# Patient Record
Sex: Female | Born: 1945 | Race: White | Hispanic: No | Smoking: Former smoker
Health system: Southern US, Community
[De-identification: ages and names within clinical notes are randomized; demographics above are authoritative.]

## PROBLEM LIST (undated history)

## (undated) DIAGNOSIS — J449 Chronic obstructive pulmonary disease, unspecified: Secondary | ICD-10-CM

## (undated) DIAGNOSIS — M1990 Unspecified osteoarthritis, unspecified site: Secondary | ICD-10-CM

## (undated) DIAGNOSIS — J189 Pneumonia, unspecified organism: Secondary | ICD-10-CM

## (undated) DIAGNOSIS — Z8719 Personal history of other diseases of the digestive system: Secondary | ICD-10-CM

## (undated) DIAGNOSIS — K219 Gastro-esophageal reflux disease without esophagitis: Secondary | ICD-10-CM

## (undated) DIAGNOSIS — C3490 Malignant neoplasm of unspecified part of unspecified bronchus or lung: Secondary | ICD-10-CM

## (undated) DIAGNOSIS — T7840XA Allergy, unspecified, initial encounter: Secondary | ICD-10-CM

## (undated) DIAGNOSIS — E785 Hyperlipidemia, unspecified: Secondary | ICD-10-CM

## (undated) DIAGNOSIS — K632 Fistula of intestine: Secondary | ICD-10-CM

## (undated) DIAGNOSIS — H4010X0 Unspecified open-angle glaucoma, stage unspecified: Secondary | ICD-10-CM

## (undated) DIAGNOSIS — G3184 Mild cognitive impairment, so stated: Secondary | ICD-10-CM

## (undated) DIAGNOSIS — F329 Major depressive disorder, single episode, unspecified: Secondary | ICD-10-CM

## (undated) DIAGNOSIS — F32A Depression, unspecified: Secondary | ICD-10-CM

## (undated) DIAGNOSIS — M19041 Primary osteoarthritis, right hand: Secondary | ICD-10-CM

## (undated) DIAGNOSIS — M19042 Primary osteoarthritis, left hand: Secondary | ICD-10-CM

## (undated) DIAGNOSIS — Z87820 Personal history of traumatic brain injury: Secondary | ICD-10-CM

## (undated) DIAGNOSIS — N179 Acute kidney failure, unspecified: Secondary | ICD-10-CM

## (undated) DIAGNOSIS — E118 Type 2 diabetes mellitus with unspecified complications: Secondary | ICD-10-CM

## (undated) DIAGNOSIS — D509 Iron deficiency anemia, unspecified: Secondary | ICD-10-CM

## (undated) DIAGNOSIS — N182 Chronic kidney disease, stage 2 (mild): Secondary | ICD-10-CM

## (undated) DIAGNOSIS — Z5189 Encounter for other specified aftercare: Secondary | ICD-10-CM

## (undated) DIAGNOSIS — E11319 Type 2 diabetes mellitus with unspecified diabetic retinopathy without macular edema: Secondary | ICD-10-CM

## (undated) DIAGNOSIS — F419 Anxiety disorder, unspecified: Secondary | ICD-10-CM

## (undated) DIAGNOSIS — I10 Essential (primary) hypertension: Secondary | ICD-10-CM

## (undated) DIAGNOSIS — I2510 Atherosclerotic heart disease of native coronary artery without angina pectoris: Secondary | ICD-10-CM

## (undated) DIAGNOSIS — I639 Cerebral infarction, unspecified: Secondary | ICD-10-CM

## (undated) DIAGNOSIS — N183 Chronic kidney disease, stage 3 unspecified: Secondary | ICD-10-CM

## (undated) DIAGNOSIS — K5720 Diverticulitis of large intestine with perforation and abscess without bleeding: Secondary | ICD-10-CM

## (undated) DIAGNOSIS — H2513 Age-related nuclear cataract, bilateral: Secondary | ICD-10-CM

## (undated) DIAGNOSIS — Z862 Personal history of diseases of the blood and blood-forming organs and certain disorders involving the immune mechanism: Secondary | ICD-10-CM

## (undated) DIAGNOSIS — I5181 Takotsubo syndrome: Secondary | ICD-10-CM

## (undated) DIAGNOSIS — D649 Anemia, unspecified: Secondary | ICD-10-CM

## (undated) DIAGNOSIS — J302 Other seasonal allergic rhinitis: Secondary | ICD-10-CM

## (undated) DIAGNOSIS — I447 Left bundle-branch block, unspecified: Secondary | ICD-10-CM

## (undated) DIAGNOSIS — M810 Age-related osteoporosis without current pathological fracture: Secondary | ICD-10-CM

## (undated) DIAGNOSIS — I472 Ventricular tachycardia: Secondary | ICD-10-CM

## (undated) DIAGNOSIS — E0590 Thyrotoxicosis, unspecified without thyrotoxic crisis or storm: Secondary | ICD-10-CM

## (undated) HISTORY — DX: Allergy, unspecified, initial encounter: T78.40XA

## (undated) HISTORY — DX: Thyrotoxicosis, unspecified without thyrotoxic crisis or storm: E05.90

## (undated) HISTORY — DX: Type 2 diabetes mellitus with unspecified complications: E11.8

## (undated) HISTORY — PX: APPENDECTOMY: SHX54

## (undated) HISTORY — DX: Primary osteoarthritis, right hand: M19.042

## (undated) HISTORY — DX: Chronic kidney disease, stage 2 (mild): N18.2

## (undated) HISTORY — DX: Pneumonia, unspecified organism: J18.9

## (undated) HISTORY — DX: Primary osteoarthritis, right hand: M19.041

## (undated) HISTORY — DX: Ventricular tachycardia: I47.2

## (undated) HISTORY — DX: Type 2 diabetes mellitus with unspecified diabetic retinopathy without macular edema: E11.319

## (undated) HISTORY — PX: COLONOSCOPY: SHX174

## (undated) HISTORY — DX: Fistula of intestine: K63.2

## (undated) HISTORY — DX: Hyperlipidemia, unspecified: E78.5

## (undated) HISTORY — DX: Malignant neoplasm of unspecified part of unspecified bronchus or lung: C34.90

## (undated) HISTORY — DX: Anxiety disorder, unspecified: F41.9

## (undated) HISTORY — DX: Other seasonal allergic rhinitis: J30.2

## (undated) HISTORY — DX: Left bundle-branch block, unspecified: I44.7

## (undated) HISTORY — DX: Gastro-esophageal reflux disease without esophagitis: K21.9

## (undated) HISTORY — DX: Chronic kidney disease, stage 3 unspecified: N18.30

## (undated) HISTORY — DX: Encounter for other specified aftercare: Z51.89

## (undated) HISTORY — DX: Anemia, unspecified: D64.9

## (undated) HISTORY — DX: Unspecified open-angle glaucoma, stage unspecified: H40.10X0

## (undated) HISTORY — PX: CATARACT EXTRACTION: SUR2

## (undated) HISTORY — DX: Cerebral infarction, unspecified: I63.9

## (undated) HISTORY — DX: Depression, unspecified: F32.A

## (undated) HISTORY — DX: Unspecified osteoarthritis, unspecified site: M19.90

## (undated) HISTORY — DX: Mild cognitive impairment of uncertain or unknown etiology: G31.84

## (undated) HISTORY — PX: CARPAL TUNNEL RELEASE: SHX101

## (undated) HISTORY — DX: Age-related nuclear cataract, bilateral: H25.13

## (undated) HISTORY — DX: Personal history of traumatic brain injury: Z87.820

## (undated) HISTORY — DX: Takotsubo syndrome: I51.81

## (undated) HISTORY — DX: Personal history of diseases of the blood and blood-forming organs and certain disorders involving the immune mechanism: Z86.2

---
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
## 1995-10-25 HISTORY — PX: ABDOMINAL HYSTERECTOMY: SHX81

## 1999-02-05 ENCOUNTER — Encounter: Payer: Self-pay | Admitting: Family Medicine

## 1999-02-05 ENCOUNTER — Ambulatory Visit (HOSPITAL_COMMUNITY): Admission: RE | Admit: 1999-02-05 | Discharge: 1999-02-05 | Payer: Self-pay

## 1999-12-31 ENCOUNTER — Emergency Department (HOSPITAL_COMMUNITY): Admission: EM | Admit: 1999-12-31 | Discharge: 2000-01-01 | Payer: Self-pay | Admitting: *Deleted

## 2000-02-11 ENCOUNTER — Ambulatory Visit (HOSPITAL_COMMUNITY): Admission: RE | Admit: 2000-02-11 | Discharge: 2000-02-11 | Payer: Self-pay | Admitting: *Deleted

## 2000-10-24 HISTORY — PX: COLONOSCOPY: SHX174

## 2000-10-24 HISTORY — PX: CHOLECYSTECTOMY: SHX55

## 2000-10-24 LAB — HM COLONOSCOPY: HM Colonoscopy: NORMAL

## 2000-12-30 ENCOUNTER — Encounter (INDEPENDENT_AMBULATORY_CARE_PROVIDER_SITE_OTHER): Payer: Self-pay

## 2000-12-30 ENCOUNTER — Encounter: Payer: Self-pay | Admitting: Internal Medicine

## 2000-12-30 ENCOUNTER — Encounter: Payer: Self-pay | Admitting: General Surgery

## 2000-12-30 ENCOUNTER — Observation Stay (HOSPITAL_COMMUNITY): Admission: EM | Admit: 2000-12-30 | Discharge: 2000-12-31 | Payer: Self-pay | Admitting: Internal Medicine

## 2001-05-06 ENCOUNTER — Emergency Department (HOSPITAL_COMMUNITY): Admission: EM | Admit: 2001-05-06 | Discharge: 2001-05-06 | Payer: Self-pay | Admitting: Emergency Medicine

## 2001-05-06 ENCOUNTER — Encounter: Payer: Self-pay | Admitting: Emergency Medicine

## 2001-08-09 ENCOUNTER — Observation Stay (HOSPITAL_COMMUNITY): Admission: AD | Admit: 2001-08-09 | Discharge: 2001-08-10 | Payer: Self-pay | Admitting: Cardiology

## 2001-08-09 ENCOUNTER — Encounter: Payer: Self-pay | Admitting: Emergency Medicine

## 2001-12-19 ENCOUNTER — Encounter: Admission: RE | Admit: 2001-12-19 | Discharge: 2001-12-19 | Payer: Self-pay | Admitting: *Deleted

## 2002-01-06 ENCOUNTER — Emergency Department (HOSPITAL_COMMUNITY): Admission: EM | Admit: 2002-01-06 | Discharge: 2002-01-07 | Payer: Self-pay | Admitting: *Deleted

## 2002-03-21 ENCOUNTER — Encounter: Admission: RE | Admit: 2002-03-21 | Discharge: 2002-03-21 | Payer: Self-pay | Admitting: *Deleted

## 2002-05-19 ENCOUNTER — Encounter: Payer: Self-pay | Admitting: Emergency Medicine

## 2002-05-19 ENCOUNTER — Emergency Department (HOSPITAL_COMMUNITY): Admission: EM | Admit: 2002-05-19 | Discharge: 2002-05-19 | Payer: Self-pay | Admitting: Emergency Medicine

## 2002-07-29 ENCOUNTER — Ambulatory Visit (HOSPITAL_COMMUNITY): Admission: RE | Admit: 2002-07-29 | Discharge: 2002-07-29 | Payer: Self-pay | Admitting: *Deleted

## 2002-11-09 IMAGING — CT CT HEAD W/O CM
1 of 2 series · 13 of 30 positions shown, 17 images · IV contrast (agent unspecified)
Comparison: none

FINDINGS
CLINICAL DATA: RIGHT SIDED FACIAL NUMBNESS; HEADACHE.
CRANIAL CT WITHOUT CONTRAST:
ROUTINE NONCONTRAST HEAD CT WAS PERFORMED.  THERE IS NO EVIDENCE OF INTRACRANIAL HEMORRHAGE, BRAIN
EDEMA, OR MASS EFFECT.  THE VENTRICLES ARE NORMAL.  NO EXTRA-AXIAL ABNORMALITIES ARE IDENTIFIED.
BONE WINDOWS SHOW NO SIGNIFICANT ABNORMALITIES.
IMPRESSION
NEGATIVE NONCONTRAST CRANIAL CT.

[Series 2: — · axial · 0.39mm/px · z∈[-179,-64]mm · 13 of 27 slices shown, 17 images]
[im 2/27  brain]
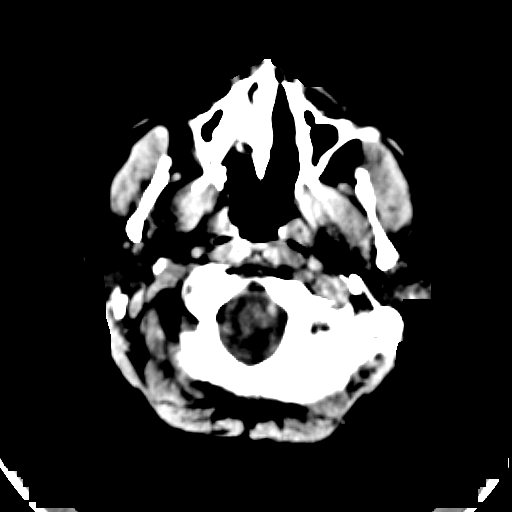
[im 2/27  bone]
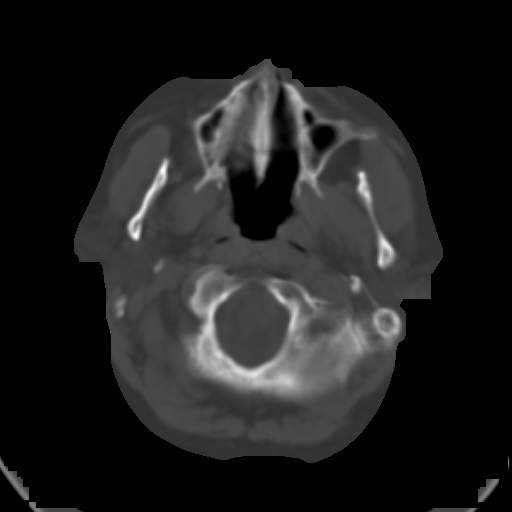
[im 4/27  brain]
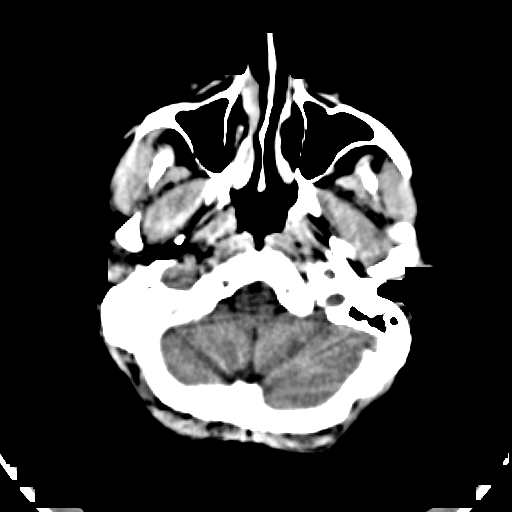
[im 6/27  brain]
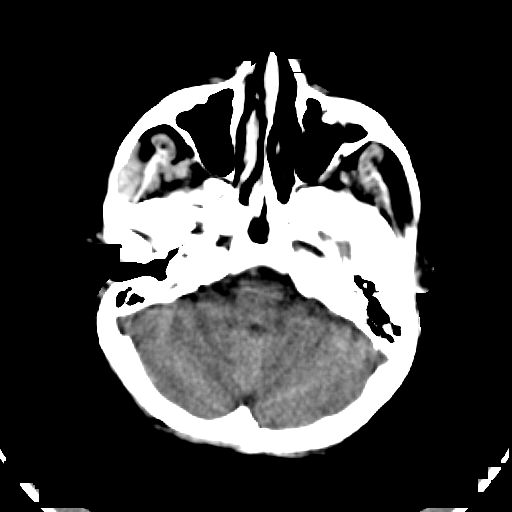
[im 8/27  brain]
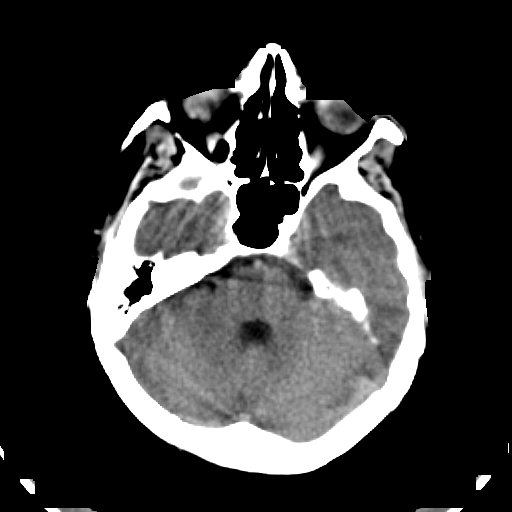
[im 10/27  brain]
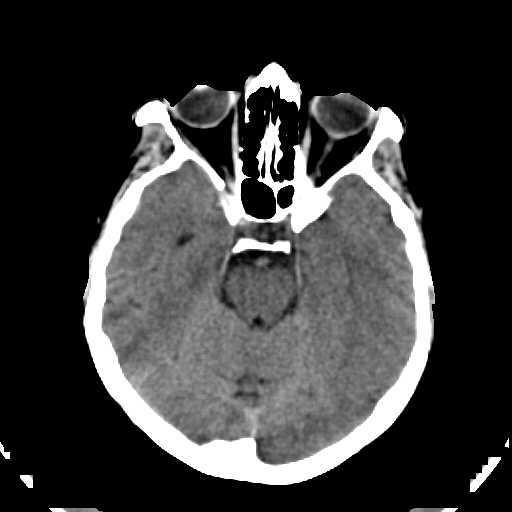
[im 10/27  bone]
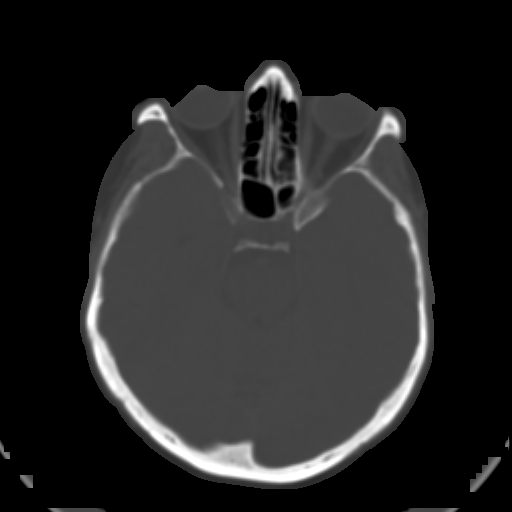
[im 12/27  brain]
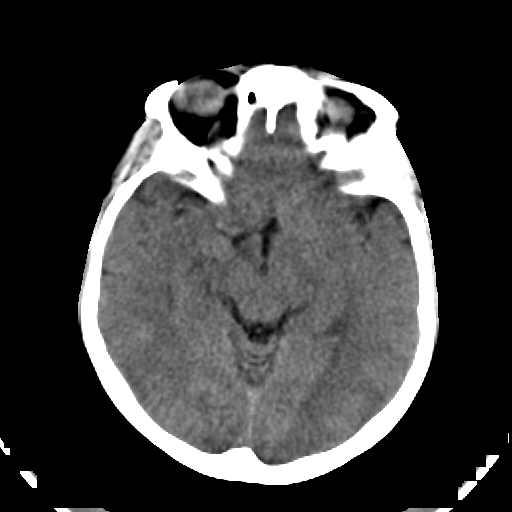
[im 14/27  brain]
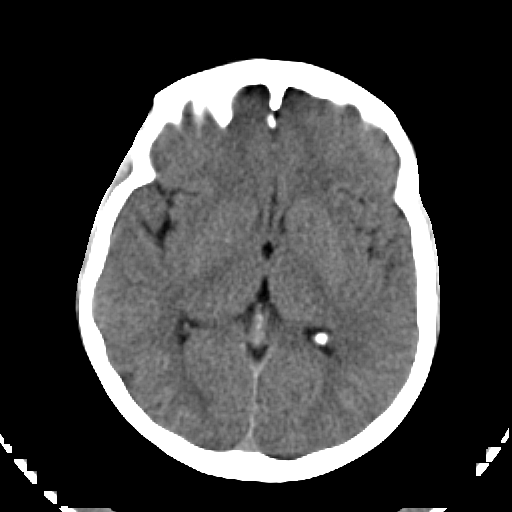
[im 15/27  brain]
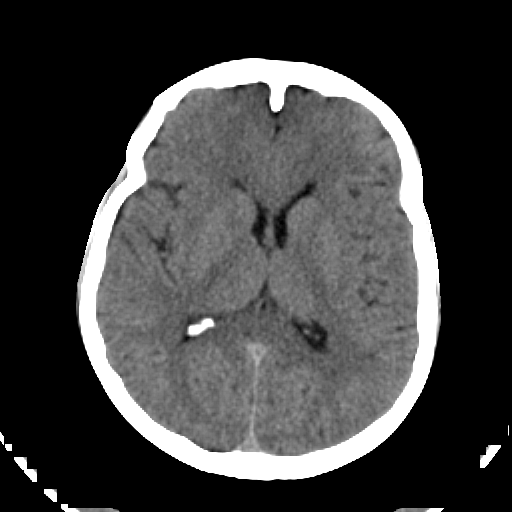
[im 17/27  brain]
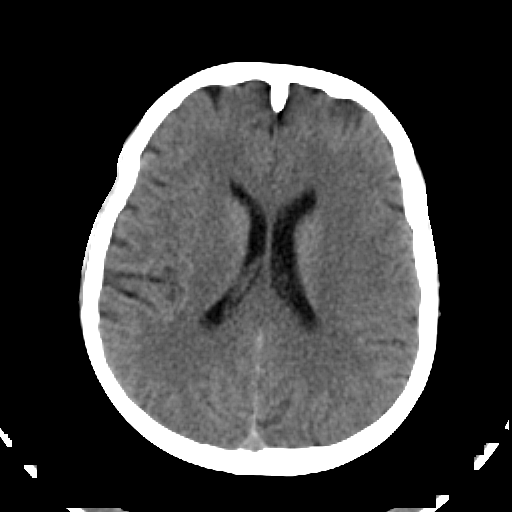
[im 17/27  bone]
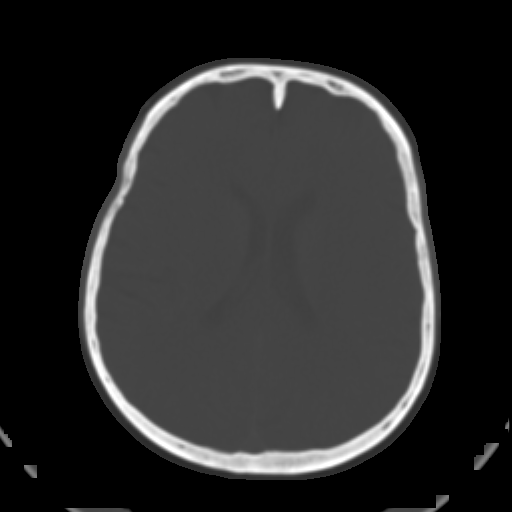
[im 19/27  brain]
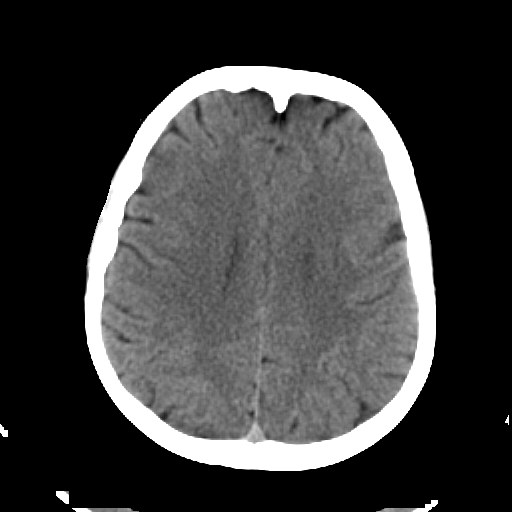
[im 21/27  brain]
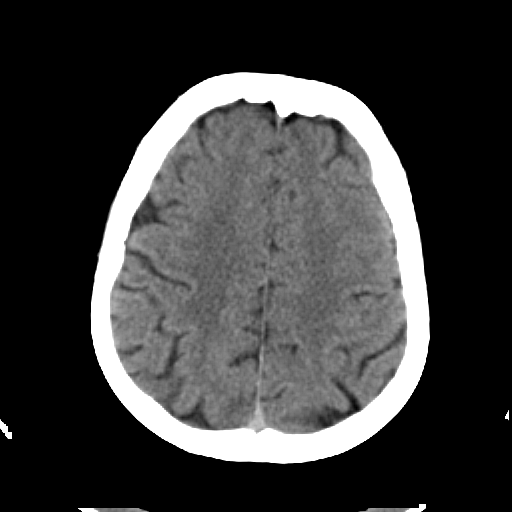
[im 23/27  brain]
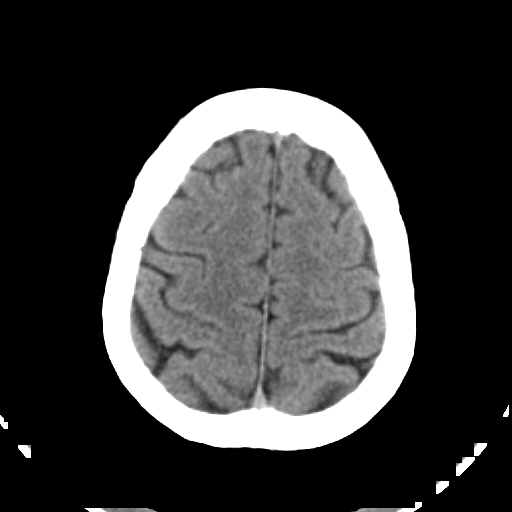
[im 25/27  brain]
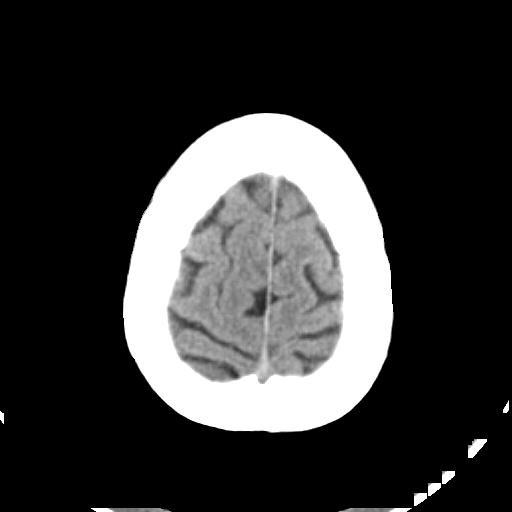
[im 25/27  bone]
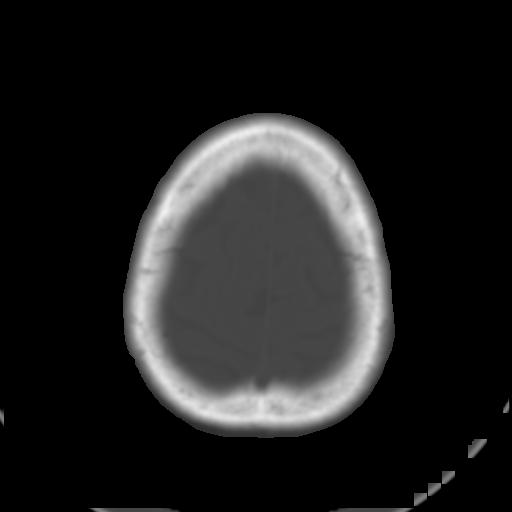

[13 of 30 positions shown; findings below may reference images not displayed]

## 2003-03-26 ENCOUNTER — Encounter: Payer: Self-pay | Admitting: Family Medicine

## 2003-03-26 ENCOUNTER — Encounter: Admission: RE | Admit: 2003-03-26 | Discharge: 2003-03-26 | Payer: Self-pay | Admitting: Family Medicine

## 2004-03-27 ENCOUNTER — Emergency Department (HOSPITAL_COMMUNITY): Admission: EM | Admit: 2004-03-27 | Discharge: 2004-03-27 | Payer: Self-pay | Admitting: Emergency Medicine

## 2004-05-21 ENCOUNTER — Encounter: Admission: RE | Admit: 2004-05-21 | Discharge: 2004-05-21 | Payer: Self-pay | Admitting: Internal Medicine

## 2004-09-03 ENCOUNTER — Ambulatory Visit: Payer: Self-pay | Admitting: Internal Medicine

## 2004-09-10 ENCOUNTER — Ambulatory Visit: Payer: Self-pay | Admitting: Internal Medicine

## 2004-09-22 ENCOUNTER — Encounter: Admission: RE | Admit: 2004-09-22 | Discharge: 2004-12-21 | Payer: Self-pay | Admitting: Internal Medicine

## 2004-10-22 ENCOUNTER — Emergency Department (HOSPITAL_COMMUNITY): Admission: EM | Admit: 2004-10-22 | Discharge: 2004-10-22 | Payer: Self-pay | Admitting: Emergency Medicine

## 2004-10-22 ENCOUNTER — Ambulatory Visit: Payer: Self-pay | Admitting: Internal Medicine

## 2004-10-29 ENCOUNTER — Ambulatory Visit: Payer: Self-pay | Admitting: Internal Medicine

## 2004-12-02 ENCOUNTER — Ambulatory Visit: Payer: Self-pay | Admitting: Internal Medicine

## 2004-12-09 ENCOUNTER — Ambulatory Visit: Payer: Self-pay | Admitting: Internal Medicine

## 2005-01-28 IMAGING — CR DG CHEST 1V PORT
1 series · 1 of 1 positions shown · non-contrast
Comparison: none

CLINICAL DATA: Chest pain and nausea. 
 PORTABLE CHEST, SINGLE VIEW ? 03/27/04 (6664 HOURS)
 No prior studies for comparison. 
 No evidence of infiltrate, edema or pleural fluid.  Minimal subsegmental atelectasis at the right lung base. Heart size is normal.  
 IMPRESSION
 No active disease. Minimal right basilar atelectasis.

[view not recorded]
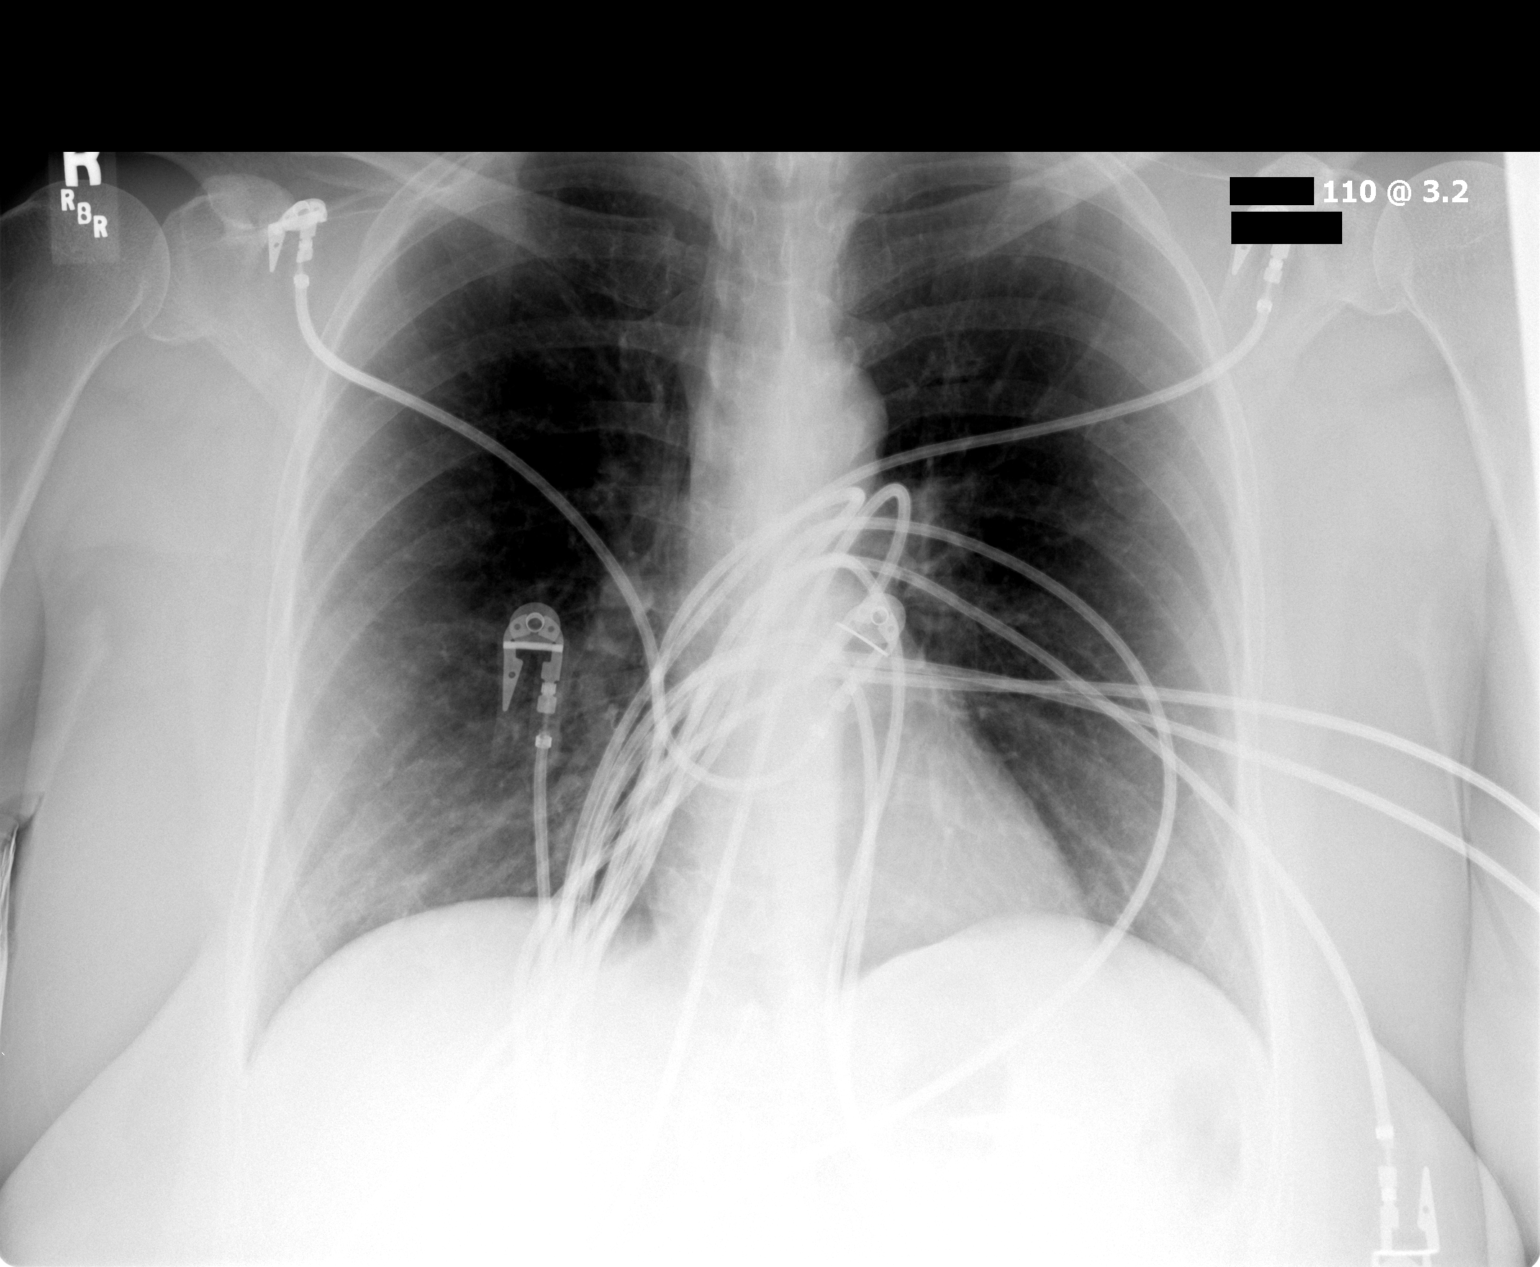

[1 of 1 positions shown; findings below may reference images not displayed]

## 2005-03-11 ENCOUNTER — Ambulatory Visit: Payer: Self-pay | Admitting: Internal Medicine

## 2005-03-18 ENCOUNTER — Ambulatory Visit: Payer: Self-pay | Admitting: Internal Medicine

## 2005-05-06 ENCOUNTER — Ambulatory Visit: Payer: Self-pay | Admitting: Internal Medicine

## 2005-05-13 ENCOUNTER — Ambulatory Visit: Payer: Self-pay | Admitting: Internal Medicine

## 2005-07-01 ENCOUNTER — Emergency Department (HOSPITAL_COMMUNITY): Admission: EM | Admit: 2005-07-01 | Discharge: 2005-07-01 | Payer: Self-pay | Admitting: Emergency Medicine

## 2005-07-08 ENCOUNTER — Ambulatory Visit: Payer: Self-pay | Admitting: Internal Medicine

## 2005-07-18 ENCOUNTER — Ambulatory Visit: Payer: Self-pay | Admitting: Internal Medicine

## 2005-07-22 ENCOUNTER — Ambulatory Visit: Payer: Self-pay

## 2005-07-22 ENCOUNTER — Encounter: Payer: Self-pay | Admitting: Internal Medicine

## 2005-07-27 ENCOUNTER — Ambulatory Visit: Payer: Self-pay | Admitting: Internal Medicine

## 2005-08-01 ENCOUNTER — Ambulatory Visit: Payer: Self-pay | Admitting: Cardiology

## 2005-08-04 ENCOUNTER — Ambulatory Visit: Payer: Self-pay | Admitting: Cardiology

## 2005-08-04 ENCOUNTER — Inpatient Hospital Stay (HOSPITAL_BASED_OUTPATIENT_CLINIC_OR_DEPARTMENT_OTHER): Admission: RE | Admit: 2005-08-04 | Discharge: 2005-08-04 | Payer: Self-pay | Admitting: Cardiology

## 2005-08-09 ENCOUNTER — Ambulatory Visit: Payer: Self-pay | Admitting: Internal Medicine

## 2005-09-15 ENCOUNTER — Emergency Department (HOSPITAL_COMMUNITY): Admission: EM | Admit: 2005-09-15 | Discharge: 2005-09-16 | Payer: Self-pay | Admitting: Emergency Medicine

## 2005-09-19 ENCOUNTER — Ambulatory Visit: Payer: Self-pay | Admitting: Internal Medicine

## 2005-09-28 ENCOUNTER — Ambulatory Visit: Payer: Self-pay | Admitting: Internal Medicine

## 2005-09-30 ENCOUNTER — Ambulatory Visit: Payer: Self-pay | Admitting: Gastroenterology

## 2005-10-14 ENCOUNTER — Ambulatory Visit: Payer: Self-pay | Admitting: Gastroenterology

## 2005-10-26 ENCOUNTER — Ambulatory Visit: Payer: Self-pay | Admitting: Gastroenterology

## 2005-10-28 ENCOUNTER — Ambulatory Visit (HOSPITAL_COMMUNITY): Admission: RE | Admit: 2005-10-28 | Discharge: 2005-10-28 | Payer: Self-pay | Admitting: Gastroenterology

## 2005-11-08 ENCOUNTER — Ambulatory Visit: Payer: Self-pay | Admitting: Gastroenterology

## 2005-11-11 ENCOUNTER — Ambulatory Visit: Payer: Self-pay | Admitting: Internal Medicine

## 2005-11-11 ENCOUNTER — Ambulatory Visit (HOSPITAL_COMMUNITY): Admission: RE | Admit: 2005-11-11 | Discharge: 2005-11-11 | Payer: Self-pay | Admitting: Gastroenterology

## 2005-11-28 ENCOUNTER — Ambulatory Visit: Payer: Self-pay | Admitting: Internal Medicine

## 2005-12-02 ENCOUNTER — Ambulatory Visit: Payer: Self-pay | Admitting: Internal Medicine

## 2005-12-26 ENCOUNTER — Ambulatory Visit: Payer: Self-pay | Admitting: Gastroenterology

## 2006-01-03 ENCOUNTER — Ambulatory Visit: Payer: Self-pay | Admitting: Internal Medicine

## 2006-01-13 ENCOUNTER — Ambulatory Visit: Payer: Self-pay | Admitting: Internal Medicine

## 2006-02-24 ENCOUNTER — Ambulatory Visit: Payer: Self-pay | Admitting: Internal Medicine

## 2006-03-03 ENCOUNTER — Ambulatory Visit: Payer: Self-pay | Admitting: Internal Medicine

## 2006-04-14 ENCOUNTER — Ambulatory Visit: Payer: Self-pay | Admitting: Internal Medicine

## 2006-05-04 IMAGING — CT CT ABDOMEN W/ CM
1 of 3 series · 14 of 32 positions shown, 19 images · IV contrast (APPLIED)
Comparison: none

CLINICAL DATA: Right lower abdominal and pelvic pain.   Diarrhea.  Suspect appendicitis.  
ABDOMEN CT WITH CONTRAST:
TECHNIQUE: Multidetector CT imaging of the abdomen was performed following the standard protocol during bolus administration of intravenous contrast.
Contrast:  100 cc Omnipaque 300
Surgical clips are seen from prior cholecystectomy.   There is no evidence of biliary ductal dilatation.   Mild diffuse fatty infiltration of the liver is noted.  No focal liver lesions are seen. 
The pancreas, spleen, adrenal glands, and kidneys are normal in appearance.   There is no evidence of mass or adenopathy.  There is no evidence of inflammatory process or abnormal fluid collections within the abdomen.
TECHNIQUE: Multidetector CT imaging of the pelvis was performed following the standard protocol during bolus administration of intravenous contrast.
The appendix is seen and is normal in appearance.   There is no evidence of inflammatory process or abnormal fluid collections within the pelvis.   No mass or adenopathy identified.  Patient has undergone previous hysterectomy.

[Series 2: abd/pelv with 5.0 b31f st · axial · 0.74mm/px · z∈[-629,-254]mm · 14 of 85 slices shown, 19 images]
[im 5/85  soft-tissue]
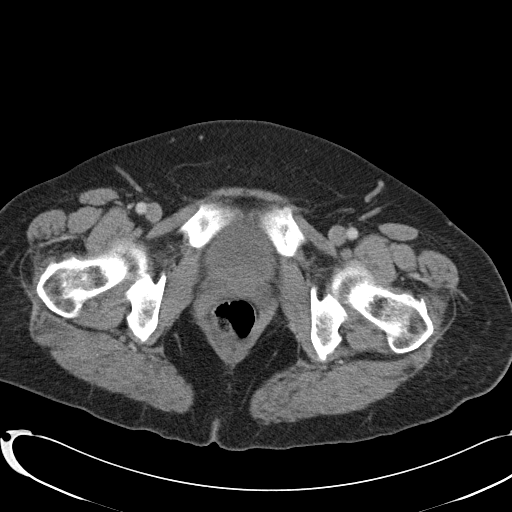
[im 5/85  bone]
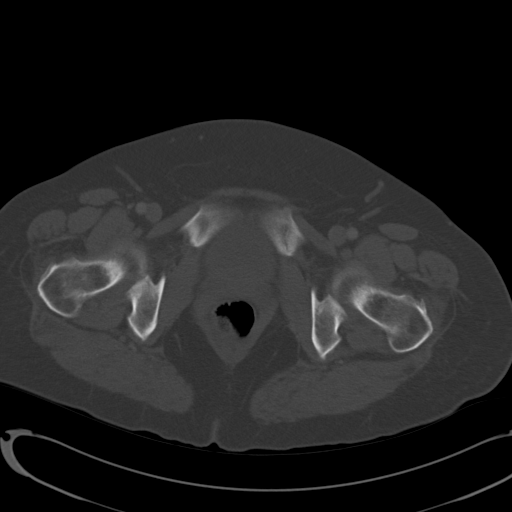
[im 14/85  soft-tissue]
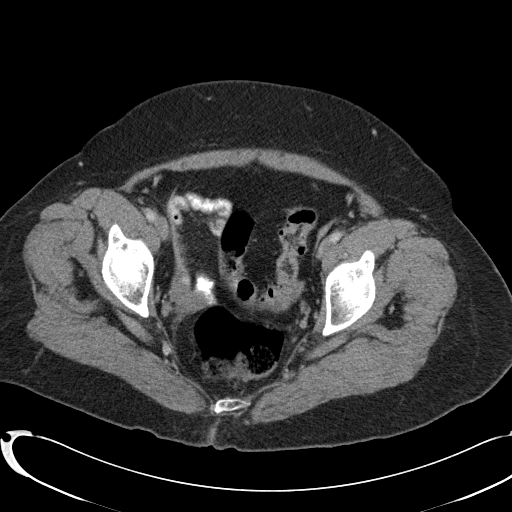
[im 18/85  soft-tissue]
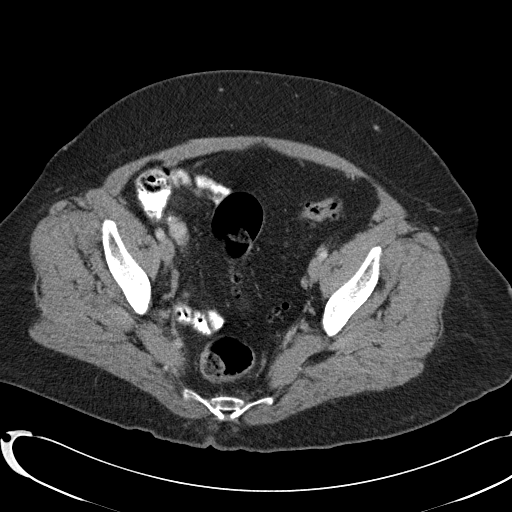
[im 23/85  soft-tissue]
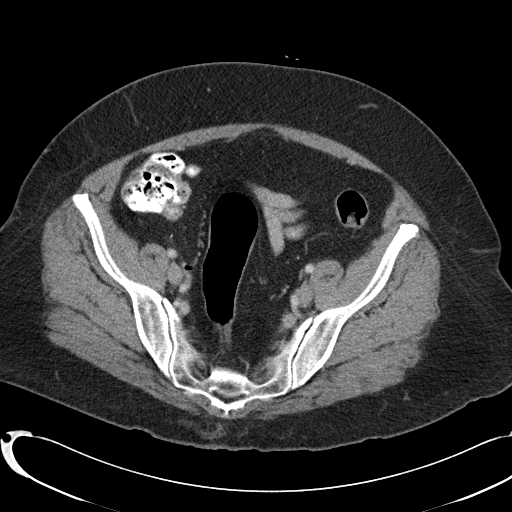
[im 31/85  soft-tissue]
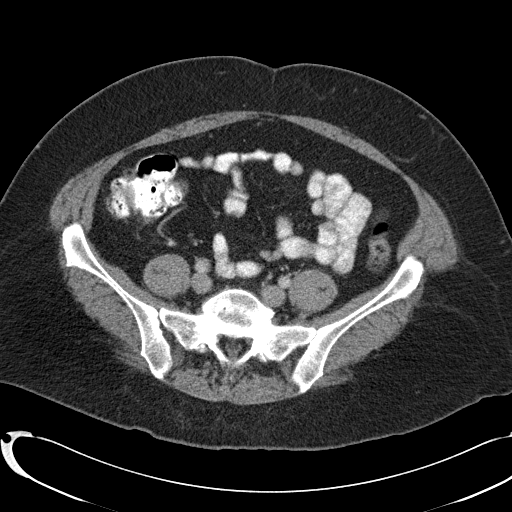
[im 36/85  soft-tissue]
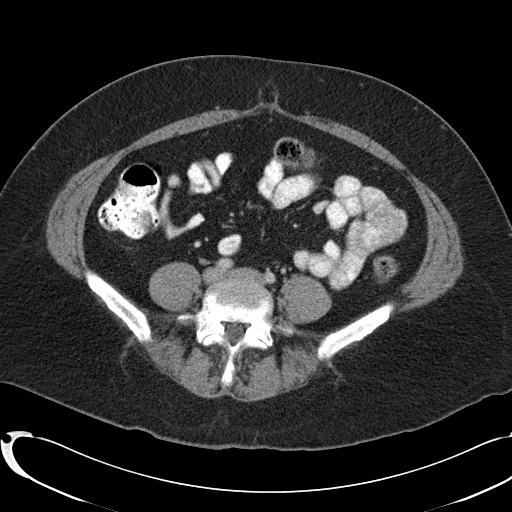
[im 45/85  soft-tissue]
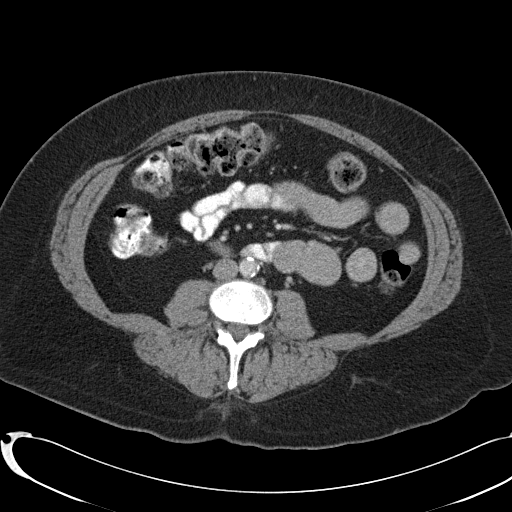
[im 49/85  soft-tissue]
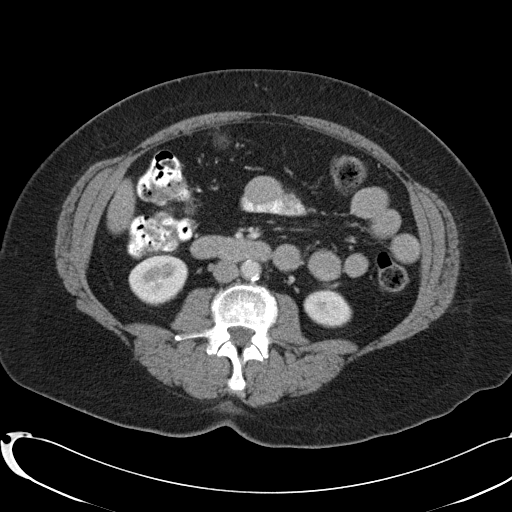
[im 54/85  soft-tissue]
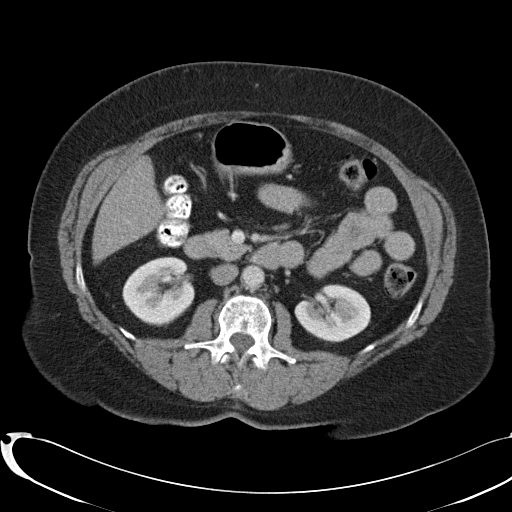
[im 54/85  bone]
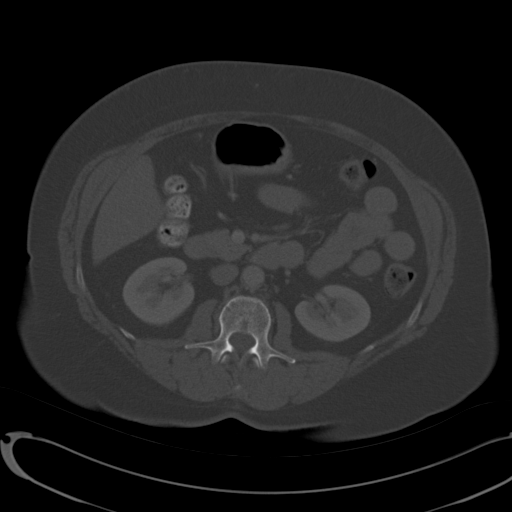
[im 62/85  soft-tissue]
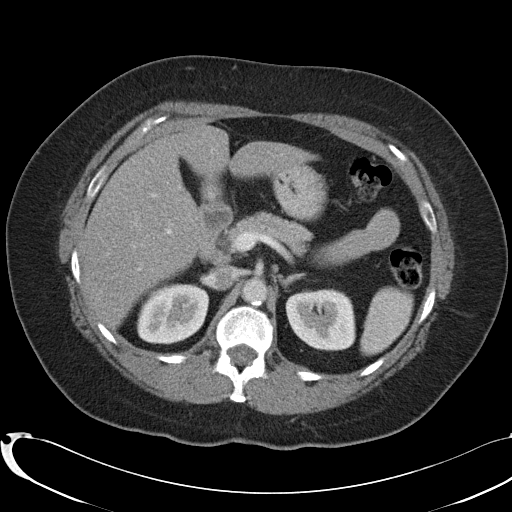
[im 67/85  soft-tissue]
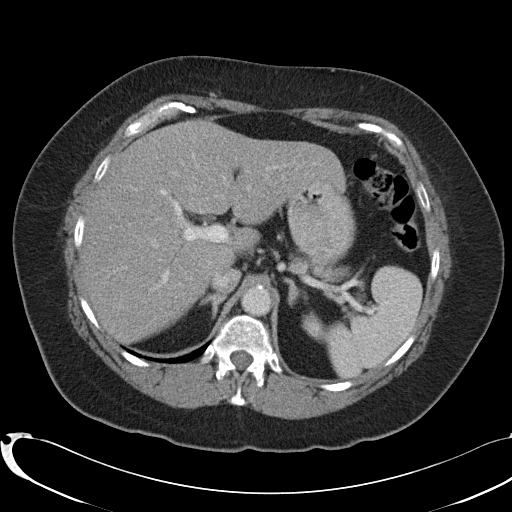
[im 67/85  lung]
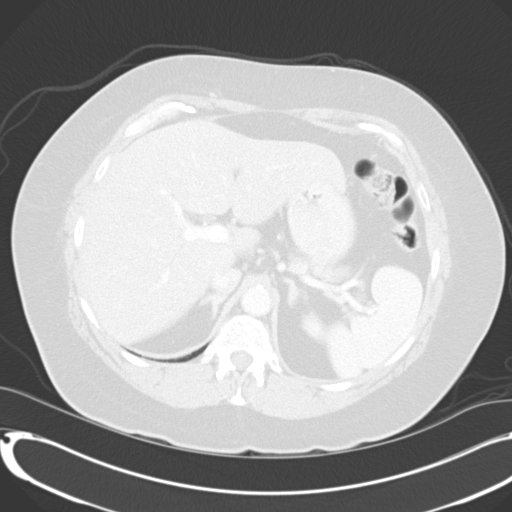
[im 71/85  soft-tissue]
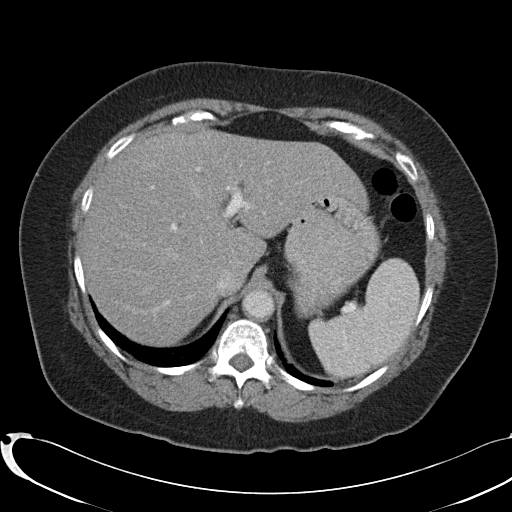
[im 71/85  lung]
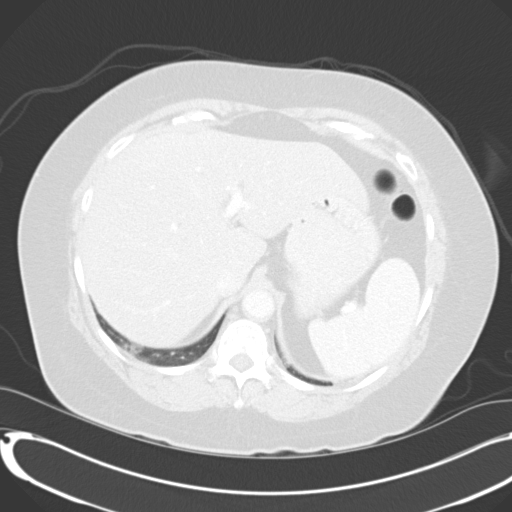
[im 76/85  lung]
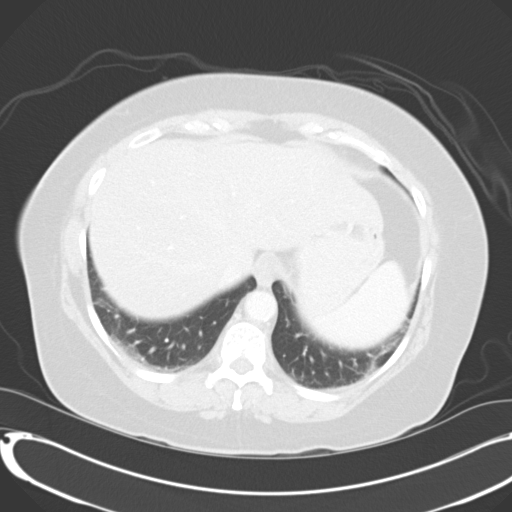
[im 80/85  soft-tissue]
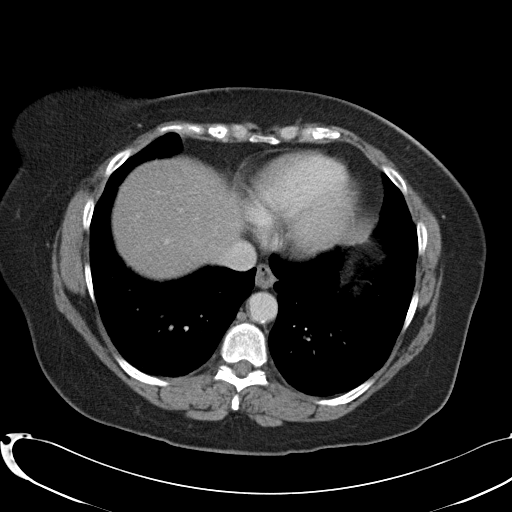
[im 80/85  lung]
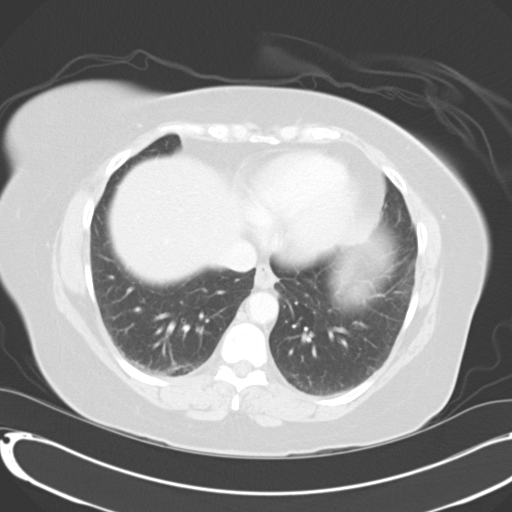

[14 of 32 positions shown; findings below may reference images not displayed]

IMPRESSION: 1.  No acute findings.  
2.  Mild diffuse fatty infiltration of the liver.   
PELVIS CT WITH CONTRAST:
IMPRESSION: Negative pelvis CT.  No evidence of appendicitis or other acute findings.

## 2006-05-29 ENCOUNTER — Ambulatory Visit: Payer: Self-pay | Admitting: Internal Medicine

## 2006-06-20 ENCOUNTER — Ambulatory Visit: Payer: Self-pay | Admitting: Internal Medicine

## 2006-07-17 ENCOUNTER — Ambulatory Visit: Payer: Self-pay | Admitting: Internal Medicine

## 2006-07-20 IMAGING — CT CT PELVIS W/ CM
2 of 5 series · 17 of 46 positions shown, 19 images · IV contrast (omnipaque)
Comparison: 07/01/2005

ABDOMEN CT WITH CONTRAST

CLINICAL DATA: Right-sided abdominal pain for months
TECHNIQUE: Multidetector CT imaging of the abdomen and pelvis was performed
following the standard protocol during bolus administration of intravenous
contrast.

Contrast:  100 cc Omnipaque 300

[Series 2: abd_pel 5.0 b40f st · axial · 0.65mm/px · z∈[-346,+64]mm · 14 of 92 slices shown, 16 images]
[im 5/92  soft-tissue]
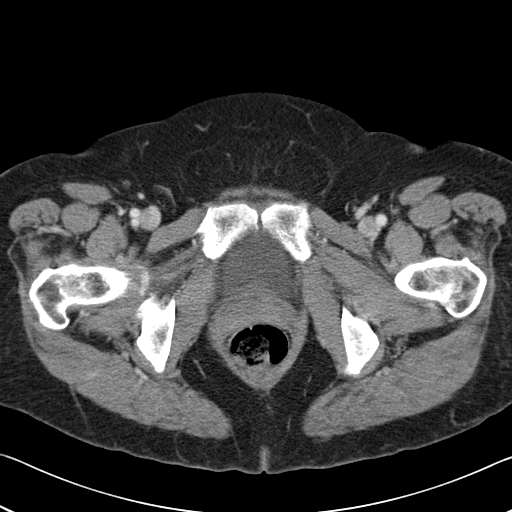
[im 5/92  bone]
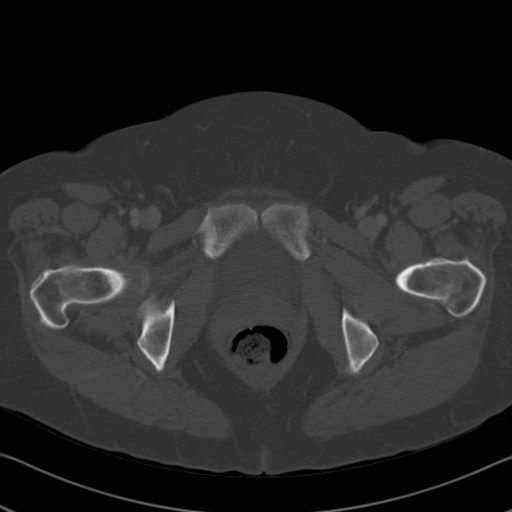
[im 14/92  soft-tissue]
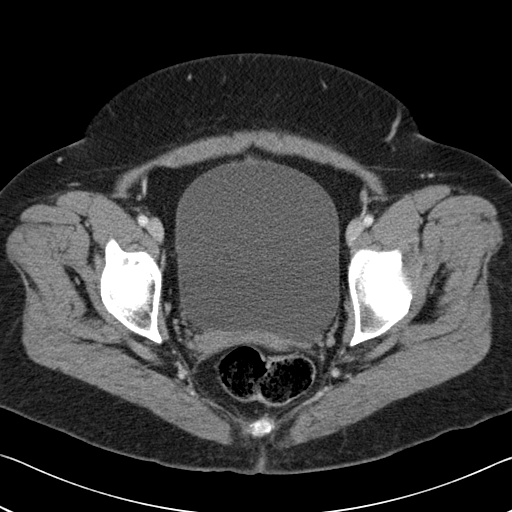
[im 19/92  soft-tissue]
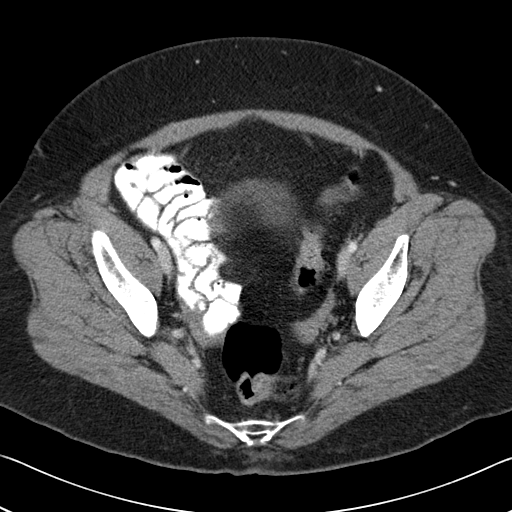
[im 23/92  soft-tissue]
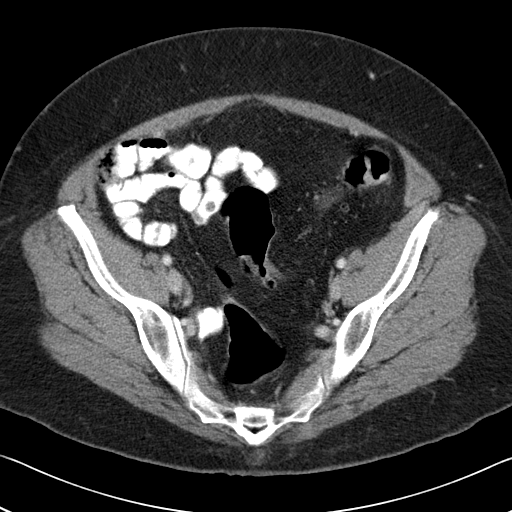
[im 32/92  soft-tissue]
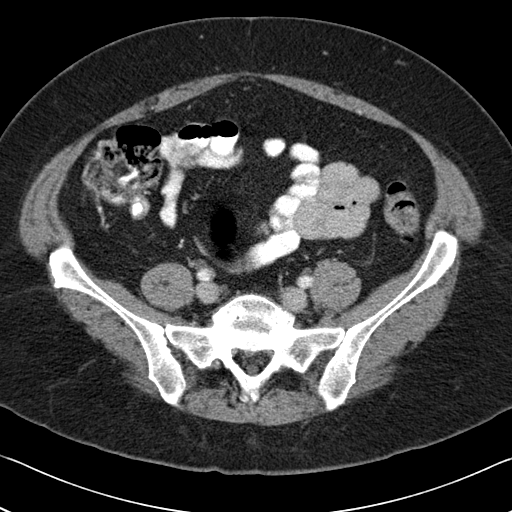
[im 37/92  soft-tissue]
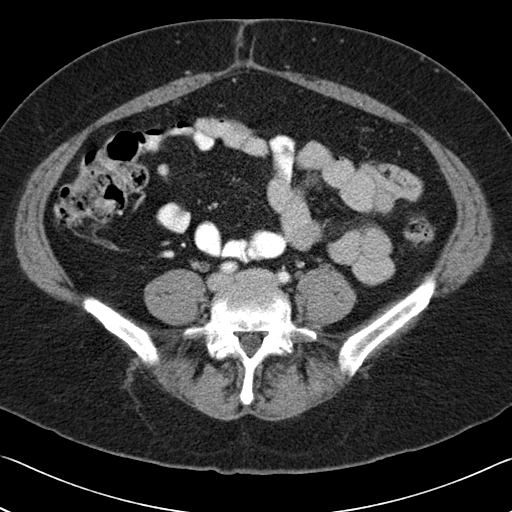
[im 41/92  soft-tissue]
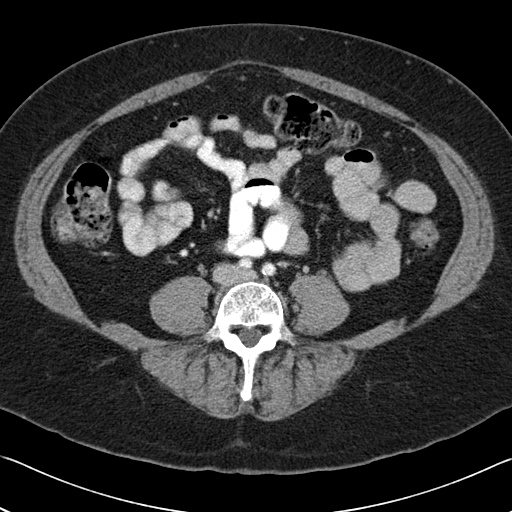
[im 51/92  soft-tissue]
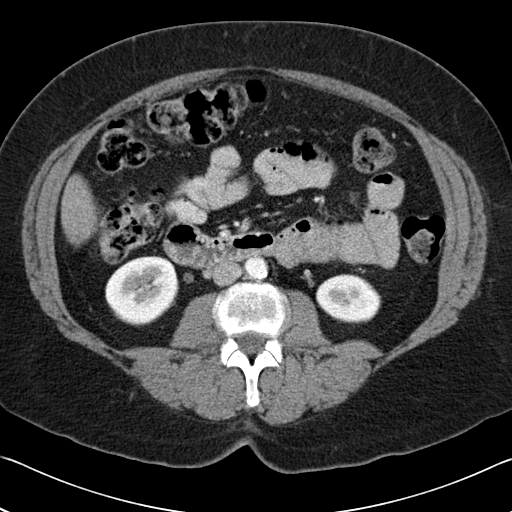
[im 55/92  soft-tissue]
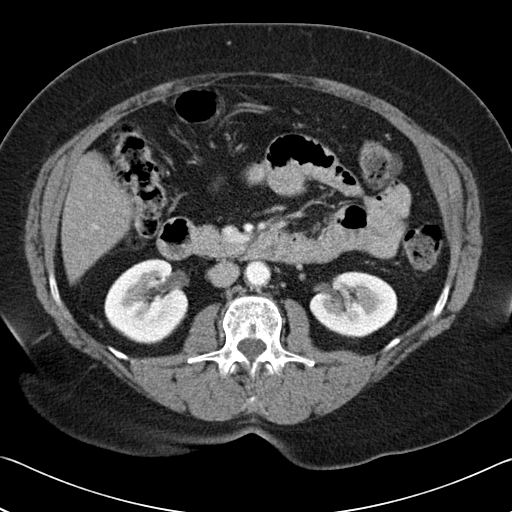
[im 55/92  bone]
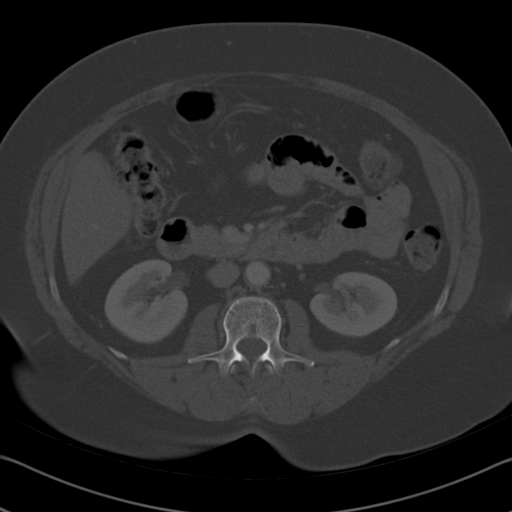
[im 60/92  soft-tissue]
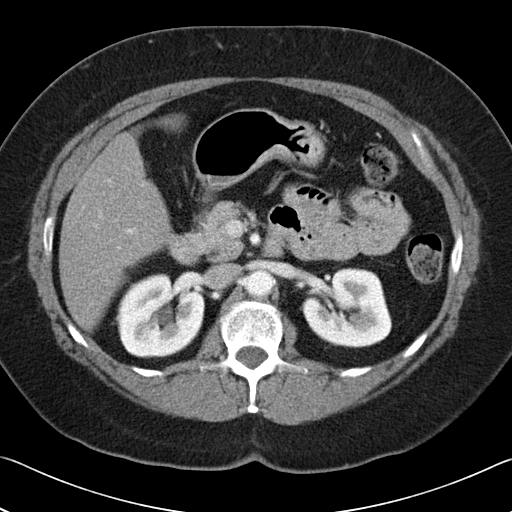
[im 69/92  soft-tissue]
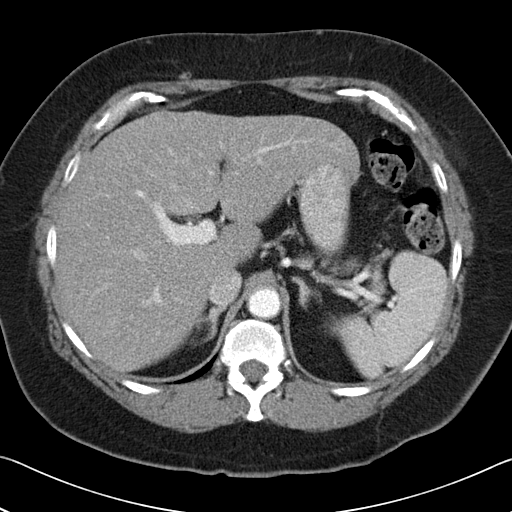
[im 73/92  soft-tissue]
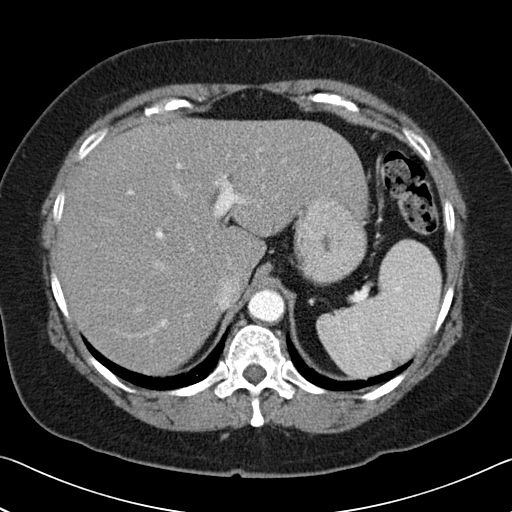
[im 78/92  soft-tissue]
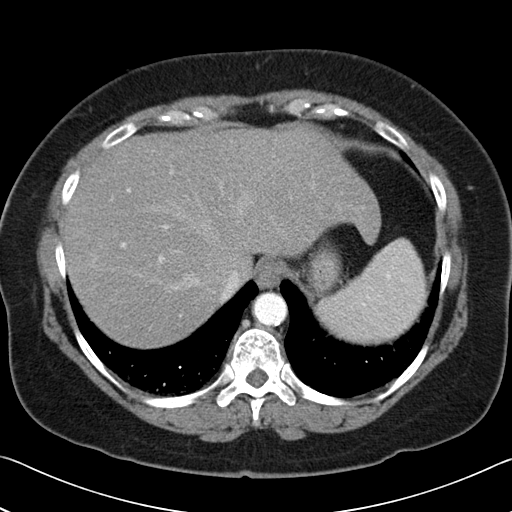
[im 87/92  soft-tissue]
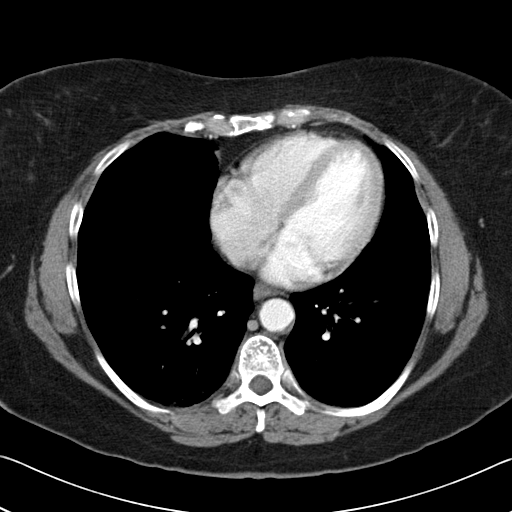

[Series 603: <mpr thick range(1)> · coronal · 0.93mm/px · 3 of 46 slices shown]
[im 16/46  soft-tissue]
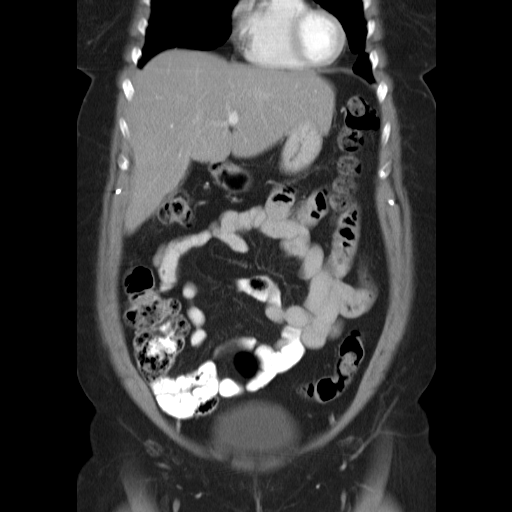
[im 21/46  soft-tissue]
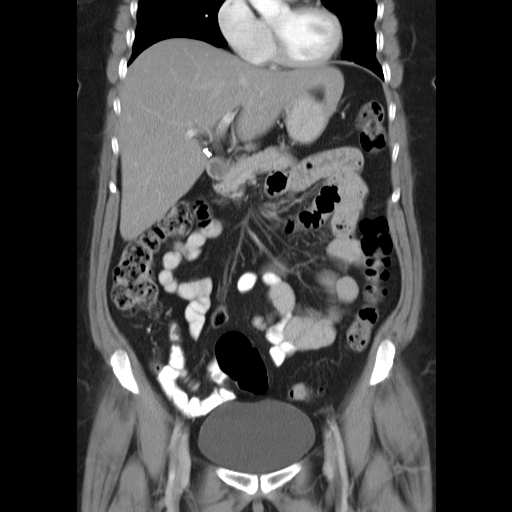
[im 26/46  soft-tissue]
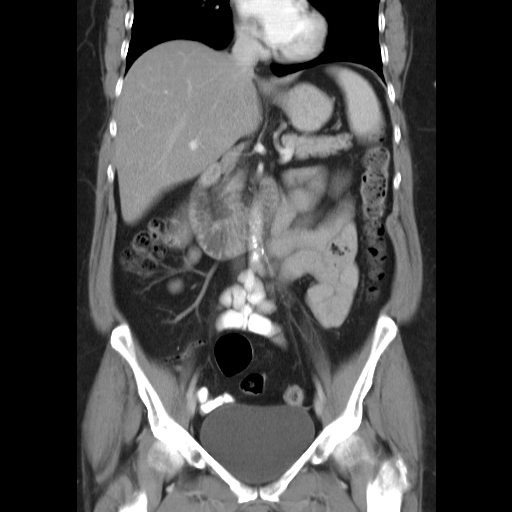

[17 of 46 positions shown; findings below may reference images not displayed]

FINDINGS: Patient is status post cholecystectomy. There is diffuse fatty
infiltration of the liver. Small cysts in the left kidney. Otherwise solid
organs unremarkable. No free fluid, free air, or adenopathy. Bowel unremarkable.
Atherosclerotic calcifications in the abdominal aorta.

IMPRESSION

Cholecystectomy. Fatty liver. No acute findings.

PELVIS CT WITH CONTRAST
FINDINGS: There are 3 rounded filling defect seen within small bowel loops in
the pelvis. Although these may represent food material or ingested tablets, its
difficult to exclude small bowel polyps. These areas of bowel were not well
distended or opacified on prior study.

There is sigmoid diverticulosis. No evidence of active diverticulitis. No free
fluid, free air, or adenopathy.

IMPRESSION

Three rounded filling defects within pelvic small bowel loops, ingested material
versus small bowel polyps. These can be further evaluated either with capsule
endoscopy or small bowel follow-through. 

Sigmoid diverticulosis.

## 2006-08-15 ENCOUNTER — Ambulatory Visit: Payer: Self-pay | Admitting: Internal Medicine

## 2006-08-15 LAB — CONVERTED CEMR LAB
ALT: 23 units/L (ref 0–40)
AST: 22 units/L (ref 0–37)
Albumin: 4.2 g/dL (ref 3.5–5.2)
Alkaline Phosphatase: 105 units/L (ref 39–117)
BUN: 14 mg/dL (ref 6–23)
Bilirubin, Direct: 0.1 mg/dL (ref 0.0–0.3)
CO2: 32 meq/L (ref 19–32)
Calcium: 10 mg/dL (ref 8.4–10.5)
Chloride: 106 meq/L (ref 96–112)
Chol/HDL Ratio, serum: 10.9
Cholesterol: 284 mg/dL (ref 0–200)
Creatinine, Ser: 0.8 mg/dL (ref 0.4–1.2)
GFR calc non Af Amer: 78 mL/min
Glomerular Filtration Rate, Af Am: 95 mL/min/{1.73_m2}
Glucose, Bld: 141 mg/dL — ABNORMAL HIGH (ref 70–99)
HDL: 26.1 mg/dL — ABNORMAL LOW (ref >39.0)
Hgb A1c MFr Bld: 8 % — ABNORMAL HIGH (ref 4.6–6.0)
LDL DIRECT: 192.4 mg/dL
Potassium: 5.2 meq/L — ABNORMAL HIGH (ref 3.5–5.1)
Sodium: 144 meq/L (ref 135–145)
Total Bilirubin: 0.6 mg/dL (ref 0.3–1.2)
Total Protein: 6.9 g/dL (ref 6.0–8.3)
Triglyceride fasting, serum: 327 mg/dL (ref 0–149)
VLDL: 65 mg/dL — ABNORMAL HIGH (ref 0–40)

## 2006-08-24 ENCOUNTER — Ambulatory Visit: Payer: Self-pay | Admitting: Internal Medicine

## 2006-08-31 IMAGING — CR DG SMALL BOWEL
5 series · 5 of 5 positions shown · non-contrast
Comparison: none

CLINICAL DATA: Abdominal pain.  Questionable filling defects seen on CT scan of 09/16/05.
SMALL BOWEL SERIES:
Scout film prior to the ingestion of contrast material by the patient shows metallic clips in the right upper quadrant of the abdomen consistent with previous cholecystectomy.  There is no evidence of obstruction or perforation of the bowel.  There is no evidence of mass or abnormal calcification is seen.  
The patient ingested contrast material which was seen to pass freely through the stomach and entire small bowel.  The small bowel showed normal position and diameter with no evidence of obstruction.  There were areas of lucency within the bowel most of which appear to be areas of air.  There are at least four to five areas which are suspicious for small nonobstructive small bowel polyps which do  not appear to be associated with obstruction, adhesions, or intussusception.  The terminal ileum appears normal.

[view not recorded (1 of 5)]
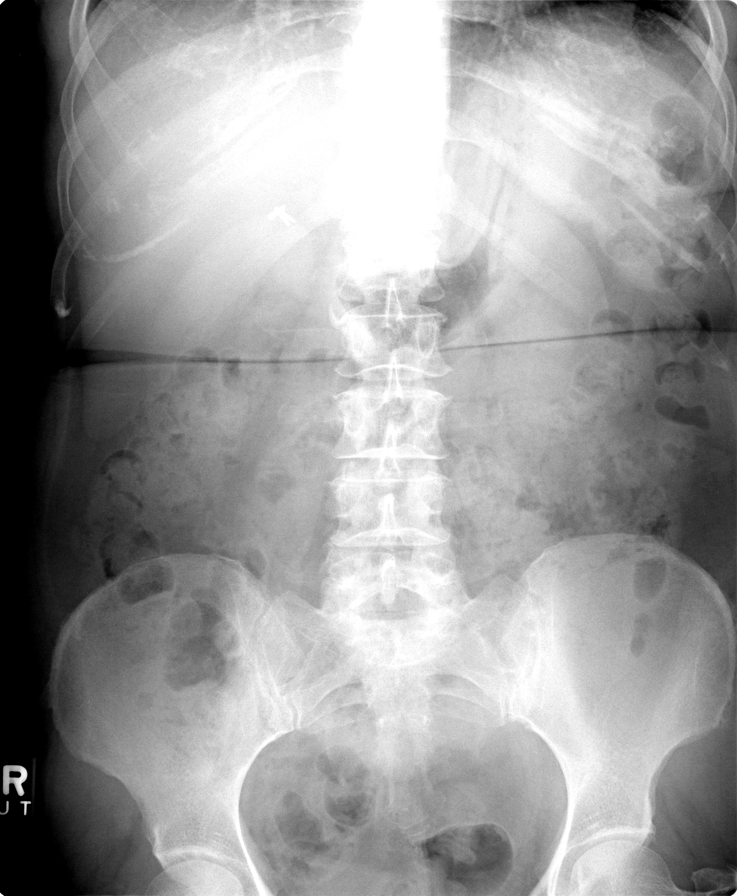

[view not recorded (2 of 5)]
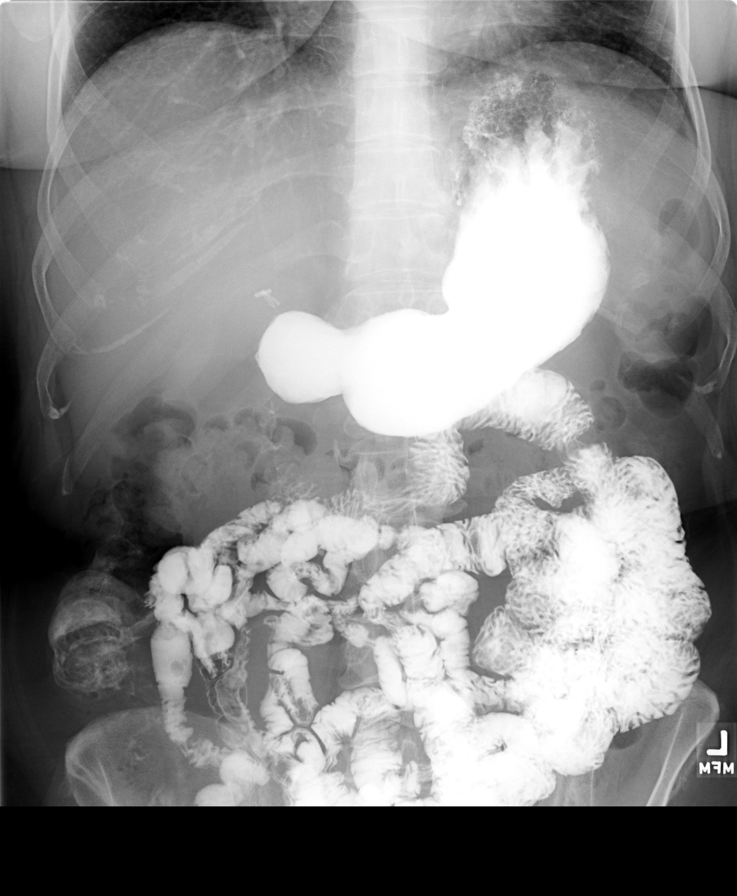

[view not recorded (3 of 5)]
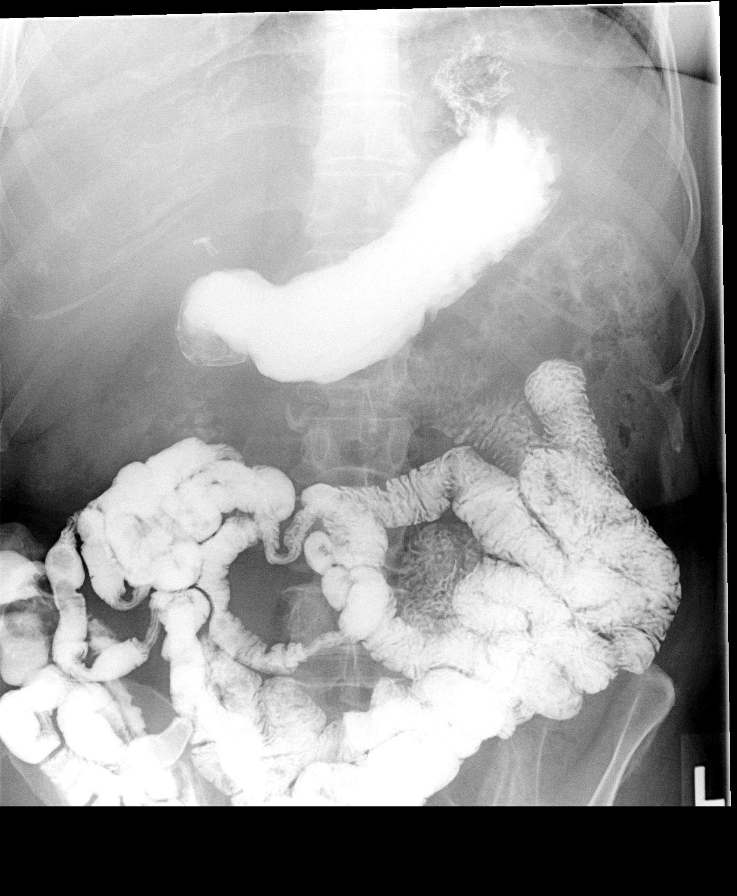

[view not recorded (4 of 5)]
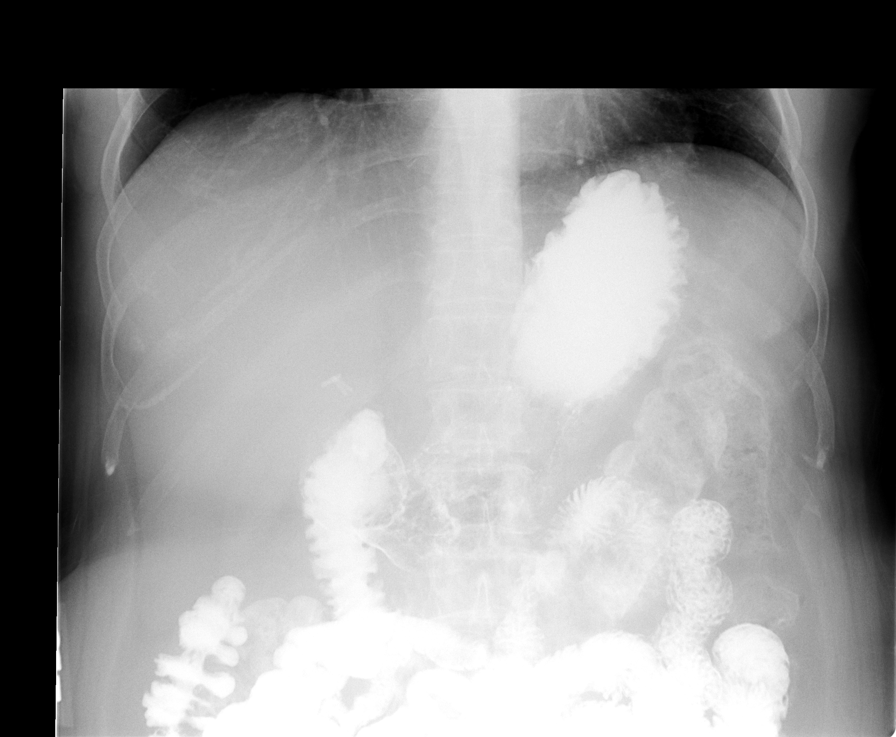

[view not recorded (5 of 5)]
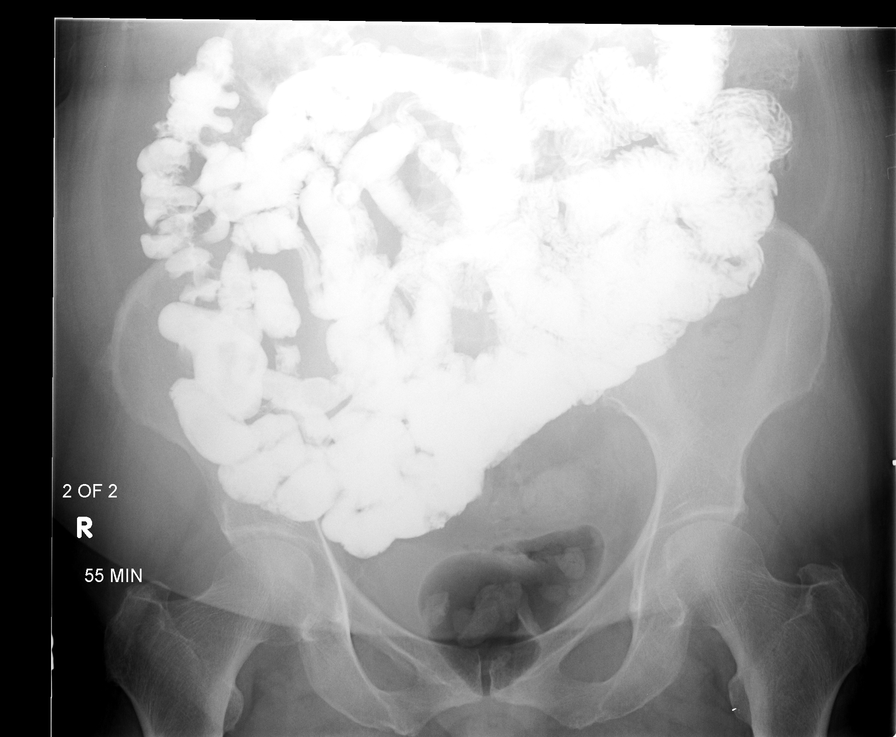

[5 of 5 positions shown; findings below may reference images not displayed]

IMPRESSION: There are again noted small areas of filling defect,   all  less than  one centimeter in diameter within the  distal one-half of the small bowel most of which can be compatible with air within the bowel but several at least four to five, could be small, nonobstructive polyps.  Would recommend consideration of evaluation utilizing the endoscopic   capsule camera  or follow-up small bowel study  in  four  months after one  week  of  low-residue diet.

## 2006-11-15 ENCOUNTER — Ambulatory Visit: Payer: Self-pay | Admitting: Internal Medicine

## 2006-11-15 LAB — CONVERTED CEMR LAB
ALT: 16 units/L (ref 0–40)
AST: 21 units/L (ref 0–37)
Albumin: 3.8 g/dL (ref 3.5–5.2)
Alkaline Phosphatase: 92 units/L (ref 39–117)
BUN: 10 mg/dL (ref 6–23)
Bilirubin, Direct: 0.1 mg/dL (ref 0.0–0.3)
CO2: 27 meq/L (ref 19–32)
Calcium: 9.2 mg/dL (ref 8.4–10.5)
Chloride: 105 meq/L (ref 96–112)
Cholesterol: 210 mg/dL (ref 0–200)
Creatinine, Ser: 0.8 mg/dL (ref 0.4–1.2)
Direct LDL: 136.6 mg/dL
GFR calc Af Amer: 95 mL/min
GFR calc non Af Amer: 78 mL/min
Glucose, Bld: 178 mg/dL — ABNORMAL HIGH (ref 70–99)
HDL: 26.2 mg/dL — ABNORMAL LOW (ref >39.0)
Hgb A1c MFr Bld: 7.8 % — ABNORMAL HIGH (ref 4.6–6.0)
Potassium: 3.8 meq/L (ref 3.5–5.1)
Sodium: 139 meq/L (ref 135–145)
Total Bilirubin: 0.6 mg/dL (ref 0.3–1.2)
Total CHOL/HDL Ratio: 8
Total Protein: 6.5 g/dL (ref 6.0–8.3)
Triglycerides: 383 mg/dL (ref 0–149)
VLDL: 77 mg/dL — ABNORMAL HIGH (ref 0–40)

## 2007-02-19 ENCOUNTER — Ambulatory Visit: Payer: Self-pay | Admitting: Internal Medicine

## 2007-02-20 LAB — CONVERTED CEMR LAB
ALT: 19 units/L (ref 0–40)
AST: 22 units/L (ref 0–37)
Albumin: 3.9 g/dL (ref 3.5–5.2)
Alkaline Phosphatase: 81 units/L (ref 39–117)
BUN: 9 mg/dL (ref 6–23)
Basophils Absolute: 0.1 10*3/uL (ref 0.0–0.1)
Basophils Relative: 1.3 % — ABNORMAL HIGH (ref 0.0–1.0)
Bilirubin, Direct: 0.1 mg/dL (ref 0.0–0.3)
CO2: 29 meq/L (ref 19–32)
Calcium: 9.3 mg/dL (ref 8.4–10.5)
Chloride: 110 meq/L (ref 96–112)
Cholesterol: 176 mg/dL (ref 0–200)
Creatinine, Ser: 0.7 mg/dL (ref 0.4–1.2)
Direct LDL: 104.8 mg/dL
Eosinophils Absolute: 0.2 10*3/uL (ref 0.0–0.6)
Eosinophils Relative: 2.8 % (ref 0.0–5.0)
GFR calc Af Amer: 111 mL/min
GFR calc non Af Amer: 91 mL/min
Glucose, Bld: 96 mg/dL (ref 70–99)
HCT: 41.9 % (ref 36.0–46.0)
HDL: 28 mg/dL — ABNORMAL LOW (ref >39.0)
Hemoglobin: 15 g/dL (ref 12.0–15.0)
Hgb A1c MFr Bld: 6.5 % — ABNORMAL HIGH (ref 4.6–6.0)
Lymphocytes Relative: 35.7 % (ref 12.0–46.0)
MCHC: 35.7 g/dL (ref 30.0–36.0)
MCV: 90.8 fL (ref 78.0–100.0)
Monocytes Absolute: 0.8 10*3/uL — ABNORMAL HIGH (ref 0.2–0.7)
Monocytes Relative: 9.1 % (ref 3.0–11.0)
Neutro Abs: 4.6 10*3/uL (ref 1.4–7.7)
Neutrophils Relative %: 51.1 % (ref 43.0–77.0)
Platelets: 243 10*3/uL (ref 150–400)
Potassium: 4.5 meq/L (ref 3.5–5.1)
RBC: 4.61 M/uL (ref 3.87–5.11)
RDW: 13.2 % (ref 11.5–14.6)
Sodium: 146 meq/L — ABNORMAL HIGH (ref 135–145)
Total Bilirubin: 0.5 mg/dL (ref 0.3–1.2)
Total CHOL/HDL Ratio: 6.3
Total Protein: 6.6 g/dL (ref 6.0–8.3)
Triglycerides: 421 mg/dL (ref 0–149)
VLDL: 84 mg/dL — ABNORMAL HIGH (ref 0–40)
WBC: 8.8 10*3/uL (ref 4.5–10.5)

## 2007-04-24 DIAGNOSIS — E118 Type 2 diabetes mellitus with unspecified complications: Secondary | ICD-10-CM

## 2007-04-24 DIAGNOSIS — E1165 Type 2 diabetes mellitus with hyperglycemia: Secondary | ICD-10-CM

## 2007-04-24 DIAGNOSIS — IMO0002 Reserved for concepts with insufficient information to code with codable children: Secondary | ICD-10-CM | POA: Insufficient documentation

## 2007-04-24 DIAGNOSIS — K219 Gastro-esophageal reflux disease without esophagitis: Secondary | ICD-10-CM | POA: Insufficient documentation

## 2007-05-22 ENCOUNTER — Ambulatory Visit: Payer: Self-pay | Admitting: Internal Medicine

## 2007-05-22 DIAGNOSIS — E782 Mixed hyperlipidemia: Secondary | ICD-10-CM | POA: Insufficient documentation

## 2007-05-22 LAB — CONVERTED CEMR LAB
ALT: 19 units/L (ref 0–35)
AST: 20 units/L (ref 0–37)
Albumin: 3.8 g/dL (ref 3.5–5.2)
Alkaline Phosphatase: 99 units/L (ref 39–117)
BUN: 8 mg/dL (ref 6–23)
Bilirubin, Direct: 0.1 mg/dL (ref 0.0–0.3)
CO2: 28 meq/L (ref 19–32)
Calcium: 9.5 mg/dL (ref 8.4–10.5)
Chloride: 107 meq/L (ref 96–112)
Cholesterol, target level: 200 mg/dL
Cholesterol: 162 mg/dL (ref 0–200)
Creatinine, Ser: 0.7 mg/dL (ref 0.4–1.2)
GFR calc Af Amer: 111 mL/min
GFR calc non Af Amer: 91 mL/min
Glucose, Bld: 170 mg/dL — ABNORMAL HIGH (ref 70–99)
HDL goal, serum: 40 mg/dL
HDL: 24.6 mg/dL — ABNORMAL LOW (ref >39.0)
Hgb A1c MFr Bld: 6.6 % — ABNORMAL HIGH (ref 4.6–6.0)
LDL Cholesterol: 100 mg/dL — ABNORMAL HIGH (ref 0–99)
LDL Goal: 70 mg/dL
Potassium: 4 meq/L (ref 3.5–5.1)
Sodium: 141 meq/L (ref 135–145)
Total Bilirubin: 0.9 mg/dL (ref 0.3–1.2)
Total CHOL/HDL Ratio: 6.6
Total Protein: 6.8 g/dL (ref 6.0–8.3)
Triglycerides: 186 mg/dL — ABNORMAL HIGH (ref 0–149)
VLDL: 37 mg/dL (ref 0–40)

## 2007-05-30 ENCOUNTER — Telehealth: Payer: Self-pay | Admitting: *Deleted

## 2007-07-30 ENCOUNTER — Ambulatory Visit: Payer: Self-pay | Admitting: Internal Medicine

## 2007-07-30 ENCOUNTER — Observation Stay (HOSPITAL_COMMUNITY): Admission: EM | Admit: 2007-07-30 | Discharge: 2007-07-31 | Payer: Self-pay | Admitting: Emergency Medicine

## 2007-07-31 ENCOUNTER — Ambulatory Visit: Payer: Self-pay | Admitting: Internal Medicine

## 2007-08-03 ENCOUNTER — Encounter: Payer: Self-pay | Admitting: Cardiology

## 2007-08-03 ENCOUNTER — Ambulatory Visit: Payer: Self-pay

## 2007-08-03 ENCOUNTER — Encounter: Payer: Self-pay | Admitting: Internal Medicine

## 2007-08-06 ENCOUNTER — Ambulatory Visit: Payer: Self-pay | Admitting: Internal Medicine

## 2007-09-07 ENCOUNTER — Ambulatory Visit: Payer: Self-pay | Admitting: Cardiology

## 2007-09-17 ENCOUNTER — Ambulatory Visit: Payer: Self-pay | Admitting: Internal Medicine

## 2007-09-17 LAB — CONVERTED CEMR LAB
ALT: 17 units/L (ref 0–35)
AST: 19 units/L (ref 0–37)
Albumin: 3.8 g/dL (ref 3.5–5.2)
Alkaline Phosphatase: 102 units/L (ref 39–117)
BUN: 13 mg/dL (ref 6–23)
Bilirubin, Direct: 0.1 mg/dL (ref 0.0–0.3)
CO2: 27 meq/L (ref 19–32)
Calcium: 9.4 mg/dL (ref 8.4–10.5)
Chloride: 106 meq/L (ref 96–112)
Cholesterol: 170 mg/dL (ref 0–200)
Creatinine, Ser: 0.8 mg/dL (ref 0.4–1.2)
Creatinine,U: 197.1 mg/dL
GFR calc Af Amer: 94 mL/min
GFR calc non Af Amer: 78 mL/min
Glucose, Bld: 127 mg/dL — ABNORMAL HIGH (ref 70–99)
HDL: 22.8 mg/dL — ABNORMAL LOW (ref >39.0)
Hgb A1c MFr Bld: 6.6 % — ABNORMAL HIGH (ref 4.6–6.0)
LDL Cholesterol: 119 mg/dL — ABNORMAL HIGH (ref 0–99)
Microalb Creat Ratio: 3.6 mg/g (ref 0.0–30.0)
Microalb, Ur: 0.7 mg/dL (ref 0.0–1.9)
Potassium: 4.2 meq/L (ref 3.5–5.1)
Sodium: 142 meq/L (ref 135–145)
Total Bilirubin: 0.7 mg/dL (ref 0.3–1.2)
Total CHOL/HDL Ratio: 7.5
Total Protein: 6.4 g/dL (ref 6.0–8.3)
Triglycerides: 140 mg/dL (ref 0–149)
VLDL: 28 mg/dL (ref 0–40)

## 2007-09-24 ENCOUNTER — Ambulatory Visit: Payer: Self-pay | Admitting: Internal Medicine

## 2007-12-18 ENCOUNTER — Ambulatory Visit: Payer: Self-pay | Admitting: Internal Medicine

## 2007-12-18 LAB — CONVERTED CEMR LAB
ALT: 20 units/L (ref 0–35)
AST: 18 units/L (ref 0–37)
Alkaline Phosphatase: 94 units/L (ref 39–117)
BUN: 7 mg/dL (ref 6–23)
CO2: 30 meq/L (ref 19–32)
Calcium: 9.6 mg/dL (ref 8.4–10.5)
Chloride: 106 meq/L (ref 96–112)
GFR calc Af Amer: 110 mL/min
Total CHOL/HDL Ratio: 6
Total Protein: 6.5 g/dL (ref 6.0–8.3)

## 2007-12-31 ENCOUNTER — Ambulatory Visit: Payer: Self-pay | Admitting: Internal Medicine

## 2008-02-11 ENCOUNTER — Ambulatory Visit: Payer: Self-pay | Admitting: Internal Medicine

## 2008-04-03 ENCOUNTER — Ambulatory Visit: Payer: Self-pay | Admitting: Internal Medicine

## 2008-04-03 LAB — CONVERTED CEMR LAB
Bilirubin, Direct: 0.1 mg/dL (ref 0.0–0.3)
Calcium: 9.1 mg/dL (ref 8.4–10.5)
Direct LDL: 122.2 mg/dL
GFR calc Af Amer: 110 mL/min
HDL: 22.4 mg/dL — ABNORMAL LOW (ref >39.0)
Sodium: 140 meq/L (ref 135–145)
Total Bilirubin: 0.6 mg/dL (ref 0.3–1.2)
Total CHOL/HDL Ratio: 8.8
Triglycerides: 255 mg/dL (ref 0–149)

## 2008-04-11 ENCOUNTER — Ambulatory Visit: Payer: Self-pay | Admitting: Internal Medicine

## 2008-06-01 IMAGING — CR DG CHEST 1V PORT
1 series · 1 of 1 positions shown · non-contrast
Comparison: 03/27/04.

CLINICAL DATA: Chest pain. 
 PORTABLE CHEST - 1 VIEW - 07/30/07:

[view not recorded]
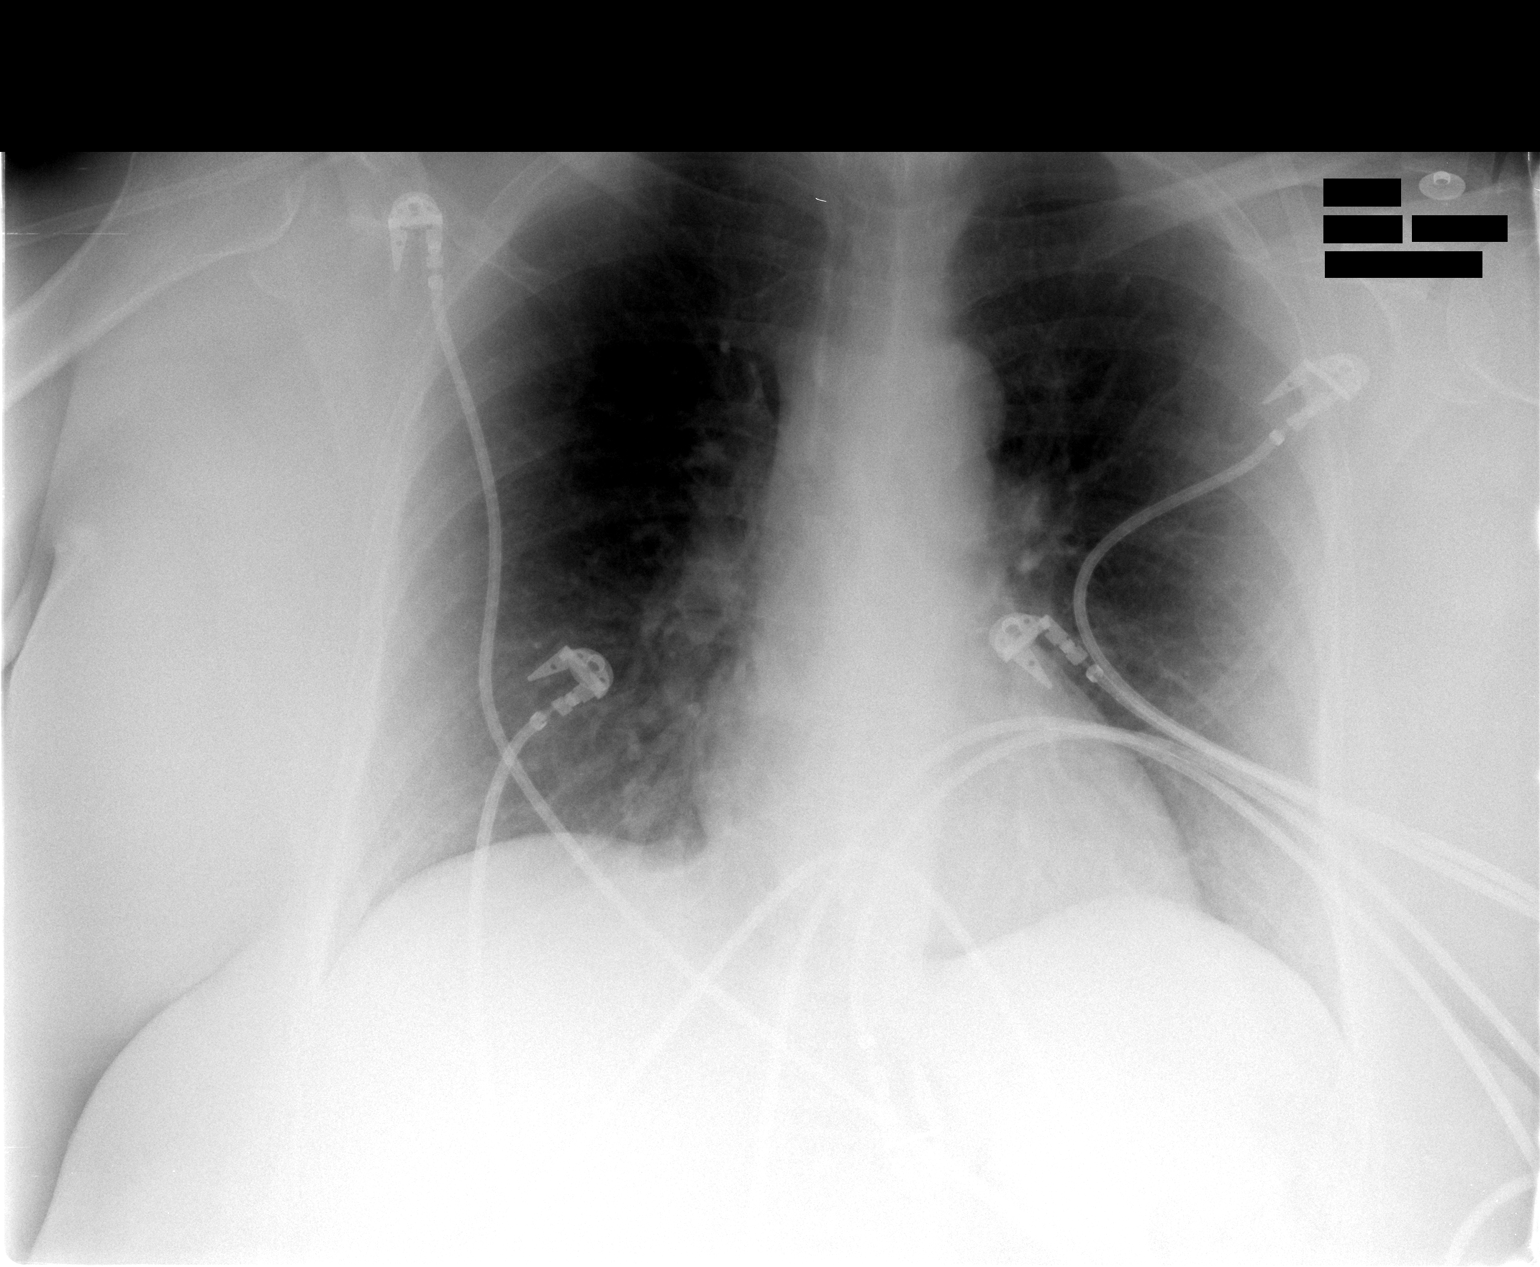

[1 of 1 positions shown; findings below may reference images not displayed]

FINDINGS: The heart size and mediastinal contours are within normal limits.  Both lungs are clear.
IMPRESSION: No acute findings.

## 2008-07-07 ENCOUNTER — Ambulatory Visit: Payer: Self-pay | Admitting: Internal Medicine

## 2008-07-10 ENCOUNTER — Ambulatory Visit: Payer: Self-pay | Admitting: Internal Medicine

## 2008-07-10 LAB — CONVERTED CEMR LAB
Albumin: 3.8 g/dL (ref 3.5–5.2)
Alkaline Phosphatase: 102 units/L (ref 39–117)
BUN: 13 mg/dL (ref 6–23)
Calcium: 9.1 mg/dL (ref 8.4–10.5)
Cholesterol: 191 mg/dL (ref 0–200)
GFR calc Af Amer: 131 mL/min
Glucose, Bld: 96 mg/dL (ref 70–99)
HDL: 20.3 mg/dL — ABNORMAL LOW (ref >39.0)
Total Protein: 6.8 g/dL (ref 6.0–8.3)
Triglycerides: 197 mg/dL — ABNORMAL HIGH (ref 0–149)
VLDL: 39 mg/dL (ref 0–40)

## 2008-07-18 ENCOUNTER — Ambulatory Visit: Payer: Self-pay | Admitting: Internal Medicine

## 2008-07-22 ENCOUNTER — Encounter: Payer: Self-pay | Admitting: Internal Medicine

## 2008-12-27 ENCOUNTER — Emergency Department (HOSPITAL_COMMUNITY): Admission: EM | Admit: 2008-12-27 | Discharge: 2008-12-27 | Payer: Self-pay | Admitting: Emergency Medicine

## 2009-01-06 ENCOUNTER — Ambulatory Visit: Payer: Self-pay | Admitting: Critical Care Medicine

## 2009-01-07 ENCOUNTER — Encounter: Payer: Self-pay | Admitting: Critical Care Medicine

## 2009-01-07 ENCOUNTER — Ambulatory Visit: Payer: Self-pay | Admitting: Internal Medicine

## 2009-01-08 ENCOUNTER — Ambulatory Visit: Payer: Self-pay | Admitting: Emergency Medicine

## 2009-01-08 ENCOUNTER — Ambulatory Visit (HOSPITAL_COMMUNITY): Admission: RE | Admit: 2009-01-08 | Discharge: 2009-01-08 | Payer: Self-pay | Admitting: Thoracic Surgery

## 2009-01-08 ENCOUNTER — Encounter: Payer: Self-pay | Admitting: Thoracic Surgery

## 2009-01-08 ENCOUNTER — Encounter: Payer: Self-pay | Admitting: Emergency Medicine

## 2009-01-14 ENCOUNTER — Ambulatory Visit (HOSPITAL_COMMUNITY): Admission: RE | Admit: 2009-01-14 | Discharge: 2009-01-14 | Payer: Self-pay | Admitting: Critical Care Medicine

## 2009-01-14 ENCOUNTER — Encounter: Payer: Self-pay | Admitting: Critical Care Medicine

## 2009-01-15 ENCOUNTER — Ambulatory Visit: Payer: Self-pay | Admitting: Pulmonary Disease

## 2009-01-15 ENCOUNTER — Ambulatory Visit: Admission: RE | Admit: 2009-01-15 | Discharge: 2009-03-24 | Payer: Self-pay | Admitting: Radiation Oncology

## 2009-01-15 ENCOUNTER — Encounter: Payer: Self-pay | Admitting: Critical Care Medicine

## 2009-01-15 ENCOUNTER — Ambulatory Visit: Payer: Self-pay | Admitting: Internal Medicine

## 2009-01-16 ENCOUNTER — Ambulatory Visit (HOSPITAL_COMMUNITY): Admission: RE | Admit: 2009-01-16 | Discharge: 2009-01-16 | Payer: Self-pay | Admitting: Internal Medicine

## 2009-01-18 DIAGNOSIS — C3490 Malignant neoplasm of unspecified part of unspecified bronchus or lung: Secondary | ICD-10-CM | POA: Insufficient documentation

## 2009-01-20 LAB — COMPREHENSIVE METABOLIC PANEL
ALT: 35 U/L (ref 0–35)
Albumin: 3.6 g/dL (ref 3.5–5.2)
CO2: 24 mEq/L (ref 19–32)
Calcium: 8.9 mg/dL (ref 8.4–10.5)
Chloride: 104 mEq/L (ref 96–112)
Glucose, Bld: 115 mg/dL — ABNORMAL HIGH (ref 70–99)
Potassium: 4.7 mEq/L (ref 3.5–5.3)
Sodium: 138 mEq/L (ref 135–145)
Total Protein: 6.6 g/dL (ref 6.0–8.3)

## 2009-01-20 LAB — CBC WITH DIFFERENTIAL/PLATELET
BASO%: 0.1 % (ref 0.0–2.0)
Eosinophils Absolute: 0.2 10*3/uL (ref 0.0–0.5)
LYMPH%: 18.2 % (ref 14.0–49.7)
MCHC: 33.3 g/dL (ref 31.5–36.0)
MONO#: 0.8 10*3/uL (ref 0.1–0.9)
NEUT#: 6.6 10*3/uL — ABNORMAL HIGH (ref 1.5–6.5)
Platelets: 226 10*3/uL (ref 145–400)
RBC: 4.58 10*6/uL (ref 3.70–5.45)
RDW: 13.4 % (ref 11.2–14.5)
WBC: 9.2 10*3/uL (ref 3.9–10.3)
lymph#: 1.7 10*3/uL (ref 0.9–3.3)
nRBC: 0 % (ref 0–0)

## 2009-01-27 LAB — CBC WITH DIFFERENTIAL/PLATELET
Basophils Absolute: 0 10*3/uL (ref 0.0–0.1)
EOS%: 3 % (ref 0.0–7.0)
Eosinophils Absolute: 0.1 10*3/uL (ref 0.0–0.5)
HCT: 41 % (ref 34.8–46.6)
HGB: 14.1 g/dL (ref 11.6–15.9)
MCH: 30.3 pg (ref 25.1–34.0)
MCV: 87.9 fL (ref 79.5–101.0)
MONO%: 4.6 % (ref 0.0–14.0)
NEUT%: 37.8 % — ABNORMAL LOW (ref 38.4–76.8)

## 2009-01-27 LAB — COMPREHENSIVE METABOLIC PANEL
AST: 10 U/L (ref 0–37)
Alkaline Phosphatase: 130 U/L — ABNORMAL HIGH (ref 39–117)
BUN: 13 mg/dL (ref 6–23)
Calcium: 9 mg/dL (ref 8.4–10.5)
Chloride: 103 mEq/L (ref 96–112)
Creatinine, Ser: 0.61 mg/dL (ref 0.40–1.20)
Glucose, Bld: 127 mg/dL — ABNORMAL HIGH (ref 70–99)

## 2009-02-03 LAB — COMPREHENSIVE METABOLIC PANEL
Albumin: 4 g/dL (ref 3.5–5.2)
Alkaline Phosphatase: 125 U/L — ABNORMAL HIGH (ref 39–117)
BUN: 8 mg/dL (ref 6–23)
CO2: 24 mEq/L (ref 19–32)
Calcium: 9 mg/dL (ref 8.4–10.5)
Chloride: 105 mEq/L (ref 96–112)
Glucose, Bld: 202 mg/dL — ABNORMAL HIGH (ref 70–99)
Potassium: 4.2 mEq/L (ref 3.5–5.3)
Sodium: 140 mEq/L (ref 135–145)
Total Protein: 6.5 g/dL (ref 6.0–8.3)

## 2009-02-03 LAB — CBC WITH DIFFERENTIAL/PLATELET
Basophils Absolute: 0.1 10*3/uL (ref 0.0–0.1)
EOS%: 0.9 % (ref 0.0–7.0)
Eosinophils Absolute: 0.1 10*3/uL (ref 0.0–0.5)
HCT: 37.1 % (ref 34.8–46.6)
HGB: 12.6 g/dL (ref 11.6–15.9)
MCH: 29.4 pg (ref 25.1–34.0)
NEUT#: 6.3 10*3/uL (ref 1.5–6.5)
NEUT%: 67.8 % (ref 38.4–76.8)
RDW: 13.4 % (ref 11.2–14.5)
lymph#: 1.6 10*3/uL (ref 0.9–3.3)

## 2009-02-10 ENCOUNTER — Encounter: Payer: Self-pay | Admitting: Critical Care Medicine

## 2009-02-10 LAB — CBC WITH DIFFERENTIAL/PLATELET
Basophils Absolute: 0 10*3/uL (ref 0.0–0.1)
Eosinophils Absolute: 0 10*3/uL (ref 0.0–0.5)
HCT: 35.2 % (ref 34.8–46.6)
HGB: 11.9 g/dL (ref 11.6–15.9)
LYMPH%: 11.5 % — ABNORMAL LOW (ref 14.0–49.7)
MONO#: 1.2 10*3/uL — ABNORMAL HIGH (ref 0.1–0.9)
NEUT#: 6.3 10*3/uL (ref 1.5–6.5)
NEUT%: 73.9 % (ref 38.4–76.8)
Platelets: 210 10*3/uL (ref 145–400)
WBC: 8.6 10*3/uL (ref 3.9–10.3)

## 2009-02-10 LAB — COMPREHENSIVE METABOLIC PANEL
CO2: 23 mEq/L (ref 19–32)
Calcium: 8.9 mg/dL (ref 8.4–10.5)
Chloride: 105 mEq/L (ref 96–112)
Creatinine, Ser: 0.64 mg/dL (ref 0.40–1.20)
Glucose, Bld: 117 mg/dL — ABNORMAL HIGH (ref 70–99)
Total Bilirubin: 0.4 mg/dL (ref 0.3–1.2)

## 2009-02-13 ENCOUNTER — Telehealth: Payer: Self-pay | Admitting: Internal Medicine

## 2009-02-17 LAB — COMPREHENSIVE METABOLIC PANEL
ALT: 10 U/L (ref 0–35)
Albumin: 4.2 g/dL (ref 3.5–5.2)
CO2: 25 mEq/L (ref 19–32)
Calcium: 9.2 mg/dL (ref 8.4–10.5)
Chloride: 104 mEq/L (ref 96–112)
Glucose, Bld: 120 mg/dL — ABNORMAL HIGH (ref 70–99)
Sodium: 140 mEq/L (ref 135–145)
Total Bilirubin: 0.8 mg/dL (ref 0.3–1.2)
Total Protein: 6.9 g/dL (ref 6.0–8.3)

## 2009-02-17 LAB — CBC WITH DIFFERENTIAL/PLATELET
BASO%: 0.6 % (ref 0.0–2.0)
Eosinophils Absolute: 0 10*3/uL (ref 0.0–0.5)
HCT: 37.2 % (ref 34.8–46.6)
LYMPH%: 8.2 % — ABNORMAL LOW (ref 14.0–49.7)
MONO#: 0.4 10*3/uL (ref 0.1–0.9)
NEUT#: 5.6 10*3/uL (ref 1.5–6.5)
Platelets: 257 10*3/uL (ref 145–400)
RBC: 4.24 10*6/uL (ref 3.70–5.45)
WBC: 6.7 10*3/uL (ref 3.9–10.3)
lymph#: 0.6 10*3/uL — ABNORMAL LOW (ref 0.9–3.3)

## 2009-02-23 ENCOUNTER — Ambulatory Visit: Payer: Self-pay | Admitting: Internal Medicine

## 2009-02-25 ENCOUNTER — Ambulatory Visit (HOSPITAL_COMMUNITY): Admission: RE | Admit: 2009-02-25 | Discharge: 2009-02-25 | Payer: Self-pay | Admitting: Internal Medicine

## 2009-02-25 LAB — CBC WITH DIFFERENTIAL/PLATELET
BASO%: 0 % (ref 0.0–2.0)
EOS%: 0.9 % (ref 0.0–7.0)
HCT: 28.8 % — ABNORMAL LOW (ref 34.8–46.6)
LYMPH%: 6.3 % — ABNORMAL LOW (ref 14.0–49.7)
MCH: 30.2 pg (ref 25.1–34.0)
MCHC: 34.9 g/dL (ref 31.5–36.0)
NEUT%: 81.8 % — ABNORMAL HIGH (ref 38.4–76.8)
Platelets: 48 10*3/uL — ABNORMAL LOW (ref 145–400)
lymph#: 0.5 10*3/uL — ABNORMAL LOW (ref 0.9–3.3)

## 2009-02-25 LAB — COMPREHENSIVE METABOLIC PANEL
ALT: 44 U/L — ABNORMAL HIGH (ref 0–35)
AST: 25 U/L (ref 0–37)
Alkaline Phosphatase: 99 U/L (ref 39–117)
Creatinine, Ser: 0.57 mg/dL (ref 0.40–1.20)
Total Bilirubin: 0.4 mg/dL (ref 0.3–1.2)

## 2009-03-03 ENCOUNTER — Encounter: Payer: Self-pay | Admitting: Critical Care Medicine

## 2009-03-03 LAB — COMPREHENSIVE METABOLIC PANEL
ALT: 33 U/L (ref 0–35)
Alkaline Phosphatase: 91 U/L (ref 39–117)
Creatinine, Ser: 0.59 mg/dL (ref 0.40–1.20)
Glucose, Bld: 113 mg/dL — ABNORMAL HIGH (ref 70–99)
Sodium: 140 mEq/L (ref 135–145)
Total Bilirubin: 0.4 mg/dL (ref 0.3–1.2)
Total Protein: 6.7 g/dL (ref 6.0–8.3)

## 2009-03-03 LAB — CBC WITH DIFFERENTIAL/PLATELET
EOS%: 1.2 % (ref 0.0–7.0)
HCT: 30.2 % — ABNORMAL LOW (ref 34.8–46.6)
HGB: 10 g/dL — ABNORMAL LOW (ref 11.6–15.9)
LYMPH%: 7.1 % — ABNORMAL LOW (ref 14.0–49.7)
MONO%: 16.4 % — ABNORMAL HIGH (ref 0.0–14.0)
NEUT#: 5.2 10*3/uL (ref 1.5–6.5)
WBC: 6.9 10*3/uL (ref 3.9–10.3)
nRBC: 0 % (ref 0–0)

## 2009-03-10 LAB — COMPREHENSIVE METABOLIC PANEL
Alkaline Phosphatase: 101 U/L (ref 39–117)
BUN: 22 mg/dL (ref 6–23)
CO2: 23 mEq/L (ref 19–32)
Creatinine, Ser: 0.68 mg/dL (ref 0.40–1.20)
Glucose, Bld: 199 mg/dL — ABNORMAL HIGH (ref 70–99)
Sodium: 140 mEq/L (ref 135–145)
Total Bilirubin: 0.6 mg/dL (ref 0.3–1.2)

## 2009-03-10 LAB — CBC WITH DIFFERENTIAL/PLATELET
Eosinophils Absolute: 0 10*3/uL (ref 0.0–0.5)
HCT: 27.8 % — ABNORMAL LOW (ref 34.8–46.6)
LYMPH%: 9.6 % — ABNORMAL LOW (ref 14.0–49.7)
MCV: 87.3 fL (ref 79.5–101.0)
MONO#: 0.1 10*3/uL (ref 0.1–0.9)
MONO%: 5.1 % (ref 0.0–14.0)
NEUT#: 1.9 10*3/uL (ref 1.5–6.5)
NEUT%: 84.6 % — ABNORMAL HIGH (ref 38.4–76.8)
Platelets: 175 10*3/uL (ref 145–400)
RBC: 3.19 10*6/uL — ABNORMAL LOW (ref 3.70–5.45)
WBC: 2.3 10*3/uL — ABNORMAL LOW (ref 3.9–10.3)

## 2009-03-18 ENCOUNTER — Encounter: Payer: Self-pay | Admitting: Critical Care Medicine

## 2009-03-19 ENCOUNTER — Encounter (HOSPITAL_COMMUNITY): Admission: RE | Admit: 2009-03-19 | Discharge: 2009-04-22 | Payer: Self-pay | Admitting: Internal Medicine

## 2009-03-19 ENCOUNTER — Encounter: Payer: Self-pay | Admitting: Critical Care Medicine

## 2009-03-19 LAB — CBC WITH DIFFERENTIAL/PLATELET
Basophils Absolute: 0 10*3/uL (ref 0.0–0.1)
Eosinophils Absolute: 0 10*3/uL (ref 0.0–0.5)
HCT: 20.9 % — ABNORMAL LOW (ref 34.8–46.6)
HGB: 7.3 g/dL — ABNORMAL LOW (ref 11.6–15.9)
LYMPH%: 7.7 % — ABNORMAL LOW (ref 14.0–49.7)
MCV: 87.4 fL (ref 79.5–101.0)
MONO#: 0.8 10*3/uL (ref 0.1–0.9)
MONO%: 10.4 % (ref 0.0–14.0)
NEUT#: 6.3 10*3/uL (ref 1.5–6.5)
NEUT%: 81.5 % — ABNORMAL HIGH (ref 38.4–76.8)
Platelets: 44 10*3/uL — ABNORMAL LOW (ref 145–400)
RBC: 2.38 10*6/uL — ABNORMAL LOW (ref 3.70–5.45)
WBC: 7.8 10*3/uL (ref 3.9–10.3)

## 2009-03-19 LAB — COMPREHENSIVE METABOLIC PANEL
ALT: 28 U/L (ref 0–35)
AST: 20 U/L (ref 0–37)
Alkaline Phosphatase: 96 U/L (ref 39–117)
Chloride: 108 mEq/L (ref 96–112)
Creatinine, Ser: 0.61 mg/dL (ref 0.40–1.20)
Total Bilirubin: 0.4 mg/dL (ref 0.3–1.2)

## 2009-03-30 LAB — CBC WITH DIFFERENTIAL/PLATELET
BASO%: 0.2 % (ref 0.0–2.0)
Basophils Absolute: 0 10*3/uL (ref 0.0–0.1)
EOS%: 0.3 % (ref 0.0–7.0)
HCT: 32.8 % — ABNORMAL LOW (ref 34.8–46.6)
HGB: 10.8 g/dL — ABNORMAL LOW (ref 11.6–15.9)
MCH: 28.6 pg (ref 25.1–34.0)
MCHC: 32.9 g/dL (ref 31.5–36.0)
MCV: 87 fL (ref 79.5–101.0)
MONO%: 22.4 % — ABNORMAL HIGH (ref 0.0–14.0)
NEUT%: 65.5 % (ref 38.4–76.8)
RDW: 19.3 % — ABNORMAL HIGH (ref 11.2–14.5)

## 2009-03-30 LAB — COMPREHENSIVE METABOLIC PANEL
ALT: 14 U/L (ref 0–35)
AST: 17 U/L (ref 0–37)
Alkaline Phosphatase: 87 U/L (ref 39–117)
BUN: 8 mg/dL (ref 6–23)
Creatinine, Ser: 0.58 mg/dL (ref 0.40–1.20)

## 2009-04-06 LAB — CBC WITH DIFFERENTIAL/PLATELET
BASO%: 0.7 % (ref 0.0–2.0)
EOS%: 1.5 % (ref 0.0–7.0)
HGB: 10.3 g/dL — ABNORMAL LOW (ref 11.6–15.9)
MCH: 30.9 pg (ref 25.1–34.0)
MCHC: 35.8 g/dL (ref 31.5–36.0)
MONO#: 0.2 10*3/uL (ref 0.1–0.9)
RDW: 19.7 % — ABNORMAL HIGH (ref 11.2–14.5)
WBC: 2.9 10*3/uL — ABNORMAL LOW (ref 3.9–10.3)
lymph#: 0.6 10*3/uL — ABNORMAL LOW (ref 0.9–3.3)

## 2009-04-06 LAB — COMPREHENSIVE METABOLIC PANEL
ALT: 11 U/L (ref 0–35)
AST: 11 U/L (ref 0–37)
Albumin: 3.7 g/dL (ref 3.5–5.2)
CO2: 24 mEq/L (ref 19–32)
Calcium: 8.4 mg/dL (ref 8.4–10.5)
Chloride: 105 mEq/L (ref 96–112)
Potassium: 3.6 mEq/L (ref 3.5–5.3)
Total Protein: 6.2 g/dL (ref 6.0–8.3)

## 2009-04-15 ENCOUNTER — Ambulatory Visit: Payer: Self-pay | Admitting: Internal Medicine

## 2009-04-16 ENCOUNTER — Ambulatory Visit: Payer: Self-pay | Admitting: Internal Medicine

## 2009-04-16 LAB — CONVERTED CEMR LAB
ALT: 28 units/L (ref 0–35)
Albumin: 3.4 g/dL — ABNORMAL LOW (ref 3.5–5.2)
Alkaline Phosphatase: 98 units/L (ref 39–117)
BUN: 6 mg/dL (ref 6–23)
Cholesterol: 141 mg/dL (ref 0–200)
GFR calc non Af Amer: 90.47 mL/min (ref >60)
Potassium: 4.6 meq/L (ref 3.5–5.1)
Sodium: 144 meq/L (ref 135–145)
Total Protein: 6.8 g/dL (ref 6.0–8.3)
VLDL: 54.8 mg/dL — ABNORMAL HIGH (ref 0.0–40.0)

## 2009-04-17 ENCOUNTER — Ambulatory Visit (HOSPITAL_COMMUNITY): Admission: RE | Admit: 2009-04-17 | Discharge: 2009-04-17 | Payer: Self-pay | Admitting: Internal Medicine

## 2009-04-20 ENCOUNTER — Encounter: Payer: Self-pay | Admitting: Critical Care Medicine

## 2009-04-20 LAB — CBC WITH DIFFERENTIAL/PLATELET
BASO%: 0.2 % (ref 0.0–2.0)
EOS%: 0.3 % (ref 0.0–7.0)
MCH: 31.1 pg (ref 25.1–34.0)
MCHC: 34.7 g/dL (ref 31.5–36.0)
NEUT%: 74.5 % (ref 38.4–76.8)
RBC: 3.13 10*6/uL — ABNORMAL LOW (ref 3.70–5.45)
RDW: 23.1 % — ABNORMAL HIGH (ref 11.2–14.5)
lymph#: 0.7 10*3/uL — ABNORMAL LOW (ref 0.9–3.3)

## 2009-04-20 LAB — COMPREHENSIVE METABOLIC PANEL
ALT: 22 U/L (ref 0–35)
AST: 24 U/L (ref 0–37)
Calcium: 9.2 mg/dL (ref 8.4–10.5)
Chloride: 107 mEq/L (ref 96–112)
Creatinine, Ser: 0.84 mg/dL (ref 0.40–1.20)
Potassium: 4.3 mEq/L (ref 3.5–5.3)
Sodium: 143 mEq/L (ref 135–145)

## 2009-04-22 ENCOUNTER — Ambulatory Visit: Admission: RE | Admit: 2009-04-22 | Discharge: 2009-05-20 | Payer: Self-pay | Admitting: Radiation Oncology

## 2009-05-20 ENCOUNTER — Encounter: Payer: Self-pay | Admitting: Critical Care Medicine

## 2009-06-10 ENCOUNTER — Encounter: Payer: Self-pay | Admitting: Critical Care Medicine

## 2009-06-11 ENCOUNTER — Ambulatory Visit: Payer: Self-pay | Admitting: Internal Medicine

## 2009-06-15 ENCOUNTER — Ambulatory Visit (HOSPITAL_COMMUNITY): Admission: RE | Admit: 2009-06-15 | Discharge: 2009-06-15 | Payer: Self-pay | Admitting: Internal Medicine

## 2009-06-15 LAB — COMPREHENSIVE METABOLIC PANEL
ALT: 15 U/L (ref 0–35)
AST: 18 U/L (ref 0–37)
Albumin: 3.6 g/dL (ref 3.5–5.2)
Alkaline Phosphatase: 86 U/L (ref 39–117)
BUN: 9 mg/dL (ref 6–23)
Calcium: 9.6 mg/dL (ref 8.4–10.5)
Chloride: 102 mEq/L (ref 96–112)
Potassium: 4 mEq/L (ref 3.5–5.3)

## 2009-06-15 LAB — CBC WITH DIFFERENTIAL/PLATELET
BASO%: 0.4 % (ref 0.0–2.0)
Basophils Absolute: 0 10*3/uL (ref 0.0–0.1)
EOS%: 2.5 % (ref 0.0–7.0)
HGB: 13 g/dL (ref 11.6–15.9)
MCH: 30.7 pg (ref 25.1–34.0)
MCHC: 33.8 g/dL (ref 31.5–36.0)
MCV: 91 fL (ref 79.5–101.0)
MONO%: 12.5 % (ref 0.0–14.0)
RBC: 4.24 10*6/uL (ref 3.70–5.45)
RDW: 16.5 % — ABNORMAL HIGH (ref 11.2–14.5)
lymph#: 0.6 10*3/uL — ABNORMAL LOW (ref 0.9–3.3)

## 2009-06-18 ENCOUNTER — Encounter: Payer: Self-pay | Admitting: Critical Care Medicine

## 2009-07-29 ENCOUNTER — Ambulatory Visit: Payer: Self-pay | Admitting: Internal Medicine

## 2009-07-29 DIAGNOSIS — Z87891 Personal history of nicotine dependence: Secondary | ICD-10-CM | POA: Insufficient documentation

## 2009-07-29 LAB — CONVERTED CEMR LAB
Albumin: 3.7 g/dL (ref 3.5–5.2)
Alkaline Phosphatase: 92 units/L (ref 39–117)
BUN: 10 mg/dL (ref 6–23)
Calcium: 9.4 mg/dL (ref 8.4–10.5)
Creatinine, Ser: 0.8 mg/dL (ref 0.4–1.2)
GFR calc non Af Amer: 77.48 mL/min (ref >60)
Glucose, Bld: 156 mg/dL — ABNORMAL HIGH (ref 70–99)
Hgb A1c MFr Bld: 6.2 % (ref 4.6–6.5)
Sodium: 141 meq/L (ref 135–145)

## 2009-08-26 ENCOUNTER — Ambulatory Visit: Payer: Self-pay | Admitting: Internal Medicine

## 2009-08-27 ENCOUNTER — Ambulatory Visit (HOSPITAL_COMMUNITY): Admission: RE | Admit: 2009-08-27 | Discharge: 2009-08-27 | Payer: Self-pay | Admitting: Radiation Oncology

## 2009-09-02 ENCOUNTER — Encounter: Payer: Self-pay | Admitting: Critical Care Medicine

## 2009-09-10 ENCOUNTER — Ambulatory Visit (HOSPITAL_COMMUNITY): Admission: RE | Admit: 2009-09-10 | Discharge: 2009-09-10 | Payer: Self-pay | Admitting: Internal Medicine

## 2009-09-10 LAB — COMPREHENSIVE METABOLIC PANEL
ALT: 11 U/L (ref 0–35)
AST: 16 U/L (ref 0–37)
Albumin: 3.9 g/dL (ref 3.5–5.2)
Alkaline Phosphatase: 108 U/L (ref 39–117)
BUN: 12 mg/dL (ref 6–23)
Calcium: 9.3 mg/dL (ref 8.4–10.5)
Chloride: 106 mEq/L (ref 96–112)
Potassium: 4.3 mEq/L (ref 3.5–5.3)
Sodium: 140 mEq/L (ref 135–145)
Total Protein: 6.8 g/dL (ref 6.0–8.3)

## 2009-09-10 LAB — CBC WITH DIFFERENTIAL/PLATELET
Eosinophils Absolute: 0.1 10*3/uL (ref 0.0–0.5)
HCT: 41.2 % (ref 34.8–46.6)
LYMPH%: 16 % (ref 14.0–49.7)
MCHC: 33.7 g/dL (ref 31.5–36.0)
MCV: 92.1 fL (ref 79.5–101.0)
MONO#: 0.5 10*3/uL (ref 0.1–0.9)
MONO%: 9.5 % (ref 0.0–14.0)
NEUT#: 3.5 10*3/uL (ref 1.5–6.5)
NEUT%: 72.6 % (ref 38.4–76.8)
Platelets: 192 10*3/uL (ref 145–400)
RBC: 4.47 10*6/uL (ref 3.70–5.45)
WBC: 4.9 10*3/uL (ref 3.9–10.3)

## 2009-09-14 ENCOUNTER — Encounter: Payer: Self-pay | Admitting: Critical Care Medicine

## 2009-10-30 ENCOUNTER — Ambulatory Visit: Payer: Self-pay | Admitting: Internal Medicine

## 2009-10-30 IMAGING — CT CT ANGIO CHEST
3 of 8 series · 19 of 36 positions shown · IV contrast (APPLIED)
Comparison: Chest radiograph today at 12/27/2008

CLINICAL DATA: Abnormal chest radiograph. Chest pain smoker

CT ANGIOGRAPHY CHEST
TECHNIQUE: Multidetector CT imaging of the chest using the
standard protocol during bolus administration of intravenous
contrast. Multiplanar reconstructed images including MIPs were
obtained and reviewed to evaluate the vascular anatomy.
Contrast: 80 ml Qmnipaque-PUU

[Series 9: pulm embolism 1.0 b25f thins · axial · 0.63mm/px · z∈[-13,+198]mm · 16 of 241 slices shown]
[im 15/241  lung]
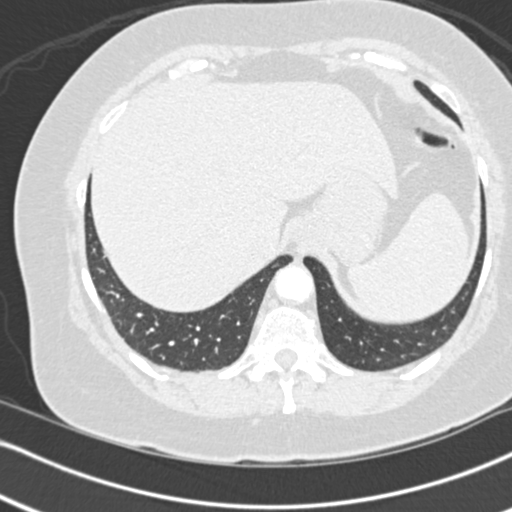
[im 29/241  mediastinal]
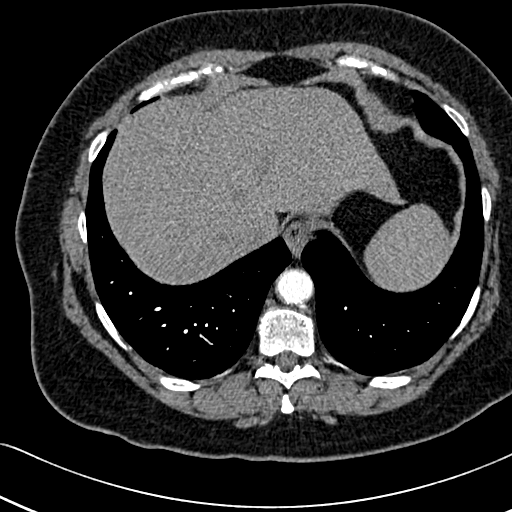
[im 43/241  lung]
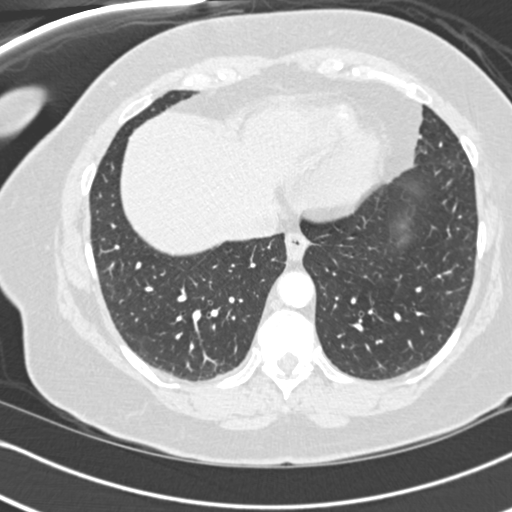
[im 57/241  mediastinal]
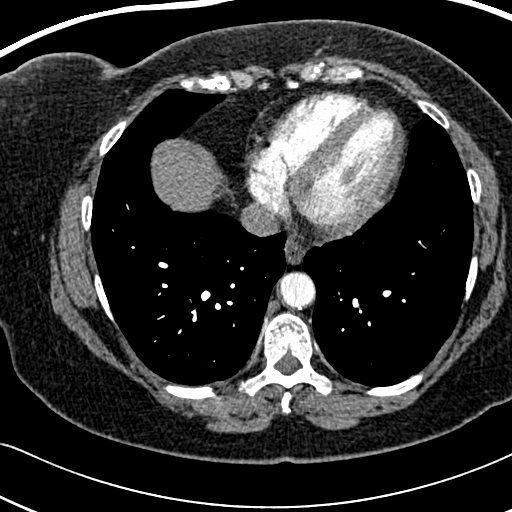
[im 71/241  lung]
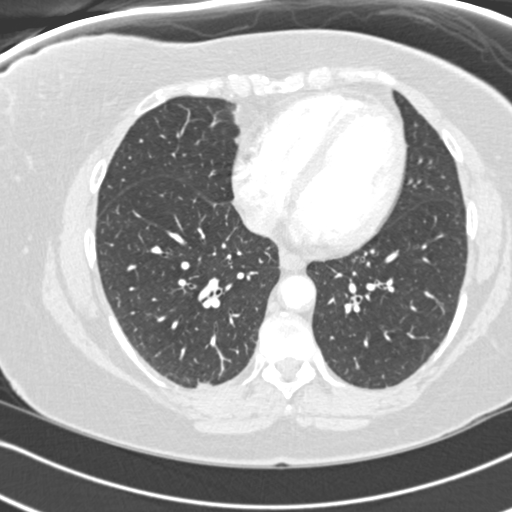
[im 85/241  mediastinal]
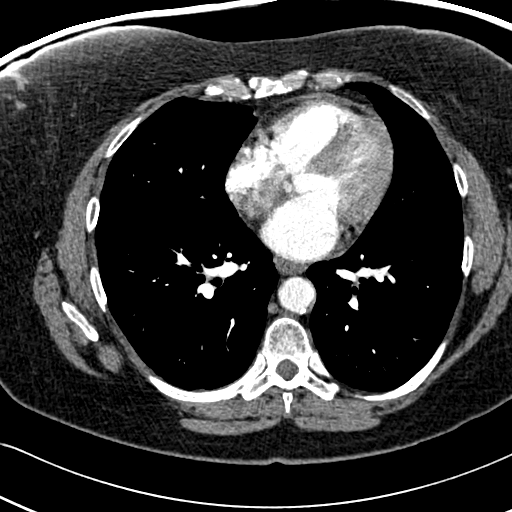
[im 99/241  lung]
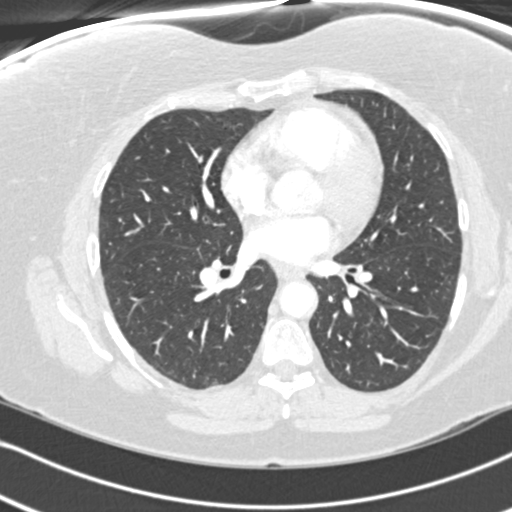
[im 113/241  mediastinal]
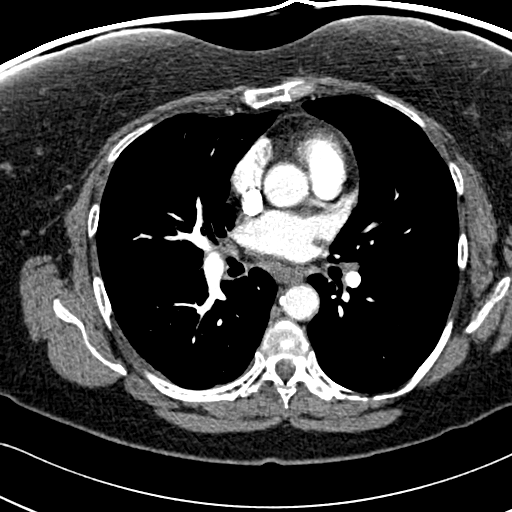
[im 128/241  lung]
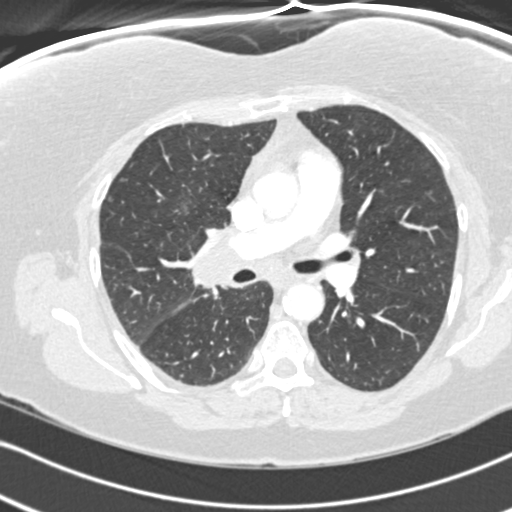
[im 142/241  mediastinal]
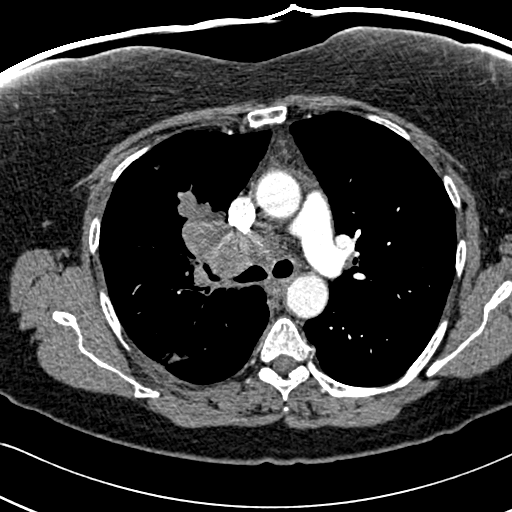
[im 156/241  lung]
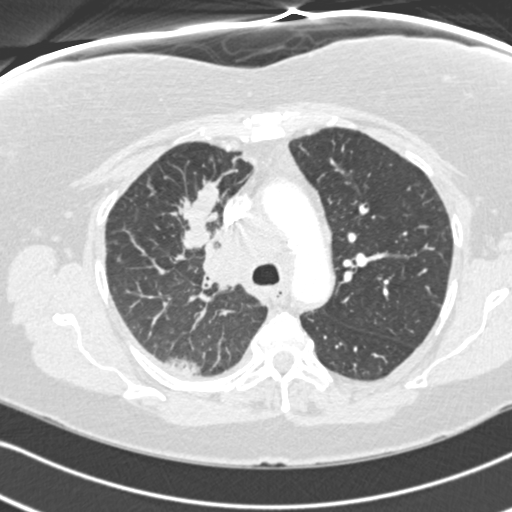
[im 170/241  mediastinal]
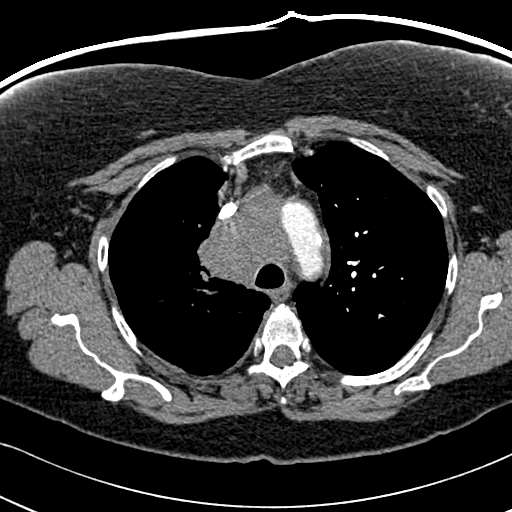
[im 184/241  lung]
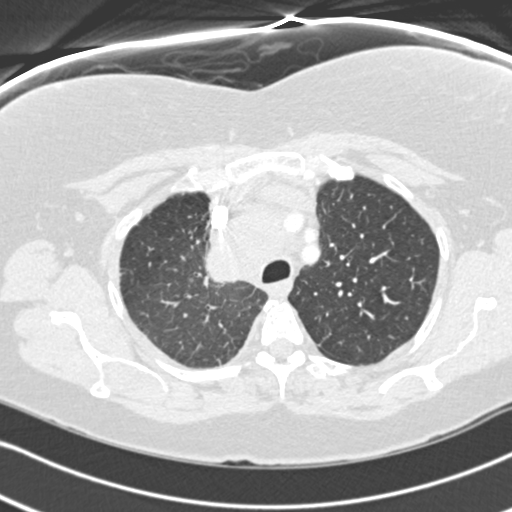
[im 198/241  mediastinal]
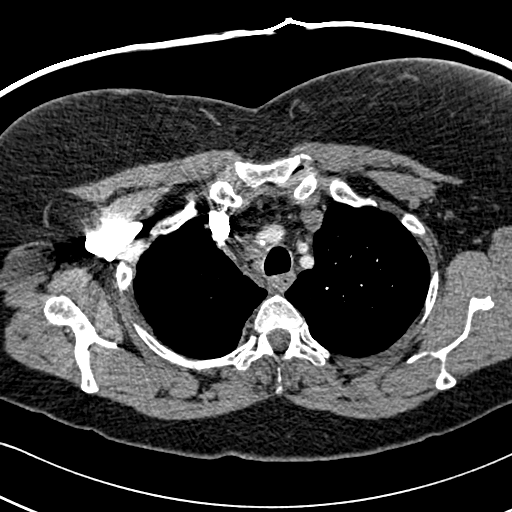
[im 212/241  lung]
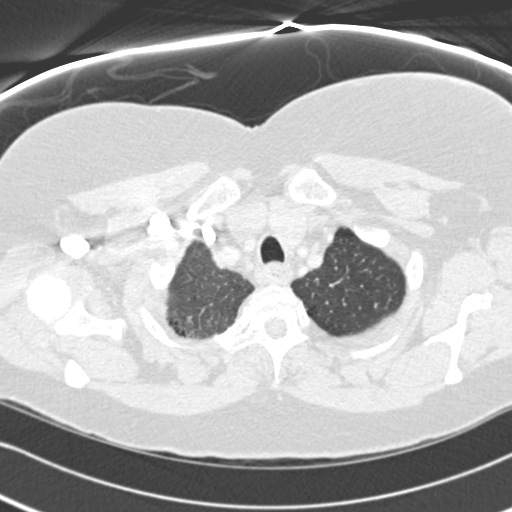
[im 226/241  mediastinal]
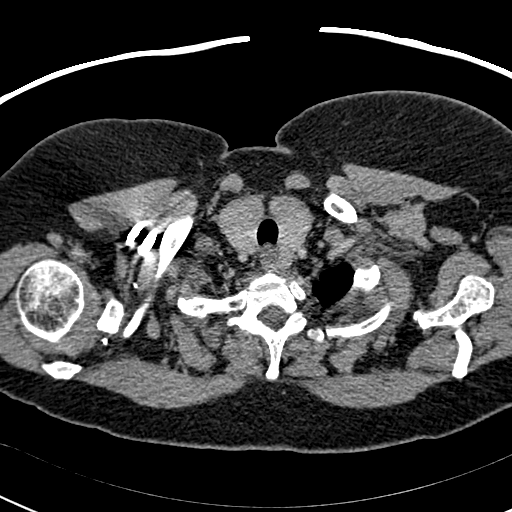

[Series 12: pulm embolism 3.0 b25f thins · axial · 0.63mm/px · z∈[-63,-45]mm · 2 of 50 slices shown]
[im 17/50  lung]
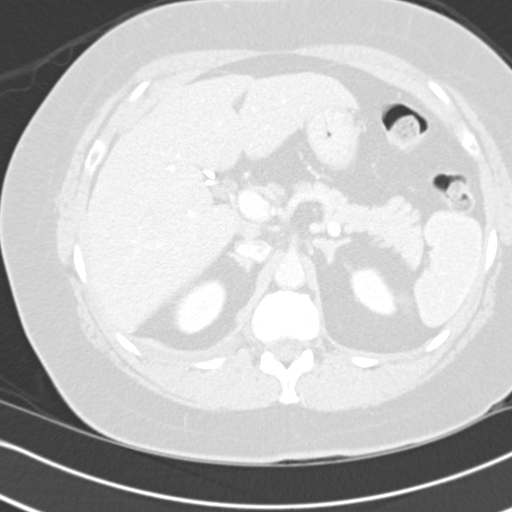
[im 33/50  lung]
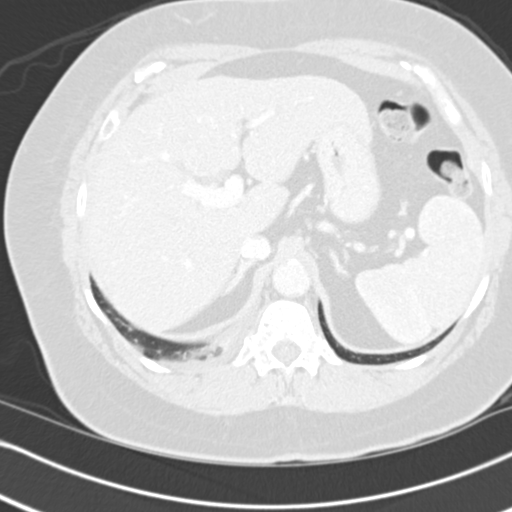

[Series 602: cor chest · coronal · 0.63mm/px · 1 of 115 slices shown]
[im 58/115  mediastinal]
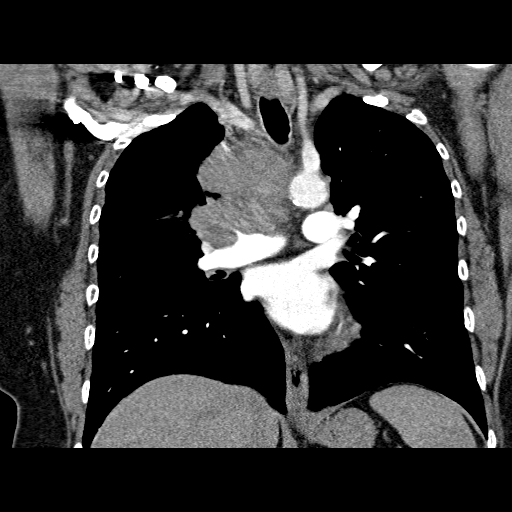

[19 of 36 positions shown; findings below may reference images not displayed]

FINDINGS: Focal ground-glass opacity anterior segment of the right
upper lobe and also focal airspace density in the posterolateral
aspect of the right upper lobe.  Findings are suspicious for
pneumonia.  Negative for PE.  There is extensive mediastinal
adenopathy.  Marked involvement of the pretracheal and right
paratracheal spaces with extension of adenopathy into the right
suprahilar and perihilar regions and projecting anteriorly and
superiorly from the right hilus into the right upper lobe.  The
adenopathy is confluent.  The right paratracheal and pretracheal
nodal mass measures 3.9 x 5.8 cm.
IMPRESSION: Negative for PE.  Right upper lobe airspace opacities, nonspecific.
Pneumonia cannot be excluded.  Extensive mediastinal and right
hilar adenopathy of undetermined etiology.  Main considerations are
primary lung carcinoma and lymphoma.

## 2009-10-30 IMAGING — CR DG CHEST 2V
2 series · 2 of 2 positions shown · non-contrast
Comparison: 07/30/2007

CLINICAL DATA: Right back pain

CHEST - 2 VIEW

[w chest pa]
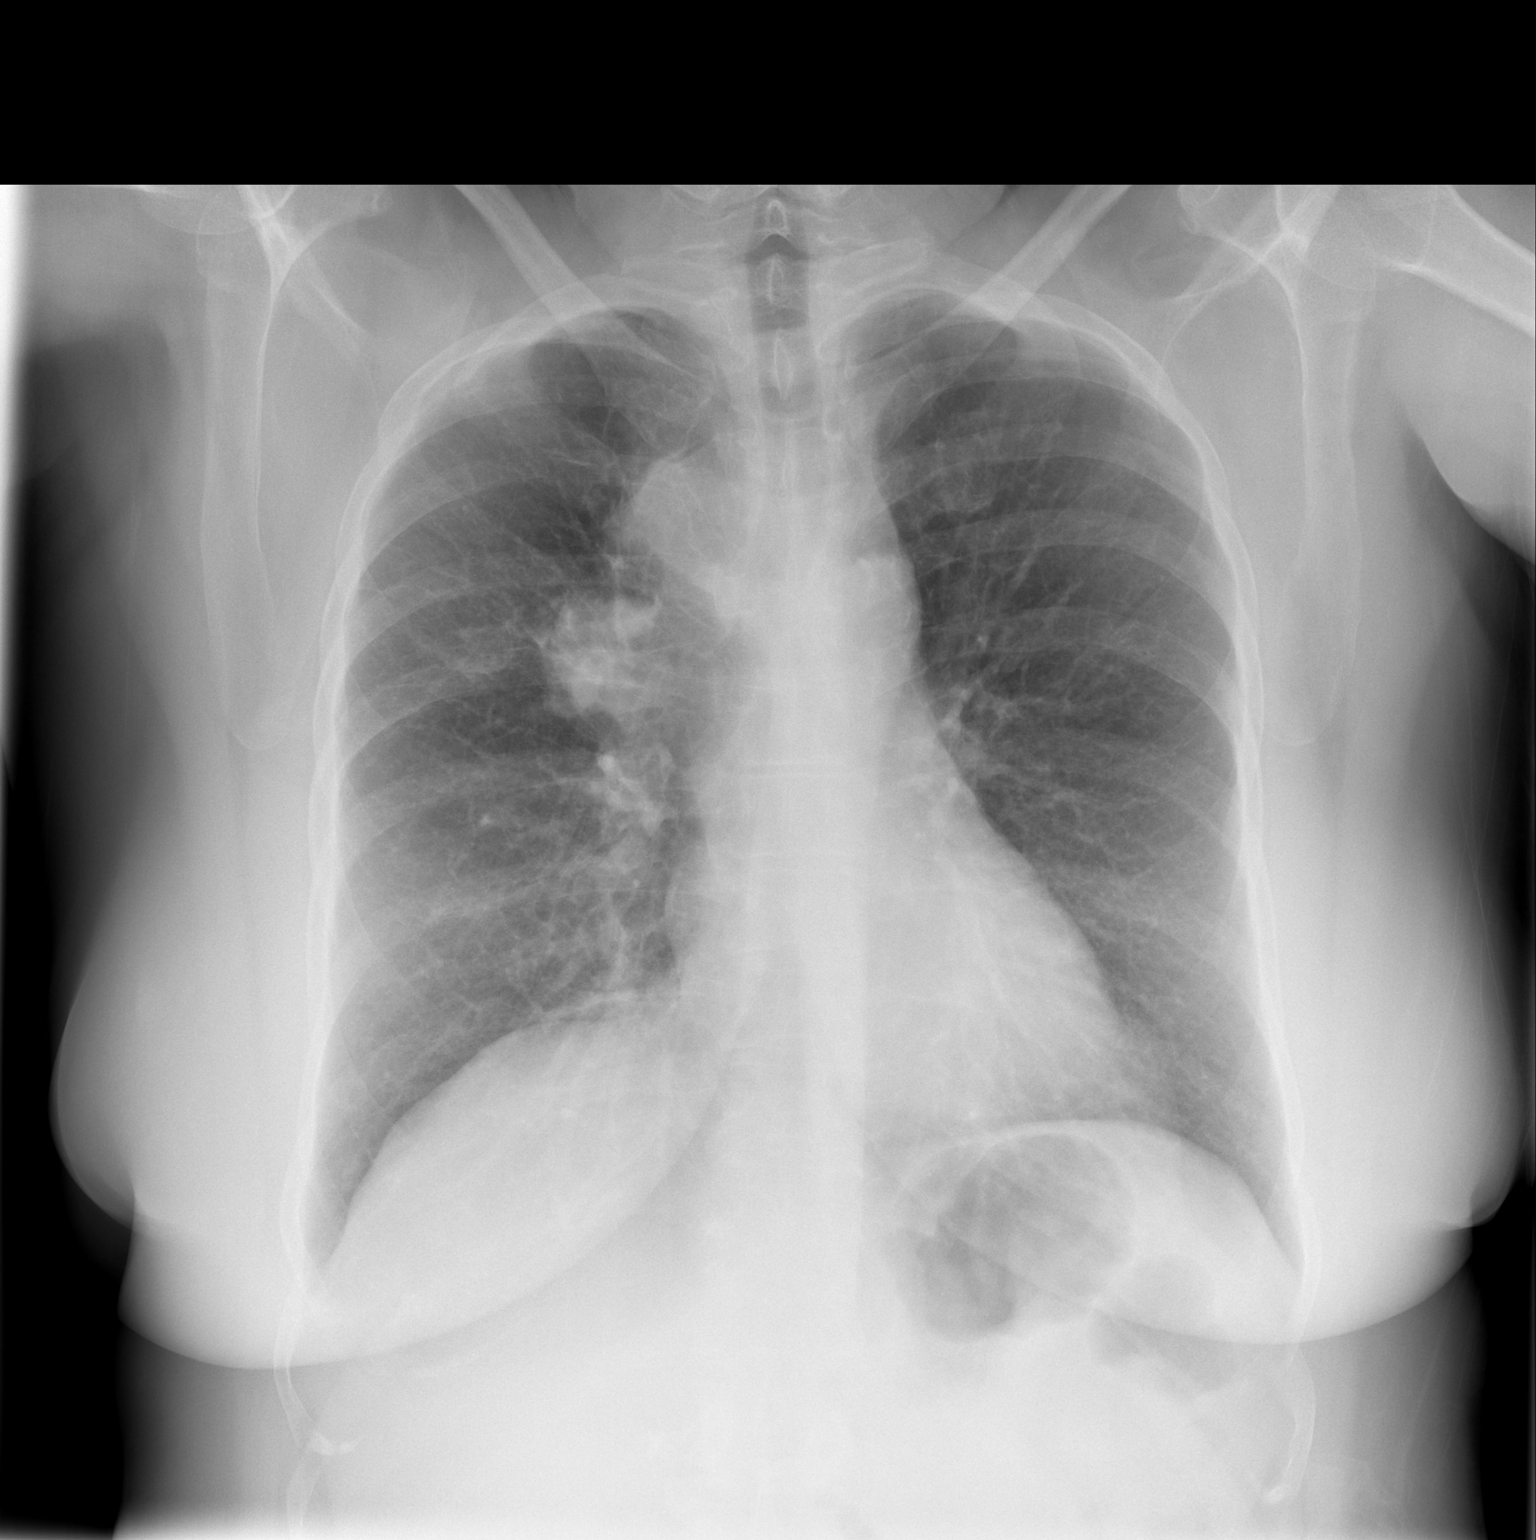

[w chest lat]
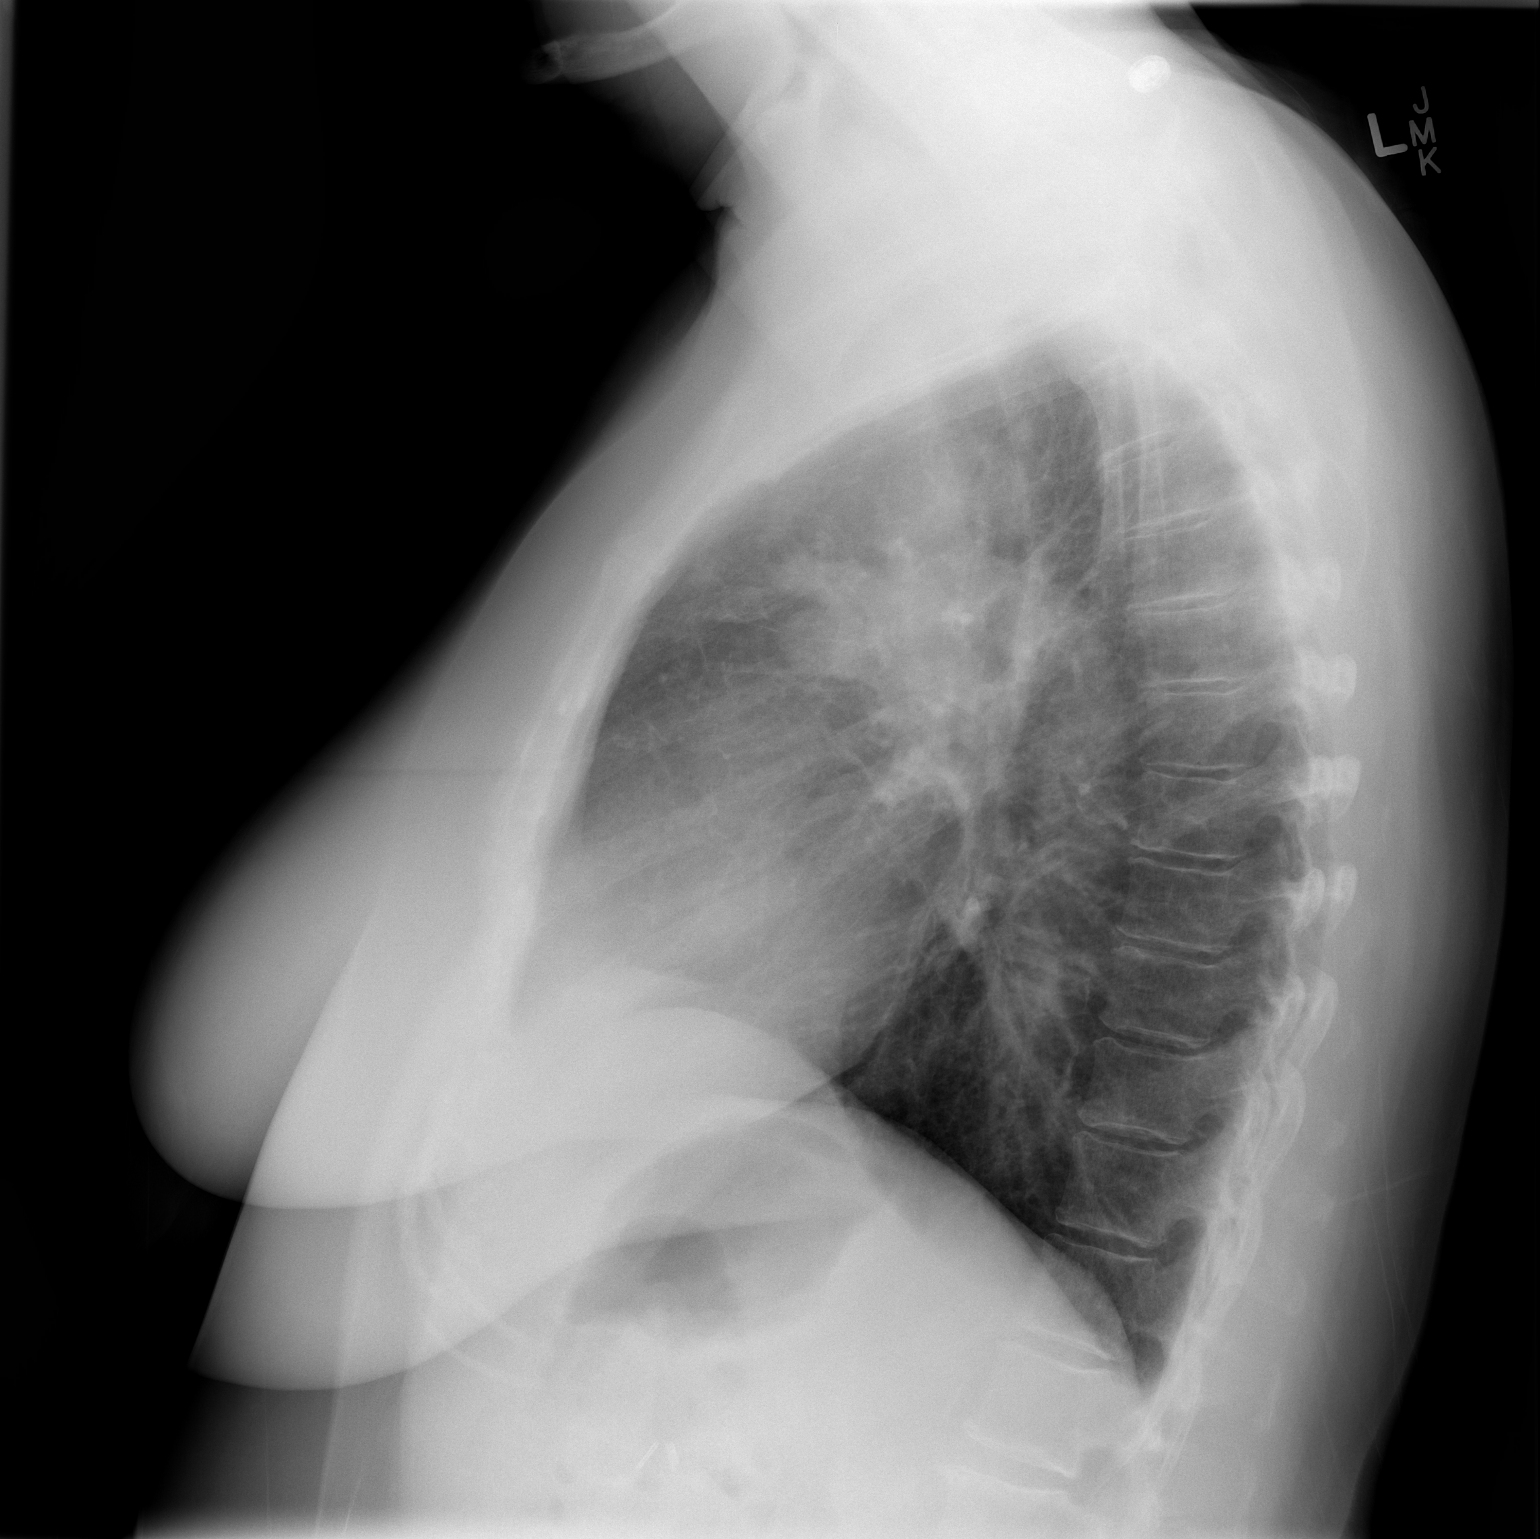

[2 of 2 positions shown; findings below may reference images not displayed]

FINDINGS: There is a right suprahilar mass or adenopathy as well as
probable right paratracheal adenopathy.  Left lung remains clear.
Heart size normal.  No effusion. Visualized bones unremarkable.
Vascular clips in the upper abdomen.
IMPRESSION: 1.  Right suprahilar and paratracheal mass and/or adenopathy.
Findings are new since prior exam.  Recommend CT chest with
contrast to evaluate possible neoplasm.

## 2009-11-02 LAB — CONVERTED CEMR LAB
Cholesterol: 224 mg/dL — ABNORMAL HIGH (ref 0–200)
GFR calc non Af Amer: 90.31 mL/min (ref >60)
HDL: 33.1 mg/dL — ABNORMAL LOW (ref >39.00)
Hgb A1c MFr Bld: 6 % (ref 4.6–6.5)
Potassium: 4.3 meq/L (ref 3.5–5.1)
Sodium: 144 meq/L (ref 135–145)
VLDL: 24.4 mg/dL (ref 0.0–40.0)

## 2009-11-10 IMAGING — CR DG CHEST 2V
2 series · 2 of 2 positions shown · non-contrast
Comparison: 12/27/2008.

CLINICAL DATA: Mediastinal mass.

CHEST - 2 VIEW

[view not recorded (1 of 2)]
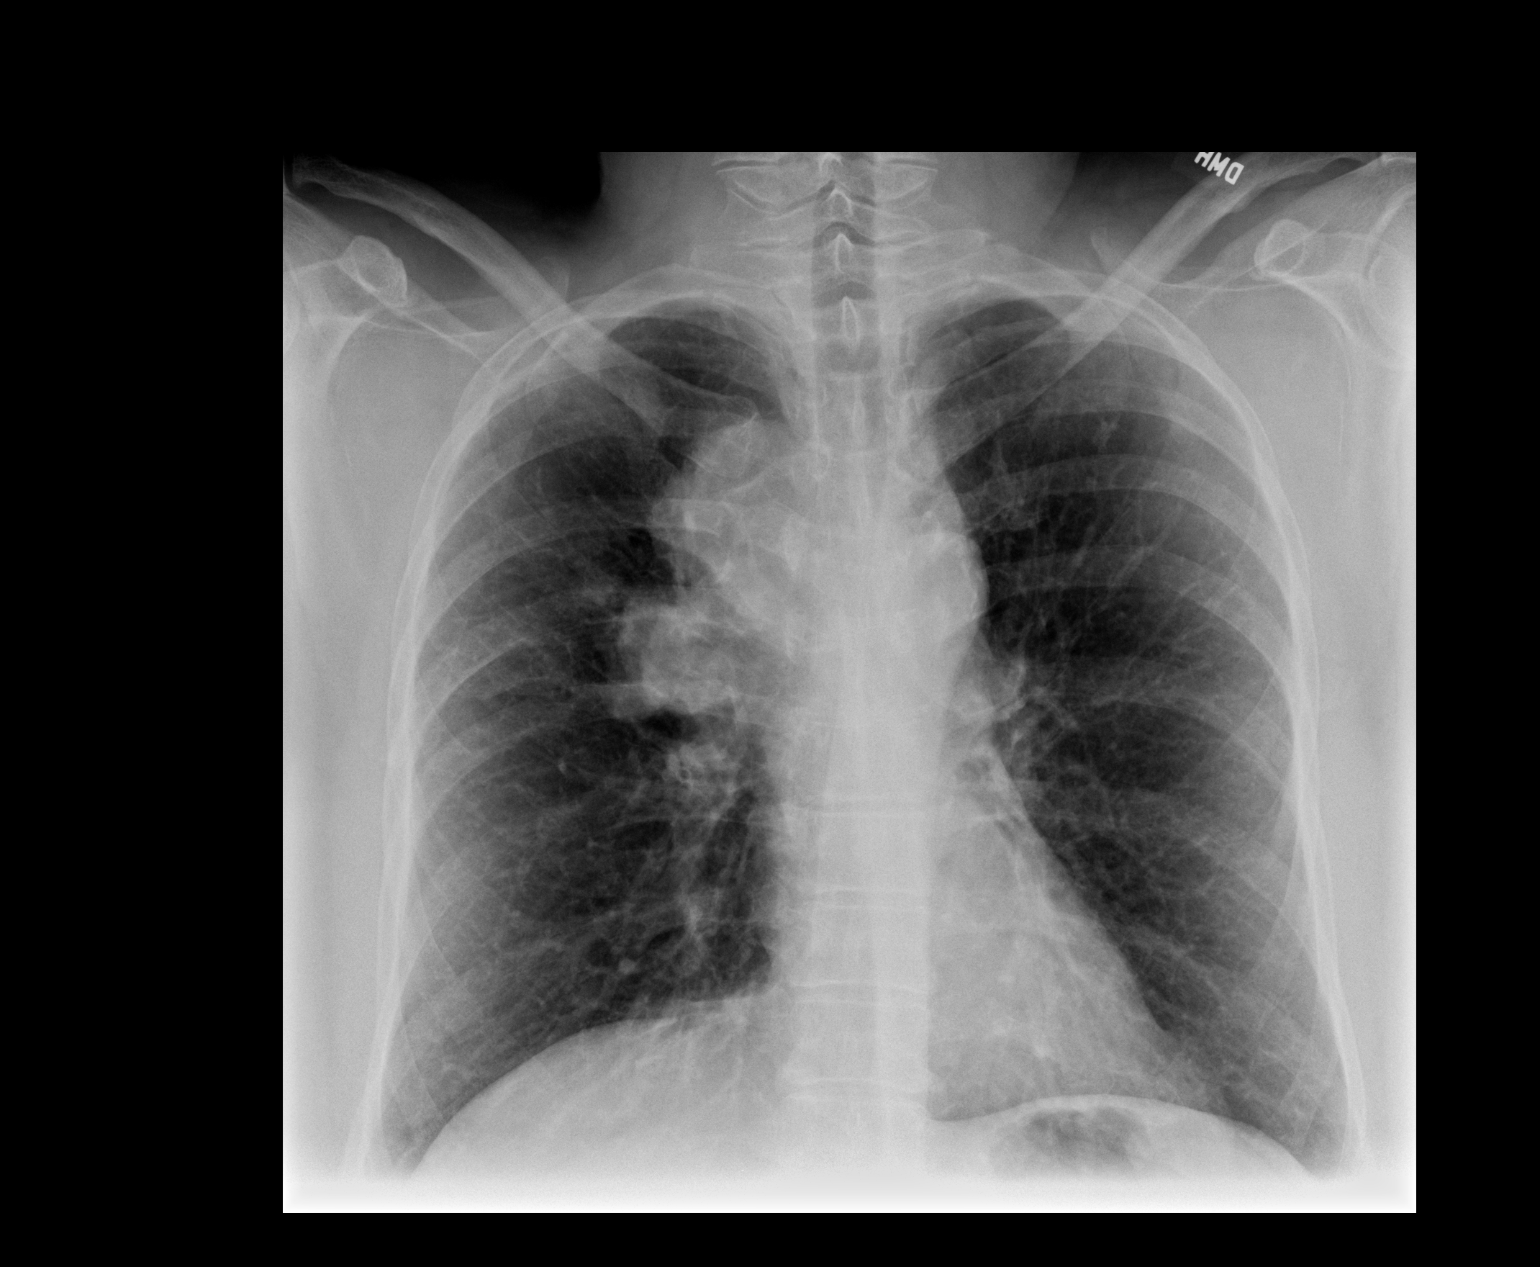

[view not recorded (2 of 2)]
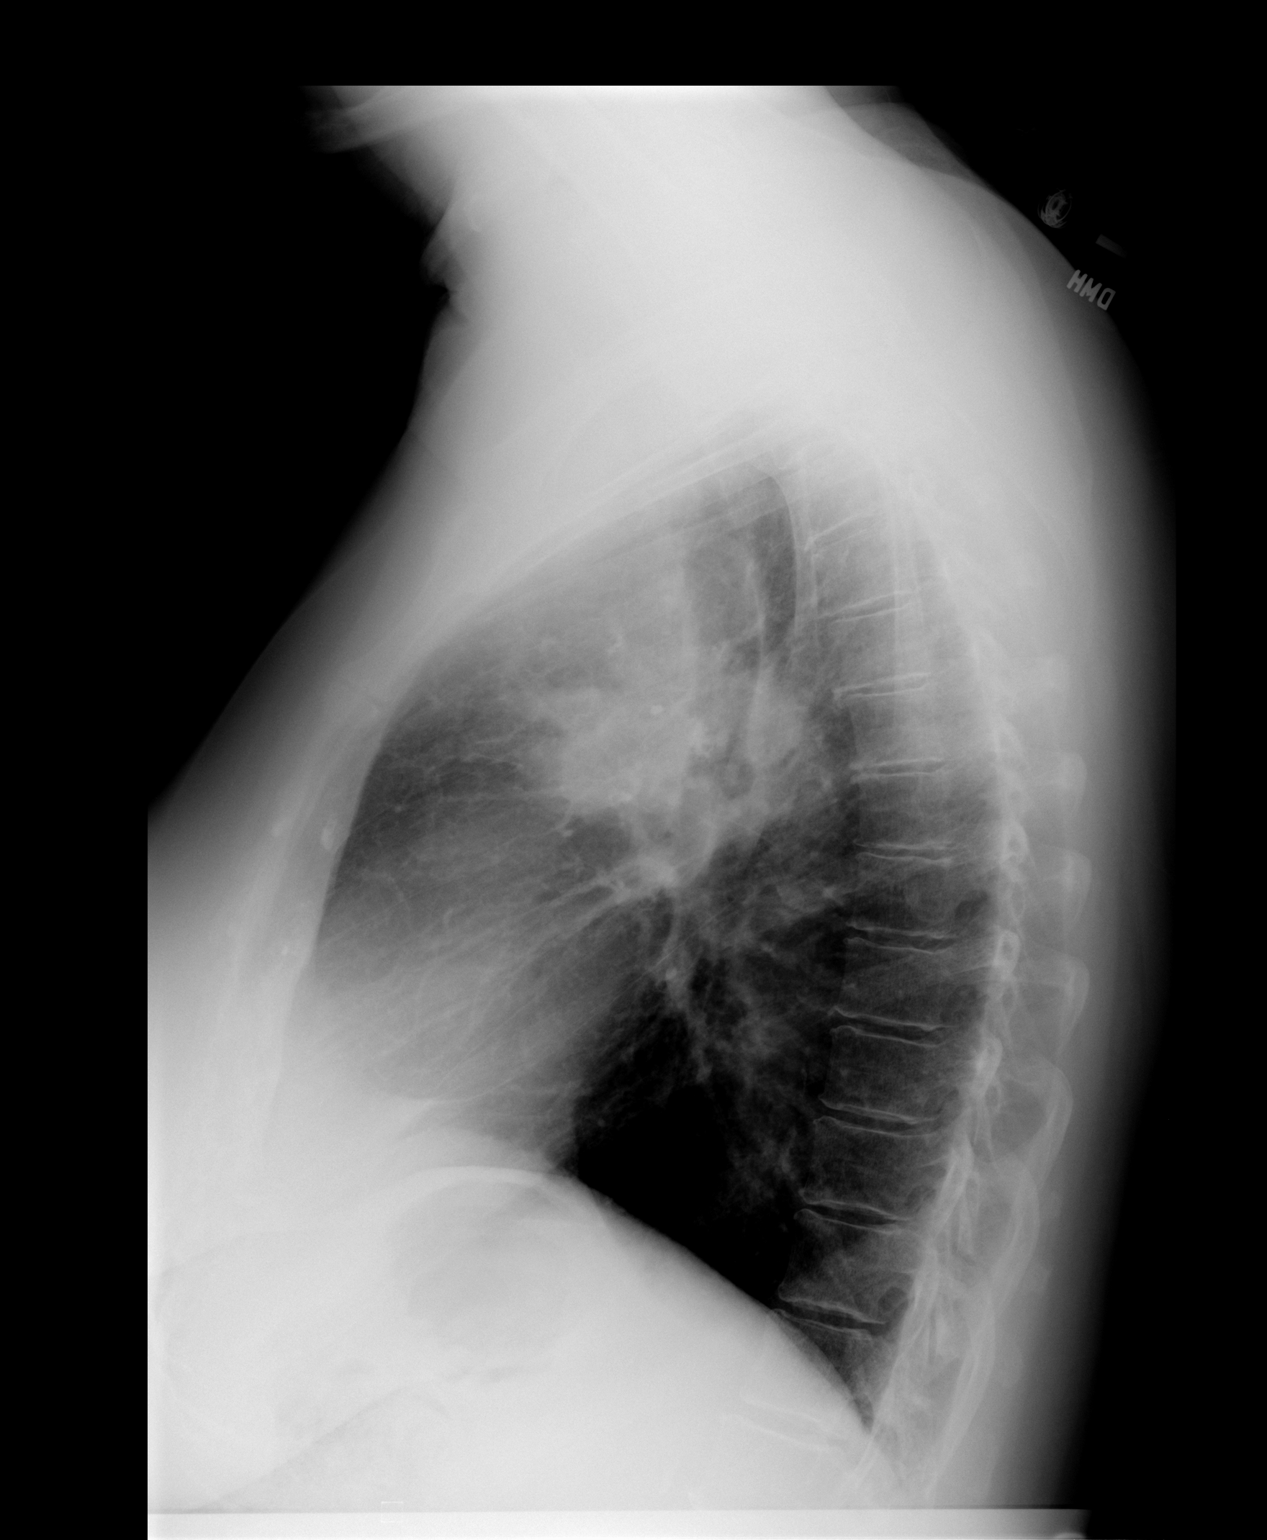

[2 of 2 positions shown; findings below may reference images not displayed]

FINDINGS: The right upper hilar and suprahilar/paramediastinal
masses appears similar in size.  More accurate determination could
be obtained with CT for follow-up.  Mild diffuse chronic
interstitial lung changes.  No acute pulmonary process.  Normal
cardiac size.
IMPRESSION: By plain radiographic assessment the right suprahilar and
mediastinal masses appear approximately the same in degree.

## 2009-11-17 IMAGING — CT NM PET TUM IMG RESTAG (PS) SKULL BASE T - THIGH
7 series · 25 of 25 positions shown · IV contrast (350 OM)
Comparison: Chest CT 12/27/2008

CLINICAL DATA: Recent diagnosis of small cell undifferentiated
lung cancer.

INITIAL NUCLEAR MEDICINE FDG PET CT TUMOR IMAGING- (SKULL BASE
THROUGH THIGHS)
TECHNIQUE: 18.7 mCi F-18 FDG was injected intravenously via the
right antecubital fossa.  Full-ring PET imaging was performed from
the skull base through the mid-thighs 54  minutes after injection.
CT data was obtained and used for attenuation correction and
anatomic localization only.  (This was not acquired as a diagnostic
CT examination.)
Fasting Blood Glucose:  132

[Series 1: pet ac · axial · 3.3mm · 4.69mm/px · z∈[-753,-27]mm · 5 of 223 slices shown]
[im 1/223]
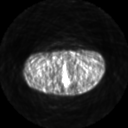
[im 56/223]
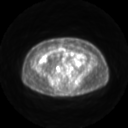
[im 112/223]
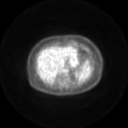
[im 167/223]
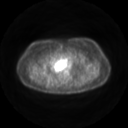
[im 223/223]
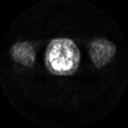

[Series 2: pet nac · axial · 3.3mm · 4.69mm/px · z∈[-753,-27]mm · 6 of 223 slices shown]
[im 1/223]
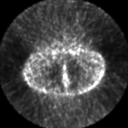
[im 45/223]
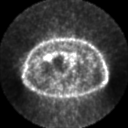
[im 89/223]
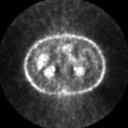
[im 134/223]
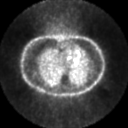
[im 178/223]
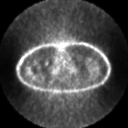
[im 223/223]
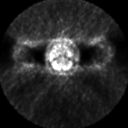

[Series 2: ct images · axial · 3.8mm · 0.98mm/px · z∈[-753,-27]mm · 5 of 223 slices shown]
[im 1/223]
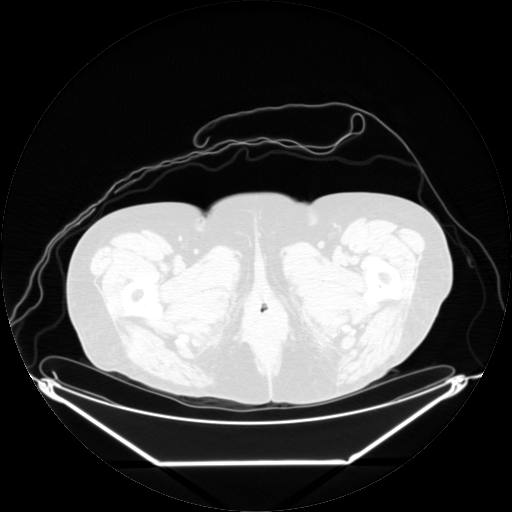
[im 56/223]
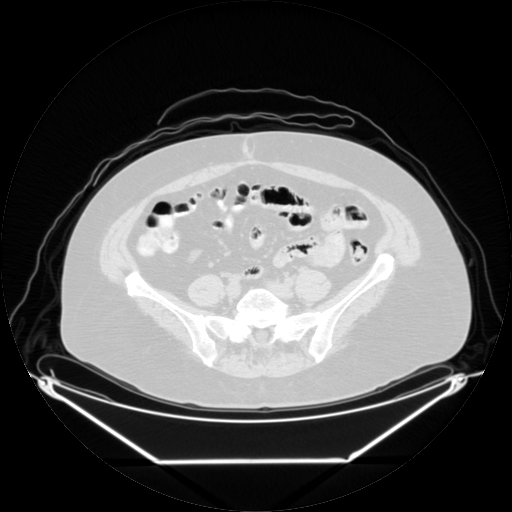
[im 112/223]
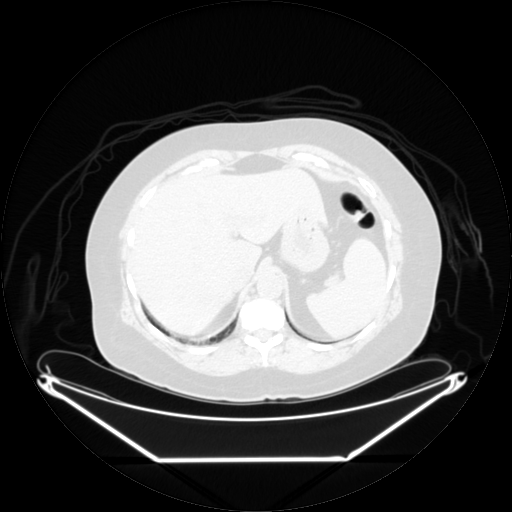
[im 167/223]
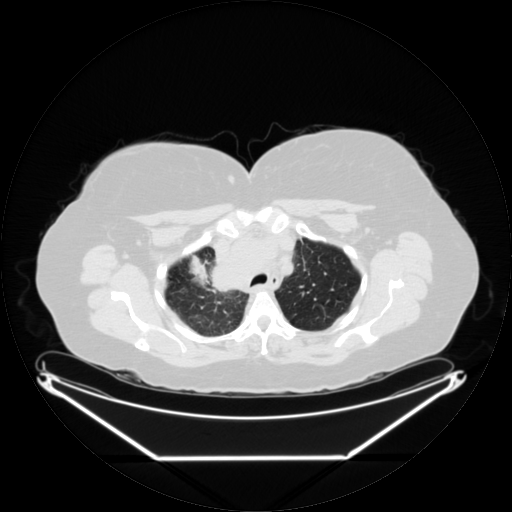
[im 223/223]
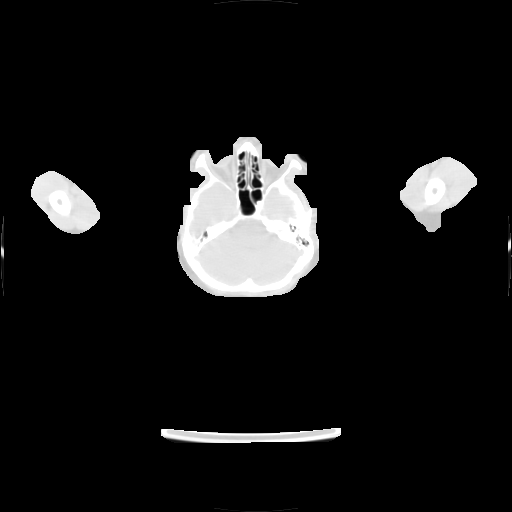

[Series 123: mip · coronal · 3.3mm · 4.69mm/px · 1 of 30 slices shown]
[im 1/30]
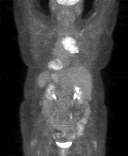

[Series 150: reformatted · axial · 3.3mm · 1.02mm/px · 1 of 3 slices shown (1 of 3)]
[im 1/3]
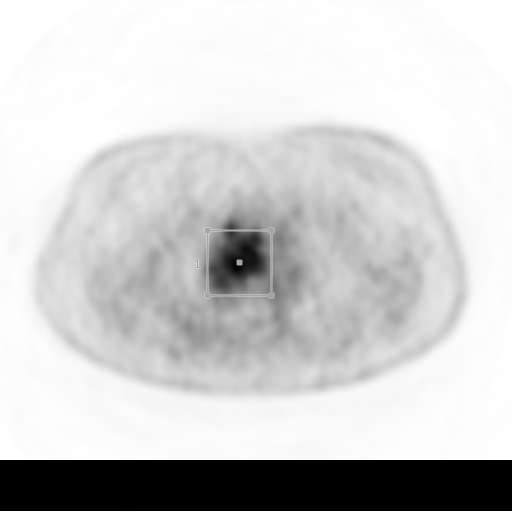

[Series 151: reformatted · axial · 3.3mm · 3.91mm/px · z∈[-743,-53]mm · 5 of 210 slices shown (2 of 3)]
[im 1/210]
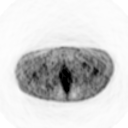
[im 53/210]
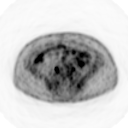
[im 105/210]
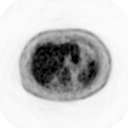
[im 157/210]
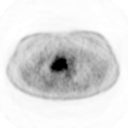
[im 210/210]
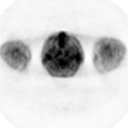

[Series 153: reformatted · coronal · 4.7mm · 5.83mm/px · 2 of 61 slices shown (3 of 3)]
[im 1/61]
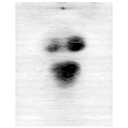
[im 61/61]
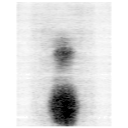

[25 of 25 positions shown; findings below may reference images not displayed]

FINDINGS: The large confluent nodal mass in the superior
mediastinum, right hilum and right perihilar regions is markedly
hypermetabolic.  This demonstrates an SUV max of 12.3 and
demonstrates homogeneously increased metabolic activity.  The
increased metabolic activity extends centrally into the right upper
and middle lobes.  Apart from the dominant nodal mass, there is a
single retrosternal node on the left which is hypermetabolic,
measuring 1 cm short axis dimension on image 56.  No other separate
hypermetabolic nodal activity seen within the chest.

There is no hypermetabolic nodal activity within the neck, abdomen
or pelvis.  The thyroid gland is mildly enlarged with a 1.3 cm low
density lesion in the right lobe on image 48.  There is no
associated abnormal metabolic activity.  Linear activity in the
right axilla appears to be related to the injection and is
associated with some air within the right axillary and subclavian
veins.  There is no evidence metastatic disease to the liver or
adrenal glands.

On review of the CT images, there is increased patchy airspace
disease peripherally in the right upper lobe on images 52 through
59, probably reflecting postobstructive pneumonitis.  Patchy
parenchymal opacity more posteriorly in the right upper lobe on
image 69 has also increased.  There is no associated significantly
increased metabolic activity in these areas.
IMPRESSION: 1.  The large confluent nodal mass in the superior mediastinum,
right hilum and perihilar regions is markedly hypermetabolic.  This
corresponds with the given history of undifferentiated small cell
carcinoma.
2.  No suspicious peripheral lung activity.  Increased patchy
opacities in the right upper lobe are not hypermetabolic and
probably reflect postobstructive pneumonitis.
3.  No distant metastases identified.

## 2009-11-19 IMAGING — CT CT HEAD WO/W CM
1 of 2 series · 13 of 30 positions shown, 17 images · IV contrast (APPLIED)
Comparison: 10/22/2004

CLINICAL DATA: History of lung cancer.  Headache.

CT HEAD WITHOUT AND WITH CONTRAST
TECHNIQUE: Contiguous axial images were obtained from the base of
the skull through the vertex without and with intravenous contrast.
Contrast: 100 ml of 9mnipaque-HVV.

[Series 2: head w/o 4.8 h45s · axial · non-contrast · 0.43mm/px · z∈[-164,-50]mm · 13 of 30 slices shown, 17 images]
[im 3/30  brain]
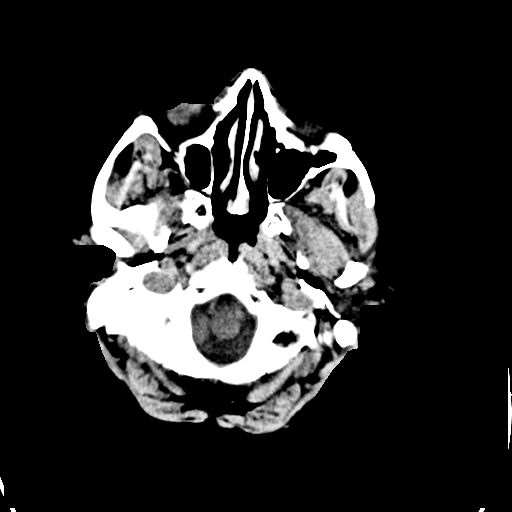
[im 3/30  bone]
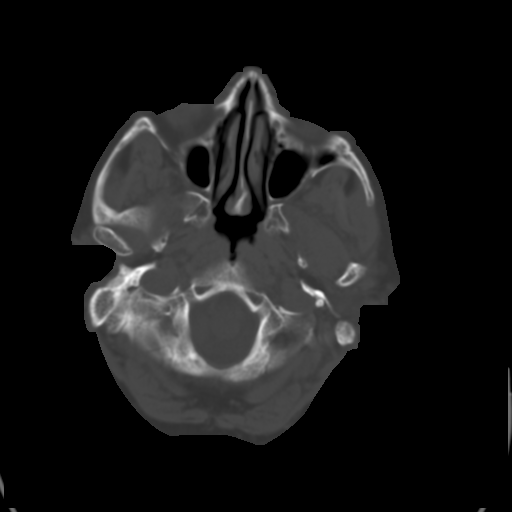
[im 5/30  brain]
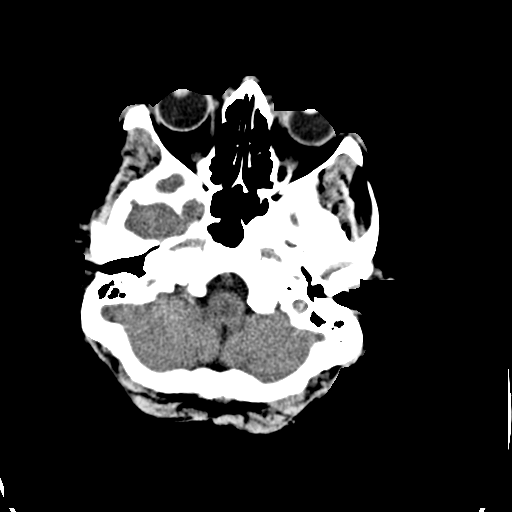
[im 7/30  brain]
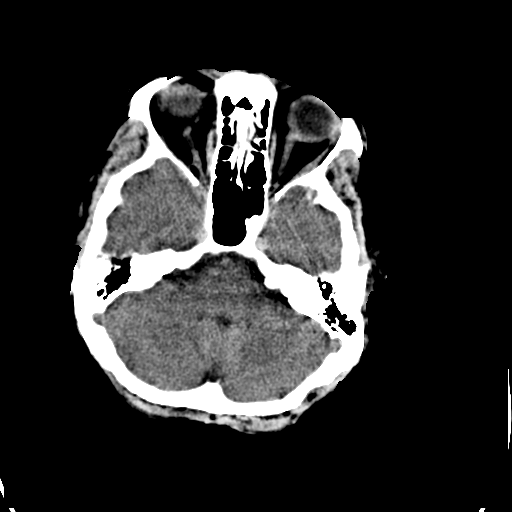
[im 9/30  brain]
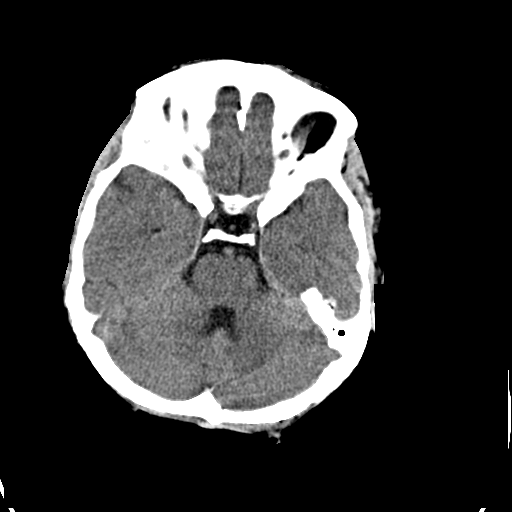
[im 11/30  brain]
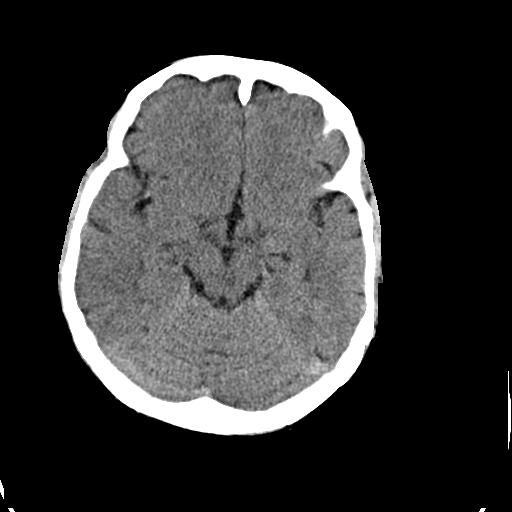
[im 11/30  bone]
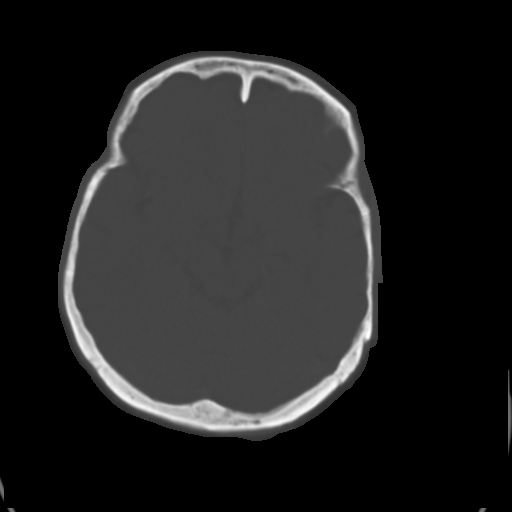
[im 13/30  brain]
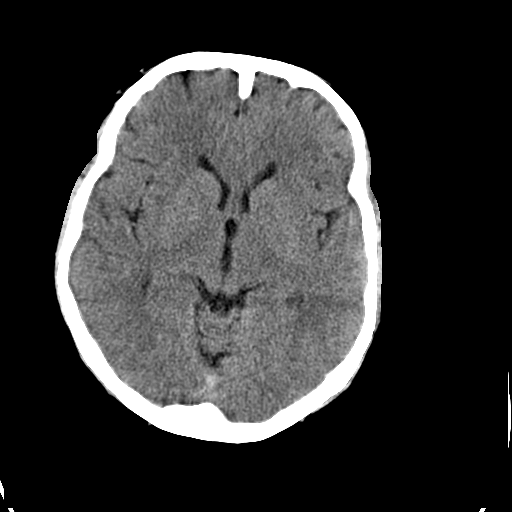
[im 15/30  brain]
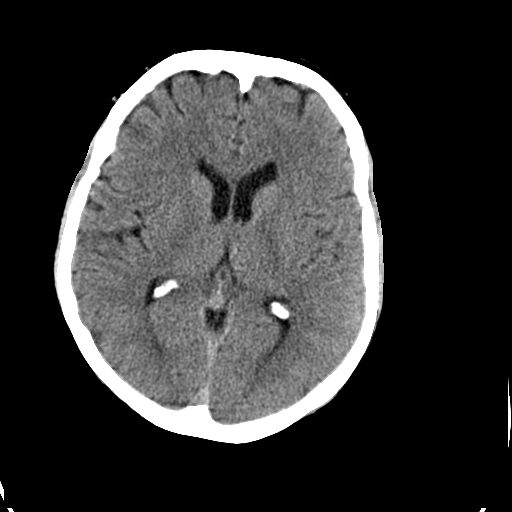
[im 17/30  brain]
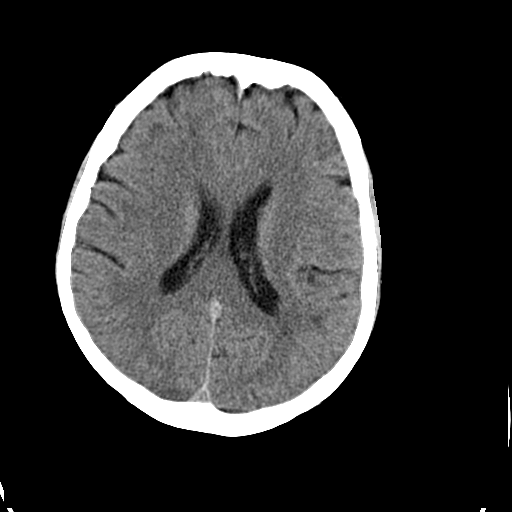
[im 19/30  brain]
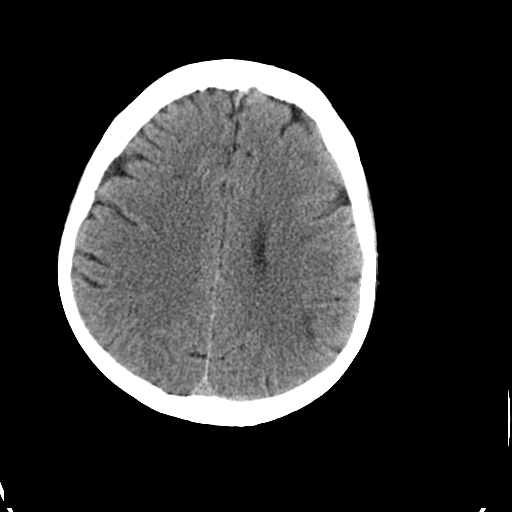
[im 19/30  bone]
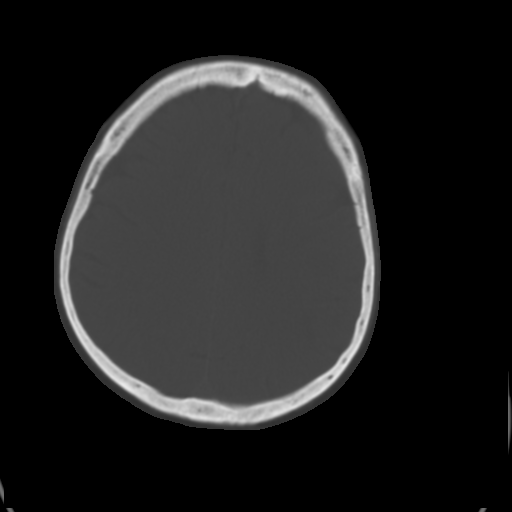
[im 21/30  brain]
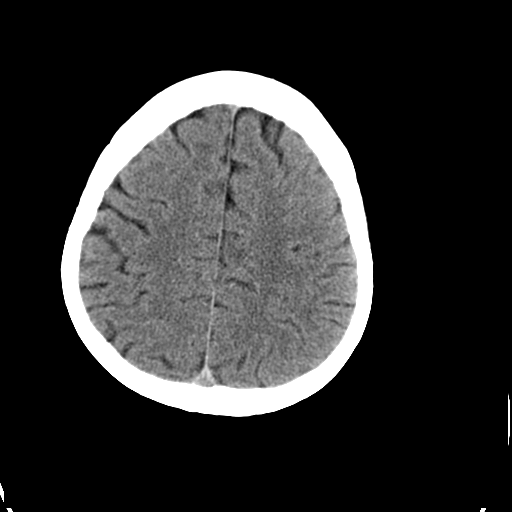
[im 23/30  brain]
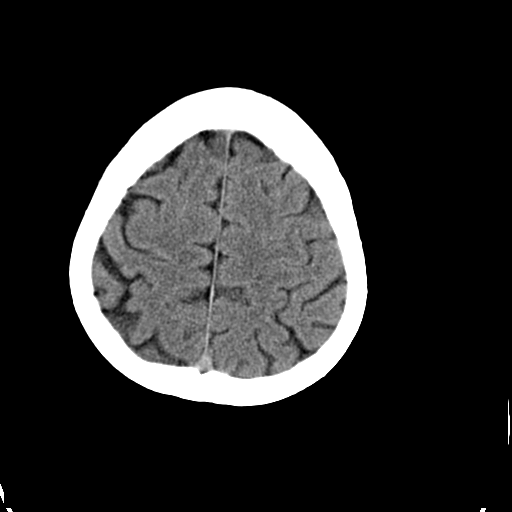
[im 25/30  brain]
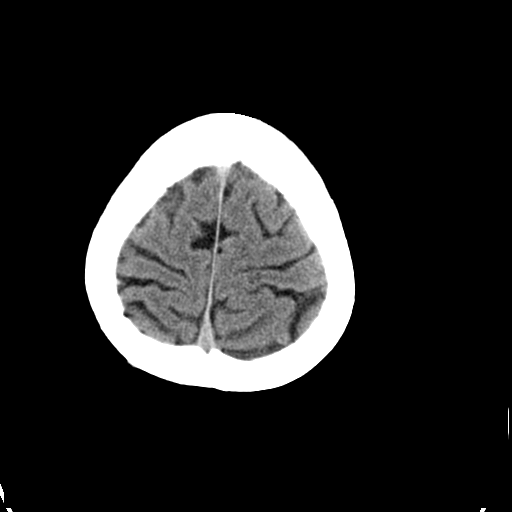
[im 27/30  brain]
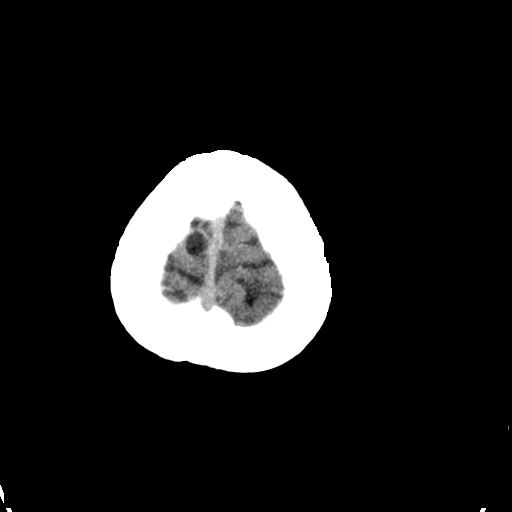
[im 27/30  bone]
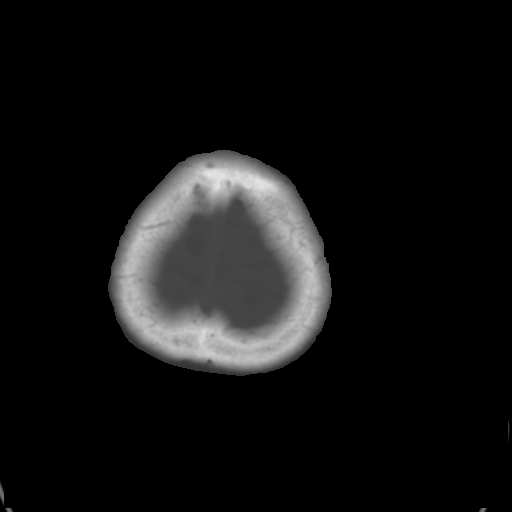

[13 of 30 positions shown; findings below may reference images not displayed]

FINDINGS: The ventricles are normal.  No extra-axial fluid
collections are seen.  No acute intracranial findings or mass
lesions.  No areas of abnormal contrast enhancement are identified.
The major vascular structures appear normal including the dural
venous sinuses.

The bony calvarium is intact.  The visualized paranasal sinuses and
mastoid air cells are clear.
IMPRESSION: 1.  No acute intracranial findings or mass lesions.  Specifically,
no findings to suggest intracranial metastatic disease.
2.  Clear paranasal sinuses.
3.  Intact bony calvarium.

## 2009-12-07 ENCOUNTER — Ambulatory Visit: Payer: Self-pay | Admitting: Internal Medicine

## 2009-12-10 ENCOUNTER — Ambulatory Visit (HOSPITAL_COMMUNITY): Admission: RE | Admit: 2009-12-10 | Discharge: 2009-12-10 | Payer: Self-pay | Admitting: Internal Medicine

## 2009-12-10 LAB — CBC WITH DIFFERENTIAL/PLATELET
Basophils Absolute: 0 10*3/uL (ref 0.0–0.1)
Eosinophils Absolute: 0.1 10*3/uL (ref 0.0–0.5)
HGB: 14 g/dL (ref 11.6–15.9)
MONO#: 0.4 10*3/uL (ref 0.1–0.9)
NEUT#: 3.2 10*3/uL (ref 1.5–6.5)
RBC: 4.28 10*6/uL (ref 3.70–5.45)
RDW: 14.3 % (ref 11.2–14.5)
WBC: 4.8 10*3/uL (ref 3.9–10.3)
lymph#: 0.9 10*3/uL (ref 0.9–3.3)

## 2009-12-10 LAB — COMPREHENSIVE METABOLIC PANEL
Albumin: 3.8 g/dL (ref 3.5–5.2)
Alkaline Phosphatase: 108 U/L (ref 39–117)
CO2: 30 mEq/L (ref 19–32)
Calcium: 9.3 mg/dL (ref 8.4–10.5)
Chloride: 106 mEq/L (ref 96–112)
Glucose, Bld: 76 mg/dL (ref 70–99)
Potassium: 4 mEq/L (ref 3.5–5.3)
Sodium: 142 mEq/L (ref 135–145)
Total Protein: 7.1 g/dL (ref 6.0–8.3)

## 2009-12-15 ENCOUNTER — Encounter: Payer: Self-pay | Admitting: Internal Medicine

## 2009-12-29 IMAGING — CT CT CHEST W/ CM
1 of 2 series · 14 of 30 positions shown, 18 images · IV contrast (agent unspecified)
Comparison: 12/27/2008

CLINICAL DATA: Follow-up lung carcinoma.  Nonproductive cough and
further weight loss.  Ongoing chemotherapy and radiation therapy.

CT CHEST WITH CONTRAST
TECHNIQUE: Multidetector CT imaging of the chest was performed
following the standard protocol during bolus administration of
intravenous contrast.
Contrast: 80 ml 7mnipaque-8NN

[Series 2: rtn chest with st · axial · 0.91mm/px · z∈[-328,-53]mm · 14 of 65 slices shown, 18 images]
[im 5/65  mediastinal]
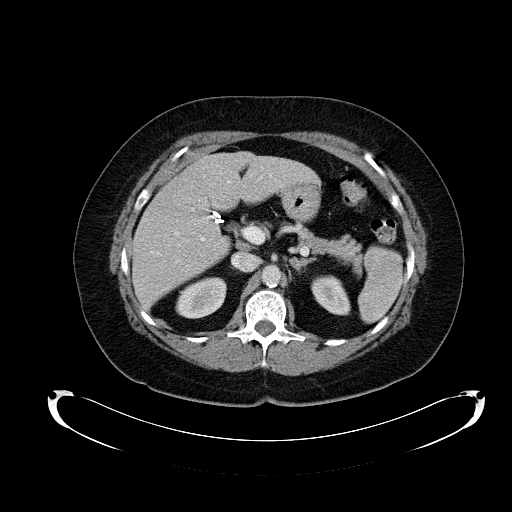
[im 5/65  lung]
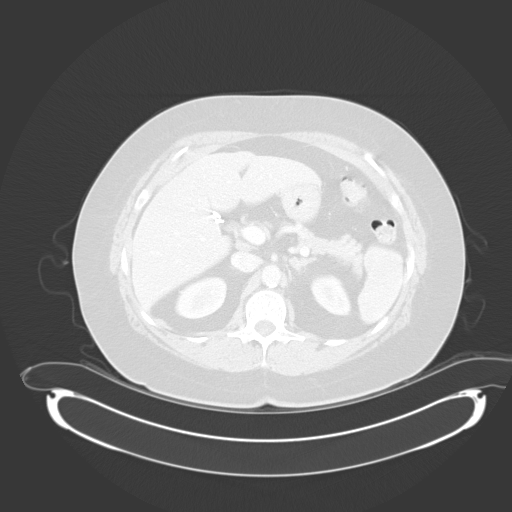
[im 10/65  lung]
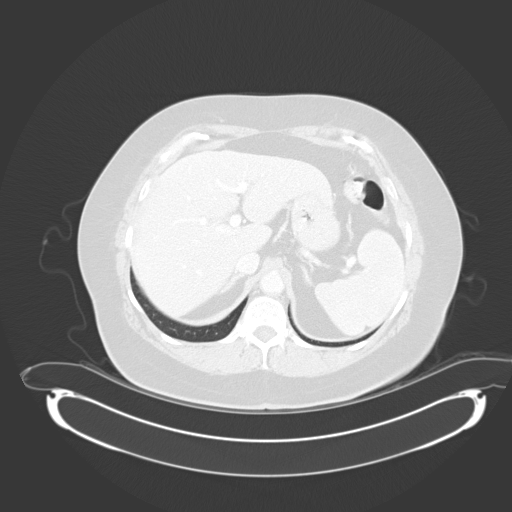
[im 14/65  lung]
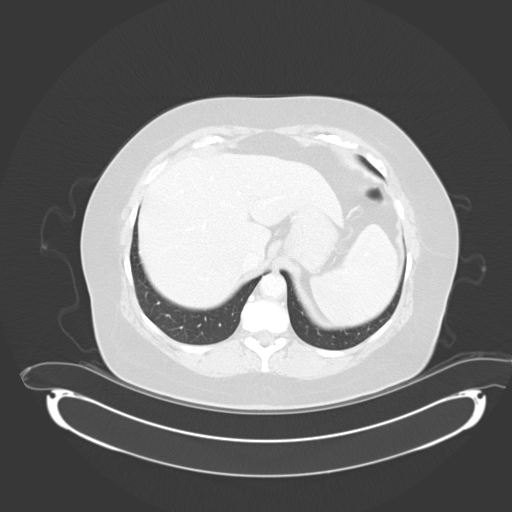
[im 19/65  lung]
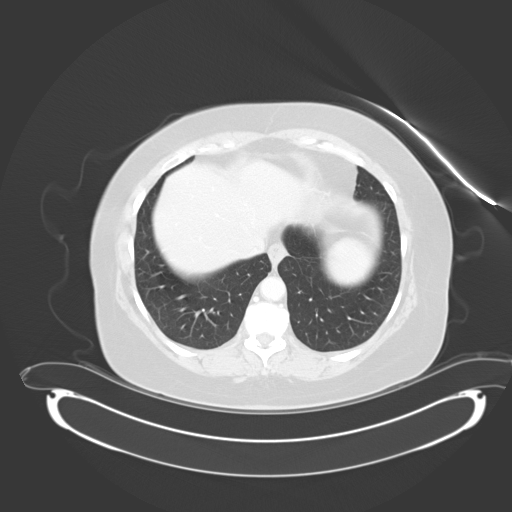
[im 23/65  mediastinal]
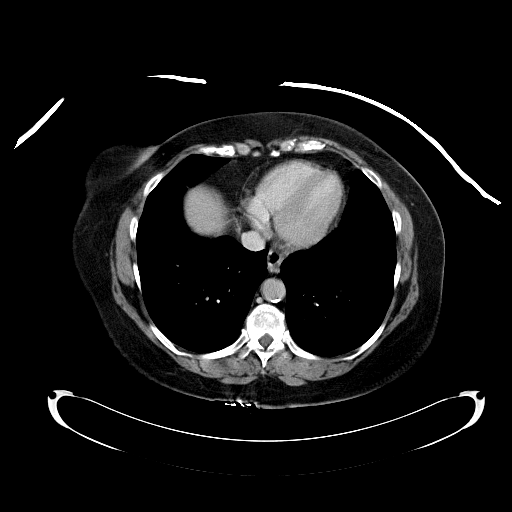
[im 23/65  lung]
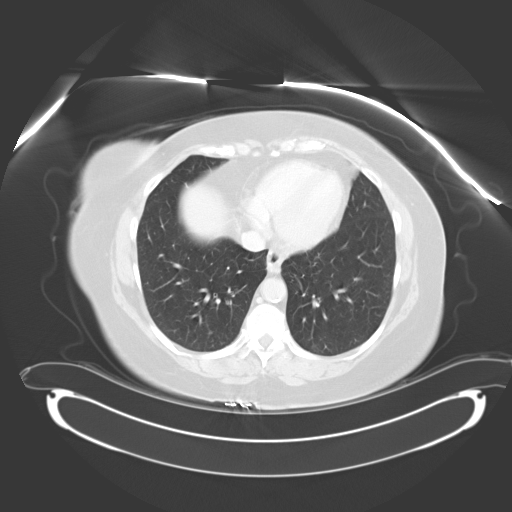
[im 28/65  lung]
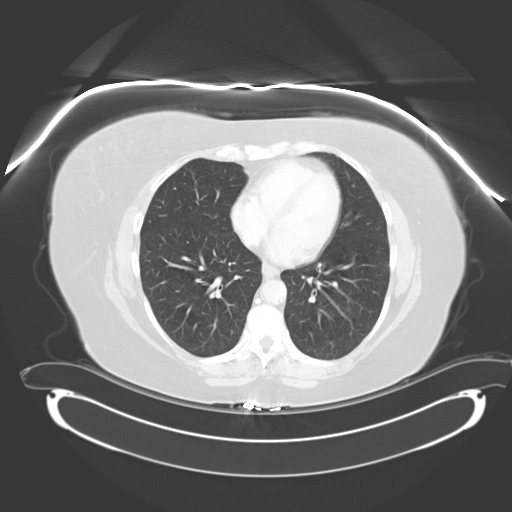
[im 31/65  lung]
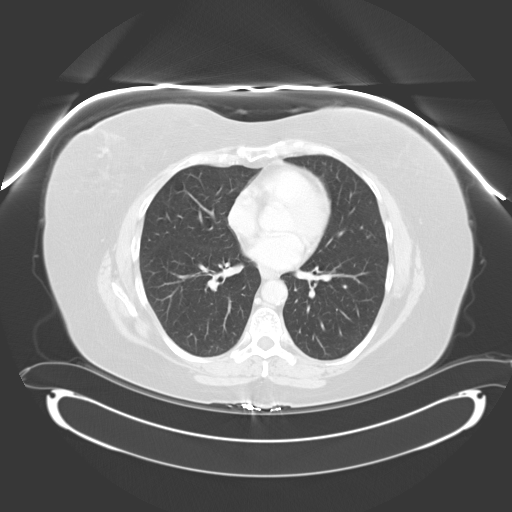
[im 33/65  lung]
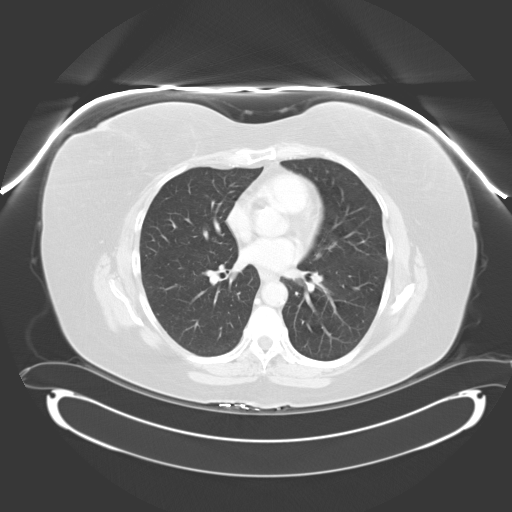
[im 37/65  mediastinal]
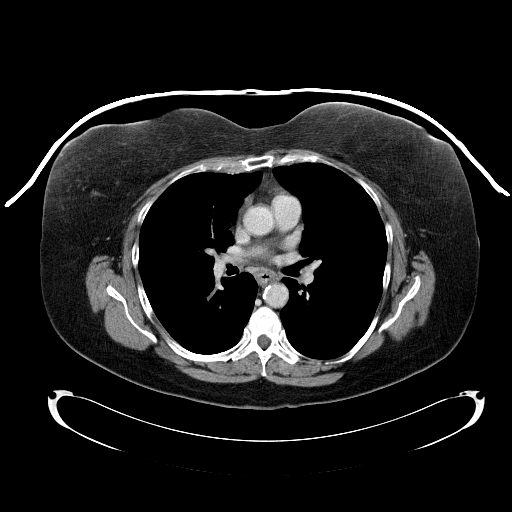
[im 37/65  lung]
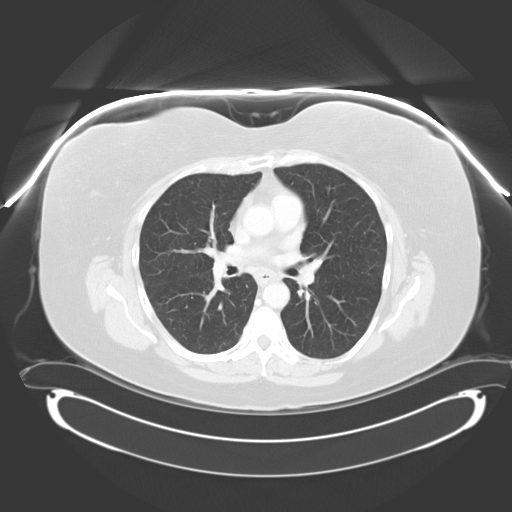
[im 42/65  lung]
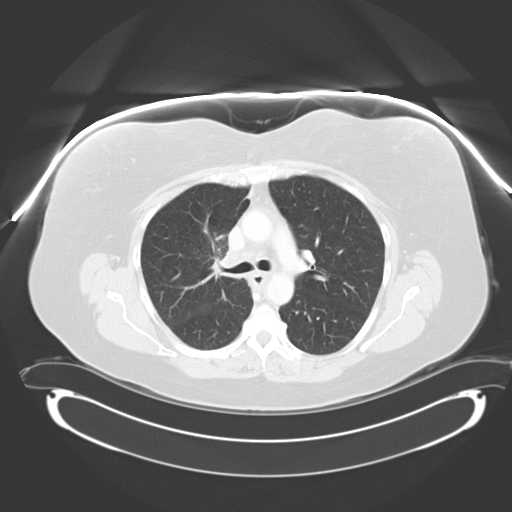
[im 46/65  lung]
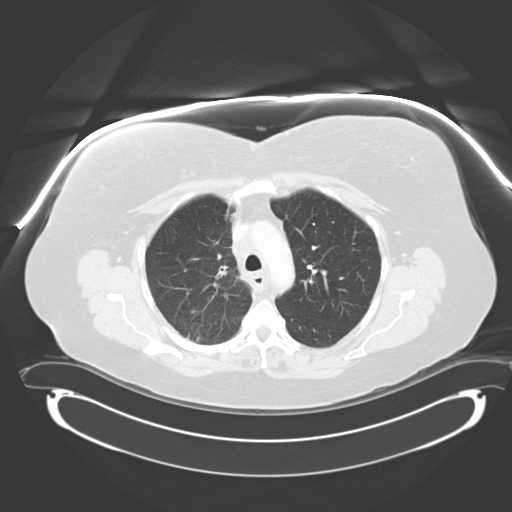
[im 51/65  lung]
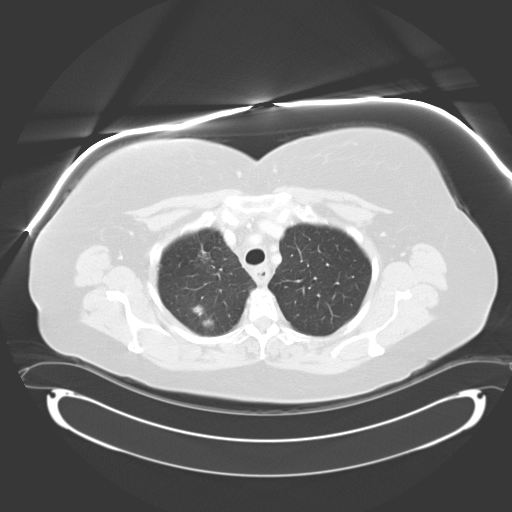
[im 55/65  mediastinal]
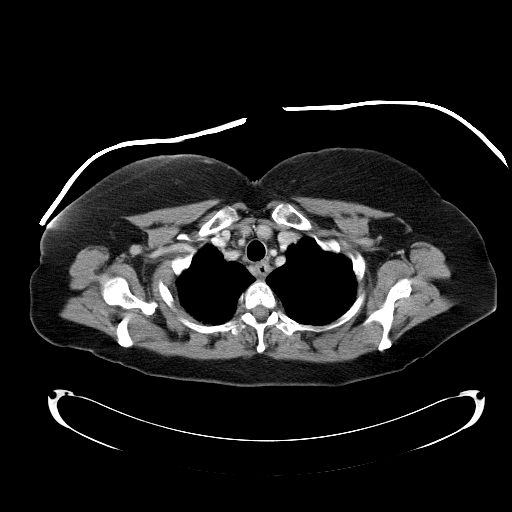
[im 55/65  lung]
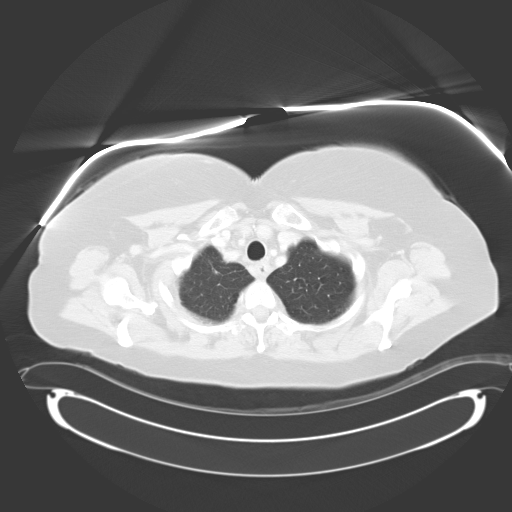
[im 60/65  lung]
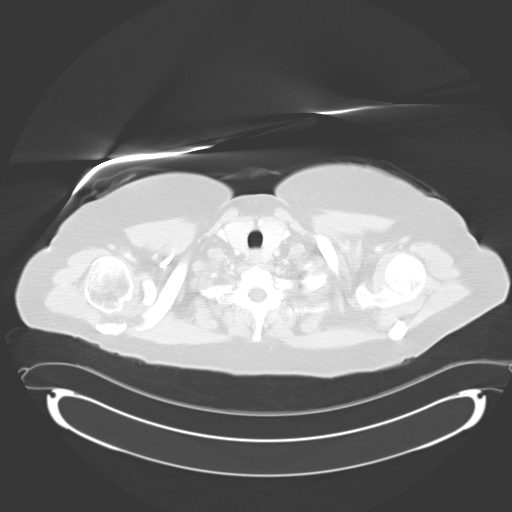

[14 of 30 positions shown; findings below may reference images not displayed]

FINDINGS: There has been near complete resolution of the large mass
involving the right superior mediastinum and right upper lobe since
prior study.  Residual soft tissue mass in the right paratracheal
region measures 1.9 x 2.3 cm compared with at least 5.8 x 3.9 cm on
prior exam, and no residual mass is seen in the central right upper
lobe.

No new or increased areas of adenopathy are seen within the thorax.
There is a new multi focal poorly defined areas of airspace opacity
in the right upper lobe since prior study, likely inflammatory
etiology.  This may be due to infection, radiation pneumonitis, or
drug reaction.  No discrete pulmonary nodules or masses are
identified. Mild esophageal wall thickening is also seen in the
upper mediastinum, consistent with mild radiation-induced
esophagitis.

There is no evidence of pleural or pericardial effusion.
Visualized upper abdominal structures including the adrenal glands
are unremarkable appearance.  No suspicious bone lesions are
identified.
IMPRESSION: 1.  Near complete resolution of mediastinal and right upper lobe
mass since prior study.
2.  No evidence of new or progressive neoplasm within the thorax.
3.  New multifocal airspace disease in right upper lobe, most
likely due to radiation pneumonitis, with other differential
considerations including infection or drug reaction.
4.  Mild upper thoracic esophagitis, likely secondary to radiation
therapy.

## 2010-01-22 ENCOUNTER — Ambulatory Visit: Payer: Self-pay | Admitting: Internal Medicine

## 2010-01-25 LAB — CONVERTED CEMR LAB
ALT: 16 units/L (ref 0–35)
BUN: 8 mg/dL (ref 6–23)
CO2: 30 meq/L (ref 19–32)
Chloride: 102 meq/L (ref 96–112)
Creatinine, Ser: 0.7 mg/dL (ref 0.4–1.2)
Potassium: 4 meq/L (ref 3.5–5.1)
Total CHOL/HDL Ratio: 6
Triglycerides: 181 mg/dL — ABNORMAL HIGH (ref 0.0–149.0)
VLDL: 36.2 mg/dL (ref 0.0–40.0)

## 2010-01-26 ENCOUNTER — Telehealth: Payer: Self-pay | Admitting: Internal Medicine

## 2010-02-18 IMAGING — CT CT CHEST W/ CM
2 of 3 series · 15 of 36 positions shown, 18 images · IV contrast (agent unspecified)
Comparison: 02/25/2009

CLINICAL DATA: Follow-up lung carcinoma.  Recently completed
chemotherapy and radiation therapy.

CT CHEST WITH CONTRAST
TECHNIQUE: Multidetector CT imaging of the chest was performed
following the standard protocol during bolus administration of
intravenous contrast.
Contrast: 80 ml Wmnipaque-XFF

[Series 2: chest with st · axial · 0.72mm/px · z∈[-321,-46]mm · 12 of 65 slices shown, 15 images]
[im 5/65  mediastinal]
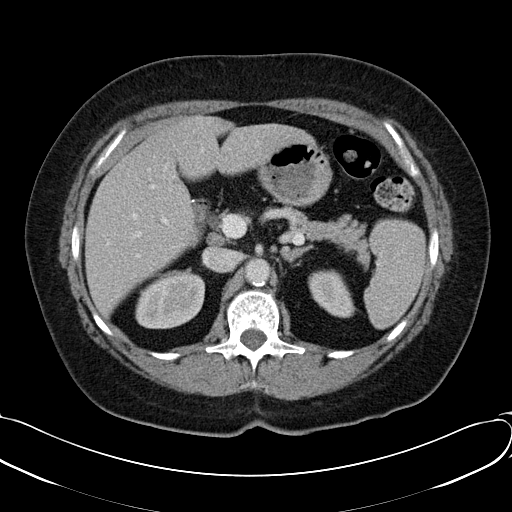
[im 5/65  lung]
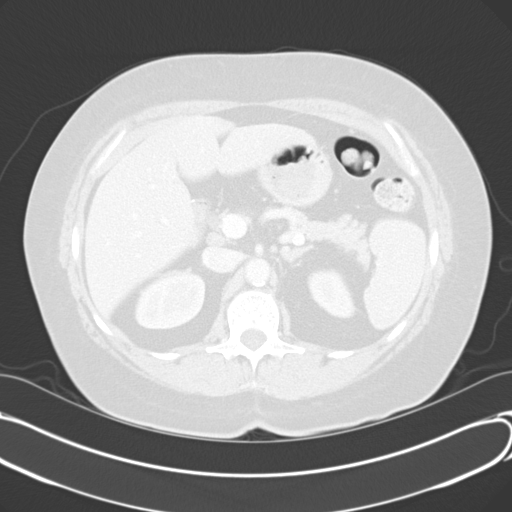
[im 10/65  lung]
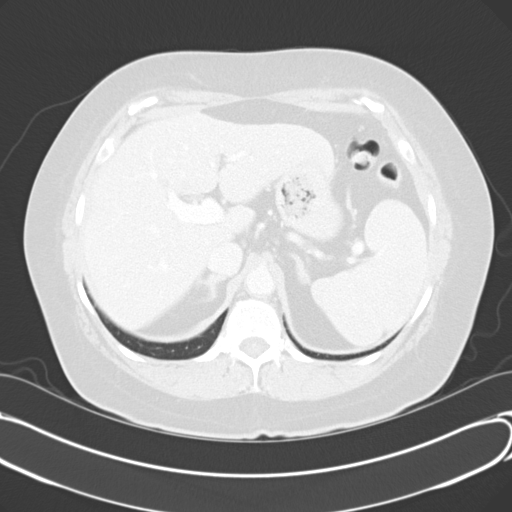
[im 15/65  lung]
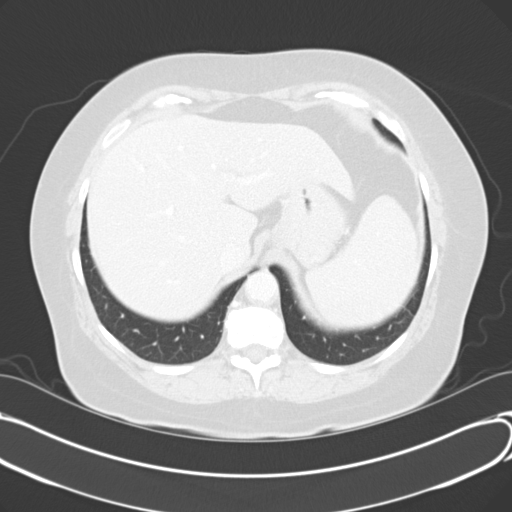
[im 19/65  lung]
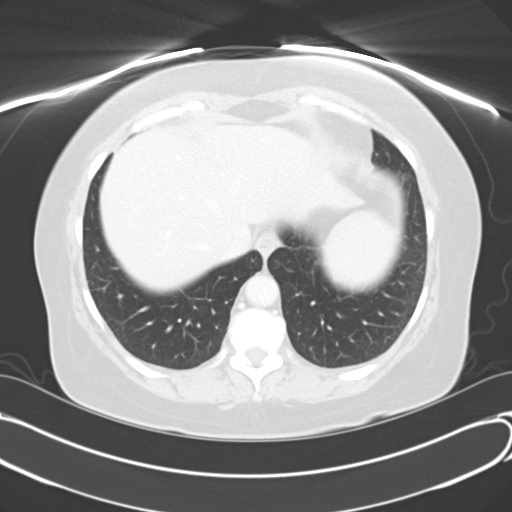
[im 24/65  mediastinal]
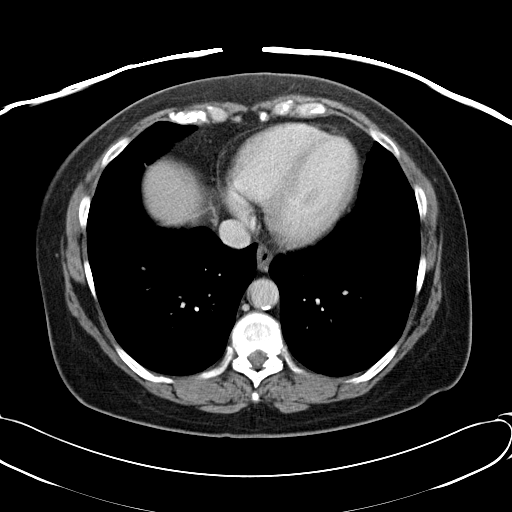
[im 24/65  lung]
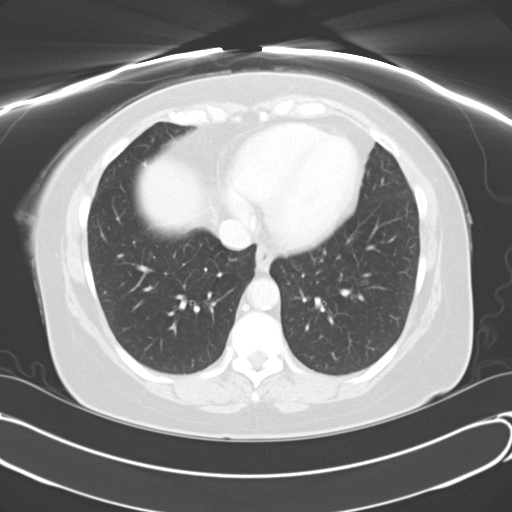
[im 29/65  lung]
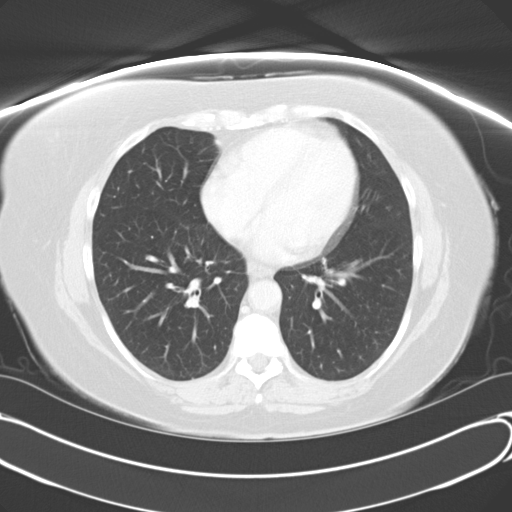
[im 36/65  lung]
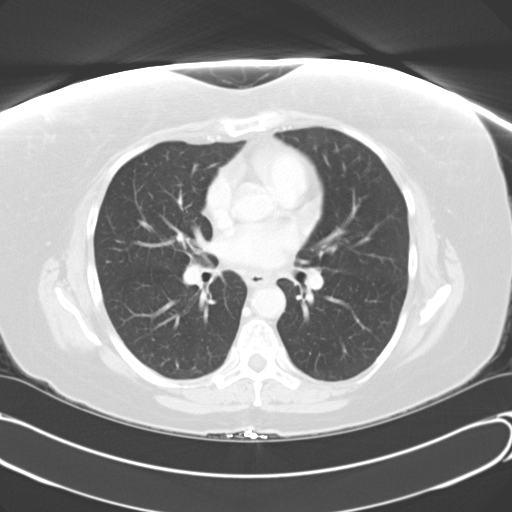
[im 41/65  lung]
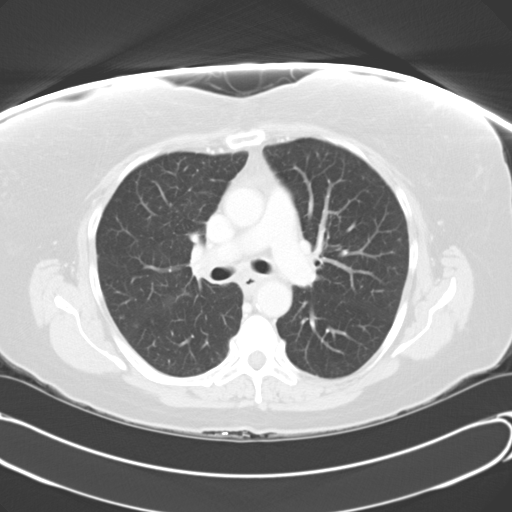
[im 46/65  mediastinal]
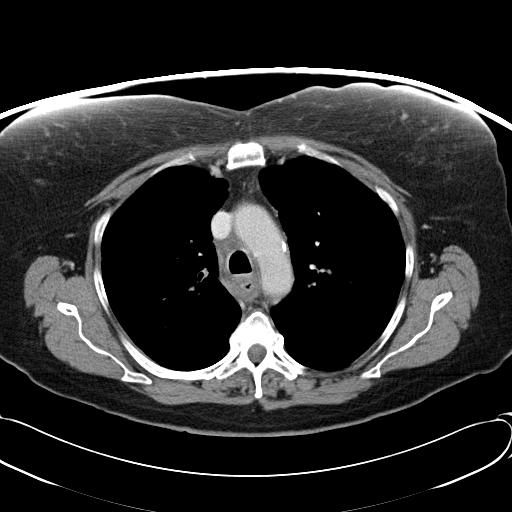
[im 46/65  lung]
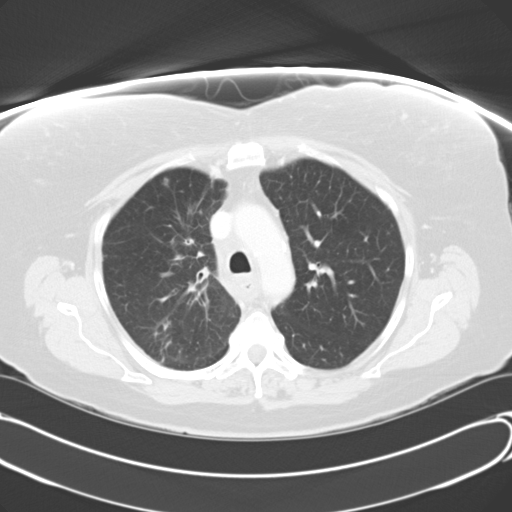
[im 50/65  lung]
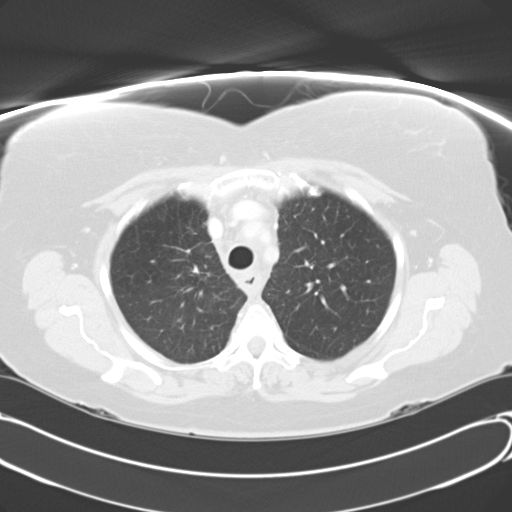
[im 55/65  lung]
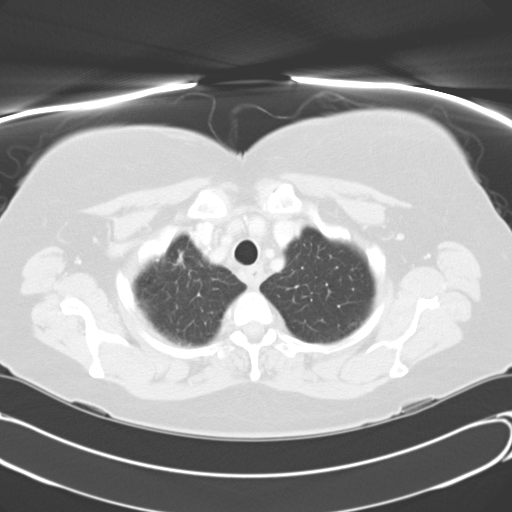
[im 60/65  lung]
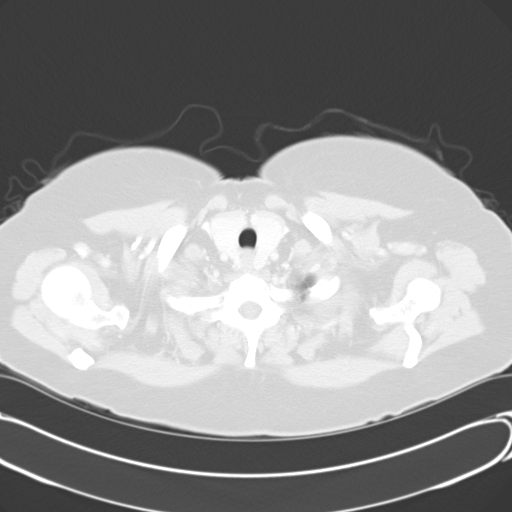

[Series 602: <mpr thick range> · coronal · 0.72mm/px · 3 of 74 slices shown]
[im 15/74  lung]
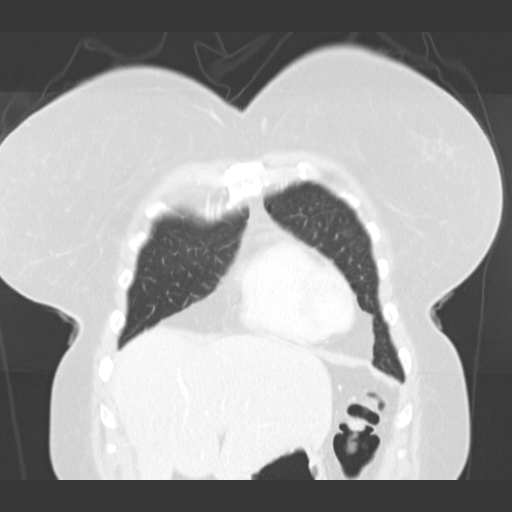
[im 30/74  lung]
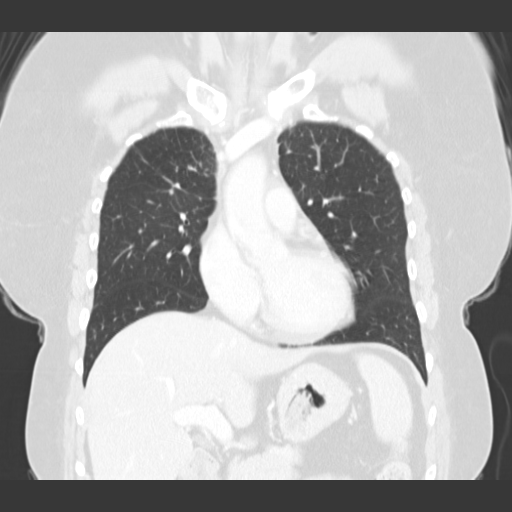
[im 44/74  lung]
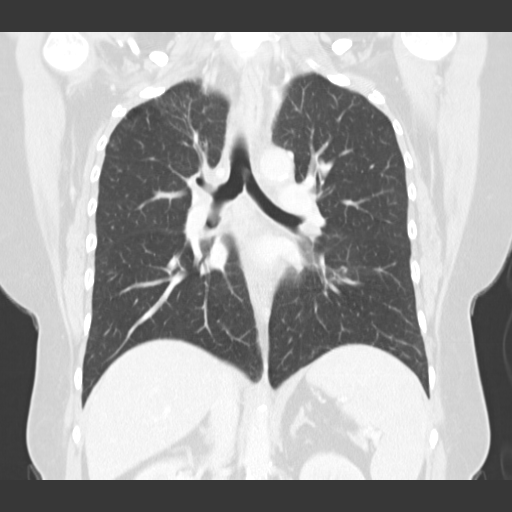

[15 of 36 positions shown; findings below may reference images not displayed]

FINDINGS: Further decrease mediastinal adenopathy is seen in the
right paratracheal region, now measuring 112 1 x 1.8 cm compared
with the 1.8 x 2.3 cm on prior study.  No new or increased
adenopathy is seen within the thorax.  Mild patchy airspace opacity
in the right upper lobe remains stable as likely due to radiation
pneumonitis.  No discrete pulmonary nodules or masses are
identified.  There is no evidence of pleural or pericardial
effusion. Mild wall thickening of the upper thoracic esophagus is
unchanged, also consistent with radiation therapy.

Both adrenal glands remain normal in appearance, as well as the
other visualized upper abdominal structures.  No suspicious bone
lesions are identified.
IMPRESSION: 1.  Further decrease in mild right paratracheal mediastinal
adenopathy since prior exam.
2.  Stable multifocal airspace opacity in the right upper lobe,
most likely due to radiation pneumonitis.
3.  No evidence of new or progressive disease within the thorax.

## 2010-03-10 ENCOUNTER — Ambulatory Visit (HOSPITAL_COMMUNITY): Admission: RE | Admit: 2010-03-10 | Discharge: 2010-03-10 | Payer: Self-pay | Admitting: Internal Medicine

## 2010-03-10 ENCOUNTER — Ambulatory Visit: Payer: Self-pay | Admitting: Internal Medicine

## 2010-03-10 LAB — CBC WITH DIFFERENTIAL/PLATELET
Basophils Absolute: 0 10*3/uL (ref 0.0–0.1)
Eosinophils Absolute: 0.1 10*3/uL (ref 0.0–0.5)
HGB: 13.8 g/dL (ref 11.6–15.9)
LYMPH%: 16 % (ref 14.0–49.7)
MCV: 93 fL (ref 79.5–101.0)
MONO#: 0.7 10*3/uL (ref 0.1–0.9)
MONO%: 10.3 % (ref 0.0–14.0)
NEUT#: 5.2 10*3/uL (ref 1.5–6.5)
Platelets: 195 10*3/uL (ref 145–400)
RBC: 4.31 10*6/uL (ref 3.70–5.45)
RDW: 15 % — ABNORMAL HIGH (ref 11.2–14.5)
WBC: 7.2 10*3/uL (ref 3.9–10.3)

## 2010-03-10 LAB — COMPREHENSIVE METABOLIC PANEL
Albumin: 3.9 g/dL (ref 3.5–5.2)
BUN: 9 mg/dL (ref 6–23)
CO2: 27 mEq/L (ref 19–32)
Glucose, Bld: 114 mg/dL — ABNORMAL HIGH (ref 70–99)
Potassium: 4.1 mEq/L (ref 3.5–5.3)
Sodium: 139 mEq/L (ref 135–145)
Total Protein: 7.4 g/dL (ref 6.0–8.3)

## 2010-03-15 ENCOUNTER — Encounter: Payer: Self-pay | Admitting: Internal Medicine

## 2010-03-24 LAB — HM DIABETES EYE EXAM: HM Diabetic Eye Exam: NORMAL

## 2010-03-30 ENCOUNTER — Emergency Department (HOSPITAL_COMMUNITY): Admission: EM | Admit: 2010-03-30 | Discharge: 2010-03-31 | Payer: Self-pay | Admitting: Emergency Medicine

## 2010-04-09 ENCOUNTER — Ambulatory Visit: Payer: Self-pay | Admitting: Internal Medicine

## 2010-04-14 ENCOUNTER — Telehealth: Payer: Self-pay | Admitting: Internal Medicine

## 2010-04-18 IMAGING — CT CT CHEST W/ CM
2 of 4 series · 15 of 36 positions shown, 18 images · IV contrast (agent unspecified)
Comparison: 04/17/2009

CLINICAL DATA: Follow-up lung carcinoma.  Recently completed
chemotherapy and radiation therapy.

CT CHEST WITH CONTRAST
TECHNIQUE: Multidetector CT imaging of the chest was performed
following the standard protocol during bolus administration of
intravenous contrast.
Contrast: 80 ml Zmnipaque-2CC

[Series 2: chest with st · axial · 0.70mm/px · z∈[-303,-48]mm · 12 of 61 slices shown, 15 images]
[im 5/61  mediastinal]
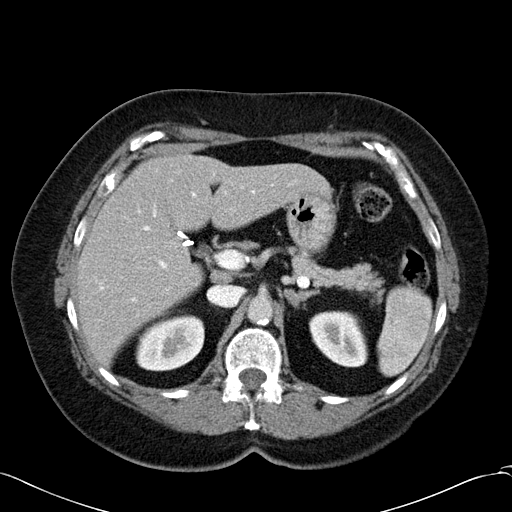
[im 5/61  lung]
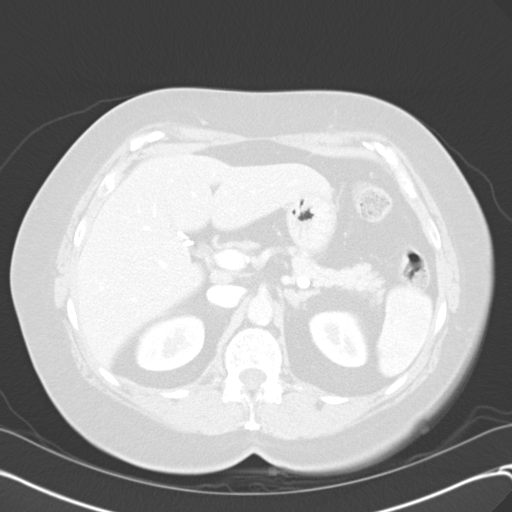
[im 9/61  lung]
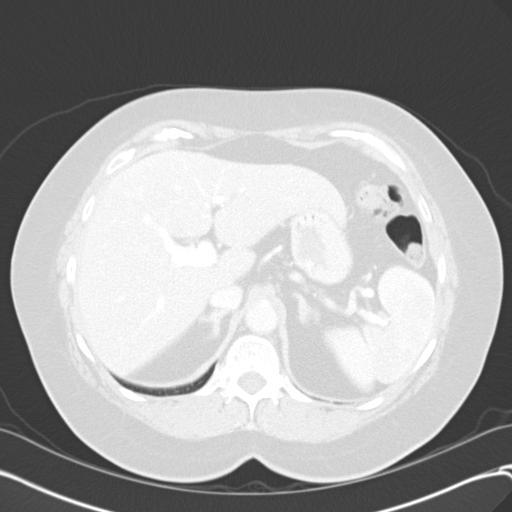
[im 13/61  lung]
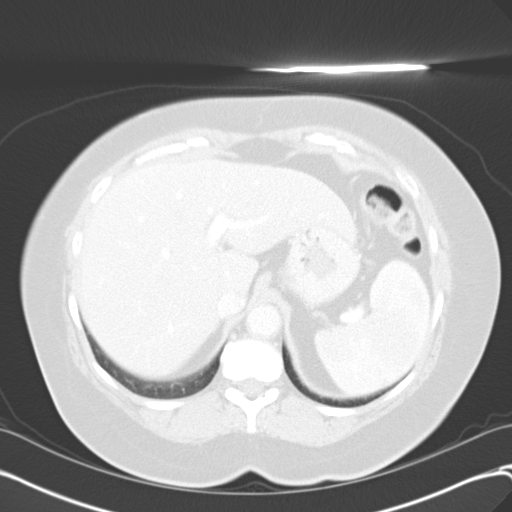
[im 18/61  lung]
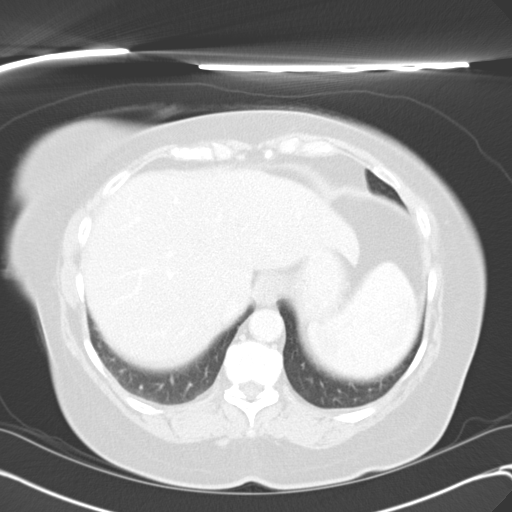
[im 22/61  mediastinal]
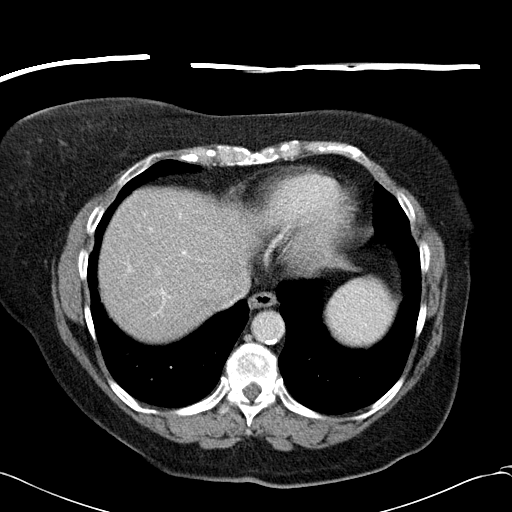
[im 22/61  lung]
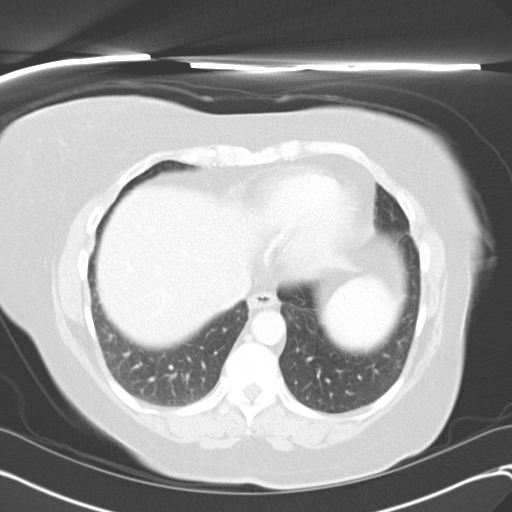
[im 26/61  lung]
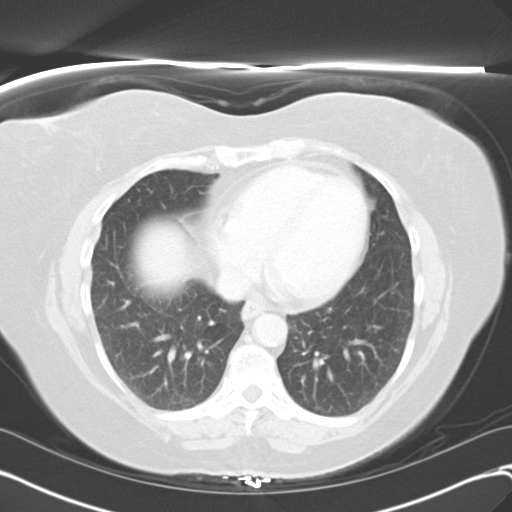
[im 35/61  lung]
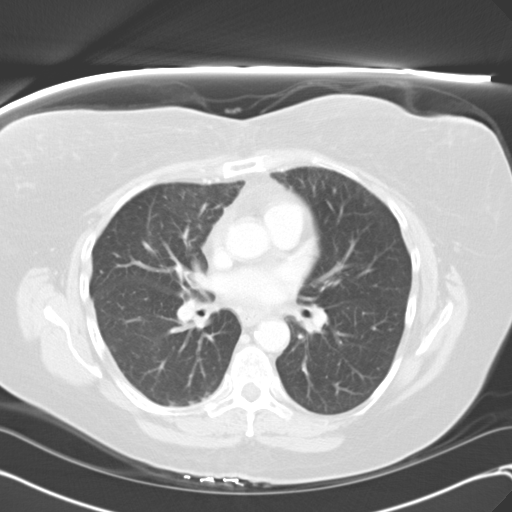
[im 39/61  lung]
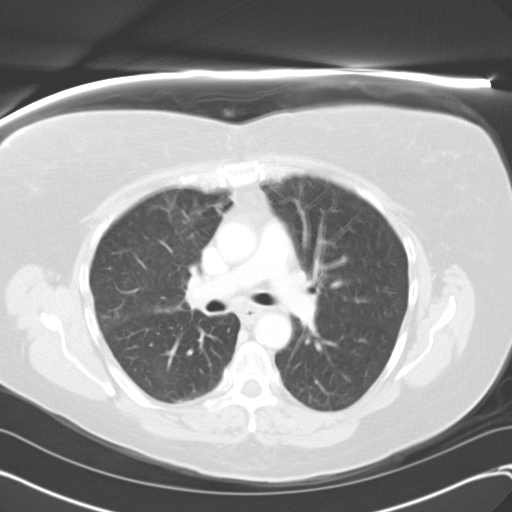
[im 43/61  mediastinal]
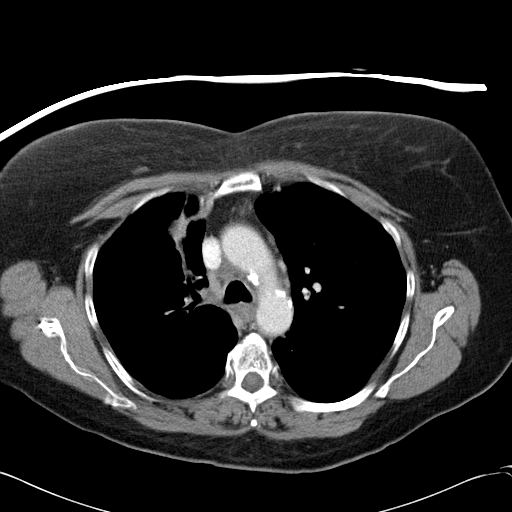
[im 43/61  lung]
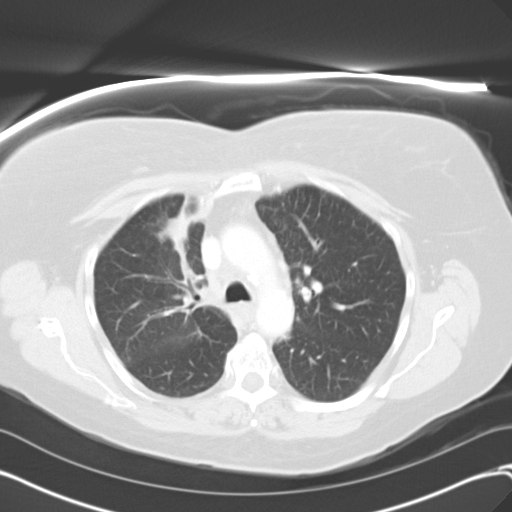
[im 48/61  lung]
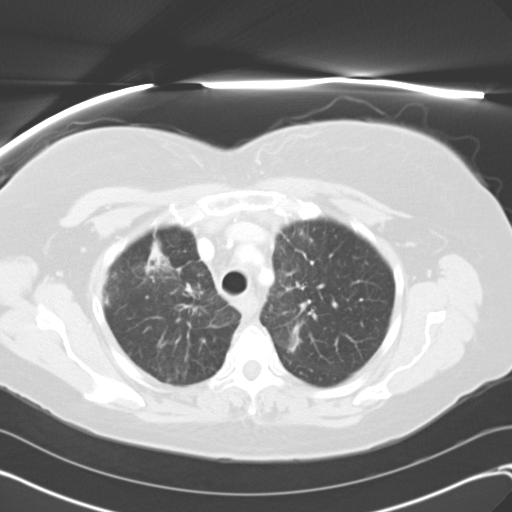
[im 52/61  lung]
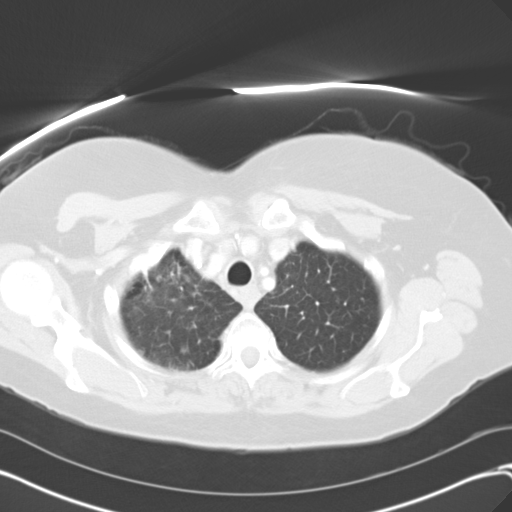
[im 56/61  lung]
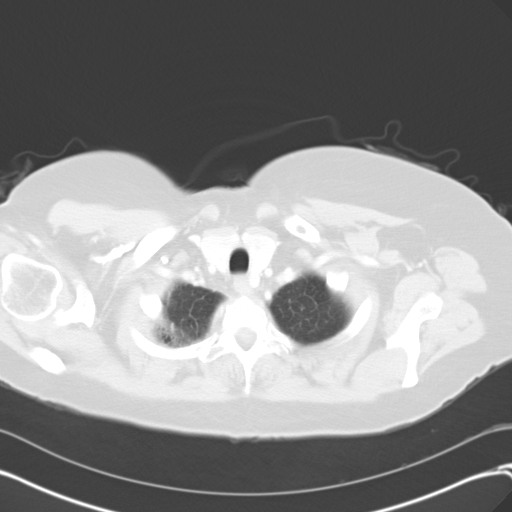

[Series 602: <mpr thick range> · coronal · 0.70mm/px · 3 of 74 slices shown]
[im 15/74  lung]
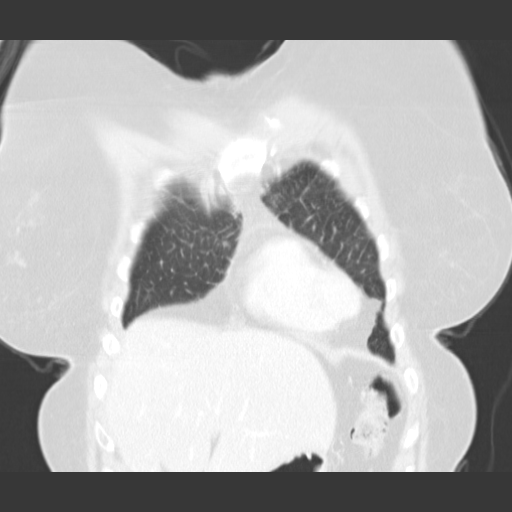
[im 30/74  lung]
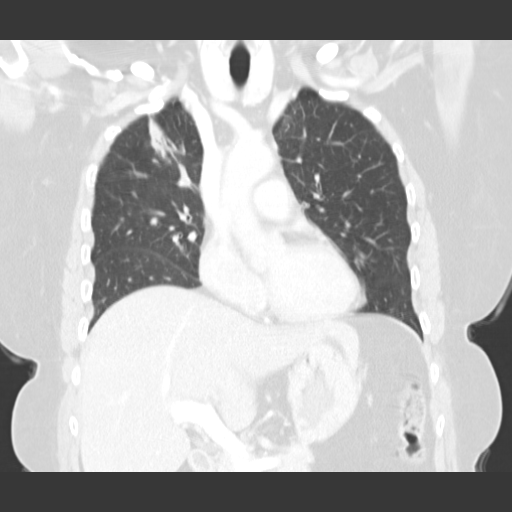
[im 44/74  lung]
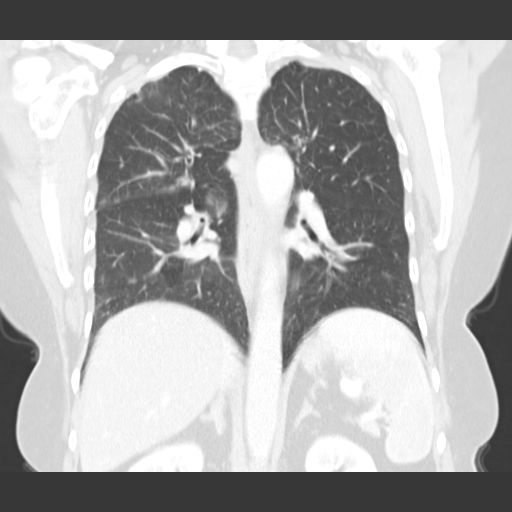

[15 of 36 positions shown; findings below may reference images not displayed]

FINDINGS: Increased multifocal patchy airspace opacity is seen in
the right upper lobe and the paramediastinal region of the left
upper lobe.  This distribution appearance is most consistent with
radiation pneumonitis.

Mediastinal soft tissue density in the right paratracheal region
measures approximately 12 x 17 mm and is stable.  No new or
increased adenopathy is seen.  There is no evidence of pleural or
pericardial effusion.

Visualized upper abdominal structures including the adrenal glands
are normal in appearance.  No suspicious bone lesions identified.
IMPRESSION: 1.  Worsening multifocal patchy airspace opacity in both upper
lobes, most likely due to radiation pneumonitis.
2.  Stable mild right paratracheal mediastinal adenopathy.

## 2010-04-27 ENCOUNTER — Ambulatory Visit: Payer: Self-pay | Admitting: Internal Medicine

## 2010-04-30 LAB — CONVERTED CEMR LAB
ALT: 18 units/L (ref 0–35)
AST: 19 units/L (ref 0–37)
Alkaline Phosphatase: 106 units/L (ref 39–117)
CO2: 31 meq/L (ref 19–32)
Calcium: 9.3 mg/dL (ref 8.4–10.5)
Cholesterol: 226 mg/dL — ABNORMAL HIGH (ref 0–200)
Creatinine, Ser: 0.7 mg/dL (ref 0.4–1.2)
GFR calc non Af Amer: 98.21 mL/min (ref >60)
Glucose, Bld: 138 mg/dL — ABNORMAL HIGH (ref 70–99)
Sodium: 143 meq/L (ref 135–145)
Total Bilirubin: 0.5 mg/dL (ref 0.3–1.2)
Triglycerides: 248 mg/dL — ABNORMAL HIGH (ref 0.0–149.0)

## 2010-07-01 ENCOUNTER — Ambulatory Visit: Payer: Self-pay | Admitting: Internal Medicine

## 2010-07-05 ENCOUNTER — Ambulatory Visit: Payer: Self-pay | Admitting: Internal Medicine

## 2010-07-05 LAB — CONVERTED CEMR LAB: Creatinine, Ser: 0.7 mg/dL (ref 0.4–1.2)

## 2010-07-06 ENCOUNTER — Ambulatory Visit: Payer: Self-pay | Admitting: Cardiovascular Disease

## 2010-07-12 ENCOUNTER — Encounter: Payer: Self-pay | Admitting: Internal Medicine

## 2010-07-12 ENCOUNTER — Ambulatory Visit: Payer: Self-pay | Admitting: Internal Medicine

## 2010-07-14 ENCOUNTER — Ambulatory Visit (HOSPITAL_COMMUNITY): Admission: RE | Admit: 2010-07-14 | Discharge: 2010-07-14 | Payer: Self-pay | Admitting: Internal Medicine

## 2010-07-14 IMAGING — CT CT CHEST W/ CM
2 of 4 series · 15 of 36 positions shown, 18 images · IV contrast (agent unspecified)
Comparison: Prior CTs 04/17/2009 and 06/15/2009.

CLINICAL DATA: Follow-up lung cancer status post completion of
chemotherapy and radiation therapy.

CT CHEST WITH CONTRAST
TECHNIQUE: Multidetector CT imaging of the chest was performed
following the standard protocol during bolus administration of
intravenous contrast.
Contrast: 80 ml 7mnipaque-YEE intravenously.

[Series 2: chest with st · axial · 0.87mm/px · z∈[-295,-40]mm · 12 of 61 slices shown, 15 images]
[im 5/61  mediastinal]
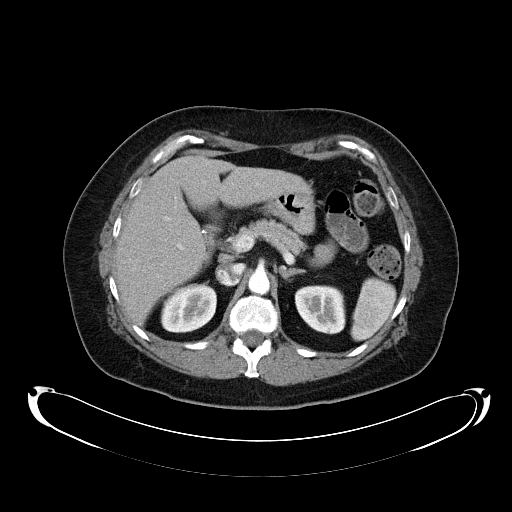
[im 5/61  lung]
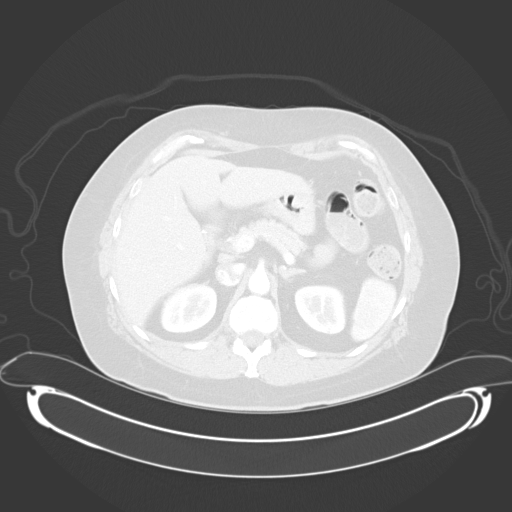
[im 9/61  lung]
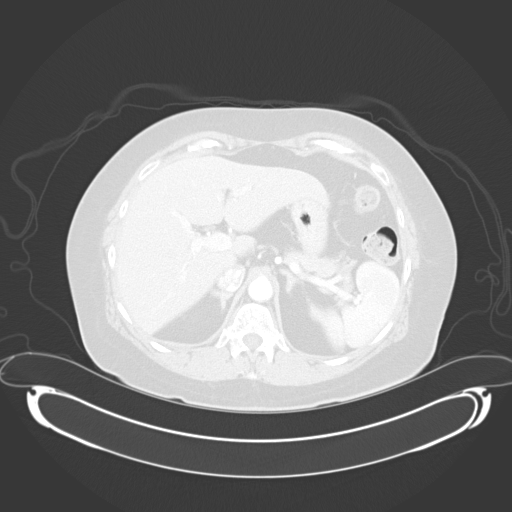
[im 13/61  lung]
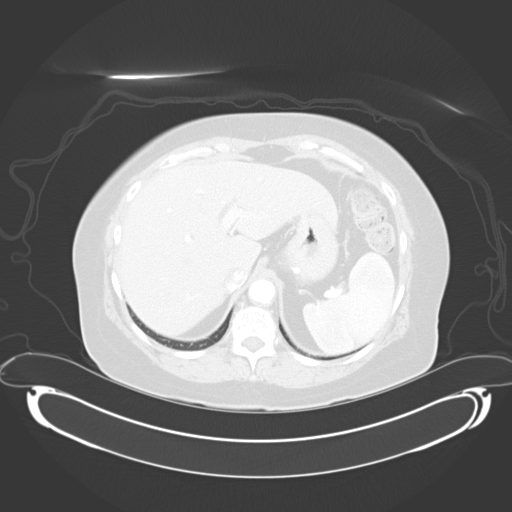
[im 18/61  lung]
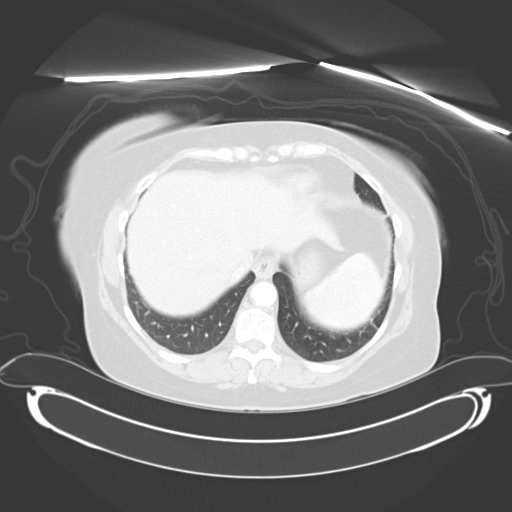
[im 22/61  mediastinal]
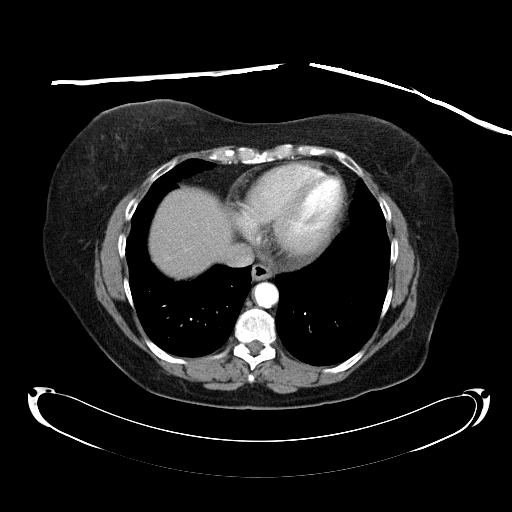
[im 22/61  lung]
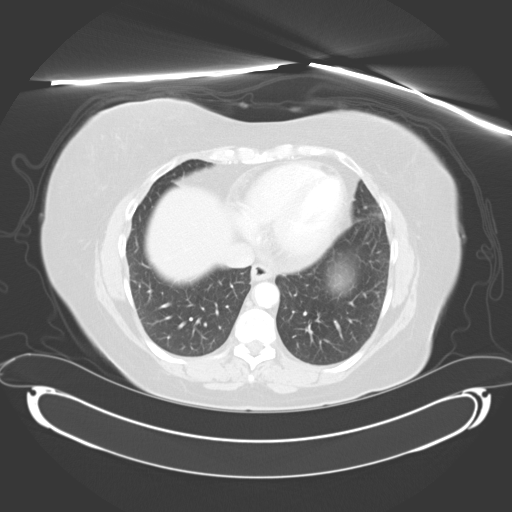
[im 26/61  lung]
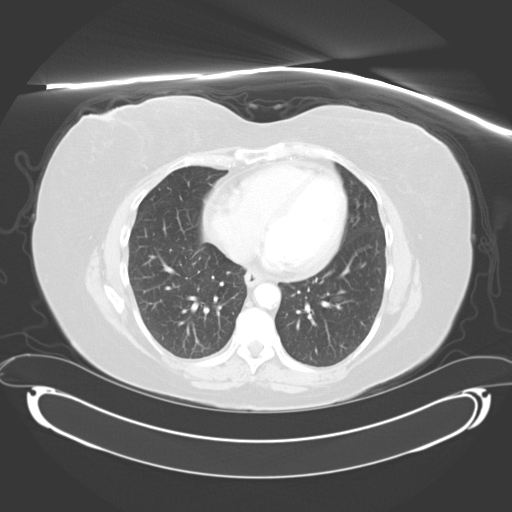
[im 35/61  lung]
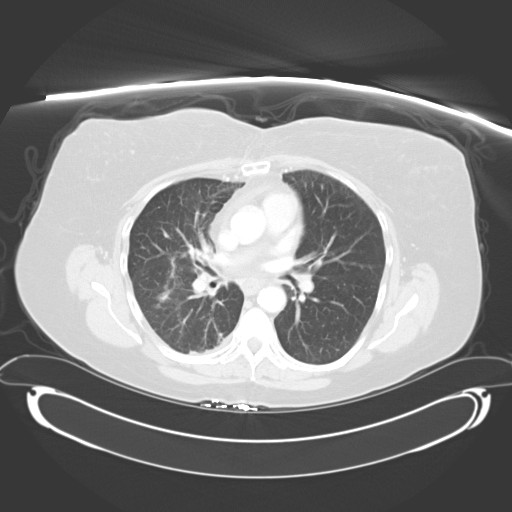
[im 39/61  lung]
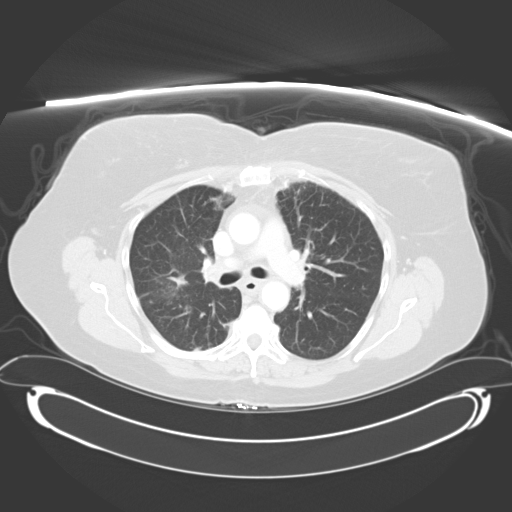
[im 43/61  mediastinal]
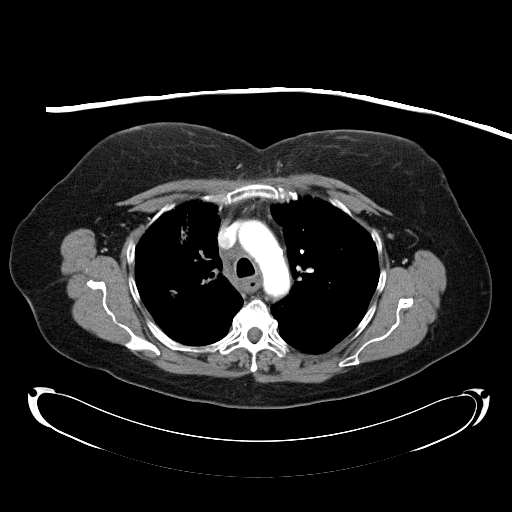
[im 43/61  lung]
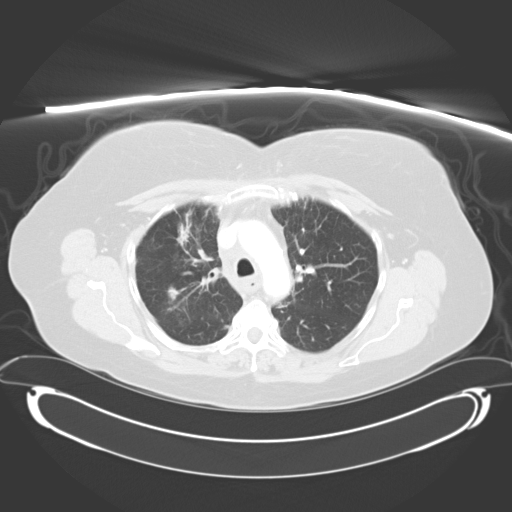
[im 48/61  lung]
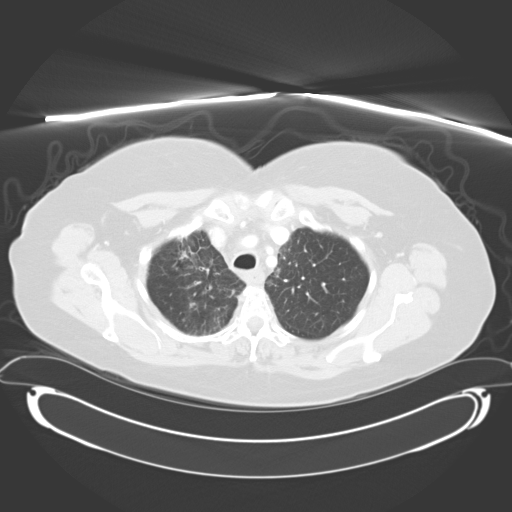
[im 52/61  lung]
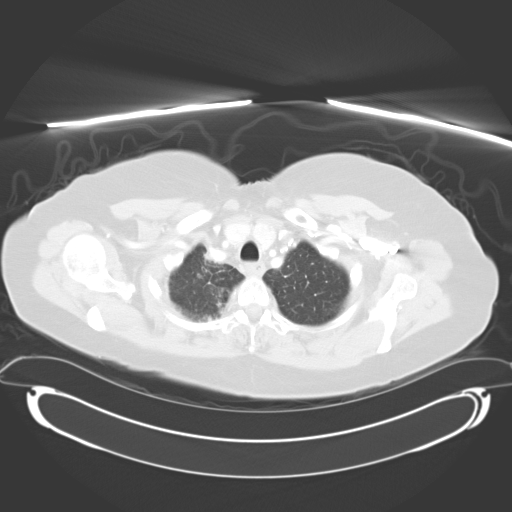
[im 56/61  lung]
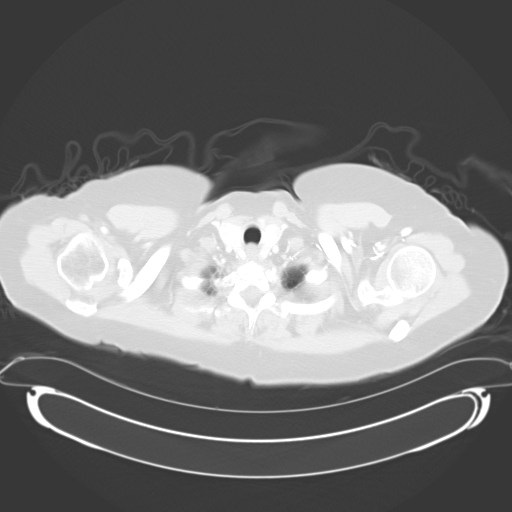

[Series 602: <mpr thick range> · coronal · 0.87mm/px · 3 of 86 slices shown]
[im 18/86  lung]
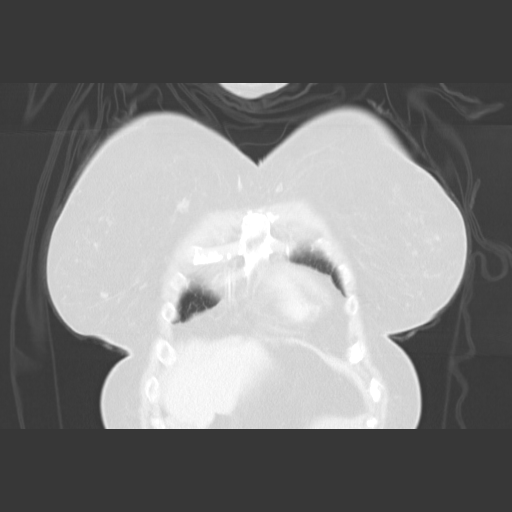
[im 35/86  lung]
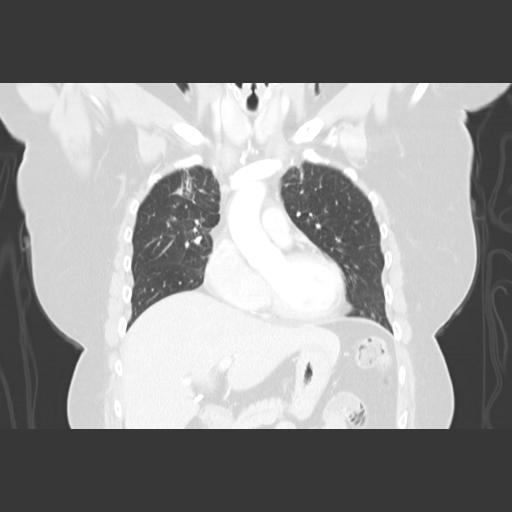
[im 52/86  lung]
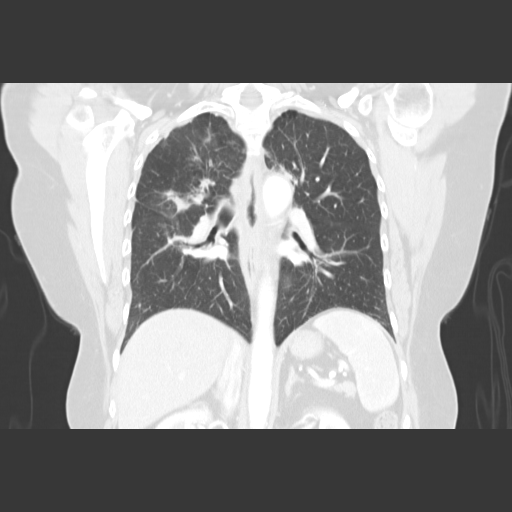

[15 of 36 positions shown; findings below may reference images not displayed]

FINDINGS: Soft tissue stranding in the mediastinal fat is similar
to the prior study and probably related to prior radiation.  There
is associated thickening of the walls of the mid thoracic
esophagus.  9 mm right paratracheal soft tissue on image 19 is
unchanged.  There is no discrete adenopathy.  There is no
endobronchial lesion.

A trace amount of pericardial fluid is present.  There is no
pleural effusion.

Patchy parenchymal opacities throughout the right upper lobe are
similar to the prior study and most compatible with radiation
pneumonitis.  There is a component peripherally on images 19
through 22 which has slightly increased.  There is also slightly
more subpleural density in the superior segment of the right lower
lobe without discrete mass.  A more anterior component in the upper
lobe has decreased.  There is no dominant mass.  Minimal
involvement medially in the left upper lobe is unchanged.

Mild enlargement and heterogeneity of the thyroid gland are
unchanged.  The imaged upper abdomen has a stable appearance status
post cholecystectomy.  There is no adrenal mass or suspicious
osseous lesion.
IMPRESSION: 1. Fluctuating ill-defined opacities in the right upper lobe most
compatible with radiation pneumonitis.  No focal mass or
endobronchial lesions identified.
2.  No discrete adenopathy.  Radiation changes are present within
mediastinal fat and thoracic esophagus.
3.  No distant metastases.

## 2010-07-19 ENCOUNTER — Telehealth: Payer: Self-pay | Admitting: Internal Medicine

## 2010-07-26 ENCOUNTER — Ambulatory Visit (HOSPITAL_COMMUNITY)
Admission: RE | Admit: 2010-07-26 | Discharge: 2010-07-26 | Payer: Self-pay | Source: Home / Self Care | Admitting: Internal Medicine

## 2010-07-28 ENCOUNTER — Encounter: Payer: Self-pay | Admitting: Internal Medicine

## 2010-08-25 ENCOUNTER — Ambulatory Visit: Payer: Self-pay | Admitting: Internal Medicine

## 2010-08-27 LAB — CONVERTED CEMR LAB
AST: 17 units/L (ref 0–37)
Albumin: 3.7 g/dL (ref 3.5–5.2)
Cholesterol: 178 mg/dL (ref 0–200)
Direct LDL: 108.2 mg/dL
HDL: 28.9 mg/dL — ABNORMAL LOW (ref >39.00)
TSH: 0.35 microintl units/mL (ref 0.35–5.50)
Total Bilirubin: 0.6 mg/dL (ref 0.3–1.2)
Total CHOL/HDL Ratio: 6
Triglycerides: 345 mg/dL — ABNORMAL HIGH (ref 0.0–149.0)
VLDL: 69 mg/dL — ABNORMAL HIGH (ref 0.0–40.0)

## 2010-10-04 ENCOUNTER — Ambulatory Visit: Payer: Self-pay | Admitting: Internal Medicine

## 2010-10-07 ENCOUNTER — Telehealth: Payer: Self-pay | Admitting: Internal Medicine

## 2010-10-13 IMAGING — CT CT CHEST W/ CM
2 of 4 series · 15 of 36 positions shown, 18 images · IV contrast (omnipaque)
Comparison: CT thorax 09/10/2009

CLINICAL DATA: Lung cancer status post chemotherapy and radiation
therapy initial diagnosis December 2008

CT CHEST WITH CONTRAST
TECHNIQUE: Multidetector CT imaging of the chest was performed
following the standard protocol during bolus administration of
intravenous contrast.
Contrast: 80 ml Omnipaque 300

[Series 2: chest with st · axial · 0.74mm/px · z∈[-331,-66]mm · 12 of 63 slices shown, 15 images]
[im 5/63  mediastinal]
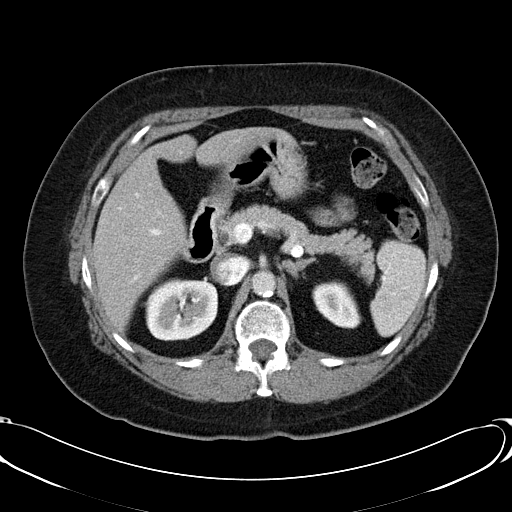
[im 5/63  lung]
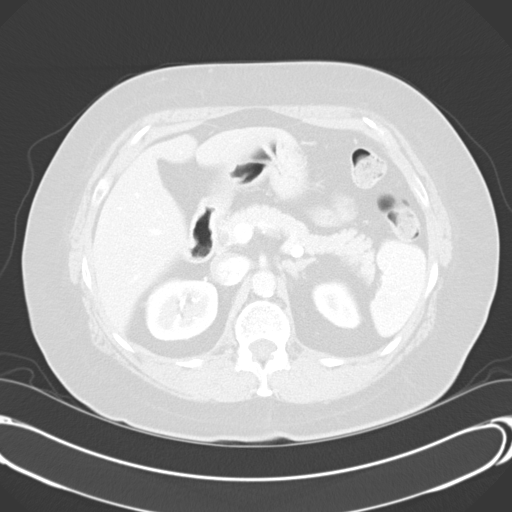
[im 9/63  lung]
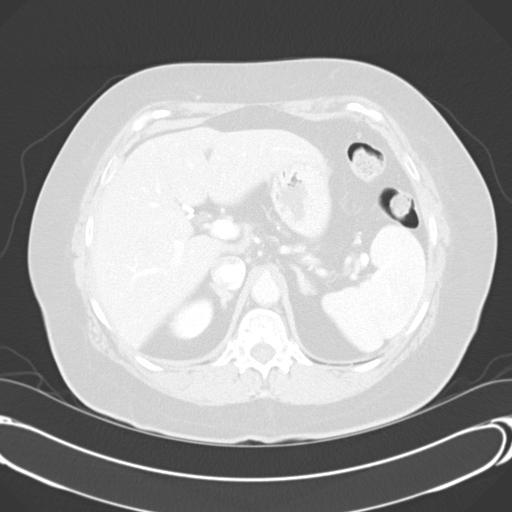
[im 14/63  lung]
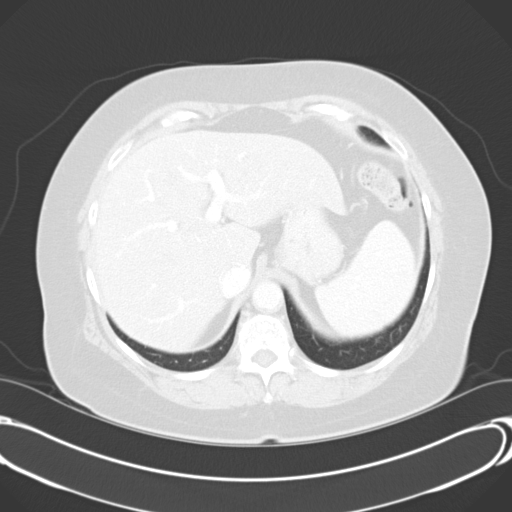
[im 18/63  lung]
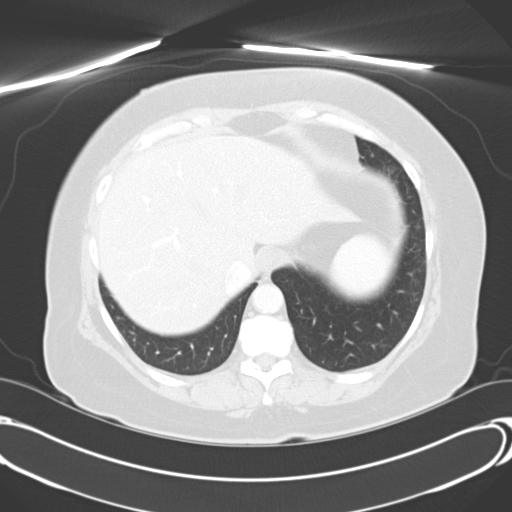
[im 23/63  mediastinal]
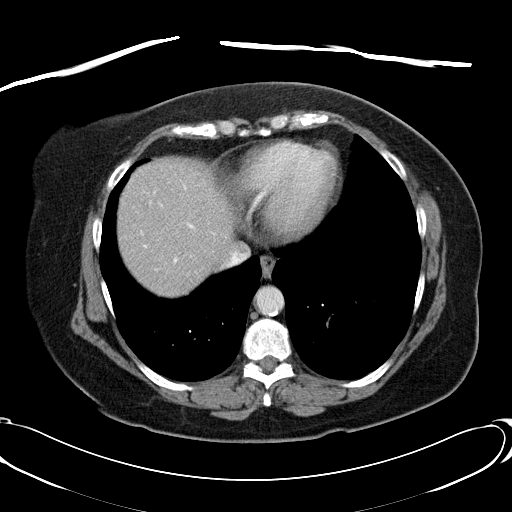
[im 23/63  lung]
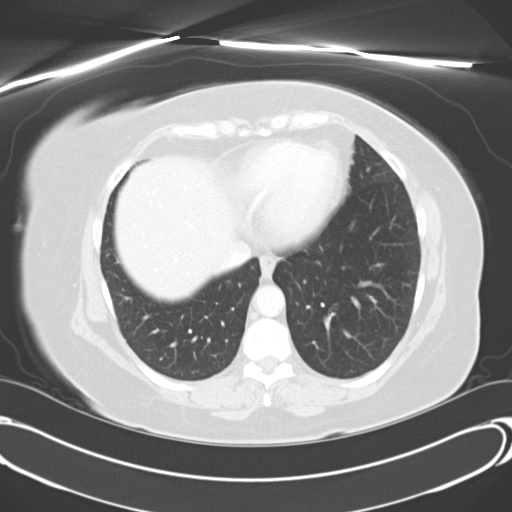
[im 27/63  lung]
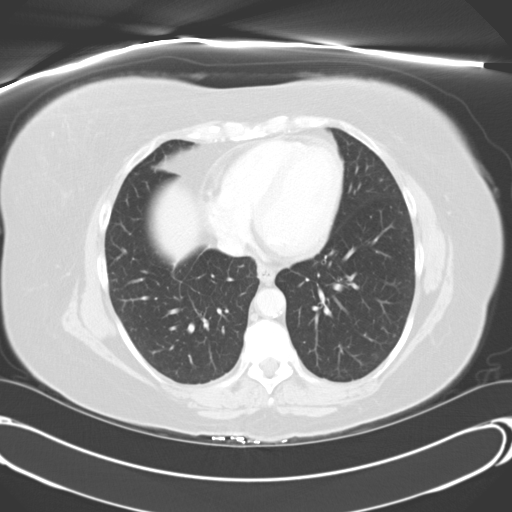
[im 36/63  lung]
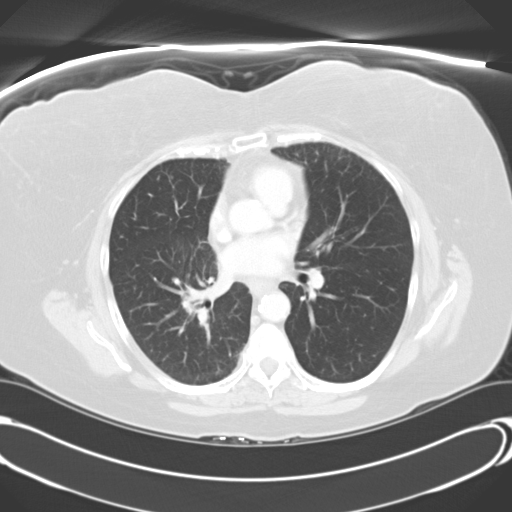
[im 40/63  lung]
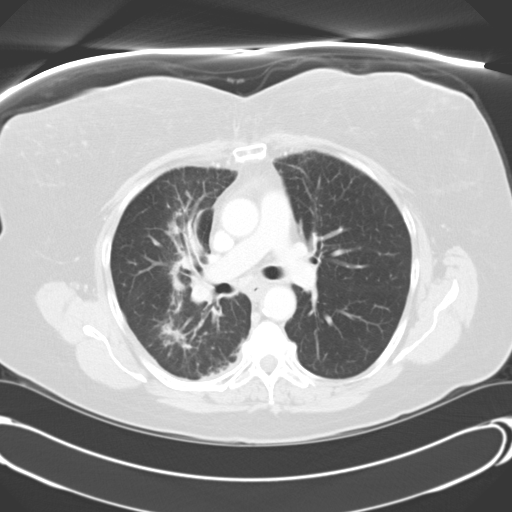
[im 45/63  mediastinal]
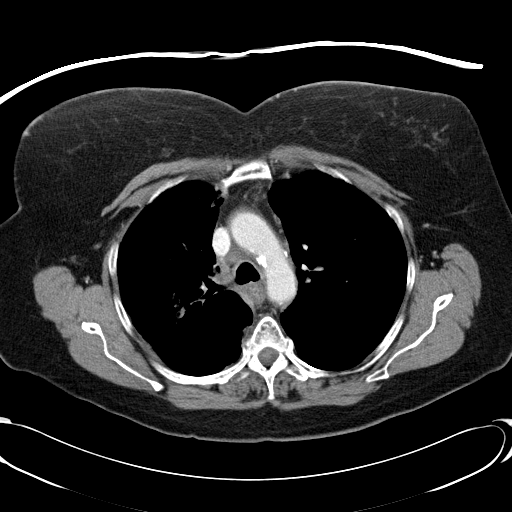
[im 45/63  lung]
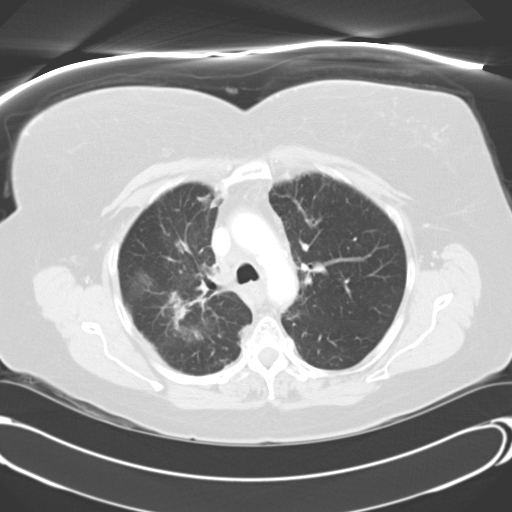
[im 49/63  lung]
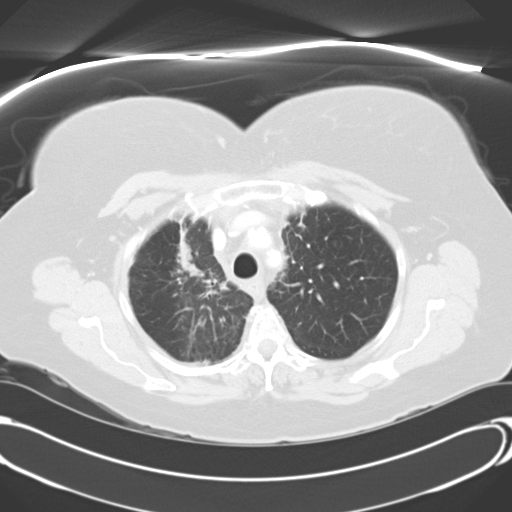
[im 54/63  lung]
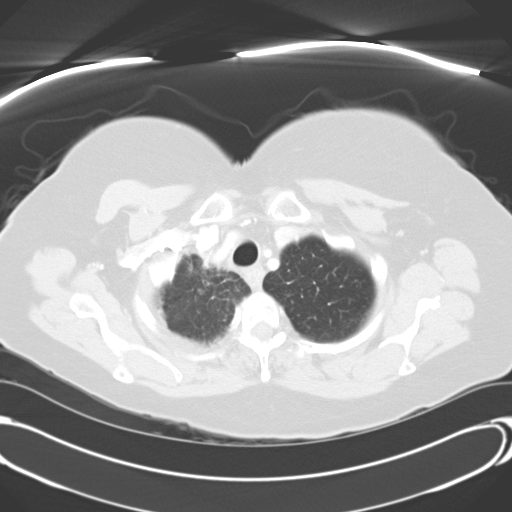
[im 58/63  lung]
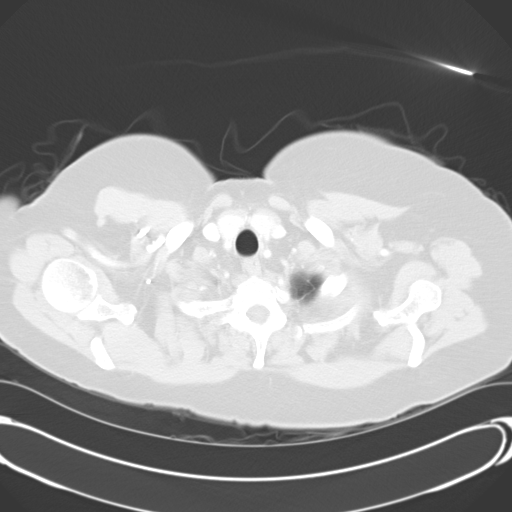

[Series 602: <mpr thick range> · coronal · 0.74mm/px · 3 of 84 slices shown]
[im 17/84  lung]
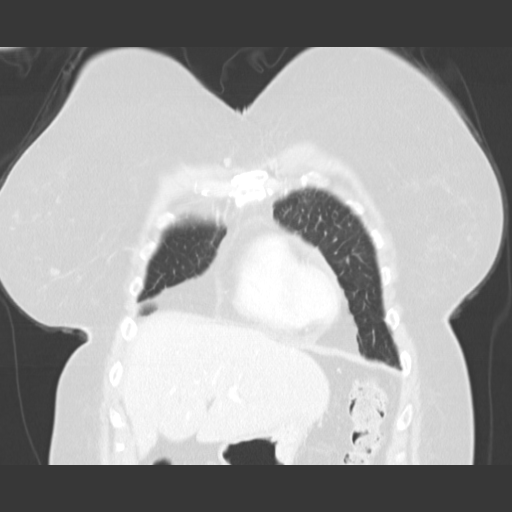
[im 34/84  lung]
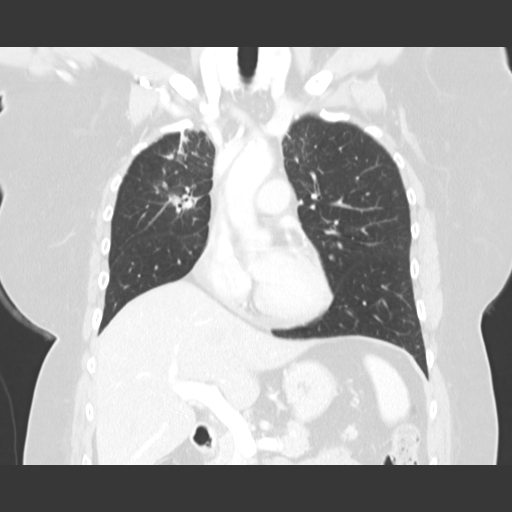
[im 50/84  lung]
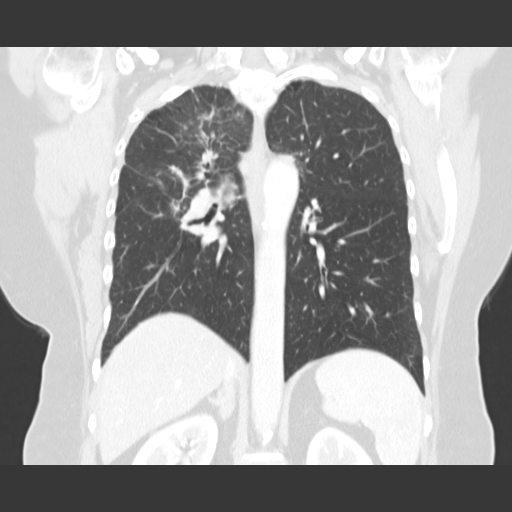

[15 of 36 positions shown; findings below may reference images not displayed]

FINDINGS: No chest wall abnormality.  No evidence of axillary or
supraclavicular lymphadenopathy.  There are a small nodules within
the thyroid gland which are unchanged from prior.  There is ill-
defined right lower paratracheal lymph node measuring 9 mm short
axis which is unchanged from prior.  No evidence of new
lymphadenopathy.  There is a very mild pericardial thickening which
is unchanged from prior.

There is linear fibrotic change in the left lung apex with
bronchiectasis and some associated nodules inferiorly.  There is
some increase in the influence of this nodularity in the superior
aspect of the right lower lobe (image 24).  Airways appear normal.

Limited view of the upper abdomen demonstrates normal adrenal
glands.  No focal hepatic lesion identified.

Limited view of the skeleton is unremarkable.
IMPRESSION: 1.  No evidence of lung cancer recurrence.
2.  Increased nodularity along the inferior aspect of the fibrotic
change in right upper lobe is felt to represent to progression of
radiation change.  Recommend attention on follow-up.

## 2010-10-15 ENCOUNTER — Ambulatory Visit: Payer: Self-pay | Admitting: Internal Medicine

## 2010-10-20 ENCOUNTER — Ambulatory Visit (HOSPITAL_COMMUNITY)
Admission: RE | Admit: 2010-10-20 | Discharge: 2010-10-20 | Payer: Self-pay | Source: Home / Self Care | Attending: Internal Medicine | Admitting: Internal Medicine

## 2010-10-20 LAB — CMP (CANCER CENTER ONLY)
AST: 22 U/L (ref 11–38)
Albumin: 3.7 g/dL (ref 3.3–5.5)
Alkaline Phosphatase: 101 U/L — ABNORMAL HIGH (ref 26–84)
Chloride: 105 mEq/L (ref 98–108)
Glucose, Bld: 85 mg/dL (ref 73–118)
Potassium: 4.2 mEq/L (ref 3.3–4.7)
Sodium: 143 mEq/L (ref 128–145)
Total Protein: 7.2 g/dL (ref 6.4–8.1)

## 2010-10-20 LAB — CBC WITH DIFFERENTIAL/PLATELET
EOS%: 2.3 % (ref 0.0–7.0)
Eosinophils Absolute: 0.1 10*3/uL (ref 0.0–0.5)
MCV: 91.8 fL (ref 79.5–101.0)
MONO%: 10.9 % (ref 0.0–14.0)
NEUT#: 3.3 10*3/uL (ref 1.5–6.5)
RBC: 4.45 10*6/uL (ref 3.70–5.45)
RDW: 14.5 % (ref 11.2–14.5)
lymph#: 1.1 10*3/uL (ref 0.9–3.3)

## 2010-10-28 ENCOUNTER — Ambulatory Visit: Payer: Self-pay | Admitting: Internal Medicine

## 2010-10-28 ENCOUNTER — Encounter: Payer: Self-pay | Admitting: Internal Medicine

## 2010-11-14 ENCOUNTER — Encounter: Payer: Self-pay | Admitting: Internal Medicine

## 2010-11-25 NOTE — Letter (Signed)
[facility]   Imported By: Maryln Gottron 04/19/2010 15:21:10  _____________________________________________________________________  External Attachment:    Type:   Image     Comment:   External Document

## 2010-11-25 NOTE — Assessment & Plan Note (Signed)
Summary: dry cough for 3 or 4 days/cjr   Vital Signs:  Patient profile:   65 year old female Height:      62 inches (157.48 cm) Weight:      176.50 pounds (80.23 kg) BMI:     32.40 Temp:     98.4 degrees F (36.89 degrees C) oral BP sitting:   112 / 66  (left arm) Cuff size:   regular  Vitals Entered By: Lucious Groves CMA (July 01, 2010 11:51 AM) CC: C/O dry cough x3-4 days./kb Is Patient Diabetic? Yes Pain Assessment Patient in pain? no      Comments Patient notes that she has been having fever and HA, but denies congestion and mucous production. Per patient no refills needed at this time./kb   Primary Care Provider:  Dr. Cato Mulligan  CC:  C/O dry cough x3-4 days./kb.  History of Present Illness: hx lung CA now with cough for 3-4 days non productive no fever , chills tried otc meds--no relief  All other systems reviewed and were negative   Current Medications (verified): 1)  Humulin 70/30 70-30 % Susp (Insulin Isophane & Regular) .... Inject 20 Unit Subcutaneously Am, and 15 Units Q Pm 2)  Onetouch Ultra Test   Strp (Glucose Blood) .... Use As Directed Three Times A Day 3)  Hydrocodone-Acetaminophen 5-500 Mg Tabs (Hydrocodone-Acetaminophen) .... As Needed 4)  Simvastatin 10 Mg Tabs (Simvastatin) .... Take 1 Tablet By Mouth At Bedtime  Allergies (verified): 1)  ! 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   Head:  normocephalic and atraumatic.   Eyes:  pupils equal and pupils round.   Ears:  R ear normal and L ear normal.   Neck:  No deformities, masses, or tenderness noted. Chest Wall:  No deformities, masses, or tenderness noted. Lungs:  normal respiratory effort and no intercostal retractions.  normal breath sounds, no dullness, and no fremitus.   Heart:  normal rate and regular rhythm.   Abdomen:  soft and non-tender.     Impression & Recommendations:  Problem # 1:  URI (ICD-465.9)  no evidence of bacterial infection. call for any concerns, increased sxs, fever, persistence of sxs, wheeze, SOB.  hx lung ca---check cxr  Orders: T-2 View CXR (71020TC)  Her updated medication list for this problem includes:    Hydrocodone-homatropine 5-1.5 Mg/37ml Syrp (Hydrocodone-homatropine) .Marland Kitchen... 1 tsp three times a day as needed cough  Problem # 2:  DIABETES MELLITUS, TYPE II (ICD-250.00)  Her updated medication list for this problem includes:    Humulin 70/30 70-30 % Susp (Insulin isophane & regular) ..... 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 Inject 20 unit subcutaneously am, and 15 units q pm 2)  Onetouch Ultra Test Strp (Glucose blood) .... Use as directed three times a day 3)   Hydrocodone-acetaminophen 5-500 Mg Tabs (Hydrocodone-acetaminophen) .... As needed 4)  Simvastatin 10 Mg Tabs (Simvastatin) .... Take 1 tablet by mouth at bedtime 5)  Hydrocodone-homatropine 5-1.5 Mg/73ml Syrp (Hydrocodone-homatropine) .Marland Kitchen.. 1 tsp three times a day as needed cough Prescriptions: HYDROCODONE-HOMATROPINE 5-1.5 MG/5ML SYRP (HYDROCODONE-HOMATROPINE) 1 tsp three times a day as needed cough  #240 cc x 0   Entered and Authorized by:   Birdie Sons MD   Signed by:   Birdie Sons MD on 07/01/2010   Method used:   Print then Give to Patient   RxID:   [id number]

## 2010-11-25 NOTE — Assessment & Plan Note (Signed)
Summary: 3 mo rov/mm   Vital Signs:  Patient profile:   65 year old female Weight:      175 pounds BMI:     32.12 Temp:     97.7 degrees F oral Pulse rate:   96 / minute Pulse rhythm:   regular Resp:     14 per minute BP sitting:   122 / 60  (left arm) Cuff size:   regular  Vitals Entered By: Gladis Riffle, RN (January 22, 2010 10:58 AM) CC: 3 month rov--CBGs 131-198 in AM and  111-239 in PM at home Is Patient Diabetic? Yes Did you bring your meter with you today? No   Primary Care Provider:  Dr. Cato Mulligan  CC:  3 month rov--CBGs 131-198 in AM and  111-239 in PM at home.  History of Present Illness:  Follow-Up Visit      This is a 65 year old woman who presents for Follow-up visit.  The patient denies chest pain and palpitations.  Since the last visit the patient notes no new problems or concerns.  The patient reports taking meds as prescribed and not taking meds as prescribed.  When questioned about possible medication side effects, the patient notes none.        Since the last visit the patient notes being seen by a specialist.  sees oncology  All other systems reviewed and were negative   Preventive Screening-Counseling & Management  Alcohol-Tobacco     Smoking Status: quit > 6 months     Smoking Cessation Counseling: yes     Smoke Cessation Stage: contemplative     Packs/Day: 1.0     Year Started: 1970     Year Quit: 2010     Pack years: 40  Current Problems (verified): 1)  Tobacco Use, Quit  (ICD-V15.82) 2)  Carcinoma, Lung, Small Cell  (ICD-162.9) 3)  Hyperlipidemia  (ICD-272.4) 4)  Gerd  (ICD-530.81) 5)  Diabetes Mellitus, Type II  (ICD-250.00)  Current Medications (verified): 1)  Humulin 70/30 70-30 % Susp (Insulin Isophane & Regular) .... Inject 20 Unit Subcutaneously Am, and 15 Units Q Pm 2)  Onetouch Ultra Test   Strp (Glucose Blood) .... Use As Directed Three Times A Day 3)  Hydrocodone-Acetaminophen 5-500 Mg Tabs (Hydrocodone-Acetaminophen) .... As  Needed  Allergies: 1)  ! 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   Head:  normocephalic and no abnormalities observed.   Eyes:  pupils equal and pupils round.   Ears:  R ear normal and L ear normal.   Nose:  no external deformity and no external erythema.   Neck:  No deformities, masses, or tenderness noted. Lungs:  normal respiratory effort and no intercostal retractions.   Heart:  normal rate and regular rhythm.   Abdomen:  soft and non-tender.   Msk:  No deformity or scoliosis noted of thoracic or  lumbar spine.   Neurologic:  cranial nerves II-XII intact and gait normal.     Impression & Recommendations:  Problem # 1:  DIABETES MELLITUS, TYPE II (ICD-250.00)  controlled continue current medications  home CBGs 100-150 Her updated medication list for this problem includes:    Humulin 70/30 70-30 % Susp (Insulin isophane & regular) ..... Inject 20 unit subcutaneously am, and 15 units q pm  Labs Reviewed: Creat: 0.7 (10/30/2009)     Last Eye Exam: normalpt's report (03/24/2009) Reviewed HgBA1c results: 6.0 (10/30/2009)  6.2 (07/29/2009)  Orders: TLB-A1C / Hgb A1C  (Glycohemoglobin) (83036-A1C) TLB-BMP (Basic Metabolic Panel-BMET) (80048-METABOL) TLB-Lipid Panel (80061-LIPID) TLB-ALT (SGPT) (84460-ALT)  Problem # 2:  HYPERLIPIDEMIA (ICD-272.4) controlled continue current medications    Labs Reviewed: SGOT: 17 (07/29/2009)   SGPT: 13 (10/30/2009)  Lipid Goals: Chol Goal: 200 (05/22/2007)   HDL Goal: 40 (05/22/2007)   LDL Goal: 70 (05/22/2007)   TG Goal: 150 (05/22/2007)  Prior 10 Yr Risk Heart Disease: N/A (05/22/2007)   HDL:33.10 (10/30/2009), 18.70 (04/15/2009)  LDL:131 (07/10/2008), DEL (04/03/2008)  Chol:224 (10/30/2009), 141 (04/15/2009)  Trig:122.0 (10/30/2009), 274.0 (04/15/2009)  Problem # 3:  CARCINOMA, LUNG, 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 Inject 20 unit subcutaneously am, and 15 units q pm 2)  Onetouch Ultra Test Strp (Glucose blood) .... Use as directed three times a day 3)  Hydrocodone-acetaminophen 5-500 Mg Tabs (Hydrocodone-acetaminophen) .... As needed  Appended Document: Orders Update     Clinical Lists Changes  Orders: Added new Service order of Venipuncture [phone]) - Signed

## 2010-11-25 NOTE — Letter (Signed)
Summary: [facility]  [facility]   Imported By: Maryln Gottron 08/09/2010 10:24:25  _____________________________________________________________________  External Attachment:    Type:   Image     Comment:   External Document

## 2010-11-25 NOTE — Assessment & Plan Note (Signed)
Summary: BLADDER INF (F/U FROM ED VISIT) // RS   Vital Signs:  Patient profile:   65 year old female Weight:      176 pounds BMI:     32.31 Temp:     98.6 degrees F oral Pulse rate:   60 / minute Pulse rhythm:   regular Resp:     12 per minute BP sitting:   118 / 78  (left arm) Cuff size:   regular  Vitals Entered By: Gladis Riffle, RN (April 09, 2010 9:21 AM) CC: FU ED for uti, states sx intermittent--discuss diabetic shoes Is Patient Diabetic? Yes Did you bring your meter with you today? No Comments CBGs 155 at home   Primary Care Provider:  Dr. Cato Mulligan  CC:  FU ED for uti and states sx intermittent--discuss diabetic shoes.  History of Present Illness:  Follow-Up Visit      This is a 65 year old woman who presents for Follow-up visit.  The patient denies chest pain and palpitations.  Since the last visit the patient notes a recent ED visit.  The patient reports taking meds as prescribed.  When questioned about possible medication side effects, the patient notes none.  Went to ED for abdominal pain---completed abx for UTI  All other systems reviewed and were negative   Preventive Screening-Counseling & Management  Alcohol-Tobacco     Smoking Status: quit > 6 months     Smoking Cessation Counseling: yes     Smoke Cessation Stage: contemplative     Packs/Day: 1.0     Year Started: 1970     Year Quit: 2010     Pack years: 40  Current Problems (verified): 1)  Tobacco Use, Quit  (ICD-V15.82) 2)  Carcinoma, Lung, Small Cell  (ICD-162.9) 3)  Hyperlipidemia  (ICD-272.4) 4)  Gerd  (ICD-530.81) 5)  Diabetes Mellitus, Type II  (ICD-250.00)  Current Medications (verified): 1)  Humulin 70/30 70-30 % Susp (Insulin Isophane & Regular) .... Inject 20 Unit Subcutaneously Am, and 15 Units Q Pm 2)  Onetouch Ultra Test   Strp (Glucose Blood) .... Use As Directed Three Times A Day 3)  Hydrocodone-Acetaminophen 5-500 Mg Tabs (Hydrocodone-Acetaminophen) .... As Needed  Allergies: 1)  !  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   Head:  normocephalic and atraumatic.   Eyes:  pupils equal and pupils round.   Ears:  R ear normal and L ear normal.   Neck:  No deformities, masses, or tenderness noted. Chest Wall:  No deformities, masses, or tenderness noted. Lungs:  normal respiratory effort and no intercostal retractions.   Abdomen:  soft and non-tender.     Impression & Recommendations:  Problem # 1:  ABDOMINAL PAIN (ICD-789.00) resolved reviewed results form ED, including reviewing CT  has completed antibiotic---culture unremarkable  no further eval at this time  Complete Medication List: 1)  Humulin  70/30 70-30 % Susp (Insulin isophane & regular) .... Inject 20 unit subcutaneously am, and 15 units q pm 2)  Onetouch Ultra Test Strp (Glucose blood) .... Use as directed three times a day 3)  Hydrocodone-acetaminophen 5-500 Mg Tabs (Hydrocodone-acetaminophen) .... As needed

## 2010-11-25 NOTE — Assessment & Plan Note (Signed)
Summary: 4 month rov.njr   Vital Signs:  Patient profile:   65 year old female Weight:      179 pounds Temp:     98.4 degrees F oral Pulse rate:   96 / minute Pulse rhythm:   regular BP sitting:   114 / 70  (left arm) Cuff size:   regular  Vitals Entered By: Alfred Levins, CMA (August 25, 2010 10:35 AM)  Primary Care Provider:  Dr. Cato Mulligan   History of Present Illness:  Follow-Up Visit      This is a 65 year old woman who presents for Follow-up visit.  The patient denies chest pain and palpitations.  Since the last visit the patient notes no new problems or concerns and being seen by a specialist.  The patient reports taking meds as prescribed, monitoring blood sugars, dietary noncompliance, and not exercising.  When questioned about possible medication side effects, the patient notes none.  she sees Dr. Shirline Frees regularly for followup lung cancer.  She denies any chest pain, shortness breath, PND, orthopnea. No lower to edema. No rashes. No neurologic deficits. Mood is good. No other complaints in a complete review of systems.  Current Medications (verified): 1)  Humulin 70/30 70-30 % Susp (Insulin Isophane & Regular) .... Inject 20 Unit Subcutaneously Am, and 15 Units Q Pm 2)  Simvastatin 10 Mg Tabs (Simvastatin) .... Take 1 Tablet By Mouth At Bedtime  Allergies (verified): 1)  ! Advil 2)  Ibuprofen (Ibuprofen)  Physical Exam  General:  well-developed female in no acute distress. HEENT exam atraumatic, normocephalic symmetric her muscles are intact. Neck is supple. Chest heart auscultation cardiac exam S1-S2 are regular. Abdominal exam overweight psych bowel sounds, soft her extremities there is no clubbing cyanosis or edema.   Impression & Recommendations:  Problem # 1:  DIABETES MELLITUS, TYPE II (ICD-250.00)  given permission to gradually increase  insulin to 25/25 Her updated medication list for this problem includes:    Humulin 70/30 70-30 % Susp (Insulin isophane &  regular) ..... 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 Take 1 tablet by mouth at bedtime  Labs Reviewed: SGOT: 19 (04/27/2010)   SGPT: 18 (04/27/2010)  Lipid Goals: Chol Goal: 200 (05/22/2007)   HDL Goal: 40 (05/22/2007)   LDL Goal: 70 (05/22/2007)   TG Goal: 150 (05/22/2007)  Prior 10 Yr Risk Heart Disease: N/A (05/22/2007)   HDL:35.10 (04/27/2010), 36.60 (01/22/2010)  LDL:131 (07/10/2008), DEL (04/03/2008)  Chol:226 (04/27/2010), 230 (01/22/2010)  Trig:248.0 (04/27/2010), 181.0 (01/22/2010)  Orders: Specimen Handling (14782) TLB-Lipid Panel (80061-LIPID) TLB-Hepatic/Liver Function Pnl (80076-HEPATIC) TLB-TSH (Thyroid Stimulating Hormone) (84443-TSH)  Complete Medication List: 1)  Humulin 70/30 70-30 % Susp (Insulin isophane & regular) .... Inject 20 unit subcutaneously am, and 15 units q pm 2)  Simvastatin 10 Mg Tabs (Simvastatin) .... Take 1 tablet by mouth at bedtime  Other Orders: Admin 1st Vaccine (95621) Flu Vaccine 76yrs + (30865) Flu Vaccine Consent Questions     Do you have a history of severe allergic reactions to this vaccine? no  Any prior history of allergic reactions to egg and/or gelatin? no    Do you have a sensitivity to the preservative Thimersol? no    Do you have a past history of Guillan-Barre Syndrome? no    Do you currently have an acute febrile illness? no    Have you ever had a severe reaction to latex? no    Vaccine information given and explained to patient? yes     Are you currently pregnant? no    Lot Number:[id number]   Exp Date:04/23/2011   Site Given  Right Deltoid IM  Orders Added: 1)  Admin 1st Vaccine [90471] 2)  Flu Vaccine 19yrs + [90658] 3)  Est. Patient Level IV [34742] 4)  Venipuncture [59563] 5)  Specimen Handling [99000] 6)  TLB-A1C / Hgb A1C (Glycohemoglobin) [83036-A1C] 7)  TLB-Lipid Panel [80061-LIPID] 8)  TLB-Hepatic/Liver Function Pnl [80076-HEPATIC] 9)  TLB-TSH (Thyroid Stimulating Hormone) [84443-TSH]   .lbflu1

## 2010-11-25 NOTE — Letter (Signed)
Summary: [facility]  [facility]   Imported By: Maryln Gottron 08/16/2010 10:57:00  _____________________________________________________________________  External Attachment:    Type:   Image     Comment:   External Document

## 2010-11-25 NOTE — Assessment & Plan Note (Signed)
Summary: 3 month rov/nnjr/pt rsc from/cjr   Vital Signs:  Patient profile:   65 year old female Weight:      177 pounds BMI:     32.49 Cuff size:   regular  Vitals Entered By: Gladis Riffle, RN (April 27, 2010 11:17 AM) CC: 3 month rov--CBGs 158-268 at home Is Patient Diabetic? Yes Did you bring your meter with you today? No   Primary Care Provider:  Dr. Cato Mulligan  CC:  3 month rov--CBGs 158-268 at home.  History of Present Illness:  Follow-Up Visit      This is a 65 year old woman who presents for Follow-up visit.  The patient denies chest pain and palpitations.  Since the last visit the patient notes no new problems or concerns except noted mild mid thoracic back pain this a.m.  The patient reports taking meds as prescribed.  When questioned about possible medication side effects, the patient notes none.    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 Inject 20 Unit Subcutaneously Am, and 15 Units Q Pm 2)  Onetouch Ultra Test   Strp (Glucose Blood) .... Use As Directed Three Times A Day 3)  Hydrocodone-Acetaminophen 5-500 Mg Tabs (Hydrocodone-Acetaminophen) .... As Needed  Allergies: 1)  ! 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   Head:  normocephalic and atraumatic.   Eyes:  pupils equal and pupils round.    Diabetes Management Exam:    Eye Exam:       Eye Exam done elsewhere          Date: 03/24/2010          Results: normal-pts' report          Done by: ophtha   Impression & Recommendations:  Problem # 1:  TOBACCO USE, QUIT (ICD-V15.82)  Problem # 2:  CARCINOMA, LUNG, SMALL CELL (ICD-162.9) followed by oncology  Problem # 3:  DIABETES MELLITUS, TYPE II (ICD-250.00)  check labs today Her updated medication list for this problem includes:    Humulin 70/30 70-30 % Susp (Insulin isophane & regular) ..... Inject 20 unit subcutaneously am, and 15 units q pm  Labs Reviewed: Creat: 0.7 (01/22/2010)     Last  Eye Exam: normal-pts' report (03/24/2010) Reviewed HgBA1c results: 6.6 (01/22/2010)  6.0 (10/30/2009)  Orders: TLB-BMP (Basic Metabolic Panel-BMET) (80048-METABOL) TLB-A1C / Hgb A1C (Glycohemoglobin) (83036-A1C)  Problem # 4:  HYPERLIPIDEMIA (ICD-272.4)  check labs---may need treatment Labs Reviewed: SGOT: 17 (07/29/2009)   SGPT: 16 (01/22/2010)  Lipid Goals: Chol Goal: 200 (05/22/2007)   HDL Goal: 40 (05/22/2007)   LDL Goal: 70 (05/22/2007)   TG Goal: 150 (05/22/2007)  Prior 10 Yr Risk Heart Disease: N/A (05/22/2007)   HDL:36.60 (01/22/2010), 33.10 (10/30/2009)   LDL:131 (07/10/2008), DEL (04/03/2008)  Chol:230 (01/22/2010), 224 (10/30/2009)  Trig:181.0 (01/22/2010), 122.0 (10/30/2009)  Orders: Venipuncture (16109) TLB-Lipid Panel (80061-LIPID) TLB-Hepatic/Liver Function Pnl (80076-HEPATIC)  Complete Medication List: 1)  Humulin 70/30 70-30 % Susp (Insulin isophane & regular) .... Inject 20 unit subcutaneously am, and 15 units q pm 2)  Onetouch Ultra Test Strp (Glucose blood) .... Use as directed three times a day 3)  Hydrocodone-acetaminophen 5-500 Mg Tabs (Hydrocodone-acetaminophen) .... As needed  Patient Instructions: 1)  Please schedule a follow-up appointment in 4 months.

## 2010-11-25 NOTE — Progress Notes (Signed)
Summary: prior auth for body scan  Phone Note Call from Patient   Caller: vm Summary of Call: Wants Dr. Armanda Magic to know that she had to sign a paper because Medicaid won't pay for brain test.  Have appt Wed for body scan.  Need prior auth to do it.   Initial call taken by: Rudy Jew, RN,  July 19, 2010 8:15 AM  Follow-up for Phone Call        I called pt's home and husband asked me about this. I explained that we sent pt for a Chest CT that was approved by Medicaid.  We did not send pt for any test to the brain and explained that the pt needs to get in touch with the doctor that referred her. Follow-up by: Corky Mull,  July 19, 2010 9:18 AM

## 2010-11-25 NOTE — Progress Notes (Signed)
Summary: paper work questions?  Phone Note Call from Patient   Caller: Patient Call For: Birdie Sons MD Summary of Call: Pt states she brought "papers" over last Friday for Dr. Cato Mulligan, and wants to know if he got them?  She does not know what they were or who sent them to her. [phone] Initial call taken by: Lynann Beaver CMA,  January 26, 2010 11:15 AM  Follow-up for Phone Call        papers are for assistance with TEVA meds (proair).  Pt no longer taking.  Family notified and gave permission to shred papers. Follow-up by: Gladis Riffle, RN,  January 26, 2010 4:30 PM

## 2010-11-25 NOTE — Assessment & Plan Note (Signed)
Summary: sore throat/headache/body aches/stuffy nose/cjr   Vital Signs:  Patient profile:   65 year old female Weight:      177 pounds Temp:     98.6 degrees F oral BP sitting:   112 / 74  (left arm) Cuff size:   regular  Vitals Entered By: Alfred Levins, CMA (October 04, 2010 11:48 AM) CC: st, h/a, body aches x2 days   Primary Care Provider:  Dr. Cato Mulligan  CC:  st, h/a, and body aches x2 days.  History of Present Illness: ST and headache and myalgias for 2 days no fever or chills, no sweats but she feels "cold"  All other systems reviewed and were negative except for fatigue  Current Medications (verified): 1)  Humulin 70/30 70-30 % Susp (Insulin Isophane & Regular) .... Inject 20 Unit Subcutaneously Am, and 15 Units Q Pm 2)  Simvastatin 10 Mg Tabs (Simvastatin) .... Take 1 Tablet By Mouth At Bedtime  Allergies (verified): 1)  ! 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   Head:  normocephalic and atraumatic.   Eyes:  pupils equal and pupils round.   Mouth:  pharynx pink and moist.   Neck:  No deformities, masses, or tenderness noted. Skin:  turgor normal and color normal.       Impression & Recommendations:  Problem # 1:  CARCINOMA, LUNG, SMALL CELL (ICD-162.9) followed by oncology  Problem # 2:  URI (ICD-465.9) given immuosuppressed status better to treat with ABX  Complete Medication List: 1)  Humulin 70/30 70-30 % Susp (Insulin isophane & regular) .... Inject 20 unit subcutaneously am, and 15 units q pm 2)  Simvastatin 10 Mg Tabs (Simvastatin) .... Take 1 tablet by mouth at bedtime 3)  Doxycycline Hyclate 100 Mg Caps (Doxycycline hyclate) .... Take 1 tab twice a day Prescriptions: DOXYCYCLINE HYCLATE 100 MG CAPS (DOXYCYCLINE HYCLATE) Take 1 tab twice a day  #20 x 0   Entered and Authorized by:   Birdie Sons MD   Signed by:   Birdie Sons MD on 10/04/2010   Method used:   Electronically to        [facility] [address] (retail)       [phone]       [place], [place]  [address]       Ph: [phone]       Fax: [phone]   RxID:   [id number]    Orders Added: 1)  Est. Patient Level III [24401]

## 2010-11-25 NOTE — Progress Notes (Signed)
Summary: request  Phone Note Other Incoming Call back at [phone]   Caller: Tempie [employer] at [employer] [facility] Summary of Call: Asking status of diabetic shoes request. Initial call taken by: Gladis Riffle, RN,  April 14, 2010 3:04 PM  Follow-up for Phone Call        Per dr swords these are not necessary at this time. Follow-up by: Gladis Riffle, RN,  April 14, 2010 3:05 PM  Additional Follow-up for Phone Call Additional follow up Details #1::        Called back [facility] to inform them. Additional Follow-up by: Gladis Riffle, RN,  April 14, 2010 3:05 PM

## 2010-11-25 NOTE — Letter (Signed)
Summary: [facility]  [facility]   Imported By: Sherian Rein 10/30/2009 09:09:07  _____________________________________________________________________  External Attachment:    Type:   Image     Comment:   External Document

## 2010-11-25 NOTE — Letter (Signed)
[facility]   Imported By: Maryln Gottron 01/20/2010 10:29:18  _____________________________________________________________________  External Attachment:    Type:   Image     Comment:   External Document

## 2010-11-25 NOTE — Progress Notes (Signed)
Summary: REQUEST FOR RX  Phone Note Call from Patient   Caller: Patient  [phone] Summary of Call: Pt adv she is still exp productive cough, congestion...offered OV but pt was just seen a couple of days ago.... Would like to have Rx for cough med sent to [facility].  Initial call taken by: Debbra Riding,  October 07, 2010 8:12 AM  Follow-up for Phone Call        Hydromet 1 teaspoon p.o. b.i.d. p.r.n. cough. 120 mL, no refills Follow-up by: Birdie Sons MD,  October 07, 2010 10:17 AM  Additional Follow-up for Phone Call Additional follow up Details #1::        Phone Call Completed, Rx Called In Additional Follow-up by: Alfred Levins, CMA,  October 07, 2010 11:56 AM

## 2010-11-25 NOTE — Assessment & Plan Note (Signed)
Summary: 3 mo rov/mm   Vital Signs:  Patient profile:   65 year old female Weight:      161 pounds Temp:     98.4 degrees F Pulse rate:   80 / minute Resp:     12 per minute BP sitting:   108 / 62  (left arm)  Vitals Entered By: Gladis Riffle, RN (October 30, 2009 8:17 AM)   Primary Care Provider:  Dr. Cato Mulligan   History of Present Illness:  Follow-Up Visit      This is a 65 year old woman who presents for Follow-up visit.  The patient denies chest pain, palpitations, dizziness, syncope, edema, SOB, DOE, PND, and orthopnea.  Since the last visit the patient notes no new problems or concerns.  The patient reports taking meds as prescribed.  When questioned about possible medication side effects, the patient notes none.    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 Inject 20 Unit Subcutaneously Am, and 15 Units Q Pm 2)  Onetouch Ultra Test   Strp (Glucose Blood) .... Use As Directed Three Times A Day 3)  Hydrocodone-Acetaminophen 5-500 Mg Tabs (Hydrocodone-Acetaminophen) .... As Needed  Allergies: 1)  ! Advil 2)  Ibuprofen (Ibuprofen)  Comments:  Nurse/Medical Assistant: 3 month rov, fasting--CBGs fasting119-158, PM 103-184 at home--has reversed insulin to 15 U in AM and 20 U in PM  The patient's medications and allergies were reviewed with the patient and were updated in the Medication and Allergy Lists. 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   Eyes:  pupils equal and pupils round.   Ears:  R ear normal and L ear normal.   Neck:  No deformities, masses, or tenderness noted. Chest Wall:  No deformities, masses, or tenderness noted. Lungs:  Normal respiratory effort, chest expands symmetrically. Lungs are clear to auscultation, no crackles or wheezes. Heart:  normal rate and regular rhythm.   Abdomen:  Bowel sounds positive,abdomen soft and non-tender without masses, organomegaly or hernias noted. Msk:  No deformity or scoliosis noted of thoracic or lumbar spine.   Pulses:  R radial normal and L radial normal.   Neurologic:  cranial  nerves II-XII intact and gait normal.     Impression & Recommendations:  Problem # 1:  CARCINOMA, LUNG, SMALL CELL (ICD-162.9) has regular f/u with oncology reviewed dr Asa Lente note  Problem # 2:  DIABETES MELLITUS, TYPE II (ICD-250.00)  check labs today Her updated medication list for this problem includes:    Humulin 70/30 70-30 % Susp (Insulin isophane & regular) ..... Inject 20 unit subcutaneously am, and 15  units q pm  Labs Reviewed: Creat: 0.8 (07/29/2009)     Last Eye Exam: normalpt's report (03/24/2009) Reviewed HgBA1c results: 6.2 (07/29/2009)  6.6 (04/15/2009)  Problem # 3:  GERD (ICD-530.81) no sxs well controlled and not on any meds  Complete Medication List: 1)  Humulin 70/30 70-30 % Susp (Insulin isophane & regular) .... Inject 20 unit subcutaneously am, and 15 units q pm 2)  Onetouch Ultra Test Strp (Glucose blood) .... Use as directed three times a day 3)  Hydrocodone-acetaminophen 5-500 Mg Tabs (Hydrocodone-acetaminophen) .... As needed  Other Orders: TLB-A1C / Hgb A1C (Glycohemoglobin) (83036-A1C) TLB-BMP (Basic Metabolic Panel-BMET) (80048-METABOL) TLB-Lipid Panel (80061-LIPID) TLB-ALT (SGPT) (84460-ALT) Venipuncture (52841)  Patient Instructions: 1)  Please schedule a follow-up appointment in 3 months.

## 2010-12-01 NOTE — Letter (Signed)
Summary: [facility]  [facility]   Imported By: Maryln Gottron 11/23/2010 13:30:46  _____________________________________________________________________  External Attachment:    Type:   Image     Comment:   External Document

## 2010-12-10 ENCOUNTER — Ambulatory Visit: Payer: Self-pay | Admitting: Internal Medicine

## 2011-01-06 DIAGNOSIS — J309 Allergic rhinitis, unspecified: Secondary | ICD-10-CM | POA: Insufficient documentation

## 2011-01-06 LAB — GLUCOSE, CAPILLARY: Glucose-Capillary: 95 mg/dL (ref 70–99)

## 2011-01-10 LAB — CBC
MCHC: 34.2 g/dL (ref 30.0–36.0)
MCV: 93.4 fL (ref 78.0–100.0)
Platelets: 187 10*3/uL (ref 150–400)
RBC: 4.12 MIL/uL (ref 3.87–5.11)
WBC: 5.5 10*3/uL (ref 4.0–10.5)

## 2011-01-10 LAB — POCT I-STAT, CHEM 8
Chloride: 106 mEq/L (ref 96–112)
HCT: 40 % (ref 36.0–46.0)
Hemoglobin: 13.6 g/dL (ref 12.0–15.0)
Potassium: 4.1 mEq/L (ref 3.5–5.1)
Sodium: 140 mEq/L (ref 135–145)

## 2011-01-10 LAB — URINE MICROSCOPIC-ADD ON

## 2011-01-10 LAB — URINALYSIS, ROUTINE W REFLEX MICROSCOPIC
Ketones, ur: NEGATIVE mg/dL
Protein, ur: NEGATIVE mg/dL
Urobilinogen, UA: 1 mg/dL (ref 0.0–1.0)

## 2011-01-10 LAB — URINE CULTURE: Colony Count: 45000

## 2011-01-10 LAB — DIFFERENTIAL
Basophils Relative: 0 % (ref 0–1)
Eosinophils Absolute: 0.1 10*3/uL (ref 0.0–0.7)
Monocytes Relative: 11 % (ref 3–12)
Neutro Abs: 3.6 10*3/uL (ref 1.7–7.7)
Neutrophils Relative %: 65 % (ref 43–77)

## 2011-01-11 IMAGING — CT CT CHEST W/ CM
2 of 3 series · 15 of 36 positions shown, 18 images · IV contrast (agent unspecified)
Comparison: CT 12/10/2009

CLINICAL DATA: Lung C A.  Chemotherapy and XRT complete.  Cough.
Short of breath.

CT CHEST WITH CONTRAST
TECHNIQUE: Multidetector CT imaging of the chest was performed
following the standard protocol during bolus administration of
intravenous contrast.
Contrast: 80 ml Emnipaque-N66 IV.

[Series 2: chest with st · axial · 0.62mm/px · z∈[-55,+200]mm · 12 of 61 slices shown, 15 images]
[im 5/61  mediastinal]
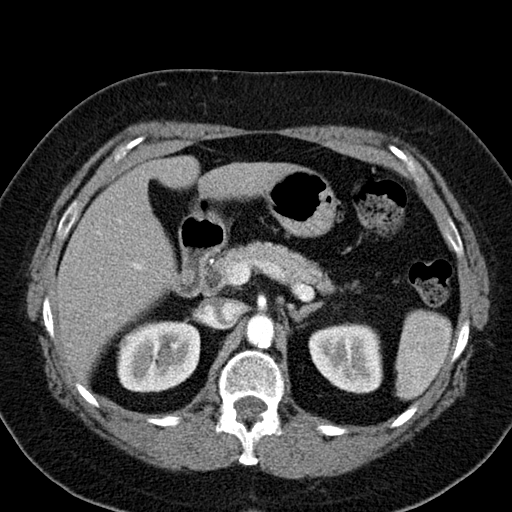
[im 5/61  lung]
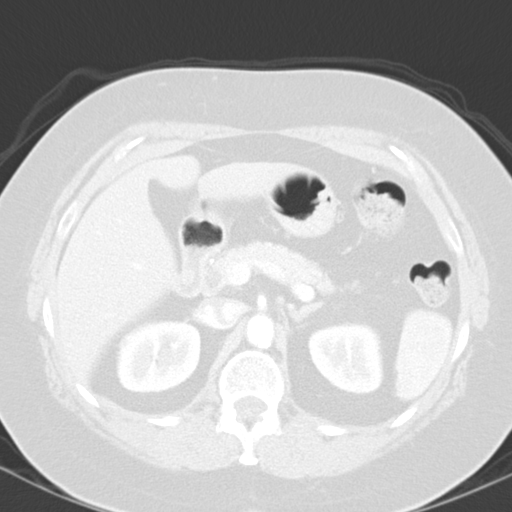
[im 9/61  lung]
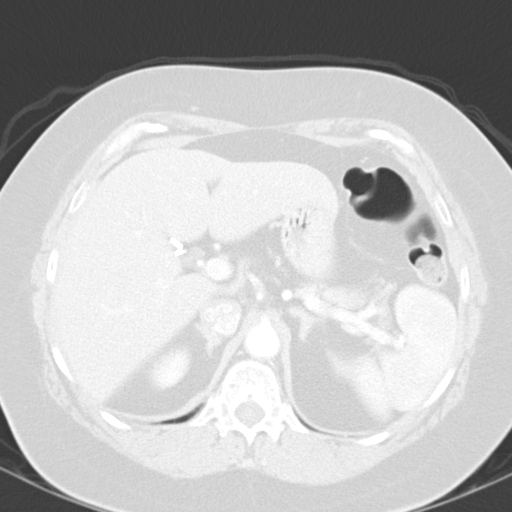
[im 14/61  lung]
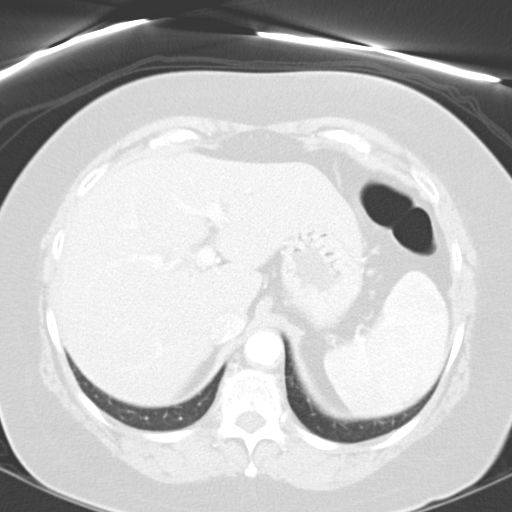
[im 18/61  lung]
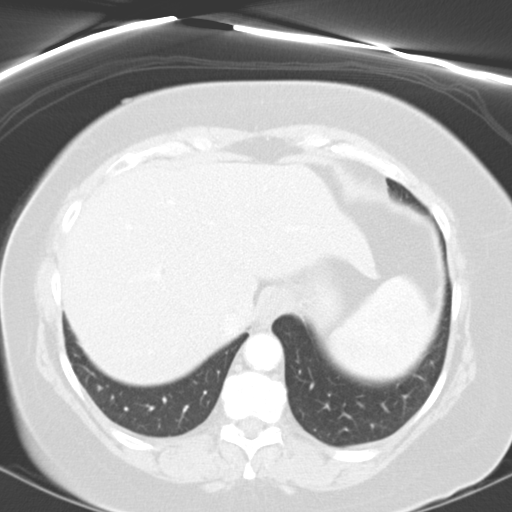
[im 23/61  mediastinal]
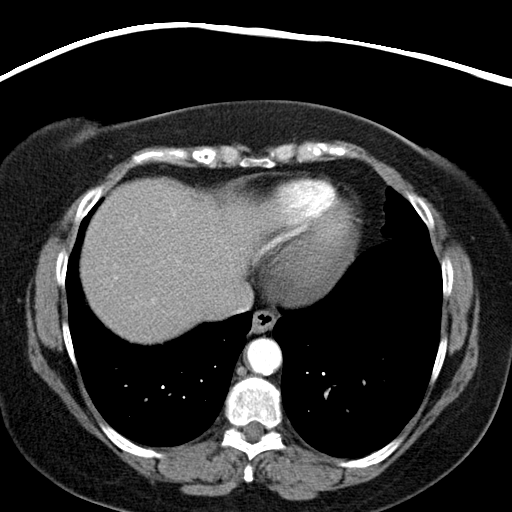
[im 23/61  lung]
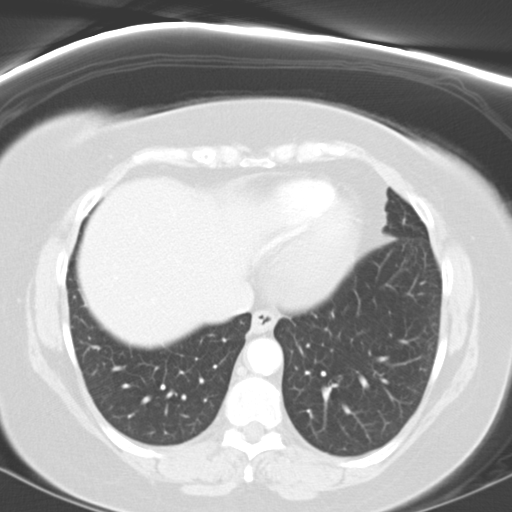
[im 27/61  lung]
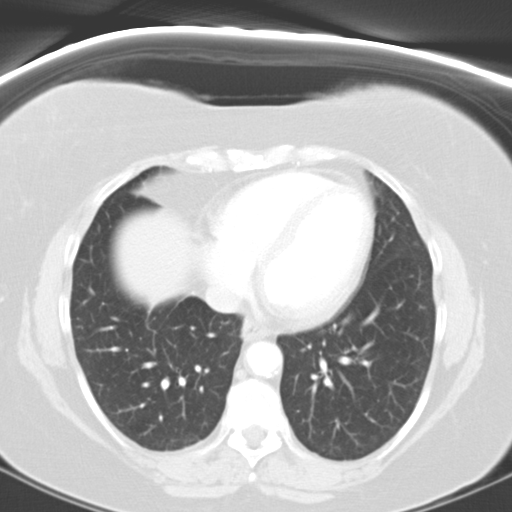
[im 34/61  lung]
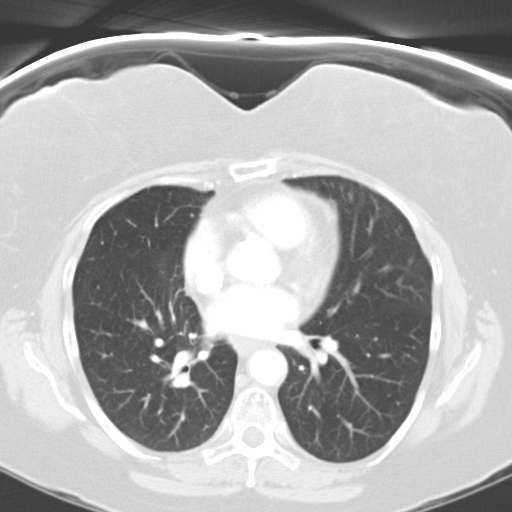
[im 38/61  lung]
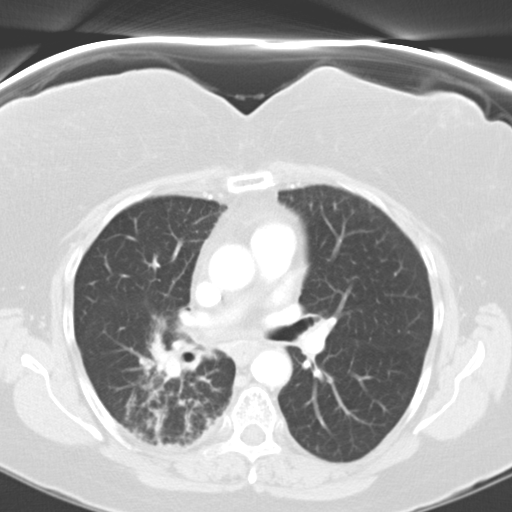
[im 43/61  mediastinal]
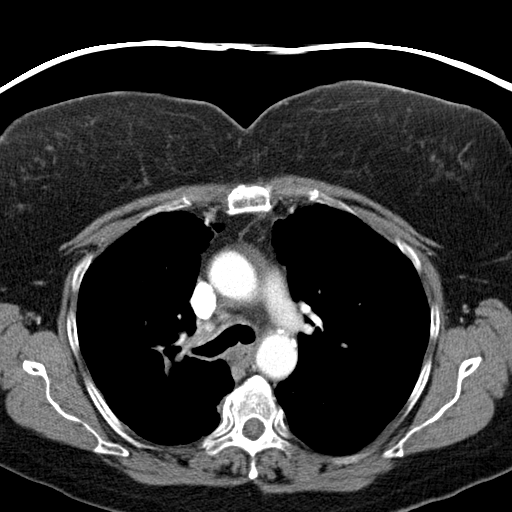
[im 43/61  lung]
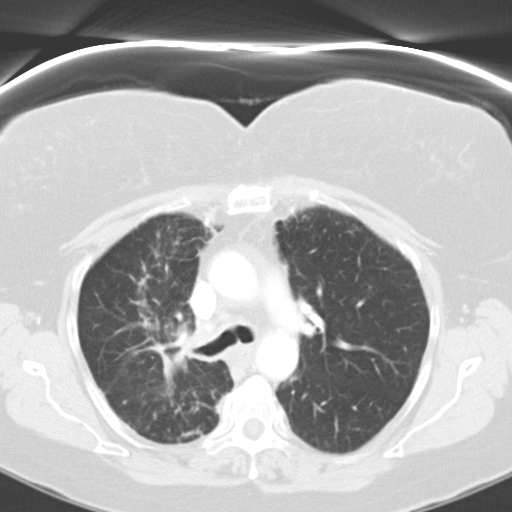
[im 47/61  lung]
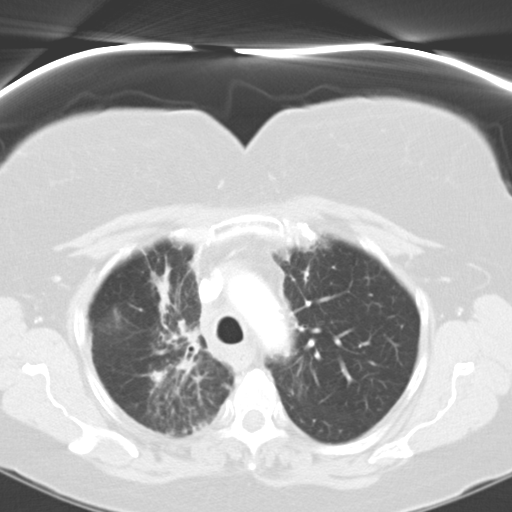
[im 52/61  lung]
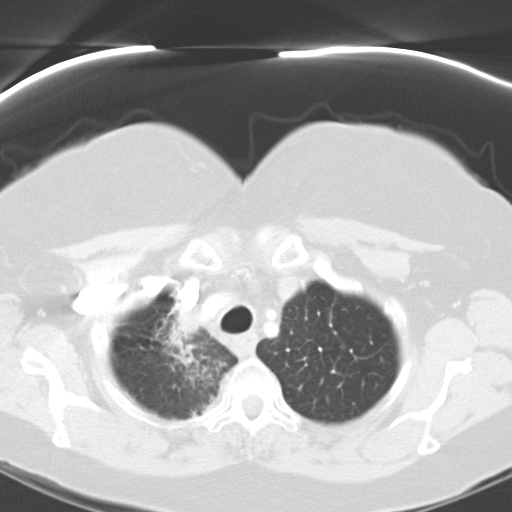
[im 56/61  lung]
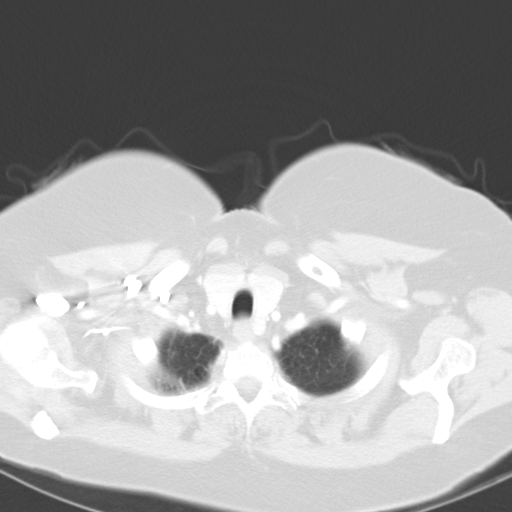

[Series 602: <mpr thick range> · coronal · 0.62mm/px · 3 of 79 slices shown]
[im 16/79  lung]
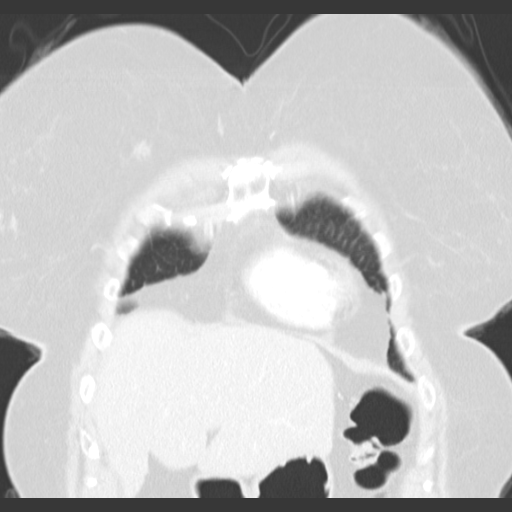
[im 32/79  lung]
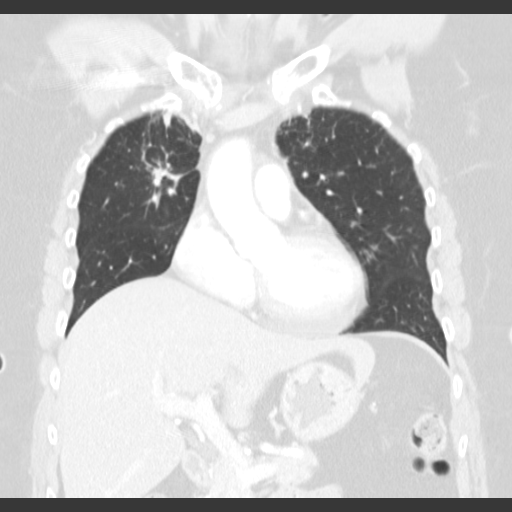
[im 47/79  lung]
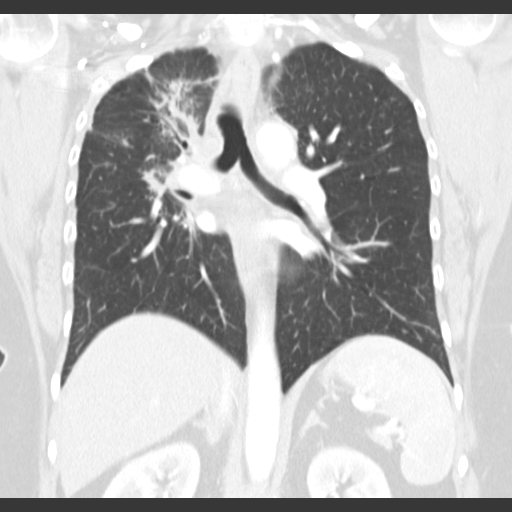

[15 of 36 positions shown; findings below may reference images not displayed]

FINDINGS: Again appreciated are small nodules within the thyroid
gland which appears stable.

Ill-defined density of the right paratracheal mediastinal fat
consistent with post therapy change.  No pathologically enlarged
lymph nodes.  No pericardial or pleural effusions.  Stable
appearance of left anterior apex with subtle nodularity and linear
densities.  More striking radiation therapy pneumonitis/ early
fibrotic changes of the medial aspect of the right chest.  This has
increased in degree.  There is some traction bronchiectasis. There
is some nodularity but no definite evidence of a tumor mass to
strongly suggest recurrent tumor.  There is thickening of the wall
of the esophagus, mainly the proximal and mid aspects compatible
with esophagitis, possibly a result of XRT.

Adrenal glands unremarkable.  Bony thorax intact.
IMPRESSION: Post therapy changes of the chest.  Increase in findings consistent
with radiation pneumonitis/fibrosis of the right paramediastinal
lung.  Findings consistent with esophagitis. No findings to
strongly suggest recurrent tumor.

## 2011-01-24 ENCOUNTER — Telehealth: Payer: Self-pay | Admitting: Internal Medicine

## 2011-01-24 ENCOUNTER — Other Ambulatory Visit: Payer: Self-pay | Admitting: Internal Medicine

## 2011-01-24 NOTE — Telephone Encounter (Signed)
Error/njr °

## 2011-01-31 IMAGING — CT CT ABD-PELV W/ CM
2 of 5 series · 17 of 46 positions shown, 19 images · IV contrast (agent unspecified)
Comparison: Small bowel follow-through of 10/28/2005.  CT
09/16/2005.

CLINICAL DATA: Right-sided abdominal/lower quadrant pain.  Chills.
Nausea vomiting.  Evaluate for appendectomy.

CT ABDOMEN AND PELVIS WITH CONTRAST
TECHNIQUE: Multidetector CT imaging of the abdomen and pelvis was
performed following the standard protocol during bolus
administration of intravenous contrast.
Contrast: 100  ml Fmnipaque-MOO

[Series 2: rtn ap with st · axial · 0.66mm/px · z∈[-450,-55]mm · 14 of 89 slices shown, 16 images]
[im 5/89  soft-tissue]
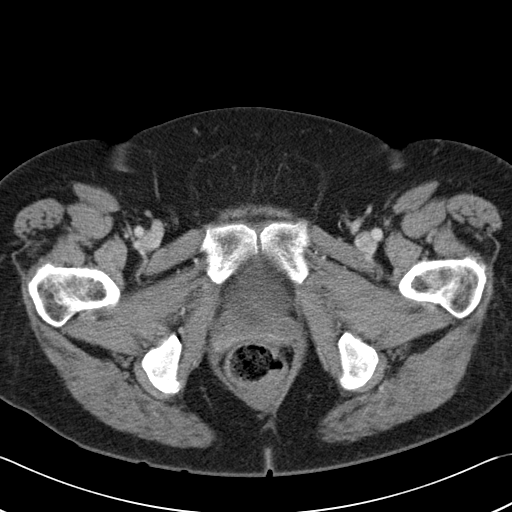
[im 5/89  bone]
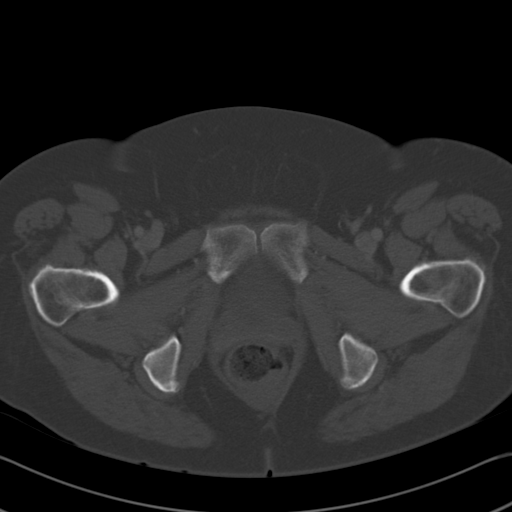
[im 10/89  soft-tissue]
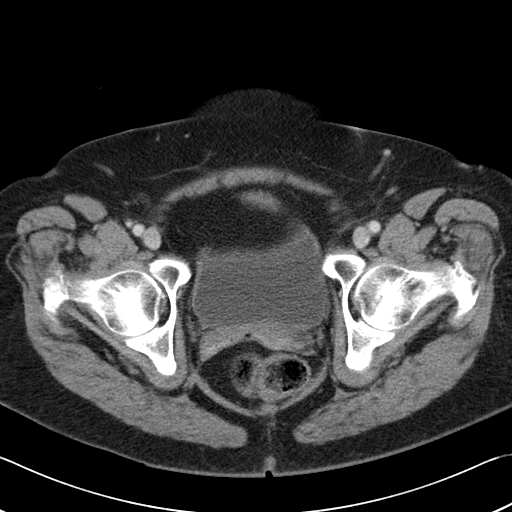
[im 19/89  soft-tissue]
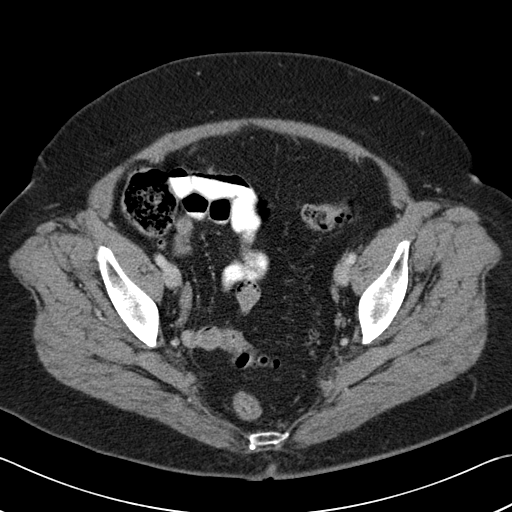
[im 24/89  soft-tissue]
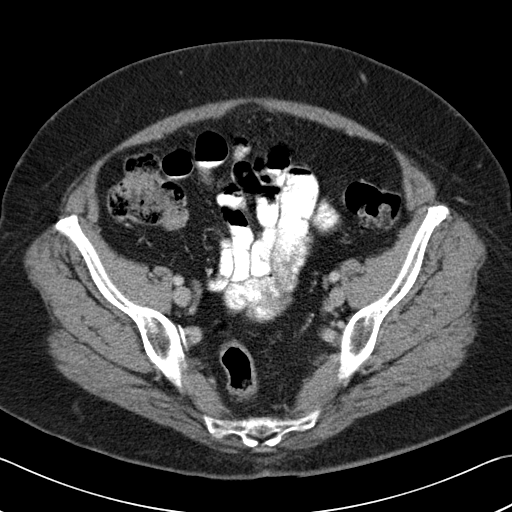
[im 28/89  soft-tissue]
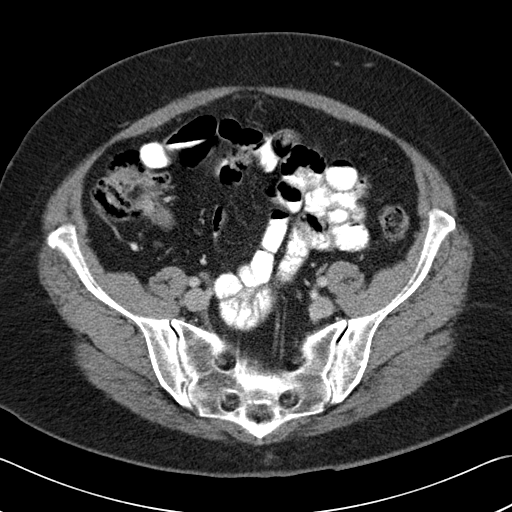
[im 38/89  soft-tissue]
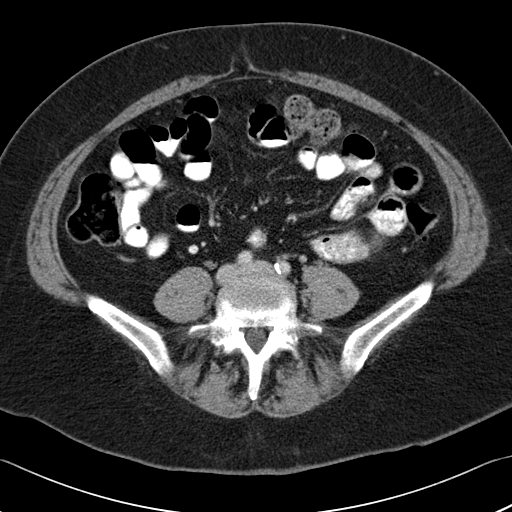
[im 42/89  soft-tissue]
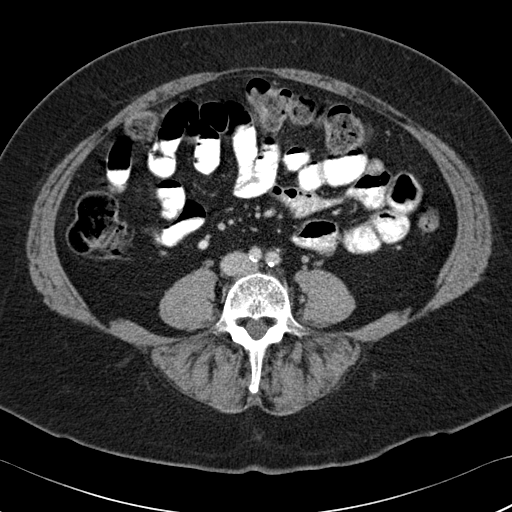
[im 47/89  soft-tissue]
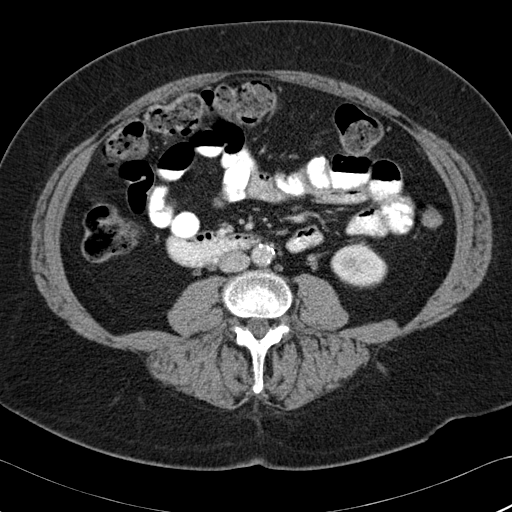
[im 51/89  soft-tissue]
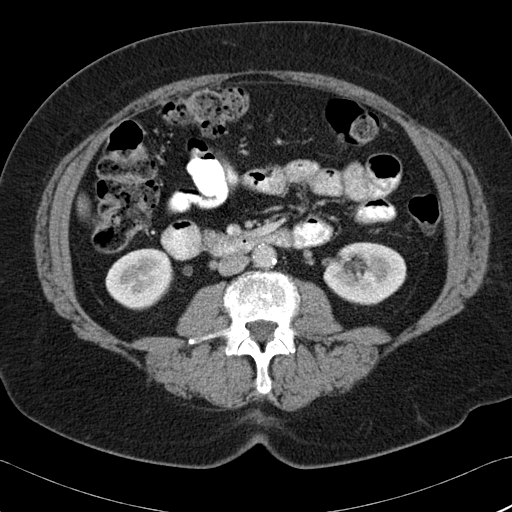
[im 51/89  bone]
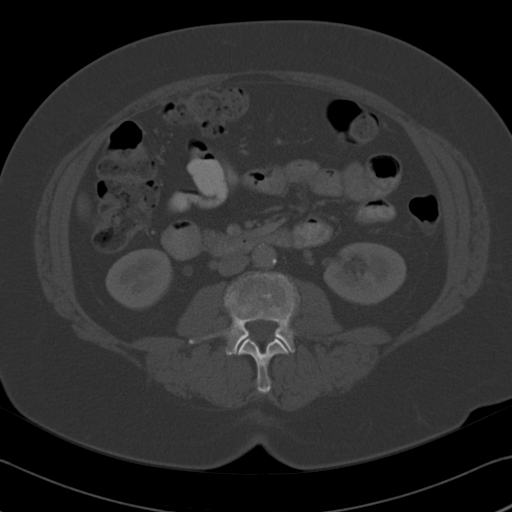
[im 61/89  soft-tissue]
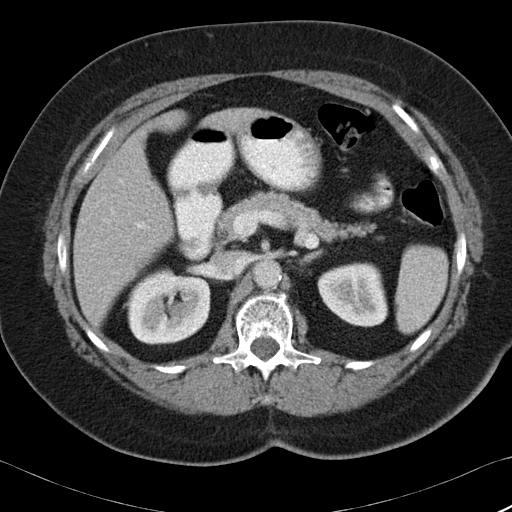
[im 65/89  soft-tissue]
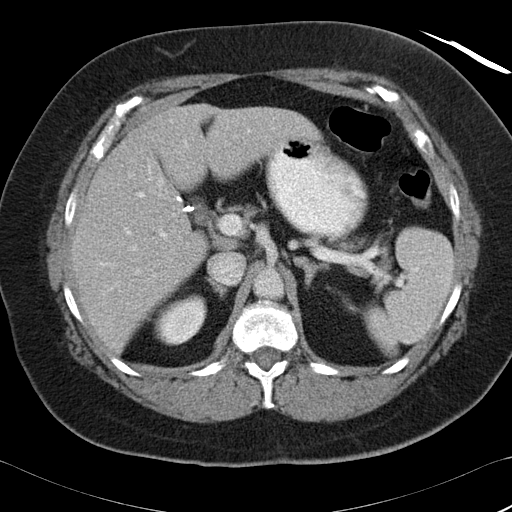
[im 70/89  soft-tissue]
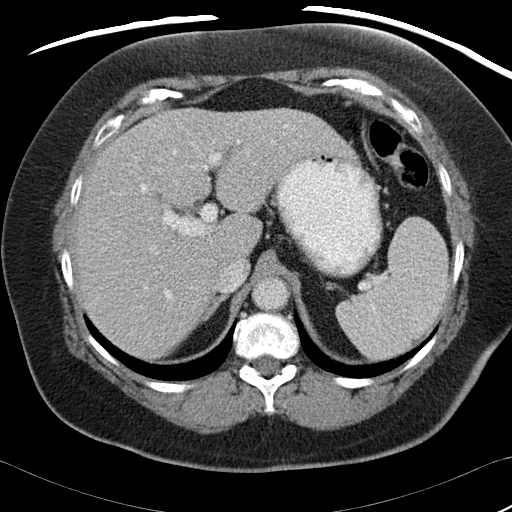
[im 79/89  soft-tissue]
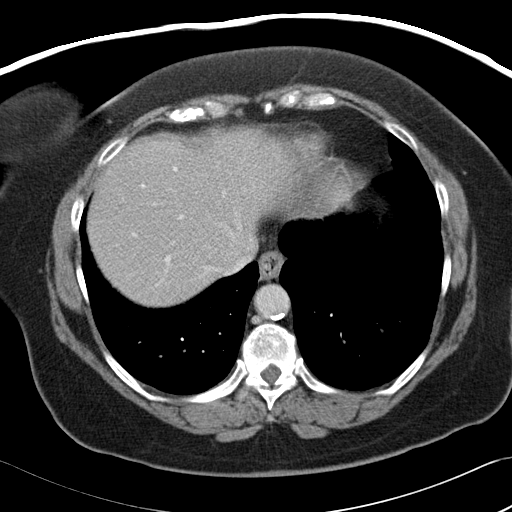
[im 84/89  soft-tissue]
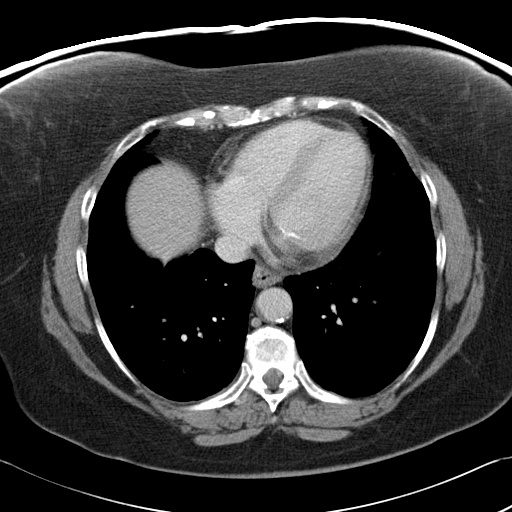

[Series 602: <mpr thick range> · coronal · 0.90mm/px · 3 of 82 slices shown]
[im 28/82  soft-tissue]
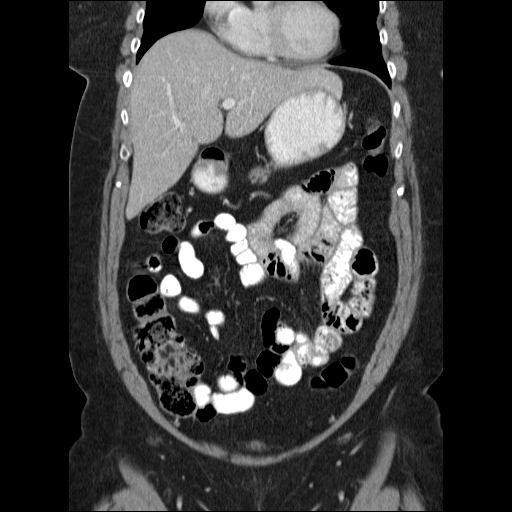
[im 37/82  soft-tissue]
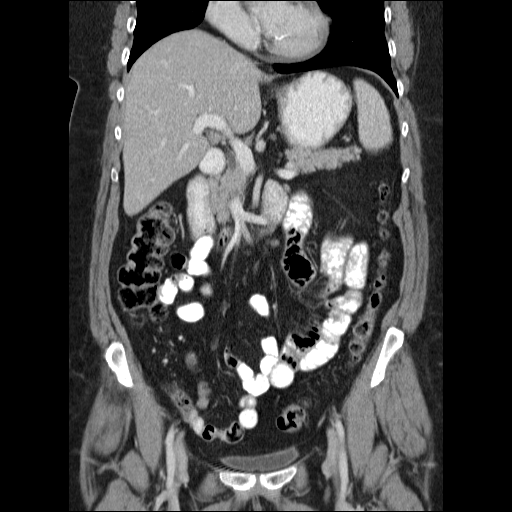
[im 46/82  soft-tissue]
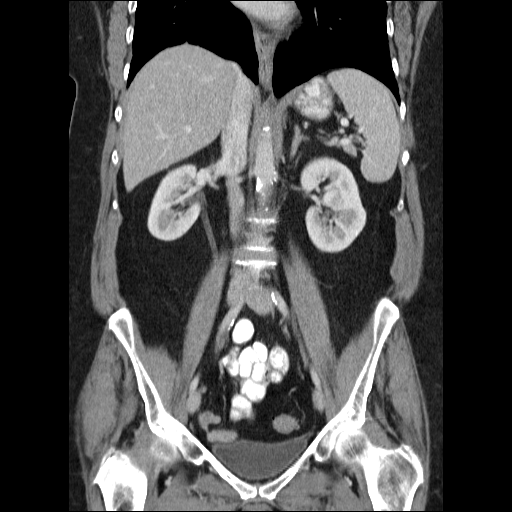

[17 of 46 positions shown; findings below may reference images not displayed]

FINDINGS: Similar patchy airspace disease in the inferior lingula,
likely atelectasis.

Normal heart size without pericardial or pleural effusion.  Normal
liver, spleen, stomach, pancreas, adrenal glands. Cholecystectomy
without biliary ductal dilatation.  Normal kidneys.

No retroperitoneal or retrocrural adenopathy.

Sigmoid diverticulosis.  Normal terminal ileum.  Normal appendix,
including on transverse image 69.

Normal small bowel without abdominal ascites.

  No pelvic adenopathy.    Normal urinary bladder.  Hysterectomy.
No adnexal mass.  No significant free fluid.  No acute osseous
abnormality.
IMPRESSION: 1. No acute process in the abdomen or pelvis.
2.  Normal appendix.

## 2011-02-01 LAB — CROSSMATCH
ABO/RH(D): O POS
Antibody Screen: NEGATIVE

## 2011-02-01 LAB — ABO/RH: ABO/RH(D): O POS

## 2011-02-03 LAB — COMPREHENSIVE METABOLIC PANEL
AST: 21 U/L (ref 0–37)
Albumin: 3.7 g/dL (ref 3.5–5.2)
Alkaline Phosphatase: 102 U/L (ref 39–117)
BUN: 10 mg/dL (ref 6–23)
GFR calc Af Amer: >60 mL/min (ref >60)
Potassium: 4.4 mEq/L (ref 3.5–5.1)
Sodium: 140 mEq/L (ref 135–145)
Total Protein: 6.7 g/dL (ref 6.0–8.3)

## 2011-02-03 LAB — CULTURE, RESPIRATORY W GRAM STAIN

## 2011-02-03 LAB — AFB CULTURE WITH SMEAR (NOT AT ARMC): Acid Fast Smear: NONE SEEN

## 2011-02-03 LAB — CBC
HCT: 42 % (ref 36.0–46.0)
Platelets: 254 10*3/uL (ref 150–400)
RDW: 13.4 % (ref 11.5–15.5)
WBC: 9.4 10*3/uL (ref 4.0–10.5)

## 2011-02-03 LAB — POCT I-STAT, CHEM 8
BUN: 13 mg/dL (ref 6–23)
Calcium, Ion: 1.14 mmol/L (ref 1.12–1.32)
Chloride: 108 mEq/L (ref 96–112)
Glucose, Bld: 133 mg/dL — ABNORMAL HIGH (ref 70–99)

## 2011-02-03 LAB — GLUCOSE, CAPILLARY: Glucose-Capillary: 145 mg/dL — ABNORMAL HIGH (ref 70–99)

## 2011-02-03 LAB — PROTIME-INR: INR: 1 (ref 0.00–1.49)

## 2011-02-03 LAB — FUNGUS CULTURE W SMEAR

## 2011-02-04 ENCOUNTER — Inpatient Hospital Stay (HOSPITAL_COMMUNITY)
Admission: EM | Admit: 2011-02-04 | Discharge: 2011-02-07 | DRG: 191 | Disposition: A | Discharge status: Home / Self Care | Payer: Medicaid Other | Source: Other Acute Inpatient Hospital | Attending: Internal Medicine | Admitting: Internal Medicine

## 2011-02-04 ENCOUNTER — Emergency Department (INDEPENDENT_AMBULATORY_CARE_PROVIDER_SITE_OTHER): Payer: Medicaid Other

## 2011-02-04 ENCOUNTER — Emergency Department (HOSPITAL_BASED_OUTPATIENT_CLINIC_OR_DEPARTMENT_OTHER)
Admission: EM | Admit: 2011-02-04 | Discharge: 2011-02-04 | Disposition: A | Discharge status: Other Acute Inpatient Hospital | Payer: Medicaid Other | Source: Home / Self Care | Attending: Emergency Medicine | Admitting: Emergency Medicine

## 2011-02-04 DIAGNOSIS — E7800 Pure hypercholesterolemia, unspecified: Secondary | ICD-10-CM | POA: Insufficient documentation

## 2011-02-04 DIAGNOSIS — Z9221 Personal history of antineoplastic chemotherapy: Secondary | ICD-10-CM

## 2011-02-04 DIAGNOSIS — R0789 Other chest pain: Secondary | ICD-10-CM | POA: Insufficient documentation

## 2011-02-04 DIAGNOSIS — Z79899 Other long term (current) drug therapy: Secondary | ICD-10-CM

## 2011-02-04 DIAGNOSIS — Z794 Long term (current) use of insulin: Secondary | ICD-10-CM

## 2011-02-04 DIAGNOSIS — J309 Allergic rhinitis, unspecified: Secondary | ICD-10-CM | POA: Diagnosis present

## 2011-02-04 DIAGNOSIS — E119 Type 2 diabetes mellitus without complications: Secondary | ICD-10-CM | POA: Diagnosis present

## 2011-02-04 DIAGNOSIS — Z23 Encounter for immunization: Secondary | ICD-10-CM

## 2011-02-04 DIAGNOSIS — Z87891 Personal history of nicotine dependence: Secondary | ICD-10-CM

## 2011-02-04 DIAGNOSIS — R079 Chest pain, unspecified: Secondary | ICD-10-CM

## 2011-02-04 DIAGNOSIS — J209 Acute bronchitis, unspecified: Principal | ICD-10-CM | POA: Diagnosis present

## 2011-02-04 DIAGNOSIS — E785 Hyperlipidemia, unspecified: Secondary | ICD-10-CM | POA: Diagnosis present

## 2011-02-04 DIAGNOSIS — J440 Chronic obstructive pulmonary disease with acute lower respiratory infection: Principal | ICD-10-CM | POA: Diagnosis present

## 2011-02-04 DIAGNOSIS — T380X5A Adverse effect of glucocorticoids and synthetic analogues, initial encounter: Secondary | ICD-10-CM | POA: Diagnosis not present

## 2011-02-04 DIAGNOSIS — Z923 Personal history of irradiation: Secondary | ICD-10-CM

## 2011-02-04 DIAGNOSIS — F17200 Nicotine dependence, unspecified, uncomplicated: Secondary | ICD-10-CM | POA: Insufficient documentation

## 2011-02-04 DIAGNOSIS — J9801 Acute bronchospasm: Secondary | ICD-10-CM | POA: Insufficient documentation

## 2011-02-04 DIAGNOSIS — D72829 Elevated white blood cell count, unspecified: Secondary | ICD-10-CM | POA: Diagnosis not present

## 2011-02-04 DIAGNOSIS — J90 Pleural effusion, not elsewhere classified: Secondary | ICD-10-CM | POA: Diagnosis present

## 2011-02-04 DIAGNOSIS — Z85118 Personal history of other malignant neoplasm of bronchus and lung: Secondary | ICD-10-CM

## 2011-02-04 LAB — D-DIMER, QUANTITATIVE: D-Dimer, Quant: 0.76 ug/mL-FEU — ABNORMAL HIGH (ref 0.00–0.48)

## 2011-02-04 LAB — COMPREHENSIVE METABOLIC PANEL
AST: 22 U/L (ref 0–37)
Albumin: 4.5 g/dL (ref 3.5–5.2)
BUN: 10 mg/dL (ref 6–23)
Creatinine, Ser: 0.7 mg/dL (ref 0.4–1.2)
GFR calc Af Amer: >60 mL/min (ref >60)
Potassium: 4.3 mEq/L (ref 3.5–5.1)
Total Protein: 8.3 g/dL (ref 6.0–8.3)

## 2011-02-04 LAB — DIFFERENTIAL
Eosinophils Absolute: 0.2 10*3/uL (ref 0.0–0.7)
Eosinophils Relative: 2 % (ref 0–5)
Lymphs Abs: 2.2 10*3/uL (ref 0.7–4.0)
Monocytes Absolute: 1.1 10*3/uL — ABNORMAL HIGH (ref 0.1–1.0)
Monocytes Relative: 12 % (ref 3–12)

## 2011-02-04 LAB — POCT CARDIAC MARKERS
CKMB, poc: 2.5 ng/mL (ref 1.0–8.0)
Myoglobin, poc: 112 ng/mL (ref 12–200)

## 2011-02-04 LAB — CBC
MCH: 30.6 pg (ref 26.0–34.0)
MCV: 89.3 fL (ref 78.0–100.0)
Platelets: 196 10*3/uL (ref 150–400)
RDW: 13.9 % (ref 11.5–15.5)

## 2011-02-04 MED ORDER — IOHEXOL 350 MG/ML SOLN
100.0000 mL | Freq: Once | INTRAVENOUS | Status: AC | PRN
  Administered 2011-02-04: 100 mL via INTRAVENOUS

## 2011-02-05 LAB — CARDIAC PANEL(CRET KIN+CKTOT+MB+TROPI)
CK, MB: 2.2 ng/mL (ref 0.3–4.0)
CK, MB: 3.3 ng/mL (ref 0.3–4.0)
Relative Index: 2.2 (ref 0.0–2.5)
Relative Index: 2.2 (ref 0.0–2.5)
Relative Index: 2 (ref 0.0–2.5)
Total CK: 100 U/L (ref 7–177)
Troponin I: <0.01 ng/mL (ref 0.00–0.06)
Troponin I: <0.01 ng/mL (ref 0.00–0.06)

## 2011-02-05 LAB — MAGNESIUM: Magnesium: 2.4 mg/dL (ref 1.5–2.5)

## 2011-02-05 LAB — LIPID PANEL: Cholesterol: 171 mg/dL (ref 0–200)

## 2011-02-05 LAB — COMPREHENSIVE METABOLIC PANEL
ALT: 16 U/L (ref 0–35)
AST: 21 U/L (ref 0–37)
Calcium: 9.3 mg/dL (ref 8.4–10.5)
GFR calc Af Amer: >60 mL/min (ref >60)
Glucose, Bld: 348 mg/dL — ABNORMAL HIGH (ref 70–99)
Sodium: 138 mEq/L (ref 135–145)
Total Protein: 7.1 g/dL (ref 6.0–8.3)

## 2011-02-05 LAB — CBC
MCHC: 32.9 g/dL (ref 30.0–36.0)
Platelets: 198 10*3/uL (ref 150–400)
RDW: 14.1 % (ref 11.5–15.5)

## 2011-02-05 LAB — T3, FREE: T3, Free: 3.1 pg/mL (ref 2.3–4.2)

## 2011-02-05 LAB — MRSA PCR SCREENING: MRSA by PCR: NEGATIVE

## 2011-02-05 LAB — GLUCOSE, CAPILLARY

## 2011-02-05 LAB — SEDIMENTATION RATE: Sed Rate: 40 mm/hr — ABNORMAL HIGH (ref 0–22)

## 2011-02-06 DIAGNOSIS — J90 Pleural effusion, not elsewhere classified: Secondary | ICD-10-CM

## 2011-02-06 LAB — CBC
HCT: 37.5 % (ref 36.0–46.0)
Hemoglobin: 12.5 g/dL (ref 12.0–15.0)
MCV: 92.4 fL (ref 78.0–100.0)
RBC: 4.06 MIL/uL (ref 3.87–5.11)
WBC: 12 10*3/uL — ABNORMAL HIGH (ref 4.0–10.5)

## 2011-02-06 LAB — GLUCOSE, CAPILLARY
Glucose-Capillary: 149 mg/dL — ABNORMAL HIGH (ref 70–99)
Glucose-Capillary: 144 mg/dL — ABNORMAL HIGH (ref 70–99)
Glucose-Capillary: 281 mg/dL — ABNORMAL HIGH (ref 70–99)

## 2011-02-06 LAB — BASIC METABOLIC PANEL
BUN: 15 mg/dL (ref 6–23)
Chloride: 105 mEq/L (ref 96–112)
Potassium: 4.2 mEq/L (ref 3.5–5.1)
Sodium: 138 mEq/L (ref 135–145)

## 2011-02-07 ENCOUNTER — Inpatient Hospital Stay (HOSPITAL_COMMUNITY): Payer: Medicaid Other

## 2011-02-07 LAB — GLUCOSE, CAPILLARY: Glucose-Capillary: 113 mg/dL — ABNORMAL HIGH (ref 70–99)

## 2011-02-07 LAB — PROCALCITONIN: Procalcitonin: <0.1 ng/mL

## 2011-02-08 NOTE — Discharge Summary (Signed)
[name], [name]               ACCOUNT NO.:  [id number]  MEDICAL RECORD NO.:  [id number]           PATIENT TYPE:  I  LOCATION:  4732                         FACILITY:  [facility]  PHYSICIAN:  Marinda Elk, M.D.DATE OF BIRTH:  May 11, 1946  DATE OF ADMISSION:  02/04/2011 DATE OF DISCHARGE:  02/07/2011                              DISCHARGE SUMMARY   PRIMARY CARE DOCTOR:  [name], MD  ONCOLOGIST:  [name], MD  DISCHARGE DIAGNOSIS: 1. Acute on chronic obstructive pulmonary disease exacerbation. 2. Diabetes mellitus. 3. Leukocytosis. 4. Loculated pleural effusion, unlikely infected.  DISCHARGE MEDICATIONS: 1. She will be on albuterol,Ventolin 2 puffs inhaled q.4 h q.12 h.     p.r.n. 2. Desonide 0.5 mg inhaled q.12 h. 3. Levaquin 500 mg tabs daily for 7 days. 4. Prednisone taper. 5. Spiriva 80 mcg daily. 6. Humulin 70/30, 20 units subcutaneous twice daily. 7. Loratadine 10 mg daily. 8. Simvastatin 10 mg daily.  PROCEDURES PERFORMED: 1. A chest x-ray that showed right upper lung density unchanged and     appears chronic. 2. CT angio of the chest shows no evidence of PE, similar previous     radiation changes within the right lung adjacent to the loculated     pleural effusion. 3. Chest x-ray showed no acute cardiopulmonary disease.  BRIEF ADMITTING HISTORY AND PHYSICAL:  This is a 65 year old female with past medical history of lung cancer, status post radiation and chemo, who has been experiencing some chest pain and shortness of breath over the last 2 days.  This patient's chest pain has been present at times of exertion, suprasternal, no radiation, lasting for the last 5 minutes, pressure-like, sometimes stabbing like in nature.  The shortness of breath is mainly at rest.  He also has increased on exertion and denies any nausea or vomiting, abdominal pain, diarrhea, dysuria.  Denies any fever, chills, or cough.  D-dimer was done that showed is  elevated.  A CT angio was done that showed no PE, very small pericardial effusion and some loculated effusion.  So, we are asked to admit and further evaluate.  Please refer to dictation.  PHYSICAL EXAM:  VITAL SIGNS:  Blood pressure 120/90, pulse of 120, temperature 97, respirations 18.  She was satting 100% on room air. HEENT: Anicteric.  No pallor.  No discharge from ear, nose or throat. LUNGS:  Good air movement, but expiratory wheezing bilateral. CARDIOVASCULAR:  She has a regular rate and rhythm with positive S1 and S2.  No murmurs, rubs or gallops. ABDOMEN:  Positive bowel sounds, nontender, nondistended, soft. EXTREMITIES:  Positive pulses.  No clubbing, cyanosis or edema. NEURO:  Awake, alert and oriented x4.  Coherent and fluent full language and nonfocal.  An EKG shows sinus tachycardia at 112 beats per minute, flow R progression.  CT angio as results above.  Labs on admission, white count 9.7, hemoglobin of 14, platelet count of 196,000, with an ANC of 13.  D-dimer was 0.76.  Sodium was 142, potassium 4.3, chloride 105, bicarb 24, glucose of 132, BUN of 10, creatinine 0.7, total bilirubin 0.8, alkaline phosphatase 144, AST  22, ALT 19, total protein 8.3, albumin 4.5, calcium 9.4, CK-MB 2.5. Troponin less than 0.05, myoglobin of 112.  HOSPITAL COURSE: 1. Acute COPD exacerbation.  The patient with a history of smoking.     Initially, she was started on IV fluids, IV antibiotics and     inhalers.  She improved very well.  Because of the concern of the     CT angio being infected.  CT Surgery was consulted which     recommended a prolactin level and a 2-D echo which will be done as     an outpatient.  They doubt this pleural space was infected.  They     agreed with treating her like an acute bronchitis/COPD     exacerbation.  A swallowing study was done that showed no     aspiration at all. 2. Diabetes type 2.  Her glucose was slightly increased on admission     and  slightly increased during her hospital stay secondary to     steroid.  We have been tapering down her steroids.  She will back     to her home dose. 3. Leukocytosis, this is probably secondary to steroids.  We will     follow up this as an outpatient. 4. Loculated infection.  CT Surgery was consulted.  They recommended     no further procedures that can leads to noneffective.  They agree     with inhalers and antibiotics to continue.  They also recommended     to get a 2-D echo which is pending at the time of this dictation     will be follow up by PCP.  DISPOSITION:  The patient is to follow up with Dr. Birdie Sons next week.  He follow up on results 2-D echo to evaluate very mild pericardial effusion she has.  We will also check her CBG to see how she is doing, by that time, she should be off her steroids, and her breathing she should be back to normal.  She will also follow up with Dr. Arbutus Ped on February 21, 2011, for a PET scan.  VITALS ON DAY OF DISCHARGE:  Temperature 97, pulse 67, respirations 20, blood pressure was 122/72, she was saturating on 99% on 2 liters.  LABS ON DAY OF DISCHARGE:  Shows her procalcitonin level less than 0.10. Her sodium is 138, potassium 4.2, chloride 105, bicarb 24, glucose 181, BUN of 13, creatinine 0.7, calcium 9.3.  Her white count of 12 with a hemoglobin of 12, platelet count of 204,000.     Marinda Elk, M.D.     AF/MEDQ  D:  02/07/2011  T:  02/08/2011  Job:  [id number]  cc:   [name], MD [name], M.D.  Electronically Signed by Lambert Keto M.D. on 02/08/2011 03:16:29 PM

## 2011-02-08 NOTE — Consult Note (Signed)
[name], [name]               ACCOUNT NO.:  [id number]  MEDICAL RECORD NO.:  [id number]           PATIENT TYPE:  I  LOCATION:  4732                         FACILITY:  [facility]  PHYSICIAN:  [name], M.D.  DATE OF BIRTH:  [birth year]  DATE OF CONSULTATION:  02/06/2011 DATE OF DISCHARGE:                                CONSULTATION   REQUESTING PHYSICIAN:  [facility].  REASON FOR CONSULTATION:  Possible loculated right pleural effusion - infection.  HISTORY OF PRESENT ILLNESS:  I was asked to evaluate this 65 year old Caucasian female, ex-smoker, who has been treated since 2010 and followed carefully by Dr. 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  She was treated with full-dose chemo - radiation and has had a good response.  Her last PET scan was almost 6 months ago and showed no evidence of recurrent disease. Clinically, she has done well without weight loss, pain and she is not on home oxygen.  Over the past 2 days, she has developed some chest tightness, wheezing, some shortness of breath and was evaluated in the emergency department.  She was found to be afebrile with a normal oxygen saturation, but chest and then CT scan were performed which showed some right-sided pleural thickening and a possible small loculated right pleural effusion.  There was concern whether this could be infected and a thoracic surgical evaluation was requested.  CT scan was done because of mild elevation in her D-dimer.  There was no evidence of pulmonary embolus.  A 2-D echo has been ordered, but has not yet been completed. There was no significant pericardial effusion noted on the CT scan of the chest.  Otherwise, her white count is normal at 9.7 and her arterial blood gases were unremarkable.  Cardiac enzymes were also negative and her EKG showed no abnormalities.  PAST MEDICAL HISTORY: 1. 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  Her last CAT     scan was almost 6 months ago, which was negative.  She does have     significant residual scarring and pleural thickening on the right     side from her previous treatment. 2. Reformed smoker. 3. Diabetes mellitus. 4. GERD.  HOME MEDICATIONS:  Insulin 70/30 and Zyrtec.  SOCIAL HISTORY:  The patient is married.  She does not smoke.  Her husband is on home oxygen, but the patient is not.  REVIEW OF SYSTEMS:  CONSTITUTIONAL:  Review is negative for fever or weight loss.  ENT:  Review is negative for symptoms of aspiration. CARDIAC:  Review is negative for history of coronary artery disease, arrhythmia, or murmur.  GI:  Review is negative for history of jaundice or blood per rectum.  She has no symptoms recent of UTI.  No symptoms of DVT, claudication or TIA.  Her diabetes been fairly well controlled.  PHYSICAL EXAMINATION:  VITAL SIGNS:  The patient is afebrile with a saturation of 97% on room air, blood pressure 120/80, pulse 88 and regular.  Her temperature  is 97.4. GENERAL APPEARANCE:  A middle-aged Caucasian female surrounded by some family in her hospital room, in no acute distress. HEENT:  Normocephalic. NECK:  Without mass, JVD or adenopathy. CHEST:  Breath sounds are slightly diminished on the right, but I hear no wheezing. CARDIAC:  Rhythm is regular without murmur or rub. ABDOMEN:  Soft, nontender. EXTREMITIES:  No tenderness or cyanosis. NEUROLOGIC:  Nonfocal.  LABORATORY DATA:  I reviewed her chest x-ray, which shows some mild volume loss on the right side, but no significant effusion.  I reviewed her CT scan, which shows the right lung parenchymal and pleural scarring from the radiation therapy, but no real change from her last CT scan in December of last year.  IMPRESSION AND PLAN:  I doubt that the patient has a significant pleural space infection or empyema, but we will  check a procalcitonin.  I agree with proceeding with a 2-D echo to check her pericardium for signs of pericarditis.  I also agree with a course of steroids and antibiotics for probable acute bronchitis.  A swallow study to rule out silent aspiration may also be helpful in defining the etiology of her clinical presentation.  However, most importantly a followup PET scan which has already been scheduled with Dr. Arbutus Ped later this month will give the best information regarding whether she could have any recurrent disease.     [name], M.D.     PV/MEDQ  D:  02/06/2011  T:  02/06/2011  Job:  [id number]  Electronically Signed by [name] M.D. on 02/08/2011 06:47:46 PM

## 2011-02-11 ENCOUNTER — Encounter: Payer: Self-pay | Admitting: Internal Medicine

## 2011-02-14 ENCOUNTER — Encounter: Payer: Self-pay | Admitting: Internal Medicine

## 2011-02-14 ENCOUNTER — Ambulatory Visit (INDEPENDENT_AMBULATORY_CARE_PROVIDER_SITE_OTHER): Payer: Medicaid Other | Admitting: Internal Medicine

## 2011-02-14 DIAGNOSIS — E119 Type 2 diabetes mellitus without complications: Secondary | ICD-10-CM

## 2011-02-14 DIAGNOSIS — E785 Hyperlipidemia, unspecified: Secondary | ICD-10-CM

## 2011-02-14 DIAGNOSIS — C3490 Malignant neoplasm of unspecified part of unspecified bronchus or lung: Secondary | ICD-10-CM

## 2011-02-14 DIAGNOSIS — J449 Chronic obstructive pulmonary disease, unspecified: Secondary | ICD-10-CM

## 2011-02-14 NOTE — Assessment & Plan Note (Signed)
Reviewed current insulin Continue same

## 2011-02-14 NOTE — Progress Notes (Signed)
  Subjective:    Patient ID: [name], female    DOB: [birth year], 65 y.o.   MRN: [id number]  HPI Pt comes in for f/u of multiple medicl problems She was recently hospitalized--i have reviewed those records, imaging studies and labs with patient and spouse.   DM 2---tolerating meds, no sxs  LIpids: toleating meds  Lung ca: has f/u with dr. Arbutus Ped  Past Medical History  Diagnosis Date  . Diabetes mellitus   . Hyperlipidemia   . GERD (gastroesophageal reflux disease)   . Lung cancer 2010   Past Surgical History  Procedure Date  . Appendectomy   . Abdominal hysterectomy 1997  . Carpal tunnel release     reports that she quit smoking about 2 years ago. Her smoking use included Cigarettes. She does not have any smokeless tobacco history on file. Her alcohol and drug histories not on file. family history includes Diabetes in her brother, father, and mother; Heart disease in her brother; Heart failure in her mother; and Kidney disease in her mother. Allergies  Allergen Reactions  . Ibuprofen     REACTION: breathing     Review of Systems  patient denies chest pain, shortness of breath, orthopnea. Denies lower extremity edema, abdominal pain, change in appetite, change in bowel movements. Patient denies rashes, musculoskeletal complaints. No other specific complaints in a complete review of systems.      Objective:   Physical Exam  Well-developed well-nourished female in no acute distress. HEENT exam atraumatic, normocephalic, extraocular muscles are intact. Neck is supple. No jugular venous distention no thyromegaly. Chest clear to auscultation without increased work of breathing. Cardiac exam S1 and S2 are regular. Abdominal exam active bowel sounds, soft, nontender, overweight. . Extremities no edema. Neurologic exam she is alert without any motor sensory deficits. Gait is normal.        Assessment & Plan:

## 2011-02-14 NOTE — Assessment & Plan Note (Signed)
Recent exacerbation Reviewed hospital note and CT scan She is mch improved Still on prednisone. --she is on a taper and then will quit

## 2011-02-14 NOTE — Assessment & Plan Note (Signed)
Needs f/u Check labs Continue meds

## 2011-02-21 ENCOUNTER — Other Ambulatory Visit: Payer: Self-pay | Admitting: Internal Medicine

## 2011-02-21 ENCOUNTER — Encounter (HOSPITAL_BASED_OUTPATIENT_CLINIC_OR_DEPARTMENT_OTHER): Payer: Medicaid Other | Admitting: Internal Medicine

## 2011-02-21 ENCOUNTER — Ambulatory Visit (HOSPITAL_COMMUNITY)
Admission: RE | Admit: 2011-02-21 | Discharge: 2011-02-21 | Disposition: A | Discharge status: Home / Self Care | Payer: Medicaid Other | Source: Ambulatory Visit | Attending: Internal Medicine | Admitting: Internal Medicine

## 2011-02-21 ENCOUNTER — Encounter (HOSPITAL_COMMUNITY): Payer: Self-pay

## 2011-02-21 DIAGNOSIS — Z9221 Personal history of antineoplastic chemotherapy: Secondary | ICD-10-CM | POA: Insufficient documentation

## 2011-02-21 DIAGNOSIS — R059 Cough, unspecified: Secondary | ICD-10-CM | POA: Insufficient documentation

## 2011-02-21 DIAGNOSIS — C3490 Malignant neoplasm of unspecified part of unspecified bronchus or lung: Secondary | ICD-10-CM

## 2011-02-21 DIAGNOSIS — Z923 Personal history of irradiation: Secondary | ICD-10-CM | POA: Insufficient documentation

## 2011-02-21 DIAGNOSIS — R05 Cough: Secondary | ICD-10-CM | POA: Insufficient documentation

## 2011-02-21 DIAGNOSIS — R0602 Shortness of breath: Secondary | ICD-10-CM | POA: Insufficient documentation

## 2011-02-21 DIAGNOSIS — Z85118 Personal history of other malignant neoplasm of bronchus and lung: Secondary | ICD-10-CM | POA: Insufficient documentation

## 2011-02-21 DIAGNOSIS — C3410 Malignant neoplasm of upper lobe, unspecified bronchus or lung: Secondary | ICD-10-CM

## 2011-02-21 DIAGNOSIS — C7931 Secondary malignant neoplasm of brain: Secondary | ICD-10-CM

## 2011-02-21 LAB — CBC WITH DIFFERENTIAL/PLATELET
BASO%: 0.3 % (ref 0.0–2.0)
EOS%: 1.8 % (ref 0.0–7.0)
HGB: 13.6 g/dL (ref 11.6–15.9)
MCH: 31.7 pg (ref 25.1–34.0)
MCHC: 34.5 g/dL (ref 31.5–36.0)
MCV: 92.1 fL (ref 79.5–101.0)
MONO%: 8.8 % (ref 0.0–14.0)
RBC: 4.28 10*6/uL (ref 3.70–5.45)
RDW: 14.8 % — ABNORMAL HIGH (ref 11.2–14.5)
lymph#: 1 10*3/uL (ref 0.9–3.3)

## 2011-02-21 LAB — CMP (CANCER CENTER ONLY)
ALT(SGPT): 20 U/L (ref 10–47)
AST: 16 U/L (ref 11–38)
Albumin: 3.2 g/dL — ABNORMAL LOW (ref 3.3–5.5)
Alkaline Phosphatase: 88 U/L — ABNORMAL HIGH (ref 26–84)
Calcium: 8.6 mg/dL (ref 8.0–10.3)
Chloride: 101 mEq/L (ref 98–108)
Potassium: 4.2 mEq/L (ref 3.3–4.7)

## 2011-02-21 MED ORDER — IOHEXOL 300 MG/ML  SOLN
80.0000 mL | Freq: Once | INTRAMUSCULAR | Status: AC | PRN
  Administered 2011-02-21: 80 mL via INTRAVENOUS

## 2011-02-23 ENCOUNTER — Encounter (HOSPITAL_BASED_OUTPATIENT_CLINIC_OR_DEPARTMENT_OTHER): Payer: Self-pay | Admitting: Internal Medicine

## 2011-02-23 ENCOUNTER — Other Ambulatory Visit: Payer: Self-pay | Admitting: Internal Medicine

## 2011-02-23 DIAGNOSIS — C7931 Secondary malignant neoplasm of brain: Secondary | ICD-10-CM

## 2011-02-23 DIAGNOSIS — C3490 Malignant neoplasm of unspecified part of unspecified bronchus or lung: Secondary | ICD-10-CM

## 2011-02-23 DIAGNOSIS — C3410 Malignant neoplasm of upper lobe, unspecified bronchus or lung: Secondary | ICD-10-CM

## 2011-03-06 NOTE — H&P (Signed)
NAMESHEMEIKA, STARZYK               ACCOUNT NO.:  [id number]  MEDICAL RECORD NO.:  [id number]           PATIENT TYPE:  I  LOCATION:  3304                         FACILITY:  [facility]  PHYSICIAN:  Eduard Clos, MDDATE OF BIRTH:  Mar 31, 1946  DATE OF ADMISSION:  02/04/2011 DATE OF DISCHARGE:                             HISTORY & PHYSICAL   The patient's primary care physician used to be Dr. Birdie Sons which has changed now.  The patient states that she goes to another doctor. She is not able to recall the name at this time.  CHIEF COMPLAINT:  Chest pain and shortness of breath.  HISTORY OF PRESENT ILLNESS:  A 65 year old female with known history of CA of lung status post radiation and chemotherapy, the last one as per the patient was in March 2010, has been experiencing some chest pain and shortness of breath over the last 2 days.  The chest pain is present at rest, sometimes with exertion, suprasternal, nonradiating, lasts for less than 5 minutes, pressure like, sometimes stabbing in nature.  The shortness of breath is mainly at rest, also increased by exertion. Denies any nausea, vomiting, abdominal pain, dysuria, discharge, or diarrhea.  Denies any fever, chill.  Has cough, nonproductive.  In the ER, the patient was found to have mildly elevated D-dimer.  CT angio of chest was done which at this time shows no pulmonary embolism but does show radiation changes and also mild pericardial effusion and some loculated pleural fluid.  At this time, the patient has been admitted for shortness of breath and chest pain.  The patient is chest pain-free at this time.  The patient on exam does have some expiratory wheezes.  The patient denies any dizziness, loss of consciousness, or any focal deficit.  Denies any dysuria, discharge, or diarrhea.  PAST MEDICAL HISTORY:  History of CA of lung status post radiation and chemo, the last one as per the patient was in March 2010.  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 We have no further records on that in the e-chart.  History of diabetes mellitus type 2, previous history of cigarette smoking which she quit 2 years ago.  PAST SURGICAL HISTORY:  The patient has had a hysterectomy and carpal tunnel release surgery.  MEDICATIONS PRIOR TO ADMISSION:  The patient states she takes Zyrtec and insulin 70/30, usually 20 in the morning and 25 units in the evening.  ALLERGIES:  IBUPROFEN.  SOCIAL HISTORY:  The patient quit smoking 2 years ago.  Denies any alcohol or drug abuse.  Lives with her husband.  She is a full code.  REVIEW OF SYSTEMS:  As per history of present illness, nothing else significant.  FAMILY HISTORY:  Nothing contributory.  PHYSICAL EXAMINATION:  GENERAL:  The patient was examined at bedside, not in acute distress.  Presently, she is chest pain-free. VITAL SIGNS:  Blood pressure 120/90, pulse 120 per minute, temperature 97.4, respirations 18 per minute, and O2 saturation is 100%. HEENT:  Anicteric.  No pallor.  No discharge from ears, eyes, nose, or mouth. CHEST:  Bilateral air entry  present.  Bilateral expiratory wheeze.  No crepitation. HEART:  S1 and S2 heard. ABDOMEN:  Soft and nontender.  Bowel sounds heard. CNS:  The patient is alert and oriented to time, place, and person. Moves upper and lower extremities 5/5. EXTREMITIES:  Peripheral pulses are felt.  No edema.  LABORATORY DATA:  EKG shows sinus tachycardia, beats around 112 beats per minute with poor R-wave progression with nonspecific ST-T changes and also PR changes.  CT angio of chest shows no evidence for acute pulmonary embolism, similar appearance of radiation change within right lung with adjacent loculated pleural fluid.  Chest x-ray shows no acute cardiopulmonary abnormality, interval improvement in paramediastinal radiation change within the right upper lobe.  CBC; WBC 9.7,  hemoglobin is 14.6, hematocrit is 42.6, platelets 196, and neutrophils 64%.  PT/INR is 13.6 and 1.  D-dimer is 0.76.  Complete metabolic panel; sodium 142, potassium 4.3, chloride 105, carbon dioxide 24, glucose 122, BUN 10, and creatinine 0.7.  Total bilirubin is 0.8, alkaline phos 144, AST 22, ALT 19, total protein 8.3, albumin 4.5, and calcium 9.4.  CK-MB is 2.5, troponin less than 0.05, and myoglobin 112.  ASSESSMENT: 1. Shortness of breath with chest pain.  We will rule out acute     coronary syndrome. 2. Possible chronic obstructive pulmonary disease exacerbation. 3. Loculated right-sided pleural effusion with known history of     carcinoma of lung. 4. Diabetes mellitus type 2. 5. Previous history of cigarette smoking. 6. History of allergic rhinitis.  PLAN: 1. At this time, we will admit the patient to Step-Down Unit. 2. For her shortness of breath, at this time, her shortness of breath     most likely could be from COPD but I am going to check BNP.  CAT     scan also shows trace pericardial effusion.  The patient does not     have any signs of tamponade.  We will get a 2-D echo.  We will     cycle cardiac markers as the patient does have some chest pain.     The patient will be on aspirin.  We will place the patient on     nebulizer and antibiotics. 3. For her right-sided loculated pleural effusion, I think we need to     get an opinion from Cardiothoracic Surgery later.  At this time, I     am going to empirically place the patient on vancomycin and     Levaquin until we get consult or opinion from Cardiothoracic     Surgery. 4. Chest pain.  We are going to cycle cardiac markers.  We will get 2-     D echo and the patient will be on aspirin and p.r.n. nitroglycerin. 5. The patient does have some tachycardia.  I will place the patient     on Xopenex and stop Albuterol and as stated earlier, we are going     to get 2-D echo and also we will check TSH, free T3 and T4. 6.  Diabetes.  At this time, we are going to place the patient on     Lantus insulin 15 units.  The patient usually takes     NovoLog 70/30, 20 in the morning and 25 at night.  At this time,     the patient will be on sliding scale coverage.  We will check     hemoglobin A1c. 7. Further recommendations based on clinical course and tests ordered.     Meryle Ready  Toniann Fail, MD     ANK/MEDQ  D:  02/05/2011  T:  02/05/2011  Job:  [id number]  Electronically Signed by Midge Minium MD on 03/06/2011 08:09:33 AM

## 2011-03-08 NOTE — Op Note (Signed)
NAMELIZ, PINHO               ACCOUNT NO.:  [id number]   MEDICAL RECORD NO.:  [id number]          PATIENT TYPE:  AMB   LOCATION:  SDS                          FACILITY:  [facility]   PHYSICIAN:  Leslye Peer, MD    DATE OF BIRTH:  June 25, 1946   DATE OF PROCEDURE:  01/08/2009  DATE OF DISCHARGE:  01/08/2009                               OPERATIVE REPORT   PROCEDURE:  Fiberoptic bronchoscopy and endobronchial ultrasound with  Wang needle biopsies, endobronchial biopsies, and endobronchial  brushings.   OPERATORS:  1. Leslye Peer, MD  2. Ines Bloomer, MD   INDICATIONS:  Right upper lobe mass.  Informed consent was performed and  a signed copy is on her hospital chart.   ANESTHESIA:  Sedation was with general anesthesia and LMA airway.   PROCEDURE IN DETAIL:  After informed consent was obtained, the patient  was placed under general anesthesia and an LMA was inserted per  ventilation.  A standard fiberoptic bronchoscope was introduced through  the LMA for general airway inspection.  The left side exam was  completely normal.  The right side exam showed some narrowing of the  orifice to the right upper lobe without any obvious endobronchial lesion  or secretions.  There was some external compression of the right  mainstem bronchus just proximal to the right upper lobe takeoff from  presumed lymphadenopathy.  All airways were inspected including the  right upper lobe, right middle lobe, and right lower lobe airways.  There were no endobronchial lesions.  Likewise, the left side exam  included the left upper lobe, lingular, and left lower lobe airways.  Again, no endobronchial lesions or secretions were noted.  Endobronchial  biopsies were performed from the right upper lobe segmental carina and  will be sent for pathology.  Endobronchial brushings were performed from  2 subsegments of the right upper lobe, the apical and anterior and will  be sent for cytology.  A blind  Wang needle biopsy was performed in the  right mainstem bronchus just proximal to the right upper lobe takeoff to  be sent for cytology.  The standard bronchoscope was then withdrawn and  endobronchial ultrasound was introduced under real-time ultrasound  guidance.  Several transbronchial Wang needle biopsies were performed in  the 4R lymph nodes.  These will be sent for cytopathology.  There was no  obvious bleeding.  There were no obvious complications.  The patient  tolerated the procedure well and returned to the recovery room in good  condition.   SPECIMENS:  1. Endobronchial biopsy from the right upper lobe.  2. Endobronchial brushings from the right upper lobe apical and      anterior subsegments.  3. Wang needle biopsy from the right mainstem bronchus.  4. Ultrasound-guided Wang needle biopsies from the 4R lymph node.   PLAN:  Initial cytology on Ms. Garner Nash' Wang needle biopsies revealed  probable small cell cancer.  This will be confirmed on final pathology  and results will be reviewed with her when they become available.  She  has already had a followup established  in [facility] next week to plan  her next steps in her care.      Leslye Peer, MD  Electronically Signed     RSB/MEDQ  D:  01/08/2009  T:  01/09/2009  Job:  [id number]   cc:   Charlcie Cradle. Delford Field, MD, FCCP

## 2011-03-08 NOTE — Assessment & Plan Note (Signed)
[facility]                            CARDIOLOGY OFFICE NOTE   NAME:[name], [name]                      MRN:          [id number]  DATE:08/03/2007                            DOB:          August 29, 1946    PRIMARY CARDIOLOGIST:  Dr. Shawnie Pons.   I was asked as the physician of the day to review [name]' exercise  echocardiogram and speak with her.  Based on available information, she  was just recently seen in the hospital, admitted to the Internal  Medicine Service with atypical chest pain, and was seen in consultation  by Dr. Gala Romney.  She ruled out for myocardial infarction, and was  scheduled for a followup exercise echocardiogram today.  I see that her  prior cardiac history includes previous cardiac catheterization  following a mildly abnormal Myoview in September 2006.  Her cardiac  catheterization was done in October of that year, and demonstrated mild  coronary atherosclerosis with no flow-limiting lesions, and a normal  ejection fraction.   [name] underwent her exercise echocardiogram today, and was noted  to have no evidence of chest pain during the study.  She achieved 84% of  her maximum predicted heart rate, and exercised to a workload of 6.9  METS.  She had no diagnostic ST segment changes at that time, and her  echocardiographic images demonstrated no obvious inducible wall motion  abnormalities to suggest ischemia.  She described, in recovery, a  feeling of perioral numbness, and had a blood glucose checked, which was  within normal range, and also a pulse oximetry checked at 94% on room  air.  I spoke with her after she was brought back to an examination  room, and she stated that she was feeling much better without residual  symptoms.  I wonder if she simply hyperventilated.  I reviewed the  results of the stress test with her, and asked her to keep her followup  appointment with Dr. [name] that was already arranged.   The formal  exercise echocardiographic report is reported in full elsewhere.     [name], MD  Electronically Signed    SGM/MedQ  DD: 08/03/2007  DT: 08/04/2007  Job #: [id number]   cc:   [name], MD, [facility]  Bruce H. Swords, MD

## 2011-03-08 NOTE — Discharge Summary (Signed)
[name], [name]               ACCOUNT NO.:  [id number]   MEDICAL RECORD NO.:  [id number]          PATIENT TYPE:  OBV   LOCATION:  4733                         FACILITY:  [facility]   PHYSICIAN:  Valerie A. Felicity Coyer, MDDATE OF BIRTH:  Jan 17, 1946   DATE OF ADMISSION:  07/30/2007  DATE OF DISCHARGE:  07/31/2007                               DISCHARGE SUMMARY   DISCHARGE DIAGNOSES:  1. Chest pain, plan for outpatient Cardiolite per cardiology.  2. Diabetes type 2.  3. Hyperlipidemia.  4. Ongoing tobacco abuse   HISTORY OF PRESENT ILLNESS:  [name] is a 65 year old female who was  admitted on July 30, 2007 with chief complaint of chest pain.  She  awoke with chest pain at 2:45 a.m. on Monday, July 30, 2007.  It was a  squeezing chest pain which she rated a 10/10.  EMS was called, and she  received a baby aspirin x4.  She apparently had a stress test in 2006  with an abnormal result which led to a cardiac cath.  She was admitted  for further evaluation and treatment.   PAST MEDICAL HISTORY:  1. Status post cardiac cath in 2006 with normal left ventricular      ejection fraction, LAD 30-40%, diagonal 1 40%, RCA 20%.  2. Diabetes type 2.  3. Hyperlipidemia.  4. Ongoing tobacco abuse.  5. Obesity.  6. GERD.   HOSPITAL COURSE:  Problem 1.  Chest pain.  The patient was admitted.  Serial cardiac enzymes were negative.  She was seen in consultation by  [facility].  Due to multiple risk factors, they recommended an  outpatient stress test which was scheduled for August 02, 2007.  The  patient's symptoms resolved, and she was discharged to home.   DISCHARGE MEDICATIONS:  1. Combivent 2 puffs every 6 hours as needed.  2. Glyburide 5 mg p.o. daily.  3. Insulin 70/30, 15 units in the morning and 10 units in the evening.  4. Pravastatin 40 mg p.o. daily.  5. Enteric-coated aspirin 81 mg p.o. daily.   DISPOSITION:  Discharge the patient to home.   FOLLOWUP:  The patient is  instructed to follow up with Dr. Cato Mulligan in 1-2  weeks and contact the office for an appointment and to follow up with  Dr. [name] on September 07, 2007 at 10:45 a.m.   PERTINENT LABORATORY DATA AT DISCHARGE:  BUN 10.  Hemoglobin 14.6.  Cardiac enzymes negative x3.  Cholesterol 164, LDL 100, HDL 125.      Rachael Craze, NP      Raenette Rover. Felicity Coyer, MD  Electronically Signed    MO/MEDQ  D:  09/03/2007  T:  09/04/2007  Job:  [id number]

## 2011-03-08 NOTE — H&P (Signed)
[name], [name]               ACCOUNT NO.:  [id number]   MEDICAL RECORD NO.:  [id number]          PATIENT TYPE:  EMS   LOCATION:  MAJO                         FACILITY:  [facility]   PHYSICIAN:  Valerie A. Felicity Coyer, MDDATE OF BIRTH:  Dec 24, 1945   DATE OF ADMISSION:  07/30/2007  DATE OF DISCHARGE:                              HISTORY & PHYSICAL   PRIMARY CARE PHYSICIAN:  [name], M.D.   CHIEF COMPLAINT:  Chest pain.   HISTORY OF PRESENT ILLNESS:  The patient is a 65 year old white female  with past medical history of diabetes mellitus, tobacco abuse, and  hyperlipidemia, who presents to the emergency room after waking up with  an episode of chest pain.  She says she has been previously well.  She  has not had any chest pain for years.  She had a stress test years ago  that was negative, but then has been fine ever since.  Then, this  morning in the middle of the night, she woke up around 3 a.m. with a  midsternal to midepigastric chest pain.  It is described as a squeezing  sensation as well as a tightness.  She also felt like it was pulling.  It was quite severe, like 8 or 9.  It caught her breath, although did  not make her short of breath, and after a few minutes subsided and she  thought she was able to go back to sleep.  Then, she had a second  episode.  This one concerned her even more and would not go away, and so  she called her husband, and she came over to the emergency room.  In the  emergency room, she had a chest x-ray done which was unremarkable, an  EKG done which showed normal sinus rhythm.  Her blood pressure on  admission was 102/62, and labs including cardiac markers were checked,  all of which were unremarkable.  The patient has numerous risk factors,  including age, cholesterol, diabetes, and also smokes as well, so it was  felt best that she come in for rule out.  Currently, the patient is  doing well.  She denies any headaches, vision changes, or  dysphagia.  She does complain of some mild chest tightness right now, but it is  easing up.  No shortness of breath, no wheezing or coughing, no  abdominal pain, no hematuria or dysuria, constipation, diarrhea, focal  extremity numbness, weakness, or pain.  Review of systems otherwise  negative.   PAST MEDICAL HISTORY:  1. Tobacco abuse.  2. Hyperlipidemia.  3. Diabetes mellitus.   MEDICATIONS:  1. Glyburide.  2. Insulin 70/30, 15 units in the morning, 10 units in the evening.  3. Pravastatin.   ALLERGIES:  She is allergic to IBUPROFEN.   SOCIAL HISTORY:  She smokes about 1/2 pack a day.  She used to smoke  more than that.  Denies any alcohol or drug use.   FAMILY HISTORY:  Notable for a sister who had an MI in her late 41s.   PHYSICAL EXAMINATION:  VITAL SIGNS:  Temperature 97, heart rate 77,  blood pressure 102/62, respirations 24, O2 saturation 96% on room air.  GENERAL:  She is alert and oriented x3.  No apparent distress.  HEENT:  Normocephalic, atraumatic.  Her mucous membranes are moist.  NECK:  She has no carotid bruits.  HEART:  Regular rate and rhythm.  S1, S2.  LUNGS:  Clear to auscultation bilaterally.  ABDOMEN:  Soft, nontender, nondistended.  Positive bowel sounds.  EXTREMITIES:  Show no clubbing, cyanosis, or edema.   LABORATORY WORK:  Chest x-ray and EKG are as per HPI.  White count 8.6,  H&H 14.6 and 42, MCV 92, platelet count 229, no shift.  Sodium 140,  potassium 3.6, chloride 108, bicarbonate 24, BUN 10, creatinine 0.7,  glucose 100.  CPK 77.5, MB 1.3, troponin I less than 0.05.  D-dimer 0.3.   ASSESSMENT AND PLAN:  1. Chest pain in a patient with multiple risk factors.  Will check      serial sets of cardiac enzymes, and if negative we will check a      stress test to rule out cardiac in nature.  2. Diabetes mellitus.  Continue insulin plus sliding scale.  Check a      hemoglobin A1c.  3. Hyperlipidemia.  Check a fasting lipid profile.  4. Obesity.   5. Tobacco abuse.  The patient declined nicotine patch.      Hollice Espy, M.D.  Electronically Signed      Raenette Rover. Felicity Coyer, MD  Electronically Signed    SKK/MEDQ  D:  07/30/2007  T:  07/30/2007  Job:  [id number]   cc:   [name], MD

## 2011-03-08 NOTE — Assessment & Plan Note (Signed)
[facility]                            CARDIOLOGY OFFICE NOTE   NAME:Burgeson, TOSHIBA NULL                      MRN:          [id number]  DATE:09/07/2007                            DOB:          1946/07/31    Danijela is in for followup.  She was admitted to the hospital with some  chest pain.  She unfortunately is a smoker, has type 2 diabetes, and  hyperlipidemia.  She had fairly significant pain.  Her cardiac enzymes  turned out to be negative.  She was subsequently discharged from the  hospital.  Her chest x-ray at that time was normal as well.  Her lipid  studies revealed an LDL of 100.   She subsequently underwent stress echocardiography.  The stress  echocardiogram study did not demonstrate significant ischemia.  Her  stress duration was 4 minutes and 58 seconds with a maximum of 136 or  84% of age predicted maximum.  She had a hypertensive response to  exercise.  She did have some dizziness and lip numbness with exercise.  At maximum stress __________  quality was adequate and with exercise  there was no evidence of stress-induced ischemia and a heart rate of 84%  of age-predicted maximum.   Since discharge from the hospital, she has cut down on her smoking.  She  now smokes a carton every other week.   MEDICATIONS:  1. Omeprazole 20 mg daily.  2. Aspirin 81 mg daily.  3. Insulin 70/30, 10 units in the p.m. and 15 units in the a.m.   PHYSICAL EXAMINATION:  VITAL SIGNS:  Her blood pressure is 124/70.  Pulse is 96.  LUNGS:  The lung fields are clear.  CARDIAC:  Rhythm is regular.   This patient is stable.  She is doing well.  I have spoken with her at  great length today both about smoking, her diabetes, and her  hyperlipidemia.  She does not have inducible ischemia at the present  time.  She is scheduled to see Dr. Cato Mulligan at the end of the month.  He  will likely reinforce those issues with her.  Given her diabetes and her  risks, she may be a  candidate for lipid-lowering therapy at his  discretion.     [name], MD, [facility]  Electronically Signed    TDS/MedQ  DD: 09/07/2007  DT: 09/07/2007  Job #: [id number]   cc:   [name], MD

## 2011-03-08 NOTE — Consult Note (Signed)
[name], [name]               ACCOUNT NO.:  [id number]   MEDICAL RECORD NO.:  [id number]          PATIENT TYPE:  OBV   LOCATION:  4733                         FACILITY:  [facility]   PHYSICIAN:  Bevelyn Buckles. Bensimhon, MDDATE OF BIRTH:  11-05-1945   DATE OF CONSULTATION:  07/30/2007  DATE OF DISCHARGE:                                 CONSULTATION   PRIMARY CARE PHYSICIAN:  Dr. Birdie Sons   PRIMARY CARDIOLOGIST:  Dr. Bonnee Quin   CHIEF COMPLAINT:  Chest pain.   HISTORY OF PRESENT ILLNESS:  [name] is a 65 year old female with a  history of nonobstructive coronary artery disease by catheterization in  2006.  She awoke with chest pain at 3:45 on Monday.  She describes it as  a squeezing and 10/10.  There were no associated symptoms and no  radiation.  EMS was called and she received baby aspirin x4.  In the  emergency room she received sublingual nitroglycerin with partial  relief.  She also got a GI cocktail which was no help.  The pain would  wax and wane, decreasing to a 1/10 at times, but this a.m. it was as  5/10 when she got another nitroglycerin and she feels like it may have  helped a little bit.  She also got a GI cocktail which she did not feel  helped at all.  Currently her pain is a 1/10.  Since it woke her, it has  never resolved.   She had chest pain in 2006 and had an abnormal stress test and a  resultant heart catheterization.  However, she states this pain is  different from that pain and this pain she has never had before.  She  has no exertional symptoms, as she says she is active at work and active  at home with her grandson and never has any chest pain.  Although she  still complains of 1/10 chest pain she appears fairly comfortable.   PAST MEDICAL HISTORY:  1. Status post cardiac catheterization in 2006 with a normal EF, LAD      30-40%, diagonal one 40%, RCA 20%.  2. Diabetes.  3. Hyperlipidemia.  4. Family history of premature coronary artery  disease.  5. Ongoing tobacco use.  6. Obesity.  7. History of gastroesophageal reflux disease/hiatal hernia but no      symptoms recently.   SURGICAL HISTORY:  She is status post cardiac catheterization x2 as well  as hysterectomy, cholecystectomy, and bilateral carpal tunnel.   ALLERGIES:  ADVIL.   CURRENT MEDICATIONS:  DVT Lovenox at 40 mg daily and her home insulin  dose.   SOCIAL HISTORY:  She lives in [place] with her husband, daughter and  grandchildren.  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  She has approximately 40 pack-year  history of ongoing tobacco use and denies alcohol or drug abuse.   FAMILY HISTORY:  Her father died at age 35 and no further information is  available.  Her mother died at age 51 with a history of MI and a  pacemaker.  She has three brothers all of whom have a history of  coronary artery disease and two sisters, none of whom have a history of  coronary artery disease.   REVIEW OF SYSTEMS:  Significant for chronic arthralgias, especially in  her hands which are worse in cold weather.  She says that she has not  had any reflux symptoms recently and denies any belly pain, hematemesis,  hemoptysis or melena.  She has not had any fevers or chills or  illnesses.  She never hears herself wheeze but states that she coughs  regularly.  The cough is nonproductive and worse in the morning.  She  has no issues with urination.  Full 14-point review of systems is  otherwise negative.   PHYSICAL EXAMINATION:  VITAL SIGNS:  Temperature is 97.8, blood pressure  139/68, pulse 77, respiratory rate 20, O2 saturation 94% on room air.  GENERAL:  She is a well-developed, well-nourished white female in no  acute distress.  HEENT:  Normal.  NECK:  There is no lymphadenopathy, thyromegaly, bruit or JVD noted.  CARDIOVASCULAR:  Her heart is regular in rate and rhythm with an S1, S2  and a soft murmur is noted at  the left upper sternal border.  Distal  pulses are intact in all four extremities and no femoral bruits are  appreciated.  LUNGS:  She has a few rhonchi that cleared with cough.  There is a  slight upper airway wheezing noted at times.  Her chest wall was tender  to palpation in the sternal area.  SKIN:  No rashes or lesions are noted.  ABDOMEN:  Soft and she has active bowel sounds.  There is slight right  upper quadrant tenderness but no rebound or guarding and no  hepatosplenomegaly is noted.  EXTREMITIES:  There is no cyanosis, clubbing or edema noted.  MUSCULOSKELETAL:  There is no joint deformity or effusion, no spine or  CVA tenderness.  NEUROLOGIC:  She is alert and oriented.  Cranial nerves II-XII grossly  intact.   Chest x-ray:  No acute disease.   EKG:  Sinus bradycardia, rate 59, with no acute ischemic changes and Q-  waves in lead III only which are unchanged from an EKG dated 2006.   Laboratory values include hemoglobin of 15, hematocrit 44.  Sodium 140,  potassium 3.6, BUN 10, creatinine 0.7, glucose 100.  Total cholesterol  164, triglycerides 196, HDL 25, LDL 100.  Hemoglobin A1c 6.7.  CK-MB and  troponin I negative x3.   IMPRESSION:  [name] has atypical chest pain without objective  evidence of ischemia.  She has been catheterized twice and has not had  obstructive disease.  A noncardiac origin is suspected for her chest  pain.  She is stable for outpatient evaluation and a Myoview will be  scheduled as well as a followup visit with Dr. [name].  Smoking  cessation was strongly encouraged as well as compliance with a diabetic  diet.      Rachael Demark, PA-C      Bevelyn Buckles. Bensimhon, MD  Electronically Signed    RB/MEDQ  D:  07/31/2007  T:  07/31/2007  Job:  [id number]   cc:   [name], MD

## 2011-03-11 NOTE — Op Note (Signed)
[name], [name]               ACCOUNT NO.:  [id number]   MEDICAL RECORD NO.:  [id number]          PATIENT TYPE:  AMB   LOCATION:  ENDO                         FACILITY:  [facility]   PHYSICIAN:  Iva Boop, M.D. LHCDATE OF BIRTH:  November 09, 1945   DATE OF PROCEDURE:  11/11/2005  DATE OF DISCHARGE:  11/11/2005                                 OPERATIVE REPORT   PROCEDURE:  Small bowel capsule endoscopy.   REFERRING PHYSICIAN:  Rachael Fee, M.D.   REASON FOR PROCEDURE:  This is a 65 year old woman with abdominal pain that  is unexplained.   PROCEDURE INFO:  1.  Height 6 feet 2 inches.  2.  Weight 180 pounds.  3.  Waist 40 inches, protuberant abdomen build.   Gastric passage time is 0 hours, 53 minutes.  Small bowel passage time 1  hour, 16 minutes.  This is fairly rapid transit.   FINDINGS:  There was a nonspecific, erythematous depression in one spot, but  overall normal examination, I think, save for that.  This complete study of  the capsule traversed the entire small intestine and reached the colon.   SUMMARY/RECOMMENDATIONS:  There is no obvious finding to explain the pain.  Further plans per Dr. Christella Hartigan.      Iva Boop, M.D. [facility]  Electronically Signed     CEG/MEDQ  D:  11/18/2005  T:  11/18/2005  Job:  [id number]

## 2011-03-11 NOTE — Cardiovascular Report (Signed)
NAMESHELINA, LUO               ACCOUNT NO.:  [id number]   MEDICAL RECORD NO.:  [id number]          PATIENT TYPE:  OIB   LOCATION:  1961                         FACILITY:  [facility]   PHYSICIAN:  [name], M.D. [facility] OF BIRTH:  November 18, 1945   DATE OF PROCEDURE:  08/04/2005  DATE OF DISCHARGE:                              CARDIAC CATHETERIZATION   INDICATIONS:  [name] is a very pleasant 65 year old woman who is  followed by Dr. Cato Mulligan.  She has multiple cardiac risk factors for coronary  artery disease.  She has had recent chest pain.  The chest pain has had some  atypical characteristics with tenderness of the left parasternal area  suggestive of costochondritis.  However, she has multiple cardiac risk factors, and a Cardiolite study  suggested apical ischemia.  Based upon this, cardiac catheterization was  recommended.  She was referred for specific catheterization study.   PROCEDURES:  1.  Left heart catheterization  2.  Selective coronary arteriography.  3.  Selective left ventriculography.   DESCRIPTION OF PROCEDURE:  Following informed consent, the patient was  brought to the catheterization laboratory and prepped and draped in the  usual fashion.  Through an anterior puncture, the right femoral artery was  easily entered, a 4-French sheath was placed.  Views of the left and right  coronary arteries were obtained in multiple angiographic projections.  Central aortic and left ventricular pressures were measured with pigtail.  Ventriculography was performed in the RAO projection.  She tolerated the  procedure well and was taken to the holding area in satisfactory clinical  condition without complications.   HEMODYNAMIC DATA.:  1.  Left ventricular pressure 146/6.  2.  Aortic pressure 143/79.  3.  No gradient on pullback across the aortic valve.   ANGIOGRAPHIC DATA.:  1.  Ventriculography was formed in the RAO projection.  Overall systolic      function  appears to be well-preserved.  No segmental abnormalities of      contraction were identified.  2.  The left main coronary artery is free of critical disease.  3.  The left anterior descending artery demonstrates some mild calcification      and some luminal irregularities.  Just at or slightly distal to the      takeoff of the major diagonal branch is an area of focal narrowing      measuring around 35-40% luminal reduction.  There is also involvement of      the diagonal branch, which measures probably around 40% luminal      reduction.  It does not appear to be critical.  The distal LAD wraps the      apex and is without critical narrowing.  4.  The circumflex has mild luminal irregularities and provides two major      marginal branches and is free of critical disease.  There is a third      small marginal branch.  5.  The right coronary artery demonstrates less than 20% narrowing near the      ostium.  There is mild luminal irregularity of the right  coronary artery      but again no evidence of high-grade focal stenosis.  The PDA does      bifurcate.  There is a smaller posterolateral system.  This is also free      of critical disease.   CONCLUSIONS:  1.  Preserved left ventricular function.  2.  Scattered coronary irregularities without critical stenoses.   DISPOSITION:  I have spoken with Dr. Cato Mulligan by phone.  We will have her  follow up closely with him in the office.  She will need probably symptomatic therapy for some of the chest discomfort  and early close follow-up.  She is at risk for an acute coronary syndrome  based upon her baseline risk factors.  I have explained this to her in  detail.      [name], M.D. [facility]  Electronically Signed     TDS/MEDQ  D:  08/04/2005  T:  08/04/2005  Job:  [id number]   cc:   [name], M.D. [facility]  [address] [place]  [place] [address]   Cardiovascular Laboratory

## 2011-03-11 NOTE — Cardiovascular Report (Signed)
[facility]  Patient:    [name], [name] Visit Number: [id number] MRN: [id number]          Service Type: EMS Location: ED Attending Physician:  Benny Lennert Dictated by:   Runell Gess, M.D. Proc. Date: 08/10/01 Admit Date:  08/09/2001 Discharge Date: 08/09/2001   CC:         Cardiac Catheterization Laboratory  [facility], [address], [place], [place] [address]  Lacretia Leigh. Quintella Reichert, M.D.   Cardiac Catheterization  INDICATIONS: The patient is a 66 year old white female, with a history of positive cardiac risk factors including tobacco use and a strong family history of heart disease. She was admitted October 17 with unstable angina by Dr. Jeanella Cara. The patient ruled out for myocardial infarction and was placed on heparin and nitroglycerin. She presents now for diagnostic coronary arteriography.  DESCRIPTION OF PROCEDURE: The patient was brought to the second floor [facility] in the postabsorptive state.  She was premedicated with p.o. Valium.  Her right groin was prepped and shaved in the usual sterile fashion.  Xylocaine 1% was used for local anesthesia.  A 6 French sheath was inserted into the right femoral artery using standard Seldinger technique. A 6 French right and left diagnostic catheter as well as a 6 French pigtail catheter were used for selective coronary angiography, left ventriculography, and supravalvar aortography in the LAO cranial view to rule out aortic dissection. Omnipaque dye was used for the entirety of the case. Retrograde aorta, left ventricular and pullback pressures were recorded.  HEMODYNAMICS: 1. Aortic systolic pressure 127, diastolic pressure 166. 2. Left ventricular systolic pressure 126, end-diastolic pressure    24.  SELECTIVE CORONARY ANGIOGRAPHY: 1. Left main: Normal. 2. Left anterior descending: Normal. 3. Left circumflex:  Normal. 4. Right coronary artery: Dominant and normal.  LEFT VENTRICULOGRAPHY: The RAO left ventriculogram was performed using 25 cc of Omnipaque dye at 12 cc/sec. The overall LVEF was estimated greater than 60% without focal wall motion abnormalities.  SUPRAVALVULAR AORTOGRAPHY: Supravalvular aortogram was performed in the LAO cranial view using 20 cc of Omnipaque dye at 20 cc/sec. There is no evidence of aortic insufficiency or aortic dissection. The ______ vessels appeared intact.  IMPRESSION: The patient has essentially normal coronary arteries and normal left ventricular function. I do not think her chest pain is cardiac in nature but most likely gastrointestinal.  The sheaths were removed and pressure was held on the groin to achieve hemostasis after an ACT was measured. Dr. Modena Morrow was notified of these results.  The patient will be discharged home today on a proton pump inhibitor and will have a groin check in our office in two weeks after which she will follow up with Dr. Modena Morrow for continued care. She left the lab in stable condition. Dictated by:   Runell Gess, M.D. Attending Physician:  Benny Lennert DD:  08/10/01 TD:  08/11/01 Job: [id number]

## 2011-03-11 NOTE — Op Note (Signed)
   NAME:  [name], [name]                         ACCOUNT NO.:  [id number]   MEDICAL RECORD NO.:  [id number]                   PATIENT TYPE:  AMB   LOCATION:  ENDO                                 FACILITY:  [facility]   PHYSICIAN:  Georgiana Spinner, M.D.                 DATE OF BIRTH:  05/04/1946   DATE OF PROCEDURE:  DATE OF DISCHARGE:                                 OPERATIVE REPORT   PROCEDURE:  Colonoscopy.   INDICATIONS FOR PROCEDURE:  Abdominal pain.   ANESTHESIA:  Demerol 20, Versed 2 mg.   DESCRIPTION OF PROCEDURE:  With the patient mildly sedated in the left  lateral decubitus position, a rectal examination was performed which  revealed trace positive material. Subsequently, the Olympus videoscopic  pediatric colonoscope was inserted in the rectum and passed under direct  vision to the cecum identified by the ileocecal valve and appendiceal  orifice both of which were photographed. From this point, the colonoscope  was slowly withdrawn taking circumferential views of the entire colonic  mucosa stopping only in the rectum which appeared normal on direct and  showed small hemorrhoids on retroflexed view. The endoscope was straightened  and withdrawn. The patient's vital signs and pulse oximeter remained stable.  The patient tolerated the procedure well without apparent complications.   FINDINGS:  Unremarkable examination other than hemorrhoids which may account  for trace positive material.   PLAN:  Will have the patient followup with me as an outpatient.                                                 Georgiana Spinner, M.D.    GMO/MEDQ  D:  07/29/2002  T:  07/29/2002  Job:  [id number]   cc:   Lacretia Leigh. Quintella Reichert, M.D.

## 2011-03-11 NOTE — Op Note (Signed)
   NAME:  [name], [name]                         ACCOUNT NO.:  [id number]   MEDICAL RECORD NO.:  [id number]                   PATIENT TYPE:  AMB   LOCATION:  ENDO                                 FACILITY:  [facility]   PHYSICIAN:  Georgiana Spinner, M.D.                 DATE OF BIRTH:  1945/12/02   DATE OF PROCEDURE:  DATE OF DISCHARGE:                                 OPERATIVE REPORT   PROCEDURE:  Upper endoscopy.   INDICATIONS FOR PROCEDURE:  Abdominal pain.   ANESTHESIA:  Demerol 50, Versed 5 mg.   DESCRIPTION OF PROCEDURE:  With the patient mildly sedated in the left  lateral decubitus position, the Olympus videoscopic endoscope was inserted  in the mouth and passed under direct vision through the esophagus which  appeared normal into the stomach. The fundus, body, antrum, duodenal bulb  and second portion of the duodenum appeared normal. From this point, the  endoscope was slowly withdrawn taking circumferential views of the entire  duodenal mucosa until the endoscope was then pulled back in the stomach,  placed in retroflexion to view the stomach from below. The endoscope was  then straightened and withdrawn taking circumferential views of the  remaining gastric and esophageal mucosa. The patient's vital signs and pulse  oximeter remained stable. The patient tolerated the procedure well without  apparent complications.   FINDINGS:  This is essentially a negative endoscopic examination.   PLAN:  Proceed to colonoscopy as planned.                                                Georgiana Spinner, M.D.    GMO/MEDQ  D:  07/29/2002  T:  07/29/2002  Job:  [id number]   cc:   Lacretia Leigh. Quintella Reichert, M.D.

## 2011-03-11 NOTE — H&P (Signed)
[facility]  Patient:    [name], [name]                      MRN: [id number] Adm. Date:  [id number] Disc. Date: [id number] Attending:  Brandy Hale CC:         Lacretia Leigh. Quintella Reichert, M.D.   History and Physical  CHIEF COMPLAINT:  Right upper quadrant pain.  HISTORY OF PRESENT ILLNESS:  This is a 65 year old white female who gives a one year history of intermittent episodes of postprandial right upper quadrant pain and nausea. Her initial attack was about one year ago at which time she was seen in the [facility] Emergency Room. She had a CAT scan at that time which showed a gallstone and also a thickened loop of jejunum in the left upper quadrant. Since that time she has had at least four other episodes of mild right upper quadrant pain and nausea after supper. Yesterday after eating some tacos for supper, she developed more severe epigastric and right upper quadrant pain. She took a Prevacid tablet which did not help. She vomited one time. She has not had any diarrhea and has not had any fever. Her bowel movements have been normal. The pain went away during the middle of the night but then recurred today. She was seen by Dr. Tinnie Gens C. Hooper who felt that she had acute cholecystitis clinically. He called me and she is brought to the [facility] Emergency Room for evaluation.  PAST MEDICAL HISTORY: 1. Vaginal hysterectomy by Dr. Darcella Cheshire in 1997. 2. Bilateral carpal tunnel releases. 3. She denies medical problems.  CURRENT MEDICATIONS:  Prevacid p.r.n.  ALLERGIES:  ADVIL causes hyperventilation.  SOCIAL HISTORY:  The patient lives in [place]. She is married and they have two children. She smokes 1-1/2 pack of cigarettes a day. She denies use of alcohol. 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  FAMILY HISTORY:  Father died at age 70 of unknown cause. He did have  peptic ulcer disease. Mother living, age 67 and has coronary artery disease, diabetes and hypertension. She has five brothers, four with coronary artery disease, two with diabetes. She has two sisters, no known medical problems.  REVIEW OF SYSTEMS:  All systems are reviewed and are negative except as described above.  PHYSICAL EXAMINATION:  GENERAL:  Somewhat overweight, pleasant, cooperative white female in moderate distress.  VITAL SIGNS:  Temperature 97.4, respirations 16, pulse 70, blood pressure 132/74.  HEENT:  Sclerae clear, extraocular movements intact, oropharynx clear.  NECK:  Supple, nontender, no mass, no adenopathy, no thyromegaly, no bruit.  LUNGS:  Clear to auscultation, mild right costovertebral angle tenderness.  CARDIAC:  Regular rate and rhythm, no murmur.  BREASTS:  Exam deferred.  ABDOMEN:  Obese, soft, very tender in the right upper quadrant and also somewhat tender in the epigastrium with some guarding, but no mass, no distention, bowel sounds are present. There is no hernia or scar noted.  EXTREMITIES:  Free range of motion, no deformity. No edema, good pulses.  NEUROLOGICAL:  Grossly within normal limits.  ADMISSION LABORATORY AND ACCESSORY DATA:  Gallbladder ultrasound shows multiple gallstones and a contracted gallbladder. Common bile duct is 3.9 mm in diameter. There is no fluid around the gallbladder. Liver function tests show an alkaline phosphatase of 119 which is borderline elevated, otherwise, normal liver function tests. Serum amylase is 48 (normal), lipase is 30 (normal).  CBC shows a hemoglobin of 15.1, white blood cell count 11.3 which is elevated.  Urinalysis is normal.  IMPRESSION: 1. Acute cholecystitis with cholelithiasis. 2. Tobacco abuse. 3. Status post vaginal hysterectomy.  PLAN: 1. The patient will be admitted and will undergo laparoscopic cholecystectomy    with cholangiogram today. 2. I discussed the indications and  details of surgery with the patient and her    husband. The risks and complications were outlined including but not    limited to bleeding, infection, bile duct injury, intestinal injury,    conversion to open laparotomy, wound problems and herniae, and other    unforeseen problems. She seems to understand these issues well. At this   time, all of her questions are answered. She very much wants to go ahead    with the surgery today because of continued pain. DD:  12/30/00 TD:  01/01/01 Job: [id number]

## 2011-03-11 NOTE — Op Note (Signed)
[facility]  Patient:    [name], [name]                      MRN: [id number] Proc. Date: 12/30/00 Adm. Date:  [id number] Disc. Date: [id number] Attending:  Brandy Hale CC:         Feliciana Rossetti, M.D.   Operative Report  PREOPERATIVE DIAGNOSIS:  Acute cholecystitis with cholelithiasis.  POSTOPERATIVE DIAGNOSIS:  Acute cholecystitis with cholelithiasis.  OPERATION:  Laparoscopic cholecystectomy with intraoperative cholangiogram.  SURGEON:  Angelia Mould. Derrell Lolling, M.D.  FIRST ASSISTANT:  Sharlet Salina T. Hoxworth, M.D.  OPERATIVE INDICATIONS:  This is a 65 year old white female who presents with an 18-hour history of moderately severe right upper quadrant pain, nausea and vomiting.  This occurred after a heavy meal last night.  She has had several episodes in the past.  Ultrasound shows gallstones and a contracted gallbladder.  Liver function tests show mild elevation of the alkaline phosphatase.  White blood cell count is 11,300.  On examination, she was found to be quite tender in the right upper quadrant and was quite uncomfortable. We elected to take her to the operating room directly from the emergency room.  INTRAOPERATIVE FINDINGS:  The gallbladder was thick walled, discolored, and appeared chronically and acutely inflamed.  There was no exudate, and there was no ischemia, however.  The liver appeared normal.  The intraoperative cholangiogram was normal showing normal intrahepatic and extrahepatic bile ducts, no filling defects, and prompt flow of contrast into the duodenum.  The small intestine and large intestine, stomach, duodenum, and peritoneal surfaces were normal to inspection.  There were some adhesions in the lower abdomen presumably from previous hysterectomy.  OPERATIVE TECHNIQUE:  Following the induction of general endotracheal anesthesia, the patients abdomen was prepped and draped in sterile fashion. Marcaine 0.5% with  epinephrine was used as local infiltration anesthetic.  A vertical incision was made in the lower end of the umbilicus.  The fascia was incised in the midline and the abdominal cavity entered under direct vision. A 10 mm Hasson trocar was inserted and secured with pursestring suture of 0 Vicryl.  Pneumoperitoneum was created.  Video camera was inserted with visualization and findings as described above.  A 10 mm trocar was placed in the subxiphoid region, and two 5 mm trocars placed in the right mid abdomen. The gallbladder was identified.  The fundus was elevated.  Adhesions were taken down.  The infundibulum of the gallbladder was retracted laterally.  We dissected out the cystic duct and cystic artery.  We identified the cystic artery as it went onto the wall of the gallbladder.  We then controlled the cystic artery with metal clips and then divided it.  We created a nice window behind the cystic duct.  We controlled the cystic duct close to the gallbladder with a metal clip.  We inserted a cholangiogram catheter into the cystic duct.  Cholangiogram was obtained using the C-arm.  The cholangiogram showed normal anatomy, no filling defect, and no obstruction as described above.  The cholangiogram catheter was removed, and the cystic duct was secured with multiple metal clips and divided.  The gallbladder was dissected from its bed with electrocautery and removed through the umbilical port.  That dissection was uneventful.  The operative field and the subhepatic space and the subphrenic space were  copiously irrigated with saline.  There was no bleeding or bile leak whatsoever at the completion of the case.  The irrigation fluid  was clear.  The trocars were then removed under direct vision, and there was no bleeding from the trocar sites.  The pneumoperitoneum was released.  The fascia at the umbilicus was closed with 0 Vicryl sutures. The skin incisions were closed with subcuticular  sutures of 4-0 Vicryl and Steri-Strips.  Clean bandages were placed, and the patient was taken to the recovery room in stable condition.  Estimated blood loss was about 20 cc. Complications were none.  Sponge, needle, and instrument counts were correct. DD:  12/30/00 TD:  01/01/01 Job: [id number]

## 2011-03-11 NOTE — Consult Note (Signed)
[facility]  Patient:    [name], [name] Visit Number: [id number] MRN: [id number]          Service Type: MED Location: 2000 2039 01 Attending Physician:  Pamella Pert Dictated by:   Delrae Rend, M.D. Admit Date:  08/09/2001   CC:         Lacretia Leigh. Quintella Reichert, M.D.  [facility]   Consultation Report  REFERRING PHYSICIAN:  Dr. Tinnie Gens C. Hooper.  REQUESTING PHYSICIAN:  Dr. Smitty Cords. Cheek.  REASON FOR CONSULTATION:  Chest pain.  IMPRESSION: 1. Chest pain very suggestive of unstable angina. 2. Very strong family history of premature coronary artery disease in that    mother had myocardial infarction in her 60s, she had one brother who had a    myocardial infarction at the age of 68 and another younger brother at the    age of 75 has had four to five myocardial infarctions. 3. Smoking. 4. ______ . 5. Bilateral femoral artery bruits with questionable symptoms of claudication.  RECOMMENDATIONS: 1. Based on her symptoms, patient will need further cardiac evaluation.  She    has already had a cardiac stress test in the outpatient basis, the results    of which are not available to me, but was told that it was normal; this was    very recently, in the last one month.  Due to the fact that she has had    normal stress test and now she presents with symptoms which are very    typical for unstable angina and given her multiple cardiac risk factors, we    will proceed with cardiac catheterization.  The risks and benefits of    cardiac catheterization have been explained to the patient and the patient    is willing to proceed. 2. Smoking cessation is highly recommended. 3. Will start the patient on very low dose of beta blockade.  She does have    bilateral expiratory wheezes secondary to COPD and we will watch her for    any evidence of bronchospasm. 4. Will also start the patient on a low dose of ACE inhibitor for  endothelial    stabilization. 5. Will check her lipid status and she does have an appearance of thyromegaly    on physical exam, hence we will also obtain a TSH.  CHIEF COMPLAINT:  Chest pain.  HISTORY:  [name] is a 65 year old female who works in [employer] and was doing well until this morning when she had gone to work, where she had chest pain.  Patient states that while she was at work place, she had midsternal chest pain with radiation to the back in the form of heaviness to a squeezing sensation in the chest.  She states that it was initially 4-5/10 in intensity.  She has had these kind of episodes in the past, maybe about five to six episodes in the last two months, however, this episode was a little bit more severe.  It also lasted for one to two minutes more.  Usually, it lasts for a minute or two but this time, it lasted for about maybe two to three minutes.  After 1 episode of chest pain, she continued to have several episodes in the span of 20 to 30 minutes.  Given this, while she was standing and talking to her supervisor that she needs to be seen by the nurse at the work place, she fell to the ground as  she could not stand anymore.  She denies having lost any consciousness.  She denies any palpitations.  She continued to have several episodes of chest pain and when she came to the emergency room, she had another episode of chest pain which she stated it was 12/10 in intensity and said it was a severe pain that she had experienced, however, before.  She stated that it lasted for again for one to two minutes and resolved on its own.  It was associated with mild diaphoresis.  She denies any shortness of breath, although she states that she does not do much and has a very sedentary lifestyle.  She denies any palpitations.  She denies any history suggestive of paroxysmal nocturnal dyspnea or orthopnea.  REVIEW OF SYSTEMS:  She denies any bowel or bladder  dysfunction although she has in the past been treated for gastroesophageal reflux disease.  She does state that if she were to walk for any long distance, like walking for about a block, she does get pain in both the hips and rest would usually take away the pain.  There is no history of recent weight gain or weight loss.  Other systems were negative.  MEDICATIONS:  Her home medications include Nexium one p.o. q.d.  ALLERGIES:  Allergies to MOTRIN, which she states she breathes heavy if she were to take Motrin.  FAMILY HISTORY:  There is a very strong family history of premature coronary artery disease, as stated above.  SOCIAL HISTORY:  She is married.  She smokes about a pack of cigarettes per day for the last 30 to 35 years.  She does not drink alcohol.  She does not use any illicit drugs.  PHYSICAL EXAMINATION:  GENERAL:  She is moderately bright and mildly obese.  She appears to be in no acute distress.  VITAL SIGNS:  Her vital signs include a heart rate of 74 beats per minute, respirations are 14, blood pressure is 143/76 mmHg, both upper extremities.  CARDIAC:  S1 and S2 are normal.  There is no gallop, no murmur.  CHEST:  Bilateral expiratory wheezes.  These are very faint and few.  ABDOMEN:  Examination was benign.  Bowel sounds were heard in all four quadrants.  EXTREMITIES:  No evidence of edema.  PERIPHERAL VASCULAR:  Exam showed all pulses to be 3+ and equal.  There was the presence of bilateral femoral artery bruits which were very faint.  PERTINENT FINDINGS:  EKG demonstrates normal sinus rhythm, normal axis, non-progression of R wave in the anterior chest lead.  Her CBC revealed a hemoglobin of 14.1, hematocrit of 40.0, white count is 8.9 and platelet count is 264,000.  Her electrolytes, BUN, creatinine and CPK are pending at this time.  Will follow these labs and make further recommendations.  Dictated by:   Delrae Rend, M.D. Attending  Physician:  Pamella Pert DD:  08/09/01 TD:  08/10/01 Job: [id number]

## 2011-05-04 IMAGING — CR DG CHEST 2V
2 series · 2 of 2 positions shown · non-contrast
Comparison: 01/07/2009 and CT [DATE]

CLINICAL DATA: Upper respiratory infection.  Small cell carcinoma
of the lung.  Ex-smoker.

CHEST - 2 VIEW

[view not recorded (1 of 2)]
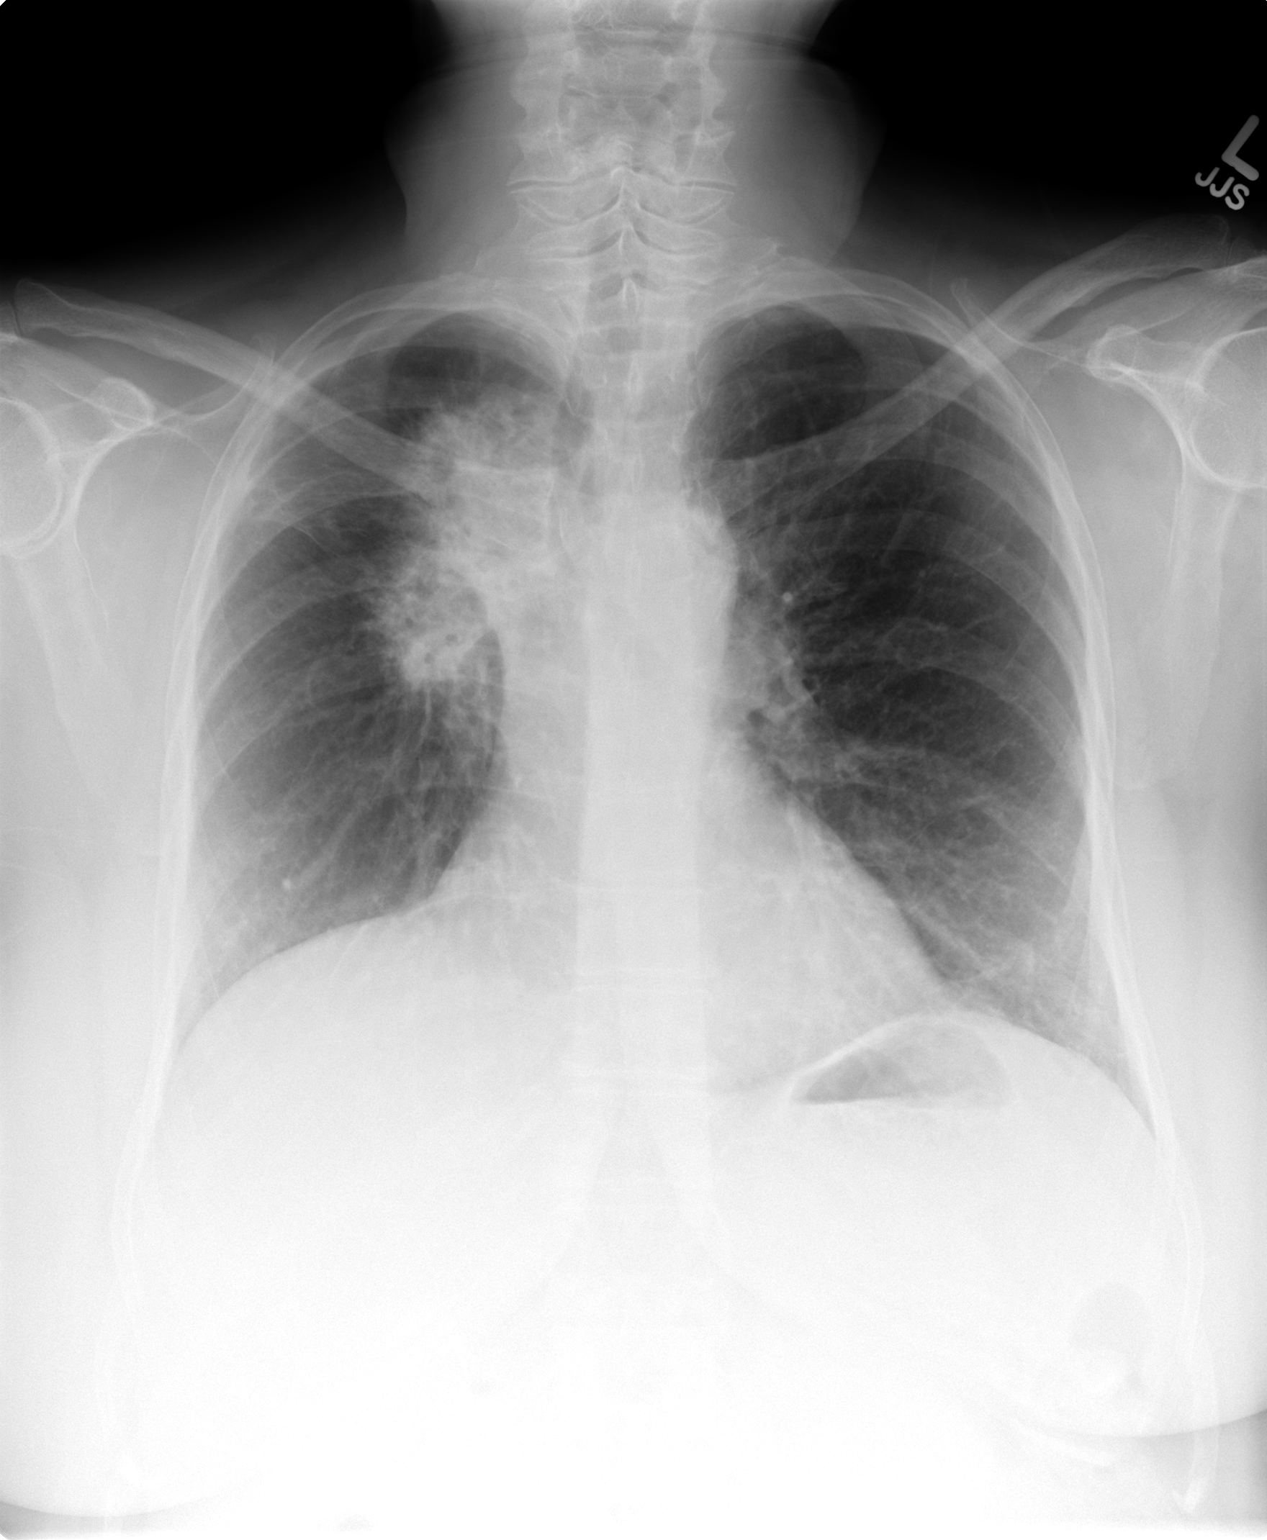

[view not recorded (2 of 2)]
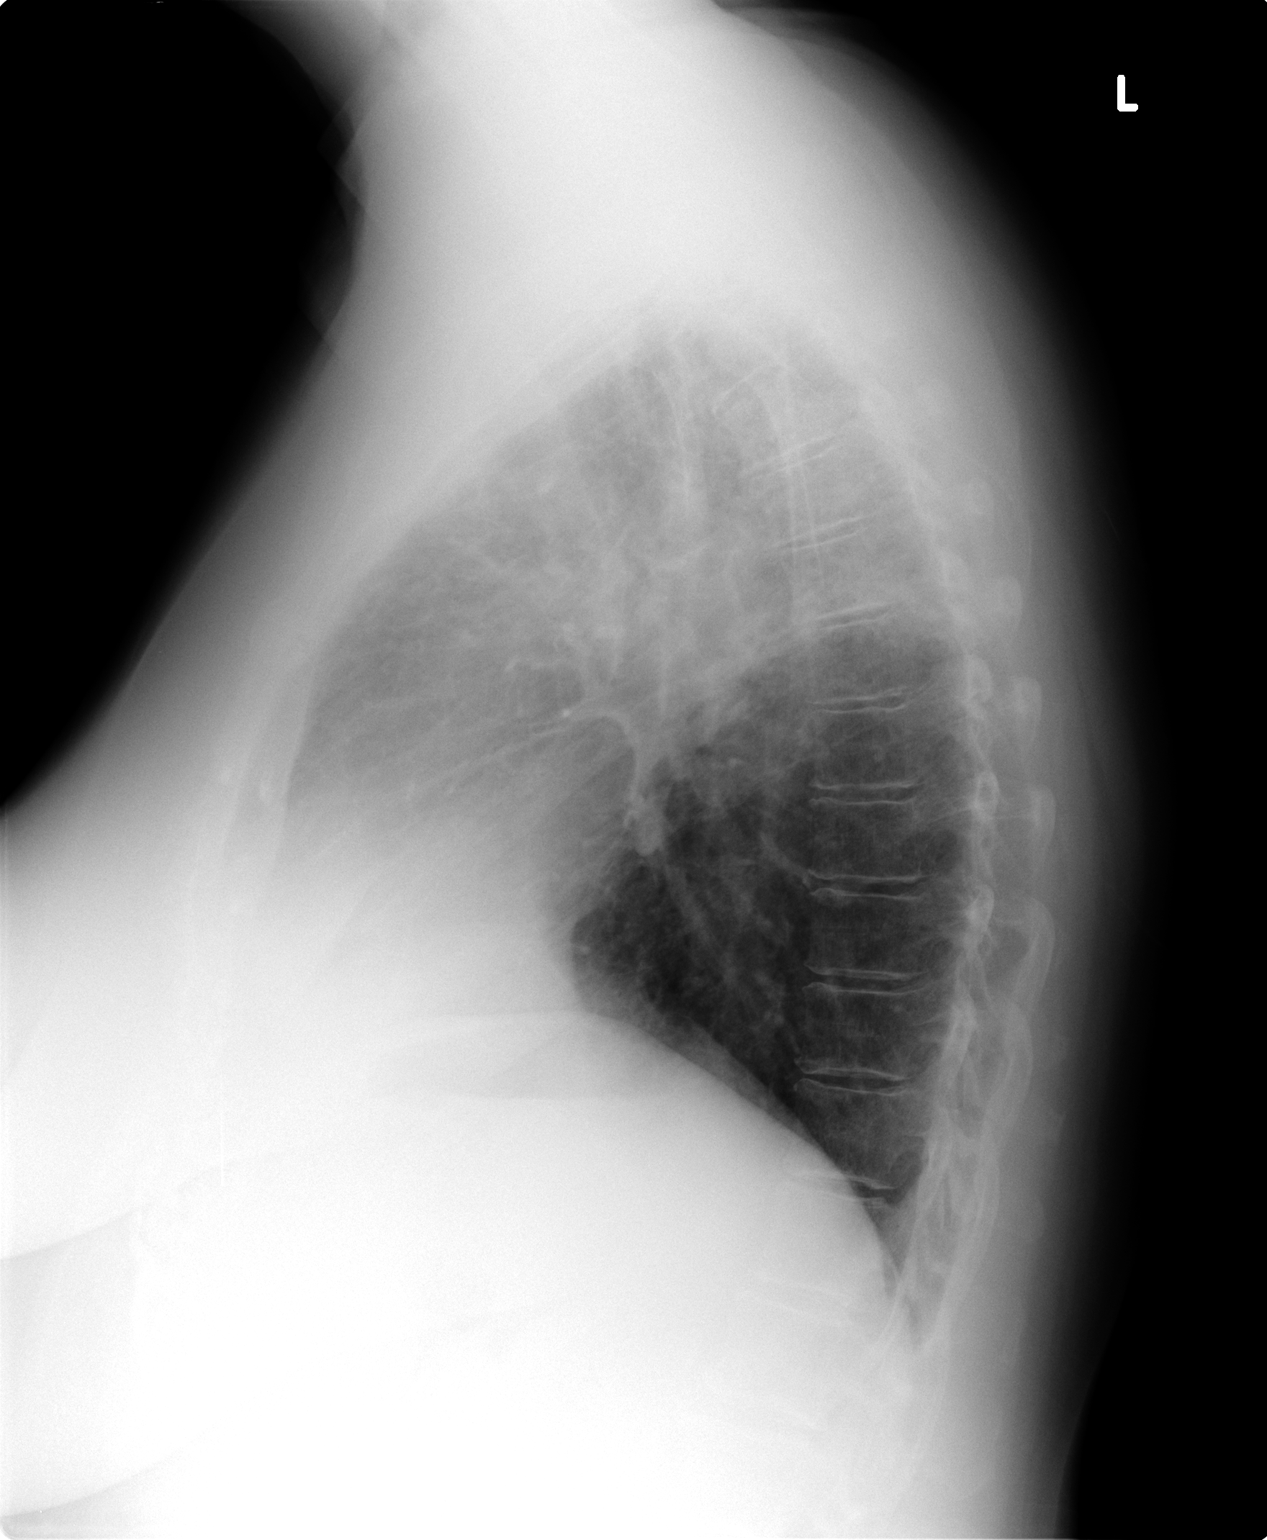

[2 of 2 positions shown; findings below may reference images not displayed]

FINDINGS: There has been retraction of the right hilum with
irregular delineation of the borders , a pattern which would
correlate with radiation changes.  Comparing with coronal images
from the patients recent CT in February 2010 suggests some continued
hilar retraction and fibrosis.

The peripheral aspect of the right upper lobe remains well aerated
with no findings to suggest a postobstructive pneumonitis.  The
remainder of the lung fields are clear.  No pleural fluid is seen

Bony structures appear intact.
IMPRESSION: Findings suggestive of post radiation changes as described above.
No findings to suggest a postobstructive pneumonia.  Because of the
radiation changes, evaluation for recurrence of the patient's small
cell cancer within the area of radiation fibrosis would be
challenging radiographically and if concern warrants, further
evaluation with CT would be recommended

## 2011-05-09 IMAGING — CT CT CHEST W/ CM
2 of 5 series · 15 of 36 positions shown, 18 images · IV contrast (agent unspecified)
Comparison: Multiple prior CT chest examinations dating back to
12/27/2008, most recently 03/10/2010.

CLINICAL DATA: History of lung cancer for which the patient has
undergone chemotherapy and radiation therapy.  The patient presents
with cough, shortness of breath, and abnormal chest x-ray.

CT CHEST WITH CONTRAST 07/06/2010:
TECHNIQUE: Multidetector CT imaging of the chest was performed
following the standard protocol during bolus administration of
intravenous contrast.
Contrast: 80 ml 8mnipaque-DGG IV.

[Series 4: thins · axial · 0.81mm/px · z∈[-323,-63]mm · 12 of 361 slices shown, 15 images]
[im 19/361  mediastinal]
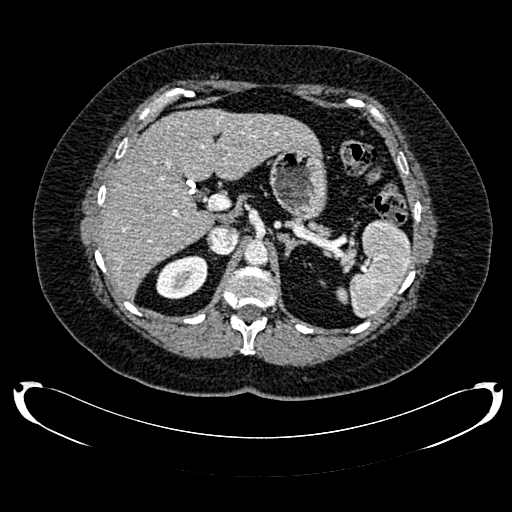
[im 19/361  lung]
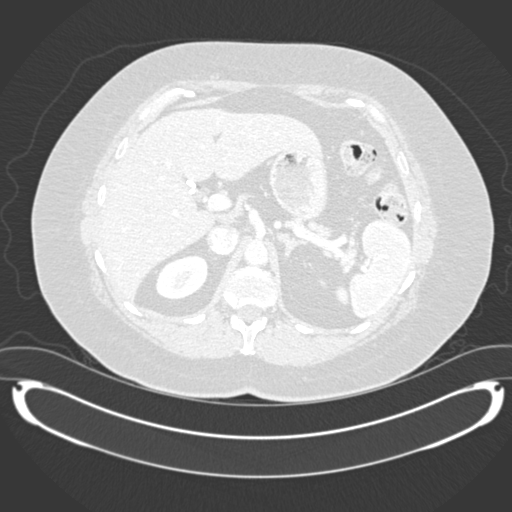
[im 55/361  lung]
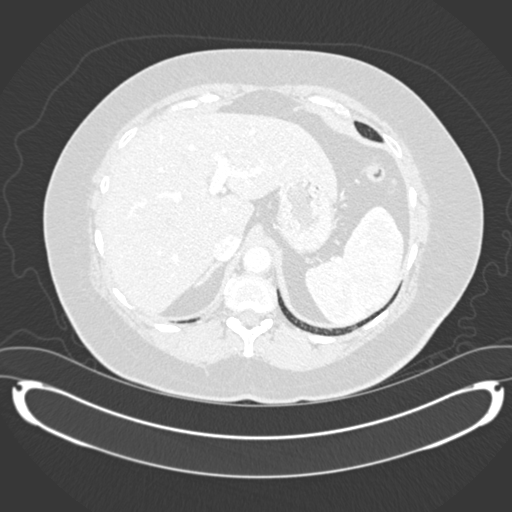
[im 73/361  lung]
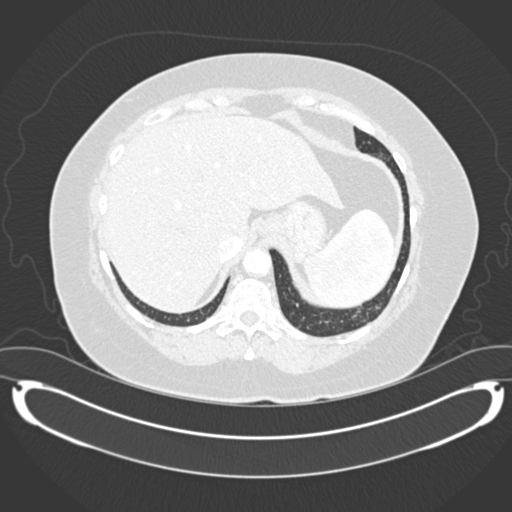
[im 109/361  lung]
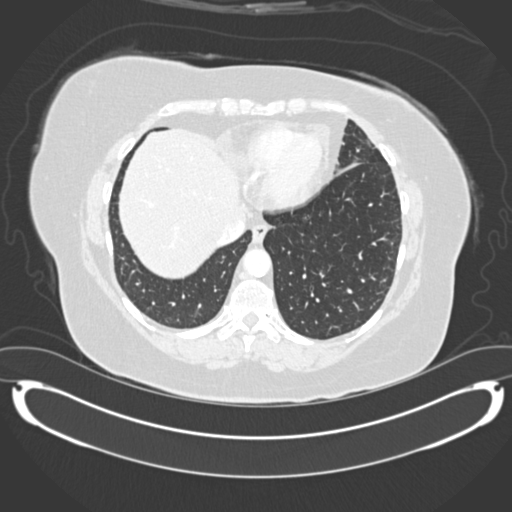
[im 145/361  mediastinal]
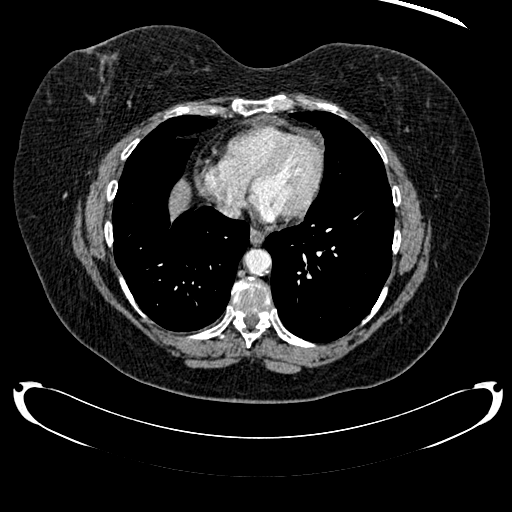
[im 145/361  lung]
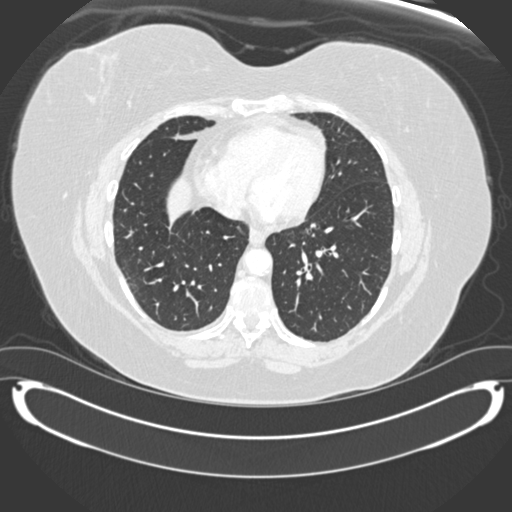
[im 163/361  lung]
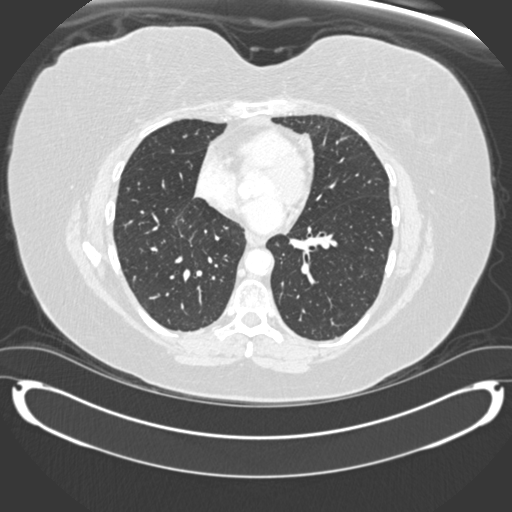
[im 199/361  lung]
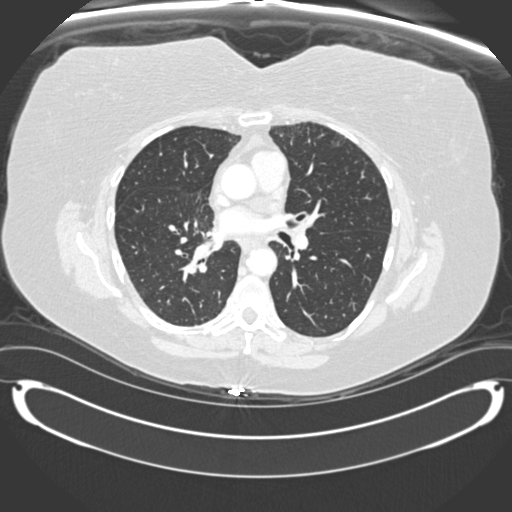
[im 217/361  lung]
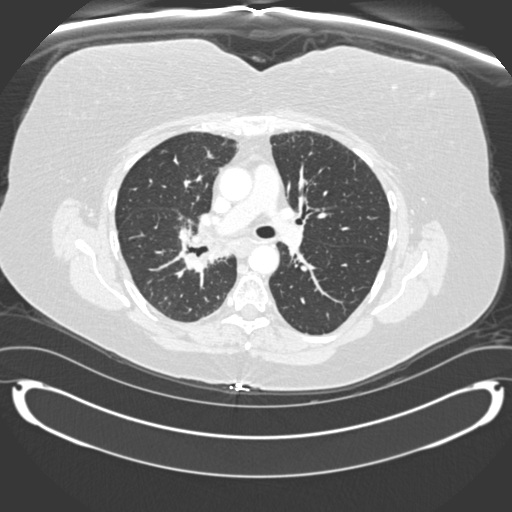
[im 253/361  mediastinal]
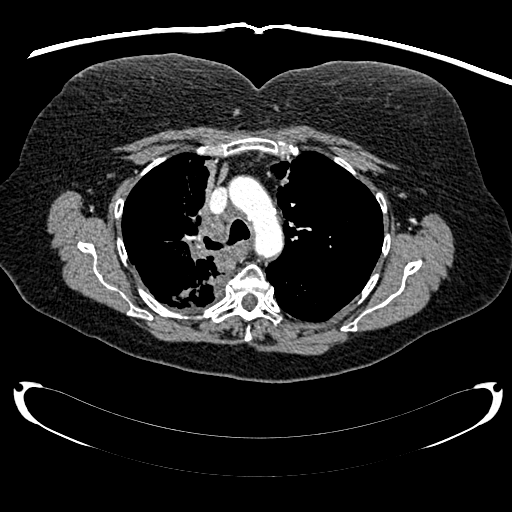
[im 253/361  lung]
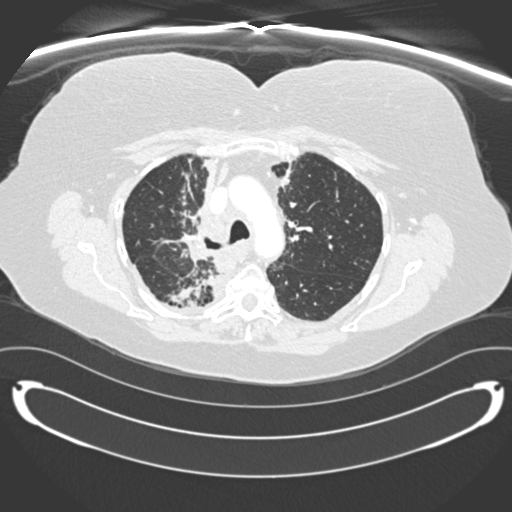
[im 289/361  lung]
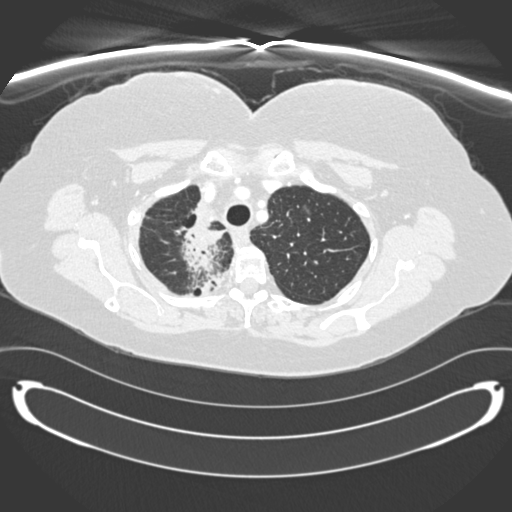
[im 307/361  lung]
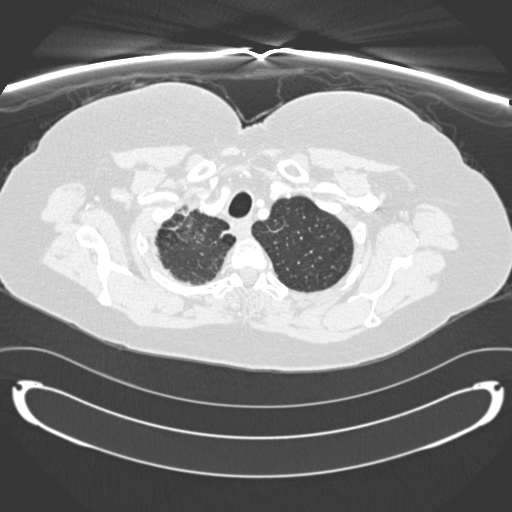
[im 343/361  lung]
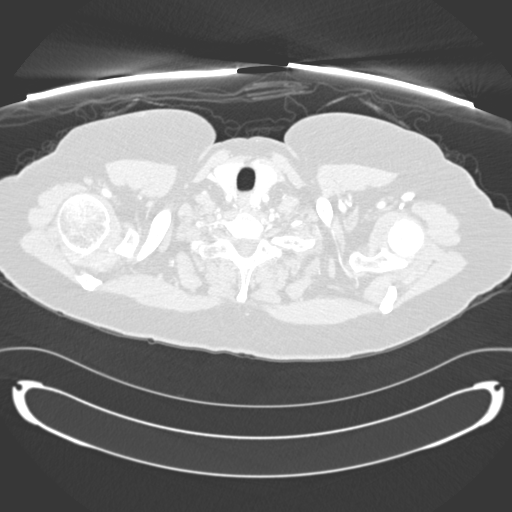

[Series 602: <mpr thick range> · coronal · 0.81mm/px · 3 of 126 slices shown]
[im 26/126  lung]
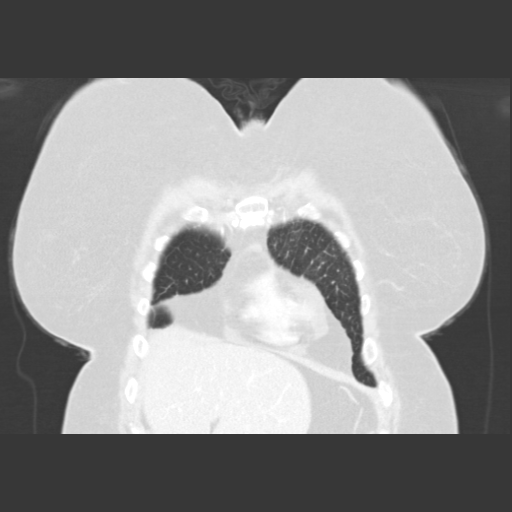
[im 51/126  lung]
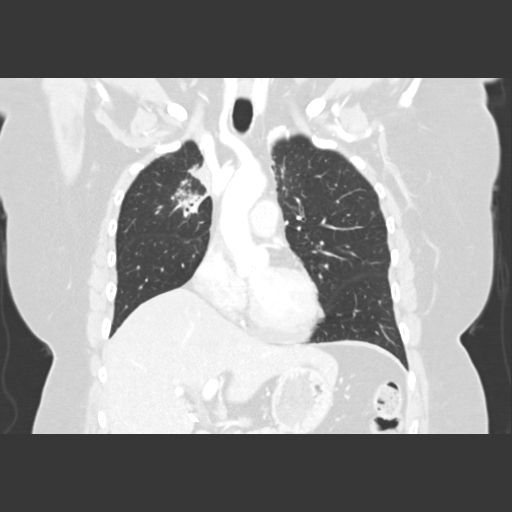
[im 76/126  lung]
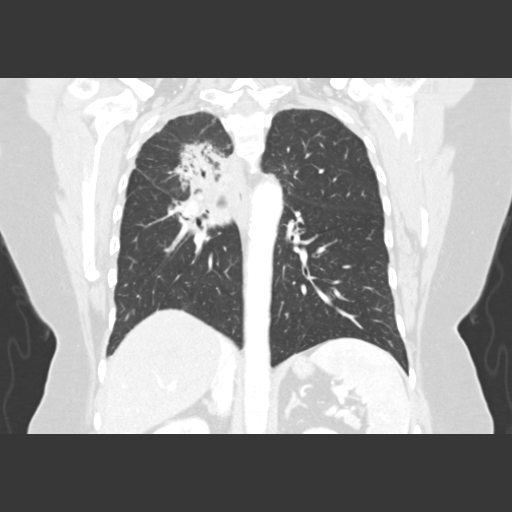

[15 of 36 positions shown; findings below may reference images not displayed]

FINDINGS: Interval redevelopment of a soft tissue mass medially in
the right upper lobe with extension into the right paratracheal
region of the mediastinum and inferior extension to the right
hilum.  Maximum measurements approximate 4.4 x 2.2 x 5.2 cm (axial
image 14 and coronal image 64). Increased airspace opacities in the
medial right upper lobe, the right middle lobe, and the superior
segment right lower lobe at the site of the previous radiation
fibrosis.  Stable tiny (2-3 mm) nodule in the posterolateral right
upper lobe (image 16).  No new or enlarging pulmonary nodules
elsewhere.  No pleural effusions.

Enlarging though still small lymph nodes in the right hilum.  No
enlarging mediastinal lymphadenopathy.  No axillary
lymphadenopathy.  Stable multinodular thyroid goiter.  Heart size
normal with small pericardial effusion which has decreased in size
in the interval.  Moderate atherosclerosis involving the thoracic
and upper abdominal aorta and their branches.

Gallbladder surgically absent.  Visualized upper abdomen
unremarkable for the early portal venous phase of enhancement.
Bone window images demonstrate mild osteopenia but no evidence of
osseous metastatic disease.
IMPRESSION: 1.  Recurrent tumor medially in the right upper lobe with extension
into the right paratracheal region of the mediastinum.  The
recurrent tumor mass is measured above.
2.  Post-obstructive atelectasis and/or pneumonitis in the right
upper lobe, right middle lobe, and superior segment right lower
lobe.
3.  Enlarging though still small right hilar lymph nodes.  No
enlarging lymphadenopathy elsewhere in the chest.

PET CT may be beneficial in further evaluation to confirm the
presence of recurrent tumor.

## 2011-05-17 IMAGING — MR MR HEAD WO/W CM
7 of 11 series · 26 of 48 positions shown · IV contrast (multihance)
Comparison: 08/27/2009

CLINICAL DATA: Small cell lung cancer.  Headache.  Assess for
metastatic disease.

MRI HEAD WITHOUT AND WITH CONTRAST
TECHNIQUE: Multiplanar, multiecho pulse sequences of the brain and
surrounding structures were obtained according to standard protocol
without and with intravenous contrast
Contrast: 17 ml Multihance

[Series 6: DWI · axial · 5.0mm · 1.09mm/px · z∈[-27,+103]mm · 7 of 54 slices shown (1 of 2)]
[im 1/54]
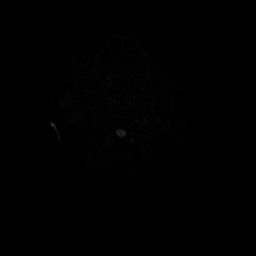
[im 9/54]
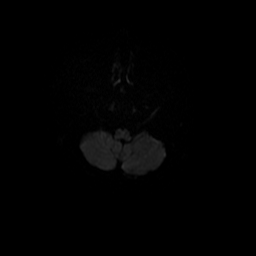
[im 18/54]
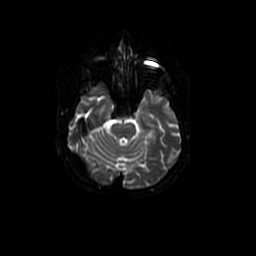
[im 27/54]
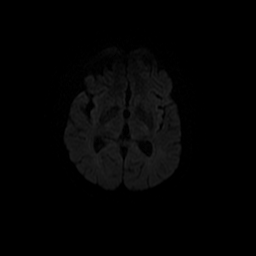
[im 36/54]
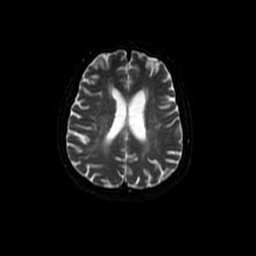
[im 45/54]
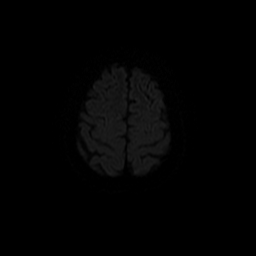
[im 54/54]
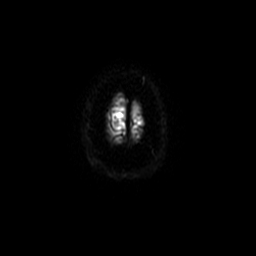

[Series 7: T2 · axial · 5.0mm · 0.43mm/px · z∈[-31,+99]mm · 3 of 21 slices shown]
[im 1/21]
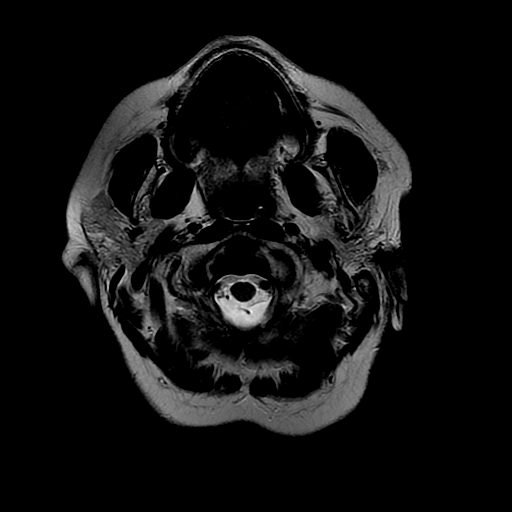
[im 11/21]
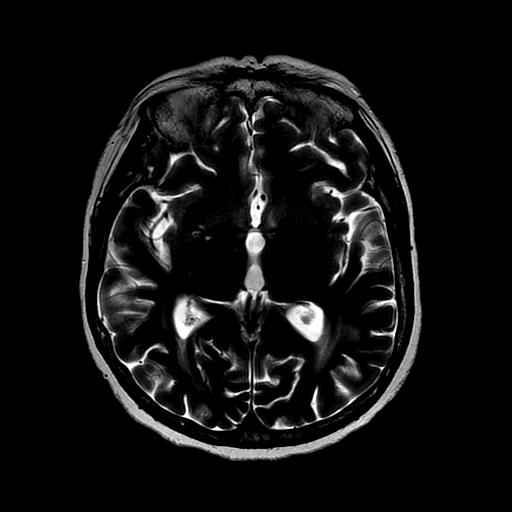
[im 21/21]
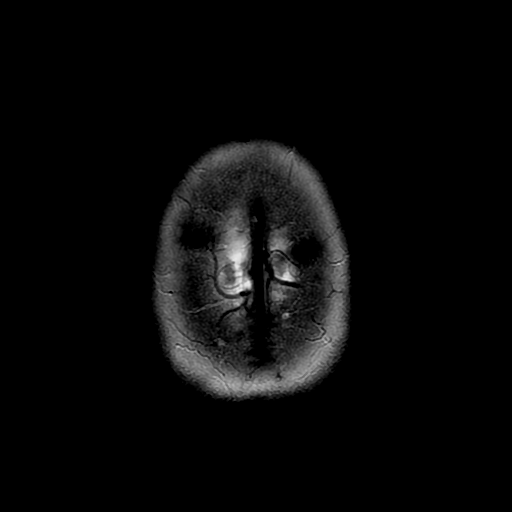

[Series 8: FLAIR · axial · 5.0mm · 0.43mm/px · z∈[-36,+104]mm · 3 of 21 slices shown]
[im 1/21]
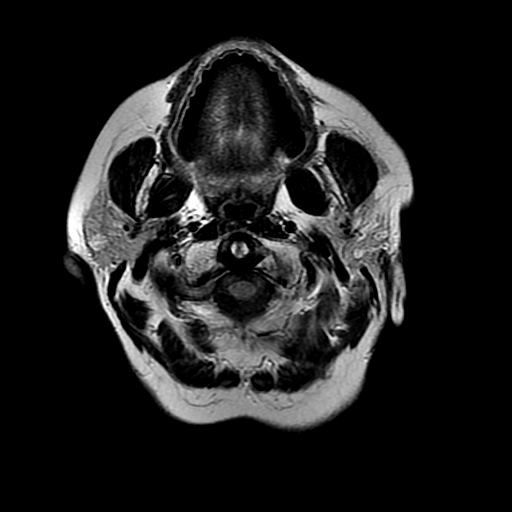
[im 11/21]
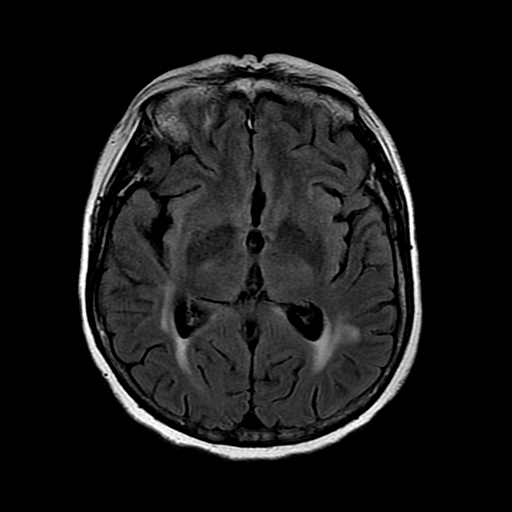
[im 21/21]
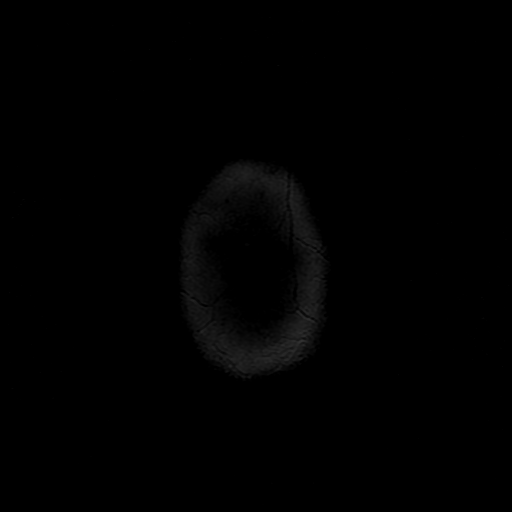

[Series 11: T2 post-contrast · coronal · 5.0mm · 0.45mm/px · 3 of 24 slices shown]
[im 1/24]
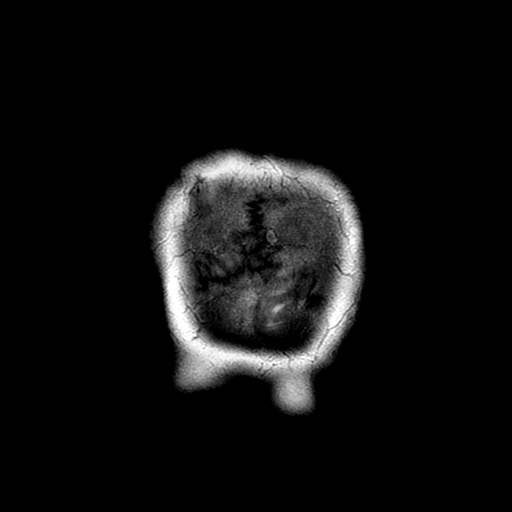
[im 12/24]
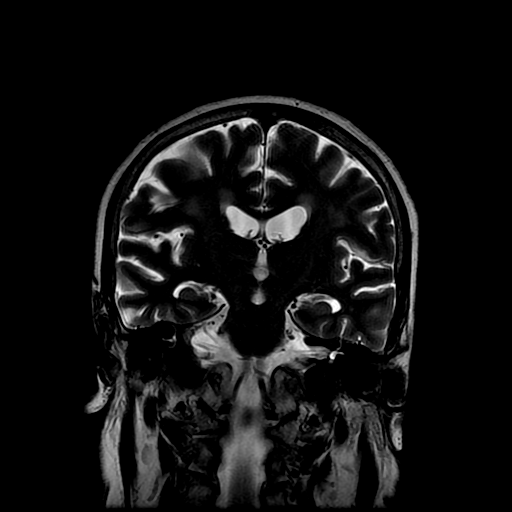
[im 24/24]
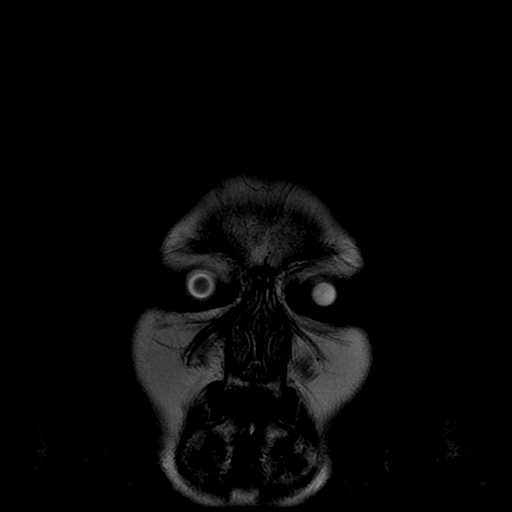

[Series 13: T1 post-contrast · coronal · 5.0mm · 0.45mm/px · 3 of 24 slices shown (1 of 2)]
[im 1/24]
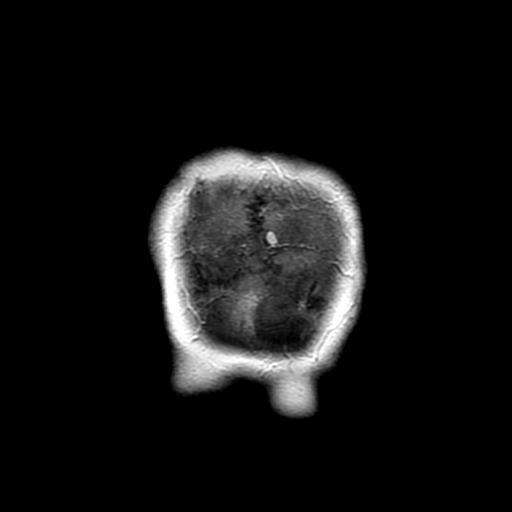
[im 12/24]
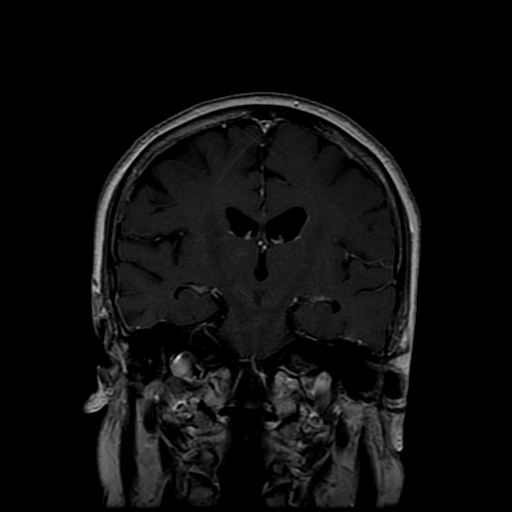
[im 24/24]
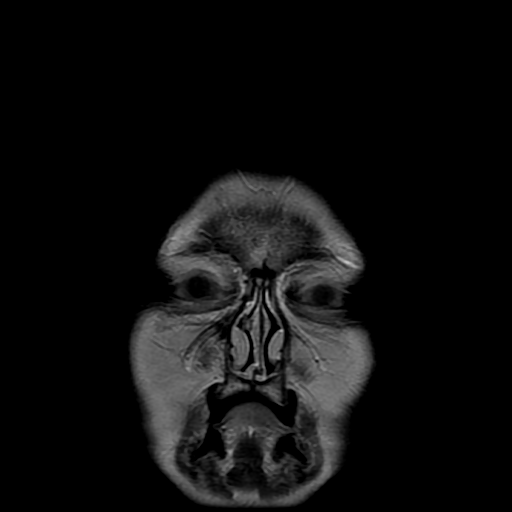

[Series 14: T1 post-contrast · sagittal · 5.0mm · 0.47mm/px · 3 of 25 slices shown (2 of 2)]
[im 1/25]
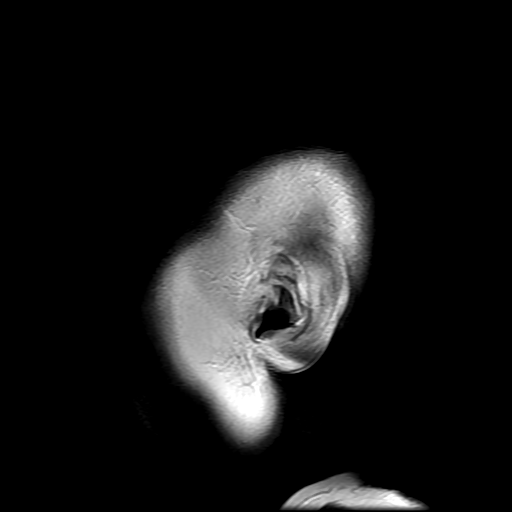
[im 13/25]
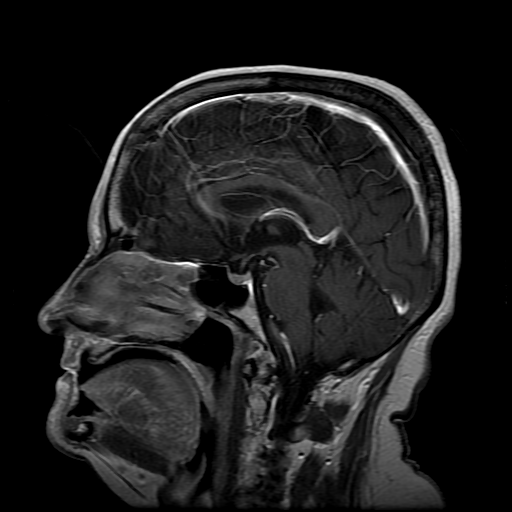
[im 25/25]
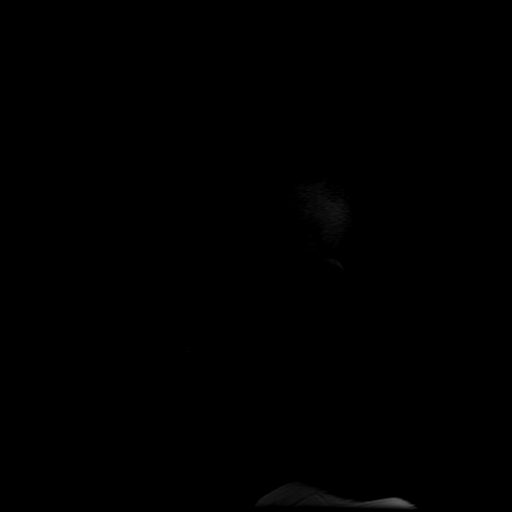

[Series 600: DWI · axial · 5.0mm · 1.09mm/px · z∈[-27,+103]mm · 4 of 27 slices shown (2 of 2)]
[im 1/27]
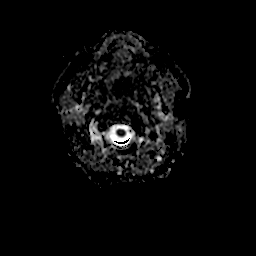
[im 9/27]
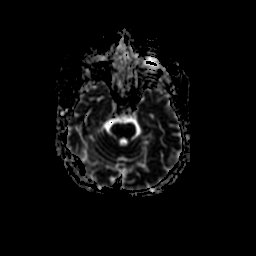
[im 18/27]
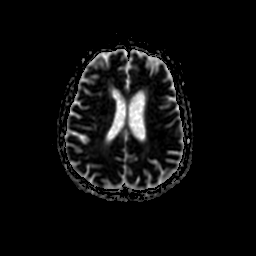
[im 27/27]
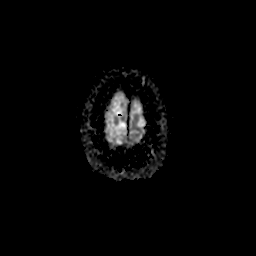

[26 of 48 positions shown; findings below may reference images not displayed]

FINDINGS: Again demonstrated is widespread signal abnormality of
the deep white matter of both cerebral hemispheres with involvement
also of the splenium of the corpus callosum.  Findings could relate
to small vessel disease but the diagnosis does include
demyelinating disease/multiple sclerosis and coccyx effects related
to chemotherapy.  Findings are slightly progressive since [REDACTED]
of last year.  After contrast administration, no abnormal
enhancement occurs.  There is no evidence of mass lesion,
hemorrhage, or extra-axial collection.  The ventricles are slightly
more prominent than were seen on the last exam.  This could relate
to ex vacuo enlargement related to the deep white matter pathology.
No pituitary mass.  No inflammatory sinus disease.  No skull or
skull base lesion is seen.
IMPRESSION: No sign of metastatic disease.

Progressive deep white matter pathology when compared to Eeti
of last year.  Differential diagnosis includes progressive small
vessel disease, demyelinating disease/multiple sclerosis and toxic
effects possibly related to previous chemotherapy.  The ventricles
are slightly larger than were seen last year, but this is probably
due to ex vacuo enlargement related to the deep white matter
pathology.

## 2011-05-29 IMAGING — PT NM PET TUM IMG RESTAG (PS) SKULL BASE T - THIGH
6 series · 25 of 25 positions shown · non-contrast
Comparison: 07/17/2009

CLINICAL DATA: Subsequent treatment strategy for lung cancer.

NUCLEAR MEDICINE PET CT INITIAL (PI) SKULL BASE TO THIGH
TECHNIQUE: 17.5 mCi F-18 FDG was injected intravenously via the
right antecubital fossa .  Full-ring PET imaging was performed from
the skull base through the mid-thighs 90  minutes after injection.
CT data was obtained and used for attenuation correction and
anatomic localization only.  (This was not acquired as a diagnostic
CT examination.)
Fasting Blood Glucose:  95

[Series 1: pet ac · axial · 3.3mm · 4.69mm/px · z∈[-870,+0]mm · 5 of 267 slices shown]
[im 1/267]
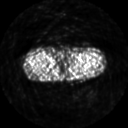
[im 67/267]
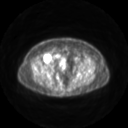
[im 134/267]
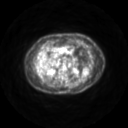
[im 200/267]
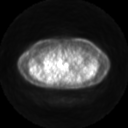
[im 267/267]
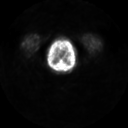

[Series 2: pet nac · axial · 3.3mm · 4.69mm/px · z∈[-870,+0]mm · 6 of 267 slices shown]
[im 1/267]
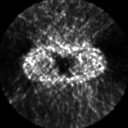
[im 54/267]
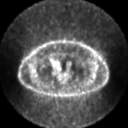
[im 107/267]
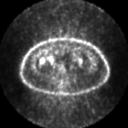
[im 160/267]
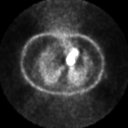
[im 213/267]
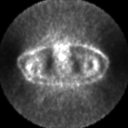
[im 267/267]
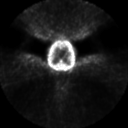

[Series 2: ct images · axial · 3.8mm · 0.98mm/px · z∈[-870,+0]mm · 6 of 267 slices shown]
[im 1/267]
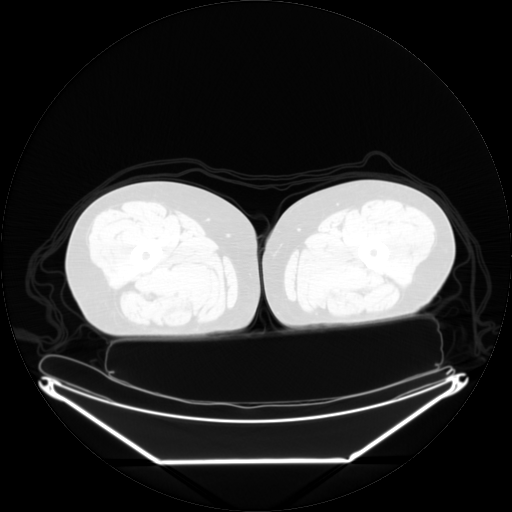
[im 54/267]
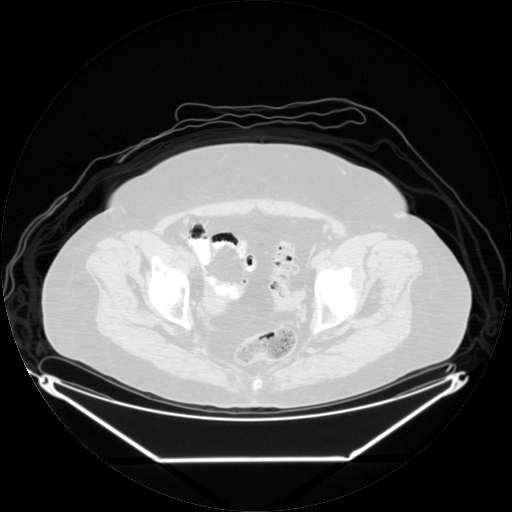
[im 107/267]
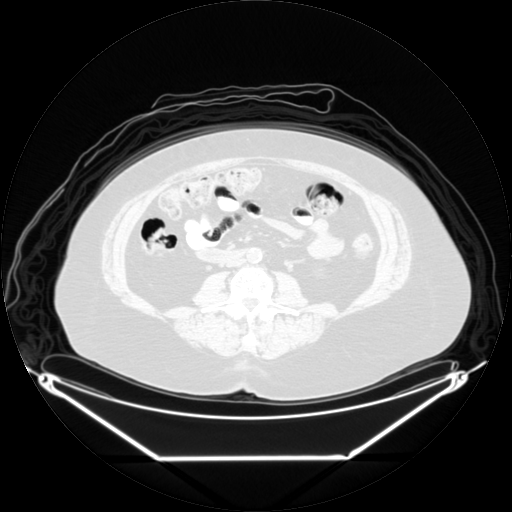
[im 160/267]
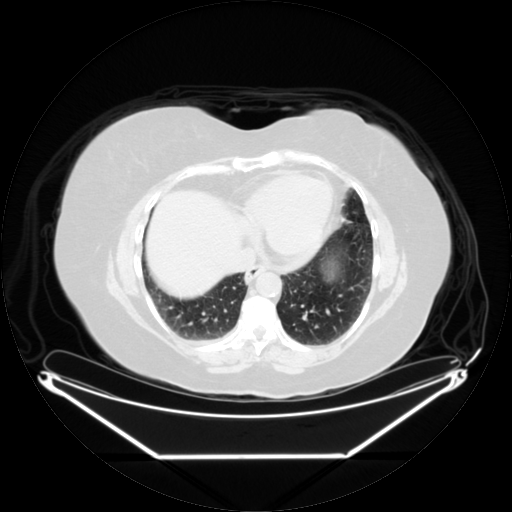
[im 213/267]
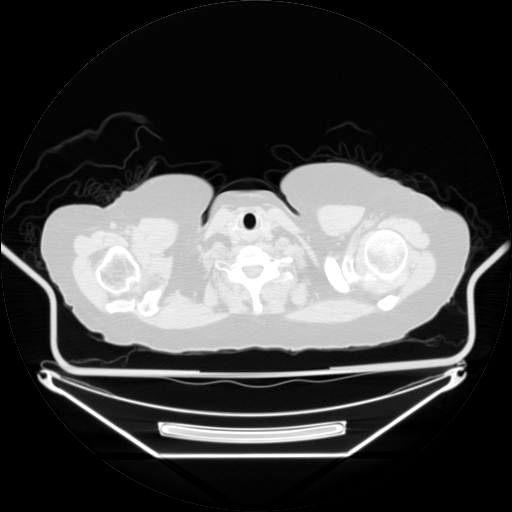
[im 267/267  brain]
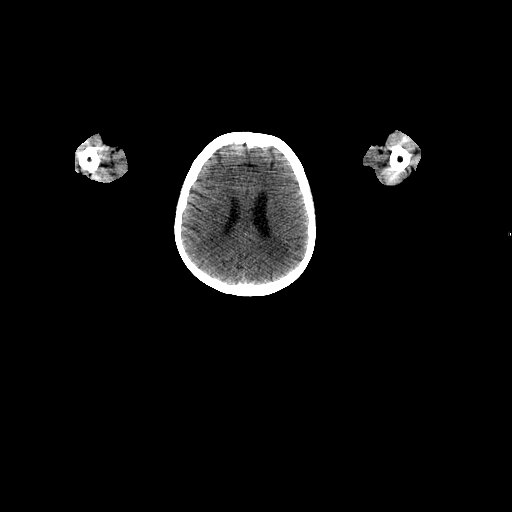

[Series 123: mip · coronal · 3.3mm · 4.69mm/px · 1 of 30 slices shown]
[im 1/30]
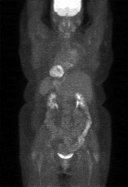

[Series 151: reformatted · axial · 3.3mm · 3.91mm/px · z∈[-870,+0]mm · 6 of 265 slices shown (1 of 2)]
[im 1/265]
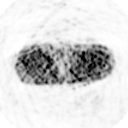
[im 53/265]
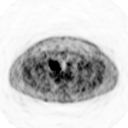
[im 106/265]
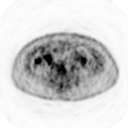
[im 159/265]
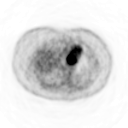
[im 212/265]
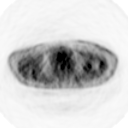
[im 265/265]
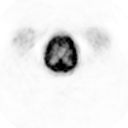

[Series 153: reformatted · coronal · 4.7mm · 6.98mm/px · 1 of 62 slices shown (2 of 2)]
[im 1/62]
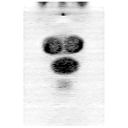

[25 of 25 positions shown; findings below may reference images not displayed]

FINDINGS: There are no hypermetabolic lymph nodes within the soft
tissues of the neck.

No hypermetabolic supraclavicular or axillary lymph nodes
identified.

No hypermetabolic mediastinal or hilar lymph nodes identified.

Progressive paramediastinal fibrosis and consolidation is
identified within the right lung.  There is associated mild to
moderate increased uptake within this area which is nonspecific but
likely reflecting inflammation secondary to external beam
radiation.  The focal, triangular-shaped area of paramediastinal
consolidation within the right upper lobe described on 07/06/2010
is again seen.  There is no significant FDG uptake within this area
of above that which is seen in the area  felt to likely represent
radiation change.

The spleen appears normal.

No abnormal uptake within the liver.

The adrenal glands are both normal.

The pancreas appears normal.

Status post colon the cystectomy.

The both of the adrenal glands are normal.

There is no hypermetabolic lymph nodes within the upper abdomen.

No hypermetabolic pelvic or inguinal lymph nodes identified.

There is no abnormal FDG uptake within the visualized axial or
appendicular skeleton.
IMPRESSION: 1.  Findings within the paramediastinal right lung are felt to
likely reflect evolutionary changes of external beam radiation.
Given the lack of significant FDG uptake above that which is seen
in the area of radiation change recurrent tumor is considered less
likely.  Close interval follow-up is recommended to ensure
stability of this area.  I would recommend the patient have a
follow-up examination of the chest and 3 months.
2.  No evidence for hypermetabolic tumor within the abdomen or
pelvis.

## 2011-06-19 ENCOUNTER — Emergency Department (HOSPITAL_COMMUNITY): Payer: Medicaid Other

## 2011-06-19 ENCOUNTER — Encounter (HOSPITAL_COMMUNITY): Payer: Self-pay | Admitting: Radiology

## 2011-06-19 ENCOUNTER — Inpatient Hospital Stay (HOSPITAL_COMMUNITY)
Admission: EM | Admit: 2011-06-19 | Discharge: 2011-06-23 | DRG: 313 | Disposition: A | Discharge status: Home / Self Care | Payer: Medicaid Other | Attending: Cardiology | Admitting: Cardiology

## 2011-06-19 DIAGNOSIS — C3490 Malignant neoplasm of unspecified part of unspecified bronchus or lung: Secondary | ICD-10-CM | POA: Diagnosis present

## 2011-06-19 DIAGNOSIS — Z79899 Other long term (current) drug therapy: Secondary | ICD-10-CM

## 2011-06-19 DIAGNOSIS — K219 Gastro-esophageal reflux disease without esophagitis: Secondary | ICD-10-CM | POA: Diagnosis present

## 2011-06-19 DIAGNOSIS — Z87891 Personal history of nicotine dependence: Secondary | ICD-10-CM

## 2011-06-19 DIAGNOSIS — R0789 Other chest pain: Principal | ICD-10-CM | POA: Diagnosis present

## 2011-06-19 DIAGNOSIS — E669 Obesity, unspecified: Secondary | ICD-10-CM | POA: Diagnosis present

## 2011-06-19 DIAGNOSIS — Z794 Long term (current) use of insulin: Secondary | ICD-10-CM

## 2011-06-19 DIAGNOSIS — J4489 Other specified chronic obstructive pulmonary disease: Secondary | ICD-10-CM | POA: Diagnosis present

## 2011-06-19 DIAGNOSIS — R079 Chest pain, unspecified: Secondary | ICD-10-CM

## 2011-06-19 DIAGNOSIS — E119 Type 2 diabetes mellitus without complications: Secondary | ICD-10-CM | POA: Diagnosis present

## 2011-06-19 DIAGNOSIS — J449 Chronic obstructive pulmonary disease, unspecified: Secondary | ICD-10-CM | POA: Diagnosis present

## 2011-06-19 DIAGNOSIS — I2510 Atherosclerotic heart disease of native coronary artery without angina pectoris: Secondary | ICD-10-CM | POA: Diagnosis present

## 2011-06-19 LAB — HEMOGLOBIN A1C
Hgb A1c MFr Bld: 6.9 % — ABNORMAL HIGH (ref <5.7)
Mean Plasma Glucose: 151 mg/dL — ABNORMAL HIGH (ref <117)

## 2011-06-19 LAB — CBC
Hemoglobin: 13.5 g/dL (ref 12.0–15.0)
MCH: 31.4 pg (ref 26.0–34.0)
Platelets: 152 10*3/uL (ref 150–400)
RBC: 4.3 MIL/uL (ref 3.87–5.11)
WBC: 5.9 10*3/uL (ref 4.0–10.5)

## 2011-06-19 LAB — BASIC METABOLIC PANEL
BUN: 11 mg/dL (ref 6–23)
Creatinine, Ser: 0.57 mg/dL (ref 0.50–1.10)
GFR calc Af Amer: >60 mL/min (ref >60)
GFR calc non Af Amer: >60 mL/min (ref >60)
Potassium: 3.8 mEq/L (ref 3.5–5.1)

## 2011-06-19 LAB — DIFFERENTIAL
Basophils Absolute: 0 10*3/uL (ref 0.0–0.1)
Basophils Relative: 0 % (ref 0–1)
Eosinophils Absolute: 0.2 10*3/uL (ref 0.0–0.7)
Monocytes Relative: 13 % — ABNORMAL HIGH (ref 3–12)
Neutro Abs: 3.7 10*3/uL (ref 1.7–7.7)
Neutrophils Relative %: 63 % (ref 43–77)

## 2011-06-19 LAB — PRO B NATRIURETIC PEPTIDE: Pro B Natriuretic peptide (BNP): 45.4 pg/mL (ref 0–125)

## 2011-06-19 LAB — GLUCOSE, CAPILLARY: Glucose-Capillary: 351 mg/dL — ABNORMAL HIGH (ref 70–99)

## 2011-06-19 LAB — POCT I-STAT TROPONIN I: Troponin i, poc: 0 ng/mL (ref 0.00–0.08)

## 2011-06-19 MED ORDER — IOHEXOL 300 MG/ML  SOLN
80.0000 mL | Freq: Once | INTRAMUSCULAR | Status: AC | PRN
  Administered 2011-06-19: 80 mL via INTRAVENOUS

## 2011-06-20 DIAGNOSIS — C3490 Malignant neoplasm of unspecified part of unspecified bronchus or lung: Secondary | ICD-10-CM

## 2011-06-20 DIAGNOSIS — R072 Precordial pain: Secondary | ICD-10-CM

## 2011-06-20 LAB — GLUCOSE, CAPILLARY
Glucose-Capillary: 176 mg/dL — ABNORMAL HIGH (ref 70–99)
Glucose-Capillary: 155 mg/dL — ABNORMAL HIGH (ref 70–99)
Glucose-Capillary: 135 mg/dL — ABNORMAL HIGH (ref 70–99)
Glucose-Capillary: 152 mg/dL — ABNORMAL HIGH (ref 70–99)

## 2011-06-20 LAB — CARDIAC PANEL(CRET KIN+CKTOT+MB+TROPI)
CK, MB: 2.8 ng/mL (ref 0.3–4.0)
CK, MB: 3 ng/mL (ref 0.3–4.0)
Relative Index: INVALID (ref 0.0–2.5)
Relative Index: INVALID (ref 0.0–2.5)
Total CK: 90 U/L (ref 7–177)
Troponin I: <0.3 ng/mL (ref <0.30)

## 2011-06-20 LAB — LIPID PANEL: Total CHOL/HDL Ratio: 5 RATIO

## 2011-06-21 LAB — GLUCOSE, CAPILLARY
Glucose-Capillary: 142 mg/dL — ABNORMAL HIGH (ref 70–99)
Glucose-Capillary: 203 mg/dL — ABNORMAL HIGH (ref 70–99)

## 2011-06-22 LAB — GLUCOSE, CAPILLARY
Glucose-Capillary: 140 mg/dL — ABNORMAL HIGH (ref 70–99)
Glucose-Capillary: 145 mg/dL — ABNORMAL HIGH (ref 70–99)
Glucose-Capillary: 155 mg/dL — ABNORMAL HIGH (ref 70–99)

## 2011-06-23 LAB — GLUCOSE, CAPILLARY: Glucose-Capillary: 136 mg/dL — ABNORMAL HIGH (ref 70–99)

## 2011-07-01 ENCOUNTER — Ambulatory Visit (INDEPENDENT_AMBULATORY_CARE_PROVIDER_SITE_OTHER): Payer: PRIVATE HEALTH INSURANCE | Admitting: Internal Medicine

## 2011-07-01 ENCOUNTER — Encounter: Payer: Self-pay | Admitting: Internal Medicine

## 2011-07-01 DIAGNOSIS — E119 Type 2 diabetes mellitus without complications: Secondary | ICD-10-CM

## 2011-07-01 DIAGNOSIS — Z23 Encounter for immunization: Secondary | ICD-10-CM

## 2011-07-01 DIAGNOSIS — E785 Hyperlipidemia, unspecified: Secondary | ICD-10-CM

## 2011-07-01 DIAGNOSIS — C3490 Malignant neoplasm of unspecified part of unspecified bronchus or lung: Secondary | ICD-10-CM

## 2011-07-01 DIAGNOSIS — Z79899 Other long term (current) drug therapy: Secondary | ICD-10-CM

## 2011-07-01 LAB — LIPID PANEL
HDL: 32.5 mg/dL — ABNORMAL LOW (ref >39.00)
Triglycerides: 312 mg/dL — ABNORMAL HIGH (ref 0.0–149.0)

## 2011-07-01 LAB — TSH: TSH: 0.49 u[IU]/mL (ref 0.35–5.50)

## 2011-07-01 NOTE — Assessment & Plan Note (Signed)
Lab Results  Component Value Date   CHOL 170 06/20/2011   CHOL  Value: 171        ATP III CLASSIFICATION:  <200     mg/dL   Desirable  784-696  mg/dL   Borderline High  >=295    mg/dL   High        2/84/1324   CHOL 178 08/25/2010   Lab Results  Component Value Date   HDL 34* 06/20/2011   HDL 35* 02/05/2011   HDL 28.90* 08/25/2010   Lab Results  Component Value Date   LDLCALC 95 06/20/2011   LDLCALC  Value: 121        Total Cholesterol/HDL:CHD Risk Coronary Heart Disease Risk Table                     Men   Women  1/2 Average Risk   3.4   3.3  Average Risk       5.0   4.4  2 X Average Risk   9.6   7.1  3 X Average Risk  23.4   11.0        Use the calculated Patient Ratio above and the CHD Risk Table to determine the patient's CHD Risk.        ATP III CLASSIFICATION (LDL):  <100     mg/dL   Optimal  401-027  mg/dL   Near or Above                    Optimal  130-159  mg/dL   Borderline  253-664  mg/dL   High  >403     mg/dL   Very High* 4/74/2595   LDLCALC 131* 07/10/2008   Lab Results  Component Value Date   TRIG 204* 06/20/2011   TRIG 74 02/05/2011   TRIG 345.0* 08/25/2010   Lab Results  Component Value Date   CHOLHDL 5.0 06/20/2011   CHOLHDL 4.9 02/05/2011   CHOLHDL 6 08/25/2010   Lab Results  Component Value Date   LDLDIRECT 108.2 08/25/2010   LDLDIRECT 147.5 04/27/2010   LDLDIRECT 176.1 01/22/2010    Needs followup

## 2011-07-01 NOTE — Assessment & Plan Note (Signed)
Lab Results  Component Value Date   HGBA1C 6.9* 06/19/2011   Reasonable control Continue meds

## 2011-07-01 NOTE — Assessment & Plan Note (Signed)
Has regular f/u with oncology 

## 2011-07-01 NOTE — Progress Notes (Signed)
  Subjective:    Patient ID: [name], female    DOB: 03-Jun-1946, 64 y.o.   MRN: [id number]  HPI    patient comes in for followup of multiple medical problems including type 2 diabetes, hyperlipidemia, hypertension. The patient does not check blood sugar or blood pressure at home. The patetient does not follow an exercise or diet program. The patient denies any polyuria, polydipsia.  In the past the patient has gone to diabetic treatment center. The patient is tolerating medications  Without difficulty. The patient does admit to medication compliance.   Recent hospitalization  Past Medical History  Diagnosis Date  . Diabetes mellitus   . Hyperlipidemia   . GERD (gastroesophageal reflux disease)   . Lung cancer 2010  . Lung cancer     lung ca dx 12/2010   Past Surgical History  Procedure Date  . Appendectomy   . Abdominal hysterectomy 1997  . Carpal tunnel release     reports that she quit smoking about 2 years ago. Her smoking use included Cigarettes. She does not have any smokeless tobacco history on file. Her alcohol and drug histories not on file. family history includes Diabetes in her brother, father, and mother; Heart disease in her brother; Heart failure in her mother; and Kidney disease in her mother. Allergies  Allergen Reactions  . Ibuprofen     REACTION: Anxiousness    Review of Systems  patient denies chest pain, shortness of breath, orthopnea. Denies lower extremity edema, abdominal pain, change in appetite, change in bowel movements. Patient denies rashes, musculoskeletal complaints. No other specific complaints in a complete review of systems.      Objective:   Physical Exam  Well-developed well-nourished female in no acute distress. HEENT exam atraumatic, normocephalic, extraocular muscles are intact. Neck is supple. No jugular venous distention no thyromegaly. Chest clear to auscultation without increased work of breathing. Cardiac exam S1 and S2 are  regular. Abdominal exam active bowel sounds, soft, nontender. Extremities no edema. Neurologic exam she is alert without any motor sensory deficits. Gait is normal.        Assessment & Plan:

## 2011-07-18 ENCOUNTER — Other Ambulatory Visit: Payer: Self-pay | Admitting: Internal Medicine

## 2011-07-18 ENCOUNTER — Telehealth: Payer: Self-pay | Admitting: *Deleted

## 2011-07-18 ENCOUNTER — Ambulatory Visit (INDEPENDENT_AMBULATORY_CARE_PROVIDER_SITE_OTHER)
Admission: RE | Admit: 2011-07-18 | Discharge: 2011-07-18 | Disposition: A | Discharge status: Home / Self Care | Payer: Medicare Other | Source: Ambulatory Visit | Attending: Internal Medicine | Admitting: Internal Medicine

## 2011-07-18 DIAGNOSIS — M25519 Pain in unspecified shoulder: Secondary | ICD-10-CM

## 2011-07-18 DIAGNOSIS — M25511 Pain in right shoulder: Secondary | ICD-10-CM

## 2011-07-18 NOTE — Telephone Encounter (Signed)
Pt's husband called stating that when he and his wife were here for their appts, she was complaining of Right shoulder pain, and it was never addressed.  She still has severe pain, and needs treatment.  No known injury and no xrays have been taken.

## 2011-07-18 NOTE — Telephone Encounter (Signed)
Check xray 

## 2011-07-18 NOTE — Telephone Encounter (Signed)
Notified pts husband.

## 2011-07-19 NOTE — H&P (Signed)
[name], [name] NO.:  [id number]  MEDICAL RECORD NO.:  [id number]  LOCATION:  3739                         FACILITY:  [facility]  PHYSICIAN:  Rachael Sidle, MD DATE OF BIRTH:  September 14, 1946  DATE OF ADMISSION:  06/19/2011 DATE OF DISCHARGE:                             HISTORY & PHYSICAL   DATE OF ADMISSION:  June 19, 2011.  PRIMARY CARDIOLOGIST:  Rachael Morton. Rachael Kill, MD, [name]  PRIMARY CARE PHYSICIAN:  Rachael Mole. Swords, MD  ONCOLOGIST:  Rachael Matte, MD at [facility].  HISTORY OF PRESENT ILLNESS:  This is a 65 year old obese Caucasian female, patient of [name], who has been lost to follow up since 2008.  She has a history of nonobstructive CAD per cardiac catheterization in 2006, stage III lung cancer status post chemo and radiation approximately 18 months ago.  She also has a history of diabetes and GERD.  The patient was admitted to the ER after she awoke with sudden onset of chest pressure, feeling like someone was sitting on her chest.  When she tried to take a deep breaths, she had difficulty doing so.  She did have associated shortness of breath with movement. She woke her husband.  This occurred around 6 a.m., who called EMS and she was told to take baby aspirin while they were en route.  EMS arrived approximately 6:30 and she was brought to the emergency room.  By the time she arrived to the emergency room, she was pain free without any intervention.  The patient has been placed in CDU on oxygen and cardiac enzymes initially has been cycled to be negative.  All other labs were found to be normal.  EKG did not reveal any acute EKG changes.  We were asked to see the patient by the ER physician for this sudden onset of chest pain.  REVIEW OF SYSTEMS:  Positive for chest pain and shortness of breath, sudden onset.  No other associated symptoms.  No nausea, vomiting, dizziness, or diaphoresis.  All other systems were reviewed  and found to be negative unless listed above.  CODE STATUS:  Full code.  PAST MEDICAL HISTORY: 1. COPD. 2. Diabetes. 3. Small cell lung cancer, stage III of the right lung status post     chemo and radiation in 6 months' duration, but has been free of any     treatment for greater than 1 year. 4. GERD. 5. Nonobstructive CAD.     a.     Cardiac catheterization in 2006, with preserved LV function,      scattered coronary irregularities without critical stenosis.     b.     Status post stress echo in 2008, which was negative for      ischemia, but did reveal hypertensive response.  SOCIAL HISTORY:  She lives in [place] with her husband and daughter. She is a 45-pack-year smoker, but quit 2 years ago.  Negative EtOH or drug use.  FAMILY HISTORY:  Mother deceased from CHF with a history of CAD.  Father deceased from unknown cause.  She has 4 brothers, 3 of which have deceased from an MI and one brother who is alive  who has had an MI.  CURRENT MEDICATIONS: 1. Humulin 70/30 20 units subcu b.i.d. 2. Simvastatin 10 mg daily. 3. Loratadine 10 mg daily.  ALLERGIES:  IBUPROFEN causing anxiety.  CURRENT LABS:  Sodium 140, potassium 3.8, chloride 106, CO2 27, BUN 11, creatinine 0.57, glucose 132.  Hemoglobin 13.5, hematocrit 38.7, white blood cells 5.9, platelets 152.  Troponin 0.02, calcium 9.2, D-dimer 0.95.  X-RAYS: 1. Chest x-ray revealing post-treatment changes in the medial right     upper lung, appeared to have progressed along the inferior margin,     tumor progression is a concern. 2. CT scan of the chest revealing a more confluent soft tissue density     in the right upper lobe.  PHYSICAL EXAMINATION:  VITAL SIGNS:  Blood pressure 116/85, pulse 94, respirations 20, temperature 97.7, O2 sat 99% on 2 liters. GENERAL:  She is awake, alert, and oriented, in no acute distress. HEENT:  Head is normocephalic and atraumatic.  Eyes, PERRLA. NECK:  Supple without JVD or carotid  bruits. CARDIOVASCULAR:  Regular rate and rhythm with 1/6 systolic murmur auscultated.  Pulses are 2+ and equal without bruits. LUNGS:  Clear to auscultation without wheezes, rales, or rhonchi. ABDOMEN:  Soft, nontender, obese. EXTREMITIES:  Without clubbing, cyanosis, or edema. MUSCULOSKELETAL:  No deformity.  She does have some mild kyphosis. NEUROLOGIC:  Cranial nerves II-XII are grossly intact.  IMPRESSION: 1. Chest pain with associated shortness of breath, nonradiating with     no associated symptoms.  She is now pain free.  She did have a     cardiac catheterization in 2006, which revealed nonobstructive     coronary artery disease with a stress echo in 2008, which was     negative for ischemia, but did reveal a hypertensive response.  She     did have a positive D-dimer, but CT scan of the chest ruled out     pulmonary embolism.  She is now pain free.  We will admit to     observation to rule out myocardial infarction.  Consider stress     test as an inpatient or an outpatient per [name] on this     followup 2. Small-cell lung cancer status post radiation and chemo greater than1 year ago.  She is followed by [name] and sees him every 4     months.  She was told by [name] that she is currently cancer     free. 3. Diabetes.  Currently controlled under Humulin insulin 70/30.  She     will be placed on carbohydrate-modified diet with blood sugars     evaluated.  We will follow the patient throughout hospitalization.     If she is ruled out, she will be discharged and follow up with Dr.     Riley [name].     Rachael Mare. Lyman Bishop, NP   ______________________________ Rachael Sidle, MD    KML/MEDQ  D:  06/19/2011  T:  06/19/2011  Job:  [id number]  cc:   [name], M.D. Bruce Rexene Edison Swords, MD  Electronically Signed by Rachael Reining NP on 07/12/2011 04:49:18 PM Electronically Signed by Rachael Dell MD on 07/19/2011 08:38:39 AM

## 2011-07-22 ENCOUNTER — Telehealth: Payer: Self-pay | Admitting: Internal Medicine

## 2011-07-22 DIAGNOSIS — M25511 Pain in right shoulder: Secondary | ICD-10-CM

## 2011-07-22 NOTE — Telephone Encounter (Signed)
Pt called and said that she would like the nurse to call her about therapy that was discussed yesterday.

## 2011-07-22 NOTE — Telephone Encounter (Signed)
Pt is requesting PT to right shoulder and order was sent to [facility].

## 2011-07-27 ENCOUNTER — Ambulatory Visit: Payer: Medicare Other | Attending: Internal Medicine

## 2011-07-27 DIAGNOSIS — IMO0001 Reserved for inherently not codable concepts without codable children: Secondary | ICD-10-CM | POA: Insufficient documentation

## 2011-07-27 DIAGNOSIS — M542 Cervicalgia: Secondary | ICD-10-CM | POA: Insufficient documentation

## 2011-07-27 DIAGNOSIS — R5381 Other malaise: Secondary | ICD-10-CM | POA: Insufficient documentation

## 2011-07-27 DIAGNOSIS — M25519 Pain in unspecified shoulder: Secondary | ICD-10-CM | POA: Insufficient documentation

## 2011-07-29 ENCOUNTER — Ambulatory Visit: Payer: Medicare Other | Admitting: Physical Therapy

## 2011-08-04 ENCOUNTER — Ambulatory Visit: Payer: Medicare Other

## 2011-08-04 LAB — I-STAT 8, (EC8 V) (CONVERTED LAB)
Acid-base deficit: 1
Hemoglobin: 15
Operator id: 294511
Potassium: 3.6
Sodium: 140
TCO2: 25

## 2011-08-04 LAB — POCT I-STAT CREATININE
Creatinine, Ser: 0.7
Operator id: 294511

## 2011-08-04 LAB — CK TOTAL AND CKMB (NOT AT ARMC)
CK, MB: 2
Total CK: 90

## 2011-08-04 LAB — POCT CARDIAC MARKERS
CKMB, poc: <1 — ABNORMAL LOW
Myoglobin, poc: 81.2

## 2011-08-04 LAB — CARDIAC PANEL(CRET KIN+CKTOT+MB+TROPI)
Total CK: 74
Total CK: 75

## 2011-08-04 LAB — CBC
HCT: 42.1
Hemoglobin: 14.6
MCHC: 34.5
RDW: 13.6

## 2011-08-04 LAB — DIFFERENTIAL
Basophils Absolute: 0
Basophils Relative: 0
Eosinophils Relative: 2
Monocytes Absolute: 0.7
Neutro Abs: 4.3

## 2011-08-04 LAB — LIPID PANEL
Cholesterol: 164
LDL Cholesterol: 100 — ABNORMAL HIGH
VLDL: 39

## 2011-08-04 LAB — TROPONIN I: Troponin I: 0.01

## 2011-08-04 NOTE — Discharge Summary (Signed)
[name], [name]               ACCOUNT NO.:  [id number]  MEDICAL RECORD NO.:  [id number]  LOCATION:  3739                         FACILITY:  [facility]  PHYSICIAN:  [name], MD, FACCDATE OF BIRTH:  [birth year]  DATE OF ADMISSION:  06/19/2011 DATE OF DISCHARGE:  06/23/2011                              DISCHARGE SUMMARY   PRIMARY CARDIOLOGIST:  Maisie Fus D. [name], MD, [facility].  PRIMARY CARE PROVIDER:  [name], MD.  PRIMARY ONCOLOGIST:  [name], MD.  DISCHARGE DIAGNOSIS:  Chest pain without objective evidence of ischemia.  SECONDARY DIAGNOSES: 1. Stage III small-cell lung cancer with progressive postradiation     changes of the medial right upper chest noted on CT this admission. 2. Chronic obstructive pulmonary disease. 3. Diabetes mellitus. 4. Gastroesophageal reflux disease. 5. History of nonobstructive coronary artery disease.  ALLERGIES:  IBUPROFEN.  PROCEDURES: 1. CT angiography of the chest performed June 19, 2011 revealing no     evidence of pulmonary emboli or acute abnormality.  She has     progressive probable postradiation therapy changes in the medial     right upper chest with no discrete mass visualized.  Recommendation     for PET CT. 2. Dobutamine echocardiogram performed June 22, 2011 which showed no     evidence of pharmacologic induced ischemia with an EF fo 75%.  HISTORY OF PRESENT ILLNESS:  A 65 year old female with prior history of nonobstructive coronary artery disease by catheterization in 2006, who was in usual state of health until the morning of admission when she awoke with substernal chest pressure and dyspnea.  EMS was called and the patient was taken to [facility] ED where cardiac markers were negative and ECG was nonacute.  She did have elevation of a D-dimer of 0.95 and she subsequently underwent chest CT while still in the ER which showed no evidence of pulmonary embolus or other acute abnormality, but did  show progressive probable postradiation therapy changes in the medial right upper chest with no discrete mass visualized.  A PET CT was recommended.  The patient admitted to Cardiology for further evaluation of chest pain.  HOSPITAL COURSE:  The patient ruled out for MI.  In light of her CT findings regarding her lung cancer, Pulmonology was consulted and the patient met with Dr. Delton Coombes, who recommended follow-up with Dr. Si Gaul sooner rather than later, as well as a PET CT in the outpatient setting.  This has been discussed with Dr. Arbutus Ped and his office will contact the patient for follow-up within the next few weeks.  The patient had no further episodes of chest discomfort, and in light of ruling out for MI, she underwent dobutamine echocardiogram on August 29 which showed no evidence of ischemia and normal wall motion and normal LV function.  As such, she felt to be stable for discharge today.  DISCHARGE LABS:  Hemoglobin 13.5, hematocrit 38.7, WBC 5.9, platelets 152, D-dimer 0.95.  Sodium 140, potassium 3.8, chloride 106, CO2 27, BUN 11, creatinine 0.57, glucose 132, calcium 9.2.  Hemoglobin A1c 6.9.  CK 90, MB 3.0, troponin-I less than 0.30.  Total cholesterol 170, triglycerides 204, HDL 34, LDL 95.  TSH 0.346.  DISPOSITION:  The patient will be discharged home today in good condition.  FOLLOWUP PLANS AND APPOINTMENTS:  The patient will follow up with Dr. Arbutus Ped as arranged by the Oncology office.  The patient will follow up with primary care provider and Dr. [name] as previously scheduled.  DISCHARGE MEDICATIONS: 1. Nitroglycerin 0.4 mg sublingual p.r.n. chest pain. 2. Aspirin 81 mg daily. 3. Insulin 70/30, 20 units b.i.d. 4. Loratadine 10 mg daily. 5. Simvastatin 10 mg at bedtime.  OUTSTANDING LAB STUDIES:  The patient should have additional thyroid function testing in the outpatient setting given mildly depressed TSH during hospitalization.  DURATION OF  DISCHARGE ENCOUNTER:  40 minutes including physician time.     [name] Rachael, ANP   ______________________________ [name], MD, [facility]: [name] Rachael W    CB/MEDQ  D:  06/23/2011  T:  06/23/2011  Job:  [id number]  cc:   [name], MD [name], M.D.  Electronically Signed by [name] Rachael ANP on 07/07/2011 03:17:42 PM Electronically Signed by Shawnie Pons MD [facility] on 08/04/2011 05:41:06 AM

## 2011-08-10 ENCOUNTER — Ambulatory Visit: Payer: Medicare Other

## 2011-08-17 ENCOUNTER — Encounter (HOSPITAL_BASED_OUTPATIENT_CLINIC_OR_DEPARTMENT_OTHER): Payer: Medicare Other | Admitting: Internal Medicine

## 2011-08-17 ENCOUNTER — Other Ambulatory Visit: Payer: Self-pay | Admitting: Internal Medicine

## 2011-08-17 ENCOUNTER — Ambulatory Visit (HOSPITAL_COMMUNITY)
Admission: RE | Admit: 2011-08-17 | Discharge: 2011-08-17 | Disposition: A | Discharge status: Home / Self Care | Payer: Medicare Other | Source: Ambulatory Visit | Attending: Internal Medicine | Admitting: Internal Medicine

## 2011-08-17 ENCOUNTER — Other Ambulatory Visit (HOSPITAL_COMMUNITY): Payer: Self-pay

## 2011-08-17 DIAGNOSIS — Z9221 Personal history of antineoplastic chemotherapy: Secondary | ICD-10-CM | POA: Insufficient documentation

## 2011-08-17 DIAGNOSIS — Z923 Personal history of irradiation: Secondary | ICD-10-CM | POA: Insufficient documentation

## 2011-08-17 DIAGNOSIS — C3410 Malignant neoplasm of upper lobe, unspecified bronchus or lung: Secondary | ICD-10-CM

## 2011-08-17 DIAGNOSIS — C3490 Malignant neoplasm of unspecified part of unspecified bronchus or lung: Secondary | ICD-10-CM

## 2011-08-17 LAB — CBC WITH DIFFERENTIAL/PLATELET
Basophils Absolute: 0 10*3/uL (ref 0.0–0.1)
Eosinophils Absolute: 0.1 10*3/uL (ref 0.0–0.5)
HGB: 13.5 g/dL (ref 11.6–15.9)
MCV: 92.8 fL (ref 79.5–101.0)
MONO#: 0.5 10*3/uL (ref 0.1–0.9)
MONO%: 8.7 % (ref 0.0–14.0)
NEUT#: 4.4 10*3/uL (ref 1.5–6.5)
RDW: 14.7 % — ABNORMAL HIGH (ref 11.2–14.5)
WBC: 5.9 10*3/uL (ref 3.9–10.3)
lymph#: 0.9 10*3/uL (ref 0.9–3.3)

## 2011-08-17 LAB — CMP (CANCER CENTER ONLY)
Albumin: 3.5 g/dL (ref 3.3–5.5)
BUN, Bld: 11 mg/dL (ref 7–22)
CO2: 31 mEq/L (ref 18–33)
Calcium: 8.4 mg/dL (ref 8.0–10.3)
Chloride: 101 mEq/L (ref 98–108)
Glucose, Bld: 275 mg/dL — ABNORMAL HIGH (ref 73–118)
Potassium: 4.1 mEq/L (ref 3.3–4.7)
Sodium: 145 mEq/L (ref 128–145)
Total Protein: 7.2 g/dL (ref 6.4–8.1)

## 2011-08-17 MED ORDER — IOHEXOL 300 MG/ML  SOLN: 80.0000 mL | Freq: Once | INTRAMUSCULAR | Status: AC | PRN

## 2011-08-23 IMAGING — CT CT CHEST W/ CM
1 of 2 series · 14 of 30 positions shown, 18 images · IV contrast (agent unspecified)
Comparison: P E T of 07/26/2010.  Chest CT of 07/06/2010.

CLINICAL DATA: Lung cancer.  Prior chemotherapy and radiation
therapy complete.  No current complaints.

CT CHEST WITH CONTRAST
TECHNIQUE: Multidetector CT imaging of the chest was performed
following the standard protocol during bolus administration of
intravenous contrast.
Contrast: 80 ml [id number]-O22

[Series 2: rtn chest with st · axial · 0.78mm/px · z∈[+1488,+1728]mm · 14 of 57 slices shown, 18 images]
[im 5/57  mediastinal]
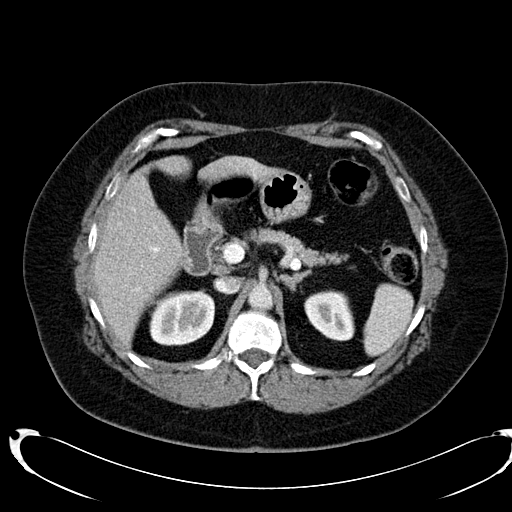
[im 5/57  lung]
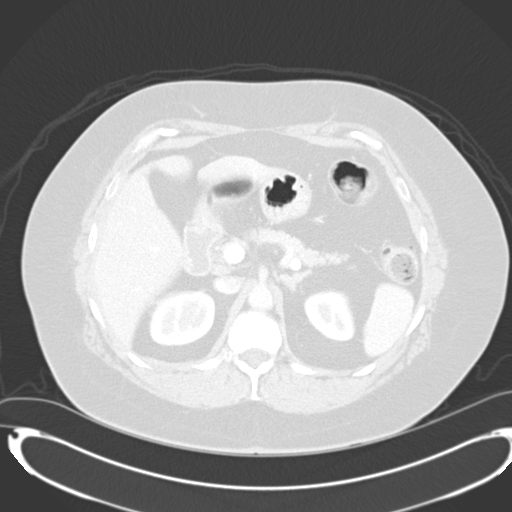
[im 9/57  lung]
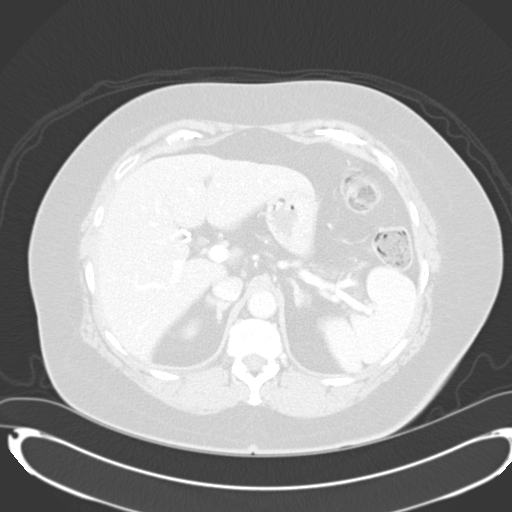
[im 13/57  lung]
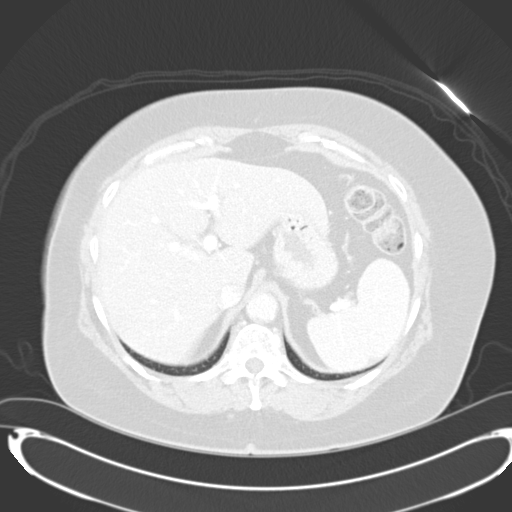
[im 17/57  lung]
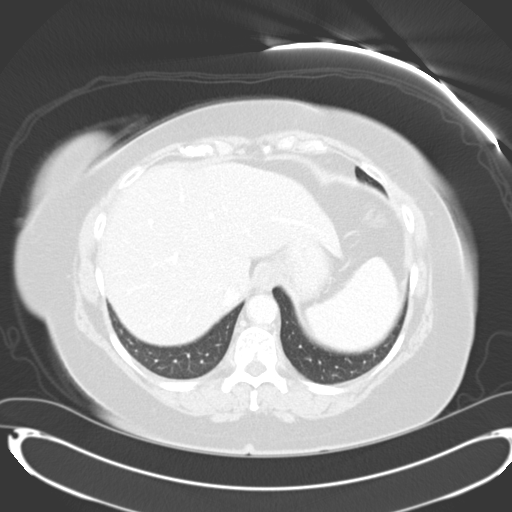
[im 21/57  mediastinal]
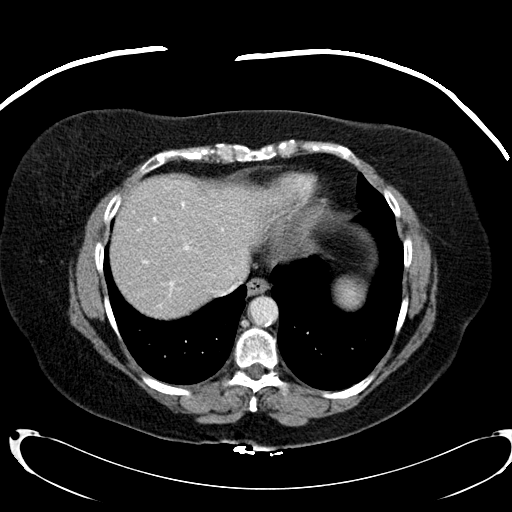
[im 21/57  lung]
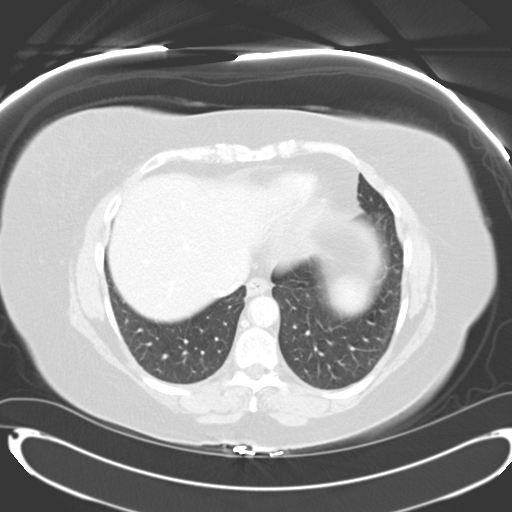
[im 25/57  lung]
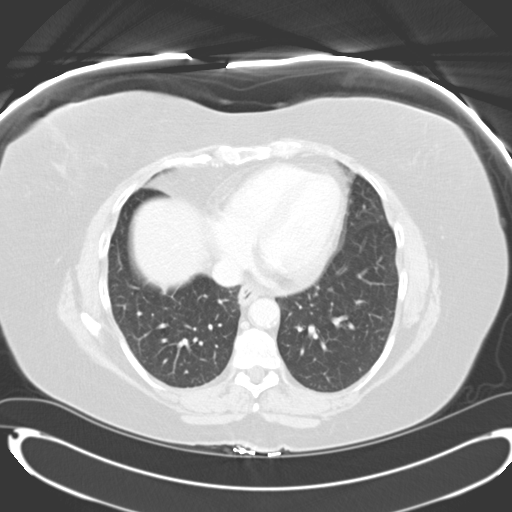
[im 28/57  lung]
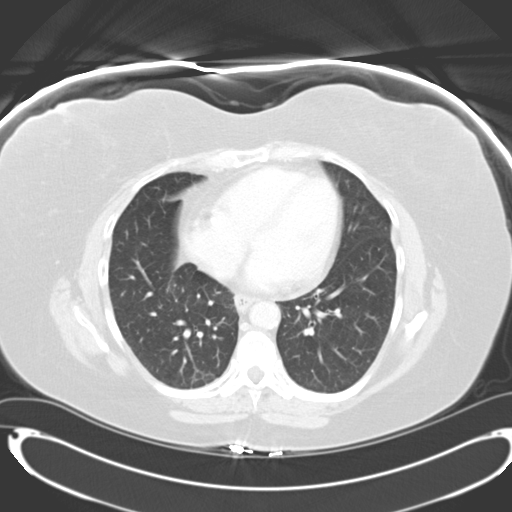
[im 29/57  lung]
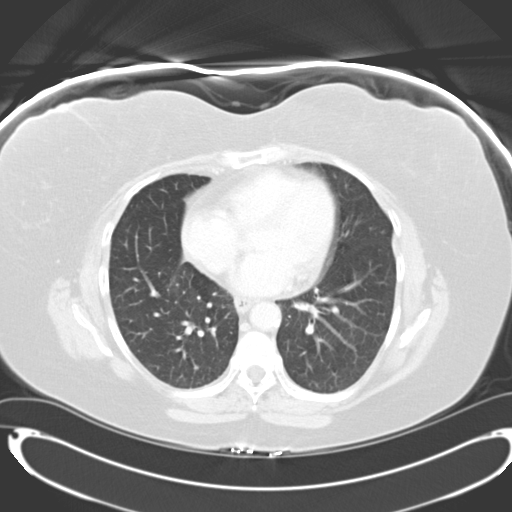
[im 33/57  mediastinal]
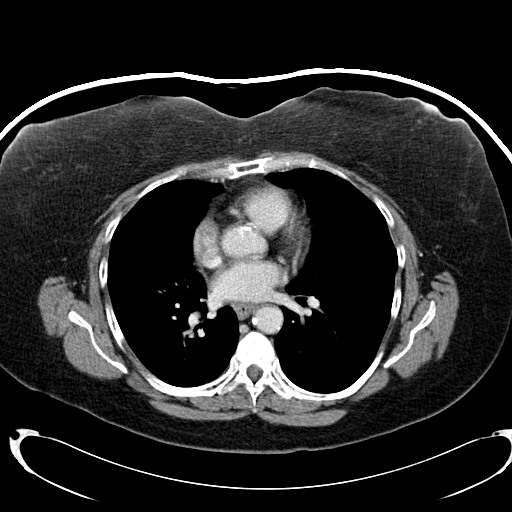
[im 33/57  lung]
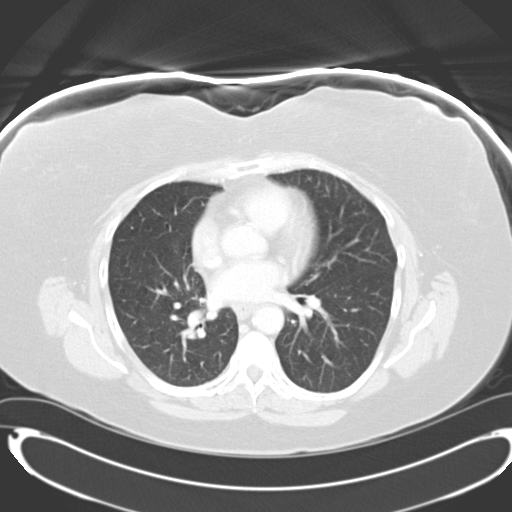
[im 37/57  lung]
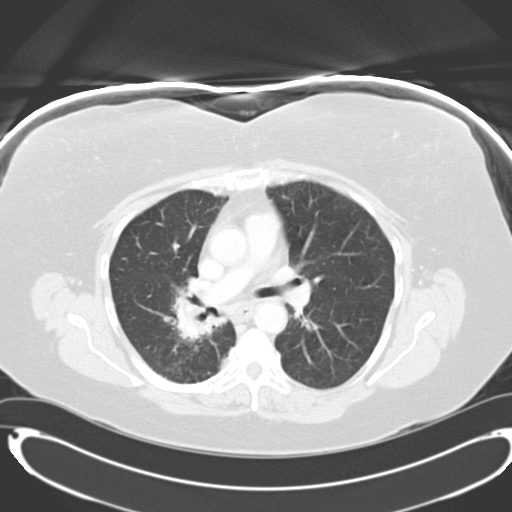
[im 41/57  lung]
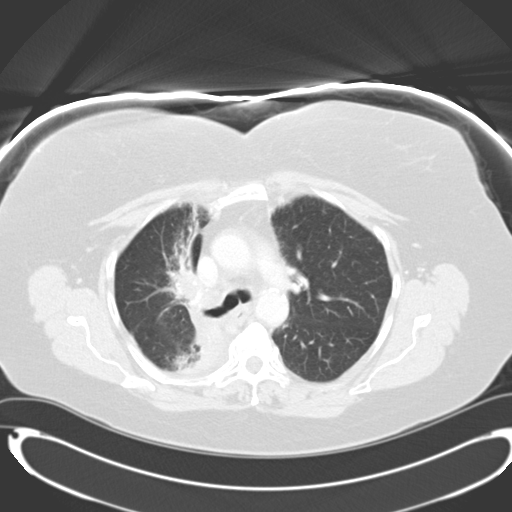
[im 45/57  lung]
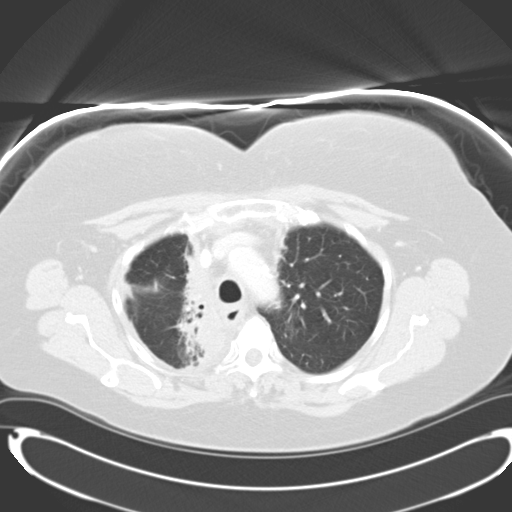
[im 49/57  mediastinal]
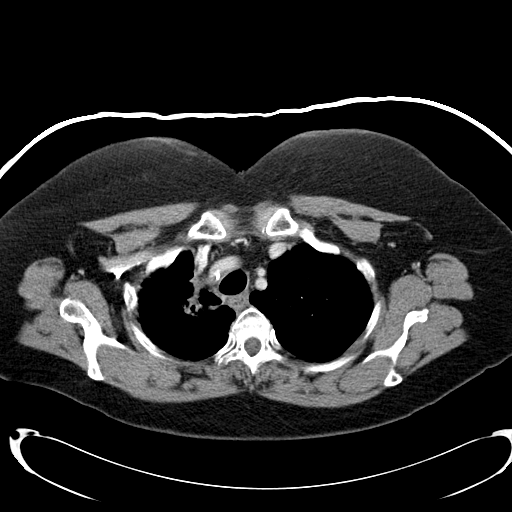
[im 49/57  lung]
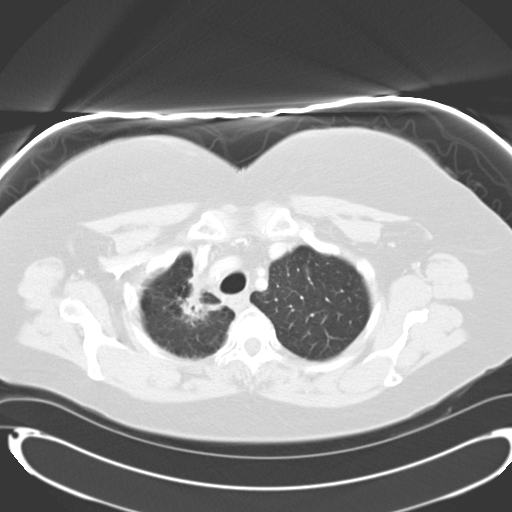
[im 53/57  lung]
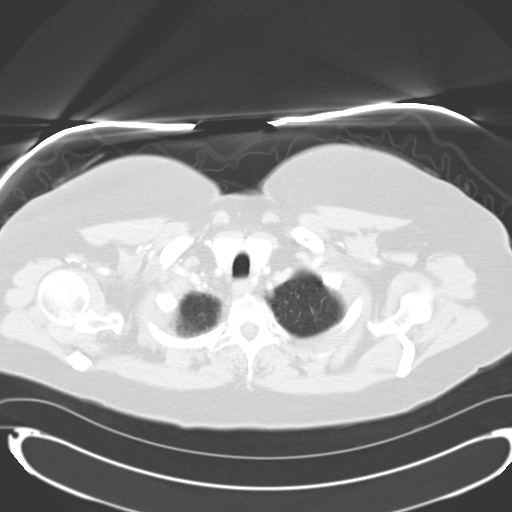

[14 of 30 positions shown; findings below may reference images not displayed]

FINDINGS: Lung windows demonstrate stable 4 mm right apical lung
nodule on image 6.  2 mm posterolateral right upper lobe lung
nodule described on the prior exam is not visualized.

Redemonstration of probable radiation fibrosis within the
paramediastinal right lung.  The area of the questioned recurrent
tumor on the prior exam is less mass-like today.  2.0 cm thickness
on image 13 versus 2.2 cm at the same level on the prior.  Along
the right major fissure on image 18, an area of more focal soft
tissue density without air bronchograms is identified.  This
focality is new since the prior exam.

Soft tissue windows demonstrate too small to characterize lesions
within the thyroid.  Borderline cardiomegaly with resolution of
pericardial effusion.  No pleural fluid
. No mediastinal or hilar adenopathy.

Limited abdominal imaging demonstrates cholecystectomy. Normal
adrenal glands.  Too small to characterize left renal lesion which
is likely a cyst. No acute osseous abnormality.
IMPRESSION: 1.  Redemonstration of radiation changes within the right
hemithorax without convincing evidence of recurrent or residual
disease.
2.  An area of more focal soft tissue density in the posterior
portion of this presumed radiation change is slightly more
confluent today.  Favored to represent evolving radiation change.
This warrants followup attention to exclude less likely recurrent
disease.

## 2011-08-24 ENCOUNTER — Other Ambulatory Visit: Payer: Self-pay | Admitting: Internal Medicine

## 2011-08-24 ENCOUNTER — Encounter (HOSPITAL_BASED_OUTPATIENT_CLINIC_OR_DEPARTMENT_OTHER): Payer: Medicare Other | Admitting: Internal Medicine

## 2011-08-24 DIAGNOSIS — C3490 Malignant neoplasm of unspecified part of unspecified bronchus or lung: Secondary | ICD-10-CM

## 2011-08-24 DIAGNOSIS — C3410 Malignant neoplasm of upper lobe, unspecified bronchus or lung: Secondary | ICD-10-CM

## 2011-09-05 ENCOUNTER — Other Ambulatory Visit: Payer: Self-pay | Admitting: Internal Medicine

## 2011-10-02 ENCOUNTER — Encounter (HOSPITAL_COMMUNITY): Payer: Self-pay | Admitting: *Deleted

## 2011-10-02 ENCOUNTER — Emergency Department (HOSPITAL_COMMUNITY): Payer: Medicare Other

## 2011-10-02 ENCOUNTER — Emergency Department (HOSPITAL_COMMUNITY)
Admission: EM | Admit: 2011-10-02 | Discharge: 2011-10-02 | Disposition: A | Discharge status: Home / Self Care | Payer: Medicare Other | Attending: Emergency Medicine | Admitting: Emergency Medicine

## 2011-10-02 DIAGNOSIS — K219 Gastro-esophageal reflux disease without esophagitis: Secondary | ICD-10-CM | POA: Insufficient documentation

## 2011-10-02 DIAGNOSIS — E785 Hyperlipidemia, unspecified: Secondary | ICD-10-CM | POA: Insufficient documentation

## 2011-10-02 DIAGNOSIS — E119 Type 2 diabetes mellitus without complications: Secondary | ICD-10-CM | POA: Insufficient documentation

## 2011-10-02 DIAGNOSIS — R1012 Left upper quadrant pain: Secondary | ICD-10-CM | POA: Insufficient documentation

## 2011-10-02 DIAGNOSIS — R0602 Shortness of breath: Secondary | ICD-10-CM | POA: Insufficient documentation

## 2011-10-02 DIAGNOSIS — R10812 Left upper quadrant abdominal tenderness: Secondary | ICD-10-CM | POA: Insufficient documentation

## 2011-10-02 DIAGNOSIS — Z85118 Personal history of other malignant neoplasm of bronchus and lung: Secondary | ICD-10-CM | POA: Insufficient documentation

## 2011-10-02 DIAGNOSIS — Z79899 Other long term (current) drug therapy: Secondary | ICD-10-CM | POA: Insufficient documentation

## 2011-10-02 DIAGNOSIS — K5732 Diverticulitis of large intestine without perforation or abscess without bleeding: Secondary | ICD-10-CM | POA: Insufficient documentation

## 2011-10-02 DIAGNOSIS — K5792 Diverticulitis of intestine, part unspecified, without perforation or abscess without bleeding: Secondary | ICD-10-CM

## 2011-10-02 LAB — URINALYSIS, ROUTINE W REFLEX MICROSCOPIC
Glucose, UA: 500 mg/dL — AB
Ketones, ur: NEGATIVE mg/dL
Leukocytes, UA: NEGATIVE
pH: 5.5 (ref 5.0–8.0)

## 2011-10-02 LAB — GLUCOSE, CAPILLARY: Glucose-Capillary: 195 mg/dL — ABNORMAL HIGH (ref 70–99)

## 2011-10-02 MED ORDER — CIPROFLOXACIN HCL 500 MG PO TABS
500.0000 mg | ORAL_TABLET | Freq: Once | ORAL | Status: AC
  Administered 2011-10-02: 500 mg via ORAL
  Filled 2011-10-02: qty 1

## 2011-10-02 MED ORDER — CIPROFLOXACIN HCL 500 MG PO TABS: 500.0000 mg | ORAL_TABLET | Freq: Two times a day (BID) | ORAL | Status: AC

## 2011-10-02 MED ORDER — SODIUM CHLORIDE 0.9 % IV SOLN
INTRAVENOUS | Status: DC
  Administered 2011-10-02: 11:00:00 via INTRAVENOUS

## 2011-10-02 MED ORDER — METRONIDAZOLE 500 MG PO TABS: 500.0000 mg | ORAL_TABLET | Freq: Three times a day (TID) | ORAL | Status: AC

## 2011-10-02 MED ORDER — PROMETHAZINE HCL 25 MG PO TABS: 25.0000 mg | ORAL_TABLET | Freq: Four times a day (QID) | ORAL | Status: AC | PRN

## 2011-10-02 MED ORDER — MORPHINE SULFATE 4 MG/ML IJ SOLN
4.0000 mg | Freq: Once | INTRAMUSCULAR | Status: AC
  Administered 2011-10-02: 4 mg via INTRAVENOUS
  Filled 2011-10-02: qty 1

## 2011-10-02 MED ORDER — ONDANSETRON HCL 4 MG/2ML IJ SOLN
4.0000 mg | Freq: Once | INTRAMUSCULAR | Status: AC
  Administered 2011-10-02: 4 mg via INTRAVENOUS
  Filled 2011-10-02: qty 2

## 2011-10-02 MED ORDER — METRONIDAZOLE 500 MG PO TABS
500.0000 mg | ORAL_TABLET | Freq: Once | ORAL | Status: AC
  Administered 2011-10-02: 500 mg via ORAL
  Filled 2011-10-02: qty 1

## 2011-10-02 MED ORDER — HYDROCODONE-ACETAMINOPHEN 5-325 MG PO TABS: 2.0000 | ORAL_TABLET | Freq: Four times a day (QID) | ORAL | Status: AC | PRN

## 2011-10-02 NOTE — ED Notes (Signed)
To ed for eval of llq pain since this am. Pain increases with ambulation. No diarrhea, no vomiting, no pain with urination, no fevers. Bowel sounds present and wnl in all 4 quads.

## 2011-10-02 NOTE — ED Notes (Signed)
CBG results: 195

## 2011-10-02 NOTE — ED Provider Notes (Signed)
History     CSN: [id number] Arrival date & time: 10/02/2011 10:06 AM   First MD Initiated Contact with Patient 10/02/11 1021      Chief Complaint  Patient presents with  . Abdominal Pain    (Consider location/radiation/quality/duration/timing/severity/associated sxs/prior treatment) HPI  She presents with her husband. Patient is a reluctant historian. She relates while they were eating breakfast at 845 am today she started getting some left upper quadrant pain that got worse today got to church  after 9. She denies nausea or vomiting or fever. She states she's had a cough but husband says she's coughed for a year and that is chronic. She states she feels some shortness of breath. She denies hematuria or frequency. She states she's had a loss of appetite, however husband states that's been on and off for a long time. She relates most recently then for couple days. She denies having this pain before however husband states she is told him she's had off and on for couple days. She states leaning to the left makes it hurt more and nothing else makes it hurt worse, however when I sit her up change in position is very uncomfortable for her. She denies anything making it feel better. He states her pain is currently in a 8 out of 10 and was a 10 out of 10 earlier. He denies any change in activity or trauma.  Primary  care physician Dr. Cato Mulligan Oncologist after [facility]   Past Medical History  Diagnosis Date  . Diabetes mellitus   . Hyperlipidemia   . GERD (gastroesophageal reflux disease)   . Lung cancer 2010  . Lung cancer     lung ca dx 12/2010   small cell cancer with radiation and chemotherapy  Past Surgical History  Procedure Date  . Appendectomy   . Abdominal hysterectomy 1997  . Carpal tunnel release     Family History  Problem Relation Age of Onset  . Heart failure Mother   . Kidney disease Mother     renal failure  . Diabetes Mother   . Diabetes Father   . Diabetes Brother    . Heart disease Brother    kidney stones  History  Substance Use Topics  . Smoking status: Former Smoker    Types: Cigarettes    Quit date: 10/24/2008  . Smokeless tobacco: Not on file  . Alcohol Use: No   lives with spouse  OB History    Grav Para Term Preterm Abortions TAB SAB Ect Mult Living                  Review of Systems  All other systems reviewed and are negative.    Allergies  Ibuprofen  Home Medications   Current Outpatient Rx  Name Route Sig Dispense Refill  . HUMULIN 70/30 Kersey Subcutaneous Inject 20 Units into the skin 2 (two) times daily. Takes 20 units twice daily    . LORATADINE 10 MG PO TABS Oral Take 10 mg by mouth daily.      Marland Kitchen NITROGLYCERIN 0.4 MG SL SUBL Sublingual Place 0.4 mg under the tongue every 5 (five) minutes as needed.      Marland Kitchen SIMVASTATIN 10 MG PO TABS  TAKE ONE TABLET BY MOUTH AT BEDTIME 30 tablet 5  . CIPROFLOXACIN HCL 500 MG PO TABS Oral Take 1 tablet (500 mg total) by mouth 2 (two) times daily. 20 tablet 0  . HYDROCODONE-ACETAMINOPHEN 5-325 MG PO TABS Oral Take 2 tablets by mouth  every 6 (six) hours as needed for pain. 16 tablet 0  . METRONIDAZOLE 500 MG PO TABS Oral Take 1 tablet (500 mg total) by mouth 3 (three) times daily. 30 tablet 0  . PROMETHAZINE HCL 25 MG PO TABS Oral Take 1 tablet (25 mg total) by mouth every 6 (six) hours as needed for nausea. 8 tablet 0    BP 118/69  Pulse 91  Temp(Src) 97.9 F (36.6 C) (Oral)  Resp 16  SpO2 96%  Vital signs normal  Physical Exam  Nursing note and vitals reviewed. Constitutional: She is oriented to person, place, and time. She appears well-developed and well-nourished.  Non-toxic appearance. She does not appear ill. No distress.       She appears painful when she changes positions  HENT:  Head: Normocephalic and atraumatic.  Right Ear: External ear normal.  Left Ear: External ear normal.  Nose: Nose normal. No mucosal edema or rhinorrhea.  Mouth/Throat: Oropharynx is clear and  moist and mucous membranes are normal. No dental abscesses or uvula swelling.  Eyes: Conjunctivae and EOM are normal. Pupils are equal, round, and reactive to light.  Neck: Normal range of motion and full passive range of motion without pain. Neck supple.  Cardiovascular: Normal rate, regular rhythm and normal heart sounds.  Exam reveals no gallop and no friction rub.   No murmur heard. Pulmonary/Chest: Effort normal and breath sounds normal. No respiratory distress. She has no wheezes. She has no rhonchi. She has no rales. She exhibits no tenderness and no crepitus.  Abdominal: Soft. Normal appearance and bowel sounds are normal. She exhibits no distension. There is tenderness. There is no rebound and no guarding.       Patient has tenderness in her left upper quadrant without guarding or rebound  Genitourinary:       Patient has tenderness to percussion of her left kidney, but not her right  Musculoskeletal: Normal range of motion. She exhibits no edema and no tenderness.       Moves all extremities well.   Neurological: She is alert and oriented to person, place, and time. She has normal strength. No cranial nerve deficit.  Skin: Skin is warm, dry and intact. No rash noted. No erythema. No pallor.  Psychiatric: Her speech is normal and behavior is normal. Her mood appears not anxious.       Affect flat    ED Course  Procedures (including critical care time)  Patient given IV fluids IV pain and nausea medicine she appears much more comfortable. She was given her first oral dose of antibiotics in the ED.  Results for orders placed during the hospital encounter of 10/02/11  URINALYSIS, ROUTINE W REFLEX MICROSCOPIC      Component Value Range   Color, Urine YELLOW  YELLOW    APPearance CLEAR  CLEAR    Specific Gravity, Urine 1.011  1.005 - 1.030    pH 5.5  5.0 - 8.0    Glucose, UA 500 (*) NEGATIVE (mg/dL)   Hgb urine dipstick NEGATIVE  NEGATIVE    Bilirubin Urine NEGATIVE  NEGATIVE     Ketones, ur NEGATIVE  NEGATIVE (mg/dL)   Protein, ur NEGATIVE  NEGATIVE (mg/dL)   Urobilinogen, UA 1.0  0.0 - 1.0 (mg/dL)   Nitrite NEGATIVE  NEGATIVE    Leukocytes, UA NEGATIVE  NEGATIVE    Urinalysis normal  Ct Abdomen Pelvis Wo Contrast  10/02/2011  *RADIOLOGY REPORT*  Clinical Data: Left lower quadrant abdominal pain, nausea, lung cancer  CT ABDOMEN AND PELVIS WITHOUT CONTRAST  Technique:  Multidetector CT imaging of the abdomen and pelvis was performed following the standard protocol without intravenous contrast.  Comparison: 03/30/2010  Findings: Minimal patchy opacity in the lingula.  Unenhanced liver, spleen, pancreas, and adrenal glands within normal limits.  Status post cholecystectomy.  No intrahepatic or extrahepatic ductal dilatation.  Kidneys within normal limits.  No renal calculi or hydronephrosis.  No evidence of bowel obstruction.  Normal appendix.  Colonic diverticulosis with mild stranding involving the distal descending colon (series 2/image 68), compatible with mild diverticulitis.  No drainable fluid collection or abscess.  No free air.  Atherosclerotic calcifications of the abdominal aorta and branch vessels.  No abdominopelvic ascites.  No suspicious abdominopelvic lymphadenopathy.  Status post hysterectomy.  No adnexal masses.  No ureteral or bladder calculi.  Mild degenerative changes of the visualized thoracolumbar spine.  IMPRESSION: Mild diverticulitis involving the distal descending colon.  No drainable fluid collection or abscess.  No free air.  Original Report Authenticated By: Charline Bills, M.D.     1. Diverticulitis     New Prescriptions   CIPROFLOXACIN (CIPRO) 500 MG TABLET    Take 1 tablet (500 mg total) by mouth 2 (two) times daily.   HYDROCODONE-ACETAMINOPHEN (NORCO) 5-325 MG PER TABLET    Take 2 tablets by mouth every 6 (six) hours as needed for pain.   METRONIDAZOLE (FLAGYL) 500 MG TABLET    Take 1 tablet (500 mg total) by mouth 3 (three) times daily.    PROMETHAZINE (PHENERGAN) 25 MG TABLET    Take 1 tablet (25 mg total) by mouth every 6 (six) hours as needed for nausea.   Plan discharge  Devoria Albe, MD, FACEP   MDM          Ward Givens, MD 10/02/11 [phone]

## 2011-10-04 ENCOUNTER — Encounter: Payer: Self-pay | Admitting: Internal Medicine

## 2011-10-04 ENCOUNTER — Ambulatory Visit (INDEPENDENT_AMBULATORY_CARE_PROVIDER_SITE_OTHER): Payer: Medicare Other | Admitting: Internal Medicine

## 2011-10-04 DIAGNOSIS — K5792 Diverticulitis of intestine, part unspecified, without perforation or abscess without bleeding: Secondary | ICD-10-CM

## 2011-10-04 DIAGNOSIS — K5732 Diverticulitis of large intestine without perforation or abscess without bleeding: Secondary | ICD-10-CM

## 2011-10-04 DIAGNOSIS — C3490 Malignant neoplasm of unspecified part of unspecified bronchus or lung: Secondary | ICD-10-CM

## 2011-10-04 NOTE — Progress Notes (Signed)
  Subjective:    Patient ID: [name], female    DOB: June 15, 1946, 65 y.o.   MRN: [id number]  HPI  Pt seen for diverticulitis--doing much better. Reviewed CT abd/pelvis from 09/2011. She did have one episode of emesis this a.m. Still on cipro and flagyl.  Husband has noticed worsening cognition. Can't remember events from one day to the next. Has hx of non small cell CA of lung--has been treated with chemotherapy. Last MRI brain reviewed (2011)  Past Medical History  Diagnosis Date  . Diabetes mellitus   . Hyperlipidemia   . GERD (gastroesophageal reflux disease)   . Lung cancer 2010  . Lung cancer     lung ca dx 12/2010   Past Surgical History  Procedure Date  . Appendectomy   . Abdominal hysterectomy 1997  . Carpal tunnel release     reports that she quit smoking about 2 years ago. Her smoking use included Cigarettes. She does not have any smokeless tobacco history on file. Her alcohol and drug histories not on file. family history includes Diabetes in her brother, father, and mother; Heart disease in her brother; Heart failure in her mother; and Kidney disease in her mother. Allergies  Allergen Reactions  . Ibuprofen     REACTION: Anxiousness, hypoventilate      Review of Systems  patient denies chest pain, shortness of breath, orthopnea. Denies lower extremity edema, abdominal pain, change in appetite, change in bowel movements. Patient denies rashes, musculoskeletal complaints. No other specific complaints in a complete review of systems.      Objective:   Physical Exam  Well-developed well-nourished female in no acute distress. HEENT exam atraumatic, normocephalic, extraocular muscles are intact. Neck is supple. No jugular venous distention no thyromegaly. Chest clear to auscultation without increased work of breathing. Cardiac exam S1 and S2 are regular. Abdominal exam active bowel sounds, soft, mildly tender left lower quadrant. Extremities no edema. Neurologic exam  she is alert     Assessment & Plan:  Diverticulitis-continue ABX Advance diet Call for any concerns.

## 2011-10-04 NOTE — Assessment & Plan Note (Signed)
I doubt that this is related to her memory issue. Will not repeat brain imaging at this time

## 2011-10-13 ENCOUNTER — Other Ambulatory Visit: Payer: Self-pay | Admitting: Internal Medicine

## 2011-12-08 IMAGING — CR DG CHEST 2V
2 series · 2 of 2 positions shown · non-contrast
Comparison: 10/20/2010

CLINICAL DATA: Chest pain

CHEST - 2 VIEW

[w chest pa]
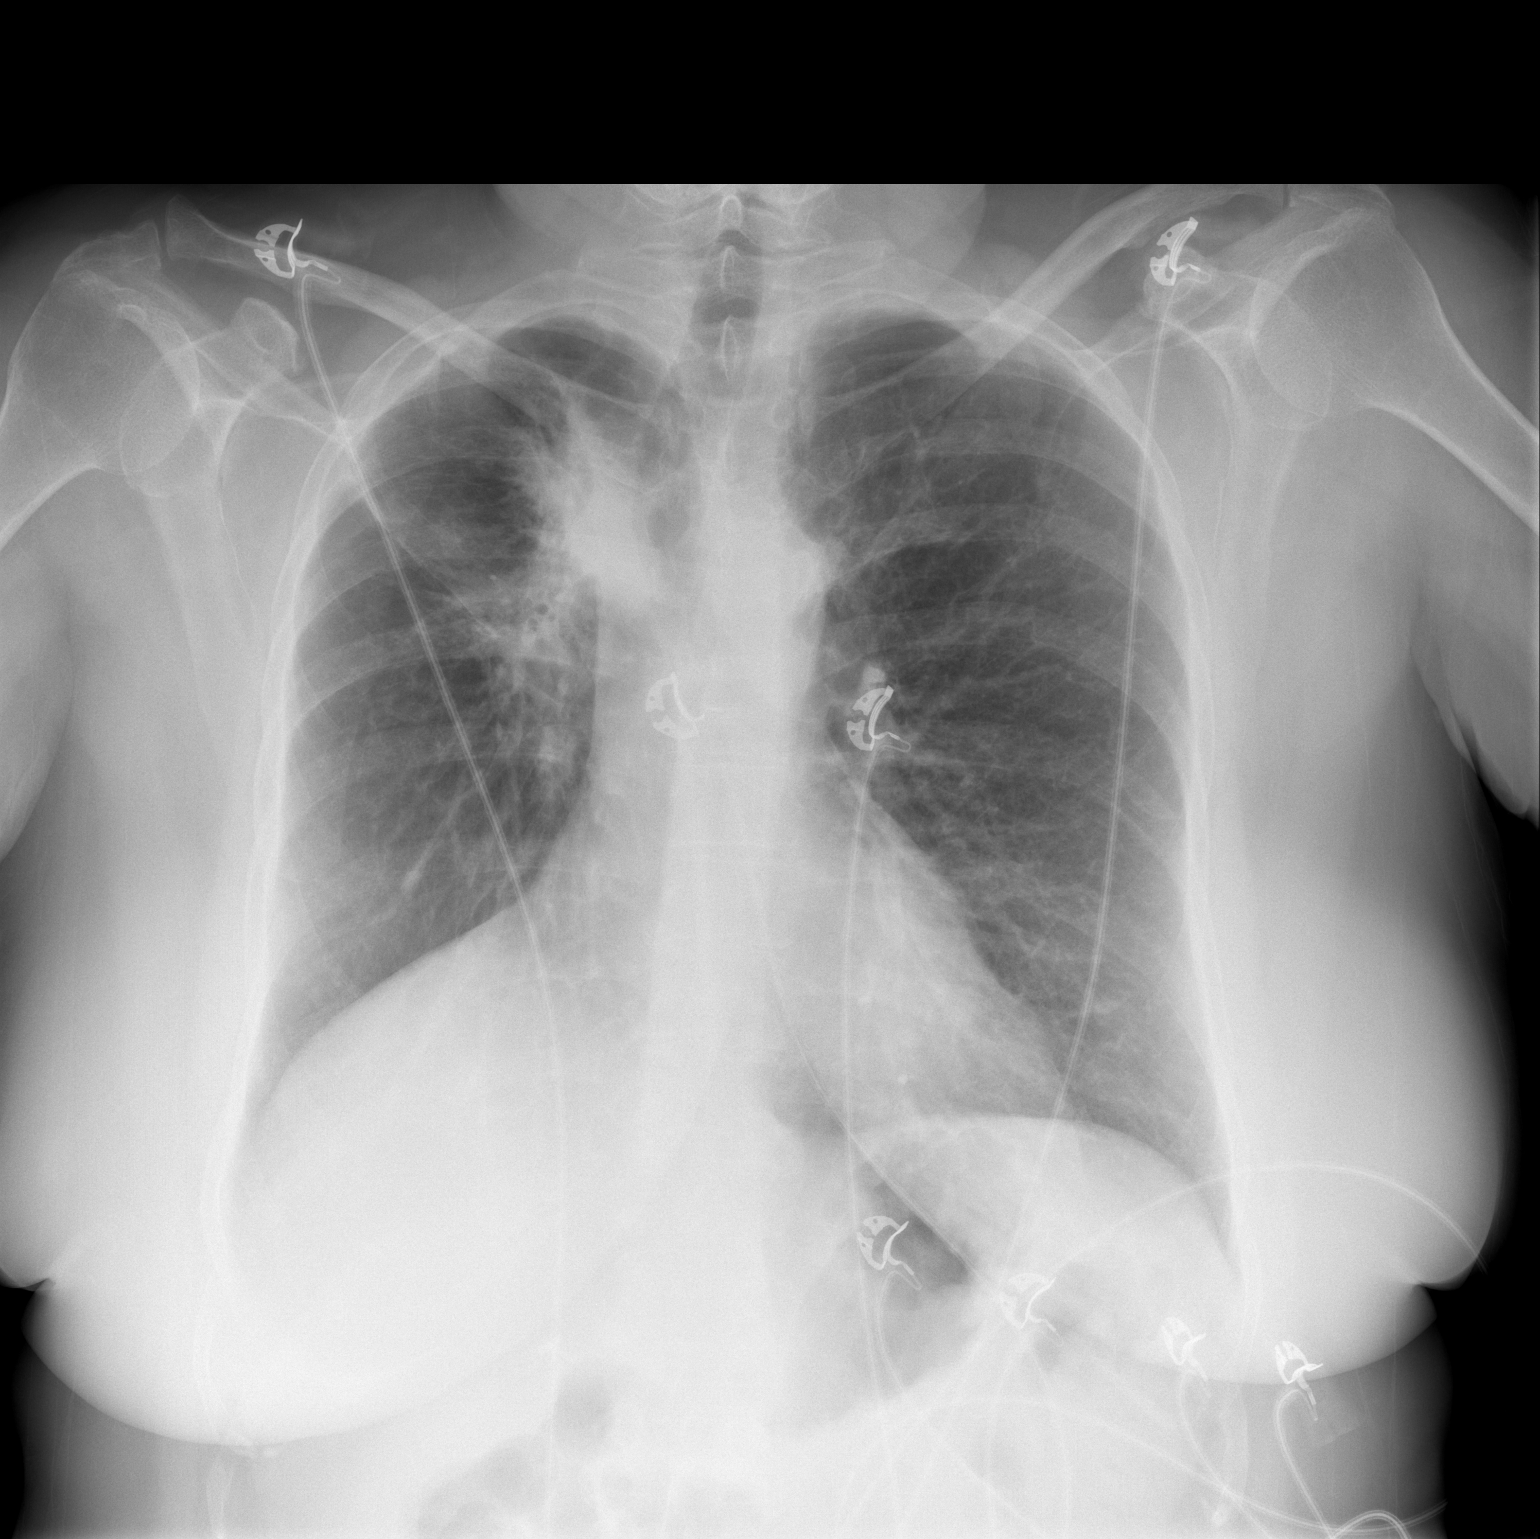

[w chest lat]
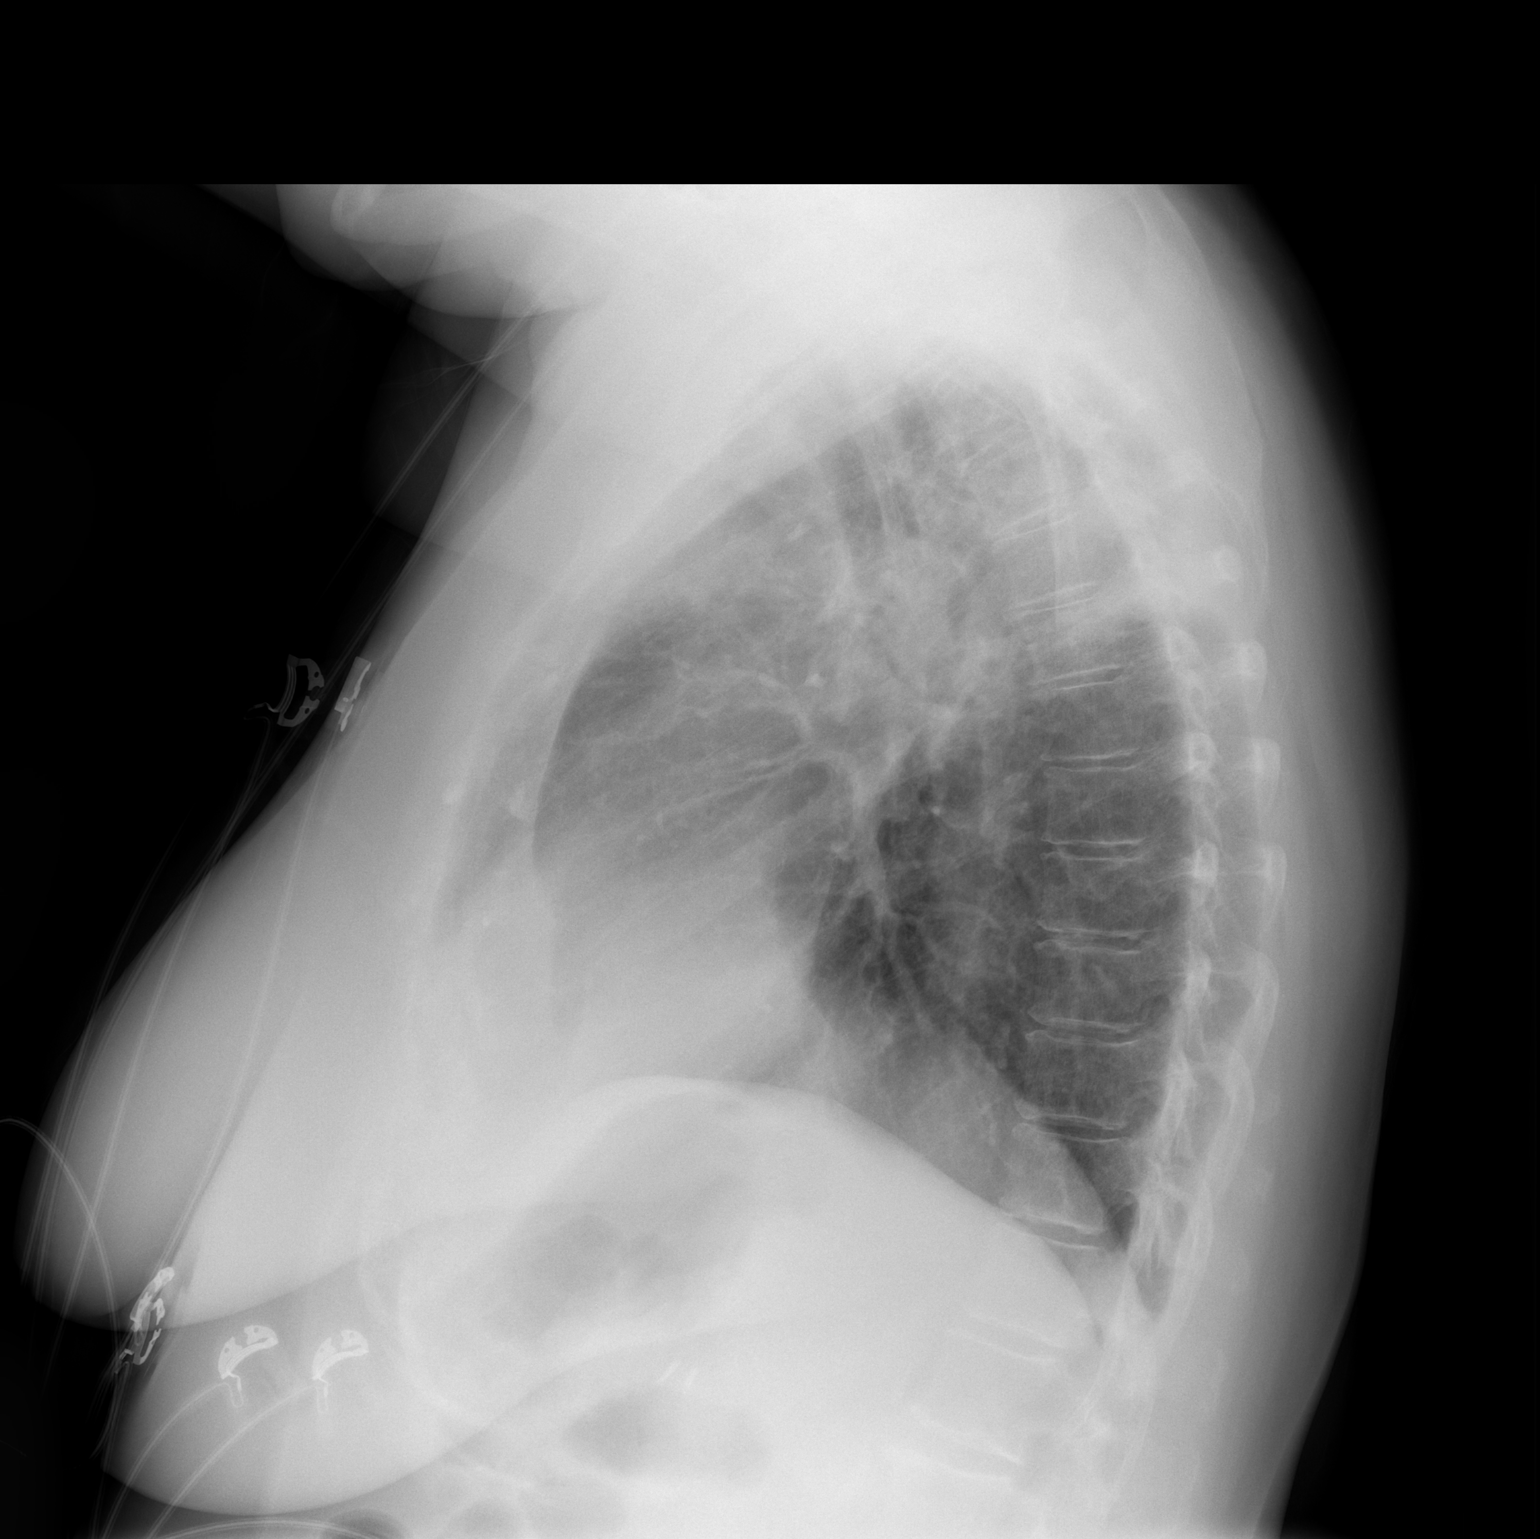

[2 of 2 positions shown; findings below may reference images not displayed]

FINDINGS: The heart  is normal.

There is asymmetric elevation of the right hemidiaphragm.

Right upper lobe scarring and paramediastinal radiation change is
again noted and appears improved from 07/01/2010.

No new findings identified.
IMPRESSION: 1.  No acute cardiopulmonary abnormalities.
2.  Interval improvement in paramediastinal radiation change within
the right upper lobe.

## 2011-12-11 IMAGING — CR DG CHEST 2V
2 series · 2 of 2 positions shown · non-contrast
Comparison: CT 02/04/2011, chest x-ray 02/04/2011

CLINICAL DATA: Short of breath, bronchitis

CHEST - 2 VIEW

[w chest pa]
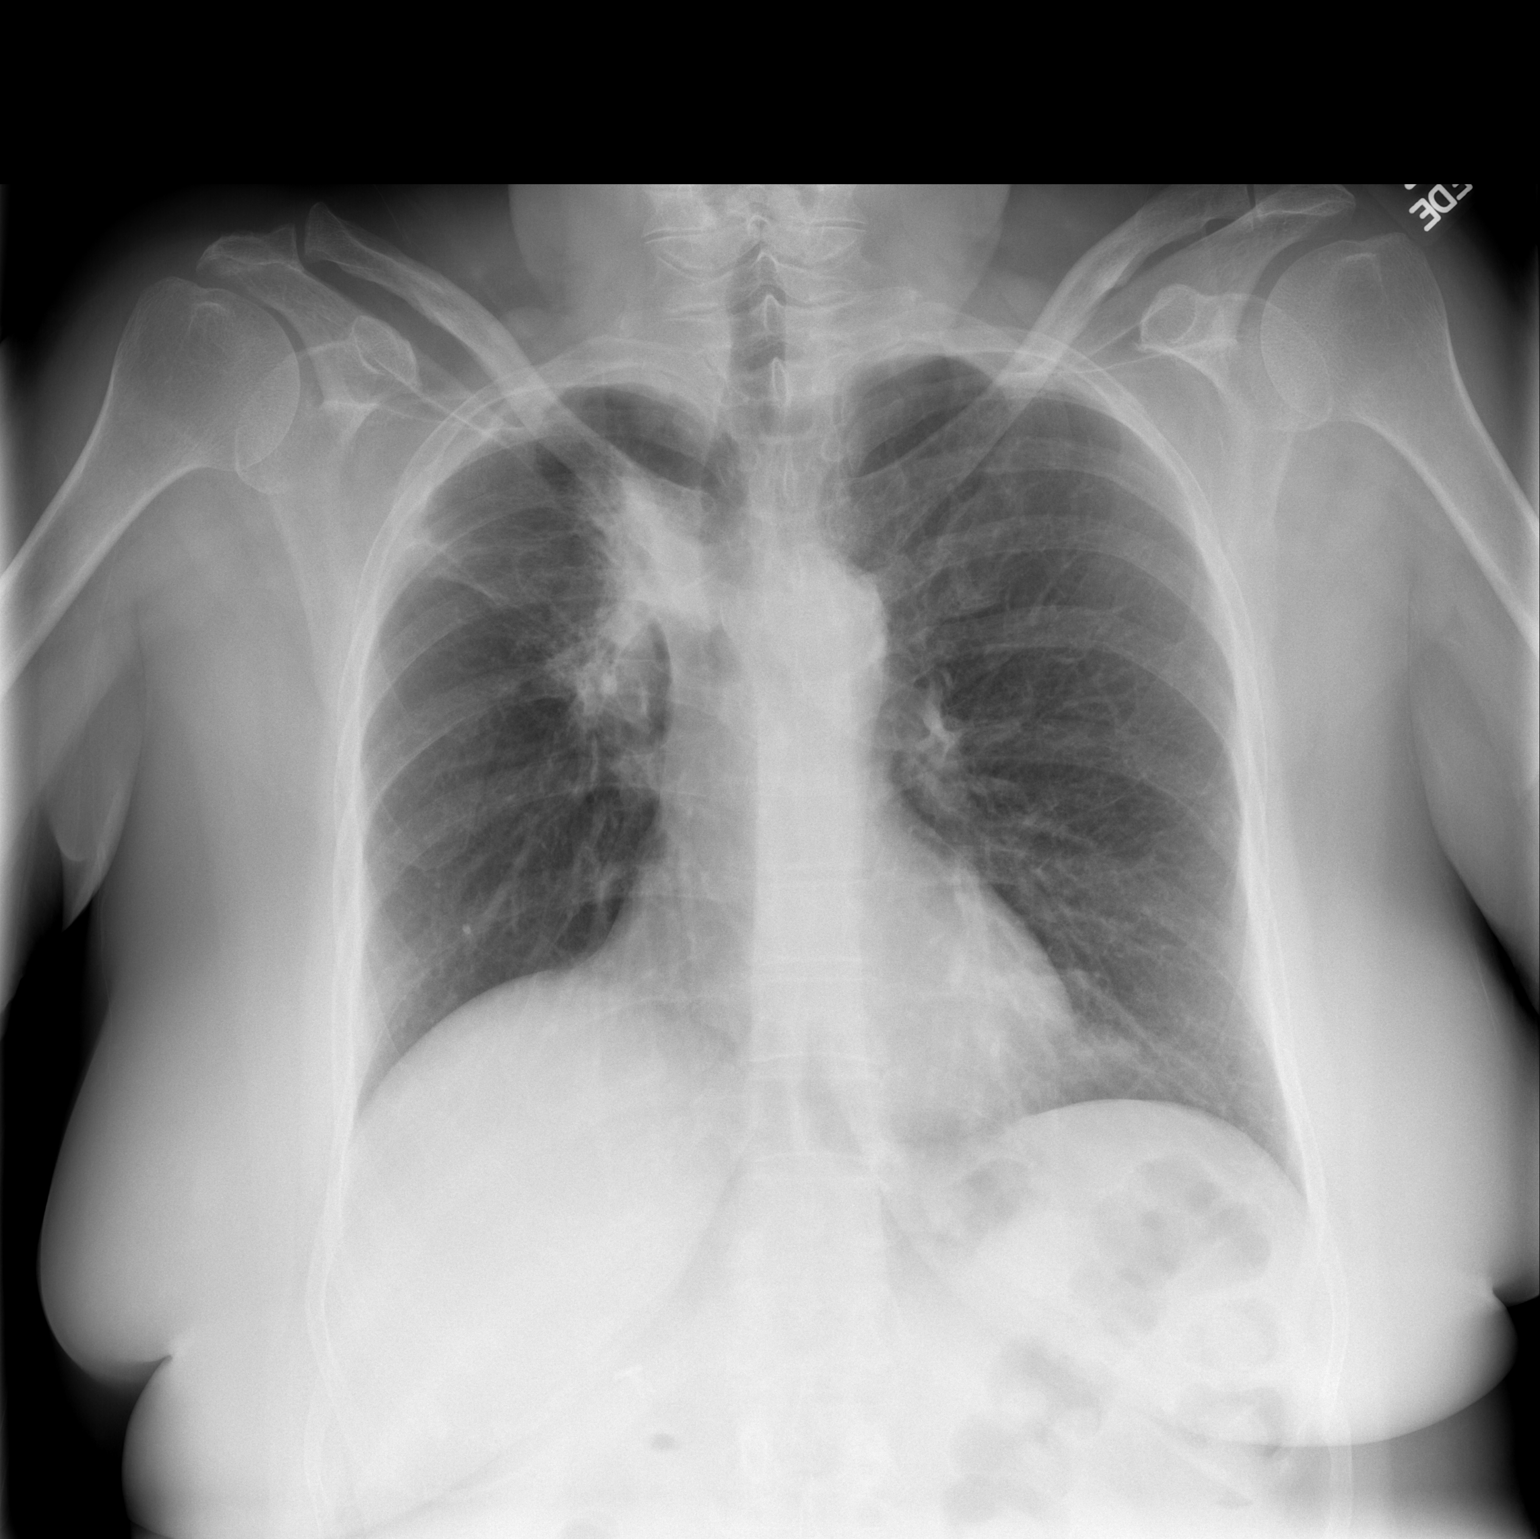

[w chest lat]
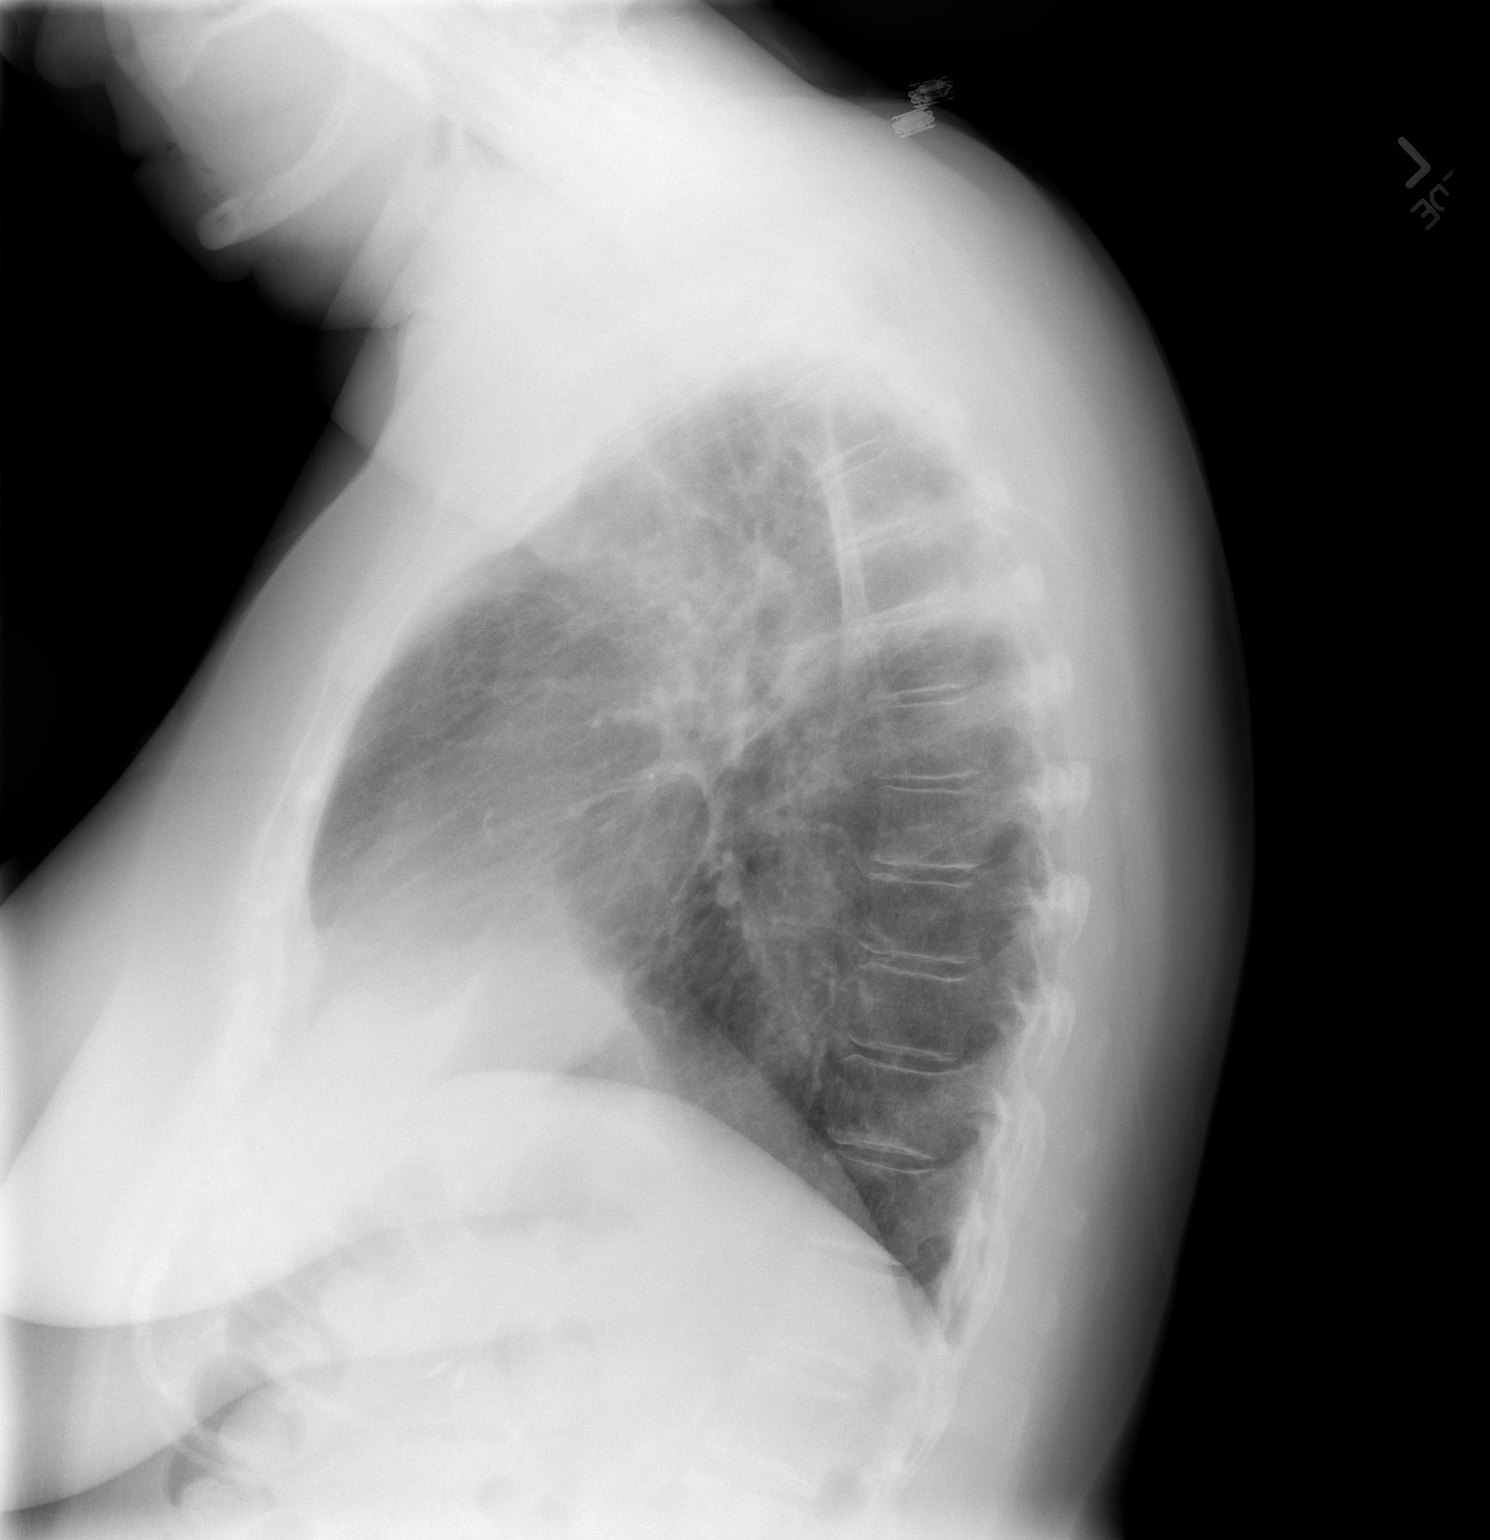

[2 of 2 positions shown; findings below may reference images not displayed]

FINDINGS: Right upper lobe density is unchanged.  This may be due
to radiation pneumonitis or radiation fibrosis.  Underlying
residual tumor cannot be excluded.

No acute infiltrate or effusion.  Negative for pneumonia or heart
failure.
IMPRESSION: Right upper lobe density is unchanged and appears chronic.  No
superimposed acute process.

## 2011-12-29 ENCOUNTER — Encounter: Payer: Self-pay | Admitting: Internal Medicine

## 2011-12-29 ENCOUNTER — Ambulatory Visit (INDEPENDENT_AMBULATORY_CARE_PROVIDER_SITE_OTHER): Payer: Medicare Other | Admitting: Internal Medicine

## 2011-12-29 DIAGNOSIS — C3490 Malignant neoplasm of unspecified part of unspecified bronchus or lung: Secondary | ICD-10-CM

## 2011-12-29 DIAGNOSIS — E119 Type 2 diabetes mellitus without complications: Secondary | ICD-10-CM

## 2011-12-29 DIAGNOSIS — E785 Hyperlipidemia, unspecified: Secondary | ICD-10-CM

## 2011-12-29 LAB — HEPATIC FUNCTION PANEL
ALT: 19 U/L (ref 0–35)
AST: 24 U/L (ref 0–37)
Bilirubin, Direct: 0 mg/dL (ref 0.0–0.3)
Total Bilirubin: 0.4 mg/dL (ref 0.3–1.2)

## 2011-12-29 LAB — BASIC METABOLIC PANEL
CO2: 30 mEq/L (ref 19–32)
Chloride: 107 mEq/L (ref 96–112)
Creatinine, Ser: 0.8 mg/dL (ref 0.4–1.2)
Potassium: 5.3 mEq/L — ABNORMAL HIGH (ref 3.5–5.1)
Sodium: 142 mEq/L (ref 135–145)

## 2011-12-29 LAB — LIPID PANEL
Cholesterol: 176 mg/dL (ref 0–200)
LDL Cholesterol: 99 mg/dL (ref 0–99)
Total CHOL/HDL Ratio: 5
VLDL: 39.2 mg/dL (ref 0.0–40.0)

## 2011-12-29 LAB — HEMOGLOBIN A1C: Hgb A1c MFr Bld: 7.2 % — ABNORMAL HIGH (ref 4.6–6.5)

## 2011-12-29 NOTE — Patient Instructions (Signed)
Call your insurance company and see if they will cover shingles vaccine. If they will, call us and we will give it to you  

## 2011-12-29 NOTE — Assessment & Plan Note (Signed)
Previously controlled Continue meds 

## 2011-12-29 NOTE — Progress Notes (Signed)
  Subjective:    Patient ID: [name], female    DOB: 1946-09-10, 66 y.o.   MRN: [id number]  HPI  patient comes in for followup of multiple medical problems including type 2 diabetes, hyperlipidemia, hypertension. The patient does not check blood sugar or blood pressure at home. The patetient does not follow an exercise or diet program. The patient denies any polyuria, polydipsia.  In the past the patient has gone to diabetic treatment center. The patient is tolerating medications  Without difficulty. The patient does admit to medication compliance.   Past Medical History  Diagnosis Date  . Diabetes mellitus   . Hyperlipidemia   . GERD (gastroesophageal reflux disease)   . Lung cancer 2010  . Lung cancer     lung ca dx 12/2010   Past Surgical History  Procedure Date  . Appendectomy   . Abdominal hysterectomy 1997  . Carpal tunnel release     reports that she quit smoking about 3 years ago. Her smoking use included Cigarettes. She does not have any smokeless tobacco history on file. Her alcohol and drug histories not on file. family history includes Diabetes in her brother, father, and mother; Heart disease in her brother; Heart failure in her mother; and Kidney disease in her mother. Allergies  Allergen Reactions  . Ibuprofen     REACTION: Anxiousness, hypoventilate       Review of Systems  patient denies chest pain, shortness of breath, orthopnea. Denies lower extremity edema, abdominal pain, change in appetite, change in bowel movements. Patient denies rashes, musculoskeletal complaints. No other specific complaints in a complete review of systems.      Objective:   Physical Exam  Well-developed well-nourished female in no acute distress. HEENT exam atraumatic, normocephalic, extraocular muscles are intact. Neck is supple. No jugular venous distention no thyromegaly. Chest clear to auscultation without increased work of breathing. Cardiac exam S1 and S2 are regular.  Abdominal exam active bowel sounds, soft, nontender. Extremities no edema. Neurologic exam she is alert without any motor sensory deficits. Gait is normal.        Assessment & Plan:

## 2011-12-29 NOTE — Assessment & Plan Note (Signed)
Has regular f/u with Dr. [name] Friendly

## 2011-12-29 NOTE — Assessment & Plan Note (Signed)
Will check labs today She understands the need for diet and exercise

## 2011-12-30 ENCOUNTER — Ambulatory Visit: Payer: PRIVATE HEALTH INSURANCE | Admitting: Internal Medicine

## 2012-01-21 ENCOUNTER — Inpatient Hospital Stay (HOSPITAL_BASED_OUTPATIENT_CLINIC_OR_DEPARTMENT_OTHER)
Admission: EM | Admit: 2012-01-21 | Discharge: 2012-01-22 | DRG: 191 | Disposition: A | Discharge status: Home / Self Care | Payer: Medicare Other | Attending: Internal Medicine | Admitting: Internal Medicine

## 2012-01-21 ENCOUNTER — Emergency Department (INDEPENDENT_AMBULATORY_CARE_PROVIDER_SITE_OTHER): Payer: Medicare Other

## 2012-01-21 ENCOUNTER — Encounter (HOSPITAL_BASED_OUTPATIENT_CLINIC_OR_DEPARTMENT_OTHER): Payer: Self-pay | Admitting: Emergency Medicine

## 2012-01-21 DIAGNOSIS — E1165 Type 2 diabetes mellitus with hyperglycemia: Secondary | ICD-10-CM | POA: Diagnosis present

## 2012-01-21 DIAGNOSIS — C3490 Malignant neoplasm of unspecified part of unspecified bronchus or lung: Secondary | ICD-10-CM | POA: Diagnosis present

## 2012-01-21 DIAGNOSIS — IMO0002 Reserved for concepts with insufficient information to code with codable children: Secondary | ICD-10-CM | POA: Diagnosis present

## 2012-01-21 DIAGNOSIS — E785 Hyperlipidemia, unspecified: Secondary | ICD-10-CM | POA: Diagnosis present

## 2012-01-21 DIAGNOSIS — E119 Type 2 diabetes mellitus without complications: Secondary | ICD-10-CM | POA: Diagnosis present

## 2012-01-21 DIAGNOSIS — Z85118 Personal history of other malignant neoplasm of bronchus and lung: Secondary | ICD-10-CM

## 2012-01-21 DIAGNOSIS — J449 Chronic obstructive pulmonary disease, unspecified: Secondary | ICD-10-CM | POA: Diagnosis present

## 2012-01-21 DIAGNOSIS — J441 Chronic obstructive pulmonary disease with (acute) exacerbation: Principal | ICD-10-CM | POA: Diagnosis present

## 2012-01-21 DIAGNOSIS — R05 Cough: Secondary | ICD-10-CM

## 2012-01-21 DIAGNOSIS — R918 Other nonspecific abnormal finding of lung field: Secondary | ICD-10-CM

## 2012-01-21 DIAGNOSIS — Z7982 Long term (current) use of aspirin: Secondary | ICD-10-CM

## 2012-01-21 DIAGNOSIS — R059 Cough, unspecified: Secondary | ICD-10-CM

## 2012-01-21 DIAGNOSIS — F17200 Nicotine dependence, unspecified, uncomplicated: Secondary | ICD-10-CM | POA: Diagnosis present

## 2012-01-21 DIAGNOSIS — E782 Mixed hyperlipidemia: Secondary | ICD-10-CM | POA: Diagnosis present

## 2012-01-21 DIAGNOSIS — I2510 Atherosclerotic heart disease of native coronary artery without angina pectoris: Secondary | ICD-10-CM | POA: Diagnosis present

## 2012-01-21 HISTORY — DX: Chronic obstructive pulmonary disease, unspecified: J44.9

## 2012-01-21 HISTORY — DX: Personal history of other diseases of the digestive system: Z87.19

## 2012-01-21 HISTORY — DX: Atherosclerotic heart disease of native coronary artery without angina pectoris: I25.10

## 2012-01-21 LAB — CBC
Hemoglobin: 12.9 g/dL (ref 12.0–15.0)
MCH: 31.2 pg (ref 26.0–34.0)
MCHC: 33.4 g/dL (ref 30.0–36.0)
Platelets: 140 10*3/uL — ABNORMAL LOW (ref 150–400)
RBC: 4.14 MIL/uL (ref 3.87–5.11)

## 2012-01-21 LAB — BASIC METABOLIC PANEL
CO2: 26 mEq/L (ref 19–32)
Calcium: 9 mg/dL (ref 8.4–10.5)
GFR calc non Af Amer: >90 mL/min (ref >90)
Potassium: 3.4 mEq/L — ABNORMAL LOW (ref 3.5–5.1)
Sodium: 140 mEq/L (ref 135–145)

## 2012-01-21 MED ORDER — SODIUM CHLORIDE 0.9 % IJ SOLN: 3.0000 mL | Freq: Two times a day (BID) | INTRAMUSCULAR | Status: DC

## 2012-01-21 MED ORDER — INSULIN ASPART PROT & ASPART (70-30 MIX) 100 UNIT/ML ~~LOC~~ SUSP
22.0000 [IU] | Freq: Every day | SUBCUTANEOUS | Status: DC
  Filled 2012-01-21: qty 3

## 2012-01-21 MED ORDER — GUAIFENESIN ER 600 MG PO TB12
600.0000 mg | ORAL_TABLET | Freq: Two times a day (BID) | ORAL | Status: DC
  Administered 2012-01-21 – 2012-01-22 (×2): 600 mg via ORAL
  Filled 2012-01-21 (×3): qty 1

## 2012-01-21 MED ORDER — IPRATROPIUM BROMIDE 0.02 % IN SOLN
0.5000 mg | Freq: Four times a day (QID) | RESPIRATORY_TRACT | Status: DC
  Administered 2012-01-21 – 2012-01-22 (×2): 0.5 mg via RESPIRATORY_TRACT
  Filled 2012-01-21 (×3): qty 2.5

## 2012-01-21 MED ORDER — ONDANSETRON HCL 4 MG PO TABS: 4.0000 mg | ORAL_TABLET | Freq: Four times a day (QID) | ORAL | Status: DC | PRN

## 2012-01-21 MED ORDER — PREDNISONE 10 MG PO TABS
10.0000 mg | ORAL_TABLET | Freq: Every day | ORAL | Status: DC
  Administered 2012-01-22: 10 mg via ORAL
  Filled 2012-01-21 (×2): qty 1

## 2012-01-21 MED ORDER — ALBUTEROL (5 MG/ML) CONTINUOUS INHALATION SOLN
10.0000 mg/h | INHALATION_SOLUTION | Freq: Once | RESPIRATORY_TRACT | Status: AC
  Administered 2012-01-21: 10 mg/h via RESPIRATORY_TRACT
  Filled 2012-01-21: qty 20
  Filled 2012-01-21: qty 0.5

## 2012-01-21 MED ORDER — SODIUM CHLORIDE 0.9 % IJ SOLN: 3.0000 mL | INTRAMUSCULAR | Status: DC | PRN

## 2012-01-21 MED ORDER — PREDNISONE 50 MG PO TABS
60.0000 mg | ORAL_TABLET | Freq: Once | ORAL | Status: AC
  Administered 2012-01-21: 60 mg via ORAL
  Filled 2012-01-21: qty 1

## 2012-01-21 MED ORDER — DOCUSATE SODIUM 100 MG PO CAPS
100.0000 mg | ORAL_CAPSULE | Freq: Two times a day (BID) | ORAL | Status: DC
  Administered 2012-01-21 – 2012-01-22 (×2): 100 mg via ORAL
  Filled 2012-01-21 (×3): qty 1

## 2012-01-21 MED ORDER — INSULIN NPH ISOPHANE & REGULAR (70-30) 100 UNIT/ML ~~LOC~~ SUSP: 20.0000 [IU] | Freq: Two times a day (BID) | SUBCUTANEOUS | Status: DC

## 2012-01-21 MED ORDER — MOXIFLOXACIN HCL IN NACL 400 MG/250ML IV SOLN
400.0000 mg | Freq: Once | INTRAVENOUS | Status: AC
  Administered 2012-01-21: 400 mg via INTRAVENOUS
  Filled 2012-01-21: qty 250

## 2012-01-21 MED ORDER — POTASSIUM CHLORIDE CRYS ER 20 MEQ PO TBCR
20.0000 meq | EXTENDED_RELEASE_TABLET | Freq: Once | ORAL | Status: AC
  Administered 2012-01-21: 20 meq via ORAL
  Filled 2012-01-21: qty 1

## 2012-01-21 MED ORDER — HEPARIN SODIUM (PORCINE) 5000 UNIT/ML IJ SOLN
5000.0000 [IU] | Freq: Three times a day (TID) | INTRAMUSCULAR | Status: DC
  Administered 2012-01-21: 5000 [IU] via SUBCUTANEOUS
  Filled 2012-01-21 (×5): qty 1

## 2012-01-21 MED ORDER — SODIUM CHLORIDE 0.9 % IV SOLN
INTRAVENOUS | Status: AC
  Administered 2012-01-21: 17:00:00 via INTRAVENOUS

## 2012-01-21 MED ORDER — ACETAMINOPHEN 650 MG RE SUPP: 650.0000 mg | Freq: Four times a day (QID) | RECTAL | Status: DC | PRN

## 2012-01-21 MED ORDER — ALBUTEROL SULFATE (5 MG/ML) 0.5% IN NEBU: 2.5000 mg | INHALATION_SOLUTION | RESPIRATORY_TRACT | Status: DC

## 2012-01-21 MED ORDER — SODIUM CHLORIDE 0.9 % IV SOLN: 250.0000 mL | INTRAVENOUS | Status: DC | PRN

## 2012-01-21 MED ORDER — ACETAMINOPHEN 325 MG PO TABS: 650.0000 mg | ORAL_TABLET | Freq: Four times a day (QID) | ORAL | Status: DC | PRN

## 2012-01-21 MED ORDER — POLYETHYLENE GLYCOL 3350 17 G PO PACK
17.0000 g | PACK | Freq: Every day | ORAL | Status: DC | PRN
  Filled 2012-01-21: qty 1

## 2012-01-21 MED ORDER — SENNA 8.6 MG PO TABS
1.0000 | ORAL_TABLET | Freq: Two times a day (BID) | ORAL | Status: DC
  Administered 2012-01-21 – 2012-01-22 (×2): 8.6 mg via ORAL
  Filled 2012-01-21 (×3): qty 1

## 2012-01-21 MED ORDER — IPRATROPIUM BROMIDE 0.02 % IN SOLN
0.5000 mg | Freq: Once | RESPIRATORY_TRACT | Status: AC
  Administered 2012-01-21: 0.5 mg via RESPIRATORY_TRACT
  Filled 2012-01-21: qty 2.5

## 2012-01-21 MED ORDER — INSULIN ASPART PROT & ASPART (70-30 MIX) 100 UNIT/ML ~~LOC~~ SUSP
20.0000 [IU] | Freq: Every day | SUBCUTANEOUS | Status: DC
  Administered 2012-01-22: 20 [IU] via SUBCUTANEOUS
  Filled 2012-01-21: qty 3

## 2012-01-21 MED ORDER — ASPIRIN EC 81 MG PO TBEC
81.0000 mg | DELAYED_RELEASE_TABLET | Freq: Every day | ORAL | Status: DC
  Administered 2012-01-22: 81 mg via ORAL
  Filled 2012-01-21: qty 1

## 2012-01-21 MED ORDER — ONDANSETRON HCL 4 MG/2ML IJ SOLN: 4.0000 mg | Freq: Four times a day (QID) | INTRAMUSCULAR | Status: DC | PRN

## 2012-01-21 MED ORDER — SIMVASTATIN 10 MG PO TABS
10.0000 mg | ORAL_TABLET | Freq: Every day | ORAL | Status: DC
  Administered 2012-01-21: 10 mg via ORAL
  Filled 2012-01-21 (×2): qty 1

## 2012-01-21 MED ORDER — ASPIRIN 81 MG PO TABS
81.0000 mg | ORAL_TABLET | Freq: Every day | ORAL | Status: DC
Start: 2012-01-21 — End: 2012-01-21

## 2012-01-21 MED ORDER — ALBUTEROL SULFATE (5 MG/ML) 0.5% IN NEBU: 2.5000 mg | INHALATION_SOLUTION | Freq: Four times a day (QID) | RESPIRATORY_TRACT | Status: DC | PRN

## 2012-01-21 MED ORDER — ALBUTEROL SULFATE (5 MG/ML) 0.5% IN NEBU
5.0000 mg | INHALATION_SOLUTION | Freq: Once | RESPIRATORY_TRACT | Status: AC
  Administered 2012-01-21: 5 mg via RESPIRATORY_TRACT
  Filled 2012-01-21: qty 1

## 2012-01-21 NOTE — Progress Notes (Signed)
Pt tolerated tx well.  Pt is moving more air bilaterally.  Breath sounds are coarse sl exp. Wheeze.  Vital signs stable.  Will continue to monitor.

## 2012-01-21 NOTE — Progress Notes (Addendum)
Called by primary RN to see pt for increased HR & SOB.  On arrival pt resting in bed A & O.  BBS CTA diminished in bases.  Pt repositioned & educated on proper use of IS.  Pt able to pull 1250 x3 on IS.  HR 103 ST, Sats 97% on 2.5L, BP 110/62, RR 24.  Pt states she feels like her WOB is better.  Pt awaiting visit from primary MD.  Will cont. To monitor as needed by the nursing staff.

## 2012-01-21 NOTE — ED Provider Notes (Addendum)
History     CSN: [id number]  Arrival date & time 01/21/12  1133   First MD Initiated Contact with Patient 01/21/12 1152      Chief Complaint  Patient presents with  . Wheezing  . Generalized Body Aches    (Consider location/radiation/quality/duration/timing/severity/associated sxs/prior treatment) HPI Comments: Patient presents with gradually worsening cough and shortness of breath over the last several days.  She does have a family member who has been recently diagnosed with bronchitis.  The patient herself had a previous diagnosis of lung cancer currently in remission.  She also has COPD.  Patient has not been increasing her use of her inhalers.  [facility] and was told she had significant wheezing and that she needed to go to emergency department.  Patient has not noted any fevers or chest pain.  Patient is a 66 y.o. female presenting with wheezing. The history is provided by the patient. No language interpreter was used.  Wheezing  The current episode started today. The onset was gradual. The problem occurs frequently. The problem has been gradually worsening. The problem is moderate. The symptoms are relieved by nothing. The symptoms are aggravated by nothing. Associated symptoms include cough, shortness of breath and wheezing. Pertinent negatives include no chest pain, no chest pressure, no fever, no rhinorrhea and no stridor. She has had intermittent steroid use. Her past medical history is significant for past wheezing.    Past Medical History  Diagnosis Date  . Diabetes mellitus   . Hyperlipidemia   . GERD (gastroesophageal reflux disease)   . Lung cancer 2010  . Lung cancer     lung ca dx 12/2010    Past Surgical History  Procedure Date  . Appendectomy   . Abdominal hysterectomy 1997  . Carpal tunnel release     Family History  Problem Relation Age of Onset  . Heart failure Mother   . Kidney disease Mother     renal failure  . Diabetes  Mother   . Diabetes Father   . Diabetes Brother   . Heart disease Brother     History  Substance Use Topics  . Smoking status: Former Smoker    Types: Cigarettes    Quit date: 10/24/2008  . Smokeless tobacco: Not on file  . Alcohol Use: Not on file    OB History    Grav Para Term Preterm Abortions TAB SAB Ect Mult Living                  Review of Systems  Constitutional: Negative.  Negative for fever and chills.  HENT: Negative.  Negative for rhinorrhea.   Eyes: Negative.  Negative for discharge and redness.  Respiratory: Positive for cough, shortness of breath and wheezing. Negative for stridor.   Cardiovascular: Negative.  Negative for chest pain.  Gastrointestinal: Negative.  Negative for nausea, vomiting, abdominal pain and diarrhea.  Genitourinary: Negative.  Negative for dysuria and vaginal discharge.  Musculoskeletal: Negative.  Negative for back pain.  Skin: Negative.  Negative for color change and rash.  Neurological: Negative.  Negative for syncope and headaches.  Hematological: Negative.  Negative for adenopathy.  Psychiatric/Behavioral: Negative.  Negative for confusion.  All other systems reviewed and are negative.    Allergies  Ibuprofen  Home Medications   Current Outpatient Rx  Name Route Sig Dispense Refill  . ASPIRIN 81 MG PO TABS Oral Take 81 mg by mouth daily.    . MULTIVITAMINS PO CAPS Oral  Take 1 capsule by mouth daily.    Marland Kitchen HUMULIN 70/30 [place] Subcutaneous Inject 20 Units into the skin 2 (two) times daily. Takes 20 units with breakfast, 22 units with dinner    . LORATADINE 10 MG PO TABS Oral Take 10 mg by mouth daily.      Marland Kitchen NITROGLYCERIN 0.4 MG SL SUBL Sublingual Place 0.4 mg under the tongue every 5 (five) minutes as needed.      Marland Kitchen PROAIR HFA 108 (90 BASE) MCG/ACT IN AERS Inhalation Inhale 2 puffs into the lungs daily.    Marland Kitchen SIMVASTATIN 10 MG PO TABS  TAKE ONE TABLET BY MOUTH AT BEDTIME 30 tablet 5    BP 122/55  Pulse 90  Temp(Src) 97.7 F  (36.5 C) (Oral)  Resp 20  SpO2 96%  Physical Exam  Nursing note and vitals reviewed. Constitutional: She is oriented to person, place, and time. She appears well-developed and well-nourished.  Non-toxic appearance. She does not have a sickly appearance.  HENT:  Head: Normocephalic and atraumatic.  Eyes: Conjunctivae, EOM and lids are normal. Pupils are equal, round, and reactive to light. No scleral icterus.  Neck: Trachea normal and normal range of motion. Neck supple.  Cardiovascular: Normal rate, regular rhythm and normal heart sounds.   Pulmonary/Chest: Effort normal. She has wheezes.       Patient has decreased breath sounds bilaterally with expiratory wheezing present  Abdominal: Soft. Normal appearance. There is no tenderness. There is no rebound, no guarding and no CVA tenderness.  Musculoskeletal: Normal range of motion.  Neurological: She is alert and oriented to person, place, and time. She has normal strength.  Skin: Skin is warm, dry and intact. No rash noted.  Psychiatric: She has a normal mood and affect. Her behavior is normal. Judgment and thought content normal.    ED Course  Procedures (including critical care time)  Dg Chest 2 View  01/21/2012  *RADIOLOGY REPORT*  Clinical Data: History of cough.  History of lung cancer.  CHEST - 2 VIEW  Comparison: Multiple priors, most recently chest x-ray 06/19/2011, and chest c t 08/17/2011.  Findings: Again noted is a focal mass-like opacity in the medial aspect of the right upper lobe with surrounding architectural distortion and chronic volume loss as evidenced by cephalad displacement of the horizontal fissure and elevation of the right hemidiaphragm.  These findings are chronic.  No definite acute airspace consolidation.  No pleural effusions.  No evidence of pulmonary edema.  Heart size is normal.  Slight shift of the mediastinal structures to the right related to chronic right-sided volume loss.  Mediastinal contours are  otherwise unremarkable. Surgical clips projecting over the right upper quadrant of the abdomen, likely from prior cholecystectomy.  IMPRESSION: 1.  Extensive post treatment related changes from radiation therapy with chronic volume loss and architectural distortion in the right upper lobe, as above.  These findings appear unchanged, and there is no radiographic evidence to suggest superimposed acute cardiopulmonary disease on today's chest x-ray.  Original Report Authenticated By: Florencia Reasons, M.D.     MDM  Patient with mild expiratory wheezing on exam and diminished breath sounds.  She received continuous nebulizer treatment and a chest x-ray to further assess for her shortness of breath.  Patient has no chest pain at this time to suggest ACS or PE currently.  She's had some progressive cough more consistent with bronchitis at this time.        [name], MD 01/21/12 [phone]  [name], MD 01/21/12 1255  Patient was reassessed after receiving her breathing treatment and now has diffuse inspiratory and expiratory wheezing.  At rest she can have sats between 90% and 100%.  We did ambulate this patient in the emergency department and after going only 15-20 feet patient noted significant subjective shortness of breath and had oxygen saturations in the 80s.  Her heart rate also increased to the 160s.  Upon resting her heart rate came down to the 1 teens and her oxygen saturations improved.  Given the patient's significant difficulties with ambulation in her shortness of breath at this time I believe she warrants transfer for admission for COPD exacerbation.  Patient's been given steroids and IV access will be established and laboratory studies.  [name], MD 01/21/12 1515  Patient discussed with Dr. Rito Ehrlich for admission to a telemetry bed on team 6.  He would like the patient to receive a dose of Avelox despite the lack of infiltrate on chest x-ray.  [name], MD 01/21/12 1630

## 2012-01-21 NOTE — ED Notes (Signed)
Pt c/o cough, general body aches x 2 days; referred here from [facility] d/t hx of lung CA & COPD

## 2012-01-21 NOTE — H&P (Signed)
PCP:  [name], MD, MD  ONCOLOGIST:  [name], MD at [facility]. PRIMARY CARDIOLOGIST:  [name], MD, [facility]  Chief Complaint:  Dyspnea, cough  HPI: 65yoF with h/o DM2/HTN/HL, non-small cell lung ca stage III s/p chemo/radiation  presents with 2 weeks of cough, ? laryngitis, dyspnea.   Pt is reliable historian, states she was in usual state of health (including not  having chronic dyspnea or cough, no home O2) until about 2 weeks ago when she  developed a cough minimally productive of whitish sputum that has progressively  worsened and caused her increasing dyspean with minimal exertion. She has also had  loss of voice over the past couple weeks, without sore throat, but this has now  resolved. She denies any fevers, chills, sweats, but did endorse "feeling cold."   At [facility] pt noted to have diffuse inspiratory and expiratory wheezing. She was 90-100%  at rest, but desatted to 80's with ambulation with 15-20 feet of walking and HR up to  160's, improved to HR 110's and O2 improved with rest.   Labs with hypoK 3.4 otherwise normal, normal CBC. CXR with previously noted focal  mass-like opacity in RUL and surrounding architectural distortion, chronic volume  loss, called as extensive post treatment related changes from radiation. Nothing  acute.   No chest pain. She has 2 episodes of vomiting last week, but nothing further, no abd  pain, diarrhea. No dysuria. ROS is otherwise negative other systems.    Past Medical History  Diagnosis Date  . Diabetes mellitus   . Hyperlipidemia   . GERD (gastroesophageal reflux disease)   . Lung cancer     non-small cell lung ca, stage III in 05/2011, s/p chemo/radiation  . COPD (chronic obstructive pulmonary disease)   . CAD (coronary artery disease)     NON obstructive. Cath 2006 preserved LV fxn, scattered irregularities without critical stenosis. 2008 stress echo negative for ischemia, but with  hypertensive response  . History of diverticulitis of colon     Past Surgical History  Procedure Date  . Appendectomy   . Abdominal hysterectomy 1997  . Carpal tunnel release     Medications:  HOME MEDS: Prior to Admission medications   Medication Sig Start Date End Date Taking? Authorizing Provider  albuterol (PROVENTIL HFA;VENTOLIN HFA) 108 (90 BASE) MCG/ACT inhaler Inhale 2 puffs into the lungs every 6 (six) hours as needed. For shortness of breath   Yes Historical Provider, MD  aspirin 81 MG tablet Take 81 mg by mouth daily.   Yes Historical Provider, MD  Insulin Isophane & Regular (HUMULIN 70/30 [place]) Inject 20 Units into the skin 2 (two) times daily. Takes 20 units with breakfast, 22 units with dinner   Yes Historical Provider, MD  loratadine (CLARITIN) 10 MG tablet Take 10 mg by mouth daily.     Yes Historical Provider, MD  Multiple Vitamin (MULTIVITAMIN) capsule Take 1 capsule by mouth daily.   Yes Historical Provider, MD  nitroGLYCERIN (NITROSTAT) 0.4 MG SL tablet Place 0.4 mg under the tongue every 5 (five) minutes as needed. For chest pain   Yes Historical Provider, MD  simvastatin (ZOCOR) 10 MG tablet Take 10 mg by mouth at bedtime.   Yes Historical Provider, MD  PROAIR HFA 108 (90 BASE) MCG/ACT inhaler Inhale 2 puffs into the lungs daily. 11/12/11   Historical Provider, MD    Allergies:  Allergies  Allergen Reactions  . Ibuprofen     REACTION: Anxiousness,  hypoventilate     Social History:   reports that she quit smoking about 3 years ago. Her smoking use included Cigarettes. She has a 45 pack-year smoking history. She does not have any smokeless tobacco history on file. Her alcohol and drug histories not on file. Lives in [place], has husband and daughter and son. Daughter's family is living with her and husband at present. She is ambulatory daily without cane or walker.   Family History: Family History  Problem Relation Age of Onset  . Heart failure Mother   .  Kidney disease Mother     renal failure  . Diabetes Mother   . Diabetes Father   . Diabetes Brother   . Heart disease Brother     Physical Exam: Filed Vitals:   01/21/12 1647 01/21/12 1843 01/21/12 1900 01/21/12 1935  BP:  105/60  110/62  Pulse:  110  106  Temp:  97.9 F (36.6 C)  98.2 F (36.8 C)  TempSrc:    Oral  Resp:    20  Height:   5\' 2"  (1.575 m)   Weight:   81.194 kg (179 lb) 81.5 kg (179 lb 10.8 oz)  SpO2: 94% 96%  97%   Blood pressure 110/62, pulse 106, temperature 98.2 F (36.8 C), temperature source Oral, resp. rate 20, height 5\' 2"  (1.575 m), weight 81.5 kg (179 lb 10.8 oz), SpO2 97.00%. Gen: Middle aged appearing F in no distress, appears minimally breathless but still  able to speak full sentences without stopping, no increased WOB or accessory muscle  use. Able to relate history well. HEENT: Pupils round, reactive, EOMI, sclera clear and normal. Mouth moist, normal  apeparing Lungs: Actually with quite good air movement and no wheezes heard throughout, overall  a fairly normal lung exam Heart: regular, slight systolic murmur at BUSB's but not gross, not tachycardic, no  gallops Abd: Soft, non tender, non distended, no facial grimacing, overall normal exam Extrem: Warm, perfusing normally, radial easily palpable, no BLE edema noted Neuro: Alert, pleasant, attentive, CN 2-12 intact, moves extremities on her own, able  to sit up in bed without assistance, grossly nonfocal.   Labs & Imaging Results for orders placed during the hospital encounter of 01/21/12 (from the past 48 hour(s))  CBC     Status: Abnormal   Collection Time   01/21/12  2:23 PM      Component Value Range Comment   WBC 7.4  4.0 - 10.5 (K/uL)    RBC 4.14  3.87 - 5.11 (MIL/uL)    Hemoglobin 12.9  12.0 - 15.0 (g/dL)    HCT 40.9  81.1 - 91.4 (%)    MCV 93.2  78.0 - 100.0 (fL)    MCH 31.2  26.0 - 34.0 (pg)    MCHC 33.4  30.0 - 36.0 (g/dL)    RDW 78.2  95.6 - 21.3 (%)    Platelets 140 (*)  150 - 400 (K/uL)   BASIC METABOLIC PANEL     Status: Abnormal   Collection Time   01/21/12  2:23 PM      Component Value Range Comment   Sodium 140  135 - 145 (mEq/L)    Potassium 3.4 (*) 3.5 - 5.1 (mEq/L)    Chloride 101  96 - 112 (mEq/L)    CO2 26  19 - 32 (mEq/L)    Glucose, Bld 169 (*) 70 - 99 (mg/dL)    BUN 9  6 - 23 (mg/dL)    Creatinine, Ser  0.70  0.50 - 1.10 (mg/dL)    Calcium 9.0  8.4 - 10.5 (mg/dL)    GFR calc non Af Amer >90  >90 (mL/min)    GFR calc Af Amer >90  >90 (mL/min)    Dg Chest 2 View  01/21/2012  *RADIOLOGY REPORT*  Clinical Data: History of cough.  History of lung cancer.  CHEST - 2 VIEW  Comparison: Multiple priors, most recently chest x-ray 06/19/2011, and chest c t 08/17/2011.  Findings: Again noted is a focal mass-like opacity in the medial aspect of the right upper lobe with surrounding architectural distortion and chronic volume loss as evidenced by cephalad displacement of the horizontal fissure and elevation of the right hemidiaphragm.  These findings are chronic.  No definite acute airspace consolidation.  No pleural effusions.  No evidence of pulmonary edema.  Heart size is normal.  Slight shift of the mediastinal structures to the right related to chronic right-sided volume loss.  Mediastinal contours are otherwise unremarkable. Surgical clips projecting over the right upper quadrant of the abdomen, likely from prior cholecystectomy.  IMPRESSION: 1.  Extensive post treatment related changes from radiation therapy with chronic volume loss and architectural distortion in the right upper lobe, as above.  These findings appear unchanged, and there is no radiographic evidence to suggest superimposed acute cardiopulmonary disease on today's chest x-ray.  Original Report Authenticated By: Florencia Reasons, M.D.    Impression Present on Admission:  .COPD (chronic obstructive pulmonary disease) .COPD exacerbation .CARCINOMA, LUNG, SMALL CELL .DIABETES MELLITUS, TYPE  II .HYPERLIPIDEMIA  65yoF with h/o DM2/HTN/HL, non-small cell lung ca stage III s/p chemo/radiation  presents with 2 weeks of cough, ? laryngitis, dyspnea.   1. Dyspnea: Likely due to viral etiology (lost voice, ? laryngitis) with pulmonary  involvement/bronchitis on background of poor lung reserve from lung ca/prior  radiation. Progression of prior radiation changes, or lung cancer recurrence  considered but probably less likely given acuity of presentation.   Frankly, she doesn't appear too bad right now and her lung exam was not really that  impressive, suggesting that albuterol alone kept her bronchodilated. I don't feel she  needs really aggressive COPD therapy at this point, probably some nebs and low dose  steroids.   - Nebs q6 and start Prednisone 10 mg daily. Do not see clear necessity for ABx so will  hold on this for now.   2. DM: Sugars fairly controlled at present, will continue home insulin regimen and  monitor, continue home ASA 81, statin  Regular bed, MC team 3 Full code, discussed with pt   Other plans as per orders.   Timotheus Salm 01/21/2012, 9:49 PM

## 2012-01-22 LAB — CBC
HCT: 37.8 % (ref 36.0–46.0)
Hemoglobin: 12.6 g/dL (ref 12.0–15.0)
MCH: 30.8 pg (ref 26.0–34.0)
MCHC: 33.3 g/dL (ref 30.0–36.0)
MCV: 92.4 fL (ref 78.0–100.0)
RDW: 14.2 % (ref 11.5–15.5)

## 2012-01-22 LAB — BASIC METABOLIC PANEL
BUN: 13 mg/dL (ref 6–23)
CO2: 24 mEq/L (ref 19–32)
Chloride: 109 mEq/L (ref 96–112)
Creatinine, Ser: 0.56 mg/dL (ref 0.50–1.10)
GFR calc Af Amer: >90 mL/min (ref >90)
Glucose, Bld: 233 mg/dL — ABNORMAL HIGH (ref 70–99)
Potassium: 4.2 mEq/L (ref 3.5–5.1)

## 2012-01-22 LAB — GLUCOSE, CAPILLARY
Glucose-Capillary: 215 mg/dL — ABNORMAL HIGH (ref 70–99)
Glucose-Capillary: 228 mg/dL — ABNORMAL HIGH (ref 70–99)

## 2012-01-22 MED ORDER — PREDNISONE 20 MG PO TABS: ORAL_TABLET | ORAL | Status: DC

## 2012-01-22 MED ORDER — PREDNISONE 10 MG PO TABS: ORAL_TABLET | ORAL | Status: DC

## 2012-01-22 MED ORDER — AZITHROMYCIN 500 MG PO TABS
500.0000 mg | ORAL_TABLET | Freq: Every day | ORAL | Status: AC
  Administered 2012-01-22: 500 mg via ORAL
  Filled 2012-01-22: qty 1

## 2012-01-22 MED ORDER — PREDNISONE 20 MG PO TABS
40.0000 mg | ORAL_TABLET | Freq: Every day | ORAL | Status: DC
  Filled 2012-01-22: qty 2

## 2012-01-22 MED ORDER — GUAIFENESIN ER 600 MG PO TB12: 600.0000 mg | ORAL_TABLET | Freq: Two times a day (BID) | ORAL | Status: DC

## 2012-01-22 MED ORDER — ALBUTEROL SULFATE HFA 108 (90 BASE) MCG/ACT IN AERS: 2.0000 | INHALATION_SPRAY | Freq: Four times a day (QID) | RESPIRATORY_TRACT | Status: DC | PRN

## 2012-01-22 MED ORDER — AZITHROMYCIN 250 MG PO TABS: ORAL_TABLET | ORAL | Status: AC

## 2012-01-22 MED ORDER — AZITHROMYCIN 250 MG PO TABS: 250.0000 mg | ORAL_TABLET | Freq: Every day | ORAL | Status: DC

## 2012-01-22 MED ORDER — TIOTROPIUM BROMIDE MONOHYDRATE 18 MCG IN CAPS: 18.0000 ug | ORAL_CAPSULE | Freq: Every day | RESPIRATORY_TRACT | Status: DC

## 2012-01-22 NOTE — Progress Notes (Signed)
[name] to be D/C'd Home per MD order.  Discussed with the patient and all questions fully answered.   [name], [name]  Home Medication Instructions [id number]   Printed on:01/22/12 1228  Medication Information                    Insulin Isophane & Regular (HUMULIN 70/30 [name]) Inject 20 Units into the skin 2 (two) times daily. Takes 20 units with breakfast, 22 units with dinner           loratadine (CLARITIN) 10 MG tablet Take 10 mg by mouth daily.             nitroGLYCERIN (NITROSTAT) 0.4 MG SL tablet Place 0.4 mg under the tongue every 5 (five) minutes as needed. For chest pain           Multiple Vitamin (MULTIVITAMIN) capsule Take 1 capsule by mouth daily.           aspirin 81 MG tablet Take 81 mg by mouth daily.           simvastatin (ZOCOR) 10 MG tablet Take 10 mg by mouth at bedtime.           albuterol (PROVENTIL HFA;VENTOLIN HFA) 108 (90 BASE) MCG/ACT inhaler Inhale 2 puffs into the lungs every 6 (six) hours as needed for wheezing or shortness of breath. For shortness of breath           azithromycin (ZITHROMAX) 250 MG tablet 250 mg, Oral, Daily, First dose on Mon 01/23/12 at 1000, For 4 doses           guaiFENesin (MUCINEX) 600 MG 12 hr tablet Take 1 tablet (600 mg total) by mouth 2 (two) times daily.           tiotropium (SPIRIVA HANDIHALER) 18 MCG inhalation capsule Place 1 capsule (18 mcg total) into inhaler and inhale daily.           predniSONE (DELTASONE) 10 MG tablet Take 4 tablets for 3 days, then Take 3 tablets for 3 days, then Take 2 tablets for 3 days, then Take 1 tablet for 1 day and then stop             VVS, Skin clean, dry and intact without evidence of skin break down, no evidence of skin tears noted. IV catheter discontinued intact. Site without signs and symptoms of complications. Dressing and pressure applied.  An After Visit Summary was printed and given to the patient. Follow up appointments , new prescriptions and medication  administration times given Patient escorted via WC, and D/C home via private auto.  Cindra Eves, RN 01/22/2012 12:28 PM

## 2012-01-22 NOTE — Progress Notes (Signed)
Physical Therapy Evaluation Patient Details Name: [name] MRN: [id number] DOB: 1946-07-30 Today's Date: 01/22/2012  Problem List:  Patient Active Problem List  Diagnoses  . CARCINOMA, LUNG, SMALL CELL  . DIABETES MELLITUS, TYPE II  . HYPERLIPIDEMIA  . GERD  . TOBACCO USE, QUIT  . COPD (chronic obstructive pulmonary disease)  . COPD exacerbation    Past Medical History:  Past Medical History  Diagnosis Date  . Diabetes mellitus   . Hyperlipidemia   . GERD (gastroesophageal reflux disease)   . Lung cancer     non-small cell lung ca, stage III in 05/2011, s/p chemo/radiation  . COPD (chronic obstructive pulmonary disease)   . CAD (coronary artery disease)     NON obstructive. Cath 2006 preserved LV fxn, scattered irregularities without critical stenosis. 2008 stress echo negative for ischemia, but with hypertensive response  . History of diverticulitis of colon    Past Surgical History:  Past Surgical History  Procedure Date  . Appendectomy   . Abdominal hysterectomy 1997  . Carpal tunnel release     PT Assessment/Plan/Recommendation PT Assessment Clinical Impression Statement: 66 yo w/COPD awaiting d/c home presents with fully independent functional mobility.  Pt verbalizes understanding of pulmonary medication usage and agrees to seek assistance from her primary medical team if any questions.  Denies any significant fatigue or dyspnea limiting activity.  Verbalizes use of pursed lip breathing as needed, endorses being physically active and agrees to continue or increase level of physical activity.  Educated on time/intensity of exercise to include <3-4/10 dyspnea and 5-6/10 intensity of exercise, to perform multiple short bouts when longer distances aren't practical, and to follow medication usage as prescribed.  No other PT needs identified and pt d/c'd home. PT Recommendation/Assessment: Patent does not need any further PT services No Skilled PT: All education  completed;Patient is independent with all acitivity/mobility PT Recommendation Follow Up Recommendations: No PT follow up Equipment Recommended: None recommended by PT PT Goals     PT Evaluation Precautions/Restrictions  Precautions Required Braces or Orthoses: No Restrictions Weight Bearing Restrictions: No Prior Functioning  Home Living Lives With: Family Receives Help From: Family Type of Home: House Home Layout: One level Home Adaptive Equipment: None Additional Comments: has home nebulizer machine Prior Function Level of Independence: Independent with basic ADLs;Independent with homemaking with ambulation;Independent with gait;Independent with transfers Able to Take Stairs?: Yes Driving: Yes Cognition Cognition Arousal/Alertness: Awake/alert Overall Cognitive Status: Appears within functional limits for tasks assessed Sensation/Coordination Sensation Light Touch: Appears Intact Coordination Gross Motor Movements are Fluid and Coordinated: Yes Fine Motor Movements are Fluid and Coordinated: Yes Extremity Assessment RUE Assessment RUE Assessment: Within Functional Limits LUE Assessment LUE Assessment: Within Functional Limits RLE Assessment RLE Assessment: Within Functional Limits LLE Assessment LLE Assessment: Within Functional Limits Mobility (including Balance) Bed Mobility Bed Mobility: Yes Supine to Sit: 7: Independent Transfers Transfers: Yes Sit to Stand: 7: Independent Ambulation/Gait Ambulation/Gait: Yes Ambulation/Gait Assistance: 7: Independent Gait Pattern: Within Functional Limits Stairs: No Wheelchair Mobility Wheelchair Mobility: No  Posture/Postural Control Posture/Postural Control: No significant limitations Balance Balance Assessed: No Exercise    End of Session PT - End of Session Activity Tolerance: Patient tolerated treatment well Patient left: in bed Nurse Communication: Mobility status for ambulation General Behavior  During Session: [facility] for tasks performed Cognition: [facility] for tasks performed  Dennis Bast 01/22/2012, 2:09 PM

## 2012-01-22 NOTE — Progress Notes (Signed)
[name] [id number] Code Status: Full   Admission Data: 01/22/2012 1:50 AM Attending Provider:  Ghimire WUJ:WJXBJY,NWGNF HENRY, MD, MD Consults/ Treatment Team:    [name] is a 66 y.o. female patient admitted from ED awake, alert - oriented  X 3 - no acute distress noted.  VSS - Blood pressure 92/59, pulse 98, temperature 97.5 F (36.4 C), temperature source Oral, resp. rate 20, height 5\' 2"  (1.575 m), weight 81.5 kg (179 lb 10.8 oz), SpO2 96.00%.  no c/o shortness of breath, no c/o chest pain. Cardiac tele # [phone], in place, cardiac monitor yields:sinus tachycardia. O2:   @ 2.5 l/min per [employer]. IV Fluids:  IV in place, occlusive dsg intact without redness, IV cath antecubital right, condition patent and no redness none.  Allergies:   Allergies  Allergen Reactions  . Ibuprofen     REACTION: Anxiousness, hypoventilate      Past Medical History  Diagnosis Date  . Diabetes mellitus   . Hyperlipidemia   . GERD (gastroesophageal reflux disease)   . Lung cancer     non-small cell lung ca, stage III in 05/2011, s/p chemo/radiation  . COPD (chronic obstructive pulmonary disease)   . CAD (coronary artery disease)     NON obstructive. Cath 2006 preserved LV fxn, scattered irregularities without critical stenosis. 2008 stress echo negative for ischemia, but with hypertensive response  . History of diverticulitis of colon    Medications Prior to Admission  Medication Dose Route Frequency Provider Last Rate Last Dose  . 0.9 %  sodium chloride infusion   Intravenous STAT [name], MD      . 0.9 %  sodium chloride infusion  250 mL Intravenous PRN Devonne Doughty, MD      . acetaminophen (TYLENOL) tablet 650 mg  650 mg Oral Q6H PRN Devonne Doughty, MD       Or  . acetaminophen (TYLENOL) suppository 650 mg  650 mg Rectal Q6H PRN Devonne Doughty, MD      . albuterol (PROVENTIL) (5 MG/ML) 0.5% nebulizer solution 2.5 mg  2.5 mg Nebulization Q6H PRN Devonne Doughty, MD      . albuterol  (PROVENTIL) (5 MG/ML) 0.5% nebulizer solution 5 mg  5 mg Nebulization Once [name], MD   5 mg at 01/21/12 1645  . albuterol (PROVENTIL,VENTOLIN) solution continuous neb  10 mg/hr Nebulization Once [name], MD   10 mg/hr at 01/21/12 1226  . aspirin EC tablet 81 mg  81 mg Oral Daily Devonne Doughty, MD      . docusate sodium (COLACE) capsule 100 mg  100 mg Oral BID Devonne Doughty, MD   100 mg at 01/21/12 2223  . guaiFENesin (MUCINEX) 12 hr tablet 600 mg  600 mg Oral BID Devonne Doughty, MD   600 mg at 01/21/12 2223  . heparin injection 5,000 Units  5,000 Units Subcutaneous Q8H Devonne Doughty, MD   5,000 Units at 01/21/12 2224  . insulin aspart protamine-insulin aspart (NOVOLOG 70/30) injection 20 Units  20 Units Subcutaneous Q breakfast Devonne Doughty, MD      . insulin aspart protamine-insulin aspart (NOVOLOG 70/30) injection 22 Units  22 Units Subcutaneous Q supper Devonne Doughty, MD      . ipratropium (ATROVENT) nebulizer solution 0.5 mg  0.5 mg Nebulization Once [name], MD   0.5 mg at 01/21/12 1226  . ipratropium (ATROVENT) nebulizer solution 0.5 mg  0.5 mg Nebulization  Q6H Devonne Doughty, MD   0.5 mg at 01/21/12 2348  . moxifloxacin (AVELOX) IVPB 400 mg  400 mg Intravenous Once [name], MD 250 mL/hr at 01/21/12 1716 400 mg at 01/21/12 1716  . ondansetron (ZOFRAN) tablet 4 mg  4 mg Oral Q6H PRN Devonne Doughty, MD       Or  . ondansetron (ZOFRAN) injection 4 mg  4 mg Intravenous Q6H PRN Devonne Doughty, MD      . polyethylene glycol (MIRALAX / GLYCOLAX) packet 17 g  17 g Oral Daily PRN Devonne Doughty, MD      . potassium chloride SA (K-DUR,KLOR-CON) CR tablet 20 mEq  20 mEq Oral Once [name], MD   20 mEq at 01/21/12 1639  . predniSONE (DELTASONE) tablet 10 mg  10 mg Oral Q breakfast Devonne Doughty, MD      . predniSONE (DELTASONE) tablet 60 mg  60 mg Oral Once [name], MD   60 mg at 01/21/12 1502  . senna (SENOKOT) tablet 8.6 mg  1 tablet Oral BID Devonne Doughty,  MD   8.6 mg at 01/21/12 2223  . simvastatin (ZOCOR) tablet 10 mg  10 mg Oral QHS Devonne Doughty, MD   10 mg at 01/21/12 2223  . sodium chloride 0.9 % injection 3 mL  3 mL Intravenous Q12H Devonne Doughty, MD      . sodium chloride 0.9 % injection 3 mL  3 mL Intravenous PRN Devonne Doughty, MD      . DISCONTD: albuterol (PROVENTIL) (5 MG/ML) 0.5% nebulizer solution 2.5 mg  2.5 mg Nebulization Q4H [name], MD      . DISCONTD: aspirin tablet 81 mg  81 mg Oral Daily Devonne Doughty, MD      . DISCONTD: insulin NPH-insulin regular (NOVOLIN 70/30) injection 20-22 Units  20-22 Units Subcutaneous BID WC Devonne Doughty, MD       Medications Prior to Admission  Medication Sig Dispense Refill  . Insulin Isophane & Regular (HUMULIN 70/30 [place]) Inject 20 Units into the skin 2 (two) times daily. Takes 20 units with breakfast, 22 units with dinner      . loratadine (CLARITIN) 10 MG tablet Take 10 mg by mouth daily.        . nitroGLYCERIN (NITROSTAT) 0.4 MG SL tablet Place 0.4 mg under the tongue every 5 (five) minutes as needed. For chest pain      . PROAIR HFA 108 (90 BASE) MCG/ACT inhaler Inhale 2 puffs into the lungs daily.       History:  obtained from the patient. Tobacco/alcohol: Smoked 2 packs per day for 25 years none  Orientation to room, and floor completed with information packet given to patient/family.  Patient declined safety video at this time.  Admission INP armband ID verified with patient/family, and in place.   SR up x 2, fall assessment complete, with patient and family able to verbalize understanding of risk associated with falls, and verbalized understanding to call nsg before up out of bed.  Call light within reach, patient able to voice, and demonstrate understanding.  Skin, clean-dry- intact without evidence of bruising, or skin tears.   No evidence of skin break down noted on exam.     Will cont to eval and treat per MD orders.  Kennyth Arnold D, RN 01/22/2012 1:50 AM

## 2012-01-22 NOTE — Progress Notes (Signed)
Pt was ambulated in hallway at 0945 today accompanied by NT. She ambulated 150 ft without oxygen and maintained an O2 of 97% on room air. Pt tolerated ambulation well. Peter Congo RN

## 2012-01-22 NOTE — Discharge Summary (Signed)
PATIENT DETAILS Name: [name] Age: 66 y.o. Sex: female Date of Birth: 1946/01/04 MRN: [id number]. Admit Date: 01/21/2012 Admitting Physician: Devonne Doughty, MD NWG:[id number],YQMVH Sherilyn Cooter, MD, MD  PRIMARY DISCHARGE DIAGNOSIS:  Principal Problem:  *COPD exacerbation Active Problems:  CARCINOMA, LUNG, SMALL CELL  DIABETES MELLITUS, TYPE II  HYPERLIPIDEMIA  COPD (chronic obstructive pulmonary disease)      PAST MEDICAL HISTORY: Past Medical History  Diagnosis Date  . Diabetes mellitus   . Hyperlipidemia   . GERD (gastroesophageal reflux disease)   . Lung cancer     non-small cell lung ca, stage III in 05/2011, s/p chemo/radiation  . COPD (chronic obstructive pulmonary disease)   . CAD (coronary artery disease)     NON obstructive. Cath 2006 preserved LV fxn, scattered irregularities without critical stenosis. 2008 stress echo negative for ischemia, but with hypertensive response  . History of diverticulitis of colon     DISCHARGE MEDICATIONS: Medication List  As of 01/22/2012 10:58 AM   STOP taking these medications         PROAIR HFA 108 (90 BASE) MCG/ACT inhaler         TAKE these medications         albuterol 108 (90 BASE) MCG/ACT inhaler   Commonly known as: PROVENTIL HFA;VENTOLIN HFA   Inhale 2 puffs into the lungs every 6 (six) hours as needed for wheezing or shortness of breath. For shortness of breath      aspirin 81 MG tablet   Take 81 mg by mouth daily.      azithromycin 250 MG tablet   Commonly known as: ZITHROMAX   250 mg, Oral, Daily, First dose on Mon 01/23/12 at 1000, For 4 doses      guaiFENesin 600 MG 12 hr tablet   Commonly known as: MUCINEX   Take 1 tablet (600 mg total) by mouth 2 (two) times daily.      HUMULIN 70/30 [place]   Inject 20 Units into the skin 2 (two) times daily. Takes 20 units with breakfast, 22 units with dinner      loratadine 10 MG tablet   Commonly known as: CLARITIN   Take 10 mg by mouth daily.      multivitamin capsule   Take 1 capsule by mouth daily.      nitroGLYCERIN 0.4 MG SL tablet   Commonly known as: NITROSTAT   Place 0.4 mg under the tongue every 5 (five) minutes as needed. For chest pain      predniSONE 10 MG tablet   Commonly known as: DELTASONE   Take 4 tablets for 3 days, then  Take 3 tablets for 3 days, then  Take 2 tablets for 3 days, then  Take 1 tablet for 1 day and then stop      simvastatin 10 MG tablet   Commonly known as: ZOCOR   Take 10 mg by mouth at bedtime.      tiotropium 18 MCG inhalation capsule   Commonly known as: SPIRIVA   Place 1 capsule (18 mcg total) into inhaler and inhale daily.             BRIEF HPI:  See H&P, Labs, Consult and Test reports for all details in brief, patient was admitted for worsening shortness of breath and cough.states she was in usual state of health (including not having chronic dyspnea or cough, no home O2) until about 2 weeks ago when she  developed a cough minimally productive of whitish sputum that  has progressively worsened and caused her increasing dyspean with minimal exertion. She has also had loss of voice over the past couple weeks, without sore throat, but this has now resolved.    CONSULTATIONS:   None  PERTINENT RADIOLOGIC STUDIES: Dg Chest 2 View  01/21/2012  *RADIOLOGY REPORT*  Clinical Data: History of cough.  History of lung cancer.  CHEST - 2 VIEW  Comparison: Multiple priors, most recently chest x-ray 06/19/2011, and chest c t 08/17/2011.  Findings: Again noted is a focal mass-like opacity in the medial aspect of the right upper lobe with surrounding architectural distortion and chronic volume loss as evidenced by cephalad displacement of the horizontal fissure and elevation of the right hemidiaphragm.  These findings are chronic.  No definite acute airspace consolidation.  No pleural effusions.  No evidence of pulmonary edema.  Heart size is normal.  Slight shift of the mediastinal structures to the right related to  chronic right-sided volume loss.  Mediastinal contours are otherwise unremarkable. Surgical clips projecting over the right upper quadrant of the abdomen, likely from prior cholecystectomy.  IMPRESSION: 1.  Extensive post treatment related changes from radiation therapy with chronic volume loss and architectural distortion in the right upper lobe, as above.  These findings appear unchanged, and there is no radiographic evidence to suggest superimposed acute cardiopulmonary disease on today's chest x-ray.  Original Report Authenticated By: Florencia Reasons, M.D.     PERTINENT LAB RESULTS: CBC:  Basename 01/22/12 0612 01/21/12 1423  WBC 6.4 7.4  HGB 12.6 12.9  HCT 37.8 38.6  PLT 154 140*   CMET CMP     Component Value Date/Time   NA 142 01/22/2012 0612   NA 145 08/17/2011 0949   K 4.2 01/22/2012 0612   K 4.1 08/17/2011 0949   CL 109 01/22/2012 0612   CL 101 08/17/2011 0949   CO2 24 01/22/2012 0612   CO2 31 08/17/2011 0949   GLUCOSE 233* 01/22/2012 0612   GLUCOSE 275* 08/17/2011 0949   BUN 13 01/22/2012 0612   BUN 11 08/17/2011 0949   CREATININE 0.56 01/22/2012 0612   CREATININE 0.6 08/17/2011 0949   CALCIUM 9.2 01/22/2012 0612   CALCIUM 8.4 08/17/2011 0949   PROT 7.4 12/29/2011 0842   PROT 7.2 08/17/2011 0949   ALBUMIN 4.1 12/29/2011 0842   AST 24 12/29/2011 0842   AST 22 08/17/2011 0949   ALT 19 12/29/2011 0842   ALKPHOS 75 12/29/2011 0842   ALKPHOS 96* 08/17/2011 0949   BILITOT 0.4 12/29/2011 0842   BILITOT 0.50 08/17/2011 0949   GFRNONAA >90 01/22/2012 0612   GFRAA >90 01/22/2012 0612    GFR Estimated Creatinine Clearance: 71.2 ml/min (by C-G formula based on Cr of 0.56). No results found for this basename: LIPASE:2,AMYLASE:2 in the last 72 hours No results found for this basename: CKTOTAL:3,CKMB:3,CKMBINDEX:3,TROPONINI:3 in the last 72 hours No components found with this basename: POCBNP:3 No results found for this basename: DDIMER:2 in the last 72 hours No results found for this  basename: HGBA1C:2 in the last 72 hours No results found for this basename: CHOL:2,HDL:2,LDLCALC:2,TRIG:2,CHOLHDL:2,LDLDIRECT:2 in the last 72 hours No results found for this basename: TSH,T4TOTAL,FREET3,T3FREE,THYROIDAB in the last 72 hours No results found for this basename: VITAMINB12:2,FOLATE:2,FERRITIN:2,TIBC:2,IRON:2,RETICCTPCT:2 in the last 72 hours Coags: No results found for this basename: PT:2,INR:2 in the last 72 hours Microbiology: No results found for this or any previous visit (from the past 240 hour(s)).   BRIEF HOSPITAL COURSE:   Principal Problem:  *COPD exacerbation -  Significantly better this morning, her lungs are clear and patient has ambulated in the hallway without any difficulty. Her saturations were in the high 90s during ambulation without oxygen. She is very anxious to go home and celebrate O[place] with family, since she is doing so well I will discharge her at her own request with the above noted medications she has been asked to call her primary care practitioner in the next few days to make a follow up appointment. She has also been asked to return to the emergency room if she were to have her prior symptoms. She'll be discharged on Zithromax along with a prednisone taper. She has been prescribed albuterol inhaler and can maintain and Spiriva inhaler.  Active Problems:  CARCINOMA, LUNG, SMALL CELL -She is to followup with her primary oncologist as previously scheduled.   DIABETES MELLITUS, TYPE II -Continue her usual insulin regimen at discharge   HYPERLIPIDEMIA -Continue statin  TODAY-DAY OF DISCHARGE:  Subjective:   Terrence Pizana today has no headache,no chest abdominal pain,no new weakness tingling or numbness, feels much better wants to go home today. She feels significantly better today, she has ambulated in the hallway and does not have any shortness of breath at all and is requesting that she be discharged home for the holidays.  Objective:   Blood  pressure 97/61, pulse 91, temperature 97.9 F (36.6 C), temperature source Oral, resp. rate 20, height 5\' 2"  (1.575 m), weight 81.5 kg (179 lb 10.8 oz), SpO2 98.00%.  Intake/Output Summary (Last 24 hours) at 01/22/12 1058 Last data filed at 01/22/12 0100  Gross per 24 hour  Intake 1599.58 ml  Output   1200 ml  Net 399.58 ml    Exam Awake Alert, Oriented *3, No new F.N deficits, Normal affect [place].AT,PERRAL Supple Neck,No JVD, No cervical lymphadenopathy appriciated.  Symmetrical Chest wall movement, Good air movement bilaterally, lungs are currently clear on exam. RRR,No Gallops,Rubs or new Murmurs, No Parasternal Heave +ve B.Sounds, Abd Soft, Non tender, No organomegaly appriciated, No rebound -guarding or rigidity. No Cyanosis, Clubbing or edema, No new Rash or bruise  DISPOSITION: Home   DISCHARGE INSTRUCTIONS:    Follow-up Information    Follow up with [name], MD. Schedule an appointment as soon as possible for a visit in 5 days.        Total Time spent on discharge equals 45 minutes.  SignedJeoffrey Massed 01/22/2012 10:58 AM

## 2012-01-23 NOTE — Progress Notes (Signed)
Utilization Review Completed.[name] T4/10/2011

## 2012-01-24 ENCOUNTER — Encounter: Payer: Self-pay | Admitting: Internal Medicine

## 2012-01-24 ENCOUNTER — Ambulatory Visit (INDEPENDENT_AMBULATORY_CARE_PROVIDER_SITE_OTHER): Payer: Medicare Other | Admitting: Internal Medicine

## 2012-01-24 VITALS — BP 118/74 | HR 80 | Temp 97.9°F | Wt 176.0 lb

## 2012-01-24 DIAGNOSIS — E119 Type 2 diabetes mellitus without complications: Secondary | ICD-10-CM

## 2012-01-24 DIAGNOSIS — J449 Chronic obstructive pulmonary disease, unspecified: Secondary | ICD-10-CM

## 2012-01-24 MED ORDER — INSULIN NPH ISOPHANE & REGULAR (70-30) 100 UNIT/ML ~~LOC~~ SUSP: SUBCUTANEOUS | Status: DC

## 2012-01-24 NOTE — Progress Notes (Signed)
Patient ID: [name], female   DOB: 02-12-1946, 66 y.o.   MRN: [id number] Recent COPD exacerbation- reviewed hospital note. On prednisone- CBGs in a.m. Are 130s while on prednisone  DM--Cbgs up to 190s before dinner (this was before she was on prednsone).  Past Medical History  Diagnosis Date  . Diabetes mellitus   . Hyperlipidemia   . GERD (gastroesophageal reflux disease)   . Lung cancer     non-small cell lung ca, stage III in 05/2011, s/p chemo/radiation  . COPD (chronic obstructive pulmonary disease)   . CAD (coronary artery disease)     NON obstructive. Cath 2006 preserved LV fxn, scattered irregularities without critical stenosis. 2008 stress echo negative for ischemia, but with hypertensive response  . History of diverticulitis of colon     History   Social History  . Marital Status: Married    Spouse Name: N/A    Number of Children: N/A  . Years of Education: N/A   Occupational History  . Not on file.   Social History Main Topics  . Smoking status: Former Smoker -- 1.0 packs/day for 45 years    Types: Cigarettes    Quit date: 10/24/2008  . Smokeless tobacco: Not on file  . Alcohol Use: Not on file  . Drug Use: Not on file  . Sexually Active: Not on file   Other Topics Concern  . Not on file   Social History Narrative   Lives in [place], has husband and daughter and son. Daughter's family is living with her and husband at present. She is ambulatory daily without cane or walker.     Past Surgical History  Procedure Date  . Appendectomy   . Abdominal hysterectomy 1997  . Carpal tunnel release     Family History  Problem Relation Age of Onset  . Heart failure Mother   . Kidney disease Mother     renal failure  . Diabetes Mother   . Diabetes Father   . Diabetes Brother   . Heart disease Brother     Allergies  Allergen Reactions  . Lactose Intolerance (Gi) Diarrhea  . Ibuprofen     REACTION: Anxiousness, hypoventilate     Current  Outpatient Prescriptions on File Prior to Visit  Medication Sig Dispense Refill  . albuterol (PROVENTIL HFA;VENTOLIN HFA) 108 (90 BASE) MCG/ACT inhaler Inhale 2 puffs into the lungs every 6 (six) hours as needed for wheezing or shortness of breath. For shortness of breath  1 Inhaler  0  . aspirin 81 MG tablet Take 81 mg by mouth daily.      Marland Kitchen azithromycin (ZITHROMAX) 250 MG tablet 250 mg, Oral, Daily, First dose on Mon 01/23/12 at 1000, For 4 doses  4 each  0  . guaiFENesin (MUCINEX) 600 MG 12 hr tablet Take 1 tablet (600 mg total) by mouth 2 (two) times daily.  14 tablet  0  . Insulin Isophane & Regular (HUMULIN 70/30 [place]) Inject 20 Units into the skin 2 (two) times daily. Takes 20 units with breakfast, 22 units with dinner      . loratadine (CLARITIN) 10 MG tablet Take 10 mg by mouth daily.        . Multiple Vitamin (MULTIVITAMIN) capsule Take 1 capsule by mouth daily.      . nitroGLYCERIN (NITROSTAT) 0.4 MG SL tablet Place 0.4 mg under the tongue every 5 (five) minutes as needed. For chest pain      . predniSONE (DELTASONE) 10 MG tablet Take  4 tablets for 3 days, then Take 3 tablets for 3 days, then Take 2 tablets for 3 days, then Take 1 tablet for 1 day and then stop  28 tablet  0  . simvastatin (ZOCOR) 10 MG tablet Take 10 mg by mouth at bedtime.      Marland Kitchen tiotropium (SPIRIVA HANDIHALER) 18 MCG inhalation capsule Place 1 capsule (18 mcg total) into inhaler and inhale daily.  30 capsule  12     patient denies chest pain, shortness of breath, orthopnea. Denies lower extremity edema, abdominal pain, change in appetite, change in bowel movements. Patient denies rashes, musculoskeletal complaints. No other specific complaints in a complete review of systems.   BP 118/74  Pulse 80  Temp(Src) 97.9 F (36.6 C) (Oral)  Wt 176 lb (79.833 kg)  SpO2 97%  Well-developed well-nourished female in no acute distress. HEENT exam atraumatic, normocephalic, extraocular muscles are intact. Neck is supple. No  jugular venous distention no thyromegaly. Chest clear to auscultation without increased work of breathing. Cardiac exam S1 and S2 are regular. Abdominal exam active bowel sounds, soft, nontender.

## 2012-01-24 NOTE — Assessment & Plan Note (Signed)
Recent hospitalization She complete azithromycin and prednisone. Continue spiriva. She will call for any sxs

## 2012-01-24 NOTE — Assessment & Plan Note (Signed)
Increase insulin 

## 2012-02-16 ENCOUNTER — Ambulatory Visit (HOSPITAL_COMMUNITY)
Admission: RE | Admit: 2012-02-16 | Discharge: 2012-02-16 | Disposition: A | Discharge status: Home / Self Care | Payer: Medicare Other | Source: Ambulatory Visit | Attending: Internal Medicine | Admitting: Internal Medicine

## 2012-02-16 ENCOUNTER — Other Ambulatory Visit (HOSPITAL_BASED_OUTPATIENT_CLINIC_OR_DEPARTMENT_OTHER): Payer: Medicare Other | Admitting: Lab

## 2012-02-16 DIAGNOSIS — Y842 Radiological procedure and radiotherapy as the cause of abnormal reaction of the patient, or of later complication, without mention of misadventure at the time of the procedure: Secondary | ICD-10-CM | POA: Insufficient documentation

## 2012-02-16 DIAGNOSIS — R05 Cough: Secondary | ICD-10-CM | POA: Insufficient documentation

## 2012-02-16 DIAGNOSIS — C3410 Malignant neoplasm of upper lobe, unspecified bronchus or lung: Secondary | ICD-10-CM

## 2012-02-16 DIAGNOSIS — J701 Chronic and other pulmonary manifestations due to radiation: Secondary | ICD-10-CM | POA: Insufficient documentation

## 2012-02-16 DIAGNOSIS — E042 Nontoxic multinodular goiter: Secondary | ICD-10-CM | POA: Insufficient documentation

## 2012-02-16 DIAGNOSIS — R079 Chest pain, unspecified: Secondary | ICD-10-CM | POA: Insufficient documentation

## 2012-02-16 DIAGNOSIS — R918 Other nonspecific abnormal finding of lung field: Secondary | ICD-10-CM | POA: Insufficient documentation

## 2012-02-16 DIAGNOSIS — R059 Cough, unspecified: Secondary | ICD-10-CM | POA: Insufficient documentation

## 2012-02-16 DIAGNOSIS — I7090 Unspecified atherosclerosis: Secondary | ICD-10-CM | POA: Insufficient documentation

## 2012-02-16 DIAGNOSIS — C3490 Malignant neoplasm of unspecified part of unspecified bronchus or lung: Secondary | ICD-10-CM | POA: Insufficient documentation

## 2012-02-16 DIAGNOSIS — R0602 Shortness of breath: Secondary | ICD-10-CM | POA: Insufficient documentation

## 2012-02-16 DIAGNOSIS — M549 Dorsalgia, unspecified: Secondary | ICD-10-CM | POA: Insufficient documentation

## 2012-02-16 LAB — CBC WITH DIFFERENTIAL/PLATELET
Basophils Absolute: 0 10*3/uL (ref 0.0–0.1)
EOS%: 3.7 % (ref 0.0–7.0)
HGB: 13.6 g/dL (ref 11.6–15.9)
LYMPH%: 17.6 % (ref 14.0–49.7)
MCH: 31.6 pg (ref 25.1–34.0)
MCV: 93.2 fL (ref 79.5–101.0)
MONO%: 9.5 % (ref 0.0–14.0)
Platelets: 204 10*3/uL (ref 145–400)
RBC: 4.31 10*6/uL (ref 3.70–5.45)
RDW: 15.2 % — ABNORMAL HIGH (ref 11.2–14.5)

## 2012-02-16 LAB — CMP (CANCER CENTER ONLY)
Alkaline Phosphatase: 75 U/L (ref 26–84)
CO2: 28 mEq/L (ref 18–33)
Creat: 0.9 mg/dl (ref 0.6–1.2)
Glucose, Bld: 285 mg/dL — ABNORMAL HIGH (ref 73–118)
Sodium: 141 mEq/L (ref 128–145)
Total Bilirubin: 0.7 mg/dl (ref 0.20–1.60)
Total Protein: 7.1 g/dL (ref 6.4–8.1)

## 2012-02-16 MED ORDER — IOHEXOL 300 MG/ML  SOLN
80.0000 mL | Freq: Once | INTRAMUSCULAR | Status: AC | PRN
  Administered 2012-02-16: 80 mL via INTRAVENOUS

## 2012-02-21 ENCOUNTER — Ambulatory Visit (HOSPITAL_BASED_OUTPATIENT_CLINIC_OR_DEPARTMENT_OTHER): Payer: Medicare Other | Admitting: Internal Medicine

## 2012-02-21 ENCOUNTER — Telehealth: Payer: Self-pay | Admitting: Internal Medicine

## 2012-02-21 VITALS — BP 113/71 | HR 99 | Temp 96.8°F | Ht 62.0 in | Wt 179.5 lb

## 2012-02-21 DIAGNOSIS — C3490 Malignant neoplasm of unspecified part of unspecified bronchus or lung: Secondary | ICD-10-CM

## 2012-02-21 DIAGNOSIS — C3410 Malignant neoplasm of upper lobe, unspecified bronchus or lung: Secondary | ICD-10-CM

## 2012-02-21 NOTE — Telephone Encounter (Signed)
Gave pt appt calendar for October 2013 lab, CT and MD

## 2012-02-21 NOTE — Progress Notes (Signed)
[facility] Telephone:([phone]) [phone]   [phone]  OFFICE PROGRESS NOTE  [name], MD, MD [address] [place] [address]  PRINCIPAL DIAGNOSIS:  Limited stage small cell lung cancer diagnosed in March of 2010.  PRIOR THERAPY:   1. Status post 4 cycles of systemic chemotherapy with carboplatin and etoposide concurrent with radiation during cycle 2 and 3.  The last dose of chemotherapy was given April 01, 2009. 2. Status post prophylactic cranial irradiation under the care of Dr. Mitzi Hansen completed May 19, 2009.  CURRENT THERAPY:  Observation.  INTERVAL HISTORY: [name] 66 y.o. female returns to the clinic today for routine six-month followup visit accompanied by her husband. The patient has no complaints today. She denied having any significant weight loss or night sweats. She has no chest pain or shortness breath, no cough or hemoptysis. She has repeat CT scan of the chest performed recently and she is here today for evaluation and discussion of her scan results.  MEDICAL HISTORY: Past Medical History  Diagnosis Date  . Diabetes mellitus   . Hyperlipidemia   . GERD (gastroesophageal reflux disease)   . Lung cancer     non-small cell lung ca, stage III in 05/2011, s/p chemo/radiation  . COPD (chronic obstructive pulmonary disease)   . CAD (coronary artery disease)     NON obstructive. Cath 2006 preserved LV fxn, scattered irregularities without critical stenosis. 2008 stress echo negative for ischemia, but with hypertensive response  . History of diverticulitis of colon     ALLERGIES:  is allergic to lactose intolerance (gi) and ibuprofen.  MEDICATIONS:  Current Outpatient Prescriptions  Medication Sig Dispense Refill  . albuterol (PROVENTIL HFA;VENTOLIN HFA) 108 (90 BASE) MCG/ACT inhaler Inhale 2 puffs into the lungs every 6 (six) hours as needed for wheezing or shortness of breath. For shortness of breath  1 Inhaler  0  .  aspirin 81 MG tablet Take 81 mg by mouth daily.      Marland Kitchen guaiFENesin (MUCINEX) 600 MG 12 hr tablet Take 1 tablet (600 mg total) by mouth 2 (two) times daily.  14 tablet  0  . insulin NPH-insulin regular (NOVOLIN 70/30) (70-30) 100 UNIT/ML injection 25 units in a.m. And 22 units in the evening. Take with meals  60 mL  12  . loratadine (CLARITIN) 10 MG tablet Take 10 mg by mouth daily.        . Multiple Vitamin (MULTIVITAMIN) capsule Take 1 capsule by mouth daily.      . nitroGLYCERIN (NITROSTAT) 0.4 MG SL tablet Place 0.4 mg under the tongue every 5 (five) minutes as needed. For chest pain      . predniSONE (DELTASONE) 10 MG tablet Take 4 tablets for 3 days, then Take 3 tablets for 3 days, then Take 2 tablets for 3 days, then Take 1 tablet for 1 day and then stop  28 tablet  0  . simvastatin (ZOCOR) 10 MG tablet Take 10 mg by mouth at bedtime.      Marland Kitchen tiotropium (SPIRIVA HANDIHALER) 18 MCG inhalation capsule Place 1 capsule (18 mcg total) into inhaler and inhale daily.  30 capsule  12    SURGICAL HISTORY:  Past Surgical History  Procedure Date  . Appendectomy   . Abdominal hysterectomy 1997  . Carpal tunnel release     REVIEW OF SYSTEMS:  A comprehensive review of systems was negative.   PHYSICAL EXAMINATION: General appearance: alert, cooperative and no distress  Head: Normocephalic, without obvious abnormality, atraumatic Neck: no adenopathy Lymph nodes: Cervical, supraclavicular, and axillary nodes normal. Resp: clear to auscultation bilaterally Cardio: regular rate and rhythm, S1, S2 normal, no murmur, click, rub or gallop GI: soft, non-tender; bowel sounds normal; no masses,  no organomegaly Extremities: extremities normal, atraumatic, no cyanosis or edema Neurologic: Alert and oriented X 3, normal strength and tone. Normal symmetric reflexes. Normal coordination and gait  ECOG PERFORMANCE STATUS: 0 - Asymptomatic  Blood pressure 113/71, pulse 99, temperature 96.8 F (36 C),  temperature source Oral, height 5\' 2"  (1.575 m), weight 179 lb 8 oz (81.421 kg).  LABORATORY DATA: Lab Results  Component Value Date   WBC 5.8 02/16/2012   HGB 13.6 02/16/2012   HCT 40.2 02/16/2012   MCV 93.2 02/16/2012   PLT 204 02/16/2012      Chemistry      Component Value Date/Time   NA 141 02/16/2012 0803   NA 142 01/22/2012 0612   K 3.9 02/16/2012 0803   K 4.2 01/22/2012 0612   CL 99 02/16/2012 0803   CL 109 01/22/2012 0612   CO2 28 02/16/2012 0803   CO2 24 01/22/2012 0612   BUN 12 02/16/2012 0803   BUN 13 01/22/2012 0612   CREATININE 0.9 02/16/2012 0803   CREATININE 0.56 01/22/2012 0612      Component Value Date/Time   CALCIUM 8.7 02/16/2012 0803   CALCIUM 9.2 01/22/2012 0612   ALKPHOS 75 02/16/2012 0803   ALKPHOS 75 12/29/2011 0842   AST 20 02/16/2012 0803   AST 24 12/29/2011 0842   ALT 19 12/29/2011 0842   BILITOT 0.70 02/16/2012 0803   BILITOT 0.4 12/29/2011 0842       RADIOGRAPHIC STUDIES: Ct Chest W Contrast  02/16/2012  *RADIOLOGY REPORT*  Clinical Data: Lung cancer, back pain, chest pain, shortness of breath and cough.  CT CHEST WITH CONTRAST  Technique:  Multidetector CT imaging of the chest was performed following the standard protocol during bolus administration of intravenous contrast.  Contrast: 80mL OMNIPAQUE IOHEXOL 300 MG/ML  SOLN  Comparison: CT abdomen pelvis 10/02/2011 and CT chest 06/19/2011, 02/21/2011.  Findings: Sub centimeter nodules are again seen in the thyroid.  No pathologically enlarged mediastinal, left hilar or axillary lymph nodes.  Atherosclerotic calcification of the arterial vasculature. Heart size normal.  No pericardial effusion.  Radiation fibrosis and volume loss in the upper right hemithorax, as before.  A 4 mm nodule in the right lower lobe (image 30) is unchanged from 02/21/2011. 2 mm left lower lobe nodule (image 36) is also unchanged.  No pleural fluid.  Small amount of debris seen dependently in the lower trachea.  Incidental imaging of the upper  abdomen shows slightly decreased attenuation throughout the visualized portion of the liver.  A tiny blush of hyper attenuation is seen in the hepatic dome, as before, and may represent a flash fill hemangioma or small perfusion anomaly.  No worrisome lytic or sclerotic lesions.  IMPRESSION:  1.  Radiation fibrosis and volume loss in the upper right hemithorax, stable.  No evidence of recurrent or metastatic disease. 2.  Suspect hepatic steatosis.  Original Report Authenticated By: Reyes Ivan, M.D.    ASSESSMENT: This is a very pleasant 66 years old white female with history of limited stage small cell lung cancer status post systemic chemotherapy concurrent with radiation followed by prophylactic cranial irradiation. The patient is doing fine and she has no evidence for disease recurrence.  PLAN: I discussed the scan  results with the patient her husband. I recommended for her continuous observation for now with repeat CT scan of the chest in 6 months. She was advised to call me immediately she has any concerning symptoms in the interval.  All questions were answered. The patient knows to call the clinic with any problems, questions or concerns. We can certainly see the patient much sooner if necessary.

## 2012-04-21 IMAGING — CT CT ANGIO CHEST
2 of 8 series · 14 of 36 positions shown · IV contrast (CONTRAST)
Comparison: Previous examinations, including the portable chest
obtained earlier today and chest CT dated 02/21/2011.

CLINICAL DATA: Shortness of breath, worse on exertion.  Elevated D-
dimer.  Concern for the possibility of tumor progression in the
right lung on a portable chest obtained earlier today.  History of
lung cancer treated with radiation therapy and chemotherapy.

CT ANGIOGRAPHY CHEST WITH CONTRAST
TECHNIQUE: Multidetector CT imaging of the chest was performed
using the standard protocol during bolus administration of
intravenous contrast.  Multiplanar CT image reconstructions
including MIPs were obtained to evaluate the vascular anatomy.
Contrast:  80 ml Omnipaque-X66

[Series 7: pe thins · axial · 0.68mm/px · z∈[-223,+20]mm · 13 of 285 slices shown]
[im 21/285  lung]
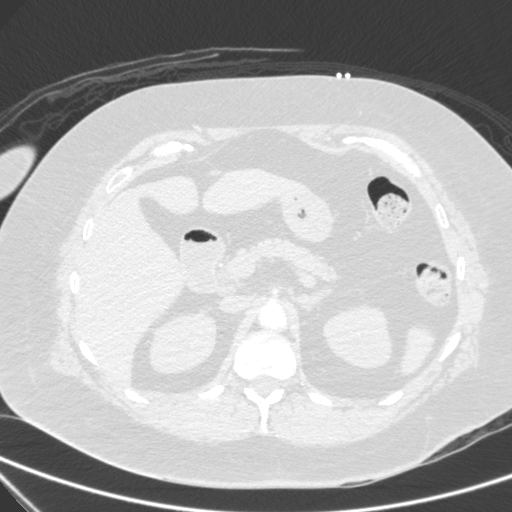
[im 41/285  mediastinal]
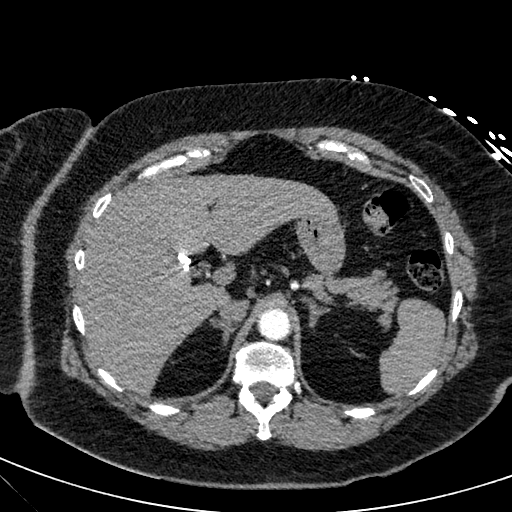
[im 61/285  lung]
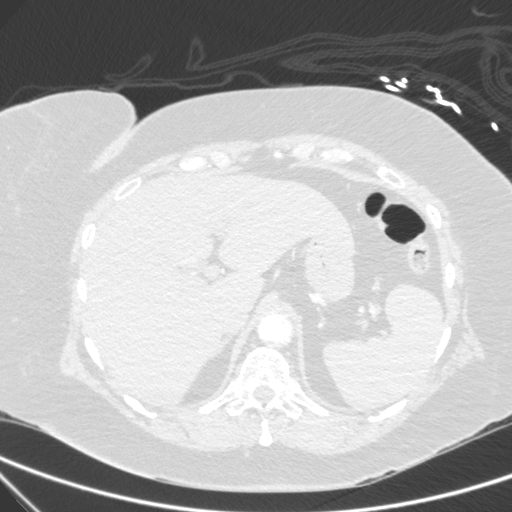
[im 82/285  mediastinal]
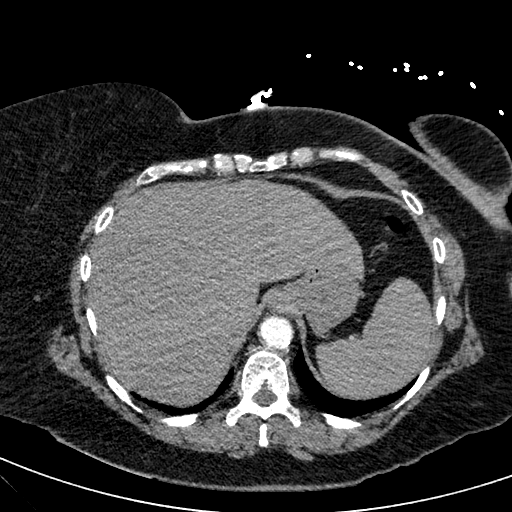
[im 102/285  lung]
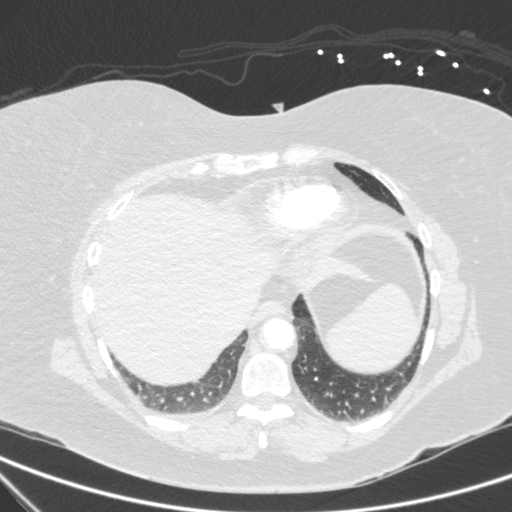
[im 122/285  mediastinal]
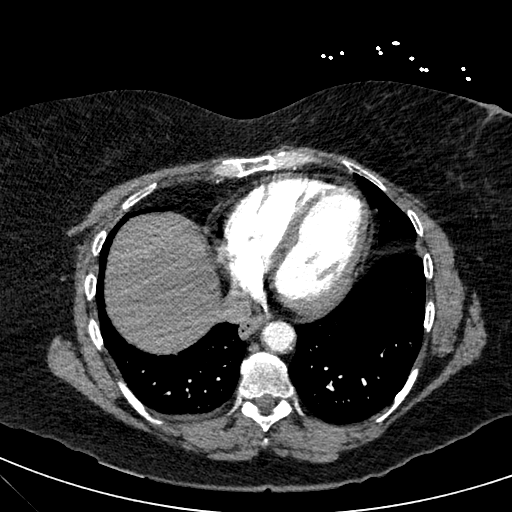
[im 143/285  lung]
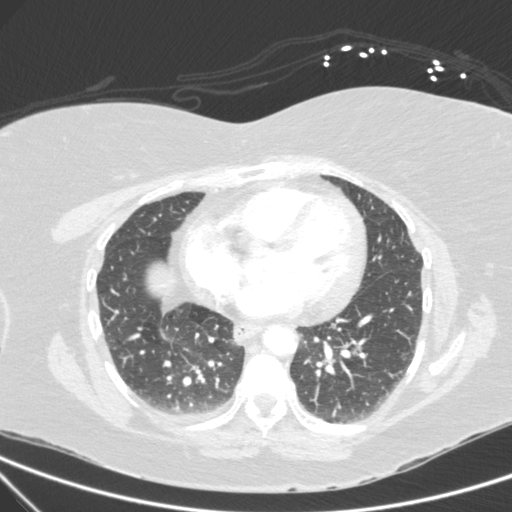
[im 163/285  mediastinal]
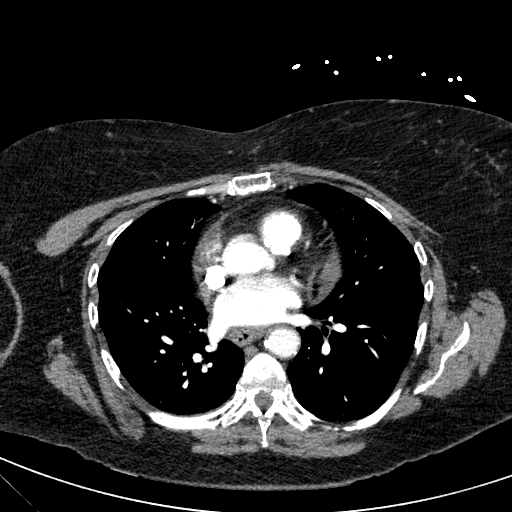
[im 183/285  lung]
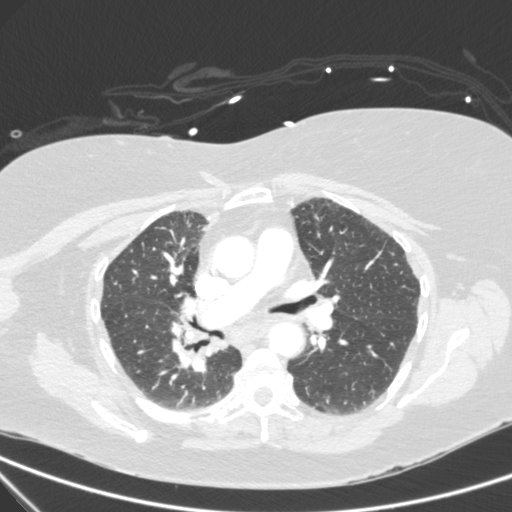
[im 203/285  mediastinal]
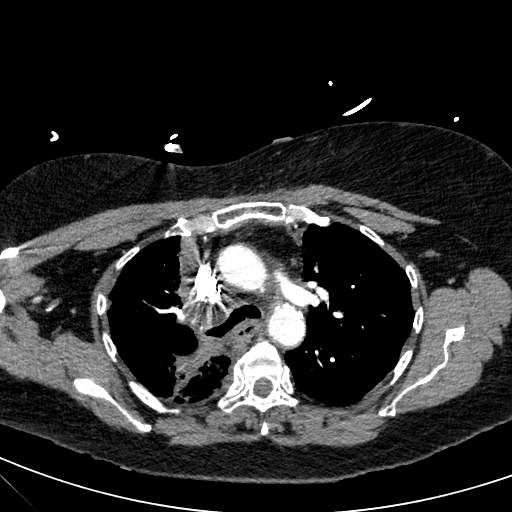
[im 224/285  lung]
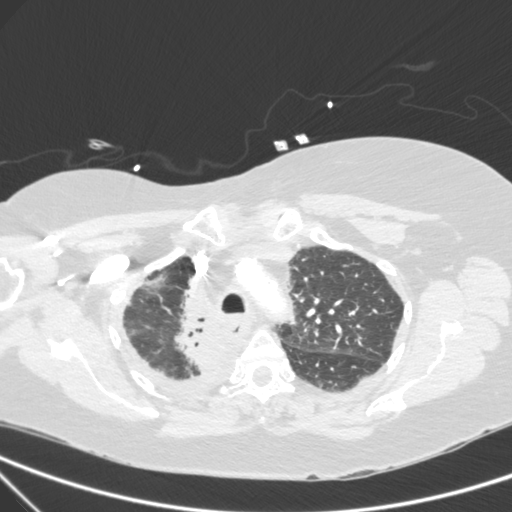
[im 244/285  mediastinal]
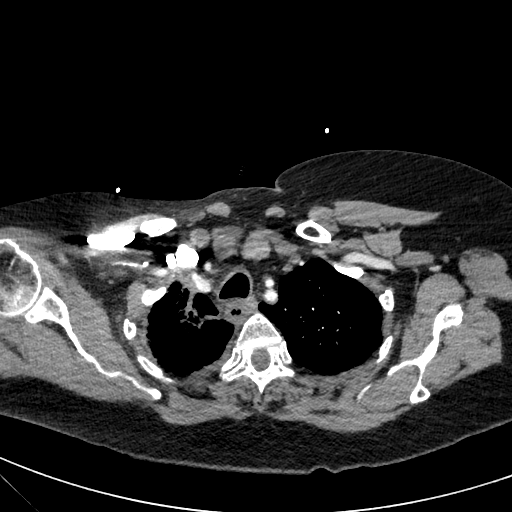
[im 264/285  lung]
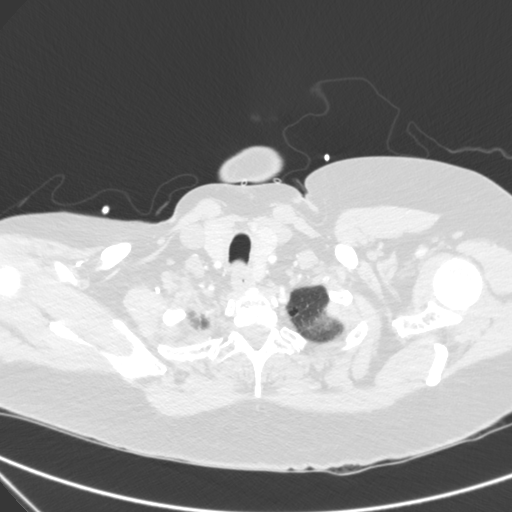

[coronals · coronal · 0.68mm/px · 1 of 142 slices shown]
[im 71/142  mediastinal]
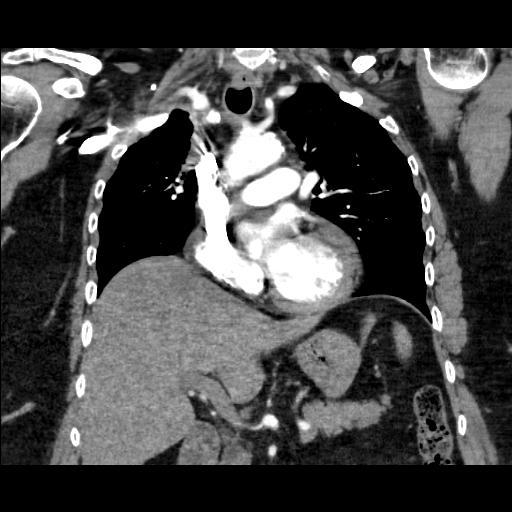

[14 of 36 positions shown; findings below may reference images not displayed]

FINDINGS: Normally opacified pulmonary arteries with no pulmonary
arterial filling defects seen.  More confluent soft tissue density
in the medial aspect of the right upper lung zone.  This has
progressed more superiorly in the apical region, containing air
bronchograms.  This has also progressed more inferiorly and
posteriorly with mildly progressive adjacent pleural thickening
posteriorly.  All of these areas containing air bronchograms with
no bronchial or vascular obstruction.  No discrete mass is
visualized.  No enlarged lymph nodes are seen.

Cholecystectomy clips are noted.  Mild thoracic spine degenerative
changes.

Review of the MIP images confirms the above findings.
IMPRESSION: 1.  No pulmonary emboli or acute abnormality.
2.  Progressive probable postradiation therapy changes in the
medial right upper chest with no discrete mass visualized.  PET CT
may be helpful in excluding recurrent malignancy.

## 2012-04-21 IMAGING — CR DG CHEST 1V PORT
1 series · 1 of 1 positions shown · non-contrast
Comparison: CT scan from 02/21/2011.  Chest x-ray from 02/07/2011.
Chest x-ray from 02/04/2011

CLINICAL DATA: Chest pain

PORTABLE CHEST - 1 VIEW

[AP]
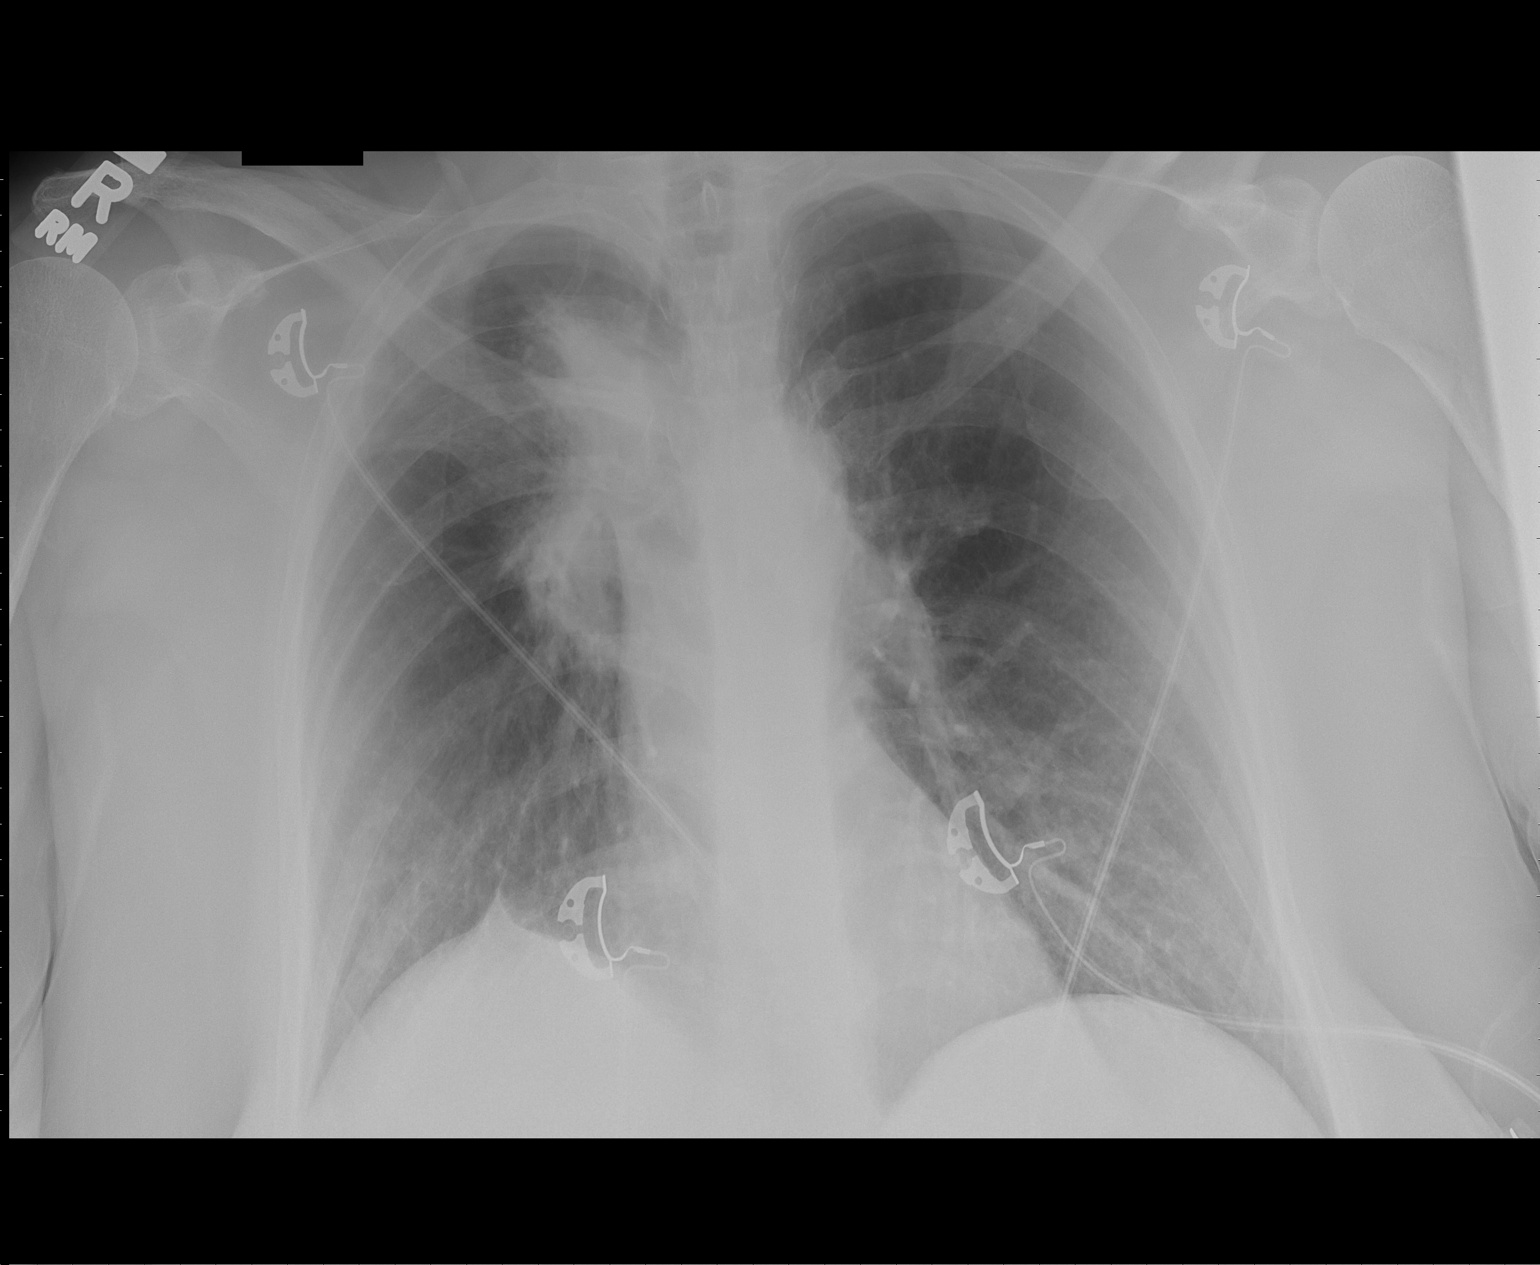

[1 of 1 positions shown; findings below may reference images not displayed]

FINDINGS: Left lung is clear.  Amorphous soft tissue in the medial
right upper lobe is compatible with post treatment change, but has
become more dense inferiorly.  There is volume loss associated in
the right hemithorax. The cardiopericardial silhouette is within
normal limits for size. Telemetry leads overlie the chest.
IMPRESSION: Post-treatment changes in the medial right upper lung appeared have
progressed along the inferior margin.  Tumor progression is a
concern.

## 2012-04-30 ENCOUNTER — Other Ambulatory Visit: Payer: Self-pay | Admitting: Internal Medicine

## 2012-05-20 IMAGING — CR DG SHOULDER 2+V*R*
3 series · 3 of 3 positions shown · non-contrast
Comparison: None.

***ADDENDUM*** CREATED: 07/19/2011 [DATE]

Comparison of the right shoulder series was made to prior chest CT
from 06/19/2011.  The density in the right upper lobe is
essentially stable since the prior chest CT which presumably
represents postradiation change. Recommend follow up as mentioned
on prior chest CT report.
***END ADDENDUM*** SIGNED BY: Kaki Jim, M.D.
CLINICAL DATA: Right shoulder pain.
RIGHT SHOULDER - 2+ VIEW

[view not recorded (1 of 3)]
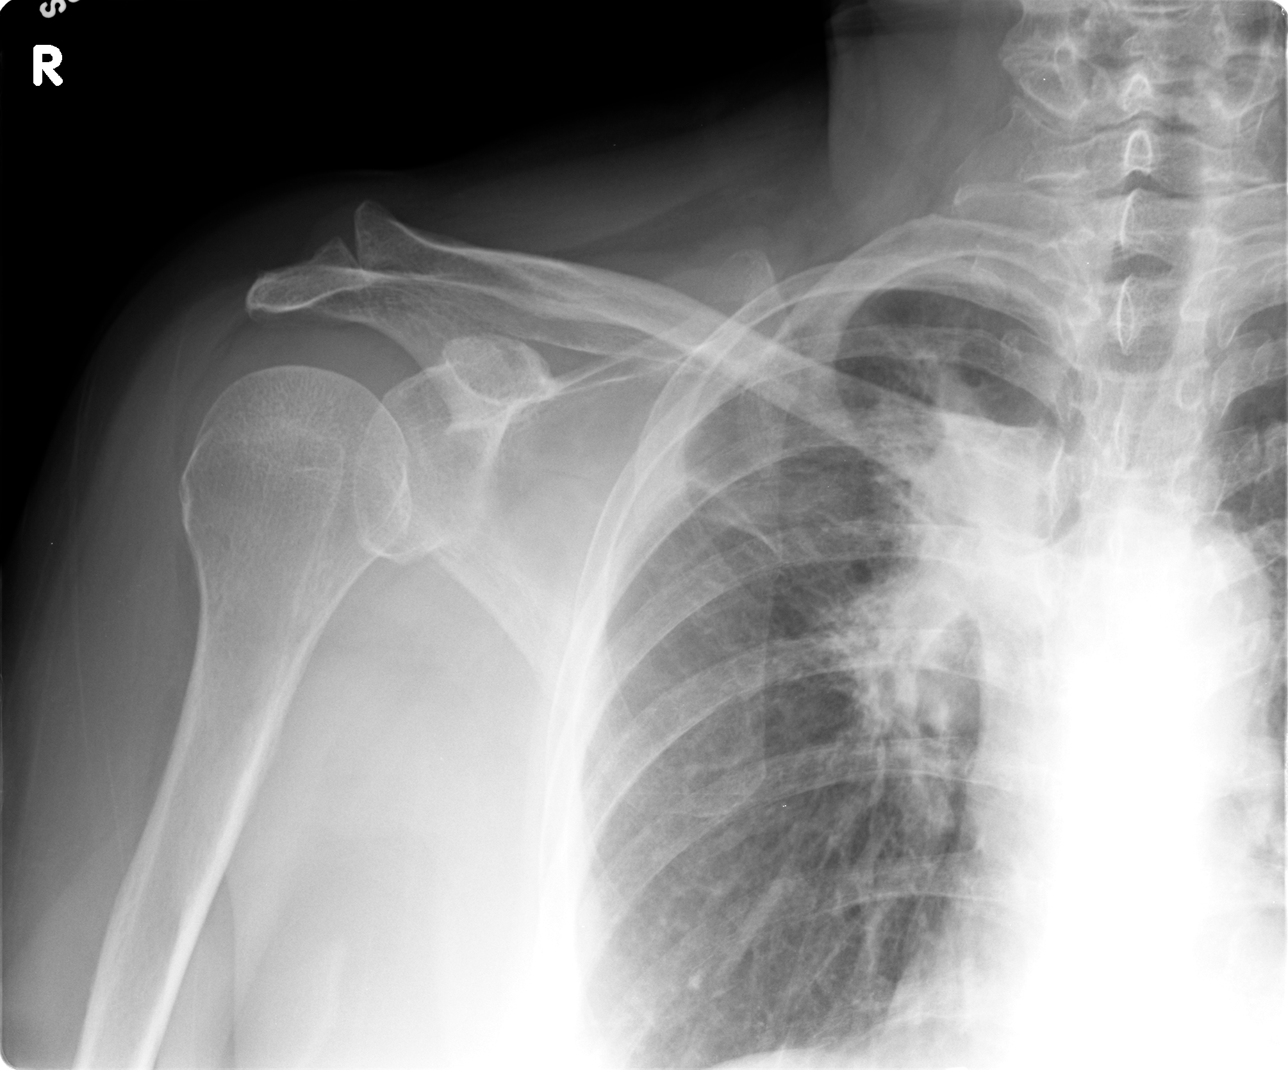

[view not recorded (2 of 3)]
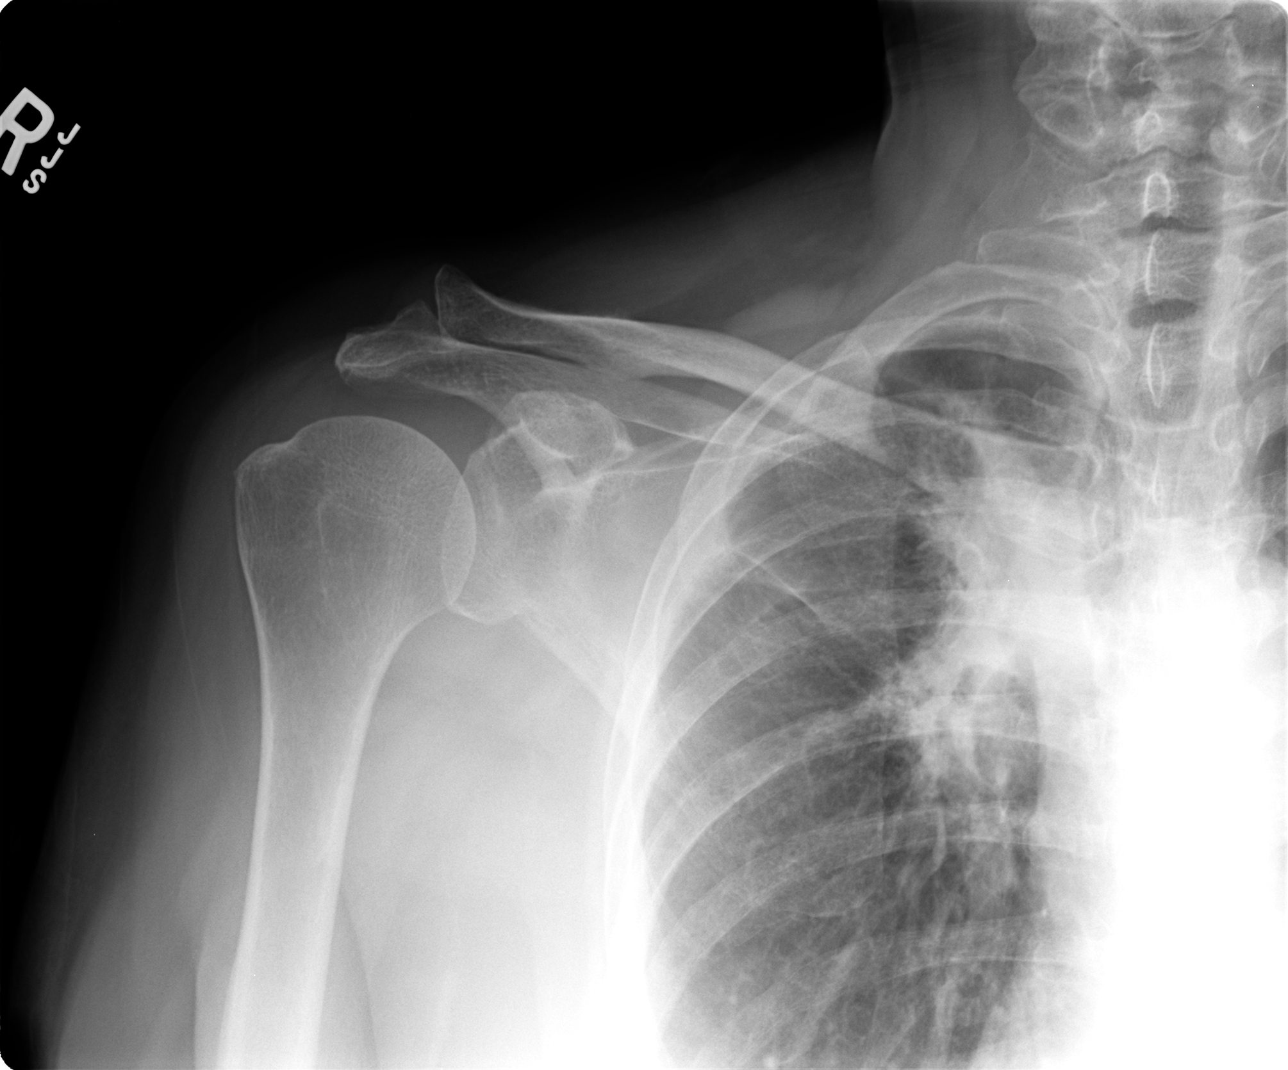

[view not recorded (3 of 3)]
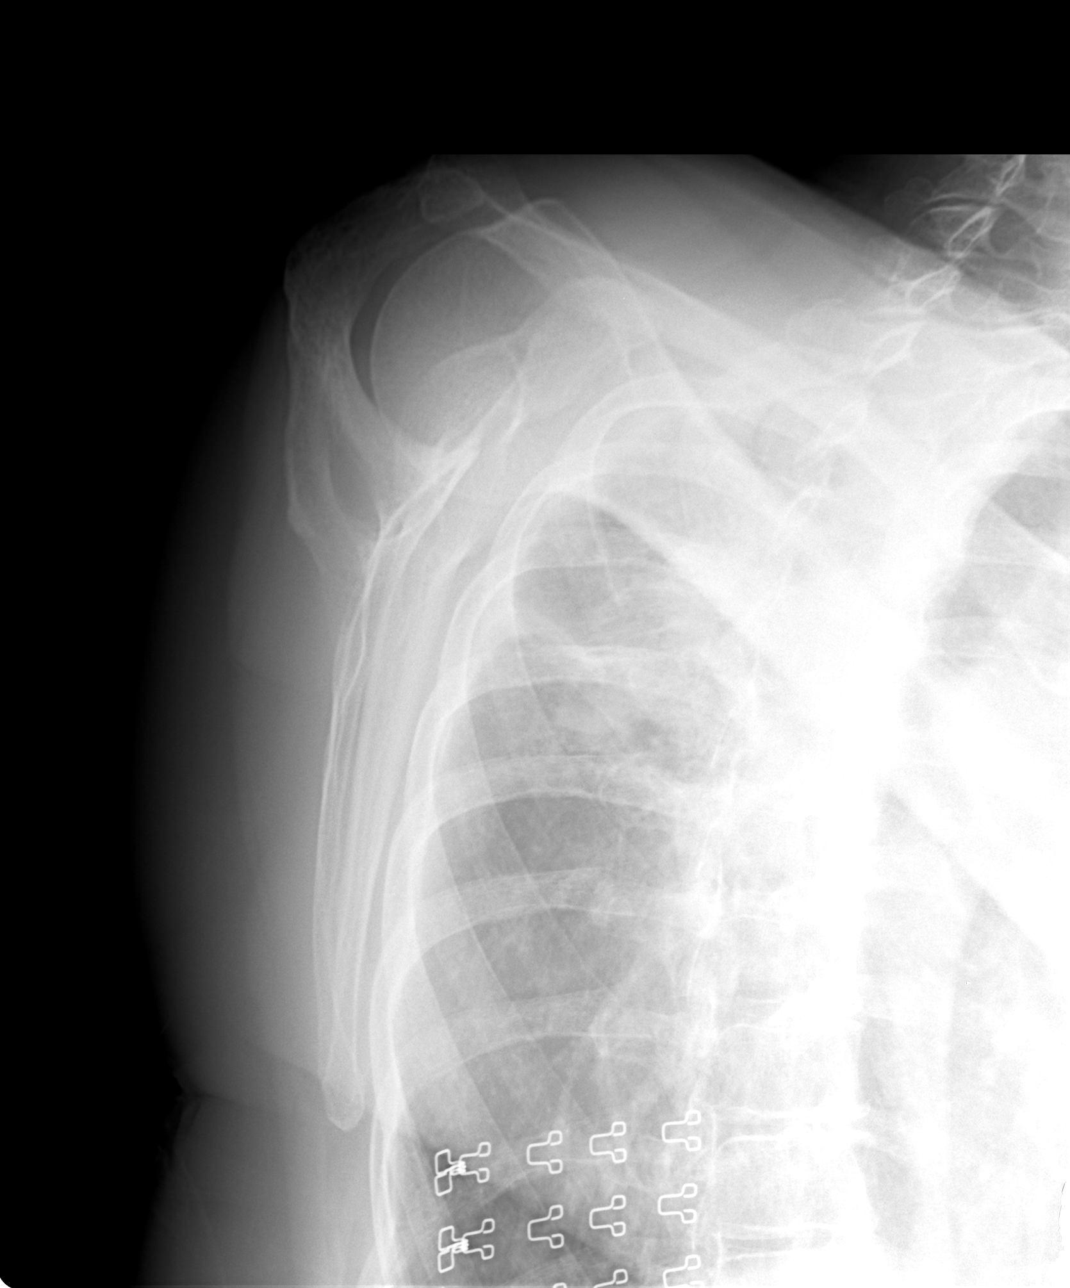

[3 of 3 positions shown; findings below may reference images not displayed]

FINDINGS: Early degenerative changes in the right AC joint.  No
inferior spurring.  Glenohumeral joint is unremarkable. No acute
bony abnormality.  Specifically, no fracture, subluxation, or
dislocation.  Soft tissues are intact.

Abnormal opacity noted in the right medial upper lung.  While this
could reflect pneumonia, I cannot exclude a central mass and
postobstructive process.
IMPRESSION: Abnormal opacity in the medial right upper lobe.  Recommend chest
CT with IV contrast for further evaluation.

Early degenerative changes in the right AC joint.

## 2012-05-26 ENCOUNTER — Encounter (HOSPITAL_COMMUNITY): Payer: Self-pay | Admitting: *Deleted

## 2012-05-26 ENCOUNTER — Emergency Department (HOSPITAL_COMMUNITY): Payer: Medicare Other

## 2012-05-26 DIAGNOSIS — C3490 Malignant neoplasm of unspecified part of unspecified bronchus or lung: Secondary | ICD-10-CM | POA: Insufficient documentation

## 2012-05-26 DIAGNOSIS — R1110 Vomiting, unspecified: Secondary | ICD-10-CM | POA: Insufficient documentation

## 2012-05-26 DIAGNOSIS — E785 Hyperlipidemia, unspecified: Secondary | ICD-10-CM | POA: Insufficient documentation

## 2012-05-26 DIAGNOSIS — J449 Chronic obstructive pulmonary disease, unspecified: Secondary | ICD-10-CM | POA: Insufficient documentation

## 2012-05-26 DIAGNOSIS — R9439 Abnormal result of other cardiovascular function study: Secondary | ICD-10-CM | POA: Insufficient documentation

## 2012-05-26 DIAGNOSIS — J4489 Other specified chronic obstructive pulmonary disease: Secondary | ICD-10-CM | POA: Insufficient documentation

## 2012-05-26 DIAGNOSIS — K219 Gastro-esophageal reflux disease without esophagitis: Secondary | ICD-10-CM | POA: Insufficient documentation

## 2012-05-26 DIAGNOSIS — I2510 Atherosclerotic heart disease of native coronary artery without angina pectoris: Secondary | ICD-10-CM | POA: Insufficient documentation

## 2012-05-26 DIAGNOSIS — IMO0001 Reserved for inherently not codable concepts without codable children: Secondary | ICD-10-CM | POA: Insufficient documentation

## 2012-05-26 DIAGNOSIS — R0789 Other chest pain: Principal | ICD-10-CM | POA: Insufficient documentation

## 2012-05-26 LAB — CBC
HCT: 39 % (ref 36.0–46.0)
Hemoglobin: 13.4 g/dL (ref 12.0–15.0)
MCH: 30.9 pg (ref 26.0–34.0)
MCV: 89.9 fL (ref 78.0–100.0)
Platelets: 166 10*3/uL (ref 150–400)
RBC: 4.34 MIL/uL (ref 3.87–5.11)
WBC: 6.5 10*3/uL (ref 4.0–10.5)

## 2012-05-26 NOTE — ED Notes (Signed)
Pt states that she was having left arm numbness around 22:30. Pt states that she took 3 SL nitro for the numbness and the numbness continued to decrease. Pt states on ride over her left chest started hurting.

## 2012-05-27 ENCOUNTER — Encounter (HOSPITAL_COMMUNITY): Payer: Self-pay | Admitting: *Deleted

## 2012-05-27 ENCOUNTER — Observation Stay (HOSPITAL_COMMUNITY)
Admission: EM | Admit: 2012-05-27 | Discharge: 2012-05-31 | DRG: 287 | Disposition: A | Discharge status: Home / Self Care | Payer: Medicare Other | Attending: Internal Medicine | Admitting: Internal Medicine

## 2012-05-27 DIAGNOSIS — E118 Type 2 diabetes mellitus with unspecified complications: Secondary | ICD-10-CM

## 2012-05-27 DIAGNOSIS — C3490 Malignant neoplasm of unspecified part of unspecified bronchus or lung: Secondary | ICD-10-CM

## 2012-05-27 DIAGNOSIS — J449 Chronic obstructive pulmonary disease, unspecified: Secondary | ICD-10-CM

## 2012-05-27 DIAGNOSIS — K219 Gastro-esophageal reflux disease without esophagitis: Secondary | ICD-10-CM

## 2012-05-27 DIAGNOSIS — Z87891 Personal history of nicotine dependence: Secondary | ICD-10-CM

## 2012-05-27 DIAGNOSIS — E782 Mixed hyperlipidemia: Secondary | ICD-10-CM | POA: Diagnosis present

## 2012-05-27 DIAGNOSIS — E1165 Type 2 diabetes mellitus with hyperglycemia: Secondary | ICD-10-CM | POA: Diagnosis present

## 2012-05-27 DIAGNOSIS — R1111 Vomiting without nausea: Secondary | ICD-10-CM

## 2012-05-27 DIAGNOSIS — E119 Type 2 diabetes mellitus without complications: Secondary | ICD-10-CM

## 2012-05-27 DIAGNOSIS — R079 Chest pain, unspecified: Secondary | ICD-10-CM

## 2012-05-27 DIAGNOSIS — R0789 Other chest pain: Secondary | ICD-10-CM

## 2012-05-27 DIAGNOSIS — E785 Hyperlipidemia, unspecified: Secondary | ICD-10-CM

## 2012-05-27 DIAGNOSIS — IMO0002 Reserved for concepts with insufficient information to code with codable children: Secondary | ICD-10-CM

## 2012-05-27 LAB — CARDIAC PANEL(CRET KIN+CKTOT+MB+TROPI)
CK, MB: 2.7 ng/mL (ref 0.3–4.0)
Relative Index: 2.3 (ref 0.0–2.5)
Relative Index: 2.4 (ref 0.0–2.5)
Total CK: 122 U/L (ref 7–177)
Total CK: 118 U/L (ref 7–177)
Total CK: 108 U/L (ref 7–177)

## 2012-05-27 LAB — BASIC METABOLIC PANEL
BUN: 11 mg/dL (ref 6–23)
CO2: 26 mEq/L (ref 19–32)
CO2: 26 mEq/L (ref 19–32)
Calcium: 9 mg/dL (ref 8.4–10.5)
Calcium: 9.4 mg/dL (ref 8.4–10.5)
Chloride: 103 mEq/L (ref 96–112)
Creatinine, Ser: 0.67 mg/dL (ref 0.50–1.10)
Glucose, Bld: 94 mg/dL (ref 70–99)
Glucose, Bld: 96 mg/dL (ref 70–99)
Sodium: 139 mEq/L (ref 135–145)

## 2012-05-27 LAB — CBC
MCH: 30.4 pg (ref 26.0–34.0)
MCV: 89.6 fL (ref 78.0–100.0)
Platelets: 149 10*3/uL — ABNORMAL LOW (ref 150–400)
RBC: 4.14 MIL/uL (ref 3.87–5.11)

## 2012-05-27 LAB — POCT I-STAT TROPONIN I: Troponin i, poc: 0 ng/mL (ref 0.00–0.08)

## 2012-05-27 MED ORDER — NITROGLYCERIN 2 % TD OINT
1.0000 [in_us] | TOPICAL_OINTMENT | Freq: Once | TRANSDERMAL | Status: AC
  Administered 2012-05-27: 1 [in_us] via TOPICAL
  Filled 2012-05-27: qty 1

## 2012-05-27 MED ORDER — OXYCODONE HCL 5 MG PO TABS
5.0000 mg | ORAL_TABLET | ORAL | Status: DC | PRN
  Administered 2012-05-27 – 2012-05-28 (×3): 5 mg via ORAL
  Filled 2012-05-27 (×4): qty 1

## 2012-05-27 MED ORDER — HEPARIN (PORCINE) IN NACL 100-0.45 UNIT/ML-% IJ SOLN
1150.0000 [IU]/h | INTRAMUSCULAR | Status: DC
  Administered 2012-05-27 – 2012-05-28 (×2): 1000 [IU]/h via INTRAVENOUS
  Filled 2012-05-27 (×3): qty 250

## 2012-05-27 MED ORDER — ONDANSETRON HCL 4 MG PO TABS: 4.0000 mg | ORAL_TABLET | Freq: Four times a day (QID) | ORAL | Status: DC | PRN

## 2012-05-27 MED ORDER — ONDANSETRON HCL 4 MG/2ML IJ SOLN: 4.0000 mg | Freq: Three times a day (TID) | INTRAMUSCULAR | Status: DC | PRN

## 2012-05-27 MED ORDER — HYDROMORPHONE HCL PF 1 MG/ML IJ SOLN: 0.5000 mg | INTRAMUSCULAR | Status: DC | PRN

## 2012-05-27 MED ORDER — NITROGLYCERIN 0.4 MG SL SUBL
0.4000 mg | SUBLINGUAL_TABLET | SUBLINGUAL | Status: DC | PRN
  Administered 2012-05-27 (×3): 0.4 mg via SUBLINGUAL

## 2012-05-27 MED ORDER — ONDANSETRON HCL 4 MG/2ML IJ SOLN
4.0000 mg | Freq: Four times a day (QID) | INTRAMUSCULAR | Status: DC | PRN
  Administered 2012-05-28 – 2012-05-29 (×2): 4 mg via INTRAVENOUS
  Filled 2012-05-27: qty 2

## 2012-05-27 MED ORDER — ACETAMINOPHEN 325 MG PO TABS
650.0000 mg | ORAL_TABLET | Freq: Once | ORAL | Status: AC
  Administered 2012-05-27: 650 mg via ORAL
  Filled 2012-05-27: qty 2

## 2012-05-27 MED ORDER — SODIUM CHLORIDE 0.9 % IV SOLN
INTRAVENOUS | Status: DC
  Administered 2012-05-27: 06:00:00 via INTRAVENOUS
  Administered 2012-05-27: 250 mL via INTRAVENOUS
  Administered 2012-05-28: 01:00:00 via INTRAVENOUS

## 2012-05-27 MED ORDER — ZOLPIDEM TARTRATE 5 MG PO TABS
5.0000 mg | ORAL_TABLET | Freq: Every evening | ORAL | Status: DC | PRN
  Administered 2012-05-27 – 2012-05-30 (×2): 5 mg via ORAL
  Filled 2012-05-27 (×2): qty 1

## 2012-05-27 MED ORDER — ACETAMINOPHEN 650 MG RE SUPP: 650.0000 mg | Freq: Four times a day (QID) | RECTAL | Status: DC | PRN

## 2012-05-27 MED ORDER — HEPARIN BOLUS VIA INFUSION
4000.0000 [IU] | Freq: Once | INTRAVENOUS | Status: AC
  Administered 2012-05-27: 4000 [IU] via INTRAVENOUS

## 2012-05-27 MED ORDER — ALUM & MAG HYDROXIDE-SIMETH 200-200-20 MG/5ML PO SUSP: 30.0000 mL | Freq: Four times a day (QID) | ORAL | Status: DC | PRN

## 2012-05-27 MED ORDER — ACETAMINOPHEN 325 MG PO TABS
650.0000 mg | ORAL_TABLET | Freq: Four times a day (QID) | ORAL | Status: DC | PRN
  Administered 2012-05-29: 650 mg via ORAL
  Filled 2012-05-27: qty 2

## 2012-05-27 MED ORDER — NITROGLYCERIN 2 % TD OINT
0.5000 [in_us] | TOPICAL_OINTMENT | Freq: Four times a day (QID) | TRANSDERMAL | Status: DC
  Administered 2012-05-27 – 2012-05-28 (×4): 0.5 [in_us] via TOPICAL
  Filled 2012-05-27: qty 30

## 2012-05-27 MED ORDER — ASPIRIN 81 MG PO CHEW
324.0000 mg | CHEWABLE_TABLET | Freq: Once | ORAL | Status: AC
  Administered 2012-05-27: 324 mg via ORAL
  Filled 2012-05-27: qty 4

## 2012-05-27 NOTE — ED Provider Notes (Signed)
History     CSN: [id number]  Arrival date & time 05/26/12  2327   First MD Initiated Contact with Patient 05/27/12 0130      Chief Complaint  Patient presents with  . Chest Pain    (Consider location/radiation/quality/duration/timing/severity/associated sxs/prior treatment) The history is provided by the patient.   66 year old female had onset at 2230 of numbness in her left arm. She took nitroglycerin and numbness went away but then recurred. She took another nitroglycerin and the numbness went away again and recurred again. She took a third nitroglycerin and the process repeated. The numbness has since recurred and is still present. She decided to come to the hospital and on the way to the hospital, started having what she describes as a matching pain in the left side of her chest. Pain was moderate and she rated it at 6/10, and the pain is completely gone at this time. Eyes dyspnea, nausea, diaphoresis. She states this usually takes 3 nitroglycerin for her chest pain rather than arm numbness. Past Medical History  Diagnosis Date  . Diabetes mellitus   . Hyperlipidemia   . GERD (gastroesophageal reflux disease)   . Lung cancer     non-small cell lung ca, stage III in 05/2011, s/p chemo/radiation  . COPD (chronic obstructive pulmonary disease)   . History of diverticulitis of colon   . CAD (coronary artery disease)     NON obstructive. Cath 2006 preserved LV fxn, scattered irregularities without critical stenosis. 2008 stress echo negative for ischemia, but with hypertensive response    Past Surgical History  Procedure Date  . Appendectomy   . Abdominal hysterectomy 1997  . Carpal tunnel release     Family History  Problem Relation Age of Onset  . Heart failure Mother   . Kidney disease Mother     renal failure  . Diabetes Mother   . Diabetes Father   . Diabetes Brother   . Heart disease Brother     History  Substance Use Topics  . Smoking status: Former Smoker --  1.0 packs/day for 45 years    Types: Cigarettes    Quit date: 10/24/2008  . Smokeless tobacco: Not on file  . Alcohol Use: No    OB History    Grav Para Term Preterm Abortions TAB SAB Ect Mult Living                  Review of Systems  All other systems reviewed and are negative.    Allergies  Lactose intolerance (gi) and Ibuprofen  Home Medications   Current Outpatient Rx  Name Route Sig Dispense Refill  . ALBUTEROL SULFATE HFA 108 (90 BASE) MCG/ACT IN AERS Inhalation Inhale 2 puffs into the lungs every 6 (six) hours as needed for wheezing or shortness of breath. For shortness of breath 1 Inhaler 0  . ASPIRIN 81 MG PO TABS Oral Take 81 mg by mouth daily.    . INSULIN ISOPHANE & REGULAR (70-30) 100 UNIT/ML [place] SUSP  25 units in a.m. And 22 units in the evening. Take with meals 60 mL 12  . LORATADINE 10 MG PO TABS Oral Take 10 mg by mouth daily.      Marland Kitchen NITROGLYCERIN 0.4 MG SL SUBL Sublingual Place 0.4 mg under the tongue every 5 (five) minutes as needed. For chest pain    . SIMVASTATIN 10 MG PO TABS Oral Take 10 mg by mouth at bedtime.    Marland Kitchen TIOTROPIUM BROMIDE MONOHYDRATE 18 MCG IN  CAPS Inhalation Place 18 mcg into inhaler and inhale daily as needed. For shortness of breath      BP 107/65  Pulse 97  Temp 97.7 F (36.5 C) (Oral)  Resp 16  SpO2 95%  Physical Exam  Nursing note and vitals reviewed.  66 year old female is resting comfortably and in no acute distress. Vital signs are normal. Oxygen saturation is 95% which is normal. Head is, cephalic and atraumatic. PERRLA, EOMI. Neck is nontender supple without adenopathy, JVD, or bruit. Lungs are clear without rales, wheezes, or rhonchi. Heart has regular rate rhythm without murmur. Abdomen is soft, flat, nontender without masses or hepatosplenomegaly. Extremities have no cyanosis or edema, full range of motion is present. Skin is warm and dry without rash. Neurologic: Mental status is normal, cranial nerves are intact, there no  motor or sensory deficits.  ED Course  Procedures (including critical care time)  Results for orders placed during the hospital encounter of 05/27/12  CBC      Component Value Range   WBC 6.5  4.0 - 10.5 K/uL   RBC 4.34  3.87 - 5.11 MIL/uL   Hemoglobin 13.4  12.0 - 15.0 g/dL   HCT 16.1  09.6 - 04.5 %   MCV 89.9  78.0 - 100.0 fL   MCH 30.9  26.0 - 34.0 pg   MCHC 34.4  30.0 - 36.0 g/dL   RDW 40.9  81.1 - 91.4 %   Platelets 166  150 - 400 K/uL  BASIC METABOLIC PANEL      Component Value Range   Sodium 140  135 - 145 mEq/L   Potassium 3.8  3.5 - 5.1 mEq/L   Chloride 103  96 - 112 mEq/L   CO2 26  19 - 32 mEq/L   Glucose, Bld 96  70 - 99 mg/dL   BUN 11  6 - 23 mg/dL   Creatinine, Ser 7.82  0.50 - 1.10 mg/dL   Calcium 9.4  8.4 - 95.6 mg/dL   GFR calc non Af Amer >90  >90 mL/min   GFR calc Af Amer >90  >90 mL/min  POCT I-STAT TROPONIN I      Component Value Range   Troponin i, poc 0.00  0.00 - 0.08 ng/mL   Comment 3            Dg Chest 2 View  05/27/2012  *RADIOLOGY REPORT*  Clinical Data: Sudden onset numbness and tingling in the left arm and hand with chest pain.  CHEST - 2 VIEW  Comparison: Chest 01/21/2012.  CT chest 02/16/2012.  Findings: Volume loss, scarring, and architectural distortion in the right upper lung suggesting resection and / or radiation change.  Findings appear stable since the previous study.  No evidence of developing density or mass.  Normal heart size and pulmonary vascularity.  No focal airspace consolidation.  No blunting of costophrenic angles.  No pneumothorax.  IMPRESSION: Volume loss, scarring, and architectural distortion in the right upper lung likely representing postoperative/post radiation change. No significant change since previous study.  No active infiltration.  Original Report Authenticated By: Marlon Pel, M.D.     Date: 05/27/2012  Rate: 99  Rhythm: normal sinus rhythm  QRS Axis: normal  Intervals: QT prolonged  ST/T Wave  abnormalities: normal  Conduction Disutrbances:none  Narrative Interpretation: Long QT interval, old anteroseptal myocardial infarction. Compared with ECG of 06/19/2011, no significant changes are seen.  Old EKG Reviewed: unchanged    1. Chest pain  MDM  Recurrent left arm numbness which were his has responded to nitroglycerin. This is worrisome for unstable angina. She will be given topical nitrates and started on heparin. Prior records are reviewed and she was admitted to the hospital one year ago for chest pain and at that time she did have a negative dobutamine stress test.  Workup is negative for acute cardiac injury, but because of the history which is suggestive of unstable angina, she will be admitted. Case is been discussed with Dr. Lovell Sheehan of [facility] who agrees to admit the patient.     Dione Booze, MD 05/27/12 [phone]

## 2012-05-27 NOTE — H&P (Addendum)
[facility] History and Physical  [name] ZOX:[id number] DOB: Jan 16, 1946 DOA: 05/27/2012  Referring physician: EDP PCP: [name], MD   Chief Complaint: Left Arm Numbness  HPI: [name] is an 66 y.o. Female who presented to the ED with complaint of severe heaviness and numbeness in her left Arm that started at 10:30 PM.  She took a sublingual NTG tab and had relief of the pain, but the pain returned and she took another , and the pain resolved again, but returned again and she took a third NTG tab which temporarily resolved the pain but it again retyurned so she went to the ED.  She denied having any obvious chest pain , and she denied having SOB, Nausea or vomiting or diaphoresis.     Review of Systems: The patient denies anorexia, fever, weight loss, vision loss, decreased hearing, hoarseness, syncope, dyspnea on exertion, peripheral edema, balance deficits, hemoptysis, abdominal pain, melena, hematochezia, severe indigestion/heartburn, hematuria, incontinence, genital sores, muscle weakness, suspicious skin lesions, transient blindness, difficulty walking, depression, unusual weight change, abnormal bleeding, enlarged lymph nodes, angioedema, and breast masses.    Past Medical History  Diagnosis Date  . Diabetes mellitus   . Hyperlipidemia   . GERD (gastroesophageal reflux disease)   . Lung cancer     non-small cell lung ca, stage III in 05/2011, s/p chemo/radiation  . COPD (chronic obstructive pulmonary disease)   . History of diverticulitis of colon   . CAD (coronary artery disease)     NON obstructive. Cath 2006 preserved LV fxn, scattered irregularities without critical stenosis. 2008 stress echo negative for ischemia, but with hypertensive response   Past Surgical History  Procedure Date  . Appendectomy   . Abdominal hysterectomy 1997  . Carpal tunnel release     Prior to Admission medications   Medication Sig Start Date End Date Taking?  Authorizing Provider  albuterol (PROVENTIL HFA;VENTOLIN HFA) 108 (90 BASE) MCG/ACT inhaler Inhale 2 puffs into the lungs every 6 (six) hours as needed for wheezing or shortness of breath. For shortness of breath 01/22/12  Yes Shanker Levora Dredge, MD  aspirin 81 MG tablet Take 81 mg by mouth daily.   Yes Historical Provider, MD  insulin NPH-insulin regular (NOVOLIN 70/30) (70-30) 100 UNIT/ML injection 25 units in a.m. And 22 units in the evening. Take with meals 01/24/12  Yes Bruce Romilda Garret, MD  loratadine (CLARITIN) 10 MG tablet Take 10 mg by mouth daily.     Yes Historical Provider, MD  nitroGLYCERIN (NITROSTAT) 0.4 MG SL tablet Place 0.4 mg under the tongue every 5 (five) minutes as needed. For chest pain   Yes Historical Provider, MD  simvastatin (ZOCOR) 10 MG tablet Take 10 mg by mouth at bedtime.   Yes Historical Provider, MD  tiotropium (SPIRIVA) 18 MCG inhalation capsule Place 18 mcg into inhaler and inhale daily as needed. For shortness of breath 01/22/12 01/21/13  Maretta Bees, MD   Allergies  Allergen Reactions  . Lactose Intolerance (Gi) Diarrhea  . Ibuprofen     REACTION: Anxiousness, hypoventilate     Social History:  reports that she quit smoking about 3 years ago. Her smoking use included Cigarettes. She has a 45 pack-year smoking history. She does not have any smokeless tobacco history on file. She reports that she does not drink alcohol or use illicit drugs.   Family History  Problem Relation Age of Onset  . Heart failure Mother   . Kidney disease Mother  renal failure  . Diabetes Mother   . Diabetes Father   . Diabetes Brother   . Heart disease Brother       Physical Exam:  GEN:  Pleasant 66 year old Well Nourished and Well developed Caucasian Female   examined  and in no acute distress; cooperative with exam   Filed Vitals:  05/27/12 0206 05/27/12 0214 05/27/12 0221 05/27/12  0224 BP:  123/64 117/68 116/69 Pulse:  91 95 92 Temp:     TempSrc:     Resp:  18   Height: 5\' 2"  (1.575 m)    Weight: 81.194 kg (179 lb)    SpO2:  100% 94% 93%  Blood pressure 116/69, pulse 92, temperature 97.7 F (36.5 C), temperature source Oral, resp. rate 18, height 5\' 2"  (1.575 m), weight 81.194 kg (179 lb), SpO2 93.00%. PSYCH: She is alert and oriented x4; does not appear anxious does not appear depressed; affect is normal HEENT: Normocephalic and Atraumatic, Mucous membranes pink; PERRLA; EOM intact; Fundi:  Benign;  No scleral icterus, Nares: Patent, Oropharynx: Clear, Fair Dentition, Neck:  FROM, no cervical lymphadenopathy nor thyromegaly or carotid bruit; no JVD; Breasts:: Not examined CHEST WALL: No tenderness CHEST: Normal respiration, clear to auscultation bilaterally HEART: Regular rate and rhythm; no murmurs rubs or gallops BACK: No kyphosis or scoliosis; no CVA tenderness ABDOMEN: Positive Bowel Sounds, Obese, soft non-tender; no masses, no organomegaly. Rectal Exam: Not done EXTREMITIES: No bone or joint deformity; age-appropriate arthropathy of the hands and knees; no cyanosis, clubbing or edema; no ulcerations. Genitalia: not examined PULSES: 2+ and symmetric SKIN: Normal hydration no rash or ulceration CNS: Cranial nerves 2-12 grossly intact no focal neurologic deficit   Labs on Admission:  Basic Metabolic Panel:  Lab 05/26/12 1610  NA 140  K 3.8  CL 103  CO2 26  GLUCOSE 96  BUN 11  CREATININE 0.67  CALCIUM 9.4  MG --  PHOS --   Liver Function Tests: No results found for this basename: AST:5,ALT:5,ALKPHOS:5,BILITOT:5,PROT:5,ALBUMIN:5 in the last 168 hours No results found for this basename: LIPASE:5,AMYLASE:5 in the last 168 hours No results found for this basename: AMMONIA:5 in the last 168 hours CBC:  Lab 05/26/12 2337  WBC 6.5  NEUTROABS --  HGB 13.4  HCT 39.0  MCV 89.9  PLT 166   Cardiac Enzymes: No results found for this basename:  CKTOTAL:5,CKMB:5,CKMBINDEX:5,TROPONINI:5 in the last 168 hours  BNP (last 3 results)  Basename 06/19/11 1724  PROBNP 45.4   CBG: No results found for this basename: GLUCAP:5 in the last 168 hours  Radiological Exams on Admission: Dg Chest 2 View  05/27/2012  *RADIOLOGY REPORT*  Clinical Data: Sudden onset numbness and tingling in the left arm and hand with chest pain.  CHEST - 2 VIEW  Comparison: Chest 01/21/2012.  CT chest 02/16/2012.  Findings: Volume loss, scarring, and architectural distortion in the right upper lung suggesting resection and / or radiation change.  Findings appear stable since the previous study.  No evidence of developing density or mass.  Normal heart size and pulmonary vascularity.  No focal airspace consolidation.  No blunting of costophrenic angles.  No pneumothorax.  IMPRESSION: Volume loss, scarring, and architectural distortion in the right upper lung likely representing postoperative/post radiation change. No significant change since previous study.  No active infiltration.  Original Report Authenticated By: Marlon Pel, M.D.    EKG: Independently reviewed. Normal sinus Rhythm No acute ST changes.    Assessment: Principal Problem:  *Atypical  chest pain Active Problems:  CARCINOMA, LUNG, SMALL CELL  DIABETES MELLITUS, TYPE II  HYPERLIPIDEMIA  COPD (chronic obstructive pulmonary disease)  GERD   Plan:   Admit to Telemetry Bed for 23 Hour Observation Cardiac Enzymes, Nitropaste, O2, No ASA due to Ibuprofen possible Cross Reactivity IV Heparin drip Reconcile Home Medications SSI coverage PRN   Code Status: FULL CODE Family Communication: Daughter at Bedside Disposition Plan: Return Home  Time spent:  60 minutes  Yashar Inclan C [facility] Pager [phone]  If 7PM-7AM, please contact night-coverage [email or web address] Password [email or web address] 05/27/2012, 2:56 AM

## 2012-05-27 NOTE — Progress Notes (Signed)
ANTICOAGULATION CONSULT NOTE - Initial Consult  Pharmacy Consult for heparin  Indication: chest pain/ACS  Allergies  Allergen Reactions  . Lactose Intolerance (Gi) Diarrhea  . Ibuprofen     REACTION: Anxiousness, hypoventilate     Patient Measurements: Height: 5\' 2"  (157.5 cm) Weight: 177 lb 8 oz (80.513 kg) IBW/kg (Calculated) : 50.1  Heparin Dosing Weight: 68 kg  Vital Signs: Temp: 97.7 F (36.5 C) (08/04 0401) Temp src: Oral (08/04 0401) BP: 117/71 mmHg (08/04 0400) Pulse Rate: 96  (08/04 0401)  Labs:  Basename 05/27/12 1157 05/27/12 0305 05/27/12 0257 05/26/12 2337  HGB -- 12.6 -- 13.4  HCT -- 37.1 -- 39.0  PLT -- 149* -- 166  APTT -- -- -- --  LABPROT -- -- -- --  INR -- -- -- --  HEPARINUNFRC 0.48 -- <0.10* --  CREATININE -- 0.71 -- 0.67  CKTOTAL 118 122 -- --  CKMB 2.7 2.3 -- --  TROPONINI <0.30 <0.30 -- --    Estimated Creatinine Clearance: 70.8 ml/min (by C-G formula based on Cr of 0.71).  Assessment: 66 y.o. on heparin for r/o ACS. Thus far enzymes negative. Heparin level therapeutic on 1000 units/hr. No bleeding noted.  No recurrent order for ASA per MD note due to ?cross reactivity with allergy to ibuprofen but tolerated 324 mg dose this a.m. and takes ASA 81mg  daily at home   Goal of Therapy:  Heparin level 0.3-0.7 units/ml Monitor platelets by anticoagulation protocol: Yes   Plan:  1) Continue heparin at 1000 units/hr.  2) F/u  daily heparin level and CBC 3) Consider ordering daily ASA    Christoper Fabian, PharmD, BCPS Clinical pharmacist, pager [phone] 05/27/2012,1:26 PM

## 2012-05-27 NOTE — ED Notes (Signed)
Pt was getting ready for bed and had acute onset left arm numbness, pain was relieved with three SL nitro. One 81 mg aspirin taken. Pt short of breath, however pt states this is normal for her due to COPD. Denies injury to arm and shoulder.

## 2012-05-27 NOTE — Progress Notes (Signed)
Ms. Beshears was admitted this morning with chest pain rule out. So far markers are all negative EKGs negative. If patient rules out then will discharge her tomorrow to followup with cardiology outpatient for stress test.

## 2012-05-27 NOTE — Progress Notes (Signed)
ANTICOAGULATION CONSULT NOTE - Initial Consult  Pharmacy Consult for heparin  Indication: chest pain/ACS  Allergies  Allergen Reactions  . Lactose Intolerance (Gi) Diarrhea  . Ibuprofen     REACTION: Anxiousness, hypoventilate     Patient Measurements: Height: 5\' 2"  (157.5 cm) Weight: 179 lb (81.194 kg) IBW/kg (Calculated) : 50.1  Heparin Dosing Weight:   Vital Signs: Temp: 97.7 F (36.5 C) (08/03 2333) Temp src: Oral (08/03 2333) BP: 116/69 mmHg (08/04 0224) Pulse Rate: 92  (08/04 0224)  Labs:  [name] 05/26/12 2337  HGB 13.4  HCT 39.0  PLT 166  APTT --  LABPROT --  INR --  HEPARINUNFRC --  CREATININE 0.67  CKTOTAL --  CKMB --  TROPONINI --    Estimated Creatinine Clearance: 71 ml/min (by C-G formula based on Cr of 0.67).   Medical History: Past Medical History  Diagnosis Date  . Diabetes mellitus   . Hyperlipidemia   . GERD (gastroesophageal reflux disease)   . Lung cancer     non-small cell lung ca, stage III in 05/2011, s/p chemo/radiation  . COPD (chronic obstructive pulmonary disease)   . History of diverticulitis of colon   . CAD (coronary artery disease)     NON obstructive. Cath 2006 preserved LV fxn, scattered irregularities without critical stenosis. 2008 stress echo negative for ischemia, but with hypertensive response    Medications: no coumadin or other anticoagulants per current home med rec   (Not in a hospital admission)  Assessment: Recurrent left arm numbness relieved by sl ntg. Heparin per acs protocol. Goal of Therapy:  Heparin level 0.3-0.7 units/ml Monitor platelets by anticoagulation protocol: Yes   Plan:  Heparin 4000 units iv x1 then 1000 units/hr. 6 hours heparin level then daily HL and CBC starting 8/5    [name] 05/27/2012,2:47 AM

## 2012-05-28 ENCOUNTER — Encounter (HOSPITAL_COMMUNITY): Payer: Self-pay | Admitting: Physician Assistant

## 2012-05-28 ENCOUNTER — Inpatient Hospital Stay (HOSPITAL_COMMUNITY): Payer: Medicare Other

## 2012-05-28 DIAGNOSIS — R079 Chest pain, unspecified: Secondary | ICD-10-CM

## 2012-05-28 LAB — BASIC METABOLIC PANEL
CO2: 25 mEq/L (ref 19–32)
Calcium: 9.3 mg/dL (ref 8.4–10.5)
Creatinine, Ser: 0.63 mg/dL (ref 0.50–1.10)
Glucose, Bld: 171 mg/dL — ABNORMAL HIGH (ref 70–99)

## 2012-05-28 LAB — CBC
Hemoglobin: 13.3 g/dL (ref 12.0–15.0)
MCH: 30 pg (ref 26.0–34.0)
MCV: 88.9 fL (ref 78.0–100.0)
RBC: 4.43 MIL/uL (ref 3.87–5.11)

## 2012-05-28 LAB — HEPATIC FUNCTION PANEL
ALT: 16 U/L (ref 0–35)
AST: 22 U/L (ref 0–37)
Albumin: 3.6 g/dL (ref 3.5–5.2)
Total Protein: 7.1 g/dL (ref 6.0–8.3)

## 2012-05-28 LAB — GLUCOSE, CAPILLARY: Glucose-Capillary: 183 mg/dL — ABNORMAL HIGH (ref 70–99)

## 2012-05-28 LAB — LIPASE, BLOOD: Lipase: 21 U/L (ref 11–59)

## 2012-05-28 MED ORDER — INSULIN GLARGINE 100 UNIT/ML ~~LOC~~ SOLN
10.0000 [IU] | Freq: Every day | SUBCUTANEOUS | Status: DC
  Administered 2012-05-28: 10 [IU] via SUBCUTANEOUS

## 2012-05-28 MED ORDER — PANTOPRAZOLE SODIUM 40 MG PO TBEC
40.0000 mg | DELAYED_RELEASE_TABLET | Freq: Every day | ORAL | Status: DC
  Administered 2012-05-28 – 2012-05-30 (×3): 40 mg via ORAL
  Filled 2012-05-28 (×3): qty 1

## 2012-05-28 MED ORDER — TIOTROPIUM BROMIDE MONOHYDRATE 18 MCG IN CAPS
18.0000 ug | ORAL_CAPSULE | Freq: Every day | RESPIRATORY_TRACT | Status: DC
  Administered 2012-05-30 – 2012-05-31 (×2): 18 ug via RESPIRATORY_TRACT
  Filled 2012-05-28: qty 5

## 2012-05-28 MED ORDER — INSULIN ASPART 100 UNIT/ML ~~LOC~~ SOLN
0.0000 [IU] | Freq: Three times a day (TID) | SUBCUTANEOUS | Status: DC
  Administered 2012-05-29 – 2012-05-30 (×3): 3 [IU] via SUBCUTANEOUS
  Administered 2012-05-30 – 2012-05-31 (×2): 2 [IU] via SUBCUTANEOUS

## 2012-05-28 MED ORDER — ASPIRIN EC 81 MG PO TBEC
81.0000 mg | DELAYED_RELEASE_TABLET | Freq: Every day | ORAL | Status: DC
  Administered 2012-05-28 – 2012-05-30 (×3): 81 mg via ORAL
  Filled 2012-05-28 (×4): qty 1

## 2012-05-28 MED ORDER — ASPIRIN 81 MG PO TABS: 81.0000 mg | ORAL_TABLET | Freq: Every day | ORAL | Status: DC

## 2012-05-28 MED ORDER — SIMVASTATIN 20 MG PO TABS
20.0000 mg | ORAL_TABLET | Freq: Every day | ORAL | Status: DC
  Administered 2012-05-28 – 2012-05-30 (×3): 20 mg via ORAL
  Filled 2012-05-28 (×5): qty 1

## 2012-05-28 MED ORDER — ALBUTEROL SULFATE HFA 108 (90 BASE) MCG/ACT IN AERS
2.0000 | INHALATION_SPRAY | Freq: Four times a day (QID) | RESPIRATORY_TRACT | Status: DC | PRN
  Filled 2012-05-28: qty 6.7

## 2012-05-28 MED ORDER — SIMVASTATIN 10 MG PO TABS
10.0000 mg | ORAL_TABLET | Freq: Every day | ORAL | Status: DC
  Filled 2012-05-28: qty 1

## 2012-05-28 MED ORDER — REGADENOSON 0.4 MG/5ML IV SOLN
0.4000 mg | Freq: Once | INTRAVENOUS | Status: AC
  Administered 2012-05-29: 0.4 mg via INTRAVENOUS
  Filled 2012-05-28: qty 5

## 2012-05-28 MED ORDER — LORATADINE 10 MG PO TABS
10.0000 mg | ORAL_TABLET | Freq: Every day | ORAL | Status: DC
  Administered 2012-05-28 – 2012-05-30 (×3): 10 mg via ORAL
  Filled 2012-05-28 (×4): qty 1

## 2012-05-28 MED ORDER — IOHEXOL 300 MG/ML  SOLN
100.0000 mL | Freq: Once | INTRAMUSCULAR | Status: AC | PRN
  Administered 2012-05-28: 100 mL via INTRAVENOUS

## 2012-05-28 MED ORDER — NITROGLYCERIN 0.4 MG SL SUBL: 0.4000 mg | SUBLINGUAL_TABLET | SUBLINGUAL | Status: DC | PRN

## 2012-05-28 NOTE — Consult Note (Addendum)
Patient ID: [name] MRN: [id number], DOB/AGE: 64-Jan-1947   Admit date: 05/27/2012 Date of Consult: @TODAY @  Primary Physician: [name], MD Primary Cardiologist: [name]    Problem List: Past Medical History  Diagnosis Date  . Diabetes mellitus   . GERD (gastroesophageal reflux disease)   . Lung cancer     non-small cell lung ca, stage III in 05/2011, s/p chemo/radiation  . COPD (chronic obstructive pulmonary disease)   . History of diverticulitis of colon   . CAD (coronary artery disease)     NON obstructive. Cath 2006 preserved LV fxn, scattered irregularities without critical stenosis. 2008 stress echo negative for ischemia, but with hypertensive response  . Hyperlipidemia   . Cancer of lung 12/24/2008    treated with radiation and chemotherapy    Past Surgical History  Procedure Date  . Appendectomy   . Abdominal hysterectomy 1997  . Carpal tunnel release      Allergies:  Allergies  Allergen Reactions  . Lactose Intolerance (Gi) Diarrhea  . Ibuprofen     REACTION: Anxiousness, hypoventilate     HPI: Patient is a 66 yr old who we are asked to see re CP The patient has a history of nonobstructive CAD by cath in 2006.  She was admtted in august 2012 with CP>  She underwent dobutamine echo which showed no evidence of ischemia. She was last seen in clinic in 2008.    Pt left arm pain began Saturday 8/3, pm. Pt was not exerting herself. Pt pain was associated with numbness in her left arm. Family gave her SL NTG x 3, pain decreased the pain from 10/10 to a 0/10, but it started again in the ER. Pt was SOB, but no N&V or diaphoresis. Pt had NTG paste and feels this helped the pain. The arm pain eventually resolved.    Pt had SSCP today after an episode of N&V.  Nursing reports signif volume of emesis in sink with chunks of food.   She describes her previous chest pain as needle-like symptoms  This however was a squeezing and a pressure. It is still going on at a  1/10.  (8/10 at its worst)  Currently not nauseated. Denies food getting stuck when eating.  Had diarrhea on 8/3 after shopping.  Inpatient Medications:    . aspirin EC  81 mg Oral Daily  . insulin aspart  0-15 Units Subcutaneous TID WC  . insulin glargine  10 Units Subcutaneous QHS  . loratadine  10 mg Oral Daily  . pantoprazole  40 mg Oral Q1200  . simvastatin  10 mg Oral QHS  . tiotropium  18 mcg Inhalation Daily  . DISCONTD: aspirin  81 mg Oral Daily  . DISCONTD: nitroGLYCERIN  0.5 inch Topical Q6H    Family History  Problem Relation Age of Onset  . Heart failure Mother   . Kidney disease Mother     renal failure  . Diabetes Mother   . Diabetes Father   . Diabetes Brother   . Heart disease Brother    Family Status  Relation Status Death Age  . Mother Deceased 53  . Father Deceased   . Sister Alive     healthy  . Brother Alive   . Brother Deceased     3 brother from MI   History   Social History  . Marital Status: Married    Spouse Name: N/A    Number of Children: N/A  . Years of Education: N/A  Occupational History  . Retired Engineer, petroleum   Social History Main Topics  . Smoking status: Former Smoker -- 1.0 packs/day for 45 years    Types: Cigarettes    Quit date: 10/24/2008  . Smokeless tobacco: Not on file  . Alcohol Use: No  . Drug Use: No  . Sexually Active: Not on file   Social History Narrative   Lives in [place], has husband and daughter and son. Daughter's family is living with her and husband at present. She is ambulatory daily without cane or walker.      Review of Systems: General: negative for chills, fever, night sweats or weight changes.  Cardiovascular: Positive for chest pain, dyspnea on exertion, Negative for edema, orthopnea, palpitations, paroxysmal nocturnal dyspnea  Dermatological: negative for rash Respiratory: Positive for cough or wheezing Urologic: negative for hematuria Abdominal: Positive for nausea, vomiting,  diarrhea, but no bright red blood per rectum, melena, or hematemesis Neurologic: negative for visual changes, syncope, or dizziness All other systems reviewed and are otherwise negative except as noted above.  Physical Exam: Filed Vitals:   05/28/12 1400  BP: 117/73  Pulse: 75  Temp: 97.7 F (36.5 C)  Resp: 18    Intake/Output Summary (Last 24 hours) at 05/28/12 1717 Last data filed at 05/28/12 1406  Gross per 24 hour  Intake 2248.75 ml  Output   4303 ml  Net -2054.25 ml    General: Well developed, well nourished, in no acute distress. Head: Normocephalic, atraumatic, sclera non-icteric Neck: Negative for carotid bruits. JVP not elevated. Lungs: Clear bilaterally to auscultation Rare wheeze  No, rales, or rhonchi. Breathing is unlabored. Heart: RRR with S1 S2. No murmurs, rubs, or gallops appreciated. Abdomen: Soft, non-tender, non-distended with normoactive bowel sounds. No hepatomegaly. No rebound/guarding. No obvious abdominal masses. Msk:  Strength and tone appears normal for age. Extremities: No clubbing, cyanosis or edema.  Distal pedal pulses are 2+ and equal bilaterally. Neuro: Alert and oriented X 3. Moves all extremities spontaneously. Psych:  Responds to questions appropriately with a normal affect.  Labs: Results for orders placed during the hospital encounter of 05/27/12 (from the past 24 hour(s))  CARDIAC PANEL(CRET KIN+CKTOT+MB+TROPI)     Status: Normal   Collection Time   05/27/12  6:33 PM      Component Value Range   Total CK 108  7 - 177 U/L   CK, MB 2.6  0.3 - 4.0 ng/mL   Troponin I <0.30  <0.30 ng/mL   Relative Index 2.4  0.0 - 2.5  HEPARIN LEVEL (UNFRACTIONATED)     Status: Abnormal   Collection Time   05/28/12  6:40 AM      Component Value Range   Heparin Unfractionated 0.24 (*) 0.30 - 0.70 IU/mL  CBC     Status: Normal   Collection Time   05/28/12  6:40 AM      Component Value Range   WBC 5.2  4.0 - 10.5 K/uL   RBC 4.43  3.87 - 5.11 MIL/uL    Hemoglobin 13.3  12.0 - 15.0 g/dL   HCT 16.1  09.6 - 04.5 %   MCV 88.9  78.0 - 100.0 fL   MCH 30.0  26.0 - 34.0 pg   MCHC 33.8  30.0 - 36.0 g/dL   RDW 40.9  81.1 - 91.4 %   Platelets 162  150 - 400 K/uL  BASIC METABOLIC PANEL     Status: Abnormal   Collection Time   05/28/12  6:40 AM  Component Value Range   Sodium 136  135 - 145 mEq/L   Potassium 4.0  3.5 - 5.1 mEq/L   Chloride 103  96 - 112 mEq/L   CO2 25  19 - 32 mEq/L   Glucose, Bld 171 (*) 70 - 99 mg/dL   BUN 9  6 - 23 mg/dL   Creatinine, Ser 4.09  0.50 - 1.10 mg/dL   Calcium 9.3  8.4 - 81.1 mg/dL   GFR calc non Af Amer >90  >90 mL/min   GFR calc Af Amer >90  >90 mL/min    Radiology/Studies: Dg Chest 2 View 05/27/2012  *RADIOLOGY REPORT*  Clinical Data: Sudden onset numbness and tingling in the left arm and hand with chest pain.  CHEST - 2 VIEW  Comparison: Chest 01/21/2012.  CT chest 02/16/2012.  Findings: Volume loss, scarring, and architectural distortion in the right upper lung suggesting resection and / or radiation change.  Findings appear stable since the previous study.  No evidence of developing density or mass.  Normal heart size and pulmonary vascularity.  No focal airspace consolidation.  No blunting of costophrenic angles.  No pneumothorax.  IMPRESSION: Volume loss, scarring, and architectural distortion in the right upper lung likely representing postoperative/post radiation change. No significant change since previous study.  No active infiltration.  Original Report Authenticated By: Marlon Pel, M.D.   Ct Abdomen Pelvis W Contrast 05/28/2012  *RADIOLOGY REPORT*  Clinical Data: Nausea and vomiting. Lung cancer  CT ABDOMEN AND PELVIS WITH CONTRAST  Technique:  Multidetector CT imaging of the abdomen and pelvis was performed following the standard protocol during bolus administration of intravenous contrast.  Contrast: OMNIPAQUE IOHEXOL 300 MG/ML  SOLN  Comparison: 10/02/2011 and 03/30/2010 and 09/16/2005   Findings: 7 mm enhancing focus is identified in the anterior hepatic dome (image 10).  Fatty infiltration of the liver is evident.  The spleen, stomach, duodenum, pancreas, and adrenal glands are unremarkable.  The gallbladder is surgically absent. Tiny cortical defects in the left kidney are too small to characterize but are unchanged likely represent tiny cortical cyst. The right kidney is unremarkable.  No abdominal aortic aneurysm.  There is no free fluid or lymphadenopathy in the abdomen.  Abdominal bowel loops are unremarkable.  Imaging through the pelvis shows no free intraperitoneal fluid. There is no pelvic sidewall lymphadenopathy.  The bladder is unremarkable.  Uterus is surgically absent.  No adnexal mass.  Diverticular changes are seen in the sigmoid colon without diverticulitis.  The terminal ileum and the appendix are normal.  Bone windows reveal no worrisome lytic or sclerotic osseous lesions.  IMPRESSION: No acute findings.  No findings to explain the patient's history of nausea and vomiting.  7 mm enhancing focus in the anterior hepatic dome. While this has not been visualized on any of the recent abdominal CTs, it has a similar appearance to a chest CT from April 2013 and was also visualized on a chest CT from April 2012.  This lesion is very subtle, but visible on a chest CT from 10/20/2010. The differential visualization is almost certainly attributable to slight variations in bolus timing.  Given the tiny size and the fact that it was visible on a study from nearly 2 years ago, it is unlikely to represent a metastatic deposit.  Continued attention to this area on follow-up imaging is recommended.  Sigmoid diverticulosis without diverticulitis.  Original Report Authenticated By: ERIC A. MANSELL, M.D.    BJY:NWGNF rhythm 97.  Prolonged QT  interval.  Q wave in V3.  No significant change from EKG in April 2012.  ASSESSMENT AND PLAN:   1.  Arm pain:  I am not sure what this represents.  I am  not convinced angina.  DOes not appear to be related to CP she had today.  Has ruled out for MI.  WOuld consider noninvasive evaluation with Lexiscan myoview.  2.  CP  Again, I am not convinced cardiac.  Different from previous pain.  QUestion GI dysmotility.  LArge amount of only partially digested food.  COnsider motility study.    3.  HL  COntinue statin  LDL in march was 99, HDL was 38.  I would increase ZOcor to 20  For tighter control  4.  QT prolongation.  Patient has had prolonged QT intervals on EKG  Since at least 2012.  Avoid drugs that prolong.  NO history of palpitations.   2 brothers died of "massive heart attacks" outside of hosp.  No autopsy by patient's knowledge.  No one around them at time of death to say if having CP.  QUestion rhythm problem.   3rd brother has CAD.    Waiting transplant.  Died while waiting. Will review with EP.  Signed, Dietrich Pates 05/28/2012, 5:17 PM

## 2012-05-28 NOTE — Progress Notes (Signed)
[facility] PROGRESS NOTE  [name]:[id number] DOB: 06/27/1946 DOA: 05/27/2012 PCP: [name], MD  Assessment/Plan: Principal Problem:  Atypical chest pain Patient presented to the hospital with atypical chest pain. She ruled out with negative cardiac enzymes normal EKG. Patient was to be discharged today to followup outpatient with  Cardiology for stress test but patient developed some vomiting.  Will ask cardiology to see her inpatient.  CARCINOMA, LUNG, SMALL CELL Stable  DIABETES MELLITUS, TYPE II Lantus plus sliding scale insulin  HYPERLIPIDEMIA Statin medication    COPD (chronic obstructive pulmonary disease) Continues Spiriva and when necessary albuterol  Vomiting Patient had episode of vomiting this morning.  Per nursing she had large amount of emesis.  She states that she does not feel nauseous. Ordered CT scan of the abdomen and pelvis that was negative. Placed her on proton pump inhibitor. Will monitor for the next 24 hours. Patient does not have any abdominal pain or tenderness. If no recurrence of nausea vomiting then will discharge patient to followup outpatient with primary care physician.  Code Status: Full:  Family Communication: Discussed with patient  Disposition Plan: Discharge home when medically stable Molli Posey, MD  [facility] Pager [phone]  If 8PM-8AM, please contact night-coverage [email or web address] Password [email or web address] 05/28/2012, 4:33 PM   LOS: 1 day   Brief narrative: Patient is a white female with past medical history significant for diabetes, dyslipidemia, COPD and lung cancer. Patient presented with pain in her left arm relieved by sublingual nitroglycerin. Patient developed nausea and vomiting during this hospitalization. Evaluation in progress.  Consultants:  Cardiology  Procedures:   CT abdomen pelvis  Antibiotics:  None ?  HPI/Subjective: Left arm pain has resolved.  Objective: Filed Vitals:     05/27/12 2049 05/28/12 0420 05/28/12 1220 05/28/12 1400  BP: 91/51 112/75 130/84 117/73  Pulse: 84 95 92 75  Temp: 97.2 F (36.2 C) 98.5 F (36.9 C) 98 F (36.7 C) 97.7 F (36.5 C)  TempSrc: Oral Oral Oral Oral  Resp: 17 18 18 18   Height:      Weight:      SpO2: 96% 93%  98%    Intake/Output Summary (Last 24 hours) at 05/28/12 1633 Last data filed at 05/28/12 1406  Gross per 24 hour  Intake 2248.75 ml  Output   4303 ml  Net -2054.25 ml    Exam:   General:  Patient laying in bed does not seem to be in any acute distress   Cardiovascular: Regular rate rhythm Respiratory: Clear to auscultation ABD soft nontender nondistended positive bowel EXT no edema  Data Reviewed: Basic Metabolic Panel:  Lab 05/28/12 8295 05/27/12 0305 05/26/12 2337  NA 136 139 140  K 4.0 3.8 --  CL 103 104 103  CO2 25 26 26   GLUCOSE 171* 94 96  BUN 9 10 11   CREATININE 0.63 0.71 0.67  CALCIUM 9.3 9.0 9.4  MG -- -- --  PHOS -- -- --   CBC:  Lab 05/28/12 0640 05/27/12 0305 05/26/12 2337  WBC 5.2 6.3 6.5  NEUTROABS -- -- --  HGB 13.3 12.6 13.4  HCT 39.4 37.1 39.0  MCV 88.9 89.6 89.9  PLT 162 149* 166   Cardiac Enzymes:  Lab 05/27/12 1833 05/27/12 1157 05/27/12 0305  CKTOTAL 108 118 122  CKMB 2.6 2.7 2.3  CKMBINDEX -- -- --  TROPONINI <0.30 <0.30 <0.30    Studies: Dg Chest 2 View  05/27/2012  *RADIOLOGY REPORT*  Clinical Data: Sudden onset  numbness and tingling in the left arm and hand with chest pain.  CHEST - 2 VIEW  Comparison: Chest 01/21/2012.  CT chest 02/16/2012.  Findings: Volume loss, scarring, and architectural distortion in the right upper lung suggesting resection and / or radiation change.  Findings appear stable since the previous study.  No evidence of developing density or mass.  Normal heart size and pulmonary vascularity.  No focal airspace consolidation.  No blunting of costophrenic angles.  No pneumothorax.  IMPRESSION: Volume loss, scarring, and architectural  distortion in the right upper lung likely representing postoperative/post radiation change. No significant change since previous study.  No active infiltration.  Original Report Authenticated By: Marlon Pel, M.D.   Ct Abdomen Pelvis W Contrast  05/28/2012  *RADIOLOGY REPORT*  Clinical Data: Nausea and vomiting. Lung cancer  CT ABDOMEN AND PELVIS WITH CONTRAST  Technique:  Multidetector CT imaging of the abdomen and pelvis was performed following the standard protocol during bolus administration of intravenous contrast.  Contrast: OMNIPAQUE IOHEXOL 300 MG/ML  SOLN  Comparison: 10/02/2011 and 03/30/2010 and 09/16/2005  Findings: 7 mm enhancing focus is identified in the anterior hepatic dome (image 10).  Fatty infiltration of the liver is evident.  The spleen, stomach, duodenum, pancreas, and adrenal glands are unremarkable.  The gallbladder is surgically absent. Tiny cortical defects in the left kidney are too small to characterize but are unchanged likely represent tiny cortical cyst. The right kidney is unremarkable.  No abdominal aortic aneurysm.  There is no free fluid or lymphadenopathy in the abdomen.  Abdominal bowel loops are unremarkable.  Imaging through the pelvis shows no free intraperitoneal fluid. There is no pelvic sidewall lymphadenopathy.  The bladder is unremarkable.  Uterus is surgically absent.  No adnexal mass.  Diverticular changes are seen in the sigmoid colon without diverticulitis.  The terminal ileum and the appendix are normal.  Bone windows reveal no worrisome lytic or sclerotic osseous lesions.  IMPRESSION: No acute findings.  No findings to explain the patient's history of nausea and vomiting.  7 mm enhancing focus in the anterior hepatic dome. While this has not been visualized on any of the recent abdominal CTs, it has a similar appearance to a chest CT from April 2013 and was also visualized on a chest CT from April 2012.  This lesion is very subtle, but visible on a  chest CT from 10/20/2010. The differential visualization is almost certainly attributable to slight variations in bolus timing.  Given the tiny size and the fact that it was visible on a study from nearly 2 years ago, it is unlikely to represent a metastatic deposit.  Continued attention to this area on follow-up imaging is recommended.  Sigmoid diverticulosis without diverticulitis.  Original Report Authenticated By: ERIC A. MANSELL, M.D.    Scheduled Meds:   . DISCONTD: nitroGLYCERIN  0.5 inch Topical Q6H   Continuous Infusions:   . sodium chloride Stopped (05/28/12 1115)  . DISCONTD: heparin Stopped (05/28/12 1115)     Time Spent: 25 min

## 2012-05-28 NOTE — Care Management Note (Unsigned)
    Page 1 of 1   05/28/2012     2:49:31 PM   CARE MANAGEMENT NOTE 05/28/2012  Patient:  [name], [name]   Account Number:  [id number]  Date Initiated:  05/28/2012  Documentation initiated by:  SIMMONS,Ceciley Buist  Subjective/Objective Assessment:   ADMITTED WITH CP; LIVES AT HOME WITH HUSBAND, DAUGHTER AND 2 GRAND KIDS; WAS IPTA;  USES [facility] ON [address] FOR RX.     Action/Plan:   DISCHARGE PLANNING DISCUSSED AT BEDSIDE.   Anticipated DC Date:  05/29/2012   Anticipated DC Plan:  HOME/SELF CARE      DC Planning Services  CM consult      Choice offered to / List presented to:             Status of service:  In process, will continue to follow Medicare Important Message given?   (If response is "NO", the following Medicare IM given date fields will be blank) Date Medicare IM given:   Date Additional Medicare IM given:    Discharge Disposition:    Per UR Regulation:  Reviewed for med. necessity/level of care/duration of stay  If discussed at Long Length of Stay Meetings, dates discussed:    Comments:  05/28/12  1448  Corina Stacy SIMMONS RN, BSN [phone] NCM WILL FOLLOW.

## 2012-05-28 NOTE — Progress Notes (Signed)
ANTICOAGULATION CONSULT NOTE - Follow Up Consult  Pharmacy Consult for heparin Indication: chest pain/ACS  Labs:  Basename 05/28/12 0640 05/27/12 1833 05/27/12 1157 05/27/12 0305 05/27/12 0257 05/26/12 2337  HGB 13.3 -- -- 12.6 -- --  HCT 39.4 -- -- 37.1 -- 39.0  PLT 162 -- -- 149* -- 166  APTT -- -- -- -- -- --  LABPROT -- -- -- -- -- --  INR -- -- -- -- -- --  HEPARINUNFRC 0.24* -- 0.48 -- <0.10* --  CREATININE -- -- -- 0.71 -- 0.67  CKTOTAL -- 108 118 122 -- --  CKMB -- 2.6 2.7 2.3 -- --  TROPONINI -- <0.30 <0.30 <0.30 -- --    Assessment: 66yo female now subtherapeutic on heparin after one level at goal.  Goal of Therapy:  Heparin level 0.3-0.7 units/ml   Plan:  Will increase heparin gtt by 2 units/kg/hr to 1150 units/hr and check level in 6hr.  Colleen Can PharmD BCPS 05/28/2012,7:38 AM

## 2012-05-29 ENCOUNTER — Inpatient Hospital Stay (HOSPITAL_COMMUNITY): Payer: Medicare Other

## 2012-05-29 DIAGNOSIS — R079 Chest pain, unspecified: Secondary | ICD-10-CM

## 2012-05-29 DIAGNOSIS — R1111 Vomiting without nausea: Secondary | ICD-10-CM | POA: Diagnosis present

## 2012-05-29 DIAGNOSIS — R1110 Vomiting, unspecified: Secondary | ICD-10-CM

## 2012-05-29 LAB — GLUCOSE, CAPILLARY: Glucose-Capillary: 152 mg/dL — ABNORMAL HIGH (ref 70–99)

## 2012-05-29 MED ORDER — ONDANSETRON HCL 4 MG/2ML IJ SOLN
INTRAMUSCULAR | Status: AC
  Filled 2012-05-29: qty 2

## 2012-05-29 MED ORDER — REGADENOSON 0.4 MG/5ML IV SOLN
INTRAVENOUS | Status: AC
  Filled 2012-05-29: qty 5

## 2012-05-29 MED ORDER — INSULIN GLARGINE 100 UNIT/ML ~~LOC~~ SOLN
12.0000 [IU] | Freq: Every day | SUBCUTANEOUS | Status: DC
  Administered 2012-05-29 – 2012-05-30 (×2): 12 [IU] via SUBCUTANEOUS

## 2012-05-29 MED ORDER — TECHNETIUM TC 99M TETROFOSMIN IV KIT
30.0000 | PACK | Freq: Once | INTRAVENOUS | Status: AC | PRN
  Administered 2012-05-29: 30 via INTRAVENOUS

## 2012-05-29 MED ORDER — TECHNETIUM TC 99M TETROFOSMIN IV KIT
10.0000 | PACK | Freq: Once | INTRAVENOUS | Status: AC | PRN
  Administered 2012-05-29: 10 via INTRAVENOUS

## 2012-05-29 NOTE — Progress Notes (Signed)
[facility] PROGRESS NOTE  [name] WUJ:[id number] DOB: 1945-12-01 DOA: 05/27/2012 PCP: [name], MD  Assessment/Plan:  Atypical chest pain Patient presented to the hospital with atypical chest pain. She ruled out with negative cardiac enzymes normal EKG. Patient was to be discharged today to followup outpatient with  Cardiology for stress test but patient developed some vomiting.  Asked cardiology to evaluate her inpatient. Patient had Lexisscan done today. Prolonged QTC monitored per cardiology.  CARCINOMA, LUNG, SMALL CELL Stable  DIABETES MELLITUS, TYPE II Lantus plus sliding scale insulin  HYPERLIPIDEMIA Statin medication    COPD (chronic obstructive pulmonary disease) Continues Spiriva and when necessary albuterol  Vomiting No further vomiting noted today. Patient feels fine. CT scan of the abdomen and pelvis negative.   Code Status: Full:  Family Communication: Discussed with patient  Disposition Plan: Discharge home when medically stable Molli Posey, MD  [facility] Pager [phone]  If 8PM-8AM, please contact night-coverage [email or web address] Password [email or web address] 05/28/2012, 4:33 PM   LOS: 1 day   Brief narrative: Patient is a 66 yo white female with past medical history significant for diabetes, dyslipidemia, COPD and lung cancer. Patient presented with pain in her left arm relieved by sublingual nitroglycerin. Patient developed vomiting  on 8/5, CT scan of the abdomen and pelvis negative. Patient was evaluated by cardiology for her chest pain and had a Lexiscan done on 8/6.  The result is still pending.  Consultants:  Cardiology  Procedures:   CT abdomen pelvis  Lexiscan  Antibiotics:  None ?  HPI/Subjective: Left arm pain has resolved.  Objective: Filed Vitals:   05/27/12 2049 05/28/12 0420 05/28/12 1220 05/28/12 1400  BP: 91/51 112/75 130/84 117/73  Pulse: 84 95 92 75  Temp: 97.2 F (36.2 C) 98.5 F (36.9 C) 98 F  (36.7 C) 97.7 F (36.5 C)  TempSrc: Oral Oral Oral Oral  Resp: 17 18 18 18   Height:      Weight:      SpO2: 96% 93%  98%    Intake/Output Summary (Last 24 hours) at 05/28/12 1633 Last data filed at 05/28/12 1406  Gross per 24 hour  Intake 2248.75 ml  Output   4303 ml  Net -2054.25 ml    Exam:   General:  Patient laying in bed does not seem to be in any acute distress   Cardiovascular: Regular rate rhythm Respiratory: Clear to auscultation ABD soft nontender nondistended positive bowel EXT no edema  Data Reviewed: Basic Metabolic Panel:  Lab 05/28/12 8657 05/27/12 0305 05/26/12 2337  NA 136 139 140  K 4.0 3.8 --  CL 103 104 103  CO2 25 26 26   GLUCOSE 171* 94 96  BUN 9 10 11   CREATININE 0.63 0.71 0.67  CALCIUM 9.3 9.0 9.4  MG -- -- --  PHOS -- -- --   CBC:  Lab 05/28/12 0640 05/27/12 0305 05/26/12 2337  WBC 5.2 6.3 6.5  NEUTROABS -- -- --  HGB 13.3 12.6 13.4  HCT 39.4 37.1 39.0  MCV 88.9 89.6 89.9  PLT 162 149* 166   Cardiac Enzymes:  Lab 05/27/12 1833 05/27/12 1157 05/27/12 0305  CKTOTAL 108 118 122  CKMB 2.6 2.7 2.3  CKMBINDEX -- -- --  TROPONINI <0.30 <0.30 <0.30    Studies: Dg Chest 2 View  05/27/2012  *RADIOLOGY REPORT*  Clinical Data: Sudden onset numbness and tingling in the left arm and hand with chest pain.  CHEST - 2 VIEW  Comparison: Chest 01/21/2012.  CT  chest 02/16/2012.  Findings: Volume loss, scarring, and architectural distortion in the right upper lung suggesting resection and / or radiation change.  Findings appear stable since the previous study.  No evidence of developing density or mass.  Normal heart size and pulmonary vascularity.  No focal airspace consolidation.  No blunting of costophrenic angles.  No pneumothorax.  IMPRESSION: Volume loss, scarring, and architectural distortion in the right upper lung likely representing postoperative/post radiation change. No significant change since previous study.  No active infiltration.   Original Report Authenticated By: Marlon Pel, M.D.   Ct Abdomen Pelvis W Contrast  05/28/2012  *RADIOLOGY REPORT*  Clinical Data: Nausea and vomiting. Lung cancer  CT ABDOMEN AND PELVIS WITH CONTRAST  Technique:  Multidetector CT imaging of the abdomen and pelvis was performed following the standard protocol during bolus administration of intravenous contrast.  Contrast: OMNIPAQUE IOHEXOL 300 MG/ML  SOLN  Comparison: 10/02/2011 and 03/30/2010 and 09/16/2005  Findings: 7 mm enhancing focus is identified in the anterior hepatic dome (image 10).  Fatty infiltration of the liver is evident.  The spleen, stomach, duodenum, pancreas, and adrenal glands are unremarkable.  The gallbladder is surgically absent. Tiny cortical defects in the left kidney are too small to characterize but are unchanged likely represent tiny cortical cyst. The right kidney is unremarkable.  No abdominal aortic aneurysm.  There is no free fluid or lymphadenopathy in the abdomen.  Abdominal bowel loops are unremarkable.  Imaging through the pelvis shows no free intraperitoneal fluid. There is no pelvic sidewall lymphadenopathy.  The bladder is unremarkable.  Uterus is surgically absent.  No adnexal mass.  Diverticular changes are seen in the sigmoid colon without diverticulitis.  The terminal ileum and the appendix are normal.  Bone windows reveal no worrisome lytic or sclerotic osseous lesions.  IMPRESSION: No acute findings.  No findings to explain the patient's history of nausea and vomiting.  7 mm enhancing focus in the anterior hepatic dome. While this has not been visualized on any of the recent abdominal CTs, it has a similar appearance to a chest CT from April 2013 and was also visualized on a chest CT from April 2012.  This lesion is very subtle, but visible on a chest CT from 10/20/2010. The differential visualization is almost certainly attributable to slight variations in bolus timing.  Given the tiny size and the fact  that it was visible on a study from nearly 2 years ago, it is unlikely to represent a metastatic deposit.  Continued attention to this area on follow-up imaging is recommended.  Sigmoid diverticulosis without diverticulitis.  Original Report Authenticated By: ERIC A. MANSELL, M.D.    Scheduled Meds:   . DISCONTD: nitroGLYCERIN  0.5 inch Topical Q6H   Continuous Infusions:   . sodium chloride Stopped (05/28/12 1115)  . DISCONTD: heparin Stopped (05/28/12 1115)     Time Spent: 25 min

## 2012-05-29 NOTE — Progress Notes (Signed)
Subjective:  Patient seen today.  She is feeling better.  Review of old cath records revealed non obstructive disease in 2006, with mild distal LAD findings.  I have not seen her in the office, but she was sent directly for cath in 2006 by Dr. Cato Mulligan.  She continued to smoke through 2012, and quit at that time.  Her symptoms have some atypical features.  She has localized chest pain along the right sternum, arm pain, and sudden vomiting, all of which are separate.  She has been pain free since yesterday, and is scheduled for nuc imaging today.    Objective:  Vital Signs in the last 24 hours: Temp:  [97.5 F (36.4 C)-98.4 F (36.9 C)] 98.4 F (36.9 C) (08/06 0505) Pulse Rate:  [73-92] 73  (08/06 0505) Resp:  [16-20] 16  (08/06 0505) BP: (100-130)/(64-84) 100/64 mmHg (08/06 0505) SpO2:  [96 %-98 %] 96 % (08/06 0505)  Intake/Output from previous day: 08/05 0701 - 08/06 0700 In: 1440 [P.O.:1440] Out: 3903 [Urine:3900; Emesis/NG output:3]   Physical Exam: General: Well developed, well nourished, in no acute distress. Head:  Normocephalic and atraumatic. Lungs: Clear to auscultation and percussion.  Prolonged expiration with decrease BS in upper area.   Heart: Normal S1 and S2.  No murmur, rubs or gallops.  Pulses: Pulses normal in all 4 extremities. Extremities: No clubbing or cyanosis. No edema. Neurologic: Alert and oriented x 3.    Lab Results:  Basename 05/28/12 0640 05/27/12 0305  WBC 5.2 6.3  HGB 13.3 12.6  PLT 162 149*    Basename 05/28/12 0640 05/27/12 0305  NA 136 139  K 4.0 3.8  CL 103 104  CO2 25 26  GLUCOSE 171* 94  BUN 9 10  CREATININE 0.63 0.71    Basename 05/27/12 1833 05/27/12 1157  TROPONINI <0.30 <0.30   Hepatic Function Panel  Basename 05/28/12 1819  PROT 7.1  ALBUMIN 3.6  AST 22  ALT 16  ALKPHOS 87  BILITOT 0.3  BILIDIR <0.1  IBILI NOT CALCULATED   No results found for this basename: CHOL in the last 72 hours No results found for this  basename: PROTIME in the last 72 hours  Imaging: Ct Abdomen Pelvis W Contrast  05/28/2012  *RADIOLOGY REPORT*  Clinical Data: Nausea and vomiting. Lung cancer  CT ABDOMEN AND PELVIS WITH CONTRAST  Technique:  Multidetector CT imaging of the abdomen and pelvis was performed following the standard protocol during bolus administration of intravenous contrast.  Contrast: OMNIPAQUE IOHEXOL 300 MG/ML  SOLN  Comparison: 10/02/2011 and 03/30/2010 and 09/16/2005  Findings: 7 mm enhancing focus is identified in the anterior hepatic dome (image 10).  Fatty infiltration of the liver is evident.  The spleen, stomach, duodenum, pancreas, and adrenal glands are unremarkable.  The gallbladder is surgically absent. Tiny cortical defects in the left kidney are too small to characterize but are unchanged likely represent tiny cortical cyst. The right kidney is unremarkable.  No abdominal aortic aneurysm.  There is no free fluid or lymphadenopathy in the abdomen.  Abdominal bowel loops are unremarkable.  Imaging through the pelvis shows no free intraperitoneal fluid. There is no pelvic sidewall lymphadenopathy.  The bladder is unremarkable.  Uterus is surgically absent.  No adnexal mass.  Diverticular changes are seen in the sigmoid colon without diverticulitis.  The terminal ileum and the appendix are normal.  Bone windows reveal no worrisome lytic or sclerotic osseous lesions.  IMPRESSION: No acute findings.  No findings to explain  the patient's history of nausea and vomiting.  7 mm enhancing focus in the anterior hepatic dome. While this has not been visualized on any of the recent abdominal CTs, it has a similar appearance to a chest CT from April 2013 and was also visualized on a chest CT from April 2012.  This lesion is very subtle, but visible on a chest CT from 10/20/2010. The differential visualization is almost certainly attributable to slight variations in bolus timing.  Given the tiny size and the fact that it was  visible on a study from nearly 2 years ago, it is unlikely to represent a metastatic deposit.  Continued attention to this area on follow-up imaging is recommended.  Sigmoid diverticulosis without diverticulitis.  Original Report Authenticated By: ERIC A. MANSELL, M.D.    EKG:  No acute changes on ECG.  QTc is prolonged.  Hard to tell if this dates back to before.      Assessment/Plan:  Patient Active Hospital Problem List: Atypical chest pain (05/27/2012)   Assessment: hard to know exactly what precipitated this.  Multiple components.  Abnormal nuc in past   Plan: on her way down to nuclear for imaging.   CARCINOMA, LUNG, SMALL CELL (01/18/2009)   Assessment: sp treatment   Plan: seen in onc clinic   Regarding long QTc, it has been borderline.  We do not have old ECG beyond 2012 at which time it was borderline.  Will await results of nuclear testing and decide on next plan.  Consider d-dimer also.        Shawnie Pons, MD, [facility], FSCAI 05/29/2012, 7:44 AM

## 2012-05-29 NOTE — Plan of Care (Signed)
Problem: Phase II Progression Outcomes Goal: Other Phase II Outcomes/Goals Outcome: Completed/Met Date Met:  05/29/12 Myoview 05/29/12

## 2012-05-30 ENCOUNTER — Encounter (HOSPITAL_COMMUNITY)
Admission: EM | Disposition: A | Discharge status: Home / Self Care | Payer: Self-pay | Source: Home / Self Care | Attending: Internal Medicine

## 2012-05-30 DIAGNOSIS — E1165 Type 2 diabetes mellitus with hyperglycemia: Secondary | ICD-10-CM

## 2012-05-30 DIAGNOSIS — I2510 Atherosclerotic heart disease of native coronary artery without angina pectoris: Secondary | ICD-10-CM

## 2012-05-30 DIAGNOSIS — IMO0002 Reserved for concepts with insufficient information to code with codable children: Secondary | ICD-10-CM

## 2012-05-30 HISTORY — PX: LEFT HEART CATHETERIZATION WITH CORONARY ANGIOGRAM: SHX5451

## 2012-05-30 LAB — CBC
Hemoglobin: 13.6 g/dL (ref 12.0–15.0)
MCH: 30.3 pg (ref 26.0–34.0)
MCHC: 33.4 g/dL (ref 30.0–36.0)
MCV: 90.6 fL (ref 78.0–100.0)
RBC: 4.49 MIL/uL (ref 3.87–5.11)

## 2012-05-30 LAB — BASIC METABOLIC PANEL
BUN: 10 mg/dL (ref 6–23)
CO2: 29 mEq/L (ref 19–32)
Calcium: 9.5 mg/dL (ref 8.4–10.5)
Creatinine, Ser: 0.74 mg/dL (ref 0.50–1.10)
GFR calc non Af Amer: 89 mL/min — ABNORMAL LOW (ref >90)
Glucose, Bld: 151 mg/dL — ABNORMAL HIGH (ref 70–99)

## 2012-05-30 LAB — GLUCOSE, CAPILLARY
Glucose-Capillary: 123 mg/dL — ABNORMAL HIGH (ref 70–99)
Glucose-Capillary: 129 mg/dL — ABNORMAL HIGH (ref 70–99)

## 2012-05-30 SURGERY — LEFT HEART CATHETERIZATION WITH CORONARY ANGIOGRAM
Anesthesia: LOCAL

## 2012-05-30 MED ORDER — SODIUM CHLORIDE 0.9 % IV SOLN: 250.0000 mL | INTRAVENOUS | Status: DC | PRN

## 2012-05-30 MED ORDER — NITROGLYCERIN 0.2 MG/ML ON CALL CATH LAB
INTRAVENOUS | Status: AC
  Filled 2012-05-30: qty 1

## 2012-05-30 MED ORDER — LIDOCAINE HCL (PF) 1 % IJ SOLN
INTRAMUSCULAR | Status: AC
  Filled 2012-05-30: qty 30

## 2012-05-30 MED ORDER — HEPARIN (PORCINE) IN NACL 2-0.9 UNIT/ML-% IJ SOLN
INTRAMUSCULAR | Status: AC
  Filled 2012-05-30: qty 2000

## 2012-05-30 MED ORDER — VERAPAMIL HCL 2.5 MG/ML IV SOLN
INTRAVENOUS | Status: AC
  Filled 2012-05-30: qty 2

## 2012-05-30 MED ORDER — HEPARIN SODIUM (PORCINE) 1000 UNIT/ML IJ SOLN
INTRAMUSCULAR | Status: AC
  Filled 2012-05-30: qty 1

## 2012-05-30 MED ORDER — FENTANYL CITRATE 0.05 MG/ML IJ SOLN
INTRAMUSCULAR | Status: AC
  Filled 2012-05-30: qty 2

## 2012-05-30 MED ORDER — SODIUM CHLORIDE 0.9 % IJ SOLN: 3.0000 mL | Freq: Two times a day (BID) | INTRAMUSCULAR | Status: DC

## 2012-05-30 MED ORDER — SODIUM CHLORIDE 0.9 % IV SOLN: INTRAVENOUS | Status: AC

## 2012-05-30 MED ORDER — SODIUM CHLORIDE 0.9 % IJ SOLN: 3.0000 mL | INTRAMUSCULAR | Status: DC | PRN

## 2012-05-30 MED ORDER — MIDAZOLAM HCL 2 MG/2ML IJ SOLN
INTRAMUSCULAR | Status: AC
  Filled 2012-05-30: qty 2

## 2012-05-30 MED ORDER — SODIUM CHLORIDE 0.9 % IV SOLN: INTRAVENOUS | Status: DC

## 2012-05-30 NOTE — H&P (View-Only) (Signed)
Subjective:  History carefully reviewed.  No chest pain today.  She says she has had symptoms for the past two months.  They also can be associated with exertion  (Class II).  It lasts about a minute.  It is central and substernal.  She is better since the vomiting episode.  Nuclear data also reviewed.    Objective:  Vital Signs in the last 24 hours: Temp:  [97.5 F (36.4 C)-98.2 F (36.8 C)] 97.9 F (36.6 C) (08/07 0431) Pulse Rate:  [76-122] 76  (08/07 0431) Resp:  [18-20] 20  (08/07 0431) BP: (100-152)/(64-78) 110/64 mmHg (08/07 0431) SpO2:  [90 %-94 %] 90 % (08/07 0431)  Intake/Output from previous day: 08/06 0701 - 08/07 0700 In: 960 [P.O.:960] Out: 650 [Urine:650]   Physical Exam: General: Well developed, well nourished, in no acute distress. Head:  Normocephalic and atraumatic. Lungs: Clear to auscultation and percussion. Heart: Normal S1 and S2.  No murmur, rubs or gallops.  Pulses: Pulses normal in all 4 extremities. Extremities: No clubbing or cyanosis. No edema. Neurologic: Alert and oriented x 3.    Lab Results:  Basename 05/30/12 0530 05/28/12 0640  WBC 5.8 5.2  HGB 13.6 13.3  PLT 154 162    Basename 05/30/12 0530 05/28/12 0640  NA 140 136  K 3.9 4.0  CL 102 103  CO2 29 25  GLUCOSE 151* 171*  BUN 10 9  CREATININE 0.74 0.63    Basename 05/27/12 1833 05/27/12 1157  TROPONINI <0.30 <0.30   Hepatic Function Panel  Basename 05/28/12 1819  PROT 7.1  ALBUMIN 3.6  AST 22  ALT 16  ALKPHOS 87  BILITOT 0.3  BILIDIR <0.1  IBILI NOT CALCULATED   No results found for this basename: CHOL in the last 72 hours No results found for this basename: PROTIME in the last 72 hours  Imaging: Ct Abdomen Pelvis W Contrast  05/28/2012  *RADIOLOGY REPORT*  Clinical Data: Nausea and vomiting. Lung cancer  CT ABDOMEN AND PELVIS WITH CONTRAST  Technique:  Multidetector CT imaging of the abdomen and pelvis was performed following the standard protocol during bolus  administration of intravenous contrast.  Contrast: 100mL OMNIPAQUE IOHEXOL 300 MG/ML  SOLN  Comparison: 10/02/2011 and 03/30/2010 and 09/16/2005  Findings: 7 mm enhancing focus is identified in the anterior hepatic dome (image 10).  Fatty infiltration of the liver is evident.  The spleen, stomach, duodenum, pancreas, and adrenal glands are unremarkable.  The gallbladder is surgically absent. Tiny cortical defects in the left kidney are too small to characterize but are unchanged likely represent tiny cortical cyst. The right kidney is unremarkable.  No abdominal aortic aneurysm.  There is no free fluid or lymphadenopathy in the abdomen.  Abdominal bowel loops are unremarkable.  Imaging through the pelvis shows no free intraperitoneal fluid. There is no pelvic sidewall lymphadenopathy.  The bladder is unremarkable.  Uterus is surgically absent.  No adnexal mass.  Diverticular changes are seen in the sigmoid colon without diverticulitis.  The terminal ileum and the appendix are normal.  Bone windows reveal no worrisome lytic or sclerotic osseous lesions.  IMPRESSION: No acute findings.  No findings to explain the patient's history of nausea and vomiting.  7 mm enhancing focus in the anterior hepatic dome. While this has not been visualized on any of the recent abdominal CTs, it has a similar appearance to a chest CT from April 2013 and was also visualized on a chest CT from April 2012.  This lesion is   very subtle, but visible on a chest CT from 10/20/2010. The differential visualization is almost certainly attributable to slight variations in bolus timing.  Given the tiny size and the fact that it was visible on a study from nearly 2 years ago, it is unlikely to represent a metastatic deposit.  Continued attention to this area on follow-up imaging is recommended.  Sigmoid diverticulosis without diverticulitis.  Original Report Authenticated By: ERIC A. MANSELL, M.D.   Nm Myocar Multi W/spect W/wall Motion /  Ef  05/29/2012  *RADIOLOGY REPORT*  Clinical Data:  The 65-year-old female with chest pain.  History of nonobstructive coronary artery disease.  MYOCARDIAL IMAGING WITH SPECT (REST AND PHARMACOLOGIC-STRESS) GATED LEFT VENTRICULAR WALL MOTION STUDY LEFT VENTRICULAR EJECTION FRACTION  Technique:  Resting myocardial SPECT imaging was initially performed after intravenous administration of radiopharmaceutical. Myocardial SPECT was subsequently performed after additional radiopharmaceutical injection during pharmacologic-stress supervised by the Cardiology staff.  Quantitative gated imaging was also performed to evaluate left ventricular wall motion, and estimate left ventricular ejection fraction.  Radiopharmaceutical:  Tc-99m Myoview 10mCi at rest and 30mCi during stress.  Comparison: none  Findings:  Technique: Study is adequate.  Perfusion:  There are decreased counts within the mid and apical segment of the septal wall which are fixed on stress and rest.  No decreased counts to suggest reversible ischemia.  Wall motion:  Septal hypokinesia.  Left ventricular ejection fraction:  Calculated left ventricular ejection fraction =  greater than 70%  IMPRESSION:  1.  No reversible ischemia. 2.  Septal infarct with hypokinesia.  3.  Left ventricular ejection fraction equal greater than 70%  Original Report Authenticated By: STEWART EDMUNDS, M.D.    EKG:  ECG read as anterior MI, age indeterminate.  There are Qs through lead V3  Nuclear images: Perfusion: There are decreased counts within the mid and apical  segment of the septal wall which are fixed on stress and rest. No  decreased counts to suggest reversible ischemia.  Wall motion: Septal hypokinesia.  Left ventricular ejection fraction: Calculated left ventricular  ejection fraction = greater than 70%  IMPRESSION:  1. No reversible ischemia.  2. Septal infarct with hypokinesia.  3. Left ventricular ejection fraction equal greater than 70%  Original  Report Authenticated By: STEWART EDMUNDS, M.D.      Assessment/Plan:  Patient Active Hospital Problem List: Atypical chest pain (05/27/2012)   Assessment: symptoms and risk factors make likelihood of CAD higher than I would be comfortable with sitting on.  Importantly, a normal wall motion with normal EF, with "infarct", is a severe ischemia equivalent per se----   Plan: I have discussed with her the options.  This eventually will need to be resolved in light of abnormality.  Her scan was abnormal as well in the past, so we do not have a good alternative except for possibly stress dobutamine.  I think this would provide definitive data and she is agreeable.   CARCINOMA, LUNG, SMALL CELL (01/18/2009)   Assessment: see CT scan   Plan: followed by oncology   She has been cathed before.  Risks explained and she is agreeable.   Will begin hydration.     Aurore Redinger, MD, FACC, FSCAI 05/30/2012, 9:22 AM    

## 2012-05-30 NOTE — Interval H&P Note (Signed)
History and Physical Interval Note:  05/30/2012 12:41 PM  [name]  has presented today for surgery, with the diagnosis of Chest pain  The various methods of treatment have been discussed with the patient and family. After consideration of risks, benefits and other options for treatment, the patient has consented to  Procedure(s) (LRB): LEFT HEART CATHETERIZATION WITH CORONARY ANGIOGRAM (N/A) as a surgical intervention .  The patient's history has been reviewed, patient examined, no change in status, stable for surgery.  I have reviewed the patient's chart and labs.  Questions were answered to the patient's satisfaction.     [name]

## 2012-05-30 NOTE — CV Procedure (Addendum)
    Cardiac Catheterization Operative Report  ZORIAH PULICE [id number] 8/7/20131:27 PM [name], MD  Procedure Performed:  1. Left Heart Catheterization 2. Selective Coronary Angiography 3. Left ventricular angiogram  Operator: Verne Carrow, MD  Arterial access site:  Right radial artery.   Indication: Chest pain, abnormal myoview with possible septal scar, no ischemia. Cardiac cath arranged by Dr. [name] to exclude obstructive CAD with high probability of disease based on objective data. Last cath 2006 with mild disease in the LAD and Diagonal.                              Procedure Details: The risks, benefits, complications, treatment options, and expected outcomes were discussed with the patient. The patient and/or family concurred with the proposed plan, giving informed consent. The patient was brought to the cath lab after IV hydration was begun and oral premedication was given. The patient was further sedated with Versed and Fentanyl. The right wrist was assessed with an Allens test which was positive. The right wrist was prepped and draped in a sterile fashion. 1% lidocaine was used for local anesthesia. Using the modified Seldinger access technique, a 5 French sheath was placed in the right radial artery. 3 mg Verapamil was given through the sheath. 4000 units IV heparin was given. A JR 4 catheter was used to engage the RCA. An EBU 3.0 guide was used to engage the left main. A pigtail catheter was used to perform a left ventricular angiogram. The sheath was removed from the right radial artery and a Terumo hemostasis band was applied at the arteriotomy site on the right wrist.   There were no immediate complications. The patient was taken to the recovery area in stable condition.   Hemodynamic Findings: Central aortic pressure: 118/56 Left ventricular pressure: 131/5/12  Angiographic Findings:  Left main: No obstructive disease.   Left Anterior Descending  Artery:  Moderate sized vessel with luminal irregularities in the mid segment. There are two diagonal branches. The first diagonal branch is moderate sized with mild plaque at the ostium. The second diagonal branch is very small. The LAD quickly tapers to a small caliber vessel beyond the second diagonal branch and wraps around the apex.   Circumflex Artery: Moderate sized vessel with no obstructive disease noted. The first marginal is very small. The second marginal branch is moderate sized with no disease. The distal AV groove Circumflex is small with no disease noted.   Right Coronary Artery: Moderate sized vessel with 30% proximal stenosis, 30% tubular mid to distal stenosis.   Left Ventricular Angiogram: LVEF=65%. No WMA. No MR  Impression: 1. Mild, non-obstructive CAD 2. Normal LV systolic function 3. Non-cardiac chest pain  Recommendations: Medical management including ASA and statin. Should be ok to discharge today after bedrest. She can f/u with Dr. [name] in our [facility] office in several weeks.        Complications:  None. The patient tolerated the procedure well.

## 2012-05-30 NOTE — Discharge Summary (Signed)
Physician Discharge Summary  [name] UYQ:[id number] DOB: [birth year] DOA: 05/27/2012  PCP: [name], MD  Admit date: 05/27/2012 Discharge date: 05/30/2012  Recommendations for Outpatient Follow-up:  1. Followup with [name], MD (PCP) in 1 week as needed. 2. Followup with Dr. [name] (Cardiology) in 2-3 weeks.  Discharge Diagnoses:  Principal Problem:  *Atypical chest pain Active Problems:  CARCINOMA, LUNG, SMALL CELL  DM (diabetes mellitus), type 2, uncontrolled with complications  HYPERLIPIDEMIA  GERD  COPD (chronic obstructive pulmonary disease)  Vomiting without nausea   Discharge Condition: Stable  Diet recommendation: Heart healthy diet  Wt Readings from Last 3 Encounters:  05/27/12 80.513 kg (177 lb 8 oz)  05/27/12 80.513 kg (177 lb 8 oz)  02/21/12 81.421 kg (179 lb 8 oz)    History of present illness:  66 year old female who presented to the emergency department with complaints of severe heaviness and numbness in her left arm on 05/27/2012  Hospital Course:  Atypical chest pain  Patient was initially admitted to telemetry unit, cardiac enzymes were cycled and was ruled out for acute coronary syndrome.  Patient was to have been discharged earlier during the hospital stay, with outpatient followup for stress test, however patient had nausea with vomiting, cardiology was asked to evaluate the patient while the patient was inpatient.  A stress test was performed with results as indicated below.  Cardiology performed cardiac catheter on 05/30/2012 which showed mild, nonobstructive coronary disease, normal LV systolic function.  Cardiology recommended having the patient follow up with Dr. [name] in few weeks.  CARCINOMA, LUNG, SMALL CELL  Stable   Diabetes type 2 with complications Lantus plus sliding scale insulin.  Resume 7030 at discharge.  HYPERLIPIDEMIA  Statin medication.  COPD (chronic obstructive pulmonary disease)  Continues Spiriva  and when necessary albuterol   Vomiting  No further episodes. CT scan of the abdomen and pelvis negative.  Procedures:  Myocardial perfusion scan on 05/29/2012.  Cardiac catheterization 05/30/2012.  Consultations:  Dr. [name], Cardiology.  Discharge Exam: Filed Vitals:   05/30/12 1230  BP:   Pulse: 71  Temp:   Resp:    Filed Vitals:   05/29/12 2058 05/30/12 0431 05/30/12 1152 05/30/12 1230  BP: 100/69 110/64 93/55   Pulse: 86 76 79 71  Temp: 98.2 F (36.8 C) 97.9 F (36.6 C) 98.6 F (37 C)   TempSrc: Oral Oral Oral   Resp: 18 20 20    Height:      Weight:      SpO2: 92% 90% 95%    Discharge Instructions  Discharge Orders    Future Appointments: Provider: Department: Dept Phone: Center:   07/06/2012 8:30 AM Lindley Magnus, MD [facility] [phone] [facility]   08/17/2012 9:30 AM Delcie Roch [facility] [phone] None   08/17/2012 10:00 AM [facility] [phone] [place]   08/21/2012 9:30 AM Si Gaul, MD [facility] [phone] None     Future Orders Please Complete By Expires   Diet Carb Modified      Increase activity slowly      Discharge instructions      Comments:   Followup with [name], MD (PCP) in 1 week as needed. Followup with Dr. [name] (cardiology) in 2-3 weeks.     Medication List  As of 05/30/2012  2:10 PM   TAKE these medications         albuterol 108 (90 BASE) MCG/ACT inhaler   Commonly known as: PROVENTIL HFA;VENTOLIN HFA   Inhale  2 puffs into the lungs every 6 (six) hours as needed for wheezing or shortness of breath. For shortness of breath      aspirin 81 MG tablet   Take 81 mg by mouth daily.      insulin NPH-insulin regular (70-30) 100 UNIT/ML injection   Commonly known as: NOVOLIN 70/30   25 units in a.m. And 22 units in the evening. Take with meals      loratadine 10 MG tablet   Commonly known as: CLARITIN   Take 10 mg by mouth daily.      nitroGLYCERIN 0.4 MG SL tablet   Commonly  known as: NITROSTAT   Place 0.4 mg under the tongue every 5 (five) minutes as needed. For chest pain      simvastatin 10 MG tablet   Commonly known as: ZOCOR   Take 10 mg by mouth at bedtime.      tiotropium 18 MCG inhalation capsule   Commonly known as: SPIRIVA   Place 18 mcg into inhaler and inhale daily as needed. For shortness of breath           Follow-up Information    Follow up with [name], MD. Schedule an appointment as soon as possible for a visit in 1 week. (As needed)    Contact information:   [address] [place] [address] [phone]       Follow up with Shawnie Pons, MD. Schedule an appointment as soon as possible for a visit in 2 weeks.   Contact information:   [address] [place] [address] [phone]           The results of significant diagnostics from this hospitalization (including imaging, microbiology, ancillary and laboratory) are listed below for reference.    Significant Diagnostic Studies: Dg Chest 2 View  05/27/2012  *RADIOLOGY REPORT*  Clinical Data: Sudden onset numbness and tingling in the left arm and hand with chest pain.  CHEST - 2 VIEW  Comparison: Chest 01/21/2012.  CT chest 02/16/2012.  Findings: Volume loss, scarring, and architectural distortion in the right upper lung suggesting resection and / or radiation change.  Findings appear stable since the previous study.  No evidence of developing density or mass.  Normal heart size and pulmonary vascularity.  No focal airspace consolidation.  No blunting of costophrenic angles.  No pneumothorax.  IMPRESSION: Volume loss, scarring, and architectural distortion in the right upper lung likely representing postoperative/post radiation change. No significant change since previous study.  No active infiltration.  Original Report Authenticated By: Marlon Pel, M.D.   Ct Abdomen Pelvis W  Contrast  05/28/2012  *RADIOLOGY REPORT*  Clinical Data: Nausea and vomiting. Lung cancer  CT ABDOMEN AND PELVIS WITH CONTRAST  Technique:  Multidetector CT imaging of the abdomen and pelvis was performed following the standard protocol during bolus administration of intravenous contrast.  Contrast: OMNIPAQUE IOHEXOL 300 MG/ML  SOLN  Comparison: 10/02/2011 and 03/30/2010 and 09/16/2005  Findings: 7 mm enhancing focus is identified in the anterior hepatic dome (image 10).  Fatty infiltration of the liver is evident.  The spleen, stomach, duodenum, pancreas, and adrenal glands are unremarkable.  The gallbladder is surgically absent. Tiny cortical defects in the left kidney are too small to characterize but are unchanged likely represent tiny cortical cyst. The right kidney is unremarkable.  No abdominal aortic aneurysm.  There is no free fluid or lymphadenopathy in the abdomen.  Abdominal bowel loops are unremarkable.  Imaging through the pelvis shows no free intraperitoneal fluid. There is no pelvic sidewall lymphadenopathy.  The bladder is unremarkable.  Uterus is surgically absent.  No adnexal mass.  Diverticular changes are seen in the sigmoid colon without diverticulitis.  The terminal ileum and the appendix are normal.  Bone windows reveal no worrisome lytic or sclerotic osseous lesions.  IMPRESSION: No acute findings.  No findings to explain the patient's history of nausea and vomiting.  7 mm enhancing focus in the anterior hepatic dome. While this has not been visualized on any of the recent abdominal CTs, it has a similar appearance to a chest CT from April 2013 and was also visualized on a chest CT from April 2012.  This lesion is very subtle, but visible on a chest CT from 10/20/2010. The differential visualization is almost certainly attributable to slight variations in bolus timing.  Given the tiny size and the fact that it was visible on a study from nearly 2 years ago, it is unlikely to represent  a metastatic deposit.  Continued attention to this area on follow-up imaging is recommended.  Sigmoid diverticulosis without diverticulitis.  Original Report Authenticated By: ERIC A. MANSELL, M.D.   Nm Myocar Multi W/spect W/wall Motion / Ef  05/29/2012  *RADIOLOGY REPORT*  Clinical Data:  The 66 year old female with chest pain.  History of nonobstructive coronary artery disease.  MYOCARDIAL IMAGING WITH SPECT (REST AND PHARMACOLOGIC-STRESS) GATED LEFT VENTRICULAR WALL MOTION STUDY LEFT VENTRICULAR EJECTION FRACTION  Technique:  Resting myocardial SPECT imaging was initially performed after intravenous administration of radiopharmaceutical. Myocardial SPECT was subsequently performed after additional radiopharmaceutical injection during pharmacologic-stress supervised by the Cardiology staff.  Quantitative gated imaging was also performed to evaluate left ventricular wall motion, and estimate left ventricular ejection fraction.  Radiopharmaceutical:  Tc-46m Myoview at rest and during stress.  Comparison: none  Findings:  Technique: Study is adequate.  Perfusion:  There are decreased counts within the mid and apical segment of the septal wall which are fixed on stress and rest.  No decreased counts to suggest reversible ischemia.  Wall motion:  Septal hypokinesia.  Left ventricular ejection fraction:  Calculated left ventricular ejection fraction =  greater than 70%  IMPRESSION:  1.  No reversible ischemia. 2.  Septal infarct with hypokinesia.  3.  Left ventricular ejection fraction equal greater than 70%  Original Report Authenticated By: Genevive Bi, M.D.    Microbiology: No results found for this or any previous visit (from the past 240 hour(s)).   Labs: Basic Metabolic Panel:  Lab 05/30/12 9147 05/28/12 0640 05/27/12 0305 05/26/12 2337  NA 140 136 139 140  K 3.9 4.0 3.8 3.8  CL 102 103 104 103  CO2 29 25 26 26   GLUCOSE 151* 171* 94 96  BUN 10 9 10 11   CREATININE 0.74 0.63 0.71  0.67  CALCIUM 9.5 9.3 9.0 9.4  MG -- -- -- --  PHOS -- -- -- --   Liver Function Tests:  Lab 05/28/12 1819  AST 22  ALT 16  ALKPHOS 87  BILITOT 0.3  PROT 7.1  ALBUMIN 3.6    Lab 05/28/12 1819  LIPASE 21  AMYLASE --   No results found for this basename: AMMONIA:5 in the last 168 hours CBC:  Lab 05/30/12 0530 05/28/12 0640 05/27/12 0305 05/26/12 2337  WBC 5.8 5.2 6.3 6.5  NEUTROABS -- -- -- --  HGB 13.6 13.3 12.6 13.4  HCT 40.7 39.4  37.1 39.0  MCV 90.6 88.9 89.6 89.9  PLT 154 162 149* 166   Cardiac Enzymes:  Lab 05/27/12 1833 05/27/12 1157 05/27/12 0305  CKTOTAL 108 118 122  CKMB 2.6 2.7 2.3  CKMBINDEX -- -- --  TROPONINI <0.30 <0.30 <0.30   BNP: BNP (last 3 results)  Basename 06/19/11 1724  PROBNP 45.4   CBG:  Lab 05/30/12 1347 05/30/12 1141 05/30/12 0539 05/29/12 2100 05/29/12 1612  GLUCAP 123* 188* 159* 174* 167*    Time coordinating discharge: 25 minutes  Signed:  Jeanclaude Wentworth A  [facility] 05/30/2012, 2:10 PM

## 2012-05-30 NOTE — Progress Notes (Signed)
Subjective:  History carefully reviewed.  No chest pain today.  She says she has had symptoms for the past two months.  They also can be associated with exertion  (Class II).  It lasts about a minute.  It is central and substernal.  She is better since the vomiting episode.  Nuclear data also reviewed.    Objective:  Vital Signs in the last 24 hours: Temp:  [97.5 F (36.4 C)-98.2 F (36.8 C)] 97.9 F (36.6 C) (08/07 0431) Pulse Rate:  [76-122] 76  (08/07 0431) Resp:  [18-20] 20  (08/07 0431) BP: (100-152)/(64-78) 110/64 mmHg (08/07 0431) SpO2:  [90 %-94 %] 90 % (08/07 0431)  Intake/Output from previous day: 08/06 0701 - 08/07 0700 In: 960 [P.O.:960] Out: 650 [Urine:650]   Physical Exam: General: Well developed, well nourished, in no acute distress. Head:  Normocephalic and atraumatic. Lungs: Clear to auscultation and percussion. Heart: Normal S1 and S2.  No murmur, rubs or gallops.  Pulses: Pulses normal in all 4 extremities. Extremities: No clubbing or cyanosis. No edema. Neurologic: Alert and oriented x 3.    Lab Results:  Basename 05/30/12 0530 05/28/12 0640  WBC 5.8 5.2  HGB 13.6 13.3  PLT 154 162    Basename 05/30/12 0530 05/28/12 0640  NA 140 136  K 3.9 4.0  CL 102 103  CO2 29 25  GLUCOSE 151* 171*  BUN 10 9  CREATININE 0.74 0.63    Basename 05/27/12 1833 05/27/12 1157  TROPONINI <0.30 <0.30   Hepatic Function Panel  Basename 05/28/12 1819  PROT 7.1  ALBUMIN 3.6  AST 22  ALT 16  ALKPHOS 87  BILITOT 0.3  BILIDIR <0.1  IBILI NOT CALCULATED   No results found for this basename: CHOL in the last 72 hours No results found for this basename: PROTIME in the last 72 hours  Imaging: Ct Abdomen Pelvis W Contrast  05/28/2012  *RADIOLOGY REPORT*  Clinical Data: Nausea and vomiting. Lung cancer  CT ABDOMEN AND PELVIS WITH CONTRAST  Technique:  Multidetector CT imaging of the abdomen and pelvis was performed following the standard protocol during bolus  administration of intravenous contrast.  Contrast: OMNIPAQUE IOHEXOL 300 MG/ML  SOLN  Comparison: 10/02/2011 and 03/30/2010 and 09/16/2005  Findings: 7 mm enhancing focus is identified in the anterior hepatic dome (image 10).  Fatty infiltration of the liver is evident.  The spleen, stomach, duodenum, pancreas, and adrenal glands are unremarkable.  The gallbladder is surgically absent. Tiny cortical defects in the left kidney are too small to characterize but are unchanged likely represent tiny cortical cyst. The right kidney is unremarkable.  No abdominal aortic aneurysm.  There is no free fluid or lymphadenopathy in the abdomen.  Abdominal bowel loops are unremarkable.  Imaging through the pelvis shows no free intraperitoneal fluid. There is no pelvic sidewall lymphadenopathy.  The bladder is unremarkable.  Uterus is surgically absent.  No adnexal mass.  Diverticular changes are seen in the sigmoid colon without diverticulitis.  The terminal ileum and the appendix are normal.  Bone windows reveal no worrisome lytic or sclerotic osseous lesions.  IMPRESSION: No acute findings.  No findings to explain the patient's history of nausea and vomiting.  7 mm enhancing focus in the anterior hepatic dome. While this has not been visualized on any of the recent abdominal CTs, it has a similar appearance to a chest CT from April 2013 and was also visualized on a chest CT from April 2012.  This lesion is  very subtle, but visible on a chest CT from 10/20/2010. The differential visualization is almost certainly attributable to slight variations in bolus timing.  Given the tiny size and the fact that it was visible on a study from nearly 2 years ago, it is unlikely to represent a metastatic deposit.  Continued attention to this area on follow-up imaging is recommended.  Sigmoid diverticulosis without diverticulitis.  Original Report Authenticated By: ERIC A. MANSELL, M.D.   Nm Myocar Multi W/spect W/wall Motion /  Ef  05/29/2012  *RADIOLOGY REPORT*  Clinical Data:  The 66 year old female with chest pain.  History of nonobstructive coronary artery disease.  MYOCARDIAL IMAGING WITH SPECT (REST AND PHARMACOLOGIC-STRESS) GATED LEFT VENTRICULAR WALL MOTION STUDY LEFT VENTRICULAR EJECTION FRACTION  Technique:  Resting myocardial SPECT imaging was initially performed after intravenous administration of radiopharmaceutical. Myocardial SPECT was subsequently performed after additional radiopharmaceutical injection during pharmacologic-stress supervised by the Cardiology staff.  Quantitative gated imaging was also performed to evaluate left ventricular wall motion, and estimate left ventricular ejection fraction.  Radiopharmaceutical:  Tc-65m Myoview at rest and during stress.  Comparison: none  Findings:  Technique: Study is adequate.  Perfusion:  There are decreased counts within the mid and apical segment of the septal wall which are fixed on stress and rest.  No decreased counts to suggest reversible ischemia.  Wall motion:  Septal hypokinesia.  Left ventricular ejection fraction:  Calculated left ventricular ejection fraction =  greater than 70%  IMPRESSION:  1.  No reversible ischemia. 2.  Septal infarct with hypokinesia.  3.  Left ventricular ejection fraction equal greater than 70%  Original Report Authenticated By: Genevive Bi, M.D.    EKG:  ECG read as anterior MI, age indeterminate.  There are Qs through lead V3  Nuclear images: Perfusion: There are decreased counts within the mid and apical  segment of the septal wall which are fixed on stress and rest. No  decreased counts to suggest reversible ischemia.  Wall motion: Septal hypokinesia.  Left ventricular ejection fraction: Calculated left ventricular  ejection fraction = greater than 70%  IMPRESSION:  1. No reversible ischemia.  2. Septal infarct with hypokinesia.  3. Left ventricular ejection fraction equal greater than 70%  Original  Report Authenticated By: Genevive Bi, M.D.      Assessment/Plan:  Patient Active Hospital Problem List: Atypical chest pain (05/27/2012)   Assessment: symptoms and risk factors make likelihood of CAD higher than I would be comfortable with sitting on.  Importantly, a normal wall motion with normal EF, with "infarct", is a severe ischemia equivalent per se----   Plan: I have discussed with her the options.  This eventually will need to be resolved in light of abnormality.  Her scan was abnormal as well in the past, so we do not have a good alternative except for possibly stress dobutamine.  I think this would provide definitive data and she is agreeable.   CARCINOMA, LUNG, SMALL CELL (01/18/2009)   Assessment: see CT scan   Plan: followed by oncology   She has been cathed before.  Risks explained and she is agreeable.   Will begin hydration.     Shawnie Pons, MD, [facility], FSCAI 05/30/2012, 9:22 AM

## 2012-05-30 NOTE — Progress Notes (Signed)
Subjective: Denies any chest pain or shortness of breath.  Patient seen after cardiac catheterization.  Objective: Vital signs in last 24 hours: Filed Vitals:   05/29/12 2058 05/30/12 0431 05/30/12 1152 05/30/12 1230  BP: 100/69 110/64 93/55   Pulse: 86 76 79 71  Temp: 98.2 F (36.8 C) 97.9 F (36.6 C) 98.6 F (37 C)   TempSrc: Oral Oral Oral   Resp: 18 20 20    Height:      Weight:      SpO2: 92% 90% 95%    Weight change:   Intake/Output Summary (Last 24 hours) at 05/30/12 1401 Last data filed at 05/30/12 0700  Gross per 24 hour  Intake    720 ml  Output      0 ml  Net    720 ml    Physical Exam: General: Awake, Oriented, No acute distress. HEENT: EOMI. Neck: Supple CV: S1 and S2 Lungs: Clear to ascultation bilaterally Abdomen: Soft, Nontender, Nondistended, +bowel sounds. Ext: Good pulses. Trace edema.  Lab Results: Basic Metabolic Panel:  Lab 05/30/12 1610 05/28/12 0640 05/27/12 0305 05/26/12 2337  NA 140 136 139 140  K 3.9 4.0 3.8 3.8  CL 102 103 104 103  CO2 29 25 26 26   GLUCOSE 151* 171* 94 96  BUN 10 9 10 11   CREATININE 0.74 0.63 0.71 0.67  CALCIUM 9.5 9.3 9.0 9.4  MG -- -- -- --  PHOS -- -- -- --   Liver Function Tests:  Lab 05/28/12 1819  AST 22  ALT 16  ALKPHOS 87  BILITOT 0.3  PROT 7.1  ALBUMIN 3.6    Lab 05/28/12 1819  LIPASE 21  AMYLASE --   No results found for this basename: AMMONIA:5 in the last 168 hours CBC:  Lab 05/30/12 0530 05/28/12 0640 05/27/12 0305 05/26/12 2337  WBC 5.8 5.2 6.3 6.5  NEUTROABS -- -- -- --  HGB 13.6 13.3 12.6 13.4  HCT 40.7 39.4 37.1 39.0  MCV 90.6 88.9 89.6 89.9  PLT 154 162 149* 166   Cardiac Enzymes:  Lab 05/27/12 1833 05/27/12 1157 05/27/12 0305  CKTOTAL 108 118 122  CKMB 2.6 2.7 2.3  CKMBINDEX -- -- --  TROPONINI <0.30 <0.30 <0.30   BNP (last 3 results)  Basename 06/19/11 1724  PROBNP 45.4   CBG:  Lab 05/30/12 1347 05/30/12 1141 05/30/12 0539 05/29/12 2100 05/29/12 1612  GLUCAP  123* 188* 159* 174* 167*    Basename 05/28/12 1646  HGBA1C 7.1*   Other Labs: No components found with this basename: POCBNP:3 No results found for this basename: DDIMER:2 in the last 168 hours No results found for this basename: CHOL:2,HDL:2,LDLCALC:2,TRIG:2,CHOLHDL:2,LDLDIRECT:2 in the last 168 hours No results found for this basename: TSH,T4TOTAL,FREET3,T3FREE,FREET4,THYROIDAB in the last 168 hours No results found for this basename: VITAMINB12:2,FOLATE:2,FERRITIN:2,TIBC:2,IRON:2,RETICCTPCT:2 in the last 168 hours  Micro Results: No results found for this or any previous visit (from the past 240 hour(s)).  Studies/Results: Ct Abdomen Pelvis W Contrast  05/28/2012  *RADIOLOGY REPORT*  Clinical Data: Nausea and vomiting. Lung cancer  CT ABDOMEN AND PELVIS WITH CONTRAST  Technique:  Multidetector CT imaging of the abdomen and pelvis was performed following the standard protocol during bolus administration of intravenous contrast.  Contrast: OMNIPAQUE IOHEXOL 300 MG/ML  SOLN  Comparison: 10/02/2011 and 03/30/2010 and 09/16/2005  Findings: 7 mm enhancing focus is identified in the anterior hepatic dome (image 10).  Fatty infiltration of the liver is evident.  The spleen, stomach, duodenum, pancreas, and  adrenal glands are unremarkable.  The gallbladder is surgically absent. Tiny cortical defects in the left kidney are too small to characterize but are unchanged likely represent tiny cortical cyst. The right kidney is unremarkable.  No abdominal aortic aneurysm.  There is no free fluid or lymphadenopathy in the abdomen.  Abdominal bowel loops are unremarkable.  Imaging through the pelvis shows no free intraperitoneal fluid. There is no pelvic sidewall lymphadenopathy.  The bladder is unremarkable.  Uterus is surgically absent.  No adnexal mass.  Diverticular changes are seen in the sigmoid colon without diverticulitis.  The terminal ileum and the appendix are normal.  Bone windows reveal no  worrisome lytic or sclerotic osseous lesions.  IMPRESSION: No acute findings.  No findings to explain the patient's history of nausea and vomiting.  7 mm enhancing focus in the anterior hepatic dome. While this has not been visualized on any of the recent abdominal CTs, it has a similar appearance to a chest CT from April 2013 and was also visualized on a chest CT from April 2012.  This lesion is very subtle, but visible on a chest CT from 10/20/2010. The differential visualization is almost certainly attributable to slight variations in bolus timing.  Given the tiny size and the fact that it was visible on a study from nearly 2 years ago, it is unlikely to represent a metastatic deposit.  Continued attention to this area on follow-up imaging is recommended.  Sigmoid diverticulosis without diverticulitis.  Original Report Authenticated By: ERIC A. MANSELL, M.D.   Nm Myocar Multi W/spect W/wall Motion / Ef  05/29/2012  *RADIOLOGY REPORT*  Clinical Data:  The 66 year old female with chest pain.  History of nonobstructive coronary artery disease.  MYOCARDIAL IMAGING WITH SPECT (REST AND PHARMACOLOGIC-STRESS) GATED LEFT VENTRICULAR WALL MOTION STUDY LEFT VENTRICULAR EJECTION FRACTION  Technique:  Resting myocardial SPECT imaging was initially performed after intravenous administration of radiopharmaceutical. Myocardial SPECT was subsequently performed after additional radiopharmaceutical injection during pharmacologic-stress supervised by the Cardiology staff.  Quantitative gated imaging was also performed to evaluate left ventricular wall motion, and estimate left ventricular ejection fraction.  Radiopharmaceutical:  Tc-42m Myoview at rest and during stress.  Comparison: none  Findings:  Technique: Study is adequate.  Perfusion:  There are decreased counts within the mid and apical segment of the septal wall which are fixed on stress and rest.  No decreased counts to suggest reversible ischemia.  Wall  motion:  Septal hypokinesia.  Left ventricular ejection fraction:  Calculated left ventricular ejection fraction =  greater than 70%  IMPRESSION:  1.  No reversible ischemia. 2.  Septal infarct with hypokinesia.  3.  Left ventricular ejection fraction equal greater than 70%  Original Report Authenticated By: Genevive Bi, M.D.    Medications: I have reviewed the patient's current medications. Scheduled Meds:   . aspirin EC  81 mg Oral Daily  . fentaNYL      . heparin      . heparin      . insulin aspart  0-15 Units Subcutaneous TID WC  . insulin glargine  12 Units Subcutaneous QHS  . lidocaine      . loratadine  10 mg Oral Daily  . midazolam      . nitroGLYCERIN      . ondansetron      . pantoprazole  40 mg Oral Q1200  . regadenoson      . simvastatin  20 mg Oral QHS  . sodium chloride  3  mL Intravenous Q12H  . tiotropium  18 mcg Inhalation Daily  . verapamil      . DISCONTD: insulin glargine  10 Units Subcutaneous QHS   Continuous Infusions:   . sodium chloride 75 mL/hr at 05/30/12 1352  . DISCONTD: sodium chloride Stopped (05/28/12 1115)  . DISCONTD: sodium chloride     PRN Meds:.sodium chloride, acetaminophen, acetaminophen, albuterol, alum & mag hydroxide-simeth, HYDROmorphone (DILAUDID) injection, nitroGLYCERIN, nitroGLYCERIN, ondansetron (ZOFRAN) IV, ondansetron, oxyCODONE, sodium chloride, zolpidem  Assessment/Plan: Atypical chest pain  Patient was initially admitted to telemetry unit, cardiac enzymes were cycled and was ruled out for acute coronary syndrome.  Patient was to have been discharged on 05/29/2012 with outpatient followup for stress test, however patient up with vomiting, cardiology was asked to evaluate the patient while the patient was inpatient.  Cardiology performed cardiac catheter on 05/30/2012 which showed mild, nonobstructive coronary disease, normal LV systolic function.  Cardiology recommended having the patient follow up with Dr. [name] in few  weeks.  CARCINOMA, LUNG, SMALL CELL  Stable   Diabetes type 2 with complications Lantus plus sliding scale insulin.  Stable.  HYPERLIPIDEMIA  Statin medication   COPD (chronic obstructive pulmonary disease)  Continues Spiriva and when necessary albuterol   Vomiting  No further episodes. CT scan of the abdomen and pelvis negative.  Disposition Discharge the patient today.   LOS: 3 days  Eberardo Demello A, MD 05/30/2012, 2:01 PM

## 2012-05-31 ENCOUNTER — Telehealth: Payer: Self-pay | Admitting: Internal Medicine

## 2012-05-31 NOTE — Progress Notes (Signed)
Subjective: Not entirely sure why the patient was not discharged yesterday. Had some bleeding after cath, which was controlled since yesterday. Patient reported that she has not further questions or concerns. Understands the followup that she needs to arrange.  Objective: Vital signs in last 24 hours: Filed Vitals:   05/30/12 1716 05/30/12 1955 05/31/12 0524 05/31/12 0747  BP: 137/83 118/76 109/69   Pulse: 86 97 83 95  Temp: 98 F (36.7 C) 97.6 F (36.4 C) 98.2 F (36.8 C)   TempSrc: Oral Oral Oral   Resp:  20 18 18   Height:      Weight:      SpO2: 96% 94% 92% 96%   Weight change:   Intake/Output Summary (Last 24 hours) at 05/31/12 0907 Last data filed at 05/31/12 0849  Gross per 24 hour  Intake    240 ml  Output      0 ml  Net    240 ml    Physical Exam: General: Awake, Oriented, No acute distress. Ext: 2x3 cm bruising over right radial site, bandage in place..  Lab Results: Basic Metabolic Panel:  Lab 05/30/12 1610 05/28/12 0640 05/27/12 0305 05/26/12 2337  NA 140 136 139 140  K 3.9 4.0 3.8 3.8  CL 102 103 104 103  CO2 29 25 26 26   GLUCOSE 151* 171* 94 96  BUN 10 9 10 11   CREATININE 0.74 0.63 0.71 0.67  CALCIUM 9.5 9.3 9.0 9.4  MG -- -- -- --  PHOS -- -- -- --   Liver Function Tests:  Lab 05/28/12 1819  AST 22  ALT 16  ALKPHOS 87  BILITOT 0.3  PROT 7.1  ALBUMIN 3.6    Lab 05/28/12 1819  LIPASE 21  AMYLASE --   No results found for this basename: AMMONIA:5 in the last 168 hours CBC:  Lab 05/30/12 0530 05/28/12 0640 05/27/12 0305 05/26/12 2337  WBC 5.8 5.2 6.3 6.5  NEUTROABS -- -- -- --  HGB 13.6 13.3 12.6 13.4  HCT 40.7 39.4 37.1 39.0  MCV 90.6 88.9 89.6 89.9  PLT 154 162 149* 166   Cardiac Enzymes:  Lab 05/27/12 1833 05/27/12 1157 05/27/12 0305  CKTOTAL 108 118 122  CKMB 2.6 2.7 2.3  CKMBINDEX -- -- --  TROPONINI <0.30 <0.30 <0.30   BNP (last 3 results)  Basename 06/19/11 1724  PROBNP 45.4   CBG:  Lab 05/31/12 0601 05/30/12  2057 05/30/12 1648 05/30/12 1347 05/30/12 1141  GLUCAP 139* 243* 129* 123* 188*    Basename 05/28/12 1646  HGBA1C 7.1*   Other Labs: No components found with this basename: POCBNP:3 No results found for this basename: DDIMER:2 in the last 168 hours No results found for this basename: CHOL:2,HDL:2,LDLCALC:2,TRIG:2,CHOLHDL:2,LDLDIRECT:2 in the last 168 hours No results found for this basename: TSH,T4TOTAL,FREET3,T3FREE,FREET4,THYROIDAB in the last 168 hours No results found for this basename: VITAMINB12:2,FOLATE:2,FERRITIN:2,TIBC:2,IRON:2,RETICCTPCT:2 in the last 168 hours  Micro Results: No results found for this or any previous visit (from the past 240 hour(s)).  Studies/Results: Nm Myocar Multi W/spect W/wall Motion / Ef  05/29/2012  *RADIOLOGY REPORT*  Clinical Data:  The 66 year old female with chest pain.  History of nonobstructive coronary artery disease.  MYOCARDIAL IMAGING WITH SPECT (REST AND PHARMACOLOGIC-STRESS) GATED LEFT VENTRICULAR WALL MOTION STUDY LEFT VENTRICULAR EJECTION FRACTION  Technique:  Resting myocardial SPECT imaging was initially performed after intravenous administration of radiopharmaceutical. Myocardial SPECT was subsequently performed after additional radiopharmaceutical injection during pharmacologic-stress supervised by the Cardiology staff.  Quantitative gated imaging  was also performed to evaluate left ventricular wall motion, and estimate left ventricular ejection fraction.  Radiopharmaceutical:  Tc-89m Myoview at rest and during stress.  Comparison: none  Findings:  Technique: Study is adequate.  Perfusion:  There are decreased counts within the mid and apical segment of the septal wall which are fixed on stress and rest.  No decreased counts to suggest reversible ischemia.  Wall motion:  Septal hypokinesia.  Left ventricular ejection fraction:  Calculated left ventricular ejection fraction =  greater than 70%  IMPRESSION:  1.  No reversible ischemia.  2.  Septal infarct with hypokinesia.  3.  Left ventricular ejection fraction equal greater than 70%  Original Report Authenticated By: Genevive Bi, M.D.    Medications: I have reviewed the patient's current medications. Scheduled Meds:    . aspirin EC  81 mg Oral Daily  . fentaNYL      . heparin      . heparin      . insulin aspart  0-15 Units Subcutaneous TID WC  . insulin glargine  12 Units Subcutaneous QHS  . lidocaine      . loratadine  10 mg Oral Daily  . midazolam      . nitroGLYCERIN      . pantoprazole  40 mg Oral Q1200  . simvastatin  20 mg Oral QHS  . tiotropium  18 mcg Inhalation Daily  . verapamil      . DISCONTD: sodium chloride  3 mL Intravenous Q12H   Continuous Infusions:    . sodium chloride 75 mL/hr at 05/30/12 1352  . DISCONTD: sodium chloride Stopped (05/28/12 1115)  . DISCONTD: sodium chloride     PRN Meds:.acetaminophen, acetaminophen, albuterol, alum & mag hydroxide-simeth, HYDROmorphone (DILAUDID) injection, nitroGLYCERIN, nitroGLYCERIN, ondansetron (ZOFRAN) IV, ondansetron, oxyCODONE, zolpidem, DISCONTD: sodium chloride, DISCONTD: sodium chloride  Assessment/Plan: Atypical chest pain  Had cath yesterday. Patient follow up with Dr. [name] in few weeks.  CARCINOMA, LUNG, SMALL CELL  Stable   Diabetes type 2 with complications Lantus plus sliding scale insulin.  Stable.  HYPERLIPIDEMIA  Statin medication   COPD (chronic obstructive pulmonary disease)  Continue Spiriva and when necessary albuterol   Vomiting  No further episodes.  Disposition Discharge the patient today. Otherwise plan unchanged from yesterday.   LOS: 4 days  Tish Begin A, MD 05/31/2012, 9:07 AM

## 2012-05-31 NOTE — Telephone Encounter (Signed)
Pt needs post hos fup within 1 wk. Where can I sch?

## 2012-05-31 NOTE — Progress Notes (Signed)
Received a call from physician about discharge orders, Pt was not discharged to home post cath. Family still had questions about procedure, In formed physician Pt will be discharged in the morning.

## 2012-05-31 NOTE — Telephone Encounter (Signed)
Any days you want to start at 7:45?

## 2012-06-01 NOTE — Telephone Encounter (Signed)
Wednesday at 8:30

## 2012-06-01 NOTE — Telephone Encounter (Signed)
Pt is aware of appt time.

## 2012-06-06 ENCOUNTER — Ambulatory Visit (INDEPENDENT_AMBULATORY_CARE_PROVIDER_SITE_OTHER): Payer: Medicare Other | Admitting: Internal Medicine

## 2012-06-06 ENCOUNTER — Encounter: Payer: Self-pay | Admitting: Internal Medicine

## 2012-06-06 VITALS — BP 102/70 | HR 80 | Temp 97.8°F | Wt 176.0 lb

## 2012-06-06 DIAGNOSIS — I2510 Atherosclerotic heart disease of native coronary artery without angina pectoris: Secondary | ICD-10-CM | POA: Insufficient documentation

## 2012-06-09 NOTE — Progress Notes (Signed)
Patient ID: [name], female   DOB: November 18, 1945, 66 y.o.   MRN: [id number] Atypical chest pain-- see hospital notes and studies She is now feeling well No complaints  Past Medical History  Diagnosis Date  . Diabetes mellitus   . GERD (gastroesophageal reflux disease)   . Lung cancer     non-small cell lung ca, stage III in 05/2011, s/p chemo/radiation  . COPD (chronic obstructive pulmonary disease)   . History of diverticulitis of colon   . CAD (coronary artery disease)     NON obstructive. Cath 2006 preserved LV fxn, scattered irregularities without critical stenosis. 2008 stress echo negative for ischemia, but with hypertensive response  . Hyperlipidemia   . Cancer of lung 12/24/2008    treated with radiation and chemotherapy    History   Social History  . Marital Status: Married    Spouse Name: N/A    Number of Children: N/A  . Years of Education: N/A   Occupational History  . Retired Engineer, petroleum    Social History Main Topics  . Smoking status: Former Smoker -- 1.0 packs/day for 45 years    Types: Cigarettes    Quit date: 10/24/2008  . Smokeless tobacco: Not on file  . Alcohol Use: No  . Drug Use: No  . Sexually Active: Not on file   Other Topics Concern  . Not on file   Social History Narrative   Lives in [place], has husband and daughter and son. Daughter's family is living with her and husband at present. She is ambulatory daily without cane or walker.     Past Surgical History  Procedure Date  . Appendectomy   . Abdominal hysterectomy 1997  . Carpal tunnel release     Family History  Problem Relation Age of Onset  . Heart failure Mother   . Kidney disease Mother     renal failure  . Diabetes Mother   . Diabetes Father   . Diabetes Brother   . Heart disease Brother   . Heart attack Brother   . Heart attack Brother   . Heart attack Brother     Allergies  Allergen Reactions  . Lactose Intolerance (Gi) Diarrhea  . Ibuprofen    REACTION: Anxiousness, hypoventilate     Current Outpatient Prescriptions on File Prior to Visit  Medication Sig Dispense Refill  . albuterol (PROVENTIL HFA;VENTOLIN HFA) 108 (90 BASE) MCG/ACT inhaler Inhale 2 puffs into the lungs every 6 (six) hours as needed for wheezing or shortness of breath. For shortness of breath  1 Inhaler  0  . aspirin 81 MG tablet Take 81 mg by mouth daily.      . insulin NPH-insulin regular (NOVOLIN 70/30) (70-30) 100 UNIT/ML injection 25 units in a.m. And 22 units in the evening. Take with meals  60 mL  12  . loratadine (CLARITIN) 10 MG tablet Take 10 mg by mouth daily.        . nitroGLYCERIN (NITROSTAT) 0.4 MG SL tablet Place 0.4 mg under the tongue every 5 (five) minutes as needed. For chest pain      . simvastatin (ZOCOR) 10 MG tablet Take 10 mg by mouth at bedtime.      Marland Kitchen tiotropium (SPIRIVA) 18 MCG inhalation capsule Place 18 mcg into inhaler and inhale daily as needed. For shortness of breath         patient denies chest pain, shortness of breath, orthopnea. Denies lower extremity edema, abdominal pain, change in appetite, change in  bowel movements. Patient denies rashes, musculoskeletal complaints. No other specific complaints in a complete review of systems.   BP 102/70  Pulse 80  Temp 97.8 F (36.6 C) (Oral)  Wt 176 lb (79.833 kg)  Well-developed well-nourished female in no acute distress. HEENT exam atraumatic, normocephalic, extraocular muscles are intact. Neck is supple. No jugular venous distention no thyromegaly. Chest clear to auscultation without increased work of breathing. Cardiac exam S1 and S2 are regular. Abdominal exam active bowel sounds, soft, nontender. Extremities no edema. Neurologic exam she is alert without any motor sensory deficits. Gait is normal.   Atypical chest pain- resolved No further eval necessary  She has reular f/u with me in one month

## 2012-06-19 IMAGING — CT CT CHEST W/ CM
2 of 3 series · 15 of 36 positions shown, 18 images · IV contrast (80ML OMNI 300)
Comparison: CT 06/19/2011

CLINICAL DATA: Lung cancer diagnosed 6808, chemotherapy and
radiation therapy complete.

CT CHEST WITH CONTRAST
TECHNIQUE: Multidetector CT imaging of the chest was performed
following the standard protocol during bolus administration of
intravenous contrast.
Contrast:  80 ml Omnipaque 300

[Series 2: chest with st · axial · 0.75mm/px · z∈[-216,+34]mm · 12 of 60 slices shown, 15 images]
[im 5/60  mediastinal]
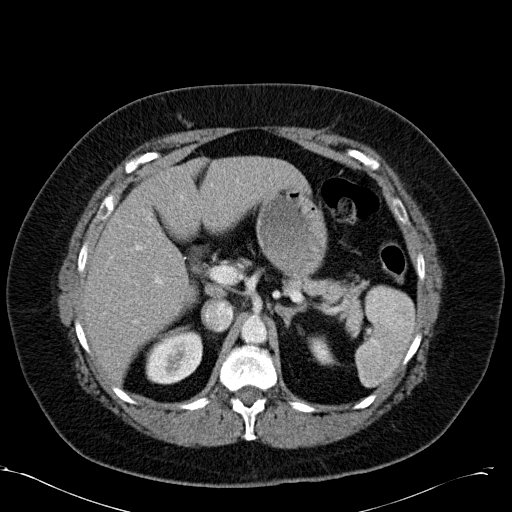
[im 5/60  lung]
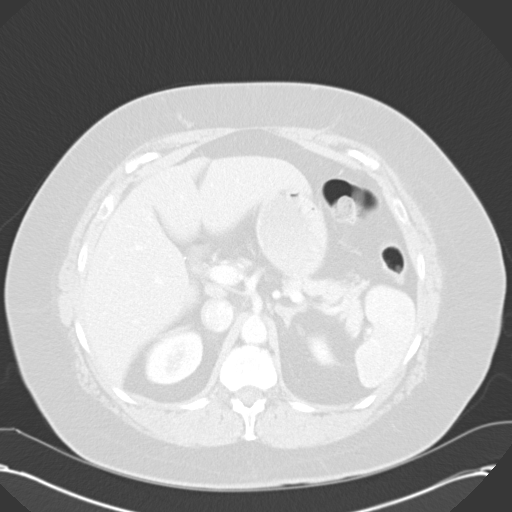
[im 9/60  lung]
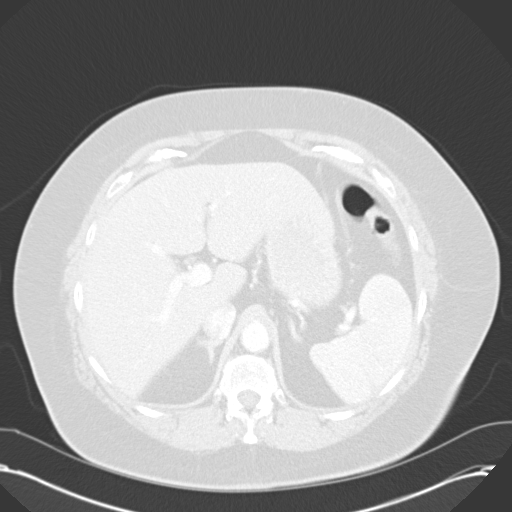
[im 14/60  lung]
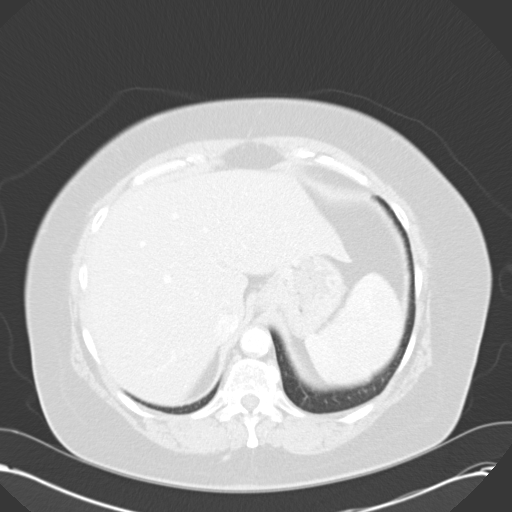
[im 18/60  lung]
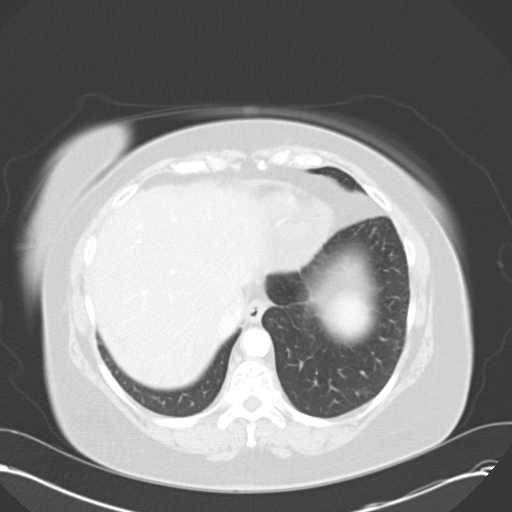
[im 22/60  mediastinal]
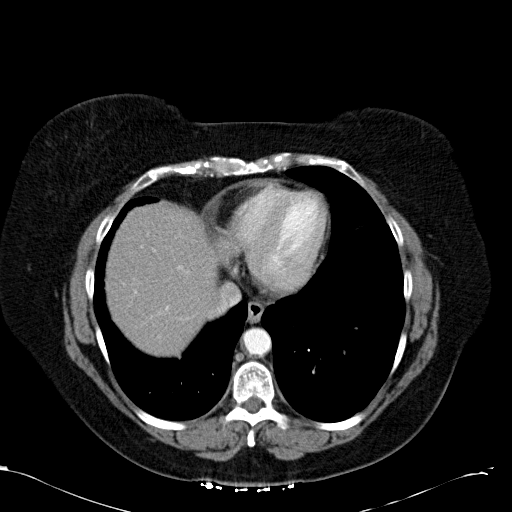
[im 22/60  lung]
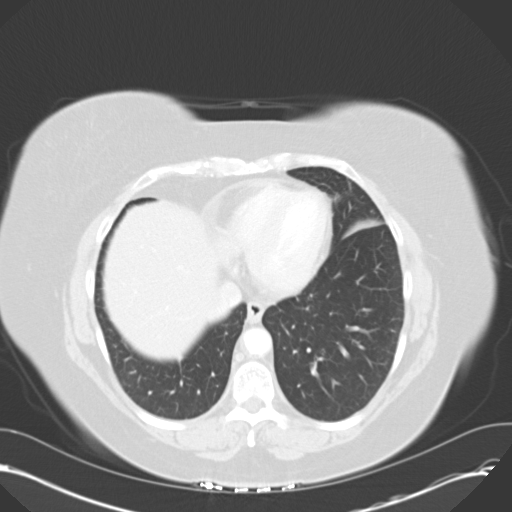
[im 27/60  lung]
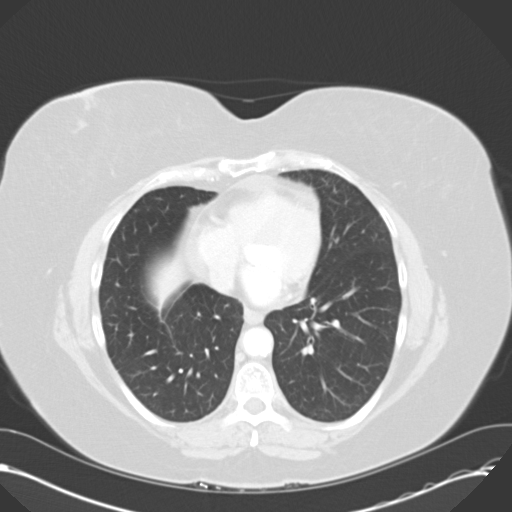
[im 33/60  lung]
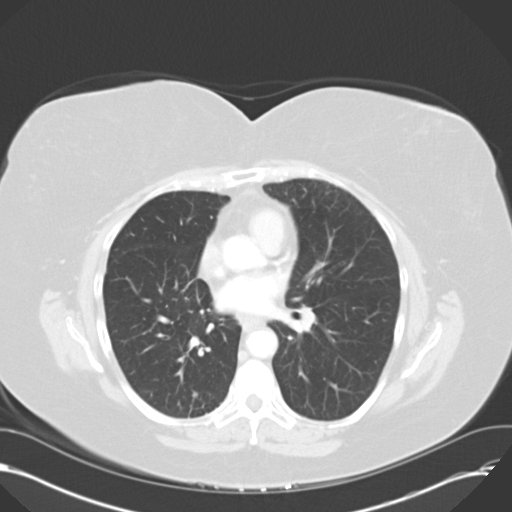
[im 38/60  lung]
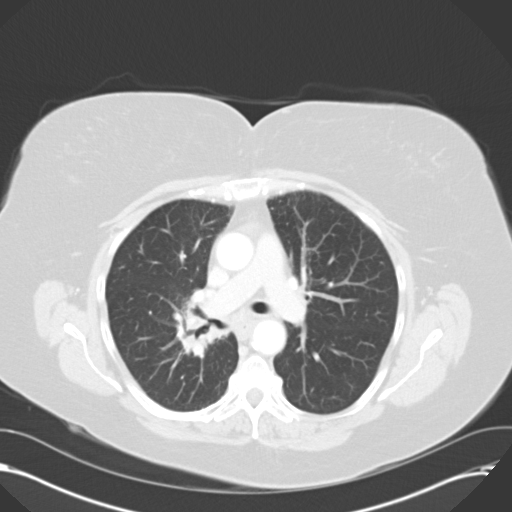
[im 42/60  mediastinal]
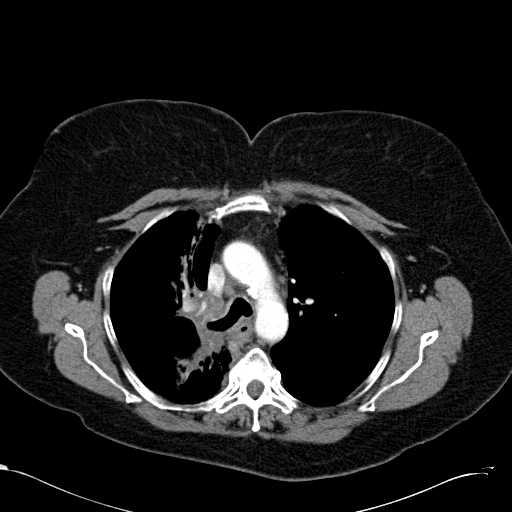
[im 42/60  lung]
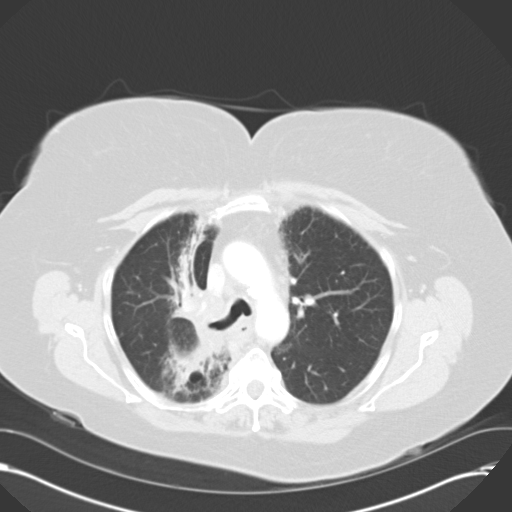
[im 46/60  lung]
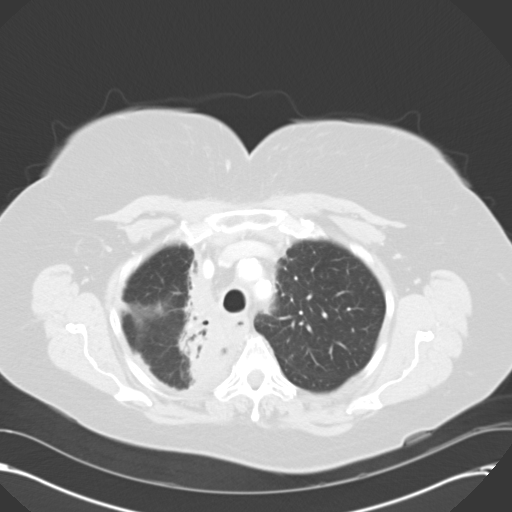
[im 51/60  lung]
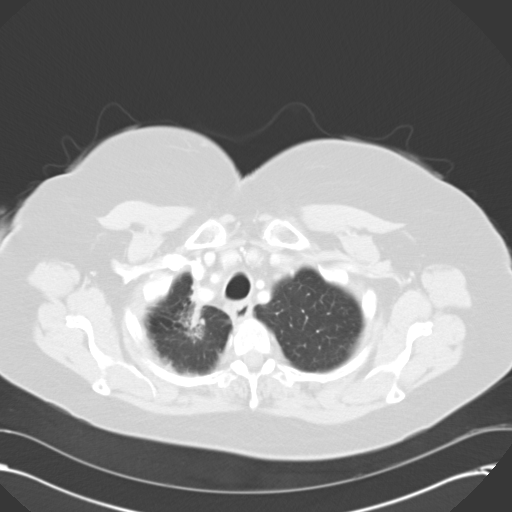
[im 55/60  lung]
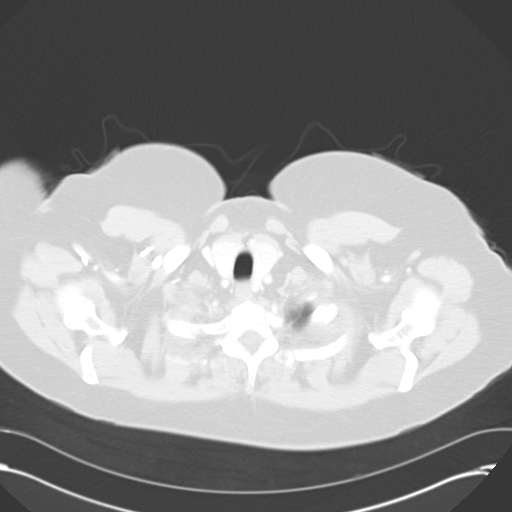

[Series 602: <mpr thick range> · coronal · 0.75mm/px · 3 of 78 slices shown]
[im 16/78  lung]
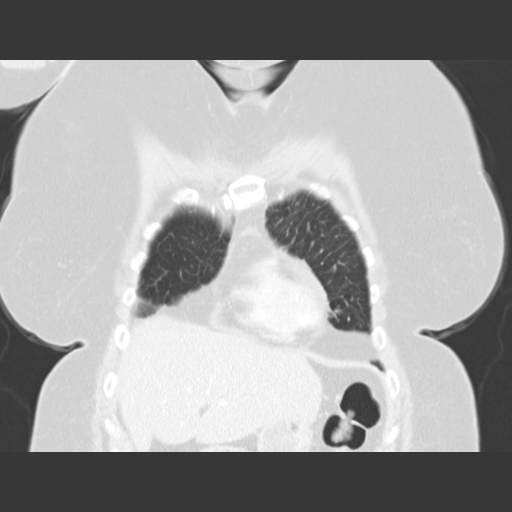
[im 31/78  lung]
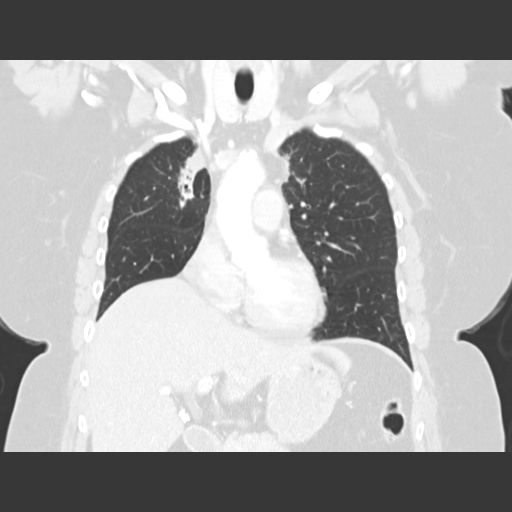
[im 47/78  lung]
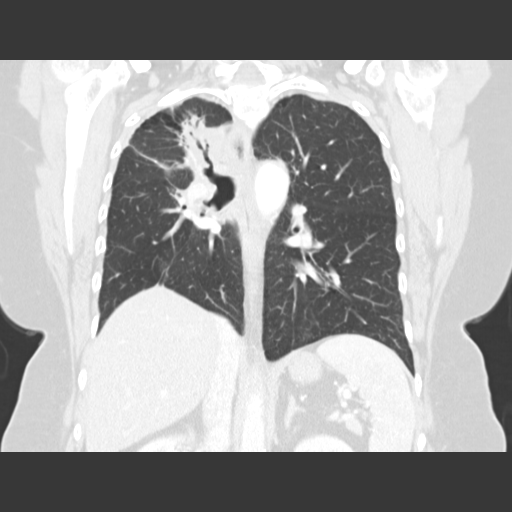

[15 of 36 positions shown; findings below may reference images not displayed]

FINDINGS: No axillary or supraclavicular lymphadenopathy.  There is
some thickening in the mediastinum posterior to the superior vena
cava measuring as 16 mm (image 19) which is unchanged from 16 mm on
prior.  No new mediastinal lymphadenopathy.  There is some
thickening of the esophagus additionally below the carina which is
unchanged.  Small pericardial fluid is similar.

Within the right upper lobe there is postsurgical change,
consolidation, and bronchiectasis. A  4 mm nodule at the most
superior aspect of the post therapy change (image 10) is s
unchanged from comparison from 02/04/2011.

Limited view of the upper abdomen demonstrates normal adrenal
glands.

Limited view of the skeleton is unremarkable.
IMPRESSION: 1.  No evidence of lung cancer recurrence.

2.  Stable postoperative change in the right upper lobe and right
mediastinum.
3.  Small pulmonary nodule along the superior aspect of the post
therapy change. Slightly more prominent.  Recommend attention on
follow-up.

## 2012-07-06 ENCOUNTER — Encounter: Payer: Self-pay | Admitting: Internal Medicine

## 2012-07-06 ENCOUNTER — Ambulatory Visit (INDEPENDENT_AMBULATORY_CARE_PROVIDER_SITE_OTHER): Payer: Medicare Other | Admitting: Internal Medicine

## 2012-07-06 VITALS — BP 116/80 | HR 84 | Temp 97.9°F | Wt 178.0 lb

## 2012-07-06 DIAGNOSIS — IMO0002 Reserved for concepts with insufficient information to code with codable children: Secondary | ICD-10-CM

## 2012-07-06 DIAGNOSIS — E118 Type 2 diabetes mellitus with unspecified complications: Secondary | ICD-10-CM

## 2012-07-06 DIAGNOSIS — I2510 Atherosclerotic heart disease of native coronary artery without angina pectoris: Secondary | ICD-10-CM

## 2012-07-06 DIAGNOSIS — Z23 Encounter for immunization: Secondary | ICD-10-CM

## 2012-07-06 DIAGNOSIS — C3490 Malignant neoplasm of unspecified part of unspecified bronchus or lung: Secondary | ICD-10-CM

## 2012-07-06 LAB — MICROALBUMIN / CREATININE URINE RATIO
Creatinine,U: 18.4 mg/dL
Microalb Creat Ratio: 0.5 mg/g (ref 0.0–30.0)
Microalb, Ur: 0.1 mg/dL (ref 0.0–1.9)

## 2012-07-06 LAB — HM DIABETES FOOT EXAM

## 2012-07-06 LAB — LIPID PANEL
Cholesterol: 173 mg/dL (ref 0–200)
VLDL: 45 mg/dL — ABNORMAL HIGH (ref 0.0–40.0)

## 2012-07-06 LAB — HEPATIC FUNCTION PANEL
ALT: 21 U/L (ref 0–35)
AST: 22 U/L (ref 0–37)
Albumin: 3.9 g/dL (ref 3.5–5.2)

## 2012-07-06 LAB — BASIC METABOLIC PANEL
BUN: 13 mg/dL (ref 6–23)
GFR: 80.2 mL/min (ref >60.00)
Glucose, Bld: 106 mg/dL — ABNORMAL HIGH (ref 70–99)
Potassium: 4.4 mEq/L (ref 3.5–5.1)

## 2012-07-06 LAB — LDL CHOLESTEROL, DIRECT: Direct LDL: 109.8 mg/dL

## 2012-07-06 NOTE — Assessment & Plan Note (Signed)
She has a f/u appt with dr Arbutus Ped in 1 month

## 2012-07-06 NOTE — Assessment & Plan Note (Signed)
She has no concerning sxs Continue risk factor modification

## 2012-07-06 NOTE — Addendum Note (Signed)
Addended by: Bonnye Fava on: 07/06/2012 09:12 AM   Modules accepted: Orders

## 2012-07-06 NOTE — Assessment & Plan Note (Signed)
Home CBGs-average around 145  check labs today

## 2012-07-06 NOTE — Progress Notes (Signed)
Patient ID: [name], female   DOB: 1946/01/03, 66 y.o.   MRN: [id number]   patient comes in for followup of multiple medical problems including type 2 diabetes, hyperlipidemia, hypertension. The patient does check blood sugars but does not check blood pressure at home. The patetient does not follow an exercise or diet program. The patient denies any polyuria, polydipsia.  In the past the patient has gone to diabetic treatment center. The patient is tolerating medications  Without difficulty. The patient does admit to medication compliance.    Past Medical History  Diagnosis Date  . Diabetes mellitus   . GERD (gastroesophageal reflux disease)   . Lung cancer     non-small cell lung ca, stage III in 05/2011, s/p chemo/radiation  . COPD (chronic obstructive pulmonary disease)   . History of diverticulitis of colon   . CAD (coronary artery disease)     NON obstructive. Cath 2006 preserved LV fxn, scattered irregularities without critical stenosis. 2008 stress echo negative for ischemia, but with hypertensive response  . Hyperlipidemia   . Cancer of lung 12/24/2008    treated with radiation and chemotherapy    History   Social History  . Marital Status: Married    Spouse Name: N/A    Number of Children: N/A  . Years of Education: N/A   Occupational History  . Retired Engineer, petroleum    Social History Main Topics  . Smoking status: Former Smoker -- 1.0 packs/day for 45 years    Types: Cigarettes    Quit date: 10/24/2008  . Smokeless tobacco: Not on file  . Alcohol Use: No  . Drug Use: No  . Sexually Active: Not on file   Other Topics Concern  . Not on file   Social History Narrative   Lives in [place], has husband and daughter and son. Daughter's family is living with her and husband at present. She is ambulatory daily without cane or walker.     Past Surgical History  Procedure Date  . Appendectomy   . Abdominal hysterectomy 1997  . Carpal tunnel release      Family History  Problem Relation Age of Onset  . Heart failure Mother   . Kidney disease Mother     renal failure  . Diabetes Mother   . Diabetes Father   . Diabetes Brother   . Heart disease Brother   . Heart attack Brother   . Heart attack Brother   . Heart attack Brother     Allergies  Allergen Reactions  . Lactose Intolerance (Gi) Diarrhea  . Ibuprofen     REACTION: Anxiousness, hypoventilate     Current Outpatient Prescriptions on File Prior to Visit  Medication Sig Dispense Refill  . aspirin 81 MG tablet Take 81 mg by mouth daily.      . insulin NPH-insulin regular (NOVOLIN 70/30) (70-30) 100 UNIT/ML injection 25 units in a.m. And 22 units in the evening. Take with meals  60 mL  12  . loratadine (CLARITIN) 10 MG tablet Take 10 mg by mouth daily.        . nitroGLYCERIN (NITROSTAT) 0.4 MG SL tablet Place 0.4 mg under the tongue every 5 (five) minutes as needed. For chest pain      . simvastatin (ZOCOR) 10 MG tablet Take 10 mg by mouth at bedtime.         patient denies chest pain, shortness of breath, orthopnea. Denies lower extremity edema, abdominal pain, change in appetite, change in bowel  movements. Patient denies rashes, musculoskeletal complaints. No other specific complaints in a complete review of systems.   BP 116/80  Pulse 84  Temp 97.9 F (36.6 C) (Oral)  Wt 178 lb (80.74 kg)  Well-developed well-nourished female in no acute distress. HEENT exam atraumatic, normocephalic, extraocular muscles are intact. Neck is supple. No jugular venous distention no thyromegaly. Chest clear to auscultation without increased work of breathing. Cardiac exam S1 and S2 are regular. Abdominal exam active bowel sounds, soft, nontender. Extremities no edema. Neurologic exam she is alert without any motor sensory deficits. Gait is normal.

## 2012-08-04 IMAGING — CT CT ABD-PELV W/O CM
2 of 4 series · 17 of 46 positions shown, 19 images · non-contrast
Comparison: 03/30/2010

CLINICAL DATA: Left lower quadrant abdominal pain, nausea, lung
cancer

CT ABDOMEN AND PELVIS WITHOUT CONTRAST
TECHNIQUE: Multidetector CT imaging of the abdomen and pelvis was
performed following the standard protocol without intravenous
contrast.

[Series 2: abd/pelv w/o 5.0 b31f st · axial · non-contrast · 0.73mm/px · z∈[-452,-12]mm · 14 of 96 slices shown, 16 images]
[im 4/96  soft-tissue]
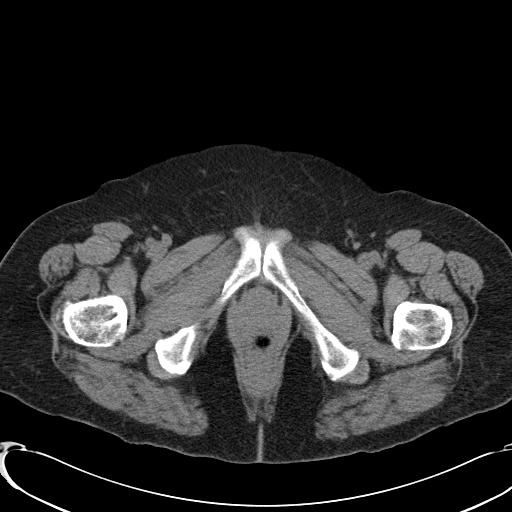
[im 4/96  bone]
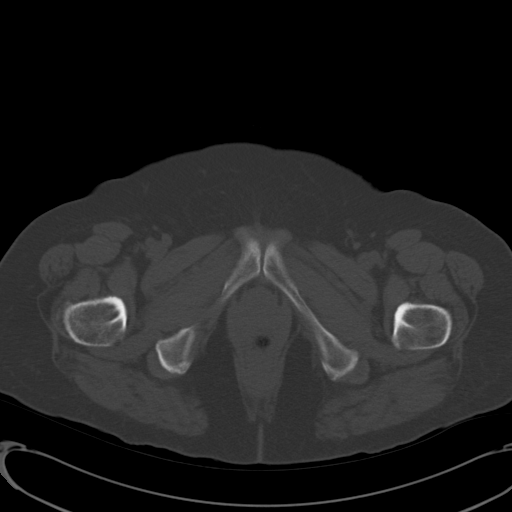
[im 12/96  soft-tissue]
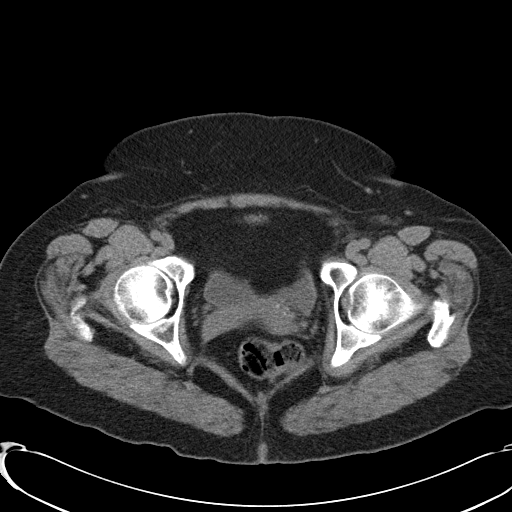
[im 20/96  soft-tissue]
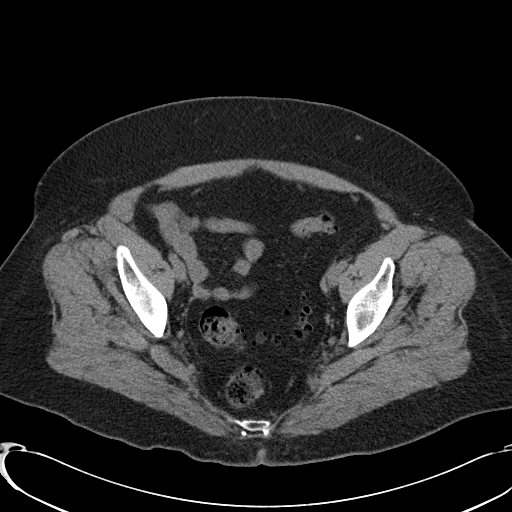
[im 24/96  soft-tissue]
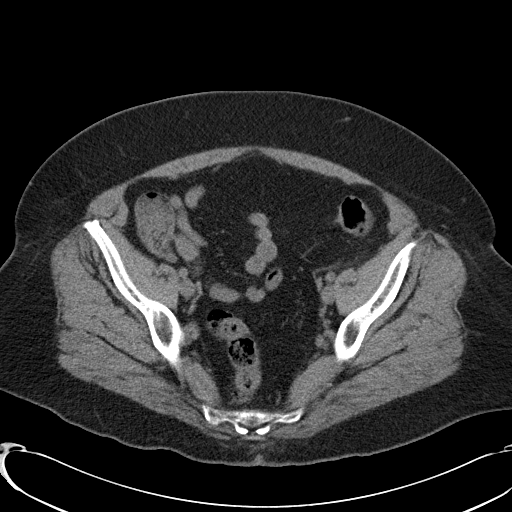
[im 32/96  soft-tissue]
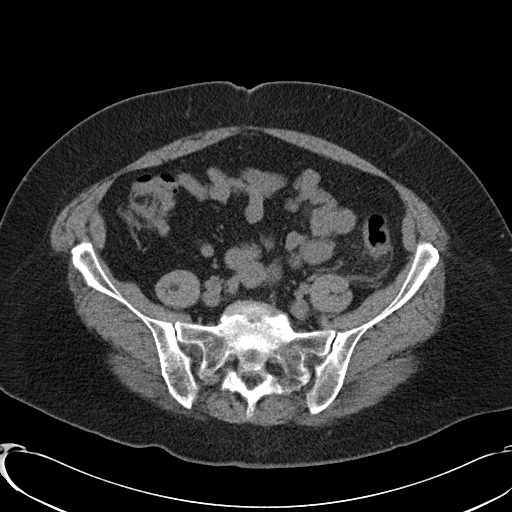
[im 40/96  soft-tissue]
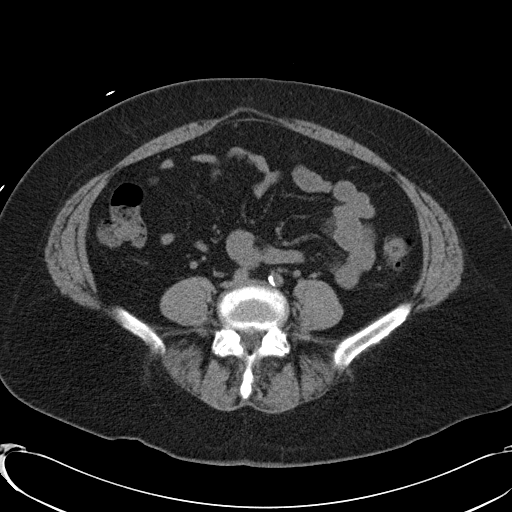
[im 44/96  soft-tissue]
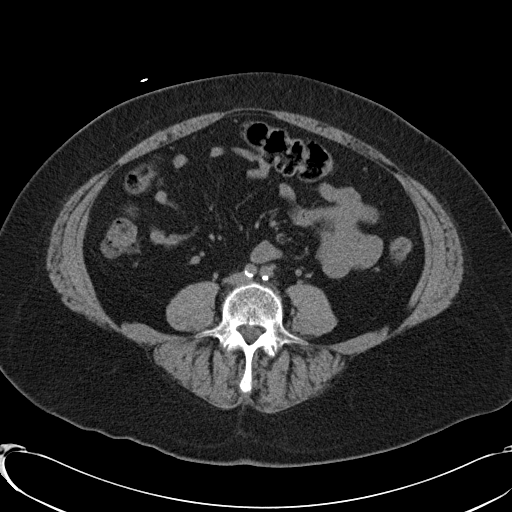
[im 52/96  soft-tissue]
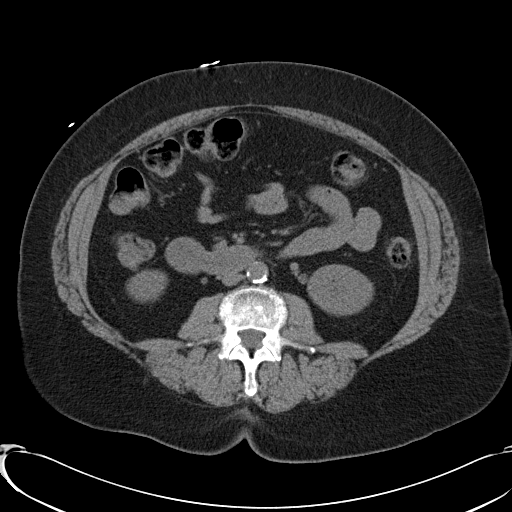
[im 56/96  soft-tissue]
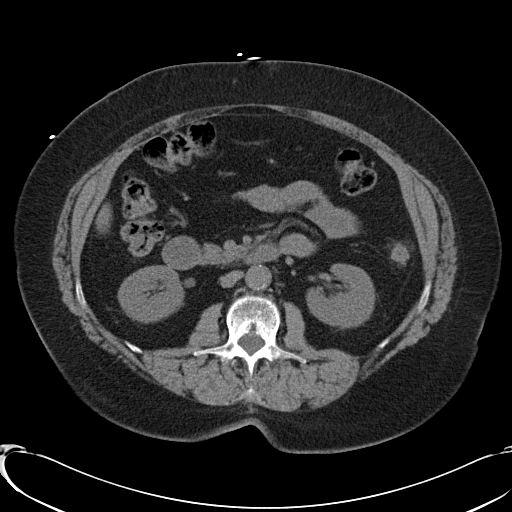
[im 56/96  bone]
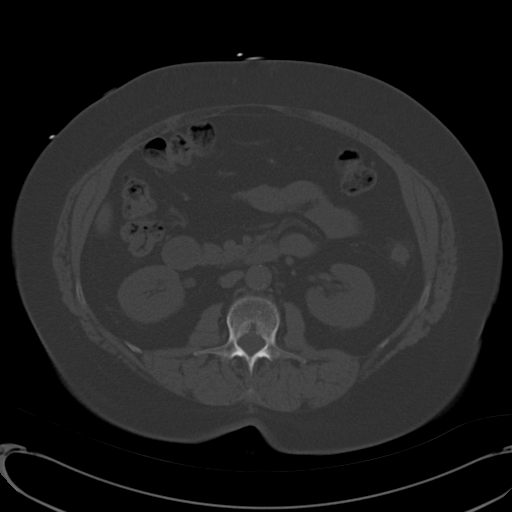
[im 64/96  soft-tissue]
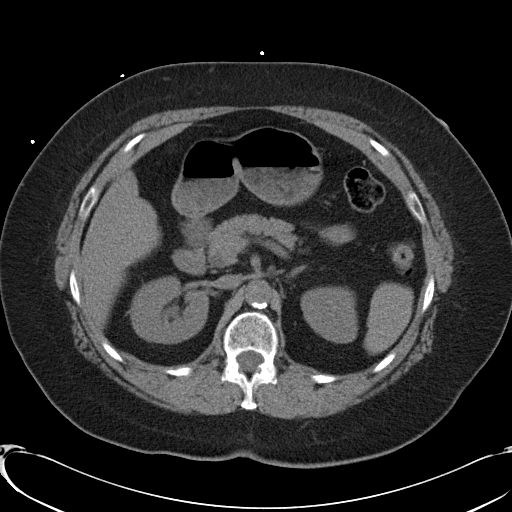
[im 72/96  soft-tissue]
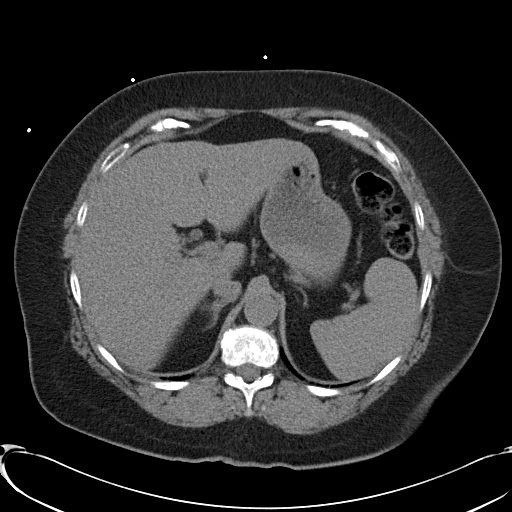
[im 76/96  soft-tissue]
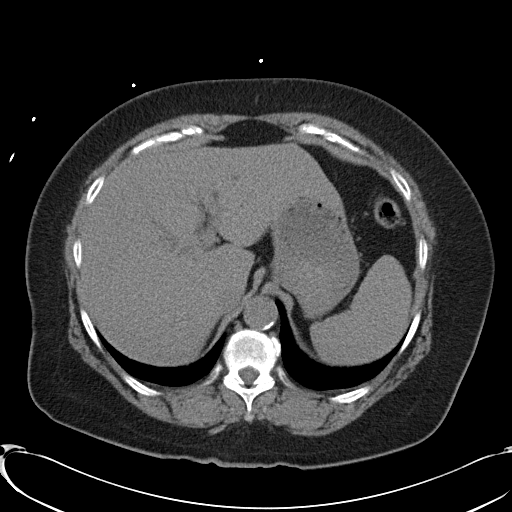
[im 84/96  soft-tissue]
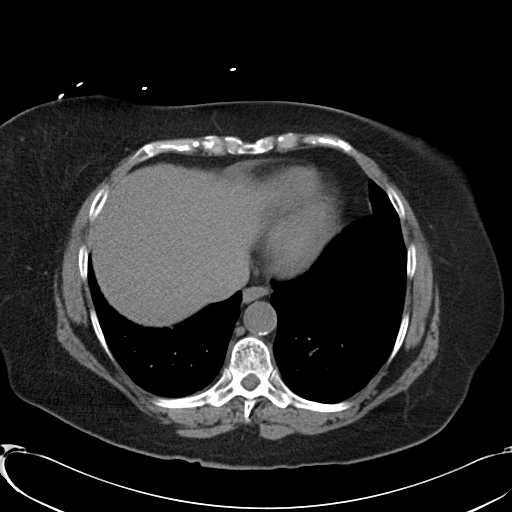
[im 92/96  soft-tissue]
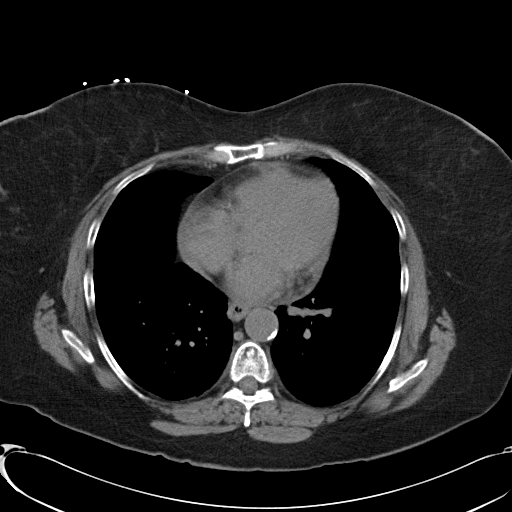

[Series 5: coronals · coronal · 0.95mm/px · 3 of 105 slices shown]
[im 35/105  soft-tissue]
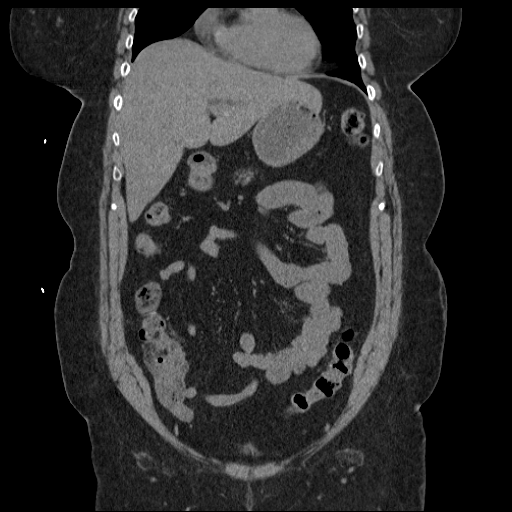
[im 47/105  soft-tissue]
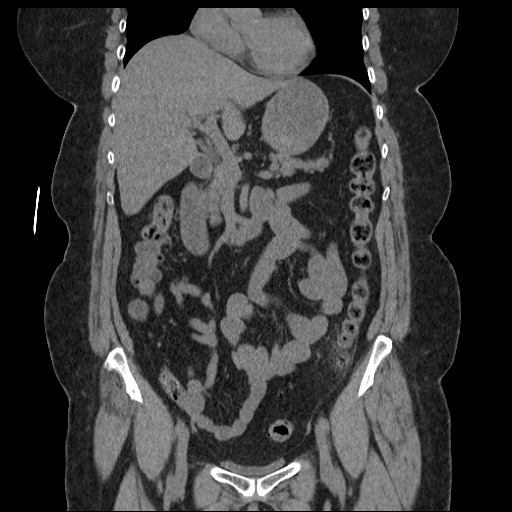
[im 58/105  soft-tissue]
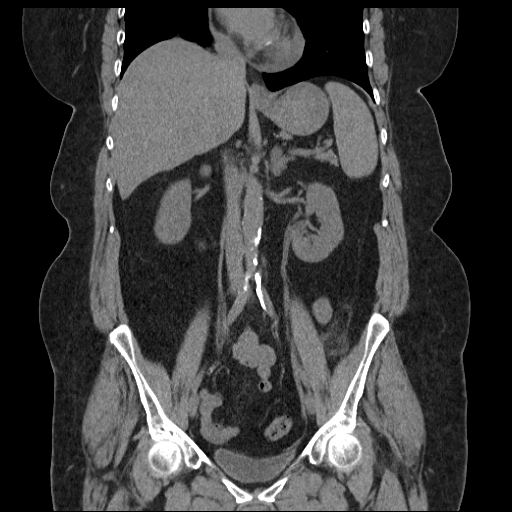

[17 of 46 positions shown; findings below may reference images not displayed]

FINDINGS: Minimal patchy opacity in the lingula.

Unenhanced liver, spleen, pancreas, and adrenal glands within
normal limits.

Status post cholecystectomy.  No intrahepatic or extrahepatic
ductal dilatation.

Kidneys within normal limits.  No renal calculi or hydronephrosis.

No evidence of bowel obstruction.  Normal appendix.  Colonic
diverticulosis with mild stranding involving the distal descending
colon (series 2/image 68), compatible with mild diverticulitis.

No drainable fluid collection or abscess.  No free air.

Atherosclerotic calcifications of the abdominal aorta and branch
vessels.

No abdominopelvic ascites.

No suspicious abdominopelvic lymphadenopathy.

Status post hysterectomy.  No adnexal masses.

No ureteral or bladder calculi.

Mild degenerative changes of the visualized thoracolumbar spine.
IMPRESSION: Mild diverticulitis involving the distal descending colon.  No
drainable fluid collection or abscess.  No free air.

## 2012-08-10 ENCOUNTER — Encounter: Payer: Self-pay | Admitting: Family

## 2012-08-10 ENCOUNTER — Ambulatory Visit (INDEPENDENT_AMBULATORY_CARE_PROVIDER_SITE_OTHER): Payer: Medicare Other | Admitting: Family

## 2012-08-10 VITALS — BP 100/80 | HR 104 | Temp 97.9°F | Wt 180.0 lb

## 2012-08-10 DIAGNOSIS — J069 Acute upper respiratory infection, unspecified: Secondary | ICD-10-CM

## 2012-08-10 DIAGNOSIS — R059 Cough, unspecified: Secondary | ICD-10-CM

## 2012-08-10 DIAGNOSIS — C3490 Malignant neoplasm of unspecified part of unspecified bronchus or lung: Secondary | ICD-10-CM

## 2012-08-10 DIAGNOSIS — R05 Cough: Secondary | ICD-10-CM

## 2012-08-10 MED ORDER — METHYLPREDNISOLONE 4 MG PO KIT: PACK | ORAL | Status: AC

## 2012-08-10 MED ORDER — HYDROCOD POLST-CHLORPHEN POLST 10-8 MG/5ML PO LQCR: 5.0000 mL | Freq: Two times a day (BID) | ORAL | Status: DC | PRN

## 2012-08-10 NOTE — Patient Instructions (Addendum)

## 2012-08-10 NOTE — Progress Notes (Signed)
Subjective:    Patient ID: [name], female    DOB: Mar 19, 1946, 66 y.o.   MRN: [id number]  HPI  66 year old white female, nonsmoker, is in today with c/o cough, sinus drainage, sore throat, node swelling, and ear pain x 1 day. She has been using sore throat lozenges with no relief. Denies fever. She has a history of small cell lung cancer that is in remission. She has a recheck of lung cancer next week with oncology.   Review of Systems  Constitutional: Negative.   HENT: Positive for congestion, sneezing and postnasal drip.   Eyes: Negative.   Respiratory: Positive for cough. Negative for shortness of breath and wheezing.   Cardiovascular: Negative.   Skin: Negative.   Hematological: Negative.   Psychiatric/Behavioral: Negative.    Past Medical History  Diagnosis Date  . Diabetes mellitus   . GERD (gastroesophageal reflux disease)   . Lung cancer     non-small cell lung ca, stage III in 05/2011, s/p chemo/radiation  . COPD (chronic obstructive pulmonary disease)   . History of diverticulitis of colon   . CAD (coronary artery disease)     NON obstructive. Cath 2006 preserved LV fxn, scattered irregularities without critical stenosis. 2008 stress echo negative for ischemia, but with hypertensive response  . Hyperlipidemia   . Cancer of lung 12/24/2008    treated with radiation and chemotherapy    History   Social History  . Marital Status: Married    Spouse Name: N/A    Number of Children: N/A  . Years of Education: N/A   Occupational History  . Retired Engineer, petroleum    Social History Main Topics  . Smoking status: Former Smoker -- 1.0 packs/day for 45 years    Types: Cigarettes    Quit date: 10/24/2008  . Smokeless tobacco: Not on file  . Alcohol Use: No  . Drug Use: No  . Sexually Active: Not on file   Other Topics Concern  . Not on file   Social History Narrative   Lives in [place], has husband and daughter and son. Daughter's family is living with  her and husband at present. She is ambulatory daily without cane or walker.     Past Surgical History  Procedure Date  . Appendectomy   . Abdominal hysterectomy 1997  . Carpal tunnel release     Family History  Problem Relation Age of Onset  . Heart failure Mother   . Kidney disease Mother     renal failure  . Diabetes Mother   . Diabetes Father   . Diabetes Brother   . Heart disease Brother   . Heart attack Brother   . Heart attack Brother   . Heart attack Brother     Allergies  Allergen Reactions  . Lactose Intolerance (Gi) Diarrhea  . Ibuprofen     REACTION: Anxiousness, hypoventilate     Current Outpatient Prescriptions on File Prior to Visit  Medication Sig Dispense Refill  . aspirin 81 MG tablet Take 81 mg by mouth daily.      . insulin NPH-insulin regular (NOVOLIN 70/30) (70-30) 100 UNIT/ML injection 25 units in a.m. And 22 units in the evening. Take with meals  60 mL  12  . loratadine (CLARITIN) 10 MG tablet Take 10 mg by mouth daily.        . nitroGLYCERIN (NITROSTAT) 0.4 MG SL tablet Place 0.4 mg under the tongue every 5 (five) minutes as needed. For chest pain      .  simvastatin (ZOCOR) 10 MG tablet Take 10 mg by mouth at bedtime.        BP 100/80  Pulse 104  Temp 97.9 F (36.6 C) (Oral)  Wt 180 lb (81.647 kg)  SpO2 98%chart    Objective:   Physical Exam  Constitutional: She is oriented to person, place, and time. She appears well-developed and well-nourished.  HENT:  Right Ear: External ear normal.  Left Ear: External ear normal.  Nose: Nose normal.  Mouth/Throat: Oropharynx is clear and moist.  Neck: Normal range of motion. Neck supple.  Cardiovascular: Normal rate, regular rhythm and normal heart sounds.   Pulmonary/Chest: Effort normal. She has wheezes.       Very mild wheezing noted to the base of the right lung  Abdominal: Soft. Bowel sounds are normal.  Neurological: She is alert and oriented to person, place, and time.  Skin: Skin is  warm and dry.  Psychiatric: She has a normal mood and affect.          Assessment & Plan:  Assessment: Upper Respiratory Infection, Cough, Small cell Lung Cancer  Plan: Medrol dosepak as directed. Tussinex 1 tsp twice a day as needed for cough. Warned of drowsiness. Call the office if symptoms worsen or persist. Recheck as scheduled and sooner as needed.

## 2012-08-17 ENCOUNTER — Ambulatory Visit (HOSPITAL_COMMUNITY)
Admission: RE | Admit: 2012-08-17 | Discharge: 2012-08-17 | Disposition: A | Discharge status: Home / Self Care | Payer: Medicare Other | Source: Ambulatory Visit | Attending: Internal Medicine | Admitting: Internal Medicine

## 2012-08-17 ENCOUNTER — Other Ambulatory Visit (HOSPITAL_BASED_OUTPATIENT_CLINIC_OR_DEPARTMENT_OTHER): Payer: Medicare Other | Admitting: Lab

## 2012-08-17 DIAGNOSIS — C3490 Malignant neoplasm of unspecified part of unspecified bronchus or lung: Secondary | ICD-10-CM

## 2012-08-17 DIAGNOSIS — Z1289 Encounter for screening for malignant neoplasm of other sites: Secondary | ICD-10-CM | POA: Insufficient documentation

## 2012-08-17 LAB — COMPREHENSIVE METABOLIC PANEL (CC13)
ALT: 13 U/L (ref 0–55)
AST: 11 U/L (ref 5–34)
Alkaline Phosphatase: 108 U/L (ref 40–150)
Creatinine: 0.7 mg/dL (ref 0.6–1.1)
Total Bilirubin: 0.4 mg/dL (ref 0.20–1.20)

## 2012-08-17 LAB — CBC WITH DIFFERENTIAL/PLATELET
BASO%: 0.2 % (ref 0.0–2.0)
EOS%: 1.3 % (ref 0.0–7.0)
HCT: 42.7 % (ref 34.8–46.6)
LYMPH%: 15.4 % (ref 14.0–49.7)
MCH: 32.2 pg (ref 25.1–34.0)
MCHC: 34.7 g/dL (ref 31.5–36.0)
NEUT%: 74 % (ref 38.4–76.8)
Platelets: 203 10*3/uL (ref 145–400)

## 2012-08-17 MED ORDER — IOHEXOL 300 MG/ML  SOLN
80.0000 mL | Freq: Once | INTRAMUSCULAR | Status: AC | PRN
  Administered 2012-08-17: 80 mL via INTRAVENOUS

## 2012-08-21 ENCOUNTER — Telehealth: Payer: Self-pay | Admitting: Internal Medicine

## 2012-08-21 ENCOUNTER — Ambulatory Visit (HOSPITAL_BASED_OUTPATIENT_CLINIC_OR_DEPARTMENT_OTHER): Payer: Medicare Other | Admitting: Internal Medicine

## 2012-08-21 DIAGNOSIS — C3410 Malignant neoplasm of upper lobe, unspecified bronchus or lung: Secondary | ICD-10-CM

## 2012-08-21 DIAGNOSIS — C3490 Malignant neoplasm of unspecified part of unspecified bronchus or lung: Secondary | ICD-10-CM

## 2012-08-21 NOTE — Patient Instructions (Signed)
Your CT scan of the chest showed no evidence for disease recurrence. Followup in 6 months with repeat CT scan. 

## 2012-08-21 NOTE — Telephone Encounter (Signed)
appts made and printed for pt aom °

## 2012-08-21 NOTE — Progress Notes (Signed)
[facility] [phone]   Fax:([phone]) [phone]  OFFICE PROGRESS NOTE  [name], MD [address] [place] [address]  PRINCIPAL DIAGNOSIS: Limited stage small cell lung cancer diagnosed in March of 2010.   PRIOR THERAPY:  1. Status post 4 cycles of systemic chemotherapy with carboplatin and etoposide concurrent with radiation during cycle 2 and 3. The last dose of chemotherapy was given April 01, 2009. 2. Status post prophylactic cranial irradiation under the care of Dr. Mitzi Hansen completed May 19, 2009.  CURRENT THERAPY: Observation.  INTERVAL HISTORY: [name] 66 y.o. female returns to the clinic today for six-month followup visit accompanied by her husband. The patient is feeling fine with no specific complaints except for left shoulder pain and this is currently under evaluation by Dr. Cato Mulligan. She denied having any significant weight loss or night sweats. She denied having any chest pain, shortness breath, cough or hemoptysis. She has repeat CT scan of the chest performed recently and she is here for evaluation and discussion of her scan results.  MEDICAL HISTORY: Past Medical History  Diagnosis Date  . Diabetes mellitus   . GERD (gastroesophageal reflux disease)   . Lung cancer     non-small cell lung ca, stage III in 05/2011, s/p chemo/radiation  . COPD (chronic obstructive pulmonary disease)   . History of diverticulitis of colon   . CAD (coronary artery disease)     NON obstructive. Cath 2006 preserved LV fxn, scattered irregularities without critical stenosis. 2008 stress echo negative for ischemia, but with hypertensive response  . Hyperlipidemia   . Cancer of lung 12/24/2008    treated with radiation and chemotherapy    ALLERGIES:  is allergic to lactose intolerance (gi) and ibuprofen.  MEDICATIONS:  Current Outpatient Prescriptions  Medication Sig Dispense Refill  . aspirin 81 MG tablet Take 81 mg by mouth daily.       . chlorpheniramine-HYDROcodone (TUSSIONEX PENNKINETIC ER) 10-8 MG/5ML LQCR Take 5 mLs by mouth every 12 (twelve) hours as needed.  140 mL  0  . insulin NPH-insulin regular (NOVOLIN 70/30) (70-30) 100 UNIT/ML injection 25 units in a.m. And 22 units in the evening. Take with meals  60 mL  12  . loratadine (CLARITIN) 10 MG tablet Take 10 mg by mouth daily.        . nitroGLYCERIN (NITROSTAT) 0.4 MG SL tablet Place 0.4 mg under the tongue every 5 (five) minutes as needed. For chest pain      . simvastatin (ZOCOR) 10 MG tablet Take 10 mg by mouth at bedtime.        SURGICAL HISTORY:  Past Surgical History  Procedure Date  . Appendectomy   . Abdominal hysterectomy 1997  . Carpal tunnel release     REVIEW OF SYSTEMS:  A comprehensive review of systems was negative.   PHYSICAL EXAMINATION: General appearance: alert, cooperative and no distress Head: Normocephalic, without obvious abnormality, atraumatic Neck: no adenopathy Lymph nodes: Cervical, supraclavicular, and axillary nodes normal. Resp: clear to auscultation bilaterally Cardio: regular rate and rhythm, S1, S2 normal, no murmur, click, rub or gallop GI: soft, non-tender; bowel sounds normal; no masses,  no organomegaly Extremities: extremities normal, atraumatic, no cyanosis or edema  ECOG PERFORMANCE STATUS: 0 - Asymptomatic  There were no vitals taken for this visit.  LABORATORY DATA: Lab Results  Component Value Date   WBC 9.2 08/17/2012   HGB 14.8 08/17/2012   HCT 42.7 08/17/2012   MCV  92.7 08/17/2012   PLT 203 08/17/2012      Chemistry      Component Value Date/Time   NA 140 08/17/2012 0911   NA 137 07/06/2012 0911   NA 141 02/16/2012 0803   K 4.1 08/17/2012 0911   K 4.4 07/06/2012 0911   K 3.9 02/16/2012 0803   CL 107 08/17/2012 0911   CL 104 07/06/2012 0911   CL 99 02/16/2012 0803   CO2 25 08/17/2012 0911   CO2 26 07/06/2012 0911   CO2 28 02/16/2012 0803   BUN 18.0 08/17/2012 0911   BUN 13 07/06/2012 0911    BUN 12 02/16/2012 0803   CREATININE 0.7 08/17/2012 0911   CREATININE 0.8 07/06/2012 0911   CREATININE 0.9 02/16/2012 0803      Component Value Date/Time   CALCIUM 9.4 08/17/2012 0911   CALCIUM 9.1 07/06/2012 0911   CALCIUM 8.7 02/16/2012 0803   ALKPHOS 108 08/17/2012 0911   ALKPHOS 84 07/06/2012 0911   ALKPHOS 75 02/16/2012 0803   AST 11 08/17/2012 0911   AST 22 07/06/2012 0911   AST 20 02/16/2012 0803   ALT 13 08/17/2012 0911   ALT 21 07/06/2012 0911   BILITOT 0.40 08/17/2012 0911   BILITOT 0.5 07/06/2012 0911   BILITOT 0.70 02/16/2012 0803       RADIOGRAPHIC STUDIES: Ct Chest W Contrast  08/17/2012  *RADIOLOGY REPORT*  Clinical Data: Cough.  Lung carcinoma. Completed chemotherapy and radiation therapy.  CT CHEST WITH CONTRAST  Technique:  Multidetector CT imaging of the chest was performed following the standard protocol during bolus administration of intravenous contrast.  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  There is no evidence of recurrent mass or lymphadenopathy.  No evidence of pleural or pericardial effusion.  No lymphadenopathy seen elsewhere within the thorax.  No suspicious bone lesions identified.  Both adrenal glands are normal in appearance.  Tiny hypervascular focus in the anterior liver dome remains stable and most consistent with a benign etiology.  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  No evidence of recurrent or metastatic carcinoma, or other active disease.   Original Report Authenticated By: Danae Orleans, M.D.     ASSESSMENT: This is a very pleasant 66 years old white female with history of limited stage small cell lung cancer diagnosed in March of 2010 status post systemic chemotherapy concurrent with radiation followed by prophylactic cranial irradiation. The patient has been observation since July  2010 was no evidence for disease recurrence.   PLAN: I discussed the scan results with the patient and her husband. I recommended for her to continue on observation for now with repeat CT scan of the chest with contrast in 6 months. She was advised to call me immediately if she has any concerning symptoms in the interval.  All questions were answered. The patient knows to call the clinic with any problems, questions or concerns. We can certainly see the patient much sooner if necessary.

## 2012-09-05 ENCOUNTER — Ambulatory Visit (INDEPENDENT_AMBULATORY_CARE_PROVIDER_SITE_OTHER): Payer: Medicare Other | Admitting: Family Medicine

## 2012-09-05 ENCOUNTER — Encounter: Payer: Self-pay | Admitting: Family Medicine

## 2012-09-05 VITALS — BP 102/80 | HR 108 | Temp 97.5°F | Wt 178.5 lb

## 2012-09-05 DIAGNOSIS — J309 Allergic rhinitis, unspecified: Secondary | ICD-10-CM

## 2012-09-05 DIAGNOSIS — J069 Acute upper respiratory infection, unspecified: Secondary | ICD-10-CM

## 2012-09-05 MED ORDER — FLUTICASONE PROPIONATE 50 MCG/ACT NA SUSP: 2.0000 | Freq: Every day | NASAL | Status: DC

## 2012-09-05 NOTE — Patient Instructions (Addendum)
INSTRUCTIONS FOR UPPER RESPIRATORY INFECTION:  -plenty of rest and fluids  -nasal saline wash 2-3 times daily (use prepackaged nasal saline or bottled/distilled water if making your own)   -clean nose with nasal saline before using the nasal steroid or sinex  -can use sinex nasal spray for drainage and nasal congestion - but do NOT use longer then 3-4 days  -can use tylenol or ibuprofen as directed for aches and sorethroat  -in the winter time, using a humidifier at night is helpful (please follow cleaning instructions)  -if you are taking a cough medication - use only as directed, may also try a teaspoon of honey to coat the throat and throat lozenges  -for sore throat, salt water gargles can help  -follow up if you have fevers, are worsening or not getting better in 3-4 days

## 2012-09-05 NOTE — Progress Notes (Signed)
Chief Complaint  Patient presents with  . Cough    congestion, hadache    HPI: -started: 1 week -symptoms:nose is drippy, sore throat, cough, ears stopped up -starting to feel a little better today -denies:fever, SOB, NVD, tooth pain, sinus pain, strep or mono exposure -has tried: takes claritin, took benadryl -Hx of: COPD   ROS: See pertinent positives and negatives per HPI.  Past Medical History  Diagnosis Date  . Diabetes mellitus   . GERD (gastroesophageal reflux disease)   . Lung cancer     non-small cell lung ca, stage III in 05/2011, s/p chemo/radiation  . COPD (chronic obstructive pulmonary disease)   . History of diverticulitis of colon   . CAD (coronary artery disease)     NON obstructive. Cath 2006 preserved LV fxn, scattered irregularities without critical stenosis. 2008 stress echo negative for ischemia, but with hypertensive response  . Hyperlipidemia   . Cancer of lung 12/24/2008    treated with radiation and chemotherapy    Family History  Problem Relation Age of Onset  . Heart failure Mother   . Kidney disease Mother     renal failure  . Diabetes Mother   . Diabetes Father   . Diabetes Brother   . Heart disease Brother   . Heart attack Brother   . Heart attack Brother   . Heart attack Brother     History   Social History  . Marital Status: Married    Spouse Name: N/A    Number of Children: N/A  . Years of Education: N/A   Occupational History  . Retired Engineer, petroleum    Social History Main Topics  . Smoking status: Former Smoker -- 1.0 packs/day for 45 years    Types: Cigarettes    Quit date: 10/24/2008  . Smokeless tobacco: None  . Alcohol Use: No  . Drug Use: No  . Sexually Active: None   Other Topics Concern  . None   Social History Narrative   Lives in [place], has husband and daughter and son. Daughter's family is living with her and husband at present. She is ambulatory daily without cane or walker.     Current  outpatient prescriptions:aspirin 81 MG tablet, Take 81 mg by mouth daily., Disp: , Rfl: ;  chlorpheniramine-HYDROcodone (TUSSIONEX PENNKINETIC ER) 10-8 MG/5ML LQCR, Take 5 mLs by mouth every 12 (twelve) hours as needed., Disp: 140 mL, Rfl: 0;  insulin NPH-insulin regular (NOVOLIN 70/30) (70-30) 100 UNIT/ML injection, 25 units in a.m. And 22 units in the evening. Take with meals, Disp: 60 mL, Rfl: 12 loratadine (CLARITIN) 10 MG tablet, Take 10 mg by mouth daily.  , Disp: , Rfl: ;  nitroGLYCERIN (NITROSTAT) 0.4 MG SL tablet, Place 0.4 mg under the tongue every 5 (five) minutes as needed. For chest pain, Disp: , Rfl: ;  simvastatin (ZOCOR) 10 MG tablet, Take 10 mg by mouth at bedtime., Disp: , Rfl: ;  fluticasone (FLONASE) 50 MCG/ACT nasal spray, Place 2 sprays into the nose daily., Disp: 16 g, Rfl: 0  EXAM:  Filed Vitals:   09/05/12 0803  BP: 102/80  Pulse: 108  Temp: 97.5 F (36.4 C)    There is no height on file to calculate BMI.  GENERAL: vitals reviewed and listed above, alert, oriented, appears well hydrated and in no acute distress  HEENT: atraumatic, conjunttiva clear, no obvious abnormalities on inspection of external nose and ears, normal appearance of ear canals and TMs, clear nasal congestion, mild post oropharyngeal  erythema with PND, no tonsillar edema or exudate, no sinus TTP  NECK: no obvious masses on inspection  LUNGS: clear to auscultation bilaterally, no wheezes, rales or rhonchi, good air movement  CV: HRRR, no peripheral edema  MS: moves all extremities without noticeable abnormality  PSYCH: pleasant and cooperative, no obvious depression or anxiety  ASSESSMENT AND PLAN:  Discussed the following assessment and plan:  1. Allergic rhinitis    2. Upper respiratory infection  fluticasone (FLONASE) 50 MCG/ACT nasal spray   -hx and exam c/w VURI with possible underlying AR - tx per orders and below -Patient advised to return or notify a doctor immediately if symptoms  worsen or persist or new concerns arise.  Patient Instructions  INSTRUCTIONS FOR UPPER RESPIRATORY INFECTION:  -plenty of rest and fluids  -nasal saline wash 2-3 times daily (use prepackaged nasal saline or bottled/distilled water if making your own)   -clean nose with nasal saline before using the nasal steroid or sinex  -can use sinex nasal spray for drainage and nasal congestion - but do NOT use longer then 3-4 days  -can use tylenol or ibuprofen as directed for aches and sorethroat  -in the winter time, using a humidifier at night is helpful (please follow cleaning instructions)  -if you are taking a cough medication - use only as directed, may also try a teaspoon of honey to coat the throat and throat lozenges  -for sore throat, salt water gargles can help  -follow up if you have fevers, are worsening or not getting better in 3-4 days      Vee Bahe R.

## 2012-09-24 ENCOUNTER — Telehealth: Payer: Self-pay | Admitting: Internal Medicine

## 2012-09-24 DIAGNOSIS — IMO0002 Reserved for concepts with insufficient information to code with codable children: Secondary | ICD-10-CM

## 2012-09-24 DIAGNOSIS — E118 Type 2 diabetes mellitus with unspecified complications: Secondary | ICD-10-CM

## 2012-09-24 NOTE — Telephone Encounter (Signed)
Caller is calling to request RX for Diabetic supplies.  Caller states that he and his wife used to get their diabetic supplies from [facility].  But caller states after the change with ObamaCare, they will not be able to get his supplies there.  Caller is has called and the pharmacy that if Dr Cato Mulligan will write a prescription for a machine and strips, he can use his insurance to get the supplies.  Caller is requesting RX be sent to [facility] in [place].  [phone]

## 2012-09-25 MED ORDER — FREESTYLE LANCETS MISC: Status: DC

## 2012-09-25 MED ORDER — GLUCOSE BLOOD VI STRP: ORAL_STRIP | Status: DC

## 2012-09-25 NOTE — Telephone Encounter (Signed)
Pt is going to p/u glucometer, rx for lancets and test strips sent in to [facility] [place] per pts request

## 2012-11-23 IMAGING — CR DG CHEST 2V
2 series · 2 of 2 positions shown · non-contrast
Comparison: Multiple priors, most recently chest x-ray 06/19/2011,
and chest c t 08/17/2011.

CLINICAL DATA: History of cough.  History of lung cancer.

CHEST - 2 VIEW

[w chest pa]
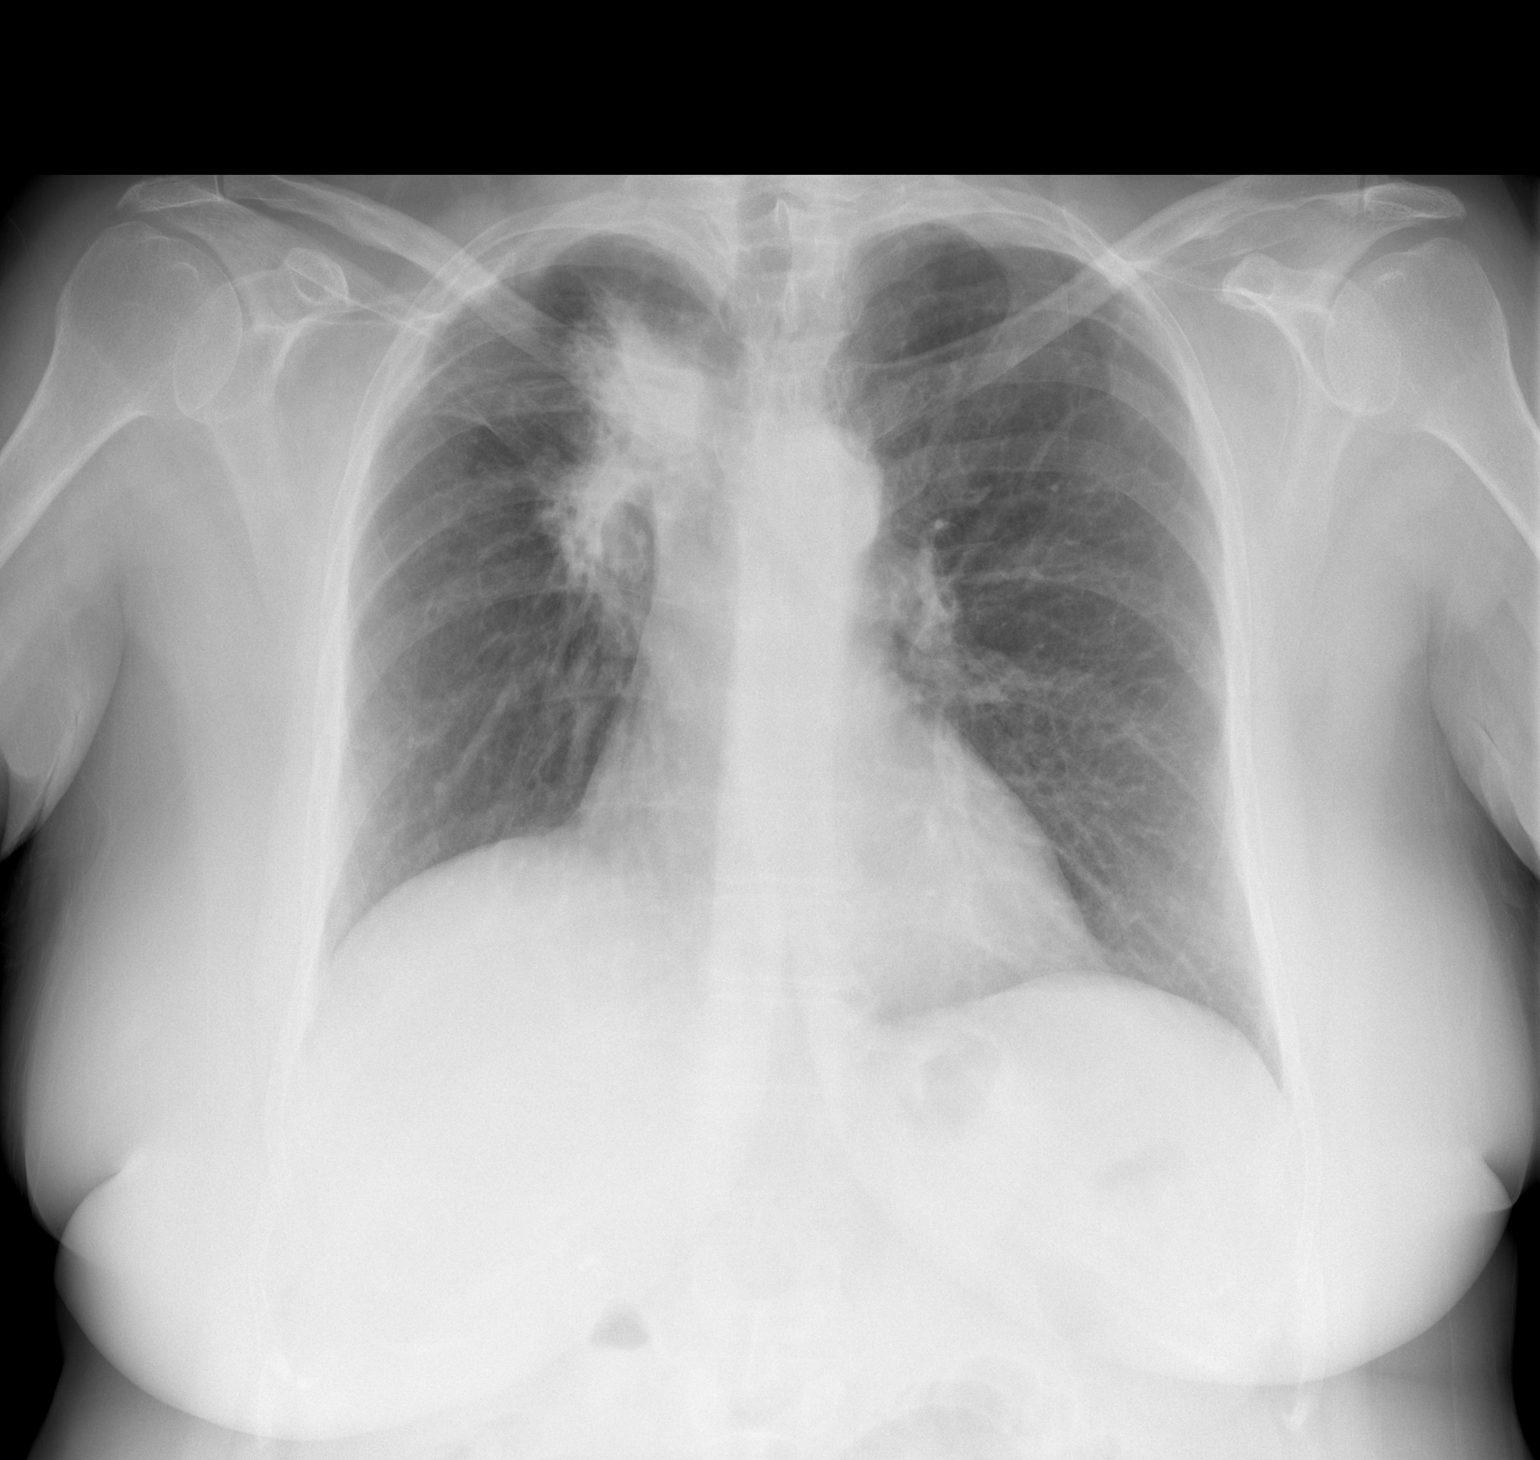

[w chest lat]
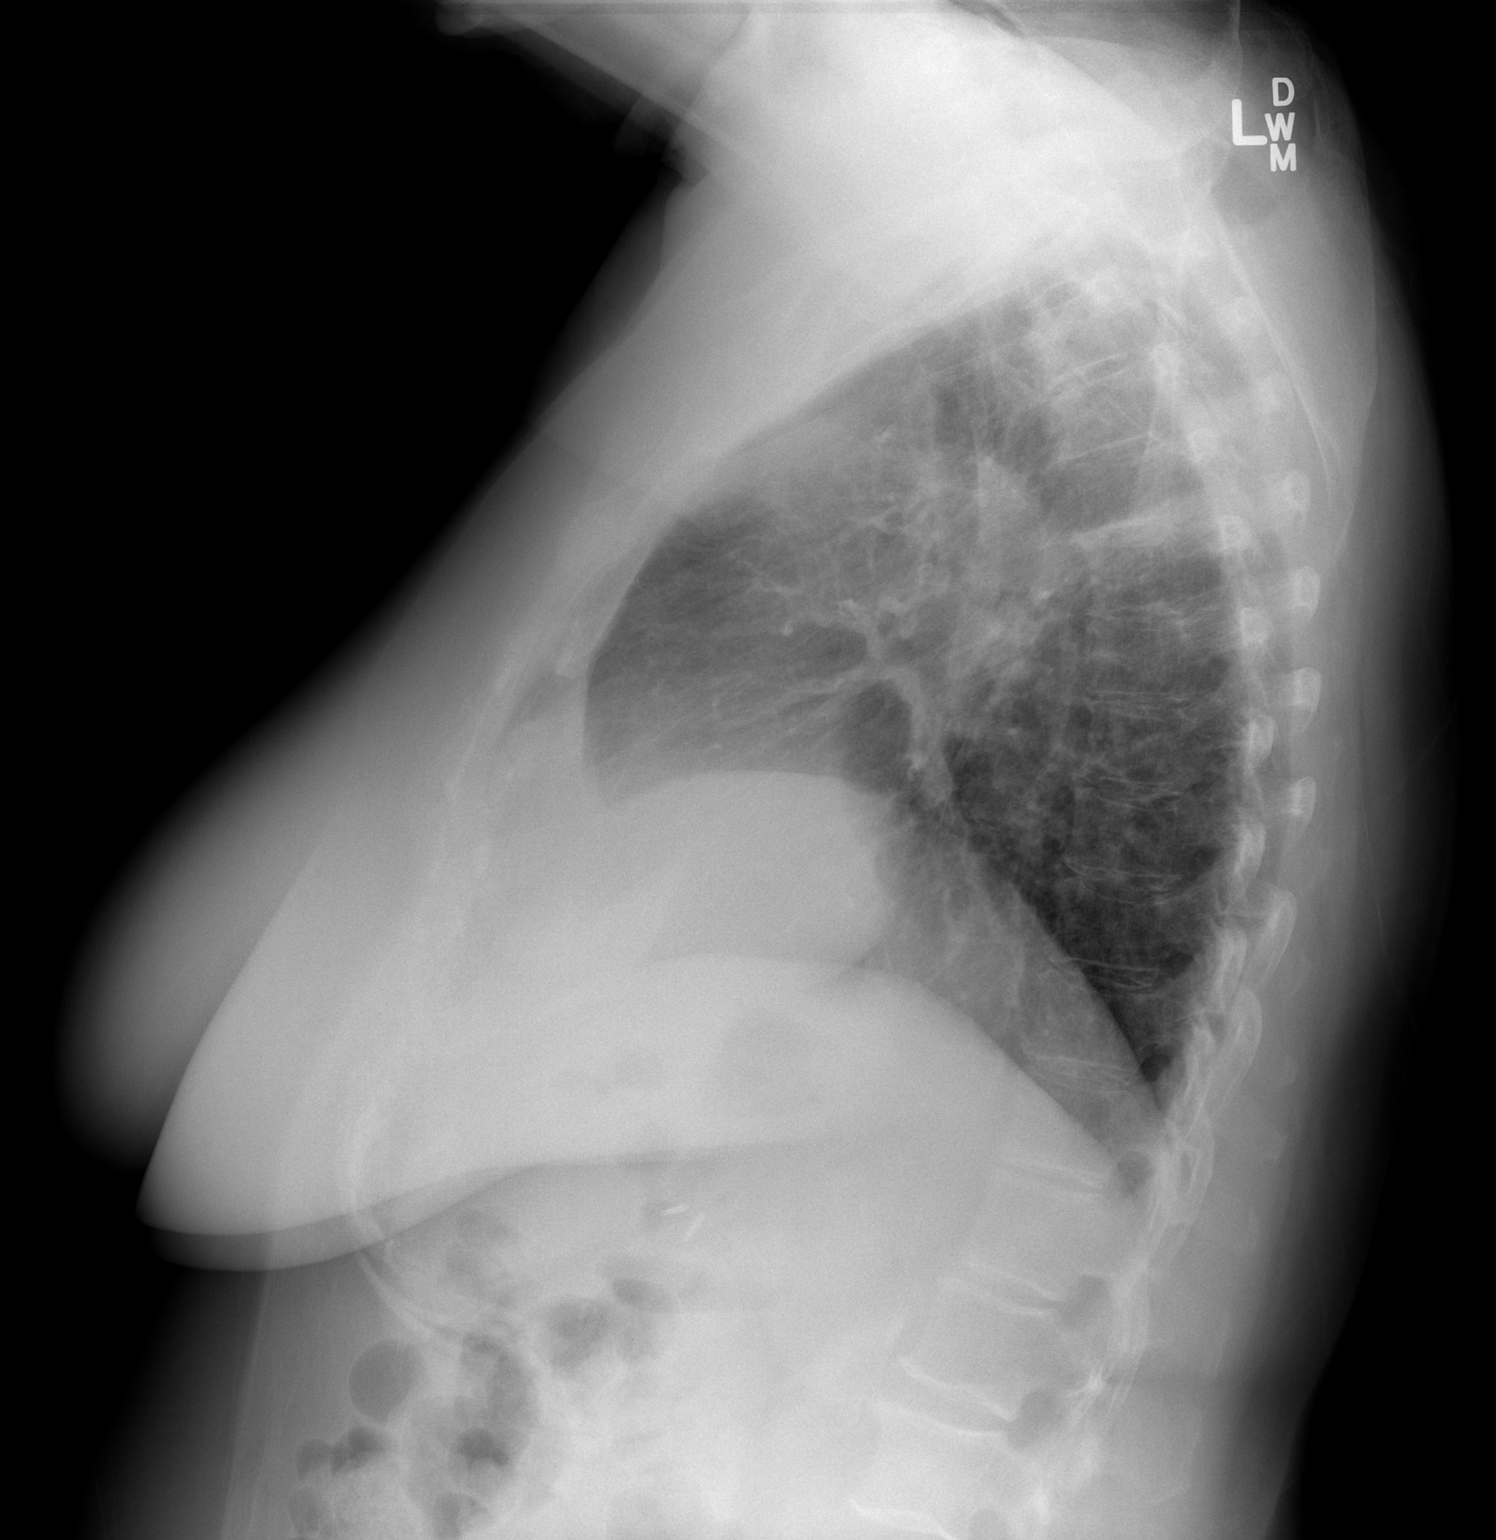

[2 of 2 positions shown; findings below may reference images not displayed]

FINDINGS: Again noted is a focal mass-like opacity in the medial
aspect of the right upper lobe with surrounding architectural
distortion and chronic volume loss as evidenced by cephalad
displacement of the horizontal fissure and elevation of the right
hemidiaphragm.  These findings are chronic.  No definite acute
airspace consolidation.  No pleural effusions.  No evidence of
pulmonary edema.  Heart size is normal.  Slight shift of the
mediastinal structures to the right related to chronic right-sided
volume loss.  Mediastinal contours are otherwise unremarkable.
Surgical clips projecting over the right upper quadrant of the
abdomen, likely from prior cholecystectomy.
IMPRESSION: 1.  Extensive post treatment related changes from radiation therapy
with chronic volume loss and architectural distortion in the right
upper lobe, as above.  These findings appear unchanged, and there
is no radiographic evidence to suggest superimposed acute
cardiopulmonary disease on today's chest x-ray.

## 2012-12-19 IMAGING — CT CT CHEST W/ CM
2 of 3 series · 15 of 36 positions shown, 18 images · IV contrast (80ML OMNI 300)
Comparison: CT abdomen pelvis 10/02/2011 and CT chest 06/19/2011,
02/21/2011.

CLINICAL DATA: Lung cancer, back pain, chest pain, shortness of
breath and cough.

CT CHEST WITH CONTRAST
TECHNIQUE: Multidetector CT imaging of the chest was performed
following the standard protocol during bolus administration of
intravenous contrast.
Contrast: 80mL OMNIPAQUE IOHEXOL 300 MG/ML  SOLN

[Series 2: chest with st · axial · 0.63mm/px · z∈[-238,-8]mm · 12 of 56 slices shown, 15 images]
[im 5/56  mediastinal]
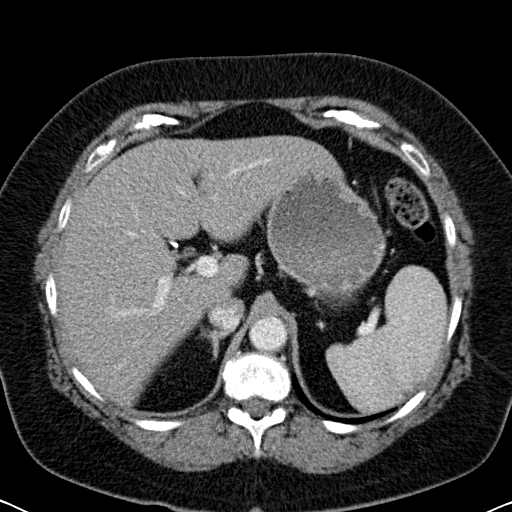
[im 5/56  lung]
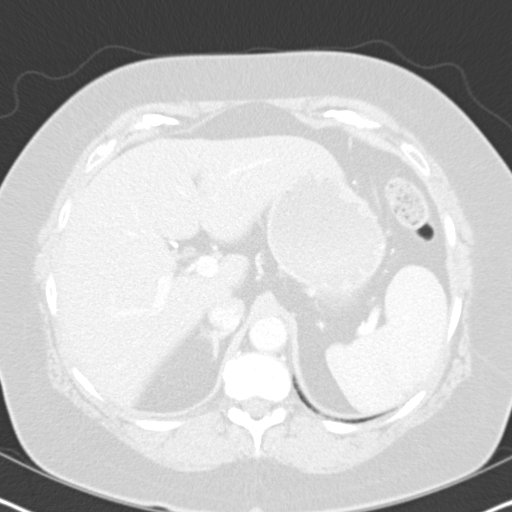
[im 9/56  lung]
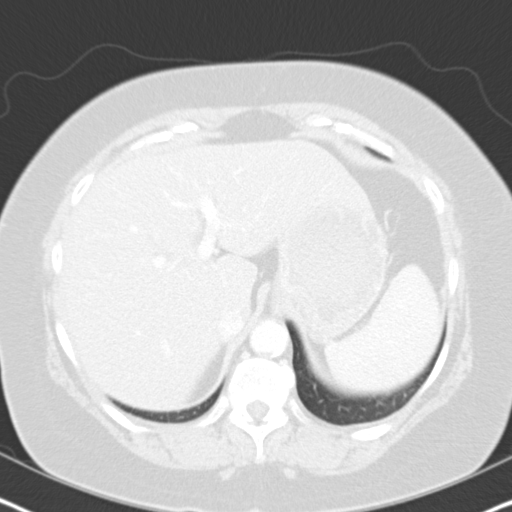
[im 13/56  lung]
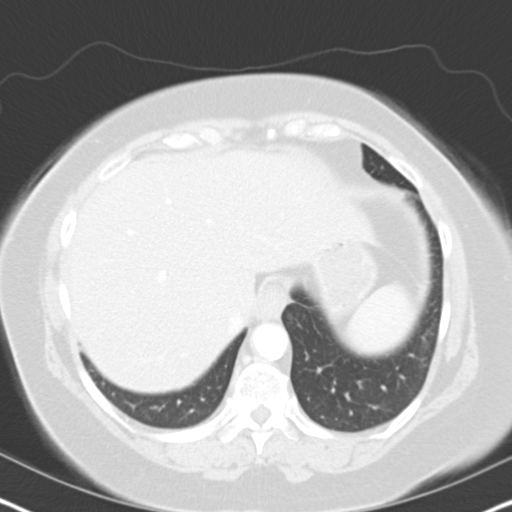
[im 17/56  lung]
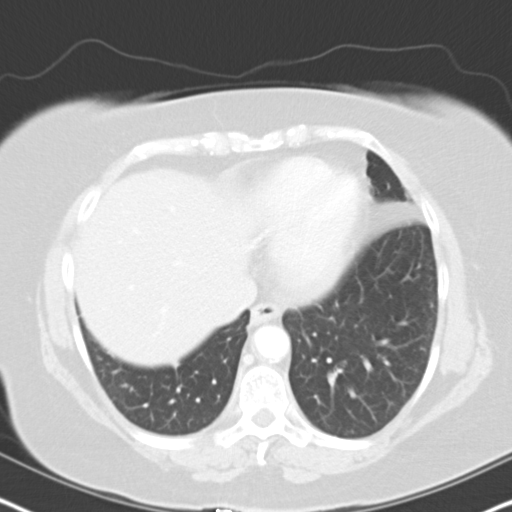
[im 21/56  mediastinal]
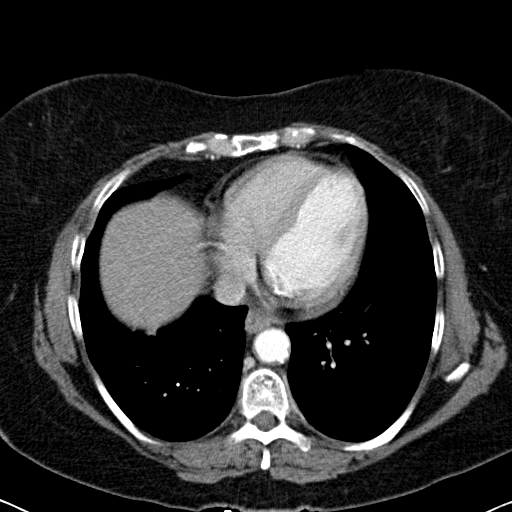
[im 21/56  lung]
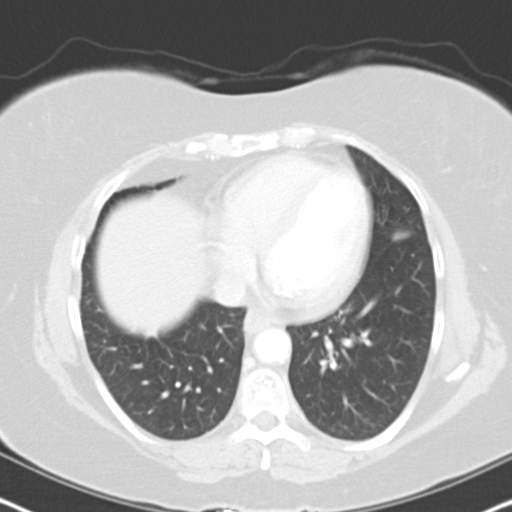
[im 25/56  lung]
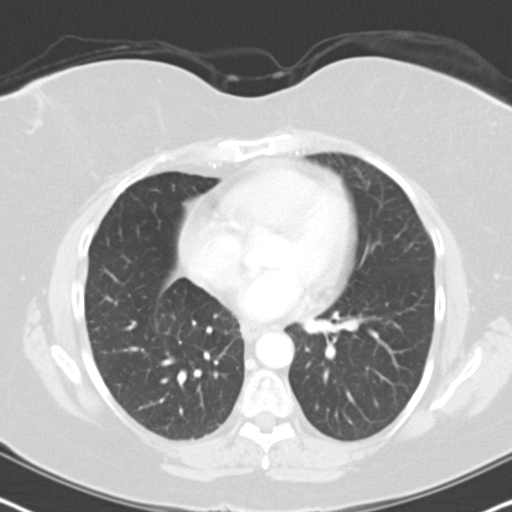
[im 31/56  lung]
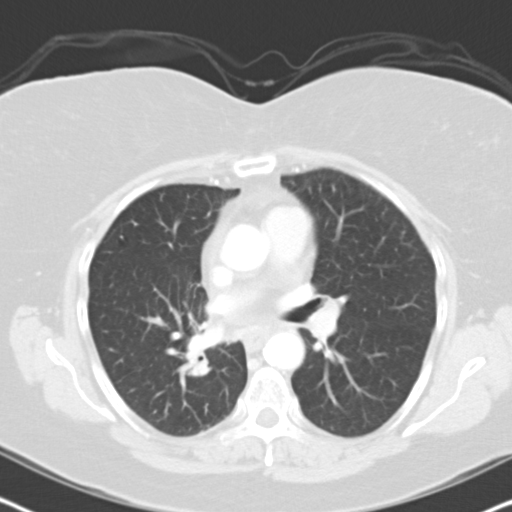
[im 35/56  lung]
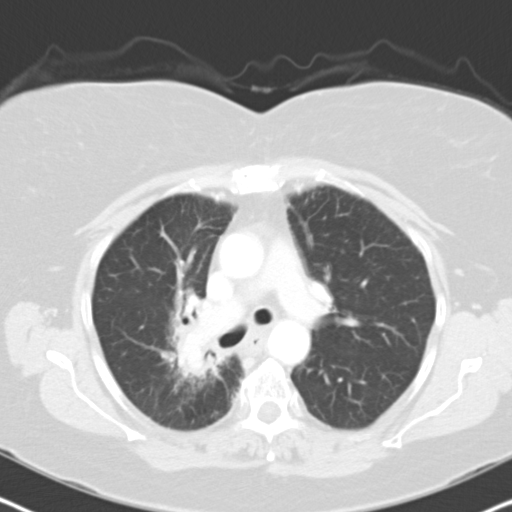
[im 39/56  mediastinal]
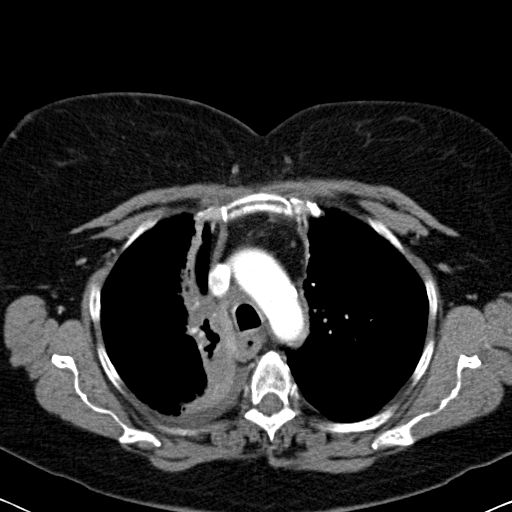
[im 39/56  lung]
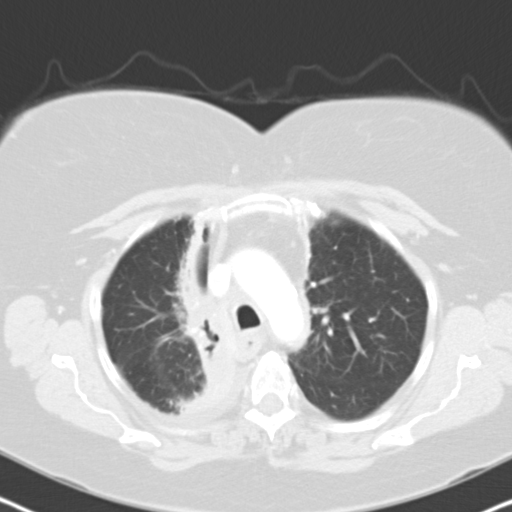
[im 43/56  lung]
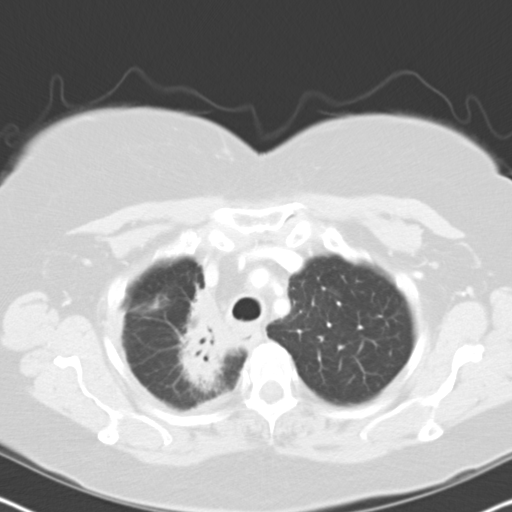
[im 47/56  lung]
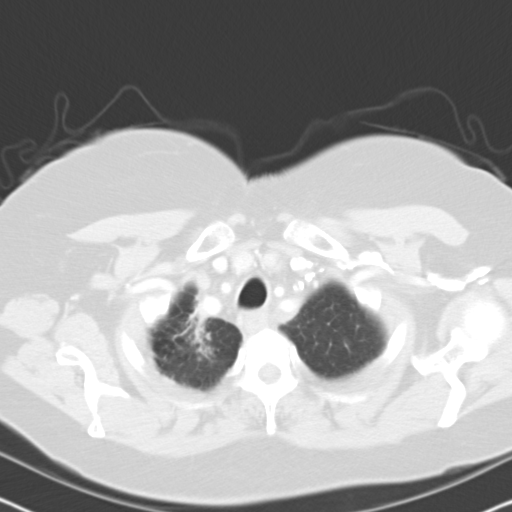
[im 51/56  lung]
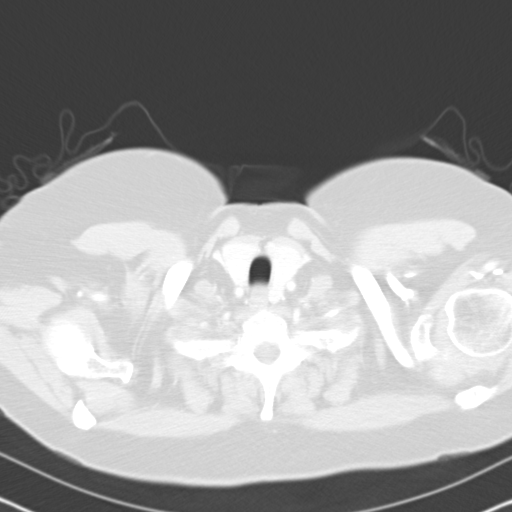

[Series 602: coronals · coronal · 0.63mm/px · 3 of 71 slices shown]
[im 15/71  lung]
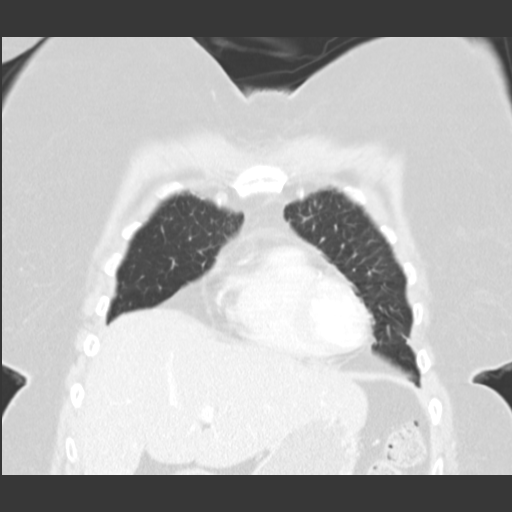
[im 29/71  lung]
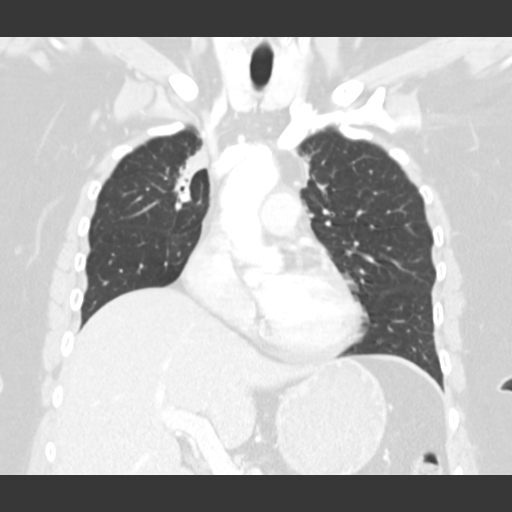
[im 43/71  lung]
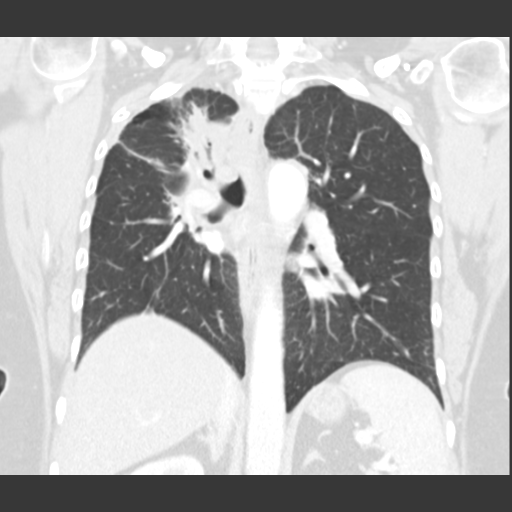

[15 of 36 positions shown; findings below may reference images not displayed]

FINDINGS: Sub centimeter nodules are again seen in the thyroid.  No
pathologically enlarged mediastinal, left hilar or axillary lymph
nodes.  Atherosclerotic calcification of the arterial vasculature.
Heart size normal.  No pericardial effusion.

Radiation fibrosis and volume loss in the upper right hemithorax,
as before.  A 4 mm nodule in the right lower lobe (image 30) is
unchanged from 02/21/2011. 2 mm left lower lobe nodule (image 36)
is also unchanged.  No pleural fluid.  Small amount of debris seen
dependently in the lower trachea.

Incidental imaging of the upper abdomen shows slightly decreased
attenuation throughout the visualized portion of the liver.  A tiny
blush of hyper attenuation is seen in the hepatic dome, as before,
and may represent a flash fill hemangioma or small perfusion
anomaly.  No worrisome lytic or sclerotic lesions.
IMPRESSION: 1.  Radiation fibrosis and volume loss in the upper right
hemithorax, stable.  No evidence of recurrent or metastatic
disease.
2.  Suspect hepatic steatosis.

## 2012-12-30 NOTE — Assessment & Plan Note (Signed)
Needs labs every 6 - 12 months

## 2012-12-30 NOTE — Assessment & Plan Note (Signed)
Needs labs every 6 months

## 2012-12-30 NOTE — Assessment & Plan Note (Signed)
She has not sxs She would likely benefit from lifestyle modification- She should exercise daily and concentrate on weight loss

## 2012-12-30 NOTE — Assessment & Plan Note (Signed)
She has regular f/u with oncology

## 2012-12-30 NOTE — Progress Notes (Signed)
patient comes in for followup of multiple medical problems including type 2 diabetes, hyperlipidemia, hypertension. The patient does not check blood sugar or blood pressure at home. The patetient does not follow an exercise or diet program. The patient denies any polyuria, polydipsia.  In the past the patient has gone to diabetic treatment center. The patient is tolerating medications  Without difficulty. The patient does admit to medication compliance.   Past Medical History  Diagnosis Date  . Diabetes mellitus   . GERD (gastroesophageal reflux disease)   . Lung cancer     non-small cell lung ca, stage III in 05/2011, s/p chemo/radiation  . COPD (chronic obstructive pulmonary disease)   . History of diverticulitis of colon   . CAD (coronary artery disease)     NON obstructive. Cath 2006 preserved LV fxn, scattered irregularities without critical stenosis. 2008 stress echo negative for ischemia, but with hypertensive response  . Hyperlipidemia   . Cancer of lung 12/24/2008    treated with radiation and chemotherapy    History   Social History  . Marital Status: Married    Spouse Name: N/A    Number of Children: N/A  . Years of Education: N/A   Occupational History  . Retired Engineer, petroleum    Social History Main Topics  . Smoking status: Former Smoker -- 1.00 packs/day for 45 years    Types: Cigarettes    Quit date: 10/24/2008  . Smokeless tobacco: Not on file  . Alcohol Use: No  . Drug Use: No  . Sexually Active: Not on file   Other Topics Concern  . Not on file   Social History Narrative   Lives in [place], has husband and daughter and son. Daughter's family is living with her and husband at present. She is ambulatory daily without cane or walker.     Past Surgical History  Procedure Laterality Date  . Appendectomy    . Abdominal hysterectomy  1997  . Carpal tunnel release      Family History  Problem Relation Age of Onset  . Heart failure Mother   . Kidney  disease Mother     renal failure  . Diabetes Mother   . Diabetes Father   . Diabetes Brother   . Heart disease Brother   . Heart attack Brother   . Heart attack Brother   . Heart attack Brother     Allergies  Allergen Reactions  . Lactose Intolerance (Gi) Diarrhea  . Ibuprofen     REACTION: Anxiousness, hypoventilate     Current Outpatient Prescriptions on File Prior to Visit  Medication Sig Dispense Refill  . aspirin 81 MG tablet Take 81 mg by mouth daily.      Marland Kitchen glucose blood (FREESTYLE LITE) test strip Use as instructed  100 each  12  . insulin NPH-insulin regular (NOVOLIN 70/30) (70-30) 100 UNIT/ML injection 25 units in a.m. And 22 units in the evening. Take with meals  60 mL  12  . Lancets (FREESTYLE) lancets Use as instructed  100 each  12  . loratadine (CLARITIN) 10 MG tablet Take 10 mg by mouth daily.        . nitroGLYCERIN (NITROSTAT) 0.4 MG SL tablet Place 0.4 mg under the tongue every 5 (five) minutes as needed. For chest pain      . simvastatin (ZOCOR) 10 MG tablet Take 10 mg by mouth at bedtime.       No current facility-administered medications on file prior to visit.  patient denies chest pain, shortness of breath, orthopnea. Denies lower extremity edema, abdominal pain, change in appetite, change in bowel movements. Patient denies rashes, musculoskeletal complaints. No other specific complaints in a complete review of systems.   BP 102/72  Temp(Src) 97.9 F (36.6 C) (Oral)  Wt 174 lb (78.926 kg)  BMI 31.82 kg/m2  well-developed well-nourished female in no acute distress. HEENT exam atraumatic, normocephalic, neck supple without jugular venous distention. Chest clear to auscultation cardiac exam S1-S2 are regular. Abdominal exam overweight with bowel sounds, soft and nontender. Extremities no edema. Neurologic exam is alert with a normal gait.

## 2012-12-31 ENCOUNTER — Encounter: Payer: Self-pay | Admitting: Internal Medicine

## 2012-12-31 ENCOUNTER — Ambulatory Visit (INDEPENDENT_AMBULATORY_CARE_PROVIDER_SITE_OTHER): Payer: Medicare Other | Admitting: Internal Medicine

## 2012-12-31 VITALS — BP 102/72 | Temp 97.9°F | Wt 174.0 lb

## 2012-12-31 DIAGNOSIS — E785 Hyperlipidemia, unspecified: Secondary | ICD-10-CM

## 2012-12-31 DIAGNOSIS — C3490 Malignant neoplasm of unspecified part of unspecified bronchus or lung: Secondary | ICD-10-CM

## 2012-12-31 DIAGNOSIS — E118 Type 2 diabetes mellitus with unspecified complications: Secondary | ICD-10-CM

## 2012-12-31 DIAGNOSIS — IMO0002 Reserved for concepts with insufficient information to code with codable children: Secondary | ICD-10-CM

## 2012-12-31 DIAGNOSIS — I2510 Atherosclerotic heart disease of native coronary artery without angina pectoris: Secondary | ICD-10-CM

## 2012-12-31 LAB — HEMOGLOBIN A1C: Hgb A1c MFr Bld: 7 % — ABNORMAL HIGH (ref 4.6–6.5)

## 2012-12-31 LAB — BASIC METABOLIC PANEL
BUN: 9 mg/dL (ref 6–23)
CO2: 27 mEq/L (ref 19–32)
Chloride: 107 mEq/L (ref 96–112)
Creatinine, Ser: 0.7 mg/dL (ref 0.4–1.2)

## 2012-12-31 LAB — HEPATIC FUNCTION PANEL
AST: 16 U/L (ref 0–37)
Alkaline Phosphatase: 84 U/L (ref 39–117)
Total Bilirubin: 0.6 mg/dL (ref 0.3–1.2)

## 2012-12-31 LAB — LIPID PANEL: Total CHOL/HDL Ratio: 6

## 2012-12-31 MED ORDER — NITROGLYCERIN 0.4 MG SL SUBL: 0.4000 mg | SUBLINGUAL_TABLET | SUBLINGUAL | Status: DC | PRN

## 2013-01-04 ENCOUNTER — Ambulatory Visit: Payer: Medicare Other | Admitting: Internal Medicine

## 2013-02-02 ENCOUNTER — Other Ambulatory Visit: Payer: Self-pay | Admitting: Internal Medicine

## 2013-02-03 ENCOUNTER — Emergency Department (HOSPITAL_COMMUNITY): Payer: Medicare Other

## 2013-02-03 ENCOUNTER — Encounter (HOSPITAL_COMMUNITY): Payer: Self-pay | Admitting: Emergency Medicine

## 2013-02-03 ENCOUNTER — Emergency Department (HOSPITAL_COMMUNITY)
Admission: EM | Admit: 2013-02-03 | Discharge: 2013-02-03 | Disposition: A | Discharge status: Home / Self Care | Payer: Medicare Other | Attending: Emergency Medicine | Admitting: Emergency Medicine

## 2013-02-03 DIAGNOSIS — K219 Gastro-esophageal reflux disease without esophagitis: Secondary | ICD-10-CM | POA: Insufficient documentation

## 2013-02-03 DIAGNOSIS — Z79899 Other long term (current) drug therapy: Secondary | ICD-10-CM | POA: Insufficient documentation

## 2013-02-03 DIAGNOSIS — R1013 Epigastric pain: Secondary | ICD-10-CM

## 2013-02-03 DIAGNOSIS — J449 Chronic obstructive pulmonary disease, unspecified: Secondary | ICD-10-CM | POA: Insufficient documentation

## 2013-02-03 DIAGNOSIS — J4489 Other specified chronic obstructive pulmonary disease: Secondary | ICD-10-CM | POA: Insufficient documentation

## 2013-02-03 DIAGNOSIS — Z8719 Personal history of other diseases of the digestive system: Secondary | ICD-10-CM | POA: Insufficient documentation

## 2013-02-03 DIAGNOSIS — Z794 Long term (current) use of insulin: Secondary | ICD-10-CM | POA: Insufficient documentation

## 2013-02-03 DIAGNOSIS — Z9889 Other specified postprocedural states: Secondary | ICD-10-CM | POA: Insufficient documentation

## 2013-02-03 DIAGNOSIS — Z90710 Acquired absence of both cervix and uterus: Secondary | ICD-10-CM | POA: Insufficient documentation

## 2013-02-03 DIAGNOSIS — I2510 Atherosclerotic heart disease of native coronary artery without angina pectoris: Secondary | ICD-10-CM | POA: Insufficient documentation

## 2013-02-03 DIAGNOSIS — R112 Nausea with vomiting, unspecified: Secondary | ICD-10-CM

## 2013-02-03 DIAGNOSIS — E119 Type 2 diabetes mellitus without complications: Secondary | ICD-10-CM | POA: Insufficient documentation

## 2013-02-03 DIAGNOSIS — Z87891 Personal history of nicotine dependence: Secondary | ICD-10-CM | POA: Insufficient documentation

## 2013-02-03 DIAGNOSIS — Z85118 Personal history of other malignant neoplasm of bronchus and lung: Secondary | ICD-10-CM | POA: Insufficient documentation

## 2013-02-03 DIAGNOSIS — Z7982 Long term (current) use of aspirin: Secondary | ICD-10-CM | POA: Insufficient documentation

## 2013-02-03 DIAGNOSIS — E785 Hyperlipidemia, unspecified: Secondary | ICD-10-CM | POA: Insufficient documentation

## 2013-02-03 LAB — LIPASE, BLOOD: Lipase: 36 U/L (ref 11–59)

## 2013-02-03 LAB — URINALYSIS, ROUTINE W REFLEX MICROSCOPIC
Bilirubin Urine: NEGATIVE
Glucose, UA: 100 mg/dL — AB
Ketones, ur: NEGATIVE mg/dL
Leukocytes, UA: NEGATIVE
Nitrite: NEGATIVE
Protein, ur: NEGATIVE mg/dL
Specific Gravity, Urine: 1.026 (ref 1.005–1.030)
Urobilinogen, UA: 1 mg/dL (ref 0.0–1.0)
pH: 5.5 (ref 5.0–8.0)

## 2013-02-03 LAB — URINE MICROSCOPIC-ADD ON

## 2013-02-03 LAB — COMPREHENSIVE METABOLIC PANEL WITH GFR
AST: 16 U/L (ref 0–37)
Albumin: 3.4 g/dL — ABNORMAL LOW (ref 3.5–5.2)
Alkaline Phosphatase: 105 U/L (ref 39–117)
CO2: 24 meq/L (ref 19–32)
Chloride: 105 meq/L (ref 96–112)
GFR calc non Af Amer: >90 mL/min (ref >90)
Potassium: 3.7 meq/L (ref 3.5–5.1)
Total Bilirubin: 0.3 mg/dL (ref 0.3–1.2)

## 2013-02-03 LAB — COMPREHENSIVE METABOLIC PANEL
ALT: 13 U/L (ref 0–35)
BUN: 10 mg/dL (ref 6–23)
Calcium: 9.1 mg/dL (ref 8.4–10.5)
Creatinine, Ser: 0.69 mg/dL (ref 0.50–1.10)
GFR calc Af Amer: >90 mL/min (ref >90)
Glucose, Bld: 202 mg/dL — ABNORMAL HIGH (ref 70–99)
Sodium: 139 mEq/L (ref 135–145)
Total Protein: 6.9 g/dL (ref 6.0–8.3)

## 2013-02-03 LAB — POCT I-STAT TROPONIN I
Troponin i, poc: 0 ng/mL (ref 0.00–0.08)
Troponin i, poc: 0 ng/mL (ref 0.00–0.08)

## 2013-02-03 LAB — CBC WITH DIFFERENTIAL/PLATELET
Basophils Absolute: 0 K/uL (ref 0.0–0.1)
Basophils Relative: 0 % (ref 0–1)
Eosinophils Absolute: 0.2 10*3/uL (ref 0.0–0.7)
Eosinophils Relative: 3 % (ref 0–5)
HCT: 39.6 % (ref 36.0–46.0)
Hemoglobin: 13.8 g/dL (ref 12.0–15.0)
Lymphocytes Relative: 24 % (ref 12–46)
Lymphs Abs: 1.2 10*3/uL (ref 0.7–4.0)
MCH: 31.2 pg (ref 26.0–34.0)
MCHC: 34.8 g/dL (ref 30.0–36.0)
MCV: 89.4 fL (ref 78.0–100.0)
Monocytes Absolute: 0.4 10*3/uL (ref 0.1–1.0)
Monocytes Relative: 8 % (ref 3–12)
Neutro Abs: 3.3 K/uL (ref 1.7–7.7)
Neutrophils Relative %: 65 % (ref 43–77)
Platelets: 164 10*3/uL (ref 150–400)
RBC: 4.43 MIL/uL (ref 3.87–5.11)
RDW: 14.1 % (ref 11.5–15.5)
WBC: 5.1 K/uL (ref 4.0–10.5)

## 2013-02-03 LAB — PROTIME-INR
INR: 0.97 (ref 0.00–1.49)
Prothrombin Time: 12.8 seconds (ref 11.6–15.2)

## 2013-02-03 LAB — APTT: aPTT: 30 seconds (ref 24–37)

## 2013-02-03 MED ORDER — PANTOPRAZOLE SODIUM 40 MG PO TBEC: 40.0000 mg | DELAYED_RELEASE_TABLET | Freq: Every day | ORAL | Status: DC

## 2013-02-03 MED ORDER — SODIUM CHLORIDE 0.9 % IV SOLN
Freq: Once | INTRAVENOUS | Status: AC
  Administered 2013-02-03: 09:00:00 via INTRAVENOUS

## 2013-02-03 MED ORDER — ONDANSETRON 8 MG PO TBDP: 8.0000 mg | ORAL_TABLET | Freq: Two times a day (BID) | ORAL | Status: DC | PRN

## 2013-02-03 MED ORDER — FENTANYL CITRATE 0.05 MG/ML IJ SOLN
50.0000 ug | Freq: Once | INTRAMUSCULAR | Status: AC
  Administered 2013-02-03: 50 ug via INTRAVENOUS
  Filled 2013-02-03: qty 2

## 2013-02-03 MED ORDER — PANTOPRAZOLE SODIUM 40 MG IV SOLR
40.0000 mg | Freq: Once | INTRAVENOUS | Status: AC
  Administered 2013-02-03: 40 mg via INTRAVENOUS
  Filled 2013-02-03: qty 40

## 2013-02-03 MED ORDER — ONDANSETRON HCL 4 MG/2ML IJ SOLN
INTRAMUSCULAR | Status: AC
  Filled 2013-02-03: qty 2

## 2013-02-03 MED ORDER — IOHEXOL 300 MG/ML  SOLN
100.0000 mL | Freq: Once | INTRAMUSCULAR | Status: AC | PRN
  Administered 2013-02-03: 100 mL via INTRAVENOUS

## 2013-02-03 MED ORDER — IOHEXOL 300 MG/ML  SOLN
50.0000 mL | Freq: Once | INTRAMUSCULAR | Status: AC | PRN
  Administered 2013-02-03: 50 mL via ORAL

## 2013-02-03 MED ORDER — ONDANSETRON HCL 4 MG/2ML IJ SOLN
4.0000 mg | Freq: Once | INTRAMUSCULAR | Status: AC
  Administered 2013-02-03: 4 mg via INTRAVENOUS

## 2013-02-03 NOTE — ED Notes (Signed)
Spoke with Phlebotomy re: I-stat troponin draw

## 2013-02-03 NOTE — ED Notes (Signed)
Pt in from home, pt c/o L & R Cp onset today 7:30, pt c/o SOB, nausea & vomiting x 1 this am, pt reports taking 2 nitro without relief, pt denies cardiac hx & states, "My doctor just gave me the nitro." pt A&O x 4, follows commands, speaks in complete sentences

## 2013-02-03 NOTE — ED Notes (Signed)
Patient transported to CT 

## 2013-02-03 NOTE — ED Provider Notes (Signed)
History     CSN: [id number]  Arrival date & time 02/03/13  [id number]   First MD Initiated Contact with Patient 02/03/13 [phone]      Chief Complaint  Patient presents with  . Chest Pain    (Consider location/radiation/quality/duration/timing/severity/associated sxs/prior treatment) HPI Comments: Patient presents from home after developing pain in her upper epigastrium in her anterior chest just as she began eating breakfast this morning. She has significant history of hypercholesterolemia, diabetes but no prior heart disease that she describes (h/o CAD in [facility] due to non obstructive CAD on cath in 2006) for which she took some nitroglycerin this morning which initially did not improve her symptoms. However at this point she reports her chest pain seems to be almost gone. She reports that she did get nauseated, did not vomit. She also has significant history of COPD from smoking as well as lung cancer. She did quit smoking the time she was diagnosed with cancer. She does not drink alcohol. She reports when she woke up this morning she was feeling fine. She reports no radiation of pain to her arms or jaw or back. She denies any diarrhea. She denies any fever or chills.  Patient is a 67 y.o. female presenting with chest pain. The history is provided by the patient and a relative.  Chest Pain Associated symptoms: abdominal pain and nausea   Associated symptoms: no back pain, no diaphoresis and no fever     Past Medical History  Diagnosis Date  . Diabetes mellitus   . GERD (gastroesophageal reflux disease)   . Lung cancer     non-small cell lung ca, stage III in 05/2011, s/p chemo/radiation  . COPD (chronic obstructive pulmonary disease)   . History of diverticulitis of colon   . CAD (coronary artery disease)     NON obstructive. Cath 2006 preserved LV fxn, scattered irregularities without critical stenosis. 2008 stress echo negative for ischemia, but with hypertensive response  . Hyperlipidemia    . Cancer of lung 12/24/2008    treated with radiation and chemotherapy    Past Surgical History  Procedure Laterality Date  . Appendectomy    . Abdominal hysterectomy  1997  . Carpal tunnel release      Family History  Problem Relation Age of Onset  . Heart failure Mother   . Kidney disease Mother     renal failure  . Diabetes Mother   . Diabetes Father   . Diabetes Brother   . Heart disease Brother   . Heart attack Brother   . Heart attack Brother   . Heart attack Brother     History  Substance Use Topics  . Smoking status: Former Smoker -- 1.00 packs/day for 45 years    Types: Cigarettes    Quit date: 10/24/2008  . Smokeless tobacco: Not on file  . Alcohol Use: No    OB History   Grav Para Term Preterm Abortions TAB SAB Ect Mult Living                  Review of Systems  Constitutional: Negative for fever, chills, diaphoresis and appetite change.  Cardiovascular: Positive for chest pain.  Gastrointestinal: Positive for nausea and abdominal pain.  Musculoskeletal: Negative for back pain.  All other systems reviewed and are negative.    Allergies  Lactose intolerance (gi) and Ibuprofen  Home Medications   Current Outpatient Rx  Name  Route  Sig  Dispense  Refill  . aspirin 81 MG tablet  Oral   Take 81 mg by mouth daily.         Marland Kitchen glucose blood (FREESTYLE LITE) test strip      Use as instructed   100 each   12   . insulin NPH-insulin regular (NOVOLIN 70/30) (70-30) 100 UNIT/ML injection      25 units in a.m. And 22 units in the evening. Take with meals   60 mL   12   . Lancets (FREESTYLE) lancets      Use as instructed   100 each   12   . loratadine (CLARITIN) 10 MG tablet   Oral   Take 10 mg by mouth daily.           . nitroGLYCERIN (NITROSTAT) 0.4 MG SL tablet   Sublingual   Place 1 tablet (0.4 mg total) under the tongue every 5 (five) minutes as needed. For chest pain   50 tablet   3   . simvastatin (ZOCOR) 10 MG tablet    Oral   Take 10 mg by mouth at bedtime.         . ondansetron (ZOFRAN-ODT) 8 MG disintegrating tablet   Oral   Take 1 tablet (8 mg total) by mouth every 12 (twelve) hours as needed for nausea.   20 tablet   0   . pantoprazole (PROTONIX) 40 MG tablet   Oral   Take 1 tablet (40 mg total) by mouth daily.   14 tablet   0     BP 99/63  Pulse 81  Temp(Src) 97.7 F (36.5 C) (Oral)  Resp 19  SpO2 98%  Physical Exam  Nursing note and vitals reviewed. Constitutional: She is oriented to person, place, and time. She appears well-developed and well-nourished. No distress.  HENT:  Head: Normocephalic and atraumatic.  Eyes: EOM are normal. Pupils are equal, round, and reactive to light. No scleral icterus.  Neck: Normal range of motion. Neck supple. No JVD present.  Cardiovascular: Normal rate and regular rhythm.   No murmur heard. Pulmonary/Chest: Effort normal. No respiratory distress. She has no wheezes. She has no rales.  Abdominal: Soft. Normal appearance. There is tenderness in the right upper quadrant, epigastric area and left upper quadrant. There is guarding. There is no rebound and no CVA tenderness.  Musculoskeletal: She exhibits no edema.  Neurological: She is alert and oriented to person, place, and time. Coordination normal.  Skin: Skin is warm and dry. She is not diaphoretic.  Psychiatric: She has a normal mood and affect.    ED Course  Procedures (including critical care time)  Labs Reviewed  COMPREHENSIVE METABOLIC PANEL - Abnormal; Notable for the following:    Glucose, Bld 202 (*)    Albumin 3.4 (*)    All other components within normal limits  URINALYSIS, ROUTINE W REFLEX MICROSCOPIC - Abnormal; Notable for the following:    Glucose, UA 100 (*)    Hgb urine dipstick TRACE (*)    All other components within normal limits  CBC WITH DIFFERENTIAL  LIPASE, BLOOD  APTT  PROTIME-INR  URINE MICROSCOPIC-ADD ON  POCT I-STAT TROPONIN I  POCT I-STAT TROPONIN I    US Abdomen Complete  02/03/2013  *RADIOLOGY REPORT*  Clinical Data:  Abdominal pain.  Prior cholecystectomy.  History of lung cancer.  COMPLETE ABDOMINAL ULTRASOUND  Comparison:  CT abdomen pelvis dated 05/28/2012  Findings:  Gallbladder:  Surgically absent.  Common bile duct:  Measures 7 mm.  Liver:  Coarse, echogenic hepatic parenchyma with  mildly nodular hepatic contour.  No focal hepatic lesion is seen.  IVC:  Appears normal.  Pancreas:  Incompletely visualized but grossly unremarkable.  Spleen:  Measures 6.1 cm.  Right Kidney:  Measures 10.0 cm.  No mass or hydronephrosis.  Left Kidney:  Measures 10.0 cm.  No mass or hydronephrosis.  Abdominal aorta:  No aneurysm identified.  IMPRESSION: Status post cholecystectomy.  Suspected hepatic steatosis.  Early mild cirrhosis is possible.   Original Report Authenticated By: Charline Bills, M.D.    Ct Abdomen Pelvis W Contrast  02/03/2013  *RADIOLOGY REPORT*  Clinical Data: Abdominal pain, nausea/vomiting  CT ABDOMEN AND PELVIS WITH CONTRAST  Technique:  Multidetector CT imaging of the abdomen and pelvis was performed following the standard protocol during bolus administration of intravenous contrast.  Contrast: OMNIPAQUE IOHEXOL 300 MG/ML  SOLN  Comparison: 05/28/2012  Findings: Lung bases are clear.  9 mm hypervascular lesion in the anterior hepatic dome (series 2/image 11), grossly unchanged, likely reflecting a flash filling hemangioma.  Mildly nodular contour of the left hepatic lobe (series 2/image 22), nonspecific.  Spleen, pancreas, and adrenal glands are within normal limits.  Status post cholecystectomy.  No intrahepatic or extrahepatic ductal dilatation.  Kidneys are within normal limits.  No hydronephrosis.  No evidence of bowel obstruction.  Normal appendix.  Colonic diverticulosis, without associated inflammatory changes.  Atherosclerotic calcifications of the abdominal aorta and branch vessels.  No abdominopelvic ascites.  No suspicious  abdominopelvic lymphadenopathy.  Status post hysterectomy.  No adnexal masses.  No ureteral or bladder calculi.  Bladder is underdistended.  Degenerative changes of the visualized thoracolumbar spine.  IMPRESSION: Normal appendix.  No evidence of bowel obstruction.  No CT findings to account for the patient's abdominal pain.   Original Report Authenticated By: Charline Bills, M.D.    Dg Chest Port 1 View  02/03/2013  *RADIOLOGY REPORT*  Clinical Data: Chest pain, cough  PORTABLE CHEST - 1 VIEW  Comparison: CT chest dated 08/17/2012  Findings: Opacity / fibrosis in the medial right upper lobe, likely reflecting radiation changes when correlating the prior CT.  Volume loss in the right hemithorax.  No pleural effusion or pneumothorax.  The heart is normal in size.  IMPRESSION: No evidence of acute cardiopulmonary disease.  Suspected radiation changes in the medial right upper lobe.   Original Report Authenticated By: Charline Bills, M.D.      1. Epigastric abdominal pain   2. Nausea and vomiting in adult     Room air saturation is 97% I interpret this to be normal.  EKG at time 08:23, shows sinus rhythm at a rate of 91, normal axis, poor R wave progression between leads V2 and V3, no ST or T-wave abnormalities. QT interval is borderline prolonged with a QTC of 487 ms. EKG is not significantly changed compared to 05/26/2012. Interpretation is abn ECG without acute significant changes.     10:43 AM Patient had an episode of vomiting. IV Zofran ordered. The patient and her family member recalls that the patient had a cholecystectomy. Ultrasound is unremarkable other than possible fatty liver. Given the patient is still symptomatic, will obtain a abdominal and pelvic CT scan. Possibly bowel obstruction is on the differential.  1:52 PM 2 sets of troponins are negative. Patient has no chest pain and is symptom-free at this time. I feel okay that the patient can be discharged home provided she has  close followup. The patient and her daughter are in agreement and the patient can followup  with Dr. Cato Mulligan this week. She can certainly return if symptoms return, changes in her symptoms or other concerns develop.  MDM   Patient is clearly tender in her upper epigastric, right and left upper abdomen. This makes it much less likely that this is coronary disease. EKG shows no acute ischemia. Will obtain a troponin here in emergency department, but I suspect biliary colic is more likely etiology of her symptoms. We'll get routine labs including LFTs, lipase and will obtain a abdominal ultrasound. Otherwise we'll give IV fluids, keep n.p.o., IV antiemetics and analgesics as needed.        Gavin Pound. Kaithlyn Teagle, MD 02/03/13 1355

## 2013-02-03 NOTE — ED Notes (Signed)
Patient transported to Ultrasound 

## 2013-02-05 ENCOUNTER — Encounter: Payer: Self-pay | Admitting: Family Medicine

## 2013-02-05 ENCOUNTER — Ambulatory Visit (INDEPENDENT_AMBULATORY_CARE_PROVIDER_SITE_OTHER): Payer: Medicare Other | Admitting: Family Medicine

## 2013-02-05 VITALS — BP 110/68 | Temp 98.5°F | Wt 181.0 lb

## 2013-02-05 DIAGNOSIS — R0789 Other chest pain: Secondary | ICD-10-CM

## 2013-02-05 DIAGNOSIS — K219 Gastro-esophageal reflux disease without esophagitis: Secondary | ICD-10-CM

## 2013-02-05 MED ORDER — INSULIN NPH ISOPHANE & REGULAR (70-30) 100 UNIT/ML ~~LOC~~ SUSP: 22.0000 [IU] | Freq: Two times a day (BID) | SUBCUTANEOUS | Status: DC

## 2013-02-05 MED ORDER — PANTOPRAZOLE SODIUM 40 MG PO TBEC: 40.0000 mg | DELAYED_RELEASE_TABLET | Freq: Every day | ORAL | Status: DC

## 2013-02-05 NOTE — Progress Notes (Signed)
Subjective:    Patient ID: [name], female    DOB: 01/19/1946, 67 y.o.   MRN: [id number]  HPI Patient seen for emergency room followup. Her chronic problems include history of type 2 diabetes, obesity, COPD, lung cancer, and GERD. She presented to emergency department 02/04/2012. She's had pain in her epigastric region radiating to anterior chest after eating breakfast. She apparently described as a some chest tightness. They gave her nitroglycerin at home which did not help much. She has some nausea but no vomiting. Emergency department no acute EKG changes. Troponins negative. Abdominal ultrasound revealed previous cholecystectomy. Suspected hepatic steatosis. CT abdomen and pelvis no acute findings. Chest x-ray no acute findings. EKG showed no ST or T wave changes. Labs including CBC, Lipase normal.  Patient had coronary angiography back in August 2013 with no significant coronary artery disease. Patient does have history of lung cancer as above. This was 3 years ago. She denies any pleuritic pain. No hemoptysis. No tachycardia. No significant dyspnea at this time. No recent peripheral edema.  Patient has been on protonix in the past but has not taken for some time.  Past Medical History  Diagnosis Date  . Diabetes mellitus   . GERD (gastroesophageal reflux disease)   . Lung cancer     non-small cell lung ca, stage III in 05/2011, s/p chemo/radiation  . COPD (chronic obstructive pulmonary disease)   . History of diverticulitis of colon   . CAD (coronary artery disease)     NON obstructive. Cath 2006 preserved LV fxn, scattered irregularities without critical stenosis. 2008 stress echo negative for ischemia, but with hypertensive response  . Hyperlipidemia   . Cancer of lung 12/24/2008    treated with radiation and chemotherapy   Past Surgical History  Procedure Laterality Date  . Appendectomy    . Abdominal hysterectomy  1997  . Carpal tunnel release      reports that she  quit smoking about 4 years ago. Her smoking use included Cigarettes. She has a 45 pack-year smoking history. She does not have any smokeless tobacco history on file. She reports that she does not drink alcohol or use illicit drugs. family history includes Diabetes in her brother, father, and mother; Heart attack in her brothers; Heart disease in her brother; Heart failure in her mother; and Kidney disease in her mother. Allergies  Allergen Reactions  . Lactose Intolerance (Gi) Diarrhea  . Ibuprofen     REACTION: Anxiousness, hypoventilate   '    Review of Systems  Constitutional: Positive for fatigue. Negative for chills, appetite change and unexpected weight change.  HENT: Negative for trouble swallowing.   Respiratory: Negative for cough.   Cardiovascular: Positive for chest pain. Negative for palpitations and leg swelling.  Gastrointestinal: Positive for abdominal pain. Negative for nausea, vomiting, diarrhea and blood in stool.  Genitourinary: Negative for dysuria.  Neurological: Negative for dizziness and syncope.       Objective:   Physical Exam  Constitutional: She appears well-developed and well-nourished. No distress.  Cardiovascular: Normal rate and regular rhythm.   No murmur heard. Pulmonary/Chest: Effort normal and breath sounds normal. No respiratory distress. She has no wheezes. She has no rales.  Abdominal: Soft.  Mild tenderness epigastric region. No guarding or rebound and  Musculoskeletal: She exhibits no edema.          Assessment & Plan:  Recent atypical chest pain. Patient presented with epigastric abdominal pain. Previous cholecystectomy. Reported history of hiatal hernia. Suspect is related  to esophageal reflux, esophageal spasm, or other GI etiology. Recent coronary angiography as above with no obstructive coronary disease. Get back on Protonix 40 mg once daily. Needs to schedule followup with primary to reassess diabetes

## 2013-02-05 NOTE — Patient Instructions (Addendum)
Get back on Pantoprazole one daily

## 2013-02-18 ENCOUNTER — Encounter (HOSPITAL_COMMUNITY): Payer: Self-pay

## 2013-02-18 ENCOUNTER — Other Ambulatory Visit (HOSPITAL_BASED_OUTPATIENT_CLINIC_OR_DEPARTMENT_OTHER): Payer: Medicare Other | Admitting: Lab

## 2013-02-18 ENCOUNTER — Ambulatory Visit (HOSPITAL_COMMUNITY)
Admission: RE | Admit: 2013-02-18 | Discharge: 2013-02-18 | Disposition: A | Discharge status: Home / Self Care | Payer: Medicare Other | Source: Ambulatory Visit | Attending: Internal Medicine | Admitting: Internal Medicine

## 2013-02-18 DIAGNOSIS — C3490 Malignant neoplasm of unspecified part of unspecified bronchus or lung: Secondary | ICD-10-CM

## 2013-02-18 DIAGNOSIS — C3410 Malignant neoplasm of upper lobe, unspecified bronchus or lung: Secondary | ICD-10-CM

## 2013-02-18 LAB — COMPREHENSIVE METABOLIC PANEL (CC13)
Albumin: 3.6 g/dL (ref 3.5–5.0)
Alkaline Phosphatase: 112 U/L (ref 40–150)
BUN: 12.5 mg/dL (ref 7.0–26.0)
CO2: 27 mEq/L (ref 22–29)
Glucose: 143 mg/dl — ABNORMAL HIGH (ref 70–99)
Potassium: 4.2 mEq/L (ref 3.5–5.1)
Sodium: 141 mEq/L (ref 136–145)
Total Bilirubin: 0.37 mg/dL (ref 0.20–1.20)
Total Protein: 7.1 g/dL (ref 6.4–8.3)

## 2013-02-18 LAB — CBC WITH DIFFERENTIAL/PLATELET
Basophils Absolute: 0 10*3/uL (ref 0.0–0.1)
Eosinophils Absolute: 0.1 10*3/uL (ref 0.0–0.5)
HGB: 14.1 g/dL (ref 11.6–15.9)
MCV: 91.1 fL (ref 79.5–101.0)
MONO#: 0.6 10*3/uL (ref 0.1–0.9)
NEUT#: 3.7 10*3/uL (ref 1.5–6.5)
RBC: 4.51 10*6/uL (ref 3.70–5.45)
RDW: 15.1 % — ABNORMAL HIGH (ref 11.2–14.5)
WBC: 5.8 10*3/uL (ref 3.9–10.3)

## 2013-02-18 MED ORDER — IOHEXOL 300 MG/ML  SOLN
80.0000 mL | Freq: Once | INTRAMUSCULAR | Status: AC | PRN
  Administered 2013-02-18: 80 mL via INTRAVENOUS

## 2013-02-20 ENCOUNTER — Encounter: Payer: Self-pay | Admitting: Internal Medicine

## 2013-02-20 ENCOUNTER — Ambulatory Visit (HOSPITAL_BASED_OUTPATIENT_CLINIC_OR_DEPARTMENT_OTHER): Payer: Medicare Other | Admitting: Internal Medicine

## 2013-02-20 ENCOUNTER — Telehealth: Payer: Self-pay | Admitting: Internal Medicine

## 2013-02-20 VITALS — BP 119/68 | HR 96 | Temp 97.9°F | Resp 20 | Ht 62.0 in | Wt 180.3 lb

## 2013-02-20 DIAGNOSIS — Z85118 Personal history of other malignant neoplasm of bronchus and lung: Secondary | ICD-10-CM

## 2013-02-20 DIAGNOSIS — C3490 Malignant neoplasm of unspecified part of unspecified bronchus or lung: Secondary | ICD-10-CM

## 2013-02-20 NOTE — Telephone Encounter (Signed)
gv and printed appt sched and avs for pt for OCT. °

## 2013-02-20 NOTE — Patient Instructions (Signed)
No evidence for disease recurrence on the recent scan.  Followup visit in 6 months with repeat CT scan of the chest. 

## 2013-02-20 NOTE — Progress Notes (Signed)
[facility] [phone]   [phone]  OFFICE PROGRESS NOTE  [name], MD [address] [place] [address]  PRINCIPAL DIAGNOSIS: Limited stage small cell lung cancer diagnosed in March of 2010.   PRIOR THERAPY:  1. Status post 4 cycles of systemic chemotherapy with carboplatin and etoposide concurrent with radiation during cycle 2 and 3. The last dose of chemotherapy was given April 01, 2009. 2. Status post prophylactic cranial irradiation under the care of Dr. Mitzi Hansen completed May 19, 2009.  CURRENT THERAPY: Observation.  INTERVAL HISTORY: [name] 67 y.o. female returns to the clinic today for routine six-month followup visit. The patient is feeling fine today with no specific complaints. She had a recent CT scan of the abdomen and pelvis for evaluation of nausea and vomiting and this was found to be related to her hiatal hernia. The patient denied having any significant chest pain, shortness breath, cough or hemoptysis. She had repeat CT scan of the chest performed recently and she is here for evaluation and discussion of her scan results.  MEDICAL HISTORY: Past Medical History  Diagnosis Date  . Diabetes mellitus   . GERD (gastroesophageal reflux disease)   . Lung cancer     non-small cell lung ca, stage III in 05/2011, s/p chemo/radiation  . COPD (chronic obstructive pulmonary disease)   . History of diverticulitis of colon   . CAD (coronary artery disease)     NON obstructive. Cath 2006 preserved LV fxn, scattered irregularities without critical stenosis. 2008 stress echo negative for ischemia, but with hypertensive response  . Hyperlipidemia   . Cancer of lung 12/24/2008    treated with radiation and chemotherapy    ALLERGIES:  is allergic to lactose intolerance (gi) and ibuprofen.  MEDICATIONS:  Current Outpatient Prescriptions  Medication Sig Dispense Refill  . aspirin 81 MG tablet Take 81 mg by mouth  daily.      Marland Kitchen glucose blood (FREESTYLE LITE) test strip Use as instructed  100 each  12  . insulin NPH-regular (NOVOLIN 70/30) (70-30) 100 UNIT/ML injection Inject 22-25 Units into the skin 2 (two) times daily with a meal. Inject 25 units in the morning and 22 units in the evening with meals  10 mL  0  . Lancets (FREESTYLE) lancets Use as instructed  100 each  12  . loratadine (CLARITIN) 10 MG tablet Take 10 mg by mouth daily.        Marland Kitchen NOVOLIN 70/30 RELION (70-30) 100 UNIT/ML injection INJECT 25 UNITS SUBCUTANEOUSLY IN THE MORNING AND 22 UNITS IN THE EVENING. TAKE WITH MEALS  60 mL  0  . pantoprazole (PROTONIX) 40 MG tablet Take 1 tablet (40 mg total) by mouth daily.  30 tablet  3  . simvastatin (ZOCOR) 10 MG tablet Take 10 mg by mouth at bedtime.      . nitroGLYCERIN (NITROSTAT) 0.4 MG SL tablet Place 1 tablet (0.4 mg total) under the tongue every 5 (five) minutes as needed. For chest pain  50 tablet  3  . ondansetron (ZOFRAN-ODT) 8 MG disintegrating tablet Take 1 tablet (8 mg total) by mouth every 12 (twelve) hours as needed for nausea.  20 tablet  0   No current facility-administered medications for this visit.    SURGICAL HISTORY:  Past Surgical History  Procedure Laterality Date  . Appendectomy    . Abdominal hysterectomy  1997  . Carpal tunnel release      REVIEW OF  SYSTEMS:  A comprehensive review of systems was negative.   PHYSICAL EXAMINATION: General appearance: alert, cooperative and no distress Head: Normocephalic, without obvious abnormality, atraumatic Neck: no adenopathy Resp: clear to auscultation bilaterally Cardio: regular rate and rhythm, S1, S2 normal, no murmur, click, rub or gallop GI: soft, non-tender; bowel sounds normal; no masses,  no organomegaly Extremities: extremities normal, atraumatic, no cyanosis or edema  ECOG PERFORMANCE STATUS: 0 - Asymptomatic  Blood pressure 119/68, pulse 96, temperature 97.9 F (36.6 C), temperature source Oral, resp. rate 20,  height 5\' 2"  (1.575 m), weight 180 lb 4.8 oz (81.784 kg).  LABORATORY DATA: Lab Results  Component Value Date   WBC 5.8 02/18/2013   HGB 14.1 02/18/2013   HCT 41.1 02/18/2013   MCV 91.1 02/18/2013   PLT 181 02/18/2013      Chemistry      Component Value Date/Time   NA 141 02/18/2013 0812   NA 139 02/03/2013 0830   NA 141 02/16/2012 0803   K 4.2 02/18/2013 0812   K 3.7 02/03/2013 0830   K 3.9 02/16/2012 0803   CL 105 02/18/2013 0812   CL 105 02/03/2013 0830   CL 99 02/16/2012 0803   CO2 27 02/18/2013 0812   CO2 24 02/03/2013 0830   CO2 28 02/16/2012 0803   BUN 12.5 02/18/2013 0812   BUN 10 02/03/2013 0830   BUN 12 02/16/2012 0803   CREATININE 0.8 02/18/2013 0812   CREATININE 0.69 02/03/2013 0830   CREATININE 0.9 02/16/2012 0803      Component Value Date/Time   CALCIUM 9.3 02/18/2013 0812   CALCIUM 9.1 02/03/2013 0830   CALCIUM 8.7 02/16/2012 0803   ALKPHOS 112 02/18/2013 0812   ALKPHOS 105 02/03/2013 0830   ALKPHOS 75 02/16/2012 0803   AST 15 02/18/2013 0812   AST 16 02/03/2013 0830   AST 20 02/16/2012 0803   ALT 15 02/18/2013 0812   ALT 13 02/03/2013 0830   BILITOT 0.37 02/18/2013 0812   BILITOT 0.3 02/03/2013 0830   BILITOT 0.70 02/16/2012 0803       RADIOGRAPHIC STUDIES: Ct Chest W Contrast  02/18/2013  *RADIOLOGY REPORT*  Clinical Data: Lung cancer  CT CHEST WITH CONTRAST  Technique:  Multidetector CT imaging of the chest was performed following the standard protocol during bolus administration of intravenous contrast.  Contrast: 80mL OMNIPAQUE IOHEXOL 300 MG/ML  SOLN 08/17/2012  Comparison: 08/17/2012  Findings: There is no axillary lymphadenopathy.  A band of soft tissue attenuation in the medial right upper lung is stable and remains compatible with postradiation change.  The abnormal soft tissue attenuation and effacing the right main stem bronchus and upper lobe airways is also unchanged.  No left hilar lymphadenopathy.  Heart size is stable.  Trace pericardial fluid or thickening is  unchanged.  There is no pleural effusion.  Lung windows show no new or progressive disease in the right lung. There is some probable atelectasis in the base of the lingula.  No new parenchymal nodule or mass.  Bone windows reveal no worrisome lytic or sclerotic osseous lesions. No adrenal lesions.  The tiny hypervascular focus in the anterior liver dome is stable.  IMPRESSION: Stable exam.  No new or progressive disease to suggest recurrent neoplasm.   Original Report Authenticated By: Kennith Center, M.D.    US Abdomen Complete  02/03/2013  *RADIOLOGY REPORT*  Clinical Data:  Abdominal pain.  Prior cholecystectomy.  History of lung cancer.  COMPLETE ABDOMINAL ULTRASOUND  Comparison:  CT abdomen  pelvis dated 05/28/2012  Findings:  Gallbladder:  Surgically absent.  Common bile duct:  Measures 7 mm.  Liver:  Coarse, echogenic hepatic parenchyma with mildly nodular hepatic contour.  No focal hepatic lesion is seen.  IVC:  Appears normal.  Pancreas:  Incompletely visualized but grossly unremarkable.  Spleen:  Measures 6.1 cm.  Right Kidney:  Measures 10.0 cm.  No mass or hydronephrosis.  Left Kidney:  Measures 10.0 cm.  No mass or hydronephrosis.  Abdominal aorta:  No aneurysm identified.  IMPRESSION: Status post cholecystectomy.  Suspected hepatic steatosis.  Early mild cirrhosis is possible.   Original Report Authenticated By: Charline Bills, M.D.    Ct Abdomen Pelvis W Contrast  02/03/2013  *RADIOLOGY REPORT*  Clinical Data: Abdominal pain, nausea/vomiting  CT ABDOMEN AND PELVIS WITH CONTRAST  Technique:  Multidetector CT imaging of the abdomen and pelvis was performed following the standard protocol during bolus administration of intravenous contrast.  Contrast: OMNIPAQUE IOHEXOL 300 MG/ML  SOLN  Comparison: 05/28/2012  Findings: Lung bases are clear.  9 mm hypervascular lesion in the anterior hepatic dome (series 2/image 11), grossly unchanged, likely reflecting a flash filling hemangioma.  Mildly  nodular contour of the left hepatic lobe (series 2/image 22), nonspecific.  Spleen, pancreas, and adrenal glands are within normal limits.  Status post cholecystectomy.  No intrahepatic or extrahepatic ductal dilatation.  Kidneys are within normal limits.  No hydronephrosis.  No evidence of bowel obstruction.  Normal appendix.  Colonic diverticulosis, without associated inflammatory changes.  Atherosclerotic calcifications of the abdominal aorta and branch vessels.  No abdominopelvic ascites.  No suspicious abdominopelvic lymphadenopathy.  Status post hysterectomy.  No adnexal masses.  No ureteral or bladder calculi.  Bladder is underdistended.  Degenerative changes of the visualized thoracolumbar spine.  IMPRESSION: Normal appendix.  No evidence of bowel obstruction.  No CT findings to account for the patient's abdominal pain.   Original Report Authenticated By: Charline Bills, M.D.    Dg Chest Port 1 View  02/03/2013  *RADIOLOGY REPORT*  Clinical Data: Chest pain, cough  PORTABLE CHEST - 1 VIEW  Comparison: CT chest dated 08/17/2012  Findings: Opacity / fibrosis in the medial right upper lobe, likely reflecting radiation changes when correlating the prior CT.  Volume loss in the right hemithorax.  No pleural effusion or pneumothorax.  The heart is normal in size.  IMPRESSION: No evidence of acute cardiopulmonary disease.  Suspected radiation changes in the medial right upper lobe.   Original Report Authenticated By: Charline Bills, M.D.     ASSESSMENT: This is a very pleasant 67 years old white female with history of limited stage small cell lung cancer status post systemic chemotherapy concurrent with radiation followed by prophylactic cranial irradiation and has been observation since July of 2010 with no evidence for disease recurrence.   PLAN: I discussed the scan results with the patient and her husband. I recommended for her to continue on observation with repeat CT scan of the chest in 6  months.  She was advised to call immediately if she has any concerning symptoms in the interval.  All questions were answered. The patient knows to call the clinic with any problems, questions or concerns. We can certainly see the patient much sooner if necessary.

## 2013-02-20 NOTE — Addendum Note (Signed)
Addended by: Si Gaul on: 02/20/2013 10:24 AM   Modules accepted: Orders

## 2013-02-21 ENCOUNTER — Telehealth: Payer: Self-pay | Admitting: Internal Medicine

## 2013-02-21 MED ORDER — INSULIN ISOPHANE & REGULAR (HUMAN 70-30)100 UNIT/ML KWIKPEN: 25.0000 [IU] | PEN_INJECTOR | Freq: Every morning | SUBCUTANEOUS | Status: DC

## 2013-02-21 NOTE — Telephone Encounter (Signed)
Pt hus said novolin 70/30 is covered on there INS at higher cost 25.00 a bottle. Pt hus would like something else prescribed for his wife that is cheaper call into [facility]  [facility] [address]

## 2013-02-21 NOTE — Telephone Encounter (Signed)
New rx sent in for humulin, ok per Dr Cato Mulligan, pharmacy will notify pt

## 2013-02-21 NOTE — Telephone Encounter (Signed)
Have him talk with pharmacy or insurance-- what is the preferred insulin?

## 2013-02-21 NOTE — Telephone Encounter (Signed)
Husband is calling and states that he called this morning for a different insulin for patient because insurance doesn't cover Novlin 70/30. Husband states that the new insulin called in today still cost him $55.00.  He did buy this today but he can not continue to afford this insulin.  Office please review and call pt regarding  Insulin costs.  Denies any sx at this time.

## 2013-02-22 NOTE — Telephone Encounter (Signed)
Pt aware.

## 2013-03-29 IMAGING — CR DG CHEST 2V
2 series · 2 of 2 positions shown · non-contrast
Comparison: Chest 01/21/2012.  CT chest 02/16/2012.

CLINICAL DATA: Sudden onset numbness and tingling in the left arm
and hand with chest pain.

CHEST - 2 VIEW

[w chest pa]
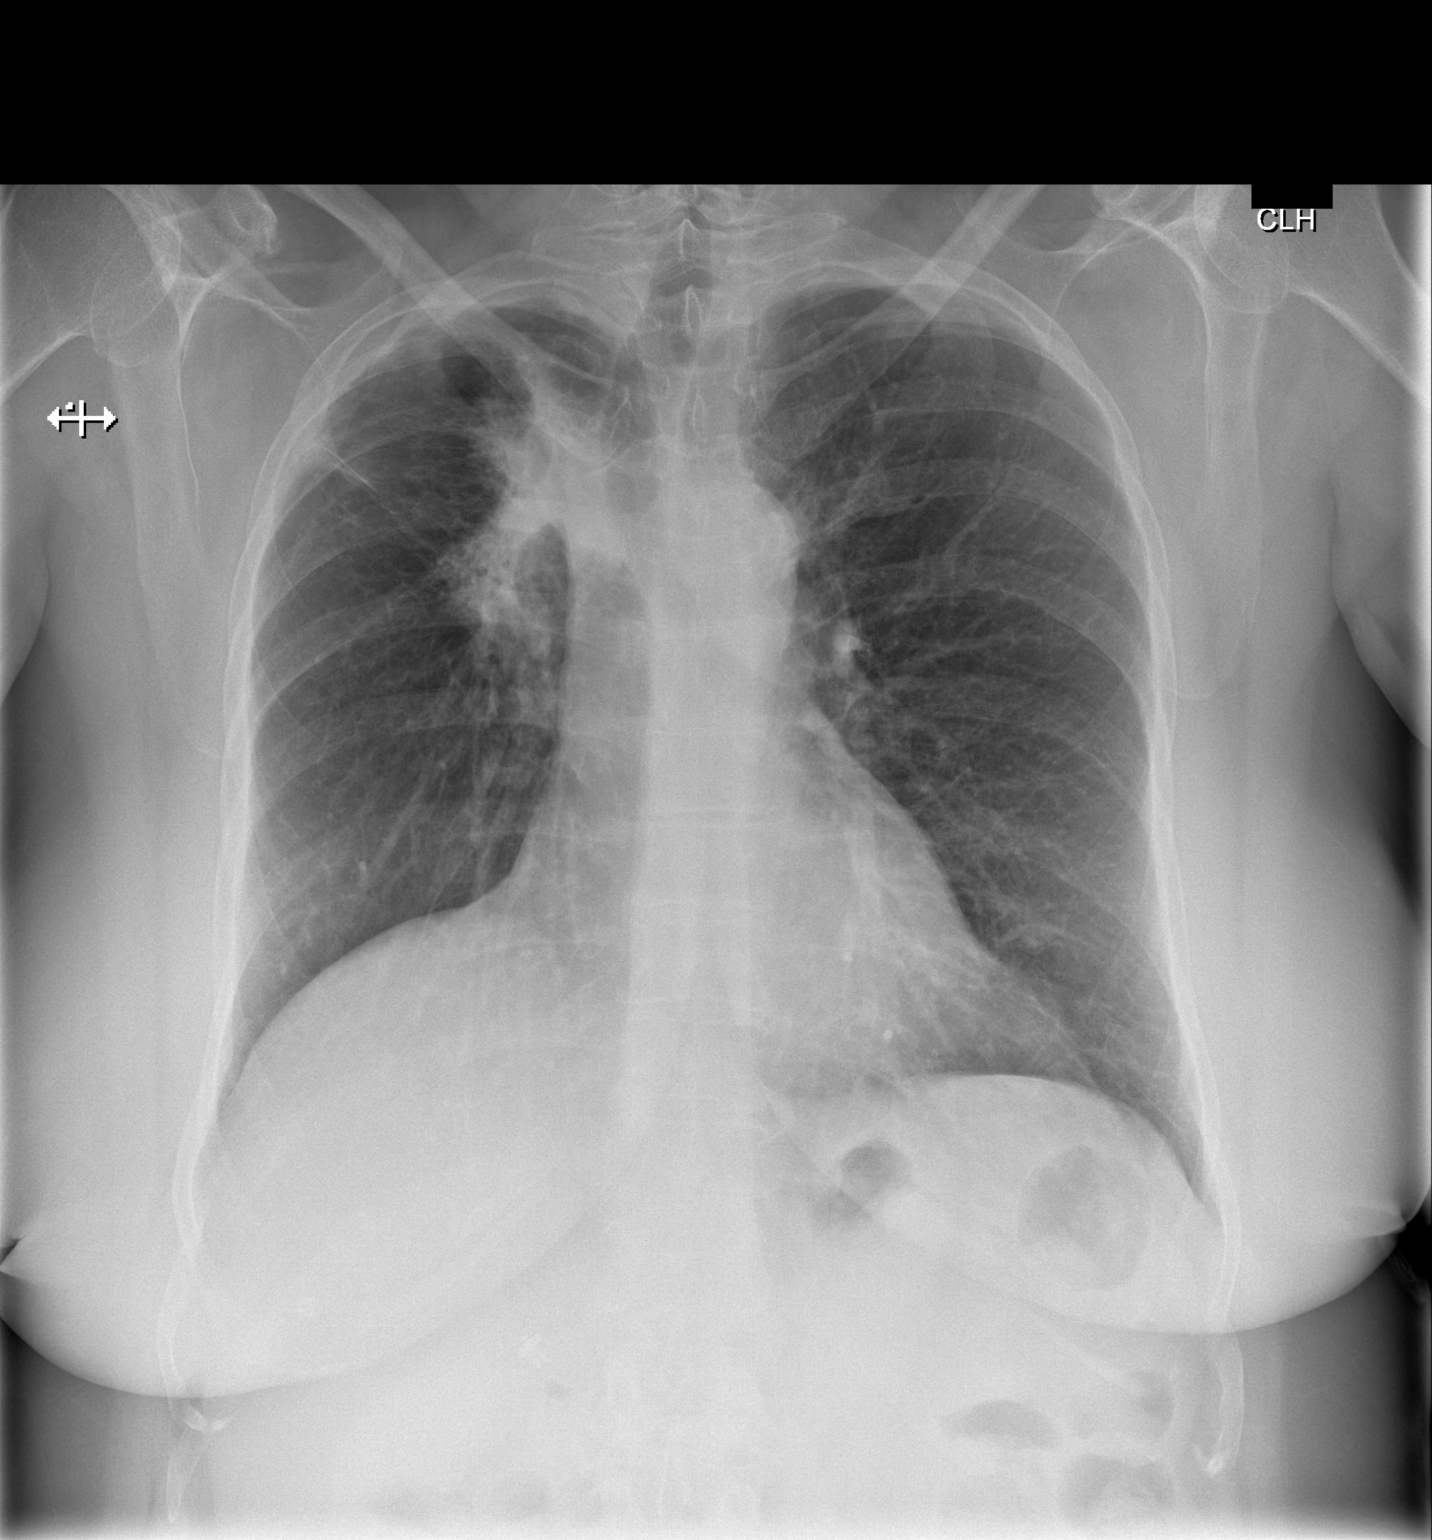

[w chest lat]
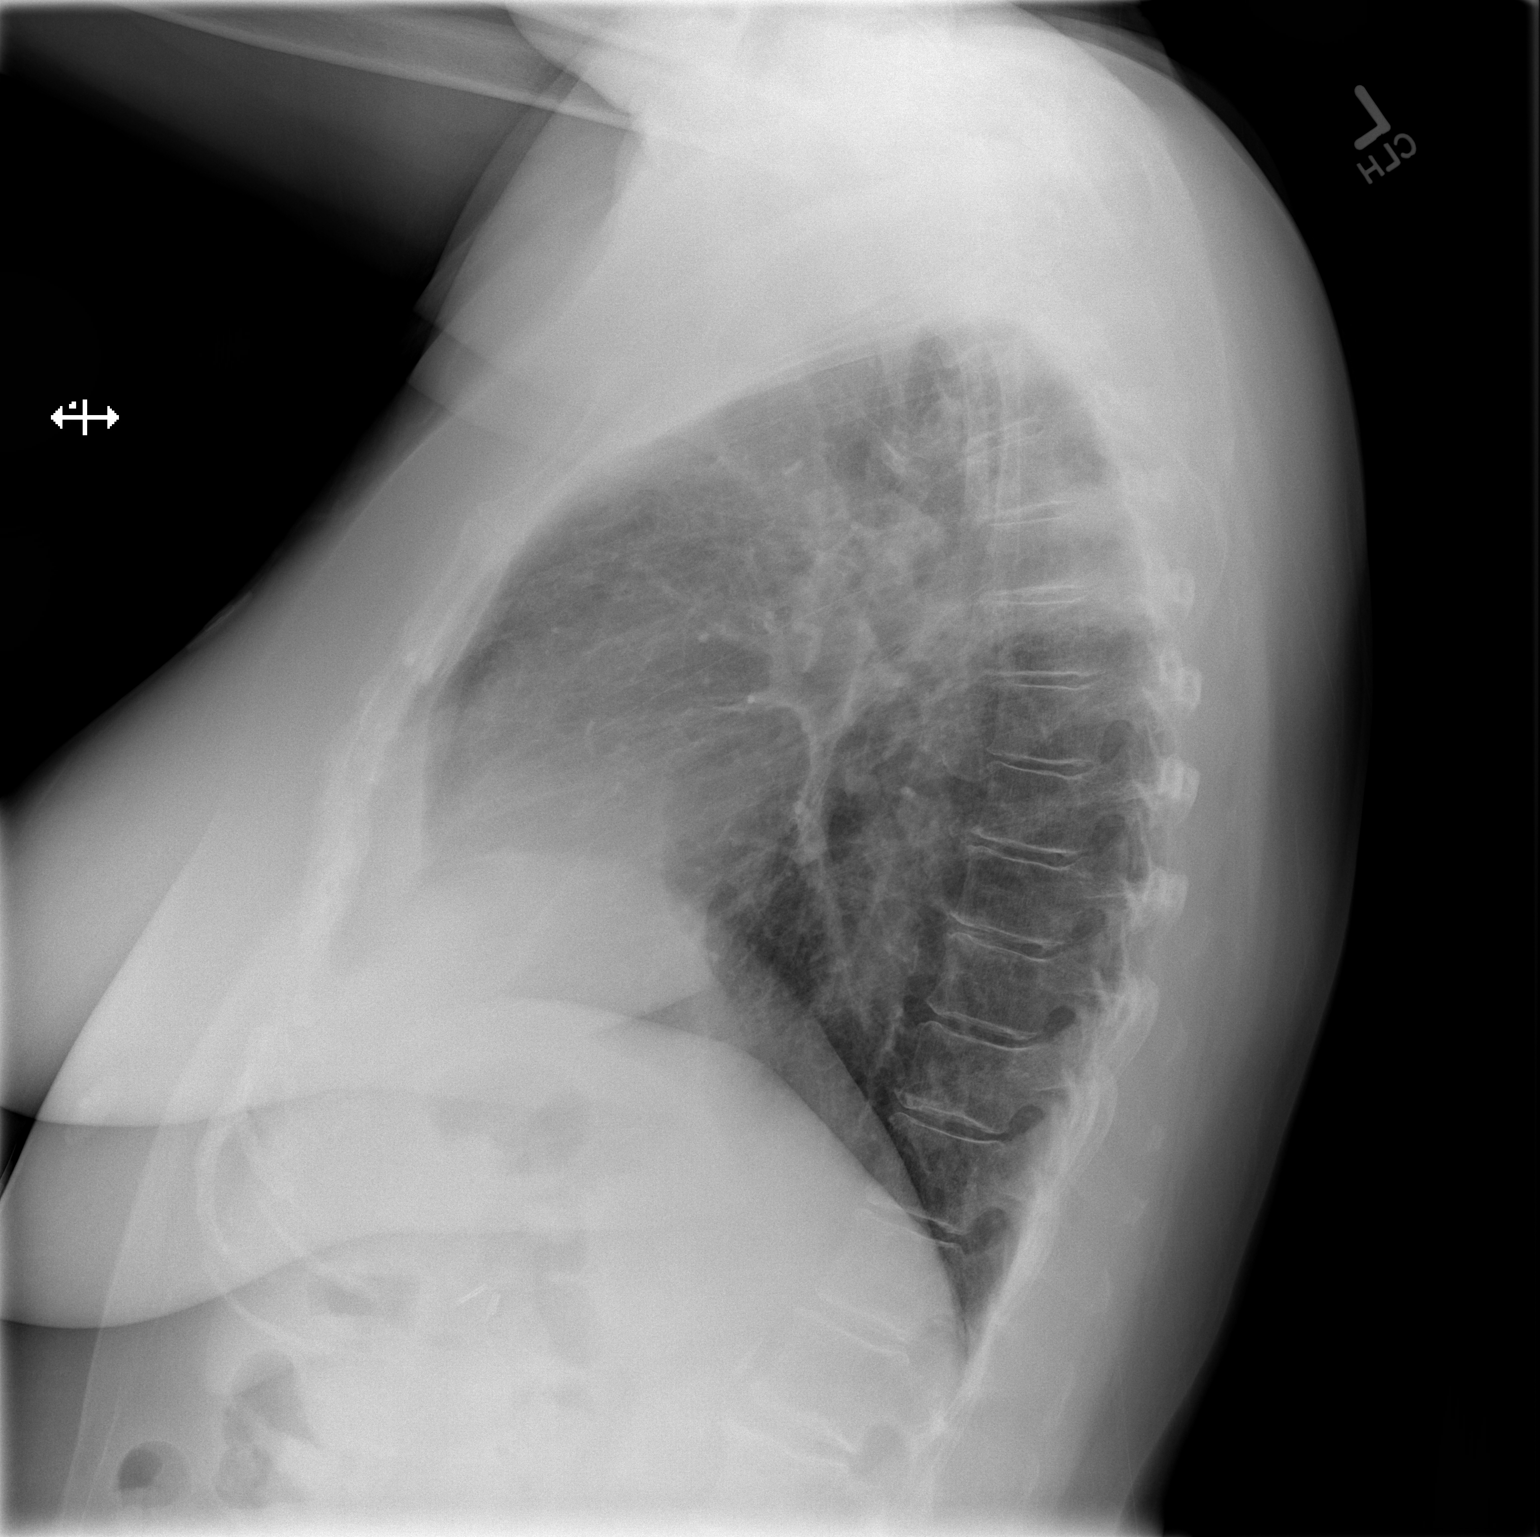

[2 of 2 positions shown; findings below may reference images not displayed]

FINDINGS: Volume loss, scarring, and architectural distortion in
the right upper lung suggesting resection and / or radiation
change.  Findings appear stable since the previous study.  No
evidence of developing density or mass.  Normal heart size and
pulmonary vascularity.  No focal airspace consolidation.  No
blunting of costophrenic angles.  No pneumothorax.
IMPRESSION: Volume loss, scarring, and architectural distortion in the right
upper lung likely representing postoperative/post radiation change.
No significant change since previous study.  No active
infiltration.

## 2013-03-31 IMAGING — CT CT ABD-PELV W/ CM
2 of 5 series · 16 of 46 positions shown, 18 images · IV contrast (water/omni  & 100ml omni 300)
Comparison: 10/02/2011 and 03/30/2010 and 09/16/2005

CLINICAL DATA: Nausea and vomiting. Lung cancer

CT ABDOMEN AND PELVIS WITH CONTRAST
TECHNIQUE: Multidetector CT imaging of the abdomen and pelvis was
performed following the standard protocol during bolus
administration of intravenous contrast.
Contrast: 100mL OMNIPAQUE IOHEXOL 300 MG/ML  SOLN

[Series 2: routine abdomen · axial · 0.79mm/px · z∈[-408,+2]mm · 13 of 94 slices shown, 15 images]
[im 6/94  soft-tissue]
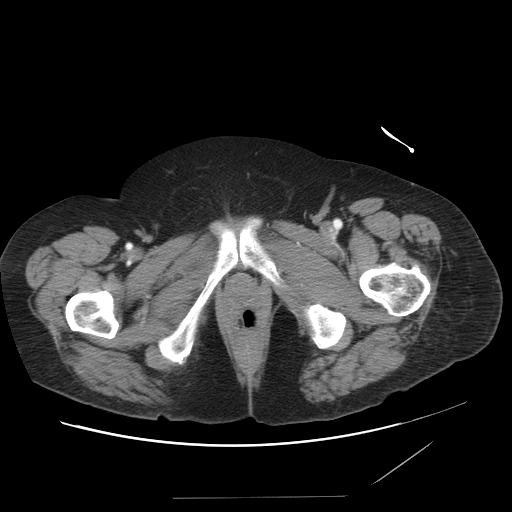
[im 6/94  bone]
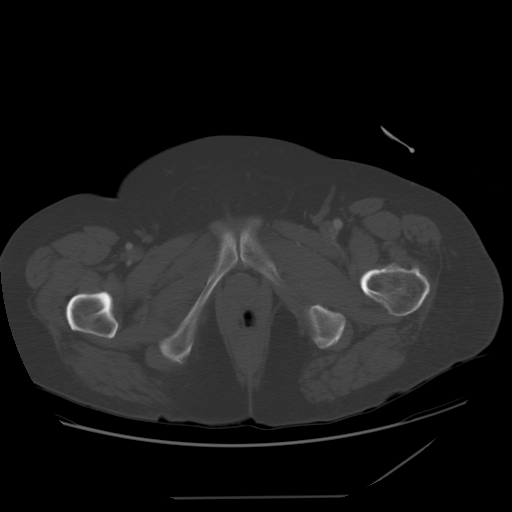
[im 11/94  soft-tissue]
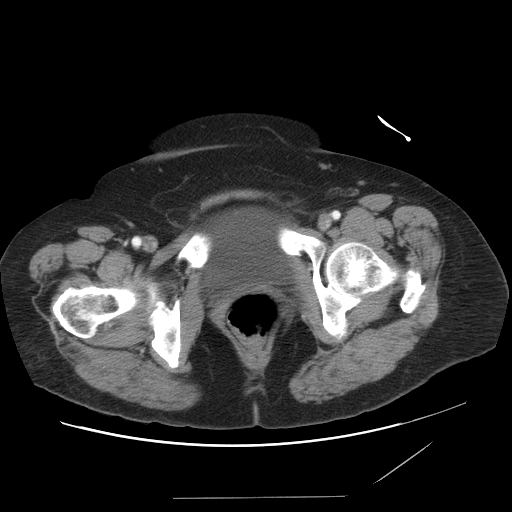
[im 21/94  soft-tissue]
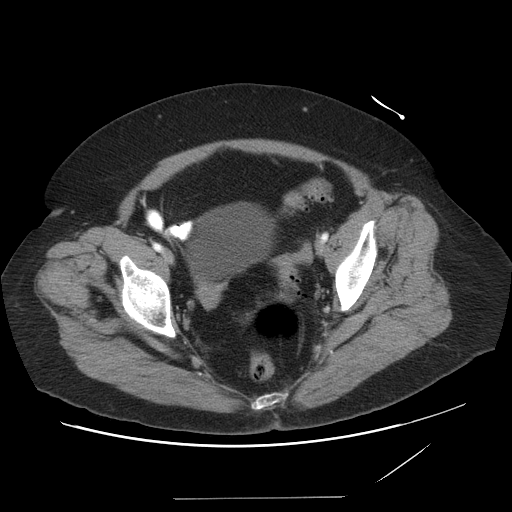
[im 26/94  soft-tissue]
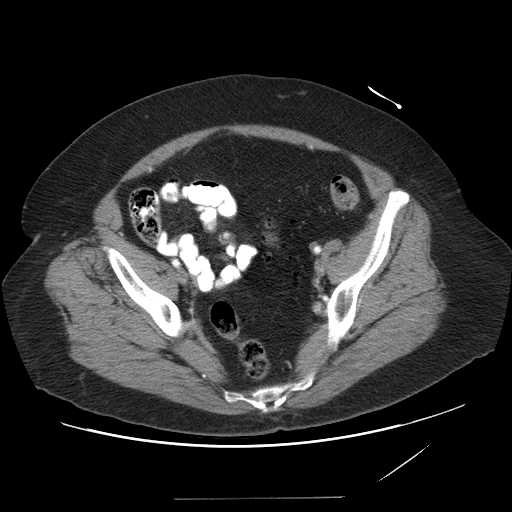
[im 32/94  soft-tissue]
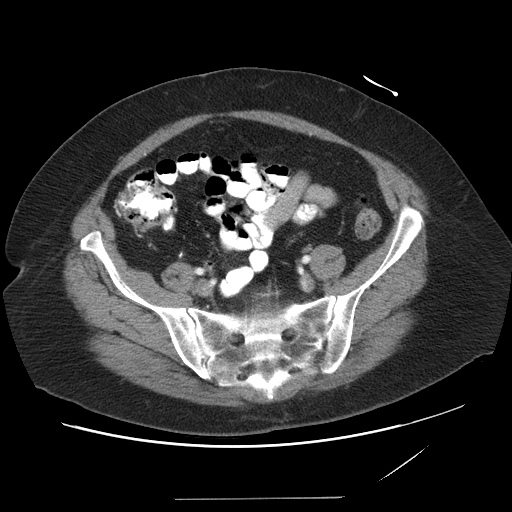
[im 42/94  soft-tissue]
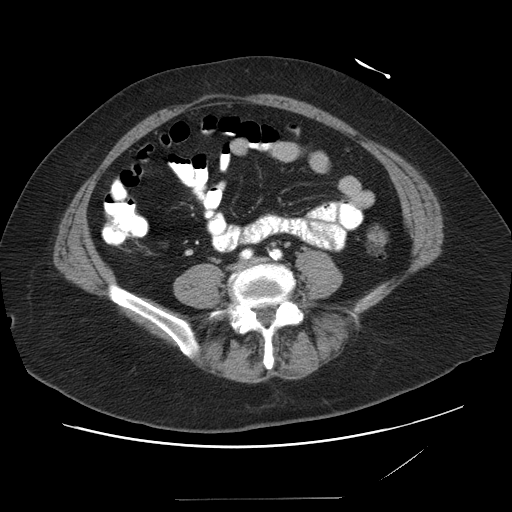
[im 47/94  soft-tissue]
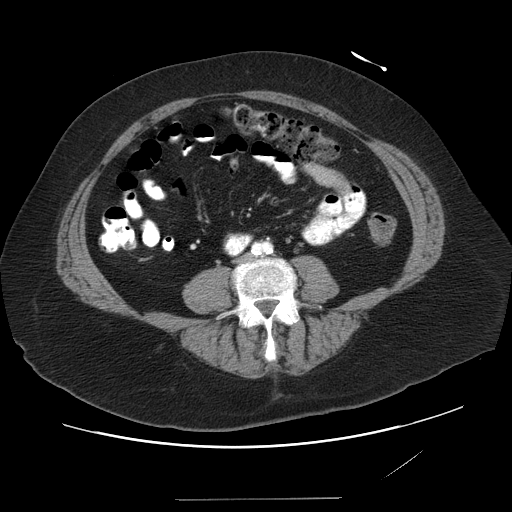
[im 52/94  soft-tissue]
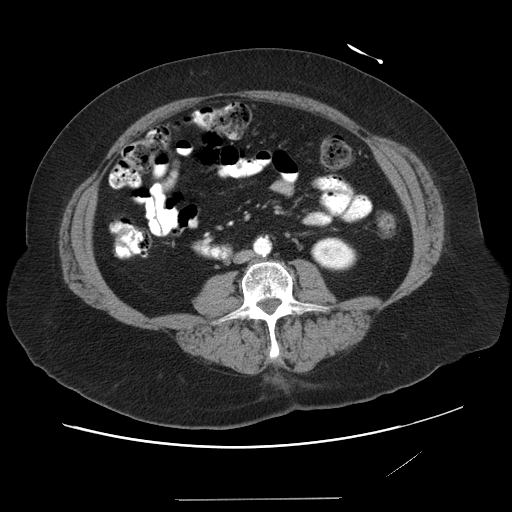
[im 63/94  soft-tissue]
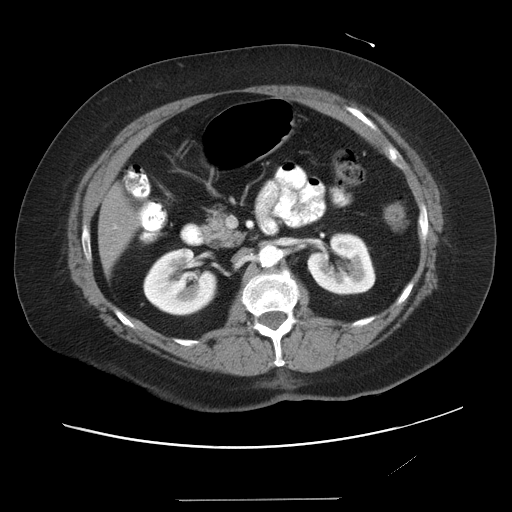
[im 63/94  bone]
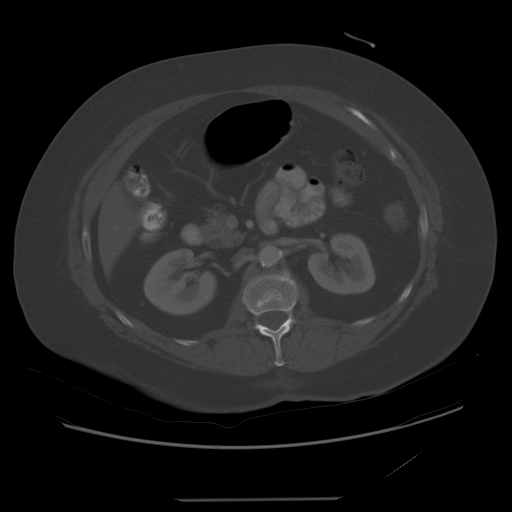
[im 68/94  soft-tissue]
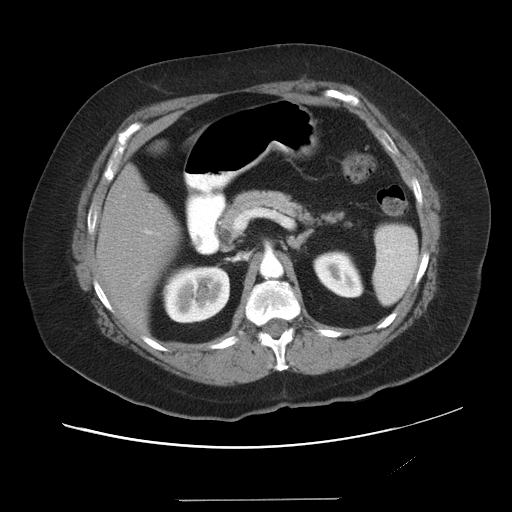
[im 73/94  soft-tissue]
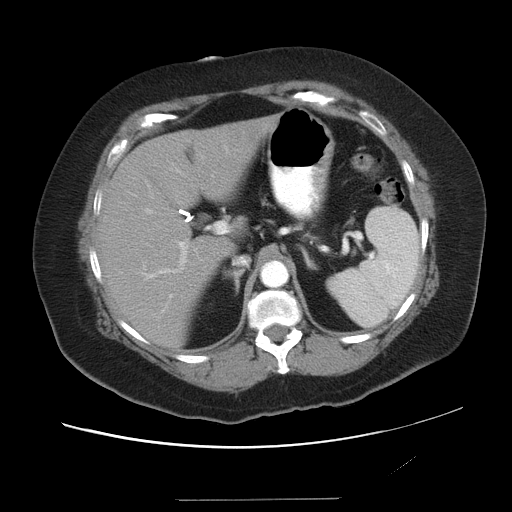
[im 83/94  soft-tissue]
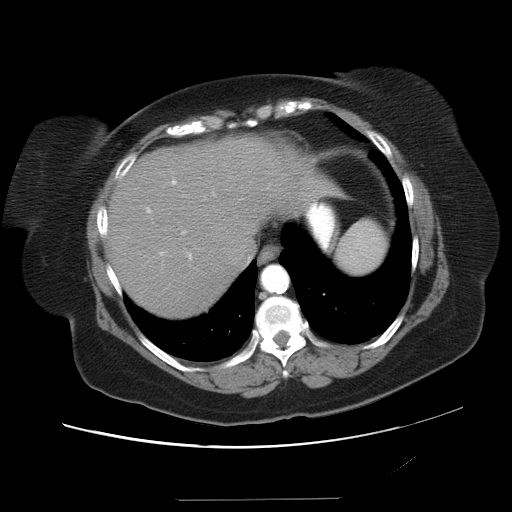
[im 88/94  soft-tissue]
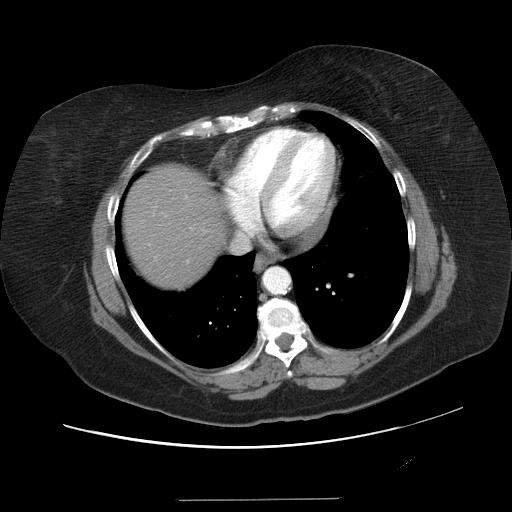

[Series 401: coronal · coronal · 0.93mm/px · 3 of 104 slices shown]
[im 35/104  soft-tissue]
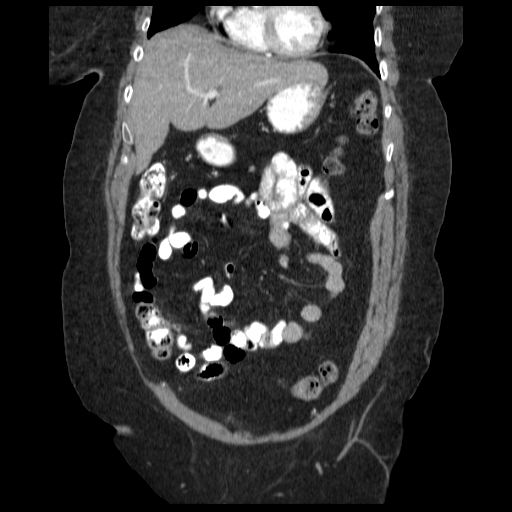
[im 46/104  soft-tissue]
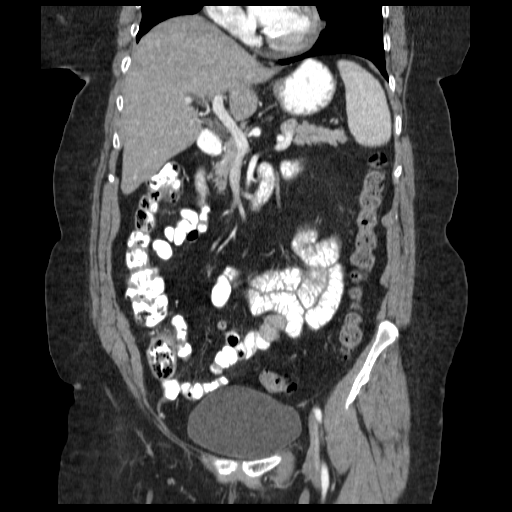
[im 58/104  soft-tissue]
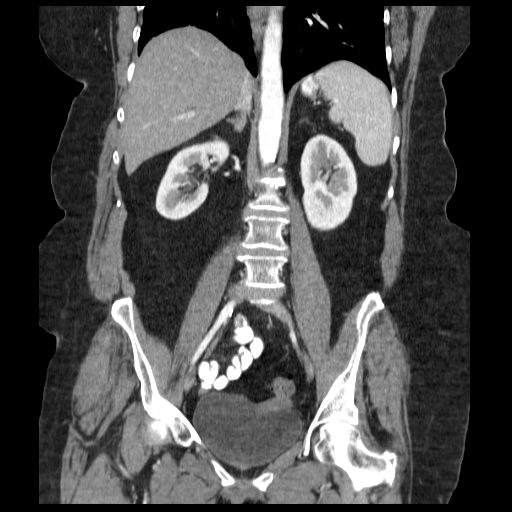

[16 of 46 positions shown; findings below may reference images not displayed]

FINDINGS: 7 mm enhancing focus is identified in the anterior
hepatic dome (image 10).  Fatty infiltration of the liver is
evident.  The spleen, stomach, duodenum, pancreas, and adrenal
glands are unremarkable.  The gallbladder is surgically absent.
Tiny cortical defects in the left kidney are too small to
characterize but are unchanged likely represent tiny cortical cyst.
The right kidney is unremarkable.

No abdominal aortic aneurysm.  There is no free fluid or
lymphadenopathy in the abdomen.  Abdominal bowel loops are
unremarkable.

Imaging through the pelvis shows no free intraperitoneal fluid.
There is no pelvic sidewall lymphadenopathy.  The bladder is
unremarkable.  Uterus is surgically absent.  No adnexal mass.

Diverticular changes are seen in the sigmoid colon without
diverticulitis.  The terminal ileum and the appendix are normal.

Bone windows reveal no worrisome lytic or sclerotic osseous
lesions.
IMPRESSION: No acute findings.  No findings to explain the patient's history of
nausea and vomiting.

7 mm enhancing focus in the anterior hepatic dome. While this has
not been visualized on any of the recent abdominal CTs, it has a
similar appearance to a chest CT from January 2012 and was also
visualized on a chest CT from January 2011.  This lesion is very
subtle, but visible on a chest CT from 10/20/2010. The differential
visualization is almost certainly attributable to slight variations
in bolus timing.  Given the tiny size and the fact that it was
visible on a study from nearly 2 years ago, it is unlikely to
represent a metastatic deposit.  Continued attention to this area
on follow-up imaging is recommended.

Sigmoid diverticulosis without diverticulitis.

## 2013-04-11 ENCOUNTER — Telehealth: Payer: Self-pay | Admitting: *Deleted

## 2013-04-11 NOTE — Telephone Encounter (Signed)
Pt's family member called requesting a letter to excuse her from jury duty.  She has short term memory issues and frequent urination.  Per Dr Donnald Garre, last chemotherapy was 2010 so from a medical oncology side, we would not write an excuse letter.  Advised she contact Dr Joellen Jersey office or PCP.  Spoke with pt and pt's husband.  SLJ

## 2013-04-12 ENCOUNTER — Encounter: Payer: Self-pay | Admitting: Radiation Oncology

## 2013-04-15 ENCOUNTER — Telehealth: Payer: Self-pay | Admitting: *Deleted

## 2013-04-15 NOTE — Telephone Encounter (Signed)
On 04-15-13 mail letter to pt .

## 2013-04-18 ENCOUNTER — Ambulatory Visit (INDEPENDENT_AMBULATORY_CARE_PROVIDER_SITE_OTHER): Payer: Medicare Other | Admitting: Family Medicine

## 2013-04-18 ENCOUNTER — Encounter: Payer: Self-pay | Admitting: Family Medicine

## 2013-04-18 VITALS — BP 120/78 | HR 96 | Temp 98.1°F | Wt 178.0 lb

## 2013-04-18 DIAGNOSIS — J309 Allergic rhinitis, unspecified: Secondary | ICD-10-CM

## 2013-04-18 MED ORDER — FLUTICASONE PROPIONATE 50 MCG/ACT NA SUSP: 2.0000 | Freq: Every day | NASAL | Status: DC

## 2013-04-18 NOTE — Progress Notes (Signed)
Chief Complaint  Patient presents with  . Cough    HPI:  Acute visit for cough and PND: -started a few weeks to a few months ago - occurs every spring with the pollen, sneezing, watery and itchy eyes, PND is her primary concern -reports allergies, takes zyrtec -denies: fevers, SOB, wheezing, acid reflux  ROS: See pertinent positives and negatives per HPI.  Past Medical History  Diagnosis Date  . Diabetes mellitus   . GERD (gastroesophageal reflux disease)   . Lung cancer     non-small cell lung ca, stage III in 05/2011, s/p chemo/radiation  . COPD (chronic obstructive pulmonary disease)   . History of diverticulitis of colon   . CAD (coronary artery disease)     NON obstructive. Cath 2006 preserved LV fxn, scattered irregularities without critical stenosis. 2008 stress echo negative for ischemia, but with hypertensive response  . Hyperlipidemia   . Cancer of lung 12/24/2008    treated with radiation and chemotherapy    Family History  Problem Relation Age of Onset  . Heart failure Mother   . Kidney disease Mother     renal failure  . Diabetes Mother   . Diabetes Father   . Diabetes Brother   . Heart disease Brother   . Heart attack Brother   . Heart attack Brother   . Heart attack Brother     History   Social History  . Marital Status: Married    Spouse Name: N/A    Number of Children: N/A  . Years of Education: N/A   Occupational History  . Retired Engineer, petroleum    Social History Main Topics  . Smoking status: Former Smoker -- 1.00 packs/day for 45 years    Types: Cigarettes    Quit date: 10/24/2008  . Smokeless tobacco: None  . Alcohol Use: No  . Drug Use: No  . Sexually Active: None   Other Topics Concern  . None   Social History Narrative   Lives in [place], has husband and daughter and son. Daughter's family is living with her and husband at present. She is ambulatory daily without cane or walker.     Current outpatient  prescriptions:aspirin 81 MG tablet, Take 81 mg by mouth daily., Disp: , Rfl: ;  glucose blood (FREESTYLE LITE) test strip, Use as instructed, Disp: 100 each, Rfl: 12;  Insulin Isophane & Regular (HUMULIN 70/30 KWIKPEN) (70-30) 100 UNIT/ML SUPN, Inject 25 Units into the skin every morning. And 22 units in the evening with meals, Disp: 3 mL, Rfl: 5;  Lancets (FREESTYLE) lancets, Use as instructed, Disp: 100 each, Rfl: 12 loratadine (CLARITIN) 10 MG tablet, Take 10 mg by mouth daily.  , Disp: , Rfl: ;  nitroGLYCERIN (NITROSTAT) 0.4 MG SL tablet, Place 1 tablet (0.4 mg total) under the tongue every 5 (five) minutes as needed. For chest pain, Disp: 50 tablet, Rfl: 3;  ondansetron (ZOFRAN-ODT) 8 MG disintegrating tablet, Take 1 tablet (8 mg total) by mouth every 12 (twelve) hours as needed for nausea., Disp: 20 tablet, Rfl: 0 pantoprazole (PROTONIX) 40 MG tablet, Take 1 tablet (40 mg total) by mouth daily., Disp: 30 tablet, Rfl: 3;  simvastatin (ZOCOR) 10 MG tablet, Take 10 mg by mouth at bedtime., Disp: , Rfl: ;  fluticasone (FLONASE) 50 MCG/ACT nasal spray, Place 2 sprays into the nose daily., Disp: 16 g, Rfl: 6  EXAM:  Filed Vitals:   04/18/13 1046  BP: 120/78  Pulse: 96  Temp: 98.1 F (36.7 C)  Body mass index is 32.55 kg/(m^2).  GENERAL: vitals reviewed and listed above, alert, oriented, appears well hydrated and in no acute distress  HEENT: atraumatic, conjunttiva clear, no obvious abnormalities on inspection of external nose and ears, normal appearance of ear canals and TMs, clear nasal congestion and boggy turbinates, mild post oropharyngeal erythema with PND, no tonsillar edema or exudate, no sinus TTP  NECK: no obvious masses on inspection  LUNGS: clear to auscultation bilaterally, no wheezes, rales or rhonchi, good air movement  CV: HRRR, no peripheral edema  MS: moves all extremities without noticeable abnormality  PSYCH: pleasant and cooperative, no obvious depression or  anxiety  ASSESSMENT AND PLAN:  Discussed the following assessment and plan:  Allergic rhinitis - Plan: fluticasone (FLONASE) 50 MCG/ACT nasal spray  -adding INS for the PND -continue zyrtec -follow up as needed  -Patient advised to return or notify a doctor immediately if symptoms worsen or persist or new concerns arise.  There are no Patient Instructions on file for this visit.   Kriste Basque R.

## 2013-04-22 ENCOUNTER — Other Ambulatory Visit: Payer: Self-pay | Admitting: Internal Medicine

## 2013-05-10 ENCOUNTER — Telehealth: Payer: Self-pay | Admitting: Internal Medicine

## 2013-05-10 DIAGNOSIS — E118 Type 2 diabetes mellitus with unspecified complications: Secondary | ICD-10-CM

## 2013-05-10 DIAGNOSIS — IMO0002 Reserved for concepts with insufficient information to code with codable children: Secondary | ICD-10-CM

## 2013-05-10 MED ORDER — FREESTYLE LANCETS MISC: 1.0000 | Freq: Three times a day (TID) | Status: DC

## 2013-05-10 MED ORDER — GLUCOSE BLOOD VI STRP: 1.0000 | ORAL_STRIP | Freq: Three times a day (TID) | Status: DC

## 2013-05-10 NOTE — Telephone Encounter (Signed)
PT husband called and stated that her RX of glucose blood (FREESTYLE LITE) test strip, is incorrect. The amount of times that she test is incorrect. The Lancets (FREESTYLE) RX is also incorrect. Her RX states that she test 3 times a day, but she test once a day. Please assist. Her [facility] is [facility] in [place].

## 2013-05-10 NOTE — Telephone Encounter (Signed)
rx sent in electronically 

## 2013-05-23 ENCOUNTER — Other Ambulatory Visit: Payer: Self-pay | Admitting: *Deleted

## 2013-05-23 MED ORDER — SIMVASTATIN 10 MG PO TABS: ORAL_TABLET | ORAL | Status: DC

## 2013-05-24 ENCOUNTER — Telehealth: Payer: Self-pay | Admitting: Internal Medicine

## 2013-05-24 MED ORDER — SIMVASTATIN 10 MG PO TABS: ORAL_TABLET | ORAL | Status: DC

## 2013-05-24 MED ORDER — PANTOPRAZOLE SODIUM 40 MG PO TBEC: 40.0000 mg | DELAYED_RELEASE_TABLET | Freq: Every day | ORAL | Status: DC

## 2013-05-24 NOTE — Telephone Encounter (Signed)
Pt states rx refill of simvastatin (ZOCOR) 10 MG tablet was sent to [facility] in [place] but should have gone to [facility]).  This changed occurred several years ago and needs to be updated in chart.  Pt also needs refill of pantoprazole (PROTONIX) 40 MG tablet sent to [facility] as well.  Please update pharmacy in chart!

## 2013-05-24 NOTE — Telephone Encounter (Signed)
Pharmacy updated, rx's sent in electronically

## 2013-06-20 IMAGING — CT CT CHEST W/ CM
2 of 3 series · 15 of 36 positions shown, 18 images · IV contrast (OMNIPAQUE)
Comparison: 02/16/2012

CLINICAL DATA: Cough.  Lung carcinoma. Completed chemotherapy and
radiation therapy.

CT CHEST WITH CONTRAST
TECHNIQUE: Multidetector CT imaging of the chest was performed
following the standard protocol during bolus administration of
intravenous contrast.
Contrast: 80mL OMNIPAQUE IOHEXOL 300 MG/ML  SOLN

[Series 2: chest with st · axial · 0.69mm/px · z∈[-290,-35]mm · 12 of 61 slices shown, 15 images]
[im 5/61  mediastinal]
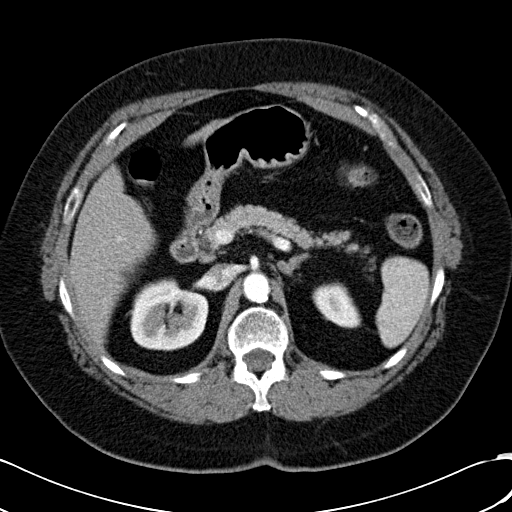
[im 5/61  lung]
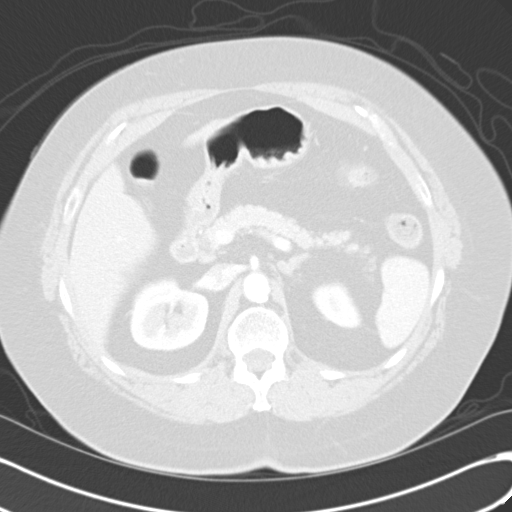
[im 9/61  lung]
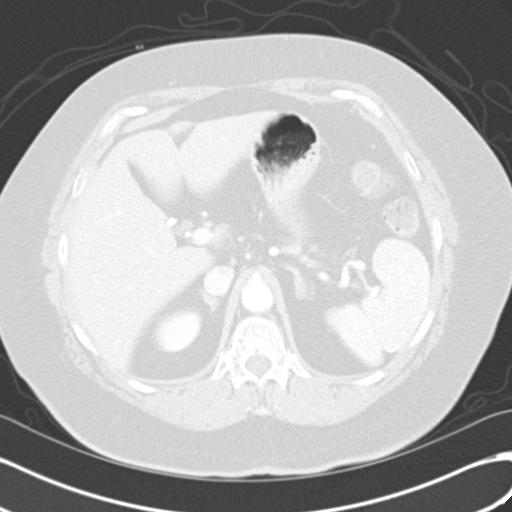
[im 14/61  lung]
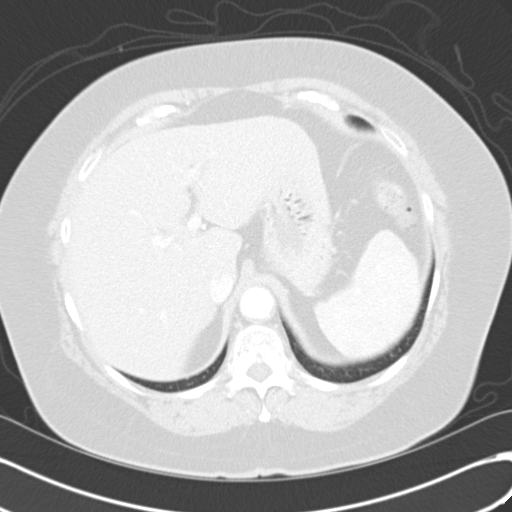
[im 18/61  lung]
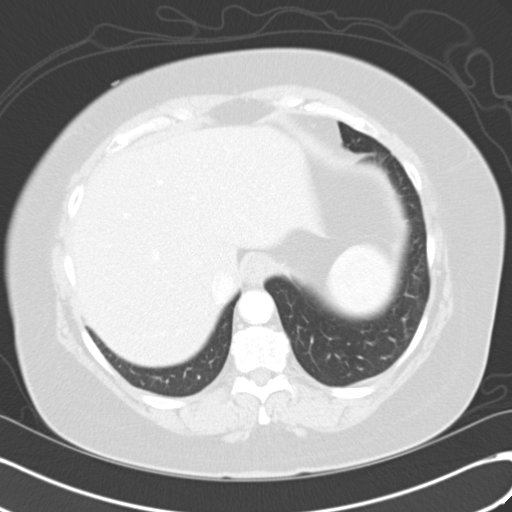
[im 23/61  mediastinal]
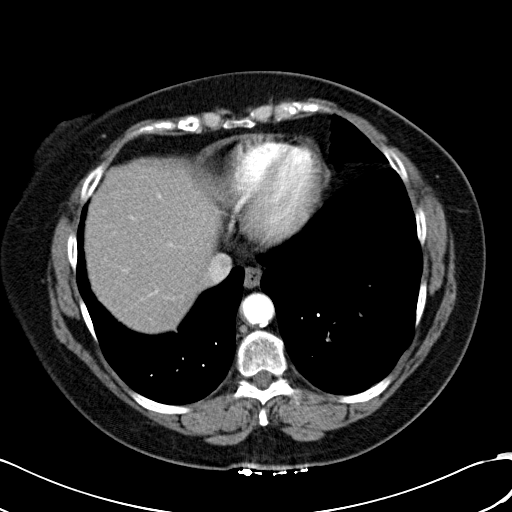
[im 23/61  lung]
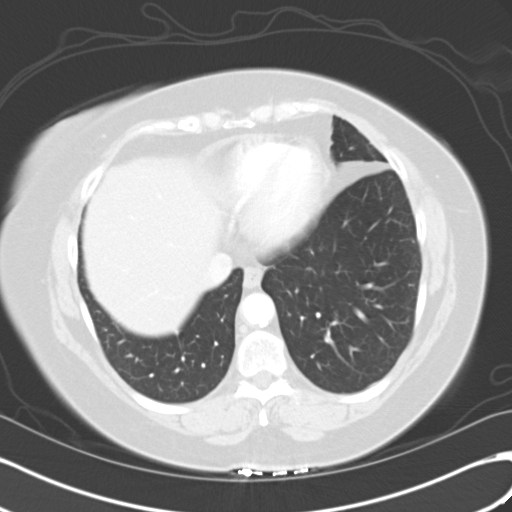
[im 27/61  lung]
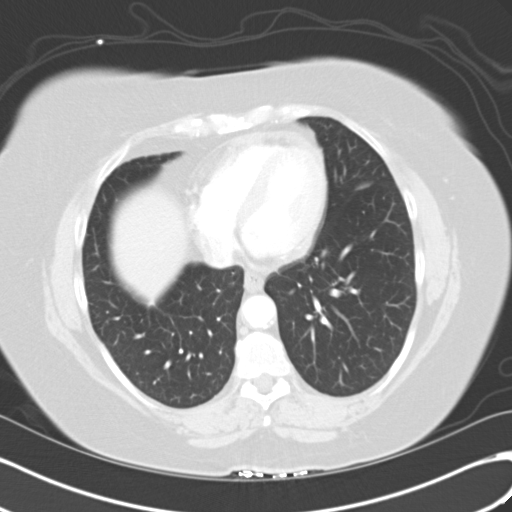
[im 34/61  lung]
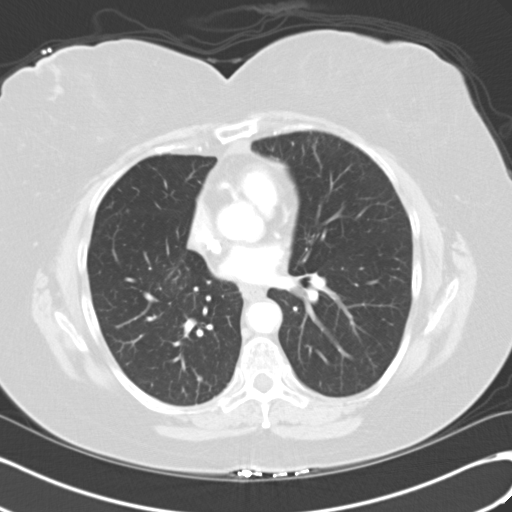
[im 38/61  lung]
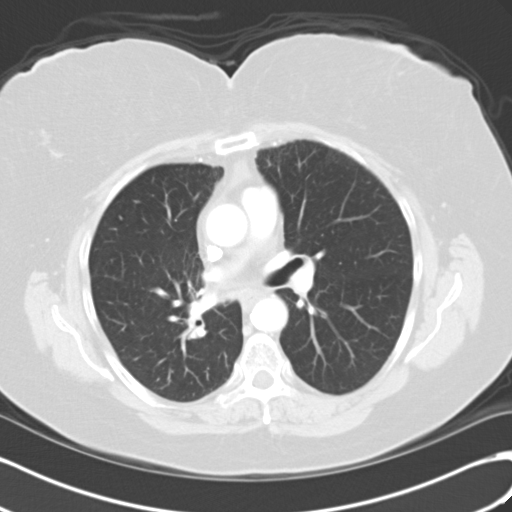
[im 43/61  mediastinal]
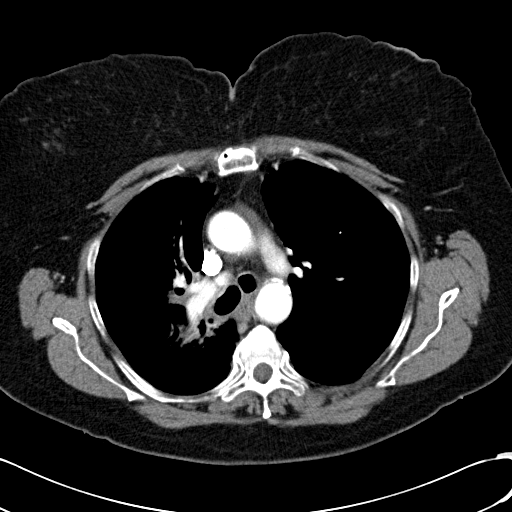
[im 43/61  lung]
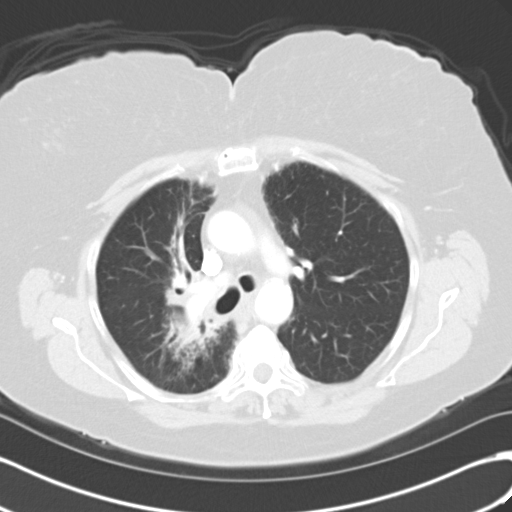
[im 47/61  lung]
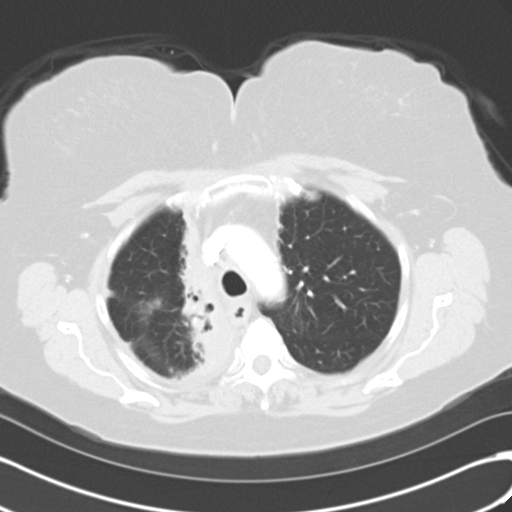
[im 52/61  lung]
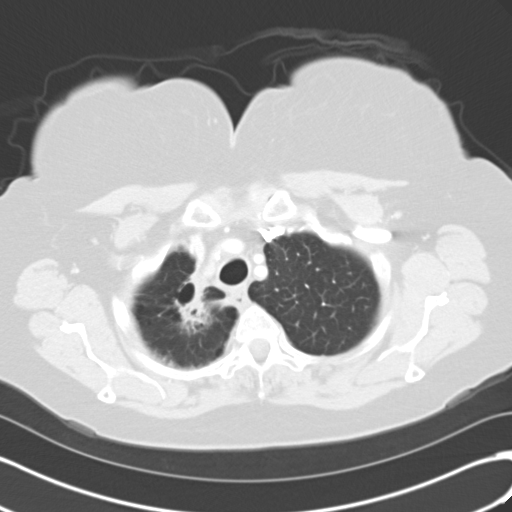
[im 56/61  lung]
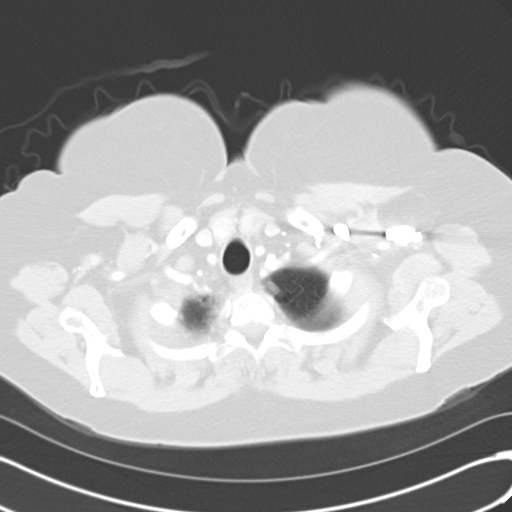

[Series 602: <mpr thick range> · coronal · 0.69mm/px · 3 of 87 slices shown]
[im 18/87  lung]
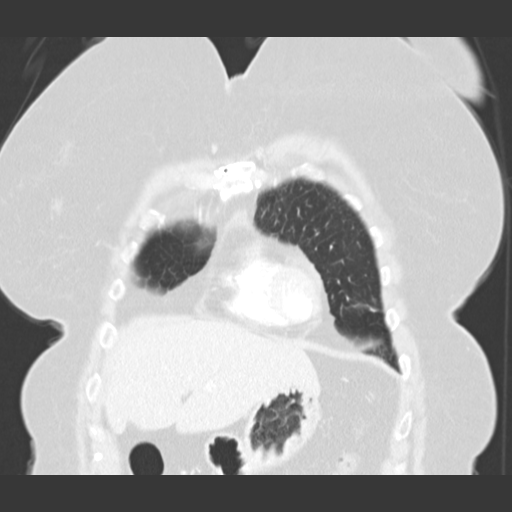
[im 35/87  lung]
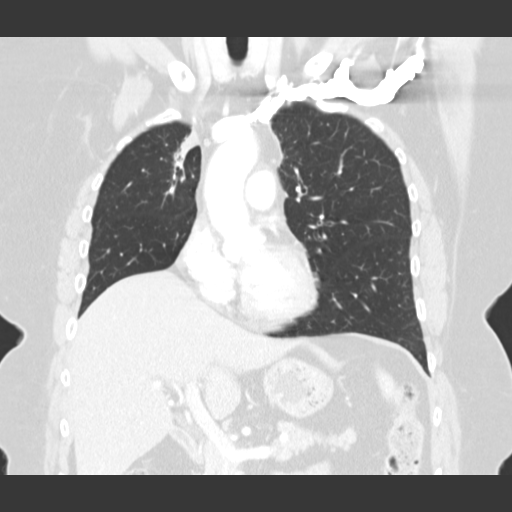
[im 52/87  lung]
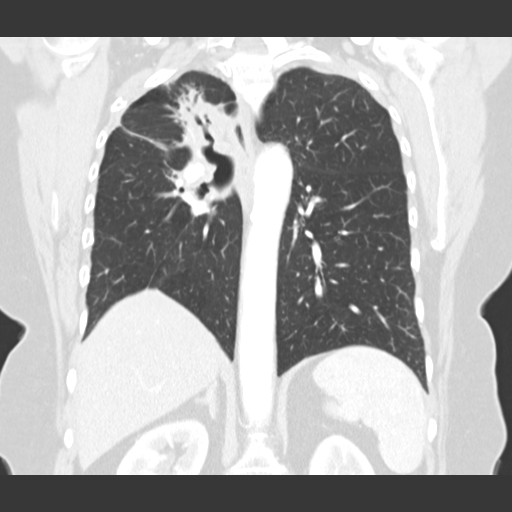

[15 of 36 positions shown; findings below may reference images not displayed]

FINDINGS: Radiation fibrosis in the right upper lung
paramediastinal region remains stable in appearance.  There is no
evidence of recurrent mass or lymphadenopathy.  No evidence of
pleural or pericardial effusion.  No lymphadenopathy seen elsewhere
within the thorax.  No suspicious bone lesions identified.  Both
adrenal glands are normal in appearance.  Tiny hypervascular focus
in the anterior liver dome remains stable and most consistent with
a benign etiology.
IMPRESSION: Stable radiation fibrosis in the right upper lung zone.  No
evidence of recurrent or metastatic carcinoma, or other active
disease.

## 2013-06-24 LAB — HM DIABETES EYE EXAM

## 2013-07-03 ENCOUNTER — Ambulatory Visit: Payer: Medicare Other | Admitting: Internal Medicine

## 2013-07-17 ENCOUNTER — Ambulatory Visit: Payer: Medicare Other | Admitting: Internal Medicine

## 2013-07-18 ENCOUNTER — Encounter: Payer: Self-pay | Admitting: Internal Medicine

## 2013-07-18 ENCOUNTER — Ambulatory Visit (INDEPENDENT_AMBULATORY_CARE_PROVIDER_SITE_OTHER): Payer: Medicare Other | Admitting: Internal Medicine

## 2013-07-18 VITALS — BP 128/70 | HR 98 | Temp 98.4°F | Ht 62.0 in | Wt 177.0 lb

## 2013-07-18 DIAGNOSIS — E1169 Type 2 diabetes mellitus with other specified complication: Secondary | ICD-10-CM

## 2013-07-18 DIAGNOSIS — IMO0002 Reserved for concepts with insufficient information to code with codable children: Secondary | ICD-10-CM

## 2013-07-18 DIAGNOSIS — C3490 Malignant neoplasm of unspecified part of unspecified bronchus or lung: Secondary | ICD-10-CM

## 2013-07-18 DIAGNOSIS — E785 Hyperlipidemia, unspecified: Secondary | ICD-10-CM

## 2013-07-18 DIAGNOSIS — Z23 Encounter for immunization: Secondary | ICD-10-CM

## 2013-07-18 DIAGNOSIS — E1165 Type 2 diabetes mellitus with hyperglycemia: Secondary | ICD-10-CM

## 2013-07-18 LAB — BASIC METABOLIC PANEL
CO2: 27 mEq/L (ref 19–32)
Chloride: 104 mEq/L (ref 96–112)
Glucose, Bld: 154 mg/dL — ABNORMAL HIGH (ref 70–99)
Potassium: 3.6 mEq/L (ref 3.5–5.1)
Sodium: 137 mEq/L (ref 135–145)

## 2013-07-18 LAB — HEPATIC FUNCTION PANEL
AST: 14 U/L (ref 0–37)
Albumin: 3.8 g/dL (ref 3.5–5.2)
Total Bilirubin: 0.9 mg/dL (ref 0.3–1.2)

## 2013-07-18 LAB — MICROALBUMIN / CREATININE URINE RATIO
Creatinine,U: 54 mg/dL
Microalb, Ur: 0.2 mg/dL (ref 0.0–1.9)

## 2013-07-18 LAB — LIPID PANEL
HDL: 34.9 mg/dL — ABNORMAL LOW (ref >39.00)
LDL Cholesterol: 113 mg/dL — ABNORMAL HIGH (ref 0–99)
Total CHOL/HDL Ratio: 5
Triglycerides: 144 mg/dL (ref 0.0–149.0)
VLDL: 28.8 mg/dL (ref 0.0–40.0)

## 2013-07-18 LAB — HEMOGLOBIN A1C: Hgb A1c MFr Bld: 7.8 % — ABNORMAL HIGH (ref 4.6–6.5)

## 2013-07-18 NOTE — Progress Notes (Signed)
patient comes in for followup of multiple medical problems including type 2 diabetes, hyperlipidemia, hypertension. The patient does not check blood sugar or blood pressure at home. The patetient does not follow an exercise or diet program. The patient denies any polyuria, polydipsia.  In the past the patient has gone to diabetic treatment center. The patient is tolerating medications  Without difficulty. The patient does admit to medication compliance.   Past Medical History  Diagnosis Date  . Diabetes mellitus   . GERD (gastroesophageal reflux disease)   . Lung cancer     non-small cell lung ca, stage III in 05/2011, s/p chemo/radiation  . COPD (chronic obstructive pulmonary disease)   . History of diverticulitis of colon   . CAD (coronary artery disease)     NON obstructive. Cath 2006 preserved LV fxn, scattered irregularities without critical stenosis. 2008 stress echo negative for ischemia, but with hypertensive response  . Hyperlipidemia   . Cancer of lung 12/24/2008    treated with radiation and chemotherapy    History   Social History  . Marital Status: Married    Spouse Name: N/A    Number of Children: N/A  . Years of Education: N/A   Occupational History  . Retired Engineer, petroleum    Social History Main Topics  . Smoking status: Former Smoker -- 1.00 packs/day for 45 years    Types: Cigarettes    Quit date: 10/24/2008  . Smokeless tobacco: Not on file  . Alcohol Use: No  . Drug Use: No  . Sexual Activity: Not on file   Other Topics Concern  . Not on file   Social History Narrative   Lives in [place], has husband and daughter and son. Daughter's family is living with her and husband at present. She is ambulatory daily without cane or walker.     Past Surgical History  Procedure Laterality Date  . Appendectomy    . Abdominal hysterectomy  1997  . Carpal tunnel release      Family History  Problem Relation Age of Onset  . Heart failure Mother   . Kidney  disease Mother     renal failure  . Diabetes Mother   . Diabetes Father   . Diabetes Brother   . Heart disease Brother   . Heart attack Brother   . Heart attack Brother   . Heart attack Brother     Allergies  Allergen Reactions  . Lactose Intolerance (Gi) Diarrhea  . Ibuprofen     REACTION: Anxiousness, hypoventilate     Current Outpatient Prescriptions on File Prior to Visit  Medication Sig Dispense Refill  . aspirin 81 MG tablet Take 81 mg by mouth daily.      . fluticasone (FLONASE) 50 MCG/ACT nasal spray Place 2 sprays into the nose daily.  16 g  6  . glucose blood (FREESTYLE LITE) test strip 1 each by Other route 3 (three) times daily. Use as instructed  100 each  11  . Insulin Isophane & Regular (HUMULIN 70/30 KWIKPEN) (70-30) 100 UNIT/ML SUPN Inject 25 Units into the skin every morning. And 22 units in the evening with meals  3 mL  5  . Lancets (FREESTYLE) lancets 1 each by Other route 3 (three) times daily. Use as instructed  100 each  11  . loratadine (CLARITIN) 10 MG tablet Take 10 mg by mouth daily.        . nitroGLYCERIN (NITROSTAT) 0.4 MG SL tablet Place 1 tablet (0.4 mg total)  under the tongue every 5 (five) minutes as needed. For chest pain  50 tablet  3  . pantoprazole (PROTONIX) 40 MG tablet Take 1 tablet (40 mg total) by mouth daily.  30 tablet  5  . simvastatin (ZOCOR) 10 MG tablet TAKE ONE TABLET BY MOUTH AT BEDTIME  30 tablet  5   No current facility-administered medications on file prior to visit.     patient denies chest pain, shortness of breath, orthopnea. Denies lower extremity edema, abdominal pain, change in appetite, change in bowel movements. Patient denies rashes, musculoskeletal complaints. No other specific complaints in a complete review of systems.   BP 128/70  Pulse 98  Temp(Src) 98.4 F (36.9 C) (Oral)  Ht 5\' 2"  (1.575 m)  Wt 177 lb (80.287 kg)  BMI 32.37 kg/m2   Well-developed well-nourished female in no acute distress. HEENT exam  atraumatic, normocephalic, extraocular muscles are intact. Neck is supple. No jugular venous distention no thyromegaly. Chest clear to auscultation without increased work of breathing. Cardiac exam S1 and S2 are regular. Abdominal exam active bowel sounds, soft, nontender. Extremities no edema. Neurologic exam she is alert without any motor sensory deficits. Gait is normal.

## 2013-07-20 NOTE — Assessment & Plan Note (Signed)
Check labs Continue meds 

## 2013-07-20 NOTE — Assessment & Plan Note (Signed)
Has regular f/u with oncology 

## 2013-07-20 NOTE — Assessment & Plan Note (Signed)
Check labs today contnue meds for now

## 2013-08-05 ENCOUNTER — Telehealth: Payer: Self-pay | Admitting: Internal Medicine

## 2013-08-05 NOTE — Telephone Encounter (Signed)
no vm available....mailed pt avs and letter °

## 2013-08-19 ENCOUNTER — Ambulatory Visit (HOSPITAL_COMMUNITY)
Admission: RE | Admit: 2013-08-19 | Discharge: 2013-08-19 | Disposition: A | Discharge status: Home / Self Care | Payer: Medicare Other | Source: Ambulatory Visit | Attending: Internal Medicine | Admitting: Internal Medicine

## 2013-08-19 ENCOUNTER — Other Ambulatory Visit (HOSPITAL_BASED_OUTPATIENT_CLINIC_OR_DEPARTMENT_OTHER): Payer: Medicare Other | Admitting: Lab

## 2013-08-19 ENCOUNTER — Encounter (INDEPENDENT_AMBULATORY_CARE_PROVIDER_SITE_OTHER): Payer: Self-pay

## 2013-08-19 DIAGNOSIS — I7090 Unspecified atherosclerosis: Secondary | ICD-10-CM | POA: Insufficient documentation

## 2013-08-19 DIAGNOSIS — Y842 Radiological procedure and radiotherapy as the cause of abnormal reaction of the patient, or of later complication, without mention of misadventure at the time of the procedure: Secondary | ICD-10-CM | POA: Insufficient documentation

## 2013-08-19 DIAGNOSIS — C3410 Malignant neoplasm of upper lobe, unspecified bronchus or lung: Secondary | ICD-10-CM

## 2013-08-19 DIAGNOSIS — K7689 Other specified diseases of liver: Secondary | ICD-10-CM | POA: Insufficient documentation

## 2013-08-19 DIAGNOSIS — J701 Chronic and other pulmonary manifestations due to radiation: Secondary | ICD-10-CM | POA: Insufficient documentation

## 2013-08-19 DIAGNOSIS — Z9221 Personal history of antineoplastic chemotherapy: Secondary | ICD-10-CM | POA: Insufficient documentation

## 2013-08-19 DIAGNOSIS — C3490 Malignant neoplasm of unspecified part of unspecified bronchus or lung: Secondary | ICD-10-CM | POA: Insufficient documentation

## 2013-08-19 LAB — COMPREHENSIVE METABOLIC PANEL (CC13)
ALT: 15 U/L (ref 0–55)
AST: 17 U/L (ref 5–34)
Chloride: 106 mEq/L (ref 98–109)
Creatinine: 0.8 mg/dL (ref 0.6–1.1)
Sodium: 141 mEq/L (ref 136–145)
Total Bilirubin: 0.5 mg/dL (ref 0.20–1.20)
Total Protein: 7.2 g/dL (ref 6.4–8.3)

## 2013-08-19 LAB — CBC WITH DIFFERENTIAL/PLATELET
BASO%: 0.5 % (ref 0.0–2.0)
EOS%: 2.9 % (ref 0.0–7.0)
HCT: 41.7 % (ref 34.8–46.6)
LYMPH%: 24.8 % (ref 14.0–49.7)
MCH: 30.3 pg (ref 25.1–34.0)
MCHC: 33.8 g/dL (ref 31.5–36.0)
NEUT%: 62.2 % (ref 38.4–76.8)
RBC: 4.65 10*6/uL (ref 3.70–5.45)
lymph#: 1.3 10*3/uL (ref 0.9–3.3)

## 2013-08-19 MED ORDER — IOHEXOL 300 MG/ML  SOLN
80.0000 mL | Freq: Once | INTRAMUSCULAR | Status: AC | PRN
  Administered 2013-08-19: 80 mL via INTRAVENOUS

## 2013-08-21 ENCOUNTER — Ambulatory Visit: Payer: Medicare Other | Admitting: Internal Medicine

## 2013-08-26 ENCOUNTER — Other Ambulatory Visit: Payer: Self-pay | Admitting: Internal Medicine

## 2013-08-27 ENCOUNTER — Telehealth: Payer: Self-pay | Admitting: Internal Medicine

## 2013-08-27 ENCOUNTER — Encounter: Payer: Self-pay | Admitting: Physician Assistant

## 2013-08-27 ENCOUNTER — Ambulatory Visit (HOSPITAL_BASED_OUTPATIENT_CLINIC_OR_DEPARTMENT_OTHER): Payer: Medicare Other | Admitting: Physician Assistant

## 2013-08-27 ENCOUNTER — Encounter (INDEPENDENT_AMBULATORY_CARE_PROVIDER_SITE_OTHER): Payer: Self-pay

## 2013-08-27 VITALS — BP 116/70 | HR 85 | Temp 96.9°F | Resp 17 | Ht 62.0 in | Wt 177.2 lb

## 2013-08-27 DIAGNOSIS — Z85118 Personal history of other malignant neoplasm of bronchus and lung: Secondary | ICD-10-CM

## 2013-08-27 DIAGNOSIS — C3490 Malignant neoplasm of unspecified part of unspecified bronchus or lung: Secondary | ICD-10-CM

## 2013-08-27 NOTE — Progress Notes (Addendum)
[facility] [phone]   Fax:([phone]) [phone]  SHARED VISIT PROGRESS NOTE  [name], MD [address] [place] [address]  PRINCIPAL DIAGNOSIS: Limited stage small cell lung cancer diagnosed in March of 2010.   PRIOR THERAPY:  1. Status post 4 cycles of systemic chemotherapy with carboplatin and etoposide concurrent with radiation during cycle 2 and 3. The last dose of chemotherapy was given April 01, 2009. 2. Status post prophylactic cranial irradiation under the care of Dr. Mitzi Hansen completed May 19, 2009.  CURRENT THERAPY: Observation.  INTERVAL HISTORY: [name] 67 y.o. female returns to the clinic today for routine six-month followup visit. She is accompanied by her daughter today. The patient is feeling fine today with no specific complaints except for mild cough. The cough is nonproductive and not associated with any fever or chills. Her daughter reports that the family has noticed the patient's short-term memory is diminished. There thinking that this may be related to her whole brain radiation. Her long-term memory is intact. She otherwise has no other issues.  The patient denied having any significant chest pain, shortness breath, or hemoptysis. She had repeat CT scan of the chest performed recently and she is here for evaluation and discussion of her scan results.  MEDICAL HISTORY: Past Medical History  Diagnosis Date  . Diabetes mellitus   . GERD (gastroesophageal reflux disease)   . Lung cancer     non-small cell lung ca, stage III in 05/2011, s/p chemo/radiation  . COPD (chronic obstructive pulmonary disease)   . History of diverticulitis of colon   . CAD (coronary artery disease)     NON obstructive. Cath 2006 preserved LV fxn, scattered irregularities without critical stenosis. 2008 stress echo negative for ischemia, but with hypertensive response  . Hyperlipidemia   . Cancer of lung 12/24/2008    treated with  radiation and chemotherapy    ALLERGIES:  is allergic to lactose intolerance (gi) and ibuprofen.  MEDICATIONS:  Current Outpatient Prescriptions  Medication Sig Dispense Refill  . aspirin 81 MG tablet Take 81 mg by mouth daily.      . fluticasone (FLONASE) 50 MCG/ACT nasal spray Place 2 sprays into the nose daily.  16 g  6  . glucose blood (FREESTYLE LITE) test strip 1 each by Other route 3 (three) times daily. Use as instructed  100 each  11  . HUMULIN 70/30 KWIKPEN (70-30) 100 UNIT/ML SUPN INJECT 25 UNITS SUBCUTANEOUSLY IN THE MORNING AND 22 UNITS IN THE EVENING WITH MEALS  15 pen  2  . Lancets (FREESTYLE) lancets 1 each by Other route 3 (three) times daily. Use as instructed  100 each  11  . loratadine (CLARITIN) 10 MG tablet Take 10 mg by mouth daily.        . pantoprazole (PROTONIX) 40 MG tablet Take 1 tablet (40 mg total) by mouth daily.  30 tablet  5  . simvastatin (ZOCOR) 10 MG tablet TAKE ONE TABLET BY MOUTH AT BEDTIME  30 tablet  5  . nitroGLYCERIN (NITROSTAT) 0.4 MG SL tablet Place 1 tablet (0.4 mg total) under the tongue every 5 (five) minutes as needed. For chest pain  50 tablet  3   No current facility-administered medications for this visit.    SURGICAL HISTORY:  Past Surgical History  Procedure Laterality Date  . Appendectomy    . Abdominal hysterectomy  1997  . Carpal tunnel release      REVIEW OF  SYSTEMS:  A comprehensive review of systems was negative.   PHYSICAL EXAMINATION: General appearance: alert, cooperative and no distress Head: Normocephalic, without obvious abnormality, atraumatic Neck: no adenopathy Resp: clear to auscultation bilaterally Cardio: regular rate and rhythm, S1, S2 normal, no murmur, click, rub or gallop GI: soft, non-tender; bowel sounds normal; no masses,  no organomegaly Extremities: extremities normal, atraumatic, no cyanosis or edema  ECOG PERFORMANCE STATUS: 0 - Asymptomatic  Blood pressure 116/70, pulse 85, temperature 96.9 F  (36.1 C), temperature source Oral, resp. rate 17, height 5\' 2"  (1.575 m), weight 177 lb 3.2 oz (80.377 kg), SpO2 96.00%.  LABORATORY DATA: Lab Results  Component Value Date   WBC 5.2 08/19/2013   HGB 14.1 08/19/2013   HCT 41.7 08/19/2013   MCV 89.6 08/19/2013   PLT 176 08/19/2013      Chemistry      Component Value Date/Time   NA 141 08/19/2013 0758   NA 137 07/18/2013 0842   NA 141 02/16/2012 0803   K 4.0 08/19/2013 0758   K 3.6 07/18/2013 0842   K 3.9 02/16/2012 0803   CL 104 07/18/2013 0842   CL 105 02/18/2013 0812   CL 99 02/16/2012 0803   CO2 26 08/19/2013 0758   CO2 27 07/18/2013 0842   CO2 28 02/16/2012 0803   BUN 10.5 08/19/2013 0758   BUN 10 07/18/2013 0842   BUN 12 02/16/2012 0803   CREATININE 0.8 08/19/2013 0758   CREATININE 0.7 07/18/2013 0842   CREATININE 0.9 02/16/2012 0803      Component Value Date/Time   CALCIUM 9.5 08/19/2013 0758   CALCIUM 9.0 07/18/2013 0842   CALCIUM 8.7 02/16/2012 0803   ALKPHOS 103 08/19/2013 0758   ALKPHOS 95 07/18/2013 0842   ALKPHOS 75 02/16/2012 0803   AST 17 08/19/2013 0758   AST 14 07/18/2013 0842   AST 20 02/16/2012 0803   ALT 15 08/19/2013 0758   ALT 12 07/18/2013 0842   ALT 22 02/16/2012 0803   BILITOT 0.50 08/19/2013 0758   BILITOT 0.9 07/18/2013 0842   BILITOT 0.70 02/16/2012 0803       RADIOGRAPHIC STUDIES:  Ct Chest W Contrast  08/19/2013   CLINICAL DATA:  Lung cancer followup. Chemotherapy complete.  EXAM: CT CHEST WITH CONTRAST  TECHNIQUE: Multidetector CT imaging of the chest was performed during intravenous contrast administration.  CONTRAST:  80mL OMNIPAQUE IOHEXOL 300 MG/ML  SOLN  COMPARISON:  02/18/2013 and 08/17/2012.  FINDINGS: Sub cm low-attenuation lesions in the thyroid are unchanged. No discrete pathologically enlarged mediastinal, left hilar or axillary lymph nodes. Soft tissue thickening along the lower right paratracheal region and right mainstem bronchus is unchanged. Atherosclerotic calcification of the arterial  vasculature. Heart is at the upper limits of normal in size. No pericardial effusion. Mild esophageal wall thickening may be related to radiation therapy.  Radiation fibrosis and volume loss are seen in the upper right hemi thorax, medially, unchanged. Probable subpleural atelectasis dependently in the right lower lobe (image 30). 3 mm posterior right lower lobe nodule (image 29), unchanged from 08/17/2012. Minimal subpleural scarring in the left lung. Airway is otherwise unremarkable.  Incidental imaging of the upper abdomen shows a 9 mm hyper attenuating lesion in the dome of the liver, similar to the prior exam. Liver appears slightly decreased in attenuation diffusely and the margin is slightly irregular. Sub cm low-attenuation lesions in the left kidney are unchanged and too small to characterize. Adrenal glands are unremarkable. No worrisome lytic or sclerotic  lesions. Degenerative changes are seen in the spine.  IMPRESSION: 1. 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 2. Fatty liver. Question changes of mild cirrhosis.   Electronically Signed   By: Leanna Battles M.D.   On: 08/19/2013 09:30   ASSESSMENT: This is a very pleasant 67 years old white female with history of limited stage small cell lung cancer status post systemic chemotherapy concurrent with radiation followed by prophylactic cranial irradiation and has been observation since July of 2010 with no evidence for disease recurrence.   PLAN: The patient was discussed with an also seen by Dr. Arbutus Ped who also reviewed the CT scan results with the patient and her daughter. She remain on observation and return in 6 months with repeat CT scan of her chest with contrast as well as an MRI of the brain with and without contrast. When she returns in 6 months she will be at the five-year mark from diagnosis.  Minna Dumire E, PA-C   She was advised to call immediately if she  has any concerning symptoms in the interval.  All questions were answered. The patient knows to call the clinic with any problems, questions or concerns. We can certainly see the patient much sooner if necessary.   ADDENDUM: Hematology/Oncology Attending: I had a face to face encounter with the patient. I recommended her care plan. This is a very pleasant 67 years old white female with history of limited stage small cell lung cancer status post 4 cycles of systemic chemotherapy with carboplatin and etoposide concurrent with radiation followed by prophylactic irradiation completed in July of 2010. The patient has been doing fine with no evidence for disease recurrence since that time. Her recent CT scan of the chest showed no evidence for disease recurrence. I recommended for the patient to continue on observation with repeat CT scan of the chest in 6 months. She was advised to call immediately if she has any concerning symptoms in the interval. [name]., MD 08/27/2013

## 2013-08-27 NOTE — Patient Instructions (Signed)
Your CT scan revealed no evidence for disease recurrence or metastatic disease. You will continue on observation  Followup in 6 months with a restaging CT scan of your chest and an MRI of your brain to reevaluate her disease

## 2013-08-27 NOTE — Telephone Encounter (Signed)
gv adn printed appt sched and avs for pt for March 2015  °

## 2013-10-02 ENCOUNTER — Encounter (HOSPITAL_COMMUNITY): Payer: Self-pay | Admitting: Emergency Medicine

## 2013-10-02 ENCOUNTER — Emergency Department (HOSPITAL_COMMUNITY)
Admission: EM | Admit: 2013-10-02 | Discharge: 2013-10-02 | Disposition: A | Discharge status: Home / Self Care | Payer: Medicare Other | Attending: Emergency Medicine | Admitting: Emergency Medicine

## 2013-10-02 ENCOUNTER — Emergency Department (HOSPITAL_COMMUNITY): Payer: Medicare Other

## 2013-10-02 DIAGNOSIS — W1809XA Striking against other object with subsequent fall, initial encounter: Secondary | ICD-10-CM | POA: Insufficient documentation

## 2013-10-02 DIAGNOSIS — K219 Gastro-esophageal reflux disease without esophagitis: Secondary | ICD-10-CM | POA: Insufficient documentation

## 2013-10-02 DIAGNOSIS — T148XXA Other injury of unspecified body region, initial encounter: Secondary | ICD-10-CM

## 2013-10-02 DIAGNOSIS — Z7982 Long term (current) use of aspirin: Secondary | ICD-10-CM | POA: Insufficient documentation

## 2013-10-02 DIAGNOSIS — IMO0002 Reserved for concepts with insufficient information to code with codable children: Secondary | ICD-10-CM | POA: Insufficient documentation

## 2013-10-02 DIAGNOSIS — Z79899 Other long term (current) drug therapy: Secondary | ICD-10-CM | POA: Insufficient documentation

## 2013-10-02 DIAGNOSIS — Z98811 Dental restoration status: Secondary | ICD-10-CM | POA: Insufficient documentation

## 2013-10-02 DIAGNOSIS — E041 Nontoxic single thyroid nodule: Secondary | ICD-10-CM

## 2013-10-02 DIAGNOSIS — W010XXA Fall on same level from slipping, tripping and stumbling without subsequent striking against object, initial encounter: Secondary | ICD-10-CM | POA: Insufficient documentation

## 2013-10-02 DIAGNOSIS — R05 Cough: Secondary | ICD-10-CM | POA: Insufficient documentation

## 2013-10-02 DIAGNOSIS — J449 Chronic obstructive pulmonary disease, unspecified: Secondary | ICD-10-CM | POA: Insufficient documentation

## 2013-10-02 DIAGNOSIS — Z923 Personal history of irradiation: Secondary | ICD-10-CM | POA: Insufficient documentation

## 2013-10-02 DIAGNOSIS — J4489 Other specified chronic obstructive pulmonary disease: Secondary | ICD-10-CM | POA: Insufficient documentation

## 2013-10-02 DIAGNOSIS — Y9389 Activity, other specified: Secondary | ICD-10-CM | POA: Insufficient documentation

## 2013-10-02 DIAGNOSIS — W19XXXA Unspecified fall, initial encounter: Secondary | ICD-10-CM

## 2013-10-02 DIAGNOSIS — R042 Hemoptysis: Secondary | ICD-10-CM | POA: Insufficient documentation

## 2013-10-02 DIAGNOSIS — Z85118 Personal history of other malignant neoplasm of bronchus and lung: Secondary | ICD-10-CM | POA: Insufficient documentation

## 2013-10-02 DIAGNOSIS — R059 Cough, unspecified: Secondary | ICD-10-CM | POA: Insufficient documentation

## 2013-10-02 DIAGNOSIS — E119 Type 2 diabetes mellitus without complications: Secondary | ICD-10-CM | POA: Insufficient documentation

## 2013-10-02 DIAGNOSIS — Y9289 Other specified places as the place of occurrence of the external cause: Secondary | ICD-10-CM | POA: Insufficient documentation

## 2013-10-02 DIAGNOSIS — Z794 Long term (current) use of insulin: Secondary | ICD-10-CM | POA: Insufficient documentation

## 2013-10-02 DIAGNOSIS — Z87891 Personal history of nicotine dependence: Secondary | ICD-10-CM | POA: Insufficient documentation

## 2013-10-02 DIAGNOSIS — I2510 Atherosclerotic heart disease of native coronary artery without angina pectoris: Secondary | ICD-10-CM | POA: Insufficient documentation

## 2013-10-02 DIAGNOSIS — R000 Tachycardia, unspecified: Secondary | ICD-10-CM | POA: Insufficient documentation

## 2013-10-02 DIAGNOSIS — E785 Hyperlipidemia, unspecified: Secondary | ICD-10-CM | POA: Insufficient documentation

## 2013-10-02 LAB — COMPREHENSIVE METABOLIC PANEL
ALT: 17 U/L (ref 0–35)
AST: 18 U/L (ref 0–37)
Albumin: 3.7 g/dL (ref 3.5–5.2)
Calcium: 9.2 mg/dL (ref 8.4–10.5)
Chloride: 104 mEq/L (ref 96–112)
Creatinine, Ser: 0.83 mg/dL (ref 0.50–1.10)
GFR calc Af Amer: 84 mL/min — ABNORMAL LOW (ref >90)
Glucose, Bld: 157 mg/dL — ABNORMAL HIGH (ref 70–99)
Potassium: 3.9 mEq/L (ref 3.5–5.1)
Sodium: 137 mEq/L (ref 135–145)
Total Bilirubin: 0.2 mg/dL — ABNORMAL LOW (ref 0.3–1.2)

## 2013-10-02 LAB — CBC WITH DIFFERENTIAL/PLATELET
Basophils Absolute: 0 10*3/uL (ref 0.0–0.1)
Basophils Relative: 0 % (ref 0–1)
HCT: 38.3 % (ref 36.0–46.0)
Lymphocytes Relative: 25 % (ref 12–46)
MCHC: 32.9 g/dL (ref 30.0–36.0)
Monocytes Absolute: 0.5 10*3/uL (ref 0.1–1.0)
Monocytes Relative: 9 % (ref 3–12)
Neutro Abs: 3.6 10*3/uL (ref 1.7–7.7)
Platelets: 177 10*3/uL (ref 150–400)
RDW: 13.9 % (ref 11.5–15.5)
WBC: 5.7 10*3/uL (ref 4.0–10.5)

## 2013-10-02 MED ORDER — ACETAMINOPHEN 325 MG PO TABS
650.0000 mg | ORAL_TABLET | Freq: Once | ORAL | Status: AC
  Administered 2013-10-02: 650 mg via ORAL
  Filled 2013-10-02: qty 2

## 2013-10-02 MED ORDER — IOHEXOL 350 MG/ML SOLN
100.0000 mL | Freq: Once | INTRAVENOUS | Status: AC | PRN
  Administered 2013-10-02: 100 mL via INTRAVENOUS

## 2013-10-02 NOTE — ED Provider Notes (Signed)
CSN: [id number]     Arrival date & time 10/02/13  1728 History   First MD Initiated Contact with Patient 10/02/13 1748     Chief Complaint  Patient presents with  . Fall  . Extremity Laceration  . Back Pain   (Consider location/radiation/quality/duration/timing/severity/associated sxs/prior Treatment) The history is provided by the patient.    This is a 67 year old female brought in by her daughter after the fall.  The patient has a history of diabetes, non-small cell lung cancer which is in remission, COPD, coronary artery disease.  The patient had a mechanical fall this evening witnessed by her husband.  Apparently tripped over some children's toys.  She suffered a small laceration to the right forearm and then fell backward onto her back.  She is complaining of mid thoracic and right lower back pain in the region of the sacrum.  The patient has short term memory deficit after her radiation to her brain during her cancer treatment.  She is not remember the event.  Her daughter states that is normal for her.  The daughter is also concerned that after the fall the patient coughed up several large blood clots.  She states that this has never happened before.  She states that she is very concerned considering her mother's history of cancer.  The patient has not had fevers, weight loss, soaking night sweats, fatigue, appetite change. She's been otherwise normal. Past Medical History  Diagnosis Date  . Diabetes mellitus   . GERD (gastroesophageal reflux disease)   . Lung cancer     non-small cell lung ca, stage III in 05/2011, s/p chemo/radiation  . COPD (chronic obstructive pulmonary disease)   . History of diverticulitis of colon   . CAD (coronary artery disease)     NON obstructive. Cath 2006 preserved LV fxn, scattered irregularities without critical stenosis. 2008 stress echo negative for ischemia, but with hypertensive response  . Hyperlipidemia   . Cancer of lung 12/24/2008    treated  with radiation and chemotherapy   Past Surgical History  Procedure Laterality Date  . Appendectomy    . Abdominal hysterectomy  1997  . Carpal tunnel release     Family History  Problem Relation Age of Onset  . Heart failure Mother   . Kidney disease Mother     renal failure  . Diabetes Mother   . Diabetes Father   . Diabetes Brother   . Heart disease Brother   . Heart attack Brother   . Heart attack Brother   . Heart attack Brother    History  Substance Use Topics  . Smoking status: Former Smoker -- 1.00 packs/day for 45 years    Types: Cigarettes    Quit date: 10/24/2008  . Smokeless tobacco: Not on file  . Alcohol Use: No   OB History   Grav Para Term Preterm Abortions TAB SAB Ect Mult Living                 Review of Systems  Constitutional: Negative for fever, chills, diaphoresis, activity change, appetite change, fatigue and unexpected weight change.  Eyes: Negative for visual disturbance.  Respiratory: Positive for cough. Negative for shortness of breath.   Cardiovascular: Negative for chest pain.  Gastrointestinal: Negative for nausea, vomiting and abdominal pain.  Genitourinary: Negative for dysuria.  Musculoskeletal: Negative for neck pain.  Skin: Positive for wound.  Neurological: Negative for syncope and weakness.  Hematological: Negative for adenopathy.      Allergies  Lactose  intolerance (gi) and Ibuprofen  Home Medications   Current Outpatient Rx  Name  Route  Sig  Dispense  Refill  . aspirin 81 MG tablet   Oral   Take 81 mg by mouth daily.         . fluticasone (FLONASE) 50 MCG/ACT nasal spray   Nasal   Place 2 sprays into the nose daily.   16 g   6   . glucose blood (FREESTYLE LITE) test strip   Other   1 each by Other route 3 (three) times daily. Use as instructed   100 each   11   . HUMULIN 70/30 KWIKPEN (70-30) 100 UNIT/ML SUPN      INJECT 25 UNITS SUBCUTANEOUSLY IN THE MORNING AND 22 UNITS IN THE EVENING WITH MEALS   15  pen   2   . Lancets (FREESTYLE) lancets   Other   1 each by Other route 3 (three) times daily. Use as instructed   100 each   11   . loratadine (CLARITIN) 10 MG tablet   Oral   Take 10 mg by mouth daily.           . nitroGLYCERIN (NITROSTAT) 0.4 MG SL tablet   Sublingual   Place 1 tablet (0.4 mg total) under the tongue every 5 (five) minutes as needed. For chest pain   50 tablet   3   . pantoprazole (PROTONIX) 40 MG tablet   Oral   Take 1 tablet (40 mg total) by mouth daily.   30 tablet   5   . simvastatin (ZOCOR) 10 MG tablet      TAKE ONE TABLET BY MOUTH AT BEDTIME   30 tablet   5    BP 112/62  Pulse 93  Temp(Src) 98.1 F (36.7 C) (Oral)  Resp 20  SpO2 93% Physical Exam  Nursing note and vitals reviewed. Constitutional: She is oriented to person, place, and time. She appears well-developed and well-nourished.  HENT:  Head: Normocephalic and atraumatic.  Wears dentures. No cuts in mouth or throat.  Eyes: Pupils are equal, round, and reactive to light.  Neck: Normal range of motion.  Cardiovascular: Normal rate.   Pulmonary/Chest: Effort normal. She has no wheezes.  Abdominal: Soft.  Neurological: She is alert and oriented to person, place, and time. No cranial nerve deficit. Coordination normal.  Skin: Skin is warm and dry.  4cm abrasion R forearm    ED Course  Procedures (including critical care time) Labs Review Labs Reviewed  CBC WITH DIFFERENTIAL  COMPREHENSIVE METABOLIC PANEL   Imaging Review No results found.  EKG Interpretation   None       MDM   1. Fall, initial encounter   2. Hemoptysis   3. Abrasion   4. Thyroid nodule    Patient with hx NSCLC, fall, hemoptysis. Borderline tachycardic, 02 sats 93%.  I was called in by nurse for active hemoptysis. Patient will get CT ANGIO.CHEST   9:04 PM Filed Vitals:   10/02/13 1744  BP: 112/62  Pulse: 93  Temp: 98.1 F (36.7 C)  TempSrc: Oral  Resp: 20  SpO2: 93%    Patient  with dyspnea, no cp. No anticoagulants except a baby aspirin daily. F/u with pcp in the next 24-48 hoursCTA negative for acute abnormality. Discussed lab findings as well as incidental thyroid nodule seen on CT.  Patient is advised to followup with her primary care physician regarding these findings.  Specific return precautions discussed.  She  should follow up with her primary care doctor regarding her aspirin use.    Arthor Captain, PA-C 10/05/13 1432

## 2013-10-02 NOTE — ED Notes (Signed)
Pt c/o R arm pain, R arm laceration, and mid back pain after a fall.  Pain score 3/10.  Pt sts "I tripped over some kids toys and fell."  Pt's daughter sts "her back hit our fireplace hearth and I think, she may have coughed up some blood.."  Denies LOC.

## 2013-10-04 ENCOUNTER — Encounter: Payer: Self-pay | Admitting: Internal Medicine

## 2013-10-04 ENCOUNTER — Ambulatory Visit (INDEPENDENT_AMBULATORY_CARE_PROVIDER_SITE_OTHER): Payer: Medicare Other | Admitting: Internal Medicine

## 2013-10-04 VITALS — BP 122/74 | Temp 98.0°F | Ht 62.0 in | Wt 176.0 lb

## 2013-10-04 DIAGNOSIS — E041 Nontoxic single thyroid nodule: Secondary | ICD-10-CM

## 2013-10-04 DIAGNOSIS — W1809XA Striking against other object with subsequent fall, initial encounter: Secondary | ICD-10-CM

## 2013-10-04 DIAGNOSIS — W1809XD Striking against other object with subsequent fall, subsequent encounter: Secondary | ICD-10-CM

## 2013-10-04 DIAGNOSIS — R042 Hemoptysis: Secondary | ICD-10-CM

## 2013-10-04 NOTE — Progress Notes (Signed)
Recent fall- seen in ED Hemoptysis after fall- no recurrence Incidental thyroid nodules noted on CTA of chest Lung CA- reviewed recent CT scans  Past Medical History  Diagnosis Date  . Diabetes mellitus   . GERD (gastroesophageal reflux disease)   . Lung cancer     non-small cell lung ca, stage III in 05/2011, s/p chemo/radiation  . COPD (chronic obstructive pulmonary disease)   . History of diverticulitis of colon   . CAD (coronary artery disease)     NON obstructive. Cath 2006 preserved LV fxn, scattered irregularities without critical stenosis. 2008 stress echo negative for ischemia, but with hypertensive response  . Hyperlipidemia   . Cancer of lung 12/24/2008    treated with radiation and chemotherapy    History   Social History  . Marital Status: Married    Spouse Name: N/A    Number of Children: N/A  . Years of Education: N/A   Occupational History  . Retired Engineer, petroleum    Social History Main Topics  . Smoking status: Former Smoker -- 1.00 packs/day for 45 years    Types: Cigarettes    Quit date: 10/24/2008  . Smokeless tobacco: Not on file  . Alcohol Use: No  . Drug Use: No  . Sexual Activity: Not on file   Other Topics Concern  . Not on file   Social History Narrative   Lives in [place], has husband and daughter and son. Daughter's family is living with her and husband at present. She is ambulatory daily without cane or walker.     Past Surgical History  Procedure Laterality Date  . Appendectomy    . Abdominal hysterectomy  1997  . Carpal tunnel release      Family History  Problem Relation Age of Onset  . Heart failure Mother   . Kidney disease Mother     renal failure  . Diabetes Mother   . Diabetes Father   . Diabetes Brother   . Heart disease Brother   . Heart attack Brother   . Heart attack Brother   . Heart attack Brother     Allergies  Allergen Reactions  . Lactose Intolerance (Gi) Diarrhea  . Ibuprofen     REACTION:  Anxiousness, hypoventilate     Current Outpatient Prescriptions on File Prior to Visit  Medication Sig Dispense Refill  . aspirin 81 MG tablet Take 81 mg by mouth daily.      . fluticasone (FLONASE) 50 MCG/ACT nasal spray Place 2 sprays into the nose daily.  16 g  6  . Insulin Isophane & Regular (HUMULIN 70/30 KWIKPEN [place]) Inject 22 Units into the skin 2 (two) times daily.      Marland Kitchen loratadine (CLARITIN) 10 MG tablet Take 10 mg by mouth daily.        . nitroGLYCERIN (NITROSTAT) 0.4 MG SL tablet Place 1 tablet (0.4 mg total) under the tongue every 5 (five) minutes as needed. For chest pain  50 tablet  3  . pantoprazole (PROTONIX) 40 MG tablet Take 1 tablet (40 mg total) by mouth daily.  30 tablet  5  . simvastatin (ZOCOR) 10 MG tablet Take 10 mg by mouth daily at 6 PM.       No current facility-administered medications on file prior to visit.     patient denies chest pain, shortness of breath, orthopnea. Denies lower extremity edema, abdominal pain, change in appetite, change in bowel movements. Patient denies rashes, musculoskeletal complaints. No other specific complaints in  a complete review of systems.   Reviewed vitals: elderly female in no acute distress. HEENT exam atraumatic, normocephalic, extraocular muscles are intact. Neck is supple. No jugular venous distention no thyromegaly. Chest clear to auscultation without increased work of breathing. Cardiac exam S1 and S2 are regular. Abdominal exam active bowel sounds, soft, nontender. Extremities no edema. Neurologic exam she is alert without any motor sensory deficits. Gait is normal.  REcent fall- discussed fall precautions Hemoptysis- no recurrence. If it does recur she should f/u with pulmonary or oncology Thyroid nodules- probably worth obtaining an [place]- will schedule

## 2013-10-04 NOTE — Progress Notes (Signed)
Pre visit review using our clinic review tool, if applicable. No additional management support is needed unless otherwise documented below in the visit note. 

## 2013-10-09 ENCOUNTER — Ambulatory Visit
Admission: RE | Admit: 2013-10-09 | Discharge: 2013-10-09 | Disposition: A | Discharge status: Home / Self Care | Payer: Medicare Other | Source: Ambulatory Visit | Attending: Internal Medicine | Admitting: Internal Medicine

## 2013-10-09 DIAGNOSIS — E041 Nontoxic single thyroid nodule: Secondary | ICD-10-CM

## 2013-10-09 NOTE — ED Provider Notes (Signed)
Medical screening examination/treatment/procedure(s) were conducted as a shared visit with non-physician practitioner(s) and myself.  I personally evaluated the patient during the encounter.  EKG Interpretation   None      67 year old female presenting after mechanical fall. Workup has been fairly unremarkable. Patient has had hemoptysis since her fall. Given her past history of lung cancer patient was understandably concerned when she began coughing up blood. CT angiogram of her chest was unrevealing though. No significant blood use. Hemodynamically stable. No respiratory distress. This is appropriate for discharge at this time. Emergent return precautions were discussed with the patient and her daughter. Outpatient followup otherwise.  Raeford Razor, MD 10/09/13 [phone]

## 2013-10-18 ENCOUNTER — Encounter: Payer: Self-pay | Admitting: Family Medicine

## 2013-10-18 ENCOUNTER — Ambulatory Visit (INDEPENDENT_AMBULATORY_CARE_PROVIDER_SITE_OTHER): Payer: Medicare Other | Admitting: Family Medicine

## 2013-10-18 ENCOUNTER — Ambulatory Visit (HOSPITAL_BASED_OUTPATIENT_CLINIC_OR_DEPARTMENT_OTHER)
Admission: RE | Admit: 2013-10-18 | Discharge: 2013-10-18 | Disposition: A | Discharge status: Home / Self Care | Payer: Medicare Other | Source: Ambulatory Visit | Attending: Family Medicine | Admitting: Family Medicine

## 2013-10-18 VITALS — BP 129/81 | HR 113 | Temp 99.6°F | Resp 24 | Ht 62.0 in | Wt 176.0 lb

## 2013-10-18 DIAGNOSIS — J4489 Other specified chronic obstructive pulmonary disease: Secondary | ICD-10-CM | POA: Insufficient documentation

## 2013-10-18 DIAGNOSIS — R0989 Other specified symptoms and signs involving the circulatory and respiratory systems: Secondary | ICD-10-CM | POA: Insufficient documentation

## 2013-10-18 DIAGNOSIS — J441 Chronic obstructive pulmonary disease with (acute) exacerbation: Secondary | ICD-10-CM | POA: Insufficient documentation

## 2013-10-18 DIAGNOSIS — J449 Chronic obstructive pulmonary disease, unspecified: Secondary | ICD-10-CM

## 2013-10-18 MED ORDER — IPRATROPIUM BROMIDE 0.02 % IN SOLN
0.5000 mg | Freq: Once | RESPIRATORY_TRACT | Status: AC
  Administered 2013-10-18: 0.5 mg via RESPIRATORY_TRACT

## 2013-10-18 MED ORDER — ALBUTEROL SULFATE (2.5 MG/3ML) 0.083% IN NEBU: 2.5000 mg | INHALATION_SOLUTION | RESPIRATORY_TRACT | Status: DC | PRN

## 2013-10-18 MED ORDER — ALBUTEROL SULFATE (2.5 MG/3ML) 0.083% IN NEBU
2.5000 mg | INHALATION_SOLUTION | Freq: Once | RESPIRATORY_TRACT | Status: AC
  Administered 2013-10-18: 2.5 mg via RESPIRATORY_TRACT

## 2013-10-18 MED ORDER — AZITHROMYCIN 250 MG PO TABS: ORAL_TABLET | ORAL | Status: DC

## 2013-10-18 MED ORDER — METHYLPREDNISOLONE ACETATE 80 MG/ML IJ SUSP
80.0000 mg | Freq: Once | INTRAMUSCULAR | Status: AC
  Administered 2013-10-18: 80 mg via INTRAMUSCULAR

## 2013-10-18 MED ORDER — PREDNISONE 20 MG PO TABS: ORAL_TABLET | ORAL | Status: DC

## 2013-10-18 NOTE — Progress Notes (Signed)
Pre visit review using our clinic review tool, if applicable. No additional management support is needed unless otherwise documented below in the visit note. 

## 2013-10-18 NOTE — Progress Notes (Signed)
OFFICE NOTE  10/18/2013  CC:  Chief Complaint  Patient presents with  . Cough     HPI: Patient is a 67 y.o. Caucasian female who is here for cough. Onset yesterday of worsening nasal congestion, cough.  Also ST and fatigue/generalized weakness.  No known fever. No breathing meds at home.  No otc meds tried.  Denies CP, palpitations, nausea, or left arm sx's. +Hx of COPD.  Lives with daughter, who is here with her and says she wheezes a lot on a chronic basis. Ex smoker, quit 2010 when she found out she had lung cancer.  Pertinent PMH:  Past Medical History  Diagnosis Date  . Diabetes mellitus   . GERD (gastroesophageal reflux disease)   . Lung cancer     non-small cell lung ca, stage III in 05/2011, s/p chemo/radiation  . COPD (chronic obstructive pulmonary disease)   . History of diverticulitis of colon   . CAD (coronary artery disease)     NON obstructive. Cath 2006 preserved LV fxn, scattered irregularities without critical stenosis. 2008 stress echo negative for ischemia, but with hypertensive response  . Hyperlipidemia   . Cancer of lung 12/24/2008    treated with radiation and chemotherapy   Past Surgical History  Procedure Laterality Date  . Appendectomy    . Abdominal hysterectomy  1997  . Carpal tunnel release      MEDS:  Outpatient Prescriptions Prior to Visit  Medication Sig Dispense Refill  . aspirin 81 MG tablet Take 81 mg by mouth daily.      . fluticasone (FLONASE) 50 MCG/ACT nasal spray Place 2 sprays into the nose daily.  16 g  6  . Insulin Isophane & Regular (HUMULIN 70/30 KWIKPEN [place]) Inject 22 Units into the skin 2 (two) times daily.      Marland Kitchen loratadine (CLARITIN) 10 MG tablet Take 10 mg by mouth daily.        . nitroGLYCERIN (NITROSTAT) 0.4 MG SL tablet Place 1 tablet (0.4 mg total) under the tongue every 5 (five) minutes as needed. For chest pain  50 tablet  3  . pantoprazole (PROTONIX) 40 MG tablet Take 1 tablet (40 mg total) by mouth daily.  30 tablet   5  . simvastatin (ZOCOR) 10 MG tablet Take 10 mg by mouth daily at 6 PM.      . Blood Glucose Monitoring Suppl (ONE TOUCH ULTRA 2) W/DEVICE KIT 1 each by Other route daily.      . ONE TOUCH ULTRA TEST test strip 1 each by Other route daily.      Letta Pate DELICA LANCETS 33G MISC 1 each by Other route daily.       No facility-administered medications prior to visit.    PE: Blood pressure 129/81, pulse 113, temperature 99.6 F (37.6 C), temperature source Temporal, resp. rate 24, height 5\' 2"  (1.575 m), weight 176 lb (79.833 kg), SpO2 93.00%. Oxygen sat 89-97% on RA (fluctuated a lot). Gen: tired appearing and mildly labored resps.  NAD. Nontoxic.  Alert, oriented x 4. BJY:NWGN: no injection, icteris, swelling, or exudate.  EOMI, PERRLA. Mouth: lips without lesion/swelling.  Oral mucosa pink and moist. Oropharynx without erythema, exudate, or swelling.  Neck - No masses or thyromegaly or limitation in range of motion CV: Regular, tachy to 120s, no m/r/g LUNGS: decreased BS in both bases, L>R, with minimal prolongation of exp phase and only occ trace exp wheeze.  Frequent coughing.  RR 40-45 (pre-neb).  Improved to low 30s/upper  20s after neb. EXT: no clubbing, cyanosis, or edema.    IMPRESSION AND PLAN:  COPD (chronic obstructive pulmonary disease) Mild improvement subjectively and objectively after duoneb here in office today. Lung exam suspicious for left lung infiltrate. Will check CXR. Depo medrol 80mg  IM today.  Start prednisone 40mg  qd x 5d, then 20mg  qd x 5d (first dose tomorrow). Z-pack rx'd.  Also rx'd albuterol neb solution (patient says she has a neb machine + tubing at home). Signs/symptoms to call or return for were reviewed and pt expressed understanding.    An After Visit Summary was printed and given to the patient.  FOLLOW UP: 4d

## 2013-10-18 NOTE — Assessment & Plan Note (Signed)
Mild improvement subjectively and objectively after duoneb here in office today. Lung exam suspicious for left lung infiltrate. Will check CXR. Depo medrol 80mg  IM today.  Start prednisone 40mg  qd x 5d, then 20mg  qd x 5d (first dose tomorrow). Z-pack rx'd.  Also rx'd albuterol neb solution (patient says she has a neb machine + tubing at home). Signs/symptoms to call or return for were reviewed and pt expressed understanding.

## 2013-10-22 ENCOUNTER — Ambulatory Visit (INDEPENDENT_AMBULATORY_CARE_PROVIDER_SITE_OTHER): Payer: Medicare Other | Admitting: Family Medicine

## 2013-10-22 ENCOUNTER — Encounter: Payer: Self-pay | Admitting: Family Medicine

## 2013-10-22 VITALS — BP 135/80 | HR 93 | Temp 98.0°F | Ht 62.0 in | Wt 173.8 lb

## 2013-10-22 DIAGNOSIS — J441 Chronic obstructive pulmonary disease with (acute) exacerbation: Secondary | ICD-10-CM

## 2013-10-22 NOTE — Progress Notes (Signed)
Pre-visit discussion using our clinic review tool. No additional management support is needed unless otherwise documented below in the visit note.  

## 2013-10-22 NOTE — Progress Notes (Signed)
Pre visit review using our clinic review tool, if applicable. No additional management support is needed unless otherwise documented below in the visit note. OFFICE NOTE  10/22/2013  CC:  Chief Complaint  Patient presents with  . Follow-up    4 days     HPI: Patient is a 67 y.o. Caucasian female who is here for 4d f/u acute exac of COPD. Overall feeling much improved.  Coughing and wheezing more of just night time issue now. She is taking her steroids, antibiotics, nebs.  NO neb this morning yet but otherwise she's been taking this regularly as directed--says it causes tremulousness.    Pertinent PMH:  Past Medical History  Diagnosis Date  . Diabetes mellitus   . GERD (gastroesophageal reflux disease)   . Lung cancer     non-small cell lung ca, stage III in 05/2011, s/p chemo/radiation  . COPD (chronic obstructive pulmonary disease)   . History of diverticulitis of colon   . CAD (coronary artery disease)     NON obstructive. Cath 2006 preserved LV fxn, scattered irregularities without critical stenosis. 2008 stress echo negative for ischemia, but with hypertensive response  . Hyperlipidemia   . Cancer of lung 12/24/2008    treated with radiation and chemotherapy    MEDS:  Outpatient Prescriptions Prior to Visit  Medication Sig Dispense Refill  . albuterol (PROVENTIL) (2.5 MG/3ML) 0.083% nebulizer solution Take 3 mLs (2.5 mg total) by nebulization every 4 (four) hours as needed for wheezing or shortness of breath.  75 mL  6  . aspirin 81 MG tablet Take 81 mg by mouth daily.      Marland Kitchen azithromycin (ZITHROMAX) 250 MG tablet 2 tabs po qd x 1d, then 1 tab po qd x 4d  6 each  0  . Blood Glucose Monitoring Suppl (ONE TOUCH ULTRA 2) W/DEVICE KIT 1 each by Other route daily.      . fluticasone (FLONASE) 50 MCG/ACT nasal spray Place 2 sprays into the nose daily.  16 g  6  . Insulin Isophane & Regular (HUMULIN 70/30 KWIKPEN [place]) Inject 22 Units into the skin 2 (two) times daily.      Marland Kitchen  loratadine (CLARITIN) 10 MG tablet Take 10 mg by mouth daily.        . nitroGLYCERIN (NITROSTAT) 0.4 MG SL tablet Place 1 tablet (0.4 mg total) under the tongue every 5 (five) minutes as needed. For chest pain  50 tablet  3  . ONE TOUCH ULTRA TEST test strip 1 each by Other route daily.      Letta Pate DELICA LANCETS 33G MISC 1 each by Other route daily.      . pantoprazole (PROTONIX) 40 MG tablet Take 1 tablet (40 mg total) by mouth daily.  30 tablet  5  . predniSONE (DELTASONE) 20 MG tablet 2 tabs po qd x 5d, then 1 tab po qd x 5d  15 tablet  0  . simvastatin (ZOCOR) 10 MG tablet Take 10 mg by mouth daily at 6 PM.       No facility-administered medications prior to visit.    PE: Blood pressure 135/80, pulse 93, temperature 98 F (36.7 C), temperature source Temporal, height 5\' 2"  (1.575 m), weight 173 lb 12 oz (78.812 kg), SpO2 94.00%. Gen: Alert, well appearing.  Patient is oriented to person, place, time, and situation. CV: RRR, no m/r/g.   LUNGS: CTA bilat, nonlabored resps, good aeration in all lung fields. Decreased breath sounds on left hemithorax compared  to right as per her previous exam (CXR showed no abnormality). EXT: no clubbing, cyanosis, or edema.    IMPRESSION AND PLAN:  Resolving COPD exacerbation. Finish abx, steroids.  May change albuterol nebs to PRN now instead of q6h scheduled.  An After Visit Summary was printed and given to the patient.  FOLLOW UP: keep scheduled f/u with Dr. Cato Mulligan next year.

## 2013-10-24 DIAGNOSIS — E11319 Type 2 diabetes mellitus with unspecified diabetic retinopathy without macular edema: Secondary | ICD-10-CM

## 2013-10-24 HISTORY — DX: Type 2 diabetes mellitus with unspecified diabetic retinopathy without macular edema: E11.319

## 2013-10-24 LAB — HM DIABETES EYE EXAM

## 2013-10-25 ENCOUNTER — Telehealth: Payer: Self-pay | Admitting: Internal Medicine

## 2013-10-25 ENCOUNTER — Other Ambulatory Visit: Payer: Self-pay | Admitting: *Deleted

## 2013-10-25 MED ORDER — OSELTAMIVIR PHOSPHATE 75 MG PO CAPS: 75.0000 mg | ORAL_CAPSULE | Freq: Every day | ORAL | Status: DC

## 2013-10-25 NOTE — Telephone Encounter (Signed)
Patient Information:  Caller Name: [name] Navassa  Phone: [phone]  Patient: [name], [name]  Gender: Female  DOB: [birth year]  Age: 68 Years  PCP: Phoebe Sharps (Adults only)  Office Follow Up:  Does the office need to follow up with this patient?: Yes  Instructions For The Office: Rx for tamiflu please krs/can  RN Note:  Patient calling about tamiflu order.  States has positive health history for high risk for flu complications.  States got flu shot this winter.  Cared for grandbaby and daughter 10/23/13 and they have been diagnosed with positive flu.  States no current symptoms; wants tamiflu called in.  No standing orders for tamiflu; info to office via Epic/high priority for provider review/Rx/callback.  May reach patient at [phone].  krs/can  Symptoms  Reason For Call & Symptoms: influenza exposure  Reviewed Health History In EMR: Yes  Reviewed Medications In EMR: Yes  Reviewed Allergies In EMR: Yes  Reviewed Surgeries / Procedures: Yes  Date of Onset of Symptoms: Unknown  Guideline(s) Used:  Influenza Exposure  Disposition Per Guideline:   Discuss with PCP and Callback by Nurse within 1 Hour  Reason For Disposition Reached:   Influenza EXPOSURE within last 72 hours (3 days) and exposed person is HIGH RISK (e.g., age > 65 years, pregnant, HIV+, chronic medical condition)  Advice Given:  N/A  Patient Will Follow Care Advice:  YES

## 2013-10-25 NOTE — Telephone Encounter (Signed)
Per dr Shawna Orleans may have tamiflu 75 qd for 10 days--warned it may cause confusion

## 2013-11-15 ENCOUNTER — Emergency Department (HOSPITAL_COMMUNITY)
Admission: EM | Admit: 2013-11-15 | Discharge: 2013-11-15 | Disposition: A | Discharge status: Home / Self Care | Payer: Medicare Other | Attending: Emergency Medicine | Admitting: Emergency Medicine

## 2013-11-15 ENCOUNTER — Emergency Department (HOSPITAL_COMMUNITY): Payer: Medicare Other

## 2013-11-15 ENCOUNTER — Encounter (HOSPITAL_COMMUNITY): Payer: Self-pay | Admitting: Emergency Medicine

## 2013-11-15 DIAGNOSIS — S46912A Strain of unspecified muscle, fascia and tendon at shoulder and upper arm level, left arm, initial encounter: Secondary | ICD-10-CM

## 2013-11-15 DIAGNOSIS — R071 Chest pain on breathing: Secondary | ICD-10-CM | POA: Insufficient documentation

## 2013-11-15 DIAGNOSIS — E119 Type 2 diabetes mellitus without complications: Secondary | ICD-10-CM | POA: Insufficient documentation

## 2013-11-15 DIAGNOSIS — R0789 Other chest pain: Secondary | ICD-10-CM

## 2013-11-15 DIAGNOSIS — Y939 Activity, unspecified: Secondary | ICD-10-CM | POA: Insufficient documentation

## 2013-11-15 DIAGNOSIS — IMO0002 Reserved for concepts with insufficient information to code with codable children: Secondary | ICD-10-CM | POA: Insufficient documentation

## 2013-11-15 DIAGNOSIS — Z79899 Other long term (current) drug therapy: Secondary | ICD-10-CM | POA: Insufficient documentation

## 2013-11-15 DIAGNOSIS — Z87891 Personal history of nicotine dependence: Secondary | ICD-10-CM | POA: Insufficient documentation

## 2013-11-15 DIAGNOSIS — Z794 Long term (current) use of insulin: Secondary | ICD-10-CM | POA: Insufficient documentation

## 2013-11-15 DIAGNOSIS — X58XXXA Exposure to other specified factors, initial encounter: Secondary | ICD-10-CM | POA: Insufficient documentation

## 2013-11-15 DIAGNOSIS — J4489 Other specified chronic obstructive pulmonary disease: Secondary | ICD-10-CM | POA: Insufficient documentation

## 2013-11-15 DIAGNOSIS — K219 Gastro-esophageal reflux disease without esophagitis: Secondary | ICD-10-CM | POA: Insufficient documentation

## 2013-11-15 DIAGNOSIS — Z7982 Long term (current) use of aspirin: Secondary | ICD-10-CM | POA: Insufficient documentation

## 2013-11-15 DIAGNOSIS — Z85118 Personal history of other malignant neoplasm of bronchus and lung: Secondary | ICD-10-CM | POA: Insufficient documentation

## 2013-11-15 DIAGNOSIS — J449 Chronic obstructive pulmonary disease, unspecified: Secondary | ICD-10-CM | POA: Insufficient documentation

## 2013-11-15 DIAGNOSIS — I2510 Atherosclerotic heart disease of native coronary artery without angina pectoris: Secondary | ICD-10-CM | POA: Insufficient documentation

## 2013-11-15 DIAGNOSIS — E785 Hyperlipidemia, unspecified: Secondary | ICD-10-CM | POA: Insufficient documentation

## 2013-11-15 DIAGNOSIS — Y929 Unspecified place or not applicable: Secondary | ICD-10-CM | POA: Insufficient documentation

## 2013-11-15 LAB — CBC
HCT: 39.1 % (ref 36.0–46.0)
HEMOGLOBIN: 13.3 g/dL (ref 12.0–15.0)
MCH: 30.5 pg (ref 26.0–34.0)
MCHC: 34 g/dL (ref 30.0–36.0)
MCV: 89.7 fL (ref 78.0–100.0)
PLATELETS: 220 10*3/uL (ref 150–400)
RBC: 4.36 MIL/uL (ref 3.87–5.11)
RDW: 14.4 % (ref 11.5–15.5)
WBC: 4.8 10*3/uL (ref 4.0–10.5)

## 2013-11-15 LAB — BASIC METABOLIC PANEL
BUN: 13 mg/dL (ref 6–23)
CALCIUM: 8.9 mg/dL (ref 8.4–10.5)
CO2: 24 mEq/L (ref 19–32)
Chloride: 104 mEq/L (ref 96–112)
Creatinine, Ser: 0.71 mg/dL (ref 0.50–1.10)
GFR, EST NON AFRICAN AMERICAN: 89 mL/min — AB (ref >90)
GLUCOSE: 107 mg/dL — AB (ref 70–99)
POTASSIUM: 4.4 meq/L (ref 3.7–5.3)
SODIUM: 142 meq/L (ref 137–147)

## 2013-11-15 LAB — TROPONIN I: Troponin I: <0.3 ng/mL (ref <0.30)

## 2013-11-15 LAB — PRO B NATRIURETIC PEPTIDE: PRO B NATRI PEPTIDE: 50.8 pg/mL (ref 0–125)

## 2013-11-15 LAB — POCT I-STAT TROPONIN I: TROPONIN I, POC: 0 ng/mL (ref 0.00–0.08)

## 2013-11-15 MED ORDER — ACETAMINOPHEN 500 MG PO TABS
1000.0000 mg | ORAL_TABLET | Freq: Once | ORAL | Status: AC
  Administered 2013-11-15: 1000 mg via ORAL
  Filled 2013-11-15: qty 2

## 2013-11-15 NOTE — Discharge Instructions (Signed)
I think your pain is from a muscle strain. Take Tylenol 650 mg every 6 hours for pain. Your EKG, chest x-ray and heart enzymes were all normal. I do not think this pain was from your heart. However, if you have recurrent chest pain, trouble breathing, vomiting or other concerning symptoms, return to the ED. See your doctor early next week.   Chest Pain (Nonspecific) Chest pain has many causes. Your pain could be caused by something serious, such as a heart attack or a blood clot in the lungs. It could also be caused by something less serious, such as a chest bruise or a virus. Follow up with your doctor. More lab tests or other studies may be needed to find the cause of your pain. Most of the time, nonspecific chest pain will improve within 2 to 3 days of rest and mild pain medicine. HOME CARE  For chest bruises, you may put ice on the sore area for 15-20 minutes, 03-04 times a day. Do this only if it makes you feel better.  Put ice in a plastic bag.  Place a towel between the skin and the bag.  Rest for the next 2 to 3 days.  Go back to work if the pain improves.  See your doctor if the pain lasts longer than 1 to 2 weeks.  Only take medicine as told by your doctor.  Quit smoking if you smoke. GET HELP RIGHT AWAY IF:   There is more pain or pain that spreads to the arm, neck, jaw, back, or belly (abdomen).  You have shortness of breath.  You cough more than usual or cough up blood.  You have very bad back or belly pain, feel sick to your stomach (nauseous), or throw up (vomit).  You have very bad weakness.  You pass out (faint).  You have a fever. Any of these problems may be serious and may be an emergency. Do not wait to see if the problems will go away. Get medical help right away. Call your local emergency services 911 in [place] Do not drive yourself to the hospital. MAKE SURE YOU:   Understand these instructions.  Will watch this condition.  Will get help right away if  you or your child is not doing well or gets worse. Document Released: 03/28/2008 Document Revised: 01/02/2012 Document Reviewed: 03/28/2008 [facility] Patient Information 2014 [place], [place].

## 2013-11-15 NOTE — ED Provider Notes (Signed)
CSN: [id number]     Arrival date & time 11/15/13  1230 History   First MD Initiated Contact with Patient 11/15/13 1508     Chief Complaint  Patient presents with  . Chest Pain    HPI: [name] is a 68 yo F with history of DM, GERD, NSCLC, currently in remission, COPD, non-obstructive CAD and HLD who presents with chest pain. Symptoms started four hours prior to arrival while she was sitting on the couch. Pain left sided, described as sharp, lasted for a few seconds. She then started to have posterior shoulder pain, described as squeezing which has been constant. Pain is worse with palpation, not pleuritic or exertional in nature, no associated symptoms. She has not had pain similar to this in the past. She took nitro X 2 with some relief of pain. Currently 5/10. She was concerned she may be having a heart attack so she presents for evaluation.    Past Medical History  Diagnosis Date  . Diabetes mellitus   . GERD (gastroesophageal reflux disease)   . Lung cancer     non-small cell lung ca, stage III in 05/2011, s/p chemo/radiation  . COPD (chronic obstructive pulmonary disease)   . History of diverticulitis of colon   . CAD (coronary artery disease)     NON obstructive. Cath 2006 preserved LV fxn, scattered irregularities without critical stenosis. 2008 stress echo negative for ischemia, but with hypertensive response  . Hyperlipidemia   . Cancer of lung 12/24/2008    treated with radiation and chemotherapy   Past Surgical History  Procedure Laterality Date  . Appendectomy    . Abdominal hysterectomy  1997  . Carpal tunnel release     Family History  Problem Relation Age of Onset  . Heart failure Mother   . Kidney disease Mother     renal failure  . Diabetes Mother   . Diabetes Father   . Diabetes Brother   . Heart disease Brother   . Heart attack Brother   . Heart attack Brother   . Heart attack Brother    History  Substance Use Topics  . Smoking status: Former Smoker --  1.00 packs/day for 45 years    Types: Cigarettes    Quit date: 10/24/2008  . Smokeless tobacco: Not on file  . Alcohol Use: No   OB History   Grav Para Term Preterm Abortions TAB SAB Ect Mult Living                  Review of Systems  Constitutional: Negative for fever, chills, appetite change and fatigue.  Eyes: Negative for photophobia and visual disturbance.  Respiratory: Negative for cough and shortness of breath.   Cardiovascular: Positive for chest pain. Negative for leg swelling.  Gastrointestinal: Negative for nausea, vomiting, abdominal pain, diarrhea and constipation.  Genitourinary: Negative for dysuria, frequency and decreased urine volume.  Musculoskeletal: Positive for myalgias (left posterior shoulder). Negative for arthralgias, back pain and gait problem.  Skin: Negative for color change and wound.  Neurological: Negative for dizziness, syncope, light-headedness and headaches.  Psychiatric/Behavioral: Negative for confusion and agitation.  All other systems reviewed and are negative.     Allergies  Lactose intolerance (gi) and Ibuprofen  Home Medications   Current Outpatient Rx  Name  Route  Sig  Dispense  Refill  . albuterol (PROVENTIL) (2.5 MG/3ML) 0.083% nebulizer solution   Nebulization   Take 3 mLs (2.5 mg total) by nebulization every 4 (four) hours as  needed for wheezing or shortness of breath.   75 mL   6   . aspirin 81 MG tablet   Oral   Take 81 mg by mouth at bedtime.          . Blood Glucose Monitoring Suppl (ONE TOUCH ULTRA 2) W/DEVICE KIT   Other   1 each by Other route daily.         . fluticasone (FLONASE) 50 MCG/ACT nasal spray   Each Nare   Place 2 sprays into both nostrils at bedtime.         . Insulin Isophane & Regular (HUMULIN 70/30 KWIKPEN [place])   Subcutaneous   Inject 22 Units into the skin 2 (two) times daily.         Marland Kitchen loratadine (CLARITIN) 10 MG tablet   Oral   Take 10 mg by mouth at bedtime.          .  nitroGLYCERIN (NITROSTAT) 0.4 MG SL tablet   Sublingual   Place 1 tablet (0.4 mg total) under the tongue every 5 (five) minutes as needed. For chest pain   50 tablet   3   . ONE TOUCH ULTRA TEST test strip   Other   1 each by Other route daily.         Glory Rosebush DELICA LANCETS 51W MISC   Other   1 each by Other route daily.         . pantoprazole (PROTONIX) 40 MG tablet   Oral   Take 40 mg by mouth at bedtime.         . simvastatin (ZOCOR) 10 MG tablet   Oral   Take 10 mg by mouth at bedtime.           BP 100/79  Pulse 86  Temp(Src) 98.9 F (37.2 C) (Oral)  Resp 18  Ht 5' 2"  (1.575 m)  SpO2 95% Physical Exam  Nursing note and vitals reviewed. Constitutional: She is oriented to person, place, and time. No distress.  Elderly female, laying in bed, appears comfortable.   HENT:  Head: Normocephalic and atraumatic.  Mouth/Throat: Oropharynx is clear and moist.  Eyes: Conjunctivae and EOM are normal. Pupils are equal, round, and reactive to light.  Neck: Normal range of motion. Neck supple.  Cardiovascular: Normal rate, regular rhythm, normal heart sounds and intact distal pulses.   Pulmonary/Chest: Effort normal and breath sounds normal. No respiratory distress.  TTP diffusely over posterior left shoulder, no anterior chest wall tenderness. No rash or vesicles noted.   Abdominal: Soft. Bowel sounds are normal. There is no tenderness. There is no rebound and no guarding.  Musculoskeletal: Normal range of motion. She exhibits no edema and no tenderness.  Neurological: She is alert and oriented to person, place, and time. No cranial nerve deficit. Coordination normal.  Skin: Skin is warm and dry. No rash noted.  Psychiatric: She has a normal mood and affect. Her behavior is normal.    ED Course  Procedures (including critical care time) Labs Review Labs Reviewed  BASIC METABOLIC PANEL - Abnormal; Notable for the following:    Glucose, Bld 107 (*)    GFR calc  non Af Amer 89 (*)    All other components within normal limits  CBC  PRO B NATRIURETIC PEPTIDE  TROPONIN I  POCT I-STAT TROPONIN I   Imaging Review Dg Chest 2 View  11/15/2013   CLINICAL DATA:  Chest and back pain.  EXAM: CHEST  2  VIEW  COMPARISON:  Two-view chest x-ray 10/18/2013.  CT chest 10/02/2013.  FINDINGS: Post radiation scar tissue is again noted in the right upper lobe. Heart size is normal. The lungs are otherwise clear. The visualized soft tissues and bony thorax are unremarkable.  IMPRESSION: 1. No acute cardiopulmonary disease or significant interval change. 2. 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   Electronically Signed   By: Lawrence Santiago M.D.   On: 11/15/2013 13:44    EKG Interpretation    Date/Time:  Friday November 15 2013 12:34:03 EST Ventricular Rate:  99 PR Interval:  150 QRS Duration: 82 QT Interval:  390 QTC Calculation: 500 R Axis:   -20 Text Interpretation:  Normal sinus rhythm Inferior infarct , age undetermined Anterior infarct , age undetermined Abnormal ECG No significant change was found Confirmed by Wyvonnia Dusky  MD, STEPHEN [phone] on 11/15/2013 12:40:50 PM            MDM   1. Muscle strain of left shoulder   2. Chest wall pain     68 yo F with history of DM, GERD, NSCLC, currently in remission, COPD, non-obstructive CAD and HLD who presents with chest pain. Afebrile, vital signs stable. ECG shows SR without ischemic changes or arrythmia. CXR without consolidation, widened mediastinum, effusion, PTX or other cause for her pain. Her chest pain is atypical for ACS given it's sharp character and no associated symptoms. Left posterior shoulder pain is reproducible on exam. Low suspicion for PE given pain is atypical for this, recent CTA negative for PE, not hypoxic, pain not pleuritic. We obtained a delta troponin and both were negative, doubt ACS given negative troponin's, no ischemic changes on ECG and atypical pain. BMP and CBC both were  normal. NO infectious symptoms. Her pain improved with Tylenol. Felt her pain was likely MSK in nature. Given she is well appearing, normal vitals, pain improved, reassuring w/u, will DC home with PCP f/u. Advised to return for new or worsening symptoms. She was in agreement with plan.   Reviewed imaging, ECG, labs and previous medical records, utilized in MDM  Discussed case with Dr. Tawnya Crook.       Louretta Shorten, MD 11/17/13 [phone]

## 2013-11-15 NOTE — ED Notes (Signed)
Pt reports that she was sitting watching TV and had a pain shoot through her chest and down her left arm. Reports some SOB, denies any n/v. Reports hx of the same in the past, but never seen a cardiologist. Denies any cough or fever at home.

## 2013-11-15 NOTE — ED Notes (Signed)
MD at bedside. 

## 2013-11-17 NOTE — ED Provider Notes (Signed)
Medical screening examination/treatment/procedure(s) were conducted as a shared visit with resident-physician practitioner(s) and myself.  I personally evaluated the patient during the encounter.  Pt is a 68 y.o. female with pmhx as above presenting with atypical chest pain and shoulder pain.  Pt found to have reproducible shoulder and chest wall pain.  No ischemic changes on EKG, nml CXR, nml delta trop. Will d/c home w/ close PCP f/u.  EKG Interpretation    Date/Time:  Friday November 15 2013 12:34:03 EST Ventricular Rate:  99 PR Interval:  150 QRS Duration: 82 QT Interval:  390 QTC Calculation: 500 R Axis:   -20 Text Interpretation:  Normal sinus rhythm Inferior infarct , age undetermined Anterior infarct , age undetermined Abnormal ECG No significant change was found Confirmed by Wyvonnia Dusky  MD, STEPHEN ([id number]) on 11/15/2013 12:40:50 PM             Neta Ehlers, MD 11/17/13 [phone]

## 2013-11-20 ENCOUNTER — Telehealth: Payer: Self-pay | Admitting: Internal Medicine

## 2013-11-20 NOTE — Telephone Encounter (Signed)
Relevant patient education assigned to patient using Emmi. ° °

## 2013-12-02 ENCOUNTER — Other Ambulatory Visit: Payer: Self-pay | Admitting: Internal Medicine

## 2013-12-07 IMAGING — CR DG CHEST 1V PORT
1 series · 1 of 1 positions shown · non-contrast
Comparison: CT chest dated 08/17/2012

CLINICAL DATA: Chest pain, cough

PORTABLE CHEST - 1 VIEW

[AP]
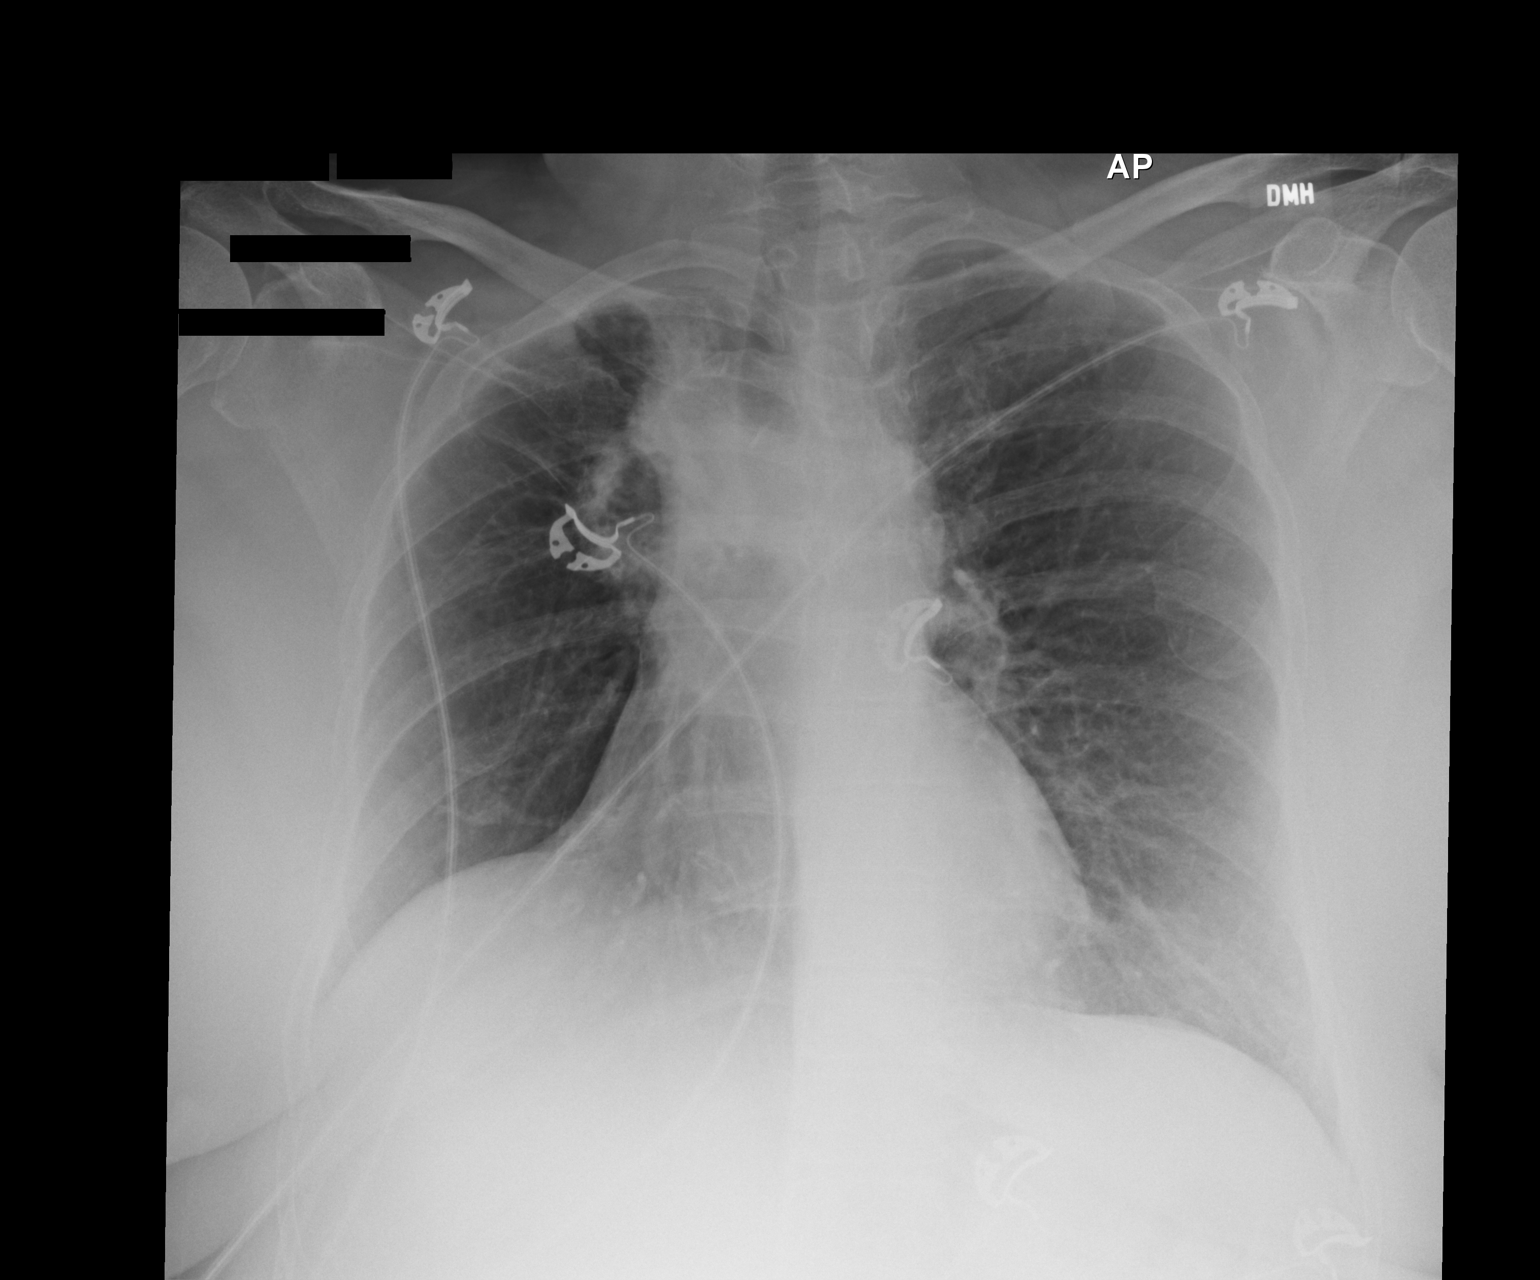

[1 of 1 positions shown; findings below may reference images not displayed]

FINDINGS: Opacity / fibrosis in the medial right upper lobe, likely
reflecting radiation changes when correlating the prior CT.

Volume loss in the right hemithorax.

No pleural effusion or pneumothorax.

The heart is normal in size.
IMPRESSION: No evidence of acute cardiopulmonary disease.

Suspected radiation changes in the medial right upper lobe.

## 2013-12-07 IMAGING — CT CT ABD-PELV W/ CM
2 of 5 series · 17 of 46 positions shown, 19 images · IV contrast (omnipaque)
Comparison: 05/28/2012

CLINICAL DATA: Abdominal pain, nausea/vomiting

CT ABDOMEN AND PELVIS WITH CONTRAST
TECHNIQUE: Multidetector CT imaging of the abdomen and pelvis was
performed following the standard protocol during bolus
administration of intravenous contrast.
Contrast: 100mL OMNIPAQUE IOHEXOL 300 MG/ML  SOLN

[Series 2: abd/pelv with 5.0 b31f st · axial · 0.72mm/px · z∈[-443,-43]mm · 14 of 90 slices shown, 16 images]
[im 5/90  soft-tissue]
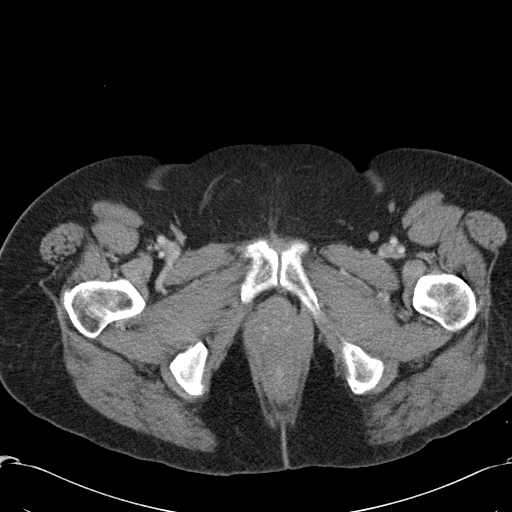
[im 5/90  bone]
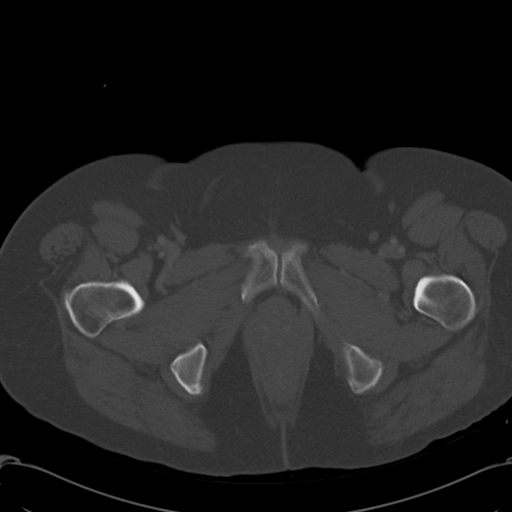
[im 14/90  soft-tissue]
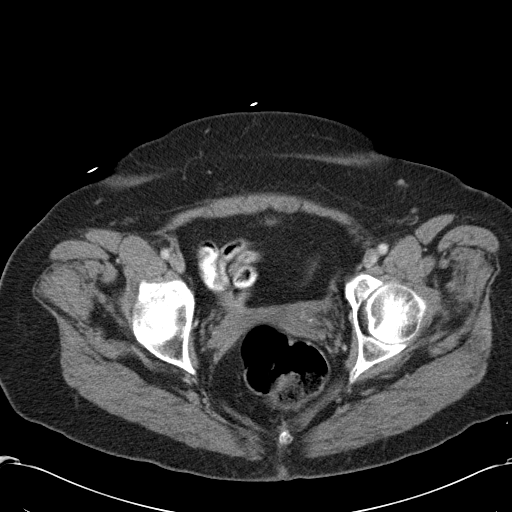
[im 18/90  soft-tissue]
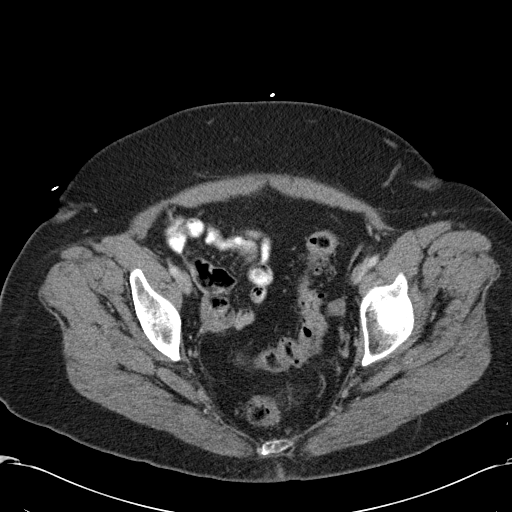
[im 23/90  soft-tissue]
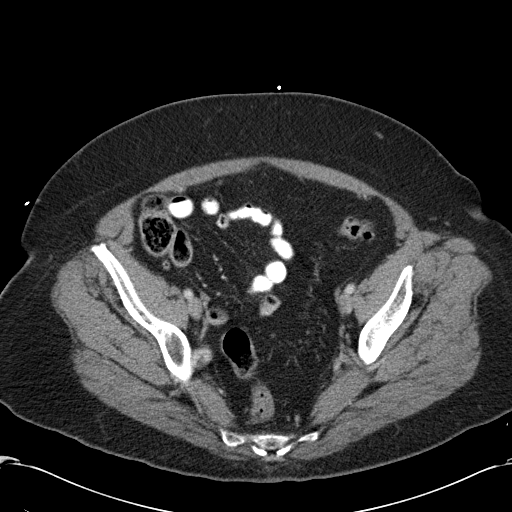
[im 32/90  soft-tissue]
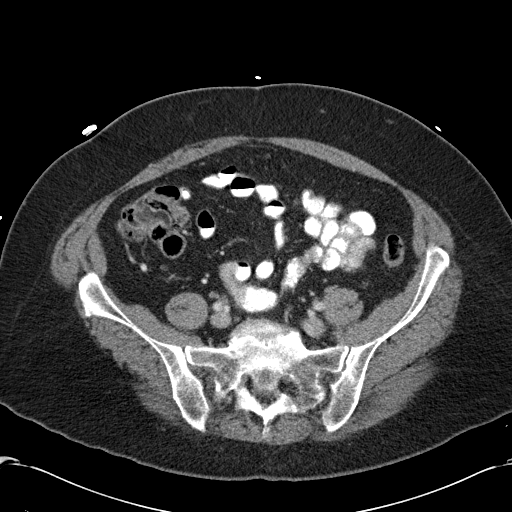
[im 36/90  soft-tissue]
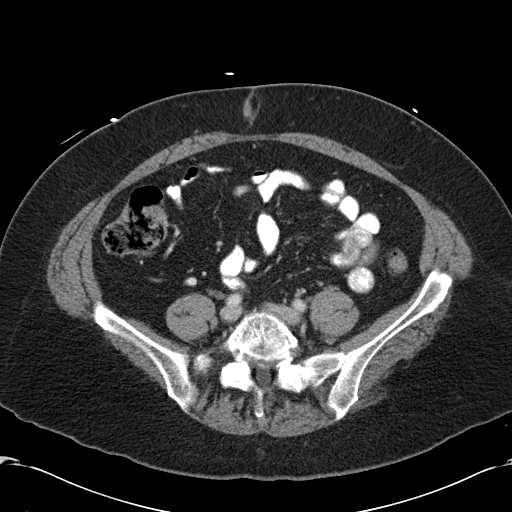
[im 41/90  soft-tissue]
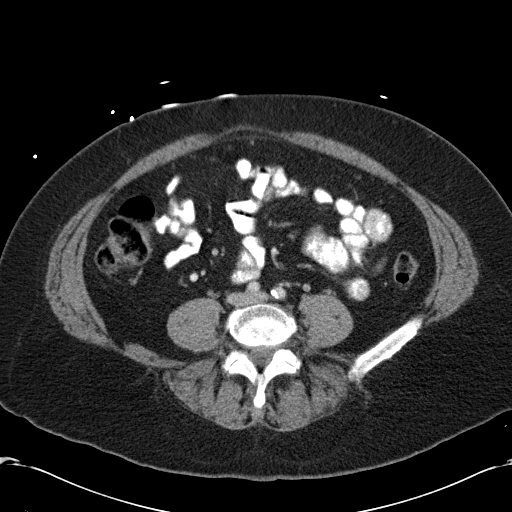
[im 49/90  soft-tissue]
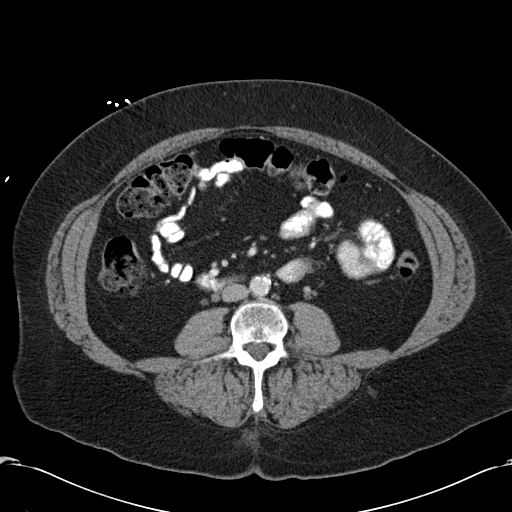
[im 54/90  soft-tissue]
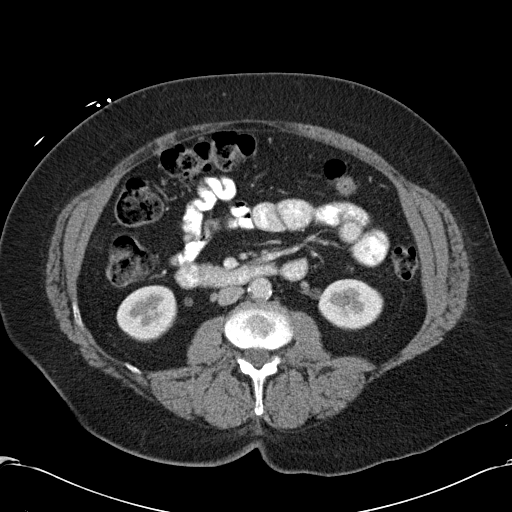
[im 54/90  bone]
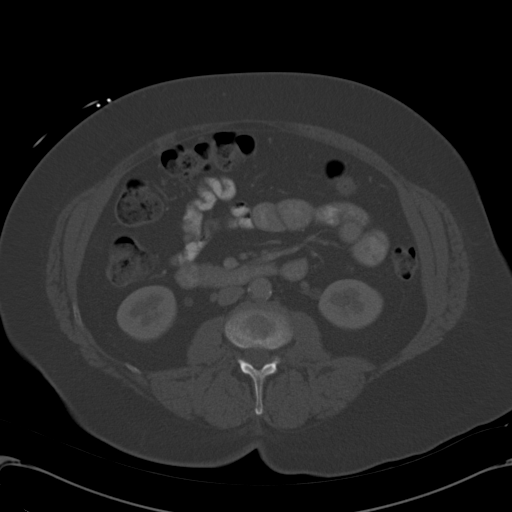
[im 58/90  soft-tissue]
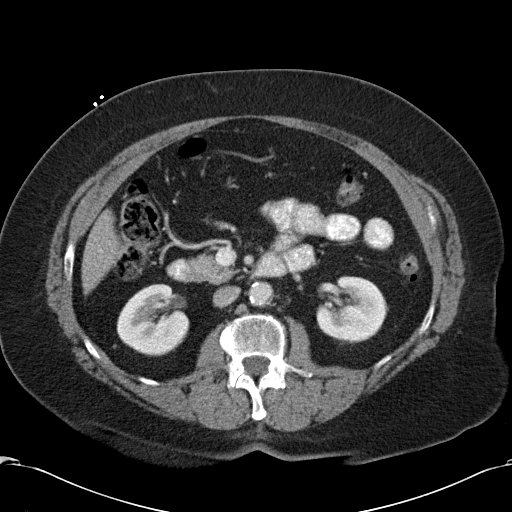
[im 67/90  soft-tissue]
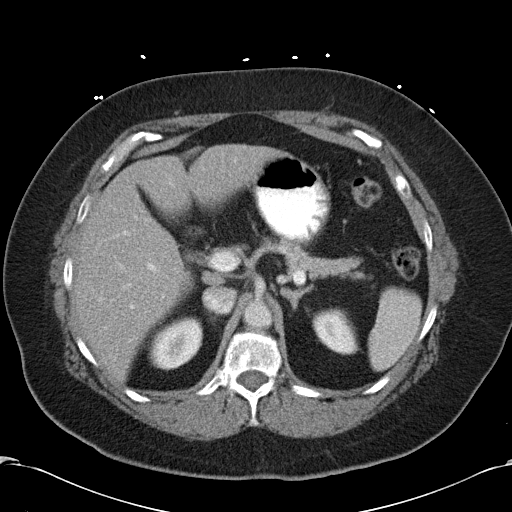
[im 72/90  soft-tissue]
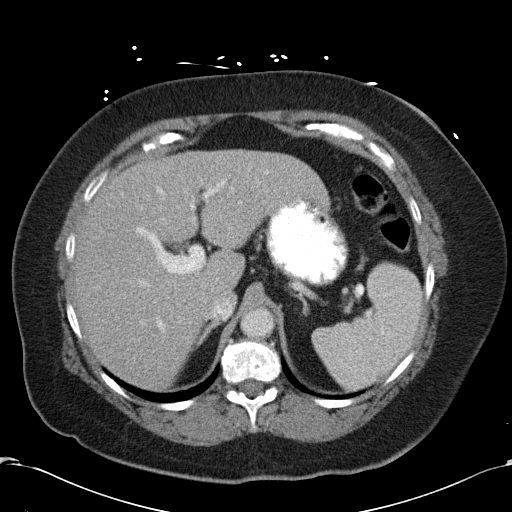
[im 76/90  soft-tissue]
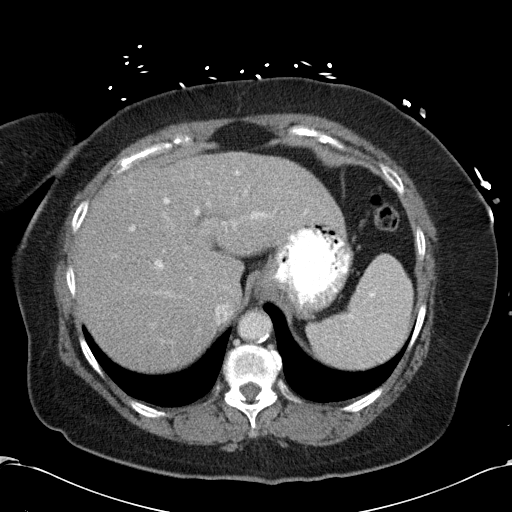
[im 85/90  soft-tissue]
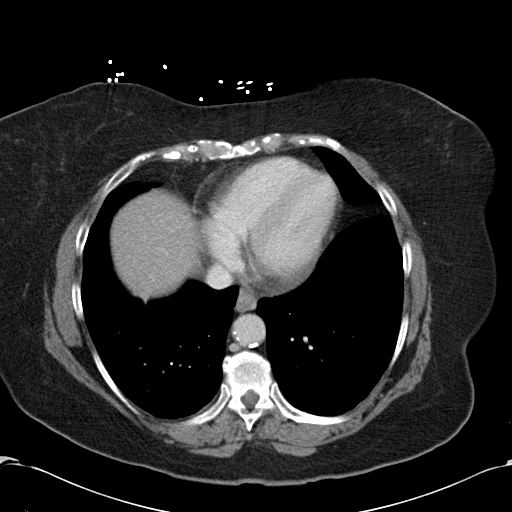

[Series 5: coronals · coronal · 0.90mm/px · 3 of 127 slices shown]
[im 43/127  soft-tissue]
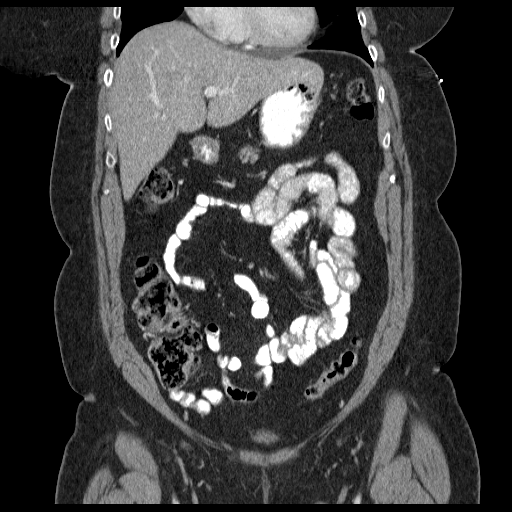
[im 57/127  soft-tissue]
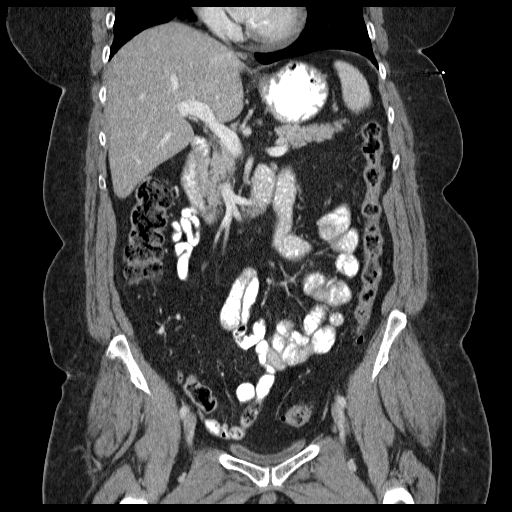
[im 71/127  soft-tissue]
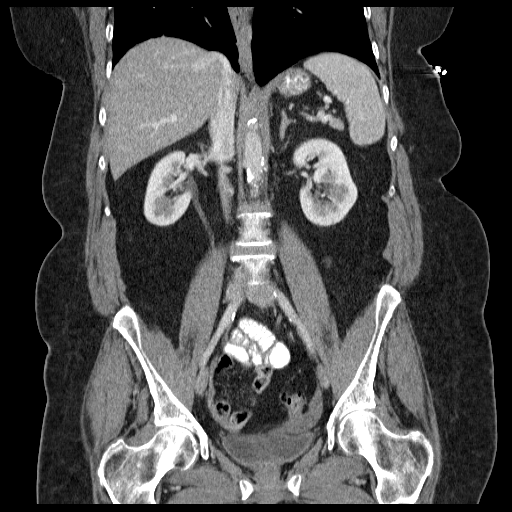

[17 of 46 positions shown; findings below may reference images not displayed]

FINDINGS: Lung bases are clear.

9 mm hypervascular lesion in the anterior hepatic dome (series
2/image 11), grossly unchanged, likely reflecting a flash filling
hemangioma.  Mildly nodular contour of the left hepatic lobe
(series 2/image 22), nonspecific.

Spleen, pancreas, and adrenal glands are within normal limits.

Status post cholecystectomy.  No intrahepatic or extrahepatic
ductal dilatation.

Kidneys are within normal limits.  No hydronephrosis.

No evidence of bowel obstruction.  Normal appendix.  Colonic
diverticulosis, without associated inflammatory changes.

Atherosclerotic calcifications of the abdominal aorta and branch
vessels.

No abdominopelvic ascites.

No suspicious abdominopelvic lymphadenopathy.

Status post hysterectomy.  No adnexal masses.

No ureteral or bladder calculi.  Bladder is underdistended.

Degenerative changes of the visualized thoracolumbar spine.
IMPRESSION: Normal appendix.  No evidence of bowel obstruction.

No CT findings to account for the patient's abdominal pain.

## 2013-12-07 IMAGING — US US ABDOMEN COMPLETE
1 series · 14 of 25 positions shown · non-contrast
Comparison: CT abdomen pelvis dated 05/28/2012

CLINICAL DATA: Abdominal pain.  Prior cholecystectomy.  History of
lung cancer.

COMPLETE ABDOMINAL ULTRASOUND

[Series 1: us abdomen complete · 0.31mm/px · 14 of 53 slices shown]
[im 1/53]
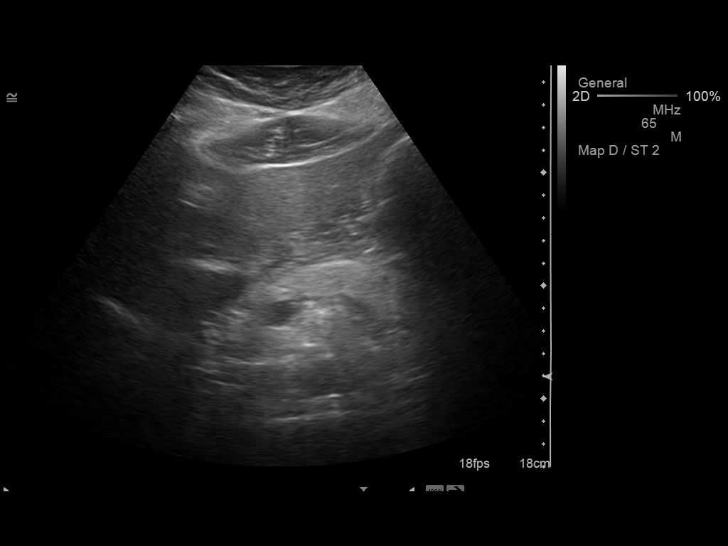
[im 5/53]
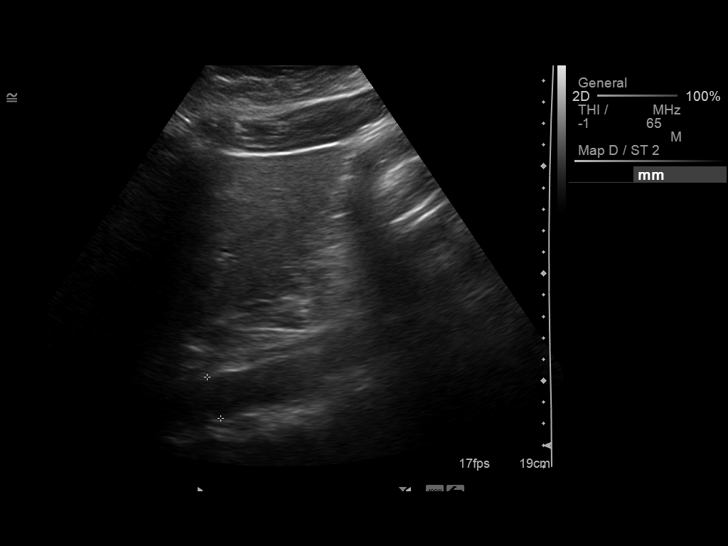
[im 9/53]
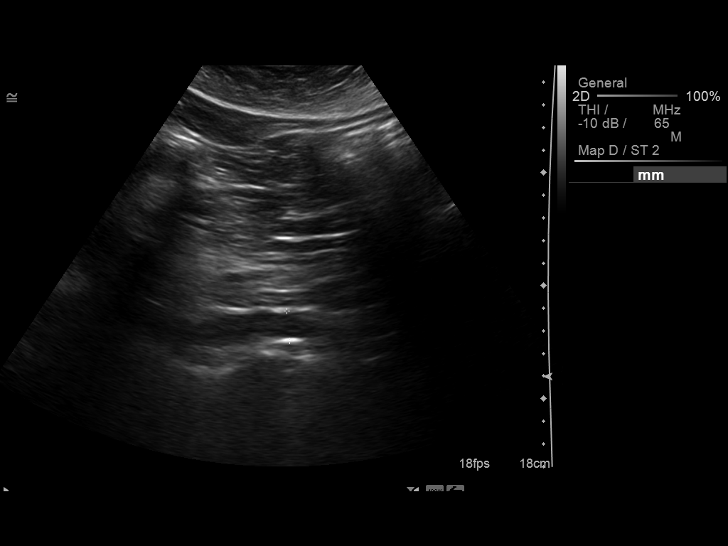
[im 14/53]
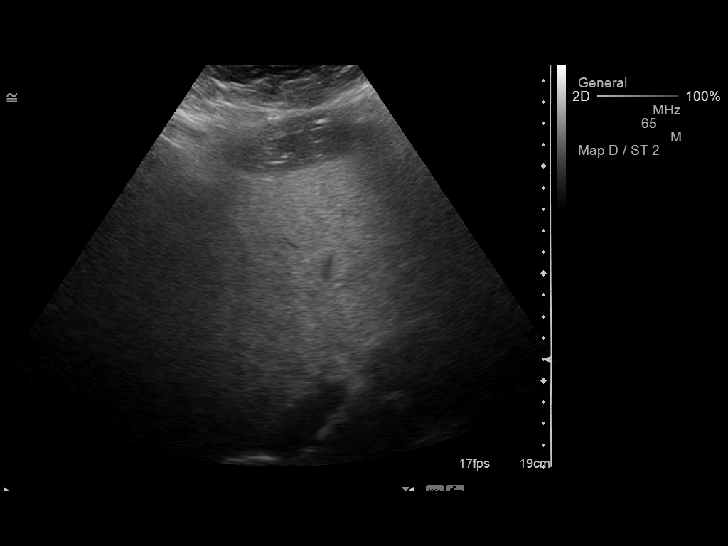
[im 18/53]
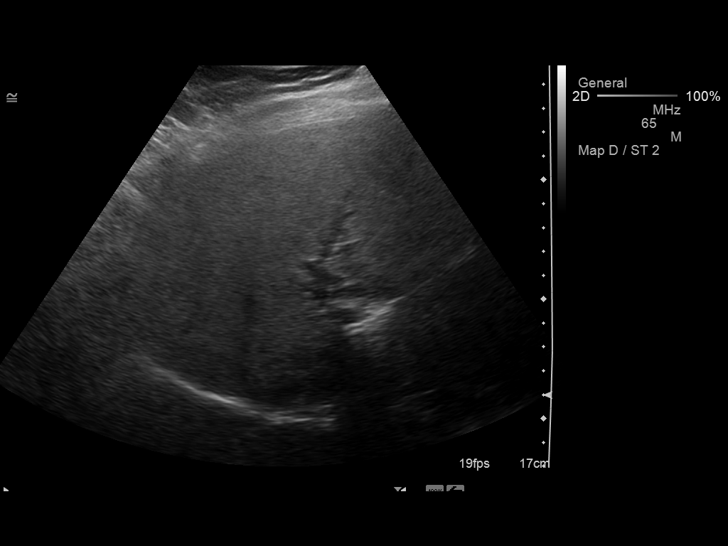
[im 20/53]
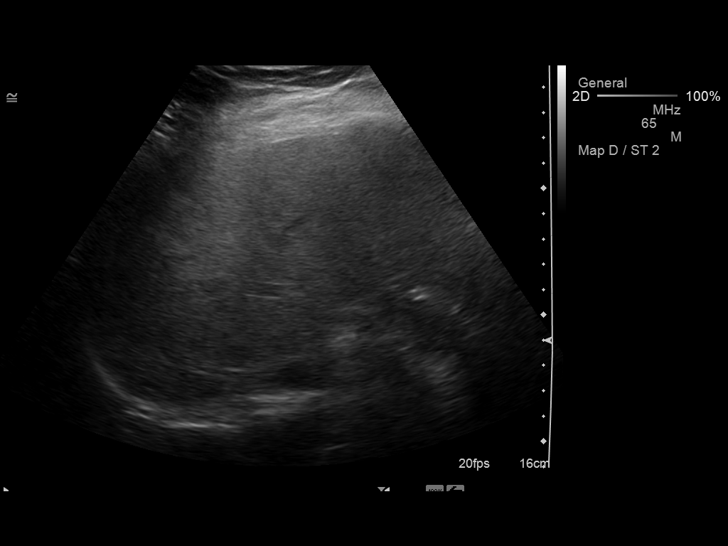
[im 24/53]
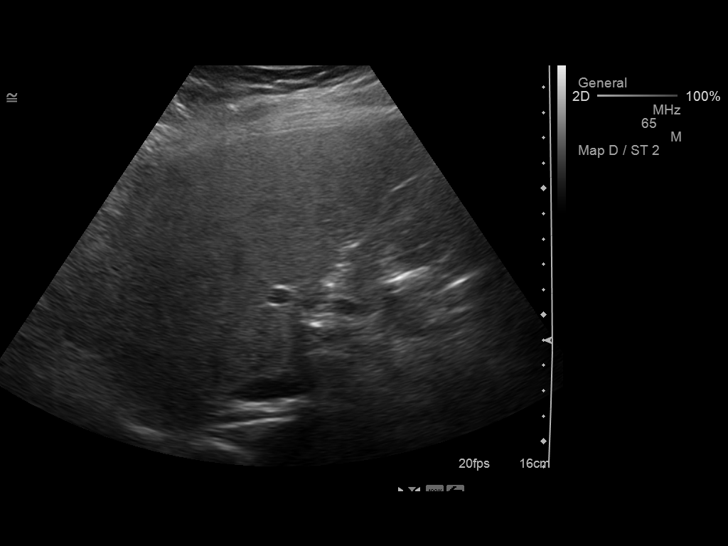
[im 29/53]
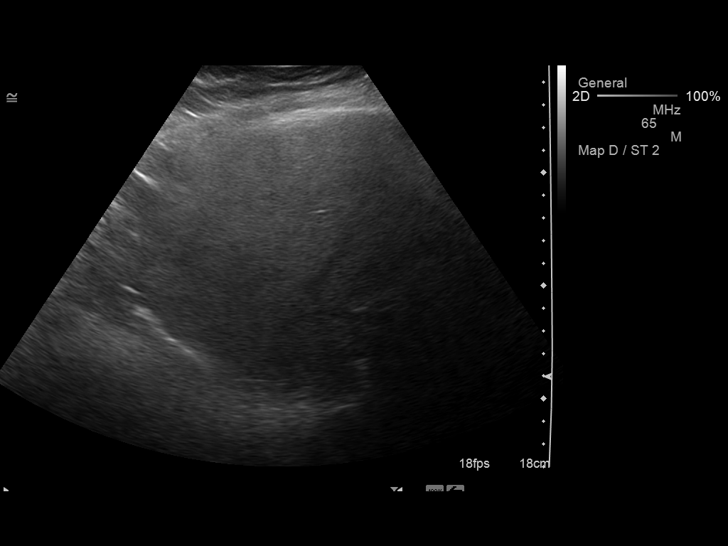
[im 33/53]
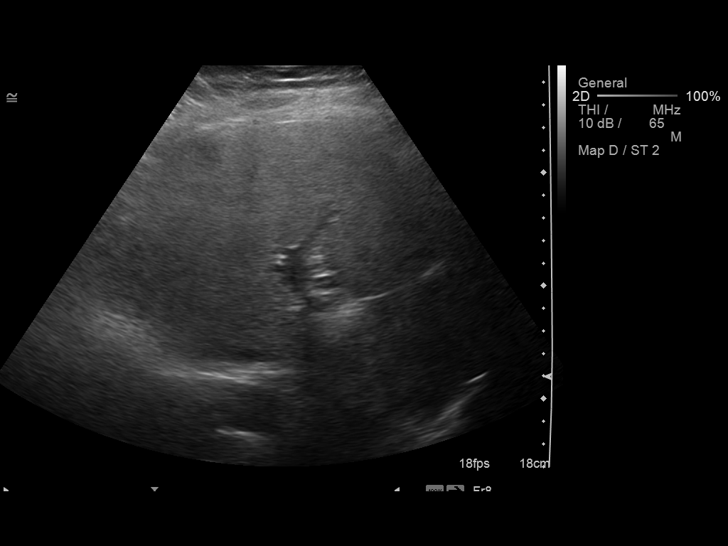
[im 35/53]
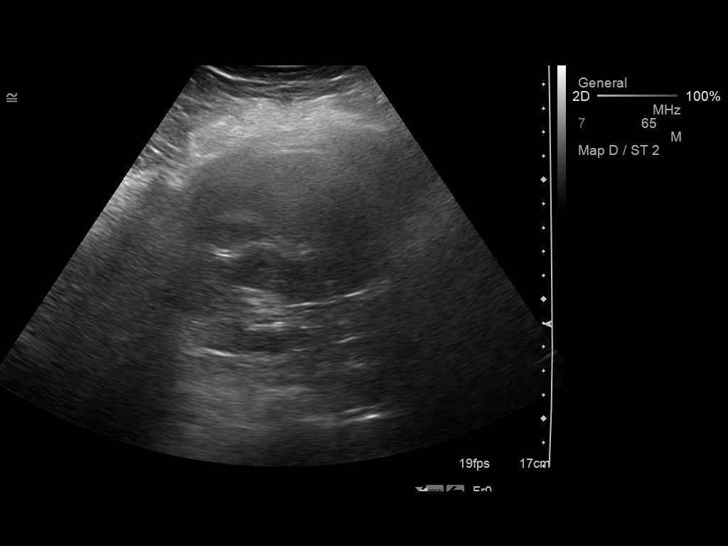
[im 40/53]
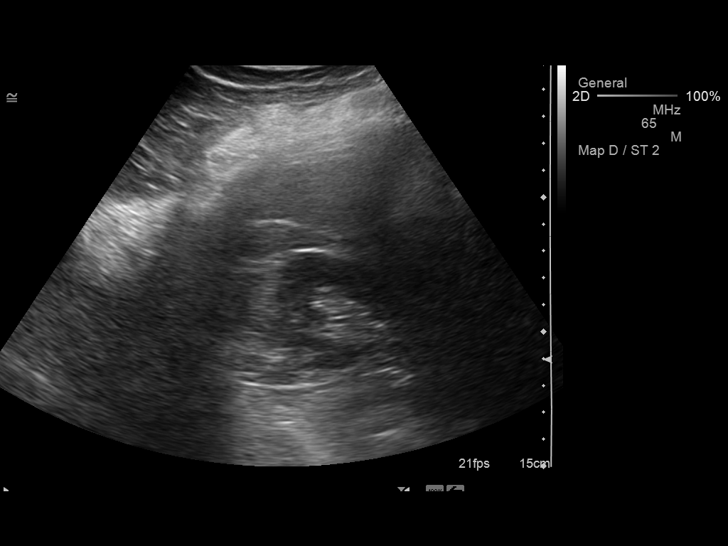
[im 44/53]
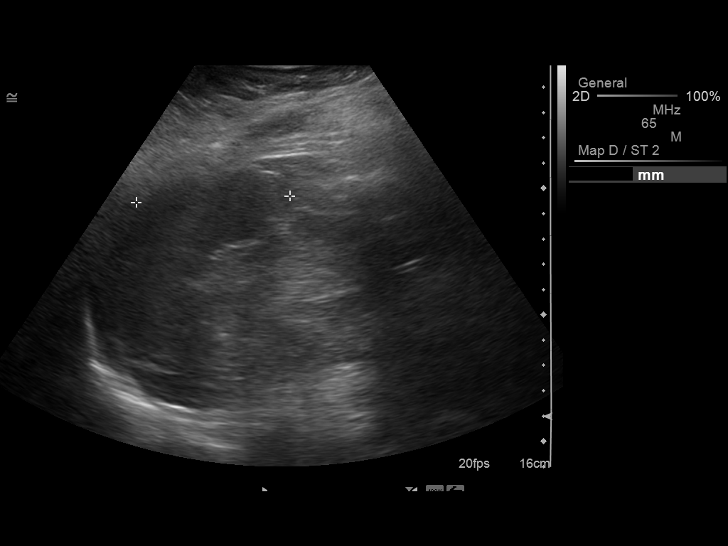
[im 48/53]
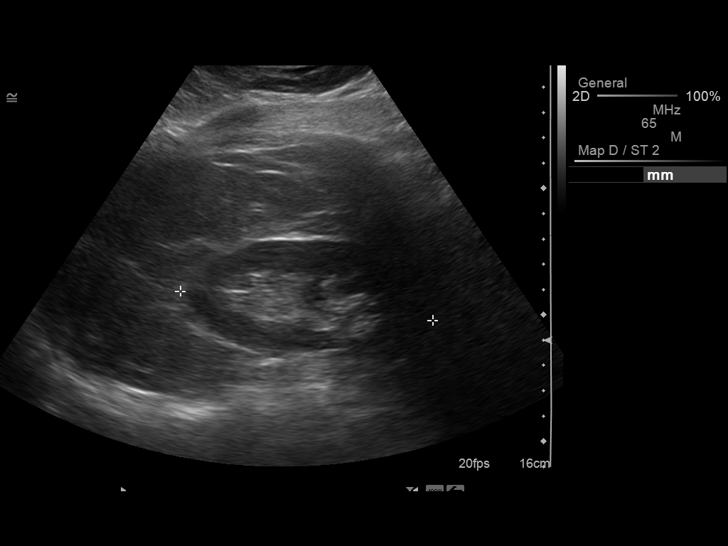
[im 53/53]
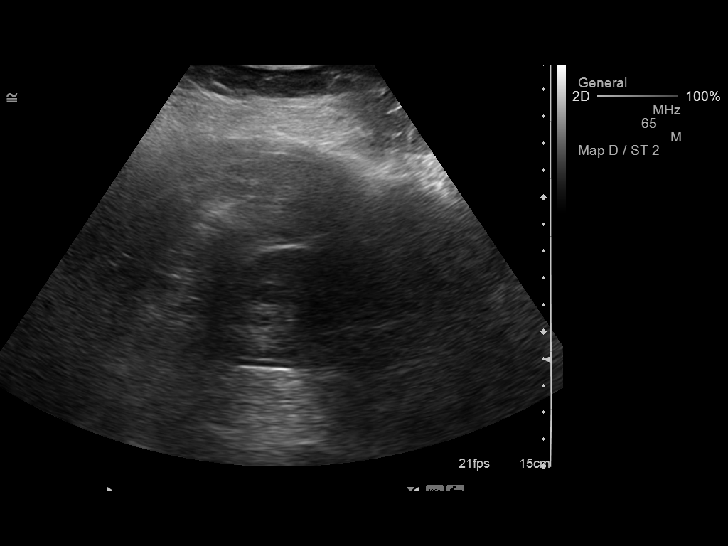

[14 of 25 positions shown; findings below may reference images not displayed]

FINDINGS: Gallbladder:  Surgically absent.

Common bile duct:  Measures 7 mm.

Liver:  Coarse, echogenic hepatic parenchyma with mildly nodular
hepatic contour.  No focal hepatic lesion is seen.

IVC:  Appears normal.

Pancreas:  Incompletely visualized but grossly unremarkable.

Spleen:  Measures 6.1 cm.

Right Kidney:  Measures 10.0 cm.  No mass or hydronephrosis.

Left Kidney:  Measures 10.0 cm.  No mass or hydronephrosis.

Abdominal aorta:  No aneurysm identified.
IMPRESSION: Status post cholecystectomy.

Suspected hepatic steatosis.  Early mild cirrhosis is possible.

## 2013-12-13 ENCOUNTER — Telehealth: Payer: Self-pay | Admitting: Internal Medicine

## 2013-12-13 NOTE — Telephone Encounter (Signed)
pt was aware at store////amiled pt appt sched/avs and letter

## 2013-12-22 IMAGING — CT CT CHEST W/ CM
2 of 4 series · 15 of 36 positions shown, 18 images · IV contrast (OMNIPAQUE)
Comparison: 08/17/2012

CLINICAL DATA: Lung cancer

CT CHEST WITH CONTRAST
TECHNIQUE: Multidetector CT imaging of the chest was performed
following the standard protocol during bolus administration of
intravenous contrast.
Contrast: 80mL OMNIPAQUE IOHEXOL 300 MG/ML  SOLN 08/17/2012

[Series 2: chest with st · axial · 0.74mm/px · z∈[-115,+125]mm · 12 of 58 slices shown, 15 images]
[im 5/58  mediastinal]
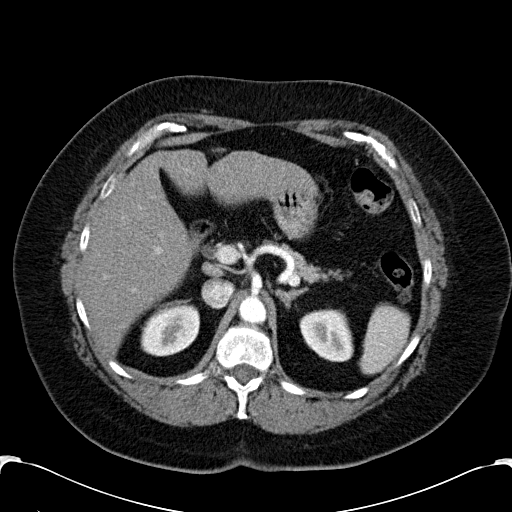
[im 5/58  lung]
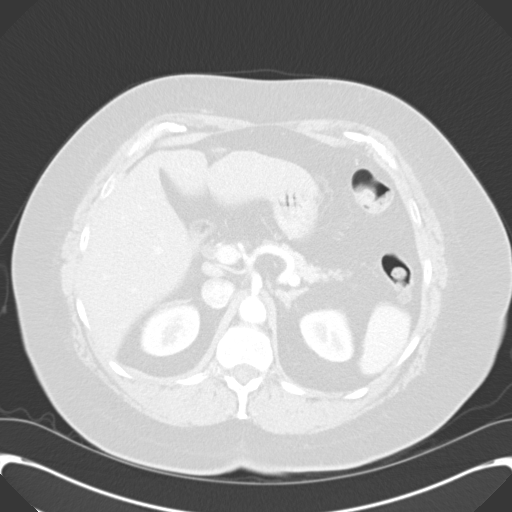
[im 9/58  lung]
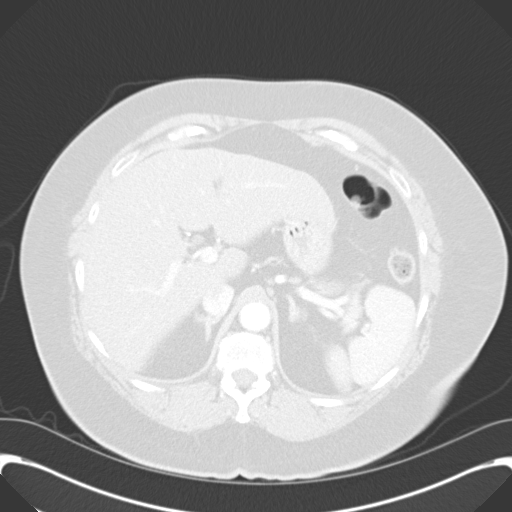
[im 13/58  lung]
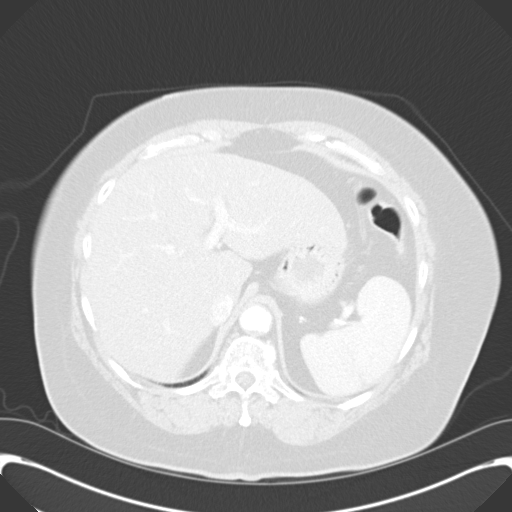
[im 17/58  lung]
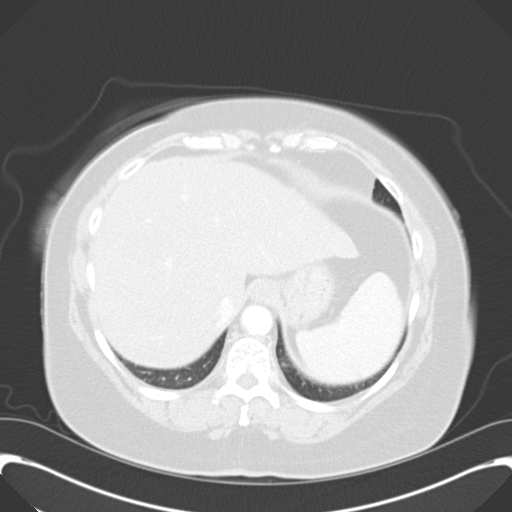
[im 21/58  mediastinal]
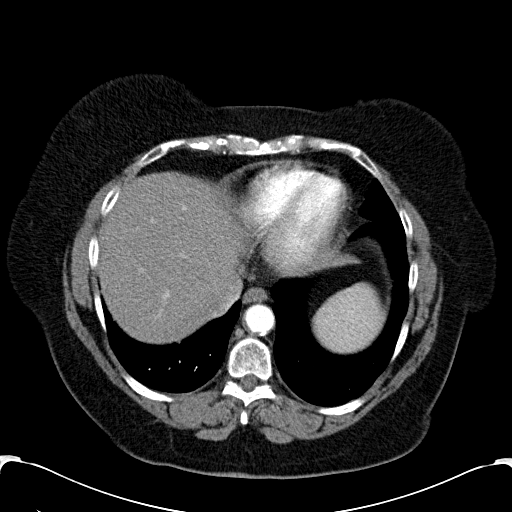
[im 21/58  lung]
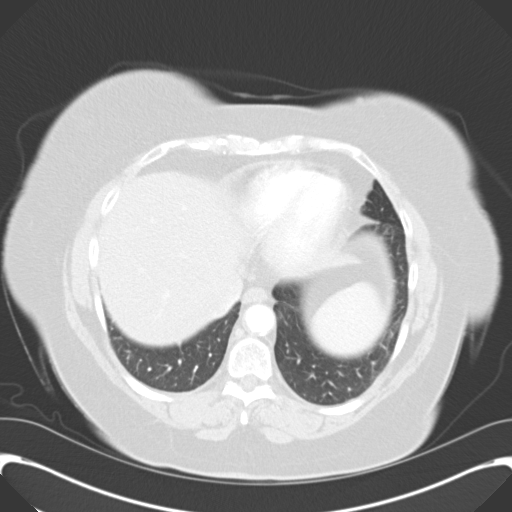
[im 25/58  lung]
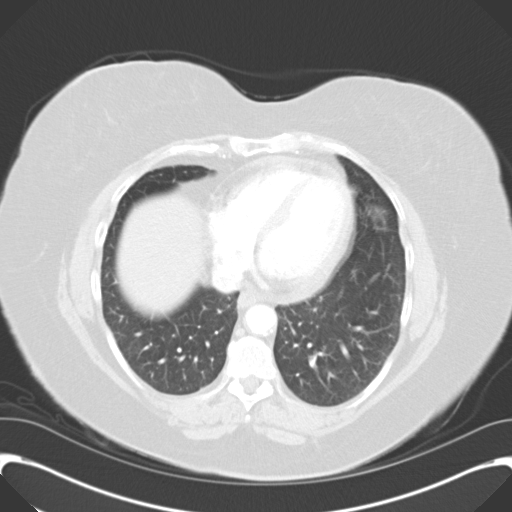
[im 33/58  lung]
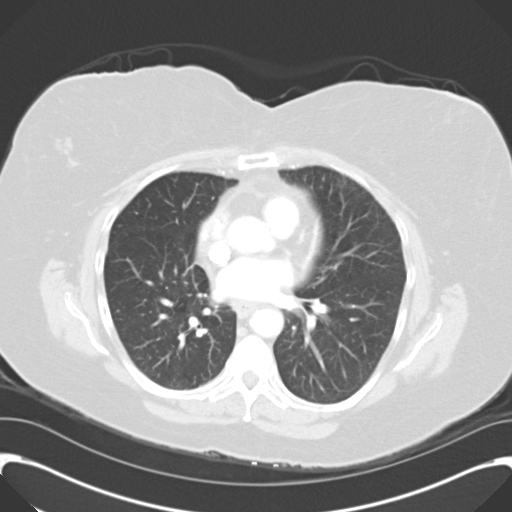
[im 37/58  lung]
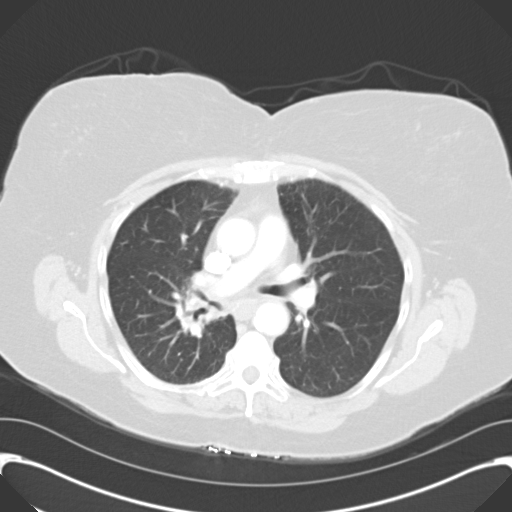
[im 41/58  mediastinal]
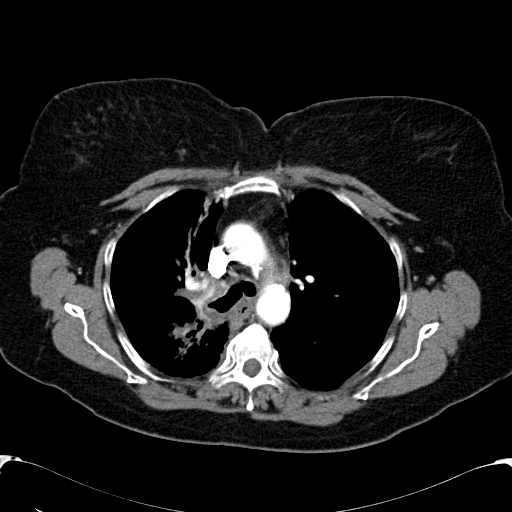
[im 41/58  lung]
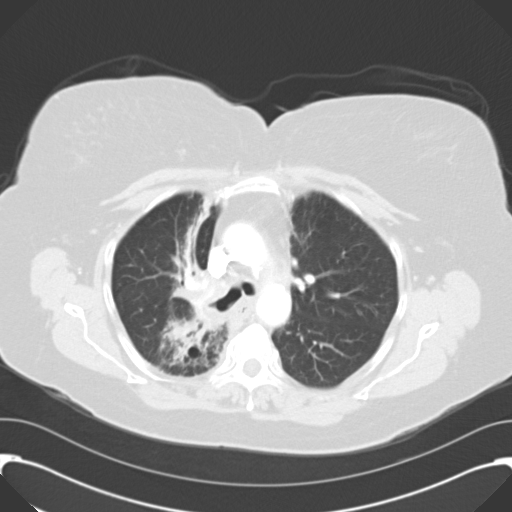
[im 45/58  lung]
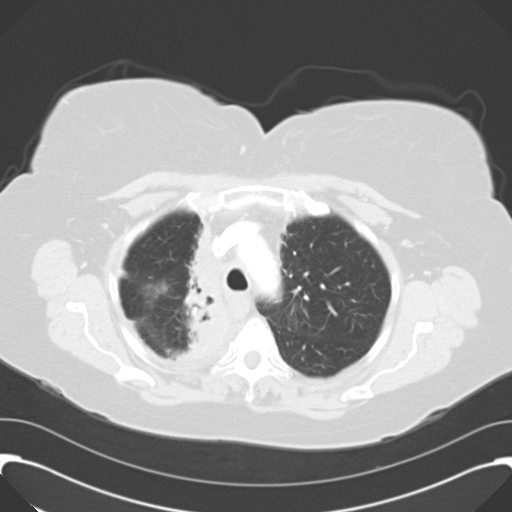
[im 49/58  lung]
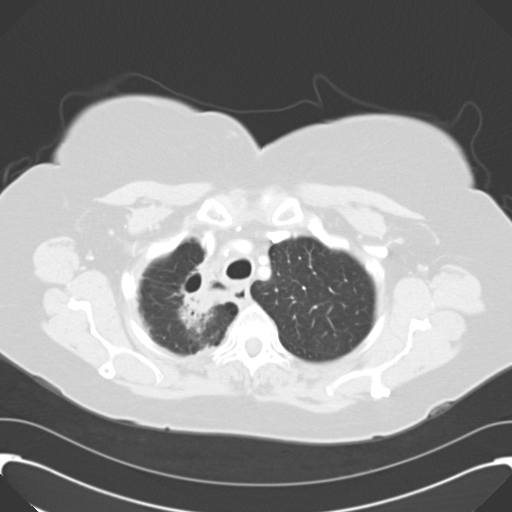
[im 53/58  lung]
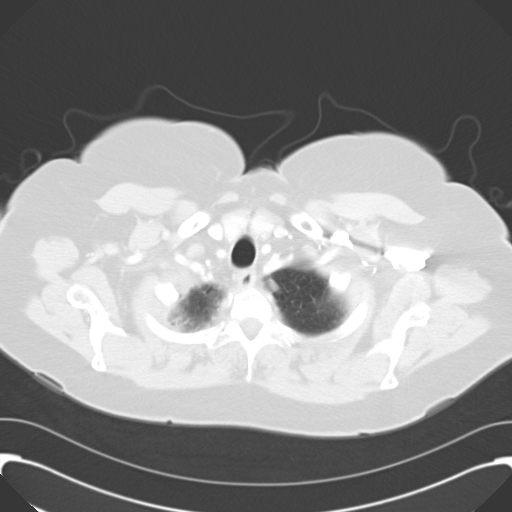

[Series 602: <mpr thick range> · coronal · 0.74mm/px · 3 of 83 slices shown]
[im 17/83  lung]
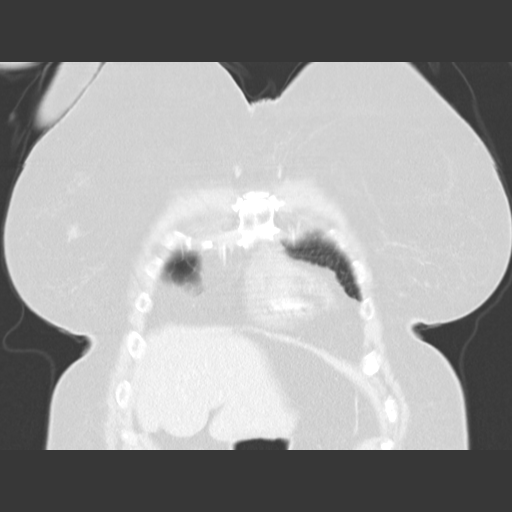
[im 33/83  lung]
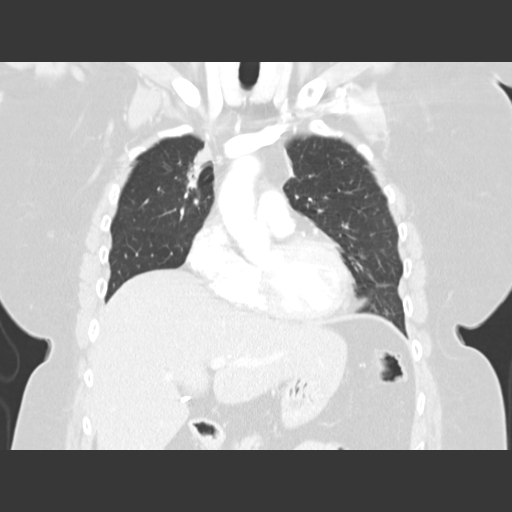
[im 50/83  lung]
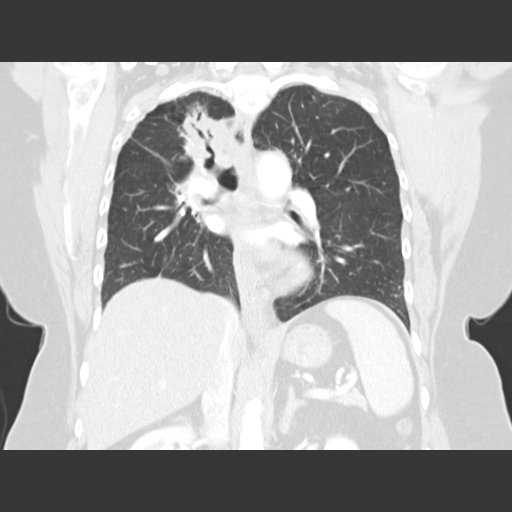

[15 of 36 positions shown; findings below may reference images not displayed]

FINDINGS: There is no axillary lymphadenopathy.  A band of soft
tissue attenuation in the medial right upper lung is stable and
remains compatible with postradiation change.  The abnormal soft
tissue attenuation and effacing the right main stem bronchus and
upper lobe airways is also unchanged.  No left hilar
lymphadenopathy.  Heart size is stable.  Trace pericardial fluid or
thickening is unchanged.  There is no pleural effusion.

Lung windows show no new or progressive disease in the right lung.
There is some probable atelectasis in the base of the lingula.  No
new parenchymal nodule or mass.

Bone windows reveal no worrisome lytic or sclerotic osseous
lesions. No adrenal lesions.  The tiny hypervascular focus in the
anterior liver dome is stable.
IMPRESSION: Stable exam.  No new or progressive disease to suggest recurrent
neoplasm.

## 2013-12-27 ENCOUNTER — Other Ambulatory Visit: Payer: Self-pay | Admitting: Internal Medicine

## 2013-12-27 DIAGNOSIS — Z1231 Encounter for screening mammogram for malignant neoplasm of breast: Secondary | ICD-10-CM

## 2013-12-31 ENCOUNTER — Ambulatory Visit (HOSPITAL_COMMUNITY)
Admission: RE | Admit: 2013-12-31 | Discharge: 2013-12-31 | Disposition: A | Discharge status: Home / Self Care | Payer: Medicare Other | Source: Ambulatory Visit | Attending: Internal Medicine | Admitting: Internal Medicine

## 2013-12-31 DIAGNOSIS — Z1231 Encounter for screening mammogram for malignant neoplasm of breast: Secondary | ICD-10-CM

## 2014-01-10 ENCOUNTER — Ambulatory Visit (HOSPITAL_COMMUNITY)
Admission: RE | Admit: 2014-01-10 | Discharge: 2014-01-10 | Disposition: A | Discharge status: Home / Self Care | Payer: Medicare Other | Source: Ambulatory Visit | Attending: Physician Assistant | Admitting: Physician Assistant

## 2014-01-10 ENCOUNTER — Other Ambulatory Visit (HOSPITAL_COMMUNITY): Payer: Medicare Other

## 2014-01-10 ENCOUNTER — Other Ambulatory Visit (HOSPITAL_BASED_OUTPATIENT_CLINIC_OR_DEPARTMENT_OTHER): Payer: Medicare Other

## 2014-01-10 ENCOUNTER — Encounter (HOSPITAL_COMMUNITY): Payer: Self-pay

## 2014-01-10 DIAGNOSIS — C3490 Malignant neoplasm of unspecified part of unspecified bronchus or lung: Secondary | ICD-10-CM

## 2014-01-10 DIAGNOSIS — T66XXXA Radiation sickness, unspecified, initial encounter: Secondary | ICD-10-CM | POA: Insufficient documentation

## 2014-01-10 DIAGNOSIS — Y842 Radiological procedure and radiotherapy as the cause of abnormal reaction of the patient, or of later complication, without mention of misadventure at the time of the procedure: Secondary | ICD-10-CM | POA: Insufficient documentation

## 2014-01-10 DIAGNOSIS — K7689 Other specified diseases of liver: Secondary | ICD-10-CM | POA: Insufficient documentation

## 2014-01-10 DIAGNOSIS — Z85118 Personal history of other malignant neoplasm of bronchus and lung: Secondary | ICD-10-CM

## 2014-01-10 LAB — CBC WITH DIFFERENTIAL/PLATELET
BASO%: 0.6 % (ref 0.0–2.0)
Basophils Absolute: 0 10*3/uL (ref 0.0–0.1)
EOS%: 3 % (ref 0.0–7.0)
Eosinophils Absolute: 0.2 10*3/uL (ref 0.0–0.5)
HCT: 41.6 % (ref 34.8–46.6)
HGB: 13.9 g/dL (ref 11.6–15.9)
LYMPH%: 23.8 % (ref 14.0–49.7)
MCH: 30.1 pg (ref 25.1–34.0)
MCHC: 33.4 g/dL (ref 31.5–36.0)
MCV: 90.1 fL (ref 79.5–101.0)
MONO#: 0.5 10*3/uL (ref 0.1–0.9)
MONO%: 8.9 % (ref 0.0–14.0)
NEUT%: 63.7 % (ref 38.4–76.8)
NEUTROS ABS: 3.3 10*3/uL (ref 1.5–6.5)
PLATELETS: 185 10*3/uL (ref 145–400)
RBC: 4.62 10*6/uL (ref 3.70–5.45)
RDW: 16.1 % — AB (ref 11.2–14.5)
WBC: 5.2 10*3/uL (ref 3.9–10.3)
lymph#: 1.2 10*3/uL (ref 0.9–3.3)

## 2014-01-10 LAB — COMPREHENSIVE METABOLIC PANEL (CC13)
ALBUMIN: 3.7 g/dL (ref 3.5–5.0)
ALK PHOS: 126 U/L (ref 40–150)
ALT: 16 U/L (ref 0–55)
AST: 14 U/L (ref 5–34)
Anion Gap: 11 mEq/L (ref 3–11)
BUN: 10.7 mg/dL (ref 7.0–26.0)
CO2: 26 mEq/L (ref 22–29)
Calcium: 9.3 mg/dL (ref 8.4–10.4)
Chloride: 107 mEq/L (ref 98–109)
Creatinine: 0.8 mg/dL (ref 0.6–1.1)
Glucose: 189 mg/dl — ABNORMAL HIGH (ref 70–140)
POTASSIUM: 3.9 meq/L (ref 3.5–5.1)
SODIUM: 144 meq/L (ref 136–145)
TOTAL PROTEIN: 7 g/dL (ref 6.4–8.3)
Total Bilirubin: 0.36 mg/dL (ref 0.20–1.20)

## 2014-01-10 MED ORDER — GADOBENATE DIMEGLUMINE 529 MG/ML IV SOLN
20.0000 mL | Freq: Once | INTRAVENOUS | Status: AC | PRN
  Administered 2014-01-10: 16 mL via INTRAVENOUS

## 2014-01-10 MED ORDER — IOHEXOL 300 MG/ML  SOLN
80.0000 mL | Freq: Once | INTRAMUSCULAR | Status: AC | PRN
  Administered 2014-01-10: 80 mL via INTRAVENOUS

## 2014-01-13 ENCOUNTER — Other Ambulatory Visit: Payer: Self-pay | Admitting: Internal Medicine

## 2014-01-14 ENCOUNTER — Ambulatory Visit: Payer: Medicare Other | Admitting: Internal Medicine

## 2014-01-15 ENCOUNTER — Ambulatory Visit: Payer: Medicare Other | Admitting: Internal Medicine

## 2014-01-16 ENCOUNTER — Ambulatory Visit (HOSPITAL_BASED_OUTPATIENT_CLINIC_OR_DEPARTMENT_OTHER): Payer: Medicare Other | Admitting: Internal Medicine

## 2014-01-16 ENCOUNTER — Encounter: Payer: Self-pay | Admitting: Internal Medicine

## 2014-01-16 VITALS — BP 127/68 | HR 92 | Temp 97.8°F | Resp 18 | Ht 62.0 in | Wt 182.4 lb

## 2014-01-16 DIAGNOSIS — Z85118 Personal history of other malignant neoplasm of bronchus and lung: Secondary | ICD-10-CM

## 2014-01-16 DIAGNOSIS — C3490 Malignant neoplasm of unspecified part of unspecified bronchus or lung: Secondary | ICD-10-CM

## 2014-01-16 NOTE — Progress Notes (Signed)
[facility] [phone]   Fax:([phone]) [place], [place] [phone]  PRINCIPAL DIAGNOSIS: Limited stage small cell lung cancer diagnosed in March of 2010.   PRIOR THERAPY:  1. Status post 4 cycles of systemic chemotherapy with carboplatin and etoposide concurrent with radiation during cycle 2 and 3. The last dose of chemotherapy was given April 01, 2009. 2. Status post prophylactic cranial irradiation under the care of Dr. Lisbeth Renshaw completed May 19, 2009.  CURRENT THERAPY: Observation.  INTERVAL HISTORY: [name] 68 y.o. female returns to the clinic today for routine six-month followup visit. The patient is feeling fine today with no specific complaints except for shortness breath with exertion. The patient denied having any significant chest pain, cough or hemoptysis. She had repeat CT scan of the chest as well as MRI of the brain performed recently and she is here for evaluation and discussion of her scan results.  MEDICAL HISTORY: Past Medical History  Diagnosis Date  . Diabetes mellitus   . GERD (gastroesophageal reflux disease)   . Lung cancer     non-small cell lung ca, stage III in 05/2011, s/p chemo/radiation  . COPD (chronic obstructive pulmonary disease)   . History of diverticulitis of colon   . CAD (coronary artery disease)     NON obstructive. Cath 2006 preserved LV fxn, scattered irregularities without critical stenosis. 2008 stress echo negative for ischemia, but with hypertensive response  . Hyperlipidemia   . Cancer of lung 12/24/2008    treated with radiation and chemotherapy    ALLERGIES:  is allergic to lactose intolerance (gi) and ibuprofen.  MEDICATIONS:  Current Outpatient Prescriptions  Medication Sig Dispense Refill  . albuterol (PROVENTIL) (2.5 MG/3ML) 0.083% nebulizer solution Take 3 mLs (2.5 mg total) by nebulization every 4 (four) hours as needed  for wheezing or shortness of breath.  75 mL  6  . aspirin 81 MG tablet Take 81 mg by mouth at bedtime.       . Blood Glucose Monitoring Suppl (ONE TOUCH ULTRA 2) W/DEVICE KIT 1 each by Other route daily.      . fluticasone (FLONASE) 50 MCG/ACT nasal spray Place 2 sprays into both nostrils at bedtime.      Marland Kitchen HUMULIN 70/30 KWIKPEN (70-30) 100 UNIT/ML PEN INJECT 25 UNITS SUBCUTANEOUSLY IN THE MORNING AND 22 UNITS IN THE EVENING WITH MEALS  15 mL  5  . loratadine (CLARITIN) 10 MG tablet Take 10 mg by mouth at bedtime.       . ONE TOUCH ULTRA TEST test strip 1 each by Other route daily.      Glory Rosebush DELICA LANCETS 15Q MISC 1 each by Other route daily.      . pantoprazole (PROTONIX) 40 MG tablet Take 40 mg by mouth at bedtime.      . simvastatin (ZOCOR) 10 MG tablet TAKE ONE TABLET BY MOUTH AT BEDTIME  30 tablet  0  . nitroGLYCERIN (NITROSTAT) 0.4 MG SL tablet Place 1 tablet (0.4 mg total) under the tongue every 5 (five) minutes as needed. For chest pain  50 tablet  3   No current facility-administered medications for this visit.    SURGICAL HISTORY:  Past Surgical History  Procedure Laterality Date  . Appendectomy    . Abdominal hysterectomy  1997  . Carpal tunnel release      REVIEW OF SYSTEMS:  A comprehensive review of systems  was negative.   PHYSICAL EXAMINATION: General appearance: alert, cooperative and no distress Head: Normocephalic, without obvious abnormality, atraumatic Neck: no adenopathy Resp: clear to auscultation bilaterally Cardio: regular rate and rhythm, S1, S2 normal, no murmur, click, rub or gallop GI: soft, non-tender; bowel sounds normal; no masses,  no organomegaly Extremities: extremities normal, atraumatic, no cyanosis or edema  ECOG PERFORMANCE STATUS: 0 - Asymptomatic  Blood pressure 127/68, pulse 92, temperature 97.8 F (36.6 C), temperature source Oral, resp. rate 18, height _0  (1.575 m), weight 182 lb 6.4 oz (82.736 kg), SpO2 96.00%.  LABORATORY  DATA: Lab Results  Component Value Date   WBC 5.2 01/10/2014   HGB 13.9 01/10/2014   HCT 41.6 01/10/2014   MCV 90.1 01/10/2014   PLT 185 01/10/2014      Chemistry      Component Value Date/Time   NA 144 01/10/2014 0820   NA 142 11/15/2013 1242   NA 141 02/16/2012 0803   K 3.9 01/10/2014 0820   K 4.4 11/15/2013 1242   K 3.9 02/16/2012 0803   CL 104 11/15/2013 1242   CL 105 02/18/2013 0812   CL 99 02/16/2012 0803   CO2 26 01/10/2014 0820   CO2 24 11/15/2013 1242   CO2 28 02/16/2012 0803   BUN 10.7 01/10/2014 0820   BUN 13 11/15/2013 1242   BUN 12 02/16/2012 0803   CREATININE 0.8 01/10/2014 0820   CREATININE 0.71 11/15/2013 1242   CREATININE 0.9 02/16/2012 0803      Component Value Date/Time   CALCIUM 9.3 01/10/2014 0820   CALCIUM 8.9 11/15/2013 1242   CALCIUM 8.7 02/16/2012 0803   ALKPHOS 126 01/10/2014 0820   ALKPHOS 107 10/02/2013 1834   ALKPHOS 75 02/16/2012 0803   AST 14 01/10/2014 0820   AST 18 10/02/2013 1834   AST 20 02/16/2012 0803   ALT 16 01/10/2014 0820   ALT 17 10/02/2013 1834   ALT 22 02/16/2012 0803   BILITOT 0.36 01/10/2014 0820   BILITOT 0.2* 10/02/2013 1834   BILITOT 0.70 02/16/2012 0803       RADIOGRAPHIC STUDIES: Ct Chest W Contrast  01/10/2014   CLINICAL DATA:  Restaging lung cancer.  EXAM: CT CHEST WITH CONTRAST  TECHNIQUE: Multidetector CT imaging of the chest was performed during intravenous contrast administration.  CONTRAST:  58m OMNIPAQUE IOHEXOL 300 MG/ML  SOLN  COMPARISON:  None.  FINDINGS: There is no pleural effusion identified. Paramediastinal radiation changes identified involving the right upper lobe there is a 4 mm nodule within the right lower lobe, image 28/series 5. This is unchanged from previous exam. No new or enlarging pulmonary nodules or mass is identified. The heart size is normal. There is no pericardial effusion. No enlarged mediastinal or hilar lymph nodes. No axillary or supraclavicular adenopathy. Similar appearance of low-attenuation, sub cm  nodules within the thyroid gland.  Incidental imaging through the upper abdomen shows mild diffuse low attenuation throughout the liver parenchyma. Prior coli cystectomy. No biliary dilatation. The spleen is unremarkable. Normal appearance of both adrenal glands.  Review of the visualized bony structures is on unremarkable. There is no aggressive lytic or sclerotic bone lesions identified.  IMPRESSION: 1. No acute findings. No specific features identified to suggest residual or recurrence of tumor. 2. Stable radiation change within the right lung. 3. Hepatic steatosis.   Electronically Signed   By: TKerby MoorsM.D.   On: 01/10/2014 09:55   Mr BJeri CosWJJContrast  01/10/2014   CLINICAL DATA:  Restaging small cell lung cancer.  EXAM: MRI HEAD WITHOUT AND WITH CONTRAST  TECHNIQUE: Multiplanar, multiecho pulse sequences of the brain and surrounding structures were obtained without and with intravenous contrast.  CONTRAST:  63m MULTIHANCE GADOBENATE DIMEGLUMINE 529 MG/ML IV SOLN  COMPARISON:  MRI 07/14/2010  FINDINGS: Generalized atrophy is similar to the prior study. Negative for hydrocephalus.  Patchy hyperintensity throughout the cerebral white matter has progressed since 2011. This is most likely due to chemo/ radiation. Brainstem and cerebellum have normal signal.  Negative for acute infarct.  Negative for hemorrhage.  Postcontrast imaging reveals normal enhancement. No enhancing mass lesion. Leptomeningeal enhancement is normal.  No edema or shift to the midline structures.  Paranasal sinuses are clear aside from minimal mucosal thickening in the left maxillary sinus.  IMPRESSION: Negative for metastatic disease.  Progression of white matter disease since 2011 likely related to chronic microvascular ischemia or chemo/radiation.   Electronically Signed   By: CFranchot GalloM.D.   On: 01/10/2014 11:44   Mm Digital Screening Bilateral  12/31/2013   CLINICAL DATA Screening.  EXAM DIGITAL SCREENING BILATERAL  MAMMOGRAM WITH CAD  COMPARISON None. Prior mammograms from 2004 are no longer available for comparison.  ACR BREAST DENSITY ACR Breast Density Category b: There are scattered areas of fibroglandular density.  FINDINGS There are no findings suspicious for malignancy. Images were processed with CAD.  IMPRESSION No mammographic evidence of malignancy. A result letter of this screening mammogram will be mailed directly to the patient.  RECOMMENDATION Screening mammogram in one year. (Code:SM-B-01Y)  BI-RADS CATEGORY 1: Negative.  SIGNATURE  Electronically Signed   By: TEvangeline DakinM.D.   On: 12/31/2013 17:30   ASSESSMENT AND PLAN: This is a very pleasant 68years old white female with history of limited stage small cell lung cancer status post systemic chemotherapy concurrent with radiation followed by prophylactic cranial irradiation and has been observation since July of 2010 with no evidence for disease recurrence. I discussed the scan results with the patient and her husband. I recommended for her to continue on observation with repeat CT scan of the chest in 1 year.  She was advised to call immediately if she has any concerning symptoms in the interval.  All questions were answered. The patient knows to call the clinic with any problems, questions or concerns. We can certainly see the patient much sooner if necessary.  Disclaimer: This note was dictated with voice recognition software. Similar sounding words can inadvertently be transcribed and may not be corrected upon review.

## 2014-01-17 ENCOUNTER — Telehealth: Payer: Self-pay | Admitting: Internal Medicine

## 2014-01-17 NOTE — Telephone Encounter (Signed)
s.w. pt and advised on March 2016 appt....pt ok adn aware

## 2014-02-09 NOTE — Assessment & Plan Note (Signed)
Check labs today Continue current dose insulin for now

## 2014-02-09 NOTE — Assessment & Plan Note (Addendum)
  Check labs today and increase simvastatin to 40 mg po qd if LDL is not less than 100

## 2014-02-09 NOTE — Progress Notes (Signed)
Small cell lung ca- see dr. Worthy Flank note  DM- no home  cbgs- needs labs Lab Results  Component Value Date   HGBA1C 7.8* 07/18/2013    Lipids- tolerating meds Lipid Panel     Component Value Date/Time   CHOL 177 07/18/2013 0842   TRIG 144.0 07/18/2013 0842   HDL 34.90* 07/18/2013 0842   CHOLHDL 5 07/18/2013 0842   VLDL 28.8 07/18/2013 0842   LDLCALC 113* 07/18/2013 0842  needs labs  Past Medical History  Diagnosis Date  . Diabetes mellitus   . GERD (gastroesophageal reflux disease)   . Lung cancer     non-small cell lung ca, stage III in 05/2011, s/p chemo/radiation  . COPD (chronic obstructive pulmonary disease)   . History of diverticulitis of colon   . CAD (coronary artery disease)     NON obstructive. Cath 2006 preserved LV fxn, scattered irregularities without critical stenosis. 2008 stress echo negative for ischemia, but with hypertensive response  . Hyperlipidemia   . Cancer of lung 12/24/2008    treated with radiation and chemotherapy    History   Social History  . Marital Status: Married    Spouse Name: N/A    Number of Children: N/A  . Years of Education: N/A   Occupational History  . Retired Systems analyst    Social History Main Topics  . Smoking status: Former Smoker -- 1.00 packs/day for 45 years    Types: Cigarettes    Quit date: 10/24/2008  . Smokeless tobacco: Not on file  . Alcohol Use: No  . Drug Use: No  . Sexual Activity: Not on file   Other Topics Concern  . Not on file   Social History Narrative   Lives in [place], has husband and daughter and son. Daughter's family is living with her and husband at present. She is ambulatory daily without cane or walker.     Past Surgical History  Procedure Laterality Date  . Appendectomy    . Abdominal hysterectomy  1997  . Carpal tunnel release      Family History  Problem Relation Age of Onset  . Heart failure Mother   . Kidney disease Mother     renal failure  . Diabetes Mother   .  Diabetes Father   . Diabetes Brother   . Heart disease Brother   . Heart attack Brother   . Heart attack Brother   . Heart attack Brother     Allergies  Allergen Reactions  . Lactose Intolerance (Gi) Diarrhea  . Ibuprofen     REACTION: Anxiousness, hypoventilate     Current Outpatient Prescriptions on File Prior to Visit  Medication Sig Dispense Refill  . albuterol (PROVENTIL) (2.5 MG/3ML) 0.083% nebulizer solution Take 3 mLs (2.5 mg total) by nebulization every 4 (four) hours as needed for wheezing or shortness of breath.  75 mL  6  . aspirin 81 MG tablet Take 81 mg by mouth at bedtime.       . Blood Glucose Monitoring Suppl (ONE TOUCH ULTRA 2) W/DEVICE KIT 1 each by Other route daily.      . fluticasone (FLONASE) 50 MCG/ACT nasal spray Place 2 sprays into both nostrils at bedtime.      Marland Kitchen HUMULIN 70/30 KWIKPEN (70-30) 100 UNIT/ML PEN INJECT 25 UNITS SUBCUTANEOUSLY IN THE MORNING AND 22 UNITS IN THE EVENING WITH MEALS  15 mL  5  . loratadine (CLARITIN) 10 MG tablet Take 10 mg by mouth at bedtime.       Marland Kitchen  nitroGLYCERIN (NITROSTAT) 0.4 MG SL tablet Place 1 tablet (0.4 mg total) under the tongue every 5 (five) minutes as needed. For chest pain  50 tablet  3  . ONE TOUCH ULTRA TEST test strip 1 each by Other route daily.      Glory Rosebush DELICA LANCETS 82P MISC 1 each by Other route daily.      . pantoprazole (PROTONIX) 40 MG tablet Take 40 mg by mouth at bedtime.      . simvastatin (ZOCOR) 10 MG tablet TAKE ONE TABLET BY MOUTH AT BEDTIME  30 tablet  0   No current facility-administered medications on file prior to visit.     patient denies chest pain, shortness of breath, orthopnea. Denies lower extremity edema, abdominal pain, change in appetite, change in bowel movements. Patient denies rashes, musculoskeletal complaints. No other specific complaints in a complete review of systems.   Reviewed vitals  Well-developed well-nourished female in no acute distress. HEENT exam atraumatic,  normocephalic, extraocular muscles are intact. Neck is supple. No jugular venous distention no thyromegaly. Chest clear to auscultation without increased work of breathing. Cardiac exam S1 and S2 are regular. Abdominal exam active bowel sounds, soft, nontender. Extremities no edema. Neurologic exam she is alert without any motor sensory deficits. Gait is normal.   DM (diabetes mellitus), type 2, uncontrolled with complications Check labs today Continue current dose insulin for now  HYPERLIPIDEMIA Not controlled Check labs today and increase simvastatin to 40 mg po qd  CARCINOMA, LUNG, SMALL CELL Reviewed Dr. Worthy Flank note. No evidence of disease. She has yearly followup with oncology.

## 2014-02-10 ENCOUNTER — Ambulatory Visit (INDEPENDENT_AMBULATORY_CARE_PROVIDER_SITE_OTHER): Payer: Medicare Other | Admitting: Internal Medicine

## 2014-02-10 ENCOUNTER — Encounter: Payer: Self-pay | Admitting: Internal Medicine

## 2014-02-10 VITALS — BP 112/74 | HR 64 | Temp 97.6°F | Ht 62.0 in | Wt 183.0 lb

## 2014-02-10 DIAGNOSIS — E118 Type 2 diabetes mellitus with unspecified complications: Principal | ICD-10-CM

## 2014-02-10 DIAGNOSIS — E1165 Type 2 diabetes mellitus with hyperglycemia: Secondary | ICD-10-CM

## 2014-02-10 DIAGNOSIS — C3490 Malignant neoplasm of unspecified part of unspecified bronchus or lung: Secondary | ICD-10-CM

## 2014-02-10 DIAGNOSIS — IMO0002 Reserved for concepts with insufficient information to code with codable children: Secondary | ICD-10-CM

## 2014-02-10 DIAGNOSIS — E785 Hyperlipidemia, unspecified: Secondary | ICD-10-CM

## 2014-02-10 LAB — HEPATIC FUNCTION PANEL
ALBUMIN: 3.6 g/dL (ref 3.5–5.2)
ALT: 19 U/L (ref 0–35)
AST: 20 U/L (ref 0–37)
Alkaline Phosphatase: 101 U/L (ref 39–117)
BILIRUBIN DIRECT: 0 mg/dL (ref 0.0–0.3)
Total Bilirubin: 0.6 mg/dL (ref 0.3–1.2)
Total Protein: 6.8 g/dL (ref 6.0–8.3)

## 2014-02-10 LAB — BASIC METABOLIC PANEL
BUN: 8 mg/dL (ref 6–23)
CHLORIDE: 105 meq/L (ref 96–112)
CO2: 26 meq/L (ref 19–32)
CREATININE: 0.7 mg/dL (ref 0.4–1.2)
Calcium: 8.9 mg/dL (ref 8.4–10.5)
GFR: 84.87 mL/min (ref >60.00)
GLUCOSE: 158 mg/dL — AB (ref 70–99)
Potassium: 3.6 mEq/L (ref 3.5–5.1)
Sodium: 139 mEq/L (ref 135–145)

## 2014-02-10 LAB — LIPID PANEL
CHOL/HDL RATIO: 6
Cholesterol: 154 mg/dL (ref 0–200)
HDL: 27 mg/dL — ABNORMAL LOW (ref >39.00)
LDL Cholesterol: 78 mg/dL (ref 0–99)
TRIGLYCERIDES: 246 mg/dL — AB (ref 0.0–149.0)
VLDL: 49.2 mg/dL — ABNORMAL HIGH (ref 0.0–40.0)

## 2014-02-10 LAB — MICROALBUMIN / CREATININE URINE RATIO
Creatinine,U: 83.1 mg/dL
MICROALB/CREAT RATIO: 0.8 mg/g (ref 0.0–30.0)
Microalb, Ur: 0.7 mg/dL (ref 0.0–1.9)

## 2014-02-10 LAB — HEMOGLOBIN A1C: HEMOGLOBIN A1C: 7.7 % — AB (ref 4.6–6.5)

## 2014-02-10 MED ORDER — SIMVASTATIN 40 MG PO TABS: 40.0000 mg | ORAL_TABLET | Freq: Every day | ORAL | Status: DC

## 2014-02-10 NOTE — Progress Notes (Signed)
Pre visit review using our clinic review tool, if applicable. No additional management support is needed unless otherwise documented below in the visit note. 

## 2014-02-11 NOTE — Assessment & Plan Note (Signed)
Reviewed Dr. Worthy Flank note. No evidence of disease. She has yearly followup with oncology.

## 2014-02-17 ENCOUNTER — Telehealth: Payer: Self-pay | Admitting: Internal Medicine

## 2014-02-17 NOTE — Telephone Encounter (Signed)
Pt would like to switch to dr fry due to dr  Leanne Chang leaving. Pt daughter Nolberto Hanlon sees dr fry

## 2014-02-17 NOTE — Telephone Encounter (Signed)
Sorry but I am too full

## 2014-02-18 NOTE — Telephone Encounter (Signed)
Pt will go to [facility]

## 2014-02-18 NOTE — Telephone Encounter (Signed)
Per Dr Leanne Chang he can switch to Dr Yong Channel

## 2014-02-21 ENCOUNTER — Telehealth: Payer: Self-pay

## 2014-02-21 NOTE — Telephone Encounter (Signed)
Relevant patient education mailed to patient.  

## 2014-02-24 ENCOUNTER — Other Ambulatory Visit: Payer: Self-pay | Admitting: Family Medicine

## 2014-04-11 ENCOUNTER — Other Ambulatory Visit: Payer: Self-pay | Admitting: Internal Medicine

## 2014-04-15 ENCOUNTER — Encounter: Payer: Self-pay | Admitting: Family Medicine

## 2014-04-15 ENCOUNTER — Ambulatory Visit (INDEPENDENT_AMBULATORY_CARE_PROVIDER_SITE_OTHER): Payer: Medicare Other | Admitting: Family Medicine

## 2014-04-15 VITALS — BP 121/76 | HR 84 | Temp 98.2°F | Resp 22 | Ht 62.0 in | Wt 181.0 lb

## 2014-04-15 DIAGNOSIS — E118 Type 2 diabetes mellitus with unspecified complications: Principal | ICD-10-CM

## 2014-04-15 DIAGNOSIS — IMO0002 Reserved for concepts with insufficient information to code with codable children: Secondary | ICD-10-CM

## 2014-04-15 DIAGNOSIS — E1165 Type 2 diabetes mellitus with hyperglycemia: Secondary | ICD-10-CM

## 2014-04-15 MED ORDER — PANTOPRAZOLE SODIUM 40 MG PO TBEC: DELAYED_RELEASE_TABLET | ORAL | Status: DC

## 2014-04-15 MED ORDER — INSULIN ISOPHANE & REGULAR (HUMAN 70-30)100 UNIT/ML KWIKPEN: PEN_INJECTOR | SUBCUTANEOUS | Status: DC

## 2014-04-15 NOTE — Progress Notes (Signed)
Pre visit review using our clinic review tool, if applicable. No additional management support is needed unless otherwise documented below in the visit note. 

## 2014-04-15 NOTE — Progress Notes (Signed)
Office Note 04/20/2014  CC:  Chief Complaint  Patient presents with  . Diabetes    HPI:  [name] is a 68 y.o. White female who is here to establish care and discuss sugars/DM 2 f/u. Patient's most recent primary MD: Dr. Leanne Chang. Old records in EPIC/HL were reviewed prior to or during today's visit.  Reviewed home glucoses: fasting avg 150s, pre-supper avg 200 or more.  Occ has a number around 100. No hypoglycemic episodes. She does not follow a diabetic diet at all. Exercise is limited to some walking when watching her grand kids.  Past Medical History  Diagnosis Date  . Diabetes mellitus   . GERD (gastroesophageal reflux disease)   . Lung cancer     non-small cell lung ca, stage III in 05/2011, s/p chemo/radiation  . COPD (chronic obstructive pulmonary disease)   . History of diverticulitis of colon   . CAD (coronary artery disease)     NON obstructive. Cath 2006 preserved LV fxn, scattered irregularities without critical stenosis. 2008 stress echo negative for ischemia, but with hypertensive response  . Hyperlipidemia   . Cancer of lung 12/24/2008    treated with radiation and chemotherapy    Past Surgical History  Procedure Laterality Date  . Appendectomy    . Abdominal hysterectomy  1997  . Carpal tunnel release      Family History  Problem Relation Age of Onset  . Heart failure Mother   . Kidney disease Mother     renal failure  . Diabetes Mother   . Diabetes Father   . Diabetes Brother   . Heart disease Brother   . Heart attack Brother   . Heart attack Brother   . Heart attack Brother     History   Social History  . Marital Status: Married    Spouse Name: N/A    Number of Children: N/A  . Years of Education: N/A   Occupational History  . Retired Systems analyst    Social History Main Topics  . Smoking status: Former Smoker -- 1.00 packs/day for 45 years    Types: Cigarettes    Quit date: 10/24/2008  . Smokeless tobacco: Not on file   . Alcohol Use: No  . Drug Use: No  . Sexual Activity: Not on file   Other Topics Concern  . Not on file   Social History Narrative   Lives in [place], has husband and daughter and son. Daughter's family is living with her and husband at present. She is ambulatory daily without cane or walker.     Outpatient Encounter Prescriptions as of 04/15/2014  Medication Sig  . albuterol (PROVENTIL) (2.5 MG/3ML) 0.083% nebulizer solution Take 3 mLs (2.5 mg total) by nebulization every 4 (four) hours as needed for wheezing or shortness of breath.  Marland Kitchen aspirin 81 MG tablet Take 81 mg by mouth at bedtime.   . fluticasone (FLONASE) 50 MCG/ACT nasal spray Place 2 sprays into both nostrils at bedtime.  . Insulin Isophane & Regular Human (HUMULIN 70/30 KWIKPEN) (70-30) 100 UNIT/ML PEN 30 U SQ every morning before breakfast and 25 U every evening before supper  . loratadine (CLARITIN) 10 MG tablet Take 10 mg by mouth at bedtime.   . pantoprazole (PROTONIX) 40 MG tablet TAKE ONE TABLET BY MOUTH ONCE DAILY  . simvastatin (ZOCOR) 40 MG tablet Take 1 tablet (40 mg total) by mouth daily.  . [DISCONTINUED] HUMULIN 70/30 KWIKPEN (70-30) 100 UNIT/ML PEN INJECT 25 UNITS SUBCUTANEOUSLY IN THE  MORNING AND 22 UNITS IN THE EVENING WITH MEALS  . [DISCONTINUED] pantoprazole (PROTONIX) 40 MG tablet TAKE ONE TABLET BY MOUTH ONCE DAILY  . Blood Glucose Monitoring Suppl (ONE TOUCH ULTRA 2) W/DEVICE KIT 1 each by Other route daily.  . nitroGLYCERIN (NITROSTAT) 0.4 MG SL tablet Place 1 tablet (0.4 mg total) under the tongue every 5 (five) minutes as needed. For chest pain  . ONE TOUCH ULTRA TEST test strip 1 each by Other route daily.  Glory Rosebush DELICA LANCETS 20U MISC 1 each by Other route daily.    Allergies  Allergen Reactions  . Lactose Intolerance (Gi) Diarrhea  . Ibuprofen     REACTION: Anxiousness, hypoventilate     PE; Blood pressure 121/76, pulse 84, temperature 98.2 F (36.8 C), temperature source Temporal,  resp. rate 22, height 5' 2" (1.575 m), weight 181 lb (82.101 kg), SpO2 95.00%. Gen: Alert, well appearing.  Patient is oriented to person, place, time, and situation. AFFECT: pleasant, lucid thought and speech. No further exam today. Pertinent labs:  None today  ASSESSMENT AND PLAN:   DM (diabetes mellitus), type 2, uncontrolled with complications Worse control lately, dietary noncompliance.  Last HbA1c about 2 mo ago was 7.7%. Increase 70/30 insulin to 30 U qAM and 25 U qsupper.  Continue bid glucose monitoring.  An After Visit Summary was printed and given to the patient.  Return in about 1 month (around 05/15/2014) for f/u diabetes.

## 2014-04-20 ENCOUNTER — Encounter: Payer: Self-pay | Admitting: Family Medicine

## 2014-04-20 NOTE — Assessment & Plan Note (Signed)
Worse control lately, dietary noncompliance.  Last HbA1c about 2 mo ago was 7.7%. Increase 70/30 insulin to 30 U qAM and 25 U qsupper.  Continue bid glucose monitoring.

## 2014-05-13 ENCOUNTER — Encounter: Payer: Self-pay | Admitting: Family Medicine

## 2014-05-13 ENCOUNTER — Ambulatory Visit (INDEPENDENT_AMBULATORY_CARE_PROVIDER_SITE_OTHER): Payer: Medicare Other | Admitting: Family Medicine

## 2014-05-13 VITALS — BP 117/74 | HR 95 | Temp 97.7°F | Resp 18 | Ht 62.0 in | Wt 179.0 lb

## 2014-05-13 DIAGNOSIS — IMO0001 Reserved for inherently not codable concepts without codable children: Secondary | ICD-10-CM

## 2014-05-13 DIAGNOSIS — E1165 Type 2 diabetes mellitus with hyperglycemia: Secondary | ICD-10-CM

## 2014-05-13 DIAGNOSIS — E118 Type 2 diabetes mellitus with unspecified complications: Secondary | ICD-10-CM

## 2014-05-13 DIAGNOSIS — Z23 Encounter for immunization: Secondary | ICD-10-CM

## 2014-05-13 DIAGNOSIS — IMO0002 Reserved for concepts with insufficient information to code with codable children: Secondary | ICD-10-CM

## 2014-05-13 LAB — HEMOGLOBIN A1C: HEMOGLOBIN A1C: 8.5 % — AB (ref 4.6–6.5)

## 2014-05-13 MED ORDER — PANTOPRAZOLE SODIUM 40 MG PO TBEC: DELAYED_RELEASE_TABLET | ORAL | Status: DC

## 2014-05-13 MED ORDER — INSULIN ISOPHANE & REGULAR (HUMAN 70-30)100 UNIT/ML KWIKPEN: PEN_INJECTOR | SUBCUTANEOUS | Status: DC

## 2014-05-13 NOTE — Progress Notes (Signed)
Pre visit review using our clinic review tool, if applicable. No additional management support is needed unless otherwise documented below in the visit note. 

## 2014-05-13 NOTE — Addendum Note (Signed)
Addended by: Ralph Dowdy on: 05/13/2014 10:05 AM   Modules accepted: Orders

## 2014-05-13 NOTE — Progress Notes (Signed)
OFFICE VISIT  05/13/2014   CC:  Chief Complaint  Patient presents with  . Follow-up  . Hyperglycemia   HPI:    Patient is a 68 y.o. Caucasian female who presents for 1 mo f/u poorly controlled DM 2. We increased her 70/30 dosing to 30 U qAM and 25 U qPM last visit. Reviewed glucose log: avg fasting 160s or so, no hypoglycemia. Avg PP 180-200, no hypoglycemia.  Numbers are erratic.     Lab Results  Component Value Date   HGBA1C 7.7* 02/10/2014     Past Medical History  Diagnosis Date  . Diabetes mellitus   . GERD (gastroesophageal reflux disease)   . Lung cancer     non-small cell lung ca, stage III in 05/2011, s/p chemo/radiation  . COPD (chronic obstructive pulmonary disease)   . History of diverticulitis of colon   . CAD (coronary artery disease)     NON obstructive. Cath 2006 preserved LV fxn, scattered irregularities without critical stenosis. 2008 stress echo negative for ischemia, but with hypertensive response  . Hyperlipidemia   . Cancer of lung 12/24/2008    treated with radiation and chemotherapy  . 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 Appendectomy    . Abdominal hysterectomy  1997  . Carpal tunnel release      Outpatient Prescriptions Prior to Visit  Medication Sig Dispense Refill  . albuterol (PROVENTIL) (2.5 MG/3ML) 0.083% nebulizer solution Take 3 mLs (2.5 mg total) by nebulization every 4 (four) hours as needed for wheezing or shortness of breath.  75 mL  6  . aspirin 81 MG tablet Take 81 mg by mouth at bedtime.       . Blood Glucose Monitoring Suppl (ONE TOUCH ULTRA 2) W/DEVICE KIT 1 each by Other route daily.      . fluticasone (FLONASE) 50 MCG/ACT nasal spray Place 2 sprays into both nostrils at bedtime.      Marland Kitchen loratadine (CLARITIN) 10 MG tablet Take 10 mg by mouth at bedtime.       . nitroGLYCERIN (NITROSTAT) 0.4 MG SL tablet Place 1 tablet (0.4 mg  total) under the tongue every 5 (five) minutes as needed. For chest pain  50 tablet  3  . ONE TOUCH ULTRA TEST test strip 1 each by Other route daily.      Glory Rosebush DELICA LANCETS 97Q MISC 1 each by Other route daily.      . simvastatin (ZOCOR) 40 MG tablet Take 1 tablet (40 mg total) by mouth daily.  90 tablet  3  . Insulin Isophane & Regular Human (HUMULIN 70/30 KWIKPEN) (70-30) 100 UNIT/ML PEN 30 U SQ every morning before breakfast and 25 U every evening before supper  15 mL  5  . pantoprazole (PROTONIX) 40 MG tablet TAKE ONE TABLET BY MOUTH ONCE DAILY  30 tablet  11   No facility-administered medications prior to visit.    Allergies  Allergen Reactions  . Lactose Intolerance (Gi) Diarrhea  . Ibuprofen     REACTION: Anxiousness, hypoventilate     ROS As per HPI  PE: Blood pressure 117/74, pulse 95, temperature 97.7 F (36.5 C), temperature source Temporal, resp. rate 18, height 5' 2" (1.575 m), weight 179 lb (81.194 kg), SpO2 91.00%. Gen: Alert, well appearing.  Patient is oriented to person, place, time, and situation. AFFECT: pleasant, lucid thought and speech. No further  exam today.  LABS:  None today  IMPRESSION AND PLAN:  DM (diabetes mellitus), type 2, uncontrolled with complications Control not much changed with increase in insulin last visit. Titrate up to 34 U of 70/30 qAM and 29 U qPM. We reviewed the basic tenants of diabetic diet again today. Check HbA1c today.   An After Visit Summary was printed and given to the patient.  FOLLOW UP: Return in about 4 weeks (around 06/10/2014) for f/u DM 2.

## 2014-05-13 NOTE — Assessment & Plan Note (Signed)
Control not much changed with increase in insulin last visit. Titrate up to 34 U of 70/30 qAM and 29 U qPM. We reviewed the basic tenants of diabetic diet again today. Check HbA1c today.

## 2014-05-15 MED ORDER — METFORMIN HCL 500 MG PO TABS: 500.0000 mg | ORAL_TABLET | Freq: Two times a day (BID) | ORAL | Status: DC

## 2014-05-15 NOTE — Addendum Note (Signed)
Addended by: Jannette Spanner on: 05/15/2014 11:35 AM   Modules accepted: Orders

## 2014-05-16 ENCOUNTER — Other Ambulatory Visit: Payer: Self-pay | Admitting: Internal Medicine

## 2014-06-09 ENCOUNTER — Ambulatory Visit (INDEPENDENT_AMBULATORY_CARE_PROVIDER_SITE_OTHER): Payer: Medicare Other | Admitting: Family Medicine

## 2014-06-09 ENCOUNTER — Encounter: Payer: Self-pay | Admitting: Family Medicine

## 2014-06-09 VITALS — BP 115/77 | HR 84 | Temp 97.6°F | Resp 22 | Ht 62.0 in | Wt 179.0 lb

## 2014-06-09 DIAGNOSIS — E118 Type 2 diabetes mellitus with unspecified complications: Principal | ICD-10-CM

## 2014-06-09 DIAGNOSIS — IMO0002 Reserved for concepts with insufficient information to code with codable children: Secondary | ICD-10-CM

## 2014-06-09 DIAGNOSIS — E1165 Type 2 diabetes mellitus with hyperglycemia: Secondary | ICD-10-CM

## 2014-06-09 NOTE — Progress Notes (Signed)
OFFICE NOTE  06/09/2014  CC:  Chief Complaint  Patient presents with  . Follow-up    fasting   HPI: Patient is a 68 y.o. Caucasian female who is here for 1 mo f/u DM 2.   Glucoses much improved over the last mo: avg fasting around 125, avg 2H PP 150. Feeling good.  A few dietary indescretions made a sugar or two about 200.   Pertinent PMH:  Past Medical History  Diagnosis Date  . Diabetes mellitus   . GERD (gastroesophageal reflux disease)   . Lung cancer     non-small cell lung ca, stage III in 05/2011, s/p chemo/radiation  . COPD (chronic obstructive pulmonary disease)   . History of diverticulitis of colon   . CAD (coronary artery disease)     NON obstructive. Cath 2006 preserved LV fxn, scattered irregularities without critical stenosis. 2008 stress echo negative for ischemia, but with hypertensive response  . Hyperlipidemia   . Cancer of lung 12/24/2008    treated with radiation and chemotherapy  . 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  MEDS:  Outpatient Prescriptions Prior to Visit  Medication Sig Dispense Refill  . albuterol (PROVENTIL) (2.5 MG/3ML) 0.083% nebulizer solution Take 3 mLs (2.5 mg total) by nebulization every 4 (four) hours as needed for wheezing or shortness of breath.  75 mL  6  . aspirin 81 MG tablet Take 81 mg by mouth at bedtime.       . fluticasone (FLONASE) 50 MCG/ACT nasal spray Place 2 sprays into both nostrils at bedtime.      . Insulin Isophane & Regular Human (HUMULIN 70/30 KWIKPEN) (70-30) 100 UNIT/ML PEN 34 U SQ every morning before breakfast and 29 U every evening before supper  15 mL  5  . metFORMIN (GLUCOPHAGE) 500 MG tablet Take 1 tablet (500 mg total) by mouth 2 (two) times daily with a meal.  60 tablet  6  . nitroGLYCERIN (NITROSTAT) 0.4 MG SL tablet Place 1 tablet (0.4 mg total) under the tongue every 5 (five) minutes as needed. For chest pain  50 tablet  3  . pantoprazole  (PROTONIX) 40 MG tablet TAKE ONE TABLET BY MOUTH ONCE DAILY  30 tablet  11  . simvastatin (ZOCOR) 40 MG tablet Take 1 tablet (40 mg total) by mouth daily.  90 tablet  3  . Blood Glucose Monitoring Suppl (ONE TOUCH ULTRA 2) W/DEVICE KIT 1 each by Other route daily.      Marland Kitchen loratadine (CLARITIN) 10 MG tablet Take 10 mg by mouth at bedtime.       . ONE TOUCH ULTRA TEST test strip 1 each by Other route daily.      Glory Rosebush DELICA LANCETS 10G MISC 1 each by Other route daily.      . Insulin Isophane & Regular Human (HUMULIN 70/30 KWIKPEN) (70-30) 100 UNIT/ML PEN 34 U SQ before breakfast and 29 U SQ before Supper  15 mL  11   No facility-administered medications prior to visit.    PE: Blood pressure 115/77, pulse 84, temperature 97.6 F (36.4 C), temperature source Temporal, resp. rate 22, height 5' 2"  (1.575 m), weight 179 lb (81.194 kg), SpO2 96.00%. Gen: Alert, well appearing.  Patient is oriented to person, place, time, and situation. AFFECT: pleasant, lucid thought and speech. No further exam today.  LAB: none today Lab Results  Component Value Date   HGBA1C 8.5* 05/13/2014  IMPRESSION AND PLAN:  DM 2, insulin requiring. Control is improving quite a bit since titrating insulin up and adding metformin. Continue current dosing. F/u DM in 9-10 wks--recheck a1c at that time.  An After Visit Summary was printed and given to the patient.

## 2014-06-09 NOTE — Progress Notes (Signed)
Pre visit review using our clinic review tool, if applicable. No additional management support is needed unless otherwise documented below in the visit note. 

## 2014-06-16 ENCOUNTER — Telehealth: Payer: Self-pay | Admitting: Family Medicine

## 2014-06-16 MED ORDER — INSULIN ISOPHANE & REGULAR (HUMAN 70-30)100 UNIT/ML KWIKPEN: PEN_INJECTOR | SUBCUTANEOUS | Status: DC

## 2014-06-16 NOTE — Telephone Encounter (Signed)
Patient is out of insulin pens since her dosage was increased. Please send in new Rx to [facility].

## 2014-06-16 NOTE — Telephone Encounter (Signed)
Rx sent to [facility]

## 2014-06-22 IMAGING — CT CT CHEST W/ CM
2 of 3 series · 15 of 36 positions shown, 18 images · IV contrast (OMNIPAQUE)
Comparison: 02/18/2013 and 08/17/2012.

CLINICAL DATA: Lung cancer followup. Chemotherapy complete.

EXAM:
CT CHEST WITH CONTRAST
TECHNIQUE: Multidetector CT imaging of the chest was performed during
intravenous contrast administration.
CONTRAST:  80mL OMNIPAQUE IOHEXOL 300 MG/ML  SOLN

[Series 2: chest with st · axial · 0.63mm/px · z∈[-306,-50]mm · 12 of 61 slices shown, 15 images]
[im 5/61  mediastinal]
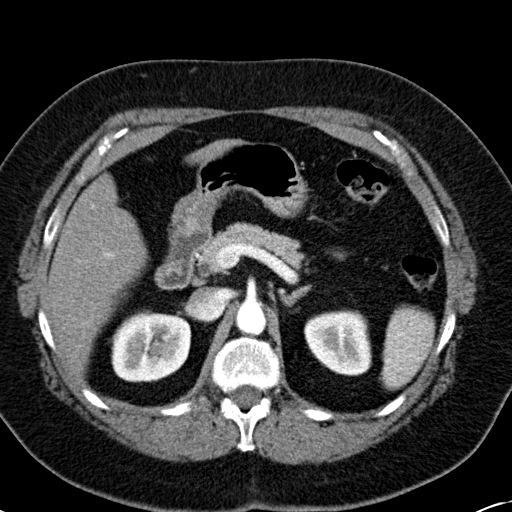
[im 5/61  lung]
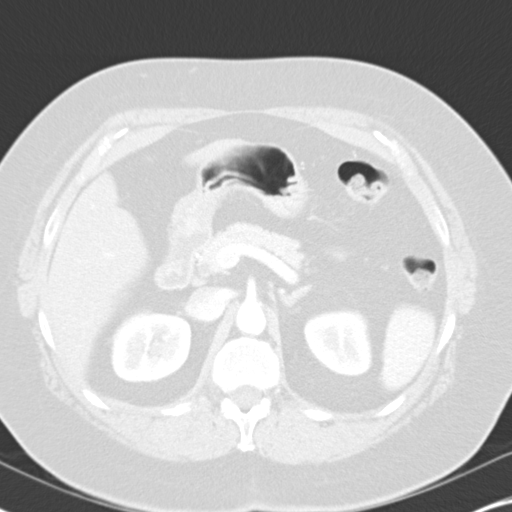
[im 9/61  lung]
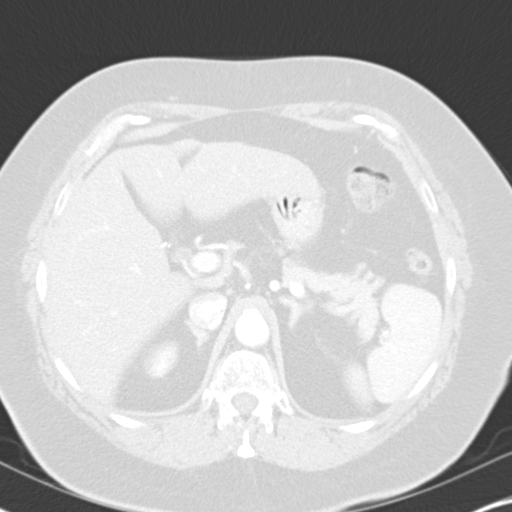
[im 14/61  lung]
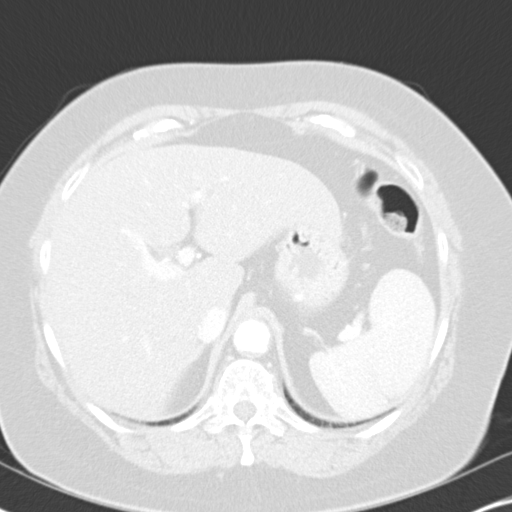
[im 18/61  lung]
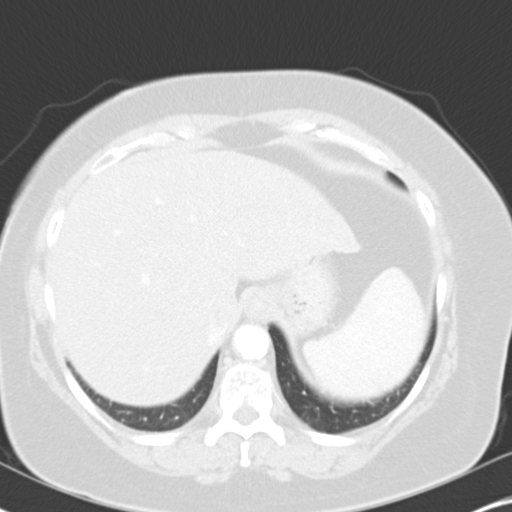
[im 23/61  mediastinal]
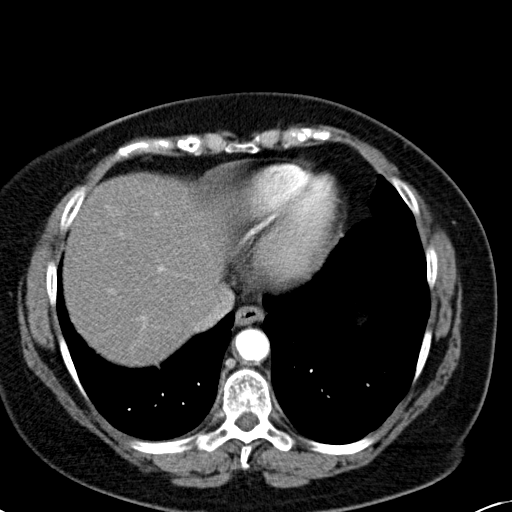
[im 23/61  lung]
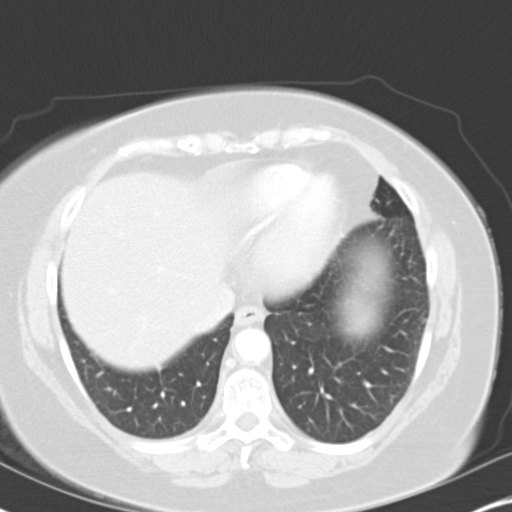
[im 27/61  lung]
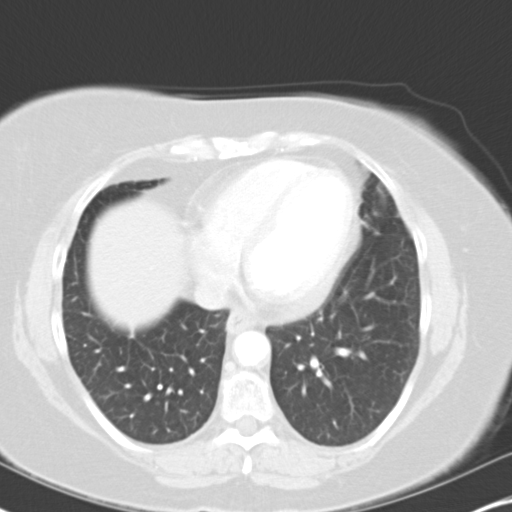
[im 34/61  lung]
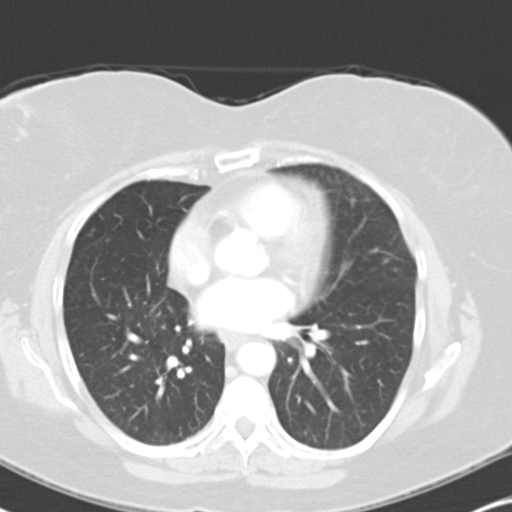
[im 38/61  lung]
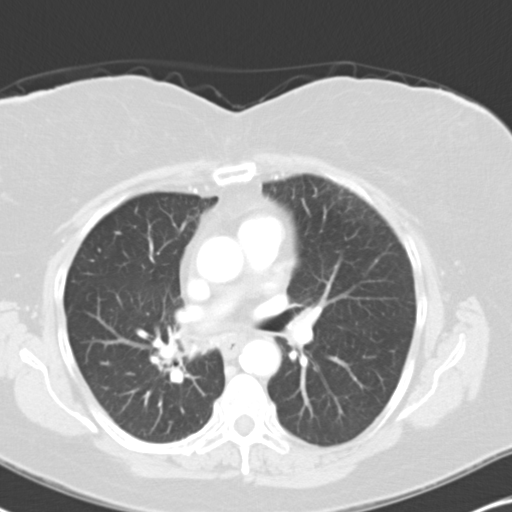
[im 43/61  mediastinal]
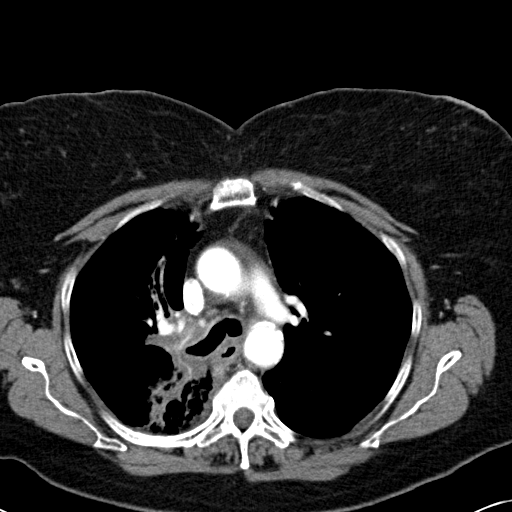
[im 43/61  lung]
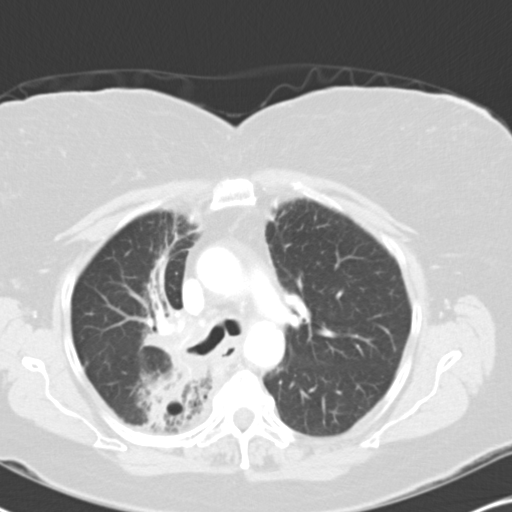
[im 47/61  lung]
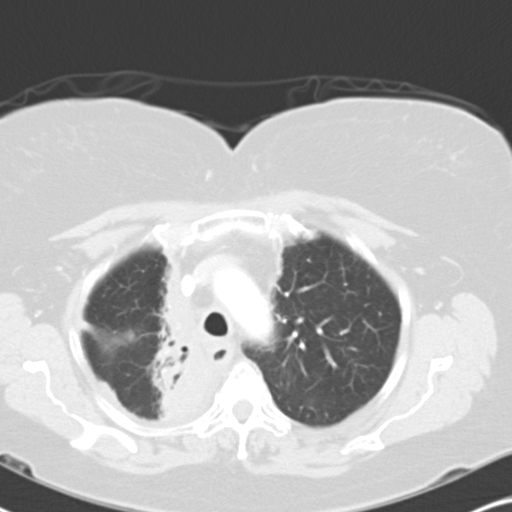
[im 52/61  lung]
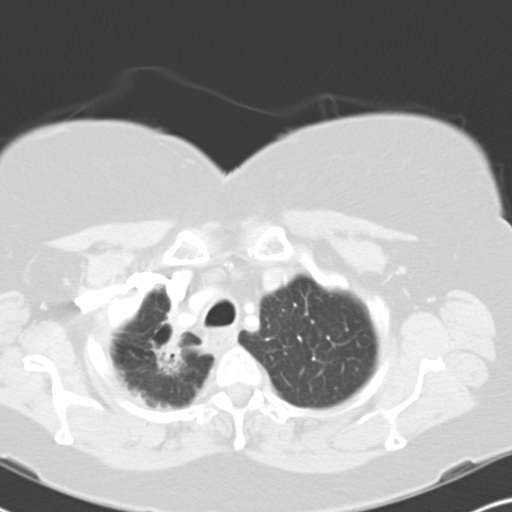
[im 56/61  lung]
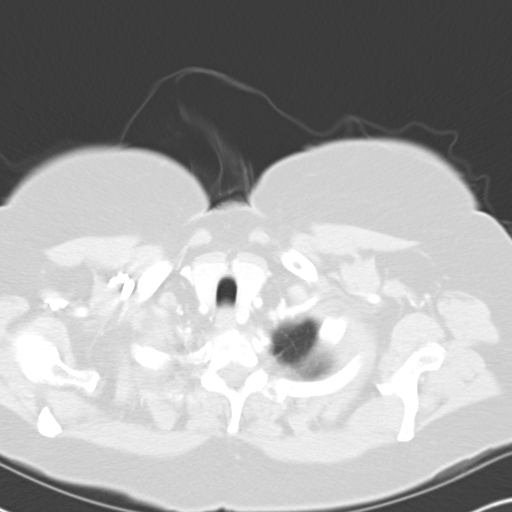

[Series 602: cor · coronal · 0.63mm/px · 3 of 79 slices shown]
[im 16/79  lung]
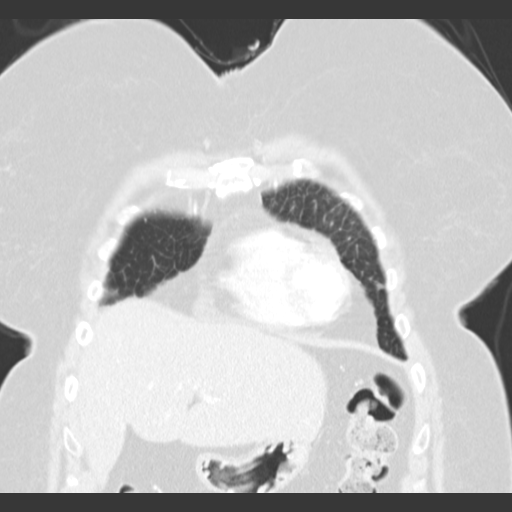
[im 32/79  lung]
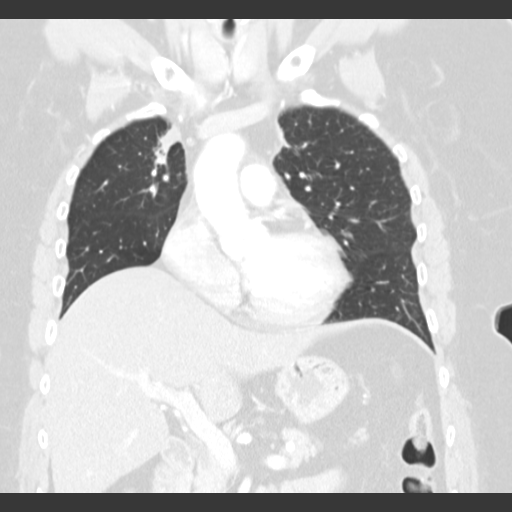
[im 47/79  lung]
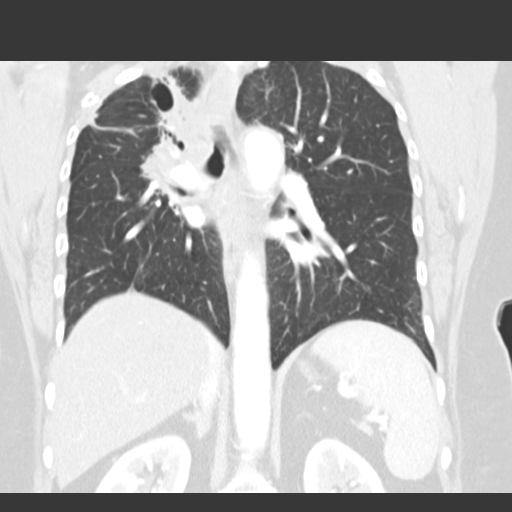

[15 of 36 positions shown; findings below may reference images not displayed]

FINDINGS: Sub cm low-attenuation lesions in the thyroid are unchanged. No
discrete pathologically enlarged mediastinal, left hilar or axillary
lymph nodes. Soft tissue thickening along the lower right
paratracheal region and right mainstem bronchus is unchanged.
Atherosclerotic calcification of the arterial vasculature. Heart is
at the upper limits of normal in size. No pericardial effusion. Mild
esophageal wall thickening may be related to radiation therapy.

Radiation fibrosis and volume loss are seen in the upper right hemi
thorax, medially, unchanged. Probable subpleural atelectasis
dependently in the right lower lobe (image 30). 3 mm posterior right
lower lobe nodule (image 29), unchanged from 08/17/2012. Minimal
subpleural scarring in the left lung. Airway is otherwise
unremarkable.

Incidental imaging of the upper abdomen shows a 9 mm hyper
attenuating lesion in the dome of the liver, similar to the prior
exam. Liver appears slightly decreased in attenuation diffusely and
the margin is slightly irregular. Sub cm low-attenuation lesions in
the left kidney are unchanged and too small to characterize. Adrenal
glands are unremarkable. No worrisome lytic or sclerotic lesions.
Degenerative changes are seen in the spine.
IMPRESSION: 1. Radiation fibrosis and volume loss in the right hemi thorax
without evidence of recurrent or metastatic disease.
2. Fatty liver. Question changes of mild cirrhosis.

## 2014-06-25 ENCOUNTER — Other Ambulatory Visit: Payer: Self-pay | Admitting: Family Medicine

## 2014-06-25 MED ORDER — FLUTICASONE PROPIONATE 50 MCG/ACT NA SUSP: 2.0000 | Freq: Every day | NASAL | Status: DC

## 2014-07-01 ENCOUNTER — Ambulatory Visit (INDEPENDENT_AMBULATORY_CARE_PROVIDER_SITE_OTHER): Payer: Medicare Other | Admitting: Family Medicine

## 2014-07-01 ENCOUNTER — Encounter: Payer: Self-pay | Admitting: Family Medicine

## 2014-07-01 VITALS — BP 120/78 | HR 98 | Temp 98.6°F | Resp 24 | Ht 62.0 in | Wt 178.0 lb

## 2014-07-01 DIAGNOSIS — J209 Acute bronchitis, unspecified: Secondary | ICD-10-CM

## 2014-07-01 MED ORDER — ALBUTEROL SULFATE (2.5 MG/3ML) 0.083% IN NEBU
2.5000 mg | INHALATION_SOLUTION | Freq: Once | RESPIRATORY_TRACT | Status: AC
  Administered 2014-07-01: 2.5 mg via RESPIRATORY_TRACT

## 2014-07-01 MED ORDER — PREDNISONE 20 MG PO TABS: ORAL_TABLET | ORAL | Status: DC

## 2014-07-01 NOTE — Progress Notes (Signed)
Pre visit review using our clinic review tool, if applicable. No additional management support is needed unless otherwise documented below in the visit note. 

## 2014-07-01 NOTE — Patient Instructions (Signed)
Buy over the counter robitussin DM and take as directed on packaging.

## 2014-07-01 NOTE — Progress Notes (Signed)
OFFICE NOTE  07/01/2014  CC:  Chief Complaint  Patient presents with  . Emesis    last night  . Cough   HPI: Patient is a 68 y.o. Caucasian female who is here for cough.  Onset yesterday.  No fever.  Feels SOB. Feels "like I've been run over". NO Chest pain.  No nasal congestion, no runny nose.   No nausea or diarrhea.  She had a severe coughing spell last night that resulted in her vomiting her supper.  She ate this morning and drank coffee.  Pertinent PMH:  Past medical, surgical, social, and family history reviewed and no changes are noted since last office visit.  MEDS:  Outpatient Prescriptions Prior to Visit  Medication Sig Dispense Refill  . albuterol (PROVENTIL) (2.5 MG/3ML) 0.083% nebulizer solution Take 3 mLs (2.5 mg total) by nebulization every 4 (four) hours as needed for wheezing or shortness of breath.  75 mL  6  . aspirin 81 MG tablet Take 81 mg by mouth at bedtime.       . fluticasone (FLONASE) 50 MCG/ACT nasal spray Place 2 sprays into both nostrils at bedtime.  16 g  3  . Insulin Isophane & Regular Human (HUMULIN 70/30 KWIKPEN) (70-30) 100 UNIT/ML PEN 34 U SQ every morning before breakfast and 29 U every evening before supper  15 mL  3  . loratadine (CLARITIN) 10 MG tablet Take 10 mg by mouth at bedtime.       . metFORMIN (GLUCOPHAGE) 500 MG tablet Take 1 tablet (500 mg total) by mouth 2 (two) times daily with a meal.  60 tablet  6  . pantoprazole (PROTONIX) 40 MG tablet TAKE ONE TABLET BY MOUTH ONCE DAILY  30 tablet  11  . simvastatin (ZOCOR) 40 MG tablet Take 1 tablet (40 mg total) by mouth daily.  90 tablet  3  . Blood Glucose Monitoring Suppl (ONE TOUCH ULTRA 2) W/DEVICE KIT 1 each by Other route daily.      . nitroGLYCERIN (NITROSTAT) 0.4 MG SL tablet Place 1 tablet (0.4 mg total) under the tongue every 5 (five) minutes as needed. For chest pain  50 tablet  3  . ONE TOUCH ULTRA TEST test strip 1 each by Other route daily.      [name] DELICA LANCETS 09B MISC  1 each by Other route daily.       No facility-administered medications prior to visit.    PE: Blood pressure 120/78, pulse 98, temperature 98.6 F (37 C), temperature source Temporal, resp. rate 24, height 5' 2"  (1.575 m), weight 178 lb (80.74 kg), SpO2 95.00%. Gen: alert, tired appearing but nontoxic, NAD. Pink, breaths a bit labored with mask on but when she takes mask off this resolves. HEENT: eyes without injection, drainage, or swelling.  Ears: EACs clear, TMs with normal light reflex and landmarks.  Nose: Clear rhinorrhea, with some dried, crusty exudate adherent to mildly injected mucosa.  No purulent d/c.  No paranasal sinus TTP.  No facial swelling.  Throat and mouth without focal lesion.  No pharyngial swelling, erythema, or exudate.   Neck: supple, no LAD.   LUNGS: CTA bilat, nonlabored resps.  Mildly prolonged exp phase.   CV: Regular, tachy, no m/r/g. EXT: no c/c/e SKIN: no rash  IMPRESSION AND PLAN:  Acute bronchitis, suspect viral etiology. Improved subjectively and better aeration/easier breathing s/p albut 2.29m neb in office today. Prednisone 418mqd x 5d.  Albut 2.44m644meb q4h prn at home. Robitussin DM  OTC recommended. Rest, fluids.  FOLLOW UP: if not improving signif in 4-5d, earlier if worse.

## 2014-08-05 ENCOUNTER — Encounter: Payer: Self-pay | Admitting: Family Medicine

## 2014-08-05 IMAGING — CT CT ANGIO CHEST
1 of 2 series · 18 of 32 positions shown · IV contrast (OMNIPAQUE 300)
Comparison: Chest CT -08/19/2013; 02/18/2013; 08/17/2012

CLINICAL DATA: Hemoptysis, cough, shortness of breath, history of
lung cancer, post chemo and radiation therapy, evaluation pulmonary
embolism

EXAM:
CT ANGIOGRAPHY CHEST WITH CONTRAST
TECHNIQUE: Multidetector CT imaging of the chest was performed using the
standard protocol during bolus administration of intravenous
contrast. Multiplanar CT image reconstructions including MIPs were
obtained to evaluate the vascular anatomy.
CONTRAST:  100mL OMNIPAQUE IOHEXOL 350 MG/ML SOLN

[Series 10: thins for pacs · axial · 0.64mm/px · z∈[+1398,+1612]mm · 18 of 239 slices shown]
[im 12/239  lung]
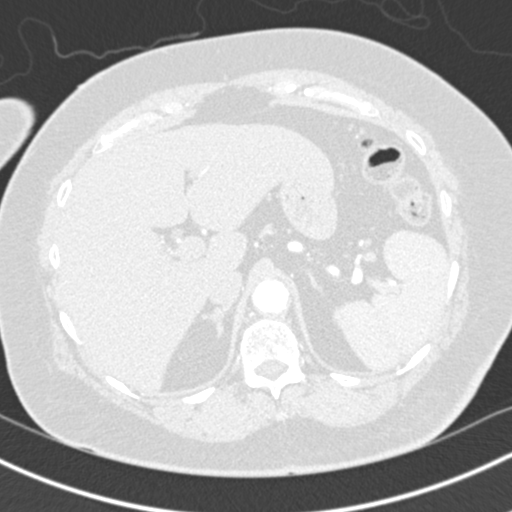
[im 24/239  mediastinal]
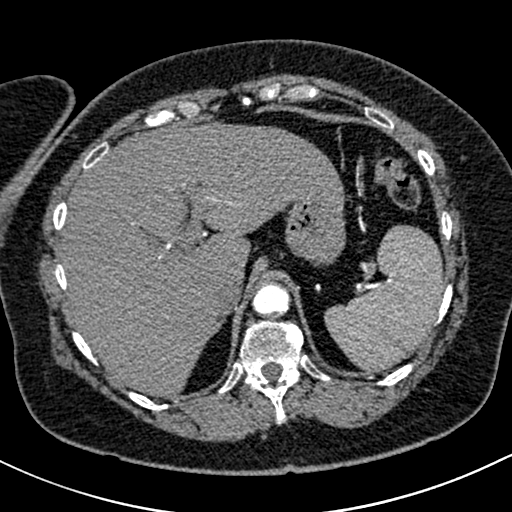
[im 48/239  lung]
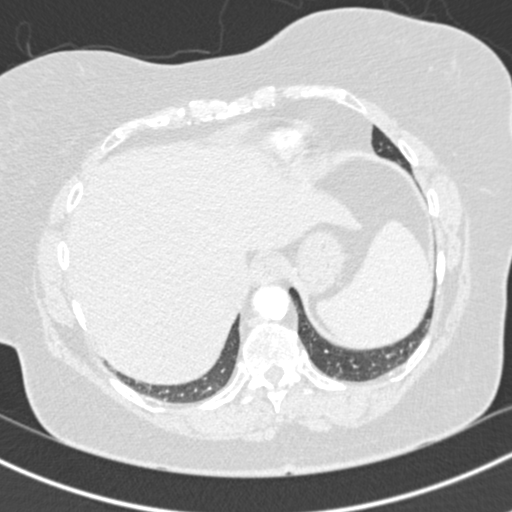
[im 60/239  mediastinal]
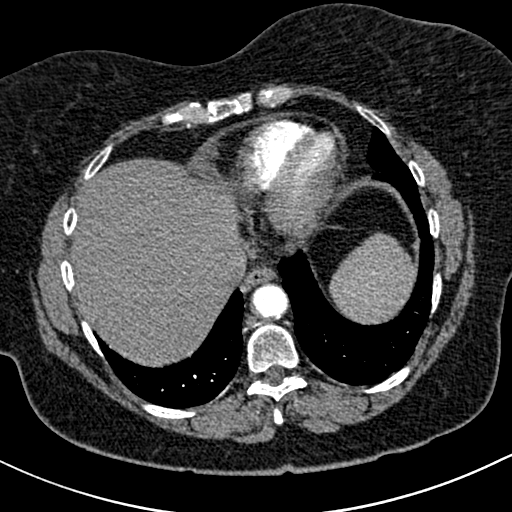
[im 72/239  lung]
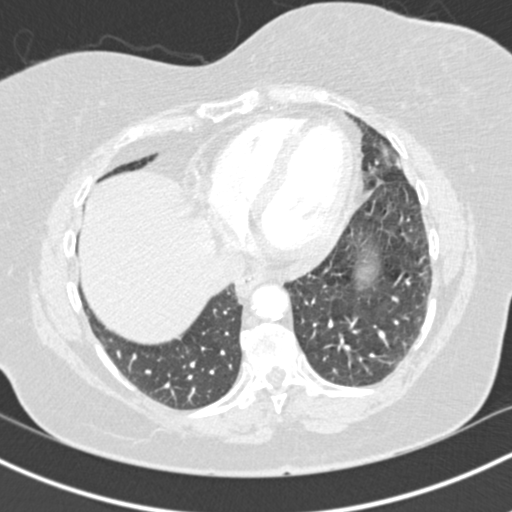
[im 80/239  mediastinal]
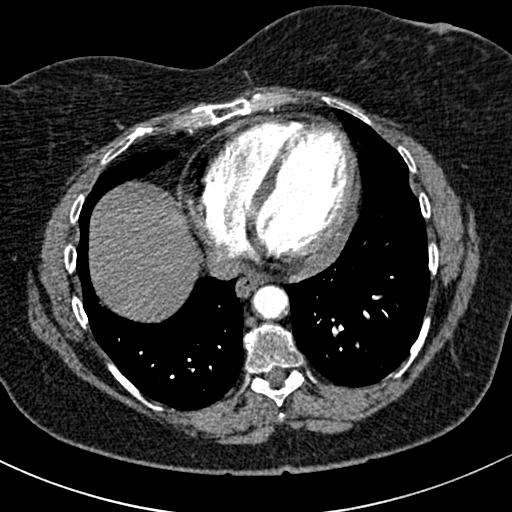
[im 84/239  lung]
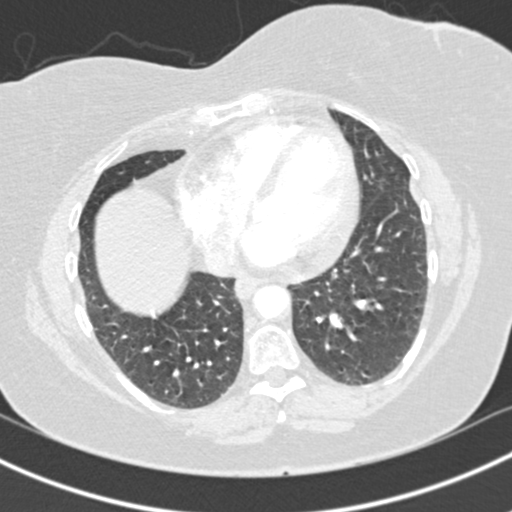
[im 108/239  mediastinal]
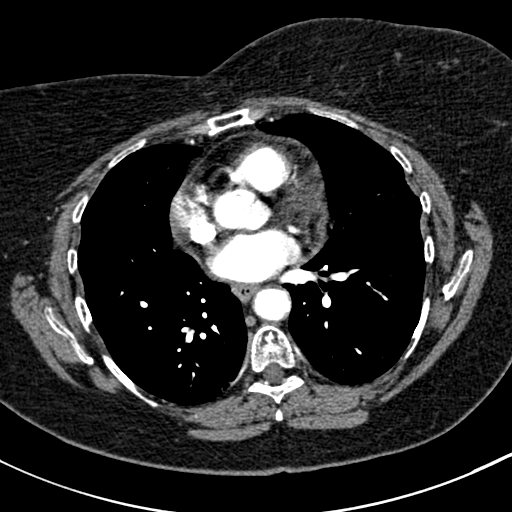
[im 110/239  lung]
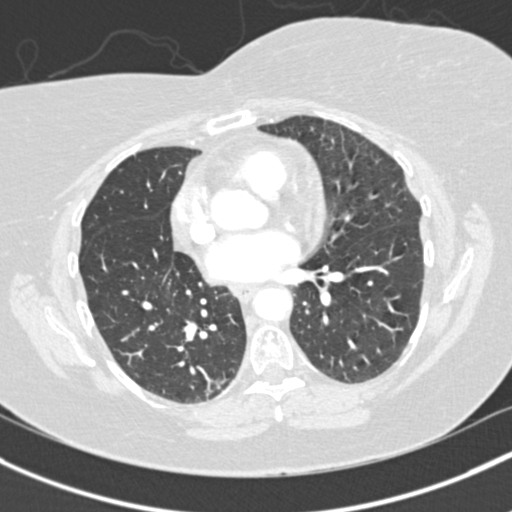
[im 120/239  mediastinal]
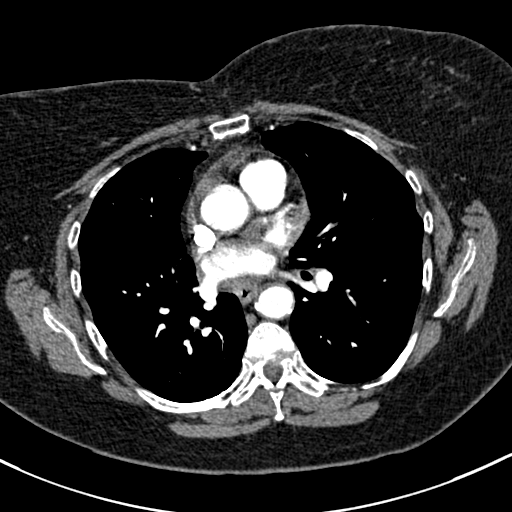
[im 131/239  lung]
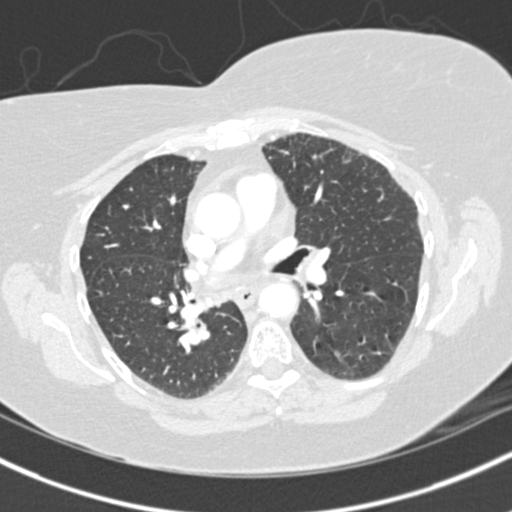
[im 155/239  mediastinal]
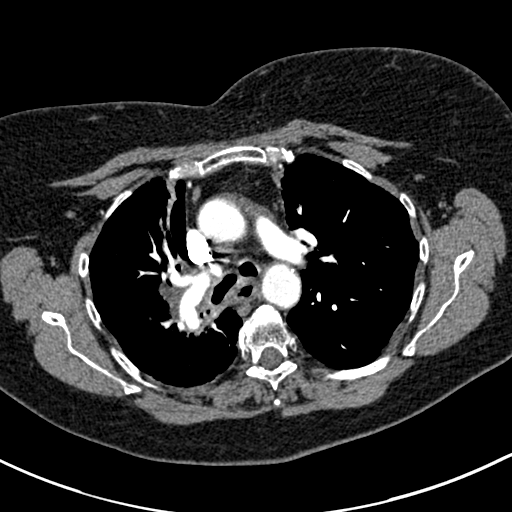
[im 159/239  lung]
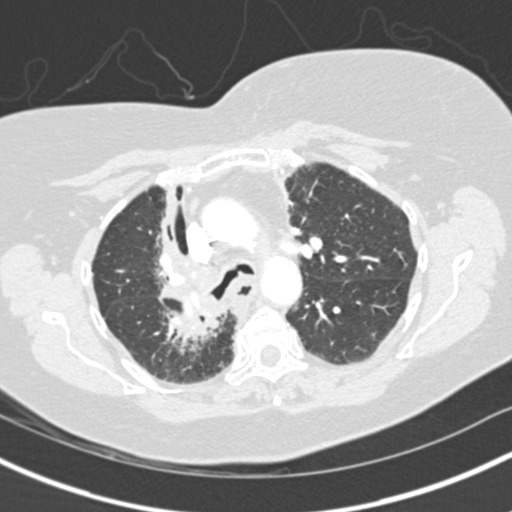
[im 167/239  mediastinal]
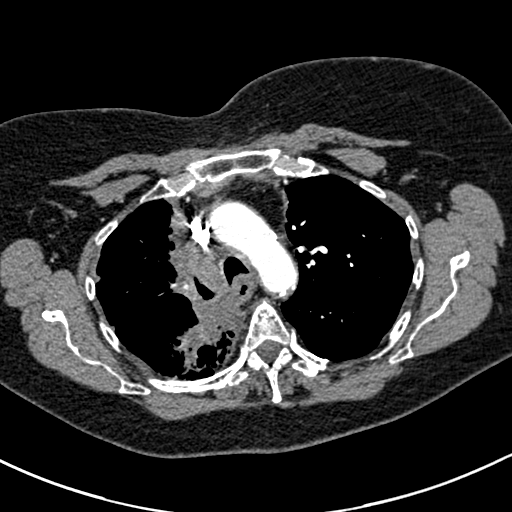
[im 179/239  lung]
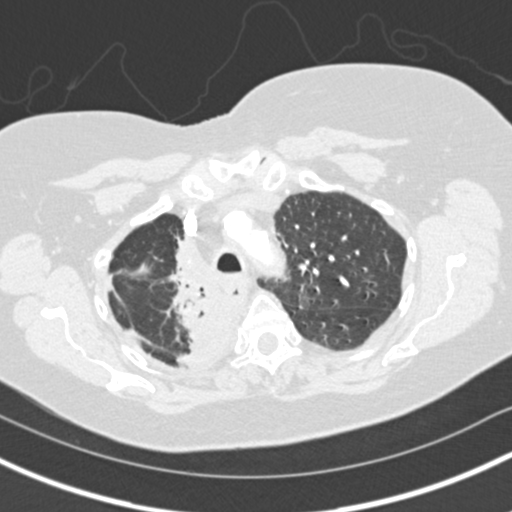
[im 191/239  mediastinal]
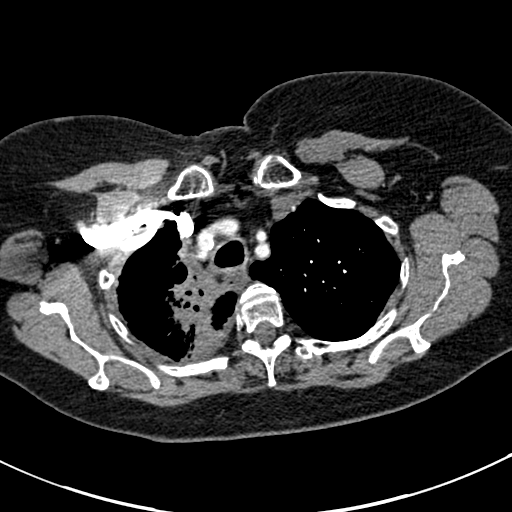
[im 215/239  lung]
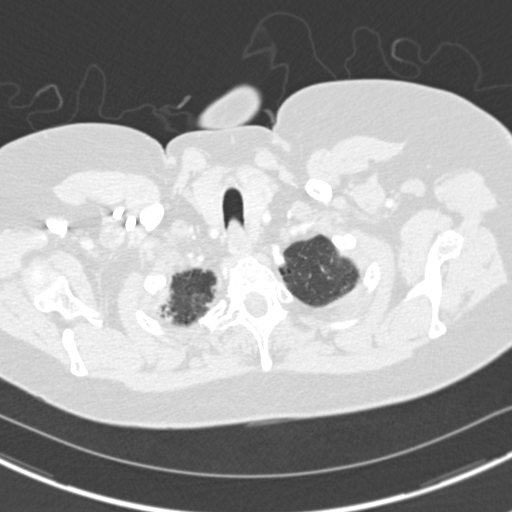
[im 227/239  mediastinal]
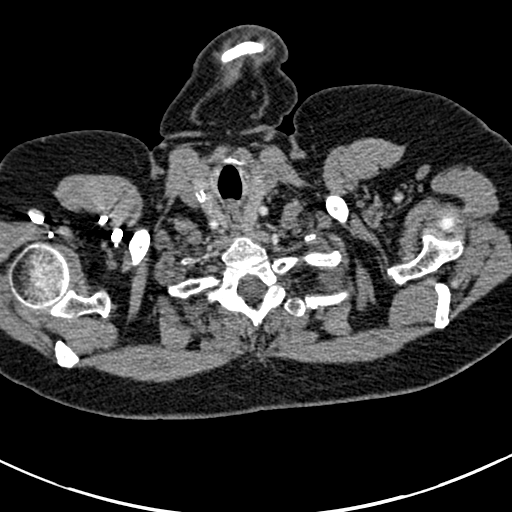

[18 of 32 positions shown; findings below may reference images not displayed]

FINDINGS: Vascular Findings:

There is adequate opacification of the pulmonary arterial system
with the main pulmonary artery measuring 237 Hounsfield units. There
are no discrete filling defects within the pulmonary arterial tree
to suggest pulmonary embolism. Normal caliber the main pulmonary
artery.

Borderline cardiomegaly. Trace amount of pericardial fluid,
presumably physiologic.

Scattered atherosclerotic plaque with a normal thoracic aorta.
Bovine configuration of the aortic arch is incidentally noted. The
branch vessels of the aortic arch are patent throughout their imaged
course. No thoracic aortic dissection or periaortic stranding.

Review of the MIP images confirms the above findings.

----------------------------------------------------------------------------------

Nonvascular Findings:

There is persistent right upper lobe predominant volume loss and
paramediastinal radiation change with associated consolidative
opacities an regional perihilar bronchiectasis. Unchanged focal
nidus heel within the right upper lobe. Minimal subpleural
ground-glass atelectasis within the left lower lobe. No new focal
airspace opacities. No pleural effusion or pneumothorax. Unchanged
appearance of the central pulmonary airways.

No mediastinal, hilar axillary lymphadenopathy.

Early arterial phase evaluation of the upper abdomen demonstrates
sequela of prior cholecystectomy.

No acute or aggressive osseous abnormalities. There is heterogeneity
of the thyroid gland with associated peripherally calcified
approximately 1.0 x 0.6 cm right-sided thyroid gland nodule (image
6, series 4) and a suspected approximately 0.5 x 0.9 cm left-sided
thyroid nodule (image 8).
IMPRESSION: 1. No acute cardiopulmonary disease. Specifically, no evidence of
pulmonary embolism.
2. Grossly stable sequela of radiation change to the right upper
lobe and hilum with associated volume loss, architectural distortion
and bronchiectasis.
3. Bilateral indeterminate thyroid nodules, the largest of which
within the right lobe appears partially peripherally calcified and
measures approximately 1 cm in diameter. Further evaluation with
nonemergent thyroid ultrasound could be performed as clinically
indicated.

## 2014-08-05 IMAGING — CR DG THORACIC SPINE 2V
3 series · 3 of 3 positions shown · non-contrast
Comparison: Chest x-ray today and previous chest CT on 08/19/2013

CLINICAL DATA: Lower back pain today after falling. Shortness of
breath. History of lung cancer 5 years ago.

EXAM:
THORACIC SPINE - 2 VIEW

[t thoracic spine ap]
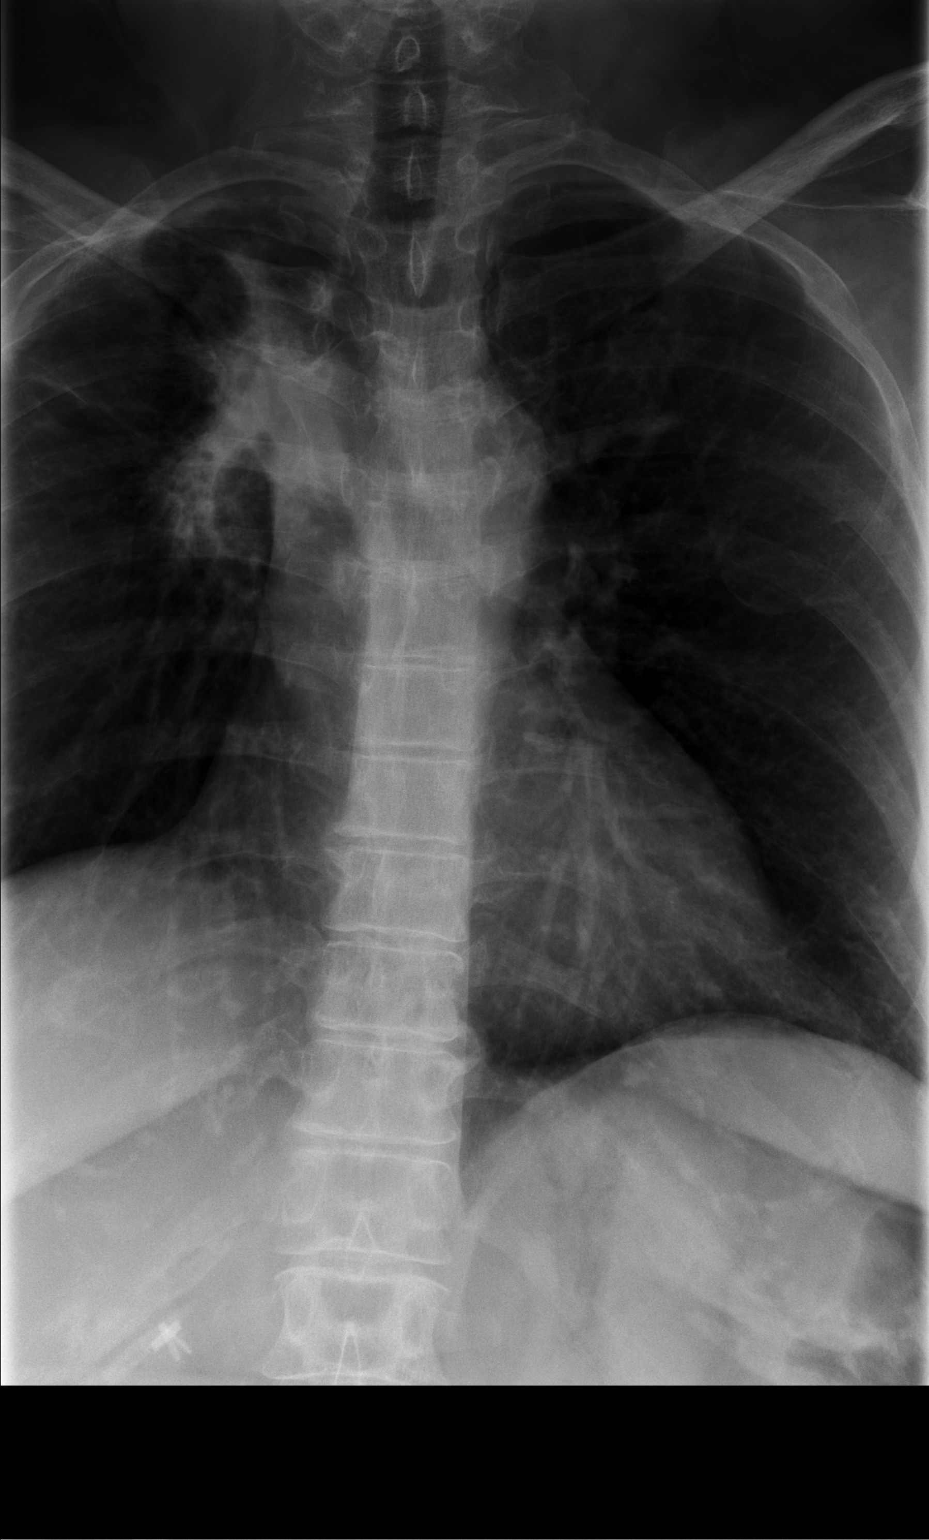

[t thoracic spine lat]
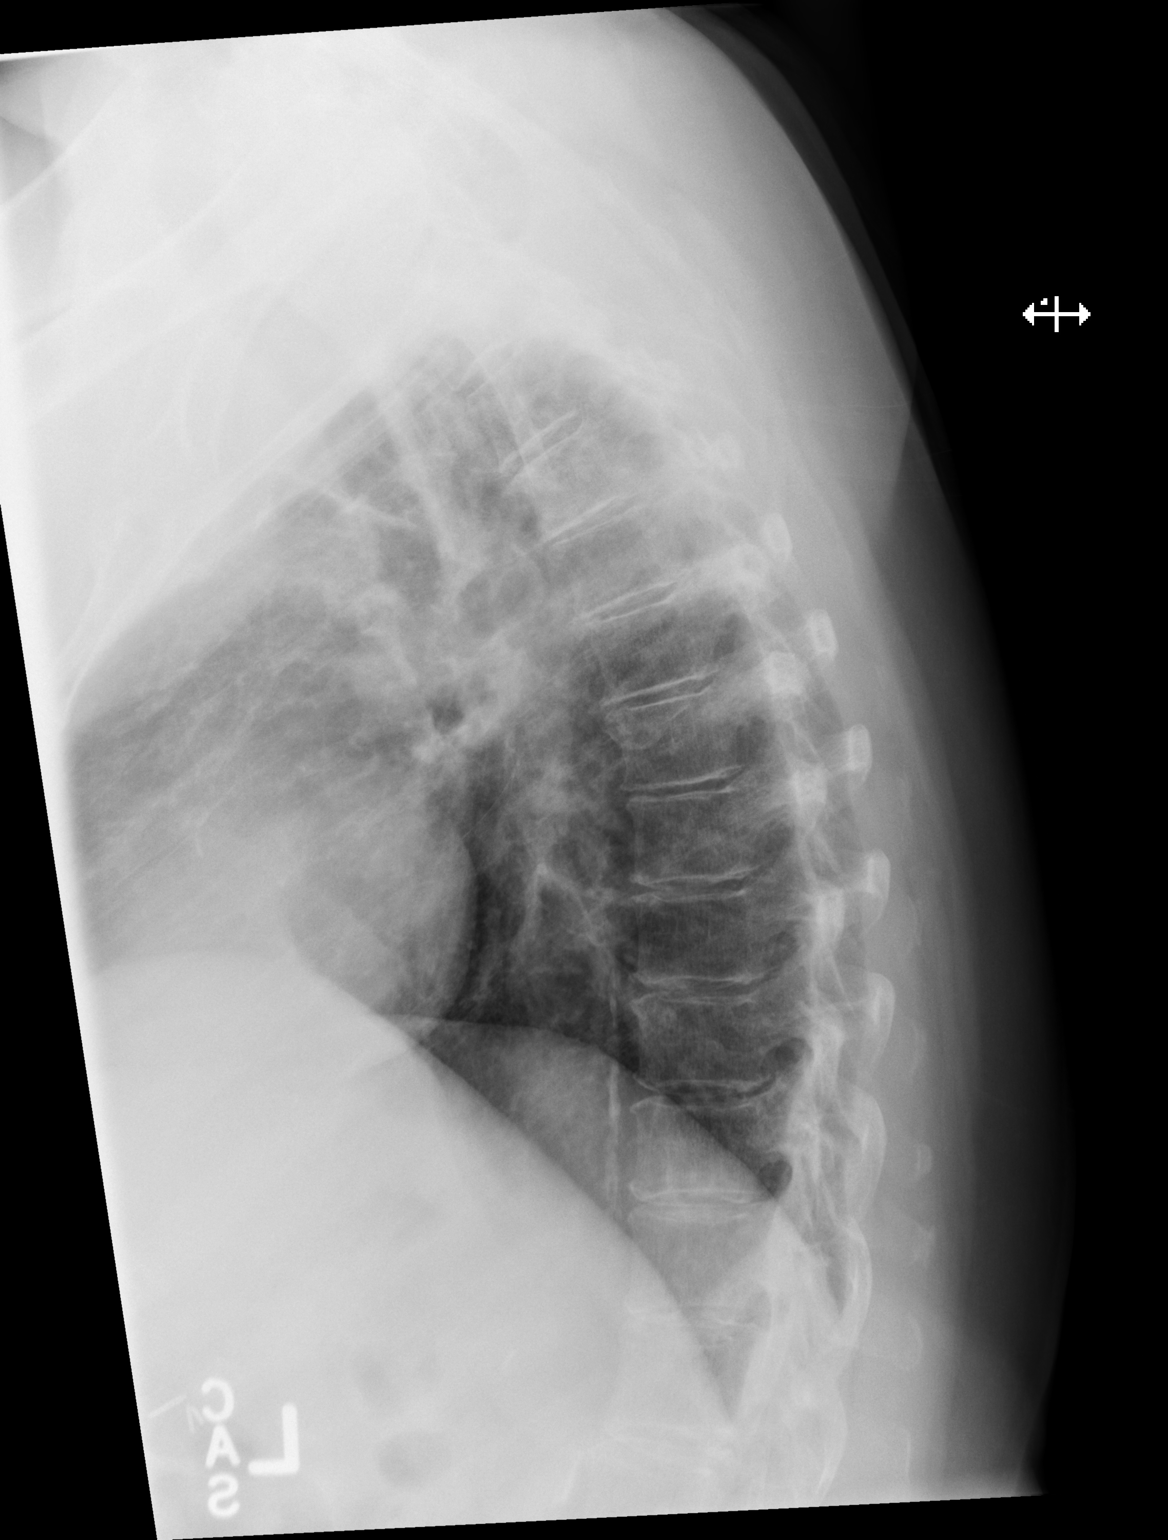

[t thoracic swimmers]
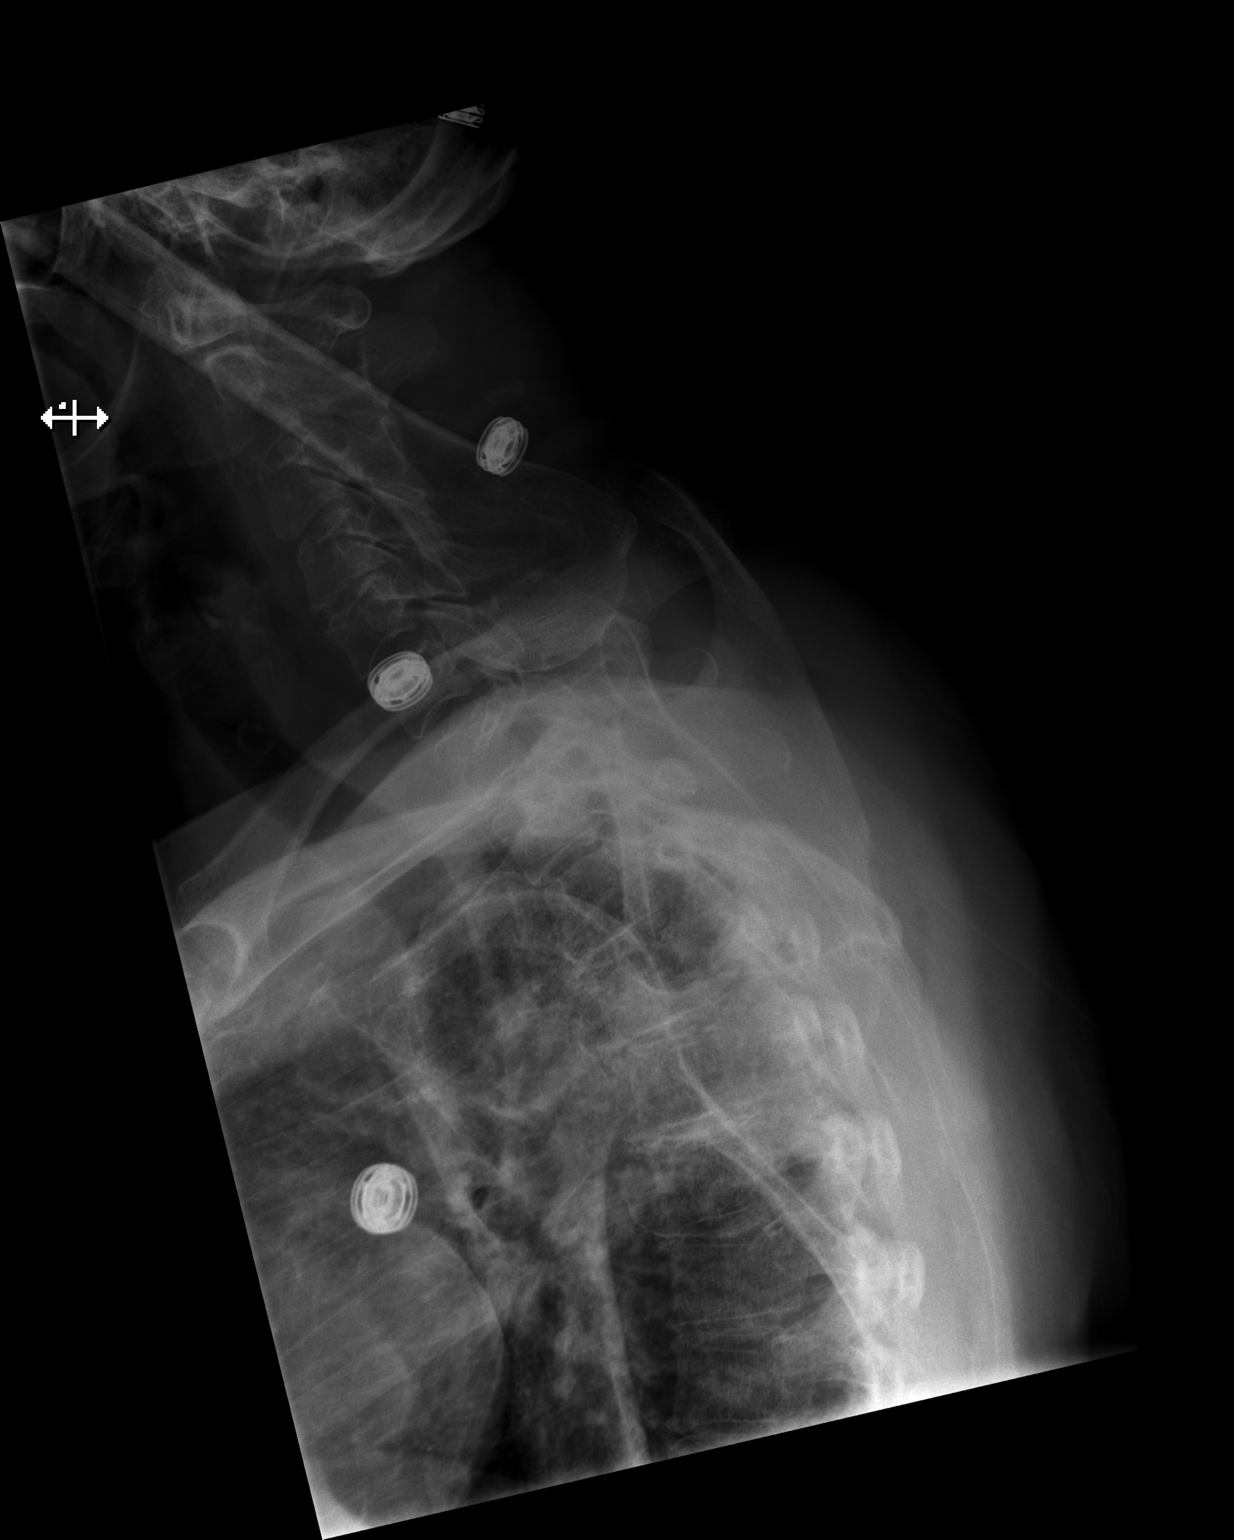

[3 of 3 positions shown; findings below may reference images not displayed]

FINDINGS: Alignment is normal. There is mild mid thoracic degenerative change.
No suspicious lytic or blastic lesions are identified. There is no
evidence for acute fracture or subluxation. Right perihilar and
right paratracheal changes are consistent with previous lung cancer
treatment, similar in appearance to previous chest CT.
IMPRESSION: 1. No evidence for acute abnormality.
2. Posttreatment changes involving the right lung.

## 2014-08-05 IMAGING — CR DG CHEST 2V
2 series · 2 of 2 positions shown · non-contrast
Comparison: 08/19/2013 CT of the chest and 02/03/2013 chest x-ray

CLINICAL DATA: Lower back pain today after falling. Shortness of
breath. History of lung cancer 5 years ago.

EXAM:
CHEST  2 VIEW

[w chest pa]
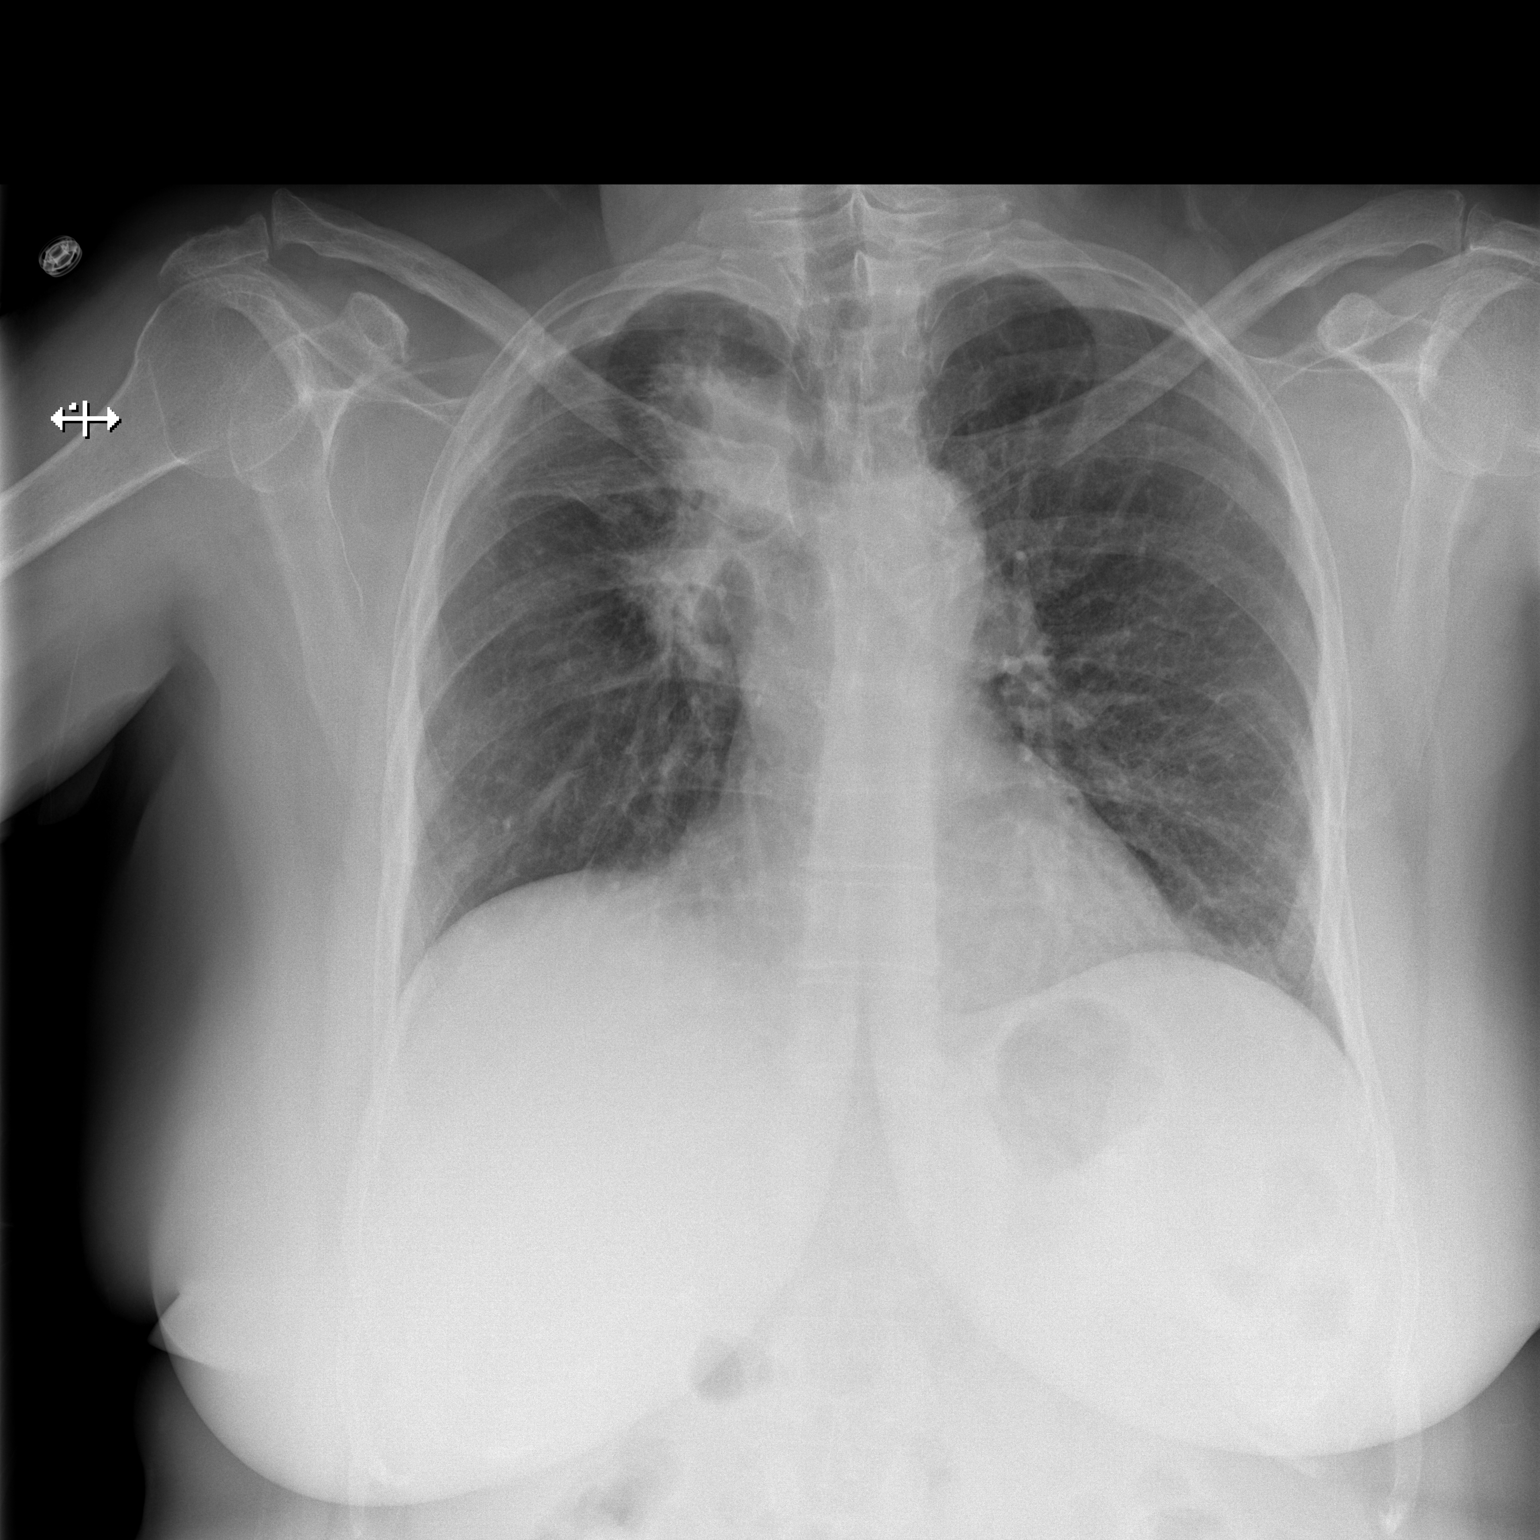

[w chest lat]
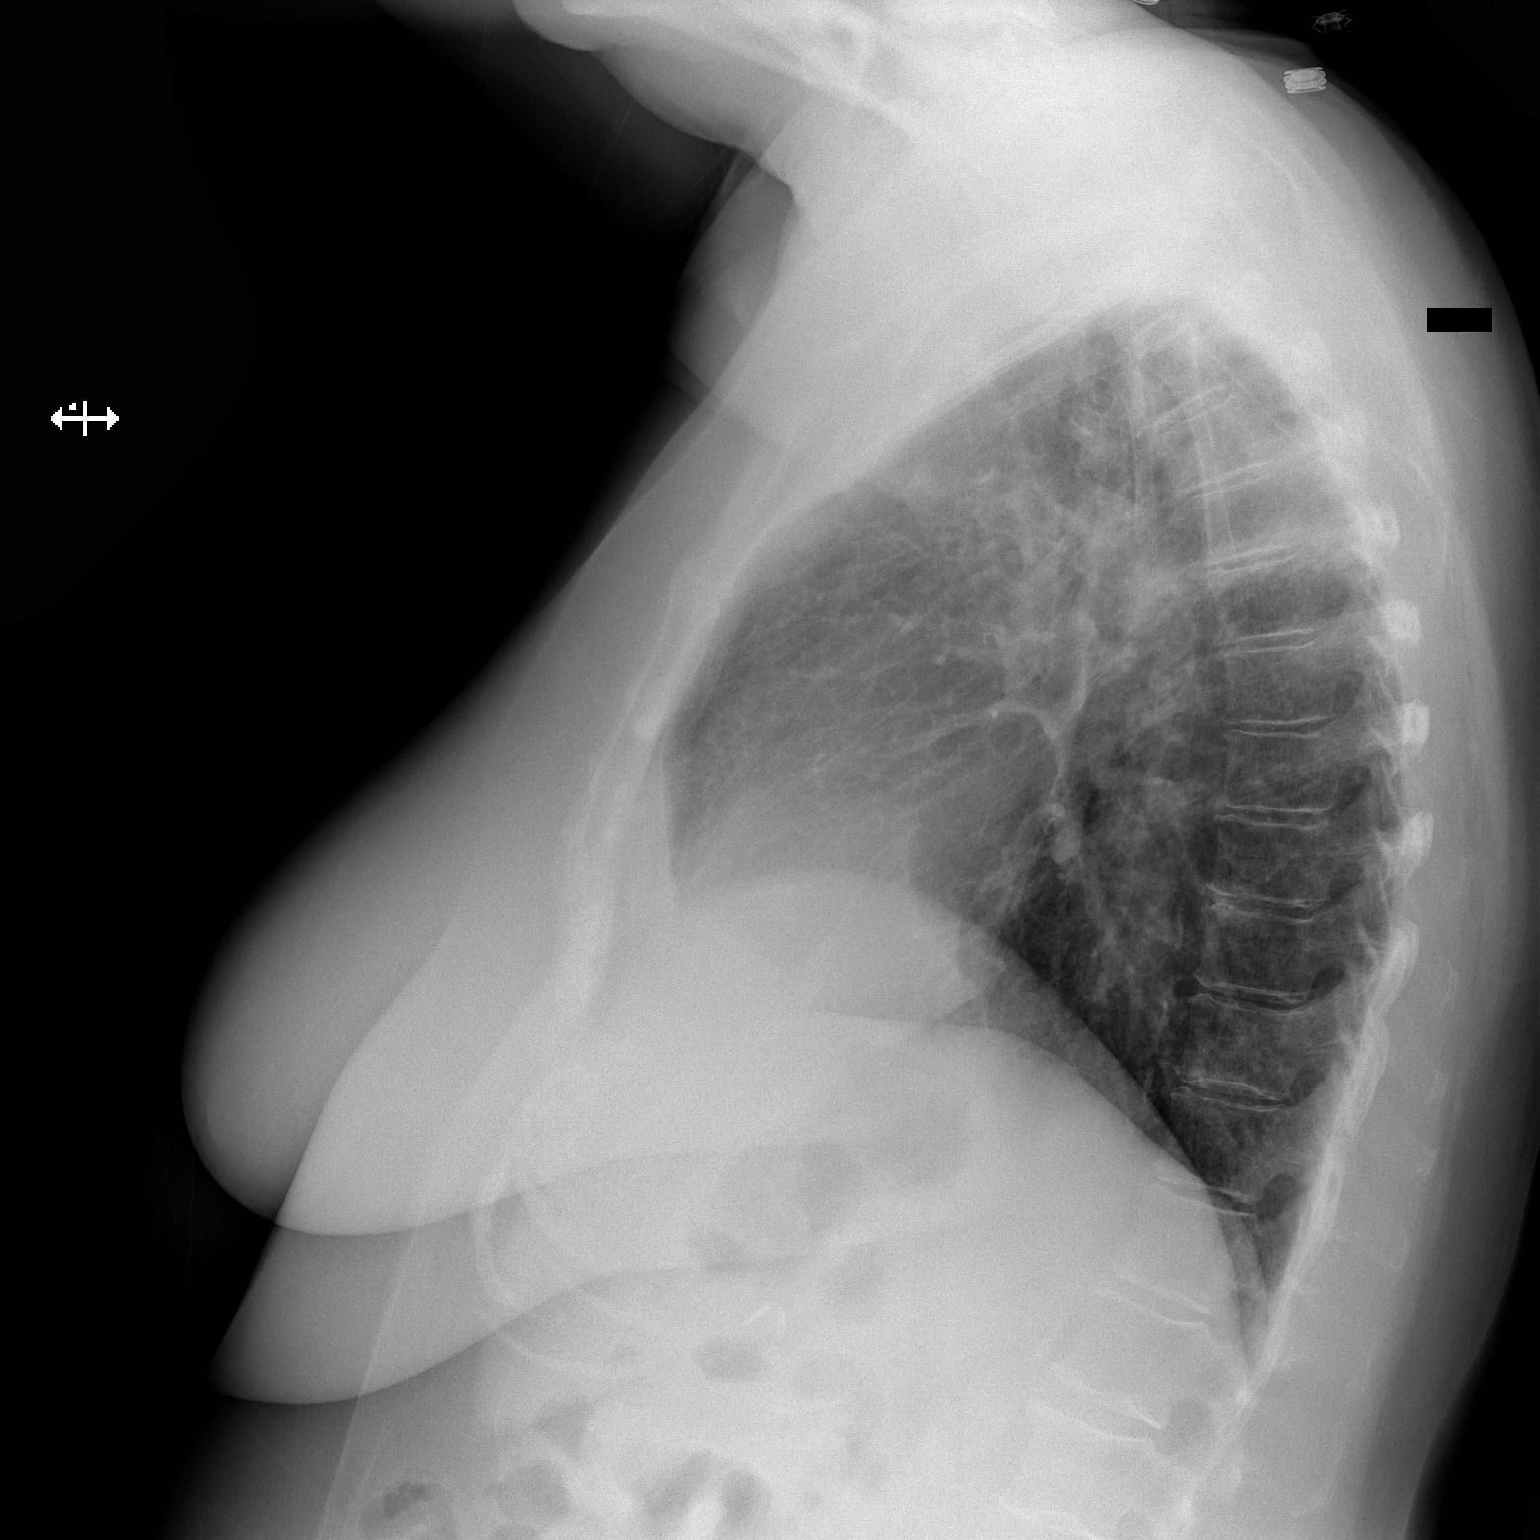

[2 of 2 positions shown; findings below may reference images not displayed]

FINDINGS: Heart size is normal. There is right peritracheal and right hilar
densities consistent with history of lung cancer and post treatment
changes. There is right apical pleural thickening, likely
representing scarring. There are no new focal consolidations or
pleural effusions. Surgical clips are identified in the upper
abdomen. No suspicious lytic or blastic lesions are identified in
the spine. No evidence for acute thoracic fracture.
IMPRESSION: 1. Posttreatment changes involving the right lung.
2. No evidence for acute abnormality of the chest.

## 2014-08-05 IMAGING — CR DG LUMBAR SPINE COMPLETE 4+V
5 series · 5 of 5 positions shown · non-contrast
Comparison: None.

CLINICAL DATA: Pain post trauma; history of lung carcinoma

EXAM:
LUMBAR SPINE - COMPLETE 4+ VIEW

[t lumbar spine ap]
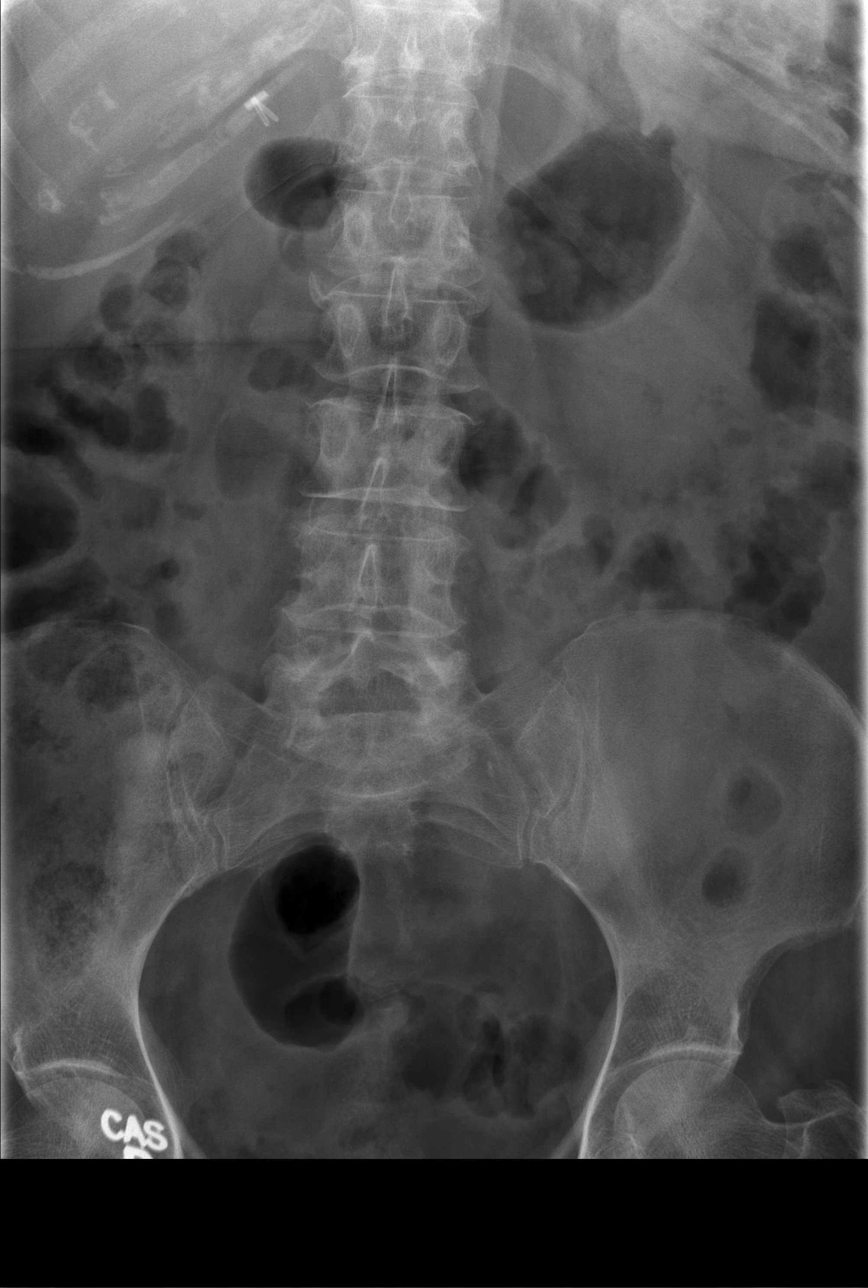

[t lumbar spine obl (1 of 2)]
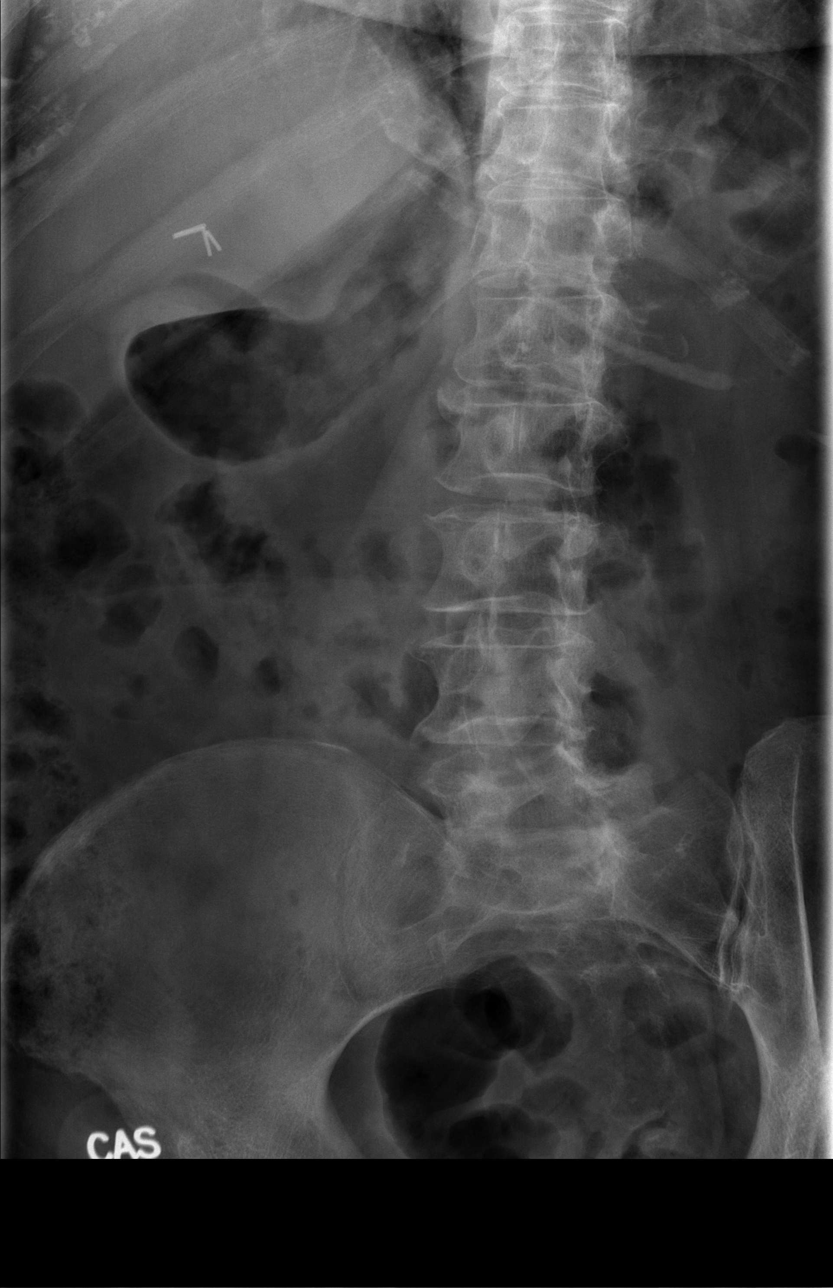

[t lumbar spine obl (2 of 2)]
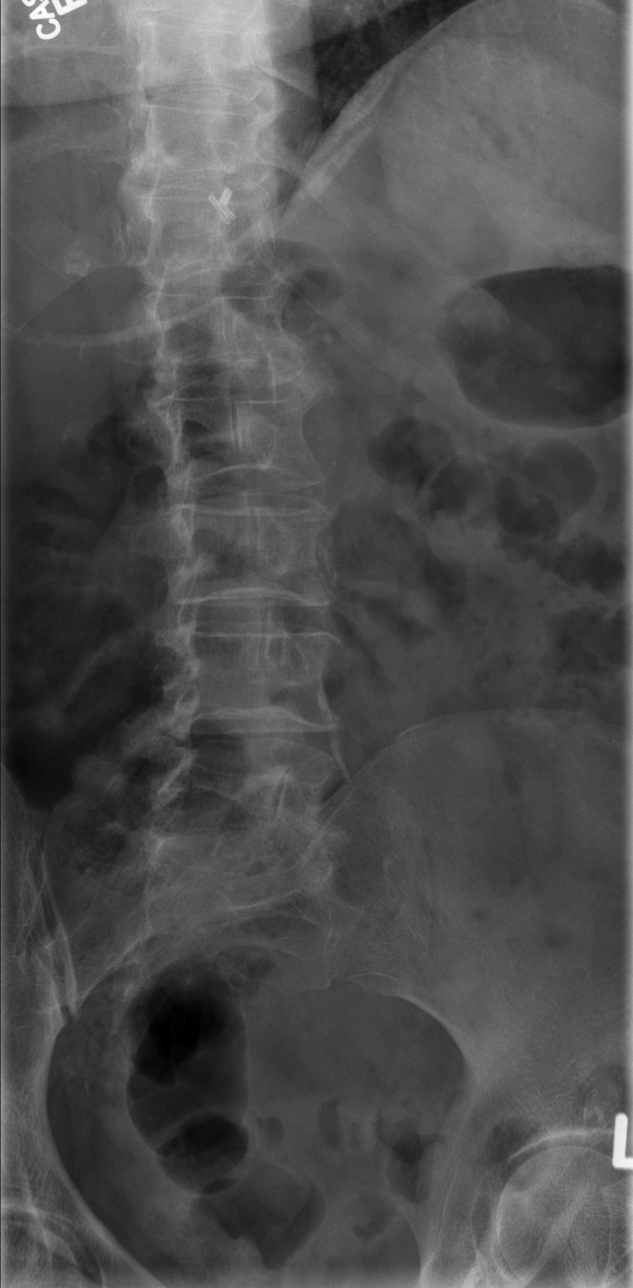

[t lumbar spine lat]
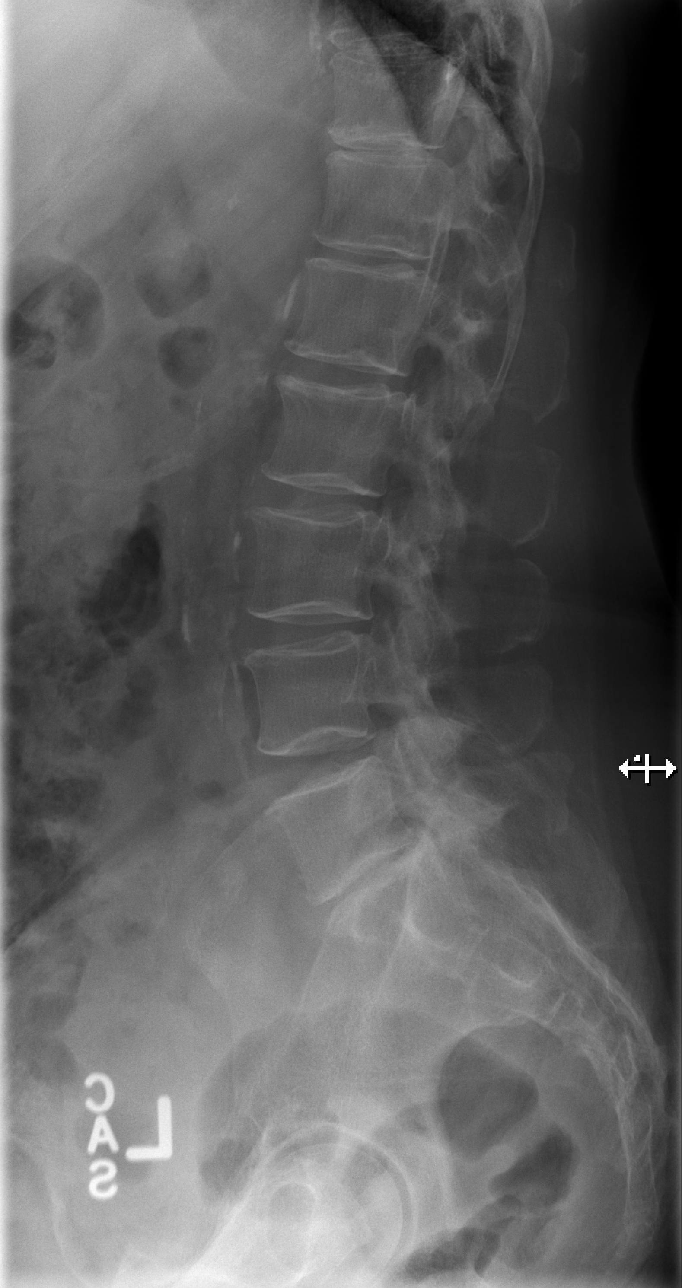

[t lumbar l-5 s-1 spot]
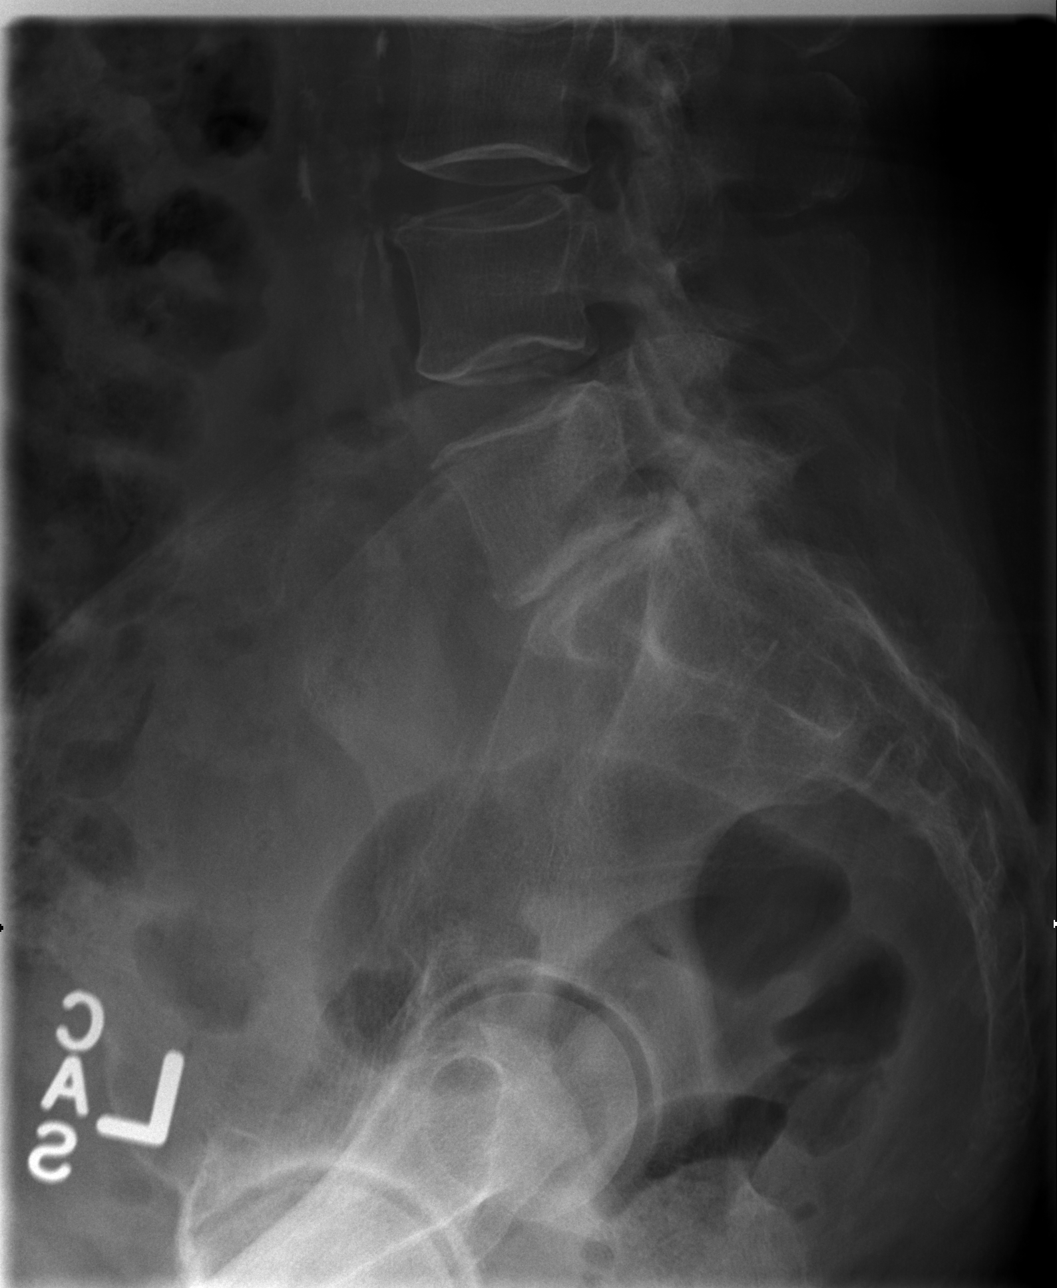

[5 of 5 positions shown; findings below may reference images not displayed]

FINDINGS: Frontal, lateral, spot lumbosacral lateral, and bilateral oblique
views were obtained. There are 5 non-rib-bearing lumbar type
vertebral bodies. There is no fracture or spondylolisthesis. There
is moderately severe disc space narrowing at L5-S1. Other disc
spaces appear unremarkable. There is facet osteoarthritic change at
L4-5 and L5-S1 bilaterally. There are no blastic or lytic bone
lesions.
IMPRESSION: Osteoarthritic change, most notably at L5-S1. No fracture or
spondylolisthesis.

## 2014-08-12 IMAGING — US US SOFT TISSUE HEAD/NECK
1 series · 14 of 25 positions shown · non-contrast
Comparison: Thyroid ultrasound 05/21/2004.

CLINICAL DATA: Thyroid nodules.

EXAM:
THYROID ULTRASOUND
TECHNIQUE: Ultrasound examination of the thyroid gland and adjacent soft
tissues was performed.

[Series 1: us soft tissue head/neck · 0.08mm/px · 14 of 61 slices shown]
[im 1/61]
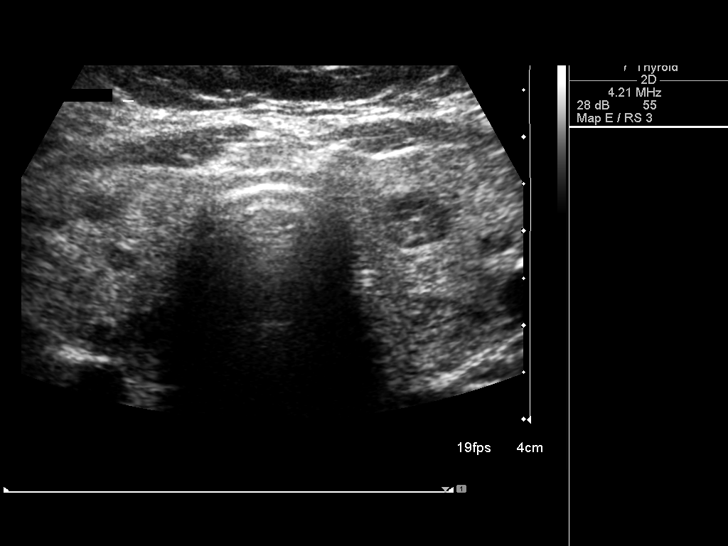
[im 6/61]
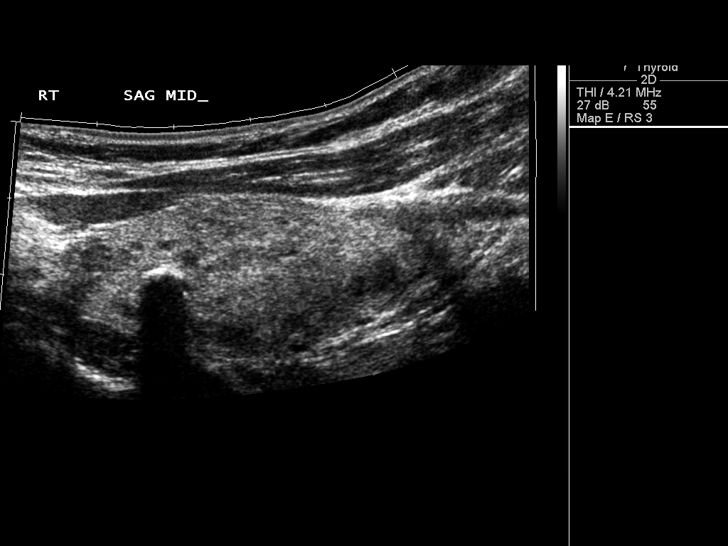
[im 11/61]
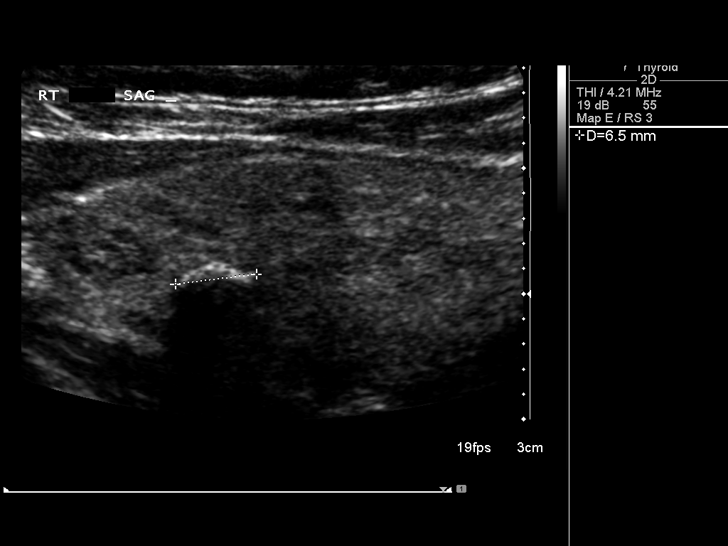
[im 16/61]
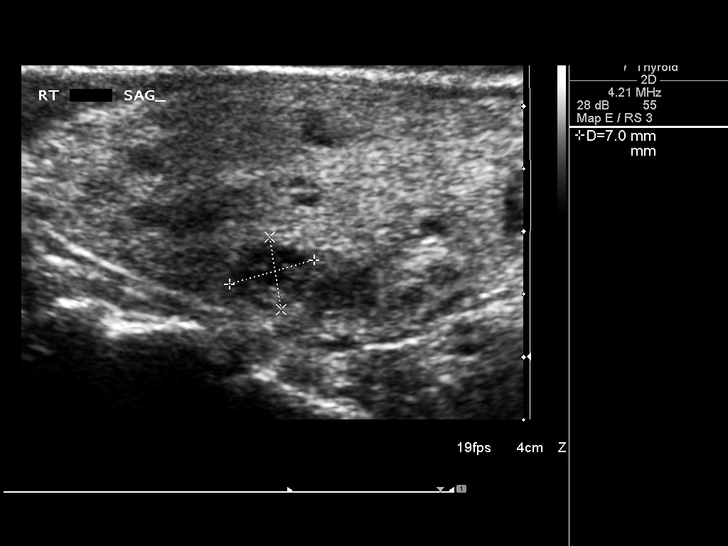
[im 21/61]
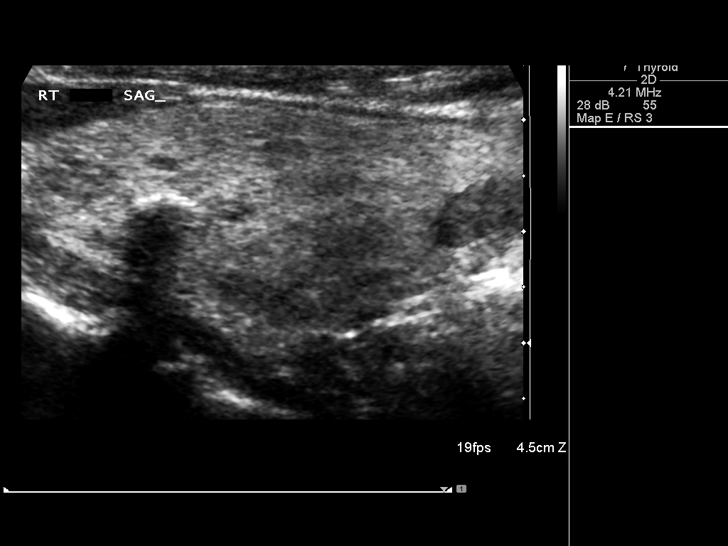
[im 23/61]
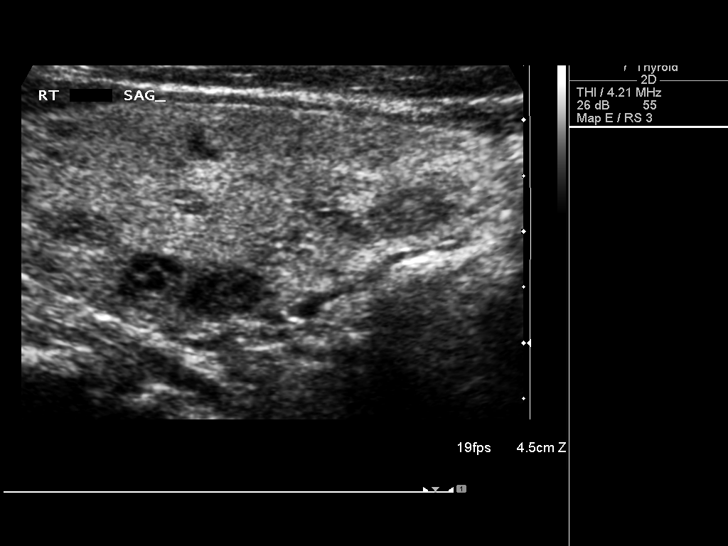
[im 28/61]
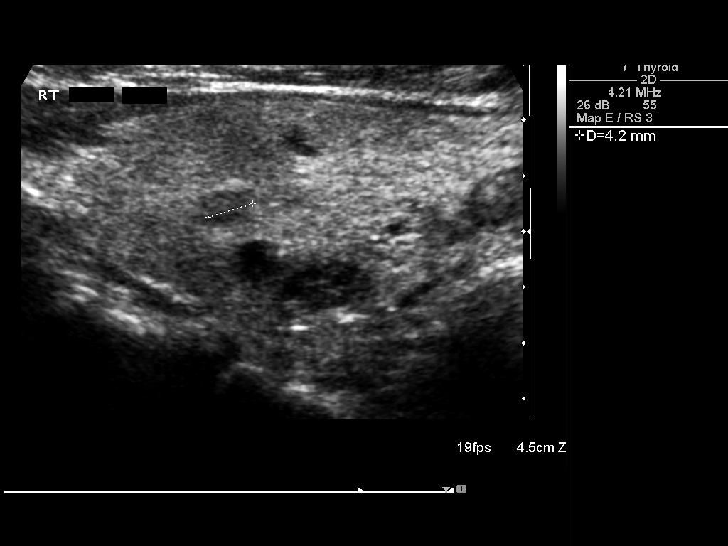
[im 33/61]
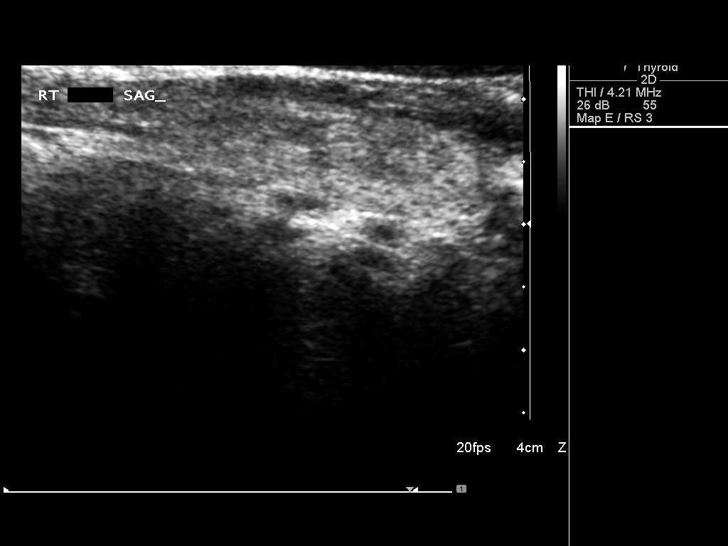
[im 38/61]
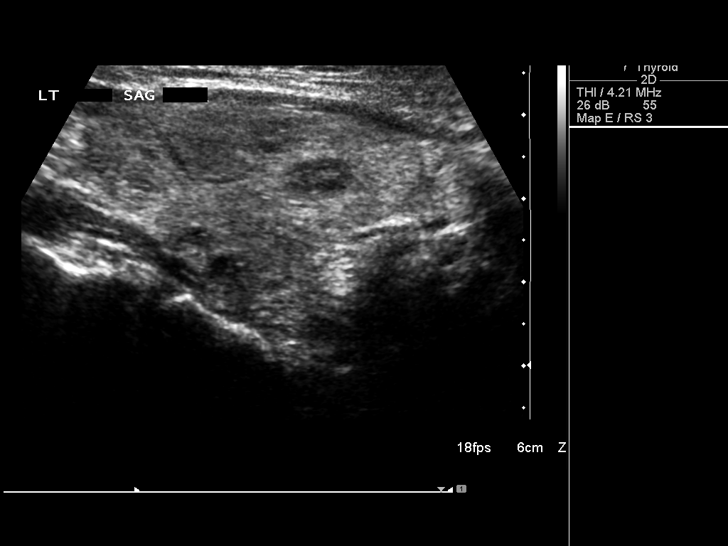
[im 41/61]
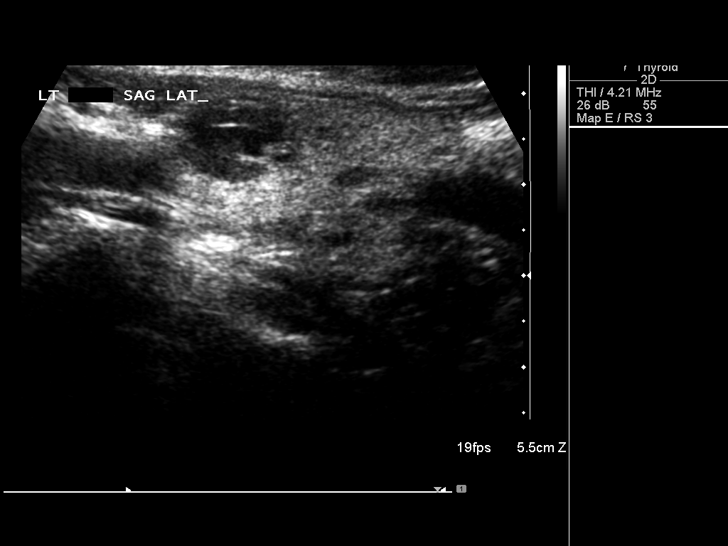
[im 46/61]
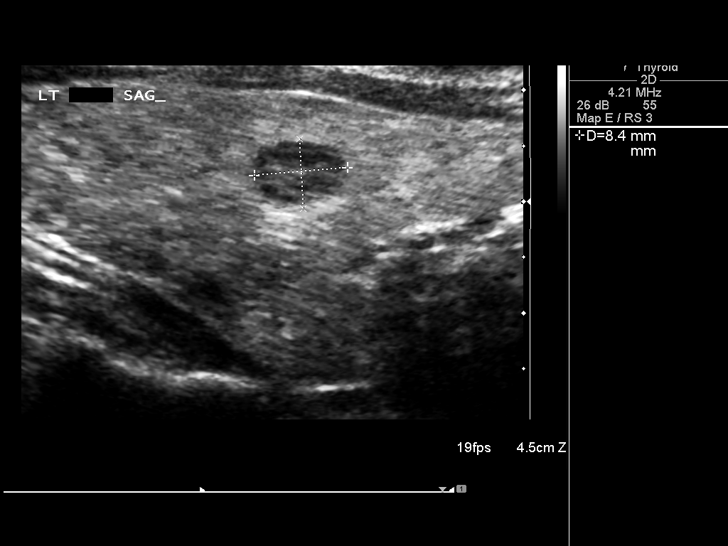
[im 51/61]
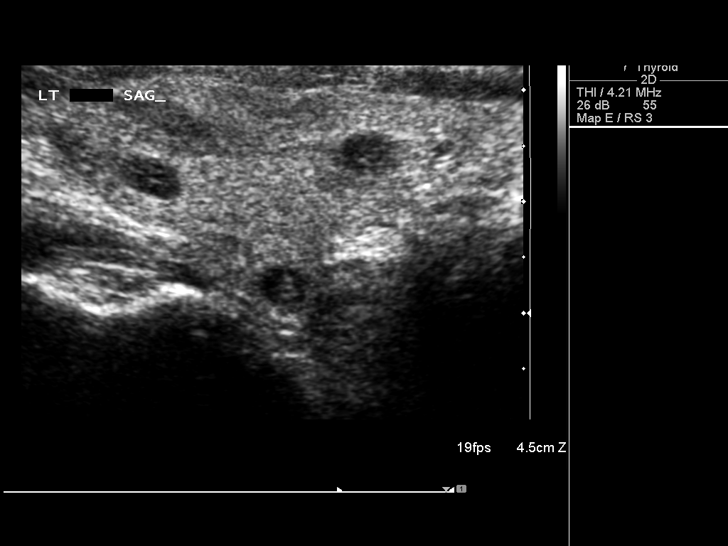
[im 56/61]
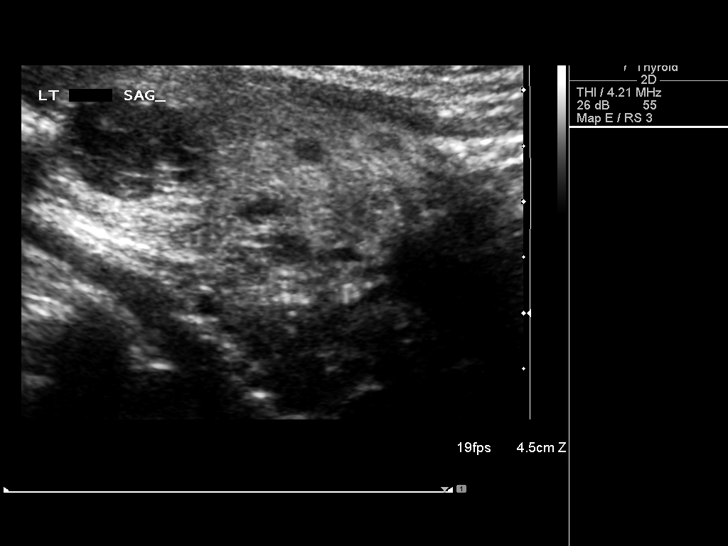
[im 61/61]
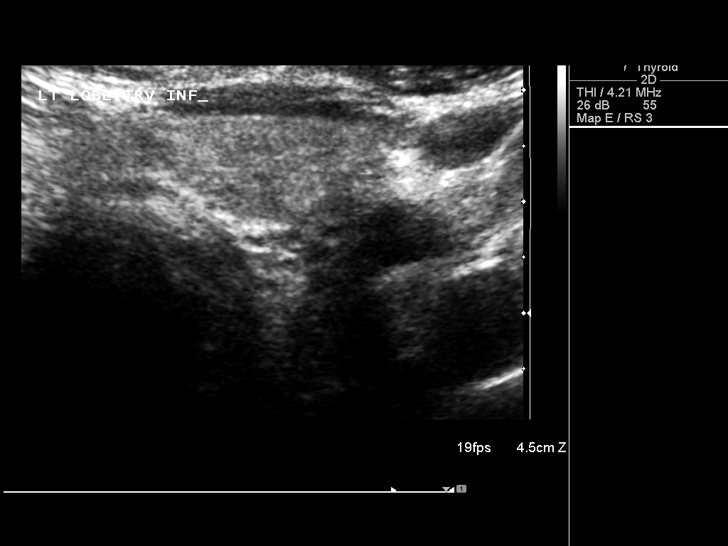

[14 of 25 positions shown; findings below may reference images not displayed]

FINDINGS: Right thyroid lobe

Measurements: 5.7 x 2.2 x 2.1 cm. Numerous small nodules are noted
throughout the right gland. The largest measures 1.1 x 0.6 x 1.0 cm
in the inferior aspect of the right lobe.

Left thyroid lobe

Measurements: 5.0 x 2.9 x 2.2 cm. Multiple small nodules throughout
the left lobe. The largest measures 1.4 x 1.0 x 1.2 cm in the upper
lobe area.

Isthmus

Thickness: 0.3 cm.  No nodules visualized.

Lymphadenopathy

None visualized.
IMPRESSION: 1. Multinodular thyroid goiter.
2. Recommend a followup ultrasound examination in 12 months mainly
to re-evaluate the left upper lobe lesion.

## 2014-08-20 ENCOUNTER — Other Ambulatory Visit: Payer: Self-pay | Admitting: Internal Medicine

## 2014-08-21 ENCOUNTER — Ambulatory Visit (INDEPENDENT_AMBULATORY_CARE_PROVIDER_SITE_OTHER): Payer: Medicare Other | Admitting: Family Medicine

## 2014-08-21 ENCOUNTER — Encounter: Payer: Self-pay | Admitting: Family Medicine

## 2014-08-21 VITALS — BP 116/77 | HR 86 | Temp 97.9°F | Resp 18 | Ht 62.0 in | Wt 178.0 lb

## 2014-08-21 DIAGNOSIS — IMO0002 Reserved for concepts with insufficient information to code with codable children: Secondary | ICD-10-CM

## 2014-08-21 DIAGNOSIS — E118 Type 2 diabetes mellitus with unspecified complications: Secondary | ICD-10-CM

## 2014-08-21 DIAGNOSIS — Z23 Encounter for immunization: Secondary | ICD-10-CM

## 2014-08-21 DIAGNOSIS — E11311 Type 2 diabetes mellitus with unspecified diabetic retinopathy with macular edema: Secondary | ICD-10-CM

## 2014-08-21 DIAGNOSIS — E1165 Type 2 diabetes mellitus with hyperglycemia: Secondary | ICD-10-CM

## 2014-08-21 DIAGNOSIS — J438 Other emphysema: Secondary | ICD-10-CM

## 2014-08-21 LAB — BASIC METABOLIC PANEL
BUN: 11 mg/dL (ref 6–23)
CHLORIDE: 103 meq/L (ref 96–112)
CO2: 28 mEq/L (ref 19–32)
Calcium: 9.4 mg/dL (ref 8.4–10.5)
Creatinine, Ser: 0.8 mg/dL (ref 0.4–1.2)
GFR: 82.13 mL/min (ref >60.00)
GLUCOSE: 117 mg/dL — AB (ref 70–99)
Potassium: 4 mEq/L (ref 3.5–5.1)
SODIUM: 136 meq/L (ref 135–145)

## 2014-08-21 LAB — HEMOGLOBIN A1C: Hgb A1c MFr Bld: 7.5 % — ABNORMAL HIGH (ref 4.6–6.5)

## 2014-08-21 IMAGING — CR DG CHEST 2V
2 series · 2 of 2 positions shown · non-contrast
Comparison: 10/02/2013

CLINICAL DATA: COPD, decreased breath sounds on the left

EXAM:
CHEST  2 VIEW

[w chest pa]
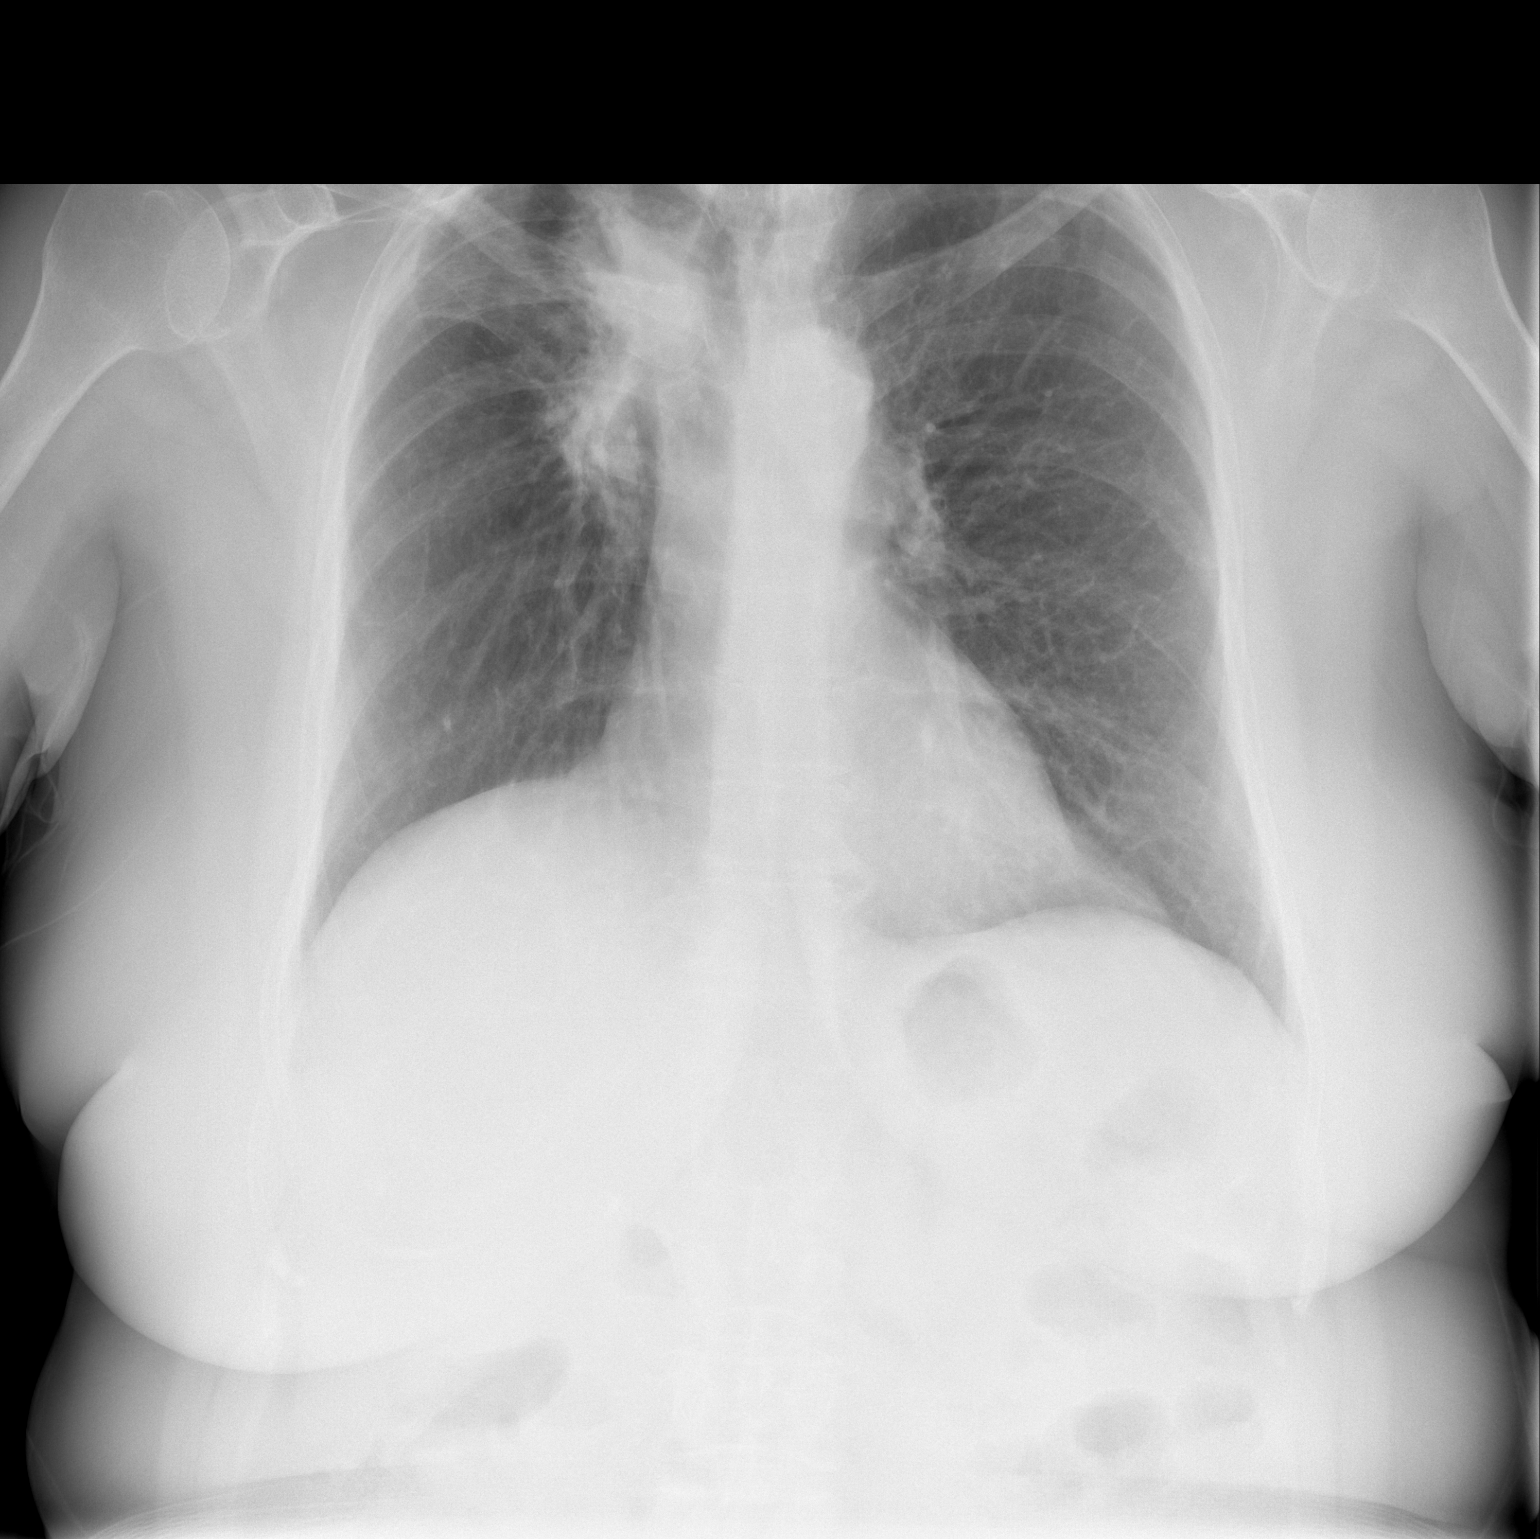

[w chest lat]
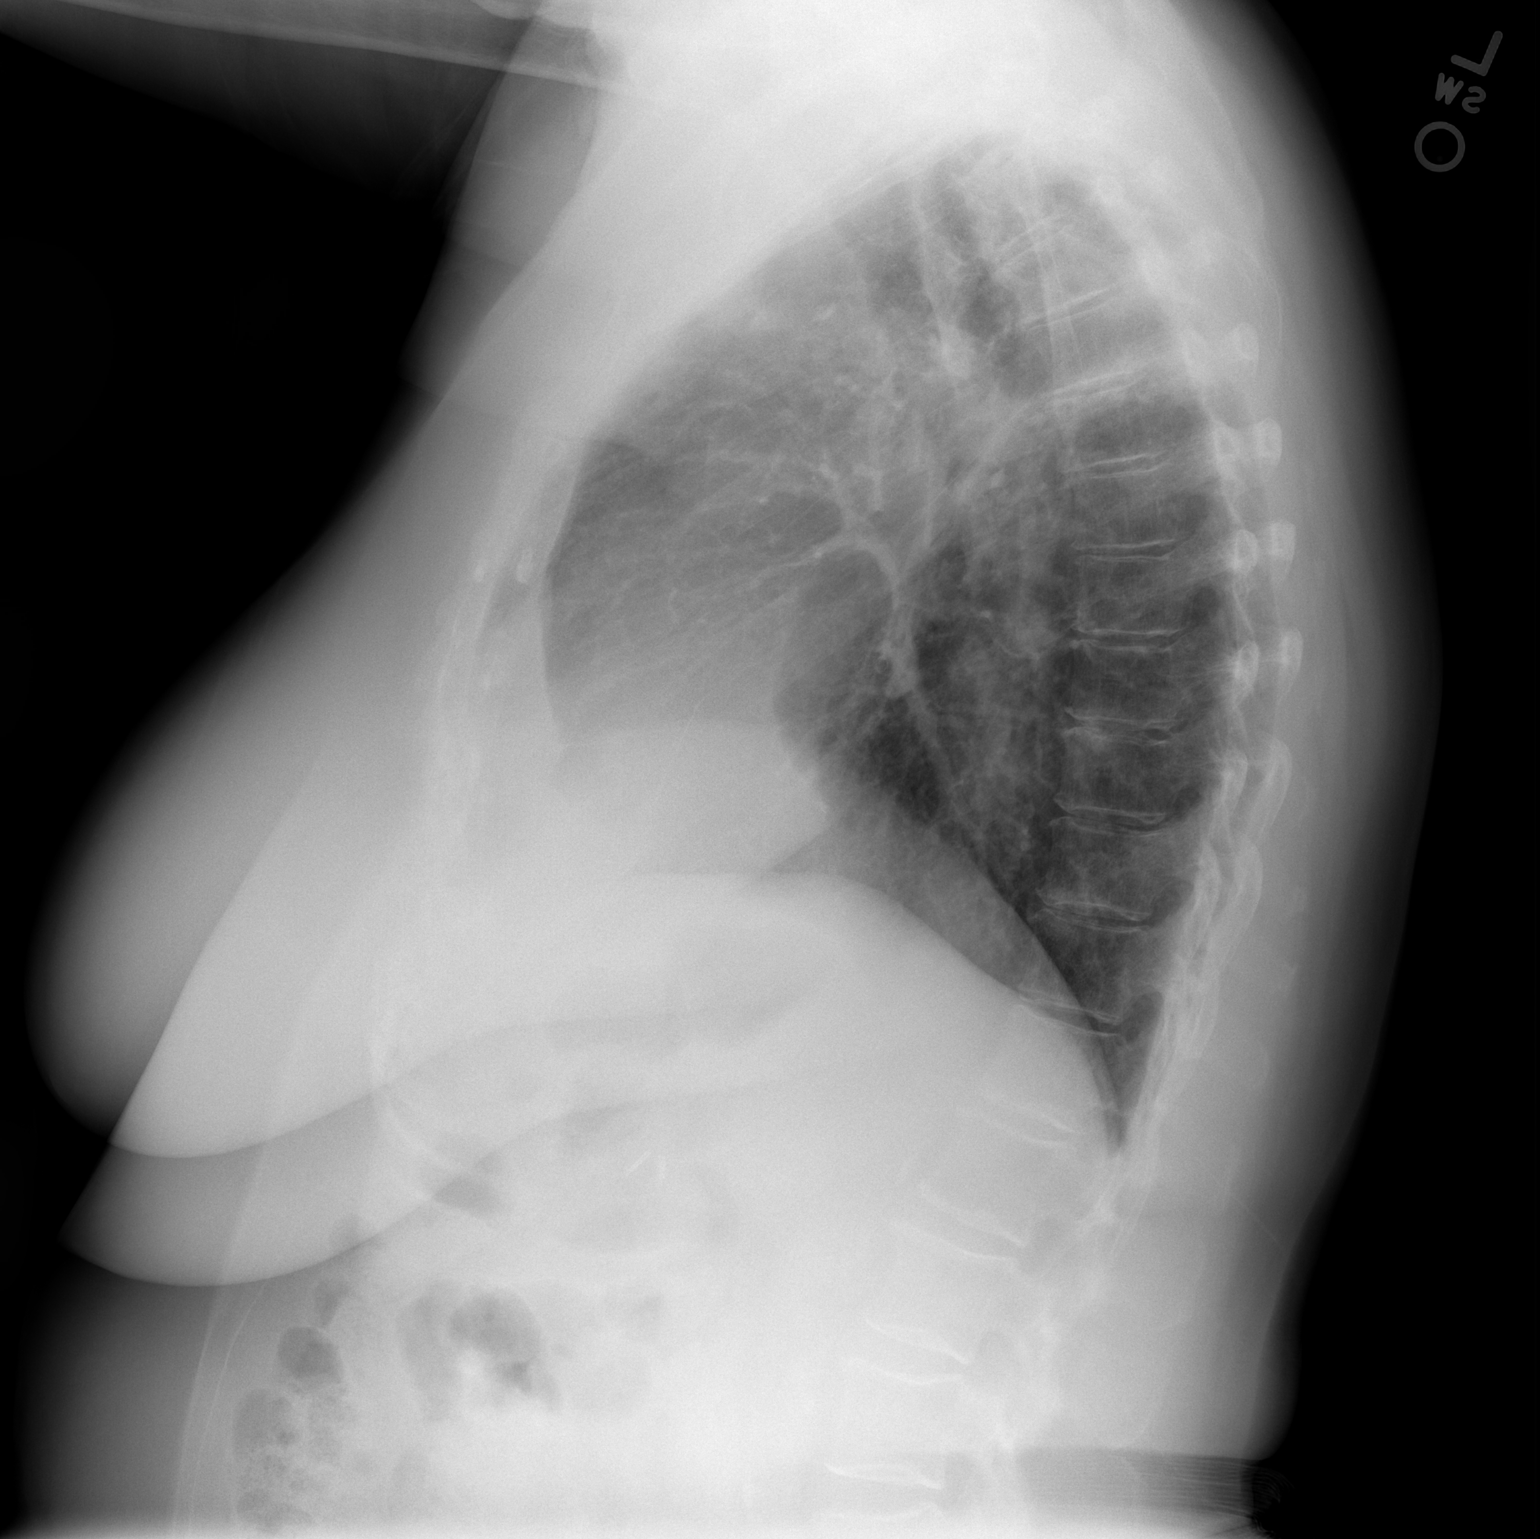

[2 of 2 positions shown; findings below may reference images not displayed]

FINDINGS: Cardiac shadow is stable. The left lung is clear. Residual changes
are noted in the right apex with volume loss and retraction. No new
focal abnormality is noted. No acute bony abnormality is seen.
IMPRESSION: No change from the prior exam.

## 2014-08-21 MED ORDER — ALBUTEROL SULFATE (2.5 MG/3ML) 0.083% IN NEBU: 2.5000 mg | INHALATION_SOLUTION | RESPIRATORY_TRACT | Status: DC | PRN

## 2014-08-21 MED ORDER — BUDESONIDE 1 MG/2ML IN SUSP: 1.0000 mg | Freq: Every day | RESPIRATORY_TRACT | Status: DC

## 2014-08-21 NOTE — Progress Notes (Signed)
Pre visit review using our clinic review tool, if applicable. No additional management support is needed unless otherwise documented below in the visit note. 

## 2014-08-21 NOTE — Progress Notes (Signed)
OFFICE NOTE  08/21/2014  CC:  Chief Complaint  Patient presents with  . Follow-up    DM  . Shortness of Breath   HPI: Patient is a 68 y.o. Caucasian female who is here for f/u DM 2. Due for HbA1c.  Home monitoring: avg fasting 140 or so, avg evening 2H PP 160s, with occ over 200 readings "when we eat out".  Compliant with meds.  Feels like breathing, cough is at baseline, yet she still takes albut neb 1-3 times per day.  No fevers.  No chest pains.   Pertinent PMH:  Past medical, surgical, social, and family history reviewed and no changes are noted since last office visit.  MEDS:  Outpatient Prescriptions Prior to Visit  Medication Sig Dispense Refill  . albuterol (PROVENTIL) (2.5 MG/3ML) 0.083% nebulizer solution Take 3 mLs (2.5 mg total) by nebulization every 4 (four) hours as needed for wheezing or shortness of breath.  75 mL  6  . aspirin 81 MG tablet Take 81 mg by mouth at bedtime.       . Blood Glucose Monitoring Suppl (ONE TOUCH ULTRA 2) W/DEVICE KIT 1 each by Other route daily.      . fluticasone (FLONASE) 50 MCG/ACT nasal spray Place 2 sprays into both nostrils at bedtime.  16 g  3  . Insulin Isophane & Regular Human (HUMULIN 70/30 KWIKPEN) (70-30) 100 UNIT/ML PEN 34 U SQ every morning before breakfast and 29 U every evening before supper  15 mL  3  . loratadine (CLARITIN) 10 MG tablet Take 10 mg by mouth at bedtime.       . metFORMIN (GLUCOPHAGE) 500 MG tablet Take 1 tablet (500 mg total) by mouth 2 (two) times daily with a meal.  60 tablet  6  . nitroGLYCERIN (NITROSTAT) 0.4 MG SL tablet Place 1 tablet (0.4 mg total) under the tongue every 5 (five) minutes as needed. For chest pain  50 tablet  3  . ONE TOUCH ULTRA TEST test strip 1 each by Other route daily.      Glory Rosebush DELICA LANCETS 41Y MISC 1 each by Other route daily.      . pantoprazole (PROTONIX) 40 MG tablet TAKE ONE TABLET BY MOUTH ONCE DAILY  30 tablet  11  . simvastatin (ZOCOR) 40 MG tablet Take 1 tablet  (40 mg total) by mouth daily.  90 tablet  3  . predniSONE (DELTASONE) 20 MG tablet 2 tabs po qd x 5d  10 tablet  0   No facility-administered medications prior to visit.    PE: Blood pressure 116/77, pulse 86, temperature 97.9 F (36.6 C), temperature source Oral, resp. rate 18, height _0  (1.575 m), weight 178 lb (80.74 kg), SpO2 93.00%. Gen: Alert, well appearing.  Patient is oriented to person, place, time, and situation. CV: RRR, no m/r/g.   LUNGS: CTA bilat, nonlabored resps, good aeration in all lung fields. EXT: no cyanosis or edema  LAB: none today Recent:  Lab Results  Component Value Date   HGBA1C 8.5* 05/13/2014     Chemistry      Component Value Date/Time   NA 139 02/10/2014 0919   NA 144 01/10/2014 0820   NA 141 02/16/2012 0803   K 3.6 02/10/2014 0919   K 3.9 01/10/2014 0820   K 3.9 02/16/2012 0803   CL 105 02/10/2014 0919   CL 105 02/18/2013 0812   CL 99 02/16/2012 0803   CO2 26 02/10/2014 0919   CO2 26  01/10/2014 0820   CO2 28 02/16/2012 0803   BUN 8 02/10/2014 0919   BUN 10.7 01/10/2014 0820   BUN 12 02/16/2012 0803   CREATININE 0.7 02/10/2014 0919   CREATININE 0.8 01/10/2014 0820   CREATININE 0.9 02/16/2012 0803      Component Value Date/Time   CALCIUM 8.9 02/10/2014 0919   CALCIUM 9.3 01/10/2014 0820   CALCIUM 8.7 02/16/2012 0803   ALKPHOS 101 02/10/2014 0919   ALKPHOS 126 01/10/2014 0820   ALKPHOS 75 02/16/2012 0803   AST 20 02/10/2014 0919   AST 14 01/10/2014 0820   AST 20 02/16/2012 0803   ALT 19 02/10/2014 0919   ALT 16 01/10/2014 0820   ALT 22 02/16/2012 0803   BILITOT 0.6 02/10/2014 0919   BILITOT 0.36 01/10/2014 0820   BILITOT 0.70 02/16/2012 0803       IMPRESSION AND PLAN:  1) DM 2, control gradually improving. Recheck HbA1c today.   BMET today.  2) COPD, not well controlled.  Not in acute exacerbation. Start pulmicort respules: 1 mg neb qd, 1 puff bid. RF'd albut neb sol'n and discussed correct use of this. Flu vaccine IM today.  An After Visit  Summary was printed and given to the patient.  FOLLOW UP: 4 mo

## 2014-08-25 ENCOUNTER — Telehealth: Payer: Self-pay | Admitting: Family Medicine

## 2014-08-25 NOTE — Telephone Encounter (Signed)
Pt now states that it is the steroid neb solution that costs $97. PLease advise if there is something else you can RX.

## 2014-08-25 NOTE — Telephone Encounter (Signed)
Pt's husband LMOM stating the albuterol nebulizer solution you prescribed for her at the last OV was $97.  They can not afford it, is there an alternative?  Please advise.

## 2014-08-25 NOTE — Telephone Encounter (Signed)
The only other option is to give the inhaled steroid via an inhaler. Pls ask pt if she knows how to use an inhaler. If yes, then pt needs to contact insurer and ask what inhaled steroid is on their drug formulary, then I'll do rx.-thx

## 2014-08-26 ENCOUNTER — Other Ambulatory Visit: Payer: Self-pay | Admitting: Family Medicine

## 2014-08-26 MED ORDER — FLUTICASONE PROPIONATE HFA 220 MCG/ACT IN AERO: 2.0000 | INHALATION_SPRAY | Freq: Two times a day (BID) | RESPIRATORY_TRACT | Status: DC

## 2014-08-26 NOTE — Telephone Encounter (Signed)
Pt aware.  She also asked me to repeat information to her daughter.  Daughter states she will contact insurance company to find out inhaled steroid on formulary.

## 2014-08-28 NOTE — Assessment & Plan Note (Deleted)
Start Advair. RF albuterol.

## 2014-08-28 NOTE — Assessment & Plan Note (Deleted)
Control improving. HbA1c today.

## 2014-08-29 ENCOUNTER — Ambulatory Visit: Payer: Medicare Other | Admitting: Family Medicine

## 2014-09-12 ENCOUNTER — Other Ambulatory Visit: Payer: Self-pay | Admitting: Internal Medicine

## 2014-09-18 IMAGING — CR DG CHEST 2V
2 series · 2 of 2 positions shown · non-contrast
Comparison: Two-view chest x-ray 10/18/2013.  CT chest 10/02/2013.

CLINICAL DATA: Chest and back pain.

EXAM:
CHEST  2 VIEW

[w chest pa]
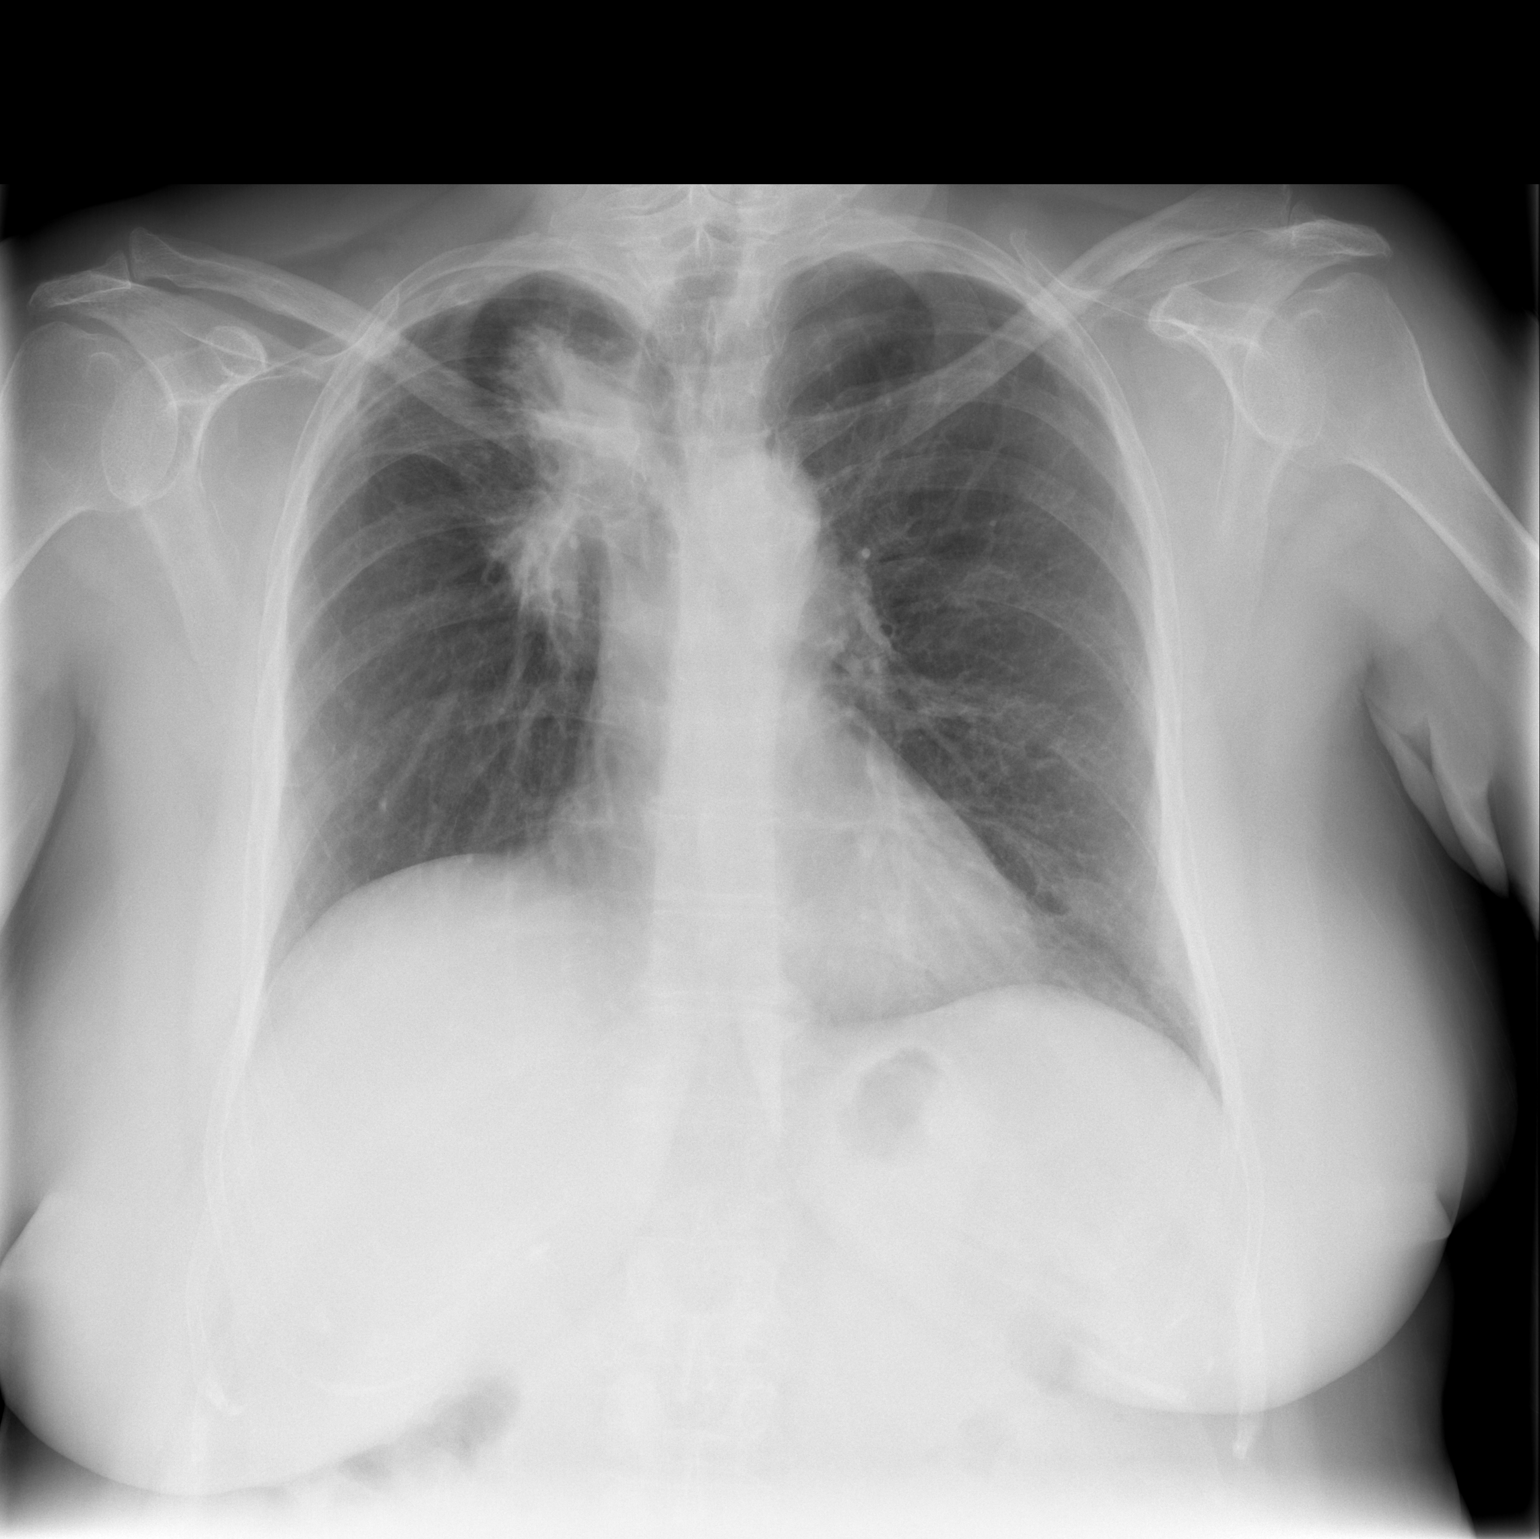

[w chest lat]
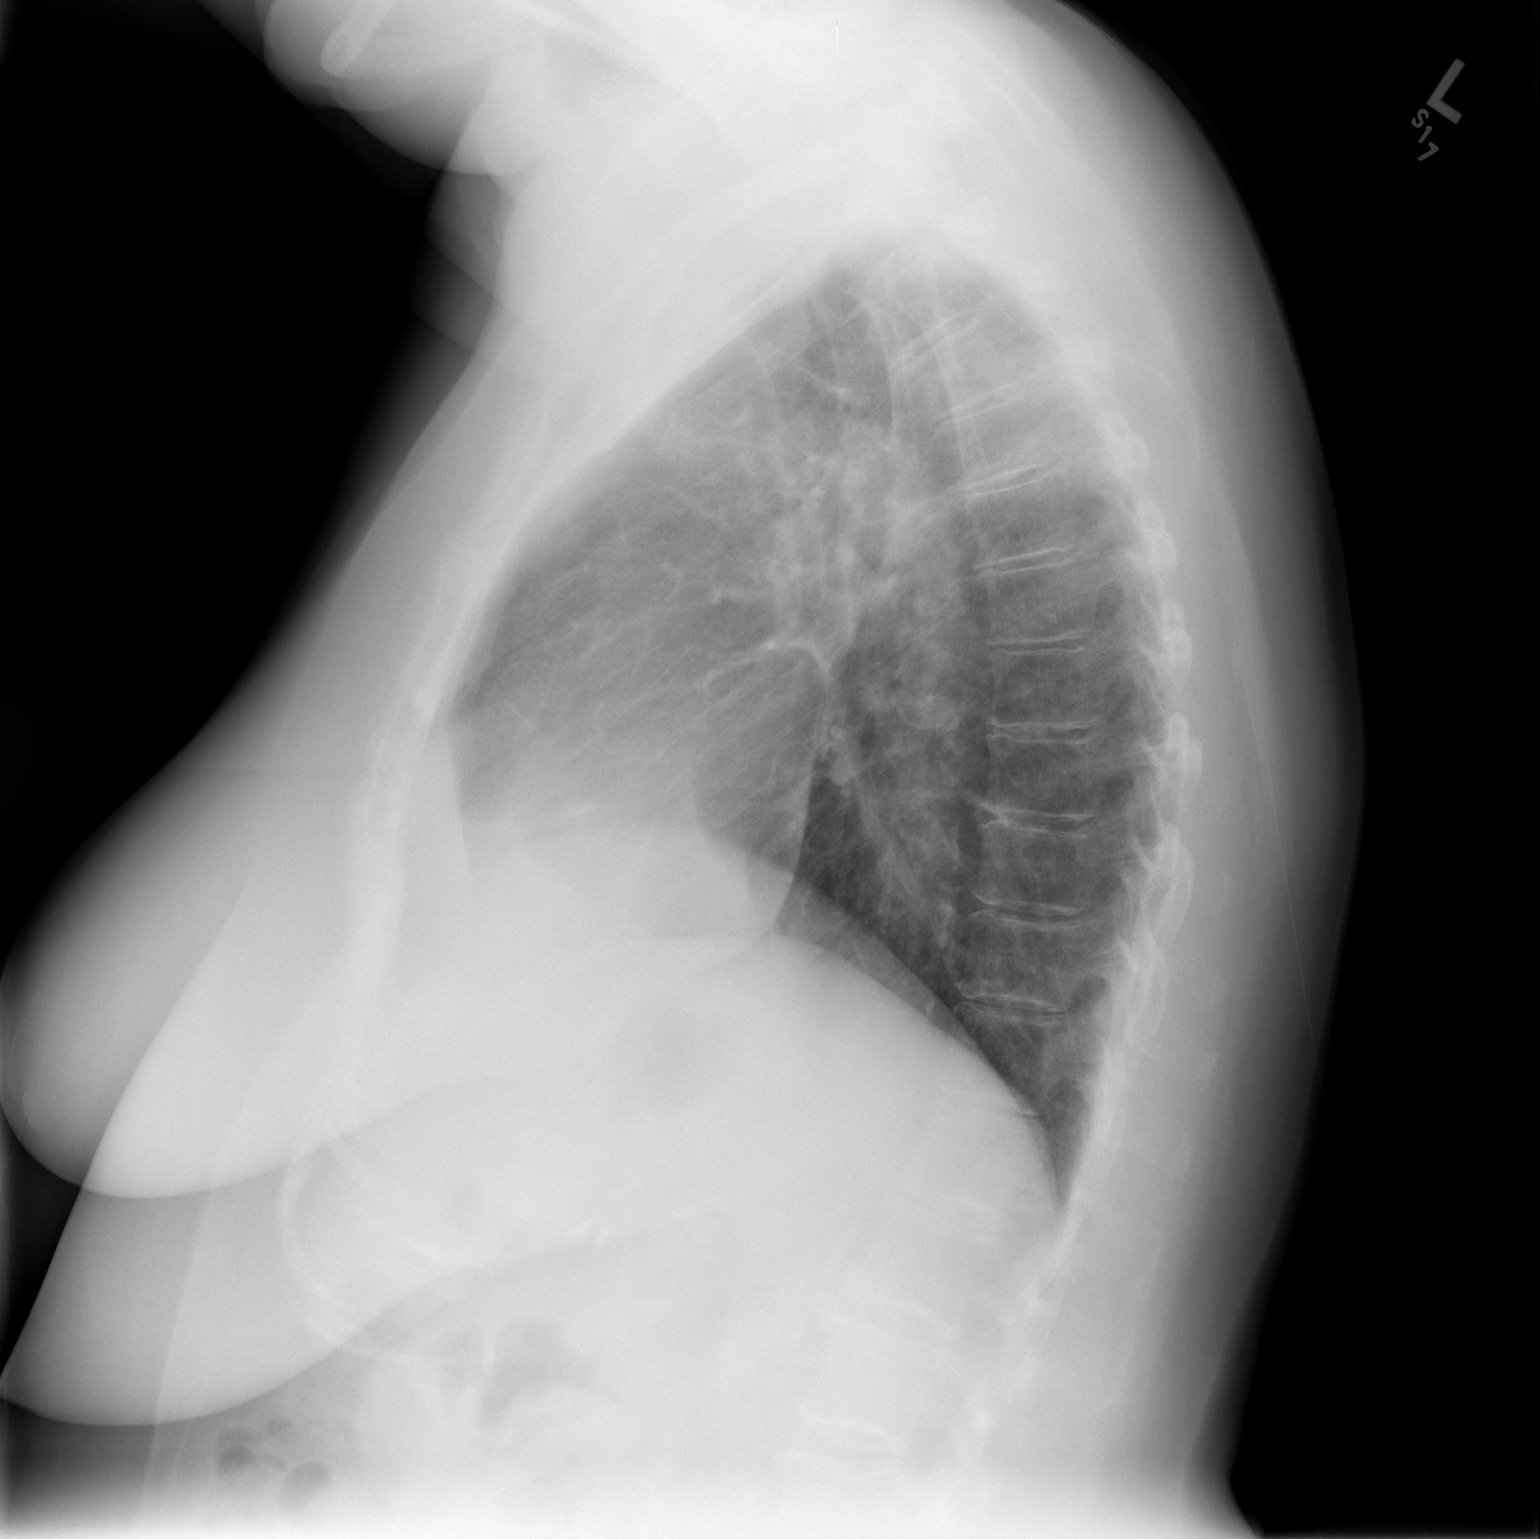

[2 of 2 positions shown; findings below may reference images not displayed]

FINDINGS: Post radiation scar tissue is again noted in the right upper lobe.
Heart size is normal. The lungs are otherwise clear. The visualized
soft tissues and bony thorax are unremarkable.
IMPRESSION: 1. No acute cardiopulmonary disease or significant interval change.
2. Stable radiation scarring in the right upper lobe.

## 2014-10-02 ENCOUNTER — Encounter (HOSPITAL_COMMUNITY): Payer: Self-pay | Admitting: Cardiovascular Disease

## 2014-10-24 DIAGNOSIS — H2513 Age-related nuclear cataract, bilateral: Secondary | ICD-10-CM

## 2014-10-24 DIAGNOSIS — H4010X0 Unspecified open-angle glaucoma, stage unspecified: Secondary | ICD-10-CM

## 2014-10-24 HISTORY — DX: Unspecified open-angle glaucoma, stage unspecified: H40.10X0

## 2014-10-24 HISTORY — DX: Age-related nuclear cataract, bilateral: H25.13

## 2014-10-30 DIAGNOSIS — H40023 Open angle with borderline findings, high risk, bilateral: Secondary | ICD-10-CM | POA: Diagnosis not present

## 2014-10-30 DIAGNOSIS — H25013 Cortical age-related cataract, bilateral: Secondary | ICD-10-CM | POA: Diagnosis not present

## 2014-10-31 ENCOUNTER — Other Ambulatory Visit: Payer: Self-pay | Admitting: Family Medicine

## 2014-10-31 MED ORDER — ALBUTEROL SULFATE (2.5 MG/3ML) 0.083% IN NEBU: 2.5000 mg | INHALATION_SOLUTION | RESPIRATORY_TRACT | Status: DC | PRN

## 2014-10-31 NOTE — Telephone Encounter (Signed)
Pt's husband requesting rf of albuterol neb solution.  Rx sent to [facility] x 3 rfs.

## 2014-11-05 ENCOUNTER — Encounter: Payer: Self-pay | Admitting: Family Medicine

## 2014-11-11 DIAGNOSIS — H2512 Age-related nuclear cataract, left eye: Secondary | ICD-10-CM | POA: Diagnosis not present

## 2014-11-11 DIAGNOSIS — H18411 Arcus senilis, right eye: Secondary | ICD-10-CM | POA: Diagnosis not present

## 2014-11-11 DIAGNOSIS — H2511 Age-related nuclear cataract, right eye: Secondary | ICD-10-CM | POA: Diagnosis not present

## 2014-11-11 DIAGNOSIS — E119 Type 2 diabetes mellitus without complications: Secondary | ICD-10-CM | POA: Diagnosis not present

## 2014-11-13 IMAGING — MR MR HEAD WO/W CM
10 of 13 series · 33 of 48 positions shown · IV contrast (multihance)
Comparison: MRI 07/14/2010

CLINICAL DATA: Restaging small cell lung cancer.

EXAM:
MRI HEAD WITHOUT AND WITH CONTRAST
TECHNIQUE: Multiplanar, multiecho pulse sequences of the brain and surrounding
structures were obtained without and with intravenous contrast.
CONTRAST:  16mL MULTIHANCE GADOBENATE DIMEGLUMINE 529 MG/ML IV SOLN

[Series 3: T1 · sagittal · 5.0mm · 0.47mm/px · 2 of 24 slices shown]
[im 1/24]
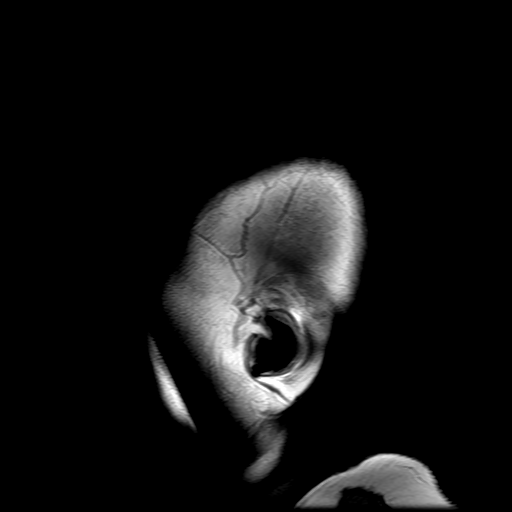
[im 12/24]
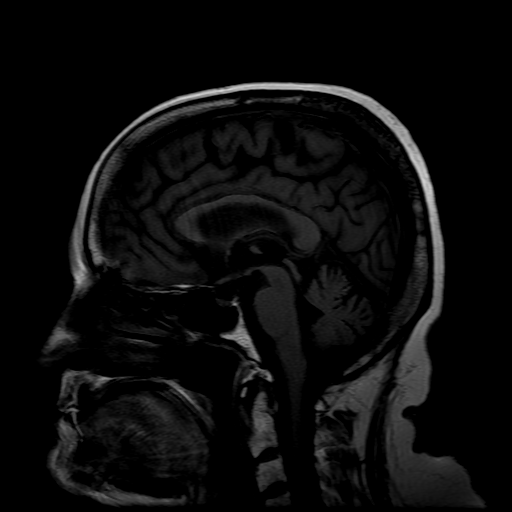

[Series 4: DWI · axial · 5.0mm · 1.09mm/px · z∈[-54,+91]mm · 7 of 60 slices shown (1 of 4)]
[im 1/60]
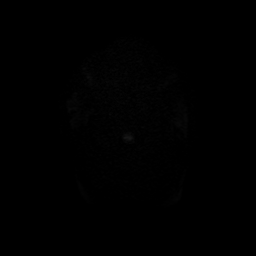
[im 10/60]
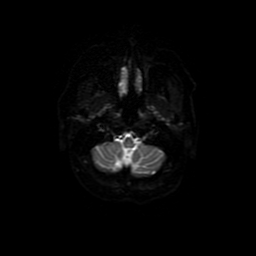
[im 20/60]
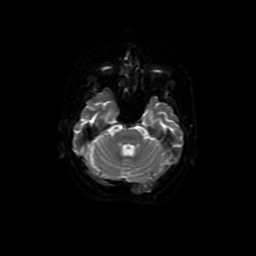
[im 30/60]
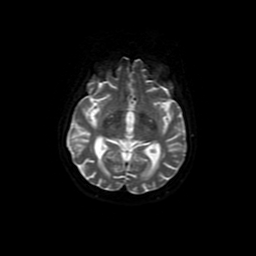
[im 40/60]
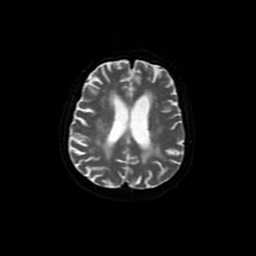
[im 50/60]
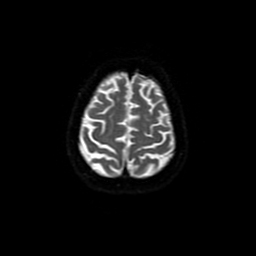
[im 60/60]
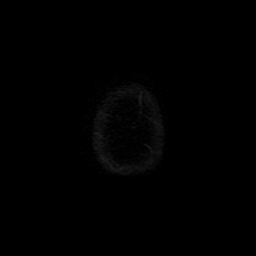

[Series 5: DWI · coronal · 5.0mm · 1.09mm/px · 7 of 60 slices shown (2 of 4)]
[im 1/60]
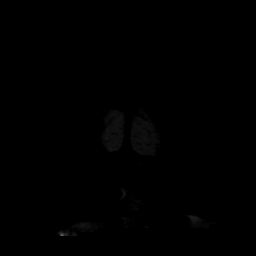
[im 10/60]
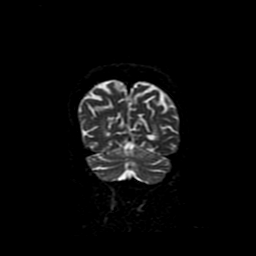
[im 20/60]
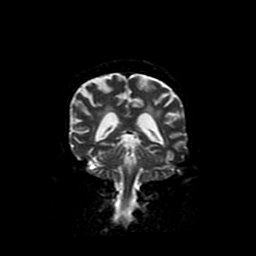
[im 30/60]
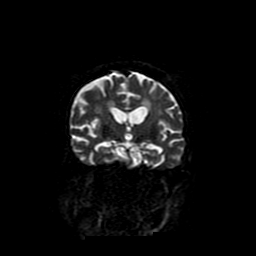
[im 40/60]
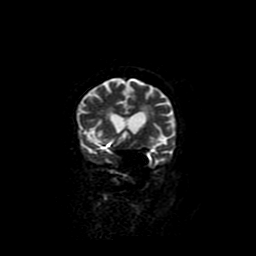
[im 50/60]
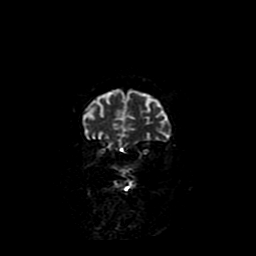
[im 60/60]
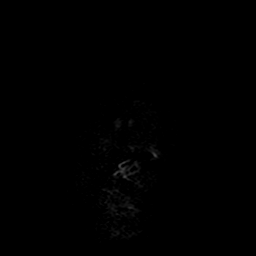

[Series 6: T2 · axial · 5.0mm · 0.43mm/px · z∈[-57,+92]mm · 3 of 24 slices shown]
[im 1/24]
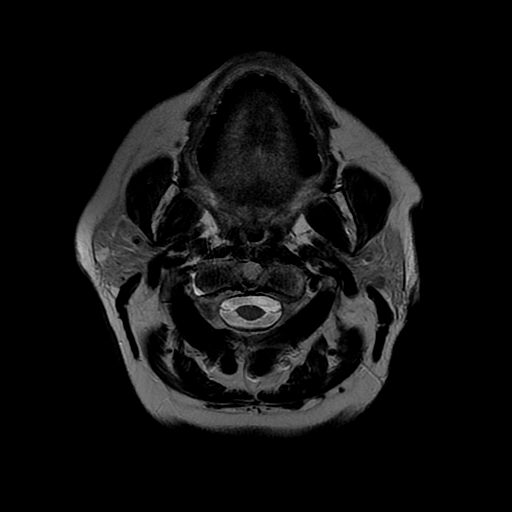
[im 12/24]
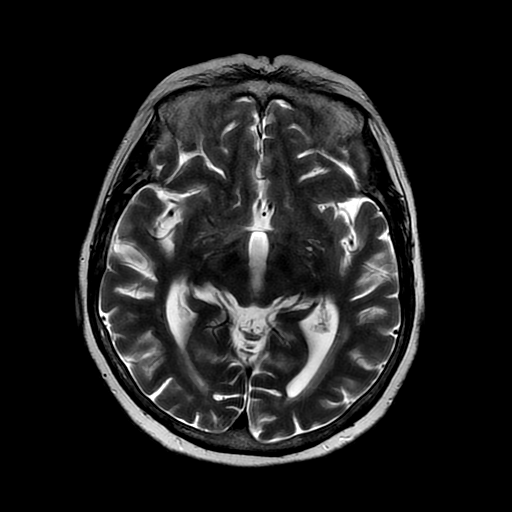
[im 24/24]
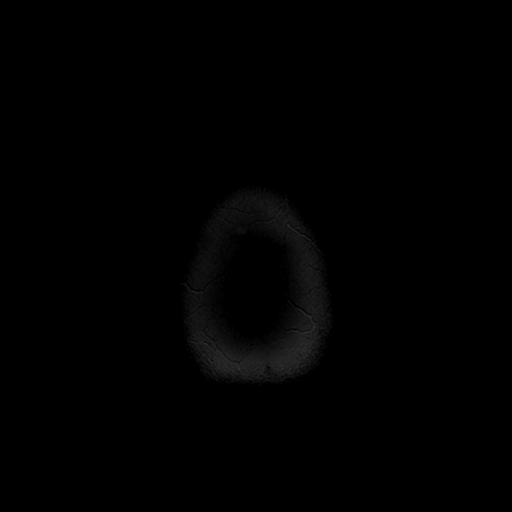

[Series 7: FLAIR · axial · 5.0mm · 0.43mm/px · z∈[-63,+98]mm · 2 of 24 slices shown]
[im 1/24]
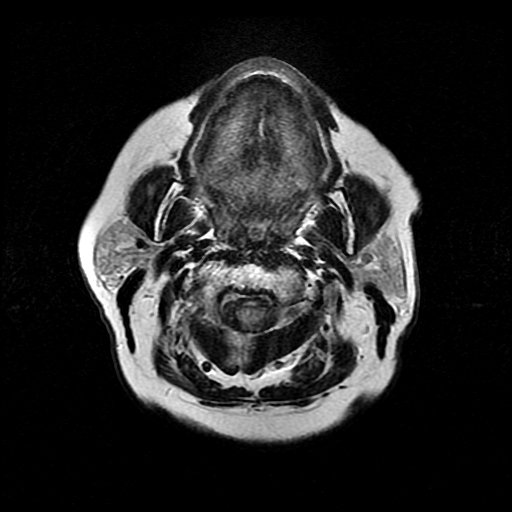
[im 24/24]
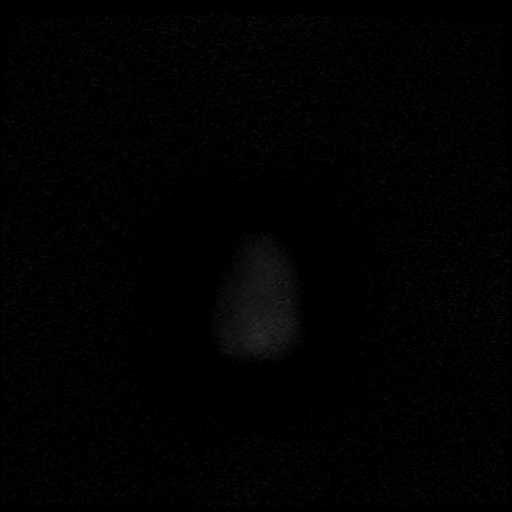

[Series 10: T2 post-contrast · coronal · 5.0mm · 0.45mm/px · 2 of 24 slices shown]
[im 1/24]
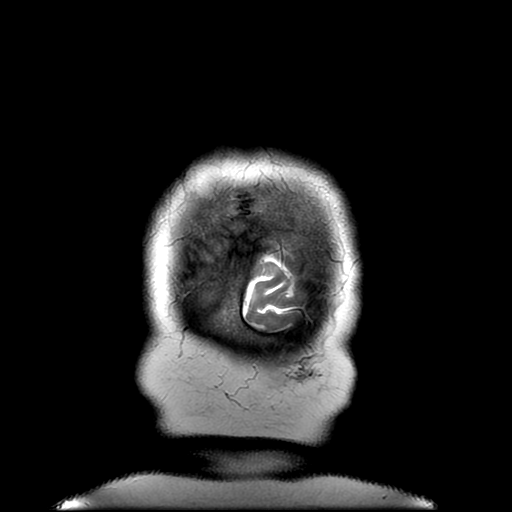
[im 24/24]
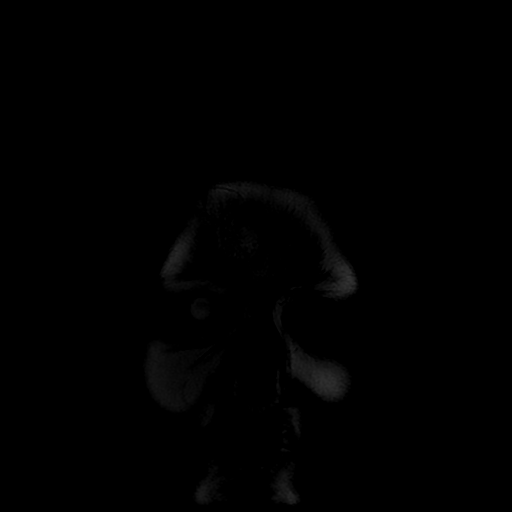

[Series 12: T1 post-contrast · coronal · 5.0mm · 0.45mm/px · 2 of 24 slices shown (1 of 2)]
[im 1/24]
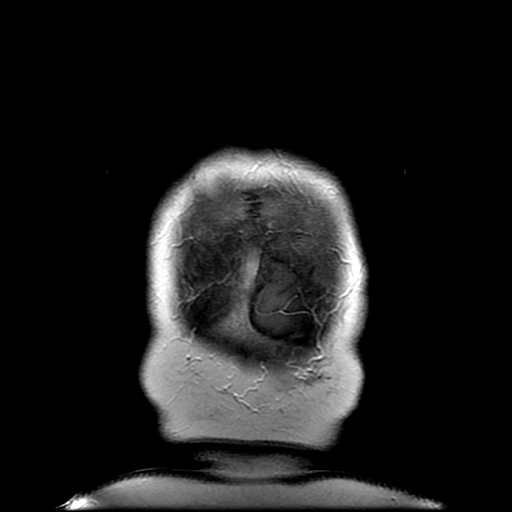
[im 24/24]
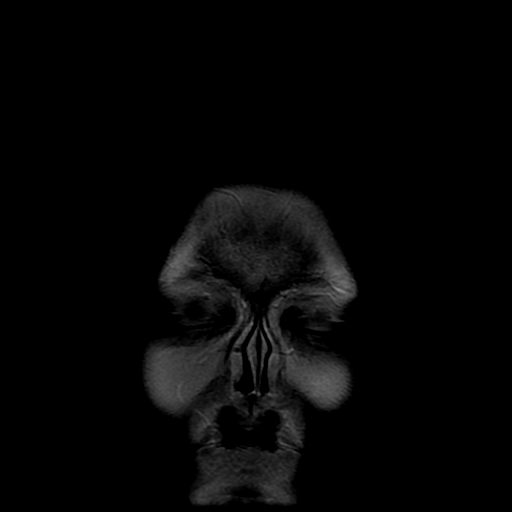

[Series 13: T1 post-contrast · sagittal · 5.0mm · 0.47mm/px · 2 of 24 slices shown (2 of 2)]
[im 1/24]
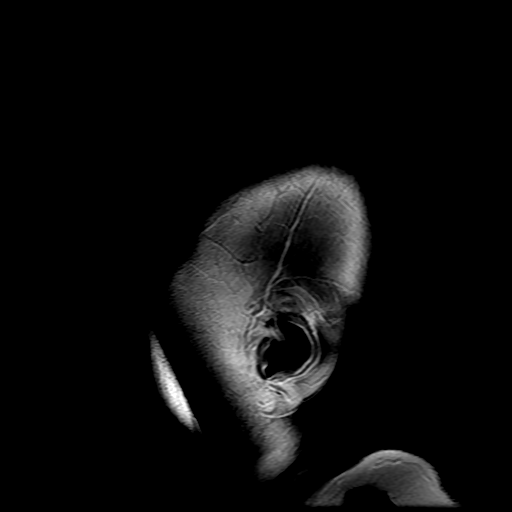
[im 24/24]
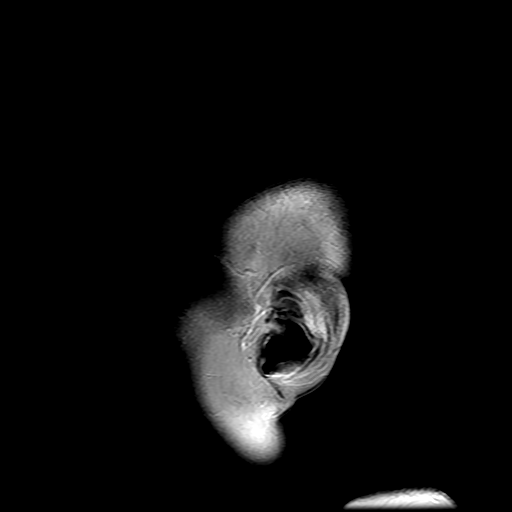

[Series 400: DWI · axial · 5.0mm · 1.09mm/px · z∈[-54,+91]mm · 3 of 30 slices shown (3 of 4)]
[im 1/30]
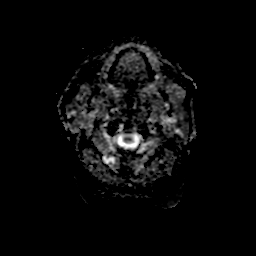
[im 15/30]
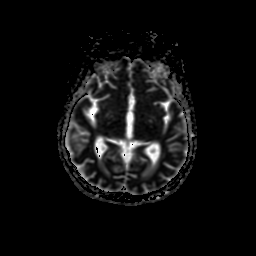
[im 30/30]
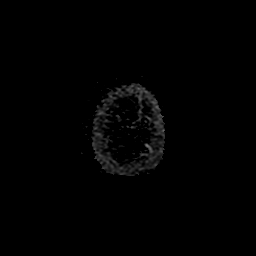

[Series 500: DWI · coronal · 5.0mm · 1.09mm/px · 3 of 30 slices shown (4 of 4)]
[im 1/30]
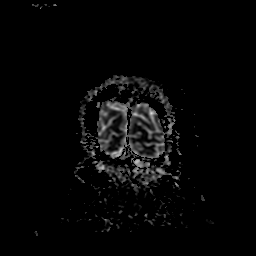
[im 15/30]
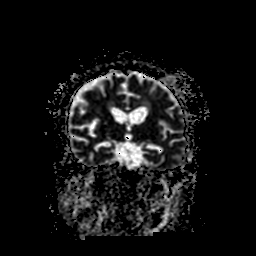
[im 30/30]
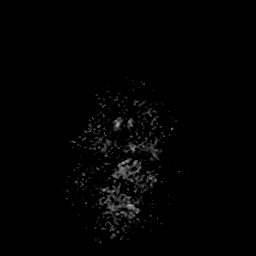

[33 of 48 positions shown; findings below may reference images not displayed]

FINDINGS: Generalized atrophy is similar to the prior study. Negative for
hydrocephalus.

Patchy hyperintensity throughout the cerebral white matter has
progressed since 5888. This is most likely due to chemo/ radiation.
Brainstem and cerebellum have normal signal.

Negative for acute infarct.  Negative for hemorrhage.

Postcontrast imaging reveals normal enhancement. No enhancing mass
lesion. Leptomeningeal enhancement is normal.

No edema or shift to the midline structures.

Paranasal sinuses are clear aside from minimal mucosal thickening in
the left maxillary sinus.
IMPRESSION: Negative for metastatic disease.

Progression of white matter disease since 5888 likely related to
chronic microvascular ischemia or chemo/radiation.

## 2014-11-20 ENCOUNTER — Other Ambulatory Visit: Payer: Self-pay | Admitting: Ophthalmology

## 2014-11-20 DIAGNOSIS — H53483 Generalized contraction of visual field, bilateral: Secondary | ICD-10-CM

## 2014-11-21 DIAGNOSIS — J440 Chronic obstructive pulmonary disease with acute lower respiratory infection: Secondary | ICD-10-CM | POA: Diagnosis not present

## 2014-12-02 ENCOUNTER — Other Ambulatory Visit: Payer: Self-pay

## 2014-12-04 ENCOUNTER — Encounter: Payer: Self-pay | Admitting: Family Medicine

## 2014-12-04 ENCOUNTER — Other Ambulatory Visit: Payer: Self-pay | Admitting: Family Medicine

## 2014-12-04 ENCOUNTER — Ambulatory Visit (INDEPENDENT_AMBULATORY_CARE_PROVIDER_SITE_OTHER): Payer: Medicare Other | Admitting: Family Medicine

## 2014-12-04 VITALS — BP 115/72 | HR 87 | Temp 97.2°F | Resp 20 | Ht 62.0 in | Wt 179.0 lb

## 2014-12-04 DIAGNOSIS — Z1231 Encounter for screening mammogram for malignant neoplasm of breast: Secondary | ICD-10-CM

## 2014-12-04 DIAGNOSIS — E782 Mixed hyperlipidemia: Secondary | ICD-10-CM

## 2014-12-04 DIAGNOSIS — E118 Type 2 diabetes mellitus with unspecified complications: Secondary | ICD-10-CM

## 2014-12-04 DIAGNOSIS — J439 Emphysema, unspecified: Secondary | ICD-10-CM

## 2014-12-04 DIAGNOSIS — E1165 Type 2 diabetes mellitus with hyperglycemia: Secondary | ICD-10-CM

## 2014-12-04 DIAGNOSIS — IMO0002 Reserved for concepts with insufficient information to code with codable children: Secondary | ICD-10-CM

## 2014-12-04 NOTE — Progress Notes (Signed)
Pre visit review using our clinic review tool, if applicable. No additional management support is needed unless otherwise documented below in the visit note. 

## 2014-12-04 NOTE — Patient Instructions (Signed)
Increase your suppertime NPH insulin dose to 32 Units every day. Continue 34 Units NPH every morning.  Take your Flovent inhaler every day no matter how you feel (2 puffs in the morning and 2 puffs in the evening).  Use your albuterol in your nebulizer machine as often as every 4 hours WHEN NEEDED for wheezing/dry cough/shortness of breath.  We will do FASTING labs at your next follow up visit in 3 months, so DON'T TAKE your NPH insulin the evening before your appointment.

## 2014-12-04 NOTE — Progress Notes (Signed)
OFFICE NOTE  12/04/2014  CC:  Chief Complaint  Patient presents with  . Follow-up   HPI: Patient is a 69 y.o. Caucasian female who is here for 3 mo f/u DM 2, COPD, hyperlipidemia. Pt not fasting today. Last visit I placed her on flovent HFA due to poorly controlled copd.  She is taking the flovent incorrectly-"only when I think I need it".  Sounds like she is taking albut neb less often than last visit (not daily).  Avg fasting gluc 130-140 range, pre supper avg 150 or so, but some erratic numbers at this time when she cheats on diet. Compliant with insulins. Taking statin daily, denies side effects.  No acute complaints.  She will be getting cataract surgery on left eye 12/22/14.  Pertinent PMH:  Past medical, surgical, social, and family history reviewed and no changes are noted since last office visit.  MEDS:  Outpatient Prescriptions Prior to Visit  Medication Sig Dispense Refill  . albuterol (PROVENTIL) (2.5 MG/3ML) 0.083% nebulizer solution Take 3 mLs (2.5 mg total) by nebulization every 4 (four) hours as needed for wheezing or shortness of breath. 75 mL 3  . aspirin 81 MG tablet Take 81 mg by mouth at bedtime.     . Blood Glucose Monitoring Suppl (ONE TOUCH ULTRA 2) W/DEVICE KIT 1 each by Other route daily.    . fluticasone (FLONASE) 50 MCG/ACT nasal spray Place 2 sprays into both nostrils at bedtime. 16 g 3  . fluticasone (FLOVENT HFA) 220 MCG/ACT inhaler Inhale 2 puffs into the lungs 2 (two) times daily. 1 Inhaler 12  . Insulin Isophane & Regular Human (HUMULIN 70/30 KWIKPEN) (70-30) 100 UNIT/ML PEN 34 U SQ every morning before breakfast and 29 U every evening before supper 15 mL 3  . loratadine (CLARITIN) 10 MG tablet Take 10 mg by mouth at bedtime.     . metFORMIN (GLUCOPHAGE) 500 MG tablet Take 1 tablet (500 mg total) by mouth 2 (two) times daily with a meal. 60 tablet 6  . nitroGLYCERIN (NITROSTAT) 0.4 MG SL tablet Place 1 tablet (0.4 mg total) under the tongue every 5  (five) minutes as needed. For chest pain 50 tablet 3  . ONE TOUCH ULTRA TEST test strip TEST 3 TIMES A DAY 100 each 11  . ONETOUCH DELICA LANCETS 51V MISC TEST 3 TIMES A DAY 100 each 11  . pantoprazole (PROTONIX) 40 MG tablet TAKE ONE TABLET BY MOUTH ONCE DAILY 30 tablet 11  . simvastatin (ZOCOR) 40 MG tablet Take 1 tablet (40 mg total) by mouth daily. 90 tablet 3   No facility-administered medications prior to visit.    PE: Blood pressure 115/72, pulse 87, temperature 97.2 F (36.2 C), temperature source Temporal, resp. rate 20, height $RemoveBe'5\' 2"'LTdZYcaDR$  (1.575 m), weight 179 lb (81.194 kg), SpO2 96 %. Gen: Alert, well appearing.  Patient is oriented to person, place, time, and situation. Neck: supple/nontender.  No LAD, mass, or TM.  Carotid pulses 2+ bilaterally, without bruits. CV: RRR, no m/r/g.  S1 and S2 distant. LUNGS: CTA bilat, nonlabored resps, diminished BS throughout all lung fields (mainly in upper 1/2 of both lungs).  LAB: none today RECENT: Lab Results  Component Value Date   HGBA1C 7.5* 08/21/2014   Lab Results  Component Value Date   CHOL 154 02/10/2014   HDL 27.00* 02/10/2014   LDLCALC 78 02/10/2014   LDLDIRECT 109.8 07/06/2012   TRIG 246.0* 02/10/2014   CHOLHDL 6 02/10/2014   .lastchem  IMPRESSION AND PLAN:  1) DM 2.  Control fair.   Increase your suppertime NPH insulin dose to 32 Units every day. Continue 34 Units NPH every morning. Plan on HbA1c at next f/u in 3 mo.  2) COPD; she's not taking flovent as rx'd. Take your Flovent inhaler every day no matter how you feel (2 puffs in the morning and 2 puffs in the evening). Use your albuterol in your nebulizer machine as often as every 4 hours WHEN NEEDED for wheezing/dry cough  3) Hyperlipidemia, mixed: tolerating statin.  Last LDL was 78 in April 2015, HDL low and trigs high at that time.  Transaminases normal at that time as well. Repeat FLP at next f/u visit in 3 mo.  An After Visit Summary was printed and  given to the patient.  FOLLOW UP: 3 mo, fasting

## 2014-12-05 ENCOUNTER — Ambulatory Visit: Payer: Medicare Other | Admitting: Family Medicine

## 2014-12-19 ENCOUNTER — Telehealth: Payer: Self-pay | Admitting: Family Medicine

## 2014-12-19 MED ORDER — METFORMIN HCL 500 MG PO TABS: 500.0000 mg | ORAL_TABLET | Freq: Two times a day (BID) | ORAL | Status: DC

## 2014-12-19 NOTE — Telephone Encounter (Signed)
rx sent

## 2014-12-19 NOTE — Telephone Encounter (Signed)
[facility] called for a refill order to get Metformin to pt at  [facility].

## 2014-12-21 DIAGNOSIS — J440 Chronic obstructive pulmonary disease with acute lower respiratory infection: Secondary | ICD-10-CM | POA: Diagnosis not present

## 2014-12-22 DIAGNOSIS — H2512 Age-related nuclear cataract, left eye: Secondary | ICD-10-CM | POA: Diagnosis not present

## 2014-12-22 DIAGNOSIS — H2513 Age-related nuclear cataract, bilateral: Secondary | ICD-10-CM | POA: Diagnosis not present

## 2014-12-22 DIAGNOSIS — H25812 Combined forms of age-related cataract, left eye: Secondary | ICD-10-CM | POA: Diagnosis not present

## 2014-12-23 ENCOUNTER — Telehealth: Payer: Self-pay | Admitting: Medical Oncology

## 2014-12-23 ENCOUNTER — Telehealth: Payer: Self-pay | Admitting: Internal Medicine

## 2014-12-23 DIAGNOSIS — H2511 Age-related nuclear cataract, right eye: Secondary | ICD-10-CM | POA: Diagnosis not present

## 2014-12-23 NOTE — Telephone Encounter (Signed)
returned call adn confirmd lab moved to before ct per pt request....pt ok and aware

## 2014-12-23 NOTE — Telephone Encounter (Signed)
Labs r/s to same day as Ct

## 2015-01-02 ENCOUNTER — Ambulatory Visit (HOSPITAL_COMMUNITY)
Admission: RE | Admit: 2015-01-02 | Discharge: 2015-01-02 | Disposition: A | Discharge status: Home / Self Care | Payer: Medicare Other | Source: Ambulatory Visit | Attending: Family Medicine | Admitting: Family Medicine

## 2015-01-02 DIAGNOSIS — Z1231 Encounter for screening mammogram for malignant neoplasm of breast: Secondary | ICD-10-CM | POA: Insufficient documentation

## 2015-01-12 DIAGNOSIS — H2511 Age-related nuclear cataract, right eye: Secondary | ICD-10-CM | POA: Diagnosis not present

## 2015-01-12 DIAGNOSIS — H25811 Combined forms of age-related cataract, right eye: Secondary | ICD-10-CM | POA: Diagnosis not present

## 2015-01-12 DIAGNOSIS — H2513 Age-related nuclear cataract, bilateral: Secondary | ICD-10-CM | POA: Diagnosis not present

## 2015-01-13 ENCOUNTER — Ambulatory Visit (HOSPITAL_COMMUNITY): Payer: Medicare Other

## 2015-01-13 ENCOUNTER — Other Ambulatory Visit: Payer: Medicare Other

## 2015-01-15 ENCOUNTER — Encounter (HOSPITAL_COMMUNITY): Payer: Self-pay

## 2015-01-15 ENCOUNTER — Other Ambulatory Visit (HOSPITAL_BASED_OUTPATIENT_CLINIC_OR_DEPARTMENT_OTHER): Payer: Medicare Other

## 2015-01-15 ENCOUNTER — Ambulatory Visit (HOSPITAL_COMMUNITY)
Admission: RE | Admit: 2015-01-15 | Discharge: 2015-01-15 | Disposition: A | Discharge status: Home / Self Care | Payer: Medicare Other | Source: Ambulatory Visit | Attending: Internal Medicine | Admitting: Internal Medicine

## 2015-01-15 DIAGNOSIS — C3490 Malignant neoplasm of unspecified part of unspecified bronchus or lung: Secondary | ICD-10-CM

## 2015-01-15 DIAGNOSIS — C3411 Malignant neoplasm of upper lobe, right bronchus or lung: Secondary | ICD-10-CM | POA: Diagnosis not present

## 2015-01-15 DIAGNOSIS — Z85118 Personal history of other malignant neoplasm of bronchus and lung: Secondary | ICD-10-CM

## 2015-01-15 DIAGNOSIS — I2510 Atherosclerotic heart disease of native coronary artery without angina pectoris: Secondary | ICD-10-CM | POA: Diagnosis not present

## 2015-01-15 LAB — COMPREHENSIVE METABOLIC PANEL (CC13)
ALBUMIN: 3.7 g/dL (ref 3.5–5.0)
ALT: 20 U/L (ref 0–55)
AST: 19 U/L (ref 5–34)
Alkaline Phosphatase: 92 U/L (ref 40–150)
Anion Gap: 8 mEq/L (ref 3–11)
BUN: 9 mg/dL (ref 7.0–26.0)
CHLORIDE: 108 meq/L (ref 98–109)
CO2: 25 meq/L (ref 22–29)
Calcium: 9.2 mg/dL (ref 8.4–10.4)
Creatinine: 0.7 mg/dL (ref 0.6–1.1)
EGFR: 85 mL/min/{1.73_m2} — AB (ref >90)
Glucose: 99 mg/dl (ref 70–140)
POTASSIUM: 4 meq/L (ref 3.5–5.1)
SODIUM: 141 meq/L (ref 136–145)
TOTAL PROTEIN: 7.1 g/dL (ref 6.4–8.3)
Total Bilirubin: 0.34 mg/dL (ref 0.20–1.20)

## 2015-01-15 LAB — CBC WITH DIFFERENTIAL/PLATELET
BASO%: 0.3 % (ref 0.0–2.0)
BASOS ABS: 0 10*3/uL (ref 0.0–0.1)
EOS ABS: 0.2 10*3/uL (ref 0.0–0.5)
EOS%: 3.7 % (ref 0.0–7.0)
HCT: 42 % (ref 34.8–46.6)
HEMOGLOBIN: 14 g/dL (ref 11.6–15.9)
LYMPH%: 24.6 % (ref 14.0–49.7)
MCH: 29.5 pg (ref 25.1–34.0)
MCHC: 33.4 g/dL (ref 31.5–36.0)
MCV: 88.5 fL (ref 79.5–101.0)
MONO#: 0.6 10*3/uL (ref 0.1–0.9)
MONO%: 10 % (ref 0.0–14.0)
NEUT%: 61.4 % (ref 38.4–76.8)
NEUTROS ABS: 3.6 10*3/uL (ref 1.5–6.5)
PLATELETS: 196 10*3/uL (ref 145–400)
RBC: 4.74 10*6/uL (ref 3.70–5.45)
RDW: 15.3 % — ABNORMAL HIGH (ref 11.2–14.5)
WBC: 5.8 10*3/uL (ref 3.9–10.3)
lymph#: 1.4 10*3/uL (ref 0.9–3.3)

## 2015-01-20 ENCOUNTER — Ambulatory Visit (HOSPITAL_BASED_OUTPATIENT_CLINIC_OR_DEPARTMENT_OTHER): Payer: Medicare Other | Admitting: Internal Medicine

## 2015-01-20 ENCOUNTER — Encounter: Payer: Self-pay | Admitting: Internal Medicine

## 2015-01-20 VITALS — BP 115/67 | HR 95 | Temp 97.6°F | Resp 18 | Ht 62.0 in | Wt 178.7 lb

## 2015-01-20 DIAGNOSIS — Z85118 Personal history of other malignant neoplasm of bronchus and lung: Secondary | ICD-10-CM

## 2015-01-20 DIAGNOSIS — J440 Chronic obstructive pulmonary disease with acute lower respiratory infection: Secondary | ICD-10-CM | POA: Diagnosis not present

## 2015-01-20 DIAGNOSIS — C3490 Malignant neoplasm of unspecified part of unspecified bronchus or lung: Secondary | ICD-10-CM

## 2015-01-20 NOTE — Progress Notes (Signed)
[place] [phone]   [phone]  OFFICE PROGRESS NOTE  [name], MD [address] [place] [address]  PRINCIPAL DIAGNOSIS: Limited stage small cell lung cancer diagnosed in March of 2010.   PRIOR THERAPY:  1. Status post 4 cycles of systemic chemotherapy with carboplatin and etoposide concurrent with radiation during cycle 2 and 3. The last dose of chemotherapy was given April 01, 2009. 2. Status post prophylactic cranial irradiation under the care of Dr. Lisbeth Renshaw completed May 19, 2009.  CURRENT THERAPY: Observation.  INTERVAL HISTORY: [name] 69 y.o. female returns to the clinic today for routine annual followup visit accompanied by her daughter. The patient is feeling fine today with no specific complaints except for shortness breath with exertion. The patient has been observation for the last 6 years with no evidence for disease recurrence. She is currently seen by Dr. Anitra Lauth at [facility]. The patient denied having any significant chest pain, cough or hemoptysis. She had repeat CT scan of the chest as well as MRI of the brain performed recently and she is here for evaluation and discussion of her scan results.  MEDICAL HISTORY: Past Medical History  Diagnosis Date  . Diabetes mellitus   . GERD (gastroesophageal reflux disease)   . COPD (chronic obstructive pulmonary disease)   . History of diverticulitis of colon   . CAD (coronary artery disease)     NON obstructive. Cath 2006 preserved LV fxn, scattered irregularities without critical stenosis. 2008 stress echo negative for ischemia, but with hypertensive response  . Hyperlipidemia   . Mild cognitive impairment with memory loss     Likely from brain radiation therapy  . Bilateral diabetic retinopathy 2015    Background  . Age-related nuclear cataract of both eyes 2016    +cortical age related cataracts OU  . 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 Lung cancer       non-small cell lung ca, stage III in 05/2011, s/p chemo/radiation    ALLERGIES:  is allergic to lactose intolerance (gi) and ibuprofen.  MEDICATIONS:  Current Outpatient Prescriptions  Medication Sig Dispense Refill  . albuterol (PROVENTIL) (2.5 MG/3ML) 0.083% nebulizer solution Take 3 mLs (2.5 mg total) by nebulization every 4 (four) hours as needed for wheezing or shortness of breath. 75 mL 3  . ALPHAGAN P 0.1 % SOLN     . aspirin 81 MG tablet Take 81 mg by mouth at bedtime.     Marland Kitchen BESIVANCE 0.6 % SUSP     . Blood Glucose Monitoring Suppl (ONE TOUCH ULTRA 2) W/DEVICE KIT 1 each by Other route daily.    . dorzolamide (TRUSOPT) 2 % ophthalmic solution     . DUREZOL 0.05 % EMUL     . fluticasone (FLONASE) 50 MCG/ACT nasal spray Place 2 sprays into both nostrils at bedtime. 16 g 3  . fluticasone (FLOVENT HFA) 220 MCG/ACT inhaler Inhale 2 puffs into the lungs 2 (two) times daily. 1 Inhaler 12  . ILEVRO 0.3 % SUSP     . Insulin Isophane & Regular Human (HUMULIN 70/30 KWIKPEN) (70-30) 100 UNIT/ML PEN 34 U SQ every morning before breakfast and 29 U every evening before supper 15 mL 3  . loratadine (CLARITIN) 10 MG tablet Take 10 mg by mouth at bedtime.     . metFORMIN (GLUCOPHAGE) 500 MG tablet Take 1 tablet (500 mg total) by mouth 2 (two) times daily with a meal. 180  tablet 1  . nitroGLYCERIN (NITROSTAT) 0.4 MG SL tablet Place 1 tablet (0.4 mg total) under the tongue every 5 (five) minutes as needed. For chest pain 50 tablet 3  . ONE TOUCH ULTRA TEST test strip TEST 3 TIMES A DAY 100 each 11  . ONETOUCH DELICA LANCETS 16R MISC TEST 3 TIMES A DAY 100 each 11  . pantoprazole (PROTONIX) 40 MG tablet TAKE ONE TABLET BY MOUTH ONCE DAILY 30 tablet 11  . simvastatin (ZOCOR) 40 MG tablet Take 1 tablet (40 mg total) by mouth daily. 90 tablet 3   No current facility-administered medications for this visit.    SURGICAL HISTORY:  Past Surgical History  Procedure Laterality Date  . Appendectomy     . Abdominal hysterectomy  1997  . Carpal tunnel release    . Left heart catheterization with coronary angiogram N/A 05/30/2012    Procedure: LEFT HEART CATHETERIZATION WITH CORONARY ANGIOGRAM;  Surgeon: Burnell Blanks, MD;  Location: [facility] CATH LAB;  Service: Cardiovascular;  Laterality: N/A;  . Colonoscopy  10/24/00  . Cholecystectomy  2002    REVIEW OF SYSTEMS:  A comprehensive review of systems was negative.   PHYSICAL EXAMINATION: General appearance: alert, cooperative and no distress Head: Normocephalic, without obvious abnormality, atraumatic Neck: no adenopathy Resp: clear to auscultation bilaterally Cardio: regular rate and rhythm, S1, S2 normal, no murmur, click, rub or gallop GI: soft, non-tender; bowel sounds normal; no masses,  no organomegaly Extremities: extremities normal, atraumatic, no cyanosis or edema  ECOG PERFORMANCE STATUS: 0 - Asymptomatic  Blood pressure 115/67, pulse 95, temperature 97.6 F (36.4 C), temperature source Oral, resp. rate 18, height 5' 2"  (1.575 m), weight 178 lb 11.2 oz (81.058 kg), SpO2 97 %.  LABORATORY DATA: Lab Results  Component Value Date   WBC 5.8 01/15/2015   HGB 14.0 01/15/2015   HCT 42.0 01/15/2015   MCV 88.5 01/15/2015   PLT 196 01/15/2015      Chemistry      Component Value Date/Time   NA 141 01/15/2015 0848   NA 136 08/21/2014 1024   NA 141 02/16/2012 0803   K 4.0 01/15/2015 0848   K 4.0 08/21/2014 1024   K 3.9 02/16/2012 0803   CL 103 08/21/2014 1024   CL 105 02/18/2013 0812   CL 99 02/16/2012 0803   CO2 25 01/15/2015 0848   CO2 28 08/21/2014 1024   CO2 28 02/16/2012 0803   BUN 9.0 01/15/2015 0848   BUN 11 08/21/2014 1024   BUN 12 02/16/2012 0803   CREATININE 0.7 01/15/2015 0848   CREATININE 0.8 08/21/2014 1024   CREATININE 0.9 02/16/2012 0803      Component Value Date/Time   CALCIUM 9.2 01/15/2015 0848   CALCIUM 9.4 08/21/2014 1024   CALCIUM 8.7 02/16/2012 0803   ALKPHOS 92 01/15/2015 0848   ALKPHOS  101 02/10/2014 0919   ALKPHOS 75 02/16/2012 0803   AST 19 01/15/2015 0848   AST 20 02/10/2014 0919   AST 20 02/16/2012 0803   ALT 20 01/15/2015 0848   ALT 19 02/10/2014 0919   ALT 22 02/16/2012 0803   BILITOT 0.34 01/15/2015 0848   BILITOT 0.6 02/10/2014 0919   BILITOT 0.70 02/16/2012 0803       RADIOGRAPHIC STUDIES: Ct Chest Wo Contrast  01/15/2015   CLINICAL DATA:  Followup lung cancer  EXAM: CT CHEST WITHOUT CONTRAST  TECHNIQUE: Multidetector CT imaging of the chest was performed following the standard protocol without IV contrast.  COMPARISON:  01/10/2014  FINDINGS: Mediastinum: The heart size appears normal. No pericardial effusion. There is calcified atherosclerotic disease involving the thoracic aorta as well as the LAD and RCA coronary arteries. No mediastinal or hilar adenopathy identified. The trachea is patent and midline. Mild circumferential wall thickening involving the esophagus is noted.  Lungs/Pleura: There is no pleural effusion. Radiation change within the medial aspect of the right upper lung is again identified. Again noted is a 4 mm nodule in the right lower lobe, image 29/series 5. No new or enlarging pulmonary nodule or mass noted.  Upper Abdomen: Incidental imaging through the upper abdomen is unremarkable. The visualized portions of the liver and spleen are normal.  Musculoskeletal: Review of the visualized bony structures is unremarkable. No aggressive lytic or sclerotic bone lesions identified.  IMPRESSION: 1. Stable CT of the chest. 2. No specific features identified to suggest residual or recurrence of tumor. 3. Stable radiation change in the right lung.   Electronically Signed   By: Kerby Moors M.D.   On: 01/15/2015 10:34   Mm Digital Screening Bilateral  01/05/2015   CLINICAL DATA:  Screening.  EXAM: DIGITAL SCREENING BILATERAL MAMMOGRAM WITH CAD  COMPARISON:  Previous exam(s).  ACR Breast Density Category b: There are scattered areas of fibroglandular density.   FINDINGS: There are no findings suspicious for malignancy. Images were processed with CAD.  IMPRESSION: No mammographic evidence of malignancy. A result letter of this screening mammogram will be mailed directly to the patient.  RECOMMENDATION: Screening mammogram in one year. (Code:SM-B-01Y)  BI-RADS CATEGORY  1: Negative.   Electronically Signed   By: Nolon Nations M.D.   On: 01/05/2015 07:52   ASSESSMENT AND PLAN: This is a very pleasant 69 years old white female with history of limited stage small cell lung cancer status post systemic chemotherapy concurrent with radiation followed by prophylactic cranial irradiation and has been observation since July of 2010 with no evidence for disease recurrence. The recent CT scan of the chest showed no evidence for disease recurrence. I discussed the scan results with the patient and her daughter.  I recommended for the patient to continue on observation with her primary care physician and consideration of annual chest x-ray with the routine physical exam. I don't see a need for the patient to continue her routine follow-up visit with me at this point but will be happy to see her in the future if needed. She was advised to call immediately if she has any concerning symptoms in the interval.  All questions were answered. The patient knows to call the clinic with any problems, questions or concerns. We can certainly see the patient much sooner if necessary.  Disclaimer: This note was dictated with voice recognition software. Similar sounding words can inadvertently be transcribed and may not be corrected upon review.

## 2015-01-30 ENCOUNTER — Encounter (INDEPENDENT_AMBULATORY_CARE_PROVIDER_SITE_OTHER): Payer: Medicare Other | Admitting: Ophthalmology

## 2015-01-30 DIAGNOSIS — H33103 Unspecified retinoschisis, bilateral: Secondary | ICD-10-CM | POA: Diagnosis not present

## 2015-01-30 DIAGNOSIS — H59033 Cystoid macular edema following cataract surgery, bilateral: Secondary | ICD-10-CM

## 2015-01-30 DIAGNOSIS — E11331 Type 2 diabetes mellitus with moderate nonproliferative diabetic retinopathy with macular edema: Secondary | ICD-10-CM

## 2015-01-30 DIAGNOSIS — E11351 Type 2 diabetes mellitus with proliferative diabetic retinopathy with macular edema: Secondary | ICD-10-CM | POA: Diagnosis not present

## 2015-01-30 DIAGNOSIS — H35033 Hypertensive retinopathy, bilateral: Secondary | ICD-10-CM | POA: Diagnosis not present

## 2015-01-30 DIAGNOSIS — H43813 Vitreous degeneration, bilateral: Secondary | ICD-10-CM

## 2015-01-30 DIAGNOSIS — I10 Essential (primary) hypertension: Secondary | ICD-10-CM | POA: Diagnosis not present

## 2015-01-30 DIAGNOSIS — E11311 Type 2 diabetes mellitus with unspecified diabetic retinopathy with macular edema: Secondary | ICD-10-CM

## 2015-02-20 DIAGNOSIS — J440 Chronic obstructive pulmonary disease with acute lower respiratory infection: Secondary | ICD-10-CM | POA: Diagnosis not present

## 2015-03-02 ENCOUNTER — Emergency Department (HOSPITAL_COMMUNITY)
Admission: EM | Admit: 2015-03-02 | Discharge: 2015-03-02 | Disposition: A | Discharge status: Home / Self Care | Payer: Medicare Other | Attending: Emergency Medicine | Admitting: Emergency Medicine

## 2015-03-02 ENCOUNTER — Encounter (HOSPITAL_COMMUNITY): Payer: Self-pay | Admitting: Emergency Medicine

## 2015-03-02 ENCOUNTER — Emergency Department (HOSPITAL_COMMUNITY): Payer: Medicare Other

## 2015-03-02 DIAGNOSIS — Z7982 Long term (current) use of aspirin: Secondary | ICD-10-CM | POA: Diagnosis not present

## 2015-03-02 DIAGNOSIS — Z794 Long term (current) use of insulin: Secondary | ICD-10-CM | POA: Insufficient documentation

## 2015-03-02 DIAGNOSIS — Z7952 Long term (current) use of systemic steroids: Secondary | ICD-10-CM | POA: Insufficient documentation

## 2015-03-02 DIAGNOSIS — E11319 Type 2 diabetes mellitus with unspecified diabetic retinopathy without macular edema: Secondary | ICD-10-CM | POA: Diagnosis not present

## 2015-03-02 DIAGNOSIS — E162 Hypoglycemia, unspecified: Secondary | ICD-10-CM

## 2015-03-02 DIAGNOSIS — J449 Chronic obstructive pulmonary disease, unspecified: Secondary | ICD-10-CM | POA: Insufficient documentation

## 2015-03-02 DIAGNOSIS — E11649 Type 2 diabetes mellitus with hypoglycemia without coma: Secondary | ICD-10-CM | POA: Insufficient documentation

## 2015-03-02 DIAGNOSIS — Z8669 Personal history of other diseases of the nervous system and sense organs: Secondary | ICD-10-CM | POA: Diagnosis not present

## 2015-03-02 DIAGNOSIS — R42 Dizziness and giddiness: Secondary | ICD-10-CM | POA: Diagnosis not present

## 2015-03-02 DIAGNOSIS — Z79899 Other long term (current) drug therapy: Secondary | ICD-10-CM | POA: Diagnosis not present

## 2015-03-02 DIAGNOSIS — I252 Old myocardial infarction: Secondary | ICD-10-CM | POA: Diagnosis not present

## 2015-03-02 DIAGNOSIS — Z87891 Personal history of nicotine dependence: Secondary | ICD-10-CM | POA: Diagnosis not present

## 2015-03-02 DIAGNOSIS — E785 Hyperlipidemia, unspecified: Secondary | ICD-10-CM | POA: Insufficient documentation

## 2015-03-02 DIAGNOSIS — G459 Transient cerebral ischemic attack, unspecified: Secondary | ICD-10-CM | POA: Diagnosis not present

## 2015-03-02 DIAGNOSIS — Z85118 Personal history of other malignant neoplasm of bronchus and lung: Secondary | ICD-10-CM | POA: Insufficient documentation

## 2015-03-02 LAB — I-STAT CHEM 8, ED
BUN: 13 mg/dL (ref 6–20)
CALCIUM ION: 1.28 mmol/L (ref 1.13–1.30)
Chloride: 102 mmol/L (ref 101–111)
Creatinine, Ser: 0.7 mg/dL (ref 0.44–1.00)
GLUCOSE: 77 mg/dL (ref 70–99)
HCT: 43 % (ref 36.0–46.0)
Hemoglobin: 14.6 g/dL (ref 12.0–15.0)
Potassium: 3.9 mmol/L (ref 3.5–5.1)
SODIUM: 140 mmol/L (ref 135–145)
TCO2: 25 mmol/L (ref 0–100)

## 2015-03-02 LAB — DIFFERENTIAL
BASOS ABS: 0 10*3/uL (ref 0.0–0.1)
Basophils Relative: 0 % (ref 0–1)
EOS PCT: 1 % (ref 0–5)
Eosinophils Absolute: 0.1 10*3/uL (ref 0.0–0.7)
Lymphocytes Relative: 13 % (ref 12–46)
Lymphs Abs: 1.2 10*3/uL (ref 0.7–4.0)
Monocytes Absolute: 0.9 10*3/uL (ref 0.1–1.0)
Monocytes Relative: 10 % (ref 3–12)
NEUTROS PCT: 76 % (ref 43–77)
Neutro Abs: 7.1 10*3/uL (ref 1.7–7.7)

## 2015-03-02 LAB — COMPREHENSIVE METABOLIC PANEL
ALK PHOS: 83 U/L (ref 38–126)
ALT: 17 U/L (ref 14–54)
ANION GAP: 7 (ref 5–15)
AST: 19 U/L (ref 15–41)
Albumin: 3.4 g/dL — ABNORMAL LOW (ref 3.5–5.0)
BILIRUBIN TOTAL: 0.3 mg/dL (ref 0.3–1.2)
BUN: 11 mg/dL (ref 6–20)
CALCIUM: 8.9 mg/dL (ref 8.9–10.3)
CO2: 25 mmol/L (ref 22–32)
CREATININE: 0.72 mg/dL (ref 0.44–1.00)
Chloride: 106 mmol/L (ref 101–111)
GFR calc Af Amer: >60 mL/min (ref >60)
GFR calc non Af Amer: >60 mL/min (ref >60)
Glucose, Bld: 72 mg/dL (ref 70–99)
Potassium: 4 mmol/L (ref 3.5–5.1)
Sodium: 138 mmol/L (ref 135–145)
TOTAL PROTEIN: 6.3 g/dL — AB (ref 6.5–8.1)

## 2015-03-02 LAB — CBC
HEMATOCRIT: 41.8 % (ref 36.0–46.0)
Hemoglobin: 13.9 g/dL (ref 12.0–15.0)
MCH: 30.2 pg (ref 26.0–34.0)
MCHC: 33.3 g/dL (ref 30.0–36.0)
MCV: 90.7 fL (ref 78.0–100.0)
Platelets: 187 10*3/uL (ref 150–400)
RBC: 4.61 MIL/uL (ref 3.87–5.11)
RDW: 15.1 % (ref 11.5–15.5)
WBC: 9.4 10*3/uL (ref 4.0–10.5)

## 2015-03-02 LAB — CBG MONITORING, ED
GLUCOSE-CAPILLARY: 69 mg/dL — AB (ref 70–99)
Glucose-Capillary: 160 mg/dL — ABNORMAL HIGH (ref 70–99)

## 2015-03-02 LAB — PROTIME-INR
INR: 1.01 (ref 0.00–1.49)
PROTHROMBIN TIME: 13.4 s (ref 11.6–15.2)

## 2015-03-02 LAB — APTT: APTT: 30 s (ref 24–37)

## 2015-03-02 LAB — I-STAT TROPONIN, ED: Troponin i, poc: 0 ng/mL (ref 0.00–0.08)

## 2015-03-02 MED ORDER — ACETAMINOPHEN 325 MG PO TABS
650.0000 mg | ORAL_TABLET | Freq: Once | ORAL | Status: AC
  Administered 2015-03-02: 650 mg via ORAL
  Filled 2015-03-02: qty 2

## 2015-03-02 NOTE — Consult Note (Signed)
Neurology Consultation Reason for Consult: lightheadedness Referring Physician: Sabra Heck, B  CC: lightheadedness  History is obtained from:patient, husband.   HPI: [name] is a 69 y.o. female with a history of small cell lung ca who presented following a transient episode of lightheadedness and perioral tingling. She also had a mild headache. She took her insulin as normal this morning and subsequently ate breakfast. She then stood up to go to a different room and became lightheaded and felt like she was going to pass out. She had perioral tingling on both sides of her mouth. This episode resolved adn she had a mild headache which resolved with tylenol.      ROS: A 14 point ROS was performed and is negative except as noted in the HPI.   Past Medical History  Diagnosis Date  . Diabetes mellitus   . GERD (gastroesophageal reflux disease)   . COPD (chronic obstructive pulmonary disease)   . History of diverticulitis of colon   . CAD (coronary artery disease)     NON obstructive. Cath 2006 preserved LV fxn, scattered irregularities without critical stenosis. 2008 stress echo negative for ischemia, but with hypertensive response  . Hyperlipidemia   . Mild cognitive impairment with memory loss     Likely from brain radiation therapy  . Bilateral diabetic retinopathy 2015    Background  . Age-related nuclear cataract of both eyes 2016    +cortical age related cataracts OU  . 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 Lung cancer     non-small cell lung ca, stage III in 05/2011, s/p chemo/radiation    Family History: Heart disease, dm  Social History: Tob: former smoker  Exam: Current vital signs: BP 124/51 mmHg  Pulse 90  Temp(Src) 97.7 F (36.5 C) (Oral)  Resp 21  Ht '5\' 1"'$  (1.549 m)  Wt 81.647 kg (180 lb)  BMI 34.03 kg/m2  SpO2 99% Vital signs in last 24 hours: Temp:  [97.4 F (36.3 C)-97.7 F (36.5 C)] 97.7 F (36.5 C) (05/09 1135) Pulse Rate:  [87-104] 90 (05/09  1245) Resp:  [18-24] 21 (05/09 1245) BP: (108-128)/(50-75) 124/51 mmHg (05/09 1245) SpO2:  [95 %-99 %] 99 % (05/09 1245) Weight:  [81.647 kg (180 lb)] 81.647 kg (180 lb) (05/09 1008)  Physical Exam  Constitutional: Appears well-developed and well-nourished.  Psych: Affect appropriate to situation Eyes: No scleral injection HENT: No OP obstrucion Head: Normocephalic.  Cardiovascular: Normal rate and regular rhythm.  Respiratory: Effort normal  GI: Soft.  No distension. There is no tenderness.  Skin: WDI  Neuro: Mental Status: Patient is awake, alert, oriented to person, place, month, year, and situation. Seh has mild difficulty with some aspect of the history.  No signs of aphasia or neglect Cranial Nerves: II: Visual Fields are full. Pupils are equal, round, and reactive to light.   III,IV, VI: EOMI without ptosis or diploplia.  V: Facial sensation is symmetric to temperature VII: Facial movement is symmetric.  VIII: hearing is intact to voice X: Uvula elevates symmetrically XI: Shoulder shrug is symmetric. XII: tongue is midline without atrophy or fasciculations.  Motor: Tone is normal. Bulk is normal. 5/5 strength was present in bilateral arms, poor effort bilateral legs.  Sensory: Sensation is symmetric to light touch and temperature in the arms and legs. Plantars: Toes are downgoing bilaterally.  Cerebellar: FNF intact bilaterally   I have reviewed labs in epic and the results pertinent to this consultation are: cbg 69  I have reviewed the  images obtained:CT head - negative  Impression: 69 yo F with transient lightheadedness and paresthesia. I suspect this was presyncope after standing, but also possible would be that her insulin had taken effect, but she had not yet digested her breakfast causing transient hypoglycemia.   I asked them to note both her blood glocose and BP if the symptoms were to recurr.   Recommendations: 1) BG and BP checks with future  episodes.    Roland Rack, MD [facility] [phone]  If 7pm- 7am, please page neurology on call as listed in [place].

## 2015-03-02 NOTE — ED Notes (Signed)
Dr. Sabra Heck at the bedside to see pt.

## 2015-03-02 NOTE — ED Notes (Signed)
Pt walked to bathroom without any difficulty and back to room. Placed back on monitor.

## 2015-03-02 NOTE — ED Provider Notes (Signed)
CSN: [id number]     Arrival date & time 03/02/15  4098 History   First MD Initiated Contact with Patient 03/02/15 1017     Chief Complaint  Patient presents with  . Transient Ischemic Attack     (Consider location/radiation/quality/duration/timing/severity/associated sxs/prior Treatment) HPI Comments: This AM, she was feeling dizzy while standing - had some headache at that time - sat down and then started having numbness of her mouth - husband reports that she was biting her lips and not feeling it, there was no complaint of weakness, difficulty ambulating or visual changes. Husband states that her voice has not changed. No difficulty speaking. She was able to eat a biscuit. - she decided to come to hospital - no more dizziness or numbness to the mouth - she was not responding to him when he tried to talk to her - this has resolved as well.  She does have short term memory loss.    Level 5 caveat applies secondary to memory loss.  The patient was noted to have taken her insulin before eating breakfast this morning, she did eat a biscuit and while she was eating as when her symptoms gradually improved.    The history is provided by the patient.    Past Medical History  Diagnosis Date  . Diabetes mellitus   . GERD (gastroesophageal reflux disease)   . COPD (chronic obstructive pulmonary disease)   . History of diverticulitis of colon   . CAD (coronary artery disease)     NON obstructive. Cath 2006 preserved LV fxn, scattered irregularities without critical stenosis. 2008 stress echo negative for ischemia, but with hypertensive response  . Hyperlipidemia   . Mild cognitive impairment with memory loss     Likely from brain radiation therapy  . Bilateral diabetic retinopathy 2015    Background  . Age-related nuclear cataract of both eyes 2016    +cortical age related cataracts OU  . 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 Lung cancer     non-small cell lung ca, stage III in 05/2011, s/p  chemo/radiation   Past Surgical History  Procedure Laterality Date  . Appendectomy    . Abdominal hysterectomy  1997  . Carpal tunnel release    . Left heart catheterization with coronary angiogram N/A 05/30/2012    Procedure: LEFT HEART CATHETERIZATION WITH CORONARY ANGIOGRAM;  Surgeon: Burnell Blanks, MD;  Location: [facility] CATH LAB;  Service: Cardiovascular;  Laterality: N/A;  . Colonoscopy  10/24/00  . Cholecystectomy  2002   Family History  Problem Relation Age of Onset  . Heart failure Mother   . Kidney disease Mother     renal failure  . Diabetes Mother   . Diabetes Father   . Diabetes Brother   . Heart disease Brother   . Heart attack Brother   . Heart attack Brother   . Heart attack Brother    History  Substance Use Topics  . Smoking status: Former Smoker -- 1.00 packs/day for 45 years    Types: Cigarettes    Quit date: 10/24/2008  . Smokeless tobacco: Not on file  . Alcohol Use: No   OB History    No data available     Review of Systems  Unable to perform ROS: Other      Allergies  Lactose intolerance (gi) and Ibuprofen  Home Medications   Prior to Admission medications   Medication Sig Start Date End Date Taking? Authorizing Provider  albuterol (PROVENTIL) (2.5 MG/3ML) 0.083% nebulizer  solution Take 3 mLs (2.5 mg total) by nebulization every 4 (four) hours as needed for wheezing or shortness of breath. 10/31/14  Yes [name], MD  ALPHAGAN P 0.1 % SOLN Place 1 drop into both eyes every 8 (eight) hours.  12/31/14  Yes Historical Provider, MD  aspirin 81 MG tablet Take 81 mg by mouth at bedtime.    Yes Historical Provider, MD  aspirin-acetaminophen-caffeine (EXCEDRIN MIGRAINE) [phone] MG per tablet Take 1 tablet by mouth every 6 (six) hours as needed for headache.   Yes Historical Provider, MD  Blood Glucose Monitoring Suppl (ONE TOUCH ULTRA 2) W/DEVICE KIT 1 each by Other route daily. 09/16/13  Yes Historical Provider, MD  dorzolamide (TRUSOPT) 2  % ophthalmic solution Place 1 drop into both eyes 2 (two) times daily.  12/29/14  Yes Historical Provider, MD  DUREZOL 0.05 % EMUL Place 1 drop into both eyes 2 (two) times daily.  12/23/14  Yes Historical Provider, MD  fluticasone (FLONASE) 50 MCG/ACT nasal spray Place 2 sprays into both nostrils at bedtime. Patient taking differently: Place 2 sprays into both nostrils at bedtime as needed for allergies.  06/25/14  Yes [name], MD  fluticasone (FLOVENT HFA) 220 MCG/ACT inhaler Inhale 2 puffs into the lungs 2 (two) times daily. 08/26/14  Yes [name], MD  Insulin Isophane & Regular Human (HUMULIN 70/30 KWIKPEN) (70-30) 100 UNIT/ML PEN 34 U SQ every morning before breakfast and 29 U every evening before supper 06/16/14  Yes [name], MD  loratadine (CLARITIN) 10 MG tablet Take 10 mg by mouth at bedtime.    Yes Historical Provider, MD  metFORMIN (GLUCOPHAGE) 500 MG tablet Take 1 tablet (500 mg total) by mouth 2 (two) times daily with a meal. 12/19/14  Yes [name], MD  nitroGLYCERIN (NITROSTAT) 0.4 MG SL tablet Place 1 tablet (0.4 mg total) under the tongue every 5 (five) minutes as needed. For chest pain 12/31/12  Yes Lisabeth Pick, MD  ONE TOUCH ULTRA TEST test strip TEST 3 TIMES A DAY 09/12/14  Yes [name], MD  [facility] DELICA LANCETS 54U MISC TEST 3 TIMES A DAY 08/21/14  Yes [name], MD  pantoprazole (PROTONIX) 40 MG tablet TAKE ONE TABLET BY MOUTH ONCE DAILY 05/13/14  Yes [name], MD  PROLENSA 0.07 % SOLN Place 1 drop into both eyes at bedtime. 03/01/15  Yes Historical Provider, MD  SIMBRINZA 1-0.2 % SUSP Place 1 drop into both eyes daily. 01/20/15  Yes Historical Provider, MD  simvastatin (ZOCOR) 40 MG tablet Take 1 tablet (40 mg total) by mouth daily.   Yes Darrick Penna Swords, MD   BP 124/51 mmHg  Pulse 90  Temp(Src) 97.7 F (36.5 C) (Oral)  Resp 21  Ht 5' 1"  (1.549 m)  Wt 180 lb (81.647 kg)  BMI 34.03 kg/m2  SpO2 99% Physical Exam   Constitutional: She appears well-developed and well-nourished. No distress.  HENT:  Head: Normocephalic and atraumatic.  Mouth/Throat: Oropharynx is clear and moist. No oropharyngeal exudate.  Eyes: Conjunctivae and EOM are normal. Pupils are equal, round, and reactive to light. Right eye exhibits no discharge. Left eye exhibits no discharge. No scleral icterus.  Neck: Normal range of motion. Neck supple. No JVD present. No thyromegaly present.  Cardiovascular: Normal rate, regular rhythm, normal heart sounds and intact distal pulses.  Exam reveals no gallop and no friction rub.   No murmur heard. Pulmonary/Chest: Effort normal and breath sounds  normal. No respiratory distress. She has no wheezes. She has no rales.  Abdominal: Soft. Bowel sounds are normal. She exhibits no distension and no mass. There is no tenderness.  Musculoskeletal: Normal range of motion. She exhibits no edema or tenderness.  Lymphadenopathy:    She has no cervical adenopathy.  Neurological: She is alert. Coordination normal.  Neurologic exam:  Speech clear, pupils equal round reactive to light, extraocular movements intact  Normal peripheral visual fields Cranial nerves III through XII normal including no facial droop Follows commands, moves all extremities x4, normal strength to bilateral upper and lower extremities at all major muscle groups including grip Sensation normal to light touch and pinprick Coordination intact, no limb ataxia, finger-nose-finger normal Rapid alternating movements normal No pronator drift Gait normal   Skin: Skin is warm and dry. No rash noted. No erythema.  Psychiatric: She has a normal mood and affect. Her behavior is normal.  Nursing note and vitals reviewed.   ED Course  Procedures (including critical care time) Labs Review Labs Reviewed  COMPREHENSIVE METABOLIC PANEL - Abnormal; Notable for the following:    Total Protein 6.3 (*)    Albumin 3.4 (*)    All other  components within normal limits  CBG MONITORING, ED - Abnormal; Notable for the following:    Glucose-Capillary 69 (*)    All other components within normal limits  PROTIME-INR  APTT  CBC  DIFFERENTIAL  I-STAT CHEM 8, ED  I-STAT TROPOININ, ED    Imaging Review Ct Head (brain) Wo Contrast  03/02/2015   CLINICAL DATA:  Transient ischemic attack with lightheadedness and dizziness.  EXAM: CT HEAD WITHOUT CONTRAST  TECHNIQUE: Contiguous axial images were obtained from the base of the skull through the vertex without intravenous contrast.  COMPARISON:  12/19/2008  FINDINGS: Skull and Sinuses:Negative for fracture or destructive process. The mastoids, middle ears, and imaged paranasal sinuses are clear.  Orbits: Bilateral cataract resection.  No acute findings.  Brain: No evidence of acute infarction, hemorrhage, hydrocephalus, or mass lesion/mass effect. There is generalized cerebral volume loss which has progressed from 2010. Bilateral cerebral white matter low-attenuation consistent with moderate chronic small vessel disease. White matter disease has progressed from 2010 head CT but appears stable from brain MRI 01/10/2014 .  IMPRESSION: 1. No acute intracranial findings. 2. Atrophy and small vessel disease, stable from 2015 brain MRI.   Electronically Signed   By: Monte Fantasia M.D.   On: 03/02/2015 11:28     EKG Interpretation   Date/Time:  Monday Mar 02 2015 10:32:30 EDT Ventricular Rate:  96 PR Interval:  162 QRS Duration: 94 QT Interval:  413 QTC Calculation: 522 R Axis:   71 Text Interpretation:  Sinus rhythm Anterior infarct, old Minimal ST  elevation, inferior leads Prolonged QT interval since last tracing no  significant change Confirmed by Sabra Heck  MD, Carold Eisner ([id number]) on 03/02/2015  10:40:58 AM      MDM   Final diagnoses:  Hypoglycemia  Dizziness    At this time the patient's exam is unremarkable, she has no carotid bruits, no focal neurologic deficits, she is not having a  headache, there is no numbness, no facial asymmetry, cranial nerves III through XII are normal. We'll proceed with strokelike workup, discussed with neurology  Labs are overall unremarkable except for blood glucose, there is no acute findings on CT scan and neurology, Dr. Leonel Ramsay, has seen the patient and agrees that the patient can be discharged home as this is not likely  to be a TIA but most likely related to either orthostatic hypotension or hypoglycemia. The patient has tolerated oral intake, she appears stable for discharge, the family is in agreement with the plan.  Meds given in ED:  Medications  acetaminophen (TYLENOL) tablet 650 mg (650 mg Oral Given 03/02/15 1232)      Noemi Chapel, MD 03/02/15 1308

## 2015-03-02 NOTE — ED Notes (Signed)
Given [place] sandwich to eat for the blood sugar. Dr. Sabra Heck aware and okay with this.

## 2015-03-02 NOTE — ED Notes (Signed)
Dr. Leonel Ramsay at the bedside.

## 2015-03-02 NOTE — ED Notes (Signed)
Pt reports feeling normal last night. Today while eating breakfast noted her mouth feeling dry and numb. Pt reports she was having neck pains. Began feeling lightheaded and dizzy. Stroke scale neg at triage. Pt reports symptoms have improved.

## 2015-03-02 NOTE — ED Notes (Signed)
CBG recheck of 160.

## 2015-03-02 NOTE — Discharge Instructions (Signed)
Please call your doctor for a followup appointment within 24-48 hours. When you talk to your doctor please let them know that you were seen in the emergency department and have them acquire all of your records so that they can discuss the findings with you and formulate a treatment plan to fully care for your new and ongoing problems. ° °

## 2015-03-03 ENCOUNTER — Ambulatory Visit (INDEPENDENT_AMBULATORY_CARE_PROVIDER_SITE_OTHER): Payer: Medicare Other | Admitting: Family Medicine

## 2015-03-03 ENCOUNTER — Encounter: Payer: Self-pay | Admitting: Family Medicine

## 2015-03-03 VITALS — BP 115/75 | HR 88 | Temp 98.7°F | Resp 16 | Wt 177.0 lb

## 2015-03-03 DIAGNOSIS — J438 Other emphysema: Secondary | ICD-10-CM | POA: Diagnosis not present

## 2015-03-03 DIAGNOSIS — E119 Type 2 diabetes mellitus without complications: Secondary | ICD-10-CM | POA: Diagnosis not present

## 2015-03-03 DIAGNOSIS — Z85118 Personal history of other malignant neoplasm of bronchus and lung: Secondary | ICD-10-CM

## 2015-03-03 DIAGNOSIS — E785 Hyperlipidemia, unspecified: Secondary | ICD-10-CM | POA: Diagnosis not present

## 2015-03-03 LAB — LIPID PANEL
CHOL/HDL RATIO: 5
CHOLESTEROL: 231 mg/dL — AB (ref 0–200)
HDL: 46.5 mg/dL (ref >39.00)
LDL Cholesterol: 156 mg/dL — ABNORMAL HIGH (ref 0–99)
NONHDL: 184.5
TRIGLYCERIDES: 142 mg/dL (ref 0.0–149.0)
VLDL: 28.4 mg/dL (ref 0.0–40.0)

## 2015-03-03 LAB — MICROALBUMIN / CREATININE URINE RATIO
CREATININE, U: 145.3 mg/dL
MICROALB/CREAT RATIO: 0.5 mg/g (ref 0.0–30.0)
Microalb, Ur: 0.7 mg/dL (ref 0.0–1.9)

## 2015-03-03 LAB — HEMOGLOBIN A1C: Hgb A1c MFr Bld: 7.4 % — ABNORMAL HIGH (ref 4.6–6.5)

## 2015-03-03 NOTE — Progress Notes (Signed)
OFFICE VISIT  03/11/2015   CC:  Chief Complaint  Patient presents with  . Follow-up    also seen at ER on 03/02/15 for hypoglycemia and dizziness   HPI:    Patient is a 69 y.o. Caucasian female who presents for 3 mo f/u DM 2, COPD, hyperlipidemia. Humulin 70/30 34 U qAM and 32 U qSupper: glucoses fasting range 80s to 130s, had a low gluc event yesterday prompting ED visit.  She ate BF and gave usual insulin dose then doesn't recall much after that.  ED w/u for TIA: CT showed no acute abnormality, EKG w/sinus rhythm, no change from prior.  Gluc 69.  Hypoglycemic rxn vs orthostatic hypotension event final dx.  Supper glucose range 120s-220s but mostly around 160 or less.   Feels much improved/back to normal today.    Sounds like compliance with flovent is improved but not perfect, still question of whether or not she understands that this is a preventative med.  Says she is requiring alb neb avg of once a week.  Compliant with statin.  She is fasting for FLP today.  Reviewed most recent f/u visit with oncologist, Dr. Earlie Server, from 01/20/15: long term remission has been documented and she has been released form oncology surveillance f/u as of that visit.   Past Medical History  Diagnosis Date  . Diabetes mellitus   . GERD (gastroesophageal reflux disease)   . COPD (chronic obstructive pulmonary disease)   . History of diverticulitis of colon   . CAD (coronary artery disease)     NON obstructive. Cath 2006 preserved LV fxn, scattered irregularities without critical stenosis. 2008 stress echo negative for ischemia, but with hypertensive response  . Hyperlipidemia   . Mild cognitive impairment with memory loss     Likely from brain radiation therapy  . Bilateral diabetic retinopathy 2015    Background  . Age-related nuclear cataract of both eyes 2016    +cortical age related cataracts OU  . 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 Lung cancer     non-small cell lung ca, stage III in  05/2011, s/p chemo/radiation    Past Surgical History  Procedure Laterality Date  . Appendectomy    . Abdominal hysterectomy  1997  . Carpal tunnel release    . Left heart catheterization with coronary angiogram N/A 05/30/2012    Procedure: LEFT HEART CATHETERIZATION WITH CORONARY ANGIOGRAM;  Surgeon: Burnell Blanks, MD;  Location: [facility] CATH LAB;  Service: Cardiovascular;  Laterality: N/A;  . Colonoscopy  10/24/00  . Cholecystectomy  2002    Outpatient Prescriptions Prior to Visit  Medication Sig Dispense Refill  . albuterol (PROVENTIL) (2.5 MG/3ML) 0.083% nebulizer solution Take 3 mLs (2.5 mg total) by nebulization every 4 (four) hours as needed for wheezing or shortness of breath. 75 mL 3  . ALPHAGAN P 0.1 % SOLN Place 1 drop into both eyes every 8 (eight) hours.     Marland Kitchen aspirin 81 MG tablet Take 81 mg by mouth at bedtime.     Marland Kitchen aspirin-acetaminophen-caffeine (EXCEDRIN MIGRAINE) 250-250-65 MG per tablet Take 1 tablet by mouth every 6 (six) hours as needed for headache.    . Blood Glucose Monitoring Suppl (ONE TOUCH ULTRA 2) W/DEVICE KIT 1 each by Other route daily.    . dorzolamide (TRUSOPT) 2 % ophthalmic solution Place 1 drop into both eyes 2 (two) times daily.     . DUREZOL 0.05 % EMUL Place 1 drop into both eyes 2 (two) times daily.     Marland Kitchen  fluticasone (FLONASE) 50 MCG/ACT nasal spray Place 2 sprays into both nostrils at bedtime. (Patient taking differently: Place 2 sprays into both nostrils at bedtime as needed for allergies. ) 16 g 3  . fluticasone (FLOVENT HFA) 220 MCG/ACT inhaler Inhale 2 puffs into the lungs 2 (two) times daily. 1 Inhaler 12  . Insulin Isophane & Regular Human (HUMULIN 70/30 KWIKPEN) (70-30) 100 UNIT/ML PEN 34 U SQ every morning before breakfast and 29 U every evening before supper 15 mL 3  . loratadine (CLARITIN) 10 MG tablet Take 10 mg by mouth at bedtime.     . metFORMIN (GLUCOPHAGE) 500 MG tablet Take 1 tablet (500 mg total) by mouth 2 (two) times daily with a  meal. 180 tablet 1  . nitroGLYCERIN (NITROSTAT) 0.4 MG SL tablet Place 1 tablet (0.4 mg total) under the tongue every 5 (five) minutes as needed. For chest pain 50 tablet 3  . ONE TOUCH ULTRA TEST test strip TEST 3 TIMES A DAY 100 each 11  . ONETOUCH DELICA LANCETS 70W MISC TEST 3 TIMES A DAY 100 each 11  . pantoprazole (PROTONIX) 40 MG tablet TAKE ONE TABLET BY MOUTH ONCE DAILY 30 tablet 11  . PROLENSA 0.07 % SOLN Place 1 drop into both eyes at bedtime.    Marland Kitchen SIMBRINZA 1-0.2 % SUSP Place 1 drop into both eyes daily.    . simvastatin (ZOCOR) 40 MG tablet Take 1 tablet (40 mg total) by mouth daily. 90 tablet 3   No facility-administered medications prior to visit.    Allergies  Allergen Reactions  . Lactose Intolerance (Gi) Diarrhea  . Ibuprofen     REACTION: Anxiousness, hypoventilate     ROS As per HPI  PE: Blood pressure 115/75, pulse 88, temperature 98.7 F (37.1 C), temperature source Temporal, resp. rate 16, weight 177 lb (80.287 kg), SpO2 95 %. Gen: Alert, well appearing.  Patient is oriented to person, place, time, and situation. AFFECT: pleasant, lucid thought and speech. CV: RRR, distant S1 and S2, no audible murmur/rub/gallop LUNGS: CTA bilat, nonlabored resps EXT: no clubbing, cyanosis, or edema.   LABS:  Lab Results  Component Value Date   HGBA1C 7.4* 03/03/2015     Chemistry      Component Value Date/Time   NA 140 03/02/2015 1131   NA 141 01/15/2015 0848   NA 141 02/16/2012 0803   K 3.9 03/02/2015 1131   K 4.0 01/15/2015 0848   K 3.9 02/16/2012 0803   CL 102 03/02/2015 1131   CL 105 02/18/2013 0812   CL 99 02/16/2012 0803   CO2 25 03/02/2015 1120   CO2 25 01/15/2015 0848   CO2 28 02/16/2012 0803   BUN 13 03/02/2015 1131   BUN 9.0 01/15/2015 0848   BUN 12 02/16/2012 0803   CREATININE 0.70 03/02/2015 1131   CREATININE 0.7 01/15/2015 0848   CREATININE 0.9 02/16/2012 0803      Component Value Date/Time   CALCIUM 8.9 03/02/2015 1120   CALCIUM 9.2  01/15/2015 0848   CALCIUM 8.7 02/16/2012 0803   ALKPHOS 83 03/02/2015 1120   ALKPHOS 92 01/15/2015 0848   ALKPHOS 75 02/16/2012 0803   AST 19 03/02/2015 1120   AST 19 01/15/2015 0848   AST 20 02/16/2012 0803   ALT 17 03/02/2015 1120   ALT 20 01/15/2015 0848   ALT 22 02/16/2012 0803   BILITOT 0.3 03/02/2015 1120   BILITOT 0.34 01/15/2015 0848   BILITOT 0.70 02/16/2012 0803     Lab  Results  Component Value Date   WBC 9.4 03/02/2015   HGB 14.6 03/02/2015   HCT 43.0 03/02/2015   MCV 90.7 03/02/2015   PLT 187 03/02/2015   Lab Results  Component Value Date   CHOL 231* 03/03/2015   HDL 46.50 03/03/2015   LDLCALC 156* 03/03/2015   LDLDIRECT 109.8 07/06/2012   TRIG 142.0 03/03/2015   CHOLHDL 5 03/03/2015    IMPRESSION AND PLAN:  1) DM 2, with complications recent hypoglyc event, doing better now.  No change in meds necessary. Check HbA1c as well as urine microalb/cr today.  2) Hyperlipidemia: recheck FLP today.  3) COPD: stable.  Reminded pt once again that flovent is a med to take [place], not prn.  4) Hx of lung cancer: released from Dr. Earlie Server: she just needs annual CXR by PCP at this juncture. Most recent imaging was CT check 01/15/15 and this showed no sign of recurrence.  An After Visit Summary was printed and given to the patient.  FOLLOW UP: Return in about 4 months (around 07/04/2015) for routine chronic illness f/u (30 min).

## 2015-03-03 NOTE — Progress Notes (Signed)
Pre visit review using our clinic review tool, if applicable. No additional management support is needed unless otherwise documented below in the visit note. 

## 2015-03-04 MED ORDER — ATORVASTATIN CALCIUM 40 MG PO TABS: 40.0000 mg | ORAL_TABLET | Freq: Every day | ORAL | Status: DC

## 2015-03-11 ENCOUNTER — Encounter: Payer: Self-pay | Admitting: Family Medicine

## 2015-03-13 ENCOUNTER — Encounter (INDEPENDENT_AMBULATORY_CARE_PROVIDER_SITE_OTHER): Payer: Medicare Other | Admitting: Ophthalmology

## 2015-03-13 DIAGNOSIS — H35033 Hypertensive retinopathy, bilateral: Secondary | ICD-10-CM

## 2015-03-13 DIAGNOSIS — H33103 Unspecified retinoschisis, bilateral: Secondary | ICD-10-CM | POA: Diagnosis not present

## 2015-03-13 DIAGNOSIS — I10 Essential (primary) hypertension: Secondary | ICD-10-CM | POA: Diagnosis not present

## 2015-03-13 DIAGNOSIS — E11331 Type 2 diabetes mellitus with moderate nonproliferative diabetic retinopathy with macular edema: Secondary | ICD-10-CM | POA: Diagnosis not present

## 2015-03-13 DIAGNOSIS — H43813 Vitreous degeneration, bilateral: Secondary | ICD-10-CM | POA: Diagnosis not present

## 2015-03-13 DIAGNOSIS — E11311 Type 2 diabetes mellitus with unspecified diabetic retinopathy with macular edema: Secondary | ICD-10-CM

## 2015-03-13 DIAGNOSIS — E11351 Type 2 diabetes mellitus with proliferative diabetic retinopathy with macular edema: Secondary | ICD-10-CM | POA: Diagnosis not present

## 2015-03-22 DIAGNOSIS — J440 Chronic obstructive pulmonary disease with acute lower respiratory infection: Secondary | ICD-10-CM | POA: Diagnosis not present

## 2015-04-06 ENCOUNTER — Encounter: Payer: Self-pay | Admitting: Family Medicine

## 2015-04-06 ENCOUNTER — Ambulatory Visit (INDEPENDENT_AMBULATORY_CARE_PROVIDER_SITE_OTHER): Payer: Medicare Other | Admitting: Family Medicine

## 2015-04-06 VITALS — BP 116/79 | HR 103 | Temp 97.6°F | Resp 18 | Ht 62.0 in | Wt 180.0 lb

## 2015-04-06 DIAGNOSIS — Z23 Encounter for immunization: Secondary | ICD-10-CM

## 2015-04-06 DIAGNOSIS — Z13820 Encounter for screening for osteoporosis: Secondary | ICD-10-CM | POA: Diagnosis not present

## 2015-04-06 DIAGNOSIS — Z1211 Encounter for screening for malignant neoplasm of colon: Secondary | ICD-10-CM | POA: Diagnosis not present

## 2015-04-06 DIAGNOSIS — Z0000 Encounter for general adult medical examination without abnormal findings: Secondary | ICD-10-CM

## 2015-04-06 MED ORDER — ZOSTER VACCINE LIVE 19400 UNT/0.65ML ~~LOC~~ SOLR: 0.6500 mL | Freq: Once | SUBCUTANEOUS | Status: DC

## 2015-04-06 NOTE — Addendum Note (Signed)
Addended by: Lanae Crumbly on: 04/06/2015 10:43 AM   Modules accepted: Orders

## 2015-04-06 NOTE — Progress Notes (Signed)
Pre visit review using our clinic review tool, if applicable. No additional management support is needed unless otherwise documented below in the visit note. 

## 2015-04-06 NOTE — Progress Notes (Signed)
The patient is here for annual Medicare wellness examination and management of other chronic and acute problems.   The risk factors are reflected in the social history.  The roster of all physicians providing medical care to patient - is listed in the Snapshot section of the chart.  Activities of daily living:  The patient is 100% inedpendent in all ADLs: dressing, toileting, feeding as well as independent mobility.  Home safety : The patient has smoke detectors in the home.  They wear seatbelts.  No firearms at home ( firearms are present in the home, kept in a safe fashion).  There is no violence in the home.   There is no risks for hepatitis, STDs or HIV. There is no   history of blood transfusion. They have no travel history to infectious disease endemic areas of the world.  The patient has not seen their dentist in the last six months: she has dentures.. They have seen their eye doctor in the last year. They deny any hearing difficulty and have not had audiologic testing in the last year.  They do not  have excessive sun exposure. Discussed the need for sun protection: hats, long sleeves and use of sunscreen if there is significant sun exposure.   Diet: the importance of a healthy diet is discussed.  They do have a "fairly" healthy (unhealthy-high fat/fast food) diet "but other people say I don't eat enough.  The patient has a regular exercise program: no.  She is active in cleaning her home, washing dishes, chasing after grandkids, but no formal exercise regimen.  The benefits of regular aerobic exercise were discussed.  Depression screen: there are no signs or vegative symptoms of depression- irritability, change in appetite, anhedonia, sadness/tearfullness.  Cognitive assessment: the patient manages all their financial and personal affairs and is actively engaged.  They could relate day,date,year and events; recalled 3/3 objects at 3 minutes; performed clock-face test normally.  The  following portions of the patient's history were reviewed and updated as appropriate: allergies, current medications, past family history, past medical history,  past surgical history, past social history  and problem list.  Vision, hearing, body mass index were assessed and reviewed.   During the course of the visit the patient was educated and counseled about appropriate screening and preventive services including : fall prevention , diabetes screening, nutrition counseling, colorectal cancer screening, and recommended immunizations.  Preventative services discussed with pt and decided to order today: 1) DEXA 2) Zostavax 3) Referral to GI for colon cancer screening.

## 2015-04-06 NOTE — Addendum Note (Signed)
Addended by: Lanae Crumbly on: 04/06/2015 09:56 AM   Modules accepted: Orders

## 2015-04-15 ENCOUNTER — Other Ambulatory Visit: Payer: Self-pay | Admitting: *Deleted

## 2015-04-15 DIAGNOSIS — Z780 Asymptomatic menopausal state: Secondary | ICD-10-CM

## 2015-04-20 ENCOUNTER — Other Ambulatory Visit: Payer: Self-pay | Admitting: *Deleted

## 2015-04-20 DIAGNOSIS — N951 Menopausal and female climacteric states: Secondary | ICD-10-CM

## 2015-04-22 DIAGNOSIS — J440 Chronic obstructive pulmonary disease with acute lower respiratory infection: Secondary | ICD-10-CM | POA: Diagnosis not present

## 2015-05-13 ENCOUNTER — Encounter (INDEPENDENT_AMBULATORY_CARE_PROVIDER_SITE_OTHER): Payer: Medicare Other | Admitting: Ophthalmology

## 2015-05-13 DIAGNOSIS — E11321 Type 2 diabetes mellitus with mild nonproliferative diabetic retinopathy with macular edema: Secondary | ICD-10-CM

## 2015-05-13 DIAGNOSIS — H35033 Hypertensive retinopathy, bilateral: Secondary | ICD-10-CM | POA: Diagnosis not present

## 2015-05-13 DIAGNOSIS — H43813 Vitreous degeneration, bilateral: Secondary | ICD-10-CM | POA: Diagnosis not present

## 2015-05-13 DIAGNOSIS — H33103 Unspecified retinoschisis, bilateral: Secondary | ICD-10-CM

## 2015-05-13 DIAGNOSIS — I10 Essential (primary) hypertension: Secondary | ICD-10-CM

## 2015-05-13 DIAGNOSIS — E11351 Type 2 diabetes mellitus with proliferative diabetic retinopathy with macular edema: Secondary | ICD-10-CM

## 2015-05-13 DIAGNOSIS — E11311 Type 2 diabetes mellitus with unspecified diabetic retinopathy with macular edema: Secondary | ICD-10-CM

## 2015-05-22 DIAGNOSIS — J440 Chronic obstructive pulmonary disease with acute lower respiratory infection: Secondary | ICD-10-CM | POA: Diagnosis not present

## 2015-05-26 DIAGNOSIS — Z961 Presence of intraocular lens: Secondary | ICD-10-CM | POA: Diagnosis not present

## 2015-05-26 DIAGNOSIS — E13351 Other specified diabetes mellitus with proliferative diabetic retinopathy with macular edema: Secondary | ICD-10-CM | POA: Diagnosis not present

## 2015-05-26 DIAGNOSIS — H40053 Ocular hypertension, bilateral: Secondary | ICD-10-CM | POA: Diagnosis not present

## 2015-06-05 ENCOUNTER — Other Ambulatory Visit: Payer: Self-pay | Admitting: Family Medicine

## 2015-06-09 ENCOUNTER — Telehealth: Payer: Self-pay | Admitting: *Deleted

## 2015-06-09 NOTE — Telephone Encounter (Signed)
Pts husband called requesting refill for pts Humulin 70/30. I advised him this was sent to pharmacy on 06/05/15 and I will call pharmacy to confirm, if they did not receive I will give verbal okay to fill as directed on 06/05/15. Called pharmacy and gave verbal to fill Rx as directed on 06/05/15. Pt husband advised and voiced understanding.

## 2015-06-16 DIAGNOSIS — Z961 Presence of intraocular lens: Secondary | ICD-10-CM | POA: Diagnosis not present

## 2015-06-16 DIAGNOSIS — H40053 Ocular hypertension, bilateral: Secondary | ICD-10-CM | POA: Diagnosis not present

## 2015-06-16 DIAGNOSIS — E13351 Other specified diabetes mellitus with proliferative diabetic retinopathy with macular edema: Secondary | ICD-10-CM | POA: Diagnosis not present

## 2015-06-22 DIAGNOSIS — J440 Chronic obstructive pulmonary disease with acute lower respiratory infection: Secondary | ICD-10-CM | POA: Diagnosis not present

## 2015-07-02 ENCOUNTER — Encounter (INDEPENDENT_AMBULATORY_CARE_PROVIDER_SITE_OTHER): Payer: Medicare Other | Admitting: Ophthalmology

## 2015-07-02 DIAGNOSIS — H43813 Vitreous degeneration, bilateral: Secondary | ICD-10-CM | POA: Diagnosis not present

## 2015-07-02 DIAGNOSIS — E11351 Type 2 diabetes mellitus with proliferative diabetic retinopathy with macular edema: Secondary | ICD-10-CM

## 2015-07-02 DIAGNOSIS — E11311 Type 2 diabetes mellitus with unspecified diabetic retinopathy with macular edema: Secondary | ICD-10-CM

## 2015-07-02 DIAGNOSIS — H35033 Hypertensive retinopathy, bilateral: Secondary | ICD-10-CM | POA: Diagnosis not present

## 2015-07-02 DIAGNOSIS — I10 Essential (primary) hypertension: Secondary | ICD-10-CM

## 2015-07-03 ENCOUNTER — Ambulatory Visit (INDEPENDENT_AMBULATORY_CARE_PROVIDER_SITE_OTHER): Payer: Medicare Other | Admitting: Family Medicine

## 2015-07-03 ENCOUNTER — Encounter: Payer: Self-pay | Admitting: Family Medicine

## 2015-07-03 VITALS — BP 125/74 | HR 84 | Temp 97.9°F | Resp 16 | Ht 62.0 in | Wt 181.0 lb

## 2015-07-03 DIAGNOSIS — E1165 Type 2 diabetes mellitus with hyperglycemia: Secondary | ICD-10-CM

## 2015-07-03 DIAGNOSIS — IMO0002 Reserved for concepts with insufficient information to code with codable children: Secondary | ICD-10-CM

## 2015-07-03 DIAGNOSIS — E782 Mixed hyperlipidemia: Secondary | ICD-10-CM | POA: Diagnosis not present

## 2015-07-03 DIAGNOSIS — J438 Other emphysema: Secondary | ICD-10-CM

## 2015-07-03 DIAGNOSIS — Z23 Encounter for immunization: Secondary | ICD-10-CM | POA: Diagnosis not present

## 2015-07-03 DIAGNOSIS — E118 Type 2 diabetes mellitus with unspecified complications: Secondary | ICD-10-CM | POA: Diagnosis not present

## 2015-07-03 MED ORDER — INSULIN ISOPHANE & REGULAR (HUMAN 70-30)100 UNIT/ML KWIKPEN: PEN_INJECTOR | SUBCUTANEOUS | Status: DC

## 2015-07-03 NOTE — Progress Notes (Signed)
OFFICE VISIT  07/03/2015   CC:  Chief Complaint  Patient presents with  . Follow-up    Pt is not fasting.   HPI:    Patient is a 69 y.o. Caucasian female who presents for 4 mo f/u DM 2, hyperlipidemia, and COPD. Having some diabetic retinopathy problems lately, laser treatments. Husband says she eats a good diet. Fasting glucs ranging 140s to 200s, pre-supper gluc range a bit higher--avg 180 or so. No hypoglycemia. Taking 34 U qAM and 32 qPM--Humulin 70/30 kwikpen.  Compliant with cholesterol med daily, no side effects. Says breathing feels good.  Taking flovent qd, only occ needing albut neb.  Past Medical History  Diagnosis Date  . Diabetes mellitus with complication     background diab retpthy [facility]  . GERD (gastroesophageal reflux disease)   . COPD (chronic obstructive pulmonary disease)   . History of diverticulitis of colon   . CAD (coronary artery disease)     NON obstructive. Cath 2006 preserved LV fxn, scattered irregularities without critical stenosis. 2008 stress echo negative for ischemia, but with hypertensive response  . Hyperlipidemia   . Mild cognitive impairment with memory loss     Likely from brain radiation therapy  . Bilateral diabetic retinopathy 2015    Background  . Age-related nuclear cataract of both eyes 2016    +cortical age related cataracts OU  . 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 Lung cancer     non-small cell lung ca, stage III in 05/2011; systemic chemotherapy concurrent with radiation followed by prophylactic cranial irradiation and has been observation since July of 2010 with no evidence for disease recurrence-released from onc f/u 12/2014 (needs annual cxr by PCP)    Past Surgical History  Procedure Laterality Date  . Appendectomy    . Abdominal hysterectomy  1997  . Carpal tunnel release    . Left heart catheterization with coronary angiogram N/A 05/30/2012    Procedure: LEFT HEART CATHETERIZATION WITH CORONARY ANGIOGRAM;  Surgeon:  Burnell Blanks, MD;  Location: [facility] CATH LAB;  Service: Cardiovascular;  Laterality: N/A;  . Colonoscopy  10/24/00  . Cholecystectomy  2002    Outpatient Prescriptions Prior to Visit  Medication Sig Dispense Refill  . albuterol (PROVENTIL) (2.5 MG/3ML) 0.083% nebulizer solution Take 3 mLs (2.5 mg total) by nebulization every 4 (four) hours as needed for wheezing or shortness of breath. 75 mL 3  . ALPHAGAN P 0.1 % SOLN Place 1 drop into both eyes every 8 (eight) hours.     Marland Kitchen aspirin 81 MG tablet Take 81 mg by mouth at bedtime.     Marland Kitchen aspirin-acetaminophen-caffeine (EXCEDRIN MIGRAINE) 250-250-65 MG per tablet Take 1 tablet by mouth every 6 (six) hours as needed for headache.    Marland Kitchen atorvastatin (LIPITOR) 40 MG tablet Take 1 tablet (40 mg total) by mouth daily. 30 tablet 3  . Blood Glucose Monitoring Suppl (ONE TOUCH ULTRA 2) W/DEVICE KIT 1 each by Other route daily.    . dorzolamide (TRUSOPT) 2 % ophthalmic solution Place 1 drop into both eyes 2 (two) times daily.     . DUREZOL 0.05 % EMUL Place 1 drop into both eyes 2 (two) times daily.     . fluticasone (FLONASE) 50 MCG/ACT nasal spray Place 2 sprays into both nostrils at bedtime. (Patient taking differently: Place 2 sprays into both nostrils at bedtime as needed for allergies. ) 16 g 3  . fluticasone (FLOVENT HFA) 220 MCG/ACT inhaler Inhale 2 puffs into the lungs  2 (two) times daily. 1 Inhaler 12  . loratadine (CLARITIN) 10 MG tablet Take 10 mg by mouth at bedtime.     . metFORMIN (GLUCOPHAGE) 500 MG tablet Take 1 tablet (500 mg total) by mouth 2 (two) times daily with a meal. 180 tablet 1  . nitroGLYCERIN (NITROSTAT) 0.4 MG SL tablet Place 1 tablet (0.4 mg total) under the tongue every 5 (five) minutes as needed. For chest pain 50 tablet 3  . ONE TOUCH ULTRA TEST test strip TEST 3 TIMES A DAY 100 each 11  . ONETOUCH DELICA LANCETS 54O MISC TEST 3 TIMES A DAY 100 each 11  . pantoprazole (PROTONIX) 40 MG tablet TAKE ONE TABLET BY MOUTH ONCE  DAILY 30 tablet 11  . PROLENSA 0.07 % SOLN Place 1 drop into both eyes at bedtime.    Marland Kitchen SIMBRINZA 1-0.2 % SUSP Place 1 drop into both eyes daily.    Marland Kitchen zoster vaccine live, PF, (ZOSTAVAX) 27035 UNT/0.65ML injection Inject 19,400 Units into the skin once. 1 each 0  . HUMULIN 70/30 KWIKPEN (70-30) 100 UNIT/ML PEN INJECT 34 UNITS SUBCUTANEOUSLY BEFORE BREAKFAST AND 29 UNITS BEFORE SUPPER 15 mL 12  . Insulin Isophane & Regular Human (HUMULIN 70/30 KWIKPEN) (70-30) 100 UNIT/ML PEN 34 U SQ every morning before breakfast and 29 U every evening before supper 15 mL 3   No facility-administered medications prior to visit.    Allergies  Allergen Reactions  . Lactose Intolerance (Gi) Diarrhea  . Ibuprofen     REACTION: Anxiousness, hypoventilate     ROS As per HPI  PE: Blood pressure 125/74, pulse 84, temperature 97.9 F (36.6 C), temperature source Oral, resp. rate 16, height 5' 2"  (1.575 m), weight 181 lb (82.101 kg), SpO2 94 %. Gen: Alert, well appearing.  Patient is oriented to person, place, time, and situation. CV: RRR, no m/r/g.   LUNGS: CTA bilat, nonlabored resps, good aeration in all lung fields. EXT: no clubbing, cyanosis, or edema.    LABS:  Lab Results  Component Value Date   HGBA1C 7.4* 03/03/2015   Lab Results  Component Value Date   CHOL 231* 03/03/2015   HDL 46.50 03/03/2015   LDLCALC 156* 03/03/2015   LDLDIRECT 109.8 07/06/2012   TRIG 142.0 03/03/2015   CHOLHDL 5 03/03/2015     Chemistry      Component Value Date/Time   NA 140 03/02/2015 1131   NA 141 01/15/2015 0848   NA 141 02/16/2012 0803   K 3.9 03/02/2015 1131   K 4.0 01/15/2015 0848   K 3.9 02/16/2012 0803   CL 102 03/02/2015 1131   CL 105 02/18/2013 0812   CL 99 02/16/2012 0803   CO2 25 03/02/2015 1120   CO2 25 01/15/2015 0848   CO2 28 02/16/2012 0803   BUN 13 03/02/2015 1131   BUN 9.0 01/15/2015 0848   BUN 12 02/16/2012 0803   CREATININE 0.70 03/02/2015 1131   CREATININE 0.7 01/15/2015 0848    CREATININE 0.9 02/16/2012 0803      Component Value Date/Time   CALCIUM 8.9 03/02/2015 1120   CALCIUM 9.2 01/15/2015 0848   CALCIUM 8.7 02/16/2012 0803   ALKPHOS 83 03/02/2015 1120   ALKPHOS 92 01/15/2015 0848   ALKPHOS 75 02/16/2012 0803   AST 19 03/02/2015 1120   AST 19 01/15/2015 0848   AST 20 02/16/2012 0803   ALT 17 03/02/2015 1120   ALT 20 01/15/2015 0848   ALT 22 02/16/2012 0803   BILITOT 0.3 03/02/2015  1120   BILITOT 0.34 01/15/2015 0848   BILITOT 0.70 02/16/2012 0803       IMPRESSION AND PLAN:  1) DM 2, control not ideal. Needs tighter control now more than ever since having diab retinopathy troubles. Increase Humulin 70/30 kwikpen to 38 U qam and 34 U qpm. Continue bid gluc monitoring. We'll do A1c at next f/u in 2 weeks and check feet exam at that time.  2) Hyperlipidemia: started statin 4 mo ago but pt not fasting for recheck FLP today. Will check this in 2 wks at f/u. AST/ALT good 02/2015.  3) COPD: The current medical regimen is effective;  continue present plan and medications. High dose flu vaccine given today.  An After Visit Summary was printed and given to the patient.  FOLLOW UP: Return in about 2 weeks (around 07/17/2015) for f/u DM and hyperlip (needs to be fasting).

## 2015-07-03 NOTE — Progress Notes (Signed)
Pre visit review using our clinic review tool, if applicable. No additional management support is needed unless otherwise documented below in the visit note. 

## 2015-07-03 NOTE — Addendum Note (Signed)
Addended by: Lanae Crumbly on: 07/03/2015 09:48 AM   Modules accepted: Orders

## 2015-07-08 ENCOUNTER — Encounter: Payer: Self-pay | Admitting: Family Medicine

## 2015-07-17 ENCOUNTER — Encounter: Payer: Self-pay | Admitting: Family Medicine

## 2015-07-17 ENCOUNTER — Ambulatory Visit (INDEPENDENT_AMBULATORY_CARE_PROVIDER_SITE_OTHER): Payer: Medicare Other | Admitting: Family Medicine

## 2015-07-17 VITALS — BP 124/79 | HR 82 | Temp 97.2°F | Resp 16 | Wt 181.8 lb

## 2015-07-17 DIAGNOSIS — E1165 Type 2 diabetes mellitus with hyperglycemia: Secondary | ICD-10-CM

## 2015-07-17 DIAGNOSIS — E785 Hyperlipidemia, unspecified: Secondary | ICD-10-CM

## 2015-07-17 DIAGNOSIS — IMO0002 Reserved for concepts with insufficient information to code with codable children: Secondary | ICD-10-CM

## 2015-07-17 DIAGNOSIS — E118 Type 2 diabetes mellitus with unspecified complications: Secondary | ICD-10-CM | POA: Diagnosis not present

## 2015-07-17 DIAGNOSIS — E782 Mixed hyperlipidemia: Secondary | ICD-10-CM | POA: Diagnosis not present

## 2015-07-17 LAB — LDL CHOLESTEROL, DIRECT: LDL DIRECT: 162 mg/dL

## 2015-07-17 LAB — LIPID PANEL
Cholesterol: 213 mg/dL — ABNORMAL HIGH (ref 0–200)
HDL: 32.5 mg/dL — ABNORMAL LOW (ref >39.00)
NonHDL: 180.51
Total CHOL/HDL Ratio: 7
Triglycerides: 212 mg/dL — ABNORMAL HIGH (ref 0.0–149.0)
VLDL: 42.4 mg/dL — AB (ref 0.0–40.0)

## 2015-07-17 LAB — HEMOGLOBIN A1C: HEMOGLOBIN A1C: 7.4 % — AB (ref 4.6–6.5)

## 2015-07-17 NOTE — Progress Notes (Signed)
OFFICE NOTE  07/17/2015  CC:  Chief Complaint  Patient presents with  . Follow-up    diabetes, 2 week f/u   HPI: Patient is a 69 y.o. Caucasian female who is here for 2 week f/u DM 2 and hyperlipidemia. Glucoses improving "113 yesterday morning and this morning".  She can't offer much more details.  No hypoglycemia.  Feet: feels slight numbness in bottoms of feet most of the time but rubbing them makes the feeling come back.  No burning.  Pertinent PMH:  Past medical, surgical, social, and family history reviewed and no changes are noted since last office visit.  MEDS:  Outpatient Prescriptions Prior to Visit  Medication Sig Dispense Refill  . albuterol (PROVENTIL) (2.5 MG/3ML) 0.083% nebulizer solution Take 3 mLs (2.5 mg total) by nebulization every 4 (four) hours as needed for wheezing or shortness of breath. 75 mL 3  . ALPHAGAN P 0.1 % SOLN Place 1 drop into both eyes every 8 (eight) hours.     Marland Kitchen aspirin 81 MG tablet Take 81 mg by mouth at bedtime.     Marland Kitchen aspirin-acetaminophen-caffeine (EXCEDRIN MIGRAINE) 250-250-65 MG per tablet Take 1 tablet by mouth every 6 (six) hours as needed for headache.    Marland Kitchen atorvastatin (LIPITOR) 40 MG tablet Take 1 tablet (40 mg total) by mouth daily. 30 tablet 3  . Blood Glucose Monitoring Suppl (ONE TOUCH ULTRA 2) W/DEVICE KIT 1 each by Other route daily.    . dorzolamide (TRUSOPT) 2 % ophthalmic solution Place 1 drop into both eyes 2 (two) times daily.     . DUREZOL 0.05 % EMUL Place 1 drop into both eyes 2 (two) times daily.     . fluticasone (FLONASE) 50 MCG/ACT nasal spray Place 2 sprays into both nostrils at bedtime. (Patient taking differently: Place 2 sprays into both nostrils at bedtime as needed for allergies. ) 16 g 3  . fluticasone (FLOVENT HFA) 220 MCG/ACT inhaler Inhale 2 puffs into the lungs 2 (two) times daily. 1 Inhaler 12  . Insulin Isophane & Regular Human (HUMULIN 70/30 KWIKPEN) (70-30) 100 UNIT/ML PEN 38 U SQ qAM and 34 U SQ qPM 15 mL  12  . loratadine (CLARITIN) 10 MG tablet Take 10 mg by mouth at bedtime.     . metFORMIN (GLUCOPHAGE) 500 MG tablet Take 1 tablet (500 mg total) by mouth 2 (two) times daily with a meal. 180 tablet 1  . nitroGLYCERIN (NITROSTAT) 0.4 MG SL tablet Place 1 tablet (0.4 mg total) under the tongue every 5 (five) minutes as needed. For chest pain 50 tablet 3  . ONE TOUCH ULTRA TEST test strip TEST 3 TIMES A DAY 100 each 11  . ONETOUCH DELICA LANCETS 59D MISC TEST 3 TIMES A DAY 100 each 11  . pantoprazole (PROTONIX) 40 MG tablet TAKE ONE TABLET BY MOUTH ONCE DAILY 30 tablet 11  . PROLENSA 0.07 % SOLN Place 1 drop into both eyes at bedtime.    Marland Kitchen SIMBRINZA 1-0.2 % SUSP Place 1 drop into both eyes daily.    Marland Kitchen zoster vaccine live, PF, (ZOSTAVAX) 63875 UNT/0.65ML injection Inject 19,400 Units into the skin once. 1 each 0   No facility-administered medications prior to visit.    PE: Blood pressure 124/79, pulse 82, temperature 97.2 F (36.2 C), temperature source Oral, resp. rate 16, weight 181 lb 12.8 oz (82.464 kg). Foot exam - both normal; no swelling, tenderness or skin or vascular lesions. Color and temperature is normal. Sensation is intact.  Peripheral pulses are palpable. Toenails are normal.  LABS:  Lab Results  Component Value Date   HGBA1C 7.4* 03/03/2015   Lab Results  Component Value Date   CHOL 231* 03/03/2015   HDL 46.50 03/03/2015   LDLCALC 156* 03/03/2015   LDLDIRECT 109.8 07/06/2012   TRIG 142.0 03/03/2015   CHOLHDL 5 03/03/2015    IMPRESSION AND PLAN:  1) DM 2 with complications, hx of fair control: titrated insulin up last visit and glucoses improving. Check A1c today. Feet exam normal today.  2) Hyperlipidemia: tolerating statin fine. AST/ALT fine recently. Recheck FLP today.  An After Visit Summary was printed and given to the patient.  FOLLOW UP: 3 mo

## 2015-07-20 MED ORDER — ATORVASTATIN CALCIUM 80 MG PO TABS: 80.0000 mg | ORAL_TABLET | Freq: Every day | ORAL | Status: DC

## 2015-07-20 NOTE — Addendum Note (Signed)
Addended by: Lanae Crumbly on: 07/20/2015 10:55 AM   Modules accepted: Orders

## 2015-07-23 DIAGNOSIS — J440 Chronic obstructive pulmonary disease with acute lower respiratory infection: Secondary | ICD-10-CM | POA: Diagnosis not present

## 2015-07-27 ENCOUNTER — Encounter: Payer: Self-pay | Admitting: Family Medicine

## 2015-07-29 ENCOUNTER — Encounter: Payer: Self-pay | Admitting: Gastroenterology

## 2015-07-31 DIAGNOSIS — Z794 Long term (current) use of insulin: Secondary | ICD-10-CM | POA: Diagnosis not present

## 2015-07-31 DIAGNOSIS — H40053 Ocular hypertension, bilateral: Secondary | ICD-10-CM | POA: Diagnosis not present

## 2015-07-31 DIAGNOSIS — Z9849 Cataract extraction status, unspecified eye: Secondary | ICD-10-CM | POA: Diagnosis not present

## 2015-08-03 LAB — HM DIABETES EYE EXAM

## 2015-08-04 DIAGNOSIS — J029 Acute pharyngitis, unspecified: Secondary | ICD-10-CM | POA: Diagnosis not present

## 2015-08-06 ENCOUNTER — Encounter: Payer: Self-pay | Admitting: Family Medicine

## 2015-08-13 ENCOUNTER — Other Ambulatory Visit: Payer: Self-pay | Admitting: Family Medicine

## 2015-08-13 ENCOUNTER — Encounter (INDEPENDENT_AMBULATORY_CARE_PROVIDER_SITE_OTHER): Payer: Self-pay | Admitting: Ophthalmology

## 2015-08-20 ENCOUNTER — Encounter: Payer: Self-pay | Admitting: Family Medicine

## 2015-08-22 DIAGNOSIS — J440 Chronic obstructive pulmonary disease with acute lower respiratory infection: Secondary | ICD-10-CM | POA: Diagnosis not present

## 2015-09-11 ENCOUNTER — Ambulatory Visit: Payer: Medicare Other | Admitting: *Deleted

## 2015-09-11 VITALS — Ht 62.0 in | Wt 180.0 lb

## 2015-09-11 DIAGNOSIS — Z1211 Encounter for screening for malignant neoplasm of colon: Secondary | ICD-10-CM

## 2015-09-11 MED ORDER — NA SULFATE-K SULFATE-MG SULF 17.5-3.13-1.6 GM/177ML PO SOLN: 1.0000 | Freq: Once | ORAL | Status: DC

## 2015-09-11 NOTE — Progress Notes (Signed)
No egg or soy allergy. No anesthesia problems.  No home O2.  No diet meds.  Poor historian. Memory loss.

## 2015-09-14 ENCOUNTER — Emergency Department (HOSPITAL_BASED_OUTPATIENT_CLINIC_OR_DEPARTMENT_OTHER): Payer: Medicare Other

## 2015-09-14 ENCOUNTER — Encounter (HOSPITAL_BASED_OUTPATIENT_CLINIC_OR_DEPARTMENT_OTHER): Payer: Self-pay | Admitting: *Deleted

## 2015-09-14 ENCOUNTER — Emergency Department (HOSPITAL_BASED_OUTPATIENT_CLINIC_OR_DEPARTMENT_OTHER)
Admission: EM | Admit: 2015-09-14 | Discharge: 2015-09-14 | Disposition: A | Discharge status: Home / Self Care | Payer: Medicare Other | Attending: Emergency Medicine | Admitting: Emergency Medicine

## 2015-09-14 ENCOUNTER — Other Ambulatory Visit: Payer: Self-pay | Admitting: *Deleted

## 2015-09-14 DIAGNOSIS — I2510 Atherosclerotic heart disease of native coronary artery without angina pectoris: Secondary | ICD-10-CM | POA: Insufficient documentation

## 2015-09-14 DIAGNOSIS — Z87891 Personal history of nicotine dependence: Secondary | ICD-10-CM | POA: Insufficient documentation

## 2015-09-14 DIAGNOSIS — R911 Solitary pulmonary nodule: Secondary | ICD-10-CM | POA: Diagnosis not present

## 2015-09-14 DIAGNOSIS — K219 Gastro-esophageal reflux disease without esophagitis: Secondary | ICD-10-CM | POA: Diagnosis not present

## 2015-09-14 DIAGNOSIS — R079 Chest pain, unspecified: Secondary | ICD-10-CM

## 2015-09-14 DIAGNOSIS — Z9842 Cataract extraction status, left eye: Secondary | ICD-10-CM | POA: Diagnosis not present

## 2015-09-14 DIAGNOSIS — Z79899 Other long term (current) drug therapy: Secondary | ICD-10-CM | POA: Insufficient documentation

## 2015-09-14 DIAGNOSIS — C3490 Malignant neoplasm of unspecified part of unspecified bronchus or lung: Secondary | ICD-10-CM | POA: Diagnosis not present

## 2015-09-14 DIAGNOSIS — R0789 Other chest pain: Secondary | ICD-10-CM

## 2015-09-14 DIAGNOSIS — Z9841 Cataract extraction status, right eye: Secondary | ICD-10-CM | POA: Diagnosis not present

## 2015-09-14 DIAGNOSIS — E11319 Type 2 diabetes mellitus with unspecified diabetic retinopathy without macular edema: Secondary | ICD-10-CM | POA: Diagnosis not present

## 2015-09-14 DIAGNOSIS — Z7982 Long term (current) use of aspirin: Secondary | ICD-10-CM | POA: Diagnosis not present

## 2015-09-14 DIAGNOSIS — Z7951 Long term (current) use of inhaled steroids: Secondary | ICD-10-CM | POA: Insufficient documentation

## 2015-09-14 DIAGNOSIS — Z794 Long term (current) use of insulin: Secondary | ICD-10-CM | POA: Insufficient documentation

## 2015-09-14 DIAGNOSIS — Z9889 Other specified postprocedural states: Secondary | ICD-10-CM | POA: Diagnosis not present

## 2015-09-14 DIAGNOSIS — J449 Chronic obstructive pulmonary disease, unspecified: Secondary | ICD-10-CM | POA: Insufficient documentation

## 2015-09-14 DIAGNOSIS — E785 Hyperlipidemia, unspecified: Secondary | ICD-10-CM | POA: Insufficient documentation

## 2015-09-14 DIAGNOSIS — Z85118 Personal history of other malignant neoplasm of bronchus and lung: Secondary | ICD-10-CM | POA: Diagnosis not present

## 2015-09-14 DIAGNOSIS — M1990 Unspecified osteoarthritis, unspecified site: Secondary | ICD-10-CM | POA: Insufficient documentation

## 2015-09-14 LAB — COMPREHENSIVE METABOLIC PANEL
ALBUMIN: 3.6 g/dL (ref 3.5–5.0)
ALK PHOS: 104 U/L (ref 38–126)
ALT: 60 U/L — ABNORMAL HIGH (ref 14–54)
ANION GAP: 7 (ref 5–15)
AST: 66 U/L — ABNORMAL HIGH (ref 15–41)
BILIRUBIN TOTAL: 0.9 mg/dL (ref 0.3–1.2)
BUN: 10 mg/dL (ref 6–20)
CALCIUM: 8.8 mg/dL — AB (ref 8.9–10.3)
CO2: 28 mmol/L (ref 22–32)
Chloride: 105 mmol/L (ref 101–111)
Creatinine, Ser: 0.76 mg/dL (ref 0.44–1.00)
Glucose, Bld: 121 mg/dL — ABNORMAL HIGH (ref 65–99)
POTASSIUM: 3.4 mmol/L — AB (ref 3.5–5.1)
Sodium: 140 mmol/L (ref 135–145)
TOTAL PROTEIN: 6.9 g/dL (ref 6.5–8.1)

## 2015-09-14 LAB — CBC WITH DIFFERENTIAL/PLATELET
BASOS ABS: 0 10*3/uL (ref 0.0–0.1)
Basophils Relative: 0 %
EOS PCT: 2 %
Eosinophils Absolute: 0.2 10*3/uL (ref 0.0–0.7)
HCT: 39.9 % (ref 36.0–46.0)
Hemoglobin: 13 g/dL (ref 12.0–15.0)
LYMPHS PCT: 13 %
Lymphs Abs: 1.2 10*3/uL (ref 0.7–4.0)
MCH: 29.6 pg (ref 26.0–34.0)
MCHC: 32.6 g/dL (ref 30.0–36.0)
MCV: 90.9 fL (ref 78.0–100.0)
Monocytes Absolute: 0.8 10*3/uL (ref 0.1–1.0)
Monocytes Relative: 9 %
Neutro Abs: 6.5 10*3/uL (ref 1.7–7.7)
Neutrophils Relative %: 76 %
PLATELETS: 185 10*3/uL (ref 150–400)
RBC: 4.39 MIL/uL (ref 3.87–5.11)
RDW: 14.9 % (ref 11.5–15.5)
WBC: 8.7 10*3/uL (ref 4.0–10.5)

## 2015-09-14 LAB — D-DIMER, QUANTITATIVE (NOT AT ARMC): D DIMER QUANT: 1.09 ug{FEU}/mL — AB (ref 0.00–0.50)

## 2015-09-14 LAB — TROPONIN I: Troponin I: <0.03 ng/mL (ref <0.031)

## 2015-09-14 MED ORDER — GLUCOSE BLOOD VI STRP: ORAL_STRIP | Status: DC

## 2015-09-14 MED ORDER — ONETOUCH DELICA LANCETS 33G MISC: Status: DC

## 2015-09-14 MED ORDER — ACETAMINOPHEN 325 MG PO TABS
650.0000 mg | ORAL_TABLET | Freq: Once | ORAL | Status: AC
  Administered 2015-09-14: 650 mg via ORAL
  Filled 2015-09-14: qty 2

## 2015-09-14 MED ORDER — IOHEXOL 350 MG/ML SOLN
100.0000 mL | Freq: Once | INTRAVENOUS | Status: AC | PRN
  Administered 2015-09-14: 100 mL via INTRAVENOUS

## 2015-09-14 MED ORDER — IPRATROPIUM-ALBUTEROL 0.5-2.5 (3) MG/3ML IN SOLN
3.0000 mL | Freq: Four times a day (QID) | RESPIRATORY_TRACT | Status: DC
  Administered 2015-09-14: 3 mL via RESPIRATORY_TRACT
  Filled 2015-09-14: qty 3

## 2015-09-14 NOTE — Discharge Instructions (Signed)
Continue to take motrin and tylenol as needed for pain at home. Return without fail for worsening symptoms, including difficulty breathing, worsening pain, vomiting and unable to keep down food/fluids, or any other symptoms concerning to you.  Chest Wall Pain Chest wall pain is pain in or around the bones and muscles of your chest. Sometimes, an injury causes this pain. Sometimes, the cause may not be known. This pain may take several weeks or longer to get better. HOME CARE Pay attention to any changes in your symptoms. Take these actions to help with your pain:  Rest as told by your doctor.  Avoid activities that cause pain. Try not to use your chest, belly (abdominal), or side muscles to lift heavy things.  If directed, apply ice to the painful area:  Put ice in a plastic bag.  Place a towel between your skin and the bag.  Leave the ice on for 20 minutes, 2-3 times per day.  Take over-the-counter and prescription medicines only as told by your doctor.  Do not use tobacco products, including cigarettes, chewing tobacco, and e-cigarettes. If you need help quitting, ask your doctor.  Keep all follow-up visits as told by your doctor. This is important. GET HELP IF:  You have a fever.  Your chest pain gets worse.  You have new symptoms. GET HELP RIGHT AWAY IF:  You feel sick to your stomach (nauseous) or you throw up (vomit).  You feel sweaty or light-headed.  You have a cough with phlegm (sputum) or you cough up blood.  You are short of breath.   This information is not intended to replace advice given to you by your health care provider. Make sure you discuss any questions you have with your health care provider.   Document Released: 03/28/2008 Document Revised: 07/01/2015 Document Reviewed: 01/05/2015 Elsevier Interactive Patient Education 2016 [employer]  Nonspecific Chest Pain  Chest pain can be caused by many different conditions. There is always a chance that  your pain could be related to something serious, such as a heart attack or a blood clot in your lungs. Chest pain can also be caused by conditions that are not life-threatening. If you have chest pain, it is very important to follow up with your health care provider. CAUSES  Chest pain can be caused by:  Heartburn.  Pneumonia or bronchitis.  Anxiety or stress.  Inflammation around your heart (pericarditis) or lung (pleuritis or pleurisy).  A blood clot in your lung.  A collapsed lung (pneumothorax). It can develop suddenly on its own (spontaneous pneumothorax) or from trauma to the chest.  Shingles infection (varicella-zoster virus).  Heart attack.  Damage to the bones, muscles, and cartilage that make up your chest wall. This can include:  Bruised bones due to injury.  Strained muscles or cartilage due to frequent or repeated coughing or overwork.  Fracture to one or more ribs.  Sore cartilage due to inflammation (costochondritis). RISK FACTORS  Risk factors for chest pain may include:  Activities that increase your risk for trauma or injury to your chest.  Respiratory infections or conditions that cause frequent coughing.  Medical conditions or overeating that can cause heartburn.  Heart disease or family history of heart disease.  Conditions or health behaviors that increase your risk of developing a blood clot.  Having had chicken pox (varicella zoster). SIGNS AND SYMPTOMS Chest pain can feel like:  Burning or tingling on the surface of your chest or deep in your chest.  Crushing,  pressure, aching, or squeezing pain.  Dull or sharp pain that is worse when you move, cough, or take a deep breath.  Pain that is also felt in your back, neck, shoulder, or arm, or pain that spreads to any of these areas. Your chest pain may come and go, or it may stay constant. DIAGNOSIS Lab tests or other studies may be needed to find the cause of your pain. Your health care  provider may have you take a test called an ambulatory ECG (electrocardiogram). An ECG records your heartbeat patterns at the time the test is performed. You may also have other tests, such as:  Transthoracic echocardiogram (TTE). During echocardiography, sound waves are used to create a picture of all of the heart structures and to look at how blood flows through your heart.  Transesophageal echocardiogram (TEE).This is a more advanced imaging test that obtains images from inside your body. It allows your health care provider to see your heart in finer detail.  Cardiac monitoring. This allows your health care provider to monitor your heart rate and rhythm in real time.  Holter monitor. This is a portable device that records your heartbeat and can help to diagnose abnormal heartbeats. It allows your health care provider to track your heart activity for several days, if needed.  Stress tests. These can be done through exercise or by taking medicine that makes your heart beat more quickly.  Blood tests.  Imaging tests. TREATMENT  Your treatment depends on what is causing your chest pain. Treatment may include:  Medicines. These may include:  Acid blockers for heartburn.  Anti-inflammatory medicine.  Pain medicine for inflammatory conditions.  Antibiotic medicine, if an infection is present.  Medicines to dissolve blood clots.  Medicines to treat coronary artery disease.  Supportive care for conditions that do not require medicines. This may include:  Resting.  Applying heat or cold packs to injured areas.  Limiting activities until pain decreases. HOME CARE INSTRUCTIONS  If you were prescribed an antibiotic medicine, finish it all even if you start to feel better.  Avoid any activities that bring on chest pain.  Do not use any tobacco products, including cigarettes, chewing tobacco, or electronic cigarettes. If you need help quitting, ask your health care provider.  Do  not drink alcohol.  Take medicines only as directed by your health care provider.  Keep all follow-up visits as directed by your health care provider. This is important. This includes any further testing if your chest pain does not go away.  If heartburn is the cause for your chest pain, you may be told to keep your head raised (elevated) while sleeping. This reduces the chance that acid will go from your stomach into your esophagus.  Make lifestyle changes as directed by your health care provider. These may include:  Getting regular exercise. Ask your health care provider to suggest some activities that are safe for you.  Eating a heart-healthy diet. A registered dietitian can help you to learn healthy eating options.  Maintaining a healthy weight.  Managing diabetes, if necessary.  Reducing stress. SEEK MEDICAL CARE IF:  Your chest pain does not go away after treatment.  You have a rash with blisters on your chest.  You have a fever. SEEK IMMEDIATE MEDICAL CARE IF:   Your chest pain is worse.  You have an increasing cough, or you cough up blood.  You have severe abdominal pain.  You have severe weakness.  You faint.  You have chills.  You have sudden, unexplained chest discomfort.  You have sudden, unexplained discomfort in your arms, back, neck, or jaw.  You have shortness of breath at any time.  You suddenly start to sweat, or your skin gets clammy.  You feel nauseous or you vomit.  You suddenly feel light-headed or dizzy.  Your heart begins to beat quickly, or it feels like it is skipping beats. These symptoms may represent a serious problem that is an emergency. Do not wait to see if the symptoms will go away. Get medical help right away. Call your local emergency services (911 in the [place] Do not drive yourself to the hospital.   This information is not intended to replace advice given to you by your health care provider. Make sure you discuss any  questions you have with your health care provider.   Document Released: 07/20/2005 Document Revised: 10/31/2014 Document Reviewed: 05/16/2014 Elsevier Interactive Patient Education [employer].

## 2015-09-14 NOTE — ED Provider Notes (Signed)
CSN: [id number]     Arrival date & time 09/14/15  1007 History   First MD Initiated Contact with Patient 09/14/15 1039     Chief Complaint  Patient presents with  . Chest Pain     (Consider location/radiation/quality/duration/timing/severity/associated sxs/prior Treatment) HPI 69 year old female who presents with chest pain. History of diabetes, COPD, former tobacco use, hypertension, hyperlipidemia, and history of lung cancer status post chemotherapy radiation, currently in remission. States that over the weekend, she was sick with GI illness. Had multiple episodes of diarrhea with vomiting and cough. The symptoms had resolved, but yesterday evening, noticed a squeezing chest pain that radiated across her lower anterior chest wall. States that pain is sharp in nature, worse when she takes a deep breath in. Worse with movement of her torso and position changes. At baseline is very short of breath, but does not take any home oxygen. No worsening dyspnea. Denies any sputum production, fevers, chills, abdominal pain, or urinary complaints. Not very mobile at baseline, but denies leg swelling or pain.  Past Medical History  Diagnosis Date  . Diabetes mellitus with complication (HCC)     diab retinopathy OU (laser)  . COPD (chronic obstructive pulmonary disease) ([place])   . History of diverticulitis of colon   . CAD (coronary artery disease)     NON obstructive. Cath 2006 preserved LV fxn, scattered irregularities without critical stenosis. 2008 stress echo negative for ischemia, but with hypertensive response  . Hyperlipidemia   . Mild cognitive impairment with memory loss     Likely from brain radiation therapy  . Bilateral diabetic retinopathy (HCC) 2015    Dr. Zigmund Daniel  . 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 Shirley Muscat, OD  . Lung cancer ([place])     non-small cell lung ca, stage III in 05/2011; systemic chemotherapy concurrent with radiation followed by prophylactic cranial irradiation and  has been observation since July of 2010 with no evidence for disease recurrence-released from onc f/u 12/2014 (needs annual cxr by PCP)  . Allergy     seasonal  . Arthritis     hands  . Blood transfusion without reported diagnosis     when taking chemo  . GERD (gastroesophageal reflux disease)     protonix  . Age-related nuclear cataract of both eyes 2016    +cortical age related cataracts OU    Past Surgical History  Procedure Laterality Date  . Appendectomy    . Abdominal hysterectomy  1997  . Carpal tunnel release    . Left heart catheterization with coronary angiogram N/A 05/30/2012    Procedure: LEFT HEART CATHETERIZATION WITH CORONARY ANGIOGRAM;  Surgeon: Burnell Blanks, MD;  Location: [facility] CATH LAB;  Service: Cardiovascular;  Laterality: N/A;  . Colonoscopy  10/24/00    normal.  [employer] hemoccult testing via [facility] 06/18/15 was NEG  . Cholecystectomy  2002  . Cataract extraction     Family History  Problem Relation Age of Onset  . Heart failure Mother   . Kidney disease Mother     renal failure  . Diabetes Mother   . Diabetes Father   . Diabetes Brother   . Heart disease Brother   . Heart attack Brother   . Heart attack Brother   . Heart attack Brother   . Colon cancer Neg Hx    Social History  Substance Use Topics  . Smoking status: Former Smoker -- 1.00 packs/day for 45 years    Types: Cigarettes  Quit date: 10/24/2008  . Smokeless tobacco: Never Used  . Alcohol Use: No   OB History    No data available     Review of Systems 10/14 systems reviewed and are negative other than those stated in the HPI   Allergies  Lactose intolerance (gi) and Ibuprofen  Home Medications   Prior to Admission medications   Medication Sig Start Date End Date Taking? Authorizing Provider  albuterol (PROVENTIL) (2.5 MG/3ML) 0.083% nebulizer solution Take 3 mLs (2.5 mg total) by nebulization every 4 (four) hours as needed for wheezing or shortness of breath. 10/31/14    [name], MD  ALPHAGAN P 0.1 % SOLN Place 1 drop into both eyes every 8 (eight) hours.  12/31/14   Historical Provider, MD  aspirin 81 MG tablet Take 81 mg by mouth at bedtime.     Historical Provider, MD  aspirin-acetaminophen-caffeine (EXCEDRIN MIGRAINE) [phone] MG per tablet Take 1 tablet by mouth every 6 (six) hours as needed for headache.    Historical Provider, MD  atorvastatin (LIPITOR) 80 MG tablet Take 1 tablet (80 mg total) by mouth daily. 07/20/15   [name], MD  Blood Glucose Monitoring Suppl (ONE TOUCH ULTRA 2) W/DEVICE KIT 1 each by Other route daily. 09/16/13   Historical Provider, MD  dorzolamide (TRUSOPT) 2 % ophthalmic solution Place 1 drop into both eyes 2 (two) times daily.  12/29/14   Historical Provider, MD  DUREZOL 0.05 % EMUL Place 1 drop into both eyes 2 (two) times daily.  12/23/14   Historical Provider, MD  fluticasone (FLONASE) 50 MCG/ACT nasal spray Place 2 sprays into both nostrils at bedtime. Patient taking differently: Place 2 sprays into both nostrils at bedtime as needed for allergies.  06/25/14   [name], MD  fluticasone (FLOVENT HFA) 220 MCG/ACT inhaler Inhale 2 puffs into the lungs 2 (two) times daily. 08/26/14   [name], MD  glucose blood (ONE TOUCH ULTRA TEST) test strip TEST 3 TIMES A DAY 09/14/15   [name], MD  Insulin Isophane & Regular Human (HUMULIN 70/30 KWIKPEN) (70-30) 100 UNIT/ML PEN 38 U SQ qAM and 34 U SQ qPM 07/03/15   [name], MD  loratadine (CLARITIN) 10 MG tablet Take 10 mg by mouth at bedtime.     Historical Provider, MD  metFORMIN (GLUCOPHAGE) 500 MG tablet Take 1 tablet (500 mg total) by mouth 2 (two) times daily with a meal. 12/19/14   [name], MD  Na Sulfate-K Sulfate-Mg Sulf (SUPREP BOWEL PREP) SOLN Take 1 kit by mouth once. Name brand only, suprep as directed, no substitutions 09/11/15   Milus Banister, MD  nitroGLYCERIN (NITROSTAT) 0.4 MG SL tablet Place 1 tablet (0.4 mg total) under the  tongue every 5 (five) minutes as needed. For chest pain 12/31/12   Lisabeth Pick, MD  [facility] DELICA LANCETS 38S MISC TEST 3 TIMES A DAY 09/14/15   [name], MD  pantoprazole (PROTONIX) 40 MG tablet TAKE ONE TABLET BY MOUTH ONCE DAILY 05/13/14   [name], MD   BP 126/68 mmHg  Pulse 101  Temp(Src) 98.6 F (37 C) (Oral)  Resp 24  SpO2 93% Physical Exam Physical Exam  Nursing note and vitals reviewed. Constitutional: Chronically ill appearing, non-toxic, and in no acute distress Head: Normocephalic and atraumatic.  Mouth/Throat: Oropharynx is clear and moist.  Neck: Normal range of motion. Neck supple.  Cardiovascular: Tachycardic rate and regular rhythm.  No edema.  Pulmonary/Chest: Tachypnea with increased work of breathing with minimal activity, but resolves with rest. No chest wall tenderness. Scattered wheeze throughout all lung fields. Abdominal: Soft. There is no tenderness. There is no rebound and no guarding.  Musculoskeletal: Normal range of motion. No calf tenderness. Neurological: Alert, no facial droop, fluent speech, moves all extremities symmetrically Skin: Skin is warm and dry.  Psychiatric: Cooperative  ED Course  Procedures (including critical care time) Labs Review Labs Reviewed  COMPREHENSIVE METABOLIC PANEL - Abnormal; Notable for the following:    Potassium 3.4 (*)    Glucose, Bld 121 (*)    Calcium 8.8 (*)    AST 66 (*)    ALT 60 (*)    All other components within normal limits  D-DIMER, QUANTITATIVE (NOT AT [facility]) - Abnormal; Notable for the following:    D-Dimer, Quant 1.09 (*)    All other components within normal limits  CBC WITH DIFFERENTIAL/PLATELET  TROPONIN I  TROPONIN I    Imaging Review Dg Chest 2 View  09/14/2015  CLINICAL DATA:  Left side chest pain EXAM: CHEST  2 VIEW COMPARISON:  01/15/2015 and 11/15/2013 FINDINGS: Cardiomediastinal silhouette is unremarkable. Stable postradiation changes in right upper lobe and right  suprahilar region. No definite superimposed infiltrate or pulmonary edema. Left lung is clear. Bony thorax is unremarkable. IMPRESSION: No active cardiopulmonary disease. Stable postradiation changes in right upper lobe and right suprahilar region. Electronically Signed   By: [name] M.D.   On: 09/14/2015 11:14   Ct Angio Chest Pe W/cm &/or Wo Cm  09/14/2015  CLINICAL DATA:  69 year old female with history of non-small cell lung cancer status post chemotherapy and radiation therapy presenting with several day history of chest pain, cough and congestion. EXAM: CT ANGIOGRAPHY CHEST WITH CONTRAST TECHNIQUE: Multidetector CT imaging of the chest was performed using the standard protocol during bolus administration of intravenous contrast. Multiplanar CT image reconstructions and MIPs were obtained to evaluate the vascular anatomy. CONTRAST:  142m OMNIPAQUE IOHEXOL 350 MG/ML SOLN COMPARISON:  Chest CT 01/15/2015. FINDINGS: Comment: Contrast bolus is suboptimal in that the image acquisition was timed such that the upper lung pulmonary arteries are incompletely opacified. Mediastinum/Lymph Nodes: From the level of the carina cephalad the upper lung pulmonary arteries are not evaluable on today's PE protocol CT scan secondary to suboptimal contrast bolus timing. There is no central pulmonary embolism. There is also very poor opacification of the distal right main pulmonary artery, with extensive mixing between opacified and non-opacified blood, however, no definite embolus is identified in this region. Throughout the mid to lower lungs there are no filling defects in the pulmonary arteries to suggest embolus. Heart size is normal. Small amount of pericardial fluid, slightly increased compared to the prior study, but unlikely to be of any hemodynamic significance at this time. No associated pericardial calcification. Prominent soft tissue around the right hilar region, similar to the prior study, presumably  predominantly postradiation scarring. Otherwise, there is no definite lymphadenopathy noted in the mediastinal or left hilar regions. Esophagus is unremarkable in appearance. No axillary lymphadenopathy. Lungs/Pleura: The new complete chronic collapse/consolidation of the right upper lobe, most compatible with postradiation mass-like fibrosis. Areas of cylindrical and varicose bronchiectasis throughout the right upper lobe. Compensatory hyperexpansion of the right middle and lower lobes. Trace partially loculated right apical pleural effusion, similar to the prior study. 11 x 9 mm appear ground-glass attenuation nodule in the left upper lobe (image 20 of series 6), similar to prior examinations. No  new suspicious appearing pulmonary nodules or masses. No acute consolidative airspace disease. Upper Abdomen: Status post cholecystectomy. Musculoskeletal/Soft Tissues: There are no aggressive appearing lytic or blastic lesions noted in the visualized portions of the skeleton. Review of the MIP images confirms the above findings. IMPRESSION: 1. Study is partially limited by suboptimal contrast bolus, as discussed above. Within the limitations of today's examination, there is no definite pulmonary embolism identified. 2. Slight interval increase in small chronic pericardial effusion, which is unlikely to be of hemodynamic significance at this time. 3. Chronic postradiation changes in the right hemithorax redemonstrated, similar to prior examinations. No definite findings to suggest local recurrence of disease. 4. Stable 9 x 11 mm ground-glass attenuation nodule in the left upper lobe (image 20 of series 6). Continued attention on repeat noncontrast chest CT is recommended in 12 months to ensure stability of this lesion. Electronically Signed   By: Vinnie Langton M.D.   On: 09/14/2015 13:20     I have personally reviewed and evaluated these images and lab results as part of my medical decision-making.   EKG  Interpretation   Date/Time:  Monday September 14 2015 10:22:37 EST Ventricular Rate:  99 PR Interval:  154 QRS Duration: 88 QT Interval:  384 QTC Calculation: 492 R Axis:   86 Text Interpretation:  Normal sinus rhythm Anterior infarct , age  undetermined Abnormal ECG No significant change since last tracing  Confirmed by Zoa Dowty MD, Courtney Fenlon [phone]) on 09/14/2015 10:44:26 AM      MDM   Final diagnoses:  Chest pain  Chest wall pain    69 year old female who presents with chest pain. Chronically appearing, and appears dyspneic with minimal exertion, which she and family states is her normal. Oxygenates well and breaths comfortably on room air at rest. Scattered wheezes on lung exam. Does not appear fluid overloaded. EKG without acute ischemia/infarction or strain. A d-dimer was sent given pleuritic nature of pain, prior malignancy history, and immobilization at home. Elevated and CT PE performed, showing no PE or other acute cardiopulmonary processes. Negative serial troponins and EKGs without dynamic changes. Description of pain seems MSK given it only comes on with movement or sitting up in bed while in the ED (and history of straining with vomiting and coughing recently), but has many risk factors with ACS. Discussed admission for observation for full cardiac rule out. Family states she had similar presentation in 2013, which she was admitted and had LHC showing non-obstructive CAD. THe patient at this time requests discharge home in lieu of 1-2 day follow-up with PCP.     Forde Dandy, MD 09/15/15 1059

## 2015-09-14 NOTE — ED Notes (Signed)
Pt to triage in w/c, reports left sided chest pain x last night. No increased sob, states "I"m always sob because I"m a lung cancer survivor..." pt reports n/v last night.

## 2015-09-14 NOTE — Telephone Encounter (Signed)
LOV: 07/17/15 NOV: 11/04/15  RF request for lancets Last written: 08/21/14 #100 w/ 11RF  RF request for test strips Last written: 09/12/14 #100 w/ 11RF

## 2015-09-15 ENCOUNTER — Telehealth: Payer: Self-pay | Admitting: Family Medicine

## 2015-09-15 NOTE — Telephone Encounter (Signed)
OK, if she is doing well now then nothing further recommended at this time.-thx

## 2015-09-15 NOTE — Telephone Encounter (Signed)
Patient was having chest pains with wheezing, went to [facility]. They did not admit patient. She was advised to call our office to let us know. Please advise

## 2015-09-15 NOTE — Telephone Encounter (Signed)
Spoke to pt and her husband. He stated that she went to the ER yesterday (09/14/15) and they ran a series of test but all were normal. She was kept for observation for 7 hours then d/c home with d/c dx of chest wall pain due to coughing. They were advised to contact her PCP to determine in f/u was necessary. He stated that he pain has subsided and she is doing well. Please advise. Thanks.

## 2015-09-22 DIAGNOSIS — J440 Chronic obstructive pulmonary disease with acute lower respiratory infection: Secondary | ICD-10-CM | POA: Diagnosis not present

## 2015-09-24 ENCOUNTER — Other Ambulatory Visit: Payer: Self-pay | Admitting: Family Medicine

## 2015-09-24 ENCOUNTER — Encounter: Payer: Self-pay | Admitting: Gastroenterology

## 2015-09-25 ENCOUNTER — Telehealth: Payer: Self-pay | Admitting: Gastroenterology

## 2015-09-25 MED ORDER — NA SULFATE-K SULFATE-MG SULF 17.5-3.13-1.6 GM/177ML PO SOLN: 1.0000 | Freq: Once | ORAL | Status: DC

## 2015-09-25 NOTE — Telephone Encounter (Signed)
LM that prep has been resent to [facility] [employer] and to call with further questions or issues   Marijean Niemann

## 2015-09-29 DIAGNOSIS — Z961 Presence of intraocular lens: Secondary | ICD-10-CM | POA: Diagnosis not present

## 2015-09-29 DIAGNOSIS — H40053 Ocular hypertension, bilateral: Secondary | ICD-10-CM | POA: Diagnosis not present

## 2015-10-02 ENCOUNTER — Ambulatory Visit (AMBULATORY_SURGERY_CENTER): Payer: Medicare Other | Admitting: Gastroenterology

## 2015-10-02 ENCOUNTER — Encounter: Payer: Self-pay | Admitting: Gastroenterology

## 2015-10-02 VITALS — BP 117/69 | HR 84 | Temp 96.7°F | Resp 22 | Ht 62.0 in | Wt 180.0 lb

## 2015-10-02 DIAGNOSIS — Z1211 Encounter for screening for malignant neoplasm of colon: Secondary | ICD-10-CM | POA: Diagnosis present

## 2015-10-02 DIAGNOSIS — D123 Benign neoplasm of transverse colon: Secondary | ICD-10-CM

## 2015-10-02 DIAGNOSIS — D120 Benign neoplasm of cecum: Secondary | ICD-10-CM | POA: Diagnosis not present

## 2015-10-02 LAB — GLUCOSE, CAPILLARY
GLUCOSE-CAPILLARY: 137 mg/dL — AB (ref 65–99)
GLUCOSE-CAPILLARY: 133 mg/dL — AB (ref 65–99)

## 2015-10-02 MED ORDER — SODIUM CHLORIDE 0.9 % IV SOLN: 500.0000 mL | INTRAVENOUS | Status: DC

## 2015-10-02 NOTE — Progress Notes (Signed)
To recovery, report To Mrs Harden Mo, Therapist, sports, VSS

## 2015-10-02 NOTE — Patient Instructions (Signed)
YOU HAD AN ENDOSCOPIC PROCEDURE TODAY AT [facility]:   Refer to the procedure report that was given to you for any specific questions about what was found during the examination.  If the procedure report does not answer your questions, please call your gastroenterologist to clarify.  If you requested that your care partner not be given the details of your procedure findings, then the procedure report has been included in a sealed envelope for you to review at your convenience later.  YOU SHOULD EXPECT: Some feelings of bloating in the abdomen. Passage of more gas than usual.  Walking can help get rid of the air that was put into your GI tract during the procedure and reduce the bloating. If you had a lower endoscopy (such as a colonoscopy or flexible sigmoidoscopy) you may notice spotting of blood in your stool or on the toilet paper. If you underwent a bowel prep for your procedure, you may not have a normal bowel movement for a few days.  Please Note:  You might notice some irritation and congestion in your nose or some drainage.  This is from the oxygen used during your procedure.  There is no need for concern and it should clear up in a day or so.  SYMPTOMS TO REPORT IMMEDIATELY:   Following lower endoscopy (colonoscopy or flexible sigmoidoscopy):  Excessive amounts of blood in the stool  Significant tenderness or worsening of abdominal pains  Swelling of the abdomen that is new, acute  Fever of 100F or higher  For urgent or emergent issues, a gastroenterologist can be reached at any hour by calling [phone].   DIET: Your first meal following the procedure should be a small meal and then it is ok to progress to your normal diet. Heavy or fried foods are harder to digest and may make you feel nauseous or bloated.  Likewise, meals heavy in dairy and vegetables can increase bloating.  Drink plenty of fluids but you should avoid alcoholic beverages for 24  hours.  ACTIVITY:  You should plan to take it easy for the rest of today and you should NOT DRIVE or use heavy machinery until tomorrow (because of the sedation medicines used during the test).    FOLLOW UP: Our staff will call the number listed on your records the next business day following your procedure to check on you and address any questions or concerns that you may have regarding the information given to you following your procedure. If we do not reach you, we will leave a message.  However, if you are feeling well and you are not experiencing any problems, there is no need to return our call.  We will assume that you have returned to your regular daily activities without incident.  If any biopsies were taken you will be contacted by phone or by letter within the next 1-3 weeks.  Please call us at [phone] if you have not heard about the biopsies in 3 weeks.    SIGNATURES/CONFIDENTIALITY: You and/or your care partner have signed paperwork which will be entered into your electronic medical record.  These signatures attest to the fact that that the information above on your After Visit Summary has been reviewed and is understood.  Full responsibility of the confidentiality of this discharge information lies with you and/or your care-partner.  Please continue your normal medications  Please read over handouts about polyps, diverticulosis and high fiber diets

## 2015-10-02 NOTE — Op Note (Signed)
[place]  [address]. [place], [address]   COLONOSCOPY PROCEDURE REPORT  PATIENT: [name], [name]  MR#: [id number] BIRTHDATE: 03-15-1946 , 81  yrs. old GENDER: female ENDOSCOPIST: Milus Banister, MD PROCEDURE DATE:  10/02/2015 PROCEDURE:   Colonoscopy, screening and Colonoscopy with snare polypectomy First Screening Colonoscopy - Avg.  risk and is 50 yrs.  old or older - No.  Prior Negative Screening - Now for repeat screening. 10 or more years since last screening  History of Adenoma - Now for follow-up colonoscopy & has been > or = to 3 yrs.  N/A  Polyps removed today? Yes ASA CLASS:   Class II INDICATIONS:Screening for colonic neoplasia, Colorectal Neoplasm Risk Assessment for this procedure is average risk, and Colonoscopy 2006 Dr.  Ardis Hughs, no polyps. MEDICATIONS: Monitored anesthesia care and Propofol 200 mg IV  DESCRIPTION OF PROCEDURE:   After the risks benefits and alternatives of the procedure were thoroughly explained, informed consent was obtained.  The digital rectal exam revealed no abnormalities of the rectum.   The LB [id number]  endoscope was introduced through the anus and advanced to the cecum, which was identified by both the appendix and ileocecal valve. No adverse events experienced.   The quality of the prep was excellent.  The instrument was then slowly withdrawn as the colon was fully examined. Estimated blood loss is zero unless otherwise noted in this procedure report.   COLON FINDINGS: Two sessile polyps ranging between 3-41m in size were found in the transverse colon and at the cecum.  Polypectomies were performed with a cold snare.  The resection was complete, the polyp tissue was completely retrieved and sent to histology. There was mild diverticulosis noted in the left colon.   The examination was otherwise normal.  Retroflexed views revealed no abnormalities. The time to cecum = 2.7 Withdrawal time = 12.1    The scope was withdrawn and the procedure completed. COMPLICATIONS: There were no immediate complications.  ENDOSCOPIC IMPRESSION: 1.   Two sessile polyps ranging between 3-534min size were found in the transverse colon and at the cecum; polypectomies were performed with a cold snare 2.   Mild diverticulosis was noted in the left colon 3.   The examination was otherwise normal  RECOMMENDATIONS: If the polyp(s) removed today are proven to be adenomatous (pre-cancerous) polyps, you will need a repeat colonoscopy in 5 years.  Otherwise you should continue to follow colorectal cancer screening guidelines for "routine risk" patients with colonoscopy in 10 years.  You will receive a letter within 1-2 weeks with the results of your biopsy as well as final recommendations.  Please call my office if you have not received a letter after 3 weeks.  eSigned:  DaMilus BanisterMD 10/02/2015 9:21 AM

## 2015-10-02 NOTE — Progress Notes (Signed)
Called to room to assist during endoscopic procedure.  Patient ID and intended procedure confirmed with present staff. Received instructions for my participation in the procedure from the performing physician.  

## 2015-10-05 ENCOUNTER — Telehealth: Payer: Self-pay

## 2015-10-05 NOTE — Telephone Encounter (Signed)
  Follow up Call-  Call back number 10/02/2015  Post procedure Call Back phone  # [phone]  Permission to leave phone message Yes     Patient questions:  Do you have a fever, pain , or abdominal swelling? No. Pain Score  0 *  Have you tolerated food without any problems? Yes.    Have you been able to return to your normal activities? Yes.    Do you have any questions about your discharge instructions: Diet   No. Medications  No. Follow up visit  No.  Do you have questions or concerns about your Care? No.  Actions: * If pain score is 4 or above: No action needed, pain <4.  No problems per the pt. maw

## 2015-10-07 ENCOUNTER — Encounter: Payer: Self-pay | Admitting: Gastroenterology

## 2015-10-14 ENCOUNTER — Other Ambulatory Visit: Payer: Self-pay | Admitting: Family Medicine

## 2015-10-15 ENCOUNTER — Other Ambulatory Visit: Payer: Self-pay | Admitting: Family Medicine

## 2015-10-15 NOTE — Telephone Encounter (Signed)
RF request for pantoprazole LOV: 07/17/15 Next ov: 11/04/15 Last written: 05/13/14 #30 w/ 11RF

## 2015-11-02 ENCOUNTER — Ambulatory Visit (INDEPENDENT_AMBULATORY_CARE_PROVIDER_SITE_OTHER): Payer: Self-pay | Admitting: Ophthalmology

## 2015-11-04 ENCOUNTER — Ambulatory Visit (INDEPENDENT_AMBULATORY_CARE_PROVIDER_SITE_OTHER): Payer: Medicare Other | Admitting: Family Medicine

## 2015-11-04 ENCOUNTER — Encounter: Payer: Self-pay | Admitting: Family Medicine

## 2015-11-04 VITALS — BP 138/79 | HR 74 | Temp 97.3°F | Resp 16 | Ht 62.0 in | Wt 176.5 lb

## 2015-11-04 DIAGNOSIS — J438 Other emphysema: Secondary | ICD-10-CM | POA: Diagnosis not present

## 2015-11-04 DIAGNOSIS — E118 Type 2 diabetes mellitus with unspecified complications: Secondary | ICD-10-CM | POA: Diagnosis not present

## 2015-11-04 DIAGNOSIS — E785 Hyperlipidemia, unspecified: Secondary | ICD-10-CM

## 2015-11-04 DIAGNOSIS — R740 Nonspecific elevation of levels of transaminase and lactic acid dehydrogenase [LDH]: Secondary | ICD-10-CM | POA: Diagnosis not present

## 2015-11-04 DIAGNOSIS — R7401 Elevation of levels of liver transaminase levels: Secondary | ICD-10-CM

## 2015-11-04 LAB — HEPATIC FUNCTION PANEL
ALK PHOS: 115 U/L (ref 39–117)
ALT: 21 U/L (ref 0–35)
AST: 22 U/L (ref 0–37)
Albumin: 4.3 g/dL (ref 3.5–5.2)
BILIRUBIN TOTAL: 0.5 mg/dL (ref 0.2–1.2)
Bilirubin, Direct: 0.1 mg/dL (ref 0.0–0.3)
Total Protein: 6.9 g/dL (ref 6.0–8.3)

## 2015-11-04 LAB — HEMOGLOBIN A1C: Hgb A1c MFr Bld: 7.3 % — ABNORMAL HIGH (ref 4.6–6.5)

## 2015-11-04 NOTE — Progress Notes (Signed)
Pre visit review using our clinic review tool, if applicable. No additional management support is needed unless otherwise documented below in the visit note. 

## 2015-11-04 NOTE — Progress Notes (Signed)
OFFICE VISIT  11/04/2015   CC:  Chief Complaint  Patient presents with  . Follow-up    Pt is not fasting.      HPI:    Patient is a 70 y.o. Caucasian female who presents for 4 mo f/u DM 2, HLD, COPD. Went to ED 09/14/15 for CP, ruled out for ACS and also had neg CT angio of chest.  Musculoskeletal CP dx'd and pt was feeling better at d/c and was w/out sx's the next day.  Her AST and ALT during that visit were elevated into the 60s.  I had increased her atorv to 57m qd a couple of months prior to these labs. She continues to be compliant with her atorv.  Says glucoses are "good".  Recalls a couple numbers: 104, 126.  Rare 200 reading "when I went out and ate too much.  Reports compliance with her insulins and metformin.  Breathing feels good.  No CP, no dizziness, no palpitations.   Past Medical History  Diagnosis Date  . Diabetes mellitus with complication (HCC)     diab retinopathy OU (laser)  . COPD (chronic obstructive pulmonary disease) ([place]   . History of diverticulitis of colon   . CAD (coronary artery disease)     NON obstructive. Cath 2006 preserved LV fxn, scattered irregularities without critical stenosis. 2008 stress echo negative for ischemia, but with hypertensive response  . Hyperlipidemia   . Mild cognitive impairment with memory loss     Likely from brain radiation therapy  . Bilateral diabetic retinopathy (HCC) 2015    Dr. MZigmund Daniel . 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 BShirley Muscat OD  . [place]     non-small cell lung ca, stage III in 05/2011; systemic chemotherapy concurrent with radiation followed by prophylactic cranial irradiation and has been observation since July of 2010 with no evidence for disease recurrence-released from onc f/u 12/2014 (needs annual cxr by PCP).  CXR 08/2015 stable.  . Allergy     seasonal  . Arthritis     hands  . Blood transfusion without reported diagnosis     when taking chemo  . GERD (gastroesophageal reflux  disease)     protonix  . Age-related nuclear cataract of both eyes 2016    +cortical age related cataracts OU     Past Surgical History  Procedure Laterality Date  . Appendectomy    . Abdominal hysterectomy  1997  . Carpal tunnel release    . Left heart catheterization with coronary angiogram N/A 05/30/2012    Procedure: LEFT HEART CATHETERIZATION WITH CORONARY ANGIOGRAM;  Surgeon: CBurnell Blanks MD;  [facility];  Service: Cardiovascular;  Laterality: N/A;  . Colonoscopy  10/24/00    normal.  [employer] hemoccult testing via [facility] 06/18/15 was NEG  . Cholecystectomy  2002  . Cataract extraction      Outpatient Prescriptions Prior to Visit  Medication Sig Dispense Refill  . albuterol (PROVENTIL) (2.5 MG/3ML) 0.083% nebulizer solution Take 3 mLs (2.5 mg total) by nebulization every 4 (four) hours as needed for wheezing or shortness of breath. 75 mL 3  . ALPHAGAN P 0.1 % SOLN Place 1 drop into both eyes every 8 (eight) hours.     .Marland Kitchenaspirin 81 MG tablet Take 81 mg by mouth at bedtime.     .Marland Kitchenaspirin-acetaminophen-caffeine (EXCEDRIN MIGRAINE) 250-250-65 MG per tablet Take 1 tablet by mouth every 6 (six) hours as needed for headache.    .Marland Kitchen  atorvastatin (LIPITOR) 80 MG tablet Take 1 tablet (80 mg total) by mouth daily. 90 tablet 1  . Blood Glucose Monitoring Suppl (ONE TOUCH ULTRA 2) W/DEVICE KIT 1 each by Other route daily.    . dorzolamide (TRUSOPT) 2 % ophthalmic solution Place 1 drop into both eyes 2 (two) times daily.     . DUREZOL 0.05 % EMUL Place 1 drop into both eyes 2 (two) times daily.     . fluticasone (FLONASE) 50 MCG/ACT nasal spray Place 2 sprays into both nostrils at bedtime. (Patient taking differently: Place 2 sprays into both nostrils at bedtime as needed for allergies. ) 16 g 3  . fluticasone (FLOVENT HFA) 220 MCG/ACT inhaler Inhale 2 puffs into the lungs 2 (two) times daily. 1 Inhaler 12  . glucose blood (ONE TOUCH ULTRA TEST) test strip TEST 3 TIMES A DAY 100  each 11  . Insulin Isophane & Regular Human (HUMULIN 70/30 KWIKPEN) (70-30) 100 UNIT/ML PEN 38 U SQ qAM and 34 U SQ qPM 15 mL 12  . loratadine (CLARITIN) 10 MG tablet Take 10 mg by mouth at bedtime.     . metFORMIN (GLUCOPHAGE) 500 MG tablet TAKE ONE TABLET BY MOUTH TWICE DAILY WITH A MEAL 180 tablet 0  . nitroGLYCERIN (NITROSTAT) 0.4 MG SL tablet Place 1 tablet (0.4 mg total) under the tongue every 5 (five) minutes as needed. For chest pain 50 tablet 3  . ONETOUCH DELICA LANCETS 33G MISC TEST 3 TIMES A DAY 100 each 11  . pantoprazole (PROTONIX) 40 MG tablet TAKE ONE TABLET BY MOUTH ONCE DAILY 30 tablet 12  . pantoprazole (PROTONIX) 40 MG tablet TAKE ONE TABLET BY MOUTH ONCE DAILY (Patient not taking: Reported on 11/04/2015) 30 tablet 11   No facility-administered medications prior to visit.    Allergies  Allergen Reactions  . Lactose Intolerance (Gi) Diarrhea  . Ibuprofen     REACTION: Anxiousness, hypoventilate     ROS As per HPI  PE: Blood pressure 138/79, pulse 74, temperature 97.3 F (36.3 C), temperature source Oral, resp. rate 16, height 5\' 2"  (1.575 m), weight 176 lb 8 oz (80.06 kg), SpO2 95 %. Gen: Alert, well appearing.  Patient is oriented to person, place, time, and situation. CV: RRR, no m/r/g.   LUNGS: CTA bilat, nonlabored resps, good aeration in all lung fields. EXT: no clubbing, cyanosis, or edema.   LABS:  Lab Results  Component Value Date   TSH 0.49 07/01/2011   Lab Results  Component Value Date   WBC 8.7 09/14/2015   HGB 13.0 09/14/2015   HCT 39.9 09/14/2015   MCV 90.9 09/14/2015   PLT 185 09/14/2015   Lab Results  Component Value Date   CREATININE 0.76 09/14/2015   BUN 10 09/14/2015   NA 140 09/14/2015   K 3.4* 09/14/2015   CL 105 09/14/2015   CO2 28 09/14/2015   Lab Results  Component Value Date   ALT 60* 09/14/2015   AST 66* 09/14/2015   ALKPHOS 104 09/14/2015   BILITOT 0.9 09/14/2015   Lab Results  Component Value Date   CHOL 213*  07/17/2015   Lab Results  Component Value Date   HDL 32.50* 07/17/2015   Lab Results  Component Value Date   LDLCALC 156* 03/03/2015   Lab Results  Component Value Date   TRIG 212.0* 07/17/2015   Lab Results  Component Value Date   CHOLHDL 7 07/17/2015   Lab Results  Component Value Date   HGBA1C 7.4*  07/17/2015    IMPRESSION AND PLAN:  1) DM 2, control sounds decent but pt's report of home glucoses is not entirely reliable. Check HbA1c today.  2) HLD: not fasting today so can't recheck lipids. Depending on what her LFTs are today, we'll decide about how to proceed with her statin.  3) Elevated transaminase levels; very mild.  Recheck these today.  Could be statin related.  4) COPD; The current medical regimen is effective;  continue present plan and medications.  An After Visit Summary was printed and given to the patient.  FOLLOW UP: Return in about 4 months (around 03/03/2016) for routine chronic illness f/u.

## 2015-11-18 IMAGING — CT CT CHEST W/O CM
2 of 4 series · 15 of 36 positions shown, 18 images · non-contrast
Comparison: 01/10/2014

CLINICAL DATA: Followup lung cancer

EXAM:
CT CHEST WITHOUT CONTRAST
TECHNIQUE: Multidetector CT imaging of the chest was performed following the
standard protocol without IV contrast..

[Series 2: chest w/o st · axial · non-contrast · 0.74mm/px · z∈[-346,-100]mm · 12 of 59 slices shown, 15 images]
[im 5/59  mediastinal]
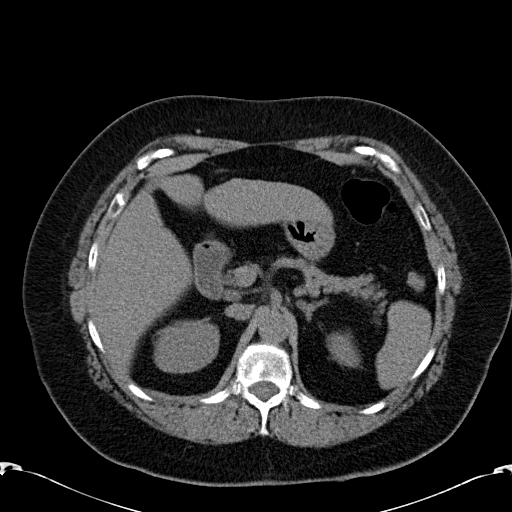
[im 5/59  lung]
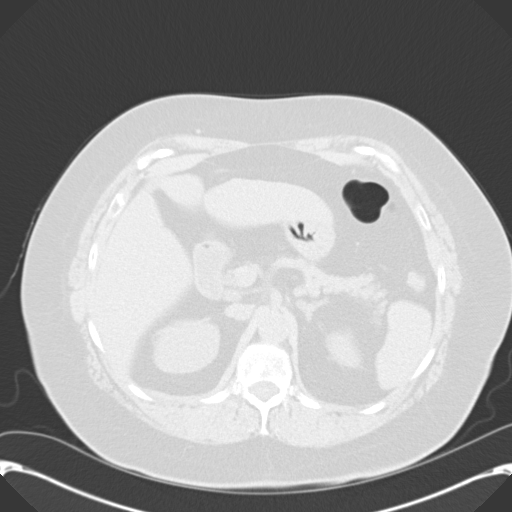
[im 9/59  lung]
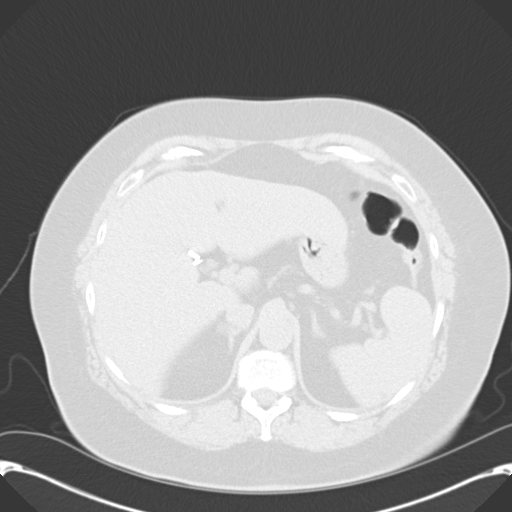
[im 13/59  lung]
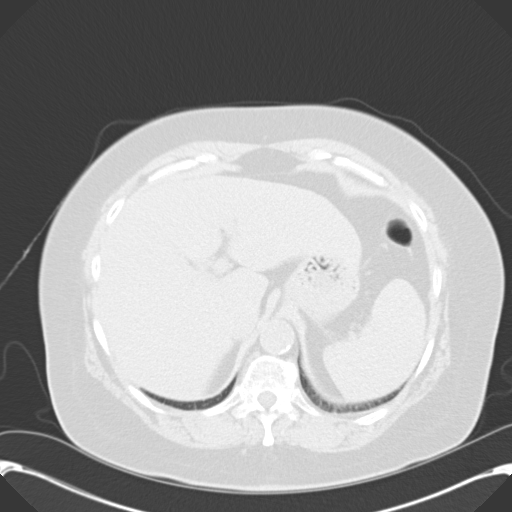
[im 17/59  lung]
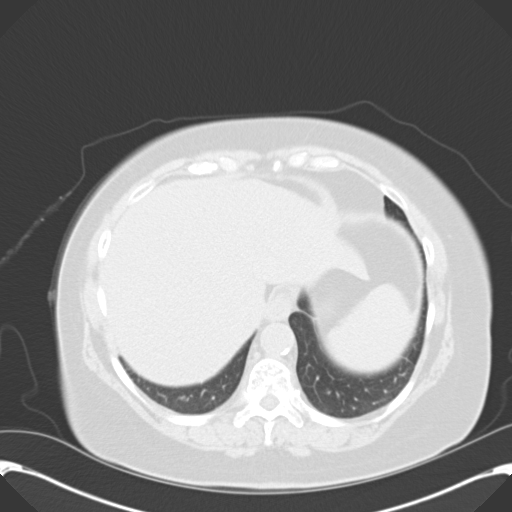
[im 21/59  mediastinal]
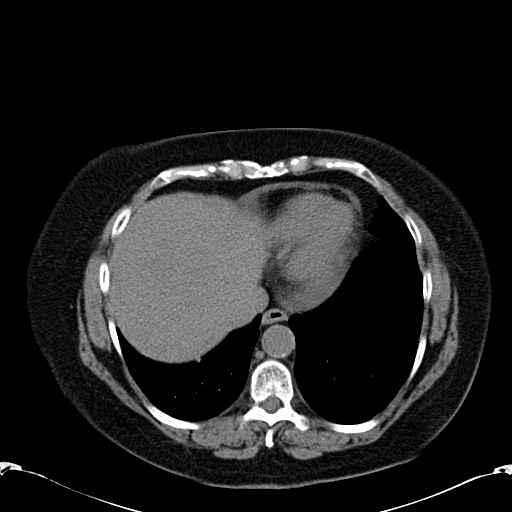
[im 21/59  lung]
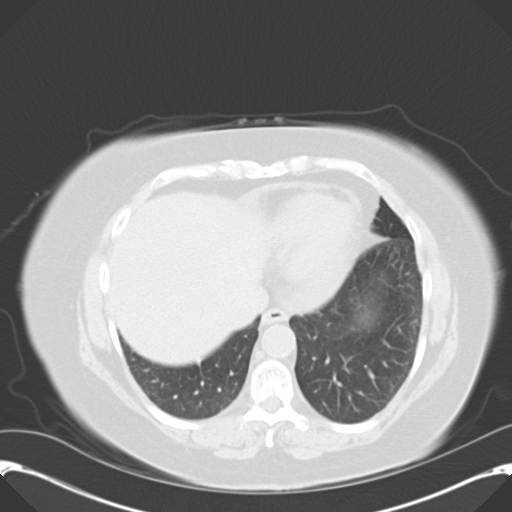
[im 25/59  lung]
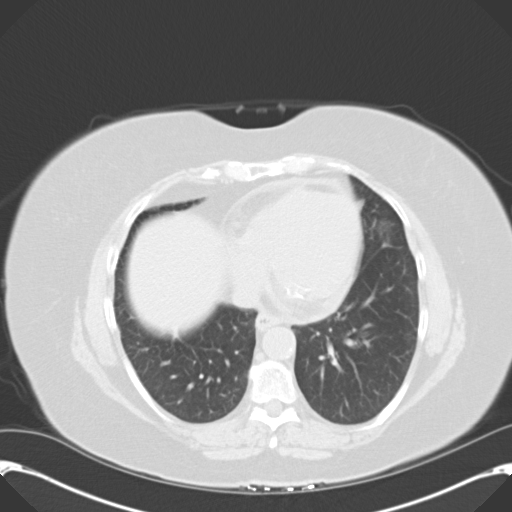
[im 34/59  lung]
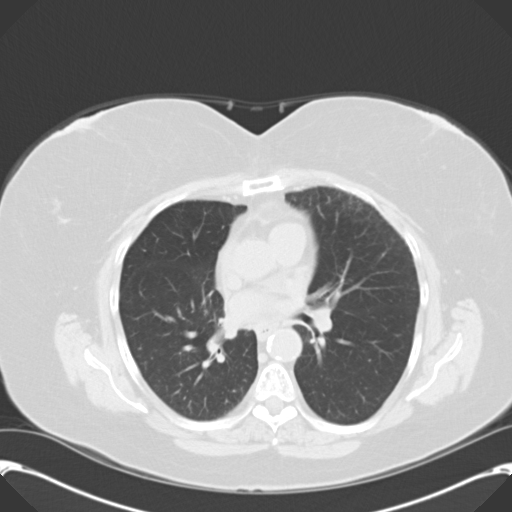
[im 38/59  lung]
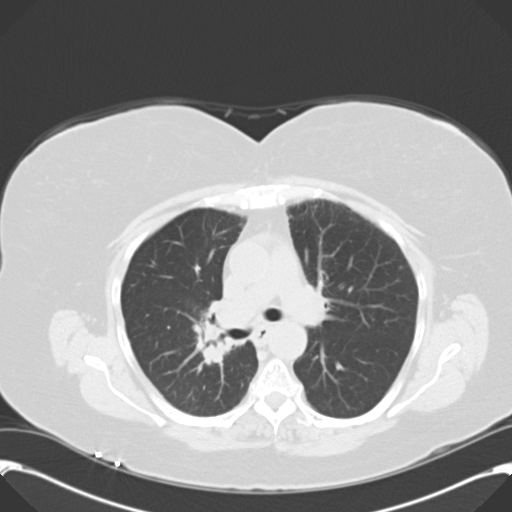
[im 42/59  mediastinal]
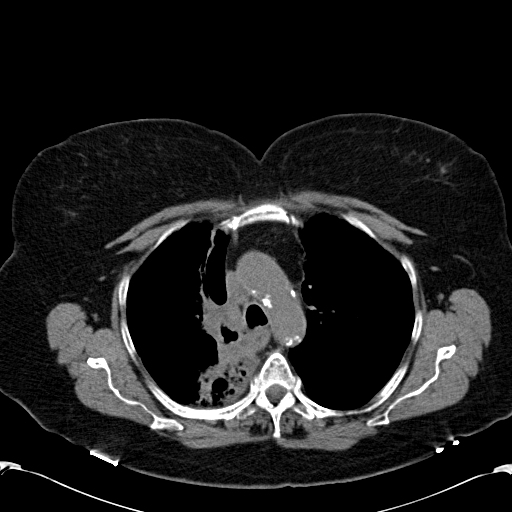
[im 42/59  lung]
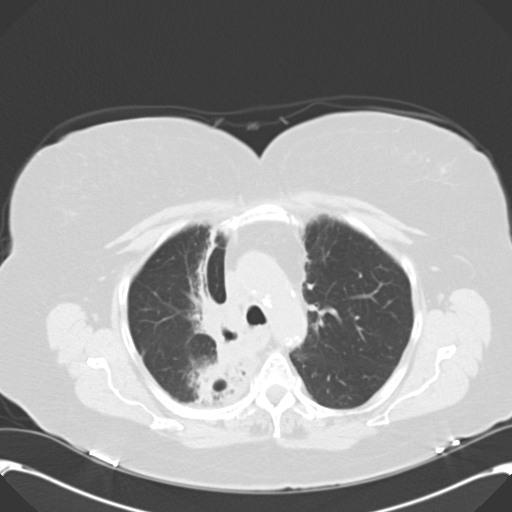
[im 46/59  lung]
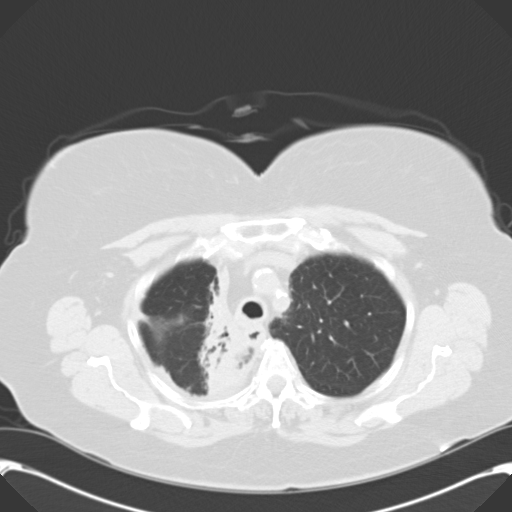
[im 50/59  lung]
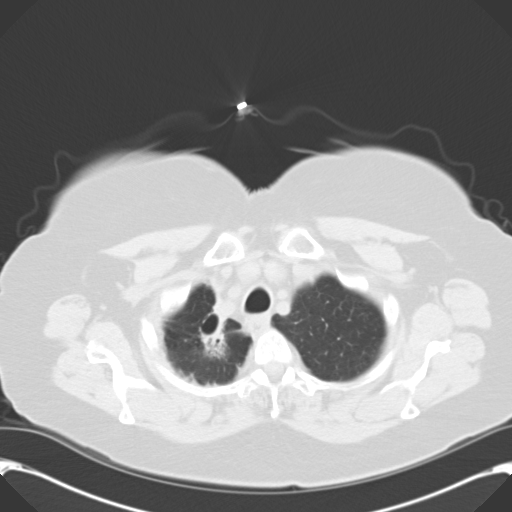
[im 54/59  lung]
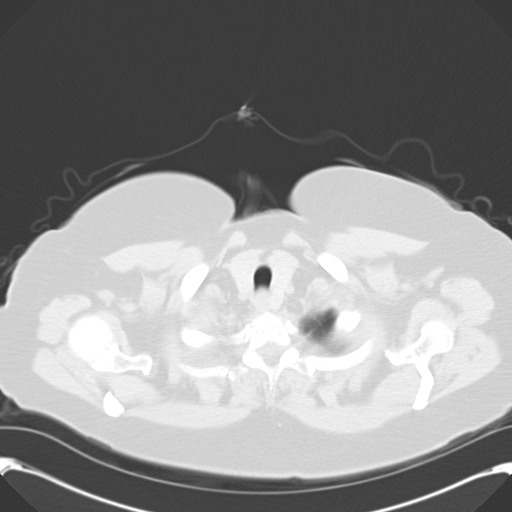

[Series 602: <mpr thick range> · coronal · 0.74mm/px · 3 of 89 slices shown]
[im 18/89  lung]
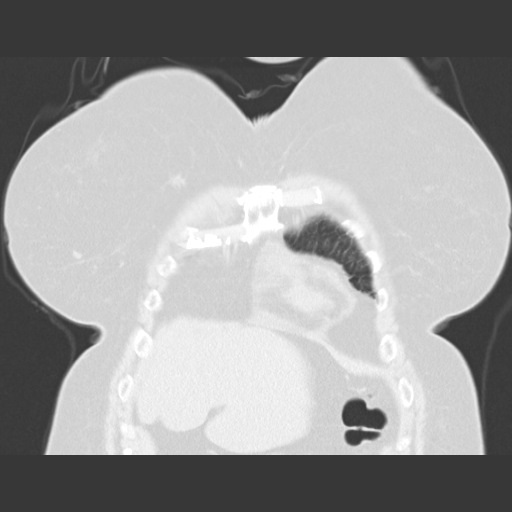
[im 36/89  lung]
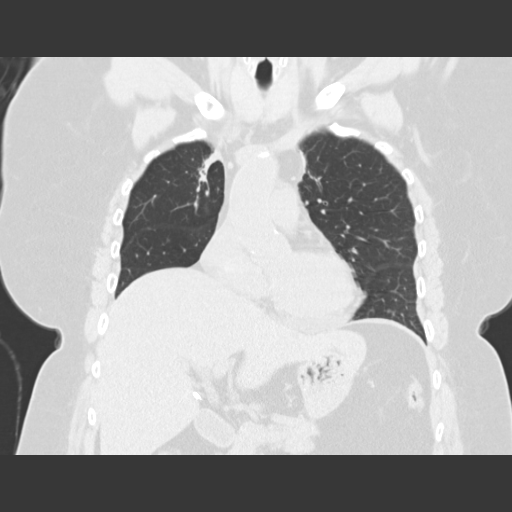
[im 53/89  lung]
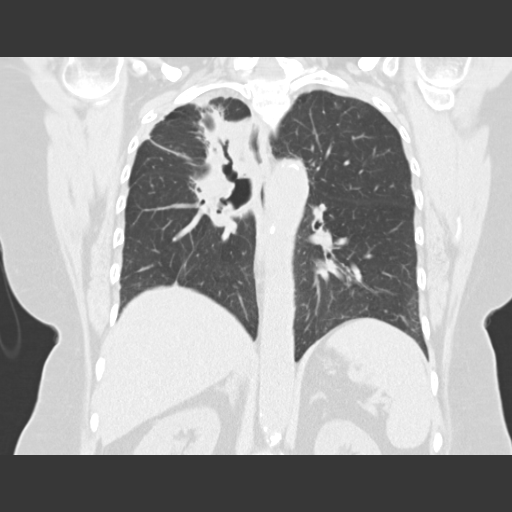

[15 of 36 positions shown; findings below may reference images not displayed]

FINDINGS: Mediastinum: The heart size appears normal. No pericardial effusion.
There is calcified atherosclerotic disease involving the thoracic
aorta as well as the LAD and RCA coronary arteries. No mediastinal
or hilar adenopathy identified. The trachea is patent and midline.
Mild circumferential wall thickening involving the esophagus is
noted.

Lungs/Pleura: There is no pleural effusion. Radiation change within
the medial aspect of the right upper lung is again identified. Again
noted is a 4 mm nodule in the right lower lobe, image 29/series 5.
No new or enlarging pulmonary nodule or mass noted.

Upper Abdomen: Incidental imaging through the upper abdomen is
unremarkable. The visualized portions of the liver and spleen are
normal.

Musculoskeletal: Review of the visualized bony structures is
unremarkable. No aggressive lytic or sclerotic bone lesions
identified.
IMPRESSION: 1. Stable CT of the chest.
2. No specific features identified to suggest residual or recurrence
of tumor.
3. Stable radiation change in the right lung.

## 2015-12-09 DIAGNOSIS — Z961 Presence of intraocular lens: Secondary | ICD-10-CM | POA: Diagnosis not present

## 2015-12-09 DIAGNOSIS — H401134 Primary open-angle glaucoma, bilateral, indeterminate stage: Secondary | ICD-10-CM | POA: Diagnosis not present

## 2015-12-15 ENCOUNTER — Other Ambulatory Visit: Payer: Self-pay | Admitting: Family Medicine

## 2015-12-16 NOTE — Telephone Encounter (Signed)
RF request for albuterol LOV: 11/04/15 Next ov: 03/03/16 Last written: 10/31/14 #81m w/ 3RF

## 2015-12-29 ENCOUNTER — Encounter (HOSPITAL_COMMUNITY): Payer: Self-pay | Admitting: Emergency Medicine

## 2015-12-29 DIAGNOSIS — Z794 Long term (current) use of insulin: Secondary | ICD-10-CM | POA: Diagnosis not present

## 2015-12-29 DIAGNOSIS — J449 Chronic obstructive pulmonary disease, unspecified: Secondary | ICD-10-CM | POA: Insufficient documentation

## 2015-12-29 DIAGNOSIS — Z7982 Long term (current) use of aspirin: Secondary | ICD-10-CM | POA: Insufficient documentation

## 2015-12-29 DIAGNOSIS — K219 Gastro-esophageal reflux disease without esophagitis: Secondary | ICD-10-CM | POA: Diagnosis not present

## 2015-12-29 DIAGNOSIS — R197 Diarrhea, unspecified: Secondary | ICD-10-CM | POA: Diagnosis not present

## 2015-12-29 DIAGNOSIS — E785 Hyperlipidemia, unspecified: Secondary | ICD-10-CM | POA: Diagnosis not present

## 2015-12-29 DIAGNOSIS — R1031 Right lower quadrant pain: Secondary | ICD-10-CM | POA: Diagnosis not present

## 2015-12-29 DIAGNOSIS — Z85118 Personal history of other malignant neoplasm of bronchus and lung: Secondary | ICD-10-CM | POA: Insufficient documentation

## 2015-12-29 DIAGNOSIS — M1990 Unspecified osteoarthritis, unspecified site: Secondary | ICD-10-CM | POA: Diagnosis not present

## 2015-12-29 DIAGNOSIS — K760 Fatty (change of) liver, not elsewhere classified: Secondary | ICD-10-CM | POA: Diagnosis not present

## 2015-12-29 DIAGNOSIS — I2510 Atherosclerotic heart disease of native coronary artery without angina pectoris: Secondary | ICD-10-CM | POA: Diagnosis not present

## 2015-12-29 DIAGNOSIS — Z87891 Personal history of nicotine dependence: Secondary | ICD-10-CM | POA: Diagnosis not present

## 2015-12-29 DIAGNOSIS — Z79899 Other long term (current) drug therapy: Secondary | ICD-10-CM | POA: Diagnosis not present

## 2015-12-29 DIAGNOSIS — E119 Type 2 diabetes mellitus without complications: Secondary | ICD-10-CM | POA: Insufficient documentation

## 2015-12-29 DIAGNOSIS — Z7984 Long term (current) use of oral hypoglycemic drugs: Secondary | ICD-10-CM | POA: Insufficient documentation

## 2015-12-29 DIAGNOSIS — Z7951 Long term (current) use of inhaled steroids: Secondary | ICD-10-CM | POA: Diagnosis not present

## 2015-12-29 DIAGNOSIS — Z9842 Cataract extraction status, left eye: Secondary | ICD-10-CM | POA: Diagnosis not present

## 2015-12-29 DIAGNOSIS — Z8669 Personal history of other diseases of the nervous system and sense organs: Secondary | ICD-10-CM | POA: Diagnosis not present

## 2015-12-29 DIAGNOSIS — Z9841 Cataract extraction status, right eye: Secondary | ICD-10-CM | POA: Diagnosis not present

## 2015-12-29 LAB — COMPREHENSIVE METABOLIC PANEL
ALK PHOS: 92 U/L (ref 38–126)
ALT: 31 U/L (ref 14–54)
AST: 24 U/L (ref 15–41)
Albumin: 3.6 g/dL (ref 3.5–5.0)
Anion gap: 12 (ref 5–15)
BILIRUBIN TOTAL: 0.3 mg/dL (ref 0.3–1.2)
BUN: 9 mg/dL (ref 6–20)
CALCIUM: 9 mg/dL (ref 8.9–10.3)
CO2: 23 mmol/L (ref 22–32)
CREATININE: 0.8 mg/dL (ref 0.44–1.00)
Chloride: 102 mmol/L (ref 101–111)
Glucose, Bld: 285 mg/dL — ABNORMAL HIGH (ref 65–99)
Potassium: 3.8 mmol/L (ref 3.5–5.1)
Sodium: 137 mmol/L (ref 135–145)
TOTAL PROTEIN: 6.8 g/dL (ref 6.5–8.1)

## 2015-12-29 LAB — CBC
HCT: 39.2 % (ref 36.0–46.0)
Hemoglobin: 12.8 g/dL (ref 12.0–15.0)
MCH: 29.3 pg (ref 26.0–34.0)
MCHC: 32.7 g/dL (ref 30.0–36.0)
MCV: 89.7 fL (ref 78.0–100.0)
PLATELETS: 173 10*3/uL (ref 150–400)
RBC: 4.37 MIL/uL (ref 3.87–5.11)
RDW: 14.6 % (ref 11.5–15.5)
WBC: 6.6 10*3/uL (ref 4.0–10.5)

## 2015-12-29 NOTE — ED Notes (Signed)
Pt. reports RLQ pain with diarrhea onset this evening , denies nausea or emesis , no fever or dysuria .

## 2015-12-30 ENCOUNTER — Emergency Department (HOSPITAL_COMMUNITY): Payer: Medicare Other

## 2015-12-30 ENCOUNTER — Emergency Department (HOSPITAL_COMMUNITY)
Admission: EM | Admit: 2015-12-30 | Discharge: 2015-12-30 | Disposition: A | Discharge status: Home / Self Care | Payer: Medicare Other | Attending: Emergency Medicine | Admitting: Emergency Medicine

## 2015-12-30 DIAGNOSIS — K760 Fatty (change of) liver, not elsewhere classified: Secondary | ICD-10-CM | POA: Diagnosis not present

## 2015-12-30 DIAGNOSIS — R1031 Right lower quadrant pain: Secondary | ICD-10-CM

## 2015-12-30 DIAGNOSIS — R197 Diarrhea, unspecified: Secondary | ICD-10-CM

## 2015-12-30 LAB — URINE MICROSCOPIC-ADD ON
RBC / HPF: NONE SEEN RBC/hpf (ref 0–5)
WBC, UA: NONE SEEN WBC/hpf (ref 0–5)

## 2015-12-30 LAB — URINALYSIS, ROUTINE W REFLEX MICROSCOPIC
BILIRUBIN URINE: NEGATIVE
Glucose, UA: >1000 mg/dL — AB
HGB URINE DIPSTICK: NEGATIVE
Ketones, ur: NEGATIVE mg/dL
Leukocytes, UA: NEGATIVE
NITRITE: NEGATIVE
PROTEIN: NEGATIVE mg/dL
SPECIFIC GRAVITY, URINE: 1.025 (ref 1.005–1.030)
pH: 5.5 (ref 5.0–8.0)

## 2015-12-30 LAB — CBG MONITORING, ED: Glucose-Capillary: 142 mg/dL — ABNORMAL HIGH (ref 65–99)

## 2015-12-30 MED ORDER — SODIUM CHLORIDE 0.9 % IV BOLUS (SEPSIS)
1000.0000 mL | Freq: Once | INTRAVENOUS | Status: AC
  Administered 2015-12-30: 1000 mL via INTRAVENOUS

## 2015-12-30 MED ORDER — IOHEXOL 300 MG/ML  SOLN
75.0000 mL | Freq: Once | INTRAMUSCULAR | Status: AC | PRN
  Administered 2015-12-30: 75 mL via INTRAVENOUS

## 2015-12-30 NOTE — ED Provider Notes (Signed)
CSN: [id number]     Arrival date & time 12/29/15  2246 History   First MD Initiated Contact with Patient 12/30/15 [phone]     Chief Complaint  Patient presents with  . Abdominal Pain    WEDA BAUMGARNER is a 70 y.o. female who presents the emergency department complaining of right lower quadrant abdominal pain since yesterday. Patient reports associated diarrhea. No episodes of diarrhea since arrival to the emergency department. She currently reports her abdominal pain is certain to feel better. She complains of 1/10 right lower quadrant abdominal pain unless the area is palpated which makes her pain worse. No hematochezia. No nausea or vomiting. She reports an intermittent lightheadedness over the past week, but none currently. She reports a history of diverticulitis. Patient's previous abdominal surgical history includes a cholecystectomy and hysterectomy. She denies fevers, vomiting, nausea, urinary symptoms, hematuria, hematochezia, rashes, or syncope.    Patient is a 70 y.o. female presenting with abdominal pain. The history is provided by the patient and a relative. No language interpreter was used.  Abdominal Pain Associated symptoms: diarrhea   Associated symptoms: no chest pain, no chills, no cough, no dysuria, no fever, no hematuria, no nausea, no shortness of breath, no sore throat and no vomiting     Past Medical History  Diagnosis Date  . Diabetes mellitus with complication (HCC)     diab retinopathy OU (laser)  . COPD (chronic obstructive pulmonary disease) ([place])   . History of diverticulitis of colon   . CAD (coronary artery disease)     NON obstructive. Cath 2006 preserved LV fxn, scattered irregularities without critical stenosis. 2008 stress echo negative for ischemia, but with hypertensive response  . Hyperlipidemia   . Mild cognitive impairment with memory loss     Likely from brain radiation therapy  . Bilateral diabetic retinopathy (HCC) 2015    Dr. Zigmund Daniel  . 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 Shirley Muscat, OD  . Lung cancer ([place])     non-small cell lung ca, stage III in 05/2011; systemic chemotherapy concurrent with radiation followed by prophylactic cranial irradiation and has been observation since July of 2010 with no evidence for disease recurrence-released from onc f/u 12/2014 (needs annual cxr by PCP).  CXR 08/2015 stable.  . Allergy     seasonal  . Arthritis     hands  . Blood transfusion without reported diagnosis     when taking chemo  . GERD (gastroesophageal reflux disease)     protonix  . Age-related nuclear cataract of both eyes 2016    +cortical age related cataracts OU    Past Surgical History  Procedure Laterality Date  . Appendectomy    . Abdominal hysterectomy  1997  . Carpal tunnel release    . Left heart catheterization with coronary angiogram N/A 05/30/2012    Procedure: LEFT HEART CATHETERIZATION WITH CORONARY ANGIOGRAM;  Surgeon: Burnell Blanks, MD;  Location: [facility] CATH LAB;  Service: Cardiovascular;  Laterality: N/A;  . Colonoscopy  10/24/00    normal.  [employer] hemoccult testing via [facility] 06/18/15 was NEG  . Cholecystectomy  2002  . Cataract extraction     Family History  Problem Relation Age of Onset  . Heart failure Mother   . Kidney disease Mother     renal failure  . Diabetes Mother   . Diabetes Father   . Diabetes Brother   . Heart disease Brother   . Heart attack Brother   .  Heart attack Brother   . Heart attack Brother   . Colon cancer Neg Hx    Social History  Substance Use Topics  . Smoking status: Former Smoker -- 1.00 packs/day for 45 years    Types: Cigarettes    Quit date: 10/24/2008  . Smokeless tobacco: Never Used  . Alcohol Use: No   OB History    No data available     Review of Systems  Constitutional: Negative for fever and chills.  HENT: Negative for congestion and sore throat.   Eyes: Negative for visual disturbance.  Respiratory: Negative for cough, shortness of breath  and wheezing.   Cardiovascular: Negative for chest pain and palpitations.  Gastrointestinal: Positive for abdominal pain and diarrhea. Negative for nausea, vomiting and blood in stool.  Genitourinary: Negative for dysuria, urgency, hematuria, decreased urine volume and difficulty urinating.  Musculoskeletal: Negative for back pain and neck pain.  Skin: Negative for rash.  Neurological: Negative for syncope and headaches.      Allergies  Lactose intolerance (gi) and Ibuprofen  Home Medications   Prior to Admission medications   Medication Sig Start Date End Date Taking? Authorizing Provider  albuterol (PROVENTIL) (2.5 MG/3ML) 0.083% nebulizer solution USE ONE VIAL IN NEBULIZER EVERY 4 HOURS AS NEEDED FOR WHEEZING OR SHORTNESS OF BREATH 12/16/15  Yes [name], MD  aspirin 81 MG tablet Take 81 mg by mouth at bedtime.    Yes Historical Provider, MD  aspirin-acetaminophen-caffeine (EXCEDRIN MIGRAINE) [phone] MG per tablet Take 1 tablet by mouth every 6 (six) hours as needed for headache.   Yes Historical Provider, MD  atorvastatin (LIPITOR) 80 MG tablet Take 1 tablet (80 mg total) by mouth daily. 07/20/15  Yes [name], MD  fluticasone (FLONASE) 50 MCG/ACT nasal spray Place 2 sprays into both nostrils at bedtime. Patient taking differently: Place 2 sprays into both nostrils at bedtime as needed for allergies.  06/25/14  Yes [name], MD  fluticasone (FLOVENT HFA) 220 MCG/ACT inhaler Inhale 2 puffs into the lungs 2 (two) times daily. 08/26/14  Yes [name], MD  glucose blood (ONE TOUCH ULTRA TEST) test strip TEST 3 TIMES A DAY 09/14/15  Yes [name], MD  Insulin Isophane & Regular Human (HUMULIN 70/30 KWIKPEN) (70-30) 100 UNIT/ML PEN 38 U SQ qAM and 34 U SQ qPM 07/03/15  Yes [name], MD  loratadine (CLARITIN) 10 MG tablet Take 10 mg by mouth at bedtime.    Yes Historical Provider, MD  metFORMIN (GLUCOPHAGE) 500 MG tablet TAKE ONE TABLET BY MOUTH  TWICE DAILY WITH A MEAL 10/14/15  Yes [name], MD  nitroGLYCERIN (NITROSTAT) 0.4 MG SL tablet Place 1 tablet (0.4 mg total) under the tongue every 5 (five) minutes as needed. For chest pain 12/31/12  Yes Lisabeth Pick, MD  [facility] DELICA LANCETS 16S MISC TEST 3 TIMES A DAY 09/14/15  Yes [name], MD  pantoprazole (PROTONIX) 40 MG tablet TAKE ONE TABLET BY MOUTH ONCE DAILY 10/15/15  Yes [name], MD  ALPHAGAN P 0.1 % SOLN Place 1 drop into both eyes every 8 (eight) hours as needed (dry eyes).  12/31/14   Historical Provider, MD  dorzolamide (TRUSOPT) 2 % ophthalmic solution Place 1 drop into both eyes 2 (two) times daily.  12/29/14   Historical Provider, MD  DUREZOL 0.05 % EMUL Place 1 drop into both eyes 2 (two) times daily.  12/23/14   Historical Provider, MD  BP 115/50 mmHg  Pulse 84  Temp(Src) 97.7 F (36.5 C) (Oral)  Resp 20  SpO2 96% Physical Exam  Constitutional: She appears well-developed and well-nourished. No distress.  Nontoxic appearing.  HENT:  Head: Normocephalic and atraumatic.  Mouth/Throat: Oropharynx is clear and moist.  Eyes: Conjunctivae are normal. Pupils are equal, round, and reactive to light. Right eye exhibits no discharge. Left eye exhibits no discharge.  Neck: Neck supple.  Cardiovascular: Normal rate, regular rhythm, normal heart sounds and intact distal pulses.  Exam reveals no gallop and no friction rub.   No murmur heard. Pulmonary/Chest: Effort normal and breath sounds normal. No respiratory distress. She has no wheezes. She has no rales.  Abdominal: Soft. Bowel sounds are normal. She exhibits no distension and no mass. There is tenderness. There is guarding. There is no rebound.  Abdomen is soft. Bowel sounds are present. Patient has moderate right lower quadrant abdominal tenderness to palpation with guarding. No psoas or obturator sign.   Musculoskeletal: She exhibits no edema or tenderness.  Lymphadenopathy:    She has no cervical  adenopathy.  Neurological: She is alert. Coordination normal.  Skin: Skin is warm and dry. No rash noted. She is not diaphoretic. No erythema. No pallor.  Psychiatric: She has a normal mood and affect. Her behavior is normal.  Nursing note and vitals reviewed.   ED Course  Procedures (including critical care time) Labs Review Labs Reviewed  COMPREHENSIVE METABOLIC PANEL - Abnormal; Notable for the following:    Glucose, Bld 285 (*)    All other components within normal limits  URINALYSIS, ROUTINE W REFLEX MICROSCOPIC (NOT AT [facility]) - Abnormal; Notable for the following:    APPearance CLOUDY (*)    Glucose, UA >1000 (*)    All other components within normal limits  URINE MICROSCOPIC-ADD ON - Abnormal; Notable for the following:    Squamous Epithelial / LPF 0-5 (*)    Bacteria, UA RARE (*)    All other components within normal limits  CBG MONITORING, ED - Abnormal; Notable for the following:    Glucose-Capillary 142 (*)    All other components within normal limits  CBC    Imaging Review Ct Abdomen Pelvis W Contrast  12/30/2015  CLINICAL DATA:  Right lower quadrant pain, diarrhea since yesterday EXAM: CT ABDOMEN AND PELVIS WITH CONTRAST TECHNIQUE: Multidetector CT imaging of the abdomen and pelvis was performed using the standard protocol following bolus administration of intravenous contrast. CONTRAST:  67m OMNIPAQUE IOHEXOL 300 MG/ML  SOLN COMPARISON:  02/03/2013 FINDINGS: Lower chest: Scarring in the lingula. Otherwise, Lung bases are clear. No effusions. Heart is normal size. Hepatobiliary: Mild diffuse fatty infiltration of the liver without focal abnormality. Prior cholecystectomy. Pancreas: Normal Spleen: Normal Adrenals/Urinary Tract: Normal Stomach/Bowel: Appendix is normal. Moderate sigmoid diverticulosis. No active diverticulitis. Stomach and small bowel are decompressed and unremarkable. Vascular/Lymphatic: Aortic and iliac calcifications without aneurysm. No adenopathy.  Reproductive: No mass or visible abnormality.  Prior hysterectomy. Other: No free fluid or free air. Musculoskeletal: No acute bony abnormality. Degenerative changes in the lower lumbar spine. IMPRESSION: Normal appendix. Sigmoid diverticulosis. Fatty liver. No acute findings. Electronically Signed   By: KRolm BaptiseM.D.   On: 12/30/2015 08:19   I have personally reviewed and evaluated these images and lab results as part of my medical decision-making.   EKG Interpretation None     Filed Vitals:   12/30/15 0645 12/30/15 0700 12/30/15 0715 12/30/15 0730  BP: 114/75 122/54 128/72 115/50  Pulse:  87 85 83 84  Temp:      TempSrc:      Resp: '21 19 22 20  '$ SpO2: 96% 94% 95% 96%    MDM   Meds given in ED:  Medications  sodium chloride 0.9 % bolus 1,000 mL (1,000 mLs Intravenous New Bag/Given 12/30/15 0701)  iohexol (OMNIPAQUE) 300 MG/ML solution 75 mL (75 mLs Intravenous Contrast Given 12/30/15 0751)    New Prescriptions   No medications on file    Final diagnoses:  Right lower quadrant abdominal pain  Diarrhea, unspecified type   This  is a 70 y.o. female who presents the emergency department complaining of right lower quadrant abdominal pain since yesterday. Patient reports associated diarrhea. No episodes of diarrhea since arrival to the emergency department. She currently reports her abdominal pain is certain to feel better. She complains of 1/10 right lower quadrant abdominal pain unless the area is palpated which makes her pain worse. No hematochezia. No nausea or vomiting. On exam the patient is afebrile and nontoxic appearing. Her abdomen is soft and she has moderate right lower quadrant tenderness to palpation with some guarding. No psoas or obturator sign. CMP is remarkable only for blood sugar 285. CBC is within normal limits. No leukocytosis. Urinalysis is nitrite and leukocyte negative. Will provide with fluid bolus and check CT scan of abdomen and pelvis.  CT scan indicates  normal appendix. No acute findings in the abdomen or pelvis. At reevaluation patient reports her abdominal pain has resolved. She has had no diarrhea since she arrived to the emergency department over 9 hours ago. She is tolerating PO. Will discharge with close follow up by her PCP. I discussed return precautions. I advised the patient to follow-up with their primary care provider this week. I advised the patient to return to the emergency department with new or worsening symptoms or new concerns. The patient verbalized understanding and agreement with plan.       Waynetta Pean, PA-C 12/30/15 [id number]  Everlene Balls, MD 12/30/15 1725

## 2015-12-30 NOTE — Discharge Instructions (Signed)
Abdominal Pain, Adult °Many things can cause abdominal pain. Usually, abdominal pain is not caused by a disease and will improve without treatment. It can often be observed and treated at home. Your health care provider will do a physical exam and possibly order blood tests and X-rays to help determine the seriousness of your pain. However, in many cases, more time must pass before a clear cause of the pain can be found. Before that point, your health care provider may not know if you need more testing or further treatment. °HOME CARE INSTRUCTIONS °Monitor your abdominal pain for any changes. The following actions may help to alleviate any discomfort you are experiencing: °· Only take over-the-counter or prescription medicines as directed by your health care provider. °· Do not take laxatives unless directed to do so by your health care provider. °· Try a clear liquid diet (broth, tea, or water) as directed by your health care provider. Slowly move to a bland diet as tolerated. °SEEK MEDICAL CARE IF: °· You have unexplained abdominal pain. °· You have abdominal pain associated with nausea or diarrhea. °· You have pain when you urinate or have a bowel movement. °· You experience abdominal pain that wakes you in the night. °· You have abdominal pain that is worsened or improved by eating food. °· You have abdominal pain that is worsened with eating fatty foods. °· You have a fever. °SEEK IMMEDIATE MEDICAL CARE IF: °· Your pain does not go away within 2 hours. °· You keep throwing up (vomiting). °· Your pain is felt only in portions of the abdomen, such as the right side or the left lower portion of the abdomen. °· You pass bloody or black tarry stools. °MAKE SURE YOU: °· Understand these instructions. °· Will watch your condition. °· Will get help right away if you are not doing well or get worse. °  °This information is not intended to replace advice given to you by your health care provider. Make sure you discuss  any questions you have with your health care provider. °  °Document Released: 07/20/2005 Document Revised: 07/01/2015 Document Reviewed: 06/19/2013 °Elsevier Interactive Patient Education ©2016 [employer] ° °Diarrhea °Diarrhea is frequent loose and watery bowel movements. It can cause you to feel weak and dehydrated. Dehydration can cause you to become tired and thirsty, have a dry mouth, and have decreased urination that often is dark yellow. Diarrhea is a sign of another problem, most often an infection that will not last long. In most cases, diarrhea typically lasts 2-3 days. However, it can last longer if it is a sign of something more serious. It is important to treat your diarrhea as directed by your caregiver to lessen or prevent future episodes of diarrhea. °CAUSES  °Some common causes include: °· Gastrointestinal infections caused by viruses, bacteria, or parasites. °· Food poisoning or food allergies. °· Certain medicines, such as antibiotics, chemotherapy, and laxatives. °· Artificial sweeteners and fructose. °· Digestive disorders. °HOME CARE INSTRUCTIONS °· Ensure adequate fluid intake (hydration): Have 1 cup (8 oz) of fluid for each diarrhea episode. Avoid fluids that contain simple sugars or sports drinks, fruit juices, whole milk products, and sodas. Your urine should be clear or pale yellow if you are drinking enough fluids. Hydrate with an oral rehydration solution that you can purchase at pharmacies, retail stores, and online. You can prepare an oral rehydration solution at home by mixing the following ingredients together: °¨  - tsp table salt. °¨ ¾ tsp baking soda. °¨    tsp salt substitute containing potassium chloride. °¨ 1  tablespoons sugar. °¨ 1 L (34 oz) of water. °· Certain foods and beverages may increase the speed at which food moves through the gastrointestinal (GI) tract. These foods and beverages should be avoided and include: °¨ Caffeinated and alcoholic beverages. °¨ High-fiber  foods, such as raw fruits and vegetables, nuts, seeds, and whole grain breads and cereals. °¨ Foods and beverages sweetened with sugar alcohols, such as xylitol, sorbitol, and mannitol. °· Some foods may be well tolerated and may help thicken stool including: °¨ Starchy foods, such as rice, toast, pasta, low-sugar cereal, oatmeal, grits, baked potatoes, crackers, and bagels. °¨ Bananas. °¨ Applesauce. °· Add probiotic-rich foods to help increase healthy bacteria in the GI tract, such as yogurt and fermented milk products. °· Wash your hands well after each diarrhea episode. °· Only take over-the-counter or prescription medicines as directed by your caregiver. °· Take a warm bath to relieve any burning or pain from frequent diarrhea episodes. °SEEK IMMEDIATE MEDICAL CARE IF:  °· You are unable to keep fluids down. °· You have persistent vomiting. °· You have blood in your stool, or your stools are black and tarry. °· You do not urinate in 6-8 hours, or there is only a small amount of very dark urine. °· You have abdominal pain that increases or localizes. °· You have weakness, dizziness, confusion, or light-headedness. °· You have a severe headache. °· Your diarrhea gets worse or does not get better. °· You have a fever or persistent symptoms for more than 2-3 days. °· You have a fever and your symptoms suddenly get worse. °MAKE SURE YOU:  °· Understand these instructions. °· Will watch your condition. °· Will get help right away if you are not doing well or get worse. °  °This information is not intended to replace advice given to you by your health care provider. Make sure you discuss any questions you have with your health care provider. °  °Document Released: 09/30/2002 Document Revised: 10/31/2014 Document Reviewed: 06/17/2012 °Elsevier Interactive Patient Education ©2016 [employer] ° °

## 2016-01-03 IMAGING — CT CT HEAD W/O CM
2 series · 15 of 30 positions shown, 19 images · non-contrast
Comparison: 12/19/2008

CLINICAL DATA: Transient ischemic attack with lightheadedness and
dizziness.

EXAM:
CT HEAD WITHOUT CONTRAST
TECHNIQUE: Contiguous axial images were obtained from the base of the skull
through the vertex without intravenous contrast.

[Series 201: head w/o, idose (1) · axial · non-contrast · 0.39mm/px · z∈[+67,+197]mm · 13 of 32 slices shown, 17 images]
[im 3/32  brain]
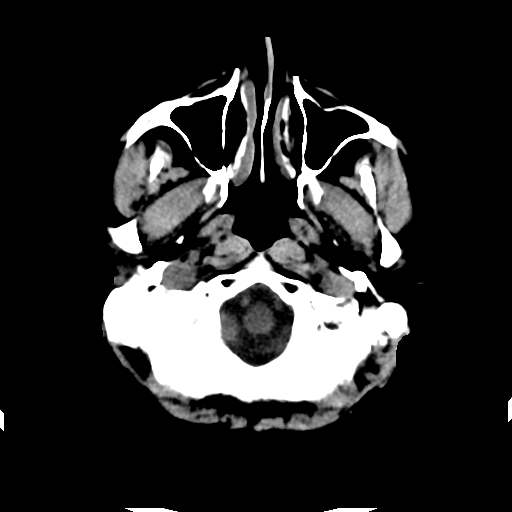
[im 3/32  bone]
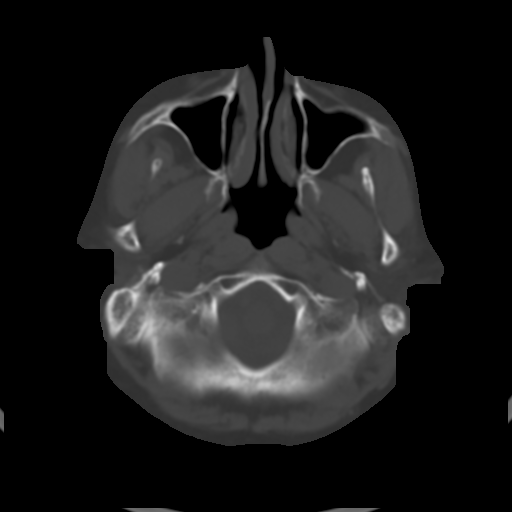
[im 5/32  brain]
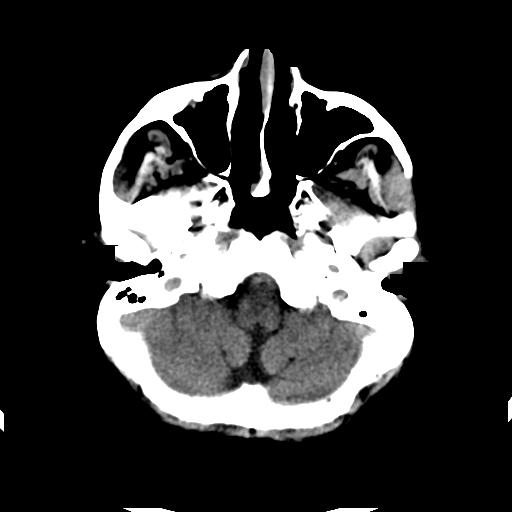
[im 7/32  brain]
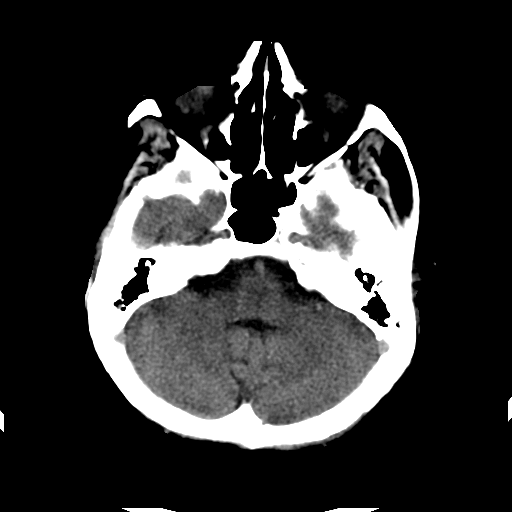
[im 9/32  brain]
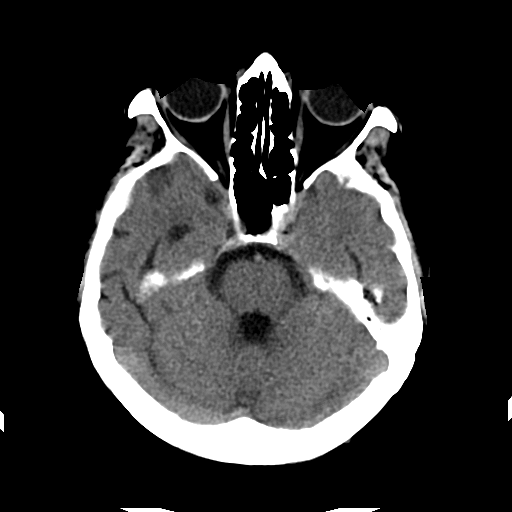
[im 12/32  brain]
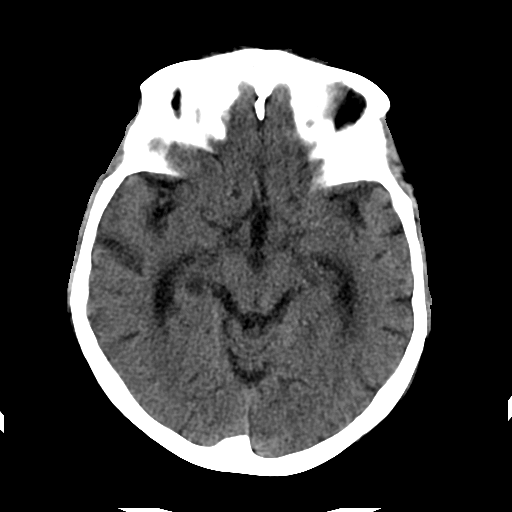
[im 12/32  bone]
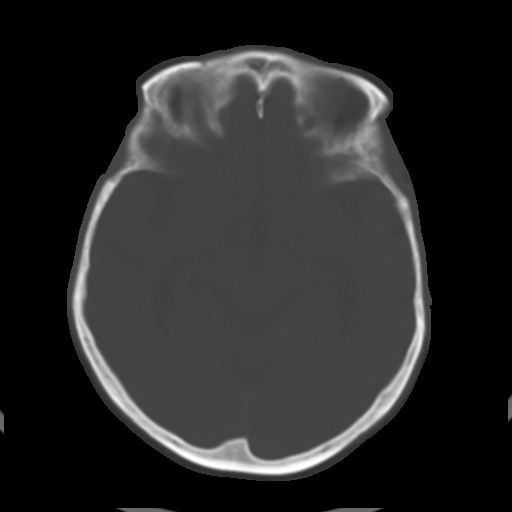
[im 14/32  brain]
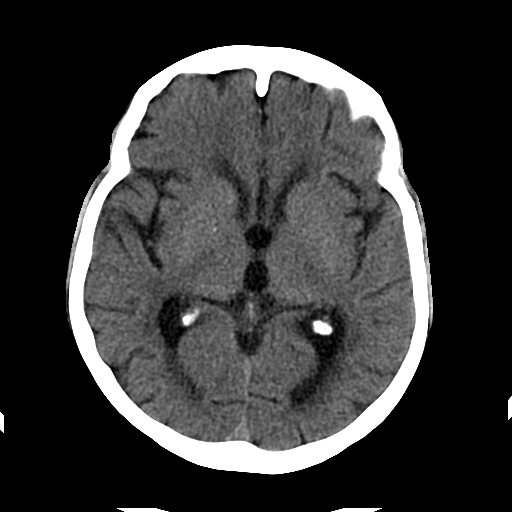
[im 16/32  brain]
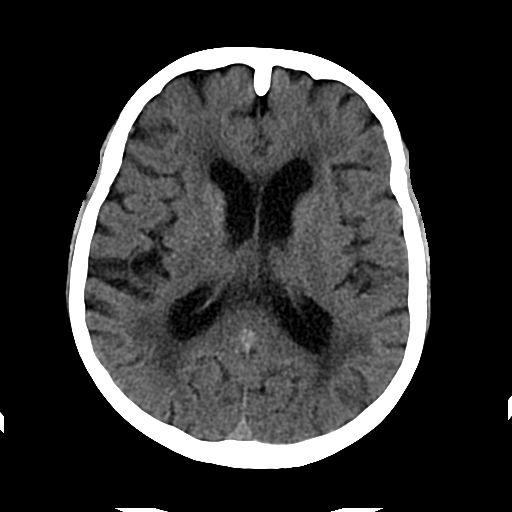
[im 18/32  brain]
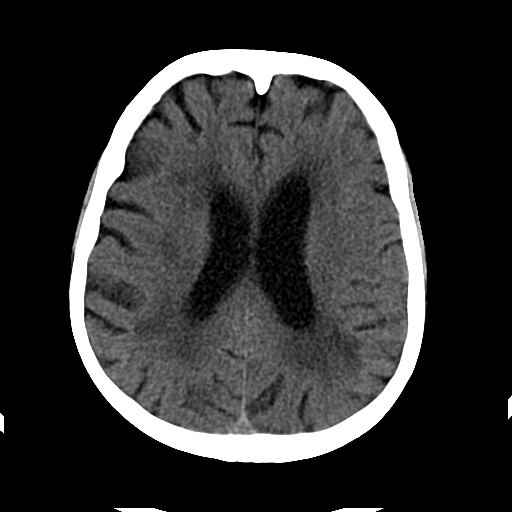
[im 20/32  brain]
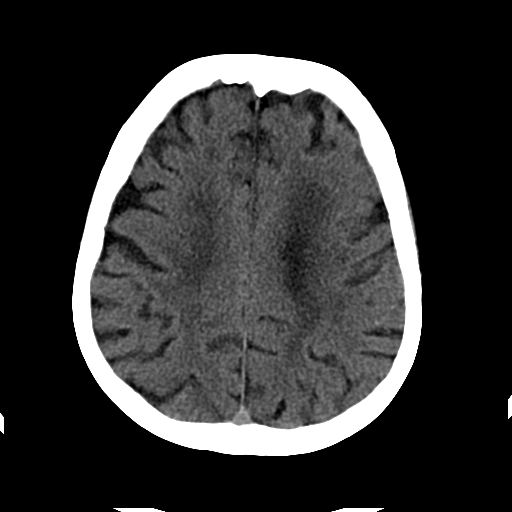
[im 20/32  bone]
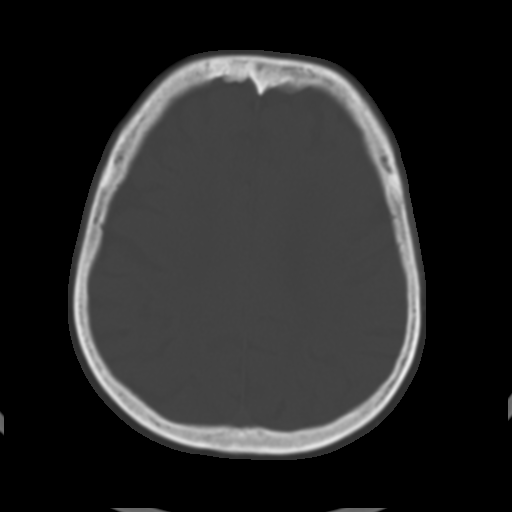
[im 23/32  brain]
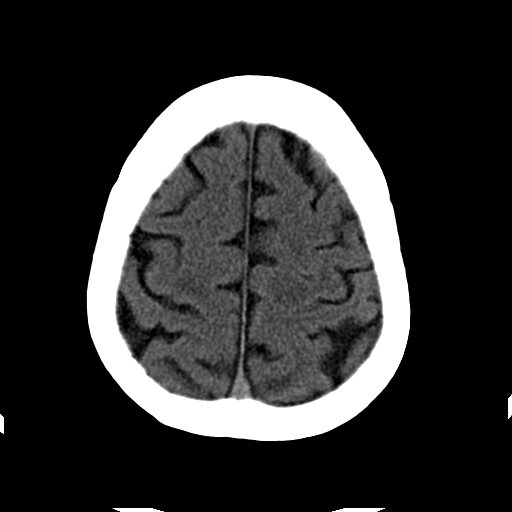
[im 25/32  brain]
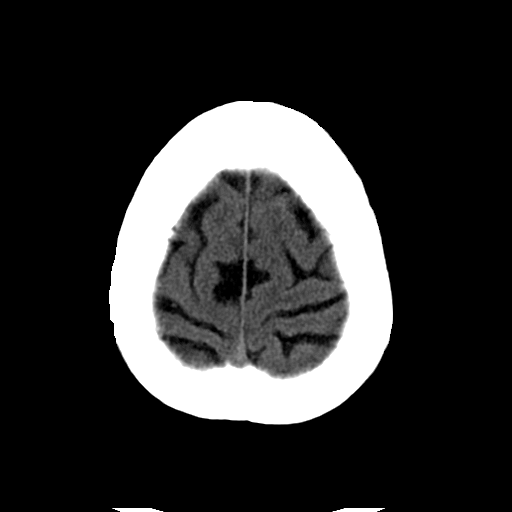
[im 27/32  brain]
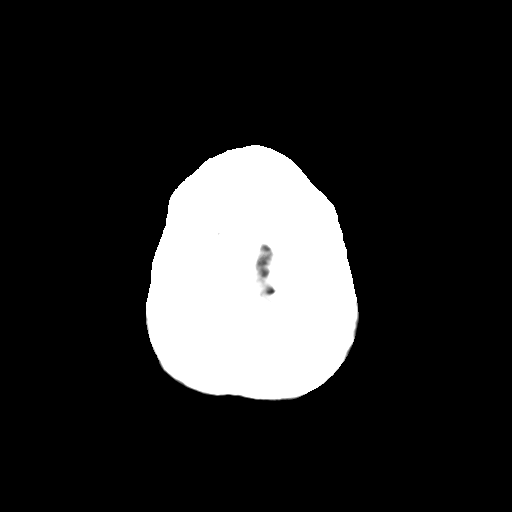
[im 29/32  brain]
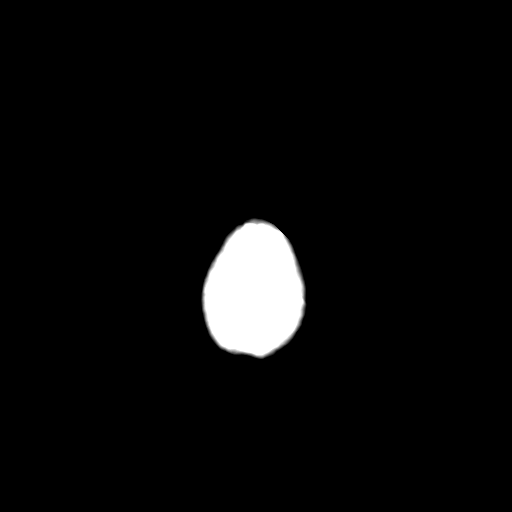
[im 29/32  bone]
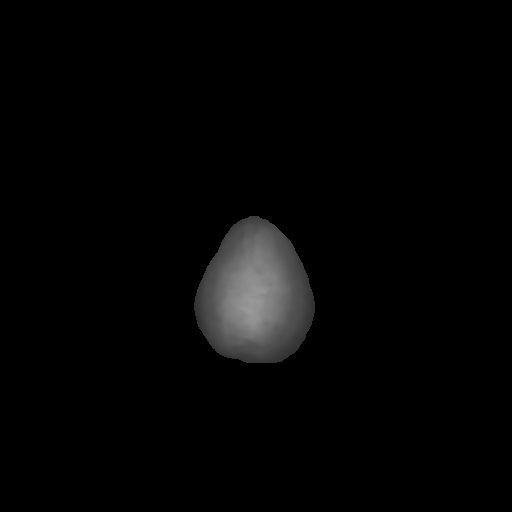

[Series 202: head w/o bone, idose (1) · axial · non-contrast · 0.39mm/px · z∈[+67,+87]mm · 2 of 32 slices shown]
[im 3/32  bone]
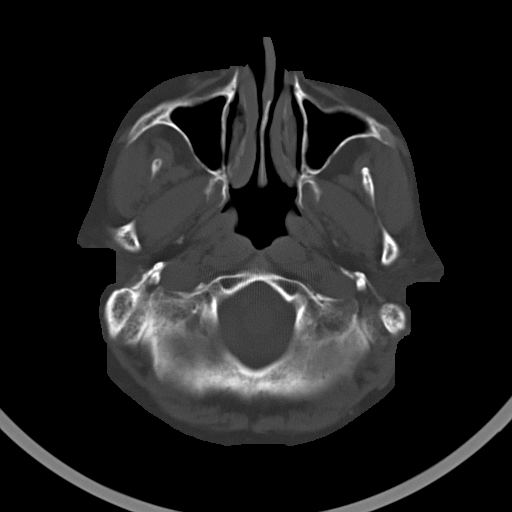
[im 7/32  bone]
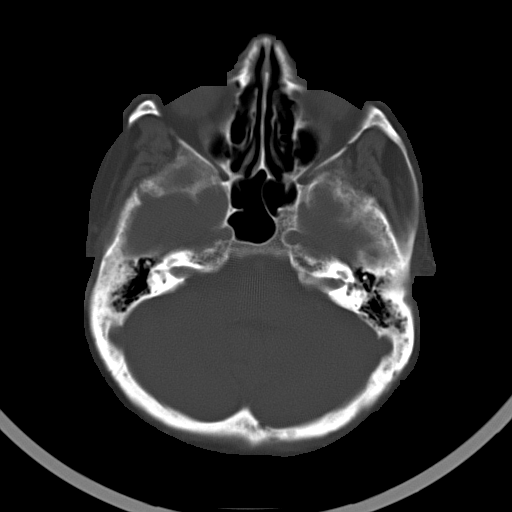

[15 of 30 positions shown; findings below may reference images not displayed]

FINDINGS: Skull and Sinuses:Negative for fracture or destructive process. The
mastoids, middle ears, and imaged paranasal sinuses are clear.

Orbits: Bilateral cataract resection.  No acute findings.

Brain: No evidence of acute infarction, hemorrhage, hydrocephalus,
or mass lesion/mass effect. There is generalized cerebral volume
loss which has progressed from 4737. Bilateral cerebral white matter
low-attenuation consistent with moderate chronic small vessel
disease. White matter disease has progressed from 4737 head CT but
appears stable from brain MRI 01/10/2014 .
IMPRESSION: 1. No acute intracranial findings.
2. Atrophy and small vessel disease, stable from 3778 brain MRI.

## 2016-01-06 DIAGNOSIS — H11153 Pinguecula, bilateral: Secondary | ICD-10-CM | POA: Diagnosis not present

## 2016-01-06 DIAGNOSIS — H35353 Cystoid macular degeneration, bilateral: Secondary | ICD-10-CM | POA: Diagnosis not present

## 2016-01-11 ENCOUNTER — Other Ambulatory Visit: Payer: Self-pay | Admitting: Family Medicine

## 2016-01-12 NOTE — Telephone Encounter (Signed)
RF request for flovent LOV: 11/04/15 Next ov: 03/03/16 Last written: 08/26/14 #1 w/ 12RF

## 2016-01-18 ENCOUNTER — Ambulatory Visit (INDEPENDENT_AMBULATORY_CARE_PROVIDER_SITE_OTHER): Payer: Medicare Other | Admitting: Ophthalmology

## 2016-01-18 DIAGNOSIS — I10 Essential (primary) hypertension: Secondary | ICD-10-CM

## 2016-01-18 DIAGNOSIS — H43813 Vitreous degeneration, bilateral: Secondary | ICD-10-CM

## 2016-01-18 DIAGNOSIS — E113591 Type 2 diabetes mellitus with proliferative diabetic retinopathy without macular edema, right eye: Secondary | ICD-10-CM | POA: Diagnosis not present

## 2016-01-18 DIAGNOSIS — E113512 Type 2 diabetes mellitus with proliferative diabetic retinopathy with macular edema, left eye: Secondary | ICD-10-CM | POA: Diagnosis not present

## 2016-01-18 DIAGNOSIS — H59032 Cystoid macular edema following cataract surgery, left eye: Secondary | ICD-10-CM | POA: Diagnosis not present

## 2016-01-18 DIAGNOSIS — E11311 Type 2 diabetes mellitus with unspecified diabetic retinopathy with macular edema: Secondary | ICD-10-CM | POA: Diagnosis not present

## 2016-01-18 DIAGNOSIS — H35033 Hypertensive retinopathy, bilateral: Secondary | ICD-10-CM | POA: Diagnosis not present

## 2016-01-21 ENCOUNTER — Other Ambulatory Visit: Payer: Self-pay | Admitting: Family Medicine

## 2016-01-21 NOTE — Telephone Encounter (Signed)
RF request for metformin LOV: 11/04/15 Next ov: 03/03/16 Last written: 10/14/15 #180 w/ 0RF

## 2016-02-18 ENCOUNTER — Other Ambulatory Visit: Payer: Self-pay | Admitting: *Deleted

## 2016-02-18 MED ORDER — ONETOUCH DELICA LANCETS 33G MISC: Status: DC

## 2016-02-18 MED ORDER — GLUCOSE BLOOD VI STRP: ORAL_STRIP | Status: DC

## 2016-02-18 NOTE — Telephone Encounter (Signed)
Pts husband LMOM on 02/18/16 at 9:24am requesting refills for DM supplies (test strips and lancets).   LOV: 11/04/15 NOV: 03/03/16  Rx's sent to pharmacy. Pts husband advised and voiced understanding.

## 2016-02-25 ENCOUNTER — Other Ambulatory Visit: Payer: Self-pay

## 2016-02-25 DIAGNOSIS — Z1231 Encounter for screening mammogram for malignant neoplasm of breast: Secondary | ICD-10-CM

## 2016-02-29 ENCOUNTER — Encounter (INDEPENDENT_AMBULATORY_CARE_PROVIDER_SITE_OTHER): Payer: Medicare Other | Admitting: Ophthalmology

## 2016-02-29 DIAGNOSIS — E113513 Type 2 diabetes mellitus with proliferative diabetic retinopathy with macular edema, bilateral: Secondary | ICD-10-CM

## 2016-02-29 DIAGNOSIS — H59032 Cystoid macular edema following cataract surgery, left eye: Secondary | ICD-10-CM

## 2016-02-29 DIAGNOSIS — H35033 Hypertensive retinopathy, bilateral: Secondary | ICD-10-CM | POA: Diagnosis not present

## 2016-02-29 DIAGNOSIS — E11311 Type 2 diabetes mellitus with unspecified diabetic retinopathy with macular edema: Secondary | ICD-10-CM | POA: Diagnosis not present

## 2016-02-29 DIAGNOSIS — I10 Essential (primary) hypertension: Secondary | ICD-10-CM

## 2016-02-29 DIAGNOSIS — H43813 Vitreous degeneration, bilateral: Secondary | ICD-10-CM

## 2016-03-03 ENCOUNTER — Ambulatory Visit (INDEPENDENT_AMBULATORY_CARE_PROVIDER_SITE_OTHER): Payer: Medicare Other | Admitting: Family Medicine

## 2016-03-03 ENCOUNTER — Encounter: Payer: Self-pay | Admitting: Family Medicine

## 2016-03-03 VITALS — BP 106/64 | HR 104 | Temp 98.0°F | Resp 16 | Ht 62.0 in | Wt 172.5 lb

## 2016-03-03 DIAGNOSIS — E2839 Other primary ovarian failure: Secondary | ICD-10-CM

## 2016-03-03 DIAGNOSIS — E118 Type 2 diabetes mellitus with unspecified complications: Secondary | ICD-10-CM | POA: Diagnosis not present

## 2016-03-03 DIAGNOSIS — J438 Other emphysema: Secondary | ICD-10-CM

## 2016-03-03 DIAGNOSIS — Z1159 Encounter for screening for other viral diseases: Secondary | ICD-10-CM

## 2016-03-03 DIAGNOSIS — E785 Hyperlipidemia, unspecified: Secondary | ICD-10-CM | POA: Diagnosis not present

## 2016-03-03 LAB — BASIC METABOLIC PANEL
BUN: 8 mg/dL (ref 6–23)
CALCIUM: 9.2 mg/dL (ref 8.4–10.5)
CO2: 29 mEq/L (ref 19–32)
Chloride: 103 mEq/L (ref 96–112)
Creatinine, Ser: 0.78 mg/dL (ref 0.40–1.20)
GFR: 78.13 mL/min (ref >60.00)
GLUCOSE: 94 mg/dL (ref 70–99)
POTASSIUM: 4.3 meq/L (ref 3.5–5.1)
Sodium: 141 mEq/L (ref 135–145)

## 2016-03-03 LAB — HEMOGLOBIN A1C: Hgb A1c MFr Bld: 7.5 % — ABNORMAL HIGH (ref 4.6–6.5)

## 2016-03-03 NOTE — Progress Notes (Signed)
OFFICE VISIT  03/03/2016   CC:  Chief Complaint  Patient presents with  . Follow-up    Pt is not fasting.    HPI:    Patient is a 70 y.o. Caucasian female who presents for 4 mo f/u DM 2, HLD, COPD.  DM 2: afternoon highs 170s-200s, rare reading over 300. Mornings seem to be a bit better but she can't recall any numbers, no log brought. Takes insulin 70/30 as listed below, also metformin 500 mg bid. She makes pretty good effort to eat diabetic diet.  HLD: taking lipitor daily w/out side effect  COPD: says breathing feels "ok", has a coughing spell about once a day and a breathing treatment with albuterol helps.  No CP, no fever, no LE edema, no palpitations, no myalgias.  Past Medical History  Diagnosis Date  . Diabetes mellitus with complication (HCC)     diab retinopathy OU (laser)  . COPD (chronic obstructive pulmonary disease) ([place])   . History of diverticulitis of colon   . CAD (coronary artery disease)     NON obstructive. Cath 2006 preserved LV fxn, scattered irregularities without critical stenosis. 2008 stress echo negative for ischemia, but with hypertensive response  . Hyperlipidemia   . Mild cognitive impairment with memory loss     Likely from brain radiation therapy  . Bilateral diabetic retinopathy (HCC) 2015    Dr. Zigmund Daniel  . 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 Shirley Muscat, OD  . Lung cancer ([place])     non-small cell lung ca, stage III in 05/2011; systemic chemotherapy concurrent with radiation followed by prophylactic cranial irradiation and has been observation since July of 2010 with no evidence for disease recurrence-released from onc f/u 12/2014 (needs annual cxr by PCP).  CXR 08/2015 stable.  . Allergy     seasonal  . Arthritis     hands  . Blood transfusion without reported diagnosis     when taking chemo  . GERD (gastroesophageal reflux disease)     protonix  . Age-related nuclear cataract of both eyes 2016    +cortical age related cataracts  OU     Past Surgical History  Procedure Laterality Date  . Appendectomy    . Abdominal hysterectomy  1997  . Carpal tunnel release    . Left heart catheterization with coronary angiogram N/A 05/30/2012    Procedure: LEFT HEART CATHETERIZATION WITH CORONARY ANGIOGRAM;  Surgeon: Burnell Blanks, MD;  Location: [facility] CATH LAB;  Service: Cardiovascular;  Laterality: N/A;  . Colonoscopy  10/24/00    normal.  [facility] hemoccult testing via [facility] 06/18/15 was NEG  . Cholecystectomy  2002  . Cataract extraction      Outpatient Prescriptions Prior to Visit  Medication Sig Dispense Refill  . albuterol (PROVENTIL) (2.5 MG/3ML) 0.083% nebulizer solution USE ONE VIAL IN NEBULIZER EVERY 4 HOURS AS NEEDED FOR WHEEZING OR SHORTNESS OF BREATH 75 mL 3  . aspirin 81 MG tablet Take 81 mg by mouth at bedtime.     Marland Kitchen aspirin-acetaminophen-caffeine (EXCEDRIN MIGRAINE) 250-250-65 MG per tablet Take 1 tablet by mouth every 6 (six) hours as needed for headache.    Marland Kitchen atorvastatin (LIPITOR) 80 MG tablet Take 1 tablet (80 mg total) by mouth daily. 90 tablet 1  . FLOVENT HFA 220 MCG/ACT inhaler INHALE TWO PUFFS BY MOUTH TWICE DAILY 12 g 6  . fluticasone (FLONASE) 50 MCG/ACT nasal spray Place 2 sprays into both nostrils at bedtime. (Patient taking differently: Place 2 sprays  into both nostrils at bedtime as needed for allergies. ) 16 g 3  . glucose blood (ONE TOUCH ULTRA TEST) test strip TEST 3 TIMES A DAY 100 each 11  . Insulin Isophane & Regular Human (HUMULIN 70/30 KWIKPEN) (70-30) 100 UNIT/ML PEN 38 U SQ qAM and 34 U SQ qPM 15 mL 12  . loratadine (CLARITIN) 10 MG tablet Take 10 mg by mouth at bedtime.     . metFORMIN (GLUCOPHAGE) 500 MG tablet TAKE ONE TABLET BY MOUTH TWICE DAILY WITH MEALS 180 tablet 1  . nitroGLYCERIN (NITROSTAT) 0.4 MG SL tablet Place 1 tablet (0.4 mg total) under the tongue every 5 (five) minutes as needed. For chest pain 50 tablet 3  . ONETOUCH DELICA LANCETS 73X MISC TEST 3 TIMES A DAY 100  each 11  . pantoprazole (PROTONIX) 40 MG tablet TAKE ONE TABLET BY MOUTH ONCE DAILY 30 tablet 12  . ALPHAGAN P 0.1 % SOLN Place 1 drop into both eyes every 8 (eight) hours as needed (dry eyes). Reported on 03/03/2016    . dorzolamide (TRUSOPT) 2 % ophthalmic solution Place 1 drop into both eyes 2 (two) times daily. Reported on 03/03/2016    . DUREZOL 0.05 % EMUL Place 1 drop into both eyes 2 (two) times daily. Reported on 03/03/2016     No facility-administered medications prior to visit.    Allergies  Allergen Reactions  . Lactose Intolerance (Gi) Diarrhea  . Ibuprofen     REACTION: Anxiousness, hypoventilate     ROS As per HPI  PE: Blood pressure 106/64, pulse 104, temperature 98 F (36.7 C), temperature source Oral, resp. rate 16, height '5\' 2"'$  (1.575 m), weight 172 lb 8 oz (78.245 kg), SpO2 97 %. Gen: Alert, well appearing.  Patient is oriented to person, place, time, and situation. CV: RRR, no m/r/g.   LUNGS: CTA bilat, nonlabored resps, good aeration in all lung fields. EXT: no clubbing, cyanosis, or edema.    LABS:  Lab Results  Component Value Date   TSH 0.49 07/01/2011   Lab Results  Component Value Date   WBC 6.6 12/29/2015   HGB 12.8 12/29/2015   HCT 39.2 12/29/2015   MCV 89.7 12/29/2015   PLT 173 12/29/2015   Lab Results  Component Value Date   CREATININE 0.80 12/29/2015   BUN 9 12/29/2015   NA 137 12/29/2015   K 3.8 12/29/2015   CL 102 12/29/2015   CO2 23 12/29/2015   Lab Results  Component Value Date   ALT 31 12/29/2015   AST 24 12/29/2015   ALKPHOS 92 12/29/2015   BILITOT 0.3 12/29/2015   Lab Results  Component Value Date   CHOL 213* 07/17/2015   Lab Results  Component Value Date   HDL 32.50* 07/17/2015   Lab Results  Component Value Date   LDLCALC 156* 03/03/2015   Lab Results  Component Value Date   TRIG 212.0* 07/17/2015   Lab Results  Component Value Date   CHOLHDL 7 07/17/2015   Lab Results  Component Value Date   HGBA1C  7.3* 11/04/2015   IMPRESSION AND PLAN:  1) DM 2, home glucose measurements indicate questionable control in afternoon/evenings.  Will see what A1c shows today. Of note, may be able to increase her metformin to 1000 mg bid.  2) COPD: The current medical regimen is effective;  continue present plan and medications.  3) HLD: Tolerating statin.  Pt not fasting so can't check FLP today.  Last transaminases good 2 mo  ago.  4) Preventative health care: screening for Hep C done today. Also, ordered DEXA.  She has mammogram coming up in about a week.  An After Visit Summary was printed and given to the patient.  FOLLOW UP: Return in about 4 months (around 07/04/2016) for AWV.  Signed:  Crissie Sickles, MD           03/03/2016

## 2016-03-03 NOTE — Progress Notes (Signed)
Pre visit review using our clinic review tool, if applicable. No additional management support is needed unless otherwise documented below in the visit note. 

## 2016-03-04 LAB — HEPATITIS C ANTIBODY: HCV Ab: NEGATIVE

## 2016-03-07 ENCOUNTER — Ambulatory Visit
Admission: RE | Admit: 2016-03-07 | Discharge: 2016-03-07 | Disposition: A | Discharge status: Home / Self Care | Payer: Medicare Other | Source: Ambulatory Visit

## 2016-03-07 DIAGNOSIS — Z1231 Encounter for screening mammogram for malignant neoplasm of breast: Secondary | ICD-10-CM

## 2016-03-22 ENCOUNTER — Other Ambulatory Visit: Payer: Self-pay | Admitting: Family Medicine

## 2016-03-22 NOTE — Telephone Encounter (Signed)
RF request for humulin 70/30  LOV: 03/03/16 Next ov: 07/05/16 Last written: 06/05/15 #89m w/ 12RF

## 2016-04-06 ENCOUNTER — Ambulatory Visit
Admission: RE | Admit: 2016-04-06 | Discharge: 2016-04-06 | Disposition: A | Discharge status: Home / Self Care | Payer: Medicare Other | Source: Ambulatory Visit | Attending: Family Medicine | Admitting: Family Medicine

## 2016-04-06 DIAGNOSIS — Z780 Asymptomatic menopausal state: Secondary | ICD-10-CM | POA: Diagnosis not present

## 2016-04-06 DIAGNOSIS — M8589 Other specified disorders of bone density and structure, multiple sites: Secondary | ICD-10-CM | POA: Diagnosis not present

## 2016-04-06 DIAGNOSIS — E2839 Other primary ovarian failure: Secondary | ICD-10-CM

## 2016-04-11 ENCOUNTER — Encounter (INDEPENDENT_AMBULATORY_CARE_PROVIDER_SITE_OTHER): Payer: Medicare Other | Admitting: Ophthalmology

## 2016-04-20 DIAGNOSIS — H11153 Pinguecula, bilateral: Secondary | ICD-10-CM | POA: Diagnosis not present

## 2016-04-20 DIAGNOSIS — Z961 Presence of intraocular lens: Secondary | ICD-10-CM | POA: Diagnosis not present

## 2016-04-20 DIAGNOSIS — E11319 Type 2 diabetes mellitus with unspecified diabetic retinopathy without macular edema: Secondary | ICD-10-CM | POA: Diagnosis not present

## 2016-04-20 DIAGNOSIS — H18413 Arcus senilis, bilateral: Secondary | ICD-10-CM | POA: Diagnosis not present

## 2016-04-20 DIAGNOSIS — H401131 Primary open-angle glaucoma, bilateral, mild stage: Secondary | ICD-10-CM | POA: Diagnosis not present

## 2016-04-22 ENCOUNTER — Encounter (INDEPENDENT_AMBULATORY_CARE_PROVIDER_SITE_OTHER): Payer: Medicare Other | Admitting: Ophthalmology

## 2016-04-22 DIAGNOSIS — H59032 Cystoid macular edema following cataract surgery, left eye: Secondary | ICD-10-CM

## 2016-04-22 DIAGNOSIS — H35033 Hypertensive retinopathy, bilateral: Secondary | ICD-10-CM

## 2016-04-22 DIAGNOSIS — E11311 Type 2 diabetes mellitus with unspecified diabetic retinopathy with macular edema: Secondary | ICD-10-CM

## 2016-04-22 DIAGNOSIS — H43813 Vitreous degeneration, bilateral: Secondary | ICD-10-CM

## 2016-04-22 DIAGNOSIS — E113513 Type 2 diabetes mellitus with proliferative diabetic retinopathy with macular edema, bilateral: Secondary | ICD-10-CM | POA: Diagnosis not present

## 2016-04-22 DIAGNOSIS — I10 Essential (primary) hypertension: Secondary | ICD-10-CM | POA: Diagnosis not present

## 2016-06-14 ENCOUNTER — Emergency Department (HOSPITAL_COMMUNITY): Payer: Medicare Other

## 2016-06-14 ENCOUNTER — Encounter (HOSPITAL_COMMUNITY): Payer: Self-pay | Admitting: Emergency Medicine

## 2016-06-14 ENCOUNTER — Inpatient Hospital Stay (HOSPITAL_COMMUNITY): Payer: Medicare Other

## 2016-06-14 ENCOUNTER — Inpatient Hospital Stay (HOSPITAL_COMMUNITY)
Admission: EM | Admit: 2016-06-14 | Discharge: 2016-06-20 | DRG: 872 | Disposition: A | Discharge status: Home Health | Payer: Medicare Other | Attending: Internal Medicine | Admitting: Internal Medicine

## 2016-06-14 DIAGNOSIS — C3490 Malignant neoplasm of unspecified part of unspecified bronchus or lung: Secondary | ICD-10-CM

## 2016-06-14 DIAGNOSIS — Z87891 Personal history of nicotine dependence: Secondary | ICD-10-CM

## 2016-06-14 DIAGNOSIS — E785 Hyperlipidemia, unspecified: Secondary | ICD-10-CM | POA: Diagnosis not present

## 2016-06-14 DIAGNOSIS — K5792 Diverticulitis of intestine, part unspecified, without perforation or abscess without bleeding: Secondary | ICD-10-CM | POA: Diagnosis not present

## 2016-06-14 DIAGNOSIS — K5720 Diverticulitis of large intestine with perforation and abscess without bleeding: Secondary | ICD-10-CM | POA: Diagnosis present

## 2016-06-14 DIAGNOSIS — J449 Chronic obstructive pulmonary disease, unspecified: Secondary | ICD-10-CM | POA: Diagnosis present

## 2016-06-14 DIAGNOSIS — E782 Mixed hyperlipidemia: Secondary | ICD-10-CM | POA: Diagnosis present

## 2016-06-14 DIAGNOSIS — I959 Hypotension, unspecified: Secondary | ICD-10-CM | POA: Diagnosis not present

## 2016-06-14 DIAGNOSIS — A419 Sepsis, unspecified organism: Secondary | ICD-10-CM | POA: Diagnosis not present

## 2016-06-14 DIAGNOSIS — K5732 Diverticulitis of large intestine without perforation or abscess without bleeding: Secondary | ICD-10-CM | POA: Diagnosis present

## 2016-06-14 DIAGNOSIS — J438 Other emphysema: Secondary | ICD-10-CM

## 2016-06-14 DIAGNOSIS — S199XXA Unspecified injury of neck, initial encounter: Secondary | ICD-10-CM | POA: Diagnosis not present

## 2016-06-14 DIAGNOSIS — Z7951 Long term (current) use of inhaled steroids: Secondary | ICD-10-CM | POA: Diagnosis not present

## 2016-06-14 DIAGNOSIS — Z85118 Personal history of other malignant neoplasm of bronchus and lung: Secondary | ICD-10-CM | POA: Diagnosis not present

## 2016-06-14 DIAGNOSIS — Z7982 Long term (current) use of aspirin: Secondary | ICD-10-CM | POA: Diagnosis not present

## 2016-06-14 DIAGNOSIS — W19XXXA Unspecified fall, initial encounter: Secondary | ICD-10-CM

## 2016-06-14 DIAGNOSIS — J8410 Pulmonary fibrosis, unspecified: Secondary | ICD-10-CM | POA: Diagnosis not present

## 2016-06-14 DIAGNOSIS — S299XXA Unspecified injury of thorax, initial encounter: Secondary | ICD-10-CM | POA: Diagnosis not present

## 2016-06-14 DIAGNOSIS — I2510 Atherosclerotic heart disease of native coronary artery without angina pectoris: Secondary | ICD-10-CM | POA: Diagnosis present

## 2016-06-14 DIAGNOSIS — E1165 Type 2 diabetes mellitus with hyperglycemia: Secondary | ICD-10-CM | POA: Diagnosis present

## 2016-06-14 DIAGNOSIS — E11319 Type 2 diabetes mellitus with unspecified diabetic retinopathy without macular edema: Secondary | ICD-10-CM | POA: Diagnosis not present

## 2016-06-14 DIAGNOSIS — I313 Pericardial effusion (noninflammatory): Secondary | ICD-10-CM | POA: Diagnosis not present

## 2016-06-14 DIAGNOSIS — K219 Gastro-esophageal reflux disease without esophagitis: Secondary | ICD-10-CM | POA: Diagnosis present

## 2016-06-14 DIAGNOSIS — S0990XA Unspecified injury of head, initial encounter: Secondary | ICD-10-CM | POA: Diagnosis not present

## 2016-06-14 DIAGNOSIS — Z794 Long term (current) use of insulin: Secondary | ICD-10-CM | POA: Diagnosis not present

## 2016-06-14 DIAGNOSIS — R1031 Right lower quadrant pain: Secondary | ICD-10-CM | POA: Diagnosis not present

## 2016-06-14 LAB — URINALYSIS, ROUTINE W REFLEX MICROSCOPIC
BILIRUBIN URINE: NEGATIVE
Glucose, UA: NEGATIVE mg/dL
HGB URINE DIPSTICK: NEGATIVE
KETONES UR: NEGATIVE mg/dL
Leukocytes, UA: NEGATIVE
Nitrite: NEGATIVE
PROTEIN: NEGATIVE mg/dL
Specific Gravity, Urine: 1.019 (ref 1.005–1.030)
pH: 8 (ref 5.0–8.0)

## 2016-06-14 LAB — COMPREHENSIVE METABOLIC PANEL
ALK PHOS: 105 U/L (ref 38–126)
ALT: 19 U/L (ref 14–54)
ANION GAP: 8 (ref 5–15)
AST: 22 U/L (ref 15–41)
Albumin: 4 g/dL (ref 3.5–5.0)
BILIRUBIN TOTAL: 0.9 mg/dL (ref 0.3–1.2)
BUN: 9 mg/dL (ref 6–20)
CALCIUM: 9.3 mg/dL (ref 8.9–10.3)
CO2: 26 mmol/L (ref 22–32)
Chloride: 104 mmol/L (ref 101–111)
Creatinine, Ser: 0.8 mg/dL (ref 0.44–1.00)
GFR calc non Af Amer: >60 mL/min (ref >60)
Glucose, Bld: 133 mg/dL — ABNORMAL HIGH (ref 65–99)
Potassium: 4.3 mmol/L (ref 3.5–5.1)
Sodium: 138 mmol/L (ref 135–145)
TOTAL PROTEIN: 7.8 g/dL (ref 6.5–8.1)

## 2016-06-14 LAB — CBC WITH DIFFERENTIAL/PLATELET
BASOS PCT: 0 %
Basophils Absolute: 0 10*3/uL (ref 0.0–0.1)
EOS ABS: 0 10*3/uL (ref 0.0–0.7)
Eosinophils Relative: 0 %
HCT: 46 % (ref 36.0–46.0)
HEMOGLOBIN: 14.8 g/dL (ref 12.0–15.0)
Lymphocytes Relative: 6 %
Lymphs Abs: 1.1 10*3/uL (ref 0.7–4.0)
MCH: 29.8 pg (ref 26.0–34.0)
MCHC: 32.2 g/dL (ref 30.0–36.0)
MCV: 92.6 fL (ref 78.0–100.0)
MONOS PCT: 5 %
Monocytes Absolute: 0.9 10*3/uL (ref 0.1–1.0)
NEUTROS PCT: 89 %
Neutro Abs: 16.9 10*3/uL — ABNORMAL HIGH (ref 1.7–7.7)
PLATELETS: 218 10*3/uL (ref 150–400)
RBC: 4.97 MIL/uL (ref 3.87–5.11)
RDW: 14.9 % (ref 11.5–15.5)
WBC: 19 10*3/uL — AB (ref 4.0–10.5)

## 2016-06-14 LAB — PROTIME-INR
INR: 1.2
PROTHROMBIN TIME: 15.3 s — AB (ref 11.4–15.2)

## 2016-06-14 LAB — LACTIC ACID, PLASMA: LACTIC ACID, VENOUS: 1.3 mmol/L (ref 0.5–1.9)

## 2016-06-14 LAB — I-STAT CG4 LACTIC ACID, ED
LACTIC ACID, VENOUS: 1.86 mmol/L (ref 0.5–1.9)
LACTIC ACID, VENOUS: 1.85 mmol/L (ref 0.5–1.9)

## 2016-06-14 LAB — APTT: aPTT: 34 seconds (ref 24–36)

## 2016-06-14 LAB — GLUCOSE, CAPILLARY
GLUCOSE-CAPILLARY: 80 mg/dL (ref 65–99)
Glucose-Capillary: 124 mg/dL — ABNORMAL HIGH (ref 65–99)

## 2016-06-14 LAB — LIPASE, BLOOD: Lipase: 23 U/L (ref 11–51)

## 2016-06-14 LAB — PROCALCITONIN: PROCALCITONIN: 0.32 ng/mL

## 2016-06-14 MED ORDER — TIMOLOL MALEATE 0.5 % OP SOLN
1.0000 [drp] | Freq: Every morning | OPHTHALMIC | Status: DC
  Administered 2016-06-15 – 2016-06-20 (×6): 1 [drp] via OPHTHALMIC
  Filled 2016-06-14: qty 5

## 2016-06-14 MED ORDER — INSULIN ASPART PROT & ASPART (70-30 MIX) 100 UNIT/ML ~~LOC~~ SUSP
18.0000 [IU] | Freq: Every day | SUBCUTANEOUS | Status: DC
  Administered 2016-06-15 – 2016-06-20 (×4): 18 [IU] via SUBCUTANEOUS
  Filled 2016-06-14 (×2): qty 10

## 2016-06-14 MED ORDER — ATORVASTATIN CALCIUM 80 MG PO TABS
80.0000 mg | ORAL_TABLET | Freq: Every day | ORAL | Status: DC
  Administered 2016-06-15 – 2016-06-20 (×6): 80 mg via ORAL
  Filled 2016-06-14 (×6): qty 1

## 2016-06-14 MED ORDER — LORATADINE 10 MG PO TABS
10.0000 mg | ORAL_TABLET | Freq: Every day | ORAL | Status: DC
  Administered 2016-06-14 – 2016-06-19 (×6): 10 mg via ORAL
  Filled 2016-06-14 (×6): qty 1

## 2016-06-14 MED ORDER — PIPERACILLIN-TAZOBACTAM 3.375 G IVPB 30 MIN
3.3750 g | Freq: Once | INTRAVENOUS | Status: AC
  Administered 2016-06-14: 3.375 g via INTRAVENOUS
  Filled 2016-06-14: qty 50

## 2016-06-14 MED ORDER — PIPERACILLIN-TAZOBACTAM 3.375 G IVPB 30 MIN: 3.3750 g | Freq: Three times a day (TID) | INTRAVENOUS | Status: DC

## 2016-06-14 MED ORDER — ONDANSETRON HCL 4 MG/2ML IJ SOLN: 4.0000 mg | Freq: Four times a day (QID) | INTRAMUSCULAR | Status: DC | PRN

## 2016-06-14 MED ORDER — ONDANSETRON HCL 4 MG PO TABS: 4.0000 mg | ORAL_TABLET | Freq: Four times a day (QID) | ORAL | Status: DC | PRN

## 2016-06-14 MED ORDER — SODIUM CHLORIDE 0.9 % IV BOLUS (SEPSIS)
500.0000 mL | Freq: Once | INTRAVENOUS | Status: AC
  Administered 2016-06-14: 500 mL via INTRAVENOUS

## 2016-06-14 MED ORDER — ASPIRIN EC 81 MG PO TBEC
81.0000 mg | DELAYED_RELEASE_TABLET | Freq: Every day | ORAL | Status: DC
  Administered 2016-06-14 – 2016-06-19 (×6): 81 mg via ORAL
  Filled 2016-06-14 (×6): qty 1

## 2016-06-14 MED ORDER — ACETAMINOPHEN 650 MG RE SUPP: 650.0000 mg | Freq: Four times a day (QID) | RECTAL | Status: DC | PRN

## 2016-06-14 MED ORDER — MORPHINE SULFATE (PF) 4 MG/ML IV SOLN
4.0000 mg | Freq: Once | INTRAVENOUS | Status: AC
  Administered 2016-06-14: 4 mg via INTRAVENOUS
  Filled 2016-06-14: qty 1

## 2016-06-14 MED ORDER — KETOROLAC TROMETHAMINE 0.5 % OP SOLN
1.0000 [drp] | Freq: Four times a day (QID) | OPHTHALMIC | Status: DC
  Administered 2016-06-14 – 2016-06-20 (×23): 1 [drp] via OPHTHALMIC
  Filled 2016-06-14 (×2): qty 3

## 2016-06-14 MED ORDER — INSULIN ASPART 100 UNIT/ML ~~LOC~~ SOLN: 0.0000 [IU] | Freq: Every day | SUBCUTANEOUS | Status: DC

## 2016-06-14 MED ORDER — INSULIN ASPART 100 UNIT/ML ~~LOC~~ SOLN
0.0000 [IU] | Freq: Three times a day (TID) | SUBCUTANEOUS | Status: DC
  Administered 2016-06-15: 1 [IU] via SUBCUTANEOUS
  Administered 2016-06-15: 2 [IU] via SUBCUTANEOUS

## 2016-06-14 MED ORDER — PANTOPRAZOLE SODIUM 40 MG PO TBEC
40.0000 mg | DELAYED_RELEASE_TABLET | Freq: Every day | ORAL | Status: DC
  Administered 2016-06-15 – 2016-06-17 (×3): 40 mg via ORAL
  Filled 2016-06-14 (×3): qty 1

## 2016-06-14 MED ORDER — ENOXAPARIN SODIUM 40 MG/0.4ML ~~LOC~~ SOLN
40.0000 mg | SUBCUTANEOUS | Status: DC
  Administered 2016-06-14 – 2016-06-19 (×6): 40 mg via SUBCUTANEOUS
  Filled 2016-06-14 (×6): qty 0.4

## 2016-06-14 MED ORDER — INSULIN ASPART PROT & ASPART (70-30 MIX) 100 UNIT/ML ~~LOC~~ SUSP
16.0000 [IU] | Freq: Every day | SUBCUTANEOUS | Status: DC
  Administered 2016-06-15: 16 [IU] via SUBCUTANEOUS

## 2016-06-14 MED ORDER — PIPERACILLIN-TAZOBACTAM 3.375 G IVPB
3.3750 g | Freq: Three times a day (TID) | INTRAVENOUS | Status: DC
  Administered 2016-06-14 – 2016-06-19 (×14): 3.375 g via INTRAVENOUS
  Filled 2016-06-14 (×16): qty 50

## 2016-06-14 MED ORDER — SODIUM CHLORIDE 0.9 % IV SOLN
INTRAVENOUS | Status: DC
  Administered 2016-06-14 – 2016-06-19 (×8): via INTRAVENOUS

## 2016-06-14 MED ORDER — IOPAMIDOL (ISOVUE-300) INJECTION 61%
INTRAVENOUS | Status: AC
  Administered 2016-06-14: 100 mL via INTRAVENOUS
  Filled 2016-06-14: qty 100

## 2016-06-14 MED ORDER — FLUTICASONE PROPIONATE 50 MCG/ACT NA SUSP
2.0000 | Freq: Every evening | NASAL | Status: DC | PRN
  Filled 2016-06-14: qty 16

## 2016-06-14 MED ORDER — SODIUM CHLORIDE 0.9 % IV BOLUS (SEPSIS)
1000.0000 mL | Freq: Once | INTRAVENOUS | Status: AC
  Administered 2016-06-14: 1000 mL via INTRAVENOUS

## 2016-06-14 MED ORDER — ACETAMINOPHEN 325 MG PO TABS: 650.0000 mg | ORAL_TABLET | Freq: Four times a day (QID) | ORAL | Status: DC | PRN

## 2016-06-14 MED ORDER — PREDNISOLONE ACETATE 1 % OP SUSP
1.0000 [drp] | Freq: Four times a day (QID) | OPHTHALMIC | Status: DC
  Administered 2016-06-14 – 2016-06-20 (×23): 1 [drp] via OPHTHALMIC
  Filled 2016-06-14: qty 1

## 2016-06-14 NOTE — ED Notes (Signed)
Placed patient into a gown and on the monitor

## 2016-06-14 NOTE — ED Notes (Signed)
Lab called to inform them of add on urine culture

## 2016-06-14 NOTE — ED Provider Notes (Signed)
[facility] DEPT Provider Note   CSN: [id number] Arrival date & time: 06/14/16  0932     History   Chief Complaint Chief Complaint  Patient presents with  . Fall    HPI [name] is a 70 y.o. female.  70 year old female with past medical history including CAD, COPD, type 2 diabetes mellitus, mild memory loss, lung cancer who presents after a fall with abdominal pain, vomiting and diarrhea. Earlier this morning, the patient states that she rolled out of bed and fell onto the floor. She is not sure why she fell but denies any loss of consciousness. She denies any head injury, visual changes, or neck pain. She reports that sometime after the fall, she began having lower abdominal pain across her lower abdomen associated with vomiting and nonbloody diarrhea. She states that all symptoms began today. No fevers, chest pain, or shortness of breath. No sick contacts. No urinary symptoms.   The history is provided by the patient.  Fall     Past Medical History:  Diagnosis Date  . Age-related nuclear cataract of both eyes 2016   +cortical age related cataracts OU   . Allergy    seasonal  . Arthritis    hands  . Bilateral diabetic retinopathy (HCC) 2015   Dr. Zigmund Daniel  . Blood transfusion without reported diagnosis    when taking chemo  . CAD (coronary artery disease)    NON obstructive. Cath 2006 preserved LV fxn, scattered irregularities without critical stenosis. 2008 stress echo negative for ischemia, but with hypertensive response  . COPD (chronic obstructive pulmonary disease) ([place])   . Diabetes mellitus with complication (HCC)    diab retinopathy OU (laser)  . GERD (gastroesophageal reflux disease)    protonix  . History of diverticulitis of colon   . Hyperlipidemia   . Lung cancer ([place])    non-small cell lung ca, stage III in 05/2011; systemic chemotherapy concurrent with radiation followed by prophylactic cranial irradiation and has been observation since July of  2010 with no evidence for disease recurrence-released from onc f/u 12/2014 (needs annual cxr by PCP).  CXR 08/2015 stable.  . Mild cognitive impairment with memory loss    Likely from brain radiation therapy  . 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 Shirley Muscat, OD  . Osteopenia 03/2016   03/2016 DEXA T-score -2.2    Patient Active Problem List   Diagnosis Date Noted  . Non-occlusive coronary artery disease 06/06/2012  . COPD (chronic obstructive pulmonary disease) ([place]) 02/14/2011  . TOBACCO USE, QUIT 07/29/2009  . Small cell lung cancer ([place]) 01/18/2009  . Hyperlipidemia, mixed 05/22/2007  . DM (diabetes mellitus), type 2, uncontrolled with complications ([place]) 73/71/0626  . GERD 04/24/2007    Past Surgical History:  Procedure Laterality Date  . ABDOMINAL HYSTERECTOMY  1997  . APPENDECTOMY    . CARPAL TUNNEL RELEASE    . CATARACT EXTRACTION    . CHOLECYSTECTOMY  2002  . COLONOSCOPY  10/24/00   normal.  [employer] hemoccult testing via [place] 06/18/15 was NEG  . LEFT HEART CATHETERIZATION WITH CORONARY ANGIOGRAM N/A 05/30/2012   Procedure: LEFT HEART CATHETERIZATION WITH CORONARY ANGIOGRAM;  Surgeon: Burnell Blanks, MD;  Location: [facility] CATH LAB;  Service: Cardiovascular;  Laterality: N/A;    OB History    No data available       Home Medications    Prior to Admission medications   Medication Sig Start Date End Date Taking? Authorizing Provider  albuterol (PROVENTIL) (2.5  MG/3ML) 0.083% nebulizer solution USE ONE VIAL IN NEBULIZER EVERY 4 HOURS AS NEEDED FOR WHEEZING OR SHORTNESS OF BREATH 12/16/15  Yes [name], MD  aspirin 81 MG tablet Take 81 mg by mouth at bedtime.    Yes Historical Provider, MD  aspirin-acetaminophen-caffeine (EXCEDRIN MIGRAINE) [phone] MG per tablet Take 1 tablet by mouth every 6 (six) hours as needed for headache.   Yes Historical Provider, MD  atorvastatin (LIPITOR) 80 MG tablet Take 1 tablet (80 mg total) by mouth daily. 07/20/15  Yes [name], MD  FLOVENT HFA 220 MCG/ACT inhaler INHALE TWO PUFFS BY MOUTH TWICE DAILY 01/12/16  Yes [name], MD  fluticasone (FLONASE) 50 MCG/ACT nasal spray Place 2 sprays into both nostrils at bedtime. Patient taking differently: Place 2 sprays into both nostrils at bedtime as needed for allergies.  06/25/14  Yes [name], MD  glucose blood (ONE TOUCH ULTRA TEST) test strip TEST 3 TIMES A DAY 02/18/16  Yes [name], MD  Insulin Isophane & Regular Human (HUMULIN 70/30 KWIKPEN) (70-30) 100 UNIT/ML PEN Inject 38 units subcutaneously before breakfast and 34 units before supper 03/22/16  Yes [name], MD  loratadine (CLARITIN) 10 MG tablet Take 10 mg by mouth at bedtime.    Yes Historical Provider, MD  metFORMIN (GLUCOPHAGE) 500 MG tablet TAKE ONE TABLET BY MOUTH TWICE DAILY WITH MEALS 01/21/16  Yes [name], MD  [facility] DELICA LANCETS 03K MISC TEST 3 TIMES A DAY 02/18/16  Yes [name], MD  pantoprazole (PROTONIX) 40 MG tablet TAKE ONE TABLET BY MOUTH ONCE DAILY 10/15/15  Yes [name], MD  prednisoLONE acetate (PRED FORTE) 1 % ophthalmic suspension Place 1 drop into the left eye 4 (four) times daily. 01/18/16  Yes Historical Provider, MD  PROLENSA 0.07 % SOLN Place 1 drop into the left eye at bedtime. 02/22/16  Yes Historical Provider, MD  timolol (BETIMOL) 0.5 % ophthalmic solution Place 1 drop into both eyes every morning.   Yes Historical Provider, MD  nitroGLYCERIN (NITROSTAT) 0.4 MG SL tablet Place 1 tablet (0.4 mg total) under the tongue every 5 (five) minutes as needed. For chest pain 12/31/12   Lisabeth Pick, MD    Family History Family History  Problem Relation Age of Onset  . Heart failure Mother   . Kidney disease Mother     renal failure  . Diabetes Mother   . Diabetes Father   . Diabetes Brother   . Heart disease Brother   . Heart attack Brother   . Heart attack Brother   . Heart attack Brother   . Colon cancer Neg Hx     Social  History Social History  Substance Use Topics  . Smoking status: Former Smoker    Packs/day: 1.00    Years: 45.00    Types: Cigarettes    Quit date: 10/24/2008  . Smokeless tobacco: Never Used  . Alcohol use No     Allergies   Lactose intolerance (gi) and Ibuprofen   Review of Systems Review of Systems 10 Systems reviewed and are negative for acute change except as noted in the HPI.   Physical Exam Updated Vital Signs BP 93/66 (BP Location: Right Arm)   Pulse 119   Temp 98.7 F (37.1 C) (Oral)   Resp 26   Ht '5\' 2"'$  (1.575 m)   Wt 179 lb (81.2 kg)   SpO2 95%   BMI 32.74 kg/m  Physical Exam  Constitutional: She is oriented to person, place, and time. She appears well-developed and well-nourished. No distress.  HENT:  Head: Normocephalic and atraumatic.  Moist mucous membranes  Eyes: Conjunctivae are normal. Pupils are equal, round, and reactive to light.  Neck: Neck supple.  Cardiovascular: Regular rhythm and normal heart sounds.  Tachycardia present.   No murmur heard. Pulmonary/Chest: Effort normal. No respiratory distress. She has wheezes.  Occasional expiratory wheezes  Abdominal: Soft. Bowel sounds are normal. She exhibits no distension. There is tenderness. There is no rebound and no guarding.  Tenderness across lower abd in RLQ, LLQ, and suprapubic abd  Musculoskeletal: She exhibits no edema.  Neurological: She is alert and oriented to person, place, and time.  Fluent speech  Skin: Skin is warm and dry.  Psychiatric: She has a normal mood and affect. Judgment normal.  Nursing note and vitals reviewed.    ED Treatments / Results  Labs (all labs ordered are listed, but only abnormal results are displayed) Labs Reviewed  COMPREHENSIVE METABOLIC PANEL - Abnormal; Notable for the following:       Result Value   Glucose, Bld 133 (*)    All other components within normal limits  CBC WITH DIFFERENTIAL/PLATELET - Abnormal; Notable for the following:    WBC  19.0 (*)    Neutro Abs 16.9 (*)    All other components within normal limits  URINE CULTURE  CULTURE, BLOOD (ROUTINE X 2)  CULTURE, BLOOD (ROUTINE X 2)  LIPASE, BLOOD  URINALYSIS, ROUTINE W REFLEX MICROSCOPIC (NOT AT [facility])  I-STAT CG4 LACTIC ACID, ED  I-STAT CG4 LACTIC ACID, ED    EKG  EKG Interpretation None       Radiology Ct Head Wo Contrast  Result Date: 06/14/2016 CLINICAL DATA:  Un- witnessed fall EXAM: CT HEAD WITHOUT CONTRAST CT CERVICAL SPINE WITHOUT CONTRAST TECHNIQUE: Multidetector CT imaging of the head and cervical spine was performed following the standard protocol without intravenous contrast. Multiplanar CT image reconstructions of the cervical spine were also generated. COMPARISON:  03/02/2015 and PET scan 07/26/2010 FINDINGS: CT HEAD FINDINGS No skull fracture is noted. Paranasal sinuses and mastoid air cells are unremarkable. No intracranial hemorrhage, mass effect or midline shift. No acute cortical infarction. No mass lesion is noted on this unenhanced scan. Stable atrophy and chronic white matter disease. Ventricular size is stable from prior exam. CT CERVICAL SPINE FINDINGS Axial images of the cervical spine shows no acute fracture or subluxation. Mild degenerative changes are noted C1-C2 articulation. Computer processed images shows no acute fracture or subluxation. There is disc space flattening with mild anterior and mild posterior spurring at C4-C5 level. Mild anterior spurring at C5-C6 level. There is anterior spurring at T2-T3 level. No prevertebral soft tissue swelling. Cervical airway is patent. Emphysematous changes are noted in right upper lobe. There is consolidation with bronchiectasis and air bronchogram in right upper lobe medially. Although this may be due to chronic scarring and postradiation changes pneumonia cannot be excluded. Clinical correlation is necessary. Further evaluation with CT scan of the chest could be performed as clinically warranted.  IMPRESSION: 1. No acute intracranial abnormality. Stable atrophy and chronic white matter disease. 2. No cervical spine acute fracture or subluxation. Multilevel degenerative changes as described above. 3. Emphysematous changes are noted in right upper lobe. There is consolidation with bronchiectasis and air bronchogram in right upper lobe medially. Although this may be due to chronic scarring and postradiation changes pneumonia cannot be excluded. Clinical correlation is necessary.  Further evaluation with CT scan of the chest could be performed as clinically warranted. Electronically Signed   By: [name] M.D.   On: 06/14/2016 13:11   Ct Chest W Contrast  Result Date: 06/14/2016 CLINICAL DATA:  Fall out of bed this morning. Right lower quadrant pain. History of right lung cancer, post radiation and chemotherapy. EXAM: CT CHEST, ABDOMEN, AND PELVIS WITH CONTRAST TECHNIQUE: Multidetector CT imaging of the chest, abdomen and pelvis was performed following the standard protocol during bolus administration of intravenous contrast. CONTRAST:  1 ISOVUE-300 IOPAMIDOL (ISOVUE-300) INJECTION 61% COMPARISON:  Chest CT 09/14/2015.  Abdominal CT 12/30/2015. FINDINGS: Cardiovascular: Aortic atherosclerosis. No evidence of aneurysm. Heart is normal size. Small pericardial effusion. Mediastinum/Nodes: No mediastinal, hilar, or axillary adenopathy. Lungs/Pleura: Chronic opacities and volume loss noted in the right upper lobe extending from the right hilum to the right apex, stable since prior study compatible with postradiation fibrosis. No other confluent airspace opacities. No pleural effusions. No suspicious pulmonary nodule. Upper Abdomen: Prior cholecystectomy. No acute findings in the upper abdomen. Musculoskeletal: Chest wall soft tissues are unremarkable. No acute bony abnormality or focal bone lesion. There is for a gap insert or rib CT ABDOMEN PELVIS FINDINGS Hepatobiliary: Prior cholecystectomy. No focal hepatic  abnormality or biliary ductal dilatation. Pancreas: No focal abnormality or ductal dilatation. Spleen: No focal abnormality.  Normal size. Adrenals/Urinary Tract: No adrenal abnormality. No focal renal abnormality. No stones or hydronephrosis. Urinary bladder is unremarkable. Stomach/Bowel: Sigmoid diverticulosis. Extensive inflammatory changes around the sigmoid colon compatible with active diverticulitis. No focal fluid collection/abscess. Stomach and small bowel decompressed, grossly unremarkable. Vascular/Lymphatic: Scattered aortic and iliac calcifications. No aneurysm. No adenopathy. Reproductive: Prior hysterectomy.  No adnexal masses. Other: No free fluid or free air. Musculoskeletal: No acute bony abnormality or focal bone lesion. 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 No acute cardiopulmonary disease. Sigmoid diverticulosis with changes of active diverticulitis. Small pericardial effusion. Aortic atherosclerosis. Electronically Signed   By: Rolm Baptise M.D.   On: 06/14/2016 15:00   Ct Cervical Spine Wo Contrast  Result Date: 06/14/2016 CLINICAL DATA:  Un- witnessed fall EXAM: CT HEAD WITHOUT CONTRAST CT CERVICAL SPINE WITHOUT CONTRAST TECHNIQUE: Multidetector CT imaging of the head and cervical spine was performed following the standard protocol without intravenous contrast. Multiplanar CT image reconstructions of the cervical spine were also generated. COMPARISON:  03/02/2015 and PET scan 07/26/2010 FINDINGS: CT HEAD FINDINGS No skull fracture is noted. Paranasal sinuses and mastoid air cells are unremarkable. No intracranial hemorrhage, mass effect or midline shift. No acute cortical infarction. No mass lesion is noted on this unenhanced scan. Stable atrophy and chronic white matter disease. Ventricular size is stable from prior exam. CT CERVICAL SPINE FINDINGS Axial images of the cervical spine shows no acute fracture or subluxation. Mild degenerative changes are  noted C1-C2 articulation. Computer processed images shows no acute fracture or subluxation. There is disc space flattening with mild anterior and mild posterior spurring at C4-C5 level. Mild anterior spurring at C5-C6 level. There is anterior spurring at T2-T3 level. No prevertebral soft tissue swelling. Cervical airway is patent. Emphysematous changes are noted in right upper lobe. There is consolidation with bronchiectasis and air bronchogram in right upper lobe medially. Although this may be due to chronic scarring and postradiation changes pneumonia cannot be excluded. Clinical correlation is necessary. Further evaluation with CT scan of the chest could be performed as clinically warranted. IMPRESSION: 1. No acute intracranial abnormality. Stable atrophy and chronic white  matter disease. 2. No cervical spine acute fracture or subluxation. Multilevel degenerative changes as described above. 3. Emphysematous changes are noted in right upper lobe. There is consolidation with bronchiectasis and air bronchogram in right upper lobe medially. Although this may be due to chronic scarring and postradiation changes pneumonia cannot be excluded. Clinical correlation is necessary. Further evaluation with CT scan of the chest could be performed as clinically warranted. Electronically Signed   By: [name] M.D.   On: 06/14/2016 13:11   Ct Abdomen Pelvis W Contrast  Result Date: 06/14/2016 CLINICAL DATA:  Fall out of bed this morning. Right lower quadrant pain. History of right lung cancer, post radiation and chemotherapy. EXAM: CT CHEST, ABDOMEN, AND PELVIS WITH CONTRAST TECHNIQUE: Multidetector CT imaging of the chest, abdomen and pelvis was performed following the standard protocol during bolus administration of intravenous contrast. CONTRAST:  1 ISOVUE-300 IOPAMIDOL (ISOVUE-300) INJECTION 61% COMPARISON:  Chest CT 09/14/2015.  Abdominal CT 12/30/2015. FINDINGS: Cardiovascular: Aortic atherosclerosis. No evidence of  aneurysm. Heart is normal size. Small pericardial effusion. Mediastinum/Nodes: No mediastinal, hilar, or axillary adenopathy. Lungs/Pleura: Chronic opacities and volume loss noted in the right upper lobe extending from the right hilum to the right apex, stable since prior study compatible with postradiation fibrosis. No other confluent airspace opacities. No pleural effusions. No suspicious pulmonary nodule. Upper Abdomen: Prior cholecystectomy. No acute findings in the upper abdomen. Musculoskeletal: Chest wall soft tissues are unremarkable. No acute bony abnormality or focal bone lesion. There is for a gap insert or rib CT ABDOMEN PELVIS FINDINGS Hepatobiliary: Prior cholecystectomy. No focal hepatic abnormality or biliary ductal dilatation. Pancreas: No focal abnormality or ductal dilatation. Spleen: No focal abnormality.  Normal size. Adrenals/Urinary Tract: No adrenal abnormality. No focal renal abnormality. No stones or hydronephrosis. Urinary bladder is unremarkable. Stomach/Bowel: Sigmoid diverticulosis. Extensive inflammatory changes around the sigmoid colon compatible with active diverticulitis. No focal fluid collection/abscess. Stomach and small bowel decompressed, grossly unremarkable. Vascular/Lymphatic: Scattered aortic and iliac calcifications. No aneurysm. No adenopathy. Reproductive: Prior hysterectomy.  No adnexal masses. Other: No free fluid or free air. Musculoskeletal: No acute bony abnormality or focal bone lesion. 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 No acute cardiopulmonary disease. Sigmoid diverticulosis with changes of active diverticulitis. Small pericardial effusion. Aortic atherosclerosis. Electronically Signed   By: Rolm Baptise M.D.   On: 06/14/2016 15:00    Procedures Procedures (including critical care time)  Medications Ordered in ED Medications  iopamidol (ISOVUE-300) 61 % injection (100 mLs Intravenous Contrast Given 06/14/16 1421)    sodium chloride 0.9 % bolus 1,000 mL (1,000 mLs Intravenous New Bag/Given 06/14/16 1602)  piperacillin-tazobactam (ZOSYN) IVPB 3.375 g (0 g Intravenous Stopped 06/14/16 1638)  morphine 4 MG/ML injection 4 mg (4 mg Intravenous Given 06/14/16 1634)     Initial Impression / Assessment and Plan / ED Course  I have reviewed the triage vital signs and the nursing notes.  Pertinent labs & imaging results that were available during my care of the patient were reviewed by me and considered in my medical decision making (see chart for details).  Clinical Course   Patient presents after a fall out of bed this morning later followed by lower abdominal pain with vomiting and diarrhea. She was uncomfortable but nontoxic on exam. Initial vital signs notable for mild tachycardia, BP 103/58, patient afebrile. She had tenderness across her lower abdomen without distention or peritonitis. Patient declined pain medication. Obtained above labs which were notable for normal lactate and  UA, a BB C 19,000. Obtained CT of head and neck because of fall that was unwitnessed. CT negative for acute injury but did note abnormality and upper lung. Obtained CT of abdomen and pelvis because of her tenderness and leukocytosis. Also added chest CT to evaluate for infiltrate. CT showed diverticulitis without evidence of rupture or abscess. Stable postradiation fibrosis in lung.   On reexamination, the patient remained afebrile but has become more tachycardic and blood pressure has been borderline low at 93/66. Repeat lactate is normal, however I have added blood cultures and given her an IV fluid bolus, morphine, and Zosyn. Because of her advanced age, multiple comorbidities, and abnormal vital signs, I recommended admission. Discussed w/ hospitalist Dr. Hartford Poli and pt admitted for further care.  Final Clinical Impressions(s) / ED Diagnoses   Final diagnoses:  None    New Prescriptions New Prescriptions   No medications on file      Sharlett Iles, MD 06/14/16 1723

## 2016-06-14 NOTE — ED Notes (Signed)
Pressures look soft, Dr. Venora Maples aware  Admitting paged

## 2016-06-14 NOTE — ED Triage Notes (Signed)
Pt sts rolled out of bed this am and having lower abd pain and pressure

## 2016-06-14 NOTE — H&P (Signed)
History and Physical  FAYETTA SORENSON UMP:[id number] DOB: 1946/07/13 DOA: 06/14/2016   PCP: [name], MD   Patient coming from: Home  Chief Complaint: abdominal pain  HPI:  [name] is a 70 y.o. female with medical history of 70 year old female with a history of diabetes mellitus, non-small cell lung cancer, hyperlipidemia, GERD, CAD presented with 1 day history of abdominal pain. The patient states that she had a mechanical fall getting out of her bed on the evening of 06/13/2016.  Apparently she hit her head but did not have LOC.  When she woke up in the morning of 06/14/2016, her abdominal pain had worsened which prompted her to visit the emergency department. Patient had been in her usual state of health without any complaints. She denied any  fevers, chills, headache, chest pain, dyspnea,  dysuria, hematuria, hematochezia, and melena.  The patient had one episode of nausea and vomiting on 06/13/2016, but none since then. She has had 3 loose stools without hematochezia or melena, but none on the day of admission.  Workup in the emergency department included a CT of the abdomen and pelvis revealed acute diverticulitis of the sigmoid colon. 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 CT of the head and CT of the cervical spine were unremarkable. BMP and LFTs were unremarkable. WBC was 19.0, lactic acid 1.80.  The patient was given 1.5 L of normal saline and started on Zosyn.   Assessment/Plan: Sepsis -Present at the time of admission -Manifested by tachycardia, leukocytosis, and relative hypotension -Secondary to diverticulitis -Continue intravenous fluids -Cycle procalcitonin and lactic acid -Continue Zosyn pending blood culture data  Acute diverticulitis -Keep patient on clear liquids -Continue IV fluids -Continue intravenous Zosyn  Diabetes mellitus type 2 -Start half home dose of 70/30 -03/03/2016 hemoglobin A1c 7.5 -NovoLog sliding  scale -Hold metformin  Coronary artery disease -No chest pain presently -Continue aspirin  Hyperlipidemia -Continue statin  COPD -Stable on room air -45 -pack-year history of tobacco--quit in 2013       Past Medical History:  Diagnosis Date  . Age-related nuclear cataract of both eyes 2016   +cortical age related cataracts OU   . Allergy    seasonal  . Arthritis    hands  . Bilateral diabetic retinopathy (HCC) 2015   Dr. Zigmund Daniel  . Blood transfusion without reported diagnosis    when taking chemo  . CAD (coronary artery disease)    NON obstructive. Cath 2006 preserved LV fxn, scattered irregularities without critical stenosis. 2008 stress echo negative for ischemia, but with hypertensive response  . COPD (chronic obstructive pulmonary disease) ([place])   . Diabetes mellitus with complication (HCC)    diab retinopathy OU (laser)  . GERD (gastroesophageal reflux disease)    protonix  . History of diverticulitis of colon   . Hyperlipidemia   . Lung cancer ([place])    non-small cell lung ca, stage III in 05/2011; systemic chemotherapy concurrent with radiation followed by prophylactic cranial irradiation and has been observation since July of 2010 with no evidence for disease recurrence-released from onc f/u 12/2014 (needs annual cxr by PCP).  CXR 08/2015 stable.  . Mild cognitive impairment with memory loss    Likely from brain radiation therapy  . 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 Shirley Muscat, OD  . Osteopenia 03/2016   03/2016 DEXA T-score -2.2   Past Surgical History:  Procedure Laterality Date  . ABDOMINAL HYSTERECTOMY  1997  . APPENDECTOMY    . CARPAL TUNNEL RELEASE    . CATARACT EXTRACTION    . CHOLECYSTECTOMY  2002  . COLONOSCOPY  10/24/00   normal.  [employer] hemoccult testing via [place] 06/18/15 was NEG  . LEFT HEART CATHETERIZATION WITH CORONARY ANGIOGRAM N/A 05/30/2012   Procedure: LEFT HEART CATHETERIZATION WITH CORONARY ANGIOGRAM;  Surgeon: Burnell Blanks, MD;  Location: [facility] CATH LAB;  Service: Cardiovascular;  Laterality: N/A;   Social History:  reports that she quit smoking about 7 years ago. Her smoking use included Cigarettes. She has a 45.00 pack-year smoking history. She has never used smokeless tobacco. She reports that she does not drink alcohol or use drugs.   Family History  Problem Relation Age of Onset  . Heart failure Mother   . Kidney disease Mother     renal failure  . Diabetes Mother   . Diabetes Father   . Diabetes Brother   . Heart disease Brother   . Heart attack Brother   . Heart attack Brother   . Heart attack Brother   . Colon cancer Neg Hx      Allergies  Allergen Reactions  . Lactose Intolerance (Gi) Diarrhea  . Ibuprofen     REACTION: Anxiousness, hypoventilate      Prior to Admission medications   Medication Sig Start Date End Date Taking? Authorizing Provider  albuterol (PROVENTIL) (2.5 MG/3ML) 0.083% nebulizer solution USE ONE VIAL IN NEBULIZER EVERY 4 HOURS AS NEEDED FOR WHEEZING OR SHORTNESS OF BREATH 12/16/15  Yes [name], MD  aspirin 81 MG tablet Take 81 mg by mouth at bedtime.    Yes Historical Provider, MD  aspirin-acetaminophen-caffeine (EXCEDRIN MIGRAINE) [phone] MG per tablet Take 1 tablet by mouth every 6 (six) hours as needed for headache.   Yes Historical Provider, MD  atorvastatin (LIPITOR) 80 MG tablet Take 1 tablet (80 mg total) by mouth daily. 07/20/15  Yes [name], MD  FLOVENT HFA 220 MCG/ACT inhaler INHALE TWO PUFFS BY MOUTH TWICE DAILY 01/12/16  Yes [name], MD  fluticasone (FLONASE) 50 MCG/ACT nasal spray Place 2 sprays into both nostrils at bedtime. Patient taking differently: Place 2 sprays into both nostrils at bedtime as needed for allergies.  06/25/14  Yes [name], MD  glucose blood (ONE TOUCH ULTRA TEST) test strip TEST 3 TIMES A DAY 02/18/16  Yes [name], MD  Insulin Isophane & Regular Human (HUMULIN 70/30 KWIKPEN) (70-30)  100 UNIT/ML PEN Inject 38 units subcutaneously before breakfast and 34 units before supper 03/22/16  Yes [name], MD  loratadine (CLARITIN) 10 MG tablet Take 10 mg by mouth at bedtime.    Yes Historical Provider, MD  metFORMIN (GLUCOPHAGE) 500 MG tablet TAKE ONE TABLET BY MOUTH TWICE DAILY WITH MEALS 01/21/16  Yes [name], MD  [facility] DELICA LANCETS 41P MISC TEST 3 TIMES A DAY 02/18/16  Yes [name], MD  pantoprazole (PROTONIX) 40 MG tablet TAKE ONE TABLET BY MOUTH ONCE DAILY 10/15/15  Yes [name], MD  prednisoLONE acetate (PRED FORTE) 1 % ophthalmic suspension Place 1 drop into the left eye 4 (four) times daily. 01/18/16  Yes Historical Provider, MD  PROLENSA 0.07 % SOLN Place 1 drop into the left eye at bedtime. 02/22/16  Yes Historical Provider, MD  timolol (BETIMOL) 0.5 % ophthalmic solution Place 1 drop into both eyes every morning.   Yes Historical Provider, MD  nitroGLYCERIN (NITROSTAT) 0.4  MG SL tablet Place 1 tablet (0.4 mg total) under the tongue every 5 (five) minutes as needed. For chest pain 12/31/12   Lisabeth Pick, MD    Review of Systems:  Constitutional:  No weight loss, night sweats, Fevers, chills, fatigue.  Head&Eyes: No headache.  No vision loss.  No eye pain or scotoma ENT:  No Difficulty swallowing,Tooth/dental problems,Sore throat,  No ear ache, post nasal drip,  Cardio-vascular:  No chest pain, Orthopnea, PND, swelling in lower extremities,  dizziness, palpitations  GI:  No   diarrhea, loss of appetite, hematochezia, melena, heartburn, indigestion, Resp:  No shortness of breath with exertion or at rest. No cough. No coughing up of blood .No wheezing.No chest wall deformity  Skin:  no rash or lesions.  GU:  no dysuria, change in color of urine, no urgency or frequency. No flank pain.  Musculoskeletal:  No joint pain or swelling. No decreased range of motion. No back pain.  Psych:  No change in mood or affect. No depression or  anxiety. Neurologic: No headache, no dysesthesia, no focal weakness, no vision loss. No syncope  Physical Exam: Vitals:   06/14/16 1530 06/14/16 1530 06/14/16 1531 06/14/16 1715  BP: 93/66  93/66 110/55  Pulse: 118  119 103  Resp: '25  26 24  '$ Temp:  98.7 F (37.1 C)    TempSrc:  Oral    SpO2: 96%  95% 95%  Weight:      Height:       General:  A&O x 3, NAD, nontoxic, pleasant/cooperative Head/Eye: No conjunctival hemorrhage, no icterus, [place]/AT, No nystagmus ENT:  No icterus,  No thrush, good dentition, no pharyngeal exudate Neck:  No masses, no lymphadenpathy, no bruits CV:  RRR, no rub, no gallop, no S3 Lung:  CTAB, good air movement, no wheeze, no rhonchi Abdomen: soft/periumbilical, right lower quadrant, left lower quadrant pain without rebound, +BS, nondistended, no peritoneal signs Ext: No cyanosis, No rashes, No petechiae, No lymphangitis, No edema Neuro: CNII-XII intact, strength 4/5 in bilateral upper and lower extremities, no dysmetria  Labs on Admission:  Basic Metabolic Panel:  Recent Labs Lab 06/14/16 1158  NA 138  K 4.3  CL 104  CO2 26  GLUCOSE 133*  BUN 9  CREATININE 0.80  CALCIUM 9.3   Liver Function Tests:  Recent Labs Lab 06/14/16 1158  AST 22  ALT 19  ALKPHOS 105  BILITOT 0.9  PROT 7.8  ALBUMIN 4.0    Recent Labs Lab 06/14/16 1158  LIPASE 23   No results for input(s): AMMONIA in the last 168 hours. CBC:  Recent Labs Lab 06/14/16 1158  WBC 19.0*  NEUTROABS 16.9*  HGB 14.8  HCT 46.0  MCV 92.6  PLT 218   Coagulation Profile: No results for input(s): INR, PROTIME in the last 168 hours. Cardiac Enzymes: No results for input(s): CKTOTAL, CKMB, CKMBINDEX, TROPONINI in the last 168 hours. BNP: Invalid input(s): POCBNP CBG: No results for input(s): GLUCAP in the last 168 hours. Urine analysis:    Component Value Date/Time   COLORURINE YELLOW 06/14/2016 [place] 06/14/2016 1249   LABSPEC 1.019 06/14/2016 1249     PHURINE 8.0 06/14/2016 1249   GLUCOSEU NEGATIVE 06/14/2016 1249   HGBUR NEGATIVE 06/14/2016 1249   BILIRUBINUR NEGATIVE 06/14/2016 1249   KETONESUR NEGATIVE 06/14/2016 1249   PROTEINUR NEGATIVE 06/14/2016 1249   UROBILINOGEN 1.0 02/03/2013 0912   NITRITE NEGATIVE 06/14/2016 1249   LEUKOCYTESUR NEGATIVE 06/14/2016 1249   Sepsis  Labs: '@LABRCNTIP'$ (procalcitonin:4,lacticidven:4) )No results found for this or any previous visit (from the past 240 hour(s)).   Radiological Exams on Admission: Ct Head Wo Contrast  Result Date: 06/14/2016 CLINICAL DATA:  Un- witnessed fall EXAM: CT HEAD WITHOUT CONTRAST CT CERVICAL SPINE WITHOUT CONTRAST TECHNIQUE: Multidetector CT imaging of the head and cervical spine was performed following the standard protocol without intravenous contrast. Multiplanar CT image reconstructions of the cervical spine were also generated. COMPARISON:  03/02/2015 and PET scan 07/26/2010 FINDINGS: CT HEAD FINDINGS No skull fracture is noted. Paranasal sinuses and mastoid air cells are unremarkable. No intracranial hemorrhage, mass effect or midline shift. No acute cortical infarction. No mass lesion is noted on this unenhanced scan. Stable atrophy and chronic white matter disease. Ventricular size is stable from prior exam. CT CERVICAL SPINE FINDINGS Axial images of the cervical spine shows no acute fracture or subluxation. Mild degenerative changes are noted C1-C2 articulation. Computer processed images shows no acute fracture or subluxation. There is disc space flattening with mild anterior and mild posterior spurring at C4-C5 level. Mild anterior spurring at C5-C6 level. There is anterior spurring at T2-T3 level. No prevertebral soft tissue swelling. Cervical airway is patent. Emphysematous changes are noted in right upper lobe. There is consolidation with bronchiectasis and air bronchogram in right upper lobe medially. Although this may be due to chronic scarring and postradiation  changes pneumonia cannot be excluded. Clinical correlation is necessary. Further evaluation with CT scan of the chest could be performed as clinically warranted. IMPRESSION: 1. No acute intracranial abnormality. Stable atrophy and chronic white matter disease. 2. No cervical spine acute fracture or subluxation. Multilevel degenerative changes as described above. 3. Emphysematous changes are noted in right upper lobe. There is consolidation with bronchiectasis and air bronchogram in right upper lobe medially. Although this may be due to chronic scarring and postradiation changes pneumonia cannot be excluded. Clinical correlation is necessary. Further evaluation with CT scan of the chest could be performed as clinically warranted. Electronically Signed   By: [name] M.D.   On: 06/14/2016 13:11   Ct Chest W Contrast  Result Date: 06/14/2016 CLINICAL DATA:  Fall out of bed this morning. Right lower quadrant pain. History of right lung cancer, post radiation and chemotherapy. EXAM: CT CHEST, ABDOMEN, AND PELVIS WITH CONTRAST TECHNIQUE: Multidetector CT imaging of the chest, abdomen and pelvis was performed following the standard protocol during bolus administration of intravenous contrast. CONTRAST:  1 ISOVUE-300 IOPAMIDOL (ISOVUE-300) INJECTION 61% COMPARISON:  Chest CT 09/14/2015.  Abdominal CT 12/30/2015. FINDINGS: Cardiovascular: Aortic atherosclerosis. No evidence of aneurysm. Heart is normal size. Small pericardial effusion. Mediastinum/Nodes: No mediastinal, hilar, or axillary adenopathy. Lungs/Pleura: Chronic opacities and volume loss noted in the right upper lobe extending from the right hilum to the right apex, stable since prior study compatible with postradiation fibrosis. No other confluent airspace opacities. No pleural effusions. No suspicious pulmonary nodule. Upper Abdomen: Prior cholecystectomy. No acute findings in the upper abdomen. Musculoskeletal: Chest wall soft tissues are unremarkable. No  acute bony abnormality or focal bone lesion. There is for a gap insert or rib CT ABDOMEN PELVIS FINDINGS Hepatobiliary: Prior cholecystectomy. No focal hepatic abnormality or biliary ductal dilatation. Pancreas: No focal abnormality or ductal dilatation. Spleen: No focal abnormality.  Normal size. Adrenals/Urinary Tract: No adrenal abnormality. No focal renal abnormality. No stones or hydronephrosis. Urinary bladder is unremarkable. Stomach/Bowel: Sigmoid diverticulosis. Extensive inflammatory changes around the sigmoid colon compatible with active diverticulitis. No focal fluid collection/abscess. Stomach and small bowel  decompressed, grossly unremarkable. Vascular/Lymphatic: Scattered aortic and iliac calcifications. No aneurysm. No adenopathy. Reproductive: Prior hysterectomy.  No adnexal masses. Other: No free fluid or free air. Musculoskeletal: No acute bony abnormality or focal bone lesion. 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 No acute cardiopulmonary disease. Sigmoid diverticulosis with changes of active diverticulitis. Small pericardial effusion. Aortic atherosclerosis. Electronically Signed   By: Rolm Baptise M.D.   On: 06/14/2016 15:00   Ct Cervical Spine Wo Contrast  Result Date: 06/14/2016 CLINICAL DATA:  Un- witnessed fall EXAM: CT HEAD WITHOUT CONTRAST CT CERVICAL SPINE WITHOUT CONTRAST TECHNIQUE: Multidetector CT imaging of the head and cervical spine was performed following the standard protocol without intravenous contrast. Multiplanar CT image reconstructions of the cervical spine were also generated. COMPARISON:  03/02/2015 and PET scan 07/26/2010 FINDINGS: CT HEAD FINDINGS No skull fracture is noted. Paranasal sinuses and mastoid air cells are unremarkable. No intracranial hemorrhage, mass effect or midline shift. No acute cortical infarction. No mass lesion is noted on this unenhanced scan. Stable atrophy and chronic white matter disease. Ventricular  size is stable from prior exam. CT CERVICAL SPINE FINDINGS Axial images of the cervical spine shows no acute fracture or subluxation. Mild degenerative changes are noted C1-C2 articulation. Computer processed images shows no acute fracture or subluxation. There is disc space flattening with mild anterior and mild posterior spurring at C4-C5 level. Mild anterior spurring at C5-C6 level. There is anterior spurring at T2-T3 level. No prevertebral soft tissue swelling. Cervical airway is patent. Emphysematous changes are noted in right upper lobe. There is consolidation with bronchiectasis and air bronchogram in right upper lobe medially. Although this may be due to chronic scarring and postradiation changes pneumonia cannot be excluded. Clinical correlation is necessary. Further evaluation with CT scan of the chest could be performed as clinically warranted. IMPRESSION: 1. No acute intracranial abnormality. Stable atrophy and chronic white matter disease. 2. No cervical spine acute fracture or subluxation. Multilevel degenerative changes as described above. 3. Emphysematous changes are noted in right upper lobe. There is consolidation with bronchiectasis and air bronchogram in right upper lobe medially. Although this may be due to chronic scarring and postradiation changes pneumonia cannot be excluded. Clinical correlation is necessary. Further evaluation with CT scan of the chest could be performed as clinically warranted. Electronically Signed   By: [name] M.D.   On: 06/14/2016 13:11   Ct Abdomen Pelvis W Contrast  Result Date: 06/14/2016 CLINICAL DATA:  Fall out of bed this morning. Right lower quadrant pain. History of right lung cancer, post radiation and chemotherapy. EXAM: CT CHEST, ABDOMEN, AND PELVIS WITH CONTRAST TECHNIQUE: Multidetector CT imaging of the chest, abdomen and pelvis was performed following the standard protocol during bolus administration of intravenous contrast. CONTRAST:  1 ISOVUE-300  IOPAMIDOL (ISOVUE-300) INJECTION 61% COMPARISON:  Chest CT 09/14/2015.  Abdominal CT 12/30/2015. FINDINGS: Cardiovascular: Aortic atherosclerosis. No evidence of aneurysm. Heart is normal size. Small pericardial effusion. Mediastinum/Nodes: No mediastinal, hilar, or axillary adenopathy. Lungs/Pleura: Chronic opacities and volume loss noted in the right upper lobe extending from the right hilum to the right apex, stable since prior study compatible with postradiation fibrosis. No other confluent airspace opacities. No pleural effusions. No suspicious pulmonary nodule. Upper Abdomen: Prior cholecystectomy. No acute findings in the upper abdomen. Musculoskeletal: Chest wall soft tissues are unremarkable. No acute bony abnormality or focal bone lesion. There is for a gap insert or rib CT ABDOMEN PELVIS FINDINGS Hepatobiliary: Prior cholecystectomy. No focal hepatic  abnormality or biliary ductal dilatation. Pancreas: No focal abnormality or ductal dilatation. Spleen: No focal abnormality.  Normal size. Adrenals/Urinary Tract: No adrenal abnormality. No focal renal abnormality. No stones or hydronephrosis. Urinary bladder is unremarkable. Stomach/Bowel: Sigmoid diverticulosis. Extensive inflammatory changes around the sigmoid colon compatible with active diverticulitis. No focal fluid collection/abscess. Stomach and small bowel decompressed, grossly unremarkable. Vascular/Lymphatic: Scattered aortic and iliac calcifications. No aneurysm. No adenopathy. Reproductive: Prior hysterectomy.  No adnexal masses. Other: No free fluid or free air. Musculoskeletal: No acute bony abnormality or focal bone lesion. 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 No acute cardiopulmonary disease. Sigmoid diverticulosis with changes of active diverticulitis. Small pericardial effusion. Aortic atherosclerosis. Electronically Signed   By: Rolm Baptise M.D.   On: 06/14/2016 15:00        Time spent:60  minutes Code Status:   FULL Family Communication:  Son updated at bedside Disposition Plan: expect 2 day hospitalization Consults called: none DVT Prophylaxis:  Lovenox  Marysol Wellnitz, DO  [facility] Pager [phone]  If 7PM-7AM, please contact night-coverage [email or web address] Password [facility] 06/14/2016, 5:47 PM

## 2016-06-14 NOTE — Progress Notes (Signed)
Report received from ED nurse. Pt to be admitted to [place] room 23.

## 2016-06-14 NOTE — ED Notes (Signed)
Report called and accepted to 5W by Inocencio Homes

## 2016-06-15 DIAGNOSIS — Z794 Long term (current) use of insulin: Secondary | ICD-10-CM

## 2016-06-15 DIAGNOSIS — A419 Sepsis, unspecified organism: Principal | ICD-10-CM

## 2016-06-15 DIAGNOSIS — E1165 Type 2 diabetes mellitus with hyperglycemia: Secondary | ICD-10-CM

## 2016-06-15 DIAGNOSIS — I959 Hypotension, unspecified: Secondary | ICD-10-CM

## 2016-06-15 DIAGNOSIS — E785 Hyperlipidemia, unspecified: Secondary | ICD-10-CM

## 2016-06-15 DIAGNOSIS — K5720 Diverticulitis of large intestine with perforation and abscess without bleeding: Secondary | ICD-10-CM | POA: Diagnosis present

## 2016-06-15 DIAGNOSIS — K5792 Diverticulitis of intestine, part unspecified, without perforation or abscess without bleeding: Secondary | ICD-10-CM

## 2016-06-15 LAB — BASIC METABOLIC PANEL
Anion gap: 9 (ref 5–15)
BUN: 6 mg/dL (ref 6–20)
CHLORIDE: 105 mmol/L (ref 101–111)
CO2: 23 mmol/L (ref 22–32)
CREATININE: 0.64 mg/dL (ref 0.44–1.00)
Calcium: 8.1 mg/dL — ABNORMAL LOW (ref 8.9–10.3)
Glucose, Bld: 104 mg/dL — ABNORMAL HIGH (ref 65–99)
POTASSIUM: 3.4 mmol/L — AB (ref 3.5–5.1)
SODIUM: 137 mmol/L (ref 135–145)

## 2016-06-15 LAB — CBC
HCT: 36.2 % (ref 36.0–46.0)
Hemoglobin: 11.4 g/dL — ABNORMAL LOW (ref 12.0–15.0)
MCH: 28.8 pg (ref 26.0–34.0)
MCHC: 31.5 g/dL (ref 30.0–36.0)
MCV: 91.4 fL (ref 78.0–100.0)
PLATELETS: 188 10*3/uL (ref 150–400)
RBC: 3.96 MIL/uL (ref 3.87–5.11)
RDW: 15.1 % (ref 11.5–15.5)
WBC: 13.9 10*3/uL — AB (ref 4.0–10.5)

## 2016-06-15 LAB — URINE CULTURE: Culture: NO GROWTH

## 2016-06-15 LAB — GLUCOSE, CAPILLARY
GLUCOSE-CAPILLARY: 116 mg/dL — AB (ref 65–99)
Glucose-Capillary: 124 mg/dL — ABNORMAL HIGH (ref 65–99)
Glucose-Capillary: 145 mg/dL — ABNORMAL HIGH (ref 65–99)
Glucose-Capillary: 177 mg/dL — ABNORMAL HIGH (ref 65–99)
Glucose-Capillary: 59 mg/dL — ABNORMAL LOW (ref 65–99)

## 2016-06-15 LAB — LACTIC ACID, PLASMA: Lactic Acid, Venous: 1.3 mmol/L (ref 0.5–1.9)

## 2016-06-15 MED ORDER — INSULIN ASPART 100 UNIT/ML ~~LOC~~ SOLN: 0.0000 [IU] | Freq: Four times a day (QID) | SUBCUTANEOUS | Status: DC

## 2016-06-15 MED ORDER — DEXTROSE 50 % IV SOLN
1.0000 | INTRAVENOUS | Status: DC | PRN
  Administered 2016-06-15: 50 mL via INTRAVENOUS
  Filled 2016-06-15: qty 50

## 2016-06-15 MED ORDER — SODIUM CHLORIDE 0.9 % IV BOLUS (SEPSIS)
1000.0000 mL | Freq: Once | INTRAVENOUS | Status: AC
  Administered 2016-06-15: 1000 mL via INTRAVENOUS

## 2016-06-15 MED ORDER — ALBUTEROL SULFATE (2.5 MG/3ML) 0.083% IN NEBU
2.5000 mg | INHALATION_SOLUTION | RESPIRATORY_TRACT | Status: DC | PRN
Start: 2016-06-15 — End: 2016-06-20

## 2016-06-15 MED ORDER — INSULIN ASPART 100 UNIT/ML ~~LOC~~ SOLN
0.0000 [IU] | SUBCUTANEOUS | Status: DC
  Administered 2016-06-16 – 2016-06-17 (×2): 1 [IU] via SUBCUTANEOUS
  Administered 2016-06-17: 2 [IU] via SUBCUTANEOUS
  Administered 2016-06-17: 5 [IU] via SUBCUTANEOUS
  Administered 2016-06-18: 3 [IU] via SUBCUTANEOUS
  Administered 2016-06-18: 1 [IU] via SUBCUTANEOUS
  Administered 2016-06-18: 2 [IU] via SUBCUTANEOUS
  Administered 2016-06-19: 1 [IU] via SUBCUTANEOUS
  Administered 2016-06-19 (×3): 2 [IU] via SUBCUTANEOUS
  Administered 2016-06-19 (×2): 1 [IU] via SUBCUTANEOUS
  Administered 2016-06-20: 2 [IU] via SUBCUTANEOUS
  Administered 2016-06-20: 3 [IU] via SUBCUTANEOUS

## 2016-06-15 MED ORDER — ASPIRIN-ACETAMINOPHEN-CAFFEINE 250-250-65 MG PO TABS
1.0000 | ORAL_TABLET | Freq: Four times a day (QID) | ORAL | Status: DC | PRN
  Filled 2016-06-15: qty 1

## 2016-06-15 MED ORDER — INSULIN ASPART PROT & ASPART (70-30 MIX) 100 UNIT/ML ~~LOC~~ SUSP
12.0000 [IU] | Freq: Every day | SUBCUTANEOUS | Status: DC
  Administered 2016-06-16 – 2016-06-20 (×4): 12 [IU] via SUBCUTANEOUS

## 2016-06-15 MED ORDER — SODIUM CHLORIDE 0.9 % IV BOLUS (SEPSIS)
500.0000 mL | Freq: Once | INTRAVENOUS | Status: AC
  Administered 2016-06-15: 500 mL via INTRAVENOUS

## 2016-06-15 NOTE — Progress Notes (Signed)
Pt arrived floor with son and ED NTs. Pt admitted to floor, NP Baltazar Najjar was notified. Will continue to monitor.

## 2016-06-15 NOTE — Care Management Note (Addendum)
Case Management Note  Patient Details  Name: [name] MRN: [id number] Date of Birth: 01-Jul-1946  Subjective/Objective:  Admitted with sepsis 2/2  diverticulits, hx of diabetes mellitus, non-small cell lung cancer, hyperlipidemia, GERD, CAD. From home with husband/family. States independent with ADL's PTA with no DME usage.   PCP: Shawnie Dapper         Action/Plan: Return to home when medically stable. CM to f/u with disposition needs.  Expected Discharge Date:                  Expected Discharge Plan:  Home/Self Care (Resides with husband, [name])  In-House Referral:     Discharge planning Services  CM Consult  Post Acute Care Choice:    Choice offered to:   patient  DME Arranged:   3 in1/bsc, and rolator DME Agency:   [facility]/ referral made with [facility] @ [phone]  [facility] Arranged:   [place] referral made with Butch Penny @ [phone].  Status of Service:  In process, will continue to follow  If discussed at Long Length of Stay Meetings, dates discussed:    Additional Comments:  Sharin Mons, [place] [phone] 06/15/2016, 12:23 PM

## 2016-06-15 NOTE — Evaluation (Signed)
Physical Therapy Evaluation Patient Details Name: [name] MRN: [id number] DOB: 1946-01-09 Today's Date: 06/15/2016   History of Present Illness  [name] is a 70 y.o. female with medical history of 70 year old female with a history of diabetes mellitus, non-small cell lung cancer, hyperlipidemia, GERD, CAD presented with 1 day history of abdominal pain. The patient states that she had a mechanical fall getting out of her bed on the evening of 06/13/2016.  Apparently she hit her head but did not have LOC.  When she woke up in the morning of 06/14/2016, her abdominal pain had worsened which prompted her to visit the emergency department.  Clinical Impression  Pt admitted with above diagnosis. Pt currently with functional limitations due to the deficits listed below (see PT Problem List). Pt deconditioned with impaired balance and a high falls risk. Pt also with significant DOE however SpO2 at 99% on RA.  Pt will benefit from skilled PT to increase their independence and safety with mobility to allow discharge to the venue listed below.       Follow Up Recommendations Home health PT;Supervision/Assistance - 24 hour    Equipment Recommendations  Rolling walker with 5" wheels;3in1 (PT) (pt would like a rollator)    Recommendations for Other Services       Precautions / Restrictions Precautions Precautions: Fall Precaution Comments: abdominal pain Restrictions Weight Bearing Restrictions: No      Mobility  Bed Mobility Overal bed mobility: Needs Assistance Bed Mobility: Rolling;Sidelying to Sit Rolling: Supervision Sidelying to sit: Mod assist       General bed mobility comments: pt unable to maintain sitting EOB, pt repeated falling backwards or to the right requiring modA to maintain balance  Transfers Overall transfer level: Needs assistance Equipment used: Rolling walker (2 wheeled) Transfers: Sit to/from Stand Sit to Stand: Min assist         General  transfer comment: v/c's for safety and hand placement  Ambulation/Gait Ambulation/Gait assistance: Min guard Ambulation Distance (Feet): 10 Feet Assistive device: Rolling walker (2 wheeled) Gait Pattern/deviations: Step-through pattern;Decreased stride length Gait velocity: slow Gait velocity interpretation: Below normal speed for age/gender General Gait Details: pt with SOB, SpO2 at 99% on RA. pt fatigued quickly limiting amb around bed to chair. pt reports "i want the walker with the seat"  Stairs            Wheelchair Mobility    Modified Rankin (Stroke Patients Only)       Balance Overall balance assessment: Needs assistance Sitting-balance support: Feet supported;No upper extremity supported Sitting balance-Leahy Scale: Poor Sitting balance - Comments: pt unable to maintain balance  Postural control: Posterior lean;Right lateral lean   Standing balance-Leahy Scale: Poor Standing balance comment: requires use of RW to maintain balance                             Pertinent Vitals/Pain Pain Assessment: 0-10 Pain Score: 8  Pain Location: abdomen Pain Descriptors / Indicators: Discomfort Pain Intervention(s): Monitored during session    Home Living Family/patient expects to be discharged to:: Private residence Living Arrangements: Spouse/significant other;Children (daughter and grandson) Available Help at Discharge: Family;Available 24 hours/day Type of Home: House Home Access: Stairs to enter Entrance Stairs-Rails: None Entrance Stairs-Number of Steps: 3 Home Layout: One level Home Equipment: None      Prior Function Level of Independence: Independent         Comments: pt does the laundry,  dtr does the cooking, doesn't drive     Hand Dominance   Dominant Hand: Right    Extremity/Trunk Assessment   Upper Extremity Assessment: Generalized weakness           Lower Extremity Assessment: Generalized weakness      Cervical /  Trunk Assessment: Normal  Communication   Communication: No difficulties  Cognition Arousal/Alertness: Awake/alert Behavior During Therapy: WFL for tasks assessed/performed Overall Cognitive Status: Within Functional Limits for tasks assessed                      General Comments General comments (skin integrity, edema, etc.): pt received on bed pan. pt minA to roll to remove bed pain    Exercises        Assessment/Plan    PT Assessment Patient needs continued PT services  PT Diagnosis Difficulty walking;Generalized weakness   PT Problem List Decreased strength;Decreased range of motion;Decreased activity tolerance;Decreased balance;Decreased mobility;Decreased safety awareness;Decreased knowledge of use of DME  PT Treatment Interventions DME instruction;Gait training;Stair training;Functional mobility training;Therapeutic activities;Therapeutic exercise;Balance training;Neuromuscular re-education   PT Goals (Current goals can be found in the Care Plan section) Acute Rehab PT Goals Patient Stated Goal: home asap PT Goal Formulation: With patient Time For Goal Achievement: 06/22/16 Potential to Achieve Goals: Fair    Frequency Min 3X/week   Barriers to discharge        Co-evaluation               End of Session Equipment Utilized During Treatment: Gait belt Activity Tolerance: Patient tolerated treatment well Patient left: in chair;with call bell/phone within reach;with chair alarm set Nurse Communication: Mobility status         Time: 3557-3220 PT Time Calculation (min) (ACUTE ONLY): 30 min   Charges:   PT Evaluation $PT Eval Moderate Complexity: 1 Procedure PT Treatments $Gait Training: 8-22 mins   PT G CodesKingsley Callander 06/15/2016, 3:32 PM   Kittie Plater, PT, DPT Pager #: [phone] Office #: [phone]

## 2016-06-15 NOTE — Evaluation (Signed)
Occupational Therapy Evaluation Patient Details Name: BRYSTAL KILDOW MRN: [id number] DOB: December 08, 1945 Today's Date: 06/15/2016    History of Present Illness NYNA CHILTON is a 70 y.o. female with medical history of 70 year old female with a history of diabetes mellitus, non-small cell lung cancer, hyperlipidemia, GERD, CAD presented with 1 day history of abdominal pain. The patient states that she had a mechanical fall getting out of her bed on the evening of 06/13/2016.  Apparently she hit her head but did not have LOC.  When she woke up in the morning of 06/14/2016, her abdominal pain had worsened which prompted her to visit the emergency department.   Clinical Impression   Pt reports being independent in self care and some housekeeping activities, although completing standing activities required an extended period of time due to need to take rest breaks. Pt presents with generalized weakness, poor activity tolerance and impaired balance. Will follow acutely.    Follow Up Recommendations  Home health OT    Equipment Recommendations  3 in 1 bedside comode    Recommendations for Other Services       Precautions / Restrictions Precautions Precautions: Fall Precaution Comments: abdominal pain Restrictions Weight Bearing Restrictions: No      Mobility Bed Mobility Overal bed mobility: Needs Assistance Bed Mobility: Rolling;Sidelying to Sit;Sit to Sidelying Rolling: Supervision Sidelying to sit: Mod assist     Sit to sidelying: Min assist General bed mobility comments: pt assisted for trunk with side to sit and with LEs with return to sidelying  Transfers Overall transfer level: Needs assistance Equipment used: 1 person hand held assist Transfers: Sit to/from Stand;Stand Pivot Transfers Sit to Stand: Min assist Stand pivot transfers: Min assist       General transfer comment: assist to rise and for balance    Balance Overall balance assessment: Needs  assistance Sitting-balance support: Feet supported;No upper extremity supported Sitting balance-Leahy Scale: Fair Sitting balance - Comments: in chair Postural control: Posterior lean;Right lateral lean at EOB   Standing balance-Leahy Scale: Poor Standing balance comment: requires hand held assist to maintain balance                            ADL Overall ADL's : Needs assistance/impaired Eating/Feeding: Independent;Sitting Eating/Feeding Details (indicate cue type and reason): pt unhappy on clear liquids Grooming: Sitting;Set up;Brushing hair   Upper Body Bathing: Sitting;Minimal assitance   Lower Body Bathing: Minimal assistance;Sit to/from stand   Upper Body Dressing : Minimal assistance;Sitting   Lower Body Dressing: Minimal assistance;Sit to/from stand                 General ADL Comments: Began education in energy conservation. Recommended pt use her husband's shower seat. Recommended 3 in 1.     Vision     Perception     Praxis      Pertinent Vitals/Pain Pain Assessment: Faces  Faces Pain Scale: Hurts even more Pain Location: abdomen Pain Descriptors / Indicators: Discomfort Pain Intervention(s): Monitored during session;Repositioned     Hand Dominance Right   Extremity/Trunk Assessment Upper Extremity Assessment Upper Extremity Assessment: Generalized weakness   Lower Extremity Assessment Lower Extremity Assessment: Defer to PT evaluation   Cervical / Trunk Assessment Cervical / Trunk Assessment: Normal   Communication Communication Communication: No difficulties   Cognition Arousal/Alertness: Awake/alert Behavior During Therapy: WFL for tasks assessed/performed Overall Cognitive Status: Within Functional Limits for tasks assessed  General Comments       Exercises       Shoulder Instructions      Home Living Family/patient expects to be discharged to:: Private residence Living Arrangements:  Spouse/significant other;Children (daughter and 39 yo grandson) Available Help at Discharge: Family;Available 24 hours/day Type of Home: House Home Access: Stairs to enter [employer] of Steps: 3 Entrance Stairs-Rails: None Home Layout: One level     Bathroom Shower/Tub: Teacher, early years/pre: Standard     Home Equipment: Shower seat          Prior Functioning/Environment Level of Independence: Independent        Comments: pt does the laundry and washes dishes, dtr does the cooking, doesn't drive, pt reports having to sit frequently when washing dishes    OT Diagnosis: Generalized weakness;Acute pain   OT Problem List: Decreased strength;Decreased activity tolerance;Impaired balance (sitting and/or standing);Decreased knowledge of use of DME or AE;Pain;Cardiopulmonary status limiting activity   OT Treatment/Interventions: Self-care/ADL training;Energy conservation;DME and/or AE instruction;Patient/family education;Balance training;Therapeutic exercise    OT Goals(Current goals can be found in the care plan section) Acute Rehab OT Goals Patient Stated Goal: home asap OT Goal Formulation: With patient Time For Goal Achievement: 06/22/16 Potential to Achieve Goals: Good ADL Goals Pt Will Perform Grooming: with supervision;standing (2 activities) Pt Will Perform Upper Body Dressing: with set-up;sitting (in chair ) Pt Will Perform Lower Body Dressing: sit to/from stand;with supervision (from chair) Pt Will Transfer to Toilet: with supervision;ambulating;bedside commode (over toilet) Pt Will Perform Toileting - Clothing Manipulation and hygiene: with supervision;sit to/from stand Pt Will Perform Tub/Shower Transfer: Tub transfer;with supervision;ambulating;shower seat;rolling walker Pt/caregiver will Perform Home Exercise Program: Increased strength;Both right and left upper extremity;With theraband;With written HEP provided;Independently (level  2) Additional ADL Goal #1: Pt will generalize energy conservation strategies in ADL independently.  OT Frequency: Min 2X/week   Barriers to D/C:            Co-evaluation              End of Session Equipment Utilized During Treatment: Gait belt  Activity Tolerance: Patient limited by fatigue Patient left: in bed;with call bell/phone within reach   Time: 8527-7824 OT Time Calculation (min): 23 min Charges:  OT General Charges $OT Visit: 1 Procedure OT Evaluation $OT Eval Moderate Complexity: 1 Procedure G-Codes:    Malka So 06/15/2016, 4:40 PM  [phone]

## 2016-06-15 NOTE — Progress Notes (Signed)
PROGRESS NOTE    [name]  YFV:[id number] DOB: 11/22/1945 DOA: 06/14/2016 PCP: [name], MD (Confirm with patient/family/NH records and if not entered, this HAS to be entered at [facility] point of entry. "No PCP" if truly none.)   Brief Narrative:  [name] is a 70 y.o. female with medical history of 70 year old female with a history of diabetes mellitus, non-small cell lung cancer, hyperlipidemia, GERD, CAD presented with 1 day history of abdominal pain. The patient states that she had a mechanical fall getting out of her bed on the evening of 06/13/2016.  Apparently she hit her head but did not have LOC.  When she woke up in the morning of 06/14/2016, her abdominal pain had worsened which prompted her to visit the emergency department. Patient had been in her usual state of health without any complaints. She denied any  fevers, chills, headache, chest pain, dyspnea,  dysuria, hematuria, hematochezia, and melena.  The patient had one episode of nausea and vomiting on 06/13/2016, but none since then. She has had 3 loose stools without hematochezia or melena, but none on the day of admission.  Workup in the emergency department included a CT of the abdomen and pelvis revealed acute diverticulitis of the sigmoid colon. 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 CT of the head and CT of the cervical spine were unremarkable. BMP and LFTs were unremarkable. WBC was 19.0, lactic acid 1.80.  The patient was given 1.5 L of normal saline and started on Zosyn.   Assessment & Plan:   Principal Problem:   Diverticulitis of sigmoid colon Active Problems:   Sepsis ([place])   GERD   Acute diverticulitis   Controlled type 2 diabetes mellitus with hyperglycemia, with long-term current use of insulin ([place])   Hyperlipidemia   Arterial hypotension  #1 acute sigmoid diverticulitis Patient 70 year old female presented to the ED with 1 day history of abdominal pain. CT  abdomen and pelvis consistent with acute diverticulitis of the sigmoid colon. Patient noted to have a leukocytosis with a white count of 19 and a lactic acid level I.80. Patient also noted to have borderline blood pressure. Patient does state abdominal pain improved from admission however patient still with significant diffuse abdominal pain. Leukocytosis trending down with a white count of 13.9. Lactic acid level at 1.3. Pro-calcitonin of 0.32. Will make patient nothing by mouth. Continue empiric IV Zosyn. Follow.  #2 sepsis Sepsis secondary to acute diverticulitis. Patient met criteria for sepsis on admission with tachycardia, leukocytosis and borderline blood pressure/hypotension. Patient currently afebrile. Leukocytosis trending down. Lactic acid level trending down. Patient has been pancultured results pending. Will make patient nothing by mouth. Continue IV Zosyn. IV fluids.  #3 hypotension/borderline blood pressure Secondary to problem #1 and 2. Patient has been pancultured. Increase IV fluids. Give fluid bolus. Follow. Continue empiric IV antibiotics.  #4 diabetes mellitus type 2 Hemoglobin A1c was 7.50 03/03/2016. Hold oral hypoglycemic agents. Continue half home dose 70/30. Sliding scale insulin.  #5 coronary artery disease Stable. Patient currently asymptomatic. Continue aspirin and statin.  #6 hyperlipidemia Continue statin.  #7 COPD Stable. Continue PPI, Claritin, Flovent. Place on albuterol nebs as needed.   DVT prophylaxis: Lovenox Code Status: Full Family Communication: Updated patient. No family at bedside. Disposition Plan: Home when medically stable and tolerating a solid diet with no further abdominal pain or significant improvement with abdominal pain.   Consultants:   None  Procedures:   CT abdomen  and pelvis 06/14/2016  CT chest 06/14/2016  CT head 06/14/2016  Antimicrobials:   IV Zosyn 06/14/2016   Subjective: Patient states hasn't really eating  that much of the clear liquids. Patient states abdominal pain improved from admission however still diffuse 3 tender to palpation. No nausea. No vomiting. No shortness of breath. No chest pain.  Objective: Vitals:   06/14/16 1953 06/15/16 0549 06/15/16 0939 06/15/16 1534  BP: 122/62 (!) 81/39 (!) 101/48 (!) 96/55  Pulse: (!) 101 98 98 (!) 108  Resp: 18 16    Temp: 97.5 F (36.4 C) 99.4 F (37.4 C) 99.3 F (37.4 C) 98.4 F (36.9 C)  TempSrc: Oral Oral    SpO2: 96% 97% 96% 98%  Weight:      Height:        Intake/Output Summary (Last 24 hours) at 06/15/16 1912 Last data filed at 06/15/16 1855  Gross per 24 hour  Intake          2518.75 ml  Output             2925 ml  Net          -406.25 ml   Filed Weights   06/14/16 0941  Weight: 81.2 kg (179 lb)    Examination:  General exam: Appears calm and comfortable  Respiratory system: Clear to auscultation. Respiratory effort normal. Cardiovascular system: S1 & S2 heard, RRR. No JVD, murmurs, rubs, gallops or clicks. No pedal edema. Gastrointestinal system: Abdomen is nondistended, soft and diffuse tender to palpation. Positive bowel sounds.  Central nervous system: Alert and oriented. No focal neurological deficits. Extremities: Symmetric 5 x 5 power. Skin: No rashes, lesions or ulcers Psychiatry: Judgement and insight appear normal. Mood & affect appropriate.     Data Reviewed: I have personally reviewed following labs and imaging studies  CBC:  Recent Labs Lab 06/14/16 1158 06/15/16 0526  WBC 19.0* 13.9*  NEUTROABS 16.9*  --   HGB 14.8 11.4*  HCT 46.0 36.2  MCV 92.6 91.4  PLT 218 983   Basic Metabolic Panel:  Recent Labs Lab 06/14/16 1158 06/15/16 0526  NA 138 137  K 4.3 3.4*  CL 104 105  CO2 26 23  GLUCOSE 133* 104*  BUN 9 6  CREATININE 0.80 0.64  CALCIUM 9.3 8.1*   GFR: Estimated Creatinine Clearance: 67.3 mL/min (by C-G formula based on SCr of 0.8 mg/dL). Liver Function Tests:  Recent Labs Lab  06/14/16 1158  AST 22  ALT 19  ALKPHOS 105  BILITOT 0.9  PROT 7.8  ALBUMIN 4.0    Recent Labs Lab 06/14/16 1158  LIPASE 23   No results for input(s): AMMONIA in the last 168 hours. Coagulation Profile:  Recent Labs Lab 06/14/16 2032  INR 1.20   Cardiac Enzymes: No results for input(s): CKTOTAL, CKMB, CKMBINDEX, TROPONINI in the last 168 hours. BNP (last 3 results) No results for input(s): PROBNP in the last 8760 hours. HbA1C: No results for input(s): HGBA1C in the last 72 hours. CBG:  Recent Labs Lab 06/14/16 2059 06/14/16 2247 06/15/16 0802 06/15/16 1219 06/15/16 1753  GLUCAP 80 124* 116* 177* 124*   Lipid Profile: No results for input(s): CHOL, HDL, LDLCALC, TRIG, CHOLHDL, LDLDIRECT in the last 72 hours. Thyroid Function Tests: No results for input(s): TSH, T4TOTAL, FREET4, T3FREE, THYROIDAB in the last 72 hours. Anemia Panel: No results for input(s): VITAMINB12, FOLATE, FERRITIN, TIBC, IRON, RETICCTPCT in the last 72 hours. Sepsis Labs:  Recent Labs Lab 06/14/16 1220 06/14/16 1652  06/14/16 2032 06/15/16 0014  PROCALCITON  --   --  0.32  --   LATICACIDVEN 1.86 1.85 1.3 1.3    Recent Results (from the past 240 hour(s))  Urine culture     Status: None   Collection Time: 06/14/16 12:49 PM  Result Value Ref Range Status   Specimen Description URINE, CLEAN CATCH  Final   Special Requests NONE  Final   Culture NO GROWTH  Final   Report Status 06/15/2016 FINAL  Final  Culture, blood (routine x 2)     Status: None (Preliminary result)   Collection Time: 06/14/16  4:00 PM  Result Value Ref Range Status   Specimen Description BLOOD LEFT ANTECUBITAL  Final   Special Requests BOTTLES DRAWN AEROBIC AND ANAEROBIC 5CC  Final   Culture NO GROWTH < 24 HOURS  Final   Report Status PENDING  Incomplete  Culture, blood (routine x 2)     Status: None (Preliminary result)   Collection Time: 06/14/16  4:04 PM  Result Value Ref Range Status   Specimen Description  BLOOD RIGHT ANTECUBITAL  Final   Special Requests BOTTLES DRAWN AEROBIC ONLY 5CC  Final   Culture NO GROWTH < 24 HOURS  Final   Report Status PENDING  Incomplete         Radiology Studies: Ct Head Wo Contrast  Result Date: 06/14/2016 CLINICAL DATA:  Un- witnessed fall EXAM: CT HEAD WITHOUT CONTRAST CT CERVICAL SPINE WITHOUT CONTRAST TECHNIQUE: Multidetector CT imaging of the head and cervical spine was performed following the standard protocol without intravenous contrast. Multiplanar CT image reconstructions of the cervical spine were also generated. COMPARISON:  03/02/2015 and PET scan 07/26/2010 FINDINGS: CT HEAD FINDINGS No skull fracture is noted. Paranasal sinuses and mastoid air cells are unremarkable. No intracranial hemorrhage, mass effect or midline shift. No acute cortical infarction. No mass lesion is noted on this unenhanced scan. Stable atrophy and chronic white matter disease. Ventricular size is stable from prior exam. CT CERVICAL SPINE FINDINGS Axial images of the cervical spine shows no acute fracture or subluxation. Mild degenerative changes are noted C1-C2 articulation. Computer processed images shows no acute fracture or subluxation. There is disc space flattening with mild anterior and mild posterior spurring at C4-C5 level. Mild anterior spurring at C5-C6 level. There is anterior spurring at T2-T3 level. No prevertebral soft tissue swelling. Cervical airway is patent. Emphysematous changes are noted in right upper lobe. There is consolidation with bronchiectasis and air bronchogram in right upper lobe medially. Although this may be due to chronic scarring and postradiation changes pneumonia cannot be excluded. Clinical correlation is necessary. Further evaluation with CT scan of the chest could be performed as clinically warranted. IMPRESSION: 1. No acute intracranial abnormality. Stable atrophy and chronic white matter disease. 2. No cervical spine acute fracture or subluxation.  Multilevel degenerative changes as described above. 3. Emphysematous changes are noted in right upper lobe. There is consolidation with bronchiectasis and air bronchogram in right upper lobe medially. Although this may be due to chronic scarring and postradiation changes pneumonia cannot be excluded. Clinical correlation is necessary. Further evaluation with CT scan of the chest could be performed as clinically warranted. Electronically Signed   By: [name] M.D.   On: 06/14/2016 13:11   Ct Chest W Contrast  Result Date: 06/14/2016 CLINICAL DATA:  Fall out of bed this morning. Right lower quadrant pain. History of right lung cancer, post radiation and chemotherapy. EXAM: CT CHEST, ABDOMEN, AND PELVIS WITH CONTRAST  TECHNIQUE: Multidetector CT imaging of the chest, abdomen and pelvis was performed following the standard protocol during bolus administration of intravenous contrast. CONTRAST:  1 ISOVUE-300 IOPAMIDOL (ISOVUE-300) INJECTION 61% COMPARISON:  Chest CT 09/14/2015.  Abdominal CT 12/30/2015. FINDINGS: Cardiovascular: Aortic atherosclerosis. No evidence of aneurysm. Heart is normal size. Small pericardial effusion. Mediastinum/Nodes: No mediastinal, hilar, or axillary adenopathy. Lungs/Pleura: Chronic opacities and volume loss noted in the right upper lobe extending from the right hilum to the right apex, stable since prior study compatible with postradiation fibrosis. No other confluent airspace opacities. No pleural effusions. No suspicious pulmonary nodule. Upper Abdomen: Prior cholecystectomy. No acute findings in the upper abdomen. Musculoskeletal: Chest wall soft tissues are unremarkable. No acute bony abnormality or focal bone lesion. There is for a gap insert or rib CT ABDOMEN PELVIS FINDINGS Hepatobiliary: Prior cholecystectomy. No focal hepatic abnormality or biliary ductal dilatation. Pancreas: No focal abnormality or ductal dilatation. Spleen: No focal abnormality.  Normal size.  Adrenals/Urinary Tract: No adrenal abnormality. No focal renal abnormality. No stones or hydronephrosis. Urinary bladder is unremarkable. Stomach/Bowel: Sigmoid diverticulosis. Extensive inflammatory changes around the sigmoid colon compatible with active diverticulitis. No focal fluid collection/abscess. Stomach and small bowel decompressed, grossly unremarkable. Vascular/Lymphatic: Scattered aortic and iliac calcifications. No aneurysm. No adenopathy. Reproductive: Prior hysterectomy.  No adnexal masses. Other: No free fluid or free air. Musculoskeletal: No acute bony abnormality or focal bone lesion. 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 No acute cardiopulmonary disease. Sigmoid diverticulosis with changes of active diverticulitis. Small pericardial effusion. Aortic atherosclerosis. Electronically Signed   By: Rolm Baptise M.D.   On: 06/14/2016 15:00   Ct Cervical Spine Wo Contrast  Result Date: 06/14/2016 CLINICAL DATA:  Un- witnessed fall EXAM: CT HEAD WITHOUT CONTRAST CT CERVICAL SPINE WITHOUT CONTRAST TECHNIQUE: Multidetector CT imaging of the head and cervical spine was performed following the standard protocol without intravenous contrast. Multiplanar CT image reconstructions of the cervical spine were also generated. COMPARISON:  03/02/2015 and PET scan 07/26/2010 FINDINGS: CT HEAD FINDINGS No skull fracture is noted. Paranasal sinuses and mastoid air cells are unremarkable. No intracranial hemorrhage, mass effect or midline shift. No acute cortical infarction. No mass lesion is noted on this unenhanced scan. Stable atrophy and chronic white matter disease. Ventricular size is stable from prior exam. CT CERVICAL SPINE FINDINGS Axial images of the cervical spine shows no acute fracture or subluxation. Mild degenerative changes are noted C1-C2 articulation. Computer processed images shows no acute fracture or subluxation. There is disc space flattening with mild  anterior and mild posterior spurring at C4-C5 level. Mild anterior spurring at C5-C6 level. There is anterior spurring at T2-T3 level. No prevertebral soft tissue swelling. Cervical airway is patent. Emphysematous changes are noted in right upper lobe. There is consolidation with bronchiectasis and air bronchogram in right upper lobe medially. Although this may be due to chronic scarring and postradiation changes pneumonia cannot be excluded. Clinical correlation is necessary. Further evaluation with CT scan of the chest could be performed as clinically warranted. IMPRESSION: 1. No acute intracranial abnormality. Stable atrophy and chronic white matter disease. 2. No cervical spine acute fracture or subluxation. Multilevel degenerative changes as described above. 3. Emphysematous changes are noted in right upper lobe. There is consolidation with bronchiectasis and air bronchogram in right upper lobe medially. Although this may be due to chronic scarring and postradiation changes pneumonia cannot be excluded. Clinical correlation is necessary. Further evaluation with CT scan of the chest could be performed  as clinically warranted. Electronically Signed   By: [name] M.D.   On: 06/14/2016 13:11   Ct Abdomen Pelvis W Contrast  Result Date: 06/14/2016 CLINICAL DATA:  Fall out of bed this morning. Right lower quadrant pain. History of right lung cancer, post radiation and chemotherapy. EXAM: CT CHEST, ABDOMEN, AND PELVIS WITH CONTRAST TECHNIQUE: Multidetector CT imaging of the chest, abdomen and pelvis was performed following the standard protocol during bolus administration of intravenous contrast. CONTRAST:  1 ISOVUE-300 IOPAMIDOL (ISOVUE-300) INJECTION 61% COMPARISON:  Chest CT 09/14/2015.  Abdominal CT 12/30/2015. FINDINGS: Cardiovascular: Aortic atherosclerosis. No evidence of aneurysm. Heart is normal size. Small pericardial effusion. Mediastinum/Nodes: No mediastinal, hilar, or axillary adenopathy.  Lungs/Pleura: Chronic opacities and volume loss noted in the right upper lobe extending from the right hilum to the right apex, stable since prior study compatible with postradiation fibrosis. No other confluent airspace opacities. No pleural effusions. No suspicious pulmonary nodule. Upper Abdomen: Prior cholecystectomy. No acute findings in the upper abdomen. Musculoskeletal: Chest wall soft tissues are unremarkable. No acute bony abnormality or focal bone lesion. There is for a gap insert or rib CT ABDOMEN PELVIS FINDINGS Hepatobiliary: Prior cholecystectomy. No focal hepatic abnormality or biliary ductal dilatation. Pancreas: No focal abnormality or ductal dilatation. Spleen: No focal abnormality.  Normal size. Adrenals/Urinary Tract: No adrenal abnormality. No focal renal abnormality. No stones or hydronephrosis. Urinary bladder is unremarkable. Stomach/Bowel: Sigmoid diverticulosis. Extensive inflammatory changes around the sigmoid colon compatible with active diverticulitis. No focal fluid collection/abscess. Stomach and small bowel decompressed, grossly unremarkable. Vascular/Lymphatic: Scattered aortic and iliac calcifications. No aneurysm. No adenopathy. Reproductive: Prior hysterectomy.  No adnexal masses. Other: No free fluid or free air. Musculoskeletal: No acute bony abnormality or focal bone lesion. 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 No acute cardiopulmonary disease. Sigmoid diverticulosis with changes of active diverticulitis. Small pericardial effusion. Aortic atherosclerosis. Electronically Signed   By: Rolm Baptise M.D.   On: 06/14/2016 15:00   Dg Chest Port 1 View  Result Date: 06/14/2016 CLINICAL DATA:  Code sepsis, history of lung cancer EXAM: PORTABLE CHEST 1 VIEW COMPARISON:  CT chest dated 06/14/2016. Chest radiographs dated 09/14/2015. FINDINGS: Mass-like opacity in the medial right upper lobe, unchanged from 2016, corresponding to radiation  fibrosis when correlating with prior CT chest. Lungs are otherwise clear.  No pleural effusion or pneumothorax. The heart is normal in size. 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 No evidence of acute cardiopulmonary disease. Electronically Signed   By: Julian Hy M.D.   On: 06/14/2016 20:18        Scheduled Meds: . aspirin EC  81 mg Oral QHS  . atorvastatin  80 mg Oral q1800  . enoxaparin (LOVENOX) injection  40 mg Subcutaneous Q24H  . insulin aspart  0-9 Units Subcutaneous Q6H  . insulin aspart protamine- aspart  16 Units Subcutaneous Q supper  . insulin aspart protamine- aspart  18 Units Subcutaneous Q breakfast  . ketorolac  1 drop Left Eye QID  . loratadine  10 mg Oral QHS  . pantoprazole  40 mg Oral Daily  . piperacillin-tazobactam (ZOSYN)  IV  3.375 g Intravenous Q8H  . prednisoLONE acetate  1 drop Left Eye QID  . timolol  1 drop Both Eyes q morning - 10a   Continuous Infusions: . sodium chloride 125 mL/hr at 06/15/16 1804     LOS: 1 day    Time spent: 49 minutes    Heleena Miceli, MD  [facility] Pager [phone]  If 7PM-7AM, please contact night-coverage [email or web address] Password [facility] 06/15/2016, 7:12 PM

## 2016-06-16 DIAGNOSIS — K5732 Diverticulitis of large intestine without perforation or abscess without bleeding: Secondary | ICD-10-CM

## 2016-06-16 LAB — BASIC METABOLIC PANEL
ANION GAP: 7 (ref 5–15)
CALCIUM: 8 mg/dL — AB (ref 8.9–10.3)
CO2: 24 mmol/L (ref 22–32)
Chloride: 108 mmol/L (ref 101–111)
Creatinine, Ser: 0.65 mg/dL (ref 0.44–1.00)
GFR calc Af Amer: >60 mL/min (ref >60)
GLUCOSE: 105 mg/dL — AB (ref 65–99)
POTASSIUM: 3.6 mmol/L (ref 3.5–5.1)
Sodium: 139 mmol/L (ref 135–145)

## 2016-06-16 LAB — GLUCOSE, CAPILLARY
GLUCOSE-CAPILLARY: 89 mg/dL (ref 65–99)
GLUCOSE-CAPILLARY: 91 mg/dL (ref 65–99)
GLUCOSE-CAPILLARY: 116 mg/dL — AB (ref 65–99)
GLUCOSE-CAPILLARY: 98 mg/dL (ref 65–99)
GLUCOSE-CAPILLARY: 133 mg/dL — AB (ref 65–99)

## 2016-06-16 LAB — CBC
HEMATOCRIT: 34.1 % — AB (ref 36.0–46.0)
HEMOGLOBIN: 11 g/dL — AB (ref 12.0–15.0)
MCH: 29.4 pg (ref 26.0–34.0)
MCHC: 32.3 g/dL (ref 30.0–36.0)
MCV: 91.2 fL (ref 78.0–100.0)
Platelets: 166 10*3/uL (ref 150–400)
RBC: 3.74 MIL/uL — ABNORMAL LOW (ref 3.87–5.11)
RDW: 15 % (ref 11.5–15.5)
WBC: 10.7 10*3/uL — ABNORMAL HIGH (ref 4.0–10.5)

## 2016-06-16 LAB — MAGNESIUM: Magnesium: 2 mg/dL (ref 1.7–2.4)

## 2016-06-16 LAB — HEMOGLOBIN A1C
HEMOGLOBIN A1C: 7.1 % — AB (ref 4.8–5.6)
Mean Plasma Glucose: 157 mg/dL

## 2016-06-16 MED ORDER — DOCUSATE SODIUM 100 MG PO CAPS
100.0000 mg | ORAL_CAPSULE | Freq: Two times a day (BID) | ORAL | Status: DC
  Administered 2016-06-16 (×2): 100 mg via ORAL
  Filled 2016-06-16 (×3): qty 1

## 2016-06-16 MED ORDER — OXYCODONE-ACETAMINOPHEN 5-325 MG PO TABS
1.0000 | ORAL_TABLET | ORAL | Status: DC | PRN
  Administered 2016-06-17: 1 via ORAL
  Filled 2016-06-16: qty 1

## 2016-06-16 MED ORDER — MORPHINE SULFATE (PF) 2 MG/ML IV SOLN: 1.0000 mg | INTRAVENOUS | Status: DC | PRN

## 2016-06-16 MED ORDER — POTASSIUM CHLORIDE CRYS ER 20 MEQ PO TBCR
40.0000 meq | EXTENDED_RELEASE_TABLET | Freq: Once | ORAL | Status: AC
  Administered 2016-06-16: 40 meq via ORAL
  Filled 2016-06-16: qty 2

## 2016-06-16 NOTE — Consult Note (Signed)
   [facility] CM Inpatient Consult   06/16/2016  [name] June 29, 1946 [id number]  Referral received from inpatient RNCM.  Patient having difficulties managing at home.  Chart review reveals the patient is a 70 y.o. female with a history of diabetes mellitus, non-small cell lung cancer, hyperlipidemia, GERD, CAD presented with 1 day history of abdominal pain. The patient states that she had a mechanical fall getting out of her bed on the evening of 06/13/2016.  Apparently she hit her head but did not have LOC.  When she woke up in the morning of 06/14/2016, her abdominal pain had worsened which prompted her to visit the emergency department.  Met with the patient at the bedside and patient is alert and orieneted x4 but states, "I might forget everything you told me but I need all the help I can get.  Me and my husband manage to get to the doctor but since I've had radiation to my head I am forgetful."  Patient state she will like some post hospital follow up.  Consent form signed.  She endorses Dr. Shawnie Dapper as her primary care provider.   Patient evaluated for community based chronic disease management services with [facility] as a benefit of patient's [employer]. Spoke with patient at bedside to explain [facility] services.  Patient will receive post hospital discharge call and will be evaluated for monthly home visits for assessments and disease process education.  Left contact information and [facility] literature at bedside. Made Inpatient Case Manager aware that [facility] [facility] following. Of note, [facility] Management services does not replace or interfere with any services that are arranged by inpatient case management or social work.  For additional questions or referrals please contact:    Natividad Brood, RN BSN [facility] Liaison  [phone] business mobile phone Toll free office [phone]

## 2016-06-16 NOTE — Progress Notes (Signed)
PROGRESS NOTE    [name]  ZWC:[id number] DOB: December 21, 1945 DOA: 06/14/2016 PCP: [name], MD    Brief Narrative:  [name] is a 70 y.o. female with medical history of 70 year old female with a history of diabetes mellitus, non-small cell lung cancer, hyperlipidemia, GERD, CAD presented with 1 day history of abdominal pain. The patient states that she had a mechanical fall getting out of her bed on the evening of 06/13/2016.  Apparently she hit her head but did not have LOC.  When she woke up in the morning of 06/14/2016, her abdominal pain had worsened which prompted her to visit the emergency department. Patient had been in her usual state of health without any complaints. She denied any  fevers, chills, headache, chest pain, dyspnea,  dysuria, hematuria, hematochezia, and melena.  The patient had one episode of nausea and vomiting on 06/13/2016, but none since then. She has had 3 loose stools without hematochezia or melena, but none on the day of admission.  Workup in the emergency department included a CT of the abdomen and pelvis revealed acute diverticulitis of the sigmoid colon. 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 CT of the head and CT of the cervical spine were unremarkable. BMP and LFTs were unremarkable. WBC was 19.0, lactic acid 1.80.  The patient was given 1.5 L of normal saline and started on Zosyn.   Assessment & Plan:   Principal Problem:   Diverticulitis of sigmoid colon Active Problems:   Sepsis ([place])   GERD   Acute diverticulitis   Controlled type 2 diabetes mellitus with hyperglycemia, with long-term current use of insulin ([place])   Hyperlipidemia   Arterial hypotension  #1 acute sigmoid diverticulitis Patient 70 year old female presented to the ED with 1 day history of abdominal pain. CT abdomen and pelvis consistent with acute diverticulitis of the sigmoid colon. Patient noted to have a leukocytosis with a white  count of 19 and a lactic acid level I.80. Patient also noted to have borderline blood pressure. Patient does state abdominal pain improved from admission however patient still with significant diffuse abdominal pain. Leukocytosis trending down with a white count of 10.7 from 13.9. Lactic acid level at 1.3. Pro-calcitonin of 0.32. Will place on a clear liquid diet. Continue empiric IV Zosyn. Follow.  #2 sepsis Sepsis secondary to acute diverticulitis. Patient met criteria for sepsis on admission with tachycardia, leukocytosis and borderline blood pressure/hypotension. Patient currently afebrile. Leukocytosis trending down. Lactic acid level trending down. Patient has been pancultured results pending. Continue IV Zosyn. IV fluids.  #3 hypotension/borderline blood pressure Secondary to problem #1 and 2. Patient has been pancultured. Continue IV fluids. Continue empiric IV antibiotics.  #4 diabetes mellitus type 2 Hemoglobin A1c was 7.50 03/03/2016. Hold oral hypoglycemic agents. Continue half home dose 70/30. Sliding scale insulin.  #5 coronary artery disease Stable. Patient currently asymptomatic. Continue aspirin and statin.  #6 hyperlipidemia Continue statin.  #7 COPD Stable. Continue PPI, Claritin, Flovent, albuterol nebs as needed.   DVT prophylaxis: Lovenox Code Status: Full Family Communication: Updated patient. No family at bedside. Disposition Plan: Home when medically stable and tolerating a solid diet with no further abdominal pain or significant improvement with abdominal pain.   Consultants:   None  Procedures:   CT abdomen and pelvis 06/14/2016  CT chest 06/14/2016  CT head 06/14/2016  Antimicrobials:   IV Zosyn 06/14/2016   Subjective: Patient asking for food. Patient states some improvement with abdominal  pain. No nausea. No vomiting. No shortness of breath. No chest pain.   Objective: Vitals:   06/15/16 1534 06/15/16 2204 06/15/16 2349 06/16/16 0627    BP: (!) 96/55 (!) 92/59 (!) 104/54 (!) 128/51  Pulse: (!) 108 93 89 94  Resp:  19  20  Temp: 98.4 F (36.9 C) 98.1 F (36.7 C)  97.9 F (36.6 C)  TempSrc:  Oral  Oral  SpO2: 98% 98%  96%  Weight:      Height:        Intake/Output Summary (Last 24 hours) at 06/16/16 1358 Last data filed at 06/16/16 0653  Gross per 24 hour  Intake          3342.92 ml  Output             1050 ml  Net          2292.92 ml   Filed Weights   06/14/16 0941  Weight: 81.2 kg (179 lb)    Examination:  General exam: Appears calm and comfortable  Respiratory system: Clear to auscultation. Respiratory effort normal. Cardiovascular system: S1 & S2 heard, RRR. No JVD, murmurs, rubs, gallops or clicks. No pedal edema. Gastrointestinal system: Abdomen is nondistended, soft and diffusely tender to palpation. Positive bowel sounds.  Central nervous system: Alert and oriented. No focal neurological deficits. Extremities: Symmetric 5 x 5 power. Skin: No rashes, lesions or ulcers Psychiatry: Judgement and insight appear normal. Mood & affect appropriate.     Data Reviewed: I have personally reviewed following labs and imaging studies  CBC:  Recent Labs Lab 06/14/16 1158 06/15/16 0526 06/16/16 0621  WBC 19.0* 13.9* 10.7*  NEUTROABS 16.9*  --   --   HGB 14.8 11.4* 11.0*  HCT 46.0 36.2 34.1*  MCV 92.6 91.4 91.2  PLT 218 188 063   Basic Metabolic Panel:  Recent Labs Lab 06/14/16 1158 06/15/16 0526 06/16/16 0621  NA 138 137 139  K 4.3 3.4* 3.6  CL 104 105 108  CO2 26 23 24   GLUCOSE 133* 104* 105*  BUN 9 6 <5*  CREATININE 0.80 0.64 0.65  CALCIUM 9.3 8.1* 8.0*  MG  --   --  2.0   GFR: Estimated Creatinine Clearance: 67.3 mL/min (by C-G formula based on SCr of 0.8 mg/dL). Liver Function Tests:  Recent Labs Lab 06/14/16 1158  AST 22  ALT 19  ALKPHOS 105  BILITOT 0.9  PROT 7.8  ALBUMIN 4.0    Recent Labs Lab 06/14/16 1158  LIPASE 23   No results for input(s): AMMONIA in the  last 168 hours. Coagulation Profile:  Recent Labs Lab 06/14/16 2032  INR 1.20   Cardiac Enzymes: No results for input(s): CKTOTAL, CKMB, CKMBINDEX, TROPONINI in the last 168 hours. BNP (last 3 results) No results for input(s): PROBNP in the last 8760 hours. HbA1C:  Recent Labs  06/14/16 2032  HGBA1C 7.1*   CBG:  Recent Labs Lab 06/15/16 2208 06/15/16 2345 06/16/16 0415 06/16/16 0751 06/16/16 1158  GLUCAP 59* 145* 89 91 116*   Lipid Profile: No results for input(s): CHOL, HDL, LDLCALC, TRIG, CHOLHDL, LDLDIRECT in the last 72 hours. Thyroid Function Tests: No results for input(s): TSH, T4TOTAL, FREET4, T3FREE, THYROIDAB in the last 72 hours. Anemia Panel: No results for input(s): VITAMINB12, FOLATE, FERRITIN, TIBC, IRON, RETICCTPCT in the last 72 hours. Sepsis Labs:  Recent Labs Lab 06/14/16 1220 06/14/16 1652 06/14/16 2032 06/15/16 0014  PROCALCITON  --   --  0.32  --  LATICACIDVEN 1.86 1.85 1.3 1.3    Recent Results (from the past 240 hour(s))  Urine culture     Status: None   Collection Time: 06/14/16 12:49 PM  Result Value Ref Range Status   Specimen Description URINE, CLEAN CATCH  Final   Special Requests NONE  Final   Culture NO GROWTH  Final   Report Status 06/15/2016 FINAL  Final  Culture, blood (routine x 2)     Status: None (Preliminary result)   Collection Time: 06/14/16  4:00 PM  Result Value Ref Range Status   Specimen Description BLOOD LEFT ANTECUBITAL  Final   Special Requests BOTTLES DRAWN AEROBIC AND ANAEROBIC 5CC  Final   Culture NO GROWTH < 24 HOURS  Final   Report Status PENDING  Incomplete  Culture, blood (routine x 2)     Status: None (Preliminary result)   Collection Time: 06/14/16  4:04 PM  Result Value Ref Range Status   Specimen Description BLOOD RIGHT ANTECUBITAL  Final   Special Requests BOTTLES DRAWN AEROBIC ONLY 5CC  Final   Culture NO GROWTH < 24 HOURS  Final   Report Status PENDING  Incomplete         Radiology  Studies: Ct Chest W Contrast  Result Date: 06/14/2016 CLINICAL DATA:  Fall out of bed this morning. Right lower quadrant pain. History of right lung cancer, post radiation and chemotherapy. EXAM: CT CHEST, ABDOMEN, AND PELVIS WITH CONTRAST TECHNIQUE: Multidetector CT imaging of the chest, abdomen and pelvis was performed following the standard protocol during bolus administration of intravenous contrast. CONTRAST:  1 ISOVUE-300 IOPAMIDOL (ISOVUE-300) INJECTION 61% COMPARISON:  Chest CT 09/14/2015.  Abdominal CT 12/30/2015. FINDINGS: Cardiovascular: Aortic atherosclerosis. No evidence of aneurysm. Heart is normal size. Small pericardial effusion. Mediastinum/Nodes: No mediastinal, hilar, or axillary adenopathy. Lungs/Pleura: Chronic opacities and volume loss noted in the right upper lobe extending from the right hilum to the right apex, stable since prior study compatible with postradiation fibrosis. No other confluent airspace opacities. No pleural effusions. No suspicious pulmonary nodule. Upper Abdomen: Prior cholecystectomy. No acute findings in the upper abdomen. Musculoskeletal: Chest wall soft tissues are unremarkable. No acute bony abnormality or focal bone lesion. There is for a gap insert or rib CT ABDOMEN PELVIS FINDINGS Hepatobiliary: Prior cholecystectomy. No focal hepatic abnormality or biliary ductal dilatation. Pancreas: No focal abnormality or ductal dilatation. Spleen: No focal abnormality.  Normal size. Adrenals/Urinary Tract: No adrenal abnormality. No focal renal abnormality. No stones or hydronephrosis. Urinary bladder is unremarkable. Stomach/Bowel: Sigmoid diverticulosis. Extensive inflammatory changes around the sigmoid colon compatible with active diverticulitis. No focal fluid collection/abscess. Stomach and small bowel decompressed, grossly unremarkable. Vascular/Lymphatic: Scattered aortic and iliac calcifications. No aneurysm. No adenopathy. Reproductive: Prior hysterectomy.  No  adnexal masses. Other: No free fluid or free air. Musculoskeletal: No acute bony abnormality or focal bone lesion. 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 No acute cardiopulmonary disease. Sigmoid diverticulosis with changes of active diverticulitis. Small pericardial effusion. Aortic atherosclerosis. Electronically Signed   By: Rolm Baptise M.D.   On: 06/14/2016 15:00   Ct Abdomen Pelvis W Contrast  Result Date: 06/14/2016 CLINICAL DATA:  Fall out of bed this morning. Right lower quadrant pain. History of right lung cancer, post radiation and chemotherapy. EXAM: CT CHEST, ABDOMEN, AND PELVIS WITH CONTRAST TECHNIQUE: Multidetector CT imaging of the chest, abdomen and pelvis was performed following the standard protocol during bolus administration of intravenous contrast. CONTRAST:  1 ISOVUE-300 IOPAMIDOL (ISOVUE-300) INJECTION 61%  COMPARISON:  Chest CT 09/14/2015.  Abdominal CT 12/30/2015. FINDINGS: Cardiovascular: Aortic atherosclerosis. No evidence of aneurysm. Heart is normal size. Small pericardial effusion. Mediastinum/Nodes: No mediastinal, hilar, or axillary adenopathy. Lungs/Pleura: Chronic opacities and volume loss noted in the right upper lobe extending from the right hilum to the right apex, stable since prior study compatible with postradiation fibrosis. No other confluent airspace opacities. No pleural effusions. No suspicious pulmonary nodule. Upper Abdomen: Prior cholecystectomy. No acute findings in the upper abdomen. Musculoskeletal: Chest wall soft tissues are unremarkable. No acute bony abnormality or focal bone lesion. There is for a gap insert or rib CT ABDOMEN PELVIS FINDINGS Hepatobiliary: Prior cholecystectomy. No focal hepatic abnormality or biliary ductal dilatation. Pancreas: No focal abnormality or ductal dilatation. Spleen: No focal abnormality.  Normal size. Adrenals/Urinary Tract: No adrenal abnormality. No focal renal abnormality. No stones or  hydronephrosis. Urinary bladder is unremarkable. Stomach/Bowel: Sigmoid diverticulosis. Extensive inflammatory changes around the sigmoid colon compatible with active diverticulitis. No focal fluid collection/abscess. Stomach and small bowel decompressed, grossly unremarkable. Vascular/Lymphatic: Scattered aortic and iliac calcifications. No aneurysm. No adenopathy. Reproductive: Prior hysterectomy.  No adnexal masses. Other: No free fluid or free air. Musculoskeletal: No acute bony abnormality or focal bone lesion. 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 No acute cardiopulmonary disease. Sigmoid diverticulosis with changes of active diverticulitis. Small pericardial effusion. Aortic atherosclerosis. Electronically Signed   By: Rolm Baptise M.D.   On: 06/14/2016 15:00   Dg Chest Port 1 View  Result Date: 06/14/2016 CLINICAL DATA:  Code sepsis, history of lung cancer EXAM: PORTABLE CHEST 1 VIEW COMPARISON:  CT chest dated 06/14/2016. Chest radiographs dated 09/14/2015. FINDINGS: Mass-like opacity in the medial right upper lobe, unchanged from 2016, corresponding to radiation fibrosis when correlating with prior CT chest. Lungs are otherwise clear.  No pleural effusion or pneumothorax. The heart is normal in size. 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 No evidence of acute cardiopulmonary disease. Electronically Signed   By: Julian Hy M.D.   On: 06/14/2016 20:18        Scheduled Meds: . aspirin EC  81 mg Oral QHS  . atorvastatin  80 mg Oral q1800  . docusate sodium  100 mg Oral BID  . enoxaparin (LOVENOX) injection  40 mg Subcutaneous Q24H  . insulin aspart  0-9 Units Subcutaneous Q4H  . insulin aspart protamine- aspart  12 Units Subcutaneous Q supper  . insulin aspart protamine- aspart  18 Units Subcutaneous Q breakfast  . ketorolac  1 drop Left Eye QID  . loratadine  10 mg Oral QHS  . pantoprazole  40 mg Oral Daily  .  piperacillin-tazobactam (ZOSYN)  IV  3.375 g Intravenous Q8H  . prednisoLONE acetate  1 drop Left Eye QID  . timolol  1 drop Both Eyes q morning - 10a   Continuous Infusions: . sodium chloride 100 mL/hr at 06/16/16 1317     LOS: 2 days    Time spent: 60 minutes    Bettylee Feig, MD [facility] Pager [phone]  If 7PM-7AM, please contact night-coverage [email or web address] Password [email or web address] 06/16/2016, 1:58 PM

## 2016-06-16 NOTE — Progress Notes (Signed)
Inpatient Diabetes Program Recommendations  AACE/ADA: New Consensus Statement on Inpatient Glycemic Control (2015)  Target Ranges:  Prepandial:   less than 140 mg/dL      Peak postprandial:   less than 180 mg/dL (1-2 hours)      Critically ill patients:  140 - 180 mg/dL   Results for [name], [name] (MRN [id number]) as of 06/16/2016 09:40  Ref. Range 06/15/2016 08:02 06/15/2016 12:19 06/15/2016 17:53 06/15/2016 22:08 06/15/2016 23:45  Glucose-Capillary Latest Ref Range: 65 - 99 mg/dL 116 (H) 177 (H) 124 (H) 59 (L) 145 (H)   Results for [name], [name] (MRN [id number]) as of 06/16/2016 09:40  Ref. Range 06/16/2016 07:51  Glucose-Capillary Latest Ref Range: 65 - 99 mg/dL 38    Admit with: Abd Pain/ Sepsis/ Diverticulitis  History: DM  Home DM Meds: 70/30 Insulin: 38 units with breakfast/ 34 units with supper       Metformin 500 mg bid  Current Insulin Orders: 70/30 Insulin: 18 units with breakfast/ 12 units with supper      Novolog Sensitive Correction Scale/ SSI (0-9 units) Q4 hours      MD- Note patient currently NPO.  Had mild Hypoglycemic event last night at bedtime after getting 16 units 70/30 last night at supper.  Note evening dose of 70/30 insulin reduced to 12 units as a result.  If patient will start PO diet today, can leave patient on 70/30 insulin.  However, if patient to remain NPO, may want to stop 70/30 insulin and start Lantus instead (Lantus 20 units daily 0.25 units/kg dosing)      --Will follow patient during hospitalization--  Wyn Quaker RN, MSN, CDE Diabetes Coordinator Inpatient Glycemic Control Team Team Pager: [phone] (8a-5p)

## 2016-06-17 DIAGNOSIS — K219 Gastro-esophageal reflux disease without esophagitis: Secondary | ICD-10-CM

## 2016-06-17 LAB — C DIFFICILE QUICK SCREEN W PCR REFLEX
C DIFFICILE (CDIFF) INTERP: NOT DETECTED
C DIFFICILE (CDIFF) TOXIN: NEGATIVE
C DIFFICLE (CDIFF) ANTIGEN: NEGATIVE

## 2016-06-17 LAB — GLUCOSE, CAPILLARY
GLUCOSE-CAPILLARY: 93 mg/dL (ref 65–99)
GLUCOSE-CAPILLARY: 127 mg/dL — AB (ref 65–99)
GLUCOSE-CAPILLARY: 156 mg/dL — AB (ref 65–99)
Glucose-Capillary: 100 mg/dL — ABNORMAL HIGH (ref 65–99)
Glucose-Capillary: 102 mg/dL — ABNORMAL HIGH (ref 65–99)
Glucose-Capillary: 106 mg/dL — ABNORMAL HIGH (ref 65–99)
Glucose-Capillary: 254 mg/dL — ABNORMAL HIGH (ref 65–99)
Glucose-Capillary: 92 mg/dL (ref 65–99)

## 2016-06-17 LAB — CBC
HEMATOCRIT: 33.8 % — AB (ref 36.0–46.0)
HEMOGLOBIN: 10.8 g/dL — AB (ref 12.0–15.0)
MCH: 29.2 pg (ref 26.0–34.0)
MCHC: 32 g/dL (ref 30.0–36.0)
MCV: 91.4 fL (ref 78.0–100.0)
Platelets: 183 10*3/uL (ref 150–400)
RBC: 3.7 MIL/uL — ABNORMAL LOW (ref 3.87–5.11)
RDW: 14.8 % (ref 11.5–15.5)
WBC: 7.5 10*3/uL (ref 4.0–10.5)

## 2016-06-17 LAB — BASIC METABOLIC PANEL
Anion gap: 8 (ref 5–15)
CHLORIDE: 108 mmol/L (ref 101–111)
CO2: 23 mmol/L (ref 22–32)
CREATININE: 0.56 mg/dL (ref 0.44–1.00)
Calcium: 8.3 mg/dL — ABNORMAL LOW (ref 8.9–10.3)
GFR calc Af Amer: >60 mL/min (ref >60)
GFR calc non Af Amer: >60 mL/min (ref >60)
GLUCOSE: 93 mg/dL (ref 65–99)
POTASSIUM: 3.4 mmol/L — AB (ref 3.5–5.1)
SODIUM: 139 mmol/L (ref 135–145)

## 2016-06-17 LAB — MAGNESIUM: MAGNESIUM: 2 mg/dL (ref 1.7–2.4)

## 2016-06-17 MED ORDER — SODIUM CHLORIDE 0.9 % IV BOLUS (SEPSIS)
1000.0000 mL | Freq: Once | INTRAVENOUS | Status: AC
  Administered 2016-06-17: 1000 mL via INTRAVENOUS

## 2016-06-17 MED ORDER — SACCHAROMYCES BOULARDII 250 MG PO CAPS
250.0000 mg | ORAL_CAPSULE | Freq: Two times a day (BID) | ORAL | Status: DC
  Administered 2016-06-17 – 2016-06-20 (×7): 250 mg via ORAL
  Filled 2016-06-17 (×8): qty 1

## 2016-06-17 MED ORDER — CHOLESTYRAMINE 4 G PO PACK
4.0000 g | PACK | Freq: Two times a day (BID) | ORAL | Status: DC
  Administered 2016-06-17 – 2016-06-18 (×2): 4 g via ORAL
  Filled 2016-06-17 (×3): qty 1

## 2016-06-17 MED ORDER — POTASSIUM CHLORIDE CRYS ER 10 MEQ PO TBCR
EXTENDED_RELEASE_TABLET | ORAL | Status: AC
  Administered 2016-06-17: 40 meq
  Filled 2016-06-17: qty 4

## 2016-06-17 MED ORDER — POTASSIUM CHLORIDE CRYS ER 20 MEQ PO TBCR
40.0000 meq | EXTENDED_RELEASE_TABLET | Freq: Once | ORAL | Status: AC
  Administered 2016-06-17: 40 meq via ORAL
  Filled 2016-06-17: qty 2

## 2016-06-17 MED ORDER — FAMOTIDINE 20 MG PO TABS
20.0000 mg | ORAL_TABLET | Freq: Two times a day (BID) | ORAL | Status: DC
  Administered 2016-06-17 – 2016-06-20 (×6): 20 mg via ORAL
  Filled 2016-06-17 (×6): qty 1

## 2016-06-17 NOTE — Care Management Important Message (Signed)
Important Message  Patient Details  Name: [name] MRN: [id number] Date of Birth: 14-Oct-1946   Medicare Important Message Given:  Yes    Veena, [place] 06/17/2016, 8:59 AM

## 2016-06-17 NOTE — Progress Notes (Signed)
PROGRESS NOTE    [name]  FGH:[id number] DOB: Sep 18, 1946 DOA: 06/14/2016 PCP: [name], MD    Brief Narrative:  [name] is a 70 y.o. female with medical history of 70 year old female with a history of diabetes mellitus, non-small cell lung cancer, hyperlipidemia, GERD, CAD presented with 1 day history of abdominal pain. The patient states that she had a mechanical fall getting out of her bed on the evening of 06/13/2016.  Apparently she hit her head but did not have LOC.  When she woke up in the morning of 06/14/2016, her abdominal pain had worsened which prompted her to visit the emergency department. Patient had been in her usual state of health without any complaints. She denied any  fevers, chills, headache, chest pain, dyspnea,  dysuria, hematuria, hematochezia, and melena.  The patient had one episode of nausea and vomiting on 06/13/2016, but none since then. She has had 3 loose stools without hematochezia or melena, but none on the day of admission.  Workup in the emergency department included a CT of the abdomen and pelvis revealed acute diverticulitis of the sigmoid colon. 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 CT of the head and CT of the cervical spine were unremarkable. BMP and LFTs were unremarkable. WBC was 19.0, lactic acid 1.80.  The patient was given 1.5 L of normal saline and started on Zosyn.   Assessment & Plan:   Principal Problem:   Diverticulitis of sigmoid colon Active Problems:   Sepsis ([place])   GERD   Acute diverticulitis   Controlled type 2 diabetes mellitus with hyperglycemia, with long-term current use of insulin ([place])   Hyperlipidemia   Arterial hypotension  #1 acute sigmoid diverticulitis Patient 70 year old female presented to the ED with 1 day history of abdominal pain. CT abdomen and pelvis consistent with acute diverticulitis of the sigmoid colon. Patient noted to have a leukocytosis with a white  count of 19 and a lactic acid level I.80. Patient also noted to have borderline blood pressure. Patient does state abdominal pain improved from admission. Leukocytosis trending down with a white count of 7.5 from 10.7 from 13.9. Lactic acid level at 1.3. Pro-calcitonin of 0.32. Patient tolerating clear liquids. Advance to full liquid diet. Place on a probiotic. Continue empiric IV Zosyn. Follow.  #2 sepsis Sepsis secondary to acute diverticulitis. Patient met criteria for sepsis on admission with tachycardia, leukocytosis and borderline blood pressure/hypotension. Patient currently afebrile. Leukocytosis trending down. Lactic acid level trending down. Patient has been pancultured results pending. Patient complaining of watery loose stools and a such will check a C. difficile PCR. Continue IV Zosyn. IV fluids. Place on the probiotics.  #3 hypotension/borderline blood pressure Secondary to problem #1 and 2. Patient has been pancultured. Continue IV fluids. Will give a bolus of normal saline 1 L IV 1. Continue empiric IV antibiotics.  #4 diabetes mellitus type 2 Hemoglobin A1c was 7.50 03/03/2016. Hold oral hypoglycemic agents. Continue half home dose 70/30. Sliding scale insulin.  #5 coronary artery disease Stable. Patient currently asymptomatic. Continue aspirin and statin.  #6 hyperlipidemia Continue statin.  #7 COPD Stable. Continue PPI, Claritin, Flovent, albuterol nebs as needed.  #8 diarrhea Patient complaining of multiple loose watery stools. Will check a C. difficile PCR. Place on probiotic. C. difficile PCR is negative will place on Questran daily versus Imodium as needed.   DVT prophylaxis: Lovenox Code Status: Full Family Communication: Updated patient. No family at bedside. Disposition Plan:  Home when medically stable and tolerating a solid diet with no further abdominal pain or significant improvement with abdominal pain.   Consultants:   None  Procedures:   CT abdomen  and pelvis 06/14/2016  CT chest 06/14/2016  CT head 06/14/2016  Antimicrobials:   IV Zosyn 06/14/2016   Subjective: Patient sitting up in chair. Patient states has had multiple runny loose watery stools. Patient denies any nausea. No vomiting. No shortness of breath. No chest pain. Patient states improvement with abdominal pain. Patient tolerating clear liquids.   Objective: Vitals:   06/16/16 1531 06/16/16 2252 06/17/16 0629 06/17/16 1346  BP: (!) 115/41 127/67 (!) 106/49 (!) 90/19  Pulse: 79 91 86 88  Resp: 18 18 20 20   Temp: 98.2 F (36.8 C) 98 F (36.7 C) 98.6 F (37 C) 97.7 F (36.5 C)  TempSrc: Oral  Oral Oral  SpO2: 99% 100% 93% 98%  Weight:      Height:        Intake/Output Summary (Last 24 hours) at 06/17/16 2016 Last data filed at 06/17/16 1800  Gross per 24 hour  Intake             2129 ml  Output             1700 ml  Net              429 ml   Filed Weights   06/14/16 0941  Weight: 81.2 kg (179 lb)    Examination:  General exam: Appears calm and comfortable  Respiratory system: Clear to auscultation. Respiratory effort normal. Cardiovascular system: S1 & S2 heard, RRR. No JVD, murmurs, rubs, gallops or clicks. No pedal edema. Gastrointestinal system: Abdomen is nondistended, soft and Significantly less tender to palpation. Positive bowel sounds.  Central nervous system: Alert and oriented. No focal neurological deficits. Extremities: Symmetric 5 x 5 power. Skin: No rashes, lesions or ulcers Psychiatry: Judgement and insight appear normal. Mood & affect appropriate.     Data Reviewed: I have personally reviewed following labs and imaging studies  CBC:  Recent Labs Lab 06/14/16 1158 06/15/16 0526 06/16/16 0621 06/17/16 0519  WBC 19.0* 13.9* 10.7* 7.5  NEUTROABS 16.9*  --   --   --   HGB 14.8 11.4* 11.0* 10.8*  HCT 46.0 36.2 34.1* 33.8*  MCV 92.6 91.4 91.2 91.4  PLT 218 188 166 408   Basic Metabolic Panel:  Recent Labs Lab  06/14/16 1158 06/15/16 0526 06/16/16 0621 06/17/16 0519  NA 138 137 139 139  K 4.3 3.4* 3.6 3.4*  CL 104 105 108 108  CO2 26 23 24 23   GLUCOSE 133* 104* 105* 93  BUN 9 6 <5* <5*  CREATININE 0.80 0.64 0.65 0.56  CALCIUM 9.3 8.1* 8.0* 8.3*  MG  --   --  2.0 2.0   GFR: Estimated Creatinine Clearance: 67.3 mL/min (by C-G formula based on SCr of 0.8 mg/dL). Liver Function Tests:  Recent Labs Lab 06/14/16 1158  AST 22  ALT 19  ALKPHOS 105  BILITOT 0.9  PROT 7.8  ALBUMIN 4.0    Recent Labs Lab 06/14/16 1158  LIPASE 23   No results for input(s): AMMONIA in the last 168 hours. Coagulation Profile:  Recent Labs Lab 06/14/16 2032  INR 1.20   Cardiac Enzymes: No results for input(s): CKTOTAL, CKMB, CKMBINDEX, TROPONINI in the last 168 hours. BNP (last 3 results) No results for input(s): PROBNP in the last 8760 hours. HbA1C:  Recent Labs  06/14/16  2032  HGBA1C 7.1*   CBG:  Recent Labs Lab 06/17/16 0009 06/17/16 0418 06/17/16 0737 06/17/16 1151 06/17/16 1701  GLUCAP 92 93 106* 254* 127*   Lipid Profile: No results for input(s): CHOL, HDL, LDLCALC, TRIG, CHOLHDL, LDLDIRECT in the last 72 hours. Thyroid Function Tests: No results for input(s): TSH, T4TOTAL, FREET4, T3FREE, THYROIDAB in the last 72 hours. Anemia Panel: No results for input(s): VITAMINB12, FOLATE, FERRITIN, TIBC, IRON, RETICCTPCT in the last 72 hours. Sepsis Labs:  Recent Labs Lab 06/14/16 1220 06/14/16 1652 06/14/16 2032 06/15/16 0014  PROCALCITON  --   --  0.32  --   LATICACIDVEN 1.86 1.85 1.3 1.3    Recent Results (from the past 240 hour(s))  Urine culture     Status: None   Collection Time: 06/14/16 12:49 PM  Result Value Ref Range Status   Specimen Description URINE, CLEAN CATCH  Final   Special Requests NONE  Final   Culture NO GROWTH  Final   Report Status 06/15/2016 FINAL  Final  Culture, blood (routine x 2)     Status: None (Preliminary result)   Collection Time:  06/14/16  4:00 PM  Result Value Ref Range Status   Specimen Description BLOOD LEFT ANTECUBITAL  Final   Special Requests BOTTLES DRAWN AEROBIC AND ANAEROBIC 5CC  Final   Culture NO GROWTH 3 DAYS  Final   Report Status PENDING  Incomplete  Culture, blood (routine x 2)     Status: None (Preliminary result)   Collection Time: 06/14/16  4:04 PM  Result Value Ref Range Status   Specimen Description BLOOD RIGHT ANTECUBITAL  Final   Special Requests BOTTLES DRAWN AEROBIC ONLY 5CC  Final   Culture NO GROWTH 3 DAYS  Final   Report Status PENDING  Incomplete         Radiology Studies: No results found.      Scheduled Meds: . aspirin EC  81 mg Oral QHS  . atorvastatin  80 mg Oral q1800  . enoxaparin (LOVENOX) injection  40 mg Subcutaneous Q24H  . insulin aspart  0-9 Units Subcutaneous Q4H  . insulin aspart protamine- aspart  12 Units Subcutaneous Q supper  . insulin aspart protamine- aspart  18 Units Subcutaneous Q breakfast  . ketorolac  1 drop Left Eye QID  . loratadine  10 mg Oral QHS  . pantoprazole  40 mg Oral Daily  . piperacillin-tazobactam (ZOSYN)  IV  3.375 g Intravenous Q8H  . prednisoLONE acetate  1 drop Left Eye QID  . saccharomyces boulardii  250 mg Oral BID  . sodium chloride  1,000 mL Intravenous Once  . timolol  1 drop Both Eyes q morning - 10a   Continuous Infusions: . sodium chloride 100 mL/hr at 06/17/16 1120     LOS: 3 days    Time spent: 63 minutes    THOMPSON,DANIEL, MD [facility] Pager [phone]  If 7PM-7AM, please contact night-coverage [email or web address] Password [email or web address] 06/17/2016, 8:16 PM

## 2016-06-17 NOTE — Progress Notes (Signed)
Occupational Therapy Treatment Patient Details Name: [name] MRN: [id number] DOB: Mar 11, 1946 Today's Date: 06/17/2016    History of present illness [name] is a 70 y.o. female with medical history of 70 year old female with a history of diabetes mellitus, non-small cell lung cancer, hyperlipidemia, GERD, CAD presented with 1 day history of abdominal pain. The patient states that she had a mechanical fall getting out of her bed on the evening of 06/13/2016.  Apparently she hit her head but did not have LOC.  When she woke up in the morning of 06/14/2016, her abdominal pain had worsened which prompted her to visit the emergency department.   OT comments  Pt participated in ADL retraining session today for functional mobility/transfers using RW, grooming standing at sink, toilet transfer and energy conservation techniques. Pt should benefit from handout on energy conservation next 1-2 visits.   Follow Up Recommendations  Home health OT;Supervision - Intermittent    Equipment Recommendations  3 in 1 bedside comode    Recommendations for Other Services      Precautions / Restrictions Precautions Precautions: Fall Precaution Comments: abdominal pain Restrictions Weight Bearing Restrictions: No       Mobility Bed Mobility Overal bed mobility:  (Pt was up in chair upon OT arrival)                Transfers Overall transfer level: Needs assistance Equipment used: Rolling walker (2 wheeled) Transfers: Sit to/from [facility] Sit to Stand: Min guard Stand pivot transfers: Min guard       General transfer comment: assist to rise and for balance; VC's for safety & RW use    Balance Overall balance assessment: Needs assistance Sitting-balance support: Feet supported;No upper extremity supported Sitting balance-Leahy Scale: Fair Sitting balance - Comments: In chair   Standing balance support: Bilateral upper extremity supported Standing  balance-Leahy Scale: Fair Standing balance comment: Pt requires RW use to maintain balance; she fatigues easily and was noted to lean on bilateral elbows on counter & RW arm rests at times. Deconditioned                   ADL Overall ADL's : Needs assistance/impaired Eating/Feeding: Independent;Sitting Eating/Feeding Details (indicate cue type and reason): pt unhappy on clear liquids Grooming: Wash/dry hands;Wash/dry face;Standing;Min guard (Pt fatigues during hand/face washing and leaning on elbows on counter / rest break) Grooming Details (indicate cue type and reason): Sitting; set up to brush hair             Lower Body Dressing: Supervision/safety;Set up;Sitting/lateral leans Lower Body Dressing Details (indicate cue type and reason): Sitting to don socks prior to functional mobility today. Toilet Transfer: Min guard;Grab bars;RW;Ambulation;Cueing for Office manager Details (indicate cue type and reason): VC's for safety with RW. Pt required rest breaks x2 during toileting. VC's for pursed lip breathing Toileting- Clothing Manipulation and Hygiene: Min guard;Sitting/lateral lean;Sit to/from stand         General ADL Comments: Pt participated in ADL retraining session today for LB dressing, functional mobility/transfers and grooming standing at sink. Pt was educated in pursed lip breathing, rest breaks, energy conservation techniques verbally discussed.       Vision                     Perception     Praxis      Cognition   Behavior During Therapy: [facility] for tasks assessed/performed Overall Cognitive Status: Within Functional Limits for tasks assessed  Extremity/Trunk Assessment               Exercises     Shoulder Instructions       General Comments      Pertinent Vitals/ Pain       Pain Assessment: 0-10 Pain Score: 10-Worst pain ever Faces Pain Scale: Hurts worst Pain Location: Abdomen Pain  Descriptors / Indicators: Discomfort Pain Intervention(s): Limited activity within patient's tolerance;Monitored during session;RN gave pain meds during session;Repositioned  Home Living                                          Prior Functioning/Environment              Frequency Min 2X/week     Progress Toward Goals  OT Goals(current goals can now be found in the care plan section)  Progress towards OT goals: Progressing toward goals  Acute Rehab OT Goals Patient Stated Goal: home asap  Plan Discharge plan remains appropriate    Co-evaluation                 End of Session Equipment Utilized During Treatment: Gait belt;Rolling walker   Activity Tolerance Patient limited by fatigue   Patient Left in chair;with call bell/phone within reach;with chair alarm set   Nurse Communication Other (comment) (Pt requests coffee)        Time: 9381-8299 OT Time Calculation (min): 37 min  Charges: OT General Charges $OT Visit: 1 Procedure OT Treatments $Self Care/Home Management : 23-37 mins  Molina Hollenback Beth Dixon, OTR/L 06/17/2016, 10:48 AM

## 2016-06-17 NOTE — Progress Notes (Signed)
Physical Therapy Treatment Patient Details Name: [name] MRN: [id number] DOB: 05/28/1946 Today's Date: 06/17/2016    History of Present Illness [name] is a 70 y.o. female with medical history of 70 year old female with a history of diabetes mellitus, non-small cell lung cancer, hyperlipidemia, GERD, CAD presented with 1 day history of abdominal pain. The patient states that she had a mechanical fall getting out of her bed on the evening of 06/13/2016.  Apparently she hit her head but did not have LOC.  When she woke up in the morning of 06/14/2016, her abdominal pain had worsened which prompted her to visit the emergency department.    PT Comments    Patient with improved activity tolerance this session and with no c/o pain. SOB with ambulation however SpO2 97%. Pt instructed to continue using walker upon d/c home for energy conservation and to increased activity slowly.   Follow Up Recommendations  Home health PT;Supervision/Assistance - 24 hour     Equipment Recommendations  Rolling walker with 5" wheels;3in1 (PT) (pt would like a rollator)    Recommendations for Other Services       Precautions / Restrictions Precautions Precautions: Fall Precaution Comments: abdominal pain Restrictions Weight Bearing Restrictions: No    Mobility  Bed Mobility Overal bed mobility: Modified Independent Bed Mobility: Supine to Sit     Supine to sit: Modified independent (Device/Increase time)     General bed mobility comments: increased time; HOB flat and no use of rails  Transfers Overall transfer level: Needs assistance Equipment used: None Transfers: Sit to/from Stand Sit to Stand: Min guard         General transfer comment: min guard for safety; cues for safe hand placement  Ambulation/Gait Ambulation/Gait assistance: Min guard Ambulation Distance (Feet): 110 Feet Assistive device: None Gait Pattern/deviations: Step-through pattern;Decreased stride  length Gait velocity: decreased   General Gait Details: cues for stride length and forward gaze; tolerated horizontal/vertical head turns without LOB; SOB with SpO2 97%   Stairs            Wheelchair Mobility    Modified Rankin (Stroke Patients Only)       Balance Overall balance assessment: Needs assistance Sitting-balance support: No upper extremity supported;Feet supported Sitting balance-Leahy Scale: Fair       Standing balance-Leahy Scale: Fair                      Cognition Arousal/Alertness: Awake/alert Behavior During Therapy: WFL for tasks assessed/performed Overall Cognitive Status: Within Functional Limits for tasks assessed                      Exercises      General Comments General comments (skin integrity, edema, etc.): family present      Pertinent Vitals/Pain Pain Assessment: No/denies pain    Home Living                      Prior Function            PT Goals (current goals can now be found in the care plan section) Acute Rehab PT Goals Patient Stated Goal: home asap PT Goal Formulation: With patient Time For Goal Achievement: 06/22/16 Potential to Achieve Goals: Fair Progress towards PT goals: Progressing toward goals    Frequency  Min 3X/week    PT Plan Current plan remains appropriate    Co-evaluation  End of Session Equipment Utilized During Treatment: Gait belt Activity Tolerance: Patient tolerated treatment well Patient left: with call bell/phone within reach;in bed;with family/visitor present     Time: 7195-9747 PT Time Calculation (min) (ACUTE ONLY): 22 min  Charges:  $Gait Training: 8-22 mins                    G Codes:      Salina April, PTA Pager: [phone]   06/17/2016, 3:42 PM

## 2016-06-18 LAB — BASIC METABOLIC PANEL
ANION GAP: 8 (ref 5–15)
BUN: <5 mg/dL — ABNORMAL LOW (ref 6–20)
CALCIUM: 8.6 mg/dL — AB (ref 8.9–10.3)
CO2: 26 mmol/L (ref 22–32)
CREATININE: 0.64 mg/dL (ref 0.44–1.00)
Chloride: 107 mmol/L (ref 101–111)
Glucose, Bld: 125 mg/dL — ABNORMAL HIGH (ref 65–99)
Potassium: 3.8 mmol/L (ref 3.5–5.1)
SODIUM: 141 mmol/L (ref 135–145)

## 2016-06-18 LAB — CBC
HCT: 34.3 % — ABNORMAL LOW (ref 36.0–46.0)
HEMOGLOBIN: 10.7 g/dL — AB (ref 12.0–15.0)
MCH: 28.6 pg (ref 26.0–34.0)
MCHC: 31.2 g/dL (ref 30.0–36.0)
MCV: 91.7 fL (ref 78.0–100.0)
PLATELETS: 196 10*3/uL (ref 150–400)
RBC: 3.74 MIL/uL — AB (ref 3.87–5.11)
RDW: 14.6 % (ref 11.5–15.5)
WBC: 6 10*3/uL (ref 4.0–10.5)

## 2016-06-18 LAB — GLUCOSE, CAPILLARY
GLUCOSE-CAPILLARY: 163 mg/dL — AB (ref 65–99)
Glucose-Capillary: 103 mg/dL — ABNORMAL HIGH (ref 65–99)
Glucose-Capillary: 138 mg/dL — ABNORMAL HIGH (ref 65–99)
Glucose-Capillary: 121 mg/dL — ABNORMAL HIGH (ref 65–99)
Glucose-Capillary: 204 mg/dL — ABNORMAL HIGH (ref 65–99)

## 2016-06-18 LAB — MAGNESIUM: Magnesium: 2 mg/dL (ref 1.7–2.4)

## 2016-06-18 MED ORDER — CHOLESTYRAMINE 4 G PO PACK
4.0000 g | PACK | ORAL | Status: DC
  Administered 2016-06-19 – 2016-06-20 (×2): 4 g via ORAL
  Filled 2016-06-18 (×2): qty 1

## 2016-06-18 NOTE — Progress Notes (Signed)
PROGRESS NOTE    STARSHA MORNING  DXA:[id number] DOB: April 30, 1946 DOA: 06/14/2016 PCP: [name], MD    Brief Narrative:  [name] is a 70 y.o. female with medical history of 70 year old female with a history of diabetes mellitus, non-small cell lung cancer, hyperlipidemia, GERD, CAD presented with 1 day history of abdominal pain. The patient states that she had a mechanical fall getting out of her bed on the evening of 06/13/2016.  Apparently she hit her head but did not have LOC.  When she woke up in the morning of 06/14/2016, her abdominal pain had worsened which prompted her to visit the emergency department. Patient had been in her usual state of health without any complaints. She denied any  fevers, chills, headache, chest pain, dyspnea,  dysuria, hematuria, hematochezia, and melena.  The patient had one episode of nausea and vomiting on 06/13/2016, but none since then. She has had 3 loose stools without hematochezia or melena, but none on the day of admission.  Workup in the emergency department included a CT of the abdomen and pelvis revealed acute diverticulitis of the sigmoid colon. 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 CT of the head and CT of the cervical spine were unremarkable. BMP and LFTs were unremarkable. WBC was 19.0, lactic acid 1.80.  The patient was given 1.5 L of normal saline and started on Zosyn.   Assessment & Plan:   Principal Problem:   Diverticulitis of sigmoid colon Active Problems:   Sepsis ([place])   GERD   Acute diverticulitis   Controlled type 2 diabetes mellitus with hyperglycemia, with long-term current use of insulin ([place])   Hyperlipidemia   Arterial hypotension  #1 acute sigmoid diverticulitis Patient 70 year old female presented to the ED with 1 day history of abdominal pain. CT abdomen and pelvis consistent with acute diverticulitis of the sigmoid colon. Patient noted to have a leukocytosis with a white  count of 19 and a lactic acid level I.80. Patient also noted to have borderline blood pressure. Patient does state abdominal pain improved from admission. Leukocytosis trending down with a white count of 7.5 from 10.7 from 13.9. Lactic acid level at 1.3. Pro-calcitonin of 0.32. Patient tolerating full liquids. Advance to soft diet. Continue probiotic and empiric IV Zosyn. If continued improvement will transition to oral antibiotics tomorrow.   #2 sepsis Sepsis secondary to acute diverticulitis. Patient met criteria for sepsis on admission with tachycardia, leukocytosis and borderline blood pressure/hypotension. Patient currently afebrile. Leukocytosis trending down. Lactic acid level trending down. Patient has been pancultured results pending. Patient was complaining of watery loose stools, which has since improved on probiotic and Questran. C. difficile PCR was negative. Continue IV Zosyn. IV fluids.   #3 hypotension/borderline blood pressure Secondary to problem #1 and 2. Blood pressure improved. Patient has been pancultured. Continue IV fluids. Continue empiric IV antibiotics.  #4 diabetes mellitus type 2 Hemoglobin A1c was 7.50 03/03/2016. Hold oral hypoglycemic agents. Continue half home dose 70/30. Sliding scale insulin.  #5 coronary artery disease Stable. Patient currently asymptomatic. Continue aspirin and statin.  #6 hyperlipidemia Continue statin.  #7 COPD Stable. Continue PPI, Claritin, Flovent, albuterol nebs as needed.  #8 diarrhea Patient complaining of multiple loose watery stools. C. difficile PCR was negative. Improved. Continue probiotic and Questran.    DVT prophylaxis: Lovenox Code Status: Full Family Communication: Updated patient. No family at bedside. Disposition Plan: Home when medically stable and tolerating a solid diet with no further  abdominal pain or significant improvement with abdominal pain.   Consultants:   None  Procedures:   CT abdomen and pelvis  06/14/2016  CT chest 06/14/2016  CT head 06/14/2016  Antimicrobials:   IV Zosyn 06/14/2016   Subjective: Patient sleeping however easily arousable. Patient states diarrhea has improved significantly. No nausea. No vomiting. Tolerating full liquid diet.   Objective: Vitals:   06/17/16 0629 06/17/16 1346 06/17/16 2029 06/18/16 0614  BP: (!) 106/49 (!) 90/19 (!) 134/55 (!) 131/54  Pulse: 86 88 81 86  Resp: 20 20 20 20   Temp: 98.6 F (37 C) 97.7 F (36.5 C) 97.8 F (36.6 C) 98.1 F (36.7 C)  TempSrc: Oral Oral Oral Oral  SpO2: 93% 98% 99% 97%  Weight:      Height:        Intake/Output Summary (Last 24 hours) at 06/18/16 1315 Last data filed at 06/18/16 1045  Gross per 24 hour  Intake             1984 ml  Output             2600 ml  Net             -616 ml   Filed Weights   06/14/16 0941  Weight: 81.2 kg (179 lb)    Examination:  General exam: Appears calm and comfortable  Respiratory system: Clear to auscultation. Respiratory effort normal. Cardiovascular system: S1 & S2 heard, RRR. No JVD, murmurs, rubs, gallops or clicks. No pedal edema. Gastrointestinal system: Abdomen is nondistended, soft and Significantly less tender to palpation. Positive bowel sounds.  Central nervous system: Alert and oriented. No focal neurological deficits. Extremities: Symmetric 5 x 5 power. Skin: No rashes, lesions or ulcers Psychiatry: Judgement and insight appear normal. Mood & affect appropriate.     Data Reviewed: I have personally reviewed following labs and imaging studies  CBC:  Recent Labs Lab 06/14/16 1158 06/15/16 0526 06/16/16 0621 06/17/16 0519 06/18/16 0647  WBC 19.0* 13.9* 10.7* 7.5 6.0  NEUTROABS 16.9*  --   --   --   --   HGB 14.8 11.4* 11.0* 10.8* 10.7*  HCT 46.0 36.2 34.1* 33.8* 34.3*  MCV 92.6 91.4 91.2 91.4 91.7  PLT 218 188 166 183 465   Basic Metabolic Panel:  Recent Labs Lab 06/14/16 1158 06/15/16 0526 06/16/16 0621 06/17/16 0519  06/18/16 0647  NA 138 137 139 139 141  K 4.3 3.4* 3.6 3.4* 3.8  CL 104 105 108 108 107  CO2 26 23 24 23 26   GLUCOSE 133* 104* 105* 93 125*  BUN 9 6 <5* <5* <5*  CREATININE 0.80 0.64 0.65 0.56 0.64  CALCIUM 9.3 8.1* 8.0* 8.3* 8.6*  MG  --   --  2.0 2.0 2.0   GFR: Estimated Creatinine Clearance: 67.3 mL/min (by C-G formula based on SCr of 0.8 mg/dL). Liver Function Tests:  Recent Labs Lab 06/14/16 1158  AST 22  ALT 19  ALKPHOS 105  BILITOT 0.9  PROT 7.8  ALBUMIN 4.0    Recent Labs Lab 06/14/16 1158  LIPASE 23   No results for input(s): AMMONIA in the last 168 hours. Coagulation Profile:  Recent Labs Lab 06/14/16 2032  INR 1.20   Cardiac Enzymes: No results for input(s): CKTOTAL, CKMB, CKMBINDEX, TROPONINI in the last 168 hours. BNP (last 3 results) No results for input(s): PROBNP in the last 8760 hours. HbA1C: No results for input(s): HGBA1C in the last 72 hours. CBG:  Recent Labs  Lab 06/17/16 2319 06/18/16 0001 06/18/16 0406 06/18/16 0842 06/18/16 1206  GLUCAP 102* 100* 103* 138* 163*   Lipid Profile: No results for input(s): CHOL, HDL, LDLCALC, TRIG, CHOLHDL, LDLDIRECT in the last 72 hours. Thyroid Function Tests: No results for input(s): TSH, T4TOTAL, FREET4, T3FREE, THYROIDAB in the last 72 hours. Anemia Panel: No results for input(s): VITAMINB12, FOLATE, FERRITIN, TIBC, IRON, RETICCTPCT in the last 72 hours. Sepsis Labs:  Recent Labs Lab 06/14/16 1220 06/14/16 1652 06/14/16 2032 06/15/16 0014  PROCALCITON  --   --  0.32  --   LATICACIDVEN 1.86 1.85 1.3 1.3    Recent Results (from the past 240 hour(s))  Urine culture     Status: None   Collection Time: 06/14/16 12:49 PM  Result Value Ref Range Status   Specimen Description URINE, CLEAN CATCH  Final   Special Requests NONE  Final   Culture NO GROWTH  Final   Report Status 06/15/2016 FINAL  Final  Culture, blood (routine x 2)     Status: None (Preliminary result)   Collection Time:  06/14/16  4:00 PM  Result Value Ref Range Status   Specimen Description BLOOD LEFT ANTECUBITAL  Final   Special Requests BOTTLES DRAWN AEROBIC AND ANAEROBIC 5CC  Final   Culture NO GROWTH 3 DAYS  Final   Report Status PENDING  Incomplete  Culture, blood (routine x 2)     Status: None (Preliminary result)   Collection Time: 06/14/16  4:04 PM  Result Value Ref Range Status   Specimen Description BLOOD RIGHT ANTECUBITAL  Final   Special Requests BOTTLES DRAWN AEROBIC ONLY 5CC  Final   Culture NO GROWTH 3 DAYS  Final   Report Status PENDING  Incomplete  C difficile quick scan w PCR reflex     Status: None   Collection Time: 06/17/16  6:39 PM  Result Value Ref Range Status   C Diff antigen NEGATIVE NEGATIVE Final   C Diff toxin NEGATIVE NEGATIVE Final   C Diff interpretation No C. difficile detected.  Final         Radiology Studies: No results found.      Scheduled Meds: . aspirin EC  81 mg Oral QHS  . atorvastatin  80 mg Oral q1800  . cholestyramine  4 g Oral BID  . enoxaparin (LOVENOX) injection  40 mg Subcutaneous Q24H  . famotidine  20 mg Oral BID  . insulin aspart  0-9 Units Subcutaneous Q4H  . insulin aspart protamine- aspart  12 Units Subcutaneous Q supper  . insulin aspart protamine- aspart  18 Units Subcutaneous Q breakfast  . ketorolac  1 drop Left Eye QID  . loratadine  10 mg Oral QHS  . piperacillin-tazobactam (ZOSYN)  IV  3.375 g Intravenous Q8H  . prednisoLONE acetate  1 drop Left Eye QID  . saccharomyces boulardii  250 mg Oral BID  . timolol  1 drop Both Eyes q morning - 10a   Continuous Infusions: . sodium chloride 100 mL/hr at 06/18/16 0532     LOS: 4 days    Time spent: 68 minutes    THOMPSON,DANIEL, MD [facility] Pager [phone]  If 7PM-7AM, please contact night-coverage [email or web address] Password [email or web address] 06/18/2016, 1:15 PM

## 2016-06-19 LAB — BASIC METABOLIC PANEL
ANION GAP: 9 (ref 5–15)
CALCIUM: 8.6 mg/dL — AB (ref 8.9–10.3)
CO2: 25 mmol/L (ref 22–32)
Chloride: 108 mmol/L (ref 101–111)
Creatinine, Ser: 0.65 mg/dL (ref 0.44–1.00)
GFR calc Af Amer: >60 mL/min (ref >60)
GLUCOSE: 131 mg/dL — AB (ref 65–99)
POTASSIUM: 3.5 mmol/L (ref 3.5–5.1)
SODIUM: 142 mmol/L (ref 135–145)

## 2016-06-19 LAB — GLUCOSE, CAPILLARY
GLUCOSE-CAPILLARY: 125 mg/dL — AB (ref 65–99)
GLUCOSE-CAPILLARY: 133 mg/dL — AB (ref 65–99)
GLUCOSE-CAPILLARY: 147 mg/dL — AB (ref 65–99)
GLUCOSE-CAPILLARY: 163 mg/dL — AB (ref 65–99)
Glucose-Capillary: 156 mg/dL — ABNORMAL HIGH (ref 65–99)
Glucose-Capillary: 162 mg/dL — ABNORMAL HIGH (ref 65–99)

## 2016-06-19 LAB — CULTURE, BLOOD (ROUTINE X 2)
Culture: NO GROWTH
Culture: NO GROWTH

## 2016-06-19 LAB — CBC
HCT: 35.8 % — ABNORMAL LOW (ref 36.0–46.0)
Hemoglobin: 11.2 g/dL — ABNORMAL LOW (ref 12.0–15.0)
MCH: 28.4 pg (ref 26.0–34.0)
MCHC: 31.3 g/dL (ref 30.0–36.0)
MCV: 90.9 fL (ref 78.0–100.0)
PLATELETS: 206 10*3/uL (ref 150–400)
RBC: 3.94 MIL/uL (ref 3.87–5.11)
RDW: 14.5 % (ref 11.5–15.5)
WBC: 5.5 10*3/uL (ref 4.0–10.5)

## 2016-06-19 LAB — MAGNESIUM: MAGNESIUM: 2.2 mg/dL (ref 1.7–2.4)

## 2016-06-19 MED ORDER — AMOXICILLIN-POT CLAVULANATE 875-125 MG PO TABS
1.0000 | ORAL_TABLET | Freq: Two times a day (BID) | ORAL | Status: DC
  Administered 2016-06-19 – 2016-06-20 (×3): 1 via ORAL
  Filled 2016-06-19 (×3): qty 1

## 2016-06-19 MED ORDER — CIPROFLOXACIN HCL 500 MG PO TABS
500.0000 mg | ORAL_TABLET | Freq: Two times a day (BID) | ORAL | Status: DC
  Administered 2016-06-19: 500 mg via ORAL
  Filled 2016-06-19: qty 1

## 2016-06-19 MED ORDER — METRONIDAZOLE 500 MG PO TABS
500.0000 mg | ORAL_TABLET | Freq: Three times a day (TID) | ORAL | Status: DC
  Administered 2016-06-19: 500 mg via ORAL
  Filled 2016-06-19 (×2): qty 1

## 2016-06-19 MED ORDER — POTASSIUM CHLORIDE CRYS ER 20 MEQ PO TBCR
40.0000 meq | EXTENDED_RELEASE_TABLET | Freq: Once | ORAL | Status: AC
  Administered 2016-06-19: 40 meq via ORAL
  Filled 2016-06-19: qty 2

## 2016-06-19 NOTE — Progress Notes (Signed)
PROGRESS NOTE    [name]  XYI:[id number] DOB: Nov 18, 1945 DOA: 06/14/2016 PCP: [name], MD    Brief Narrative:  [name] is a 70 y.o. female with medical history of 70 year old female with a history of diabetes mellitus, non-small cell lung cancer, hyperlipidemia, GERD, CAD presented with 1 day history of abdominal pain. The patient states that she had a mechanical fall getting out of her bed on the evening of 06/13/2016.  Apparently she hit her head but did not have LOC.  When she woke up in the morning of 06/14/2016, her abdominal pain had worsened which prompted her to visit the emergency department. Patient had been in her usual state of health without any complaints. She denied any  fevers, chills, headache, chest pain, dyspnea,  dysuria, hematuria, hematochezia, and melena.  The patient had one episode of nausea and vomiting on 06/13/2016, but none since then. She has had 3 loose stools without hematochezia or melena, but none on the day of admission.  Workup in the emergency department included a CT of the abdomen and pelvis revealed acute diverticulitis of the sigmoid colon. 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 CT of the head and CT of the cervical spine were unremarkable. BMP and LFTs were unremarkable. WBC was 19.0, lactic acid 1.80.  The patient was given 1.5 L of normal saline and started on Zosyn.   Assessment & Plan:   Principal Problem:   Diverticulitis of sigmoid colon Active Problems:   Sepsis ([place])   GERD   Acute diverticulitis   Controlled type 2 diabetes mellitus with hyperglycemia, with long-term current use of insulin ([place])   Hyperlipidemia   Arterial hypotension  #1 acute sigmoid diverticulitis Patient 70 year old female presented to the ED with 1 day history of abdominal pain. CT abdomen and pelvis consistent with acute diverticulitis of the sigmoid colon. Patient noted to have a leukocytosis with a white  count of 19 and a lactic acid level I.80. Patient also noted to have borderline blood pressure which has since improved. Patient does state abdominal pain improved from admission. Leukocytosis trending down with a white count of 5.5 from 6.0 from 7.5 from 10.7 from 13.9. Lactic acid level at 1.3. Pro-calcitonin of 0.32. Patient was tried on a solid diet however became very nauseous. Will place patient back on a full liquid diet. Continue probiotic. Change IV Zosyn to oral Augmentin. Follow.   #2 sepsis Sepsis secondary to acute diverticulitis. Patient met criteria for sepsis on admission with tachycardia, leukocytosis and borderline blood pressure/hypotension. Patient currently afebrile. Leukocytosis trending down. Lactic acid level trending down. Patient has been pancultured results negative to date. Patient states some improvement would watery loose stools. C. difficile PCR negative. Loose stools have improved on probiotic and Questran. Change IV Zosyn to oral Augmentin. Saline lock IV fluids.   #3 hypotension/borderline blood pressure Secondary to problem #1 and 2. Blood pressure improved. Patient has been pancultured. Saline lock IV fluids. Continue empiric antibiotics.  #4 diabetes mellitus type 2 Hemoglobin A1c was 7.50 03/03/2016. Hold oral hypoglycemic agents. Continue half home dose 70/30. Sliding scale insulin.  #5 coronary artery disease Stable. Patient currently asymptomatic. Continue aspirin and statin.  #6 hyperlipidemia Continue statin.  #7 COPD Stable. Continue PPI, Claritin, Flovent, albuterol nebs as needed.  #8 diarrhea Patient complaining of multiple loose watery stools. C. difficile PCR was negative. Improved. Continue probiotic and Questran.    DVT prophylaxis: Lovenox Code Status: Full Family  Communication: Updated patient. No family at bedside. Disposition Plan: Home when medically stable and tolerating a solid diet with no further abdominal pain or significant  improvement with abdominal pain.   Consultants:   None  Procedures:   CT abdomen and pelvis 06/14/2016  CT chest 06/14/2016  CT head 06/14/2016  Antimicrobials:   IV Zosyn 06/14/2016>>>>> 06/19/2016  Augmentin 06/19/2016   Subjective: Patient sitting on bedside commode. Patient stated felt nauseous after being started on a soft diet. Patient denies any emesis. Patient denies any abdominal pain.   Objective: Vitals:   06/18/16 1417 06/18/16 2159 06/19/16 0535 06/19/16 1234  BP: 129/60 (!) 97/49 (!) 112/58 (!) 125/53  Pulse: 90 93 78 79  Resp:  _0 Temp:  98.1 F (36.7 C) 97.8 F (36.6 C) 98.1 F (36.7 C)  TempSrc:  Oral Oral Oral  SpO2: 100% 96% 94% 98%  Weight:      Height:        Intake/Output Summary (Last 24 hours) at 06/19/16 1415 Last data filed at 06/19/16 4585  Gross per 24 hour  Intake          1956.67 ml  Output              250 ml  Net          1706.67 ml   Filed Weights   06/14/16 0941  Weight: 81.2 kg (179 lb)    Examination:  General exam: Appears calm and comfortable  Respiratory system: Clear to auscultation. Respiratory effort normal. Cardiovascular system: S1 & S2 heard, RRR. No JVD, murmurs, rubs, gallops or clicks. No pedal edema. Gastrointestinal system: Abdomen is nondistended, soft and Significantly less tender to palpation. Positive bowel sounds.  Central nervous system: Alert and oriented. No focal neurological deficits. Extremities: Symmetric 5 x 5 power. Skin: No rashes, lesions or ulcers Psychiatry: Judgement and insight appear normal. Mood & affect appropriate.     Data Reviewed: I have personally reviewed following labs and imaging studies  CBC:  Recent Labs Lab 06/14/16 1158 06/15/16 0526 06/16/16 0621 06/17/16 0519 06/18/16 0647 06/19/16 0542  WBC 19.0* 13.9* 10.7* 7.5 6.0 5.5  NEUTROABS 16.9*  --   --   --   --   --   HGB 14.8 11.4* 11.0* 10.8* 10.7* 11.2*  HCT 46.0 36.2 34.1* 33.8* 34.3* 35.8*    MCV 92.6 91.4 91.2 91.4 91.7 90.9  PLT 218 188 166 183 196 929   Basic Metabolic Panel:  Recent Labs Lab 06/15/16 0526 06/16/16 0621 06/17/16 0519 06/18/16 0647 06/19/16 0542  NA 137 139 139 141 142  K 3.4* 3.6 3.4* 3.8 3.5  CL 105 108 108 107 108  CO2 _1 GLUCOSE 104* 105* 93 125* 131*  BUN 6 <5* <5* <5* <5*  CREATININE 0.64 0.65 0.56 0.64 0.65  CALCIUM 8.1* 8.0* 8.3* 8.6* 8.6*  MG  --  2.0 2.0 2.0 2.2   GFR: Estimated Creatinine Clearance: 67.3 mL/min (by C-G formula based on SCr of 0.8 mg/dL). Liver Function Tests:  Recent Labs Lab 06/14/16 1158  AST 22  ALT 19  ALKPHOS 105  BILITOT 0.9  PROT 7.8  ALBUMIN 4.0    Recent Labs Lab 06/14/16 1158  LIPASE 23   No results for input(s): AMMONIA in the last 168 hours. Coagulation Profile:  Recent Labs Lab 06/14/16 2032  INR 1.20   Cardiac Enzymes: No results for input(s): CKTOTAL, CKMB, CKMBINDEX, TROPONINI in the last  168 hours. BNP (last 3 results) No results for input(s): PROBNP in the last 8760 hours. HbA1C: No results for input(s): HGBA1C in the last 72 hours. CBG:  Recent Labs Lab 06/18/16 2023 06/19/16 0011 06/19/16 0413 06/19/16 0807 06/19/16 1213  GLUCAP 204* 156* 125* 133* 162*   Lipid Profile: No results for input(s): CHOL, HDL, LDLCALC, TRIG, CHOLHDL, LDLDIRECT in the last 72 hours. Thyroid Function Tests: No results for input(s): TSH, T4TOTAL, FREET4, T3FREE, THYROIDAB in the last 72 hours. Anemia Panel: No results for input(s): VITAMINB12, FOLATE, FERRITIN, TIBC, IRON, RETICCTPCT in the last 72 hours. Sepsis Labs:  Recent Labs Lab 06/14/16 1220 06/14/16 1652 06/14/16 2032 06/15/16 0014  PROCALCITON  --   --  0.32  --   LATICACIDVEN 1.86 1.85 1.3 1.3    Recent Results (from the past 240 hour(s))  Urine culture     Status: None   Collection Time: 06/14/16 12:49 PM  Result Value Ref Range Status   Specimen Description URINE, CLEAN CATCH  Final   Special  Requests NONE  Final   Culture NO GROWTH  Final   Report Status 06/15/2016 FINAL  Final  Culture, blood (routine x 2)     Status: None   Collection Time: 06/14/16  4:00 PM  Result Value Ref Range Status   Specimen Description BLOOD LEFT ANTECUBITAL  Final   Special Requests BOTTLES DRAWN AEROBIC AND ANAEROBIC 5CC  Final   Culture NO GROWTH 5 DAYS  Final   Report Status 06/19/2016 FINAL  Final  Culture, blood (routine x 2)     Status: None   Collection Time: 06/14/16  4:04 PM  Result Value Ref Range Status   Specimen Description BLOOD RIGHT ANTECUBITAL  Final   Special Requests BOTTLES DRAWN AEROBIC ONLY 5CC  Final   Culture NO GROWTH 5 DAYS  Final   Report Status 06/19/2016 FINAL  Final  C difficile quick scan w PCR reflex     Status: None   Collection Time: 06/17/16  6:39 PM  Result Value Ref Range Status   C Diff antigen NEGATIVE NEGATIVE Final   C Diff toxin NEGATIVE NEGATIVE Final   C Diff interpretation No C. difficile detected.  Final         Radiology Studies: No results found.      Scheduled Meds: . amoxicillin-clavulanate  1 tablet Oral Q12H  . aspirin EC  81 mg Oral QHS  . atorvastatin  80 mg Oral q1800  . cholestyramine  4 g Oral Q24H  . enoxaparin (LOVENOX) injection  40 mg Subcutaneous Q24H  . famotidine  20 mg Oral BID  . insulin aspart  0-9 Units Subcutaneous Q4H  . insulin aspart protamine- aspart  12 Units Subcutaneous Q supper  . insulin aspart protamine- aspart  18 Units Subcutaneous Q breakfast  . ketorolac  1 drop Left Eye QID  . loratadine  10 mg Oral QHS  . prednisoLONE acetate  1 drop Left Eye QID  . saccharomyces boulardii  250 mg Oral BID  . timolol  1 drop Both Eyes q morning - 10a   Continuous Infusions:     LOS: 5 days    Time spent: 35 minutes    Eldoris Beiser, MD [facility] Pager [phone]  If 7PM-7AM, please contact night-coverage [email or web address] Password [email or web address] 06/19/2016, 2:15 PM

## 2016-06-19 NOTE — Progress Notes (Signed)
Patient had poor intake for breakfast. Vomited after breakfast. Refused lunch. Dr. Grandville Silos notified. Rolland Porter, BSN, RN

## 2016-06-20 LAB — BASIC METABOLIC PANEL
ANION GAP: 12 (ref 5–15)
BUN: <5 mg/dL — ABNORMAL LOW (ref 6–20)
CO2: 24 mmol/L (ref 22–32)
Calcium: 9.1 mg/dL (ref 8.9–10.3)
Chloride: 104 mmol/L (ref 101–111)
Creatinine, Ser: 0.57 mg/dL (ref 0.44–1.00)
GFR calc Af Amer: >60 mL/min (ref >60)
GLUCOSE: 100 mg/dL — AB (ref 65–99)
POTASSIUM: 3.7 mmol/L (ref 3.5–5.1)
Sodium: 140 mmol/L (ref 135–145)

## 2016-06-20 LAB — GLUCOSE, CAPILLARY
GLUCOSE-CAPILLARY: 102 mg/dL — AB (ref 65–99)
GLUCOSE-CAPILLARY: 120 mg/dL — AB (ref 65–99)
GLUCOSE-CAPILLARY: 79 mg/dL (ref 65–99)
Glucose-Capillary: 102 mg/dL — ABNORMAL HIGH (ref 65–99)
Glucose-Capillary: 182 mg/dL — ABNORMAL HIGH (ref 65–99)
Glucose-Capillary: 201 mg/dL — ABNORMAL HIGH (ref 65–99)

## 2016-06-20 MED ORDER — INSULIN ISOPHANE & REGULAR (HUMAN 70-30)100 UNIT/ML KWIKPEN: PEN_INJECTOR | SUBCUTANEOUS | 0 refills | Status: DC

## 2016-06-20 MED ORDER — AMOXICILLIN-POT CLAVULANATE 875-125 MG PO TABS: 1.0000 | ORAL_TABLET | Freq: Two times a day (BID) | ORAL | 0 refills | Status: AC

## 2016-06-20 MED ORDER — CHOLESTYRAMINE 4 G PO PACK: 4.0000 g | PACK | Freq: Two times a day (BID) | ORAL | 0 refills | Status: DC

## 2016-06-20 MED ORDER — SACCHAROMYCES BOULARDII 250 MG PO CAPS: 250.0000 mg | ORAL_CAPSULE | Freq: Two times a day (BID) | ORAL | Status: DC

## 2016-06-20 NOTE — Progress Notes (Signed)
[name] to be D/C'd to home with home health per MD order.  Discussed with the patient and all questions fully answered.  VSS, Skin clean, dry and intact without evidence of skin break down, no evidence of skin tears noted. IV catheter discontinued intact. Site without signs and symptoms of complications. Dressing and pressure applied.  An After Visit Summary was printed and given to the patient. Patient received prescriptions.  D/c education completed with patient/family including follow up instructions, medication list, d/c activities limitations if indicated, with other d/c instructions as indicated by MD - patient able to verbalize understanding, all questions fully answered.   Patient instructed to return to ED, call 911, or call MD for any changes in condition.   Patient escorted via [place], and D/C home via private auto.  Lynann Beaver 06/20/2016 6:24 PM

## 2016-06-20 NOTE — Discharge Summary (Signed)
Physician Discharge Summary  [name]:[id number] DOB: 06/30/1946 DOA: 06/14/2016  PCP: [name], MD  Admit date: 06/14/2016 Discharge date: 06/20/2016  Time spent: 65 minutes  Recommendations for Outpatient Follow-up:  1. Patient be discharged home. Patient will follow-up with MCGOWEN,PHILIP H, MD in 1-2 weeks. On follow-up patient will need a basic metabolic profile done to follow-up on electrolytes and renal function. Patient's acute diverticulitis will need to be reassessed. Patient is being discharged home on 5 more days of oral Augmentin to complete a ten-day course of antibiotic treatment.   Discharge Diagnoses:  Principal Problem:   Diverticulitis of sigmoid colon Active Problems:   Sepsis ([name])   GERD   Acute diverticulitis   Controlled type 2 diabetes mellitus with hyperglycemia, with long-term current use of insulin ([name])   Hyperlipidemia   Arterial hypotension   Discharge Condition: Stable and improved.  Diet recommendation: Full liquid diet and then transition to a soft diet in 1-2 days with carb modified diet.  Filed Weights   06/14/16 0941  Weight: 81.2 kg (179 lb)    History of present illness:  Per Dr Tat Weyman [name] is a 70 y.o. female with medical history of 70 year old female with a history of diabetes mellitus, non-small cell lung cancer, hyperlipidemia, GERD, CAD presented with 1 day history of abdominal pain. The patient stated that she had a mechanical fall getting out of her bed on the evening of 06/13/2016.  Apparently she hit her head but did not have LOC.  When she woke up in the morning of 06/14/2016, her abdominal pain had worsened which prompted her to visit the emergency department. Patient had been in her usual state of health without any complaints. She denied any  fevers, chills, headache, chest pain, dyspnea,  dysuria, hematuria, hematochezia, and melena.  The patient had one episode of nausea and vomiting on 06/13/2016, but  none since then. She has had 3 loose stools without hematochezia or melena, but none on the day of admission.  Workup in the emergency department included a CT of the abdomen and pelvis revealed acute diverticulitis of the sigmoid colon. 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 CT of the head and CT of the cervical spine were unremarkable. BMP and LFTs were unremarkable. WBC was 19.0, lactic acid 1.80.  The patient was given 1.5 L of normal saline and started on Zosyn.   Hospital Course:  #1 acute sigmoid diverticulitis Patient 70 year old female presented to the ED with 1 day history of abdominal pain. CT abdomen and pelvis consistent with acute diverticulitis of the sigmoid colon. Patient noted to have a leukocytosis with a white count of 19 and a lactic acid level I.80. Patient also noted to have borderline blood pressure which has since improved. Patient did state abdominal pain improved from admission. Leukocytosis trended down and had resolved by day of discharge. Lactic acid level also trended down. Patient was initially placed on clear liquids and advance to a soft diet. While on a soft diet patient did have some nausea subsequently was placed back on a full liquid diet which she tolerated. Patient was maintained empirically on IV Zosyn and subsequently transitioned to oral Augmentin. Patient be discharged home on 5 more days of oral Augmentin with outpatient follow-up.  #2 sepsis Sepsis secondary to acute diverticulitis. Patient met criteria for sepsis on admission with tachycardia, leukocytosis and borderline blood pressure/hypotension. Patient remained afebrile. Leukocytosis trended down and had resolved by day  of discharge. Lactic acid level also trended down.Patient has been pancultured results negative to date. C. difficile PCR negative. Patient was maintained empirically on IV Zosyn during the hospitalization. Patient improved clinically and transitioned to  oral Augmentin on be discharged home on 5 more days of oral Augmentin.   #3 hypotension/borderline blood pressure Secondary to problem #1 and 2. Blood pressure improved with hydration. Patient has been pancultured with no growth to date. Patient was maintained empirically on IV Zosyn and subsequently transitioned to oral Augmentin which she tolerated. Outpatient follow-up.  #4 diabetes mellitus type 2 Hemoglobin A1c was 7.50 03/03/2016. Patient's oral hypoglycemic agents were held. Patient was placed on half home dose of 70/30 with good blood glucose control. Patient will be discharged on a full liquid diet and is to advance to a soft diet as tolerated. Patient be discharged home on 70/30 at a reduced dosed on her home dose of 16 units at breakfast and 20 units at supper. Outpatient follow-up.   #5 coronary artery disease Stable. Patient currently asymptomatic. Continued on aspirin and statin.  #6 hyperlipidemia Continued on statin.  #7 COPD Stable. Continued on PPI, Claritin, Flovent, albuterol nebs as needed.  #8 diarrhea Patient complained of multiple loose watery stools/soft stools during the hospitalization. C. difficile PCR was negative. Patient was placed on probiotic and Questran with improvement.   Procedures:  CT abdomen and pelvis 06/14/2016  CT chest 06/14/2016  CT head 06/14/2016   Consultations:  None  Discharge Exam: Vitals:   06/20/16 0530 06/20/16 1400  BP: (!) 123/43 116/61  Pulse: 80 89  Resp: 18 16  Temp: 98.3 F (36.8 C) 98.2 F (36.8 C)    General: NAD Cardiovascular: RRR Respiratory: CTAB Abdomen: Soft, nontender, nondistended, positive bowel sounds.  Discharge Instructions   Discharge Instructions    AMB Referral to [facility]    Complete by:  As directed   Reason for consult:  Post hospital monitoring   Diagnoses of:   COPD/ Pneumonia Diabetes     Expected date of contact:  1-3 days (reserved for hospital discharges)    Please assign to community nurse for transition of care calls and assess for home visits. Questions please call:   Natividad Brood, RN BSN [facility] Liaison  [phone] business mobile phone Toll free office [phone]   Diet Carb Modified    Complete by:  As directed   Full liquid diet for 1-2 days then advance to soft diet.   Discharge instructions    Complete by:  As directed   Follow up with MCGOWEN,PHILIP H, MD in 1-2 weeks.   Increase activity slowly    Complete by:  As directed     Current Discharge Medication List    START taking these medications   Details  amoxicillin-clavulanate (AUGMENTIN) 875-125 MG tablet Take 1 tablet by mouth every 12 (twelve) hours. Take for 5 days then stop. Qty: 10 tablet, Refills: 0    cholestyramine (QUESTRAN) 4 g packet Take 1 packet (4 g total) by mouth 2 (two) times daily. Qty: 10 each, Refills: 0    saccharomyces boulardii (FLORASTOR) 250 MG capsule Take 1 capsule (250 mg total) by mouth 2 (two) times daily.      CONTINUE these medications which have CHANGED   Details  Insulin Isophane & Regular Human (HUMULIN 70/30 KWIKPEN) (70-30) 100 UNIT/ML PEN Inject 16 units subcutaneously before breakfast and 20 units before supper Qty: 15 mL, Refills: 0  CONTINUE these medications which have NOT CHANGED   Details  albuterol (PROVENTIL) (2.5 MG/3ML) 0.083% nebulizer solution USE ONE VIAL IN NEBULIZER EVERY 4 HOURS AS NEEDED FOR WHEEZING OR SHORTNESS OF BREATH Qty: 75 mL, Refills: 3    aspirin 81 MG tablet Take 81 mg by mouth at bedtime.     aspirin-acetaminophen-caffeine (EXCEDRIN MIGRAINE) 250-250-65 MG per tablet Take 1 tablet by mouth every 6 (six) hours as needed for headache.    atorvastatin (LIPITOR) 80 MG tablet Take 1 tablet (80 mg total) by mouth daily. Qty: 90 tablet, Refills: 1   Associated Diagnoses: Hyperlipidemia; Hyperlipidemia, mixed    FLOVENT HFA 220 MCG/ACT inhaler INHALE TWO PUFFS BY MOUTH  TWICE DAILY Qty: 12 g, Refills: 6    fluticasone (FLONASE) 50 MCG/ACT nasal spray Place 2 sprays into both nostrils at bedtime. Qty: 16 g, Refills: 3    glucose blood (ONE TOUCH ULTRA TEST) test strip TEST 3 TIMES A DAY Qty: 100 each, Refills: 11    loratadine (CLARITIN) 10 MG tablet Take 10 mg by mouth at bedtime.     metFORMIN (GLUCOPHAGE) 500 MG tablet TAKE ONE TABLET BY MOUTH TWICE DAILY WITH MEALS Qty: 180 tablet, Refills: 1    ONETOUCH DELICA LANCETS 92J MISC TEST 3 TIMES A DAY Qty: 100 each, Refills: 11    pantoprazole (PROTONIX) 40 MG tablet TAKE ONE TABLET BY MOUTH ONCE DAILY Qty: 30 tablet, Refills: 12    prednisoLONE acetate (PRED FORTE) 1 % ophthalmic suspension Place 1 drop into the left eye 4 (four) times daily.    PROLENSA 0.07 % SOLN Place 1 drop into the left eye at bedtime.    timolol (BETIMOL) 0.5 % ophthalmic solution Place 1 drop into both eyes every morning.    nitroGLYCERIN (NITROSTAT) 0.4 MG SL tablet Place 1 tablet (0.4 mg total) under the tongue every 5 (five) minutes as needed. For chest pain Qty: 50 tablet, Refills: 3       Allergies  Allergen Reactions  . Lactose Intolerance (Gi) Diarrhea  . Ibuprofen     REACTION: Anxiousness, hypoventilate    Follow-up Information    [place] .   Why:  Home health PT arranged Contact information: [address] [place] [address] [phone]        [facility] - Dme [facility] .   Why:  Rolator and 3 in 1 will be delivered to room prior to discharge Contact information: [address] [place] [address] [phone]        [name], MD. Schedule an appointment as soon as possible for a visit in 2 week(s).   Specialty:  Family Medicine Why:  f/u in 1-2 weeks. Contact information: [address] [place] [address] [phone]            The results of significant diagnostics from this hospitalization (including imaging,  microbiology, ancillary and laboratory) are listed below for reference.    Significant Diagnostic Studies: Ct Head Wo Contrast  Result Date: 06/14/2016 CLINICAL DATA:  Un- witnessed fall EXAM: CT HEAD WITHOUT CONTRAST CT CERVICAL SPINE WITHOUT CONTRAST TECHNIQUE: Multidetector CT imaging of the head and cervical spine was performed following the standard protocol without intravenous contrast. Multiplanar CT image reconstructions of the cervical spine were also generated. COMPARISON:  03/02/2015 and PET scan 07/26/2010 FINDINGS: CT HEAD FINDINGS No skull fracture is noted. Paranasal sinuses and mastoid air cells are unremarkable. No intracranial hemorrhage, mass effect or midline  shift. No acute cortical infarction. No mass lesion is noted on this unenhanced scan. Stable atrophy and chronic white matter disease. Ventricular size is stable from prior exam. CT CERVICAL SPINE FINDINGS Axial images of the cervical spine shows no acute fracture or subluxation. Mild degenerative changes are noted C1-C2 articulation. Computer processed images shows no acute fracture or subluxation. There is disc space flattening with mild anterior and mild posterior spurring at C4-C5 level. Mild anterior spurring at C5-C6 level. There is anterior spurring at T2-T3 level. No prevertebral soft tissue swelling. Cervical airway is patent. Emphysematous changes are noted in right upper lobe. There is consolidation with bronchiectasis and air bronchogram in right upper lobe medially. Although this may be due to chronic scarring and postradiation changes pneumonia cannot be excluded. Clinical correlation is necessary. Further evaluation with CT scan of the chest could be performed as clinically warranted. IMPRESSION: 1. No acute intracranial abnormality. Stable atrophy and chronic white matter disease. 2. No cervical spine acute fracture or subluxation. Multilevel degenerative changes as described above. 3. Emphysematous changes are noted in  right upper lobe. There is consolidation with bronchiectasis and air bronchogram in right upper lobe medially. Although this may be due to chronic scarring and postradiation changes pneumonia cannot be excluded. Clinical correlation is necessary. Further evaluation with CT scan of the chest could be performed as clinically warranted. Electronically Signed   By: [name] M.D.   On: 06/14/2016 13:11   Ct Chest W Contrast  Result Date: 06/14/2016 CLINICAL DATA:  Fall out of bed this morning. Right lower quadrant pain. History of right lung cancer, post radiation and chemotherapy. EXAM: CT CHEST, ABDOMEN, AND PELVIS WITH CONTRAST TECHNIQUE: Multidetector CT imaging of the chest, abdomen and pelvis was performed following the standard protocol during bolus administration of intravenous contrast. CONTRAST:  1 ISOVUE-300 IOPAMIDOL (ISOVUE-300) INJECTION 61% COMPARISON:  Chest CT 09/14/2015.  Abdominal CT 12/30/2015. FINDINGS: Cardiovascular: Aortic atherosclerosis. No evidence of aneurysm. Heart is normal size. Small pericardial effusion. Mediastinum/Nodes: No mediastinal, hilar, or axillary adenopathy. Lungs/Pleura: Chronic opacities and volume loss noted in the right upper lobe extending from the right hilum to the right apex, stable since prior study compatible with postradiation fibrosis. No other confluent airspace opacities. No pleural effusions. No suspicious pulmonary nodule. Upper Abdomen: Prior cholecystectomy. No acute findings in the upper abdomen. Musculoskeletal: Chest wall soft tissues are unremarkable. No acute bony abnormality or focal bone lesion. There is for a gap insert or rib CT ABDOMEN PELVIS FINDINGS Hepatobiliary: Prior cholecystectomy. No focal hepatic abnormality or biliary ductal dilatation. Pancreas: No focal abnormality or ductal dilatation. Spleen: No focal abnormality.  Normal size. Adrenals/Urinary Tract: No adrenal abnormality. No focal renal abnormality. No stones or hydronephrosis.  Urinary bladder is unremarkable. Stomach/Bowel: Sigmoid diverticulosis. Extensive inflammatory changes around the sigmoid colon compatible with active diverticulitis. No focal fluid collection/abscess. Stomach and small bowel decompressed, grossly unremarkable. Vascular/Lymphatic: Scattered aortic and iliac calcifications. No aneurysm. No adenopathy. Reproductive: Prior hysterectomy.  No adnexal masses. Other: No free fluid or free air. Musculoskeletal: No acute bony abnormality or focal bone lesion. 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 No acute cardiopulmonary disease. Sigmoid diverticulosis with changes of active diverticulitis. Small pericardial effusion. Aortic atherosclerosis. Electronically Signed   By: Rolm Baptise M.D.   On: 06/14/2016 15:00   Ct Cervical Spine Wo Contrast  Result Date: 06/14/2016 CLINICAL DATA:  Un- witnessed fall EXAM: CT HEAD WITHOUT CONTRAST CT CERVICAL SPINE WITHOUT CONTRAST TECHNIQUE: Multidetector CT imaging  of the head and cervical spine was performed following the standard protocol without intravenous contrast. Multiplanar CT image reconstructions of the cervical spine were also generated. COMPARISON:  03/02/2015 and PET scan 07/26/2010 FINDINGS: CT HEAD FINDINGS No skull fracture is noted. Paranasal sinuses and mastoid air cells are unremarkable. No intracranial hemorrhage, mass effect or midline shift. No acute cortical infarction. No mass lesion is noted on this unenhanced scan. Stable atrophy and chronic white matter disease. Ventricular size is stable from prior exam. CT CERVICAL SPINE FINDINGS Axial images of the cervical spine shows no acute fracture or subluxation. Mild degenerative changes are noted C1-C2 articulation. Computer processed images shows no acute fracture or subluxation. There is disc space flattening with mild anterior and mild posterior spurring at C4-C5 level. Mild anterior spurring at C5-C6 level. There is anterior  spurring at T2-T3 level. No prevertebral soft tissue swelling. Cervical airway is patent. Emphysematous changes are noted in right upper lobe. There is consolidation with bronchiectasis and air bronchogram in right upper lobe medially. Although this may be due to chronic scarring and postradiation changes pneumonia cannot be excluded. Clinical correlation is necessary. Further evaluation with CT scan of the chest could be performed as clinically warranted. IMPRESSION: 1. No acute intracranial abnormality. Stable atrophy and chronic white matter disease. 2. No cervical spine acute fracture or subluxation. Multilevel degenerative changes as described above. 3. Emphysematous changes are noted in right upper lobe. There is consolidation with bronchiectasis and air bronchogram in right upper lobe medially. Although this may be due to chronic scarring and postradiation changes pneumonia cannot be excluded. Clinical correlation is necessary. Further evaluation with CT scan of the chest could be performed as clinically warranted. Electronically Signed   By: [name] M.D.   On: 06/14/2016 13:11   Ct Abdomen Pelvis W Contrast  Result Date: 06/14/2016 CLINICAL DATA:  Fall out of bed this morning. Right lower quadrant pain. History of right lung cancer, post radiation and chemotherapy. EXAM: CT CHEST, ABDOMEN, AND PELVIS WITH CONTRAST TECHNIQUE: Multidetector CT imaging of the chest, abdomen and pelvis was performed following the standard protocol during bolus administration of intravenous contrast. CONTRAST:  1 ISOVUE-300 IOPAMIDOL (ISOVUE-300) INJECTION 61% COMPARISON:  Chest CT 09/14/2015.  Abdominal CT 12/30/2015. FINDINGS: Cardiovascular: Aortic atherosclerosis. No evidence of aneurysm. Heart is normal size. Small pericardial effusion. Mediastinum/Nodes: No mediastinal, hilar, or axillary adenopathy. Lungs/Pleura: Chronic opacities and volume loss noted in the right upper lobe extending from the right hilum to the  right apex, stable since prior study compatible with postradiation fibrosis. No other confluent airspace opacities. No pleural effusions. No suspicious pulmonary nodule. Upper Abdomen: Prior cholecystectomy. No acute findings in the upper abdomen. Musculoskeletal: Chest wall soft tissues are unremarkable. No acute bony abnormality or focal bone lesion. There is for a gap insert or rib CT ABDOMEN PELVIS FINDINGS Hepatobiliary: Prior cholecystectomy. No focal hepatic abnormality or biliary ductal dilatation. Pancreas: No focal abnormality or ductal dilatation. Spleen: No focal abnormality.  Normal size. Adrenals/Urinary Tract: No adrenal abnormality. No focal renal abnormality. No stones or hydronephrosis. Urinary bladder is unremarkable. Stomach/Bowel: Sigmoid diverticulosis. Extensive inflammatory changes around the sigmoid colon compatible with active diverticulitis. No focal fluid collection/abscess. Stomach and small bowel decompressed, grossly unremarkable. Vascular/Lymphatic: Scattered aortic and iliac calcifications. No aneurysm. No adenopathy. Reproductive: Prior hysterectomy.  No adnexal masses. Other: No free fluid or free air. Musculoskeletal: No acute bony abnormality or focal bone lesion. 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 No acute cardiopulmonary  disease. Sigmoid diverticulosis with changes of active diverticulitis. Small pericardial effusion. Aortic atherosclerosis. Electronically Signed   By: Rolm Baptise M.D.   On: 06/14/2016 15:00   Dg Chest Port 1 View  Result Date: 06/14/2016 CLINICAL DATA:  Code sepsis, history of lung cancer EXAM: PORTABLE CHEST 1 VIEW COMPARISON:  CT chest dated 06/14/2016. Chest radiographs dated 09/14/2015. FINDINGS: Mass-like opacity in the medial right upper lobe, unchanged from 2016, corresponding to radiation fibrosis when correlating with prior CT chest. Lungs are otherwise clear.  No pleural effusion or pneumothorax. The heart  is normal in size. 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 No evidence of acute cardiopulmonary disease. Electronically Signed   By: Julian Hy M.D.   On: 06/14/2016 20:18    Microbiology: Recent Results (from the past 240 hour(s))  Urine culture     Status: None   Collection Time: 06/14/16 12:49 PM  Result Value Ref Range Status   Specimen Description URINE, CLEAN CATCH  Final   Special Requests NONE  Final   Culture NO GROWTH  Final   Report Status 06/15/2016 FINAL  Final  Culture, blood (routine x 2)     Status: None   Collection Time: 06/14/16  4:00 PM  Result Value Ref Range Status   Specimen Description BLOOD LEFT ANTECUBITAL  Final   Special Requests BOTTLES DRAWN AEROBIC AND ANAEROBIC 5CC  Final   Culture NO GROWTH 5 DAYS  Final   Report Status 06/19/2016 FINAL  Final  Culture, blood (routine x 2)     Status: None   Collection Time: 06/14/16  4:04 PM  Result Value Ref Range Status   Specimen Description BLOOD RIGHT ANTECUBITAL  Final   Special Requests BOTTLES DRAWN AEROBIC ONLY 5CC  Final   Culture NO GROWTH 5 DAYS  Final   Report Status 06/19/2016 FINAL  Final  C difficile quick scan w PCR reflex     Status: None   Collection Time: 06/17/16  6:39 PM  Result Value Ref Range Status   C Diff antigen NEGATIVE NEGATIVE Final   C Diff toxin NEGATIVE NEGATIVE Final   C Diff interpretation No C. difficile detected.  Final     Labs: Basic Metabolic Panel:  Recent Labs Lab 06/16/16 0621 06/17/16 0519 06/18/16 0647 06/19/16 0542 06/20/16 0546  NA 139 139 141 142 140  K 3.6 3.4* 3.8 3.5 3.7  CL 108 108 107 108 104  CO2 24 23 26 25 24   GLUCOSE 105* 93 125* 131* 100*  BUN <5* <5* <5* <5* <5*  CREATININE 0.65 0.56 0.64 0.65 0.57  CALCIUM 8.0* 8.3* 8.6* 8.6* 9.1  MG 2.0 2.0 2.0 2.2  --    Liver Function Tests:  Recent Labs Lab 06/14/16 1158  AST 22  ALT 19  ALKPHOS 105  BILITOT 0.9  PROT 7.8  ALBUMIN 4.0    Recent  Labs Lab 06/14/16 1158  LIPASE 23   No results for input(s): AMMONIA in the last 168 hours. CBC:  Recent Labs Lab 06/14/16 1158 06/15/16 0526 06/16/16 0621 06/17/16 0519 06/18/16 0647 06/19/16 0542  WBC 19.0* 13.9* 10.7* 7.5 6.0 5.5  NEUTROABS 16.9*  --   --   --   --   --   HGB 14.8 11.4* 11.0* 10.8* 10.7* 11.2*  HCT 46.0 36.2 34.1* 33.8* 34.3* 35.8*  MCV 92.6 91.4 91.2 91.4 91.7 90.9  PLT 218 188 166 183 196 206   Cardiac Enzymes: No results for input(s):  CKTOTAL, CKMB, CKMBINDEX, TROPONINI in the last 168 hours. BNP: BNP (last 3 results) No results for input(s): BNP in the last 8760 hours.  ProBNP (last 3 results) No results for input(s): PROBNP in the last 8760 hours.  CBG:  Recent Labs Lab 06/20/16 0156 06/20/16 0404 06/20/16 0822 06/20/16 1234 06/20/16 1627  GLUCAP 102* 102* 120* 182* 201*       Signed:  Adryana Mogensen MD.  [facility] 06/20/2016, 5:28 PM

## 2016-06-20 NOTE — Progress Notes (Signed)
Physical Therapy Treatment Patient Details Name: [name] MRN: [id number] DOB: [birth year] Today's Date: 06/20/2016    History of Present Illness [name] is a 70 y.o. female with medical history of 70 year old female with a history of diabetes mellitus, non-small cell lung cancer, hyperlipidemia, GERD, CAD presented with 1 day history of abdominal pain. The patient states that she had a mechanical fall getting out of her bed on the evening of 06/13/2016.  Apparently she hit her head but did not have LOC.  When she woke up in the morning of 06/14/2016, her abdominal pain had worsened which prompted her to visit the emergency department.    PT Comments    Patient grossly supervision/min guard however continues to be unsteady with horizontal head turns and required min A to recover from LOB X1. Pt with SOB when ascending steps and required standing rest break during stair management. Current plan remains appropriate.   Follow Up Recommendations  Home health PT;Supervision/Assistance - 24 hour     Equipment Recommendations  Rolling walker with 5" wheels;3in1 (PT) (pt would like a rollator)    Recommendations for Other Services       Precautions / Restrictions Precautions Precautions: Fall Precaution Comments: abdominal pain Restrictions Weight Bearing Restrictions: No    Mobility  Bed Mobility Overal bed mobility: Modified Independent Bed Mobility: Supine to Sit     Supine to sit: Modified independent (Device/Increase time)     General bed mobility comments: increased time; HOB flat and no use of rails  Transfers Overall transfer level: Needs assistance Equipment used: None Transfers: Sit to/from Stand Sit to Stand: Supervision         General transfer comment: supervision for safety; pt held onto side of sink to steady self upon standing  Ambulation/Gait Ambulation/Gait assistance: Min guard;Min assist Ambulation Distance (Feet): 200 Feet Assistive  device: None Gait Pattern/deviations: Step-through pattern;Decreased stride length Gait velocity: decreased   General Gait Details: grossly min guard for safety and min A for LOB X1 with horizontal head turn; slow cadence   Stairs Stairs: Yes Stairs assistance: Min guard Stair Management: One rail Right;Forwards Number of Stairs: 5 General stair comments: educated pt on step to pattern and energy conservation; SOB with standing rest break needed after 2 steps  Wheelchair Mobility    Modified Rankin (Stroke Patients Only)       Balance Overall balance assessment: Needs assistance   Sitting balance-Leahy Scale: Fair       Standing balance-Leahy Scale: Fair                      Cognition Arousal/Alertness: Awake/alert Behavior During Therapy: WFL for tasks assessed/performed Overall Cognitive Status: Within Functional Limits for tasks assessed                      Exercises      General Comments        Pertinent Vitals/Pain Pain Assessment: Faces Faces Pain Scale: Hurts a little bit Pain Location: abdomen Pain Descriptors / Indicators: Sore Pain Intervention(s): Monitored during session;Repositioned    Home Living                      Prior Function            PT Goals (current goals can now be found in the care plan section) Acute Rehab PT Goals Patient Stated Goal: go home PT Goal Formulation: With patient Time For Goal  Achievement: 06/22/16 Potential to Achieve Goals: Fair Progress towards PT goals: Progressing toward goals    Frequency  Min 3X/week    PT Plan Current plan remains appropriate    Co-evaluation             End of Session Equipment Utilized During Treatment: Gait belt Activity Tolerance: Patient tolerated treatment well Patient left: with call bell/phone within reach;in bed;with family/visitor present     Time: 8657-8469 PT Time Calculation (min) (ACUTE ONLY): 15 min  Charges:  $Gait  Training: 8-22 mins                    G Codes:      [place] April, PTA Pager: [phone]   06/20/2016, 12:21 PM

## 2016-06-21 ENCOUNTER — Other Ambulatory Visit: Payer: Self-pay | Admitting: *Deleted

## 2016-06-21 ENCOUNTER — Telehealth: Payer: Self-pay | Admitting: Family Medicine

## 2016-06-21 DIAGNOSIS — K219 Gastro-esophageal reflux disease without esophagitis: Secondary | ICD-10-CM | POA: Diagnosis not present

## 2016-06-21 DIAGNOSIS — C3490 Malignant neoplasm of unspecified part of unspecified bronchus or lung: Secondary | ICD-10-CM | POA: Diagnosis not present

## 2016-06-21 DIAGNOSIS — Z794 Long term (current) use of insulin: Secondary | ICD-10-CM | POA: Diagnosis not present

## 2016-06-21 DIAGNOSIS — A419 Sepsis, unspecified organism: Secondary | ICD-10-CM | POA: Diagnosis not present

## 2016-06-21 DIAGNOSIS — I959 Hypotension, unspecified: Secondary | ICD-10-CM | POA: Diagnosis not present

## 2016-06-21 DIAGNOSIS — K5732 Diverticulitis of large intestine without perforation or abscess without bleeding: Secondary | ICD-10-CM | POA: Diagnosis not present

## 2016-06-21 DIAGNOSIS — E1165 Type 2 diabetes mellitus with hyperglycemia: Secondary | ICD-10-CM | POA: Diagnosis not present

## 2016-06-21 DIAGNOSIS — Z7984 Long term (current) use of oral hypoglycemic drugs: Secondary | ICD-10-CM | POA: Diagnosis not present

## 2016-06-21 DIAGNOSIS — Z7982 Long term (current) use of aspirin: Secondary | ICD-10-CM | POA: Diagnosis not present

## 2016-06-21 DIAGNOSIS — I2510 Atherosclerotic heart disease of native coronary artery without angina pectoris: Secondary | ICD-10-CM | POA: Diagnosis not present

## 2016-06-21 DIAGNOSIS — E785 Hyperlipidemia, unspecified: Secondary | ICD-10-CM | POA: Diagnosis not present

## 2016-06-21 NOTE — Telephone Encounter (Signed)
Transition Care Management Follow-up Telephone Call   Date discharged? 06/20/16 @ 5.28pm.    How have you been since you were released from the hospital? Stomach doesn't hurt and she's walking better.    Do you understand why you were in the hospital? yes   Do you understand the discharge instructions? yes   Where were you discharged to? home   Items Reviewed:  Medications reviewed: yes  Allergies reviewed: yes  Dietary changes reviewed: yes  Referrals reviewed: no   Functional Questionnaire:   Activities of Daily Living (ADLs):   She states they are independent in the following: none States they require assistance with the following: none   Any transportation issues/concerns?: no   Any patient concerns? no   Confirmed importance and date/time of follow-up visits scheduled yes  Provider Appointment booked with 06/28/16 @ 9am.   Confirmed with patient if condition begins to worsen call PCP or go to the ER.  Patient was given the office number and encouraged to call back with question or concerns.  : yes

## 2016-06-21 NOTE — Patient Outreach (Signed)
Pt is not available at present requests I call later. She says its a bad time of day, too many people coming and going in her home right now.  I will try to call her later today.  [name] [facility] [place] [phone]

## 2016-06-21 NOTE — Patient Outreach (Signed)
Called pt again at 7:30 pm. I spoke with her husband who says she has gone to the store with her daughter. He reports she has all her meds except one and they are picking it up. Home health has visited this evening also.   I told Mr. Corrow I would call again the am to speak with her.  [name] - Lanier Foster [phone]

## 2016-06-22 ENCOUNTER — Telehealth: Payer: Self-pay

## 2016-06-22 DIAGNOSIS — Z7984 Long term (current) use of oral hypoglycemic drugs: Secondary | ICD-10-CM | POA: Diagnosis not present

## 2016-06-22 DIAGNOSIS — I2510 Atherosclerotic heart disease of native coronary artery without angina pectoris: Secondary | ICD-10-CM | POA: Diagnosis not present

## 2016-06-22 DIAGNOSIS — K5732 Diverticulitis of large intestine without perforation or abscess without bleeding: Secondary | ICD-10-CM | POA: Diagnosis not present

## 2016-06-22 DIAGNOSIS — I959 Hypotension, unspecified: Secondary | ICD-10-CM | POA: Diagnosis not present

## 2016-06-22 DIAGNOSIS — E1165 Type 2 diabetes mellitus with hyperglycemia: Secondary | ICD-10-CM | POA: Diagnosis not present

## 2016-06-22 DIAGNOSIS — E785 Hyperlipidemia, unspecified: Secondary | ICD-10-CM | POA: Diagnosis not present

## 2016-06-22 DIAGNOSIS — A419 Sepsis, unspecified organism: Secondary | ICD-10-CM | POA: Diagnosis not present

## 2016-06-22 DIAGNOSIS — C3490 Malignant neoplasm of unspecified part of unspecified bronchus or lung: Secondary | ICD-10-CM | POA: Diagnosis not present

## 2016-06-22 DIAGNOSIS — Z7982 Long term (current) use of aspirin: Secondary | ICD-10-CM | POA: Diagnosis not present

## 2016-06-22 DIAGNOSIS — Z794 Long term (current) use of insulin: Secondary | ICD-10-CM | POA: Diagnosis not present

## 2016-06-22 DIAGNOSIS — K219 Gastro-esophageal reflux disease without esophagitis: Secondary | ICD-10-CM | POA: Diagnosis not present

## 2016-06-22 NOTE — Telephone Encounter (Signed)
Are they setting this stuff up for her or are they requesting that [place] set it up?

## 2016-06-22 NOTE — Telephone Encounter (Signed)
lmom for [name] about setting up her speech, pt and ot when she arrives on her appointment on 06/28/16

## 2016-06-22 NOTE — Telephone Encounter (Signed)
Herbert Deaner called twice and stated that patient was seen in hospital for high fall risk and orthostatic hypertension. 20 pt drop when she goes from sitting to standing. Medication change and would like to set up  1: Speech therapy: education caregiver and medical social worker evaluation 2: Physical Therapy: 2 x weekly for 4 weeks for  3: Occupational  Therapy: for ADO med change and diabetes management.

## 2016-06-22 NOTE — Telephone Encounter (Signed)
They want [place] to set it up since she was seen at the hospitals. Some medications were changed and I think they are causing her to fall so Mr. Hoffman wanted to make you aware of that

## 2016-06-22 NOTE — Telephone Encounter (Signed)
OK. I'll have to wait until I see her in office for hospital f/u in order to set this up.  She should already have an appt scheduled for this.-thx

## 2016-06-22 NOTE — Telephone Encounter (Signed)
Merry Proud from [facility] contacted me to verify message he received.  Pt doesn't need these ordered, Merry Proud is already performing OT and PT and wanted to extend.  He plans to see pt on Friday to continue therapy.  Spoke to Dr Anitra Lauth and he is okay with extending additional PT and OT.

## 2016-06-24 ENCOUNTER — Telehealth: Payer: Self-pay | Admitting: Family Medicine

## 2016-06-24 DIAGNOSIS — E1165 Type 2 diabetes mellitus with hyperglycemia: Secondary | ICD-10-CM | POA: Diagnosis not present

## 2016-06-24 DIAGNOSIS — Z7984 Long term (current) use of oral hypoglycemic drugs: Secondary | ICD-10-CM | POA: Diagnosis not present

## 2016-06-24 DIAGNOSIS — C3490 Malignant neoplasm of unspecified part of unspecified bronchus or lung: Secondary | ICD-10-CM | POA: Diagnosis not present

## 2016-06-24 DIAGNOSIS — K5732 Diverticulitis of large intestine without perforation or abscess without bleeding: Secondary | ICD-10-CM | POA: Diagnosis not present

## 2016-06-24 DIAGNOSIS — Z7982 Long term (current) use of aspirin: Secondary | ICD-10-CM | POA: Diagnosis not present

## 2016-06-24 DIAGNOSIS — Z794 Long term (current) use of insulin: Secondary | ICD-10-CM | POA: Diagnosis not present

## 2016-06-24 DIAGNOSIS — A419 Sepsis, unspecified organism: Secondary | ICD-10-CM | POA: Diagnosis not present

## 2016-06-24 DIAGNOSIS — E785 Hyperlipidemia, unspecified: Secondary | ICD-10-CM | POA: Diagnosis not present

## 2016-06-24 DIAGNOSIS — I959 Hypotension, unspecified: Secondary | ICD-10-CM | POA: Diagnosis not present

## 2016-06-24 DIAGNOSIS — I2510 Atherosclerotic heart disease of native coronary artery without angina pectoris: Secondary | ICD-10-CM | POA: Diagnosis not present

## 2016-06-24 DIAGNOSIS — K219 Gastro-esophageal reflux disease without esophagitis: Secondary | ICD-10-CM | POA: Diagnosis not present

## 2016-06-24 NOTE — Telephone Encounter (Signed)
RN, Herbert Deaner, from [facility] called stating pt was going to change insulin dose without instructions because she was not happy with her glucose numbers. 06/21/16  - 162 am, 120 pm 06/22/16  - 147 am, 178 pm 06/23/16  - 160 am, 146 pm  Hospital told pt to use 16 units in am and 20 units pm.  Pt and husband were changing insulin to 38 am, and 34 pm  Contacted patient right away and explained per Dr Anitra Lauth, the max increase would be 20 am and 24 pm.  Patient aware and expressed understanding and cooperation.   Patient does have appt 06/28/16.

## 2016-06-24 NOTE — Telephone Encounter (Signed)
Noted. Agree.

## 2016-06-28 ENCOUNTER — Telehealth: Payer: Self-pay | Admitting: Family Medicine

## 2016-06-28 ENCOUNTER — Ambulatory Visit (INDEPENDENT_AMBULATORY_CARE_PROVIDER_SITE_OTHER): Payer: Medicare Other | Admitting: Family Medicine

## 2016-06-28 ENCOUNTER — Encounter: Payer: Self-pay | Admitting: *Deleted

## 2016-06-28 ENCOUNTER — Encounter: Payer: Self-pay | Admitting: Family Medicine

## 2016-06-28 ENCOUNTER — Other Ambulatory Visit: Payer: Self-pay | Admitting: *Deleted

## 2016-06-28 VITALS — BP 107/75 | HR 105 | Temp 97.3°F | Resp 16 | Ht 61.5 in | Wt 167.4 lb

## 2016-06-28 DIAGNOSIS — E1165 Type 2 diabetes mellitus with hyperglycemia: Secondary | ICD-10-CM | POA: Diagnosis not present

## 2016-06-28 DIAGNOSIS — C3490 Malignant neoplasm of unspecified part of unspecified bronchus or lung: Secondary | ICD-10-CM | POA: Diagnosis not present

## 2016-06-28 DIAGNOSIS — Z794 Long term (current) use of insulin: Secondary | ICD-10-CM | POA: Diagnosis not present

## 2016-06-28 DIAGNOSIS — I2510 Atherosclerotic heart disease of native coronary artery without angina pectoris: Secondary | ICD-10-CM | POA: Diagnosis not present

## 2016-06-28 DIAGNOSIS — K5732 Diverticulitis of large intestine without perforation or abscess without bleeding: Secondary | ICD-10-CM | POA: Diagnosis not present

## 2016-06-28 DIAGNOSIS — K219 Gastro-esophageal reflux disease without esophagitis: Secondary | ICD-10-CM | POA: Diagnosis not present

## 2016-06-28 DIAGNOSIS — K5792 Diverticulitis of intestine, part unspecified, without perforation or abscess without bleeding: Secondary | ICD-10-CM | POA: Diagnosis not present

## 2016-06-28 DIAGNOSIS — Z5189 Encounter for other specified aftercare: Secondary | ICD-10-CM | POA: Diagnosis not present

## 2016-06-28 DIAGNOSIS — A419 Sepsis, unspecified organism: Secondary | ICD-10-CM | POA: Diagnosis not present

## 2016-06-28 DIAGNOSIS — I959 Hypotension, unspecified: Secondary | ICD-10-CM | POA: Diagnosis not present

## 2016-06-28 DIAGNOSIS — E118 Type 2 diabetes mellitus with unspecified complications: Secondary | ICD-10-CM

## 2016-06-28 DIAGNOSIS — E785 Hyperlipidemia, unspecified: Secondary | ICD-10-CM | POA: Diagnosis not present

## 2016-06-28 DIAGNOSIS — Z7982 Long term (current) use of aspirin: Secondary | ICD-10-CM | POA: Diagnosis not present

## 2016-06-28 DIAGNOSIS — Z7984 Long term (current) use of oral hypoglycemic drugs: Secondary | ICD-10-CM | POA: Diagnosis not present

## 2016-06-28 LAB — CBC WITH DIFFERENTIAL/PLATELET
BASOS PCT: 0.3 % (ref 0.0–3.0)
Basophils Absolute: 0 10*3/uL (ref 0.0–0.1)
EOS ABS: 0.3 10*3/uL (ref 0.0–0.7)
EOS PCT: 2.8 % (ref 0.0–5.0)
HCT: 40 % (ref 36.0–46.0)
HEMOGLOBIN: 13.5 g/dL (ref 12.0–15.0)
LYMPHS ABS: 1.7 10*3/uL (ref 0.7–4.0)
Lymphocytes Relative: 16.3 % (ref 12.0–46.0)
MCHC: 33.7 g/dL (ref 30.0–36.0)
MCV: 87.8 fl (ref 78.0–100.0)
MONO ABS: 0.6 10*3/uL (ref 0.1–1.0)
Monocytes Relative: 5.7 % (ref 3.0–12.0)
NEUTROS PCT: 74.9 % (ref 43.0–77.0)
Neutro Abs: 7.7 10*3/uL (ref 1.4–7.7)
Platelets: 305 10*3/uL (ref 150.0–400.0)
RBC: 4.56 Mil/uL (ref 3.87–5.11)
RDW: 16.4 % — AB (ref 11.5–15.5)
WBC: 10.3 10*3/uL (ref 4.0–10.5)

## 2016-06-28 LAB — BASIC METABOLIC PANEL
BUN: 8 mg/dL (ref 6–23)
CALCIUM: 9 mg/dL (ref 8.4–10.5)
CO2: 25 mEq/L (ref 19–32)
Chloride: 103 mEq/L (ref 96–112)
Creatinine, Ser: 0.71 mg/dL (ref 0.40–1.20)
GFR: 87 mL/min (ref >60.00)
GLUCOSE: 214 mg/dL — AB (ref 70–99)
POTASSIUM: 3.6 meq/L (ref 3.5–5.1)
SODIUM: 137 meq/L (ref 135–145)

## 2016-06-28 NOTE — Progress Notes (Signed)
06/28/2016  CC:  Chief Complaint  Patient presents with  . Follow-up    hospital    Patient is a 70 y.o. Caucasian female who presents for  hospital follow up, specifically [facility] face-to-face visit. Dates hospitalized: 8/22-8/28, 2017. Days since d/c from hospital: 7 Patient was discharged from hospital to home. Reason for admission to hospital: diverticulitis of sigmoid colon, with sepsis secondary to this. Date of interactive (phone) contact with patient and/or caregiver:06/21/16.  I have reviewed patient's discharge summary plus pertinent specific notes, labs, and imaging from the hospitalization.    Not feeling any abd pain with eating or otherwise.  Her appetite is picking up.  Stools are loose but not watery, having 1-2 per day.  No blood or pus.  She did exercise this morning with HH PT [facility]. No fevers.  Rare nausea, no vomiting. Home glucose monitoring since hospitalization: avg 140s.   Medication reconciliation was done today and patient is taking meds as recommended by discharging hospitalist/specialist.    PMH:  Past Medical History:  Diagnosis Date  . Age-related nuclear cataract of both eyes 2016   +cortical age related cataracts OU   . Allergy    seasonal  . Arthritis    hands  . Bilateral diabetic retinopathy (HCC) 2015   Dr. Zigmund Daniel  . Blood transfusion without reported diagnosis    when taking chemo  . CAD (coronary artery disease)    NON obstructive. Cath 2006 preserved LV fxn, scattered irregularities without critical stenosis. 2008 stress echo negative for ischemia, but with hypertensive response  . COPD (chronic obstructive pulmonary disease) ([place])   . Diabetes mellitus with complication (HCC)    diab retinopathy OU (laser)  . GERD (gastroesophageal reflux disease)    protonix  . History of diverticulitis of colon   . Hyperlipidemia   . Lung cancer ([place])    non-small cell lung ca, stage III in 05/2011; systemic  chemotherapy concurrent with radiation followed by prophylactic cranial irradiation and has been observation since July of 2010 with no evidence for disease recurrence-released from onc f/u 12/2014 (needs annual cxr by PCP).  CXR 08/2015 stable.  . Mild cognitive impairment with memory loss    Likely from brain radiation therapy  . 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 Shirley Muscat, OD  . Osteopenia 03/2016   03/2016 DEXA T-score -2.2    PSH:  Past Surgical History:  Procedure Laterality Date  . ABDOMINAL HYSTERECTOMY  1997  . APPENDECTOMY    . CARPAL TUNNEL RELEASE    . CATARACT EXTRACTION    . CHOLECYSTECTOMY  2002  . COLONOSCOPY  10/24/00   normal.  [employer] hemoccult testing via [place] 06/18/15 was NEG  . LEFT HEART CATHETERIZATION WITH CORONARY ANGIOGRAM N/A 05/30/2012   Procedure: LEFT HEART CATHETERIZATION WITH CORONARY ANGIOGRAM;  Surgeon: Burnell Blanks, MD;  Location: [facility] CATH LAB;  Service: Cardiovascular;  Laterality: N/A;    MEDS:  Outpatient Medications Prior to Visit  Medication Sig Dispense Refill  . albuterol (PROVENTIL) (2.5 MG/3ML) 0.083% nebulizer solution USE ONE VIAL IN NEBULIZER EVERY 4 HOURS AS NEEDED FOR WHEEZING OR SHORTNESS OF BREATH 75 mL 3  . aspirin 81 MG tablet Take 81 mg by mouth at bedtime.     Marland Kitchen aspirin-acetaminophen-caffeine (EXCEDRIN MIGRAINE) 250-250-65 MG per tablet Take 1 tablet by mouth every 6 (six) hours as needed for headache.    Marland Kitchen atorvastatin (LIPITOR) 80 MG tablet Take 1 tablet (80 mg total) by  mouth daily. 90 tablet 1  . FLOVENT HFA 220 MCG/ACT inhaler INHALE TWO PUFFS BY MOUTH TWICE DAILY 12 g 6  . fluticasone (FLONASE) 50 MCG/ACT nasal spray Place 2 sprays into both nostrils at bedtime. (Patient taking differently: Place 2 sprays into both nostrils at bedtime as needed for allergies. ) 16 g 3  . glucose blood (ONE TOUCH ULTRA TEST) test strip TEST 3 TIMES A DAY 100 each 11  . Insulin Isophane & Regular Human (HUMULIN 70/30 KWIKPEN)  (70-30) 100 UNIT/ML PEN Inject 16 units subcutaneously before breakfast and 20 units before supper 15 mL 0  . loratadine (CLARITIN) 10 MG tablet Take 10 mg by mouth at bedtime.     . metFORMIN (GLUCOPHAGE) 500 MG tablet TAKE ONE TABLET BY MOUTH TWICE DAILY WITH MEALS 180 tablet 1  . nitroGLYCERIN (NITROSTAT) 0.4 MG SL tablet Place 1 tablet (0.4 mg total) under the tongue every 5 (five) minutes as needed. For chest pain 50 tablet 3  . ONETOUCH DELICA LANCETS 86P MISC TEST 3 TIMES A DAY 100 each 11  . pantoprazole (PROTONIX) 40 MG tablet TAKE ONE TABLET BY MOUTH ONCE DAILY 30 tablet 12  . prednisoLONE acetate (PRED FORTE) 1 % ophthalmic suspension Place 1 drop into the left eye 4 (four) times daily.    Marland Kitchen PROLENSA 0.07 % SOLN Place 1 drop into the left eye at bedtime.    . saccharomyces boulardii (FLORASTOR) 250 MG capsule Take 1 capsule (250 mg total) by mouth 2 (two) times daily.    . timolol (BETIMOL) 0.5 % ophthalmic solution Place 1 drop into both eyes every morning.    . cholestyramine (QUESTRAN) 4 g packet Take 1 packet (4 g total) by mouth 2 (two) times daily. 10 each 0   No facility-administered medications prior to visit.    EXAM:  BP 107/75 (BP Location: Left Arm, Patient Position: Sitting, Cuff Size: Normal)   Pulse (!) 105   Temp 97.3 F (36.3 C) (Oral)   Resp 16   Ht 5' 1.5" (1.562 m)   Wt 167 lb 6.4 oz (75.9 kg)   SpO2 94%   BMI 31.12 kg/m   Chaperoned by [employer]: alert, pleasant, oriented x 4.  NAD.  Movement up to exam table did elicit some mild SOB and tachycardia but with rest for about 30 sec these normalized. YPP:JKDT: no injection, icteris, swelling, or exudate.  EOMI, PERRLA. Mouth: lips without lesion/swelling.  Oral mucosa pink and moist. Oropharynx without erythema, exudate, or swelling.  CV: Regular, tachy to 105, no m/r/g Chest is clear, no wheezing or rales. Normal symmetric air entry throughout both lung fields. No chest wall deformities or  tenderness. ABD: soft, rotund but nondistended.  BS normal.  No bruit, HSM, or mass.  She is mildly TTP diffusely, without guarding or rebound.  Pertinent labs/imaging Lab Results  Component Value Date   TSH 0.49 07/01/2011   Lab Results  Component Value Date   WBC 5.5 06/19/2016   HGB 11.2 (L) 06/19/2016   HCT 35.8 (L) 06/19/2016   MCV 90.9 06/19/2016   PLT 206 06/19/2016   Lab Results  Component Value Date   CREATININE 0.57 06/20/2016   BUN <5 (L) 06/20/2016   NA 140 06/20/2016   K 3.7 06/20/2016   CL 104 06/20/2016   CO2 24 06/20/2016   Lab Results  Component Value Date   ALT 19 06/14/2016   AST 22 06/14/2016   ALKPHOS 105 06/14/2016   BILITOT  0.9 06/14/2016   Lab Results  Component Value Date   CHOL 213 (H) 07/17/2015   Lab Results  Component Value Date   HDL 32.50 (L) 07/17/2015   Lab Results  Component Value Date   LDLCALC 156 (H) 03/03/2015   Lab Results  Component Value Date   TRIG 212.0 (H) 07/17/2015   Lab Results  Component Value Date   CHOLHDL 7 07/17/2015   Lab Results  Component Value Date   HGBA1C 7.1 (H) 06/14/2016     ASSESSMENT/PLAN:  1) Acute sigmoid diverticulitis with sepsis syndrome. Improved, but still not completely well.   Continue abx, recheck CBC and BMET today. Advance diet as tolerated. Continue HH PT.  2) DM 2: stable.   Most recent A1c while in hosp was 7.1%. Continue current mgmt. Rechecking renal function today.  Medical decision making of moderate complexity was utilized today.  FOLLOW UP:  Keep appt already arranged for next week.  An After Visit Summary was printed and given to the patient.  Signed:  Crissie Sickles, MD           06/28/2016

## 2016-06-28 NOTE — Patient Outreach (Addendum)
Transition of care call #2. All initial assessments are done except asking about her religion and culture which I will do next week.  Fall Risk  06/28/2016 04/06/2015 04/06/2015 10/04/2013  Falls in the past year? Yes Yes Yes Yes  Number falls in past yr: '1 1 1 1  '$ Injury with Fall? Yes No No Yes  Risk Factor Category  High Fall Risk - - -  Risk for fall due to : History of fall(s) - - -  Follow up Falls evaluation completed Falls prevention discussed - -   Depression screen [facility] 2/9 06/28/2016 04/06/2015 04/06/2015 10/04/2013  Decreased Interest 0 0 0 0  Down, Depressed, Hopeless 0 0 0 0  PHQ - 2 Score 0 0 0 0   [facility] CM Care Plan Problem One   Flowsheet Row Most Recent Value  Care Plan Problem One  New Diagnosis: Diverticulitis  Role Documenting the Problem One  Care Management [place] for Problem One  Active  THN CM Short Term Goal #1 (0-30 days)  Pt will take all her medications as ordered per pt report at the end of 30 days.  [facility]N CM Short Term Goal #1 Start Date  06/28/16  Interventions for Short Term Goal #1  Will send pt Emmi programs of diverticulitis.  THN CM Short Term Goal #2 (0-30 days)  Pt will attend her next MD appt on 07/05/16.  THN CM Short Term Goal #2 Start Date  06/28/16  Interventions for Short Term Goal #2  Encouraged pt to follow up with MD as scheduled.    [facility] CM Care Plan Problem Two   Flowsheet Row Most Recent Value  Care Plan Problem Two  No Advanced Directives  Role Documenting the Problem Two  Care Management Coordinator  Care Plan for Problem Two  Active  Interventions for Problem Two Long Term Goal   Discussed how important it is to have her wishes known and in writing to assure her wishes are followed.  THN Long Term Goal (31-90) days  Pt will complete her HCPOA, Living will and MOST form during the next 31 days.  THN Long Term Goal Start Date  06/28/16      [name] [facility] [place] [phone]

## 2016-06-28 NOTE — Telephone Encounter (Signed)
Noted. Pt is not on any antihypertensives or diuretics at this time. I agree that pt should be using her albuterol q4h prn SOB/wheezing.

## 2016-06-28 NOTE — Telephone Encounter (Signed)
Was seen by pt by physical therapy. He did a little exercise going to mailbox was informed by patient she had chest pain and sob lasting 30 sec. Was assets and asked if she was taking her medication albuterol. Patient stated she is not taking it because of the side effects so PT strongly advised for her to be taking it. Also PT notice her blood pressure drop from sitting to standing or vise versa and wanted PCP to be aware of her situation.

## 2016-06-28 NOTE — Telephone Encounter (Signed)
Herbert Deaner, therapist with [facility] calling to report patient complained of moderate chest pain today  lasting approximately 30 seconds when walking down driveway to check mailbox.  Vitals taken 10 minutes later and were within normal limits.  Patient not taking meds for shortness of breath or wheezing.  Patient education provided on importance of taking medications.  Patient was observed to have sob and wheezing on moderate exertion.

## 2016-06-29 DIAGNOSIS — E1165 Type 2 diabetes mellitus with hyperglycemia: Secondary | ICD-10-CM | POA: Diagnosis not present

## 2016-06-29 DIAGNOSIS — E785 Hyperlipidemia, unspecified: Secondary | ICD-10-CM | POA: Diagnosis not present

## 2016-06-29 DIAGNOSIS — I959 Hypotension, unspecified: Secondary | ICD-10-CM | POA: Diagnosis not present

## 2016-06-29 DIAGNOSIS — Z794 Long term (current) use of insulin: Secondary | ICD-10-CM | POA: Diagnosis not present

## 2016-06-29 DIAGNOSIS — C3490 Malignant neoplasm of unspecified part of unspecified bronchus or lung: Secondary | ICD-10-CM | POA: Diagnosis not present

## 2016-06-29 DIAGNOSIS — Z7984 Long term (current) use of oral hypoglycemic drugs: Secondary | ICD-10-CM | POA: Diagnosis not present

## 2016-06-29 DIAGNOSIS — I2510 Atherosclerotic heart disease of native coronary artery without angina pectoris: Secondary | ICD-10-CM | POA: Diagnosis not present

## 2016-06-29 DIAGNOSIS — K219 Gastro-esophageal reflux disease without esophagitis: Secondary | ICD-10-CM | POA: Diagnosis not present

## 2016-06-29 DIAGNOSIS — A419 Sepsis, unspecified organism: Secondary | ICD-10-CM | POA: Diagnosis not present

## 2016-06-29 DIAGNOSIS — K5732 Diverticulitis of large intestine without perforation or abscess without bleeding: Secondary | ICD-10-CM | POA: Diagnosis not present

## 2016-06-29 DIAGNOSIS — Z7982 Long term (current) use of aspirin: Secondary | ICD-10-CM | POA: Diagnosis not present

## 2016-06-30 ENCOUNTER — Telehealth: Payer: Self-pay | Admitting: Family Medicine

## 2016-06-30 DIAGNOSIS — Z79899 Other long term (current) drug therapy: Secondary | ICD-10-CM

## 2016-06-30 DIAGNOSIS — J438 Other emphysema: Secondary | ICD-10-CM

## 2016-06-30 NOTE — Telephone Encounter (Signed)
You will need to place an order please.

## 2016-06-30 NOTE — Telephone Encounter (Signed)
OK, order place for [facility] with [facility].

## 2016-06-30 NOTE — Telephone Encounter (Signed)
OK. Can Clair Gulling take a verbal order for these things or do I need to do an order on the EMR?  Is he with [facility]?

## 2016-06-30 NOTE — Telephone Encounter (Signed)
Clair Gulling, PT called today stating that patient canceled PT today stating she was "sick" and gave no reasoning.  Clair Gulling did over hear something about elevated BS.  He isn't scheduled to go back until next week and OT is scheduled for tomorrow.  Clair Gulling believes patient would benefit from skilled nursing due to the following reasons: Daily SOB/wheezing and doesn't use meds accordingly New medications - make sure patient is compliant / using correctly. Pt had chest pain with moderate exertion that resolved with 30 seconds of rest.    Please advise.

## 2016-07-01 DIAGNOSIS — E785 Hyperlipidemia, unspecified: Secondary | ICD-10-CM | POA: Diagnosis not present

## 2016-07-01 DIAGNOSIS — Z794 Long term (current) use of insulin: Secondary | ICD-10-CM | POA: Diagnosis not present

## 2016-07-01 DIAGNOSIS — I959 Hypotension, unspecified: Secondary | ICD-10-CM | POA: Diagnosis not present

## 2016-07-01 DIAGNOSIS — Z7984 Long term (current) use of oral hypoglycemic drugs: Secondary | ICD-10-CM | POA: Diagnosis not present

## 2016-07-01 DIAGNOSIS — E1165 Type 2 diabetes mellitus with hyperglycemia: Secondary | ICD-10-CM | POA: Diagnosis not present

## 2016-07-01 DIAGNOSIS — K5732 Diverticulitis of large intestine without perforation or abscess without bleeding: Secondary | ICD-10-CM | POA: Diagnosis not present

## 2016-07-01 DIAGNOSIS — K219 Gastro-esophageal reflux disease without esophagitis: Secondary | ICD-10-CM | POA: Diagnosis not present

## 2016-07-01 DIAGNOSIS — Z7982 Long term (current) use of aspirin: Secondary | ICD-10-CM | POA: Diagnosis not present

## 2016-07-01 DIAGNOSIS — A419 Sepsis, unspecified organism: Secondary | ICD-10-CM | POA: Diagnosis not present

## 2016-07-01 DIAGNOSIS — C3490 Malignant neoplasm of unspecified part of unspecified bronchus or lung: Secondary | ICD-10-CM | POA: Diagnosis not present

## 2016-07-01 DIAGNOSIS — I2510 Atherosclerotic heart disease of native coronary artery without angina pectoris: Secondary | ICD-10-CM | POA: Diagnosis not present

## 2016-07-01 NOTE — Telephone Encounter (Signed)
[facility] is aware.

## 2016-07-04 DIAGNOSIS — E1165 Type 2 diabetes mellitus with hyperglycemia: Secondary | ICD-10-CM | POA: Diagnosis not present

## 2016-07-04 DIAGNOSIS — I959 Hypotension, unspecified: Secondary | ICD-10-CM | POA: Diagnosis not present

## 2016-07-04 DIAGNOSIS — Z7984 Long term (current) use of oral hypoglycemic drugs: Secondary | ICD-10-CM | POA: Diagnosis not present

## 2016-07-04 DIAGNOSIS — K219 Gastro-esophageal reflux disease without esophagitis: Secondary | ICD-10-CM | POA: Diagnosis not present

## 2016-07-04 DIAGNOSIS — C3490 Malignant neoplasm of unspecified part of unspecified bronchus or lung: Secondary | ICD-10-CM | POA: Diagnosis not present

## 2016-07-04 DIAGNOSIS — Z7982 Long term (current) use of aspirin: Secondary | ICD-10-CM | POA: Diagnosis not present

## 2016-07-04 DIAGNOSIS — I2510 Atherosclerotic heart disease of native coronary artery without angina pectoris: Secondary | ICD-10-CM | POA: Diagnosis not present

## 2016-07-04 DIAGNOSIS — K5732 Diverticulitis of large intestine without perforation or abscess without bleeding: Secondary | ICD-10-CM | POA: Diagnosis not present

## 2016-07-04 DIAGNOSIS — E785 Hyperlipidemia, unspecified: Secondary | ICD-10-CM | POA: Diagnosis not present

## 2016-07-04 DIAGNOSIS — Z794 Long term (current) use of insulin: Secondary | ICD-10-CM | POA: Diagnosis not present

## 2016-07-04 DIAGNOSIS — A419 Sepsis, unspecified organism: Secondary | ICD-10-CM | POA: Diagnosis not present

## 2016-07-04 NOTE — Progress Notes (Signed)
Subjective:   [name] is a 70 y.o. female who presents for Medicare Annual (Subsequent) preventive examination.  Review of Systems:  No ROS.  Medicare Wellness Visit.  Cardiac Risk Factors include: advanced age (>38mn, >>65women);diabetes mellitus;dyslipidemia;sedentary lifestyle;obesity (BMI >30kg/m2) Sleep patterns:Sleeps about 7 hours, up to bathroom x 1.     Home Safety/Smoke Alarms:  Lives with husband, daughter and grandson. Smoke detectors in place. Living environment; residence and Firearm Safety:  Firearms locked up.  Seat Belt Safety/Bike Helmet: Wears seatbelt.   Counseling:   Eye Exam-Followed yearly by optometry Dental-Full dentures. No problems identified.   Female:   Pap-N/A       Mammo-03/07/16-Normal. Recall 1 year.        Dexa scan-04/06/16-Osteopenic. Recall 2 years.       CCS-Colonoscopy 10/02/15-Benign Polyps. Recall 5 years.       Objective:     Vitals: BP 120/70 (BP Location: Left Arm, Patient Position: Sitting)   Pulse (!) 102   Ht '5\' 2"'$  (1.575 m)   Wt 164 lb 12.8 oz (74.8 kg)   SpO2 97%   BMI 30.14 kg/m   Body mass index is 30.14 kg/m.   Tobacco History  Smoking Status  . Former Smoker  . Packs/day: 1.00  . Years: 45.00  . Types: Cigarettes  . Quit date: 10/24/2008  Smokeless Tobacco  . Never Used     Counseling given: Not Answered   Past Medical History:  Diagnosis Date  . Age-related nuclear cataract of both eyes 2016   +cortical age related cataracts OU   . Allergy    seasonal  . Arthritis    hands  . Bilateral diabetic retinopathy (HCC) 2015   Dr. MZigmund Daniel . Blood transfusion without reported diagnosis    when taking chemo  . CAD (coronary artery disease)    NON obstructive. Cath 2006 preserved LV fxn, scattered irregularities without critical stenosis. 2008 stress echo negative for ischemia, but with hypertensive response  . [place]   . Diabetes mellitus with  complication (HCC)    diab retinopathy OU (laser)  . Diverticulitis 05/2016  . GERD (gastroesophageal reflux disease)    protonix  . History of diverticulitis of colon   . Hyperlipidemia   . Lung cancer ([facility]    non-small cell lung ca, stage III in 05/2011; systemic chemotherapy concurrent with radiation followed by prophylactic cranial irradiation and has been observation since July of 2010 with no evidence for disease recurrence-released from onc f/u 12/2014 (needs annual cxr by PCP).  CXR 08/2015 stable.  . Mild cognitive impairment with memory loss    Likely from brain radiation therapy  . 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 BShirley Muscat OD  . Osteopenia 03/2016   03/2016 DEXA T-score -2.2   Past Surgical History:  Procedure Laterality Date  . ABDOMINAL HYSTERECTOMY  1997  . APPENDECTOMY    . CARPAL TUNNEL RELEASE    . CATARACT EXTRACTION    . CHOLECYSTECTOMY  2002  . COLONOSCOPY  10/24/00   normal.  [facility] hemoccult testing via [place]8/25/16 was NEG  . LEFT HEART CATHETERIZATION WITH CORONARY ANGIOGRAM N/A 05/30/2012   Procedure: LEFT HEART CATHETERIZATION WITH CORONARY ANGIOGRAM;  Surgeon: CBurnell Blanks MD;  [facility];  Service: Cardiovascular;  Laterality: N/A;   Family History  Problem Relation Age of Onset  . Heart failure Mother   . Kidney disease Mother     renal  failure  . Diabetes Mother   . Diabetes Father   . Diabetes Brother   . Heart disease Brother   . Heart attack Brother   . Heart attack Brother   . Heart attack Brother   . Colon cancer Neg Hx    History  Sexual Activity  . Sexual activity: Not on file    Outpatient Encounter Prescriptions as of 07/05/2016  Medication Sig  . albuterol (PROVENTIL) (2.5 MG/3ML) 0.083% nebulizer solution USE ONE VIAL IN NEBULIZER EVERY 4 HOURS AS NEEDED FOR WHEEZING OR SHORTNESS OF BREATH  . amoxicillin-clavulanate (AUGMENTIN) 875-125 MG tablet Take 1 tablet by mouth 2 (two) times daily.  Marland Kitchen aspirin  81 MG tablet Take 81 mg by mouth at bedtime.   Marland Kitchen aspirin-acetaminophen-caffeine (EXCEDRIN MIGRAINE) 250-250-65 MG per tablet Take 1 tablet by mouth every 6 (six) hours as needed for headache.  Marland Kitchen atorvastatin (LIPITOR) 80 MG tablet Take 1 tablet (80 mg total) by mouth daily.  Marland Kitchen FLOVENT HFA 220 MCG/ACT inhaler INHALE TWO PUFFS BY MOUTH TWICE DAILY  . fluticasone (FLONASE) 50 MCG/ACT nasal spray Place 2 sprays into both nostrils at bedtime. (Patient taking differently: Place 2 sprays into both nostrils at bedtime as needed for allergies. )  . glucose blood (ONE TOUCH ULTRA TEST) test strip TEST 3 TIMES A DAY  . Insulin Isophane & Regular Human (HUMULIN 70/30 KWIKPEN) (70-30) 100 UNIT/ML PEN Inject 16 units subcutaneously before breakfast and 20 units before supper  . loratadine (CLARITIN) 10 MG tablet Take 10 mg by mouth at bedtime.   . metFORMIN (GLUCOPHAGE) 500 MG tablet TAKE ONE TABLET BY MOUTH TWICE DAILY WITH MEALS  . ONETOUCH DELICA LANCETS 29H MISC TEST 3 TIMES A DAY  . pantoprazole (PROTONIX) 40 MG tablet TAKE ONE TABLET BY MOUTH ONCE DAILY  . prednisoLONE acetate (PRED FORTE) 1 % ophthalmic suspension Place 1 drop into the left eye 4 (four) times daily.  Marland Kitchen PROLENSA 0.07 % SOLN Place 1 drop into the left eye at bedtime.  . saccharomyces boulardii (FLORASTOR) 250 MG capsule Take 1 capsule (250 mg total) by mouth 2 (two) times daily.  . timolol (BETIMOL) 0.5 % ophthalmic solution Place 1 drop into both eyes every morning.  . cholestyramine (QUESTRAN) 4 g packet Take 1 packet (4 g total) by mouth 2 (two) times daily.  . nitroGLYCERIN (NITROSTAT) 0.4 MG SL tablet Place 1 tablet (0.4 mg total) under the tongue every 5 (five) minutes as needed. For chest pain (Patient not taking: Reported on 06/28/2016)   No facility-administered encounter medications on file as of 07/05/2016.     Activities of Daily Living In your present state of health, do you have any difficulty performing the following  activities: 07/05/2016 06/28/2016  Hearing? N N  Vision? N N  Difficulty concentrating or making decisions? N N  Walking or climbing stairs? N Y  Dressing or bathing? N N  Doing errands, shopping? N Y  Conservation officer, nature and eating ? N Y  Using the Toilet? N Y  In the past six months, have you accidently leaked urine? N Y  Do you have problems with loss of bowel control? N Y  Managing your Medications? Y N  Managing your Finances? [name]  Housekeeping or managing your Housekeeping? N Y  Some recent data might be hidden    Patient Care Team: [name], MD as PCP - General (Family Medicine) Rigoberto Noel, MD as Consulting Physician (Pulmonary Disease) Calton Dach, MD as  Consulting Physician (Optometry) Curt Bears, MD as Consulting Physician (Oncology) Hayden Pedro, MD as Consulting Physician (Ophthalmology) [name], NP as [place] Management    Assessment:    Physical assessment deferred to PCP.  Exercise Activities and Dietary recommendations Current Exercise Habits: Home exercise routine, Type of exercise: Other - see comments (PT visits home twice weekly ), Frequency (Times/Week): 2, Exercise limited by: cardiac condition(s);orthopedic condition(s);respiratory conditions(s) Diet (meal preparation, eat out, water intake, caffeinated beverages, dairy products, fruits and vegetables): Drinks 3 cups of coffee, Diet Pepsi, Unsweet tea. Typically eats 2 meals a day. Encouraged to eat at least 3 meals a day.  Breakfast: Cereal and oatmeal Lunch: sandwiches Dinner:Spaghetti, hamburger steak, lasagna   Goals    None     Fall Risk Fall Risk  07/05/2016 06/28/2016 04/06/2015 04/06/2015 10/04/2013  Falls in the past year? Yes Yes Yes Yes Yes  Number falls in past yr: '1 1 1 1 1  '$ Injury with Fall? No Yes No No Yes  Risk Factor Category  - High Fall Risk - - -  Risk for fall due to : History of fall(s) History of fall(s) - - -  Follow up Falls  prevention discussed Falls evaluation completed Falls prevention discussed - -   Depression Screen PHQ 2/9 Scores 07/05/2016 06/28/2016 04/06/2015 04/06/2015  PHQ - 2 Score 0 0 0 0     Cognitive Testing MMSE - Mini Mental State Exam 07/05/2016  Orientation to time 2  Orientation to Place 5  Registration 3  Attention/ Calculation 5  Recall 3  Language- name 2 objects 2  Language- repeat 1  Language- follow 3 step command 3  Language- read & follow direction 1  Write a sentence 1  Copy design 1  Total score 27    Immunization History  Administered Date(s) Administered  . Influenza Split 07/06/2012  . Influenza Whole 07/24/2006, 08/06/2007, 07/18/2008, 07/29/2009, 08/25/2010, 07/01/2011  . Influenza, High Dose Seasonal PF 07/03/2015  . Influenza,inj,Quad PF,36+ Mos 07/18/2013, 08/21/2014  . Pneumococcal Conjugate-13 02/05/2011  . Pneumococcal Polysaccharide-23 07/18/2008, 05/13/2014  . Zoster 04/07/2015   Screening Tests Health Maintenance  Topic Date Due  . URINE MICROALBUMIN  03/02/2016  . INFLUENZA VACCINE  05/24/2016  . FOOT EXAM  07/16/2016  . HEMOGLOBIN A1C  12/15/2016  . OPHTHALMOLOGY EXAM  01/05/2017  . COLONOSCOPY  10/01/2020  . DEXA SCAN  Completed  . ZOSTAVAX  Completed  . Hepatitis C Screening  Completed  . PNA vac Low Risk Adult  Completed      Plan:     Continue to eat heart healthy diet (full of fruits, vegetables, whole grains, lean protein, water--limit salt, fat, and sugar intake) and increase physical activity as tolerated.  Continue doing brain stimulating activities (puzzles, reading, adult coloring books, staying active) to keep memory sharp.   Have daughter and husband review medication list and bring with you at next appointment.   Bring copy of advanced directives (once complete).   During the course of the visit the patient was educated and counseled about the following appropriate screening and preventive services:   Vaccines to include  Pneumoccal, Influenza, Hepatitis B, Td, Zostavax  Cardiovascular Disease  Colorectal cancer screening  Bone density screening  Diabetes screening  Glaucoma screening  Mammography/PAP  Nutrition counseling   Patient Instructions (the written plan) was given to the patient.   Gerilyn Nestle, RN  07/05/2016

## 2016-07-05 ENCOUNTER — Other Ambulatory Visit: Payer: Self-pay | Admitting: *Deleted

## 2016-07-05 ENCOUNTER — Ambulatory Visit (INDEPENDENT_AMBULATORY_CARE_PROVIDER_SITE_OTHER): Payer: Medicare Other

## 2016-07-05 ENCOUNTER — Telehealth: Payer: Self-pay | Admitting: Family Medicine

## 2016-07-05 ENCOUNTER — Ambulatory Visit: Payer: Self-pay

## 2016-07-05 ENCOUNTER — Ambulatory Visit: Payer: Medicare Other

## 2016-07-05 VITALS — BP 120/70 | HR 102 | Ht 62.0 in | Wt 164.8 lb

## 2016-07-05 DIAGNOSIS — Z0000 Encounter for general adult medical examination without abnormal findings: Secondary | ICD-10-CM

## 2016-07-05 DIAGNOSIS — Z23 Encounter for immunization: Secondary | ICD-10-CM | POA: Diagnosis not present

## 2016-07-05 NOTE — Patient Instructions (Signed)
Continue to eat heart healthy diet (full of fruits, vegetables, whole grains, lean protein, water--limit salt, fat, and sugar intake) and increase physical activity as tolerated.  Continue doing brain stimulating activities (puzzles, reading, adult coloring books, staying active) to keep memory sharp.   Have daughter and husband review medication list and bring with you at next appointment.

## 2016-07-05 NOTE — Telephone Encounter (Signed)
Juliann Pulse with [facility] calling to report they went to see patient.  However, they are unable to treat patient due to [facility] also seeing the patient.    After reviewing the chart I informed Juliann Pulse, the patient is being seen by [facility] for PT & OT only and not skilled nursing.  [facility] had previously informed us they are unable to see patient for skilled nursing.   Juliann Pulse states, [facility] needs to see the patient for skilled nursing as well because [facility] policy does not permit them to see a patient when another home health agency is currently seeing the patient even if it is for a different service.

## 2016-07-05 NOTE — Patient Outreach (Signed)
[name] reports she is doing well today. She did have a set back over the weekend with a day of nausea and vomiting but this has resolved. She reports she doesn't know about a diet to follow or what things to avoid for diverticulitis. I shared she should not eat food that is in kernals for example corn, popcorn, or hard food like nuts, food with lots of seeds. I will send her an Emmi program on diverticulitis. I have asked her to call me if she has any questions. I reminded her that I would be calling next week.  [name] [facility] [place] [phone]

## 2016-07-06 DIAGNOSIS — K219 Gastro-esophageal reflux disease without esophagitis: Secondary | ICD-10-CM | POA: Diagnosis not present

## 2016-07-06 DIAGNOSIS — Z7982 Long term (current) use of aspirin: Secondary | ICD-10-CM | POA: Diagnosis not present

## 2016-07-06 DIAGNOSIS — K5732 Diverticulitis of large intestine without perforation or abscess without bleeding: Secondary | ICD-10-CM | POA: Diagnosis not present

## 2016-07-06 DIAGNOSIS — C3490 Malignant neoplasm of unspecified part of unspecified bronchus or lung: Secondary | ICD-10-CM | POA: Diagnosis not present

## 2016-07-06 DIAGNOSIS — I959 Hypotension, unspecified: Secondary | ICD-10-CM | POA: Diagnosis not present

## 2016-07-06 DIAGNOSIS — E785 Hyperlipidemia, unspecified: Secondary | ICD-10-CM | POA: Diagnosis not present

## 2016-07-06 DIAGNOSIS — I2510 Atherosclerotic heart disease of native coronary artery without angina pectoris: Secondary | ICD-10-CM | POA: Diagnosis not present

## 2016-07-06 DIAGNOSIS — Z7984 Long term (current) use of oral hypoglycemic drugs: Secondary | ICD-10-CM | POA: Diagnosis not present

## 2016-07-06 DIAGNOSIS — A419 Sepsis, unspecified organism: Secondary | ICD-10-CM | POA: Diagnosis not present

## 2016-07-06 DIAGNOSIS — E1165 Type 2 diabetes mellitus with hyperglycemia: Secondary | ICD-10-CM | POA: Diagnosis not present

## 2016-07-06 DIAGNOSIS — Z794 Long term (current) use of insulin: Secondary | ICD-10-CM | POA: Diagnosis not present

## 2016-07-06 NOTE — Telephone Encounter (Signed)
Melissa from [facility] contacted me and stated that she is sorry for the miscommunication.  This patient can actually be seen for skilled nursing by [facility] since she is getting OT and PT through [facility].  Pt is being taken care of and has already received her first skilled nursing visit.

## 2016-07-06 NOTE — Telephone Encounter (Signed)
Sent a staff message to [facility] with [facility] to see if she can help.

## 2016-07-08 ENCOUNTER — Telehealth: Payer: Self-pay | Admitting: *Deleted

## 2016-07-08 ENCOUNTER — Encounter: Payer: Self-pay | Admitting: Family Medicine

## 2016-07-08 DIAGNOSIS — I2510 Atherosclerotic heart disease of native coronary artery without angina pectoris: Secondary | ICD-10-CM | POA: Diagnosis not present

## 2016-07-08 DIAGNOSIS — K219 Gastro-esophageal reflux disease without esophagitis: Secondary | ICD-10-CM | POA: Diagnosis not present

## 2016-07-08 DIAGNOSIS — Z7982 Long term (current) use of aspirin: Secondary | ICD-10-CM | POA: Diagnosis not present

## 2016-07-08 DIAGNOSIS — I959 Hypotension, unspecified: Secondary | ICD-10-CM | POA: Diagnosis not present

## 2016-07-08 DIAGNOSIS — E1165 Type 2 diabetes mellitus with hyperglycemia: Secondary | ICD-10-CM | POA: Diagnosis not present

## 2016-07-08 DIAGNOSIS — K5732 Diverticulitis of large intestine without perforation or abscess without bleeding: Secondary | ICD-10-CM | POA: Diagnosis not present

## 2016-07-08 DIAGNOSIS — R2681 Unsteadiness on feet: Secondary | ICD-10-CM | POA: Diagnosis not present

## 2016-07-08 DIAGNOSIS — Z7984 Long term (current) use of oral hypoglycemic drugs: Secondary | ICD-10-CM | POA: Diagnosis not present

## 2016-07-08 DIAGNOSIS — A419 Sepsis, unspecified organism: Secondary | ICD-10-CM | POA: Diagnosis not present

## 2016-07-08 DIAGNOSIS — Z794 Long term (current) use of insulin: Secondary | ICD-10-CM | POA: Diagnosis not present

## 2016-07-08 DIAGNOSIS — E785 Hyperlipidemia, unspecified: Secondary | ICD-10-CM | POA: Diagnosis not present

## 2016-07-08 DIAGNOSIS — C3490 Malignant neoplasm of unspecified part of unspecified bronchus or lung: Secondary | ICD-10-CM | POA: Diagnosis not present

## 2016-07-08 NOTE — Telephone Encounter (Signed)
Rx for walker faxed to [facility].

## 2016-07-08 NOTE — Telephone Encounter (Signed)
[facility] called and left message stating they are recommending a 4 wheel walker with seat to help patient with ambulation.They would like a  Rx faxed to them at [phone]. Please advise

## 2016-07-08 NOTE — Telephone Encounter (Signed)
Rx written.

## 2016-07-11 ENCOUNTER — Telehealth: Payer: Self-pay | Admitting: Family Medicine

## 2016-07-11 DIAGNOSIS — Z7984 Long term (current) use of oral hypoglycemic drugs: Secondary | ICD-10-CM | POA: Diagnosis not present

## 2016-07-11 DIAGNOSIS — K219 Gastro-esophageal reflux disease without esophagitis: Secondary | ICD-10-CM | POA: Diagnosis not present

## 2016-07-11 DIAGNOSIS — I2510 Atherosclerotic heart disease of native coronary artery without angina pectoris: Secondary | ICD-10-CM | POA: Diagnosis not present

## 2016-07-11 DIAGNOSIS — Z7982 Long term (current) use of aspirin: Secondary | ICD-10-CM | POA: Diagnosis not present

## 2016-07-11 DIAGNOSIS — C3490 Malignant neoplasm of unspecified part of unspecified bronchus or lung: Secondary | ICD-10-CM | POA: Diagnosis not present

## 2016-07-11 DIAGNOSIS — K5732 Diverticulitis of large intestine without perforation or abscess without bleeding: Secondary | ICD-10-CM | POA: Diagnosis not present

## 2016-07-11 DIAGNOSIS — I959 Hypotension, unspecified: Secondary | ICD-10-CM | POA: Diagnosis not present

## 2016-07-11 DIAGNOSIS — Z794 Long term (current) use of insulin: Secondary | ICD-10-CM | POA: Diagnosis not present

## 2016-07-11 DIAGNOSIS — E785 Hyperlipidemia, unspecified: Secondary | ICD-10-CM | POA: Diagnosis not present

## 2016-07-11 DIAGNOSIS — E1165 Type 2 diabetes mellitus with hyperglycemia: Secondary | ICD-10-CM | POA: Diagnosis not present

## 2016-07-11 DIAGNOSIS — A419 Sepsis, unspecified organism: Secondary | ICD-10-CM | POA: Diagnosis not present

## 2016-07-11 NOTE — Telephone Encounter (Signed)
Left detailed message on Jim Hoffman's cell.  States it is okay to leave confidential VM's.

## 2016-07-11 NOTE — Telephone Encounter (Signed)
Pt's PT is requesting additional visits to help patient with walker that she just received.  He would like to do 1 visit per week x 3 weeks.  Okay for verbal?

## 2016-07-11 NOTE — Telephone Encounter (Signed)
Yes, ok for verbal

## 2016-07-12 ENCOUNTER — Other Ambulatory Visit: Payer: Self-pay | Admitting: *Deleted

## 2016-07-12 ENCOUNTER — Telehealth: Payer: Self-pay

## 2016-07-12 NOTE — Telephone Encounter (Signed)
OK to d/c questran and probiotic.-thx

## 2016-07-12 NOTE — Patient Outreach (Signed)
Transition of care call. Pt did not answer the phone. I left a message for her to please return my call.  [name] [facility] [place] [phone]

## 2016-07-12 NOTE — Telephone Encounter (Signed)
Home health notified of instructions to d/c medication.

## 2016-07-12 NOTE — Telephone Encounter (Signed)
[name] [facility] called stating that [name] was prescribed [name] and a probiotic from the hospital but is not taking. Questrin caused nausea. Patient is doing good per Home Care.  Glucose 102. Please advise.

## 2016-07-14 DIAGNOSIS — E1165 Type 2 diabetes mellitus with hyperglycemia: Secondary | ICD-10-CM | POA: Diagnosis not present

## 2016-07-14 DIAGNOSIS — I2510 Atherosclerotic heart disease of native coronary artery without angina pectoris: Secondary | ICD-10-CM | POA: Diagnosis not present

## 2016-07-14 DIAGNOSIS — A419 Sepsis, unspecified organism: Secondary | ICD-10-CM | POA: Diagnosis not present

## 2016-07-14 DIAGNOSIS — Z7984 Long term (current) use of oral hypoglycemic drugs: Secondary | ICD-10-CM | POA: Diagnosis not present

## 2016-07-14 DIAGNOSIS — Z7982 Long term (current) use of aspirin: Secondary | ICD-10-CM | POA: Diagnosis not present

## 2016-07-14 DIAGNOSIS — K219 Gastro-esophageal reflux disease without esophagitis: Secondary | ICD-10-CM | POA: Diagnosis not present

## 2016-07-14 DIAGNOSIS — K5732 Diverticulitis of large intestine without perforation or abscess without bleeding: Secondary | ICD-10-CM | POA: Diagnosis not present

## 2016-07-14 DIAGNOSIS — Z794 Long term (current) use of insulin: Secondary | ICD-10-CM | POA: Diagnosis not present

## 2016-07-14 DIAGNOSIS — I959 Hypotension, unspecified: Secondary | ICD-10-CM | POA: Diagnosis not present

## 2016-07-14 DIAGNOSIS — E785 Hyperlipidemia, unspecified: Secondary | ICD-10-CM | POA: Diagnosis not present

## 2016-07-14 DIAGNOSIS — C3490 Malignant neoplasm of unspecified part of unspecified bronchus or lung: Secondary | ICD-10-CM | POA: Diagnosis not present

## 2016-07-16 ENCOUNTER — Other Ambulatory Visit: Payer: Self-pay | Admitting: Family Medicine

## 2016-07-16 DIAGNOSIS — E782 Mixed hyperlipidemia: Secondary | ICD-10-CM

## 2016-07-16 DIAGNOSIS — E785 Hyperlipidemia, unspecified: Secondary | ICD-10-CM

## 2016-07-17 IMAGING — CT CT ANGIO CHEST
2 of 6 series · 17 of 36 positions shown · IV contrast (APPLIED)
Comparison: Chest CT 01/15/2015.

CLINICAL DATA: 69-year-old female with history of non-small cell
lung cancer status post chemotherapy and radiation therapy
presenting with several day history of chest pain, cough and
congestion.

EXAM:
CT ANGIOGRAPHY CHEST WITH CONTRAST
TECHNIQUE: Multidetector CT imaging of the chest was performed using the
standard protocol during bolus administration of intravenous
contrast. Multiplanar CT image reconstructions and MIPs were
obtained to evaluate the vascular anatomy.
CONTRAST:  100mL OMNIPAQUE IOHEXOL 350 MG/ML SOLN

[Series 5: pe 1.0 b26f · axial · 0.58mm/px · z∈[-265,-21]mm · 16 of 272 slices shown]
[im 14/272  lung]
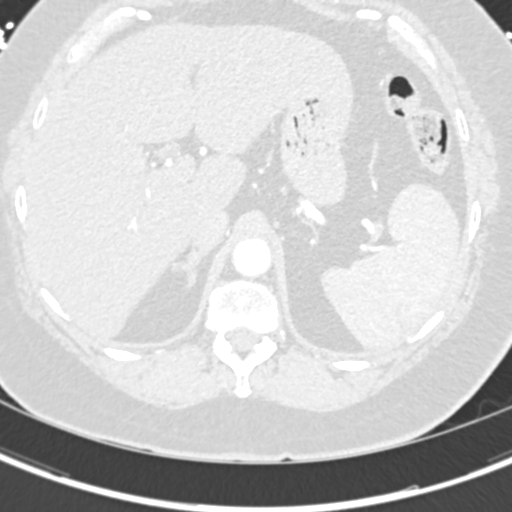
[im 28/272  mediastinal]
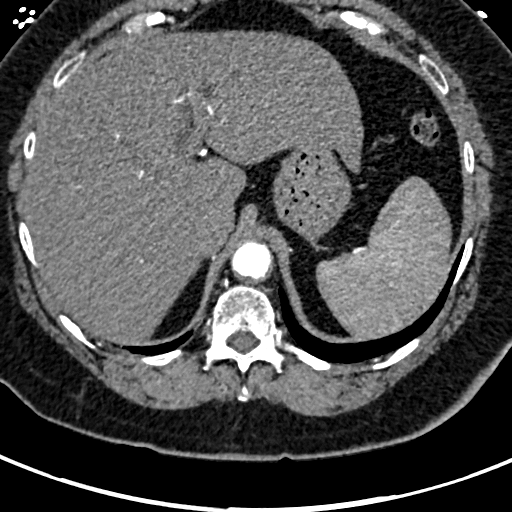
[im 41/272  lung]
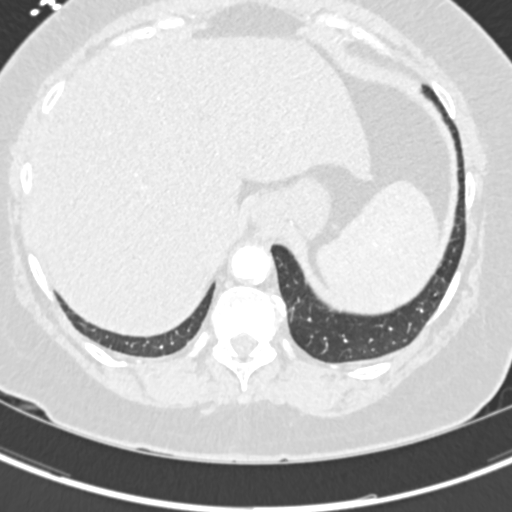
[im 68/272  mediastinal]
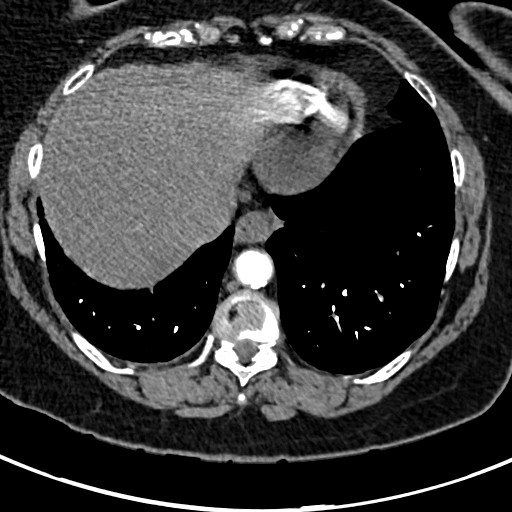
[im 82/272  lung]
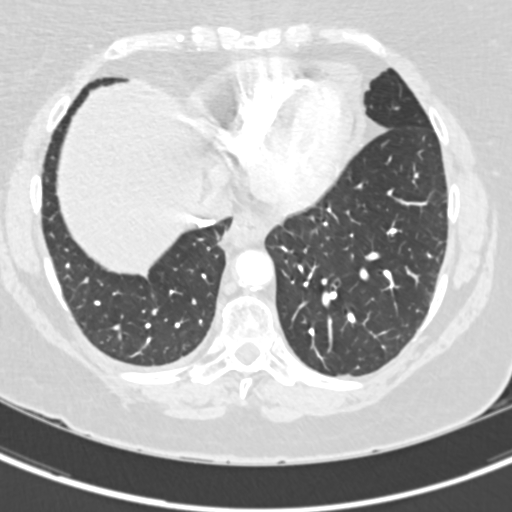
[im 95/272  mediastinal]
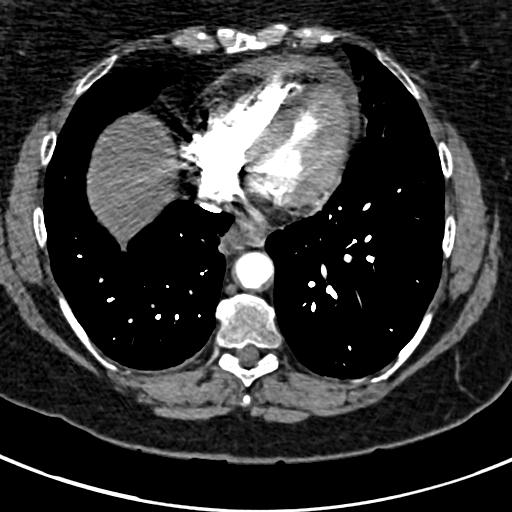
[im 109/272  lung]
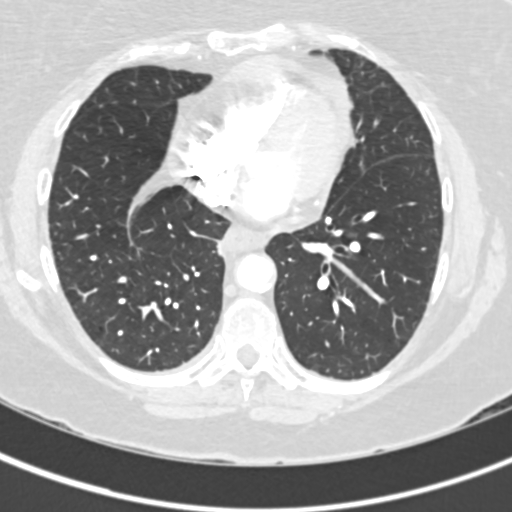
[im 122/272  mediastinal]
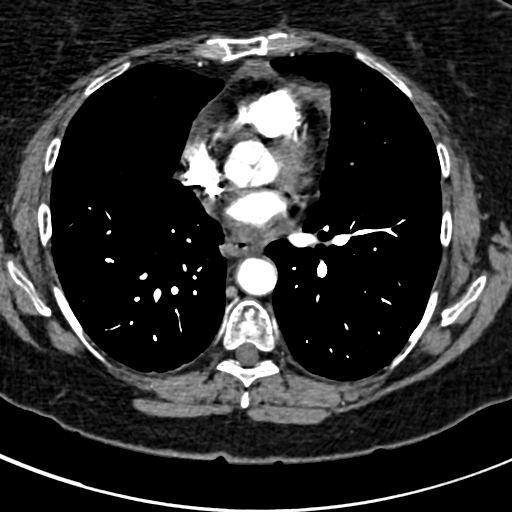
[im 150/272  lung]
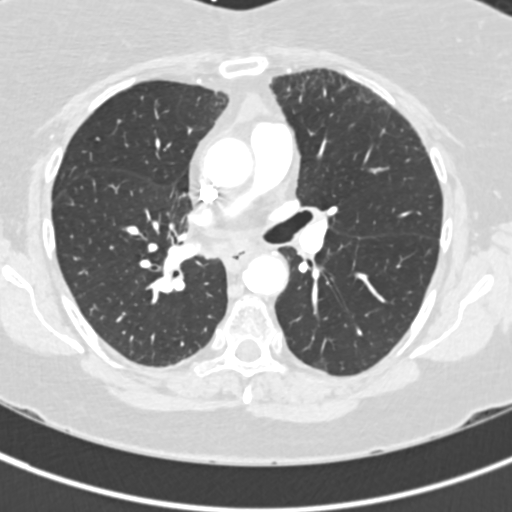
[im 163/272  mediastinal]
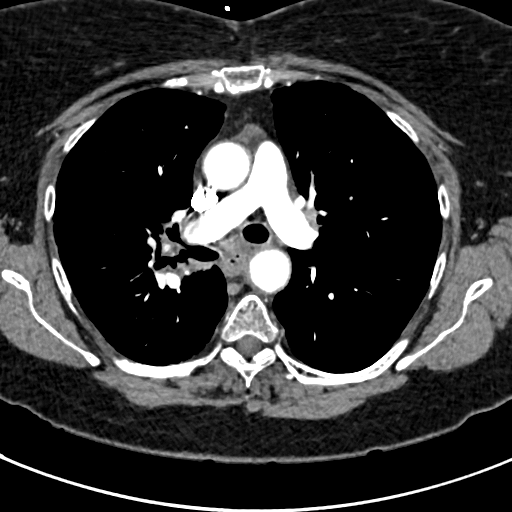
[im 177/272  lung]
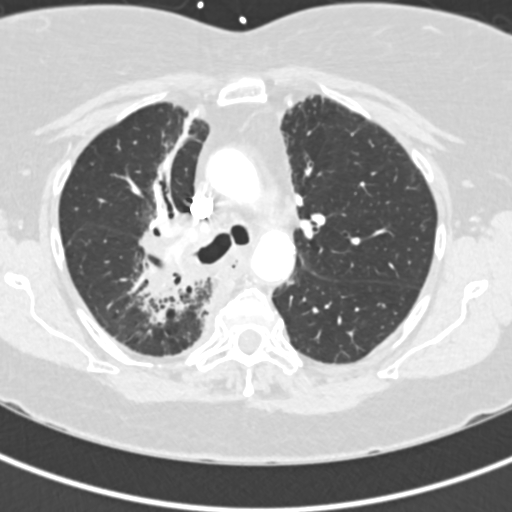
[im 190/272  mediastinal]
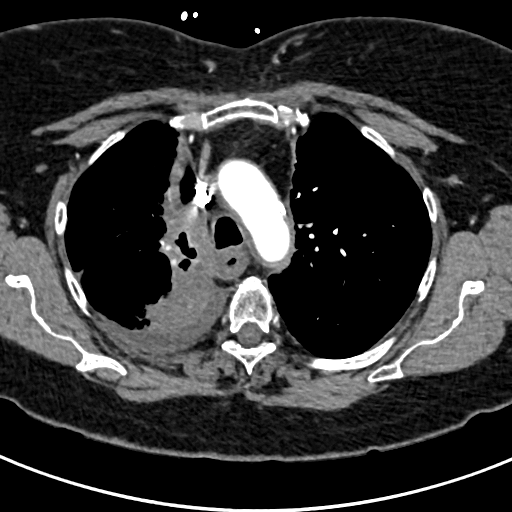
[im 204/272  lung]
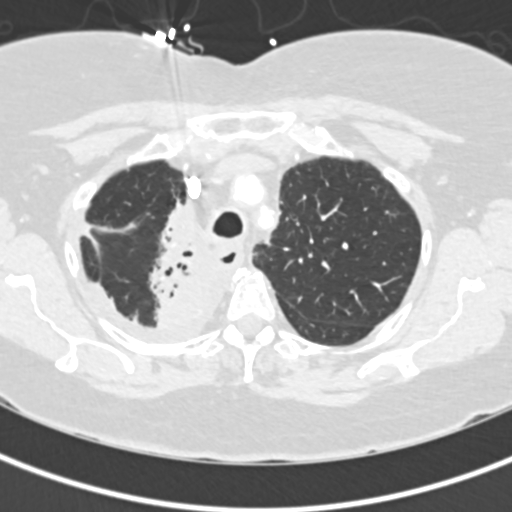
[im 231/272  mediastinal]
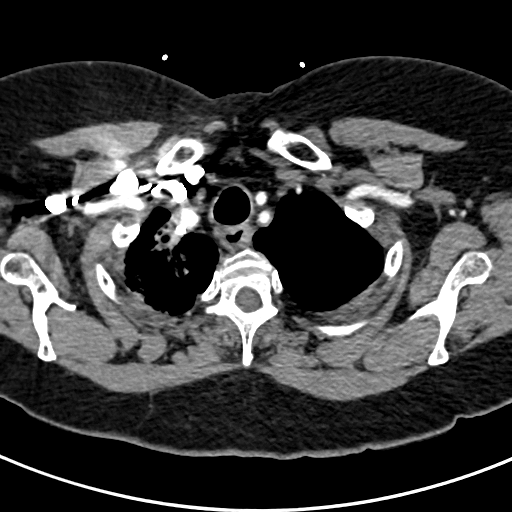
[im 244/272  lung]
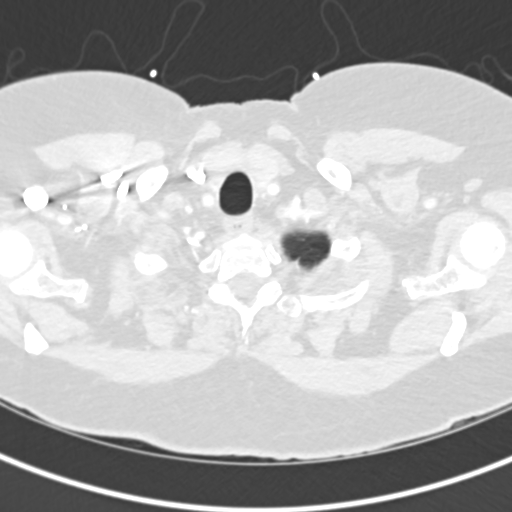
[im 258/272  mediastinal]
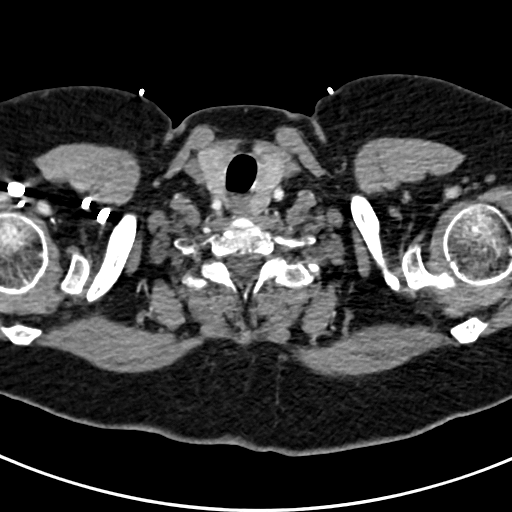

[Series 8: pe 2.0 coronal · coronal · 0.55mm/px · 1 of 138 slices shown]
[im 69/138  mediastinal]
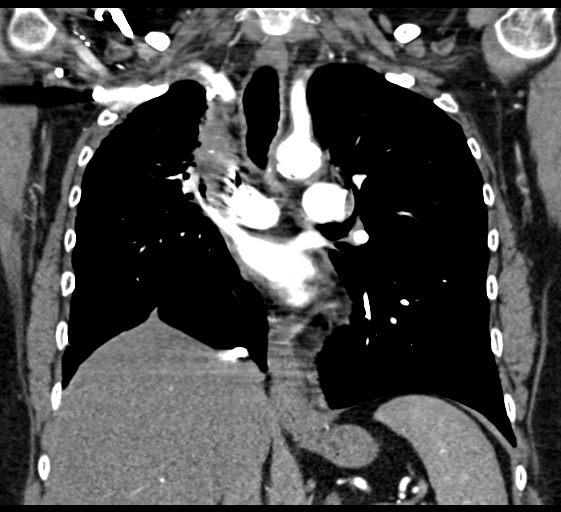

[17 of 36 positions shown; findings below may reference images not displayed]

FINDINGS: Comment: Contrast bolus is suboptimal in that the image acquisition
was timed such that the upper lung pulmonary arteries are
incompletely opacified.

Mediastinum/Lymph Nodes: From the level of the carina cephalad the
upper lung pulmonary arteries are not evaluable on today's PE
protocol CT scan secondary to suboptimal contrast bolus timing.
There is no central pulmonary embolism. There is also very poor
opacification of the distal right main pulmonary artery, with
extensive mixing between opacified and non-opacified blood, however,
no definite embolus is identified in this region. Throughout the mid
to lower lungs there are no filling defects in the pulmonary
arteries to suggest embolus. Heart size is normal. Small amount of
pericardial fluid, slightly increased compared to the prior study,
but unlikely to be of any hemodynamic significance at this time. No
associated pericardial calcification. Prominent soft tissue around
the right hilar region, similar to the prior study, presumably
predominantly postradiation scarring. Otherwise, there is no
definite lymphadenopathy noted in the mediastinal or left hilar
regions. Esophagus is unremarkable in appearance. No axillary
lymphadenopathy.

Lungs/Pleura: The new complete chronic collapse/consolidation of the
right upper lobe, most compatible with postradiation mass-like
fibrosis. Areas of cylindrical and varicose bronchiectasis
throughout the right upper lobe. Compensatory hyperexpansion of the
right middle and lower lobes. Trace partially loculated right apical
pleural effusion, similar to the prior study. 11 x 9 mm appear
ground-glass attenuation nodule in the left upper lobe (image 20 of
series 6), similar to prior examinations. No new suspicious
appearing pulmonary nodules or masses. No acute consolidative
airspace disease.

Upper Abdomen: Status post cholecystectomy.

Musculoskeletal/Soft Tissues: There are no aggressive appearing
lytic or blastic lesions noted in the visualized portions of the
skeleton.

Review of the MIP images confirms the above findings.
IMPRESSION: 1. Study is partially limited by suboptimal contrast bolus, as
discussed above. Within the limitations of today's examination,
there is no definite pulmonary embolism identified.
2. Slight interval increase in small chronic pericardial effusion,
which is unlikely to be of hemodynamic significance at this time.
3. Chronic postradiation changes in the right hemithorax
redemonstrated, similar to prior examinations. No definite findings
to suggest local recurrence of disease.
4. Stable 9 x 11 mm ground-glass attenuation nodule in the left
upper lobe (image 20 of series 6). Continued attention on repeat
noncontrast chest CT is recommended in 12 months to ensure stability
of this lesion.

## 2016-07-17 IMAGING — CR DG CHEST 2V
2 series · 2 of 2 positions shown · non-contrast
Comparison: 01/15/2015 and 11/15/2013

CLINICAL DATA: Left side chest pain

EXAM:
CHEST  2 VIEW

[w chest pa]
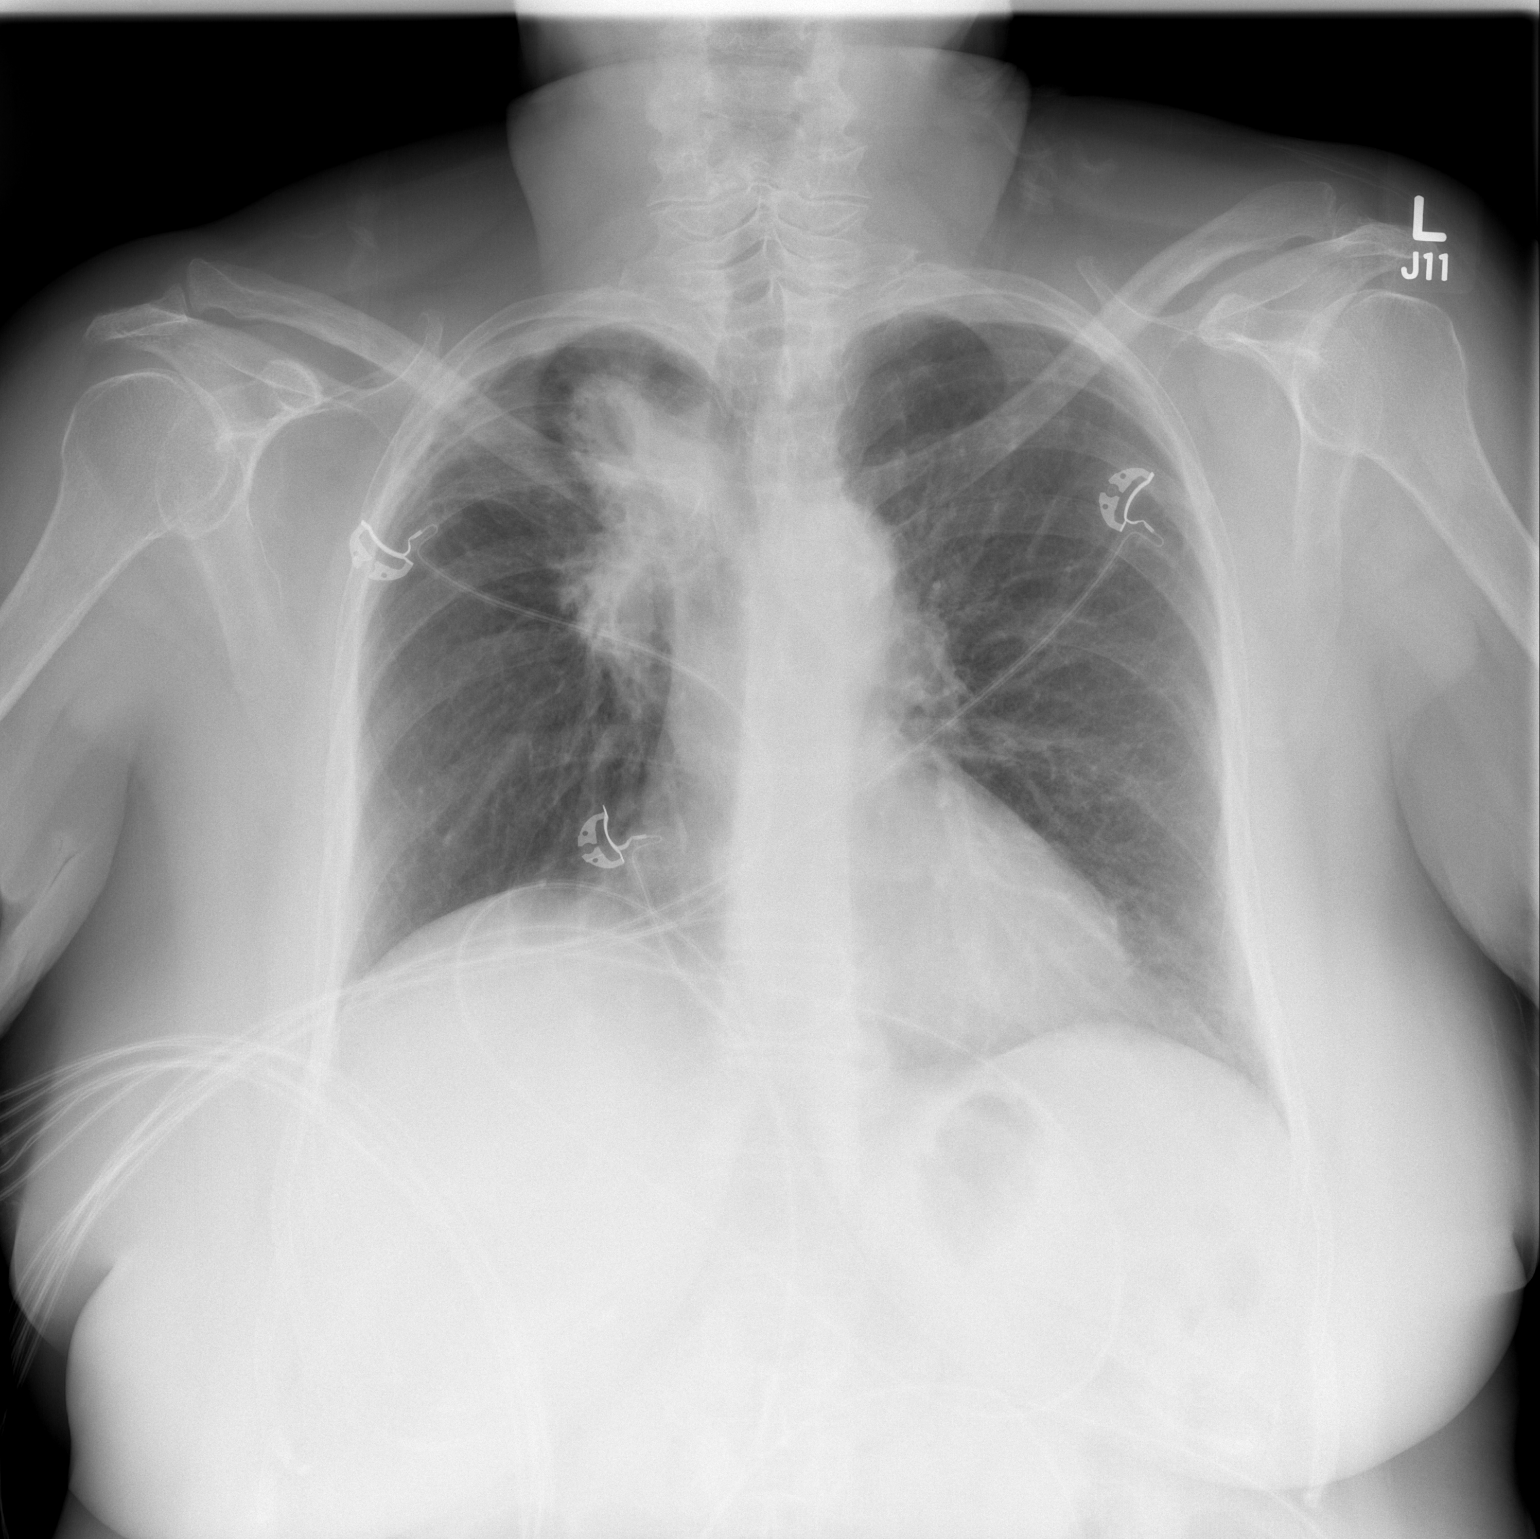

[w chest lat]
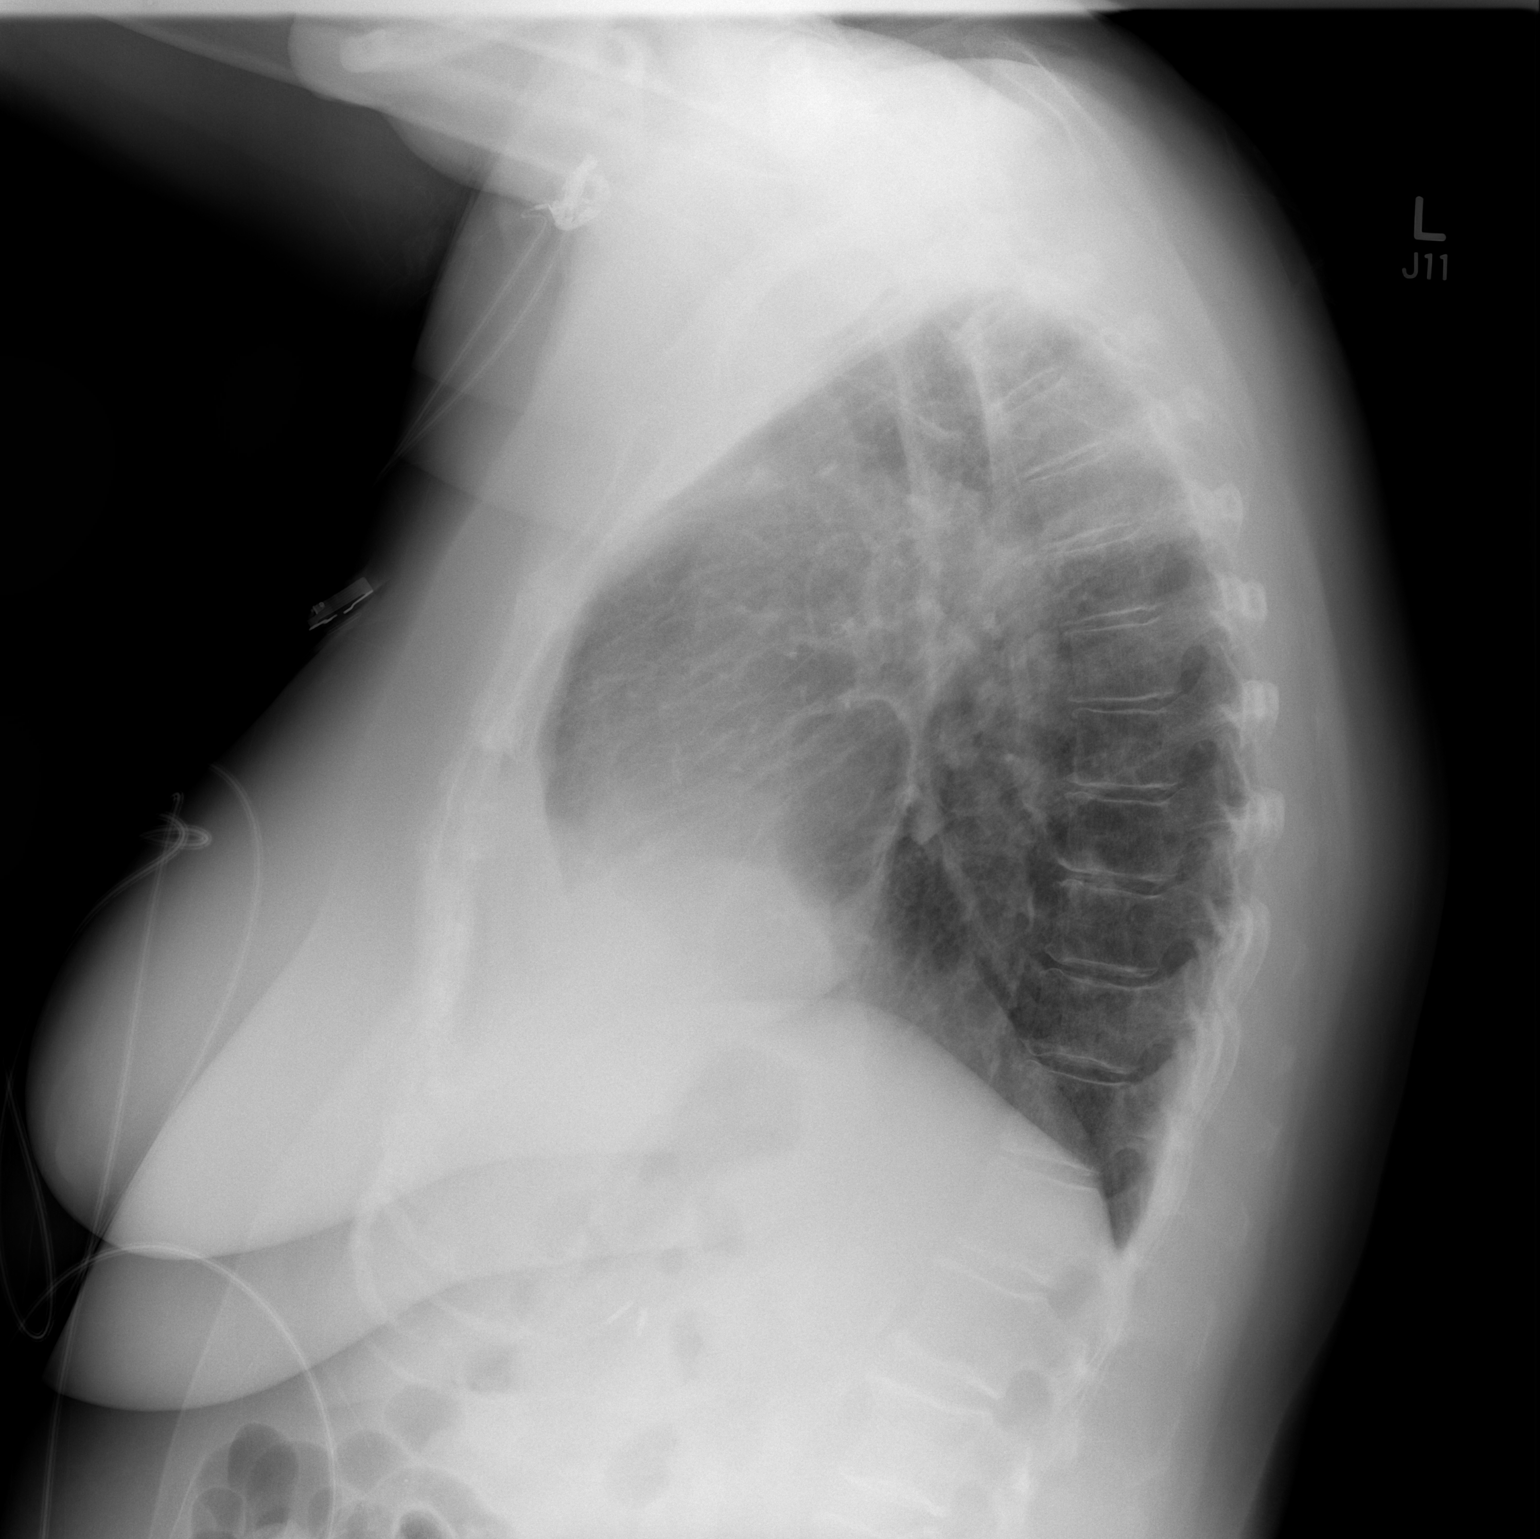

[2 of 2 positions shown; findings below may reference images not displayed]

FINDINGS: Cardiomediastinal silhouette is unremarkable. Stable postradiation
changes in right upper lobe and right suprahilar region. No definite
superimposed infiltrate or pulmonary edema. Left lung is clear. Bony
thorax is unremarkable.
IMPRESSION: No active cardiopulmonary disease. Stable postradiation changes in
right upper lobe and right suprahilar region.

## 2016-07-19 ENCOUNTER — Other Ambulatory Visit: Payer: Self-pay | Admitting: *Deleted

## 2016-07-19 ENCOUNTER — Encounter: Payer: Self-pay | Admitting: *Deleted

## 2016-07-19 NOTE — Patient Outreach (Signed)
Final transition of care call. [name] is doing very well. She denies any abdominal distress. She is following the recommended diet and getting used to saying "no" so some of the foods she used to enjoy. Today is my final outreach and I have notified her of her case closure. I have reminded her she can call me anytime in the future for questions or problems.  [facility] CM Care Plan Problem One   Flowsheet Row Most Recent Value  Care Plan Problem One  New Diagnosis: Diverticulitis  Role Documenting the Problem One  Care Management Coordinator  Care Plan for Problem One  Active  [facility] CM Short Term Goal #1 (0-30 days)  Pt will take all her medications as ordered per pt report at the end of 30 days.  [facility] CM Short Term Goal #1 Start Date  06/28/16  [facility] CM Short Term Goal #1 Met Date  07/19/16  [facility] CM Short Term Goal #2 (0-30 days)  Pt will attend her next MD appt on 07/05/16.  [facility] CM Short Term Goal #2 Start Date  06/28/16  [facility] CM Short Term Goal #2 Met Date  07/06/16    [facility] CM Care Plan Problem Two   Flowsheet Row Most Recent Value  Care Plan Problem Two  No Advanced Directives  Role Documenting the Problem Two  Care Management Coordinator  Care Plan for Problem Two  Active  [facility] Long Term Goal (31-90) days  Pt will complete her HCPOA, Living will and MOST form during the next 31 days.  [facility] Long Term Goal Start Date  06/28/16  [facility] Long Term Goal Met Date  07/19/16     [name] [facility] [phone]

## 2016-07-20 ENCOUNTER — Telehealth: Payer: Self-pay

## 2016-07-20 DIAGNOSIS — A419 Sepsis, unspecified organism: Secondary | ICD-10-CM | POA: Diagnosis not present

## 2016-07-20 DIAGNOSIS — E785 Hyperlipidemia, unspecified: Secondary | ICD-10-CM | POA: Diagnosis not present

## 2016-07-20 DIAGNOSIS — C3490 Malignant neoplasm of unspecified part of unspecified bronchus or lung: Secondary | ICD-10-CM | POA: Diagnosis not present

## 2016-07-20 DIAGNOSIS — Z7982 Long term (current) use of aspirin: Secondary | ICD-10-CM | POA: Diagnosis not present

## 2016-07-20 DIAGNOSIS — Z794 Long term (current) use of insulin: Secondary | ICD-10-CM | POA: Diagnosis not present

## 2016-07-20 DIAGNOSIS — I2510 Atherosclerotic heart disease of native coronary artery without angina pectoris: Secondary | ICD-10-CM | POA: Diagnosis not present

## 2016-07-20 DIAGNOSIS — Z7984 Long term (current) use of oral hypoglycemic drugs: Secondary | ICD-10-CM | POA: Diagnosis not present

## 2016-07-20 DIAGNOSIS — E1165 Type 2 diabetes mellitus with hyperglycemia: Secondary | ICD-10-CM | POA: Diagnosis not present

## 2016-07-20 DIAGNOSIS — K5732 Diverticulitis of large intestine without perforation or abscess without bleeding: Secondary | ICD-10-CM | POA: Diagnosis not present

## 2016-07-20 DIAGNOSIS — K219 Gastro-esophageal reflux disease without esophagitis: Secondary | ICD-10-CM | POA: Diagnosis not present

## 2016-07-20 DIAGNOSIS — I959 Hypotension, unspecified: Secondary | ICD-10-CM | POA: Diagnosis not present

## 2016-07-20 NOTE — Telephone Encounter (Signed)
Herbert Deaner called stating that Ashton was having moderate to severe pain intestinal GI pain before defecation and urination x 2 days.  Please advise.

## 2016-07-21 DIAGNOSIS — I959 Hypotension, unspecified: Secondary | ICD-10-CM | POA: Diagnosis not present

## 2016-07-21 DIAGNOSIS — E1165 Type 2 diabetes mellitus with hyperglycemia: Secondary | ICD-10-CM | POA: Diagnosis not present

## 2016-07-21 DIAGNOSIS — H35373 Puckering of macula, bilateral: Secondary | ICD-10-CM | POA: Diagnosis not present

## 2016-07-21 DIAGNOSIS — A419 Sepsis, unspecified organism: Secondary | ICD-10-CM | POA: Diagnosis not present

## 2016-07-21 DIAGNOSIS — H401131 Primary open-angle glaucoma, bilateral, mild stage: Secondary | ICD-10-CM | POA: Diagnosis not present

## 2016-07-21 DIAGNOSIS — Z7984 Long term (current) use of oral hypoglycemic drugs: Secondary | ICD-10-CM | POA: Diagnosis not present

## 2016-07-21 DIAGNOSIS — Z7982 Long term (current) use of aspirin: Secondary | ICD-10-CM | POA: Diagnosis not present

## 2016-07-21 DIAGNOSIS — E785 Hyperlipidemia, unspecified: Secondary | ICD-10-CM | POA: Diagnosis not present

## 2016-07-21 DIAGNOSIS — K5732 Diverticulitis of large intestine without perforation or abscess without bleeding: Secondary | ICD-10-CM | POA: Diagnosis not present

## 2016-07-21 DIAGNOSIS — E103593 Type 1 diabetes mellitus with proliferative diabetic retinopathy without macular edema, bilateral: Secondary | ICD-10-CM | POA: Diagnosis not present

## 2016-07-21 DIAGNOSIS — K219 Gastro-esophageal reflux disease without esophagitis: Secondary | ICD-10-CM | POA: Diagnosis not present

## 2016-07-21 DIAGNOSIS — C3490 Malignant neoplasm of unspecified part of unspecified bronchus or lung: Secondary | ICD-10-CM | POA: Diagnosis not present

## 2016-07-21 DIAGNOSIS — Z794 Long term (current) use of insulin: Secondary | ICD-10-CM | POA: Diagnosis not present

## 2016-07-21 DIAGNOSIS — H18413 Arcus senilis, bilateral: Secondary | ICD-10-CM | POA: Diagnosis not present

## 2016-07-21 DIAGNOSIS — I2510 Atherosclerotic heart disease of native coronary artery without angina pectoris: Secondary | ICD-10-CM | POA: Diagnosis not present

## 2016-07-21 DIAGNOSIS — E10319 Type 1 diabetes mellitus with unspecified diabetic retinopathy without macular edema: Secondary | ICD-10-CM | POA: Diagnosis not present

## 2016-07-21 MED ORDER — CIPROFLOXACIN HCL 500 MG PO TABS
500.0000 mg | ORAL_TABLET | Freq: Two times a day (BID) | ORAL | 0 refills | Status: AC
Start: 2016-07-21 — End: 2016-07-26

## 2016-07-21 MED ORDER — METRONIDAZOLE 500 MG PO TABS: 500.0000 mg | ORAL_TABLET | Freq: Three times a day (TID) | ORAL | 0 refills | Status: AC

## 2016-07-21 NOTE — Telephone Encounter (Signed)
Will treat as recurrent diverticulitis. I eRx'd cipro and flagyl to her local pharmacy. Pls ask [place] nurse to go back out to assist with med mgmt and do clinical recheck sometime next week.-thx

## 2016-07-21 NOTE — Telephone Encounter (Signed)
Caitlyn from [place] called and stated that the patient is continuing to have left lower quadrant pain before voiding and before BM.  After she goes she feels better. If we are needing Caitlyn from [place] to go back out with the patient they need more orders. Please Advise.

## 2016-07-22 NOTE — Telephone Encounter (Signed)
Patient notified and [place] notified as well.

## 2016-07-26 ENCOUNTER — Encounter (INDEPENDENT_AMBULATORY_CARE_PROVIDER_SITE_OTHER): Payer: Medicare Other | Admitting: Ophthalmology

## 2016-07-26 DIAGNOSIS — I10 Essential (primary) hypertension: Secondary | ICD-10-CM | POA: Diagnosis not present

## 2016-07-26 DIAGNOSIS — H35033 Hypertensive retinopathy, bilateral: Secondary | ICD-10-CM | POA: Diagnosis not present

## 2016-07-26 DIAGNOSIS — E113512 Type 2 diabetes mellitus with proliferative diabetic retinopathy with macular edema, left eye: Secondary | ICD-10-CM

## 2016-07-26 DIAGNOSIS — E11311 Type 2 diabetes mellitus with unspecified diabetic retinopathy with macular edema: Secondary | ICD-10-CM

## 2016-07-26 DIAGNOSIS — H59032 Cystoid macular edema following cataract surgery, left eye: Secondary | ICD-10-CM

## 2016-07-26 DIAGNOSIS — E113591 Type 2 diabetes mellitus with proliferative diabetic retinopathy without macular edema, right eye: Secondary | ICD-10-CM | POA: Diagnosis not present

## 2016-07-28 ENCOUNTER — Encounter: Payer: Self-pay | Admitting: Family Medicine

## 2016-07-28 DIAGNOSIS — K5732 Diverticulitis of large intestine without perforation or abscess without bleeding: Secondary | ICD-10-CM | POA: Diagnosis not present

## 2016-07-28 DIAGNOSIS — I959 Hypotension, unspecified: Secondary | ICD-10-CM | POA: Diagnosis not present

## 2016-07-28 DIAGNOSIS — Z794 Long term (current) use of insulin: Secondary | ICD-10-CM | POA: Diagnosis not present

## 2016-07-28 DIAGNOSIS — E1165 Type 2 diabetes mellitus with hyperglycemia: Secondary | ICD-10-CM | POA: Diagnosis not present

## 2016-07-28 DIAGNOSIS — K219 Gastro-esophageal reflux disease without esophagitis: Secondary | ICD-10-CM | POA: Diagnosis not present

## 2016-07-28 DIAGNOSIS — I2510 Atherosclerotic heart disease of native coronary artery without angina pectoris: Secondary | ICD-10-CM | POA: Diagnosis not present

## 2016-07-28 DIAGNOSIS — Z7982 Long term (current) use of aspirin: Secondary | ICD-10-CM | POA: Diagnosis not present

## 2016-07-28 DIAGNOSIS — A419 Sepsis, unspecified organism: Secondary | ICD-10-CM | POA: Diagnosis not present

## 2016-07-28 DIAGNOSIS — C3490 Malignant neoplasm of unspecified part of unspecified bronchus or lung: Secondary | ICD-10-CM | POA: Diagnosis not present

## 2016-07-28 DIAGNOSIS — E785 Hyperlipidemia, unspecified: Secondary | ICD-10-CM | POA: Diagnosis not present

## 2016-07-28 DIAGNOSIS — Z7984 Long term (current) use of oral hypoglycemic drugs: Secondary | ICD-10-CM | POA: Diagnosis not present

## 2016-07-29 ENCOUNTER — Telehealth: Payer: Self-pay

## 2016-07-29 DIAGNOSIS — E1165 Type 2 diabetes mellitus with hyperglycemia: Secondary | ICD-10-CM | POA: Diagnosis not present

## 2016-07-29 DIAGNOSIS — Z7984 Long term (current) use of oral hypoglycemic drugs: Secondary | ICD-10-CM | POA: Diagnosis not present

## 2016-07-29 DIAGNOSIS — K5732 Diverticulitis of large intestine without perforation or abscess without bleeding: Secondary | ICD-10-CM | POA: Diagnosis not present

## 2016-07-29 DIAGNOSIS — A419 Sepsis, unspecified organism: Secondary | ICD-10-CM | POA: Diagnosis not present

## 2016-07-29 DIAGNOSIS — I2510 Atherosclerotic heart disease of native coronary artery without angina pectoris: Secondary | ICD-10-CM | POA: Diagnosis not present

## 2016-07-29 DIAGNOSIS — C3490 Malignant neoplasm of unspecified part of unspecified bronchus or lung: Secondary | ICD-10-CM | POA: Diagnosis not present

## 2016-07-29 DIAGNOSIS — K219 Gastro-esophageal reflux disease without esophagitis: Secondary | ICD-10-CM | POA: Diagnosis not present

## 2016-07-29 DIAGNOSIS — Z794 Long term (current) use of insulin: Secondary | ICD-10-CM | POA: Diagnosis not present

## 2016-07-29 DIAGNOSIS — I959 Hypotension, unspecified: Secondary | ICD-10-CM | POA: Diagnosis not present

## 2016-07-29 DIAGNOSIS — Z7982 Long term (current) use of aspirin: Secondary | ICD-10-CM | POA: Diagnosis not present

## 2016-07-29 DIAGNOSIS — E785 Hyperlipidemia, unspecified: Secondary | ICD-10-CM | POA: Diagnosis not present

## 2016-07-29 NOTE — Telephone Encounter (Signed)
Received phone message from [facility] stating that there is a severe interaction with the Cipro and Flagyl.  They are also D/C patient from their nursing care.

## 2016-07-31 NOTE — Telephone Encounter (Signed)
Pls call them back and ask them to clarify what they mean by "severe interaction with cipro and flagyl".-thx

## 2016-08-01 NOTE — Telephone Encounter (Signed)
Noted  

## 2016-08-01 NOTE — Telephone Encounter (Signed)
Patient called stating that she stopped the Prednisone. Was causing her to feel worse.  She will try just taking her "regular medicines" for now.  Patient stated that she would call back in not feeling any better.

## 2016-08-02 DIAGNOSIS — E1165 Type 2 diabetes mellitus with hyperglycemia: Secondary | ICD-10-CM | POA: Diagnosis not present

## 2016-08-02 DIAGNOSIS — A419 Sepsis, unspecified organism: Secondary | ICD-10-CM | POA: Diagnosis not present

## 2016-08-02 DIAGNOSIS — I959 Hypotension, unspecified: Secondary | ICD-10-CM | POA: Diagnosis not present

## 2016-08-02 DIAGNOSIS — Z794 Long term (current) use of insulin: Secondary | ICD-10-CM | POA: Diagnosis not present

## 2016-08-02 DIAGNOSIS — K5732 Diverticulitis of large intestine without perforation or abscess without bleeding: Secondary | ICD-10-CM | POA: Diagnosis not present

## 2016-08-02 DIAGNOSIS — E785 Hyperlipidemia, unspecified: Secondary | ICD-10-CM | POA: Diagnosis not present

## 2016-08-02 DIAGNOSIS — I2510 Atherosclerotic heart disease of native coronary artery without angina pectoris: Secondary | ICD-10-CM | POA: Diagnosis not present

## 2016-08-02 DIAGNOSIS — Z7982 Long term (current) use of aspirin: Secondary | ICD-10-CM | POA: Diagnosis not present

## 2016-08-02 DIAGNOSIS — Z7984 Long term (current) use of oral hypoglycemic drugs: Secondary | ICD-10-CM | POA: Diagnosis not present

## 2016-08-02 DIAGNOSIS — C3490 Malignant neoplasm of unspecified part of unspecified bronchus or lung: Secondary | ICD-10-CM | POA: Diagnosis not present

## 2016-08-02 DIAGNOSIS — K219 Gastro-esophageal reflux disease without esophagitis: Secondary | ICD-10-CM | POA: Diagnosis not present

## 2016-08-24 DIAGNOSIS — I472 Ventricular tachycardia: Secondary | ICD-10-CM

## 2016-08-24 DIAGNOSIS — I4721 Torsades de pointes: Secondary | ICD-10-CM

## 2016-08-24 HISTORY — DX: Torsades de pointes: I47.21

## 2016-08-24 HISTORY — DX: Ventricular tachycardia: I47.2

## 2016-09-06 ENCOUNTER — Encounter (INDEPENDENT_AMBULATORY_CARE_PROVIDER_SITE_OTHER): Payer: Medicare Other | Admitting: Ophthalmology

## 2016-09-06 DIAGNOSIS — H35033 Hypertensive retinopathy, bilateral: Secondary | ICD-10-CM

## 2016-09-06 DIAGNOSIS — E113593 Type 2 diabetes mellitus with proliferative diabetic retinopathy without macular edema, bilateral: Secondary | ICD-10-CM

## 2016-09-06 DIAGNOSIS — E11319 Type 2 diabetes mellitus with unspecified diabetic retinopathy without macular edema: Secondary | ICD-10-CM

## 2016-09-06 DIAGNOSIS — R05 Cough: Secondary | ICD-10-CM | POA: Diagnosis not present

## 2016-09-06 DIAGNOSIS — H43813 Vitreous degeneration, bilateral: Secondary | ICD-10-CM

## 2016-09-06 DIAGNOSIS — J441 Chronic obstructive pulmonary disease with (acute) exacerbation: Secondary | ICD-10-CM | POA: Diagnosis not present

## 2016-09-06 DIAGNOSIS — H59032 Cystoid macular edema following cataract surgery, left eye: Secondary | ICD-10-CM | POA: Diagnosis not present

## 2016-09-06 DIAGNOSIS — I10 Essential (primary) hypertension: Secondary | ICD-10-CM

## 2016-09-07 ENCOUNTER — Encounter: Payer: Self-pay | Admitting: Family Medicine

## 2016-09-07 ENCOUNTER — Ambulatory Visit (HOSPITAL_BASED_OUTPATIENT_CLINIC_OR_DEPARTMENT_OTHER)
Admission: RE | Admit: 2016-09-07 | Discharge: 2016-09-07 | Disposition: A | Discharge status: Home / Self Care | Payer: Medicare Other | Source: Ambulatory Visit | Attending: Family Medicine | Admitting: Family Medicine

## 2016-09-07 ENCOUNTER — Ambulatory Visit (INDEPENDENT_AMBULATORY_CARE_PROVIDER_SITE_OTHER): Payer: Medicare Other | Admitting: Family Medicine

## 2016-09-07 VITALS — BP 111/77 | HR 114 | Temp 97.6°F | Resp 18 | Wt 164.8 lb

## 2016-09-07 DIAGNOSIS — J449 Chronic obstructive pulmonary disease, unspecified: Secondary | ICD-10-CM | POA: Diagnosis present

## 2016-09-07 DIAGNOSIS — R059 Cough, unspecified: Secondary | ICD-10-CM

## 2016-09-07 DIAGNOSIS — J441 Chronic obstructive pulmonary disease with (acute) exacerbation: Secondary | ICD-10-CM

## 2016-09-07 DIAGNOSIS — R05 Cough: Secondary | ICD-10-CM | POA: Insufficient documentation

## 2016-09-07 DIAGNOSIS — I700 Atherosclerosis of aorta: Secondary | ICD-10-CM | POA: Diagnosis not present

## 2016-09-07 DIAGNOSIS — Z85118 Personal history of other malignant neoplasm of bronchus and lung: Secondary | ICD-10-CM

## 2016-09-07 DIAGNOSIS — J984 Other disorders of lung: Secondary | ICD-10-CM | POA: Diagnosis not present

## 2016-09-07 MED ORDER — DOXYCYCLINE HYCLATE 100 MG PO TABS: 100.0000 mg | ORAL_TABLET | Freq: Two times a day (BID) | ORAL | 0 refills | Status: DC

## 2016-09-07 MED ORDER — PREDNISONE 20 MG PO TABS: ORAL_TABLET | ORAL | 0 refills | Status: DC

## 2016-09-07 NOTE — Progress Notes (Signed)
Pre visit review using our clinic review tool, if applicable. No additional management support is needed unless otherwise documented below in the visit note. 

## 2016-09-07 NOTE — Patient Instructions (Signed)
Get otc generic robitussin DM OR Mucinex DM and use as directed on the packaging for cough and congestion. Use otc generic saline nasal spray 2-3 times per day to irrigate/moisturize your nasal passages.   

## 2016-09-07 NOTE — Progress Notes (Signed)
OFFICE VISIT  09/07/2016   CC:  Chief Complaint  Patient presents with  . URI    cough   HPI:    Patient is a 70 y.o. Caucasian female who presents for cough.  She is by herself today, has hx of mild cognitive impairment so history is a bit difficult. She does have COPD. She went to [facility] yesterday and was rx'd tessalon perles, which she says made her vomit this morning.  No abd pain.  She ate a donut and cup of coffee after this and has been fine. Unclear when cough started, but yesterday started getting pressure in ears, hoarse voice, worse cough.  No fevers. Took albuterol neb and it helped but it sounds like she last took this yesterday or day before.  No chest pain.  She does feel a bit SOB intermittently.  Past Medical History:  Diagnosis Date  . Age-related nuclear cataract of both eyes 2016   +cortical age related cataracts OU   . Allergy    seasonal  . Arthritis    hands  . Bilateral diabetic retinopathy (HCC) 2015   Dr. Zigmund Daniel  . Blood transfusion without reported diagnosis    when taking chemo  . CAD (coronary artery disease)    NON obstructive. Cath 2006 preserved LV fxn, scattered irregularities without critical stenosis. 2008 stress echo negative for ischemia, but with hypertensive response  . COPD (chronic obstructive pulmonary disease) ([place])   . Diabetes mellitus with complication (HCC)    diab retinopathy OU (laser)  . Diverticulitis 05/2016  . GERD (gastroesophageal reflux disease)    protonix  . History of diverticulitis of colon   . Hyperlipidemia   . Lung cancer ([place])    non-small cell lung ca, stage III in 05/2011; systemic chemotherapy concurrent with radiation followed by prophylactic cranial irradiation and has been observation since July of 2010 with no evidence for disease recurrence-released from onc f/u 12/2014 (needs annual cxr by PCP).  CXR 08/2015 stable.  . Mild cognitive impairment with memory loss    Likely from brain radiation  therapy  . Open-angle glaucoma 2016   Dr. Shirley Muscat, (bilateral)---responding to topical therapy  . Osteopenia 03/2016   03/2016 DEXA T-score -2.2    Past Surgical History:  Procedure Laterality Date  . ABDOMINAL HYSTERECTOMY  1997  . APPENDECTOMY    . CARPAL TUNNEL RELEASE    . CATARACT EXTRACTION    . CHOLECYSTECTOMY  2002  . COLONOSCOPY  10/24/00   normal.  [employer] hemoccult testing via [place] 06/18/15 was NEG  . LEFT HEART CATHETERIZATION WITH CORONARY ANGIOGRAM N/A 05/30/2012   Procedure: LEFT HEART CATHETERIZATION WITH CORONARY ANGIOGRAM;  Surgeon: Burnell Blanks, MD;  Location: [facility] CATH LAB;  Service: Cardiovascular;  Laterality: N/A;    Outpatient Medications Prior to Visit  Medication Sig Dispense Refill  . albuterol (PROVENTIL) (2.5 MG/3ML) 0.083% nebulizer solution USE ONE VIAL IN NEBULIZER EVERY 4 HOURS AS NEEDED FOR WHEEZING OR SHORTNESS OF BREATH 75 mL 3  . aspirin 81 MG tablet Take 81 mg by mouth at bedtime.     Marland Kitchen aspirin-acetaminophen-caffeine (EXCEDRIN MIGRAINE) 250-250-65 MG per tablet Take 1 tablet by mouth every 6 (six) hours as needed for headache.    Marland Kitchen atorvastatin (LIPITOR) 80 MG tablet TAKE ONE TABLET BY MOUTH ONCE DAILY 90 tablet 1  . FLOVENT HFA 220 MCG/ACT inhaler INHALE TWO PUFFS BY MOUTH TWICE DAILY 12 g 6  . fluticasone (FLONASE) 50 MCG/ACT nasal spray Place 2 sprays  into both nostrils at bedtime. (Patient taking differently: Place 2 sprays into both nostrils at bedtime as needed for allergies. ) 16 g 3  . glucose blood (ONE TOUCH ULTRA TEST) test strip TEST 3 TIMES A DAY 100 each 11  . Insulin Isophane & Regular Human (HUMULIN 70/30 KWIKPEN) (70-30) 100 UNIT/ML PEN Inject 16 units subcutaneously before breakfast and 20 units before supper 15 mL 0  . loratadine (CLARITIN) 10 MG tablet Take 10 mg by mouth at bedtime.     . metFORMIN (GLUCOPHAGE) 500 MG tablet TAKE ONE TABLET BY MOUTH TWICE DAILY WITH MEALS 180 tablet 1  . nitroGLYCERIN (NITROSTAT) 0.4 MG  SL tablet Place 1 tablet (0.4 mg total) under the tongue every 5 (five) minutes as needed. For chest pain 50 tablet 3  . ONETOUCH DELICA LANCETS 53G MISC TEST 3 TIMES A DAY 100 each 11  . pantoprazole (PROTONIX) 40 MG tablet TAKE ONE TABLET BY MOUTH ONCE DAILY 30 tablet 12  . saccharomyces boulardii (FLORASTOR) 250 MG capsule Take 1 capsule (250 mg total) by mouth 2 (two) times daily.    . timolol (BETIMOL) 0.5 % ophthalmic solution Place 1 drop into both eyes every morning.    . cholestyramine (QUESTRAN) 4 g packet Take 1 packet (4 g total) by mouth 2 (two) times daily. 10 each 0  . prednisoLONE acetate (PRED FORTE) 1 % ophthalmic suspension Place 1 drop into the left eye 4 (four) times daily.    Marland Kitchen PROLENSA 0.07 % SOLN Place 1 drop into the left eye at bedtime.    Marland Kitchen amoxicillin-clavulanate (AUGMENTIN) 875-125 MG tablet Take 1 tablet by mouth 2 (two) times daily.     No facility-administered medications prior to visit.     Allergies  Allergen Reactions  . Lactose Intolerance (Gi) Diarrhea  . Ibuprofen     REACTION: Anxiousness, hypoventilate     ROS As per HPI  PE: Blood pressure 111/77, pulse (!) 114, temperature 97.6 F (36.4 C), temperature source Temporal, resp. rate 18, weight 164 lb 12.8 oz (74.8 kg), SpO2 95 %. Gen: Alert, well appearing.  Patient is oriented to person, place, time, and situation. AFFECT: pleasant, lucid thought and speech. ENT: Ears: EACs clear, normal epithelium.  TMs with good light reflex and landmarks bilaterally.  Eyes: no injection, icteris, swelling, or exudate.  EOMI, PERRLA. Nose: no drainage or turbinate edema/swelling.  No injection or focal lesion.  Mouth: lips without lesion/swelling.  Oral mucosa pink and moist.  Dentition intact and without obvious caries or gingival swelling.  Oropharynx without erythema, exudate, or swelling.  Neck - No masses or thyromegaly or limitation in range of motion CV: Regular, tachy to 110-120 range, no m/r/g Chest  is clear, no wheezing or rales. Normal symmetric air entry throughout both lung fields. No chest wall deformities or tenderness. EXT: no clubbing, cyanosis, or edema.   LABS:  none  IMPRESSION AND PLAN:  COPD exacerbation likely.   Prednisone '40mg'$  qd x 5d. Doxycycline 100 mg bid x 10d. Continue albuterol neb q4h prn. Check CXR today, both as part of w/u for today's illness AND b/c she is due for annual surveillance CXR for her remote hx of lung cancer.  Get otc generic robitussin DM OR Mucinex DM and use as directed on the packaging for cough and congestion. Use otc generic saline nasal spray 2-3 times per day to irrigate/moisturize your nasal passages.  An After Visit Summary was printed and given to the patient.  FOLLOW UP:  Return in about 5 days (around 09/12/2016) for f/u COPD exacerbation.  Signed:  Crissie Sickles, MD           09/07/2016

## 2016-09-11 ENCOUNTER — Inpatient Hospital Stay (HOSPITAL_COMMUNITY)
Admission: EM | Admit: 2016-09-11 | Discharge: 2016-09-27 | DRG: 391 | Disposition: A | Discharge status: Skilled Nursing Facility | Payer: Medicare Other | Attending: Internal Medicine | Admitting: Internal Medicine

## 2016-09-11 ENCOUNTER — Encounter (HOSPITAL_COMMUNITY): Payer: Self-pay

## 2016-09-11 ENCOUNTER — Emergency Department (HOSPITAL_COMMUNITY): Payer: Medicare Other

## 2016-09-11 DIAGNOSIS — E1122 Type 2 diabetes mellitus with diabetic chronic kidney disease: Secondary | ICD-10-CM | POA: Diagnosis not present

## 2016-09-11 DIAGNOSIS — Z87891 Personal history of nicotine dependence: Secondary | ICD-10-CM

## 2016-09-11 DIAGNOSIS — D649 Anemia, unspecified: Secondary | ICD-10-CM | POA: Diagnosis not present

## 2016-09-11 DIAGNOSIS — K219 Gastro-esophageal reflux disease without esophagitis: Secondary | ICD-10-CM | POA: Diagnosis present

## 2016-09-11 DIAGNOSIS — I4901 Ventricular fibrillation: Secondary | ICD-10-CM | POA: Diagnosis not present

## 2016-09-11 DIAGNOSIS — Z85118 Personal history of other malignant neoplasm of bronchus and lung: Secondary | ICD-10-CM | POA: Diagnosis not present

## 2016-09-11 DIAGNOSIS — M19041 Primary osteoarthritis, right hand: Secondary | ICD-10-CM | POA: Diagnosis not present

## 2016-09-11 DIAGNOSIS — M19042 Primary osteoarthritis, left hand: Secondary | ICD-10-CM | POA: Diagnosis present

## 2016-09-11 DIAGNOSIS — I959 Hypotension, unspecified: Secondary | ICD-10-CM | POA: Diagnosis not present

## 2016-09-11 DIAGNOSIS — I6340 Cerebral infarction due to embolism of unspecified cerebral artery: Secondary | ICD-10-CM | POA: Diagnosis not present

## 2016-09-11 DIAGNOSIS — I5181 Takotsubo syndrome: Secondary | ICD-10-CM | POA: Diagnosis not present

## 2016-09-11 DIAGNOSIS — Z9221 Personal history of antineoplastic chemotherapy: Secondary | ICD-10-CM

## 2016-09-11 DIAGNOSIS — I5023 Acute on chronic systolic (congestive) heart failure: Secondary | ICD-10-CM | POA: Diagnosis present

## 2016-09-11 DIAGNOSIS — Z452 Encounter for adjustment and management of vascular access device: Secondary | ICD-10-CM

## 2016-09-11 DIAGNOSIS — I130 Hypertensive heart and chronic kidney disease with heart failure and stage 1 through stage 4 chronic kidney disease, or unspecified chronic kidney disease: Secondary | ICD-10-CM | POA: Diagnosis not present

## 2016-09-11 DIAGNOSIS — J449 Chronic obstructive pulmonary disease, unspecified: Secondary | ICD-10-CM | POA: Diagnosis present

## 2016-09-11 DIAGNOSIS — R079 Chest pain, unspecified: Secondary | ICD-10-CM | POA: Diagnosis not present

## 2016-09-11 DIAGNOSIS — I2510 Atherosclerotic heart disease of native coronary artery without angina pectoris: Secondary | ICD-10-CM | POA: Diagnosis present

## 2016-09-11 DIAGNOSIS — N189 Chronic kidney disease, unspecified: Secondary | ICD-10-CM | POA: Diagnosis present

## 2016-09-11 DIAGNOSIS — E876 Hypokalemia: Secondary | ICD-10-CM | POA: Diagnosis not present

## 2016-09-11 DIAGNOSIS — E872 Acidosis: Secondary | ICD-10-CM | POA: Diagnosis not present

## 2016-09-11 DIAGNOSIS — IMO0002 Reserved for concepts with insufficient information to code with codable children: Secondary | ICD-10-CM | POA: Diagnosis present

## 2016-09-11 DIAGNOSIS — I469 Cardiac arrest, cause unspecified: Secondary | ICD-10-CM | POA: Diagnosis not present

## 2016-09-11 DIAGNOSIS — E782 Mixed hyperlipidemia: Secondary | ICD-10-CM | POA: Diagnosis present

## 2016-09-11 DIAGNOSIS — E11319 Type 2 diabetes mellitus with unspecified diabetic retinopathy without macular edema: Secondary | ICD-10-CM | POA: Diagnosis present

## 2016-09-11 DIAGNOSIS — E2740 Unspecified adrenocortical insufficiency: Secondary | ICD-10-CM | POA: Diagnosis not present

## 2016-09-11 DIAGNOSIS — H4010X0 Unspecified open-angle glaucoma, stage unspecified: Secondary | ICD-10-CM | POA: Diagnosis not present

## 2016-09-11 DIAGNOSIS — N179 Acute kidney failure, unspecified: Secondary | ICD-10-CM | POA: Diagnosis not present

## 2016-09-11 DIAGNOSIS — Z794 Long term (current) use of insulin: Secondary | ICD-10-CM

## 2016-09-11 DIAGNOSIS — Z01818 Encounter for other preprocedural examination: Secondary | ICD-10-CM

## 2016-09-11 DIAGNOSIS — R200 Anesthesia of skin: Secondary | ICD-10-CM

## 2016-09-11 DIAGNOSIS — E1165 Type 2 diabetes mellitus with hyperglycemia: Secondary | ICD-10-CM | POA: Diagnosis not present

## 2016-09-11 DIAGNOSIS — I129 Hypertensive chronic kidney disease with stage 1 through stage 4 chronic kidney disease, or unspecified chronic kidney disease: Secondary | ICD-10-CM

## 2016-09-11 DIAGNOSIS — Z886 Allergy status to analgesic agent status: Secondary | ICD-10-CM

## 2016-09-11 DIAGNOSIS — J9600 Acute respiratory failure, unspecified whether with hypoxia or hypercapnia: Secondary | ICD-10-CM

## 2016-09-11 DIAGNOSIS — K5720 Diverticulitis of large intestine with perforation and abscess without bleeding: Secondary | ICD-10-CM

## 2016-09-11 DIAGNOSIS — R0602 Shortness of breath: Secondary | ICD-10-CM

## 2016-09-11 DIAGNOSIS — K5792 Diverticulitis of intestine, part unspecified, without perforation or abscess without bleeding: Secondary | ICD-10-CM

## 2016-09-11 DIAGNOSIS — R0600 Dyspnea, unspecified: Secondary | ICD-10-CM

## 2016-09-11 DIAGNOSIS — E118 Type 2 diabetes mellitus with unspecified complications: Secondary | ICD-10-CM

## 2016-09-11 DIAGNOSIS — K5732 Diverticulitis of large intestine without perforation or abscess without bleeding: Secondary | ICD-10-CM

## 2016-09-11 DIAGNOSIS — R300 Dysuria: Secondary | ICD-10-CM

## 2016-09-11 DIAGNOSIS — Z7951 Long term (current) use of inhaled steroids: Secondary | ICD-10-CM

## 2016-09-11 DIAGNOSIS — J9690 Respiratory failure, unspecified, unspecified whether with hypoxia or hypercapnia: Secondary | ICD-10-CM | POA: Diagnosis not present

## 2016-09-11 DIAGNOSIS — I420 Dilated cardiomyopathy: Secondary | ICD-10-CM | POA: Diagnosis present

## 2016-09-11 DIAGNOSIS — Z8249 Family history of ischemic heart disease and other diseases of the circulatory system: Secondary | ICD-10-CM

## 2016-09-11 DIAGNOSIS — Z841 Family history of disorders of kidney and ureter: Secondary | ICD-10-CM

## 2016-09-11 DIAGNOSIS — J9601 Acute respiratory failure with hypoxia: Secondary | ICD-10-CM | POA: Diagnosis not present

## 2016-09-11 DIAGNOSIS — J438 Other emphysema: Secondary | ICD-10-CM | POA: Diagnosis not present

## 2016-09-11 DIAGNOSIS — I472 Ventricular tachycardia: Secondary | ICD-10-CM | POA: Diagnosis not present

## 2016-09-11 DIAGNOSIS — R531 Weakness: Secondary | ICD-10-CM

## 2016-09-11 DIAGNOSIS — R1030 Lower abdominal pain, unspecified: Secondary | ICD-10-CM | POA: Diagnosis not present

## 2016-09-11 DIAGNOSIS — K651 Peritoneal abscess: Secondary | ICD-10-CM | POA: Diagnosis not present

## 2016-09-11 DIAGNOSIS — I63412 Cerebral infarction due to embolism of left middle cerebral artery: Secondary | ICD-10-CM | POA: Diagnosis not present

## 2016-09-11 DIAGNOSIS — Z7982 Long term (current) use of aspirin: Secondary | ICD-10-CM

## 2016-09-11 DIAGNOSIS — I4721 Torsades de pointes: Secondary | ICD-10-CM

## 2016-09-11 DIAGNOSIS — C3490 Malignant neoplasm of unspecified part of unspecified bronchus or lung: Secondary | ICD-10-CM | POA: Diagnosis present

## 2016-09-11 DIAGNOSIS — Z923 Personal history of irradiation: Secondary | ICD-10-CM

## 2016-09-11 DIAGNOSIS — E739 Lactose intolerance, unspecified: Secondary | ICD-10-CM | POA: Diagnosis present

## 2016-09-11 DIAGNOSIS — Z833 Family history of diabetes mellitus: Secondary | ICD-10-CM

## 2016-09-11 DIAGNOSIS — I639 Cerebral infarction, unspecified: Secondary | ICD-10-CM | POA: Diagnosis not present

## 2016-09-11 HISTORY — DX: Diverticulitis of large intestine with perforation and abscess without bleeding: K57.20

## 2016-09-11 HISTORY — DX: Essential (primary) hypertension: I10

## 2016-09-11 LAB — COMPREHENSIVE METABOLIC PANEL
ALBUMIN: 3.5 g/dL (ref 3.5–5.0)
ALT: 16 U/L (ref 14–54)
AST: 17 U/L (ref 15–41)
Alkaline Phosphatase: 86 U/L (ref 38–126)
Anion gap: 9 (ref 5–15)
BILIRUBIN TOTAL: 0.5 mg/dL (ref 0.3–1.2)
BUN: 16 mg/dL (ref 6–20)
CO2: 26 mmol/L (ref 22–32)
CREATININE: 0.92 mg/dL (ref 0.44–1.00)
Calcium: 9.5 mg/dL (ref 8.9–10.3)
Chloride: 105 mmol/L (ref 101–111)
GFR calc Af Amer: >60 mL/min (ref >60)
GLUCOSE: 204 mg/dL — AB (ref 65–99)
POTASSIUM: 3.8 mmol/L (ref 3.5–5.1)
Sodium: 140 mmol/L (ref 135–145)
TOTAL PROTEIN: 6.9 g/dL (ref 6.5–8.1)

## 2016-09-11 LAB — CBC WITH DIFFERENTIAL/PLATELET
BASOS PCT: 0 %
Basophils Absolute: 0 10*3/uL (ref 0.0–0.1)
EOS PCT: 0 %
Eosinophils Absolute: 0 10*3/uL (ref 0.0–0.7)
HEMATOCRIT: 45.3 % (ref 36.0–46.0)
Hemoglobin: 15.2 g/dL — ABNORMAL HIGH (ref 12.0–15.0)
LYMPHS PCT: 8 %
Lymphs Abs: 1.3 10*3/uL (ref 0.7–4.0)
MCH: 30.3 pg (ref 26.0–34.0)
MCHC: 33.6 g/dL (ref 30.0–36.0)
MCV: 90.2 fL (ref 78.0–100.0)
MONO ABS: 1.6 10*3/uL — AB (ref 0.1–1.0)
MONOS PCT: 11 %
NEUTROS ABS: 12.4 10*3/uL — AB (ref 1.7–7.7)
Neutrophils Relative %: 81 %
PLATELETS: 261 10*3/uL (ref 150–400)
RBC: 5.02 MIL/uL (ref 3.87–5.11)
RDW: 15 % (ref 11.5–15.5)
WBC: 15.4 10*3/uL — ABNORMAL HIGH (ref 4.0–10.5)

## 2016-09-11 LAB — I-STAT CG4 LACTIC ACID, ED: LACTIC ACID, VENOUS: 1.71 mmol/L (ref 0.5–1.9)

## 2016-09-11 LAB — URINALYSIS, ROUTINE W REFLEX MICROSCOPIC
BILIRUBIN URINE: NEGATIVE
GLUCOSE, UA: NEGATIVE mg/dL
Hgb urine dipstick: NEGATIVE
KETONES UR: NEGATIVE mg/dL
Nitrite: NEGATIVE
PH: 6 (ref 5.0–8.0)
PROTEIN: NEGATIVE mg/dL
Specific Gravity, Urine: >1.046 — ABNORMAL HIGH (ref 1.005–1.030)

## 2016-09-11 LAB — URINE MICROSCOPIC-ADD ON: Bacteria, UA: NONE SEEN

## 2016-09-11 LAB — LIPASE, BLOOD: Lipase: 29 U/L (ref 11–51)

## 2016-09-11 MED ORDER — ACETAMINOPHEN 325 MG PO TABS
650.0000 mg | ORAL_TABLET | Freq: Four times a day (QID) | ORAL | Status: DC | PRN
  Administered 2016-09-12: 650 mg via ORAL
  Filled 2016-09-11: qty 2

## 2016-09-11 MED ORDER — ALBUTEROL SULFATE HFA 108 (90 BASE) MCG/ACT IN AERS: 2.0000 | INHALATION_SPRAY | Freq: Four times a day (QID) | RESPIRATORY_TRACT | Status: DC | PRN

## 2016-09-11 MED ORDER — BISACODYL 10 MG RE SUPP: 10.0000 mg | Freq: Every day | RECTAL | Status: DC | PRN

## 2016-09-11 MED ORDER — SODIUM CHLORIDE 0.9 % IV SOLN
1000.0000 mL | INTRAVENOUS | Status: DC
  Administered 2016-09-11 – 2016-09-16 (×11): 1000 mL via INTRAVENOUS

## 2016-09-11 MED ORDER — INSULIN ASPART 100 UNIT/ML ~~LOC~~ SOLN: 0.0000 [IU] | Freq: Three times a day (TID) | SUBCUTANEOUS | Status: DC

## 2016-09-11 MED ORDER — SODIUM CHLORIDE 0.9 % IV BOLUS (SEPSIS)
1000.0000 mL | Freq: Once | INTRAVENOUS | Status: AC
  Administered 2016-09-11: 1000 mL via INTRAVENOUS

## 2016-09-11 MED ORDER — SENNOSIDES-DOCUSATE SODIUM 8.6-50 MG PO TABS: 1.0000 | ORAL_TABLET | Freq: Every evening | ORAL | Status: DC | PRN

## 2016-09-11 MED ORDER — IOPAMIDOL (ISOVUE-300) INJECTION 61%
INTRAVENOUS | Status: AC
  Administered 2016-09-11: 100 mL
  Filled 2016-09-11: qty 100

## 2016-09-11 MED ORDER — ASPIRIN EC 81 MG PO TBEC
81.0000 mg | DELAYED_RELEASE_TABLET | Freq: Every day | ORAL | Status: DC
  Administered 2016-09-12 – 2016-09-13 (×2): 81 mg via ORAL
  Filled 2016-09-11 (×3): qty 1

## 2016-09-11 MED ORDER — INSULIN ASPART 100 UNIT/ML ~~LOC~~ SOLN
0.0000 [IU] | Freq: Four times a day (QID) | SUBCUTANEOUS | Status: DC
  Administered 2016-09-12 (×2): 1 [IU] via SUBCUTANEOUS

## 2016-09-11 MED ORDER — MAGNESIUM CITRATE PO SOLN: 1.0000 | Freq: Once | ORAL | Status: DC | PRN

## 2016-09-11 MED ORDER — ENOXAPARIN SODIUM 40 MG/0.4ML ~~LOC~~ SOLN
40.0000 mg | SUBCUTANEOUS | Status: DC
  Filled 2016-09-11: qty 0.4

## 2016-09-11 MED ORDER — PIPERACILLIN-TAZOBACTAM 3.375 G IVPB
3.3750 g | Freq: Three times a day (TID) | INTRAVENOUS | Status: DC
  Administered 2016-09-11 – 2016-09-21 (×28): 3.375 g via INTRAVENOUS
  Filled 2016-09-11 (×33): qty 50

## 2016-09-11 MED ORDER — ALBUTEROL SULFATE (2.5 MG/3ML) 0.083% IN NEBU: 2.5000 mg | INHALATION_SOLUTION | RESPIRATORY_TRACT | Status: DC | PRN

## 2016-09-11 MED ORDER — ATORVASTATIN CALCIUM 80 MG PO TABS
80.0000 mg | ORAL_TABLET | Freq: Every day | ORAL | Status: DC
  Administered 2016-09-12 – 2016-09-13 (×2): 80 mg via ORAL
  Filled 2016-09-11 (×2): qty 1

## 2016-09-11 MED ORDER — ONDANSETRON HCL 4 MG/2ML IJ SOLN
4.0000 mg | Freq: Four times a day (QID) | INTRAMUSCULAR | Status: DC | PRN
  Administered 2016-09-13 – 2016-09-24 (×8): 4 mg via INTRAVENOUS
  Filled 2016-09-11 (×7): qty 2

## 2016-09-11 MED ORDER — PANTOPRAZOLE SODIUM 40 MG PO TBEC
40.0000 mg | DELAYED_RELEASE_TABLET | Freq: Every day | ORAL | Status: DC
  Administered 2016-09-12 – 2016-09-13 (×2): 40 mg via ORAL
  Filled 2016-09-11 (×2): qty 1

## 2016-09-11 MED ORDER — HYDROCODONE-ACETAMINOPHEN 5-325 MG PO TABS: 1.0000 | ORAL_TABLET | ORAL | Status: DC | PRN

## 2016-09-11 MED ORDER — FENTANYL CITRATE (PF) 100 MCG/2ML IJ SOLN
50.0000 ug | Freq: Once | INTRAMUSCULAR | Status: AC
  Administered 2016-09-11: 50 ug via INTRAVENOUS
  Filled 2016-09-11: qty 2

## 2016-09-11 MED ORDER — SODIUM CHLORIDE 0.9 % IV BOLUS (SEPSIS)
250.0000 mL | Freq: Once | INTRAVENOUS | Status: AC
Start: 2016-09-11 — End: 2016-09-11
  Administered 2016-09-11: 250 mL via INTRAVENOUS

## 2016-09-11 MED ORDER — PREDNISONE 20 MG PO TABS
40.0000 mg | ORAL_TABLET | Freq: Every day | ORAL | Status: DC
  Administered 2016-09-12: 40 mg via ORAL
  Filled 2016-09-11: qty 2

## 2016-09-11 MED ORDER — ONDANSETRON HCL 4 MG PO TABS: 4.0000 mg | ORAL_TABLET | Freq: Four times a day (QID) | ORAL | Status: DC | PRN

## 2016-09-11 MED ORDER — TRAZODONE HCL 50 MG PO TABS: 25.0000 mg | ORAL_TABLET | Freq: Every evening | ORAL | Status: DC | PRN

## 2016-09-11 MED ORDER — ACETAMINOPHEN 650 MG RE SUPP: 650.0000 mg | Freq: Four times a day (QID) | RECTAL | Status: DC | PRN

## 2016-09-11 MED ORDER — ACETAMINOPHEN 500 MG PO TABS: 1000.0000 mg | ORAL_TABLET | Freq: Four times a day (QID) | ORAL | Status: DC | PRN

## 2016-09-11 MED ORDER — PIPERACILLIN-TAZOBACTAM 3.375 G IVPB 30 MIN
3.3750 g | Freq: Once | INTRAVENOUS | Status: AC
  Administered 2016-09-11: 3.375 g via INTRAVENOUS
  Filled 2016-09-11: qty 50

## 2016-09-11 MED ORDER — SODIUM CHLORIDE 0.9% FLUSH
3.0000 mL | Freq: Two times a day (BID) | INTRAVENOUS | Status: DC
  Administered 2016-09-11 – 2016-09-26 (×21): 3 mL via INTRAVENOUS

## 2016-09-11 NOTE — ED Notes (Signed)
Pt to be taken upstairs by [place], NT

## 2016-09-11 NOTE — ED Notes (Signed)
Called Lab to add on Lipase

## 2016-09-11 NOTE — ED Triage Notes (Signed)
Patient here with lower pelvic pain and weakness x 1 day, states that the pain is worse with ambulation. Pale on arrival.

## 2016-09-11 NOTE — Consult Note (Signed)
aReason for Consult: Diverticulitis with abscess Referring Physician: Dr. Jacob Stinson  [name] is an 70 y.o. female.  HPI: This patient was admitted earlier today and surgery has been asked to consult on the patient. She presented with the sudden onset of severe left lower quadrant abdominal pain. She has multiple chronic medical problems. The pain started yesterday. She has had some malodorous urine. She denies nausea or vomiting. Appetite and bowel movements have been normal up until now.  I am not certain of the date of her last colonoscopy there are poorly was negative. She denies fevers or chills.  Past Medical History:  Diagnosis Date  . Age-related nuclear cataract of both eyes 2016   +cortical age related cataracts OU   . Allergy    seasonal  . Arthritis    hands  . Bilateral diabetic retinopathy (HCC) 2015   Dr. Matthews  . Blood transfusion without reported diagnosis    when taking chemo  . CAD (coronary artery disease)    NON obstructive. Cath 2006 preserved LV fxn, scattered irregularities without critical stenosis. 2008 stress echo negative for ischemia, but with hypertensive response  . COPD (chronic obstructive pulmonary disease) (HCC)   . Diabetes mellitus with complication (HCC)    diab retinopathy OU (laser)  . Diverticulitis 05/2016  . GERD (gastroesophageal reflux disease)    protonix  . History of diverticulitis of colon   . Hyperlipidemia   . Hypertension   . Lung cancer (HCC)    non-small cell lung ca, stage III in 05/2011; systemic chemotherapy concurrent with radiation followed by prophylactic cranial irradiation and has been observation since July of 2010 with no evidence for disease recurrence-released from onc f/u 12/2014 (needs annual cxr by PCP).  CXR 08/2015 stable.  . Mild cognitive impairment with memory loss    Likely from brain radiation therapy  . Open-angle glaucoma 2016   Dr. Bernstorf, (bilateral)---responding to topical therapy  .  Osteopenia 03/2016   03/2016 DEXA T-score -2.2    Past Surgical History:  Procedure Laterality Date  . ABDOMINAL HYSTERECTOMY  1997  . APPENDECTOMY    . CARPAL TUNNEL RELEASE    . CATARACT EXTRACTION    . CHOLECYSTECTOMY  2002  . COLONOSCOPY  10/24/00   normal.  [employer] hemoccult testing via [facility] 06/18/15 was NEG  . LEFT HEART CATHETERIZATION WITH CORONARY ANGIOGRAM N/A 05/30/2012   Procedure: LEFT HEART CATHETERIZATION WITH CORONARY ANGIOGRAM;  Surgeon: Christopher D McAlhany, MD;  Location: [facility] CATH LAB;  Service: Cardiovascular;  Laterality: N/A;    Family History  Problem Relation Age of Onset  . Heart failure Mother   . Kidney disease Mother     renal failure  . Diabetes Mother   . Diabetes Father   . Diabetes Brother   . Heart disease Brother   . Heart attack Brother   . Heart attack Brother   . Heart attack Brother   . Colon cancer Neg Hx     Social History:  reports that she quit smoking about 7 years ago. Her smoking use included Cigarettes. She has a 45.00 pack-year smoking history. She has never used smokeless tobacco. She reports that she does not drink alcohol or use drugs.  Allergies:  Allergies  Allergen Reactions  . Lactose Intolerance (Gi) Diarrhea  . Ibuprofen     REACTION: Anxiousness, hyperventilates    Medications: I have reviewed the patient's current medications.  Results for orders placed or performed during the hospital encounter of   09/11/16 (from the past 48 hour(s))  Comprehensive metabolic panel     Status: Abnormal   Collection Time: 09/11/16 10:30 AM  Result Value Ref Range   Sodium 140 135 - 145 mmol/L   Potassium 3.8 3.5 - 5.1 mmol/L   Chloride 105 101 - 111 mmol/L   CO2 26 22 - 32 mmol/L   Glucose, Bld 204 (H) 65 - 99 mg/dL   BUN 16 6 - 20 mg/dL   Creatinine, Ser 0.92 0.44 - 1.00 mg/dL   Calcium 9.5 8.9 - 10.3 mg/dL   Total Protein 6.9 6.5 - 8.1 g/dL   Albumin 3.5 3.5 - 5.0 g/dL   AST 17 15 - 41 U/L   ALT 16 14 - 54 U/L    Alkaline Phosphatase 86 38 - 126 U/L   Total Bilirubin 0.5 0.3 - 1.2 mg/dL   GFR calc non Af Amer >60 >60 mL/min   GFR calc Af Amer >60 >60 mL/min    Comment: (NOTE) The eGFR has been calculated using the CKD EPI equation. This calculation has not been validated in all clinical situations. eGFR's persistently <60 mL/min signify possible Chronic Kidney Disease.    Anion gap 9 5 - 15  CBC with Differential     Status: Abnormal   Collection Time: 09/11/16 10:30 AM  Result Value Ref Range   WBC 15.4 (H) 4.0 - 10.5 K/uL   RBC 5.02 3.87 - 5.11 MIL/uL   Hemoglobin 15.2 (H) 12.0 - 15.0 g/dL   HCT 45.3 36.0 - 46.0 %   MCV 90.2 78.0 - 100.0 fL   MCH 30.3 26.0 - 34.0 pg   MCHC 33.6 30.0 - 36.0 g/dL   RDW 15.0 11.5 - 15.5 %   Platelets 261 150 - 400 K/uL   Neutrophils Relative % 81 %   Neutro Abs 12.4 (H) 1.7 - 7.7 K/uL   Lymphocytes Relative 8 %   Lymphs Abs 1.3 0.7 - 4.0 K/uL   Monocytes Relative 11 %   Monocytes Absolute 1.6 (H) 0.1 - 1.0 K/uL   Eosinophils Relative 0 %   Eosinophils Absolute 0.0 0.0 - 0.7 K/uL   Basophils Relative 0 %   Basophils Absolute 0.0 0.0 - 0.1 K/uL  Lipase, blood     Status: None   Collection Time: 09/11/16 10:30 AM  Result Value Ref Range   Lipase 29 11 - 51 U/L  I-Stat CG4 Lactic Acid, ED     Status: None   Collection Time: 09/11/16 10:45 AM  Result Value Ref Range   Lactic Acid, Venous 1.71 0.5 - 1.9 mmol/L    Ct Abdomen Pelvis W Contrast  Result Date: 09/11/2016 CLINICAL DATA:  70-year-old female with abdominal, pelvic and flank pain for 1 day. History of lung cancer, appendectomy, cholecystectomy and hysterectomy. EXAM: CT ABDOMEN AND PELVIS WITH CONTRAST TECHNIQUE: Multidetector CT imaging of the abdomen and pelvis was performed using the standard protocol following bolus administration of intravenous contrast. CONTRAST:  100mL ISOVUE-300 IOPAMIDOL (ISOVUE-300) INJECTION 61% COMPARISON:  06/14/2016 and prior exams. FINDINGS: Lower chest: No acute  abnormality. Hepatobiliary: No acute or suspicious hepatic abnormality. The patient is status post cholecystectomy. Pancreas: Unremarkable Spleen: Unremarkable Adrenals/Urinary Tract: The kidneys, adrenal glands and bladder are unremarkable. Stomach/Bowel: Focal diverticulitis of the mid sigmoid colon is noted. A 2.5 x 4.1 cm abscess along the medial aspect of the mid sigmoid colon is noted (image 73). Adjacent foci of free air and along the left pelvic side wall are noted. A   small amount of free complex fluid within the pelvis is present. There is no evidence of bowel obstruction. No other focal areas of bowel wall thickening are present. The appendix is normal. Vascular/Lymphatic: Abdominal aortic atherosclerotic calcifications noted without aneurysm. No enlarged lymph nodes identified. Reproductive: Status post hysterectomy. No adnexal masses. Other: None Musculoskeletal: No acute or suspicious abnormalities. IMPRESSION: Focal diverticulitis of the mid sigmoid colon with adjacent 2.5 x 4.1 cm abscess and foci of pneumoperitoneum in this area. Small amount of complex free fluid within the pelvis. Abdominal aortic atherosclerosis. Electronically Signed   By: Jeffrey  Hu M.D.   On: 09/11/2016 13:38    Review of Systems  Constitutional: Negative for chills, fever and weight loss.  HENT: Negative for hearing loss.   Respiratory: Negative for cough, hemoptysis and shortness of breath.   Cardiovascular: Negative for chest pain and leg swelling.  Gastrointestinal: Positive for abdominal pain. Negative for diarrhea, melena, nausea and vomiting.  Genitourinary: Negative for dysuria and hematuria.  All other systems reviewed and are negative.  Blood pressure (!) 108/44, pulse 80, temperature 98.2 F (36.8 C), temperature source Oral, resp. rate 16, height 5' 2" (1.575 m), weight 77.1 kg (170 lb), SpO2 97 %. Physical Exam  Constitutional: She is oriented to person, place, and time. She appears well-developed  and well-nourished. No distress.  HENT:  Head: Normocephalic and atraumatic.  Right Ear: External ear normal.  Left Ear: External ear normal.  Nose: Nose normal.  Mouth/Throat: Oropharynx is clear and moist. No oropharyngeal exudate.  Eyes: Conjunctivae are normal. Pupils are equal, round, and reactive to light. Right eye exhibits no discharge. Left eye exhibits no discharge. No scleral icterus.  Neck: Normal range of motion. No tracheal deviation present.  Cardiovascular: Normal rate, regular rhythm, normal heart sounds and intact distal pulses.   No murmur heard. Respiratory: Effort normal and breath sounds normal.  GI: Soft. She exhibits no distension. There is tenderness. There is guarding.  There is moderate to severe tenderness with guarding in the left lower quadrant. The rest of the abdomen is nontender  Musculoskeletal: Normal range of motion. She exhibits no edema or tenderness.  Lymphadenopathy:    She has no cervical adenopathy.  Neurological: She is alert and oriented to person, place, and time.  Skin: Skin is warm. No rash noted. She is not diaphoretic. No erythema.  Psychiatric: Her behavior is normal. Judgment normal.    Assessment/Plan: Diverticulitis with abscess  She is significantly tender and there is some free fluid. I discussed the diagnosis with her in detail. I believe is reasonable to try conservative management with IV anabiotic's, bowel rest, and a consult with interventional radiology to see if they can place a percutaneous drain the abscess cavity. Should she acutely worsen, she would require surgery with a bowel resection and colostomy. I discussed the diagnosis with her in detail and the plans and she agrees to proceed with our current plans.  , A 09/11/2016, 4:16 PM     

## 2016-09-11 NOTE — ED Notes (Signed)
Pt was taken to CT while this RN was helping another patient

## 2016-09-11 NOTE — ED Notes (Signed)
Attempted report X1

## 2016-09-11 NOTE — H&P (Signed)
History and Physical    MARCUS GROLL CMK:[id number] DOB: 11/22/1945 DOA: 09/11/2016   PCP: [name], MD   Patient coming from:  Home  Chief Complaint: Right lower abdominal pain   HPI: [name] is a 70 y.o. female with extensive medical history listed below, including  H/o diverticulitis 05/2016  requiring hospitalization,, presenting with acute onset of severe LLQ pain since last night, worse early this morning, 10/10 on arrival , non radiating, worse  with changing position. In addition, she has complaints of dysuria and malodorous urine with some suprapubic discomfort. Denies fevers, chills, night sweats, vision changes, or mucositis. Denies any respiratory complaints at this time but is recovering from recent PNA treated with prednisone and doxycycline 1 week ago by her PCP.  Denies any chest pain or palpitations. Denies lower extremity swelling. Denies nausea, heartburn or change in bowel habits. Appetite is normal.  Denies abnormal skin rashes, or neuropathy. Denies any bleeding issues such as epistaxis, hematemesis, hematuria or hematochezia. Ambulating without difficulty. This week, she denies any other sick contacts. Denies consuming any offending foods to worsen her symptoms.  ED Course:  BP 113/55   Pulse 81   Temp 97.9 F (36.6 C) (Oral)   Resp 22   Ht '5\' 2"'$  (1.575 m)   Wt 77.1 kg (170 lb)   SpO2 98%   BMI 31.09 kg/m    Cement unremarkable. Glucose 204. Creatinine 0.92. Lactic acid 1.71. White count 15.4 Blood culture and urine culture pending. CT of the abdomen and pelvis shows focal diverticulitis of the mid sigmoid colon with adjacent 2.5 x 4.1 cm abscess and foci of pneumoperitoneum in this area. Small amount of complex free fluid within the pelvis. Cultures pending.  Surgery to see.  Received Zosyn IV and IVF   Review of Systems: As per HPI otherwise 10 point review of systems negative.   Past Medical History:  Diagnosis Date  . Age-related nuclear  cataract of both eyes 2016   +cortical age related cataracts OU   . Allergy    seasonal  . Arthritis    hands  . Bilateral diabetic retinopathy (HCC) 2015   Dr. Zigmund Daniel  . Blood transfusion without reported diagnosis    when taking chemo  . CAD (coronary artery disease)    NON obstructive. Cath 2006 preserved LV fxn, scattered irregularities without critical stenosis. 2008 stress echo negative for ischemia, but with hypertensive response  . COPD (chronic obstructive pulmonary disease) ([place])   . Diabetes mellitus with complication (HCC)    diab retinopathy OU (laser)  . Diverticulitis 05/2016  . GERD (gastroesophageal reflux disease)    protonix  . History of diverticulitis of colon   . Hyperlipidemia   . Hypertension   . Lung cancer ([place])    non-small cell lung ca, stage III in 05/2011; systemic chemotherapy concurrent with radiation followed by prophylactic cranial irradiation and has been observation since July of 2010 with no evidence for disease recurrence-released from onc f/u 12/2014 (needs annual cxr by PCP).  CXR 08/2015 stable.  . Mild cognitive impairment with memory loss    Likely from brain radiation therapy  . Open-angle glaucoma 2016   Dr. Shirley Muscat, (bilateral)---responding to topical therapy  . Osteopenia 03/2016   03/2016 DEXA T-score -2.2    Past Surgical History:  Procedure Laterality Date  . ABDOMINAL HYSTERECTOMY  1997  . APPENDECTOMY    . CARPAL TUNNEL RELEASE    . CATARACT EXTRACTION    . CHOLECYSTECTOMY  2002  . COLONOSCOPY  10/24/00   normal.  [employer] hemoccult testing via [place] 06/18/15 was NEG  . LEFT HEART CATHETERIZATION WITH CORONARY ANGIOGRAM N/A 05/30/2012   Procedure: LEFT HEART CATHETERIZATION WITH CORONARY ANGIOGRAM;  Surgeon: Burnell Blanks, MD;  Location: [facility] CATH LAB;  Service: Cardiovascular;  Laterality: N/A;    Social History Social History   Social History  . Marital status: Married    Spouse name: N/A  . Number of children:  N/A  . Years of education: N/A   Occupational History  . Retired Systems analyst Unemployed   Social History Main Topics  . Smoking status: Former Smoker    Packs/day: 1.00    Years: 45.00    Types: Cigarettes    Quit date: 10/24/2008  . Smokeless tobacco: Never Used  . Alcohol use No  . Drug use: No  . Sexual activity: Not on file   Other Topics Concern  . Not on file   Social History Narrative   Lives in [place], has husband and daughter and son. Daughter's family is living with her and husband at present. She is ambulatory daily without cane or walker.      Allergies  Allergen Reactions  . Lactose Intolerance (Gi) Diarrhea  . Ibuprofen     REACTION: Anxiousness, hypoventilate     Family History  Problem Relation Age of Onset  . Heart failure Mother   . Kidney disease Mother     renal failure  . Diabetes Mother   . Diabetes Father   . Diabetes Brother   . Heart disease Brother   . Heart attack Brother   . Heart attack Brother   . Heart attack Brother   . Colon cancer Neg Hx       Prior to Admission medications   Medication Sig Start Date End Date Taking? Authorizing Provider  albuterol (PROVENTIL) (2.5 MG/3ML) 0.083% nebulizer solution USE ONE VIAL IN NEBULIZER EVERY 4 HOURS AS NEEDED FOR WHEEZING OR SHORTNESS OF BREATH 12/16/15   [name], MD  amoxicillin-clavulanate (AUGMENTIN) 875-125 MG tablet Take 1 tablet by mouth 2 (two) times daily. 09/06/16   Historical Provider, MD  aspirin 81 MG tablet Take 81 mg by mouth at bedtime.     Historical Provider, MD  aspirin-acetaminophen-caffeine (EXCEDRIN MIGRAINE) [phone] MG per tablet Take 1 tablet by mouth every 6 (six) hours as needed for headache.    Historical Provider, MD  atorvastatin (LIPITOR) 80 MG tablet TAKE ONE TABLET BY MOUTH ONCE DAILY 07/18/16   [name], MD  cholestyramine (QUESTRAN) 4 g packet Take 1 packet (4 g total) by mouth 2 (two) times daily. 06/20/16 06/25/16  Eugenie Filler,  MD  doxycycline (VIBRA-TABS) 100 MG tablet Take 1 tablet (100 mg total) by mouth 2 (two) times daily. 09/07/16   [name], MD  FLOVENT HFA 220 MCG/ACT inhaler INHALE TWO PUFFS BY MOUTH TWICE DAILY 01/12/16   [name], MD  fluticasone (FLONASE) 50 MCG/ACT nasal spray Place 2 sprays into both nostrils at bedtime. Patient taking differently: Place 2 sprays into both nostrils at bedtime as needed for allergies.  06/25/14   [name], MD  Insulin Isophane & Regular Human (HUMULIN 70/30 KWIKPEN) (70-30) 100 UNIT/ML PEN Inject 16 units subcutaneously before breakfast and 20 units before supper 06/20/16   Eugenie Filler, MD  loratadine (CLARITIN) 10 MG tablet Take 10 mg by mouth at bedtime.     Historical Provider, MD  metFORMIN (  GLUCOPHAGE) 500 MG tablet TAKE ONE TABLET BY MOUTH TWICE DAILY WITH MEALS 01/21/16   [name], MD  nitroGLYCERIN (NITROSTAT) 0.4 MG SL tablet Place 1 tablet (0.4 mg total) under the tongue every 5 (five) minutes as needed. For chest pain 12/31/12   Lisabeth Pick, MD  pantoprazole (PROTONIX) 40 MG tablet TAKE ONE TABLET BY MOUTH ONCE DAILY 10/15/15   [name], MD  prednisoLONE acetate (PRED FORTE) 1 % ophthalmic suspension Place 1 drop into the left eye 4 (four) times daily. 01/18/16   Historical Provider, MD  predniSONE (DELTASONE) 20 MG tablet 2 tabs po qd x 5d 09/07/16   [name], MD  PROAIR HFA 108 [phone] Base) MCG/ACT inhaler  09/06/16   Historical Provider, MD  PROLENSA 0.07 % SOLN Place 1 drop into the left eye at bedtime. 02/22/16   Historical Provider, MD  saccharomyces boulardii (FLORASTOR) 250 MG capsule Take 1 capsule (250 mg total) by mouth 2 (two) times daily. 06/20/16   Eugenie Filler, MD  timolol (BETIMOL) 0.5 % ophthalmic solution Place 1 drop into both eyes every morning.    Historical Provider, MD    Physical Exam:    Vitals:   09/11/16 1245 09/11/16 1337 09/11/16 1345 09/11/16 1400  BP: 108/55 107/55 110/55 113/55    Pulse: 83 81 78 81  Resp: '20 23 21 22  '$ Temp:  97.9 F (36.6 C)    TempSrc:  Oral    SpO2: 97% 98% 98% 98%  Weight:      Height:            Constitutional: NAD, calm, uncomfortable due to LLQ pain  Vitals:   09/11/16 1245 09/11/16 1337 09/11/16 1345 09/11/16 1400  BP: 108/55 107/55 110/55 113/55  Pulse: 83 81 78 81  Resp: '20 23 21 22  '$ Temp:  97.9 F (36.6 C)    TempSrc:  Oral    SpO2: 97% 98% 98% 98%  Weight:      Height:       Eyes: PERRL, lids and conjunctivae normal ENMT: Mucous membranes are moist. Posterior pharynx clear of any exudate or lesions.Normal dentition.  Neck: normal, supple, no masses, no thyromegaly Respiratory: clear to auscultation bilaterally, no wheezing, no crackles. Normal respiratory effort. No accessory muscle use.  Cardiovascular: Regular rate and rhythm, no murmurs / rubs / gallops. No extremity edema. 2+ pedal pulses. No carotid bruits.  Abdomen: Left lower quadrant pain, as well as tenderness to palpation around the suprapubic area, and CVAT, no masses palpated. No hepatosplenomegaly. Bowel sounds positive.  Musculoskeletal: no clubbing / cyanosis. No joint deformity upper and lower extremities. Good ROM, no contractures. Normal muscle tone.  Skin: no rashes, lesions, ulcers.  Neurologic: CN 2-12 grossly intact. Sensation intact, DTR normal. Strength 5/5 in all 4.  Psychiatric: Normal judgment and insight. Alert and oriented x 3. Normal mood.     Labs on Admission: I have personally reviewed following labs and imaging studies  CBC:  Recent Labs Lab 09/11/16 1030  WBC 15.4*  NEUTROABS 12.4*  HGB 15.2*  HCT 45.3  MCV 90.2  PLT 948    Basic Metabolic Panel:  Recent Labs Lab 09/11/16 1030  NA 140  K 3.8  CL 105  CO2 26  GLUCOSE 204*  BUN 16  CREATININE 0.92  CALCIUM 9.5    GFR: Estimated Creatinine Clearance: 56.3 mL/min (by C-G formula based on SCr of 0.92 mg/dL).  Liver Function Tests:  Recent Labs Lab  09/11/16 1030  AST 17  ALT 16  ALKPHOS 86  BILITOT 0.5  PROT 6.9  ALBUMIN 3.5    Recent Labs Lab 09/11/16 1030  LIPASE 29   No results for input(s): AMMONIA in the last 168 hours.  Coagulation Profile: No results for input(s): INR, PROTIME in the last 168 hours.  Cardiac Enzymes: No results for input(s): CKTOTAL, CKMB, CKMBINDEX, TROPONINI in the last 168 hours.  BNP (last 3 results) No results for input(s): PROBNP in the last 8760 hours.  HbA1C: No results for input(s): HGBA1C in the last 72 hours.  CBG: No results for input(s): GLUCAP in the last 168 hours.  Lipid Profile: No results for input(s): CHOL, HDL, LDLCALC, TRIG, CHOLHDL, LDLDIRECT in the last 72 hours.  Thyroid Function Tests: No results for input(s): TSH, T4TOTAL, FREET4, T3FREE, THYROIDAB in the last 72 hours.  Anemia Panel: No results for input(s): VITAMINB12, FOLATE, FERRITIN, TIBC, IRON, RETICCTPCT in the last 72 hours.  Urine analysis:    Component Value Date/Time   COLORURINE YELLOW 06/14/2016 [place] 06/14/2016 1249   LABSPEC 1.019 06/14/2016 1249   PHURINE 8.0 06/14/2016 1249   GLUCOSEU NEGATIVE 06/14/2016 1249   HGBUR NEGATIVE 06/14/2016 1249   BILIRUBINUR NEGATIVE 06/14/2016 1249   KETONESUR NEGATIVE 06/14/2016 1249   PROTEINUR NEGATIVE 06/14/2016 1249   UROBILINOGEN 1.0 02/03/2013 0912   NITRITE NEGATIVE 06/14/2016 1249   LEUKOCYTESUR NEGATIVE 06/14/2016 1249    Sepsis Labs: '@LABRCNTIP'$ (procalcitonin:4,lacticidven:4) )No results found for this or any previous visit (from the past 240 hour(s)).   Radiological Exams on Admission: Ct Abdomen Pelvis W Contrast  Result Date: 09/11/2016 CLINICAL DATA:  70 year old female with abdominal, pelvic and flank pain for 1 day. History of lung cancer, appendectomy, cholecystectomy and hysterectomy. EXAM: CT ABDOMEN AND PELVIS WITH CONTRAST TECHNIQUE: Multidetector CT imaging of the abdomen and pelvis was performed using the  standard protocol following bolus administration of intravenous contrast. CONTRAST:  127m ISOVUE-300 IOPAMIDOL (ISOVUE-300) INJECTION 61% COMPARISON:  06/14/2016 and prior exams. FINDINGS: Lower chest: No acute abnormality. Hepatobiliary: No acute or suspicious hepatic abnormality. The patient is status post cholecystectomy. Pancreas: Unremarkable Spleen: Unremarkable Adrenals/Urinary Tract: The kidneys, adrenal glands and bladder are unremarkable. Stomach/Bowel: Focal diverticulitis of the mid sigmoid colon is noted. A 2.5 x 4.1 cm abscess along the medial aspect of the mid sigmoid colon is noted (image 73). Adjacent foci of free air and along the left pelvic side wall are noted. A small amount of free complex fluid within the pelvis is present. There is no evidence of bowel obstruction. No other focal areas of bowel wall thickening are present. The appendix is normal. Vascular/Lymphatic: Abdominal aortic atherosclerotic calcifications noted without aneurysm. No enlarged lymph nodes identified. Reproductive: Status post hysterectomy. No adnexal masses. Other: None Musculoskeletal: No acute or suspicious abnormalities. IMPRESSION: Focal diverticulitis of the mid sigmoid colon with adjacent 2.5 x 4.1 cm abscess and foci of pneumoperitoneum in this area. Small amount of complex free fluid within the pelvis. Abdominal aortic atherosclerosis. Electronically Signed   By: JMargarette CanadaM.D.   On: 09/11/2016 13:38    EKG: Independently reviewed.  Assessment/Plan Active Problems:   Acute diverticulitis   Diverticulitis large intestine   Small cell lung cancer (HCC)   DM (diabetes mellitus), type 2, uncontrolled with complications (HCC)   Hyperlipidemia, mixed   GERD   COPD (chronic obstructive pulmonary disease) (HCC)   Non-occlusive coronary artery disease   Diverticulitis of sigmoid colon  LLQ Abdominal pain likely due to Acute diverticulitis . Afebrile. WBC 15 (recent prednisone due to COPD  exacerbation) , LA normal. Received IVF and IV Zosyn  Per EDP.  focal diverticulitis of the mid sigmoid colon with adjacent 2.5 x 4.1 cm abscess and foci of pneumoperitoneum in this area. Small amount of complex free fluid within the pelvis. Cultures pending. Surgery to see.  Admit to MedSurg. Clear liquids  Continue IV hydration with normal saline. Continue Zosyn 3.375 g every 8 hours IVPB. Protonix 40 mg IV every 24 hours. Analgesics and antiemetics as needed. Blood cultures Appreciate Surgery involvement   Dysuria with suprapubic pain. No hematuria . UA pending. Afebrile. WBC elevated as above  UA and U culture IV antibiotics as above  IVF   COPD with recent exacerbation in the setting of recent PNA , history of limited stage SCLCa , currently stable  [place]  CXR pending  WBC  15. Was finishing course of doxicycline  Continue nebs and O2 prn  Type II Diabetes Current blood sugar level is 204  Lab Results  Component Value Date   HGBA1C 7.1 (H) 06/14/2016  Hgb A1C Hold home oral diabetic medications.  , SSI Heart healthy carb modified diet.  Hyperlipidemia Continue home statins   GERD, no acute symptoms: Continue PPI    DVT prophylaxis: Lovenox  Code Status:   Full     Family Communication:  Discussed with patient and daughter  Disposition Plan: Expect patient to be discharged to home after condition improves Consults called:    Surgery  Admission status:Medsurg  I  Rondel Jumbo, PA-C [facility]   09/11/2016, 2:45 PM

## 2016-09-11 NOTE — ED Notes (Signed)
Pt tried to get [place] a urine sample, but couldn't go at this time.

## 2016-09-11 NOTE — ED Provider Notes (Signed)
[facility] DEPT Provider Note   CSN: [id number] Arrival date & time: 09/11/16  1017     History   Chief Complaint Chief Complaint  Patient presents with  . Pelvic Pain    HPI SERINITY WARE is a 70 y.o. female with a PMHx of CAD, COPD, DM2, HLD, GERD, diverticulosis, remote lung CA, and mild memory loss, with a PSHx of hysterectomy, appendectomy, and cholecystectomy, who presents to the ED with complaints of she describes the pain as 10/10 constant pain that she is unable to fully describe, across her lower abdomen, nonradiating, worse with position changes, and with no treatments tried prior to arrival. Associated symptoms include dysuria and malodorous urine. She was recently diagnosed with pneumonia last week, is on doxycycline 100 mg twice a day and steroid, she did not take either one this morning. Of note, was in the hospital in 05/2016 for acute diverticulitis.  She denies any recent travel, sick contacts, suspicious food intake, alcohol use, or chronic NSAID use. She denies any fevers, chills, chest pain, shortness breath, nausea, vomiting, diarrhea, constipation, obstipation, melena, hematochezia, hematuria, increased urinary frequency or urgency, vaginal bleeding or discharge, numbness, tingling, or focal weakness.   The history is provided by the patient and medical records. No language interpreter was used.  Abdominal Pain   This is a new problem. The current episode started yesterday. The problem occurs constantly. The problem has not changed since onset.The pain is associated with an unknown factor. The pain is located in the suprapubic region, RLQ and LLQ. Quality: unable to describe. The pain is at a severity of 10/10. The pain is severe. Associated symptoms include dysuria. Pertinent negatives include fever, diarrhea, flatus, hematochezia, melena, nausea, vomiting, constipation, frequency, hematuria, arthralgias and myalgias. The symptoms are aggravated by certain  positions. Nothing relieves the symptoms.    Past Medical History:  Diagnosis Date  . Age-related nuclear cataract of both eyes 2016   +cortical age related cataracts OU   . Allergy    seasonal  . Arthritis    hands  . Bilateral diabetic retinopathy (HCC) 2015   Dr. Zigmund Daniel  . Blood transfusion without reported diagnosis    when taking chemo  . CAD (coronary artery disease)    NON obstructive. Cath 2006 preserved LV fxn, scattered irregularities without critical stenosis. 2008 stress echo negative for ischemia, but with hypertensive response  . COPD (chronic obstructive pulmonary disease) ([place])   . Diabetes mellitus with complication (HCC)    diab retinopathy OU (laser)  . Diverticulitis 05/2016  . GERD (gastroesophageal reflux disease)    protonix  . History of diverticulitis of colon   . Hyperlipidemia   . Lung cancer ([place])    non-small cell lung ca, stage III in 05/2011; systemic chemotherapy concurrent with radiation followed by prophylactic cranial irradiation and has been observation since July of 2010 with no evidence for disease recurrence-released from onc f/u 12/2014 (needs annual cxr by PCP).  CXR 08/2015 stable.  . Mild cognitive impairment with memory loss    Likely from brain radiation therapy  . Open-angle glaucoma 2016   Dr. Shirley Muscat, (bilateral)---responding to topical therapy  . Osteopenia 03/2016   03/2016 DEXA T-score -2.2    Patient Active Problem List   Diagnosis Date Noted  . Debilitated patient 07/08/2016  . Diverticulitis of sigmoid colon 06/15/2016  . Arterial hypotension   . Acute diverticulitis 06/14/2016  . Sepsis ([place]) 06/14/2016  . Controlled type 2 diabetes mellitus with hyperglycemia, with long-term current use  of insulin ([place]) 06/14/2016  . Hyperlipidemia 06/14/2016  . Diverticulitis of intestine without perforation or abscess without bleeding   . Non-occlusive coronary artery disease 06/06/2012  . COPD (chronic obstructive pulmonary  disease) ([place]) 02/14/2011  . TOBACCO USE, QUIT 07/29/2009  . Small cell lung cancer ([place]) 01/18/2009  . Hyperlipidemia, mixed 05/22/2007  . DM (diabetes mellitus), type 2, uncontrolled with complications ([place]) 54/27/0623  . GERD 04/24/2007    Past Surgical History:  Procedure Laterality Date  . ABDOMINAL HYSTERECTOMY  1997  . APPENDECTOMY    . CARPAL TUNNEL RELEASE    . CATARACT EXTRACTION    . CHOLECYSTECTOMY  2002  . COLONOSCOPY  10/24/00   normal.  [employer] hemoccult testing via [place] 06/18/15 was NEG  . LEFT HEART CATHETERIZATION WITH CORONARY ANGIOGRAM N/A 05/30/2012   Procedure: LEFT HEART CATHETERIZATION WITH CORONARY ANGIOGRAM;  Surgeon: Burnell Blanks, MD;  Location: [facility] CATH LAB;  Service: Cardiovascular;  Laterality: N/A;    OB History    No data available       Home Medications    Prior to Admission medications   Medication Sig Start Date End Date Taking? Authorizing Provider  albuterol (PROVENTIL) (2.5 MG/3ML) 0.083% nebulizer solution USE ONE VIAL IN NEBULIZER EVERY 4 HOURS AS NEEDED FOR WHEEZING OR SHORTNESS OF BREATH 12/16/15   [name], MD  aspirin 81 MG tablet Take 81 mg by mouth at bedtime.     Historical Provider, MD  aspirin-acetaminophen-caffeine (EXCEDRIN MIGRAINE) [phone] MG per tablet Take 1 tablet by mouth every 6 (six) hours as needed for headache.    Historical Provider, MD  atorvastatin (LIPITOR) 80 MG tablet TAKE ONE TABLET BY MOUTH ONCE DAILY 07/18/16   [name], MD  cholestyramine (QUESTRAN) 4 g packet Take 1 packet (4 g total) by mouth 2 (two) times daily. 06/20/16 06/25/16  Eugenie Filler, MD  doxycycline (VIBRA-TABS) 100 MG tablet Take 1 tablet (100 mg total) by mouth 2 (two) times daily. 09/07/16   [name], MD  FLOVENT HFA 220 MCG/ACT inhaler INHALE TWO PUFFS BY MOUTH TWICE DAILY 01/12/16   [name], MD  fluticasone (FLONASE) 50 MCG/ACT nasal spray Place 2 sprays into both nostrils at bedtime. Patient  taking differently: Place 2 sprays into both nostrils at bedtime as needed for allergies.  06/25/14   [name], MD  glucose blood (ONE TOUCH ULTRA TEST) test strip TEST 3 TIMES A DAY 02/18/16   [name], MD  Insulin Isophane & Regular Human (HUMULIN 70/30 KWIKPEN) (70-30) 100 UNIT/ML PEN Inject 16 units subcutaneously before breakfast and 20 units before supper 06/20/16   Eugenie Filler, MD  loratadine (CLARITIN) 10 MG tablet Take 10 mg by mouth at bedtime.     Historical Provider, MD  metFORMIN (GLUCOPHAGE) 500 MG tablet TAKE ONE TABLET BY MOUTH TWICE DAILY WITH MEALS 01/21/16   [name], MD  nitroGLYCERIN (NITROSTAT) 0.4 MG SL tablet Place 1 tablet (0.4 mg total) under the tongue every 5 (five) minutes as needed. For chest pain 12/31/12   Lisabeth Pick, MD  [facility] DELICA LANCETS 76H MISC TEST 3 TIMES A DAY 02/18/16   [name], MD  pantoprazole (PROTONIX) 40 MG tablet TAKE ONE TABLET BY MOUTH ONCE DAILY 10/15/15   [name], MD  prednisoLONE acetate (PRED FORTE) 1 % ophthalmic suspension Place 1 drop into the left eye 4 (four) times daily. 01/18/16   Historical Provider, MD  predniSONE (  DELTASONE) 20 MG tablet 2 tabs po qd x 5d 09/07/16   [name], MD  PROLENSA 0.07 % SOLN Place 1 drop into the left eye at bedtime. 02/22/16   Historical Provider, MD  saccharomyces boulardii (FLORASTOR) 250 MG capsule Take 1 capsule (250 mg total) by mouth 2 (two) times daily. 06/20/16   Eugenie Filler, MD  timolol (BETIMOL) 0.5 % ophthalmic solution Place 1 drop into both eyes every morning.    Historical Provider, MD    Family History Family History  Problem Relation Age of Onset  . Heart failure Mother   . Kidney disease Mother     renal failure  . Diabetes Mother   . Diabetes Father   . Diabetes Brother   . Heart disease Brother   . Heart attack Brother   . Heart attack Brother   . Heart attack Brother   . Colon cancer Neg Hx     Social History Social  History  Substance Use Topics  . Smoking status: Former Smoker    Packs/day: 1.00    Years: 45.00    Types: Cigarettes    Quit date: 10/24/2008  . Smokeless tobacco: Never Used  . Alcohol use No     Allergies   Lactose intolerance (gi) and Ibuprofen   Review of Systems Review of Systems  Constitutional: Negative for chills and fever.  Respiratory: Negative for shortness of breath.   Cardiovascular: Negative for chest pain.  Gastrointestinal: Positive for abdominal pain. Negative for blood in stool, constipation, diarrhea, flatus, hematochezia, melena, nausea and vomiting.  Genitourinary: Positive for dysuria and pelvic pain. Negative for frequency, hematuria, vaginal bleeding and vaginal discharge.       +malodorous urine  Musculoskeletal: Negative for arthralgias and myalgias.  Skin: Negative for color change.  Allergic/Immunologic: Positive for immunocompromised state (diabetic).  Neurological: Negative for weakness and numbness.  Psychiatric/Behavioral: Negative for confusion.   10 Systems reviewed and are negative for acute change except as noted in the HPI.   Physical Exam Updated Vital Signs BP (!) 90/37 (BP Location: Left Arm)   Pulse 96   Temp 97.6 F (36.4 C) (Oral)   Resp 18   SpO2 98%   Physical Exam  Constitutional: She is oriented to person, place, and time. Vital signs are normal. She appears well-developed and well-nourished.  Non-toxic appearance. No distress.  Afebrile, nontoxic, NAD although appears tired  HENT:  Head: Normocephalic and atraumatic.  Mouth/Throat: Oropharynx is clear and moist and mucous membranes are normal.  Eyes: Conjunctivae and EOM are normal. Right eye exhibits no discharge. Left eye exhibits no discharge.  Neck: Normal range of motion. Neck supple.  Cardiovascular: Normal rate, regular rhythm, normal heart sounds and intact distal pulses.  Exam reveals no gallop and no friction rub.   No murmur heard. Pulmonary/Chest: Effort  normal and breath sounds normal. No respiratory distress. She has no decreased breath sounds. She has no wheezes. She has no rhonchi. She has no rales.  Abdominal: Soft. Normal appearance and bowel sounds are normal. She exhibits no distension. There is generalized tenderness. There is CVA tenderness. There is no rigidity, no rebound, no guarding, no tenderness at McBurney's point and negative Murphy's sign.  Soft, nondistended, +BS throughout, with mild generalized TTP throughout but most focally and more moderately across the lower abdomen, no r/g/r, neg murphy's, neg mcburney's, with mild b/l CVA TTP   Musculoskeletal: Normal range of motion.  Neurological: She is alert and oriented to person, place,  and time. She has normal strength. No sensory deficit.  Skin: Skin is warm, dry and intact. No rash noted.  Psychiatric: She has a normal mood and affect.  Nursing note and vitals reviewed.    ED Treatments / Results  Labs (all labs ordered are listed, but only abnormal results are displayed) Labs Reviewed  COMPREHENSIVE METABOLIC PANEL - Abnormal; Notable for the following:       Result Value   Glucose, Bld 204 (*)    All other components within normal limits  CBC WITH DIFFERENTIAL/PLATELET - Abnormal; Notable for the following:    WBC 15.4 (*)    Hemoglobin 15.2 (*)    Neutro Abs 12.4 (*)    Monocytes Absolute 1.6 (*)    All other components within normal limits  URINE CULTURE  CULTURE, BLOOD (ROUTINE X 2)  CULTURE, BLOOD (ROUTINE X 2)  LIPASE, BLOOD  URINALYSIS, ROUTINE W REFLEX MICROSCOPIC (NOT AT [facility])  I-STAT CG4 LACTIC ACID, ED    EKG  EKG Interpretation None       Radiology Ct Abdomen Pelvis W Contrast  Result Date: 09/11/2016 CLINICAL DATA:  70 year old female with abdominal, pelvic and flank pain for 1 day. History of lung cancer, appendectomy, cholecystectomy and hysterectomy. EXAM: CT ABDOMEN AND PELVIS WITH CONTRAST TECHNIQUE: Multidetector CT imaging of the  abdomen and pelvis was performed using the standard protocol following bolus administration of intravenous contrast. CONTRAST:  150m ISOVUE-300 IOPAMIDOL (ISOVUE-300) INJECTION 61% COMPARISON:  06/14/2016 and prior exams. FINDINGS: Lower chest: No acute abnormality. Hepatobiliary: No acute or suspicious hepatic abnormality. The patient is status post cholecystectomy. Pancreas: Unremarkable Spleen: Unremarkable Adrenals/Urinary Tract: The kidneys, adrenal glands and bladder are unremarkable. Stomach/Bowel: Focal diverticulitis of the mid sigmoid colon is noted. A 2.5 x 4.1 cm abscess along the medial aspect of the mid sigmoid colon is noted (image 73). Adjacent foci of free air and along the left pelvic side wall are noted. A small amount of free complex fluid within the pelvis is present. There is no evidence of bowel obstruction. No other focal areas of bowel wall thickening are present. The appendix is normal. Vascular/Lymphatic: Abdominal aortic atherosclerotic calcifications noted without aneurysm. No enlarged lymph nodes identified. Reproductive: Status post hysterectomy. No adnexal masses. Other: None Musculoskeletal: No acute or suspicious abnormalities. IMPRESSION: Focal diverticulitis of the mid sigmoid colon with adjacent 2.5 x 4.1 cm abscess and foci of pneumoperitoneum in this area. Small amount of complex free fluid within the pelvis. Abdominal aortic atherosclerosis. Electronically Signed   By: JMargarette CanadaM.D.   On: 09/11/2016 13:38    Procedures Procedures (including critical care time)  Medications Ordered in ED Medications  0.9 %  sodium chloride infusion (not administered)  sodium chloride 0.9 % bolus 1,000 mL (1,000 mLs Intravenous New Bag/Given 09/11/16 1146)    And  sodium chloride 0.9 % bolus 1,000 mL (1,000 mLs Intravenous New Bag/Given 09/11/16 1200)    And  sodium chloride 0.9 % bolus 250 mL (not administered)  piperacillin-tazobactam (ZOSYN) IVPB 3.375 g (not administered)    fentaNYL (SUBLIMAZE) injection 50 mcg (not administered)  fentaNYL (SUBLIMAZE) injection 50 mcg (50 mcg Intravenous Given 09/11/16 1149)  iopamidol (ISOVUE-300) 61 % injection (100 mLs  Contrast Given 09/11/16 1308)     Initial Impression / Assessment and Plan / ED Course  I have reviewed the triage vital signs and the nursing notes.  Pertinent labs & imaging results that were available during my care of the patient were reviewed by  me and considered in my medical decision making (see chart for details).  Clinical Course     70 y.o. female here with lower abd pain x1 day, also with some dysuria and malodorous urine. On exam, diffuse abd TTP most focally in the lower abdomen, some mild flank pain bilaterally as well. Mildly low BP but has been seen in the past. Afebrile. CBC w/diff showing WBC 15.4 but very hemoconcentrated so that could explain these results; lactic WNL. Awaiting remaining work up, CMP in process. Will add-on lipase and BCx, will await U/A and UCx to be collected. Will also get CT abd/pelv. Will give fluids, fentanyl for pain, and reassess after work up. Doubt code sepsis, will hold off on calling this at this time, given neg lactic acid level, but will start weight based fluids and reassess shortly.  1:36 PM CMP WNL. Lipase neg. Pt in CT, will reassess after results return. Still awaiting U/A. Of note, BP improving with fluids. Will reassess shortly  1:48 PM CT showing sigmoid diverticulitis with abscess and pneumoperitoneum in the area, with free fluid in the pelvis. Will start zosyn and consult for admission. Pt states pain still present although fentanyl helped, will repeat dose of fentanyl. Pt and family deny any other needs at this time, understand plan. Discussed case with my attending Dr. Rogene Houston who agrees with plan.   1:56 PM Sharene Butters of [facility] returning page and will admit. Holding orders placed. Would like me to contact general surgery for discussion of care,  will consult now. Please see their notes for further documentation of care. I appreciate their help with this pleasant pt's care. Pt stable at time of admission. Of note, U/A not yet done at time of admission, will ensure this is obtained, see admit notes for documentation of results.    Final Clinical Impressions(s) / ED Diagnoses   Final diagnoses:  Lower abdominal pain  Dysuria  Diverticulitis of large intestine with perforation and abscess, unspecified bleeding status    New Prescriptions New Prescriptions   No medications on file     Zacarias Pontes, PA-C 09/11/16 1359    Fredia Sorrow, MD 09/11/16 1436

## 2016-09-11 NOTE — ED Notes (Signed)
Pt still unable to give [place] a urine sample.

## 2016-09-12 ENCOUNTER — Observation Stay (HOSPITAL_COMMUNITY): Payer: Medicare Other

## 2016-09-12 DIAGNOSIS — J449 Chronic obstructive pulmonary disease, unspecified: Secondary | ICD-10-CM | POA: Diagnosis not present

## 2016-09-12 DIAGNOSIS — K5780 Diverticulitis of intestine, part unspecified, with perforation and abscess without bleeding: Secondary | ICD-10-CM | POA: Diagnosis not present

## 2016-09-12 DIAGNOSIS — I5023 Acute on chronic systolic (congestive) heart failure: Secondary | ICD-10-CM | POA: Diagnosis not present

## 2016-09-12 DIAGNOSIS — K5781 Diverticulitis of intestine, part unspecified, with perforation and abscess with bleeding: Secondary | ICD-10-CM | POA: Diagnosis not present

## 2016-09-12 DIAGNOSIS — E2740 Unspecified adrenocortical insufficiency: Secondary | ICD-10-CM | POA: Diagnosis not present

## 2016-09-12 DIAGNOSIS — Z85118 Personal history of other malignant neoplasm of bronchus and lung: Secondary | ICD-10-CM | POA: Diagnosis not present

## 2016-09-12 DIAGNOSIS — I63412 Cerebral infarction due to embolism of left middle cerebral artery: Secondary | ICD-10-CM | POA: Diagnosis not present

## 2016-09-12 DIAGNOSIS — R531 Weakness: Secondary | ICD-10-CM | POA: Diagnosis not present

## 2016-09-12 DIAGNOSIS — R1312 Dysphagia, oropharyngeal phase: Secondary | ICD-10-CM | POA: Diagnosis not present

## 2016-09-12 DIAGNOSIS — H4010X0 Unspecified open-angle glaucoma, stage unspecified: Secondary | ICD-10-CM | POA: Diagnosis present

## 2016-09-12 DIAGNOSIS — R05 Cough: Secondary | ICD-10-CM | POA: Diagnosis not present

## 2016-09-12 DIAGNOSIS — M6281 Muscle weakness (generalized): Secondary | ICD-10-CM | POA: Diagnosis not present

## 2016-09-12 DIAGNOSIS — Z452 Encounter for adjustment and management of vascular access device: Secondary | ICD-10-CM | POA: Diagnosis not present

## 2016-09-12 DIAGNOSIS — M19041 Primary osteoarthritis, right hand: Secondary | ICD-10-CM | POA: Diagnosis present

## 2016-09-12 DIAGNOSIS — I679 Cerebrovascular disease, unspecified: Secondary | ICD-10-CM | POA: Diagnosis not present

## 2016-09-12 DIAGNOSIS — I5181 Takotsubo syndrome: Secondary | ICD-10-CM | POA: Diagnosis not present

## 2016-09-12 DIAGNOSIS — I10 Essential (primary) hypertension: Secondary | ICD-10-CM | POA: Diagnosis not present

## 2016-09-12 DIAGNOSIS — E118 Type 2 diabetes mellitus with unspecified complications: Secondary | ICD-10-CM

## 2016-09-12 DIAGNOSIS — R0902 Hypoxemia: Secondary | ICD-10-CM | POA: Diagnosis not present

## 2016-09-12 DIAGNOSIS — Z9221 Personal history of antineoplastic chemotherapy: Secondary | ICD-10-CM | POA: Diagnosis not present

## 2016-09-12 DIAGNOSIS — I130 Hypertensive heart and chronic kidney disease with heart failure and stage 1 through stage 4 chronic kidney disease, or unspecified chronic kidney disease: Secondary | ICD-10-CM | POA: Diagnosis present

## 2016-09-12 DIAGNOSIS — K219 Gastro-esophageal reflux disease without esophagitis: Secondary | ICD-10-CM | POA: Diagnosis present

## 2016-09-12 DIAGNOSIS — I959 Hypotension, unspecified: Secondary | ICD-10-CM | POA: Diagnosis not present

## 2016-09-12 DIAGNOSIS — I151 Hypertension secondary to other renal disorders: Secondary | ICD-10-CM | POA: Diagnosis not present

## 2016-09-12 DIAGNOSIS — I6502 Occlusion and stenosis of left vertebral artery: Secondary | ICD-10-CM | POA: Diagnosis not present

## 2016-09-12 DIAGNOSIS — I4901 Ventricular fibrillation: Secondary | ICD-10-CM | POA: Diagnosis not present

## 2016-09-12 DIAGNOSIS — I2510 Atherosclerotic heart disease of native coronary artery without angina pectoris: Secondary | ICD-10-CM | POA: Diagnosis not present

## 2016-09-12 DIAGNOSIS — E11319 Type 2 diabetes mellitus with unspecified diabetic retinopathy without macular edema: Secondary | ICD-10-CM | POA: Diagnosis present

## 2016-09-12 DIAGNOSIS — R0602 Shortness of breath: Secondary | ICD-10-CM | POA: Diagnosis not present

## 2016-09-12 DIAGNOSIS — R300 Dysuria: Secondary | ICD-10-CM | POA: Diagnosis present

## 2016-09-12 DIAGNOSIS — E1165 Type 2 diabetes mellitus with hyperglycemia: Secondary | ICD-10-CM | POA: Diagnosis not present

## 2016-09-12 DIAGNOSIS — I6340 Cerebral infarction due to embolism of unspecified cerebral artery: Secondary | ICD-10-CM | POA: Diagnosis not present

## 2016-09-12 DIAGNOSIS — I472 Ventricular tachycardia: Secondary | ICD-10-CM | POA: Diagnosis not present

## 2016-09-12 DIAGNOSIS — J438 Other emphysema: Secondary | ICD-10-CM | POA: Diagnosis not present

## 2016-09-12 DIAGNOSIS — I639 Cerebral infarction, unspecified: Secondary | ICD-10-CM | POA: Diagnosis not present

## 2016-09-12 DIAGNOSIS — N179 Acute kidney failure, unspecified: Secondary | ICD-10-CM | POA: Diagnosis not present

## 2016-09-12 DIAGNOSIS — K5720 Diverticulitis of large intestine with perforation and abscess without bleeding: Secondary | ICD-10-CM | POA: Diagnosis not present

## 2016-09-12 DIAGNOSIS — E1122 Type 2 diabetes mellitus with diabetic chronic kidney disease: Secondary | ICD-10-CM | POA: Diagnosis present

## 2016-09-12 DIAGNOSIS — J90 Pleural effusion, not elsewhere classified: Secondary | ICD-10-CM | POA: Diagnosis not present

## 2016-09-12 DIAGNOSIS — E119 Type 2 diabetes mellitus without complications: Secondary | ICD-10-CM | POA: Diagnosis not present

## 2016-09-12 DIAGNOSIS — E872 Acidosis: Secondary | ICD-10-CM | POA: Diagnosis not present

## 2016-09-12 DIAGNOSIS — K651 Peritoneal abscess: Secondary | ICD-10-CM | POA: Diagnosis not present

## 2016-09-12 DIAGNOSIS — I25119 Atherosclerotic heart disease of native coronary artery with unspecified angina pectoris: Secondary | ICD-10-CM | POA: Diagnosis not present

## 2016-09-12 DIAGNOSIS — M19042 Primary osteoarthritis, left hand: Secondary | ICD-10-CM | POA: Diagnosis present

## 2016-09-12 DIAGNOSIS — K5732 Diverticulitis of large intestine without perforation or abscess without bleeding: Secondary | ICD-10-CM

## 2016-09-12 DIAGNOSIS — Z923 Personal history of irradiation: Secondary | ICD-10-CM | POA: Diagnosis not present

## 2016-09-12 DIAGNOSIS — J9600 Acute respiratory failure, unspecified whether with hypoxia or hypercapnia: Secondary | ICD-10-CM | POA: Diagnosis not present

## 2016-09-12 DIAGNOSIS — R2689 Other abnormalities of gait and mobility: Secondary | ICD-10-CM | POA: Diagnosis not present

## 2016-09-12 DIAGNOSIS — I469 Cardiac arrest, cause unspecified: Secondary | ICD-10-CM | POA: Diagnosis not present

## 2016-09-12 DIAGNOSIS — R079 Chest pain, unspecified: Secondary | ICD-10-CM | POA: Diagnosis not present

## 2016-09-12 DIAGNOSIS — R918 Other nonspecific abnormal finding of lung field: Secondary | ICD-10-CM | POA: Diagnosis not present

## 2016-09-12 DIAGNOSIS — I420 Dilated cardiomyopathy: Secondary | ICD-10-CM | POA: Diagnosis present

## 2016-09-12 DIAGNOSIS — J9601 Acute respiratory failure with hypoxia: Secondary | ICD-10-CM | POA: Diagnosis not present

## 2016-09-12 LAB — GLUCOSE, CAPILLARY
GLUCOSE-CAPILLARY: 129 mg/dL — AB (ref 65–99)
GLUCOSE-CAPILLARY: 318 mg/dL — AB (ref 65–99)
Glucose-Capillary: 125 mg/dL — ABNORMAL HIGH (ref 65–99)
Glucose-Capillary: 283 mg/dL — ABNORMAL HIGH (ref 65–99)

## 2016-09-12 LAB — COMPREHENSIVE METABOLIC PANEL
ALT: 13 U/L — AB (ref 14–54)
AST: 14 U/L — AB (ref 15–41)
Albumin: 2.6 g/dL — ABNORMAL LOW (ref 3.5–5.0)
Alkaline Phosphatase: 67 U/L (ref 38–126)
Anion gap: 5 (ref 5–15)
BILIRUBIN TOTAL: 0.7 mg/dL (ref 0.3–1.2)
BUN: 10 mg/dL (ref 6–20)
CHLORIDE: 111 mmol/L (ref 101–111)
CO2: 23 mmol/L (ref 22–32)
CREATININE: 0.7 mg/dL (ref 0.44–1.00)
Calcium: 7.9 mg/dL — ABNORMAL LOW (ref 8.9–10.3)
Glucose, Bld: 112 mg/dL — ABNORMAL HIGH (ref 65–99)
Potassium: 3.2 mmol/L — ABNORMAL LOW (ref 3.5–5.1)
Sodium: 139 mmol/L (ref 135–145)
TOTAL PROTEIN: 5.2 g/dL — AB (ref 6.5–8.1)

## 2016-09-12 LAB — HEMOGLOBIN A1C
HEMOGLOBIN A1C: 7.1 % — AB (ref 4.8–5.6)
MEAN PLASMA GLUCOSE: 157 mg/dL

## 2016-09-12 LAB — CBC
HCT: 36.2 % (ref 36.0–46.0)
Hemoglobin: 11.9 g/dL — ABNORMAL LOW (ref 12.0–15.0)
MCH: 29.8 pg (ref 26.0–34.0)
MCHC: 32.9 g/dL (ref 30.0–36.0)
MCV: 90.5 fL (ref 78.0–100.0)
PLATELETS: 186 10*3/uL (ref 150–400)
RBC: 4 MIL/uL (ref 3.87–5.11)
RDW: 15.1 % (ref 11.5–15.5)
WBC: 11 10*3/uL — AB (ref 4.0–10.5)

## 2016-09-12 LAB — URINE CULTURE

## 2016-09-12 LAB — PROTIME-INR
INR: 1.34
PROTHROMBIN TIME: 16.7 s — AB (ref 11.4–15.2)

## 2016-09-12 MED ORDER — FENTANYL CITRATE (PF) 100 MCG/2ML IJ SOLN
INTRAMUSCULAR | Status: AC | PRN
  Administered 2016-09-12 (×2): 25 ug via INTRAVENOUS

## 2016-09-12 MED ORDER — POTASSIUM CHLORIDE CRYS ER 20 MEQ PO TBCR
40.0000 meq | EXTENDED_RELEASE_TABLET | Freq: Two times a day (BID) | ORAL | Status: AC
  Administered 2016-09-12 (×2): 40 meq via ORAL
  Filled 2016-09-12 (×2): qty 2

## 2016-09-12 MED ORDER — MORPHINE SULFATE (PF) 4 MG/ML IV SOLN: 4.0000 mg | INTRAVENOUS | Status: DC | PRN

## 2016-09-12 MED ORDER — INSULIN ASPART 100 UNIT/ML ~~LOC~~ SOLN
0.0000 [IU] | Freq: Three times a day (TID) | SUBCUTANEOUS | Status: DC
  Administered 2016-09-12: 7 [IU] via SUBCUTANEOUS
  Administered 2016-09-13: 2 [IU] via SUBCUTANEOUS
  Administered 2016-09-13: 3 [IU] via SUBCUTANEOUS
  Administered 2016-09-13: 5 [IU] via SUBCUTANEOUS

## 2016-09-12 MED ORDER — MIDAZOLAM HCL 2 MG/2ML IJ SOLN
INTRAMUSCULAR | Status: AC | PRN
  Administered 2016-09-12: 0.5 mg via INTRAVENOUS
  Administered 2016-09-12: 1 mg via INTRAVENOUS

## 2016-09-12 MED ORDER — INSULIN GLARGINE 100 UNIT/ML ~~LOC~~ SOLN
5.0000 [IU] | Freq: Every day | SUBCUTANEOUS | Status: DC
  Administered 2016-09-12 – 2016-09-13 (×2): 5 [IU] via SUBCUTANEOUS
  Filled 2016-09-12 (×2): qty 0.05

## 2016-09-12 MED ORDER — PREDNISONE 20 MG PO TABS
30.0000 mg | ORAL_TABLET | Freq: Every day | ORAL | Status: DC
  Administered 2016-09-13: 30 mg via ORAL
  Filled 2016-09-12: qty 1

## 2016-09-12 MED ORDER — KETOROLAC TROMETHAMINE 0.5 % OP SOLN
1.0000 [drp] | Freq: Four times a day (QID) | OPHTHALMIC | Status: DC
  Administered 2016-09-12 – 2016-09-27 (×55): 1 [drp] via OPHTHALMIC
  Filled 2016-09-12 (×3): qty 3

## 2016-09-12 MED ORDER — MIDAZOLAM HCL 2 MG/2ML IJ SOLN
INTRAMUSCULAR | Status: AC
  Filled 2016-09-12: qty 4

## 2016-09-12 MED ORDER — FENTANYL CITRATE (PF) 100 MCG/2ML IJ SOLN
INTRAMUSCULAR | Status: AC
  Filled 2016-09-12: qty 4

## 2016-09-12 MED ORDER — MORPHINE SULFATE (PF) 2 MG/ML IV SOLN
2.0000 mg | INTRAVENOUS | Status: DC | PRN
  Administered 2016-09-13 (×2): 2 mg via INTRAVENOUS
  Filled 2016-09-12 (×2): qty 1

## 2016-09-12 MED ORDER — FLUTICASONE PROPIONATE 50 MCG/ACT NA SUSP
2.0000 | Freq: Every day | NASAL | Status: DC
  Administered 2016-09-12 – 2016-09-13 (×2): 2 via NASAL
  Filled 2016-09-12: qty 16

## 2016-09-12 MED ORDER — LIDOCAINE HCL 1 % IJ SOLN
INTRAMUSCULAR | Status: AC
  Filled 2016-09-12: qty 20

## 2016-09-12 NOTE — Procedures (Signed)
Interventional Radiology Procedure Note  Procedure:  CT guided drainage of diverticular abscess  Complications: None  Estimated Blood Loss: < 10 mL  Sigmoid diverticular abscess able to be approached from anterior midline window.  Purulent, bloody fluid aspirated and sample sent for culture. 10 Fr drain placed and attached to suction bulb drainage. Will follow.  Venetia Night. Kathlene Cote, M.D Pager:  [phone]

## 2016-09-12 NOTE — Progress Notes (Signed)
PT Cancellation Note  Patient Details Name: [name] MRN: [id number] DOB: 1945-12-11   Cancelled Treatment:    Reason Eval/Treat Not Completed: Patient at procedure or test/unavailable. Check back another time.   Marcelino Freestone PT [phone]  09/12/2016, 11:21 AM

## 2016-09-12 NOTE — Consult Note (Signed)
Chief Complaint: Patient was seen in consultation today for left lower abdomen drain placement at the request of Dr. Loma Boston  Referring Physician(s): Dr. Loma Boston, Dr. Unknown Jim  Supervising Physician: Aletta Edouard  Patient Status: [facility] - In-pt  History of Present Illness: [name] is a 70 y.o. female  Patient with history of diverticulitis in August 2017. Presented to [facility] with worsening LLQ pain.  Found to have focal diverticulitis mid sigmoid colon with 4.1 x 2.5 cm abscess.  CT Abdomen/Pelvis  (09/12/16) IMPRESSION: Focal diverticulitis of the mid sigmoid colon with adjacent 2.5 x 4.1 cm abscess and foci of pneumoperitoneum in this area. Small amount of complex free fluid within the pelvis.  Request drain placement per Dr. Rush Farmer. Reviewed by Dr. Kathlene Cote; ok to proceed with attempt at drain placement.   Patient is NPO No blood-thinners.   Past Medical History:  Diagnosis Date  . Age-related nuclear cataract of both eyes 2016   +cortical age related cataracts OU   . Allergy    seasonal  . Arthritis    hands  . Bilateral diabetic retinopathy (HCC) 2015   Dr. Zigmund Daniel  . Blood transfusion without reported diagnosis    when taking chemo  . CAD (coronary artery disease)    NON obstructive. Cath 2006 preserved LV fxn, scattered irregularities without critical stenosis. 2008 stress echo negative for ischemia, but with hypertensive response  . COPD (chronic obstructive pulmonary disease) ([place])   . Diabetes mellitus with complication (HCC)    diab retinopathy OU (laser)  . Diverticulitis 05/2016  . GERD (gastroesophageal reflux disease)    protonix  . History of diverticulitis of colon   . Hyperlipidemia   . Hypertension   . Lung cancer ([place])    non-small cell lung ca, stage III in 05/2011; systemic chemotherapy concurrent with radiation followed by prophylactic cranial irradiation and has been observation since July of 2010 with no  evidence for disease recurrence-released from onc f/u 12/2014 (needs annual cxr by PCP).  CXR 08/2015 stable.  . Mild cognitive impairment with memory loss    Likely from brain radiation therapy  . Open-angle glaucoma 2016   Dr. Shirley Muscat, (bilateral)---responding to topical therapy  . Osteopenia 03/2016   03/2016 DEXA T-score -2.2    Past Surgical History:  Procedure Laterality Date  . ABDOMINAL HYSTERECTOMY  1997  . APPENDECTOMY    . CARPAL TUNNEL RELEASE    . CATARACT EXTRACTION    . CHOLECYSTECTOMY  2002  . COLONOSCOPY  10/24/00   normal.  [employer] hemoccult testing via [place] 06/18/15 was NEG  . LEFT HEART CATHETERIZATION WITH CORONARY ANGIOGRAM N/A 05/30/2012   Procedure: LEFT HEART CATHETERIZATION WITH CORONARY ANGIOGRAM;  Surgeon: Burnell Blanks, MD;  Location: [facility] CATH LAB;  Service: Cardiovascular;  Laterality: N/A;    Allergies: Lactose intolerance (gi) and Ibuprofen  Medications: Prior to Admission medications   Medication Sig Start Date End Date Taking? Authorizing Provider  acetaminophen (TYLENOL) 500 MG tablet Take 1,000 mg by mouth every 6 (six) hours as needed for headache (pain).   Yes Historical Provider, MD  albuterol (PROAIR HFA) 108 (90 Base) MCG/ACT inhaler Inhale 2 puffs into the lungs every 6 (six) hours as needed for wheezing or shortness of breath.   Yes Historical Provider, MD  albuterol (PROVENTIL) (2.5 MG/3ML) 0.083% nebulizer solution USE ONE VIAL IN NEBULIZER EVERY 4 HOURS AS NEEDED FOR WHEEZING OR SHORTNESS OF BREATH 12/16/15  Yes [name], MD  aspirin  EC 81 MG tablet Take 81 mg by mouth at bedtime.   Yes Historical Provider, MD  aspirin-acetaminophen-caffeine (EXCEDRIN MIGRAINE) [phone] MG per tablet Take 1 tablet by mouth every 6 (six) hours as needed for headache.   Yes Historical Provider, MD  atorvastatin (LIPITOR) 80 MG tablet TAKE ONE TABLET BY MOUTH ONCE DAILY Patient taking differently: TAKE ONE TABLET BY MOUTH ONCE DAILY AT BEDTIME  07/18/16  Yes [name], MD  Bromfenac Sodium (PROLENSA) 0.07 % SOLN Place 1 drop into the left eye at bedtime.   Yes Historical Provider, MD  doxycycline (VIBRA-TABS) 100 MG tablet Take 1 tablet (100 mg total) by mouth 2 (two) times daily. Patient taking differently: Take 100 mg by mouth 2 (two) times daily. 10 day course started 09/07/16 09/07/16  Yes [name], MD  fluticasone (FLONASE) 50 MCG/ACT nasal spray Place 2 sprays into both nostrils at bedtime. 06/25/14  Yes [name], MD  Insulin Isophane & Regular Human (HUMULIN 70/30 KWIKPEN) (70-30) 100 UNIT/ML PEN Inject 16 units subcutaneously before breakfast and 20 units before supper Patient taking differently: Inject 20-25 Units into the skin See admin instructions. Inject 20 units subcutaneously before breakfast and 25 units before supper 06/20/16  Yes Eugenie Filler, MD  loratadine (CLARITIN) 10 MG tablet Take 10 mg by mouth at bedtime.    Yes Historical Provider, MD  metFORMIN (GLUCOPHAGE) 500 MG tablet TAKE ONE TABLET BY MOUTH TWICE DAILY WITH MEALS 01/21/16  Yes [name], MD  nitroGLYCERIN (NITROSTAT) 0.4 MG SL tablet Place 1 tablet (0.4 mg total) under the tongue every 5 (five) minutes as needed. For chest pain Patient taking differently: Place 0.4 mg under the tongue every 5 (five) minutes as needed for chest pain.  12/31/12  Yes Lisabeth Pick, MD  pantoprazole (PROTONIX) 40 MG tablet TAKE ONE TABLET BY MOUTH ONCE DAILY Patient taking differently: TAKE ONE TABLET BY MOUTH ONCE DAILY AT BEDTIME 10/15/15  Yes [name], MD  prednisoLONE acetate (PRED FORTE) 1 % ophthalmic suspension Place 1 drop into the left eye at bedtime.  01/18/16  Yes Historical Provider, MD  predniSONE (DELTASONE) 20 MG tablet 2 tabs po qd x 5d Patient taking differently: Take 40 mg by mouth daily with breakfast. 5 day course started 09/07/16 09/07/16  Yes [name], MD  FLOVENT HFA 220 MCG/ACT inhaler INHALE TWO PUFFS BY MOUTH  TWICE DAILY Patient not taking: Reported on 09/11/2016 01/12/16   [name], MD     Family History  Problem Relation Age of Onset  . Heart failure Mother   . Kidney disease Mother     renal failure  . Diabetes Mother   . Diabetes Father   . Diabetes Brother   . Heart disease Brother   . Heart attack Brother   . Heart attack Brother   . Heart attack Brother   . Colon cancer Neg Hx     Social History   Social History  . Marital status: Married    Spouse name: N/A  . Number of children: N/A  . Years of education: N/A   Occupational History  . Retired Systems analyst Unemployed   Social History Main Topics  . Smoking status: Former Smoker    Packs/day: 1.00    Years: 45.00    Types: Cigarettes    Quit date: 10/24/2008  . Smokeless tobacco: Never Used  . Alcohol use No  . Drug use: No  . Sexual activity: Not  Asked   Other Topics Concern  . None   Social History Narrative   Lives in [place], has husband and daughter and son. Daughter's family is living with her and husband at present. She is ambulatory daily without cane or walker.      Review of Systems  Constitutional: Positive for activity change, appetite change and fever.  HENT:       Edentulous  Respiratory: Negative for cough and shortness of breath.   Cardiovascular: Negative for chest pain.  Gastrointestinal: Positive for abdominal pain.  Psychiatric/Behavioral: Negative for behavioral problems and confusion.    Vital Signs: BP (!) 127/58 (BP Location: Left Arm)   Pulse 93   Temp 98 F (36.7 C) (Oral)   Resp 18   Ht '5\' 2"'$  (1.575 m)   Wt 170 lb (77.1 kg)   SpO2 96%   BMI 31.09 kg/m   Physical Exam  Constitutional: She is oriented to person, place, and time. She appears well-developed.  HENT:  Head: Normocephalic and atraumatic.  Cardiovascular: Normal rate, regular rhythm and normal heart sounds.   Pulmonary/Chest: Effort normal and breath sounds normal.  Abdominal: There is  tenderness. There is guarding.  Neurological: She is alert and oriented to person, place, and time.  Psychiatric: She has a normal mood and affect. Her behavior is normal. Judgment and thought content normal.  Nursing note and vitals reviewed.   Mallampati Score:  MD Evaluation Airway: WNL Heart: WNL Abdomen: WNL Chest/ Lungs: WNL ASA  Classification: 3 Mallampati/Airway Score: Two  Imaging: Dg Chest 2 View  Result Date: 09/08/2016 CLINICAL DATA:  Cough and chest congestion for 2-3 days. History of right-sided lung malignancy, COPD, discontinued smoking in 2010. EXAM: CHEST  2 VIEW COMPARISON:  AP chest x-ray and chest CT scan of June 14, 2016 FINDINGS: There is chronic volume loss on the right. A persistent abnormal soft tissue density mass is noted in the right upper lobe. There is pleural thickening in the right apex. No blunting of the costophrenic angles on the right or left is observed. The interstitial markings are mildly prominent in the left lung but stable. There is an area patchy density lateral to the cardiac apex which appears to lie in the lingula. The heart and pulmonary vascularity are normal. There is calcification in the wall of the aortic arch. The bony thorax exhibits no acute abnormality. IMPRESSION: 1. Mild lingular atelectasis or early pneumonia. Stable parenchymal density in the right upper lobe and right paratracheal region. No CHF. Followup PA and lateral chest X-ray is recommended in 3-4 weeks following trial of antibiotic therapy to ensure resolution and exclude underlying malignancy. 2. Thoracic aortic atherosclerosis. Electronically Signed   By: David  Martinique M.D.   On: 09/08/2016 08:15   Ct Abdomen Pelvis W Contrast  Result Date: 09/11/2016 CLINICAL DATA:  70 year old female with abdominal, pelvic and flank pain for 1 day. History of lung cancer, appendectomy, cholecystectomy and hysterectomy. EXAM: CT ABDOMEN AND PELVIS WITH CONTRAST TECHNIQUE: Multidetector  CT imaging of the abdomen and pelvis was performed using the standard protocol following bolus administration of intravenous contrast. CONTRAST:  16m ISOVUE-300 IOPAMIDOL (ISOVUE-300) INJECTION 61% COMPARISON:  06/14/2016 and prior exams. FINDINGS: Lower chest: No acute abnormality. Hepatobiliary: No acute or suspicious hepatic abnormality. The patient is status post cholecystectomy. Pancreas: Unremarkable Spleen: Unremarkable Adrenals/Urinary Tract: The kidneys, adrenal glands and bladder are unremarkable. Stomach/Bowel: Focal diverticulitis of the mid sigmoid colon is noted. A 2.5 x 4.1 cm abscess along the medial aspect  of the mid sigmoid colon is noted (image 73). Adjacent foci of free air and along the left pelvic side wall are noted. A small amount of free complex fluid within the pelvis is present. There is no evidence of bowel obstruction. No other focal areas of bowel wall thickening are present. The appendix is normal. Vascular/Lymphatic: Abdominal aortic atherosclerotic calcifications noted without aneurysm. No enlarged lymph nodes identified. Reproductive: Status post hysterectomy. No adnexal masses. Other: None Musculoskeletal: No acute or suspicious abnormalities. IMPRESSION: Focal diverticulitis of the mid sigmoid colon with adjacent 2.5 x 4.1 cm abscess and foci of pneumoperitoneum in this area. Small amount of complex free fluid within the pelvis. Abdominal aortic atherosclerosis. Electronically Signed   By: Margarette Canada M.D.   On: 09/11/2016 13:38    Labs:  CBC:  Recent Labs  06/19/16 0542 06/28/16 0931 09/11/16 1030 09/12/16 0422  WBC 5.5 10.3 15.4* 11.0*  HGB 11.2* 13.5 15.2* 11.9*  HCT 35.8* 40.0 45.3 36.2  PLT 206 305.0 261 186    COAGS:  Recent Labs  06/14/16 2032 09/12/16 0422  INR 1.20 1.34  APTT 34  --     BMP:  Recent Labs  06/19/16 0542 06/20/16 0546 06/28/16 0931 09/11/16 1030 09/12/16 0422  NA 142 140 137 140 139  K 3.5 3.7 3.6 3.8 3.2*  CL 108  104 103 105 111  CO2 '25 24 25 26 23  '$ GLUCOSE 131* 100* 214* 204* 112*  BUN <5* <5* '8 16 10  '$ CALCIUM 8.6* 9.1 9.0 9.5 7.9*  CREATININE 0.65 0.57 0.71 0.92 0.70  GFRNONAA >60 >60  --  >60 >60  GFRAA >60 >60  --  >60 >60    LIVER FUNCTION TESTS:  Recent Labs  12/29/15 2327 06/14/16 1158 09/11/16 1030 09/12/16 0422  BILITOT 0.3 0.9 0.5 0.7  AST '24 22 17 '$ 14*  ALT '31 19 16 '$ 13*  ALKPHOS 92 105 86 67  PROT 6.8 7.8 6.9 5.2*  ALBUMIN 3.6 4.0 3.5 2.6*    TUMOR MARKERS: No results for input(s): AFPTM, CEA, CA199, CHROMGRNA in the last 8760 hours.  Assessment and Plan:  Left lower abdomen drain placement Patient with diverticulitis; now with left lower quadrant abdominal abscess.   CT reviewed by Dr. Kathlene Cote and agreeable to attempt of drain placement which is scheduled for today.  Risks and Benefits discussed with the patient including bleeding, infection, damage to adjacent structures, bowel perforation/fistula connection, and sepsis. All of the patient's questions were answered, patient is agreeable to proceed. Consent signed and in chart.   Thank you for this interesting consult.  I greatly enjoyed meeting KIMBLY EANES and look forward to participating in their care.  A copy of this report was sent to the requesting provider on this date.  Electronically Signed: Docia Barrier 09/12/2016, 9:24 AM   I spent a total of 40 Minutes  in face to face in clinical consultation, greater than 50% of which was counseling/coordinating care for left lower abdomen drain placement.

## 2016-09-12 NOTE — Care Management Note (Signed)
Case Management Note  Patient Details  Name: [name] MRN: [id number] Date of Birth: 1945/10/29  Subjective/Objective:  Pt with a history of diverticulitis in August 2017 presents with worsening severe left lower quadrant pain. Resides with husband. Pt states independent with ADL's , uses a cane with ambulation PTA.    Lenah Messenger (Spouse)     [phone]       Action/Plan: PT evaluation pending...Marland KitchenMarland KitchenMarland Kitchen CM to f/u with d/c needs.  [facility] referral placed for community support @ d/c.  Expected Discharge Date:                  Expected Discharge Plan:  Home/Self Care  In-House Referral:     Discharge planning Services  CM Consult  Post Acute Care Choice:    Choice offered to:     DME Arranged:    DME Agency:     HH Arranged:    HH Agency:     Status of Service:  In process, will continue to follow  If discussed at Long Length of Stay Meetings, dates discussed:    Additional Comments:  Sharin Mons, RN 09/12/2016, 9:09 AM

## 2016-09-12 NOTE — Progress Notes (Addendum)
[facility] PROGRESS NOTE    Progress Note  [name]  YBO:[id number] DOB: 12-Aug-1946 DOA: 09/11/2016 PCP: [name], MD     Brief Narrative:   [name] is an 70 y.o. female past medical history of diverticulitis back in August 2017 who presents today with left lower quadrant pain that started the day prior to admission CT scan of the abdomen pelvis showed a sigmoid: Abscess 4 x 2 cm with a small fossae of pneumoperitoneum in the area.  Assessment/Plan:   Diverticulitis of sigmoid colon With abscess: Cultures are pending vitals are stable Leucytosis improving. Blood cultures are pending. Appreciate surgery's assistance. IR was consulted for drain placement. Keep the patient nothing by mouth. She relates her pain is not controlled with oral oxycodone.  Dysuria of suprapubic pain: She has remained afebrile Zosyn should cover. Urine cultures pending. No description of purulent urine.  COPD with recent exacerbation in the setting of recent pneumonia: Oxygen saturation remained stable Continue nebs and oxygen when necessary.  Continue to taper down steroids.  DM (diabetes mellitus), type 2, uncontrolled with complications ([place]): I7P was 7.1 Start Lantus plus sliding scale insulin.   DVT prophylaxis: lovenox Family Communication:none Disposition Plan/Barrier to D/C: unable to determine Code Status:     Code Status Orders        Start     Ordered   09/11/16 1448  Full code  Continuous     09/11/16 1453    Code Status History    Date Active Date Inactive Code Status Order ID Comments User Context   06/14/2016  7:52 PM 06/20/2016 10:01 PM Full Code [id number]  Orson Eva, MD Inpatient   05/27/2012  2:53 AM 05/31/2012 12:43 PM Full Code [id number]  Theressa Millard, MD ED   01/21/2012  9:47 PM 01/22/2012  3:27 PM Full Code [id number]  Gracy Bruins, RN Inpatient        IV Access:    Peripheral IV   Procedures and diagnostic studies:   Ct  Abdomen Pelvis W Contrast  Result Date: 09/11/2016 CLINICAL DATA:  70 year old female with abdominal, pelvic and flank pain for 1 day. History of lung cancer, appendectomy, cholecystectomy and hysterectomy. EXAM: CT ABDOMEN AND PELVIS WITH CONTRAST TECHNIQUE: Multidetector CT imaging of the abdomen and pelvis was performed using the standard protocol following bolus administration of intravenous contrast. CONTRAST:  181m ISOVUE-300 IOPAMIDOL (ISOVUE-300) INJECTION 61% COMPARISON:  06/14/2016 and prior exams. FINDINGS: Lower chest: No acute abnormality. Hepatobiliary: No acute or suspicious hepatic abnormality. The patient is status post cholecystectomy. Pancreas: Unremarkable Spleen: Unremarkable Adrenals/Urinary Tract: The kidneys, adrenal glands and bladder are unremarkable. Stomach/Bowel: Focal diverticulitis of the mid sigmoid colon is noted. A 2.5 x 4.1 cm abscess along the medial aspect of the mid sigmoid colon is noted (image 73). Adjacent foci of free air and along the left pelvic side wall are noted. A small amount of free complex fluid within the pelvis is present. There is no evidence of bowel obstruction. No other focal areas of bowel wall thickening are present. The appendix is normal. Vascular/Lymphatic: Abdominal aortic atherosclerotic calcifications noted without aneurysm. No enlarged lymph nodes identified. Reproductive: Status post hysterectomy. No adnexal masses. Other: None Musculoskeletal: No acute or suspicious abnormalities. IMPRESSION: Focal diverticulitis of the mid sigmoid colon with adjacent 2.5 x 4.1 cm abscess and foci of pneumoperitoneum in this area. Small amount of complex free fluid within the pelvis. Abdominal aortic atherosclerosis. Electronically Signed   By: JCleatis PolkaD.  On: 09/11/2016 13:38     Medical Consultants:    None.  Anti-Infectives:   Zosyn 11.19.2017  Subjective:    [name] she is nauseated, pain not controlled with current  medications. She relates is still hurts when she moves.  Objective:    Vitals:   09/11/16 1459 09/11/16 1531 09/11/16 2149 09/12/16 0505  BP:  (!) 108/44 (!) 120/56 (!) 127/58  Pulse:  80 89 93  Resp:  '16 18 18  '$ Temp: 99 F (37.2 C) 98.2 F (36.8 C) 100.2 F (37.9 C) 98 F (36.7 C)  TempSrc: Oral Oral Oral Oral  SpO2:  97% 95% 96%  Weight:      Height:        Intake/Output Summary (Last 24 hours) at 09/12/16 0958 Last data filed at 09/12/16 1610  Gross per 24 hour  Intake          3806.67 ml  Output                0 ml  Net          3806.67 ml   Filed Weights   09/11/16 1129  Weight: 77.1 kg (170 lb)    Exam: General exam: In no acute distress. Respiratory system: Good air movement and clear to auscultation. Cardiovascular system: S1 & S2 heard, RRR. No JVD, murmurs, rubs, gallops or clicks.  Gastrointestinal system: Abdomen is Soft, nondistended but is mostly tender to palpation the lower quadrant, with diffuse tenderness.` Extremities: No pedal edema. Skin: No rashes, lesions or ulcers Psychiatry: Judgement and insight appear normal. Mood & affect appropriate.    Data Reviewed:    Labs: Basic Metabolic Panel:  Recent Labs Lab 09/11/16 1030 09/12/16 0422  NA 140 139  K 3.8 3.2*  CL 105 111  CO2 26 23  GLUCOSE 204* 112*  BUN 16 10  CREATININE 0.92 0.70  CALCIUM 9.5 7.9*   GFR Estimated Creatinine Clearance: 64.7 mL/min (by C-G formula based on SCr of 0.7 mg/dL). Liver Function Tests:  Recent Labs Lab 09/11/16 1030 09/12/16 0422  AST 17 14*  ALT 16 13*  ALKPHOS 86 67  BILITOT 0.5 0.7  PROT 6.9 5.2*  ALBUMIN 3.5 2.6*    Recent Labs Lab 09/11/16 1030  LIPASE 29   No results for input(s): AMMONIA in the last 168 hours. Coagulation profile  Recent Labs Lab 09/12/16 0422  INR 1.34    CBC:  Recent Labs Lab 09/11/16 1030 09/12/16 0422  WBC 15.4* 11.0*  NEUTROABS 12.4*  --   HGB 15.2* 11.9*  HCT 45.3 36.2  MCV 90.2 90.5    PLT 261 186   Cardiac Enzymes: No results for input(s): CKTOTAL, CKMB, CKMBINDEX, TROPONINI in the last 168 hours. BNP (last 3 results) No results for input(s): PROBNP in the last 8760 hours. CBG:  Recent Labs Lab 09/12/16 0019 09/12/16 0544  GLUCAP 129* 125*   D-Dimer: No results for input(s): DDIMER in the last 72 hours. Hgb A1c:  Recent Labs  09/11/16 1447  HGBA1C 7.1*   Lipid Profile: No results for input(s): CHOL, HDL, LDLCALC, TRIG, CHOLHDL, LDLDIRECT in the last 72 hours. Thyroid function studies: No results for input(s): TSH, T4TOTAL, T3FREE, THYROIDAB in the last 72 hours.  Invalid input(s): FREET3 Anemia work up: No results for input(s): VITAMINB12, FOLATE, FERRITIN, TIBC, IRON, RETICCTPCT in the last 72 hours. Sepsis Labs:  Recent Labs Lab 09/11/16 1030 09/11/16 1045 09/12/16 0422  WBC 15.4*  --  11.0*  LATICACIDVEN  --  1.71  --    Microbiology No results found for this or any previous visit (from the past 240 hour(s)).   Medications:   . aspirin EC  81 mg Oral QHS  . atorvastatin  80 mg Oral Q breakfast  . insulin aspart  0-9 Units Subcutaneous Q6H  . pantoprazole  40 mg Oral Q breakfast  . piperacillin-tazobactam (ZOSYN)  IV  3.375 g Intravenous Q8H  . predniSONE  40 mg Oral Q breakfast  . sodium chloride flush  3 mL Intravenous Q12H   Continuous Infusions: . sodium chloride 1,000 mL (09/12/16 0426)    Time spent: 25 min   LOS: 0 days   Charlynne Cousins  [facility] Pager [phone]  *Please refer to [email or web address], password [email or web address] to get updated schedule on who will round on this patient, as hospitalists switch teams weekly. If 7PM-7AM, please contact night-coverage at [email or web address], password [email or web address] for any overnight needs.  09/12/2016, 9:58 AM

## 2016-09-12 NOTE — Plan of Care (Signed)
Problem: Activity: Goal: Risk for activity intolerance will decrease Outcome: Progressing Patient has tolerated being up with one assist to the bathroom without difficulty as opposed to using the bathroom in her bed. Patient verbalizes understanding of calling for assistance for the bathroom.

## 2016-09-12 NOTE — Plan of Care (Signed)
Problem: Skin Integrity: Goal: Risk for impaired skin integrity will decrease Outcome: Progressing Patient has been up to the bathroom multiple times today as opposed to wetting her bed. We educated patient that this will reduce the risk of skin breakdown. Patient verbalized understanding and has been using her call bell.

## 2016-09-12 NOTE — Consult Note (Signed)
   [facility] Inpatient Consult   09/12/2016  [name] 1946/04/13 [id number]   Patient was assessed for [facility] for community services. Patient was previously active with [facility].  Chart review [name] is an 70 y.o. female past medical history of diverticulitis back in August 2017 who presents today with left lower quadrant pain that started the day prior to admission CT scan of the abdomen pelvis showed a sigmoid: Abscess 4 x 2 cm with a small fossae of pneumoperitoneum in the area.  Met with patient at bedside regarding being restarted with [facility] services. Consent form sign and on file and a brochure and magnet with 24 hour nurse line with [facility] information given.  Of note, [facility] Care Management services does not replace or interfere with any services that are arranged by inpatient case management or social work. For additional questions or referrals please contact:  Natividad Brood, RN BSN [facility] Liaison  [phone] business mobile phone Toll free office [phone]

## 2016-09-12 NOTE — Progress Notes (Signed)
PT Cancellation Note  Patient Details Name: [name] MRN: [id number] DOB: [birth year]   Cancelled Treatment:    Reason Eval/Treat Not Completed: Fatigue/lethargy limiting ability to participate (observed patient earlier up to BR after procedure.with RN. Presently is asleep. Will return tomorrow. for evaluation)   Claretha Cooper 09/12/2016, 2:24 PM Tresa Endo PT [phone]

## 2016-09-12 NOTE — Progress Notes (Signed)
Patient ID: [name], female   DOB: 1946/08/28, 70 y.o.   MRN: [id number]  [facility] Surgery Progress Note     Subjective: Continued severe abdominal pain. Denies n/v.   Objective: Vital signs in last 24 hours: Temp:  [97.6 F (36.4 C)-100.2 F (37.9 C)] 98 F (36.7 C) (11/20 0505) Pulse Rate:  [78-96] 93 (11/20 0505) Resp:  [12-27] 18 (11/20 0505) BP: (90-127)/(37-59) 127/58 (11/20 0505) SpO2:  [93 %-99 %] 96 % (11/20 0505) Weight:  [170 lb (77.1 kg)] 170 lb (77.1 kg) (11/19 1129) Last BM Date: 09/10/16  Intake/Output from previous day: 11/19 0701 - 11/20 0700 In: 3806.7 [I.V.:1416.7; IV Piggyback:2390] Out: -  Intake/Output this shift: No intake/output data recorded.  PE: Gen:  Alert, NAD, cooperative Pulm: effort normla Abd: Soft, ND, +BS, no HSM, severe tenderness with guarding LLQ, rest of abdomen is also mildly tender  Lab Results:   Recent Labs  09/11/16 1030 09/12/16 0422  WBC 15.4* 11.0*  HGB 15.2* 11.9*  HCT 45.3 36.2  PLT 261 186   BMET  Recent Labs  09/11/16 1030 09/12/16 0422  NA 140 139  K 3.8 3.2*  CL 105 111  CO2 26 23  GLUCOSE 204* 112*  BUN 16 10  CREATININE 0.92 0.70  CALCIUM 9.5 7.9*   PT/INR  Recent Labs  09/12/16 0422  LABPROT 16.7*  INR 1.34   CMP     Component Value Date/Time   NA 139 09/12/2016 0422   NA 141 01/15/2015 0848   K 3.2 (L) 09/12/2016 0422   K 4.0 01/15/2015 0848   CL 111 09/12/2016 0422   CL 105 02/18/2013 0812   CO2 23 09/12/2016 0422   CO2 25 01/15/2015 0848   GLUCOSE 112 (H) 09/12/2016 0422   GLUCOSE 99 01/15/2015 0848   GLUCOSE 143 (H) 02/18/2013 0812   BUN 10 09/12/2016 0422   BUN 9.0 01/15/2015 0848   CREATININE 0.70 09/12/2016 0422   CREATININE 0.7 01/15/2015 0848   CALCIUM 7.9 (L) 09/12/2016 0422   CALCIUM 9.2 01/15/2015 0848   PROT 5.2 (L) 09/12/2016 0422   PROT 7.1 01/15/2015 0848   ALBUMIN 2.6 (L) 09/12/2016 0422   ALBUMIN 3.7 01/15/2015 0848   AST 14 (L) 09/12/2016  0422   AST 19 01/15/2015 0848   ALT 13 (L) 09/12/2016 0422   ALT 20 01/15/2015 0848   ALKPHOS 67 09/12/2016 0422   ALKPHOS 92 01/15/2015 0848   BILITOT 0.7 09/12/2016 0422   BILITOT 0.34 01/15/2015 0848   GFRNONAA >60 09/12/2016 0422   GFRAA >60 09/12/2016 0422   Lipase     Component Value Date/Time   LIPASE 29 09/11/2016 1030       Studies/Results: Ct Abdomen Pelvis W Contrast  Result Date: 09/11/2016 CLINICAL DATA:  70 year old female with abdominal, pelvic and flank pain for 1 day. History of lung cancer, appendectomy, cholecystectomy and hysterectomy. EXAM: CT ABDOMEN AND PELVIS WITH CONTRAST TECHNIQUE: Multidetector CT imaging of the abdomen and pelvis was performed using the standard protocol following bolus administration of intravenous contrast. CONTRAST:  157m ISOVUE-300 IOPAMIDOL (ISOVUE-300) INJECTION 61% COMPARISON:  06/14/2016 and prior exams. FINDINGS: Lower chest: No acute abnormality. Hepatobiliary: No acute or suspicious hepatic abnormality. The patient is status post cholecystectomy. Pancreas: Unremarkable Spleen: Unremarkable Adrenals/Urinary Tract: The kidneys, adrenal glands and bladder are unremarkable. Stomach/Bowel: Focal diverticulitis of the mid sigmoid colon is noted. A 2.5 x 4.1 cm abscess along the medial aspect of the mid sigmoid colon is noted (  image 73). Adjacent foci of free air and along the left pelvic side wall are noted. A small amount of free complex fluid within the pelvis is present. There is no evidence of bowel obstruction. No other focal areas of bowel wall thickening are present. The appendix is normal. Vascular/Lymphatic: Abdominal aortic atherosclerotic calcifications noted without aneurysm. No enlarged lymph nodes identified. Reproductive: Status post hysterectomy. No adnexal masses. Other: None Musculoskeletal: No acute or suspicious abnormalities. IMPRESSION: Focal diverticulitis of the mid sigmoid colon with adjacent 2.5 x 4.1 cm abscess and  foci of pneumoperitoneum in this area. Small amount of complex free fluid within the pelvis. Abdominal aortic atherosclerosis. Electronically Signed   By: Margarette Canada M.D.   On: 09/11/2016 13:38    Anti-infectives: Anti-infectives    Start     Dose/Rate Route Frequency Ordered Stop   09/11/16 2200  piperacillin-tazobactam (ZOSYN) IVPB 3.375 g     3.375 g 12.5 mL/hr over 240 Minutes Intravenous Every 8 hours 09/11/16 1840     09/11/16 1400  piperacillin-tazobactam (ZOSYN) IVPB 3.375 g     3.375 g 100 mL/hr over 30 Minutes Intravenous  Once 09/11/16 1346 09/11/16 1502       Assessment/Plan Diverticulitis with abscess - CT: Focal diverticulitis of the mid sigmoid colon with adjacent 2.5 x 4.1 cm abscess and foci of pneumoperitoneum in this area. Small amount of complex free fluid within the pelvis. - WBC trending down, 11.0 today from 15.4  COPD with recent exacerbation in the setting of recent PNA , history of limited stage SCLCa , currently stable   DM HLD GERD  ID - [email or web address]>> VTE - lovenox FEN - NPO  Plan - continue NPO and IV antibiotics. Planning possible drainage via CT guidance by IR today.   LOS: 0 days    Jerrye Beavers , [facility] Surgery 09/12/2016, 9:28 AM Pager: [phone] Consults: [phone] Mon-Fri 7:00 am-4:30 pm Sat-Sun 7:00 am-11:30 am

## 2016-09-13 ENCOUNTER — Ambulatory Visit: Payer: Self-pay | Admitting: Family Medicine

## 2016-09-13 LAB — BASIC METABOLIC PANEL
ANION GAP: 5 (ref 5–15)
BUN: 10 mg/dL (ref 6–20)
CALCIUM: 8.2 mg/dL — AB (ref 8.9–10.3)
CHLORIDE: 114 mmol/L — AB (ref 101–111)
CO2: 20 mmol/L — AB (ref 22–32)
Creatinine, Ser: 0.66 mg/dL (ref 0.44–1.00)
GFR calc non Af Amer: >60 mL/min (ref >60)
Glucose, Bld: 164 mg/dL — ABNORMAL HIGH (ref 65–99)
Potassium: 3.9 mmol/L (ref 3.5–5.1)
Sodium: 139 mmol/L (ref 135–145)

## 2016-09-13 LAB — GLUCOSE, CAPILLARY
GLUCOSE-CAPILLARY: 176 mg/dL — AB (ref 65–99)
GLUCOSE-CAPILLARY: 280 mg/dL — AB (ref 65–99)
GLUCOSE-CAPILLARY: 208 mg/dL — AB (ref 65–99)
Glucose-Capillary: 182 mg/dL — ABNORMAL HIGH (ref 65–99)

## 2016-09-13 MED ORDER — MORPHINE SULFATE (PF) 4 MG/ML IV SOLN: 4.0000 mg | INTRAVENOUS | Status: DC | PRN

## 2016-09-13 NOTE — Evaluation (Signed)
Physical Therapy Evaluation Patient Details Name: [name] MRN: [id number] DOB: [birth year] Today's Date: 09/13/2016   History of Present Illness  [name] an 70 y.o.femalepast medical history of diverticulitis back in August 2017 who presents today with left lower quadrant pain that started the day prior to admission CT scan of the abdomen pelvis showed a sigmoid: Abscess 4 x 2 cm with a small fossae of pneumoperitoneum in the area.S/p IR drain of abcess 11/20.  Clinical Impression  Pt extremely SOB with mobility requiring freq standing rest stops during ambulation. Pt requires minA for all mobility and ADLs at this time. Pt's spouse currently undergoing chemo and radiation for newly diagnosed cancer. Pt to benefit from ST-SNF upon d/c to address mentioned deficits and achieve safe mod I function for safe transition home.    Follow Up Recommendations SNF;Supervision/Assistance - 24 hour    Equipment Recommendations  None recommended by PT    Recommendations for Other Services       Precautions / Restrictions Precautions Precautions: Fall Restrictions Weight Bearing Restrictions: No      Mobility  Bed Mobility Overal bed mobility: Needs Assistance Bed Mobility: Rolling;Sidelying to Sit Rolling: Supervision Sidelying to sit: Min assist;Mod assist       General bed mobility comments: max directional v/c's, pt initiated but then required assist for trunk elevation  Transfers Overall transfer level: Needs assistance Equipment used: Rolling walker (2 wheeled) Transfers: Sit to/from Stand Sit to Stand: Min guard         General transfer comment: v/c's for hand placemet  Ambulation/Gait Ambulation/Gait assistance: Min assist Ambulation Distance (Feet): 10 Feet (to/from bathroom) Assistive device: Rolling walker (2 wheeled) Gait Pattern/deviations: Step-to pattern;Decreased stride length Gait velocity: slow Gait velocity interpretation: Below normal  speed for age/gender General Gait Details: + SOB, waddle type pattern, wide base of support  Stairs            Wheelchair Mobility    Modified Rankin (Stroke Patients Only)       Balance Overall balance assessment: Needs assistance Sitting-balance support: No upper extremity supported;Feet supported Sitting balance-Leahy Scale: Good     Standing balance support: Bilateral upper extremity supported Standing balance-Leahy Scale: Fair                               Pertinent Vitals/Pain Pain Assessment: 0-10 Pain Score: 8  Pain Location: abdomen Pain Descriptors / Indicators: Aching Pain Intervention(s): Monitored during session    Home Living Family/patient expects to be discharged to:: Private residence Living Arrangements: Spouse/significant other Available Help at Discharge: Family;Available 24 hours/day Type of Home: House Home Access: Stairs to enter Entrance Stairs-Rails: Right Entrance Stairs-Number of Steps: 4 Home Layout: One level Home Equipment: Walker - 2 wheels;Shower seat Additional Comments: pt's spouse just diagnosed with cancer and undergoing radiation    Prior Function Level of Independence: Independent         Comments: pt doesn't drive and has recently been using cane     Hand Dominance   Dominant Hand: Right    Extremity/Trunk Assessment   Upper Extremity Assessment: Overall WFL for tasks assessed           Lower Extremity Assessment: Generalized weakness      Cervical / Trunk Assessment:  (abdominal jp drain)  Communication   Communication: No difficulties  Cognition Arousal/Alertness: Awake/alert Behavior During Therapy: WFL for tasks assessed/performed Overall Cognitive Status: Within Functional Limits for  tasks assessed                      General Comments General comments (skin integrity, edema, etc.): jp drain site at abdomen dry wtih dressing intact    Exercises     Assessment/Plan     PT Assessment Patient needs continued PT services  PT Problem List Decreased strength;Decreased range of motion;Decreased activity tolerance;Decreased balance;Decreased mobility          PT Treatment Interventions DME instruction;Gait training;Stair training;Functional mobility training;Therapeutic activities;Therapeutic exercise    PT Goals (Current goals can be found in the Care Plan section)  Acute Rehab PT Goals Patient Stated Goal: to get stronger PT Goal Formulation: With patient Time For Goal Achievement: 09/20/16 Potential to Achieve Goals: Good    Frequency Min 3X/week   Barriers to discharge Decreased caregiver support spouse undergoing chemo and radiation    Co-evaluation               End of Session Equipment Utilized During Treatment: Gait belt Activity Tolerance: Patient limited by pain;Patient limited by fatigue (and + SOB) Patient left: in chair;with call bell/phone within reach;with chair alarm set;with nursing/sitter in room Nurse Communication: Mobility status         Time: 0350-0938 PT Time Calculation (min) (ACUTE ONLY): 25 min   Charges:   PT Evaluation $PT Eval Moderate Complexity: 1 Procedure PT Treatments $Gait Training: 8-22 mins   PT G Codes:        Ronnika Collett M Eyal Greenhaw 09/13/2016, 2:04 PM   Kittie Plater, PT, DPT Pager #: [phone] Office #: [phone]

## 2016-09-13 NOTE — Progress Notes (Signed)
Inpatient Diabetes Program Recommendations  AACE/ADA: New Consensus Statement on Inpatient Glycemic Control (2015)  Target Ranges:  Prepandial:   less than 140 mg/dL      Peak postprandial:   less than 180 mg/dL (1-2 hours)      Critically ill patients:  140 - 180 mg/dL   Lab Results  Component Value Date   GLUCAP 176 (H) 09/13/2016   HGBA1C 7.1 (H) 09/11/2016    Review of Glycemic Control  Results for [name], [name] (MRN [id number]) as of 09/13/2016 09:58  Ref. Range 09/12/2016 00:19 09/12/2016 05:44 09/12/2016 17:29 09/12/2016 21:16 09/13/2016 07:44  Glucose-Capillary Latest Ref Range: 65 - 99 mg/dL 129 (H) 125 (H) 318 (H) 283 (H) 176 (H)    Diabetes history: Type 2 Outpatient Diabetes medications: Humulin 70/30 20 units qam, 25 units pre-supper, Metformin '500mg'$  bid Current orders for Inpatient glycemic control: Lantus 5 units qday, Novolog 0-9 units tid    *'30mg'$  prednisone qam  Inpatient Diabetes Program Recommendations: Consider adding Novolog 0-5 untis qhs  Gentry Fitz, RN, [place], [place], CDE Diabetes Coordinator Inpatient Diabetes Program  [phone] (Team Pager) [phone] ([place]) 09/13/2016 10:10 AM

## 2016-09-13 NOTE — Progress Notes (Signed)
[facility] PROGRESS NOTE    Progress Note  [name]  NFA:[id number] DOB: [birth year] DOA: 09/11/2016 PCP: [name], MD     Brief Narrative:   [name] is an 70 y.o. female past medical history of diverticulitis back in August 2017 who presents today with left lower quadrant pain that started the day prior to admission CT scan of the abdomen pelvis showed a sigmoid: Abscess 4 x 2 cm with a small fossae of pneumoperitoneum in the area.  Assessment/Plan:   Diverticulitis of sigmoid colon With abscess: Cultures are pending vitals are stable Leucytosis improving. Blood cultures are negative till date. Appreciate surgery's assistance. IR place drain on 11.20.2017t. Clear liq diet Cont morphine and ketoralac.  Dysuria of suprapubic pain: She has remained afebrile Zosyn should cover. Urine cultures pending. No description of purulent urine.  COPD with recent exacerbation in the setting of recent pneumonia: Oxygen saturation remained stable Continue nebs and oxygen when necessary.  Continue to taper down steroids.  DM (diabetes mellitus), type 2, uncontrolled with complications ([place]): I6N was 7.1 Start Lantus plus sliding scale insulin.   DVT prophylaxis: lovenox Family Communication:none Disposition Plan/Barrier to D/C: home in 2-4 days Code Status:     Code Status Orders        Start     Ordered   09/11/16 1448  Full code  Continuous     09/11/16 1453    Code Status History    Date Active Date Inactive Code Status Order ID Comments User Context   06/14/2016  7:52 PM 06/20/2016 10:01 PM Full Code [id number]  Orson Eva, MD Inpatient   05/27/2012  2:53 AM 05/31/2012 12:43 PM Full Code [id number]  Theressa Millard, MD ED   01/21/2012  9:47 PM 01/22/2012  3:27 PM Full Code [id number]  Gracy Bruins, RN Inpatient        IV Access:    Peripheral IV   Procedures and diagnostic studies:   X-ray Chest Pa And Lateral  Result Date:  09/12/2016 CLINICAL DATA:  Preop EXAM: CHEST  2 VIEW COMPARISON:  09/07/2016 FINDINGS: Elevated right hemidiaphragm. Right medial upper lobe density again noted, unchanged. Left lung is clear. Heart is normal size. No acute bony abnormality. IMPRESSION: Stable right medial upper lobe density and elevated right hemidiaphragm. No acute findings. Electronically Signed   By: Rolm Baptise M.D.   On: 09/12/2016 13:25   Ct Abdomen Pelvis W Contrast  Result Date: 09/11/2016 CLINICAL DATA:  70 year old female with abdominal, pelvic and flank pain for 1 day. History of lung cancer, appendectomy, cholecystectomy and hysterectomy. EXAM: CT ABDOMEN AND PELVIS WITH CONTRAST TECHNIQUE: Multidetector CT imaging of the abdomen and pelvis was performed using the standard protocol following bolus administration of intravenous contrast. CONTRAST:  169m ISOVUE-300 IOPAMIDOL (ISOVUE-300) INJECTION 61% COMPARISON:  06/14/2016 and prior exams. FINDINGS: Lower chest: No acute abnormality. Hepatobiliary: No acute or suspicious hepatic abnormality. The patient is status post cholecystectomy. Pancreas: Unremarkable Spleen: Unremarkable Adrenals/Urinary Tract: The kidneys, adrenal glands and bladder are unremarkable. Stomach/Bowel: Focal diverticulitis of the mid sigmoid colon is noted. A 2.5 x 4.1 cm abscess along the medial aspect of the mid sigmoid colon is noted (image 73). Adjacent foci of free air and along the left pelvic side wall are noted. A small amount of free complex fluid within the pelvis is present. There is no evidence of bowel obstruction. No other focal areas of bowel wall thickening are present. The appendix is normal. Vascular/Lymphatic: Abdominal aortic  atherosclerotic calcifications noted without aneurysm. No enlarged lymph nodes identified. Reproductive: Status post hysterectomy. No adnexal masses. Other: None Musculoskeletal: No acute or suspicious abnormalities. IMPRESSION: Focal diverticulitis of the mid  sigmoid colon with adjacent 2.5 x 4.1 cm abscess and foci of pneumoperitoneum in this area. Small amount of complex free fluid within the pelvis. Abdominal aortic atherosclerosis. Electronically Signed   By: Margarette Canada M.D.   On: 09/11/2016 13:38   Ct Image Guided Fluid Drain By Catheter  Result Date: 09/12/2016 CLINICAL DATA:  Sigmoid diverticulitis with focal diverticular abscess. EXAM: CT GUIDED CATHETER DRAINAGE OF PERITONEAL ABSCESS ANESTHESIA/SEDATION: 1.5 mg IV Versed 50 mcg IV Fentanyl Total Moderate Sedation Time:  30 minutes The patient's level of consciousness and physiologic status were continuously monitored during the procedure by Radiology nursing. PROCEDURE: The procedure, risks, benefits, and alternatives were explained to the patient. Questions regarding the procedure were encouraged and answered. The patient understands and consents to the procedure. A time out was performed prior to initiating the procedure. The anterior abdominal wall was prepped with chlorhexidine in a sterile fashion, and a sterile drape was applied covering the operative field. A sterile gown and sterile gloves were used for the procedure. Local anesthesia was provided with 1% Lidocaine. CT was performed in a supine position through the pelvis. Under CT guidance, an 18 gauge trocar needle was advanced to the level of a sigmoid diverticular abscess. After advancing a needle into the abscess, a guidewire was advanced. The tract was dilated and a 10 French percutaneous drainage catheter advanced over the wire. Catheter position was confirmed by CT. A fluid sample was aspirated and sent for culture analysis. The catheter was connected to suction bulb drainage. It was secured at the skin with a Prolene retention suture and StatLock device. COMPLICATIONS: None FINDINGS: Anterior percutaneous window was present to the level of the diverticular abscess located medial to the sigmoid colon. Aspiration yielded bloody and purulent  fluid. The drainage catheter was able to be formed in the abscess. IMPRESSION: CT-guided percutaneous drainage of diverticular peritoneal abscess located adjacent to the sigmoid colon. A sample of purulent fluid was sent for culture analysis. Electronically Signed   By: Aletta Edouard M.D.   On: 09/12/2016 16:45     Medical Consultants:    None.  Anti-Infectives:   Zosyn 11.19.2017  Subjective:    [name]  , pain not controlled with current medications. She relates is still hurts when she moves.  Objective:    Vitals:   09/12/16 2224 09/13/16 0120 09/13/16 0143 09/13/16 0530  BP: (!) 103/48 (!) 149/64 140/68 (!) 118/48  Pulse: 61 88 97 (!) 101  Resp:    20  Temp:   97.5 F (36.4 C) 98.4 F (36.9 C)  TempSrc:   Oral Oral  SpO2:    95%  Weight:    83.6 kg (184 lb 3.2 oz)  Height:        Intake/Output Summary (Last 24 hours) at 09/13/16 1217 Last data filed at 09/13/16 9892  Gross per 24 hour  Intake          3078.33 ml  Output             1735 ml  Net          1343.33 ml   Filed Weights   09/11/16 1129 09/13/16 0530  Weight: 77.1 kg (170 lb) 83.6 kg (184 lb 3.2 oz)    Exam: General exam: In no acute distress. Respiratory  system: Good air movement and clear to auscultation. Cardiovascular system: S1 & S2 heard, RRR. No JVD, murmurs, rubs, gallops or clicks.  Gastrointestinal system: Abdomen is Soft, nondistended but is mostly tender to palpation the lower quadrant, with diffuse tenderness.` Extremities: No pedal edema. Skin: No rashes, lesions or ulcers Psychiatry: Judgement and insight appear normal. Mood & affect appropriate.    Data Reviewed:    Labs: Basic Metabolic Panel:  Recent Labs Lab 09/11/16 1030 09/12/16 0422 09/13/16 0513  NA 140 139 139  K 3.8 3.2* 3.9  CL 105 111 114*  CO2 26 23 20*  GLUCOSE 204* 112* 164*  BUN '16 10 10  '$ CREATININE 0.92 0.70 0.66  CALCIUM 9.5 7.9* 8.2*   GFR Estimated Creatinine Clearance: 67.5 mL/min  (by C-G formula based on SCr of 0.66 mg/dL). Liver Function Tests:  Recent Labs Lab 09/11/16 1030 09/12/16 0422  AST 17 14*  ALT 16 13*  ALKPHOS 86 67  BILITOT 0.5 0.7  PROT 6.9 5.2*  ALBUMIN 3.5 2.6*    Recent Labs Lab 09/11/16 1030  LIPASE 29   No results for input(s): AMMONIA in the last 168 hours. Coagulation profile  Recent Labs Lab 09/12/16 0422  INR 1.34    CBC:  Recent Labs Lab 09/11/16 1030 09/12/16 0422  WBC 15.4* 11.0*  NEUTROABS 12.4*  --   HGB 15.2* 11.9*  HCT 45.3 36.2  MCV 90.2 90.5  PLT 261 186   Cardiac Enzymes: No results for input(s): CKTOTAL, CKMB, CKMBINDEX, TROPONINI in the last 168 hours. BNP (last 3 results) No results for input(s): PROBNP in the last 8760 hours. CBG:  Recent Labs Lab 09/12/16 0019 09/12/16 0544 09/12/16 1729 09/12/16 2116 09/13/16 0744  GLUCAP 129* 125* 318* 283* 176*   D-Dimer: No results for input(s): DDIMER in the last 72 hours. Hgb A1c:  Recent Labs  09/11/16 1447  HGBA1C 7.1*   Lipid Profile: No results for input(s): CHOL, HDL, LDLCALC, TRIG, CHOLHDL, LDLDIRECT in the last 72 hours. Thyroid function studies: No results for input(s): TSH, T4TOTAL, T3FREE, THYROIDAB in the last 72 hours.  Invalid input(s): FREET3 Anemia work up: No results for input(s): VITAMINB12, FOLATE, FERRITIN, TIBC, IRON, RETICCTPCT in the last 72 hours. Sepsis Labs:  Recent Labs Lab 09/11/16 1030 09/11/16 1045 09/12/16 0422  WBC 15.4*  --  11.0*  LATICACIDVEN  --  1.71  --    Microbiology Recent Results (from the past 240 hour(s))  Culture, blood (Routine X 2) w Reflex to ID Panel     Status: None (Preliminary result)   Collection Time: 09/11/16 11:50 AM  Result Value Ref Range Status   Specimen Description BLOOD RIGHT ANTECUBITAL  Final   Special Requests BOTTLES DRAWN AEROBIC AND ANAEROBIC 5CC  Final   Culture NO GROWTH 2 DAYS  Final   Report Status PENDING  Incomplete  Culture, blood (Routine X 2) w  Reflex to ID Panel     Status: None (Preliminary result)   Collection Time: 09/11/16 11:55 AM  Result Value Ref Range Status   Specimen Description BLOOD RIGHT HAND  Final   Special Requests BOTTLES DRAWN AEROBIC AND ANAEROBIC 5CC  Final   Culture NO GROWTH 2 DAYS  Final   Report Status PENDING  Incomplete  Urine culture     Status: Abnormal   Collection Time: 09/11/16  3:36 PM  Result Value Ref Range Status   Specimen Description URINE, RANDOM  Final   Special Requests NONE  Final   Culture  MULTIPLE SPECIES PRESENT, SUGGEST RECOLLECTION (A)  Final   Report Status 09/12/2016 FINAL  Final  Aerobic/Anaerobic Culture (surgical/deep wound)     Status: None (Preliminary result)   Collection Time: 09/12/16 12:52 PM  Result Value Ref Range Status   Specimen Description ABSCESS  Final   Special Requests DIVERTICULAR ABSCESS  Final   Gram Stain   Final    MODERATE WBC PRESENT,BOTH PMN AND MONONUCLEAR RARE GRAM NEGATIVE RODS    Culture CULTURE REINCUBATED FOR BETTER GROWTH  Final   Report Status PENDING  Incomplete     Medications:   . aspirin EC  81 mg Oral QHS  . atorvastatin  80 mg Oral Q breakfast  . fluticasone  2 spray Each Nare QHS  . insulin aspart  0-9 Units Subcutaneous TID WC  . insulin glargine  5 Units Subcutaneous QHS  . ketorolac  1 drop Left Eye QID  . pantoprazole  40 mg Oral Q breakfast  . piperacillin-tazobactam (ZOSYN)  IV  3.375 g Intravenous Q8H  . predniSONE  30 mg Oral Q breakfast  . sodium chloride flush  3 mL Intravenous Q12H   Continuous Infusions: . sodium chloride 1,000 mL (09/13/16 0620)    Time spent: 25 min   LOS: 1 day   Charlynne Cousins  [facility] Pager [phone]  *Please refer to [email or web address], password [email or web address] to get updated schedule on who will round on this patient, as hospitalists switch teams weekly. If 7PM-7AM, please contact night-coverage at [email or web address], password [email or web address] for any overnight needs.  09/13/2016, 12:17 PM

## 2016-09-13 NOTE — Progress Notes (Signed)
Referring Physician(s): Dr Theodosia Quay  Supervising Physician: Marybelle Killings  Patient Status:  [facility] - In-pt  Chief Complaint:  Pelvic diverticular abscess Drain placed 11/20 in IR  Subjective:  Output 75 cc overnight Bloody/purulent fluid Still sore at site and low abd   Allergies: Lactose intolerance (gi) and Ibuprofen  Medications: Prior to Admission medications   Medication Sig Start Date End Date Taking? Authorizing Provider  acetaminophen (TYLENOL) 500 MG tablet Take 1,000 mg by mouth every 6 (six) hours as needed for headache (pain).   Yes Historical Provider, MD  albuterol (PROAIR HFA) 108 (90 Base) MCG/ACT inhaler Inhale 2 puffs into the lungs every 6 (six) hours as needed for wheezing or shortness of breath.   Yes Historical Provider, MD  albuterol (PROVENTIL) (2.5 MG/3ML) 0.083% nebulizer solution USE ONE VIAL IN NEBULIZER EVERY 4 HOURS AS NEEDED FOR WHEEZING OR SHORTNESS OF BREATH 12/16/15  Yes [name], MD  aspirin EC 81 MG tablet Take 81 mg by mouth at bedtime.   Yes Historical Provider, MD  aspirin-acetaminophen-caffeine (EXCEDRIN MIGRAINE) [phone] MG per tablet Take 1 tablet by mouth every 6 (six) hours as needed for headache.   Yes Historical Provider, MD  atorvastatin (LIPITOR) 80 MG tablet TAKE ONE TABLET BY MOUTH ONCE DAILY Patient taking differently: TAKE ONE TABLET BY MOUTH ONCE DAILY AT BEDTIME 07/18/16  Yes [name], MD  Bromfenac Sodium (PROLENSA) 0.07 % SOLN Place 1 drop into the left eye at bedtime.   Yes Historical Provider, MD  doxycycline (VIBRA-TABS) 100 MG tablet Take 1 tablet (100 mg total) by mouth 2 (two) times daily. Patient taking differently: Take 100 mg by mouth 2 (two) times daily. 10 day course started 09/07/16 09/07/16  Yes [name], MD  fluticasone (FLONASE) 50 MCG/ACT nasal spray Place 2 sprays into both nostrils at bedtime. 06/25/14  Yes [name], MD  Insulin Isophane & Regular Human (HUMULIN 70/30  KWIKPEN) (70-30) 100 UNIT/ML PEN Inject 16 units subcutaneously before breakfast and 20 units before supper Patient taking differently: Inject 20-25 Units into the skin See admin instructions. Inject 20 units subcutaneously before breakfast and 25 units before supper 06/20/16  Yes Eugenie Filler, MD  loratadine (CLARITIN) 10 MG tablet Take 10 mg by mouth at bedtime.    Yes Historical Provider, MD  metFORMIN (GLUCOPHAGE) 500 MG tablet TAKE ONE TABLET BY MOUTH TWICE DAILY WITH MEALS 01/21/16  Yes [name], MD  nitroGLYCERIN (NITROSTAT) 0.4 MG SL tablet Place 1 tablet (0.4 mg total) under the tongue every 5 (five) minutes as needed. For chest pain Patient taking differently: Place 0.4 mg under the tongue every 5 (five) minutes as needed for chest pain.  12/31/12  Yes Lisabeth Pick, MD  pantoprazole (PROTONIX) 40 MG tablet TAKE ONE TABLET BY MOUTH ONCE DAILY Patient taking differently: TAKE ONE TABLET BY MOUTH ONCE DAILY AT BEDTIME 10/15/15  Yes [name], MD  prednisoLONE acetate (PRED FORTE) 1 % ophthalmic suspension Place 1 drop into the left eye at bedtime.  01/18/16  Yes Historical Provider, MD  predniSONE (DELTASONE) 20 MG tablet 2 tabs po qd x 5d Patient taking differently: Take 40 mg by mouth daily with breakfast. 5 day course started 09/07/16 09/07/16  Yes [name], MD  FLOVENT HFA 220 MCG/ACT inhaler INHALE TWO PUFFS BY MOUTH TWICE DAILY Patient not taking: Reported on 09/11/2016 01/12/16   [name], MD     Vital Signs: BP Marland Kitchen)  118/48 (BP Location: Left Arm)   Pulse (!) 101   Temp 98.4 F (36.9 C) (Oral)   Resp 20   Ht '5\' 2"'$  (1.575 m)   Wt 184 lb 3.2 oz (83.6 kg)   SpO2 95%   BMI 33.69 kg/m   Physical Exam  Constitutional: She is oriented to person, place, and time.  Abdominal: Soft. Bowel sounds are normal. There is tenderness.  Neurological: She is alert and oriented to person, place, and time.  Skin: Skin is warm and dry.  Site is clean and  dry Tender No bleeding Output 75 cc recorded Bloody and purulent +WBCs on Cx so far  Psychiatric: She has a normal mood and affect. Her behavior is normal.  Nursing note and vitals reviewed.   Imaging: X-ray Chest Pa And Lateral  Result Date: 09/12/2016 CLINICAL DATA:  Preop EXAM: CHEST  2 VIEW COMPARISON:  09/07/2016 FINDINGS: Elevated right hemidiaphragm. Right medial upper lobe density again noted, unchanged. Left lung is clear. Heart is normal size. No acute bony abnormality. IMPRESSION: Stable right medial upper lobe density and elevated right hemidiaphragm. No acute findings. Electronically Signed   By: Rolm Baptise M.D.   On: 09/12/2016 13:25   Ct Abdomen Pelvis W Contrast  Result Date: 09/11/2016 CLINICAL DATA:  70 year old female with abdominal, pelvic and flank pain for 1 day. History of lung cancer, appendectomy, cholecystectomy and hysterectomy. EXAM: CT ABDOMEN AND PELVIS WITH CONTRAST TECHNIQUE: Multidetector CT imaging of the abdomen and pelvis was performed using the standard protocol following bolus administration of intravenous contrast. CONTRAST:  134m ISOVUE-300 IOPAMIDOL (ISOVUE-300) INJECTION 61% COMPARISON:  06/14/2016 and prior exams. FINDINGS: Lower chest: No acute abnormality. Hepatobiliary: No acute or suspicious hepatic abnormality. The patient is status post cholecystectomy. Pancreas: Unremarkable Spleen: Unremarkable Adrenals/Urinary Tract: The kidneys, adrenal glands and bladder are unremarkable. Stomach/Bowel: Focal diverticulitis of the mid sigmoid colon is noted. A 2.5 x 4.1 cm abscess along the medial aspect of the mid sigmoid colon is noted (image 73). Adjacent foci of free air and along the left pelvic side wall are noted. A small amount of free complex fluid within the pelvis is present. There is no evidence of bowel obstruction. No other focal areas of bowel wall thickening are present. The appendix is normal. Vascular/Lymphatic: Abdominal aortic  atherosclerotic calcifications noted without aneurysm. No enlarged lymph nodes identified. Reproductive: Status post hysterectomy. No adnexal masses. Other: None Musculoskeletal: No acute or suspicious abnormalities. IMPRESSION: Focal diverticulitis of the mid sigmoid colon with adjacent 2.5 x 4.1 cm abscess and foci of pneumoperitoneum in this area. Small amount of complex free fluid within the pelvis. Abdominal aortic atherosclerosis. Electronically Signed   By: JMargarette CanadaM.D.   On: 09/11/2016 13:38   Ct Image Guided Fluid Drain By Catheter  Result Date: 09/12/2016 CLINICAL DATA:  Sigmoid diverticulitis with focal diverticular abscess. EXAM: CT GUIDED CATHETER DRAINAGE OF PERITONEAL ABSCESS ANESTHESIA/SEDATION: 1.5 mg IV Versed 50 mcg IV Fentanyl Total Moderate Sedation Time:  30 minutes The patient's level of consciousness and physiologic status were continuously monitored during the procedure by Radiology nursing. PROCEDURE: The procedure, risks, benefits, and alternatives were explained to the patient. Questions regarding the procedure were encouraged and answered. The patient understands and consents to the procedure. A time out was performed prior to initiating the procedure. The anterior abdominal wall was prepped with chlorhexidine in a sterile fashion, and a sterile drape was applied covering the operative field. A sterile gown and sterile gloves were used for  the procedure. Local anesthesia was provided with 1% Lidocaine. CT was performed in a supine position through the pelvis. Under CT guidance, an 18 gauge trocar needle was advanced to the level of a sigmoid diverticular abscess. After advancing a needle into the abscess, a guidewire was advanced. The tract was dilated and a 10 French percutaneous drainage catheter advanced over the wire. Catheter position was confirmed by CT. A fluid sample was aspirated and sent for culture analysis. The catheter was connected to suction bulb drainage. It was  secured at the skin with a Prolene retention suture and StatLock device. COMPLICATIONS: None FINDINGS: Anterior percutaneous window was present to the level of the diverticular abscess located medial to the sigmoid colon. Aspiration yielded bloody and purulent fluid. The drainage catheter was able to be formed in the abscess. IMPRESSION: CT-guided percutaneous drainage of diverticular peritoneal abscess located adjacent to the sigmoid colon. A sample of purulent fluid was sent for culture analysis. Electronically Signed   By: Aletta Edouard M.D.   On: 09/12/2016 16:45    Labs:  CBC:  Recent Labs  06/19/16 0542 06/28/16 0931 09/11/16 1030 09/12/16 0422  WBC 5.5 10.3 15.4* 11.0*  HGB 11.2* 13.5 15.2* 11.9*  HCT 35.8* 40.0 45.3 36.2  PLT 206 305.0 261 186    COAGS:  Recent Labs  06/14/16 2032 09/12/16 0422  INR 1.20 1.34  APTT 34  --     BMP:  Recent Labs  06/20/16 0546 06/28/16 0931 09/11/16 1030 09/12/16 0422 09/13/16 0513  NA 140 137 140 139 139  K 3.7 3.6 3.8 3.2* 3.9  CL 104 103 105 111 114*  CO2 '24 25 26 23 '$ 20*  GLUCOSE 100* 214* 204* 112* 164*  BUN <5* '8 16 10 10  '$ CALCIUM 9.1 9.0 9.5 7.9* 8.2*  CREATININE 0.57 0.71 0.92 0.70 0.66  GFRNONAA >60  --  >60 >60 >60  GFRAA >60  --  >60 >60 >60    LIVER FUNCTION TESTS:  Recent Labs  12/29/15 2327 06/14/16 1158 09/11/16 1030 09/12/16 0422  BILITOT 0.3 0.9 0.5 0.7  AST '24 22 17 '$ 14*  ALT '31 19 16 '$ 13*  ALKPHOS 92 105 86 67  PROT 6.8 7.8 6.9 5.2*  ALBUMIN 3.6 4.0 3.5 2.6*    Assessment and Plan:  Diverticular abscess drain intact Will follow  Electronically Signed: Aaditya Letizia A 09/13/2016, 7:33 AM   I spent a total of 15 Minutes at the the patient's bedside AND on the patient's hospital floor or unit, greater than 50% of which was counseling/coordinating care for divertic abscess

## 2016-09-13 NOTE — Progress Notes (Signed)
Patient ID: [name], female   DOB: 1946/02/16, 70 y.o.   MRN: [id number]  [facility] Surgery Progress Note     Subjective: Patient reports different and worsening pain than yesterday. Pain is lower abdominal as well as around the drain site. She was advanced to carb modified diet yesterday. Denies n/v with PO intake.  Objective: Vital signs in last 24 hours: Temp:  [97.5 F (36.4 C)-98.4 F (36.9 C)] 98.4 F (36.9 C) (11/21 0530) Pulse Rate:  [61-102] 101 (11/21 0530) Resp:  [14-25] 20 (11/21 0530) BP: (88-149)/(34-68) 118/48 (11/21 0530) SpO2:  [95 %-100 %] 95 % (11/21 0530) Weight:  [184 lb 3.2 oz (83.6 kg)] 184 lb 3.2 oz (83.6 kg) (11/21 0530) Last BM Date: 09/12/16  Intake/Output from previous day: 11/20 0701 - 11/21 0700 In: 3078.3 [I.V.:2958.3; IV Piggyback:100] Out: 8466 [Urine:1650; Drains:85] Intake/Output this shift: No intake/output data recorded.  PE: Gen:  Alert, NAD, cooperative Pulm: effort normal Abd: Soft, ND, +BS, no HSM, moderate global tenderness about abdomen, less tender when distracted, drain with serosangious/purulent drainage  Lab Results:   Recent Labs  09/11/16 1030 09/12/16 0422  WBC 15.4* 11.0*  HGB 15.2* 11.9*  HCT 45.3 36.2  PLT 261 186   BMET  Recent Labs  09/12/16 0422 09/13/16 0513  NA 139 139  K 3.2* 3.9  CL 111 114*  CO2 23 20*  GLUCOSE 112* 164*  BUN 10 10  CREATININE 0.70 0.66  CALCIUM 7.9* 8.2*   PT/INR  Recent Labs  09/12/16 0422  LABPROT 16.7*  INR 1.34   CMP     Component Value Date/Time   NA 139 09/13/2016 0513   NA 141 01/15/2015 0848   K 3.9 09/13/2016 0513   K 4.0 01/15/2015 0848   CL 114 (H) 09/13/2016 0513   CL 105 02/18/2013 0812   CO2 20 (L) 09/13/2016 0513   CO2 25 01/15/2015 0848   GLUCOSE 164 (H) 09/13/2016 0513   GLUCOSE 99 01/15/2015 0848   GLUCOSE 143 (H) 02/18/2013 0812   BUN 10 09/13/2016 0513   BUN 9.0 01/15/2015 0848   CREATININE 0.66 09/13/2016 0513   CREATININE 0.7 01/15/2015 0848   CALCIUM 8.2 (L) 09/13/2016 0513   CALCIUM 9.2 01/15/2015 0848   PROT 5.2 (L) 09/12/2016 0422   PROT 7.1 01/15/2015 0848   ALBUMIN 2.6 (L) 09/12/2016 0422   ALBUMIN 3.7 01/15/2015 0848   AST 14 (L) 09/12/2016 0422   AST 19 01/15/2015 0848   ALT 13 (L) 09/12/2016 0422   ALT 20 01/15/2015 0848   ALKPHOS 67 09/12/2016 0422   ALKPHOS 92 01/15/2015 0848   BILITOT 0.7 09/12/2016 0422   BILITOT 0.34 01/15/2015 0848   GFRNONAA >60 09/13/2016 0513   GFRAA >60 09/13/2016 0513   Lipase     Component Value Date/Time   LIPASE 29 09/11/2016 1030       Studies/Results: X-ray Chest Pa And Lateral  Result Date: 09/12/2016 CLINICAL DATA:  Preop EXAM: CHEST  2 VIEW COMPARISON:  09/07/2016 FINDINGS: Elevated right hemidiaphragm. Right medial upper lobe density again noted, unchanged. Left lung is clear. Heart is normal size. No acute bony abnormality. IMPRESSION: Stable right medial upper lobe density and elevated right hemidiaphragm. No acute findings. Electronically Signed   By: Rolm Baptise M.D.   On: 09/12/2016 13:25   Ct Abdomen Pelvis W Contrast  Result Date: 09/11/2016 CLINICAL DATA:  70 year old female with abdominal, pelvic and flank pain for 1 day. History of lung cancer,  appendectomy, cholecystectomy and hysterectomy. EXAM: CT ABDOMEN AND PELVIS WITH CONTRAST TECHNIQUE: Multidetector CT imaging of the abdomen and pelvis was performed using the standard protocol following bolus administration of intravenous contrast. CONTRAST:  150m ISOVUE-300 IOPAMIDOL (ISOVUE-300) INJECTION 61% COMPARISON:  06/14/2016 and prior exams. FINDINGS: Lower chest: No acute abnormality. Hepatobiliary: No acute or suspicious hepatic abnormality. The patient is status post cholecystectomy. Pancreas: Unremarkable Spleen: Unremarkable Adrenals/Urinary Tract: The kidneys, adrenal glands and bladder are unremarkable. Stomach/Bowel: Focal diverticulitis of the mid sigmoid colon is noted.  A 2.5 x 4.1 cm abscess along the medial aspect of the mid sigmoid colon is noted (image 73). Adjacent foci of free air and along the left pelvic side wall are noted. A small amount of free complex fluid within the pelvis is present. There is no evidence of bowel obstruction. No other focal areas of bowel wall thickening are present. The appendix is normal. Vascular/Lymphatic: Abdominal aortic atherosclerotic calcifications noted without aneurysm. No enlarged lymph nodes identified. Reproductive: Status post hysterectomy. No adnexal masses. Other: None Musculoskeletal: No acute or suspicious abnormalities. IMPRESSION: Focal diverticulitis of the mid sigmoid colon with adjacent 2.5 x 4.1 cm abscess and foci of pneumoperitoneum in this area. Small amount of complex free fluid within the pelvis. Abdominal aortic atherosclerosis. Electronically Signed   By: JMargarette CanadaM.D.   On: 09/11/2016 13:38   Ct Image Guided Fluid Drain By Catheter  Result Date: 09/12/2016 CLINICAL DATA:  Sigmoid diverticulitis with focal diverticular abscess. EXAM: CT GUIDED CATHETER DRAINAGE OF PERITONEAL ABSCESS ANESTHESIA/SEDATION: 1.5 mg IV Versed 50 mcg IV Fentanyl Total Moderate Sedation Time:  30 minutes The patient's level of consciousness and physiologic status were continuously monitored during the procedure by Radiology nursing. PROCEDURE: The procedure, risks, benefits, and alternatives were explained to the patient. Questions regarding the procedure were encouraged and answered. The patient understands and consents to the procedure. A time out was performed prior to initiating the procedure. The anterior abdominal wall was prepped with chlorhexidine in a sterile fashion, and a sterile drape was applied covering the operative field. A sterile gown and sterile gloves were used for the procedure. Local anesthesia was provided with 1% Lidocaine. CT was performed in a supine position through the pelvis. Under CT guidance, an 18 gauge  trocar needle was advanced to the level of a sigmoid diverticular abscess. After advancing a needle into the abscess, a guidewire was advanced. The tract was dilated and a 10 French percutaneous drainage catheter advanced over the wire. Catheter position was confirmed by CT. A fluid sample was aspirated and sent for culture analysis. The catheter was connected to suction bulb drainage. It was secured at the skin with a Prolene retention suture and StatLock device. COMPLICATIONS: None FINDINGS: Anterior percutaneous window was present to the level of the diverticular abscess located medial to the sigmoid colon. Aspiration yielded bloody and purulent fluid. The drainage catheter was able to be formed in the abscess. IMPRESSION: CT-guided percutaneous drainage of diverticular peritoneal abscess located adjacent to the sigmoid colon. A sample of purulent fluid was sent for culture analysis. Electronically Signed   By: GAletta EdouardM.D.   On: 09/12/2016 16:45    Anti-infectives: Anti-infectives    Start     Dose/Rate Route Frequency Ordered Stop   09/11/16 2200  piperacillin-tazobactam (ZOSYN) IVPB 3.375 g     3.375 g 12.5 mL/hr over 240 Minutes Intravenous Every 8 hours 09/11/16 1840     09/11/16 1400  piperacillin-tazobactam (ZOSYN) IVPB 3.375 g  3.375 g 100 mL/hr over 30 Minutes Intravenous  Once 09/11/16 1346 09/11/16 1502       Assessment/Plan Diverticulitis with abscess - CT: Focal diverticulitis of the mid sigmoid colon with adjacent 2.5 x 4.1 cm abscess and foci of pneumoperitoneum in this area. Small amount of complex free fluid within the pelvis. - s/p IR drain of abscess yesterday - gram stain: MODERATE WBC PRESENT,BOTH PMN AND MONONUCLEAR RARE GRAM NEGATIVE RODS; culture pending - will need colonoscopy 6 weeks after discharge  COPD with recent exacerbation in the setting of recent PNA , history of limited stage SCLCa , currently stable  DM HLD GERD  ID - [id number]>> VTE  - lovenox FEN - carb modified  Plan - continue drain and IV antibiotics. Follow cultures from yesterday. May need to decrease diet if pain persists.   LOS: 1 day    Jerrye Beavers , [facility] Surgery 09/13/2016, 9:27 AM Pager: [phone] Consults: [phone] Mon-Fri 7:00 am-4:30 pm Sat-Sun 7:00 am-11:30 am

## 2016-09-13 NOTE — Progress Notes (Signed)
CSW received consult regarding PT recommendation of SNF at discharge.  Patient is refusing SNF. She states that she is tired of being away from home. She lives with her husband, daughter, and grandchild. She would like home health services and has used [facility] before.   CSW signing off.   Percell Locus Ugo Thoma LCSWA [phone]

## 2016-09-14 ENCOUNTER — Inpatient Hospital Stay (HOSPITAL_COMMUNITY): Payer: Medicare Other

## 2016-09-14 ENCOUNTER — Encounter (HOSPITAL_COMMUNITY)
Admission: EM | Disposition: A | Discharge status: Skilled Nursing Facility | Payer: Self-pay | Source: Home / Self Care | Attending: Internal Medicine

## 2016-09-14 ENCOUNTER — Inpatient Hospital Stay (HOSPITAL_COMMUNITY): Payer: Medicare Other | Admitting: Certified Registered Nurse Anesthetist

## 2016-09-14 DIAGNOSIS — K5720 Diverticulitis of large intestine with perforation and abscess without bleeding: Secondary | ICD-10-CM | POA: Diagnosis present

## 2016-09-14 DIAGNOSIS — IMO0002 Reserved for concepts with insufficient information to code with codable children: Secondary | ICD-10-CM | POA: Diagnosis present

## 2016-09-14 DIAGNOSIS — I472 Ventricular tachycardia: Secondary | ICD-10-CM

## 2016-09-14 DIAGNOSIS — I2510 Atherosclerotic heart disease of native coronary artery without angina pectoris: Secondary | ICD-10-CM

## 2016-09-14 DIAGNOSIS — I469 Cardiac arrest, cause unspecified: Secondary | ICD-10-CM

## 2016-09-14 DIAGNOSIS — K651 Peritoneal abscess: Secondary | ICD-10-CM

## 2016-09-14 DIAGNOSIS — I4721 Torsades de pointes: Secondary | ICD-10-CM

## 2016-09-14 DIAGNOSIS — I5181 Takotsubo syndrome: Secondary | ICD-10-CM | POA: Clinically undetermined

## 2016-09-14 DIAGNOSIS — J9600 Acute respiratory failure, unspecified whether with hypoxia or hypercapnia: Secondary | ICD-10-CM

## 2016-09-14 DIAGNOSIS — I4901 Ventricular fibrillation: Secondary | ICD-10-CM

## 2016-09-14 DIAGNOSIS — I420 Dilated cardiomyopathy: Secondary | ICD-10-CM

## 2016-09-14 DIAGNOSIS — R079 Chest pain, unspecified: Secondary | ICD-10-CM

## 2016-09-14 HISTORY — PX: CARDIAC CATHETERIZATION: SHX172

## 2016-09-14 LAB — URINALYSIS, ROUTINE W REFLEX MICROSCOPIC
Bilirubin Urine: NEGATIVE
Glucose, UA: 100 mg/dL — AB
Ketones, ur: NEGATIVE mg/dL
LEUKOCYTES UA: NEGATIVE
NITRITE: NEGATIVE
PH: 7 (ref 5.0–8.0)
Protein, ur: 100 mg/dL — AB
SPECIFIC GRAVITY, URINE: 1.011 (ref 1.005–1.030)

## 2016-09-14 LAB — ECHOCARDIOGRAM COMPLETE
AOASC: 27 cm
AVLVOTPG: 7 mmHg
CHL CUP DOP CALC LVOT VTI: 25.7 cm
CHL CUP RV SYS PRESS: 38 mmHg
CHL CUP TV REG PEAK VELOCITY: 273 cm/s
EERAT: 11.89
EWDT: 185 ms
FS: 28 % (ref 28–44)
HEIGHTINCHES: 62 in
IVS/LV PW RATIO, ED: 0.95
LA diam index: 1.18 cm/m2
LA vol A4C: 34.8 ml
LASIZE: 23 mm
LAVOL: 29.9 mL
LAVOLIN: 15.4 mL/m2
LDCA: 3.46 cm2
LEFT ATRIUM END SYS DIAM: 23 mm
LV E/e' medial: 11.89
LV E/e'average: 11.89
LV TDI E'MEDIAL: 5.35
LV e' LATERAL: 6.67 cm/s
LVOT SV: 89 mL
LVOT diameter: 21 mm
LVOTPV: 129 cm/s
Lateral S' vel: 12.1 cm/s
MV Dec: 185
MV Peak grad: 3 mmHg
MV pk E vel: 79.3 m/s
MVPKAVEL: 115 m/s
PW: 11.8 mm — AB (ref 0.6–1.1)
RV TAPSE: 36.5 mm
TDI e' lateral: 6.67
TR max vel: 273 cm/s
WEIGHTICAEL: 2936 [oz_av]

## 2016-09-14 LAB — BASIC METABOLIC PANEL
Anion gap: 13 (ref 5–15)
BUN: 8 mg/dL (ref 6–20)
CHLORIDE: 111 mmol/L (ref 101–111)
CO2: 19 mmol/L — AB (ref 22–32)
CREATININE: 1 mg/dL (ref 0.44–1.00)
Calcium: 9.4 mg/dL (ref 8.9–10.3)
GFR calc Af Amer: >60 mL/min (ref >60)
GFR calc non Af Amer: 57 mL/min — ABNORMAL LOW (ref >60)
Glucose, Bld: 228 mg/dL — ABNORMAL HIGH (ref 65–99)
Potassium: 3.3 mmol/L — ABNORMAL LOW (ref 3.5–5.1)
Sodium: 143 mmol/L (ref 135–145)

## 2016-09-14 LAB — BLOOD GAS, ARTERIAL
Acid-base deficit: 3.6 mmol/L — ABNORMAL HIGH (ref 0.0–2.0)
Bicarbonate: 22.4 mmol/L (ref 20.0–28.0)
Drawn by: 35849
FIO2: 100
LHR: 16 {breaths}/min
MECHVT: 400 mL
O2 Saturation: 99.4 %
PEEP/CPAP: 8 cmH2O
PH ART: 7.261 — AB (ref 7.350–7.450)
Patient temperature: 98.6
pCO2 arterial: 51.5 mmHg — ABNORMAL HIGH (ref 32.0–48.0)
pO2, Arterial: 337 mmHg — ABNORMAL HIGH (ref 83.0–108.0)

## 2016-09-14 LAB — URINE MICROSCOPIC-ADD ON

## 2016-09-14 LAB — TROPONIN I
TROPONIN I: 0.12 ng/mL — AB (ref <0.03)
TROPONIN I: 0.52 ng/mL — AB (ref <0.03)

## 2016-09-14 LAB — CBC
HEMATOCRIT: 34.9 % — AB (ref 36.0–46.0)
HEMATOCRIT: 33.9 % — AB (ref 36.0–46.0)
HEMOGLOBIN: 11.2 g/dL — AB (ref 12.0–15.0)
Hemoglobin: 10.8 g/dL — ABNORMAL LOW (ref 12.0–15.0)
MCH: 29.6 pg (ref 26.0–34.0)
MCH: 29.7 pg (ref 26.0–34.0)
MCHC: 32.1 g/dL (ref 30.0–36.0)
MCHC: 31.9 g/dL (ref 30.0–36.0)
MCV: 92.1 fL (ref 78.0–100.0)
MCV: 93.1 fL (ref 78.0–100.0)
Platelets: 246 10*3/uL (ref 150–400)
Platelets: 247 10*3/uL (ref 150–400)
RBC: 3.79 MIL/uL — AB (ref 3.87–5.11)
RBC: 3.64 MIL/uL — ABNORMAL LOW (ref 3.87–5.11)
RDW: 14.8 % (ref 11.5–15.5)
RDW: 16.4 % — AB (ref 11.5–15.5)
WBC: 14.3 10*3/uL — ABNORMAL HIGH (ref 4.0–10.5)
WBC: 12 10*3/uL — ABNORMAL HIGH (ref 4.0–10.5)

## 2016-09-14 LAB — CREATININE, SERUM
Creatinine, Ser: 1.16 mg/dL — ABNORMAL HIGH (ref 0.44–1.00)
GFR calc non Af Amer: 47 mL/min — ABNORMAL LOW (ref >60)
GFR, EST AFRICAN AMERICAN: 55 mL/min — AB (ref >60)

## 2016-09-14 LAB — GLUCOSE, CAPILLARY
Glucose-Capillary: 137 mg/dL — ABNORMAL HIGH (ref 65–99)
Glucose-Capillary: 131 mg/dL — ABNORMAL HIGH (ref 65–99)
Glucose-Capillary: 204 mg/dL — ABNORMAL HIGH (ref 65–99)
Glucose-Capillary: 179 mg/dL — ABNORMAL HIGH (ref 65–99)
Glucose-Capillary: 191 mg/dL — ABNORMAL HIGH (ref 65–99)

## 2016-09-14 LAB — MAGNESIUM: MAGNESIUM: 2.1 mg/dL (ref 1.7–2.4)

## 2016-09-14 LAB — LACTIC ACID, PLASMA: LACTIC ACID, VENOUS: 5.6 mmol/L — AB (ref 0.5–1.9)

## 2016-09-14 LAB — MRSA PCR SCREENING: MRSA BY PCR: NEGATIVE

## 2016-09-14 LAB — SURGICAL PCR SCREEN
MRSA, PCR: NEGATIVE
Staphylococcus aureus: NEGATIVE

## 2016-09-14 LAB — PROCALCITONIN: Procalcitonin: 0.48 ng/mL

## 2016-09-14 SURGERY — LEFT HEART CATH AND CORONARY ANGIOGRAPHY
Anesthesia: LOCAL

## 2016-09-14 MED ORDER — LEVALBUTEROL HCL 0.63 MG/3ML IN NEBU
0.6300 mg | INHALATION_SOLUTION | RESPIRATORY_TRACT | Status: DC | PRN
  Administered 2016-09-21 – 2016-09-25 (×4): 0.63 mg via RESPIRATORY_TRACT
  Filled 2016-09-14 (×5): qty 3

## 2016-09-14 MED ORDER — ORAL CARE MOUTH RINSE
15.0000 mL | OROMUCOSAL | Status: DC
  Administered 2016-09-14 – 2016-09-16 (×14): 15 mL via OROMUCOSAL

## 2016-09-14 MED ORDER — FENTANYL CITRATE (PF) 100 MCG/2ML IJ SOLN
INTRAMUSCULAR | Status: AC
  Administered 2016-09-14: 200 ug via INTRAVENOUS
  Filled 2016-09-14: qty 4

## 2016-09-14 MED ORDER — SODIUM CHLORIDE 0.9 % IV SOLN: INTRAVENOUS | Status: AC

## 2016-09-14 MED ORDER — ETOMIDATE 2 MG/ML IV SOLN
INTRAVENOUS | Status: AC
  Administered 2016-09-14: 08:00:00
  Filled 2016-09-14: qty 10

## 2016-09-14 MED ORDER — SODIUM CHLORIDE 0.9% FLUSH: 3.0000 mL | INTRAVENOUS | Status: DC | PRN

## 2016-09-14 MED ORDER — IOPAMIDOL (ISOVUE-370) INJECTION 76%
INTRAVENOUS | Status: AC
  Filled 2016-09-14: qty 100

## 2016-09-14 MED ORDER — LIDOCAINE HCL (PF) 1 % IJ SOLN
INTRAMUSCULAR | Status: AC
  Filled 2016-09-14: qty 30

## 2016-09-14 MED ORDER — HEPARIN SODIUM (PORCINE) 5000 UNIT/ML IJ SOLN: 5000.0000 [IU] | Freq: Three times a day (TID) | INTRAMUSCULAR | Status: DC

## 2016-09-14 MED ORDER — LIDOCAINE HCL (PF) 1 % IJ SOLN
INTRAMUSCULAR | Status: DC | PRN
  Administered 2016-09-14: 16 mL
  Administered 2016-09-14 (×2): 9 mL

## 2016-09-14 MED ORDER — SODIUM CHLORIDE 0.9 % IV SOLN: 250.0000 mL | INTRAVENOUS | Status: DC | PRN

## 2016-09-14 MED ORDER — INSULIN ASPART 100 UNIT/ML ~~LOC~~ SOLN
0.0000 [IU] | SUBCUTANEOUS | Status: DC
  Administered 2016-09-14: 4 [IU] via SUBCUTANEOUS
  Administered 2016-09-14: 7 [IU] via SUBCUTANEOUS
  Administered 2016-09-15 (×2): 3 [IU] via SUBCUTANEOUS
  Administered 2016-09-15 (×2): 4 [IU] via SUBCUTANEOUS
  Administered 2016-09-15: 3 [IU] via SUBCUTANEOUS

## 2016-09-14 MED ORDER — ACETAMINOPHEN 325 MG PO TABS: 650.0000 mg | ORAL_TABLET | ORAL | Status: DC | PRN

## 2016-09-14 MED ORDER — VANCOMYCIN HCL IN DEXTROSE 1-5 GM/200ML-% IV SOLN
1000.0000 mg | Freq: Two times a day (BID) | INTRAVENOUS | Status: DC
  Administered 2016-09-14 – 2016-09-15 (×4): 1000 mg via INTRAVENOUS
  Filled 2016-09-14 (×6): qty 200

## 2016-09-14 MED ORDER — MIDAZOLAM HCL 2 MG/2ML IJ SOLN
1.0000 mg | INTRAMUSCULAR | Status: DC | PRN
  Administered 2016-09-14: 1 mg via INTRAVENOUS
  Filled 2016-09-14: qty 2

## 2016-09-14 MED ORDER — SODIUM CHLORIDE 0.9 % IV SOLN
100.0000 mg | INTRAVENOUS | Status: DC
  Administered 2016-09-15: 100 mg via INTRAVENOUS
  Filled 2016-09-14: qty 100

## 2016-09-14 MED ORDER — SODIUM CHLORIDE 0.9 % IV SOLN
250.0000 mL | INTRAVENOUS | Status: DC | PRN
  Administered 2016-09-18: 250 mL via INTRAVENOUS

## 2016-09-14 MED ORDER — SODIUM CHLORIDE 0.9 % IV SOLN
30.0000 meq | Freq: Once | INTRAVENOUS | Status: AC
  Administered 2016-09-14: 30 meq via INTRAVENOUS
  Filled 2016-09-14: qty 15

## 2016-09-14 MED ORDER — CHLORHEXIDINE GLUCONATE 0.12% ORAL RINSE (MEDLINE KIT)
15.0000 mL | Freq: Two times a day (BID) | OROMUCOSAL | Status: DC
  Administered 2016-09-14 – 2016-09-16 (×3): 15 mL via OROMUCOSAL

## 2016-09-14 MED ORDER — FAMOTIDINE IN NACL 20-0.9 MG/50ML-% IV SOLN
20.0000 mg | INTRAVENOUS | Status: DC
  Administered 2016-09-14 – 2016-09-20 (×7): 20 mg via INTRAVENOUS
  Filled 2016-09-14 (×7): qty 50

## 2016-09-14 MED ORDER — FENTANYL CITRATE (PF) 100 MCG/2ML IJ SOLN
50.0000 ug | Freq: Once | INTRAMUSCULAR | Status: AC
  Administered 2016-09-15: 50 ug via INTRAVENOUS
  Filled 2016-09-14: qty 2

## 2016-09-14 MED ORDER — NOREPINEPHRINE BITARTRATE 1 MG/ML IV SOLN
0.0000 ug/min | INTRAVENOUS | Status: DC
  Filled 2016-09-14: qty 16

## 2016-09-14 MED ORDER — DEXTROSE 5 % IV SOLN
0.0000 ug/min | INTRAVENOUS | Status: DC
  Administered 2016-09-14: 40 ug/min via INTRAVENOUS
  Administered 2016-09-14: 80 ug/min via INTRAVENOUS
  Administered 2016-09-14: 40 ug/min via INTRAVENOUS
  Filled 2016-09-14 (×6): qty 1

## 2016-09-14 MED ORDER — FENTANYL CITRATE (PF) 100 MCG/2ML IJ SOLN
INTRAMUSCULAR | Status: AC
  Administered 2016-09-14: 100 ug via INTRAVENOUS
  Filled 2016-09-14: qty 2

## 2016-09-14 MED ORDER — SODIUM CHLORIDE 0.9 % IV SOLN
200.0000 mg | Freq: Once | INTRAVENOUS | Status: AC
  Administered 2016-09-14: 200 mg via INTRAVENOUS
  Filled 2016-09-14: qty 200

## 2016-09-14 MED ORDER — HEPARIN (PORCINE) IN NACL 2-0.9 UNIT/ML-% IJ SOLN
INTRAMUSCULAR | Status: AC
  Filled 2016-09-14: qty 1000

## 2016-09-14 MED ORDER — METOPROLOL TARTRATE 5 MG/5ML IV SOLN
INTRAVENOUS | Status: AC
  Filled 2016-09-14: qty 5

## 2016-09-14 MED ORDER — FENTANYL BOLUS VIA INFUSION
25.0000 ug | INTRAVENOUS | Status: DC | PRN
  Filled 2016-09-14: qty 25

## 2016-09-14 MED ORDER — FENTANYL 2500MCG IN NS 250ML (10MCG/ML) PREMIX INFUSION
25.0000 ug/h | INTRAVENOUS | Status: DC
  Administered 2016-09-14: 100 ug/h via INTRAVENOUS
  Administered 2016-09-14: 175 ug/h via INTRAVENOUS
  Administered 2016-09-15: 75 ug/h via INTRAVENOUS
  Filled 2016-09-14 (×3): qty 250

## 2016-09-14 MED ORDER — FENTANYL CITRATE (PF) 100 MCG/2ML IJ SOLN
200.0000 ug | Freq: Once | INTRAMUSCULAR | Status: AC
  Administered 2016-09-14: 200 ug via INTRAVENOUS

## 2016-09-14 MED ORDER — HEPARIN (PORCINE) IN NACL 2-0.9 UNIT/ML-% IJ SOLN
INTRAMUSCULAR | Status: DC | PRN
  Administered 2016-09-14: 1000 mL

## 2016-09-14 MED ORDER — MIDAZOLAM HCL 2 MG/2ML IJ SOLN
INTRAMUSCULAR | Status: AC
  Administered 2016-09-14: 2 mg via INTRAVENOUS
  Filled 2016-09-14: qty 2

## 2016-09-14 MED ORDER — ASPIRIN 81 MG PO CHEW
81.0000 mg | CHEWABLE_TABLET | Freq: Every day | ORAL | Status: DC
  Administered 2016-09-15 – 2016-09-25 (×11): 81 mg via ORAL
  Filled 2016-09-14 (×12): qty 1

## 2016-09-14 MED ORDER — MIDAZOLAM HCL 2 MG/2ML IJ SOLN
2.0000 mg | Freq: Once | INTRAMUSCULAR | Status: AC
  Administered 2016-09-14: 2 mg via INTRAVENOUS

## 2016-09-14 MED ORDER — HYDROCORTISONE NA SUCCINATE PF 100 MG IJ SOLR
50.0000 mg | Freq: Four times a day (QID) | INTRAMUSCULAR | Status: DC
  Administered 2016-09-14 – 2016-09-15 (×4): 50 mg via INTRAVENOUS
  Filled 2016-09-14 (×4): qty 2

## 2016-09-14 MED ORDER — IOPAMIDOL (ISOVUE-300) INJECTION 61%
INTRAVENOUS | Status: AC
  Administered 2016-09-14: 30 mL
  Filled 2016-09-14: qty 100

## 2016-09-14 MED ORDER — HEPARIN SODIUM (PORCINE) 5000 UNIT/ML IJ SOLN
5000.0000 [IU] | Freq: Three times a day (TID) | INTRAMUSCULAR | Status: DC
  Administered 2016-09-15 – 2016-09-21 (×19): 5000 [IU] via SUBCUTANEOUS
  Filled 2016-09-14 (×19): qty 1

## 2016-09-14 MED ORDER — IOPAMIDOL (ISOVUE-300) INJECTION 61%
INTRAVENOUS | Status: AC
  Administered 2016-09-14: 100 mL
  Filled 2016-09-14: qty 30

## 2016-09-14 MED ORDER — MIDAZOLAM HCL 2 MG/2ML IJ SOLN: 1.0000 mg | INTRAMUSCULAR | Status: DC | PRN

## 2016-09-14 MED ORDER — ONDANSETRON HCL 4 MG/2ML IJ SOLN
4.0000 mg | Freq: Four times a day (QID) | INTRAMUSCULAR | Status: DC | PRN
  Filled 2016-09-14: qty 2

## 2016-09-14 MED ORDER — SODIUM CHLORIDE 0.9% FLUSH
3.0000 mL | Freq: Two times a day (BID) | INTRAVENOUS | Status: DC
  Administered 2016-09-15: 3 mL via INTRAVENOUS

## 2016-09-14 MED ORDER — SODIUM CHLORIDE 0.9% FLUSH
3.0000 mL | Freq: Two times a day (BID) | INTRAVENOUS | Status: DC
  Administered 2016-09-14: 3 mL via INTRAVENOUS

## 2016-09-14 SURGICAL SUPPLY — 11 items
CATH INFINITI 5FR MULTPACK ANG (CATHETERS) ×1 IMPLANT
HOVERMATT SINGLE USE (MISCELLANEOUS) ×1 IMPLANT
KIT HEART LEFT (KITS) ×2 IMPLANT
NDL SMART 18GX3.5CM LONG (NEEDLE) IMPLANT
NEEDLE SMART 18GX3.5CM LONG (NEEDLE) ×2 IMPLANT
PACK CARDIAC CATHETERIZATION (CUSTOM PROCEDURE TRAY) ×2 IMPLANT
SHEATH PINNACLE 5F 10CM (SHEATH) ×1 IMPLANT
SYR MEDRAD MARK V 150ML (SYRINGE) ×2 IMPLANT
TRANSDUCER W/STOPCOCK (MISCELLANEOUS) ×2 IMPLANT
TUBING CIL FLEX 10 FLL-RA (TUBING) ×2 IMPLANT
WIRE EMERALD 3MM-J .035X150CM (WIRE) ×1 IMPLANT

## 2016-09-14 NOTE — Anesthesia Procedure Notes (Signed)
Performed by: Donnalee Cellucci P       

## 2016-09-14 NOTE — Procedures (Signed)
Echo limited  1. Poor images 2. rv seemed normal kinetic and NOT dilated 3. LV image only was parasternal short and possible sental hypokinesis and ? Some anterio? 4. Minor to no effusion  Lavon Paganini. Titus Mould, MD, [place] Pgr: [facility]

## 2016-09-14 NOTE — Progress Notes (Signed)
Patient ID: [name], female   DOB: 07/14/1946, 70 y.o.   MRN: [id number]  [facility] Surgery Progress Note     Subjective: Patient coded this AM and was intubated post-CPR. Transferred to ICU  Objective: Vital signs in last 24 hours: Temp:  [97.5 F (36.4 C)-97.7 F (36.5 C)] 97.5 F (36.4 C) (11/22 0443) Pulse Rate:  [63-108] 108 (11/22 0736) Resp:  [17-25] 25 (11/22 0736) BP: (102-111)/(53-69) 107/69 (11/22 0443) SpO2:  [94 %-100 %] 100 % (11/22 0736) FiO2 (%):  [100 %] 100 % (11/22 0736) Weight:  [183 lb 8 oz (83.2 kg)] 183 lb 8 oz (83.2 kg) (11/22 0443) Last BM Date: 09/13/16  Intake/Output from previous day: 11/21 0701 - 11/22 0700 In: 2725 [I.V.:2575; IV Piggyback:150] Out: 180 [Urine:156; Drains:18; Stool:6] Intake/Output this shift: No intake/output data recorded.  PE: Gen:  Drowsy but alert, NAD, intubated Pulm:  Intubated, breath sound equal bilaterally, secretions heard Abd: Soft, ND, +BS, no HSM, mild left sided tenderness, drain with serosangious/purulent drainage  Lab Results:   Recent Labs  09/12/16 0422 09/14/16 0716  WBC 11.0* 14.3*  HGB 11.9* 11.2*  HCT 36.2 34.9*  PLT 186 246   BMET  Recent Labs  09/13/16 0513 09/14/16 0716  NA 139 143  K 3.9 3.3*  CL 114* 111  CO2 20* 19*  GLUCOSE 164* 228*  BUN 10 8  CREATININE 0.66 1.00  CALCIUM 8.2* 9.4   PT/INR  Recent Labs  09/12/16 0422  LABPROT 16.7*  INR 1.34   CMP     Component Value Date/Time   NA 143 09/14/2016 0716   NA 141 01/15/2015 0848   K 3.3 (L) 09/14/2016 0716   K 4.0 01/15/2015 0848   CL 111 09/14/2016 0716   CL 105 02/18/2013 0812   CO2 19 (L) 09/14/2016 0716   CO2 25 01/15/2015 0848   GLUCOSE 228 (H) 09/14/2016 0716   GLUCOSE 99 01/15/2015 0848   GLUCOSE 143 (H) 02/18/2013 0812   BUN 8 09/14/2016 0716   BUN 9.0 01/15/2015 0848   CREATININE 1.00 09/14/2016 0716   CREATININE 0.7 01/15/2015 0848   CALCIUM 9.4 09/14/2016 0716   CALCIUM 9.2  01/15/2015 0848   PROT 5.2 (L) 09/12/2016 0422   PROT 7.1 01/15/2015 0848   ALBUMIN 2.6 (L) 09/12/2016 0422   ALBUMIN 3.7 01/15/2015 0848   AST 14 (L) 09/12/2016 0422   AST 19 01/15/2015 0848   ALT 13 (L) 09/12/2016 0422   ALT 20 01/15/2015 0848   ALKPHOS 67 09/12/2016 0422   ALKPHOS 92 01/15/2015 0848   BILITOT 0.7 09/12/2016 0422   BILITOT 0.34 01/15/2015 0848   GFRNONAA 57 (L) 09/14/2016 0716   GFRAA >60 09/14/2016 0716   Lipase     Component Value Date/Time   LIPASE 29 09/11/2016 1030       Studies/Results: X-ray Chest Pa And Lateral  Result Date: 09/12/2016 CLINICAL DATA:  Preop EXAM: CHEST  2 VIEW COMPARISON:  09/07/2016 FINDINGS: Elevated right hemidiaphragm. Right medial upper lobe density again noted, unchanged. Left lung is clear. Heart is normal size. No acute bony abnormality. IMPRESSION: Stable right medial upper lobe density and elevated right hemidiaphragm. No acute findings. Electronically Signed   By: Rolm Baptise M.D.   On: 09/12/2016 13:25   Dg Chest Port 1 View  Result Date: 09/14/2016 CLINICAL DATA:  Central line placement EXAM: PORTABLE CHEST 1 VIEW COMPARISON:  09/14/2016 FINDINGS: Cardiomediastinal silhouette is stable. Stable endotracheal tube position. NG tube  in place. There is persistent consolidation in right upper lobe. Left IJ central line with tip in distal SVC. No pneumothorax. IMPRESSION: Stable support apparatus. Left IJ central line in place. No pneumothorax. Electronically Signed   By: [name] M.D.   On: 09/14/2016 09:09   Dg Chest Port 1 View  Result Date: 09/14/2016 CLINICAL DATA:  Hypoxia EXAM: PORTABLE CHEST 1 VIEW COMPARISON:  September 12, 2016 chest radiograph and chest CT June 14, 2016 FINDINGS: Endotracheal tube tip is 3.1 cm above the carina. No pneumothorax. There is persistent airspace consolidation throughout much of the right upper lobe. There is suspected residual mass in this area medially. The lungs elsewhere are clear.  Heart size and pulmonary vascularity are normal. No adenopathy. There is atherosclerotic calcification in the aorta. No bone lesions. Incidental note is made of small cervical ribs bilaterally. IMPRESSION: Endotracheal tube as described without pneumothorax. There is airspace consolidation throughout much the right upper lobe. There is a nodular area in the medial aspect of the right upper lobe measuring 2.3 x 2.0 cm, concerning for residual mass in this area. This finding may warrant correlation with contrast enhanced chest CT to further assess. 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 Lungs elsewhere clear. Stable cardiac silhouette. There is aortic atherosclerosis. Electronically Signed   By: Lowella Grip III M.D.   On: 09/14/2016 08:06   Ct Image Guided Fluid Drain By Catheter  Result Date: 09/12/2016 CLINICAL DATA:  Sigmoid diverticulitis with focal diverticular abscess. EXAM: CT GUIDED CATHETER DRAINAGE OF PERITONEAL ABSCESS ANESTHESIA/SEDATION: 1.5 mg IV Versed 50 mcg IV Fentanyl Total Moderate Sedation Time:  30 minutes The patient's level of consciousness and physiologic status were continuously monitored during the procedure by Radiology nursing. PROCEDURE: The procedure, risks, benefits, and alternatives were explained to the patient. Questions regarding the procedure were encouraged and answered. The patient understands and consents to the procedure. A time out was performed prior to initiating the procedure. The anterior abdominal wall was prepped with chlorhexidine in a sterile fashion, and a sterile drape was applied covering the operative field. A sterile gown and sterile gloves were used for the procedure. Local anesthesia was provided with 1% Lidocaine. CT was performed in a supine position through the pelvis. Under CT guidance, an 18 gauge trocar needle was advanced to the level of a sigmoid diverticular abscess. After advancing  a needle into the abscess, a guidewire was advanced. The tract was dilated and a 10 French percutaneous drainage catheter advanced over the wire. Catheter position was confirmed by CT. A fluid sample was aspirated and sent for culture analysis. The catheter was connected to suction bulb drainage. It was secured at the skin with a Prolene retention suture and StatLock device. COMPLICATIONS: None FINDINGS: Anterior percutaneous window was present to the level of the diverticular abscess located medial to the sigmoid colon. Aspiration yielded bloody and purulent fluid. The drainage catheter was able to be formed in the abscess. IMPRESSION: CT-guided percutaneous drainage of diverticular peritoneal abscess located adjacent to the sigmoid colon. A sample of purulent fluid was sent for culture analysis. Electronically Signed   By: Aletta Edouard M.D.   On: 09/12/2016 16:45    Anti-infectives: Anti-infectives    Start     Dose/Rate Route Frequency Ordered Stop   09/15/16 1000  anidulafungin (ERAXIS) 100 mg in sodium chloride 0.9 % 100 mL IVPB     100 mg over 90 Minutes Intravenous  Every 24 hours 09/14/16 0906     09/14/16 1000  anidulafungin (ERAXIS) 200 mg in sodium chloride 0.9 % 200 mL IVPB     200 mg over 180 Minutes Intravenous  Once 09/14/16 0841     09/14/16 0800  vancomycin (VANCOCIN) IVPB 1000 mg/200 mL premix     1,000 mg 200 mL/hr over 60 Minutes Intravenous Every 12 hours 09/14/16 0737     09/11/16 2200  piperacillin-tazobactam (ZOSYN) IVPB 3.375 g     3.375 g 12.5 mL/hr over 240 Minutes Intravenous Every 8 hours 09/11/16 1840     09/11/16 1400  piperacillin-tazobactam (ZOSYN) IVPB 3.375 g     3.375 g 100 mL/hr over 30 Minutes Intravenous  Once 09/11/16 1346 09/11/16 1502       Assessment/Plan Diverticulitis with abscess - CT: Focal diverticulitis of the mid sigmoid colon with adjacent 2.5 x 4.1 cm abscess and foci of pneumoperitoneum in this area. Small amount of complex free fluid  within the pelvis. - s/p IR drain of abscess 11/20 - gram stain: MODERATE WBC PRESENT,BOTH PMN AND MONONUCLEAR RARE GRAM NEGATIVE RODS; culture pending  COPD with recent exacerbation in the setting of recent PNA , history of limited stage SCLCa DM HLD GERD  ID - zosyn11/19>>, vanc 11/22>> VTE - lovenox FEN - NPO  Plan - patient coded and was intubated this AM, transferred to ICU. WBC slightly up to 14.3, lactic acid 5.6, troponin 0.12. CT abdomen/pelvis pending. Continue drain and IV antibiotics. Follow cultures.    LOS: 2 days    Jerrye Beavers , [facility] 09/14/2016, 9:20 AM Pager: [phone] Consults: [phone] Mon-Fri 7:00 am-4:30 pm Sat-Sun 7:00 am-11:30 am

## 2016-09-14 NOTE — Interval H&P Note (Signed)
Cath Lab Visit (complete for each Cath Lab visit)  Clinical Evaluation Leading to the Procedure:   ACS: Yes.    Non-ACS:    Anginal Classification: CCS IV  Anti-ischemic medical therapy: No Therapy  Non-Invasive Test Results: No non-invasive testing performed  Prior CABG: No previous CABG      History and Physical Interval Note:  09/14/2016 3:03 PM  [name]  has presented today for surgery, with the diagnosis of cardiac arrest  The various methods of treatment have been discussed with the patient and family. After consideration of risks, benefits and other options for treatment, the patient has consented to  Procedure(s): Left Heart Cath and Coronary Angiography (N/A) as a surgical intervention .  The patient's history has been reviewed, patient examined, no change in status, stable for surgery.  I have reviewed the patient's chart and labs.  Questions were answered to the patient's satisfaction.     Shelva Majestic

## 2016-09-14 NOTE — Progress Notes (Signed)
   09/14/16 0700  Clinical Encounter Type  Visited With Patient not available  Visit Type Code  Referral From Other (Comment) (Code Blue)  Consult/Referral To Chaplain  [facility] responded to Code Blue. Contacted patient's daughter who is in route with patient's father.  Patient moved to 4n. [facility] will follow up when they arrive. Roe Coombs 09/14/16

## 2016-09-14 NOTE — Code Documentation (Signed)
  Patient Name: [name]   MRN: [id number]   Date of Birth/ Sex: [birth year] , female      Admission Date: 09/11/2016  Attending Provider: Charlynne Cousins, MD  Primary Diagnosis: Diverticulitis of sigmoid colon with abscess   Indication: Pt was in her usual state of health until this AM, when she was noted to be in PEA. Code blue was subsequently called. At the time of arrival on scene, ACLS protocol was underway.    Technical Description:  - CPR performance duration:  8 minutes  - Was defibrillation or cardioversion used? Yes   - Was external pacer placed? No  - Was patient intubated pre/post CPR? Yes   Medications Administered: Y = Yes; Blank = No Amiodarone    Atropine    Calcium  x1  Epinephrine  x3  Lidocaine    Magnesium    Norepinephrine    Phenylephrine    Sodium bicarbonate  x1  Vasopressin     Post CPR evaluation:  - Final Status - Was patient successfully resuscitated ? Yes - What is current rhythm? Sinus rhythm with PAC's and PVC's - What is current hemodynamic status? guarded  Miscellaneous Information:  - Labs sent, including: CBC, BMP, Troponin, Magnesium, lactic acid  - Primary team notified?  Yes  - Family Notified? Chaplain was attempting to contact family when team left  - Additional notes/ transfer status: Patient to be transferred to [place], MD  09/14/2016, 7:24 AM

## 2016-09-14 NOTE — Consult Note (Signed)
Cardiology Consult    Patient ID: [name] MRN: [id number], DOB/AGE: June 09, 1946   Admit date: 09/11/2016 Date of Consult: 09/14/2016  Primary Physician: [name], MD Reason for Consult: Cardiac arrest with ROSC after 6 minutes Primary Cardiologist: Previously Dr. Lia Foyer - never seen in office Requesting Provider: Dr. Titus Mould  Patient Profile    [name] is a 70 year old female with a past medical history significant for nonobstructive CAD (cath 2006 and again in 2013 with normal LV function, mild, non-obstructive CAD noted), HTN, DM 2, HLD, COPD, former smoker, Hx of non-small cell lung cater s/p chemo and radiation, hx of diverticulitis who initially presented on 11/19 with LLQ pain and found to have diverticulitis with abscess formation. Drain placed. 11/22 am when into cardiac arrest. Intubated and transferred to the ICU.   History of Present Illness    [name] initially presented on 11/19 with complaints of severe LLQ pain. Found to have diverticulitis in the mid sigmoid colon with abscess formation and a small fossae of pneumoperitoneum in the area. [name] was started on Zosyn at that time. Underwent IR guided drainage with drain placed on 11/20. [name] had worsening abdominal pain, uncontrolled on [name] pain medications on 11/21. On 11/22 am, [name] went into SVT with HR in the 190s and associated hypotension. Subsequently went into cardiac arrest with 6 minutes of CPR before attaining ROSC. Reportedly went into PEA arrest with subsequent V. Fib. Debrillation x 2 for V.Fib. [name] was intubated during the code. Awake post arrest but remained hypotensive; started on phenylephrine gtt. Transferred to the ICU.   [name] is awake and able to follow commands. Denies any chest pain prior to the episode. Now having sharp stabbing pain in [name] left chest. Non-radiating. Denies any preceding symptoms. No sob, nausea, palpations or diaphoresis.   Past Medical History   Past  Medical History:  Diagnosis Date  . Age-related nuclear cataract of both eyes 2016   +cortical age related cataracts OU   . Allergy    seasonal  . Arthritis    hands  . Bilateral diabetic retinopathy (HCC) 2015   Dr. Zigmund Daniel  . Blood transfusion without reported diagnosis    when taking chemo  . CAD (coronary artery disease)    NON obstructive. Cath 2006 preserved LV fxn, scattered irregularities without critical stenosis. 2008 stress echo negative for ischemia, but with hypertensive response  . COPD (chronic obstructive pulmonary disease) ([place])   . Diabetes mellitus with complication (HCC)    diab retinopathy OU (laser)  . Diverticulitis 05/2016  . GERD (gastroesophageal reflux disease)    protonix  . History of diverticulitis of colon   . Hyperlipidemia   . Hypertension   . Lung cancer ([place])    non-small cell lung ca, stage III in 05/2011; systemic chemotherapy concurrent with radiation followed by prophylactic cranial irradiation and has been observation since July of 2010 with no evidence for disease recurrence-released from onc f/u 12/2014 (needs annual cxr by PCP).  CXR 08/2015 stable.  . Mild cognitive impairment with memory loss    Likely from brain radiation therapy  . Open-angle glaucoma 2016   Dr. Shirley Muscat, (bilateral)---responding to topical therapy  . Osteopenia 03/2016   03/2016 DEXA T-score -2.2    Past Surgical History:  Procedure Laterality Date  . ABDOMINAL HYSTERECTOMY  1997  . APPENDECTOMY    . CARPAL TUNNEL RELEASE    . CATARACT EXTRACTION    . CHOLECYSTECTOMY  2002  .  COLONOSCOPY  10/24/00   normal.  [employer] hemoccult testing via [place] 06/18/15 was NEG  . LEFT HEART CATHETERIZATION WITH CORONARY ANGIOGRAM N/A 05/30/2012   Procedure: LEFT HEART CATHETERIZATION WITH CORONARY ANGIOGRAM;  Surgeon: Burnell Blanks, MD;  Location: [facility] CATH LAB;  Service: Cardiovascular;  Laterality: N/A;     Allergies  Allergies  Allergen Reactions  . Lactose  Intolerance (Gi) Diarrhea  . Ibuprofen     REACTION: Anxiousness, hyperventilates    Inpatient Medications    . [START ON 09/15/2016] anidulafungin  100 mg Intravenous Q24H  . anidulafungin  200 mg Intravenous Once  . aspirin EC  81 mg Oral QHS  . etomidate      . famotidine (PEPCID) IV  20 mg Intravenous Q24H  . fentaNYL (SUBLIMAZE) injection  50 mcg Intravenous Once  . heparin subcutaneous  5,000 Units Subcutaneous Q8H  . hydrocortisone sodium succinate  50 mg Intravenous Q6H  . insulin aspart  0-20 Units Subcutaneous Q4H  . ketorolac  1 drop Left Eye QID  . piperacillin-tazobactam (ZOSYN)  IV  3.375 g Intravenous Q8H  . potassium chloride (KCL MULTIRUN) 30 mEq in 265 mL IVPB  30 mEq Intravenous Once  . sodium chloride flush  3 mL Intravenous Q12H  . vancomycin  1,000 mg Intravenous Q12H    Family History    Family History  Problem Relation Age of Onset  . Heart failure Mother   . Kidney disease Mother     renal failure  . Diabetes Mother   . Diabetes Father   . Diabetes Brother   . Heart disease Brother   . Heart attack Brother   . Heart attack Brother   . Heart attack Brother   . Colon cancer Neg Hx     Social History    Social History   Social History  . Marital status: Married    Spouse name: N/A  . Number of children: N/A  . Years of education: N/A   Occupational History  . Retired Systems analyst Unemployed   Social History Main Topics  . Smoking status: Former Smoker    Packs/day: 1.00    Years: 45.00    Types: Cigarettes    Quit date: 10/24/2008  . Smokeless tobacco: Never Used  . Alcohol use No  . Drug use: No  . Sexual activity: Not on file   Other Topics Concern  . Not on file   Social History Narrative   Lives in [place], has husband and daughter and son. Daughter's family is living with [name] and husband at present. [name] is ambulatory daily without cane or walker.      Review of Systems    General:  No chills, fever, night sweats  or weight changes.  Cardiovascular:  +chest pain, no dyspnea on exertion, edema, orthopnea, palpitations, paroxysmal nocturnal dyspnea. Dermatological: No rash, lesions/masses Respiratory: No cough, dyspnea Urologic: No hematuria, dysuria Abdominal:   No nausea, vomiting, diarrhea, bright red blood per rectum, melena, or hematemesis Neurologic:  No visual changes, wkns, changes in mental status. All other systems reviewed and are otherwise negative except as noted above.  Physical Exam    Blood pressure (!) 119/55, pulse 80, temperature 98.4 F (36.9 C), temperature source Oral, resp. rate (!) 23, height '5\' 2"'$  (1.575 m), weight 83.2 kg (183 lb 8 oz), SpO2 100 %.  General: chronically ill appearing female, intubated, NAD Psych: Normal affect. Neuro: Following commands, nods yes or no appropriately. Moves all extremities spontaneously. HEENT: Normal  Neck: Supple without bruits or JVD. Lungs:  Resp regular and unlabored on vent, coarse breath sounds throughout Heart: RRR no s3, s4, or murmurs. Abdomen: Soft, non-tender, non-distended, BS + x 4.  Extremities: No clubbing, cyanosis or edema. DP/PT/Radials 2+ and equal bilaterally.  Labs     Recent Labs  09/14/16 0716  TROPONINI 0.12*   Lab Results  Component Value Date   WBC 14.3 (H) 09/14/2016   HGB 11.2 (L) 09/14/2016   HCT 34.9 (L) 09/14/2016   MCV 92.1 09/14/2016   PLT 246 09/14/2016    Recent Labs Lab 09/12/16 0422  09/14/16 0716  NA 139  < > 143  K 3.2*  < > 3.3*  CL 111  < > 111  CO2 23  < > 19*  BUN 10  < > 8  CREATININE 0.70  < > 1.00  CALCIUM 7.9*  < > 9.4  PROT 5.2*  --   --   BILITOT 0.7  --   --   ALKPHOS 67  --   --   ALT 13*  --   --   AST 14*  --   --   GLUCOSE 112*  < > 228*  < > = values in this interval not displayed. Lab Results  Component Value Date   CHOL 213 (H) 07/17/2015   HDL 32.50 (L) 07/17/2015   LDLCALC 156 (H) 03/03/2015   TRIG 212.0 (H) 07/17/2015   Lab Results  Component  Value Date   DDIMER 1.09 (H) 09/14/2015     Radiology Studies    X-ray Chest Pa And Lateral  Result Date: 09/12/2016 CLINICAL DATA:  Preop EXAM: CHEST  2 VIEW COMPARISON:  09/07/2016 FINDINGS: Elevated right hemidiaphragm. Right medial upper lobe density again noted, unchanged. Left lung is clear. Heart is normal size. No acute bony abnormality. IMPRESSION: Stable right medial upper lobe density and elevated right hemidiaphragm. No acute findings. Electronically Signed   By: Rolm Baptise M.D.   On: 09/12/2016 13:25   Dg Chest 2 View  Result Date: 09/08/2016 CLINICAL DATA:  Cough and chest congestion for 2-3 days. History of right-sided lung malignancy, COPD, discontinued smoking in 2010. EXAM: CHEST  2 VIEW COMPARISON:  AP chest x-ray and chest CT scan of June 14, 2016 FINDINGS: There is chronic volume loss on the right. A persistent abnormal soft tissue density mass is noted in the right upper lobe. There is pleural thickening in the right apex. No blunting of the costophrenic angles on the right or left is observed. The interstitial markings are mildly prominent in the left lung but stable. There is an area patchy density lateral to the cardiac apex which appears to lie in the lingula. The heart and pulmonary vascularity are normal. There is calcification in the wall of the aortic arch. The bony thorax exhibits no acute abnormality. IMPRESSION: 1. Mild lingular atelectasis or early pneumonia. Stable parenchymal density in the right upper lobe and right paratracheal region. No CHF. Followup PA and lateral chest X-ray is recommended in 3-4 weeks following trial of antibiotic therapy to ensure resolution and exclude underlying malignancy. 2. Thoracic aortic atherosclerosis. Electronically Signed   By: David  Martinique M.D.   On: 09/08/2016 08:15   Ct Chest W Contrast  Result Date: 09/14/2016 CLINICAL DATA:  Diverticulitis post abscess drainage and surgery, complicated by a supraventricular  tachycardia then cardiac arrest post CPR and defibrillation, intubation, hypertensive, history COPD, coronary disease, diabetes mellitus, hypertension, non-small-cell lung cancer EXAM: CT CHEST,  ABDOMEN, AND PELVIS WITH CONTRAST TECHNIQUE: Multidetector CT imaging of the chest, abdomen and pelvis was performed following the standard protocol during bolus administration of intravenous contrast. Sagittal and coronal MPR images reconstructed from axial data set. CONTRAST:  Dilute oral contrast.  100 cc Isovue 300 IV. COMPARISON:  CT chest 06/14/2016, CT abdomen and pelvis 09/11/2016 FINDINGS: CT CHEST FINDINGS Cardiovascular: Atherosclerotic calcifications aorta and coronary arteries. Aorta normal caliber. Thoracic vascular structures grossly patent on nondedicated exam. Small pericardial fusion. Mediastinum/Nodes: Nasogastric tube traverses esophagus into stomach. Tip of endotracheal tube above carina. Small dense calcification within RIGHT thyroid lobe is unchanged. Base of cervical region otherwise unremarkable. Palpable with soft tissue density at the RIGHT hilum and paramediastinal RIGHT upper lobe, patient with known radiation fibrosis. Lungs/Pleura: Small BILATERAL pleural effusions larger on RIGHT. In addition to the previously identified radiation fibrosis changes in the RIGHT upper lobe, additional RIGHT upper lobe consolidation is seen question pneumonia. Compressive atelectasis in the lower lobes. Additional scattered airspace infiltrates primarily in RIGHT lung which could represent infection or less likely asymmetric edema. No pneumothorax. Musculoskeletal: Essentially nondisplaced mid sternal fracture sagittal image 81. Fractures of the anterior RIGHT third and fifth ribs. CT ABDOMEN PELVIS FINDINGS Hepatobiliary: Gallbladder surgically absent. Mild central intrahepatic and extrahepatic biliary dilatation little changed. No focal liver lesions. Pancreas: Normal appearance Spleen: Normal appearance  Adrenals/Urinary Tract: Adrenal glands normal appearance. Foley catheter decompresses urinary bladder. Kidneys in ureters normal appearance. Stomach/Bowel: Diverticulosis changes at the sigmoid colon with decreased surrounding inflammation versus prior study. Abscess collection seen on the previous exam no longer identified, pigtail drainage catheter at site. Normal appendix. Stomach and bowel loops otherwise normal appearance. Vascular/Lymphatic: Atherosclerotic calcification aorta without aneurysm. No significant adenopathy. Reproductive: Uterus surgically absent. Other: Small amounts of free pelvic fluid and perihepatic/perisplenic fluid without free air. Small umbilical hernia containing fat. Musculoskeletal: No acute osseous findings. 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 Additional RIGHT upper lobe and scattered RIGHT lung infiltrates question pneumonia versus less likely asymmetric edema. Small BILATERAL pleural effusions RIGHT greater than LEFT with minimal adjacent compressive atelectasis of the lower lobes. Improved diverticulitis changes with resolution of the previously identified abscess collection adjacent to the sigmoid colon. Fractures of the sternum and the anterior RIGHT third and fifth ribs suspect related to CPR. Small pericardial fusion. Electronically Signed   By: Lavonia Dana M.D.   On: 09/14/2016 13:01   Ct Abdomen Pelvis W Contrast  Result Date: 09/14/2016 CLINICAL DATA:  Diverticulitis post abscess drainage and surgery, complicated by a supraventricular tachycardia then cardiac arrest post CPR and defibrillation, intubation, hypertensive, history COPD, coronary disease, diabetes mellitus, hypertension, non-small-cell lung cancer EXAM: CT CHEST, ABDOMEN, AND PELVIS WITH CONTRAST TECHNIQUE: Multidetector CT imaging of the chest, abdomen and pelvis was performed following the standard protocol during bolus administration of  intravenous contrast. Sagittal and coronal MPR images reconstructed from axial data set. CONTRAST:  Dilute oral contrast.  100 cc Isovue 300 IV. COMPARISON:  CT chest 06/14/2016, CT abdomen and pelvis 09/11/2016 FINDINGS: CT CHEST FINDINGS Cardiovascular: Atherosclerotic calcifications aorta and coronary arteries. Aorta normal caliber. Thoracic vascular structures grossly patent on nondedicated exam. Small pericardial fusion. Mediastinum/Nodes: Nasogastric tube traverses esophagus into stomach. Tip of endotracheal tube above carina. Small dense calcification within RIGHT thyroid lobe is unchanged. Base of cervical region otherwise unremarkable. Palpable with soft tissue density at the RIGHT hilum and paramediastinal RIGHT upper lobe, patient with known radiation fibrosis. Lungs/Pleura: Small BILATERAL  pleural effusions larger on RIGHT. In addition to the previously identified radiation fibrosis changes in the RIGHT upper lobe, additional RIGHT upper lobe consolidation is seen question pneumonia. Compressive atelectasis in the lower lobes. Additional scattered airspace infiltrates primarily in RIGHT lung which could represent infection or less likely asymmetric edema. No pneumothorax. Musculoskeletal: Essentially nondisplaced mid sternal fracture sagittal image 81. Fractures of the anterior RIGHT third and fifth ribs. CT ABDOMEN PELVIS FINDINGS Hepatobiliary: Gallbladder surgically absent. Mild central intrahepatic and extrahepatic biliary dilatation little changed. No focal liver lesions. Pancreas: Normal appearance Spleen: Normal appearance Adrenals/Urinary Tract: Adrenal glands normal appearance. Foley catheter decompresses urinary bladder. Kidneys in ureters normal appearance. Stomach/Bowel: Diverticulosis changes at the sigmoid colon with decreased surrounding inflammation versus prior study. Abscess collection seen on the previous exam no longer identified, pigtail drainage catheter at site. Normal appendix.  Stomach and bowel loops otherwise normal appearance. Vascular/Lymphatic: Atherosclerotic calcification aorta without aneurysm. No significant adenopathy. Reproductive: Uterus surgically absent. Other: Small amounts of free pelvic fluid and perihepatic/perisplenic fluid without free air. Small umbilical hernia containing fat. Musculoskeletal: No acute osseous findings. 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 Additional RIGHT upper lobe and scattered RIGHT lung infiltrates question pneumonia versus less likely asymmetric edema. Small BILATERAL pleural effusions RIGHT greater than LEFT with minimal adjacent compressive atelectasis of the lower lobes. Improved diverticulitis changes with resolution of the previously identified abscess collection adjacent to the sigmoid colon. Fractures of the sternum and the anterior RIGHT third and fifth ribs suspect related to CPR. Small pericardial fusion. Electronically Signed   By: Lavonia Dana M.D.   On: 09/14/2016 13:01   Ct Abdomen Pelvis W Contrast  Result Date: 09/11/2016 CLINICAL DATA:  70 year old female with abdominal, pelvic and flank pain for 1 day. History of lung cancer, appendectomy, cholecystectomy and hysterectomy. EXAM: CT ABDOMEN AND PELVIS WITH CONTRAST TECHNIQUE: Multidetector CT imaging of the abdomen and pelvis was performed using the standard protocol following bolus administration of intravenous contrast. CONTRAST:  141m ISOVUE-300 IOPAMIDOL (ISOVUE-300) INJECTION 61% COMPARISON:  06/14/2016 and prior exams. FINDINGS: Lower chest: No acute abnormality. Hepatobiliary: No acute or suspicious hepatic abnormality. The patient is status post cholecystectomy. Pancreas: Unremarkable Spleen: Unremarkable Adrenals/Urinary Tract: The kidneys, adrenal glands and bladder are unremarkable. Stomach/Bowel: Focal diverticulitis of the mid sigmoid colon is noted. A 2.5 x 4.1 cm abscess along the medial  aspect of the mid sigmoid colon is noted (image 73). Adjacent foci of free air and along the left pelvic side wall are noted. A small amount of free complex fluid within the pelvis is present. There is no evidence of bowel obstruction. No other focal areas of bowel wall thickening are present. The appendix is normal. Vascular/Lymphatic: Abdominal aortic atherosclerotic calcifications noted without aneurysm. No enlarged lymph nodes identified. Reproductive: Status post hysterectomy. No adnexal masses. Other: None Musculoskeletal: No acute or suspicious abnormalities. IMPRESSION: Focal diverticulitis of the mid sigmoid colon with adjacent 2.5 x 4.1 cm abscess and foci of pneumoperitoneum in this area. Small amount of complex free fluid within the pelvis. Abdominal aortic atherosclerosis. Electronically Signed   By: JMargarette CanadaM.D.   On: 09/11/2016 13:38   Dg Chest Port 1 View  Result Date: 09/14/2016 CLINICAL DATA:  Central line placement EXAM: PORTABLE CHEST 1 VIEW COMPARISON:  09/14/2016 FINDINGS: Cardiomediastinal silhouette is stable. Stable endotracheal tube position. NG tube in place. There is persistent consolidation in right upper lobe. Left IJ central line with tip in  distal SVC. No pneumothorax. IMPRESSION: Stable support apparatus. Left IJ central line in place. No pneumothorax. Electronically Signed   By: [name] M.D.   On: 09/14/2016 09:09   Dg Chest Port 1 View  Result Date: 09/14/2016 CLINICAL DATA:  Hypoxia EXAM: PORTABLE CHEST 1 VIEW COMPARISON:  September 12, 2016 chest radiograph and chest CT June 14, 2016 FINDINGS: Endotracheal tube tip is 3.1 cm above the carina. No pneumothorax. There is persistent airspace consolidation throughout much of the right upper lobe. There is suspected residual mass in this area medially. The lungs elsewhere are clear. Heart size and pulmonary vascularity are normal. No adenopathy. There is atherosclerotic calcification in the aorta. No bone lesions.  Incidental note is made of small cervical ribs bilaterally. IMPRESSION: Endotracheal tube as described without pneumothorax. There is airspace consolidation throughout much the right upper lobe. There is a nodular area in the medial aspect of the right upper lobe measuring 2.3 x 2.0 cm, concerning for residual mass in this area. This finding may warrant correlation with contrast enhanced chest CT to further assess. 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 Lungs elsewhere clear. Stable cardiac silhouette. There is aortic atherosclerosis. Electronically Signed   By: Lowella Grip III M.D.   On: 09/14/2016 08:06   Ct Image Guided Fluid Drain By Catheter  Result Date: 09/12/2016 CLINICAL DATA:  Sigmoid diverticulitis with focal diverticular abscess. EXAM: CT GUIDED CATHETER DRAINAGE OF PERITONEAL ABSCESS ANESTHESIA/SEDATION: 1.5 mg IV Versed 50 mcg IV Fentanyl Total Moderate Sedation Time:  30 minutes The patient's level of consciousness and physiologic status were continuously monitored during the procedure by Radiology nursing. PROCEDURE: The procedure, risks, benefits, and alternatives were explained to the patient. Questions regarding the procedure were encouraged and answered. The patient understands and consents to the procedure. A time out was performed prior to initiating the procedure. The anterior abdominal wall was prepped with chlorhexidine in a sterile fashion, and a sterile drape was applied covering the operative field. A sterile gown and sterile gloves were used for the procedure. Local anesthesia was provided with 1% Lidocaine. CT was performed in a supine position through the pelvis. Under CT guidance, an 18 gauge trocar needle was advanced to the level of a sigmoid diverticular abscess. After advancing a needle into the abscess, a guidewire was advanced. The tract was dilated and a 10 French percutaneous drainage catheter advanced  over the wire. Catheter position was confirmed by CT. A fluid sample was aspirated and sent for culture analysis. The catheter was connected to suction bulb drainage. It was secured at the skin with a Prolene retention suture and StatLock device. COMPLICATIONS: None FINDINGS: Anterior percutaneous window was present to the level of the diverticular abscess located medial to the sigmoid colon. Aspiration yielded bloody and purulent fluid. The drainage catheter was able to be formed in the abscess. IMPRESSION: CT-guided percutaneous drainage of diverticular peritoneal abscess located adjacent to the sigmoid colon. A sample of purulent fluid was sent for culture analysis. Electronically Signed   By: Aletta Edouard M.D.   On: 09/12/2016 16:45    EKG & Cardiac Imaging    EKG: Sinus rhythm with PAC and PVC, low voltage, no acute ST changes. Nonspecific IVCD new since last tracing  LHC Report 05/2012 Hemodynamic Findings: Central aortic pressure: 118/56 Left ventricular pressure: 131/5/12  Angiographic Findings:  Left main: No obstructive disease.   Left Anterior Descending Artery:  Moderate sized vessel  with luminal irregularities in the mid segment. There are two diagonal branches. The first diagonal branch is moderate sized with mild plaque at the ostium. The second diagonal branch is very small. The LAD quickly tapers to a small caliber vessel beyond the second diagonal branch and wraps around the apex.   Circumflex Artery: Moderate sized vessel with no obstructive disease noted. The first marginal is very small. The second marginal branch is moderate sized with no disease. The distal AV groove Circumflex is small with no disease noted.   Right Coronary Artery: Moderate sized vessel with 30% proximal stenosis, 30% tubular mid to distal stenosis.   Left Ventricular Angiogram: LVEF=65%. No WMA. No MR  Impression: 1. Mild, non-obstructive CAD 2. Normal LV systolic function 3. Non-cardiac  chest pain  Recommendations: Medical management including ASA and statin. Should be ok to discharge today after bedrest. [name] can f/u with Dr. Lia Foyer in our [facility] office in several weeks.   2D Echocardiogram 11/22:  Study Conclusions  - Left ventricle: The cavity size was normal. Wall thickness was   normal. Systolic function was moderately to severely reduced. The   estimated ejection fraction was in the range of 30% to 35%.   Akinesis of the mid-apicalanteroseptal myocardium. Doppler   parameters are consistent with abnormal left ventricular   relaxation (grade 1 diastolic dysfunction). - Aortic valve: Mildly calcified annulus. There was trivial   regurgitation. - Pulmonary arteries: Systolic pressure was mildly increased. PA   peak pressure: 38 mm Hg (S). - Pericardium, extracardiac: A trivial pericardial effusion was   identified.  Impressions:  - Technically difficult images   The LV function is moderately reduced with EF of 30-35%.   There is mid - distal anteroseptal akinesis  Assessment & Plan    Active Problems:   Torsades de pointes (HCC)   Cardiac arrest with ventricular fibrillation (HCC)   Congestive dilated cardiomyopathy (HCC) - New   Small cell lung cancer ([place])   DM (diabetes mellitus), type 2, uncontrolled with complications (HCC)   Hyperlipidemia, mixed   GERD   COPD (chronic obstructive pulmonary disease) (HCC)   Non-occlusive coronary artery disease   Diverticulitis of large intestine with abscess   Diverticulitis large intestine   Abdominal abscess (HCC)   Colonic diverticular abscess    Cardiac Arrest with a history of non-obstructive CAD by cath in 2013 (no OP follow up): - Cardiomyopathy with regional WMA on Echo post arrest. Review of tele shows that [name] initially went into Torsades de pointes, quickly went into V.Fib to V.Tach.  Had ROSC in 6-8 minutes. Curiously Mag was 2.1 following the code.  Initial trop 0.12. EKG with no  acute ST changes. Nonspecific IVCD new since last tracing. ECHO with EF 30-35% and mid-distal anteroseptal akinesis. No RV strain noted. Previously no know history of LV dysfunction.  [name] history of CAD with multiple risk factors, V.Fib and focal akinesis are concerning for possible ACS. EKG with no acute changes.  Complicated by hypotension, ? Cardiogenic vs septic shock. Currently on phenylephrine and maintaining good MAPs. With [name] reduced EF would attempt to wean off the phenylephrine and reduce the afterload pressure.  Continue to trend trops Clarify with surgery about heparin gtt Will need to go for PCI.   Sigmoid Diverticulitis On vanc/zosyn and anidulafungin Going for CT Abd/Pelvis now  Hypokalmeia K 3.3 Replace per PCCM  DM2 Last A1c 7.1% 05/2016.  Per PCCM.   HLD On Lipitor 80 mg daily as outpatient  Signed, Glenetta Hew, MD PGY2 09/14/2016, 1:27 PM    I have seen, examined and evaluated the patient this PM along with Dr. Charlynn Grimes, [place].  After reviewing all the available data and chart, we discussed the patients laboratory, study & physical findings as well as symptoms in detail. I agree with his findings, examination as well as impression recommendations as per our discussion.    Pt with h/o non-obstructive CAD & normal Echo (formerly followed by Dr. Lia Foyer) who was admitted on 11/19 with recurrent diverticulitis with pneumoperitoneum/abscess treated with a Perc Drain.  Has had issues with pain control leading up to this AM when [name] went into Torsades (~652 AM) followed by VF-VT with Code Blue called.  Defib x 2 to ROSC.  Now awake & responsive.   No c/o CP or palpitations.  Echo now with Anteroapical/septal HK with significantly reduced LVEF. BP initially low - on Neo & improved.  CT Abdomen just done shows improved overall intraperitoneal process.  D/w Gen Sgx & Dr. Kathlene Cote from IR - both are ok with anticoagulation.  With PMT/Torsades -VF & VT & now anterior WMA, we  need to exclude CAD vs. Takotsubo CM -- recommend LHC-Angio +/- PCI.  I discussed this with the patient (who is on Fentanyl & intermittently responsive) along with [name] husband,son & daughter.    Risks / Complications include, but not limited to: Death, MI, CVA/TIA, VF/VT (with defibrillation), Bradycardia (need for temporary pacer placement), contrast induced nephropathy, bleeding / bruising / hematoma / pseudoaneurysm, vascular or coronary injury (with possible emergent CT or Vascular Surgery), adverse medication reactions, infection.  Additional risks involving the use of radiation with the possibility of radiation burns and cancer were explained in detail.  The family voice understanding and agree to proceed.   More recommendations to follow after we know [name] coronary anatomy. Would hope to wean off Neosynepherine in setting of Cardiomyopathy to avoid XS afterload, but this may be a better option than Levophed which is more arrhythmogenic.      Glenetta Hew, M.D., M.S. Interventional Cardiologist   Pager # [phone] Phone # [phone] [address] [place], [place] [address]

## 2016-09-14 NOTE — Progress Notes (Signed)
  Echocardiogram 2D Echocardiogram has been performed.  Bobbye Charleston 09/14/2016, 9:39 AM

## 2016-09-14 NOTE — Progress Notes (Signed)
I arrive at the room at 7:15 the patient was in SVT at 190 with a low blood pressure and agitated, she received 5 of metoprolol, and 100 and fentanyl her heart rate improved and EKG was done that showed low voltage with a sinus rhythm multiple premature atrial complexes. Her blood pressure was 140/90 and she was transferred to 4 N. It was communicated gated to critical care. Stat CBC, BMP, ABG troponin magnesium lactic acid were ordered. Chest x-ray was also ordered, I discussed with critical care to obtain a CT scan of the abdomen and pelvis as she had a drain placed on 09/12/2016.

## 2016-09-14 NOTE — Progress Notes (Signed)
Central tele metry tech.called & said pt's.HR is in 190's & went to asystole in heart monitor.Went into pt's room right away & found pt.is nonresponsive ,pale looking & no pulse noted.Started CPR & called  Code blue .

## 2016-09-14 NOTE — Progress Notes (Signed)
Pt.transferred to 4N 31 & report given to [employer]

## 2016-09-14 NOTE — Progress Notes (Signed)
Pharmacy Antibiotic Note  [name] is a 70 y.o. female admitted on 09/11/2016 with diverticulitis, now post code w/ concern for infection.  Pharmacy has been consulted for vancomycin dosing.  Plan: Vancomycin '1000mg'$  IV every 12 hours.  Goal trough 15-20 mcg/mL.  Height: '5\' 2"'$  (157.5 cm) Weight: 183 lb 8 oz (83.2 kg) IBW/kg (Calculated) : 50.1  Temp (24hrs), Avg:97.6 F (36.4 C), Min:97.5 F (36.4 C), Max:97.7 F (36.5 C)   Recent Labs Lab 09/11/16 1030 09/11/16 1045 09/12/16 0422 09/13/16 0513  WBC 15.4*  --  11.0*  --   CREATININE 0.92  --  0.70 0.66  LATICACIDVEN  --  1.71  --   --     Estimated Creatinine Clearance: 67.3 mL/min (by C-G formula based on SCr of 0.66 mg/dL).    Allergies  Allergen Reactions  . Lactose Intolerance (Gi) Diarrhea  . Ibuprofen     REACTION: Anxiousness, hyperventilates     Thank you for allowing pharmacy to be a part of this patient's care.  Wynona Neat, PharmD, BCPS  09/14/2016 7:34 AM

## 2016-09-14 NOTE — Progress Notes (Signed)
Initial Nutrition Assessment  DOCUMENTATION CODES:   Obesity unspecified  INTERVENTION:   If pt remains on ventilator support recommend Vital High Protein @ 25 ml/hr (600 ml/day) 60 ml Prostat BID Provides: 1000 kcal, 112 grams protein, and 501 ml H2O.    NUTRITION DIAGNOSIS:   Inadequate oral intake related to altered GI function as evidenced by NPO status.  GOAL:   Provide needs based on ASPEN/SCCM guidelines  MONITOR:   I & O's, Vent status  REASON FOR ASSESSMENT:   Ventilator    ASSESSMENT:   70 year old female with a past medical history significant for nonobstructive CAD (cath 2006 and again in 2013 with normal LV function, mild, non-obstructive CAD noted), HTN, DM 2, HLD, COPD, former smoker, Hx of non-small cell lung cater s/p chemo and radiation, hx of diverticulitis who initially presented on 11/19 with LLQ pain and found to have diverticulitis with abscess formation. Drain placed. 11/22 am when into cardiac arrest. Intubated and transferred to the ICU.   Admission weight 77.1 kg Family at bedside and report tolerance of liquid diet before arrest.   Patient is currently intubated on ventilator support Temp (24hrs), Avg:97.9 F (36.6 C), Min:97.5 F (36.4 C), Max:98.4 F (36.9 C)  Medications reviewed   CBG: 131-204 Labs reviewed: K+ 3.3 Nutrition-Focused physical exam completed. Findings are no fat depletion, no muscle depletion, and no edema.     Diet Order:  Diet NPO time specified  Skin:  Reviewed, no issues  Last BM:  11/21  Height:   Ht Readings from Last 1 Encounters:  09/14/16 '5\' 2"'$  (1.575 m)    Weight:   Wt Readings from Last 1 Encounters:  09/14/16 183 lb 8 oz (83.2 kg)    Ideal Body Weight:  50 kg  BMI:  Body mass index is 33.56 kg/m.  Estimated Nutritional Needs:   Kcal:  400-8676  Protein:  >110 grams   Fluid:  > 1.5 L/day  EDUCATION NEEDS:   No education needs identified at this time  [place], [employer],  [place] Pager [phone] After Hours Pager

## 2016-09-14 NOTE — H&P (View-Only) (Signed)
Cardiology Consult    Patient ID: [name] MRN: [id number], DOB/AGE: April 01, 1946   Admit date: 09/11/2016 Date of Consult: 09/14/2016  Primary Physician: [name], MD Reason for Consult: Cardiac arrest with ROSC after 6 minutes Primary Cardiologist: Previously Dr. Lia Foyer - never seen in office Requesting Provider: Dr. Titus Mould  Patient Profile    [name] is a 70 year old female with a past medical history significant for nonobstructive CAD (cath 2006 and again in 2013 with normal LV function, mild, non-obstructive CAD noted), HTN, DM 2, HLD, COPD, former smoker, Hx of non-small cell lung cater s/p chemo and radiation, hx of diverticulitis who initially presented on 11/19 with LLQ pain and found to have diverticulitis with abscess formation. Drain placed. 11/22 am when into cardiac arrest. Intubated and transferred to the ICU.   History of Present Illness    [name] initially presented on 11/19 with complaints of severe LLQ pain. Found to have diverticulitis in the mid sigmoid colon with abscess formation and a small fossae of pneumoperitoneum in the area. She was started on Zosyn at that time. Underwent IR guided drainage with drain placed on 11/20. She had worsening abdominal pain, uncontrolled on her pain medications on 11/21. On 11/22 am, she went into SVT with HR in the 190s and associated hypotension. Subsequently went into cardiac arrest with 6 minutes of CPR before attaining ROSC. Reportedly went into PEA arrest with subsequent V. Fib. Debrillation x 2 for V.Fib. She was intubated during the code. Awake post arrest but remained hypotensive; started on phenylephrine gtt. Transferred to the ICU.   She is awake and able to follow commands. Denies any chest pain prior to the episode. Now having sharp stabbing pain in her left chest. Non-radiating. Denies any preceding symptoms. No sob, nausea, palpations or diaphoresis.   Past Medical History   Past  Medical History:  Diagnosis Date  . Age-related nuclear cataract of both eyes 2016   +cortical age related cataracts OU   . Allergy    seasonal  . Arthritis    hands  . Bilateral diabetic retinopathy (HCC) 2015   Dr. Zigmund Daniel  . Blood transfusion without reported diagnosis    when taking chemo  . CAD (coronary artery disease)    NON obstructive. Cath 2006 preserved LV fxn, scattered irregularities without critical stenosis. 2008 stress echo negative for ischemia, but with hypertensive response  . COPD (chronic obstructive pulmonary disease) ([place])   . Diabetes mellitus with complication (HCC)    diab retinopathy OU (laser)  . Diverticulitis 05/2016  . GERD (gastroesophageal reflux disease)    protonix  . History of diverticulitis of colon   . Hyperlipidemia   . Hypertension   . Lung cancer ([place])    non-small cell lung ca, stage III in 05/2011; systemic chemotherapy concurrent with radiation followed by prophylactic cranial irradiation and has been observation since July of 2010 with no evidence for disease recurrence-released from onc f/u 12/2014 (needs annual cxr by PCP).  CXR 08/2015 stable.  . Mild cognitive impairment with memory loss    Likely from brain radiation therapy  . Open-angle glaucoma 2016   Dr. Shirley Muscat, (bilateral)---responding to topical therapy  . Osteopenia 03/2016   03/2016 DEXA T-score -2.2    Past Surgical History:  Procedure Laterality Date  . ABDOMINAL HYSTERECTOMY  1997  . APPENDECTOMY    . CARPAL TUNNEL RELEASE    . CATARACT EXTRACTION    . CHOLECYSTECTOMY  2002  .  COLONOSCOPY  10/24/00   normal.  [employer] hemoccult testing via [place] 06/18/15 was NEG  . LEFT HEART CATHETERIZATION WITH CORONARY ANGIOGRAM N/A 05/30/2012   Procedure: LEFT HEART CATHETERIZATION WITH CORONARY ANGIOGRAM;  Surgeon: Burnell Blanks, MD;  Location: [facility] CATH LAB;  Service: Cardiovascular;  Laterality: N/A;     Allergies  Allergies  Allergen Reactions  . Lactose  Intolerance (Gi) Diarrhea  . Ibuprofen     REACTION: Anxiousness, hyperventilates    Inpatient Medications    . [START ON 09/15/2016] anidulafungin  100 mg Intravenous Q24H  . anidulafungin  200 mg Intravenous Once  . aspirin EC  81 mg Oral QHS  . etomidate      . famotidine (PEPCID) IV  20 mg Intravenous Q24H  . fentaNYL (SUBLIMAZE) injection  50 mcg Intravenous Once  . heparin subcutaneous  5,000 Units Subcutaneous Q8H  . hydrocortisone sodium succinate  50 mg Intravenous Q6H  . insulin aspart  0-20 Units Subcutaneous Q4H  . ketorolac  1 drop Left Eye QID  . piperacillin-tazobactam (ZOSYN)  IV  3.375 g Intravenous Q8H  . potassium chloride (KCL MULTIRUN) 30 mEq in 265 mL IVPB  30 mEq Intravenous Once  . sodium chloride flush  3 mL Intravenous Q12H  . vancomycin  1,000 mg Intravenous Q12H    Family History    Family History  Problem Relation Age of Onset  . Heart failure Mother   . Kidney disease Mother     renal failure  . Diabetes Mother   . Diabetes Father   . Diabetes Brother   . Heart disease Brother   . Heart attack Brother   . Heart attack Brother   . Heart attack Brother   . Colon cancer Neg Hx     Social History    Social History   Social History  . Marital status: Married    Spouse name: N/A  . Number of children: N/A  . Years of education: N/A   Occupational History  . Retired Systems analyst Unemployed   Social History Main Topics  . Smoking status: Former Smoker    Packs/day: 1.00    Years: 45.00    Types: Cigarettes    Quit date: 10/24/2008  . Smokeless tobacco: Never Used  . Alcohol use No  . Drug use: No  . Sexual activity: Not on file   Other Topics Concern  . Not on file   Social History Narrative   Lives in [place], has husband and daughter and son. Daughter's family is living with her and husband at present. She is ambulatory daily without cane or walker.      Review of Systems    General:  No chills, fever, night sweats  or weight changes.  Cardiovascular:  +chest pain, no dyspnea on exertion, edema, orthopnea, palpitations, paroxysmal nocturnal dyspnea. Dermatological: No rash, lesions/masses Respiratory: No cough, dyspnea Urologic: No hematuria, dysuria Abdominal:   No nausea, vomiting, diarrhea, bright red blood per rectum, melena, or hematemesis Neurologic:  No visual changes, wkns, changes in mental status. All other systems reviewed and are otherwise negative except as noted above.  Physical Exam    Blood pressure (!) 119/55, pulse 80, temperature 98.4 F (36.9 C), temperature source Oral, resp. rate (!) 23, height '5\' 2"'$  (1.575 m), weight 83.2 kg (183 lb 8 oz), SpO2 100 %.  General: chronically ill appearing female, intubated, NAD Psych: Normal affect. Neuro: Following commands, nods yes or no appropriately. Moves all extremities spontaneously. HEENT: Normal  Neck: Supple without bruits or JVD. Lungs:  Resp regular and unlabored on vent, coarse breath sounds throughout Heart: RRR no s3, s4, or murmurs. Abdomen: Soft, non-tender, non-distended, BS + x 4.  Extremities: No clubbing, cyanosis or edema. DP/PT/Radials 2+ and equal bilaterally.  Labs     Recent Labs  09/14/16 0716  TROPONINI 0.12*   Lab Results  Component Value Date   WBC 14.3 (H) 09/14/2016   HGB 11.2 (L) 09/14/2016   HCT 34.9 (L) 09/14/2016   MCV 92.1 09/14/2016   PLT 246 09/14/2016    Recent Labs Lab 09/12/16 0422  09/14/16 0716  NA 139  < > 143  K 3.2*  < > 3.3*  CL 111  < > 111  CO2 23  < > 19*  BUN 10  < > 8  CREATININE 0.70  < > 1.00  CALCIUM 7.9*  < > 9.4  PROT 5.2*  --   --   BILITOT 0.7  --   --   ALKPHOS 67  --   --   ALT 13*  --   --   AST 14*  --   --   GLUCOSE 112*  < > 228*  < > = values in this interval not displayed. Lab Results  Component Value Date   CHOL 213 (H) 07/17/2015   HDL 32.50 (L) 07/17/2015   LDLCALC 156 (H) 03/03/2015   TRIG 212.0 (H) 07/17/2015   Lab Results  Component  Value Date   DDIMER 1.09 (H) 09/14/2015     Radiology Studies    X-ray Chest Pa And Lateral  Result Date: 09/12/2016 CLINICAL DATA:  Preop EXAM: CHEST  2 VIEW COMPARISON:  09/07/2016 FINDINGS: Elevated right hemidiaphragm. Right medial upper lobe density again noted, unchanged. Left lung is clear. Heart is normal size. No acute bony abnormality. IMPRESSION: Stable right medial upper lobe density and elevated right hemidiaphragm. No acute findings. Electronically Signed   By: Rolm Baptise M.D.   On: 09/12/2016 13:25   Dg Chest 2 View  Result Date: 09/08/2016 CLINICAL DATA:  Cough and chest congestion for 2-3 days. History of right-sided lung malignancy, COPD, discontinued smoking in 2010. EXAM: CHEST  2 VIEW COMPARISON:  AP chest x-ray and chest CT scan of June 14, 2016 FINDINGS: There is chronic volume loss on the right. A persistent abnormal soft tissue density mass is noted in the right upper lobe. There is pleural thickening in the right apex. No blunting of the costophrenic angles on the right or left is observed. The interstitial markings are mildly prominent in the left lung but stable. There is an area patchy density lateral to the cardiac apex which appears to lie in the lingula. The heart and pulmonary vascularity are normal. There is calcification in the wall of the aortic arch. The bony thorax exhibits no acute abnormality. IMPRESSION: 1. Mild lingular atelectasis or early pneumonia. Stable parenchymal density in the right upper lobe and right paratracheal region. No CHF. Followup PA and lateral chest X-ray is recommended in 3-4 weeks following trial of antibiotic therapy to ensure resolution and exclude underlying malignancy. 2. Thoracic aortic atherosclerosis. Electronically Signed   By: Floyed Masoud  [place] M.D.   On: 09/08/2016 08:15   Ct Chest W Contrast  Result Date: 09/14/2016 CLINICAL DATA:  Diverticulitis post abscess drainage and surgery, complicated by a supraventricular  tachycardia then cardiac arrest post CPR and defibrillation, intubation, hypertensive, history COPD, coronary disease, diabetes mellitus, hypertension, non-small-cell lung cancer EXAM: CT CHEST,  ABDOMEN, AND PELVIS WITH CONTRAST TECHNIQUE: Multidetector CT imaging of the chest, abdomen and pelvis was performed following the standard protocol during bolus administration of intravenous contrast. Sagittal and coronal MPR images reconstructed from axial data set. CONTRAST:  Dilute oral contrast.  100 cc Isovue 300 IV. COMPARISON:  CT chest 06/14/2016, CT abdomen and pelvis 09/11/2016 FINDINGS: CT CHEST FINDINGS Cardiovascular: Atherosclerotic calcifications aorta and coronary arteries. Aorta normal caliber. Thoracic vascular structures grossly patent on nondedicated exam. Small pericardial fusion. Mediastinum/Nodes: Nasogastric tube traverses esophagus into stomach. Tip of endotracheal tube above carina. Small dense calcification within RIGHT thyroid lobe is unchanged. Base of cervical region otherwise unremarkable. Palpable with soft tissue density at the RIGHT hilum and paramediastinal RIGHT upper lobe, patient with known radiation fibrosis. Lungs/Pleura: Small BILATERAL pleural effusions larger on RIGHT. In addition to the previously identified radiation fibrosis changes in the RIGHT upper lobe, additional RIGHT upper lobe consolidation is seen question pneumonia. Compressive atelectasis in the lower lobes. Additional scattered airspace infiltrates primarily in RIGHT lung which could represent infection or less likely asymmetric edema. No pneumothorax. Musculoskeletal: Essentially nondisplaced mid sternal fracture sagittal image 81. Fractures of the anterior RIGHT third and fifth ribs. CT ABDOMEN PELVIS FINDINGS Hepatobiliary: Gallbladder surgically absent. Mild central intrahepatic and extrahepatic biliary dilatation little changed. No focal liver lesions. Pancreas: Normal appearance Spleen: Normal appearance  Adrenals/Urinary Tract: Adrenal glands normal appearance. Foley catheter decompresses urinary bladder. Kidneys in ureters normal appearance. Stomach/Bowel: Diverticulosis changes at the sigmoid colon with decreased surrounding inflammation versus prior study. Abscess collection seen on the previous exam no longer identified, pigtail drainage catheter at site. Normal appendix. Stomach and bowel loops otherwise normal appearance. Vascular/Lymphatic: Atherosclerotic calcification aorta without aneurysm. No significant adenopathy. Reproductive: Uterus surgically absent. Other: Small amounts of free pelvic fluid and perihepatic/perisplenic fluid without free air. Small umbilical hernia containing fat. Musculoskeletal: No acute osseous findings. 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 Additional RIGHT upper lobe and scattered RIGHT lung infiltrates question pneumonia versus less likely asymmetric edema. Small BILATERAL pleural effusions RIGHT greater than LEFT with minimal adjacent compressive atelectasis of the lower lobes. Improved diverticulitis changes with resolution of the previously identified abscess collection adjacent to the sigmoid colon. Fractures of the sternum and the anterior RIGHT third and fifth ribs suspect related to CPR. Small pericardial fusion. Electronically Signed   By: Lavonia Dana M.D.   On: 09/14/2016 13:01   Ct Abdomen Pelvis W Contrast  Result Date: 09/14/2016 CLINICAL DATA:  Diverticulitis post abscess drainage and surgery, complicated by a supraventricular tachycardia then cardiac arrest post CPR and defibrillation, intubation, hypertensive, history COPD, coronary disease, diabetes mellitus, hypertension, non-small-cell lung cancer EXAM: CT CHEST, ABDOMEN, AND PELVIS WITH CONTRAST TECHNIQUE: Multidetector CT imaging of the chest, abdomen and pelvis was performed following the standard protocol during bolus administration of  intravenous contrast. Sagittal and coronal MPR images reconstructed from axial data set. CONTRAST:  Dilute oral contrast.  100 cc Isovue 300 IV. COMPARISON:  CT chest 06/14/2016, CT abdomen and pelvis 09/11/2016 FINDINGS: CT CHEST FINDINGS Cardiovascular: Atherosclerotic calcifications aorta and coronary arteries. Aorta normal caliber. Thoracic vascular structures grossly patent on nondedicated exam. Small pericardial fusion. Mediastinum/Nodes: Nasogastric tube traverses esophagus into stomach. Tip of endotracheal tube above carina. Small dense calcification within RIGHT thyroid lobe is unchanged. Base of cervical region otherwise unremarkable. Palpable with soft tissue density at the RIGHT hilum and paramediastinal RIGHT upper lobe, patient with known radiation fibrosis. Lungs/Pleura: Small BILATERAL  pleural effusions larger on RIGHT. In addition to the previously identified radiation fibrosis changes in the RIGHT upper lobe, additional RIGHT upper lobe consolidation is seen question pneumonia. Compressive atelectasis in the lower lobes. Additional scattered airspace infiltrates primarily in RIGHT lung which could represent infection or less likely asymmetric edema. No pneumothorax. Musculoskeletal: Essentially nondisplaced mid sternal fracture sagittal image 81. Fractures of the anterior RIGHT third and fifth ribs. CT ABDOMEN PELVIS FINDINGS Hepatobiliary: Gallbladder surgically absent. Mild central intrahepatic and extrahepatic biliary dilatation little changed. No focal liver lesions. Pancreas: Normal appearance Spleen: Normal appearance Adrenals/Urinary Tract: Adrenal glands normal appearance. Foley catheter decompresses urinary bladder. Kidneys in ureters normal appearance. Stomach/Bowel: Diverticulosis changes at the sigmoid colon with decreased surrounding inflammation versus prior study. Abscess collection seen on the previous exam no longer identified, pigtail drainage catheter at site. Normal appendix.  Stomach and bowel loops otherwise normal appearance. Vascular/Lymphatic: Atherosclerotic calcification aorta without aneurysm. No significant adenopathy. Reproductive: Uterus surgically absent. Other: Small amounts of free pelvic fluid and perihepatic/perisplenic fluid without free air. Small umbilical hernia containing fat. Musculoskeletal: No acute osseous findings. 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 Additional RIGHT upper lobe and scattered RIGHT lung infiltrates question pneumonia versus less likely asymmetric edema. Small BILATERAL pleural effusions RIGHT greater than LEFT with minimal adjacent compressive atelectasis of the lower lobes. Improved diverticulitis changes with resolution of the previously identified abscess collection adjacent to the sigmoid colon. Fractures of the sternum and the anterior RIGHT third and fifth ribs suspect related to CPR. Small pericardial fusion. Electronically Signed   By: Lavonia Dana M.D.   On: 09/14/2016 13:01   Ct Abdomen Pelvis W Contrast  Result Date: 09/11/2016 CLINICAL DATA:  70 year old female with abdominal, pelvic and flank pain for 1 day. History of lung cancer, appendectomy, cholecystectomy and hysterectomy. EXAM: CT ABDOMEN AND PELVIS WITH CONTRAST TECHNIQUE: Multidetector CT imaging of the abdomen and pelvis was performed using the standard protocol following bolus administration of intravenous contrast. CONTRAST:  118m ISOVUE-300 IOPAMIDOL (ISOVUE-300) INJECTION 61% COMPARISON:  06/14/2016 and prior exams. FINDINGS: Lower chest: No acute abnormality. Hepatobiliary: No acute or suspicious hepatic abnormality. The patient is status post cholecystectomy. Pancreas: Unremarkable Spleen: Unremarkable Adrenals/Urinary Tract: The kidneys, adrenal glands and bladder are unremarkable. Stomach/Bowel: Focal diverticulitis of the mid sigmoid colon is noted. A 2.5 x 4.1 cm abscess along the medial  aspect of the mid sigmoid colon is noted (image 73). Adjacent foci of free air and along the left pelvic side wall are noted. A small amount of free complex fluid within the pelvis is present. There is no evidence of bowel obstruction. No other focal areas of bowel wall thickening are present. The appendix is normal. Vascular/Lymphatic: Abdominal aortic atherosclerotic calcifications noted without aneurysm. No enlarged lymph nodes identified. Reproductive: Status post hysterectomy. No adnexal masses. Other: None Musculoskeletal: No acute or suspicious abnormalities. IMPRESSION: Focal diverticulitis of the mid sigmoid colon with adjacent 2.5 x 4.1 cm abscess and foci of pneumoperitoneum in this area. Small amount of complex free fluid within the pelvis. Abdominal aortic atherosclerosis. Electronically Signed   By: JMargarette CanadaM.D.   On: 09/11/2016 13:38   Dg Chest Port 1 View  Result Date: 09/14/2016 CLINICAL DATA:  Central line placement EXAM: PORTABLE CHEST 1 VIEW COMPARISON:  09/14/2016 FINDINGS: Cardiomediastinal silhouette is stable. Stable endotracheal tube position. NG tube in place. There is persistent consolidation in right upper lobe. Left IJ central line with tip in  distal SVC. No pneumothorax. IMPRESSION: Stable support apparatus. Left IJ central line in place. No pneumothorax. Electronically Signed   By: [name] M.D.   On: 09/14/2016 09:09   Dg Chest Port 1 View  Result Date: 09/14/2016 CLINICAL DATA:  Hypoxia EXAM: PORTABLE CHEST 1 VIEW COMPARISON:  September 12, 2016 chest radiograph and chest CT June 14, 2016 FINDINGS: Endotracheal tube tip is 3.1 cm above the carina. No pneumothorax. There is persistent airspace consolidation throughout much of the right upper lobe. There is suspected residual mass in this area medially. The lungs elsewhere are clear. Heart size and pulmonary vascularity are normal. No adenopathy. There is atherosclerotic calcification in the aorta. No bone lesions.  Incidental note is made of small cervical ribs bilaterally. IMPRESSION: Endotracheal tube as described without pneumothorax. There is airspace consolidation throughout much the right upper lobe. There is a nodular area in the medial aspect of the right upper lobe measuring 2.3 x 2.0 cm, concerning for residual mass in this area. This finding may warrant correlation with contrast enhanced chest CT to further assess. 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 Lungs elsewhere clear. Stable cardiac silhouette. There is aortic atherosclerosis. Electronically Signed   By: Lowella Grip III M.D.   On: 09/14/2016 08:06   Ct Image Guided Fluid Drain By Catheter  Result Date: 09/12/2016 CLINICAL DATA:  Sigmoid diverticulitis with focal diverticular abscess. EXAM: CT GUIDED CATHETER DRAINAGE OF PERITONEAL ABSCESS ANESTHESIA/SEDATION: 1.5 mg IV Versed 50 mcg IV Fentanyl Total Moderate Sedation Time:  30 minutes The patient's level of consciousness and physiologic status were continuously monitored during the procedure by Radiology nursing. PROCEDURE: The procedure, risks, benefits, and alternatives were explained to the patient. Questions regarding the procedure were encouraged and answered. The patient understands and consents to the procedure. A time out was performed prior to initiating the procedure. The anterior abdominal wall was prepped with chlorhexidine in a sterile fashion, and a sterile drape was applied covering the operative field. A sterile gown and sterile gloves were used for the procedure. Local anesthesia was provided with 1% Lidocaine. CT was performed in a supine position through the pelvis. Under CT guidance, an 18 gauge trocar needle was advanced to the level of a sigmoid diverticular abscess. After advancing a needle into the abscess, a guidewire was advanced. The tract was dilated and a 10 French percutaneous drainage catheter advanced  over the wire. Catheter position was confirmed by CT. A fluid sample was aspirated and sent for culture analysis. The catheter was connected to suction bulb drainage. It was secured at the skin with a Prolene retention suture and StatLock device. COMPLICATIONS: None FINDINGS: Anterior percutaneous window was present to the level of the diverticular abscess located medial to the sigmoid colon. Aspiration yielded bloody and purulent fluid. The drainage catheter was able to be formed in the abscess. IMPRESSION: CT-guided percutaneous drainage of diverticular peritoneal abscess located adjacent to the sigmoid colon. A sample of purulent fluid was sent for culture analysis. Electronically Signed   By: Aletta Edouard M.D.   On: 09/12/2016 16:45    EKG & Cardiac Imaging    EKG: Sinus rhythm with PAC and PVC, low voltage, no acute ST changes. Nonspecific IVCD new since last tracing  LHC Report 05/2012 Hemodynamic Findings: Central aortic pressure: 118/56 Left ventricular pressure: 131/5/12  Angiographic Findings:  Left main: No obstructive disease.   Left Anterior Descending Artery:  Moderate sized vessel  with luminal irregularities in the mid segment. There are two diagonal branches. The first diagonal branch is moderate sized with mild plaque at the ostium. The second diagonal branch is very small. The LAD quickly tapers to a small caliber vessel beyond the second diagonal branch and wraps around the apex.   Circumflex Artery: Moderate sized vessel with no obstructive disease noted. The first marginal is very small. The second marginal branch is moderate sized with no disease. The distal AV groove Circumflex is small with no disease noted.   Right Coronary Artery: Moderate sized vessel with 30% proximal stenosis, 30% tubular mid to distal stenosis.   Left Ventricular Angiogram: LVEF=65%. No WMA. No MR  Impression: 1. Mild, non-obstructive CAD 2. Normal LV systolic function 3. Non-cardiac  chest pain  Recommendations: Medical management including ASA and statin. Should be ok to discharge today after bedrest. She can f/u with Dr. Lia Foyer in our [facility] office in several weeks.   2D Echocardiogram 11/22:  Study Conclusions  - Left ventricle: The cavity size was normal. Wall thickness was   normal. Systolic function was moderately to severely reduced. The   estimated ejection fraction was in the range of 30% to 35%.   Akinesis of the mid-apicalanteroseptal myocardium. Doppler   parameters are consistent with abnormal left ventricular   relaxation (grade 1 diastolic dysfunction). - Aortic valve: Mildly calcified annulus. There was trivial   regurgitation. - Pulmonary arteries: Systolic pressure was mildly increased. PA   peak pressure: 38 mm Hg (S). - Pericardium, extracardiac: A trivial pericardial effusion was   identified.  Impressions:  - Technically difficult images   The LV function is moderately reduced with EF of 30-35%.   There is mid - distal anteroseptal akinesis  Assessment & Plan    Active Problems:   Torsades de pointes (HCC)   Cardiac arrest with ventricular fibrillation (HCC)   Congestive dilated cardiomyopathy (HCC) - New   Small cell lung cancer ([place])   DM (diabetes mellitus), type 2, uncontrolled with complications (HCC)   Hyperlipidemia, mixed   GERD   COPD (chronic obstructive pulmonary disease) (HCC)   Non-occlusive coronary artery disease   Diverticulitis of large intestine with abscess   Diverticulitis large intestine   Abdominal abscess (HCC)   Colonic diverticular abscess    Cardiac Arrest with a history of non-obstructive CAD by cath in 2013 (no OP follow up): - Cardiomyopathy with regional WMA on Echo post arrest. Review of tele shows that she initially went into Torsades de pointes, quickly went into V.Fib to V.Tach.  Had ROSC in 6-8 minutes. Curiously Mag was 2.1 following the code.  Initial trop 0.12. EKG with no  acute ST changes. Nonspecific IVCD new since last tracing. ECHO with EF 30-35% and mid-distal anteroseptal akinesis. No RV strain noted. Previously no know history of LV dysfunction.  Her history of CAD with multiple risk factors, V.Fib and focal akinesis are concerning for possible ACS. EKG with no acute changes.  Complicated by hypotension, ? Cardiogenic vs septic shock. Currently on phenylephrine and maintaining good MAPs. With her reduced EF would attempt to wean off the phenylephrine and reduce the afterload pressure.  Continue to trend trops Clarify with surgery about heparin gtt Will need to go for PCI.   Sigmoid Diverticulitis On vanc/zosyn and anidulafungin Going for CT Abd/Pelvis now  Hypokalmeia K 3.3 Replace per PCCM  DM2 Last A1c 7.1% 05/2016.  Per PCCM.   HLD On Lipitor 80 mg daily as outpatient  Signed, Glenetta Hew, MD PGY2 09/14/2016, 1:27 PM    I have seen, examined and evaluated the patient this PM along with Dr. Charlynn Grimes, [place].  After reviewing all the available data and chart, we discussed the patients laboratory, study & physical findings as well as symptoms in detail. I agree with his findings, examination as well as impression recommendations as per our discussion.    Pt with h/o non-obstructive CAD & normal Echo (formerly followed by Dr. Lia Foyer) who was admitted on 11/19 with recurrent diverticulitis with pneumoperitoneum/abscess treated with a Perc Drain.  Has had issues with pain control leading up to this AM when she went into Torsades (~652 AM) followed by VF-VT with Code Blue called.  Defib x 2 to ROSC.  Now awake & responsive.   No c/o CP or palpitations.  Echo now with Anteroapical/septal HK with significantly reduced LVEF. BP initially low - on Neo & improved.  CT Abdomen just done shows improved overall intraperitoneal process.  D/w Gen Sgx & Dr. Kathlene Cote from IR - both are ok with anticoagulation.  With PMT/Torsades -VF & VT & now anterior WMA, we  need to exclude CAD vs. Takotsubo CM -- recommend LHC-Angio +/- PCI.  I discussed this with the patient (who is on Fentanyl & intermittently responsive) along with her husband,son & daughter.    Risks / Complications include, but not limited to: Death, MI, CVA/TIA, VF/VT (with defibrillation), Bradycardia (need for temporary pacer placement), contrast induced nephropathy, bleeding / bruising / hematoma / pseudoaneurysm, vascular or coronary injury (with possible emergent CT or Vascular Surgery), adverse medication reactions, infection.  Additional risks involving the use of radiation with the possibility of radiation burns and cancer were explained in detail.  The family voice understanding and agree to proceed.   More recommendations to follow after we know her coronary anatomy. Would hope to wean off Neosynepherine in setting of Cardiomyopathy to avoid XS afterload, but this may be a better option than Levophed which is more arrhythmogenic.      Glenetta Hew, M.D., M.S. Interventional Cardiologist   Pager # [phone] Phone # [phone] [address] [place], [place] [address]

## 2016-09-14 NOTE — Procedures (Signed)
Central Venous Catheter Insertion Procedure Note JOELYS STAUBS [id number] [birth year]  Procedure: Insertion of Central Venous Catheter Indications: Assessment of intravascular volume, Drug and/or fluid administration and Frequent blood sampling  Procedure Details Consent: Risks of procedure as well as the alternatives and risks of each were explained to the (patient/caregiver).  Consent for procedure obtained. Time Out: Verified patient identification, verified procedure, site/side was marked, verified correct patient position, special equipment/implants available, medications/allergies/relevent history reviewed, required imaging and test results available.  Performed  Maximum sterile technique was used including antiseptics, cap, gloves, gown, hand hygiene, mask and sheet. Skin prep: Chlorhexidine; local anesthetic administered A antimicrobial bonded/coated triple lumen catheter was placed in the left internal jugular vein using the Seldinger technique. Ultrasound guidance used.Yes.   Catheter placed to 20 cm. Blood aspirated via all 3 ports and then flushed x 3. Line sutured x 2 and dressing applied.  Evaluation Blood flow good Complications: No apparent complications Patient did tolerate procedure well. Chest X-ray ordered to verify placement.  CXR: pending.  Richardson Landry [place] ACNP Maryanna Shape PCCM Pager [phone] till 3 pm If no answer page [phone] 09/14/2016, 8:23 AM

## 2016-09-14 NOTE — Progress Notes (Signed)
   09/14/16 0900  Clinical Encounter Type  Visited With Family  Visit Type Follow-up  Referral From Chaplain  Consult/Referral To Chaplain  Spiritual Encounters  Spiritual Needs Emotional  Stress Factors  Patient Stress Factors None identified  Family Stress Factors Exhausted    Chaplain referred to pt family on 4N from oc chaplain. Pf family in relatively good spirits but spoke of exhaustion. We spoke of self-care. Invited to chapel at 12 and talked about potentially staying in "shifts"

## 2016-09-14 NOTE — Procedures (Signed)
Central Venous Catheter Insertion Procedure Note NIKI PAYMENT [id number] Feb 16, 1946  Procedure: Insertion of Central Venous Catheter Indications: Drug and/or fluid administration  Procedure Details Consent: Unable to obtain consent because of altered level of consciousness. Time Out: Verified patient identification, verified procedure, site/side was marked, verified correct patient position, special equipment/implants available, medications/allergies/relevent history reviewed, required imaging and test results available.  Performed  Maximum sterile technique was used including antiseptics, cap, gloves, gown, hand hygiene, mask and sheet. Skin prep: Chlorhexidine; local anesthetic administered A antimicrobial bonded/coated triple lumen catheter was placed in the right internal jugular vein using the Seldinger technique.  Evaluation Blood flow good Complications: No apparent complications Patient did tolerate procedure well. Chest X-ray ordered to verify placement.  CXR: pending.  Atalia Litzinger 09/14/2016, 6:34 PM

## 2016-09-14 NOTE — Progress Notes (Signed)
Call from cath lab RN  -> patient left IJ displaced + distal port brokern  Plan  d/w bedside PCCM MD Dr Vaughan Browner - to replace  Dr. Brand Males, M.D., [facility].C.P Pulmonary and Critical Care Medicine Staff Physician [facility] Pager: [phone], If no answer or between  15:00h - 7:00h: call [phone]  09/14/2016 5:24 PM

## 2016-09-14 NOTE — Consult Note (Addendum)
PULMONARY / CRITICAL CARE MEDICINE   Name: [name] MRN: [id number] DOB: [birth year]    ADMISSION DATE:  09/11/2016 CONSULTATION DATE:  11/22  REFERRING MD:  Sheliah Plane ([place])   CHIEF COMPLAINT:  PEA arrest   HISTORY OF PRESENT ILLNESS:   70yo female with hx diverticulitis, CAD, COPD, DM, HTN, hx NSCLC in 2012 initially presented 11/19 with abd pain.  Initial w/u revealed diverticulitis with abscess.  She was seen by surgery, started on zosyn and had IR drain placed 11/20.  She was improving slowly until 11/22 when she was found to be in SVT 190's, then subsequently had PEA arrest with total 6 mins CPR, Defib x 2 for VFib. Intubated during code, awake post arrest but remained hypotensive.  PCCM consulted for ICU tx.   PAST MEDICAL HISTORY :  She  has a past medical history of Age-related nuclear cataract of both eyes (2016); Allergy; Arthritis; Bilateral diabetic retinopathy ([place]) (2015); Blood transfusion without reported diagnosis; CAD (coronary artery disease); COPD (chronic obstructive pulmonary disease) ([place]); Diabetes mellitus with complication ([place]); Diverticulitis (05/2016); GERD (gastroesophageal reflux disease); History of diverticulitis of colon; Hyperlipidemia; Hypertension; Lung cancer ([place]); Mild cognitive impairment with memory loss; Open-angle glaucoma (2016); and Osteopenia (03/2016).  PAST SURGICAL HISTORY: She  has a past surgical history that includes Appendectomy; Abdominal hysterectomy (1997); Carpal tunnel release; left heart catheterization with coronary angiogram (N/A, 05/30/2012); Colonoscopy (10/24/00); Cholecystectomy (2002); and Cataract extraction.  Allergies  Allergen Reactions  . Lactose Intolerance (Gi) Diarrhea  . Ibuprofen     REACTION: Anxiousness, hyperventilates    No current facility-administered medications on file prior to encounter.    Current Outpatient Prescriptions on File Prior to Encounter  Medication Sig  . albuterol (PROVENTIL) (2.5  MG/3ML) 0.083% nebulizer solution USE ONE VIAL IN NEBULIZER EVERY 4 HOURS AS NEEDED FOR WHEEZING OR SHORTNESS OF BREATH  . aspirin-acetaminophen-caffeine (EXCEDRIN MIGRAINE) 250-250-65 MG per tablet Take 1 tablet by mouth every 6 (six) hours as needed for headache.  Marland Kitchen atorvastatin (LIPITOR) 80 MG tablet TAKE ONE TABLET BY MOUTH ONCE DAILY (Patient taking differently: TAKE ONE TABLET BY MOUTH ONCE DAILY AT BEDTIME)  . doxycycline (VIBRA-TABS) 100 MG tablet Take 1 tablet (100 mg total) by mouth 2 (two) times daily. (Patient taking differently: Take 100 mg by mouth 2 (two) times daily. 10 day course started 09/07/16)  . fluticasone (FLONASE) 50 MCG/ACT nasal spray Place 2 sprays into both nostrils at bedtime.  . Insulin Isophane & Regular Human (HUMULIN 70/30 KWIKPEN) (70-30) 100 UNIT/ML PEN Inject 16 units subcutaneously before breakfast and 20 units before supper (Patient taking differently: Inject 20-25 Units into the skin See admin instructions. Inject 20 units subcutaneously before breakfast and 25 units before supper)  . loratadine (CLARITIN) 10 MG tablet Take 10 mg by mouth at bedtime.   . metFORMIN (GLUCOPHAGE) 500 MG tablet TAKE ONE TABLET BY MOUTH TWICE DAILY WITH MEALS  . nitroGLYCERIN (NITROSTAT) 0.4 MG SL tablet Place 1 tablet (0.4 mg total) under the tongue every 5 (five) minutes as needed. For chest pain (Patient taking differently: Place 0.4 mg under the tongue every 5 (five) minutes as needed for chest pain. )  . pantoprazole (PROTONIX) 40 MG tablet TAKE ONE TABLET BY MOUTH ONCE DAILY (Patient taking differently: TAKE ONE TABLET BY MOUTH ONCE DAILY AT BEDTIME)  . prednisoLONE acetate (PRED FORTE) 1 % ophthalmic suspension Place 1 drop into the left eye at bedtime.   . predniSONE (DELTASONE) 20 MG tablet 2 tabs po qd  x 5d (Patient taking differently: Take 40 mg by mouth daily with breakfast. 5 day course started 09/07/16)  . FLOVENT HFA 220 MCG/ACT inhaler INHALE TWO PUFFS BY MOUTH TWICE  DAILY (Patient not taking: Reported on 09/11/2016)    FAMILY HISTORY:  Her indicated that her mother is deceased. She indicated that her father is deceased. She indicated that her sister is alive. She indicated that only one of her five brothers is alive. She indicated that the status of her neg hx is unknown.    SOCIAL HISTORY: She  reports that she quit smoking about 7 years ago. Her smoking use included Cigarettes. She has a 45.00 pack-year smoking history. She has never used smokeless tobacco. She reports that she does not drink alcohol or use drugs.  REVIEW OF SYSTEMS:   Unable, as per HPI obtained from records and RN.   SUBJECTIVE:    VITAL SIGNS: BP 107/69 (BP Location: Left Arm)   Pulse (!) 108   Temp 97.9 F (36.6 C) (Oral)   Resp (!) 25   Ht '5\' 2"'$  (1.575 m)   Wt 83.2 kg (183 lb 8 oz)   SpO2 100%   BMI 33.56 kg/m   HEMODYNAMICS:    VENTILATOR SETTINGS: Vent Mode: PRVC FiO2 (%):  [100 %] 100 % Set Rate:  [16 bmp] 16 bmp Vt Set:  [400 mL] 400 mL PEEP:  [8 cmH20] 8 cmH20  INTAKE / OUTPUT: I/O last 3 completed shifts: In: 4655.8 [I.V.:4445.8; Other:10; IV Piggyback:200] Out: 1200 [Urine:1156; Drains:38; Stool:6]  PHYSICAL EXAMINATION: General:  Chronically ill appearing female, NAD  Neuro:  Sedated, RASS -1, follows commands, nods appropriately when stimulated  HEENT:  Mm moist, no JVD, ETT  Cardiovascular:  s1s2 rrr, NSR 90's  Lungs:  resps even non labored on vent, diminished bases  Abdomen:  Round, mildly distended, -bs, diffusely tender  Musculoskeletal:  Warm and dry, no sig edema   LABS:  BMET  Recent Labs Lab 09/12/16 0422 09/13/16 0513 09/14/16 0716  NA 139 139 143  K 3.2* 3.9 3.3*  CL 111 114* 111  CO2 23 20* 19*  BUN '10 10 8  '$ CREATININE 0.70 0.66 1.00  GLUCOSE 112* 164* 228*    Electrolytes  Recent Labs Lab 09/12/16 0422 09/13/16 0513 09/14/16 0716  CALCIUM 7.9* 8.2* 9.4  MG  --   --  2.1    CBC  Recent Labs Lab  09/11/16 1030 09/12/16 0422 09/14/16 0716  WBC 15.4* 11.0* 14.3*  HGB 15.2* 11.9* 11.2*  HCT 45.3 36.2 34.9*  PLT 261 186 246    Coag's  Recent Labs Lab 09/12/16 0422  INR 1.34    Sepsis Markers  Recent Labs Lab 09/11/16 1045 09/14/16 0716  LATICACIDVEN 1.71 5.6*    ABG No results for input(s): PHART, PCO2ART, PO2ART in the last 168 hours.  Liver Enzymes  Recent Labs Lab 09/11/16 1030 09/12/16 0422  AST 17 14*  ALT 16 13*  ALKPHOS 86 67  BILITOT 0.5 0.7  ALBUMIN 3.5 2.6*    Cardiac Enzymes  Recent Labs Lab 09/14/16 0716  TROPONINI 0.12*    Glucose  Recent Labs Lab 09/13/16 0744 09/13/16 1224 09/13/16 1706 09/13/16 2015 09/14/16 0441 09/14/16 0702  GLUCAP 176* 182* 280* 208* 137* 131*    Imaging Dg Chest Port 1 View  Result Date: 09/14/2016 CLINICAL DATA:  Central line placement EXAM: PORTABLE CHEST 1 VIEW COMPARISON:  09/14/2016 FINDINGS: Cardiomediastinal silhouette is stable. Stable endotracheal tube position. NG tube in  place. There is persistent consolidation in right upper lobe. Left IJ central line with tip in distal SVC. No pneumothorax. IMPRESSION: Stable support apparatus. Left IJ central line in place. No pneumothorax. Electronically Signed   By: [name] M.D.   On: 09/14/2016 09:09   Dg Chest Port 1 View  Result Date: 09/14/2016 CLINICAL DATA:  Hypoxia EXAM: PORTABLE CHEST 1 VIEW COMPARISON:  September 12, 2016 chest radiograph and chest CT June 14, 2016 FINDINGS: Endotracheal tube tip is 3.1 cm above the carina. No pneumothorax. There is persistent airspace consolidation throughout much of the right upper lobe. There is suspected residual mass in this area medially. The lungs elsewhere are clear. Heart size and pulmonary vascularity are normal. No adenopathy. There is atherosclerotic calcification in the aorta. No bone lesions. Incidental note is made of small cervical ribs bilaterally. IMPRESSION: Endotracheal tube as described  without pneumothorax. There is airspace consolidation throughout much the right upper lobe. There is a nodular area in the medial aspect of the right upper lobe measuring 2.3 x 2.0 cm, concerning for residual mass in this area. This finding may warrant correlation with contrast enhanced chest CT to further assess. 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 Lungs elsewhere clear. Stable cardiac silhouette. There is aortic atherosclerosis. Electronically Signed   By: Lowella Grip III M.D.   On: 09/14/2016 08:06     STUDIES:  CT abd/pelvis 11/19>>> Focal diverticulitis of the mid sigmoid colon with adjacent 2.5 x 4.1 cm abscess and foci of pneumoperitoneum in this area. Small amount of complex free fluid within the pelvis. CT abd/pelvis 11/22>>> 2D echo 11/22>>>  CULTURES: BC x 2 11/19>>>ngtd>> Abscess 11/20>>> rare GNR>>> BC x 2 11/22>>>  ANTIBIOTICS: Zosyn 11/19>>> vanc 11/22>>> anidulafungin 11/22>>>  SIGNIFICANT EVENTS: 11/22 PEA rrest >> brief, 6 mins CPR  LINES/TUBES: ETT 11/22>>> R fem aline 11/22>>> L IJ CVL 11/22>>>  DISCUSSION: 70 yo female admitted 11/19 with diverticulitis with abscess s/p IR drain.  On 11/22 had brief PEA arrest, shock, ICU tx.   ASSESSMENT / PLAN:  PULMONARY Acute respiratory failure - post arrest  Hx lung ca -- NSC in 2012 s/p chemo and XRT  Hx COPD  P:   Vent support - 8cc/kg  F/u CXR  F/u ABG PRN xopenex   CARDIOVASCULAR SVT  PEA arrest - 6 mins CPR  Hx HTN  Shock - ?cardiogenic v septic P:  Cardiology to see  Trend troponin  Neo gtt - wean as able to keep MAP >65 No hypothermia protocol -- good mental status post arrest  Stress steroids  F/u lactate  Echo now  Check CVP   RENAL Hypokalemia - mild  Mild metabolic acidosis  P:   Trend lactate  Gentle volume  F/u chem  Replete K PRN  GASTROINTESTINAL Sigmoid Diverticulitis with abscess s/p IR drain  P:    CT abd/pelvis now  NPO  Surgery following  Add vanc, anidulafungin as above   HEMATOLOGIC No active issue  P:  F/u cbc  SQ heparin   INFECTIOUS Diverticular abscess  R/o sepsis  P:   Broad spectrum abx as above  Re-culture  Pct, lactate  Follow abscess culture  CT abd/pelvis as above   ENDOCRINE Hx DM  Relative adrenal insufficiency -- on steroids pre-admit  P:   SSI   NEUROLOGIC No active issue -- had good mental status post brief arrest  Sedation needs on vent  P:  RASS goal: -1 fent gtt  PRN versed    FAMILY  - Updates:  Family updated 11/22   - Inter-disciplinary family meet or Palliative Care meeting due by:  11/29   Nickolas Madrid, NP 09/14/2016  9:29 AM Pager: [phone] or [phone]  STAFF NOTE: Linwood Dibbles, MD FACP have personally reviewed patient's available data, including medical history, events of note, physical examination and test results as part of my evaluation. I have discussed with resident/NP and other care providers such as pharmacist, RN and RRT. In addition, I personally evaluated patient and elicited key findings of: coded in am after tachy svt 190 then to pea and got shock, arrived in unit, followed commands with severe agitation , ronchi rul worse then left, has h/o lung cancer with some chronic changes their also, blood in ET noted, likley traumatic intubation, ecg now with conduction delay, diff dz is worsening abscess / perf or complication related to drain / abscess, vs cardiac in nature, now with asp likely , add vanc, add anidulo, get stat CT scan abdo /pelvis, cards consult, I did echo see report, will get official echo, trop, she is in shock, emergent a line placed and line,  Neo to map goals 60, trop assesement, NPO, abg, lytes stat, I then updated family in full  Echo shows RV wnl, unlikley PE that causes arrest, also not cooling candidate with GCS greater 9 and fc The patient is critically ill with multiple  organ systems failure and requires high complexity decision making for assessment and support, frequent evaluation and titration of therapies, application of advanced monitoring technologies and extensive interpretation of multiple databases.   Critical Care Time devoted to patient care services described in this note is 90 Minutes. This time reflects time of care of this signee: Merrie Roof, MD FACP. This critical care time does not reflect procedure time, or teaching time or supervisory time of PA/NP/Med student/Med Resident etc but could involve care discussion time. Rest per NP/medical resident whose note is outlined above and that I agree with   Lavon Paganini. Titus Mould, MD, [place] Pgr: [facility] 09/14/2016 9:57 AM

## 2016-09-14 NOTE — Progress Notes (Signed)
Inpatient Diabetes Program Recommendations  AACE/ADA: New Consensus Statement on Inpatient Glycemic Control (2015)  Target Ranges:  Prepandial:   less than 140 mg/dL      Peak postprandial:   less than 180 mg/dL (1-2 hours)      Critically ill patients:  140 - 180 mg/dL   Lab Results  Component Value Date   GLUCAP 131 (H) 09/14/2016   HGBA1C 7.1 (H) 09/11/2016   Results for KASHMIR, LEEDY (MRN [id number]) as of 09/14/2016 09:59  Ref. Range 09/13/2016 12:24 09/13/2016 17:06 09/13/2016 20:15 09/14/2016 04:41 09/14/2016 07:02  Glucose-Capillary Latest Ref Range: 65 - 99 mg/dL 182 (H) 280 (H) 208 (H) 137 (H) 131 (H)   Review of Glycemic Control  Diabetes history:       Type 2, Steroids this admission, intubated  Outpatient Diabetes medications:       Humulin 70/30 20 units qam, 25 units pre-supper, Metformin '500mg'$  bid  Current orders for Inpatient glycemic control:       Lantus 5 units qday,       Novolog 0-20 units Q4H  Inpatient Diabetes Program Recommendations, please consider:       Phase 1 of Adult ICU Glycemic Control Order Set      Will continue to monitor  Thank you,  Windy Carina, RN, BSN Diabetes Coordinator Inpatient Diabetes Program [phone] (Team Pager)

## 2016-09-14 NOTE — Progress Notes (Addendum)
Pharmacy Antibiotic Note  [name] is a 70 y.o. female admitted on 09/11/2016 with right lower abdominal pain and diverticulitis. Pharmacy has been consulted for Eraxis/Zosyn dosing. Patient coded this morning and was transferred to the ICU with concern for an infectious source. Of note, an abdominal  drain was placed on 11/20. A CT of her abdomen will be obtained to assess diverticulitis/source of infection. WBC up to 14.3 and patient is afebrile. Creatinine-1. CrCL~ 50 ml/min.  Plan: Zosyn 3.375 g every 8 hours  Eraxis 200 mg once, then 100 mg every 24 hours.  Follow-up clinical signs/symptoms of infection, renal function, length of therapy  Height: '5\' 2"'$  (157.5 cm) Weight: 183 lb 8 oz (83.2 kg) IBW/kg (Calculated) : 50.1  Temp (24hrs), Avg:97.6 F (36.4 C), Min:97.5 F (36.4 C), Max:97.7 F (36.5 C)   Recent Labs Lab 09/11/16 1030 09/11/16 1045 09/12/16 0422 09/13/16 0513 09/14/16 0716  WBC 15.4*  --  11.0*  --  14.3*  CREATININE 0.92  --  0.70 0.66 1.00  LATICACIDVEN  --  1.71  --   --  5.6*    Estimated Creatinine Clearance: 53.8 mL/min (by C-G formula based on SCr of 1 mg/dL).    Allergies  Allergen Reactions  . Lactose Intolerance (Gi) Diarrhea  . Ibuprofen     REACTION: Anxiousness, hyperventilates    Antimicrobials this admission: Zosyn 11/19>> Vancomycin 11/22>> Eraxis 11/22>>  Microbiology results: 11/19  BCx: NGTD 11/19 UCx: Multiple species  11/20 Abscess: Rare gm negative rods   Thank you for allowing pharmacy to be a part of this patient's care.  Ihor Austin, PharmD PGY1 Pharmacy Resident Pager; [phone] 09/14/2016 9:04 AM

## 2016-09-14 NOTE — Procedures (Signed)
Arterial Catheter Insertion Procedure Note SINTHIA KARABIN [id number] [birth year]  Procedure: Insertion of Arterial Catheter  Indications: Blood pressure monitoring and Frequent blood sampling  Procedure Details Consent: Unable to obtain consent because of emergent medical necessity. Time Out: Verified patient identification, verified procedure, site/side was marked, verified correct patient position, special equipment/implants available, medications/allergies/relevent history reviewed, required imaging and test results available.  Performed  Maximum sterile technique was used including antiseptics, cap, gloves, gown, hand hygiene, mask and sheet. Skin prep: Chlorhexidine; local anesthetic administered 20 gauge catheter was inserted into right femoral artery using the Seldinger technique.  Evaluation Blood flow good; BP tracing good. Complications: No apparent complications.   Raylene Miyamoto 09/14/2016  Post code Shock  [place]  Tennile Styles J. Titus Mould, MD, [place] Pgr: [facility]

## 2016-09-14 NOTE — Anesthesia Procedure Notes (Signed)
Procedure Name: Intubation Date/Time: 09/14/2016 7:03 AM Performed by: Carney Living Pre-anesthesia Checklist: Patient identified, Emergency Drugs available, Suction available, Patient being monitored and Timeout performed Oxygen Delivery Method: Ambu bag Laryngoscope Size: Mac and 4 Grade View: Grade II Tube type: Subglottic suction tube Tube size: 7.5 mm Number of attempts: 1 Airway Equipment and Method: Stylet Placement Confirmation: ETT inserted through vocal cords under direct vision,  positive ETCO2 and breath sounds checked- equal and bilateral Secured at: 21 cm Tube secured with: Tape Dental Injury: Teeth and Oropharynx as per pre-operative assessment

## 2016-09-14 NOTE — Progress Notes (Signed)
Per Apolinar Junes, NP may access new Central Line.

## 2016-09-15 DIAGNOSIS — K5720 Diverticulitis of large intestine with perforation and abscess without bleeding: Principal | ICD-10-CM

## 2016-09-15 DIAGNOSIS — J9601 Acute respiratory failure with hypoxia: Secondary | ICD-10-CM

## 2016-09-15 DIAGNOSIS — I5181 Takotsubo syndrome: Secondary | ICD-10-CM

## 2016-09-15 LAB — BASIC METABOLIC PANEL
ANION GAP: 6 (ref 5–15)
BUN: 11 mg/dL (ref 6–20)
CO2: 22 mmol/L (ref 22–32)
Calcium: 8.2 mg/dL — ABNORMAL LOW (ref 8.9–10.3)
Chloride: 109 mmol/L (ref 101–111)
Creatinine, Ser: 1.09 mg/dL — ABNORMAL HIGH (ref 0.44–1.00)
GFR calc non Af Amer: 51 mL/min — ABNORMAL LOW (ref >60)
GFR, EST AFRICAN AMERICAN: 59 mL/min — AB (ref >60)
GLUCOSE: 149 mg/dL — AB (ref 65–99)
POTASSIUM: 3.8 mmol/L (ref 3.5–5.1)
Sodium: 137 mmol/L (ref 135–145)

## 2016-09-15 LAB — PROTIME-INR
INR: 1.29
PROTHROMBIN TIME: 16.2 s — AB (ref 11.4–15.2)

## 2016-09-15 LAB — POCT I-STAT, CHEM 8
BUN: 8 mg/dL (ref 6–20)
CALCIUM ION: 1.4 mmol/L (ref 1.15–1.40)
CHLORIDE: 108 mmol/L (ref 101–111)
CREATININE: 0.8 mg/dL (ref 0.44–1.00)
Glucose, Bld: 216 mg/dL — ABNORMAL HIGH (ref 65–99)
HCT: 33 % — ABNORMAL LOW (ref 36.0–46.0)
Hemoglobin: 11.2 g/dL — ABNORMAL LOW (ref 12.0–15.0)
Potassium: 3.3 mmol/L — ABNORMAL LOW (ref 3.5–5.1)
Sodium: 144 mmol/L (ref 135–145)
TCO2: 22 mmol/L (ref 0–100)

## 2016-09-15 LAB — CBC
HEMATOCRIT: 33.1 % — AB (ref 36.0–46.0)
HEMOGLOBIN: 10.6 g/dL — AB (ref 12.0–15.0)
MCH: 29 pg (ref 26.0–34.0)
MCHC: 32 g/dL (ref 30.0–36.0)
MCV: 90.4 fL (ref 78.0–100.0)
Platelets: 219 10*3/uL (ref 150–400)
RBC: 3.66 MIL/uL — AB (ref 3.87–5.11)
RDW: 15.2 % (ref 11.5–15.5)
WBC: 11.9 10*3/uL — ABNORMAL HIGH (ref 4.0–10.5)

## 2016-09-15 LAB — MAGNESIUM: Magnesium: 1.8 mg/dL (ref 1.7–2.4)

## 2016-09-15 LAB — POCT I-STAT 3, ART BLOOD GAS (G3+)
ACID-BASE DEFICIT: 4 mmol/L — AB (ref 0.0–2.0)
BICARBONATE: 22.3 mmol/L (ref 20.0–28.0)
O2 SAT: 92 %
PO2 ART: 70 mmHg — AB (ref 83.0–108.0)
TCO2: 24 mmol/L (ref 0–100)
pCO2 arterial: 44.6 mmHg (ref 32.0–48.0)
pH, Arterial: 7.307 — ABNORMAL LOW (ref 7.350–7.450)

## 2016-09-15 LAB — GLUCOSE, CAPILLARY
GLUCOSE-CAPILLARY: 149 mg/dL — AB (ref 65–99)
GLUCOSE-CAPILLARY: 128 mg/dL — AB (ref 65–99)
Glucose-Capillary: 131 mg/dL — ABNORMAL HIGH (ref 65–99)
Glucose-Capillary: 136 mg/dL — ABNORMAL HIGH (ref 65–99)
Glucose-Capillary: 151 mg/dL — ABNORMAL HIGH (ref 65–99)
Glucose-Capillary: 164 mg/dL — ABNORMAL HIGH (ref 65–99)
Glucose-Capillary: 171 mg/dL — ABNORMAL HIGH (ref 65–99)

## 2016-09-15 LAB — APTT: APTT: 26 s (ref 24–36)

## 2016-09-15 LAB — LACTIC ACID, PLASMA: Lactic Acid, Venous: 0.8 mmol/L (ref 0.5–1.9)

## 2016-09-15 LAB — PROCALCITONIN: PROCALCITONIN: 0.62 ng/mL

## 2016-09-15 MED ORDER — FENTANYL CITRATE (PF) 100 MCG/2ML IJ SOLN
12.5000 ug | INTRAMUSCULAR | Status: DC | PRN
  Administered 2016-09-15 – 2016-09-17 (×7): 50 ug via INTRAVENOUS
  Administered 2016-09-20 – 2016-09-21 (×2): 12.5 ug via INTRAVENOUS
  Filled 2016-09-15 (×9): qty 2

## 2016-09-15 MED ORDER — FENTANYL BOLUS VIA INFUSION
25.0000 ug | INTRAVENOUS | Status: DC | PRN
  Filled 2016-09-15: qty 50

## 2016-09-15 MED ORDER — MIDAZOLAM HCL 2 MG/2ML IJ SOLN: 1.0000 mg | INTRAMUSCULAR | Status: DC | PRN

## 2016-09-15 MED ORDER — METOPROLOL TARTRATE 5 MG/5ML IV SOLN
2.5000 mg | Freq: Four times a day (QID) | INTRAVENOUS | Status: DC
  Administered 2016-09-15 – 2016-09-20 (×19): 2.5 mg via INTRAVENOUS
  Filled 2016-09-15 (×19): qty 5

## 2016-09-15 MED FILL — Medication: Qty: 1 | Status: AC

## 2016-09-15 NOTE — Progress Notes (Signed)
1 Day Post-Op  Subjective: Noted events of yesterday, awake on vent  Objective: Vital signs in last 24 hours: Temp:  [97.6 F (36.4 C)-98.9 F (37.2 C)] 98.5 F (36.9 C) (11/23 0800) Pulse Rate:  [0-133] 71 (11/23 0852) Resp:  [0-26] 20 (11/23 0852) BP: (90-128)/(49-75) 128/62 (11/23 0852) SpO2:  [0 %-100 %] 97 % (11/23 0852) FiO2 (%):  [40 %] 40 % (11/23 0852) Weight:  [88.2 kg (194 lb 7.1 oz)] 88.2 kg (194 lb 7.1 oz) (11/23 0444) Last BM Date: 09/13/16  Intake/Output from previous day: 11/22 0701 - 11/23 0700 In: 5573.6 [I.V.:4755.6; IV Piggyback:810] Out: 1260 [Urine:845; Emesis/NG output:405; Drains:10] Intake/Output this shift: No intake/output data recorded.  General appearance: cooperative Resp: few rales Cardio: reg with occ ectopy GI: soft, minimal tenderness LLQ, drain in place  Lab Results:   Recent Labs  09/14/16 2020 09/15/16 0450  WBC 12.0* 11.9*  HGB 10.8* 10.6*  HCT 33.9* 33.1*  PLT 247 219   BMET  Recent Labs  09/14/16 0716 09/14/16 2020 09/15/16 0450  NA 143  --  137  K 3.3*  --  3.8  CL 111  --  109  CO2 19*  --  22  GLUCOSE 228*  --  149*  BUN 8  --  11  CREATININE 1.00 1.16* 1.09*  CALCIUM 9.4  --  8.2*   PT/INR  Recent Labs  09/15/16 0450  LABPROT 16.2*  INR 1.29   ABG  Recent Labs  09/14/16 0930 09/15/16 0453  PHART 7.261* 7.307*  HCO3 22.4 22.3    Studies/Results: Ct Chest W Contrast  Result Date: 09/14/2016 CLINICAL DATA:  Diverticulitis post abscess drainage and surgery, complicated by a supraventricular tachycardia then cardiac arrest post CPR and defibrillation, intubation, hypertensive, history COPD, coronary disease, diabetes mellitus, hypertension, non-small-cell lung cancer EXAM: CT CHEST, ABDOMEN, AND PELVIS WITH CONTRAST TECHNIQUE: Multidetector CT imaging of the chest, abdomen and pelvis was performed following the standard protocol during bolus administration of intravenous contrast. Sagittal and  coronal MPR images reconstructed from axial data set. CONTRAST:  Dilute oral contrast.  100 cc Isovue 300 IV. COMPARISON:  CT chest 06/14/2016, CT abdomen and pelvis 09/11/2016 FINDINGS: CT CHEST FINDINGS Cardiovascular: Atherosclerotic calcifications aorta and coronary arteries. Aorta normal caliber. Thoracic vascular structures grossly patent on nondedicated exam. Small pericardial fusion. Mediastinum/Nodes: Nasogastric tube traverses esophagus into stomach. Tip of endotracheal tube above carina. Small dense calcification within RIGHT thyroid lobe is unchanged. Base of cervical region otherwise unremarkable. Palpable with soft tissue density at the RIGHT hilum and paramediastinal RIGHT upper lobe, patient with known radiation fibrosis. Lungs/Pleura: Small BILATERAL pleural effusions larger on RIGHT. In addition to the previously identified radiation fibrosis changes in the RIGHT upper lobe, additional RIGHT upper lobe consolidation is seen question pneumonia. Compressive atelectasis in the lower lobes. Additional scattered airspace infiltrates primarily in RIGHT lung which could represent infection or less likely asymmetric edema. No pneumothorax. Musculoskeletal: Essentially nondisplaced mid sternal fracture sagittal image 81. Fractures of the anterior RIGHT third and fifth ribs. CT ABDOMEN PELVIS FINDINGS Hepatobiliary: Gallbladder surgically absent. Mild central intrahepatic and extrahepatic biliary dilatation little changed. No focal liver lesions. Pancreas: Normal appearance Spleen: Normal appearance Adrenals/Urinary Tract: Adrenal glands normal appearance. Foley catheter decompresses urinary bladder. Kidneys in ureters normal appearance. Stomach/Bowel: Diverticulosis changes at the sigmoid colon with decreased surrounding inflammation versus prior study. Abscess collection seen on the previous exam no longer identified, pigtail drainage catheter at site. Normal appendix. Stomach and bowel loops otherwise  normal appearance. Vascular/Lymphatic: Atherosclerotic calcification aorta without aneurysm. No significant adenopathy. Reproductive: Uterus surgically absent. Other: Small amounts of free pelvic fluid and perihepatic/perisplenic fluid without free air. Small umbilical hernia containing fat. Musculoskeletal: No acute osseous findings. 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 Additional RIGHT upper lobe and scattered RIGHT lung infiltrates question pneumonia versus less likely asymmetric edema. Small BILATERAL pleural effusions RIGHT greater than LEFT with minimal adjacent compressive atelectasis of the lower lobes. Improved diverticulitis changes with resolution of the previously identified abscess collection adjacent to the sigmoid colon. Fractures of the sternum and the anterior RIGHT third and fifth ribs suspect related to CPR. Small pericardial fusion. Electronically Signed   By: Lavonia Dana M.D.   On: 09/14/2016 13:01   Ct Abdomen Pelvis W Contrast  Result Date: 09/14/2016 CLINICAL DATA:  Diverticulitis post abscess drainage and surgery, complicated by a supraventricular tachycardia then cardiac arrest post CPR and defibrillation, intubation, hypertensive, history COPD, coronary disease, diabetes mellitus, hypertension, non-small-cell lung cancer EXAM: CT CHEST, ABDOMEN, AND PELVIS WITH CONTRAST TECHNIQUE: Multidetector CT imaging of the chest, abdomen and pelvis was performed following the standard protocol during bolus administration of intravenous contrast. Sagittal and coronal MPR images reconstructed from axial data set. CONTRAST:  Dilute oral contrast.  100 cc Isovue 300 IV. COMPARISON:  CT chest 06/14/2016, CT abdomen and pelvis 09/11/2016 FINDINGS: CT CHEST FINDINGS Cardiovascular: Atherosclerotic calcifications aorta and coronary arteries. Aorta normal caliber. Thoracic vascular structures grossly patent on nondedicated exam. Small  pericardial fusion. Mediastinum/Nodes: Nasogastric tube traverses esophagus into stomach. Tip of endotracheal tube above carina. Small dense calcification within RIGHT thyroid lobe is unchanged. Base of cervical region otherwise unremarkable. Palpable with soft tissue density at the RIGHT hilum and paramediastinal RIGHT upper lobe, patient with known radiation fibrosis. Lungs/Pleura: Small BILATERAL pleural effusions larger on RIGHT. In addition to the previously identified radiation fibrosis changes in the RIGHT upper lobe, additional RIGHT upper lobe consolidation is seen question pneumonia. Compressive atelectasis in the lower lobes. Additional scattered airspace infiltrates primarily in RIGHT lung which could represent infection or less likely asymmetric edema. No pneumothorax. Musculoskeletal: Essentially nondisplaced mid sternal fracture sagittal image 81. Fractures of the anterior RIGHT third and fifth ribs. CT ABDOMEN PELVIS FINDINGS Hepatobiliary: Gallbladder surgically absent. Mild central intrahepatic and extrahepatic biliary dilatation little changed. No focal liver lesions. Pancreas: Normal appearance Spleen: Normal appearance Adrenals/Urinary Tract: Adrenal glands normal appearance. Foley catheter decompresses urinary bladder. Kidneys in ureters normal appearance. Stomach/Bowel: Diverticulosis changes at the sigmoid colon with decreased surrounding inflammation versus prior study. Abscess collection seen on the previous exam no longer identified, pigtail drainage catheter at site. Normal appendix. Stomach and bowel loops otherwise normal appearance. Vascular/Lymphatic: Atherosclerotic calcification aorta without aneurysm. No significant adenopathy. Reproductive: Uterus surgically absent. Other: Small amounts of free pelvic fluid and perihepatic/perisplenic fluid without free air. Small umbilical hernia containing fat. Musculoskeletal: No acute osseous findings. IMPRESSION: Radiation fibrosis changes in  medial RIGHT upper lobe extending to the superior RIGHT hilum. Additional RIGHT upper lobe and scattered RIGHT lung infiltrates question pneumonia versus less likely asymmetric edema. Small BILATERAL pleural effusions RIGHT greater than LEFT with minimal adjacent compressive atelectasis of the lower lobes. Improved diverticulitis changes with resolution of the previously identified abscess collection adjacent to the sigmoid colon. Fractures of the sternum and the anterior RIGHT third and fifth ribs suspect related to CPR. Small pericardial fusion. Electronically Signed   By: Lavonia Dana M.D.   On:  09/14/2016 13:01   Dg Chest Port 1 View  Result Date: 09/14/2016 CLINICAL DATA:  Central line insertion. EXAM: PORTABLE CHEST 1 VIEW 6:36 p.m. COMPARISON:  09/14/2016 8:40 a.m. FINDINGS: New right central line is been inserted and the tip appears in excellent position. Left central line, endotracheal tube, and NG tube appear in good position, unchanged. Right pleural effusion caps the right apex, unchanged. No pneumothorax. Scarring at the superior aspect of the right hilum, obscured by the ventilation apparatus. IMPRESSION: New right-sided central line appears in good position. No acute changes. Electronically Signed   By: Lorriane Shire M.D.   On: 09/14/2016 19:01   Dg Chest Port 1 View  Result Date: 09/14/2016 CLINICAL DATA:  Central line placement EXAM: PORTABLE CHEST 1 VIEW COMPARISON:  09/14/2016 FINDINGS: Cardiomediastinal silhouette is stable. Stable endotracheal tube position. NG tube in place. There is persistent consolidation in right upper lobe. Left IJ central line with tip in distal SVC. No pneumothorax. IMPRESSION: Stable support apparatus. Left IJ central line in place. No pneumothorax. Electronically Signed   By: [name] M.D.   On: 09/14/2016 09:09   Dg Chest Port 1 View  Result Date: 09/14/2016 CLINICAL DATA:  Hypoxia EXAM: PORTABLE CHEST 1 VIEW COMPARISON:  September 12, 2016 chest  radiograph and chest CT June 14, 2016 FINDINGS: Endotracheal tube tip is 3.1 cm above the carina. No pneumothorax. There is persistent airspace consolidation throughout much of the right upper lobe. There is suspected residual mass in this area medially. The lungs elsewhere are clear. Heart size and pulmonary vascularity are normal. No adenopathy. There is atherosclerotic calcification in the aorta. No bone lesions. Incidental note is made of small cervical ribs bilaterally. IMPRESSION: Endotracheal tube as described without pneumothorax. There is airspace consolidation throughout much the right upper lobe. There is a nodular area in the medial aspect of the right upper lobe measuring 2.3 x 2.0 cm, concerning for residual mass in this area. This finding may warrant correlation with contrast enhanced chest CT to further assess. 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 Lungs elsewhere clear. Stable cardiac silhouette. There is aortic atherosclerosis. Electronically Signed   By: Lowella Grip III M.D.   On: 09/14/2016 08:06    Anti-infectives: Anti-infectives    Start     Dose/Rate Route Frequency Ordered Stop   09/15/16 1000  anidulafungin (ERAXIS) 100 mg in sodium chloride 0.9 % 100 mL IVPB     100 mg over 90 Minutes Intravenous Every 24 hours 09/14/16 0906     09/14/16 1000  anidulafungin (ERAXIS) 200 mg in sodium chloride 0.9 % 200 mL IVPB     200 mg over 180 Minutes Intravenous  Once 09/14/16 0841 09/14/16 2255   09/14/16 0800  vancomycin (VANCOCIN) IVPB 1000 mg/200 mL premix     1,000 mg 200 mL/hr over 60 Minutes Intravenous Every 12 hours 09/14/16 0737     09/11/16 2200  piperacillin-tazobactam (ZOSYN) IVPB 3.375 g     3.375 g 12.5 mL/hr over 240 Minutes Intravenous Every 8 hours 09/11/16 1840     09/11/16 1400  piperacillin-tazobactam (ZOSYN) IVPB 3.375 g     3.375 g 100 mL/hr over 30 Minutes Intravenous  Once 09/11/16 1346  09/11/16 1502      Assessment/Plan: Diverticulitis with abscess - S/P perc drain, abscess resolved on CT, continue ABX. Clears OK if extubated. VDRF - per CCM, possible extubation this AM Cardiac arrest/CHF - per cardiology  LOS: 3 days    Elleanor Guyett E 09/15/2016

## 2016-09-15 NOTE — Progress Notes (Signed)
261m's of fentanyl wasted with SDoretha Souas second RN to verify.

## 2016-09-15 NOTE — Progress Notes (Signed)
Patient Name: [name] Date of Encounter: 09/15/2016  Primary Cardiologist: [facility] Problem List     Active Problems:   Non-occlusive coronary artery disease   Torsades de pointes [facility])   Cardiac arrest with ventricular fibrillation ([place])   Takotsubo cardiomyopathy   Small cell lung cancer ([place])   DM (diabetes mellitus), type 2, uncontrolled with complications ([place])   Hyperlipidemia, mixed   GERD   COPD (chronic obstructive pulmonary disease) ([place])   Diverticulitis of large intestine with abscess   Diverticulitis of large intestine with perforation and abscess   Abdominal abscess ([place])   Colonic diverticular abscess   Acute respiratory failure ([place])     Subjective   Remains intubated & a bit sedated.  Wakes up with touch. Want ETT out.  Inpatient Medications    Scheduled Meds: . anidulafungin  100 mg Intravenous Q24H  . aspirin  81 mg Oral Daily  . chlorhexidine gluconate (MEDLINE KIT)  15 mL Mouth Rinse BID  . famotidine (PEPCID) IV  20 mg Intravenous Q24H  . fentaNYL (SUBLIMAZE) injection  50 mcg Intravenous Once  . heparin  5,000 Units Subcutaneous Q8H  . hydrocortisone sodium succinate  50 mg Intravenous Q6H  . insulin aspart  0-20 Units Subcutaneous Q4H  . ketorolac  1 drop Left Eye QID  . mouth rinse  15 mL Mouth Rinse 10 times per day  . piperacillin-tazobactam (ZOSYN)  IV  3.375 g Intravenous Q8H  . sodium chloride flush  3 mL Intravenous Q12H  . sodium chloride flush  3 mL Intravenous Q12H  . sodium chloride flush  3 mL Intravenous Q12H  . vancomycin  1,000 mg Intravenous Q12H   Continuous Infusions: . sodium chloride 1,000 mL (09/15/16 0700)  . fentaNYL infusion INTRAVENOUS 75 mcg/hr (09/15/16 0830)  . phenylephrine (NEO-SYNEPHRINE) Adult infusion Stopped (09/15/16 0945)   PRN Meds: sodium chloride, sodium chloride, acetaminophen **OR** acetaminophen, acetaminophen, bisacodyl, fentaNYL, levalbuterol, magnesium citrate, midazolam,  ondansetron **OR** ondansetron (ZOFRAN) IV, ondansetron (ZOFRAN) IV, sodium chloride flush, sodium chloride flush   Vital Signs    Vitals:   09/15/16 0800 09/15/16 0852 09/15/16 0900 09/15/16 1000  BP: (!) 119/55 128/62 (!) 108/49 108/70  Pulse: (!) 55 71 65 75  Resp: 12 20 10 15   Temp: 98.5 F (36.9 C)     TempSrc: Oral     SpO2: 98% 97% 98% 99%  Weight:      Height:        Intake/Output Summary (Last 24 hours) at 09/15/16 1052 Last data filed at 09/15/16 1000  Gross per 24 hour  Intake          6390.59 ml  Output             1260 ml  Net          5130.59 ml   Filed Weights   09/13/16 0530 09/14/16 0443 09/15/16 0444  Weight: 83.6 kg (184 lb 3.2 oz) 83.2 kg (183 lb 8 oz) 88.2 kg (194 lb 7.1 oz)    Physical Exam   GEN: Intubated but not overly sedated. Reactive. Responsive; gesturing to have ETT removed HEENT: Grossly normal.  Neck: Supple, no JVD, carotid bruits, or masses. Cardiac: RRR with normal S1 and split S2. Cannot exclude gallop versus split S2 from IVCD. Soft 1/6 DM heard at the LUSB Respiratory: Coarse ventilatory respirations but with no obvious rales or rhonchi GI: Soft but remains tender. Nondistended., Minimal bowel sounds Skin: warm and dry, no rash.  Labs    CBC  Recent Labs  09/14/16 2020 09/15/16 0450  WBC 12.0* 11.9*  HGB 10.8* 10.6*  HCT 33.9* 33.1*  MCV 93.1 90.4  PLT 247 594   Basic Metabolic Panel  Recent Labs  09/14/16 0716 09/14/16 2020 09/15/16 0450  NA 143  --  137  K 3.3*  --  3.8  CL 111  --  109  CO2 19*  --  22  GLUCOSE 228*  --  149*  BUN 8  --  11  CREATININE 1.00 1.16* 1.09*  CALCIUM 9.4  --  8.2*  MG 2.1  --  1.8   Liver Function Tests No results for input(s): AST, ALT, ALKPHOS, BILITOT, PROT, ALBUMIN in the last 72 hours. No results for input(s): LIPASE, AMYLASE in the last 72 hours. Cardiac Enzymes  Recent Labs  09/14/16 0716 09/14/16 2020  TROPONINI 0.12* 0.52*   BNP Invalid input(s):  POCBNP D-Dimer No results for input(s): DDIMER in the last 72 hours. Hemoglobin A1C No results for input(s): HGBA1C in the last 72 hours. Fasting Lipid Panel No results for input(s): CHOL, HDL, LDLCALC, TRIG, CHOLHDL, LDLDIRECT in the last 72 hours. Thyroid Function Tests No results for input(s): TSH, T4TOTAL, T3FREE, THYROIDAB in the last 72 hours.  Invalid input(s): FREET3  Telemetry    Sinus rhythm in 70s - Personally Reviewed  ECG    Personally Reviewed: Sinus bradycardia, rate 57. Nonspecific IVCD (LBBB pattern).  Cannot rule out anteroseptal infarct, age undetermined with T wave abnormalities consider lateral ischemia.-QT-QTc prolonged with anterolateral ST and T-wave changes.  Radiology    Ct Chest W Contrast  Result Date: 09/14/2016 CLINICAL DATA:  Diverticulitis post abscess drainage and surgery, complicated by a supraventricular tachycardia then cardiac arrest post CPR and defibrillation, intubation, hypertensive, history COPD, coronary disease, diabetes mellitus, hypertension, non-small-cell lung cancer EXAM: CT CHEST, ABDOMEN, AND PELVIS WITH CONTRAST TECHNIQUE: Multidetector CT imaging of the chest, abdomen and pelvis was performed following the standard protocol during bolus administration of intravenous contrast. Sagittal and coronal MPR images reconstructed from axial data set. CONTRAST:  Dilute oral contrast.  100 cc Isovue 300 IV. COMPARISON:  CT chest 06/14/2016, CT abdomen and pelvis 09/11/2016 FINDINGS: CT CHEST FINDINGS Cardiovascular: Atherosclerotic calcifications aorta and coronary arteries. Aorta normal caliber. Thoracic vascular structures grossly patent on nondedicated exam. Small pericardial fusion. Mediastinum/Nodes: Nasogastric tube traverses esophagus into stomach. Tip of endotracheal tube above carina. Small dense calcification within RIGHT thyroid lobe is unchanged. Base of cervical region otherwise unremarkable. Palpable with soft tissue density at the  RIGHT hilum and paramediastinal RIGHT upper lobe, patient with known radiation fibrosis. Lungs/Pleura: Small BILATERAL pleural effusions larger on RIGHT. In addition to the previously identified radiation fibrosis changes in the RIGHT upper lobe, additional RIGHT upper lobe consolidation is seen question pneumonia. Compressive atelectasis in the lower lobes. Additional scattered airspace infiltrates primarily in RIGHT lung which could represent infection or less likely asymmetric edema. No pneumothorax. Musculoskeletal: Essentially nondisplaced mid sternal fracture sagittal image 81. Fractures of the anterior RIGHT third and fifth ribs. CT ABDOMEN PELVIS FINDINGS Hepatobiliary: Gallbladder surgically absent. Mild central intrahepatic and extrahepatic biliary dilatation little changed. No focal liver lesions. Pancreas: Normal appearance Spleen: Normal appearance Adrenals/Urinary Tract: Adrenal glands normal appearance. Foley catheter decompresses urinary bladder. Kidneys in ureters normal appearance. Stomach/Bowel: Diverticulosis changes at the sigmoid colon with decreased surrounding inflammation versus prior study. Abscess collection seen on the previous exam no longer identified, pigtail drainage catheter at site. Normal appendix. Stomach  and bowel loops otherwise normal appearance. Vascular/Lymphatic: Atherosclerotic calcification aorta without aneurysm. No significant adenopathy. Reproductive: Uterus surgically absent. Other: Small amounts of free pelvic fluid and perihepatic/perisplenic fluid without free air. Small umbilical hernia containing fat. Musculoskeletal: No acute osseous findings. 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 Additional RIGHT upper lobe and scattered RIGHT lung infiltrates question pneumonia versus less likely asymmetric edema. Small BILATERAL pleural effusions RIGHT greater than LEFT with minimal adjacent compressive  atelectasis of the lower lobes. Improved diverticulitis changes with resolution of the previously identified abscess collection adjacent to the sigmoid colon. Fractures of the sternum and the anterior RIGHT third and fifth ribs suspect related to CPR. Small pericardial fusion. Electronically Signed   By: Lavonia Dana M.D.   On: 09/14/2016 13:01   Ct Abdomen Pelvis W Contrast  Result Date: 09/14/2016 CLINICAL DATA:  Diverticulitis post abscess drainage and surgery, complicated by a supraventricular tachycardia then cardiac arrest post CPR and defibrillation, intubation, hypertensive, history COPD, coronary disease, diabetes mellitus, hypertension, non-small-cell lung cancer EXAM: CT CHEST, ABDOMEN, AND PELVIS WITH CONTRAST TECHNIQUE: Multidetector CT imaging of the chest, abdomen and pelvis was performed following the standard protocol during bolus administration of intravenous contrast. Sagittal and coronal MPR images reconstructed from axial data set. CONTRAST:  Dilute oral contrast.  100 cc Isovue 300 IV. COMPARISON:  CT chest 06/14/2016, CT abdomen and pelvis 09/11/2016 FINDINGS: CT CHEST FINDINGS Cardiovascular: Atherosclerotic calcifications aorta and coronary arteries. Aorta normal caliber. Thoracic vascular structures grossly patent on nondedicated exam. Small pericardial fusion. Mediastinum/Nodes: Nasogastric tube traverses esophagus into stomach. Tip of endotracheal tube above carina. Small dense calcification within RIGHT thyroid lobe is unchanged. Base of cervical region otherwise unremarkable. Palpable with soft tissue density at the RIGHT hilum and paramediastinal RIGHT upper lobe, patient with known radiation fibrosis. Lungs/Pleura: Small BILATERAL pleural effusions larger on RIGHT. In addition to the previously identified radiation fibrosis changes in the RIGHT upper lobe, additional RIGHT upper lobe consolidation is seen question pneumonia. Compressive atelectasis in the lower lobes. Additional  scattered airspace infiltrates primarily in RIGHT lung which could represent infection or less likely asymmetric edema. No pneumothorax. Musculoskeletal: Essentially nondisplaced mid sternal fracture sagittal image 81. Fractures of the anterior RIGHT third and fifth ribs. CT ABDOMEN PELVIS FINDINGS Hepatobiliary: Gallbladder surgically absent. Mild central intrahepatic and extrahepatic biliary dilatation little changed. No focal liver lesions. Pancreas: Normal appearance Spleen: Normal appearance Adrenals/Urinary Tract: Adrenal glands normal appearance. Foley catheter decompresses urinary bladder. Kidneys in ureters normal appearance. Stomach/Bowel: Diverticulosis changes at the sigmoid colon with decreased surrounding inflammation versus prior study. Abscess collection seen on the previous exam no longer identified, pigtail drainage catheter at site. Normal appendix. Stomach and bowel loops otherwise normal appearance. Vascular/Lymphatic: Atherosclerotic calcification aorta without aneurysm. No significant adenopathy. Reproductive: Uterus surgically absent. Other: Small amounts of free pelvic fluid and perihepatic/perisplenic fluid without free air. Small umbilical hernia containing fat. Musculoskeletal: No acute osseous findings. 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 Additional RIGHT upper lobe and scattered RIGHT lung infiltrates question pneumonia versus less likely asymmetric edema. Small BILATERAL pleural effusions RIGHT greater than LEFT with minimal adjacent compressive atelectasis of the lower lobes. Improved diverticulitis changes with resolution of the previously identified abscess collection adjacent to the sigmoid colon. Fractures of the sternum and the anterior RIGHT third and fifth ribs suspect related to CPR. Small pericardial fusion. Electronically Signed   By: Lavonia Dana  M.D.   On: 09/14/2016 13:01   Dg Chest Port 1  View  Result Date: 09/14/2016 CLINICAL DATA:  Central line insertion. EXAM: PORTABLE CHEST 1 VIEW 6:36 p.m. COMPARISON:  09/14/2016 8:40 a.m. FINDINGS: New right central line is been inserted and the tip appears in excellent position. Left central line, endotracheal tube, and NG tube appear in good position, unchanged. Right pleural effusion caps the right apex, unchanged. No pneumothorax. Scarring at the superior aspect of the right hilum, obscured by the ventilation apparatus. IMPRESSION: New right-sided central line appears in good position. No acute changes. Electronically Signed   By: Lorriane Shire M.D.   On: 09/14/2016 19:01   Dg Chest Port 1 View  Result Date: 09/14/2016 CLINICAL DATA:  Central line placement EXAM: PORTABLE CHEST 1 VIEW COMPARISON:  09/14/2016 FINDINGS: Cardiomediastinal silhouette is stable. Stable endotracheal tube position. NG tube in place. There is persistent consolidation in right upper lobe. Left IJ central line with tip in distal SVC. No pneumothorax. IMPRESSION: Stable support apparatus. Left IJ central line in place. No pneumothorax. Electronically Signed   By: [name] M.D.   On: 09/14/2016 09:09   Dg Chest Port 1 View  Result Date: 09/14/2016 CLINICAL DATA:  Hypoxia EXAM: PORTABLE CHEST 1 VIEW COMPARISON:  September 12, 2016 chest radiograph and chest CT June 14, 2016 FINDINGS: Endotracheal tube tip is 3.1 cm above the carina. No pneumothorax. There is persistent airspace consolidation throughout much of the right upper lobe. There is suspected residual mass in this area medially. The lungs elsewhere are clear. Heart size and pulmonary vascularity are normal. No adenopathy. There is atherosclerotic calcification in the aorta. No bone lesions. Incidental note is made of small cervical ribs bilaterally. IMPRESSION: Endotracheal tube as described without pneumothorax. There is airspace consolidation throughout much the right upper lobe. There is a nodular area in the  medial aspect of the right upper lobe measuring 2.3 x 2.0 cm, concerning for residual mass in this area. This finding may warrant correlation with contrast enhanced chest CT to further assess. 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 Lungs elsewhere clear. Stable cardiac silhouette. There is aortic atherosclerosis. Electronically Signed   By: Lowella Grip III M.D.   On: 09/14/2016 08:06    Cardiac Studies    Echo 09/14/2016: Technically difficult study. EF 30-35% with moderately reduced EF. Mid-distal anteroseptal akinesis.  Cath 09/14/2016: Severe LV dysfunction (EF roughly 25%) with moderately-severely elevated LVEDP (25 mmHg). Mild nonobstructive CAD with 85% smooth narrowing of L2 small-caliber D1. -- Suggestive of Takotsubo Cardiomyopathy.  Patient Profile     Ms. Marlaine Arey is a 70 year old female with a past medical history significant for nonobstructive CAD (cath 2006 and again in 2013 with normal LV function, mild, non-obstructive CAD noted), HTN, DM 2, HLD, COPD, former smoker, Hx of non-small cell lung cater s/p chemo and radiation, hx of diverticulitis who initially presented on 11/19 with LLQ pain and found to have diverticulitis with abscess formation. Drain placed. 11/22 am when into cardiac arrest - Torsades [place] Pointes - VF-VT. Intubated and transferred to the ICU.   Review of tele shows that she initially went into Torsades de pointes, quickly went into V.Fib to V.Tach.  Had ROSC in 6-8 minutes. Curiously Mag was 2.1 following the code.  Initial trop 0.12. EKG with no acute ST changes. Nonspecific IVCD new since last tracing. ECHO with EF 30-35% and mid-distal anteroseptal akinesis.  No RV strain noted. Previously no know history of LV dysfunction.   CT scan of the abdomen done yesterday showed improving clinical findings. Cardiac catheterization yesterday showed nonobstructive coronary disease but with likely  Takotsubo cardiac myopathy.  Assessment & Plan     Cardiac Arrest from Torsade De Pointes/Polymorphic VT - not exactly sure the etiology. Much lites were relatively stable. This is questioned for this been related to Takotsubo cardiomyopathy induced event versus prolonged QT as seen on EKG.  For now, she remains on low-dose Lopressor, I would like to start low-dose beta blocker of roughly 2.5 mg every 6 hours IV as she is not yet taking by mouth.  I will ask Electrophysiology to Consult (will probably be tomorrow) - question if there could be medication interaction with her antibiotics leading to QT prolongation. (Pharmacy reviewing)  Unfortunately, it's clear that she probably had some fractured ribs. Pain control per [facility]   Likely Takotsubo Cardiomyopathy - slightly etiology stress recurrent abdominal abscess. Unclear the cardiac myopathy is the cause of her cardiac arrest or a result of the arrest.   Now, initiate low-dose beta blocker. As pressures tolerated would consider ARB  Low-dose diuretic based on her elevated LVEDP and +7 L over the last 2 days  Will defer management of her ventilator and infectious issues to critical care and surgery. I discussed with the surgical team and it appears that her abdominal process has definitely improved. -- Would recommend weaning off of Neo-Synephrine as this is adding afterload for her poor ventricle.  We'll continue to follow, will also ensure that physiology sees her tomorrow to consider the possibility of drug interaction related to arrhythmia versus a primary event from her Takotsubo cardiomyopathy. Would consider the possibility of LifeVest for discharge.  Signed, Glenetta Hew, MD  09/15/2016, 10:52 AM

## 2016-09-15 NOTE — Procedures (Signed)
Extubation Procedure Note  Patient Details:   Name: [name] DOB: [birth year] MRN: [id number]   Airway Documentation:     Evaluation  O2 sats: stable throughout Complications: No apparent complications Patient did tolerate procedure well. Bilateral Breath Sounds: Rhonchi   Yes   Patient extubated to [place]. Vital signs stable throughout. No complications. Patient tolerating well at this time. RT will continue to monitor.  Mcneil Sober 09/15/2016, 3:32 PM

## 2016-09-15 NOTE — Progress Notes (Signed)
PULMONARY / CRITICAL CARE MEDICINE   Name: [name] MRN: [id number] DOB: [birth year]    ADMISSION DATE:  09/11/2016 CONSULTATION DATE:  11/22  REFERRING MD:  Sheliah Plane (Feliz[place]ortiz)   CHIEF COMPLAINT:  PEA arrest   HISTORY OF PRESENT ILLNESS:  70 y.o. female with hx diverticulitis, CAD, COPD, DM, HTN, hx NSCLC in 2012 initially presented 11/19 with abd pain.  Initial w/u revealed diverticulitis with abscess.  She was seen by surgery, started on zosyn and had IR drain placed 11/20.  She was improving slowly until 11/22 when she was found to be in SVT 190's, then subsequently had PEA arrest with total 6 mins CPR, Defib x 2 for VFib. Intubated during code, awake post arrest but remained hypotensive.  PCCM consulted for ICU tx.   SUBJECTIVE: No acute issues overnight. Patient denies any difficulty breathing on pressure support 5/5. Family at bedside.  REVIEW OF SYSTEMS:  Unable to obtain with patient intubated.  VITAL SIGNS: BP (!) 152/71   Pulse 71   Temp 97.9 F (36.6 C) (Oral)   Resp 14   Ht '5\' 2"'$  (1.575 m)   Wt 194 lb 7.1 oz (88.2 kg)   SpO2 99%   BMI 35.56 kg/m   HEMODYNAMICS: CVP:  [12 mmHg-21 mmHg] 12 mmHg  VENTILATOR SETTINGS: Vent Mode: CPAP;PSV FiO2 (%):  [40 %] 40 % Set Rate:  [20 bmp] 20 bmp Vt Set:  [400 mL] 400 mL PEEP:  [5 cmH20] 5 cmH20 Pressure Support:  [5 cmH20] 5 cmH20 Plateau Pressure:  [18 cmH20-25 cmH20] 23 cmH20  INTAKE / OUTPUT: I/O last 3 completed shifts: In: 7190.3 [I.V.:6272.3; Other:8; IV NATFTDDUK:025] Out: 1440 [Urine:1001; Emesis/NG output:405; Drains:28; Stool:6]  PHYSICAL EXAMINATION: General:  Awake. Alert. No acute distress. Family at bedside.  Integument:  Warm & dry. No rash on exposed skin.  HEENT:  Moist mucus membranes. No scleral injection or icterus. Endotracheal tube in place. Cardiovascular:  Regular rate. No edema.  Pulmonary:  Slightly diminished breath sounds at the bases. Otherwise good aeration bilaterally. Symmetric  chest wall expansion. Abdomen: Soft. Normal bowel sounds. Nondistended. Grossly nontender. Neurological: Nodding head appropriately. Following commands. Attends to voice.   LABS:  BMET  Recent Labs Lab 09/13/16 0513 09/14/16 0716 09/14/16 0747 09/14/16 2020 09/15/16 0450  NA 139 143 144  --  137  K 3.9 3.3* 3.3*  --  3.8  CL 114* 111 108  --  109  CO2 20* 19*  --   --  22  BUN '10 8 8  '$ --  11  CREATININE 0.66 1.00 0.80 1.16* 1.09*  GLUCOSE 164* 228* 216*  --  149*    Electrolytes  Recent Labs Lab 09/13/16 0513 09/14/16 0716 09/15/16 0450  CALCIUM 8.2* 9.4 8.2*  MG  --  2.1 1.8    CBC  Recent Labs Lab 09/14/16 0716 09/14/16 0747 09/14/16 2020 09/15/16 0450  WBC 14.3*  --  12.0* 11.9*  HGB 11.2* 11.2* 10.8* 10.6*  HCT 34.9* 33.0* 33.9* 33.1*  PLT 246  --  247 219    Coag's  Recent Labs Lab 09/12/16 0422 09/15/16 0450  APTT  --  26  INR 1.34 1.29    Sepsis Markers  Recent Labs Lab 09/11/16 1045 09/14/16 0716 09/14/16 1040 09/15/16 0450  LATICACIDVEN 1.71 5.6*  --   --   PROCALCITON  --   --  0.48 0.62    ABG  Recent Labs Lab 09/14/16 0930 09/15/16 0453  PHART 7.261* 7.307*  PCO2ART 51.5* 44.6  PO2ART 337* 70.0*    Liver Enzymes  Recent Labs Lab 09/11/16 1030 09/12/16 0422  AST 17 14*  ALT 16 13*  ALKPHOS 86 67  BILITOT 0.5 0.7  ALBUMIN 3.5 2.6*    Cardiac Enzymes  Recent Labs Lab 09/14/16 0716 09/14/16 2020  TROPONINI 0.12* 0.52*    Glucose  Recent Labs Lab 09/14/16 1137 09/14/16 1723 09/14/16 2012 09/15/16 0033 09/15/16 0426 09/15/16 1212  GLUCAP 204* 179* 191* 171* 136* 151*    Imaging Dg Chest Port 1 View  Result Date: 09/14/2016 CLINICAL DATA:  Central line insertion. EXAM: PORTABLE CHEST 1 VIEW 6:36 p.m. COMPARISON:  09/14/2016 8:40 a.m. FINDINGS: New right central line is been inserted and the tip appears in excellent position. Left central line, endotracheal tube, and NG tube appear in good  position, unchanged. Right pleural effusion caps the right apex, unchanged. No pneumothorax. Scarring at the superior aspect of the right hilum, obscured by the ventilation apparatus. IMPRESSION: New right-sided central line appears in good position. No acute changes. Electronically Signed   By: Lorriane Shire M.D.   On: 09/14/2016 19:01     STUDIES:  CT abd/pelvis 11/19: Focal diverticulitis of the mid sigmoid colon with adjacent 2.5 x 4.1 cm abscess and foci of pneumoperitoneum in this area. Small amount of complex free fluid within the pelvis. CT chest/abd/pelvis 11/22: Radiation fibrosis changes in medial RIGHT upper lobe extending to the superior RIGHT hilum. Additional RIGHT upper lobe and scattered RIGHT lung infiltrates question pneumonia versus less likely asymmetric edema. Small BILATERAL pleural effusions RIGHT greater than LEFT with minimal adjacent compressive atelectasis of the lower lobes. Improved diverticulitis changes with resolution of the previously identified abscess collection adjacent to the sigmoid colon. Fractures of the sternum and the anterior RIGHT third and fifth ribs suspect related to CPR. Small pericardial fusion. TTE 11/22:  LV EF 30-35% w/ akinesis of mid-apicalanteroseptalmycoardium. Grade 1 diastolic dysfunciton. LA & RA normal in size. RV normal in size & function. Trivial aortic regurg w/o stenosis. Trivial mitral regurg. No pulmonic regurg. Mild tricuspid regurg. Trivial pericardial effusion.  Cath 09/14/2016: Severe LV dysfunction (EF roughly 25%) with moderately-severely elevated LVEDP (25 mmHg). Mild nonobstructive CAD with 85% smooth narrowing of L2 small-caliber D1. -- Suggestive of Takotsubo Cardiomyopathy.  MICROBIOLOGY: Blood Ctx x2 11/19 >> Urine Ctx 11/19:  Multiple species present Diverticular Abscess Ctx 11/20:  Klebsiella pneumoniae (resistant Ampicillin) MRSA PCR 11/22:  Negative Blood Ctx x2 11/22 >>  ANTIBIOTICS: Zosyn 11/19 >> Vancomycin  11/22 >> Eraxis 11/22 >>  SIGNIFICANT EVENTS: 11/22 - PEA rrest that was brief w/ 6 mins CPR  LINES/TUBES: OETT 7.5 11/22 >> R FEM ART LINE 11/22 >> R IJ CVL 11/22 >> OGT 11/22 >> FOLEY 11/22 >>    ASSESSMENT / PLAN:  PULMONARY A: Acute Hypoxic Respiratory Failure - Post cardiac arrest. H/O SCLC - 2012 tx w/ Chemo & XRT. H/O COPD  P:   Full Vent Support Xopenex neb prn  CARDIOVASCULAR A: Shock - Resolved. Likely cardiogenic.  PEA Arrest - S/P 6 minutes of CPR. Nonocculsive CAD on LHC. Prolonged QTc - Possibly secondary to Eraxis. SVT/Torsades Takotsubo Cardiomyopathy H/O HTN & Hyperlipidemia  P:  Continuous telemetry monitoring Vitals per unit protocol Cardiology Following - Appreciate recommendations. Goal MAP >65 Continuing ASA '81mg'$   Starting Lopressor 2.'5mg'$  IV q6hr per Cardiology Recs Holding on diuresis for now  RENAL A: Acute Renal Failure - Mild. Hypokalemia - Resolved. Lactic Acidosis AGMA - Resolved.  P:   Monitoring UOP with Foley Repeat LA now Trending renal function & electrolytes daily Replacing electrolytes as indicated  GASTROINTESTINAL A: Sigmoid Diverticulitis w/ Abscess - S/P IR drain.  P:   NPO Pepcid IV q24hr Holding on Tube Feedings  HEMATOLOGIC A: Leukocytosis - Improving. Likely demargination from stress. Anemia - Mild. No signs of active bleeding.  P:  Trending cell counts daily w/ CBC SCDs Heparin [place] q8hr  INFECTIOUS A: Diverticular Abscess - Resolved on repeat CT w/ drain in place.  P:   Continuing Empiric Vancomycin & Zosyn Day #2 Discontinuing Eraxis Trending Procalcitonin per algorithm Awaiting finalization of repeat cultures  ENDOCRINE A: H/O DM Possible Adrenal Insufficiency - On Prednisone for 5 days pre-admit.  P:   Discontinuing Solu-Cortef  Continuing Accu-Checks q4hr SSI per Resistant Scale  NEUROLOGIC A: Sedation on Ventilator Pain Control - Rib fractures.  P:   RASS goal:  0 to  -1 Fentanyl gtt & IV prn Pain Versed IV prn sedation   FAMILY  - Updates:  Family updated at bedside 11/23 by Dr. Ashok Cordia.   - Inter-disciplinary family meet or Palliative Care meeting due by:  11/29  TODAY'S SUMMARY:  70 y.o. female admitted 11/19 with diverticulitis with abscess s/p IR drain.  On 11/22 had brief PEA arrest, shock, ICU tx. patient found to have considerable cardiomyopathy. No evidence of worsening of intra-abdominal abscess or infection. Successfully weaned off vasopressor support. Continuing pressure support weaning with pressure support 0/5 and FiO2 0.4 for at least 30 minutes to determine her ability to extubate.  I have spent a total of 42 minutes of critical care time today caring for the patient, updating family at bedside, and reviewing the patient's electronic medical record.  Sonia Baller Ashok Cordia, M.D. [facility] Pulmonary & Critical Care Pager:  [phone] After 3pm or if no response, call [phone] 09/15/2016 1:53 PM

## 2016-09-15 NOTE — Progress Notes (Signed)
[place] Progress Note Patient Name: [name] DOB: [birth year] MRN: [id number]   Date of Service  09/15/2016  HPI/Events of Note  C/o pain in abscess area  eICU Interventions  fent prn     Intervention Category Intermediate Interventions: Pain - evaluation and management  Jalaila Caradonna 09/15/2016, 8:00 PM

## 2016-09-15 NOTE — Progress Notes (Signed)
PCCM Attending SBT Note: Patient seen at bedside after 35 minutes on spontaneous breathing trial with pressure support 0/0 and FiO2 0.4. Respiratory rate 20. Vitals otherwise stable. Saturation 99%. Tidal volume greater than 900 mL upon request. Spoke with respiratory therapist. Checking for cuff leak and if present plan to extubate patient to nasal cannula. Pulmonary toilet with incentive spirometry.  Sonia Baller Ashok Cordia, M.D. [facility] Pulmonary & Critical Care Pager:  [phone] After 3pm or if no response, call [phone] 3:26 PM 09/15/16

## 2016-09-16 ENCOUNTER — Other Ambulatory Visit: Payer: Self-pay | Admitting: *Deleted

## 2016-09-16 ENCOUNTER — Encounter (HOSPITAL_COMMUNITY): Payer: Self-pay | Admitting: Cardiovascular Disease

## 2016-09-16 DIAGNOSIS — J438 Other emphysema: Secondary | ICD-10-CM

## 2016-09-16 LAB — MAGNESIUM: MAGNESIUM: 1.7 mg/dL (ref 1.7–2.4)

## 2016-09-16 LAB — GLUCOSE, CAPILLARY
GLUCOSE-CAPILLARY: 100 mg/dL — AB (ref 65–99)
GLUCOSE-CAPILLARY: 119 mg/dL — AB (ref 65–99)
GLUCOSE-CAPILLARY: 163 mg/dL — AB (ref 65–99)
Glucose-Capillary: 101 mg/dL — ABNORMAL HIGH (ref 65–99)
Glucose-Capillary: 131 mg/dL — ABNORMAL HIGH (ref 65–99)

## 2016-09-16 LAB — CBC WITH DIFFERENTIAL/PLATELET
BASOS ABS: 0 10*3/uL (ref 0.0–0.1)
BASOS PCT: 0 %
Eosinophils Absolute: 0.2 10*3/uL (ref 0.0–0.7)
Eosinophils Relative: 2 %
HEMATOCRIT: 29.9 % — AB (ref 36.0–46.0)
HEMOGLOBIN: 9.9 g/dL — AB (ref 12.0–15.0)
LYMPHS PCT: 14 %
Lymphs Abs: 1.3 10*3/uL (ref 0.7–4.0)
MCH: 29.8 pg (ref 26.0–34.0)
MCHC: 33.1 g/dL (ref 30.0–36.0)
MCV: 90.1 fL (ref 78.0–100.0)
Monocytes Absolute: 0.9 10*3/uL (ref 0.1–1.0)
Monocytes Relative: 10 %
NEUTROS ABS: 7 10*3/uL (ref 1.7–7.7)
NEUTROS PCT: 74 %
Platelets: 153 10*3/uL (ref 150–400)
RBC: 3.32 MIL/uL — AB (ref 3.87–5.11)
RDW: 15 % (ref 11.5–15.5)
WBC: 9.4 10*3/uL (ref 4.0–10.5)

## 2016-09-16 LAB — CULTURE, BLOOD (ROUTINE X 2)
Culture: NO GROWTH
Culture: NO GROWTH

## 2016-09-16 LAB — RENAL FUNCTION PANEL
ALBUMIN: 2.3 g/dL — AB (ref 3.5–5.0)
ANION GAP: 8 (ref 5–15)
BUN: 11 mg/dL (ref 6–20)
CHLORIDE: 112 mmol/L — AB (ref 101–111)
CO2: 22 mmol/L (ref 22–32)
Calcium: 8.2 mg/dL — ABNORMAL LOW (ref 8.9–10.3)
Creatinine, Ser: 1.07 mg/dL — ABNORMAL HIGH (ref 0.44–1.00)
GFR calc Af Amer: >60 mL/min (ref >60)
GFR, EST NON AFRICAN AMERICAN: 52 mL/min — AB (ref >60)
GLUCOSE: 107 mg/dL — AB (ref 65–99)
PHOSPHORUS: 3.1 mg/dL (ref 2.5–4.6)
POTASSIUM: 3.5 mmol/L (ref 3.5–5.1)
Sodium: 142 mmol/L (ref 135–145)

## 2016-09-16 LAB — PROCALCITONIN: Procalcitonin: 0.29 ng/mL

## 2016-09-16 LAB — VANCOMYCIN, TROUGH: Vancomycin Tr: 22 ug/mL (ref 15–20)

## 2016-09-16 MED ORDER — INSULIN ASPART 100 UNIT/ML ~~LOC~~ SOLN
0.0000 [IU] | Freq: Every day | SUBCUTANEOUS | Status: DC
  Administered 2016-09-24: 2 [IU] via SUBCUTANEOUS

## 2016-09-16 MED ORDER — HYDROCODONE-ACETAMINOPHEN 5-325 MG PO TABS
1.0000 | ORAL_TABLET | Freq: Four times a day (QID) | ORAL | Status: DC | PRN
  Administered 2016-09-16 – 2016-09-26 (×22): 2 via ORAL
  Administered 2016-09-27: 1 via ORAL
  Filled 2016-09-16 (×10): qty 2
  Filled 2016-09-16: qty 1
  Filled 2016-09-16 (×11): qty 2

## 2016-09-16 MED ORDER — INSULIN ASPART 100 UNIT/ML ~~LOC~~ SOLN
0.0000 [IU] | Freq: Three times a day (TID) | SUBCUTANEOUS | Status: DC
  Administered 2016-09-16: 2 [IU] via SUBCUTANEOUS
  Administered 2016-09-16 – 2016-09-17 (×2): 3 [IU] via SUBCUTANEOUS
  Administered 2016-09-17: 5 [IU] via SUBCUTANEOUS
  Administered 2016-09-17 – 2016-09-18 (×3): 2 [IU] via SUBCUTANEOUS
  Administered 2016-09-18 – 2016-09-19 (×3): 3 [IU] via SUBCUTANEOUS
  Administered 2016-09-19: 2 [IU] via SUBCUTANEOUS
  Administered 2016-09-20: 5 [IU] via SUBCUTANEOUS
  Administered 2016-09-20 – 2016-09-21 (×2): 2 [IU] via SUBCUTANEOUS
  Administered 2016-09-22: 3 [IU] via SUBCUTANEOUS
  Administered 2016-09-22: 8 [IU] via SUBCUTANEOUS
  Administered 2016-09-23: 3 [IU] via SUBCUTANEOUS
  Administered 2016-09-23 (×2): 8 [IU] via SUBCUTANEOUS
  Administered 2016-09-24 (×3): 3 [IU] via SUBCUTANEOUS
  Administered 2016-09-25: 2 [IU] via SUBCUTANEOUS
  Administered 2016-09-25 (×2): 3 [IU] via SUBCUTANEOUS
  Administered 2016-09-26: 2 [IU] via SUBCUTANEOUS
  Administered 2016-09-26 – 2016-09-27 (×2): 3 [IU] via SUBCUTANEOUS
  Administered 2016-09-27: 11 [IU] via SUBCUTANEOUS

## 2016-09-16 MED ORDER — FUROSEMIDE 10 MG/ML IJ SOLN
40.0000 mg | Freq: Two times a day (BID) | INTRAMUSCULAR | Status: AC
  Administered 2016-09-16 – 2016-09-17 (×3): 40 mg via INTRAVENOUS
  Filled 2016-09-16 (×3): qty 4

## 2016-09-16 MED ORDER — ORAL CARE MOUTH RINSE
15.0000 mL | Freq: Two times a day (BID) | OROMUCOSAL | Status: DC
  Administered 2016-09-17 – 2016-09-26 (×14): 15 mL via OROMUCOSAL

## 2016-09-16 MED ORDER — MAGNESIUM SULFATE 2 GM/50ML IV SOLN
2.0000 g | Freq: Once | INTRAVENOUS | Status: AC
  Administered 2016-09-16: 2 g via INTRAVENOUS
  Filled 2016-09-16: qty 50

## 2016-09-16 MED ORDER — ATROPINE SULFATE 1 MG/10ML IJ SOSY
PREFILLED_SYRINGE | INTRAMUSCULAR | Status: AC
  Filled 2016-09-16: qty 10

## 2016-09-16 MED ORDER — SODIUM CHLORIDE 0.9 % IV SOLN
30.0000 meq | Freq: Once | INTRAVENOUS | Status: AC
  Administered 2016-09-16: 30 meq via INTRAVENOUS
  Filled 2016-09-16: qty 15

## 2016-09-16 NOTE — Consult Note (Signed)
ELECTROPHYSIOLOGY CONSULT NOTE    Patient ID: [name] MRN: [id number], DOB/AGE: 1946-06-06 70 y.o.  Admit date: 09/11/2016 Date of Consult: 09/16/2016  Primary Physician: [name], MD Primary Cardiologist: New to Dr. Ellyn Hack (historicaly Dr. Lia Foyer, never out patient) Requesting MD: Dr. Ellyn Hack  Reason for Consultation: cardiac arrest  HPI: [name] is a 70 y.o. female with PMHx of NOD by cath 2006, 2013 and at that time normal LV function, HTN, DM, HLD, COPD and a former smoker with hx of non-small cell lung cater s/p chemo and radiation was initially admitted to [facility] 09/11/16 with abdominal c/o found with diverticulitis in the mid sigmoid colon with abscess formation and a small fossae of pneumoperitoneum in the area. She was started on Zosyn at that time. Underwent IR guided drainage with drain placed on 11/20 though c/w ongoing uncontrolled abd pain.  On 11/22 am, cardiac arrest with 6 minutes of CPR before attaining ROSC. Reportedly went into PEA arrest with subsequent V. Fib. Debrillation x 2 for V.Fib. She was intubated during the code. Awake post arrest but remained hypotensive; started on phenylephrine gtt. She was brought to the cath lab findings:  There is severe left ventricular systolic dysfunction.  LV end diastolic pressure is moderately elevated.  There is trivial (1+) mitral regurgitation.  Ost Cx to Prox Cx lesion, 10 %stenosed.  1st Diag lesion, 25 %stenosed. Mild nonobstructive CAD with 85% smooth narrowing in the first diagonal branch of the LAD; 10% smooth narrowing of the ostial proximal left circumflex coronary artery; and a normal dominant RCA. Severe LV dysfunction with more pronounced mid, distal anterolateral, apical and distal inferior hypocontractility suggestive of a Takotsubo cardiomyopathy pattern with an ejection fraction of 20-25%. RECOMMENDATION: Medical therapy.  With the patient's episode of torsade du p[ointes avoid  medications with potential for QTC prolongation.  09/14/16 (post arrest) TTE Study Conclusions - Left ventricle: The cavity size was normal. Wall thickness was   normal. Systolic function was moderately to severely reduced. The   estimated ejection fraction was in the range of 30% to 35%.   Akinesis of the mid-apicalanteroseptal myocardium. Doppler   parameters are consistent with abnormal left ventricular   relaxation (grade 1 diastolic dysfunction). - Aortic valve: Mildly calcified annulus. There was trivial   regurgitation. - Pulmonary arteries: Systolic pressure was mildly increased. PA   peak pressure: 38 mm Hg (S). - Pericardium, extracardiac: A trivial pericardial effusion was   identified. Impressions: - Technically difficult images   The LV function is moderately reduced with EF of 30-35%.   There is mid - distal anteroseptal akinesis  09/14/16 Labs (post arrest) K+ 3.3 BUN/Creat 8/1.00 Lactic acid 5.6 H/H 11/34 WBC 14.3 plts 246  The patient states she hurts all over, clutches a pillow to her abdomen wincing in pain. Denies hx of syncope or sudden or unexplained deaths in the family.  Past Medical History:  Diagnosis Date  . Age-related nuclear cataract of both eyes 2016   +cortical age related cataracts OU   . Allergy    seasonal  . Arthritis    hands  . Bilateral diabetic retinopathy (HCC) 2015   Dr. Zigmund Daniel  . Blood transfusion without reported diagnosis    when taking chemo  . CAD (coronary artery disease)    NON obstructive. Cath 2006 preserved LV fxn, scattered irregularities without critical stenosis. 2008 stress echo negative for ischemia, but with hypertensive response  . COPD (chronic obstructive pulmonary disease) ([place])   .  Diabetes mellitus with complication (HCC)    diab retinopathy OU (laser)  . Diverticulitis 05/2016  . GERD (gastroesophageal reflux disease)    protonix  . History of diverticulitis of colon   . Hyperlipidemia   .  Hypertension   . Lung cancer ([place])    non-small cell lung ca, stage III in 05/2011; systemic chemotherapy concurrent with radiation followed by prophylactic cranial irradiation and has been observation since July of 2010 with no evidence for disease recurrence-released from onc f/u 12/2014 (needs annual cxr by PCP).  CXR 08/2015 stable.  . Mild cognitive impairment with memory loss    Likely from brain radiation therapy  . Open-angle glaucoma 2016   Dr. Shirley Muscat, (bilateral)---responding to topical therapy  . Osteopenia 03/2016   03/2016 DEXA T-score -2.2     Surgical History:  Past Surgical History:  Procedure Laterality Date  . ABDOMINAL HYSTERECTOMY  1997  . APPENDECTOMY    . CARDIAC CATHETERIZATION N/A 09/14/2016   Procedure: Left Heart Cath and Coronary Angiography;  Surgeon: Troy Sine, MD;  Location: [facility];  Service: Cardiovascular;  Laterality: N/A;  . CARPAL TUNNEL RELEASE    . CATARACT EXTRACTION    . CHOLECYSTECTOMY  2002  . COLONOSCOPY  10/24/00   normal.  [employer] hemoccult testing via [place] 06/18/15 was NEG  . LEFT HEART CATHETERIZATION WITH CORONARY ANGIOGRAM N/A 05/30/2012   Procedure: LEFT HEART CATHETERIZATION WITH CORONARY ANGIOGRAM;  Surgeon: Burnell Blanks, MD;  Location: [facility];  Service: Cardiovascular;  Laterality: N/A;     Prescriptions Prior to Admission  Medication Sig Dispense Refill Last Dose  . acetaminophen (TYLENOL) 500 MG tablet Take 1,000 mg by mouth every 6 (six) hours as needed for headache (pain).   month ago  . albuterol (PROAIR HFA) 108 (90 Base) MCG/ACT inhaler Inhale 2 puffs into the lungs every 6 (six) hours as needed for wheezing or shortness of breath.   few days ago  . albuterol (PROVENTIL) (2.5 MG/3ML) 0.083% nebulizer solution USE ONE VIAL IN NEBULIZER EVERY 4 HOURS AS NEEDED FOR WHEEZING OR SHORTNESS OF BREATH 75 mL 3 few days ago  . aspirin EC 81 MG tablet Take 81 mg by mouth at bedtime.   09/10/2016 at Unknown  time  . aspirin-acetaminophen-caffeine (EXCEDRIN MIGRAINE) 250-250-65 MG per tablet Take 1 tablet by mouth every 6 (six) hours as needed for headache.   few months ago  . atorvastatin (LIPITOR) 80 MG tablet TAKE ONE TABLET BY MOUTH ONCE DAILY (Patient taking differently: TAKE ONE TABLET BY MOUTH ONCE DAILY AT BEDTIME) 90 tablet 1 09/10/2016 at Unknown time  . Bromfenac Sodium (PROLENSA) 0.07 % SOLN Place 1 drop into the left eye at bedtime.   09/10/2016 at Unknown time  . doxycycline (VIBRA-TABS) 100 MG tablet Take 1 tablet (100 mg total) by mouth 2 (two) times daily. (Patient taking differently: Take 100 mg by mouth 2 (two) times daily. 10 day course started 09/07/16) 20 tablet 0 09/10/2016 at pm  . fluticasone (FLONASE) 50 MCG/ACT nasal spray Place 2 sprays into both nostrils at bedtime. 16 g 3 09/10/2016 at Unknown time  . Insulin Isophane & Regular Human (HUMULIN 70/30 KWIKPEN) (70-30) 100 UNIT/ML PEN Inject 16 units subcutaneously before breakfast and 20 units before supper (Patient taking differently: Inject 20-25 Units into the skin See admin instructions. Inject 20 units subcutaneously before breakfast and 25 units before supper) 15 mL 0 09/10/2016 at supper  . loratadine (CLARITIN) 10 MG tablet Take  10 mg by mouth at bedtime.    09/10/2016 at Unknown time  . metFORMIN (GLUCOPHAGE) 500 MG tablet TAKE ONE TABLET BY MOUTH TWICE DAILY WITH MEALS 180 tablet 1 09/10/2016 at supper  . nitroGLYCERIN (NITROSTAT) 0.4 MG SL tablet Place 1 tablet (0.4 mg total) under the tongue every 5 (five) minutes as needed. For chest pain (Patient taking differently: Place 0.4 mg under the tongue every 5 (five) minutes as needed for chest pain. ) 50 tablet 3 2-3 years ago  . pantoprazole (PROTONIX) 40 MG tablet TAKE ONE TABLET BY MOUTH ONCE DAILY (Patient taking differently: TAKE ONE TABLET BY MOUTH ONCE DAILY AT BEDTIME) 30 tablet 12 09/10/2016 at Unknown time  . prednisoLONE acetate (PRED FORTE) 1 % ophthalmic  suspension Place 1 drop into the left eye at bedtime.    09/10/2016 at pm  . predniSONE (DELTASONE) 20 MG tablet 2 tabs po qd x 5d (Patient taking differently: Take 40 mg by mouth daily with breakfast. 5 day course started 09/07/16) 10 tablet 0 09/10/2016 at Unknown time  . FLOVENT HFA 220 MCG/ACT inhaler INHALE TWO PUFFS BY MOUTH TWICE DAILY (Patient not taking: Reported on 09/11/2016) 12 g 6 Not Taking at Unknown time    Inpatient Medications:  . aspirin  81 mg Oral Daily  . chlorhexidine gluconate (MEDLINE KIT)  15 mL Mouth Rinse BID  . famotidine (PEPCID) IV  20 mg Intravenous Q24H  . heparin  5,000 Units Subcutaneous Q8H  . insulin aspart  0-20 Units Subcutaneous Q4H  . ketorolac  1 drop Left Eye QID  . mouth rinse  15 mL Mouth Rinse 10 times per day  . metoprolol  2.5 mg Intravenous Q6H  . piperacillin-tazobactam (ZOSYN)  IV  3.375 g Intravenous Q8H  . sodium chloride flush  3 mL Intravenous Q12H  . sodium chloride flush  3 mL Intravenous Q12H  . sodium chloride flush  3 mL Intravenous Q12H  . vancomycin  1,000 mg Intravenous Q12H    Allergies:  Allergies  Allergen Reactions  . Lactose Intolerance (Gi) Diarrhea  . Ibuprofen     REACTION: Anxiousness, hyperventilates    Social History   Social History  . Marital status: Married    Spouse name: N/A  . Number of children: N/A  . Years of education: N/A   Occupational History  . Retired Systems analyst Unemployed   Social History Main Topics  . Smoking status: Former Smoker    Packs/day: 1.00    Years: 45.00    Types: Cigarettes    Quit date: 10/24/2008  . Smokeless tobacco: Never Used  . Alcohol use No  . Drug use: No  . Sexual activity: Not on file   Other Topics Concern  . Not on file   Social History Narrative   Lives in [place], has husband and daughter and son. Daughter's family is living with her and husband at present. She is ambulatory daily without cane or walker.      Family History  Problem  Relation Age of Onset  . Heart failure Mother   . Kidney disease Mother     renal failure  . Diabetes Mother   . Diabetes Father   . Diabetes Brother   . Heart disease Brother   . Heart attack Brother   . Heart attack Brother   . Heart attack Brother   . Colon cancer Neg Hx      Review of Systems: All other systems reviewed and are otherwise negative except  as noted above.  Physical Exam: Vitals:   09/16/16 0600 09/16/16 0700 09/16/16 0800 09/16/16 0817  BP: 134/69 134/62 130/68 130/68  Pulse: (!) 54 79 79 94  Resp: (!) _0 (!) 23  Temp:    97.8 F (36.6 C)  TempSrc:    Oral  SpO2: 91% 100% 100%   Weight:      Height:       GEN- The patient is ill appearing, alert and oriented x 3 today.   HEENT: normocephalic, atraumatic; sclera clear, conjunctiva pink; hearing intact; neck supple, no JVP Lymph- no cervical lymphadenopathy Lungs- anterior only, scattered ronchi b/l, poor inspiratory effort Heart- Regular rate and rhythm, no murmurs, rubs or gallops GI- not examined, patient reports ongoing and unchanged pain, clutching pillow to her abdomen (already seen by surgery this AM) Extremities- no clubbing, cyanosis, or edema MS- no significant deformity or atrophy Skin- warm and dry, no rash or lesion Psych- euthymic mood, full affect Neuro- no gross deficits observed  Labs:   Lab Results  Component Value Date   WBC 9.4 09/16/2016   HGB 9.9 (L) 09/16/2016   HCT 29.9 (L) 09/16/2016   MCV 90.1 09/16/2016   PLT 153 09/16/2016    Recent Labs Lab 09/12/16 0422  09/16/16 0434  NA 139  < > 142  K 3.2*  < > 3.5  CL 111  < > 112*  CO2 23  < > 22  BUN 10  < > 11  CREATININE 0.70  < > 1.07*  CALCIUM 7.9*  < > 8.2*  PROT 5.2*  --   --   BILITOT 0.7  --   --   ALKPHOS 67  --   --   ALT 13*  --   --   AST 14*  --   --   GLUCOSE 112*  < > 107*  < > = values in this interval not displayed.    Radiology/Studies:   Dg Chest Port 1 View Result Date:  09/14/2016 CLINICAL DATA:  Hypoxia EXAM: PORTABLE CHEST 1 VIEW COMPARISON:  September 12, 2016 chest radiograph and chest CT June 14, 2016 FINDINGS: Endotracheal tube tip is 3.1 cm above the carina. No pneumothorax. There is persistent airspace consolidation throughout much of the right upper lobe. There is suspected residual mass in this area medially. The lungs elsewhere are clear. Heart size and pulmonary vascularity are normal. No adenopathy. There is atherosclerotic calcification in the aorta. No bone lesions. Incidental note is made of small cervical ribs bilaterally. IMPRESSION: Endotracheal tube as described without pneumothorax. There is airspace consolidation throughout much the right upper lobe. There is a nodular area in the medial aspect of the right upper lobe measuring 2.3 x 2.0 cm, concerning for residual mass in this area. This finding may warrant correlation with contrast enhanced chest CT to further assess. 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 Lungs elsewhere clear. Stable cardiac silhouette. There is aortic atherosclerosis. Electronically Signed   By: Lowella Grip III M.D.   On: 09/14/2016 08:06   Ct Image Guided Fluid Drain By Catheter Result Date: 09/12/2016 CLINICAL DATA:  Sigmoid diverticulitis with focal diverticular abscess. EXAM: CT GUIDED CATHETER DRAINAGE OF PERITONEAL ABSCESS ANESTHESIA/SEDATION: 1.5 mg IV Versed 50 mcg IV Fentanyl Total Moderate Sedation Time:  30 minutes The patient's level of consciousness and physiologic status were continuously monitored during the procedure by Radiology nursing. PROCEDURE: The procedure, risks, benefits,  and alternatives were explained to the patient. Questions regarding the procedure were encouraged and answered. The patient understands and consents to the procedure. A time out was performed prior to initiating the procedure. The anterior abdominal wall was prepped with  chlorhexidine in a sterile fashion, and a sterile drape was applied covering the operative field. A sterile gown and sterile gloves were used for the procedure. Local anesthesia was provided with 1% Lidocaine. CT was performed in a supine position through the pelvis. Under CT guidance, an 18 gauge trocar needle was advanced to the level of a sigmoid diverticular abscess. After advancing a needle into the abscess, a guidewire was advanced. The tract was dilated and a 10 French percutaneous drainage catheter advanced over the wire. Catheter position was confirmed by CT. A fluid sample was aspirated and sent for culture analysis. The catheter was connected to suction bulb drainage. It was secured at the skin with a Prolene retention suture and StatLock device. COMPLICATIONS: None FINDINGS: Anterior percutaneous window was present to the level of the diverticular abscess located medial to the sigmoid colon. Aspiration yielded bloody and purulent fluid. The drainage catheter was able to be formed in the abscess. IMPRESSION: CT-guided percutaneous drainage of diverticular peritoneal abscess located adjacent to the sigmoid colon. A sample of purulent fluid was sent for culture analysis. Electronically Signed   By: Aletta Edouard M.D.   On: 09/12/2016 16:45    EKG: post arrest is SR, QTc 430-477m By telemetry her QT just prior to VT is [id number] reviewed by myself: SR currently  Assessment and Plan:   1. Cardiac arrest 09/14/16, polymorphic VT     + CPR, + shock x1, intubated     Extubated yesterday, off pressor     TakoTsubo     Agree to avoid QT prolonging medicines     Recommend Life Vest upon discharge     Keep K+  =/> 4.0 . Mag  =/> 2.0     Afterload reduction and management with primary cardiology service  2. Diverticulitis, abscess     Drain in place     C/w pain  C/w CCU, surgery, primary team   Signed, ReTommye StandardPA-C 09/16/2016 9:01 AM  The patient is seen interviewed and  examined. No antecedent history of syncope. No family history of cardiac arrest. The patient had polymorphic ventricular tachycardia in the context of mild QT prolongation (460-80 ms not altogether different from her long-term chronic mild prolongation. There is no long short coupling with the initiation of her event. 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 She's had minimal troponin elevation consistent with the degree of wall motion abnormality supporting that diagnosis. Ventricular tachycardia has certainly been described in the context of Tako Tsubo and is felt to be of not long-term consequence. Hence, would recommend the use of a lifetest upon discharge. For right now, in the context of ischemia and her worsening T waves, would avoid QT prolonging drugs.  We will see her again as needed

## 2016-09-16 NOTE — Progress Notes (Signed)
Patient transferred from ICU to 4NP02. A&O, NAD, resting quietly in bed. Will continue to monitor.

## 2016-09-16 NOTE — Progress Notes (Signed)
2 Days Post-Op  Subjective: Patient extubated yesterday Complaining of lower abdominal pain No BM yesterday On clear liquids  Objective: Vital signs in last 24 hours: Temp:  [97.4 F (36.3 C)-97.9 F (36.6 C)] 97.8 F (36.6 C) (11/24 0817) Pulse Rate:  [54-111] 94 (11/24 0817) Resp:  [7-23] 23 (11/24 0817) BP: (112-152)/(49-74) 130/68 (11/24 0817) SpO2:  [91 %-100 %] 100 % (11/24 0800) Arterial Line BP: (140-194)/(62-99) 194/99 (11/24 0800) FiO2 (%):  [40 %] 40 % (11/23 1258) Weight:  [84.5 kg (186 lb 4.6 oz)] 84.5 kg (186 lb 4.6 oz) (11/24 0300) Last BM Date: 09/13/16  Intake/Output from previous day: 11/23 0701 - 11/24 0700 In: 3728.5 [I.V.:2583.5; IV Piggyback:1045] Out: 093 [Urine:915] Intake/Output this shift: No intake/output data recorded.  General appearance: alert, cooperative and no distress Resp: clear to auscultation bilaterally Cardio: regular rate and rhythm, S1, S2 normal, no murmur, click, rub or gallop GI: soft; + BS; tender in lower abdomen Drain - minimal purulent drainage in bulb  Lab Results:   Recent Labs  09/15/16 0450 09/16/16 0435  WBC 11.9* 9.4  HGB 10.6* 9.9*  HCT 33.1* 29.9*  PLT 219 153   BMET  Recent Labs  09/15/16 0450 09/16/16 0434  NA 137 142  K 3.8 3.5  CL 109 112*  CO2 22 22  GLUCOSE 149* 107*  BUN 11 11  CREATININE 1.09* 1.07*  CALCIUM 8.2* 8.2*   PT/INR  Recent Labs  09/15/16 0450  LABPROT 16.2*  INR 1.29   ABG  Recent Labs  09/14/16 0930 09/15/16 0453  PHART 7.261* 7.307*  HCO3 22.4 22.3    Studies/Results: Ct Chest W Contrast  Result Date: 09/14/2016 CLINICAL DATA:  Diverticulitis post abscess drainage and surgery, complicated by a supraventricular tachycardia then cardiac arrest post CPR and defibrillation, intubation, hypertensive, history COPD, coronary disease, diabetes mellitus, hypertension, non-small-cell lung cancer EXAM: CT CHEST, ABDOMEN, AND PELVIS WITH CONTRAST TECHNIQUE:  Multidetector CT imaging of the chest, abdomen and pelvis was performed following the standard protocol during bolus administration of intravenous contrast. Sagittal and coronal MPR images reconstructed from axial data set. CONTRAST:  Dilute oral contrast.  100 cc Isovue 300 IV. COMPARISON:  CT chest 06/14/2016, CT abdomen and pelvis 09/11/2016 FINDINGS: CT CHEST FINDINGS Cardiovascular: Atherosclerotic calcifications aorta and coronary arteries. Aorta normal caliber. Thoracic vascular structures grossly patent on nondedicated exam. Small pericardial fusion. Mediastinum/Nodes: Nasogastric tube traverses esophagus into stomach. Tip of endotracheal tube above carina. Small dense calcification within RIGHT thyroid lobe is unchanged. Base of cervical region otherwise unremarkable. Palpable with soft tissue density at the RIGHT hilum and paramediastinal RIGHT upper lobe, patient with known radiation fibrosis. Lungs/Pleura: Small BILATERAL pleural effusions larger on RIGHT. In addition to the previously identified radiation fibrosis changes in the RIGHT upper lobe, additional RIGHT upper lobe consolidation is seen question pneumonia. Compressive atelectasis in the lower lobes. Additional scattered airspace infiltrates primarily in RIGHT lung which could represent infection or less likely asymmetric edema. No pneumothorax. Musculoskeletal: Essentially nondisplaced mid sternal fracture sagittal image 81. Fractures of the anterior RIGHT third and fifth ribs. CT ABDOMEN PELVIS FINDINGS Hepatobiliary: Gallbladder surgically absent. Mild central intrahepatic and extrahepatic biliary dilatation little changed. No focal liver lesions. Pancreas: Normal appearance Spleen: Normal appearance Adrenals/Urinary Tract: Adrenal glands normal appearance. Foley catheter decompresses urinary bladder. Kidneys in ureters normal appearance. Stomach/Bowel: Diverticulosis changes at the sigmoid colon with decreased surrounding inflammation versus  prior study. Abscess collection seen on the previous exam no longer identified, pigtail drainage  catheter at site. Normal appendix. Stomach and bowel loops otherwise normal appearance. Vascular/Lymphatic: Atherosclerotic calcification aorta without aneurysm. No significant adenopathy. Reproductive: Uterus surgically absent. Other: Small amounts of free pelvic fluid and perihepatic/perisplenic fluid without free air. Small umbilical hernia containing fat. Musculoskeletal: No acute osseous findings. 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 Additional RIGHT upper lobe and scattered RIGHT lung infiltrates question pneumonia versus less likely asymmetric edema. Small BILATERAL pleural effusions RIGHT greater than LEFT with minimal adjacent compressive atelectasis of the lower lobes. Improved diverticulitis changes with resolution of the previously identified abscess collection adjacent to the sigmoid colon. Fractures of the sternum and the anterior RIGHT third and fifth ribs suspect related to CPR. Small pericardial fusion. Electronically Signed   By: Lavonia Dana M.D.   On: 09/14/2016 13:01   Ct Abdomen Pelvis W Contrast  Result Date: 09/14/2016 CLINICAL DATA:  Diverticulitis post abscess drainage and surgery, complicated by a supraventricular tachycardia then cardiac arrest post CPR and defibrillation, intubation, hypertensive, history COPD, coronary disease, diabetes mellitus, hypertension, non-small-cell lung cancer EXAM: CT CHEST, ABDOMEN, AND PELVIS WITH CONTRAST TECHNIQUE: Multidetector CT imaging of the chest, abdomen and pelvis was performed following the standard protocol during bolus administration of intravenous contrast. Sagittal and coronal MPR images reconstructed from axial data set. CONTRAST:  Dilute oral contrast.  100 cc Isovue 300 IV. COMPARISON:  CT chest 06/14/2016, CT abdomen and pelvis 09/11/2016 FINDINGS: CT CHEST FINDINGS  Cardiovascular: Atherosclerotic calcifications aorta and coronary arteries. Aorta normal caliber. Thoracic vascular structures grossly patent on nondedicated exam. Small pericardial fusion. Mediastinum/Nodes: Nasogastric tube traverses esophagus into stomach. Tip of endotracheal tube above carina. Small dense calcification within RIGHT thyroid lobe is unchanged. Base of cervical region otherwise unremarkable. Palpable with soft tissue density at the RIGHT hilum and paramediastinal RIGHT upper lobe, patient with known radiation fibrosis. Lungs/Pleura: Small BILATERAL pleural effusions larger on RIGHT. In addition to the previously identified radiation fibrosis changes in the RIGHT upper lobe, additional RIGHT upper lobe consolidation is seen question pneumonia. Compressive atelectasis in the lower lobes. Additional scattered airspace infiltrates primarily in RIGHT lung which could represent infection or less likely asymmetric edema. No pneumothorax. Musculoskeletal: Essentially nondisplaced mid sternal fracture sagittal image 81. Fractures of the anterior RIGHT third and fifth ribs. CT ABDOMEN PELVIS FINDINGS Hepatobiliary: Gallbladder surgically absent. Mild central intrahepatic and extrahepatic biliary dilatation little changed. No focal liver lesions. Pancreas: Normal appearance Spleen: Normal appearance Adrenals/Urinary Tract: Adrenal glands normal appearance. Foley catheter decompresses urinary bladder. Kidneys in ureters normal appearance. Stomach/Bowel: Diverticulosis changes at the sigmoid colon with decreased surrounding inflammation versus prior study. Abscess collection seen on the previous exam no longer identified, pigtail drainage catheter at site. Normal appendix. Stomach and bowel loops otherwise normal appearance. Vascular/Lymphatic: Atherosclerotic calcification aorta without aneurysm. No significant adenopathy. Reproductive: Uterus surgically absent. Other: Small amounts of free pelvic fluid and  perihepatic/perisplenic fluid without free air. Small umbilical hernia containing fat. Musculoskeletal: No acute osseous findings. 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 Additional RIGHT upper lobe and scattered RIGHT lung infiltrates question pneumonia versus less likely asymmetric edema. Small BILATERAL pleural effusions RIGHT greater than LEFT with minimal adjacent compressive atelectasis of the lower lobes. Improved diverticulitis changes with resolution of the previously identified abscess collection adjacent to the sigmoid colon. Fractures of the sternum and the anterior RIGHT third and fifth ribs suspect related to CPR. Small pericardial fusion. Electronically Signed  By: Lavonia Dana M.D.   On: 09/14/2016 13:01   Dg Chest Port 1 View  Result Date: 09/14/2016 CLINICAL DATA:  Central line insertion. EXAM: PORTABLE CHEST 1 VIEW 6:36 p.m. COMPARISON:  09/14/2016 8:40 a.m. FINDINGS: New right central line is been inserted and the tip appears in excellent position. Left central line, endotracheal tube, and NG tube appear in good position, unchanged. Right pleural effusion caps the right apex, unchanged. No pneumothorax. Scarring at the superior aspect of the right hilum, obscured by the ventilation apparatus. IMPRESSION: New right-sided central line appears in good position. No acute changes. Electronically Signed   By: Lorriane Shire M.D.   On: 09/14/2016 19:01    Anti-infectives: Anti-infectives    Start     Dose/Rate Route Frequency Ordered Stop   09/15/16 1000  anidulafungin (ERAXIS) 100 mg in sodium chloride 0.9 % 100 mL IVPB  Status:  Discontinued     100 mg over 90 Minutes Intravenous Every 24 hours 09/14/16 0906 09/15/16 1411   09/14/16 1000  anidulafungin (ERAXIS) 200 mg in sodium chloride 0.9 % 200 mL IVPB     200 mg over 180 Minutes Intravenous  Once 09/14/16 0841 09/14/16 2255   09/14/16 0800  vancomycin (VANCOCIN) IVPB  1000 mg/200 mL premix  Status:  Discontinued     1,000 mg 200 mL/hr over 60 Minutes Intravenous Every 12 hours 09/14/16 0737 09/16/16 0903   09/11/16 2200  piperacillin-tazobactam (ZOSYN) IVPB 3.375 g     3.375 g 12.5 mL/hr over 240 Minutes Intravenous Every 8 hours 09/11/16 1840     09/11/16 1400  piperacillin-tazobactam (ZOSYN) IVPB 3.375 g     3.375 g 100 mL/hr over 30 Minutes Intravenous  Once 09/11/16 1346 09/11/16 1502      Assessment/Plan: Diverticulitis with abscess - S/P perc drain, abscess resolved on CT, continue ABX. Continue clear liquids VDRF - per CCM, possible extubation this AM Cardiac arrest/CHF - per cardiology  LOS: 4 days    [name] K. 09/16/2016

## 2016-09-16 NOTE — Progress Notes (Signed)
Pharmacy Antibiotic Note  [name] is a 70 y.o. female admitted on 09/11/2016 with right lower abdominal pain and diverticulitis. Pharmacy has been consulted for Zosyn dosing. An abdominal  drain was placed on 11/20. Patient blood cultures have remained negative but diverticular abscess culture is growing Klebsiella.  WBC up to 14.3 and patient is afebrile. Creatinine-1.07 CrCL~ 50 ml/min.  Plan: Zosyn 3.375 g every 8 hours  Discontinue Vancomycin Eraxis D/Ced on 11/23 due to prolonged QT and clinical s/sx of infection Follow-up clinical signs/symptoms of infection, renal function, length of therapy  Height: '5\' 2"'$  (157.5 cm) Weight: 186 lb 4.6 oz (84.5 kg) IBW/kg (Calculated) : 50.1  Temp (24hrs), Avg:97.7 F (36.5 C), Min:97.4 F (36.3 C), Max:97.9 F (36.6 C)   Recent Labs Lab 09/11/16 1045 09/12/16 0422  09/14/16 0716 09/14/16 0747 09/14/16 2020 09/15/16 0450 09/15/16 1425 09/16/16 0434 09/16/16 0435 09/16/16 0656  WBC  --  11.0*  --  14.3*  --  12.0* 11.9*  --   --  9.4  --   CREATININE  --  0.70  < > 1.00 0.80 1.16* 1.09*  --  1.07*  --   --   LATICACIDVEN 1.71  --   --  5.6*  --   --   --  0.8  --   --   --   VANCOTROUGH  --   --   --   --   --   --   --   --   --   --  22*  < > = values in this interval not displayed.  Estimated Creatinine Clearance: 50.8 mL/min (by C-G formula based on SCr of 1.07 mg/dL (H)).    Allergies  Allergen Reactions  . Lactose Intolerance (Gi) Diarrhea  . Ibuprofen     REACTION: Anxiousness, hyperventilates    Antimicrobials this admission: Zosyn 11/19>> Vancomycin 11/22>>11/24 Eraxis 11/22>>11/23  Microbiology results: 11/19 BCx: NGTD  11/19 UCx: Multiple species present  11/20 Diverticular abscess: Klebsiella (Amp-R) ESBL neg 11/22 BCx: NGTD  11/22 MRSA PCR : Negative    Thank you for allowing pharmacy to be a part of this patient's care.  Ihor Austin, PharmD PGY1 Pharmacy Resident Pager;  [phone] 09/16/2016 9:42 AM

## 2016-09-16 NOTE — Progress Notes (Signed)
Patient Name: [name] Date of Encounter: 09/16/2016  Primary Cardiologist: [facility] Problem List     Active Problems:   Non-occlusive coronary artery disease   Torsades de pointes [facility])   Cardiac arrest with ventricular fibrillation ([place])   Takotsubo cardiomyopathy   Small cell lung cancer ([place])   DM (diabetes mellitus), type 2, uncontrolled with complications ([place])   Hyperlipidemia, mixed   GERD   COPD (chronic obstructive pulmonary disease) ([place])   Diverticulitis of large intestine with abscess   Diverticulitis of large intestine with perforation and abscess   Abdominal abscess ([place])   Colonic diverticular abscess   Acute respiratory failure ([place])     Subjective   Extubated yesterday. Started on BB, but no lasix. Mostly notes pain (seems diffuse, but mostly related to CPR)  Inpatient Medications    Scheduled Meds: . aspirin  81 mg Oral Daily  . chlorhexidine gluconate (MEDLINE KIT)  15 mL Mouth Rinse BID  . famotidine (PEPCID) IV  20 mg Intravenous Q24H  . furosemide  40 mg Intravenous BID  . heparin  5,000 Units Subcutaneous Q8H  . insulin aspart  0-15 Units Subcutaneous TID WC  . insulin aspart  0-5 Units Subcutaneous QHS  . ketorolac  1 drop Left Eye QID  . mouth rinse  15 mL Mouth Rinse 10 times per day  . metoprolol  2.5 mg Intravenous Q6H  . piperacillin-tazobactam (ZOSYN)  IV  3.375 g Intravenous Q8H  . sodium chloride flush  3 mL Intravenous Q12H  . sodium chloride flush  3 mL Intravenous Q12H  . sodium chloride flush  3 mL Intravenous Q12H   Continuous Infusions: . sodium chloride 1,000 mL (09/16/16 0200)   PRN Meds: sodium chloride, sodium chloride, acetaminophen **OR** acetaminophen, bisacodyl, fentaNYL (SUBLIMAZE) injection, HYDROcodone-acetaminophen, levalbuterol, magnesium citrate, ondansetron **OR** ondansetron (ZOFRAN) IV, ondansetron (ZOFRAN) IV, sodium chloride flush, sodium chloride flush   Vital Signs    Vitals:   09/16/16 0600 09/16/16 0700 09/16/16 0800 09/16/16 0817  BP: 134/69 134/62 130/68 130/68  Pulse: (!) 54 79 79 94  Resp: (!) _0 (!) 23  Temp:    97.8 F (36.6 C)  TempSrc:    Oral  SpO2: 91% 100% 100%   Weight:      Height:        Intake/Output Summary (Last 24 hours) at 09/16/16 0942 Last data filed at 09/16/16 0800  Gross per 24 hour  Intake          3028.54 ml  Output              915 ml  Net          2113.54 ml   Filed Weights   09/14/16 0443 09/15/16 0444 09/16/16 0300  Weight: 83.2 kg (183 lb 8 oz) 88.2 kg (194 lb 7.1 oz) 84.5 kg (186 lb 4.6 oz)    Physical Exam   GEN: Extubated & now very uncomfortable - pain from CPR HEENT: Grossly normal.  Neck: unable to assess (she would not really move to allow assessment). Cardiac: RRR with normal S1 and split S2. Cannot exclude gallop versus split S2 from IVCD. Soft 1/6 DM heard at the LUSB Respiratory: Coarse upper airway rhonchi. Would not take deep breaths  GI: Soft but remains tender. Nondistended., Minimal bowel sounds Skin: warm and dry, no rash.  Labs    CBC  Recent Labs  09/15/16 0450 09/16/16 0435  WBC 11.9* 9.4  NEUTROABS  --  7.0  HGB 10.6* 9.9*  HCT 33.1* 29.9*  MCV 90.4 90.1  PLT 219 153   Basic Metabolic Panel  Recent Labs  09/15/16 0450 09/16/16 0434 09/16/16 0435  NA 137 142  --   K 3.8 3.5  --   CL 109 112*  --   CO2 22 22  --   GLUCOSE 149* 107*  --   BUN 11 11  --   CREATININE 1.09* 1.07*  --   CALCIUM 8.2* 8.2*  --   MG 1.8  --  1.7  PHOS  --  3.1  --    Liver Function Tests  Recent Labs  09/16/16 0434  ALBUMIN 2.3*   No results for input(s): LIPASE, AMYLASE in the last 72 hours. Cardiac Enzymes  Recent Labs  09/14/16 0716 09/14/16 2020  TROPONINI 0.12* 0.52*   BNP Invalid input(s): POCBNP D-Dimer No results for input(s): DDIMER in the last 72 hours. Hemoglobin A1C No results for input(s): HGBA1C in the last 72 hours. Fasting Lipid Panel No results for  input(s): CHOL, HDL, LDLCALC, TRIG, CHOLHDL, LDLDIRECT in the last 72 hours. Thyroid Function Tests No results for input(s): TSH, T4TOTAL, T3FREE, THYROIDAB in the last 72 hours.  Invalid input(s): FREET3  Telemetry    Sinus rhythm in 70s - Personally Reviewed  ECG    Personally Reviewed: Sinus bradycardia, rate 57. Nonspecific IVCD (LBBB pattern).  Cannot rule out anteroseptal infarct, age undetermined with T wave abnormalities consider lateral ischemia.-QT-QTc prolonged with anterolateral ST and T-wave changes.  Radiology    Ct Chest W Contrast  Result Date: 09/14/2016 CLINICAL DATA:  Diverticulitis post abscess drainage and surgery, complicated by a supraventricular tachycardia then cardiac arrest post CPR and defibrillation, intubation, hypertensive, history COPD, coronary disease, diabetes mellitus, hypertension, non-small-cell lung cancer EXAM: CT CHEST, ABDOMEN, AND PELVIS WITH CONTRAST TECHNIQUE: Multidetector CT imaging of the chest, abdomen and pelvis was performed following the standard protocol during bolus administration of intravenous contrast. Sagittal and coronal MPR images reconstructed from axial data set. CONTRAST:  Dilute oral contrast.  100 cc Isovue 300 IV. COMPARISON:  CT chest 06/14/2016, CT abdomen and pelvis 09/11/2016 FINDINGS: CT CHEST FINDINGS Cardiovascular: Atherosclerotic calcifications aorta and coronary arteries. Aorta normal caliber. Thoracic vascular structures grossly patent on nondedicated exam. Small pericardial fusion. Mediastinum/Nodes: Nasogastric tube traverses esophagus into stomach. Tip of endotracheal tube above carina. Small dense calcification within RIGHT thyroid lobe is unchanged. Base of cervical region otherwise unremarkable. Palpable with soft tissue density at the RIGHT hilum and paramediastinal RIGHT upper lobe, patient with known radiation fibrosis. Lungs/Pleura: Small BILATERAL pleural effusions larger on RIGHT. In addition to the previously  identified radiation fibrosis changes in the RIGHT upper lobe, additional RIGHT upper lobe consolidation is seen question pneumonia. Compressive atelectasis in the lower lobes. Additional scattered airspace infiltrates primarily in RIGHT lung which could represent infection or less likely asymmetric edema. No pneumothorax. Musculoskeletal: Essentially nondisplaced mid sternal fracture sagittal image 81. Fractures of the anterior RIGHT third and fifth ribs. CT ABDOMEN PELVIS FINDINGS Hepatobiliary: Gallbladder surgically absent. Mild central intrahepatic and extrahepatic biliary dilatation little changed. No focal liver lesions. Pancreas: Normal appearance Spleen: Normal appearance Adrenals/Urinary Tract: Adrenal glands normal appearance. Foley catheter decompresses urinary bladder. Kidneys in ureters normal appearance. Stomach/Bowel: Diverticulosis changes at the sigmoid colon with decreased surrounding inflammation versus prior study. Abscess collection seen on the previous exam no longer identified, pigtail drainage catheter at site. Normal appendix. Stomach and bowel loops otherwise normal appearance. Vascular/Lymphatic: Atherosclerotic calcification  aorta without aneurysm. No significant adenopathy. Reproductive: Uterus surgically absent. Other: Small amounts of free pelvic fluid and perihepatic/perisplenic fluid without free air. Small umbilical hernia containing fat. Musculoskeletal: No acute osseous findings. 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 Additional RIGHT upper lobe and scattered RIGHT lung infiltrates question pneumonia versus less likely asymmetric edema. Small BILATERAL pleural effusions RIGHT greater than LEFT with minimal adjacent compressive atelectasis of the lower lobes. Improved diverticulitis changes with resolution of the previously identified abscess collection adjacent to the sigmoid colon. Fractures of the sternum and the  anterior RIGHT third and fifth ribs suspect related to CPR. Small pericardial fusion. Electronically Signed   By: Ulyses Southward M.D.   On: 09/14/2016 13:01   Ct Abdomen Pelvis W Contrast  Result Date: 09/14/2016 CLINICAL DATA:  Diverticulitis post abscess drainage and surgery, complicated by a supraventricular tachycardia then cardiac arrest post CPR and defibrillation, intubation, hypertensive, history COPD, coronary disease, diabetes mellitus, hypertension, non-small-cell lung cancer EXAM: CT CHEST, ABDOMEN, AND PELVIS WITH CONTRAST TECHNIQUE: Multidetector CT imaging of the chest, abdomen and pelvis was performed following the standard protocol during bolus administration of intravenous contrast. Sagittal and coronal MPR images reconstructed from axial data set. CONTRAST:  Dilute oral contrast.  100 cc Isovue 300 IV. COMPARISON:  CT chest 06/14/2016, CT abdomen and pelvis 09/11/2016 FINDINGS: CT CHEST FINDINGS Cardiovascular: Atherosclerotic calcifications aorta and coronary arteries. Aorta normal caliber. Thoracic vascular structures grossly patent on nondedicated exam. Small pericardial fusion. Mediastinum/Nodes: Nasogastric tube traverses esophagus into stomach. Tip of endotracheal tube above carina. Small dense calcification within RIGHT thyroid lobe is unchanged. Base of cervical region otherwise unremarkable. Palpable with soft tissue density at the RIGHT hilum and paramediastinal RIGHT upper lobe, patient with known radiation fibrosis. Lungs/Pleura: Small BILATERAL pleural effusions larger on RIGHT. In addition to the previously identified radiation fibrosis changes in the RIGHT upper lobe, additional RIGHT upper lobe consolidation is seen question pneumonia. Compressive atelectasis in the lower lobes. Additional scattered airspace infiltrates primarily in RIGHT lung which could represent infection or less likely asymmetric edema. No pneumothorax. Musculoskeletal: Essentially nondisplaced mid sternal  fracture sagittal image 81. Fractures of the anterior RIGHT third and fifth ribs. CT ABDOMEN PELVIS FINDINGS Hepatobiliary: Gallbladder surgically absent. Mild central intrahepatic and extrahepatic biliary dilatation little changed. No focal liver lesions. Pancreas: Normal appearance Spleen: Normal appearance Adrenals/Urinary Tract: Adrenal glands normal appearance. Foley catheter decompresses urinary bladder. Kidneys in ureters normal appearance. Stomach/Bowel: Diverticulosis changes at the sigmoid colon with decreased surrounding inflammation versus prior study. Abscess collection seen on the previous exam no longer identified, pigtail drainage catheter at site. Normal appendix. Stomach and bowel loops otherwise normal appearance. Vascular/Lymphatic: Atherosclerotic calcification aorta without aneurysm. No significant adenopathy. Reproductive: Uterus surgically absent. Other: Small amounts of free pelvic fluid and perihepatic/perisplenic fluid without free air. Small umbilical hernia containing fat. Musculoskeletal: No acute osseous findings. 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 Additional RIGHT upper lobe and scattered RIGHT lung infiltrates question pneumonia versus less likely asymmetric edema. Small BILATERAL pleural effusions RIGHT greater than LEFT with minimal adjacent compressive atelectasis of the lower lobes. Improved diverticulitis changes with resolution of the previously identified abscess collection adjacent to the sigmoid colon. Fractures of the sternum and the anterior RIGHT third and fifth ribs suspect related to CPR. Small pericardial fusion. Electronically Signed   By: Ulyses Southward M.D.   On: 09/14/2016 13:01   Dg  Chest Port 1 View  Result Date: 09/14/2016 CLINICAL DATA:  Central line insertion. EXAM: PORTABLE CHEST 1 VIEW 6:36 p.m. COMPARISON:  09/14/2016 8:40 a.m. FINDINGS: New right central line is been inserted and the  tip appears in excellent position. Left central line, endotracheal tube, and NG tube appear in good position, unchanged. Right pleural effusion caps the right apex, unchanged. No pneumothorax. Scarring at the superior aspect of the right hilum, obscured by the ventilation apparatus. IMPRESSION: New right-sided central line appears in good position. No acute changes. Electronically Signed   By: Lorriane Shire M.D.   On: 09/14/2016 19:01    Cardiac Studies    Echo 09/14/2016: Technically difficult study. EF 30-35% with moderately reduced EF. Mid-distal anteroseptal akinesis.  Cath 09/14/2016: Severe LV dysfunction (EF roughly 25%) with moderately-severely elevated LVEDP (25 mmHg). Mild nonobstructive CAD with 85% smooth narrowing of L2 small-caliber D1. -- Suggestive of Takotsubo Cardiomyopathy.  Patient Profile     [name] is a 70 year old female with a past medical history significant for nonobstructive CAD (cath 2006 and again in 2013 with normal LV function, mild, non-obstructive CAD noted), HTN, DM 2, HLD, COPD, former smoker, Hx of non-small cell lung cater s/p chemo and radiation, hx of diverticulitis who initially presented on 11/19 with LLQ pain and found to have diverticulitis with abscess formation. Drain placed. 11/22 am when into cardiac arrest - Torsades [place] Pointes - VF-VT. Intubated and transferred to the ICU.   Review of tele shows that she initially went into Torsades de pointes, quickly went into V.Fib to V.Tach.  Had ROSC in 6-8 minutes. Curiously Mag was 2.1 following the code.  Initial trop 0.12. EKG with no acute ST changes. Nonspecific IVCD new since last tracing. ECHO with EF 30-35% and mid-distal anteroseptal akinesis. No RV strain noted. Previously no know history of LV dysfunction.   CT scan of the abdomen done yesterday showed improving clinical findings. Cardiac catheterization yesterday showed nonobstructive coronary disease but with likely Takotsubo cardiac  myopathy.  Assessment & Plan     Cardiac Arrest from Torsade De Pointes/Polymorphic VT - not exactly sure the etiology. Much lites were relatively stable. This is questioned for this been related to Takotsubo cardiomyopathy induced event versus prolonged QT as seen on EKG.  For now, she remains on Lopressor IV 5 mg every 6 hours IV as she is not yet taking by mouth.  I will ask Electrophysiology to Consult - question if there could be medication interaction with her antibiotics leading to QT prolongation. (Pharmacy reviewed - antifungal Abx was not started until post arrest)  Unfortunately, it's clear that she probably had some fractured ribs. Pain control per [facility] M   Likely Takotsubo Cardiomyopathy - slightly etiology stress recurrent abdominal abscess. Unclear the cardiac myopathy is the cause of her cardiac arrest or a result of the arrest.   Now, initiate low-dose beta blocker. As pressures tolerated would consider ARB   Low-dose diuretic based on her elevated LVEDP and +7 L over the last 2 days  - will start with 3 doses '40mg'$  IV Lasix BID.   Will defer management of her ventilator and infectious issues to critical care and surgery. I discussed with the surgical team and it appears that her abdominal process has definitely improved.  Off Neo  We'll continue to follow, will also ensure that physiology sees her tomorrow to consider the possibility of drug interaction related to arrhythmia versus a primary event from her Takotsubo cardiomyopathy.  Would consider the possibility of LifeVest for discharge.  Signed, Glenetta Hew, MD  09/16/2016, 9:42 AM

## 2016-09-16 NOTE — Progress Notes (Signed)
Referring Physician(s): Dr. Coralie Keens  Supervising Physician: Jacqulynn Cadet  Patient Status:  [facility] - In-pt  Chief Complaint: LLQ abscess s/p perc drain  Subjective: Follow up of LLQ drain Events noted. Pt seen on floor C/o some abd pain and at drain site  Allergies: Lactose intolerance (gi) and Ibuprofen  Medications:  Current Facility-Administered Medications:  .  0.9 %  sodium chloride infusion, 1,000 mL, Intravenous, Continuous, Mercedes Camprubi-Soms, PA-C, Last Rate: 125 mL/hr at 09/16/16 0200, 1,000 mL at 09/16/16 0200 .  0.9 %  sodium chloride infusion, 250 mL, Intravenous, PRN, Leonie Man, MD .  0.9 %  sodium chloride infusion, 250 mL, Intravenous, PRN, Troy Sine, MD, Last Rate: 10 mL/hr at 09/16/16 0000, 250 mL at 09/16/16 0000 .  acetaminophen (TYLENOL) tablet 650 mg, 650 mg, Oral, Q6H PRN, 650 mg at 09/12/16 0904 **OR** acetaminophen (TYLENOL) suppository 650 mg, 650 mg, Rectal, Q6H PRN, Rondel Jumbo, PA-C .  aspirin chewable tablet 81 mg, 81 mg, Oral, Daily, Troy Sine, MD, 81 mg at 09/16/16 0950 .  bisacodyl (DULCOLAX) suppository 10 mg, 10 mg, Rectal, Daily PRN, Rondel Jumbo, PA-C .  chlorhexidine gluconate (MEDLINE KIT) (PERIDEX) 0.12 % solution 15 mL, 15 mL, Mouth Rinse, BID, Raylene Miyamoto, MD, 15 mL at 09/16/16 0800 .  famotidine (PEPCID) IVPB 20 mg premix, 20 mg, Intravenous, Q24H, Marijean Heath, NP, 20 mg at 09/16/16 0951 .  fentaNYL (SUBLIMAZE) injection 12.5-50 mcg, 12.5-50 mcg, Intravenous, Q2H PRN, Brand Males, MD, 50 mcg at 09/16/16 0950 .  furosemide (LASIX) injection 40 mg, 40 mg, Intravenous, BID, Leonie Man, MD, 40 mg at 09/16/16 1014 .  heparin injection 5,000 Units, 5,000 Units, Subcutaneous, Q8H, Troy Sine, MD, 5,000 Units at 09/16/16 0528 .  HYDROcodone-acetaminophen (NORCO/VICODIN) 5-325 MG per tablet 1-2 tablet, 1-2 tablet, Oral, Q6H PRN, Collene Gobble, MD, 2 tablet at 09/16/16 0950 .   insulin aspart (novoLOG) injection 0-15 Units, 0-15 Units, Subcutaneous, TID WC, Collene Gobble, MD .  insulin aspart (novoLOG) injection 0-5 Units, 0-5 Units, Subcutaneous, QHS, Collene Gobble, MD .  ketorolac (ACULAR) 0.5 % ophthalmic solution 1 drop, 1 drop, Left Eye, QID, Charlynne Cousins, MD, 1 drop at 09/16/16 1009 .  levalbuterol (XOPENEX) nebulizer solution 0.63 mg, 0.63 mg, Nebulization, Q3H PRN, Marijean Heath, NP .  magnesium citrate solution 1 Bottle, 1 Bottle, Oral, Once PRN, Rondel Jumbo, PA-C .  MEDLINE mouth rinse, 15 mL, Mouth Rinse, 10 times per day, Raylene Miyamoto, MD, 15 mL at 09/16/16 1000 .  metoprolol (LOPRESSOR) injection 2.5 mg, 2.5 mg, Intravenous, Q6H, Javier Glazier, MD, 2.5 mg at 09/16/16 0205 .  ondansetron (ZOFRAN) tablet 4 mg, 4 mg, Oral, Q6H PRN **OR** ondansetron (ZOFRAN) injection 4 mg, 4 mg, Intravenous, Q6H PRN, Rondel Jumbo, PA-C, 4 mg at 09/15/16 2137 .  ondansetron (ZOFRAN) injection 4 mg, 4 mg, Intravenous, Q6H PRN, Troy Sine, MD .  piperacillin-tazobactam (ZOSYN) IVPB 3.375 g, 3.375 g, Intravenous, Q8H, Rondel Jumbo, PA-C, 3.375 g at 09/16/16 0528 .  sodium chloride flush (NS) 0.9 % injection 3 mL, 3 mL, Intravenous, Q12H, Coralee Pesa Wertman, PA-C, 3 mL at 09/16/16 0951 .  sodium chloride flush (NS) 0.9 % injection 3 mL, 3 mL, Intravenous, Q12H, Leonie Man, MD, Stopped at 09/15/16 2133 .  sodium chloride flush (NS) 0.9 % injection 3 mL, 3 mL, Intravenous, PRN, Leonie Man, MD .  sodium chloride flush (NS) 0.9 % injection 3 mL, 3 mL, Intravenous, Q12H, Troy Sine, MD, Stopped at 09/15/16 2132 .  sodium chloride flush (NS) 0.9 % injection 3 mL, 3 mL, Intravenous, PRN, Troy Sine, MD    Vital Signs: BP 130/68   Pulse 94   Temp 97.8 F (36.6 C) (Oral)   Resp (!) 23   Ht 5' 2"  (1.575 m)   Wt 186 lb 4.6 oz (84.5 kg)   SpO2 100%   BMI 34.07 kg/m   Physical Exam LLQ drain intact, site clean. Output thin but  still dark yellow/brownish color Drain flushed at bedside, return of same fluid and some debris   Imaging:  Ct Image Guided Fluid Drain By Catheter  Result Date: 09/12/2016 CLINICAL DATA:  Sigmoid diverticulitis with focal diverticular abscess. EXAM: CT GUIDED CATHETER DRAINAGE OF PERITONEAL ABSCESS ANESTHESIA/SEDATION: 1.5 mg IV Versed 50 mcg IV Fentanyl Total Moderate Sedation Time:  30 minutes The patient's level of consciousness and physiologic status were continuously monitored during the procedure by Radiology nursing. PROCEDURE: The procedure, risks, benefits, and alternatives were explained to the patient. Questions regarding the procedure were encouraged and answered. The patient understands and consents to the procedure. A time out was performed prior to initiating the procedure. The anterior abdominal wall was prepped with chlorhexidine in a sterile fashion, and a sterile drape was applied covering the operative field. A sterile gown and sterile gloves were used for the procedure. Local anesthesia was provided with 1% Lidocaine. CT was performed in a supine position through the pelvis. Under CT guidance, an 18 gauge trocar needle was advanced to the level of a sigmoid diverticular abscess. After advancing a needle into the abscess, a guidewire was advanced. The tract was dilated and a 10 French percutaneous drainage catheter advanced over the wire. Catheter position was confirmed by CT. A fluid sample was aspirated and sent for culture analysis. The catheter was connected to suction bulb drainage. It was secured at the skin with a Prolene retention suture and StatLock device. COMPLICATIONS: None FINDINGS: Anterior percutaneous window was present to the level of the diverticular abscess located medial to the sigmoid colon. Aspiration yielded bloody and purulent fluid. The drainage catheter was able to be formed in the abscess. IMPRESSION: CT-guided percutaneous drainage of diverticular peritoneal  abscess located adjacent to the sigmoid colon. A sample of purulent fluid was sent for culture analysis. Electronically Signed   By: Aletta Edouard M.D.   On: 09/12/2016 16:45    Labs:  CBC:  Recent Labs  09/14/16 0716 09/14/16 0747 09/14/16 2020 09/15/16 0450 09/16/16 0435  WBC 14.3*  --  12.0* 11.9* 9.4  HGB 11.2* 11.2* 10.8* 10.6* 9.9*  HCT 34.9* 33.0* 33.9* 33.1* 29.9*  PLT 246  --  247 219 153    COAGS:  Recent Labs  06/14/16 2032 09/12/16 0422 09/15/16 0450  INR 1.20 1.34 1.29  APTT 34  --  26    BMP:  Recent Labs  09/13/16 0513 09/14/16 0716 09/14/16 0747 09/14/16 2020 09/15/16 0450 09/16/16 0434  NA 139 143 144  --  137 142  K 3.9 3.3* 3.3*  --  3.8 3.5  CL 114* 111 108  --  109 112*  CO2 20* 19*  --   --  22 22  GLUCOSE 164* 228* 216*  --  149* 107*  BUN 10 8 8   --  11 11  CALCIUM 8.2* 9.4  --   --  8.2*  8.2*  CREATININE 0.66 1.00 0.80 1.16* 1.09* 1.07*  GFRNONAA >60 57*  --  47* 51* 52*  GFRAA >60 >60  --  55* 59* >60    LIVER FUNCTION TESTS:  Recent Labs  12/29/15 2327 06/14/16 1158 09/11/16 1030 09/12/16 0422 09/16/16 0434  BILITOT 0.3 0.9 0.5 0.7  --   AST 24 22 17  14*  --   ALT 31 19 16  13*  --   ALKPHOS 92 105 86 67  --   PROT 6.8 7.8 6.9 5.2*  --   ALBUMIN 3.6 4.0 3.5 2.6* 2.3*    Assessment and Plan: Diverticulitis with LLQ abscess, s/p perc drain 11/20 Output less and thinner CT shows interval improvement IR following  Electronically Signed: Ascencion Dike 09/16/2016, 10:27 AM   I spent a total of 15 Minutes at the the patient's bedside AND on the patient's hospital floor or unit, greater than 50% of which was counseling/coordinating care for LLQ abscess drain

## 2016-09-16 NOTE — Progress Notes (Signed)
PULMONARY / CRITICAL CARE MEDICINE   Name: [name] MRN: [id number] DOB: [birth year]    ADMISSION DATE:  09/11/2016 CONSULTATION DATE:  11/22  REFERRING MD:  Sheliah Plane ([place])   CHIEF COMPLAINT:  PEA arrest   HISTORY OF PRESENT ILLNESS:  70 y.o. female with hx diverticulitis, CAD, COPD, DM, HTN, hx NSCLC in 2012 initially presented 11/19 with abd pain.  Initial w/u revealed diverticulitis with abscess.  She was seen by surgery, started on zosyn and had IR drain placed 11/20.  She was improving slowly until 11/22 when she was found to be in SVT 190's, then subsequently had PEA arrest with total 6 mins CPR, Defib x 2 for VFib. Intubated during code, awake post arrest but remained hypotensive.  PCCM consulted for ICU tx.   SUBJECTIVE:  Extubated successfully 11/23 C/o pain this am, abd and chest/ribs  REVIEW OF SYSTEMS:  Unable to obtain with patient intubated.  VITAL SIGNS: BP 130/68   Pulse 94   Temp 97.8 F (36.6 C) (Oral)   Resp (!) 23   Ht '5\' 2"'$  (1.575 m)   Wt 84.5 kg (186 lb 4.6 oz)   SpO2 100%   BMI 34.07 kg/m   HEMODYNAMICS: CVP:  [12 mmHg-16 mmHg] 14 mmHg  VENTILATOR SETTINGS: Vent Mode: CPAP;PSV FiO2 (%):  [40 %] 40 % PEEP:  [5 cmH20] 5 cmH20 Pressure Support:  [5 cmH20] 5 cmH20  INTAKE / OUTPUT: I/O last 3 completed shifts: In: 8999.1 [I.V.:7236.1; Other:108; IV [id number]:0315] Out: 2175 [Urine:1760; Emesis/NG output:405; Drains:10]  PHYSICAL EXAMINATION: General:  Awake. Alert. No acute distress. C/o pain  Integument:  Warm & dry. No rash on exposed skin.  HEENT:  Moist mucus membranes. No scleral injection or icterus.  Cardiovascular:  Regular rate. No edema.  Pulmonary:  Diminished breath sounds at the bases. Otherwise good aeration bilaterally. Symmetric chest wall expansion. Abdomen: Soft. Normal bowel sounds. Nondistended. Grossly nontender. Neurological: Nodding head appropriately. Following commands. Attends to  voice.   LABS:  BMET  Recent Labs Lab 09/14/16 0716 09/14/16 0747 09/14/16 2020 09/15/16 0450 09/16/16 0434  NA 143 144  --  137 142  K 3.3* 3.3*  --  3.8 3.5  CL 111 108  --  109 112*  CO2 19*  --   --  22 22  BUN 8 8  --  11 11  CREATININE 1.00 0.80 1.16* 1.09* 1.07*  GLUCOSE 228* 216*  --  149* 107*    Electrolytes  Recent Labs Lab 09/14/16 0716 09/15/16 0450 09/16/16 0434 09/16/16 0435  CALCIUM 9.4 8.2* 8.2*  --   MG 2.1 1.8  --  1.7  PHOS  --   --  3.1  --     CBC  Recent Labs Lab 09/14/16 2020 09/15/16 0450 09/16/16 0435  WBC 12.0* 11.9* 9.4  HGB 10.8* 10.6* 9.9*  HCT 33.9* 33.1* 29.9*  PLT 247 219 153    Coag's  Recent Labs Lab 09/12/16 0422 09/15/16 0450  APTT  --  26  INR 1.34 1.29    Sepsis Markers  Recent Labs Lab 09/11/16 1045 09/14/16 0716 09/14/16 1040 09/15/16 0450 09/15/16 1425 09/16/16 0435  LATICACIDVEN 1.71 5.6*  --   --  0.8  --   PROCALCITON  --   --  0.48 0.62  --  0.29    ABG  Recent Labs Lab 09/14/16 0930 09/15/16 0453  PHART 7.261* 7.307*  PCO2ART 51.5* 44.6  PO2ART 337* 70.0*    Liver Enzymes  Recent Labs Lab 09/11/16 1030 09/12/16 0422 09/16/16 0434  AST 17 14*  --   ALT 16 13*  --   ALKPHOS 86 67  --   BILITOT 0.5 0.7  --   ALBUMIN 3.5 2.6* 2.3*    Cardiac Enzymes  Recent Labs Lab 09/14/16 0716 09/14/16 2020  TROPONINI 0.12* 0.52*    Glucose  Recent Labs Lab 09/15/16 1212 09/15/16 1637 09/15/16 2122 09/16/16 0020 09/16/16 0419 09/16/16 0819  GLUCAP 151* 131* 128* 119* 101* 100*    Imaging No results found.   STUDIES:  CT abd/pelvis 11/19: Focal diverticulitis of the mid sigmoid colon with adjacent 2.5 x 4.1 cm abscess and foci of pneumoperitoneum in this area. Small amount of complex free fluid within the pelvis. CT chest/abd/pelvis 11/22: Radiation fibrosis changes in medial RIGHT upper lobe extending to the superior RIGHT hilum. Additional RIGHT upper lobe and  scattered RIGHT lung infiltrates question pneumonia versus less likely asymmetric edema. Small BILATERAL pleural effusions RIGHT greater than LEFT with minimal adjacent compressive atelectasis of the lower lobes. Improved diverticulitis changes with resolution of the previously identified abscess collection adjacent to the sigmoid colon. Fractures of the sternum and the anterior RIGHT third and fifth ribs suspect related to CPR. Small pericardial fusion. TTE 11/22:  LV EF 30-35% w/ akinesis of mid-apicalanteroseptalmycoardium. Grade 1 diastolic dysfunciton. LA & RA normal in size. RV normal in size & function. Trivial aortic regurg w/o stenosis. Trivial mitral regurg. No pulmonic regurg. Mild tricuspid regurg. Trivial pericardial effusion.  Cath 09/14/2016: Severe LV dysfunction (EF roughly 25%) with moderately-severely elevated LVEDP (25 mmHg). Mild nonobstructive CAD with 85% smooth narrowing of L2 small-caliber D1. -- Suggestive of Takotsubo Cardiomyopathy.  MICROBIOLOGY: Blood Ctx x2 11/19 >> Urine Ctx 11/19:  Multiple species present Diverticular Abscess Ctx 11/20:  Klebsiella pneumoniae (resistant Ampicillin) MRSA PCR 11/22:  Negative Blood Ctx x2 11/22 >>  ANTIBIOTICS: Zosyn 11/19 >> Vancomycin 11/22 >> 11/24 Eraxis 11/22 >> 11/23  SIGNIFICANT EVENTS: 11/22 - PEA arrest that was brief w/ 6 mins CPR  LINES/TUBES: OETT 7.5 11/22 >> 11/23 R FEM ART LINE 11/22 >> 11/24 R IJ CVL 11/22 >> OGT 11/22 >> 11/23 FOLEY 11/22 >>    ASSESSMENT / PLAN:  PULMONARY A: Acute Hypoxic Respiratory Failure - Post cardiac arrest. H/O SCLC - 2012 tx w/ Chemo & XRT. H/O COPD  P:   pulm hygiene  Xopenex neb prn  CARDIOVASCULAR A: Shock - Resolved. Likely cardiogenic.  PEA Arrest - S/P 6 minutes of CPR. Nonocculsive CAD on LHC. Prolonged QTc - Possibly secondary to Eraxis. SVT/Torsades Takotsubo Cardiomyopathy H/O HTN & Hyperlipidemia  P:  Continuous telemetry monitoring Vitals per unit  protocol Cardiology Following - Appreciate recommendations. Goal MAP >65 Continuing ASA '81mg'$   Lopressor 2.'5mg'$  IV q6hr per Cardiology Recs Holding on diuresis for now  RENAL A: Acute Renal Failure - Mild. Hypokalemia - Resolved. Lactic Acidosis AGMA - Resolved.  P:   Monitoring UOP with Foley Repeat LA now Trending renal function & electrolytes  Replacing electrolytes as indicated  GASTROINTESTINAL A: Sigmoid Diverticulitis w/ Abscess - S/P IR drain.  P:   Try to start clears and meds PO on 11/24 per [facility] recs Pepcid IV q24hr  HEMATOLOGIC A: Leukocytosis - Improving. Likely demargination from stress. Anemia - Mild. No signs of active bleeding.  P:  Trending cell counts w/ CBC SCDs Heparin [place] q8hr  INFECTIOUS A: Diverticular Abscess - Resolved on repeat CT w/ drain in place.  P:  Zosyn  D/c vanco 11/24 Eraxis stopped 11/23 Trending Procalcitonin per algorithm Awaiting finalization of repeat cultures  ENDOCRINE A: H/O DM Possible Adrenal Insufficiency - On Prednisone for 5 days pre-admit.  P:   Off Solu-Cortef  Change SSI to ac + hs SSI per Resistant Scale  NEUROLOGIC A: Pain Control - Rib fractures.  P:   RASS goal:  0 to -1 Add vicodin prn for pain control 11/24  FAMILY  - Updates:  Family updated at bedside 11/23 by Dr. Ashok Cordia. No family present 11/24 am  - Inter-disciplinary family meet or Palliative Care meeting due by:  11/29  TODAY'S SUMMARY:  70 y.o. female admitted 11/19 with diverticulitis with abscess s/p IR drain.  On 11/22 had brief PEA arrest, shock, ICU tx. patient found to have considerable cardiomyopathy. No evidence of worsening of intra-abdominal abscess or infection. Successfully weaned off vasopressor support. Extubated 11/23. Plan to SDU on 11/24   Baltazar Apo, MD, PhD 09/16/2016, 9:08 AM [facility] [phone] or if no answer [phone]

## 2016-09-16 NOTE — Progress Notes (Signed)
CRITICAL VALUE ALERT  Critical value received:  Vanc trough 22  Date of notification:  09/16/16  Time of notification:  0752  Critical value read back:Yes.    Nurse who received alert:  Zigmund Gottron, RN

## 2016-09-16 NOTE — Progress Notes (Signed)
[facility] ADULT ICU REPLACEMENT PROTOCOL FOR AM LAB REPLACEMENT ONLY  The patient does apply for the [facility] Adult ICU Electrolyte Replacment Protocol based on the criteria listed below:   1. Is GFR >/= 40 ml/min? Yes.    Patient's GFR today is 52 2. Is urine output >/= 0.5 ml/kg/hr for the last 6 hours? Yes.   Patient's UOP is 0.84 ml/kg/hr 3. Is BUN < 60 mg/dL? Yes.    Patient's BUN today is 11 4. Abnormal electrolyte  K 3.5, Mg 1.7 5. Ordered repletion with: per protocol 6. If a panic level lab has been reported, has the CCM MD in charge been notified? Yes.  .   Physician:  E Deterding  Christeen Douglas 09/16/2016 5:18 AM

## 2016-09-16 NOTE — Progress Notes (Signed)
Right Femoral Arterial Line removed per order Distal pulse 2+ prior to removal    Patient educated prior to removal  pressure held for 20 minutes, started at 1211 ended at 1231 Patient's blood pressure cycled every 5 minutes during removal, patients vital signs stable and patient had no complaints. Right Groin a level 0 post removal and distal pulsee 2+ Post arterial line removal instructions given to patient, patient verbalizes understanding in her own words and return demonstration given by patient to RN pressure dressing applied

## 2016-09-16 NOTE — Progress Notes (Signed)
PT Cancellation Note  Patient Details Name: [name] MRN: [id number] DOB: [birth year]   Cancelled Treatment:    Reason Eval/Treat Not Completed: Fatigue/lethargy limiting ability to participate;Patient declined, no reason specified. Pt refusing participation in therapy session at this time secondary to "pain in my ribs" and lethargy. PT will continue to f/u with pt as appropriate.   Clearnce Sorrel Harmonii Karle 09/16/2016, 9:29 AM Sherie Don, PT, DPT [phone]

## 2016-09-17 DIAGNOSIS — E1122 Type 2 diabetes mellitus with diabetic chronic kidney disease: Secondary | ICD-10-CM

## 2016-09-17 DIAGNOSIS — I129 Hypertensive chronic kidney disease with stage 1 through stage 4 chronic kidney disease, or unspecified chronic kidney disease: Secondary | ICD-10-CM

## 2016-09-17 LAB — MAGNESIUM: MAGNESIUM: 2.1 mg/dL (ref 1.7–2.4)

## 2016-09-17 LAB — AEROBIC/ANAEROBIC CULTURE (SURGICAL/DEEP WOUND)

## 2016-09-17 LAB — GLUCOSE, CAPILLARY
GLUCOSE-CAPILLARY: 135 mg/dL — AB (ref 65–99)
GLUCOSE-CAPILLARY: 188 mg/dL — AB (ref 65–99)
GLUCOSE-CAPILLARY: 157 mg/dL — AB (ref 65–99)
Glucose-Capillary: 146 mg/dL — ABNORMAL HIGH (ref 65–99)
Glucose-Capillary: 219 mg/dL — ABNORMAL HIGH (ref 65–99)

## 2016-09-17 LAB — CBC
HCT: 33.8 % — ABNORMAL LOW (ref 36.0–46.0)
Hemoglobin: 11.2 g/dL — ABNORMAL LOW (ref 12.0–15.0)
MCH: 29.6 pg (ref 26.0–34.0)
MCHC: 33.1 g/dL (ref 30.0–36.0)
MCV: 89.4 fL (ref 78.0–100.0)
Platelets: 175 10*3/uL (ref 150–400)
RBC: 3.78 MIL/uL — ABNORMAL LOW (ref 3.87–5.11)
RDW: 14.9 % (ref 11.5–15.5)
WBC: 9.4 10*3/uL (ref 4.0–10.5)

## 2016-09-17 LAB — BASIC METABOLIC PANEL
Anion gap: 9 (ref 5–15)
BUN: 10 mg/dL (ref 6–20)
CHLORIDE: 102 mmol/L (ref 101–111)
CO2: 29 mmol/L (ref 22–32)
CREATININE: 1.02 mg/dL — AB (ref 0.44–1.00)
Calcium: 8.4 mg/dL — ABNORMAL LOW (ref 8.9–10.3)
GFR calc Af Amer: >60 mL/min (ref >60)
GFR, EST NON AFRICAN AMERICAN: 55 mL/min — AB (ref >60)
Glucose, Bld: 122 mg/dL — ABNORMAL HIGH (ref 65–99)
Potassium: 3 mmol/L — ABNORMAL LOW (ref 3.5–5.1)
SODIUM: 140 mmol/L (ref 135–145)

## 2016-09-17 LAB — AEROBIC/ANAEROBIC CULTURE W GRAM STAIN (SURGICAL/DEEP WOUND)

## 2016-09-17 MED ORDER — POTASSIUM CHLORIDE CRYS ER 20 MEQ PO TBCR: 40.0000 meq | EXTENDED_RELEASE_TABLET | Freq: Two times a day (BID) | ORAL | Status: DC

## 2016-09-17 MED ORDER — POTASSIUM CHLORIDE CRYS ER 20 MEQ PO TBCR
40.0000 meq | EXTENDED_RELEASE_TABLET | ORAL | Status: AC
  Administered 2016-09-17 (×3): 40 meq via ORAL
  Filled 2016-09-17 (×2): qty 2

## 2016-09-17 NOTE — Progress Notes (Signed)
3 Days Post-Op  Subjective: Complaining of chest pain from CPR, some LLQ abdominal pain Tolerating liquids No BM yesterday Feels distended Minimal drain output  Objective: Vital signs in last 24 hours: Temp:  [97.5 F (36.4 C)-97.6 F (36.4 C)] 97.5 F (36.4 C) (11/25 0753) Pulse Rate:  [65-93] 93 (11/25 0753) Resp:  [12-21] 21 (11/25 0753) BP: (107-151)/(42-72) 151/72 (11/25 0753) SpO2:  [98 %-100 %] 98 % (11/25 0753) Weight:  [84.4 kg (186 lb 1.1 oz)] 84.4 kg (186 lb 1.1 oz) (11/25 0500) Last BM Date: 09/13/16  Intake/Output from previous day: 11/24 0701 - 11/25 0700 In: 1240 [P.O.:120; I.V.:1000; IV Piggyback:100] Out: 7205 [Urine:7200; Drains:5] Intake/Output this shift: Total I/O In: 50 [IV Piggyback:50] Out: -   General appearance: alert, cooperative and no distress Resp: rhonchi bilaterally Chest wall: right sided costochondral tenderness, left sided costochondral tenderness Cardio: regular rate and rhythm, S1, S2 normal, no murmur, click, rub or gallop GI: soft, mildly tender in LLQ; mildly distended; no rebound or guarding  Lab Results:   Recent Labs  09/16/16 0435 09/17/16 0500  WBC 9.4 9.4  HGB 9.9* 11.2*  HCT 29.9* 33.8*  PLT 153 175   BMET  Recent Labs  09/16/16 0434 09/17/16 0500  NA 142 140  K 3.5 3.0*  CL 112* 102  CO2 22 29  GLUCOSE 107* 122*  BUN 11 10  CREATININE 1.07* 1.02*  CALCIUM 8.2* 8.4*   PT/INR  Recent Labs  09/15/16 0450  LABPROT 16.2*  INR 1.29   ABG  Recent Labs  09/15/16 0453  PHART 7.307*  HCO3 22.3    Studies/Results: No results found.  Anti-infectives: Anti-infectives    Start     Dose/Rate Route Frequency Ordered Stop   09/15/16 1000  anidulafungin (ERAXIS) 100 mg in sodium chloride 0.9 % 100 mL IVPB  Status:  Discontinued     100 mg over 90 Minutes Intravenous Every 24 hours 09/14/16 0906 09/15/16 1411   09/14/16 1000  anidulafungin (ERAXIS) 200 mg in sodium chloride 0.9 % 200 mL IVPB     200  mg over 180 Minutes Intravenous  Once 09/14/16 0841 09/14/16 2255   09/14/16 0800  vancomycin (VANCOCIN) IVPB 1000 mg/200 mL premix  Status:  Discontinued     1,000 mg 200 mL/hr over 60 Minutes Intravenous Every 12 hours 09/14/16 0737 09/16/16 0903   09/11/16 2200  piperacillin-tazobactam (ZOSYN) IVPB 3.375 g     3.375 g 12.5 mL/hr over 240 Minutes Intravenous Every 8 hours 09/11/16 1840     09/11/16 1400  piperacillin-tazobactam (ZOSYN) IVPB 3.375 g     3.375 g 100 mL/hr over 30 Minutes Intravenous  Once 09/11/16 1346 09/11/16 1502      Assessment/Plan: Diverticulitis with abscess- S/P perc drain, abscess resolved on CT, continue ABX. Continue clear liquids until tenderness improves.  WBC normal.  If pain persists, may need repeat CT next week. VDRF- per CCM Cardiac arrest/CHF- per cardiology  LOS: 5 days    Jayd Forrey K. 09/17/2016

## 2016-09-17 NOTE — Progress Notes (Signed)
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 aspirin  81 mg Oral Daily  . famotidine (PEPCID) IV  20 mg Intravenous Q24H  . heparin  5,000 Units Subcutaneous Q8H  . insulin aspart  0-15 Units Subcutaneous TID WC  . insulin aspart  0-5 Units Subcutaneous QHS  . ketorolac  1 drop Left Eye QID  . mouth rinse  15 mL Mouth Rinse BID  . metoprolol  2.5 mg Intravenous Q6H  . piperacillin-tazobactam (ZOSYN)  IV  3.375 g Intravenous Q8H  . potassium chloride  40 mEq Oral BID  . sodium chloride flush  3 mL Intravenous Q12H   Continuous Infusions:  PRN Meds: sodium chloride, acetaminophen **OR** acetaminophen, bisacodyl, fentaNYL (SUBLIMAZE) injection, HYDROcodone-acetaminophen, levalbuterol, magnesium citrate, ondansetron **OR** ondansetron (ZOFRAN) IV, ondansetron (ZOFRAN) IV   Vital Signs    Vitals:   09/17/16 0025 09/17/16 0400 09/17/16 0500 09/17/16 0753  BP: (!) 130/58 (!) 138/59  (!) 151/72  Pulse: 76 91  93  Resp: 17 (!) 21  (!) 21  Temp:    97.5 F (36.4 C)  TempSrc:    Oral  SpO2: 98% 99%  98%  Weight:   186 lb 1.1 oz (84.4 kg)   Height:        Intake/Output Summary (Last 24 hours) at 09/17/16 0901 Last data filed at 09/17/16 0801  Gross per 24 hour  Intake             1030 ml  Output             7200 ml  Net            -6170 ml   Filed Weights     09/15/16 0444 09/16/16 0300 09/17/16 0500  Weight: 194 lb 7.1 oz (88.2 kg) 186 lb 4.6 oz (84.5 kg) 186 lb 1.1 oz (84.4 kg)    Physical Exam   GEN: Extubated & now very uncomfortable -   pain from CPR HEENT: Grossly normal.  Neck: supple Cardiac: RRR with normal S1 and split S2.  Respiratory: Coarse upper airway rhonchi. Would not take deep breaths  GI: Soft but remains tender. Nondistended., Minimal bowel sounds Skin: warm and dry, no rash.  Labs    CBC  Recent Labs  09/16/16 0435 09/17/16 0500  WBC 9.4 9.4  NEUTROABS 7.0  --   HGB 9.9* 11.2*  HCT 29.9* 33.8*  MCV 90.1 89.4  PLT 153 500   Basic Metabolic Panel  Recent Labs  09/16/16 0434 09/16/16 0435 09/17/16 0500  NA 142  --  140  K 3.5  --  3.0*  CL 112*  --  102  CO2 22  --  29  GLUCOSE 107*  --  122*  BUN 11  --  10  CREATININE 1.07*  --  1.02*  CALCIUM 8.2*  --  8.4*  MG  --  1.7 2.1  PHOS 3.1  --   --    Liver Function Tests  Recent Labs  09/16/16 0434  ALBUMIN 2.3*   No results for input(s): LIPASE, AMYLASE in the last 72 hours. Cardiac Enzymes  Recent Labs  09/14/16 2020  TROPONINI 0.52*      Telemetry    Sinus rhythm in 70s - Personally Reviewed no vt  ECG    Personally Reviewed: Sinus bradycardia, rate 57. Nonspecific IVCD (LBBB pattern).  Cannot rule out anteroseptal infarct, age undetermined with T wave abnormalities consider lateral ischemia.-QT-QTc prolonged with anterolateral ST and T-wave changes.  Radiology    No results found.  Cardiac Studies    Echo 09/14/2016: Technically difficult study. EF 30-35% with moderately reduced EF. Mid-distal anteroseptal akinesis.  Cath 09/14/2016: Severe LV dysfunction (EF roughly 25%) with moderately-severely elevated LVEDP (25 mmHg). Mild nonobstructive CAD with 85% smooth narrowing of L2 small-caliber D1. -- Suggestive of Takotsubo Cardiomyopathy.  Patient Profile     Ms. Madisyn Mawhinney is a 70 year old female with a past  medical history significant for nonobstructive CAD (cath 2006 and again in 2013 with normal LV function, mild, non-obstructive CAD noted), HTN, DM 2, HLD, COPD, former smoker, Hx of non-small cell lung cater s/p chemo and radiation, hx of diverticulitis who initially presented on 11/19 with LLQ pain and found to have diverticulitis with abscess formation. Drain placed. 11/22 am when into cardiac arrest - [place] - VF-VT. Intubated and transferred to the ICU.   Review of tele shows that she initially went into polymorphic VT without long short coupling  Had ROSC in 6-8 minutes. Curiously Mag was 2.1 following the code.  Initial trop 0.12. EKG with no acute ST changes. Nonspecific IVCD new since last tracing. ECHO with EF 30-35% and mid-distal anteroseptal akinesis. No RV strain noted. Previously no know history of LV dysfunction.   CT scan of the abdomen done yesterday showed improving clinical findings. 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 The evolution of the ECG with striking T-wave inversions makes me lean towards a mechanism related to the Tako Tsubo cardiomyopathy. Infrequently but certainly described.  However, with Dr. Allison Quarry concern regarding QT prolongation which has been present in  the past as well although borderline. We will aggressively replete hypokalemia. I suspect is related to the brisk diuresis noted within that output of 6 L yesterday. She is taking no diuretics.  When she is able to take by mouth, wishes for her low dose Ace inhibitors For her diabetes and cardiomyopathy; it is not clear to me why she was not on this as an outpatient.    Signed, Virl Axe, MD  09/17/2016, 9:01 AM

## 2016-09-17 NOTE — Progress Notes (Signed)
PROGRESS NOTE    [name]  EPP:[id number] DOB: 1946/09/13 DOA: 09/11/2016 PCP: [name], MD  Brief Narrative: 70 y.o. female with hx diverticulitis, CAD, COPD, DM, HTN, hx NSCLC in 2012 initially presented 11/19 with abd pain.  Initial w/u revealed diverticulitis with abscess.  She was seen by surgery, started on zosyn and had IR drain placed 11/20.  She was improving slowly until 11/22 when she was found to be in SVT 190's, then subsequently had PEA arrest with total 6 mins CPR, Defib x 2 for VFib. Intubated during code, awake post arrest but remained hypotensive.  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 -Cards and EP following -on IV lasix, volume overloaded -follow I/O, weights -IV lopressor    DM (diabetes mellitus), type 2, uncontrolled with complications ([place]) -on SSI now    Acute respiratory failure ([place]) -s/p PEA arrest, extubated 11/23 -improved -wean O2 as tolerated    Hypokalemia -replace    Hypertension -On IV lopressor now   COPD -stable, nebs PRN  DVT prophylaxis: Hep SQ Code Status:Full COde Family Communication:none at bedside Disposition Plan:Keep in SDU today   Consultants:   CCS  IR  Cards  EP  Procedures:  Antimicrobials:Zosyn   Subjective: Still having lower abd pain, no BMor flatus  Objective: Vitals:   09/17/16 0025 09/17/16 0400 09/17/16 0500 09/17/16 0753  BP: (!) 130/58 (!) 138/59   (!) 151/72  Pulse: 76 91  93  Resp: 17 (!) 21  (!) 21  Temp:    97.5 F (36.4 C)  TempSrc:    Oral  SpO2: 98% 99%  98%  Weight:   84.4 kg (186 lb 1.1 oz)   Height:        Intake/Output Summary (Last 24 hours) at 09/17/16 1131 Last data filed at 09/17/16 0801  Gross per 24 hour  Intake              730 ml  Output             6200 ml  Net            -5470 ml   Filed Weights   09/15/16 0444 09/16/16 0300 09/17/16 0500  Weight: 88.2 kg (194 lb 7.1 oz) 84.5 kg (186 lb 4.6 oz) 84.4 kg (186 lb 1.1 oz)    Examination:  General exam: Appears calm and comfortable, AAOx3, mild resp distress Respiratory system: faint basilar crackles. Cardiovascular system: S1 & S2 heard, RRR.  Gastrointestinal system: Abdomen is nondistended, soft and tender in LLQ, drain noted Central nervous system: Alert and oriented. No focal neurological deficits. Extremities: Symmetric 5 x 5 power. Skin: No rashes, lesions or ulcers Psychiatry: anxious    Data Reviewed: I have personally reviewed following labs and imaging studies  CBC:  Recent Labs Lab 09/11/16 1030  09/14/16 0716 09/14/16 0747 09/14/16 2020 09/15/16 0450 09/16/16 0435 09/17/16 0500  WBC 15.4*  < > 14.3*  --  12.0* 11.9* 9.4 9.4  NEUTROABS 12.4*  --   --   --   --   --  7.0  --   HGB 15.2*  < > 11.2* 11.2* 10.8* 10.6* 9.9* 11.2*  HCT 45.3  < > 34.9* 33.0* 33.9* 33.1* 29.9* 33.8*  MCV 90.2  < > 92.1  --  93.1 90.4 90.1 89.4  PLT 261  < > 246  --  247 219 153 175  < > = values in this interval not displayed. Basic Metabolic Panel:  Recent Labs Lab 09/13/16 0513 09/14/16 0716 09/14/16 0747 09/14/16 2020 09/15/16 0450 09/16/16 0434 09/16/16 0435 09/17/16 0500  NA 139 143 144  --  137 142  --  140  K 3.9 3.3* 3.3*  --  3.8 3.5  --  3.0*  CL 114* 111 108  --  109 112*  --  102  CO2 20* 19*  --   --  22 22  --  29  GLUCOSE 164* 228* 216*  --  149* 107*  --  122*  BUN '10 8 8  '$ --  11 11  --  10  CREATININE 0.66 1.00 0.80  1.16* 1.09* 1.07*  --  1.02*  CALCIUM 8.2* 9.4  --   --  8.2* 8.2*  --  8.4*  MG  --  2.1  --   --  1.8  --  1.7 2.1  PHOS  --   --   --   --   --  3.1  --   --    GFR: Estimated Creatinine Clearance: 53.2 mL/min (by C-G formula based on SCr of 1.02 mg/dL (H)). Liver Function Tests:  Recent Labs Lab 09/11/16 1030 09/12/16 0422 09/16/16 0434  AST 17 14*  --   ALT 16 13*  --   ALKPHOS 86 67  --   BILITOT 0.5 0.7  --   PROT 6.9 5.2*  --   ALBUMIN 3.5 2.6* 2.3*    Recent Labs Lab 09/11/16 1030  LIPASE 29   No results for input(s): AMMONIA in the last 168 hours. Coagulation Profile:  Recent Labs Lab 09/12/16 0422 09/15/16 0450  INR 1.34 1.29   Cardiac Enzymes:  Recent Labs Lab 09/14/16 0716 09/14/16 2020  TROPONINI 0.12* 0.52*   BNP (last 3 results) No results for input(s): PROBNP in the last 8760 hours. HbA1C: No results for input(s): HGBA1C in the last 72 hours. CBG:  Recent Labs Lab 09/16/16 0819 09/16/16 1218 09/16/16 1620 09/16/16 2014 09/17/16 0752  GLUCAP 100* 163* 146* 131* 135*   Lipid Profile: No results for input(s): CHOL, HDL, LDLCALC, TRIG, CHOLHDL, LDLDIRECT in the last 72 hours. Thyroid Function Tests: No results for input(s): TSH, T4TOTAL, FREET4, T3FREE, THYROIDAB in the last 72 hours. Anemia Panel: No results for input(s): VITAMINB12, FOLATE, FERRITIN, TIBC, IRON, RETICCTPCT in the last 72 hours. Urine analysis:    Component Value Date/Time   COLORURINE YELLOW 09/14/2016 1031   APPEARANCEUR HAZY (A) 09/14/2016 1031   LABSPEC 1.011 09/14/2016 1031   PHURINE 7.0 09/14/2016 1031   GLUCOSEU 100 (A) 09/14/2016 1031   HGBUR LARGE (A) 09/14/2016 1031   BILIRUBINUR NEGATIVE 09/14/2016 1031   KETONESUR NEGATIVE 09/14/2016 1031   PROTEINUR 100 (A) 09/14/2016 1031   UROBILINOGEN 1.0 02/03/2013 0912   NITRITE NEGATIVE 09/14/2016 1031   LEUKOCYTESUR NEGATIVE 09/14/2016 1031   Sepsis  Labs: '@LABRCNTIP'$ (procalcitonin:4,lacticidven:4)  ) Recent Results (from the past 240 hour(s))  Culture, blood (Routine X 2) w Reflex to ID Panel     Status: None   Collection Time: 09/11/16 11:50 AM  Result Value Ref Range  Status   Specimen Description BLOOD RIGHT ANTECUBITAL  Final   Special Requests BOTTLES DRAWN AEROBIC AND ANAEROBIC 5CC  Final   Culture NO GROWTH 5 DAYS  Final   Report Status 09/16/2016 FINAL  Final  Culture, blood (Routine X 2) w Reflex to ID Panel     Status: None   Collection Time: 09/11/16 11:55 AM  Result Value Ref Range Status   Specimen Description BLOOD RIGHT HAND  Final   Special Requests BOTTLES DRAWN AEROBIC AND ANAEROBIC 5CC  Final   Culture NO GROWTH 5 DAYS  Final   Report Status 09/16/2016 FINAL  Final  Urine culture     Status: Abnormal   Collection Time: 09/11/16  3:36 PM  Result Value Ref Range Status   Specimen Description URINE, RANDOM  Final   Special Requests NONE  Final   Culture MULTIPLE SPECIES PRESENT, SUGGEST RECOLLECTION (A)  Final   Report Status 09/12/2016 FINAL  Final  Aerobic/Anaerobic Culture (surgical/deep wound)     Status: None (Preliminary result)   Collection Time: 09/12/16 12:52 PM  Result Value Ref Range Status   Specimen Description ABSCESS  Final   Special Requests DIVERTICULAR ABSCESS  Final   Gram Stain   Final    MODERATE WBC PRESENT,BOTH PMN AND MONONUCLEAR RARE GRAM NEGATIVE RODS    Culture   Final    RARE KLEBSIELLA PNEUMONIAE HOLDING FOR POSSIBLE ANAEROBE    Report Status PENDING  Incomplete   Organism ID, Bacteria KLEBSIELLA PNEUMONIAE  Final      Susceptibility   Klebsiella pneumoniae - MIC*    AMPICILLIN 16 RESISTANT Resistant     CEFAZOLIN <=4 SENSITIVE Sensitive     CEFEPIME <=1 SENSITIVE Sensitive     CEFTAZIDIME <=1 SENSITIVE Sensitive     CEFTRIAXONE <=1 SENSITIVE Sensitive     CIPROFLOXACIN <=0.25 SENSITIVE Sensitive     GENTAMICIN <=1 SENSITIVE Sensitive     IMIPENEM <=0.25 SENSITIVE  Sensitive     TRIMETH/SULFA <=20 SENSITIVE Sensitive     AMPICILLIN/SULBACTAM <=2 SENSITIVE Sensitive     PIP/TAZO <=4 SENSITIVE Sensitive     Extended ESBL NEGATIVE Sensitive     * RARE KLEBSIELLA PNEUMONIAE  Surgical pcr screen     Status: None   Collection Time: 09/14/16  8:48 AM  Result Value Ref Range Status   MRSA, PCR NEGATIVE NEGATIVE Final   Staphylococcus aureus NEGATIVE NEGATIVE Final    Comment:        The Xpert SA Assay (FDA approved for NASAL specimens in patients over 49 years of age), is one component of a comprehensive surveillance program.  Test performance has been validated by [facility] for patients greater than or equal to 66 year old. It is not intended to diagnose infection nor to guide or monitor treatment.   MRSA PCR Screening     Status: None   Collection Time: 09/14/16  9:03 AM  Result Value Ref Range Status   MRSA by PCR NEGATIVE NEGATIVE Final    Comment:        The GeneXpert MRSA Assay (FDA approved for NASAL specimens only), is one component of a comprehensive MRSA colonization surveillance program. It is not intended to diagnose MRSA infection nor to guide or monitor treatment for MRSA infections.   Culture, blood (Routine X 2) w Reflex to ID Panel     Status: None (Preliminary result)   Collection Time: 09/14/16 10:38 AM  Result Value Ref Range Status   Specimen Description BLOOD RIGHT  ARM  Final   Special Requests BOTTLES DRAWN AEROBIC AND ANAEROBIC 5CC EACH  Final   Culture NO GROWTH 2 DAYS  Final   Report Status PENDING  Incomplete  Culture, blood (Routine X 2) w Reflex to ID Panel     Status: None (Preliminary result)   Collection Time: 09/14/16 10:48 AM  Result Value Ref Range Status   Specimen Description BLOOD RIGHT ARM  Final   Special Requests IN PEDIATRIC BOTTLE 3CC  Final   Culture NO GROWTH 2 DAYS  Final   Report Status PENDING  Incomplete         Radiology Studies: No results found.      Scheduled  Meds: . aspirin  81 mg Oral Daily  . famotidine (PEPCID) IV  20 mg Intravenous Q24H  . heparin  5,000 Units Subcutaneous Q8H  . insulin aspart  0-15 Units Subcutaneous TID WC  . insulin aspart  0-5 Units Subcutaneous QHS  . ketorolac  1 drop Left Eye QID  . mouth rinse  15 mL Mouth Rinse BID  . metoprolol  2.5 mg Intravenous Q6H  . piperacillin-tazobactam (ZOSYN)  IV  3.375 g Intravenous Q8H  . potassium chloride  40 mEq Oral Q3H  . sodium chloride flush  3 mL Intravenous Q12H   Continuous Infusions:   LOS: 5 days    Time spent: 21mn    PDomenic Polite MD [facility] Pager [phone] If 7PM-7AM, please contact night-coverage [email or web address] Password [email or web address]11/25/2017, 11:31 AM

## 2016-09-17 NOTE — Progress Notes (Signed)
Patient ID: [name], female   DOB: December 31, 1945, 70 y.o.   MRN: [id number]    Referring Physician(s): Dr. Coralie Keens  Supervising Physician: Jacqulynn Cadet  Patient Status: [facility] - In-pt  Chief Complaint: LLQ abscess  Subjective: Patient with no new complaints, except some abdominal soreness  Allergies: Lactose intolerance (gi) and Ibuprofen  Medications: Prior to Admission medications   Medication Sig Start Date End Date Taking? Authorizing Provider  acetaminophen (TYLENOL) 500 MG tablet Take 1,000 mg by mouth every 6 (six) hours as needed for headache (pain).   Yes Historical Provider, MD  albuterol (PROAIR HFA) 108 (90 Base) MCG/ACT inhaler Inhale 2 puffs into the lungs every 6 (six) hours as needed for wheezing or shortness of breath.   Yes Historical Provider, MD  albuterol (PROVENTIL) (2.5 MG/3ML) 0.083% nebulizer solution USE ONE VIAL IN NEBULIZER EVERY 4 HOURS AS NEEDED FOR WHEEZING OR SHORTNESS OF BREATH 12/16/15  Yes [name], MD  aspirin EC 81 MG tablet Take 81 mg by mouth at bedtime.   Yes Historical Provider, MD  aspirin-acetaminophen-caffeine (EXCEDRIN MIGRAINE) [phone] MG per tablet Take 1 tablet by mouth every 6 (six) hours as needed for headache.   Yes Historical Provider, MD  atorvastatin (LIPITOR) 80 MG tablet TAKE ONE TABLET BY MOUTH ONCE DAILY Patient taking differently: TAKE ONE TABLET BY MOUTH ONCE DAILY AT BEDTIME 07/18/16  Yes [name], MD  Bromfenac Sodium (PROLENSA) 0.07 % SOLN Place 1 drop into the left eye at bedtime.   Yes Historical Provider, MD  doxycycline (VIBRA-TABS) 100 MG tablet Take 1 tablet (100 mg total) by mouth 2 (two) times daily. Patient taking differently: Take 100 mg by mouth 2 (two) times daily. 10 day course started 09/07/16 09/07/16  Yes [name], MD  fluticasone (FLONASE) 50 MCG/ACT nasal spray Place 2 sprays into both nostrils at bedtime. 06/25/14  Yes [name], MD  Insulin Isophane &  Regular Human (HUMULIN 70/30 KWIKPEN) (70-30) 100 UNIT/ML PEN Inject 16 units subcutaneously before breakfast and 20 units before supper Patient taking differently: Inject 20-25 Units into the skin See admin instructions. Inject 20 units subcutaneously before breakfast and 25 units before supper 06/20/16  Yes Eugenie Filler, MD  loratadine (CLARITIN) 10 MG tablet Take 10 mg by mouth at bedtime.    Yes Historical Provider, MD  metFORMIN (GLUCOPHAGE) 500 MG tablet TAKE ONE TABLET BY MOUTH TWICE DAILY WITH MEALS 01/21/16  Yes [name], MD  nitroGLYCERIN (NITROSTAT) 0.4 MG SL tablet Place 1 tablet (0.4 mg total) under the tongue every 5 (five) minutes as needed. For chest pain Patient taking differently: Place 0.4 mg under the tongue every 5 (five) minutes as needed for chest pain.  12/31/12  Yes Lisabeth Pick, MD  pantoprazole (PROTONIX) 40 MG tablet TAKE ONE TABLET BY MOUTH ONCE DAILY Patient taking differently: TAKE ONE TABLET BY MOUTH ONCE DAILY AT BEDTIME 10/15/15  Yes [name], MD  prednisoLONE acetate (PRED FORTE) 1 % ophthalmic suspension Place 1 drop into the left eye at bedtime.  01/18/16  Yes Historical Provider, MD  predniSONE (DELTASONE) 20 MG tablet 2 tabs po qd x 5d Patient taking differently: Take 40 mg by mouth daily with breakfast. 5 day course started 09/07/16 09/07/16  Yes [name], MD  FLOVENT HFA 220 MCG/ACT inhaler INHALE TWO PUFFS BY MOUTH TWICE DAILY Patient not taking: Reported on 09/11/2016 01/12/16   [name], MD    Vital Signs:  BP (!) 151/72 (BP Location: Right Arm)   Pulse 93   Temp 97.5 F (36.4 C) (Oral)   Resp (!) 21   Ht '5\' 2"'$  (1.575 m)   Wt 186 lb 1.1 oz (84.4 kg)   SpO2 98%   BMI 34.03 kg/m   Physical Exam: Abd: soft, but still tender.  Drain intact with brown serous output.  Minimal output in bulb currently. 5cc documented in 24hrs.    Imaging: Ct Chest W Contrast  Result Date: 09/14/2016 CLINICAL DATA:  Diverticulitis  post abscess drainage and surgery, complicated by a supraventricular tachycardia then cardiac arrest post CPR and defibrillation, intubation, hypertensive, history COPD, coronary disease, diabetes mellitus, hypertension, non-small-cell lung cancer EXAM: CT CHEST, ABDOMEN, AND PELVIS WITH CONTRAST TECHNIQUE: Multidetector CT imaging of the chest, abdomen and pelvis was performed following the standard protocol during bolus administration of intravenous contrast. Sagittal and coronal MPR images reconstructed from axial data set. CONTRAST:  Dilute oral contrast.  100 cc Isovue 300 IV. COMPARISON:  CT chest 06/14/2016, CT abdomen and pelvis 09/11/2016 FINDINGS: CT CHEST FINDINGS Cardiovascular: Atherosclerotic calcifications aorta and coronary arteries. Aorta normal caliber. Thoracic vascular structures grossly patent on nondedicated exam. Small pericardial fusion. Mediastinum/Nodes: Nasogastric tube traverses esophagus into stomach. Tip of endotracheal tube above carina. Small dense calcification within RIGHT thyroid lobe is unchanged. Base of cervical region otherwise unremarkable. Palpable with soft tissue density at the RIGHT hilum and paramediastinal RIGHT upper lobe, patient with known radiation fibrosis. Lungs/Pleura: Small BILATERAL pleural effusions larger on RIGHT. In addition to the previously identified radiation fibrosis changes in the RIGHT upper lobe, additional RIGHT upper lobe consolidation is seen question pneumonia. Compressive atelectasis in the lower lobes. Additional scattered airspace infiltrates primarily in RIGHT lung which could represent infection or less likely asymmetric edema. No pneumothorax. Musculoskeletal: Essentially nondisplaced mid sternal fracture sagittal image 81. Fractures of the anterior RIGHT third and fifth ribs. CT ABDOMEN PELVIS FINDINGS Hepatobiliary: Gallbladder surgically absent. Mild central intrahepatic and extrahepatic biliary dilatation little changed. No focal liver  lesions. Pancreas: Normal appearance Spleen: Normal appearance Adrenals/Urinary Tract: Adrenal glands normal appearance. Foley catheter decompresses urinary bladder. Kidneys in ureters normal appearance. Stomach/Bowel: Diverticulosis changes at the sigmoid colon with decreased surrounding inflammation versus prior study. Abscess collection seen on the previous exam no longer identified, pigtail drainage catheter at site. Normal appendix. Stomach and bowel loops otherwise normal appearance. Vascular/Lymphatic: Atherosclerotic calcification aorta without aneurysm. No significant adenopathy. Reproductive: Uterus surgically absent. Other: Small amounts of free pelvic fluid and perihepatic/perisplenic fluid without free air. Small umbilical hernia containing fat. Musculoskeletal: No acute osseous findings. 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 Additional RIGHT upper lobe and scattered RIGHT lung infiltrates question pneumonia versus less likely asymmetric edema. Small BILATERAL pleural effusions RIGHT greater than LEFT with minimal adjacent compressive atelectasis of the lower lobes. Improved diverticulitis changes with resolution of the previously identified abscess collection adjacent to the sigmoid colon. Fractures of the sternum and the anterior RIGHT third and fifth ribs suspect related to CPR. Small pericardial fusion. Electronically Signed   By: Lavonia Dana M.D.   On: 09/14/2016 13:01   Ct Abdomen Pelvis W Contrast  Result Date: 09/14/2016 CLINICAL DATA:  Diverticulitis post abscess drainage and surgery, complicated by a supraventricular tachycardia then cardiac arrest post CPR and defibrillation, intubation, hypertensive, history COPD, coronary disease, diabetes mellitus, hypertension, non-small-cell lung cancer EXAM: CT CHEST, ABDOMEN, AND PELVIS WITH CONTRAST TECHNIQUE: Multidetector CT imaging of  the chest, abdomen and pelvis was performed  following the standard protocol during bolus administration of intravenous contrast. Sagittal and coronal MPR images reconstructed from axial data set. CONTRAST:  Dilute oral contrast.  100 cc Isovue 300 IV. COMPARISON:  CT chest 06/14/2016, CT abdomen and pelvis 09/11/2016 FINDINGS: CT CHEST FINDINGS Cardiovascular: Atherosclerotic calcifications aorta and coronary arteries. Aorta normal caliber. Thoracic vascular structures grossly patent on nondedicated exam. Small pericardial fusion. Mediastinum/Nodes: Nasogastric tube traverses esophagus into stomach. Tip of endotracheal tube above carina. Small dense calcification within RIGHT thyroid lobe is unchanged. Base of cervical region otherwise unremarkable. Palpable with soft tissue density at the RIGHT hilum and paramediastinal RIGHT upper lobe, patient with known radiation fibrosis. Lungs/Pleura: Small BILATERAL pleural effusions larger on RIGHT. In addition to the previously identified radiation fibrosis changes in the RIGHT upper lobe, additional RIGHT upper lobe consolidation is seen question pneumonia. Compressive atelectasis in the lower lobes. Additional scattered airspace infiltrates primarily in RIGHT lung which could represent infection or less likely asymmetric edema. No pneumothorax. Musculoskeletal: Essentially nondisplaced mid sternal fracture sagittal image 81. Fractures of the anterior RIGHT third and fifth ribs. CT ABDOMEN PELVIS FINDINGS Hepatobiliary: Gallbladder surgically absent. Mild central intrahepatic and extrahepatic biliary dilatation little changed. No focal liver lesions. Pancreas: Normal appearance Spleen: Normal appearance Adrenals/Urinary Tract: Adrenal glands normal appearance. Foley catheter decompresses urinary bladder. Kidneys in ureters normal appearance. Stomach/Bowel: Diverticulosis changes at the sigmoid colon with decreased surrounding inflammation versus prior study. Abscess collection seen on the previous exam no longer  identified, pigtail drainage catheter at site. Normal appendix. Stomach and bowel loops otherwise normal appearance. Vascular/Lymphatic: Atherosclerotic calcification aorta without aneurysm. No significant adenopathy. Reproductive: Uterus surgically absent. Other: Small amounts of free pelvic fluid and perihepatic/perisplenic fluid without free air. Small umbilical hernia containing fat. Musculoskeletal: No acute osseous findings. 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 Additional RIGHT upper lobe and scattered RIGHT lung infiltrates question pneumonia versus less likely asymmetric edema. Small BILATERAL pleural effusions RIGHT greater than LEFT with minimal adjacent compressive atelectasis of the lower lobes. Improved diverticulitis changes with resolution of the previously identified abscess collection adjacent to the sigmoid colon. Fractures of the sternum and the anterior RIGHT third and fifth ribs suspect related to CPR. Small pericardial fusion. Electronically Signed   By: Lavonia Dana M.D.   On: 09/14/2016 13:01   Dg Chest Port 1 View  Result Date: 09/14/2016 CLINICAL DATA:  Central line insertion. EXAM: PORTABLE CHEST 1 VIEW 6:36 p.m. COMPARISON:  09/14/2016 8:40 a.m. FINDINGS: New right central line is been inserted and the tip appears in excellent position. Left central line, endotracheal tube, and NG tube appear in good position, unchanged. Right pleural effusion caps the right apex, unchanged. No pneumothorax. Scarring at the superior aspect of the right hilum, obscured by the ventilation apparatus. IMPRESSION: New right-sided central line appears in good position. No acute changes. Electronically Signed   By: Lorriane Shire M.D.   On: 09/14/2016 19:01   Dg Chest Port 1 View  Result Date: 09/14/2016 CLINICAL DATA:  Central line placement EXAM: PORTABLE CHEST 1 VIEW COMPARISON:  09/14/2016 FINDINGS: Cardiomediastinal silhouette is  stable. Stable endotracheal tube position. NG tube in place. There is persistent consolidation in right upper lobe. Left IJ central line with tip in distal SVC. No pneumothorax. IMPRESSION: Stable support apparatus. Left IJ central line in place. No pneumothorax. Electronically Signed   By: [name] M.D.   On: 09/14/2016  09:09   Dg Chest Port 1 View  Result Date: 09/14/2016 CLINICAL DATA:  Hypoxia EXAM: PORTABLE CHEST 1 VIEW COMPARISON:  September 12, 2016 chest radiograph and chest CT June 14, 2016 FINDINGS: Endotracheal tube tip is 3.1 cm above the carina. No pneumothorax. There is persistent airspace consolidation throughout much of the right upper lobe. There is suspected residual mass in this area medially. The lungs elsewhere are clear. Heart size and pulmonary vascularity are normal. No adenopathy. There is atherosclerotic calcification in the aorta. No bone lesions. Incidental note is made of small cervical ribs bilaterally. IMPRESSION: Endotracheal tube as described without pneumothorax. There is airspace consolidation throughout much the right upper lobe. There is a nodular area in the medial aspect of the right upper lobe measuring 2.3 x 2.0 cm, concerning for residual mass in this area. This finding may warrant correlation with contrast enhanced chest CT to further assess. 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 Lungs elsewhere clear. Stable cardiac silhouette. There is aortic atherosclerosis. Electronically Signed   By: Lowella Grip III M.D.   On: 09/14/2016 08:06    Labs:  CBC:  Recent Labs  09/14/16 2020 09/15/16 0450 09/16/16 0435 09/17/16 0500  WBC 12.0* 11.9* 9.4 9.4  HGB 10.8* 10.6* 9.9* 11.2*  HCT 33.9* 33.1* 29.9* 33.8*  PLT 247 219 153 175    COAGS:  Recent Labs  06/14/16 2032 09/12/16 0422 09/15/16 0450  INR 1.20 1.34 1.29  APTT 34  --  26    BMP:  Recent Labs  09/14/16 0716  09/14/16 0747 09/14/16 2020 09/15/16 0450 09/16/16 0434 09/17/16 0500  NA 143 144  --  137 142 140  K 3.3* 3.3*  --  3.8 3.5 3.0*  CL 111 108  --  109 112* 102  CO2 19*  --   --  '22 22 29  '$ GLUCOSE 228* 216*  --  149* 107* 122*  BUN 8 8  --  '11 11 10  '$ CALCIUM 9.4  --   --  8.2* 8.2* 8.4*  CREATININE 1.00 0.80 1.16* 1.09* 1.07* 1.02*  GFRNONAA 57*  --  47* 51* 52* 55*  GFRAA >60  --  55* 59* >60 >60    LIVER FUNCTION TESTS:  Recent Labs  12/29/15 2327 06/14/16 1158 09/11/16 1030 09/12/16 0422 09/16/16 0434  BILITOT 0.3 0.9 0.5 0.7  --   AST '24 22 17 '$ 14*  --   ALT '31 19 16 '$ 13*  --   ALKPHOS 92 105 86 67  --   PROT 6.8 7.8 6.9 5.2*  --   ALBUMIN 3.6 4.0 3.5 2.6* 2.3*    Assessment and Plan: 1. Diverticular abscess, s/p drain placement on 11/20 -drain in good position.  Minimal output. -recent scan shows improvement.  D/w Dr. Laurence Ferrari, will plan for drain injection on Monday to confirm no fistula.  If not fistula, then will likely plan to remove at that time.   Electronically Signed: Henreitta Cea 09/17/2016, 10:42 AM   I spent a total of 15 Minutes at the the patient's bedside AND on the patient's hospital floor or unit, greater than 50% of which was counseling/coordinating care for diverticular abscess

## 2016-09-18 LAB — CBC
HEMATOCRIT: 31.1 % — AB (ref 36.0–46.0)
Hemoglobin: 10.2 g/dL — ABNORMAL LOW (ref 12.0–15.0)
MCH: 29.6 pg (ref 26.0–34.0)
MCHC: 32.8 g/dL (ref 30.0–36.0)
MCV: 90.1 fL (ref 78.0–100.0)
PLATELETS: 174 10*3/uL (ref 150–400)
RBC: 3.45 MIL/uL — ABNORMAL LOW (ref 3.87–5.11)
RDW: 15 % (ref 11.5–15.5)
WBC: 8.9 10*3/uL (ref 4.0–10.5)

## 2016-09-18 LAB — BASIC METABOLIC PANEL
Anion gap: 7 (ref 5–15)
BUN: 11 mg/dL (ref 6–20)
CHLORIDE: 100 mmol/L — AB (ref 101–111)
CO2: 33 mmol/L — AB (ref 22–32)
CREATININE: 0.95 mg/dL (ref 0.44–1.00)
Calcium: 8.3 mg/dL — ABNORMAL LOW (ref 8.9–10.3)
GFR calc Af Amer: >60 mL/min (ref >60)
GFR calc non Af Amer: >60 mL/min (ref >60)
Glucose, Bld: 115 mg/dL — ABNORMAL HIGH (ref 65–99)
POTASSIUM: 3.7 mmol/L (ref 3.5–5.1)
Sodium: 140 mmol/L (ref 135–145)

## 2016-09-18 LAB — GLUCOSE, CAPILLARY
GLUCOSE-CAPILLARY: 140 mg/dL — AB (ref 65–99)
Glucose-Capillary: 125 mg/dL — ABNORMAL HIGH (ref 65–99)
Glucose-Capillary: 189 mg/dL — ABNORMAL HIGH (ref 65–99)
Glucose-Capillary: 140 mg/dL — ABNORMAL HIGH (ref 65–99)

## 2016-09-18 LAB — TROPONIN I: Troponin I: 0.03 ng/mL (ref <0.03)

## 2016-09-18 MED ORDER — LISINOPRIL 2.5 MG PO TABS
2.5000 mg | ORAL_TABLET | Freq: Two times a day (BID) | ORAL | Status: DC
  Administered 2016-09-18 – 2016-09-27 (×18): 2.5 mg via ORAL
  Filled 2016-09-18 (×19): qty 1

## 2016-09-18 NOTE — Progress Notes (Signed)
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 aspirin  81 mg Oral Daily  . famotidine (PEPCID) IV  20 mg Intravenous Q24H  . heparin  5,000 Units Subcutaneous Q8H  . insulin aspart  0-15 Units Subcutaneous TID WC  . insulin aspart  0-5 Units Subcutaneous QHS  . ketorolac  1 drop Left Eye QID  . mouth rinse  15 mL Mouth Rinse BID  . metoprolol  2.5 mg Intravenous Q6H  . piperacillin-tazobactam (ZOSYN)  IV  3.375 g Intravenous Q8H  . sodium chloride flush  3 mL Intravenous Q12H   Continuous Infusions:  PRN Meds: sodium chloride, acetaminophen **OR** acetaminophen, bisacodyl, fentaNYL (SUBLIMAZE) injection, HYDROcodone-acetaminophen, levalbuterol, magnesium citrate, ondansetron **OR** ondansetron (ZOFRAN) IV, ondansetron (ZOFRAN) IV   Vital Signs    Vitals:   09/18/16 0315 09/18/16 0400 09/18/16 0620 09/18/16 0836  BP: (!) 111/57  (!) 142/65 (!) 141/59  Pulse: 66 69 63 80  Resp: '14 13 18 16  '$ Temp: 97.7 F (36.5 C)   97.5 F (36.4 C)  TempSrc: Oral   Oral  SpO2: 99% 100% 100% 97%  Weight: 171 lb 1.2 oz (77.6 kg)     Height:        Intake/Output Summary (Last 24 hours) at 09/18/16 1024 Last data filed at 09/18/16 6222  Gross per 24 hour  Intake               50 ml  Output              125 ml  Net              -75 ml   Filed Weights   09/16/16 0300  09/17/16 0500 09/18/16 0315  Weight: 186 lb 4.6 oz (84.5 kg) 186 lb 1.1 oz (84.4 kg) 171 lb 1.2 oz (77.6 kg)    Physical Exam   GEN: Extubated & now very uncomfortable -   pain from CPR HEENT: Grossly normal.  Neck: supple Cardiac: RRR with normal S1 and split S2.  Respiratory: Coarse upper airway rhonchi. Would not take deep breaths  GI: Soft but remains tender. Nondistended., Minimal bowel sounds Skin: warm and dry, no rash.  Labs    CBC  Recent Labs  09/16/16 0435 09/17/16 0500 09/18/16 0509  WBC 9.4 9.4 8.9  NEUTROABS 7.0  --   --   HGB 9.9* 11.2* 10.2*  HCT 29.9* 33.8* 31.1*  MCV 90.1 89.4 90.1  PLT 153 175 979   Basic Metabolic Panel  Recent Labs  09/16/16 0434 09/16/16 0435 09/17/16 0500 09/18/16 0508  NA 142  --  140 140  K 3.5  --  3.0* 3.7  CL 112*  --  102 100*  CO2 22  --  29 33*  GLUCOSE 107*  --  122* 115*  BUN 11  --  10 11  CREATININE  1.07*  --  1.02* 0.95  CALCIUM 8.2*  --  8.4* 8.3*  MG  --  1.7 2.1  --   PHOS 3.1  --   --   --    Liver Function Tests  Recent Labs  09/16/16 0434  ALBUMIN 2.3*   No results for input(s): LIPASE, AMYLASE in the last 72 hours. Cardiac Enzymes No results for input(s): CKTOTAL, CKMB, CKMBINDEX, TROPONINI in the last 72 hours.    Telemetry    Sinus rhythm in 70s - Personally Reviewed no vt; mean hr 65-70  ECG    Personally Reviewed: Sinus bradycardia, rate 57. Nonspecific IVCD (LBBB pattern).  Cannot rule out anteroseptal infarct, age undetermined with T wave abnormalities consider lateral ischemia.-QT-QTc prolonged with anterolateral ST and T-wave changes.  Radiology    No results found.  Cardiac Studies    Echo 09/14/2016: Technically difficult study. EF 30-35% with moderately reduced EF. Mid-distal anteroseptal akinesis.  Cath 09/14/2016: Severe LV dysfunction (EF roughly 25%) with moderately-severely elevated LVEDP (25 mmHg). Mild nonobstructive CAD with 85% smooth narrowing of L2  small-caliber D1. -- Suggestive of Takotsubo Cardiomyopathy.  Patient Profile     [name] is a 70 year old female with a past medical history significant for nonobstructive CAD (cath 2006 and again in 2013 with normal LV function, mild, non-obstructive CAD noted), HTN, DM 2, HLD, COPD, former smoker, Hx of non-small cell lung cater s/p chemo and radiation, hx of diverticulitis who initially presented on 11/19 with LLQ pain and found to have diverticulitis with abscess formation. Drain placed. 11/22 am when into cardiac arrest - [place] - VF-VT. Intubated and transferred to the ICU.   Review of tele shows that she initially went into polymorphic VT without long short coupling  Had ROSC in 6-8 minutes. Curiously Mag was 2.1 following the code.  Initial trop 0.12. EKG with no acute ST changes. Nonspecific IVCD new since last tracing. ECHO with EF 30-35% and mid-distal anteroseptal akinesis. No RV strain noted. Previously no know history of LV dysfunction.   CT scan of the abdomen done yesterday showed improving clinical findings. 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 The evolution of the ECG with striking T-wave inversions makes me lean towards a mechanism related to the Tako Tsubo  cardiomyopathy. Infrequently but certainly described.  Will anticipate discharge with LifeVest  Add ACE ; today and carvedilol tomorrow   Had some bradycardia earlier in hospitalization, but none recently   Will check troponin      Signed, Virl Axe, MD  09/18/2016, 10:24 AM

## 2016-09-18 NOTE — Progress Notes (Signed)
4 Days Post-Op  Subjective: Less abdominal pain than yesterday Flatus, no BM Minimal drain output  Objective: Vital signs in last 24 hours: Temp:  [97.4 F (36.3 C)-97.7 F (36.5 C)] 97.5 F (36.4 C) (11/26 0836) Pulse Rate:  [63-96] 80 (11/26 0836) Resp:  [13-19] 16 (11/26 0836) BP: (86-149)/(53-72) 141/59 (11/26 0836) SpO2:  [97 %-100 %] 97 % (11/26 0836) Weight:  [77.6 kg (171 lb 1.2 oz)] 77.6 kg (171 lb 1.2 oz) (11/26 0315) Last BM Date: 09/13/16  Intake/Output from previous day: 11/25 0701 - 11/26 0700 In: 100 [IV Piggyback:100] Out: 125 [Urine:125] Intake/Output this shift: Total I/O In: 150 [IV Piggyback:150] Out: -   General appearance: alert, cooperative and no distress Resp: rhonchi bilaterally Chest wall: right sided costochondral tenderness, left sided costochondral tenderness Cardio: regular rate and rhythm, S1, S2 normal, no murmur, click, rub or gallop GI: soft, mildly tender in LLQ; mildly distended; no rebound or guarding  Lab Results:   Recent Labs  09/17/16 0500 09/18/16 0509  WBC 9.4 8.9  HGB 11.2* 10.2*  HCT 33.8* 31.1*  PLT 175 174   BMET  Recent Labs  09/17/16 0500 09/18/16 0508  NA 140 140  K 3.0* 3.7  CL 102 100*  CO2 29 33*  GLUCOSE 122* 115*  BUN 10 11  CREATININE 1.02* 0.95  CALCIUM 8.4* 8.3*   PT/INR No results for input(s): LABPROT, INR in the last 72 hours. ABG No results for input(s): PHART, HCO3 in the last 72 hours.  Invalid input(s): PCO2, PO2  Studies/Results: No results found.  Anti-infectives: Anti-infectives    Start     Dose/Rate Route Frequency Ordered Stop   09/15/16 1000  anidulafungin (ERAXIS) 100 mg in sodium chloride 0.9 % 100 mL IVPB  Status:  Discontinued     100 mg over 90 Minutes Intravenous Every 24 hours 09/14/16 0906 09/15/16 1411   09/14/16 1000  anidulafungin (ERAXIS) 200 mg in sodium chloride 0.9 % 200 mL IVPB     200 mg over 180 Minutes Intravenous  Once 09/14/16 0841 09/14/16 2255    09/14/16 0800  vancomycin (VANCOCIN) IVPB 1000 mg/200 mL premix  Status:  Discontinued     1,000 mg 200 mL/hr over 60 Minutes Intravenous Every 12 hours 09/14/16 0737 09/16/16 0903   09/11/16 2200  piperacillin-tazobactam (ZOSYN) IVPB 3.375 g     3.375 g 12.5 mL/hr over 240 Minutes Intravenous Every 8 hours 09/11/16 1840     09/11/16 1400  piperacillin-tazobactam (ZOSYN) IVPB 3.375 g     3.375 g 100 mL/hr over 30 Minutes Intravenous  Once 09/11/16 1346 09/11/16 1502      Assessment/Plan: Diverticulitis with abscess- S/P perc drain, abscess resolved on CT, continue ABX. Continue clear liquids until tenderness improves.  WBC normal.  If pain persists, may need repeat CT next week. VDRF- per CCM Cardiac arrest/CHF- per cardiology  LOS: 6 days    [name] K. 09/18/2016

## 2016-09-18 NOTE — Progress Notes (Addendum)
PROGRESS NOTE    [name]  TIW:[id number] DOB: Apr 07, 1946 DOA: 09/11/2016 PCP: [name], MD  Brief Narrative: 70 y.o. female with hx diverticulitis, CAD, COPD, DM, HTN, hx NSCLC in 2012 initially presented 11/19 with abd pain.  Initial w/u revealed diverticulitis with abscess.  She was seen by surgery, started on zosyn and had IR drain placed 11/20.  She was improving slowly until 11/22 when she was found to be in SVT 190's, then subsequently had PEA arrest with total 6 mins CPR, Defib x 2 for VFib. Intubated during code, awake post arrest but remained hypotensive.  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 -Cards and EP following -got  IV lasix 11/25 for volume overload, diuresed well, 5.9L negative, defer diuretics to cards -follow I/O, weights -on IV lopressor now, change to PO when PO intake consistent    DM (diabetes mellitus), type 2, uncontrolled with complications ([place]) -on SSI now -on insulin 70/30 and metformin at home    Acute respiratory failure ([place]) -s/p PEA arrest, extubated 11/23 -improved -wean O2 as tolerated    Hypokalemia -replaced    Hypertension -On IV lopressor now   COPD -stable, nebs PRN  DVT prophylaxis: Hep SQ Code Status:Full COde Family Communication:none at bedside Disposition Plan: transfer to  tele  Consultants:   CCS  IR  Cards  EP  Procedures:  Antimicrobials:Zosyn   Subjective: Still having lower abd pain, passing gas, minimal output from drain  Objective: Vitals:   09/18/16 0315 09/18/16 0400 09/18/16 0620 09/18/16 0836  BP: (!) 111/57  (!) 142/65 (!) 141/59  Pulse: 66 69 63 80  Resp: '14 13 18 16  '$ Temp: 97.7 F (36.5 C)   97.5 F (36.4 C)  TempSrc: Oral   Oral  SpO2: 99% 100% 100% 97%  Weight: 77.6 kg (171 lb 1.2 oz)     Height:        Intake/Output Summary (Last 24 hours) at 09/18/16 1131 Last data filed at 09/18/16 1003  Gross per 24 hour  Intake              200 ml  Output              125 ml  Net               75 ml   Filed Weights   09/16/16 0300 09/17/16 0500 09/18/16 0315  Weight: 84.5 kg (186 lb 4.6 oz) 84.4 kg (186 lb 1.1 oz) 77.6 kg (171 lb 1.2 oz)    Examination:  General exam: Appears calm and comfortable, AAOx3, no distress today, appears much older than stated age Respiratory system: faint basilar crackles. Cardiovascular system: S1 & S2 heard, RRR.  Gastrointestinal system: Abdomen is nondistended, soft and tender in LLQ, drain noted, minimal output Central nervous system: Alert and oriented. No focal neurological deficits. Extremities: Symmetric 5 x 5 power. Skin: No rashes, lesions or  ulcers Psychiatry: more appropriate     Data Reviewed: I have personally reviewed following labs and imaging studies  CBC:  Recent Labs Lab 09/14/16 2020 09/15/16 0450 09/16/16 0435 09/17/16 0500 09/18/16 0509  WBC 12.0* 11.9* 9.4 9.4 8.9  NEUTROABS  --   --  7.0  --   --   HGB 10.8* 10.6* 9.9* 11.2* 10.2*  HCT 33.9* 33.1* 29.9* 33.8* 31.1*  MCV 93.1 90.4 90.1 89.4 90.1  PLT 247 219 153 175 916   Basic Metabolic Panel:  Recent Labs Lab 09/14/16 0716 09/14/16 0747 09/14/16 2020 09/15/16 0450 09/16/16 0434 09/16/16 0435 09/17/16 0500 09/18/16 0508  NA 143 144  --  137 142  --  140 140  K 3.3* 3.3*  --  3.8 3.5  --   3.0* 3.7  CL 111 108  --  109 112*  --  102 100*  CO2 19*  --   --  22 22  --  29 33*  GLUCOSE 228* 216*  --  149* 107*  --  122* 115*  BUN 8 8  --  11 11  --  10 11  CREATININE 1.00 0.80 1.16* 1.09* 1.07*  --  1.02* 0.95  CALCIUM 9.4  --   --  8.2* 8.2*  --  8.4* 8.3*  MG 2.1  --   --  1.8  --  1.7 2.1  --   PHOS  --   --   --   --  3.1  --   --   --    GFR: Estimated Creatinine Clearance: 54.7 mL/min (by C-G formula based on SCr of 0.95 mg/dL). Liver Function Tests:  Recent Labs Lab 09/12/16 0422 09/16/16 0434  AST 14*  --   ALT 13*  --   ALKPHOS 67  --   BILITOT 0.7  --   PROT 5.2*  --   ALBUMIN 2.6* 2.3*   No results for input(s): LIPASE, AMYLASE in the last 168 hours. No results for input(s): AMMONIA in the last 168 hours. Coagulation Profile:  Recent Labs Lab 09/12/16 0422 09/15/16 0450  INR 1.34 1.29   Cardiac Enzymes:  Recent Labs Lab 09/14/16 0716 09/14/16 2020  TROPONINI 0.12* 0.52*   BNP (last 3 results) No results for input(s): PROBNP in the last 8760 hours. HbA1C: No results for input(s): HGBA1C in the last 72 hours. CBG:  Recent Labs Lab 09/17/16 0752 09/17/16 1222 09/17/16 1604 09/17/16 2152 09/18/16 0831  GLUCAP 135* 188* 219* 157* 125*   Lipid Profile: No results for input(s): CHOL, HDL, LDLCALC, TRIG, CHOLHDL, LDLDIRECT in the last 72 hours. Thyroid Function Tests: No results for input(s): TSH, T4TOTAL, FREET4, T3FREE, THYROIDAB in the last 72 hours. Anemia Panel: No results for input(s): VITAMINB12, FOLATE, FERRITIN, TIBC, IRON, RETICCTPCT in the last 72 hours. Urine analysis:    Component Value Date/Time   COLORURINE YELLOW 09/14/2016 1031   APPEARANCEUR HAZY (A) 09/14/2016 1031   LABSPEC 1.011 09/14/2016 1031   PHURINE 7.0 09/14/2016 1031   GLUCOSEU 100 (A) 09/14/2016 1031   HGBUR LARGE (A) 09/14/2016 1031   BILIRUBINUR NEGATIVE 09/14/2016 1031   KETONESUR NEGATIVE 09/14/2016 1031   PROTEINUR 100 (A) 09/14/2016 1031    UROBILINOGEN 1.0 02/03/2013 0912   NITRITE NEGATIVE 09/14/2016 1031   LEUKOCYTESUR NEGATIVE 09/14/2016 1031   Sepsis Labs: '@LABRCNTIP'$ (procalcitonin:4,lacticidven:4)  ) Recent Results (from the past 240 hour(s))  Culture, blood (Routine X 2) w Reflex to ID Panel     Status: None  Collection Time: 09/11/16 11:50 AM  Result Value Ref Range Status   Specimen Description BLOOD RIGHT ANTECUBITAL  Final   Special Requests BOTTLES DRAWN AEROBIC AND ANAEROBIC 5CC  Final   Culture NO GROWTH 5 DAYS  Final   Report Status 09/16/2016 FINAL  Final  Culture, blood (Routine X 2) w Reflex to ID Panel     Status: None   Collection Time: 09/11/16 11:55 AM  Result Value Ref Range Status   Specimen Description BLOOD RIGHT HAND  Final   Special Requests BOTTLES DRAWN AEROBIC AND ANAEROBIC 5CC  Final   Culture NO GROWTH 5 DAYS  Final   Report Status 09/16/2016 FINAL  Final  Urine culture     Status: Abnormal   Collection Time: 09/11/16  3:36 PM  Result Value Ref Range Status   Specimen Description URINE, RANDOM  Final   Special Requests NONE  Final   Culture MULTIPLE SPECIES PRESENT, SUGGEST RECOLLECTION (A)  Final   Report Status 09/12/2016 FINAL  Final  Aerobic/Anaerobic Culture (surgical/deep wound)     Status: None   Collection Time: 09/12/16 12:52 PM  Result Value Ref Range Status   Specimen Description ABSCESS  Final   Special Requests DIVERTICULAR ABSCESS  Final   Gram Stain   Final    MODERATE WBC PRESENT,BOTH PMN AND MONONUCLEAR RARE GRAM NEGATIVE RODS    Culture   Final    RARE KLEBSIELLA PNEUMONIAE MIXED ANAEROBIC FLORA PRESENT.  CALL LAB IF FURTHER IID REQUIRED.    Report Status 09/17/2016 FINAL  Final   Organism ID, Bacteria KLEBSIELLA PNEUMONIAE  Final      Susceptibility   Klebsiella pneumoniae - MIC*    AMPICILLIN 16 RESISTANT Resistant     CEFAZOLIN <=4 SENSITIVE Sensitive     CEFEPIME <=1 SENSITIVE Sensitive     CEFTAZIDIME <=1 SENSITIVE Sensitive     CEFTRIAXONE <=1  SENSITIVE Sensitive     CIPROFLOXACIN <=0.25 SENSITIVE Sensitive     GENTAMICIN <=1 SENSITIVE Sensitive     IMIPENEM <=0.25 SENSITIVE Sensitive     TRIMETH/SULFA <=20 SENSITIVE Sensitive     AMPICILLIN/SULBACTAM <=2 SENSITIVE Sensitive     PIP/TAZO <=4 SENSITIVE Sensitive     Extended ESBL NEGATIVE Sensitive     * RARE KLEBSIELLA PNEUMONIAE  Surgical pcr screen     Status: None   Collection Time: 09/14/16  8:48 AM  Result Value Ref Range Status   MRSA, PCR NEGATIVE NEGATIVE Final   Staphylococcus aureus NEGATIVE NEGATIVE Final    Comment:        The Xpert SA Assay (FDA approved for NASAL specimens in patients over 85 years of age), is one component of a comprehensive surveillance program.  Test performance has been validated by [facility] for patients greater than or equal to 34 year old. It is not intended to diagnose infection nor to guide or monitor treatment.   MRSA PCR Screening     Status: None   Collection Time: 09/14/16  9:03 AM  Result Value Ref Range Status   MRSA by PCR NEGATIVE NEGATIVE Final    Comment:        The GeneXpert MRSA Assay (FDA approved for NASAL specimens only), is one component of a comprehensive MRSA colonization surveillance program. It is not intended to diagnose MRSA infection nor to guide or monitor treatment for MRSA infections.   Culture, blood (Routine X 2) w Reflex to ID Panel     Status: None (Preliminary result)   Collection  Time: 09/14/16 10:38 AM  Result Value Ref Range Status   Specimen Description BLOOD RIGHT ARM  Final   Special Requests BOTTLES DRAWN AEROBIC AND ANAEROBIC 5CC EACH  Final   Culture NO GROWTH 3 DAYS  Final   Report Status PENDING  Incomplete  Culture, blood (Routine X 2) w Reflex to ID Panel     Status: None (Preliminary result)   Collection Time: 09/14/16 10:48 AM  Result Value Ref Range Status   Specimen Description BLOOD RIGHT ARM  Final   Special Requests IN PEDIATRIC BOTTLE 3CC  Final   Culture NO  GROWTH 3 DAYS  Final   Report Status PENDING  Incomplete         Radiology Studies: No results found.      Scheduled Meds: . aspirin  81 mg Oral Daily  . famotidine (PEPCID) IV  20 mg Intravenous Q24H  . heparin  5,000 Units Subcutaneous Q8H  . insulin aspart  0-15 Units Subcutaneous TID WC  . insulin aspart  0-5 Units Subcutaneous QHS  . ketorolac  1 drop Left Eye QID  . mouth rinse  15 mL Mouth Rinse BID  . metoprolol  2.5 mg Intravenous Q6H  . piperacillin-tazobactam (ZOSYN)  IV  3.375 g Intravenous Q8H  . sodium chloride flush  3 mL Intravenous Q12H   Continuous Infusions:   LOS: 6 days    Time spent: 55mn    PDomenic Polite MD [facility] Pager [phone] If 7PM-7AM, please contact night-coverage [email or web address] Password TSt[email or web address]/26/2017, 11:31 AM

## 2016-09-19 ENCOUNTER — Encounter (HOSPITAL_COMMUNITY): Payer: Self-pay | Admitting: Interventional Radiology

## 2016-09-19 ENCOUNTER — Inpatient Hospital Stay (HOSPITAL_COMMUNITY): Payer: Medicare Other

## 2016-09-19 HISTORY — PX: IR GENERIC HISTORICAL: IMG1180011

## 2016-09-19 LAB — GLUCOSE, CAPILLARY
GLUCOSE-CAPILLARY: 167 mg/dL — AB (ref 65–99)
GLUCOSE-CAPILLARY: 131 mg/dL — AB (ref 65–99)
Glucose-Capillary: 130 mg/dL — ABNORMAL HIGH (ref 65–99)
Glucose-Capillary: 167 mg/dL — ABNORMAL HIGH (ref 65–99)

## 2016-09-19 LAB — CULTURE, BLOOD (ROUTINE X 2)
CULTURE: NO GROWTH
CULTURE: NO GROWTH

## 2016-09-19 MED ORDER — BOOST / RESOURCE BREEZE PO LIQD
1.0000 | Freq: Two times a day (BID) | ORAL | Status: DC
  Administered 2016-09-20 – 2016-09-27 (×12): 1 via ORAL

## 2016-09-19 MED ORDER — IOPAMIDOL (ISOVUE-300) INJECTION 61%
INTRAVENOUS | Status: AC
Start: 2016-09-19 — End: 2016-09-19
  Administered 2016-09-19: 5 mL
  Filled 2016-09-19: qty 50

## 2016-09-19 NOTE — Procedures (Signed)
Interventional Radiology Procedure Note  Procedure: Drain injection of abscess drain into diverticular abscess demonstrates small residual space/abscess.  No evidence of fistula.  Free contrast into the right pelvis.   There has been 0 output recorded in the drain over the past 24-48 hours.  .  Complications: None Recommendations:  - Will discuss the result of injection with surgery. - Continue current care - If patient requires drain beyond admission, VIR drain clinic can see in follow up after DC.  [phone]   Signed,  Dulcy Fanny. Earleen Newport, DO

## 2016-09-19 NOTE — Progress Notes (Signed)
Report called to RN on 2W

## 2016-09-19 NOTE — Progress Notes (Signed)
Patient Name: [name] Date of Encounter: 09/19/2016  Primary Cardiologist: [facility], Dr. Ellyn Hack.  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 Denies chest pain.   Inpatient Medications    Scheduled Meds: . aspirin  81 mg Oral Daily  . famotidine (PEPCID) IV  20 mg Intravenous Q24H  . heparin  5,000 Units Subcutaneous Q8H  . insulin aspart  0-15 Units Subcutaneous TID WC  . insulin aspart  0-5 Units Subcutaneous QHS  . ketorolac  1 drop Left Eye QID  . lisinopril  2.5 mg Oral BID  . mouth rinse  15 mL Mouth Rinse BID  . metoprolol  2.5 mg Intravenous Q6H  . piperacillin-tazobactam (ZOSYN)  IV  3.375 g Intravenous Q8H  . sodium chloride flush  3 mL Intravenous Q12H   Continuous Infusions:  PRN Meds: sodium chloride, acetaminophen **OR** acetaminophen, bisacodyl, fentaNYL (SUBLIMAZE) injection, HYDROcodone-acetaminophen, levalbuterol, magnesium citrate, ondansetron **OR** ondansetron (ZOFRAN) IV   Vital Signs    Vitals:   09/19/16 0900 09/19/16 0933 09/19/16 1000 09/19/16 1100  BP:  137/66  124/68  Pulse: 72  87 92  Resp: '14  17 18  '$ Temp:    97.5 F (36.4 C)  TempSrc:    Oral  SpO2: 98%  97% 98%  Weight:      Height:        Intake/Output Summary (Last 24 hours) at 09/19/16 1302 Last data filed at 09/19/16 1000  Gross per 24 hour  Intake            982.5 ml  Output              375 ml  Net            607.5 ml   Filed Weights   09/17/16 0500 09/18/16 0315 09/19/16 0325    Weight: 186 lb 1.1 oz (84.4 kg) 171 lb 1.2 oz (77.6 kg) 171 lb 4.8 oz (77.7 kg)    Physical Exam    GEN: Well nourished, well developed, obese female in no acute distress.  HEENT: Grossly normal.  Neck: Supple, no JVD, carotid bruits, or masses. Cardiac: RRR, no murmurs, rubs, or gallops. No clubbing, cyanosis, edema.  Radials/DP/PT 2+ and equal bilaterally.  Respiratory:  Respirations regular and unlabored, diffuse rhonchi GI: Soft, nontender, nondistended, BS + x 4. MS: no deformity or atrophy. Skin: warm and dry, no rash. Neuro:  Strength and sensation are intact. Psych: AAOx3.  Normal affect.  Labs    CBC  Recent Labs  09/17/16 0500 09/18/16 0509  WBC 9.4 8.9  HGB 11.2* 10.2*  HCT 33.8* 31.1*  MCV 89.4 90.1  PLT 175 027   Basic Metabolic Panel  Recent Labs  09/17/16 0500 09/18/16 0508  NA 140 140  K 3.0* 3.7  CL 102 100*  CO2 29 33*  GLUCOSE 122* 115*  BUN 10 11  CREATININE 1.02* 0.95  CALCIUM 8.4* 8.3*  MG 2.1  --    Cardiac Enzymes  Recent Labs  09/18/16 1100  TROPONINI 0.03*     Telemetry    NSR - Personally Reviewed  ECG     NSR, wide QRS, inverted T waves diffusely- Personally Reviewed  Radiology    Ir Sinus/fist Tube Chk-non Gi  Result Date: 09/19/2016 INDICATION: 70 year old female with a history of diverticular abscess, status post drainage. EXAM: SINUS TRACT INJECTION/FISTULOGRAM MEDICATIONS: The patient is currently admitted to the hospital and receiving intravenous antibiotics. The antibiotics were administered within an appropriate time frame prior to the initiation of the procedure. ANESTHESIA/SEDATION: None COMPLICATIONS: None PROCEDURE: Informed written consent was obtained from the patient after a thorough discussion of the procedural risks, benefits and alternatives. All questions were addressed. Maximal Sterile Barrier Technique was utilized. Time out was performed. The indwelling pelvis drain was injected under fluoroscopy.  Spot images were acquired in multiple obliquities. Catheter was attached to drainage bag. FINDINGS: No fistula identified. Free contrast within the pelvis with no large residual collection. IMPRESSION: Status post drain injection demonstrating no fistula or residual large pelvic collection. Signed, Dulcy Fanny. Earleen Newport, DO [facility] Radiology Electronically Signed   By: Corrie Mckusick D.O.   On: 09/19/2016 09:51    Cardiac Studies   Left Heart Cath and Coronary Angiography  There is severe left ventricular systolic dysfunction.  LV end diastolic pressure is moderately elevated.  There is trivial (1+) mitral regurgitation.  Ost Cx to Prox Cx lesion, 10 %stenosed.  1st Diag lesion, 25 %stenosed.   Mild nonobstructive CAD with 85% smooth narrowing in the first diagonal branch of the LAD; 10% smooth narrowing of the ostial proximal left circumflex coronary artery; and a normal dominant RCA.  Severe LV dysfunction with more pronounced mid, distal anterolateral, apical and distal inferior hypocontractility suggestive of a Takotsubo cardiomyopathy pattern with an ejection fraction of 20-25%.  RECOMMENDATION: Medical therapy.  With the patient's episode of torsade du p[ointes avoid medications with potential for QTC prolongation.   Patient Profile     70 year old female with a past medical history significant for nonobstructive CAD (cath 2006 and again in 2013 with normal LV function, mild, non-obstructive CAD noted), HTN, DM 2, HLD, COPD, former smoker,Hx of non-small cell lung cater s/p chemo and radiation, hx of diverticulitis who initially presented on 11/19 with LLQ pain and found to have diverticulitis with abscess formation. Drain placed. 11/22 am when into cardiac arrest - [place] - VF-VT. Intubated and transferred to the ICU.   Review of tele shows that she initially went into polymorphic VT without long short coupling Had  ROSC in 6-8 minutes. Curiously Mag was 2.1 following the code.  Initial trop 0.12. EKG with no acute ST changes. Nonspecific IVCD new since last tracing. ECHO with EF 30-35% and mid-distal anteroseptal akinesis. No RV strain noted. Previously no know history of LV dysfunction.   CT scan of the abdomen done yesterday showed improving clinical findings. 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  Assessment & Plan    1. Cardiac arrest with polymorphic VT: Seen by EP, there is borderline QT prolongation but no long short coupling. No further VT.   She underwent left heart cath that showed now obstructive CAD, takotsubo cardiomyopathy. EF is 25%.   2. Diverticular abscess: Per primary team, drain removed today.   3. Severe LV dysfunction: Started on ACE-I, also on metoprolol. Continue same.   4. HTN: Well controlled on current regimen.     Signed, Arbutus Leas,  NP  09/19/2016, 1:02 PM  Patient seen and examined and history reviewed. Agree with above findings and plan. No further arrhythmia seen. No overt CHF. Clinically improved from diverticulitis. Drain out today. Will continue ACEi and beta blocker. Plan to repeat Echo in 6-8 weeks.   Peter Martinique, [place] 09/19/2016 2:27 PM

## 2016-09-19 NOTE — Progress Notes (Signed)
PROGRESS NOTE    [name]  ZOX:[id number] DOB: June 23, 1946 DOA: 09/11/2016   PCP: [name], MD   Brief Narrative:  70 y.o. female with hx diverticulitis, CAD, COPD, DM, HTN, hx NSCLC in 2012, initially presented 11/19 with abd pain.  Initial w/u revealed diverticulitis with abscess.  She was seen by surgery, started on zosyn and had IR drain placed 11/20.  She was improving slowly until 11/22 when she was found to be in SVT 190's, then subsequently had PEA arrest with total 6 mins CPR, Defib x 2 for VFib. Intubated during code, awake post arrest but remained hypotensive.   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 Now on diuretics.  Assessment & Plan:   Diverticular abscess - on IV Zosyn Day 6 - CCS consulting - s/p Drain 11/20 - Cx with Klebsiella, sensitive to Zosyn - pt will need colonoscopy 6 weeks post discharge  - per IR, Drain injection of abscess drain into diverticular abscess demonstrates small residual space/abscess, no evidence of fistula  - there has been zero output recorded over the past 24-48 hours   PEA arrest with ventricular fibrillation (HCC) - total 6 mins CPR, Defib x 2 for VFib, intubated during code - Cards consulting - EF 25% now - Cath 11/22 Cardiac catheterization  showed nonobstructive coronary disease but with 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 - Cards and EP following - diuretics managed by cardiology     DM (diabetes mellitus), type 2, uncontrolled with complications ([place]) - on SSI now - on insulin 70/30 and metformin at home but held here until oral intake improves     Acute respiratory failure ([place]) - s/p PEA arrest, extubated 11/23 - improved - wean O2 as tolerated    Hypokalemia - supplemented and WNL this AM     Acute kidney injury  - resolved  - monitor with BMP    Hypertension, essential  - On  lisinopril and metoprolol and BP reasonably controlled     COPD - stable, nebs PRN  DVT prophylaxis: Hep SQ Code Status: Full COde Family Communication: none at bedside Disposition Plan: ready when consultants clear   Consultants:   CCS  IR  Cards  EP  Procedures:  Zosyn 11/19 -->  Subjective: Still having lower abd pain, passing gas, minimal output from drain  Objective: Vitals:   09/19/16 0734 09/19/16 0800 09/19/16 0900 09/19/16 0933  BP: (!) 124/54   137/66  Pulse: 67 73 72   Resp: '15 13 14   '$ Temp: 97.8 F (36.6 C)     TempSrc: Oral     SpO2: 98% 98% 98%   Weight:      Height:        Intake/Output Summary (Last 24 hours) at 09/19/16 1014 Last data filed at 09/19/16 0942  Gross per 24 hour  Intake            974.5 ml  Output              575 ml  Net            399.5 ml   Filed Weights   09/17/16 0500 09/18/16 0315 09/19/16 0325  Weight: 84.4 kg (186 lb 1.1 oz) 77.6 kg (171 lb 1.2 oz) 77.7 kg (171 lb 4.8 oz)    Examination:  General exam: Appears calm and comfortable, AAOx3, no distress today  Respiratory system: faint basilar crackles.  Cardiovascular system: S1 & S2 heard, RRR.  Gastrointestinal system: Abdomen is nondistended, soft and tender in LLQ, drain noted, minimal output Central nervous system: Alert and oriented. No focal neurological deficits. Extremities: Symmetric 5 x 5 power. Skin: No rashes, lesions or ulcers  Data Reviewed: I have personally reviewed following labs and imaging studies  CBC:  Recent Labs Lab 09/14/16 2020 09/15/16 0450 09/16/16 0435 09/17/16 0500 09/18/16 0509  WBC 12.0* 11.9* 9.4 9.4 8.9  NEUTROABS  --   --  7.0  --   --   HGB 10.8* 10.6* 9.9* 11.2* 10.2*  HCT 33.9* 33.1* 29.9* 33.8* 31.1*  MCV 93.1 90.4 90.1 89.4 90.1  PLT 247 219 153 175 950   Basic Metabolic Panel:  Recent Labs Lab 09/14/16 0716 09/14/16 0747 09/14/16 2020 09/15/16 0450 09/16/16 0434 09/16/16 0435 09/17/16 0500  09/18/16 0508  NA 143 144  --  137 142  --  140 140  K 3.3* 3.3*  --  3.8 3.5  --  3.0* 3.7  CL 111 108  --  109 112*  --  102 100*  CO2 19*  --   --  22 22  --  29 33*  GLUCOSE 228* 216*  --  149* 107*  --  122* 115*  BUN 8 8  --  11 11  --  10 11  CREATININE 1.00 0.80 1.16* 1.09* 1.07*  --  1.02* 0.95  CALCIUM 9.4  --   --  8.2* 8.2*  --  8.4* 8.3*  MG 2.1  --   --  1.8  --  1.7 2.1  --   PHOS  --   --   --   --  3.1  --   --   --    Liver Function Tests:  Recent Labs Lab 09/16/16 0434  ALBUMIN 2.3*   Coagulation Profile:  Recent Labs Lab 09/15/16 0450  INR 1.29   Cardiac Enzymes:  Recent Labs Lab 09/14/16 0716 09/14/16 2020 09/18/16 1100  TROPONINI 0.12* 0.52* 0.03*   CBG:  Recent Labs Lab 09/18/16 0831 09/18/16 1150 09/18/16 1640 09/18/16 2141 09/19/16 0736  GLUCAP 125* 189* 140* 140* 130*   Urine analysis:    Component Value Date/Time   COLORURINE YELLOW 09/14/2016 1031   APPEARANCEUR HAZY (A) 09/14/2016 1031   LABSPEC 1.011 09/14/2016 1031   PHURINE 7.0 09/14/2016 1031   GLUCOSEU 100 (A) 09/14/2016 1031   HGBUR LARGE (A) 09/14/2016 1031   BILIRUBINUR NEGATIVE 09/14/2016 1031   KETONESUR NEGATIVE 09/14/2016 1031   PROTEINUR 100 (A) 09/14/2016 1031   UROBILINOGEN 1.0 02/03/2013 0912   NITRITE NEGATIVE 09/14/2016 1031   LEUKOCYTESUR NEGATIVE 09/14/2016 1031   Recent Results (from the past 240 hour(s))  Culture, blood (Routine X 2) w Reflex to ID Panel     Status: None   Collection Time: 09/11/16 11:50 AM  Result Value Ref Range Status   Specimen Description BLOOD RIGHT ANTECUBITAL  Final   Special Requests BOTTLES DRAWN AEROBIC AND ANAEROBIC 5CC  Final   Culture NO GROWTH 5 DAYS  Final   Report Status 09/16/2016 FINAL  Final  Culture, blood (Routine X 2) w Reflex to ID Panel     Status: None   Collection Time: 09/11/16 11:55 AM  Result Value Ref Range Status   Specimen Description BLOOD RIGHT HAND  Final   Special Requests BOTTLES DRAWN  AEROBIC AND ANAEROBIC 5CC  Final   Culture NO GROWTH 5 DAYS  Final  Report Status 09/16/2016 FINAL  Final  Urine culture     Status: Abnormal   Collection Time: 09/11/16  3:36 PM  Result Value Ref Range Status   Specimen Description URINE, RANDOM  Final   Special Requests NONE  Final   Culture MULTIPLE SPECIES PRESENT, SUGGEST RECOLLECTION (A)  Final   Report Status 09/12/2016 FINAL  Final  Aerobic/Anaerobic Culture (surgical/deep wound)     Status: None   Collection Time: 09/12/16 12:52 PM  Result Value Ref Range Status   Specimen Description ABSCESS  Final   Special Requests DIVERTICULAR ABSCESS  Final   Gram Stain   Final    MODERATE WBC PRESENT,BOTH PMN AND MONONUCLEAR RARE GRAM NEGATIVE RODS    Culture   Final    RARE KLEBSIELLA PNEUMONIAE MIXED ANAEROBIC FLORA PRESENT.  CALL LAB IF FURTHER IID REQUIRED.    Report Status 09/17/2016 FINAL  Final   Organism ID, Bacteria KLEBSIELLA PNEUMONIAE  Final      Susceptibility   Klebsiella pneumoniae - MIC*    AMPICILLIN 16 RESISTANT Resistant     CEFAZOLIN <=4 SENSITIVE Sensitive     CEFEPIME <=1 SENSITIVE Sensitive     CEFTAZIDIME <=1 SENSITIVE Sensitive     CEFTRIAXONE <=1 SENSITIVE Sensitive     CIPROFLOXACIN <=0.25 SENSITIVE Sensitive     GENTAMICIN <=1 SENSITIVE Sensitive     IMIPENEM <=0.25 SENSITIVE Sensitive     TRIMETH/SULFA <=20 SENSITIVE Sensitive     AMPICILLIN/SULBACTAM <=2 SENSITIVE Sensitive     PIP/TAZO <=4 SENSITIVE Sensitive     Extended ESBL NEGATIVE Sensitive     * RARE KLEBSIELLA PNEUMONIAE  Surgical pcr screen     Status: None   Collection Time: 09/14/16  8:48 AM  Result Value Ref Range Status   MRSA, PCR NEGATIVE NEGATIVE Final   Staphylococcus aureus NEGATIVE NEGATIVE Final  MRSA PCR Screening     Status: None   Collection Time: 09/14/16  9:03 AM  Result Value Ref Range Status   MRSA by PCR NEGATIVE NEGATIVE Final  Culture, blood (Routine X 2) w Reflex to ID Panel     Status: None (Preliminary  result)   Collection Time: 09/14/16 10:38 AM  Result Value Ref Range Status   Specimen Description BLOOD RIGHT ARM  Final   Special Requests BOTTLES DRAWN AEROBIC AND ANAEROBIC 5CC EACH  Final   Culture NO GROWTH 4 DAYS  Final   Report Status PENDING  Incomplete  Culture, blood (Routine X 2) w Reflex to ID Panel     Status: None (Preliminary result)   Collection Time: 09/14/16 10:48 AM  Result Value Ref Range Status   Specimen Description BLOOD RIGHT ARM  Final   Special Requests IN PEDIATRIC BOTTLE 3CC  Final   Culture NO GROWTH 4 DAYS  Final   Report Status PENDING  Incomplete     Radiology Studies: Ir Sinus/fist Tube Chk-non Gi Result Date: 09/19/2016 INDICATION:  Status post drain injection demonstrating no fistula or residual large pelvic collection.    Scheduled Meds: . aspirin  81 mg Oral Daily  . famotidine (PEPCID) IV  20 mg Intravenous Q24H  . heparin  5,000 Units Subcutaneous Q8H  . insulin aspart  0-15 Units Subcutaneous TID WC  . insulin aspart  0-5 Units Subcutaneous QHS  . ketorolac  1 drop Left Eye QID  . lisinopril  2.5 mg Oral BID  . mouth rinse  15 mL Mouth Rinse BID  . metoprolol  2.5 mg Intravenous Q6H  .  piperacillin-tazobactam (ZOSYN)  IV  3.375 g Intravenous Q8H  . sodium chloride flush  3 mL Intravenous Q12H   Continuous Infusions:   LOS: 7 days   Time spent: 35 min  Faye Ramsay, MD  [facility] Pager [phone]  If 7PM-7AM, please contact night-coverage [email or web address] Password [email or web address] 09/19/2016, 10:14 AM

## 2016-09-19 NOTE — Progress Notes (Signed)
PT Cancellation Note  Patient Details Name: [name] MRN: [id number] DOB: Aug 05, 1946   Cancelled Treatment:    Reason Eval/Treat Not Completed: Patient at procedure or test/unavailable   Duncan Dull 09/19/2016, 8:32 AM Alben Deeds, PT DPT  [phone]

## 2016-09-19 NOTE — Progress Notes (Signed)
IR follow up on drain. Injected earlier this am, see report Discussed with CCS team, okay to pull drain.  Drain removed at bedside without difficulty. Dry dressing applied  Ascencion Dike PA-C Interventional Radiology 09/19/2016 12:30 PM

## 2016-09-19 NOTE — Progress Notes (Signed)
[name] transported to IR for drain injection and possible removal. RN aware.

## 2016-09-19 NOTE — Progress Notes (Signed)
5 Days Post-Op  Subjective: Pt states minimal abdominal pain. No fever or chills. No BM, + flatulence. N & V after breakfast. Minimal drainage output  Objective: Vital signs in last 24 hours: Temp:  [97.7 F (36.5 C)-98.4 F (36.9 C)] 97.8 F (36.6 C) (11/27 0734) Pulse Rate:  [64-89] 72 (11/27 0900) Resp:  [13-24] 14 (11/27 0900) BP: (95-143)/(54-74) 124/54 (11/27 0734) SpO2:  [95 %-100 %] 98 % (11/27 0900) Weight:  [171 lb 4.8 oz (77.7 kg)] 171 lb 4.8 oz (77.7 kg) (11/27 0325) Last BM Date: 09/13/16  Intake/Output from previous day: 11/26 0701 - 11/27 0700 In: 274.5 [P.O.:12; IV Piggyback:262.5] Out: 525 [Urine:425; Stool:100] Intake/Output this shift: Total I/O In: 800 [I.V.:800] Out: -   General appearance: alert, cooperative, no distress Resp: rhonchi bilaterally, effort normal Cardio: regular rate and rhythm GI: Soft, +BS, nontender, mildly distended, minimal bloody drainage noted in drain Pulses: radial 2+ and symmetric  Lab Results:   Recent Labs  09/17/16 0500 09/18/16 0509  WBC 9.4 8.9  HGB 11.2* 10.2*  HCT 33.8* 31.1*  PLT 175 174    BMET  Recent Labs  09/17/16 0500 09/18/16 0508  NA 140 140  K 3.0* 3.7  CL 102 100*  CO2 29 33*  GLUCOSE 122* 115*  BUN 10 11  CREATININE 1.02* 0.95  CALCIUM 8.4* 8.3*   PT/INR No results for input(s): LABPROT, INR in the last 72 hours.   Recent Labs Lab 09/16/16 0434  ALBUMIN 2.3*     Lipase     Component Value Date/Time   LIPASE 29 09/11/2016 1030     Studies/Results: No results found.  Medications: . aspirin  81 mg Oral Daily  . famotidine (PEPCID) IV  20 mg Intravenous Q24H  . heparin  5,000 Units Subcutaneous Q8H  . insulin aspart  0-15 Units Subcutaneous TID WC  . insulin aspart  0-5 Units Subcutaneous QHS  . ketorolac  1 drop Left Eye QID  . lisinopril  2.5 mg Oral BID  . mouth rinse  15 mL Mouth Rinse BID  . metoprolol  2.5 mg Intravenous Q6H  . piperacillin-tazobactam (ZOSYN)  IV   3.375 g Intravenous Q8H  . sodium chloride flush  3 mL Intravenous Q12H    Assessment/Plan Diverticulitis with abscess S/P perc drain, abscess resolved on CT, continue ABX. Continue clear liquids until tenderness and N&V improves. WBC normal. If pain persists, may need repeat CT next week. - per IR, Drain injection of abscess drain into diverticular abscess demonstrates small residual space/abscess.  No evidence of fistula.  Free contrast into the right pelvis. Spoke with Dr. Earleen Newport who will remove the drain if surgery team is ok with it.  - culture Klebsiella pneumoniae sensitive to Zosyn - will need colonoscopy 6 weeks after discharge  COPD with recent exacerbation in the setting of recent PNA , history of limited stage SCLCa , currently stable  Cardiac arrest/CHF: per cardiology DM HLD GERD  ID - zosyn11/19>> VTE - lovenox FEN - carb modified, clears  Plan - continue drain and IV antibiotics. Pain improving. N&V today. Will keep on clears.   LOS: 7 days    Kalman Drape 09/19/2016 [phone]

## 2016-09-19 NOTE — Care Management Important Message (Signed)
Important Message  Patient Details  Name: [name] MRN: [id number] Date of Birth: 02-19-1946   Medicare Important Message Given:  Yes    Nathen May 09/19/2016, 11:53 AM

## 2016-09-19 NOTE — Progress Notes (Signed)
Nutrition Follow-up  DOCUMENTATION CODES:   Obesity unspecified  INTERVENTION:   -Boost Breeze po BID, each supplement provides 250 kcal and 9 grams of protein  NUTRITION DIAGNOSIS:   Inadequate oral intake related to altered GI function as evidenced by per patient/family report.  Ongoing  GOAL:   Patient will meet greater than or equal to 90% of their needs  Progressing  MONITOR:   PO intake, Supplement acceptance, Diet advancement, Labs, Weight trends, Skin, I & O's  REASON FOR ASSESSMENT:   Ventilator    ASSESSMENT:   70 year old female with a past medical history significant for nonobstructive CAD (cath 2006 and again in 2013 with normal LV function, mild, non-obstructive CAD noted), HTN, DM 2, HLD, COPD, former smoker, Hx of non-small cell lung cater s/p chemo and radiation, hx of diverticulitis who initially presented on 11/19 with LLQ pain and found to have diverticulitis with abscess formation. Drain placed. 11/22 am when into cardiac arrest. Intubated and transferred to the ICU.   Pt extubated on 09/15/16.   Pt remains on clear liquids due to nausea and vomiting. Noted pt was advanced to full liquids after lunch.   IR following due to drain for diverticular abscess, which was removed today due to minimal output.   Pt wound benefit from supplements secondary to minimal intake within the past 5 days. RD will try Boost Breeze due to recent nausea and vomiting.   Labs reviewed: CBGS: 130-167.   Diet Order:  Diet full liquid Room service appropriate? Yes; Fluid consistency: Thin  Skin:  Reviewed, no issues  Last BM:  09/13/16  Height:   Ht Readings from Last 1 Encounters:  09/14/16 '5\' 2"'$  (1.575 m)    Weight:   Wt Readings from Last 1 Encounters:  09/19/16 171 lb 4.8 oz (77.7 kg)    Ideal Body Weight:  50 kg  BMI:  Body mass index is 31.33 kg/m.  Estimated Nutritional Needs:   Kcal:  1700-1900  Protein:  85-100 grams  Fluid:  1.7-1.9  L  EDUCATION NEEDS:   No education needs identified at this time  Ramsha Lonigro A. Jimmye Norman, RD, LDN, CDE Pager: [phone] After hours Pager: [phone]

## 2016-09-19 NOTE — Progress Notes (Signed)
Patient lying in bed. Patient will tell you she has to use the bathroom and in the same sentence say it is too late. States that she was not incontinent prior to hospitalization. States she does not feel it until it she begins to urinate.  Pain medication given, call light within reach. No other needs at this time.

## 2016-09-20 ENCOUNTER — Inpatient Hospital Stay (HOSPITAL_COMMUNITY): Payer: Medicare Other

## 2016-09-20 ENCOUNTER — Encounter (HOSPITAL_COMMUNITY): Payer: Self-pay | Admitting: Radiology

## 2016-09-20 DIAGNOSIS — Z794 Long term (current) use of insulin: Secondary | ICD-10-CM

## 2016-09-20 DIAGNOSIS — E1165 Type 2 diabetes mellitus with hyperglycemia: Secondary | ICD-10-CM

## 2016-09-20 LAB — GLUCOSE, CAPILLARY
GLUCOSE-CAPILLARY: 136 mg/dL — AB (ref 65–99)
GLUCOSE-CAPILLARY: 117 mg/dL — AB (ref 65–99)
GLUCOSE-CAPILLARY: 162 mg/dL — AB (ref 65–99)
Glucose-Capillary: 216 mg/dL — ABNORMAL HIGH (ref 65–99)

## 2016-09-20 LAB — BASIC METABOLIC PANEL
Anion gap: 6 (ref 5–15)
BUN: 11 mg/dL (ref 6–20)
CALCIUM: 8.4 mg/dL — AB (ref 8.9–10.3)
CHLORIDE: 103 mmol/L (ref 101–111)
CO2: 31 mmol/L (ref 22–32)
CREATININE: 0.89 mg/dL (ref 0.44–1.00)
GFR calc Af Amer: >60 mL/min (ref >60)
GFR calc non Af Amer: >60 mL/min (ref >60)
Glucose, Bld: 123 mg/dL — ABNORMAL HIGH (ref 65–99)
Potassium: 3.8 mmol/L (ref 3.5–5.1)
SODIUM: 140 mmol/L (ref 135–145)

## 2016-09-20 LAB — CBC
HCT: 30.5 % — ABNORMAL LOW (ref 36.0–46.0)
HEMOGLOBIN: 9.9 g/dL — AB (ref 12.0–15.0)
MCH: 29.4 pg (ref 26.0–34.0)
MCHC: 32.5 g/dL (ref 30.0–36.0)
MCV: 90.5 fL (ref 78.0–100.0)
PLATELETS: 161 10*3/uL (ref 150–400)
RBC: 3.37 MIL/uL — ABNORMAL LOW (ref 3.87–5.11)
RDW: 14.9 % (ref 11.5–15.5)
WBC: 12.8 10*3/uL — ABNORMAL HIGH (ref 4.0–10.5)

## 2016-09-20 MED ORDER — FAMOTIDINE 20 MG PO TABS
20.0000 mg | ORAL_TABLET | Freq: Every day | ORAL | Status: DC
  Administered 2016-09-21 – 2016-09-27 (×7): 20 mg via ORAL
  Filled 2016-09-20 (×8): qty 1

## 2016-09-20 MED ORDER — IOPAMIDOL (ISOVUE-300) INJECTION 61%
INTRAVENOUS | Status: AC
  Administered 2016-09-20: 100 mL
  Filled 2016-09-20: qty 100

## 2016-09-20 MED ORDER — IOPAMIDOL (ISOVUE-300) INJECTION 61%
INTRAVENOUS | Status: AC
  Filled 2016-09-20: qty 30

## 2016-09-20 MED ORDER — SODIUM CHLORIDE 0.9% FLUSH
10.0000 mL | INTRAVENOUS | Status: DC | PRN
  Administered 2016-09-21: 10 mL
  Filled 2016-09-20: qty 40

## 2016-09-20 MED ORDER — METOPROLOL TARTRATE 25 MG PO TABS
25.0000 mg | ORAL_TABLET | Freq: Two times a day (BID) | ORAL | Status: DC
  Administered 2016-09-20 – 2016-09-21 (×3): 25 mg via ORAL
  Filled 2016-09-20 (×3): qty 1

## 2016-09-20 MED ORDER — SODIUM CHLORIDE 0.9% FLUSH
10.0000 mL | Freq: Two times a day (BID) | INTRAVENOUS | Status: DC
  Administered 2016-09-20 – 2016-09-21 (×2): 10 mL
  Administered 2016-09-21: 20 mL

## 2016-09-20 NOTE — Progress Notes (Signed)
PT Cancellation Note  Patient Details Name: [name] MRN: [id number] DOB: 1945-11-10   Cancelled Treatment:    Reason Eval/Treat Not Completed: Patient at procedure or test/unavailable. Pt was vomiting earlier today and now leaving for a procedure. PT to return as able.   [name] 09/20/2016, 2:52 PM   Kittie Plater, PT, DPT Pager #: [phone] Office #: [phone]

## 2016-09-20 NOTE — Progress Notes (Addendum)
PROGRESS NOTE    [name]  ION:[id number] DOB: Nov 22, 1945 DOA: 09/11/2016   PCP: [name], MD   Brief Narrative:  70 y.o. female with hx diverticulitis, CAD, COPD, DM, HTN, hx NSCLC in 2012, initially presented 11/19 with abd pain.  Initial w/u revealed diverticulitis with abscess.  She was seen by surgery, started on zosyn and had IR drain placed 11/20.  She was improving slowly until 11/22 when she was found to be in SVT 190's, then subsequently had PEA arrest with total 6 mins CPR, Defib x 2 for VFib. Intubated during code, awake post arrest but remained hypotensive.   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  Assessment & Plan:     Diverticular abscess - on IV Zosyn Day 10/14 - [facility] consulting, plan to continue 1-2 more days of IV ABX and change to PO to complete therapy  - s/p Drain 11/20 - Cx with Klebsiella, sensitive to Zosyn - pt will need colonoscopy 6 weeks post discharge  - per IR, on 11/27 --> drain injection of abscess drain into diverticular abscess demonstrates small residual space/abscess, no evidence of fistula, IR removed drain  - there has been zero output recorded over the past 24-48 hours  - advance diet to low fiber    Leukocytosis  - WBC have been stable overall but up this AM from 8.9 --> 12.8 (11/28) - unclear etiology, pt with no fever, VSS - will keep close eye on this  - pt is already on Zosyn as noted above which based on sensitivity report should be adequate  - CBC in AM    PEA arrest with ventricular fibrillation (HCC) - total 6 mins CPR, Defib x 2 for VFib, intubated during code - Cards consulting - EF 25% now - Cath 11/22 Cardiac catheterization  showed nonobstructive coronary disease but with 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 - Cards and EP following, recommend continuing ACE-i (lisinopril) and Metoprolol  -  per cardiology, will need repeat ECHO in 6-8 weeks     DM (diabetes mellitus), type 2, uncontrolled with long term use insulin (HCC) - on SSI now - on insulin 70/30 and metformin at home but held here until oral intake improves     Acute respiratory failure ([place]) - s/p PEA arrest, extubated 11/23 - improved - continue to wean O2 as tolerated    Hypokalemia - supplemented and WNL this AM  - BMP In AM    Acute kidney injury  - resolved  - monitor with BMP    Hypertension, essential  - On lisinopril and metoprolol and BP reasonably controlled     COPD - stable, nebs PRN  DVT prophylaxis: Hep SQ Code Status: Full  Family Communication: none at bedside Disposition Plan: ready when consultants clear, also PT eval pending   CONSULTANTS:  CCS  IR  Cards  EP  PROCEDURES:  OETT 7.5 11/22 >> 11/23  R FEM ART LINE 11/22 >> 11/24  R IJ CVL 11/22 >>  OGT 11/22 >> 11/23  FOLEY 11/22 >>    STUDIES:   CT abd/pelvis 11/19: Focal diverticulitis of the mid sigmoid colon with adjacent 2.5 x 4.1 cm abscess and foci of pneumoperitoneum in this area. Small amount of complex free fluid within the pelvis.  CT chest/abd/pelvis 11/22: Radiation fibrosis changes in medial RIGHT upper lobe extending to the superior RIGHT hilum. Additional RIGHT upper lobe and scattered  RIGHT lung infiltrates question pneumonia versus less likely asymmetric edema. Small BILATERAL pleural effusions RIGHT greater than LEFT with minimal adjacent compressive atelectasis of the lower lobes. Improved diverticulitis changes with resolution of the previously identified abscess collection adjacent to the sigmoid colon. Fractures of the sternum and the anterior RIGHT third and fifth ribs suspect related to CPR. Small pericardial fusion.  TTE 11/22:  LV EF 30-35% w/ akinesis of mid-apicalanteroseptalmycoardium. Grade 1 diastolic dysfunciton. LA & RA normal in size. RV normal in size & function. Trivial aortic regurg  w/o stenosis. Trivial mitral regurg. No pulmonic regurg. Mild tricuspid regurg. Trivial pericardial effusion.   Cath11/22/2017: Severe LV dysfunction (EF roughly 25%) with moderately-severely elevated LVEDP (25 mmHg). Mild nonobstructive CAD with 85% smooth narrowing of L2 small-caliber D1. -- Suggestive of Takotsubo Cardiomyopathy.  MICROBIOLOGY:  Blood Ctx x2 11/19 >>  Urine Ctx 11/19:  Multiple species present  Diverticular Abscess Ctx 11/20:  Klebsiella pneumoniae (resistant Ampicillin)  MRSA PCR 11/22:  Negative  Blood Ctx x2 11/22 >>  ANTIBIOTICS:  Zosyn 11/19 -->  Vancomycin 11/22 >> 11/24  Eraxis 11/22 >> 11/23  Subjective: Still having lower abd pain, passing gas, minimal output from drain  Objective: Vitals:   09/19/16 1000 09/19/16 1100 09/19/16 1900 09/20/16 0523  BP:  124/68 (!) 151/60 (!) 116/54  Pulse: 87 92 100 85  Resp: '17 18 18 18  '$ Temp:  97.5 F (36.4 C) 97.7 F (36.5 C) 98 F (36.7 C)  TempSrc:  Oral Oral Oral  SpO2: 97% 98% 96% 97%  Weight:    74.8 kg (164 lb 14.4 oz)  Height:        Intake/Output Summary (Last 24 hours) at 09/20/16 0655 Last data filed at 09/19/16 1900  Gross per 24 hour  Intake             1500 ml  Output               50 ml  Net             1450 ml   Filed Weights   09/18/16 0315 09/19/16 0325 09/20/16 0523  Weight: 77.6 kg (171 lb 1.2 oz) 77.7 kg (171 lb 4.8 oz) 74.8 kg (164 lb 14.4 oz)    Examination:  General exam: Appears calm and comfortable, AAOx3, no distress today  Respiratory system: faint basilar crackles. Cardiovascular system: S1 & S2 heard, RRR.  Gastrointestinal system: Abdomen is nondistended, soft, Non tender  Central nervous system: Alert and oriented. No focal neurological deficits. Extremities: Symmetric 5 x 5 power. Skin: No rashes, lesions or ulcers  Data Reviewed: I have personally reviewed following labs and imaging studies  CBC:  Recent Labs Lab 09/14/16 2020 09/15/16 0450  09/16/16 0435 09/17/16 0500 09/18/16 0509  WBC 12.0* 11.9* 9.4 9.4 8.9  NEUTROABS  --   --  7.0  --   --   HGB 10.8* 10.6* 9.9* 11.2* 10.2*  HCT 33.9* 33.1* 29.9* 33.8* 31.1*  MCV 93.1 90.4 90.1 89.4 90.1  PLT 247 219 153 175 962   Basic Metabolic Panel:  Recent Labs Lab 09/14/16 0716 09/14/16 0747 09/14/16 2020 09/15/16 0450 09/16/16 0434 09/16/16 0435 09/17/16 0500 09/18/16 0508  NA 143 144  --  137 142  --  140 140  K 3.3* 3.3*  --  3.8 3.5  --  3.0* 3.7  CL 111 108  --  109 112*  --  102 100*  CO2 19*  --   --  22 22  --  29 33*  GLUCOSE 228* 216*  --  149* 107*  --  122* 115*  BUN 8 8  --  11 11  --  10 11  CREATININE 1.00 0.80 1.16* 1.09* 1.07*  --  1.02* 0.95  CALCIUM 9.4  --   --  8.2* 8.2*  --  8.4* 8.3*  MG 2.1  --   --  1.8  --  1.7 2.1  --   PHOS  --   --   --   --  3.1  --   --   --    Liver Function Tests:  Recent Labs Lab 09/16/16 0434  ALBUMIN 2.3*   Coagulation Profile:  Recent Labs Lab 09/15/16 0450  INR 1.29   Cardiac Enzymes:  Recent Labs Lab 09/14/16 0716 09/14/16 2020 09/18/16 1100  TROPONINI 0.12* 0.52* 0.03*   CBG:  Recent Labs Lab 09/19/16 0736 09/19/16 1126 09/19/16 1714 09/19/16 2127 09/20/16 0638  GLUCAP 130* 167* 167* 131* 136*   Urine analysis:    Component Value Date/Time   COLORURINE YELLOW 09/14/2016 1031   APPEARANCEUR HAZY (A) 09/14/2016 1031   LABSPEC 1.011 09/14/2016 1031   PHURINE 7.0 09/14/2016 1031   GLUCOSEU 100 (A) 09/14/2016 1031   HGBUR LARGE (A) 09/14/2016 1031   BILIRUBINUR NEGATIVE 09/14/2016 1031   KETONESUR NEGATIVE 09/14/2016 1031   PROTEINUR 100 (A) 09/14/2016 1031   UROBILINOGEN 1.0 02/03/2013 0912   NITRITE NEGATIVE 09/14/2016 1031   LEUKOCYTESUR NEGATIVE 09/14/2016 1031   Recent Results (from the past 240 hour(s))  Culture, blood (Routine X 2) w Reflex to ID Panel     Status: None   Collection Time: 09/11/16 11:50 AM  Result Value Ref Range Status   Specimen Description BLOOD  RIGHT ANTECUBITAL  Final   Special Requests BOTTLES DRAWN AEROBIC AND ANAEROBIC 5CC  Final   Culture NO GROWTH 5 DAYS  Final   Report Status 09/16/2016 FINAL  Final  Culture, blood (Routine X 2) w Reflex to ID Panel     Status: None   Collection Time: 09/11/16 11:55 AM  Result Value Ref Range Status   Specimen Description BLOOD RIGHT HAND  Final   Special Requests BOTTLES DRAWN AEROBIC AND ANAEROBIC 5CC  Final   Culture NO GROWTH 5 DAYS  Final   Report Status 09/16/2016 FINAL  Final  Urine culture     Status: Abnormal   Collection Time: 09/11/16  3:36 PM  Result Value Ref Range Status   Specimen Description URINE, RANDOM  Final   Special Requests NONE  Final   Culture MULTIPLE SPECIES PRESENT, SUGGEST RECOLLECTION (A)  Final   Report Status 09/12/2016 FINAL  Final  Aerobic/Anaerobic Culture (surgical/deep wound)     Status: None   Collection Time: 09/12/16 12:52 PM  Result Value Ref Range Status   Specimen Description ABSCESS  Final   Special Requests DIVERTICULAR ABSCESS  Final   Gram Stain   Final    MODERATE WBC PRESENT,BOTH PMN AND MONONUCLEAR RARE GRAM NEGATIVE RODS    Culture   Final    RARE KLEBSIELLA PNEUMONIAE MIXED ANAEROBIC FLORA PRESENT.  CALL LAB IF FURTHER IID REQUIRED.    Report Status 09/17/2016 FINAL  Final   Organism ID, Bacteria KLEBSIELLA PNEUMONIAE  Final      Susceptibility   Klebsiella pneumoniae - MIC*    AMPICILLIN 16 RESISTANT Resistant     CEFAZOLIN <=4 SENSITIVE Sensitive     CEFEPIME <=1 SENSITIVE Sensitive  CEFTAZIDIME <=1 SENSITIVE Sensitive     CEFTRIAXONE <=1 SENSITIVE Sensitive     CIPROFLOXACIN <=0.25 SENSITIVE Sensitive     GENTAMICIN <=1 SENSITIVE Sensitive     IMIPENEM <=0.25 SENSITIVE Sensitive     TRIMETH/SULFA <=20 SENSITIVE Sensitive     AMPICILLIN/SULBACTAM <=2 SENSITIVE Sensitive     PIP/TAZO <=4 SENSITIVE Sensitive     Extended ESBL NEGATIVE Sensitive     * RARE KLEBSIELLA PNEUMONIAE  Surgical pcr screen     Status: None     Collection Time: 09/14/16  8:48 AM  Result Value Ref Range Status   MRSA, PCR NEGATIVE NEGATIVE Final   Staphylococcus aureus NEGATIVE NEGATIVE Final  MRSA PCR Screening     Status: None   Collection Time: 09/14/16  9:03 AM  Result Value Ref Range Status   MRSA by PCR NEGATIVE NEGATIVE Final  Culture, blood (Routine X 2) w Reflex to ID Panel     Status: None (Preliminary result)   Collection Time: 09/14/16 10:38 AM  Result Value Ref Range Status   Specimen Description BLOOD RIGHT ARM  Final   Special Requests BOTTLES DRAWN AEROBIC AND ANAEROBIC 5CC EACH  Final   Culture NO GROWTH 4 DAYS  Final   Report Status PENDING  Incomplete  Culture, blood (Routine X 2) w Reflex to ID Panel     Status: None (Preliminary result)   Collection Time: 09/14/16 10:48 AM  Result Value Ref Range Status   Specimen Description BLOOD RIGHT ARM  Final   Special Requests IN PEDIATRIC BOTTLE 3CC  Final   Culture NO GROWTH 4 DAYS  Final   Report Status PENDING  Incomplete     Radiology Studies: Ir Sinus/fist Tube Chk-non Gi Result Date: 09/19/2016 INDICATION:  Status post drain injection demonstrating no fistula or residual large pelvic collection.    Scheduled Meds: . aspirin  81 mg Oral Daily  . famotidine (PEPCID) IV  20 mg Intravenous Q24H  . feeding supplement  1 Container Oral BID BM  . heparin  5,000 Units Subcutaneous Q8H  . insulin aspart  0-15 Units Subcutaneous TID WC  . insulin aspart  0-5 Units Subcutaneous QHS  . ketorolac  1 drop Left Eye QID  . lisinopril  2.5 mg Oral BID  . mouth rinse  15 mL Mouth Rinse BID  . metoprolol  2.5 mg Intravenous Q6H  . piperacillin-tazobactam (ZOSYN)  IV  3.375 g Intravenous Q8H  . sodium chloride flush  3 mL Intravenous Q12H   Continuous Infusions:   LOS: 8 days   Time spent: 35 min  Faye Ramsay, MD  [facility] Pager [phone]  If 7PM-7AM, please contact night-coverage [email or web address] Password [facility] 09/20/2016, 6:55 AM

## 2016-09-20 NOTE — Progress Notes (Signed)
6 Days Post-Op  Subjective: C/o of chest tenderness. No abdominal pain. Had a BM this morning. No nausea or vomiting. Pt states she doesn't have much of an appetite. No additional complaints.   Objective: Vital signs in last 24 hours: Temp:  [97.5 F (36.4 C)-98 F (36.7 C)] 98 F (36.7 C) (11/28 0523) Pulse Rate:  [85-100] 85 (11/28 0523) Resp:  [17-18] 18 (11/28 0523) BP: (116-151)/(54-68) 116/54 (11/28 0523) SpO2:  [96 %-98 %] 97 % (11/28 0523) Weight:  [164 lb 14.4 oz (74.8 kg)] 164 lb 14.4 oz (74.8 kg) (11/28 0523) Last BM Date: 09/19/16  Intake/Output from previous day: 11/27 0701 - 11/28 0700 In: 1500 [P.O.:480; I.V.:920; IV Piggyback:100] Out: 50 [Urine:50] Intake/Output this shift: No intake/output data recorded.   PE: General appearance: alert, cooperative, no distress Resp: rhonchi and mild inspiratory wheezes bilaterally, effort normal Cardio: regular rate and rhythm, S1 and S2 normal GI: Soft, +BS, nontender, mildly distended, incision from drain without surrounding erythema, edema or discharge Pulses: radial 2+ and symmetric  Lab Results:   Recent Labs  09/18/16 0509 09/20/16 0815  WBC 8.9 12.8*  HGB 10.2* 9.9*  HCT 31.1* 30.5*  PLT 174 161    BMET  Recent Labs  09/18/16 0508 09/20/16 0815  NA 140 140  K 3.7 3.8  CL 100* 103  CO2 33* 31  GLUCOSE 115* 123*  BUN 11 11  CREATININE 0.95 0.89  CALCIUM 8.3* 8.4*   PT/INR No results for input(s): LABPROT, INR in the last 72 hours.   Recent Labs Lab 09/16/16 0434  ALBUMIN 2.3*     Lipase     Component Value Date/Time   LIPASE 29 09/11/2016 1030     Studies/Results: Ir Sinus/fist Tube Chk-non Gi  Result Date: 09/19/2016 INDICATION: 70 year old female with a history of diverticular abscess, status post drainage. EXAM: SINUS TRACT INJECTION/FISTULOGRAM MEDICATIONS: The patient is currently admitted to the hospital and receiving intravenous antibiotics. The antibiotics were administered  within an appropriate time frame prior to the initiation of the procedure. ANESTHESIA/SEDATION: None COMPLICATIONS: None PROCEDURE: Informed written consent was obtained from the patient after a thorough discussion of the procedural risks, benefits and alternatives. All questions were addressed. Maximal Sterile Barrier Technique was utilized. Time out was performed. The indwelling pelvis drain was injected under fluoroscopy. Spot images were acquired in multiple obliquities. Catheter was attached to drainage bag. FINDINGS: No fistula identified. Free contrast within the pelvis with no large residual collection. IMPRESSION: Status post drain injection demonstrating no fistula or residual large pelvic collection. Signed, Dulcy Fanny. Earleen Newport, DO [facility] Radiology Electronically Signed   By: Corrie Mckusick D.O.   On: 09/19/2016 09:51    Medications: . aspirin  81 mg Oral Daily  . famotidine (PEPCID) IV  20 mg Intravenous Q24H  . feeding supplement  1 Container Oral BID BM  . heparin  5,000 Units Subcutaneous Q8H  . insulin aspart  0-15 Units Subcutaneous TID WC  . insulin aspart  0-5 Units Subcutaneous QHS  . ketorolac  1 drop Left Eye QID  . lisinopril  2.5 mg Oral BID  . mouth rinse  15 mL Mouth Rinse BID  . metoprolol  2.5 mg Intravenous Q6H  . piperacillin-tazobactam (ZOSYN)  IV  3.375 g Intravenous Q8H  . sodium chloride flush  10-40 mL Intracatheter Q12H  . sodium chloride flush  3 mL Intravenous Q12H    Assessment/Plan Diverticulitis with abscess S/P perc drain, abscess resolved on  CT, continue ABX, WBC elevated today, will continue to monitor, labs tomorrow AM, +BM, low fiber diet - per IR, drain removed 11/27 - culture Klebsiella pneumoniae sensitive to Zosyn - will need colonoscopy 6 weeks after discharge  COPD with recent exacerbation in the setting of recent PNA , history of limited stage SCLCa , currently stable  Cardiac arrest/CHF:  per cardiology DM HLD GERD  ID - zosyn11/19>> VTE - lovenox FEN - carb modified low fiber  Plan - continue drain and IV antibiotics then PO antibiotics for a total of 14 days. Will likely d/c IV abx tomorrow pending repeat CBC in am to check WBC. Pain improving. No N&V today. Advanced diet to low fiber   LOS: 8 days    Kalman Drape 09/20/2016 [id number]

## 2016-09-20 NOTE — Progress Notes (Signed)
Patient Name: [name] Date of Encounter: 09/20/2016  Primary Cardiologist: [facility], Dr. Ellyn Hack.  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  Denies chest pain. No SOB. Generally feels poorly.  Inpatient Medications    Scheduled Meds: . iopamidol      . aspirin  81 mg Oral Daily  . famotidine (PEPCID) IV  20 mg Intravenous Q24H  . feeding supplement  1 Container Oral BID BM  . heparin  5,000 Units Subcutaneous Q8H  . insulin aspart  0-15 Units Subcutaneous TID WC  . insulin aspart  0-5 Units Subcutaneous QHS  . ketorolac  1 drop Left Eye QID  . lisinopril  2.5 mg Oral BID  . mouth rinse  15 mL Mouth Rinse BID  . metoprolol tartrate  25 mg Oral BID  . piperacillin-tazobactam (ZOSYN)  IV  3.375 g Intravenous Q8H  . sodium chloride flush  10-40 mL Intracatheter Q12H  . sodium chloride flush  3 mL Intravenous Q12H   Continuous Infusions:  PRN Meds: sodium chloride, acetaminophen **OR** acetaminophen, bisacodyl, fentaNYL (SUBLIMAZE) injection, HYDROcodone-acetaminophen, levalbuterol, magnesium citrate, ondansetron **OR** ondansetron (ZOFRAN) IV, sodium chloride flush   Vital Signs    Vitals:   09/19/16 1000 09/19/16 1100 09/19/16 1900 09/20/16 0523  BP:  124/68 (!) 151/60 (!) 116/54  Pulse: 87 92 100 85  Resp: '17 18 18 18  '$ Temp:  97.5 F (36.4 C) 97.7 F (36.5 C) 98 F (36.7 C)  TempSrc:  Oral Oral Oral  SpO2: 97% 98% 96% 97%  Weight:    164 lb 14.4 oz (74.8 kg)    Height:        Intake/Output Summary (Last 24 hours) at 09/20/16 1139 Last data filed at 09/19/16 1900  Gross per 24 hour  Intake              610 ml  Output                0 ml  Net              610 ml   Filed Weights   09/18/16 0315 09/19/16 0325 09/20/16 0523  Weight: 171 lb 1.2 oz (77.6 kg) 171 lb 4.8 oz (77.7 kg) 164 lb 14.4 oz (74.8 kg)    Physical Exam    GEN: Well nourished, well developed, obese female in no acute distress.  HEENT: Grossly normal.  Neck: Supple, no JVD, carotid bruits, or masses. Cardiac: RRR, no murmurs, rubs, or gallops. No clubbing, cyanosis, edema.  Radials/DP/PT 2+ and equal bilaterally.  Respiratory:  Respirations regular and unlabored, diffuse rhonchi GI: Soft, nontender, nondistended, BS + x 4. MS: no deformity or atrophy. Skin: warm and dry, no rash. Neuro:  Strength and sensation are intact. Psych: AAOx3.  Normal affect.  Labs    CBC  Recent Labs  09/18/16 0509 09/20/16 0815  WBC 8.9 12.8*  HGB 10.2* 9.9*  HCT 31.1* 30.5*  MCV 90.1 90.5  PLT 174 413   Basic Metabolic  Panel  Recent Labs  09/18/16 0508 09/20/16 0815  NA 140 140  K 3.7 3.8  CL 100* 103  CO2 33* 31  GLUCOSE 115* 123*  BUN 11 11  CREATININE 0.95 0.89  CALCIUM 8.3* 8.4*   Cardiac Enzymes  Recent Labs  09/18/16 1100  TROPONINI 0.03*     Telemetry    NSR - Personally Reviewed  ECG     NSR, wide QRS, inverted T waves diffusely- Personally Reviewed  Radiology    Ir Sinus/fist Tube Chk-non Gi  Result Date: 09/19/2016 INDICATION: 70 year old female with a history of diverticular abscess, status post drainage. EXAM: SINUS TRACT INJECTION/FISTULOGRAM MEDICATIONS: The patient is currently admitted to the hospital and receiving intravenous antibiotics. The antibiotics were administered within an appropriate time frame prior to the initiation of the procedure. ANESTHESIA/SEDATION: None COMPLICATIONS: None PROCEDURE: Informed written consent was  obtained from the patient after a thorough discussion of the procedural risks, benefits and alternatives. All questions were addressed. Maximal Sterile Barrier Technique was utilized. Time out was performed. The indwelling pelvis drain was injected under fluoroscopy. Spot images were acquired in multiple obliquities. Catheter was attached to drainage bag. FINDINGS: No fistula identified. Free contrast within the pelvis with no large residual collection. IMPRESSION: Status post drain injection demonstrating no fistula or residual large pelvic collection. Signed, Dulcy Fanny. Earleen Newport, DO [facility] Radiology Electronically Signed   By: Corrie Mckusick D.O.   On: 09/19/2016 09:51    Cardiac Studies   Left Heart Cath and Coronary Angiography  There is severe left ventricular systolic dysfunction.  LV end diastolic pressure is moderately elevated.  There is trivial (1+) mitral regurgitation.  Ost Cx to Prox Cx lesion, 10 %stenosed.  1st Diag lesion, 25 %stenosed.   Mild nonobstructive CAD with 85% smooth narrowing in the first diagonal branch of the LAD; 10% smooth narrowing of the ostial proximal left circumflex coronary artery; and a normal dominant RCA.  Severe LV dysfunction with more pronounced mid, distal anterolateral, apical and distal inferior hypocontractility suggestive of a Takotsubo cardiomyopathy pattern with an ejection fraction of 20-25%.  RECOMMENDATION: Medical therapy.  With the patient's episode of torsade du p[ointes avoid medications with potential for QTC prolongation.   Patient Profile     70 year old female with a past medical history significant for nonobstructive CAD (cath 2006 and again in 2013 with normal LV function, mild, non-obstructive CAD noted), HTN, DM 2, HLD, COPD, former smoker,Hx of non-small cell lung cater s/p chemo and radiation, hx of diverticulitis who initially presented on 11/19 with LLQ pain and found  to have diverticulitis with abscess formation. Drain placed. 11/22 am when into cardiac arrest - [place] - VF-VT. Intubated and transferred to the ICU.   Review of tele shows that she initially went into polymorphic VT without long short coupling Had ROSC in 6-8 minutes. Curiously Mag was 2.1 following the code.  Initial trop 0.12. EKG with no acute ST changes. Nonspecific IVCD new since last tracing. ECHO with EF 30-35% and mid-distal anteroseptal akinesis. No RV strain noted. Previously no know history of LV dysfunction.   CT scan of the abdomen done yesterday showed improving clinical findings. 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  Assessment & Plan    1. Cardiac arrest with polymorphic VT: Seen by EP, there is borderline QT prolongation but no long short coupling. No further VT. ? Related to stress induced cardiomyopathy.  She underwent left heart cath that showed now obstructive CAD, takotsubo cardiomyopathy. EF is 25%.   2. Diverticular abscess: Per primary team, drain removed today. Plan follow up CT Abd. Today.  3. Severe LV dysfunction: EF 25%. Most likely stress induced cardiomyopathy. Takotsubo. Started on ACE-I, also on metoprolol IV. Will switch metoprolol to po today. Repeat Echo 6-8 weeks.  4. HTN: Well controlled on current regimen.     Signed,  Peter Martinique, [place] 09/20/2016 11:39 AM

## 2016-09-21 ENCOUNTER — Inpatient Hospital Stay (HOSPITAL_COMMUNITY): Payer: Medicare Other

## 2016-09-21 LAB — BASIC METABOLIC PANEL
ANION GAP: 9 (ref 5–15)
BUN: 8 mg/dL (ref 6–20)
CO2: 29 mmol/L (ref 22–32)
Calcium: 8.5 mg/dL — ABNORMAL LOW (ref 8.9–10.3)
Chloride: 102 mmol/L (ref 101–111)
Creatinine, Ser: 0.87 mg/dL (ref 0.44–1.00)
GFR calc Af Amer: >60 mL/min (ref >60)
GLUCOSE: 137 mg/dL — AB (ref 65–99)
POTASSIUM: 3.2 mmol/L — AB (ref 3.5–5.1)
Sodium: 140 mmol/L (ref 135–145)

## 2016-09-21 LAB — GLUCOSE, CAPILLARY
Glucose-Capillary: 134 mg/dL — ABNORMAL HIGH (ref 65–99)
Glucose-Capillary: 119 mg/dL — ABNORMAL HIGH (ref 65–99)
Glucose-Capillary: 112 mg/dL — ABNORMAL HIGH (ref 65–99)
Glucose-Capillary: 123 mg/dL — ABNORMAL HIGH (ref 65–99)

## 2016-09-21 LAB — CBC
HEMATOCRIT: 29.2 % — AB (ref 36.0–46.0)
Hemoglobin: 9.6 g/dL — ABNORMAL LOW (ref 12.0–15.0)
MCH: 30.4 pg (ref 26.0–34.0)
MCHC: 32.9 g/dL (ref 30.0–36.0)
MCV: 92.4 fL (ref 78.0–100.0)
PLATELETS: 189 10*3/uL (ref 150–400)
RBC: 3.16 MIL/uL — AB (ref 3.87–5.11)
RDW: 15.2 % (ref 11.5–15.5)
WBC: 12.1 10*3/uL — AB (ref 4.0–10.5)

## 2016-09-21 LAB — PROTIME-INR
INR: 1.12
Prothrombin Time: 14.5 seconds (ref 11.4–15.2)

## 2016-09-21 MED ORDER — SODIUM CHLORIDE 0.9 % IV SOLN
3.0000 g | Freq: Four times a day (QID) | INTRAVENOUS | Status: DC
  Administered 2016-09-21 – 2016-09-23 (×8): 3 g via INTRAVENOUS
  Filled 2016-09-21 (×10): qty 3

## 2016-09-21 MED ORDER — FENTANYL CITRATE (PF) 100 MCG/2ML IJ SOLN
INTRAMUSCULAR | Status: AC | PRN
  Administered 2016-09-21: 50 ug via INTRAVENOUS

## 2016-09-21 MED ORDER — MIDAZOLAM HCL 2 MG/2ML IJ SOLN
INTRAMUSCULAR | Status: AC
  Filled 2016-09-21: qty 2

## 2016-09-21 MED ORDER — FUROSEMIDE 10 MG/ML IJ SOLN
20.0000 mg | Freq: Once | INTRAMUSCULAR | Status: AC
  Administered 2016-09-22: 20 mg via INTRAVENOUS
  Filled 2016-09-21: qty 2

## 2016-09-21 MED ORDER — FENTANYL CITRATE (PF) 100 MCG/2ML IJ SOLN
INTRAMUSCULAR | Status: AC
  Administered 2016-09-21: 100 ug
  Filled 2016-09-21: qty 2

## 2016-09-21 MED ORDER — POTASSIUM CHLORIDE CRYS ER 20 MEQ PO TBCR
40.0000 meq | EXTENDED_RELEASE_TABLET | Freq: Once | ORAL | Status: AC
  Administered 2016-09-21: 40 meq via ORAL
  Filled 2016-09-21: qty 2

## 2016-09-21 MED ORDER — MIDAZOLAM HCL 2 MG/2ML IJ SOLN
INTRAMUSCULAR | Status: AC | PRN
  Administered 2016-09-21: 1 mg via INTRAVENOUS

## 2016-09-21 MED ORDER — LIDOCAINE HCL 1 % IJ SOLN
INTRAMUSCULAR | Status: AC
  Filled 2016-09-21: qty 20

## 2016-09-21 MED ORDER — HEPARIN SODIUM (PORCINE) 5000 UNIT/ML IJ SOLN
5000.0000 [IU] | Freq: Three times a day (TID) | INTRAMUSCULAR | Status: DC
  Administered 2016-09-21 – 2016-09-27 (×18): 5000 [IU] via SUBCUTANEOUS
  Filled 2016-09-21 (×18): qty 1

## 2016-09-21 MED ORDER — SODIUM CHLORIDE 0.9 % IV SOLN
30.0000 meq | Freq: Once | INTRAVENOUS | Status: DC
  Filled 2016-09-21: qty 15

## 2016-09-21 NOTE — Progress Notes (Signed)
Reviewed medicare AWV and agree.  Signed:  Crissie Sickles, MD           09/21/2016

## 2016-09-21 NOTE — Progress Notes (Signed)
Patient Name: [name] Date of Encounter: 09/21/2016  Primary Cardiologist: New, Dr. Ellyn Hack.  Hospital Problem List     Principal Problem:   Cardiac arrest with ventricular fibrillation (HCC) Active Problems:   DM (diabetes mellitus), type 2, uncontrolled with complications ([place])   Non-occlusive coronary artery disease   Diverticulitis of large intestine with perforation and abscess   Takotsubo cardiomyopathy   Acute respiratory failure (HCC)   Hypokalemia   Hypertension associated with chronic kidney disease due to type 2 diabetes mellitus (HCC)     Subjective   Complains of pain in chest wall from prior CPR.   Denies anginal pain. No SOB. Generally feels poorly.  Inpatient Medications    Scheduled Meds: . aspirin  81 mg Oral Daily  . famotidine  20 mg Oral Daily  . feeding supplement  1 Container Oral BID BM  . heparin  5,000 Units Subcutaneous Q8H  . insulin aspart  0-15 Units Subcutaneous TID WC  . insulin aspart  0-5 Units Subcutaneous QHS  . ketorolac  1 drop Left Eye QID  . lisinopril  2.5 mg Oral BID  . mouth rinse  15 mL Mouth Rinse BID  . metoprolol tartrate  25 mg Oral BID  . piperacillin-tazobactam (ZOSYN)  IV  3.375 g Intravenous Q8H  . sodium chloride flush  10-40 mL Intracatheter Q12H  . sodium chloride flush  3 mL Intravenous Q12H   Continuous Infusions:  PRN Meds: sodium chloride, acetaminophen **OR** acetaminophen, bisacodyl, fentaNYL (SUBLIMAZE) injection, HYDROcodone-acetaminophen, levalbuterol, magnesium citrate, ondansetron **OR** ondansetron (ZOFRAN) IV, sodium chloride flush   Vital Signs    Vitals:   09/20/16 1443 09/20/16 2136 09/21/16 0405 09/21/16 0558  BP: (!) 138/46 114/76 (!) 114/44   Pulse: 90 100 98   Resp: '17 18 20   '$ Temp:  98.2 F (36.8 C) 98.6 F (37 C)   TempSrc:  Oral Oral   SpO2: 95% 91% 94%   Weight:    165 lb 8 oz (75.1 kg)  Height:        Intake/Output Summary (Last 24 hours) at 09/21/16 1025 Last  data filed at 09/21/16 0832  Gross per 24 hour  Intake               60 ml  Output                0 ml  Net               60 ml   Filed Weights   09/19/16 0325 09/20/16 0523 09/21/16 0558  Weight: 171 lb 4.8 oz (77.7 kg) 164 lb 14.4 oz (74.8 kg) 165 lb 8 oz (75.1 kg)    Physical Exam    GEN: Well nourished, well developed, obese female in no acute distress.  HEENT: Grossly normal.  Neck: Supple, no JVD, carotid bruits, or masses. Cardiac: RRR, no murmurs, rubs, or gallops. No clubbing, cyanosis, edema.  Radials/DP/PT 2+ and equal bilaterally.  Respiratory:  Respirations regular and unlabored, diffuse rhonchi GI: Soft, nontender, nondistended, BS + x 4. MS: no deformity or atrophy. Skin: warm and dry, no rash. Neuro:  Strength and sensation are intact. Psych: AAOx3.  Normal affect.  Labs    CBC  Recent Labs  09/20/16 0815 09/21/16 0331  WBC 12.8* 12.1*  HGB 9.9* 9.6*  HCT 30.5* 29.2*  MCV 90.5 92.4  PLT 161 834   Basic Metabolic Panel  Recent Labs  09/20/16 0815 09/21/16 0331  NA 140  140  K 3.8 3.2*  CL 103 102  CO2 31 29  GLUCOSE 123* 137*  BUN 11 8  CREATININE 0.89 0.87  CALCIUM 8.4* 8.5*   Cardiac Enzymes  Recent Labs  09/18/16 1100  TROPONINI 0.03*     Telemetry    NSR/sinus tachy - Personally Reviewed  ECG     NSR, wide QRS, inverted T waves diffusely- Personally Reviewed  Radiology    Ct Abdomen Pelvis W Contrast  Result Date: 09/20/2016 CLINICAL DATA:  Diverticulitis. EXAM: CT ABDOMEN AND PELVIS WITH CONTRAST TECHNIQUE: Multidetector CT imaging of the abdomen and pelvis was performed using the standard protocol following bolus administration of intravenous contrast. CONTRAST:  172m ISOVUE-300 IOPAMIDOL (ISOVUE-300) INJECTION 61% COMPARISON:  CT scan of September 14, 2016. FINDINGS: Lower chest: Mild bilateral pleural effusions are noted with adjacent subsegmental atelectasis. Hepatobiliary: Status post cholecystectomy. Stable  intrahepatic biliary dilatation is noted which most likely is related to post cholecystectomy status. No focal abnormality is seen in the liver. Pancreas: Unremarkable. No pancreatic ductal dilatation or surrounding inflammatory changes. Spleen: Normal in size without focal abnormality. Adrenals/Urinary Tract: Adrenal glands are unremarkable. Kidneys are normal, without renal calculi, focal lesion, or hydronephrosis. Bladder is unremarkable. Stomach/Bowel: Drainage catheter noted in para diverticular abscess on prior exam has been removed. 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 Mild inflammatory changes and fluid remain within the pelvis. There is no evidence of bowel obstruction. The appendix appears normal. Vascular/Lymphatic: Aortic atherosclerosis. No enlarged abdominal or pelvic lymph nodes. Reproductive: Status post hysterectomy. No adnexal masses. Other: None. Musculoskeletal: Mild degenerative disc disease is noted at L5-S1. IMPRESSION: Drainage catheter noted in paradiverticular abscess on prior exam has been removed. 3.9 x 2.1 cm persistent fluid collection with air-fluid level remains, concerning for abscess and possible fistulous connection to the sigmoid colon. Mild inflammatory changes in fluid remain within the pelvis. Aortic atherosclerosis. Mild bilateral pleural effusions with adjacent subsegmental atelectasis. Electronically Signed   By: JMarijo Conception M.D.   On: 09/20/2016 15:45    Cardiac Studies   Left Heart Cath and Coronary Angiography  There is severe left ventricular systolic dysfunction.  LV end diastolic pressure is moderately elevated.  There is trivial (1+) mitral regurgitation.  Ost Cx to Prox Cx lesion, 10 %stenosed.  1st Diag lesion, 25 %stenosed.   Mild nonobstructive CAD with 85% smooth narrowing in the first  diagonal branch of the LAD; 10% smooth narrowing of the ostial proximal left circumflex coronary artery; and a normal dominant RCA.  Severe LV dysfunction with more pronounced mid, distal anterolateral, apical and distal inferior hypocontractility suggestive of a Takotsubo cardiomyopathy pattern with an ejection fraction of 20-25%.  RECOMMENDATION: Medical therapy.  With the patient's episode of torsade du p[ointes avoid medications with potential for QTC prolongation.   Patient Profile     70year old female with a past medical history significant for nonobstructive CAD (cath 2006 and again in 2013 with normal LV function, mild, non-obstructive CAD noted), HTN, DM 2, HLD, COPD, former smoker,Hx of non-small cell lung cater s/p chemo and radiation, hx of diverticulitis who initially presented on 11/19 with LLQ pain and found to have diverticulitis with abscess formation. Drain placed. 11/22 am when into cardiac arrest - [place] - VF-VT. Intubated and transferred to the ICU.   Review of tele shows that she initially  went into polymorphic VT without long short coupling Had ROSC in 6-8 minutes. Curiously Mag was 2.1 following the code.  Initial trop 0.12. EKG with no acute ST changes. Nonspecific IVCD new since last tracing. ECHO with EF 30-35% and mid-distal anteroseptal akinesis. No RV strain noted. Previously no know history of LV dysfunction.   CT scan of the abdomen done yesterday showed improving clinical findings. 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  Assessment & Plan    1. Cardiac arrest with polymorphic VT: Seen by EP, there is borderline QT prolongation but no long short coupling. No further VT. ? Related to stress induced cardiomyopathy.   She underwent left heart cath that showed now obstructive CAD, takotsubo cardiomyopathy. EF is 25%. Plan beta blocker therapy.    2. Diverticular abscess: Per  primary team, drain removed. CT shows reaccumulation with abscess. Plan to place drain in again today.  3. Severe LV dysfunction: EF 25%. Most likely stress induced cardiomyopathy. Takotsubo. Started on ACE-I, also on metoprolol. Will slowly titrate as BP allows.  Repeat Echo 6-8 weeks.  4. HTN: Well controlled on current regimen.     Signed,  Trishia Cuthrell [place], [place] 09/21/2016 10:25 AM

## 2016-09-21 NOTE — Progress Notes (Signed)
PROGRESS NOTE    [name]  KXF:[id number] DOB: [birth year] DOA: 09/11/2016   PCP: [name], MD   Brief Narrative:   70 y.o. female with hx diverticulitis, CAD, COPD, DM, HTN, hx NSCLC in 2012, initially presented 11/19 with abd pain.  Initial w/u revealed diverticulitis with abscess.  She was seen by surgery, started on zosyn and had IR drain placed 11/20.  She was improving slowly until 11/22 when she was found to be in SVT 190's, then subsequently had PEA arrest with total 6 mins CPR, Defib x 2 for VFib. Intubated during code, awake post arrest but remained hypotensive.   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  Assessment & Plan:     Diverticular abscess -  Had intra-abdominal drain placed by IR on 09/12/2016 and removed on 09/19/2016, she is on IV antibiotics starting date 09/11/2016, will transition to Unasyn on 09/21/2016, repeat imaging shows reaccumulation of abscess and she is due for another drain placement on 09/21/2016. Surgery and IR following defer management to them.  Cardiac arrest with polymorphic VT EF 25%. No acute CHF. - total 6 mins CPR, Defib x 2 for VFib, intubated during code - Cards consulting and following the patient, she underwent left heart catheterization on 09/14/2016 with clean coronaries, presentation suspicious for Takotsubo cardiac myopathy. Cards and EP recommend continuing beta blocker and Ace. We will continue to monitor electrolytes, repeat echo in 6-8 weeks     Acute respiratory failure ([place]) - s/p polymorphic V Tach arrest, extubated 11/23 - improved - continue to wean O2 as tolerated    Hypokalemia - supplemented will monitor    Acute kidney injury  - resolved     Hypertension, essential  - On lisinopril and metoprolol and BP reasonably controlled     COPD - stable, nebs PRN    DM (diabetes mellitus),  type 2, uncontrolled with long term use insulin (HCC) - on SSI now - on insulin 70/30 and metformin at home but held here until oral intake improves  CBG (last 3)   Recent Labs  09/20/16 2134 09/21/16 0632 09/21/16 1119  GLUCAP 162* 134* 119*    Lab Results  Component Value Date   HGBA1C 7.1 (H) 09/11/2016     DVT prophylaxis: Hep SQ Code Status: Full  Family Communication: none at bedside Disposition Plan: ready when consultants clear, also PT eval pending   CONSULTANTS:  CCS  IR  Cards  EP  PROCEDURES:  OETT 7.5 11/22 >> 11/23  R FEM ART LINE 11/22 >> 11/24  R IJ CVL 11/22 >>  OGT 11/22 >> 11/23  FOLEY 11/22 >>    STUDIES:   CT abd/pelvis 11/19: Focal diverticulitis of the mid sigmoid colon with adjacent 2.5 x 4.1 cm abscess and foci of pneumoperitoneum in this area. Small amount of complex free fluid within the pelvis.  CT chest/abd/pelvis 11/22: Radiation fibrosis changes in medial RIGHT upper lobe extending to the superior RIGHT hilum. Additional RIGHT upper lobe and scattered RIGHT lung infiltrates question pneumonia versus less likely asymmetric edema. Small BILATERAL pleural effusions RIGHT greater than LEFT with minimal adjacent compressive atelectasis of the lower lobes. Improved diverticulitis changes with resolution of the previously identified abscess collection adjacent to the sigmoid colon. Fractures of the sternum and the anterior RIGHT third and fifth ribs suspect related to CPR. Small pericardial fusion.  TTE 11/22:  LV EF 30-35%  w/ akinesis of mid-apicalanteroseptalmycoardium. Grade 1 diastolic dysfunciton. LA & RA normal in size. RV normal in size & function. Trivial aortic regurg w/o stenosis. Trivial mitral regurg. No pulmonic regurg. Mild tricuspid regurg. Trivial pericardial effusion.   Cath11/22/2017: Severe LV dysfunction (EF roughly 25%) with moderately-severely elevated LVEDP (25 mmHg). Mild nonobstructive CAD with 85% smooth  narrowing of L2 small-caliber D1. -- Suggestive of Takotsubo Cardiomyopathy.  MICROBIOLOGY:  Blood Ctx x2 11/19 >>  Urine Ctx 11/19:  Multiple species present  Diverticular Abscess Ctx 11/20:  Klebsiella pneumoniae (resistant Ampicillin)  MRSA PCR 11/22:  Negative  Blood Ctx x2 11/22 >>  ANTIBIOTICS:  Anti-infectives    Start     Dose/Rate Route Frequency Ordered Stop   09/15/16 1000  anidulafungin (ERAXIS) 100 mg in sodium chloride 0.9 % 100 mL IVPB  Status:  Discontinued     100 mg over 90 Minutes Intravenous Every 24 hours 09/14/16 0906 09/15/16 1411   09/14/16 1000  anidulafungin (ERAXIS) 200 mg in sodium chloride 0.9 % 200 mL IVPB     200 mg over 180 Minutes Intravenous  Once 09/14/16 0841 09/14/16 2255   09/14/16 0800  vancomycin (VANCOCIN) IVPB 1000 mg/200 mL premix  Status:  Discontinued     1,000 mg 200 mL/hr over 60 Minutes Intravenous Every 12 hours 09/14/16 0737 09/16/16 0903   09/11/16 2200  piperacillin-tazobactam (ZOSYN) IVPB 3.375 g     3.375 g 12.5 mL/hr over 240 Minutes Intravenous Every 8 hours 09/11/16 1840     09/11/16 1400  piperacillin-tazobactam (ZOSYN) IVPB 3.375 g     3.375 g 100 mL/hr over 30 Minutes Intravenous  Once 09/11/16 1346 09/11/16 1502      Subjective: Still having lower abd pain, passing gas, minimal output from drain  Objective: Vitals:   09/20/16 2136 09/21/16 0405 09/21/16 0558 09/21/16 1331  BP: 114/76 (!) 114/44  (!) 142/68  Pulse: 100 98  89  Resp: 18 20  (!) 21  Temp: 98.2 F (36.8 C) 98.6 F (37 C)  97.7 F (36.5 C)  TempSrc: Oral Oral  Oral  SpO2: 91% 94%  99%  Weight:   75.1 kg (165 lb 8 oz)   Height:        Intake/Output Summary (Last 24 hours) at 09/21/16 1339 Last data filed at 09/21/16 1253  Gross per 24 hour  Intake               60 ml  Output                0 ml  Net               60 ml   Filed Weights   09/19/16 0325 09/20/16 0523 09/21/16 0558  Weight: 77.7 kg (171 lb 4.8 oz) 74.8 kg (164 lb 14.4  oz) 75.1 kg (165 lb 8 oz)    Examination:  General exam: Appears calm and comfortable, AAOx3, no distress today  Respiratory system: faint basilar crackles. Cardiovascular system: S1 & S2 heard, RRR.  Gastrointestinal system: Abdomen is nondistended, soft, Non tender  Central nervous system: Alert and oriented. No focal neurological deficits. Extremities: Symmetric 5 x 5 power. Skin: No rashes, lesions or ulcers  Data Reviewed: I have personally reviewed following labs and imaging studies  CBC:  Recent Labs Lab 09/16/16 0435 09/17/16 0500 09/18/16 0509 09/20/16 0815 09/21/16 0331  WBC 9.4 9.4 8.9 12.8* 12.1*  NEUTROABS 7.0  --   --   --   --  HGB 9.9* 11.2* 10.2* 9.9* 9.6*  HCT 29.9* 33.8* 31.1* 30.5* 29.2*  MCV 90.1 89.4 90.1 90.5 92.4  PLT 153 175 174 161 798   Basic Metabolic Panel:  Recent Labs Lab 09/15/16 0450 09/16/16 0434 09/16/16 0435 09/17/16 0500 09/18/16 0508 09/20/16 0815 09/21/16 0331  NA 137 142  --  140 140 140 140  K 3.8 3.5  --  3.0* 3.7 3.8 3.2*  CL 109 112*  --  102 100* 103 102  CO2 22 22  --  29 33* 31 29  GLUCOSE 149* 107*  --  122* 115* 123* 137*  BUN 11 11  --  '10 11 11 8  '$ CREATININE 1.09* 1.07*  --  1.02* 0.95 0.89 0.87  CALCIUM 8.2* 8.2*  --  8.4* 8.3* 8.4* 8.5*  MG 1.8  --  1.7 2.1  --   --   --   PHOS  --  3.1  --   --   --   --   --    Liver Function Tests:  Recent Labs Lab 09/16/16 0434  ALBUMIN 2.3*   Coagulation Profile:  Recent Labs Lab 09/15/16 0450 09/21/16 0815  INR 1.29 1.12   Cardiac Enzymes:  Recent Labs Lab 09/14/16 2020 09/18/16 1100  TROPONINI 0.52* 0.03*   CBG:  Recent Labs Lab 09/20/16 1112 09/20/16 1651 09/20/16 2134 09/21/16 0632 09/21/16 1119  GLUCAP 216* 117* 162* 134* 119*   Urine analysis:    Component Value Date/Time   COLORURINE YELLOW 09/14/2016 1031   APPEARANCEUR HAZY (A) 09/14/2016 1031   LABSPEC 1.011 09/14/2016 1031   PHURINE 7.0 09/14/2016 1031   GLUCOSEU 100 (A)  09/14/2016 1031   HGBUR LARGE (A) 09/14/2016 1031   BILIRUBINUR NEGATIVE 09/14/2016 1031   KETONESUR NEGATIVE 09/14/2016 1031   PROTEINUR 100 (A) 09/14/2016 1031   UROBILINOGEN 1.0 02/03/2013 0912   NITRITE NEGATIVE 09/14/2016 1031   LEUKOCYTESUR NEGATIVE 09/14/2016 1031   Recent Results (from the past 240 hour(s))  Culture, blood (Routine X 2) w Reflex to ID Panel     Status: None   Collection Time: 09/11/16 11:50 AM  Result Value Ref Range Status   Specimen Description BLOOD RIGHT ANTECUBITAL  Final   Special Requests BOTTLES DRAWN AEROBIC AND ANAEROBIC 5CC  Final   Culture NO GROWTH 5 DAYS  Final   Report Status 09/16/2016 FINAL  Final  Culture, blood (Routine X 2) w Reflex to ID Panel     Status: None   Collection Time: 09/11/16 11:55 AM  Result Value Ref Range Status   Specimen Description BLOOD RIGHT HAND  Final   Special Requests BOTTLES DRAWN AEROBIC AND ANAEROBIC 5CC  Final   Culture NO GROWTH 5 DAYS  Final   Report Status 09/16/2016 FINAL  Final  Urine culture     Status: Abnormal   Collection Time: 09/11/16  3:36 PM  Result Value Ref Range Status   Specimen Description URINE, RANDOM  Final   Special Requests NONE  Final   Culture MULTIPLE SPECIES PRESENT, SUGGEST RECOLLECTION (A)  Final   Report Status 09/12/2016 FINAL  Final  Aerobic/Anaerobic Culture (surgical/deep wound)     Status: None   Collection Time: 09/12/16 12:52 PM  Result Value Ref Range Status   Specimen Description ABSCESS  Final   Special Requests DIVERTICULAR ABSCESS  Final   Gram Stain   Final    MODERATE WBC PRESENT,BOTH PMN AND MONONUCLEAR RARE GRAM NEGATIVE RODS    Culture  Final    RARE KLEBSIELLA PNEUMONIAE MIXED ANAEROBIC FLORA PRESENT.  CALL LAB IF FURTHER IID REQUIRED.    Report Status 09/17/2016 FINAL  Final   Organism ID, Bacteria KLEBSIELLA PNEUMONIAE  Final      Susceptibility   Klebsiella pneumoniae - MIC*    AMPICILLIN 16 RESISTANT Resistant     CEFAZOLIN <=4 SENSITIVE  Sensitive     CEFEPIME <=1 SENSITIVE Sensitive     CEFTAZIDIME <=1 SENSITIVE Sensitive     CEFTRIAXONE <=1 SENSITIVE Sensitive     CIPROFLOXACIN <=0.25 SENSITIVE Sensitive     GENTAMICIN <=1 SENSITIVE Sensitive     IMIPENEM <=0.25 SENSITIVE Sensitive     TRIMETH/SULFA <=20 SENSITIVE Sensitive     AMPICILLIN/SULBACTAM <=2 SENSITIVE Sensitive     PIP/TAZO <=4 SENSITIVE Sensitive     Extended ESBL NEGATIVE Sensitive     * RARE KLEBSIELLA PNEUMONIAE  Surgical pcr screen     Status: None   Collection Time: 09/14/16  8:48 AM  Result Value Ref Range Status   MRSA, PCR NEGATIVE NEGATIVE Final   Staphylococcus aureus NEGATIVE NEGATIVE Final  MRSA PCR Screening     Status: None   Collection Time: 09/14/16  9:03 AM  Result Value Ref Range Status   MRSA by PCR NEGATIVE NEGATIVE Final  Culture, blood (Routine X 2) w Reflex to ID Panel     Status: None (Preliminary result)   Collection Time: 09/14/16 10:38 AM  Result Value Ref Range Status   Specimen Description BLOOD RIGHT ARM  Final   Special Requests BOTTLES DRAWN AEROBIC AND ANAEROBIC 5CC EACH  Final   Culture NO GROWTH 4 DAYS  Final   Report Status PENDING  Incomplete  Culture, blood (Routine X 2) w Reflex to ID Panel     Status: None (Preliminary result)   Collection Time: 09/14/16 10:48 AM  Result Value Ref Range Status   Specimen Description BLOOD RIGHT ARM  Final   Special Requests IN PEDIATRIC BOTTLE 3CC  Final   Culture NO GROWTH 4 DAYS  Final   Report Status PENDING  Incomplete     Radiology Studies:  Ir Sinus/fist Tube Chk-non Gi Result Date: 09/19/2016 INDICATION:  Status post drain injection demonstrating no fistula or residual large pelvic collection.    Scheduled Meds: . aspirin  81 mg Oral Daily  . famotidine  20 mg Oral Daily  . feeding supplement  1 Container Oral BID BM  . heparin  5,000 Units Subcutaneous Q8H  . insulin aspart  0-15 Units Subcutaneous TID WC  . insulin aspart  0-5 Units Subcutaneous QHS  .  ketorolac  1 drop Left Eye QID  . lisinopril  2.5 mg Oral BID  . mouth rinse  15 mL Mouth Rinse BID  . metoprolol tartrate  25 mg Oral BID  . piperacillin-tazobactam (ZOSYN)  IV  3.375 g Intravenous Q8H  . sodium chloride flush  10-40 mL Intracatheter Q12H  . sodium chloride flush  3 mL Intravenous Q12H   Continuous Infusions:   LOS: 9 days   Signature  Lala Lund K M.D on 09/21/2016 at 1:39 PM  Between 7am to 7pm - Pager - [phone], After 7pm go to [email or web address] - password [facility]  [facility]  - Office  [phone]

## 2016-09-21 NOTE — Sedation Documentation (Signed)
Patient is resting comfortably. 

## 2016-09-21 NOTE — Sedation Documentation (Signed)
Patient denies pain and is resting comfortably.  

## 2016-09-21 NOTE — Progress Notes (Addendum)
Pharmacy Antibiotic Note  [name] is a 70 y.o. female admitted on 09/11/2016 with Kleb pneumo diverticular abscess. Pharmacy has been consulted for de-escalation from Zosyn to Unasyn dosing - ABX D#11. Repeat imaging shows reaccumulation of abscess and due for another drain placement on 09/21/2016. Last dose of Zosyn at 0547 this AM. Afebrile, WBC down to 12.1, renal function stable, CrCl~59.  Plan: D/c Zosyn Start Unasyn 3g IV q6h Monitor clinical progress, c/s, renal function, LOT  Height: '5\' 2"'$  (157.5 cm) Weight: 165 lb 8 oz (75.1 kg) IBW/kg (Calculated) : 50.1  Temp (24hrs), Avg:98.2 F (36.8 C), Min:97.7 F (36.5 C), Max:98.6 F (37 C)   Recent Labs Lab 09/15/16 1425 09/16/16 0434 09/16/16 0435 09/16/16 0656 09/17/16 0500 09/18/16 0508 09/18/16 0509 09/20/16 0815 09/21/16 0331  WBC  --   --  9.4  --  9.4  --  8.9 12.8* 12.1*  CREATININE  --  1.07*  --   --  1.02* 0.95  --  0.89 0.87  LATICACIDVEN 0.8  --   --   --   --   --   --   --   --   VANCOTROUGH  --   --   --  22*  --   --   --   --   --     Estimated Creatinine Clearance: 58.7 mL/min (by C-G formula based on SCr of 0.87 mg/dL).    Allergies  Allergen Reactions  . Lactose Intolerance (Gi) Diarrhea  . Ibuprofen     REACTION: Anxiousness, hyperventilates    Elicia Lamp, PharmD, BCPS Clinical Pharmacist 09/21/2016 1:51 PM

## 2016-09-21 NOTE — Progress Notes (Signed)
7 Days Post-Op  Subjective: llq pain, otherwise ok, having loose stools, no n/v  Objective: Vital signs in last 24 hours: Temp:  [98.2 F (36.8 C)-98.6 F (37 C)] 98.6 F (37 C) (11/29 0405) Pulse Rate:  [90-100] 98 (11/29 0405) Resp:  [17-20] 20 (11/29 0405) BP: (114-138)/(44-76) 114/44 (11/29 0405) SpO2:  [91 %-95 %] 94 % (11/29 0405) Weight:  [75.1 kg (165 lb 8 oz)] 75.1 kg (165 lb 8 oz) (11/29 0558) Last BM Date: 09/21/16  Intake/Output from previous day: 11/28 0701 - 11/29 0700 In: 60 [I.V.:60] Out: -  Intake/Output this shift: No intake/output data recorded.  GI: tender llq otherwise soft nondistended  Lab Results:   Recent Labs  09/20/16 0815 09/21/16 0331  WBC 12.8* 12.1*  HGB 9.9* 9.6*  HCT 30.5* 29.2*  PLT 161 189   BMET  Recent Labs  09/20/16 0815 09/21/16 0331  NA 140 140  K 3.8 3.2*  CL 103 102  CO2 31 29  GLUCOSE 123* 137*  BUN 11 8  CREATININE 0.89 0.87  CALCIUM 8.4* 8.5*   PT/INR  Recent Labs  09/21/16 0815  LABPROT 14.5  INR 1.12   ABG No results for input(s): PHART, HCO3 in the last 72 hours.  Invalid input(s): PCO2, PO2  Studies/Results: Ct Abdomen Pelvis W Contrast  Result Date: 09/20/2016 CLINICAL DATA:  Diverticulitis. EXAM: CT ABDOMEN AND PELVIS WITH CONTRAST TECHNIQUE: Multidetector CT imaging of the abdomen and pelvis was performed using the standard protocol following bolus administration of intravenous contrast. CONTRAST:  138m ISOVUE-300 IOPAMIDOL (ISOVUE-300) INJECTION 61% COMPARISON:  CT scan of September 14, 2016. FINDINGS: Lower chest: Mild bilateral pleural effusions are noted with adjacent subsegmental atelectasis. Hepatobiliary: Status post cholecystectomy. Stable intrahepatic biliary dilatation is noted which most likely is related to post cholecystectomy status. No focal abnormality is seen in the liver. Pancreas: Unremarkable. No pancreatic ductal dilatation or surrounding inflammatory changes. Spleen: Normal  in size without focal abnormality. Adrenals/Urinary Tract: Adrenal glands are unremarkable. Kidneys are normal, without renal calculi, focal lesion, or hydronephrosis. Bladder is unremarkable. Stomach/Bowel: Drainage catheter noted in para diverticular abscess on prior exam has been removed. 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 Mild inflammatory changes and fluid remain within the pelvis. There is no evidence of bowel obstruction. The appendix appears normal. Vascular/Lymphatic: Aortic atherosclerosis. No enlarged abdominal or pelvic lymph nodes. Reproductive: Status post hysterectomy. No adnexal masses. Other: None. Musculoskeletal: Mild degenerative disc disease is noted at L5-S1. IMPRESSION: Drainage catheter noted in paradiverticular abscess on prior exam has been removed. 3.9 x 2.1 cm persistent fluid collection with air-fluid level remains, concerning for abscess and possible fistulous connection to the sigmoid colon. Mild inflammatory changes in fluid remain within the pelvis. Aortic atherosclerosis. Mild bilateral pleural effusions with adjacent subsegmental atelectasis. Electronically Signed   By: JMarijo Conception M.D.   On: 09/20/2016 15:45    Anti-infectives: Anti-infectives    Start     Dose/Rate Route Frequency Ordered Stop   09/15/16 1000  anidulafungin (ERAXIS) 100 mg in sodium chloride 0.9 % 100 mL IVPB  Status:  Discontinued     100 mg over 90 Minutes Intravenous Every 24 hours 09/14/16 0906 09/15/16 1411   09/14/16 1000  anidulafungin (ERAXIS) 200 mg in sodium chloride 0.9 % 200 mL IVPB     200 mg over 180 Minutes Intravenous  Once  09/14/16 0841 09/14/16 2255   09/14/16 0800  vancomycin (VANCOCIN) IVPB 1000 mg/200 mL premix  Status:  Discontinued     1,000 mg 200 mL/hr over 60 Minutes Intravenous Every 12 hours 09/14/16 0737  09/16/16 0903   09/11/16 2200  piperacillin-tazobactam (ZOSYN) IVPB 3.375 g     3.375 g 12.5 mL/hr over 240 Minutes Intravenous Every 8 hours 09/11/16 1840     09/11/16 1400  piperacillin-tazobactam (ZOSYN) IVPB 3.375 g     3.375 g 100 mL/hr over 30 Minutes Intravenous  Once 09/11/16 1346 09/11/16 1502      Assessment/Plan: Diverticulitis with abscess Perc drain removed after drain injection, wbc increased, another abscess on ct - for IR drainage today, I dont think best plan is surgery for her given course and we have not exhausted conservative options - culture Klebsiella pneumoniae sensitive to Zosyn, would continue zosyn due to abscess - will need colonoscopy 6 weeks after discharge COPD with recent exacerbation in the setting of recent PNA , history of limited stage SCLCa , currently stable  Cardiac arrest/CHF: per cardiology DM HLD GERD ID - [id number]>> VTE - lovenox FEN - full liquids after drain today  I tried to call husband a couple times again today and have gotten no answer  [facility] 09/21/2016

## 2016-09-21 NOTE — Progress Notes (Signed)
Referring Physician(s): Rolm Bookbinder  Supervising Physician: Aletta Edouard  Patient Status:  [facility] - In-pt  Chief Complaint:  Recurrrent Diverticular Abscess  Subjective:  [name] is doing fair today. She has no new complaints other than she just doesn't feel well.   Allergies: Lactose intolerance (gi) and Ibuprofen  Medications: Prior to Admission medications   Medication Sig Start Date End Date Taking? Authorizing Provider  acetaminophen (TYLENOL) 500 MG tablet Take 1,000 mg by mouth every 6 (six) hours as needed for headache (pain).   Yes Historical Provider, MD  albuterol (PROAIR HFA) 108 (90 Base) MCG/ACT inhaler Inhale 2 puffs into the lungs every 6 (six) hours as needed for wheezing or shortness of breath.   Yes Historical Provider, MD  albuterol (PROVENTIL) (2.5 MG/3ML) 0.083% nebulizer solution USE ONE VIAL IN NEBULIZER EVERY 4 HOURS AS NEEDED FOR WHEEZING OR SHORTNESS OF BREATH 12/16/15  Yes [name], MD  aspirin EC 81 MG tablet Take 81 mg by mouth at bedtime.   Yes Historical Provider, MD  aspirin-acetaminophen-caffeine (EXCEDRIN MIGRAINE) [phone] MG per tablet Take 1 tablet by mouth every 6 (six) hours as needed for headache.   Yes Historical Provider, MD  atorvastatin (LIPITOR) 80 MG tablet TAKE ONE TABLET BY MOUTH ONCE DAILY Patient taking differently: TAKE ONE TABLET BY MOUTH ONCE DAILY AT BEDTIME 07/18/16  Yes [name], MD  Bromfenac Sodium (PROLENSA) 0.07 % SOLN Place 1 drop into the left eye at bedtime.   Yes Historical Provider, MD  doxycycline (VIBRA-TABS) 100 MG tablet Take 1 tablet (100 mg total) by mouth 2 (two) times daily. Patient taking differently: Take 100 mg by mouth 2 (two) times daily. 10 day course started 09/07/16 09/07/16  Yes [name], MD  fluticasone (FLONASE) 50 MCG/ACT nasal spray Place 2 sprays into both nostrils at bedtime. 06/25/14  Yes [name], MD  Insulin Isophane & Regular Human (HUMULIN 70/30  KWIKPEN) (70-30) 100 UNIT/ML PEN Inject 16 units subcutaneously before breakfast and 20 units before supper Patient taking differently: Inject 20-25 Units into the skin See admin instructions. Inject 20 units subcutaneously before breakfast and 25 units before supper 06/20/16  Yes Eugenie Filler, MD  loratadine (CLARITIN) 10 MG tablet Take 10 mg by mouth at bedtime.    Yes Historical Provider, MD  metFORMIN (GLUCOPHAGE) 500 MG tablet TAKE ONE TABLET BY MOUTH TWICE DAILY WITH MEALS 01/21/16  Yes [name], MD  nitroGLYCERIN (NITROSTAT) 0.4 MG SL tablet Place 1 tablet (0.4 mg total) under the tongue every 5 (five) minutes as needed. For chest pain Patient taking differently: Place 0.4 mg under the tongue every 5 (five) minutes as needed for chest pain.  12/31/12  Yes Lisabeth Pick, MD  pantoprazole (PROTONIX) 40 MG tablet TAKE ONE TABLET BY MOUTH ONCE DAILY Patient taking differently: TAKE ONE TABLET BY MOUTH ONCE DAILY AT BEDTIME 10/15/15  Yes [name], MD  prednisoLONE acetate (PRED FORTE) 1 % ophthalmic suspension Place 1 drop into the left eye at bedtime.  01/18/16  Yes Historical Provider, MD  predniSONE (DELTASONE) 20 MG tablet 2 tabs po qd x 5d Patient taking differently: Take 40 mg by mouth daily with breakfast. 5 day course started 09/07/16 09/07/16  Yes [name], MD  FLOVENT HFA 220 MCG/ACT inhaler INHALE TWO PUFFS BY MOUTH TWICE DAILY Patient not taking: Reported on 09/11/2016 01/12/16   [name], MD     Vital Signs: BP (!) 114/44 (  BP Location: Right Arm)   Pulse 98   Temp 98.6 F (37 C) (Oral)   Resp 20   Ht '5\' 2"'$  (1.575 m)   Wt 165 lb 8 oz (75.1 kg)   SpO2 94%   BMI 30.27 kg/m   Physical Exam  Awake and alert NAD Abdomen diffusely tender Heart RRR Lungs with some rhonchi  Imaging: Ct Abdomen Pelvis W Contrast  Result Date: 09/20/2016 CLINICAL DATA:  Diverticulitis. EXAM: CT ABDOMEN AND PELVIS WITH CONTRAST TECHNIQUE: Multidetector CT  imaging of the abdomen and pelvis was performed using the standard protocol following bolus administration of intravenous contrast. CONTRAST:  19m ISOVUE-300 IOPAMIDOL (ISOVUE-300) INJECTION 61% COMPARISON:  CT scan of September 14, 2016. FINDINGS: Lower chest: Mild bilateral pleural effusions are noted with adjacent subsegmental atelectasis. Hepatobiliary: Status post cholecystectomy. Stable intrahepatic biliary dilatation is noted which most likely is related to post cholecystectomy status. No focal abnormality is seen in the liver. Pancreas: Unremarkable. No pancreatic ductal dilatation or surrounding inflammatory changes. Spleen: Normal in size without focal abnormality. Adrenals/Urinary Tract: Adrenal glands are unremarkable. Kidneys are normal, without renal calculi, focal lesion, or hydronephrosis. Bladder is unremarkable. Stomach/Bowel: Drainage catheter noted in para diverticular abscess on prior exam has been removed. 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 Mild inflammatory changes and fluid remain within the pelvis. There is no evidence of bowel obstruction. The appendix appears normal. Vascular/Lymphatic: Aortic atherosclerosis. No enlarged abdominal or pelvic lymph nodes. Reproductive: Status post hysterectomy. No adnexal masses. Other: None. Musculoskeletal: Mild degenerative disc disease is noted at L5-S1. IMPRESSION: Drainage catheter noted in paradiverticular abscess on prior exam has been removed. 3.9 x 2.1 cm persistent fluid collection with air-fluid level remains, concerning for abscess and possible fistulous connection to the sigmoid colon. Mild inflammatory changes in fluid remain within the pelvis. Aortic atherosclerosis. Mild bilateral pleural effusions with adjacent subsegmental atelectasis. Electronically Signed   By: JMarijo Conception  M.D.   On: 09/20/2016 15:45   Ir Sinus/fist Tube Chk-non Gi  Result Date: 09/19/2016 INDICATION: 70year old female with a history of diverticular abscess, status post drainage. EXAM: SINUS TRACT INJECTION/FISTULOGRAM MEDICATIONS: The patient is currently admitted to the hospital and receiving intravenous antibiotics. The antibiotics were administered within an appropriate time frame prior to the initiation of the procedure. ANESTHESIA/SEDATION: None COMPLICATIONS: None PROCEDURE: Informed written consent was obtained from the patient after a thorough discussion of the procedural risks, benefits and alternatives. All questions were addressed. Maximal Sterile Barrier Technique was utilized. Time out was performed. The indwelling pelvis drain was injected under fluoroscopy. Spot images were acquired in multiple obliquities. Catheter was attached to drainage bag. FINDINGS: No fistula identified. Free contrast within the pelvis with no large residual collection. IMPRESSION: Status post drain injection demonstrating no fistula or residual large pelvic collection. Signed, JDulcy Fanny WEarleen Newport DO [facility] Electronically Signed   By: JCorrie MckusickD.O.   On: 09/19/2016 09:51    Labs:  CBC:  Recent Labs  09/17/16 0500 09/18/16 0509 09/20/16 0815 09/21/16 0331  WBC 9.4 8.9 12.8* 12.1*  HGB 11.2* 10.2* 9.9* 9.6*  HCT 33.8* 31.1* 30.5* 29.2*  PLT 175 174 161 189    COAGS:  Recent Labs  06/14/16 2032 09/12/16 0422 09/15/16 0450  INR 1.20 1.34 1.29  APTT 34  --  26    BMP:  Recent Labs  09/17/16 0500 09/18/16 0508 09/20/16 0815 09/21/16 0331  NA 140 140 140 140  K 3.0* 3.7 3.8 3.2*  CL 102 100* 103 102  CO2 29 33* 31 29  GLUCOSE 122* 115* 123* 137*  BUN '10 11 11 8  '$ CALCIUM 8.4* 8.3* 8.4* 8.5*  CREATININE 1.02* 0.95 0.89 0.87  GFRNONAA 55* >60 >60 >60  GFRAA >60 >60 >60 >60    LIVER FUNCTION TESTS:  Recent Labs   12/29/15 2327 06/14/16 1158 09/11/16 1030 09/12/16 0422 09/16/16 0434  BILITOT 0.3 0.9 0.5 0.7  --   AST '24 22 17 '$ 14*  --   ALT '31 19 16 '$ 13*  --   ALKPHOS 92 105 86 67  --   PROT 6.8 7.8 6.9 5.2*  --   ALBUMIN 3.6 4.0 3.5 2.6* 2.3*    Assessment and Plan:  CT scan with 3.9 cm fluid collection today after removal of drain on Monday.  Will proceed with CT guided aspiration/possible drain placement today by Dr. Kathlene Cote.  Risks and Benefits discussed with the patient including bleeding, infection, damage to adjacent structures, bowel perforation/fistula connection, and sepsis.  All of the patient's questions were answered, patient is agreeable to proceed. Consent signed and in chart.   Electronically Signed: Murrell Redden PA-C 09/21/2016, 8:55 AM   I spent a total of 15 Minutes at the the patient's bedside AND on the patient's hospital floor or unit, greater than 50% of which was counseling/coordinating care for CT guided aspiration/drain

## 2016-09-21 NOTE — Procedures (Signed)
Interventional Radiology Procedure Note  Procedure: Placement of a 30F drain.  Aspiration yields 10 mL purulent fluid, sample sent for cx.  Complications: None  Estimated Blood Loss: 0  Recommendations: - Drain to JP bulb  Signed,  Criselda Peaches, MD

## 2016-09-21 NOTE — Progress Notes (Signed)
PT Cancellation Note  Patient Details Name: [name] MRN: [id number] DOB: 10/21/1946   Cancelled Treatment:    Reason Eval/Treat Not Completed: Patient at procedure or test/unavailable;Other (comment) (Pt in radiology to receive abdominal drain, after speaking with nurse patient returning down the hall but RN reports to defer treatment.  )   [name] [name] 09/21/2016, 4:21 PM  Governor Rooks, PTA pager [phone]

## 2016-09-22 ENCOUNTER — Inpatient Hospital Stay (HOSPITAL_COMMUNITY): Payer: Medicare Other

## 2016-09-22 LAB — CBC
HCT: 29.2 % — ABNORMAL LOW (ref 36.0–46.0)
HEMOGLOBIN: 9.6 g/dL — AB (ref 12.0–15.0)
MCH: 30.4 pg (ref 26.0–34.0)
MCHC: 32.9 g/dL (ref 30.0–36.0)
MCV: 92.4 fL (ref 78.0–100.0)
Platelets: 211 10*3/uL (ref 150–400)
RBC: 3.16 MIL/uL — ABNORMAL LOW (ref 3.87–5.11)
RDW: 15.1 % (ref 11.5–15.5)
WBC: 10.4 10*3/uL (ref 4.0–10.5)

## 2016-09-22 LAB — GLUCOSE, CAPILLARY
Glucose-Capillary: 109 mg/dL — ABNORMAL HIGH (ref 65–99)
Glucose-Capillary: 162 mg/dL — ABNORMAL HIGH (ref 65–99)
Glucose-Capillary: 273 mg/dL — ABNORMAL HIGH (ref 65–99)
Glucose-Capillary: 199 mg/dL — ABNORMAL HIGH (ref 65–99)

## 2016-09-22 LAB — BASIC METABOLIC PANEL
Anion gap: 11 (ref 5–15)
BUN: 8 mg/dL (ref 6–20)
CO2: 25 mmol/L (ref 22–32)
CREATININE: 0.85 mg/dL (ref 0.44–1.00)
Calcium: 8.5 mg/dL — ABNORMAL LOW (ref 8.9–10.3)
Chloride: 103 mmol/L (ref 101–111)
GFR calc Af Amer: >60 mL/min (ref >60)
Glucose, Bld: 102 mg/dL — ABNORMAL HIGH (ref 65–99)
POTASSIUM: 3.8 mmol/L (ref 3.5–5.1)
SODIUM: 139 mmol/L (ref 135–145)

## 2016-09-22 LAB — C DIFFICILE QUICK SCREEN W PCR REFLEX
C Diff antigen: NEGATIVE
C Diff interpretation: NOT DETECTED
C Diff toxin: NEGATIVE

## 2016-09-22 LAB — MAGNESIUM: MAGNESIUM: 2.2 mg/dL (ref 1.7–2.4)

## 2016-09-22 MED ORDER — FUROSEMIDE 10 MG/ML IJ SOLN
60.0000 mg | Freq: Once | INTRAMUSCULAR | Status: AC
  Administered 2016-09-22: 60 mg via INTRAVENOUS
  Filled 2016-09-22: qty 6

## 2016-09-22 MED ORDER — ZOLPIDEM TARTRATE 5 MG PO TABS
5.0000 mg | ORAL_TABLET | Freq: Every evening | ORAL | Status: DC | PRN
  Administered 2016-09-22: 5 mg via ORAL
  Filled 2016-09-22: qty 1

## 2016-09-22 MED ORDER — METOPROLOL TARTRATE 25 MG PO TABS
37.5000 mg | ORAL_TABLET | Freq: Two times a day (BID) | ORAL | Status: DC
  Administered 2016-09-22 – 2016-09-27 (×9): 37.5 mg via ORAL
  Filled 2016-09-22 (×10): qty 1

## 2016-09-22 NOTE — Progress Notes (Signed)
PROGRESS NOTE    [name]  MCN:[id number] DOB: 08-25-1946 DOA: 09/11/2016   PCP: [name], MD   Brief Narrative:   70 y.o. female with hx diverticulitis, CAD, COPD, DM, HTN, hx NSCLC in 2012, initially presented 11/19 with abd pain.  Initial w/u revealed diverticulitis with abscess.  She was seen by surgery, started on zosyn and had IR drain placed 11/20.  She was improving slowly until 11/22 when she was found to be in SVT 190's, then subsequently had PEA arrest with total 6 mins CPR, Defib x 2 for VFib. Intubated during code, awake post arrest but remained hypotensive.   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  Assessment & Plan:   Diverticular abscess -  Had intra-abdominal drain placed by IR on 09/12/2016 and removed on 09/19/2016, she is on IV antibiotics starting date 09/11/2016, was transitioned to Unasyn on 09/21/2016, repeat imaging shows reaccumulation of abscess and she had another drain placement on 09/21/2016. Surgery and IR following defer management to them.  Cardiac arrest with polymorphic VT EF 25%. With mild acute on chronic systolic CHF. - total 6 mins CPR, Defib x 2 for VFib, intubated during code.  - Cards consulting and following the patient, she underwent left heart catheterization on 09/14/2016 with clean coronaries, presentation suspicious for Takotsubo cardiac myopathy. Cards and EP recommend continuing beta blocker and Ace. She developed some shortness of breath and rales on 09/22/2016 and a trial of IV Lasix will be given, cardiology following, We will continue to monitor electrolytes, repeat echo in 6-8 weeks   Acute respiratory failure ([place]) - s/p polymorphic V Tach arrest, extubated 11/23 - improved - continue to wean O2 as tolerated  Hypokalemia - supplemented will monitor  Acute kidney injury  - resolved    Hypertension, essential  - On lisinopril and metoprolol and BP reasonably controlled     COPD - stable, nebs PRN   Chronic right-sided lung opacity. On CT scan consistent with previous radiation-related changes, no productive cough or fevers, will monitor. She still has some coarse breath sounds for which flutter valve and encourage the patient to sit in the chair instead of bed. Nursing staff also informed on 09/22/2016 12:25 PM by me personally.  DM (diabetes mellitus), type 2, uncontrolled with long term use insulin ([place]) - on SSI now - on insulin 70/30 and metformin at home but held here until oral intake improves  CBG (last 3)   Recent Labs  09/21/16 2047 09/22/16 0621 09/22/16 1122  GLUCAP 123* 109* 162*    Lab Results  Component Value Date   HGBA1C 7.1 (H) 09/11/2016     DVT prophylaxis: Hep SQ Code Status: Full  Family Communication: none at bedside Disposition Plan: ready when consultants clear, also PT eval pending   CONSULTANTS:  CCS  IR  Cards  EP  PROCEDURES:  OETT 7.5 11/22 >> 11/23  R FEM ART LINE 11/22 >> 11/24  R IJ CVL 11/22 >>  OGT 11/22 >> 11/23  FOLEY 11/22 >>    STUDIES:   CT abd/pelvis 11/19: Focal diverticulitis of the mid sigmoid colon with adjacent 2.5 x 4.1 cm abscess and foci of pneumoperitoneum in this area. Small amount of complex free fluid within the pelvis.  CT chest/abd/pelvis 11/22: Radiation fibrosis changes in medial RIGHT upper lobe extending to the superior RIGHT hilum. Additional RIGHT upper lobe and scattered RIGHT lung  infiltrates question pneumonia versus less likely asymmetric edema. Small BILATERAL pleural effusions RIGHT greater than LEFT with minimal adjacent compressive atelectasis of the lower lobes. Improved diverticulitis changes with resolution of the previously identified abscess collection adjacent to the sigmoid colon. Fractures of the sternum and the anterior RIGHT third and fifth ribs suspect related  to CPR. Small pericardial fusion.  TTE 11/22:  LV EF 30-35% w/ akinesis of mid-apicalanteroseptalmycoardium. Grade 1 diastolic dysfunciton. LA & RA normal in size. RV normal in size & function. Trivial aortic regurg w/o stenosis. Trivial mitral regurg. No pulmonic regurg. Mild tricuspid regurg. Trivial pericardial effusion.   Cath11/22/2017: Severe LV dysfunction (EF roughly 25%) with moderately-severely elevated LVEDP (25 mmHg). Mild nonobstructive CAD with 85% smooth narrowing of L2 small-caliber D1. -- Suggestive of Takotsubo Cardiomyopathy.  MICROBIOLOGY:  Blood Ctx x2 11/19 >>  Urine Ctx 11/19:  Multiple species present  Diverticular Abscess Ctx 11/20:  Klebsiella pneumoniae (resistant Ampicillin)  MRSA PCR 11/22:  Negative  Blood Ctx x2 11/22 >>  ANTIBIOTICS:  Anti-infectives    Start     Dose/Rate Route Frequency Ordered Stop   09/21/16 1400  Ampicillin-Sulbactam (UNASYN) 3 g in sodium chloride 0.9 % 100 mL IVPB     3 g 200 mL/hr over 30 Minutes Intravenous Every 6 hours 09/21/16 1348     09/15/16 1000  anidulafungin (ERAXIS) 100 mg in sodium chloride 0.9 % 100 mL IVPB  Status:  Discontinued     100 mg over 90 Minutes Intravenous Every 24 hours 09/14/16 0906 09/15/16 1411   09/14/16 1000  anidulafungin (ERAXIS) 200 mg in sodium chloride 0.9 % 200 mL IVPB     200 mg over 180 Minutes Intravenous  Once 09/14/16 0841 09/14/16 2255   09/14/16 0800  vancomycin (VANCOCIN) IVPB 1000 mg/200 mL premix  Status:  Discontinued     1,000 mg 200 mL/hr over 60 Minutes Intravenous Every 12 hours 09/14/16 0737 09/16/16 0903   09/11/16 2200  piperacillin-tazobactam (ZOSYN) IVPB 3.375 g  Status:  Discontinued     3.375 g 12.5 mL/hr over 240 Minutes Intravenous Every 8 hours 09/11/16 1840 09/21/16 1348   09/11/16 1400  piperacillin-tazobactam (ZOSYN) IVPB 3.375 g     3.375 g 100 mL/hr over 30 Minutes Intravenous  Once 09/11/16 1346 09/11/16 1502      Subjective: Still having lower abd  pain, passing gas, minimal output from drain  Objective: Vitals:   09/21/16 1547 09/21/16 2050 09/22/16 0643 09/22/16 1149  BP: (!) 133/50 137/89 134/68 125/64  Pulse: 92 (!) 104 (!) 103 (!) 117  Resp: '12 18 18   '$ Temp:  98.4 F (36.9 C) 98.3 F (36.8 C)   TempSrc:  Oral Oral   SpO2: 100% 100% 100% 93%  Weight:   74.4 kg (164 lb)   Height:        Intake/Output Summary (Last 24 hours) at 09/22/16 1226 Last data filed at 09/22/16 1204  Gross per 24 hour  Intake                0 ml  Output             1802 ml  Net            -1802 ml   Filed Weights   09/20/16 0523 09/21/16 0558 09/22/16 0643  Weight: 74.8 kg (164 lb 14.4 oz) 75.1 kg (165 lb 8 oz) 74.4 kg (164 lb)    Examination:  General exam: Appears calm and comfortable, AAOx3,  no distress today  Respiratory system: faint basilar crackles. Cardiovascular system: S1 & S2 heard, RRR.  Gastrointestinal system: Abdomen is nondistended, soft, Non tender  Central nervous system: Alert and oriented. No focal neurological deficits. Extremities: Symmetric 5 x 5 power. Skin: No rashes, lesions or ulcers  Data Reviewed: I have personally reviewed following labs and imaging studies  CBC:  Recent Labs Lab 09/16/16 0435 09/17/16 0500 09/18/16 0509 09/20/16 0815 09/21/16 0331 09/22/16 0532  WBC 9.4 9.4 8.9 12.8* 12.1* 10.4  NEUTROABS 7.0  --   --   --   --   --   HGB 9.9* 11.2* 10.2* 9.9* 9.6* 9.6*  HCT 29.9* 33.8* 31.1* 30.5* 29.2* 29.2*  MCV 90.1 89.4 90.1 90.5 92.4 92.4  PLT 153 175 174 161 189 235   Basic Metabolic Panel:  Recent Labs Lab 09/16/16 0434 09/16/16 0435 09/17/16 0500 09/18/16 0508 09/20/16 0815 09/21/16 0331 09/22/16 0532  NA 142  --  140 140 140 140 139  K 3.5  --  3.0* 3.7 3.8 3.2* 3.8  CL 112*  --  102 100* 103 102 103  CO2 22  --  29 33* '31 29 25  '$ GLUCOSE 107*  --  122* 115* 123* 137* 102*  BUN 11  --  '10 11 11 8 8  '$ CREATININE 1.07*  --  1.02* 0.95 0.89 0.87 0.85  CALCIUM 8.2*  --  8.4*  8.3* 8.4* 8.5* 8.5*  MG  --  1.7 2.1  --   --   --  2.2  PHOS 3.1  --   --   --   --   --   --    Liver Function Tests:  Recent Labs Lab 09/16/16 0434  ALBUMIN 2.3*   Coagulation Profile:  Recent Labs Lab 09/21/16 0815  INR 1.12   Cardiac Enzymes:  Recent Labs Lab 09/18/16 1100  TROPONINI 0.03*   CBG:  Recent Labs Lab 09/21/16 1119 09/21/16 1627 09/21/16 2047 09/22/16 0621 09/22/16 1122  GLUCAP 119* 112* 123* 109* 162*   Urine analysis:    Component Value Date/Time   COLORURINE YELLOW 09/14/2016 1031   APPEARANCEUR HAZY (A) 09/14/2016 1031   LABSPEC 1.011 09/14/2016 1031   PHURINE 7.0 09/14/2016 1031   GLUCOSEU 100 (A) 09/14/2016 1031   HGBUR LARGE (A) 09/14/2016 1031   BILIRUBINUR NEGATIVE 09/14/2016 1031   KETONESUR NEGATIVE 09/14/2016 1031   PROTEINUR 100 (A) 09/14/2016 1031   UROBILINOGEN 1.0 02/03/2013 0912   NITRITE NEGATIVE 09/14/2016 1031   LEUKOCYTESUR NEGATIVE 09/14/2016 1031   Recent Results (from the past 240 hour(s))  Culture, blood (Routine X 2) w Reflex to ID Panel     Status: None   Collection Time: 09/11/16 11:50 AM  Result Value Ref Range Status   Specimen Description BLOOD RIGHT ANTECUBITAL  Final   Special Requests BOTTLES DRAWN AEROBIC AND ANAEROBIC 5CC  Final   Culture NO GROWTH 5 DAYS  Final   Report Status 09/16/2016 FINAL  Final  Culture, blood (Routine X 2) w Reflex to ID Panel     Status: None   Collection Time: 09/11/16 11:55 AM  Result Value Ref Range Status   Specimen Description BLOOD RIGHT HAND  Final   Special Requests BOTTLES DRAWN AEROBIC AND ANAEROBIC 5CC  Final   Culture NO GROWTH 5 DAYS  Final   Report Status 09/16/2016 FINAL  Final  Urine culture     Status: Abnormal   Collection Time: 09/11/16  3:36 PM  Result  Value Ref Range Status   Specimen Description URINE, RANDOM  Final   Special Requests NONE  Final   Culture MULTIPLE SPECIES PRESENT, SUGGEST RECOLLECTION (A)  Final   Report Status 09/12/2016 FINAL   Final  Aerobic/Anaerobic Culture (surgical/deep wound)     Status: None   Collection Time: 09/12/16 12:52 PM  Result Value Ref Range Status   Specimen Description ABSCESS  Final   Special Requests DIVERTICULAR ABSCESS  Final   Gram Stain   Final    MODERATE WBC PRESENT,BOTH PMN AND MONONUCLEAR RARE GRAM NEGATIVE RODS    Culture   Final    RARE KLEBSIELLA PNEUMONIAE MIXED ANAEROBIC FLORA PRESENT.  CALL LAB IF FURTHER IID REQUIRED.    Report Status 09/17/2016 FINAL  Final   Organism ID, Bacteria KLEBSIELLA PNEUMONIAE  Final      Susceptibility   Klebsiella pneumoniae - MIC*    AMPICILLIN 16 RESISTANT Resistant     CEFAZOLIN <=4 SENSITIVE Sensitive     CEFEPIME <=1 SENSITIVE Sensitive     CEFTAZIDIME <=1 SENSITIVE Sensitive     CEFTRIAXONE <=1 SENSITIVE Sensitive     CIPROFLOXACIN <=0.25 SENSITIVE Sensitive     GENTAMICIN <=1 SENSITIVE Sensitive     IMIPENEM <=0.25 SENSITIVE Sensitive     TRIMETH/SULFA <=20 SENSITIVE Sensitive     AMPICILLIN/SULBACTAM <=2 SENSITIVE Sensitive     PIP/TAZO <=4 SENSITIVE Sensitive     Extended ESBL NEGATIVE Sensitive     * RARE KLEBSIELLA PNEUMONIAE  Surgical pcr screen     Status: None   Collection Time: 09/14/16  8:48 AM  Result Value Ref Range Status   MRSA, PCR NEGATIVE NEGATIVE Final   Staphylococcus aureus NEGATIVE NEGATIVE Final  MRSA PCR Screening     Status: None   Collection Time: 09/14/16  9:03 AM  Result Value Ref Range Status   MRSA by PCR NEGATIVE NEGATIVE Final  Culture, blood (Routine X 2) w Reflex to ID Panel     Status: None (Preliminary result)   Collection Time: 09/14/16 10:38 AM  Result Value Ref Range Status   Specimen Description BLOOD RIGHT ARM  Final   Special Requests BOTTLES DRAWN AEROBIC AND ANAEROBIC 5CC EACH  Final   Culture NO GROWTH 4 DAYS  Final   Report Status PENDING  Incomplete  Culture, blood (Routine X 2) w Reflex to ID Panel     Status: None (Preliminary result)   Collection Time: 09/14/16 10:48 AM    Result Value Ref Range Status   Specimen Description BLOOD RIGHT ARM  Final   Special Requests IN PEDIATRIC BOTTLE 3CC  Final   Culture NO GROWTH 4 DAYS  Final   Report Status PENDING  Incomplete     Radiology Studies:  Ir Sinus/fist Tube Chk-non Gi Result Date: 09/19/2016 INDICATION:  Status post drain injection demonstrating no fistula or residual large pelvic collection.    Scheduled Meds: . ampicillin-sulbactam (UNASYN) IV  3 g Intravenous Q6H  . aspirin  81 mg Oral Daily  . famotidine  20 mg Oral Daily  . feeding supplement  1 Container Oral BID BM  . heparin  5,000 Units Subcutaneous Q8H  . insulin aspart  0-15 Units Subcutaneous TID WC  . insulin aspart  0-5 Units Subcutaneous QHS  . ketorolac  1 drop Left Eye QID  . lisinopril  2.5 mg Oral BID  . mouth rinse  15 mL Mouth Rinse BID  . metoprolol tartrate  37.5 mg Oral BID  . potassium chloride (  KCL MULTIRUN) 30 mEq in 265 mL IVPB  30 mEq Intravenous Once  . sodium chloride flush  10-40 mL Intracatheter Q12H  . sodium chloride flush  3 mL Intravenous Q12H   Continuous Infusions:   LOS: 10 days   Signature  Lala Lund K M.D on 09/22/2016 at 12:26 PM  Between 7am to 7pm - Pager - [phone], After 7pm go to [email or web address] - password [facility]  [facility]  - Office  [phone]

## 2016-09-22 NOTE — Progress Notes (Signed)
Pt has been evaluated by speech and ordered nectar thick fluids. Pt is refusing to drink any fluids with thickening powder in them. Pt was told by this RN that we cannot give the pt thin liquids to drink. Pt was informed that this is for her safety, to keep her from choking. Pt stated that she "did not care" and that she "does not want any drink with that thickener in it". Will continue plan of care.   Grant Fontana BSN, RN

## 2016-09-22 NOTE — Evaluation (Signed)
Clinical/Bedside Swallow Evaluation Patient Details  Name: [name] MRN: [id number] Date of Birth: 09/03/1946  Today's Date: 09/22/2016 Time: SLP Start Time (ACUTE ONLY): 1535 SLP Stop Time (ACUTE ONLY): 1548 SLP Time Calculation (min) (ACUTE ONLY): 13 min  Past Medical History:  Past Medical History:  Diagnosis Date  . Age-related nuclear cataract of both eyes 2016   +cortical age related cataracts OU   . Allergy    seasonal  . Arthritis    hands  . Bilateral diabetic retinopathy (HCC) 2015   Dr. Zigmund Daniel  . Blood transfusion without reported diagnosis    when taking chemo  . CAD (coronary artery disease)    NON obstructive. Cath 2006 preserved LV fxn, scattered irregularities without critical stenosis. 2008 stress echo negative for ischemia, but with hypertensive response  . COPD (chronic obstructive pulmonary disease) ([place])   . Diabetes mellitus with complication (HCC)    diab retinopathy OU (laser)  . Diverticulitis 05/2016  . GERD (gastroesophageal reflux disease)    protonix  . History of diverticulitis of colon   . Hyperlipidemia   . Hypertension   . Lung cancer ([place])    non-small cell lung ca, stage III in 05/2011; systemic chemotherapy concurrent with radiation followed by prophylactic cranial irradiation and has been observation since July of 2010 with no evidence for disease recurrence-released from onc f/u 12/2014 (needs annual cxr by PCP).  CXR 08/2015 stable.  . Mild cognitive impairment with memory loss    Likely from brain radiation therapy  . Open-angle glaucoma 2016   Dr. Shirley Muscat, (bilateral)---responding to topical therapy  . Osteopenia 03/2016   03/2016 DEXA T-score -2.2   Past Surgical History:  Past Surgical History:  Procedure Laterality Date  . ABDOMINAL HYSTERECTOMY  1997  . APPENDECTOMY    . CARDIAC CATHETERIZATION N/A 09/14/2016   Procedure: Left Heart Cath and Coronary Angiography;  Surgeon: Troy Sine, MD;  Location: [facility];  Service: Cardiovascular;  Laterality: N/A;  . CARPAL TUNNEL RELEASE    . CATARACT EXTRACTION    . CHOLECYSTECTOMY  2002  . COLONOSCOPY  10/24/00   normal.  [employer] hemoccult testing via [place] 06/18/15 was NEG  . IR GENERIC HISTORICAL  09/19/2016   IR SINUS/FIST TUBE CHK-NON GI 09/19/2016 Corrie Mckusick, DO [facility]-INTER[facility] RAD  . LEFT HEART CATHETERIZATION WITH CORONARY ANGIOGRAM N/A 05/30/2012   Procedure: LEFT HEART CATHETERIZATION WITH CORONARY ANGIOGRAM;  Surgeon: Burnell Blanks, MD;  Location: [facility] CATH LAB;  Service: Cardiovascular;  Laterality: N/A;   HPI:  70 y.o.female with hx diverticulitis, CAD, COPD, DM, HTN, hx NSCLC in 2012, initially presented 11/19 with abd pain. Initial w/u revealed diverticulitis with abscess. She was seen by surgery, started on zosyn and had IR drain placed 11/20. She was improving slowly until 11/22 when she was found to be in SVT 190's, then subsequently had PEA arrest with total 6 mins CPR, Defib x 2 for VFib. Intubated during code, awake post arrest but remained hypotensive. Extubated 11/23 and then found to have severe cardiomyopathy.   Assessment / Plan / Recommendation Clinical Impression  RN reported baseline coughing and chest congestion. Pt consumed thin liquids via straw and cup sips with intermittent coughing and throat clearing seen. Pt's work of breathing appeared to increase during thin liquid trials. When nectar thick liquids were given her respiratory status appeared to ease. Pt stated having a reduced appetite, but consumed pureed and regular solids with no difficulty. Recommend continuation of Dys 3  diet, but downgrade to nectar thick liquids given bedside performance. Will continue to follow for diet tolerance.     Aspiration Risk  Mild aspiration risk    Diet Recommendation Dysphagia 3 (Mech soft);Nectar-thick liquid   Liquid Administration via: Cup;No straw Medication Administration: Whole meds with puree Supervision: Patient able  to self feed;Intermittent supervision to cue for compensatory strategies Compensations: Small sips/bites;Slow rate;Minimize environmental distractions Postural Changes: Seated upright at 90 degrees;Remain upright for at least 30 minutes after po intake    Other  Recommendations Oral Care Recommendations: Oral care BID Other Recommendations: Order thickener from pharmacy;Prohibited food (jello, ice cream, thin soups);Remove water pitcher   Follow up Recommendations Other (comment) (tba)      Frequency and Duration min 2x/week  2 weeks       Prognosis Prognosis for Safe Diet Advancement: Good      Swallow Study   General HPI: 70 y.o.female with hx diverticulitis, CAD, COPD, DM, HTN, hx NSCLC in 2012, initially presented 11/19 with abd pain. Initial w/u revealed diverticulitis with abscess. She was seen by surgery, started on zosyn and had IR drain placed 11/20. She was improving slowly until 11/22 when she was found to be in SVT 190's, then subsequently had PEA arrest with total 6 mins CPR, Defib x 2 for VFib. Intubated during code, awake post arrest but remained hypotensive. Extubated 11/23 and then found to have severe cardiomyopathy. Type of Study: Bedside Swallow Evaluation Previous Swallow Assessment: none in chart Diet Prior to this Study: Thin liquids;Dysphagia 3 (soft) Temperature Spikes Noted: No Respiratory Status: Room air History of Recent Intubation: Yes Length of Intubations (days): 1 days Date extubated: 09/15/16 Behavior/Cognition: Alert;Cooperative Oral Care Completed by SLP: No Vision: Functional for self-feeding Self-Feeding Abilities: Able to feed self Patient Positioning: Upright in chair Baseline Vocal Quality: Normal Volitional Cough: Strong    Oral/Motor/Sensory Function Overall Oral Motor/Sensory Function: Within functional limits   Ice Chips Ice chips: Not tested   Thin Liquid Thin Liquid: Impaired Presentation: Straw;Cup;Self Fed Pharyngeal  Phase  Impairments: Throat Clearing - Delayed;Cough - Immediate;Other (comments) (change in respiratory status)    Nectar Thick Nectar Thick Liquid: Impaired Presentation: Cup;Self Fed Pharyngeal Phase Impairments: Cough - Delayed   Honey Thick Honey Thick Liquid: Not tested   Puree Puree: Within functional limits Presentation: Self Fed;Spoon   Solid   GO   Solid: Within functional limits Presentation: [place], Student SLP  Shela Leff 09/22/2016,4:06 PM

## 2016-09-22 NOTE — Progress Notes (Signed)
Right IJ catheter removed per order. Pt flat in bed during removal. Catheter intact. Pressure applied. Pt tolerated well. Vaseline gauze and tegaderm dressing applied.  Grant Fontana BSN, RN

## 2016-09-22 NOTE — Progress Notes (Signed)
CSW received consult regarding PT recommendation of SNF at discharge.  Patient is refusing SNF. She states that she is tired of being away from home. Patient states she wants to go home and she has sisters that works for nursing facility and they will help patient after discharge.   CSW signing off.  Rhea Pink, [name],  [place]

## 2016-09-22 NOTE — Progress Notes (Signed)
Patient Name: [name] Date of Encounter: 09/22/2016  Primary Cardiologist: New, Dr. Ellyn Hack.  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   Denies anginal pain. some SOB. Generally feels poorly.  Inpatient Medications    Scheduled Meds: . ampicillin-sulbactam (UNASYN) IV  3 g Intravenous Q6H  . aspirin  81 mg Oral Daily  . famotidine  20 mg Oral Daily  . feeding supplement  1 Container Oral BID BM  . heparin  5,000 Units Subcutaneous Q8H  . insulin aspart  0-15 Units Subcutaneous TID WC  . insulin aspart  0-5 Units Subcutaneous QHS  . ketorolac  1 drop Left Eye QID  . lisinopril  2.5 mg Oral BID  . mouth rinse  15 mL Mouth Rinse BID  . metoprolol tartrate  25 mg Oral BID  . potassium chloride (KCL MULTIRUN) 30 mEq in 265 mL IVPB  30 mEq Intravenous Once  . sodium chloride flush  10-40 mL Intracatheter Q12H  . sodium chloride flush  3 mL Intravenous Q12H   Continuous Infusions:  PRN Meds: sodium chloride, acetaminophen **OR** acetaminophen, bisacodyl, fentaNYL (SUBLIMAZE) injection, HYDROcodone-acetaminophen, levalbuterol, magnesium citrate, ondansetron **OR** ondansetron (ZOFRAN) IV, sodium chloride flush, zolpidem   Vital Signs    Vitals:   09/21/16 1543 09/21/16 1547 09/21/16 2050 09/22/16 0643  BP: (!) 147/60 (!) 133/50 137/89 134/68  Pulse: 91 92 (!) 104 (!) 103  Resp: '17 12 18 18  '$ Temp:   98.4 F (36.9 C) 98.3 F (36.8 C)  TempSrc:   Oral Oral  SpO2: 99% 100% 100% 100%    Weight:    164 lb (74.4 kg)  Height:        Intake/Output Summary (Last 24 hours) at 09/22/16 1034 Last data filed at 09/22/16 0800  Gross per 24 hour  Intake                0 ml  Output                2 ml  Net               -2 ml   Filed Weights   09/20/16 0523 09/21/16 0558 09/22/16 0643  Weight: 164 lb 14.4 oz (74.8 kg) 165 lb 8 oz (75.1 kg) 164 lb (74.4 kg)    Physical Exam    GEN: Well nourished, well developed, obese female in no acute distress.  HEENT: Grossly normal.  Neck: Supple, no JVD, carotid bruits, or masses. Cardiac: RRR, no murmurs, rubs, or gallops. No clubbing, cyanosis, edema.  Radials/DP/PT 2+ and equal bilaterally.  Respiratory:  Respirations regular and unlabored, diffuse rhonchi GI: Soft, nontender, nondistended, BS + x 4. MS: no deformity or atrophy. Skin: warm and dry, no rash. Neuro:  Strength and sensation are intact. Psych: AAOx3.  Normal affect.  Labs    CBC  Recent Labs  09/21/16 0331 09/22/16 0532  WBC 12.1* 10.4  HGB 9.6* 9.6*  HCT 29.2* 29.2*  MCV 92.4 92.4  PLT 189  696   Basic Metabolic Panel  Recent Labs  09/21/16 0331 09/22/16 0532  NA 140 139  K 3.2* 3.8  CL 102 103  CO2 29 25  GLUCOSE 137* 102*  BUN 8 8  CREATININE 0.87 0.85  CALCIUM 8.5* 8.5*  MG  --  2.2   Cardiac Enzymes No results for input(s): CKTOTAL, CKMB, CKMBINDEX, TROPONINI in the last 72 hours.   Telemetry    NSR/sinus tachy - Personally Reviewed  ECG     NSR, wide QRS, inverted T waves diffusely- Personally Reviewed  Radiology    Dg Chest 2 View  Result Date: 09/21/2016 CLINICAL DATA:  Cough and shortness of Breath EXAM: CHEST  2 VIEW COMPARISON:  09/14/2016 FINDINGS: Cardiac shadow is stable. Endotracheal tube and nasogastric catheter is well as a left jugular catheter have been removed. Right jugular catheter remains. Bilateral small pleural effusions are noted. Likely some underlying atelectasis is present as well. There is persistent  right peritracheal density consistent with consolidation similar to that seen on the prior exam. No acute bony abnormality is seen. IMPRESSION: Persistent consolidation in the right upper lobe. Bilateral pleural effusions. Electronically Signed   By: Inez Catalina M.D.   On: 09/21/2016 20:15   Ct Abdomen Pelvis W Contrast  Result Date: 09/20/2016 CLINICAL DATA:  Diverticulitis. EXAM: CT ABDOMEN AND PELVIS WITH CONTRAST TECHNIQUE: Multidetector CT imaging of the abdomen and pelvis was performed using the standard protocol following bolus administration of intravenous contrast. CONTRAST:  150m ISOVUE-300 IOPAMIDOL (ISOVUE-300) INJECTION 61% COMPARISON:  CT scan of September 14, 2016. FINDINGS: Lower chest: Mild bilateral pleural effusions are noted with adjacent subsegmental atelectasis. Hepatobiliary: Status post cholecystectomy. Stable intrahepatic biliary dilatation is noted which most likely is related to post cholecystectomy status. No focal abnormality is seen in the liver. Pancreas: Unremarkable. No pancreatic ductal dilatation or surrounding inflammatory changes. Spleen: Normal in size without focal abnormality. Adrenals/Urinary Tract: Adrenal glands are unremarkable. Kidneys are normal, without renal calculi, focal lesion, or hydronephrosis. Bladder is unremarkable. Stomach/Bowel: Drainage catheter noted in para diverticular abscess on prior exam has been removed. 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 Mild inflammatory changes and fluid remain within the pelvis. There is no evidence of bowel obstruction. The appendix appears normal. Vascular/Lymphatic: Aortic atherosclerosis. No enlarged abdominal or pelvic lymph nodes. Reproductive: Status post hysterectomy. No adnexal masses. Other: None. Musculoskeletal: Mild degenerative disc disease is noted at  L5-S1. IMPRESSION: Drainage catheter noted in paradiverticular abscess on prior exam has been removed. 3.9 x 2.1 cm persistent fluid collection with air-fluid level remains, concerning for abscess and possible fistulous connection to the sigmoid colon. Mild inflammatory changes in fluid remain within the pelvis. Aortic atherosclerosis. Mild bilateral pleural effusions with adjacent subsegmental atelectasis. Electronically Signed   By: JMarijo Conception M.D.   On: 09/20/2016 15:45   Dg Chest Port 1 View  Result Date: 09/22/2016 CLINICAL DATA:  Shortness of breath. EXAM: PORTABLE CHEST 1 VIEW COMPARISON:  09/21/2016 .  CT 09/14/2016. FINDINGS: Right IJ line in stable position. Persistent right upper lobe mass/ infiltrate and probable post treatment changes noted. Low lung volumes with mild basilar atelectasis. No prominent pleural effusion. No pneumothorax. Heart size normal. No acute bony abnormality . IMPRESSION: 1.  Right IJ line stable position. 2. Persistent right upper lobe mass/infiltrate and probable post treatment changes. No significant change. Low lung  volumes. Chest is stable from prior exam . 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 EXAM: CT IMAGE GUIDED FLUID DRAIN BY CATHETER MEDICATIONS: The patient is currently admitted to the hospital and receiving intravenous antibiotics. The antibiotics were administered within an appropriate time frame prior to the initiation of the procedure. ANESTHESIA/SEDATION: Fentanyl 100 mcg IV; Versed 2 mg IV Moderate Sedation Time:  15 minutes The patient was continuously monitored during the procedure by the interventional radiology nurse under my direct supervision. COMPLICATIONS: None immediate. PROCEDURE: Informed written consent was obtained from the patient after a thorough discussion of the  procedural risks, benefits and alternatives. All questions were addressed. Maximal Sterile Barrier Technique was utilized including caps, mask, sterile gowns, sterile gloves, sterile drape, hand hygiene and skin antiseptic. A timeout was performed prior to the initiation of the procedure. A planning axial CT scan was performed. The fluid and gas collection adjacent to the sigmoid colon was successfully identified. A suitable skin entry site was selected and marked. The region was sterilely prepped and draped in standard fashion using chlorhexidine skin prep. Local anesthesia was attained by infiltration with 1% lidocaine. Under intermittent CT guidance, a 20 cm 18 gauge trocar needle was advanced into the fluid and gas collection. Initial aspiration yields frankly purulent material. Therefore, the decision was made to proceed with drain placement. An Amplatz wire was advanced through the needle and coiled within the fluid and gas collection. The needle was removed and the skin tract was dilated to 10 [place]. A Cook 10.2 [place] all-purpose drainage catheter was then advanced over the wire and formed in the fluid collection. Aspiration yields approximately 10 mL thick purulent material. A sample was sent for culture. The catheter was flushed and connected to JP bulb suction. The catheter was then secured to the skin with 0 Prolene suture and an adhesive fixation device. IMPRESSION: Successful CT-guided drain placement into recurrent diverticular abscess. Electronically Signed   By: Jacqulynn Cadet M.D.   On: 09/21/2016 16:35    Cardiac Studies   Left Heart Cath and Coronary Angiography  There is severe left ventricular systolic dysfunction.  LV end diastolic pressure is moderately elevated.  There is trivial (1+) mitral regurgitation.  Ost Cx to Prox Cx lesion, 10 %stenosed.  1st Diag lesion, 25 %stenosed.   Mild nonobstructive CAD with 85% smooth narrowing in the first diagonal branch of the LAD;  10% smooth narrowing of the ostial proximal left circumflex coronary artery; and a normal dominant RCA.  Severe LV dysfunction with more pronounced mid, distal anterolateral, apical and distal inferior hypocontractility suggestive of a Takotsubo cardiomyopathy pattern with an ejection fraction of 20-25%.  RECOMMENDATION: Medical therapy.  With the patient's episode of torsade du p[ointes avoid medications with potential for QTC prolongation.   Patient Profile     70 year old female with a past medical history significant for nonobstructive CAD (cath 2006 and again in 2013 with normal LV function, mild, non-obstructive CAD noted), HTN, DM 2, HLD, COPD, former smoker,Hx of non-small cell lung cater s/p chemo and radiation, hx of diverticulitis who initially presented on 11/19 with LLQ pain and found to have diverticulitis with abscess formation. Drain placed. 11/22 am when into cardiac arrest - [place] - VF-VT. Intubated and transferred to the ICU.   Review of tele shows that she initially went  into polymorphic VT without long short coupling Had ROSC in 6-8 minutes. Curiously Mag was 2.1 following the code.  Initial trop 0.12. EKG with no acute ST changes. Nonspecific IVCD new since last tracing. ECHO with EF 30-35% and mid-distal anteroseptal akinesis. No RV strain noted. Previously no know history of LV dysfunction.   CT scan of the abdomen done yesterday showed improving clinical findings. 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  Assessment & Plan    1. Cardiac arrest with polymorphic VT: Seen by EP, there is borderline QT prolongation but no long short coupling. No further VT. ? Related to stress induced cardiomyopathy.   She underwent left heart cath that showed now obstructive CAD, takotsubo cardiomyopathy. EF is 25%. Plan beta blocker therapy.    2. Diverticular abscess: Per primary team, drain  removed. CT shows reaccumulation with abscess. Drain placed again yesterday.  3. Severe LV dysfunction: EF 25%. Most likely stress induced cardiomyopathy. Takotsubo. Started on ACE-I, also on metoprolol. Will slowly titrate as BP allows.  Repeat Echo 6-8 weeks.  4. HTN: Well controlled on current regimen.   Patient received IV lasix last night and this am for unclear reasons. No CHF on CXR. Going for CT chest today. Will increase beta blocker therapy.     Signed,  Claudia Alvizo [place], [place] 09/22/2016 10:34 AM

## 2016-09-22 NOTE — Progress Notes (Signed)
8 Days Post-Op  Subjective: Chest hurts, states abd does not, having bms, tol diet  Objective: Vital signs in last 24 hours: Temp:  [97.7 F (36.5 C)-98.4 F (36.9 C)] 98.3 F (36.8 C) (11/30 0643) Pulse Rate:  [89-104] 103 (11/30 0643) Resp:  [12-21] 18 (11/30 0643) BP: (129-147)/(50-89) 134/68 (11/30 0643) SpO2:  [99 %-100 %] 100 % (11/30 0643) Weight:  [74.4 kg (164 lb)] 74.4 kg (164 lb) (11/30 0643) Last BM Date: 09/22/16  Intake/Output from previous day: 11/29 0701 - 11/30 0700 In: 0  Out: 1 [Stool:1] Intake/Output this shift: Total I/O In: -  Out: 1 [Urine:1]  GI: soft mild tender llq, drain in place llq  Lab Results:   Recent Labs  09/21/16 0331 09/22/16 0532  WBC 12.1* 10.4  HGB 9.6* 9.6*  HCT 29.2* 29.2*  PLT 189 211   BMET  Recent Labs  09/21/16 0331 09/22/16 0532  NA 140 139  K 3.2* 3.8  CL 102 103  CO2 29 25  GLUCOSE 137* 102*  BUN 8 8  CREATININE 0.87 0.85  CALCIUM 8.5* 8.5*   PT/INR  Recent Labs  09/21/16 0815  LABPROT 14.5  INR 1.12   ABG No results for input(s): PHART, HCO3 in the last 72 hours.  Invalid input(s): PCO2, PO2  Studies/Results: Dg Chest 2 View  Result Date: 09/21/2016 CLINICAL DATA:  Cough and shortness of Breath EXAM: CHEST  2 VIEW COMPARISON:  09/14/2016 FINDINGS: Cardiac shadow is stable. Endotracheal tube and nasogastric catheter is well as a left jugular catheter have been removed. Right jugular catheter remains. Bilateral small pleural effusions are noted. Likely some underlying atelectasis is present as well. There is persistent right peritracheal density consistent with consolidation similar to that seen on the prior exam. No acute bony abnormality is seen. IMPRESSION: Persistent consolidation in the right upper lobe. Bilateral pleural effusions. Electronically Signed   By: Inez Catalina M.D.   On: 09/21/2016 20:15   Ct Abdomen Pelvis W Contrast  Result Date: 09/20/2016 CLINICAL DATA:  Diverticulitis.  EXAM: CT ABDOMEN AND PELVIS WITH CONTRAST TECHNIQUE: Multidetector CT imaging of the abdomen and pelvis was performed using the standard protocol following bolus administration of intravenous contrast. CONTRAST:  15m ISOVUE-300 IOPAMIDOL (ISOVUE-300) INJECTION 61% COMPARISON:  CT scan of September 14, 2016. FINDINGS: Lower chest: Mild bilateral pleural effusions are noted with adjacent subsegmental atelectasis. Hepatobiliary: Status post cholecystectomy. Stable intrahepatic biliary dilatation is noted which most likely is related to post cholecystectomy status. No focal abnormality is seen in the liver. Pancreas: Unremarkable. No pancreatic ductal dilatation or surrounding inflammatory changes. Spleen: Normal in size without focal abnormality. Adrenals/Urinary Tract: Adrenal glands are unremarkable. Kidneys are normal, without renal calculi, focal lesion, or hydronephrosis. Bladder is unremarkable. Stomach/Bowel: Drainage catheter noted in para diverticular abscess on prior exam has been removed. 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 Mild inflammatory changes and fluid remain within the pelvis. There is no evidence of bowel obstruction. The appendix appears normal. Vascular/Lymphatic: Aortic atherosclerosis. No enlarged abdominal or pelvic lymph nodes. Reproductive: Status post hysterectomy. No adnexal masses. Other: None. Musculoskeletal: Mild degenerative disc disease is noted at L5-S1. IMPRESSION: Drainage catheter noted in paradiverticular abscess on prior exam has been removed. 3.9 x 2.1 cm persistent fluid collection with air-fluid level remains, concerning for abscess and possible fistulous connection to the sigmoid colon. Mild inflammatory changes  in fluid remain within the pelvis. Aortic atherosclerosis. Mild bilateral pleural effusions with adjacent  subsegmental atelectasis. Electronically Signed   By: Marijo Conception, M.D.   On: 09/20/2016 15:45   Dg Chest Port 1 View  Result Date: 09/22/2016 CLINICAL DATA:  Shortness of breath. EXAM: PORTABLE CHEST 1 VIEW COMPARISON:  09/21/2016 .  CT 09/14/2016. FINDINGS: Right IJ line in stable position. Persistent right upper lobe mass/ infiltrate and probable post treatment changes noted. Low lung volumes with mild basilar atelectasis. No prominent pleural effusion. No pneumothorax. Heart size normal. No acute bony abnormality . IMPRESSION: 1.  Right IJ line stable position. 2. Persistent right upper lobe mass/infiltrate and probable post treatment changes. No significant change. Low lung volumes. Chest is stable from prior exam . 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 EXAM: CT IMAGE GUIDED FLUID DRAIN BY CATHETER MEDICATIONS: The patient is currently admitted to the hospital and receiving intravenous antibiotics. The antibiotics were administered within an appropriate time frame prior to the initiation of the procedure. ANESTHESIA/SEDATION: Fentanyl 100 mcg IV; Versed 2 mg IV Moderate Sedation Time:  15 minutes The patient was continuously monitored during the procedure by the interventional radiology nurse under my direct supervision. COMPLICATIONS: None immediate. PROCEDURE: Informed written consent was obtained from the patient after a thorough discussion of the procedural risks, benefits and alternatives. All questions were addressed. Maximal Sterile Barrier Technique was utilized including caps, mask, sterile gowns, sterile gloves, sterile drape, hand hygiene and skin antiseptic. A timeout was performed prior to the initiation of the procedure. A planning axial CT scan was performed. The fluid and gas collection adjacent to the sigmoid colon was  successfully identified. A suitable skin entry site was selected and marked. The region was sterilely prepped and draped in standard fashion using chlorhexidine skin prep. Local anesthesia was attained by infiltration with 1% lidocaine. Under intermittent CT guidance, a 20 cm 18 gauge trocar needle was advanced into the fluid and gas collection. Initial aspiration yields frankly purulent material. Therefore, the decision was made to proceed with drain placement. An Amplatz wire was advanced through the needle and coiled within the fluid and gas collection. The needle was removed and the skin tract was dilated to 10 [place]. A Cook 10.2 [place] all-purpose drainage catheter was then advanced over the wire and formed in the fluid collection. Aspiration yields approximately 10 mL thick purulent material. A sample was sent for culture. The catheter was flushed and connected to JP bulb suction. The catheter was then secured to the skin with 0 Prolene suture and an adhesive fixation device. IMPRESSION: Successful CT-guided drain placement into recurrent diverticular abscess. Electronically Signed   By: Jacqulynn Cadet M.D.   On: 09/21/2016 16:35    Anti-infectives: Anti-infectives    Start     Dose/Rate Route Frequency Ordered Stop   09/21/16 1400  Ampicillin-Sulbactam (UNASYN) 3 g in sodium chloride 0.9 % 100 mL IVPB     3 g 200 mL/hr over 30 Minutes Intravenous Every 6 hours 09/21/16 1348     09/15/16 1000  anidulafungin (ERAXIS) 100 mg in sodium chloride 0.9 % 100 mL IVPB  Status:  Discontinued     100 mg over 90 Minutes Intravenous Every 24 hours 09/14/16 0906 09/15/16 1411   09/14/16 1000  anidulafungin (ERAXIS) 200 mg in sodium chloride 0.9 % 200  mL IVPB     200 mg over 180 Minutes Intravenous  Once 09/14/16 0841 09/14/16 2255   09/14/16 0800  vancomycin (VANCOCIN) IVPB 1000 mg/200 mL premix  Status:  Discontinued     1,000 mg 200 mL/hr over 60 Minutes Intravenous Every 12 hours 09/14/16 0737  09/16/16 0903   09/11/16 2200  piperacillin-tazobactam (ZOSYN) IVPB 3.375 g  Status:  Discontinued     3.375 g 12.5 mL/hr over 240 Minutes Intravenous Every 8 hours 09/11/16 1840 09/21/16 1348   09/11/16 1400  piperacillin-tazobactam (ZOSYN) IVPB 3.375 g     3.375 g 100 mL/hr over 30 Minutes Intravenous  Once 09/11/16 1346 09/11/16 1502      Assessment/Plan: Diverticulitis with abscess -IR drain yesterday, abdomen better, will follow drain output, cont abx (unasyn due to sensitivity), will likely need repeat scan at some point depending on how drain output goes - still think she will resolve without surgery especially given recent arrest and comorbidities - will need colonoscopy 6 weeks after discharge Cardiac arrest/CHF: per cardiology ID - unasyn VTE - lovenox FEN - low fiber diet  I discussed over phone yesterday plan of care with her husband Raford Pitcher 09/22/2016

## 2016-09-23 DIAGNOSIS — I639 Cerebral infarction, unspecified: Secondary | ICD-10-CM

## 2016-09-23 HISTORY — DX: Cerebral infarction, unspecified: I63.9

## 2016-09-23 LAB — BASIC METABOLIC PANEL
Anion gap: 11 (ref 5–15)
BUN: 10 mg/dL (ref 6–20)
CHLORIDE: 94 mmol/L — AB (ref 101–111)
CO2: 32 mmol/L (ref 22–32)
CREATININE: 0.99 mg/dL (ref 0.44–1.00)
Calcium: 8.5 mg/dL — ABNORMAL LOW (ref 8.9–10.3)
GFR calc Af Amer: >60 mL/min (ref >60)
GFR calc non Af Amer: 57 mL/min — ABNORMAL LOW (ref >60)
Glucose, Bld: 213 mg/dL — ABNORMAL HIGH (ref 65–99)
POTASSIUM: 3 mmol/L — AB (ref 3.5–5.1)
Sodium: 137 mmol/L (ref 135–145)

## 2016-09-23 LAB — CBC
HEMATOCRIT: 29.6 % — AB (ref 36.0–46.0)
HEMOGLOBIN: 9.6 g/dL — AB (ref 12.0–15.0)
MCH: 29.4 pg (ref 26.0–34.0)
MCHC: 32.4 g/dL (ref 30.0–36.0)
MCV: 90.8 fL (ref 78.0–100.0)
Platelets: 243 10*3/uL (ref 150–400)
RBC: 3.26 MIL/uL — AB (ref 3.87–5.11)
RDW: 15.2 % (ref 11.5–15.5)
WBC: 9.7 10*3/uL (ref 4.0–10.5)

## 2016-09-23 LAB — GLUCOSE, CAPILLARY
GLUCOSE-CAPILLARY: 251 mg/dL — AB (ref 65–99)
GLUCOSE-CAPILLARY: 189 mg/dL — AB (ref 65–99)
Glucose-Capillary: 155 mg/dL — ABNORMAL HIGH (ref 65–99)
Glucose-Capillary: 297 mg/dL — ABNORMAL HIGH (ref 65–99)

## 2016-09-23 MED ORDER — FUROSEMIDE 10 MG/ML IJ SOLN
40.0000 mg | Freq: Once | INTRAMUSCULAR | Status: AC
  Administered 2016-09-23: 40 mg via INTRAVENOUS
  Filled 2016-09-23: qty 4

## 2016-09-23 MED ORDER — AMOXICILLIN-POT CLAVULANATE 875-125 MG PO TABS
1.0000 | ORAL_TABLET | Freq: Two times a day (BID) | ORAL | Status: DC
  Administered 2016-09-23 – 2016-09-27 (×8): 1 via ORAL
  Filled 2016-09-23 (×8): qty 1

## 2016-09-23 MED ORDER — POTASSIUM CHLORIDE CRYS ER 20 MEQ PO TBCR
40.0000 meq | EXTENDED_RELEASE_TABLET | Freq: Four times a day (QID) | ORAL | Status: AC
  Administered 2016-09-23 (×2): 40 meq via ORAL
  Filled 2016-09-23 (×2): qty 2

## 2016-09-23 MED ORDER — POTASSIUM CHLORIDE CRYS ER 20 MEQ PO TBCR
20.0000 meq | EXTENDED_RELEASE_TABLET | Freq: Once | ORAL | Status: AC
  Administered 2016-09-23: 20 meq via ORAL
  Filled 2016-09-23: qty 1

## 2016-09-23 NOTE — Clinical Social Work Placement (Signed)
   CLINICAL SOCIAL WORK PLACEMENT  NOTE  Date:  09/23/2016  Patient Details  Name: [name] MRN: [id number] Date of Birth: 04/10/1946  Clinical Social Work is seeking post-discharge placement for this patient at the [place] level of care (*CSW will initial, date and re-position this form in  chart as items are completed):  Yes   Patient/family provided with [place] Work Department's list of facilities offering this level of care within the geographic area requested by the patient (or if unable, by the patient's family).  Yes   Patient/family informed of their freedom to choose among providers that offer the needed level of care, that participate in Medicare, Medicaid or managed care program needed by the patient, have an available bed and are willing to accept the patient.  Yes   Patient/family informed of 's ownership interest in [facility] and [facility], as well as of the fact that they are under no obligation to receive care at these facilities.  PASRR submitted to EDS on       PASRR number received on       Existing PASRR number confirmed on       FL2 transmitted to all facilities in geographic area requested by pt/family on       FL2 transmitted to all facilities within larger geographic area on       Patient informed that his/her managed care company has contracts with or will negotiate with certain facilities, including the following:            Patient/family informed of bed offers received.  Patient chooses bed at       Physician recommends and patient chooses bed at      Patient to be transferred to   on  .  Patient to be transferred to facility by       Patient family notified on   of transfer.  Name of family member notified:        PHYSICIAN Please sign FL2     Additional Comment:    _______________________________________________ Rhea Pink, [name],  [place] [phone]

## 2016-09-23 NOTE — Progress Notes (Signed)
Physical Therapy Treatment Patient Details Name: [name] MRN: [id number] DOB: 1946/09/18 Today's Date: 09/23/2016    History of Present Illness [name] an 70 y.o.femalepast medical history of diverticulitis back in August 2017 who presents today with left lower quadrant pain that started the day prior to admission CT scan of the abdomen pelvis showed a sigmoid: Abscess 4 x 2 cm with a small fossae of pneumoperitoneum in the area.S/p IR drain of abcess 11/20.    PT Comments    Patient progressing with mobility and endurance.  Still limited by pain with bed mobility and fatigues quickly and likely to benefit from STSNF level rehab at d/c.  PT to follow acutely.   Follow Up Recommendations  SNF;Supervision/Assistance - 24 hour     Equipment Recommendations  None recommended by PT    Recommendations for Other Services       Precautions / Restrictions Precautions Precautions: Fall    Mobility  Bed Mobility Overal bed mobility: Needs Assistance Bed Mobility: Sit to Supine       Sit to supine: Mod assist   General bed mobility comments: assist for legs into bed and to scoot to [facility] with cues and use of rails  Transfers Overall transfer level: Needs assistance Equipment used: Rolling walker (2 wheeled) Transfers: Sit to/from [facility] Sit to Stand: Supervision Stand pivot transfers: Min assist       General transfer comment: cues for hand placement; assist to/from [facility]  Ambulation/Gait Ambulation/Gait assistance: Min guard Ambulation Distance (Feet): 120 Feet Assistive device: Rolling walker (2 wheeled) Gait Pattern/deviations: Step-through pattern;Decreased stride length;Shuffle     General Gait Details: assist and cues to keep walker close   Stairs            Wheelchair Mobility    Modified Rankin (Stroke Patients Only)       Balance Overall balance assessment: Needs assistance         Standing balance  support: Bilateral upper extremity supported Standing balance-Leahy Scale: Poor Standing balance comment: UE support for balance                    Cognition Arousal/Alertness: Awake/alert Behavior During Therapy: WFL for tasks assessed/performed Overall Cognitive Status: Within Functional Limits for tasks assessed                      Exercises      General Comments        Pertinent Vitals/Pain Pain Assessment: No/denies pain Faces Pain Scale: Hurts even more Pain Location: ribs with bed mobility Pain Descriptors / Indicators: Sore;Aching;Grimacing;Guarding Pain Intervention(s): Monitored during session;Repositioned    Home Living                      Prior Function            PT Goals (current goals can now be found in the care plan section) Progress towards PT goals: Progressing toward goals    Frequency    Min 3X/week      PT Plan Current plan remains appropriate    Co-evaluation             End of Session Equipment Utilized During Treatment: Gait belt Activity Tolerance: Patient limited by fatigue Patient left: in bed;with call bell/phone within reach;with bed alarm set     Time: 0737-1062 PT Time Calculation (min) (ACUTE ONLY): 27 min  Charges:  $Gait Training: 8-22 mins $Therapeutic Activity:  8-22 mins                    G Codes:      Reginia Naas October 06, 2016, 4:30 PM  [place], [place] 10/06/2016

## 2016-09-23 NOTE — Progress Notes (Addendum)
9 Days Post-Op  Subjective: Feels fine, weak, no n/v tol diet, sore at drain site  Objective: Vital signs in last 24 hours: Temp:  [97.9 F (36.6 C)-98.2 F (36.8 C)] 97.9 F (36.6 C) (12/01 0449) Pulse Rate:  [80-117] 80 (12/01 0449) Resp:  [18-20] 18 (12/01 0449) BP: (125-132)/(52-93) 130/52 (12/01 0449) SpO2:  [92 %-97 %] 97 % (12/01 0449) Weight:  [71.4 kg (157 lb 6.4 oz)] 71.4 kg (157 lb 6.4 oz) (12/01 0449) Last BM Date: 09/22/16  Intake/Output from previous day: 11/30 0701 - 12/01 0700 In: 480 [P.O.:480] Out: 2561 [Urine:2561] Intake/Output this shift: Total I/O In: 240 [P.O.:240] Out: -   GI: drain with murky fluid not alot of ouput in bulb, bs present, tender at drain site but o/w soft  Lab Results:   Recent Labs  09/22/16 0532 09/23/16 0213  WBC 10.4 9.7  HGB 9.6* 9.6*  HCT 29.2* 29.6*  PLT 211 243   BMET  Recent Labs  09/22/16 0532 09/23/16 0213  NA 139 137  K 3.8 3.0*  CL 103 94*  CO2 25 32  GLUCOSE 102* 213*  BUN 8 10  CREATININE 0.85 0.99  CALCIUM 8.5* 8.5*   PT/INR  Recent Labs  09/21/16 0815  LABPROT 14.5  INR 1.12   ABG No results for input(s): PHART, HCO3 in the last 72 hours.  Invalid input(s): PCO2, PO2  Studies/Results: Dg Chest 2 View  Result Date: 09/21/2016 CLINICAL DATA:  Cough and shortness of Breath EXAM: CHEST  2 VIEW COMPARISON:  09/14/2016 FINDINGS: Cardiac shadow is stable. Endotracheal tube and nasogastric catheter is well as a left jugular catheter have been removed. Right jugular catheter remains. Bilateral small pleural effusions are noted. Likely some underlying atelectasis is present as well. There is persistent right peritracheal density consistent with consolidation similar to that seen on the prior exam. No acute bony abnormality is seen. IMPRESSION: Persistent consolidation in the right upper lobe. Bilateral pleural effusions. Electronically Signed   By: Inez Catalina M.D.   On: 09/21/2016 20:15   Ct  Chest Wo Contrast  Result Date: 09/22/2016 CLINICAL DATA:  Continued shortness of breath and coughing with chest pain for several days. History of lung cancer EXAM: CT CHEST WITHOUT CONTRAST TECHNIQUE: Multidetector CT imaging of the chest was performed following the standard protocol without IV contrast. COMPARISON:  09/14/2016 FINDINGS: Cardiovascular: Slight increase in the small pericardial effusion. Coronary artery calcification is noted. Atherosclerotic calcification is noted in the wall of the thoracic aorta. Right-sided central line tip is positioned in the distal SVC. Mediastinum/Nodes: Persistent abnormal soft tissue attenuation identified in the right hilum and right suprahilar region. The esophagus has normal imaging features. There is no axillary lymphadenopathy. Lungs/Pleura: Airspace consolidation in the medial right upper lung and apex is not substantially changed interval, presumably representing post radiation fibrosis. The asymmetric, right greater than left ground-glass attenuation seen previously has improved in the interval as has the interlobular septal thickening. Small to moderate right pleural effusion and small left pleural effusion are slightly progressed in the interval. Upper Abdomen: Unremarkable. Musculoskeletal: Bone windows reveal no worrisome lytic or sclerotic osseous lesions. Apparent nondisplaced upper sternal fracture unchanged. IMPRESSION: 1. Post treatment changes medial right upper lobe involving the right hilum and apex are not substantially changed in the interval. 2. Scattered ground-glass attenuation with interlobular septal thickening seen previously has decreased in the interval. 3. Slight progression of bilateral pleural effusions. Electronically Signed   By: Verda Cumins.D.  On: 09/22/2016 11:32   Dg Chest Port 1 View  Result Date: 09/22/2016 CLINICAL DATA:  Shortness of breath. EXAM: PORTABLE CHEST 1 VIEW COMPARISON:  09/21/2016 .  CT 09/14/2016.  FINDINGS: Right IJ line in stable position. Persistent right upper lobe mass/ infiltrate and probable post treatment changes noted. Low lung volumes with mild basilar atelectasis. No prominent pleural effusion. No pneumothorax. Heart size normal. No acute bony abnormality . IMPRESSION: 1.  Right IJ line stable position. 2. Persistent right upper lobe mass/infiltrate and probable post treatment changes. No significant change. Low lung volumes. Chest is stable from prior exam . 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 EXAM: CT IMAGE GUIDED FLUID DRAIN BY CATHETER MEDICATIONS: The patient is currently admitted to the hospital and receiving intravenous antibiotics. The antibiotics were administered within an appropriate time frame prior to the initiation of the procedure. ANESTHESIA/SEDATION: Fentanyl 100 mcg IV; Versed 2 mg IV Moderate Sedation Time:  15 minutes The patient was continuously monitored during the procedure by the interventional radiology nurse under my direct supervision. COMPLICATIONS: None immediate. PROCEDURE: Informed written consent was obtained from the patient after a thorough discussion of the procedural risks, benefits and alternatives. All questions were addressed. Maximal Sterile Barrier Technique was utilized including caps, mask, sterile gowns, sterile gloves, sterile drape, hand hygiene and skin antiseptic. A timeout was performed prior to the initiation of the procedure. A planning axial CT scan was performed. The fluid and gas collection adjacent to the sigmoid colon was successfully identified. A suitable skin entry site was selected and marked. The region was sterilely prepped and draped in standard fashion using chlorhexidine skin prep. Local anesthesia was attained by infiltration with 1% lidocaine. Under intermittent CT  guidance, a 20 cm 18 gauge trocar needle was advanced into the fluid and gas collection. Initial aspiration yields frankly purulent material. Therefore, the decision was made to proceed with drain placement. An Amplatz wire was advanced through the needle and coiled within the fluid and gas collection. The needle was removed and the skin tract was dilated to 10 [place]. A Cook 10.2 [place] all-purpose drainage catheter was then advanced over the wire and formed in the fluid collection. Aspiration yields approximately 10 mL thick purulent material. A sample was sent for culture. The catheter was flushed and connected to JP bulb suction. The catheter was then secured to the skin with 0 Prolene suture and an adhesive fixation device. IMPRESSION: Successful CT-guided drain placement into recurrent diverticular abscess. Electronically Signed   By: Jacqulynn Cadet M.D.   On: 09/21/2016 16:35    Anti-infectives: Anti-infectives    Start     Dose/Rate Route Frequency Ordered Stop   09/21/16 1400  Ampicillin-Sulbactam (UNASYN) 3 g in sodium chloride 0.9 % 100 mL IVPB     3 g 200 mL/hr over 30 Minutes Intravenous Every 6 hours 09/21/16 1348     09/15/16 1000  anidulafungin (ERAXIS) 100 mg in sodium chloride 0.9 % 100 mL IVPB  Status:  Discontinued     100 mg over 90 Minutes Intravenous Every 24 hours 09/14/16 0906 09/15/16 1411   09/14/16 1000  anidulafungin (ERAXIS) 200 mg in sodium chloride 0.9 % 200 mL IVPB     200 mg over 180 Minutes Intravenous  Once 09/14/16 0841 09/14/16 2255   09/14/16 0800  vancomycin (VANCOCIN) IVPB 1000 mg/200  mL premix  Status:  Discontinued     1,000 mg 200 mL/hr over 60 Minutes Intravenous Every 12 hours 09/14/16 0737 09/16/16 0903   09/11/16 2200  piperacillin-tazobactam (ZOSYN) IVPB 3.375 g  Status:  Discontinued     3.375 g 12.5 mL/hr over 240 Minutes Intravenous Every 8 hours 09/11/16 1840 09/21/16 1348   09/11/16 1400  piperacillin-tazobactam (ZOSYN) IVPB 3.375 g      3.375 g 100 mL/hr over 30 Minutes Intravenous  Once 09/11/16 1346 09/11/16 1502      Assessment/Plan: Diverticulitis with abscess -IR drain (#2) 2 days ago for second abscess abdomen better, will follow drain output, cont abx (unasyn due to sensitivity)- can change to po abx per hospitalists, will likely need repeat scan at some point depending on how drain output goes and injection, this can likely be early next week given appearance of drain output - still think she will resolve without surgery especially given recent arrest and comorbidities - will need colonoscopy at some point also Cardiac arrest/CHF: per cardiology ID - unasyn, can change to oral abx plan for 7d more VTE - lovenox FEN - low fiber diet  [name] 09/23/2016

## 2016-09-23 NOTE — Progress Notes (Addendum)
PROGRESS NOTE    [name]  MGQ:[id number] DOB: 01/15/1946 DOA: 09/11/2016   PCP: [name], MD   Brief Narrative:   70 y.o. female with hx diverticulitis, CAD, COPD, DM, HTN, hx NSCLC in 2012, initially presented 11/19 with abd pain.  Initial w/u revealed diverticulitis with abscess.  She was seen by surgery, started on zosyn and had IR drain placed 11/20.  She was improving slowly until 11/22 when she was found to be in SVT 190's, then subsequently had PEA arrest with total 6 mins CPR, Defib x 2 for VFib. Intubated during code, awake post arrest but remained hypotensive.   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  Assessment & Plan:   Diverticular abscess -  Had intra-abdominal drain placed by IR on 09/12/2016 and removed on 09/19/2016, she is on IV antibiotics starting date 09/11/2016, was transitioned to Unasyn on 09/21/2016 and to Augmentin on 09/23/2016, repeat imaging shows reaccumulation of abscess and she had another drain placement on 09/21/2016. Surgery and IR following , she is clinically better tolerating soft diet, now agreeable to SNF and will be discharged to SNF once bed is arranged. We will discharge her on oral Augmentin with outpatient surgery and IR follow-up. Outpatient colonoscopy by Dr. Ardis Hughs.   Cardiac arrest with polymorphic VT EF 25%. With mild acute on chronic systolic CHF. - total 6 mins CPR, Defib x 2 for VFib, intubated during code.  Cards saw the patient, she underwent left heart catheterization on 09/14/2016 with clean coronaries, presentation suspicious for Takotsubo cardiac myopathy. Cards and EP recommend continuing beta blocker and Ace. She developed some shortness of breath and rales on 09/22/2016 and a trial of IV Lasix will be given, cardiology following, We will continue to monitor electrolytes, repeat echo in 6-8 weeks     Acute respiratory failure ([place]) - s/p polymorphic V Tach arrest, extubated 11/23, improved,  continue to wean O2 as tolerated  Mild acute on chronic systolic heart failure EF 25% with shortness of breath on 09/21/2016. Diagnosed clinically, much better after couple of doses of Lasix. Now compensated off oxygen and not short of breath.   Hypokalemia - supplemented will monitor  Acute kidney injury  - resolved    Hypertension, essential  - On lisinopril and metoprolol and BP reasonably controlled     COPD - stable, nebs PRN  Chronic right-sided lung opacity. On CT scan consistent with previous radiation-related changes, no productive cough or fevers, will monitor.  DM (diabetes mellitus), type 2, uncontrolled with long term use insulin ([place]) - on SSI now - on insulin 70/30 and metformin at home but held here until oral intake improves  CBG (last 3)   Recent Labs  09/22/16 1640 09/22/16 2200 09/23/16 0637  GLUCAP 273* 199* 155*    Lab Results  Component Value Date   HGBA1C 7.1 (H) 09/11/2016     DVT prophylaxis: Hep SQ Code Status: Full  Family Communication: none at bedside Disposition Plan: ready when consultants clear, also PT eval pending   CONSULTANTS:  CCS  IR  Cards  EP  PROCEDURES:  OETT 7.5 11/22 >> 11/23  R FEM ART LINE 11/22 >> 11/24  R IJ CVL 11/22 >>  OGT 11/22 >> 11/23  FOLEY 11/22 >>    STUDIES:   CT abd/pelvis 11/19: Focal diverticulitis of the mid sigmoid colon with adjacent 2.5 x 4.1 cm abscess  and foci of pneumoperitoneum in this area. Small amount of complex free fluid within the pelvis.  CT chest/abd/pelvis 11/22: Radiation fibrosis changes in medial RIGHT upper lobe extending to the superior RIGHT hilum. Additional RIGHT upper lobe and scattered RIGHT lung infiltrates question pneumonia versus less likely asymmetric edema. Small BILATERAL pleural effusions RIGHT greater than LEFT with minimal adjacent compressive atelectasis  of the lower lobes. Improved diverticulitis changes with resolution of the previously identified abscess collection adjacent to the sigmoid colon. Fractures of the sternum and the anterior RIGHT third and fifth ribs suspect related to CPR. Small pericardial fusion.  TTE 11/22:  LV EF 30-35% w/ akinesis of mid-apicalanteroseptalmycoardium. Grade 1 diastolic dysfunciton. LA & RA normal in size. RV normal in size & function. Trivial aortic regurg w/o stenosis. Trivial mitral regurg. No pulmonic regurg. Mild tricuspid regurg. Trivial pericardial effusion.   Cath11/22/2017: Severe LV dysfunction (EF roughly 25%) with moderately-severely elevated LVEDP (25 mmHg). Mild nonobstructive CAD with 85% smooth narrowing of L2 small-caliber D1. -- Suggestive of Takotsubo Cardiomyopathy.  MICROBIOLOGY:  Blood Ctx x2 11/19 >>  Urine Ctx 11/19:  Multiple species present  Diverticular Abscess Ctx 11/20:  Klebsiella pneumoniae (resistant Ampicillin)  MRSA PCR 11/22:  Negative  Blood Ctx x2 11/22 >>  ANTIBIOTICS:  Anti-infectives    Start     Dose/Rate Route Frequency Ordered Stop   09/23/16 1400  amoxicillin-clavulanate (AUGMENTIN) 875-125 MG per tablet 1 tablet     1 tablet Oral Every 12 hours 09/23/16 0928     09/21/16 1400  Ampicillin-Sulbactam (UNASYN) 3 g in sodium chloride 0.9 % 100 mL IVPB  Status:  Discontinued     3 g 200 mL/hr over 30 Minutes Intravenous Every 6 hours 09/21/16 1348 09/23/16 0928   09/15/16 1000  anidulafungin (ERAXIS) 100 mg in sodium chloride 0.9 % 100 mL IVPB  Status:  Discontinued     100 mg over 90 Minutes Intravenous Every 24 hours 09/14/16 0906 09/15/16 1411   09/14/16 1000  anidulafungin (ERAXIS) 200 mg in sodium chloride 0.9 % 200 mL IVPB     200 mg over 180 Minutes Intravenous  Once 09/14/16 0841 09/14/16 2255   09/14/16 0800  vancomycin (VANCOCIN) IVPB 1000 mg/200 mL premix  Status:  Discontinued     1,000 mg 200 mL/hr over 60 Minutes Intravenous Every 12 hours  09/14/16 0737 09/16/16 0903   09/11/16 2200  piperacillin-tazobactam (ZOSYN) IVPB 3.375 g  Status:  Discontinued     3.375 g 12.5 mL/hr over 240 Minutes Intravenous Every 8 hours 09/11/16 1840 09/21/16 1348   09/11/16 1400  piperacillin-tazobactam (ZOSYN) IVPB 3.375 g     3.375 g 100 mL/hr over 30 Minutes Intravenous  Once 09/11/16 1346 09/11/16 1502      Subjective:  Patient sitting in chair, feels better today, shortness of breath has resolved, no chest or abdominal pain, mild pressure around the site of abdominal drain.   Objective: Vitals:   09/22/16 1149 09/22/16 1237 09/22/16 2036 09/23/16 0449  BP: 125/64 128/62 (!) 132/93 (!) 130/52  Pulse: (!) 117 (!) 115 (!) 111 80  Resp:  '19 20 18  '$ Temp:  98.2 F (36.8 C) 98.2 F (36.8 C) 97.9 F (36.6 C)  TempSrc:  Oral Oral Oral  SpO2: 93% 92% 93% 97%  Weight:    71.4 kg (157 lb 6.4 oz)  Height:        Intake/Output Summary (Last 24 hours) at 09/23/16 0931 Last data filed at 09/23/16 0830  Gross  per 24 hour  Intake              480 ml  Output             2560 ml  Net            -2080 ml   Filed Weights   09/21/16 0558 09/22/16 0643 09/23/16 0449  Weight: 75.1 kg (165 lb 8 oz) 74.4 kg (164 lb) 71.4 kg (157 lb 6.4 oz)    Examination:  General exam: Appears calm and comfortable, AAOx3, no distress today  Respiratory system: faint basilar crackles. Cardiovascular system: S1 & S2 heard, RRR.  Gastrointestinal system: Abdomen is nondistended, soft, Non tender  Central nervous system: Alert and oriented. No focal neurological deficits. Extremities: Symmetric 5 x 5 power. Skin: No rashes, lesions or ulcers  Data Reviewed: I have personally reviewed following labs and imaging studies  CBC:  Recent Labs Lab 09/18/16 0509 09/20/16 0815 09/21/16 0331 09/22/16 0532 09/23/16 0213  WBC 8.9 12.8* 12.1* 10.4 9.7  HGB 10.2* 9.9* 9.6* 9.6* 9.6*  HCT 31.1* 30.5* 29.2* 29.2* 29.6*  MCV 90.1 90.5 92.4 92.4 90.8  PLT 174 161  189 211 301   Basic Metabolic Panel:  Recent Labs Lab 09/17/16 0500 09/18/16 0508 09/20/16 0815 09/21/16 0331 09/22/16 0532 09/23/16 0213  NA 140 140 140 140 139 137  K 3.0* 3.7 3.8 3.2* 3.8 3.0*  CL 102 100* 103 102 103 94*  CO2 29 33* '31 29 25 '$ 32  GLUCOSE 122* 115* 123* 137* 102* 213*  BUN '10 11 11 8 8 10  '$ CREATININE 1.02* 0.95 0.89 0.87 0.85 0.99  CALCIUM 8.4* 8.3* 8.4* 8.5* 8.5* 8.5*  MG 2.1  --   --   --  2.2  --    Liver Function Tests: No results for input(s): AST, ALT, ALKPHOS, BILITOT, PROT, ALBUMIN in the last 168 hours. Coagulation Profile:  Recent Labs Lab 09/21/16 0815  INR 1.12   Cardiac Enzymes:  Recent Labs Lab 09/18/16 1100  TROPONINI 0.03*   CBG:  Recent Labs Lab 09/22/16 0621 09/22/16 1122 09/22/16 1640 09/22/16 2200 09/23/16 0637  GLUCAP 109* 162* 273* 199* 155*   Urine analysis:    Component Value Date/Time   COLORURINE YELLOW 09/14/2016 1031   APPEARANCEUR HAZY (A) 09/14/2016 1031   LABSPEC 1.011 09/14/2016 1031   PHURINE 7.0 09/14/2016 1031   GLUCOSEU 100 (A) 09/14/2016 1031   HGBUR LARGE (A) 09/14/2016 1031   BILIRUBINUR NEGATIVE 09/14/2016 1031   KETONESUR NEGATIVE 09/14/2016 1031   PROTEINUR 100 (A) 09/14/2016 1031   UROBILINOGEN 1.0 02/03/2013 0912   NITRITE NEGATIVE 09/14/2016 1031   LEUKOCYTESUR NEGATIVE 09/14/2016 1031   Recent Results (from the past 240 hour(s))  Culture, blood (Routine X 2) w Reflex to ID Panel     Status: None   Collection Time: 09/11/16 11:50 AM  Result Value Ref Range Status   Specimen Description BLOOD RIGHT ANTECUBITAL  Final   Special Requests BOTTLES DRAWN AEROBIC AND ANAEROBIC 5CC  Final   Culture NO GROWTH 5 DAYS  Final   Report Status 09/16/2016 FINAL  Final  Culture, blood (Routine X 2) w Reflex to ID Panel     Status: None   Collection Time: 09/11/16 11:55 AM  Result Value Ref Range Status   Specimen Description BLOOD RIGHT HAND  Final   Special Requests BOTTLES DRAWN AEROBIC AND  ANAEROBIC 5CC  Final   Culture NO GROWTH 5 DAYS  Final  Report Status 09/16/2016 FINAL  Final  Urine culture     Status: Abnormal   Collection Time: 09/11/16  3:36 PM  Result Value Ref Range Status   Specimen Description URINE, RANDOM  Final   Special Requests NONE  Final   Culture MULTIPLE SPECIES PRESENT, SUGGEST RECOLLECTION (A)  Final   Report Status 09/12/2016 FINAL  Final  Aerobic/Anaerobic Culture (surgical/deep wound)     Status: None   Collection Time: 09/12/16 12:52 PM  Result Value Ref Range Status   Specimen Description ABSCESS  Final   Special Requests DIVERTICULAR ABSCESS  Final   Gram Stain   Final    MODERATE WBC PRESENT,BOTH PMN AND MONONUCLEAR RARE GRAM NEGATIVE RODS    Culture   Final    RARE KLEBSIELLA PNEUMONIAE MIXED ANAEROBIC FLORA PRESENT.  CALL LAB IF FURTHER IID REQUIRED.    Report Status 09/17/2016 FINAL  Final   Organism ID, Bacteria KLEBSIELLA PNEUMONIAE  Final      Susceptibility   Klebsiella pneumoniae - MIC*    AMPICILLIN 16 RESISTANT Resistant     CEFAZOLIN <=4 SENSITIVE Sensitive     CEFEPIME <=1 SENSITIVE Sensitive     CEFTAZIDIME <=1 SENSITIVE Sensitive     CEFTRIAXONE <=1 SENSITIVE Sensitive     CIPROFLOXACIN <=0.25 SENSITIVE Sensitive     GENTAMICIN <=1 SENSITIVE Sensitive     IMIPENEM <=0.25 SENSITIVE Sensitive     TRIMETH/SULFA <=20 SENSITIVE Sensitive     AMPICILLIN/SULBACTAM <=2 SENSITIVE Sensitive     PIP/TAZO <=4 SENSITIVE Sensitive     Extended ESBL NEGATIVE Sensitive     * RARE KLEBSIELLA PNEUMONIAE  Surgical pcr screen     Status: None   Collection Time: 09/14/16  8:48 AM  Result Value Ref Range Status   MRSA, PCR NEGATIVE NEGATIVE Final   Staphylococcus aureus NEGATIVE NEGATIVE Final  MRSA PCR Screening     Status: None   Collection Time: 09/14/16  9:03 AM  Result Value Ref Range Status   MRSA by PCR NEGATIVE NEGATIVE Final  Culture, blood (Routine X 2) w Reflex to ID Panel     Status: None (Preliminary result)    Collection Time: 09/14/16 10:38 AM  Result Value Ref Range Status   Specimen Description BLOOD RIGHT ARM  Final   Special Requests BOTTLES DRAWN AEROBIC AND ANAEROBIC 5CC EACH  Final   Culture NO GROWTH 4 DAYS  Final   Report Status PENDING  Incomplete  Culture, blood (Routine X 2) w Reflex to ID Panel     Status: None (Preliminary result)   Collection Time: 09/14/16 10:48 AM  Result Value Ref Range Status   Specimen Description BLOOD RIGHT ARM  Final   Special Requests IN PEDIATRIC BOTTLE 3CC  Final   Culture NO GROWTH 4 DAYS  Final   Report Status PENDING  Incomplete     Radiology Studies:  Ir Sinus/fist Tube Chk-non Gi Result Date: 09/19/2016 INDICATION:  Status post drain injection demonstrating no fistula or residual large pelvic collection.    Scheduled Meds: . amoxicillin-clavulanate  1 tablet Oral Q12H  . aspirin  81 mg Oral Daily  . famotidine  20 mg Oral Daily  . feeding supplement  1 Container Oral BID BM  . furosemide  40 mg Intravenous Once  . heparin  5,000 Units Subcutaneous Q8H  . insulin aspart  0-15 Units Subcutaneous TID WC  . insulin aspart  0-5 Units Subcutaneous QHS  . ketorolac  1 drop Left Eye QID  .  lisinopril  2.5 mg Oral BID  . mouth rinse  15 mL Mouth Rinse BID  . metoprolol tartrate  37.5 mg Oral BID  . potassium chloride (KCL MULTIRUN) 30 mEq in 265 mL IVPB  30 mEq Intravenous Once  . potassium chloride  20 mEq Oral Once  . potassium chloride  40 mEq Oral Q6H  . sodium chloride flush  10-40 mL Intracatheter Q12H  . sodium chloride flush  3 mL Intravenous Q12H   Continuous Infusions:   LOS: 11 days   Signature  Lala Lund K M.D on 09/23/2016 at 9:31 AM  Between 7am to 7pm - Pager - [phone], After 7pm go to [email or web address] - password [facility]  [facility]  - Office  [phone]

## 2016-09-23 NOTE — Clinical Social Work Note (Signed)
CSW spoke with pt's daughter. At this time pt's daughter request her mom be sent to [facility] at discharge. CSW is awaiting PT eval. Per MD pt should d/c tomorrow after PT eval. Pt's daughter was inquiring about transporting the pt. CSW will ask MD if that is safe, if not pt will go by [facility].  [address] [place]

## 2016-09-23 NOTE — Progress Notes (Signed)
Speech Language Pathology Treatment: Dysphagia  Patient Details Name: [name] MRN: [id number] DOB: Nov 15, 1945 Today's Date: 09/23/2016 Time: 1484-0397 SLP Time Calculation (min) (ACUTE ONLY): 14 min  Assessment / Plan / Recommendation Clinical Impression  Pt seen for dysphagia followup. Pt refused to drink nectar-thick liquids after swallow assessment yesterday. Fortunately, pt's performance with thin liquids is significantly improved this date. No coughing or change in vocal quality noted even when trialed with straw. Will upgrade diet to Dys 3 with thin and follow for tolerance. Discussed importance of upright positioning and small sips with pt.    HPI HPI: 69 y.o.female with hx diverticulitis, CAD, COPD, DM, HTN, hx NSCLC in 2012, initially presented 11/19 with abd pain. Initial w/u revealed diverticulitis with abscess. She was seen by surgery, started on zosyn and had IR drain placed 11/20. She was improving slowly until 11/22 when she was found to be in SVT 190's, then subsequently had PEA arrest with total 6 mins CPR, Defib x 2 for VFib. Intubated during code, awake post arrest but remained hypotensive. Extubated 11/23 and then found to have severe cardiomyopathy.      SLP Plan  Continue with current plan of care     Recommendations  Diet recommendations: Dysphagia 3 (mechanical soft);Thin liquid Liquids provided via: Cup;Straw Medication Administration: Whole meds with puree Supervision: Intermittent supervision to cue for compensatory strategies Compensations: Small sips/bites;Slow rate;Minimize environmental distractions                Oral Care Recommendations: Oral care QID Plan: Continue with current plan of care       [place] MA, [place] Pager [phone] 09/23/2016, 9:57 AM

## 2016-09-23 NOTE — Progress Notes (Addendum)
Pharmacy Antibiotic Note  [name] is a 70 y.o. female admitted on 09/11/2016 with Kleb pneumo diverticular abscess. Pharmacy has been consulted to transition from Unasyn to Augmentin dosing - ABX D#13 - plan 7 more days per Surgery note.   Last dose of Unasyn at 0822 this AM. Afebrile, WBC normalized, renal function stable, CrCl~50.  Plan: D/c Unsayn Augmentin 875-'125mg'$  PO Q12h - plan 7 more days Monitor clinical progress, c/s, renal function  Height: '5\' 2"'$  (157.5 cm) Weight: 157 lb 6.4 oz (71.4 kg) IBW/kg (Calculated) : 50.1  Temp (24hrs), Avg:98.1 F (36.7 C), Min:97.9 F (36.6 C), Max:98.2 F (36.8 C)   Recent Labs Lab 09/18/16 0508 09/18/16 0509 09/20/16 0815 09/21/16 0331 09/22/16 0532 09/23/16 0213  WBC  --  8.9 12.8* 12.1* 10.4 9.7  CREATININE 0.95  --  0.89 0.87 0.85 0.99    Estimated Creatinine Clearance: 50.3 mL/min (by C-G formula based on SCr of 0.99 mg/dL).    Allergies  Allergen Reactions  . Lactose Intolerance (Gi) Diarrhea  . Ibuprofen     REACTION: Anxiousness, hyperventilates    Elicia Lamp, PharmD, BCPS Clinical Pharmacist 09/23/2016 9:28 AM

## 2016-09-23 NOTE — NC FL2 (Signed)
[place] LEVEL OF CARE SCREENING TOOL     IDENTIFICATION  Patient Name: [name] Birthdate: 1946-05-13 Sex: female Admission Date (Current Location): 09/11/2016  [facility] [place] Number:  Herbalist and Address:  [facility], [place] [address], [place], [place] [address]      Provider Number: [id number]  Attending Physician Name and Address:  Thurnell Lose, MD  Relative Name and Phone Number:  [name] [phone]    Current Level of Care: Hospital Recommended Level of Care: [place] Prior Approval Number:    Date Approved/Denied:   PASRR Number: [id number]  Discharge Plan: SNF    Current Diagnoses: Patient Active Problem List   Diagnosis Date Noted  . Hypokalemia 09/17/2016  . Hypertension associated with chronic kidney disease due to type 2 diabetes mellitus ([place]) 09/17/2016  . Cardiac arrest with ventricular fibrillation ([place]) 09/14/2016  . Takotsubo cardiomyopathy 09/14/2016  . Acute respiratory failure ([place])   . Diverticulitis of large intestine with perforation and abscess 09/11/2016  . Debilitated patient 07/08/2016  . Arterial hypotension   . Controlled type 2 diabetes mellitus with hyperglycemia, with long-term current use of insulin ([place]) 06/14/2016  . Hyperlipidemia 06/14/2016  . Non-occlusive coronary artery disease 06/06/2012  . TOBACCO USE, QUIT 07/29/2009  . DM (diabetes mellitus), type 2, uncontrolled with complications ([place]) 72/62/0355    Orientation RESPIRATION BLADDER Height & Weight     Self, Time, Situation, Place  O2 (2L) Incontinent Weight: 157 lb 6.4 oz (71.4 kg) Height:  '5\' 2"'$  (157.5 cm)  BEHAVIORAL SYMPTOMS/MOOD NEUROLOGICAL BOWEL NUTRITION STATUS      Incontinent Diet (Nectar Thick)  AMBULATORY STATUS COMMUNICATION OF NEEDS Skin   Extensive Assist Verbally Normal                       Personal Care Assistance Level of Assistance  Bathing, Feeding,  Dressing Bathing Assistance: Limited assistance Feeding assistance: Independent Dressing Assistance: Limited assistance     Functional Limitations Info  Sight, Hearing, Speech Sight Info: Adequate Hearing Info: Adequate Speech Info: Adequate    SPECIAL CARE FACTORS FREQUENCY  PT (By licensed PT), OT (By licensed OT)     PT Frequency: 5x week OT Frequency: 5x week            Contractures Contractures Info: Not present    Additional Factors Info  Code Status, Allergies, Insulin Sliding Scale Code Status Info: Full Code Allergies Info: LACTOSE INTOLERANCE (GI), IBUPROFEN   Insulin Sliding Scale Info: insulin aspart (novoLOG) injection 0-9 Units;insulin glargine (LANTUS) injection 5 Units;       Current Medications (09/23/2016):  This is the current hospital active medication list Current Facility-Administered Medications  Medication Dose Route Frequency Provider Last Rate Last Dose  . 0.9 %  sodium chloride infusion  250 mL Intravenous PRN Troy Sine, MD 10 mL/hr at 09/19/16 0800 250 mL at 09/19/16 0800  . acetaminophen (TYLENOL) tablet 650 mg  650 mg Oral Q6H PRN Rondel Jumbo, PA-C   650 mg at 09/12/16 9741   Or  . acetaminophen (TYLENOL) suppository 650 mg  650 mg Rectal Q6H PRN Rondel Jumbo, PA-C      . amoxicillin-clavulanate (AUGMENTIN) 875-125 MG per tablet 1 tablet  1 tablet Oral Q12H Romona Curls, [facility]      . aspirin chewable tablet 81 mg  81 mg Oral Daily Troy Sine, MD   81 mg at 09/23/16  1003  . bisacodyl (DULCOLAX) suppository 10 mg  10 mg Rectal Daily PRN Rondel Jumbo, PA-C      . famotidine (PEPCID) tablet 20 mg  20 mg Oral Daily Theodis Blaze, MD   20 mg at 09/23/16 1003  . feeding supplement (BOOST / RESOURCE BREEZE) liquid 1 Container  1 Container Oral BID BM Theodis Blaze, MD   1 Container at 09/23/16 1000  . fentaNYL (SUBLIMAZE) injection 12.5-50 mcg  12.5-50 mcg Intravenous Q2H PRN Brand Males, MD   12.5 mcg at 09/21/16 2115  . heparin  injection 5,000 Units  5,000 Units Subcutaneous Q8H Ardis Rowan, PA-C   5,000 Units at 09/23/16 9675  . HYDROcodone-acetaminophen (NORCO/VICODIN) 5-325 MG per tablet 1-2 tablet  1-2 tablet Oral Q6H PRN Collene Gobble, MD   2 tablet at 09/23/16 [phone]  . insulin aspart (novoLOG) injection 0-15 Units  0-15 Units Subcutaneous TID WC Collene Gobble, MD   3 Units at 09/23/16 [phone]  . insulin aspart (novoLOG) injection 0-5 Units  0-5 Units Subcutaneous QHS Collene Gobble, MD      . ketorolac (ACULAR) 0.5 % ophthalmic solution 1 drop  1 drop Left Eye QID Thurnell Lose, MD   1 drop at 09/23/16 1003  . levalbuterol (XOPENEX) nebulizer solution 0.63 mg  0.63 mg Nebulization Q3H PRN Marijean Heath, NP   0.63 mg at 09/21/16 2215  . lisinopril (PRINIVIL,ZESTRIL) tablet 2.5 mg  2.5 mg Oral BID Deboraha Sprang, MD   2.5 mg at 09/23/16 1003  . magnesium citrate solution 1 Bottle  1 Bottle Oral Once PRN Rondel Jumbo, PA-C      . MEDLINE mouth rinse  15 mL Mouth Rinse BID Raylene Miyamoto, MD   15 mL at 09/23/16 1000  . metoprolol tartrate (LOPRESSOR) tablet 37.5 mg  37.5 mg Oral BID Peter M Martinique, MD   37.5 mg at 09/23/16 1003  . ondansetron (ZOFRAN) tablet 4 mg  4 mg Oral Q6H PRN Rondel Jumbo, PA-C       Or  . ondansetron [facility]) injection 4 mg  4 mg Intravenous Q6H PRN Rondel Jumbo, PA-C   4 mg at 09/20/16 1237  . potassium chloride 30 mEq in sodium chloride 0.9 % 265 mL (KCL MULTIRUN) IVPB  30 mEq Intravenous Once Thurnell Lose, MD      . potassium chloride SA (K-DUR,KLOR-CON) CR tablet 20 mEq  20 mEq Oral Once Thurnell Lose, MD      . potassium chloride SA (K-DUR,KLOR-CON) CR tablet 40 mEq  40 mEq Oral Q6H Thurnell Lose, MD   40 mEq at 09/23/16 0651  . sodium chloride flush (NS) 0.9 % injection 10-40 mL  10-40 mL Intracatheter PRN Theodis Blaze, MD   10 mL at 09/21/16 0341  . sodium chloride flush (NS) 0.9 % injection 10-40 mL  10-40 mL Intracatheter Q12H Theodis Blaze, MD   20 mL  at 09/21/16 1916  . sodium chloride flush (NS) 0.9 % injection 3 mL  3 mL Intravenous Q12H Rondel Jumbo, PA-C   3 mL at 09/23/16 1002  . zolpidem (AMBIEN) tablet 5 mg  5 mg Oral QHS PRN Jeryl Columbia, NP   5 mg at 09/22/16 0123     Discharge Medications: Please see discharge summary for a list of discharge medications.  Relevant Imaging Results:  Relevant Lab Results:   Additional Information SSN: [id number],  intra-abdominal  drain placed on 11/20   Wende Neighbors, [name]

## 2016-09-23 NOTE — Progress Notes (Signed)
Patient ID: [name], female   DOB: [birth year], 70 y.o.   MRN: [id number]    Referring Physician(s):  Dr. Rolm Bookbinder  Supervising Physician: Corrie Mckusick  Patient Status:  [facility] - In-pt  Chief Complaint:  Recurrent diverticular abscess  Subjective:  Patient complains of pressure in her lower abdomen, but is more upset with having to drink Nectar thick liquids after SLP evaluation yesterday. She is now agreeable to rehab.   Allergies: Lactose intolerance (gi) and Ibuprofen  Medications: Prior to Admission medications   Medication Sig Start Date End Date Taking? Authorizing Provider  acetaminophen (TYLENOL) 500 MG tablet Take 1,000 mg by mouth every 6 (six) hours as needed for headache (pain).   Yes Historical Provider, MD  albuterol (PROAIR HFA) 108 (90 Base) MCG/ACT inhaler Inhale 2 puffs into the lungs every 6 (six) hours as needed for wheezing or shortness of breath.   Yes Historical Provider, MD  albuterol (PROVENTIL) (2.5 MG/3ML) 0.083% nebulizer solution USE ONE VIAL IN NEBULIZER EVERY 4 HOURS AS NEEDED FOR WHEEZING OR SHORTNESS OF BREATH 12/16/15  Yes [name], MD  aspirin EC 81 MG tablet Take 81 mg by mouth at bedtime.   Yes Historical Provider, MD  aspirin-acetaminophen-caffeine (EXCEDRIN MIGRAINE) [phone] MG per tablet Take 1 tablet by mouth every 6 (six) hours as needed for headache.   Yes Historical Provider, MD  atorvastatin (LIPITOR) 80 MG tablet TAKE ONE TABLET BY MOUTH ONCE DAILY Patient taking differently: TAKE ONE TABLET BY MOUTH ONCE DAILY AT BEDTIME 07/18/16  Yes [name], MD  Bromfenac Sodium (PROLENSA) 0.07 % SOLN Place 1 drop into the left eye at bedtime.   Yes Historical Provider, MD  doxycycline (VIBRA-TABS) 100 MG tablet Take 1 tablet (100 mg total) by mouth 2 (two) times daily. Patient taking differently: Take 100 mg by mouth 2 (two) times daily. 10 day course started 09/07/16 09/07/16  Yes [name], MD  fluticasone  (FLONASE) 50 MCG/ACT nasal spray Place 2 sprays into both nostrils at bedtime. 06/25/14  Yes [name], MD  Insulin Isophane & Regular Human (HUMULIN 70/30 KWIKPEN) (70-30) 100 UNIT/ML PEN Inject 16 units subcutaneously before breakfast and 20 units before supper Patient taking differently: Inject 20-25 Units into the skin See admin instructions. Inject 20 units subcutaneously before breakfast and 25 units before supper 06/20/16  Yes Eugenie Filler, MD  loratadine (CLARITIN) 10 MG tablet Take 10 mg by mouth at bedtime.    Yes Historical Provider, MD  metFORMIN (GLUCOPHAGE) 500 MG tablet TAKE ONE TABLET BY MOUTH TWICE DAILY WITH MEALS 01/21/16  Yes [name], MD  nitroGLYCERIN (NITROSTAT) 0.4 MG SL tablet Place 1 tablet (0.4 mg total) under the tongue every 5 (five) minutes as needed. For chest pain Patient taking differently: Place 0.4 mg under the tongue every 5 (five) minutes as needed for chest pain.  12/31/12  Yes Lisabeth Pick, MD  pantoprazole (PROTONIX) 40 MG tablet TAKE ONE TABLET BY MOUTH ONCE DAILY Patient taking differently: TAKE ONE TABLET BY MOUTH ONCE DAILY AT BEDTIME 10/15/15  Yes [name], MD  prednisoLONE acetate (PRED FORTE) 1 % ophthalmic suspension Place 1 drop into the left eye at bedtime.  01/18/16  Yes Historical Provider, MD  predniSONE (DELTASONE) 20 MG tablet 2 tabs po qd x 5d Patient taking differently: Take 40 mg by mouth daily with breakfast. 5 day course started 09/07/16 09/07/16  Yes [name], MD  FLOVENT HFA 220 MCG/ACT  inhaler INHALE TWO PUFFS BY MOUTH TWICE DAILY Patient not taking: Reported on 09/11/2016 01/12/16   [name], MD     Vital Signs: BP (!) 130/52 (BP Location: Right Arm)   Pulse 80   Temp 97.9 F (36.6 C) (Oral)   Resp 18   Ht '5\' 2"'$  (1.575 m)   Wt 157 lb 6.4 oz (71.4 kg)   SpO2 97%   BMI 28.79 kg/m   Physical Exam  Constitutional: She is oriented to person, place, and time. She appears well-developed. No  distress.  Pulmonary/Chest: Effort normal. No respiratory distress (occasional cough).  Abdominal: There is tenderness (diffusely tender.).  Drain remains in place.  Small slot aspirated.  Drain flushed.  Site c/d/i  Neurological: She is alert and oriented to person, place, and time.  Nursing note and vitals reviewed.   Imaging: Dg Chest 2 View  Result Date: 09/21/2016 CLINICAL DATA:  Cough and shortness of Breath EXAM: CHEST  2 VIEW COMPARISON:  09/14/2016 FINDINGS: Cardiac shadow is stable. Endotracheal tube and nasogastric catheter is well as a left jugular catheter have been removed. Right jugular catheter remains. Bilateral small pleural effusions are noted. Likely some underlying atelectasis is present as well. There is persistent right peritracheal density consistent with consolidation similar to that seen on the prior exam. No acute bony abnormality is seen. IMPRESSION: Persistent consolidation in the right upper lobe. Bilateral pleural effusions. Electronically Signed   By: Inez Catalina M.D.   On: 09/21/2016 20:15   Ct Chest Wo Contrast  Result Date: 09/22/2016 CLINICAL DATA:  Continued shortness of breath and coughing with chest pain for several days. History of lung cancer EXAM: CT CHEST WITHOUT CONTRAST TECHNIQUE: Multidetector CT imaging of the chest was performed following the standard protocol without IV contrast. COMPARISON:  09/14/2016 FINDINGS: Cardiovascular: Slight increase in the small pericardial effusion. Coronary artery calcification is noted. Atherosclerotic calcification is noted in the wall of the thoracic aorta. Right-sided central line tip is positioned in the distal SVC. Mediastinum/Nodes: Persistent abnormal soft tissue attenuation identified in the right hilum and right suprahilar region. The esophagus has normal imaging features. There is no axillary lymphadenopathy. Lungs/Pleura: Airspace consolidation in the medial right upper lung and apex is not substantially  changed interval, presumably representing post radiation fibrosis. The asymmetric, right greater than left ground-glass attenuation seen previously has improved in the interval as has the interlobular septal thickening. Small to moderate right pleural effusion and small left pleural effusion are slightly progressed in the interval. Upper Abdomen: Unremarkable. Musculoskeletal: Bone windows reveal no worrisome lytic or sclerotic osseous lesions. Apparent nondisplaced upper sternal fracture unchanged. IMPRESSION: 1. Post treatment changes medial right upper lobe involving the right hilum and apex are not substantially changed in the interval. 2. Scattered ground-glass attenuation with interlobular septal thickening seen previously has decreased in the interval. 3. Slight progression of bilateral pleural effusions. Electronically Signed   By: Misty Stanley M.D.   On: 09/22/2016 11:32   Ct Abdomen Pelvis W Contrast  Result Date: 09/20/2016 CLINICAL DATA:  Diverticulitis. EXAM: CT ABDOMEN AND PELVIS WITH CONTRAST TECHNIQUE: Multidetector CT imaging of the abdomen and pelvis was performed using the standard protocol following bolus administration of intravenous contrast. CONTRAST:  167m ISOVUE-300 IOPAMIDOL (ISOVUE-300) INJECTION 61% COMPARISON:  CT scan of September 14, 2016. FINDINGS: Lower chest: Mild bilateral pleural effusions are noted with adjacent subsegmental atelectasis. Hepatobiliary: Status post cholecystectomy. Stable intrahepatic biliary dilatation is noted which most likely is related to post cholecystectomy status.  No focal abnormality is seen in the liver. Pancreas: Unremarkable. No pancreatic ductal dilatation or surrounding inflammatory changes. Spleen: Normal in size without focal abnormality. Adrenals/Urinary Tract: Adrenal glands are unremarkable. Kidneys are normal, without renal calculi, focal lesion, or hydronephrosis. Bladder is unremarkable. Stomach/Bowel: Drainage catheter noted in para  diverticular abscess on prior exam has been removed. 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 Mild inflammatory changes and fluid remain within the pelvis. There is no evidence of bowel obstruction. The appendix appears normal. Vascular/Lymphatic: Aortic atherosclerosis. No enlarged abdominal or pelvic lymph nodes. Reproductive: Status post hysterectomy. No adnexal masses. Other: None. Musculoskeletal: Mild degenerative disc disease is noted at L5-S1. IMPRESSION: Drainage catheter noted in paradiverticular abscess on prior exam has been removed. 3.9 x 2.1 cm persistent fluid collection with air-fluid level remains, concerning for abscess and possible fistulous connection to the sigmoid colon. Mild inflammatory changes in fluid remain within the pelvis. Aortic atherosclerosis. Mild bilateral pleural effusions with adjacent subsegmental atelectasis. Electronically Signed   By: Marijo Conception, M.D.   On: 09/20/2016 15:45   Dg Chest Port 1 View  Result Date: 09/22/2016 CLINICAL DATA:  Shortness of breath. EXAM: PORTABLE CHEST 1 VIEW COMPARISON:  09/21/2016 .  CT 09/14/2016. FINDINGS: Right IJ line in stable position. Persistent right upper lobe mass/ infiltrate and probable post treatment changes noted. Low lung volumes with mild basilar atelectasis. No prominent pleural effusion. No pneumothorax. Heart size normal. No acute bony abnormality . IMPRESSION: 1.  Right IJ line stable position. 2. Persistent right upper lobe mass/infiltrate and probable post treatment changes. No significant change. Low lung volumes. Chest is stable from prior exam . 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 EXAM: CT IMAGE  GUIDED FLUID DRAIN BY CATHETER MEDICATIONS: The patient is currently admitted to the hospital and receiving intravenous antibiotics. The antibiotics were administered within an appropriate time frame prior to the initiation of the procedure. ANESTHESIA/SEDATION: Fentanyl 100 mcg IV; Versed 2 mg IV Moderate Sedation Time:  15 minutes The patient was continuously monitored during the procedure by the interventional radiology nurse under my direct supervision. COMPLICATIONS: None immediate. PROCEDURE: Informed written consent was obtained from the patient after a thorough discussion of the procedural risks, benefits and alternatives. All questions were addressed. Maximal Sterile Barrier Technique was utilized including caps, mask, sterile gowns, sterile gloves, sterile drape, hand hygiene and skin antiseptic. A timeout was performed prior to the initiation of the procedure. A planning axial CT scan was performed. The fluid and gas collection adjacent to the sigmoid colon was successfully identified. A suitable skin entry site was selected and marked. The region was sterilely prepped and draped in standard fashion using chlorhexidine skin prep. Local anesthesia was attained by infiltration with 1% lidocaine. Under intermittent CT guidance, a 20 cm 18 gauge trocar needle was advanced into the fluid and gas collection. Initial aspiration yields frankly purulent material. Therefore, the decision was made to proceed with drain placement. An Amplatz wire was advanced through the needle and coiled within the fluid and gas collection. The needle was removed and the skin tract was dilated to 10 [place]. A Cook 10.2 [place] all-purpose drainage catheter was then advanced over the wire and formed in the  fluid collection. Aspiration yields approximately 10 mL thick purulent material. A sample was sent for culture. The catheter was flushed and connected to JP bulb suction. The catheter was then secured to the skin with 0 Prolene  suture and an adhesive fixation device. IMPRESSION: Successful CT-guided drain placement into recurrent diverticular abscess. Electronically Signed   By: Jacqulynn Cadet M.D.   On: 09/21/2016 16:35    Labs:  CBC:  Recent Labs  09/20/16 0815 09/21/16 0331 09/22/16 0532 09/23/16 0213  WBC 12.8* 12.1* 10.4 9.7  HGB 9.9* 9.6* 9.6* 9.6*  HCT 30.5* 29.2* 29.2* 29.6*  PLT 161 189 211 243    COAGS:  Recent Labs  06/14/16 2032 09/12/16 0422 09/15/16 0450 09/21/16 0815  INR 1.20 1.34 1.29 1.12  APTT 34  --  26  --     BMP:  Recent Labs  09/20/16 0815 09/21/16 0331 09/22/16 0532 09/23/16 0213  NA 140 140 139 137  K 3.8 3.2* 3.8 3.0*  CL 103 102 103 94*  CO2 '31 29 25 '$ 32  GLUCOSE 123* 137* 102* 213*  BUN '11 8 8 10  '$ CALCIUM 8.4* 8.5* 8.5* 8.5*  CREATININE 0.89 0.87 0.85 0.99  GFRNONAA >60 >60 >60 57*  GFRAA >60 >60 >60 >60    LIVER FUNCTION TESTS:  Recent Labs  12/29/15 2327 06/14/16 1158 09/11/16 1030 09/12/16 0422 09/16/16 0434  BILITOT 0.3 0.9 0.5 0.7  --   AST '24 22 17 '$ 14*  --   ALT '31 19 16 '$ 13*  --   ALKPHOS 92 105 86 67  --   PROT 6.8 7.8 6.9 5.2*  --   ALBUMIN 3.6 4.0 3.5 2.6* 2.3*    Assessment and Plan: Diverticular abscess with recurrent fluid collection s/p drain replacement 11/29 by Dr. Kathlene Cote. Drain remains in place with blood tinged output.  NO output recorded- will replace order for I/O's. Per surgery- will likely need repeat scan possibly early next week.  Continue with routine drain care and BID flushes.  IR to follow.   Electronically Signed: Docia Barrier 09/23/2016, 9:13 AM   I spent a total of 15 Minutes at the the patient's bedside AND on the patient's hospital floor or unit, greater than 50% of which was counseling/coordinating care for diverticular abscess.

## 2016-09-23 NOTE — Care Management Important Message (Signed)
Important Message  Patient Details  Name: [name] MRN: [id number] Date of Birth: April 03, 1946   Medicare Important Message Given:  Yes    Nathen May 09/23/2016, 12:23 PM

## 2016-09-23 NOTE — Clinical Social Work Note (Signed)
Clinical Social Work Assessment  Patient Details  Name: [name] MRN: [id number] Date of Birth: September 11, 1946  Date of referral:  09/23/16               Reason for consult:  Discharge Planning                Permission sought to share information with:  Family Supports Permission granted to share information::  Yes, Verbal Permission Granted  Name::     Nolberto Hanlon  Agency::     Relationship::  Daughter  Contact Information:  [phone]  Housing/Transportation Living arrangements for the past 2 months:  Single Family Home Source of Information:  Patient Patient Interpreter Needed:  None Criminal Activity/Legal Involvement Pertinent to Current Situation/Hospitalization:  No - Comment as needed Significant Relationships:  Adult Children, Spouse, Siblings, Other Family Members Lives with:  Adult Children, Spouse Do you feel safe going back to the place where you live?  Yes Need for family participation in patient care:  Yes (Comment)  Care giving concerns:  No family/friends in the home. Patient states that she lives in the home with husband, adult daughter and grandchild. Patient states she has a big family and they are supportive   Facilities manager / plan: Holiday representative met patient at bedside to offer support and discuss needs at discharge. Patient states that she lives at home with her husband, daughter and grandson. Patient is agreeable to SNF placement after having a conversation with her husband, patient would prefer staying in the [place] area to be close to family. CSW to complete neccessary paperwork and initiate SNF search on patient behalf. CSW to follow up with patient once bed offers are available. CSW remains available for support and to facilitate patient discharge needs once medically ready.   Employment status:  Retired Forensic scientist:  Medicare PT Recommendations:  [place] / Referral to community  resources:  Felt  Patient/Family's Response to care:  Patient verbalized appreciation and understanding for CSW role and involvement in care. Patient agreeable with current discharge plan to SNF following discharge.  Patient/Family's Understanding of and Emotional Response to Diagnosis, Current Treatment, and Prognosis:  Patient with good understanding of current medical state and limitations around most recent hospitalization. Patient is agreeable with SNF placement in hopes of transitioning back home to family.   Emotional Assessment Appearance:  Appears stated age Attitude/Demeanor/Rapport:  Other (attitude/demeanor appropriate ) Affect (typically observed):  Happy, Pleasant Orientation:  Oriented to Self, Oriented to Place, Oriented to Situation, Oriented to  Time Alcohol / Substance use:  Not Applicable Psych involvement (Current and /or in the community):  No (Comment)  Discharge Needs  Concerns to be addressed:  Discharge Planning Concerns Readmission within the last 30 days:  No Current discharge risk:  None Barriers to Discharge:  Barriers Resolved   Rhea Pink, [name],  [place]

## 2016-09-23 NOTE — Progress Notes (Signed)
Inpatient Diabetes Program Recommendations  AACE/ADA: New Consensus Statement on Inpatient Glycemic Control (2015)  Target Ranges:  Prepandial:   less than 140 mg/dL      Peak postprandial:   less than 180 mg/dL (1-2 hours)      Critically ill patients:  140 - 180 mg/dL   Lab Results  Component Value Date   GLUCAP 297 (H) 09/23/2016   HGBA1C 7.1 (H) 09/11/2016    Review of Glycemic Control:  Results for Rachael, [name] (MRN [id number]) as of 09/23/2016 11:10  Ref. Range 09/22/2016 11:22 09/22/2016 16:40 09/22/2016 22:00 09/23/2016 06:37 09/23/2016 11:03  Glucose-Capillary Latest Ref Range: 65 - 99 mg/dL 162 (H) 273 (H) 199 (H) 155 (H) 297 (H)   Diabetes history: Type 2 diabetes Outpatient Diabetes medications: 70/30 20 units with breakfast and 25 units with supper, Metformin 500 mg bid Current orders for Inpatient glycemic control:  Novolog moderate tid with meals and HS  Inpatient Diabetes Program Recommendations:   May consider restarting a portion of patient's home dose of 70/30.  Consider Novolog 70/30-12 units bid with meals.   Thanks, Adah Perl, RN, BC-ADM Inpatient Diabetes Coordinator Pager [phone] (8a-5p)

## 2016-09-23 NOTE — Progress Notes (Signed)
Patient Name: [name] Date of Encounter: 09/23/2016  Primary Cardiologist: Dr. Beckie Salts Problem List     Principal Problem:   Cardiac arrest with ventricular fibrillation [facility]) Active Problems:   DM (diabetes mellitus), type 2, uncontrolled with complications ([place])   Non-occlusive coronary artery disease   Diverticulitis of large intestine with perforation and abscess   Takotsubo cardiomyopathy   Acute respiratory failure (HCC)   Hypokalemia   Hypertension associated with chronic kidney disease due to type 2 diabetes mellitus ([place])     Subjective   Feels well today, up and in her chair. She says her appetite is better. No SOB.   Inpatient Medications    Scheduled Meds: . amoxicillin-clavulanate  1 tablet Oral Q12H  . aspirin  81 mg Oral Daily  . famotidine  20 mg Oral Daily  . feeding supplement  1 Container Oral BID BM  . heparin  5,000 Units Subcutaneous Q8H  . insulin aspart  0-15 Units Subcutaneous TID WC  . insulin aspart  0-5 Units Subcutaneous QHS  . ketorolac  1 drop Left Eye QID  . lisinopril  2.5 mg Oral BID  . mouth rinse  15 mL Mouth Rinse BID  . metoprolol tartrate  37.5 mg Oral BID  . potassium chloride (KCL MULTIRUN) 30 mEq in 265 mL IVPB  30 mEq Intravenous Once  . potassium chloride  20 mEq Oral Once  . potassium chloride  40 mEq Oral Q6H  . sodium chloride flush  10-40 mL Intracatheter Q12H  . sodium chloride flush  3 mL Intravenous Q12H   Continuous Infusions:  PRN Meds: sodium chloride, acetaminophen **OR** acetaminophen, bisacodyl, fentaNYL (SUBLIMAZE) injection, HYDROcodone-acetaminophen, levalbuterol, magnesium citrate, ondansetron **OR** ondansetron (ZOFRAN) IV, sodium chloride flush, zolpidem   Vital Signs    Vitals:   09/22/16 2036 09/23/16 0449 09/23/16 1003 09/23/16 1241  BP: (!) 132/93 (!) 130/52 (!) 115/44 113/66  Pulse: (!) 111 80 (!) 114 93  Resp: '20 18  19  '$ Temp: 98.2 F (36.8 C) 97.9 F (36.6 C)  97.7 F  (36.5 C)  TempSrc: Oral Oral  Oral  SpO2: 93% 97%  99%  Weight:  157 lb 6.4 oz (71.4 kg)    Height:        Intake/Output Summary (Last 24 hours) at 09/23/16 1303 Last data filed at 09/23/16 1241  Gross per 24 hour  Intake              603 ml  Output              810 ml  Net             -207 ml   Filed Weights   09/21/16 0558 09/22/16 0643 09/23/16 0449  Weight: 165 lb 8 oz (75.1 kg) 164 lb (74.4 kg) 157 lb 6.4 oz (71.4 kg)    Physical Exam   GEN: Well nourished, well developed, in no acute distress.  HEENT: Grossly normal.  Neck: Supple, no JVD, carotid bruits, or masses. Cardiac: RRR, no murmurs, rubs, or gallops. No clubbing, cyanosis, edema.  Radials/DP/PT 2+ and equal bilaterally.  Respiratory:  Respirations regular and unlabored, rhonchi in all lobes  GI: Soft, nontender, nondistended, BS + x 4. MS: no deformity or atrophy. Skin: warm and dry, no rash. Neuro:  Strength and sensation are intact. Psych: AAOx3.  Normal affect.  Labs    CBC  Recent Labs  09/22/16 0532 09/23/16 0213  WBC 10.4 9.7  HGB  9.6* 9.6*  HCT 29.2* 29.6*  MCV 92.4 90.8  PLT 211 884   Basic Metabolic Panel  Recent Labs  09/22/16 0532 09/23/16 0213  NA 139 137  K 3.8 3.0*  CL 103 94*  CO2 25 32  GLUCOSE 102* 213*  BUN 8 10  CREATININE 0.85 0.99  CALCIUM 8.5* 8.5*  MG 2.2  --     Telemetry     NSR/Sinus Tach- Personally Reviewed  ECG    NSR, wide QRS, inverted T waves diffusely - Personally Reviewed  Radiology    Dg Chest 2 View  Result Date: 09/21/2016 CLINICAL DATA:  Cough and shortness of Breath EXAM: CHEST  2 VIEW COMPARISON:  09/14/2016 FINDINGS: Cardiac shadow is stable. Endotracheal tube and nasogastric catheter is well as a left jugular catheter have been removed. Right jugular catheter remains. Bilateral small pleural effusions are noted. Likely some underlying atelectasis is present as well. There is persistent right peritracheal density consistent with  consolidation similar to that seen on the prior exam. No acute bony abnormality is seen. IMPRESSION: Persistent consolidation in the right upper lobe. Bilateral pleural effusions. Electronically Signed   By: Inez Catalina M.D.   On: 09/21/2016 20:15   Ct Chest Wo Contrast  Result Date: 09/22/2016 CLINICAL DATA:  Continued shortness of breath and coughing with chest pain for several days. History of lung cancer EXAM: CT CHEST WITHOUT CONTRAST TECHNIQUE: Multidetector CT imaging of the chest was performed following the standard protocol without IV contrast. COMPARISON:  09/14/2016 FINDINGS: Cardiovascular: Slight increase in the small pericardial effusion. Coronary artery calcification is noted. Atherosclerotic calcification is noted in the wall of the thoracic aorta. Right-sided central line tip is positioned in the distal SVC. Mediastinum/Nodes: Persistent abnormal soft tissue attenuation identified in the right hilum and right suprahilar region. The esophagus has normal imaging features. There is no axillary lymphadenopathy. Lungs/Pleura: Airspace consolidation in the medial right upper lung and apex is not substantially changed interval, presumably representing post radiation fibrosis. The asymmetric, right greater than left ground-glass attenuation seen previously has improved in the interval as has the interlobular septal thickening. Small to moderate right pleural effusion and small left pleural effusion are slightly progressed in the interval. Upper Abdomen: Unremarkable. Musculoskeletal: Bone windows reveal no worrisome lytic or sclerotic osseous lesions. Apparent nondisplaced upper sternal fracture unchanged. IMPRESSION: 1. Post treatment changes medial right upper lobe involving the right hilum and apex are not substantially changed in the interval. 2. Scattered ground-glass attenuation with interlobular septal thickening seen previously has decreased in the interval. 3. Slight progression of bilateral  pleural effusions. Electronically Signed   By: Misty Stanley M.D.   On: 09/22/2016 11:32   Dg Chest Port 1 View  Result Date: 09/22/2016 CLINICAL DATA:  Shortness of breath. EXAM: PORTABLE CHEST 1 VIEW COMPARISON:  09/21/2016 .  CT 09/14/2016. FINDINGS: Right IJ line in stable position. Persistent right upper lobe mass/ infiltrate and probable post treatment changes noted. Low lung volumes with mild basilar atelectasis. No prominent pleural effusion. No pneumothorax. Heart size normal. No acute bony abnormality . IMPRESSION: 1.  Right IJ line stable position. 2. Persistent right upper lobe mass/infiltrate and probable post treatment changes. No significant change. Low lung volumes. Chest is stable from prior exam . 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 EXAM: CT IMAGE GUIDED FLUID DRAIN  BY CATHETER MEDICATIONS: The patient is currently admitted to the hospital and receiving intravenous antibiotics. The antibiotics were administered within an appropriate time frame prior to the initiation of the procedure. ANESTHESIA/SEDATION: Fentanyl 100 mcg IV; Versed 2 mg IV Moderate Sedation Time:  15 minutes The patient was continuously monitored during the procedure by the interventional radiology nurse under my direct supervision. COMPLICATIONS: None immediate. PROCEDURE: Informed written consent was obtained from the patient after a thorough discussion of the procedural risks, benefits and alternatives. All questions were addressed. Maximal Sterile Barrier Technique was utilized including caps, mask, sterile gowns, sterile gloves, sterile drape, hand hygiene and skin antiseptic. A timeout was performed prior to the initiation of the procedure. A planning axial CT scan was performed. The fluid and gas collection adjacent to the sigmoid colon was  successfully identified. A suitable skin entry site was selected and marked. The region was sterilely prepped and draped in standard fashion using chlorhexidine skin prep. Local anesthesia was attained by infiltration with 1% lidocaine. Under intermittent CT guidance, a 20 cm 18 gauge trocar needle was advanced into the fluid and gas collection. Initial aspiration yields frankly purulent material. Therefore, the decision was made to proceed with drain placement. An Amplatz wire was advanced through the needle and coiled within the fluid and gas collection. The needle was removed and the skin tract was dilated to 10 [place]. A Cook 10.2 [place] all-purpose drainage catheter was then advanced over the wire and formed in the fluid collection. Aspiration yields approximately 10 mL thick purulent material. A sample was sent for culture. The catheter was flushed and connected to JP bulb suction. The catheter was then secured to the skin with 0 Prolene suture and an adhesive fixation device. IMPRESSION: Successful CT-guided drain placement into recurrent diverticular abscess. Electronically Signed   By: Jacqulynn Cadet M.D.   On: 09/21/2016 16:35    Cardiac Studies  Left Heart Cath and Coronary Angiography  There is severe left ventricular systolic dysfunction.  LV end diastolic pressure is moderately elevated.  There is trivial (1+) mitral regurgitation.  Ost Cx to Prox Cx lesion, 10 %stenosed.  1st Diag lesion, 25 %stenosed.  Mild nonobstructive CAD with 85% smooth narrowing in the first diagonal branch of the LAD; 10% smooth narrowing of the ostial proximal left circumflex coronary artery; and a normal dominant RCA.  Severe LV dysfunction with more pronounced mid, distal anterolateral, apical and distal inferior hypocontractility suggestive of a Takotsubo cardiomyopathy pattern with an ejection fraction of 20-25%.  RECOMMENDATION: Medical therapy. With the patient's episode of torsade du  p[ointes avoid medications with potential for QTC prolongation.     Patient Profile     70 year old female with a past medical history significant for nonobstructive CAD (cath 2006 and again in 2013 with normal LV function, mild, non-obstructive CAD noted), HTN, DM 2, HLD, COPD, former smoker,Hx of non-small cell lung cater s/p chemo and radiation, hx of diverticulitis who initially presented on 11/19 with LLQ pain and found to have diverticulitis with abscess formation. Drain placed. 11/22 am when into cardiac arrest - [place] - VF-VT. Intubated and transferred to the ICU.   Review of tele shows that she initially went into polymorphic VT without long short coupling Had ROSC in 6-8 minutes. Curiously Mag was 2.1 following the code.  Initial trop 0.12. EKG with no acute ST changes. Nonspecific IVCD new since last tracing. ECHO with EF 30-35% and mid-distal anteroseptal akinesis. No RV strain noted. Previously  no know history of LV dysfunction.   CT scan of the abdomen done yesterday showed improving clinical findings. 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  Assessment & Plan    1. Cardiac arrest with polymorphic VT: Seen by EP, there is borderline QT prolongation but no long short coupling. No further VT. ? Related to stress induced cardiomyopathy.   She underwent left heart cath that showed now obstructive CAD, takotsubo cardiomyopathy. EF is 25%. Plan beta blocker therapy, metoprolol increased yesterday to 37.'5mg'$  BID.   2. Diverticular abscess: Per primary team, drain removed. CT shows reaccumulation with abscess. Drain in place now.   3. Severe LV dysfunction: EF 25%. Most likely stress induced cardiomyopathy. Takotsubo. Started on ACE-I, also on metoprolol. Will slowly titrate as BP allows.  Repeat Echo 6-8 weeks.  4. HTN: Well controlled on current regimen.    Signed, Arbutus Leas, NP    09/23/2016, 1:03 PM  Patient seen and examined and history reviewed. Agree with above findings and plan. Patient is stable from a cardiac standpoint on metoprolol and lisinopril. No active cardiac issues at this time. May need further titration of meds as outpatient. Follow up Echo in 6-8 weeks. Needs follow up with Dr Ellyn Hack post discharge. Will sign off now. Please call if needed.  Rhilee Currin [place], [place] 09/23/2016 3:37 PM

## 2016-09-24 LAB — GLUCOSE, CAPILLARY
GLUCOSE-CAPILLARY: 164 mg/dL — AB (ref 65–99)
GLUCOSE-CAPILLARY: 210 mg/dL — AB (ref 65–99)
Glucose-Capillary: 180 mg/dL — ABNORMAL HIGH (ref 65–99)
Glucose-Capillary: 160 mg/dL — ABNORMAL HIGH (ref 65–99)

## 2016-09-24 LAB — POTASSIUM: POTASSIUM: 4.1 mmol/L (ref 3.5–5.1)

## 2016-09-24 MED ORDER — METOPROLOL TARTRATE 37.5 MG PO TABS: 37.5000 mg | ORAL_TABLET | Freq: Two times a day (BID) | ORAL | Status: DC

## 2016-09-24 MED ORDER — INSULIN ASPART 100 UNIT/ML ~~LOC~~ SOLN: SUBCUTANEOUS | 12 refills | Status: DC

## 2016-09-24 MED ORDER — BOOST / RESOURCE BREEZE PO LIQD: 1.0000 | Freq: Two times a day (BID) | ORAL | 0 refills | Status: DC

## 2016-09-24 MED ORDER — LISINOPRIL 2.5 MG PO TABS: 2.5000 mg | ORAL_TABLET | Freq: Two times a day (BID) | ORAL | Status: DC

## 2016-09-24 MED ORDER — AMOXICILLIN-POT CLAVULANATE 875-125 MG PO TABS: 1.0000 | ORAL_TABLET | Freq: Two times a day (BID) | ORAL | Status: DC

## 2016-09-24 MED ORDER — GUAIFENESIN-DM 100-10 MG/5ML PO SYRP
15.0000 mL | ORAL_SOLUTION | ORAL | Status: DC | PRN
  Administered 2016-09-24 – 2016-09-27 (×5): 15 mL via ORAL
  Filled 2016-09-24 (×5): qty 15

## 2016-09-24 NOTE — Progress Notes (Signed)
09/24/2016 1000 Pt ambulated 28f with rolling walker on RA.  Pt's oxygen saturation was 94-96%.  Pt tolerated walk well. A[place]

## 2016-09-24 NOTE — Discharge Summary (Addendum)
[name] BOF:[id number] DOB: 05/03/1946 DOA: 09/11/2016  PCP: [name], MD  Admit date: 09/11/2016  Discharge date: 09/27/2016  Admitted From: Home   Disposition:  SNF   Recommendations for Outpatient Follow-up:   Follow up with PCP in 1-2 weeks  PCP Please obtain BMP/CBC, 2 view CXR in 1week,  (see Discharge instructions)   PCP Please follow up on the following pending results: Must follow with drain clinic and Gen. surgery within a week prior to stopping her antibiotics   Home Health: None  Equipment/Devices: None  Consultations: CCS, IR, PCCM, EP Discharge Condition: Stable   CODE STATUS: Full  Diet Recommendation: DIET SOFT Fluid consistency: Nectar Thick with feeding assistance and aspiration precautions.   Chief Complaint  Patient presents with  . Pelvic Pain     Brief history of present illness from the day of admission and additional interim summary    70 y.o.female with hx diverticulitis, CAD, COPD, DM, HTN, hx NSCLC in 2012, initially presented 11/19 with abd pain. Initial w/u revealed diverticulitis with abscess. She was seen by surgery, started on zosyn and had IR drain placed 11/20. She was improving slowly until 11/22 when she was found to be in SVT 190's, then subsequently had PEA arrest with total 6 mins CPR, Defib x 2 for VFib. Intubated during code, awake post arrest but remained hypotensive.   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  Hospital issues addressed     Diverticular abscess -  Had intra-abdominal drain placed by IR on 09/12/2016 and removed on 09/19/2016, she is on IV antibiotics starting date 09/11/2016, was transitioned to Unasyn on  09/21/2016 and to Augmentin on 09/23/2016, repeat imaging shows reaccumulation of abscess and she had another drain placement on 09/21/2016. Surgery and IR following , she is clinically better tolerating soft diet, now agreeable to SNF and will be discharged to SNF once bed is arranged. We will discharge her on oral Augmentin For 7 more days with outpatient surgery and IR follow-up. Outpatient colonoscopy by Dr. Ardis Hughs.   Cardiac arrest with polymorphic VT EF 25%. With mild acute on chronic systolic CHF. - total 6 mins CPR, Defib x 2 for VFib, intubated during code.  Cards saw the patient, she underwent left heart catheterization on 09/14/2016 with clean coronaries, presentation suspicious for Takotsubo cardiac myopathy. Cards and EP recommend continuing beta blocker and Ace. She developed some shortness of breath and rales on 09/22/2016 and a trial of IV Lasix will be given, cardiology following, We will continue to monitor electrolytes, repeat echo in 6-8 weeks with outpatient cardiology follow-up.   Morning of 09/25/2016 and left-sided tingling numbness lasting a few minutes. She was examined within 10 minutes by me. No focal deficits, no pronator drift, no facial droop, no dysarthria. Her symptoms had resolved within 10-15 minutes. She has no focal deficits or speech/swallowing problems. No new PT-OT-speech evaluation needed. Symptoms had resolved, Head CT unremarkable.  Recent echo noted, MRI did show a left-sided small ischemic stroke which was acute but likely incidental. Have ordered carotid ultrasound, Neuro consulted. Seen by neuro case discussed with Dr. Erlinda Hong today, stable to be discharged to SNF with Plavix and statin. No further neuro workup per Dr. Erlinda Hong.  Acute respiratory failure ([place]) - s/p polymorphic V Tach arrest, extubated 11/23, improved, no oxygen requirement now.  Mild acute on chronic systolic heart failure EF 25% with shortness of breath on 09/21/2016. Diagnosed clinically, much  better after couple of doses of Lasix. Now compensated off oxygen and not short of breath. Monitor fluid status and BMP, placed her on low-dose Lasix along with potassium supplementation request SNF M.D. to monitor BMP along with diuretic and potassium dose closely.  Hypokalemia - supplemented and stable  Acute kidney injury  - resolved    Hypertension, essential  - On lisinopril and metoprolol and BP reasonably controlled     COPD - stable, nebs PRN  Chronic right-sided lung opacity. On CT scan consistent with previous radiation-related changes, no productive cough or fevers, will monitor. Repeat 2 view chest x-ray in 7-10 days by PCP next visit.  DM (diabetes mellitus), type 2, uncontrolled with long term use insulin (HCC) - Stable on SSI will be discharged on it, monitor CBGs every before meals at bedtime are just does as needed.    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 Questions please call:   Natividad Brood, RN BSN [name] Liaison  [phone] business mobile phone Toll free office  [phone]   AMB Referral to [facility]    Complete by:  As directed    Please assign to [place] Worker for follow up at [facility]. Likely to go to [facility] soon. Previously assigned to  [place]. However, discharge plan likely SNF now. Will update if facility changes. Currently at [facility]. Thanks. Marthenia Rolling, MSN-Ed, [facility] [phone]   Reason for consult:  Please assign to [facility] Social Worker.   Expected date of contact:  1-3 days (reserved for hospital discharges)   Discharge instructions    Complete by:  As directed    Follow with Primary MD MCGOWEN,PHILIP H, MD in 7 days   Get CBC, CMP, 2 view Chest X ray checked  by Primary MD or SNF MD in 5-7 days ( we routinely change or add medications that can affect your baseline labs and fluid status, therefore we recommend that you get the mentioned basic workup next visit with your PCP, your PCP may decide not to  get them or add new tests based on their clinical decision)   Activity: As tolerated with Full fall precautions use walker/cane & assistance as needed   Disposition SNF   Diet:   DIET SOFT Fluid consistency: Nectar Thick with feeding assistance and aspiration precautions.  For Heart failure patients - Check your Weight same time everyday, if you gain over 2 pounds, or you develop in leg swelling, experience more shortness of breath or chest pain, call your Primary MD immediately. Follow Cardiac Low Salt Diet and 1.5 lit/day fluid restriction.   On your next visit with your primary care physician please Get Medicines reviewed and adjusted.   Please request your Prim.MD to go over all Hospital Tests and Procedure/Radiological results at the follow up, please get all Hospital records sent to your Prim MD by signing hospital release before you go home.   If you experience worsening of your admission symptoms, develop shortness of breath, life  threatening emergency, suicidal or homicidal thoughts you must seek medical attention immediately by calling 911 or calling your MD immediately  if symptoms less severe.  You Must read complete instructions/literature along with all the possible adverse reactions/side effects for all the Medicines you take and that have been prescribed to you. Take any new Medicines after you have completely understood and accpet all the possible adverse reactions/side effects.   Do not drive, operate heavy machinery, perform activities at heights, swimming or participation in water activities or provide baby sitting services if your were admitted for syncope or siezures until you have seen by Primary MD or a Neurologist and advised to do so again.  Do not drive when taking Pain medications.    Do not take more than prescribed Pain, Sleep and Anxiety Medications  Special Instructions: If you have smoked or chewed Tobacco  in the last 2 yrs please stop smoking, stop any regular Alcohol  and or any Recreational drug use.  Wear Seat belts while driving.   Please note  You were cared for by a hospitalist during your hospital stay. If you have any questions about your discharge medications or the care you received while you were in the hospital after you are discharged, you can call the unit and asked to speak with the hospitalist on call if the hospitalist that took care of you is not available. Once you are discharged, your primary care physician will handle any further medical issues. Please note that NO REFILLS for any discharge medications will be authorized once you are discharged, as it is imperative that you return to your primary care physician (or establish a relationship with a primary care physician if you do not have one) for your aftercare needs so that they can reassess your need for medications and monitor your lab values.   Increase activity slowly    Complete by:  As directed       Discharge  Medications     Medication List    STOP taking these medications   aspirin EC 81 MG tablet   aspirin-acetaminophen-caffeine 250-250-65 MG tablet Commonly known as:  EXCEDRIN MIGRAINE   doxycycline 100 MG tablet Commonly known as:  VIBRA-TABS   Insulin Isophane & Regular Human (70-30) 100 UNIT/ML PEN Commonly known as:  HUMULIN 70/30 KWIKPEN   metFORMIN 500 MG tablet Commonly known as:  GLUCOPHAGE   predniSONE 20 MG tablet Commonly known as:  DELTASONE     TAKE these medications   acetaminophen 500 MG tablet Commonly known as:  TYLENOL Take 1,000 mg by mouth every 6 (six) hours as needed for headache (pain).   PROAIR HFA 108 (90 Base) MCG/ACT inhaler Generic drug:  albuterol Inhale 2 puffs into the lungs every 6 (six) hours as needed for wheezing or shortness of breath.   albuterol (2.5 MG/3ML) 0.083% nebulizer solution Commonly known as:  PROVENTIL USE ONE VIAL IN NEBULIZER EVERY 4 HOURS AS NEEDED FOR WHEEZING OR SHORTNESS OF BREATH   amoxicillin-clavulanate 875-125 MG tablet Commonly known as:  AUGMENTIN Take 1 tablet by mouth every 12 (twelve) hours. 5 more days. Must follow with the drain clinic and Gen. surgery before stopping this medication   atorvastatin 80 MG tablet Commonly known as:  LIPITOR TAKE ONE TABLET BY MOUTH ONCE DAILY What changed:  See the new instructions.   clopidogrel 75 MG tablet Commonly known as:  PLAVIX Take 1 tablet (75 mg total) by mouth daily. Start taking on:  09/28/2016   feeding supplement Liqd Take 1 Container by mouth 2 (two) times daily between meals.   FLOVENT HFA 220 MCG/ACT inhaler Generic drug:  fluticasone INHALE TWO PUFFS BY MOUTH TWICE DAILY   fluticasone 50 MCG/ACT nasal spray Commonly known as:  FLONASE Place 2 sprays into both nostrils at bedtime.   furosemide 40 MG tablet Commonly known as:  LASIX Take 1 tablet (40 mg total) by mouth daily. Start taking on:  09/28/2016   insulin aspart 100 UNIT/ML  injection Commonly known as:  NOVOLOG Before each meal 3 times a day, 140-199 - 2 units, 200-250 - 4 units, 251-299 - 6 units,  300-349 - 8 units,  350 or above 10 units. Dispense syringes and needles as needed, Ok to switch to PEN if approved. Substitute to any brand approved. DX DM2, Code E11.65   lisinopril 2.5 MG tablet Commonly known as:  PRINIVIL,ZESTRIL Take 1 tablet (2.5 mg total) by mouth 2 (two) times daily.   loratadine 10 MG tablet Commonly known as:  CLARITIN Take 10 mg by mouth at bedtime.   Metoprolol Tartrate 37.5 MG Tabs Take 37.5 mg by mouth 2 (two) times daily.   nitroGLYCERIN 0.4 MG SL tablet Commonly known as:  NITROSTAT Place 1 tablet (0.4 mg total) under the tongue every 5 (five) minutes as needed. For chest pain What changed:  reasons to take this  additional instructions   pantoprazole 40 MG tablet Commonly known as:  PROTONIX TAKE ONE TABLET BY MOUTH ONCE DAILY What changed:  See the new instructions.   potassium chloride 10 MEQ tablet Commonly known as:  K-DUR Take 1 tablet (10 mEq total) by mouth daily.   prednisoLONE acetate 1 % ophthalmic suspension Commonly known as:  PRED FORTE Place 1 drop into the left eye at bedtime.   PROLENSA 0.07 % Soln Generic drug:  Bromfenac Sodium Place 1 drop into the left eye at bedtime.        Contact information for follow-up providers    MCGOWEN,PHILIP H, MD. Schedule an appointment as soon as possible for a visit in 1 week(s).   Specialty:  Family Medicine Contact information: [address] [place] [address] [phone]        Milus Banister, MD. Schedule an appointment as soon as possible for a visit in 1 week(s).   Specialty:  Gastroenterology Why:  colonoscopy Contact information: [address] [place] [address] [phone]        Minus Breeding, MD. Schedule an appointment as soon as possible for  a visit in 1 week(s).   Specialty:  Cardiology Contact  information: [address] [place] [address] [phone]        Coralie Keens A, MD Follow up in 2 week(s).   Specialty:  General Surgery Contact information: [place] [address] [phone]        Corrie Mckusick, DO. Schedule an appointment as soon as possible for a visit in 2 week(s).   Specialty:  Interventional Radiology Why:  follow up LLQ drain with injection and CT---IR drain clinic; pt will hear from clinic for time and date Contact information: [address] [place] [address] [phone]            Contact information for after-discharge care    Destination    [facility] Follow up.   Specialty:  [place] information: [place] [phone]                  Major procedures and Radiology Reports - PLEASE review detailed and final reports thoroughly  -      PROCEDURES:  OETT 7.5 11/22 >>11/23  R FEM ART LINE 11/22 >>11/24  R IJ CVL 11/22 >>  OGT 11/22 >>11/23  FOLEY 11/22 >>  STUDIES:  CT abd/pelvis 11/19: Focal diverticulitis of the mid sigmoid colon with adjacent 2.5 x 4.1 cm abscess and foci of pneumoperitoneum in this area. Small amount of complex free fluid within the pelvis.  CTchest/abd/pelvis 11/22:Radiation fibrosis changes in medial RIGHT upper lobe extending to the superior RIGHT hilum. Additional RIGHT upper lobe and scattered RIGHT lung infiltrates question pneumonia versus less likely asymmetric edema. Small BILATERAL pleural effusions RIGHT greater than LEFT with minimal adjacent compressive atelectasis of the lower lobes. Improved diverticulitis changes with resolution of the previously identified abscess collection adjacent to the sigmoid colon. Fractures of the sternum and the anterior RIGHT third and fifth ribs suspect related to CPR. Small pericardial fusion.  TTE 11/22: LV EF  30-35% w/ akinesis of mid-apicalanteroseptalmycoardium. Grade 1 diastolic dysfunciton. LA &RA normal in size. RV normal in size &function. Trivial aortic regurg w/o stenosis. Trivial mitral regurg. No pulmonic regurg. Mild tricuspid regurg. Trivial pericardial effusion.   Cath11/22/2017: Severe LV dysfunction (EF roughly 25%) with moderately-severely elevated LVEDP (25 mmHg). Mild nonobstructive CAD with 85% smooth narrowing of L2 small-caliber D1. -- Suggestive of Takotsubo Cardiomyopathy.   Ct Angio Head W Or Wo Contrast  Result Date: 09/26/2016 CLINICAL DATA:  Left arm numbness. Abnormal MRI. 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 Next item EXAM: CT ANGIOGRAPHY HEAD AND NECK CT ANGIOGRAPHY HEAD TECHNIQUE: Multidetector CT imaging of the head and neck was performed using the standard protocol during bolus administration of intravenous contrast. Multiplanar CT image reconstructions and MIPs were obtained to evaluate the vascular anatomy. Carotid stenosis measurements (when applicable) are obtained utilizing NASCET criteria, using the distal internal carotid diameter as the denominator. CONTRAST:  50 mL Isovue 3 7 a COMPARISON:  MRI brain 09/25/2016. CT head without contrast 09/25/2016. FINDINGS: CT HEAD FINDINGS Brain: The punctate subcortical left parietal lobe infarct is not visualized by CT. Moderate diffuse periventricular and subcortical white matter disease is again seen. The basal ganglia are intact. Insular ribbon is normal bilaterally. No acute hemorrhage or mass lesion is present. Vascular: No hyperdense vessel or unexpected calcification. Skull: The calvarium is intact. No focal lytic or blastic lesions are present. Sinuses: The paranasal sinuses  are clear. There is some fluid in left mastoid air cells. No obstructing nasopharyngeal lesion is present. Orbits: Bilateral lens replacements are present. The globes and orbits are otherwise within normal limits. Review of the MIP  images confirms the above findings CTA NECK FINDINGS Aortic arch: Atherosclerotic calcifications are present at the aortic arch without significant stenosis. There is a common origin of the left common carotid artery in the innominate artery. Right carotid system: The right common carotid artery is within normal limits. The bifurcation is unremarkable. There is minimal tortuosity in the cervical right ICA without significant stenosis. Left carotid system: A left common carotid artery is within normal limits. Minimal calcifications are present at the left carotid bifurcation. Mild tortuosity is present in the cervical left ICA without significant stenosis. Vertebral arteries: The right vertebral artery is dominant. The left vertebral artery origin is not visualized. A hypoplastic vessel is reconstituted at the level of C5-6 on the left with segmental stenosis at C4 and C5. There is focal high-grade stenosis at the C2 level is well. Skeleton: Multilevel degenerative changes in the cervical spine are most pronounced at C4-5 with chronic loss of disc space and slight retrolisthesis. Right greater than left foraminal narrowing is present at C3-4 and C4-5. The patient is edentulous. No focal lytic or blastic lesions are present. Other neck: No focal mucosal or submucosal lesions are present. There is no significant adenopathy. Salivary glands are within normal limits. 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 Upper chest: Right perihilar and medial upper lobe soft tissue is stable, compatible with prior treatment. Right-sided bronchiectasis is noted. The left lung is clear. Review of the MIP images confirms the above findings CTA HEAD FINDINGS Anterior circulation: The internal carotid arteries are within normal limits from the high cervical segments through the ICA termini bilaterally. There is mild narrowing on the right of less than 50%. The ICA termini are within normal limits bilaterally. The A1 and M1 segments are  normal. The left A1 segment is dominant. Anterior communicating artery is patent. MCA bifurcations are intact bilaterally. The ACA and MCA branch vessels are within normal limits. Posterior circulation: The right vertebral artery is the dominant vessel. PICA origins are below the dural margin bilaterally. The vertebrobasilar junction is normal. The basilar artery is small. Both posterior cerebral arteries originate from the basilar tip. The PCA branch vessels are within normal limits. Venous sinuses: The dural sinuses are patent. The right transverse sinus is dominant. The straight sinus and deep cerebral veins are intact. Cortical veins are unremarkable. Anatomic variants: None. Delayed phase: The postcontrast images demonstrate no pathologic enhancement. Review of the MIP images confirms the above findings IMPRESSION: 1. 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 2. 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 3. Small left parietal lobe infarct is not visualized by CT. 4. Moderate diffuse white matter disease is again seen. 5. Minimal atherosclerotic calcifications at the left carotid bifurcation without significant stenosis. 6. No significant intracranial vascular disease is evident. Electronically Signed   By: San Morelle M.D.   On: 09/26/2016 12:13   Dg Chest 2 View  Result Date: 09/21/2016 CLINICAL DATA:  Cough and shortness of Breath EXAM: CHEST  2 VIEW COMPARISON:  09/14/2016 FINDINGS: Cardiac shadow is stable. Endotracheal tube and nasogastric catheter is well as a left jugular catheter have been removed. Right jugular catheter remains. Bilateral small pleural effusions are noted. Likely some underlying atelectasis  is present as well. There is persistent right peritracheal density consistent with consolidation similar to that seen on the prior exam. No acute bony abnormality is seen. IMPRESSION: Persistent  consolidation in the right upper lobe. Bilateral pleural effusions. Electronically Signed   By: Inez Catalina M.D.   On: 09/21/2016 20:15   X-ray Chest Pa And Lateral  Result Date: 09/12/2016 CLINICAL DATA:  Preop EXAM: CHEST  2 VIEW COMPARISON:  09/07/2016 FINDINGS: Elevated right hemidiaphragm. Right medial upper lobe density again noted, unchanged. Left lung is clear. Heart is normal size. No acute bony abnormality. IMPRESSION: Stable right medial upper lobe density and elevated right hemidiaphragm. No acute findings. Electronically Signed   By: Rolm Baptise M.D.   On: 09/12/2016 13:25   Dg Chest 2 View  Result Date: 09/08/2016 CLINICAL DATA:  Cough and chest congestion for 2-3 days. History of right-sided lung malignancy, COPD, discontinued smoking in 2010. EXAM: CHEST  2 VIEW COMPARISON:  AP chest x-ray and chest CT scan of June 14, 2016 FINDINGS: There is chronic volume loss on the right. A persistent abnormal soft tissue density mass is noted in the right upper lobe. There is pleural thickening in the right apex. No blunting of the costophrenic angles on the right or left is observed. The interstitial markings are mildly prominent in the left lung but stable. There is an area patchy density lateral to the cardiac apex which appears to lie in the lingula. The heart and pulmonary vascularity are normal. There is calcification in the wall of the aortic arch. The bony thorax exhibits no acute abnormality. IMPRESSION: 1. Mild lingular atelectasis or early pneumonia. Stable parenchymal density in the right upper lobe and right paratracheal region. No CHF. Followup PA and lateral chest X-ray is recommended in 3-4 weeks following trial of antibiotic therapy to ensure resolution and exclude underlying malignancy. 2. Thoracic aortic atherosclerosis. Electronically Signed   By: David  Martinique M.D.   On: 09/08/2016 08:15   Ct Head Wo Contrast  Result Date: 09/25/2016 CLINICAL DATA:  Patient complains of  LEFT-sided weakness. History of diabetes. Mild cognitive impairment. History of brain radiation. Recent drain placement into a diverticular abscess. EXAM: CT HEAD WITHOUT CONTRAST TECHNIQUE: Contiguous axial images were obtained from the base of the skull through the vertex without intravenous contrast. COMPARISON:  06/14/2016. FINDINGS: Brain: No evidence for acute infarction, hemorrhage, mass lesion, hydrocephalus, or extra-axial fluid. Moderate cerebral and cerebellar atrophy. Extensive hypoattenuation of white matter likely representing a combination of post treatment effect and small vessel disease. No areas of vasogenic edema. Vascular: No hyperdense vessel or unexpected calcification. Skull: Normal. Negative for fracture or focal lesion. Sinuses/Orbits: No acute finding. Other: None. IMPRESSION: Atrophy and small vessel disease.  No acute intracranial findings. Electronically Signed   By: Staci Righter M.D.   On: 09/25/2016 09:55   Ct Angio Neck W Or Wo Contrast  Result Date: 09/26/2016 CLINICAL DATA:  Left arm numbness. Abnormal MRI. 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 Next item EXAM: CT ANGIOGRAPHY HEAD AND NECK CT ANGIOGRAPHY HEAD TECHNIQUE: Multidetector CT imaging of the head and neck was performed using the standard protocol during bolus administration of intravenous contrast. Multiplanar CT image reconstructions and MIPs were obtained to evaluate the vascular anatomy. Carotid stenosis measurements (when applicable) are obtained utilizing NASCET criteria, using the distal internal carotid diameter as the denominator. CONTRAST:  50 mL Isovue 3 7 a COMPARISON:  MRI brain 09/25/2016. CT head without contrast 09/25/2016. FINDINGS: CT HEAD  FINDINGS Brain: The punctate subcortical left parietal lobe infarct is not visualized by CT. Moderate diffuse periventricular and subcortical white matter disease is again seen. The basal ganglia are intact. Insular ribbon is normal bilaterally.  No acute hemorrhage or mass lesion is present. Vascular: No hyperdense vessel or unexpected calcification. Skull: The calvarium is intact. No focal lytic or blastic lesions are present. Sinuses: The paranasal sinuses are clear. There is some fluid in left mastoid air cells. No obstructing nasopharyngeal lesion is present. Orbits: Bilateral lens replacements are present. The globes and orbits are otherwise within normal limits. Review of the MIP images confirms the above findings CTA NECK FINDINGS Aortic arch: Atherosclerotic calcifications are present at the aortic arch without significant stenosis. There is a common origin of the left common carotid artery in the innominate artery. Right carotid system: The right common carotid artery is within normal limits. The bifurcation is unremarkable. There is minimal tortuosity in the cervical right ICA without significant stenosis. Left carotid system: A left common carotid artery is within normal limits. Minimal calcifications are present at the left carotid bifurcation. Mild tortuosity is present in the cervical left ICA without significant stenosis. Vertebral arteries: The right vertebral artery is dominant. The left vertebral artery origin is not visualized. A hypoplastic vessel is reconstituted at the level of C5-6 on the left with segmental stenosis at C4 and C5. There is focal high-grade stenosis at the C2 level is well. Skeleton: Multilevel degenerative changes in the cervical spine are most pronounced at C4-5 with chronic loss of disc space and slight retrolisthesis. Right greater than left foraminal narrowing is present at C3-4 and C4-5. The patient is edentulous. No focal lytic or blastic lesions are present. Other neck: No focal mucosal or submucosal lesions are present. There is no significant adenopathy. Salivary glands are within normal limits. 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 Upper chest: Right perihilar and medial upper lobe soft tissue is stable,  compatible with prior treatment. Right-sided bronchiectasis is noted. The left lung is clear. Review of the MIP images confirms the above findings CTA HEAD FINDINGS Anterior circulation: The internal carotid arteries are within normal limits from the high cervical segments through the ICA termini bilaterally. There is mild narrowing on the right of less than 50%. The ICA termini are within normal limits bilaterally. The A1 and M1 segments are normal. The left A1 segment is dominant. Anterior communicating artery is patent. MCA bifurcations are intact bilaterally. The ACA and MCA branch vessels are within normal limits. Posterior circulation: The right vertebral artery is the dominant vessel. PICA origins are below the dural margin bilaterally. The vertebrobasilar junction is normal. The basilar artery is small. Both posterior cerebral arteries originate from the basilar tip. The PCA branch vessels are within normal limits. Venous sinuses: The dural sinuses are patent. The right transverse sinus is dominant. The straight sinus and deep cerebral veins are intact. Cortical veins are unremarkable. Anatomic variants: None. Delayed phase: The postcontrast images demonstrate no pathologic enhancement. Review of the MIP images confirms the above findings IMPRESSION: 1. 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 2. 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 3. Small left parietal lobe infarct is not visualized by CT. 4. Moderate diffuse white matter disease is again seen. 5. Minimal atherosclerotic calcifications at the left carotid bifurcation without significant stenosis. 6. No significant intracranial vascular disease is evident. Electronically Signed   By: Harrell Gave  Mattern M.D.   On: 09/26/2016 12:13   Ct Chest Wo Contrast  Result Date: 09/22/2016 CLINICAL DATA:  Continued shortness of breath and coughing with chest pain for  several days. History of lung cancer EXAM: CT CHEST WITHOUT CONTRAST TECHNIQUE: Multidetector CT imaging of the chest was performed following the standard protocol without IV contrast. COMPARISON:  09/14/2016 FINDINGS: Cardiovascular: Slight increase in the small pericardial effusion. Coronary artery calcification is noted. Atherosclerotic calcification is noted in the wall of the thoracic aorta. Right-sided central line tip is positioned in the distal SVC. Mediastinum/Nodes: Persistent abnormal soft tissue attenuation identified in the right hilum and right suprahilar region. The esophagus has normal imaging features. There is no axillary lymphadenopathy. Lungs/Pleura: Airspace consolidation in the medial right upper lung and apex is not substantially changed interval, presumably representing post radiation fibrosis. The asymmetric, right greater than left ground-glass attenuation seen previously has improved in the interval as has the interlobular septal thickening. Small to moderate right pleural effusion and small left pleural effusion are slightly progressed in the interval. Upper Abdomen: Unremarkable. Musculoskeletal: Bone windows reveal no worrisome lytic or sclerotic osseous lesions. Apparent nondisplaced upper sternal fracture unchanged. IMPRESSION: 1. Post treatment changes medial right upper lobe involving the right hilum and apex are not substantially changed in the interval. 2. Scattered ground-glass attenuation with interlobular septal thickening seen previously has decreased in the interval. 3. Slight progression of bilateral pleural effusions. Electronically Signed   By: Misty Stanley M.D.   On: 09/22/2016 11:32   Ct Chest W Contrast  Result Date: 09/14/2016 CLINICAL DATA:  Diverticulitis post abscess drainage and surgery, complicated by a supraventricular tachycardia then cardiac arrest post CPR and defibrillation, intubation, hypertensive, history COPD, coronary disease, diabetes mellitus,  hypertension, non-small-cell lung cancer EXAM: CT CHEST, ABDOMEN, AND PELVIS WITH CONTRAST TECHNIQUE: Multidetector CT imaging of the chest, abdomen and pelvis was performed following the standard protocol during bolus administration of intravenous contrast. Sagittal and coronal MPR images reconstructed from axial data set. CONTRAST:  Dilute oral contrast.  100 cc Isovue 300 IV. COMPARISON:  CT chest 06/14/2016, CT abdomen and pelvis 09/11/2016 FINDINGS: CT CHEST FINDINGS Cardiovascular: Atherosclerotic calcifications aorta and coronary arteries. Aorta normal caliber. Thoracic vascular structures grossly patent on nondedicated exam. Small pericardial fusion. Mediastinum/Nodes: Nasogastric tube traverses esophagus into stomach. Tip of endotracheal tube above carina. Small dense calcification within RIGHT thyroid lobe is unchanged. Base of cervical region otherwise unremarkable. Palpable with soft tissue density at the RIGHT hilum and paramediastinal RIGHT upper lobe, patient with known radiation fibrosis. Lungs/Pleura: Small BILATERAL pleural effusions larger on RIGHT. In addition to the previously identified radiation fibrosis changes in the RIGHT upper lobe, additional RIGHT upper lobe consolidation is seen question pneumonia. Compressive atelectasis in the lower lobes. Additional scattered airspace infiltrates primarily in RIGHT lung which could represent infection or less likely asymmetric edema. No pneumothorax. Musculoskeletal: Essentially nondisplaced mid sternal fracture sagittal image 81. Fractures of the anterior RIGHT third and fifth ribs. CT ABDOMEN PELVIS FINDINGS Hepatobiliary: Gallbladder surgically absent. Mild central intrahepatic and extrahepatic biliary dilatation little changed. No focal liver lesions. Pancreas: Normal appearance Spleen: Normal appearance Adrenals/Urinary Tract: Adrenal glands normal appearance. Foley catheter decompresses urinary bladder. Kidneys in ureters normal appearance.  Stomach/Bowel: Diverticulosis changes at the sigmoid colon with decreased surrounding inflammation versus prior study. Abscess collection seen on the previous exam no longer identified, pigtail drainage catheter at site. Normal appendix. Stomach and bowel loops otherwise normal appearance. Vascular/Lymphatic: Atherosclerotic calcification aorta without aneurysm. No significant  adenopathy. Reproductive: Uterus surgically absent. Other: Small amounts of free pelvic fluid and perihepatic/perisplenic fluid without free air. Small umbilical hernia containing fat. Musculoskeletal: No acute osseous findings. 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 Additional RIGHT upper lobe and scattered RIGHT lung infiltrates question pneumonia versus less likely asymmetric edema. Small BILATERAL pleural effusions RIGHT greater than LEFT with minimal adjacent compressive atelectasis of the lower lobes. Improved diverticulitis changes with resolution of the previously identified abscess collection adjacent to the sigmoid colon. Fractures of the sternum and the anterior RIGHT third and fifth ribs suspect related to CPR. Small pericardial fusion. Electronically Signed   By: Lavonia Dana M.D.   On: 09/14/2016 13:01   Mr Brain Wo Contrast  Result Date: 09/25/2016 CLINICAL DATA:  Left arm numbness EXAM: MRI HEAD WITHOUT CONTRAST TECHNIQUE: Multiplanar, multiecho pulse sequences of the brain and surrounding structures were obtained without intravenous contrast. COMPARISON:  Head CT from earlier today. FINDINGS: Brain: Restricted diffusion in the subcortical left parietal lobe consistent with acute infarct. No definite explanation for left arm numbness. No hemorrhage, hydrocephalus, or mass. Confluent cerebral white matter T2 and FLAIR hyperintensity. Generalized atrophy, progressed from 2015 and greater than expected for age Vascular: There is loss of expected flow void in the  left vertebral artery at the V3 segment. Normal flow void in the intradural segment but smaller than on comparison scan. Skull and upper cervical spine: No focal lesion. Sinuses/Orbits: Bilateral cataract resection. IMPRESSION: 1. 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 2. 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 Generalized atrophy has progressed from 2015. 3. Abnormal appearance of the left vertebral artery suggesting slow flow or occlusion in the neck, new from 2015. Electronically Signed   By: Monte Fantasia M.D.   On: 09/25/2016 16:06   Ct Abdomen Pelvis W Contrast  Result Date: 09/20/2016 CLINICAL DATA:  Diverticulitis. EXAM: CT ABDOMEN AND PELVIS WITH CONTRAST TECHNIQUE: Multidetector CT imaging of the abdomen and pelvis was performed using the standard protocol following bolus administration of intravenous contrast. CONTRAST:  110m ISOVUE-300 IOPAMIDOL (ISOVUE-300) INJECTION 61% COMPARISON:  CT scan of September 14, 2016. FINDINGS: Lower chest: Mild bilateral pleural effusions are noted with adjacent subsegmental atelectasis. Hepatobiliary: Status post cholecystectomy. Stable intrahepatic biliary dilatation is noted which most likely is related to post cholecystectomy status. No focal abnormality is seen in the liver. Pancreas: Unremarkable. No pancreatic ductal dilatation or surrounding inflammatory changes. Spleen: Normal in size without focal abnormality. Adrenals/Urinary Tract: Adrenal glands are unremarkable. Kidneys are normal, without renal calculi, focal lesion, or hydronephrosis. Bladder is unremarkable. Stomach/Bowel: Drainage catheter noted in para diverticular abscess on prior exam has been removed. 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 Mild inflammatory changes and  fluid remain within the pelvis. There is no evidence of bowel obstruction. The appendix appears normal. Vascular/Lymphatic: Aortic atherosclerosis. No enlarged abdominal or pelvic lymph nodes. Reproductive: Status post hysterectomy. No adnexal masses. Other: None. Musculoskeletal: Mild degenerative disc disease is noted at L5-S1. IMPRESSION: Drainage catheter noted in paradiverticular abscess on prior exam has been removed. 3.9 x 2.1 cm persistent fluid collection with air-fluid level remains, concerning for abscess and possible fistulous connection to the sigmoid colon. Mild inflammatory changes in fluid remain within the pelvis. Aortic atherosclerosis. Mild bilateral pleural effusions with adjacent  subsegmental atelectasis. Electronically Signed   By: Marijo Conception, M.D.   On: 09/20/2016 15:45   Ct Abdomen Pelvis W Contrast  Result Date: 09/14/2016 CLINICAL DATA:  Diverticulitis post abscess drainage and surgery, complicated by a supraventricular tachycardia then cardiac arrest post CPR and defibrillation, intubation, hypertensive, history COPD, coronary disease, diabetes mellitus, hypertension, non-small-cell lung cancer EXAM: CT CHEST, ABDOMEN, AND PELVIS WITH CONTRAST TECHNIQUE: Multidetector CT imaging of the chest, abdomen and pelvis was performed following the standard protocol during bolus administration of intravenous contrast. Sagittal and coronal MPR images reconstructed from axial data set. CONTRAST:  Dilute oral contrast.  100 cc Isovue 300 IV. COMPARISON:  CT chest 06/14/2016, CT abdomen and pelvis 09/11/2016 FINDINGS: CT CHEST FINDINGS Cardiovascular: Atherosclerotic calcifications aorta and coronary arteries. Aorta normal caliber. Thoracic vascular structures grossly patent on nondedicated exam. Small pericardial fusion. Mediastinum/Nodes: Nasogastric tube traverses esophagus into stomach. Tip of endotracheal tube above carina. Small dense calcification within RIGHT thyroid lobe is unchanged.  Base of cervical region otherwise unremarkable. Palpable with soft tissue density at the RIGHT hilum and paramediastinal RIGHT upper lobe, patient with known radiation fibrosis. Lungs/Pleura: Small BILATERAL pleural effusions larger on RIGHT. In addition to the previously identified radiation fibrosis changes in the RIGHT upper lobe, additional RIGHT upper lobe consolidation is seen question pneumonia. Compressive atelectasis in the lower lobes. Additional scattered airspace infiltrates primarily in RIGHT lung which could represent infection or less likely asymmetric edema. No pneumothorax. Musculoskeletal: Essentially nondisplaced mid sternal fracture sagittal image 81. Fractures of the anterior RIGHT third and fifth ribs. CT ABDOMEN PELVIS FINDINGS Hepatobiliary: Gallbladder surgically absent. Mild central intrahepatic and extrahepatic biliary dilatation little changed. No focal liver lesions. Pancreas: Normal appearance Spleen: Normal appearance Adrenals/Urinary Tract: Adrenal glands normal appearance. Foley catheter decompresses urinary bladder. Kidneys in ureters normal appearance. Stomach/Bowel: Diverticulosis changes at the sigmoid colon with decreased surrounding inflammation versus prior study. Abscess collection seen on the previous exam no longer identified, pigtail drainage catheter at site. Normal appendix. Stomach and bowel loops otherwise normal appearance. Vascular/Lymphatic: Atherosclerotic calcification aorta without aneurysm. No significant adenopathy. Reproductive: Uterus surgically absent. Other: Small amounts of free pelvic fluid and perihepatic/perisplenic fluid without free air. Small umbilical hernia containing fat. Musculoskeletal: No acute osseous findings. 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 Additional RIGHT upper lobe and scattered RIGHT lung infiltrates question pneumonia versus less likely asymmetric edema. Small  BILATERAL pleural effusions RIGHT greater than LEFT with minimal adjacent compressive atelectasis of the lower lobes. Improved diverticulitis changes with resolution of the previously identified abscess collection adjacent to the sigmoid colon. Fractures of the sternum and the anterior RIGHT third and fifth ribs suspect related to CPR. Small pericardial fusion. Electronically Signed   By: Lavonia Dana M.D.   On: 09/14/2016 13:01   Ct Abdomen Pelvis W Contrast  Result Date: 09/11/2016 CLINICAL DATA:  70 year old female with abdominal, pelvic and flank pain for 1 day. History of lung cancer, appendectomy, cholecystectomy and hysterectomy. EXAM: CT ABDOMEN AND PELVIS WITH CONTRAST TECHNIQUE: Multidetector CT imaging of the abdomen and pelvis was performed using the standard protocol following bolus administration of intravenous contrast. CONTRAST:  142m ISOVUE-300 IOPAMIDOL (ISOVUE-300) INJECTION 61% COMPARISON:  06/14/2016 and prior exams. FINDINGS: Lower chest: No acute abnormality. Hepatobiliary: No acute or suspicious hepatic abnormality. The patient is status post cholecystectomy. Pancreas: Unremarkable Spleen: Unremarkable Adrenals/Urinary Tract: The kidneys, adrenal glands and bladder are unremarkable. Stomach/Bowel: Focal diverticulitis of the mid sigmoid colon  is noted. A 2.5 x 4.1 cm abscess along the medial aspect of the mid sigmoid colon is noted (image 73). Adjacent foci of free air and along the left pelvic side wall are noted. A small amount of free complex fluid within the pelvis is present. There is no evidence of bowel obstruction. No other focal areas of bowel wall thickening are present. The appendix is normal. Vascular/Lymphatic: Abdominal aortic atherosclerotic calcifications noted without aneurysm. No enlarged lymph nodes identified. Reproductive: Status post hysterectomy. No adnexal masses. Other: None Musculoskeletal: No acute or suspicious abnormalities. IMPRESSION: Focal diverticulitis  of the mid sigmoid colon with adjacent 2.5 x 4.1 cm abscess and foci of pneumoperitoneum in this area. Small amount of complex free fluid within the pelvis. Abdominal aortic atherosclerosis. Electronically Signed   By: Margarette Canada M.D.   On: 09/11/2016 13:38   Ir Sinus/fist Tube Chk-non Gi  Result Date: 09/19/2016 INDICATION: 70 year old female with a history of diverticular abscess, status post drainage. EXAM: SINUS TRACT INJECTION/FISTULOGRAM MEDICATIONS: The patient is currently admitted to the hospital and receiving intravenous antibiotics. The antibiotics were administered within an appropriate time frame prior to the initiation of the procedure. ANESTHESIA/SEDATION: None COMPLICATIONS: None PROCEDURE: Informed written consent was obtained from the patient after a thorough discussion of the procedural risks, benefits and alternatives. All questions were addressed. Maximal Sterile Barrier Technique was utilized. Time out was performed. The indwelling pelvis drain was injected under fluoroscopy. Spot images were acquired in multiple obliquities. Catheter was attached to drainage bag. FINDINGS: No fistula identified. Free contrast within the pelvis with no large residual collection. IMPRESSION: Status post drain injection demonstrating no fistula or residual large pelvic collection. Signed, Dulcy Fanny. Earleen Newport, DO [facility] Electronically Signed   By: Corrie Mckusick D.O.   On: 09/19/2016 09:51   Dg Chest Port 1 View  Result Date: 09/22/2016 CLINICAL DATA:  Shortness of breath. EXAM: PORTABLE CHEST 1 VIEW COMPARISON:  09/21/2016 .  CT 09/14/2016. FINDINGS: Right IJ line in stable position. Persistent right upper lobe mass/ infiltrate and probable post treatment changes noted. Low lung volumes with mild basilar atelectasis. No prominent pleural effusion. No pneumothorax. Heart size normal. No acute bony abnormality . IMPRESSION: 1.  Right IJ line  stable position. 2. Persistent right upper lobe mass/infiltrate and probable post treatment changes. No significant change. Low lung volumes. Chest is stable from prior exam . Electronically Signed   By: Marcello Moores  Register   On: 09/22/2016 07:31   Dg Chest Port 1 View  Result Date: 09/14/2016 CLINICAL DATA:  Central line insertion. EXAM: PORTABLE CHEST 1 VIEW 6:36 p.m. COMPARISON:  09/14/2016 8:40 a.m. FINDINGS: New right central line is been inserted and the tip appears in excellent position. Left central line, endotracheal tube, and NG tube appear in good position, unchanged. Right pleural effusion caps the right apex, unchanged. No pneumothorax. Scarring at the superior aspect of the right hilum, obscured by the ventilation apparatus. IMPRESSION: New right-sided central line appears in good position. No acute changes. Electronically Signed   By: Lorriane Shire M.D.   On: 09/14/2016 19:01   Dg Chest Port 1 View  Result Date: 09/14/2016 CLINICAL DATA:  Central line placement EXAM: PORTABLE CHEST 1 VIEW COMPARISON:  09/14/2016 FINDINGS: Cardiomediastinal silhouette is stable. Stable endotracheal tube position. NG tube in place. There is persistent consolidation in right upper lobe. Left IJ central line with tip in distal SVC. No pneumothorax. IMPRESSION: Stable support apparatus. Left IJ central line  in place. No pneumothorax. Electronically Signed   By: [name] M.D.   On: 09/14/2016 09:09   Dg Chest Port 1 View  Result Date: 09/14/2016 CLINICAL DATA:  Hypoxia EXAM: PORTABLE CHEST 1 VIEW COMPARISON:  September 12, 2016 chest radiograph and chest CT June 14, 2016 FINDINGS: Endotracheal tube tip is 3.1 cm above the carina. No pneumothorax. There is persistent airspace consolidation throughout much of the right upper lobe. There is suspected residual mass in this area medially. The lungs elsewhere are clear. Heart size and pulmonary vascularity are normal. No adenopathy. There is atherosclerotic  calcification in the aorta. No bone lesions. Incidental note is made of small cervical ribs bilaterally. IMPRESSION: Endotracheal tube as described without pneumothorax. There is airspace consolidation throughout much the right upper lobe. There is a nodular area in the medial aspect of the right upper lobe measuring 2.3 x 2.0 cm, concerning for residual mass in this area. This finding may warrant correlation with contrast enhanced chest CT to further assess. 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 Lungs elsewhere clear. Stable cardiac silhouette. There is aortic atherosclerosis. Electronically Signed   By: Lowella Grip III M.D.   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 EXAM: CT IMAGE GUIDED FLUID DRAIN BY CATHETER MEDICATIONS: The patient is currently admitted to the hospital and receiving intravenous antibiotics. The antibiotics were administered within an appropriate time frame prior to the initiation of the procedure. ANESTHESIA/SEDATION: Fentanyl 100 mcg IV; Versed 2 mg IV Moderate Sedation Time:  15 minutes The patient was continuously monitored during the procedure by the interventional radiology nurse under my direct supervision. COMPLICATIONS: None immediate. PROCEDURE: Informed written consent was obtained from the patient after a thorough discussion of the procedural risks, benefits and alternatives. All questions were addressed. Maximal Sterile Barrier Technique was utilized including caps, mask, sterile gowns, sterile gloves, sterile drape, hand hygiene and skin antiseptic. A timeout was performed prior to the initiation of the procedure. A planning axial CT scan was performed. The fluid and gas collection adjacent to the sigmoid colon was successfully identified. A suitable skin entry site was selected and marked. The  region was sterilely prepped and draped in standard fashion using chlorhexidine skin prep. Local anesthesia was attained by infiltration with 1% lidocaine. Under intermittent CT guidance, a 20 cm 18 gauge trocar needle was advanced into the fluid and gas collection. Initial aspiration yields frankly purulent material. Therefore, the decision was made to proceed with drain placement. An Amplatz wire was advanced through the needle and coiled within the fluid and gas collection. The needle was removed and the skin tract was dilated to 10 [place]. A Cook 10.2 [place] all-purpose drainage catheter was then advanced over the wire and formed in the fluid collection. Aspiration yields approximately 10 mL thick purulent material. A sample was sent for culture. The catheter was flushed and connected to JP bulb suction. The catheter was then secured to the skin with 0 Prolene suture and an adhesive fixation device. IMPRESSION: Successful CT-guided drain placement into recurrent diverticular abscess. Electronically Signed   By: Jacqulynn Cadet M.D.   On: 09/21/2016 16:35   Ct Image Guided Fluid Drain By Catheter  Result Date: 09/12/2016 CLINICAL DATA:  Sigmoid diverticulitis with focal diverticular abscess. EXAM: CT GUIDED CATHETER DRAINAGE OF PERITONEAL ABSCESS ANESTHESIA/SEDATION: 1.5 mg IV Versed 50 mcg  IV Fentanyl Total Moderate Sedation Time:  30 minutes The patient's level of consciousness and physiologic status were continuously monitored during the procedure by Radiology nursing. PROCEDURE: The procedure, risks, benefits, and alternatives were explained to the patient. Questions regarding the procedure were encouraged and answered. The patient understands and consents to the procedure. A time out was performed prior to initiating the procedure. The anterior abdominal wall was prepped with chlorhexidine in a sterile fashion, and a sterile drape was applied covering the operative field. A sterile gown and sterile  gloves were used for the procedure. Local anesthesia was provided with 1% Lidocaine. CT was performed in a supine position through the pelvis. Under CT guidance, an 18 gauge trocar needle was advanced to the level of a sigmoid diverticular abscess. After advancing a needle into the abscess, a guidewire was advanced. The tract was dilated and a 10 French percutaneous drainage catheter advanced over the wire. Catheter position was confirmed by CT. A fluid sample was aspirated and sent for culture analysis. The catheter was connected to suction bulb drainage. It was secured at the skin with a Prolene retention suture and StatLock device. COMPLICATIONS: None FINDINGS: Anterior percutaneous window was present to the level of the diverticular abscess located medial to the sigmoid colon. Aspiration yielded bloody and purulent fluid. The drainage catheter was able to be formed in the abscess. IMPRESSION: CT-guided percutaneous drainage of diverticular peritoneal abscess located adjacent to the sigmoid colon. A sample of purulent fluid was sent for culture analysis. Electronically Signed   By: Aletta Edouard M.D.   On: 09/12/2016 16:45    Micro Results     Recent Results (from the past 240 hour(s))  Aerobic/Anaerobic Culture (surgical/deep wound)     Status: None   Collection Time: 09/21/16  4:16 PM  Result Value Ref Range Status   Specimen Description ABSCESS  Final   Special Requests ABDOMEN  Final   Gram Stain   Final    ABUNDANT WBC PRESENT, PREDOMINANTLY PMN MODERATE GRAM NEGATIVE RODS FEW GRAM POSITIVE RODS FEW GRAM POSITIVE COCCI IN PAIRS    Culture   Final    RARE KLEBSIELLA PNEUMONIAE NO ANAEROBES ISOLATED    Report Status 09/26/2016 FINAL  Final   Organism ID, Bacteria KLEBSIELLA PNEUMONIAE  Final      Susceptibility   Klebsiella pneumoniae - MIC*    AMPICILLIN >=32 RESISTANT Resistant     CEFAZOLIN <=4 SENSITIVE Sensitive     CEFEPIME <=1 SENSITIVE Sensitive     CEFTAZIDIME <=1  SENSITIVE Sensitive     CEFTRIAXONE <=1 SENSITIVE Sensitive     CIPROFLOXACIN <=0.25 SENSITIVE Sensitive     GENTAMICIN <=1 SENSITIVE Sensitive     IMIPENEM <=0.25 SENSITIVE Sensitive     TRIMETH/SULFA <=20 SENSITIVE Sensitive     AMPICILLIN/SULBACTAM 8 SENSITIVE Sensitive     PIP/TAZO 16 SENSITIVE Sensitive     Extended ESBL NEGATIVE Sensitive     * RARE KLEBSIELLA PNEUMONIAE  C difficile quick scan w PCR reflex     Status: None   Collection Time: 09/22/16  1:32 AM  Result Value Ref Range Status   C Diff antigen NEGATIVE NEGATIVE Final   C Diff toxin NEGATIVE NEGATIVE Final   C Diff interpretation No C. difficile detected.  Final    Today   Subjective    Artina Minella today has no headache,no chest abdominal pain,no new weakness tingling or numbness, feels much better    Objective   Blood pressure (!) 115/52, pulse  95, temperature 97.7 F (36.5 C), temperature source Oral, resp. rate 18, height '5\' 2"'$  (1.575 m), weight 70.4 kg (155 lb 3.2 oz), SpO2 95 %.   Intake/Output Summary (Last 24 hours) at 09/27/16 1104 Last data filed at 09/27/16 0900  Gross per 24 hour  Intake              605 ml  Output              600 ml  Net                5 ml    Exam Awake Alert, Oriented x 3, No new F.N deficits, Normal affect Funk.AT,PERRAL Supple Neck,No JVD, No cervical lymphadenopathy appriciated.  Symmetrical Chest wall movement, Good air movement bilaterally, no rales RRR,No Gallops,Rubs or new Murmurs, No Parasternal Heave +ve B.Sounds, Abd Soft, Non tender, No organomegaly appriciated, No rebound -guarding or rigidity.Abdominal drain in place No Cyanosis, Clubbing or edema, No new Rash or bruise   Data Review   CBC w Diff:  Lab Results  Component Value Date   WBC 10.3 09/27/2016   HGB 10.3 (L) 09/27/2016   HGB 14.0 01/15/2015   HCT 31.4 (L) 09/27/2016   HCT 42.0 01/15/2015   PLT 314 09/27/2016   PLT 196 01/15/2015   LYMPHOPCT 14 09/16/2016   LYMPHOPCT 24.6 01/15/2015     MONOPCT 10 09/16/2016   MONOPCT 10.0 01/15/2015   EOSPCT 2 09/16/2016   EOSPCT 3.7 01/15/2015   BASOPCT 0 09/16/2016   BASOPCT 0.3 01/15/2015    CMP:  Lab Results  Component Value Date   NA 136 09/27/2016   NA 141 01/15/2015   K 3.9 09/27/2016   K 4.0 01/15/2015   CL 100 (L) 09/27/2016   CL 105 02/18/2013   CO2 29 09/27/2016   CO2 25 01/15/2015   BUN 12 09/27/2016   BUN 9.0 01/15/2015   CREATININE 0.90 09/27/2016   CREATININE 0.7 01/15/2015   PROT 5.2 (L) 09/12/2016   PROT 7.1 01/15/2015   ALBUMIN 2.3 (L) 09/16/2016   ALBUMIN 3.7 01/15/2015   BILITOT 0.7 09/12/2016   BILITOT 0.34 01/15/2015   ALKPHOS 67 09/12/2016   ALKPHOS 92 01/15/2015   AST 14 (L) 09/12/2016   AST 19 01/15/2015   ALT 13 (L) 09/12/2016   ALT 20 01/15/2015  . Lab Results  Component Value Date   HGBA1C 7.1 (H) 09/11/2016   Lab Results  Component Value Date   CHOL 135 09/26/2016   HDL 24 (L) 09/26/2016   LDLCALC 69 09/26/2016   LDLDIRECT 162.0 07/17/2015   TRIG 208 (H) 09/26/2016   CHOLHDL 5.6 09/26/2016     Total Time in preparing paper work, data evaluation and todays exam - 35 minutes  Lala Lund K M.D on 09/27/2016 at 11:04 AM  [facility]   Office  [phone]

## 2016-09-24 NOTE — Progress Notes (Signed)
S: No changes. Planning discharge today. Eating well. Pain only around the drain.   Vitals, labs, intake/output, and orders reviewed at this time. Minimal drain output.   Gen: A&Ox3, no distress  H&N: EOMI, atraumatic, neck supple Chest: unlabored respirations, RRR Abd: soft, nontender, nondistended, drain output seropurulent Ext: warm, no edema Neuro: grossly normal  Lines/tubes/drains: PIV, perc drain  A/P:  Diverticulitis w/ abscess s/p perc drain, doing well. Appears as if discharge is planned for today. Follow up in drain clinic next wk with CT scan.   Romana Juniper, MD [facility], [place] Pager [phone]

## 2016-09-25 ENCOUNTER — Other Ambulatory Visit (HOSPITAL_COMMUNITY): Payer: Self-pay | Admitting: Radiology

## 2016-09-25 ENCOUNTER — Inpatient Hospital Stay (HOSPITAL_COMMUNITY): Payer: Medicare Other

## 2016-09-25 LAB — GLUCOSE, CAPILLARY
GLUCOSE-CAPILLARY: 188 mg/dL — AB (ref 65–99)
GLUCOSE-CAPILLARY: 197 mg/dL — AB (ref 65–99)
GLUCOSE-CAPILLARY: 142 mg/dL — AB (ref 65–99)
GLUCOSE-CAPILLARY: 161 mg/dL — AB (ref 65–99)

## 2016-09-25 MED ORDER — FUROSEMIDE 10 MG/ML IJ SOLN
INTRAMUSCULAR | Status: AC
  Administered 2016-09-25: 40 mg
  Filled 2016-09-25: qty 4

## 2016-09-25 MED ORDER — LORAZEPAM 2 MG/ML IJ SOLN: 1.0000 mg | Freq: Once | INTRAMUSCULAR | Status: DC | PRN

## 2016-09-25 NOTE — Care Management Note (Signed)
Case Management Note Marvetta Gibbons RN, BSN Unit 2W-Case Manager [phone]  Patient Details  Name: ALYSEN SMYLIE MRN: [id number] Date of Birth: [birth year]  Subjective/Objective:  Diverticulitis with abscess s/p drain                   Action/Plan: PTA pt lived at home- per PT eval recommendation for [facility]- CSW following for placement needs- plan is for [employer].   Expected Discharge Date:     09/25/16             Expected Discharge Plan:  Skilled Nursing Facility  In-House Referral:  Clinical Social Work  Discharge planning Services  CM Consult  Post Acute Care Choice:    Choice offered to:     DME Arranged:    DME Agency:     HH Arranged:    [place] Agency:     Status of Service:  Completed, signed off  If discussed at [employer], dates discussed:    Additional Comments:  Dawayne Patricia, RN 09/25/2016, 10:23 AM

## 2016-09-25 NOTE — Progress Notes (Signed)
PROGRESS NOTE    [name]  WCB:[id number] DOB: 14-Dec-1945 DOA: 09/11/2016   PCP: [name], MD   Brief Narrative:   70 y.o. female with hx diverticulitis, CAD, COPD, DM, HTN, hx NSCLC in 2012, initially presented 11/19 with abd pain.  Initial w/u revealed diverticulitis with abscess.  She was seen by surgery, started on zosyn and had IR drain placed 11/20.  She was improving slowly until 11/22 when she was found to be in SVT 190's, then subsequently had PEA arrest with total 6 mins CPR, Defib x 2 for VFib. Intubated during code, awake post arrest but remained hypotensive.   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  Assessment & Plan:   Diverticular abscess -  Had intra-abdominal drain placed by IR on 09/12/2016 and removed on 09/19/2016, she is on IV antibiotics starting date 09/11/2016, was transitioned to Unasyn on 09/21/2016 and to Augmentin on 09/23/2016, repeat imaging shows reaccumulation of abscess and she had another drain placement on 09/21/2016. Surgery and IR following , she is clinically better tolerating soft diet, now agreeable to SNF and will be discharged to SNF once bed is arranged. We will discharge her on oral Augmentin with outpatient surgery and IR follow-up. Outpatient colonoscopy by Dr. Ardis Hughs.   Morning of 09/25/2016 and left-sided tingling numbness lasting a few minutes. She was examined within 10 minutes by me. No focal deficits, no pronator drift, no facial droop, no dysarthria. Head CT unremarkable. Doubt this was a neurological 11 but will check MRI brain to rule out CVA. Recent echo noted as below. Continue aspirin for now.  Cardiac arrest with polymorphic VT EF 25%. With mild acute on chronic systolic CHF. - total 6 mins CPR, Defib x 2 for VFib, intubated during code.  Cards saw the patient, she underwent left heart  catheterization on 09/14/2016 with clean coronaries, presentation suspicious for Takotsubo cardiac myopathy. Cards and EP recommend continuing beta blocker and Ace. She developed some shortness of breath and rales on 09/22/2016 and a trial of IV Lasix will be given, cardiology following, We will continue to monitor electrolytes, repeat echo in 6-8 weeks   Acute respiratory failure ([place]) - s/p polymorphic V Tach arrest, extubated 11/23, improved,  continue to wean O2 as tolerated  Mild acute on chronic systolic heart failure EF 25% with shortness of breath on 09/21/2016. Diagnosed clinically, much better after couple of doses of Lasix. Now compensated off oxygen and not short of breath.   Hypokalemia - supplemented will monitor  Acute kidney injury  - resolved    Hypertension, essential  - On lisinopril and metoprolol and BP reasonably controlled     COPD - stable, nebs PRN  Chronic right-sided lung opacity. On CT scan consistent with previous radiation-related changes, no productive cough or fevers, will monitor.  DM (diabetes mellitus), type 2, uncontrolled with long term use insulin ([place]) - on SSI now - on insulin 70/30 and metformin at home but held here until oral intake improves  CBG (last 3)   Recent Labs  09/24/16 1625 09/24/16 2046 09/25/16 0648  GLUCAP 160* 210* 188*    Lab Results  Component Value Date   HGBA1C 7.1 (H) 09/11/2016     DVT prophylaxis: Hep SQ Code Status: Full  Family Communication: none at bedside Disposition Plan: ready when consultants clear, also PT eval pending   CONSULTANTS:  CCS  IR  Cards  EP  PROCEDURES:  OETT 7.5 11/22 >> 11/23  R FEM ART LINE 11/22 >> 11/24  R IJ CVL 11/22 >>  OGT 11/22 >> 11/23  FOLEY 11/22 >>    STUDIES:   CT abd/pelvis 11/19: Focal diverticulitis of the mid sigmoid colon with adjacent 2.5 x 4.1 cm abscess and foci of pneumoperitoneum in this area. Small amount of complex free fluid within the  pelvis.  CT chest/abd/pelvis 11/22: Radiation fibrosis changes in medial RIGHT upper lobe extending to the superior RIGHT hilum. Additional RIGHT upper lobe and scattered RIGHT lung infiltrates question pneumonia versus less likely asymmetric edema. Small BILATERAL pleural effusions RIGHT greater than LEFT with minimal adjacent compressive atelectasis of the lower lobes. Improved diverticulitis changes with resolution of the previously identified abscess collection adjacent to the sigmoid colon. Fractures of the sternum and the anterior RIGHT third and fifth ribs suspect related to CPR. Small pericardial fusion.  TTE 11/22:  LV EF 30-35% w/ akinesis of mid-apicalanteroseptalmycoardium. Grade 1 diastolic dysfunciton. LA & RA normal in size. RV normal in size & function. Trivial aortic regurg w/o stenosis. Trivial mitral regurg. No pulmonic regurg. Mild tricuspid regurg. Trivial pericardial effusion.   Cath11/22/2017: Severe LV dysfunction (EF roughly 25%) with moderately-severely elevated LVEDP (25 mmHg). Mild nonobstructive CAD with 85% smooth narrowing of L2 small-caliber D1. -- Suggestive of Takotsubo Cardiomyopathy.  MICROBIOLOGY:  Blood Ctx x2 11/19 >>  Urine Ctx 11/19:  Multiple species present  Diverticular Abscess Ctx 11/20:  Klebsiella pneumoniae (resistant Ampicillin)  MRSA PCR 11/22:  Negative  Blood Ctx x2 11/22 >>  ANTIBIOTICS:  Anti-infectives    Start     Dose/Rate Route Frequency Ordered Stop   09/24/16 0000  amoxicillin-clavulanate (AUGMENTIN) 875-125 MG tablet     1 tablet Oral Every 12 hours 09/24/16 1043     09/23/16 1400  amoxicillin-clavulanate (AUGMENTIN) 875-125 MG per tablet 1 tablet     1 tablet Oral Every 12 hours 09/23/16 0928 09/30/16 0959   09/21/16 1400  Ampicillin-Sulbactam (UNASYN) 3 g in sodium chloride 0.9 % 100 mL IVPB  Status:  Discontinued     3 g 200 mL/hr over 30 Minutes Intravenous Every 6 hours 09/21/16 1348 09/23/16 0928   09/15/16 1000   anidulafungin (ERAXIS) 100 mg in sodium chloride 0.9 % 100 mL IVPB  Status:  Discontinued     100 mg over 90 Minutes Intravenous Every 24 hours 09/14/16 0906 09/15/16 1411   09/14/16 1000  anidulafungin (ERAXIS) 200 mg in sodium chloride 0.9 % 200 mL IVPB     200 mg over 180 Minutes Intravenous  Once 09/14/16 0841 09/14/16 2255   09/14/16 0800  vancomycin (VANCOCIN) IVPB 1000 mg/200 mL premix  Status:  Discontinued     1,000 mg 200 mL/hr over 60 Minutes Intravenous Every 12 hours 09/14/16 0737 09/16/16 0903   09/11/16 2200  piperacillin-tazobactam (ZOSYN) IVPB 3.375 g  Status:  Discontinued     3.375 g 12.5 mL/hr over 240 Minutes Intravenous Every 8 hours 09/11/16 1840 09/21/16 1348   09/11/16 1400  piperacillin-tazobactam (ZOSYN) IVPB 3.375 g     3.375 g 100 mL/hr over 30 Minutes Intravenous  Once 09/11/16 1346 09/11/16 1502      Subjective:  Patient sitting in chair, feels better today, shortness of breath has resolved, no chest or abdominal pain, mild pressure around the site of abdominal drain. Early this morning she had some left-sided tingling numbness lasting few minutes which has completely resolved.  Objective: Vitals:  09/24/16 2006 09/25/16 0459 09/25/16 0535 09/25/16 1018  BP: (!) 115/49 (!) 125/52  130/61  Pulse: 94 95  (!) 107  Resp: 20 20    Temp: 98.1 F (36.7 C) 97.8 F (36.6 C)    TempSrc: Oral Oral    SpO2: 97% 95% 97%   Weight:  72.3 kg (159 lb 6.4 oz)    Height:        Intake/Output Summary (Last 24 hours) at 09/25/16 1059 Last data filed at 09/25/16 1025  Gross per 24 hour  Intake              643 ml  Output              700 ml  Net              -57 ml   Filed Weights   09/24/16 0321 09/24/16 0322 09/25/16 0459  Weight: 72.4 kg (159 lb 9.6 oz) 72.4 kg (159 lb 9.8 oz) 72.3 kg (159 lb 6.4 oz)    Examination:  General exam: Appears calm and comfortable, AAOx3, no distress today  Respiratory system: faint basilar crackles. Cardiovascular system:  S1 & S2 heard, RRR.  Gastrointestinal system: Abdomen is nondistended, soft, Non tender  Central nervous system: Alert and oriented. No focal neurological deficits. Extremities: Symmetric 5 x 5 power. Skin: No rashes, lesions or ulcers  Data Reviewed: I have personally reviewed following labs and imaging studies  CBC:  Recent Labs Lab 09/20/16 0815 09/21/16 0331 09/22/16 0532 09/23/16 0213  WBC 12.8* 12.1* 10.4 9.7  HGB 9.9* 9.6* 9.6* 9.6*  HCT 30.5* 29.2* 29.2* 29.6*  MCV 90.5 92.4 92.4 90.8  PLT 161 189 211 381   Basic Metabolic Panel:  Recent Labs Lab 09/20/16 0815 09/21/16 0331 09/22/16 0532 09/23/16 0213 09/24/16 0220  NA 140 140 139 137  --   K 3.8 3.2* 3.8 3.0* 4.1  CL 103 102 103 94*  --   CO2 '31 29 25 '$ 32  --   GLUCOSE 123* 137* 102* 213*  --   BUN '11 8 8 10  '$ --   CREATININE 0.89 0.87 0.85 0.99  --   CALCIUM 8.4* 8.5* 8.5* 8.5*  --   MG  --   --  2.2  --   --    Liver Function Tests: No results for input(s): AST, ALT, ALKPHOS, BILITOT, PROT, ALBUMIN in the last 168 hours. Coagulation Profile:  Recent Labs Lab 09/21/16 0815  INR 1.12   Cardiac Enzymes:  Recent Labs Lab 09/18/16 1100  TROPONINI 0.03*   CBG:  Recent Labs Lab 09/24/16 0655 09/24/16 1116 09/24/16 1625 09/24/16 2046 09/25/16 0648  GLUCAP 180* 164* 160* 210* 188*   Urine analysis:    Component Value Date/Time   COLORURINE YELLOW 09/14/2016 1031   APPEARANCEUR HAZY (A) 09/14/2016 1031   LABSPEC 1.011 09/14/2016 1031   PHURINE 7.0 09/14/2016 1031   GLUCOSEU 100 (A) 09/14/2016 1031   HGBUR LARGE (A) 09/14/2016 1031   BILIRUBINUR NEGATIVE 09/14/2016 1031   KETONESUR NEGATIVE 09/14/2016 1031   PROTEINUR 100 (A) 09/14/2016 1031   UROBILINOGEN 1.0 02/03/2013 0912   NITRITE NEGATIVE 09/14/2016 1031   LEUKOCYTESUR NEGATIVE 09/14/2016 1031   Recent Results (from the past 240 hour(s))  Culture, blood (Routine X 2) w Reflex to ID Panel     Status: None   Collection Time:  09/11/16 11:50 AM  Result Value Ref Range Status   Specimen Description BLOOD RIGHT ANTECUBITAL  Final  Special Requests BOTTLES DRAWN AEROBIC AND ANAEROBIC 5CC  Final   Culture NO GROWTH 5 DAYS  Final   Report Status 09/16/2016 FINAL  Final  Culture, blood (Routine X 2) w Reflex to ID Panel     Status: None   Collection Time: 09/11/16 11:55 AM  Result Value Ref Range Status   Specimen Description BLOOD RIGHT HAND  Final   Special Requests BOTTLES DRAWN AEROBIC AND ANAEROBIC 5CC  Final   Culture NO GROWTH 5 DAYS  Final   Report Status 09/16/2016 FINAL  Final  Urine culture     Status: Abnormal   Collection Time: 09/11/16  3:36 PM  Result Value Ref Range Status   Specimen Description URINE, RANDOM  Final   Special Requests NONE  Final   Culture MULTIPLE SPECIES PRESENT, SUGGEST RECOLLECTION (A)  Final   Report Status 09/12/2016 FINAL  Final  Aerobic/Anaerobic Culture (surgical/deep wound)     Status: None   Collection Time: 09/12/16 12:52 PM  Result Value Ref Range Status   Specimen Description ABSCESS  Final   Special Requests DIVERTICULAR ABSCESS  Final   Gram Stain   Final    MODERATE WBC PRESENT,BOTH PMN AND MONONUCLEAR RARE GRAM NEGATIVE RODS    Culture   Final    RARE KLEBSIELLA PNEUMONIAE MIXED ANAEROBIC FLORA PRESENT.  CALL LAB IF FURTHER IID REQUIRED.    Report Status 09/17/2016 FINAL  Final   Organism ID, Bacteria KLEBSIELLA PNEUMONIAE  Final      Susceptibility   Klebsiella pneumoniae - MIC*    AMPICILLIN 16 RESISTANT Resistant     CEFAZOLIN <=4 SENSITIVE Sensitive     CEFEPIME <=1 SENSITIVE Sensitive     CEFTAZIDIME <=1 SENSITIVE Sensitive     CEFTRIAXONE <=1 SENSITIVE Sensitive     CIPROFLOXACIN <=0.25 SENSITIVE Sensitive     GENTAMICIN <=1 SENSITIVE Sensitive     IMIPENEM <=0.25 SENSITIVE Sensitive     TRIMETH/SULFA <=20 SENSITIVE Sensitive     AMPICILLIN/SULBACTAM <=2 SENSITIVE Sensitive     PIP/TAZO <=4 SENSITIVE Sensitive     Extended ESBL NEGATIVE  Sensitive     * RARE KLEBSIELLA PNEUMONIAE  Surgical pcr screen     Status: None   Collection Time: 09/14/16  8:48 AM  Result Value Ref Range Status   MRSA, PCR NEGATIVE NEGATIVE Final   Staphylococcus aureus NEGATIVE NEGATIVE Final  MRSA PCR Screening     Status: None   Collection Time: 09/14/16  9:03 AM  Result Value Ref Range Status   MRSA by PCR NEGATIVE NEGATIVE Final  Culture, blood (Routine X 2) w Reflex to ID Panel     Status: None (Preliminary result)   Collection Time: 09/14/16 10:38 AM  Result Value Ref Range Status   Specimen Description BLOOD RIGHT ARM  Final   Special Requests BOTTLES DRAWN AEROBIC AND ANAEROBIC 5CC EACH  Final   Culture NO GROWTH 4 DAYS  Final   Report Status PENDING  Incomplete  Culture, blood (Routine X 2) w Reflex to ID Panel     Status: None (Preliminary result)   Collection Time: 09/14/16 10:48 AM  Result Value Ref Range Status   Specimen Description BLOOD RIGHT ARM  Final   Special Requests IN PEDIATRIC BOTTLE 3CC  Final   Culture NO GROWTH 4 DAYS  Final   Report Status PENDING  Incomplete     Radiology Studies:  Ir Sinus/fist Tube Chk-non Gi Result Date: 09/19/2016 INDICATION:  Status post drain injection demonstrating no fistula  or residual large pelvic collection.    Scheduled Meds: . amoxicillin-clavulanate  1 tablet Oral Q12H  . aspirin  81 mg Oral Daily  . famotidine  20 mg Oral Daily  . feeding supplement  1 Container Oral BID BM  . heparin  5,000 Units Subcutaneous Q8H  . insulin aspart  0-15 Units Subcutaneous TID WC  . insulin aspart  0-5 Units Subcutaneous QHS  . ketorolac  1 drop Left Eye QID  . lisinopril  2.5 mg Oral BID  . mouth rinse  15 mL Mouth Rinse BID  . metoprolol tartrate  37.5 mg Oral BID  . potassium chloride (KCL MULTIRUN) 30 mEq in 265 mL IVPB  30 mEq Intravenous Once  . sodium chloride flush  10-40 mL Intracatheter Q12H  . sodium chloride flush  3 mL Intravenous Q12H   Continuous Infusions:   LOS: 13  days   Signature  Lala Lund K M.D on 09/25/2016 at 10:59 AM  Between 7am to 7pm - Pager - [phone], After 7pm go to [email or web address] - password [facility]  [facility]  - Office  [phone]

## 2016-09-25 NOTE — Progress Notes (Signed)
11 Days Post-Op  Subjective: Pt doing well this AM Tol PO   Objective: Vital signs in last 24 hours: Temp:  [97.8 F (36.6 C)-98.1 F (36.7 C)] 97.8 F (36.6 C) (12/03 0459) Pulse Rate:  [83-97] 95 (12/03 0459) Resp:  [18-20] 20 (12/03 0459) BP: (108-125)/(45-52) 125/52 (12/03 0459) SpO2:  [94 %-97 %] 97 % (12/03 0535) Weight:  [72.3 kg (159 lb 6.4 oz)] 72.3 kg (159 lb 6.4 oz) (12/03 0459) Last BM Date: 09/23/16  Intake/Output from previous day: 12/02 0701 - 12/03 0700 In: 683 [P.O.:680; I.V.:3] Out: 75 [Urine:75] Intake/Output this shift: No intake/output data recorded.  General appearance: alert and cooperative GI: soft, non-tender; bowel sounds normal; no masses,  no organomegaly and drain SS  Lab Results:   Recent Labs  09/23/16 0213  WBC 9.7  HGB 9.6*  HCT 29.6*  PLT 243   BMET  Recent Labs  09/23/16 0213 09/24/16 0220  NA 137  --   K 3.0* 4.1  CL 94*  --   CO2 32  --   GLUCOSE 213*  --   BUN 10  --   CREATININE 0.99  --   CALCIUM 8.5*  --    PT/INR No results for input(s): LABPROT, INR in the last 72 hours. ABG No results for input(s): PHART, HCO3 in the last 72 hours.  Invalid input(s): PCO2, PO2  Studies/Results: No results found.  Anti-infectives: Anti-infectives    Start     Dose/Rate Route Frequency Ordered Stop   09/24/16 0000  amoxicillin-clavulanate (AUGMENTIN) 875-125 MG tablet     1 tablet Oral Every 12 hours 09/24/16 1043     09/23/16 1400  amoxicillin-clavulanate (AUGMENTIN) 875-125 MG per tablet 1 tablet     1 tablet Oral Every 12 hours 09/23/16 0928 09/30/16 0959   09/21/16 1400  Ampicillin-Sulbactam (UNASYN) 3 g in sodium chloride 0.9 % 100 mL IVPB  Status:  Discontinued     3 g 200 mL/hr over 30 Minutes Intravenous Every 6 hours 09/21/16 1348 09/23/16 0928   09/15/16 1000  anidulafungin (ERAXIS) 100 mg in sodium chloride 0.9 % 100 mL IVPB  Status:  Discontinued     100 mg over 90 Minutes Intravenous Every 24 hours  09/14/16 0906 09/15/16 1411   09/14/16 1000  anidulafungin (ERAXIS) 200 mg in sodium chloride 0.9 % 200 mL IVPB     200 mg over 180 Minutes Intravenous  Once 09/14/16 0841 09/14/16 2255   09/14/16 0800  vancomycin (VANCOCIN) IVPB 1000 mg/200 mL premix  Status:  Discontinued     1,000 mg 200 mL/hr over 60 Minutes Intravenous Every 12 hours 09/14/16 0737 09/16/16 0903   09/11/16 2200  piperacillin-tazobactam (ZOSYN) IVPB 3.375 g  Status:  Discontinued     3.375 g 12.5 mL/hr over 240 Minutes Intravenous Every 8 hours 09/11/16 1840 09/21/16 1348   09/11/16 1400  piperacillin-tazobactam (ZOSYN) IVPB 3.375 g     3.375 g 100 mL/hr over 30 Minutes Intravenous  Once 09/11/16 1346 09/11/16 1502      Assessment/Plan: Diverticulitis with abscess -IR drain (#2) , cont abx (unasyn due to sensitivity)- can change to po abx per hospitalists, will likely need repeat scan at some point depending on how drain output goes and injection, this can likely be early next week given appearance of drain output or as outpt if DC'd - still think she will resolve without surgery especially given recent arrest and comorbidities - will need colonoscopy at some point also Cardiac arrest/CHF:  per cardiology ID - unasyn, can change to oral abx plan for 5d more VTE - lovenox FEN - low fiber diet  LOS: 13 days    Rosario Jacks., [facility] 09/25/2016

## 2016-09-26 ENCOUNTER — Encounter (HOSPITAL_COMMUNITY): Payer: Self-pay | Admitting: Radiology

## 2016-09-26 ENCOUNTER — Encounter (HOSPITAL_COMMUNITY): Payer: Self-pay

## 2016-09-26 ENCOUNTER — Inpatient Hospital Stay (HOSPITAL_COMMUNITY): Payer: Medicare Other

## 2016-09-26 DIAGNOSIS — I639 Cerebral infarction, unspecified: Secondary | ICD-10-CM

## 2016-09-26 LAB — LIPID PANEL
Cholesterol: 135 mg/dL (ref 0–200)
HDL: 24 mg/dL — AB (ref >40)
LDL CALC: 69 mg/dL (ref 0–99)
TRIGLYCERIDES: 208 mg/dL — AB (ref <150)
Total CHOL/HDL Ratio: 5.6 RATIO
VLDL: 42 mg/dL — AB (ref 0–40)

## 2016-09-26 LAB — AEROBIC/ANAEROBIC CULTURE W GRAM STAIN (SURGICAL/DEEP WOUND)

## 2016-09-26 LAB — GLUCOSE, CAPILLARY
GLUCOSE-CAPILLARY: 199 mg/dL — AB (ref 65–99)
GLUCOSE-CAPILLARY: 130 mg/dL — AB (ref 65–99)
Glucose-Capillary: 191 mg/dL — ABNORMAL HIGH (ref 65–99)
Glucose-Capillary: 167 mg/dL — ABNORMAL HIGH (ref 65–99)

## 2016-09-26 LAB — AEROBIC/ANAEROBIC CULTURE (SURGICAL/DEEP WOUND)

## 2016-09-26 MED ORDER — IOPAMIDOL (ISOVUE-370) INJECTION 76%
INTRAVENOUS | Status: AC
  Administered 2016-09-26: 50 mL
  Filled 2016-09-26: qty 50

## 2016-09-26 MED ORDER — CLOPIDOGREL BISULFATE 75 MG PO TABS
75.0000 mg | ORAL_TABLET | Freq: Every day | ORAL | Status: DC
  Administered 2016-09-27: 75 mg via ORAL
  Filled 2016-09-26 (×2): qty 1

## 2016-09-26 MED ORDER — FUROSEMIDE 40 MG PO TABS: 40.0000 mg | ORAL_TABLET | Freq: Once | ORAL | Status: DC

## 2016-09-26 MED ORDER — FUROSEMIDE 40 MG PO TABS
40.0000 mg | ORAL_TABLET | Freq: Every day | ORAL | Status: DC
  Administered 2016-09-26 – 2016-09-27 (×2): 40 mg via ORAL
  Filled 2016-09-26 (×2): qty 1

## 2016-09-26 NOTE — Progress Notes (Signed)
Patient ID: [name], female   DOB: Nov 15, 1945, 70 y.o.   MRN: [id number]  Pt will hear from IR drain clinic for follow up time and date

## 2016-09-26 NOTE — Consult Note (Signed)
   [facility] CM Inpatient Consult   09/26/2016  [name] Jul 09, 1946 [id number]   [facility] Care Management follow up. Spoke with inpatient RNCM who indicates the discharge plan is now for SNF. It appears it will be [facility]. Will refer to [employer] Licensed CSW for follow up with [name] at [facility]. Will also alert [facility] RNCM of discharge plan.    Marthenia Rolling, MSN-Ed, RN,BSN [facility] Liaison [phone]

## 2016-09-26 NOTE — Progress Notes (Signed)
Nutrition Follow-up  DOCUMENTATION CODES:   Obesity unspecified  INTERVENTION:    Continue Boost Breeze po BID, each supplement provides 250 kcal and 9 grams of protein  NUTRITION DIAGNOSIS:   Inadequate oral intake related to altered GI function as evidenced by per patient/family report  Improved  GOAL:   Patient will meet greater than or equal to 90% of their needs  Progressing  MONITOR:   PO intake, Supplement acceptance, Diet advancement, Labs, Weight trends, Skin, I & O's  ASSESSMENT:   70 year old female with a past medical history significant for nonobstructive CAD (cath 2006 and again in 2013 with normal LV function, mild, non-obstructive CAD noted), HTN, DM 2, HLD, COPD, former smoker, Hx of non-small cell lung cater s/p chemo and radiation, hx of diverticulitis who initially presented on 11/19 with LLQ pain and found to have diverticulitis with abscess formation. Drain placed. 11/22 am when into cardiac arrest. Intubated and transferred to the ICU.   Pt s/p procedure 11/27: IR CM INJ ANY COLONIC TUBE W/FLUORO for diverticulitis of colon  Pt extubated on 11/23. Currently on a Soft diet. PO intake averaging at 50% per flowsheets. Receiving Boost Breeze oral nutrition supplements. CBG's [phone].  Diet Order:  DIET SOFT Room service appropriate? Yes; Fluid consistency: Thin  Skin:  Reviewed, no issues  Last BM:  12/3  Height:   Ht Readings from Last 1 Encounters:  09/14/16 '5\' 2"'$  (1.575 m)    Weight:   Wt Readings from Last 1 Encounters:  09/26/16 155 lb 3.2 oz (70.4 kg)    Ideal Body Weight:  50 kg  BMI:  Body mass index is 28.39 kg/m.  Estimated Nutritional Needs:   Kcal:  1700-1900  Protein:  85-100 grams  Fluid:  1.7-1.9 L  EDUCATION NEEDS:   No education needs identified at this time  Arthur Holms, RD, LDN Pager #: [phone] After-Hours Pager #: [phone]

## 2016-09-26 NOTE — Care Management Important Message (Signed)
Important Message  Patient Details  Name: [name] MRN: [id number] Date of Birth: 04-Jan-1946   Medicare Important Message Given:  Yes    Merlie Noga Abena 09/26/2016, 11:20 AM

## 2016-09-26 NOTE — Progress Notes (Signed)
[facility] Progress Note  12 Days Post-Op  Subjective: Pt doing well. No complaints. No abdominal pain, nausea, vomiting. Tolerating PO well.   Objective: Vital signs in last 24 hours: Temp:  [97.6 F (36.4 C)-98.3 F (36.8 C)] 97.6 F (36.4 C) (12/04 0443) Pulse Rate:  [89-107] 93 (12/04 0443) Resp:  [18-20] 20 (12/04 0443) BP: (115-130)/(35-61) 115/52 (12/04 0443) SpO2:  [93 %-94 %] 94 % (12/04 0443) Weight:  [155 lb 3.2 oz (70.4 kg)] 155 lb 3.2 oz (70.4 kg) (12/04 0443) Last BM Date: 09/25/16  Intake/Output from previous day: 12/03 0701 - 12/04 0700 In: 503 [P.O.:500; I.V.:3] Out: 1055 [Urine:1050; Drains:5] Intake/Output this shift: Total I/O In: 240 [P.O.:240] Out: 100 [Urine:100]  PE: Gen:  Alert, NAD, pleasant Card:  Tachycardic, S1 and S2 normal, 2+ radial pulses bilaterally Pulm: effort normal Abd: Soft, NT/ND, +BS, drain with minimal sanguinous drainage Ext:  No edema noted, normal ROM  Lab Results:  No results for input(s): WBC, HGB, HCT, PLT in the last 72 hours. BMET  Recent Labs  09/24/16 0220  K 4.1   PT/INR No results for input(s): LABPROT, INR in the last 72 hours. CMP     Component Value Date/Time   NA 137 09/23/2016 0213   NA 141 01/15/2015 0848   K 4.1 09/24/2016 0220   K 4.0 01/15/2015 0848   CL 94 (L) 09/23/2016 0213   CL 105 02/18/2013 0812   CO2 32 09/23/2016 0213   CO2 25 01/15/2015 0848   GLUCOSE 213 (H) 09/23/2016 0213   GLUCOSE 99 01/15/2015 0848   GLUCOSE 143 (H) 02/18/2013 0812   BUN 10 09/23/2016 0213   BUN 9.0 01/15/2015 0848   CREATININE 0.99 09/23/2016 0213   CREATININE 0.7 01/15/2015 0848   CALCIUM 8.5 (L) 09/23/2016 0213   CALCIUM 9.2 01/15/2015 0848   PROT 5.2 (L) 09/12/2016 0422   PROT 7.1 01/15/2015 0848   ALBUMIN 2.3 (L) 09/16/2016 0434   ALBUMIN 3.7 01/15/2015 0848   AST 14 (L) 09/12/2016 0422   AST 19 01/15/2015 0848   ALT 13 (L) 09/12/2016 0422   ALT 20 01/15/2015 0848   ALKPHOS 67  09/12/2016 0422   ALKPHOS 92 01/15/2015 0848   BILITOT 0.7 09/12/2016 0422   BILITOT 0.34 01/15/2015 0848   GFRNONAA 57 (L) 09/23/2016 0213   GFRAA >60 09/23/2016 0213   Lipase     Component Value Date/Time   LIPASE 29 09/11/2016 1030       Studies/Results: Ct Head Wo Contrast  Result Date: 09/25/2016 CLINICAL DATA:  Patient complains of LEFT-sided weakness. History of diabetes. Mild cognitive impairment. History of brain radiation. Recent drain placement into a diverticular abscess. EXAM: CT HEAD WITHOUT CONTRAST TECHNIQUE: Contiguous axial images were obtained from the base of the skull through the vertex without intravenous contrast. COMPARISON:  06/14/2016. FINDINGS: Brain: No evidence for acute infarction, hemorrhage, mass lesion, hydrocephalus, or extra-axial fluid. Moderate cerebral and cerebellar atrophy. Extensive hypoattenuation of white matter likely representing a combination of post treatment effect and small vessel disease. No areas of vasogenic edema. Vascular: No hyperdense vessel or unexpected calcification. Skull: Normal. Negative for fracture or focal lesion. Sinuses/Orbits: No acute finding. Other: None. IMPRESSION: Atrophy and small vessel disease.  No acute intracranial findings. Electronically Signed   By: Staci Righter M.D.   On: 09/25/2016 09:55   Mr Brain Wo Contrast  Result Date: 09/25/2016 CLINICAL DATA:  Left arm numbness EXAM: MRI HEAD WITHOUT CONTRAST TECHNIQUE: Multiplanar, multiecho pulse  sequences of the brain and surrounding structures were obtained without intravenous contrast. COMPARISON:  Head CT from earlier today. FINDINGS: Brain: Restricted diffusion in the subcortical left parietal lobe consistent with acute infarct. No definite explanation for left arm numbness. No hemorrhage, hydrocephalus, or mass. Confluent cerebral white matter T2 and FLAIR hyperintensity. Generalized atrophy, progressed from 2015 and greater than expected for age Vascular: There  is loss of expected flow void in the left vertebral artery at the V3 segment. Normal flow void in the intradural segment but smaller than on comparison scan. Skull and upper cervical spine: No focal lesion. Sinuses/Orbits: Bilateral cataract resection. IMPRESSION: 1. 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 2. 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 Generalized atrophy has progressed from 2015. 3. Abnormal appearance of the left vertebral artery suggesting slow flow or occlusion in the neck, new from 2015. Electronically Signed   By: Monte Fantasia M.D.   On: 09/25/2016 16:06    Anti-infectives: Anti-infectives    Start     Dose/Rate Route Frequency Ordered Stop   09/24/16 0000  amoxicillin-clavulanate (AUGMENTIN) 875-125 MG tablet     1 tablet Oral Every 12 hours 09/24/16 1043     09/23/16 1400  amoxicillin-clavulanate (AUGMENTIN) 875-125 MG per tablet 1 tablet     1 tablet Oral Every 12 hours 09/23/16 0928 09/30/16 0959   09/21/16 1400  Ampicillin-Sulbactam (UNASYN) 3 g in sodium chloride 0.9 % 100 mL IVPB  Status:  Discontinued     3 g 200 mL/hr over 30 Minutes Intravenous Every 6 hours 09/21/16 1348 09/23/16 0928   09/15/16 1000  anidulafungin (ERAXIS) 100 mg in sodium chloride 0.9 % 100 mL IVPB  Status:  Discontinued     100 mg over 90 Minutes Intravenous Every 24 hours 09/14/16 0906 09/15/16 1411   09/14/16 1000  anidulafungin (ERAXIS) 200 mg in sodium chloride 0.9 % 200 mL IVPB     200 mg over 180 Minutes Intravenous  Once 09/14/16 0841 09/14/16 2255   09/14/16 0800  vancomycin (VANCOCIN) IVPB 1000 mg/200 mL premix  Status:  Discontinued     1,000 mg 200 mL/hr over 60 Minutes Intravenous Every 12 hours 09/14/16 0737 09/16/16 0903   09/11/16 2200  piperacillin-tazobactam (ZOSYN) IVPB 3.375 g  Status:  Discontinued     3.375 g 12.5 mL/hr over 240 Minutes Intravenous Every 8 hours 09/11/16 1840 09/21/16 1348    09/11/16 1400  piperacillin-tazobactam (ZOSYN) IVPB 3.375 g     3.375 g 100 mL/hr over 30 Minutes Intravenous  Once 09/11/16 1346 09/11/16 1502       Assessment/Plan Diverticulitis with abscess -IR drain (#2) , unasyn d/c'd, PO augmentin, repeat Ct scan as outpt - drain with scant discharge, IR following - colonoscopy as outpt  Cardiac arrest/CHF: per cardiology ID - unasyn, can change to oral abx plan for 5d more per primary VTE - lovenox FEN - low fiber diet  Plan: appears that pt will be d/c'd to SNF today, follow up in drain clinic next wk with CT scan, pt is clear for discharge from our standpoint with f/u with Dr. Ninfa Linden in 2 weeks   LOS: 14 days    Kalman Drape , [facility] 09/26/2016, 8:06 AM Pager: [phone] Consults: [phone] Mon-Fri 7:00 am-4:30 pm Sat-Sun 7:00 am-11:30 am

## 2016-09-26 NOTE — Progress Notes (Signed)
Speech Language Pathology Treatment: Dysphagia  Patient Details Name: [name] MRN: [id number] DOB: 1945/11/29 Today's Date: 09/26/2016 Time: 0952-1010 SLP Time Calculation (min) (ACUTE ONLY): 18 min  Assessment / Plan / Recommendation Clinical Impression  Pt has a baseline, intermittent cough that is noted still throughout PO intake although not immediately following PO trials. She remains afebrile despite drinking thin liquids, and she continues to adamantly refuse any thickener. Recommend to continue current diet for now. She would benefit from at least brief f/u at SNF upon d/c for tolerance.   HPI HPI: 70 y.o.female with hx diverticulitis, CAD, COPD, DM, HTN, hx NSCLC in 2012, initially presented 11/19 with abd pain. Initial w/u revealed diverticulitis with abscess. She was seen by surgery, started on zosyn and had IR drain placed 11/20. She was improving slowly until 11/22 when she was found to be in SVT 190's, then subsequently had PEA arrest with total 6 mins CPR, Defib x 2 for VFib. Intubated during code, awake post arrest but remained hypotensive. Extubated 11/23 and then found to have severe cardiomyopathy.      SLP Plan  Continue with current plan of care     Recommendations  Diet recommendations: Dysphagia 3 (mechanical soft);Thin liquid Liquids provided via: Cup;Straw Medication Administration: Whole meds with puree Supervision: Intermittent supervision to cue for compensatory strategies Compensations: Small sips/bites;Slow rate;Minimize environmental distractions                Oral Care Recommendations: Oral care BID Follow up Recommendations: Skilled Nursing facility Plan: Continue with current plan of care       GO                Germain Osgood 09/26/2016, 10:19 AM  Germain Osgood, M.A. CCC-SLP [phone]

## 2016-09-26 NOTE — Consult Note (Signed)
Neurology Consultation Reason for Consult: Acute CVA Referring Physician: Dr. Candiss Norse  CC: numbness with finding of small, acute infarct of left parietal lobe  History is obtained from: Patient, [name]  HPI: [name] is a 70 y.o. female with PMHx of diverticulitis, CAD, COPD, T2DM, GERD, HTN, h/o stage III NSCLC treated with chemoradiation and prophylactic cranial irradiation in 2012, HLD, and h/o tobacco abuse who presented to the ED initially on 09/11/16 with complaint of abdominal pain and was found to have recurrent diverticulitis with abscess. Patient was started on Zosyn. 3 days after admission, patient went into Torsades then VFib/VTach cardiac arrest and obtained ROSC in 6-8 minutes. Patient went for PCI which showed mild nonobstructive CAD and Takotsubo cardiomyopathy. Echo showed EF of 30-35%, akinesis of mid-apicalateroseptal myocardium and grade 1 diastolic dysfunction. Since admission, patient has continued treatement for diverticulitis with IR drain placement and antibiotics. On the morning of 09/25/16 patient developed left-sided tingling numbness in fingers. On exam by the Hospitalist, no focal neuro deficits were noted, no pronator drift, no facial droop, no dysarthria. CT head was obtained and unremarkable for acute stroke. MRI was then obtained which revealed a small acute infarct in the subcortical left parietal lobe and abnormal appearance of the left vertebral artery suggesting slow flow or occlusion in the neck, new from 2015. Carotid ultrasound was ordered and patient was switched from Aspirin to Plavix.   Patient was seen and examined. She states that yesterday morning around 0900 patient developed left shoulder and left chest pain along with tingling numbness in her left 2nd and 3rd fingers. Then numbness and tingling lasted for a few minutes and resolved. It did not occur again. She has never had numbness before. Her shoulder and chest pain are now resolved as well.  She denied any associated vision or hearing changes, headache, lightheadedness, trouble swallowing or speaking, weakness, or numbness located elsewhere.  LKW: 09/25/16 at 0900 tpa given?: No Premorbid modified rankin scale: 0  ROS: A 14 point ROS was performed and is negative except as noted in the HPI.   Past Medical History:  Diagnosis Date  . Age-related nuclear cataract of both eyes 2016   +cortical age related cataracts OU   . Allergy    seasonal  . Arthritis    hands  . Bilateral diabetic retinopathy (HCC) 2015   Dr. Zigmund Daniel  . Blood transfusion without reported diagnosis    when taking chemo  . CAD (coronary artery disease)    NON obstructive. Cath 2006 preserved LV fxn, scattered irregularities without critical stenosis. 2008 stress echo negative for ischemia, but with hypertensive response  . COPD (chronic obstructive pulmonary disease) ([place])   . Diabetes mellitus with complication (HCC)    diab retinopathy OU (laser)  . Diverticulitis 05/2016  . GERD (gastroesophageal reflux disease)    protonix  . History of diverticulitis of colon   . Hyperlipidemia   . Hypertension   . Lung cancer ([place])    non-small cell lung ca, stage III in 05/2011; systemic chemotherapy concurrent with radiation followed by prophylactic cranial irradiation and has been observation since July of 2010 with no evidence for disease recurrence-released from onc f/u 12/2014 (needs annual cxr by PCP).  CXR 08/2015 stable.  . Mild cognitive impairment with memory loss    Likely from brain radiation therapy  . Open-angle glaucoma 2016   Dr. Shirley Muscat, (bilateral)---responding to topical therapy  . Osteopenia 03/2016   03/2016 DEXA T-score -2.2    Family History  Problem Relation Age of Onset  . Heart failure Mother   . Kidney disease Mother     renal failure  . Diabetes Mother   . Diabetes Father   . Diabetes Brother   . Heart disease Brother   . Heart attack Brother   . Heart attack Brother   .  Heart attack Brother   . Colon cancer Neg Hx    Social History:  reports that she quit smoking about 7 years ago. Her smoking use included Cigarettes. She has a 45.00 pack-year smoking history. She has never used smokeless tobacco. She reports that she does not drink alcohol or use drugs.  Exam: Current vital signs: BP (!) 115/52 (BP Location: Right Arm)   Pulse 93   Temp 97.6 F (36.4 C) (Oral)   Resp 20   Ht '5\' 2"'$  (1.575 m)   Wt 155 lb 3.2 oz (70.4 kg)   SpO2 94%   BMI 28.39 kg/m  Vital signs in last 24 hours: Temp:  [97.6 F (36.4 C)-98.3 F (36.8 C)] 97.6 F (36.4 C) (12/04 0443) Pulse Rate:  [89-97] 93 (12/04 0443) Resp:  [18-20] 20 (12/04 0443) BP: (115-127)/(35-52) 115/52 (12/04 0443) SpO2:  [93 %-94 %] 94 % (12/04 0443) Weight:  [155 lb 3.2 oz (70.4 kg)] 155 lb 3.2 oz (70.4 kg) (12/04 0443)  Physical Exam  Constitutional: Appears chronically ill, in no acute distress.  Psych: Affect appropriate to situation Eyes: No scleral injection HENT: PERRL, ecchymoses on neck, no carotid bruit Head: Normocephalic.  Cardiovascular: Normal rate and regular rhythm.  Respiratory: Effort normal, coarse breath sounds throughout GI: Soft.  No distension. Drain in place.   Skin: Scattered ecchymoses  Neuro: Mental Status: Patient is awake, alert, oriented to person, place, month, year, and situation. Patient is able to give a clear and coherent history. No signs of aphasia or neglect. Cranial Nerves: II: Visual Fields are full. Pupils are equal, round, and reactive to light.  III,IV, VI: EOMI without ptosis or diploplia. Horizontal nystagmus V: Facial sensation is symmetric to temperature VII: Facial movement is symmetric.  VIII: hearing is intact to voice X: Uvula elevates symmetrically XI: Shoulder shrug is symmetric. XII: tongue is midline without atrophy or fasciculations.  Motor: Tone is normal. Bulk is normal. 5/5 strength was present in all four extremities.   Sensory: Sensation is symmetric to light touch and temperature in the arms and legs. Deep Tendon Reflexes: 2+ and symmetric in the triceps and patellae.  Plantars: Toes are downgoing bilaterally.  Cerebellar: HKS are intact bilaterally  I have reviewed labs in epic and the results pertinent to this consultation are: EKG: LBBB, Normal Sinus Rhythm Lipid Panel: Total cholesterol 135, TG 208, HDL 24, LDL 69 HgbA1c: 7.1 on 09/11/16 Potassium 4.1  I have reviewed the images obtained: 1. CT Head wo contrast 09/25/16 showed atrophy and small vessel disease. No acute intracranial findings. 2. MRI Brain wo contrast 09/25/16 showed small acute infarct in the subcortical left parietal lobe. 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 Generalized atrophy has progressed from 2015. Abnormal appearance of the left vertebral artery suggesting slow flow or occlusion in the neck, new from 2015.  Impression: Acute Small Subcortical Left Parietal Infarct likely incidental and embolic from recent cardiac arrest resuscitation   Recommendations: 1) Continue Plavix 75 mg QD 2) No need for high intensity statin given LDL <70 3) CT Angio Head/Neck w/wo contrast pending given possible left vertebral artery occlusion  4) Continue PT/OT/Speech Therapy 5) Cancelled Carotid US   Martyn Malay, DO PGY-3 Internal Medicine Resident Pager # [phone] 09/26/2016 10:31 AM

## 2016-09-26 NOTE — Progress Notes (Signed)
Physical Therapy Treatment Patient Details Name: [name] MRN: [id number] DOB: December 26, 1945 Today's Date: 09/26/2016    History of Present Illness [name] an 70 y.o.femalepast medical history of diverticulitis back in August 2017 who presents today with left lower quadrant pain that started the day prior to admission CT scan of the abdomen pelvis showed a sigmoid: Abscess 4 x 2 cm with a small fossae of pneumoperitoneum in the area.S/p IR drain of abcess 11/20.    PT Comments    Pt con't to have DOE with mobility but is functioning at min guard level. Pt eager to go to rehab to regain indep mobility.  Follow Up Recommendations  SNF;Supervision/Assistance - 24 hour     Equipment Recommendations  None recommended by PT    Recommendations for Other Services       Precautions / Restrictions Precautions Precautions: Fall Restrictions Weight Bearing Restrictions: No    Mobility  Bed Mobility Overal bed mobility: Needs Assistance Bed Mobility: Supine to Sit;Sit to Supine     Supine to sit: Modified independent (Device/Increase time) Sit to supine: Independent   General bed mobility comments: increased time, use of bed rail but no external assist  Transfers Overall transfer level: Needs assistance Equipment used: Rolling walker (2 wheeled) Transfers: Sit to/from Stand Sit to Stand: Min guard Stand pivot transfers: Min guard       General transfer comment: pt transfered to/from bsc x 2, min guard for safety due to urgency  Ambulation/Gait Ambulation/Gait assistance: Min guard Ambulation Distance (Feet): 120 Feet Assistive device: Rolling walker (2 wheeled) Gait Pattern/deviations: Step-through pattern Gait velocity: slow Gait velocity interpretation: Below normal speed for age/gender General Gait Details: 1 seated rest break, + SOB, SPO2 at >95% on 2 LO2 via    Stairs            Wheelchair Mobility    Modified Rankin (Stroke Patients  Only)       Balance Overall balance assessment: Needs assistance         Standing balance support: No upper extremity supported Standing balance-Leahy Scale: Fair                      Cognition Arousal/Alertness: Awake/alert Behavior During Therapy: WFL for tasks assessed/performed Overall Cognitive Status: Within Functional Limits for tasks assessed                      Exercises      General Comments General comments (skin integrity, edema, etc.): pt with jp drain site with dressing intact      Pertinent Vitals/Pain Pain Assessment: No/denies pain    Home Living                      Prior Function            PT Goals (current goals can now be found in the care plan section) Acute Rehab PT Goals Patient Stated Goal: leave for rehab tomorrow Progress towards PT goals: Progressing toward goals    Frequency    Min 3X/week      PT Plan Current plan remains appropriate    Co-evaluation             End of Session Equipment Utilized During Treatment: Gait belt Activity Tolerance: Patient limited by fatigue Patient left: in bed;with call bell/phone within reach;with bed alarm set     Time: 1537-1600 PT Time Calculation (min) (ACUTE ONLY): 23 min  Charges:  $Gait Training: 8-22 mins $Therapeutic Activity: 8-22 mins                    G Codes:      [name] Oct 14, 2016, 4:15 PM   [name], PT, DPT Pager #: [phone] Office #: [phone]

## 2016-09-26 NOTE — Progress Notes (Signed)
PROGRESS NOTE    [name]  DXI:[id number] DOB: Oct 16, 1946 DOA: 09/11/2016   PCP: [name], MD   Brief Narrative:   70 y.o. female with hx diverticulitis, CAD, COPD, DM, HTN, hx NSCLC in 2012, initially presented 11/19 with abd pain.  Initial w/u revealed diverticulitis with abscess.  She was seen by surgery, started on zosyn and had IR drain placed 11/20.  She was improving slowly until 11/22 when she was found to be in SVT 190's, then subsequently had PEA arrest with total 6 mins CPR, Defib x 2 for VFib. Intubated during code, awake post arrest but remained hypotensive.   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  Assessment & Plan:   Diverticular abscess -  Had intra-abdominal drain placed by IR on 09/12/2016 and removed on 09/19/2016, she is on IV antibiotics starting date 09/11/2016, was transitioned to Unasyn on 09/21/2016 and to Augmentin on 09/23/2016, repeat imaging shows reaccumulation of abscess and she had another drain placement on 09/21/2016. Surgery and IR following , she is clinically better tolerating soft diet, now agreeable to SNF and will be discharged to SNF once bed is arranged. We will discharge her on oral Augmentin with outpatient surgery and IR follow-up. Outpatient colonoscopy by Dr. Ardis Hughs.   Morning of 09/25/2016 and left-sided tingling numbness lasting a few minutes. She was examined within 10 minutes by me. No focal deficits, no pronator drift, no facial droop, no dysarthria. Her symptoms had resolved within 10-15 minutes. She has no focal deficits or speech/swallowing problems. No new PT-OT-speech evaluation needed. Symptoms had resolved, Head CT unremarkable. Recent echo noted, MRI did show a left-sided small ischemic stroke which was acute but likely incidental. Have ordered carotid ultrasound, Neuro consulted.   Further workup  per neuro, for now switched from aspirin to Plavix. LDL under 70. Continue present insulin regimen.   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 With mild acute on chronic systolic CHF. - total 6 mins CPR, Defib x 2 for VFib, intubated during code.  Cards saw the patient, she underwent left heart catheterization on 09/14/2016 with clean coronaries, presentation suspicious for Takotsubo cardiac myopathy. Cards and EP recommend continuing beta blocker and Ace.   She developed some shortness of breath and rales on 09/22/2016 and had responded well to Lasix which will be continued orally continue to monitor electrolytes, repeat echo in 6-8 weeks   Acute respiratory failure ([place])  - s/p polymorphic V Tach arrest, extubated 11/23, improved,  continue to wean O2 as tolerated  Mild acute on chronic systolic heart failure EF 25% with shortness of breath on 09/21/2016. Diagnosed clinically, much better after couple of doses of Lasix. Now compensated off oxygen and not short of breath.   Hypokalemia - supplemented will monitor  Acute kidney injury  - resolved    Hypertension, essential  - On lisinopril and metoprolol and BP reasonably controlled     COPD - stable, nebs  PRN  Chronic right-sided lung opacity. On CT scan consistent with previous radiation-related changes, no productive cough or fevers, will monitor.  DM (diabetes mellitus), type 2, uncontrolled with long term use insulin ([place]) - on SSI now - on insulin 70/30 and metformin at home but held here until oral intake improves  CBG (last 3)   Recent Labs  09/25/16 1610  09/25/16 2200 09/26/16 0627  GLUCAP 142* 161* 199*    Lab Results  Component Value Date   HGBA1C 7.1 (H) 09/11/2016     DVT prophylaxis: Hep SQ Code Status: Full  Family Communication: none at bedside Disposition Plan: ready when consultants clear, also PT eval pending   CONSULTANTS:  CCS  IR  Cards  EP  PROCEDURES:  OETT 7.5 11/22 >> 11/23  R FEM ART LINE 11/22 >> 11/24  R IJ CVL 11/22 >>  OGT 11/22 >> 11/23  FOLEY 11/22 >>    STUDIES:   CT abd/pelvis 11/19: Focal diverticulitis of the mid sigmoid colon with adjacent 2.5 x 4.1 cm abscess and foci of pneumoperitoneum in this area. Small amount of complex free fluid within the pelvis.  CT chest/abd/pelvis 11/22: Radiation fibrosis changes in medial RIGHT upper lobe extending to the superior RIGHT hilum. Additional RIGHT upper lobe and scattered RIGHT lung infiltrates question pneumonia versus less likely asymmetric edema. Small BILATERAL pleural effusions RIGHT greater than LEFT with minimal adjacent compressive atelectasis of the lower lobes. Improved diverticulitis changes with resolution of the previously identified abscess collection adjacent to the sigmoid colon. Fractures of the sternum and the anterior RIGHT third and fifth ribs suspect related to CPR. Small pericardial fusion.  TTE 11/22:  LV EF 30-35% w/ akinesis of mid-apicalanteroseptalmycoardium. Grade 1 diastolic dysfunciton. LA & RA normal in size. RV normal in size & function. Trivial aortic regurg w/o stenosis. Trivial mitral regurg. No pulmonic regurg. Mild tricuspid regurg. Trivial pericardial effusion.   Cath11/22/2017: Severe LV dysfunction (EF roughly 25%) with moderately-severely elevated LVEDP (25 mmHg). Mild nonobstructive CAD with 85% smooth narrowing of L2 small-caliber D1. -- Suggestive of Takotsubo Cardiomyopathy.  MICROBIOLOGY:  Blood Ctx x2 11/19 >>  Urine Ctx 11/19:  Multiple species present  Diverticular Abscess Ctx 11/20:   Klebsiella pneumoniae (resistant Ampicillin)  MRSA PCR 11/22:  Negative  Blood Ctx x2 11/22 >>  ANTIBIOTICS:  Anti-infectives    Start     Dose/Rate Route Frequency Ordered Stop   09/24/16 0000  amoxicillin-clavulanate (AUGMENTIN) 875-125 MG tablet     1 tablet Oral Every 12 hours 09/24/16 1043     09/23/16 1400  amoxicillin-clavulanate (AUGMENTIN) 875-125 MG per tablet 1 tablet     1 tablet Oral Every 12 hours 09/23/16 0928 09/30/16 0959   09/21/16 1400  Ampicillin-Sulbactam (UNASYN) 3 g in sodium chloride 0.9 % 100 mL IVPB  Status:  Discontinued     3 g 200 mL/hr over 30 Minutes Intravenous Every 6 hours 09/21/16 1348 09/23/16 0928   09/15/16 1000  anidulafungin (ERAXIS) 100 mg in sodium chloride 0.9 % 100 mL IVPB  Status:  Discontinued     100 mg over 90 Minutes Intravenous Every 24 hours 09/14/16 0906 09/15/16 1411   09/14/16 1000  anidulafungin (ERAXIS) 200 mg in sodium chloride 0.9 % 200 mL IVPB     200 mg over 180 Minutes Intravenous  Once 09/14/16 0841 09/14/16 2255   09/14/16 0800  vancomycin (VANCOCIN) IVPB 1000 mg/200 mL premix  Status:  Discontinued     1,000 mg 200 mL/hr  over 60 Minutes Intravenous Every 12 hours 09/14/16 0737 09/16/16 0903   09/11/16 2200  piperacillin-tazobactam (ZOSYN) IVPB 3.375 g  Status:  Discontinued     3.375 g 12.5 mL/hr over 240 Minutes Intravenous Every 8 hours 09/11/16 1840 09/21/16 1348   09/11/16 1400  piperacillin-tazobactam (ZOSYN) IVPB 3.375 g     3.375 g 100 mL/hr over 30 Minutes Intravenous  Once 09/11/16 1346 09/11/16 1502      Subjective:  Patient sitting in chair, feels better today, shortness of breath has resolved, no chest or abdominal pain, mild pressure around the site of abdominal drain. Early this morning she had some left-sided tingling numbness lasting few minutes which has completely resolved.  Objective: Vitals:   09/25/16 1018 09/25/16 1252 09/25/16 2108 09/26/16 0443  BP: 130/61 (!) 127/46 (!) 122/35 (!) 115/52   Pulse: (!) 107 89 97 93  Resp:  '18 18 20  '$ Temp:  98 F (36.7 C) 98.3 F (36.8 C) 97.6 F (36.4 C)  TempSrc:  Oral Oral Oral  SpO2:  93% 94% 94%  Weight:    70.4 kg (155 lb 3.2 oz)  Height:        Intake/Output Summary (Last 24 hours) at 09/26/16 0917 Last data filed at 09/26/16 0756  Gross per 24 hour  Intake              540 ml  Output             1050 ml  Net             -510 ml   Filed Weights   09/24/16 0322 09/25/16 0459 09/26/16 0443  Weight: 72.4 kg (159 lb 9.8 oz) 72.3 kg (159 lb 6.4 oz) 70.4 kg (155 lb 3.2 oz)    Examination:  General exam: Appears calm and comfortable, AAOx3, no distress today  Respiratory system: faint basilar crackles. Cardiovascular system: S1 & S2 heard, RRR.  Gastrointestinal system: Abdomen is nondistended, soft, Non tender  Central nervous system: Alert and oriented. No focal neurological deficits. Extremities: Symmetric 5 x 5 power. Skin: No rashes, lesions or ulcers  Data Reviewed: I have personally reviewed following labs and imaging studies  CBC:  Recent Labs Lab 09/20/16 0815 09/21/16 0331 09/22/16 0532 09/23/16 0213  WBC 12.8* 12.1* 10.4 9.7  HGB 9.9* 9.6* 9.6* 9.6*  HCT 30.5* 29.2* 29.2* 29.6*  MCV 90.5 92.4 92.4 90.8  PLT 161 189 211 144   Basic Metabolic Panel:  Recent Labs Lab 09/20/16 0815 09/21/16 0331 09/22/16 0532 09/23/16 0213 09/24/16 0220  NA 140 140 139 137  --   K 3.8 3.2* 3.8 3.0* 4.1  CL 103 102 103 94*  --   CO2 '31 29 25 '$ 32  --   GLUCOSE 123* 137* 102* 213*  --   BUN '11 8 8 10  '$ --   CREATININE 0.89 0.87 0.85 0.99  --   CALCIUM 8.4* 8.5* 8.5* 8.5*  --   MG  --   --  2.2  --   --    Liver Function Tests: No results for input(s): AST, ALT, ALKPHOS, BILITOT, PROT, ALBUMIN in the last 168 hours. Coagulation Profile:  Recent Labs Lab 09/21/16 0815  INR 1.12   Cardiac Enzymes: No results for input(s): CKTOTAL, CKMB, CKMBINDEX, TROPONINI in the last 168 hours. CBG:  Recent Labs Lab  09/25/16 0648 09/25/16 1112 09/25/16 1610 09/25/16 2200 09/26/16 0627  GLUCAP 188* 197* 142* 161* 199*   Urine analysis:  Component Value Date/Time   COLORURINE YELLOW 09/14/2016 1031   APPEARANCEUR HAZY (A) 09/14/2016 1031   LABSPEC 1.011 09/14/2016 1031   PHURINE 7.0 09/14/2016 1031   GLUCOSEU 100 (A) 09/14/2016 1031   HGBUR LARGE (A) 09/14/2016 1031   BILIRUBINUR NEGATIVE 09/14/2016 1031   KETONESUR NEGATIVE 09/14/2016 1031   PROTEINUR 100 (A) 09/14/2016 1031   UROBILINOGEN 1.0 02/03/2013 0912   NITRITE NEGATIVE 09/14/2016 1031   LEUKOCYTESUR NEGATIVE 09/14/2016 1031   Recent Results (from the past 240 hour(s))  Culture, blood (Routine X 2) w Reflex to ID Panel     Status: None   Collection Time: 09/11/16 11:50 AM  Result Value Ref Range Status   Specimen Description BLOOD RIGHT ANTECUBITAL  Final   Special Requests BOTTLES DRAWN AEROBIC AND ANAEROBIC 5CC  Final   Culture NO GROWTH 5 DAYS  Final   Report Status 09/16/2016 FINAL  Final  Culture, blood (Routine X 2) w Reflex to ID Panel     Status: None   Collection Time: 09/11/16 11:55 AM  Result Value Ref Range Status   Specimen Description BLOOD RIGHT HAND  Final   Special Requests BOTTLES DRAWN AEROBIC AND ANAEROBIC 5CC  Final   Culture NO GROWTH 5 DAYS  Final   Report Status 09/16/2016 FINAL  Final  Urine culture     Status: Abnormal   Collection Time: 09/11/16  3:36 PM  Result Value Ref Range Status   Specimen Description URINE, RANDOM  Final   Special Requests NONE  Final   Culture MULTIPLE SPECIES PRESENT, SUGGEST RECOLLECTION (A)  Final   Report Status 09/12/2016 FINAL  Final  Aerobic/Anaerobic Culture (surgical/deep wound)     Status: None   Collection Time: 09/12/16 12:52 PM  Result Value Ref Range Status   Specimen Description ABSCESS  Final   Special Requests DIVERTICULAR ABSCESS  Final   Gram Stain   Final    MODERATE WBC PRESENT,BOTH PMN AND MONONUCLEAR RARE GRAM NEGATIVE RODS    Culture    Final    RARE KLEBSIELLA PNEUMONIAE MIXED ANAEROBIC FLORA PRESENT.  CALL LAB IF FURTHER IID REQUIRED.    Report Status 09/17/2016 FINAL  Final   Organism ID, Bacteria KLEBSIELLA PNEUMONIAE  Final      Susceptibility   Klebsiella pneumoniae - MIC*    AMPICILLIN 16 RESISTANT Resistant     CEFAZOLIN <=4 SENSITIVE Sensitive     CEFEPIME <=1 SENSITIVE Sensitive     CEFTAZIDIME <=1 SENSITIVE Sensitive     CEFTRIAXONE <=1 SENSITIVE Sensitive     CIPROFLOXACIN <=0.25 SENSITIVE Sensitive     GENTAMICIN <=1 SENSITIVE Sensitive     IMIPENEM <=0.25 SENSITIVE Sensitive     TRIMETH/SULFA <=20 SENSITIVE Sensitive     AMPICILLIN/SULBACTAM <=2 SENSITIVE Sensitive     PIP/TAZO <=4 SENSITIVE Sensitive     Extended ESBL NEGATIVE Sensitive     * RARE KLEBSIELLA PNEUMONIAE  Surgical pcr screen     Status: None   Collection Time: 09/14/16  8:48 AM  Result Value Ref Range Status   MRSA, PCR NEGATIVE NEGATIVE Final   Staphylococcus aureus NEGATIVE NEGATIVE Final  MRSA PCR Screening     Status: None   Collection Time: 09/14/16  9:03 AM  Result Value Ref Range Status   MRSA by PCR NEGATIVE NEGATIVE Final  Culture, blood (Routine X 2) w Reflex to ID Panel     Status: None (Preliminary result)   Collection Time: 09/14/16 10:38 AM  Result Value Ref Range Status  Specimen Description BLOOD RIGHT ARM  Final   Special Requests BOTTLES DRAWN AEROBIC AND ANAEROBIC 5CC EACH  Final   Culture NO GROWTH 4 DAYS  Final   Report Status PENDING  Incomplete  Culture, blood (Routine X 2) w Reflex to ID Panel     Status: None (Preliminary result)   Collection Time: 09/14/16 10:48 AM  Result Value Ref Range Status   Specimen Description BLOOD RIGHT ARM  Final   Special Requests IN PEDIATRIC BOTTLE 3CC  Final   Culture NO GROWTH 4 DAYS  Final   Report Status PENDING  Incomplete     Radiology Studies:  Ir Sinus/fist Tube Chk-non Gi Result Date: 09/19/2016 INDICATION:  Status post drain injection demonstrating no  fistula or residual large pelvic collection.    Scheduled Meds: . amoxicillin-clavulanate  1 tablet Oral Q12H  . clopidogrel  75 mg Oral Daily  . famotidine  20 mg Oral Daily  . feeding supplement  1 Container Oral BID BM  . heparin  5,000 Units Subcutaneous Q8H  . insulin aspart  0-15 Units Subcutaneous TID WC  . insulin aspart  0-5 Units Subcutaneous QHS  . ketorolac  1 drop Left Eye QID  . lisinopril  2.5 mg Oral BID  . mouth rinse  15 mL Mouth Rinse BID  . metoprolol tartrate  37.5 mg Oral BID  . potassium chloride (KCL MULTIRUN) 30 mEq in 265 mL IVPB  30 mEq Intravenous Once  . sodium chloride flush  10-40 mL Intracatheter Q12H  . sodium chloride flush  3 mL Intravenous Q12H   Continuous Infusions:   LOS: 14 days   Signature  Lala Lund K M.D on 09/26/2016 at 9:17 AM  Between 7am to 7pm - Pager - [phone], After 7pm go to [email or web address] - password [facility]  [facility]  - Office  [phone]

## 2016-09-27 DIAGNOSIS — I25119 Atherosclerotic heart disease of native coronary artery with unspecified angina pectoris: Secondary | ICD-10-CM | POA: Diagnosis not present

## 2016-09-27 DIAGNOSIS — E119 Type 2 diabetes mellitus without complications: Secondary | ICD-10-CM | POA: Diagnosis not present

## 2016-09-27 DIAGNOSIS — J449 Chronic obstructive pulmonary disease, unspecified: Secondary | ICD-10-CM | POA: Diagnosis not present

## 2016-09-27 DIAGNOSIS — K5732 Diverticulitis of large intestine without perforation or abscess without bleeding: Secondary | ICD-10-CM | POA: Diagnosis not present

## 2016-09-27 DIAGNOSIS — I63412 Cerebral infarction due to embolism of left middle cerebral artery: Secondary | ICD-10-CM | POA: Diagnosis not present

## 2016-09-27 DIAGNOSIS — I5020 Unspecified systolic (congestive) heart failure: Secondary | ICD-10-CM | POA: Diagnosis not present

## 2016-09-27 DIAGNOSIS — R05 Cough: Secondary | ICD-10-CM | POA: Diagnosis not present

## 2016-09-27 DIAGNOSIS — K5781 Diverticulitis of intestine, part unspecified, with perforation and abscess with bleeding: Secondary | ICD-10-CM | POA: Diagnosis not present

## 2016-09-27 DIAGNOSIS — I4901 Ventricular fibrillation: Secondary | ICD-10-CM | POA: Diagnosis not present

## 2016-09-27 DIAGNOSIS — I472 Ventricular tachycardia: Secondary | ICD-10-CM | POA: Diagnosis not present

## 2016-09-27 DIAGNOSIS — J9601 Acute respiratory failure with hypoxia: Secondary | ICD-10-CM | POA: Diagnosis not present

## 2016-09-27 DIAGNOSIS — I151 Hypertension secondary to other renal disorders: Secondary | ICD-10-CM | POA: Diagnosis not present

## 2016-09-27 DIAGNOSIS — I5023 Acute on chronic systolic (congestive) heart failure: Secondary | ICD-10-CM | POA: Diagnosis not present

## 2016-09-27 DIAGNOSIS — C3490 Malignant neoplasm of unspecified part of unspecified bronchus or lung: Secondary | ICD-10-CM | POA: Diagnosis not present

## 2016-09-27 DIAGNOSIS — R2689 Other abnormalities of gait and mobility: Secondary | ICD-10-CM | POA: Diagnosis not present

## 2016-09-27 DIAGNOSIS — I10 Essential (primary) hypertension: Secondary | ICD-10-CM | POA: Diagnosis not present

## 2016-09-27 DIAGNOSIS — I469 Cardiac arrest, cause unspecified: Secondary | ICD-10-CM | POA: Diagnosis not present

## 2016-09-27 DIAGNOSIS — I2510 Atherosclerotic heart disease of native coronary artery without angina pectoris: Secondary | ICD-10-CM | POA: Diagnosis not present

## 2016-09-27 DIAGNOSIS — I6340 Cerebral infarction due to embolism of unspecified cerebral artery: Secondary | ICD-10-CM | POA: Diagnosis not present

## 2016-09-27 DIAGNOSIS — K5720 Diverticulitis of large intestine with perforation and abscess without bleeding: Secondary | ICD-10-CM | POA: Diagnosis not present

## 2016-09-27 DIAGNOSIS — R531 Weakness: Secondary | ICD-10-CM | POA: Diagnosis not present

## 2016-09-27 DIAGNOSIS — R1312 Dysphagia, oropharyngeal phase: Secondary | ICD-10-CM | POA: Diagnosis not present

## 2016-09-27 DIAGNOSIS — J9600 Acute respiratory failure, unspecified whether with hypoxia or hypercapnia: Secondary | ICD-10-CM | POA: Diagnosis not present

## 2016-09-27 DIAGNOSIS — I679 Cerebrovascular disease, unspecified: Secondary | ICD-10-CM | POA: Diagnosis not present

## 2016-09-27 DIAGNOSIS — K5780 Diverticulitis of intestine, part unspecified, with perforation and abscess without bleeding: Secondary | ICD-10-CM | POA: Diagnosis not present

## 2016-09-27 DIAGNOSIS — M6281 Muscle weakness (generalized): Secondary | ICD-10-CM | POA: Diagnosis not present

## 2016-09-27 LAB — BASIC METABOLIC PANEL
ANION GAP: 7 (ref 5–15)
BUN: 12 mg/dL (ref 6–20)
CALCIUM: 9.1 mg/dL (ref 8.9–10.3)
CO2: 29 mmol/L (ref 22–32)
Chloride: 100 mmol/L — ABNORMAL LOW (ref 101–111)
Creatinine, Ser: 0.9 mg/dL (ref 0.44–1.00)
Glucose, Bld: 157 mg/dL — ABNORMAL HIGH (ref 65–99)
Potassium: 3.9 mmol/L (ref 3.5–5.1)
SODIUM: 136 mmol/L (ref 135–145)

## 2016-09-27 LAB — GLUCOSE, CAPILLARY
GLUCOSE-CAPILLARY: 84 mg/dL (ref 65–99)
Glucose-Capillary: 192 mg/dL — ABNORMAL HIGH (ref 65–99)
Glucose-Capillary: 316 mg/dL — ABNORMAL HIGH (ref 65–99)

## 2016-09-27 LAB — CBC
HEMATOCRIT: 31.4 % — AB (ref 36.0–46.0)
Hemoglobin: 10.3 g/dL — ABNORMAL LOW (ref 12.0–15.0)
MCH: 29.7 pg (ref 26.0–34.0)
MCHC: 32.8 g/dL (ref 30.0–36.0)
MCV: 90.5 fL (ref 78.0–100.0)
Platelets: 314 10*3/uL (ref 150–400)
RBC: 3.47 MIL/uL — ABNORMAL LOW (ref 3.87–5.11)
RDW: 15.6 % — AB (ref 11.5–15.5)
WBC: 10.3 10*3/uL (ref 4.0–10.5)

## 2016-09-27 MED ORDER — POTASSIUM CHLORIDE ER 10 MEQ PO TBCR: 10.0000 meq | EXTENDED_RELEASE_TABLET | Freq: Every day | ORAL | Status: DC

## 2016-09-27 MED ORDER — FUROSEMIDE 40 MG PO TABS: 40.0000 mg | ORAL_TABLET | Freq: Every day | ORAL | Status: DC

## 2016-09-27 MED ORDER — CLOPIDOGREL BISULFATE 75 MG PO TABS: 75.0000 mg | ORAL_TABLET | Freq: Every day | ORAL | Status: DC

## 2016-09-27 MED ORDER — AMOXICILLIN-POT CLAVULANATE 875-125 MG PO TABS: 1.0000 | ORAL_TABLET | Freq: Two times a day (BID) | ORAL | Status: DC

## 2016-09-27 NOTE — Progress Notes (Signed)
Clinical Social Worker facilitated patient discharge including contacting patient family and facility to confirm patient discharge plans.  Clinical information faxed to facility and family agreeable with plan.  CSW arranged ambulance transport via [place] after hours to [facility] skilled nursing facility .  RN [place] has already contacted [facility] for report prior to discharge.  Clinical Social Worker will sign off for now as social work intervention is no longer needed. Please consult [place] again if new need arises.  Rhea Pink, [name], [place]

## 2016-09-27 NOTE — Evaluation (Signed)
Occupational Therapy Evaluation Patient Details Name: [name] MRN: [id number] DOB: 12-Dec-1945 Today's Date: 09/27/2016    History of Present Illness [name] an 70 y.o.femalepast medical history of diverticulitis back in August 2017 who presents today with left lower quadrant pain that started the day prior to admission CT scan of the abdomen pelvis showed a sigmoid: Abscess 4 x 2 cm with a small fossae of pneumoperitoneum in the area.S/p IR drain of abcess 11/20.   Clinical Impression   Pt with decline in function and safety with ADLs and ADL mobility. Pt is at set up - min A level with ADLs and ADL mobility. Pt fatigues easily with SOB demonstrated, however O2 SATs >90%. Pt planning to d/c to [facility] later today or tomorrow morning. All education completed and no further acute OT is indicated at this time;  pt eager to d/c to SNF rehab. Deferring further OT intervention to SNF    Follow Up Recommendations  SNF    Equipment Recommendations  Other (comment);None recommended by OT (TBD at next venue of care)    Recommendations for Other Services       Precautions / Restrictions Precautions Precautions: Fall Restrictions Weight Bearing Restrictions: No      Mobility Bed Mobility               General bed mobility comments: pt up in recliner  Transfers Overall transfer level: Needs assistance Equipment used: Rolling walker (2 wheeled);None Transfers: Sit to/from [employer] to Stand: Min guard Stand pivot transfers: Min guard            Balance Overall balance assessment: Needs assistance   Sitting balance-Leahy Scale: Good       Standing balance-Leahy Scale: Fair                              ADL Overall ADL's : Needs assistance/impaired     Grooming: Wash/dry hands;Wash/dry face;Standing;Min guard   Upper Body Bathing: Set up;Sitting   Lower Body Bathing: Minimal assistance   Upper  Body Dressing : Set up;Sitting   Lower Body Dressing: Minimal assistance;Moderate assistance   Toilet Transfer: BSC;Min guard   Toileting- Clothing Manipulation and Hygiene: Min guard;Sit to/from stand       Functional mobility during ADLs: Min guard       Vision Vision Assessment?: No apparent visual deficits              Pertinent Vitals/Pain Pain Assessment: No/denies pain     Hand Dominance Right   Extremity/Trunk Assessment Upper Extremity Assessment Upper Extremity Assessment: Generalized weakness   Lower Extremity Assessment Lower Extremity Assessment: Defer to PT evaluation       Communication Communication Communication: No difficulties   Cognition Arousal/Alertness: Awake/alert   Overall Cognitive Status: Within Functional Limits for tasks assessed                     General Comments   pt pleasant and cooperative, eager to begin SNF therapy                 Home Living Family/patient expects to be discharged to:: Skilled nursing facility Living Arrangements: Spouse/significant other Available Help at Discharge: Family;Available 24 hours/day Type of Home: House Home Access: Stairs to enter [employer] of Steps: 4 Entrance Stairs-Rails: Right Home Layout: One level     Bathroom Shower/Tub: Tub/shower unit  Bathroom Toilet: Standard     Home Equipment: Environmental consultant - 2 wheels;Shower seat   Additional Comments: pt's spouse recently diagnosed with cancer and undergoing radiation and is on home O2  Lives With: Family    Prior Functioning/Environment Level of Independence: Independent        Comments: pt doesn't drive and has recently been using cane        OT Problem List: Impaired balance (sitting and/or standing);Decreased activity tolerance;Decreased knowledge of use of DME or AE;Cardiopulmonary status limiting activity   OT Treatment/Interventions:      OT Goals(Current goals can be found in the care plan  section) Acute Rehab OT Goals Patient Stated Goal: go to rehab then go home OT Goal Formulation: With patient  OT Frequency:     Barriers to D/C: Decreased caregiver support                        End of Session Equipment Utilized During Treatment: Gait belt;Other (comment) (BSC)  Activity Tolerance: Patient tolerated treatment well Patient left: in chair;with call bell/phone within reach   Time: 8828-0034 OT Time Calculation (min): 21 min Charges:  OT General Charges $OT Visit: 1 Procedure OT Evaluation $OT Eval Moderate Complexity: 1 Procedure G-Codes:    Britt Bottom 09/27/2016, 2:22 PM

## 2016-09-27 NOTE — Progress Notes (Signed)
STROKE TEAM PROGRESS NOTE   HISTORY OF PRESENT ILLNESS (per record) [name] is a 70 y.o. female with PMHx of diverticulitis, CAD, COPD, T2DM, GERD, HTN, h/o stage III NSCLC treated with chemoradiation and prophylactic cranial irradiation in 2012, HLD, and h/o tobacco abuse who presented to the ED initially on 09/11/16 with complaint of abdominal pain and was found to have recurrent diverticulitis with abscess. Patient was started on Zosyn. 3 days after admission, patient went into [place] then VFib/VTach cardiac arrest and obtained ROSC in 6-8 minutes. Patient went for PCI which showed mild nonobstructive CAD and Takotsubo cardiomyopathy. Echo showed EF of 30-35%, akinesis of mid-apicalateroseptal myocardium and grade 1 diastolic dysfunction. Since admission, patient has continued treatement for diverticulitis with IR drain placement and antibiotics. On the morning of 09/25/16 patient developed left-sided tingling numbness in fingers. On exam by the Hospitalist, no focal neuro deficits were noted, no pronator drift, no facial droop, no dysarthria. CT head was obtained and unremarkable for acute stroke. MRI was then obtained which revealed a small acute infarct in the subcortical left parietal lobe and abnormal appearance of the left vertebral artery suggesting slow flow or occlusion in the neck, new from 2015. Carotid ultrasound was ordered and patient was switched from Aspirin to Plavix.   Patient was seen and examined. She states that yesterday morning around 0900 patient developed left shoulder and left chest pain along with tingling numbness in her left 2nd and 3rd fingers. Then numbness and tingling lasted for a few minutes and resolved. It did not occur again. She has never had numbness before. Her shoulder and chest pain are now resolved as well. She denied any associated vision or hearing changes, headache, lightheadedness, trouble swallowing or speaking, weakness, or numbness located  elsewhere. Premorbid modified rankin scale: 0.  She was 09/25/16 at 0900. Patient was not administered IV t-PA.    SUBJECTIVE (INTERVAL HISTORY) No family is at bedside. Patient sitting in chair for back first. Awake alert and no acute distress. Her left sided numbness resolved, likely related to her chest pain at that time. Left punctate infarct seen in the MRI is incidental finding, may related to her recent cardiac cath.   OBJECTIVE Temp:  [97.7 F (36.5 C)-98.1 F (36.7 C)] 97.7 F (36.5 C) (12/05 0640) Pulse Rate:  [93-97] 95 (12/05 0640) Cardiac Rhythm: Sinus tachycardia;Bundle branch block (12/04 2056) Resp:  [18-19] 18 (12/05 0640) BP: (115-133)/(47-56) 115/52 (12/05 0858) SpO2:  [95 %-99 %] 95 % (12/05 0640)  CBC:  Recent Labs Lab 09/23/16 0213 09/27/16 0206  WBC 9.7 10.3  HGB 9.6* 10.3*  HCT 29.6* 31.4*  MCV 90.8 90.5  PLT 243 299    Basic Metabolic Panel:  Recent Labs Lab 09/22/16 0532 09/23/16 0213 09/24/16 0220 09/27/16 0206  NA 139 137  --  136  K 3.8 3.0* 4.1 3.9  CL 103 94*  --  100*  CO2 25 32  --  29  GLUCOSE 102* 213*  --  157*  BUN 8 10  --  12  CREATININE 0.85 0.99  --  0.90  CALCIUM 8.5* 8.5*  --  9.1  MG 2.2  --   --   --     Lipid Panel:    Component Value Date/Time   CHOL 135 09/26/2016 0716   TRIG 208 (H) 09/26/2016 0716   TRIG 327 (HH) 08/15/2006 1601   HDL 24 (L) 09/26/2016 0716   CHOLHDL 5.6 09/26/2016 0716   VLDL 42 (H)  09/26/2016 0716   LDLCALC 69 09/26/2016 0716   HgbA1c:  Lab Results  Component Value Date   HGBA1C 7.1 (H) 09/11/2016   Urine Drug Screen: No results found for: LABOPIA, COCAINSCRNUR, LABBENZ, AMPHETMU, THCU, LABBARB    IMAGING I have personally reviewed the radiological images below and agree with the radiology interpretations.  Ct Angio Head W Or Wo Contrast Ct Angio Neck W Or Wo Contrast 09/26/2016 1. Occlusion of the proximal non dominant left vertebral artery with reconstitution at the C5-6  level. 2. 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 3. Small left parietal lobe infarct is not visualized by CT. 4. Moderate diffuse white matter disease is again seen. 5. Minimal atherosclerotic calcifications at the left carotid bifurcation without significant stenosis. 6. No significant intracranial vascular disease is evident.   Mr Brain Wo Contrast 09/25/2016 1. 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 2. 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 Generalized atrophy has progressed from 2015. 3. Abnormal appearance of the left vertebral artery suggesting slow flow or occlusion in the neck, new from 2015.   2-D echocardiogram - Left ventricle: The cavity size was normal. Wall thickness was normal. Systolic function was moderately to severely reduced. The estimated ejection fraction was in the range of 30% to 35%. Akinesis of the mid-apicalanteroseptal myocardium. Doppler parameters are consistent with abnormal left ventricular relaxation (grade 1 diastolic dysfunction). - Aortic valve: Mildly calcified annulus. There was trivial regurgitation. - Pulmonary arteries: Systolic pressure was mildly increased. PA peak pressure: 38 mm Hg (S). - Pericardium, extracardiac: A trivial pericardial effusion was identified. Impressions:   Technically difficult images.  The LV function is moderately reduced with EF of 30-35%.  There is mid - distal anteroseptal akinesis   PHYSICAL EXAM  Temp:  [97.7 F (36.5 C)-97.9 F (36.6 C)] 97.9 F (36.6 C) (12/05 1225) Pulse Rate:  [91-97] 91 (12/05 1225) Resp:  [18-19] 19 (12/05 1225) BP: (114-133)/(47-52) 114/52 (12/05 1225) SpO2:  [95 %-97 %] 97 % (12/05 1225)  General - Well nourished, well developed, in no apparent distress.  Ophthalmologic - Fundi not visualized due to noncooperation.  Cardiovascular - Regular rate and rhythm.  Mental Status -  Level of  arousal and orientation to time, place, and person were intact. Language including expression, naming, repetition, comprehension was assessed and found intact. Fund of Knowledge was assessed and was intact.  Cranial Nerves II - XII - II - Visual field intact OU. III, IV, VI - Extraocular movements intact. V - Facial sensation intact bilaterally. VII - Facial movement intact bilaterally. VIII - Hearing & vestibular intact bilaterally. X - Palate elevates symmetrically. XI - Chin turning & shoulder shrug intact bilaterally. XII - Tongue protrusion intact.  Motor Strength - The patient's strength was normal in all extremities and pronator drift was absent.  Bulk was normal and fasciculations were absent.   Motor Tone - Muscle tone was assessed at the neck and appendages and was normal.  Reflexes - The patient's reflexes were 1+ in all extremities and she had no pathological reflexes.  Sensory - Light touch, temperature/pinprick were assessed and were symmetrical.    Coordination - The patient had normal movements in the hands with no ataxia or dysmetria.  Tremor was absent.  Gait and Station - deferred   ASSESSMENT/PLAN [name] is a 70 y.o. female with history of diverticulitis, CAD, COPD, T2DM, GERD, HTN, h/o  stage III NSCLC treated with chemoradiation and prophylactic cranial irradiation in 2012, HLD, and h/o tobacco abuse presenting to the ED initially 09/11/2016 with abdominal pain, found to have recurrent diverticulitis with abscess. 3 days following admission went into [place] with V. fib/V. tach cardiac arrest with resuscitation. 09/25/2016 in hospital developed left-sided tingling and numbness in the fingers. MRI showed incidental subcortical left parietal infarct. She did not receive IV t-PA.   Stroke:  Incidental subcortical left parietal infarct not related to patient presentation, likely embolic post cardiac arrest with V Fib/Vtach or post cardiac cath  MRI   punctate subcortical left parietal infarct. Progressive generalized atrophy. Slow flow/occlusion L VA  CTA head and neck occlusion proximal L VA, distal L VA high-grade stenosis, likely chronic or hypoplastic. small vessel disease  2D Echo  EF 30-35%, mid distal anteroseptal akinesis, no obvious SOE  Recommend to repeat 2-D echo in 3 months to evaluate EF  LDL 69  HgbA1c 7.1  Heparin 5000 units sq tid for VTE prophylaxis  DIET SOFT Room service appropriate? Yes; Fluid consistency: Thin  aspirin 81 mg daily prior to admission, now on clopidogrel 75 mg daily. Continue Plavix at discharge  Patient counseled to be compliant with her antithrombotic medications  Ongoing aggressive stroke risk factor management.   Therapy recommendations:  SNF  Disposition:  Plan SNF at [facility]  No neuro followup indicated  Hypertension  Stable  Long-term BP goal normotensive from stroke standpoint  Hyperlipidemia  Home meds:  Lipitor 80 mg daily, resumed in hospital then stopped  LDL 69, goal < 70  Resume Lipitor 80 at discharge  Diabetes  HgbA1c 7.1 , goal < 7.0  Not fully controlled  SSI  CBG monitoring  Other Stroke Risk Factors  Advanced age  Former Cigarette smoker  Overweight, Body mass index is 28.39 kg/m., recommend weight loss, diet and exercise as appropriate   Coronary artery disease - Cardiac arrest/CHF this admission medical treatment  Acute on chronic systolic heart failure, EF 25%, improving  Other Active Problems   Hypokalemia   Acute kidney injury, resolved   COPD    Diverticulitis with abscess, has IR drain and on abx  Known non-small cell lung cancer, stage III in August 2012. No evidence for disease recurrence per oncology since 09/13/2015  Hospital day # 15  Neurology will sign off. Please call with questions. No neuro follow-up needed. Thanks for the consult.   To contact Stroke Continuity provider, please refer to [email or web address] After  hours, contact General Neurology

## 2016-09-27 NOTE — Evaluation (Signed)
Speech Language Pathology Evaluation Patient Details Name: [name] MRN: [id number] DOB: 1946-02-18 Today's Date: 09/27/2016 Time: 0109-3235 SLP Time Calculation (min) (ACUTE ONLY): 11 min  Problem List:  Patient Active Problem List   Diagnosis Date Noted  . Cerebrovascular accident (CVA) ([place])   . Hypokalemia 09/17/2016  . Hypertension associated with chronic kidney disease due to type 2 diabetes mellitus ([place]) 09/17/2016  . Cardiac arrest with ventricular fibrillation ([place]) 09/14/2016  . Takotsubo cardiomyopathy 09/14/2016  . Acute respiratory failure ([place])   . Diverticulitis of large intestine with perforation and abscess 09/11/2016  . Debilitated patient 07/08/2016  . Arterial hypotension   . Controlled type 2 diabetes mellitus with hyperglycemia, with long-term current use of insulin ([place]) 06/14/2016  . Hyperlipidemia 06/14/2016  . Non-occlusive coronary artery disease 06/06/2012  . TOBACCO USE, QUIT 07/29/2009  . DM (diabetes mellitus), type 2, uncontrolled with complications ([place]) 57/32/2025   Past Medical History:  Past Medical History:  Diagnosis Date  . Age-related nuclear cataract of both eyes 2016   +cortical age related cataracts OU   . Allergy    seasonal  . Arthritis    hands  . Bilateral diabetic retinopathy (HCC) 2015   Dr. Zigmund Daniel  . Blood transfusion without reported diagnosis    when taking chemo  . CAD (coronary artery disease)    NON obstructive. Cath 2006 preserved LV fxn, scattered irregularities without critical stenosis. 2008 stress echo negative for ischemia, but with hypertensive response  . COPD (chronic obstructive pulmonary disease) ([place])   . Diabetes mellitus with complication (HCC)    diab retinopathy OU (laser)  . Diverticulitis 05/2016  . GERD (gastroesophageal reflux disease)    protonix  . History of diverticulitis of colon   . Hyperlipidemia   . Hypertension   . Lung cancer ([place])    non-small cell lung ca, stage III in  05/2011; systemic chemotherapy concurrent with radiation followed by prophylactic cranial irradiation and has been observation since July of 2010 with no evidence for disease recurrence-released from onc f/u 12/2014 (needs annual cxr by PCP).  CXR 08/2015 stable.  . Mild cognitive impairment with memory loss    Likely from brain radiation therapy  . Open-angle glaucoma 2016   Dr. Shirley Muscat, (bilateral)---responding to topical therapy  . Osteopenia 03/2016   03/2016 DEXA T-score -2.2   Past Surgical History:  Past Surgical History:  Procedure Laterality Date  . ABDOMINAL HYSTERECTOMY  1997  . APPENDECTOMY    . CARDIAC CATHETERIZATION N/A 09/14/2016   Procedure: Left Heart Cath and Coronary Angiography;  Surgeon: Troy Sine, MD;  Location: [facility];  Service: Cardiovascular;  Laterality: N/A;  . CARPAL TUNNEL RELEASE    . CATARACT EXTRACTION    . CHOLECYSTECTOMY  2002  . COLONOSCOPY  10/24/00   normal.  [employer] hemoccult testing via [place] 06/18/15 was NEG  . IR GENERIC HISTORICAL  09/19/2016   IR SINUS/FIST TUBE CHK-NON GI 09/19/2016 Corrie Mckusick, DO [facility]-INTER[facility] RAD  . LEFT HEART CATHETERIZATION WITH CORONARY ANGIOGRAM N/A 05/30/2012   Procedure: LEFT HEART CATHETERIZATION WITH CORONARY ANGIOGRAM;  Surgeon: Burnell Blanks, MD;  Location: [facility];  Service: Cardiovascular;  Laterality: N/A;   HPI:  70 y.o.female with hx diverticulitis, CAD, COPD, DM, HTN, hx NSCLC in 2012, initially presented 11/19 with abd pain. Initial w/u revealed diverticulitis with abscess. She was seen by surgery, started on zosyn and had IR drain placed 11/20. She was improving slowly until 11/22 when she was  found to be in SVT 190's, then subsequently had PEA arrest with total 6 mins CPR, Defib x 2 for VFib. Intubated during code, awake post arrest but remained hypotensive. Extubated 11/23 and then found to have severe cardiomyopathy./  Dx also includes small, acute infarct of left parietal lobe.    Assessment / Plan / Recommendation Clinical Impression  Pt presents with normal speech/language function s/p small left parietal CVA> output is fluent, normal semantics/grammatical form with no anomia; follows commands.  Mild deficits in working memory and Copy.  Baseline functioning unknown.  No SLP f/u for cognition/language warranted.     SLP Assessment  Patient does not need any further Speech Lanaguage Pathology Services    Follow Up Recommendations     None for communication; needs f/u at snf for swallowing  Frequency and Duration           SLP Evaluation Cognition  Overall Cognitive Status: Within Functional Limits for tasks assessed Arousal/Alertness: Awake/alert Orientation Level: Oriented X4 Attention: Sustained Sustained Attention: Appears intact Memory: Impaired Memory Impairment: Decreased recall of new information Awareness: Appears intact Executive Function: Reasoning Reasoning: Impaired Reasoning Impairment: Verbal complex       Comprehension  Auditory Comprehension Overall Auditory Comprehension: Appears within functional limits for tasks assessed Yes/No Questions: Within Functional Limits Commands: Within Functional Limits Visual Recognition/Discrimination Discrimination: Within Function Limits    Expression Expression Primary Mode of Expression: Verbal Verbal Expression Overall Verbal Expression: Appears within functional limits for tasks assessed   Oral / Motor  Oral Motor/Sensory Function Overall Oral Motor/Sensory Function: Within functional limits Motor Speech Overall Motor Speech: Appears within functional limits for tasks assessed   GO                    [name] 09/27/2016, 2:15 PM   [name] L. Tivis Ringer, [place] CCC/SLP Pager [phone]

## 2016-09-27 NOTE — Progress Notes (Signed)
13 Days Post-Op  Subjective: No abdominal pain  Objective: Vital signs in last 24 hours: Temp:  [97.7 F (36.5 C)-98.1 F (36.7 C)] 97.7 F (36.5 C) (12/05 0640) Pulse Rate:  [93-102] 95 (12/05 0640) Resp:  [18-19] 18 (12/05 0640) BP: (102-133)/(47-56) 121/47 (12/05 0640) SpO2:  [95 %-99 %] 95 % (12/05 0640) Last BM Date: 09/25/16  Intake/Output from previous day: 12/04 0701 - 12/05 0700 In: 848 [P.O.:840; I.V.:3] Out: 705 [Urine:700; Drains:5] Intake/Output this shift: No intake/output data recorded.  General appearance: cooperative Resp: clear to auscultation bilaterally GI: soft, NT, drain in place  Lab Results:   Recent Labs  09/27/16 0206  WBC 10.3  HGB 10.3*  HCT 31.4*  PLT 314   BMET  Recent Labs  09/27/16 0206  NA 136  K 3.9  CL 100*  CO2 29  GLUCOSE 157*  BUN 12  CREATININE 0.90  CALCIUM 9.1   PT/INR No results for input(s): LABPROT, INR in the last 72 hours. ABG No results for input(s): PHART, HCO3 in the last 72 hours.  Invalid input(s): PCO2, PO2  Studies/Results: Ct Angio Head W Or Wo Contrast  Result Date: 09/26/2016 CLINICAL DATA:  Left arm numbness. Abnormal MRI. 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 Next item EXAM: CT ANGIOGRAPHY HEAD AND NECK CT ANGIOGRAPHY HEAD TECHNIQUE: Multidetector CT imaging of the head and neck was performed using the standard protocol during bolus administration of intravenous contrast. Multiplanar CT image reconstructions and MIPs were obtained to evaluate the vascular anatomy. Carotid stenosis measurements (when applicable) are obtained utilizing NASCET criteria, using the distal internal carotid diameter as the denominator. CONTRAST:  50 mL Isovue 3 7 a COMPARISON:  MRI brain 09/25/2016. CT head without contrast 09/25/2016. FINDINGS: CT HEAD FINDINGS Brain: The punctate subcortical left parietal lobe infarct is not visualized by CT. Moderate diffuse periventricular and subcortical white  matter disease is again seen. The basal ganglia are intact. Insular ribbon is normal bilaterally. No acute hemorrhage or mass lesion is present. Vascular: No hyperdense vessel or unexpected calcification. Skull: The calvarium is intact. No focal lytic or blastic lesions are present. Sinuses: The paranasal sinuses are clear. There is some fluid in left mastoid air cells. No obstructing nasopharyngeal lesion is present. Orbits: Bilateral lens replacements are present. The globes and orbits are otherwise within normal limits. Review of the MIP images confirms the above findings CTA NECK FINDINGS Aortic arch: Atherosclerotic calcifications are present at the aortic arch without significant stenosis. There is a common origin of the left common carotid artery in the innominate artery. Right carotid system: The right common carotid artery is within normal limits. The bifurcation is unremarkable. There is minimal tortuosity in the cervical right ICA without significant stenosis. Left carotid system: A left common carotid artery is within normal limits. Minimal calcifications are present at the left carotid bifurcation. Mild tortuosity is present in the cervical left ICA without significant stenosis. Vertebral arteries: The right vertebral artery is dominant. The left vertebral artery origin is not visualized. A hypoplastic vessel is reconstituted at the level of C5-6 on the left with segmental stenosis at C4 and C5. There is focal high-grade stenosis at the C2 level is well. Skeleton: Multilevel degenerative changes in the cervical spine are most pronounced at C4-5 with chronic loss of disc space and slight retrolisthesis. Right greater than left foraminal narrowing is present at C3-4 and C4-5. The patient is edentulous. No focal lytic or blastic lesions are present. Other neck: No focal  mucosal or submucosal lesions are present. There is no significant adenopathy. Salivary glands are within normal limits. 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 Upper chest: Right perihilar and medial upper lobe soft tissue is stable, compatible with prior treatment. Right-sided bronchiectasis is noted. The left lung is clear. Review of the MIP images confirms the above findings CTA HEAD FINDINGS Anterior circulation: The internal carotid arteries are within normal limits from the high cervical segments through the ICA termini bilaterally. There is mild narrowing on the right of less than 50%. The ICA termini are within normal limits bilaterally. The A1 and M1 segments are normal. The left A1 segment is dominant. Anterior communicating artery is patent. MCA bifurcations are intact bilaterally. The ACA and MCA branch vessels are within normal limits. Posterior circulation: The right vertebral artery is the dominant vessel. PICA origins are below the dural margin bilaterally. The vertebrobasilar junction is normal. The basilar artery is small. Both posterior cerebral arteries originate from the basilar tip. The PCA branch vessels are within normal limits. Venous sinuses: The dural sinuses are patent. The right transverse sinus is dominant. The straight sinus and deep cerebral veins are intact. Cortical veins are unremarkable. Anatomic variants: None. Delayed phase: The postcontrast images demonstrate no pathologic enhancement. Review of the MIP images confirms the above findings IMPRESSION: 1. 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 2. 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 3. Small left parietal lobe infarct is not visualized by CT. 4. Moderate diffuse white matter disease is again seen. 5. Minimal atherosclerotic calcifications at the left carotid bifurcation without significant stenosis. 6. No significant intracranial vascular disease is evident. Electronically Signed   By: San Morelle M.D.   On: 09/26/2016 12:13   Ct Head Wo Contrast  Result  Date: 09/25/2016 CLINICAL DATA:  Patient complains of LEFT-sided weakness. History of diabetes. Mild cognitive impairment. History of brain radiation. Recent drain placement into a diverticular abscess. EXAM: CT HEAD WITHOUT CONTRAST TECHNIQUE: Contiguous axial images were obtained from the base of the skull through the vertex without intravenous contrast. COMPARISON:  06/14/2016. FINDINGS: Brain: No evidence for acute infarction, hemorrhage, mass lesion, hydrocephalus, or extra-axial fluid. Moderate cerebral and cerebellar atrophy. Extensive hypoattenuation of white matter likely representing a combination of post treatment effect and small vessel disease. No areas of vasogenic edema. Vascular: No hyperdense vessel or unexpected calcification. Skull: Normal. Negative for fracture or focal lesion. Sinuses/Orbits: No acute finding. Other: None. IMPRESSION: Atrophy and small vessel disease.  No acute intracranial findings. Electronically Signed   By: Staci Righter M.D.   On: 09/25/2016 09:55   Ct Angio Neck W Or Wo Contrast  Result Date: 09/26/2016 CLINICAL DATA:  Left arm numbness. Abnormal MRI. 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 Next item EXAM: CT ANGIOGRAPHY HEAD AND NECK CT ANGIOGRAPHY HEAD TECHNIQUE: Multidetector CT imaging of the head and neck was performed using the standard protocol during bolus administration of intravenous contrast. Multiplanar CT image reconstructions and MIPs were obtained to evaluate the vascular anatomy. Carotid stenosis measurements (when applicable) are obtained utilizing NASCET criteria, using the distal internal carotid diameter as the denominator. CONTRAST:  50 mL Isovue 3 7 a COMPARISON:  MRI brain 09/25/2016. CT head without contrast 09/25/2016. FINDINGS: CT HEAD FINDINGS Brain: The punctate subcortical left parietal lobe infarct is not visualized by CT. Moderate diffuse periventricular and subcortical white matter disease is again seen. The basal  ganglia are intact. Insular ribbon is normal bilaterally. No acute hemorrhage or mass lesion is present. Vascular: No hyperdense vessel or unexpected calcification. Skull: The calvarium is intact. No focal lytic or blastic lesions are present. Sinuses: The paranasal sinuses are clear. There is some fluid in left mastoid air cells. No obstructing nasopharyngeal lesion is present. Orbits: Bilateral lens replacements are present. The globes and orbits are otherwise within normal limits. Review of the MIP images confirms the above findings CTA NECK FINDINGS Aortic arch: Atherosclerotic calcifications are present at the aortic arch without significant stenosis. There is a common origin of the left common carotid artery in the innominate artery. Right carotid system: The right common carotid artery is within normal limits. The bifurcation is unremarkable. There is minimal tortuosity in the cervical right ICA without significant stenosis. Left carotid system: A left common carotid artery is within normal limits. Minimal calcifications are present at the left carotid bifurcation. Mild tortuosity is present in the cervical left ICA without significant stenosis. Vertebral arteries: The right vertebral artery is dominant. The left vertebral artery origin is not visualized. A hypoplastic vessel is reconstituted at the level of C5-6 on the left with segmental stenosis at C4 and C5. There is focal high-grade stenosis at the C2 level is well. Skeleton: Multilevel degenerative changes in the cervical spine are most pronounced at C4-5 with chronic loss of disc space and slight retrolisthesis. Right greater than left foraminal narrowing is present at C3-4 and C4-5. The patient is edentulous. No focal lytic or blastic lesions are present. Other neck: No focal mucosal or submucosal lesions are present. There is no significant adenopathy. Salivary glands are within normal limits. 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 Upper chest: Right  perihilar and medial upper lobe soft tissue is stable, compatible with prior treatment. Right-sided bronchiectasis is noted. The left lung is clear. Review of the MIP images confirms the above findings CTA HEAD FINDINGS Anterior circulation: The internal carotid arteries are within normal limits from the high cervical segments through the ICA termini bilaterally. There is mild narrowing on the right of less than 50%. The ICA termini are within normal limits bilaterally. The A1 and M1 segments are normal. The left A1 segment is dominant. Anterior communicating artery is patent. MCA bifurcations are intact bilaterally. The ACA and MCA branch vessels are within normal limits. Posterior circulation: The right vertebral artery is the dominant vessel. PICA origins are below the dural margin bilaterally. The vertebrobasilar junction is normal. The basilar artery is small. Both posterior cerebral arteries originate from the basilar tip. The PCA branch vessels are within normal limits. Venous sinuses: The dural sinuses are patent. The right transverse sinus is dominant. The straight sinus and deep cerebral veins are intact. Cortical veins are unremarkable. Anatomic variants: None. Delayed phase: The postcontrast images demonstrate no pathologic enhancement. Review of the MIP images confirms the above findings IMPRESSION: 1. 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 2. 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 3. Small left parietal lobe infarct is not visualized by CT. 4. Moderate diffuse white matter disease is again seen. 5. Minimal atherosclerotic calcifications at the left carotid bifurcation without significant stenosis. 6. No significant intracranial vascular disease is evident. Electronically Signed   By: San Morelle M.D.   On: 09/26/2016 12:13   Mr Brain Wo Contrast  Result Date: 09/25/2016 CLINICAL DATA:  Left arm  numbness EXAM: MRI HEAD WITHOUT  CONTRAST TECHNIQUE: Multiplanar, multiecho pulse sequences of the brain and surrounding structures were obtained without intravenous contrast. COMPARISON:  Head CT from earlier today. FINDINGS: Brain: Restricted diffusion in the subcortical left parietal lobe consistent with acute infarct. No definite explanation for left arm numbness. No hemorrhage, hydrocephalus, or mass. Confluent cerebral white matter T2 and FLAIR hyperintensity. Generalized atrophy, progressed from 2015 and greater than expected for age Vascular: There is loss of expected flow void in the left vertebral artery at the V3 segment. Normal flow void in the intradural segment but smaller than on comparison scan. Skull and upper cervical spine: No focal lesion. Sinuses/Orbits: Bilateral cataract resection. IMPRESSION: 1. 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 2. 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 Generalized atrophy has progressed from 2015. 3. Abnormal appearance of the left vertebral artery suggesting slow flow or occlusion in the neck, new from 2015. Electronically Signed   By: Monte Fantasia M.D.   On: 09/25/2016 16:06    Anti-infectives: Anti-infectives    Start     Dose/Rate Route Frequency Ordered Stop   09/24/16 0000  amoxicillin-clavulanate (AUGMENTIN) 875-125 MG tablet     1 tablet Oral Every 12 hours 09/24/16 1043     09/23/16 1400  amoxicillin-clavulanate (AUGMENTIN) 875-125 MG per tablet 1 tablet     1 tablet Oral Every 12 hours 09/23/16 0928 09/30/16 0959   09/21/16 1400  Ampicillin-Sulbactam (UNASYN) 3 g in sodium chloride 0.9 % 100 mL IVPB  Status:  Discontinued     3 g 200 mL/hr over 30 Minutes Intravenous Every 6 hours 09/21/16 1348 09/23/16 0928   09/15/16 1000  anidulafungin (ERAXIS) 100 mg in sodium chloride 0.9 % 100 mL IVPB  Status:  Discontinued     100 mg over 90 Minutes Intravenous Every 24 hours  09/14/16 0906 09/15/16 1411   09/14/16 1000  anidulafungin (ERAXIS) 200 mg in sodium chloride 0.9 % 200 mL IVPB     200 mg over 180 Minutes Intravenous  Once 09/14/16 0841 09/14/16 2255   09/14/16 0800  vancomycin (VANCOCIN) IVPB 1000 mg/200 mL premix  Status:  Discontinued     1,000 mg 200 mL/hr over 60 Minutes Intravenous Every 12 hours 09/14/16 0737 09/16/16 0903   09/11/16 2200  piperacillin-tazobactam (ZOSYN) IVPB 3.375 g  Status:  Discontinued     3.375 g 12.5 mL/hr over 240 Minutes Intravenous Every 8 hours 09/11/16 1840 09/21/16 1348   09/11/16 1400  piperacillin-tazobactam (ZOSYN) IVPB 3.375 g     3.375 g 100 mL/hr over 30 Minutes Intravenous  Once 09/11/16 1346 09/11/16 1502      Assessment/Plan: Diverticulitis with abscess -IR drain (#2) , unasyn d/c'd, PO augmentin, repeat Ct scan as outpt - drain with scant discharge, IR following - colonoscopy as outpt  Cardiac arrest/CHF: per cardiology ID - Augmentin VTE - lovenox FEN - low fiber diet  Plan: F/U with IR drain clinic, F/U at [facility] with Dr. Ninfa Linden 2 weeks  LOS: 15 days    Josephmichael Lisenbee E 09/27/2016

## 2016-09-27 NOTE — Progress Notes (Signed)
Report called to [place] LPN at [facility].

## 2016-09-27 NOTE — Discharge Instructions (Signed)
Follow with Primary MD MCGOWEN,PHILIP H, MD in 7 days   Get CBC, CMP, 2 view Chest X ray checked  by Primary MD or SNF MD in 5-7 days ( we routinely change or add medications that can affect your baseline labs and fluid status, therefore we recommend that you get the mentioned basic workup next visit with your PCP, your PCP may decide not to get them or add new tests based on their clinical decision)   Activity: As tolerated with Full fall precautions use walker/cane & assistance as needed   Disposition SNF   Diet:   DIET SOFT Fluid consistency: Nectar Thick with feeding assistance and aspiration precautions.  For Heart failure patients - Check your Weight same time everyday, if you gain over 2 pounds, or you develop in leg swelling, experience more shortness of breath or chest pain, call your Primary MD immediately. Follow Cardiac Low Salt Diet and 1.5 lit/day fluid restriction.   On your next visit with your primary care physician please Get Medicines reviewed and adjusted.   Please request your Prim.MD to go over all Hospital Tests and Procedure/Radiological results at the follow up, please get all Hospital records sent to your Prim MD by signing hospital release before you go home.   If you experience worsening of your admission symptoms, develop shortness of breath, life threatening emergency, suicidal or homicidal thoughts you must seek medical attention immediately by calling 911 or calling your MD immediately  if symptoms less severe.  You Must read complete instructions/literature along with all the possible adverse reactions/side effects for all the Medicines you take and that have been prescribed to you. Take any new Medicines after you have completely understood and accpet all the possible adverse reactions/side effects.   Do not drive, operate heavy machinery, perform activities at heights, swimming or participation in water activities or provide baby sitting services if your  were admitted for syncope or siezures until you have seen by Primary MD or a Neurologist and advised to do so again.  Do not drive when taking Pain medications.    Do not take more than prescribed Pain, Sleep and Anxiety Medications  Special Instructions: If you have smoked or chewed Tobacco  in the last 2 yrs please stop smoking, stop any regular Alcohol  and or any Recreational drug use.  Wear Seat belts while driving.   Please note  You were cared for by a hospitalist during your hospital stay. If you have any questions about your discharge medications or the care you received while you were in the hospital after you are discharged, you can call the unit and asked to speak with the hospitalist on call if the hospitalist that took care of you is not available. Once you are discharged, your primary care physician will handle any further medical issues. Please note that NO REFILLS for any discharge medications will be authorized once you are discharged, as it is imperative that you return to your primary care physician (or establish a relationship with a primary care physician if you do not have one) for your aftercare needs so that they can reassess your need for medications and monitor your lab values.

## 2016-09-27 NOTE — Progress Notes (Signed)
Patient IV removed, discharge instructions with paperwork  given and [facility] to [facility].

## 2016-09-27 NOTE — Progress Notes (Addendum)
Clinical Social Worker met patient at bedside and updated her on discharge plan. Patient is still agreeable to go to [facility] after discharge. Patients daughter want to drive patient to facility.  12:30pm:  CSW spoke to [place] from [facility] and she stated that she is unable to take patient at this time. CSW tired asking for reasoning behind facility not being able to take patient today and Ivin Booty state "i just cant take her today, but if she waits until tomorrow I can"  CSW will contact other facility to find alternative placement for patient due to patient possibly discharging today 12/5  Rhea Pink, [name],  [place]

## 2016-09-27 NOTE — Progress Notes (Signed)
Assumed care of patient from [place], [place]. I agree with previous RN's assessment.

## 2016-09-28 ENCOUNTER — Other Ambulatory Visit: Payer: Self-pay | Admitting: General Surgery

## 2016-09-28 DIAGNOSIS — K5720 Diverticulitis of large intestine with perforation and abscess without bleeding: Secondary | ICD-10-CM

## 2016-09-29 ENCOUNTER — Encounter: Payer: Self-pay | Admitting: *Deleted

## 2016-09-29 DIAGNOSIS — J9600 Acute respiratory failure, unspecified whether with hypoxia or hypercapnia: Secondary | ICD-10-CM | POA: Diagnosis not present

## 2016-09-29 DIAGNOSIS — I5020 Unspecified systolic (congestive) heart failure: Secondary | ICD-10-CM | POA: Diagnosis not present

## 2016-09-29 DIAGNOSIS — I469 Cardiac arrest, cause unspecified: Secondary | ICD-10-CM | POA: Diagnosis not present

## 2016-09-29 DIAGNOSIS — J449 Chronic obstructive pulmonary disease, unspecified: Secondary | ICD-10-CM | POA: Diagnosis not present

## 2016-09-29 DIAGNOSIS — C3490 Malignant neoplasm of unspecified part of unspecified bronchus or lung: Secondary | ICD-10-CM | POA: Diagnosis not present

## 2016-10-04 ENCOUNTER — Encounter: Payer: Self-pay | Admitting: *Deleted

## 2016-10-04 ENCOUNTER — Other Ambulatory Visit: Payer: Self-pay | Admitting: *Deleted

## 2016-10-04 NOTE — Patient Outreach (Signed)
[facility]) Care Management  10/04/2016  [name] [birth year] [id number]   CSW was able to make contact with patient today to perform the initial assessment, as well as assess and assist with social work needs and services, when [facility] met with patient at [facility] [place] where patient currently resides to receive short-term rehabilitative services.  CSW introduced self, explained role and types of services provided through [facility] ([facility]).  CSW further explained to patient that CSW works with patient's Geriatric Nurse Practitioner, [name], also with [facility]. CSW then explained the reason for the call, indicating that [name] thought that patient would benefit from social work services and resources to assist with discharge planning needs and services from the skilled nursing facility.  CSW obtained two HIPAA compliant identifiers from patient, which included patient's name and date of birth. Patient admits that she plans to return home to live with her husband, [name] at time of release from [facility].  Patient reported that [name] is very supportive of her and able to assist with activities of daily living, when patient is feeling fatigue, weakness and pain.  Patient is agreeable to having CSW work with the discharge planning coordinator at [facility] to arrange home care services and durable medical equipment, prior to patient returning home to live.  CSW will plan to meet with patient for the next routine visit on Tuesday, December 26th, to assess and assist with possible discharge planning needs and services.  [name], [name], [name], [name]  Licensed Education officer, [employer] [facility]  [employer] [address] [place], [place] [address] Physical Address-[address], [place],  [place] [address] Toll Free Main # [phone] Fax # [phone] Cell # [phone]  Fax # [phone]  [name]

## 2016-10-06 ENCOUNTER — Ambulatory Visit
Admission: RE | Admit: 2016-10-06 | Discharge: 2016-10-06 | Disposition: A | Discharge status: Home / Self Care | Payer: Medicare Other | Source: Ambulatory Visit | Attending: General Surgery | Admitting: General Surgery

## 2016-10-06 ENCOUNTER — Ambulatory Visit
Admission: RE | Admit: 2016-10-06 | Discharge: 2016-10-06 | Disposition: A | Discharge status: Home / Self Care | Payer: Medicare Other | Source: Ambulatory Visit | Attending: Radiology | Admitting: Radiology

## 2016-10-06 ENCOUNTER — Ambulatory Visit: Payer: Self-pay | Admitting: Family Medicine

## 2016-10-06 ENCOUNTER — Other Ambulatory Visit: Payer: Self-pay | Admitting: General Surgery

## 2016-10-06 DIAGNOSIS — K5720 Diverticulitis of large intestine with perforation and abscess without bleeding: Secondary | ICD-10-CM

## 2016-10-06 DIAGNOSIS — K5780 Diverticulitis of intestine, part unspecified, with perforation and abscess without bleeding: Secondary | ICD-10-CM | POA: Diagnosis not present

## 2016-10-06 DIAGNOSIS — K5732 Diverticulitis of large intestine without perforation or abscess without bleeding: Secondary | ICD-10-CM | POA: Diagnosis not present

## 2016-10-06 DIAGNOSIS — R05 Cough: Secondary | ICD-10-CM | POA: Diagnosis not present

## 2016-10-06 DIAGNOSIS — J449 Chronic obstructive pulmonary disease, unspecified: Secondary | ICD-10-CM | POA: Diagnosis not present

## 2016-10-06 HISTORY — PX: IR GENERIC HISTORICAL: IMG1180011

## 2016-10-06 MED ORDER — IOPAMIDOL (ISOVUE-300) INJECTION 61%
100.0000 mL | Freq: Once | INTRAVENOUS | Status: AC | PRN
  Administered 2016-10-06: 100 mL via INTRAVENOUS

## 2016-10-06 NOTE — Progress Notes (Signed)
Patient ID: [name], female   DOB: 09/10/1946, 70 y.o.   MRN: [id number]       Chief Complaint: Pelvic diverticular abscess, outpatient follow-up  Referring Physician(s): [name]  History of Present Illness: [name] is a 70 y.o. female with multiple medical problems, status post CT-guided percutaneous drainage of a pelvic diverticular abscess 09/21/2016. She returns for outpatient follow-up with a repeat CT and fluoroscopic drain injection. She has completed antibiotics as an outpatient. No current abdominal pain or pelvic pain. She is tolerating her diet. Stable weight. No interval fevers.  Past Medical History:  Diagnosis Date  . Age-related nuclear cataract of both eyes 2016   +cortical age related cataracts OU   . Allergy    seasonal  . Arthritis    hands  . Bilateral diabetic retinopathy (HCC) 2015   Dr. Zigmund Daniel  . Blood transfusion without reported diagnosis    when taking chemo  . CAD (coronary artery disease)    NON obstructive. Cath 2006 preserved LV fxn, scattered irregularities without critical stenosis. 2008 stress echo negative for ischemia, but with hypertensive response  . COPD (chronic obstructive pulmonary disease) ([place])   . Diabetes mellitus with complication (HCC)    diab retinopathy OU (laser)  . Diverticulitis 05/2016  . GERD (gastroesophageal reflux disease)    protonix  . History of diverticulitis of colon   . Hyperlipidemia   . Hypertension   . Lung cancer ([place])    non-small cell lung ca, stage III in 05/2011; systemic chemotherapy concurrent with radiation followed by prophylactic cranial irradiation and has been observation since July of 2010 with no evidence for disease recurrence-released from onc f/u 12/2014 (needs annual cxr by PCP).  CXR 08/2015 stable.  . Mild cognitive impairment with memory loss    Likely from brain radiation therapy  . Open-angle glaucoma 2016   Dr. Shirley Muscat, (bilateral)---responding to topical therapy  .  Osteopenia 03/2016   03/2016 DEXA T-score -2.2    Past Surgical History:  Procedure Laterality Date  . ABDOMINAL HYSTERECTOMY  1997  . APPENDECTOMY    . CARDIAC CATHETERIZATION N/A 09/14/2016   Procedure: Left Heart Cath and Coronary Angiography;  Surgeon: Troy Sine, MD;  Location: [facility];  Service: Cardiovascular;  Laterality: N/A;  . CARPAL TUNNEL RELEASE    . CATARACT EXTRACTION    . CHOLECYSTECTOMY  2002  . COLONOSCOPY  10/24/00   normal.  [facility] hemoccult testing via [facility] 06/18/15 was NEG  . IR GENERIC HISTORICAL  09/19/2016   IR SINUS/FIST TUBE CHK-NON GI 09/19/2016 Corrie Mckusick, DO [facility]  . LEFT HEART CATHETERIZATION WITH CORONARY ANGIOGRAM N/A 05/30/2012   Procedure: LEFT HEART CATHETERIZATION WITH CORONARY ANGIOGRAM;  Surgeon: Burnell Blanks, MD;  Location: [facility];  Service: Cardiovascular;  Laterality: N/A;    Allergies: Lactose intolerance (gi) and Ibuprofen  Medications: Prior to Admission medications   Medication Sig Start Date End Date Taking? Authorizing Provider  acetaminophen (TYLENOL) 500 MG tablet Take 1,000 mg by mouth every 6 (six) hours as needed for headache (pain).    Historical Provider, MD  albuterol (PROAIR HFA) 108 (90 Base) MCG/ACT inhaler Inhale 2 puffs into the lungs every 6 (six) hours as needed for wheezing or shortness of breath.    Historical Provider, MD  albuterol (PROVENTIL) (2.5 MG/3ML) 0.083% nebulizer solution USE ONE VIAL IN NEBULIZER EVERY 4 HOURS AS NEEDED FOR WHEEZING OR SHORTNESS OF BREATH 12/16/15   [name], MD  amoxicillin-clavulanate (AUGMENTIN) 875-125 MG tablet Take 1 tablet by mouth every 12 (twelve) hours. 5 more days. Must follow with the drain clinic and Gen. surgery before stopping this medication 09/27/16   Thurnell Lose, MD  atorvastatin (LIPITOR) 80 MG tablet TAKE ONE TABLET BY MOUTH ONCE DAILY Patient taking differently: TAKE ONE TABLET BY MOUTH ONCE DAILY AT BEDTIME 07/18/16    [name], MD  Bromfenac Sodium (PROLENSA) 0.07 % SOLN Place 1 drop into the left eye at bedtime.    Historical Provider, MD  clopidogrel (PLAVIX) 75 MG tablet Take 1 tablet (75 mg total) by mouth daily. 09/28/16   Thurnell Lose, MD  feeding supplement (BOOST / RESOURCE BREEZE) LIQD Take 1 Container by mouth 2 (two) times daily between meals. 09/24/16   Thurnell Lose, MD  FLOVENT HFA 220 MCG/ACT inhaler INHALE TWO PUFFS BY MOUTH TWICE DAILY Patient not taking: Reported on 09/11/2016 01/12/16   [name], MD  fluticasone (FLONASE) 50 MCG/ACT nasal spray Place 2 sprays into both nostrils at bedtime. 06/25/14   [name], MD  furosemide (LASIX) 40 MG tablet Take 1 tablet (40 mg total) by mouth daily. 09/28/16   Thurnell Lose, MD  insulin aspart (NOVOLOG) 100 UNIT/ML injection Before each meal 3 times a day, 140-199 - 2 units, 200-250 - 4 units, 251-299 - 6 units,  300-349 - 8 units,  350 or above 10 units. Dispense syringes and needles as needed, Ok to switch to PEN if approved. Substitute to any brand approved. DX DM2, Code E11.65 09/24/16   Thurnell Lose, MD  lisinopril (PRINIVIL,ZESTRIL) 2.5 MG tablet Take 1 tablet (2.5 mg total) by mouth 2 (two) times daily. 09/24/16   Thurnell Lose, MD  loratadine (CLARITIN) 10 MG tablet Take 10 mg by mouth at bedtime.     Historical Provider, MD  Metoprolol Tartrate 37.5 MG TABS Take 37.5 mg by mouth 2 (two) times daily. 09/24/16   Thurnell Lose, MD  nitroGLYCERIN (NITROSTAT) 0.4 MG SL tablet Place 1 tablet (0.4 mg total) under the tongue every 5 (five) minutes as needed. For chest pain Patient taking differently: Place 0.4 mg under the tongue every 5 (five) minutes as needed for chest pain.  12/31/12   Lisabeth Pick, MD  pantoprazole (PROTONIX) 40 MG tablet TAKE ONE TABLET BY MOUTH ONCE DAILY Patient taking differently: TAKE ONE TABLET BY MOUTH ONCE DAILY AT BEDTIME 10/15/15   [name], MD  potassium chloride (K-DUR) 10 MEQ  tablet Take 1 tablet (10 mEq total) by mouth daily. 09/27/16   Thurnell Lose, MD  prednisoLONE acetate (PRED FORTE) 1 % ophthalmic suspension Place 1 drop into the left eye at bedtime.  01/18/16   Historical Provider, MD     Family History  Problem Relation Age of Onset  . Heart failure Mother   . Kidney disease Mother     renal failure  . Diabetes Mother   . Diabetes Father   . Diabetes Brother   . Heart disease Brother   . Heart attack Brother   . Heart attack Brother   . Heart attack Brother   . Colon cancer Neg Hx     Social History   Social History  . Marital status: Married    Spouse name: N/A  . Number of children: N/A  . Years of education: N/A   Occupational History  . Retired Systems analyst Unemployed   Social History Main Topics  .  Smoking status: Former Smoker    Packs/day: 1.00    Years: 45.00    Types: Cigarettes    Quit date: 10/24/2008  . Smokeless tobacco: Never Used  . Alcohol use No  . Drug use: No  . Sexual activity: Not on file   Other Topics Concern  . Not on file   Social History Narrative   Lives in [place], has husband and daughter and son. Daughter's family is living with her and husband at present. She is ambulatory daily without cane or walker.      Review of Systems: A 12 point ROS discussed and pertinent positives are indicated in the HPI above.  All other systems are negative.  Review of Systems  Vital Signs: BP (!) 117/53 (BP Location: Right Arm)   Pulse 86   Temp 97.6 F (36.4 C) (Oral)   SpO2 100%   Physical Exam  Constitutional: She is oriented to person, place, and time.  Frail obese elderly female in no distress.  Eyes: Conjunctivae are normal. No scleral icterus.  Abdominal: Soft. Bowel sounds are normal. She exhibits no distension. There is no tenderness. There is no rebound and no guarding.  Anterior pelvic drain catheter site clean, dry and intact  Neurological: She is alert and oriented to person, place,  and time.  Skin: Skin is warm and dry. No rash noted. No erythema.      Imaging: Ct Angio Head W Or Wo Contrast  Result Date: 09/26/2016 CLINICAL DATA:  Left arm numbness. Abnormal MRI. 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 Next item EXAM: CT ANGIOGRAPHY HEAD AND NECK CT ANGIOGRAPHY HEAD TECHNIQUE: Multidetector CT imaging of the head and neck was performed using the standard protocol during bolus administration of intravenous contrast. Multiplanar CT image reconstructions and MIPs were obtained to evaluate the vascular anatomy. Carotid stenosis measurements (when applicable) are obtained utilizing NASCET criteria, using the distal internal carotid diameter as the denominator. CONTRAST:  50 mL Isovue 3 7 a COMPARISON:  MRI brain 09/25/2016. CT head without contrast 09/25/2016. FINDINGS: CT HEAD FINDINGS Brain: The punctate subcortical left parietal lobe infarct is not visualized by CT. Moderate diffuse periventricular and subcortical white matter disease is again seen. The basal ganglia are intact. Insular ribbon is normal bilaterally. No acute hemorrhage or mass lesion is present. Vascular: No hyperdense vessel or unexpected calcification. Skull: The calvarium is intact. No focal lytic or blastic lesions are present. Sinuses: The paranasal sinuses are clear. There is some fluid in left mastoid air cells. No obstructing nasopharyngeal lesion is present. Orbits: Bilateral lens replacements are present. The globes and orbits are otherwise within normal limits. Review of the MIP images confirms the above findings CTA NECK FINDINGS Aortic arch: Atherosclerotic calcifications are present at the aortic arch without significant stenosis. There is a common origin of the left common carotid artery in the innominate artery. Right carotid system: The right common carotid artery is within normal limits. The bifurcation is unremarkable. There is minimal tortuosity in the cervical right ICA without  significant stenosis. Left carotid system: A left common carotid artery is within normal limits. Minimal calcifications are present at the left carotid bifurcation. Mild tortuosity is present in the cervical left ICA without significant stenosis. Vertebral arteries: The right vertebral artery is dominant. The left vertebral artery origin is not visualized. A hypoplastic vessel is reconstituted at the level of C5-6 on the left with segmental stenosis at C4 and C5. There is focal high-grade stenosis at the C2  level is well. Skeleton: Multilevel degenerative changes in the cervical spine are most pronounced at C4-5 with chronic loss of disc space and slight retrolisthesis. Right greater than left foraminal narrowing is present at C3-4 and C4-5. The patient is edentulous. No focal lytic or blastic lesions are present. Other neck: No focal mucosal or submucosal lesions are present. There is no significant adenopathy. Salivary glands are within normal limits. 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 Upper chest: Right perihilar and medial upper lobe soft tissue is stable, compatible with prior treatment. Right-sided bronchiectasis is noted. The left lung is clear. Review of the MIP images confirms the above findings CTA HEAD FINDINGS Anterior circulation: The internal carotid arteries are within normal limits from the high cervical segments through the ICA termini bilaterally. There is mild narrowing on the right of less than 50%. The ICA termini are within normal limits bilaterally. The A1 and M1 segments are normal. The left A1 segment is dominant. Anterior communicating artery is patent. MCA bifurcations are intact bilaterally. The ACA and MCA branch vessels are within normal limits. Posterior circulation: The right vertebral artery is the dominant vessel. PICA origins are below the dural margin bilaterally. The vertebrobasilar junction is normal. The basilar artery is small. Both posterior cerebral arteries originate from  the basilar tip. The PCA branch vessels are within normal limits. Venous sinuses: The dural sinuses are patent. The right transverse sinus is dominant. The straight sinus and deep cerebral veins are intact. Cortical veins are unremarkable. Anatomic variants: None. Delayed phase: The postcontrast images demonstrate no pathologic enhancement. Review of the MIP images confirms the above findings IMPRESSION: 1. 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 2. 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 3. Small left parietal lobe infarct is not visualized by CT. 4. Moderate diffuse white matter disease is again seen. 5. Minimal atherosclerotic calcifications at the left carotid bifurcation without significant stenosis. 6. No significant intracranial vascular disease is evident. Electronically Signed   By: San Morelle M.D.   On: 09/26/2016 12:13   Dg Chest 2 View  Result Date: 09/21/2016 CLINICAL DATA:  Cough and shortness of Breath EXAM: CHEST  2 VIEW COMPARISON:  09/14/2016 FINDINGS: Cardiac shadow is stable. Endotracheal tube and nasogastric catheter is well as a left jugular catheter have been removed. Right jugular catheter remains. Bilateral small pleural effusions are noted. Likely some underlying atelectasis is present as well. There is persistent right peritracheal density consistent with consolidation similar to that seen on the prior exam. No acute bony abnormality is seen. IMPRESSION: Persistent consolidation in the right upper lobe. Bilateral pleural effusions. Electronically Signed   By: Inez Catalina M.D.   On: 09/21/2016 20:15   X-ray Chest Pa And Lateral  Result Date: 09/12/2016 CLINICAL DATA:  Preop EXAM: CHEST  2 VIEW COMPARISON:  09/07/2016 FINDINGS: Elevated right hemidiaphragm. Right medial upper lobe density again noted, unchanged. Left lung is clear. Heart is normal size. No acute bony abnormality.  IMPRESSION: Stable right medial upper lobe density and elevated right hemidiaphragm. No acute findings. Electronically Signed   By: Rolm Baptise M.D.   On: 09/12/2016 13:25   Dg Chest 2 View  Result Date: 09/08/2016 CLINICAL DATA:  Cough and chest congestion for 2-3 days. History of right-sided lung malignancy, COPD, discontinued smoking in 2010. EXAM: CHEST  2 VIEW COMPARISON:  AP chest x-ray and chest CT scan of June 14, 2016 FINDINGS: There  is chronic volume loss on the right. A persistent abnormal soft tissue density mass is noted in the right upper lobe. There is pleural thickening in the right apex. No blunting of the costophrenic angles on the right or left is observed. The interstitial markings are mildly prominent in the left lung but stable. There is an area patchy density lateral to the cardiac apex which appears to lie in the lingula. The heart and pulmonary vascularity are normal. There is calcification in the wall of the aortic arch. The bony thorax exhibits no acute abnormality. IMPRESSION: 1. Mild lingular atelectasis or early pneumonia. Stable parenchymal density in the right upper lobe and right paratracheal region. No CHF. Followup PA and lateral chest X-ray is recommended in 3-4 weeks following trial of antibiotic therapy to ensure resolution and exclude underlying malignancy. 2. Thoracic aortic atherosclerosis. Electronically Signed   By: David  Martinique M.D.   On: 09/08/2016 08:15   Ct Head Wo Contrast  Result Date: 09/25/2016 CLINICAL DATA:  Patient complains of LEFT-sided weakness. History of diabetes. Mild cognitive impairment. History of brain radiation. Recent drain placement into a diverticular abscess. EXAM: CT HEAD WITHOUT CONTRAST TECHNIQUE: Contiguous axial images were obtained from the base of the skull through the vertex without intravenous contrast. COMPARISON:  06/14/2016. FINDINGS: Brain: No evidence for acute infarction, hemorrhage, mass lesion, hydrocephalus, or  extra-axial fluid. Moderate cerebral and cerebellar atrophy. Extensive hypoattenuation of white matter likely representing a combination of post treatment effect and small vessel disease. No areas of vasogenic edema. Vascular: No hyperdense vessel or unexpected calcification. Skull: Normal. Negative for fracture or focal lesion. Sinuses/Orbits: No acute finding. Other: None. IMPRESSION: Atrophy and small vessel disease.  No acute intracranial findings. Electronically Signed   By: Staci Righter M.D.   On: 09/25/2016 09:55   Ct Angio Neck W Or Wo Contrast  Result Date: 09/26/2016 CLINICAL DATA:  Left arm numbness. Abnormal MRI. 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 Next item EXAM: CT ANGIOGRAPHY HEAD AND NECK CT ANGIOGRAPHY HEAD TECHNIQUE: Multidetector CT imaging of the head and neck was performed using the standard protocol during bolus administration of intravenous contrast. Multiplanar CT image reconstructions and MIPs were obtained to evaluate the vascular anatomy. Carotid stenosis measurements (when applicable) are obtained utilizing NASCET criteria, using the distal internal carotid diameter as the denominator. CONTRAST:  50 mL Isovue 3 7 a COMPARISON:  MRI brain 09/25/2016. CT head without contrast 09/25/2016. FINDINGS: CT HEAD FINDINGS Brain: The punctate subcortical left parietal lobe infarct is not visualized by CT. Moderate diffuse periventricular and subcortical white matter disease is again seen. The basal ganglia are intact. Insular ribbon is normal bilaterally. No acute hemorrhage or mass lesion is present. Vascular: No hyperdense vessel or unexpected calcification. Skull: The calvarium is intact. No focal lytic or blastic lesions are present. Sinuses: The paranasal sinuses are clear. There is some fluid in left mastoid air cells. No obstructing nasopharyngeal lesion is present. Orbits: Bilateral lens replacements are present. The globes and orbits are otherwise within normal  limits. Review of the MIP images confirms the above findings CTA NECK FINDINGS Aortic arch: Atherosclerotic calcifications are present at the aortic arch without significant stenosis. There is a common origin of the left common carotid artery in the innominate artery. Right carotid system: The right common carotid artery is within normal limits. The bifurcation is unremarkable. There is minimal tortuosity in the cervical right ICA without significant stenosis. Left carotid system: A left common carotid artery is within  normal limits. Minimal calcifications are present at the left carotid bifurcation. Mild tortuosity is present in the cervical left ICA without significant stenosis. Vertebral arteries: The right vertebral artery is dominant. The left vertebral artery origin is not visualized. A hypoplastic vessel is reconstituted at the level of C5-6 on the left with segmental stenosis at C4 and C5. There is focal high-grade stenosis at the C2 level is well. Skeleton: Multilevel degenerative changes in the cervical spine are most pronounced at C4-5 with chronic loss of disc space and slight retrolisthesis. Right greater than left foraminal narrowing is present at C3-4 and C4-5. The patient is edentulous. No focal lytic or blastic lesions are present. Other neck: No focal mucosal or submucosal lesions are present. There is no significant adenopathy. Salivary glands are within normal limits. 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 Upper chest: Right perihilar and medial upper lobe soft tissue is stable, compatible with prior treatment. Right-sided bronchiectasis is noted. The left lung is clear. Review of the MIP images confirms the above findings CTA HEAD FINDINGS Anterior circulation: The internal carotid arteries are within normal limits from the high cervical segments through the ICA termini bilaterally. There is mild narrowing on the right of less than 50%. The ICA termini are within normal limits bilaterally. The A1  and M1 segments are normal. The left A1 segment is dominant. Anterior communicating artery is patent. MCA bifurcations are intact bilaterally. The ACA and MCA branch vessels are within normal limits. Posterior circulation: The right vertebral artery is the dominant vessel. PICA origins are below the dural margin bilaterally. The vertebrobasilar junction is normal. The basilar artery is small. Both posterior cerebral arteries originate from the basilar tip. The PCA branch vessels are within normal limits. Venous sinuses: The dural sinuses are patent. The right transverse sinus is dominant. The straight sinus and deep cerebral veins are intact. Cortical veins are unremarkable. Anatomic variants: None. Delayed phase: The postcontrast images demonstrate no pathologic enhancement. Review of the MIP images confirms the above findings IMPRESSION: 1. 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 2. 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 3. Small left parietal lobe infarct is not visualized by CT. 4. Moderate diffuse white matter disease is again seen. 5. Minimal atherosclerotic calcifications at the left carotid bifurcation without significant stenosis. 6. No significant intracranial vascular disease is evident. Electronically Signed   By: San Morelle M.D.   On: 09/26/2016 12:13   Ct Chest Wo Contrast  Result Date: 09/22/2016 CLINICAL DATA:  Continued shortness of breath and coughing with chest pain for several days. History of lung cancer EXAM: CT CHEST WITHOUT CONTRAST TECHNIQUE: Multidetector CT imaging of the chest was performed following the standard protocol without IV contrast. COMPARISON:  09/14/2016 FINDINGS: Cardiovascular: Slight increase in the small pericardial effusion. Coronary artery calcification is noted. Atherosclerotic calcification is noted in the wall of the thoracic aorta. Right-sided central line tip  is positioned in the distal SVC. Mediastinum/Nodes: Persistent abnormal soft tissue attenuation identified in the right hilum and right suprahilar region. The esophagus has normal imaging features. There is no axillary lymphadenopathy. Lungs/Pleura: Airspace consolidation in the medial right upper lung and apex is not substantially changed interval, presumably representing post radiation fibrosis. The asymmetric, right greater than left ground-glass attenuation seen previously has improved in the interval as has the interlobular septal thickening. Small to moderate right pleural effusion and small left pleural effusion are slightly progressed  in the interval. Upper Abdomen: Unremarkable. Musculoskeletal: Bone windows reveal no worrisome lytic or sclerotic osseous lesions. Apparent nondisplaced upper sternal fracture unchanged. IMPRESSION: 1. Post treatment changes medial right upper lobe involving the right hilum and apex are not substantially changed in the interval. 2. Scattered ground-glass attenuation with interlobular septal thickening seen previously has decreased in the interval. 3. Slight progression of bilateral pleural effusions. Electronically Signed   By: Misty Stanley M.D.   On: 09/22/2016 11:32   Ct Chest W Contrast  Result Date: 09/14/2016 CLINICAL DATA:  Diverticulitis post abscess drainage and surgery, complicated by a supraventricular tachycardia then cardiac arrest post CPR and defibrillation, intubation, hypertensive, history COPD, coronary disease, diabetes mellitus, hypertension, non-small-cell lung cancer EXAM: CT CHEST, ABDOMEN, AND PELVIS WITH CONTRAST TECHNIQUE: Multidetector CT imaging of the chest, abdomen and pelvis was performed following the standard protocol during bolus administration of intravenous contrast. Sagittal and coronal MPR images reconstructed from axial data set. CONTRAST:  Dilute oral contrast.  100 cc Isovue 300 IV. COMPARISON:  CT chest 06/14/2016, CT abdomen and  pelvis 09/11/2016 FINDINGS: CT CHEST FINDINGS Cardiovascular: Atherosclerotic calcifications aorta and coronary arteries. Aorta normal caliber. Thoracic vascular structures grossly patent on nondedicated exam. Small pericardial fusion. Mediastinum/Nodes: Nasogastric tube traverses esophagus into stomach. Tip of endotracheal tube above carina. Small dense calcification within RIGHT thyroid lobe is unchanged. Base of cervical region otherwise unremarkable. Palpable with soft tissue density at the RIGHT hilum and paramediastinal RIGHT upper lobe, patient with known radiation fibrosis. Lungs/Pleura: Small BILATERAL pleural effusions larger on RIGHT. In addition to the previously identified radiation fibrosis changes in the RIGHT upper lobe, additional RIGHT upper lobe consolidation is seen question pneumonia. Compressive atelectasis in the lower lobes. Additional scattered airspace infiltrates primarily in RIGHT lung which could represent infection or less likely asymmetric edema. No pneumothorax. Musculoskeletal: Essentially nondisplaced mid sternal fracture sagittal image 81. Fractures of the anterior RIGHT third and fifth ribs. CT ABDOMEN PELVIS FINDINGS Hepatobiliary: Gallbladder surgically absent. Mild central intrahepatic and extrahepatic biliary dilatation little changed. No focal liver lesions. Pancreas: Normal appearance Spleen: Normal appearance Adrenals/Urinary Tract: Adrenal glands normal appearance. Foley catheter decompresses urinary bladder. Kidneys in ureters normal appearance. Stomach/Bowel: Diverticulosis changes at the sigmoid colon with decreased surrounding inflammation versus prior study. Abscess collection seen on the previous exam no longer identified, pigtail drainage catheter at site. Normal appendix. Stomach and bowel loops otherwise normal appearance. Vascular/Lymphatic: Atherosclerotic calcification aorta without aneurysm. No significant adenopathy. Reproductive: Uterus surgically absent.  Other: Small amounts of free pelvic fluid and perihepatic/perisplenic fluid without free air. Small umbilical hernia containing fat. Musculoskeletal: No acute osseous findings. 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 Additional RIGHT upper lobe and scattered RIGHT lung infiltrates question pneumonia versus less likely asymmetric edema. Small BILATERAL pleural effusions RIGHT greater than LEFT with minimal adjacent compressive atelectasis of the lower lobes. Improved diverticulitis changes with resolution of the previously identified abscess collection adjacent to the sigmoid colon. Fractures of the sternum and the anterior RIGHT third and fifth ribs suspect related to CPR. Small pericardial fusion. Electronically Signed   By: Lavonia Dana M.D.   On: 09/14/2016 13:01   Mr Brain Wo Contrast  Result Date: 09/25/2016 CLINICAL DATA:  Left arm numbness EXAM: MRI HEAD WITHOUT CONTRAST TECHNIQUE: Multiplanar, multiecho pulse sequences of the brain and surrounding structures were obtained without intravenous contrast. COMPARISON:  Head CT from earlier today. FINDINGS: Brain: Restricted diffusion in the subcortical left parietal  lobe consistent with acute infarct. No definite explanation for left arm numbness. No hemorrhage, hydrocephalus, or mass. Confluent cerebral white matter T2 and FLAIR hyperintensity. Generalized atrophy, progressed from 2015 and greater than expected for age Vascular: There is loss of expected flow void in the left vertebral artery at the V3 segment. Normal flow void in the intradural segment but smaller than on comparison scan. Skull and upper cervical spine: No focal lesion. Sinuses/Orbits: Bilateral cataract resection. IMPRESSION: 1. 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 2. 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 Generalized atrophy has progressed from 2015.  3. Abnormal appearance of the left vertebral artery suggesting slow flow or occlusion in the neck, new from 2015. Electronically Signed   By: Monte Fantasia M.D.   On: 09/25/2016 16:06   Ct Abdomen Pelvis W Contrast  Result Date: 10/06/2016 CLINICAL DATA:  Pelvic diverticulitis complicated by abscess. Status post percutaneous drainage 09/21/2016 EXAM: CT ABDOMEN AND PELVIS WITH CONTRAST TECHNIQUE: Multidetector CT imaging of the abdomen and pelvis was performed using the standard protocol following bolus administration of intravenous contrast. CONTRAST:  145m ISOVUE-300 IOPAMIDOL (ISOVUE-300) INJECTION 61% COMPARISON:  09/20/2016, 09/21/2016 FINDINGS: Lower chest: New trace residual right pleural effusion. Resolved bibasilar atelectasis. Healing lateral left rib fractures. Normal heart size. Thoracic aortic atherosclerosis present. Hepatobiliary: Remote cholecystectomy. Stable 8 mm enhancing focus in the right hepatic dome anteriorly, images 16 compatible with a transient hepatic attenuation defect versus a small hemangioma. No other hepatic abnormality. No biliary dilatation. Pancreas: Unremarkable. No pancreatic ductal dilatation or surrounding inflammatory changes. Spleen: Normal in size without focal abnormality. Adrenals/Urinary Tract: Adrenal glands are unremarkable. Kidneys are normal, without renal calculi, focal lesion, or hydronephrosis. Bladder is unremarkable. Stomach/Bowel: Negative for bowel obstruction, significant dilatation, ileus, or free air. Normal appendix demonstrated. Anterior midline pelvic abscess drain is stable in position adjacent to the sigmoid colon. Previous abscess is completely drain. Diverticulitis has resolved. No new fluid collection. Extra luminal air present at the drain catheter site suspicious for residual fistula. Vascular/Lymphatic: Aortic atherosclerosis evident. Negative for aneurysm or occlusive process. No adenopathy. Reproductive: Remote hysterectomy.  No adnexal  mass or abnormality. Other: No abdominal wall hernia or abnormality. No abdominopelvic ascites. Musculoskeletal: Degenerative changes noted of the spine. No acute osseous finding. IMPRESSION: Resolved pelvic diverticular abscess, status post percutaneous drainage. Resolved diverticulitis. Small amount of extraluminal air about the sigmoid colon at the drain catheter site suspicious for residual fistula. Fluoroscopic injection to assess for fistula will also be performed today. No new fluid collection, or obstruction. Electronically Signed   By: MJerilynn Mages  Jolon Degante M.D.   On: 10/06/2016 11:43   Ct Abdomen Pelvis W Contrast  Result Date: 09/20/2016 CLINICAL DATA:  Diverticulitis. EXAM: CT ABDOMEN AND PELVIS WITH CONTRAST TECHNIQUE: Multidetector CT imaging of the abdomen and pelvis was performed using the standard protocol following bolus administration of intravenous contrast. CONTRAST:  105mISOVUE-300 IOPAMIDOL (ISOVUE-300) INJECTION 61% COMPARISON:  CT scan of September 14, 2016. FINDINGS: Lower chest: Mild bilateral pleural effusions are noted with adjacent subsegmental atelectasis. Hepatobiliary: Status post cholecystectomy. Stable intrahepatic biliary dilatation is noted which most likely is related to post cholecystectomy status. No focal abnormality is seen in the liver. Pancreas: Unremarkable. No pancreatic ductal dilatation or surrounding inflammatory changes. Spleen: Normal in size without focal abnormality. Adrenals/Urinary Tract: Adrenal glands are unremarkable. Kidneys are normal, without renal calculi, focal lesion, or hydronephrosis. Bladder is unremarkable. Stomach/Bowel: Drainage catheter noted in para diverticular abscess  on prior exam has been removed. 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 Mild inflammatory changes and fluid remain within the  pelvis. There is no evidence of bowel obstruction. The appendix appears normal. Vascular/Lymphatic: Aortic atherosclerosis. No enlarged abdominal or pelvic lymph nodes. Reproductive: Status post hysterectomy. No adnexal masses. Other: None. Musculoskeletal: Mild degenerative disc disease is noted at L5-S1. IMPRESSION: Drainage catheter noted in paradiverticular abscess on prior exam has been removed. 3.9 x 2.1 cm persistent fluid collection with air-fluid level remains, concerning for abscess and possible fistulous connection to the sigmoid colon. Mild inflammatory changes in fluid remain within the pelvis. Aortic atherosclerosis. Mild bilateral pleural effusions with adjacent subsegmental atelectasis. Electronically Signed   By: Marijo Conception, M.D.   On: 09/20/2016 15:45   Ct Abdomen Pelvis W Contrast  Result Date: 09/14/2016 CLINICAL DATA:  Diverticulitis post abscess drainage and surgery, complicated by a supraventricular tachycardia then cardiac arrest post CPR and defibrillation, intubation, hypertensive, history COPD, coronary disease, diabetes mellitus, hypertension, non-small-cell lung cancer EXAM: CT CHEST, ABDOMEN, AND PELVIS WITH CONTRAST TECHNIQUE: Multidetector CT imaging of the chest, abdomen and pelvis was performed following the standard protocol during bolus administration of intravenous contrast. Sagittal and coronal MPR images reconstructed from axial data set. CONTRAST:  Dilute oral contrast.  100 cc Isovue 300 IV. COMPARISON:  CT chest 06/14/2016, CT abdomen and pelvis 09/11/2016 FINDINGS: CT CHEST FINDINGS Cardiovascular: Atherosclerotic calcifications aorta and coronary arteries. Aorta normal caliber. Thoracic vascular structures grossly patent on nondedicated exam. Small pericardial fusion. Mediastinum/Nodes: Nasogastric tube traverses esophagus into stomach. Tip of endotracheal tube above carina. Small dense calcification within RIGHT thyroid lobe is unchanged. Base of cervical region  otherwise unremarkable. Palpable with soft tissue density at the RIGHT hilum and paramediastinal RIGHT upper lobe, patient with known radiation fibrosis. Lungs/Pleura: Small BILATERAL pleural effusions larger on RIGHT. In addition to the previously identified radiation fibrosis changes in the RIGHT upper lobe, additional RIGHT upper lobe consolidation is seen question pneumonia. Compressive atelectasis in the lower lobes. Additional scattered airspace infiltrates primarily in RIGHT lung which could represent infection or less likely asymmetric edema. No pneumothorax. Musculoskeletal: Essentially nondisplaced mid sternal fracture sagittal image 81. Fractures of the anterior RIGHT third and fifth ribs. CT ABDOMEN PELVIS FINDINGS Hepatobiliary: Gallbladder surgically absent. Mild central intrahepatic and extrahepatic biliary dilatation little changed. No focal liver lesions. Pancreas: Normal appearance Spleen: Normal appearance Adrenals/Urinary Tract: Adrenal glands normal appearance. Foley catheter decompresses urinary bladder. Kidneys in ureters normal appearance. Stomach/Bowel: Diverticulosis changes at the sigmoid colon with decreased surrounding inflammation versus prior study. Abscess collection seen on the previous exam no longer identified, pigtail drainage catheter at site. Normal appendix. Stomach and bowel loops otherwise normal appearance. Vascular/Lymphatic: Atherosclerotic calcification aorta without aneurysm. No significant adenopathy. Reproductive: Uterus surgically absent. Other: Small amounts of free pelvic fluid and perihepatic/perisplenic fluid without free air. Small umbilical hernia containing fat. Musculoskeletal: No acute osseous findings. 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 Additional RIGHT upper lobe and scattered RIGHT lung infiltrates question pneumonia versus less likely asymmetric edema. Small BILATERAL pleural effusions  RIGHT greater than LEFT with minimal adjacent compressive atelectasis of the lower lobes. Improved diverticulitis changes with resolution of the previously identified abscess collection adjacent to the sigmoid colon. Fractures of the sternum and the anterior RIGHT third and fifth ribs suspect  related to CPR. Small pericardial fusion. Electronically Signed   By: Lavonia Dana M.D.   On: 09/14/2016 13:01   Ct Abdomen Pelvis W Contrast  Result Date: 09/11/2016 CLINICAL DATA:  70 year old female with abdominal, pelvic and flank pain for 1 day. History of lung cancer, appendectomy, cholecystectomy and hysterectomy. EXAM: CT ABDOMEN AND PELVIS WITH CONTRAST TECHNIQUE: Multidetector CT imaging of the abdomen and pelvis was performed using the standard protocol following bolus administration of intravenous contrast. CONTRAST:  157m ISOVUE-300 IOPAMIDOL (ISOVUE-300) INJECTION 61% COMPARISON:  06/14/2016 and prior exams. FINDINGS: Lower chest: No acute abnormality. Hepatobiliary: No acute or suspicious hepatic abnormality. The patient is status post cholecystectomy. Pancreas: Unremarkable Spleen: Unremarkable Adrenals/Urinary Tract: The kidneys, adrenal glands and bladder are unremarkable. Stomach/Bowel: Focal diverticulitis of the mid sigmoid colon is noted. A 2.5 x 4.1 cm abscess along the medial aspect of the mid sigmoid colon is noted (image 73). Adjacent foci of free air and along the left pelvic side wall are noted. A small amount of free complex fluid within the pelvis is present. There is no evidence of bowel obstruction. No other focal areas of bowel wall thickening are present. The appendix is normal. Vascular/Lymphatic: Abdominal aortic atherosclerotic calcifications noted without aneurysm. No enlarged lymph nodes identified. Reproductive: Status post hysterectomy. No adnexal masses. Other: None Musculoskeletal: No acute or suspicious abnormalities. IMPRESSION: Focal diverticulitis of the mid sigmoid colon with  adjacent 2.5 x 4.1 cm abscess and foci of pneumoperitoneum in this area. Small amount of complex free fluid within the pelvis. Abdominal aortic atherosclerosis. Electronically Signed   By: JMargarette CanadaM.D.   On: 09/11/2016 13:38   Ir Sinus/fist Tube Chk-non Gi  Result Date: 09/19/2016 INDICATION: 70year old female with a history of diverticular abscess, status post drainage. EXAM: SINUS TRACT INJECTION/FISTULOGRAM MEDICATIONS: The patient is currently admitted to the hospital and receiving intravenous antibiotics. The antibiotics were administered within an appropriate time frame prior to the initiation of the procedure. ANESTHESIA/SEDATION: None COMPLICATIONS: None PROCEDURE: Informed written consent was obtained from the patient after a thorough discussion of the procedural risks, benefits and alternatives. All questions were addressed. Maximal Sterile Barrier Technique was utilized. Time out was performed. The indwelling pelvis drain was injected under fluoroscopy. Spot images were acquired in multiple obliquities. Catheter was attached to drainage bag. FINDINGS: No fistula identified. Free contrast within the pelvis with no large residual collection. IMPRESSION: Status post drain injection demonstrating no fistula or residual large pelvic collection. Signed, JDulcy Fanny WEarleen Newport DO [facility] Electronically Signed   By: JCorrie MckusickD.O.   On: 09/19/2016 09:51   Dg Sinus/fist Tube Chk-non Gi  Result Date: 10/06/2016 INDICATION: Pelvic diverticular abscess, status post CT percutaneous drainage 09/12/2016. Outpatient follow-up. EXAM: ABSCESS INJECTION MEDICATIONS: None.  Patient has completed antibiotics as an outpatient ANESTHESIA/SEDATION: None. COMPLICATIONS: None immediate. PROCEDURE: Under fluoroscopy, the existing midline pelvic diverticular abscess catheter was injected with contrast. This demonstrates a collapsed abscess cavity at the  drain catheter site correlating with the CT. 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 Diverticulosis present. IMPRESSION: Stable drain catheter position. 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 Electronically Signed   By: MJerilynn Mages  Nakota Ackert M.D.   On: 10/06/2016 11:11   Dg Chest Port 1 View  Result Date: 09/22/2016 CLINICAL DATA:  Shortness of breath. EXAM: PORTABLE CHEST 1 VIEW COMPARISON:  09/21/2016 .  CT 09/14/2016. FINDINGS: Right IJ line in  stable position. Persistent right upper lobe mass/ infiltrate and probable post treatment changes noted. Low lung volumes with mild basilar atelectasis. No prominent pleural effusion. No pneumothorax. Heart size normal. No acute bony abnormality . IMPRESSION: 1.  Right IJ line stable position. 2. Persistent right upper lobe mass/infiltrate and probable post treatment changes. No significant change. Low lung volumes. Chest is stable from prior exam . Electronically Signed   By: Marcello Moores  Register   On: 09/22/2016 07:31   Dg Chest Port 1 View  Result Date: 09/14/2016 CLINICAL DATA:  Central line insertion. EXAM: PORTABLE CHEST 1 VIEW 6:36 p.m. COMPARISON:  09/14/2016 8:40 a.m. FINDINGS: New right central line is been inserted and the tip appears in excellent position. Left central line, endotracheal tube, and NG tube appear in good position, unchanged. Right pleural effusion caps the right apex, unchanged. No pneumothorax. Scarring at the superior aspect of the right hilum, obscured by the ventilation apparatus. IMPRESSION: New right-sided central line appears in good position. No acute changes. Electronically Signed   By: Lorriane Shire M.D.   On: 09/14/2016 19:01   Dg Chest Port 1 View  Result Date: 09/14/2016 CLINICAL DATA:  Central line placement EXAM: PORTABLE CHEST 1 VIEW COMPARISON:  09/14/2016 FINDINGS: Cardiomediastinal silhouette is stable. Stable endotracheal tube  position. NG tube in place. There is persistent consolidation in right upper lobe. Left IJ central line with tip in distal SVC. No pneumothorax. IMPRESSION: Stable support apparatus. Left IJ central line in place. No pneumothorax. Electronically Signed   By: [name] M.D.   On: 09/14/2016 09:09   Dg Chest Port 1 View  Result Date: 09/14/2016 CLINICAL DATA:  Hypoxia EXAM: PORTABLE CHEST 1 VIEW COMPARISON:  September 12, 2016 chest radiograph and chest CT June 14, 2016 FINDINGS: Endotracheal tube tip is 3.1 cm above the carina. No pneumothorax. There is persistent airspace consolidation throughout much of the right upper lobe. There is suspected residual mass in this area medially. The lungs elsewhere are clear. Heart size and pulmonary vascularity are normal. No adenopathy. There is atherosclerotic calcification in the aorta. No bone lesions. Incidental note is made of small cervical ribs bilaterally. IMPRESSION: Endotracheal tube as described without pneumothorax. There is airspace consolidation throughout much the right upper lobe. There is a nodular area in the medial aspect of the right upper lobe measuring 2.3 x 2.0 cm, concerning for residual mass in this area. This finding may warrant correlation with contrast enhanced chest CT to further assess. 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 Lungs elsewhere clear. Stable cardiac silhouette. There is aortic atherosclerosis. Electronically Signed   By: Lowella Grip III M.D.   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 EXAM: CT IMAGE GUIDED FLUID DRAIN BY CATHETER MEDICATIONS: The patient is currently admitted to the hospital and receiving intravenous antibiotics. The antibiotics were administered within an appropriate time frame prior to the initiation of the procedure.  ANESTHESIA/SEDATION: Fentanyl 100 mcg IV; Versed 2 mg IV Moderate Sedation Time:  15 minutes The patient was continuously monitored during the procedure by the interventional radiology nurse under my direct supervision. COMPLICATIONS: None immediate. PROCEDURE: Informed written consent was obtained from the patient after a thorough discussion of the procedural risks, benefits and alternatives. All questions were addressed. Maximal Sterile Barrier Technique was utilized including caps, mask, sterile gowns, sterile gloves, sterile drape,  hand hygiene and skin antiseptic. A timeout was performed prior to the initiation of the procedure. A planning axial CT scan was performed. The fluid and gas collection adjacent to the sigmoid colon was successfully identified. A suitable skin entry site was selected and marked. The region was sterilely prepped and draped in standard fashion using chlorhexidine skin prep. Local anesthesia was attained by infiltration with 1% lidocaine. Under intermittent CT guidance, a 20 cm 18 gauge trocar needle was advanced into the fluid and gas collection. Initial aspiration yields frankly purulent material. Therefore, the decision was made to proceed with drain placement. An Amplatz wire was advanced through the needle and coiled within the fluid and gas collection. The needle was removed and the skin tract was dilated to 10 [place]. A Cook 10.2 [place] all-purpose drainage catheter was then advanced over the wire and formed in the fluid collection. Aspiration yields approximately 10 mL thick purulent material. A sample was sent for culture. The catheter was flushed and connected to JP bulb suction. The catheter was then secured to the skin with 0 Prolene suture and an adhesive fixation device. IMPRESSION: Successful CT-guided drain placement into recurrent diverticular abscess. Electronically Signed   By: Jacqulynn Cadet M.D.   On: 09/21/2016 16:35   Ct Image Guided Fluid Drain By  Catheter  Result Date: 09/12/2016 CLINICAL DATA:  Sigmoid diverticulitis with focal diverticular abscess. EXAM: CT GUIDED CATHETER DRAINAGE OF PERITONEAL ABSCESS ANESTHESIA/SEDATION: 1.5 mg IV Versed 50 mcg IV Fentanyl Total Moderate Sedation Time:  30 minutes The patient's level of consciousness and physiologic status were continuously monitored during the procedure by Radiology nursing. PROCEDURE: The procedure, risks, benefits, and alternatives were explained to the patient. Questions regarding the procedure were encouraged and answered. The patient understands and consents to the procedure. A time out was performed prior to initiating the procedure. The anterior abdominal wall was prepped with chlorhexidine in a sterile fashion, and a sterile drape was applied covering the operative field. A sterile gown and sterile gloves were used for the procedure. Local anesthesia was provided with 1% Lidocaine. CT was performed in a supine position through the pelvis. Under CT guidance, an 18 gauge trocar needle was advanced to the level of a sigmoid diverticular abscess. After advancing a needle into the abscess, a guidewire was advanced. The tract was dilated and a 10 French percutaneous drainage catheter advanced over the wire. Catheter position was confirmed by CT. A fluid sample was aspirated and sent for culture analysis. The catheter was connected to suction bulb drainage. It was secured at the skin with a Prolene retention suture and StatLock device. COMPLICATIONS: None FINDINGS: Anterior percutaneous window was present to the level of the diverticular abscess located medial to the sigmoid colon. Aspiration yielded bloody and purulent fluid. The drainage catheter was able to be formed in the abscess. IMPRESSION: CT-guided percutaneous drainage of diverticular peritoneal abscess located adjacent to the sigmoid colon. A sample of purulent fluid was sent for culture analysis. Electronically Signed   By: Aletta Edouard M.D.   On: 09/12/2016 16:45    Labs:  CBC:  Recent Labs  09/21/16 0331 09/22/16 0532 09/23/16 0213 09/27/16 0206  WBC 12.1* 10.4 9.7 10.3  HGB 9.6* 9.6* 9.6* 10.3*  HCT 29.2* 29.2* 29.6* 31.4*  PLT 189 211 243 314    COAGS:  Recent Labs  06/14/16 2032 09/12/16 0422 09/15/16 0450 09/21/16 0815  INR 1.20 1.34 1.29 1.12  APTT 34  --  26  --  BMP:  Recent Labs  09/21/16 0331 09/22/16 0532 09/23/16 0213 09/24/16 0220 09/27/16 0206  NA 140 139 137  --  136  K 3.2* 3.8 3.0* 4.1 3.9  CL 102 103 94*  --  100*  CO2 29 25 32  --  29  GLUCOSE 137* 102* 213*  --  157*  BUN '8 8 10  '$ --  12  CALCIUM 8.5* 8.5* 8.5*  --  9.1  CREATININE 0.87 0.85 0.99  --  0.90  GFRNONAA >60 >60 57*  --  >60  GFRAA >60 >60 >60  --  >60    LIVER FUNCTION TESTS:  Recent Labs  12/29/15 2327 06/14/16 1158 09/11/16 1030 09/12/16 0422 09/16/16 0434  BILITOT 0.3 0.9 0.5 0.7  --   AST '24 22 17 '$ 14*  --   ALT '31 19 16 '$ 13*  --   ALKPHOS 92 105 86 67  --   PROT 6.8 7.8 6.9 5.2*  --   ALBUMIN 3.6 4.0 3.5 2.6* 2.3*     Assessment and Plan:  Two-week status post percutaneous drainage of a pelvic diverticular abscess. Repeat CT today demonstrates resolution of the abscess and the diverticulitis.  Fluoroscopic drain injection confirms a residual patent fistula from the collapsed abscess cavity site to the adjacent sigmoid colon.  Plan: Exchange drain catheter suction bulb for a gravity bag. Discontinue drain flushing. Outpatient follow-up in 2 weeks with a fluoroscopic drain injection only to reassess for fistula closure.    Electronically Signed: Greggory Keen 10/06/2016, 11:53 AM   I spent a total of  30 Minutes   in face to face in clinical consultation, greater than 50% of which was counseling/coordinating care for this patient with a pelvic diverticular abscess.

## 2016-10-11 ENCOUNTER — Ambulatory Visit
Admission: RE | Admit: 2016-10-11 | Discharge: 2016-10-11 | Disposition: A | Discharge status: Home / Self Care | Payer: Medicare Other | Source: Ambulatory Visit | Attending: Internal Medicine | Admitting: Internal Medicine

## 2016-10-11 ENCOUNTER — Other Ambulatory Visit: Payer: Self-pay | Admitting: Internal Medicine

## 2016-10-11 DIAGNOSIS — R059 Cough, unspecified: Secondary | ICD-10-CM

## 2016-10-11 DIAGNOSIS — J189 Pneumonia, unspecified organism: Secondary | ICD-10-CM

## 2016-10-11 DIAGNOSIS — R05 Cough: Secondary | ICD-10-CM

## 2016-10-18 ENCOUNTER — Encounter: Payer: Self-pay | Admitting: Family Medicine

## 2016-10-18 ENCOUNTER — Other Ambulatory Visit: Payer: Self-pay | Admitting: *Deleted

## 2016-10-18 NOTE — Patient Outreach (Signed)
[facility]) Care Management  10/18/2016  [name] 1946/02/22 [id number]  CSW was able to meet with patient and patient's daughter, [name] today at [facility] [place] where patient currently resides to receive short-term rehabilitative services, to perform a routine visit.  Mrs. [name] reported that she still plans to have patient return home to live with her and her son, along with patient's husband, [name] at time of release from [facility].  Mrs. [name] has made arrangements for patient and Mr. [name] to receive 24 hour care and supervision, through various family members (Mrs. [name], Mrs. [name]'s son, Mrs. [name] nephew, Mrs. [name] brother, etc.), as both have morbid co-morbidities and rapidly failing health issues.  Mrs. [name] is agreeable to having CSW work with the admissions Coordinator/social worker at [facility] to arrange home health services for patient through [facility].  Mrs. [name] is hopeful that patient's Primary Care Physician, Dr. Shawnie Dapper will be agreeable to writing orders for patient to receive the following home health services: Home Health Physical Therapy, Home Health Occupational Therapy Home Health Nurse Home Health Speech Therapy Home Health Aide Mrs. [name] does not believe any durable medical equipment needs to be ordered for patient at this time. Mrs. [name] admits to being somewhat concerned about patient returning home with the Percutaneous Drain, Status-Post CT-Guided Pelvic Diverticular Abscess.  Mrs. [name] is hoping that the drain will be removed during patient's follow-up appointment on January 3rd.  Patient has already completed her course of antibiotics and is tolerating diet well.  CSW will plan to meet with patient and Mrs, [name] on January 5th at [facility] to attend patient's Discharge Planning Meeting, as well as assist with any additional discharge planning  needs and services. CSW will update patient's Geriatric Nurse Practitioner, [name], also with [place] [facility] Management. [name], [name], [name], [name]  Licensed Education officer, [facility]  Mailing [place] [address] [place], [place] [address] Physical Address-[address], [place], [place] [address] Toll Free Main # [phone] Fax # [phone] Cell # [phone]  Fax # [phone]  [name]

## 2016-10-24 DIAGNOSIS — E0590 Thyrotoxicosis, unspecified without thyrotoxic crisis or storm: Secondary | ICD-10-CM

## 2016-10-24 DIAGNOSIS — D649 Anemia, unspecified: Secondary | ICD-10-CM

## 2016-10-24 HISTORY — DX: Thyrotoxicosis, unspecified without thyrotoxic crisis or storm: E05.90

## 2016-10-24 HISTORY — DX: Anemia, unspecified: D64.9

## 2016-10-25 ENCOUNTER — Other Ambulatory Visit: Payer: Self-pay | Admitting: *Deleted

## 2016-10-25 NOTE — Patient Outreach (Signed)
[facility] ([facility]) Care Management  10/25/2016  Chardai F Ha 10/26/1945 [id number]   CSW was able to make contact with patient, with patient's daughter, Dolores Royster on the other line, today to ensure that patient was able to return home to live upon discharge from the skilled nursing facility, as well as assess for any additional social work needs and services.  Patient admits to being extremely happy to have been able to return home.  Patient agrees to work with home health therapies (both physical and occupational) and use durable medical equipment as recommended.  Patient, nor Mrs. Royster were able to think of any additional assistance and/or resources that CSW could provide to them at this time.  CSW was able to ensure that they have the correct contact information for CSW, encouraging them to contact CSW directly if social work needs are identified in the near future.  Patient is aware that she will be receiving a call from Carroll Spinks, Geriatric Nurse Practitioner with [facility] Care Management regarding disease management services. CSW will perform a case closure on patient, as all goals of treatment have been met from social work standpoint and no additional social work needs have been identified at this time.  CSW will notify patient's Geriatric RNCM with [facility], Carroll Spinks of CSW's plans to close patient's case.  CSW will fax an update to patient's Primary Care Physician, Dr. Phillip McGowen to ensure that they are aware of CSW's involvement with patient's plan of care.  CSW will submit a case closure request to Nicole Robinson, Care Management Assistant with [facility], in the form of an In Basket message.  CSW will ensure that Mrs. Robinson is aware of [name], RNCM with [facility], continued involvement with patient's care.  , [name], [name], [name]   Licensed Clinical Social Worker  [facility]  Mailing Address-[address], [place], [place] [address] Physical Address-[address], [place], [place] [address] Toll Free Main # [phone] Fax # [phone] Cell # [phone]  Fax # [phone]  [email or web address]        

## 2016-10-26 ENCOUNTER — Ambulatory Visit
Admission: RE | Admit: 2016-10-26 | Discharge: 2016-10-26 | Disposition: A | Discharge status: Home / Self Care | Payer: Medicare Other | Source: Ambulatory Visit | Attending: General Surgery | Admitting: General Surgery

## 2016-10-26 ENCOUNTER — Other Ambulatory Visit: Payer: Self-pay | Admitting: Family Medicine

## 2016-10-26 ENCOUNTER — Telehealth: Payer: Self-pay | Admitting: Family Medicine

## 2016-10-26 ENCOUNTER — Other Ambulatory Visit: Payer: Self-pay | Admitting: General Surgery

## 2016-10-26 DIAGNOSIS — K651 Peritoneal abscess: Secondary | ICD-10-CM | POA: Diagnosis not present

## 2016-10-26 DIAGNOSIS — Z4803 Encounter for change or removal of drains: Secondary | ICD-10-CM | POA: Diagnosis not present

## 2016-10-26 DIAGNOSIS — K5720 Diverticulitis of large intestine with perforation and abscess without bleeding: Secondary | ICD-10-CM

## 2016-10-26 HISTORY — PX: IR GENERIC HISTORICAL: IMG1180011

## 2016-10-26 NOTE — Telephone Encounter (Signed)
I'll put in order for home health PT and OT.

## 2016-10-26 NOTE — Telephone Encounter (Signed)
Patient's husband calling to report they received a call from Dr. Idelle Leech office yesterday asking if [facility] has contacted them regarding therapy for patient.  Patient calling back to report they still have not heard anything from [facility] as of today and would like the status of referral.   I was unable to provide status of referral as I do not show a recent referral to [facility].  The last referral for [facility] is from September 2017.  Please advise.

## 2016-10-26 NOTE — Progress Notes (Signed)
Patient ID: [name], female   DOB: [birth year], 71 y.o.   MRN: [id number]        Chief Complaint: Percutaneous drainage catheter evaluation and management  Referring Physician(s): Ramirez,Armando  History of Present Illness: [name] is a 71 y.o. female with past history significant for CAD, COPD and diabetes who underwent tentatively successful percutaneous drainage catheter placement on 09/12/2016 by Dr. Kathlene Cote for a diverticular abscess. Patient was initially seen at the interventional radiology drain clinic on 10/06/2016 with CT imaging demonstrating resolution of the diverticular abscess however subsequent drainage catheter injection demonstrating a persistent fistulas connection to the adjacent sigmoid colon. The patient returns today to the interventional radiology drain clinic for drainage catheter evaluation and management. She is accompanied by her daughter though serves as her own historian.  The patient reports approximately 10 mL of output from the percutaneous drainage catheter per day though has not maintained diligent out output records. The patient is a longer flushing the drainage catheter. Patient denies worsening abdominal pain. No fever or chills.  Past Medical History:  Diagnosis Date  . Age-related nuclear cataract of both eyes 2016   +cortical age related cataracts OU   . Allergy    seasonal  . Arthritis    hands  . Bilateral diabetic retinopathy (HCC) 2015   Dr. Zigmund Daniel  . Blood transfusion without reported diagnosis    when taking chemo  . CAD (coronary artery disease)    NON obstructive. Cath 2006 preserved LV fxn, scattered irregularities without critical stenosis. 2008 stress echo negative for ischemia, but with hypertensive response  . COPD (chronic obstructive pulmonary disease) ([place])   . Diabetes mellitus with complication (HCC)    diab retinopathy OU (laser)  . Diverticulitis 05/2016  . GERD (gastroesophageal reflux disease)    protonix  . History of diverticulitis of colon    with abscess; required IR percutaneous drain placed 09/2016.  Marland Kitchen Hyperlipidemia   . Hypertension   . Lung cancer ([place])    non-small cell lung ca, stage III in 05/2011; systemic chemotherapy concurrent with radiation followed by prophylactic cranial irradiation and has been observation since July of 2010 with no evidence for disease recurrence-released from onc f/u 12/2014 (needs annual cxr by PCP).  CXR 08/2015 stable.  . Mild cognitive impairment with memory loss    Likely from brain radiation therapy  . Open-angle glaucoma 2016   Dr. Shirley Muscat, (bilateral)---responding to topical therapy  . Osteopenia 03/2016   03/2016 DEXA T-score -2.2  . 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 ABDOMINAL HYSTERECTOMY  1997  . APPENDECTOMY    . CARDIAC CATHETERIZATION N/A 09/14/2016   Procedure: Left Heart Cath and Coronary Angiography;  Surgeon: Troy Sine, MD;  Location: [facility] CV LAB;  Service: Cardiovascular;  Laterality: N/A;  . CARDIAC CATHETERIZATION  09/14/2016   EF 25%, akinesis in a pattern of [place] CM.  Marland Kitchen CARPAL TUNNEL RELEASE    . CATARACT EXTRACTION    . CHOLECYSTECTOMY  2002  . COLONOSCOPY  10/24/00   normal.  [facility] hemoccult testing via [place] 06/18/15 was NEG  . IR GENERIC HISTORICAL  09/19/2016   IR SINUS/FIST TUBE CHK-NON GI 09/19/2016 Corrie Mckusick, DO [facility]-INTERV RAD  . LEFT HEART CATHETERIZATION WITH CORONARY ANGIOGRAM N/A 05/30/2012   Procedure: LEFT HEART CATHETERIZATION WITH CORONARY ANGIOGRAM;  Surgeon: Burnell Blanks, MD;  Location: [facility] CATH  LAB;  Service: Cardiovascular;  Laterality: N/A;  . TRANSTHORACIC ECHOCARDIOGRAM  09/14/2016   EF 30-35 %, Akinesis of the mid-apicalanteroseptal myocardium.  Grade I DD.  Mild pulm HTN.    Allergies: Lactose intolerance (gi) and Ibuprofen  Medications: Prior to  Admission medications   Medication Sig Start Date End Date Taking? Authorizing Provider  acetaminophen (TYLENOL) 500 MG tablet Take 1,000 mg by mouth every 6 (six) hours as needed for headache (pain).    Historical Provider, MD  albuterol (PROAIR HFA) 108 (90 Base) MCG/ACT inhaler Inhale 2 puffs into the lungs every 6 (six) hours as needed for wheezing or shortness of breath.    Historical Provider, MD  albuterol (PROVENTIL) (2.5 MG/3ML) 0.083% nebulizer solution USE ONE VIAL IN NEBULIZER EVERY 4 HOURS AS NEEDED FOR WHEEZING OR SHORTNESS OF BREATH 12/16/15   [name], MD  amoxicillin-clavulanate (AUGMENTIN) 875-125 MG tablet Take 1 tablet by mouth every 12 (twelve) hours. 5 more days. Must follow with the drain clinic and Gen. surgery before stopping this medication 09/27/16   Thurnell Lose, MD  atorvastatin (LIPITOR) 80 MG tablet TAKE ONE TABLET BY MOUTH ONCE DAILY Patient taking differently: TAKE ONE TABLET BY MOUTH ONCE DAILY AT BEDTIME 07/18/16   [name], MD  Bromfenac Sodium (PROLENSA) 0.07 % SOLN Place 1 drop into the left eye at bedtime.    Historical Provider, MD  clopidogrel (PLAVIX) 75 MG tablet Take 1 tablet (75 mg total) by mouth daily. 09/28/16   Thurnell Lose, MD  feeding supplement (BOOST / RESOURCE BREEZE) LIQD Take 1 Container by mouth 2 (two) times daily between meals. 09/24/16   Thurnell Lose, MD  FLOVENT HFA 220 MCG/ACT inhaler INHALE TWO PUFFS BY MOUTH TWICE DAILY Patient not taking: Reported on 09/11/2016 01/12/16   [name], MD  fluticasone (FLONASE) 50 MCG/ACT nasal spray Place 2 sprays into both nostrils at bedtime. 06/25/14   [name], MD  furosemide (LASIX) 40 MG tablet Take 1 tablet (40 mg total) by mouth daily. 09/28/16   Thurnell Lose, MD  insulin aspart (NOVOLOG) 100 UNIT/ML injection Before each meal 3 times a day, 140-199 - 2 units, 200-250 - 4 units, 251-299 - 6 units,  300-349 - 8 units,  350 or above 10 units. Dispense syringes  and needles as needed, Ok to switch to PEN if approved. Substitute to any brand approved. DX DM2, Code E11.65 09/24/16   Thurnell Lose, MD  lisinopril (PRINIVIL,ZESTRIL) 2.5 MG tablet Take 1 tablet (2.5 mg total) by mouth 2 (two) times daily. 09/24/16   Thurnell Lose, MD  loratadine (CLARITIN) 10 MG tablet Take 10 mg by mouth at bedtime.     Historical Provider, MD  Metoprolol Tartrate 37.5 MG TABS Take 37.5 mg by mouth 2 (two) times daily. 09/24/16   Thurnell Lose, MD  nitroGLYCERIN (NITROSTAT) 0.4 MG SL tablet Place 1 tablet (0.4 mg total) under the tongue every 5 (five) minutes as needed. For chest pain Patient taking differently: Place 0.4 mg under the tongue every 5 (five) minutes as needed for chest pain.  12/31/12   Lisabeth Pick, MD  pantoprazole (PROTONIX) 40 MG tablet TAKE ONE TABLET BY MOUTH ONCE DAILY Patient taking differently: TAKE ONE TABLET BY MOUTH ONCE DAILY AT BEDTIME 10/15/15   [name], MD  potassium chloride (K-DUR) 10 MEQ tablet Take 1 tablet (10 mEq total) by mouth daily. 09/27/16   Thurnell Lose, MD  prednisoLONE  acetate (PRED FORTE) 1 % ophthalmic suspension Place 1 drop into the left eye at bedtime.  01/18/16   Historical Provider, MD     Family History  Problem Relation Age of Onset  . Heart failure Mother   . Kidney disease Mother     renal failure  . Diabetes Mother   . Diabetes Father   . Diabetes Brother   . Heart disease Brother   . Heart attack Brother   . Heart attack Brother   . Heart attack Brother   . Colon cancer Neg Hx     Social History   Social History  . Marital status: Married    Spouse name: N/A  . Number of children: N/A  . Years of education: N/A   Occupational History  . Retired Systems analyst Unemployed   Social History Main Topics  . Smoking status: Former Smoker    Packs/day: 1.00    Years: 45.00    Types: Cigarettes    Quit date: 10/24/2008  . Smokeless tobacco: Never Used  . Alcohol use No  . Drug use:  No  . Sexual activity: Not on file   Other Topics Concern  . Not on file   Social History Narrative   Lives in [place], has husband and daughter and son. Daughter's family is living with her and husband at present. She is ambulatory daily without cane or walker.     ECOG Status: 1 - Symptomatic but completely ambulatory  Review of Systems: A 12 point ROS discussed and pertinent positives are indicated in the HPI above.  All other systems are negative.  Review of Systems  Constitutional: Negative for activity change, appetite change, fatigue and fever.  Gastrointestinal: Negative for abdominal pain.    Vital Signs: BP (!) 133/54   Pulse (!) 115   Temp 97.7 F (36.5 C) (Oral)   SpO2 94%   Physical Exam  Constitutional: She appears well-developed and well-nourished.  Abdominal:    Location of the percutaneous drainage catheter.  Vitals reviewed.   Imaging:  Selected images from percutaneous drainage catheter injection performed today were reviewed with the patient and the patient's daughter.  Dg Chest 2 View  Result Date: 10/11/2016 CLINICAL DATA:  71 year old female with recent history of pneumonia. Productive cough. EXAM: CHEST  2 VIEW COMPARISON:  Chest x-ray 09/22/2016.  Chest CT 09/22/2016. FINDINGS: Again noted is a mass-like area of paramediastinal architectural distortion in the upper right hemithorax, with upward retraction of the minor fissure and the right hemidiaphragm, indicative of chronic right upper lobe volume loss and postradiation fibrosis. Lucency in the midst of this region suggests a central area of cavitation. Focal area of pleural thickening in the lateral aspect of the mid left hemithorax adjacent to multiple minimally displaced rib fractures (better demonstrated on recent chest CT). No new acute consolidative airspace disease. No pleural effusions. No evidence of pulmonary edema. Heart size is normal. IMPRESSION: 1. Treated right upper lobe  demonstrates continued contraction and volume loss related to postradiation mass-like fibrosis, as above. There continues to be a small internal area of cavitation within this region, similar to prior chest CT. No new acute findings are noted. 2. Multiple minimally displaced healing left-sided rib fractures with adjacent pleural thickening. Electronically Signed   By: Vinnie Langton M.D.   On: 10/11/2016 11:08   Ct Abdomen Pelvis W Contrast  Result Date: 10/06/2016 CLINICAL DATA:  Pelvic diverticulitis complicated by abscess. Status post percutaneous drainage 09/21/2016 EXAM: CT ABDOMEN AND PELVIS  WITH CONTRAST TECHNIQUE: Multidetector CT imaging of the abdomen and pelvis was performed using the standard protocol following bolus administration of intravenous contrast. CONTRAST:  168m ISOVUE-300 IOPAMIDOL (ISOVUE-300) INJECTION 61% COMPARISON:  09/20/2016, 09/21/2016 FINDINGS: Lower chest: New trace residual right pleural effusion. Resolved bibasilar atelectasis. Healing lateral left rib fractures. Normal heart size. Thoracic aortic atherosclerosis present. Hepatobiliary: Remote cholecystectomy. Stable 8 mm enhancing focus in the right hepatic dome anteriorly, images 16 compatible with a transient hepatic attenuation defect versus a small hemangioma. No other hepatic abnormality. No biliary dilatation. Pancreas: Unremarkable. No pancreatic ductal dilatation or surrounding inflammatory changes. Spleen: Normal in size without focal abnormality. Adrenals/Urinary Tract: Adrenal glands are unremarkable. Kidneys are normal, without renal calculi, focal lesion, or hydronephrosis. Bladder is unremarkable. Stomach/Bowel: Negative for bowel obstruction, significant dilatation, ileus, or free air. Normal appendix demonstrated. Anterior midline pelvic abscess drain is stable in position adjacent to the sigmoid colon. Previous abscess is completely drain. Diverticulitis has resolved. No new fluid collection. Extra luminal  air present at the drain catheter site suspicious for residual fistula. Vascular/Lymphatic: Aortic atherosclerosis evident. Negative for aneurysm or occlusive process. No adenopathy. Reproductive: Remote hysterectomy.  No adnexal mass or abnormality. Other: No abdominal wall hernia or abnormality. No abdominopelvic ascites. Musculoskeletal: Degenerative changes noted of the spine. No acute osseous finding. IMPRESSION: Resolved pelvic diverticular abscess, status post percutaneous drainage. Resolved diverticulitis. Small amount of extraluminal air about the sigmoid colon at the drain catheter site suspicious for residual fistula. Fluoroscopic injection to assess for fistula will also be performed today. No new fluid collection, or obstruction. Electronically Signed   By: MJerilynn Mages  Shick M.D.   On: 10/06/2016 11:43   Dg Sinus/fist Tube Chk-non Gi  Result Date: 10/06/2016 INDICATION: Pelvic diverticular abscess, status post CT percutaneous drainage 09/12/2016. Outpatient follow-up. EXAM: ABSCESS INJECTION MEDICATIONS: None.  Patient has completed antibiotics as an outpatient ANESTHESIA/SEDATION: None. COMPLICATIONS: None immediate. PROCEDURE: Under fluoroscopy, the existing midline pelvic diverticular abscess catheter was injected with contrast. This demonstrates a collapsed abscess cavity at the drain catheter site correlating with the CT. 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 Diverticulosis present. IMPRESSION: Stable drain catheter position. 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 Electronically Signed   By: MJerilynn Mages  Shick M.D.   On: 10/06/2016 11:11    Labs:  CBC:  Recent Labs  09/21/16 0331 09/22/16 0532 09/23/16 0213 09/27/16 0206  WBC 12.1* 10.4 9.7 10.3  HGB 9.6* 9.6* 9.6* 10.3*  HCT 29.2* 29.2* 29.6* 31.4*  PLT 189 211 243 314    COAGS:  Recent Labs  06/14/16 2032 09/12/16 0422 09/15/16 0450  09/21/16 0815  INR 1.20 1.34 1.29 1.12  APTT 34  --  26  --     BMP:  Recent Labs  09/21/16 0331 09/22/16 0532 09/23/16 0213 09/24/16 0220 09/27/16 0206  NA 140 139 137  --  136  K 3.2* 3.8 3.0* 4.1 3.9  CL 102 103 94*  --  100*  CO2 29 25 32  --  29  GLUCOSE 137* 102* 213*  --  157*  BUN '8 8 10  '$ --  12  CALCIUM 8.5* 8.5* 8.5*  --  9.1  CREATININE 0.87 0.85 0.99  --  0.90  GFRNONAA >60 >60 57*  --  >60  GFRAA >60 >60 >60  --  >60    LIVER FUNCTION TESTS:  Recent Labs  12/29/15 2327 06/14/16 1158 09/11/16 1030 09/12/16 0422  09/16/16 0434  BILITOT 0.3 0.9 0.5 0.7  --   AST '24 22 17 '$ 14*  --   ALT '31 19 16 '$ 13*  --   ALKPHOS 92 105 86 67  --   PROT 6.8 7.8 6.9 5.2*  --   ALBUMIN 3.6 4.0 3.5 2.6* 2.3*    Assessment and Plan:  ASYRIA KOLANDER is a 71 y.o. female with past history significant for CAD, COPD and diabetes who underwent tentatively successful percutaneous drainage catheter placement on 09/12/2016 by Dr. Kathlene Cote for a diverticular abscess. Patient was initially seen at the interventional radiology drain clinic on 10/06/2016 with CT imaging demonstrating resolution of the diverticular abscess however subsequent drainage catheter injection demonstrating a persistent fistulas connection to the adjacent sigmoid colon.  Repeat percutaneous drainage catheter injection performed today demonstrated a persistent small fistulous connection with the decompressed abscess cavity and the adjacent sigmoid colon.  Selected images from percutaneous drainage catheter injection performed today were reviewed with the patient and the patient's daughter.  - Patient was instructed to continue to NOT flush the percutaneous drainage catheter.   - She was encouraged to begin maintained diligent records regarding drainage catheter output. - The patient will return to the interventional radiology drain clinic in 2 weeks for drainage catheter evaluation and injection.  Patient was  encouraged to keep all follow-up appointments with Dr. [name].  Patient was encouraged to call the interventional radiology drain clinic with any interval questions or concerns.  A copy of this report was sent to the requesting provider on this date.  Electronically Signed: Sandi Mariscal 10/26/2016, 1:16 PM   I spent a total of 15 Minutes in face to face in clinical consultation, greater than 50% of which was counseling/coordinating care for percutaneous drainage catheter evaluation and management

## 2016-10-26 NOTE — Telephone Encounter (Signed)
Please advise. Thanks.  

## 2016-10-26 NOTE — Telephone Encounter (Signed)
I attempted to order [facility] PT/OT/nursing and I realized that I can't do this order b/c I have not seen her for f/u of her most recent illness.  Bradd Canary with [facility] is supposed to be seeing pt sometime soon (as per the [facility] social worker's note that was cc'd to me) and perhaps she can order these home health needs.  If she cannot order them, then I will need to see the patient in the office before I can order them.-thx

## 2016-10-26 NOTE — Progress Notes (Signed)
A user error has taken place: encounter opened in error, closed for administrative reasons.

## 2016-10-27 ENCOUNTER — Other Ambulatory Visit: Payer: Self-pay | Admitting: *Deleted

## 2016-10-27 NOTE — Telephone Encounter (Signed)
Staff message sent to [facility]. Waiting on response.

## 2016-10-27 NOTE — Patient Outreach (Addendum)
Transtion of care call. Pt is home at her daughter's. Home health was not ordered evidently from SNF but family called primary care MD to request and they have put in an order. I have asked that if they don't hear from them by Friday afternoon to please call me.  I have advised that I will stay in the background while home health is working with them as too many nurses coming in can be confusing for everyone. I will be calling weekly and AM ABLE to visit if necessary during home health involvement if there is a need.  She has all meds. She went to [facility] yesterday to have her abdomen looked at as she still has a drain in. She has an appt to see Dr. Anitra Lauth next Friday.  [facility] CM Care Plan Problem One   Flowsheet Row Most Recent Value  Care Plan Problem One  Abdominal wound with drain.  Role Documenting the Problem One  Care Management [place] for Problem One  Active  [facility] Long Term Goal (31-90 days)  Pt wound will be healed over the next 60 days.  [facility] Long Term Goal Start Date  10/27/16  Interventions for Problem One Long Term Goal  Verifying home health order and start date.  [facility] CM Short Term Goal #1 (0-30 days)  Pt and family will learn wound care over the next 7 days and report to me next week.  [facility] CM Short Term Goal #1 Start Date  10/27/16  Interventions for Short Term Goal #1  Asked daughter to call if home health does not contact them or arrive for services.  [facility] CM Short Term Goal #2 (0-30 days)  Pt and family will follow instructions from home health nurse on wound care and report at the  end of 30 days. [Pt and family will follow instructions from home health.]  [facility] CM Short Term Goal #2 Start Date  10/27/16       [name] [facility] [address] [place] [phone]

## 2016-10-28 ENCOUNTER — Ambulatory Visit: Payer: Self-pay | Admitting: *Deleted

## 2016-10-28 ENCOUNTER — Encounter: Payer: Self-pay | Admitting: Radiology

## 2016-10-28 NOTE — Telephone Encounter (Signed)
-----   Message from [name], NP sent at 10/28/2016  8:56 AM EST ----- Regarding: RE: [place] PT/OT Hello, Nira Conn, thank you for your note. I am not allowed to order home health, it needs to be done by Dr. Anitra Lauth. SInce she won't be seeing him until next week, I will go out and see her this afternoon to ensure all is well and trouble shoot any problems I can. Kayleen Memos ----- Message ----- From: Onalee Hua, CMA Sent: 10/27/2016  10:58 AM To: [name], NP Subject: Home Health PT/OT                              Hello Kayleen Memos,  Dr. Anitra Lauth attempted to order [facility] PT/OT/nursing and he realized that he can't do this order b/c he has not seen pt for f/u of her most recent illness.  Dr. Anitra Lauth stated that you are supposed to be seeing pt sometime soon (as per the [facility] social worker's note that was cc'd to him) and perhaps you can order these home health needs.  If you cannot order them, then Dr. Anitra Lauth will need to see the patient in the office before he can order them.   Please let us know. Thanks.  Nira Conn, CMA

## 2016-10-28 NOTE — Telephone Encounter (Signed)
Noted  

## 2016-10-28 NOTE — Telephone Encounter (Signed)
FYI.   Pt has apt to see you (Dr. Anitra Lauth) on 11/04/16 for f/u [facility].   Please advise. Thanks.

## 2016-10-31 ENCOUNTER — Other Ambulatory Visit: Payer: Self-pay | Admitting: *Deleted

## 2016-10-31 NOTE — Patient Outreach (Signed)
[facility]) Care Management   10/31/2016  [name] 1946/08/16 [id number]  [name] is an 71 y.o. female  Subjective: Initial home visit.Pt's husband and sister in law are present during the visit.  Pt has been home from having had abdominal surgery and subsequent SNF stay. She has a drain to her mid abdomen which has a small amount of grey viscous drainage, approximately 10 cc. Pt states the tube has come out some from what it was immediate post op. The puncture wound area is tender. Pt also complains of chest pain from having had CPR. She states she does not want to be resuscitated ever again.  Objective:   Review of Systems  HENT: Negative.   Eyes: Negative.   Respiratory: Positive for cough and wheezing.   Cardiovascular: Positive for chest pain.  Gastrointestinal: Positive for abdominal pain.  Genitourinary: Negative.   Skin: Negative.   Neurological: Positive for weakness.   BP (!) 100/50 (BP Location: Right Arm, Patient Position: Sitting, Cuff Size: Normal)   Pulse 86   Wt 155 lb (70.3 kg)   SpO2 93%   BMI 28.35 kg/m   Physical Exam  Constitutional: She is oriented to person, place, and time. She appears well-developed and well-nourished.  HENT:  Head: Normocephalic.  Cardiovascular: Normal rate, regular rhythm and normal heart sounds.   Respiratory: Effort normal and breath sounds normal.  GI: Soft. Bowel sounds are normal. There is tenderness. There is guarding.  Musculoskeletal: Normal range of motion.  Neurological: She is alert and oriented to person, place, and time.  Skin: Skin is warm and dry.  Psychiatric: She has a normal mood and affect.    Encounter Medications:   Outpatient Encounter Prescriptions as of 10/31/2016  Medication Sig Note  . acetaminophen (TYLENOL) 500 MG tablet Take 1,000 mg by mouth every 6 (six) hours as needed for headache (pain).   Marland Kitchen albuterol (PROAIR HFA) 108 (90 Base) MCG/ACT inhaler Inhale 2 puffs into  the lungs every 6 (six) hours as needed for wheezing or shortness of breath.   Marland Kitchen albuterol (PROVENTIL) (2.5 MG/3ML) 0.083% nebulizer solution USE ONE VIAL IN NEBULIZER EVERY 4 HOURS AS NEEDED FOR WHEEZING OR SHORTNESS OF BREATH   . atorvastatin (LIPITOR) 80 MG tablet TAKE ONE TABLET BY MOUTH ONCE DAILY (Patient taking differently: TAKE ONE TABLET BY MOUTH ONCE DAILY AT BEDTIME)   . Bromfenac Sodium (PROLENSA) 0.07 % SOLN Place 1 drop into the left eye at bedtime.   . clopidogrel (PLAVIX) 75 MG tablet Take 1 tablet (75 mg total) by mouth daily.   . feeding supplement (BOOST / RESOURCE BREEZE) LIQD Take 1 Container by mouth 2 (two) times daily between meals.   Marland Kitchen FLOVENT HFA 220 MCG/ACT inhaler INHALE TWO PUFFS BY MOUTH TWICE DAILY   . fluticasone (FLONASE) 50 MCG/ACT nasal spray Place 2 sprays into both nostrils at bedtime.   . furosemide (LASIX) 40 MG tablet Take 1 tablet (40 mg total) by mouth daily.   . insulin aspart (NOVOLOG) 100 UNIT/ML injection Before each meal 3 times a day, 140-199 - 2 units, 200-250 - 4 units, 251-299 - 6 units,  300-349 - 8 units,  350 or above 10 units. Dispense syringes and needles as needed, Ok to switch to PEN if approved. Substitute to any brand approved. DX DM2, Code E11.65   . lisinopril (PRINIVIL,ZESTRIL) 2.5 MG tablet Take 1 tablet (2.5 mg total) by mouth 2 (two) times daily.   Marland Kitchen loratadine (CLARITIN)  10 MG tablet Take 10 mg by mouth at bedtime.    . Metoprolol Tartrate 37.5 MG TABS Take 37.5 mg by mouth 2 (two) times daily.   . nitroGLYCERIN (NITROSTAT) 0.4 MG SL tablet Place 1 tablet (0.4 mg total) under the tongue every 5 (five) minutes as needed. For chest pain   . pantoprazole (PROTONIX) 40 MG tablet TAKE ONE TABLET BY MOUTH ONCE DAILY (Patient taking differently: TAKE ONE TABLET BY MOUTH ONCE DAILY AT BEDTIME)   . potassium chloride (K-DUR) 10 MEQ tablet Take 1 tablet (10 mEq total) by mouth daily.   . prednisoLONE acetate (PRED FORTE) 1 % ophthalmic  suspension Place 1 drop into the left eye at bedtime.  09/11/2016: Prescribed 4 times daily but pt only takes at bedtime  . amoxicillin-clavulanate (AUGMENTIN) 875-125 MG tablet Take 1 tablet by mouth every 12 (twelve) hours. 5 more days. Must follow with the drain clinic and Gen. surgery before stopping this medication (Patient not taking: Reported on 10/27/2016)    No facility-administered encounter medications on file as of 10/31/2016.     Functional Status:   In your present state of health, do you have any difficulty performing the following activities: 10/04/2016 09/11/2016  Hearing? N -  Vision? N -  Difficulty concentrating or making decisions? N -  Walking or climbing stairs? N -  Dressing or bathing? N -  Doing errands, shopping? N N  Preparing Food and eating ? N -  Using the Toilet? N -  In the past six months, have you accidently leaked urine? N -  Do you have problems with loss of bowel control? N -  Managing your Medications? N -  Managing your Finances? N -  Housekeeping or managing your Housekeeping? Y -  Some recent data might be hidden    Fall/Depression Screening:    PHQ 2/9 Scores 10/04/2016 07/05/2016 06/28/2016 04/06/2015 04/06/2015 10/04/2013  PHQ - 2 Score 1 0 0 0 0 0    Assessment:  Abdominal discomfort: surgical                           COPD                          No Advanced Directives.  Plan:  Assisted pt to complete a MOST form. (Pt husband was very irritable and hostile during the visit, annoyed that I was asking the same questions someone else asked and annoyed that I wanted to see their advanced directives. Only Mr. Cranmer, living will and HCPOA were found after 10 minutes of searching. I encouraged filling out a MOST form to be posted in the home which [name] did but Mr. Toste complained the whole time. I also offered him a MOST form and finally at the end of the visit he consented to also have one.  Called surgeon office to report drainage tube  findings and to request an office visit to evaluate her wound and drainage tube. Pt will be seen Thursday this week.  I will call her next week.  If I visit their home again, I will ask to visit with [name] in private.  [name] [facility] [phone]

## 2016-11-01 IMAGING — CT CT ABD-PELV W/ CM
2 of 5 series · 17 of 46 positions shown, 19 images · IV contrast (APPLIED)
Comparison: 02/03/2013

CLINICAL DATA: Right lower quadrant pain, diarrhea since yesterday

EXAM:
CT ABDOMEN AND PELVIS WITH CONTRAST
TECHNIQUE: Multidetector CT imaging of the abdomen and pelvis was performed
using the standard protocol following bolus administration of
intravenous contrast.
CONTRAST:  75mL OMNIPAQUE IOHEXOL 300 MG/ML  SOLN

[Series 2: abd/ pelvis 5.0 i30f 1 · axial · 0.77mm/px · z∈[-576,-161]mm · 14 of 93 slices shown, 16 images]
[im 5/93  soft-tissue]
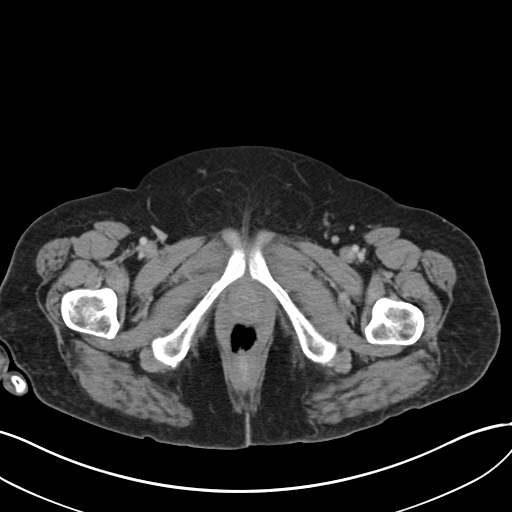
[im 5/93  bone]
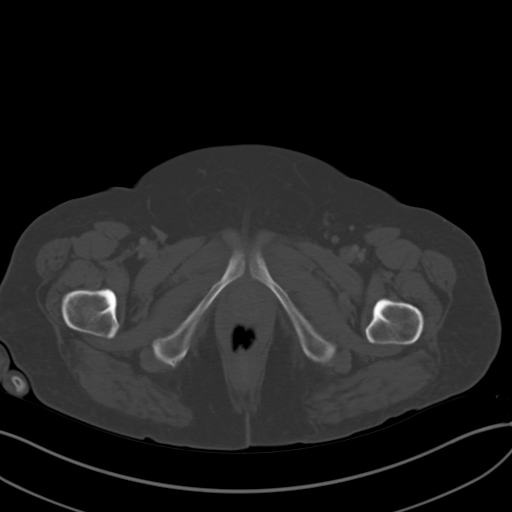
[im 14/93  soft-tissue]
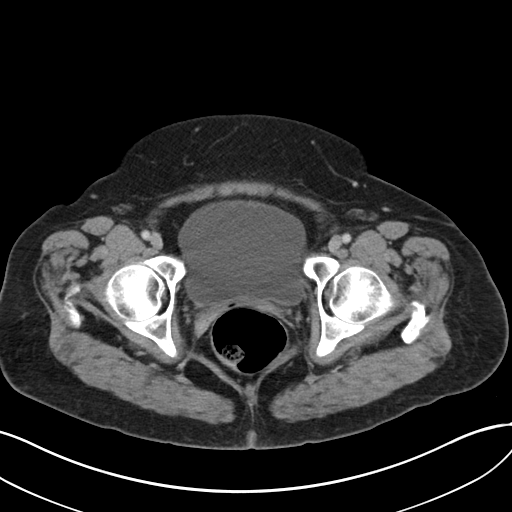
[im 18/93  soft-tissue]
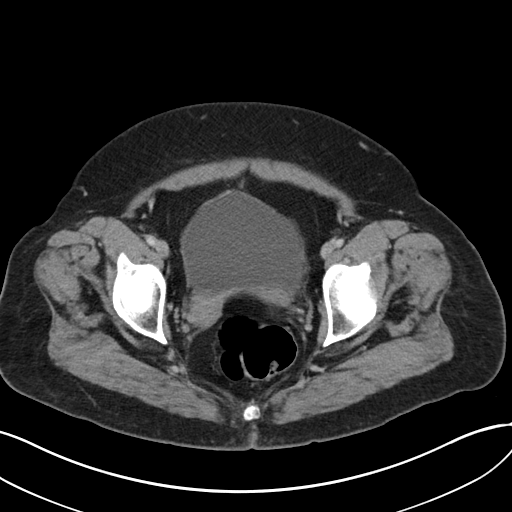
[im 27/93  soft-tissue]
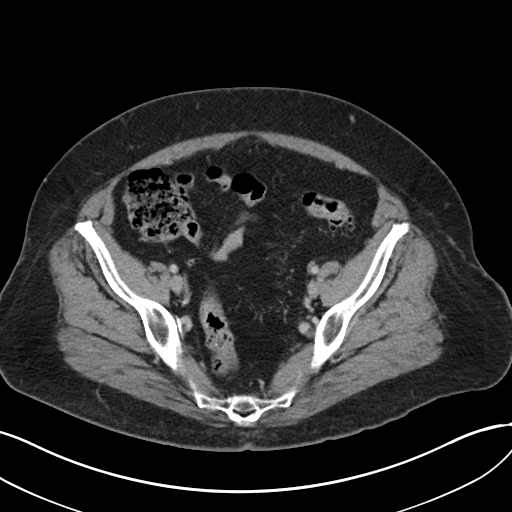
[im 31/93  soft-tissue]
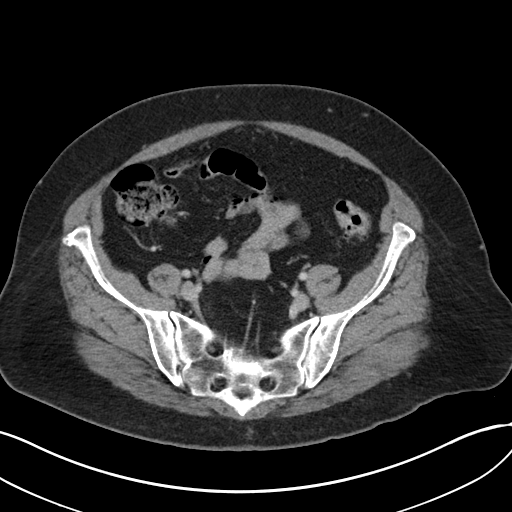
[im 36/93  soft-tissue]
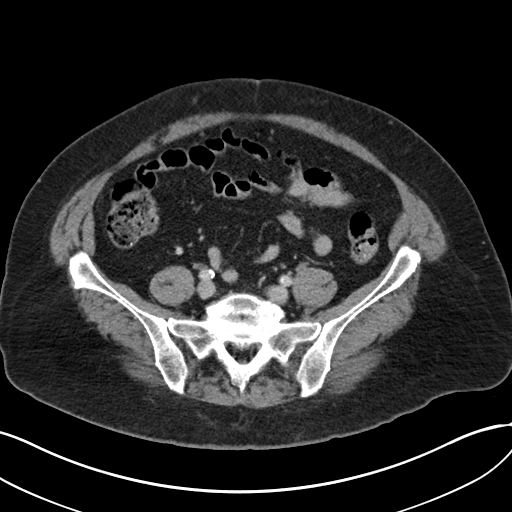
[im 44/93  soft-tissue]
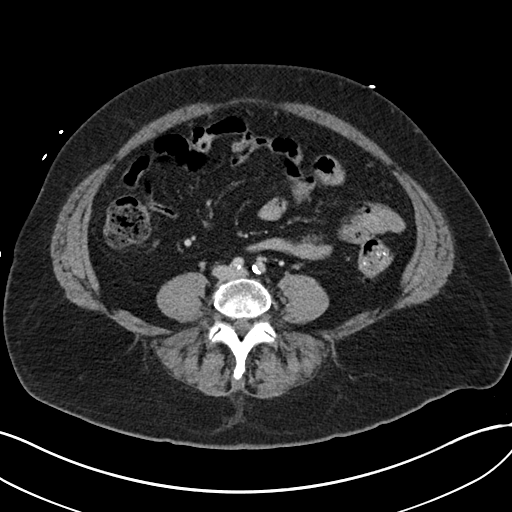
[im 49/93  soft-tissue]
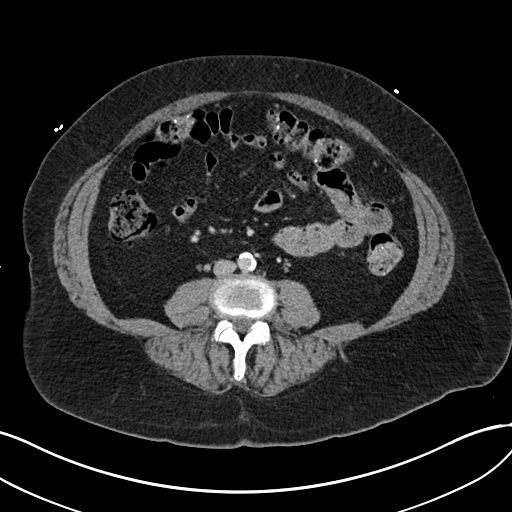
[im 57/93  soft-tissue]
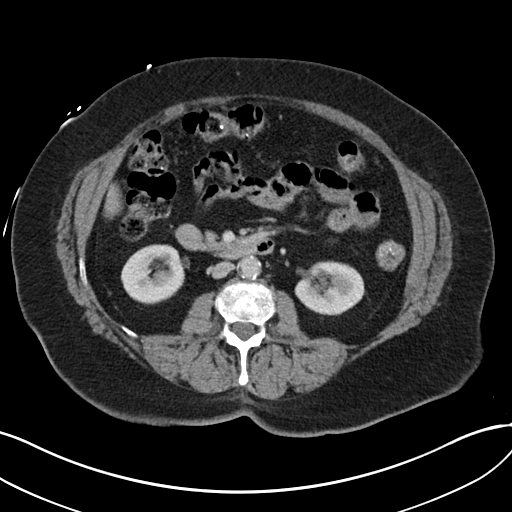
[im 57/93  bone]
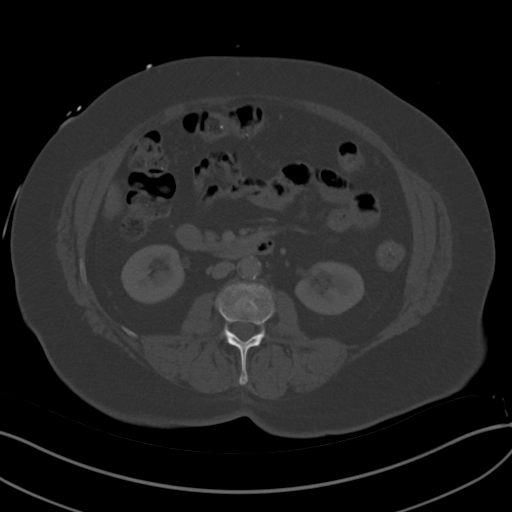
[im 62/93  soft-tissue]
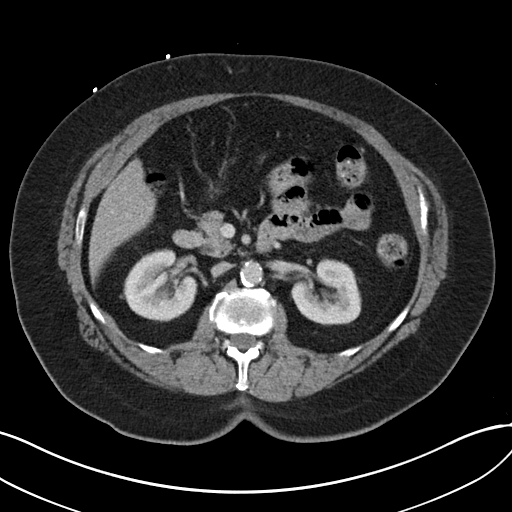
[im 71/93  soft-tissue]
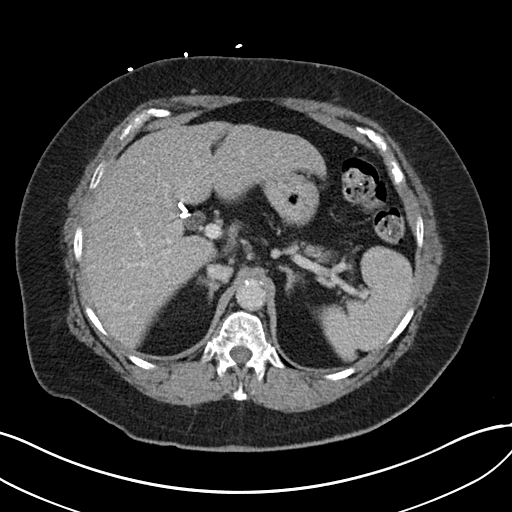
[im 75/93  soft-tissue]
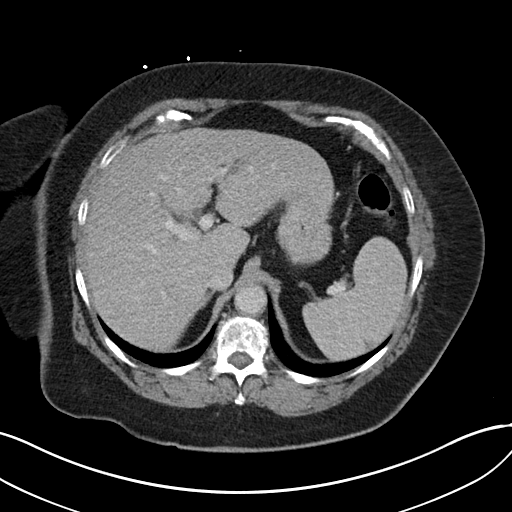
[im 79/93  soft-tissue]
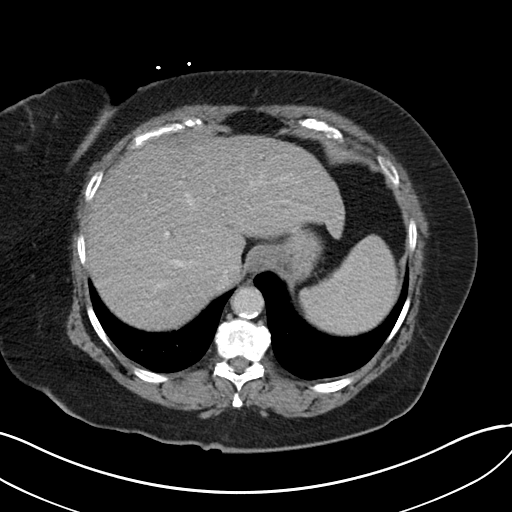
[im 88/93  soft-tissue]
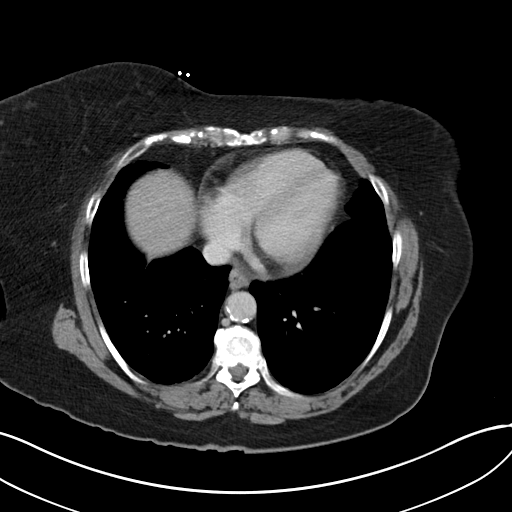

[Series 6: coronal soft tissue · coronal · 0.94mm/px · 3 of 101 slices shown]
[im 34/101  soft-tissue]
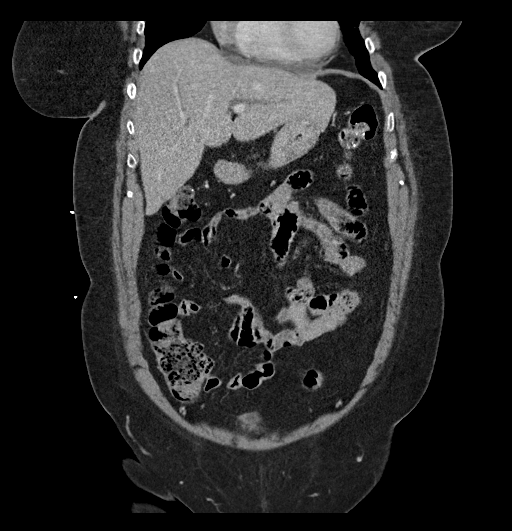
[im 45/101  soft-tissue]
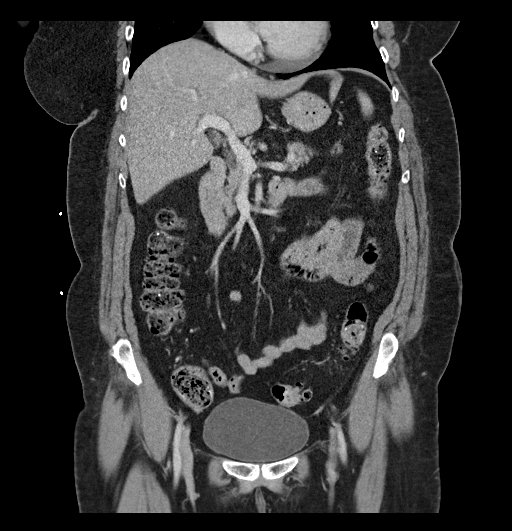
[im 56/101  soft-tissue]
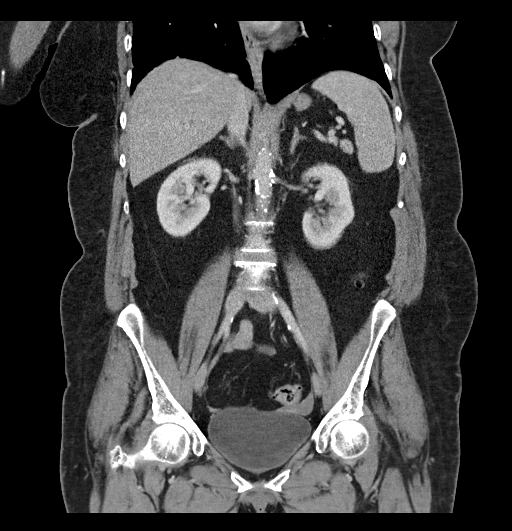

[17 of 46 positions shown; findings below may reference images not displayed]

FINDINGS: Lower chest: Scarring in the lingula. Otherwise, Lung bases are
clear. No effusions. Heart is normal size.

Hepatobiliary: Mild diffuse fatty infiltration of the liver without
focal abnormality. Prior cholecystectomy.

Pancreas: Normal

Spleen: Normal

Adrenals/Urinary Tract: Normal

Stomach/Bowel: Appendix is normal. Moderate sigmoid diverticulosis.
No active diverticulitis. Stomach and small bowel are decompressed
and unremarkable.

Vascular/Lymphatic: Aortic and iliac calcifications without
aneurysm. No adenopathy.

Reproductive: No mass or visible abnormality.  Prior hysterectomy.

Other: No free fluid or free air.

Musculoskeletal: No acute bony abnormality. Degenerative changes in
the lower lumbar spine.
IMPRESSION: Normal appendix.

Sigmoid diverticulosis.

Fatty liver.

No acute findings.

## 2016-11-03 ENCOUNTER — Ambulatory Visit
Admission: RE | Admit: 2016-11-03 | Discharge: 2016-11-03 | Disposition: A | Discharge status: Home / Self Care | Payer: Medicare Other | Source: Ambulatory Visit | Attending: General Surgery | Admitting: General Surgery

## 2016-11-03 ENCOUNTER — Other Ambulatory Visit: Payer: Self-pay | Admitting: General Surgery

## 2016-11-03 DIAGNOSIS — K5780 Diverticulitis of intestine, part unspecified, with perforation and abscess without bleeding: Secondary | ICD-10-CM | POA: Diagnosis not present

## 2016-11-03 DIAGNOSIS — K5720 Diverticulitis of large intestine with perforation and abscess without bleeding: Secondary | ICD-10-CM | POA: Diagnosis not present

## 2016-11-03 HISTORY — PX: IR GENERIC HISTORICAL: IMG1180011

## 2016-11-03 NOTE — Progress Notes (Signed)
Pre visit review using our clinic review tool, if applicable. No additional management support is needed unless otherwise documented below in the visit note. 

## 2016-11-03 NOTE — Progress Notes (Signed)
Chief Complaint: Diverticular abscess S/P Perc Drain by Dr. Kathlene Cote 09/12/2016  Referring Physician(s): Ramirez,Armando  Supervising Physician: Arne Cleveland  History of Present Illness: [name] is a 71 y.o. female who underwent tentatively successful percutaneous drainage catheter placement on 09/12/2016 by Dr. Kathlene Cote for a diverticular abscess.   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   She returned again on 10/26/2016 for injection which again revealed persistent fistula.  She returns again today for another injection. She returns earlier than expected and she isn't sure why.  She is a very poor historian and answers "I don't know" to every question.  She is accompanied by her sister who really is no help either.  Neither can quantify the drainage. They are NOT flushing the drain as instructed at last visit.  She does deny abdominal pain, fever/chills.   Past Medical History:  Diagnosis Date  . Age-related nuclear cataract of both eyes 2016   +cortical age related cataracts OU   . Allergy    seasonal  . Arthritis    hands  . Bilateral diabetic retinopathy (HCC) 2015   Dr. Zigmund Daniel  . Blood transfusion without reported diagnosis    when taking chemo  . CAD (coronary artery disease)    NON obstructive. Cath 2006 preserved LV fxn, scattered irregularities without critical stenosis. 2008 stress echo negative for ischemia, but with hypertensive response  . COPD (chronic obstructive pulmonary disease) ([place])   . Diabetes mellitus with complication (HCC)    diab retinopathy OU (laser)  . Diverticulitis 05/2016  . GERD (gastroesophageal reflux disease)    protonix  . History of diverticulitis of colon    with abscess;  required IR percutaneous drain placed 09/2016.  Marland Kitchen Hyperlipidemia   . Hypertension   . Lung cancer ([place])    non-small cell lung ca, stage III in 05/2011; systemic chemotherapy concurrent with radiation followed by prophylactic cranial irradiation and has been observation since July of 2010 with no evidence for disease recurrence-released from onc f/u 12/2014 (needs annual cxr by PCP).  CXR 08/2015 stable.  . Mild cognitive impairment with memory loss    Likely from brain radiation therapy  . Open-angle glaucoma 2016   Dr. Shirley Muscat, (bilateral)---responding to topical therapy  . Osteopenia 03/2016   03/2016 DEXA T-score -2.2  . Torsades de pointes ([place]) 08/2016   avoid meds with potential for QT prolongation    Past Surgical History:  Procedure Laterality Date  . ABDOMINAL HYSTERECTOMY  1997  . APPENDECTOMY    . CARDIAC CATHETERIZATION N/A 09/14/2016   Procedure: Left Heart Cath and Coronary Angiography;  Surgeon: Troy Sine, MD;  Location: [facility];  Service: Cardiovascular;  Laterality: N/A;  . CARDIAC CATHETERIZATION  09/14/2016   EF 25%, akinesis in a pattern of [place] CM.  Marland Kitchen CARPAL TUNNEL RELEASE    . CATARACT EXTRACTION    . CHOLECYSTECTOMY  2002  . COLONOSCOPY  10/24/00   normal.  [employer] hemoccult testing via [place] 06/18/15 was NEG  . IR GENERIC HISTORICAL  09/19/2016   IR SINUS/FIST TUBE CHK-NON GI 09/19/2016 Corrie Mckusick, DO [facility]  . IR GENERIC HISTORICAL  10/06/2016   IR RADIOLOGIST EVAL & MGMT 10/06/2016 [facility]  . IR GENERIC HISTORICAL  10/26/2016   IR RADIOLOGIST EVAL & MGMT 10/26/2016  [facility]  . IR GENERIC HISTORICAL  11/03/2016   IR RADIOLOGIST EVAL & MGMT 11/03/2016 Ardis Rowan, PA-C [facility]  . LEFT HEART CATHETERIZATION WITH CORONARY ANGIOGRAM N/A 05/30/2012   Procedure: LEFT HEART CATHETERIZATION WITH CORONARY ANGIOGRAM;  Surgeon: Burnell Blanks, MD;  Location: [facility];  Service: Cardiovascular;   Laterality: N/A;  . TRANSTHORACIC ECHOCARDIOGRAM  09/14/2016   EF 30-35 %, Akinesis of the mid-apicalanteroseptal myocardium.  Grade I DD.  Mild pulm HTN.    Allergies: Lactose intolerance (gi) and Ibuprofen  Medications: Prior to Admission medications   Medication Sig Start Date End Date Taking? Authorizing Provider  acetaminophen (TYLENOL) 500 MG tablet Take 1,000 mg by mouth every 6 (six) hours as needed for headache (pain).    Historical Provider, MD  albuterol (PROAIR HFA) 108 (90 Base) MCG/ACT inhaler Inhale 2 puffs into the lungs every 6 (six) hours as needed for wheezing or shortness of breath.    Historical Provider, MD  albuterol (PROVENTIL) (2.5 MG/3ML) 0.083% nebulizer solution USE ONE VIAL IN NEBULIZER EVERY 4 HOURS AS NEEDED FOR WHEEZING OR SHORTNESS OF BREATH 12/16/15   [name], MD  amoxicillin-clavulanate (AUGMENTIN) 875-125 MG tablet Take 1 tablet by mouth every 12 (twelve) hours. 5 more days. Must follow with the drain clinic and Gen. surgery before stopping this medication Patient not taking: Reported on 10/27/2016 09/27/16   Thurnell Lose, MD  atorvastatin (LIPITOR) 80 MG tablet TAKE ONE TABLET BY MOUTH ONCE DAILY Patient taking differently: TAKE ONE TABLET BY MOUTH ONCE DAILY AT BEDTIME 07/18/16   [name], MD  Bromfenac Sodium (PROLENSA) 0.07 % SOLN Place 1 drop into the left eye at bedtime.    Historical Provider, MD  clopidogrel (PLAVIX) 75 MG tablet Take 1 tablet (75 mg total) by mouth daily. 09/28/16   Thurnell Lose, MD  feeding supplement (BOOST / RESOURCE BREEZE) LIQD Take 1 Container by mouth 2 (two) times daily between meals. 09/24/16   Thurnell Lose, MD  FLOVENT HFA 220 MCG/ACT inhaler INHALE TWO PUFFS BY MOUTH TWICE DAILY 01/12/16   [name], MD  fluticasone (FLONASE) 50 MCG/ACT nasal spray Place 2 sprays into both nostrils at bedtime. 06/25/14   [name], MD  furosemide (LASIX) 40 MG tablet Take 1 tablet (40 mg total) by mouth  daily. 09/28/16   Thurnell Lose, MD  insulin aspart (NOVOLOG) 100 UNIT/ML injection Before each meal 3 times a day, 140-199 - 2 units, 200-250 - 4 units, 251-299 - 6 units,  300-349 - 8 units,  350 or above 10 units. Dispense syringes and needles as needed, Ok to switch to PEN if approved. Substitute to any brand approved. DX DM2, Code E11.65 09/24/16   Thurnell Lose, MD  lisinopril (PRINIVIL,ZESTRIL) 2.5 MG tablet Take 1 tablet (2.5 mg total) by mouth 2 (two) times daily. 09/24/16   Thurnell Lose, MD  loratadine (CLARITIN) 10 MG tablet Take 10 mg by mouth at bedtime.     Historical Provider, MD  Metoprolol Tartrate 37.5 MG TABS Take 37.5 mg by mouth 2 (two) times daily. 09/24/16   Thurnell Lose, MD  nitroGLYCERIN (NITROSTAT) 0.4 MG SL tablet Place 1 tablet (0.4 mg total) under the tongue every 5 (five) minutes as needed. For chest pain 12/31/12   Lisabeth Pick, MD  pantoprazole (PROTONIX) 40 MG tablet TAKE ONE TABLET BY MOUTH ONCE DAILY Patient taking differently: TAKE ONE TABLET BY MOUTH ONCE  DAILY AT BEDTIME 10/15/15   [name], MD  potassium chloride (K-DUR) 10 MEQ tablet Take 1 tablet (10 mEq total) by mouth daily. 09/27/16   Thurnell Lose, MD  prednisoLONE acetate (PRED FORTE) 1 % ophthalmic suspension Place 1 drop into the left eye at bedtime.  01/18/16   Historical Provider, MD     Family History  Problem Relation Age of Onset  . Heart failure Mother   . Kidney disease Mother     renal failure  . Diabetes Mother   . Diabetes Father   . Diabetes Brother   . Heart disease Brother   . Heart attack Brother   . Heart attack Brother   . Heart attack Brother   . Colon cancer Neg Hx     Social History   Social History  . Marital status: Married    Spouse name: N/A  . Number of children: N/A  . Years of education: N/A   Occupational History  . Retired Systems analyst Unemployed   Social History Main Topics  . Smoking status: Former Smoker    Packs/day: 1.00     Years: 45.00    Types: Cigarettes    Quit date: 10/24/2008  . Smokeless tobacco: Never Used  . Alcohol use No  . Drug use: No  . Sexual activity: Not on file   Other Topics Concern  . Not on file   Social History Narrative   Lives in [place], has husband and daughter and son. Daughter's family is living with her and husband at present. She is ambulatory daily without cane or walker.     Review of Systems  Unable to perform ROS: Mental status change    Vital Signs: BP (!) 109/55   Pulse 91   Temp 97.7 F (36.5 C) (Oral)   SpO2 97%   Physical Exam  Constitutional: She appears well-developed and well-nourished.  Abdominal: Soft. She exhibits no distension. There is no tenderness.  Drain in place anterior abdomen. Site clean, a little pink inflamed skin but no signs of infection.  Vitals reviewed.   Imaging: Dg Chest 2 View  Result Date: 10/11/2016 CLINICAL DATA:  71 year old female with recent history of pneumonia. Productive cough. EXAM: CHEST  2 VIEW COMPARISON:  Chest x-ray 09/22/2016.  Chest CT 09/22/2016. FINDINGS: Again noted is a mass-like area of paramediastinal architectural distortion in the upper right hemithorax, with upward retraction of the minor fissure and the right hemidiaphragm, indicative of chronic right upper lobe volume loss and postradiation fibrosis. Lucency in the midst of this region suggests a central area of cavitation. Focal area of pleural thickening in the lateral aspect of the mid left hemithorax adjacent to multiple minimally displaced rib fractures (better demonstrated on recent chest CT). No new acute consolidative airspace disease. No pleural effusions. No evidence of pulmonary edema. Heart size is normal. IMPRESSION: 1. Treated right upper lobe demonstrates continued contraction and volume loss related to postradiation mass-like fibrosis, as above. There continues to be a small internal area of cavitation within this region, similar to prior  chest CT. No new acute findings are noted. 2. Multiple minimally displaced healing left-sided rib fractures with adjacent pleural thickening. Electronically Signed   By: Vinnie Langton M.D.   On: 10/11/2016 11:08   Ct Abdomen Pelvis W Contrast  Result Date: 10/06/2016 CLINICAL DATA:  Pelvic diverticulitis complicated by abscess. Status post percutaneous drainage 09/21/2016 EXAM: CT ABDOMEN AND PELVIS WITH CONTRAST TECHNIQUE: Multidetector CT imaging of the abdomen and  pelvis was performed using the standard protocol following bolus administration of intravenous contrast. CONTRAST:  141m ISOVUE-300 IOPAMIDOL (ISOVUE-300) INJECTION 61% COMPARISON:  09/20/2016, 09/21/2016 FINDINGS: Lower chest: New trace residual right pleural effusion. Resolved bibasilar atelectasis. Healing lateral left rib fractures. Normal heart size. Thoracic aortic atherosclerosis present. Hepatobiliary: Remote cholecystectomy. Stable 8 mm enhancing focus in the right hepatic dome anteriorly, images 16 compatible with a transient hepatic attenuation defect versus a small hemangioma. No other hepatic abnormality. No biliary dilatation. Pancreas: Unremarkable. No pancreatic ductal dilatation or surrounding inflammatory changes. Spleen: Normal in size without focal abnormality. Adrenals/Urinary Tract: Adrenal glands are unremarkable. Kidneys are normal, without renal calculi, focal lesion, or hydronephrosis. Bladder is unremarkable. Stomach/Bowel: Negative for bowel obstruction, significant dilatation, ileus, or free air. Normal appendix demonstrated. Anterior midline pelvic abscess drain is stable in position adjacent to the sigmoid colon. Previous abscess is completely drain. Diverticulitis has resolved. No new fluid collection. Extra luminal air present at the drain catheter site suspicious for residual fistula. Vascular/Lymphatic: Aortic atherosclerosis evident. Negative for aneurysm or occlusive process. No adenopathy. Reproductive:  Remote hysterectomy.  No adnexal mass or abnormality. Other: No abdominal wall hernia or abnormality. No abdominopelvic ascites. Musculoskeletal: Degenerative changes noted of the spine. No acute osseous finding. IMPRESSION: Resolved pelvic diverticular abscess, status post percutaneous drainage. Resolved diverticulitis. Small amount of extraluminal air about the sigmoid colon at the drain catheter site suspicious for residual fistula. Fluoroscopic injection to assess for fistula will also be performed today. No new fluid collection, or obstruction. Electronically Signed   By: MJerilynn Mages  Shick M.D.   On: 10/06/2016 11:43   Dg Sinus/fist Tube Chk-non Gi  Result Date: 10/26/2016 CLINICAL DATA:  History of pelvic diverticular abscess, post CT-guided percutaneous drainage catheter placement 09/12/2016. Patient was initially seen in interventional radiology drain Clinic on 10/06/2016 (by Dr. YKathlene Cote with CT imaging demonstrating resolution of the pelvic abscess though subsequent drainage catheter injection demonstrated persistent fistulous communication with the adjacent sigmoid colon. The patient returns today in interventional radiology drain Clinic for drainage catheter evaluation and management. The patient is no longer flushing the percutaneous drainage catheter. The patient reports approximately 10 cc of output per day though was not maintain diligent records. No worsening abdominal or pelvic pain. No fever or chills. EXAM: ABSCESS INJECTION COMPARISON:  CT abdomen and pelvis- 10/06/2016; 09/11/2016; CT-guided left lower quadrant percutaneous drainage catheter placement -09/12/2016 CONTRAST:  10 cc Omnipaque 300 - administered via the percutaneous drainage catheter. FLUOROSCOPY TIME:  30 seconds (67 mGy) TECHNIQUE: The patient was positioned supine on the fluoroscopy table. A preprocedural spot fluoroscopic image was obtained of the lower pelvis and existing percutaneous drainage catheter and existing percutaneous  drainage catheter Multiple spot fluoroscopic and radiographic images were obtained following the injection of a small amount of contrast via the existing percutaneous drainage catheter. Images reviewed in the procedure was terminated. The catheter was flushed with a small amount of saline and reconnected to a gravity bag. FINDINGS: Preprocedural spot fluoroscopic image demonstrates unchanged positioning of percutaneous drainage catheter with and coiled and locked over the midline of the lower pelvis. Contrast injection demonstrates opacification of the decompressed abscess cavity with brisk communication with the adjacent sigmoid colon. IMPRESSION: Persistent fistulous connection of the decompressed pelvic abscess cavity and the adjacent sigmoid colon. PLAN: - Percutaneous drainage catheter to remain in place. The patient was again instructed to not flush the percutaneous drainage catheter though was encouraged to maintain diligent records regarding drainage catheter output. - The patient return to  the interventional radiology clinic in 2 weeks with repeat percutaneous drainage catheter evaluation and injection. Electronically Signed   By: Sandi Mariscal M.D.   On: 10/26/2016 13:16   Dg Sinus/fist Tube Chk-non Gi  Result Date: 10/06/2016 INDICATION: Pelvic diverticular abscess, status post CT percutaneous drainage 09/12/2016. Outpatient follow-up. EXAM: ABSCESS INJECTION MEDICATIONS: None.  Patient has completed antibiotics as an outpatient ANESTHESIA/SEDATION: None. COMPLICATIONS: None immediate. PROCEDURE: Under fluoroscopy, the existing midline pelvic diverticular abscess catheter was injected with contrast. This demonstrates a collapsed abscess cavity at the drain catheter site correlating with the CT. 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 Diverticulosis present. IMPRESSION: Stable drain catheter position. Collapsed resolved pelvic abscess Small patent fistula from the collapsed abscess site to  the adjacent sigmoid colon. Electronically Signed   By: Jerilynn Mages.  Shick M.D.   On: 10/06/2016 11:11   Ir Radiologist Eval & Mgmt  Result Date: 11/03/2016 Please refer to "Notes" to see consult details.  Ir Radiologist Eval & Mgmt  Result Date: 10/28/2016 Please refer to "Notes" to see consult details.  Ir Radiologist Eval & Mgmt  Result Date: 10/28/2016 Please refer to "Notes" to see consult details.   Labs:  CBC:  Recent Labs  09/21/16 0331 09/22/16 0532 09/23/16 0213 09/27/16 0206  WBC 12.1* 10.4 9.7 10.3  HGB 9.6* 9.6* 9.6* 10.3*  HCT 29.2* 29.2* 29.6* 31.4*  PLT 189 211 243 314    COAGS:  Recent Labs  06/14/16 2032 09/12/16 0422 09/15/16 0450 09/21/16 0815  INR 1.20 1.34 1.29 1.12  APTT 34  --  26  --     BMP:  Recent Labs  09/21/16 0331 09/22/16 0532 09/23/16 0213 09/24/16 0220 09/27/16 0206  NA 140 139 137  --  136  K 3.2* 3.8 3.0* 4.1 3.9  CL 102 103 94*  --  100*  CO2 29 25 32  --  29  GLUCOSE 137* 102* 213*  --  157*  BUN '8 8 10  '$ --  12  CALCIUM 8.5* 8.5* 8.5*  --  9.1  CREATININE 0.87 0.85 0.99  --  0.90  GFRNONAA >60 >60 57*  --  >60  GFRAA >60 >60 >60  --  >60    LIVER FUNCTION TESTS:  Recent Labs  12/29/15 2327 06/14/16 1158 09/11/16 1030 09/12/16 0422 09/16/16 0434  BILITOT 0.3 0.9 0.5 0.7  --   AST '24 22 17 '$ 14*  --   ALT '31 19 16 '$ 13*  --   ALKPHOS 92 105 86 67  --   PROT 6.8 7.8 6.9 5.2*  --   ALBUMIN 3.6 4.0 3.5 2.6* 2.3*    TUMOR MARKERS: No results for input(s): AFPTM, CEA, CA199, CHROMGRNA in the last 8760 hours.  Assessment:  S/P percutaneous drainage catheter placement on 09/12/2016 by Dr. Kathlene Cote for a diverticular abscess.  Dr. Vernard Gambles reviewed images today.  Repeat percutaneous drainage catheter injection performed today demonstrated a persistent small fistulous connection with the decompressed abscess cavity and the adjacent sigmoid colon.    - Patient was instructed to resume flushing the percutaneous  drainage catheter with only 3 ml per day   - She was encouraged to begin maintained diligent records regarding drainage catheter output although with her mental status I doubt she will do this.  - She will return to drain clinic in 3 weeks for drainage catheter evaluation and injection.  I have reached out to [name], NP and she will visit the patient and teach the daughter  how to do the drain injections. Order has been faxed to her.   Electronically Signed: Murrell Redden PA-C 11/03/2016, 9:18 AM   Please refer to Dr. Adron Bene attestation of this note for management and plan.

## 2016-11-04 ENCOUNTER — Encounter: Payer: Self-pay | Admitting: Family Medicine

## 2016-11-04 ENCOUNTER — Ambulatory Visit (INDEPENDENT_AMBULATORY_CARE_PROVIDER_SITE_OTHER): Payer: Medicare Other | Admitting: Family Medicine

## 2016-11-04 VITALS — BP 100/56 | HR 98 | Temp 97.9°F | Resp 16 | Ht 61.5 in | Wt 148.2 lb

## 2016-11-04 DIAGNOSIS — R5381 Other malaise: Secondary | ICD-10-CM

## 2016-11-04 DIAGNOSIS — E118 Type 2 diabetes mellitus with unspecified complications: Secondary | ICD-10-CM

## 2016-11-04 DIAGNOSIS — I5181 Takotsubo syndrome: Secondary | ICD-10-CM

## 2016-11-04 DIAGNOSIS — K5720 Diverticulitis of large intestine with perforation and abscess without bleeding: Secondary | ICD-10-CM

## 2016-11-04 LAB — CBC WITH DIFFERENTIAL/PLATELET
Basophils Absolute: 0 10*3/uL (ref 0.0–0.1)
Basophils Relative: 0.3 % (ref 0.0–3.0)
EOS PCT: 1.4 % (ref 0.0–5.0)
Eosinophils Absolute: 0.1 10*3/uL (ref 0.0–0.7)
HCT: 36.7 % (ref 36.0–46.0)
Hemoglobin: 12.4 g/dL (ref 12.0–15.0)
LYMPHS ABS: 1.5 10*3/uL (ref 0.7–4.0)
Lymphocytes Relative: 15.8 % (ref 12.0–46.0)
MCHC: 33.9 g/dL (ref 30.0–36.0)
MCV: 87 fl (ref 78.0–100.0)
MONOS PCT: 9 % (ref 3.0–12.0)
Monocytes Absolute: 0.9 10*3/uL (ref 0.1–1.0)
NEUTROS ABS: 6.9 10*3/uL (ref 1.4–7.7)
NEUTROS PCT: 73.5 % (ref 43.0–77.0)
PLATELETS: 301 10*3/uL (ref 150.0–400.0)
RBC: 4.21 Mil/uL (ref 3.87–5.11)
RDW: 16.2 % — ABNORMAL HIGH (ref 11.5–15.5)
WBC: 9.4 10*3/uL (ref 4.0–10.5)

## 2016-11-04 LAB — COMPREHENSIVE METABOLIC PANEL
ALT: 14 U/L (ref 0–35)
AST: 13 U/L (ref 0–37)
Albumin: 4 g/dL (ref 3.5–5.2)
Alkaline Phosphatase: 137 U/L — ABNORMAL HIGH (ref 39–117)
BILIRUBIN TOTAL: 0.5 mg/dL (ref 0.2–1.2)
BUN: 10 mg/dL (ref 6–23)
CO2: 28 mEq/L (ref 19–32)
Calcium: 9.1 mg/dL (ref 8.4–10.5)
Chloride: 100 mEq/L (ref 96–112)
Creatinine, Ser: 0.93 mg/dL (ref 0.40–1.20)
GFR: 63.65 mL/min (ref >60.00)
GLUCOSE: 227 mg/dL — AB (ref 70–99)
POTASSIUM: 3.7 meq/L (ref 3.5–5.1)
Sodium: 138 mEq/L (ref 135–145)
Total Protein: 6.5 g/dL (ref 6.0–8.3)

## 2016-11-04 NOTE — Progress Notes (Signed)
OFFICE VISIT  11/04/2016   CC:  Chief Complaint  Patient presents with  . Follow-up    RCI, pt is not fasting.    HPI:    Patient is a 71 y.o. Caucasian female who presents for follow up, sister here with her today. I last saw her 09/07/16 for COPD exacerbation. She was then admitted 11/19-12/5, 2017 for diverticulitis with abscess.  She had IR drain placed 09/12/16 and was improving slowly until 2016/10/02 when she coded and then was successfully resuscitated.  She was extubated 11/23 and then found to have severe cardiomyopathy, EF 25%.  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 She was d/c'd to skilled nursing facility where she got PT/OT. She was then d/c'd home from skilled nursing facility about 10 days ago , where her therapies were not continued.  Daughter and husband are living with her.  Daughter is helping her with meds, meals, etc.  She has plans to go to IR for IR drain study 11/24/16.  Drain bag was replaced yesterday. She has appt for cardiology f/u 11/23/16.  At rest, she feels no SOB.  She walks with a rolling walker or cain and admits to feeling winded with about 15-20 steps.  She does note she has regressed regarding energy and ability to walk well since going home from skilled nursing facility.  She does feel off balance and staggers when walking, so she requires cain or walker.   She is eating but says she is not very hungry. Pain: has some pain where her drain is, otherwise no abdominal pain.   Checking glucoses twice a day: was 143 this morning.  All others in 150s-160s on current 70/30 dosing.   Past Medical History:  Diagnosis Date  . Age-related nuclear cataract of both eyes 2016   +cortical age related cataracts OU   . Allergy    seasonal  . Arthritis    hands  . Bilateral diabetic retinopathy (HCC) 2015   Dr. Zigmund Daniel  . Blood transfusion without reported diagnosis    when taking chemo  . CAD (coronary artery disease)     NON obstructive. Cath 2006 preserved LV fxn, scattered irregularities without critical stenosis. 2008 stress echo negative for ischemia, but with hypertensive response  . COPD (chronic obstructive pulmonary disease) ([place])   . Diabetes mellitus with complication (HCC)    diab retinopathy OU (laser)  . Diverticulitis 05/2016  . GERD (gastroesophageal reflux disease)    protonix  . History of diverticulitis of colon    with abscess; required IR percutaneous drain placed 09/2016.  Marland Kitchen Hyperlipidemia   . Hypertension   . Lung cancer ([place])    non-small cell lung ca, stage III in 05/2011; systemic chemotherapy concurrent with radiation followed by prophylactic cranial irradiation and has been observation since July of 2010 with no evidence for disease recurrence-released from onc f/u 12/2014 (needs annual cxr by PCP).  CXR 08/2015 stable.  . Mild cognitive impairment with memory loss    Likely from brain radiation therapy  . Open-angle glaucoma 2016   Dr. Shirley Muscat, (bilateral)---responding to topical therapy  . Osteopenia 03/2016   03/2016 DEXA T-score -2.2  . Torsades de pointes ([place]) 08/2016   avoid meds with potential for QT prolongation    Past Surgical History:  Procedure Laterality Date  . ABDOMINAL HYSTERECTOMY  1997  . APPENDECTOMY    . CARDIAC CATHETERIZATION N/A 10/02/16   Procedure: Left Heart Cath and Coronary Angiography;  Surgeon: Troy Sine,  MD;  Location: [facility];  Service: Cardiovascular;  Laterality: N/A;  . CARDIAC CATHETERIZATION  09/14/2016   EF 25%, akinesis in a pattern of Takutsubos CM.  Marland Kitchen CARPAL TUNNEL RELEASE    . CATARACT EXTRACTION    . CHOLECYSTECTOMY  2002  . COLONOSCOPY  10/24/00   normal.  [employer] hemoccult testing via [place] 06/18/15 was NEG  . IR GENERIC HISTORICAL  09/19/2016   IR SINUS/FIST TUBE CHK-NON GI 09/19/2016 Corrie Mckusick, DO [facility]-INTER[facility] RAD  . IR GENERIC HISTORICAL  10/06/2016   IR RADIOLOGIST EVAL & MGMT 10/06/2016 [facility]  INTERV RAD  . IR GENERIC HISTORICAL  10/26/2016   IR RADIOLOGIST EVAL & MGMT 10/26/2016 [facility] INTERV RAD  . IR GENERIC HISTORICAL  11/03/2016   IR RADIOLOGIST EVAL & MGMT 11/03/2016 Ardis Rowan, PA-C [facility] INTERV RAD  . LEFT HEART CATHETERIZATION WITH CORONARY ANGIOGRAM N/A 05/30/2012   Procedure: LEFT HEART CATHETERIZATION WITH CORONARY ANGIOGRAM;  Surgeon: Burnell Blanks, MD;  Location: [facility] C[facility];  Service: Cardiovascular;  Laterality: N/A;  . TRANSTHORACIC ECHOCARDIOGRAM  09/14/2016   EF 30-35 %, Akinesis of the mid-apicalanteroseptal myocardium.  Grade I DD.  Mild pulm HTN.    Outpatient Medications Prior to Visit  Medication Sig Dispense Refill  . acetaminophen (TYLENOL) 500 MG tablet Take 1,000 mg by mouth every 6 (six) hours as needed for headache (pain).    Marland Kitchen albuterol (PROAIR HFA) 108 (90 Base) MCG/ACT inhaler Inhale 2 puffs into the lungs every 6 (six) hours as needed for wheezing or shortness of breath.    Marland Kitchen albuterol (PROVENTIL) (2.5 MG/3ML) 0.083% nebulizer solution USE ONE VIAL IN NEBULIZER EVERY 4 HOURS AS NEEDED FOR WHEEZING OR SHORTNESS OF BREATH 75 mL 3  . atorvastatin (LIPITOR) 80 MG tablet TAKE ONE TABLET BY MOUTH ONCE DAILY (Patient taking differently: TAKE ONE TABLET BY MOUTH ONCE DAILY AT BEDTIME) 90 tablet 1  . Bromfenac Sodium (PROLENSA) 0.07 % SOLN Place 1 drop into the left eye at bedtime.    . clopidogrel (PLAVIX) 75 MG tablet Take 1 tablet (75 mg total) by mouth daily.    Marland Kitchen FLOVENT HFA 220 MCG/ACT inhaler INHALE TWO PUFFS BY MOUTH TWICE DAILY 12 g 6  . fluticasone (FLONASE) 50 MCG/ACT nasal spray Place 2 sprays into both nostrils at bedtime. 16 g 3  . furosemide (LASIX) 40 MG tablet Take 1 tablet (40 mg total) by mouth daily. 30 tablet   . lisinopril (PRINIVIL,ZESTRIL) 2.5 MG tablet Take 1 tablet (2.5 mg total) by mouth 2 (two) times daily.    Marland Kitchen loratadine (CLARITIN) 10 MG tablet Take 10 mg by mouth at bedtime.     . Metoprolol Tartrate 37.5 MG TABS  Take 37.5 mg by mouth 2 (two) times daily.    . nitroGLYCERIN (NITROSTAT) 0.4 MG SL tablet Place 1 tablet (0.4 mg total) under the tongue every 5 (five) minutes as needed. For chest pain 50 tablet 3  . pantoprazole (PROTONIX) 40 MG tablet TAKE ONE TABLET BY MOUTH ONCE DAILY (Patient taking differently: TAKE ONE TABLET BY MOUTH ONCE DAILY AT BEDTIME) 30 tablet 12  . potassium chloride (K-DUR) 10 MEQ tablet Take 1 tablet (10 mEq total) by mouth daily.    . prednisoLONE acetate (PRED FORTE) 1 % ophthalmic suspension Place 1 drop into the left eye at bedtime.     Marland Kitchen amoxicillin-clavulanate (AUGMENTIN) 875-125 MG tablet Take 1 tablet by mouth every 12 (twelve) hours. 5 more days. Must follow with the drain  clinic and Gen. surgery before stopping this medication (Patient not taking: Reported on 11/04/2016)    . feeding supplement (BOOST / RESOURCE BREEZE) LIQD Take 1 Container by mouth 2 (two) times daily between meals. (Patient not taking: Reported on 11/04/2016)  0  . insulin aspart (NOVOLOG) 100 UNIT/ML injection Before each meal 3 times a day, 140-199 - 2 units, 200-250 - 4 units, 251-299 - 6 units,  300-349 - 8 units,  350 or above 10 units. Dispense syringes and needles as needed, Ok to switch to PEN if approved. Substitute to any brand approved. DX DM2, Code E11.65 (Patient not taking: Reported on 11/04/2016) 1 vial 12   No facility-administered medications prior to visit.     Allergies  Allergen Reactions  . Lactose Intolerance (Gi) Diarrhea  . Ibuprofen     REACTION: Anxiousness, hyperventilates    ROS As per HPI  PE: Blood pressure (!) 100/56, pulse 98, temperature 97.9 F (36.6 C), temperature source Oral, resp. rate 16, height 5' 1.5" (1.562 m), weight 148 lb 4 oz (67.2 kg), SpO2 94 %. Gen: Alert, well appearing.  Patient is oriented to person, place, time, and situation. AFFECT: pleasant, lucid thought and speech. WER:XVQM: no injection, icteris, swelling, or exudate.  EOMI,  PERRLA. Mouth: lips without lesion/swelling.  Oral mucosa pink and moist. Oropharynx without erythema, exudate, or swelling.  CV: RRR, no m/r/g.   LUNGS: CTA bilat, nonlabored resps, good aeration in all lung fields. ABD: soft, mild TTP around drain site.  EXT: no clubbing, cyanosis, or edema.  Neuro: CN 2-12 intact bilaterally, strength 5-/5 in proximal and distal upper extremities and lower extremities bilaterally. No tremor.       LABS:  Lab Results  Component Value Date   HGBA1C 7.1 (H) 09/11/2016      Chemistry      Component Value Date/Time   NA 138 11/04/2016 0921   NA 141 01/15/2015 0848   K 3.7 11/04/2016 0921   K 4.0 01/15/2015 0848   CL 100 11/04/2016 0921   CL 105 02/18/2013 0812   CO2 28 11/04/2016 0921   CO2 25 01/15/2015 0848   BUN 10 11/04/2016 0921   BUN 9.0 01/15/2015 0848   CREATININE 0.93 11/04/2016 0921   CREATININE 0.7 01/15/2015 0848      Component Value Date/Time   CALCIUM 9.1 11/04/2016 0921   CALCIUM 9.2 01/15/2015 0848   ALKPHOS 137 (H) 11/04/2016 0921   ALKPHOS 92 01/15/2015 0848   AST 13 11/04/2016 0921   AST 19 01/15/2015 0848   ALT 14 11/04/2016 0921   ALT 20 01/15/2015 0848   BILITOT 0.5 11/04/2016 0921   BILITOT 0.34 01/15/2015 0848     Lab Results  Component Value Date   WBC 9.4 11/04/2016   HGB 12.4 11/04/2016   HCT 36.7 11/04/2016   MCV 87.0 11/04/2016   PLT 301.0 11/04/2016   Lab Results  Component Value Date   CHOL 135 09/26/2016   HDL 24 (L) 09/26/2016   LDLCALC 69 09/26/2016   LDLDIRECT 162.0 07/17/2015   TRIG 208 (H) 09/26/2016   CHOLHDL 5.6 09/26/2016   Lab Results  Component Value Date   TSH 0.49 07/01/2011   IMPRESSION AND PLAN:  1) Diverticular abscess: draining well with percutaneous drain placed by interventional radiology. Appropriate plans in place to recheck drain with IR 11/24/16 and hopefully will be able to get drain removed after that.  Check CBC today.  2) cardiac arrest: successfully  resuscitated.  Takotsubos  cardiomyopathy dx'd by echo.  Cath w/out any obstructive dz.  Continue furosemide, lopressor, and lisinopril.   Check CMET today. Pt aware of cardiology f/u appt time 11/23/16.  3) Physical debilitation: s/p major illness, requires further [facility] nursing and PT--ordered today.  4) DM 2: glucoses good on current regimen of SS 70/30 insulin bid.  An After Visit Summary was printed and given to the patient.  FOLLOW UP: Return in about 4 weeks (around 12/02/2016) for routine chronic illness f/u.  Signed:  Crissie Sickles, MD           11/04/2016

## 2016-11-05 ENCOUNTER — Other Ambulatory Visit: Payer: Self-pay | Admitting: *Deleted

## 2016-11-07 ENCOUNTER — Other Ambulatory Visit: Payer: Self-pay | Admitting: *Deleted

## 2016-11-07 DIAGNOSIS — I2510 Atherosclerotic heart disease of native coronary artery without angina pectoris: Secondary | ICD-10-CM | POA: Diagnosis not present

## 2016-11-07 DIAGNOSIS — Z4803 Encounter for change or removal of drains: Secondary | ICD-10-CM | POA: Diagnosis not present

## 2016-11-07 DIAGNOSIS — G3184 Mild cognitive impairment, so stated: Secondary | ICD-10-CM | POA: Diagnosis not present

## 2016-11-07 DIAGNOSIS — Z7984 Long term (current) use of oral hypoglycemic drugs: Secondary | ICD-10-CM | POA: Diagnosis not present

## 2016-11-07 DIAGNOSIS — Z7901 Long term (current) use of anticoagulants: Secondary | ICD-10-CM | POA: Diagnosis not present

## 2016-11-07 DIAGNOSIS — J449 Chronic obstructive pulmonary disease, unspecified: Secondary | ICD-10-CM | POA: Diagnosis not present

## 2016-11-07 DIAGNOSIS — H4010X0 Unspecified open-angle glaucoma, stage unspecified: Secondary | ICD-10-CM | POA: Diagnosis not present

## 2016-11-07 DIAGNOSIS — E11319 Type 2 diabetes mellitus with unspecified diabetic retinopathy without macular edema: Secondary | ICD-10-CM | POA: Diagnosis not present

## 2016-11-07 DIAGNOSIS — I119 Hypertensive heart disease without heart failure: Secondary | ICD-10-CM | POA: Diagnosis not present

## 2016-11-07 DIAGNOSIS — I4581 Long QT syndrome: Secondary | ICD-10-CM | POA: Diagnosis not present

## 2016-11-07 DIAGNOSIS — Z85118 Personal history of other malignant neoplasm of bronchus and lung: Secondary | ICD-10-CM | POA: Diagnosis not present

## 2016-11-07 DIAGNOSIS — K5720 Diverticulitis of large intestine with perforation and abscess without bleeding: Secondary | ICD-10-CM | POA: Diagnosis not present

## 2016-11-07 DIAGNOSIS — Z7982 Long term (current) use of aspirin: Secondary | ICD-10-CM | POA: Diagnosis not present

## 2016-11-07 NOTE — Patient Outreach (Signed)
Acute home visit to assist daughter begin daily drainage bag irrigations.  I met with pt, and her daughter, [name]. I provided equipment and supplies: gloves, 250 cc nornal saline, syringes and alcohol swabs. The following instructions were given:  1. Wash hands with dial soap very well and dry. 2: Open one 3 cc syringe, keep it in the paper covering until ready to use. 3: Pour small amount of saline into saline bottle cap, being careful not to touch the inside of the cap. 4. Put on the gloves and draw up 3 cc of saline. 5. Disconnect the drainage tubing from the drainage bag and instill the 3 cc of saline. 6. Cleanse the opening of the tube and bag with an alcohol swab and reconnect. 7. Do this daily until told otherwise.  Daughter verbalized understanding of the procedure and willingness to to this.  I will call pt on Monday to see how things are going.  [name] [facility] [place] [phone]

## 2016-11-07 NOTE — Patient Outreach (Signed)
Transitition of care call. I spoke to pt's husband, [name], and he reports all went well with his daughter irrigated Mrs. [place] drainage tube. No new problems to report. I will follow up weekly. Home health PT is to start this week.  [name] [facility] [phone]

## 2016-11-08 DIAGNOSIS — Z85118 Personal history of other malignant neoplasm of bronchus and lung: Secondary | ICD-10-CM | POA: Diagnosis not present

## 2016-11-08 DIAGNOSIS — Z7984 Long term (current) use of oral hypoglycemic drugs: Secondary | ICD-10-CM | POA: Diagnosis not present

## 2016-11-08 DIAGNOSIS — H4010X0 Unspecified open-angle glaucoma, stage unspecified: Secondary | ICD-10-CM | POA: Diagnosis not present

## 2016-11-08 DIAGNOSIS — Z7901 Long term (current) use of anticoagulants: Secondary | ICD-10-CM | POA: Diagnosis not present

## 2016-11-08 DIAGNOSIS — I2510 Atherosclerotic heart disease of native coronary artery without angina pectoris: Secondary | ICD-10-CM | POA: Diagnosis not present

## 2016-11-08 DIAGNOSIS — Z7982 Long term (current) use of aspirin: Secondary | ICD-10-CM | POA: Diagnosis not present

## 2016-11-08 DIAGNOSIS — E11319 Type 2 diabetes mellitus with unspecified diabetic retinopathy without macular edema: Secondary | ICD-10-CM | POA: Diagnosis not present

## 2016-11-08 DIAGNOSIS — Z4803 Encounter for change or removal of drains: Secondary | ICD-10-CM | POA: Diagnosis not present

## 2016-11-08 DIAGNOSIS — K5720 Diverticulitis of large intestine with perforation and abscess without bleeding: Secondary | ICD-10-CM | POA: Diagnosis not present

## 2016-11-08 DIAGNOSIS — J449 Chronic obstructive pulmonary disease, unspecified: Secondary | ICD-10-CM | POA: Diagnosis not present

## 2016-11-08 DIAGNOSIS — I119 Hypertensive heart disease without heart failure: Secondary | ICD-10-CM | POA: Diagnosis not present

## 2016-11-08 DIAGNOSIS — I4581 Long QT syndrome: Secondary | ICD-10-CM | POA: Diagnosis not present

## 2016-11-08 DIAGNOSIS — G3184 Mild cognitive impairment, so stated: Secondary | ICD-10-CM | POA: Diagnosis not present

## 2016-11-09 ENCOUNTER — Other Ambulatory Visit: Payer: Self-pay

## 2016-11-12 DIAGNOSIS — I2510 Atherosclerotic heart disease of native coronary artery without angina pectoris: Secondary | ICD-10-CM | POA: Diagnosis not present

## 2016-11-12 DIAGNOSIS — Z7901 Long term (current) use of anticoagulants: Secondary | ICD-10-CM | POA: Diagnosis not present

## 2016-11-12 DIAGNOSIS — I119 Hypertensive heart disease without heart failure: Secondary | ICD-10-CM | POA: Diagnosis not present

## 2016-11-12 DIAGNOSIS — J449 Chronic obstructive pulmonary disease, unspecified: Secondary | ICD-10-CM | POA: Diagnosis not present

## 2016-11-12 DIAGNOSIS — Z4803 Encounter for change or removal of drains: Secondary | ICD-10-CM | POA: Diagnosis not present

## 2016-11-12 DIAGNOSIS — I4581 Long QT syndrome: Secondary | ICD-10-CM | POA: Diagnosis not present

## 2016-11-12 DIAGNOSIS — E11319 Type 2 diabetes mellitus with unspecified diabetic retinopathy without macular edema: Secondary | ICD-10-CM | POA: Diagnosis not present

## 2016-11-12 DIAGNOSIS — Z7982 Long term (current) use of aspirin: Secondary | ICD-10-CM | POA: Diagnosis not present

## 2016-11-12 DIAGNOSIS — H4010X0 Unspecified open-angle glaucoma, stage unspecified: Secondary | ICD-10-CM | POA: Diagnosis not present

## 2016-11-12 DIAGNOSIS — G3184 Mild cognitive impairment, so stated: Secondary | ICD-10-CM | POA: Diagnosis not present

## 2016-11-12 DIAGNOSIS — Z7984 Long term (current) use of oral hypoglycemic drugs: Secondary | ICD-10-CM | POA: Diagnosis not present

## 2016-11-12 DIAGNOSIS — K5720 Diverticulitis of large intestine with perforation and abscess without bleeding: Secondary | ICD-10-CM | POA: Diagnosis not present

## 2016-11-12 DIAGNOSIS — Z85118 Personal history of other malignant neoplasm of bronchus and lung: Secondary | ICD-10-CM | POA: Diagnosis not present

## 2016-11-14 ENCOUNTER — Other Ambulatory Visit: Payer: Self-pay | Admitting: *Deleted

## 2016-11-14 DIAGNOSIS — Z85118 Personal history of other malignant neoplasm of bronchus and lung: Secondary | ICD-10-CM | POA: Diagnosis not present

## 2016-11-14 DIAGNOSIS — Z7901 Long term (current) use of anticoagulants: Secondary | ICD-10-CM | POA: Diagnosis not present

## 2016-11-14 DIAGNOSIS — J449 Chronic obstructive pulmonary disease, unspecified: Secondary | ICD-10-CM | POA: Diagnosis not present

## 2016-11-14 DIAGNOSIS — Z7984 Long term (current) use of oral hypoglycemic drugs: Secondary | ICD-10-CM | POA: Diagnosis not present

## 2016-11-14 DIAGNOSIS — H4010X0 Unspecified open-angle glaucoma, stage unspecified: Secondary | ICD-10-CM | POA: Diagnosis not present

## 2016-11-14 DIAGNOSIS — I119 Hypertensive heart disease without heart failure: Secondary | ICD-10-CM | POA: Diagnosis not present

## 2016-11-14 DIAGNOSIS — Z7982 Long term (current) use of aspirin: Secondary | ICD-10-CM | POA: Diagnosis not present

## 2016-11-14 DIAGNOSIS — E11319 Type 2 diabetes mellitus with unspecified diabetic retinopathy without macular edema: Secondary | ICD-10-CM | POA: Diagnosis not present

## 2016-11-14 DIAGNOSIS — I2510 Atherosclerotic heart disease of native coronary artery without angina pectoris: Secondary | ICD-10-CM | POA: Diagnosis not present

## 2016-11-14 DIAGNOSIS — G3184 Mild cognitive impairment, so stated: Secondary | ICD-10-CM | POA: Diagnosis not present

## 2016-11-14 DIAGNOSIS — K5720 Diverticulitis of large intestine with perforation and abscess without bleeding: Secondary | ICD-10-CM | POA: Diagnosis not present

## 2016-11-14 DIAGNOSIS — I4581 Long QT syndrome: Secondary | ICD-10-CM | POA: Diagnosis not present

## 2016-11-14 DIAGNOSIS — Z4803 Encounter for change or removal of drains: Secondary | ICD-10-CM | POA: Diagnosis not present

## 2016-11-14 NOTE — Patient Outreach (Signed)
Transition of care call. Pt reports she is getting along fair. Her daughter is irritating her drainage tube daily. Pt reports that the drainage is very odorous. I have told her that since we are irrigating the tube, they can empty the drainage bag and rinse it with saline and empty that. She will tell her daughter, that I can instructed them to do this.   She is working with PT. She is able to participate fully with their instructions.  I will call pt in a week to follow up.  [name] [facility] [phone]

## 2016-11-15 DIAGNOSIS — I4581 Long QT syndrome: Secondary | ICD-10-CM | POA: Diagnosis not present

## 2016-11-15 DIAGNOSIS — Z7982 Long term (current) use of aspirin: Secondary | ICD-10-CM | POA: Diagnosis not present

## 2016-11-15 DIAGNOSIS — I119 Hypertensive heart disease without heart failure: Secondary | ICD-10-CM | POA: Diagnosis not present

## 2016-11-15 DIAGNOSIS — E11319 Type 2 diabetes mellitus with unspecified diabetic retinopathy without macular edema: Secondary | ICD-10-CM | POA: Diagnosis not present

## 2016-11-15 DIAGNOSIS — Z4803 Encounter for change or removal of drains: Secondary | ICD-10-CM | POA: Diagnosis not present

## 2016-11-15 DIAGNOSIS — H4010X0 Unspecified open-angle glaucoma, stage unspecified: Secondary | ICD-10-CM | POA: Diagnosis not present

## 2016-11-15 DIAGNOSIS — J449 Chronic obstructive pulmonary disease, unspecified: Secondary | ICD-10-CM | POA: Diagnosis not present

## 2016-11-15 DIAGNOSIS — K5720 Diverticulitis of large intestine with perforation and abscess without bleeding: Secondary | ICD-10-CM | POA: Diagnosis not present

## 2016-11-15 DIAGNOSIS — G3184 Mild cognitive impairment, so stated: Secondary | ICD-10-CM | POA: Diagnosis not present

## 2016-11-15 DIAGNOSIS — Z7984 Long term (current) use of oral hypoglycemic drugs: Secondary | ICD-10-CM | POA: Diagnosis not present

## 2016-11-15 DIAGNOSIS — I2510 Atherosclerotic heart disease of native coronary artery without angina pectoris: Secondary | ICD-10-CM | POA: Diagnosis not present

## 2016-11-15 DIAGNOSIS — Z7901 Long term (current) use of anticoagulants: Secondary | ICD-10-CM | POA: Diagnosis not present

## 2016-11-15 DIAGNOSIS — Z85118 Personal history of other malignant neoplasm of bronchus and lung: Secondary | ICD-10-CM | POA: Diagnosis not present

## 2016-11-17 DIAGNOSIS — J449 Chronic obstructive pulmonary disease, unspecified: Secondary | ICD-10-CM | POA: Diagnosis not present

## 2016-11-17 DIAGNOSIS — Z7984 Long term (current) use of oral hypoglycemic drugs: Secondary | ICD-10-CM | POA: Diagnosis not present

## 2016-11-17 DIAGNOSIS — K5720 Diverticulitis of large intestine with perforation and abscess without bleeding: Secondary | ICD-10-CM | POA: Diagnosis not present

## 2016-11-17 DIAGNOSIS — H4010X0 Unspecified open-angle glaucoma, stage unspecified: Secondary | ICD-10-CM | POA: Diagnosis not present

## 2016-11-17 DIAGNOSIS — Z85118 Personal history of other malignant neoplasm of bronchus and lung: Secondary | ICD-10-CM | POA: Diagnosis not present

## 2016-11-17 DIAGNOSIS — I4581 Long QT syndrome: Secondary | ICD-10-CM | POA: Diagnosis not present

## 2016-11-17 DIAGNOSIS — I119 Hypertensive heart disease without heart failure: Secondary | ICD-10-CM | POA: Diagnosis not present

## 2016-11-17 DIAGNOSIS — Z7901 Long term (current) use of anticoagulants: Secondary | ICD-10-CM | POA: Diagnosis not present

## 2016-11-17 DIAGNOSIS — E11319 Type 2 diabetes mellitus with unspecified diabetic retinopathy without macular edema: Secondary | ICD-10-CM | POA: Diagnosis not present

## 2016-11-17 DIAGNOSIS — Z4803 Encounter for change or removal of drains: Secondary | ICD-10-CM | POA: Diagnosis not present

## 2016-11-17 DIAGNOSIS — Z7982 Long term (current) use of aspirin: Secondary | ICD-10-CM | POA: Diagnosis not present

## 2016-11-17 DIAGNOSIS — I2510 Atherosclerotic heart disease of native coronary artery without angina pectoris: Secondary | ICD-10-CM | POA: Diagnosis not present

## 2016-11-17 DIAGNOSIS — G3184 Mild cognitive impairment, so stated: Secondary | ICD-10-CM | POA: Diagnosis not present

## 2016-11-18 DIAGNOSIS — Z85118 Personal history of other malignant neoplasm of bronchus and lung: Secondary | ICD-10-CM | POA: Diagnosis not present

## 2016-11-18 DIAGNOSIS — Z4803 Encounter for change or removal of drains: Secondary | ICD-10-CM | POA: Diagnosis not present

## 2016-11-18 DIAGNOSIS — G3184 Mild cognitive impairment, so stated: Secondary | ICD-10-CM | POA: Diagnosis not present

## 2016-11-18 DIAGNOSIS — I119 Hypertensive heart disease without heart failure: Secondary | ICD-10-CM | POA: Diagnosis not present

## 2016-11-18 DIAGNOSIS — I4581 Long QT syndrome: Secondary | ICD-10-CM | POA: Diagnosis not present

## 2016-11-18 DIAGNOSIS — Z7901 Long term (current) use of anticoagulants: Secondary | ICD-10-CM | POA: Diagnosis not present

## 2016-11-18 DIAGNOSIS — E11319 Type 2 diabetes mellitus with unspecified diabetic retinopathy without macular edema: Secondary | ICD-10-CM | POA: Diagnosis not present

## 2016-11-18 DIAGNOSIS — H4010X0 Unspecified open-angle glaucoma, stage unspecified: Secondary | ICD-10-CM | POA: Diagnosis not present

## 2016-11-18 DIAGNOSIS — Z7984 Long term (current) use of oral hypoglycemic drugs: Secondary | ICD-10-CM | POA: Diagnosis not present

## 2016-11-18 DIAGNOSIS — Z7982 Long term (current) use of aspirin: Secondary | ICD-10-CM | POA: Diagnosis not present

## 2016-11-18 DIAGNOSIS — K5720 Diverticulitis of large intestine with perforation and abscess without bleeding: Secondary | ICD-10-CM | POA: Diagnosis not present

## 2016-11-18 DIAGNOSIS — I2510 Atherosclerotic heart disease of native coronary artery without angina pectoris: Secondary | ICD-10-CM | POA: Diagnosis not present

## 2016-11-18 DIAGNOSIS — J449 Chronic obstructive pulmonary disease, unspecified: Secondary | ICD-10-CM | POA: Diagnosis not present

## 2016-11-21 ENCOUNTER — Encounter: Payer: Self-pay | Admitting: Family Medicine

## 2016-11-21 ENCOUNTER — Other Ambulatory Visit: Payer: Self-pay | Admitting: *Deleted

## 2016-11-21 DIAGNOSIS — Z7982 Long term (current) use of aspirin: Secondary | ICD-10-CM | POA: Diagnosis not present

## 2016-11-21 DIAGNOSIS — G3184 Mild cognitive impairment, so stated: Secondary | ICD-10-CM | POA: Diagnosis not present

## 2016-11-21 DIAGNOSIS — Z85118 Personal history of other malignant neoplasm of bronchus and lung: Secondary | ICD-10-CM | POA: Diagnosis not present

## 2016-11-21 DIAGNOSIS — Z7901 Long term (current) use of anticoagulants: Secondary | ICD-10-CM | POA: Diagnosis not present

## 2016-11-21 DIAGNOSIS — I4581 Long QT syndrome: Secondary | ICD-10-CM | POA: Diagnosis not present

## 2016-11-21 DIAGNOSIS — E11319 Type 2 diabetes mellitus with unspecified diabetic retinopathy without macular edema: Secondary | ICD-10-CM | POA: Diagnosis not present

## 2016-11-21 DIAGNOSIS — Z7984 Long term (current) use of oral hypoglycemic drugs: Secondary | ICD-10-CM | POA: Diagnosis not present

## 2016-11-21 DIAGNOSIS — I119 Hypertensive heart disease without heart failure: Secondary | ICD-10-CM | POA: Diagnosis not present

## 2016-11-21 DIAGNOSIS — J449 Chronic obstructive pulmonary disease, unspecified: Secondary | ICD-10-CM | POA: Diagnosis not present

## 2016-11-21 DIAGNOSIS — K5720 Diverticulitis of large intestine with perforation and abscess without bleeding: Secondary | ICD-10-CM | POA: Diagnosis not present

## 2016-11-21 DIAGNOSIS — I2510 Atherosclerotic heart disease of native coronary artery without angina pectoris: Secondary | ICD-10-CM | POA: Diagnosis not present

## 2016-11-21 DIAGNOSIS — Z4803 Encounter for change or removal of drains: Secondary | ICD-10-CM | POA: Diagnosis not present

## 2016-11-21 DIAGNOSIS — H4010X0 Unspecified open-angle glaucoma, stage unspecified: Secondary | ICD-10-CM | POA: Diagnosis not present

## 2016-11-21 MED ORDER — CLOPIDOGREL BISULFATE 75 MG PO TABS: 75.0000 mg | ORAL_TABLET | Freq: Every day | ORAL | 6 refills | Status: DC

## 2016-11-21 NOTE — Patient Outreach (Signed)
Transition of care call. Pt reports she is sick and tired of the stinky drain. Despite efforts to minimize the odor by emptying the contents before irrigation the bag or drainage still is very odorous. Pt daughter continues to irrigate the drainage tube with 3 cc saline daily, using the technique demonstrated. Pt is progressing with PT and is feeling stronger. She went to church Sunday, but she says she was embarrased because her husband told her he could "smell" her. This is very disturbing to her.  She will see her cardiologist on Thursday this week and will go to interventional radiology to have the tube placement and abdomen scanned.  I have reinforced to her that I am available to answer her questions or to visit if she needs to see me face to face.  I will have my partner call her next week, in my absence, as I will be on PAL.  [name] [facility] [phone]

## 2016-11-21 NOTE — Telephone Encounter (Signed)
Fax from [facility].  RF request for plavix LOV: 11/04/16 Next ov: 11/29/16 Last written: unknown- new med?  Unsure if this should go to cardiology. Please advise. Thanks.   Has cardiology apt on 11/23/16.

## 2016-11-22 DIAGNOSIS — Z7901 Long term (current) use of anticoagulants: Secondary | ICD-10-CM

## 2016-11-22 DIAGNOSIS — K5720 Diverticulitis of large intestine with perforation and abscess without bleeding: Secondary | ICD-10-CM

## 2016-11-22 DIAGNOSIS — I119 Hypertensive heart disease without heart failure: Secondary | ICD-10-CM

## 2016-11-22 DIAGNOSIS — Z7982 Long term (current) use of aspirin: Secondary | ICD-10-CM

## 2016-11-22 DIAGNOSIS — I2510 Atherosclerotic heart disease of native coronary artery without angina pectoris: Secondary | ICD-10-CM

## 2016-11-22 DIAGNOSIS — Z4803 Encounter for change or removal of drains: Secondary | ICD-10-CM

## 2016-11-22 DIAGNOSIS — Z7984 Long term (current) use of oral hypoglycemic drugs: Secondary | ICD-10-CM

## 2016-11-22 DIAGNOSIS — H4010X0 Unspecified open-angle glaucoma, stage unspecified: Secondary | ICD-10-CM

## 2016-11-22 DIAGNOSIS — G3184 Mild cognitive impairment, so stated: Secondary | ICD-10-CM

## 2016-11-22 DIAGNOSIS — Z85118 Personal history of other malignant neoplasm of bronchus and lung: Secondary | ICD-10-CM

## 2016-11-22 DIAGNOSIS — I4581 Long QT syndrome: Secondary | ICD-10-CM

## 2016-11-22 DIAGNOSIS — E11319 Type 2 diabetes mellitus with unspecified diabetic retinopathy without macular edema: Secondary | ICD-10-CM

## 2016-11-22 DIAGNOSIS — J449 Chronic obstructive pulmonary disease, unspecified: Secondary | ICD-10-CM

## 2016-11-23 ENCOUNTER — Encounter: Payer: Self-pay | Admitting: Physician Assistant

## 2016-11-23 ENCOUNTER — Ambulatory Visit (INDEPENDENT_AMBULATORY_CARE_PROVIDER_SITE_OTHER): Payer: Medicare Other | Admitting: Physician Assistant

## 2016-11-23 VITALS — BP 109/65 | HR 89 | Ht 62.0 in | Wt 148.6 lb

## 2016-11-23 DIAGNOSIS — E119 Type 2 diabetes mellitus without complications: Secondary | ICD-10-CM

## 2016-11-23 DIAGNOSIS — E785 Hyperlipidemia, unspecified: Secondary | ICD-10-CM

## 2016-11-23 DIAGNOSIS — I2510 Atherosclerotic heart disease of native coronary artery without angina pectoris: Secondary | ICD-10-CM | POA: Diagnosis not present

## 2016-11-23 DIAGNOSIS — I5181 Takotsubo syndrome: Secondary | ICD-10-CM | POA: Diagnosis not present

## 2016-11-23 DIAGNOSIS — IMO0002 Reserved for concepts with insufficient information to code with codable children: Secondary | ICD-10-CM

## 2016-11-23 DIAGNOSIS — I10 Essential (primary) hypertension: Secondary | ICD-10-CM | POA: Diagnosis not present

## 2016-11-23 DIAGNOSIS — K651 Peritoneal abscess: Secondary | ICD-10-CM

## 2016-11-23 DIAGNOSIS — Z794 Long term (current) use of insulin: Secondary | ICD-10-CM

## 2016-11-23 MED ORDER — LISINOPRIL 5 MG PO TABS: 2.5000 mg | ORAL_TABLET | Freq: Every day | ORAL | 3 refills | Status: DC

## 2016-11-23 NOTE — Patient Instructions (Signed)
Medication Instructions:  START LISINOPRIL '5MG'$  DAILY  If you need a refill on your cardiac medications before your next appointment, please call your pharmacy.  Labwork: NONE  Testing/Procedures: Your physician has requested that you have an echocardiogram. Echocardiography is a painless test that uses sound waves to create images of your heart. It provides your doctor with information about the size and shape of your heart and how well your heart's chambers and valves are working. This procedure takes approximately one hour. There are no restrictions for this procedure.   Follow-Up: Your physician recommends that you schedule a follow-up appointment in: 2 [place]    Thank you for choosing [facility] at [facility]!!    Sharyn Lull, LPN HAO MENG, PA-C

## 2016-11-23 NOTE — Progress Notes (Signed)
Cardiology Office Note    Date:  11/23/2016   ID:  [name], DOB 1946-06-30, MRN [id number]  PCP:  [name], MD  Cardiologist:  Dr. Ellyn Hack - remotely by Dr. Lia Foyer   Chief Complaint  Patient presents with  . Follow-up    pt c/o pain in chest when coughing; SOB on exertion; no other Sx.    History of Present Illness:  [name] is a 71 y.o. female with PMH of nonobstructive CAD, HTN, HLD, DM II, COPD, former smoker, h/o diverticulitis and h/o non-small cell lung cancer s/p chemo and radiation. She most recently presented to the hospital on 11/12/2015 with LLQ pain and found to have diverticulitis in the mid sigmoid colon with abscess formation along with small fossae of pneumoperitoneum in the area. She was started on Zosyn. She underwent IR guided drainage with drain placed on 09/12/2016, she has a indwelling catheter for further drainage. Despite this, she had worsening abdominal pain. In the morning of 09/14/2016, she went into nonsustained VT with heart rate in the 190s and associated hypotension. She subsequently went into Torsades followed by cardiac arrest with 6 minutes of CPR before ROSC. She had defibrillation 2 for possible V. fib. She was intubated during the code.   Echocardiogram obtained on the same day showed EF 38-25%, grade 1 diastolic dysfunction, PA peak pressure 38 mmHg. She underwent cardiac catheterization on 09/14/2016 which showed EF 20-25%, severe LV dysfunction with more pronounced mid, distal anterolateral, apical and distal inferior hypocontractility suggestive of Takotsubo cardiomyopathy, 1+ MR, 10% ostial left circumflex lesion, 25% D1 lesion, otherwise no significant coronary artery disease. Patient was also seen by electrophysiology service, there was borderline QT prolongation but no long short coupling, Dr. Caryl Comes felt that ventricular tachycardia may occur in Takotsubo cardiomyopathy however does not have significant long-term consequences,  he mentioned that we could consider a LifeVest upon discharge. The lifevest does not appears to to have been placed. There was also plan to obtain repeat echocardiogram in 6-8 weeks to reassess EF.   Since her discharge, she continued to have drainage from the catheter. She is going to see interventional radiology service tomorrow for reassessment of the drain. Otherwise, she has been doing well since the hospital. She was initially discharged to skilled nursing facility for PT and OT, since then, she has been discharged home. She denies any further event. He denies any dizziness or feeling of passing out. I have significant chest pain as result of CPR received during the previous hospitalization. She says her chest would hurt every time she taken deep breath. This she did not have a LifeVest part to discharge from the hospital, I am not confident how it will help at this point since she left the hospital close to 2 month ago and has not had any further event. I am more interested to see her ejection fraction at this time. If her EF has recovered, there is further evidence that she does not need a LifeVest. I will order a repeat echocardiogram.    Past Medical History:  Diagnosis Date  . Age-related nuclear cataract of both eyes 2016   +cortical age related cataracts OU   . Allergy    seasonal  . Arthritis    hands  . Bilateral diabetic retinopathy (HCC) 2015   Dr. Zigmund Daniel  . Blood transfusion without reported diagnosis    when taking chemo  . CAD (coronary artery disease)    NON obstructive. Cath 2006 preserved LV  fxn, scattered irregularities without critical stenosis. 2008 stress echo negative for ischemia, but with hypertensive response  . Chronic renal insufficiency, stage 2 (mild)    GFR 60s  . COPD (chronic obstructive pulmonary disease) ([place])   . CVA (cerebral vascular accident) ([place]) 09/2016   MRI did show a left-sided small ischemic stroke which was acute but likely incidental (MRI  was done b/c pt had TIA sx's in hospital).  Neuro put her on plavix at that time.  . Diabetes mellitus with complication (HCC)    diab retinopathy OU (laser)  . Diverticulitis 05/2016  . GERD (gastroesophageal reflux disease)    protonix  . History of diverticulitis of colon    with abscess; required IR percutaneous drain placed 09/2016.  Marland Kitchen Hyperlipidemia   . Hypertension   . Lung cancer ([place])    non-small cell lung ca, stage III in 05/2011; systemic chemotherapy concurrent with radiation followed by prophylactic cranial irradiation and has been observation since July of 2010 with no evidence for disease recurrence-released from onc f/u 12/2014 (needs annual cxr by PCP).  CXR 08/2015 stable.  . Mild cognitive impairment with memory loss    Likely from brain radiation therapy  . Open-angle glaucoma 2016   Dr. Shirley Muscat, (bilateral)---responding to topical therapy  . Osteopenia 03/2016   03/2016 DEXA T-score -2.2  . 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 ABDOMINAL HYSTERECTOMY  1997  . APPENDECTOMY    . CARDIAC CATHETERIZATION N/A 09/14/2016   Procedure: Left Heart Cath and Coronary Angiography;  Surgeon: Troy Sine, MD;  Location: [facility];  Service: Cardiovascular;  Laterality: N/A;  . CARDIAC CATHETERIZATION  09/14/2016   EF 25%, akinesis in a pattern of Takutsubos CM.  Marland Kitchen CARPAL TUNNEL RELEASE    . CATARACT EXTRACTION    . CHOLECYSTECTOMY  2002  . COLONOSCOPY  10/24/00   normal.  [employer] hemoccult testing via [place] 06/18/15 was NEG  . IR GENERIC HISTORICAL  09/19/2016   IR SINUS/FIST TUBE CHK-NON GI 09/19/2016 Corrie Mckusick, DO [facility]  . IR GENERIC HISTORICAL  10/06/2016   IR RADIOLOGIST EVAL & MGMT 10/06/2016 [facility]  . IR GENERIC HISTORICAL  10/26/2016   IR RADIOLOGIST EVAL & MGMT 10/26/2016 [facility]  . IR GENERIC HISTORICAL  11/03/2016   IR  RADIOLOGIST EVAL & MGMT 11/03/2016 Ardis Rowan, PA-C [facility]  . LEFT HEART CATHETERIZATION WITH CORONARY ANGIOGRAM N/A 05/30/2012   Procedure: LEFT HEART CATHETERIZATION WITH CORONARY ANGIOGRAM;  Surgeon: Burnell Blanks, MD;  Location: [facility];  Service: Cardiovascular;  Laterality: N/A;  . TRANSTHORACIC ECHOCARDIOGRAM  09/14/2016   EF 30-35 %, Akinesis of the mid-apicalanteroseptal myocardium.  Grade I DD.  Mild pulm HTN.    Current Medications: Outpatient Medications Prior to Visit  Medication Sig Dispense Refill  . acetaminophen (TYLENOL) 500 MG tablet Take 1,000 mg by mouth every 6 (six) hours as needed for headache (pain).    Marland Kitchen albuterol (PROAIR HFA) 108 (90 Base) MCG/ACT inhaler Inhale 2 puffs into the lungs every 6 (six) hours as needed for wheezing or shortness of breath.    Marland Kitchen albuterol (PROVENTIL) (2.5 MG/3ML) 0.083% nebulizer solution USE ONE VIAL IN NEBULIZER EVERY 4 HOURS AS NEEDED FOR WHEEZING OR SHORTNESS OF BREATH 75 mL 3  . atorvastatin (LIPITOR) 80 MG tablet TAKE ONE TABLET BY MOUTH ONCE DAILY (  Patient taking differently: TAKE ONE TABLET BY MOUTH ONCE DAILY AT BEDTIME) 90 tablet 1  . Bromfenac Sodium (PROLENSA) 0.07 % SOLN Place 1 drop into the left eye at bedtime.    . clopidogrel (PLAVIX) 75 MG tablet Take 1 tablet (75 mg total) by mouth daily. 30 tablet 6  . FLOVENT HFA 220 MCG/ACT inhaler INHALE TWO PUFFS BY MOUTH TWICE DAILY 12 g 6  . fluticasone (FLONASE) 50 MCG/ACT nasal spray Place 2 sprays into both nostrils at bedtime. 16 g 3  . furosemide (LASIX) 40 MG tablet Take 1 tablet (40 mg total) by mouth daily. 30 tablet   . HUMULIN 70/30 KWIKPEN (70-30) 100 UNIT/ML PEN Per sliding scale, takes this qAM and aPM:  140-199 - 2 units, 200-250 - 4 units, 251-299 - 6 units,  300-349 - 8 units,  350 or above 10 units. Dispense syringes and needles as needed, Ok to switch to PEN if approved. Substitute to any brand approved. DX DM2, Code E11.65    .  loratadine (CLARITIN) 10 MG tablet Take 10 mg by mouth at bedtime.     . Metoprolol Tartrate 37.5 MG TABS Take 37.5 mg by mouth 2 (two) times daily.    . nitroGLYCERIN (NITROSTAT) 0.4 MG SL tablet Place 1 tablet (0.4 mg total) under the tongue every 5 (five) minutes as needed. For chest pain 50 tablet 3  . pantoprazole (PROTONIX) 40 MG tablet TAKE ONE TABLET BY MOUTH ONCE DAILY (Patient taking differently: TAKE ONE TABLET BY MOUTH ONCE DAILY AT BEDTIME) 30 tablet 12  . potassium chloride (K-DUR) 10 MEQ tablet Take 1 tablet (10 mEq total) by mouth daily.    . prednisoLONE acetate (PRED FORTE) 1 % ophthalmic suspension Place 1 drop into the left eye at bedtime.     Marland Kitchen lisinopril (PRINIVIL,ZESTRIL) 2.5 MG tablet Take 1 tablet (2.5 mg total) by mouth 2 (two) times daily.     No facility-administered medications prior to visit.      Allergies:   Lactose intolerance (gi) and Ibuprofen   Social History   Social History  . Marital status: Married    Spouse name: N/A  . Number of children: N/A  . Years of education: N/A   Occupational History  . Retired Systems analyst Unemployed   Social History Main Topics  . Smoking status: Former Smoker    Packs/day: 1.00    Years: 45.00    Types: Cigarettes    Quit date: 10/24/2008  . Smokeless tobacco: Never Used  . Alcohol use No  . Drug use: No  . Sexual activity: Not Asked   Other Topics Concern  . None   Social History Narrative   Lives in [place], has husband and daughter and son. Daughter's family is living with her and husband at present. She is ambulatory daily without cane or walker.      Family History:  The patient's family history includes Diabetes in her brother, father, and mother; Heart attack in her brother, brother, and brother; Heart disease in her brother; Heart failure in her mother; Kidney disease in her mother.   ROS:   Please see the history of present illness.    ROS All other systems reviewed and are  negative.   PHYSICAL EXAM:   VS:  BP 109/65 (BP Location: Left Arm, Patient Position: Sitting, Cuff Size: Normal)   Pulse 89   Ht '5\' 2"'$  (1.575 m)   Wt 148 lb 9.6 oz (67.4 kg)   SpO2 98%  BMI 27.18 kg/m    GEN: Well nourished, well developed, in no acute distress  HEENT: normal  Neck: no JVD, carotid bruits, or masses Cardiac: RRR; no murmurs, rubs, or gallops,no edema  Respiratory:  clear to auscultation bilaterally, normal work of breathing GI: soft, nontender, nondistended, + BS MS: no deformity or atrophy  Skin: warm and dry, no rash Neuro:  Alert and Oriented x 3, Strength and sensation are intact Psych: euthymic mood, full affect  Wt Readings from Last 3 Encounters:  11/23/16 148 lb 9.6 oz (67.4 kg)  11/04/16 148 lb 4 oz (67.2 kg)  10/31/16 155 lb (70.3 kg)      Studies/Labs Reviewed:   EKG:  EKG is not ordered today.    Recent Labs: 09/22/2016: Magnesium 2.2 11/04/2016: ALT 14; BUN 10; Creatinine, Ser 0.93; Hemoglobin 12.4; Platelets 301.0; Potassium 3.7; Sodium 138   Lipid Panel    Component Value Date/Time   CHOL 135 09/26/2016 0716   TRIG 208 (H) 09/26/2016 0716   TRIG 327 (HH) 08/15/2006 1601   HDL 24 (L) 09/26/2016 0716   CHOLHDL 5.6 09/26/2016 0716   VLDL 42 (H) 09/26/2016 0716   LDLCALC 69 09/26/2016 0716   LDLDIRECT 162.0 07/17/2015 0823    Additional studies/ records that were reviewed today include:   LHC Report 05/2012 Hemodynamic Findings: Central aortic pressure: 118/56 Left ventricular pressure: 131/5/12  Angiographic Findings:  Left main: No obstructive disease.   Left Anterior Descending Artery: Moderate sized vessel with luminal irregularities in the mid segment. There are two diagonal branches. The first diagonal branch is moderate sized with mild plaque at the ostium. The second diagonal branch is very small. The LAD quickly tapers to a small caliber vessel beyond the second diagonal branch and wraps around the apex.    Circumflex Artery: Moderate sized vessel with no obstructive disease noted. The first marginal is very small. The second marginal branch is moderate sized with no disease. The distal AV groove Circumflex is small with no disease noted.   Right Coronary Artery: Moderate sized vessel with 30% proximal stenosis, 30% tubular mid to distal stenosis.   Left Ventricular Angiogram: LVEF=65%. No WMA. No MR  Impression: 1. Mild, non-obstructive CAD 2. Normal LV systolic function 3. Non-cardiac chest pain  Recommendations: Medical management including ASA and statin. Should be ok to discharge today after bedrest. She can f/u with Dr. Lia Foyer in our [facility] office in several weeks.   2D Echocardiogram 11/22:  Study Conclusions  - Left ventricle: The cavity size was normal. Wall thickness was normal. Systolic function was moderately to severely reduced. The estimated ejection fraction was in the range of 30% to 35%. Akinesis of the mid-apicalanteroseptal myocardium. Doppler parameters are consistent with abnormal left ventricular relaxation (grade 1 diastolic dysfunction). - Aortic valve: Mildly calcified annulus. There was trivial regurgitation. - Pulmonary arteries: Systolic pressure was mildly increased. PA peak pressure: 38 mm Hg (S). - Pericardium, extracardiac: A trivial pericardial effusion was identified.  Impressions:  - Technically difficult images The LV function is moderately reduced with EF of 30-35%. There is mid - distal anteroseptal akinesis   Cath 09/14/2016 Conclusion     There is severe left ventricular systolic dysfunction.  LV end diastolic pressure is moderately elevated.  There is trivial (1+) mitral regurgitation.  Ost Cx to Prox Cx lesion, 10 %stenosed.  1st Diag lesion, 25 %stenosed.   Mild nonobstructive CAD with 85% smooth narrowing in the first diagonal branch of the LAD; 10% smooth  narrowing of the ostial  proximal left circumflex coronary artery; and a normal dominant RCA.  Severe LV dysfunction with more pronounced mid, distal anterolateral, apical and distal inferior hypocontractility suggestive of a Takotsubo cardiomyopathy pattern with an ejection fraction of 20-25%.      ASSESSMENT:    1. Takotsubo cardiomyopathy   2. Coronary artery disease involving native coronary artery of native heart without angina pectoris   3. Essential hypertension   4. Hyperlipidemia, unspecified hyperlipidemia type   5. Controlled type 2 diabetes mellitus without complication, with long-term current use of insulin (HCC)   6. Abdominal abscess (HCC)      PLAN:  In order of problems listed above:  1. Takotsubo cardiomyopathy: She had cardiac arrest in the setting of Takotsubo cardiomyopathy during previous hospitalization also in the setting of abdominal abscess and diverticulitis. Since then, she has recovered well, her EF at that time was 30-35%. There was some discussion of life fast upon discharge, however this was not done. I will hold off on lifevest at this time, I will obtain a repeat echocardiogram, if EF remains low, may consider LifeVest and the EP follow-up.  2. Abdominal abscess: She currently has a draining, she will follow-up with interventional radiology tomorrow  3. HTN: Blood pressure stable at 109/65. Consolidate lisinopril to 5 mg daily. We'll continue metoprolol tartrate as this time, if EF is low, we'll consider transitioned to Toprol-XL.  4. HLD: On 80 mg daily to Lipitor.  5. DM II: On insulin.    Medication Adjustments/Labs and Tests Ordered: Current medicines are reviewed at length with the patient today.  Concerns regarding medicines are outlined above.  Medication changes, Labs and Tests ordered today are listed in the Patient Instructions below. Patient Instructions  Medication Instructions:  START LISINOPRIL '5MG'$  DAILY  If you need a refill on your cardiac medications  before your next appointment, please call your pharmacy.  Labwork: NONE  Testing/Procedures: Your physician has requested that you have an echocardiogram. Echocardiography is a painless test that uses sound waves to create images of your heart. It provides your doctor with information about the size and shape of your heart and how well your heart's chambers and valves are working. This procedure takes approximately one hour. There are no restrictions for this procedure.   Follow-Up: Your physician recommends that you schedule a follow-up appointment in: 2 [place]    Thank you for choosing [facility], LPN Carlisha Wisler [facility], PA-C    Signed, Almyra Deforest, [place]  11/23/2016 2:56 PM    [address], [place], [place]  [address] Phone: [phone]; Fax: [phone]

## 2016-11-24 ENCOUNTER — Ambulatory Visit
Admission: RE | Admit: 2016-11-24 | Discharge: 2016-11-24 | Disposition: A | Discharge status: Home / Self Care | Payer: Medicare Other | Source: Ambulatory Visit | Attending: General Surgery | Admitting: General Surgery

## 2016-11-24 ENCOUNTER — Other Ambulatory Visit: Payer: Self-pay | Admitting: General Surgery

## 2016-11-24 ENCOUNTER — Encounter: Payer: Self-pay | Admitting: Radiology

## 2016-11-24 DIAGNOSIS — K5720 Diverticulitis of large intestine with perforation and abscess without bleeding: Secondary | ICD-10-CM

## 2016-11-24 HISTORY — PX: IR GENERIC HISTORICAL: IMG1180011

## 2016-11-24 NOTE — Progress Notes (Signed)
Chief Complaint: Diverticular abscess Drain in place  Referring Physician(s): Ramirez,Armando  Supervising Physician: Markus Daft  History of Present Illness: [name] is a 71 y.o. female who underwent tentatively successful percutaneous drainage catheter placement on 09/12/2016 by Dr. Kathlene Cote for a diverticular abscess.   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   She returned again on 1/3 and on 11/03/2016 for injection which again revealed persistent fistula.  She returns again today for another injection.   She is a very poor historian and answers "I don't know" to every question.  She does not know if she is taking antibiotics. She states "I take so may pills I have no idea what they are".  She is accompanied by her sister again today who really is no help either.  She is better at quantifying the drainage amount today and states "it's fills up to about where the letters are" which is about 40-50 mL.  They are  flushing the drain as instructed with only about 2-3 mL per day.  She does deny abdominal pain, fever/chills.   Past Medical History:  Diagnosis Date  . Age-related nuclear cataract of both eyes 2016   +cortical age related cataracts OU   . Allergy    seasonal  . Arthritis    hands  . Bilateral diabetic retinopathy (HCC) 2015   Dr. Zigmund Daniel  . Blood transfusion without reported diagnosis    when taking chemo  . CAD (coronary artery disease)    NON obstructive. Cath 2006 preserved LV fxn, scattered irregularities without critical stenosis. 2008 stress echo negative for ischemia, but with hypertensive response  . Chronic renal insufficiency, stage 2 (mild)    GFR 60s  . COPD (chronic obstructive pulmonary  disease) ([place])   . CVA (cerebral vascular accident) ([place]) 09/2016   MRI did show a left-sided small ischemic stroke which was acute but likely incidental (MRI was done b/c pt had TIA sx's in hospital).  Neuro put her on plavix at that time.  . Diabetes mellitus with complication (HCC)    diab retinopathy OU (laser)  . Diverticulitis 05/2016  . GERD (gastroesophageal reflux disease)    protonix  . History of diverticulitis of colon    with abscess; required IR percutaneous drain placed 09/2016.  Marland Kitchen Hyperlipidemia   . Hypertension   . Lung cancer ([place])    non-small cell lung ca, stage III in 05/2011; systemic chemotherapy concurrent with radiation followed by prophylactic cranial irradiation and has been observation since July of 2010 with no evidence for disease recurrence-released from onc f/u 12/2014 (needs annual cxr by PCP).  CXR 08/2015 stable.  . Mild cognitive impairment with memory loss    Likely from brain radiation therapy  . Open-angle glaucoma 2016   Dr. Shirley Muscat, (bilateral)---responding to topical therapy  . Osteopenia 03/2016   03/2016 DEXA T-score -2.2  . 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 ABDOMINAL HYSTERECTOMY  1997  . APPENDECTOMY    . CARDIAC CATHETERIZATION N/A 09/14/2016   Procedure: Left Heart Cath and Coronary Angiography;  Surgeon: Troy Sine, MD;  Location: [facility] [facility];  Service: Cardiovascular;  Laterality: N/A;  . CARDIAC CATHETERIZATION  09/14/2016   EF  25%, akinesis in a pattern of [place] CM.  Marland Kitchen CARPAL TUNNEL RELEASE    . CATARACT EXTRACTION    . CHOLECYSTECTOMY  2002  . COLONOSCOPY  10/24/00   normal.  [employer] hemoccult testing via [place] 06/18/15 was NEG  . IR GENERIC HISTORICAL  09/19/2016   IR SINUS/FIST TUBE CHK-NON GI 09/19/2016 Corrie Mckusick, DO [facility]  . IR GENERIC HISTORICAL  10/06/2016   IR RADIOLOGIST EVAL & MGMT  10/06/2016 [facility]  . IR GENERIC HISTORICAL  10/26/2016   IR RADIOLOGIST EVAL & MGMT 10/26/2016 [facility]  . IR GENERIC HISTORICAL  11/03/2016   IR RADIOLOGIST EVAL & MGMT 11/03/2016 Ardis Rowan, PA-C [facility]  . LEFT HEART CATHETERIZATION WITH CORONARY ANGIOGRAM N/A 05/30/2012   Procedure: LEFT HEART CATHETERIZATION WITH CORONARY ANGIOGRAM;  Surgeon: Burnell Blanks, MD;  Location: [facility];  Service: Cardiovascular;  Laterality: N/A;  . TRANSTHORACIC ECHOCARDIOGRAM  09/14/2016   EF 30-35 %, Akinesis of the mid-apicalanteroseptal myocardium.  Grade I DD.  Mild pulm HTN.    Allergies: Lactose intolerance (gi) and Ibuprofen  Medications: Prior to Admission medications   Medication Sig Start Date End Date Taking? Authorizing Provider  acetaminophen (TYLENOL) 500 MG tablet Take 1,000 mg by mouth every 6 (six) hours as needed for headache (pain).    Historical Provider, MD  albuterol (PROAIR HFA) 108 (90 Base) MCG/ACT inhaler Inhale 2 puffs into the lungs every 6 (six) hours as needed for wheezing or shortness of breath.    Historical Provider, MD  albuterol (PROVENTIL) (2.5 MG/3ML) 0.083% nebulizer solution USE ONE VIAL IN NEBULIZER EVERY 4 HOURS AS NEEDED FOR WHEEZING OR SHORTNESS OF BREATH 12/16/15   [name], MD  atorvastatin (LIPITOR) 80 MG tablet TAKE ONE TABLET BY MOUTH ONCE DAILY Patient taking differently: TAKE ONE TABLET BY MOUTH ONCE DAILY AT BEDTIME 07/18/16   [name], MD  Bromfenac Sodium (PROLENSA) 0.07 % SOLN Place 1 drop into the left eye at bedtime.    Historical Provider, MD  clopidogrel (PLAVIX) 75 MG tablet Take 1 tablet (75 mg total) by mouth daily. 11/21/16   [name], MD  FLOVENT HFA 220 MCG/ACT inhaler INHALE TWO PUFFS BY MOUTH TWICE DAILY 01/12/16   [name], MD  fluticasone (FLONASE) 50 MCG/ACT nasal spray Place 2 sprays into both nostrils at bedtime. 06/25/14   [name], MD  furosemide (LASIX) 40  MG tablet Take 1 tablet (40 mg total) by mouth daily. 09/28/16   Thurnell Lose, MD  HUMULIN 70/30 KWIKPEN (70-30) 100 UNIT/ML PEN Per sliding scale, takes this qAM and aPM:  140-199 - 2 units, 200-250 - 4 units, 251-299 - 6 units,  300-349 - 8 units,  350 or above 10 units. Dispense syringes and needles as needed, Ok to switch to PEN if approved. Substitute to any brand approved. DX DM2, Code E11.65 09/05/16   Historical Provider, MD  lisinopril (PRINIVIL,ZESTRIL) 5 MG tablet Take 0.5 tablets (2.5 mg total) by mouth daily. 11/23/16   Almyra Deforest, PA  loratadine (CLARITIN) 10 MG tablet Take 10 mg by mouth at bedtime.     Historical Provider, MD  Metoprolol Tartrate 37.5 MG TABS Take 37.5 mg by mouth 2 (two) times daily. 09/24/16   Thurnell Lose, MD  nitroGLYCERIN (NITROSTAT) 0.4 MG SL tablet Place 1 tablet (0.4 mg total) under the tongue every 5 (five) minutes as needed. For chest pain 12/31/12   Bruce  Kendall Flack, MD  pantoprazole (PROTONIX) 40 MG tablet TAKE ONE TABLET BY MOUTH ONCE DAILY Patient taking differently: TAKE ONE TABLET BY MOUTH ONCE DAILY AT BEDTIME 10/15/15   [name], MD  potassium chloride (K-DUR) 10 MEQ tablet Take 1 tablet (10 mEq total) by mouth daily. 09/27/16   Thurnell Lose, MD  prednisoLONE acetate (PRED FORTE) 1 % ophthalmic suspension Place 1 drop into the left eye at bedtime.  01/18/16   Historical Provider, MD     Family History  Problem Relation Age of Onset  . Heart failure Mother   . Kidney disease Mother     renal failure  . Diabetes Mother   . Diabetes Father   . Diabetes Brother   . Heart disease Brother   . Heart attack Brother   . Heart attack Brother   . Heart attack Brother   . Colon cancer Neg Hx     Social History   Social History  . Marital status: Married    Spouse name: N/A  . Number of children: N/A  . Years of education: N/A   Occupational History  . Retired Systems analyst Unemployed   Social History Main Topics  . Smoking  status: Former Smoker    Packs/day: 1.00    Years: 45.00    Types: Cigarettes    Quit date: 10/24/2008  . Smokeless tobacco: Never Used  . Alcohol use No  . Drug use: No  . Sexual activity: Not on file   Other Topics Concern  . Not on file   Social History Narrative   Lives in [place], has husband and daughter and son. Daughter's family is living with her and husband at present. She is ambulatory daily without cane or walker.     Review of Systems  Vital Signs: There were no vitals taken for this visit.  Physical Exam  Awake and alert NAD Abdomen soft, NTND Drain in place anterior abdomen About 15 mL of tan milky drainage in gravity bag.   Imaging: Dg Sinus/fist Tube Chk-non Gi  Result Date: 11/03/2016 CLINICAL DATA:  Diverticulitis of large intestine with abscess, unspecified bleeding status EXAM: ABSCESS DRAIN CATHETER INJECTION TECHNIQUE: The procedure, risks (including but not limited to bleeding, infection, organ damage ), benefits, and alternatives were explained to the patient. Questions regarding the procedure were encouraged and answered. The patient understands and consents to the procedure. Survey fluoroscopic inspection reveals stable position of the abscess drain catheter. 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 IMPRESSION: 1. 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 Keep drain catheter in place, to gravity drain bag. Flush 3-5 mL sterile saline daily. Return for repeat injection in 3 weeks. Schedule outpatient surgical consultation. Electronically Signed   By: Lucrezia Europe M.D.   On: 11/03/2016 11:18   Dg Sinus/fist Tube Chk-non Gi  Result Date: 10/26/2016 CLINICAL DATA:  History of pelvic diverticular abscess, post CT-guided percutaneous drainage catheter placement 09/12/2016. Patient was initially seen in interventional radiology drain Clinic on 10/06/2016 (by Dr. Kathlene Cote) with CT imaging demonstrating resolution of the pelvic abscess though  subsequent drainage catheter injection demonstrated persistent fistulous communication with the adjacent sigmoid colon. The patient returns today in interventional radiology drain Clinic for drainage catheter evaluation and management. The patient is no longer flushing the percutaneous drainage catheter. The patient reports approximately 10 cc of output per day though was not maintain diligent records. No worsening abdominal or pelvic pain. No fever or chills. EXAM: ABSCESS INJECTION COMPARISON:  CT  abdomen and pelvis- 10/06/2016; 09/11/2016; CT-guided left lower quadrant percutaneous drainage catheter placement -09/12/2016 CONTRAST:  10 cc Omnipaque 300 - administered via the percutaneous drainage catheter. FLUOROSCOPY TIME:  30 seconds (67 mGy) TECHNIQUE: The patient was positioned supine on the fluoroscopy table. A preprocedural spot fluoroscopic image was obtained of the lower pelvis and existing percutaneous drainage catheter and existing percutaneous drainage catheter Multiple spot fluoroscopic and radiographic images were obtained following the injection of a small amount of contrast via the existing percutaneous drainage catheter. Images reviewed in the procedure was terminated. The catheter was flushed with a small amount of saline and reconnected to a gravity bag. FINDINGS: Preprocedural spot fluoroscopic image demonstrates unchanged positioning of percutaneous drainage catheter with and coiled and locked over the midline of the lower pelvis. Contrast injection demonstrates opacification of the decompressed abscess cavity with brisk communication with the adjacent sigmoid colon. IMPRESSION: Persistent fistulous connection of the decompressed pelvic abscess cavity and the adjacent sigmoid colon. PLAN: - Percutaneous drainage catheter to remain in place. The patient was again instructed to not flush the percutaneous drainage catheter though was encouraged to maintain diligent records regarding drainage  catheter output. - The patient return to the interventional radiology clinic in 2 weeks with repeat percutaneous drainage catheter evaluation and injection. Electronically Signed   By: Sandi Mariscal M.D.   On: 10/26/2016 13:16   Ir Radiologist Eval & Mgmt  Result Date: 11/03/2016 Please refer to "Notes" to see consult details.  Ir Radiologist Eval & Mgmt  Result Date: 10/28/2016 Please refer to "Notes" to see consult details.   Labs:  CBC:  Recent Labs  09/22/16 0532 09/23/16 0213 09/27/16 0206 11/04/16 0921  WBC 10.4 9.7 10.3 9.4  HGB 9.6* 9.6* 10.3* 12.4  HCT 29.2* 29.6* 31.4* 36.7  PLT 211 243 314 301.0    COAGS:  Recent Labs  06/14/16 2032 09/12/16 0422 09/15/16 0450 09/21/16 0815  INR 1.20 1.34 1.29 1.12  APTT 34  --  26  --     BMP:  Recent Labs  09/21/16 0331 09/22/16 0532 09/23/16 0213 09/24/16 0220 09/27/16 0206 11/04/16 0921  NA 140 139 137  --  136 138  K 3.2* 3.8 3.0* 4.1 3.9 3.7  CL 102 103 94*  --  100* 100  CO2 29 25 32  --  29 28  GLUCOSE 137* 102* 213*  --  157* 227*  BUN '8 8 10  '$ --  12 10  CALCIUM 8.5* 8.5* 8.5*  --  9.1 9.1  CREATININE 0.87 0.85 0.99  --  0.90 0.93  GFRNONAA >60 >60 57*  --  >60  --   GFRAA >60 >60 >60  --  >60  --     LIVER FUNCTION TESTS:  Recent Labs  06/14/16 1158 09/11/16 1030 09/12/16 0422 09/16/16 0434 11/04/16 0921  BILITOT 0.9 0.5 0.7  --  0.5  AST 22 17 14*  --  13  ALT 19 16 13*  --  14  ALKPHOS 105 86 67  --  137*  PROT 7.8 6.9 5.2*  --  6.5  ALBUMIN 4.0 3.5 2.6* 2.3* 4.0    TUMOR MARKERS: No results for input(s): AFPTM, CEA, CA199, CHROMGRNA in the last 8760 hours.  Assessment:  S/P percutaneous drainage catheter placement on 09/12/2016 by Dr. Kathlene Cote for a diverticular abscess.  Dr. Anselm Pancoast reviewed injection images today.  He instructed me to pull the drain back about 2 cm.  Drain was re-injected and drain appears to  be in better position. It was secured in place with a  Stat-lock  Patient was instructed to continue flushing the percutaneous drainage catheter with only 2-3 ml per day  She was encouraged to continue diligent records regarding drainage catheter output.  Since he has had this drain since November, she needs to follow up with Dr. [name] to discuss possible surgical options.  Return to our drain clinic again in about 10 days for repeat injection.  Electronically Signed: Murrell Redden PA-C 11/24/2016, 10:28 AM   Please refer to Dr. Moises Blood attestation of this note for management and plan.

## 2016-11-25 ENCOUNTER — Telehealth: Payer: Self-pay | Admitting: Physician Assistant

## 2016-11-25 DIAGNOSIS — I4581 Long QT syndrome: Secondary | ICD-10-CM | POA: Diagnosis not present

## 2016-11-25 DIAGNOSIS — K5720 Diverticulitis of large intestine with perforation and abscess without bleeding: Secondary | ICD-10-CM | POA: Diagnosis not present

## 2016-11-25 DIAGNOSIS — Z7982 Long term (current) use of aspirin: Secondary | ICD-10-CM | POA: Diagnosis not present

## 2016-11-25 DIAGNOSIS — G3184 Mild cognitive impairment, so stated: Secondary | ICD-10-CM | POA: Diagnosis not present

## 2016-11-25 DIAGNOSIS — I119 Hypertensive heart disease without heart failure: Secondary | ICD-10-CM | POA: Diagnosis not present

## 2016-11-25 DIAGNOSIS — I2510 Atherosclerotic heart disease of native coronary artery without angina pectoris: Secondary | ICD-10-CM | POA: Diagnosis not present

## 2016-11-25 DIAGNOSIS — K632 Fistula of intestine: Secondary | ICD-10-CM | POA: Diagnosis not present

## 2016-11-25 DIAGNOSIS — Z85118 Personal history of other malignant neoplasm of bronchus and lung: Secondary | ICD-10-CM | POA: Diagnosis not present

## 2016-11-25 DIAGNOSIS — E11319 Type 2 diabetes mellitus with unspecified diabetic retinopathy without macular edema: Secondary | ICD-10-CM | POA: Diagnosis not present

## 2016-11-25 DIAGNOSIS — Z7901 Long term (current) use of anticoagulants: Secondary | ICD-10-CM | POA: Diagnosis not present

## 2016-11-25 DIAGNOSIS — H4010X0 Unspecified open-angle glaucoma, stage unspecified: Secondary | ICD-10-CM | POA: Diagnosis not present

## 2016-11-25 DIAGNOSIS — J449 Chronic obstructive pulmonary disease, unspecified: Secondary | ICD-10-CM | POA: Diagnosis not present

## 2016-11-25 DIAGNOSIS — Z4803 Encounter for change or removal of drains: Secondary | ICD-10-CM | POA: Diagnosis not present

## 2016-11-25 DIAGNOSIS — Z7984 Long term (current) use of oral hypoglycemic drugs: Secondary | ICD-10-CM | POA: Diagnosis not present

## 2016-11-25 MED ORDER — LISINOPRIL 5 MG PO TABS: 5.0000 mg | ORAL_TABLET | Freq: Every day | ORAL | 3 refills | Status: DC

## 2016-11-25 NOTE — Telephone Encounter (Signed)
Spoke to daughter (on [place]) after review of chart.  Affirmed that the '5mg'$  dose (1 tablet daily) is correct, this was probably defaulted to original dose and missed by nurse when Rx changes made. Advised to have patient take new dose. She has 30 tablets on-hand so these will last a month. Sent correct Rx to pharmacy w instructions on change.  Caller aware of this, voiced thanks for prompt return of call and understanding.

## 2016-11-25 NOTE — Telephone Encounter (Signed)
New Message    Pt c/o medication issue:  1. Name of Medication:  lisinopril (PRINIVIL,ZESTRIL) 5 MG tablet Take 0.5 tablets (2.5 mg total) by mouth daily.     2. How are you currently taking this medication (dosage and times per day)?  Almyra Deforest told her to take whole pill in morning   3. Are you having a reaction (difficulty breathing--STAT)? no  4. What is your medication issue?  They do no have enough medication if she take a whole pill in the morning, and he just phoned in new prescription why was the change not made for the change in instructions

## 2016-11-29 ENCOUNTER — Encounter: Payer: Self-pay | Admitting: Family Medicine

## 2016-11-29 ENCOUNTER — Ambulatory Visit (INDEPENDENT_AMBULATORY_CARE_PROVIDER_SITE_OTHER): Payer: Medicare Other | Admitting: Family Medicine

## 2016-11-29 VITALS — BP 105/63 | HR 98 | Temp 97.8°F | Resp 16 | Ht 62.0 in | Wt 146.2 lb

## 2016-11-29 DIAGNOSIS — K632 Fistula of intestine: Secondary | ICD-10-CM

## 2016-11-29 DIAGNOSIS — I5181 Takotsubo syndrome: Secondary | ICD-10-CM | POA: Diagnosis not present

## 2016-11-29 DIAGNOSIS — E118 Type 2 diabetes mellitus with unspecified complications: Secondary | ICD-10-CM | POA: Diagnosis not present

## 2016-11-29 DIAGNOSIS — R5381 Other malaise: Secondary | ICD-10-CM

## 2016-11-29 DIAGNOSIS — Z794 Long term (current) use of insulin: Secondary | ICD-10-CM

## 2016-11-29 DIAGNOSIS — K5720 Diverticulitis of large intestine with perforation and abscess without bleeding: Secondary | ICD-10-CM | POA: Diagnosis not present

## 2016-11-29 MED ORDER — LISINOPRIL 5 MG PO TABS: 5.0000 mg | ORAL_TABLET | Freq: Every day | ORAL | 3 refills | Status: DC

## 2016-11-29 NOTE — Progress Notes (Signed)
Pre visit review using our clinic review tool, if applicable. No additional management support is needed unless otherwise documented below in the visit note. 

## 2016-11-29 NOTE — Progress Notes (Signed)
OFFICE VISIT  11/29/2016   CC:  Chief Complaint  Patient presents with  . Follow-up    RCI, pt is not fasting.    HPI:    Patient is a 71 y.o. Caucasian female who presents accompanied by sister Jeani Hawking for 3 week f/u recent Takotsubo cardiomyopathy, diverticular abscess, DM 2 and debilitation she has been having as a result of these problems.  She saw cardiologist 11/23/16: plan is to repeat echo and if EF still down then consider life vest and EP follow up.  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  Her drain was pulled back 2 cm and IR felt like she needed to f/u with surgeon b/c of chronicity of this problem and they feel pt is getting to the point of possibly needing surgical intervention.  She saw Dr. [name], surgeon, 11/25/16, and the decision was made to keep the drain in b/c of pt stability currently PLUS potential risk of surgical complications if surgery pursued at this time.  She says energy still down, uses cane sometimes, walker sometimes.  Getting PT in home. [facility] nurse coming in as well. Home glucoses: 166 fasting this morning.  This is a typical number for her lately in morning. 2H PP--patient can't recall and daughter doesn't know.  No known problems with very high or very low glucoses. Other than being fatigued she is feeling well.  She gets her usual DOE at about 20 steps.  No CP, no palpitations.  No LE swelling.  No cough, no wheezing.  No fevers.  No abd pain.  No focal weakness.   Past Medical History:  Diagnosis Date  . Age-related nuclear cataract of both eyes 2016   +cortical age related cataracts OU   . Allergy    seasonal  . Arthritis    hands  . Bilateral diabetic retinopathy (HCC) 2015   Dr. Zigmund Daniel  . Blood transfusion without reported diagnosis    when taking chemo  . CAD (coronary artery disease)    NON obstructive. Cath 2006 preserved LV fxn, scattered irregularities without critical stenosis. 2008 stress  echo negative for ischemia, but with hypertensive response  . Chronic renal insufficiency, stage 2 (mild)    GFR 60s  . COPD (chronic obstructive pulmonary disease) ([place])   . CVA (cerebral vascular accident) ([place]) 09/2016   MRI did show a left-sided small ischemic stroke which was acute but likely incidental (MRI was done b/c pt had TIA sx's in hospital).  Neuro put her on plavix at that time.  . Diabetes mellitus with complication (HCC)    diab retinopathy OU (laser)  . Diverticulitis 05/2016  . GERD (gastroesophageal reflux disease)    protonix  . History of diverticulitis of colon    with abscess; required IR percutaneous drain placed 09/2016.  Marland Kitchen Hyperlipidemia   . Hypertension   . Lung cancer ([place])    non-small cell lung ca, stage III in 05/2011; systemic chemotherapy concurrent with radiation followed by prophylactic cranial irradiation and has been observation since July of 2010 with no evidence for disease recurrence-released from onc f/u 12/2014 (needs annual cxr by PCP).  CXR 08/2015 stable.  . Mild cognitive impairment with memory loss    Likely from brain radiation therapy  . Open-angle glaucoma 2016   Dr. Shirley Muscat, (bilateral)---responding to topical therapy  . Osteopenia 03/2016   03/2016 DEXA T-score -2.2  . 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 ABDOMINAL HYSTERECTOMY  1997  . APPENDECTOMY    . CARDIAC CATHETERIZATION N/A 09/14/2016   Procedure: Left Heart Cath and Coronary Angiography;  Surgeon: Troy Sine, MD;  Location: [facility];  Service: Cardiovascular;  Laterality: N/A;  . CARDIAC CATHETERIZATION  09/14/2016   EF 25%, akinesis in a pattern of Takutsubos CM.  Marland Kitchen CARPAL TUNNEL RELEASE    . CATARACT EXTRACTION    . CHOLECYSTECTOMY  2002  . COLONOSCOPY  10/24/00   normal.  [employer] hemoccult testing via [place] 06/18/15 was NEG  . IR GENERIC HISTORICAL   09/19/2016   IR SINUS/FIST TUBE CHK-NON GI 09/19/2016 Corrie Mckusick, DO [facility]  . IR GENERIC HISTORICAL  10/06/2016   IR RADIOLOGIST EVAL & MGMT 10/06/2016 [facility]  . IR GENERIC HISTORICAL  10/26/2016   IR RADIOLOGIST EVAL & MGMT 10/26/2016 [facility]  . IR GENERIC HISTORICAL  11/03/2016   IR RADIOLOGIST EVAL & MGMT 11/03/2016 Ardis Rowan, PA-C [facility] INTER[facility]  . IR GENERIC HISTORICAL  11/24/2016   IR RADIOLOGIST EVAL & MGMT 11/24/2016 Ardis Rowan, PA-C [facility]  . LEFT HEART CATHETERIZATION WITH CORONARY ANGIOGRAM N/A 05/30/2012   Procedure: LEFT HEART CATHETERIZATION WITH CORONARY ANGIOGRAM;  Surgeon: Burnell Blanks, MD;  Location: [facility] C[facility] LAB;  Service: Cardiovascular;  Laterality: N/A;  . TRANSTHORACIC ECHOCARDIOGRAM  09/14/2016   EF 30-35 %, Akinesis of the mid-apicalanteroseptal myocardium.  Grade I DD.  Mild pulm HTN.  Repeat echo ordered by cardiologist as of 11/23/16.    Outpatient Medications Prior to Visit  Medication Sig Dispense Refill  . acetaminophen (TYLENOL) 500 MG tablet Take 1,000 mg by mouth every 6 (six) hours as needed for headache (pain).    Marland Kitchen albuterol (PROAIR HFA) 108 (90 Base) MCG/ACT inhaler Inhale 2 puffs into the lungs every 6 (six) hours as needed for wheezing or shortness of breath.    Marland Kitchen albuterol (PROVENTIL) (2.5 MG/3ML) 0.083% nebulizer solution USE ONE VIAL IN NEBULIZER EVERY 4 HOURS AS NEEDED FOR WHEEZING OR SHORTNESS OF BREATH 75 mL 3  . atorvastatin (LIPITOR) 80 MG tablet TAKE ONE TABLET BY MOUTH ONCE DAILY (Patient taking differently: TAKE ONE TABLET BY MOUTH ONCE DAILY AT BEDTIME) 90 tablet 1  . Bromfenac Sodium (PROLENSA) 0.07 % SOLN Place 1 drop into the left eye at bedtime.    . clopidogrel (PLAVIX) 75 MG tablet Take 1 tablet (75 mg total) by mouth daily. 30 tablet 6  . FLOVENT HFA 220 MCG/ACT inhaler INHALE TWO PUFFS BY MOUTH TWICE DAILY 12 g 6  . HUMULIN 70/30 KWIKPEN (70-30) 100 UNIT/ML PEN Per sliding  scale, takes this qAM and aPM:  140-199 - 2 units, 200-250 - 4 units, 251-299 - 6 units,  300-349 - 8 units,  350 or above 10 units. Dispense syringes and needles as needed, Ok to switch to PEN if approved. Substitute to any brand approved. DX DM2, Code E11.65    . loratadine (CLARITIN) 10 MG tablet Take 10 mg by mouth at bedtime.     . Metoprolol Tartrate 37.5 MG TABS Take 37.5 mg by mouth 2 (two) times daily.    . nitroGLYCERIN (NITROSTAT) 0.4 MG SL tablet Place 1 tablet (0.4 mg total) under the tongue every 5 (five) minutes as needed. For chest pain 50 tablet 3  . prednisoLONE acetate (PRED FORTE) 1 % ophthalmic suspension Place 1 drop into the left eye at  bedtime.     Marland Kitchen lisinopril (PRINIVIL,ZESTRIL) 5 MG tablet Take 1 tablet (5 mg total) by mouth daily. 30 tablet 3  . fluticasone (FLONASE) 50 MCG/ACT nasal spray Place 2 sprays into both nostrils at bedtime. (Patient not taking: Reported on 11/29/2016) 16 g 3  . furosemide (LASIX) 40 MG tablet Take 1 tablet (40 mg total) by mouth daily. 30 tablet   . pantoprazole (PROTONIX) 40 MG tablet TAKE ONE TABLET BY MOUTH ONCE DAILY (Patient taking differently: TAKE ONE TABLET BY MOUTH ONCE DAILY AT BEDTIME) 30 tablet 12  . potassium chloride (K-DUR) 10 MEQ tablet Take 1 tablet (10 mEq total) by mouth daily.     No facility-administered medications prior to visit.     Allergies  Allergen Reactions  . Lactose Intolerance (Gi) Diarrhea  . Ibuprofen     REACTION: Anxiousness, hyperventilates    ROS As per HPI  PE: Blood pressure 105/63, pulse 98, temperature 97.8 F (36.6 C), temperature source Oral, resp. rate 16, height '5\' 2"'$  (1.575 m), weight 146 lb 4 oz (66.3 kg), SpO2 97 %. Gen: Alert, well appearing.  Patient is oriented to person, place, time, and situation. AFFECT: pleasant, lucid thought and speech. CV: RRR, no m/r/g.   LUNGS: CTA bilat, nonlabored resps, good aeration in all lung fields. ABD: soft, NT/ND.  Percutaneous drain located in  RLQ covered with dry gauze. EXT: no clubbing, cyanosis, or edema.    LABS:  Lab Results  Component Value Date   HGBA1C 7.1 (H) 09/11/2016     Chemistry      Component Value Date/Time   NA 138 11/04/2016 0921   NA 141 01/15/2015 0848   K 3.7 11/04/2016 0921   K 4.0 01/15/2015 0848   CL 100 11/04/2016 0921   CL 105 02/18/2013 0812   CO2 28 11/04/2016 0921   CO2 25 01/15/2015 0848   BUN 10 11/04/2016 0921   BUN 9.0 01/15/2015 0848   CREATININE 0.93 11/04/2016 0921   CREATININE 0.7 01/15/2015 0848      Component Value Date/Time   CALCIUM 9.1 11/04/2016 0921   CALCIUM 9.2 01/15/2015 0848   ALKPHOS 137 (H) 11/04/2016 0921   ALKPHOS 92 01/15/2015 0848   AST 13 11/04/2016 0921   AST 19 01/15/2015 0848   ALT 14 11/04/2016 0921   ALT 20 01/15/2015 0848   BILITOT 0.5 11/04/2016 0921   BILITOT 0.34 01/15/2015 0848     Lab Results  Component Value Date   WBC 9.4 11/04/2016   HGB 12.4 11/04/2016   HCT 36.7 11/04/2016   MCV 87.0 11/04/2016   PLT 301.0 11/04/2016    IMPRESSION AND PLAN:  1) Takotsubo cardiomyopathy: as per cardiology--continue metoprolol, lisinopril, and lasix. Appears euvolemic today.  Awaiting repeat of her echocardiogram.  2) Diverticular abscess, with persistent colonic fistula as per recent IR evaluation. Dr. [name], surgeon, recommends continuing with drain at this time, no surgery at this time.  3) DM 2; stable.  The current medical regimen is effective;  continue present plan and medications.  4) Debilitated pt: she is holding her own at this time.  Gave encouragement.  Continue PT, [facility] nursing.  An After Visit Summary was printed and given to the patient.  FOLLOW UP: Return in about 4 weeks (around 12/27/2016) for routine chronic illness f/u (30 min).--recheck labs at that time.  Signed:  Crissie Sickles, MD           11/29/2016

## 2016-11-30 ENCOUNTER — Telehealth: Payer: Self-pay | Admitting: Family Medicine

## 2016-11-30 DIAGNOSIS — Z85118 Personal history of other malignant neoplasm of bronchus and lung: Secondary | ICD-10-CM | POA: Diagnosis not present

## 2016-11-30 DIAGNOSIS — E11319 Type 2 diabetes mellitus with unspecified diabetic retinopathy without macular edema: Secondary | ICD-10-CM | POA: Diagnosis not present

## 2016-11-30 DIAGNOSIS — I2510 Atherosclerotic heart disease of native coronary artery without angina pectoris: Secondary | ICD-10-CM | POA: Diagnosis not present

## 2016-11-30 DIAGNOSIS — Z7901 Long term (current) use of anticoagulants: Secondary | ICD-10-CM | POA: Diagnosis not present

## 2016-11-30 DIAGNOSIS — I4581 Long QT syndrome: Secondary | ICD-10-CM | POA: Diagnosis not present

## 2016-11-30 DIAGNOSIS — G3184 Mild cognitive impairment, so stated: Secondary | ICD-10-CM | POA: Diagnosis not present

## 2016-11-30 DIAGNOSIS — J449 Chronic obstructive pulmonary disease, unspecified: Secondary | ICD-10-CM | POA: Diagnosis not present

## 2016-11-30 DIAGNOSIS — K5720 Diverticulitis of large intestine with perforation and abscess without bleeding: Secondary | ICD-10-CM | POA: Diagnosis not present

## 2016-11-30 DIAGNOSIS — Z7982 Long term (current) use of aspirin: Secondary | ICD-10-CM | POA: Diagnosis not present

## 2016-11-30 DIAGNOSIS — Z4803 Encounter for change or removal of drains: Secondary | ICD-10-CM | POA: Diagnosis not present

## 2016-11-30 DIAGNOSIS — I119 Hypertensive heart disease without heart failure: Secondary | ICD-10-CM | POA: Diagnosis not present

## 2016-11-30 DIAGNOSIS — H4010X0 Unspecified open-angle glaucoma, stage unspecified: Secondary | ICD-10-CM | POA: Diagnosis not present

## 2016-11-30 DIAGNOSIS — Z7984 Long term (current) use of oral hypoglycemic drugs: Secondary | ICD-10-CM | POA: Diagnosis not present

## 2016-11-30 NOTE — Telephone Encounter (Signed)
FYI

## 2016-11-30 NOTE — Telephone Encounter (Signed)
OK, noted. HR 98 at o/v earlier this week and she was doing great. --PM

## 2016-11-30 NOTE — Telephone Encounter (Signed)
Caller with [facility] states that pt heart rate was 104 and wanted to let Dr. Anitra Lauth know.

## 2016-12-01 ENCOUNTER — Other Ambulatory Visit: Payer: Self-pay | Admitting: *Deleted

## 2016-12-01 ENCOUNTER — Emergency Department (HOSPITAL_COMMUNITY): Payer: Medicare Other

## 2016-12-01 ENCOUNTER — Inpatient Hospital Stay (HOSPITAL_COMMUNITY)
Admission: EM | Admit: 2016-12-01 | Discharge: 2016-12-09 | DRG: 871 | Disposition: A | Discharge status: Home Health | Payer: Medicare Other | Attending: Internal Medicine | Admitting: Internal Medicine

## 2016-12-01 ENCOUNTER — Encounter (HOSPITAL_COMMUNITY): Payer: Self-pay | Admitting: Emergency Medicine

## 2016-12-01 DIAGNOSIS — J9601 Acute respiratory failure with hypoxia: Secondary | ICD-10-CM | POA: Diagnosis not present

## 2016-12-01 DIAGNOSIS — E785 Hyperlipidemia, unspecified: Secondary | ICD-10-CM | POA: Diagnosis not present

## 2016-12-01 DIAGNOSIS — E1122 Type 2 diabetes mellitus with diabetic chronic kidney disease: Secondary | ICD-10-CM | POA: Diagnosis not present

## 2016-12-01 DIAGNOSIS — J441 Chronic obstructive pulmonary disease with (acute) exacerbation: Secondary | ICD-10-CM | POA: Diagnosis present

## 2016-12-01 DIAGNOSIS — I129 Hypertensive chronic kidney disease with stage 1 through stage 4 chronic kidney disease, or unspecified chronic kidney disease: Secondary | ICD-10-CM | POA: Diagnosis present

## 2016-12-01 DIAGNOSIS — K5720 Diverticulitis of large intestine with perforation and abscess without bleeding: Secondary | ICD-10-CM | POA: Diagnosis not present

## 2016-12-01 DIAGNOSIS — K219 Gastro-esophageal reflux disease without esophagitis: Secondary | ICD-10-CM | POA: Diagnosis present

## 2016-12-01 DIAGNOSIS — E11319 Type 2 diabetes mellitus with unspecified diabetic retinopathy without macular edema: Secondary | ICD-10-CM | POA: Diagnosis present

## 2016-12-01 DIAGNOSIS — R918 Other nonspecific abnormal finding of lung field: Secondary | ICD-10-CM | POA: Diagnosis not present

## 2016-12-01 DIAGNOSIS — J189 Pneumonia, unspecified organism: Secondary | ICD-10-CM

## 2016-12-01 DIAGNOSIS — Z87891 Personal history of nicotine dependence: Secondary | ICD-10-CM

## 2016-12-01 DIAGNOSIS — G9340 Encephalopathy, unspecified: Secondary | ICD-10-CM | POA: Diagnosis not present

## 2016-12-01 DIAGNOSIS — J440 Chronic obstructive pulmonary disease with acute lower respiratory infection: Secondary | ICD-10-CM | POA: Diagnosis not present

## 2016-12-01 DIAGNOSIS — IMO0002 Reserved for concepts with insufficient information to code with codable children: Secondary | ICD-10-CM | POA: Diagnosis present

## 2016-12-01 DIAGNOSIS — K632 Fistula of intestine: Secondary | ICD-10-CM | POA: Diagnosis present

## 2016-12-01 DIAGNOSIS — A419 Sepsis, unspecified organism: Principal | ICD-10-CM

## 2016-12-01 DIAGNOSIS — E872 Acidosis: Secondary | ICD-10-CM | POA: Diagnosis not present

## 2016-12-01 DIAGNOSIS — R509 Fever, unspecified: Secondary | ICD-10-CM

## 2016-12-01 DIAGNOSIS — I639 Cerebral infarction, unspecified: Secondary | ICD-10-CM | POA: Diagnosis present

## 2016-12-01 DIAGNOSIS — E118 Type 2 diabetes mellitus with unspecified complications: Secondary | ICD-10-CM

## 2016-12-01 DIAGNOSIS — I10 Essential (primary) hypertension: Secondary | ICD-10-CM

## 2016-12-01 DIAGNOSIS — Z8249 Family history of ischemic heart disease and other diseases of the circulatory system: Secondary | ICD-10-CM

## 2016-12-01 DIAGNOSIS — E876 Hypokalemia: Secondary | ICD-10-CM | POA: Diagnosis present

## 2016-12-01 DIAGNOSIS — M8580 Other specified disorders of bone density and structure, unspecified site: Secondary | ICD-10-CM | POA: Diagnosis present

## 2016-12-01 DIAGNOSIS — R404 Transient alteration of awareness: Secondary | ICD-10-CM | POA: Diagnosis not present

## 2016-12-01 DIAGNOSIS — N182 Chronic kidney disease, stage 2 (mild): Secondary | ICD-10-CM | POA: Diagnosis not present

## 2016-12-01 DIAGNOSIS — Z9221 Personal history of antineoplastic chemotherapy: Secondary | ICD-10-CM

## 2016-12-01 DIAGNOSIS — K5780 Diverticulitis of intestine, part unspecified, with perforation and abscess without bleeding: Secondary | ICD-10-CM | POA: Diagnosis not present

## 2016-12-01 DIAGNOSIS — R109 Unspecified abdominal pain: Secondary | ICD-10-CM

## 2016-12-01 DIAGNOSIS — I5043 Acute on chronic combined systolic (congestive) and diastolic (congestive) heart failure: Secondary | ICD-10-CM | POA: Diagnosis present

## 2016-12-01 DIAGNOSIS — G9341 Metabolic encephalopathy: Secondary | ICD-10-CM | POA: Diagnosis present

## 2016-12-01 DIAGNOSIS — G3184 Mild cognitive impairment, so stated: Secondary | ICD-10-CM | POA: Diagnosis present

## 2016-12-01 DIAGNOSIS — Z7951 Long term (current) use of inhaled steroids: Secondary | ICD-10-CM

## 2016-12-01 DIAGNOSIS — I472 Ventricular tachycardia: Secondary | ICD-10-CM | POA: Diagnosis not present

## 2016-12-01 DIAGNOSIS — Z794 Long term (current) use of insulin: Secondary | ICD-10-CM

## 2016-12-01 DIAGNOSIS — Z923 Personal history of irradiation: Secondary | ICD-10-CM

## 2016-12-01 DIAGNOSIS — Z79899 Other long term (current) drug therapy: Secondary | ICD-10-CM

## 2016-12-01 DIAGNOSIS — I5042 Chronic combined systolic (congestive) and diastolic (congestive) heart failure: Secondary | ICD-10-CM | POA: Diagnosis not present

## 2016-12-01 DIAGNOSIS — Z9889 Other specified postprocedural states: Secondary | ICD-10-CM

## 2016-12-01 DIAGNOSIS — M19042 Primary osteoarthritis, left hand: Secondary | ICD-10-CM | POA: Diagnosis present

## 2016-12-01 DIAGNOSIS — Z841 Family history of disorders of kidney and ureter: Secondary | ICD-10-CM

## 2016-12-01 DIAGNOSIS — R000 Tachycardia, unspecified: Secondary | ICD-10-CM | POA: Diagnosis not present

## 2016-12-01 DIAGNOSIS — Z886 Allergy status to analgesic agent status: Secondary | ICD-10-CM

## 2016-12-01 DIAGNOSIS — R531 Weakness: Secondary | ICD-10-CM | POA: Diagnosis not present

## 2016-12-01 DIAGNOSIS — Z8673 Personal history of transient ischemic attack (TIA), and cerebral infarction without residual deficits: Secondary | ICD-10-CM | POA: Diagnosis not present

## 2016-12-01 DIAGNOSIS — I959 Hypotension, unspecified: Secondary | ICD-10-CM | POA: Diagnosis not present

## 2016-12-01 DIAGNOSIS — E1139 Type 2 diabetes mellitus with other diabetic ophthalmic complication: Secondary | ICD-10-CM | POA: Diagnosis present

## 2016-12-01 DIAGNOSIS — Y95 Nosocomial condition: Secondary | ICD-10-CM | POA: Diagnosis present

## 2016-12-01 DIAGNOSIS — Z8674 Personal history of sudden cardiac arrest: Secondary | ICD-10-CM

## 2016-12-01 DIAGNOSIS — D72829 Elevated white blood cell count, unspecified: Secondary | ICD-10-CM | POA: Diagnosis not present

## 2016-12-01 DIAGNOSIS — E1165 Type 2 diabetes mellitus with hyperglycemia: Secondary | ICD-10-CM | POA: Diagnosis present

## 2016-12-01 DIAGNOSIS — J9690 Respiratory failure, unspecified, unspecified whether with hypoxia or hypercapnia: Secondary | ICD-10-CM | POA: Diagnosis not present

## 2016-12-01 DIAGNOSIS — Z85118 Personal history of other malignant neoplasm of bronchus and lung: Secondary | ICD-10-CM | POA: Diagnosis not present

## 2016-12-01 DIAGNOSIS — R197 Diarrhea, unspecified: Secondary | ICD-10-CM | POA: Diagnosis present

## 2016-12-01 DIAGNOSIS — J9811 Atelectasis: Secondary | ICD-10-CM | POA: Diagnosis not present

## 2016-12-01 DIAGNOSIS — Z9049 Acquired absence of other specified parts of digestive tract: Secondary | ICD-10-CM

## 2016-12-01 DIAGNOSIS — I130 Hypertensive heart and chronic kidney disease with heart failure and stage 1 through stage 4 chronic kidney disease, or unspecified chronic kidney disease: Secondary | ICD-10-CM | POA: Diagnosis not present

## 2016-12-01 DIAGNOSIS — Z9849 Cataract extraction status, unspecified eye: Secondary | ICD-10-CM

## 2016-12-01 DIAGNOSIS — Z800 Family history of malignant neoplasm of digestive organs: Secondary | ICD-10-CM

## 2016-12-01 DIAGNOSIS — H4010X0 Unspecified open-angle glaucoma, stage unspecified: Secondary | ICD-10-CM | POA: Diagnosis present

## 2016-12-01 DIAGNOSIS — E739 Lactose intolerance, unspecified: Secondary | ICD-10-CM | POA: Diagnosis present

## 2016-12-01 DIAGNOSIS — R1111 Vomiting without nausea: Secondary | ICD-10-CM | POA: Diagnosis not present

## 2016-12-01 DIAGNOSIS — I2510 Atherosclerotic heart disease of native coronary artery without angina pectoris: Secondary | ICD-10-CM | POA: Diagnosis present

## 2016-12-01 DIAGNOSIS — K5732 Diverticulitis of large intestine without perforation or abscess without bleeding: Secondary | ICD-10-CM | POA: Diagnosis not present

## 2016-12-01 DIAGNOSIS — Z7952 Long term (current) use of systemic steroids: Secondary | ICD-10-CM

## 2016-12-01 DIAGNOSIS — Z791 Long term (current) use of non-steroidal anti-inflammatories (NSAID): Secondary | ICD-10-CM

## 2016-12-01 DIAGNOSIS — Z833 Family history of diabetes mellitus: Secondary | ICD-10-CM

## 2016-12-01 DIAGNOSIS — Z7902 Long term (current) use of antithrombotics/antiplatelets: Secondary | ICD-10-CM

## 2016-12-01 DIAGNOSIS — J181 Lobar pneumonia, unspecified organism: Secondary | ICD-10-CM | POA: Diagnosis not present

## 2016-12-01 DIAGNOSIS — M19041 Primary osteoarthritis, right hand: Secondary | ICD-10-CM | POA: Diagnosis present

## 2016-12-01 DIAGNOSIS — Z9689 Presence of other specified functional implants: Secondary | ICD-10-CM | POA: Diagnosis present

## 2016-12-01 DIAGNOSIS — R0989 Other specified symptoms and signs involving the circulatory and respiratory systems: Secondary | ICD-10-CM | POA: Diagnosis not present

## 2016-12-01 DIAGNOSIS — I5032 Chronic diastolic (congestive) heart failure: Secondary | ICD-10-CM | POA: Diagnosis present

## 2016-12-01 DIAGNOSIS — R0602 Shortness of breath: Secondary | ICD-10-CM | POA: Diagnosis not present

## 2016-12-01 DIAGNOSIS — Z7982 Long term (current) use of aspirin: Secondary | ICD-10-CM

## 2016-12-01 LAB — COMPREHENSIVE METABOLIC PANEL
ALBUMIN: 3 g/dL — AB (ref 3.5–5.0)
ALK PHOS: 93 U/L (ref 38–126)
ALT: 15 U/L (ref 14–54)
ANION GAP: 10 (ref 5–15)
AST: 20 U/L (ref 15–41)
BUN: 10 mg/dL (ref 6–20)
CO2: 21 mmol/L — AB (ref 22–32)
Calcium: 8.2 mg/dL — ABNORMAL LOW (ref 8.9–10.3)
Chloride: 104 mmol/L (ref 101–111)
Creatinine, Ser: 0.91 mg/dL (ref 0.44–1.00)
GFR calc Af Amer: >60 mL/min (ref >60)
GFR calc non Af Amer: >60 mL/min (ref >60)
GLUCOSE: 172 mg/dL — AB (ref 65–99)
Potassium: 3.2 mmol/L — ABNORMAL LOW (ref 3.5–5.1)
SODIUM: 135 mmol/L (ref 135–145)
Total Bilirubin: 0.8 mg/dL (ref 0.3–1.2)
Total Protein: 5.8 g/dL — ABNORMAL LOW (ref 6.5–8.1)

## 2016-12-01 LAB — I-STAT CG4 LACTIC ACID, ED
LACTIC ACID, VENOUS: 2.46 mmol/L — AB (ref 0.5–1.9)
Lactic Acid, Venous: 1.85 mmol/L (ref 0.5–1.9)

## 2016-12-01 LAB — APTT: APTT: 30 s (ref 24–36)

## 2016-12-01 LAB — CBC WITH DIFFERENTIAL/PLATELET
Basophils Absolute: 0 10*3/uL (ref 0.0–0.1)
Basophils Relative: 0 %
Eosinophils Absolute: 0 10*3/uL (ref 0.0–0.7)
Eosinophils Relative: 0 %
HEMATOCRIT: 33.7 % — AB (ref 36.0–46.0)
Hemoglobin: 10.9 g/dL — ABNORMAL LOW (ref 12.0–15.0)
LYMPHS ABS: 0.5 10*3/uL — AB (ref 0.7–4.0)
LYMPHS PCT: 4 %
MCH: 28.4 pg (ref 26.0–34.0)
MCHC: 32.3 g/dL (ref 30.0–36.0)
MCV: 87.8 fL (ref 78.0–100.0)
MONO ABS: 0.7 10*3/uL (ref 0.1–1.0)
MONOS PCT: 6 %
NEUTROS ABS: 10 10*3/uL — AB (ref 1.7–7.7)
Neutrophils Relative %: 90 %
Platelets: 164 10*3/uL (ref 150–400)
RBC: 3.84 MIL/uL — ABNORMAL LOW (ref 3.87–5.11)
RDW: 15 % (ref 11.5–15.5)
WBC: 11.2 10*3/uL — ABNORMAL HIGH (ref 4.0–10.5)

## 2016-12-01 LAB — PROTIME-INR
INR: 1.33
PROTHROMBIN TIME: 16.6 s — AB (ref 11.4–15.2)

## 2016-12-01 LAB — PROCALCITONIN: Procalcitonin: 5.05 ng/mL

## 2016-12-01 LAB — TROPONIN I: TROPONIN I: 0.04 ng/mL — AB (ref <0.03)

## 2016-12-01 LAB — I-STAT TROPONIN, ED: Troponin i, poc: 0.01 ng/mL (ref 0.00–0.08)

## 2016-12-01 LAB — CBG MONITORING, ED: Glucose-Capillary: 170 mg/dL — ABNORMAL HIGH (ref 65–99)

## 2016-12-01 LAB — BRAIN NATRIURETIC PEPTIDE: B Natriuretic Peptide: 98.4 pg/mL (ref 0.0–100.0)

## 2016-12-01 LAB — MAGNESIUM: Magnesium: 1 mg/dL — ABNORMAL LOW (ref 1.7–2.4)

## 2016-12-01 MED ORDER — ENOXAPARIN SODIUM 40 MG/0.4ML ~~LOC~~ SOLN
40.0000 mg | SUBCUTANEOUS | Status: DC
  Administered 2016-12-02 – 2016-12-08 (×7): 40 mg via SUBCUTANEOUS
  Filled 2016-12-01 (×8): qty 0.4

## 2016-12-01 MED ORDER — INSULIN ASPART 100 UNIT/ML ~~LOC~~ SOLN: 0.0000 [IU] | Freq: Every day | SUBCUTANEOUS | Status: DC

## 2016-12-01 MED ORDER — PIPERACILLIN-TAZOBACTAM 3.375 G IVPB 30 MIN
3.3750 g | Freq: Once | INTRAVENOUS | Status: AC
  Administered 2016-12-01: 3.375 g via INTRAVENOUS
  Filled 2016-12-01: qty 50

## 2016-12-01 MED ORDER — SODIUM CHLORIDE 0.9 % IV SOLN
INTRAVENOUS | Status: DC
  Administered 2016-12-01: via INTRAVENOUS

## 2016-12-01 MED ORDER — ASPIRIN 81 MG PO CHEW
81.0000 mg | CHEWABLE_TABLET | Freq: Every day | ORAL | Status: DC
  Administered 2016-12-02 – 2016-12-09 (×8): 81 mg via ORAL
  Filled 2016-12-01 (×8): qty 1

## 2016-12-01 MED ORDER — ALBUTEROL SULFATE (2.5 MG/3ML) 0.083% IN NEBU
2.5000 mg | INHALATION_SOLUTION | RESPIRATORY_TRACT | Status: DC | PRN
  Administered 2016-12-02: 2.5 mg via RESPIRATORY_TRACT
  Filled 2016-12-01: qty 3

## 2016-12-01 MED ORDER — POTASSIUM CHLORIDE 20 MEQ/15ML (10%) PO SOLN
40.0000 meq | Freq: Once | ORAL | Status: AC
  Administered 2016-12-01: 40 meq via ORAL
  Filled 2016-12-01: qty 30

## 2016-12-01 MED ORDER — NITROGLYCERIN 0.4 MG SL SUBL: 0.4000 mg | SUBLINGUAL_TABLET | SUBLINGUAL | Status: DC | PRN

## 2016-12-01 MED ORDER — SODIUM CHLORIDE 0.9 % IV BOLUS (SEPSIS)
1000.0000 mL | Freq: Once | INTRAVENOUS | Status: AC
  Administered 2016-12-01: 1000 mL via INTRAVENOUS

## 2016-12-01 MED ORDER — OSELTAMIVIR PHOSPHATE 30 MG PO CAPS
30.0000 mg | ORAL_CAPSULE | Freq: Once | ORAL | Status: AC
  Administered 2016-12-01: 30 mg via ORAL
  Filled 2016-12-01: qty 1

## 2016-12-01 MED ORDER — INSULIN ASPART 100 UNIT/ML ~~LOC~~ SOLN: 0.0000 [IU] | Freq: Three times a day (TID) | SUBCUTANEOUS | Status: DC

## 2016-12-01 MED ORDER — METOPROLOL TARTRATE 25 MG PO TABS: 37.5000 mg | ORAL_TABLET | Freq: Two times a day (BID) | ORAL | Status: DC

## 2016-12-01 MED ORDER — PANTOPRAZOLE SODIUM 40 MG PO TBEC
40.0000 mg | DELAYED_RELEASE_TABLET | Freq: Every day | ORAL | Status: DC
  Administered 2016-12-02 – 2016-12-09 (×8): 40 mg via ORAL
  Filled 2016-12-01 (×8): qty 1

## 2016-12-01 MED ORDER — VANCOMYCIN HCL 500 MG IV SOLR
500.0000 mg | Freq: Two times a day (BID) | INTRAVENOUS | Status: DC
  Administered 2016-12-02 – 2016-12-05 (×7): 500 mg via INTRAVENOUS
  Filled 2016-12-01 (×10): qty 500

## 2016-12-01 MED ORDER — ACETAMINOPHEN 325 MG PO TABS: 650.0000 mg | ORAL_TABLET | Freq: Four times a day (QID) | ORAL | Status: DC | PRN

## 2016-12-01 MED ORDER — LORATADINE 10 MG PO TABS
10.0000 mg | ORAL_TABLET | Freq: Every day | ORAL | Status: DC
  Administered 2016-12-01 – 2016-12-08 (×8): 10 mg via ORAL
  Filled 2016-12-01 (×8): qty 1

## 2016-12-01 MED ORDER — CLOPIDOGREL BISULFATE 75 MG PO TABS
75.0000 mg | ORAL_TABLET | Freq: Every day | ORAL | Status: DC
  Administered 2016-12-02 – 2016-12-09 (×8): 75 mg via ORAL
  Filled 2016-12-01 (×8): qty 1

## 2016-12-01 MED ORDER — DEXTROSE 5 % IV SOLN
500.0000 mg | INTRAVENOUS | Status: DC
  Administered 2016-12-01: 500 mg via INTRAVENOUS
  Filled 2016-12-01: qty 500

## 2016-12-01 MED ORDER — VANCOMYCIN HCL IN DEXTROSE 1-5 GM/200ML-% IV SOLN
1000.0000 mg | Freq: Once | INTRAVENOUS | Status: AC
  Administered 2016-12-01: 1000 mg via INTRAVENOUS
  Filled 2016-12-01: qty 200

## 2016-12-01 MED ORDER — ATORVASTATIN CALCIUM 80 MG PO TABS
80.0000 mg | ORAL_TABLET | Freq: Every day | ORAL | Status: DC
  Administered 2016-12-02 – 2016-12-09 (×8): 80 mg via ORAL
  Filled 2016-12-01 (×9): qty 1

## 2016-12-01 MED ORDER — IPRATROPIUM-ALBUTEROL 0.5-2.5 (3) MG/3ML IN SOLN
3.0000 mL | RESPIRATORY_TRACT | Status: DC
  Administered 2016-12-01 – 2016-12-03 (×8): 3 mL via RESPIRATORY_TRACT
  Filled 2016-12-01 (×10): qty 3

## 2016-12-01 MED ORDER — OSELTAMIVIR PHOSPHATE 75 MG PO CAPS: 75.0000 mg | ORAL_CAPSULE | Freq: Once | ORAL | Status: DC

## 2016-12-01 MED ORDER — DM-GUAIFENESIN ER 30-600 MG PO TB12
1.0000 | ORAL_TABLET | Freq: Two times a day (BID) | ORAL | Status: DC
  Administered 2016-12-01: 1 via ORAL
  Filled 2016-12-01: qty 1

## 2016-12-01 MED ORDER — PIPERACILLIN-TAZOBACTAM 3.375 G IVPB
3.3750 g | Freq: Three times a day (TID) | INTRAVENOUS | Status: DC
  Administered 2016-12-02 – 2016-12-05 (×10): 3.375 g via INTRAVENOUS
  Filled 2016-12-01 (×11): qty 50

## 2016-12-01 MED ORDER — SODIUM CHLORIDE 0.9 % IV BOLUS (SEPSIS)
250.0000 mL | Freq: Once | INTRAVENOUS | Status: AC
  Administered 2016-12-01: 250 mL via INTRAVENOUS

## 2016-12-01 NOTE — Progress Notes (Signed)
Pharmacy Antibiotic Note  [name] is a 71 y.o. female admitted on 12/01/2016 with sepsis.  Pharmacy has been consulted for vancomycin and Zosyn dosing. WBC/lactate elevated, cultures pending.  Plan: -Vancomycin '1000mg'$  IV x1 then '500mg'$  IV q12h -Zosyn 3.375g IV x1 over 30 min -Zosyn 3.375g IV q8h EI -Monitor renal function, cultures, LOT, vancomycin level as indicated  Height: '5\' 2"'$  (157.5 cm) Weight: 146 lb (66.2 kg) IBW/kg (Calculated) : 50.1  Temp (24hrs), Avg:100.1 F (37.8 C), Min:100.1 F (37.8 C), Max:100.1 F (37.8 C)  No results for input(s): WBC, CREATININE, LATICACIDVEN, VANCOTROUGH, VANCOPEAK, VANCORANDOM, GENTTROUGH, GENTPEAK, GENTRANDOM, TOBRATROUGH, TOBRAPEAK, TOBRARND, AMIKACINPEAK, AMIKACINTROU, AMIKACIN in the last 168 hours.  CrCl cannot be calculated (Patient's most recent lab result is older than the maximum 21 days allowed.).    Allergies  Allergen Reactions  . Lactose Intolerance (Gi) Diarrhea  . Ibuprofen     REACTION: Anxiousness, hyperventilates    Antimicrobials this admission: 2/8 Vancomycin >>  2/8 Zosyn >>   Dose adjustments this admission: none  Microbiology results: ordered  Thank you for allowing pharmacy to be a part of this patient's care.  Arrie Senate, PharmD PGY-1 Pharmacy Resident Pager: [phone] 12/01/2016

## 2016-12-01 NOTE — ED Notes (Signed)
Dr. Blaine Hamper present in room, assessing pt. Aware of fluid intake thus far and continued hypotensive state.

## 2016-12-01 NOTE — ED Provider Notes (Signed)
[facility] DEPT Provider Note  CSN: [id number] Arrival date & time: 12/01/16  1918  History   Chief Complaint Chief Complaint  Patient presents with  . Altered Mental Status  . Weakness  . Shortness of Breath   HPI [name] is a 71 y.o. female.  The history is provided by the patient and medical records. The history is limited by the condition of the patient. No language interpreter was used.  Illness  This is a new problem. The current episode started more than 2 days ago. The problem occurs constantly. The problem has been gradually worsening. Associated symptoms include shortness of breath. Nothing aggravates the symptoms. Nothing relieves the symptoms.   Past Medical History:  Diagnosis Date  . Age-related nuclear cataract of both eyes 2016   +cortical age related cataracts OU   . Allergy    seasonal  . Arthritis    hands  . Bilateral diabetic retinopathy (HCC) 2015   Dr. Zigmund Daniel  . Blood transfusion without reported diagnosis    when taking chemo  . CAD (coronary artery disease)    NON obstructive. Cath 2006 preserved LV fxn, scattered irregularities without critical stenosis. 2008 stress echo negative for ischemia, but with hypertensive response  . Chronic combined systolic and diastolic congestive heart failure ([place]) 12/01/2016  . Chronic renal insufficiency, stage 2 (mild)    GFR 60s  . COPD (chronic obstructive pulmonary disease) ([place])   . CVA (cerebral vascular accident) ([place]) 09/2016   MRI did show a left-sided small ischemic stroke which was acute but likely incidental (MRI was done b/c pt had TIA sx's in hospital).  Neuro put her on plavix at that time.  . Diabetes mellitus with complication (HCC)    diab retinopathy OU (laser)  . Diverticulitis 05/2016  . GERD (gastroesophageal reflux disease)    protonix  . History of diverticulitis of colon    with abscess; required IR percutaneous drain placed 09/2016.  Marland Kitchen Hyperlipidemia   . Hypertension   .  Lung cancer ([place])    non-small cell lung ca, stage III in 05/2011; systemic chemotherapy concurrent with radiation followed by prophylactic cranial irradiation and has been observation since July of 2010 with no evidence for disease recurrence-released from onc f/u 12/2014 (needs annual cxr by PCP).  CXR 08/2015 stable.  . Mild cognitive impairment with memory loss    Likely from brain radiation therapy  . Open-angle glaucoma 2016   Dr. Shirley Muscat, (bilateral)---responding to topical therapy  . Osteopenia 03/2016   03/2016 DEXA T-score -2.2  . 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 CAP (community acquired pneumonia) 12/01/2016  . Essential hypertension 12/01/2016  . Acute encephalopathy 12/01/2016  . Chronic combined systolic and diastolic congestive heart failure ([place]) 12/01/2016  . Cerebrovascular accident (CVA) ([place])   . Hypokalemia 09/17/2016  . Hypertension associated with chronic kidney disease due to type 2 diabetes mellitus ([place]) 09/17/2016  . Cardiac arrest with ventricular fibrillation ([place]) 09/14/2016  . Takotsubo cardiomyopathy 09/14/2016  . Acute respiratory failure ([place])   . Diverticulitis of large intestine with perforation and abscess 09/11/2016  . Debilitated patient 07/08/2016  . Arterial hypotension   . Sepsis ([place]) 06/14/2016  . Controlled type 2 diabetes mellitus with hyperglycemia, with long-term current use of insulin ([place]) 06/14/2016  . Hyperlipidemia 06/14/2016  . Non-occlusive coronary artery disease 06/06/2012  . TOBACCO USE, QUIT 07/29/2009  .  Uncontrolled type 2 diabetes mellitus with complication ([place]) 13/05/6577  . GERD (gastroesophageal reflux disease) 04/24/2007   Past Surgical History:  Procedure Laterality Date  . ABDOMINAL HYSTERECTOMY  1997  . APPENDECTOMY    . CARDIAC CATHETERIZATION N/A 09/14/2016   Procedure: Left Heart Cath and Coronary  Angiography;  Surgeon: Troy Sine, MD;  Location: [facility];  Service: Cardiovascular;  Laterality: N/A;  . CARDIAC CATHETERIZATION  09/14/2016   EF 25%, akinesis in a pattern of [place] CM.  Marland Kitchen CARPAL TUNNEL RELEASE    . CATARACT EXTRACTION    . CHOLECYSTECTOMY  2002  . COLONOSCOPY  10/24/00   normal.  [employer] hemoccult testing via [place] 06/18/15 was NEG  . IR GENERIC HISTORICAL  09/19/2016   IR SINUS/FIST TUBE CHK-NON GI 09/19/2016 Corrie Mckusick, DO [facility]  . IR GENERIC HISTORICAL  10/06/2016   IR RADIOLOGIST EVAL & MGMT 10/06/2016 [facility]  . IR GENERIC HISTORICAL  10/26/2016   IR RADIOLOGIST EVAL & MGMT 10/26/2016 [facility]  . IR GENERIC HISTORICAL  11/03/2016   IR RADIOLOGIST EVAL & MGMT 11/03/2016 Ardis Rowan, PA-C [facility]  . IR GENERIC HISTORICAL  11/24/2016   IR RADIOLOGIST EVAL & MGMT 11/24/2016 Ardis Rowan, PA-C [facility]  . LEFT HEART CATHETERIZATION WITH CORONARY ANGIOGRAM N/A 05/30/2012   Procedure: LEFT HEART CATHETERIZATION WITH CORONARY ANGIOGRAM;  Surgeon: Burnell Blanks, MD;  Location: [facility] CATH LAB;  Service: Cardiovascular;  Laterality: N/A;  . TRANSTHORACIC ECHOCARDIOGRAM  09/14/2016   EF 30-35 %, Akinesis of the mid-apicalanteroseptal myocardium.  Grade I DD.  Mild pulm HTN.  Repeat echo ordered by cardiologist as of 11/23/16.   OB History    No data available     Home Medications    Prior to Admission medications   Medication Sig Start Date End Date Taking? Authorizing Provider  acetaminophen (TYLENOL) 500 MG tablet Take 500-1,000 mg by mouth every 6 (six) hours as needed for headache (or pain).    Yes Historical Provider, MD  albuterol (PROAIR HFA) 108 (90 Base) MCG/ACT inhaler Inhale 2 puffs into the lungs every 6 (six) hours as needed for wheezing or shortness of breath.   Yes Historical Provider, MD  albuterol (PROVENTIL) (2.5 MG/3ML) 0.083% nebulizer solution USE ONE VIAL IN NEBULIZER EVERY 4  HOURS AS NEEDED FOR WHEEZING OR SHORTNESS OF BREATH 12/16/15  Yes [name], MD  aspirin 81 MG chewable tablet Chew 81 mg by mouth daily.   Yes Historical Provider, MD  atorvastatin (LIPITOR) 80 MG tablet TAKE ONE TABLET BY MOUTH ONCE DAILY Patient taking differently: TAKE ONE TABLET BY MOUTH ONCE DAILY AT BEDTIME 07/18/16  Yes [name], MD  clopidogrel (PLAVIX) 75 MG tablet Take 1 tablet (75 mg total) by mouth daily. 11/21/16  Yes [name], MD  FLOVENT HFA 220 MCG/ACT inhaler INHALE TWO PUFFS BY MOUTH TWICE DAILY 01/12/16  Yes [name], MD  furosemide (LASIX) 40 MG tablet Take 1 tablet (40 mg total) by mouth daily. 09/28/16  Yes Thurnell Lose, MD  HUMULIN 70/30 KWIKPEN (70-30) 100 UNIT/ML PEN Per sliding scale, takes this qAM and aPM:  140-199 - 2 units, 200-250 - 4 units, 251-299 - 6 units,  300-349 - 8 units,  350 or above 10 units. Dispense syringes and needles as needed, Ok to switch to PEN if approved. Substitute to any brand approved. DX DM2, Code E11.65 09/05/16  Yes Historical Provider,  MD  lisinopril (PRINIVIL,ZESTRIL) 5 MG tablet Take 1 tablet (5 mg total) by mouth daily. 11/29/16  Yes [name], MD  loratadine (CLARITIN) 10 MG tablet Take 10 mg by mouth at bedtime.    Yes Historical Provider, MD  Metoprolol Tartrate 37.5 MG TABS Take 37.5 mg by mouth 2 (two) times daily. 09/24/16  Yes Thurnell Lose, MD  nitroGLYCERIN (NITROSTAT) 0.4 MG SL tablet Place 1 tablet (0.4 mg total) under the tongue every 5 (five) minutes as needed. For chest pain 12/31/12  Yes Lisabeth Pick, MD  pantoprazole (PROTONIX) 40 MG tablet TAKE ONE TABLET BY MOUTH ONCE DAILY Patient taking differently: TAKE ONE TABLET BY MOUTH ONCE DAILY AT BEDTIME 10/15/15  Yes [name], MD  potassium chloride (K-DUR) 10 MEQ tablet Take 1 tablet (10 mEq total) by mouth daily. 09/27/16  Yes Thurnell Lose, MD  fluticasone (FLONASE) 50 MCG/ACT nasal spray Place 2 sprays into both nostrils at  bedtime. Patient not taking: Reported on 12/01/2016 06/25/14   [name], MD   Family History Family History  Problem Relation Age of Onset  . Heart failure Mother   . Kidney disease Mother     renal failure  . Diabetes Mother   . Diabetes Father   . Diabetes Brother   . Heart disease Brother   . Heart attack Brother   . Heart attack Brother   . Heart attack Brother   . Colon cancer Neg Hx    Social History Social History  Substance Use Topics  . Smoking status: Former Smoker    Packs/day: 1.00    Years: 45.00    Types: Cigarettes    Quit date: 10/24/2008  . Smokeless tobacco: Never Used  . Alcohol use No   Allergies   Lactose intolerance (gi) and Ibuprofen  Review of Systems Review of Systems  Unable to perform ROS: Acuity of condition  Constitutional: Positive for chills and fever.  Respiratory: Positive for cough, shortness of breath and wheezing.   All other systems reviewed and are negative.  Physical Exam Updated Vital Signs BP (!) 97/48 (BP Location: Right Arm)   Pulse (!) 130   Temp 98.9 F (37.2 C) (Oral)   Resp (!) 32   Ht '5\' 2"'$  (1.575 m)   Wt 66.2 kg   SpO2 95%   BMI 26.70 kg/m   Physical Exam  Constitutional: She is oriented to person, place, and time. She appears distressed.  HENT:  Head: Normocephalic and atraumatic.  Eyes: EOM are normal. Pupils are equal, round, and reactive to light.  Neck: Normal range of motion. Neck supple.  Cardiovascular: Regular rhythm and normal heart sounds.  Tachycardia present.   Borderline hypotensive  Pulmonary/Chest: She is in respiratory distress. She has wheezes.  Tachypnea, increased work of breathing, hypoxic on room air with improvement to mid 90s on 4 L nasal cannula, coarse breath sounds  Abdominal: Soft. Bowel sounds are normal. She exhibits no distension. There is no tenderness.  Musculoskeletal: Normal range of motion.  Neurological: She is alert and oriented to person, place, and time.  Skin:  Skin is warm and dry. Capillary refill takes less than 2 seconds. She is not diaphoretic.  Nursing note and vitals reviewed.  ED Treatments / Results  Labs (all labs ordered are listed, but only abnormal results are displayed) Labs Reviewed  COMPREHENSIVE METABOLIC PANEL - Abnormal; Notable for the following:       Result Value   Potassium 3.2 (*)  CO2 21 (*)    Glucose, Bld 172 (*)    Calcium 8.2 (*)    Total Protein 5.8 (*)    Albumin 3.0 (*)    All other components within normal limits  CBC WITH DIFFERENTIAL/PLATELET - Abnormal; Notable for the following:    WBC 11.2 (*)    RBC 3.84 (*)    Hemoglobin 10.9 (*)    HCT 33.7 (*)    Neutro Abs 10.0 (*)    Lymphs Abs 0.5 (*)    All other components within normal limits  I-STAT CG4 LACTIC ACID, ED - Abnormal; Notable for the following:    Lactic Acid, Venous 2.46 (*)    All other components within normal limits  CULTURE, BLOOD (ROUTINE X 2)  CULTURE, BLOOD (ROUTINE X 2)  CULTURE, EXPECTORATED SPUTUM-ASSESSMENT  GRAM STAIN  BRAIN NATRIURETIC PEPTIDE  URINALYSIS, ROUTINE W REFLEX MICROSCOPIC  INFLUENZA PANEL BY PCR (TYPE A & B)  MAGNESIUM  TSH  T4, FREE  T3, FREE  HEMOGLOBIN A1C  LIPID PANEL  TROPONIN I  TROPONIN I  TROPONIN I  PROCALCITONIN  PROTIME-INR  APTT  STREP PNEUMONIAE URINARY ANTIGEN  LEGIONELLA PNEUMOPHILA SEROGP 1 UR AG  I-STAT TROPOININ, ED  I-STAT CG4 LACTIC ACID, ED   EKG  EKG Interpretation None      Radiology Dg Chest Port 1 View  Result Date: 12/01/2016 CLINICAL DATA:  Patient with coarse breath sounds.  Confusion. EXAM: PORTABLE CHEST 1 VIEW COMPARISON:  Chest CT 09/22/2016; chest radiograph 10/11/2016. FINDINGS: Interval increase in right upper lobe paramediastinal consolidation. Interval development of consolidative opacities within the left mid lower lung. Stable cardiac and mediastinal contours. Probable small left pleural effusion. IMPRESSION: Interval development of consolidation within  the left mid and lower lung which may represent pneumonia in the appropriate clinical setting. Interval increase in consolidation within the paramediastinal right lung. This may represent evolving postradiation change however locally recurrent disease is not excluded. After resolution of the acute symptomatology, recommend correlation with chest CT. Electronically Signed   By: Lovey Newcomer M.D.   On: 12/01/2016 21:10   Procedures Procedures  Medications Ordered in ED Medications  vancomycin (VANCOCIN) 500 mg in sodium chloride 0.9 % 100 mL IVPB (not administered)  piperacillin-tazobactam (ZOSYN) IVPB 3.375 g (not administered)  aspirin chewable tablet 81 mg (not administered)  clopidogrel (PLAVIX) tablet 75 mg (not administered)  metoprolol tartrate (LOPRESSOR) tablet 37.5 mg (not administered)  atorvastatin (LIPITOR) tablet 80 mg (not administered)  albuterol (PROVENTIL) (2.5 MG/3ML) 0.083% nebulizer solution 2.5 mg (not administered)  pantoprazole (PROTONIX) EC tablet 40 mg (not administered)  nitroGLYCERIN (NITROSTAT) SL tablet 0.4 mg (not administered)  loratadine (CLARITIN) tablet 10 mg (not administered)  potassium chloride 20 MEQ/15ML (10%) solution 40 mEq (not administered)  enoxaparin (LOVENOX) injection 40 mg (not administered)  acetaminophen (TYLENOL) tablet 650 mg (not administered)  dextromethorphan-guaiFENesin (MUCINEX DM) 30-600 MG per 12 hr tablet 1 tablet (not administered)  ipratropium-albuterol (DUONEB) 0.5-2.5 (3) MG/3ML nebulizer solution 3 mL (not administered)  insulin aspart (novoLOG) injection 0-9 Units (not administered)  insulin aspart (novoLOG) injection 0-5 Units (not administered)  azithromycin (ZITHROMAX) 500 mg in dextrose 5 % 250 mL IVPB (not administered)  oseltamivir (TAMIFLU) capsule 30 mg (not administered)  sodium chloride 0.9 % bolus 1,000 mL (0 mLs Intravenous Stopped 12/01/16 2011)    And  sodium chloride 0.9 % bolus 1,000 mL (0 mLs Intravenous  Stopped 12/01/16 2028)    And  sodium chloride 0.9 % bolus 250  mL (0 mLs Intravenous Stopped 12/01/16 2155)  piperacillin-tazobactam (ZOSYN) IVPB 3.375 g (0 g Intravenous Stopped 12/01/16 2050)  vancomycin (VANCOCIN) IVPB 1000 mg/200 mL premix (0 mg Intravenous Stopped 12/01/16 2121)   Initial Impression / Assessment and Plan / ED Course  I have reviewed the triage vital signs and the nursing notes.  71 y.o. female with above stated PMHx, HPI, and physical. Patient with past medical history of COPD using albuterol intermittently and without oxygen requirement home. Generalized symptoms over last week including fatigue and confusion per family. Patient now with worsening shortness of breath, fever, chills, and cough.  Patient febrile, tachycardic, and borderline hypotensive. Concern for sepsis. Blood cultures drawn. Patient covered with broad-spectrum antibiotics. Given IV fluids. Chest x-ray concerning for pneumonia.  Laboratory and imaging results were personally reviewed by myself and used in the medical decision making of this patient's treatment and disposition.  Pt admitted to medicine for further evaluation and management of sepsis 2/2 PNA. Pt understands and agrees with the plan and has no further questions or concerns.   Pt care discussed with and followed by my attending, Dr. Deno Etienne  Mayer Camel, MD Pager [phone]  Final Clinical Impressions(s) / ED Diagnoses   Final diagnoses:  Sepsis [facility])  Community acquired pneumonia  Leukocytosis  Tachycardia  Fever in adult   New Prescriptions New Prescriptions   No medications on file     Mayer Camel, MD 12/01/16 [place], DO 12/01/16 2250

## 2016-12-01 NOTE — H&P (Signed)
History and Physical    [name]:[id number] DOB: 1946-08-27 DOA: 12/01/2016  Referring MD/NP/PA:   PCP: [name], MD   Patient coming from:  The patient is coming from home.  At baseline, pt is independent for most of ADL.   Chief Complaint: cough, chest pain, fever, AMS, diarrhea  HPI: [name] is a 71 y.o. female with medical history significant of hypertension, hyperlipidemia, diabetes mellitus, COPD, GERD, Torsade-de pointes, NSCLC 2010 (s/p of chemo and XTR, no surgery), stroke, diverticulitis, CAD, combined systolic and diastolic CHF with EF 99-37%, CKD-II, CAD, diverticular abscess with colonic fistular (has draining tube in place), who presents with cough, chest pain, fever, altered mental status, diarrhea.  Patient has AMS, is unable to provide accurate medical history, therefore, most of the history is obtained by discussing the case with ED physician, per EMS report, and with the nursing staff. Per report, pt is brought in due to cough, chest pain, fever, AMS, She has cough with clear mucus production. No flu symptoms, no runny nose or sore throat. Patient has nausea and vomited once at home. She has diarrhea with at least 4 loose stool bowel movement today. She has diverticular abscess with colonic fistula with draining tube in place and therefor she has AP. She moves all extremities.  ED Course: pt was found to have hypotension with a blood pressure 85/47, which she improved to 109/51 with IV fluids. lactate 2.46, WBC 11.2, negative troponin, BNP 98.4, potassium 3.2, magnesium 1.0, creatinine normal, temperature 100.1, tachycardia with heart rate up to 140, tachypnea, oxygen section 96%, [place] showed infiltration in LML and LLL. ABG showed pH of 7.28, PCO2 30.5, PO2 88. Pt is admitted to SDU as inpt. PCCM was consulted.  Review of Systems: Could not be reviewed accurately due to altered mental status.   Allergy:  Allergies  Allergen Reactions  . Lactose  Intolerance (Gi) Diarrhea  . Ibuprofen Other (See Comments)    REACTION: Anxiousness, hyperventilates    Past Medical History:  Diagnosis Date  . Age-related nuclear cataract of both eyes 2016   +cortical age related cataracts OU   . Allergy    seasonal  . Arthritis    hands  . Bilateral diabetic retinopathy (HCC) 2015   Dr. Zigmund Daniel  . Blood transfusion without reported diagnosis    when taking chemo  . CAD (coronary artery disease)    NON obstructive. Cath 2006 preserved LV fxn, scattered irregularities without critical stenosis. 2008 stress echo negative for ischemia, but with hypertensive response  . Chronic combined systolic and diastolic congestive heart failure ([place]) 12/01/2016  . Chronic renal insufficiency, stage 2 (mild)    GFR 60s  . COPD (chronic obstructive pulmonary disease) ([place])   . CVA (cerebral vascular accident) ([place]) 09/2016   MRI did show a left-sided small ischemic stroke which was acute but likely incidental (MRI was done b/c pt had TIA sx's in hospital).  Neuro put her on plavix at that time.  . Diabetes mellitus with complication (HCC)    diab retinopathy OU (laser)  . Diverticulitis 05/2016  . GERD (gastroesophageal reflux disease)    protonix  . History of diverticulitis of colon    with abscess; required IR percutaneous drain placed 09/2016.  Marland Kitchen Hyperlipidemia   . Hypertension   . Lung cancer ([facility])    non-small cell lung ca, stage III in 05/2011; systemic chemotherapy concurrent with radiation followed by prophylactic cranial irradiation and has been observation since July of 2010  with no evidence for disease recurrence-released from onc f/u 12/2014 (needs annual cxr by PCP).  CXR 08/2015 stable.  . Mild cognitive impairment with memory loss    Likely from brain radiation therapy  . Open-angle glaucoma 2016   Dr. Shirley Muscat, (bilateral)---responding to topical therapy  . Osteopenia 03/2016   03/2016 DEXA T-score -2.2  . Torsades de pointes ([place]) 08/2016     avoid meds with potential for QT prolongation    Past Surgical History:  Procedure Laterality Date  . ABDOMINAL HYSTERECTOMY  1997  . APPENDECTOMY    . CARDIAC CATHETERIZATION N/A 09/14/2016   Procedure: Left Heart Cath and Coronary Angiography;  Surgeon: [place] Sine, MD;  Location: [facility];  Service: Cardiovascular;  Laterality: N/A;  . CARDIAC CATHETERIZATION  09/14/2016   EF 25%, akinesis in a pattern of [place] CM.  Marland Kitchen CARPAL TUNNEL RELEASE    . CATARACT EXTRACTION    . CHOLECYSTECTOMY  2002  . COLONOSCOPY  10/24/00   normal.  [employer] hemoccult testing via [place] 06/18/15 was NEG  . IR GENERIC HISTORICAL  09/19/2016   IR SINUS/FIST TUBE CHK-NON GI 09/19/2016 Corrie Mckusick, DO [facility]  . IR GENERIC HISTORICAL  10/06/2016   IR RADIOLOGIST EVAL & MGMT 10/06/2016 [facility]  . IR GENERIC HISTORICAL  10/26/2016   IR RADIOLOGIST EVAL & MGMT 10/26/2016 [facility]  . IR GENERIC HISTORICAL  11/03/2016   IR RADIOLOGIST EVAL & MGMT 11/03/2016 Ardis Rowan, PA-C [facility]  . IR GENERIC HISTORICAL  11/24/2016   IR RADIOLOGIST EVAL & MGMT 11/24/2016 Ardis Rowan, PA-C [facility]  . LEFT HEART CATHETERIZATION WITH CORONARY ANGIOGRAM N/A 05/30/2012   Procedure: LEFT HEART CATHETERIZATION WITH CORONARY ANGIOGRAM;  Surgeon: Burnell Blanks, MD;  [facility];  Service: Cardiovascular;  Laterality: N/A;  . TRANSTHORACIC ECHOCARDIOGRAM  09/14/2016   EF 30-35 %, Akinesis of the mid-apicalanteroseptal myocardium.  Grade I DD.  Mild pulm HTN.  Repeat echo ordered by cardiologist as of 11/23/16.    Social History:  reports that she quit smoking about 8 years ago. Her smoking use included Cigarettes. She has a 45.00 pack-year smoking history. She has never used smokeless tobacco. She reports that she does not drink alcohol or use drugs.  Family History:  Family History  Problem Relation Age of Onset  . Heart failure Mother   .  Kidney disease Mother     renal failure  . Diabetes Mother   . Diabetes Father   . Diabetes Brother   . Heart disease Brother   . Heart attack Brother   . Heart attack Brother   . Heart attack Brother   . Colon cancer Neg Hx      Prior to Admission medications   Medication Sig Start Date End Date Taking? Authorizing Provider  acetaminophen (TYLENOL) 500 MG tablet Take 1,000 mg by mouth every 6 (six) hours as needed for headache (pain).    Historical Provider, MD  albuterol (PROAIR HFA) 108 (90 Base) MCG/ACT inhaler Inhale 2 puffs into the lungs every 6 (six) hours as needed for wheezing or shortness of breath.    Historical Provider, MD  albuterol (PROVENTIL) (2.5 MG/3ML) 0.083% nebulizer solution USE ONE VIAL IN NEBULIZER EVERY 4 HOURS AS NEEDED FOR WHEEZING OR SHORTNESS OF BREATH 12/16/15   [name], MD  atorvastatin (LIPITOR) 80 MG tablet TAKE ONE TABLET BY MOUTH ONCE DAILY Patient taking differently: TAKE ONE TABLET  BY MOUTH ONCE DAILY AT BEDTIME 07/18/16   [name], MD  Bromfenac Sodium (PROLENSA) 0.07 % SOLN Place 1 drop into the left eye at bedtime.    Historical Provider, MD  clopidogrel (PLAVIX) 75 MG tablet Take 1 tablet (75 mg total) by mouth daily. 11/21/16   [name], MD  FLOVENT HFA 220 MCG/ACT inhaler INHALE TWO PUFFS BY MOUTH TWICE DAILY 01/12/16   [name], MD  fluticasone (FLONASE) 50 MCG/ACT nasal spray Place 2 sprays into both nostrils at bedtime. Patient not taking: Reported on 11/29/2016 06/25/14   [name], MD  furosemide (LASIX) 40 MG tablet Take 1 tablet (40 mg total) by mouth daily. 09/28/16   Thurnell Lose, MD  HUMULIN 70/30 KWIKPEN (70-30) 100 UNIT/ML PEN Per sliding scale, takes this qAM and aPM:  140-199 - 2 units, 200-250 - 4 units, 251-299 - 6 units,  300-349 - 8 units,  350 or above 10 units. Dispense syringes and needles as needed, Ok to switch to PEN if approved. Substitute to any brand approved. DX DM2, Code E11.65  09/05/16   Historical Provider, MD  lisinopril (PRINIVIL,ZESTRIL) 5 MG tablet Take 1 tablet (5 mg total) by mouth daily. 11/29/16   [name], MD  loratadine (CLARITIN) 10 MG tablet Take 10 mg by mouth at bedtime.     Historical Provider, MD  Metoprolol Tartrate 37.5 MG TABS Take 37.5 mg by mouth 2 (two) times daily. 09/24/16   Thurnell Lose, MD  nitroGLYCERIN (NITROSTAT) 0.4 MG SL tablet Place 1 tablet (0.4 mg total) under the tongue every 5 (five) minutes as needed. For chest pain 12/31/12   Lisabeth Pick, MD  pantoprazole (PROTONIX) 40 MG tablet TAKE ONE TABLET BY MOUTH ONCE DAILY Patient taking differently: TAKE ONE TABLET BY MOUTH ONCE DAILY AT BEDTIME 10/15/15   [name], MD  potassium chloride (K-DUR) 10 MEQ tablet Take 1 tablet (10 mEq total) by mouth daily. 09/27/16   Thurnell Lose, MD  prednisoLONE acetate (PRED FORTE) 1 % ophthalmic suspension Place 1 drop into the left eye at bedtime.  01/18/16   Historical Provider, MD    Physical Exam: Vitals:   12/02/16 0530 12/02/16 0545 12/02/16 0600 12/02/16 0630  BP: (!) 121/51 (!) 113/51 (!) 104/53 (!) 120/55  Pulse: (!) 110 (!) 109 (!) 110 (!) 109  Resp: (!) 28 (!) 27 (!) 27 (!) 22  Temp:      TempSrc:      SpO2: 94% 94% 94% 95%  Weight:      Height:       General: Not in acute distress HEENT:       Eyes: PERRL, EOMI, no scleral icterus.       ENT: No discharge from the ears and nose, no pharynx injection, no tonsillar enlargement.        Neck: No JVD, no bruit, no mass felt. Heme: No neck lymph node enlargement. Cardiac: S1/S2, RRR, No murmurs, No gallops or rubs. Respiratory: has rhonchi bilaterally. No rales, wheezing or rubs. GI: Soft, nondistended, nontender, no rebound pain, no organomegaly, BS present. GU: No hematuria Ext: No pitting leg edema bilaterally. 2+DP/PT pulse bilaterally. Musculoskeletal: No joint deformities, No joint redness or warmth, no limitation of ROM in spin. Skin: No rashes.  Neuro:  confused, knows her own name, not oriented X3, cranial nerves II-XII grossly intact, moves all extremities. Psych: Patient is not psychotic.  Labs on Admission: I have personally reviewed  following labs and imaging studies  CBC:  Recent Labs Lab 12/01/16 1946  WBC 11.2*  NEUTROABS 10.0*  HGB 10.9*  HCT 33.7*  MCV 87.8  PLT 811   Basic Metabolic Panel:  Recent Labs Lab 12/01/16 1946 12/01/16 2204  NA 135  --   K 3.2*  --   CL 104  --   CO2 21*  --   GLUCOSE 172*  --   BUN 10  --   CREATININE 0.91  --   CALCIUM 8.2*  --   MG  --  1.0*   GFR: Estimated Creatinine Clearance: 55.4 mL/min (by C-G formula based on SCr of 0.91 mg/dL). Liver Function Tests:  Recent Labs Lab 12/01/16 1946  AST 20  ALT 15  ALKPHOS 93  BILITOT 0.8  PROT 5.8*  ALBUMIN 3.0*   No results for input(s): LIPASE, AMYLASE in the last 168 hours. No results for input(s): AMMONIA in the last 168 hours. Coagulation Profile:  Recent Labs Lab 12/01/16 2204  INR 1.33   Cardiac Enzymes:  Recent Labs Lab 12/01/16 2204 12/02/16 0403  TROPONINI 0.04* 0.04*   BNP (last 3 results) No results for input(s): PROBNP in the last 8760 hours. HbA1C: No results for input(s): HGBA1C in the last 72 hours. CBG:  Recent Labs Lab 12/01/16 2324 12/02/16 0533  GLUCAP 170* 195*   Lipid Profile: No results for input(s): CHOL, HDL, LDLCALC, TRIG, CHOLHDL, LDLDIRECT in the last 72 hours. Thyroid Function Tests:  Recent Labs  12/02/16 0403 12/02/16 0436  TSH  --  0.228*  FREET4 1.09  --    Anemia Panel: No results for input(s): VITAMINB12, FOLATE, FERRITIN, TIBC, IRON, RETICCTPCT in the last 72 hours. Urine analysis:    Component Value Date/Time   COLORURINE YELLOW 12/02/2016 0239   APPEARANCEUR CLEAR 12/02/2016 0239   LABSPEC 1.016 12/02/2016 0239   PHURINE 5.0 12/02/2016 0239   GLUCOSEU 50 (A) 12/02/2016 0239   HGBUR NEGATIVE 12/02/2016 0239   BILIRUBINUR NEGATIVE 12/02/2016 0239    KETONESUR NEGATIVE 12/02/2016 0239   PROTEINUR NEGATIVE 12/02/2016 0239   UROBILINOGEN 1.0 02/03/2013 0912   NITRITE NEGATIVE 12/02/2016 0239   LEUKOCYTESUR NEGATIVE 12/02/2016 0239   Sepsis Labs: '@LABRCNTIP'$ (procalcitonin:4,lacticidven:4) ) Recent Results (from the past 240 hour(s))  C difficile quick scan w PCR reflex     Status: None   Collection Time: 12/02/16  1:17 AM  Result Value Ref Range Status   C Diff antigen NEGATIVE NEGATIVE Final   C Diff toxin NEGATIVE NEGATIVE Final   C Diff interpretation No C. difficile detected.  Final     Radiological Exams on Admission: Dg Chest Port 1 View  Result Date: 12/01/2016 CLINICAL DATA:  Patient with coarse breath sounds.  Confusion. EXAM: PORTABLE CHEST 1 VIEW COMPARISON:  Chest CT 09/22/2016; chest radiograph 10/11/2016. FINDINGS: Interval increase in right upper lobe paramediastinal consolidation. Interval development of consolidative opacities within the left mid lower lung. Stable cardiac and mediastinal contours. Probable small left pleural effusion. IMPRESSION: Interval development of consolidation within the left mid and lower lung which may represent pneumonia in the appropriate clinical setting. Interval increase in consolidation within the paramediastinal right lung. This may represent evolving postradiation change however locally recurrent disease is not excluded. After resolution of the acute symptomatology, recommend correlation with chest CT. Electronically Signed   By: Lovey Newcomer M.D.   On: 12/01/2016 21:10     EKG: Independently reviewed.  Possible atrial flutter, old right bundle block 8, tachycardia, QTc 583  Assessment/Plan Principal Problem:   Acute respiratory failure with hypoxia (HCC) Active Problems:   Uncontrolled type 2 diabetes mellitus with complication (HCC)   GERD (gastroesophageal reflux disease)   COPD exacerbation (HCC)   Sepsis ([place])   Hyperlipidemia   Colonic diverticular abscess   Hypertension  associated with chronic kidney disease due to type 2 diabetes mellitus ([place])   Cerebrovascular accident (CVA) ([place])   CAP (community acquired pneumonia)   Essential hypertension   Acute encephalopathy   Chronic combined systolic and diastolic congestive heart failure ([place])   Diarrhea   Acute respiratory failure with hypoxia due to CAP and COPD exacerbation: CXR showed infiltration in LML and LLL. Pt has diffused rhonchi indicating COPD exacerbation. PCCM was consulted  - will admit SDU as inpt. - IV Vancomycin, Zosyn and doxycycline - Mucinex for cough  - prn Albuterol Nebs, DuoNeb, brovana nebs, Pulmicort nebulizers, for SOB - Solu-Medrol 40 mg twice a day - Urine legionella and S. pneumococcal antigen - Follow up blood culture x2, sputum culture and plus Flu pcr - start Tamiflu even no flu PCR is negative per [facility] M  Sepsis: likely multifactorial etiology, including pneumonia, COPD exacerbation and diverticular abscess. Lactic acid 2.46-->1.85. - will get Procalcitonin and trend lactic acid level per sepsis protocol - IVF: 2.5L of NS bolus in ED, followed by 100 mL per hour of NS  -abx as above  Diarrhea -check c diff pcr and GI path panel -give one dose of Flagyl  DM-II: Last A1c 7.1 on 09/11/16, well controled. Patient is taking 70/30 insulin as a sliding scale at home -SSI  GERD: -Protonix  HLD: Last LDL was 69 on 09/26/16 -Continue home medications: Lipitor -Check FLP  Hx diverticular abscess (Nov 2017); s/p drain placement by IR and now being followed by surgery. -continue draining  HTN: -hold bp med due to hypotension  h x of stroke: -continue lipiotor and plavix  Chronic combined systolic and diastolic congestive heart failure: 2-D echo on 09/14/16 showed EF 30-35% with grade 1 diastolic dysfunction. Patient does not have leg edema. BNP at 8.4. CHF is compensated on admission. -Hold her Lasix due to hypotension and sepsis.-  Possible A flutter with RVR:  CHA2DS2-VASc Score is 7. This is likely triggered by sepsis, may be transient event. Heart rate is 140-->110 now -will check trop x 3 -check TSH,Free T4 and T3 -2d echo. -will start Metopolol when Bp is stabilized.  Acute encephalopathy: Likely due to sepsis and multifactorial etiologies -Frequent neuro check -Treat underlying issues as above   Sepsis - REAssessment   Performed at:    6:54 AM   Last Vitals:    Blood pressure 93/64, pulse (!) 131, temperature 98.9 F (37.2 C), temperature source Oral, resp. rate (!) 33, height '5\' 2"'$  (1.575 m), weight 66.2 kg (146 lb), SpO2 92 %.  Heart:                  Regular rhythm Tachchycarida  Lungs:     Has rhonchi bilaterally  Capillary Refill:   <2 seconds    Peripheral Pulse (include location): Brachial pulse palpable   Skin (include color):              Pale       DVT ppx: SQ Lovenox Code Status: Full code Family Communication: None at bed side.   Disposition Plan:  Anticipate discharge back to previous home environment Consults called:  None Admission status: SDU/inpation       Date  of Service 12/02/2016    Ivor Costa [facility] Pager [phone]  If 7PM-7AM, please contact night-coverage [email or web address] Password [email or web address] 12/02/2016, 6:54 AM

## 2016-12-01 NOTE — Patient Outreach (Signed)
Transition of care call. I have called Mrs. [name] two times this week and she has not been home. I did speak with her husband today and he reports she is feeling well and out and about with her sister. I have asked him to tell her I called and that I would like her to check in with me. He said he would give her the message.  [name] [facility] [phone]

## 2016-12-01 NOTE — ED Notes (Signed)
Unable to obtain urine sample d/t mixed occurrence with soft stool.

## 2016-12-01 NOTE — ED Triage Notes (Signed)
Pt BIB EMS from home with new onset extreme weakness. Pt alert to self, but confused to various other aspects of surrounding events or time. Congested cough with congested lung sounds. Received 1037m of NS from EMS. ST w/LBBB on ECG. Hx of MI 08/2016

## 2016-12-02 ENCOUNTER — Inpatient Hospital Stay (HOSPITAL_COMMUNITY): Payer: Medicare Other

## 2016-12-02 DIAGNOSIS — R197 Diarrhea, unspecified: Secondary | ICD-10-CM | POA: Diagnosis present

## 2016-12-02 LAB — STREP PNEUMONIAE URINARY ANTIGEN: Strep Pneumo Urinary Antigen: NEGATIVE

## 2016-12-02 LAB — GASTROINTESTINAL PANEL BY PCR, STOOL (REPLACES STOOL CULTURE)
ADENOVIRUS F40/41: NOT DETECTED
Astrovirus: NOT DETECTED
CAMPYLOBACTER SPECIES: NOT DETECTED
CRYPTOSPORIDIUM: NOT DETECTED
Cyclospora cayetanensis: NOT DETECTED
ENTEROPATHOGENIC E COLI (EPEC): NOT DETECTED
Entamoeba histolytica: NOT DETECTED
Enteroaggregative E coli (EAEC): NOT DETECTED
Enterotoxigenic E coli (ETEC): NOT DETECTED
Giardia lamblia: NOT DETECTED
NOROVIRUS GI/GII: NOT DETECTED
PLESIMONAS SHIGELLOIDES: NOT DETECTED
ROTAVIRUS A: NOT DETECTED
SALMONELLA SPECIES: NOT DETECTED
SHIGELLA/ENTEROINVASIVE E COLI (EIEC): NOT DETECTED
Sapovirus (I, II, IV, and V): NOT DETECTED
Shiga like toxin producing E coli (STEC): NOT DETECTED
Vibrio cholerae: NOT DETECTED
Vibrio species: NOT DETECTED
YERSINIA ENTEROCOLITICA: NOT DETECTED

## 2016-12-02 LAB — BASIC METABOLIC PANEL
Anion gap: 11 (ref 5–15)
BUN: 10 mg/dL (ref 6–20)
CHLORIDE: 106 mmol/L (ref 101–111)
CO2: 23 mmol/L (ref 22–32)
CREATININE: 0.81 mg/dL (ref 0.44–1.00)
Calcium: 7.7 mg/dL — ABNORMAL LOW (ref 8.9–10.3)
GFR calc Af Amer: >60 mL/min (ref >60)
GFR calc non Af Amer: >60 mL/min (ref >60)
Glucose, Bld: 195 mg/dL — ABNORMAL HIGH (ref 65–99)
Potassium: 3.6 mmol/L (ref 3.5–5.1)
SODIUM: 140 mmol/L (ref 135–145)

## 2016-12-02 LAB — T4, FREE: FREE T4: 1.09 ng/dL (ref 0.61–1.12)

## 2016-12-02 LAB — ECHOCARDIOGRAM COMPLETE
Height: 62 in
WEIGHTICAEL: 2574.97 [oz_av]

## 2016-12-02 LAB — LIPID PANEL
CHOL/HDL RATIO: 1.9 ratio
CHOLESTEROL: 65 mg/dL (ref 0–200)
HDL: 34 mg/dL — ABNORMAL LOW (ref >40)
LDL Cholesterol: 23 mg/dL (ref 0–99)
TRIGLYCERIDES: 42 mg/dL (ref <150)
VLDL: 8 mg/dL (ref 0–40)

## 2016-12-02 LAB — I-STAT ARTERIAL BLOOD GAS, ED
Acid-base deficit: 11 mmol/L — ABNORMAL HIGH (ref 0.0–2.0)
Bicarbonate: 14.3 mmol/L — ABNORMAL LOW (ref 20.0–28.0)
O2 SAT: 88 %
PCO2 ART: 30.7 mmHg — AB (ref 32.0–48.0)
PH ART: 7.277 — AB (ref 7.350–7.450)
PO2 ART: 61 mmHg — AB (ref 83.0–108.0)
Patient temperature: 98.9
TCO2: 15 mmol/L (ref 0–100)

## 2016-12-02 LAB — TROPONIN I
Troponin I: 0.04 ng/mL (ref <0.03)
Troponin I: 0.05 ng/mL (ref <0.03)

## 2016-12-02 LAB — URINALYSIS, ROUTINE W REFLEX MICROSCOPIC
Bilirubin Urine: NEGATIVE
Glucose, UA: 50 mg/dL — AB
Hgb urine dipstick: NEGATIVE
KETONES UR: NEGATIVE mg/dL
LEUKOCYTES UA: NEGATIVE
NITRITE: NEGATIVE
PROTEIN: NEGATIVE mg/dL
Specific Gravity, Urine: 1.016 (ref 1.005–1.030)
pH: 5 (ref 5.0–8.0)

## 2016-12-02 LAB — INFLUENZA PANEL BY PCR (TYPE A & B)
INFLBPCR: NEGATIVE
Influenza A By PCR: NEGATIVE

## 2016-12-02 LAB — TSH: TSH: 0.228 u[IU]/mL — ABNORMAL LOW (ref 0.350–4.500)

## 2016-12-02 LAB — GLUCOSE, CAPILLARY
GLUCOSE-CAPILLARY: 152 mg/dL — AB (ref 65–99)
GLUCOSE-CAPILLARY: 212 mg/dL — AB (ref 65–99)
Glucose-Capillary: 195 mg/dL — ABNORMAL HIGH (ref 65–99)
Glucose-Capillary: 181 mg/dL — ABNORMAL HIGH (ref 65–99)
Glucose-Capillary: 174 mg/dL — ABNORMAL HIGH (ref 65–99)
Glucose-Capillary: 183 mg/dL — ABNORMAL HIGH (ref 65–99)

## 2016-12-02 LAB — C DIFFICILE QUICK SCREEN W PCR REFLEX
C DIFFICILE (CDIFF) INTERP: NOT DETECTED
C DIFFICILE (CDIFF) TOXIN: NEGATIVE
C Diff antigen: NEGATIVE

## 2016-12-02 LAB — MRSA PCR SCREENING: MRSA BY PCR: NEGATIVE

## 2016-12-02 MED ORDER — ARFORMOTEROL TARTRATE 15 MCG/2ML IN NEBU
15.0000 ug | INHALATION_SOLUTION | Freq: Two times a day (BID) | RESPIRATORY_TRACT | Status: DC
  Administered 2016-12-02 – 2016-12-08 (×14): 15 ug via RESPIRATORY_TRACT
  Filled 2016-12-02 (×14): qty 2

## 2016-12-02 MED ORDER — ALBUTEROL SULFATE (2.5 MG/3ML) 0.083% IN NEBU: 2.5000 mg | INHALATION_SOLUTION | RESPIRATORY_TRACT | Status: DC | PRN

## 2016-12-02 MED ORDER — METHYLPREDNISOLONE SODIUM SUCC 40 MG IJ SOLR
40.0000 mg | Freq: Two times a day (BID) | INTRAMUSCULAR | Status: DC
  Administered 2016-12-02 – 2016-12-05 (×7): 40 mg via INTRAVENOUS
  Filled 2016-12-02 (×7): qty 1

## 2016-12-02 MED ORDER — PERFLUTREN LIPID MICROSPHERE
1.0000 mL | INTRAVENOUS | Status: AC | PRN
  Administered 2016-12-02: 2 mL via INTRAVENOUS
  Filled 2016-12-02: qty 10

## 2016-12-02 MED ORDER — SODIUM BICARBONATE 8.4 % IV SOLN
150.0000 meq | Freq: Once | INTRAVENOUS | Status: AC
Start: 2016-12-02 — End: 2016-12-02
  Administered 2016-12-02: 150 meq via INTRAVENOUS
  Filled 2016-12-02: qty 50

## 2016-12-02 MED ORDER — DOXYCYCLINE HYCLATE 100 MG IV SOLR
100.0000 mg | Freq: Two times a day (BID) | INTRAVENOUS | Status: DC
  Administered 2016-12-02: 100 mg via INTRAVENOUS
  Filled 2016-12-02 (×3): qty 100

## 2016-12-02 MED ORDER — INSULIN ASPART 100 UNIT/ML ~~LOC~~ SOLN
0.0000 [IU] | SUBCUTANEOUS | Status: DC
  Administered 2016-12-02 (×5): 3 [IU] via SUBCUTANEOUS
  Administered 2016-12-03: 8 [IU] via SUBCUTANEOUS
  Administered 2016-12-03: 3 [IU] via SUBCUTANEOUS
  Administered 2016-12-03: 2 [IU] via SUBCUTANEOUS
  Administered 2016-12-03: 5 [IU] via SUBCUTANEOUS
  Administered 2016-12-03: 3 [IU] via SUBCUTANEOUS
  Administered 2016-12-03: 5 [IU] via SUBCUTANEOUS
  Administered 2016-12-03: 3 [IU] via SUBCUTANEOUS
  Administered 2016-12-04: 2 [IU] via SUBCUTANEOUS
  Administered 2016-12-04: 3 [IU] via SUBCUTANEOUS
  Administered 2016-12-04 (×2): 5 [IU] via SUBCUTANEOUS
  Administered 2016-12-04 – 2016-12-05 (×2): 2 [IU] via SUBCUTANEOUS
  Administered 2016-12-05: 3 [IU] via SUBCUTANEOUS
  Administered 2016-12-05 – 2016-12-06 (×4): 2 [IU] via SUBCUTANEOUS
  Administered 2016-12-06: 5 [IU] via SUBCUTANEOUS
  Administered 2016-12-06: 2 [IU] via SUBCUTANEOUS
  Administered 2016-12-06: 3 [IU] via SUBCUTANEOUS
  Administered 2016-12-06: 5 [IU] via SUBCUTANEOUS
  Administered 2016-12-07: 3 [IU] via SUBCUTANEOUS
  Administered 2016-12-07: 5 [IU] via SUBCUTANEOUS
  Administered 2016-12-07 (×2): 2 [IU] via SUBCUTANEOUS
  Administered 2016-12-07: 5 [IU] via SUBCUTANEOUS
  Administered 2016-12-07 – 2016-12-08 (×2): 2 [IU] via SUBCUTANEOUS
  Administered 2016-12-08 (×2): 5 [IU] via SUBCUTANEOUS
  Administered 2016-12-08: 2 [IU] via SUBCUTANEOUS
  Administered 2016-12-09: 5 [IU] via SUBCUTANEOUS

## 2016-12-02 MED ORDER — MAGNESIUM SULFATE 2 GM/50ML IV SOLN
2.0000 g | Freq: Once | INTRAVENOUS | Status: AC
Start: 2016-12-02 — End: 2016-12-02
  Administered 2016-12-02: 2 g via INTRAVENOUS
  Filled 2016-12-02: qty 50

## 2016-12-02 MED ORDER — SODIUM CHLORIDE 0.9 % IV BOLUS (SEPSIS)
1000.0000 mL | Freq: Once | INTRAVENOUS | Status: AC
  Administered 2016-12-02: 1000 mL via INTRAVENOUS

## 2016-12-02 MED ORDER — IOPAMIDOL (ISOVUE-300) INJECTION 61%
15.0000 mL | INTRAVENOUS | Status: AC
  Administered 2016-12-02 (×2): 15 mL via ORAL

## 2016-12-02 MED ORDER — IOPAMIDOL (ISOVUE-300) INJECTION 61%
INTRAVENOUS | Status: AC
  Administered 2016-12-02: 100 mL
  Filled 2016-12-02: qty 100

## 2016-12-02 MED ORDER — ORAL CARE MOUTH RINSE
15.0000 mL | Freq: Two times a day (BID) | OROMUCOSAL | Status: DC
  Administered 2016-12-02 – 2016-12-08 (×11): 15 mL via OROMUCOSAL

## 2016-12-02 MED ORDER — PERFLUTREN LIPID MICROSPHERE
INTRAVENOUS | Status: AC
  Administered 2016-12-02: 2 mL via INTRAVENOUS
  Filled 2016-12-02: qty 10

## 2016-12-02 MED ORDER — BUDESONIDE 0.5 MG/2ML IN SUSP
0.5000 mg | Freq: Two times a day (BID) | RESPIRATORY_TRACT | Status: DC
  Administered 2016-12-02 – 2016-12-08 (×14): 0.5 mg via RESPIRATORY_TRACT
  Filled 2016-12-02 (×15): qty 2

## 2016-12-02 MED ORDER — SODIUM BICARBONATE 8.4 % IV SOLN
INTRAVENOUS | Status: AC
  Filled 2016-12-02: qty 100

## 2016-12-02 MED ORDER — METRONIDAZOLE 500 MG PO TABS
500.0000 mg | ORAL_TABLET | Freq: Once | ORAL | Status: AC
  Administered 2016-12-02: 500 mg via ORAL
  Filled 2016-12-02: qty 1

## 2016-12-02 MED ORDER — GUAIFENESIN ER 600 MG PO TB12
1200.0000 mg | ORAL_TABLET | Freq: Two times a day (BID) | ORAL | Status: DC
  Administered 2016-12-02 – 2016-12-09 (×15): 1200 mg via ORAL
  Filled 2016-12-02 (×15): qty 2

## 2016-12-02 NOTE — Care Management Note (Signed)
Case Management Note  Patient Details  Name: [name] MRN: [id number] Date of Birth: 03/31/1946  Subjective/Objective:adm w resp failure                   Action/Plan: lives at home w husband. Act w [facility] for hhrn and bath aid,. Act w thn also for community case mgt.   Expected Discharge Date:                  Expected Discharge Plan:  [place]  In-House Referral:     Discharge planning Services  CM Consult  Post Acute Care Choice:  Resumption of Svcs/PTA Provider Choice offered to:  Patient  DME Arranged:    DME Agency:     HH Arranged:  RN, Disease Management [place] Agency:  [place], Other - See comment  Status of Service:  In process, will continue to follow  If discussed at Long Length of Stay Meetings, dates discussed:    Additional Comments:alerted [facility] of adm. Lacretia Leigh, RN 12/02/2016, 9:28 AM

## 2016-12-02 NOTE — Progress Notes (Signed)
PROGRESS NOTE  [name] IPJ:[id number] DOB: [birth year] DOA: 12/01/2016 PCP: [name], MD   LOS: 1 day   Brief Narrative: 71 y.o. female with medical history significant of hypertension, hyperlipidemia, diabetes mellitus, COPD, GERD, Torsade-de pointes, NSCLC 2010 (s/p of chemo and XTR, no surgery), stroke, diverticulitis, CAD, combined systolic and diastolic CHF with EF 73-41%, CKD-II, CAD, diverticular abscess with colonic fistular (has draining tube in place), who presents with cough, chest pain, fever, altered mental status, diarrhea.  Assessment & Plan: Principal Problem:   Acute respiratory failure with hypoxia (HCC) Active Problems:   Uncontrolled type 2 diabetes mellitus with complication (HCC)   GERD (gastroesophageal reflux disease)   COPD exacerbation (HCC)   Sepsis ([place])   Hyperlipidemia   Colonic diverticular abscess   Hypertension associated with chronic kidney disease due to type 2 diabetes mellitus ([place])   Cerebrovascular accident (CVA) ([place])   CAP (community acquired pneumonia)   Essential hypertension   Acute encephalopathy   Chronic combined systolic and diastolic congestive heart failure ([place])   Diarrhea   Sepsis and Acute respiratory failure with hypoxia due to HCAP and COPD exacerbation  - CXR showed infiltration in LML and LLL. Pt has diffused rhonchi indicating COPD exacerbation. PCCM was consulted and have evaluated patient - Continue broad-spectrum antibiotics with vancomycin and Zosyn - Influenza PCR negative, Streptococcus antigen negative - Continue steroids as well, still has wheezing this morning, continue Duonebs - Remains critically ill, continue to closely monitor the step down  Recent diverticulitis with abscess - still has drain in place, followed by Dr. 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 Drain is in place. - C. difficile is negative.  Recent history of Takatsubo cardiomyopathy - This was diagnosed in November 2017, 2-D echo at that time showed an ejection fraction of 35%, she was advised by cardiology and underwent cardiac catheterization which showed mild nonobstructive coronary artery disease - Repeat 2-D echo is pending  DM-II - Last A1c 7.1 on 09/11/16, well controled. Patient is taking 70/30 insulin as a sliding scale at home - SSI while in-house  GERD - Protonix  HLD - Continue home medication  HTN - hold bp med due to hypotension  Hx of CVA - continue lipitor and plavix  Acute encephalopathy  - Likely due to sepsis and multifactorial etiologies - She is communicative this morning, suspect she is improving   DVT prophylaxis: Lovenox Code Status: Full code Family Communication: no family bedside Disposition Plan: TBD  Consultants:   PCCM  Procedures:   2D echo: pending  Antimicrobials:  Vancomycin 2/8 >>  Zosyn 2/8 >>   Subjective: - Complains of abdominal pain, denies any shortness of breath for me to know chest pain  Objective: Vitals:   12/02/16 0821 12/02/16 0900 12/02/16 0904 12/02/16 1000  BP: (!) 116/49 (!) 126/55    Pulse: (!) 111 (!) 113  (!) 115  Resp: (!) 21 (!) 33  (!) 24  Temp: 99.3 F (37.4 C)     TempSrc: Oral     SpO2: 97% 95% 97% 96%  Weight:      Height:        Intake/Output Summary (Last 24 hours) at 12/02/16 1105 Last data filed at 12/02/16 0953  Gross per 24 hour  Intake  4478.33 ml  Output              300 ml  Net          4178.33 ml   Filed Weights   12/01/16 1930 12/02/16 0500  Weight: 66.2 kg (146 lb) 73 kg (160 lb 15 oz)    Examination: Constitutional: lethargic Vitals:   12/02/16 0821 12/02/16 0900 12/02/16 0904 12/02/16 1000  BP: (!) 116/49 (!) 126/55    Pulse: (!) 111 (!) 113  (!) 115  Resp: (!) 21 (!) 33  (!) 24  Temp: 99.3 F  (37.4 C)     TempSrc: Oral     SpO2: 97% 95% 97% 96%  Weight:      Height:       Eyes: lids and conjunctivae normal ENMT: Mucous membranes are dry Neck: normal, supple, no masses, no thyromegaly Respiratory: scattered Wheezing, bilateral crackles  Cardiovascular: Regular rate and rhythm, no murmurs / rubs / gallops. tachycardic Abdomen: + tenderness lower quadrants. Bowel sounds positive. Drain in place Musculoskeletal: no clubbing / cyanosis. Neurologic: non focal, but slow to follow commands   Data Reviewed: I have personally reviewed following labs and imaging studies  CBC:  Recent Labs Lab 12/01/16 1946  WBC 11.2*  NEUTROABS 10.0*  HGB 10.9*  HCT 33.7*  MCV 87.8  PLT 035   Basic Metabolic Panel:  Recent Labs Lab 12/01/16 1946 12/01/16 2204 12/02/16 0815  NA 135  --  140  K 3.2*  --  3.6  CL 104  --  106  CO2 21*  --  23  GLUCOSE 172*  --  195*  BUN 10  --  10  CREATININE 0.91  --  0.81  CALCIUM 8.2*  --  7.7*  MG  --  1.0*  --    GFR: Estimated Creatinine Clearance: 62.2 mL/min (by C-G formula based on SCr of 0.81 mg/dL). Liver Function Tests:  Recent Labs Lab 12/01/16 1946  AST 20  ALT 15  ALKPHOS 93  BILITOT 0.8  PROT 5.8*  ALBUMIN 3.0*   No results for input(s): LIPASE, AMYLASE in the last 168 hours. No results for input(s): AMMONIA in the last 168 hours. Coagulation Profile:  Recent Labs Lab 12/01/16 2204  INR 1.33   Cardiac Enzymes:  Recent Labs Lab 12/01/16 2204 12/02/16 0403  TROPONINI 0.04* 0.04*   BNP (last 3 results) No results for input(s): PROBNP in the last 8760 hours. HbA1C: No results for input(s): HGBA1C in the last 72 hours. CBG:  Recent Labs Lab 12/01/16 2324 12/02/16 0533 12/02/16 0824  GLUCAP 170* 195* 181*   Lipid Profile:  Recent Labs  12/02/16 0403  CHOL 65  HDL 34*  LDLCALC 23  TRIG 42  CHOLHDL 1.9   Thyroid Function Tests:  Recent Labs  12/02/16 0403 12/02/16 0436  TSH  --  0.228*   FREET4 1.09  --    Anemia Panel: No results for input(s): VITAMINB12, FOLATE, FERRITIN, TIBC, IRON, RETICCTPCT in the last 72 hours. Urine analysis:    Component Value Date/Time   COLORURINE YELLOW 12/02/2016 0239   APPEARANCEUR CLEAR 12/02/2016 0239   LABSPEC 1.016 12/02/2016 0239   PHURINE 5.0 12/02/2016 0239   GLUCOSEU 50 (A) 12/02/2016 0239   HGBUR NEGATIVE 12/02/2016 0239   BILIRUBINUR NEGATIVE 12/02/2016 0239   KETONESUR NEGATIVE 12/02/2016 0239   PROTEINUR NEGATIVE 12/02/2016 0239   UROBILINOGEN 1.0 02/03/2013 0912   NITRITE NEGATIVE 12/02/2016 0239   LEUKOCYTESUR NEGATIVE 12/02/2016 0239  Sepsis Labs: Invalid input(s): PROCALCITONIN, LACTICIDVEN  Recent Results (from the past 240 hour(s))  C difficile quick scan w PCR reflex     Status: None   Collection Time: 12/02/16  1:17 AM  Result Value Ref Range Status   C Diff antigen NEGATIVE NEGATIVE Final   C Diff toxin NEGATIVE NEGATIVE Final   C Diff interpretation No C. difficile detected.  Final  MRSA PCR Screening     Status: None   Collection Time: 12/02/16  5:21 AM  Result Value Ref Range Status   MRSA by PCR NEGATIVE NEGATIVE Final    Comment:        The GeneXpert MRSA Assay (FDA approved for NASAL specimens only), is one component of a comprehensive MRSA colonization surveillance program. It is not intended to diagnose MRSA infection nor to guide or monitor treatment for MRSA infections.       Radiology Studies: Dg Chest Port 1 View  Result Date: 12/01/2016 CLINICAL DATA:  Patient with coarse breath sounds.  Confusion. EXAM: PORTABLE CHEST 1 VIEW COMPARISON:  Chest CT 09/22/2016; chest radiograph 10/11/2016. FINDINGS: Interval increase in right upper lobe paramediastinal consolidation. Interval development of consolidative opacities within the left mid lower lung. Stable cardiac and mediastinal contours. Probable small left pleural effusion. IMPRESSION: Interval development of consolidation within the  left mid and lower lung which may represent pneumonia in the appropriate clinical setting. Interval increase in consolidation within the paramediastinal right lung. This may represent evolving postradiation change however locally recurrent disease is not excluded. After resolution of the acute symptomatology, recommend correlation with chest CT. Electronically Signed   By: Lovey Newcomer M.D.   On: 12/01/2016 21:10     Scheduled Meds: . arformoterol  15 mcg Nebulization BID  . aspirin  81 mg Oral Daily  . atorvastatin  80 mg Oral Daily  . budesonide (PULMICORT) nebulizer solution  0.5 mg Nebulization BID  . clopidogrel  75 mg Oral Daily  . doxycycline (VIBRAMYCIN) IV  100 mg Intravenous Q12H  . enoxaparin (LOVENOX) injection  40 mg Subcutaneous Q24H  . guaiFENesin  1,200 mg Oral BID  . insulin aspart  0-15 Units Subcutaneous Q4H  . ipratropium-albuterol  3 mL Nebulization Q4H  . loratadine  10 mg Oral QHS  . mouth rinse  15 mL Mouth Rinse BID  . methylPREDNISolone (SOLU-MEDROL) injection  40 mg Intravenous Q12H  . pantoprazole  40 mg Oral Q breakfast  . piperacillin-tazobactam (ZOSYN)  IV  3.375 g Intravenous Q8H  . vancomycin  500 mg Intravenous Q12H   Continuous Infusions:  Marzetta Board, MD, PhD [facility] Pager [phone]  If 7PM-7AM, please contact night-coverage [email or web address] Password [email or web address] 12/02/2016, 11:05 AM

## 2016-12-02 NOTE — Consult Note (Signed)
   [facility] CM Inpatient Consult   12/02/2016  GER NICKS 1946-02-13 [id number]  Patient is active with [facility].  Patient is currently active with [facility] for chronic disease management services.  Patient has been engaged by a [employer] and CSW.  Chart review reveals the patient is a 71 y.o.femalewith medical history significant of hypertension, hyperlipidemia, diabetes mellitus, COPD, GERD, Torsade-de pointes, NSCLC 2010 (s/p of chemo and XTR, no surgery), stroke, diverticulitis, CAD, combined systolic and diastolic CHF with EF 14-64%, CKD-II, CAD, diverticular abscess with colonic fistular (has draining tube in place), who presents with cough, chest pain, fever, altered mental status, diarrhea per MD notes.    Met with the patient at the bedside.  Family member at bedside.  Patient endorses ongoing follow up with [facility].  Alert her [place], [place], [place]. Made Inpatient Case Manager aware that [facility] following. Of note, [facility] Care Management services does not replace or interfere with any services that are needed or arranged by inpatient case management or social work.  For additional questions or referrals please contact:  Natividad Brood, RN BSN [facility]iaison  [phone] business mobile phone Toll free office [phone]

## 2016-12-02 NOTE — Progress Notes (Addendum)
Pt. admitted to 4N09 from ED, report from [facility]. Oriented to room, call bell, fall safety plan. Bed in low position, yellow non-skid socks in place, bed alarm on. Full assessment to Epic; skin WDL except for blanchable redness noted to sacrum, prophylactic sacral dressing placed. Gauze dressing to abdominal drain in place upon admission. Rhonchi and coarse crackles auscultated bilaterally across lung fields, VSS on venturi mask 14L 55%. A&Ox4 but does exhibit some memory impairment, specifically pertaining to last few months with her hospital admissions. Denies any pain or discomfort at this time. Declines RN offer to call and notify family of arrival to unit. Will continue to monitor.

## 2016-12-02 NOTE — Consult Note (Signed)
PULMONARY / CRITICAL CARE MEDICINE   Name: [name] MRN: [id number] DOB: [birth year]    ADMISSION DATE:  12/01/2016 CONSULTATION DATE:  12/02/16  REFERRING MD:  Blaine Hamper  CHIEF COMPLAINT:  SOB  HISTORY OF PRESENT ILLNESS: [name] is a 71 y.o. female with PMH as outlined below including but not limited to [place] (2012, s/p chemo and XRT).  She was brought to [facility] ED 02/08 with cough, fever, AMS, diarrhea.  She was initially altered in ED so was unable to participate in interview. She was found to have multifocal PNA and hypotension with SBP in low 80's.  She received 2.5L NS and SBP improved to mid 90's and occasionally in low 100's.   After fluids, she developed worsening hypoxia so PCCM was asked to see.   During my assessment, she tells me that she has no SOB and does not feel that she needs the O2 (Currently on 55% VM and SpO2 is low to mid 90's).  She denies chest pain, chills/sweats, N/V, myalgias.  She has had mild subjective fever, cough with minimal clear sputum production, abd pain (has hx recent diverticular abscess with colonic fistula and still has drain in place, placed 09/12/16), diarrhea (at least 4 episodes day prior to admit).  She states that abd pain has not changed in character but she is very tender throughout abdomen. She saw PCP 02/06 and BP at the visit noted to be 105/63.  She had admission 09/11/16 through 09/27/16 for sepsis due to diverticulitis with abscess formation and had drain placed via IR 09/12/16 (drain still in place due to continued drainage; follows with Dr. [name]).  On 09/14/17 she went in to VT followed by TDP followed by cardiac arrest.  She had 6 minutes of ACLS before ROSC.  EF at the time ws 30-35%, G1DD, and had findings suggestive of Takotsubo CM.  She recovered and was discharged without life vest and cardiology had plans to repeat echo in 6-8 weeks.   PAST MEDICAL HISTORY :  She  has a past medical history of Age-related nuclear cataract  of both eyes (2016); Allergy; Arthritis; Bilateral diabetic retinopathy ([place]) (2015); Blood transfusion without reported diagnosis; CAD (coronary artery disease); Chronic combined systolic and diastolic congestive heart failure ([place]) (12/01/2016); Chronic renal insufficiency, stage 2 (mild); COPD (chronic obstructive pulmonary disease) (HCC); CVA (cerebral vascular accident) ([place]) (09/2016); Diabetes mellitus with complication ([place]); Diverticulitis (05/2016); GERD (gastroesophageal reflux disease); History of diverticulitis of colon; Hyperlipidemia; Hypertension; Lung cancer ([place]); Mild cognitive impairment with memory loss; Open-angle glaucoma (2016); Osteopenia (03/2016); and Torsades de pointes ([place]) (08/2016).  PAST SURGICAL HISTORY: She  has a past surgical history that includes Appendectomy; Abdominal hysterectomy (1997); Carpal tunnel release; left heart catheterization with coronary angiogram (N/A, 05/30/2012); Colonoscopy (10/24/00); Cholecystectomy (2002); Cataract extraction; Cardiac catheterization (N/A, 09/14/2016); ir generic historical (09/19/2016); transthoracic echocardiogram (09/14/2016); Cardiac catheterization (09/14/2016); ir generic historical (10/06/2016); ir generic historical (10/26/2016); ir generic historical (11/03/2016); and ir generic historical (11/24/2016).  Allergies  Allergen Reactions  . Lactose Intolerance (Gi) Diarrhea  . Ibuprofen Other (See Comments)    REACTION: Anxiousness, hyperventilates    No current facility-administered medications on file prior to encounter.    Current Outpatient Prescriptions on File Prior to Encounter  Medication Sig  . acetaminophen (TYLENOL) 500 MG tablet Take 500-1,000 mg by mouth every 6 (six) hours as needed for headache (or pain).   Marland Kitchen albuterol (PROAIR HFA) 108 (90 Base) MCG/ACT inhaler Inhale 2 puffs into the lungs every 6 (  six) hours as needed for wheezing or shortness of breath.  Marland Kitchen albuterol (PROVENTIL) (2.5 MG/3ML) 0.083% nebulizer  solution USE ONE VIAL IN NEBULIZER EVERY 4 HOURS AS NEEDED FOR WHEEZING OR SHORTNESS OF BREATH  . atorvastatin (LIPITOR) 80 MG tablet TAKE ONE TABLET BY MOUTH ONCE DAILY (Patient taking differently: TAKE ONE TABLET BY MOUTH ONCE DAILY AT BEDTIME)  . clopidogrel (PLAVIX) 75 MG tablet Take 1 tablet (75 mg total) by mouth daily.  Marland Kitchen FLOVENT HFA 220 MCG/ACT inhaler INHALE TWO PUFFS BY MOUTH TWICE DAILY  . furosemide (LASIX) 40 MG tablet Take 1 tablet (40 mg total) by mouth daily.  Marland Kitchen HUMULIN 70/30 KWIKPEN (70-30) 100 UNIT/ML PEN Per sliding scale, takes this qAM and aPM:  140-199 - 2 units, 200-250 - 4 units, 251-299 - 6 units,  300-349 - 8 units,  350 or above 10 units. Dispense syringes and needles as needed, Ok to switch to PEN if approved. Substitute to any brand approved. DX DM2, Code E11.65  . lisinopril (PRINIVIL,ZESTRIL) 5 MG tablet Take 1 tablet (5 mg total) by mouth daily.  Marland Kitchen loratadine (CLARITIN) 10 MG tablet Take 10 mg by mouth at bedtime.   . Metoprolol Tartrate 37.5 MG TABS Take 37.5 mg by mouth 2 (two) times daily.  . nitroGLYCERIN (NITROSTAT) 0.4 MG SL tablet Place 1 tablet (0.4 mg total) under the tongue every 5 (five) minutes as needed. For chest pain  . pantoprazole (PROTONIX) 40 MG tablet TAKE ONE TABLET BY MOUTH ONCE DAILY (Patient taking differently: TAKE ONE TABLET BY MOUTH ONCE DAILY AT BEDTIME)  . potassium chloride (K-DUR) 10 MEQ tablet Take 1 tablet (10 mEq total) by mouth daily.  . fluticasone (FLONASE) 50 MCG/ACT nasal spray Place 2 sprays into both nostrils at bedtime. (Patient not taking: Reported on 12/01/2016)    FAMILY HISTORY:  Her indicated that her mother is deceased. She indicated that her father is deceased. She indicated that her sister is alive. She indicated that only one of her five brothers is alive. She indicated that the status of her neg hx is unknown.    SOCIAL HISTORY: She  reports that she quit smoking about 8 years ago. Her smoking use included  Cigarettes. She has a 45.00 pack-year smoking history. She has never used smokeless tobacco. She reports that she does not drink alcohol or use drugs.  REVIEW OF SYSTEMS:   All negative; except for those that are bolded, which indicate positives.  Constitutional: weight loss, weight gain, night sweats, fevers, chills, fatigue, weakness.  HEENT: headaches, sore throat, sneezing, nasal congestion, post nasal drip, difficulty swallowing, tooth/dental problems, visual complaints, visual changes, ear aches. Neuro: difficulty with speech, weakness, numbness, ataxia. CV:  chest pain, orthopnea, PND, swelling in lower extremities, dizziness, palpitations, syncope.  Resp: cough, hemoptysis, dyspnea, wheezing. GI: heartburn, indigestion, abdominal pain, nausea, vomiting, diarrhea, constipation, change in bowel habits, loss of appetite, hematemesis, melena, hematochezia.  GU: dysuria, change in color of urine, urgency or frequency, flank pain, hematuria. MSK: joint pain or swelling, decreased range of motion. Psych: change in mood or affect, depression, anxiety, suicidal ideations, homicidal ideations. Skin: rash, itching, bruising.   SUBJECTIVE:  On 55% VM with SpO2 mid 90's.  SBP mid 90's, mentation normal.  Denies SOB.  VITAL SIGNS: BP 101/58   Pulse 119   Temp 98.9 F (37.2 C) (Oral)   Resp 26   Ht '5\' 2"'$  (1.575 m)   Wt 66.2 kg (146 lb)   SpO2 91%   BMI  26.70 kg/m   HEMODYNAMICS:    VENTILATOR SETTINGS: FiO2 (%):  [55 %] 55 %  INTAKE / OUTPUT: No intake/output data recorded.   PHYSICAL EXAMINATION: General: Middle aged female, in NAD. Neuro: A&O x 3, non-focal.  HEENT: [place]/AT. PERRL, sclerae anicteric. Cardiovascular: RRR, no M/R/G.  Lungs: Respirations even and unlabored.  Coarse bilaterally. Abdomen: BS x 4, soft, tender throughout, no rebound or guarding.  Musculoskeletal: No gross deformities, no edema.  Skin: Intact, warm, no rashes.  LABS:  BMET  Recent Labs Lab  12/01/16 1946  NA 135  K 3.2*  CL 104  CO2 21*  BUN 10  CREATININE 0.91  GLUCOSE 172*    Electrolytes  Recent Labs Lab 12/01/16 1946 12/01/16 2204  CALCIUM 8.2*  --   MG  --  1.0*    CBC  Recent Labs Lab 12/01/16 1946  WBC 11.2*  HGB 10.9*  HCT 33.7*  PLT 164    Coag's  Recent Labs Lab 12/01/16 2204  APTT 30  INR 1.33    Sepsis Markers  Recent Labs Lab 12/01/16 2001 12/01/16 2204 12/01/16 2217  LATICACIDVEN 2.46*  --  1.85  PROCALCITON  --  5.05  --     ABG  Recent Labs Lab 12/02/16 0314  PHART 7.277*  PCO2ART 30.7*  PO2ART 61.0*    Liver Enzymes  Recent Labs Lab 12/01/16 1946  AST 20  ALT 15  ALKPHOS 93  BILITOT 0.8  ALBUMIN 3.0*    Cardiac Enzymes  Recent Labs Lab 12/01/16 2204  TROPONINI 0.04*    Glucose  Recent Labs Lab 12/01/16 2324  GLUCAP 170*    Imaging Dg Chest Port 1 View  Result Date: 12/01/2016 CLINICAL DATA:  Patient with coarse breath sounds.  Confusion. EXAM: PORTABLE CHEST 1 VIEW COMPARISON:  Chest CT 09/22/2016; chest radiograph 10/11/2016. FINDINGS: Interval increase in right upper lobe paramediastinal consolidation. Interval development of consolidative opacities within the left mid lower lung. Stable cardiac and mediastinal contours. Probable small left pleural effusion. IMPRESSION: Interval development of consolidation within the left mid and lower lung which may represent pneumonia in the appropriate clinical setting. Interval increase in consolidation within the paramediastinal right lung. This may represent evolving postradiation change however locally recurrent disease is not excluded. After resolution of the acute symptomatology, recommend correlation with chest CT. Electronically Signed   By: Lovey Newcomer M.D.   On: 12/01/2016 21:10     STUDIES:  CXR 02/08 > LML and LLL infiltrate, RML infiltrate vs recurrent post radiation changes vs recurrent disease.  CULTURES: Blood 02/09 > Sputum 02/09  > Urine 02/09 > Abd drain 02/09 >  ANTIBIOTICS: Vanc 02/09 > Zosyn 02/09 >  SIGNIFICANT EVENTS: 02/09 > admit.  LINES/TUBES: None.  DISCUSSION: 71 y.o. female with multiple medical problems, admitted 02/09 with sepsis presumed due to CAP.  Has hx of diverticulitis with abscess, s/p drainage placement in Nov 2017 (that course c/b VT with TP and cardiac arrest). Admitted by [facility] and after fluids had drop in SpO2 so PCCM asked to see.  ASSESSMENT / PLAN:  CARDIOVASCULAR A:  Sepsis - presumed due to multifocal PNA.  Has hx diverticular abscess (s/p drain placement 08/2016 and being followed by surgery); therefore, can not rule out recurrent intraabdominal process.  A repeat sepsis assessment has been completed. 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 P:  Monitor hemodynamics, accept MAP 55  with normal mental status. Trend troponin, lactate. Repeat echo (was ordered by cardiology on last outpatient visit but does not appear to have been performed). Continue preadmission ASA, atorvastatin, plavix. Hold preadmission furosemide, lisinopril, metoprolol.  INFECTIOUS A:   Sepsis - presumed due to multifocal PNA.  Has hx diverticular abscess (s/p drain placement 08/2016 and being followed by surgery); therefore, can not rule out recurrent intraabdominal process.  A repeat sepsis assessment has been completed. P:   Abx as above (vanc / zosyn).  Follow cultures as above. PCT algorithm to limit abx exposure.  PULMONARY A: Acute hypoxic respiratory failure - due to multifocal PNA. Atelectasis. Hx NSLC (s/p chemo and XRT 2012), COPD. P:   Continue supplemental O2 as needed to maintain SpO2 > 92%. Pulmonary hygiene. Abx / cultures per ID section. Albuterol PRN. CXR in AM.  RENAL A:   Hypokalemia - s/p repletion. Hypomagnesemia - s/p repletion. NAGMA - presumed due to diarrhea. P:   3amps HCO3 for  now then reassess (will avoid infusion given worsening hypoxia after fluids). Repeat BMP this AM.  GASTROINTESTINAL A:   Hx diverticular abscess (Nov 2017; s/p drain placement by IR and now being followed by surgery). Hx GERD. Nutrition. P:   Day team to please consult surgery. If no improvement or abd pain persists / worsens, may need to consider repeat CT abd / pelvis. Continue preadmission PPI. NPO.  HEMATOLOGIC / ONCOLOGIC A:   Hx NSLC (s/p chemo and XRT 2012) - CXR with concern for PNA vs progressive post radiation scarring vs recurrent disease. VTE Prophylaxis. P:  SCD's / lovenox. CBC in AM. Follow CXR, may need repeat CT in a few weeks after acute issues resolved.  ENDOCRINE A:   DM. P:   SSI. Hold preadmission humulin.  NEUROLOGIC A:   No acute issues. P:   Avoid sedating meds.  Family updated: None available.  Interdisciplinary Family Meeting v Palliative Care Meeting:  Due by: 12/09/16.  CC time: 30 minutes.   Montey Hora, [place] [facility] Pager: [phone]  or [phone] 12/02/2016, 4:36 AM

## 2016-12-02 NOTE — Progress Notes (Signed)
   12/02/16 1115  Clinical Encounter Type  Visited With Patient and family together  Visit Type Initial  Referral From Patient  Consult/Referral To Chaplain  Spiritual Encounters  Spiritual Needs Literature  Stress Factors  Patient Stress Factors Health changes  Family Stress Factors Health changes  Advance Directives (For Healthcare)  Does Patient Have a Medical Advance Directive? No  Would patient like information on creating a medical advance directive? Yes (Inpatient - patient requests chaplain consult to create a medical advance directive)  Pt request for AD literature and forms.  Patient and Daughter will discuss and phone when ready to complete.  Chaplain Aleeyah Bensen A. Nonnie Done , MA-PC, BA-REL/PHIL  [phone]

## 2016-12-02 NOTE — Progress Notes (Signed)
Initial Nutrition Assessment  DOCUMENTATION CODES:   Not applicable  INTERVENTION:   -RD will follow for diet advancement and supplement as appropriate  NUTRITION DIAGNOSIS:   Inadequate oral intake related to inability to eat as evidenced by NPO status.  GOAL:   Patient will meet greater than or equal to 90% of their needs  MONITOR:   Diet advancement, Labs, Skin, I & O's, Weight trends  REASON FOR ASSESSMENT:        ASSESSMENT:   [name] is a 71 y.o. female with medical history significant of hypertension, hyperlipidemia, diabetes mellitus, COPD, GERD, Torsade-de pointes, NSCLC 2010 (s/p of chemo and XTR, no surgery), stroke, diverticulitis, CAD, combined systolic and diastolic CHF with EF 80-22%, CKD-II, CAD, diverticular abscess with colonic fistular (has draining tube in place), who presents with cough, chest pain, fever, altered mental status, diarrhea.  Pt admitted with acute respiratory failure with hypoxia due to CAP and COPD exacerbation.  Pt in with case manager at time of visit. Unable to perform nutrition-focused physical exam or obtain further nutrition hx at this time.   Reviewed wt hx, which revealed a 19% wt loss over the past 6 months. However, unsure of accuracy of weights, due to a 14# wt difference over the past 3 days. Wt loss is concerning, especially with hx of poor oral intake from previous hospitalization.   Pt is currently NPO, however, may benefit from addition of supplements once diet is advanced.   Labs reviewed: CBGS: 174-183.   Diet Order:  Diet NPO time specified Except for: Sips with Meds, Ice Chips  Skin:     Last BM:     Height:   Ht Readings from Last 1 Encounters:  12/02/16 '5\' 2"'$  (1.575 m)    Weight:   Wt Readings from Last 1 Encounters:  12/02/16 160 lb 15 oz (73 kg)    Ideal Body Weight:  50 kg  BMI:  Body mass index is 29.44 kg/m.  Estimated Nutritional Needs:   Kcal:  1700-1900  Protein:  85-100  grams  Fluid:  1.7-1.9 L  EDUCATION NEEDS:   No education needs identified at this time  Zaion Hreha A. Jimmye Norman, RD, LDN, CDE Pager: [phone] After hours Pager: [phone]

## 2016-12-02 NOTE — Progress Notes (Signed)
  Echocardiogram 2D Echocardiogram with Definity has been performed.  [name] 12/02/2016, 11:51 AM

## 2016-12-02 NOTE — Progress Notes (Signed)
ABG results given to MD Niu. Metabolic Acidosis

## 2016-12-02 NOTE — ED Notes (Signed)
Contacted Dr. Blaine Hamper regarding pt's status. SpO2 levels while on 4L via [place] have gone from the mid-90s% to mid-80s, max 91% while on albuterol neb tx. Contacted RT regarding use of a venturi mask, in place of . Per Dr. Blaine Hamper, this RN to d/c current IV fluids and monitor BP.

## 2016-12-03 ENCOUNTER — Inpatient Hospital Stay (HOSPITAL_COMMUNITY): Payer: Medicare Other

## 2016-12-03 DIAGNOSIS — J441 Chronic obstructive pulmonary disease with (acute) exacerbation: Secondary | ICD-10-CM

## 2016-12-03 DIAGNOSIS — J181 Lobar pneumonia, unspecified organism: Secondary | ICD-10-CM

## 2016-12-03 DIAGNOSIS — J9601 Acute respiratory failure with hypoxia: Secondary | ICD-10-CM

## 2016-12-03 DIAGNOSIS — R918 Other nonspecific abnormal finding of lung field: Secondary | ICD-10-CM

## 2016-12-03 LAB — GLUCOSE, CAPILLARY
GLUCOSE-CAPILLARY: 151 mg/dL — AB (ref 65–99)
GLUCOSE-CAPILLARY: 147 mg/dL — AB (ref 65–99)
Glucose-Capillary: 156 mg/dL — ABNORMAL HIGH (ref 65–99)
Glucose-Capillary: 161 mg/dL — ABNORMAL HIGH (ref 65–99)
Glucose-Capillary: 243 mg/dL — ABNORMAL HIGH (ref 65–99)
Glucose-Capillary: 266 mg/dL — ABNORMAL HIGH (ref 65–99)

## 2016-12-03 LAB — COMPREHENSIVE METABOLIC PANEL
ALBUMIN: 2.3 g/dL — AB (ref 3.5–5.0)
ALT: 12 U/L — ABNORMAL LOW (ref 14–54)
ANION GAP: 9 (ref 5–15)
AST: 16 U/L (ref 15–41)
Alkaline Phosphatase: 79 U/L (ref 38–126)
BILIRUBIN TOTAL: 0.5 mg/dL (ref 0.3–1.2)
BUN: 10 mg/dL (ref 6–20)
CHLORIDE: 103 mmol/L (ref 101–111)
CO2: 24 mmol/L (ref 22–32)
Calcium: 7.9 mg/dL — ABNORMAL LOW (ref 8.9–10.3)
Creatinine, Ser: 0.77 mg/dL (ref 0.44–1.00)
GFR calc Af Amer: >60 mL/min (ref >60)
GFR calc non Af Amer: >60 mL/min (ref >60)
GLUCOSE: 186 mg/dL — AB (ref 65–99)
POTASSIUM: 3.2 mmol/L — AB (ref 3.5–5.1)
SODIUM: 136 mmol/L (ref 135–145)
TOTAL PROTEIN: 5.3 g/dL — AB (ref 6.5–8.1)

## 2016-12-03 LAB — CBC
HEMATOCRIT: 27.9 % — AB (ref 36.0–46.0)
HEMOGLOBIN: 8.9 g/dL — AB (ref 12.0–15.0)
MCH: 27.7 pg (ref 26.0–34.0)
MCHC: 31.9 g/dL (ref 30.0–36.0)
MCV: 86.9 fL (ref 78.0–100.0)
Platelets: 154 10*3/uL (ref 150–400)
RBC: 3.21 MIL/uL — ABNORMAL LOW (ref 3.87–5.11)
RDW: 15.2 % (ref 11.5–15.5)
WBC: 10.4 10*3/uL (ref 4.0–10.5)

## 2016-12-03 LAB — T3, FREE: T3 FREE: 2 pg/mL (ref 2.0–4.4)

## 2016-12-03 LAB — HEMOGLOBIN A1C
Hgb A1c MFr Bld: 6.5 % — ABNORMAL HIGH (ref 4.8–5.6)
MEAN PLASMA GLUCOSE: 140 mg/dL

## 2016-12-03 LAB — LEGIONELLA PNEUMOPHILA SEROGP 1 UR AG: L. pneumophila Serogp 1 Ur Ag: NEGATIVE

## 2016-12-03 MED ORDER — IPRATROPIUM-ALBUTEROL 0.5-2.5 (3) MG/3ML IN SOLN
3.0000 mL | Freq: Three times a day (TID) | RESPIRATORY_TRACT | Status: DC
  Administered 2016-12-03 – 2016-12-07 (×11): 3 mL via RESPIRATORY_TRACT
  Filled 2016-12-03 (×11): qty 3

## 2016-12-03 MED ORDER — FUROSEMIDE 40 MG PO TABS
40.0000 mg | ORAL_TABLET | Freq: Every day | ORAL | Status: DC
  Administered 2016-12-03 – 2016-12-04 (×2): 40 mg via ORAL
  Filled 2016-12-03 (×2): qty 1

## 2016-12-03 MED ORDER — SODIUM CHLORIDE 0.9 % IV SOLN
30.0000 meq | Freq: Once | INTRAVENOUS | Status: AC
  Administered 2016-12-03: 30 meq via INTRAVENOUS
  Filled 2016-12-03 (×2): qty 15

## 2016-12-03 NOTE — Progress Notes (Signed)
PROGRESS NOTE  [name] OZD:[id number] DOB: 1946-04-26 DOA: 12/01/2016 PCP: [name], MD   LOS: 2 days   Brief Narrative: 71 y.o. female with medical history significant of hypertension, hyperlipidemia, diabetes mellitus, COPD, GERD, Torsade-de pointes, NSCLC 2010 (s/p of chemo and XTR, no surgery), stroke, diverticulitis, CAD, combined systolic and diastolic CHF with EF 25-95%, CKD-II, CAD, diverticular abscess with colonic fistular (has draining tube in place), who presents with cough, chest pain, fever, altered mental status, diarrhea.  Assessment & Plan: Principal Problem:   Acute respiratory failure with hypoxia (HCC) Active Problems:   Uncontrolled type 2 diabetes mellitus with complication (HCC)   GERD (gastroesophageal reflux disease)   COPD exacerbation (HCC)   Sepsis ([place])   Hyperlipidemia   Colonic diverticular abscess   Hypertension associated with chronic kidney disease due to type 2 diabetes mellitus ([place])   Cerebrovascular accident (CVA) ([place])   CAP (community acquired pneumonia)   Essential hypertension   Acute encephalopathy   Chronic combined systolic and diastolic congestive heart failure ([place])   Diarrhea   Sepsis and Acute respiratory failure with hypoxia due to HCAP and COPD exacerbation  - CXR showed infiltration in LML and LLL. Pt has diffused rhonchi indicating COPD exacerbation. PCCM was consulted and have evaluated patient, she is improving, signed off 2/10  - Continue broad-spectrum antibiotics with vancomycin and Zosyn - Influenza PCR negative, Streptococcus antigen negative - Continue steroids as well, continue Duonebs - speech to see, she is improving today   Recent diverticulitis with abscess - still has drain in place, followed by Dr. 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 Will touch base with  IR on Monday re care / ?removal - C. difficile is negative.  Recent history of Takatsubo cardiomyopathy - This was diagnosed in November 2017, 2-D echo at that time showed an ejection fraction of 35%, she was advised by cardiology and underwent cardiac catheterization which showed mild nonobstructive coronary artery disease - Repeat 2-D echo shows improving EF - resume Lasix today   DM-II - Last A1c 7.1 on 09/11/16, well controled. Patient is taking 70/30 insulin as a sliding scale at home - SSI while in-house  GERD - Protonix  HLD - Continue home medication  HTN - hold bp med due to hypotension  Hx of CVA - continue lipitor and plavix  Acute encephalopathy  - Likely due to sepsis and multifactorial etiologies - more alert today, no recollection of past 2 days  DVT prophylaxis: Lovenox Code Status: Full code Family Communication: no family bedside Disposition Plan: TBD  Consultants:   PCCM  Procedures:   2D echo: pending  Antimicrobials:  Vancomycin 2/8 >>  Zosyn 2/8 >>   Subjective: - ongoing abdominal soreness, no chest pain, breathing is better   Objective: Vitals:   12/03/16 0800 12/03/16 0833 12/03/16 0838 12/03/16 0840  BP: (!) 103/52     Pulse: 93     Resp: (!) 25     Temp: 97.8 F (36.6 C)     TempSrc: Oral     SpO2: 95% 95% 95% 93%  Weight:      Height:        Intake/Output Summary (Last 24 hours) at 12/03/16 1120 Last data filed at 12/03/16 1012  Gross per 24 hour  Intake              580 ml  Output                0 ml  Net              580 ml   Filed Weights   12/01/16 1930 12/02/16 0500  Weight: 66.2 kg (146 lb) 73 kg (160 lb 15 oz)    Examination: Constitutional: lethargic Vitals:   12/03/16 0800 12/03/16 0833 12/03/16 0838 12/03/16 0840  BP: (!) 103/52     Pulse: 93     Resp: (!) 25     Temp: 97.8 F (36.6 C)     TempSrc: Oral     SpO2: 95% 95% 95% 93%  Weight:      Height:       Eyes: lids and conjunctivae  normal ENMT: Mucous membranes are dry Respiratory: scattered Wheezing, bilateral crackles  Cardiovascular: Regular rate and rhythm, no murmurs / rubs / gallops. tachycardic Abdomen: + tenderness lower quadrants. Bowel sounds positive. Drain in place Musculoskeletal: no clubbing / cyanosis. Neurologic: non focal   Data Reviewed: I have personally reviewed following labs and imaging studies  CBC:  Recent Labs Lab 12/01/16 1946 12/03/16 0259  WBC 11.2* 10.4  NEUTROABS 10.0*  --   HGB 10.9* 8.9*  HCT 33.7* 27.9*  MCV 87.8 86.9  PLT 164 109   Basic Metabolic Panel:  Recent Labs Lab 12/01/16 1946 12/01/16 2204 12/02/16 0815 12/03/16 0259  NA 135  --  140 136  K 3.2*  --  3.6 3.2*  CL 104  --  106 103  CO2 21*  --  23 24  GLUCOSE 172*  --  195* 186*  BUN 10  --  10 10  CREATININE 0.91  --  0.81 0.77  CALCIUM 8.2*  --  7.7* 7.9*  MG  --  1.0*  --   --    GFR: Estimated Creatinine Clearance: 63 mL/min (by C-G formula based on SCr of 0.77 mg/dL). Liver Function Tests:  Recent Labs Lab 12/01/16 1946 12/03/16 0259  AST 20 16  ALT 15 12*  ALKPHOS 93 79  BILITOT 0.8 0.5  PROT 5.8* 5.3*  ALBUMIN 3.0* 2.3*   No results for input(s): LIPASE, AMYLASE in the last 168 hours. No results for input(s): AMMONIA in the last 168 hours. Coagulation Profile:  Recent Labs Lab 12/01/16 2204  INR 1.33   Cardiac Enzymes:  Recent Labs Lab 12/01/16 2204 12/02/16 0403 12/02/16 1015  TROPONINI 0.04* 0.04* 0.05*   BNP (last 3 results) No results for input(s): PROBNP in the last 8760 hours. HbA1C:  Recent Labs  12/02/16 0436  HGBA1C 6.5*   CBG:  Recent Labs Lab 12/02/16 1631 12/02/16 2008 12/02/16 2354 12/03/16 0355 12/03/16 0822  GLUCAP 183* 152* 212* 161* 151*   Lipid Profile:  Recent Labs  12/02/16 0403  CHOL 65  HDL 34*  LDLCALC 23  TRIG 42  CHOLHDL 1.9   Thyroid Function Tests:  Recent Labs  12/01/16 2204 12/02/16 0403 12/02/16 0436  TSH   --   --  0.228*  FREET4  --  1.09  --   T3FREE 2.0  --   --    Anemia Panel: No results for input(s): VITAMINB12, FOLATE, FERRITIN, TIBC, IRON, RETICCTPCT in the last 72 hours. Urine analysis:    Component Value Date/Time   COLORURINE YELLOW 12/02/2016 0239   APPEARANCEUR CLEAR 12/02/2016 0239   LABSPEC 1.016 12/02/2016 0239   PHURINE 5.0 12/02/2016 0239   GLUCOSEU 50 (A) 12/02/2016 0239  HGBUR NEGATIVE 12/02/2016 0239   BILIRUBINUR NEGATIVE 12/02/2016 0239   KETONESUR NEGATIVE 12/02/2016 0239   PROTEINUR NEGATIVE 12/02/2016 0239   UROBILINOGEN 1.0 02/03/2013 0912   NITRITE NEGATIVE 12/02/2016 0239   LEUKOCYTESUR NEGATIVE 12/02/2016 0239   Sepsis Labs: Invalid input(s): PROCALCITONIN, LACTICIDVEN  Recent Results (from the past 240 hour(s))  Blood Culture (routine x 2)     Status: None (Preliminary result)   Collection Time: 12/01/16  7:45 PM  Result Value Ref Range Status   Specimen Description BLOOD LEFT ANTECUBITAL  Final   Special Requests IN PEDIATRIC BOTTLE 3CC  Final   Culture NO GROWTH < 24 HOURS  Final   Report Status PENDING  Incomplete  Blood Culture (routine x 2)     Status: None (Preliminary result)   Collection Time: 12/01/16  9:15 PM  Result Value Ref Range Status   Specimen Description BLOOD RIGHT HAND  Final   Special Requests IN PEDIATRIC BOTTLE 3CC  Final   Culture NO GROWTH < 24 HOURS  Final   Report Status PENDING  Incomplete  C difficile quick scan w PCR reflex     Status: None   Collection Time: 12/02/16  1:17 AM  Result Value Ref Range Status   C Diff antigen NEGATIVE NEGATIVE Final   C Diff toxin NEGATIVE NEGATIVE Final   C Diff interpretation No C. difficile detected.  Final  Gastrointestinal Panel by PCR , Stool     Status: None   Collection Time: 12/02/16  1:17 AM  Result Value Ref Range Status   Campylobacter species NOT DETECTED NOT DETECTED Final   Plesimonas shigelloides NOT DETECTED NOT DETECTED Final   Salmonella species NOT  DETECTED NOT DETECTED Final   Yersinia enterocolitica NOT DETECTED NOT DETECTED Final   Vibrio species NOT DETECTED NOT DETECTED Final   Vibrio cholerae NOT DETECTED NOT DETECTED Final   Enteroaggregative E coli (EAEC) NOT DETECTED NOT DETECTED Final   Enteropathogenic E coli (EPEC) NOT DETECTED NOT DETECTED Final   Enterotoxigenic E coli (ETEC) NOT DETECTED NOT DETECTED Final   Shiga like toxin producing E coli (STEC) NOT DETECTED NOT DETECTED Final   Shigella/Enteroinvasive E coli (EIEC) NOT DETECTED NOT DETECTED Final   Cryptosporidium NOT DETECTED NOT DETECTED Final   Cyclospora cayetanensis NOT DETECTED NOT DETECTED Final   Entamoeba histolytica NOT DETECTED NOT DETECTED Final   Giardia lamblia NOT DETECTED NOT DETECTED Final   Adenovirus F40/41 NOT DETECTED NOT DETECTED Final   Astrovirus NOT DETECTED NOT DETECTED Final   Norovirus GI/GII NOT DETECTED NOT DETECTED Final   Rotavirus A NOT DETECTED NOT DETECTED Final   Sapovirus (I, II, IV, and V) NOT DETECTED NOT DETECTED Final  MRSA PCR Screening     Status: None   Collection Time: 12/02/16  5:21 AM  Result Value Ref Range Status   MRSA by PCR NEGATIVE NEGATIVE Final    Comment:        The GeneXpert MRSA Assay (FDA approved for NASAL specimens only), is one component of a comprehensive MRSA colonization surveillance program. It is not intended to diagnose MRSA infection nor to guide or monitor treatment for MRSA infections.   Aerobic/Anaerobic Culture (surgical/deep wound)     Status: None (Preliminary result)   Collection Time: 12/02/16 12:35 PM  Result Value Ref Range Status   Specimen Description ABDOMEN  Final   Special Requests NONE  Final   Gram Stain   Final    RARE WBC PRESENT, PREDOMINANTLY PMN ABUNDANT GRAM  POSITIVE COCCI IN PAIRS IN CHAINS ABUNDANT GRAM NEGATIVE RODS    Culture PENDING  Incomplete   Report Status PENDING  Incomplete      Radiology Studies: Ct Abdomen Pelvis W Contrast  Result Date:  12/02/2016 CLINICAL DATA:  History of hypertension and diverticulitis with colonic fistula EXAM: CT ABDOMEN AND PELVIS WITH CONTRAST TECHNIQUE: Multidetector CT imaging of the abdomen and pelvis was performed using the standard protocol following bolus administration of intravenous contrast. CONTRAST:  165m ISOVUE-300 IOPAMIDOL (ISOVUE-300) INJECTION 61% COMPARISON:  10/06/2016 FINDINGS: Lower chest: New bilateral small pleural effusions are noted as well as bilateral pneumonia. Mild pericardial effusion is noted. Hepatobiliary: No focal liver abnormality is seen. Status post cholecystectomy. No biliary dilatation. Pancreas: Unremarkable. No pancreatic ductal dilatation or surrounding inflammatory changes. Spleen: Normal in size without focal abnormality. Adrenals/Urinary Tract: Adrenal glands are unremarkable. Kidneys are normal, without renal calculi, focal lesion, or hydronephrosis. Bladder is unremarkable. Stomach/Bowel: Changes of diverticulosis are identified. Drainage catheter is noted adjacent to the sigmoid colon consistent with history of prior abscess. No residual cavity is seen. The appendix is within normal limits. No obstructive changes are noted. Vascular/Lymphatic: Aortic atherosclerosis. No enlarged abdominal or pelvic lymph nodes. Reproductive: Status post hysterectomy. No adnexal masses. Other: No abdominal wall hernia or abnormality. No abdominopelvic ascites. Musculoskeletal: No acute or significant osseous findings. IMPRESSION: Bilateral pleural effusions and bibasilar pneumonia which are new from the prior exam. Diverticulosis without diverticulitis. A drainage catheter is noted in place without significant residual abscess cavity. Small pericardial effusion. Electronically Signed   By: MInez CatalinaM.D.   On: 12/02/2016 15:23   Dg Chest Port 1 View  Result Date: 12/01/2016 CLINICAL DATA:  Patient with coarse breath sounds.  Confusion. EXAM: PORTABLE CHEST 1 VIEW COMPARISON:  Chest CT  09/22/2016; chest radiograph 10/11/2016. FINDINGS: Interval increase in right upper lobe paramediastinal consolidation. Interval development of consolidative opacities within the left mid lower lung. Stable cardiac and mediastinal contours. Probable small left pleural effusion. IMPRESSION: Interval development of consolidation within the left mid and lower lung which may represent pneumonia in the appropriate clinical setting. Interval increase in consolidation within the paramediastinal right lung. This may represent evolving postradiation change however locally recurrent disease is not excluded. After resolution of the acute symptomatology, recommend correlation with chest CT. Electronically Signed   By: DLovey NewcomerM.D.   On: 12/01/2016 21:10     Scheduled Meds: . arformoterol  15 mcg Nebulization BID  . aspirin  81 mg Oral Daily  . atorvastatin  80 mg Oral Daily  . budesonide (PULMICORT) nebulizer solution  0.5 mg Nebulization BID  . clopidogrel  75 mg Oral Daily  . enoxaparin (LOVENOX) injection  40 mg Subcutaneous Q24H  . furosemide  40 mg Oral Daily  . guaiFENesin  1,200 mg Oral BID  . insulin aspart  0-15 Units Subcutaneous Q4H  . ipratropium-albuterol  3 mL Nebulization Q4H  . loratadine  10 mg Oral QHS  . mouth rinse  15 mL Mouth Rinse BID  . methylPREDNISolone (SOLU-MEDROL) injection  40 mg Intravenous Q12H  . pantoprazole  40 mg Oral Q breakfast  . piperacillin-tazobactam (ZOSYN)  IV  3.375 g Intravenous Q8H  . potassium chloride (KCL MULTIRUN) 30 mEq in 265 mL IVPB  30 mEq Intravenous Once  . vancomycin  500 mg Intravenous Q12H   Continuous Infusions:  CMarzetta Board MD, PhD [facility] Pager [phone] If 7PM-7AM, please contact night-coverage [email or web address] Password [email or web address] 12/03/2016, 11:20 AM

## 2016-12-03 NOTE — Progress Notes (Signed)
PULMONARY / CRITICAL CARE MEDICINE   Name: [name] MRN: [id number] DOB: 1946-03-18    ADMISSION DATE:  12/01/2016 CONSULTATION DATE:  12/02/16  REFERRING MD:  Blaine Hamper  CHIEF COMPLAINT:  SOB  HISTORY OF PRESENT ILLNESS: [name] is a 71 y.o. female with PMH as outlined below including but not limited to [place] (2012, s/p chemo and XRT).  She was brought to [facility] ED 02/08 with cough, fever, AMS, diarrhea.  She was initially altered in ED so was unable to participate in interview. She was found to have multifocal PNA and hypotension with SBP in low 80's.  She received 2.5L NS and SBP improved to mid 90's and occasionally in low 100's.   After fluids, she developed worsening hypoxia so PCCM was asked to see.  She had admission 09/11/16 through 09/27/16 for sepsis due to diverticulitis with abscess formation and had drain placed via IR 09/12/16 (drain still in place due to continued drainage; follows with Dr. [name]).  On 09/14/17 she went in to [place] followed by TDP followed by cardiac arrest.  She had 6 minutes of ACLS before ROSC.  EF at the time ws 30-35%, G1DD, and had findings suggestive of Takotsubo CM.  She recovered and was discharged without life vest and cardiology had plans to repeat echo in 6-8 weeks.  SUBJECTIVE:  On 6L [place], resting comfortably, no events overnight  VITAL SIGNS: BP 112/66 (BP Location: Right Arm)   Pulse (!) 103   Temp 97.8 F (36.6 C) (Oral)   Resp (!) 24   Ht '5\' 2"'$  (1.575 m)   Wt 73 kg (160 lb 15 oz)   SpO2 93%   BMI 29.44 kg/m   HEMODYNAMICS:    VENTILATOR SETTINGS:    INTAKE / OUTPUT: I/O last 3 completed shifts: In: 4593.3 [I.V.:328.3; Other:15; IV Piggyback:4250] Out: 300 [Urine:300]   PHYSICAL EXAMINATION: General: Chronically ill appearing female with NAD Neuro: Alert and oriented, moving all ext to command HEENT: [place]/AT, PERRL, EOM-I and MMM Cardiovascular: RRR, Nl S1/S2, -M/R/G. Lungs: Bibasilar crackles Abdomen: Soft, NT, ND and  +BS Musculoskeletal: -edema and -tenderness Skin: Intact, warm, no rashes.  LABS:  BMET  Recent Labs Lab 12/01/16 1946 12/02/16 0815 12/03/16 0259  NA 135 140 136  K 3.2* 3.6 3.2*  CL 104 106 103  CO2 21* 23 24  BUN '10 10 10  '$ CREATININE 0.91 0.81 0.77  GLUCOSE 172* 195* 186*    Electrolytes  Recent Labs Lab 12/01/16 1946 12/01/16 2204 12/02/16 0815 12/03/16 0259  CALCIUM 8.2*  --  7.7* 7.9*  MG  --  1.0*  --   --     CBC  Recent Labs Lab 12/01/16 1946 12/03/16 0259  WBC 11.2* 10.4  HGB 10.9* 8.9*  HCT 33.7* 27.9*  PLT 164 154    Coag's  Recent Labs Lab 12/01/16 2204  APTT 30  INR 1.33    Sepsis Markers  Recent Labs Lab 12/01/16 2001 12/01/16 2204 12/01/16 2217  LATICACIDVEN 2.46*  --  1.85  PROCALCITON  --  5.05  --     ABG  Recent Labs Lab 12/02/16 0314  PHART 7.277*  PCO2ART 30.7*  PO2ART 61.0*    Liver Enzymes  Recent Labs Lab 12/01/16 1946 12/03/16 0259  AST 20 16  ALT 15 12*  ALKPHOS 93 79  BILITOT 0.8 0.5  ALBUMIN 3.0* 2.3*    Cardiac Enzymes  Recent Labs Lab 12/01/16 2204 12/02/16 0403 12/02/16 1015  TROPONINI 0.04* 0.04*  0.05*    Glucose  Recent Labs Lab 12/02/16 1136 12/02/16 1631 12/02/16 2008 12/02/16 2354 12/03/16 0355 12/03/16 0822  GLUCAP 174* 183* 152* 212* 161* 151*    Imaging Ct Abdomen Pelvis W Contrast  Result Date: 12/02/2016 CLINICAL DATA:  History of hypertension and diverticulitis with colonic fistula EXAM: CT ABDOMEN AND PELVIS WITH CONTRAST TECHNIQUE: Multidetector CT imaging of the abdomen and pelvis was performed using the standard protocol following bolus administration of intravenous contrast. CONTRAST:  17m ISOVUE-300 IOPAMIDOL (ISOVUE-300) INJECTION 61% COMPARISON:  10/06/2016 FINDINGS: Lower chest: New bilateral small pleural effusions are noted as well as bilateral pneumonia. Mild pericardial effusion is noted. Hepatobiliary: No focal liver abnormality is seen. Status  post cholecystectomy. No biliary dilatation. Pancreas: Unremarkable. No pancreatic ductal dilatation or surrounding inflammatory changes. Spleen: Normal in size without focal abnormality. Adrenals/Urinary Tract: Adrenal glands are unremarkable. Kidneys are normal, without renal calculi, focal lesion, or hydronephrosis. Bladder is unremarkable. Stomach/Bowel: Changes of diverticulosis are identified. Drainage catheter is noted adjacent to the sigmoid colon consistent with history of prior abscess. No residual cavity is seen. The appendix is within normal limits. No obstructive changes are noted. Vascular/Lymphatic: Aortic atherosclerosis. No enlarged abdominal or pelvic lymph nodes. Reproductive: Status post hysterectomy. No adnexal masses. Other: No abdominal wall hernia or abnormality. No abdominopelvic ascites. Musculoskeletal: No acute or significant osseous findings. IMPRESSION: Bilateral pleural effusions and bibasilar pneumonia which are new from the prior exam. Diverticulosis without diverticulitis. A drainage catheter is noted in place without significant residual abscess cavity. Small pericardial effusion. Electronically Signed   By: MInez CatalinaM.D.   On: 12/02/2016 15:23     STUDIES:  CXR 02/08 > LML and LLL infiltrate, RML infiltrate vs recurrent post radiation changes vs recurrent disease.  CULTURES: Blood 02/09 > Sputum 02/09 > Urine 02/09 > Abd drain 02/09 >  ANTIBIOTICS: Vanc 02/09 > Zosyn 02/09 >  SIGNIFICANT EVENTS: 02/09 > admit.  LINES/TUBES: None.  I reviewed CXR myself, LLL infiltrate noted with RUL radiation burns vs evolving mass  DISCUSSION: 71y.o. female with multiple medical problems, admitted 02/09 with sepsis presumed due to CAP.  Has hx of diverticulitis with abscess, s/p drainage placement in Nov 2017 (that course c/b VT with TP and cardiac arrest). [facility] so PCCM asked to see.  ASSESSMENT /  PLAN:  CARDIOVASCULAR A:  Sepsis - presumed due to multifocal PNA.  Has hx diverticular abscess (s/p drain placement 08/2016 and being followed by surgery); therefore, can not rule out recurrent intraabdominal process.  A repeat sepsis assessment has been completed. 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 P:  MAP of 55 ok. Repeat echo (was ordered by cardiology on last outpatient visit but does not appear to have been performed). Continue preadmission ASA, atorvastatin, plavix. Hold lasix, lisinopril and lopressor for now  INFECTIOUS A:   Sepsis - presumed due to multifocal PNA.  Has hx diverticular abscess (s/p drain placement 08/2016 and being followed by surgery); therefore, can not rule out recurrent intraabdominal process.  A repeat sepsis assessment has been completed. P:   Continue vanc/zosyn F/U on cultures PCT 5.05, continue abx  PULMONARY A: Acute hypoxic respiratory failure - due to multifocal PNA. Atelectasis. Hx NSLC (s/p chemo and XRT 2012), COPD. P:   Continue supplemental O2 as needed to maintain SpO2 > 92%, down to 6L  [place] from [facility] Pulmonary hygienes PRN albuterol Will need a chest CT once O2 demand is lower Will need pulmonary F/U as outpatient to evaluate lung mass  RENAL A:   Hypokalemia - s/p repletion. Hypomagnesemia - s/p repletion. NAGMA - presumed due to diarrhea. P:   Repeat BMP this AM. Replace electrolytes as intubated  GASTROINTESTINAL A:   Hx diverticular abscess (Nov 2017; s/p drain placement by IR and now being followed by surgery). Hx GERD. Nutrition. P:   Defer to primary Continue preadmission PPI. NPO.  HEMATOLOGIC / ONCOLOGIC A:   Hx NSLC (s/p chemo and XRT 2012) - CXR with concern for PNA vs progressive post radiation scarring vs recurrent disease. VTE Prophylaxis. P:  SCD's / lovenox. CBC in AM. Follow CXR, may need repeat CT in a few weeks  after acute issues resolved.  ENDOCRINE A:   DM. P:   SSI. Hold preadmission humulin.  NEUROLOGIC A:   No acute issues. P:   Avoid sedating meds.  Family updated: None available.  Interdisciplinary Family Meeting v Palliative Care Meeting:  Due by: 12/09/16.  Discussed with PCCM-NP.  PCCM will sign off, please call back if needed.  Rush Farmer, M.D. [facility] Pulmonary/Critical Care Medicine. Pager: [phone]. After hours pager: [phone].

## 2016-12-03 NOTE — Evaluation (Signed)
Clinical/Bedside Swallow Evaluation Patient Details  Name: [name] MRN: [id number] Date of Birth: 02-26-1946  Today's Date: 12/03/2016 Time: SLP Start Time (ACUTE ONLY): 0630 SLP Stop Time (ACUTE ONLY): 1738 SLP Time Calculation (min) (ACUTE ONLY): 18 min  Past Medical History:  Past Medical History:  Diagnosis Date  . Age-related nuclear cataract of both eyes 2016   +cortical age related cataracts OU   . Allergy    seasonal  . Arthritis    hands  . Bilateral diabetic retinopathy (HCC) 2015   Dr. Zigmund Daniel  . Blood transfusion without reported diagnosis    when taking chemo  . CAD (coronary artery disease)    NON obstructive. Cath 2006 preserved LV fxn, scattered irregularities without critical stenosis. 2008 stress echo negative for ischemia, but with hypertensive response  . Chronic combined systolic and diastolic congestive heart failure ([place]) 12/01/2016  . Chronic renal insufficiency, stage 2 (mild)    GFR 60s  . COPD (chronic obstructive pulmonary disease) ([place])   . CVA (cerebral vascular accident) ([place]) 09/2016   MRI did show a left-sided small ischemic stroke which was acute but likely incidental (MRI was done b/c pt had TIA sx's in hospital).  Neuro put her on plavix at that time.  . Diabetes mellitus with complication (HCC)    diab retinopathy OU (laser)  . Diverticulitis 05/2016  . GERD (gastroesophageal reflux disease)    protonix  . History of diverticulitis of colon    with abscess; required IR percutaneous drain placed 09/2016.  Marland Kitchen Hyperlipidemia   . Hypertension   . Lung cancer ([place])    non-small cell lung ca, stage III in 05/2011; systemic chemotherapy concurrent with radiation followed by prophylactic cranial irradiation and has been observation since July of 2010 with no evidence for disease recurrence-released from onc f/u 12/2014 (needs annual cxr by PCP).  CXR 08/2015 stable.  . Mild cognitive impairment with memory loss    Likely from brain radiation  therapy  . Open-angle glaucoma 2016   Dr. Shirley Muscat, (bilateral)---responding to topical therapy  . Osteopenia 03/2016   03/2016 DEXA T-score -2.2  . Torsades de pointes ([place]) 08/2016   avoid meds with potential for QT prolongation   Past Surgical History:  Past Surgical History:  Procedure Laterality Date  . ABDOMINAL HYSTERECTOMY  1997  . APPENDECTOMY    . CARDIAC CATHETERIZATION N/A 09/14/2016   Procedure: Left Heart Cath and Coronary Angiography;  Surgeon: Troy Sine, MD;  Location: [place] CV LAB;  Service: Cardiovascular;  Laterality: N/A;  . CARDIAC CATHETERIZATION  09/14/2016   EF 25%, akinesis in a pattern of Takutsubos CM.  Marland Kitchen CARPAL TUNNEL RELEASE    . CATARACT EXTRACTION    . CHOLECYSTECTOMY  2002  . COLONOSCOPY  10/24/00   normal.  [employer] hemoccult testing via [place] 06/18/15 was NEG  . IR GENERIC HISTORICAL  09/19/2016   IR SINUS/FIST TUBE CHK-NON GI 09/19/2016 Corrie Mckusick, DO [facility]INTER[facility]  . IR GENERIC HISTORICAL  10/06/2016   IR RADIOLOGIST EVAL & MGMT 10/06/2016 [facility] INTER[facility] RAD  . IR GENERIC HISTORICAL  10/26/2016   IR RADIOLOGIST EVAL & MGMT 10/26/2016 [facility] INTER[facility]  . IR GENERIC HISTORICAL  11/03/2016   IR RADIOLOGIST EVAL & MGMT 11/03/2016 Ardis Rowan, PA-C [facility] INTERV RAD  . IR GENERIC HISTORICAL  11/24/2016   IR RADIOLOGIST EVAL & MGMT 11/24/2016 Ardis Rowan, PA-C [facility] INTER[facility] RAD  . LEFT HEART CATHETERIZATION WITH CORONARY ANGIOGRAM N/A 05/30/2012  Procedure: LEFT HEART CATHETERIZATION WITH CORONARY ANGIOGRAM;  Surgeon: Burnell Blanks, MD;  Location: [facility] CATH LAB;  Service: Cardiovascular;  Laterality: N/A;  . TRANSTHORACIC ECHOCARDIOGRAM  09/14/2016   EF 30-35 %, Akinesis of the mid-apicalanteroseptal myocardium.  Grade I DD.  Mild pulm HTN.  Repeat echo ordered by cardiologist as of 11/23/16.   HPI:  [name] a 71 y.o.femalewith PMH including NSCLC (2012, s/p chemo and XRT), CVA, COPD, GERD, mild cognitive impairment  with memory loss.She was brought to [facility] ED 02/08 with cough, fever, AMS, diarrhea. She was found to have multifocal PNA and hypotension with SBP in low 80's. She received 2.5L NS and SBP improved to mid 90's and occasionally in low 100's. She had admission 09/11/16 through 09/27/16 for sepsis due to diverticulitis with abscess formation and had drain placed via IR 09/12/16. Pt seen by SLP during prior admission s/p small left parietal CVA 08/2016. Was placed on dysphagia 3, nectar-thick liquids following coughing, changes in vital signs with thin liquids, was upgraded to dysphagia 3 with thin liquids prior to discharge, with follow-up in SNF recommended for swallowing. Notes indicated pt refused nectar-thick liquids. Chest x-ray 12/03/16 showed Airspace disease in the left lower lobe could reflect pneumonia. Seen today for swallowing evaluation.    Assessment / Plan / Recommendation Clinical Impression  Patient presents with moderate risk for aspiration given pneumonia, current respiratory status, and history of CVA. Airway protection appears clinically adequate for thin liquids, purees, and soft solids. Patient with baseline cough and fluctuating respiratory rate, which became elevated during mastication of regular solids. Patient noted with immediate cough, suggestive of reduced airway protection. Provided education to patient regarding swallow physiology and impact of respiratory rate with regard to coordination of safe swallow. Recommend initiating dys 2 diet with thin liquids, given ill-fitting dentition and need for increased time for mastication. SLP will follow for tolerance and advancement as appropriate.    Aspiration Risk  Moderate aspiration risk    Diet Recommendation Dysphagia 2 (Fine chop);Thin liquid   Liquid Administration via: Cup;Straw Medication Administration: Whole meds with liquid Supervision: Patient able to self feed;Intermittent supervision to cue for compensatory  strategies Compensations: Slow rate;Small sips/bites Postural Changes: Seated upright at 90 degrees    Other  Recommendations Oral Care Recommendations: Oral care BID   Follow up Recommendations        Frequency and Duration min 2x/week  2 weeks       Prognosis Prognosis for Safe Diet Advancement: Good      Swallow Study   General Date of Onset: 12/01/16 HPI: VICKI CHAFFIN a 71 y.o.femalewith PMH including NSCLC (2012, s/p chemo and XRT), CVA, COPD, GERD, mild cognitive impairment with memory loss.She was brought to [facility] ED 02/08 with cough, fever, AMS, diarrhea. She was found to have multifocal PNA and hypotension with SBP in low 80's. She received 2.5L NS and SBP improved to mid 90's and occasionally in low 100's. She had admission 09/11/16 through 09/27/16 for sepsis due to diverticulitis with abscess formation and had drain placed via IR 09/12/16. Pt seen by SLP during prior admission s/p small left parietal CVA 08/2016. Was placed on dysphagia 3, nectar-thick liquids following coughing, changes in vital signs with thin liquids, was upgraded to dysphagia 3 with thin liquids prior to discharge, with follow-up in SNF recommended for swallowing. Notes indicated pt refused nectar-thick liquids. Chest x-ray 12/03/16 showed Airspace disease in the left lower lobe could reflect pneumonia. Seen today for swallowing evaluation.  Type of Study: Bedside Swallow Evaluation Previous Swallow Assessment: see HPI Diet Prior to this Study: Dysphagia 2 (chopped);Thin liquids Temperature Spikes Noted: No Respiratory Status: Nasal cannula History of Recent Intubation: No Behavior/Cognition: Alert;Cooperative Oral Cavity Assessment: Within Functional Limits Oral Care Completed by SLP: Yes Oral Cavity - Dentition: Dentures, top;Dentures, bottom Vision: Functional for self-feeding Self-Feeding Abilities: Able to feed self Patient Positioning: Upright in chair Baseline Vocal Quality:  Normal Volitional Cough: Strong Volitional Swallow: Able to elicit    Oral/Motor/Sensory Function Overall Oral Motor/Sensory Function: Within functional limits   Ice Chips Ice chips: Within functional limits Presentation: Spoon   Thin Liquid Thin Liquid: Within functional limits Presentation: Cup;Straw;Self Fed;Spoon    Nectar Thick Nectar Thick Liquid: Not tested   Honey Thick Honey Thick Liquid: Not tested   Puree Puree: Within functional limits Presentation: Self Fed   Solid   GO  Deneise Lever, [place] CF-SLP Speech-Language Pathologist [phone] Solid: Impaired Presentation: Self Fed Oral Phase Impairments: Impaired mastication Pharyngeal Phase Impairments: Cough - Immediate        Aliene Altes 12/03/2016,5:50 PM

## 2016-12-04 LAB — BASIC METABOLIC PANEL
ANION GAP: 8 (ref 5–15)
BUN: 13 mg/dL (ref 6–20)
CHLORIDE: 108 mmol/L (ref 101–111)
CO2: 22 mmol/L (ref 22–32)
CREATININE: 0.8 mg/dL (ref 0.44–1.00)
Calcium: 8.3 mg/dL — ABNORMAL LOW (ref 8.9–10.3)
GFR calc non Af Amer: >60 mL/min (ref >60)
Glucose, Bld: 160 mg/dL — ABNORMAL HIGH (ref 65–99)
POTASSIUM: 3 mmol/L — AB (ref 3.5–5.1)
SODIUM: 138 mmol/L (ref 135–145)

## 2016-12-04 LAB — AEROBIC/ANAEROBIC CULTURE (SURGICAL/DEEP WOUND)

## 2016-12-04 LAB — AEROBIC/ANAEROBIC CULTURE W GRAM STAIN (SURGICAL/DEEP WOUND)

## 2016-12-04 LAB — CBC
HCT: 27 % — ABNORMAL LOW (ref 36.0–46.0)
Hemoglobin: 8.9 g/dL — ABNORMAL LOW (ref 12.0–15.0)
MCH: 28.5 pg (ref 26.0–34.0)
MCHC: 33 g/dL (ref 30.0–36.0)
MCV: 86.5 fL (ref 78.0–100.0)
PLATELETS: 186 10*3/uL (ref 150–400)
RBC: 3.12 MIL/uL — ABNORMAL LOW (ref 3.87–5.11)
RDW: 15.2 % (ref 11.5–15.5)
WBC: 11.4 10*3/uL — ABNORMAL HIGH (ref 4.0–10.5)

## 2016-12-04 LAB — GLUCOSE, CAPILLARY
Glucose-Capillary: 153 mg/dL — ABNORMAL HIGH (ref 65–99)
Glucose-Capillary: 130 mg/dL — ABNORMAL HIGH (ref 65–99)
Glucose-Capillary: 140 mg/dL — ABNORMAL HIGH (ref 65–99)
Glucose-Capillary: 214 mg/dL — ABNORMAL HIGH (ref 65–99)
Glucose-Capillary: 229 mg/dL — ABNORMAL HIGH (ref 65–99)

## 2016-12-04 LAB — PROCALCITONIN: PROCALCITONIN: 7.37 ng/mL

## 2016-12-04 MED ORDER — POTASSIUM CHLORIDE CRYS ER 20 MEQ PO TBCR
40.0000 meq | EXTENDED_RELEASE_TABLET | ORAL | Status: AC
  Administered 2016-12-04 (×2): 40 meq via ORAL
  Filled 2016-12-04 (×2): qty 2

## 2016-12-04 NOTE — Progress Notes (Signed)
PROGRESS NOTE  SILVERIA BOTZ OEV:[id number] DOB: [birth year] DOA: 12/01/2016 PCP: [name], MD   LOS: 3 days   Brief Narrative: 71 y.o. female with medical history significant of hypertension, hyperlipidemia, diabetes mellitus, COPD, GERD, Torsade-de pointes, NSCLC 2010 (s/p of chemo and XTR, no surgery), stroke, diverticulitis, CAD, combined systolic and diastolic CHF with EF 82-99%, CKD-II, CAD, diverticular abscess with colonic fistular (has draining tube in place), who presents with cough, chest pain, fever, altered mental status, diarrhea.  Assessment & Plan: Principal Problem:   Acute respiratory failure with hypoxia (HCC) Active Problems:   Uncontrolled type 2 diabetes mellitus with complication (HCC)   GERD (gastroesophageal reflux disease)   COPD exacerbation (HCC)   Sepsis ([place])   Hyperlipidemia   Colonic diverticular abscess   Hypertension associated with chronic kidney disease due to type 2 diabetes mellitus ([place])   Cerebrovascular accident (CVA) ([place])   CAP (community acquired pneumonia)   Essential hypertension   Acute encephalopathy   Chronic combined systolic and diastolic congestive heart failure ([place])   Diarrhea   Sepsis and Acute respiratory failure with hypoxia due to HCAP and COPD exacerbation  - CXR showed infiltration in LML and LLL. Pt has diffused rhonchi indicating COPD exacerbation. PCCM was consulted and have evaluated patient, she is improving, signed off 2/10  - Continue broad-spectrum antibiotics with vancomycin and Zosyn - Influenza PCR negative, Streptococcus antigen negative - Continue steroids as well, continue Duonebs - Sepsis physiology resolved, transfer out of stepdown on telemetry today. Order physical therapy consult.  Recent diverticulitis with abscess - still has drain in place, followed by Dr. 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 Will touch base with IR on Monday re care / ?removal - C. difficile is negative  Recent history of Takatsubo cardiomyopathy - This was diagnosed in November 2017, 2-D echo at that time showed an ejection fraction of 35%, she was advised by cardiology and underwent cardiac catheterization which showed mild nonobstructive coronary artery disease - Repeat 2-D echo shows improving EF - Continue Lasix   DM-II - Last A1c 7.1 on 09/11/16, well controled. Patient is taking 70/30 insulin as a sliding scale at home - SSI while in-house  GERD - Protonix  HLD - Continue home medication  HTN - hold bp med due to hypotension on admission, now blood pressure is normal and continue to hold  Hx of CVA - continue lipitor and plavix  Acute encephalopathy  - Likely due to sepsis and multifactorial etiologies - Resolved, back to baseline  DVT prophylaxis: Lovenox Code Status: Full code Family Communication: no family bedside Disposition Plan: TBD  Consultants:   PCCM  Procedures:   2D echo Impressions: - When compared to te prior study from 09/14/2016 LVEF has improved from 30-35% to 40-45% with akinesis of the mid anteroseptal and apical septal walls. There is mild circumferential pericardial effusion with no signs of tamponade.  Antimicrobials:  Vancomycin 2/8 >>  Zosyn 2/8 >>   Subjective: - Feeling better, still has abdominal tenderness around the drain site  Objective: Vitals:   12/04/16 0400 12/04/16 0818 12/04/16 0900 12/04/16 1044  BP: 120/60 120/61  (!) 110/56  Pulse: 96 93  (!) 105  Resp: (!) 26 (!) 25  16  Temp:  97.7 F (36.5 C)  98.1 F (36.7 C)  TempSrc:  Oral  Oral  SpO2: 92% 91%  93% 93%  Weight:      Height:        Intake/Output Summary (Last 24 hours) at 12/04/16 1052 Last data filed at 12/04/16 0910  Gross per 24 hour  Intake              500 ml  Output                0 ml  Net              500  ml   Filed Weights   12/01/16 1930 12/02/16 0500  Weight: 66.2 kg (146 lb) 73 kg (160 lb 15 oz)    Examination: Constitutional: lethargic Vitals:   12/04/16 0400 12/04/16 0818 12/04/16 0900 12/04/16 1044  BP: 120/60 120/61  (!) 110/56  Pulse: 96 93  (!) 105  Resp: (!) 26 (!) 25  16  Temp:  97.7 F (36.5 C)  98.1 F (36.7 C)  TempSrc:  Oral  Oral  SpO2: 92% 91% 93% 93%  Weight:      Height:       Eyes: lids and conjunctivae normal ENMT: Mucous membranes are dry Respiratory: scattered Wheezing, bilateral crackles  Cardiovascular: Regular rate and rhythm, no murmurs / rubs / gallops. tachycardic Abdomen: + tenderness lower quadrants. Bowel sounds positive. Drain in place Musculoskeletal: no clubbing / cyanosis. Neurologic: non focal   Data Reviewed: I have personally reviewed following labs and imaging studies  CBC:  Recent Labs Lab 12/01/16 1946 12/03/16 0259 12/04/16 0536  WBC 11.2* 10.4 11.4*  NEUTROABS 10.0*  --   --   HGB 10.9* 8.9* 8.9*  HCT 33.7* 27.9* 27.0*  MCV 87.8 86.9 86.5  PLT 164 154 833   Basic Metabolic Panel:  Recent Labs Lab 12/01/16 1946 12/01/16 2204 12/02/16 0815 12/03/16 0259 12/04/16 0536  NA 135  --  140 136 138  K 3.2*  --  3.6 3.2* 3.0*  CL 104  --  106 103 108  CO2 21*  --  '23 24 22  '$ GLUCOSE 172*  --  195* 186* 160*  BUN 10  --  '10 10 13  '$ CREATININE 0.91  --  0.81 0.77 0.80  CALCIUM 8.2*  --  7.7* 7.9* 8.3*  MG  --  1.0*  --   --   --    GFR: Estimated Creatinine Clearance: 63 mL/min (by C-G formula based on SCr of 0.8 mg/dL). Liver Function Tests:  Recent Labs Lab 12/01/16 1946 12/03/16 0259  AST 20 16  ALT 15 12*  ALKPHOS 93 79  BILITOT 0.8 0.5  PROT 5.8* 5.3*  ALBUMIN 3.0* 2.3*   No results for input(s): LIPASE, AMYLASE in the last 168 hours. No results for input(s): AMMONIA in the last 168 hours. Coagulation Profile:  Recent Labs Lab 12/01/16 2204  INR 1.33   Cardiac Enzymes:  Recent Labs Lab  12/01/16 2204 12/02/16 0403 12/02/16 1015  TROPONINI 0.04* 0.04* 0.05*   BNP (last 3 results) No results for input(s): PROBNP in the last 8760 hours. HbA1C:  Recent Labs  12/02/16 0436  HGBA1C 6.5*   CBG:  Recent Labs Lab 12/03/16 1604 12/03/16 2050 12/03/16 2338 12/04/16 0350 12/04/16 0820  GLUCAP 156* 266* 243* 153* 130*   Lipid Profile:  Recent Labs  12/02/16 0403  CHOL 65  HDL 34*  LDLCALC 23  TRIG 42  CHOLHDL 1.9   Thyroid Function Tests:  Recent Labs  12/01/16 2204 12/02/16 0403 12/02/16 0436  TSH  --   --  0.228*  FREET4  --  1.09  --   T3FREE 2.0  --   --    Anemia Panel: No results for input(s): VITAMINB12, FOLATE, FERRITIN, TIBC, IRON, RETICCTPCT in the last 72 hours. Urine analysis:    Component Value Date/Time   COLORURINE YELLOW 12/02/2016 0239   APPEARANCEUR CLEAR 12/02/2016 0239   LABSPEC 1.016 12/02/2016 0239   PHURINE 5.0 12/02/2016 0239   GLUCOSEU 50 (A) 12/02/2016 0239   HGBUR NEGATIVE 12/02/2016 0239   BILIRUBINUR NEGATIVE 12/02/2016 0239   KETONESUR NEGATIVE 12/02/2016 0239   PROTEINUR NEGATIVE 12/02/2016 0239   UROBILINOGEN 1.0 02/03/2013 0912   NITRITE NEGATIVE 12/02/2016 0239   LEUKOCYTESUR NEGATIVE 12/02/2016 0239   Sepsis Labs: Invalid input(s): PROCALCITONIN, LACTICIDVEN  Recent Results (from the past 240 hour(s))  Blood Culture (routine x 2)     Status: None (Preliminary result)   Collection Time: 12/01/16  7:45 PM  Result Value Ref Range Status   Specimen Description BLOOD LEFT ANTECUBITAL  Final   Special Requests IN PEDIATRIC BOTTLE 3CC  Final   Culture NO GROWTH 2 DAYS  Final   Report Status PENDING  Incomplete  Blood Culture (routine x 2)     Status: None (Preliminary result)   Collection Time: 12/01/16  9:15 PM  Result Value Ref Range Status   Specimen Description BLOOD RIGHT HAND  Final   Special Requests IN PEDIATRIC BOTTLE 3CC  Final   Culture NO GROWTH 2 DAYS  Final   Report Status PENDING   Incomplete  C difficile quick scan w PCR reflex     Status: None   Collection Time: 12/02/16  1:17 AM  Result Value Ref Range Status   C Diff antigen NEGATIVE NEGATIVE Final   C Diff toxin NEGATIVE NEGATIVE Final   C Diff interpretation No C. difficile detected.  Final  Gastrointestinal Panel by PCR , Stool     Status: None   Collection Time: 12/02/16  1:17 AM  Result Value Ref Range Status   Campylobacter species NOT DETECTED NOT DETECTED Final   Plesimonas shigelloides NOT DETECTED NOT DETECTED Final   Salmonella species NOT DETECTED NOT DETECTED Final   Yersinia enterocolitica NOT DETECTED NOT DETECTED Final   Vibrio species NOT DETECTED NOT DETECTED Final   Vibrio cholerae NOT DETECTED NOT DETECTED Final   Enteroaggregative E coli (EAEC) NOT DETECTED NOT DETECTED Final   Enteropathogenic E coli (EPEC) NOT DETECTED NOT DETECTED Final   Enterotoxigenic E coli (ETEC) NOT DETECTED NOT DETECTED Final   Shiga like toxin producing E coli (STEC) NOT DETECTED NOT DETECTED Final   Shigella/Enteroinvasive E coli (EIEC) NOT DETECTED NOT DETECTED Final   Cryptosporidium NOT DETECTED NOT DETECTED Final   Cyclospora cayetanensis NOT DETECTED NOT DETECTED Final   Entamoeba histolytica NOT DETECTED NOT DETECTED Final   Giardia lamblia NOT DETECTED NOT DETECTED Final   Adenovirus F40/41 NOT DETECTED NOT DETECTED Final   Astrovirus NOT DETECTED NOT DETECTED Final   Norovirus GI/GII NOT DETECTED NOT DETECTED Final   Rotavirus A NOT DETECTED NOT DETECTED Final   Sapovirus (I, II, IV, and V) NOT DETECTED NOT DETECTED Final  MRSA PCR Screening     Status: None   Collection Time: 12/02/16  5:21 AM  Result Value Ref Range Status   MRSA by PCR NEGATIVE NEGATIVE Final    Comment:        The GeneXpert MRSA Assay (FDA approved for NASAL specimens only), is one component of a comprehensive MRSA colonization  surveillance program. It is not intended to diagnose MRSA infection nor to guide or monitor  treatment for MRSA infections.   Aerobic/Anaerobic Culture (surgical/deep wound)     Status: None (Preliminary result)   Collection Time: 12/02/16 12:35 PM  Result Value Ref Range Status   Specimen Description ABDOMEN  Final   Special Requests NONE  Final   Gram Stain   Final    RARE WBC PRESENT, PREDOMINANTLY PMN ABUNDANT GRAM POSITIVE COCCI IN PAIRS IN CHAINS ABUNDANT GRAM NEGATIVE RODS    Culture CULTURE REINCUBATED FOR BETTER GROWTH  Final   Report Status PENDING  Incomplete      Radiology Studies: Ct Abdomen Pelvis W Contrast  Result Date: 12/02/2016 CLINICAL DATA:  History of hypertension and diverticulitis with colonic fistula EXAM: CT ABDOMEN AND PELVIS WITH CONTRAST TECHNIQUE: Multidetector CT imaging of the abdomen and pelvis was performed using the standard protocol following bolus administration of intravenous contrast. CONTRAST:  141m ISOVUE-300 IOPAMIDOL (ISOVUE-300) INJECTION 61% COMPARISON:  10/06/2016 FINDINGS: Lower chest: New bilateral small pleural effusions are noted as well as bilateral pneumonia. Mild pericardial effusion is noted. Hepatobiliary: No focal liver abnormality is seen. Status post cholecystectomy. No biliary dilatation. Pancreas: Unremarkable. No pancreatic ductal dilatation or surrounding inflammatory changes. Spleen: Normal in size without focal abnormality. Adrenals/Urinary Tract: Adrenal glands are unremarkable. Kidneys are normal, without renal calculi, focal lesion, or hydronephrosis. Bladder is unremarkable. Stomach/Bowel: Changes of diverticulosis are identified. Drainage catheter is noted adjacent to the sigmoid colon consistent with history of prior abscess. No residual cavity is seen. The appendix is within normal limits. No obstructive changes are noted. Vascular/Lymphatic: Aortic atherosclerosis. No enlarged abdominal or pelvic lymph nodes. Reproductive: Status post hysterectomy. No adnexal masses. Other: No abdominal wall hernia or abnormality.  No abdominopelvic ascites. Musculoskeletal: No acute or significant osseous findings. IMPRESSION: Bilateral pleural effusions and bibasilar pneumonia which are new from the prior exam. Diverticulosis without diverticulitis. A drainage catheter is noted in place without significant residual abscess cavity. Small pericardial effusion. Electronically Signed   By: MInez CatalinaM.D.   On: 12/02/2016 15:23   Dg Chest Port 1 View  Result Date: 12/03/2016 CLINICAL DATA:  Respiratory failure EXAM: PORTABLE CHEST 1 VIEW COMPARISON:  12/01/2016 FINDINGS: Area dense consolidation again noted in the medial right upper lobe, stable since prior study. Continued airspace disease in the left lower lung, slightly improved. Heart is normal size. No definite effusions. No acute bony abnormality. IMPRESSION: Airspace disease in the left lower lobe could reflect pneumonia. This is slightly improved since prior study. Stable right upper lobe paramediastinal airspace disease. This could reflect radiation change although locally recurrent disease cannot be excluded. Electronically Signed   By: KRolm BaptiseM.D.   On: 12/03/2016 14:53     Scheduled Meds: . arformoterol  15 mcg Nebulization BID  . aspirin  81 mg Oral Daily  . atorvastatin  80 mg Oral Daily  . budesonide (PULMICORT) nebulizer solution  0.5 mg Nebulization BID  . clopidogrel  75 mg Oral Daily  . enoxaparin (LOVENOX) injection  40 mg Subcutaneous Q24H  . furosemide  40 mg Oral Daily  . guaiFENesin  1,200 mg Oral BID  . insulin aspart  0-15 Units Subcutaneous Q4H  . ipratropium-albuterol  3 mL Nebulization TID  . loratadine  10 mg Oral QHS  . mouth rinse  15 mL Mouth Rinse BID  . methylPREDNISolone (SOLU-MEDROL) injection  40 mg Intravenous Q12H  . pantoprazole  40 mg Oral Q breakfast  .  piperacillin-tazobactam (ZOSYN)  IV  3.375 g Intravenous Q8H  . vancomycin  500 mg Intravenous Q12H   Continuous Infusions:  Marzetta Board, MD, PhD [facility] Pager [phone]  If 7PM-7AM, please contact night-coverage [email or web address] Password [facility] 12/04/2016, 10:52 AM

## 2016-12-04 NOTE — Evaluation (Signed)
Physical Therapy Evaluation Patient Details Name: [name] MRN: [id number] DOB: 1945-10-31 Today's Date: 12/04/2016   History of Present Illness  Patient is a 71 yo female admitted 12/01/16 with acute resp failure with hypoxia, sepsis, AMS.   PMH:  DM, COPD, HLD, colonic diverticular abscess with drain in place, HTN, CKD, CVA, CHF, arthritis, CAD, memory loss, diabetic retinopathy  Clinical Impression  Patient presents with problems listed below.  Will benefit from acute PT to maximize functional mobility prior to discharge home with family.  Recommend HHPT for continued therapy at d/c.    Follow Up Recommendations Home health PT;Supervision for mobility/OOB    Equipment Recommendations  None recommended by PT    Recommendations for Other Services       Precautions / Restrictions Precautions Precautions: Fall Precaution Comments: Drain lower mid abdomen Restrictions Weight Bearing Restrictions: No      Mobility  Bed Mobility Overal bed mobility: Modified Independent             General bed mobility comments: Patient moves supine <> sit with increased time.  No assist needed.  Patient with good sitting balance.  Transfers                 General transfer comment: Patient declined - had been OOB earlier for several hours.  Ambulation/Gait                Stairs            Wheelchair Mobility    Modified Rankin (Stroke Patients Only)       Balance Overall balance assessment: Needs assistance Sitting-balance support: No upper extremity supported;Feet supported Sitting balance-Leahy Scale: Good                                       Pertinent Vitals/Pain Pain Assessment: No/denies pain    Home Living Family/patient expects to be discharged to:: Private residence Living Arrangements: Spouse/significant other;Children (Husband, daughter, grandson) Available Help at Discharge: Family;Available 24 hours/day;Home  health;Personal care attendant (Has HHRN and Aide; Sister also helps) Type of Home: House Home Access: Ramped entrance     Home Layout: One level Home Equipment: [place] - 2 wheels;Cane - single point;Shower seat;Bedside commode Additional Comments: pt's spouse recently diagnosed with cancer and undergoing radiation and is on home O2    Prior Function Level of Independence: Needs assistance   Gait / Transfers Assistance Needed: Uses assistive device at times.  Asking about electric scooter for longer distances.  ADL's / Homemaking Assistance Needed: Aide assists with bathing/dressing.  Family assists with meal prep and housekeeping.        Hand Dominance   Dominant Hand: Right    Extremity/Trunk Assessment   Upper Extremity Assessment Upper Extremity Assessment: Overall WFL for tasks assessed    Lower Extremity Assessment Lower Extremity Assessment: Generalized weakness       Communication   Communication: No difficulties  Cognition Arousal/Alertness: Awake/alert Behavior During Therapy: WFL for tasks assessed/performed Overall Cognitive Status: Within Functional Limits for tasks assessed                      General Comments      Exercises     Assessment/Plan    PT Assessment Patient needs continued PT services  PT Problem List Decreased strength;Decreased activity tolerance;Decreased balance;Decreased mobility;Decreased knowledge of use of DME;Cardiopulmonary status limiting activity;Obesity  PT Treatment Interventions DME instruction;Gait training;Functional mobility training;Therapeutic activities;Therapeutic exercise;Balance training;Patient/family education    PT Goals (Current goals can be found in the Care Plan section)  Acute Rehab PT Goals Patient Stated Goal: To return home PT Goal Formulation: With patient Time For Goal Achievement: 12/11/16 Potential to Achieve Goals: Good    Frequency Min 3X/week   Barriers to discharge         Co-evaluation               End of Session Equipment Utilized During Treatment: Oxygen Activity Tolerance: Patient limited by fatigue Patient left: in bed;with call bell/phone within reach Nurse Communication: Mobility status         Time: 5027-7412 PT Time Calculation (min) (ACUTE ONLY): 12 min   Charges:   PT Evaluation $PT Eval Moderate Complexity: 1 Procedure     PT G Codes:        [name] 12-29-2016, 8:08 PM Carita Pian. Sanjuana Kava, [place] Pager [phone]

## 2016-12-04 NOTE — Progress Notes (Signed)
Pharmacy Antibiotic Note  [name] is a 72 y.o. female admitted on 12/01/2016 with sepsis.  Pharmacy has been consulted for vancomycin and Zosyn dosing. Patient is afebrile, WBC slightly elevated (likely secondary to solumedrol) and CXR improving. Will continue for now due to PCT increasing slightly. Will hold off on vancomycin level since we will likely narrow soon.  Plan: Vancomycin '500mg'$  IV q12h Zosyn 3.375g IV q8h Monitor renal function, cultures, LOT, vancomycin level as indicated  Height: '5\' 2"'$  (157.5 cm) Weight: 160 lb 15 oz (73 kg) IBW/kg (Calculated) : 50.1  Temp (24hrs), Avg:97.7 F (36.5 C), Min:97.4 F (36.3 C), Max:98.1 F (36.7 C)   Recent Labs Lab 12/01/16 1946 12/01/16 2001 12/01/16 2217 12/02/16 0815 12/03/16 0259 12/04/16 0536  WBC 11.2*  --   --   --  10.4 11.4*  CREATININE 0.91  --   --  0.81 0.77 0.80  LATICACIDVEN  --  2.46* 1.85  --   --   --     Estimated Creatinine Clearance: 63 mL/min (by C-G formula based on SCr of 0.8 mg/dL).    Allergies  Allergen Reactions  . Lactose Intolerance (Gi) Diarrhea  . Ibuprofen Other (See Comments)    REACTION: Anxiousness, hyperventilates     Andrey Cota. Diona Foley, PharmD, [place] Clinical Pharmacist [phone] 12/04/2016

## 2016-12-05 ENCOUNTER — Inpatient Hospital Stay (HOSPITAL_COMMUNITY): Payer: Medicare Other

## 2016-12-05 ENCOUNTER — Other Ambulatory Visit: Payer: Self-pay | Admitting: *Deleted

## 2016-12-05 ENCOUNTER — Encounter (HOSPITAL_COMMUNITY): Payer: Self-pay | Admitting: Interventional Radiology

## 2016-12-05 HISTORY — PX: IR GENERIC HISTORICAL: IMG1180011

## 2016-12-05 LAB — EXPECTORATED SPUTUM ASSESSMENT W REFEX TO RESP CULTURE

## 2016-12-05 LAB — CBC
HEMATOCRIT: 32.7 % — AB (ref 36.0–46.0)
Hemoglobin: 10.5 g/dL — ABNORMAL LOW (ref 12.0–15.0)
MCH: 28.3 pg (ref 26.0–34.0)
MCHC: 32.1 g/dL (ref 30.0–36.0)
MCV: 88.1 fL (ref 78.0–100.0)
PLATELETS: 236 10*3/uL (ref 150–400)
RBC: 3.71 MIL/uL — ABNORMAL LOW (ref 3.87–5.11)
RDW: 15.6 % — AB (ref 11.5–15.5)
WBC: 15.4 10*3/uL — AB (ref 4.0–10.5)

## 2016-12-05 LAB — GLUCOSE, CAPILLARY
GLUCOSE-CAPILLARY: 101 mg/dL — AB (ref 65–99)
GLUCOSE-CAPILLARY: 122 mg/dL — AB (ref 65–99)
GLUCOSE-CAPILLARY: 142 mg/dL — AB (ref 65–99)
Glucose-Capillary: 136 mg/dL — ABNORMAL HIGH (ref 65–99)
Glucose-Capillary: 141 mg/dL — ABNORMAL HIGH (ref 65–99)
Glucose-Capillary: 160 mg/dL — ABNORMAL HIGH (ref 65–99)

## 2016-12-05 LAB — BASIC METABOLIC PANEL
ANION GAP: 12 (ref 5–15)
BUN: 11 mg/dL (ref 6–20)
CALCIUM: 8.9 mg/dL (ref 8.9–10.3)
CO2: 20 mmol/L — AB (ref 22–32)
CREATININE: 0.89 mg/dL (ref 0.44–1.00)
Chloride: 109 mmol/L (ref 101–111)
GLUCOSE: 108 mg/dL — AB (ref 65–99)
Potassium: 3.9 mmol/L (ref 3.5–5.1)
Sodium: 141 mmol/L (ref 135–145)

## 2016-12-05 LAB — EXPECTORATED SPUTUM ASSESSMENT W GRAM STAIN, RFLX TO RESP C

## 2016-12-05 MED ORDER — FUROSEMIDE 10 MG/ML IJ SOLN
40.0000 mg | Freq: Every day | INTRAMUSCULAR | Status: DC
  Administered 2016-12-05 – 2016-12-06 (×2): 40 mg via INTRAVENOUS
  Filled 2016-12-05 (×2): qty 4

## 2016-12-05 MED ORDER — LEVOFLOXACIN IN D5W 500 MG/100ML IV SOLN
500.0000 mg | INTRAVENOUS | Status: DC
  Administered 2016-12-05 – 2016-12-07 (×3): 500 mg via INTRAVENOUS
  Filled 2016-12-05 (×3): qty 100

## 2016-12-05 MED ORDER — POTASSIUM CHLORIDE CRYS ER 20 MEQ PO TBCR
40.0000 meq | EXTENDED_RELEASE_TABLET | Freq: Once | ORAL | Status: AC
Start: 2016-12-05 — End: 2016-12-05
  Administered 2016-12-05: 40 meq via ORAL
  Filled 2016-12-05: qty 2

## 2016-12-05 MED ORDER — FUROSEMIDE 10 MG/ML IJ SOLN: 40.0000 mg | Freq: Once | INTRAMUSCULAR | Status: DC

## 2016-12-05 MED ORDER — BOOST / RESOURCE BREEZE PO LIQD
1.0000 | Freq: Two times a day (BID) | ORAL | Status: DC
  Administered 2016-12-06: 1 via ORAL

## 2016-12-05 MED ORDER — IOPAMIDOL (ISOVUE-300) INJECTION 61%
INTRAVENOUS | Status: AC
  Administered 2016-12-05: 10 mL
  Filled 2016-12-05: qty 50

## 2016-12-05 MED ORDER — PREDNISONE 20 MG PO TABS
40.0000 mg | ORAL_TABLET | Freq: Every day | ORAL | Status: DC
  Administered 2016-12-06 – 2016-12-09 (×4): 40 mg via ORAL
  Filled 2016-12-05 (×4): qty 2

## 2016-12-05 NOTE — Progress Notes (Signed)
Brief Nutrition Follow-Up Note  Pt transferred from SDU to medical floor on 12/04/16.   Attempted to speak with pt and complete NFPE, however, pt was using bedside commode at time of visit. Pt underwent BSE on 12/03/16 and was advanced to dysphagia 2 diet with thin liquids related to increased respiratory rate and during mastication and ill-fitting dentition Pt was re-evaluated this AM and advanced to a dysphagia 3 diet with thin liquids. No meal completion data currently available. Per past RD note, pt accepted Boost Breeze supplements well. Will order Boost Breeze po BID, each supplement provides 250 kcal and 9 grams of protein.   RD will continue to follow.   Clayton Bosserman A. Jimmye Norman, RD, LDN, CDE Pager: [phone] After hours Pager: [phone]

## 2016-12-05 NOTE — Progress Notes (Signed)
PROGRESS NOTE  [name] FYB:[id number] DOB: 1946/04/17 DOA: 12/01/2016 PCP: [name], MD   LOS: 4 days   Brief Narrative: 71 y.o. female with medical history significant of hypertension, hyperlipidemia, diabetes mellitus, COPD, GERD, Torsade-de pointes, NSCLC 2010 (s/p of chemo and XTR, no surgery), stroke, diverticulitis, CAD, combined systolic and diastolic CHF with EF 52-77%, CKD-II, CAD, diverticular abscess with colonic fistular (has draining tube in place), who presents with cough, chest pain, fever, altered mental status, diarrhea.  Assessment & Plan: Principal Problem:   Acute respiratory failure with hypoxia (HCC) Active Problems:   Uncontrolled type 2 diabetes mellitus with complication (HCC)   GERD (gastroesophageal reflux disease)   COPD exacerbation (HCC)   Sepsis ([place])   Hyperlipidemia   Colonic diverticular abscess   Hypertension associated with chronic kidney disease due to type 2 diabetes mellitus ([place])   Cerebrovascular accident (CVA) ([place])   CAP (community acquired pneumonia)   Essential hypertension   Acute encephalopathy   Chronic combined systolic and diastolic congestive heart failure ([place])   Diarrhea   Sepsis and Acute respiratory failure with hypoxia due to HCAP and COPD exacerbation  - CXR showed infiltration in LML and LLL. Pt has diffused rhonchi indicating COPD exacerbation. PCCM was consulted and have evaluated patient, she is improving, signed off 2/10  - Continue broad-spectrum antibiotics with vancomycin and Zosyn - Influenza PCR negative, Streptococcus antigen negative - Continue steroids as well, continue Duonebs - Narrow antibiotics today as she is clinically improving  Recent diverticulitis with abscess - still has drain in place, followed by Dr. 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 Will  touch base with IR today regarding drain - C. difficile is negative  Recent history of Takatsubo cardiomyopathy / acute on chronic systolic CHF - This was diagnosed in November 2017, 2-D echo at that time showed an ejection fraction of 35%, she was advised by cardiology and underwent cardiac catheterization which showed mild nonobstructive coronary artery disease - Repeat 2-D echo shows improving EF - Acute on chronic component due to fluid resuscitation in the setting of sepsis - Continue Lasix, patient more congested today, we will change Lasix to IV  DM-II - Last A1c 7.1 on 09/11/16, well controled. Patient is taking 70/30 insulin as a sliding scale at home - SSI while in-house  GERD - Protonix  HLD - Continue home medication  HTN - hold bp med due to hypotension on admission, now blood pressure is normal and continue to hold  Hx of CVA - continue lipitor and plavix  Acute encephalopathy  - Likely due to sepsis and multifactorial etiologies - Resolved, back to baseline  DVT prophylaxis: Lovenox Code Status: Full code Family Communication: no family bedside Disposition Plan: TBD  Consultants:   PCCM  Procedures:   2D echo Impressions: - When compared to te prior study from 09/14/2016 LVEF has improved from 30-35% to 40-45% with akinesis of the mid anteroseptal and apical septal walls. There is mild circumferential pericardial effusion with no signs of tamponade.  Antimicrobials:  Vancomycin 2/8 >>  Zosyn 2/8 >>   Subjective: - Feeling better, still has abdominal tenderness around the drain site and feels short of breath this morning and wheezy  Objective: Vitals:   12/05/16 0451 12/05/16 0816 12/05/16 0819 12/05/16 0822  BP: (!) 124/58     Pulse: (!) 104  Resp: 18     Temp: 98.6 F (37 C)     TempSrc: Oral     SpO2: 91% 91% 90% 98%  Weight:      Height:        Intake/Output Summary (Last 24 hours) at 12/05/16 1008 Last data filed at  12/05/16 0756  Gross per 24 hour  Intake              100 ml  Output              300 ml  Net             -200 ml   Filed Weights   12/01/16 1930 12/02/16 0500  Weight: 66.2 kg (146 lb) 73 kg (160 lb 15 oz)    Examination: Constitutional: lethargic Vitals:   12/05/16 0451 12/05/16 0816 12/05/16 0819 12/05/16 0822  BP: (!) 124/58     Pulse: (!) 104     Resp: 18     Temp: 98.6 F (37 C)     TempSrc: Oral     SpO2: 91% 91% 90% 98%  Weight:      Height:       Eyes: lids and conjunctivae normal ENMT: Mucous membranes are dry Respiratory: scattered Wheezing, bilateral crackles  Cardiovascular: Regular rate and rhythm, no murmurs / rubs / gallops. tachycardic Abdomen: + tenderness lower quadrants. Bowel sounds positive. Drain in place Musculoskeletal: no clubbing / cyanosis. Neurologic: non focal   Data Reviewed: I have personally reviewed following labs and imaging studies  CBC:  Recent Labs Lab 12/01/16 1946 12/03/16 0259 12/04/16 0536  WBC 11.2* 10.4 11.4*  NEUTROABS 10.0*  --   --   HGB 10.9* 8.9* 8.9*  HCT 33.7* 27.9* 27.0*  MCV 87.8 86.9 86.5  PLT 164 154 300   Basic Metabolic Panel:  Recent Labs Lab 12/01/16 1946 12/01/16 2204 12/02/16 0815 12/03/16 0259 12/04/16 0536  NA 135  --  140 136 138  K 3.2*  --  3.6 3.2* 3.0*  CL 104  --  106 103 108  CO2 21*  --  '23 24 22  '$ GLUCOSE 172*  --  195* 186* 160*  BUN 10  --  '10 10 13  '$ CREATININE 0.91  --  0.81 0.77 0.80  CALCIUM 8.2*  --  7.7* 7.9* 8.3*  MG  --  1.0*  --   --   --    GFR: Estimated Creatinine Clearance: 63 mL/min (by C-G formula based on SCr of 0.8 mg/dL). Liver Function Tests:  Recent Labs Lab 12/01/16 1946 12/03/16 0259  AST 20 16  ALT 15 12*  ALKPHOS 93 79  BILITOT 0.8 0.5  PROT 5.8* 5.3*  ALBUMIN 3.0* 2.3*   No results for input(s): LIPASE, AMYLASE in the last 168 hours. No results for input(s): AMMONIA in the last 168 hours. Coagulation Profile:  Recent Labs Lab  12/01/16 2204  INR 1.33   Cardiac Enzymes:  Recent Labs Lab 12/01/16 2204 12/02/16 0403 12/02/16 1015  TROPONINI 0.04* 0.04* 0.05*   BNP (last 3 results) No results for input(s): PROBNP in the last 8760 hours. HbA1C: No results for input(s): HGBA1C in the last 72 hours. CBG:  Recent Labs Lab 12/04/16 1652 12/04/16 2019 12/05/16 0007 12/05/16 0449 12/05/16 0743  GLUCAP 214* 229* 141* 136* 101*   Lipid Profile: No results for input(s): CHOL, HDL, LDLCALC, TRIG, CHOLHDL, LDLDIRECT in the last 72 hours. Thyroid Function Tests: No results for input(s): TSH, T4TOTAL,  FREET4, T3FREE, THYROIDAB in the last 72 hours. Anemia Panel: No results for input(s): VITAMINB12, FOLATE, FERRITIN, TIBC, IRON, RETICCTPCT in the last 72 hours. Urine analysis:    Component Value Date/Time   COLORURINE YELLOW 12/02/2016 0239   APPEARANCEUR CLEAR 12/02/2016 0239   LABSPEC 1.016 12/02/2016 0239   PHURINE 5.0 12/02/2016 0239   GLUCOSEU 50 (A) 12/02/2016 0239   HGBUR NEGATIVE 12/02/2016 0239   BILIRUBINUR NEGATIVE 12/02/2016 0239   KETONESUR NEGATIVE 12/02/2016 0239   PROTEINUR NEGATIVE 12/02/2016 0239   UROBILINOGEN 1.0 02/03/2013 0912   NITRITE NEGATIVE 12/02/2016 0239   LEUKOCYTESUR NEGATIVE 12/02/2016 0239   Sepsis Labs: Invalid input(s): PROCALCITONIN, LACTICIDVEN  Recent Results (from the past 240 hour(s))  Blood Culture (routine x 2)     Status: None (Preliminary result)   Collection Time: 12/01/16  7:45 PM  Result Value Ref Range Status   Specimen Description BLOOD LEFT ANTECUBITAL  Final   Special Requests IN PEDIATRIC BOTTLE 3CC  Final   Culture NO GROWTH 3 DAYS  Final   Report Status PENDING  Incomplete  Blood Culture (routine x 2)     Status: None (Preliminary result)   Collection Time: 12/01/16  9:15 PM  Result Value Ref Range Status   Specimen Description BLOOD RIGHT HAND  Final   Special Requests IN PEDIATRIC BOTTLE 3CC  Final   Culture NO GROWTH 3 DAYS  Final    Report Status PENDING  Incomplete  C difficile quick scan w PCR reflex     Status: None   Collection Time: 12/02/16  1:17 AM  Result Value Ref Range Status   C Diff antigen NEGATIVE NEGATIVE Final   C Diff toxin NEGATIVE NEGATIVE Final   C Diff interpretation No C. difficile detected.  Final  Gastrointestinal Panel by PCR , Stool     Status: None   Collection Time: 12/02/16  1:17 AM  Result Value Ref Range Status   Campylobacter species NOT DETECTED NOT DETECTED Final   Plesimonas shigelloides NOT DETECTED NOT DETECTED Final   Salmonella species NOT DETECTED NOT DETECTED Final   Yersinia enterocolitica NOT DETECTED NOT DETECTED Final   Vibrio species NOT DETECTED NOT DETECTED Final   Vibrio cholerae NOT DETECTED NOT DETECTED Final   Enteroaggregative E coli (EAEC) NOT DETECTED NOT DETECTED Final   Enteropathogenic E coli (EPEC) NOT DETECTED NOT DETECTED Final   Enterotoxigenic E coli (ETEC) NOT DETECTED NOT DETECTED Final   Shiga like toxin producing E coli (STEC) NOT DETECTED NOT DETECTED Final   Shigella/Enteroinvasive E coli (EIEC) NOT DETECTED NOT DETECTED Final   Cryptosporidium NOT DETECTED NOT DETECTED Final   Cyclospora cayetanensis NOT DETECTED NOT DETECTED Final   Entamoeba histolytica NOT DETECTED NOT DETECTED Final   Giardia lamblia NOT DETECTED NOT DETECTED Final   Adenovirus F40/41 NOT DETECTED NOT DETECTED Final   Astrovirus NOT DETECTED NOT DETECTED Final   Norovirus GI/GII NOT DETECTED NOT DETECTED Final   Rotavirus A NOT DETECTED NOT DETECTED Final   Sapovirus (I, II, IV, and V) NOT DETECTED NOT DETECTED Final  MRSA PCR Screening     Status: None   Collection Time: 12/02/16  5:21 AM  Result Value Ref Range Status   MRSA by PCR NEGATIVE NEGATIVE Final    Comment:        The GeneXpert MRSA Assay (FDA approved for NASAL specimens only), is one component of a comprehensive MRSA colonization surveillance program. It is not intended to diagnose MRSA infection nor  to  guide or monitor treatment for MRSA infections.   Aerobic/Anaerobic Culture (surgical/deep wound)     Status: None   Collection Time: 12/02/16 12:35 PM  Result Value Ref Range Status   Specimen Description ABDOMEN  Final   Special Requests NONE  Final   Gram Stain   Final    RARE WBC PRESENT, PREDOMINANTLY PMN ABUNDANT GRAM POSITIVE COCCI IN PAIRS IN CHAINS ABUNDANT GRAM NEGATIVE RODS    Culture MULTIPLE ORGANISMS PRESENT, NONE PREDOMINANT  Final   Report Status 12/04/2016 FINAL  Final      Radiology Studies: Dg Chest Port 1 View  Result Date: 12/03/2016 CLINICAL DATA:  Respiratory failure EXAM: PORTABLE CHEST 1 VIEW COMPARISON:  12/01/2016 FINDINGS: Area dense consolidation again noted in the medial right upper lobe, stable since prior study. Continued airspace disease in the left lower lung, slightly improved. Heart is normal size. No definite effusions. No acute bony abnormality. IMPRESSION: Airspace disease in the left lower lobe could reflect pneumonia. This is slightly improved since prior study. Stable right upper lobe paramediastinal airspace disease. This could reflect radiation change although locally recurrent disease cannot be excluded. Electronically Signed   By: Rolm Baptise M.D.   On: 12/03/2016 14:53     Scheduled Meds: . arformoterol  15 mcg Nebulization BID  . aspirin  81 mg Oral Daily  . atorvastatin  80 mg Oral Daily  . budesonide (PULMICORT) nebulizer solution  0.5 mg Nebulization BID  . clopidogrel  75 mg Oral Daily  . enoxaparin (LOVENOX) injection  40 mg Subcutaneous Q24H  . furosemide  40 mg Intravenous Daily  . guaiFENesin  1,200 mg Oral BID  . insulin aspart  0-15 Units Subcutaneous Q4H  . ipratropium-albuterol  3 mL Nebulization TID  . loratadine  10 mg Oral QHS  . mouth rinse  15 mL Mouth Rinse BID  . methylPREDNISolone (SOLU-MEDROL) injection  40 mg Intravenous Q12H  . pantoprazole  40 mg Oral Q breakfast  . piperacillin-tazobactam (ZOSYN)  IV   3.375 g Intravenous Q8H  . vancomycin  500 mg Intravenous Q12H   Continuous Infusions:  Marzetta Board, MD, PhD [facility] Pager [phone]  If 7PM-7AM, please contact night-coverage [email or web address] Password [email or web address] 12/05/2016, 10:08 AM

## 2016-12-05 NOTE — Care Management Important Message (Signed)
Important Message  Patient Details  Name: [name] MRN: [id number] Date of Birth: 03-12-1946   Medicare Important Message Given:  Yes    Orbie Pyo 12/05/2016, 4:31 PM

## 2016-12-05 NOTE — Progress Notes (Signed)
Speech Language Pathology Treatment: Dysphagia  Patient Details Name: [name] MRN: [id number] DOB: 27-Oct-1945 Today's Date: 12/05/2016 Time: 4712-5271 SLP Time Calculation (min) (ACUTE ONLY): 9 min  Assessment / Plan / Recommendation Clinical Impression  [name] currently reports little difficulty with swallowing (slightly prolonged mastication) and mild SOB during mealtime. Pt was observed with thin liquids via straw and regular solids with no overt s/s of airway compromise at bedside, however increased respiration noted during mastication of solids (SOB prior to POs). Educated re: swallowing and increased SOB during meals and advised to take rest breaks; pt was in agreement. Recommend upgrade from Dys 2 to Dys 3 solids, continue with thin liquids (straws ok), meds whole in puree. ST will continue to f/u to treatment for possible diet upgrade to reg solids as respiratory status improves.   HPI HPI: [name] is a 71 y.o. female with PMH including NSCLC (2012, s/p chemo and XRT), CVA, COPD, GERD, mild cognitive impairment with memory loss. She was brought to [facility] ED 02/08 with cough, fever, AMS, diarrhea. She was found to have multifocal PNA and hypotension with SBP in low 80's.  She received 2.5L NS and SBP improved to mid 90's and occasionally in low 100's. She had admission 09/11/16 through 09/27/16 for sepsis due to diverticulitis with abscess formation and had drain placed via IR 09/12/16. Pt seen by SLP during prior admission s/p small left parietal CVA 08/2016. Was placed on dysphagia 3, nectar-thick liquids following coughing, changes in vital signs with thin liquids, was upgraded to dysphagia 3 with thin liquids prior to discharge, with follow-up in SNF recommended for swallowing. Notes indicated pt refused nectar-thick liquids. Chest x-ray 12/03/16 showed Airspace disease in the left lower lobe could reflect pneumonia.       SLP Plan  Continue with current plan of care      Recommendations  Diet recommendations: Dysphagia 3 (mechanical soft);Thin liquid Liquids provided via: Cup;Straw Medication Administration: Crushed with puree Supervision: Patient able to self feed;Intermittent supervision to cue for compensatory strategies Compensations: Slow rate;Small sips/bites Postural Changes and/or Swallow Maneuvers: Seated upright 90 degrees                Oral Care Recommendations: Oral care BID Follow up Recommendations: Other (comment) (TBD) Plan: Continue with current plan of care       GO                Fransisca Kaufmann 12/05/2016, 1:05 PM

## 2016-12-05 NOTE — Progress Notes (Signed)
Physical Therapy Treatment Patient Details Name: [name] MRN: [id number] DOB: 1946-09-05 Today's Date: 12/05/2016    History of Present Illness Patient is a 71 yo female admitted 12/01/16 with acute resp failure with hypoxia, sepsis, AMS.   PMH:  DM, COPD, HLD, colonic diverticular abscess with drain in place, HTN, CKD, CVA, CHF, arthritis, CAD, memory loss, diabetic retinopathy    PT Comments    Session focused on activity tolerance, and progressive amb; O2 sats decr to 83-85% with activity on 2 L supplemental O2; RN notified; it is likely she will need home O2 -- will plan for O2 qualifying walk next session   Follow Up Recommendations  Home health PT;Supervision for mobility/OOB     Equipment Recommendations  Other (comment) (will likely need Home O2)    Recommendations for Other Services OT consult     Precautions / Restrictions Precautions Precautions: Fall Precaution Comments: Drain lower mid abdomen Restrictions Weight Bearing Restrictions: No    Mobility  Bed Mobility                  Transfers Overall transfer level: Needs assistance Equipment used: None Transfers: Sit to/from Stand Sit to Stand: Min guard (without physical contact)         General transfer comment: Noted dependent on UE push for rise; no need for physical assist  Ambulation/Gait Ambulation/Gait assistance: Min guard Ambulation Distance (Feet): 75 Feet Assistive device:  (pushing Dinamap) Gait Pattern/deviations: Step-through pattern;Decreased step length - right;Decreased step length - left;Decreased stride length     General Gait Details: Needing cues to control breathing, and to breathe deeply in through nose for supplemental O2; cues also to self-monitor for activity tolerance; very fatigued at end of walk   Stairs            Wheelchair Mobility    Modified Rankin (Stroke Patients Only)       Balance     Sitting balance-Leahy Scale: Good        Standing balance-Leahy Scale: Fair                      Cognition Arousal/Alertness: Awake/alert Behavior During Therapy: WFL for tasks assessed/performed Overall Cognitive Status: Within Functional Limits for tasks assessed                      Exercises      General Comments General comments (skin integrity, edema, etc.): O2 sats decr to 83-85% with activity on 2 L supplemental O2; RN notified      Pertinent Vitals/Pain Pain Assessment: Faces Faces Pain Scale: Hurts a little bit Pain Location: Did not specify Pain Descriptors / Indicators: Grimacing Pain Intervention(s): Monitored during session    Home Living                      Prior Function            PT Goals (current goals can now be found in the care plan section) Acute Rehab PT Goals Patient Stated Goal: To return home PT Goal Formulation: With patient Time For Goal Achievement: 12/11/16 Potential to Achieve Goals: Good Progress towards PT goals: Progressing toward goals    Frequency    Min 3X/week      PT Plan Current plan remains appropriate    Co-evaluation             End of Session Equipment Utilized During Treatment: Oxygen Activity Tolerance: Patient  tolerated treatment well;Patient limited by fatigue Patient left: in chair;with call bell/phone within reach;with nursing/sitter in room     Time: 1129-1204 PT Time Calculation (min) (ACUTE ONLY): 35 min  Charges:  $Gait Training: 8-22 mins $Therapeutic Activity: 8-22 mins                    G Codes:      Colletta Maryland 2016-12-27, 1:37 PM  Roney Marion, [place] Pager [phone] Office [phone]

## 2016-12-05 NOTE — Procedures (Signed)
Persistent fistulous connection btwn the end of the drain and the adjacent sigmoid colon.  Pt to follow-up in IR drain clinic in 2 weeks.  Ronny Bacon, MD Pager #: [phone]

## 2016-12-06 ENCOUNTER — Other Ambulatory Visit: Payer: Self-pay

## 2016-12-06 LAB — COMPREHENSIVE METABOLIC PANEL
ALK PHOS: 63 U/L (ref 38–126)
ALT: 15 U/L (ref 14–54)
ANION GAP: 8 (ref 5–15)
AST: 20 U/L (ref 15–41)
Albumin: 2.4 g/dL — ABNORMAL LOW (ref 3.5–5.0)
BILIRUBIN TOTAL: 0.5 mg/dL (ref 0.3–1.2)
BUN: 9 mg/dL (ref 6–20)
CALCIUM: 8.5 mg/dL — AB (ref 8.9–10.3)
CO2: 22 mmol/L (ref 22–32)
Chloride: 108 mmol/L (ref 101–111)
Creatinine, Ser: 0.85 mg/dL (ref 0.44–1.00)
GFR calc Af Amer: >60 mL/min (ref >60)
Glucose, Bld: 123 mg/dL — ABNORMAL HIGH (ref 65–99)
POTASSIUM: 4 mmol/L (ref 3.5–5.1)
Sodium: 138 mmol/L (ref 135–145)
TOTAL PROTEIN: 5.8 g/dL — AB (ref 6.5–8.1)

## 2016-12-06 LAB — CULTURE, BLOOD (ROUTINE X 2)
CULTURE: NO GROWTH
Culture: NO GROWTH

## 2016-12-06 LAB — GLUCOSE, CAPILLARY
GLUCOSE-CAPILLARY: 102 mg/dL — AB (ref 65–99)
GLUCOSE-CAPILLARY: 131 mg/dL — AB (ref 65–99)
GLUCOSE-CAPILLARY: 131 mg/dL — AB (ref 65–99)
GLUCOSE-CAPILLARY: 219 mg/dL — AB (ref 65–99)
Glucose-Capillary: 198 mg/dL — ABNORMAL HIGH (ref 65–99)
Glucose-Capillary: 221 mg/dL — ABNORMAL HIGH (ref 65–99)

## 2016-12-06 LAB — CBC
HEMATOCRIT: 29.1 % — AB (ref 36.0–46.0)
HEMOGLOBIN: 9.4 g/dL — AB (ref 12.0–15.0)
MCH: 28.5 pg (ref 26.0–34.0)
MCHC: 32.3 g/dL (ref 30.0–36.0)
MCV: 88.2 fL (ref 78.0–100.0)
Platelets: 192 10*3/uL (ref 150–400)
RBC: 3.3 MIL/uL — ABNORMAL LOW (ref 3.87–5.11)
RDW: 15.8 % — AB (ref 11.5–15.5)
WBC: 9.5 10*3/uL (ref 4.0–10.5)

## 2016-12-06 LAB — PROCALCITONIN: Procalcitonin: 1.86 ng/mL

## 2016-12-06 MED ORDER — ZOLPIDEM TARTRATE 5 MG PO TABS
5.0000 mg | ORAL_TABLET | Freq: Every evening | ORAL | Status: DC | PRN
  Administered 2016-12-06 – 2016-12-08 (×3): 5 mg via ORAL
  Filled 2016-12-06 (×3): qty 1

## 2016-12-06 MED ORDER — FUROSEMIDE 10 MG/ML IJ SOLN
40.0000 mg | Freq: Two times a day (BID) | INTRAMUSCULAR | Status: DC
  Administered 2016-12-06 – 2016-12-07 (×2): 40 mg via INTRAVENOUS
  Filled 2016-12-06 (×2): qty 4

## 2016-12-06 NOTE — Progress Notes (Signed)
pts IV infiltrated, IV Levaquin stopped and IV removed heat applied and site marked Md notified will continue to monitor

## 2016-12-06 NOTE — Progress Notes (Signed)
PROGRESS NOTE  [name] CWC:[id number] DOB: March 25, 1946 DOA: 12/01/2016 PCP: Rachael Sou, MD   LOS: 5 days   Brief Narrative: 71 y.o. female with medical history significant of hypertension, hyperlipidemia, diabetes mellitus, COPD, GERD, Torsade-de pointes, NSCLC 2010 (s/p of chemo and XTR, no surgery), stroke, diverticulitis, CAD, combined systolic and diastolic CHF with EF 76-16%, CKD-II, CAD, diverticular abscess with colonic fistular (has draining tube in place), who presents with cough, chest pain, fever, altered mental status, diarrhea.  Assessment & Plan: Principal Problem:   Acute respiratory failure with hypoxia (HCC) Active Problems:   Uncontrolled type 2 diabetes mellitus with complication (HCC)   GERD (gastroesophageal reflux disease)   COPD exacerbation (HCC)   Sepsis ([name])   Hyperlipidemia   Colonic diverticular abscess   Hypertension associated with chronic kidney disease due to type 2 diabetes mellitus ([name])   Cerebrovascular accident (CVA) ([name])   CAP (community acquired pneumonia)   Essential hypertension   Acute encephalopathy   Chronic combined systolic and diastolic congestive heart failure ([name])   Diarrhea   Sepsis and Acute respiratory failure with hypoxia due to HCAP and COPD exacerbation  - CXR showed infiltration in LML and LLL. Pt has diffused rhonchi indicating COPD exacerbation. PCCM was consulted and have evaluated patient, she is improving, signed off 2/10  - Continue broad-spectrum antibiotics with vancomycin and Zosyn - Influenza PCR negative, Streptococcus antigen negative - Continue steroids as well, continue Duonebs - Narrow antibiotics 2/12, as she is clinically improving  Recent diverticulitis with abscess / diarrhea - still has drain in place, followed by Dr. 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 difficile is negative  Acute on chronic systolic CHF / recent history of Takatsubo cardiomyopathy - This was diagnosed in November 2017, 2-D echo at that time showed an ejection fraction of 35%, she was advised by cardiology and underwent cardiac catheterization which showed mild nonobstructive coronary artery disease - Repeat 2-D echo now shows improving EF - Acute on chronic component due to fluid resuscitation in the setting of sepsis - still fluid overloaded and hypoxic, continue IV lasix, daily weights. Still with pulmonary congestion on exam  DM-II - Last A1c 7.1 on 09/11/16, well controled. Patient is taking 70/30 insulin as a sliding scale at home - SSI while in-house  GERD - Protonix  HLD - Continue home medication  HTN - hold bp med due to hypotension on admission, now blood pressure is normal and continue to hold  Hx of CVA - continue lipitor and plavix  Acute encephalopathy  - Likely due to sepsis and multifactorial etiologies - Resolved, back to baseline   DVT prophylaxis: Lovenox Code Status: Full code Family Communication: no family bedside, d/w sisters 2/12 Disposition Plan: TBD  Consultants:   PCCM  Procedures:   2D echo Impressions: - When compared to te prior study from 09/14/2016 LVEF has improved from 30-35% to 40-45% with akinesis of the mid anteroseptal and apical septal walls. There is mild circumferential pericardial effusion with no signs of tamponade.  Antimicrobials:  Vancomycin 2/8 >> 2/12  Zosyn 2/8 >> 2/12  Levaquin 2/12 >>  Subjective: - Feeling better, still has abdominal tenderness around the drain site and feels short of breath this morning and wheezy  Objective: Vitals:   12/06/16 0737 12/06/16 0759 12/06/16 0801 12/06/16 1062  BP: (!) 111/55     Pulse: (!) 109     Resp: 20     Temp: 99.4 F (37.4 C)     TempSrc: Oral     SpO2: 92% 92% 94% 94%  Weight:      Height:         Intake/Output Summary (Last 24 hours) at 12/06/16 1317 Last data filed at 12/06/16 0654  Gross per 24 hour  Intake              340 ml  Output                0 ml  Net              340 ml   Filed Weights   12/01/16 1930 12/02/16 0500  Weight: 66.2 kg (146 lb) 73 kg (160 lb 15 oz)    Examination: Constitutional: lethargic Vitals:   12/06/16 0415 12/06/16 0759 12/06/16 0801 12/06/16 0804  BP: (!) 111/55     Pulse: (!) 109     Resp: 20     Temp: 99.4 F (37.4 C)     TempSrc: Oral     SpO2: 92% 92% 94% 94%  Weight:      Height:       Eyes: lids and conjunctivae normal ENMT: Mucous membranes are dry Respiratory: scattered Wheezing, bilateral crackles  Cardiovascular: Regular rate and rhythm, no murmurs / rubs / gallops. tachycardic Abdomen: + tenderness lower quadrants. Bowel sounds positive. Drain in place Musculoskeletal: no clubbing / cyanosis. Neurologic: non focal   Data Reviewed: I have personally reviewed following labs and imaging studies  CBC:  Recent Labs Lab 12/01/16 1946 12/03/16 0259 12/04/16 0536 12/05/16 0959 12/06/16 0659  WBC 11.2* 10.4 11.4* 15.4* 9.5  NEUTROABS 10.0*  --   --   --   --   HGB 10.9* 8.9* 8.9* 10.5* 9.4*  HCT 33.7* 27.9* 27.0* 32.7* 29.1*  MCV 87.8 86.9 86.5 88.1 88.2  PLT 164 154 186 236 989   Basic Metabolic Panel:  Recent Labs Lab 12/01/16 2204 12/02/16 0815 12/03/16 0259 12/04/16 0536 12/05/16 0959 12/06/16 0659  NA  --  140 136 138 141 138  K  --  3.6 3.2* 3.0* 3.9 4.0  CL  --  106 103 108 109 108  CO2  --  '23 24 22 '$ 20* 22  GLUCOSE  --  195* 186* 160* 108* 123*  BUN  --  '10 10 13 11 9  '$ CREATININE  --  0.81 0.77 0.80 0.89 0.85  CALCIUM  --  7.7* 7.9* 8.3* 8.9 8.5*  MG 1.0*  --   --   --   --   --    GFR: Estimated Creatinine Clearance: 59.3 mL/min (by C-G formula based on SCr of 0.85 mg/dL). Liver Function Tests:  Recent Labs Lab 12/01/16 1946 12/03/16 0259 12/06/16 0659  AST '20 16 20  '$ ALT 15 12*  15  ALKPHOS 93 79 63  BILITOT 0.8 0.5 0.5  PROT 5.8* 5.3* 5.8*  ALBUMIN 3.0* 2.3* 2.4*   No results for input(s): LIPASE, AMYLASE in the last 168 hours. No results for input(s): AMMONIA in the last 168 hours. Coagulation Profile:  Recent Labs Lab 12/01/16 2204  INR 1.33   Cardiac Enzymes:  Recent Labs Lab 12/01/16 2204 12/02/16 0403 12/02/16 1015  TROPONINI 0.04* 0.04* 0.05*   BNP (last 3 results) No results for input(s): PROBNP in the last 8760 hours. HbA1C: No results  for input(s): HGBA1C in the last 72 hours. CBG:  Recent Labs Lab 12/05/16 1956 12/06/16 0009 12/06/16 0413 12/06/16 0758 12/06/16 1219  GLUCAP 142* 131* 131* 102* 198*   Lipid Profile: No results for input(s): CHOL, HDL, LDLCALC, TRIG, CHOLHDL, LDLDIRECT in the last 72 hours. Thyroid Function Tests: No results for input(s): TSH, T4TOTAL, FREET4, T3FREE, THYROIDAB in the last 72 hours. Anemia Panel: No results for input(s): VITAMINB12, FOLATE, FERRITIN, TIBC, IRON, RETICCTPCT in the last 72 hours. Urine analysis:    Component Value Date/Time   COLORURINE YELLOW 12/02/2016 0239   APPEARANCEUR CLEAR 12/02/2016 0239   LABSPEC 1.016 12/02/2016 0239   PHURINE 5.0 12/02/2016 0239   GLUCOSEU 50 (A) 12/02/2016 0239   HGBUR NEGATIVE 12/02/2016 0239   BILIRUBINUR NEGATIVE 12/02/2016 0239   KETONESUR NEGATIVE 12/02/2016 0239   PROTEINUR NEGATIVE 12/02/2016 0239   UROBILINOGEN 1.0 02/03/2013 0912   NITRITE NEGATIVE 12/02/2016 0239   LEUKOCYTESUR NEGATIVE 12/02/2016 0239   Sepsis Labs: Invalid input(s): PROCALCITONIN, LACTICIDVEN  Recent Results (from the past 240 hour(s))  Blood Culture (routine x 2)     Status: None (Preliminary result)   Collection Time: 12/01/16  7:45 PM  Result Value Ref Range Status   Specimen Description BLOOD LEFT ANTECUBITAL  Final   Special Requests IN PEDIATRIC BOTTLE 3CC  Final   Culture NO GROWTH 4 DAYS  Final   Report Status PENDING  Incomplete  Blood Culture  (routine x 2)     Status: None (Preliminary result)   Collection Time: 12/01/16  9:15 PM  Result Value Ref Range Status   Specimen Description BLOOD RIGHT HAND  Final   Special Requests IN PEDIATRIC BOTTLE 3CC  Final   Culture NO GROWTH 4 DAYS  Final   Report Status PENDING  Incomplete  C difficile quick scan w PCR reflex     Status: None   Collection Time: 12/02/16  1:17 AM  Result Value Ref Range Status   C Diff antigen NEGATIVE NEGATIVE Final   C Diff toxin NEGATIVE NEGATIVE Final   C Diff interpretation No C. difficile detected.  Final  Gastrointestinal Panel by PCR , Stool     Status: None   Collection Time: 12/02/16  1:17 AM  Result Value Ref Range Status   Campylobacter species NOT DETECTED NOT DETECTED Final   Plesimonas shigelloides NOT DETECTED NOT DETECTED Final   Salmonella species NOT DETECTED NOT DETECTED Final   Yersinia enterocolitica NOT DETECTED NOT DETECTED Final   Vibrio species NOT DETECTED NOT DETECTED Final   Vibrio cholerae NOT DETECTED NOT DETECTED Final   Enteroaggregative E coli (EAEC) NOT DETECTED NOT DETECTED Final   Enteropathogenic E coli (EPEC) NOT DETECTED NOT DETECTED Final   Enterotoxigenic E coli (ETEC) NOT DETECTED NOT DETECTED Final   Shiga like toxin producing E coli (STEC) NOT DETECTED NOT DETECTED Final   Shigella/Enteroinvasive E coli (EIEC) NOT DETECTED NOT DETECTED Final   Cryptosporidium NOT DETECTED NOT DETECTED Final   Cyclospora cayetanensis NOT DETECTED NOT DETECTED Final   Entamoeba histolytica NOT DETECTED NOT DETECTED Final   Giardia lamblia NOT DETECTED NOT DETECTED Final   Adenovirus F40/41 NOT DETECTED NOT DETECTED Final   Astrovirus NOT DETECTED NOT DETECTED Final   Norovirus GI/GII NOT DETECTED NOT DETECTED Final   Rotavirus A NOT DETECTED NOT DETECTED Final   Sapovirus (I, II, IV, and V) NOT DETECTED NOT DETECTED Final  MRSA PCR Screening     Status: None   Collection Time: 12/02/16  5:21 AM  Result Value Ref Range Status    MRSA by PCR NEGATIVE NEGATIVE Final    Comment:        The GeneXpert MRSA Assay (FDA approved for NASAL specimens only), is one component of a comprehensive MRSA colonization surveillance program. It is not intended to diagnose MRSA infection nor to guide or monitor treatment for MRSA infections.   Aerobic/Anaerobic Culture (surgical/deep wound)     Status: None   Collection Time: 12/02/16 12:35 PM  Result Value Ref Range Status   Specimen Description ABDOMEN  Final   Special Requests NONE  Final   Gram Stain   Final    RARE WBC PRESENT, PREDOMINANTLY PMN ABUNDANT GRAM POSITIVE COCCI IN PAIRS IN CHAINS ABUNDANT GRAM NEGATIVE RODS    Culture MULTIPLE ORGANISMS PRESENT, NONE PREDOMINANT  Final   Report Status 12/04/2016 FINAL  Final  Culture, sputum-assessment     Status: None   Collection Time: 12/05/16  9:59 AM  Result Value Ref Range Status   Specimen Description EXPECTORATED SPUTUM  Final   Special Requests NONE  Final   Sputum evaluation THIS SPECIMEN IS ACCEPTABLE FOR SPUTUM CULTURE  Final   Report Status 12/05/2016 FINAL  Final  Culture, respiratory (NON-Expectorated)     Status: None (Preliminary result)   Collection Time: 12/05/16  9:59 AM  Result Value Ref Range Status   Specimen Description EXPECTORATED SPUTUM  Final   Special Requests NONE Reflexed from [id number]  Final   Gram Stain   Final    MODERATE WBC PRESENT, PREDOMINANTLY PMN NO ORGANISMS SEEN    Culture CULTURE REINCUBATED FOR BETTER GROWTH  Final   Report Status PENDING  Incomplete      Radiology Studies: Ir Sinus/fist Tube Chk-non Gi  Result Date: 12/05/2016 CLINICAL DATA:  History of diverticular abscess, post percutaneous drainage catheter placement initially on 09/12/2016 and replacement on 09/21/2016. CT scan of the abdomen and pelvis performed 10/06/2016 demonstrates resolution of the diverticular abscess however subsequent percutaneous drainage catheter injections performed 10/06/2016,  10/26/2016, 11/03/2016 and 11/24/2016 new all have demonstrated a persistent fistulous connection between the end of the percutaneous drainage catheter in the adjacent sigmoid colon. Patient presents today for repeat fluoroscopic guided drainage catheter injection. The patient states she is intermittently flushing the percutaneous drain. EXAM: SINUS TRACT INJECTION/FISTULOGRAM COMPARISON:  CT scan abdomen and pelvis -09/11/2016; 10/06/2016; CT-guided percutaneous drainage catheter placement- 09/12/2016; 09/21/2016; fluoroscopic guided percutaneous drainage catheter injection - 10/06/2016; 10/26/2016; 11/03/2016; 11/24/2016 CONTRAST:  10 cc Isovue 300, administered via the existing percutaneous drainage catheter FLUOROSCOPY TIME:  12 seconds (1 mGy) TECHNIQUE: The patient was positioned supine on the fluoroscopy table. A preprocedural spot fluoroscopic image was obtained of the lower abdomen and pelvis and existing percutaneous drainage catheter Multiple spot fluoroscopic and radiographic images were obtained following the injection of a small amount of contrast via the existing percutaneous drainage catheter. Images reviewed in the procedure was terminated. The drainage catheter was flushed with a small amount of saline and reconnected to a gravity bag. FINDINGS: Preprocedural spot fluoroscopic image demonstrates unchanged positioning of the percutaneous drainage catheter with and coiled and locked over lying the midline of the lower pelvis Contrast injection demonstrates opacification of the decompressed abscess cavity however there is brisk communication between the end of the percutaneous drainage catheter and the adjacent sigmoid colon. 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 PLAN: - the patient was instructed to no longer flush the  percutaneous drainage catheter. - the patient was instructed to maintain diligent records  regarding drainage catheter output. - the patient will follow-up in the interventional radiology drain clinic in 2 weeks for repeat drainage catheter injection and evaluation and management Electronically Signed   By: Sandi Mariscal M.D.   On: 12/05/2016 15:46     Scheduled Meds: . arformoterol  15 mcg Nebulization BID  . aspirin  81 mg Oral Daily  . atorvastatin  80 mg Oral Daily  . budesonide (PULMICORT) nebulizer solution  0.5 mg Nebulization BID  . clopidogrel  75 mg Oral Daily  . enoxaparin (LOVENOX) injection  40 mg Subcutaneous Q24H  . feeding supplement  1 Container Oral BID BM  . furosemide  40 mg Intravenous Daily  . guaiFENesin  1,200 mg Oral BID  . insulin aspart  0-15 Units Subcutaneous Q4H  . ipratropium-albuterol  3 mL Nebulization TID  . levofloxacin (LEVAQUIN) IV  500 mg Intravenous Q24H  . loratadine  10 mg Oral QHS  . mouth rinse  15 mL Mouth Rinse BID  . pantoprazole  40 mg Oral Q breakfast  . predniSONE  40 mg Oral Q breakfast   Continuous Infusions:  Marzetta Board, MD, PhD [facility] Pager [phone]  If 7PM-7AM, please contact night-coverage [email or web address] Password [email or web address] 12/06/2016, 1:17 PM

## 2016-12-06 NOTE — Progress Notes (Signed)
Physical Therapy Treatment Patient Details Name: [name] MRN: [id number] DOB: [birth year] Today's Date: 12/06/2016    History of Present Illness Patient is a 71 yo female admitted 12/01/16 with acute resp failure with hypoxia, sepsis, AMS.   PMH:  DM, COPD, HLD, colonic diverticular abscess with drain in place, HTN, CKD, CVA, CHF, arthritis, CAD, memory loss, diabetic retinopathy    PT Comments    Patient continues to de-sat with ambulation. Her Sao2 dropped to 77% after ambulation to the bathroom and back. Therapy will continue to follow for endurance training.   Follow Up Recommendations  Home health PT;Supervision for mobility/OOB     Equipment Recommendations  Other (comment)    Recommendations for Other Services       Precautions / Restrictions Precautions Precautions: Fall Precaution Comments: Drain lower mid abdomen Restrictions Weight Bearing Restrictions: No    Mobility  Bed Mobility                  Transfers Overall transfer level: Needs assistance Equipment used: None Transfers: Sit to/from Stand Sit to Stand: Min guard (without physical contact)         General transfer comment: Noted dependent on UE push for rise; no need for physical assist  Ambulation/Gait Ambulation/Gait assistance: Min guard Ambulation Distance (Feet): 30 Feet (15'x2)   Gait Pattern/deviations: Step-through pattern;Decreased step length - right;Decreased step length - left;Decreased stride length     General Gait Details: Needing cues to control breathing, and to breathe deeply in through nose for supplemental O2; cues also to self-monitor for activity tolerance; very fatigued at end of walk   Stairs            Wheelchair Mobility    Modified Rankin (Stroke Patients Only)       Balance     Sitting balance-Leahy Scale: Good       Standing balance-Leahy Scale: Fair                      Cognition Arousal/Alertness: Awake/alert Behavior  During Therapy: WFL for tasks assessed/performed Overall Cognitive Status: Within Functional Limits for tasks assessed                      Exercises      General Comments General comments (skin integrity, edema, etc.): O2 sats down to 77% after walking to the bathroom and back. Baseline Sao2 at 89% on 2L of Sao2. After 5 minutes of rest sao2 back up.       Pertinent Vitals/Pain Pain Assessment: Faces Faces Pain Scale: No hurt    Home Living                      Prior Function            PT Goals (current goals can now be found in the care plan section) Acute Rehab PT Goals Patient Stated Goal: To return home PT Goal Formulation: With patient Time For Goal Achievement: 12/11/16 Potential to Achieve Goals: Good Progress towards PT goals: Progressing toward goals    Frequency    Min 3X/week      PT Plan Current plan remains appropriate    Co-evaluation             End of Session Equipment Utilized During Treatment: Oxygen Activity Tolerance: Patient tolerated treatment well;Patient limited by fatigue Patient left: in chair;with call bell/phone within reach;with nursing/sitter in room     Time:  1000-1021 PT Time Calculation (min) (ACUTE ONLY): 21 min  Charges:  $Gait Training: 8-22 mins                    G Codes:      Carney Living PT DPT  12/06/2016, 12:56 PM

## 2016-12-07 LAB — GLUCOSE, CAPILLARY
GLUCOSE-CAPILLARY: 129 mg/dL — AB (ref 65–99)
GLUCOSE-CAPILLARY: 137 mg/dL — AB (ref 65–99)
GLUCOSE-CAPILLARY: 138 mg/dL — AB (ref 65–99)
GLUCOSE-CAPILLARY: 148 mg/dL — AB (ref 65–99)
GLUCOSE-CAPILLARY: 245 mg/dL — AB (ref 65–99)
Glucose-Capillary: 155 mg/dL — ABNORMAL HIGH (ref 65–99)
Glucose-Capillary: 205 mg/dL — ABNORMAL HIGH (ref 65–99)
Glucose-Capillary: 82 mg/dL (ref 65–99)

## 2016-12-07 LAB — BASIC METABOLIC PANEL
Anion gap: 12 (ref 5–15)
BUN: 11 mg/dL (ref 6–20)
CALCIUM: 8.6 mg/dL — AB (ref 8.9–10.3)
CHLORIDE: 102 mmol/L (ref 101–111)
CO2: 25 mmol/L (ref 22–32)
CREATININE: 0.81 mg/dL (ref 0.44–1.00)
GFR calc non Af Amer: >60 mL/min (ref >60)
GLUCOSE: 103 mg/dL — AB (ref 65–99)
Potassium: 3.4 mmol/L — ABNORMAL LOW (ref 3.5–5.1)
Sodium: 139 mmol/L (ref 135–145)

## 2016-12-07 MED ORDER — ENSURE ENLIVE PO LIQD
237.0000 mL | Freq: Two times a day (BID) | ORAL | Status: DC
  Administered 2016-12-07 – 2016-12-08 (×3): 237 mL via ORAL

## 2016-12-07 MED ORDER — LEVOFLOXACIN 500 MG PO TABS
500.0000 mg | ORAL_TABLET | Freq: Every day | ORAL | Status: AC
  Administered 2016-12-08: 500 mg via ORAL
  Filled 2016-12-07: qty 1

## 2016-12-07 MED ORDER — POTASSIUM CHLORIDE CRYS ER 20 MEQ PO TBCR
40.0000 meq | EXTENDED_RELEASE_TABLET | Freq: Once | ORAL | Status: AC
  Administered 2016-12-07: 40 meq via ORAL
  Filled 2016-12-07: qty 2

## 2016-12-07 MED ORDER — FUROSEMIDE 10 MG/ML IJ SOLN
40.0000 mg | Freq: Two times a day (BID) | INTRAMUSCULAR | Status: DC
  Administered 2016-12-07 – 2016-12-08 (×3): 40 mg via INTRAVENOUS
  Filled 2016-12-07 (×4): qty 4

## 2016-12-07 NOTE — Progress Notes (Signed)
Pt had nose bleed. Ice pack applied to nose, MD notified.

## 2016-12-07 NOTE — Progress Notes (Signed)
PROGRESS NOTE    NEL STONEKING  NTI:[id number] DOB: July 09, 1946 DOA: 12/01/2016 PCP: [name], MD     Brief Narrative:  [name] is a 71 y.o.femalewith medical history significant of hypertension, hyperlipidemia, diabetes mellitus, COPD, GERD, Torsade-de pointes, NSCLC 2010 (s/p of chemo and XTR, no surgery), stroke, diverticulitis, CAD, combined systolic and diastolic CHF with EF 86-76%, CKD-II, CAD, diverticular abscess with colonic fistular (has draining tube in place), who presents with cough, chest pain, fever, altered mental status, diarrhea.   Assessment & Plan:   Principal Problem:   Acute respiratory failure with hypoxia (HCC) Active Problems:   Uncontrolled type 2 diabetes mellitus with complication (HCC)   GERD (gastroesophageal reflux disease)   COPD exacerbation (HCC)   Sepsis ([place])   Hyperlipidemia   Colonic diverticular abscess   Hypertension associated with chronic kidney disease due to type 2 diabetes mellitus ([place])   Cerebrovascular accident (CVA) ([place])   CAP (community acquired pneumonia)   Essential hypertension   Acute encephalopathy   Chronic combined systolic and diastolic congestive heart failure ([place])   Diarrhea  Sepsis due to HCAP  - CXR showed infiltration in LML and LLL. PCCM was consulted and have evaluated patient, she is improving, signed off 2/10  - Influenza PCR negative, Streptococcus antigen negative, blood cultures negative  - Sputum culture pending  - Antibiotics deescalated to levaquin. Last day tx will be 2/15  Acute hypoxemic respiratory failure due to HCAP and COPD exacerbation and CHF  - Continue to wean O2 - Continue nebs, prednisone, antibiotics as above   Acute on chronic systolic CHF / recent history of Takatsubo cardiomyopathy - This was diagnosed in November 2017, 2-D echo at that time showed an ejection fraction of 35%, she was advised by cardiology and underwent cardiac catheterization which showed mild  nonobstructive coronary artery disease - Repeat 2-D echo now shows improving EF 40-45%  - Continue IV lasix BID   Recent diverticulitis with abscess / diarrhea - Still has drain in place, followed by Dr. 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 difficile is negative, GI PCR panel negative   DM-II - Last A1c 7.1 on 09/11/16, well controlled. Patient is taking70/30 insulin as a sliding scaleat home - SSI while in-house  GERD - Protonix  HTN - BP continues to be marginal. Will hold home meds for now   Hx of CVA - Continue lipitor and aspirin/plavix  Hypokalemia - Replace and trend   DVT prophylaxis: lovenox Code Status: full Family Communication:  No family at bedside Disposition Plan: pending further stabilization and improvement   Consultants:   PCCM  Procedures:   None  Antimicrobials:   Vancomycin 2/8 >> 2/12  Zosyn 2/8 >> 2/12  Levaquin 2/12 >> 2/15    Subjective: Main complaint is lasix and that it causes incontinence of urine. She has not been able to sleep at night due to constant urination. I have discussed with her that we will re-time lasix so she can receive PM dose earlier. No CP or worsening SOB   Objective: Vitals:   12/06/16 2207 12/07/16 0612 12/07/16 0918 12/07/16 1357  BP: (!) 99/58 (!) 96/47  (!) 112/48  Pulse: (!) 115 (!) 103  (!) 105  Resp: '18 17  16  '$ Temp: 97.6 F (36.4 C) 99 F (37.2 C)  97.4 F (36.3 C)  TempSrc: Oral Oral  Oral  SpO2: 91% 95% 96% 100%  Weight:  72.4 kg (159 lb 9.8 oz)    Height:        Intake/Output Summary (Last 24 hours) at 12/07/16 1519 Last data filed at 12/07/16 1030  Gross per 24 hour  Intake                0 ml  Output              300 ml  Net             -300 ml   Filed Weights   12/01/16 1930 12/02/16 0500 12/07/16 0612  Weight:  66.2 kg (146 lb) 73 kg (160 lb 15 oz) 72.4 kg (159 lb 9.8 oz)    Examination:  General exam: Appears calm and comfortable  Respiratory system: Clear to auscultation. Respiratory effort normal. Cardiovascular system: S1 & S2 heard, RRR. No JVD, murmurs, rubs, gallops or clicks. No pedal edema. Gastrointestinal system: Abdomen is nondistended, soft and nontender. No organomegaly or masses felt. Normal bowel sounds heard. Central nervous system: Alert and oriented. No focal neurological deficits. Extremities: Symmetric 5 x 5 power. Skin: No rashes, lesions or ulcers Psychiatry: Judgement and insight appear normal. Mood & affect appropriate.   Data Reviewed: I have personally reviewed following labs and imaging studies  CBC:  Recent Labs Lab 12/01/16 1946 12/03/16 0259 12/04/16 0536 12/05/16 0959 12/06/16 0659  WBC 11.2* 10.4 11.4* 15.4* 9.5  NEUTROABS 10.0*  --   --   --   --   HGB 10.9* 8.9* 8.9* 10.5* 9.4*  HCT 33.7* 27.9* 27.0* 32.7* 29.1*  MCV 87.8 86.9 86.5 88.1 88.2  PLT 164 154 186 236 102   Basic Metabolic Panel:  Recent Labs Lab 12/01/16 2204  12/03/16 0259 12/04/16 0536 12/05/16 0959 12/06/16 0659 12/07/16 0625  NA  --   < > 136 138 141 138 139  K  --   < > 3.2* 3.0* 3.9 4.0 3.4*  CL  --   < > 103 108 109 108 102  CO2  --   < > 24 22 20* 22 25  GLUCOSE  --   < > 186* 160* 108* 123* 103*  BUN  --   < > '10 13 11 9 11  '$ CREATININE  --   < > 0.77 0.80 0.89 0.85 0.81  CALCIUM  --   < > 7.9* 8.3* 8.9 8.5* 8.6*  MG 1.0*  --   --   --   --   --   --   < > = values in this interval not displayed. GFR: Estimated Creatinine Clearance: 61.9 mL/min (by C-G formula based on SCr of 0.81 mg/dL). Liver Function Tests:  Recent Labs Lab 12/01/16 1946 12/03/16 0259 12/06/16 0659  AST '20 16 20  '$ ALT 15 12* 15  ALKPHOS 93 79 63  BILITOT 0.8 0.5 0.5  PROT 5.8* 5.3* 5.8*  ALBUMIN 3.0* 2.3* 2.4*   No results for input(s): LIPASE, AMYLASE in the last 168 hours. No  results for input(s): AMMONIA in the last 168 hours. Coagulation Profile:  Recent Labs Lab 12/01/16 2204  INR 1.33   Cardiac Enzymes:  Recent Labs Lab 12/01/16 2204 12/02/16 0403 12/02/16 1015  TROPONINI 0.04* 0.04* 0.05*   BNP (last 3 results) No results for input(s): PROBNP in the last 8760 hours. HbA1C: No results for input(s): HGBA1C in the last 72 hours. CBG:  Recent Labs Lab 12/06/16 2023 12/07/16 0026 12/07/16 0403 12/07/16 0800 12/07/16 1132  GLUCAP 219* 155* 137* 82 205*   Lipid Profile: No results for input(s): CHOL, HDL, LDLCALC, TRIG, CHOLHDL, LDLDIRECT in the last 72 hours. Thyroid Function Tests: No results for input(s): TSH, T4TOTAL, FREET4, T3FREE, THYROIDAB in the last 72 hours. Anemia Panel: No results for input(s): VITAMINB12, FOLATE, FERRITIN, TIBC, IRON, RETICCTPCT in the last 72 hours. Sepsis Labs:  Recent Labs Lab 12/01/16 2001 12/01/16 2204 12/01/16 2217 12/04/16 0536 12/06/16 0659  PROCALCITON  --  5.05  --  7.37 1.86  LATICACIDVEN 2.46*  --  1.85  --   --     Recent Results (from the past 240 hour(s))  Blood Culture (routine x 2)     Status: None   Collection Time: 12/01/16  7:45 PM  Result Value Ref Range Status   Specimen Description BLOOD LEFT ANTECUBITAL  Final   Special Requests IN PEDIATRIC BOTTLE 3CC  Final   Culture NO GROWTH 5 DAYS  Final   Report Status 12/06/2016 FINAL  Final  Blood Culture (routine x 2)     Status: None   Collection Time: 12/01/16  9:15 PM  Result Value Ref Range Status   Specimen Description BLOOD RIGHT HAND  Final   Special Requests IN PEDIATRIC BOTTLE 3CC  Final   Culture NO GROWTH 5 DAYS  Final   Report Status 12/06/2016 FINAL  Final  C difficile quick scan w PCR reflex     Status: None   Collection Time: 12/02/16  1:17 AM  Result Value Ref Range Status   C Diff antigen NEGATIVE NEGATIVE Final   C Diff toxin NEGATIVE NEGATIVE Final   C Diff interpretation No C. difficile detected.  Final    Gastrointestinal Panel by PCR , Stool     Status: None   Collection Time: 12/02/16  1:17 AM  Result Value Ref Range Status   Campylobacter species NOT DETECTED NOT DETECTED Final   Plesimonas shigelloides NOT DETECTED NOT DETECTED Final   Salmonella species NOT DETECTED NOT DETECTED Final   Yersinia enterocolitica NOT DETECTED NOT DETECTED Final   Vibrio species NOT DETECTED NOT DETECTED Final   Vibrio cholerae NOT DETECTED NOT DETECTED Final   Enteroaggregative E coli (EAEC) NOT DETECTED NOT DETECTED Final   Enteropathogenic E coli (EPEC) NOT DETECTED NOT DETECTED Final   Enterotoxigenic E coli (ETEC) NOT DETECTED NOT DETECTED Final   Shiga like toxin producing E coli (STEC) NOT DETECTED NOT DETECTED Final   Shigella/Enteroinvasive E coli (EIEC) NOT DETECTED NOT DETECTED Final   Cryptosporidium NOT DETECTED NOT DETECTED Final   Cyclospora cayetanensis NOT DETECTED NOT DETECTED Final   Entamoeba histolytica NOT DETECTED NOT DETECTED Final   Giardia lamblia NOT DETECTED NOT DETECTED Final   Adenovirus F40/41 NOT DETECTED NOT DETECTED Final   Astrovirus NOT DETECTED NOT DETECTED Final   Norovirus GI/GII NOT DETECTED NOT DETECTED Final   Rotavirus A NOT DETECTED NOT DETECTED Final   Sapovirus (I, II, IV, and V) NOT DETECTED NOT DETECTED Final  MRSA PCR Screening     Status: None   Collection Time: 12/02/16  5:21 AM  Result Value Ref Range Status   MRSA by PCR NEGATIVE NEGATIVE Final    Comment:        The GeneXpert MRSA Assay (FDA approved for NASAL specimens only), is one component of a comprehensive MRSA colonization surveillance program. It is not intended to diagnose MRSA infection nor to guide  or monitor treatment for MRSA infections.   Aerobic/Anaerobic Culture (surgical/deep wound)     Status: None   Collection Time: 12/02/16 12:35 PM  Result Value Ref Range Status   Specimen Description ABDOMEN  Final   Special Requests NONE  Final   Gram Stain   Final    RARE WBC  PRESENT, PREDOMINANTLY PMN ABUNDANT GRAM POSITIVE COCCI IN PAIRS IN CHAINS ABUNDANT GRAM NEGATIVE RODS    Culture MULTIPLE ORGANISMS PRESENT, NONE PREDOMINANT  Final   Report Status 12/04/2016 FINAL  Final  Culture, sputum-assessment     Status: None   Collection Time: 12/05/16  9:59 AM  Result Value Ref Range Status   Specimen Description EXPECTORATED SPUTUM  Final   Special Requests NONE  Final   Sputum evaluation THIS SPECIMEN IS ACCEPTABLE FOR SPUTUM CULTURE  Final   Report Status 12/05/2016 FINAL  Final  Culture, respiratory (NON-Expectorated)     Status: None (Preliminary result)   Collection Time: 12/05/16  9:59 AM  Result Value Ref Range Status   Specimen Description EXPECTORATED SPUTUM  Final   Special Requests NONE Reflexed from [id number]  Final   Gram Stain   Final    MODERATE WBC PRESENT, PREDOMINANTLY PMN NO ORGANISMS SEEN    Culture CULTURE REINCUBATED FOR BETTER GROWTH  Final   Report Status PENDING  Incomplete       Radiology Studies: No results found.    Scheduled Meds: . arformoterol  15 mcg Nebulization BID  . aspirin  81 mg Oral Daily  . atorvastatin  80 mg Oral Daily  . budesonide (PULMICORT) nebulizer solution  0.5 mg Nebulization BID  . clopidogrel  75 mg Oral Daily  . enoxaparin (LOVENOX) injection  40 mg Subcutaneous Q24H  . feeding supplement (ENSURE ENLIVE)  237 mL Oral BID BM  . furosemide  40 mg Intravenous Q12H  . guaiFENesin  1,200 mg Oral BID  . insulin aspart  0-15 Units Subcutaneous Q4H  . [START ON 12/08/2016] levofloxacin  500 mg Oral Daily  . loratadine  10 mg Oral QHS  . mouth rinse  15 mL Mouth Rinse BID  . pantoprazole  40 mg Oral Q breakfast  . predniSONE  40 mg Oral Q breakfast   Continuous Infusions:   LOS: 6 days    Time spent: 40 minutes   Dessa Phi, DO [facility] [email or web address] Password [facility] 12/07/2016, 3:19 PM

## 2016-12-07 NOTE — Progress Notes (Signed)
Tried weaning pt to room air, but 02 went down to 83% in 10 minutes. Pt is back on 3 liters. MD notified. Will continue to observe.

## 2016-12-07 NOTE — Progress Notes (Signed)
Speech Language Pathology Treatment: Dysphagia  Patient Details Name: [name] MRN: [id number] DOB: 1946/07/15 Today's Date: 12/07/2016 Time: 0626-9485 SLP Time Calculation (min) (ACUTE ONLY): 8 min  Assessment / Plan / Recommendation Clinical Impression  [name] stated that she does not like that food texture that she's been consuming (texture too chopped). Observed pt with regular solids and thin liquids via cup with no overt s/s of airway compromise noted at bedside (slightly prolonged mastication). Informed pt that she could be safely upgraded from Dys 3 solids to regular solids. Educated pt re: the importance of small sips/bites and taking rest breaks when needed during mealtime if respiration becomes difficult. Pt agreed and stated that she will do so. Recommend diet upgrade to regular solids, thin liquids (straws ok), meds whole with liquids. Goals met and treatment no longer needed-ST signing off.    HPI HPI: [name] is a 71 y.o. female with PMH including NSCLC (2012, s/p chemo and XRT), CVA, COPD, GERD, mild cognitive impairment with memory loss. She was brought to [facility] ED 02/08 with cough, fever, AMS, diarrhea. She was found to have multifocal PNA and hypotension with SBP in low 80's.  She received 2.5L NS and SBP improved to mid 90's and occasionally in low 100's. She had admission 09/11/16 through 09/27/16 for sepsis due to diverticulitis with abscess formation and had drain placed via IR 09/12/16. Pt seen by SLP during prior admission s/p small left parietal CVA 08/2016. Was placed on dysphagia 3, nectar-thick liquids following coughing, changes in vital signs with thin liquids, was upgraded to dysphagia 3 with thin liquids prior to discharge, with follow-up in SNF recommended for swallowing. Notes indicated pt refused nectar-thick liquids. Chest x-ray 12/03/16 showed Airspace disease in the left lower lobe could reflect pneumonia.       SLP Plan  All goals met      Recommendations  Diet recommendations: Regular;Thin liquid Liquids provided via: Cup;Straw Medication Administration: Whole meds with liquid Supervision: Patient able to self feed;Intermittent supervision to cue for compensatory strategies Compensations: Slow rate;Small sips/bites;Other (Comment) (rest breaks if needed during mealtime) Postural Changes and/or Swallow Maneuvers: Seated upright 90 degrees                Oral Care Recommendations: Oral care BID Follow up Recommendations: None Plan: All goals met       GO                [name] , Student-SLP 12/07/2016, 10:29 AM

## 2016-12-07 NOTE — Progress Notes (Signed)
PHARMACIST - PHYSICIAN COMMUNICATION  CONCERNING: Antibiotic IV to Oral Route Change Policy  RECOMMENDATION: This patient is receiving Levaquin by the intravenous route.  Based on criteria approved by the Pharmacy and Therapeutics Committee, the antibiotic(s) is/are being converted to the equivalent oral dose form(s).   DESCRIPTION: These criteria include:  Patient being treated for a respiratory tract infection, urinary tract infection, cellulitis or clostridium difficile associated diarrhea if on metronidazole  The patient is not neutropenic and does not exhibit a GI malabsorption state  The patient is eating (either orally or via tube) and/or has been taking other orally administered medications for a least 24 hours  The patient is improving clinically and has a Tmax < 100.5  If you have questions about this conversion, please contact the Pharmacy Department  '[]'$   [phone] )  [facility] '[]'$   [phone] )  [facility] '[x]'$   [phone] )  [facility] '[]'$   [phone] )  [facility]                         '[]'$   [phone] )  [place], [place] D 12/07/2016 1:49 PM

## 2016-12-07 NOTE — Progress Notes (Signed)
Nutrition Follow-up  DOCUMENTATION CODES:   Not applicable  INTERVENTION:   -D/c Boost Breeze po TID, each supplement provides 250 kcal and 9 grams of protein -Ensure Enlive po BID, each supplement provides 350 kcal and 20 grams of protein  NUTRITION DIAGNOSIS:   Inadequate oral intake related to poor appetite as evidenced by meal completion < 50%, per patient/family report.  Progressing  GOAL:   Patient will meet greater than or equal to 90% of their needs  Progressing  MONITOR:   PO intake, Supplement acceptance, Labs, Weight trends, Skin, I & O's  REASON FOR ASSESSMENT:   Malnutrition Screening Tool    ASSESSMENT:   [name] is a 71 y.o. female with medical history significant of hypertension, hyperlipidemia, diabetes mellitus, COPD, GERD, Torsade-de pointes, NSCLC 2010 (s/p of chemo and XTR, no surgery), stroke, diverticulitis, CAD, combined systolic and diastolic CHF with EF 58-68%, CKD-II, CAD, diverticular abscess with colonic fistular (has draining tube in place), who presents with cough, chest pain, fever, altered mental status, diarrhea.  Spoke with pt at bedside, who reports ongoing poor appetite over the past 3 months. She states she typically consumes 3 meals per day, but eats about 50% less of what she is used to- she notes she usually only consumes a few bites of each food item.   She states her UBW is around 179# and has continually lost weight over the past 3-4 months. However, it appears as wt is trending up.   SLP signed off on pt today and upgraded her to a regular consistency diet. Per pt, she did not like the dysphagia diet. Suspect intake will improve with diet upgrade. Pt is very selective about food ("I only eat what I want to eat") and reports she will only eat soup for lunch. Discussed importance of consuming adequate nutrition to support healing. Pt is amenable to consuming Ensure supplements, but only chocolate flavor.   Nutrition-Focused  physical exam completed. Findings are mild fat depletion, no muscle depletion, and no edema.   Labs reviewed: K: 3.4, CBGS: 82-219.   Diet Order:  Diet Carb Modified Fluid consistency: Thin; Room service appropriate? Yes  Skin:  Reviewed, no issues  Last BM:  12/04/16  Height:   Ht Readings from Last 1 Encounters:  12/02/16 '5\' 2"'$  (1.575 m)    Weight:   Wt Readings from Last 1 Encounters:  12/07/16 159 lb 9.8 oz (72.4 kg)    Ideal Body Weight:  50 kg  BMI:  Body mass index is 29.19 kg/m.  Estimated Nutritional Needs:   Kcal:  1700-1900  Protein:  85-100 grams  Fluid:  1.7-1.9 L  EDUCATION NEEDS:   Education needs addressed  Ekansh Sherk A. Jimmye Norman, RD, LDN, CDE Pager: [phone] After hours Pager: [phone]

## 2016-12-08 ENCOUNTER — Other Ambulatory Visit (HOSPITAL_COMMUNITY): Payer: Self-pay

## 2016-12-08 LAB — BASIC METABOLIC PANEL
ANION GAP: 11 (ref 5–15)
BUN: 15 mg/dL (ref 6–20)
CALCIUM: 8.5 mg/dL — AB (ref 8.9–10.3)
CO2: 27 mmol/L (ref 22–32)
Chloride: 102 mmol/L (ref 101–111)
Creatinine, Ser: 0.79 mg/dL (ref 0.44–1.00)
GFR calc non Af Amer: >60 mL/min (ref >60)
Glucose, Bld: 110 mg/dL — ABNORMAL HIGH (ref 65–99)
Potassium: 3.8 mmol/L (ref 3.5–5.1)
Sodium: 140 mmol/L (ref 135–145)

## 2016-12-08 LAB — GLUCOSE, CAPILLARY
GLUCOSE-CAPILLARY: 116 mg/dL — AB (ref 65–99)
GLUCOSE-CAPILLARY: 126 mg/dL — AB (ref 65–99)
GLUCOSE-CAPILLARY: 78 mg/dL (ref 65–99)
Glucose-Capillary: 230 mg/dL — ABNORMAL HIGH (ref 65–99)
Glucose-Capillary: 234 mg/dL — ABNORMAL HIGH (ref 65–99)
Glucose-Capillary: 133 mg/dL — ABNORMAL HIGH (ref 65–99)

## 2016-12-08 LAB — CBC WITH DIFFERENTIAL/PLATELET
BASOS ABS: 0 10*3/uL (ref 0.0–0.1)
BASOS PCT: 0 %
Eosinophils Absolute: 0.1 10*3/uL (ref 0.0–0.7)
Eosinophils Relative: 1 %
HEMATOCRIT: 29.8 % — AB (ref 36.0–46.0)
HEMOGLOBIN: 9.3 g/dL — AB (ref 12.0–15.0)
Lymphocytes Relative: 16 %
Lymphs Abs: 1.1 10*3/uL (ref 0.7–4.0)
MCH: 27.6 pg (ref 26.0–34.0)
MCHC: 31.2 g/dL (ref 30.0–36.0)
MCV: 88.4 fL (ref 78.0–100.0)
Monocytes Absolute: 0.7 10*3/uL (ref 0.1–1.0)
Monocytes Relative: 10 %
NEUTROS ABS: 5 10*3/uL (ref 1.7–7.7)
NEUTROS PCT: 73 %
Platelets: 236 10*3/uL (ref 150–400)
RBC: 3.37 MIL/uL — AB (ref 3.87–5.11)
RDW: 15.8 % — ABNORMAL HIGH (ref 11.5–15.5)
WBC: 6.9 10*3/uL (ref 4.0–10.5)

## 2016-12-08 LAB — CULTURE, RESPIRATORY W GRAM STAIN

## 2016-12-08 LAB — CULTURE, RESPIRATORY

## 2016-12-08 NOTE — Consult Note (Signed)
   [facility] CM Inpatient Consult   12/08/2016  [name] Oct 08, 1946 [id number]   Follow up:  Came by to speak with the patient to check on progress and plan for disposition.  Chart review reveals from the physical therapist notes that the patient is for home with home health care.  Patient states, "please call me [name] because [name] is my mother-in-law."  Patient states her plan is to return home with her husband and family.  She states she feels like her lungs are getting better and she did not feel she needed rehab before returning home.  Will update her Lo[place], Kayleen Memos, of the patient's plan.  For questions, please contact:  Natividad Brood, RN BSN [facility] Liaison  [phone] business mobile phone Toll free office [phone]

## 2016-12-08 NOTE — Progress Notes (Signed)
Physical Therapy Treatment Patient Details Name: [name] MRN: [id number] DOB: 03/15/1946 Today's Date: 12/08/2016    History of Present Illness Patient is a 71 yo female admitted 12/01/16 with acute resp failure with hypoxia, sepsis, AMS.   PMH:  DM, COPD, HLD, colonic diverticular abscess with drain in place, HTN, CKD, CVA, CHF, arthritis, CAD, memory loss, diabetic retinopathy    PT Comments    Pt able to ambulate with no c/o of increased pain or SOB. At sink, pt performed dynamic standing balance with c/o of SOB; O2 sats dropped to 85% but quickly came back up between 91-95% with rest and instruction on pursed lip breathing. Continue to progress ambulation and activity tolerance while monitoring vitals to prepare pt for d/c home with HHPT.  Post treatment:  Communicated with RN about increase in O2 in room to 5l, used 6L for mobility; RN stated pt is supposed to be on 3L in room. RN going back to room to switch back to 3L.   Follow Up Recommendations  Home health PT;Supervision for mobility/OOB     Equipment Recommendations  Other (comment) (possible home O2?)    Recommendations for Other Services       Precautions / Restrictions Precautions Precautions: Fall Precaution Comments: Drain lower mid abdomen Restrictions Weight Bearing Restrictions: No    Mobility  Bed Mobility Overal bed mobility: Modified Independent             General bed mobility comments: pt requires increased time but no assistance required. Sats stayed above 90% with 5L of supplemental O2  Transfers Overall transfer level: Needs assistance Equipment used: Rolling walker (2 wheeled) Transfers: Sit to/from Stand Sit to Stand: Supervision         General transfer comment: Pt able to push up from EOB without external assitance. Cuing for hand placement and to scoot EOB  Ambulation/Gait Ambulation/Gait assistance: Min guard Ambulation Distance (Feet): 60 Feet Assistive device: Rolling  walker (2 wheeled) Gait Pattern/deviations: Step-through pattern;Decreased stride length;Drifts right/left     General Gait Details: Required cuing for pursed lip breathing. O2 sats remained above 96% on 6L supplemental O2 [place] but displayed labored breathing throughout activity. No rest breaks required; pt unwilling to walk futher due to fatigue   Stairs            Wheelchair Mobility    Modified Rankin (Stroke Patients Only)       Balance Overall balance assessment: Needs assistance   Sitting balance-Leahy Scale: Good       Standing balance-Leahy Scale: Fair Standing balance comment: Pt able to stand without UE support; intermittent UE support at sink for dynamic balance.                     Cognition Arousal/Alertness: Awake/alert Behavior During Therapy: WFL for tasks assessed/performed Overall Cognitive Status: Within Functional Limits for tasks assessed                      Exercises General Exercises - Lower Extremity Hip ABduction/ADduction: Both;10 reps;Standing Heel Raises: 10 reps;Standing Other Exercises Other Exercises: HS Curls x10 reps B in standing    General Comments        Pertinent Vitals/Pain Pain Assessment: No/denies pain    Home Living                      Prior Function            PT Goals (  current goals can now be found in the care plan section) Acute Rehab PT Goals Patient Stated Goal: none stated Potential to Achieve Goals: Good Progress towards PT goals: Progressing toward goals    Frequency    Min 3X/week      PT Plan Current plan remains appropriate    Co-evaluation             End of Session Equipment Utilized During Treatment: Gait belt;Oxygen Activity Tolerance: Patient tolerated treatment well;Patient limited by fatigue Patient left: in chair;with call bell/phone within reach;with chair alarm set     Time: 6770-3403 PT Time Calculation (min) (ACUTE ONLY): 27 min  Charges:   $Gait Training: 8-22 mins $Therapeutic Exercise: 8-22 mins                    G Codes:      [facility] 12/24/16, 3:31 PM Olena [place], [place] Pager [phone]

## 2016-12-08 NOTE — Progress Notes (Signed)
PROGRESS NOTE    [name]  ZOX:[id number] DOB: [birth year] DOA: 12/01/2016 PCP: [name], MD     Brief Narrative:  [name] is a 71 y.o.femalewith medical history significant of hypertension, hyperlipidemia, diabetes mellitus, COPD, GERD, Torsade-de pointes, NSCLC 2010 (s/p of chemo and XTR, no surgery), stroke, diverticulitis, CAD, combined systolic and diastolic CHF with EF 81-19%, CKD-II, CAD, diverticular abscess with colonic fistular (has draining tube in place), who presents with cough, chest pain, fever, altered mental status, diarrhea.   Assessment & Plan:   Principal Problem:   Acute respiratory failure with hypoxia (HCC) Active Problems:   Uncontrolled type 2 diabetes mellitus with complication (HCC)   GERD (gastroesophageal reflux disease)   COPD exacerbation (HCC)   Sepsis ([place])   Hyperlipidemia   Colonic diverticular abscess   Hypertension associated with chronic kidney disease due to type 2 diabetes mellitus ([place])   Cerebrovascular accident (CVA) ([place])   CAP (community acquired pneumonia)   Essential hypertension   Acute encephalopathy   Chronic combined systolic and diastolic congestive heart failure ([place])   Diarrhea  Sepsis due to HCAP  - CXR showed infiltration in LML and LLL. PCCM was consulted and have evaluated patient, she is improving, signed off 2/10  - Influenza PCR negative, Streptococcus antigen negative, blood cultures negative  - Sputum culture pending  - Completed antibiotic treatment   Acute hypoxemic respiratory failure due to HCAP and COPD exacerbation and CHF  - Continue to wean O2 - Continue nebs, prednisone, antibiotics as above   Acute on chronic systolic CHF / recent history of Takatsubo cardiomyopathy - This was diagnosed in November 2017, 2-D echo at that time showed an ejection fraction of 35%, she was advised by cardiology and underwent cardiac catheterization which showed mild nonobstructive coronary artery  disease - Repeat 2-D echo now shows improving EF 40-45%  - Continue IV lasix BID   Recent diverticulitis with abscess / diarrhea - Still has drain in place, followed by Dr. 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 difficile is negative, GI PCR panel negative   DM-II - Last A1c 7.1 on 09/11/16, well controlled. Patient is taking70/30 insulin as a sliding scaleat home - SSI while in-house  GERD - Protonix  HTN - BP continues to be marginal. Will hold home meds for now   Hx of CVA - Continue lipitor and aspirin/plavix  Hypokalemia - Replace and trend   DVT prophylaxis: lovenox Code Status: full Family Communication:  No family at bedside Disposition Plan: pending further stabilization and improvement   Consultants:   PCCM  Procedures:   None  Antimicrobials:   Vancomycin 2/8 >> 2/12  Zosyn 2/8 >> 2/12  Levaquin 2/12 >> 2/15    Subjective: No new symptoms today. Continues to require [place] O2. No new CP or worsening SOB    Objective: Vitals:   12/08/16 0508 12/08/16 0510 12/08/16 0916 12/08/16 1204  BP: (!) 105/49 135/82    Pulse: 85 78    Resp: 18 20    Temp: 98.8 F (37.1 C) 98.5 F (36.9 C)    TempSrc:  Oral    SpO2: 97% 100% (!) 87%   Weight:    64.6 kg (142 lb 8 oz)  Height:        Intake/Output Summary (Last 24 hours) at 12/08/16  [place] filed at 12/08/16 0900  Gross per 24 hour  Intake              240 ml  Output                0 ml  Net              240 ml   Filed Weights   12/07/16 0612 12/08/16 0500 12/08/16 1204  Weight: 72.4 kg (159 lb 9.8 oz) 58.5 kg (128 lb 15.5 oz) 64.6 kg (142 lb 8 oz)    Examination:  General exam: Appears calm and comfortable  Respiratory system: Clear to auscultation. Respiratory effort normal. Cardiovascular system: S1 & S2 heard, RRR. No  JVD, murmurs, rubs, gallops or clicks. No pedal edema. Gastrointestinal system: Abdomen is nondistended, soft and nontender. No organomegaly or masses felt. Normal bowel sounds heard. Central nervous system: Alert and oriented. No focal neurological deficits. Extremities: Symmetric 5 x 5 power. Skin: No rashes, lesions or ulcers Psychiatry: Judgement and insight appear normal. Mood & affect appropriate.   Data Reviewed: I have personally reviewed following labs and imaging studies  CBC:  Recent Labs Lab 12/01/16 1946 12/03/16 0259 12/04/16 0536 12/05/16 0959 12/06/16 0659 12/08/16 0426  WBC 11.2* 10.4 11.4* 15.4* 9.5 6.9  NEUTROABS 10.0*  --   --   --   --  5.0  HGB 10.9* 8.9* 8.9* 10.5* 9.4* 9.3*  HCT 33.7* 27.9* 27.0* 32.7* 29.1* 29.8*  MCV 87.8 86.9 86.5 88.1 88.2 88.4  PLT 164 154 186 236 192 627   Basic Metabolic Panel:  Recent Labs Lab 12/01/16 2204  12/04/16 0536 12/05/16 0959 12/06/16 0659 12/07/16 0625 12/08/16 0426  NA  --   < > 138 141 138 139 140  K  --   < > 3.0* 3.9 4.0 3.4* 3.8  CL  --   < > 108 109 108 102 102  CO2  --   < > 22 20* '22 25 27  '$ GLUCOSE  --   < > 160* 108* 123* 103* 110*  BUN  --   < > '13 11 9 11 15  '$ CREATININE  --   < > 0.80 0.89 0.85 0.81 0.79  CALCIUM  --   < > 8.3* 8.9 8.5* 8.6* 8.5*  MG 1.0*  --   --   --   --   --   --   < > = values in this interval not displayed. GFR: Estimated Creatinine Clearance: 59.4 mL/min (by C-G formula based on SCr of 0.79 mg/dL). Liver Function Tests:  Recent Labs Lab 12/01/16 1946 12/03/16 0259 12/06/16 0659  AST '20 16 20  '$ ALT 15 12* 15  ALKPHOS 93 79 63  BILITOT 0.8 0.5 0.5  PROT 5.8* 5.3* 5.8*  ALBUMIN 3.0* 2.3* 2.4*   No results for input(s): LIPASE, AMYLASE in the last 168 hours. No results for input(s): AMMONIA in the last 168 hours. Coagulation Profile:  Recent Labs Lab 12/01/16 2204  INR 1.33   Cardiac Enzymes:  Recent Labs Lab 12/01/16 2204 12/02/16 0403 12/02/16 1015    TROPONINI 0.04* 0.04* 0.05*   BNP (last 3 results) No results for input(s): PROBNP in the last 8760 hours. HbA1C: No results for input(s): HGBA1C in the last 72 hours. CBG:  Recent Labs Lab 12/07/16 2107 12/07/16 2343 12/08/16 0335 12/08/16 0808 12/08/16 1155  GLUCAP 148* 138* 126* 78 230*   Lipid Profile: No results for input(s): CHOL, HDL,  LDLCALC, TRIG, CHOLHDL, LDLDIRECT in the last 72 hours. Thyroid Function Tests: No results for input(s): TSH, T4TOTAL, FREET4, T3FREE, THYROIDAB in the last 72 hours. Anemia Panel: No results for input(s): VITAMINB12, FOLATE, FERRITIN, TIBC, IRON, RETICCTPCT in the last 72 hours. Sepsis Labs:  Recent Labs Lab 12/01/16 2001 12/01/16 2204 12/01/16 2217 12/04/16 0536 12/06/16 0659  PROCALCITON  --  5.05  --  7.37 1.86  LATICACIDVEN 2.46*  --  1.85  --   --     Recent Results (from the past 240 hour(s))  Blood Culture (routine x 2)     Status: None   Collection Time: 12/01/16  7:45 PM  Result Value Ref Range Status   Specimen Description BLOOD LEFT ANTECUBITAL  Final   Special Requests IN PEDIATRIC BOTTLE 3CC  Final   Culture NO GROWTH 5 DAYS  Final   Report Status 12/06/2016 FINAL  Final  Blood Culture (routine x 2)     Status: None   Collection Time: 12/01/16  9:15 PM  Result Value Ref Range Status   Specimen Description BLOOD RIGHT HAND  Final   Special Requests IN PEDIATRIC BOTTLE 3CC  Final   Culture NO GROWTH 5 DAYS  Final   Report Status 12/06/2016 FINAL  Final  C difficile quick scan w PCR reflex     Status: None   Collection Time: 12/02/16  1:17 AM  Result Value Ref Range Status   C Diff antigen NEGATIVE NEGATIVE Final   C Diff toxin NEGATIVE NEGATIVE Final   C Diff interpretation No C. difficile detected.  Final  Gastrointestinal Panel by PCR , Stool     Status: None   Collection Time: 12/02/16  1:17 AM  Result Value Ref Range Status   Campylobacter species NOT DETECTED NOT DETECTED Final   Plesimonas  shigelloides NOT DETECTED NOT DETECTED Final   Salmonella species NOT DETECTED NOT DETECTED Final   Yersinia enterocolitica NOT DETECTED NOT DETECTED Final   Vibrio species NOT DETECTED NOT DETECTED Final   Vibrio cholerae NOT DETECTED NOT DETECTED Final   Enteroaggregative E coli (EAEC) NOT DETECTED NOT DETECTED Final   Enteropathogenic E coli (EPEC) NOT DETECTED NOT DETECTED Final   Enterotoxigenic E coli (ETEC) NOT DETECTED NOT DETECTED Final   Shiga like toxin producing E coli (STEC) NOT DETECTED NOT DETECTED Final   Shigella/Enteroinvasive E coli (EIEC) NOT DETECTED NOT DETECTED Final   Cryptosporidium NOT DETECTED NOT DETECTED Final   Cyclospora cayetanensis NOT DETECTED NOT DETECTED Final   Entamoeba histolytica NOT DETECTED NOT DETECTED Final   Giardia lamblia NOT DETECTED NOT DETECTED Final   Adenovirus F40/41 NOT DETECTED NOT DETECTED Final   Astrovirus NOT DETECTED NOT DETECTED Final   Norovirus GI/GII NOT DETECTED NOT DETECTED Final   Rotavirus A NOT DETECTED NOT DETECTED Final   Sapovirus (I, II, IV, and V) NOT DETECTED NOT DETECTED Final  MRSA PCR Screening     Status: None   Collection Time: 12/02/16  5:21 AM  Result Value Ref Range Status   MRSA by PCR NEGATIVE NEGATIVE Final    Comment:        The GeneXpert MRSA Assay (FDA approved for NASAL specimens only), is one component of a comprehensive MRSA colonization surveillance program. It is not intended to diagnose MRSA infection nor to guide or monitor treatment for MRSA infections.   Aerobic/Anaerobic Culture (surgical/deep wound)     Status: None   Collection Time: 12/02/16 12:35 PM  Result Value Ref Range Status  Specimen Description ABDOMEN  Final   Special Requests NONE  Final   Gram Stain   Final    RARE WBC PRESENT, PREDOMINANTLY PMN ABUNDANT GRAM POSITIVE COCCI IN PAIRS IN CHAINS ABUNDANT GRAM NEGATIVE RODS    Culture MULTIPLE ORGANISMS PRESENT, NONE PREDOMINANT  Final   Report Status 12/04/2016  FINAL  Final  Culture, sputum-assessment     Status: None   Collection Time: 12/05/16  9:59 AM  Result Value Ref Range Status   Specimen Description EXPECTORATED SPUTUM  Final   Special Requests NONE  Final   Sputum evaluation THIS SPECIMEN IS ACCEPTABLE FOR SPUTUM CULTURE  Final   Report Status 12/05/2016 FINAL  Final  Culture, respiratory (NON-Expectorated)     Status: None (Preliminary result)   Collection Time: 12/05/16  9:59 AM  Result Value Ref Range Status   Specimen Description EXPECTORATED SPUTUM  Final   Special Requests NONE Reflexed from [id number]  Final   Gram Stain   Final    MODERATE WBC PRESENT, PREDOMINANTLY PMN NO ORGANISMS SEEN    Culture CULTURE REINCUBATED FOR BETTER GROWTH  Final   Report Status PENDING  Incomplete       Radiology Studies: No results found.    Scheduled Meds: . arformoterol  15 mcg Nebulization BID  . aspirin  81 mg Oral Daily  . atorvastatin  80 mg Oral Daily  . budesonide (PULMICORT) nebulizer solution  0.5 mg Nebulization BID  . clopidogrel  75 mg Oral Daily  . enoxaparin (LOVENOX) injection  40 mg Subcutaneous Q24H  . feeding supplement (ENSURE ENLIVE)  237 mL Oral BID BM  . furosemide  40 mg Intravenous BID  . guaiFENesin  1,200 mg Oral BID  . insulin aspart  0-15 Units Subcutaneous Q4H  . loratadine  10 mg Oral QHS  . mouth rinse  15 mL Mouth Rinse BID  . pantoprazole  40 mg Oral Q breakfast  . predniSONE  40 mg Oral Q breakfast   Continuous Infusions:   LOS: 7 days    Time spent: 30 minutes   Dessa Phi, DO [facility] [email or web address] Password [email or web address] 12/08/2016, 1:04 PM

## 2016-12-08 NOTE — Care Management Important Message (Signed)
Important Message  Patient Details  Name: [name] MRN: [id number] Date of Birth: 08-24-1946   Medicare Important Message Given:  Yes    Nathen May 12/08/2016, 12:01 PM

## 2016-12-09 ENCOUNTER — Other Ambulatory Visit (HOSPITAL_COMMUNITY): Payer: Self-pay

## 2016-12-09 ENCOUNTER — Inpatient Hospital Stay (HOSPITAL_COMMUNITY): Payer: Medicare Other

## 2016-12-09 LAB — CBC WITH DIFFERENTIAL/PLATELET
BASOS ABS: 0 10*3/uL (ref 0.0–0.1)
BASOS PCT: 0 %
EOS ABS: 0.3 10*3/uL (ref 0.0–0.7)
Eosinophils Relative: 3 %
HCT: 31.3 % — ABNORMAL LOW (ref 36.0–46.0)
Hemoglobin: 9.9 g/dL — ABNORMAL LOW (ref 12.0–15.0)
Lymphocytes Relative: 19 %
Lymphs Abs: 1.6 10*3/uL (ref 0.7–4.0)
MCH: 27.7 pg (ref 26.0–34.0)
MCHC: 31.6 g/dL (ref 30.0–36.0)
MCV: 87.7 fL (ref 78.0–100.0)
MONO ABS: 0.8 10*3/uL (ref 0.1–1.0)
Monocytes Relative: 9 %
Neutro Abs: 5.8 10*3/uL (ref 1.7–7.7)
Neutrophils Relative %: 69 %
PLATELETS: 231 10*3/uL (ref 150–400)
RBC: 3.57 MIL/uL — ABNORMAL LOW (ref 3.87–5.11)
RDW: 15.5 % (ref 11.5–15.5)
WBC: 8.5 10*3/uL (ref 4.0–10.5)

## 2016-12-09 LAB — BASIC METABOLIC PANEL
Anion gap: 10 (ref 5–15)
BUN: 19 mg/dL (ref 6–20)
CALCIUM: 8.6 mg/dL — AB (ref 8.9–10.3)
CO2: 27 mmol/L (ref 22–32)
CREATININE: 0.81 mg/dL (ref 0.44–1.00)
Chloride: 99 mmol/L — ABNORMAL LOW (ref 101–111)
GFR calc Af Amer: >60 mL/min (ref >60)
Glucose, Bld: 101 mg/dL — ABNORMAL HIGH (ref 65–99)
Potassium: 3.7 mmol/L (ref 3.5–5.1)
SODIUM: 136 mmol/L (ref 135–145)

## 2016-12-09 LAB — GLUCOSE, CAPILLARY
GLUCOSE-CAPILLARY: 109 mg/dL — AB (ref 65–99)
GLUCOSE-CAPILLARY: 82 mg/dL (ref 65–99)
Glucose-Capillary: 202 mg/dL — ABNORMAL HIGH (ref 65–99)

## 2016-12-09 MED ORDER — FUROSEMIDE 40 MG PO TABS
40.0000 mg | ORAL_TABLET | Freq: Every day | ORAL | Status: DC
  Administered 2016-12-09: 40 mg via ORAL
  Filled 2016-12-09: qty 1

## 2016-12-09 MED ORDER — FUROSEMIDE 40 MG PO TABS: 40.0000 mg | ORAL_TABLET | Freq: Every day | ORAL | 0 refills | Status: DC

## 2016-12-09 MED ORDER — POTASSIUM CHLORIDE ER 10 MEQ PO TBCR: 10.0000 meq | EXTENDED_RELEASE_TABLET | Freq: Every day | ORAL | 0 refills | Status: DC

## 2016-12-09 MED ORDER — PREDNISONE 10 MG PO TABS: ORAL_TABLET | ORAL | 0 refills | Status: DC

## 2016-12-09 NOTE — Discharge Summary (Addendum)
Physician Discharge Summary  [name] [facility]:[id number] DOB: 07-05-1946 DOA: 12/01/2016  PCP: Rachael Sou, MD  Admit date: 12/01/2016 Discharge date: 12/09/2016  Admitted From: Home Disposition:  Home  Recommendations for Outpatient Follow-up:  1. Follow up with PCP in 1 week 2. Consider repeat CXR or CT chest to further evaluate chronic right lung mass  3. Follow up with Cardiology as previously scheduled 4. Follow up with IR in 2 weeks for re-evaluation of drain   Home Health: PT  Equipment/Devices: home O2   Discharge Condition: Stable CODE STATUS: Full  Diet recommendation: Heart healthy, carb modified  Brief/Interim Summary: From H&P: [name] is a 71 y.o.femalewith medical history significant of hypertension, hyperlipidemia, diabetes mellitus, COPD, GERD, Torsade-de pointes, NSCLC 2010 (s/p of chemo and XTR, no surgery), stroke, diverticulitis, CAD, combined systolic and diastolic CHF with EF 20-25%, CKD-II, CAD, diverticular abscess with colonic fistular (has draining tube in place), who presents with cough, chest pain, fever, altered mental status, diarrhea.  Interim: She was admitted for right upper lobe infiltrate, acute respiratory failure, COPD, HCAP, systolic heart failure. PCCM has been involved during her hospitalization. She was treated with IV zosyn and vanco as well as steroids, nebs. Due to her diarrhea and abdominal pain, CT abd/pelvis was obtained without acute findings. Echo was also obtained due to her recent hx Takatsubo cardiomyopathy, which showed improvement in EF . She continued to make very slow improvements throughout hospitalization.  Subjective on day of discharge: Feeling about the same. No acute complaints. No CP SOB, minimal chronic cough, no nausea, vomiting, stool output about the same, no abdominal pain.   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 PCCM was consulted and have evaluated patient, she is improving, signed off 2/10  - Influenza PCR negative, Streptococcus antigen negative, blood cultures negative, sputum culture negative  - Completed antibiotic treatment   Acute hypoxemic respiratory failure due to HCAP and COPD exacerbation and CHF  - Continue to wean O2. Will need home O2 at time of discharge  - Continue nebs, prednisone. Will discharge with prednisone taper  - CXR repeated this morning, no acute change. Stable right peritracheal prominence should be further evaluated with CT as outpatient, although it may be her chronic baseline appearance   Acute on chronic systolic CHF / recent history of Takatsubo cardiomyopathy - This was diagnosed in November 2017, 2-D echo at that time showed an ejection fraction of 35%, she was advised by cardiology and underwent cardiac catheterization which showed mild nonobstructive coronary artery disease - Repeat 2-D echo now shows improving EF 40-45%  - Continue PO lasix at time of discharge   Recent diverticulitis with abscess / diarrhea - Still has drain in place, followed by [name] with general surgery and IR as an outpatient, 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 difficile is negative, GI PCR panel negative  - No further episodes of abdominal  pain or diarrhea   DM-II - Last A1c 7.1 on 09/11/16, well controlled. Patient is taking69/30 insulin as a sliding scaleat home  GERD - Protonix  HTN - Hold lisinopril and metoprolol at time of discharge due to marginal Bp. Follow up with PCP and cardiology   Hx of CVA - Continue lipitor and aspirin/plavix  Discharge Instructions  Discharge Instructions    (HEART FAILURE PATIENTS) Call MD:  Anytime you have any of the following symptoms: 1) 3 pound weight gain in 24 hours or 5 pounds in 1 week 2) shortness of breath, with or without a dry hacking cough 3) swelling in the hands, feet or stomach 4) if you have to sleep on extra pillows at night in order to breathe.    Complete by:  As directed    Call MD for:  difficulty breathing, headache or visual disturbances    Complete by:  As directed    Call MD for:  persistant dizziness or light-headedness    Complete by:  As directed    Call MD for:  temperature >100.4    Complete by:  As directed    Diet - low sodium heart healthy    Complete by:  As directed    Increase activity slowly    Complete by:  As directed      Allergies as of 12/09/2016      Reactions   Lactose Intolerance (gi) Diarrhea   Ibuprofen Other (See Comments)   REACTION: Anxiousness, hyperventilates      Medication List    STOP taking these medications   lisinopril 5 MG tablet Commonly known as:  PRINIVIL,ZESTRIL   Metoprolol Tartrate 37.5 MG Tabs     TAKE these medications   acetaminophen 500 MG tablet Commonly known as:  TYLENOL Take 500-1,000 mg by mouth every 6 (six) hours as needed for headache (or pain).   PROAIR HFA 108 (90 Base) MCG/ACT inhaler Generic drug:  albuterol Inhale 2 puffs into the lungs every 6 (six) hours as needed for wheezing or shortness of breath.   albuterol (2.5 MG/3ML) 0.083% nebulizer solution Commonly known as:  PROVENTIL USE ONE VIAL IN NEBULIZER EVERY 4 HOURS AS NEEDED FOR WHEEZING OR SHORTNESS OF BREATH    aspirin 81 MG chewable tablet Chew 81 mg by mouth daily.   atorvastatin 80 MG tablet Commonly known as:  LIPITOR TAKE ONE TABLET BY MOUTH ONCE DAILY What changed:  See the new instructions.   clopidogrel 75 MG tablet Commonly known as:  PLAVIX Take 1 tablet (75 mg total) by mouth daily.   FLOVENT HFA 220 MCG/ACT inhaler Generic drug:  fluticasone INHALE TWO PUFFS BY MOUTH TWICE DAILY   fluticasone 50 MCG/ACT nasal spray Commonly known as:  FLONASE Place 2 sprays into both nostrils at bedtime.   furosemide 40 MG tablet Commonly known as:  LASIX Take 1 tablet (40 mg total) by mouth daily.   HUMULIN 70/30 KWIKPEN (70-30) 100 UNIT/ML PEN Generic drug:  Insulin Isophane & Regular Human Per sliding scale, takes this qAM and aPM:  140-199 - 2 units, 200-250 - 4 units, 251-299 - 6 units,  300-349 - 8 units,  350 or above 10 units. Dispense syringes and needles as needed, Ok to switch to PEN if approved. Substitute to any brand approved. DX DM2, Code E11.65   loratadine 10 MG  tablet Commonly known as:  CLARITIN Take 10 mg by mouth at bedtime.   nitroGLYCERIN 0.4 MG SL tablet Commonly known as:  NITROSTAT Place 1 tablet (0.4 mg total) under the tongue every 5 (five) minutes as needed. For chest pain   pantoprazole 40 MG tablet Commonly known as:  PROTONIX TAKE ONE TABLET BY MOUTH ONCE DAILY What changed:  See the new instructions.   potassium chloride 10 MEQ tablet Commonly known as:  K-DUR Take 1 tablet (10 mEq total) by mouth daily.   predniSONE 10 MG tablet Commonly known as:  DELTASONE Take 4 tabs for 3 days, then 3 tabs for 3 days, then 2 tabs for 3 days, then 1 tab for 3 days, then 1/2 tab for 4 days.            Durable Medical Equipment        Start     Ordered   12/09/16 1152  DME Oxygen  Once    Question Answer Comment  Mode or (Route) Nasal cannula   Liters per Minute 2   Frequency Continuous (stationary and portable oxygen unit needed)   Oxygen  delivery system Gas      12/09/16 1151     Follow-up Information    MCGOWEN,PHILIP H, MD. Schedule an appointment as soon as possible for a visit in 1 week(s).   Specialty:  Family Medicine Contact information: [id number] [place] [address] [phone]        IR drain clinic Follow up in 2 week(s).          Allergies  Allergen Reactions  . Lactose Intolerance (Gi) Diarrhea  . Ibuprofen Other (See Comments)    REACTION: Anxiousness, hyperventilates    Consultations:  PCCM    Procedures/Studies: Dg Chest 2 View  Result Date: 12/09/2016 CLINICAL DATA:  Shortness of breath, respiratory failure. EXAM: CHEST  2 VIEW COMPARISON:  Radiographs of December 03, 2016. CT scan of September 22, 2016. FINDINGS: Stable right peritracheal prominence is noted which may represent radiation fibrosis, although recurrent malignancy cannot be excluded. No pneumothorax is noted. Mild right pleural effusion is noted. Atherosclerosis of thoracic aorta is noted. Bony thorax is unremarkable. Minimal left basilar subsegmental atelectasis is noted. IMPRESSION: Minimal left basilar subsegmental atelectasis. Mild right pleural effusion. Stable right peritracheal prominence is noted which may represent radiation fibrosis, although recurrent malignancy cannot be excluded. CT scan of the chest may be performed further evaluation. Aortic atherosclerosis. Electronically Signed   By: Marijo Conception, M.D.   On: 12/09/2016 09:56   Ct Abdomen Pelvis W Contrast  Result Date: 12/02/2016 CLINICAL DATA:  History of hypertension and diverticulitis with colonic fistula EXAM: CT ABDOMEN AND PELVIS WITH CONTRAST TECHNIQUE: Multidetector CT imaging of the abdomen and pelvis was performed using the standard protocol following bolus administration of intravenous contrast. CONTRAST:  169m ISOVUE-300 IOPAMIDOL (ISOVUE-300) INJECTION 61% COMPARISON:  10/06/2016 FINDINGS: Lower chest: New bilateral small pleural  effusions are noted as well as bilateral pneumonia. Mild pericardial effusion is noted. Hepatobiliary: No focal liver abnormality is seen. Status post cholecystectomy. No biliary dilatation. Pancreas: Unremarkable. No pancreatic ductal dilatation or surrounding inflammatory changes. Spleen: Normal in size without focal abnormality. Adrenals/Urinary Tract: Adrenal glands are unremarkable. Kidneys are normal, without renal calculi, focal lesion, or hydronephrosis. Bladder is unremarkable. Stomach/Bowel: Changes of diverticulosis are identified. Drainage catheter is noted adjacent to the sigmoid colon consistent with history of prior abscess. No residual cavity is seen. The appendix is  within normal limits. No obstructive changes are noted. Vascular/Lymphatic: Aortic atherosclerosis. No enlarged abdominal or pelvic lymph nodes. Reproductive: Status post hysterectomy. No adnexal masses. Other: No abdominal wall hernia or abnormality. No abdominopelvic ascites. Musculoskeletal: No acute or significant osseous findings. IMPRESSION: Bilateral pleural effusions and bibasilar pneumonia which are new from the prior exam. Diverticulosis without diverticulitis. A drainage catheter is noted in place without significant residual abscess cavity. Small pericardial effusion. Electronically Signed   By: Inez Catalina M.D.   On: 12/02/2016 15:23   Ir Sinus/fist Tube Chk-non Gi  Result Date: 12/05/2016 CLINICAL DATA:  History of diverticular abscess, post percutaneous drainage catheter placement initially on 09/12/2016 and replacement on 09/21/2016. CT scan of the abdomen and pelvis performed 10/06/2016 demonstrates resolution of the diverticular abscess however subsequent percutaneous drainage catheter injections performed 10/06/2016, 10/26/2016, 11/03/2016 and 11/24/2016 new all have demonstrated a persistent fistulous connection between the end of the percutaneous drainage catheter in the adjacent sigmoid colon. Patient presents  today for repeat fluoroscopic guided drainage catheter injection. The patient states she is intermittently flushing the percutaneous drain. EXAM: SINUS TRACT INJECTION/FISTULOGRAM COMPARISON:  CT scan abdomen and pelvis -09/11/2016; 10/06/2016; CT-guided percutaneous drainage catheter placement- 09/12/2016; 09/21/2016; fluoroscopic guided percutaneous drainage catheter injection - 10/06/2016; 10/26/2016; 11/03/2016; 11/24/2016 CONTRAST:  10 cc Isovue 300, administered via the existing percutaneous drainage catheter FLUOROSCOPY TIME:  12 seconds (1 mGy) TECHNIQUE: The patient was positioned supine on the fluoroscopy table. A preprocedural spot fluoroscopic image was obtained of the lower abdomen and pelvis and existing percutaneous drainage catheter Multiple spot fluoroscopic and radiographic images were obtained following the injection of a small amount of contrast via the existing percutaneous drainage catheter. Images reviewed in the procedure was terminated. The drainage catheter was flushed with a small amount of saline and reconnected to a gravity bag. FINDINGS: Preprocedural spot fluoroscopic image demonstrates unchanged positioning of the percutaneous drainage catheter with and coiled and locked over lying the midline of the lower pelvis Contrast injection demonstrates opacification of the decompressed abscess cavity however there is brisk communication between the end of the percutaneous drainage catheter and the adjacent sigmoid colon. 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 PLAN: - the patient was instructed to no longer flush the percutaneous drainage catheter. - the patient was instructed to maintain diligent records regarding drainage catheter output. - the patient will follow-up in the interventional radiology drain clinic in 2 weeks for repeat drainage catheter injection and evaluation and management  Electronically Signed   By: Sandi Mariscal M.D.   On: 12/05/2016 15:46   Dg Sinus/fist Tube Chk-non Gi  Result Date: 11/24/2016 INDICATION: Follow-up diverticular abscess.  Persistent colonic fistula. EXAM: ABSCESS INJECTION MEDICATIONS: None ANESTHESIA/SEDATION: None FLUOROSCOPY TIME:  1 minutes and 12 seconds, 21 mGy CONTRAST:  10 mL Omnipaque 175 COMPLICATIONS: None immediate. PROCEDURE: The pelvic drain was injected with contrast under fluoroscopy. Catheter was slightly pulled back and additional contrast was injected. Catheter was flushed with saline and secured to skin. FINDINGS: The pigtail catheter is in a stable position within the pelvis. Injection contrast demonstrates immediate filling of the adjacent sigmoid colon. There is no residual abscess collection around the drain. Some of the contrast drains around the drain towards the skin. Catheter was slightly pulled back and still remains adjacent to the sigmoid colon. IMPRESSION: Persistent colonic fistula.  No residual abscess collection. The drainage catheter was intentionally slightly pulled back and will continue drain flushing and  gravity drainage. Recommend surgical consultation. This drain has been present since 09/21/2016. Patient will return in 1-2 weeks for follow-up drain injection. Electronically Signed   By: Markus Daft M.D.   On: 11/24/2016 13:22   Dg Chest Port 1 View  Result Date: 12/03/2016 CLINICAL DATA:  Respiratory failure EXAM: PORTABLE CHEST 1 VIEW COMPARISON:  12/01/2016 FINDINGS: Area dense consolidation again noted in the medial right upper lobe, stable since prior study. Continued airspace disease in the left lower lung, slightly improved. Heart is normal size. No definite effusions. No acute bony abnormality. IMPRESSION: Airspace disease in the left lower lobe could reflect pneumonia. This is slightly improved since prior study. Stable right upper lobe paramediastinal airspace disease. This could reflect radiation change  although locally recurrent disease cannot be excluded. Electronically Signed   By: Rolm Baptise M.D.   On: 12/03/2016 14:53   Dg Chest Port 1 View  Result Date: 12/01/2016 CLINICAL DATA:  Patient with coarse breath sounds.  Confusion. EXAM: PORTABLE CHEST 1 VIEW COMPARISON:  Chest CT 09/22/2016; chest radiograph 10/11/2016. FINDINGS: Interval increase in right upper lobe paramediastinal consolidation. Interval development of consolidative opacities within the left mid lower lung. Stable cardiac and mediastinal contours. Probable small left pleural effusion. IMPRESSION: Interval development of consolidation within the left mid and lower lung which may represent pneumonia in the appropriate clinical setting. Interval increase in consolidation within the paramediastinal right lung. This may represent evolving postradiation change however locally recurrent disease is not excluded. After resolution of the acute symptomatology, recommend correlation with chest CT. Electronically Signed   By: Lovey Newcomer M.D.   On: 12/01/2016 21:10   Ir Radiologist Eval & Mgmt  Result Date: 11/24/2016 Please refer to "Notes" to see consult details.   Echo  Study Conclusions  - Left ventricle: The cavity size was normal. Systolic function was   mildly to moderately reduced. The estimated ejection fraction was   in the range of 40% to 45%. Wall motion was normal; there were no   regional wall motion abnormalities. - Aortic valve: Transvalvular velocity was minimally increased.   There was no stenosis. Mean gradient (S): 11 mm Hg. Valve area   (VTI): 1.41 cm^2. Valve area (Vmax): 1.54 cm^2. Valve area   (Vmean): 1.24 cm^2. - Aortic root: The aortic root was normal in size. - Mitral valve: There was no regurgitation. - Right ventricle: Systolic function was normal. - Right atrium: The atrium was normal in size. - Tricuspid valve: There was mild regurgitation. - Pulmonary arteries: Systolic pressure was mildly  increased. PA   peak pressure: 37 mm Hg (S). - Pericardium, extracardiac: A mild circumferential pericardial   effusion was identified. Features were not consistent with   tamponade physiology.  Impressions:  - When compared to te prior study from 09/14/2016 LVEF has improved   from 30-35% to 40-45% with akinesis of the mid anteroseptal and   apical septal walls.   There is mild circumferential pericardial effusion with no signs of tamponade.   Discharge Exam: Vitals:   12/08/16 2109 12/09/16 0354  BP: (!) 111/57 (!) 109/42  Pulse: 82 86  Resp: 18 18  Temp: 98.1 F (36.7 C) 98.3 F (36.8 C)   Vitals:   12/08/16 2008 12/08/16 2109 12/09/16 0000 12/09/16 0354  BP:  (!) 111/57  (!) 109/42  Pulse:  82  86  Resp:  18  18  Temp:  98.1 F (36.7 C)  98.3 F (36.8 C)  TempSrc:  Oral  Oral  SpO2: 98% 95%  97%  Weight:   67.2 kg (148 lb 2.4 oz)   Height:        General: Pt is alert, awake, not in acute distress Cardiovascular: RRR, S1/S2 +, no rubs, no gallops Respiratory: CTA bilaterally, no wheezing, no rhonchi, on [place] O2, no respiratory distress Abdominal: Soft, NT, ND, bowel sounds +, +drain in place  Extremities: no edema, no cyanosis    The results of significant diagnostics from this hospitalization (including imaging, microbiology, ancillary and laboratory) are listed below for reference.     Microbiology: Recent Results (from the past 240 hour(s))  Blood Culture (routine x 2)     Status: None   Collection Time: 12/01/16  7:45 PM  Result Value Ref Range Status   Specimen Description BLOOD LEFT ANTECUBITAL  Final   Special Requests IN PEDIATRIC BOTTLE 3CC  Final   Culture NO GROWTH 5 DAYS  Final   Report Status 12/06/2016 FINAL  Final  Blood Culture (routine x 2)     Status: None   Collection Time: 12/01/16  9:15 PM  Result Value Ref Range Status   Specimen Description BLOOD RIGHT HAND  Final   Special Requests IN PEDIATRIC BOTTLE 3CC  Final   Culture NO  GROWTH 5 DAYS  Final   Report Status 12/06/2016 FINAL  Final  C difficile quick scan w PCR reflex     Status: None   Collection Time: 12/02/16  1:17 AM  Result Value Ref Range Status   C Diff antigen NEGATIVE NEGATIVE Final   C Diff toxin NEGATIVE NEGATIVE Final   C Diff interpretation No C. difficile detected.  Final  Gastrointestinal Panel by PCR , Stool     Status: None   Collection Time: 12/02/16  1:17 AM  Result Value Ref Range Status   Campylobacter species NOT DETECTED NOT DETECTED Final   Plesimonas shigelloides NOT DETECTED NOT DETECTED Final   Salmonella species NOT DETECTED NOT DETECTED Final   Yersinia enterocolitica NOT DETECTED NOT DETECTED Final   Vibrio species NOT DETECTED NOT DETECTED Final   Vibrio cholerae NOT DETECTED NOT DETECTED Final   Enteroaggregative E coli (EAEC) NOT DETECTED NOT DETECTED Final   Enteropathogenic E coli (EPEC) NOT DETECTED NOT DETECTED Final   Enterotoxigenic E coli (ETEC) NOT DETECTED NOT DETECTED Final   Shiga like toxin producing E coli (STEC) NOT DETECTED NOT DETECTED Final   Shigella/Enteroinvasive E coli (EIEC) NOT DETECTED NOT DETECTED Final   Cryptosporidium NOT DETECTED NOT DETECTED Final   Cyclospora cayetanensis NOT DETECTED NOT DETECTED Final   Entamoeba histolytica NOT DETECTED NOT DETECTED Final   Giardia lamblia NOT DETECTED NOT DETECTED Final   Adenovirus F40/41 NOT DETECTED NOT DETECTED Final   Astrovirus NOT DETECTED NOT DETECTED Final   Norovirus GI/GII NOT DETECTED NOT DETECTED Final   Rotavirus A NOT DETECTED NOT DETECTED Final   Sapovirus (I, II, IV, and V) NOT DETECTED NOT DETECTED Final  MRSA PCR Screening     Status: None   Collection Time: 12/02/16  5:21 AM  Result Value Ref Range Status   MRSA by PCR NEGATIVE NEGATIVE Final    Comment:        The GeneXpert MRSA Assay (FDA approved for NASAL specimens only), is one component of a comprehensive MRSA colonization surveillance program. It is not intended to  diagnose MRSA infection nor to guide or monitor treatment for MRSA infections.   Aerobic/Anaerobic Culture (surgical/deep wound)  Status: None   Collection Time: 12/02/16 12:35 PM  Result Value Ref Range Status   Specimen Description ABDOMEN  Final   Special Requests NONE  Final   Gram Stain   Final    RARE WBC PRESENT, PREDOMINANTLY PMN ABUNDANT GRAM POSITIVE COCCI IN PAIRS IN CHAINS ABUNDANT GRAM NEGATIVE RODS    Culture MULTIPLE ORGANISMS PRESENT, NONE PREDOMINANT  Final   Report Status 12/04/2016 FINAL  Final  Culture, sputum-assessment     Status: None   Collection Time: 12/05/16  9:59 AM  Result Value Ref Range Status   Specimen Description EXPECTORATED SPUTUM  Final   Special Requests NONE  Final   Sputum evaluation THIS SPECIMEN IS ACCEPTABLE FOR SPUTUM CULTURE  Final   Report Status 12/05/2016 FINAL  Final  Culture, respiratory (NON-Expectorated)     Status: None   Collection Time: 12/05/16  9:59 AM  Result Value Ref Range Status   Specimen Description EXPECTORATED SPUTUM  Final   Special Requests NONE Reflexed from [id number]  Final   Gram Stain   Final    MODERATE WBC PRESENT, PREDOMINANTLY PMN NO ORGANISMS SEEN    Culture MULTIPLE ORGANISMS PRESENT, NONE PREDOMINANT  Final   Report Status 12/08/2016 FINAL  Final     Labs: BNP (last 3 results)  Recent Labs  12/01/16 2107  BNP 81.2   Basic Metabolic Panel:  Recent Labs Lab 12/05/16 0959 12/06/16 0659 12/07/16 0625 12/08/16 0426 12/09/16 0452  NA 141 138 139 140 136  K 3.9 4.0 3.4* 3.8 3.7  CL 109 108 102 102 99*  CO2 20* '22 25 27 27  '$ GLUCOSE 108* 123* 103* 110* 101*  BUN '11 9 11 15 19  '$ CREATININE 0.89 0.85 0.81 0.79 0.81  CALCIUM 8.9 8.5* 8.6* 8.5* 8.6*   Liver Function Tests:  Recent Labs Lab 12/03/16 0259 12/06/16 0659  AST 16 20  ALT 12* 15  ALKPHOS 79 63  BILITOT 0.5 0.5  PROT 5.3* 5.8*  ALBUMIN 2.3* 2.4*   No results for input(s): LIPASE, AMYLASE in the last 168 hours. No  results for input(s): AMMONIA in the last 168 hours. CBC:  Recent Labs Lab 12/04/16 0536 12/05/16 0959 12/06/16 0659 12/08/16 0426 12/09/16 0452  WBC 11.4* 15.4* 9.5 6.9 8.5  NEUTROABS  --   --   --  5.0 5.8  HGB 8.9* 10.5* 9.4* 9.3* 9.9*  HCT 27.0* 32.7* 29.1* 29.8* 31.3*  MCV 86.5 88.1 88.2 88.4 87.7  PLT 186 236 192 236 231   Cardiac Enzymes: No results for input(s): CKTOTAL, CKMB, CKMBINDEX, TROPONINI in the last 168 hours. BNP: Invalid input(s): POCBNP CBG:  Recent Labs Lab 12/08/16 2045 12/08/16 2354 12/09/16 0355 12/09/16 0736 12/09/16 1126  GLUCAP 133* 116* 109* 82 202*   D-Dimer No results for input(s): DDIMER in the last 72 hours. Hgb A1c No results for input(s): HGBA1C in the last 72 hours. Lipid Profile No results for input(s): CHOL, HDL, LDLCALC, TRIG, CHOLHDL, LDLDIRECT in the last 72 hours. Thyroid function studies No results for input(s): TSH, T4TOTAL, T3FREE, THYROIDAB in the last 72 hours.  Invalid input(s): FREET3 Anemia work up No results for input(s): VITAMINB12, FOLATE, FERRITIN, TIBC, IRON, RETICCTPCT in the last 72 hours. Urinalysis    Component Value Date/Time   COLORURINE YELLOW 12/02/2016 0239   APPEARANCEUR CLEAR 12/02/2016 0239   LABSPEC 1.016 12/02/2016 0239   PHURINE 5.0 12/02/2016 0239   GLUCOSEU 50 (A) 12/02/2016 0239   HGBUR NEGATIVE 12/02/2016 0239   BILIRUBINUR  NEGATIVE 12/02/2016 0239   KETONESUR NEGATIVE 12/02/2016 0239   PROTEINUR NEGATIVE 12/02/2016 0239   UROBILINOGEN 1.0 02/03/2013 0912   NITRITE NEGATIVE 12/02/2016 0239   LEUKOCYTESUR NEGATIVE 12/02/2016 0239   Sepsis Labs Invalid input(s): PROCALCITONIN,  WBC,  LACTICIDVEN Microbiology Recent Results (from the past 240 hour(s))  Blood Culture (routine x 2)     Status: None   Collection Time: 12/01/16  7:45 PM  Result Value Ref Range Status   Specimen Description BLOOD LEFT ANTECUBITAL  Final   Special Requests IN PEDIATRIC BOTTLE 3CC  Final   Culture NO  GROWTH 5 DAYS  Final   Report Status 12/06/2016 FINAL  Final  Blood Culture (routine x 2)     Status: None   Collection Time: 12/01/16  9:15 PM  Result Value Ref Range Status   Specimen Description BLOOD RIGHT HAND  Final   Special Requests IN PEDIATRIC BOTTLE 3CC  Final   Culture NO GROWTH 5 DAYS  Final   Report Status 12/06/2016 FINAL  Final  C difficile quick scan w PCR reflex     Status: None   Collection Time: 12/02/16  1:17 AM  Result Value Ref Range Status   C Diff antigen NEGATIVE NEGATIVE Final   C Diff toxin NEGATIVE NEGATIVE Final   C Diff interpretation No C. difficile detected.  Final  Gastrointestinal Panel by PCR , Stool     Status: None   Collection Time: 12/02/16  1:17 AM  Result Value Ref Range Status   Campylobacter species NOT DETECTED NOT DETECTED Final   Plesimonas shigelloides NOT DETECTED NOT DETECTED Final   Salmonella species NOT DETECTED NOT DETECTED Final   Yersinia enterocolitica NOT DETECTED NOT DETECTED Final   Vibrio species NOT DETECTED NOT DETECTED Final   Vibrio cholerae NOT DETECTED NOT DETECTED Final   Enteroaggregative E coli (EAEC) NOT DETECTED NOT DETECTED Final   Enteropathogenic E coli (EPEC) NOT DETECTED NOT DETECTED Final   Enterotoxigenic E coli (ETEC) NOT DETECTED NOT DETECTED Final   Shiga like toxin producing E coli (STEC) NOT DETECTED NOT DETECTED Final   Shigella/Enteroinvasive E coli (EIEC) NOT DETECTED NOT DETECTED Final   Cryptosporidium NOT DETECTED NOT DETECTED Final   Cyclospora cayetanensis NOT DETECTED NOT DETECTED Final   Entamoeba histolytica NOT DETECTED NOT DETECTED Final   Giardia lamblia NOT DETECTED NOT DETECTED Final   Adenovirus F40/41 NOT DETECTED NOT DETECTED Final   Astrovirus NOT DETECTED NOT DETECTED Final   Norovirus GI/GII NOT DETECTED NOT DETECTED Final   Rotavirus A NOT DETECTED NOT DETECTED Final   Sapovirus (I, II, IV, and V) NOT DETECTED NOT DETECTED Final  MRSA PCR Screening     Status: None    Collection Time: 12/02/16  5:21 AM  Result Value Ref Range Status   MRSA by PCR NEGATIVE NEGATIVE Final    Comment:        The GeneXpert MRSA Assay (FDA approved for NASAL specimens only), is one component of a comprehensive MRSA colonization surveillance program. It is not intended to diagnose MRSA infection nor to guide or monitor treatment for MRSA infections.   Aerobic/Anaerobic Culture (surgical/deep wound)     Status: None   Collection Time: 12/02/16 12:35 PM  Result Value Ref Range Status   Specimen Description ABDOMEN  Final   Special Requests NONE  Final   Gram Stain   Final    RARE WBC PRESENT, PREDOMINANTLY PMN ABUNDANT GRAM POSITIVE COCCI IN PAIRS IN CHAINS ABUNDANT GRAM NEGATIVE  RODS    Culture MULTIPLE ORGANISMS PRESENT, NONE PREDOMINANT  Final   Report Status 12/04/2016 FINAL  Final  Culture, sputum-assessment     Status: None   Collection Time: 12/05/16  9:59 AM  Result Value Ref Range Status   Specimen Description EXPECTORATED SPUTUM  Final   Special Requests NONE  Final   Sputum evaluation THIS SPECIMEN IS ACCEPTABLE FOR SPUTUM CULTURE  Final   Report Status 12/05/2016 FINAL  Final  Culture, respiratory (NON-Expectorated)     Status: None   Collection Time: 12/05/16  9:59 AM  Result Value Ref Range Status   Specimen Description EXPECTORATED SPUTUM  Final   Special Requests NONE Reflexed from [id number]  Final   Gram Stain   Final    MODERATE WBC PRESENT, PREDOMINANTLY PMN NO ORGANISMS SEEN    Culture MULTIPLE ORGANISMS PRESENT, NONE PREDOMINANT  Final   Report Status 12/08/2016 FINAL  Final     Time coordinating discharge: 40 minutes  SIGNED:  Dessa Phi, DO [facility] Pager [phone]  If 7PM-7AM, please contact night-coverage [email or web address] Password [facility] 12/09/2016, 11:53 AM

## 2016-12-10 DIAGNOSIS — H4010X0 Unspecified open-angle glaucoma, stage unspecified: Secondary | ICD-10-CM | POA: Diagnosis not present

## 2016-12-10 DIAGNOSIS — K5720 Diverticulitis of large intestine with perforation and abscess without bleeding: Secondary | ICD-10-CM | POA: Diagnosis not present

## 2016-12-10 DIAGNOSIS — Z4803 Encounter for change or removal of drains: Secondary | ICD-10-CM | POA: Diagnosis not present

## 2016-12-10 DIAGNOSIS — E11319 Type 2 diabetes mellitus with unspecified diabetic retinopathy without macular edema: Secondary | ICD-10-CM | POA: Diagnosis not present

## 2016-12-10 DIAGNOSIS — G3184 Mild cognitive impairment, so stated: Secondary | ICD-10-CM | POA: Diagnosis not present

## 2016-12-10 DIAGNOSIS — I119 Hypertensive heart disease without heart failure: Secondary | ICD-10-CM | POA: Diagnosis not present

## 2016-12-10 DIAGNOSIS — Z7982 Long term (current) use of aspirin: Secondary | ICD-10-CM | POA: Diagnosis not present

## 2016-12-10 DIAGNOSIS — I2510 Atherosclerotic heart disease of native coronary artery without angina pectoris: Secondary | ICD-10-CM | POA: Diagnosis not present

## 2016-12-10 DIAGNOSIS — I4581 Long QT syndrome: Secondary | ICD-10-CM | POA: Diagnosis not present

## 2016-12-10 DIAGNOSIS — Z7901 Long term (current) use of anticoagulants: Secondary | ICD-10-CM | POA: Diagnosis not present

## 2016-12-10 DIAGNOSIS — Z85118 Personal history of other malignant neoplasm of bronchus and lung: Secondary | ICD-10-CM | POA: Diagnosis not present

## 2016-12-10 DIAGNOSIS — Z7984 Long term (current) use of oral hypoglycemic drugs: Secondary | ICD-10-CM | POA: Diagnosis not present

## 2016-12-10 DIAGNOSIS — J449 Chronic obstructive pulmonary disease, unspecified: Secondary | ICD-10-CM | POA: Diagnosis not present

## 2016-12-12 ENCOUNTER — Telehealth: Payer: Self-pay | Admitting: Family Medicine

## 2016-12-12 ENCOUNTER — Encounter: Payer: Self-pay | Admitting: Family Medicine

## 2016-12-12 ENCOUNTER — Other Ambulatory Visit: Payer: Self-pay | Admitting: General Surgery

## 2016-12-12 ENCOUNTER — Other Ambulatory Visit: Payer: Self-pay

## 2016-12-12 DIAGNOSIS — K5720 Diverticulitis of large intestine with perforation and abscess without bleeding: Secondary | ICD-10-CM

## 2016-12-12 NOTE — Progress Notes (Signed)
This encounter was created in error - please disregard. This encounter was created in error - please disregard. This encounter was created in error - please disregard. 

## 2016-12-12 NOTE — Patient Outreach (Signed)
[facility]) Care Management  [place]  12/12/2016   [name] 1946/06/08 [id number]  Subjective: Member reports she is eating at this time and request RNCM speak with daughter.  Objective: none, telephonic call.  Encounter Medications:  Outpatient Encounter Prescriptions as of 12/12/2016  Medication Sig Note  . acetaminophen (TYLENOL) 500 MG tablet Take 500-1,000 mg by mouth every 6 (six) hours as needed for headache (or pain).    Marland Kitchen albuterol (PROAIR HFA) 108 (90 Base) MCG/ACT inhaler Inhale 2 puffs into the lungs every 6 (six) hours as needed for wheezing or shortness of breath.   Marland Kitchen albuterol (PROVENTIL) (2.5 MG/3ML) 0.083% nebulizer solution USE ONE VIAL IN NEBULIZER EVERY 4 HOURS AS NEEDED FOR WHEEZING OR SHORTNESS OF BREATH   . aspirin 81 MG chewable tablet Chew 81 mg by mouth daily.   Marland Kitchen atorvastatin (LIPITOR) 80 MG tablet TAKE ONE TABLET BY MOUTH ONCE DAILY (Patient taking differently: TAKE ONE TABLET BY MOUTH ONCE DAILY AT BEDTIME)   . cetirizine (ZYRTEC) 10 MG tablet Take 10 mg by mouth daily.   . clopidogrel (PLAVIX) 75 MG tablet Take 1 tablet (75 mg total) by mouth daily.   Marland Kitchen FLOVENT HFA 220 MCG/ACT inhaler INHALE TWO PUFFS BY MOUTH TWICE DAILY   . furosemide (LASIX) 40 MG tablet Take 1 tablet (40 mg total) by mouth daily.   Marland Kitchen HUMULIN 70/30 KWIKPEN (70-30) 100 UNIT/ML PEN Per sliding scale, takes this qAM and aPM:  140-199 - 2 units, 200-250 - 4 units, 251-299 - 6 units,  300-349 - 8 units,  350 or above 10 units. Dispense syringes and needles as needed, Ok to switch to PEN if approved. Substitute to any brand approved. DX DM2, Code E11.65 12/01/2016: Daughter verified the dosage (via phone)  . nitroGLYCERIN (NITROSTAT) 0.4 MG SL tablet Place 1 tablet (0.4 mg total) under the tongue every 5 (five) minutes as needed. For chest pain 12/01/2016: Patient carries this, but has never had to use any recently  . pantoprazole (PROTONIX) 40 MG tablet TAKE ONE  TABLET BY MOUTH ONCE DAILY (Patient taking differently: TAKE ONE TABLET BY MOUTH ONCE DAILY AT BEDTIME)   . potassium chloride (K-DUR) 10 MEQ tablet Take 1 tablet (10 mEq total) by mouth daily.   . predniSONE (DELTASONE) 10 MG tablet Take 4 tabs for 3 days, then 3 tabs for 3 days, then 2 tabs for 3 days, then 1 tab for 3 days, then 1/2 tab for 4 days.   . fluticasone (FLONASE) 50 MCG/ACT nasal spray Place 2 sprays into both nostrils at bedtime. (Patient not taking: Reported on 12/01/2016)   . loratadine (CLARITIN) 10 MG tablet Take 10 mg by mouth at bedtime.     No facility-administered encounter medications on file as of 12/12/2016.     Functional Status:  In your present state of health, do you have any difficulty performing the following activities: 12/02/2016 10/04/2016  Hearing? N N  Vision? N N  Difficulty concentrating or making decisions? N N  Walking or climbing stairs? Y N  Dressing or bathing? N N  Doing errands, shopping? N N  Preparing Food and eating ? - N  Using the Toilet? - N  In the past six months, have you accidently leaked urine? - N  Do you have problems with loss of bowel control? - N  Managing your Medications? - N  Managing your Finances? - N  Housekeeping or managing your Housekeeping? - Y  Some recent  data might be hidden    Fall/Depression Screening: PHQ 2/9 Scores 10/04/2016 07/05/2016 06/28/2016 04/06/2015 04/06/2015 10/04/2013  PHQ - 2 Score 1 0 0 0 0 0   Fall Risk  10/04/2016 07/05/2016 06/28/2016 04/06/2015 04/06/2015  Falls in the past year? Yes Yes Yes Yes Yes  Number falls in past yr: 2 or more 1 1 1 1   Injury with Fall? Yes No Yes No No  Risk Factor Category  High Fall Risk - High Fall Risk - -  Risk for fall due to : History of fall(s);Impaired balance/gait;Impaired mobility History of fall(s) History of fall(s) - -  Follow up Education provided;Falls prevention discussed Falls prevention discussed Falls evaluation completed Falls prevention discussed -     Assessment: 71 year old with recent admission 2/8-2/16 with sepsis due to HCAP. History of heart failure, lung cancer. RNCM called to complete transition of care call. Two patient identifiers confirmed. Client reports she is eating and request RNCM speak with her daughter, Deloris Royster. Ms. Trinna Balloon seems to be very knowledgable about client's care. She reports see assist client with drain care. No problems identified at this time. Ms. Trinna Balloon reports client is doing better.  Medications reviewed. Ms. Trinna Balloon reports that client is taking Metformin, however it is not listed on the medications discharge instruction sheet. Also Ms. Royster reports client is taking Cetirizine and not loratidine for her allergies. Client has an appointment with her primary care tomorrow. RNCM encouraged Ms. Royster to discuss this with client's primary care tomorrow. RNCM reinforced the importance of having an accurate medication list. Ms. Trinna Balloon verbalized understanding.  Home health has not contact client. However, Ms. Trinna Balloon reports she has the contact number and will contact [place] regarding a start of care date tomorrow.  No other issues noted at this time.  RNCM provided contact number and encouraged to call as needed. RNCM also reinforced 24 hour nurse advice line and encouraged to call as needed.  Plan: RNCM will complete home visit next week. [facility] CM Care Plan Problem One   Flowsheet Row Most Recent Value  Care Plan Problem One  Abdominal wound with drain.  Role Documenting the Problem One  Care Management [place] for Problem One  Active  [facility]N Long Term Goal (31-90 days)  Pt wound will be healed over the next 60 days.  THN Long Term Goal Start Date  10/27/16  Interventions for Problem One Long Term Goal  daughter reports she continues to manage drain with no problems noted.  THN CM Short Term Goal #1 (0-30 days)  Pt and family will learn wound care over the next 7 days  and report to me next week.  THN CM Short Term Goal #1 Start Date  10/27/16  THN CM Short Term Goal #1 Met Date  11/07/16  THN CM Short Term Goal #2 (0-30 days)  Pt and family will follow instructions from home health nurse on wound care and report at the  end of 30 days. [Pt and family will follow instructions from home health.]  THN CM Short Term Goal #2 Start Date  10/27/16  Interventions for Short Term Goal #2  drain managed by hospital staff while in hospital, daughter reports she is managing drain at this time with no issues, encouraged to call as needed, encouraged to follow up with home health regarding restart of care date if she has not heard from them by late tomorrow.    [facility] CM Care Plan Problem Two  Flowsheet Row Most Recent Value  Care Plan Problem Two  recent readmission  Role Documenting the Problem Two  Care Management Coordinator  Care Plan for Problem Two  Active  Interventions for Problem Two Long Term Goal   completed transition of care call, reinforced 24 hour nurse advice line and encouraged to call as needed. encouraged client/family to call RNCM as needed  THN Long Term Goal (31-90) days  Client will not be readmitted within the next  31 days.  THN Long Term Goal Start Date  12/12/16  THN CM Short Term Goal #1 (0-30 days)  client will attend office visit with primary care within the next week.  THN CM Short Term Goal #1 Start Date  12/12/16  THN CM Short Term Goal #2 (0-30 days)  daughter will verbalize contact with home health agency within the next week.  THN CM Short Term Goal #2 Start Date  12/12/16  Interventions for Short Term Goal #2  RNCM encouraged daughter to call tomorrow for start of care date, RNCM confirmed she has the contact number for [facility].       Thea Silversmith, RN, MSN, [place] Coordinator Cell: [phone]

## 2016-12-12 NOTE — Telephone Encounter (Signed)
Tammy from [facility] calling for orders to continue care for patient.  Thank you,  -LL

## 2016-12-13 ENCOUNTER — Ambulatory Visit (INDEPENDENT_AMBULATORY_CARE_PROVIDER_SITE_OTHER): Payer: Medicare Other | Admitting: Family Medicine

## 2016-12-13 ENCOUNTER — Ambulatory Visit: Payer: Self-pay | Admitting: Cardiology

## 2016-12-13 ENCOUNTER — Encounter: Payer: Self-pay | Admitting: Family Medicine

## 2016-12-13 VITALS — BP 114/62 | HR 87 | Temp 97.9°F | Resp 16 | Wt 141.2 lb

## 2016-12-13 DIAGNOSIS — E876 Hypokalemia: Secondary | ICD-10-CM

## 2016-12-13 DIAGNOSIS — G9340 Encephalopathy, unspecified: Secondary | ICD-10-CM

## 2016-12-13 DIAGNOSIS — J181 Lobar pneumonia, unspecified organism: Secondary | ICD-10-CM

## 2016-12-13 DIAGNOSIS — A419 Sepsis, unspecified organism: Secondary | ICD-10-CM | POA: Diagnosis not present

## 2016-12-13 DIAGNOSIS — IMO0001 Reserved for inherently not codable concepts without codable children: Secondary | ICD-10-CM

## 2016-12-13 DIAGNOSIS — Z789 Other specified health status: Secondary | ICD-10-CM

## 2016-12-13 DIAGNOSIS — J9601 Acute respiratory failure with hypoxia: Secondary | ICD-10-CM

## 2016-12-13 DIAGNOSIS — Z9189 Other specified personal risk factors, not elsewhere classified: Secondary | ICD-10-CM | POA: Diagnosis not present

## 2016-12-13 DIAGNOSIS — J189 Pneumonia, unspecified organism: Secondary | ICD-10-CM

## 2016-12-13 DIAGNOSIS — J441 Chronic obstructive pulmonary disease with (acute) exacerbation: Secondary | ICD-10-CM

## 2016-12-13 DIAGNOSIS — Z09 Encounter for follow-up examination after completed treatment for conditions other than malignant neoplasm: Secondary | ICD-10-CM

## 2016-12-13 DIAGNOSIS — R197 Diarrhea, unspecified: Secondary | ICD-10-CM

## 2016-12-13 NOTE — Progress Notes (Signed)
Pre visit review using our clinic review tool, if applicable. No additional management support is needed unless otherwise documented below in the visit note. 

## 2016-12-13 NOTE — Telephone Encounter (Signed)
Per Dr. Anitra Lauth okay to continue care for patient. Tammy advised and stated that she is going to continue PT as well as Home Health.

## 2016-12-13 NOTE — Patient Instructions (Signed)
You look good today.  Continue medications as directed.  Follow up in afew weeks at your scheduled appt and if needed Dr. Anitra Lauth will order repeat chest xray.    If you experience any vomiting, nausea, fever or difficulty breathing please be seen immediately.

## 2016-12-13 NOTE — Progress Notes (Signed)
Thank you so much for seeing this patient.

## 2016-12-13 NOTE — Progress Notes (Signed)
[name] , 07-20-1946, 71 y.o., female MRN: [id number] Patient Care Team    Relationship Specialty Notifications Start End  [name], MD PCP - General Family Medicine  04/15/14    Comment: Dr. Leanne Chang transfer (Merged)  Rigoberto Noel, MD Consulting Physician Pulmonary Disease  06/09/14   Calton Dach, MD Consulting Physician Optometry  08/05/14   Curt Bears, MD Consulting Physician Oncology  04/06/15   Hayden Pedro, MD Consulting Physician Ophthalmology  04/06/15   Almyra Deforest, Roslyn Consulting Physician Cardiology  11/21/16   Luretha Rued, Lydia Management   12/12/16     CC: [facility] hospital f/u Subjective:   [name] is a 71 y.o. female present for [facility] hospital follow up after admission for AMS, cough, chest pain, fever, nausea, diarrhea and vomit. She is a very poor historian and states she does not recall anything since November very well , which was a prior hospitalization. She states she does not recall being ill prior to this admission and her her husband found her in an altered state and called EMS. She was found to be hypotensive, lactate and WBC  Elevated, trop negative, electrolytes imbalanced, febrile, tachycardic, tachypnea with xray infiltrate of LML and LLL, ABG pH 7.28. Pt was admitted to SDU on 12/01/2016 for RUL infiltrate, acute resp failure, COPD, HCAP and CHF. She was started on IV Vanc, zosyn and Doxy, received steroid injection and albuterol treatments. Pt was discharged on 12/09/2016. During the course of her stay she had a CT abd without acute findings. She has an echo with improved EF from prior. She is with her younger sister today, and they report she is doing much better. She denies fever, fatigue, cough, shortness of breath. She reports she is eating and drinking well. Her drain from prior admission for diverticular abscess is doing well. Pt is unsure what medicines she is taking. She was told to hold metoprolol and  lisinopril at d/c. Her cxr 4 days ago at discharge resulted with residual changes and repeat CT was suggested for outpatient followup (changes possibly chronic from prior radiation).   Influenza negative Strep antigen negative Blood cultures negative Sputum culture negative  12/09/2016 CXR: IMPRESSION: Minimal left basilar subsegmental atelectasis. Mild right pleural effusion. Stable right peritracheal prominence is noted which may represent radiation fibrosis, although recurrent malignancy cannot be excluded. CT scan of the chest may be performed further evaluation. Aortic atherosclerosis. Allergies  Allergen Reactions  . Lactose Intolerance (Gi) Diarrhea  . Ibuprofen Other (See Comments)    REACTION: Anxiousness, hyperventilates   Social History  Substance Use Topics  . Smoking status: Former Smoker    Packs/day: 1.00    Years: 45.00    Types: Cigarettes    Quit date: 10/24/2008  . Smokeless tobacco: Never Used  . Alcohol use No   Past Medical History:  Diagnosis Date  . Age-related nuclear cataract of both eyes 2016   +cortical age related cataracts OU   . Allergy    seasonal  . Arthritis    hands  . Bilateral diabetic retinopathy (HCC) 2015   Dr. Zigmund Daniel  . Blood transfusion without reported diagnosis    when taking chemo  . CAD (coronary artery disease)    NON obstructive. Cath 2006 preserved LV fxn, scattered irregularities without critical stenosis. 2008 stress echo negative for ischemia, but with hypertensive response  . Chronic combined systolic and diastolic congestive heart failure ([place]) 12/01/2016  .  Chronic renal insufficiency, stage 2 (mild)    GFR 60s  . COPD (chronic obstructive pulmonary disease) ([place])   . CVA (cerebral vascular accident) ([place]) 09/2016   MRI did show a left-sided small ischemic stroke which was acute but likely incidental (MRI was done b/c pt had TIA sx's in hospital).  Neuro put her on plavix at that time.  . Diabetes mellitus with  complication (HCC)    diab retinopathy OU (laser)  . Diverticulitis 05/2016  . GERD (gastroesophageal reflux disease)    protonix  . History of diverticulitis of colon    with abscess; required IR percutaneous drain placed 09/2016.  Marland Kitchen Hyperlipidemia   . Hypertension   . Lung cancer ([place])    non-small cell lung ca, stage III in 05/2011; systemic chemotherapy concurrent with radiation followed by prophylactic cranial irradiation and has been observation since July of 2010 with no evidence for disease recurrence-released from onc f/u 12/2014 (needs annual cxr by PCP).  CXR 08/2015 stable.  . Mild cognitive impairment with memory loss    Likely from brain radiation therapy  . Open-angle glaucoma 2016   Dr. Shirley Muscat, (bilateral)---responding to topical therapy  . Osteopenia 03/2016   03/2016 DEXA T-score -2.2  . Pneumonia    hospitalization 11/2016; hypoxemic resp failure--d/c'd home with home oxygen therapy  . Torsades de pointes ([place]) 08/2016   avoid meds with potential for QT prolongation   Past Surgical History:  Procedure Laterality Date  . ABDOMINAL HYSTERECTOMY  1997  . APPENDECTOMY    . CARDIAC CATHETERIZATION N/A 09/14/2016   Procedure: Left Heart Cath and Coronary Angiography;  Surgeon: Troy Sine, MD;  Location: [place] CV LAB;  Service: Cardiovascular;  Laterality: N/A;  . CARDIAC CATHETERIZATION  09/14/2016   EF 25%, akinesis in a pattern of Takutsubos CM.  Marland Kitchen CARPAL TUNNEL RELEASE    . CATARACT EXTRACTION    . CHOLECYSTECTOMY  2002  . COLONOSCOPY  10/24/00   normal.  BioIQ hemoccult testing via [place] 06/18/15 was NEG  . IR GENERIC HISTORICAL  09/19/2016   IR SINUS/FIST TUBE CHK-NON GI 09/19/2016 Corrie Mckusick, DO [facility]  . IR GENERIC HISTORICAL  10/06/2016   IR RADIOLOGIST EVAL & MGMT 10/06/2016 [facility]  . IR GENERIC HISTORICAL  10/26/2016   IR RADIOLOGIST EVAL & MGMT 10/26/2016 [facility]  . IR GENERIC HISTORICAL  11/03/2016   IR RADIOLOGIST  EVAL & MGMT 11/03/2016 Ardis Rowan, PA-C [facility]  . IR GENERIC HISTORICAL  11/24/2016   IR RADIOLOGIST EVAL & MGMT 11/24/2016 Ardis Rowan, PA-C [facility]  . IR GENERIC HISTORICAL  12/05/2016   IR SINUS/FIST TUBE CHK-NON GI 12/05/2016 Sandi Mariscal, MD [facility]  . LEFT HEART CATHETERIZATION WITH CORONARY ANGIOGRAM N/A 05/30/2012   Procedure: LEFT HEART CATHETERIZATION WITH CORONARY ANGIOGRAM;  Surgeon: Burnell Blanks, MD;  Location: [facility] CATH LAB;  Service: Cardiovascular;  Laterality: N/A;  . TRANSTHORACIC ECHOCARDIOGRAM  09/14/2016   EF 30-35 %, Akinesis of the mid-apicalanteroseptal myocardium.  Grade I DD.  Mild pulm HTN.  Repeat echo ordered by cardiologist as of 11/23/16.   Family History  Problem Relation Age of Onset  . Heart failure Mother   . Kidney disease Mother     renal failure  . Diabetes Mother   . Diabetes Father   . Diabetes Brother   . Heart disease Brother   . Heart attack Brother   . Heart attack Brother   .  Heart attack Brother   . Colon cancer Neg Hx    Allergies as of 12/13/2016      Reactions   Lactose Intolerance (gi) Diarrhea   Ibuprofen Other (See Comments)   REACTION: Anxiousness, hyperventilates      Medication List       Accurate as of 12/13/16  1:51 PM. Always use your most recent med list.          acetaminophen 500 MG tablet Commonly known as:  TYLENOL Take 500-1,000 mg by mouth every 6 (six) hours as needed for headache (or pain).   PROAIR HFA 108 (90 Base) MCG/ACT inhaler Generic drug:  albuterol Inhale 2 puffs into the lungs every 6 (six) hours as needed for wheezing or shortness of breath.   albuterol (2.5 MG/3ML) 0.083% nebulizer solution Commonly known as:  PROVENTIL USE ONE VIAL IN NEBULIZER EVERY 4 HOURS AS NEEDED FOR WHEEZING OR SHORTNESS OF BREATH   aspirin 81 MG chewable tablet Chew 81 mg by mouth daily.   atorvastatin 80 MG tablet Commonly known as:  LIPITOR TAKE ONE TABLET BY MOUTH ONCE  DAILY   cetirizine 10 MG tablet Commonly known as:  ZYRTEC Take 10 mg by mouth daily.   clopidogrel 75 MG tablet Commonly known as:  PLAVIX Take 1 tablet (75 mg total) by mouth daily.   FLOVENT HFA 220 MCG/ACT inhaler Generic drug:  fluticasone INHALE TWO PUFFS BY MOUTH TWICE DAILY   fluticasone 50 MCG/ACT nasal spray Commonly known as:  FLONASE Place 2 sprays into both nostrils at bedtime.   furosemide 40 MG tablet Commonly known as:  LASIX Take 1 tablet (40 mg total) by mouth daily.   HUMULIN 70/30 KWIKPEN (70-30) 100 UNIT/ML PEN Generic drug:  Insulin Isophane & Regular Human Per sliding scale, takes this qAM and aPM:  140-199 - 2 units, 200-250 - 4 units, 251-299 - 6 units,  300-349 - 8 units,  350 or above 10 units. Dispense syringes and needles as needed, Ok to switch to PEN if approved. Substitute to any brand approved. DX DM2, Code E11.65   loratadine 10 MG tablet Commonly known as:  CLARITIN Take 10 mg by mouth at bedtime.   nitroGLYCERIN 0.4 MG SL tablet Commonly known as:  NITROSTAT Place 1 tablet (0.4 mg total) under the tongue every 5 (five) minutes as needed. For chest pain   pantoprazole 40 MG tablet Commonly known as:  PROTONIX TAKE ONE TABLET BY MOUTH ONCE DAILY   potassium chloride 10 MEQ tablet Commonly known as:  K-DUR Take 1 tablet (10 mEq total) by mouth daily.   predniSONE 10 MG tablet Commonly known as:  DELTASONE Take 4 tabs for 3 days, then 3 tabs for 3 days, then 2 tabs for 3 days, then 1 tab for 3 days, then 1/2 tab for 4 days.       No results found for this or any previous visit (from the past 24 hour(s)). No results found.   ROS: Negative, with the exception of above mentioned in HPI   Objective:  BP 114/62 (BP Location: Left Arm, Patient Position: Sitting, Cuff Size: Normal)   Pulse 87   Temp 97.9 F (36.6 C) (Oral)   Resp 16   Wt 141 lb 4 oz (64.1 kg)   SpO2 96%   BMI 25.83 kg/m  Body mass index is 25.83 kg/m. Gen:  Afebrile. No acute distress. Nontoxic in appearance, well developed, well nourished. Happy, pleasant caucasian female.  HENT: AT. [place].  MMM. No cough.  Eyes:Pupils Equal Round Reactive to light, Extraocular movements intact,  Conjunctiva without redness, discharge or icterus. Neck/lymp/endocrine: Supple,no lymphadenopathy CV: RRR, no edema Chest: CTAB, no wheeze or crackles. Good air movement, normal resp effort.  Abd: Soft. NTND. BS present. No rebound or guarding. Drain in place intact/dressing dry.  Skin: WWP. Intact.  Neuro:  Normal gait. PERLA. EOMi. Alert. Oriented x3  Psych: Normal affect, dress and demeanor. Normal speech. Normal thought content and judgment. Returned to baseline per family.   Assessment/Plan: [name] is a 71 y.o. female present for [place] for  Transition of care performed with sharing of clinical summary Hospital discharge follow-up COPD exacerbation ([place]) Acute respiratory failure with hypoxia ([place]) Acute encephalopathy Sepsis, due to unspecified organism ([place]) Hypokalemia Diarrhea, unspecified type Community acquired pneumonia of right upper lobe of lung ([place]) - pt seems to be doing well and at her baseline.  - I am still uncertain what she is taking as far as BP meds are concerned. She was asked to hold metoprolol and lisinopril (she has h/o cardiac arrest) until f/u with cardio/PCP. Her BP today is good and she is feeling improved.  - She was d/c'd 4 days ago with image on day of discharge, would wait a few weeks before repeat imaging. She has an appt at that time with PCP and he can review and order if felt needed.  - she has returned to normal activities and eating. Electrolytes normal at time of discharge. Do not feel it necessary to repeat today with her improvements.  - DC summary mentions home O2, pt denies home O2 use and oxygen sats in office are normal. - continue F/U with cards.  - F/U with PCP as scheduled in a few weeks.     electronically signed by:  Howard Pouch, DO  [place]

## 2016-12-15 ENCOUNTER — Encounter: Payer: Self-pay | Admitting: Nurse Practitioner

## 2016-12-15 ENCOUNTER — Ambulatory Visit (INDEPENDENT_AMBULATORY_CARE_PROVIDER_SITE_OTHER): Payer: Medicare Other | Admitting: Nurse Practitioner

## 2016-12-15 VITALS — BP 107/65 | HR 108 | Ht 62.0 in | Wt 141.8 lb

## 2016-12-15 DIAGNOSIS — I5181 Takotsubo syndrome: Secondary | ICD-10-CM

## 2016-12-15 DIAGNOSIS — I5042 Chronic combined systolic (congestive) and diastolic (congestive) heart failure: Secondary | ICD-10-CM

## 2016-12-15 NOTE — Progress Notes (Signed)
Office Visit    Patient Name: [name] Date of Encounter: 12/15/2016  Primary Care Provider:  [name], MD Primary Cardiologist:  Roni Bread, MD   Chief Complaint    71 y/o ? with a h/o nonobs CAD, HTN, HL, DM II, Copd, non-small cell lung CA s/p chemo/radiation, diverticulitis, and takotsubo CM, who presents for f/u after recent hospitalization for pna/sepsis.  Past Medical History    Past Medical History:  Diagnosis Date  . Age-related nuclear cataract of both eyes 2016   +cortical age related cataracts OU   . Allergy    seasonal  . Arthritis    hands  . Bilateral diabetic retinopathy (HCC) 2015   Dr. Zigmund Daniel  . Blood transfusion without reported diagnosis    when taking chemo  . Chronic combined systolic and diastolic congestive heart failure ([place])    a. 08/2016 Echo: EF 30-35%, gr1DD, PASP 61mHg;  b. 11/2016 Echo: EF 40-45%, no rwma, nl RV fxn, mild TR, PASP 343mg.  . CKD (chronic kidney disease), stage II    GFR 60s  . COPD (ch[place] obstructive pulmonary disease) [place]  . CVA (cerebral vascular accident) ([place]   MRI did show a left-sided small ischemic stroke which was acute but likely incidental (MRI was done b/c pt had TIA sx's in hospital).  Neuro put her on plavix at that time.  . Diabetes mellitus with complication (HCC)    diab retinopathy OU (laser)  . Diverticulitis 05/2016  . GERD (gastroesophageal reflux disease)    protonix  . History of diverticulitis of colon    with abscess; required IR percutaneous drain placed 09/2016.  . Marland Kitchenyperlipidemia   . Hypertension   . Lung cancer ([place]   non-small cell lung ca, stage III in 05/2011; systemic chemotherapy concurrent with radiation followed by prophylactic cranial irradiation and has been observation since July of 2010 with no evidence for disease recurrence-released from onc f/u 12/2014 (needs annual cxr by PCP).  CXR 08/2015 stable.  . Mild cognitive impairment with memory loss    Likely from brain radiation therapy  . Non-obstructive CAD (coronary artery disease)    a. Cath 2006 preserved LV fxn, scattered irregularities without critical stenosis; b. 2008 stress echo negative for ischemia, but with hypertensive response; c. 08/2016 Cath: D1 25%, otw nl.  . Open-angle glaucoma 2016   Dr. BeShirley Muscat(bilateral)---responding to topical therapy  . Osteopenia 03/2016   03/2016 DEXA T-score -2.2  . Pneumonia    hospitalization 11/2016; hypoxemic resp failure--d/c'd home with home oxygen therapy  . Takotsubo cardiomyopathy    a. 08/2016 Echo: EF 30-35%, gr1DD, PASP 3816m;  b. 08/2016 Cath: nonobs Dzs;  c. 11/2016 Echo: EF 40-45%, no rwma, nl RV fxn, mild TR, PASP 52m54m  . Torsades de pointes (HCC)[place]/2017   a. 08/2016 in setting of diverticulitis & pneumoperitoneum and Takotsubo CM -->prolonged QT, seen by EP-->avoid meds with potential for QT prolongation.   Past Surgical History:  Procedure Laterality Date  . ABDOMINAL HYSTERECTOMY  1997  . APPENDECTOMY    . CARDIAC CATHETERIZATION N/A 09/14/2016   Procedure: Left Heart Cath and Coronary Angiography;  Surgeon: ThomTroy Sine;  [facility];  Service: Cardiovascular;  Laterality: N/A;  . CARDIAC CATHETERIZATION  09/14/2016   EF 25%, akinesis in a pattern of Takutsubos CM.  . CAMarland KitchenPAL TUNNEL RELEASE    . CATARACT EXTRACTION    . CHOLECYSTECTOMY  2002  . COLONOSCOPY  10/24/00  normal.  [employer]IQ hemoccult testing via [place] 06/18/15 was NEG  . IR GENERIC HISTORICAL  09/19/2016   IR SINUS/FIST TUBE CHK-NON GI 09/19/2016 Corrie Mckusick, DO [facility]  . IR GENERIC HISTORICAL  10/06/2016   IR RADIOLOGIST EVAL & MGMT 10/06/2016 [facility]  . IR GENERIC HISTORICAL  10/26/2016   IR RADIOLOGIST EVAL & MGMT 10/26/2016 [facility]  . IR GENERIC HISTORICAL  11/03/2016   IR RADIOLOGIST EVAL & MGMT 11/03/2016 Ardis Rowan, PA-C [facility]  . IR GENERIC HISTORICAL  11/24/2016   IR RADIOLOGIST  EVAL & MGMT 11/24/2016 Ardis Rowan, PA-C [facility]  . IR GENERIC HISTORICAL  12/05/2016   IR SINUS/FIST TUBE CHK-NON GI 12/05/2016 Sandi Mariscal, MD [facility]  . LEFT HEART CATHETERIZATION WITH CORONARY ANGIOGRAM N/A 05/30/2012   Procedure: LEFT HEART CATHETERIZATION WITH CORONARY ANGIOGRAM;  Surgeon: Burnell Blanks, MD;  [facility] C[facility] LAB;  Service: Cardiovascular;  Laterality: N/A;  . TRANSTHORACIC ECHOCARDIOGRAM  09/14/2016   EF 30-35 %, Akinesis of the mid-apicalanteroseptal myocardium.  Grade I DD.  Mild pulm HTN.  Repeat echo ordered by cardiologist as of 11/23/16.    Allergies  Allergies  Allergen Reactions  . Lactose Intolerance (Gi) Diarrhea  . Ibuprofen Other (See Comments)    REACTION: Anxiousness, hyperventilates    History of Present Illness    71 y/o ? with the above complex PMH.  Briefly, she was admitted in 08/2016 with diverticulitis and pneumoperitoneum/abscess.  She was tx with abx and drain placmement.  She later developed increased abd pain and VT/Torsades arrest req CPR and defib x 2.  Echo showed EF 30-35%.  Cath showed nonobs dzs.  This was felt to represent a takotsubo CM.  She was seen by EP.  QT was borderline and rec was made to avoid QT prolonging meds.  There was discussion re: Lifevest but was never placed.  She f/u in clinic 1/31 and was feeling some better.  She had been on metoprolol and lisinopril @ the time.  Unfortunately, she required readmission on 2/8 secondary to fever, cough, AMS, diarrhea, and RUL PNA with concern for sepsis.  She was tx with IV abx.  Echo was performed and showed some improvement in EF, now up to 40-45%.  Following recovery, she was d/c'd home on 2/16.  Since d/c, her strength is slowly recovering.  She has not been having any chest pain.  She has some degree of chronic DOE with wheezing.  She has not been weighing herself @ home.  She denies palpitations, pnd, orthopnea, n, v, dizziness, syncope, edema, weight  gain, or early satiety.   Home Medications    Prior to Admission medications   Medication Sig Start Date End Date Taking? Authorizing Provider  acetaminophen (TYLENOL) 500 MG tablet Take 500-1,000 mg by mouth every 6 (six) hours as needed for headache (or pain).    Yes Historical Provider, MD  albuterol (PROAIR HFA) 108 (90 Base) MCG/ACT inhaler Inhale 2 puffs into the lungs every 6 (six) hours as needed for wheezing or shortness of breath.   Yes Historical Provider, MD  albuterol (PROVENTIL) (2.5 MG/3ML) 0.083% nebulizer solution USE ONE VIAL IN NEBULIZER EVERY 4 HOURS AS NEEDED FOR WHEEZING OR SHORTNESS OF BREATH 12/16/15  Yes [name], MD  aspirin 81 MG chewable tablet Chew 81 mg by mouth daily.   Yes Historical Provider, MD  atorvastatin (LIPITOR) 80 MG tablet TAKE ONE TABLET BY MOUTH ONCE DAILY  Patient taking differently: TAKE ONE TABLET BY MOUTH ONCE DAILY AT BEDTIME 07/18/16  Yes [name], MD  cetirizine (ZYRTEC) 10 MG tablet Take 10 mg by mouth daily.   Yes Historical Provider, MD  clopidogrel (PLAVIX) 75 MG tablet Take 1 tablet (75 mg total) by mouth daily. 11/21/16  Yes [name], MD  FLOVENT HFA 220 MCG/ACT inhaler INHALE TWO PUFFS BY MOUTH TWICE DAILY 01/12/16  Yes [name], MD  fluticasone (FLONASE) 50 MCG/ACT nasal spray Place 2 sprays into both nostrils at bedtime. 06/25/14  Yes [name], MD  furosemide (LASIX) 40 MG tablet Take 1 tablet (40 mg total) by mouth daily. 12/09/16  Yes Jennifer Chahn-Yang Choi, DO  HUMULIN 70/30 KWIKPEN (70-30) 100 UNIT/ML PEN Per sliding scale, takes this qAM and aPM:  140-199 - 2 units, 200-250 - 4 units, 251-299 - 6 units,  300-349 - 8 units,  350 or above 10 units. Dispense syringes and needles as needed, Ok to switch to PEN if approved. Substitute to any brand approved. DX DM2, Code E11.65 09/05/16  Yes Historical Provider, MD  loratadine (CLARITIN) 10 MG tablet Take 10 mg by mouth at bedtime.    Yes Historical  Provider, MD  nitroGLYCERIN (NITROSTAT) 0.4 MG SL tablet Place 1 tablet (0.4 mg total) under the tongue every 5 (five) minutes as needed. For chest pain 12/31/12  Yes Lisabeth Pick, MD  pantoprazole (PROTONIX) 40 MG tablet TAKE ONE TABLET BY MOUTH ONCE DAILY Patient taking differently: TAKE ONE TABLET BY MOUTH ONCE DAILY AT BEDTIME 10/15/15  Yes [name], MD  potassium chloride (K-DUR) 10 MEQ tablet Take 1 tablet (10 mEq total) by mouth daily. 12/09/16  Yes Jennifer Chahn-Yang Choi, DO  predniSONE (DELTASONE) 10 MG tablet Take 4 tabs for 3 days, then 3 tabs for 3 days, then 2 tabs for 3 days, then 1 tab for 3 days, then 1/2 tab for 4 days. 12/09/16  Yes Shon Millet, DO    Review of Systems    Strength steadily improving.  Chronic mild dyspnea with occas wheezing - uses inhalers.  She denies chest pain, palpitations, pnd, orthopnea, n, v, dizziness, syncope, edema, weight gain, or early satiety.  All other systems reviewed and are otherwise negative except as noted above.  Physical Exam    VS:  BP 107/65   Pulse (!) 108   Ht '5\' 2"'$  (1.575 m)   Wt 141 lb 12.8 oz (64.3 kg)   SpO2 95%   BMI 25.94 kg/m  , BMI Body mass index is 25.94 kg/m. GEN: Well nourished, well developed, in no acute distress.  HEENT: normal.  Neck: Supple, no JVD, carotid bruits, or masses. Cardiac: RRR, no murmurs, rubs, or gallops. No clubbing, cyanosis, edema.  Radials/DP/PT 2+ and equal bilaterally.  Respiratory:  Respirations regular and unlabored, scattered rhonchi with faint exp wheezing. GI: Soft, nontender, nondistended, BS + x 4. R sided drain remains in place. MS: no deformity or atrophy. Skin: warm and dry, no rash. Neuro:  Strength and sensation are intact. Psych: Normal affect.  Accessory Clinical Findings    ECG - RSR, 96, LBBB, no acute changes.  Assessment & Plan    1.  Takotsubo CM/Chronic combined syst/diast CHF:  Pt w/ LV dysfxn noted on echo in 08/2016 following  VT/Torsades arrest in the setting of prolonged QT and diverticulitis/pneumoperitoneum/abdominal abscess.  Cath showed nonobs dzs.  She was recently rehospitalized with pna and sepsis.  F/u echo showed  EF of 40-45%.  She is euvolemic on exam.  She does not weigh herself @ home.  We discussed the importance of daily weights, sodium restriction, medication compliance, and symptom reporting and she verbalizes understanding.  With recent sepsis and soft bp's during hospitalization, she was taken off of  blocker and acei.  BP today is only 107/65.  In that setting, I will hold off on resuming  blocker @ this time.  We will look to resume this when she sees D. Ellyn Hack, MD next month.  2.  Recent PNA:  Slowly recovering.  Off of abx.  Cont inhlaer therapy.  3.  Diverticulitis complicated by abdominal abscess:  Drain remains in place.  She has f/u with IR planned.  4.  HL:  Remains on lipitor.  5.  DM II:  Insulin per IM.  6.  Dispo: f/u with Dr. Ellyn Hack next month as planned.   [name], NP 12/15/2016, 10:32 PM

## 2016-12-15 NOTE — Patient Instructions (Signed)
Your physician recommends that you schedule a follow-up appointment in: [place]

## 2016-12-16 DIAGNOSIS — I447 Left bundle-branch block, unspecified: Secondary | ICD-10-CM

## 2016-12-16 HISTORY — DX: Left bundle-branch block, unspecified: I44.7

## 2016-12-19 ENCOUNTER — Ambulatory Visit: Payer: Self-pay | Admitting: *Deleted

## 2016-12-19 ENCOUNTER — Encounter: Payer: Self-pay | Admitting: Family Medicine

## 2016-12-20 ENCOUNTER — Other Ambulatory Visit: Payer: Self-pay

## 2016-12-20 NOTE — Patient Outreach (Signed)
[facility]) Care Management   12/20/2016  [name] [birth year] [id number]  [name] is an 71 y.o. female  Subjective: client reports feeling better. "I don't want to see another hospital. My main problem is this (pointing to her abdominal drain".   Objective:   Review of Systems  Constitutional: Negative.   HENT: Negative.   Eyes: Negative.   Respiratory: Negative for wheezing.        Decreased breath sounds.  Cardiovascular: Negative.   Gastrointestinal: Negative.   Genitourinary: Negative.   Musculoskeletal: Negative.   Skin:       Bruising(healing) noted on left side of abdomen. Dressing to right side of abdominal drain intact-brownish mustard looking drainage to leg bag.   Neurological: Negative.   Endo/Heme/Allergies: Negative.   Psychiatric/Behavioral: Negative.   BP 110/64   Pulse 91   Resp 20   Ht 1.549 m ('5\' 1"'$ )   Wt 141 lb (64 kg)   SpO2 98%   BMI 26.64 kg/m    Physical Exam  Encounter Medications:   Outpatient Encounter Prescriptions as of 12/20/2016  Medication Sig Note  . acetaminophen (TYLENOL) 500 MG tablet Take 500-1,000 mg by mouth every 6 (six) hours as needed for headache (or pain).    Marland Kitchen albuterol (PROAIR HFA) 108 (90 Base) MCG/ACT inhaler Inhale 2 puffs into the lungs every 6 (six) hours as needed for wheezing or shortness of breath.   Marland Kitchen albuterol (PROVENTIL) (2.5 MG/3ML) 0.083% nebulizer solution USE ONE VIAL IN NEBULIZER EVERY 4 HOURS AS NEEDED FOR WHEEZING OR SHORTNESS OF BREATH   . aspirin 81 MG chewable tablet Chew 81 mg by mouth daily.   Marland Kitchen atorvastatin (LIPITOR) 80 MG tablet TAKE ONE TABLET BY MOUTH ONCE DAILY (Patient taking differently: TAKE ONE TABLET BY MOUTH ONCE DAILY AT BEDTIME)   . cetirizine (ZYRTEC) 10 MG tablet Take 10 mg by mouth daily.   . clopidogrel (PLAVIX) 75 MG tablet Take 1 tablet (75 mg total) by mouth daily.   Marland Kitchen FLOVENT HFA 220 MCG/ACT inhaler INHALE TWO PUFFS BY MOUTH TWICE DAILY   .  fluticasone (FLONASE) 50 MCG/ACT nasal spray Place 2 sprays into both nostrils at bedtime.   . furosemide (LASIX) 40 MG tablet Take 1 tablet (40 mg total) by mouth daily.   Marland Kitchen HUMULIN 70/30 KWIKPEN (70-30) 100 UNIT/ML PEN Per sliding scale, takes this qAM and aPM:  140-199 - 2 units, 200-250 - 4 units, 251-299 - 6 units,  300-349 - 8 units,  350 or above 10 units. Dispense syringes and needles as needed, Ok to switch to PEN if approved. Substitute to any brand approved. DX DM2, Code E11.65 12/01/2016: Daughter verified the dosage (via phone)  . metFORMIN (GLUCOPHAGE) 500 MG tablet Take 500 mg by mouth 2 (two) times daily with a meal.   . nitroGLYCERIN (NITROSTAT) 0.4 MG SL tablet Place 1 tablet (0.4 mg total) under the tongue every 5 (five) minutes as needed. For chest pain 12/01/2016: Patient carries this, but has never had to use any recently  . pantoprazole (PROTONIX) 40 MG tablet TAKE ONE TABLET BY MOUTH ONCE DAILY (Patient taking differently: TAKE ONE TABLET BY MOUTH ONCE DAILY AT BEDTIME)   . potassium chloride (K-DUR) 10 MEQ tablet Take 1 tablet (10 mEq total) by mouth daily.   . predniSONE (DELTASONE) 10 MG tablet Take 4 tabs for 3 days, then 3 tabs for 3 days, then 2 tabs for 3 days, then 1 tab for 3 days, then  1/2 tab for 4 days.   Marland Kitchen loratadine (CLARITIN) 10 MG tablet Take 10 mg by mouth at bedtime.     No facility-administered encounter medications on file as of 12/20/2016.     Functional Status:   In your present state of health, do you have any difficulty performing the following activities: 12/02/2016 10/04/2016  Hearing? N N  Vision? N N  Difficulty concentrating or making decisions? N N  Walking or climbing stairs? Y N  Dressing or bathing? N N  Doing errands, shopping? N N  Preparing Food and eating ? - N  Using the Toilet? - N  In the past six months, have you accidently leaked urine? - N  Do you have problems with loss of bowel control? - N  Managing your Medications? - N   Managing your Finances? - N  Housekeeping or managing your Housekeeping? - Y  Some recent data might be hidden    Fall/Depression Screening:    PHQ 2/9 Scores 10/04/2016 07/05/2016 06/28/2016 04/06/2015 04/06/2015 10/04/2013  PHQ - 2 Score 1 0 0 0 0 0   Fall Risk  12/20/2016 10/04/2016 07/05/2016 06/28/2016 04/06/2015  Falls in the past year? No Yes Yes Yes Yes  Number falls in past yr: - 2 or more '1 1 1  '$ Injury with Fall? - Yes No Yes No  Risk Factor Category  - High Fall Risk - High Fall Risk -  Risk for fall due to : - History of fall(s);Impaired balance/gait;Impaired mobility History of fall(s) History of fall(s) -  Follow up Falls prevention discussed Education provided;Falls prevention discussed Falls prevention discussed Falls evaluation completed Falls prevention discussed    Assessment:  71 year old with recent admission to hospital due to sepsis, upper respiratory failure. History of COPD, HTN, DM, hyperlipidemia, Torsade-de pointes, stroke, diverticulitis.  Re: heart failure-neither client or her husband are aware of a heart failure diagnosis for client. Mr. Strahm who helps client manage her care emphatically denies that client has a history of heart failure herself, adding there was heart failure in her family. Client has a follow up appointment scheduled with primary care on March 5th. RNCM encouraged both client and her husband to discuss this diagnosis with primary care. RNCM reviewed briefly the heart failure zone tool and discussed why it is important to clarify this diagnosis with provider. Both acknowledged understanding.  Medications reviewed. Client without any questions or concerns.  Regarding COPD-Client denies any shortness of breath. Takes medications as prescribed.  Plan: transition of care call next week.  Thea Silversmith, RN, MSN, [place] Coordinator Cell: [phone]

## 2016-12-22 ENCOUNTER — Other Ambulatory Visit: Payer: Self-pay | Admitting: General Surgery

## 2016-12-22 ENCOUNTER — Ambulatory Visit
Admission: RE | Admit: 2016-12-22 | Discharge: 2016-12-22 | Disposition: A | Discharge status: Home / Self Care | Payer: Medicare Other | Source: Ambulatory Visit | Attending: General Surgery | Admitting: General Surgery

## 2016-12-22 DIAGNOSIS — Z4803 Encounter for change or removal of drains: Secondary | ICD-10-CM | POA: Diagnosis not present

## 2016-12-22 DIAGNOSIS — K5720 Diverticulitis of large intestine with perforation and abscess without bleeding: Secondary | ICD-10-CM | POA: Diagnosis not present

## 2016-12-22 HISTORY — PX: IR GENERIC HISTORICAL: IMG1180011

## 2016-12-22 NOTE — Progress Notes (Signed)
Chief Complaint: Drain injection  Referring Physician(s): Ramirez,Armando  Supervising Physician: Daryll Brod  History of Present Illness: [name] is a 71 y.o. female who underwent a percutaneous drainage catheter placement on 09/12/2016 by Dr. Kathlene Cote for a diverticular abscess.   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   She returned again on 10/26/2016 , 11/03/2016, and 11/24/2016 for injection which again revealed persistent fistula.  She returns again today for another injection.   She is a very poor historian and always answers "I don't know" to every question.  She does not know if she is taking antibiotics. She states "I take so may pills I have no idea what they are".  She is accompanied by her sister again today who really is no help either.  She has not been documenting the output however she showed me on the gravity bag the amount of output and I quantified it about 5 mL per day. This is much less than prior.  They are not flushing the drain.  She does deny abdominal pain, fever/chills.  At the previous visit the drain was pulled back about 2 cm and after another injection it was in better position.  Past Medical History:  Diagnosis Date  . Age-related nuclear cataract of both eyes 2016   +cortical age related cataracts OU   . Allergy    seasonal  . Arthritis    hands  . Bilateral diabetic retinopathy (HCC) 2015   Dr. Zigmund Daniel  . Blood transfusion without reported diagnosis    when taking chemo  . Chronic combined systolic and diastolic congestive heart failure ([place])    a. 08/2016 Echo: EF 30-35%, gr1DD, PASP 45mHg;  b. 11/2016 Echo: EF 40-45%, no rwma, nl RV fxn, mild TR, PASP 38mg.  . CKD (chronic  kidney disease), stage II    GFR 60s  . COPD (ch[place] obstructive pulmonary disease) ([place]  . CVA (cerebral vascular accident) ([place]   MRI did show a left-sided small ischemic stroke which was acute but likely incidental (MRI was done b/c pt had TIA sx's in hospital).  Neuro put her on plavix at that time.  . Diabetes mellitus with complication (HCC)    diab retinopathy OU (laser)  . Diverticulitis 05/2016  . GERD (gastroesophageal reflux disease)    protonix  . History of diverticulitis of colon    with abscess; required IR percutaneous drain placed 09/2016.  . Marland Kitchenyperlipidemia   . Hypertension   . Lung cancer ([place]   non-small cell lung ca, stage III in 05/2011; systemic chemotherapy concurrent with radiation followed by prophylactic cranial irradiation and has been observation since July of 2010 with no evidence for disease recurrence-released from onc f/u 12/2014 (needs annual cxr by PCP).  CXR 08/2015 stable.  . Mild cognitive impairment with memory loss    Likely from brain radiation therapy  . Non-obstructive CAD (coronary artery disease)    a. Cath 2006 preserved LV fxn, scattered irregularities without critical stenosis; b. 2008 stress echo negative for ischemia, but with hypertensive response; c. 08/2016 Cath: D1 25%, otw nl.  . Open-angle glaucoma 2016   Dr. BeShirley Muscat(bilateral)---responding to topical therapy  . Osteopenia 03/2016   03/2016 DEXA T-score -2.2  . Pneumonia    hospitalization 11/2016; hypoxemic resp failure--d/c'd home with home oxygen therapy  .  Takotsubo cardiomyopathy    a. 08/2016 Echo: EF 30-35%, gr1DD, PASP 43mHg;  b. 08/2016 Cath: nonobs Dzs;  c. 11/2016 Echo: EF 40-45%, no rwma, nl RV fxn, mild TR, PASP 320mg.  . [place] de pointes ([place]   a. 08/2016 in setting of diverticulitis & pneumoperitoneum and Takotsubo CM -->prolonged QT, seen by EP-->avoid meds with potential for QT prolongation.    Past Surgical History:  Procedure  Laterality Date  . ABDOMINAL HYSTERECTOMY  1997  . APPENDECTOMY    . CARDIAC CATHETERIZATION N/A 09/14/2016   Procedure: Left Heart Cath and Coronary Angiography;  Surgeon: ThTroy SineMD;  [facility];  Service: Cardiovascular;  Laterality: N/A;  . CARDIAC CATHETERIZATION  09/14/2016   EF 25%, akinesis in a pattern of Takutsubos CM.  . Marland KitchenARPAL TUNNEL RELEASE    . CATARACT EXTRACTION    . CHOLECYSTECTOMY  2002  . COLONOSCOPY  10/24/00   normal.  [facility] hemoccult testing via [place]/25/16 was NEG  . IR GENERIC HISTORICAL  09/19/2016   IR SINUS/FIST TUBE CHK-NON GI 09/19/2016 JaCorrie MckusickDO [facility]  . IR GENERIC HISTORICAL  10/06/2016   IR RADIOLOGIST EVAL & MGMT 10/06/2016 [facility]  . IR GENERIC HISTORICAL  10/26/2016   IR RADIOLOGIST EVAL & MGMT 10/26/2016 [facility]  . IR GENERIC HISTORICAL  11/03/2016   IR RADIOLOGIST EVAL & MGMT 11/03/2016 WeArdis RowanPA-C [facility]  . IR GENERIC HISTORICAL  11/24/2016   IR RADIOLOGIST EVAL & MGMT 11/24/2016 WeArdis RowanPA-C [facility]  . IR GENERIC HISTORICAL  12/05/2016   IR SINUS/FIST TUBE CHK-NON GI 12/05/2016 JoSandi MariscalMD [facility]  . LEFT HEART CATHETERIZATION WITH CORONARY ANGIOGRAM N/A 05/30/2012   Procedure: LEFT HEART CATHETERIZATION WITH CORONARY ANGIOGRAM;  Surgeon: ChBurnell BlanksMD;  [facility];  Service: Cardiovascular;  Laterality: N/A;  . TRANSTHORACIC ECHOCARDIOGRAM  09/14/2016   EF 30-35 %, Akinesis of the mid-apicalanteroseptal myocardium.  Grade I DD.  Mild pulm HTN.  Repeat echo ordered by cardiologist as of 11/23/16.    Allergies: Lactose intolerance (gi) and Ibuprofen  Medications: Prior to Admission medications   Medication Sig Start Date End Date Taking? Authorizing Provider  acetaminophen (TYLENOL) 500 MG tablet Take 500-1,000 mg by mouth every 6 (six) hours as needed for headache (or pain).     Historical Provider, MD  albuterol  (PROAIR HFA) 108 (90 Base) MCG/ACT inhaler Inhale 2 puffs into the lungs every 6 (six) hours as needed for wheezing or shortness of breath.    Historical Provider, MD  albuterol (PROVENTIL) (2.5 MG/3ML) 0.083% nebulizer solution USE ONE VIAL IN NEBULIZER EVERY 4 HOURS AS NEEDED FOR WHEEZING OR SHORTNESS OF BREATH 12/16/15   PhTammi SouMD  aspirin 81 MG chewable tablet Chew 81 mg by mouth daily.    Historical Provider, MD  atorvastatin (LIPITOR) 80 MG tablet TAKE ONE TABLET BY MOUTH ONCE DAILY Patient taking differently: TAKE ONE TABLET BY MOUTH ONCE DAILY AT BEDTIME 07/18/16   PhTammi SouMD  cetirizine (ZYRTEC) 10 MG tablet Take 10 mg by mouth daily.    Historical Provider, MD  clopidogrel (PLAVIX) 75 MG tablet Take 1 tablet (75 mg total) by mouth daily. 11/21/16   PhTammi SouMD  FLOVENT HFA 220 MCG/ACT inhaler INHALE TWO PUFFS BY MOUTH TWICE DAILY 01/12/16   PhTammi SouMD  fluticasone (FLONASE) 50 MCG/ACT nasal spray Place 2  sprays into both nostrils at bedtime. 06/25/14   [name], MD  furosemide (LASIX) 40 MG tablet Take 1 tablet (40 mg total) by mouth daily. 12/09/16   Jennifer Chahn-Yang Choi, DO  HUMULIN 70/30 KWIKPEN (70-30) 100 UNIT/ML PEN Per sliding scale, takes this qAM and aPM:  140-199 - 2 units, 200-250 - 4 units, 251-299 - 6 units,  300-349 - 8 units,  350 or above 10 units. Dispense syringes and needles as needed, Ok to switch to PEN if approved. Substitute to any brand approved. DX DM2, Code E11.65 09/05/16   Historical Provider, MD  loratadine (CLARITIN) 10 MG tablet Take 10 mg by mouth at bedtime.     Historical Provider, MD  metFORMIN (GLUCOPHAGE) 500 MG tablet Take 500 mg by mouth 2 (two) times daily with a meal.    Historical Provider, MD  nitroGLYCERIN (NITROSTAT) 0.4 MG SL tablet Place 1 tablet (0.4 mg total) under the tongue every 5 (five) minutes as needed. For chest pain 12/31/12   Lisabeth Pick, MD  pantoprazole (PROTONIX) 40 MG tablet TAKE ONE  TABLET BY MOUTH ONCE DAILY Patient taking differently: TAKE ONE TABLET BY MOUTH ONCE DAILY AT BEDTIME 10/15/15   [name], MD  potassium chloride (K-DUR) 10 MEQ tablet Take 1 tablet (10 mEq total) by mouth daily. 12/09/16   Jennifer Chahn-Yang Choi, DO  predniSONE (DELTASONE) 10 MG tablet Take 4 tabs for 3 days, then 3 tabs for 3 days, then 2 tabs for 3 days, then 1 tab for 3 days, then 1/2 tab for 4 days. 12/09/16   Shon Millet, DO     Family History  Problem Relation Age of Onset  . Heart failure Mother   . Kidney disease Mother     renal failure  . Diabetes Mother   . Diabetes Father   . Diabetes Brother   . Heart disease Brother   . Heart attack Brother   . Heart attack Brother   . Heart attack Brother   . Colon cancer Neg Hx     Social History   Social History  . Marital status: Married    Spouse name: N/A  . Number of children: N/A  . Years of education: N/A   Occupational History  . Retired Systems analyst Unemployed   Social History Main Topics  . Smoking status: Former Smoker    Packs/day: 1.00    Years: 45.00    Types: Cigarettes    Quit date: 10/24/2008  . Smokeless tobacco: Never Used  . Alcohol use No  . Drug use: No  . Sexual activity: Not on file   Other Topics Concern  . Not on file   Social History Narrative   Lives in [place], has husband and daughter and son. Daughter's family is living with her and husband at present. She is ambulatory daily without cane or walker.     Review of Systems  Unable to perform ROS: Other  Pt poor historian and unreliable  Vital Signs: There were no vitals taken for this visit.  Physical Exam Awake and alert NAD Abdomen soft, NTND Drain in place anterior abdomen About 15 mL of tan milky drainage in gravity bag. Drain injection today shows the fistula is resolving. It appears the contrast only illuminates at the bowel wall and does not enter the lumen.  Imaging: Dg Chest 2  View  Result Date: 12/09/2016 CLINICAL DATA:  Shortness of breath, respiratory failure. EXAM: CHEST  2 VIEW COMPARISON:  Radiographs of December 03, 2016. CT scan of September 22, 2016. FINDINGS: Stable right peritracheal prominence is noted which may represent radiation fibrosis, although recurrent malignancy cannot be excluded. No pneumothorax is noted. Mild right pleural effusion is noted. Atherosclerosis of thoracic aorta is noted. Bony thorax is unremarkable. Minimal left basilar subsegmental atelectasis is noted. IMPRESSION: Minimal left basilar subsegmental atelectasis. Mild right pleural effusion. Stable right peritracheal prominence is noted which may represent radiation fibrosis, although recurrent malignancy cannot be excluded. CT scan of the chest may be performed further evaluation. Aortic atherosclerosis. Electronically Signed   By: Marijo Conception, M.D.   On: 12/09/2016 09:56   Ct Abdomen Pelvis W Contrast  Result Date: 12/02/2016 CLINICAL DATA:  History of hypertension and diverticulitis with colonic fistula EXAM: CT ABDOMEN AND PELVIS WITH CONTRAST TECHNIQUE: Multidetector CT imaging of the abdomen and pelvis was performed using the standard protocol following bolus administration of intravenous contrast. CONTRAST:  155m ISOVUE-300 IOPAMIDOL (ISOVUE-300) INJECTION 61% COMPARISON:  10/06/2016 FINDINGS: Lower chest: New bilateral small pleural effusions are noted as well as bilateral pneumonia. Mild pericardial effusion is noted. Hepatobiliary: No focal liver abnormality is seen. Status post cholecystectomy. No biliary dilatation. Pancreas: Unremarkable. No pancreatic ductal dilatation or surrounding inflammatory changes. Spleen: Normal in size without focal abnormality. Adrenals/Urinary Tract: Adrenal glands are unremarkable. Kidneys are normal, without renal calculi, focal lesion, or hydronephrosis. Bladder is unremarkable. Stomach/Bowel: Changes of diverticulosis are identified. Drainage  catheter is noted adjacent to the sigmoid colon consistent with history of prior abscess. No residual cavity is seen. The appendix is within normal limits. No obstructive changes are noted. Vascular/Lymphatic: Aortic atherosclerosis. No enlarged abdominal or pelvic lymph nodes. Reproductive: Status post hysterectomy. No adnexal masses. Other: No abdominal wall hernia or abnormality. No abdominopelvic ascites. Musculoskeletal: No acute or significant osseous findings. IMPRESSION: Bilateral pleural effusions and bibasilar pneumonia which are new from the prior exam. Diverticulosis without diverticulitis. A drainage catheter is noted in place without significant residual abscess cavity. Small pericardial effusion. Electronically Signed   By: MInez CatalinaM.D.   On: 12/02/2016 15:23   Ir Sinus/fist Tube Chk-non Gi  Result Date: 12/05/2016 CLINICAL DATA:  History of diverticular abscess, post percutaneous drainage catheter placement initially on 09/12/2016 and replacement on 09/21/2016. CT scan of the abdomen and pelvis performed 10/06/2016 demonstrates resolution of the diverticular abscess however subsequent percutaneous drainage catheter injections performed 10/06/2016, 10/26/2016, 11/03/2016 and 11/24/2016 new all have demonstrated a persistent fistulous connection between the end of the percutaneous drainage catheter in the adjacent sigmoid colon. Patient presents today for repeat fluoroscopic guided drainage catheter injection. The patient states she is intermittently flushing the percutaneous drain. EXAM: SINUS TRACT INJECTION/FISTULOGRAM COMPARISON:  CT scan abdomen and pelvis -09/11/2016; 10/06/2016; CT-guided percutaneous drainage catheter placement- 09/12/2016; 09/21/2016; fluoroscopic guided percutaneous drainage catheter injection - 10/06/2016; 10/26/2016; 11/03/2016; 11/24/2016 CONTRAST:  10 cc Isovue 300, administered via the existing percutaneous drainage catheter FLUOROSCOPY TIME:  12 seconds (1 mGy)  TECHNIQUE: The patient was positioned supine on the fluoroscopy table. A preprocedural spot fluoroscopic image was obtained of the lower abdomen and pelvis and existing percutaneous drainage catheter Multiple spot fluoroscopic and radiographic images were obtained following the injection of a small amount of contrast via the existing percutaneous drainage catheter. Images reviewed in the procedure was terminated. The drainage catheter was flushed with a small amount of saline and reconnected to a gravity bag. FINDINGS: Preprocedural spot fluoroscopic image demonstrates unchanged positioning of the percutaneous drainage catheter with and coiled and  locked over lying the midline of the lower pelvis Contrast injection demonstrates opacification of the decompressed abscess cavity however there is brisk communication between the end of the percutaneous drainage catheter and the adjacent sigmoid colon. 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 PLAN: - the patient was instructed to no longer flush the percutaneous drainage catheter. - the patient was instructed to maintain diligent records regarding drainage catheter output. - the patient will follow-up in the interventional radiology drain clinic in 2 weeks for repeat drainage catheter injection and evaluation and management Electronically Signed   By: Sandi Mariscal M.D.   On: 12/05/2016 15:46   Dg Sinus/fist Tube Chk-non Gi  Result Date: 11/24/2016 INDICATION: Follow-up diverticular abscess.  Persistent colonic fistula. EXAM: ABSCESS INJECTION MEDICATIONS: None ANESTHESIA/SEDATION: None FLUOROSCOPY TIME:  1 minutes and 12 seconds, 21 mGy CONTRAST:  10 mL Omnipaque 627 COMPLICATIONS: None immediate. PROCEDURE: The pelvic drain was injected with contrast under fluoroscopy. Catheter was slightly pulled back and additional contrast was injected. Catheter was flushed with saline and  secured to skin. FINDINGS: The pigtail catheter is in a stable position within the pelvis. Injection contrast demonstrates immediate filling of the adjacent sigmoid colon. There is no residual abscess collection around the drain. Some of the contrast drains around the drain towards the skin. Catheter was slightly pulled back and still remains adjacent to the sigmoid colon. IMPRESSION: Persistent colonic fistula.  No residual abscess collection. The drainage catheter was intentionally slightly pulled back and will continue drain flushing and gravity drainage. Recommend surgical consultation. This drain has been present since 09/21/2016. Patient will return in 1-2 weeks for follow-up drain injection. Electronically Signed   By: Markus Daft M.D.   On: 11/24/2016 13:22   Dg Chest Port 1 View  Result Date: 12/03/2016 CLINICAL DATA:  Respiratory failure EXAM: PORTABLE CHEST 1 VIEW COMPARISON:  12/01/2016 FINDINGS: Area dense consolidation again noted in the medial right upper lobe, stable since prior study. Continued airspace disease in the left lower lung, slightly improved. Heart is normal size. No definite effusions. No acute bony abnormality. IMPRESSION: Airspace disease in the left lower lobe could reflect pneumonia. This is slightly improved since prior study. Stable right upper lobe paramediastinal airspace disease. This could reflect radiation change although locally recurrent disease cannot be excluded. Electronically Signed   By: Rolm Baptise M.D.   On: 12/03/2016 14:53   Dg Chest Port 1 View  Result Date: 12/01/2016 CLINICAL DATA:  Patient with coarse breath sounds.  Confusion. EXAM: PORTABLE CHEST 1 VIEW COMPARISON:  Chest CT 09/22/2016; chest radiograph 10/11/2016. FINDINGS: Interval increase in right upper lobe paramediastinal consolidation. Interval development of consolidative opacities within the left mid lower lung. Stable cardiac and mediastinal contours. Probable small left pleural effusion.  IMPRESSION: Interval development of consolidation within the left mid and lower lung which may represent pneumonia in the appropriate clinical setting. Interval increase in consolidation within the paramediastinal right lung. This may represent evolving postradiation change however locally recurrent disease is not excluded. After resolution of the acute symptomatology, recommend correlation with chest CT. Electronically Signed   By: Lovey Newcomer M.D.   On: 12/01/2016 21:10   Ir Radiologist Eval & Mgmt  Result Date: 11/24/2016 Please refer to "Notes" to see consult details.   Labs:  CBC:  Recent Labs  12/05/16 0959 12/06/16 0659 12/08/16 0426 12/09/16 0452  WBC 15.4* 9.5 6.9 8.5  HGB 10.5* 9.4* 9.3* 9.9*  HCT 32.7* 29.1* 29.8* 31.3*  PLT 236 192 236 231    COAGS:  Recent Labs  06/14/16 2032 09/12/16 0422 09/15/16 0450 09/21/16 0815 12/01/16 2204  INR 1.20 1.34 1.29 1.12 1.33  APTT 34  --  26  --  30    BMP:  Recent Labs  12/06/16 0659 12/07/16 0625 12/08/16 0426 12/09/16 0452  NA 138 139 140 136  K 4.0 3.4* 3.8 3.7  CL 108 102 102 99*  CO2 '22 25 27 27  '$ GLUCOSE 123* 103* 110* 101*  BUN '9 11 15 19  '$ CALCIUM 8.5* 8.6* 8.5* 8.6*  CREATININE 0.85 0.81 0.79 0.81  GFRNONAA >60 >60 >60 >60  GFRAA >60 >60 >60 >60    LIVER FUNCTION TESTS:  Recent Labs  11/04/16 0921 12/01/16 1946 12/03/16 0259 12/06/16 0659  BILITOT 0.5 0.8 0.5 0.5  AST '13 20 16 20  '$ ALT 14 15 12* 15  ALKPHOS 137* 93 79 63  PROT 6.5 5.8* 5.3* 5.8*  ALBUMIN 4.0 3.0* 2.3* 2.4*    TUMOR MARKERS: No results for input(s): AFPTM, CEA, CA199, CHROMGRNA in the last 8760 hours.  Assessment:  S/P percutaneous drainage catheter placement on 09/12/2016 by Dr. Kathlene Cote for a diverticular abscess.  Drain injection today shows the fistula is resolving. It appears the contrast only illuminates at the bowel wall and does not enter the lumen. Images reviewed by Dr. Annamaria Boots.  Continue with the drain and  routine drain care and documentation of output.  Return in 2 weeks with another injection, hopefully it can be discontinued at that time.   Electronically Signed: Murrell Redden PA-C 12/22/2016, 12:41 PM   Please refer to Dr. Fritz Pickerel attestation of this note for management and plan.

## 2016-12-23 ENCOUNTER — Other Ambulatory Visit: Payer: Self-pay | Admitting: Family Medicine

## 2016-12-23 ENCOUNTER — Encounter: Payer: Self-pay | Admitting: Family Medicine

## 2016-12-23 NOTE — Telephone Encounter (Signed)
Patient needs a refill on rx albuterol (PROVENTIL) (2.5 MG/3ML) 0.083% nebulizer solution  [place], [place]. [phone] (Phone) [phone] (Fax)   Please call patient once rx is ready and placed.

## 2016-12-23 NOTE — Progress Notes (Signed)
Noted  

## 2016-12-23 NOTE — Telephone Encounter (Signed)
[facility].  RF request for albuterol nebulizer LOV: 12/13/16 Next ov: 12/27/16 Last written: 12/16/15 #34m w/ 3RF

## 2016-12-27 ENCOUNTER — Encounter: Payer: Self-pay | Admitting: Family Medicine

## 2016-12-27 ENCOUNTER — Ambulatory Visit (INDEPENDENT_AMBULATORY_CARE_PROVIDER_SITE_OTHER): Payer: Medicare Other | Admitting: Family Medicine

## 2016-12-27 VITALS — BP 123/66 | HR 103 | Temp 97.8°F | Resp 16 | Ht 62.0 in | Wt 143.0 lb

## 2016-12-27 DIAGNOSIS — Z794 Long term (current) use of insulin: Secondary | ICD-10-CM

## 2016-12-27 DIAGNOSIS — D649 Anemia, unspecified: Secondary | ICD-10-CM

## 2016-12-27 DIAGNOSIS — I5181 Takotsubo syndrome: Secondary | ICD-10-CM

## 2016-12-27 DIAGNOSIS — E118 Type 2 diabetes mellitus with unspecified complications: Secondary | ICD-10-CM | POA: Diagnosis not present

## 2016-12-27 DIAGNOSIS — R9389 Abnormal findings on diagnostic imaging of other specified body structures: Secondary | ICD-10-CM

## 2016-12-27 DIAGNOSIS — R938 Abnormal findings on diagnostic imaging of other specified body structures: Secondary | ICD-10-CM | POA: Diagnosis not present

## 2016-12-27 DIAGNOSIS — R7989 Other specified abnormal findings of blood chemistry: Secondary | ICD-10-CM

## 2016-12-27 DIAGNOSIS — R946 Abnormal results of thyroid function studies: Secondary | ICD-10-CM

## 2016-12-27 DIAGNOSIS — Z85118 Personal history of other malignant neoplasm of bronchus and lung: Secondary | ICD-10-CM

## 2016-12-27 LAB — CBC
HCT: 32.7 % — ABNORMAL LOW (ref 36.0–46.0)
HEMOGLOBIN: 11 g/dL — AB (ref 12.0–15.0)
MCHC: 33.5 g/dL (ref 30.0–36.0)
MCV: 84.8 fl (ref 78.0–100.0)
PLATELETS: 205 10*3/uL (ref 150.0–400.0)
RBC: 3.86 Mil/uL — AB (ref 3.87–5.11)
RDW: 17.4 % — ABNORMAL HIGH (ref 11.5–15.5)
WBC: 7.2 10*3/uL (ref 4.0–10.5)

## 2016-12-27 LAB — FERRITIN: Ferritin: 171.6 ng/mL (ref 10.0–291.0)

## 2016-12-27 LAB — BASIC METABOLIC PANEL
BUN: 10 mg/dL (ref 6–23)
CALCIUM: 8.8 mg/dL (ref 8.4–10.5)
CO2: 33 mEq/L — ABNORMAL HIGH (ref 19–32)
Chloride: 104 mEq/L (ref 96–112)
Creatinine, Ser: 0.79 mg/dL (ref 0.40–1.20)
GFR: 76.8 mL/min (ref >60.00)
GLUCOSE: 148 mg/dL — AB (ref 70–99)
Potassium: 3.6 mEq/L (ref 3.5–5.1)
Sodium: 143 mEq/L (ref 135–145)

## 2016-12-27 LAB — IRON: IRON: 49 ug/dL (ref 42–145)

## 2016-12-27 LAB — VITAMIN B12: Vitamin B-12: 394 pg/mL (ref 211–911)

## 2016-12-27 LAB — TSH: TSH: 0.14 u[IU]/mL — ABNORMAL LOW (ref 0.35–4.50)

## 2016-12-27 LAB — T4, FREE: Free T4: 0.98 ng/dL (ref 0.60–1.60)

## 2016-12-27 LAB — IRON AND TIBC
%SAT: 20 % (ref 11–50)
IRON: 41 ug/dL — AB (ref 45–160)
TIBC: 209 ug/dL — AB (ref 250–450)
UIBC: 168 ug/dL (ref 125–400)

## 2016-12-27 NOTE — Patient Instructions (Signed)
Increase your 70/30 insulin to 20 Units every morning and continue 15 Units every evening at supper.

## 2016-12-27 NOTE — Progress Notes (Signed)
Pre visit review using our clinic review tool, if applicable. No additional management support is needed unless otherwise documented below in the visit note. 

## 2016-12-27 NOTE — Progress Notes (Signed)
OFFICE VISIT  12/27/2016   CC:  Chief Complaint  Patient presents with  . Follow-up    RCI, pt is not fasting.    HPI:    Patient is a 71 y.o. Caucasian female who presents for f/u DM 2, takotsubo CM, relatively recent history of colonic diverticular abscess with fistula.  She was seen by Dr. Raoul Pitch 12/13/16 for hosp f/u for acute hypoxemic resp failure secondary to HCAP, COPD exacerbation, and decompensated systolic HF. She was found to be at baseline at that f/u--off of oxygen.   Of note, during this hospitalization her CXR showed: Minimal left basilar subsegmental atelectasis. Mild right pleural effusion. Stable (compared to CXR earlier in the hospitalization) right peritracheal prominence is noted which may represent radiation fibrosis, although recurrent malignancy cannot be excluded. CT scan of the chest may be performed further evaluation. Aortic atherosclerosis  Cardiology f/u 12/15/16 showed her to be doing well and since her bp was low normal, they continued to hold her beta blocker.  She has another f/u with them later this month and they will re-eval restarting this med at that time.  Recent procedure in IR to reassess her fistula showed: Impression:  Small residual patent fistula from the collapsed abscess cavity to the adjacent sigmoid remains but is slightly smaller.  Cough continues to improve.  Cough is productive.  Taking alb neb tid-qid.  No recent fevers. DM 2: she forgot to bring her log, seem to be higher in evenings (>200).  Fastings more often normal, even approaching hypoglycemia.    Past Medical History:  Diagnosis Date  . Age-related nuclear cataract of both eyes 2016   +cortical age related cataracts OU   . Allergy    seasonal  . Arthritis    hands  . Bilateral diabetic retinopathy (HCC) 2015   Dr. Zigmund Daniel  . Blood transfusion without reported diagnosis    when taking chemo  . Chronic combined systolic and diastolic congestive heart failure (HCC)     Takotsubo CM:  a. 08/2016 Echo: EF 30-35%, gr1DD, PASP 18mHg;  b. 11/2016 Echo: EF 40-45%, no rwma, nl RV fxn, mild TR, PASP 317mg.  . CKD (chronic kidney disease), stage II    GFR 60s  . COPD (chronic obstructive pulmonary disease) ([place]CT[place]  . CVA (cerebral vascular accident) ([facility]   MRI did show a left-sided small ischemic stroke which was acute but likely incidental (MRI was done b/c pt had TIA sx's in hospital).  Neuro put her on plavix at that time.  . Diabetes mellitus with complication (HCC)    diab retinopathy OU (laser)  . Diverticulitis 05/2016  . GERD (gastroesophageal reflux disease)    protonix  . History of diverticulitis of colon    with abscess; required IR percutaneous drain placed 09/2016.  . Marland Kitchenyperlipidemia   . Hypertension   . Left bundle branch block (LBBB) 12/16/2016  . Lung cancer ([place]   non-small cell lung ca, stage III in 05/2011; systemic chemotherapy concurrent with radiation followed by prophylactic cranial irradiation and has been observation since July of 2010 with no evidence for disease recurrence-released from onc f/u 12/2014 (needs annual cxr by PCP).  CXR 08/2015 stable.  . Mild cognitive impairment with memory loss    Likely from brain radiation therapy  . Non-obstructive CAD (coronary artery disease)    a. Cath 2006 preserved LV fxn, scattered irregularities without critical stenosis; b. 2008 stress echo negative for ischemia, but with hypertensive response; c. 08/2016 Cath: D1  25%, otw nl.  . Open-angle glaucoma 2016   Dr. Shirley Muscat, (bilateral)---responding to topical therapy  . Osteopenia 03/2016   03/2016 DEXA T-score -2.2  . Pneumonia    hospitalization 11/2016; hypoxemic resp failure--d/c'd home with home oxygen therapy  . Takotsubo cardiomyopathy    a. 08/2016 Echo: EF 30-35%, gr1DD, PASP 54mHg;  b. 08/2016 Cath: nonobs Dzs;  c. 11/2016 Echo: EF 40-45%, no rwma, nl RV fxn, mild TR, PASP 333mg.  . [place] de pointes (HC[place]11/[place]   a.  08/2016 in setting of diverticulitis & pneumoperitoneum and Takotsubo CM -->prolonged QT, seen by EP-->avoid meds with potential for QT prolongation.    Past Surgical History:  Procedure Laterality Date  . ABDOMINAL HYSTERECTOMY  1997  . APPENDECTOMY    . CARDIAC CATHETERIZATION N/A 09/14/2016   Procedure: Left Heart Cath and Coronary Angiography;  Surgeon: ThTroy SineMD;  [facility];  Service: Cardiovascular;  Laterality: N/A;  . CARDIAC CATHETERIZATION  09/14/2016   EF 25%, akinesis in a pattern of Takutsubos CM.  . Marland KitchenARPAL TUNNEL RELEASE    . CATARACT EXTRACTION    . CHOLECYSTECTOMY  2002  . COLONOSCOPY  10/24/00   normal.  [employer] hemoccult testing via [place]/25/16 was NEG  . IR GENERIC HISTORICAL  09/19/2016   IR SINUS/FIST TUBE CHK-NON GI 09/19/2016 JaCorrie MckusickDO [facility]  . IR GENERIC HISTORICAL  10/06/2016   IR RADIOLOGIST EVAL & MGMT 10/06/2016 [facility]  . IR GENERIC HISTORICAL  10/26/2016   IR RADIOLOGIST EVAL & MGMT 10/26/2016 [facility]  . IR GENERIC HISTORICAL  11/03/2016   IR RADIOLOGIST EVAL & MGMT 11/03/2016 WeArdis RowanPA-C [facility]  . IR GENERIC HISTORICAL  11/24/2016   IR RADIOLOGIST EVAL & MGMT 11/24/2016 WeArdis RowanPA-C [facility]  . IR GENERIC HISTORICAL  12/05/2016   IR SINUS/FIST TUBE CHK-NON GI 12/05/2016 JoSandi MariscalMD [facility]  . LEFT HEART CATHETERIZATION WITH CORONARY ANGIOGRAM N/A 05/30/2012   Procedure: LEFT HEART CATHETERIZATION WITH CORONARY ANGIOGRAM;  Surgeon: ChBurnell BlanksMD;  [facility];  Service: Cardiovascular;  Laterality: N/A;  . TRANSTHORACIC ECHOCARDIOGRAM  09/14/2016   EF 30-35 %, Akinesis of the mid-apicalanteroseptal myocardium.  Grade I DD.  Mild pulm HTN.  Repeat echo ordered by cardiologist as of 11/23/16.    Outpatient Medications Prior to Visit  Medication Sig Dispense Refill  . acetaminophen (TYLENOL) 500 MG tablet Take 500-1,000 mg by  mouth every 6 (six) hours as needed for headache (or pain).     . Marland Kitchenlbuterol (PROAIR HFA) 108 (90 Base) MCG/ACT inhaler Inhale 2 puffs into the lungs every 6 (six) hours as needed for wheezing or shortness of breath.    . Marland Kitchenlbuterol (PROVENTIL) (2.5 MG/3ML) 0.083% nebulizer solution USE ONE VIAL IN NEBULIZER EVERY 4 HOURS AS NEEDED FOR WHEEZING OR SHORTNESS OF BREATH 75 mL 3  . aspirin 81 MG chewable tablet Chew 81 mg by mouth daily.    . Marland Kitchentorvastatin (LIPITOR) 80 MG tablet TAKE ONE TABLET BY MOUTH ONCE DAILY (Patient taking differently: TAKE ONE TABLET BY MOUTH ONCE DAILY AT BEDTIME) 90 tablet 1  . cetirizine (ZYRTEC) 10 MG tablet Take 10 mg by mouth daily.    . clopidogrel (PLAVIX) 75 MG tablet Take 1 tablet (75 mg total) by mouth daily. 30 tablet 6  . FLOVENT HFA 220 MCG/ACT inhaler INHALE TWO PUFFS BY MOUTH TWICE DAILY 12 g 6  .  fluticasone (FLONASE) 50 MCG/ACT nasal spray Place 2 sprays into both nostrils at bedtime. 16 g 3  . furosemide (LASIX) 40 MG tablet Take 1 tablet (40 mg total) by mouth daily. 30 tablet 0  . HUMULIN 70/30 KWIKPEN (70-30) 100 UNIT/ML PEN Per sliding scale, takes this qAM and aPM:  140-199 - 2 units, 200-250 - 4 units, 251-299 - 6 units,  300-349 - 8 units,  350 or above 10 units. Dispense syringes and needles as needed, Ok to switch to PEN if approved. Substitute to any brand approved. DX DM2, Code E11.65    . loratadine (CLARITIN) 10 MG tablet Take 10 mg by mouth at bedtime.     . metFORMIN (GLUCOPHAGE) 500 MG tablet Take 500 mg by mouth 2 (two) times daily with a meal.    . nitroGLYCERIN (NITROSTAT) 0.4 MG SL tablet Place 1 tablet (0.4 mg total) under the tongue every 5 (five) minutes as needed. For chest pain 50 tablet 3  . potassium chloride (K-DUR) 10 MEQ tablet Take 1 tablet (10 mEq total) by mouth daily. 30 tablet 0  . pantoprazole (PROTONIX) 40 MG tablet TAKE ONE TABLET BY MOUTH ONCE DAILY (Patient not taking: Reported on 12/27/2016) 30 tablet 12  . predniSONE  (DELTASONE) 10 MG tablet Take 4 tabs for 3 days, then 3 tabs for 3 days, then 2 tabs for 3 days, then 1 tab for 3 days, then 1/2 tab for 4 days. (Patient not taking: Reported on 12/27/2016) 32 tablet 0   No facility-administered medications prior to visit.     Allergies  Allergen Reactions  . Lactose Intolerance (Gi) Diarrhea  . Ibuprofen Other (See Comments)    REACTION: Anxiousness, hyperventilates    ROS As per HPI  PE: Blood pressure 123/66, pulse (!) 103, temperature 97.8 F (36.6 C), temperature source Oral, resp. rate 16, height '5\' 2"'$  (1.575 m), weight 143 lb (64.9 kg), SpO2 97 %. Gen: Alert, well appearing.  Patient is oriented to person, place, time, and situation. AFFECT: pleasant, lucid thought and speech. CV: Regular, tachy to 105-110 range, no m/r/g. LUNGS: CTA bilat, nonlabored resps. EXT: no clubbing, cyanosis, or edema.   LABS:  Lab Results  Component Value Date   TSH 0.228 (L) 12/02/2016   Lab Results  Component Value Date   WBC 8.5 12/09/2016   HGB 9.9 (L) 12/09/2016   HCT 31.3 (L) 12/09/2016   MCV 87.7 12/09/2016   PLT 231 12/09/2016   Lab Results  Component Value Date   CREATININE 0.81 12/09/2016   BUN 19 12/09/2016   NA 136 12/09/2016   K 3.7 12/09/2016   CL 99 (L) 12/09/2016   CO2 27 12/09/2016   Lab Results  Component Value Date   ALT 15 12/06/2016   AST 20 12/06/2016   ALKPHOS 63 12/06/2016   BILITOT 0.5 12/06/2016   Lab Results  Component Value Date   CHOL 65 12/02/2016   Lab Results  Component Value Date   HDL 34 (L) 12/02/2016   Lab Results  Component Value Date   LDLCALC 23 12/02/2016   Lab Results  Component Value Date   TRIG 42 12/02/2016   Lab Results  Component Value Date   CHOLHDL 1.9 12/02/2016   Lab Results  Component Value Date   HGBA1C 6.5 (H) 12/02/2016   IMPRESSION AND PLAN:  1) DM 2, not ideal control lately. Increase 70/30 insulin to 20 U qBF and continue 15 U qSupper.  2) Normocytic anemia:  suspect this  is related to frequent phlebotomy in hospitalization and post-hospitalization recently.  Will recheck CBC, check iron studies and vit B12 today.  3) Low TSH: possible hyperthyroidism---her HR is elevated today and has been lately. However, she has been off her beta blocker due to marginal low bp so this is likely contributing as well. Check TSH, free T3 and T 3 total.  4) CXR during 11/2016 hospitalization: Stable (compared to CXR earlier in the hospitalization) right peritracheal prominence is noted which may represent radiation fibrosis, although recurrent malignancy cannot be excluded. CT scan of the chest may be performed further Evaluation.  Pt is in favor of checking CT.  Will check BMET today and make sure GFR >60 still. After result returns, order CT chest.  5) Takotsubo CM: most recent echo 11/2016, EF improved to 40-45%. Continue cardiology follow up.  An After Visit Summary was printed and given to the patient.  FOLLOW UP: Return in about 3 months (around 03/29/2017) for routine chronic illness f/u.  Signed:  Crissie Sickles, MD           12/27/2016

## 2016-12-28 LAB — T3: T3, Total: 89 ng/dL (ref 76–181)

## 2016-12-29 LAB — METHYLMALONIC ACID, SERUM: Methylmalonic Acid, Quant: 430 nmol/L — ABNORMAL HIGH (ref 87–318)

## 2016-12-30 ENCOUNTER — Encounter: Payer: Self-pay | Admitting: Interventional Radiology

## 2016-12-30 ENCOUNTER — Other Ambulatory Visit: Payer: Self-pay

## 2016-12-30 ENCOUNTER — Telehealth: Payer: Self-pay | Admitting: Family Medicine

## 2016-12-30 NOTE — Patient Outreach (Signed)
[place] [facility]) Care Management  12/30/2016  [name] [birth year] [id number]    Subjective: "Everything is ok" denies any concerns.  RNCM called for transition of care. Client reports she is doing well. Reports she has seen her primary care physician and insulin dosing has been changed. She denies any difficulty breathing and no concerns.  Plan: continue weekly transition of care call.  Thea Silversmith, RN, MSN, [place] Coordinator Cell: [phone]

## 2016-12-30 NOTE — Telephone Encounter (Signed)
Return call to patient to advise per pcp's ov note dated 12/27/16, hill check BMET first and make sure GFR >60 still.  After result returns, he will order CT chest.

## 2016-12-30 NOTE — Telephone Encounter (Signed)
[name] will you please review this for pt. Order is in but I'm not sure if pt needs an apt. Please let pt know. Thanks.

## 2016-12-30 NOTE — Telephone Encounter (Signed)
Patient called to see if Dr. Anitra Lauth has scheduled the X-ray appointment/referral or does the patient have to schedule herself. Please call patient at [phone] to discuss X-ray.

## 2016-12-31 ENCOUNTER — Encounter: Payer: Self-pay | Admitting: Family Medicine

## 2017-01-02 ENCOUNTER — Other Ambulatory Visit: Payer: Self-pay | Admitting: Family Medicine

## 2017-01-02 ENCOUNTER — Other Ambulatory Visit: Payer: Self-pay | Admitting: *Deleted

## 2017-01-02 MED ORDER — FUROSEMIDE 40 MG PO TABS: 40.0000 mg | ORAL_TABLET | Freq: Every day | ORAL | 6 refills | Status: DC

## 2017-01-02 MED ORDER — POTASSIUM CHLORIDE ER 10 MEQ PO TBCR: 10.0000 meq | EXTENDED_RELEASE_TABLET | Freq: Every day | ORAL | 6 refills | Status: DC

## 2017-01-02 NOTE — Telephone Encounter (Signed)
Patient's husband requesting refill on rx   Furosemide '40mg'$  tablet and klor-con.   [place], [place]. [phone] (Phone) [phone] (Fax)   He states that medications were filled by hospital. Please call to advise if any further questions.

## 2017-01-02 NOTE — Telephone Encounter (Signed)
RF request for furosemide '40mg'$  LOV: 01/06/17 Next ov: None Last written: 12/09/16 #30 w/ 0RF  Please advise. Thanks.

## 2017-01-05 ENCOUNTER — Other Ambulatory Visit: Payer: Self-pay

## 2017-01-05 NOTE — Patient Outreach (Signed)
[place] [facility]) Care Management  01/05/2017  [name] 02/10/1946 [id number]   Subjective: none  Objective: none  Assessment: 71 year old being followed for transition of care post hospitalization. RNCM called for this week's transition of care call. However received message that the number has been changed, disconnected or is no longer in service. RNCM dialed the number twice.  Plan: RNCM will call next week for transition of care follow up.  Thea Silversmith, RN, MSN, [place] Coordinator Cell: [phone]

## 2017-01-09 ENCOUNTER — Other Ambulatory Visit: Payer: Self-pay

## 2017-01-09 NOTE — Patient Outreach (Signed)
[place] [facility]) Care Management  01/09/2017  [name] [birth year] [id number]  Assessment: RNCM called for transition of care. Client denies any concerns. Reports coughing the usual amount of phlegm.   Re: drain-client reports she has had very little drainage in the past week and has had no drainage in the past two day. Client reports she has a follow up tomorrow regarding her drain.   Client reports follow up scheduled with cardiologist on Thursday. RNCN encouraged client to ask about status of heart failure diagnosis noted in chart. Client states she will discuss with cardiologist.  Transition of care program complete  Plan: follow up call next month to assess if there are any additional care management needs.  Thea Silversmith, RN, MSN, [place] Coordinator Cell: [phone]

## 2017-01-10 ENCOUNTER — Ambulatory Visit
Admission: RE | Admit: 2017-01-10 | Discharge: 2017-01-10 | Disposition: A | Discharge status: Home / Self Care | Payer: Medicare Other | Source: Ambulatory Visit | Attending: General Surgery | Admitting: General Surgery

## 2017-01-10 ENCOUNTER — Other Ambulatory Visit: Payer: Self-pay | Admitting: General Surgery

## 2017-01-10 DIAGNOSIS — K5720 Diverticulitis of large intestine with perforation and abscess without bleeding: Secondary | ICD-10-CM

## 2017-01-10 HISTORY — PX: IR RADIOLOGIST EVAL & MGMT: IMG5224

## 2017-01-10 NOTE — Progress Notes (Signed)
Referring Physician(s):  Dr. Ralene Ok  Chief Complaint: The patient is seen in follow up today s/p LLQ diverticular drain placed 09/12/2016  History of present illness: [name] is a 71 y.o. female who underwent a percutaneous drainage catheter placement on 09/12/2016 by Dr. Kathlene Cote for a diverticular abscess.  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  She returned again on 10/26/2016 , 11/03/2016, 11/24/2016, and 12/22/2016 for injection which again revealed persistent fistula. Patient presents to clinic today for another follow-up and injection.  She brings a drain log with her today showing very small amount (</=10 mL) of thick brownish fluid daily for the past 2 weeks. She has not been flushing the drain.  Her son is with her today, but he does not live with her and is therefore not attuned to her daily drain care.  She denies abdominal pain, fever/chills.  She has been eating, drinking, voiding as usual.   She does not have surgical follow-up scheduled but will see her cardiologist this week.    Past Medical History:  Diagnosis Date  . Age-related nuclear cataract of both eyes 2016   +cortical age related cataracts OU   . Allergy    seasonal  . Arthritis    hands  . Bilateral diabetic retinopathy (HCC) 2015   Dr. Zigmund Daniel  . Blood transfusion without reported diagnosis    when taking chemo  . Chronic combined systolic and diastolic congestive heart failure (HCC)    Takotsubo CM:  a. 08/2016 Echo: EF 30-35%, gr1DD, PASP 29mHg;  b. 11/2016 Echo: EF 40-45%, no rwma, nl RV fxn, mild TR, PASP 338mg.  . CKD (chronic kidney disease), stage II    GFR 60s  . COPD (chronic obstructive pulmonary disease) ([place]  . CVA (cerebral vascular accident)  ([place]12/2017   MRI did show a left-sided small ischemic stroke which was acute but likely incidental (MRI was done b/c pt had TIA sx's in hospital).  Neuro put her on plavix at that time.  . Diabetes mellitus with complication (HCC)    diab retinopathy OU (laser)  . Diverticulitis 05/2016  . GERD (gastroesophageal reflux disease)    protonix  . History of diverticulitis of colon    with abscess; required IR percutaneous drain placed 09/2016.  . Marland Kitchenyperlipidemia   . Hypertension   . Left bundle branch block (LBBB) 12/16/2016  . Lung cancer ([facility]   non-small cell lung ca, stage III in 05/2011; systemic chemotherapy concurrent with radiation followed by prophylactic cranial irradiation and has been observation since July of 2010 with no evidence for disease recurrence-released from onc f/u 12/2014 (needs annual cxr by PCP).  CXR 08/2015 stable.  . Mild cognitive impairment with memory loss    Likely from brain radiation therapy  . Non-obstructive CAD (coronary artery disease)    a. Cath 2006 preserved LV fxn, scattered irregularities without critical stenosis; b. 2008 stress echo negative for ischemia, but with hypertensive response; c. 08/2016 Cath: D1 25%, otw nl.  . Normocytic anemia 2018   Iron, vit B12, folate all normal 12/2016.  . Marland Kitchenpen-angle glaucoma 2016   Dr. BeShirley Muscat(bilateral)---responding to topical therapy  . Osteopenia 03/2016   03/2016 DEXA T-score -2.2  . Pneumonia    hospitalization 11/2016; hypoxemic resp failure--d/c'd home with home oxygen therapy  . Subclinical  hyperthyroidism 2018  . Takotsubo cardiomyopathy    a. 08/2016 Echo: EF 30-35%, gr1DD, PASP 32mHg;  b. 08/2016 Cath: nonobs Dzs;  c. 11/2016 Echo: EF 40-45%, no rwma, nl RV fxn, mild TR, PASP 319mg.  . Torsades de pointes ([place]CP[place]11/2017   a. 08/2016 in setting of diverticulitis & pneumoperitoneum and Takotsubo CM -->prolonged QT, seen by EP-->avoid meds with potential for QT prolongation.    Past Surgical  History:  Procedure Laterality Date  . ABDOMINAL HYSTERECTOMY  1997  . APPENDECTOMY    . CARDIAC CATHETERIZATION N/A 09/14/2016   Procedure: Left Heart Cath and Coronary Angiography;  Surgeon: ThTroy SineMD;  [facility];  Service: Cardiovascular;  Laterality: N/A;  . CARDIAC CATHETERIZATION  09/14/2016   EF 25%, akinesis in a pattern of Takutsubos CM.  . Marland KitchenARPAL TUNNEL RELEASE    . CATARACT EXTRACTION    . CHOLECYSTECTOMY  2002  . COLONOSCOPY  10/24/00   normal.  [employer] hemoccult testing via LaAnton Ruiz/25/16 was NEG  . IR GENERIC HISTORICAL  09/19/2016   IR SINUS/FIST TUBE CHK-NON GI 09/19/2016 JaCorrie MckusickDO [facility]  . IR GENERIC HISTORICAL  10/06/2016   IR RADIOLOGIST EVAL & MGMT 10/06/2016 [facility]  . IR GENERIC HISTORICAL  10/26/2016   IR RADIOLOGIST EVAL & MGMT 10/26/2016 [facility]  . IR GENERIC HISTORICAL  11/03/2016   IR RADIOLOGIST EVAL & MGMT 11/03/2016 WeArdis RowanPA-C [facility]  . IR GENERIC HISTORICAL  11/24/2016   IR RADIOLOGIST EVAL & MGMT 11/24/2016 WeArdis RowanPA-C [facility]  . IR GENERIC HISTORICAL  12/05/2016   Fistula smaller/improving.  IR SINUS/FIST TUBE CHK-NON GI 12/05/2016 JoSandi MariscalMD [facility]  . IR GENERIC HISTORICAL  12/22/2016   IR RADIOLOGIST EVAL & MGMT 12/22/2016 MiGreggory KeenMD [facility]  . LEFT HEART CATHETERIZATION WITH CORONARY ANGIOGRAM N/A 05/30/2012   Procedure: LEFT HEART CATHETERIZATION WITH CORONARY ANGIOGRAM;  Surgeon: ChBurnell BlanksMD;  [facility];  Service: Cardiovascular;  Laterality: N/A;  . TRANSTHORACIC ECHOCARDIOGRAM  09/14/2016; 11/2016   08/2016: EF 30-35 %, Akinesis of the mid-apicalanteroseptal myocardium.  Grade I DD.  Mild pulm HTN.  Repeat echo 11/2016, EF 40-45%, no rwma, nl RV fxn, mild TR, PASP 3756m.    Allergies: Lactose intolerance (gi) and Ibuprofen  Medications: Prior to Admission medications   Medication Sig Start Date  End Date Taking? Authorizing Provider  acetaminophen (TYLENOL) 500 MG tablet Take 500-1,000 mg by mouth every 6 (six) hours as needed for headache (or pain).     Historical Provider, MD  albuterol (PROAIR HFA) 108 (90 Base) MCG/ACT inhaler Inhale 2 puffs into the lungs every 6 (six) hours as needed for wheezing or shortness of breath.    Historical Provider, MD  albuterol (PROVENTIL) (2.5 MG/3ML) 0.083% nebulizer solution USE ONE VIAL IN NEBULIZER EVERY 4 HOURS AS NEEDED FOR WHEEZING OR SHORTNESS OF BREATH 12/23/16   PhiTammi SouD  aspirin 81 MG chewable tablet Chew 81 mg by mouth daily.    Historical Provider, MD  atorvastatin (LIPITOR) 80 MG tablet TAKE ONE TABLET BY MOUTH ONCE DAILY Patient taking differently: TAKE ONE TABLET BY MOUTH ONCE DAILY AT BEDTIME 07/18/16   PhiTammi SouD  cetirizine (ZYRTEC) 10 MG tablet Take 10 mg by mouth daily.    Historical Provider, MD  clopidogrel (PLAVIX) 75 MG tablet Take 1 tablet (75 mg total) by mouth daily.  11/21/16   [name], MD  FLOVENT HFA 220 MCG/ACT inhaler INHALE TWO PUFFS BY MOUTH TWICE DAILY 01/12/16   [name], MD  fluticasone (FLONASE) 50 MCG/ACT nasal spray Place 2 sprays into both nostrils at bedtime. 06/25/14   [name], MD  furosemide (LASIX) 40 MG tablet Take 1 tablet (40 mg total) by mouth daily. 01/02/17   [name], MD  HUMULIN 70/30 KWIKPEN (70-30) 100 UNIT/ML PEN Per sliding scale, takes this qAM and aPM:  140-199 - 2 units, 200-250 - 4 units, 251-299 - 6 units,  300-349 - 8 units,  350 or above 10 units. Dispense syringes and needles as needed, Ok to switch to PEN if approved. Substitute to any brand approved. DX DM2, Code E11.65 09/05/16   Historical Provider, MD  loratadine (CLARITIN) 10 MG tablet Take 10 mg by mouth at bedtime.     Historical Provider, MD  metFORMIN (GLUCOPHAGE) 500 MG tablet Take 500 mg by mouth 2 (two) times daily with a meal.    Historical Provider, MD  nitroGLYCERIN (NITROSTAT)  0.4 MG SL tablet Place 1 tablet (0.4 mg total) under the tongue every 5 (five) minutes as needed. For chest pain 12/31/12   Lisabeth Pick, MD  pantoprazole (PROTONIX) 40 MG tablet TAKE ONE TABLET BY MOUTH ONCE DAILY Patient not taking: Reported on 12/27/2016 10/15/15   [name], MD  potassium chloride (K-DUR) 10 MEQ tablet Take 1 tablet (10 mEq total) by mouth daily. 01/02/17   [name], MD     Family History  Problem Relation Age of Onset  . Heart failure Mother   . Kidney disease Mother     renal failure  . Diabetes Mother   . Diabetes Father   . Diabetes Brother   . Heart disease Brother   . Heart attack Brother   . Heart attack Brother   . Heart attack Brother   . Colon cancer Neg Hx     Social History   Social History  . Marital status: Married    Spouse name: N/A  . Number of children: N/A  . Years of education: N/A   Occupational History  . Retired Systems analyst Unemployed   Social History Main Topics  . Smoking status: Former Smoker    Packs/day: 1.00    Years: 45.00    Types: Cigarettes    Quit date: 10/24/2008  . Smokeless tobacco: Never Used  . Alcohol use No  . Drug use: No  . Sexual activity: Not on file   Other Topics Concern  . Not on file   Social History Narrative   Lives in [place], has husband and daughter and son. Daughter's family is living with her and husband at present. She is ambulatory daily without cane or walker.     Vital Signs: There were no vitals taken for this visit.  Physical Exam  NAD, alert Abd:  LLQ drain in place, <55m of odorous, tan fluid in bag, Insertion site c/d/I, skin stitch no longer in place.  No tenderness Skin:  Excoriations around her lower abdomen/pelvic region.  Imaging: No results found.  Labs:  CBC:  Recent Labs  12/06/16 0659 12/08/16 0426 12/09/16 0452 12/27/16 1201  WBC 9.5 6.9 8.5 7.2  HGB 9.4* 9.3* 9.9* 11.0*  HCT 29.1* 29.8* 31.3* 32.7*  PLT 192 236 231 205.0     COAGS:  Recent Labs  06/14/16 2032 09/12/16 0422 09/15/16 0450 09/21/16 0815 12/01/16 2204  INR 1.20 1.34 1.29 1.12 1.33  APTT 34  --  26  --  30    BMP:  Recent Labs  12/06/16 0659 12/07/16 0625 12/08/16 0426 12/09/16 0452 12/27/16 1201  NA 138 139 140 136 143  K 4.0 3.4* 3.8 3.7 3.6  CL 108 102 102 99* 104  CO2 '22 25 27 27 '$ 33*  GLUCOSE 123* 103* 110* 101* 148*  BUN '9 11 15 19 10  '$ CALCIUM 8.5* 8.6* 8.5* 8.6* 8.8  CREATININE 0.85 0.81 0.79 0.81 0.79  GFRNONAA >60 >60 >60 >60  --   GFRAA >60 >60 >60 >60  --     LIVER FUNCTION TESTS:  Recent Labs  11/04/16 0921 12/01/16 1946 12/03/16 0259 12/06/16 0659  BILITOT 0.5 0.8 0.5 0.5  AST '13 20 16 20  '$ ALT 14 15 12* 15  ALKPHOS 137* 93 79 63  PROT 6.5 5.8* 5.3* 5.8*  ALBUMIN 4.0 3.0* 2.3* 2.4*    Assessment: LLQ diverticular abscess drain placed 09/12/17 now with persistent fistula Patient presents to clinic today for follow-up of her diverticular drain.  She has been seen consistently, every 2 weeks, since the middle of December for drain injections and follow-up of her fistula.   Drain injection today shows fistula is till present.   She does not have scheduled follow-up with surgery as she was orginally determined to be a high risk candidate for surgery at the time of her diverticular perforation.  She has follow-up scheduled with cardiology later this week and plans to revisit the idea of possible surgery.  Plan for now is for drain to remain in place.  Her skin stitch was removed to re-position drain and has since been in good position secured with a stat-lock.  Stat-lock is replaced today.  Will plan to see patient in drain clinic in 1 month for injection and re-evaluation of her fistula.  She can continue with no flushes and is encouraged to continue to record her drain output.  Son and patient updated on plan.  They agree and verbalize understanding.   Signed: Docia Barrier 01/10/2017,  10:41 AM   Please refer to Dr. Barbie Banner attestation of this note for management and plan.

## 2017-01-12 ENCOUNTER — Encounter: Payer: Self-pay | Admitting: Cardiology

## 2017-01-12 ENCOUNTER — Ambulatory Visit (INDEPENDENT_AMBULATORY_CARE_PROVIDER_SITE_OTHER): Payer: Medicare Other | Admitting: Cardiology

## 2017-01-12 VITALS — BP 107/63 | Ht 61.5 in | Wt 143.0 lb

## 2017-01-12 DIAGNOSIS — Z01810 Encounter for preprocedural cardiovascular examination: Secondary | ICD-10-CM

## 2017-01-12 DIAGNOSIS — I4901 Ventricular fibrillation: Secondary | ICD-10-CM

## 2017-01-12 DIAGNOSIS — I10 Essential (primary) hypertension: Secondary | ICD-10-CM | POA: Diagnosis not present

## 2017-01-12 DIAGNOSIS — I5181 Takotsubo syndrome: Secondary | ICD-10-CM

## 2017-01-12 DIAGNOSIS — I2510 Atherosclerotic heart disease of native coronary artery without angina pectoris: Secondary | ICD-10-CM

## 2017-01-12 DIAGNOSIS — E7849 Other hyperlipidemia: Secondary | ICD-10-CM

## 2017-01-12 DIAGNOSIS — I469 Cardiac arrest, cause unspecified: Secondary | ICD-10-CM | POA: Diagnosis not present

## 2017-01-12 DIAGNOSIS — E784 Other hyperlipidemia: Secondary | ICD-10-CM | POA: Diagnosis not present

## 2017-01-12 MED ORDER — METOPROLOL TARTRATE 25 MG PO TABS: ORAL_TABLET | ORAL | 3 refills | Status: DC

## 2017-01-12 NOTE — Patient Instructions (Signed)
Medication Instructions: START Metoprolol 25 mg---Take 1/2 tablet twice daily for two weeks; Then increase to 1 tablet twice daily.   Testing/Procedures: Schedule an echocardiogram for May 2018.  Follow-Up: Your physician recommends that you schedule a follow-up appointment in: June 2018 with Dr. Ellyn Hack.   Any Other Special Instructions will be listed beolw:  Your physician has requested that you have an echocardiogram. Echocardiography is a painless test that uses sound waves to create images of your heart. It provides your doctor with information about the size and shape of your heart and how well your heart's chambers and valves are working. This procedure takes approximately one hour. There are no restrictions for this procedure. This will be performed at [address].   If you need a refill on your cardiac medications before your next appointment, please call your pharmacy.

## 2017-01-12 NOTE — Progress Notes (Signed)
PCP: [name], MD  Clinic Note: Chief Complaint  Patient presents with  . Follow-up    1 month; Takotsubo CM; Pre-op evaluation    HPI: [name] is a 71 y.o. female with a PMH below who presents today for One-month follow-up after seeing Rachael Hodgkins, NP for follow-up Takotsubo cardio myopathy. She has a history of nonobstructive CAD, hypertension, hyperlipidemia and type 2 diabetes mellitus along with COPD she also has non-small cell lung cancer status post chemoradiation. Was recently hospitalized for pneumonia and sepsis. IN Nov - had Cardiac Arrest - CPR Recent hospitalizations:   Nov 2017: admitted with diverticulitis & pneumoperitoneum / abscess s/p drain --> complicated by sepsis.  Had significant pain & had an episode of Torsades -- CPR & Defb x 2.  Initial EF by Echo was ~30-35% --> urgent cath revealed non-occlusive CAD = Takotsubo CM.  EP eval - thought to be related to meds that prolong QT interval -- no LifeVest.  [facility] f/u with Almyra Deforest, PA: doing somewhat better. - ACE-I & BB used   12/01/2016 for sepsis. Related to PNA. Has AMA, diarrhea --> Echo showed improved EF.  Studies Reviewed:   2-D echocardiogram 09/14/2016: Severely reduced EF of 30-35% and anteroseptal hypokinesis. Distal anteroseptal akinesis.-- Suggestive of Takotsubo cardiomyopathy  2-D echocardiogram 02/09-16/2018: EF 40-45% with no regional wall motion abnormalities. Improved EF from 30-35% with it was akinesis of the anteroseptal and apical walls. Mild effusion without tamponade..  Cardiac catheterization 09/14/2016: Mild nonobstructive disease  [name] was last seen on 12/15/2016 by Rachael Hodgkins, NP.  Noted that she was slowly starting ot reagin strength.  Chronic DOE & wheezing. Not checking home weights, but no PND/orthophnea. No notable Sx to suggest recurrent arrhythmia.  Interval History: [name] presents today mostly complaining about chest wall  discomfort. She is lying down flat without any dyspnea. She says her chest is hurting her Synthroid to CPR. Otherwise he is able to walk around doing whatever she wants to do walking around the house. No major issues of dyspnea or chest pain. No real PND, orthopnea or edema. Sliding she is eagerly awaiting having the drain removed from her peritoneal abscess  Her chest pain is essentially persistent pain from rib injury and not anginal type pain. She has not had any symptoms to suggest recurrence of arrhythmia. No sick. Near-syncope, TIA or amaurosis fugax.  No melena, hematochezia, hematuria, or epstaxis. No claudication.  ROS: A comprehensive was performed. ROS   Past Medical History:  Diagnosis Date  . Age-related nuclear cataract of both eyes 2016   +cortical age related cataracts OU   . Allergy    seasonal  . Arthritis    hands  . Bilateral diabetic retinopathy (HCC) 2015   Dr. Zigmund Daniel  . Blood transfusion without reported diagnosis    when taking chemo  . Chronic combined systolic and diastolic congestive heart failure (HCC)    Takotsubo CM:  a. 08/2016 Echo: EF 30-35%, gr1DD, PASP 20mHg;  b. 11/2016 Echo: EF 40-45%, no rwma, nl RV fxn, mild TR, PASP 375mg.  . CKD (chronic kidney disease), stage II    GFR 60s  . COPD (chronic obstructive pulmonary disease) ([facility]  . CVA (cerebral vascular accident) ([facility]/2017   MRI did show a left-sided small ischemic stroke which was acute but likely incidental (MRI was done b/c pt had TIA sx's in hospital).  Neuro put her on plavix at that time.  . Diabetes mellitus with  complication ([place])    diab retinopathy OU (laser)  . Diverticulitis 05/2016  . GERD (gastroesophageal reflux disease)    protonix  . History of diverticulitis of colon    with abscess; required IR percutaneous drain placed 09/2016.  [name] Hyperlipidemia   . Hypertension   . Left bundle branch block (LBBB) 12/16/2016  . Lung cancer ([place])    non-small cell lung ca,  stage III in 05/2011; systemic chemotherapy concurrent with radiation followed by prophylactic cranial irradiation and has been observation since July of 2010 with no evidence for disease recurrence-released from onc f/u 12/2014 (needs annual cxr by PCP).  CXR 08/2015 stable.  . Mild cognitive impairment with memory loss    Likely from brain radiation therapy  . Non-obstructive CAD (coronary artery disease)    a. Cath 2006 preserved LV fxn, scattered irregularities without critical stenosis; b. 2008 stress echo negative for ischemia, but with hypertensive response; c. 08/2016 Cath: D1 25%, otw nl.  . Normocytic anemia 2018   Iron, vit B12, folate all normal 12/2016.  [name] Open-angle glaucoma 2016   Dr. Shirley Muscat, (bilateral)---responding to topical therapy  . Osteopenia 03/2016   03/2016 DEXA T-score -2.2  . Pneumonia    hospitalization 11/2016; hypoxemic resp failure--d/c'd home with home oxygen therapy  . Subclinical hyperthyroidism 2018  . Takotsubo cardiomyopathy    a. 08/2016 Echo: EF 30-35%, gr1DD, PASP 20mHg;  b. 08/2016 Cath: nonobs Dzs;  c. 11/2016 Echo: EF 40-45%, no rwma, nl RV fxn, mild TR, PASP 379mg.  . 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 08/2016 in setting of diverticulitis & pneumoperitoneum and Takotsubo CM -->prolonged QT, seen by EP-->avoid meds with potential for QT prolongation.    Past Surgical History:  Procedure Laterality Date  . ABDOMINAL HYSTERECTOMY  1997  . APPENDECTOMY    . CARDIAC CATHETERIZATION N/A 09/14/2016   Procedure: Left Heart Cath and Coronary Angiography;  Surgeon: ThTroy SineMD;  [facility];  Service: Cardiovascular; Mild nonobstructive CAD with 85% smooth narrowing in the first diagonal branch of the LAD; 10% smooth narrowing of the ostial proximal left circumflex coronary artery; and a normal dominant RCA.  . Rachael KitchenARPAL TUNNEL RELEASE    . CATARACT EXTRACTION    . CHOLECYSTECTOMY  2002  . COLONOSCOPY  10/24/00   normal.  [facility]  hemoccult testing via [place]/25/16 was NEG  . IR GENERIC HISTORICAL  09/19/2016   IR SINUS/FIST TUBE CHK-NON GI 09/19/2016 JaCorrie MckusickDO [facility]  . IR GENERIC HISTORICAL  10/06/2016   IR RADIOLOGIST EVAL & MGMT 10/06/2016 [facility]  . IR GENERIC HISTORICAL  10/26/2016   IR RADIOLOGIST EVAL & MGMT 10/26/2016 [facility]  . IR GENERIC HISTORICAL  11/03/2016   IR RADIOLOGIST EVAL & MGMT 11/03/2016 WeArdis RowanPA-C [facility] INTER[facility]  . IR GENERIC HISTORICAL  11/24/2016   IR RADIOLOGIST EVAL & MGMT 11/24/2016 WeArdis RowanPA-C [facility]  . IR GENERIC HISTORICAL  12/05/2016   Fistula smaller/improving.  IR SINUS/FIST TUBE CHK-NON GI 12/05/2016 JoSandi MariscalMD [facility]INTER[facility]  . IR GENERIC HISTORICAL  12/22/2016   IR RADIOLOGIST EVAL & MGMT 12/22/2016 MiGreggory KeenMD [facility]  . LEFT HEART CATHETERIZATION WITH CORONARY ANGIOGRAM N/A 05/30/2012   Procedure: LEFT HEART CATHETERIZATION WITH CORONARY ANGIOGRAM;  Surgeon: ChBurnell BlanksMD;  [facility];  Service: Cardiovascular: Mild, non-obstructive CAD  . TRANSTHORACIC ECHOCARDIOGRAM  09/14/2016; 11/2016   08/2016:  EF 30-35 %, Akinesis of the mid-apicalanteroseptal myocardium.  Grade I DD.  Mild pulm HTN.  Repeat echo 11/2016, EF 40-45%, no rwma, nl RV fxn, mild TR, PASP 41mHg.    Current Meds  Medication Sig  . acetaminophen (TYLENOL) 500 MG tablet Take 500-1,000 mg by mouth every 6 (six) hours as needed for headache (or pain).   .Rachael Kitchenalbuterol (PROAIR HFA) 108 (90 Base) MCG/ACT inhaler Inhale 2 puffs into the lungs every 6 (six) hours as needed for wheezing or shortness of breath.  .Rachael Kitchenalbuterol (PROVENTIL) (2.5 MG/3ML) 0.083% nebulizer solution USE ONE VIAL IN NEBULIZER EVERY 4 HOURS AS NEEDED FOR WHEEZING OR SHORTNESS OF BREATH  . aspirin 81 MG chewable tablet Chew 81 mg by mouth daily.  .Rachael Kitchenatorvastatin (LIPITOR) 80 MG tablet TAKE ONE TABLET BY MOUTH ONCE DAILY (Patient taking differently:  TAKE ONE TABLET BY MOUTH ONCE DAILY AT BEDTIME)  . cetirizine (ZYRTEC) 10 MG tablet Take 10 mg by mouth daily.  . clopidogrel (PLAVIX) 75 MG tablet Take 1 tablet (75 mg total) by mouth daily.  .Rachael KitchenFLOVENT HFA 220 MCG/ACT inhaler INHALE TWO PUFFS BY MOUTH TWICE DAILY  . fluticasone (FLONASE) 50 MCG/ACT nasal spray Place 2 sprays into both nostrils at bedtime.  . furosemide (LASIX) 40 MG tablet Take 1 tablet (40 mg total) by mouth daily.  .Rachael KitchenHUMULIN 70/30 KWIKPEN (70-30) 100 UNIT/ML PEN Per sliding scale, takes this qAM and aPM:  140-199 - 2 units, 200-250 - 4 units, 251-299 - 6 units,  300-349 - 8 units,  350 or above 10 units. Dispense syringes and needles as needed, Ok to switch to PEN if approved. Substitute to any brand approved. DX DM2, Code E11.65  . loratadine (CLARITIN) 10 MG tablet Take 10 mg by mouth at bedtime.   . metFORMIN (GLUCOPHAGE) 500 MG tablet Take 500 mg by mouth 2 (two) times daily with a meal.  . nitroGLYCERIN (NITROSTAT) 0.4 MG SL tablet Place 1 tablet (0.4 mg total) under the tongue every 5 (five) minutes as needed. For chest pain  . pantoprazole (PROTONIX) 40 MG tablet TAKE ONE TABLET BY MOUTH ONCE DAILY  . potassium chloride (K-DUR) 10 MEQ tablet Take 1 tablet (10 mEq total) by mouth daily.    Allergies  Allergen Reactions  . Lactose Intolerance (Gi) Diarrhea  . Ibuprofen Other (See Comments)    REACTION: Anxiousness, hyperventilates    Social History   Social History  . Marital status: Married    Spouse name: N/A  . Number of children: N/A  . Years of education: N/A   Occupational History  . Retired CSystems analystUnemployed   Social History Main Topics  . Smoking status: Former Smoker    Packs/day: 1.00    Years: 45.00    Types: Cigarettes    Quit date: 10/24/2008  . Smokeless tobacco: Never Used  . Alcohol use No  . Drug use: No  . Sexual activity: Not Asked   Other Topics Concern  . None   Social History Narrative   [place] and daughter and son. Daughter's family is living with her and husband at present. She is ambulatory daily without cane or walker.     family history includes Diabetes in her brother, father, and mother; Heart attack in her brother, brother, and brother; Heart disease in her brother; Heart failure in her mother; Kidney disease in her mother.  Wt Readings from Last 3 Encounters:  01/12/17 64.9 kg (143 lb)  12/27/16 64.9 kg (143 lb)  12/20/16 64 kg (141 lb)    PHYSICAL EXAM BP 107/63   Ht 5' 1.5" (1.562 m)   Wt 64.9 kg (143 lb)   BMI 26.58 kg/m ; pulse rate is roughly 105 General appearance: alert, cooperative, appears stated age.  Appears a bit chronically ill. Was lying down flat when I came in - noted significant chest pain from CPR/ rib injuries. Neck: no adenopathy, no carotid bruit and no JVD Lungs: clear to auscultation bilaterally - with mild scattered rhonchi.  Normal percussion bilaterally and non-labored Heart: regular rate and rhythm, S1 & S2 normal, no murmur, click, rub or gallop; nondisplaced PMI; significant chest wall tenderness with wincing pain. She has a peritoneal drain still in place. Abdomen: soft, non-tender; bowel sounds normal; no masses,  no organomegaly; no HJR;  Extremities: extremities normal, atraumatic, no cyanosis, and  trivial edema  Pulses: 2+ and symmetric;  Skin: mobility and turgor normal, no edema and no evidence of bleeding or bruising abdominal wall dressing in place ;  Neurologic: Mental status: Alert, oriented, thought content appropriate    Adult ECG Report Not checked  Other studies Reviewed: Additional studies/ records that were reviewed today include:  Recent Labs:  Lab Results  Component Value Date   CHOL 65 12/02/2016   HDL 34 (L) 12/02/2016   LDLCALC 23 12/02/2016   LDLDIRECT 162.0 07/17/2015   TRIG 42 12/02/2016   CHOLHDL 1.9 12/02/2016    ASSESSMENT / PLAN: Problem List Items Addressed This Visit    Cardiac arrest  with ventricular fibrillation ([place])    No further episodes since her initial hospitalization back in November 2017. This was thought to be related to medications or prolong her QT interval. Hopefully with improved EF this will be less likely. I have started him beta blocker for cardioprotective nature.      Relevant Medications   metoprolol tartrate (LOPRESSOR) 25 MG tablet   Essential hypertension   Relevant Medications   metoprolol tartrate (LOPRESSOR) 25 MG tablet   Hyperlipidemia    She is on high-dose statin. I'm not completely sure as to why this is continued its to high-dose, but her last lipid panel looked very well controlled.      Relevant Medications   metoprolol tartrate (LOPRESSOR) 25 MG tablet   Non-occlusive coronary artery disease    Medically, no anginal symptoms. He did not have much the way of any significant disease, but is on now beta blocker-started. She takes aspirin and Plavix for reasons unknown 100% sure both these could be held for any surgery. She is on atorvastatin with labs monitored by PCP.      Relevant Medications   metoprolol tartrate (LOPRESSOR) 25 MG tablet   Preoperative cardiovascular examination    She has an upcoming procedure I guess that would be to remove the drain from her abdomen, but was told that she needs to have cardiac clearance. I suspect that this is not clearly an open operation. Close operation procedure with simply be removing the drain which case a severe low risk procedure. An open operation for exploratory surgery would be higher risk simply because it is intraperitoneal. Not have active coronary disease. She does not have active heart failure although she doesn't overdo reduced EF. She does have diabetes, but is not on insulin, and has normal renal function. As such, she should not be high risk for any noncardiac procedure. The episode of torsades 1 is somewhat concerning, therefore I would continue  beta blocker. It would be fine  for her to hold aspirin and Plavix if necessary.      Takotsubo cardiomyopathy - Primary (Chronic)    Summary since some signs of improvement and her EF from her initial acute illness in November when compared to her recent illness in February. This was evaluated and found to be nonischemic in nature. However was competent with an episode of torsades during CPR and defibrillation. Thankfully her repeat echocardiogram shows improved EF. She is not really showing signs of heart failure on a very low dose of torsemide. Mostly because of her low blood pressure she has not been on any medications other than diuretic. She is tachycardic today, and light of her likely upcoming procedure, we will start low-dose beta blocker (Toprol.  - We will plan to recheck an echocardiogram in May timeframe which will be 3 months from the February echo. Hopefully her EF will improve.      Relevant Medications   metoprolol tartrate (LOPRESSOR) 25 MG tablet   Other Relevant Orders   ECHOCARDIOGRAM COMPLETE      Current medicines are reviewed at length with the patient today. (+/- concerns) n/a The following changes have been made:  - see below  Patient Instructions  Medication Instructions: START Metoprolol 25 mg---Take 1/2 tablet twice daily for two weeks; Then increase to 1 tablet twice daily.   Testing/Procedures: Schedule an echocardiogram for May 2018.  Follow-Up: Your physician recommends that you schedule a follow-up appointment in: June 2018 with Dr. Ellyn Hack.   Any Other Special Instructions will be listed beolw:  Your physician has requested that you have an echocardiogram. Echocardiography is a painless test that uses sound waves to create images of your heart. It provides your doctor with information about the size and shape of your heart and how well your heart's chambers and valves are working. This procedure takes approximately one hour. There are no restrictions for this procedure. This  will be performed at [address].   If you need a refill on your cardiac medications before your next appointment, please call your pharmacy.    Studies Ordered:   Orders Placed This Encounter  Procedures  . ECHOCARDIOGRAM COMPLETE      Glenetta Hew, M.D., M.S. Interventional Cardiologist   Pager # [phone] Phone # [phone] [address] [place], [place] [address]

## 2017-01-14 ENCOUNTER — Encounter: Payer: Self-pay | Admitting: Cardiology

## 2017-01-14 NOTE — Assessment & Plan Note (Signed)
Medically, no anginal symptoms. He did not have much the way of any significant disease, but is on now beta blocker-started. She takes aspirin and Plavix for reasons unknown 100% sure both these could be held for any surgery. She is on atorvastatin with labs monitored by PCP.

## 2017-01-14 NOTE — Assessment & Plan Note (Signed)
No further episodes since her initial hospitalization back in November 2017. This was thought to be related to medications or prolong her QT interval. Hopefully with improved EF this will be less likely. I have started him beta blocker for cardioprotective nature.

## 2017-01-14 NOTE — Assessment & Plan Note (Signed)
She is on high-dose statin. I'm not completely sure as to why this is continued its to high-dose, but her last lipid panel looked very well controlled.

## 2017-01-14 NOTE — Assessment & Plan Note (Addendum)
Summary since some signs of improvement and her EF from her initial acute illness in November when compared to her recent illness in February. This was evaluated and found to be nonischemic in nature. However was competent with an episode of torsades during CPR and defibrillation. Thankfully her repeat echocardiogram shows improved EF. She is not really showing signs of heart failure on a very low dose of torsemide. Mostly because of her low blood pressure she has not been on any medications other than diuretic. She is tachycardic today, and light of her likely upcoming procedure, we will start low-dose beta blocker (Toprol.  - We will plan to recheck an echocardiogram in May timeframe which will be 3 months from the February echo. Hopefully her EF will improve.

## 2017-01-14 NOTE — Assessment & Plan Note (Signed)
She has an upcoming procedure I guess that would be to remove the drain from her abdomen, but was told that she needs to have cardiac clearance. I suspect that this is not clearly an open operation. Close operation procedure with simply be removing the drain which case a severe low risk procedure. An open operation for exploratory surgery would be higher risk simply because it is intraperitoneal. Not have active coronary disease. She does not have active heart failure although she doesn't overdo reduced EF. She does have diabetes, but is not on insulin, and has normal renal function. As such, she should not be high risk for any noncardiac procedure. The episode of torsades 1 is somewhat concerning, therefore I would continue beta blocker. It would be fine for her to hold aspirin and Plavix if necessary.

## 2017-01-16 ENCOUNTER — Encounter: Payer: Self-pay | Admitting: Family Medicine

## 2017-01-16 ENCOUNTER — Telehealth: Payer: Self-pay | Admitting: Family Medicine

## 2017-01-16 MED ORDER — PANTOPRAZOLE SODIUM 40 MG PO TBEC: 40.0000 mg | DELAYED_RELEASE_TABLET | Freq: Every day | ORAL | 6 refills | Status: DC

## 2017-01-16 NOTE — Telephone Encounter (Signed)
Yes, ok to RF.--thx

## 2017-01-16 NOTE — Telephone Encounter (Signed)
Rx sent to pharmacy # [phone] x 6 rfs.

## 2017-01-16 NOTE — Telephone Encounter (Signed)
Patient's husband aware.  Okay per DPR.

## 2017-01-16 NOTE — Telephone Encounter (Signed)
Okay to fill? Please advise 

## 2017-01-16 NOTE — Telephone Encounter (Signed)
Patient's husband called to see if Dr. Anitra Lauth could refill rx pantoprazole (PROTONIX) 40 MG tablet Dr. Seward Carol wrote rx originally. Please call patient's husband to advise. Okay to leave detailed message on phone.

## 2017-01-18 ENCOUNTER — Other Ambulatory Visit: Payer: Self-pay | Admitting: *Deleted

## 2017-01-18 MED ORDER — HUMULIN 70/30 KWIKPEN (70-30) 100 UNIT/ML ~~LOC~~ SUPN: PEN_INJECTOR | SUBCUTANEOUS | 6 refills | Status: DC

## 2017-01-18 NOTE — Telephone Encounter (Signed)
Pt requesting refill for Humulin 70/30.  RF request for Humulin 70/30 LOV: 12/27/16 Next ov: None Last written: unknown  Please advise. Thanks.

## 2017-01-26 ENCOUNTER — Other Ambulatory Visit: Payer: Self-pay | Admitting: Family Medicine

## 2017-01-26 ENCOUNTER — Ambulatory Visit: Payer: Self-pay | Admitting: Cardiology

## 2017-01-26 ENCOUNTER — Encounter: Payer: Self-pay | Admitting: *Deleted

## 2017-01-26 ENCOUNTER — Telehealth: Payer: Self-pay | Admitting: *Deleted

## 2017-01-26 DIAGNOSIS — Z1231 Encounter for screening mammogram for malignant neoplasm of breast: Secondary | ICD-10-CM

## 2017-01-26 MED ORDER — FUROSEMIDE 40 MG PO TABS: 20.0000 mg | ORAL_TABLET | Freq: Every day | ORAL | 6 refills | Status: DC

## 2017-01-26 NOTE — Telephone Encounter (Signed)
Patient and daughter walked into the office today with questions about furosemide. The daughter reports the patient is going to the bathroom all the time and she is having several accidents during the day where she wets her clothes. They requested decreasing her furosemide. The patient does not have any edema and she denies SOB. Her only complaint is chest wall pain from resuscitation. Okay given for patient to cut furosemide to 20 mg once daily, that will be 1/2 of the 40 mg dose daily. Patient voiced understanding of the importance of weighing daily and watching for swelling or SOB. She was advised to try taking tylenol for the chest wall pain. They will call the office with any weight gain, or SOB.

## 2017-01-30 ENCOUNTER — Other Ambulatory Visit (HOSPITAL_COMMUNITY): Payer: Medicare Other

## 2017-02-06 ENCOUNTER — Other Ambulatory Visit: Payer: Self-pay

## 2017-02-06 NOTE — Patient Outreach (Addendum)
[facility]) Care Management  [place]  02/06/2017   [name] [birth year] [id number]  Subjective: client reports she is ready to get the drain taking out.  Objective: none-telephonic call.   Encounter Medications:  Outpatient Encounter Prescriptions as of 02/06/2017  Medication Sig Note  . acetaminophen (TYLENOL) 500 MG tablet Take 500-1,000 mg by mouth every 6 (six) hours as needed for headache (or pain).    Marland Kitchen albuterol (PROAIR HFA) 108 (90 Base) MCG/ACT inhaler Inhale 2 puffs into the lungs every 6 (six) hours as needed for wheezing or shortness of breath.   Marland Kitchen albuterol (PROVENTIL) (2.5 MG/3ML) 0.083% nebulizer solution USE ONE VIAL IN NEBULIZER EVERY 4 HOURS AS NEEDED FOR WHEEZING OR SHORTNESS OF BREATH   . aspirin 81 MG chewable tablet Chew 81 mg by mouth daily.   Marland Kitchen atorvastatin (LIPITOR) 80 MG tablet TAKE ONE TABLET BY MOUTH ONCE DAILY (Patient taking differently: TAKE ONE TABLET BY MOUTH ONCE DAILY AT BEDTIME)   . cetirizine (ZYRTEC) 10 MG tablet Take 10 mg by mouth daily.   . clopidogrel (PLAVIX) 75 MG tablet Take 1 tablet (75 mg total) by mouth daily.   Marland Kitchen FLOVENT HFA 220 MCG/ACT inhaler INHALE TWO PUFFS BY MOUTH TWICE DAILY   . fluticasone (FLONASE) 50 MCG/ACT nasal spray Place 2 sprays into both nostrils at bedtime.   . furosemide (LASIX) 40 MG tablet Take 0.5 tablets (20 mg total) by mouth daily.   Marland Kitchen HUMULIN 70/30 KWIKPEN (70-30) 100 UNIT/ML PEN Per sliding scale, takes this qAM and aPM:  140-199 - 2 units, 200-250 - 4 units, 251-299 - 6 units,  300-349 - 8 units,  350 or above 10 units. Dispense syringes and needles as needed, Ok to switch to PEN if approved. Substitute to any brand approved. DX DM2, Code E11.65   . loratadine (CLARITIN) 10 MG tablet Take 10 mg by mouth at bedtime.    . metFORMIN (GLUCOPHAGE) 500 MG tablet Take 500 mg by mouth 2 (two) times daily with a meal.   . metoprolol tartrate (LOPRESSOR) 25 MG tablet Take 1/2 tablet (12.5  mg) twice daily for two weeks. Then Take 1 tablet (25 mg) twice daily.   . nitroGLYCERIN (NITROSTAT) 0.4 MG SL tablet Place 1 tablet (0.4 mg total) under the tongue every 5 (five) minutes as needed. For chest pain 12/01/2016: Patient carries this, but has never had to use any recently  . pantoprazole (PROTONIX) 40 MG tablet Take 1 tablet (40 mg total) by mouth daily.   . potassium chloride (K-DUR) 10 MEQ tablet Take 1 tablet (10 mEq total) by mouth daily.    No facility-administered encounter medications on file as of 02/06/2017.     Functional Status:  In your present state of health, do you have any difficulty performing the following activities: 02/06/2017 12/02/2016  Hearing? N N  Vision? N N  Difficulty concentrating or making decisions? Y N  Walking or climbing stairs? N Y  Dressing or bathing? N N  Doing errands, shopping? Y N  Preparing Food and eating ? N -  Using the Toilet? N -  In the past six months, have you accidently leaked urine? N -  Do you have problems with loss of bowel control? N -  Managing your Medications? N -  Managing your Finances? (No Data) -  Housekeeping or managing your Housekeeping? N -  Some recent data might be hidden    Fall/Depression Screening: PHQ 2/9 Scores 02/06/2017 10/04/2016 07/05/2016  06/28/2016 04/06/2015 04/06/2015 10/04/2013  PHQ - 2 Score 0 1 0 0 0 0 0   Fall Risk  02/06/2017 12/20/2016 10/04/2016 07/05/2016 06/28/2016  Falls in the past year? Yes No Yes Yes Yes  Number falls in past yr: 1 - 2 or more 1 1  Injury with Fall? No - Yes No Yes  Risk Factor Category  High Fall Risk - High Fall Risk - High Fall Risk  Risk for fall due to : Impaired mobility;History of fall(s) - History of fall(s);Impaired balance/gait;Impaired mobility History of fall(s) History of fall(s)  Follow up Falls prevention discussed Falls prevention discussed Education provided;Falls prevention discussed Falls prevention discussed Falls evaluation completed   Assessment: 71  year old with admission 2/8-2/16 to hospital due to sepsis, upper respiratory failure. History of COPD, HTN, DM, hyperlipidemia, Torsade-de pointes, stroke, diverticulitis, history of heart failure.  Transition of Care program complete.  Continues to have abdominal drain and is followed by interventional radiology. Client states she has an appointment on Thursday of this week.  RNCM called to assess if client has any additional care management needs.   Client reports recent fall when she misjudged the distance of shower chair behind her and fell in the tub. Client denies injury-"just my pride".   Recent decrease of diuretic per cardiologist to help with decrease in urinary frequency. Client reports this has helped.   Client reports weight loss, but is not clear on reason. Per notes weight 143 on March 22, and states she weighs 139.6 today on her home scale.  Plan: RNCM will make home visit. instruct on signs/symptoms of heart failure exacerbation and when to call the doctor.   Thea Silversmith, RN, MSN, [place] Coordinator Cell: [phone]

## 2017-02-09 ENCOUNTER — Ambulatory Visit
Admission: RE | Admit: 2017-02-09 | Discharge: 2017-02-09 | Disposition: A | Discharge status: Home / Self Care | Payer: Medicare Other | Source: Ambulatory Visit | Attending: General Surgery | Admitting: General Surgery

## 2017-02-09 DIAGNOSIS — K5720 Diverticulitis of large intestine with perforation and abscess without bleeding: Secondary | ICD-10-CM

## 2017-02-09 DIAGNOSIS — K5780 Diverticulitis of intestine, part unspecified, with perforation and abscess without bleeding: Secondary | ICD-10-CM | POA: Diagnosis not present

## 2017-02-09 HISTORY — PX: IR RADIOLOGIST EVAL & MGMT: IMG5224

## 2017-02-09 NOTE — Progress Notes (Signed)
Patient ID: [name], female   DOB: 1946/09/07, 71 y.o.   MRN: [id number]   Referring Physician(s): Ramirez,Armando  Chief Complaint: The patient is seen in follow up today s/p LLQ diverticular drain placement on 09/12/16.  History of present illness:  [name] is a 71 yo female who underwent percutaneous abdominal drain placement for a diverticular abscess in November of 2017.  Her first follow up visit demonstrated a colonic fistula.  Her drain has continued to remain in place.  She has had multiple other drain injections through January, February, March, and now April, each showing a persistent fistula.  She nor her husband recall seeing a surgeon, but after calling [facility], she was seen by Dr. [name] on 11-25-16 and sent for cardiac clearance for surgery.  The patient was supposed to follow up after clearance was obtained; however, this has not happened.  She presents today for a 28-monthdrain injection follow up.  She is having minimal output, but her output is malodorous.  She denies any taking any antibiotics, fevers, chills, or abdominal pain.    Past Medical History:  Diagnosis Date  . Age-related nuclear cataract of both eyes 2016   +cortical age related cataracts OU   . Allergy    seasonal  . Arthritis    hands  . Bilateral diabetic retinopathy (HCC) 2015   Dr. MZigmund Daniel . Blood transfusion without reported diagnosis    when taking chemo  . Chronic combined systolic and diastolic congestive heart failure (HCC)    Takotsubo CM:  a. 08/2016 Echo: EF 30-35%, gr1DD, PASP 327mg;  b. 11/2016 Echo: EF 40-45%, no rwma, nl RV fxn, mild TR, PASP 3749m.  . CKD (chronic kidney disease), stage II    GFR 60s  . COPD (chronic obstructive pulmonary disease) ([place]CC[place] . CVA (cerebral vascular accident) ([place]   MRI did show a left-sided small ischemic stroke which was acute but likely incidental (MRI was done b/c pt had TIA sx's in hospital).  Neuro put her on plavix at that time.    . Diabetes mellitus with complication (HCC)    diab retinopathy OU (laser)  . Diverticulitis 05/2016  . GERD (gastroesophageal reflux disease)    protonix  . History of diverticulitis of colon    with abscess; required IR percutaneous drain placed 09/2016.  . HMarland Kitchenperlipidemia   . Hypertension   . Left bundle branch block (LBBB) 12/16/2016  . Lung cancer ([place]  non-small cell lung ca, stage III in 05/2011; systemic chemotherapy concurrent with radiation followed by prophylactic cranial irradiation and has been observation since July of 2010 with no evidence for disease recurrence-released from onc f/u 12/2014 (needs annual cxr by PCP).  CXR 08/2015 stable.  . Mild cognitive impairment with memory loss    Likely from brain radiation therapy  . Non-obstructive CAD (coronary artery disease)    a. Cath 2006 preserved LV fxn, scattered irregularities without critical stenosis; b. 2008 stress echo negative for ischemia, but with hypertensive response; c. 08/2016 Cath: D1 25%, otw nl.  . Normocytic anemia 2018   Iron, vit B12, folate all normal 12/2016.  . OMarland Kitchenen-angle glaucoma 2016   Dr. BerShirley Muscatbilateral)---responding to topical therapy  . Osteopenia 03/2016   03/2016 DEXA T-score -2.2  . Pneumonia    hospitalization 11/2016; hypoxemic resp failure--d/c'd home with home oxygen therapy  . Subclinical hyperthyroidism 2018  . Takotsubo cardiomyopathy    a. 08/2016 Echo: EF 30-35%, gr1DD, PASP 53m29m  b.  08/2016 Cath: nonobs Dzs;  c. 11/2016 Echo: EF 40-45%, no rwma, nl RV fxn, mild TR, PASP 74mHg.  Cardiology plans repeat echo approx May 2018.  . Torsades de pointes ([place] 08/[place]   a. 08/2016 in setting of diverticulitis & pneumoperitoneum and Takotsubo CM -->prolonged QT, seen by EP-->avoid meds with potential for QT prolongation.    Past Surgical History:  Procedure Laterality Date  . ABDOMINAL HYSTERECTOMY  1997  . APPENDECTOMY    . CARDIAC CATHETERIZATION N/A 09/14/2016   Procedure:  Left Heart Cath and Coronary Angiography;  Surgeon: TTroy Sine MD;  [facility];  Service: Cardiovascular; Mild nonobstructive CAD with 85% smooth narrowing in the first diagonal branch of the LAD; 10% smooth narrowing of the ostial proximal left circumflex coronary artery; and a normal dominant RCA.  .Marland KitchenCARPAL TUNNEL RELEASE    . CATARACT EXTRACTION    . CHOLECYSTECTOMY  2002  . COLONOSCOPY  10/24/00   normal.  [facility] [place]cult [place] was NEG  . IR GENERIC HISTORICAL  09/19/2016   IR SINUS/FIST TUBE CHK-NON GI 09/19/2016 JCorrie Mckusick DO [facility]  . IR GENERIC HISTORICAL  10/06/2016   IR RADIOLOGIST EVAL & MGMT 10/06/2016 [facility]  . IR GENERIC HISTORICAL  10/26/2016   IR RADIOLOGIST EVAL & MGMT 10/26/2016 [facility]  . IR GENERIC HISTORICAL  11/03/2016   IR RADIOLOGIST EVAL & MGMT 11/03/2016 WArdis Rowan PA-C [facility]  . IR GENERIC HISTORICAL  11/24/2016   IR RADIOLOGIST EVAL & MGMT 11/24/2016 WArdis Rowan PA-C [facility]  . IR GENERIC HISTORICAL  12/05/2016   Fistula smaller/improving.  IR SINUS/FIST TUBE CHK-NON GI 12/05/2016 JSandi Mariscal MD [facility]  . IR GENERIC HISTORICAL  12/22/2016   IR RADIOLOGIST EVAL & MGMT 12/22/2016 MGreggory Keen MD [facility]  . LEFT HEART CATHETERIZATION WITH CORONARY ANGIOGRAM N/A 05/30/2012   Procedure: LEFT HEART CATHETERIZATION WITH CORONARY ANGIOGRAM;  Surgeon: CBurnell Blanks MD;  [facility];  Service: Cardiovascular: Mild, non-obstructive CAD  . TRANSTHORACIC ECHOCARDIOGRAM  09/14/2016; 11/2016   08/2016: EF 30-35 %, Akinesis of the mid-apicalanteroseptal myocardium.  Grade I DD.  Mild pulm HTN.  Repeat echo 11/2016, EF 40-45%, no rwma, nl RV fxn, mild TR, PASP 392mg.    Allergies: Lactose intolerance (gi) and Ibuprofen  Medications: Prior to Admission medications   Medication Sig Start Date End Date Taking? Authorizing Provider   acetaminophen (TYLENOL) 500 MG tablet Take 500-1,000 mg by mouth every 6 (six) hours as needed for headache (or pain).     Historical Provider, MD  albuterol (PROAIR HFA) 108 (90 Base) MCG/ACT inhaler Inhale 2 puffs into the lungs every 6 (six) hours as needed for wheezing or shortness of breath.    Historical Provider, MD  albuterol (PROVENTIL) (2.5 MG/3ML) 0.083% nebulizer solution USE ONE VIAL IN NEBULIZER EVERY 4 HOURS AS NEEDED FOR WHEEZING OR SHORTNESS OF BREATH 12/23/16   PhTammi SouMD  aspirin 81 MG chewable tablet Chew 81 mg by mouth daily.    Historical Provider, MD  atorvastatin (LIPITOR) 80 MG tablet TAKE ONE TABLET BY MOUTH ONCE DAILY Patient taking differently: TAKE ONE TABLET BY MOUTH ONCE DAILY AT BEDTIME 07/18/16   PhTammi SouMD  cetirizine (ZYRTEC) 10 MG tablet Take 10 mg by mouth daily.    Historical Provider, MD  clopidogrel (PLAVIX) 75 MG tablet Take 1 tablet (75 mg total) by mouth daily.  11/21/16   [name], MD  FLOVENT HFA 220 MCG/ACT inhaler INHALE TWO PUFFS BY MOUTH TWICE DAILY 01/12/16   [name], MD  fluticasone (FLONASE) 50 MCG/ACT nasal spray Place 2 sprays into both nostrils at bedtime. 06/25/14   [name], MD  furosemide (LASIX) 40 MG tablet Take 0.5 tablets (20 mg total) by mouth daily. 01/26/17   Leonie Man, MD  HUMULIN 70/30 KWIKPEN (70-30) 100 UNIT/ML PEN Per sliding scale, takes this qAM and aPM:  140-199 - 2 units, 200-250 - 4 units, 251-299 - 6 units,  300-349 - 8 units,  350 or above 10 units. Dispense syringes and needles as needed, Ok to switch to PEN if approved. Substitute to any brand approved. DX DM2, Code E11.65 01/18/17   [name], MD  loratadine (CLARITIN) 10 MG tablet Take 10 mg by mouth at bedtime.     Historical Provider, MD  metFORMIN (GLUCOPHAGE) 500 MG tablet Take 500 mg by mouth 2 (two) times daily with a meal.    Historical Provider, MD  metoprolol tartrate (LOPRESSOR) 25 MG tablet Take 1/2 tablet (12.5  mg) twice daily for two weeks. Then Take 1 tablet (25 mg) twice daily. 01/12/17   Leonie Man, MD  nitroGLYCERIN (NITROSTAT) 0.4 MG SL tablet Place 1 tablet (0.4 mg total) under the tongue every 5 (five) minutes as needed. For chest pain 12/31/12   Lisabeth Pick, MD  pantoprazole (PROTONIX) 40 MG tablet Take 1 tablet (40 mg total) by mouth daily. 01/16/17   [name], MD  potassium chloride (K-DUR) 10 MEQ tablet Take 1 tablet (10 mEq total) by mouth daily. 01/02/17   [name], MD     Family History  Problem Relation Age of Onset  . Heart failure Mother   . Kidney disease Mother     renal failure  . Diabetes Mother   . Diabetes Father   . Diabetes Brother   . Heart disease Brother   . Heart attack Brother   . Heart attack Brother   . Heart attack Brother   . Colon cancer Neg Hx     Social History   Social History  . Marital status: Married    Spouse name: N/A  . Number of children: N/A  . Years of education: N/A   Occupational History  . Retired Systems analyst Unemployed   Social History Main Topics  . Smoking status: Former Smoker    Packs/day: 1.00    Years: 45.00    Types: Cigarettes    Quit date: 10/24/2008  . Smokeless tobacco: Never Used  . Alcohol use No  . Drug use: No  . Sexual activity: Not on file   Other Topics Concern  . Not on file   Social History Narrative   Lives in [place], has husband and daughter and son. Daughter's family is living with her and husband at present. She is ambulatory daily without cane or walker.      Vital Signs: BP (!) 112/51   Pulse 91   Temp 97.5 F (36.4 C)   SpO2 97%   Physical Exam Gen: NAD Abd: drain in place in RLQ with drain site c/d/I.  Drain bag with minimal thick, malodorous, tan/brown output.  Drain injection shows a persistent fistula from her drain to her colon. Imaging: No results found.  Labs:  CBC:  Recent Labs  12/06/16 0659 12/08/16 0426 12/09/16 0452 12/27/16 1201  WBC  9.5 6.9 8.5  7.2  HGB 9.4* 9.3* 9.9* 11.0*  HCT 29.1* 29.8* 31.3* 32.7*  PLT 192 236 231 205.0    COAGS:  Recent Labs  06/14/16 2032 09/12/16 0422 09/15/16 0450 09/21/16 0815 12/01/16 2204  INR 1.20 1.34 1.29 1.12 1.33  APTT 34  --  26  --  30    BMP:  Recent Labs  12/06/16 0659 12/07/16 0625 12/08/16 0426 12/09/16 0452 12/27/16 1201  NA 138 139 140 136 143  K 4.0 3.4* 3.8 3.7 3.6  CL 108 102 102 99* 104  CO2 '22 25 27 27 '$ 33*  GLUCOSE 123* 103* 110* 101* 148*  BUN '9 11 15 19 10  '$ CALCIUM 8.5* 8.6* 8.5* 8.6* 8.8  CREATININE 0.85 0.81 0.79 0.81 0.79  GFRNONAA >60 >60 >60 >60  --   GFRAA >60 >60 >60 >60  --     LIVER FUNCTION TESTS:  Recent Labs  11/04/16 0921 12/01/16 1946 12/03/16 0259 12/06/16 0659  BILITOT 0.5 0.8 0.5 0.5  AST '13 20 16 20  '$ ALT 14 15 12* 15  ALKPHOS 137* 93 79 63  PROT 6.5 5.8* 5.3* 5.8*  ALBUMIN 4.0 3.0* 2.3* 2.4*    Assessment:  1. Diverticular abscess with colonic fistula  Unfortunately, [name] colonic fistula still persists 5 months after drain placement.  We have contacted [facility] to try and schedule the patient with Dr. [name] for more definitive management as drain therapy is not resolving her issue.  They will contact the patient tomorrow with further information regarding this appointment.    The patient does not need to follow up with IR any longer as there is no further role for injections, etc as this fistula is unlikely to heal without surgical intervention.  The drain will remain in place to avoid a recurrence of abdominal abscess.  She will continue to NOT flush her drain and keep the gravity bag.  The patient and her husband state they both understand this plan.  Signed: Henreitta Cea 02/09/2017, 10:53 AM   Please refer to Dr. Pascal Lux' attestation of this note for management and plan.

## 2017-02-16 ENCOUNTER — Other Ambulatory Visit: Payer: Self-pay

## 2017-02-16 ENCOUNTER — Other Ambulatory Visit: Payer: Self-pay | Admitting: Family Medicine

## 2017-02-16 MED ORDER — ALBUTEROL SULFATE HFA 108 (90 BASE) MCG/ACT IN AERS: 2.0000 | INHALATION_SPRAY | Freq: Four times a day (QID) | RESPIRATORY_TRACT | 3 refills | Status: DC | PRN

## 2017-02-16 MED ORDER — FLUTICASONE PROPIONATE HFA 220 MCG/ACT IN AERO: 2.0000 | INHALATION_SPRAY | Freq: Two times a day (BID) | RESPIRATORY_TRACT | 6 refills | Status: DC

## 2017-02-16 NOTE — Patient Outreach (Signed)
[facility]) Care Management   02/16/2017  [name] [birth year] [id number]  [name] is an 71 y.o. female  Subjective: client reports doing better, she states she just want the drain removed.  Objective:  BP 110/68   Resp 20   Ht 1.575 m ('5\' 2"'$ )   Wt 138 lb 12.8 oz (63 kg)   SpO2 96%   BMI 25.39 kg/m   Review of Systems  Respiratory: Positive for cough.        Decreased breath sounds, faint wheeze noted lower right lobe, cleared after using ProAir inhaler.  Cardiovascular:       Heart rate regular  Skin:       Abdominal drain to abdomen intact. Dressing to abdominal drain intact, clean, dry.     Physical Exam  Encounter Medications:   Outpatient Encounter Prescriptions as of 02/16/2017  Medication Sig Note  . acetaminophen (TYLENOL) 500 MG tablet Take 500-1,000 mg by mouth every 6 (six) hours as needed for headache (or pain).    Marland Kitchen albuterol (PROVENTIL) (2.5 MG/3ML) 0.083% nebulizer solution USE ONE VIAL IN NEBULIZER EVERY 4 HOURS AS NEEDED FOR WHEEZING OR SHORTNESS OF BREATH   . aspirin 81 MG chewable tablet Chew 81 mg by mouth daily.   Marland Kitchen atorvastatin (LIPITOR) 80 MG tablet TAKE ONE TABLET BY MOUTH ONCE DAILY (Patient taking differently: TAKE ONE TABLET BY MOUTH ONCE DAILY AT BEDTIME)   . cetirizine (ZYRTEC) 10 MG tablet Take 10 mg by mouth daily.   . clopidogrel (PLAVIX) 75 MG tablet Take 1 tablet (75 mg total) by mouth daily.   . furosemide (LASIX) 40 MG tablet Take 0.5 tablets (20 mg total) by mouth daily.   Marland Kitchen HUMULIN 70/30 KWIKPEN (70-30) 100 UNIT/ML PEN Per sliding scale, takes this qAM and aPM:  140-199 - 2 units, 200-250 - 4 units, 251-299 - 6 units,  300-349 - 8 units,  350 or above 10 units. Dispense syringes and needles as needed, Ok to switch to PEN if approved. Substitute to any brand approved. DX DM2, Code E11.65   . loratadine (CLARITIN) 10 MG tablet Take 10 mg by mouth at bedtime.    . metFORMIN (GLUCOPHAGE) 500 MG tablet Take 500  mg by mouth 2 (two) times daily with a meal.   . metoprolol tartrate (LOPRESSOR) 25 MG tablet Take 1/2 tablet (12.5 mg) twice daily for two weeks. Then Take 1 tablet (25 mg) twice daily.   . nitroGLYCERIN (NITROSTAT) 0.4 MG SL tablet Place 1 tablet (0.4 mg total) under the tongue every 5 (five) minutes as needed. For chest pain 12/01/2016: Patient carries this, but has never had to use any recently  . pantoprazole (PROTONIX) 40 MG tablet Take 1 tablet (40 mg total) by mouth daily.   . potassium chloride (K-DUR) 10 MEQ tablet Take 1 tablet (10 mEq total) by mouth daily.   . [DISCONTINUED] albuterol (PROAIR HFA) 108 (90 Base) MCG/ACT inhaler Inhale 2 puffs into the lungs every 6 (six) hours as needed for wheezing or shortness of breath.   . fluticasone (FLONASE) 50 MCG/ACT nasal spray Place 2 sprays into both nostrils at bedtime.   . [DISCONTINUED] FLOVENT HFA 220 MCG/ACT inhaler INHALE TWO PUFFS BY MOUTH TWICE DAILY    No facility-administered encounter medications on file as of 02/16/2017.     Functional Status:   In your present state of health, do you have any difficulty performing the following activities: 02/06/2017 12/02/2016  Hearing? N N  Vision?  N N  Difficulty concentrating or making decisions? Y N  Walking or climbing stairs? N Y  Dressing or bathing? N N  Doing errands, shopping? Y N  Preparing Food and eating ? N -  Using the Toilet? N -  In the past six months, have you accidently leaked urine? N -  Do you have problems with loss of bowel control? N -  Managing your Medications? N -  Managing your Finances? (No Data) -  Housekeeping or managing your Housekeeping? N -  Some recent data might be hidden    Fall/Depression Screening:    PHQ 2/9 Scores 02/06/2017 10/04/2016 07/05/2016 06/28/2016 04/06/2015 04/06/2015 10/04/2013  PHQ - 2 Score 0 1 0 0 0 0 0    Assessment:  71 year old with recent admission to hospital due to sepsis, upper respiratory failure. History of COPD, HTN, DM,  hyperlipidemia, Torsade-de pointes, stroke, diverticulitis.  Home visit completed. Client reports doing better, but wants the drain out. Drain managed by interventional radiology. Her daughter is doing the dressing changes. Dressing dry/intact.   History of heart failure. Mrs. [name] reports a recent decrease in diuretic dosage. Mrs. [name] reports she has been weighing self and recording weights. Heart failure education done.   History of COPD. Client reports coughing and states she is using her albuterol nebulizer every day. RNCM reviewed medications and client has not been using Flovent (on medication profile). Mr. [name] who assist with keeping up with client medications is not clear on if client is to be on this medication. RNCM will complete [facility] pharmacy consult for medication reconciliation and education regarding use of COPD medications and other medications as needed.  Plan: continue heart failure education. [facility] Pharm[facility] referral.  Thea Silversmith, RN, MSN, [place] Coordinator Cell: [phone]

## 2017-02-16 NOTE — Telephone Encounter (Signed)
[facility].  RF request for proair LOV: 12/27/16 Next ov: None Last written: 12/16/15 #8m w/ 3RF  RF request for flovent Last written: 01/12/16 #12g w/ 6RF  Please advise. Thanks.

## 2017-02-16 NOTE — Telephone Encounter (Signed)
Patient had home health nurse at their house. Patient told the home health nurse she has not been carrying the [employer] with her in her purse. It has expired, can she get a refill of Proair inhaler sent to [facility]? Patient is unsure if she should keep Flovent at home. Patient states she has not been using it. Patient said the the home health nurse will be calling our office to confirm use of [place].

## 2017-02-16 NOTE — Telephone Encounter (Signed)
Pts husband advised and voiced understanding.

## 2017-02-17 ENCOUNTER — Other Ambulatory Visit: Payer: Self-pay | Admitting: *Deleted

## 2017-02-17 MED ORDER — GLUCOSE BLOOD VI STRP: ORAL_STRIP | 12 refills | Status: DC

## 2017-02-17 NOTE — Telephone Encounter (Signed)
[facility].  Refill request for DM supplies, onetouch ultra blue test strips. Dx: E11.65

## 2017-02-22 ENCOUNTER — Other Ambulatory Visit: Payer: Self-pay | Admitting: Pharmacist

## 2017-02-22 NOTE — Patient Outreach (Signed)
[facility]) Care Management  02/22/2017  [name] 09-26-1946 [id number]   Patient was called regarding medication management post recent hospitalization. No answer. HIPAA compliant message left on patient's voicemail.  Plan:  Call patient back within 5-7 business days.  [name], PharmD, [place] Clinical Pharmacist [phone]

## 2017-02-23 ENCOUNTER — Ambulatory Visit (INDEPENDENT_AMBULATORY_CARE_PROVIDER_SITE_OTHER): Payer: Self-pay | Admitting: Ophthalmology

## 2017-02-27 ENCOUNTER — Other Ambulatory Visit: Payer: Self-pay | Admitting: Pharmacist

## 2017-02-27 DIAGNOSIS — K632 Fistula of intestine: Secondary | ICD-10-CM | POA: Diagnosis not present

## 2017-02-27 NOTE — Patient Outreach (Signed)
[facility]) Care Management  02/27/2017  [name] 11/16/1945 [id number]   Patient was called regarding medication adherence per referral from [place], Thea Silversmith.  HIPAA identifiers were obtained.  Patient is a 71 year old female with multiple medical conditions including but not limited to:  CAD, hypertension, CHF, COPS, GERD, history of CVA, type 2 diabetes, and hyperlipidemia.  Patient reported her daughter fills her pill box. Patient also stated she did not need help with her medications and did not want to review them with me via telephone.  When asked specifically about Flovent, patient reported using it as prescribed.  Plan:  Pharmacy case will be closed as patient declined [place].   [name], PharmD, [place] Clinical Pharmacist [phone]

## 2017-02-28 ENCOUNTER — Telehealth: Payer: Self-pay | Admitting: Family Medicine

## 2017-02-28 NOTE — Telephone Encounter (Signed)
Patient's husband called stating that his wife has a tube in her stomach and she wants to get it removed but her physician is recommending she keep it.  Patient's husband wants to drop off some letters for Dr Anitra Lauth to take a look at to see if he can request a second opinion.   Just FYI he is planning to drop off paper work or letters today or tomorrow.

## 2017-02-28 NOTE — Telephone Encounter (Signed)
Noted  

## 2017-03-01 ENCOUNTER — Ambulatory Visit (INDEPENDENT_AMBULATORY_CARE_PROVIDER_SITE_OTHER): Payer: Medicare Other | Admitting: Ophthalmology

## 2017-03-01 DIAGNOSIS — I10 Essential (primary) hypertension: Secondary | ICD-10-CM | POA: Diagnosis not present

## 2017-03-01 DIAGNOSIS — E11311 Type 2 diabetes mellitus with unspecified diabetic retinopathy with macular edema: Secondary | ICD-10-CM

## 2017-03-01 DIAGNOSIS — E113512 Type 2 diabetes mellitus with proliferative diabetic retinopathy with macular edema, left eye: Secondary | ICD-10-CM | POA: Diagnosis not present

## 2017-03-01 DIAGNOSIS — E113591 Type 2 diabetes mellitus with proliferative diabetic retinopathy without macular edema, right eye: Secondary | ICD-10-CM | POA: Diagnosis not present

## 2017-03-01 DIAGNOSIS — H35033 Hypertensive retinopathy, bilateral: Secondary | ICD-10-CM

## 2017-03-01 DIAGNOSIS — H43813 Vitreous degeneration, bilateral: Secondary | ICD-10-CM

## 2017-03-01 LAB — HM DIABETES EYE EXAM

## 2017-03-05 ENCOUNTER — Encounter: Payer: Self-pay | Admitting: Family Medicine

## 2017-03-06 ENCOUNTER — Encounter: Payer: Self-pay | Admitting: Family Medicine

## 2017-03-06 ENCOUNTER — Other Ambulatory Visit: Payer: Self-pay | Admitting: Family Medicine

## 2017-03-06 NOTE — Telephone Encounter (Signed)
Please review and advise. Thanks.  

## 2017-03-06 NOTE — Telephone Encounter (Signed)
I'll deny this RF. Pls tell pt that I want her to remain off this med for now b/c the benefit of taking it is outweighed by the risks of this med in her current health condition.--thx

## 2017-03-06 NOTE — Telephone Encounter (Signed)
Pts husband advised and voiced understanding. He stated that he didn't know that she should have stopped this medication.

## 2017-03-07 ENCOUNTER — Ambulatory Visit (HOSPITAL_COMMUNITY): Payer: Medicare Other | Attending: Cardiology

## 2017-03-07 ENCOUNTER — Ambulatory Visit: Payer: Medicare Other

## 2017-03-07 ENCOUNTER — Other Ambulatory Visit: Payer: Self-pay

## 2017-03-07 VITALS — BP 116/61

## 2017-03-07 DIAGNOSIS — I313 Pericardial effusion (noninflammatory): Secondary | ICD-10-CM | POA: Diagnosis not present

## 2017-03-07 DIAGNOSIS — I700 Atherosclerosis of aorta: Secondary | ICD-10-CM | POA: Diagnosis not present

## 2017-03-07 DIAGNOSIS — I5181 Takotsubo syndrome: Secondary | ICD-10-CM

## 2017-03-07 DIAGNOSIS — I351 Nonrheumatic aortic (valve) insufficiency: Secondary | ICD-10-CM | POA: Insufficient documentation

## 2017-03-07 HISTORY — PX: TRANSTHORACIC ECHOCARDIOGRAM: SHX275

## 2017-03-07 MED ORDER — PERFLUTREN LIPID MICROSPHERE
1.0000 mL | INTRAVENOUS | Status: AC | PRN
  Administered 2017-03-07: 1 mL via INTRAVENOUS

## 2017-03-09 ENCOUNTER — Ambulatory Visit (INDEPENDENT_AMBULATORY_CARE_PROVIDER_SITE_OTHER): Payer: Self-pay | Admitting: Ophthalmology

## 2017-03-09 ENCOUNTER — Encounter: Payer: Self-pay | Admitting: Family Medicine

## 2017-03-13 ENCOUNTER — Ambulatory Visit (INDEPENDENT_AMBULATORY_CARE_PROVIDER_SITE_OTHER): Payer: Medicare Other | Admitting: Cardiology

## 2017-03-13 ENCOUNTER — Encounter: Payer: Self-pay | Admitting: Cardiology

## 2017-03-13 ENCOUNTER — Other Ambulatory Visit: Payer: Self-pay | Admitting: Family Medicine

## 2017-03-13 DIAGNOSIS — E784 Other hyperlipidemia: Secondary | ICD-10-CM | POA: Diagnosis not present

## 2017-03-13 DIAGNOSIS — I10 Essential (primary) hypertension: Secondary | ICD-10-CM | POA: Diagnosis not present

## 2017-03-13 DIAGNOSIS — I5181 Takotsubo syndrome: Secondary | ICD-10-CM

## 2017-03-13 DIAGNOSIS — I5042 Chronic combined systolic (congestive) and diastolic (congestive) heart failure: Secondary | ICD-10-CM

## 2017-03-13 DIAGNOSIS — I469 Cardiac arrest, cause unspecified: Secondary | ICD-10-CM

## 2017-03-13 DIAGNOSIS — I4901 Ventricular fibrillation: Secondary | ICD-10-CM | POA: Diagnosis not present

## 2017-03-13 DIAGNOSIS — E7849 Other hyperlipidemia: Secondary | ICD-10-CM

## 2017-03-13 MED ORDER — METOPROLOL TARTRATE 25 MG PO TABS: ORAL_TABLET | ORAL | 3 refills | Status: DC

## 2017-03-13 NOTE — Patient Instructions (Addendum)
MEDICATION CHANGE  --INCREASE TO 25 MG METOPROLOL TARTRATE IN THE MORNING AND 50 MG AT BEDTIME DAILY.   NO OTHER CHANGES   Your physician wants you to follow-up in [place].You will receive a reminder letter in the mail two months in advance. If you don't receive a letter, please call our office to schedule the follow-up appointment.    If you need a refill on your cardiac medications before your next appointment, please call your pharmacy.

## 2017-03-13 NOTE — Progress Notes (Signed)
PCP: [name], MD  Clinic Note: Chief Complaint  Patient presents with  . Follow-up    cardiomyopathy   HPI: [name] is a 71 y.o. female with a PMH below who presents today for 2 month follow-up for her Takotsubo Cardiomyopathy in the setting of sepsis/septic shock from pelvic abscess. - She was actually admitted in November 2017 for diverticulitis and pneumoperitoneum with sepsis. In the setting of significant pain and sepsis she had an episode of Torsade de Pointes VT and underwent CPR with defibrillation 2. --> Initial echo revealed EF 30-35% with anterior hypokinesis --> urgent catheterization revealed nonocclusive CAD with wall motion suggestive of Takotsubo Cardiomyopathy --> not discharged on LifeVest. Was placed on optimal medical management once her blood pressure stabilized.  [name] was last seen on 01/12/2017 after follow-up echo revealed improved EF to 40 and 45%. I saw her for preoperative evaluation - for plans to remove her peritoneal drain. Which she noted that time was chest wall discomfort from her CPR. Otherwise was doing relatively well.  Recent Hospitalizations: - None. She never underwent the procedure to remove the peritoneal drain.  Studies Personally Reviewed - (if available, images/films reviewed: From Epic Chart or Care Everywhere)  2D Echo 03/07/2017: EF 50-55%. Hypokinesis of the distal septum with overall low normal LV,  systolic function; mild diastolic dysfunction with elevated LV  filling pressure; mildly calcified aortic valve with mild AI; small pericardial effusion  Interval History: [name] presents today just frustrated about the peritoneal drain issue. Besides that, she just has a chronic mildly productive cough with some mild phlegm and some wheezing. She thinks she has a cold or has some allergy symptoms. No real dyspnea associated. She still has the chest wall pain from CPR but it is improving. She still has baseline  exertional dyspnea which is no real change from which it had before. No PND, orthopnea or edema. No anginal chest discomfort with rest or exertion.  No palpitations, lightheadedness, dizziness, weakness or syncope/near syncope. No TIA/amaurosis fugax symptoms. No claudication.  ROS: A comprehensive was performed. Review of Systems  Constitutional: Negative for malaise/fatigue (Overall activity level seems improving) and weight loss.  HENT: Positive for congestion. Negative for nosebleeds.   Respiratory: Positive for cough, sputum production (Just phlegm. Nothing suggestive of infectious etiology) and wheezing.   Cardiovascular:       Per history of present illness  Gastrointestinal: Negative for blood in stool and melena.  Genitourinary: Negative for hematuria.  Musculoskeletal: Positive for joint pain. Negative for falls and myalgias.  Skin: Negative.   Neurological:       Mildly unsteady gait. Walks with a cane  Endo/Heme/Allergies: Positive for environmental allergies.  Psychiatric/Behavioral: Positive for memory loss. The patient is not nervous/anxious.   All other systems reviewed and are negative.   I have reviewed and (if needed) personally updated the patient's problem list, medications, allergies, past medical and surgical history, social and family history.   Past Medical History:  Diagnosis Date  . Age-related nuclear cataract of both eyes 2016   +cortical age related cataracts OU   . Allergy    seasonal  . Arthritis    hands  . Bilateral diabetic retinopathy (HCC) 2015   Dr. Zigmund Daniel  . Blood transfusion without reported diagnosis    when taking chemo  . Chronic combined systolic and diastolic congestive heart failure (HCC)    Takotsubo CM:  a. 08/2016 Echo: EF 30-35%, gr1DD, PASP 35mHg;  b. 11/2016  Echo: EF 40-45%, no rwma, nl RV fxn, mild TR, PASP 62mHg.  . CKD (chronic kidney disease), stage II    GFR 60s  . 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  .Marland KitchenCOPD (chronic obstructive pulmonary disease) ([place]   . CVA [place]cerebral vascular accident) [place] 09[place]   MRI did show a left-sided small ischemic stroke which was acute but likely incidental (MRI was done b/c pt had TIA sx's in hospital).  Neuro put her on plavix at that time.  . Diabetes mellitus with complication (HCC)    diab retinopathy OU (laser)  . Diverticulitis 05/2016  . GERD (gastroesophageal reflux disease)    protonix  . History of diverticulitis of colon    with abscess; required IR percutaneous drain placed 09/2016.  .Marland KitchenHyperlipidemia   . Hypertension   . Left bundle branch block (LBBB) 12/16/2016  . Lung cancer [place]    non-small cell lung ca, stage III in 05/2011; systemic chemotherapy concurrent with radiation followed by prophylactic cranial irradiation and has been observation since July of 2010 with no evidence for disease recurrence-released from onc f/u 12/2014 (needs annual cxr by PCP).  CXR 08/2015 stable.  . Mild cognitive impairment with memory loss    Likely from brain radiation therapy  . Non-obstructive CAD (coronary artery disease)    a. Cath 2006 preserved LV fxn, scattered irregularities without critical stenosis; b. 2008 stress echo negative for ischemia, but with hypertensive response; c. 08/2016 Cath: D1 25%, otw nl.  . Normocytic anemia 2018   Iron, vit B12, folate all normal 12/2016.  .Marland KitchenOpen-angle glaucoma 2016   Dr. BShirley Muscat (bilateral)---responding to topical therapy  . Osteopenia 03/2016   03/2016 DEXA T-score -2.2  . Pneumonia    hospitalization 11/2016; hypoxemic resp failure--d/c'd home with home oxygen therapy  . Subclinical hyperthyroidism 2018  . Takotsubo cardiomyopathy    a. 08/2016 Echo: EF 30-35%, gr1DD, PASP 322mg;  b. 08/2016 Cath: nonobs Dzs;  c. 11/2016 Echo: EF 40-45%, no rwma, nl RV fxn, mild TR, PASP 3741m.  ECHO 02/2017 EF 50-55%, grd II DD.  . [place] pointes [place]   a. 08/2016 in  setting of diverticulitis & pneumoperitoneum and Takotsubo CM -->prolonged QT, seen by EP-->avoid meds with potential for QT prolongation.    Past Surgical History:  Procedure Laterality Date  . ABDOMINAL HYSTERECTOMY  1997  . APPENDECTOMY    . CARDIAC CATHETERIZATION N/A 09/14/2016   Procedure: Left Heart Cath and Coronary Angiography;  Surgeon: ThoTroy SineD;  Location: [facility];  Service: Cardiovascular; Mild nonobstructive CAD with 85% smooth narrowing in the first diagonal branch of the LAD; 10% smooth narrowing of the ostial proximal left circumflex coronary artery; and a normal dominant RCA.  . CMarland KitchenRPAL TUNNEL RELEASE    . CATARACT EXTRACTION    . CHOLECYSTECTOMY  2002  . COLONOSCOPY  10/24/00   normal.  [employer] hemoccult testing via [facility]25/16 was NEG  . IR GENERIC HISTORICAL  09/19/2016   IR SINUS/FIST TUBE CHK-NON GI 09/19/2016 JaiCorrie MckusickO [facility]  . IR GENERIC HISTORICAL  10/06/2016   IR RADIOLOGIST EVAL & MGMT 10/06/2016 [facility]  . IR GENERIC HISTORICAL  10/26/2016   IR RADIOLOGIST EVAL & MGMT 10/26/2016 [facility]  . IR GENERIC HISTORICAL  11/03/2016   IR RADIOLOGIST EVAL & MGMT 11/03/2016 WenArdis RowanA-C [facility]  . IR GENERIC HISTORICAL  11/24/2016  IR RADIOLOGIST EVAL & MGMT 11/24/2016 Ardis Rowan, PA-C [facility]  . IR GENERIC HISTORICAL  12/05/2016   Fistula smaller/improving.  IR SINUS/FIST TUBE CHK-NON GI 12/05/2016 Sandi Mariscal, MD [facility]  . IR GENERIC HISTORICAL  12/22/2016   IR RADIOLOGIST EVAL & MGMT 12/22/2016 Greggory Keen, MD [facility]  . LEFT HEART CATHETERIZATION WITH CORONARY ANGIOGRAM N/A 05/30/2012   Procedure: LEFT HEART CATHETERIZATION WITH CORONARY ANGIOGRAM;  Surgeon: Burnell Blanks, MD;  Location: [facility] C[facility] LAB;  Service: Cardiovascular: Mild, non-obstructive CAD  . TRANSTHORACIC ECHOCARDIOGRAM  09/14/2016; 11/2016; 02/2017   08/2016: EF 30-35 %, Akinesis of the  mid-apicalanteroseptal myocardium.  Grade I DD.  Mild pulm HTN.  Repeat echo 11/2016, EF 40-45%, no rwma, nl RV fxn, mild TR, PASP 52mHg.  02/2017 EF 50-55%, grade II DD    Current Meds  Medication Sig  . acetaminophen (TYLENOL) 500 MG tablet Take 500-1,000 mg by mouth every 6 (six) hours as needed for headache (or pain).   .Marland Kitchenalbuterol (PROAIR HFA) 108 (90 Base) MCG/ACT inhaler Inhale 2 puffs into the lungs every 6 (six) hours as needed for wheezing or shortness of breath.  .Marland Kitchenalbuterol (PROVENTIL) (2.5 MG/3ML) 0.083% nebulizer solution USE ONE VIAL IN NEBULIZER EVERY 4 HOURS AS NEEDED FOR WHEEZING OR SHORTNESS OF BREATH  . aspirin 81 MG chewable tablet Chew 81 mg by mouth daily.  .Marland Kitchenatorvastatin (LIPITOR) 80 MG tablet TAKE ONE TABLET BY MOUTH ONCE DAILY (Patient taking differently: TAKE ONE TABLET BY MOUTH ONCE DAILY AT BEDTIME)  . cetirizine (ZYRTEC) 10 MG tablet Take 10 mg by mouth daily.  . clopidogrel (PLAVIX) 75 MG tablet Take 1 tablet (75 mg total) by mouth daily.  . fluticasone (FLOVENT HFA) 220 MCG/ACT inhaler Inhale 2 puffs into the lungs 2 (two) times daily.  . furosemide (LASIX) 40 MG tablet Take 0.5 tablets (20 mg total) by mouth daily.  .Marland Kitchenglucose blood test strip Use to check blood sugar three times daily  . HUMULIN 70/30 KWIKPEN (70-30) 100 UNIT/ML PEN Per sliding scale, takes this qAM and aPM:  140-199 - 2 units, 200-250 - 4 units, 251-299 - 6 units,  300-349 - 8 units,  350 or above 10 units. Dispense syringes and needles as needed, Ok to switch to PEN if approved. Substitute to any brand approved. DX DM2, Code E11.65  . loratadine (CLARITIN) 10 MG tablet Take 10 mg by mouth at bedtime.   . metFORMIN (GLUCOPHAGE) 500 MG tablet Take 500 mg by mouth 2 (two) times daily with a meal. STOP TAKING UNTIL FURTHER NOTICE--PT TOO HIGH RISK OF ACIDOSIS AS OF 03/06/17.--PM  . nitroGLYCERIN (NITROSTAT) 0.4 MG SL tablet Place 1 tablet (0.4 mg total) under the tongue every 5 (five) minutes as needed.  For chest pain  . ONE TOUCH ULTRA TEST test strip USE  STRIP TO CHECK GLUCOSE THREE TIMES DAILY  . pantoprazole (PROTONIX) 40 MG tablet Take 1 tablet (40 mg total) by mouth daily.  . potassium chloride (K-DUR) 10 MEQ tablet Take 1 tablet (10 mEq total) by mouth daily.  . [DISCONTINUED] metoprolol tartrate (LOPRESSOR) 25 MG tablet Take 1/2 tablet (12.5 mg) twice daily for two weeks. Then Take 1 tablet (25 mg) twice daily.    Allergies  Allergen Reactions  . Lactose Intolerance (Gi) Diarrhea  . Ibuprofen Other (See Comments)    REACTION: Anxiousness, hyperventilates    Social History   Social History  . Marital status: Married    Spouse  name: N/A  . Number of children: N/A  . Years of education: N/A   Occupational History  . Retired Systems analyst Unemployed   Social History Main Topics  . Smoking status: Former Smoker    Packs/day: 1.00    Years: 45.00    Types: Cigarettes    Quit date: 10/24/2008  . Smokeless tobacco: Never Used  . Alcohol use No  . Drug use: No  . Sexual activity: Not Asked   Other Topics Concern  . None   Social History Narrative   Lives in [place], has husband and daughter and son. Daughter's family is living with her and husband at present. She is ambulatory daily without cane or walker.     family history includes Diabetes in her brother, father, and mother; Heart attack in her brother, brother, and brother; Heart disease in her brother; Heart failure in her mother; Kidney disease in her mother.  Wt Readings from Last 3 Encounters:  03/13/17 141 lb 9.6 oz (64.2 kg)  02/16/17 138 lb 12.8 oz (63 kg)  02/06/17 139 lb 9.6 oz (63.3 kg)    PHYSICAL EXAM BP 126/70   Pulse (!) 101   Ht '5\' 2"'$  (1.575 m)   Wt 141 lb 9.6 oz (64.2 kg)   SpO2 97%   BMI 25.90 kg/m  General appearance: alert, cooperative, appears stated age, no distress. Well-nourished. Well-groomed. She does just seen elderly and mildly frail. HEENT: [place]/AT, EOMI, MMM, anicteric  sclera; arcus senilis Neck: no adenopathy, no carotid bruit and no JVD Lungs: Diffuse coarse rhonchorous breath sounds with mild wheezing, does clear somewhat with cough. But no rales. Nonlabored and otherwise good air movement Heart: RRR. Normal S1 and S2. No M/R/G. Nondisplaced PMI. She still has chest wall tenderness and pain along both sides the rib cage Abdomen: soft, non-tender (with the exception of around the peritoneal drain); bowel sounds normal; no masses,  no organomegaly; peritoneal drain still in place.  Extremities: extremities normal, atraumatic, no cyanosis, and edema - none Pulses: 2+ and symmetric; mildly diminished pedal pulses otherwise palpable Skin: mobility and turgor normal, no edema and no evidence of bleeding or bruising Neurologic: Mental status: Alert & oriented x 3, thought content appropriate; non-focal exam.  Pleasant mood & affect. Mildly unsteady gait. Walks with cane    Adult ECG Report N/a   Other studies Reviewed: Additional studies/ records that were reviewed today include:  Recent Labs:   Lab Results  Component Value Date   CREATININE 0.79 12/27/2016   BUN 10 12/27/2016   NA 143 12/27/2016   K 3.6 12/27/2016   CL 104 12/27/2016   CO2 33 (H) 12/27/2016   Lab Results  Component Value Date   CHOL 65 12/02/2016   HDL 34 (L) 12/02/2016   LDLCALC 23 12/02/2016   LDLDIRECT 162.0 07/17/2015   TRIG 42 12/02/2016   CHOLHDL 1.9 12/02/2016   ASSESSMENT / PLAN: Problem List Items Addressed This Visit    Cardiac arrest with ventricular fibrillation ([place])    No further episodes since hospitalization. This was in the setting of sepsis and Takotsubo cardiomyopathy with prolonged QT interval. I suspect that this is stable, and would not expect episode.      Relevant Medications   metoprolol tartrate (LOPRESSOR) 25 MG tablet   Chronic combined systolic and diastolic congestive heart failure (HCC)    Systolic heart failure essentially resolved now. She  probably still has some diastolic dysfunction, but no signs of heart failure. Euvolemic. On stable  dose of metoprolol. Low-dose Lasix with when necessary additional dose.      Relevant Medications   metoprolol tartrate (LOPRESSOR) 25 MG tablet   Essential hypertension (Chronic)    Well-controlled on current medications. I think were okay increasing her metoprolol dose to 50 mg in the evening along with '25mg'$  in the morning.      Relevant Medications   metoprolol tartrate (LOPRESSOR) 25 MG tablet   Hyperlipidemia (Chronic)    She continues to be on high-dose atorvastatin which I think we can probably reduce to 40 mg daily based on last labs.      Relevant Medications   metoprolol tartrate (LOPRESSOR) 25 MG tablet   Takotsubo cardiomyopathy (Chronic)    Notable improvement in EF with resolution of Takotsubo cardiomyopathy and medication optimization. Essentially resolved on cardiomyopathy now. Would continue medical management with low-dose furosemide to avoid edema.   Plan will be to increase metoprolol dose to 50 mg daily at bedtime and 25 mg the morning based on her tachycardia      Relevant Medications   metoprolol tartrate (LOPRESSOR) 25 MG tablet      Current medicines are reviewed at length with the patient today. (+/- concerns) n/a  The following changes have been made: n/a   Patient Instructions  MEDICATION CHANGE  --INCREASE TO 25 MG METOPROLOL TARTRATE IN THE MORNING AND 50 MG AT BEDTIME DAILY.    NO OTHER CHANGES      Your physician wants you to follow-up in [place].You will receive a reminder letter in the mail two months in advance. If you don't receive a letter, please call our office to schedule the follow-up appointment.    If you need a refill on your cardiac medications before your next appointment, please call your pharmacy.    Studies Ordered:   No orders of the defined types were placed in this encounter.    Glenetta Hew, M.D., M.S. Interventional Cardiologist   Pager # [phone] Phone # [phone] [address] [place], [place] [address]

## 2017-03-14 ENCOUNTER — Other Ambulatory Visit: Payer: Self-pay

## 2017-03-14 NOTE — Patient Outreach (Signed)
[facility]) Care Management  03/14/2017  MICHAELAH CREDEUR 1945/12/28 [id number]   Subjective: "I'm doing alright, I am doing good".  Objective: none  Assessment: 71 year old with recent admission to hospital due to sepsis, upper respiratory failure. History of COPD, HTN, DM, hyperlipidemia, Torsade-de pointes, stroke, diverticulitis.  RNCM called to follow up. Ms. Thornsberry reports she has been to cardiologist and reports that everything is doing well. Ms. Royle acknowledged a change in her medication and states her daughter, who manages her medication, is aware and has made the changes for her. She states she was informed that she will have the drain for the rest of her life.  Client denies any issues or concerns. RNCM discussed case closure. Ms. Speights is in agreement.   RNCM encouraged client to call as needed. Also encouraged to call 24 hour nurse advice line as needed.   Plan: close case.  Thea Silversmith, RN, MSN, [place] Coordinator Cell: [phone]

## 2017-03-15 ENCOUNTER — Encounter: Payer: Self-pay | Admitting: Cardiology

## 2017-03-15 NOTE — Assessment & Plan Note (Addendum)
Notable improvement in EF with resolution of Takotsubo cardiomyopathy and medication optimization. Essentially resolved on cardiomyopathy now. Would continue medical management with low-dose furosemide to avoid edema.   Plan will be to increase metoprolol dose to 50 mg daily at bedtime and 25 mg the morning based on her tachycardia

## 2017-03-15 NOTE — Assessment & Plan Note (Addendum)
Systolic heart failure essentially resolved now. She probably still has some diastolic dysfunction, but no signs of heart failure. Euvolemic. On stable dose of metoprolol. Low-dose Lasix with when necessary additional dose.

## 2017-03-15 NOTE — Assessment & Plan Note (Signed)
She continues to be on high-dose atorvastatin which I think we can probably reduce to 40 mg daily based on last labs.

## 2017-03-15 NOTE — Assessment & Plan Note (Signed)
No further episodes since hospitalization. This was in the setting of sepsis and Takotsubo cardiomyopathy with prolonged QT interval. I suspect that this is stable, and would not expect episode.

## 2017-03-15 NOTE — Assessment & Plan Note (Addendum)
Well-controlled on current medications. I think were okay increasing her metoprolol dose to 50 mg in the evening along with 25mg  in the morning.

## 2017-03-16 ENCOUNTER — Ambulatory Visit
Admission: RE | Admit: 2017-03-16 | Discharge: 2017-03-16 | Disposition: A | Discharge status: Home / Self Care | Payer: Medicare Other | Source: Ambulatory Visit | Attending: Family Medicine | Admitting: Family Medicine

## 2017-03-16 DIAGNOSIS — Z1231 Encounter for screening mammogram for malignant neoplasm of breast: Secondary | ICD-10-CM | POA: Diagnosis not present

## 2017-03-24 ENCOUNTER — Encounter: Payer: Self-pay | Admitting: Family Medicine

## 2017-03-28 ENCOUNTER — Ambulatory Visit (HOSPITAL_COMMUNITY)
Admission: RE | Admit: 2017-03-28 | Discharge: 2017-03-28 | Disposition: A | Discharge status: Home / Self Care | Payer: Medicare Other | Source: Ambulatory Visit | Attending: General Surgery | Admitting: General Surgery

## 2017-03-28 ENCOUNTER — Other Ambulatory Visit (HOSPITAL_COMMUNITY): Payer: Self-pay | Admitting: General Surgery

## 2017-03-28 ENCOUNTER — Encounter (HOSPITAL_COMMUNITY): Payer: Self-pay | Admitting: Interventional Radiology

## 2017-03-28 DIAGNOSIS — K632 Fistula of intestine: Secondary | ICD-10-CM

## 2017-03-28 DIAGNOSIS — T85898A Other specified complication of other internal prosthetic devices, implants and grafts, initial encounter: Secondary | ICD-10-CM | POA: Diagnosis not present

## 2017-03-28 DIAGNOSIS — Z4803 Encounter for change or removal of drains: Secondary | ICD-10-CM | POA: Diagnosis not present

## 2017-03-28 HISTORY — PX: IR SINUS/FIST TUBE CHK-NON GI: IMG673

## 2017-03-28 MED ORDER — LIDOCAINE HCL 1 % IJ SOLN
INTRAMUSCULAR | Status: DC | PRN
  Administered 2017-03-28: 10 mL

## 2017-03-28 MED ORDER — IOPAMIDOL (ISOVUE-300) INJECTION 61%
INTRAVENOUS | Status: AC
  Administered 2017-03-28: 15 mL
  Filled 2017-03-28: qty 50

## 2017-03-28 MED ORDER — LIDOCAINE HCL 1 % IJ SOLN
INTRAMUSCULAR | Status: AC
  Filled 2017-03-28: qty 20

## 2017-04-07 ENCOUNTER — Encounter: Payer: Self-pay | Admitting: Interventional Radiology

## 2017-04-14 ENCOUNTER — Ambulatory Visit: Payer: Medicare Other | Admitting: Cardiology

## 2017-04-17 IMAGING — CT CT ABD-PELV W/ CM
3 of 5 series · 15 of 36 positions shown, 18 images · IV contrast (Omni 300)
Comparison: Chest CT 09/14/2015.  Abdominal CT 12/30/2015.

CLINICAL DATA: Fall out of bed this morning. Right lower quadrant
pain. History of right lung cancer, post radiation and chemotherapy.

EXAM:
CT CHEST, ABDOMEN, AND PELVIS WITH CONTRAST
TECHNIQUE: Multidetector CT imaging of the chest, abdomen and pelvis was
performed following the standard protocol during bolus
administration of intravenous contrast.
CONTRAST:  1 RRYPD4-N00 IOPAMIDOL (RRYPD4-N00) INJECTION 61%

[Series 2: cap with 5mm st · axial · 0.88mm/px · z∈[-458,+37]mm · 10 of 123 slices shown, 13 images]
[im 12/123  mediastinal]
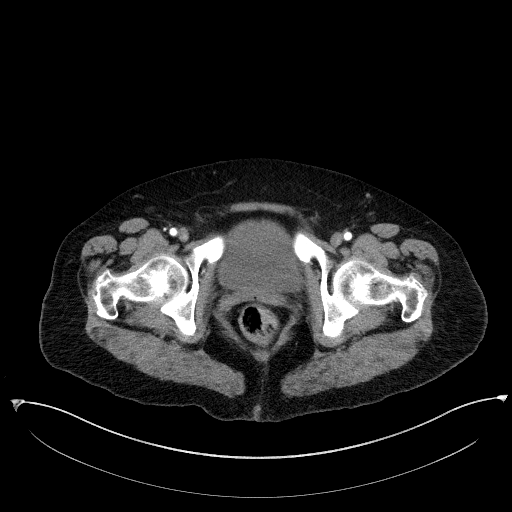
[im 12/123  lung]
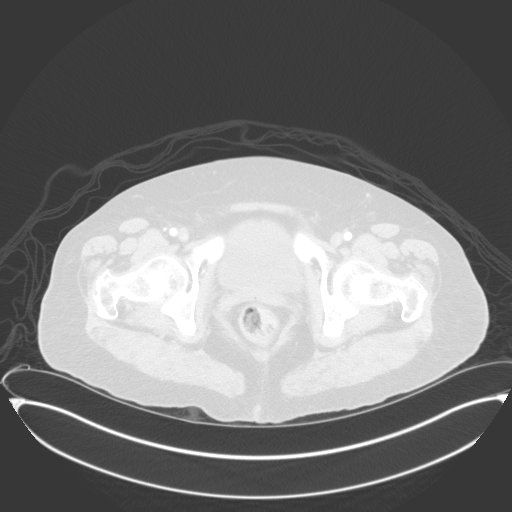
[im 23/123  lung]
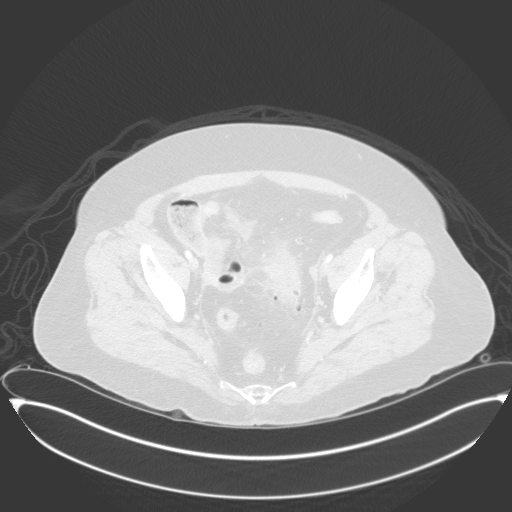
[im 34/123  lung]
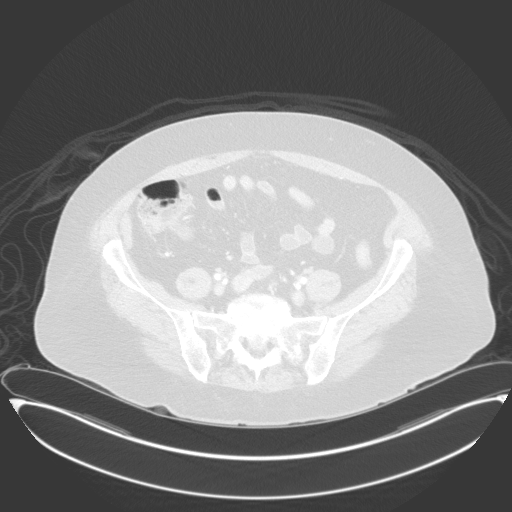
[im 45/123  lung]
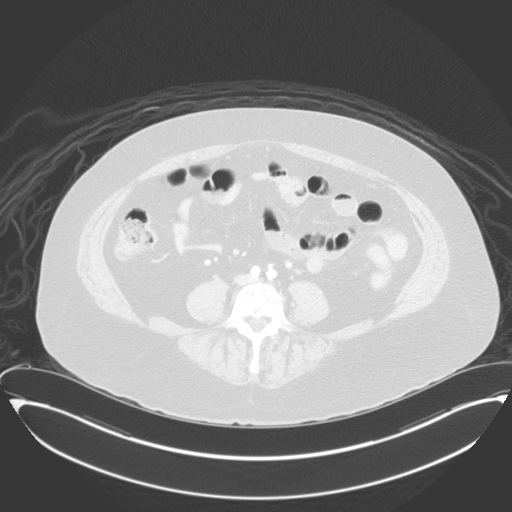
[im 56/123  mediastinal]
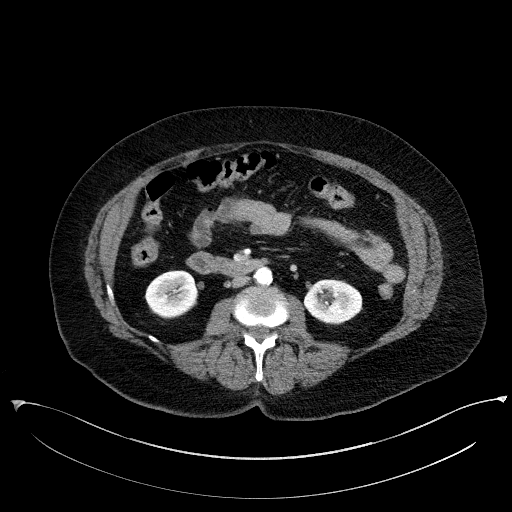
[im 56/123  lung]
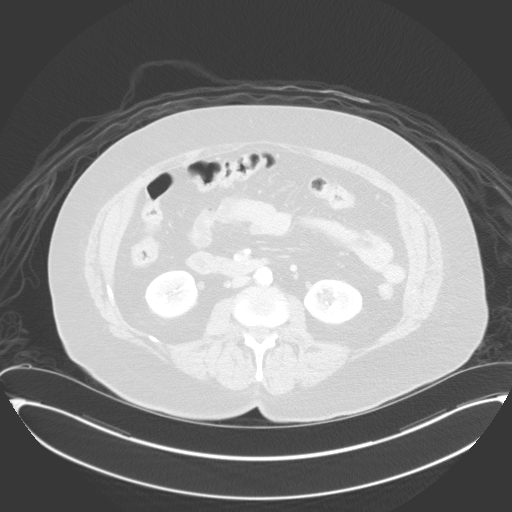
[im 67/123  lung]
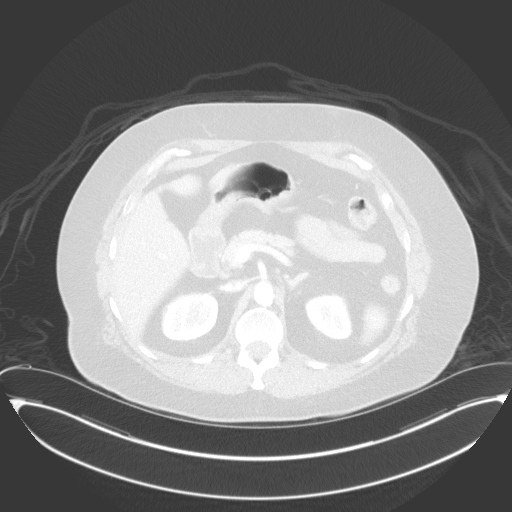
[im 78/123  lung]
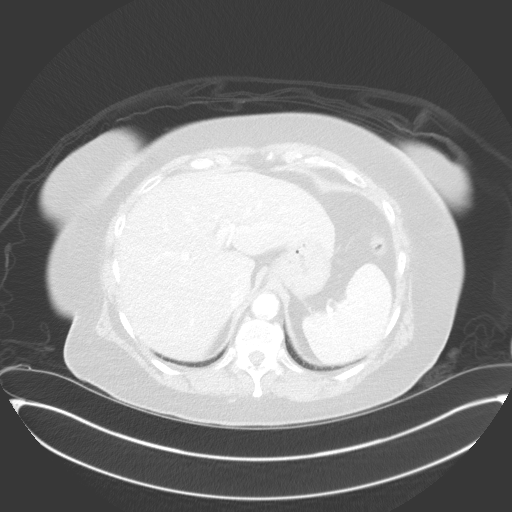
[im 89/123  lung]
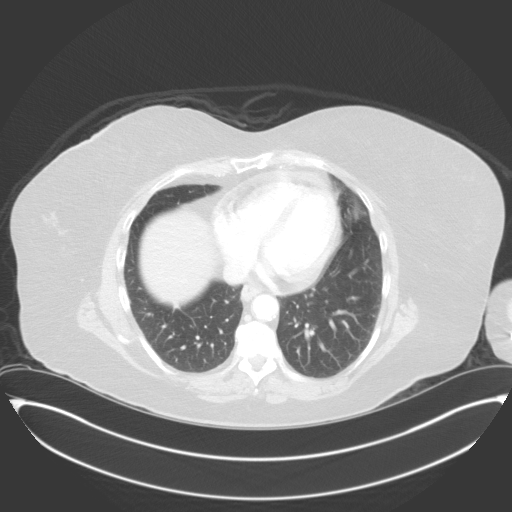
[im 100/123  mediastinal]
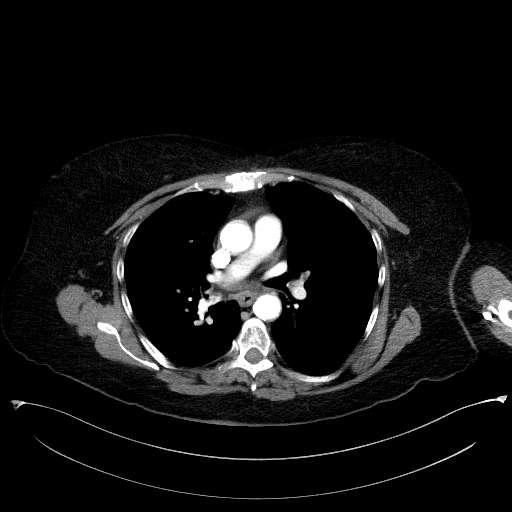
[im 100/123  lung]
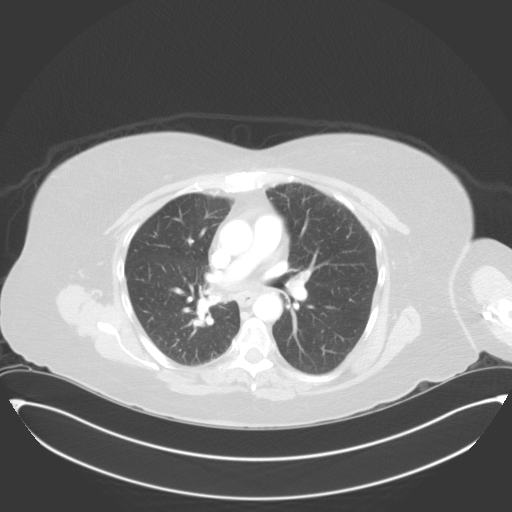
[im 111/123  lung]
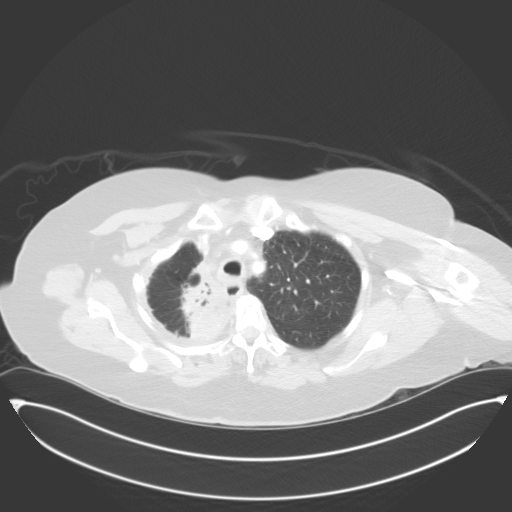

[Series 5: lung · axial · 0.87mm/px · z∈[-131,-89]mm · 2 of 125 slices shown]
[im 11/125  lung]
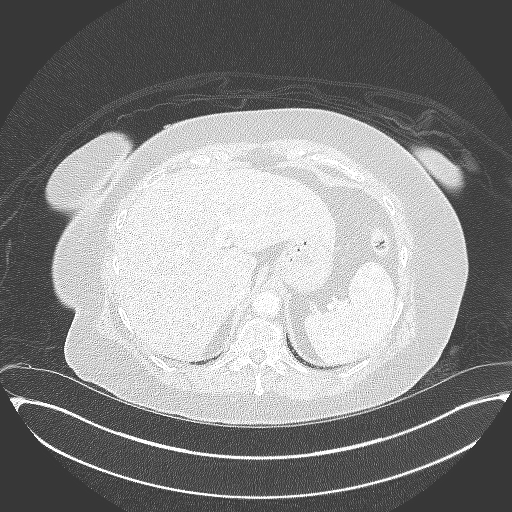
[im 32/125  lung]
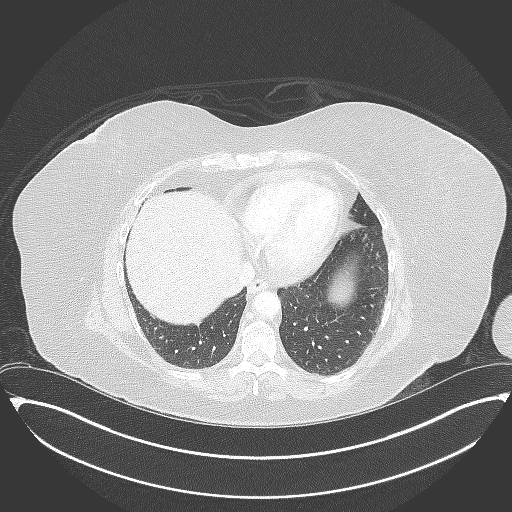

[Series 602: coronal · coronal · 1.20mm/px · 3 of 92 slices shown]
[im 19/92  lung]
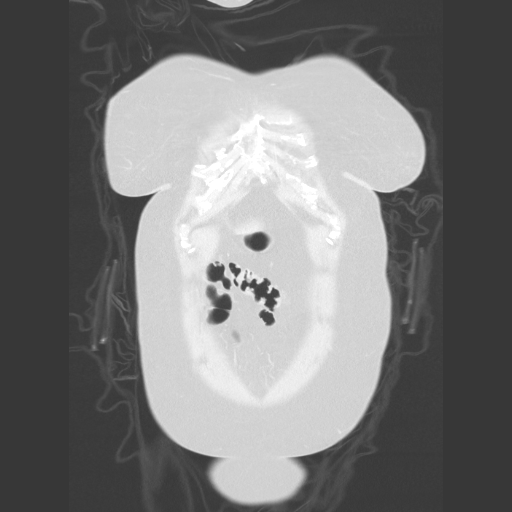
[im 37/92  lung]
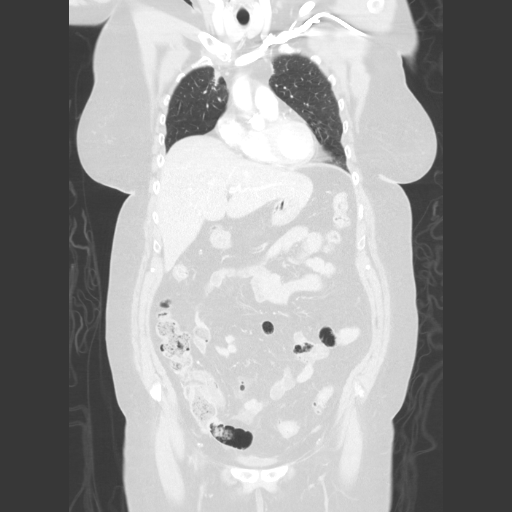
[im 55/92  lung]
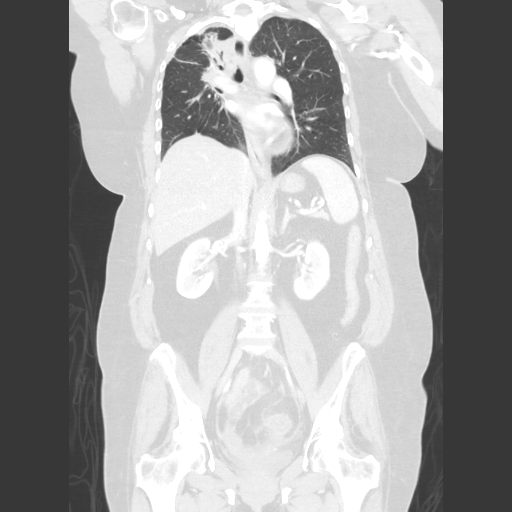

[15 of 36 positions shown; findings below may reference images not displayed]

FINDINGS: Cardiovascular: Aortic atherosclerosis. No evidence of aneurysm.
Heart is normal size. Small pericardial effusion.

Mediastinum/Nodes: No mediastinal, hilar, or axillary adenopathy.

Lungs/Pleura: Chronic opacities and volume loss noted in the right
upper lobe extending from the right hilum to the right apex, stable
since prior study compatible with postradiation fibrosis. No other
confluent airspace opacities. No pleural effusions. No suspicious
pulmonary nodule.

Upper Abdomen: Prior cholecystectomy. No acute findings in the upper
abdomen.

Musculoskeletal: Chest wall soft tissues are unremarkable. No acute
bony abnormality or focal bone lesion. There is for a gap insert or
rib

CT ABDOMEN PELVIS FINDINGS

Hepatobiliary: Prior cholecystectomy. No focal hepatic abnormality
or biliary ductal dilatation.

Pancreas: No focal abnormality or ductal dilatation.

Spleen: No focal abnormality.  Normal size.

Adrenals/Urinary Tract: No adrenal abnormality. No focal renal
abnormality. No stones or hydronephrosis. Urinary bladder is
unremarkable.

Stomach/Bowel: Sigmoid diverticulosis. Extensive inflammatory
changes around the sigmoid colon compatible with active
diverticulitis. No focal fluid collection/abscess. Stomach and small
bowel decompressed, grossly unremarkable.

Vascular/Lymphatic: Scattered aortic and iliac calcifications. No
aneurysm. No adenopathy.

Reproductive: Prior hysterectomy.  No adnexal masses.

Other: No free fluid or free air.

Musculoskeletal: No acute bony abnormality or focal bone lesion.
IMPRESSION: Stable chronic postradiation fibrosis in the right upper lobe.

No acute cardiopulmonary disease.

Sigmoid diverticulosis with changes of active diverticulitis.

Small pericardial effusion.

Aortic atherosclerosis.

## 2017-04-17 IMAGING — DX DG CHEST 1V PORT
1 series · 1 of 1 positions shown · non-contrast
Comparison: CT chest dated 06/14/2016. Chest radiographs dated
09/14/2015.

CLINICAL DATA: Code sepsis, history of lung cancer

EXAM:
PORTABLE CHEST 1 VIEW

[chest ap]
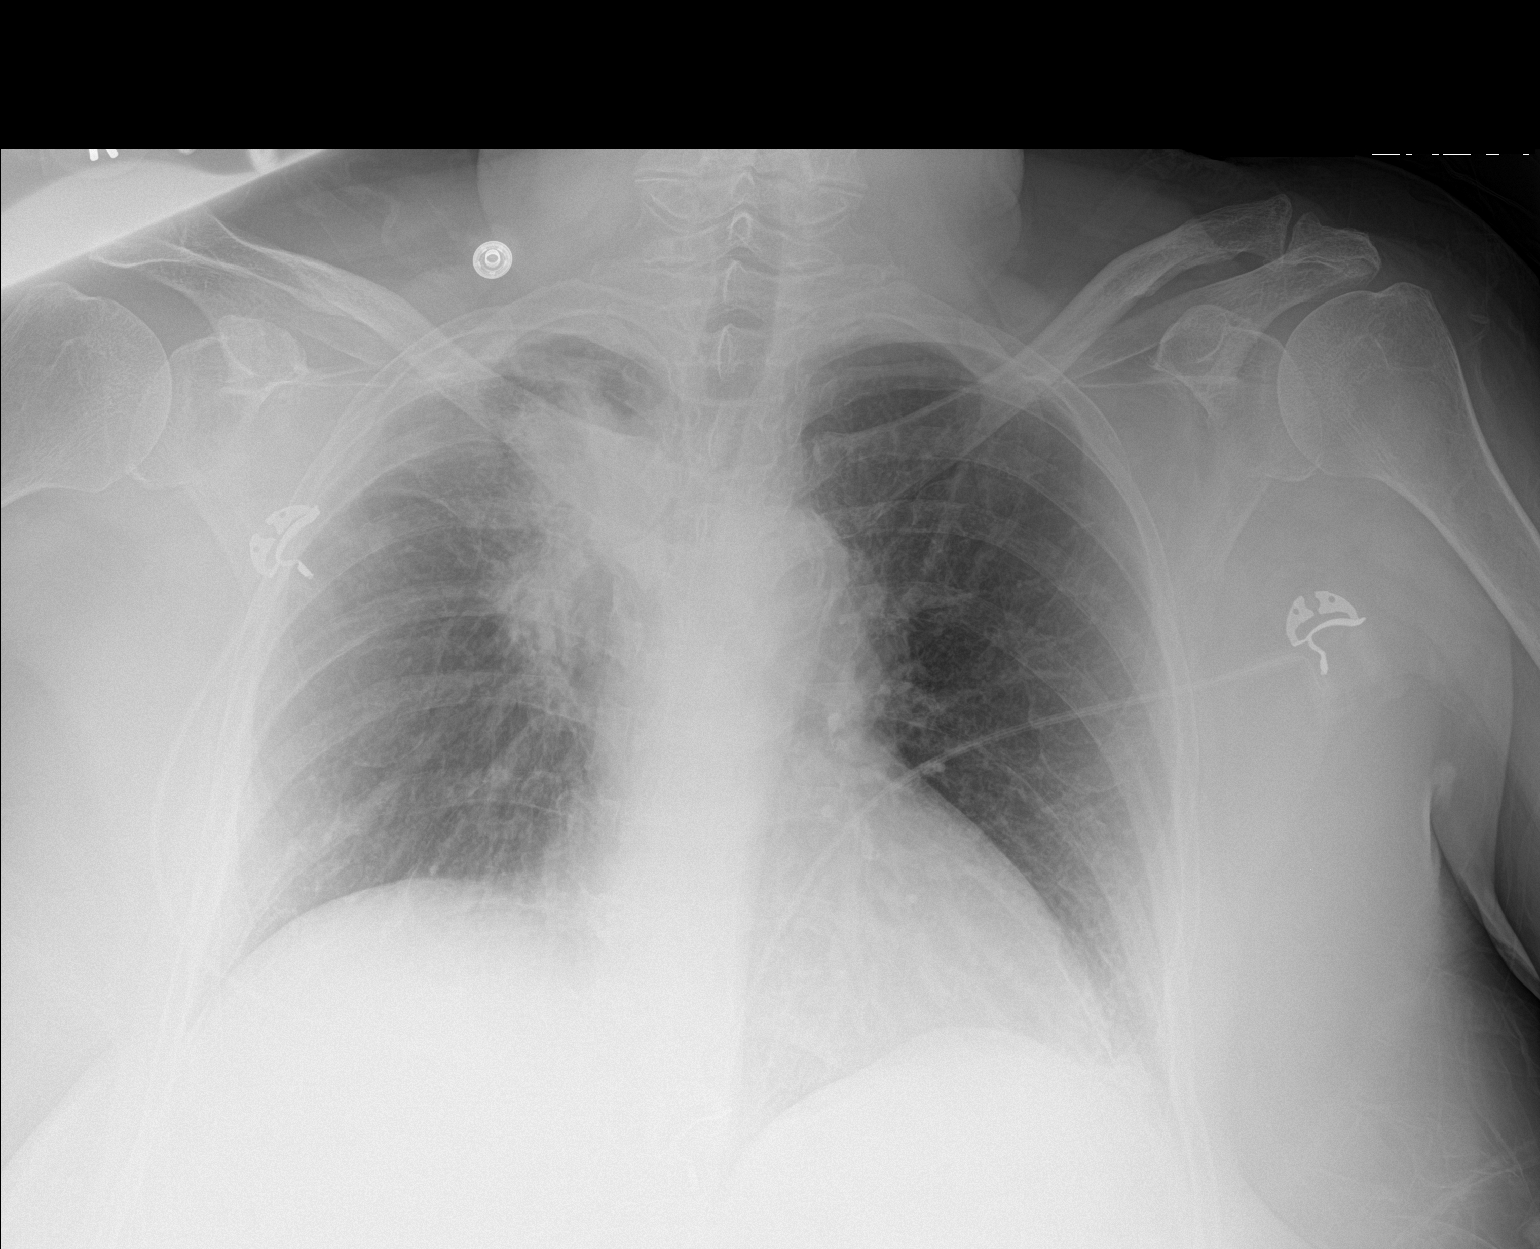

[1 of 1 positions shown; findings below may reference images not displayed]

FINDINGS: Mass-like opacity in the medial right upper lobe, unchanged from
1127, corresponding to radiation fibrosis when correlating with
prior CT chest.

Lungs are otherwise clear.  No pleural effusion or pneumothorax.

The heart is normal in size.
IMPRESSION: Radiation fibrosis in the medial right upper lobe, unchanged.

No evidence of acute cardiopulmonary disease.

## 2017-04-17 IMAGING — CT CT CERVICAL SPINE W/O CM
2 of 10 series · 5 of 33 positions shown, 6 images · non-contrast
Comparison: 03/02/2015 and PET scan 07/26/2010

CLINICAL DATA: Un- witnessed fall

EXAM:
CT HEAD WITHOUT CONTRAST
CT CERVICAL SPINE WITHOUT CONTRAST
TECHNIQUE: Multidetector CT imaging of the head and cervical spine was
performed following the standard protocol without intravenous
contrast. Multiplanar CT image reconstructions of the cervical spine
were also generated.

[Series 304: orthgonal · axial · 0.32mm/px · z∈[+1054,+1136]mm · 3 of 165 slices shown, 4 images]
[im 42/165  soft-tissue]
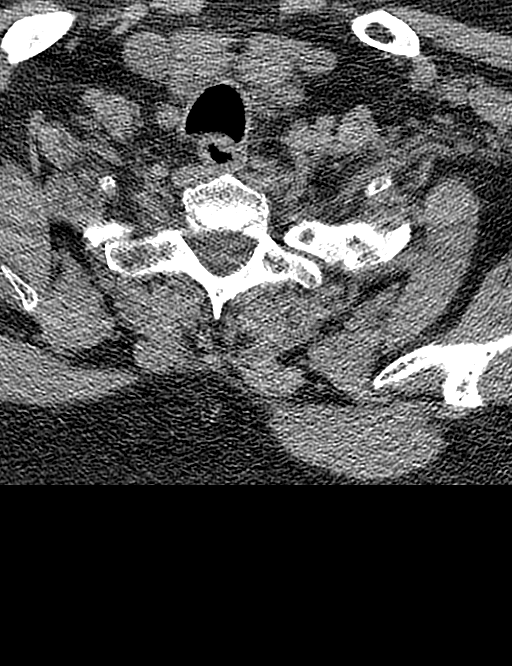
[im 42/165  bone]
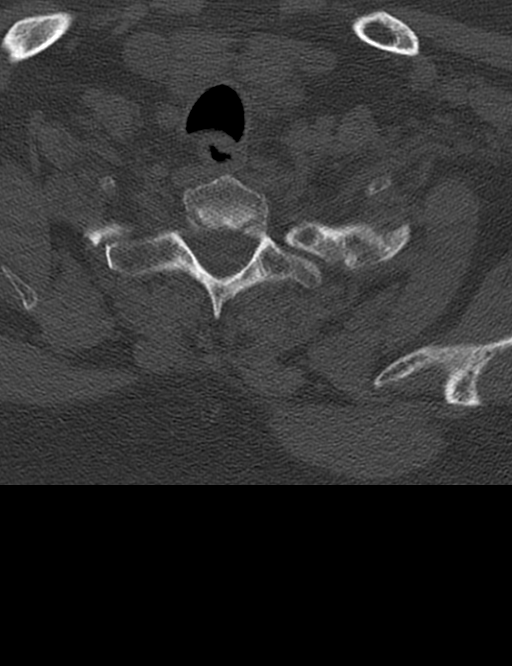
[im 83/165  bone]
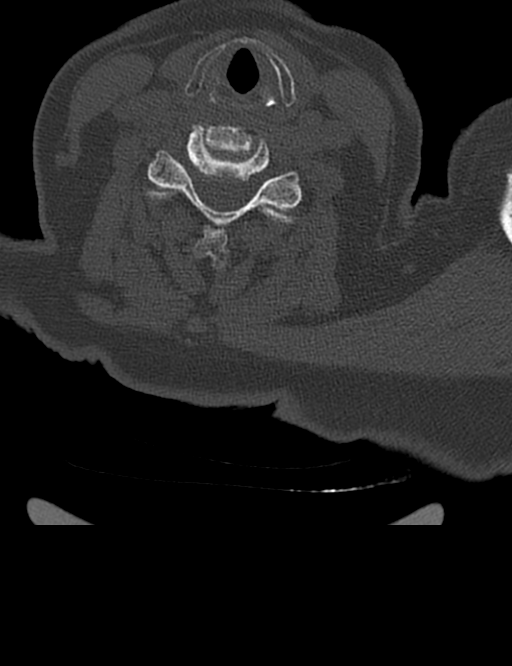
[im 124/165  bone]
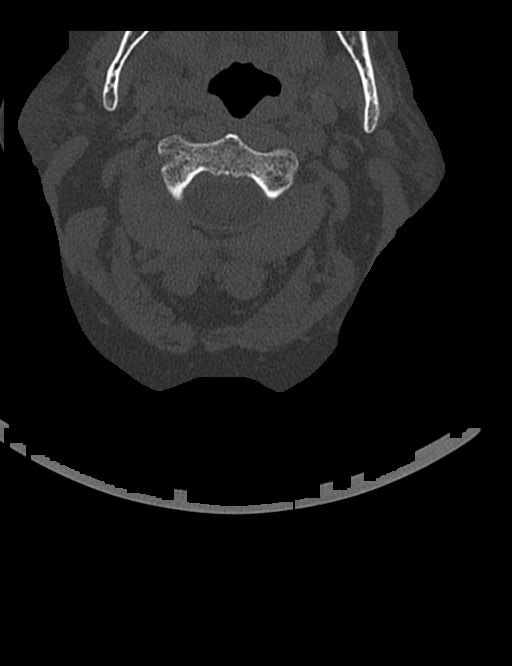

[Series 306: sagittal · sagittal · 0.33mm/px · 2 of 45 slices shown]
[im 15/45  bone]
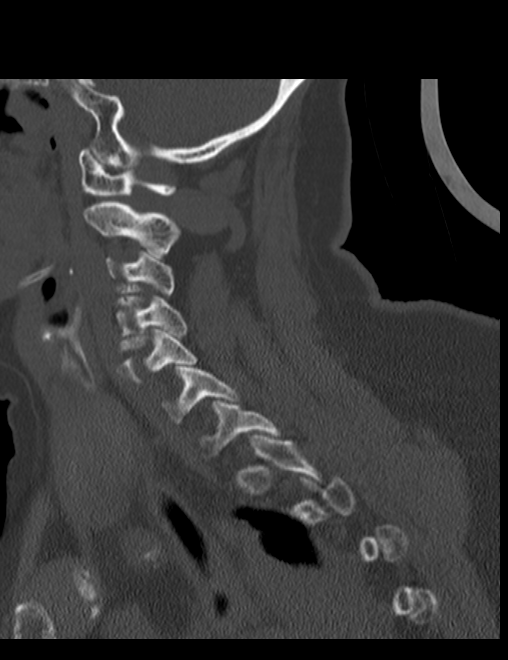
[im 30/45  bone]
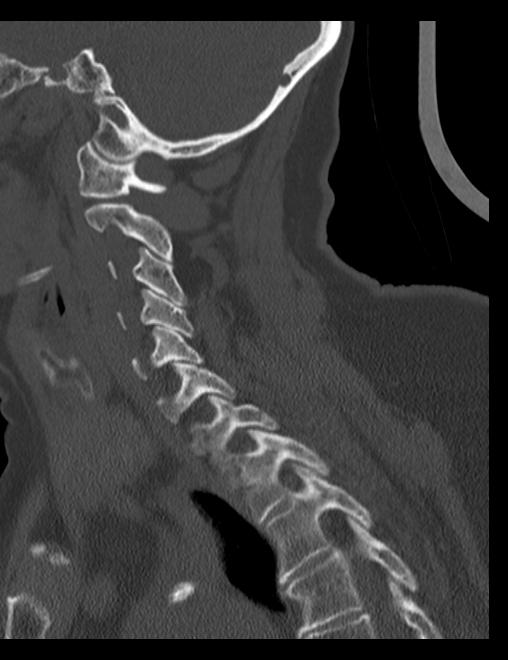

[5 of 33 positions shown; findings below may reference images not displayed]

FINDINGS: CT HEAD FINDINGS

No skull fracture is noted. Paranasal sinuses and mastoid air cells
are unremarkable. No intracranial hemorrhage, mass effect or midline
shift. No acute cortical infarction. No mass lesion is noted on this
unenhanced scan. Stable atrophy and chronic white matter disease.
Ventricular size is stable from prior exam.

CT CERVICAL SPINE FINDINGS

Axial images of the cervical spine shows no acute fracture or
subluxation. Mild degenerative changes are noted C1-C2 articulation.
Computer processed images shows no acute fracture or subluxation.
There is disc space flattening with mild anterior and mild posterior
spurring at C4-C5 level. Mild anterior spurring at C5-C6 level.
There is anterior spurring at T2-T3 level. No prevertebral soft
tissue swelling. Cervical airway is patent.

Emphysematous changes are noted in right upper lobe. There is
consolidation with bronchiectasis and air bronchogram in right upper
lobe medially. Although this may be due to chronic scarring and
postradiation changes pneumonia cannot be excluded. Clinical
correlation is necessary. Further evaluation with CT scan of the
chest could be performed as clinically warranted.
IMPRESSION: 1. No acute intracranial abnormality. Stable atrophy and chronic
white matter disease.
2. No cervical spine acute fracture or subluxation. Multilevel
degenerative changes as described above.
3. Emphysematous changes are noted in right upper lobe. There is
consolidation with bronchiectasis and air bronchogram in right upper
lobe medially. Although this may be due to chronic scarring and
postradiation changes pneumonia cannot be excluded. Clinical
correlation is necessary. Further evaluation with CT scan of the
chest could be performed as clinically warranted.

## 2017-04-20 ENCOUNTER — Encounter: Payer: Self-pay | Admitting: Family Medicine

## 2017-04-20 ENCOUNTER — Ambulatory Visit (INDEPENDENT_AMBULATORY_CARE_PROVIDER_SITE_OTHER): Payer: Medicare Other | Admitting: Family Medicine

## 2017-04-20 VITALS — BP 101/58 | HR 87 | Temp 97.5°F | Resp 16 | Ht 62.0 in | Wt 141.5 lb

## 2017-04-20 DIAGNOSIS — R3915 Urgency of urination: Secondary | ICD-10-CM

## 2017-04-20 DIAGNOSIS — R946 Abnormal results of thyroid function studies: Secondary | ICD-10-CM | POA: Diagnosis not present

## 2017-04-20 DIAGNOSIS — F419 Anxiety disorder, unspecified: Secondary | ICD-10-CM

## 2017-04-20 DIAGNOSIS — R630 Anorexia: Secondary | ICD-10-CM | POA: Diagnosis not present

## 2017-04-20 DIAGNOSIS — Z794 Long term (current) use of insulin: Secondary | ICD-10-CM | POA: Diagnosis not present

## 2017-04-20 DIAGNOSIS — R634 Abnormal weight loss: Secondary | ICD-10-CM

## 2017-04-20 DIAGNOSIS — R7989 Other specified abnormal findings of blood chemistry: Secondary | ICD-10-CM

## 2017-04-20 DIAGNOSIS — F329 Major depressive disorder, single episode, unspecified: Secondary | ICD-10-CM | POA: Diagnosis not present

## 2017-04-20 DIAGNOSIS — E118 Type 2 diabetes mellitus with unspecified complications: Secondary | ICD-10-CM

## 2017-04-20 DIAGNOSIS — F32A Depression, unspecified: Secondary | ICD-10-CM

## 2017-04-20 LAB — COMPREHENSIVE METABOLIC PANEL
ALT: 10 U/L (ref 0–35)
AST: 14 U/L (ref 0–37)
Albumin: 3.8 g/dL (ref 3.5–5.2)
Alkaline Phosphatase: 99 U/L (ref 39–117)
BILIRUBIN TOTAL: 0.3 mg/dL (ref 0.2–1.2)
BUN: 13 mg/dL (ref 6–23)
CALCIUM: 9.2 mg/dL (ref 8.4–10.5)
CHLORIDE: 107 meq/L (ref 96–112)
CO2: 29 meq/L (ref 19–32)
Creatinine, Ser: 0.93 mg/dL (ref 0.40–1.20)
GFR: 63.57 mL/min (ref >60.00)
GLUCOSE: 150 mg/dL — AB (ref 70–99)
Potassium: 4.4 mEq/L (ref 3.5–5.1)
Sodium: 145 mEq/L (ref 135–145)
Total Protein: 6.5 g/dL (ref 6.0–8.3)

## 2017-04-20 LAB — CBC WITH DIFFERENTIAL/PLATELET
BASOS ABS: 0 10*3/uL (ref 0.0–0.1)
Basophils Relative: 0.6 % (ref 0.0–3.0)
EOS ABS: 0.3 10*3/uL (ref 0.0–0.7)
Eosinophils Relative: 4.1 % (ref 0.0–5.0)
HEMATOCRIT: 33.8 % — AB (ref 36.0–46.0)
HEMOGLOBIN: 11 g/dL — AB (ref 12.0–15.0)
LYMPHS PCT: 19.4 % (ref 12.0–46.0)
Lymphs Abs: 1.4 10*3/uL (ref 0.7–4.0)
MCHC: 32.7 g/dL (ref 30.0–36.0)
MCV: 81.9 fl (ref 78.0–100.0)
MONO ABS: 0.6 10*3/uL (ref 0.1–1.0)
Monocytes Relative: 8.8 % (ref 3.0–12.0)
Neutro Abs: 4.7 10*3/uL (ref 1.4–7.7)
Neutrophils Relative %: 67.1 % (ref 43.0–77.0)
PLATELETS: 285 10*3/uL (ref 150.0–400.0)
RBC: 4.13 Mil/uL (ref 3.87–5.11)
RDW: 17.9 % — AB (ref 11.5–15.5)
WBC: 7 10*3/uL (ref 4.0–10.5)

## 2017-04-20 LAB — C-REACTIVE PROTEIN: CRP: 1.5 mg/dL (ref 0.5–20.0)

## 2017-04-20 LAB — SEDIMENTATION RATE: Sed Rate: 41 mm/hr — ABNORMAL HIGH (ref 0–30)

## 2017-04-20 LAB — HEMOGLOBIN A1C: HEMOGLOBIN A1C: 7.6 % — AB (ref 4.6–6.5)

## 2017-04-20 LAB — TSH: TSH: 1.24 u[IU]/mL (ref 0.35–4.50)

## 2017-04-20 LAB — T4, FREE: FREE T4: 0.69 ng/dL (ref 0.60–1.60)

## 2017-04-20 MED ORDER — ESCITALOPRAM OXALATE 5 MG PO TABS: 5.0000 mg | ORAL_TABLET | Freq: Every day | ORAL | 0 refills | Status: DC

## 2017-04-20 NOTE — Progress Notes (Signed)
OFFICE VISIT  04/20/2017   CC:  Chief Complaint  Patient presents with  . Weight Loss    no appetite  . Fatigue   HPI:    Patient is a 71 y.o. Caucasian female who presents for wt loss/no appetite. Daughter with her today, is concerned b/c for about the last 8 mo she has been showing no motivation, no desire to even get out of bed most days, doesn't want to eat.    Denies depressed mood.  Admits to some anxiety problems "sometimes". No physical complaints.  Her fistula drain has been removed.  She denies any abd pain or dysphagia.  No sx's after she eats. Denies constipation.  Denies CP or SOB.  No dysuria or urinary frequency.  Has chronic urinary urgency--unchanged lately. No fevers.  No cough.    Glucoses at home a bit higher but not consistently (100s-300 max). Wt over last mo 139-142.   Past Medical History:  Diagnosis Date  . Age-related nuclear cataract of both eyes 2016   +cortical age related cataracts OU   . Allergy    seasonal  . Arthritis    hands  . Bilateral diabetic retinopathy (HCC) 2015   Dr. Zigmund Daniel  . Blood transfusion without reported diagnosis    when taking chemo  . Chronic combined systolic and diastolic congestive heart failure (HCC)    Takotsubo CM (resolved @5 /2018):  a. 08/2016 Echo: EF 30-35%, gr1DD, PASP 74mHg;  b. 11/2016 Echo: EF 40-45%, no rwma, nl RV fxn, mild TR, PASP 335mg.  . CKD (chronic kidney disease), stage II    GFR 60s  . 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  . Marland KitchenOPD (chronic obstructive pulmonary disease) ([facility]  . CVA (cerebral vascular accident) ([facility]   MRI did show a left-sided small ischemic stroke which was acute but likely incidental (MRI was done b/c pt had TIA sx's in hospital).  Neuro put her on plavix at that time.  . Diabetes mellitus with complication (HCC)    diab retinopathy OU (laser)  . Diverticulitis 05/2016  . GERD (gastroesophageal reflux disease)    protonix  . History of diverticulitis of colon    with abscess; required IR percutaneous drain placed 09/2016.  . Marland Kitchenyperlipidemia   . Hypertension   . Left bundle branch block (LBBB) 12/16/2016  . [place] cancer ([facility]   non-small cell lung ca, stage III in 05/2011; systemic chemotherapy concurrent with radiation followed by prophylactic cranial irradiation and has been observation since July of 2010 with no evidence for disease recurrence-released from onc f/u 12/2014 (needs annual cxr by PCP).  CXR 08/2015 stable.  . Mild cognitive impairment with memory loss    Likely from brain radiation therapy  . Non-obstructive CAD (coronary artery disease)    a. Cath 2006 preserved LV fxn, scattered irregularities without critical stenosis; b. 2008 stress echo negative for ischemia, but with hypertensive response; c. 08/2016 Cath: D1 25%, otw nl.  . Normocytic anemia 2018   Iron, vit B12, folate all normal 12/2016.  . Marland Kitchenpen-angle glaucoma 2016   Dr. BeShirley Muscat(bilateral)---responding to topical therapy  . Osteopenia 03/2016   03/2016 DEXA T-score -2.2  . Pneumonia    hospitalization 11/2016; hypoxemic resp failure--d/c'd home with home oxygen therapy  . Subclinical hyperthyroidism 2018  . Takotsubo cardiomyopathy    a. 08/2016 Echo: EF 30-35%, gr1DD, PASP 3829m;  b. 08/2016 Cath: nonobs Dzs;  c. 11/2016 Echo: EF 40-45%, no rwma, nl RV  fxn, mild TR, PASP 65mHg.  ECHO 02/2017 EF 50-55%, grd II DD---essentially resolved Takotsubo 02/2017.  . [place]es [place]   a. 08/2016 in setting of diverticulitis & pneumoperitoneum and Takotsubo CM -->prolonged QT, seen by EP-->avoid meds with potential for QT prolongation.    Past Surgical History:  Procedure Laterality Date  . ABDOMINAL HYSTERECTOMY  1997  . APPENDECTOMY    . CARDIAC CATHETERIZATION N/A 09/14/2016   Procedure: Left Heart Cath and Coronary Angiography;  Surgeon: TTroy Sine MD;  [facility];  Service:  Cardiovascular; Mild nonobstructive CAD with 85% smooth narrowing in the first diagonal branch of the LAD; 10% smooth narrowing of the ostial proximal left circumflex coronary artery; and a normal dominant RCA.  .Marland KitchenCARPAL TUNNEL RELEASE    . CATARACT EXTRACTION    . CHOLECYSTECTOMY  2002  . COLONOSCOPY  10/24/00   normal.  [facility] [place]cult [place]  . IR GENERIC HISTORICAL  09/19/2016   IR SINUS/FIST TUBE CHK-NON GI 09/19/2016 JCorrie Mckusick DO [facility]  . IR GENERIC HISTORICAL  10/06/2016   IR RADIOLOGIST EVAL & MGMT 10/06/2016 [facility]  . IR GENERIC HISTORICAL  10/26/2016   IR RADIOLOGIST EVAL & MGMT 10/26/2016 [facility]  . IR GENERIC HISTORICAL  11/03/2016   IR RADIOLOGIST EVAL & MGMT 11/03/2016 WArdis Rowan PA-C [facility]  . IR GENERIC HISTORICAL  11/24/2016   IR RADIOLOGIST EVAL & MGMT 11/24/2016 WArdis Rowan PA-C [facility]  . IR GENERIC HISTORICAL  12/05/2016   Fistula smaller/improving.  IR SINUS/FIST TUBE CHK-NON GI 12/05/2016 JSandi Mariscal MD [facility]  . IR GENERIC HISTORICAL  12/22/2016   IR RADIOLOGIST EVAL & MGMT 12/22/2016 MGreggory Keen MD [facility]  . IR RADIOLOGIST EVAL & MGMT  01/10/2017  . IR RADIOLOGIST EVAL & MGMT  02/09/2017  . IR SINUS/FIST TUBE CHK-NON GI  03/28/2017  . LEFT HEART CATHETERIZATION WITH CORONARY ANGIOGRAM N/A 05/30/2012   Procedure: LEFT HEART CATHETERIZATION WITH CORONARY ANGIOGRAM;  Surgeon: CBurnell Blanks MD;  [facility];  Service: Cardiovascular: Mild, non-obstructive CAD  . TRANSTHORACIC ECHOCARDIOGRAM  09/14/2016; 11/2016; 02/2017   08/2016: EF 30-35 %, Akinesis of the mid-apicalanteroseptal myocardium.  Grade I DD.  Mild pulm HTN.  Repeat echo 11/2016, EF 40-45%, no rwma, nl RV fxn, mild TR, PASP 315mg.  02/2017 EF 50-55%, grade II DD    Outpatient Medications Prior to Visit  Medication Sig Dispense Refill  . acetaminophen (TYLENOL) 500 MG tablet Take  500-1,000 mg by mouth every 6 (six) hours as needed for headache (or pain).     . Marland Kitchenlbuterol (PROAIR HFA) 108 (90 Base) MCG/ACT inhaler Inhale 2 puffs into the lungs every 6 (six) hours as needed for wheezing or shortness of breath. 18 g 3  . albuterol (PROVENTIL) (2.5 MG/3ML) 0.083% nebulizer solution USE ONE VIAL IN NEBULIZER EVERY 4 HOURS AS NEEDED FOR WHEEZING OR SHORTNESS OF BREATH 75 mL 3  . aspirin 81 MG chewable tablet Chew 81 mg by mouth daily.    . Marland Kitchentorvastatin (LIPITOR) 80 MG tablet TAKE ONE TABLET BY MOUTH ONCE DAILY (Patient taking differently: TAKE ONE TABLET BY MOUTH ONCE DAILY AT BEDTIME) 90 tablet 1  . cetirizine (ZYRTEC) 10 MG tablet Take 10 mg by mouth daily.    . clopidogrel (PLAVIX) 75 MG tablet Take 1 tablet (75 mg total) by mouth daily. 30 tablet 6  .  fluticasone (FLOVENT HFA) 220 MCG/ACT inhaler Inhale 2 puffs into the lungs 2 (two) times daily. 12 g 6  . furosemide (LASIX) 40 MG tablet Take 0.5 tablets (20 mg total) by mouth daily. 30 tablet 6  . glucose blood test strip Use to check blood sugar three times daily 100 each 12  . HUMULIN 70/30 KWIKPEN (70-30) 100 UNIT/ML PEN Per sliding scale, takes this qAM and aPM:  140-199 - 2 units, 200-250 - 4 units, 251-299 - 6 units,  300-349 - 8 units,  350 or above 10 units. Dispense syringes and needles as needed, Ok to switch to PEN if approved. Substitute to any brand approved. DX DM2, Code E11.65 15 mL 6  . metoprolol tartrate (LOPRESSOR) 25 MG tablet TAKE 25 MG ( 1 TABLET)  IN THE MORNING , TAKE 50 MG ( 2 TABLETS) AT BEDTIME 270 tablet 3  . nitroGLYCERIN (NITROSTAT) 0.4 MG SL tablet Place 1 tablet (0.4 mg total) under the tongue every 5 (five) minutes as needed. For chest pain 50 tablet 3  . ONE TOUCH ULTRA TEST test strip USE  STRIP TO CHECK GLUCOSE THREE TIMES DAILY 100 each 11  . pantoprazole (PROTONIX) 40 MG tablet Take 1 tablet (40 mg total) by mouth daily. 30 tablet 6  . potassium chloride (K-DUR) 10 MEQ tablet Take 1 tablet  (10 mEq total) by mouth daily. 30 tablet 6  . loratadine (CLARITIN) 10 MG tablet Take 10 mg by mouth at bedtime.     . metFORMIN (GLUCOPHAGE) 500 MG tablet Take 500 mg by mouth 2 (two) times daily with a meal. STOP TAKING UNTIL FURTHER NOTICE--PT TOO HIGH RISK OF ACIDOSIS AS OF 03/06/17.--PM     No facility-administered medications prior to visit.     Allergies  Allergen Reactions  . Lactose Intolerance (Gi) Diarrhea  . Ibuprofen Other (See Comments)    REACTION: Anxiousness, hyperventilates    ROS As per HPI  PE: Blood pressure (!) 101/58, pulse 87, temperature 97.5 F (36.4 C), temperature source Oral, resp. rate 16, height 5' 2"  (1.575 m), weight 141 lb 8 oz (64.2 kg), SpO2 98 %. Gen: Alert, well appearing.  Patient is oriented to person, place, time, and situation. AFFECT: pleasant, lucid thought and speech. XAJ:OINO: no injection, icteris, swelling, or exudate.  EOMI, PERRLA. Mouth: lips without lesion/swelling.  Oral mucosa pink and moist. Oropharynx without erythema, exudate, or swelling.  CV: RRR, no m/r/g.   LUNGS: CTA bilat, nonlabored resps, good aeration in all lung fields. ABD: soft, NT/ND, BS normal.  No bruit or mass or HSM. EXT: no clubbing, cyanosis, or edema.   LABS:  Lab Results  Component Value Date   TSH 0.14 (L) 12/27/2016   Lab Results  Component Value Date   WBC 7.2 12/27/2016   HGB 11.0 (L) 12/27/2016   HCT 32.7 (L) 12/27/2016   MCV 84.8 12/27/2016   PLT 205.0 12/27/2016   Lab Results  Component Value Date   CREATININE 0.79 12/27/2016   BUN 10 12/27/2016   NA 143 12/27/2016   K 3.6 12/27/2016   CL 104 12/27/2016   CO2 33 (H) 12/27/2016   Lab Results  Component Value Date   ALT 15 12/06/2016   AST 20 12/06/2016   ALKPHOS 63 12/06/2016   BILITOT 0.5 12/06/2016   Lab Results  Component Value Date   CHOL 65 12/02/2016   Lab Results  Component Value Date   HDL 34 (L) 12/02/2016   Lab Results  Component Value  Date   LDLCALC 23  12/02/2016   Lab Results  Component Value Date   TRIG 42 12/02/2016   Lab Results  Component Value Date   CHOLHDL 1.9 12/02/2016   Lab Results  Component Value Date   HGBA1C 6.5 (H) 12/02/2016   IMPRESSION AND PLAN:  1) Poor appetite; wt is actually pretty stable. She does sound like her sx's are largely due to anxiety and depression. Will check lab panel today: CBC, CMET, TSH, T4 and T3, ESR, CRP, and HbA1c. Start escitalopram 69m qd.  Therapeutic expectations and side effect profile of medication discussed today.  Patient's questions answered.  An After Visit Summary was printed and given to the patient.  FOLLOW UP: Return in about 4 weeks (around 05/18/2017) for f/u anx/dep.  Signed:  PCrissie Sickles MD           04/20/2017

## 2017-04-21 LAB — T3: T3, Total: 94.7 ng/dL (ref 76–181)

## 2017-04-29 ENCOUNTER — Other Ambulatory Visit: Payer: Self-pay | Admitting: Family Medicine

## 2017-05-03 ENCOUNTER — Other Ambulatory Visit: Payer: Self-pay | Admitting: *Deleted

## 2017-05-03 DIAGNOSIS — E782 Mixed hyperlipidemia: Secondary | ICD-10-CM

## 2017-05-03 DIAGNOSIS — E785 Hyperlipidemia, unspecified: Secondary | ICD-10-CM

## 2017-05-03 MED ORDER — ATORVASTATIN CALCIUM 80 MG PO TABS: 80.0000 mg | ORAL_TABLET | Freq: Every day | ORAL | 1 refills | Status: DC

## 2017-05-03 NOTE — Telephone Encounter (Signed)
[facility] [address]  RF request for atorvastatin LOV: 04/20/17 Next ov: 05/18/17 Last written: 07/18/16 #90 w/ 1RF

## 2017-05-05 ENCOUNTER — Other Ambulatory Visit: Payer: Self-pay | Admitting: *Deleted

## 2017-05-05 MED ORDER — ALBUTEROL SULFATE (2.5 MG/3ML) 0.083% IN NEBU: INHALATION_SOLUTION | RESPIRATORY_TRACT | 3 refills | Status: DC

## 2017-05-05 NOTE — Telephone Encounter (Signed)
Refill request for albuterol neb solution. Needs new Rx with Dx for billing purposes. LOV: 04/20/17 Dx: J44.1 COPD

## 2017-05-15 ENCOUNTER — Other Ambulatory Visit: Payer: Self-pay | Admitting: Family Medicine

## 2017-05-18 ENCOUNTER — Encounter: Payer: Self-pay | Admitting: Family Medicine

## 2017-05-18 ENCOUNTER — Ambulatory Visit (INDEPENDENT_AMBULATORY_CARE_PROVIDER_SITE_OTHER): Payer: Medicare Other | Admitting: Family Medicine

## 2017-05-18 VITALS — BP 116/60 | HR 76 | Temp 98.0°F | Resp 16 | Ht 62.0 in | Wt 144.0 lb

## 2017-05-18 DIAGNOSIS — F330 Major depressive disorder, recurrent, mild: Secondary | ICD-10-CM

## 2017-05-18 DIAGNOSIS — F411 Generalized anxiety disorder: Secondary | ICD-10-CM | POA: Diagnosis not present

## 2017-05-18 MED ORDER — ESCITALOPRAM OXALATE 5 MG PO TABS: 5.0000 mg | ORAL_TABLET | Freq: Every day | ORAL | 0 refills | Status: DC

## 2017-05-18 NOTE — Progress Notes (Signed)
OFFICE VISIT  05/18/2017   CC:  Chief Complaint  Patient presents with  . Follow-up    Anxiety and Depression   HPI:    Patient is a 71 y.o. Caucasian female who presents for 4 week f/u anxiety and depression. Main symptoms were anhedonia, poor motivation and energy, no appetite.  Lab w/u was normal. I started her on lexapro 5mg  qd at that time. Pt states she does not know if she is improved. Much more interactive, eating more, getting out of bed and getting dressed on her own more days now, much less irritable.  Daughter estimates about 75% improvement in mood and general anxiety. No side effects from lexapro.   Past Medical History:  Diagnosis Date  . Age-related nuclear cataract of both eyes 2016   +cortical age related cataracts OU   . Allergy    seasonal  . Arthritis    hands  . Bilateral diabetic retinopathy (HCC) 2015   Dr. Zigmund Daniel  . Blood transfusion without reported diagnosis    when taking chemo  . Chronic combined systolic and diastolic congestive heart failure (HCC)    Takotsubo CM (resolved @5 /2018):  a. 08/2016 Echo: EF 30-35%, gr1DD, PASP 27mmHg;  b. 11/2016 Echo: EF 40-45%, no rwma, nl RV fxn, mild TR, PASP 76mmHg.  . CKD (chronic kidney disease), stage II    GFR 60s  . 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  Marland Kitchen COPD (chronic obstructive pulmonary disease) ([place])   . CVA (cerebral vascular accident) ([place]) 09/2016   MRI did show a left-sided small ischemic stroke which was acute but likely incidental (MRI was done b/c pt had TIA sx's in hospital).  Neuro put her on plavix at that time.  . Diabetes mellitus with complication (HCC)    diab retinopathy OU (laser)  . Diverticulitis 05/2016  . GERD (gastroesophageal reflux disease)    protonix  . History of diverticulitis of colon    with abscess; required IR percutaneous drain placed 09/2016.  Marland Kitchen Hyperlipidemia   . Hypertension   . Left bundle branch block (LBBB)  12/16/2016  . Lung cancer ([place])    non-small cell lung ca, stage III in 05/2011; systemic chemotherapy concurrent with radiation followed by prophylactic cranial irradiation and has been observation since July of 2010 with no evidence for disease recurrence-released from onc f/u 12/2014 (needs annual cxr by PCP).  CXR 08/2015 stable.  . Mild cognitive impairment with memory loss    Likely from brain radiation therapy  . Non-obstructive CAD (coronary artery disease)    a. Cath 2006 preserved LV fxn, scattered irregularities without critical stenosis; b. 2008 stress echo negative for ischemia, but with hypertensive response; c. 08/2016 Cath: D1 25%, otw nl.  . Normocytic anemia 2018   Iron, vit B12, folate all normal 12/2016.  Marland Kitchen Open-angle glaucoma 2016   Dr. Shirley Muscat, (bilateral)---responding to topical therapy  . Osteopenia 03/2016   03/2016 DEXA T-score -2.2  . Pneumonia    hospitalization 11/2016; hypoxemic resp failure--d/c'd home with home oxygen therapy  . Subclinical hyperthyroidism 2018  . Takotsubo cardiomyopathy    a. 08/2016 Echo: EF 30-35%, gr1DD, PASP 63mmHg;  b. 08/2016 Cath: nonobs Dzs;  c. 11/2016 Echo: EF 40-45%, no rwma, nl RV fxn, mild TR, PASP 23mmHg.  ECHO 02/2017 EF 50-55%, grd II DD---essentially resolved Takotsubo 02/2017.  . 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 08/2016 in setting of diverticulitis & pneumoperitoneum and Takotsubo CM -->prolonged QT, seen by  EP-->avoid meds with potential for QT prolongation.    Past Surgical History:  Procedure Laterality Date  . ABDOMINAL HYSTERECTOMY  1997  . APPENDECTOMY    . CARDIAC CATHETERIZATION N/A 09/14/2016   Procedure: Left Heart Cath and Coronary Angiography;  Surgeon: Troy Sine, MD;  Location: [place] [facility];  Service: Cardiovascular; Mild nonobstructive CAD with 85% smooth narrowing in the first diagonal branch of the LAD; 10% smooth narrowing of the ostial proximal left circumflex coronary artery; and a normal  dominant RCA.  Marland Kitchen CARPAL TUNNEL RELEASE    . CATARACT EXTRACTION    . CHOLECYSTECTOMY  2002  . COLONOSCOPY  10/24/00   normal.  [employer] hemoccult testing via [place] 06/18/15 was NEG  . IR GENERIC HISTORICAL  09/19/2016   IR SINUS/FIST TUBE CHK-NON GI 09/19/2016 Corrie Mckusick, DO [facility]  . IR GENERIC HISTORICAL  10/06/2016   IR RADIOLOGIST EVAL & MGMT 10/06/2016 [facility]  . IR GENERIC HISTORICAL  10/26/2016   IR RADIOLOGIST EVAL & MGMT 10/26/2016 [facility]  . IR GENERIC HISTORICAL  11/03/2016   IR RADIOLOGIST EVAL & MGMT 11/03/2016 Ardis Rowan, PA-C [facility]  . IR GENERIC HISTORICAL  11/24/2016   IR RADIOLOGIST EVAL & MGMT 11/24/2016 Ardis Rowan, PA-C [facility]  . IR GENERIC HISTORICAL  12/05/2016   Fistula smaller/improving.  IR SINUS/FIST TUBE CHK-NON GI 12/05/2016 Sandi Mariscal, MD [facility]  . IR GENERIC HISTORICAL  12/22/2016   IR RADIOLOGIST EVAL & MGMT 12/22/2016 Greggory Keen, MD [facility]  . IR RADIOLOGIST EVAL & MGMT  01/10/2017  . IR RADIOLOGIST EVAL & MGMT  02/09/2017  . IR SINUS/FIST TUBE CHK-NON GI  03/28/2017  . LEFT HEART CATHETERIZATION WITH CORONARY ANGIOGRAM N/A 05/30/2012   Procedure: LEFT HEART CATHETERIZATION WITH CORONARY ANGIOGRAM;  Surgeon: Burnell Blanks, MD;  Location: [facility];  Service: Cardiovascular: Mild, non-obstructive CAD  . TRANSTHORACIC ECHOCARDIOGRAM  09/14/2016; 11/2016; 02/2017   08/2016: EF 30-35 %, Akinesis of the mid-apicalanteroseptal myocardium.  Grade I DD.  Mild pulm HTN.  Repeat echo 11/2016, EF 40-45%, no rwma, nl RV fxn, mild TR, PASP 72mmHg.  02/2017 EF 50-55%, grade II DD    Outpatient Medications Prior to Visit  Medication Sig Dispense Refill  . acetaminophen (TYLENOL) 500 MG tablet Take 500-1,000 mg by mouth every 6 (six) hours as needed for headache (or pain).     Marland Kitchen albuterol (PROAIR HFA) 108 (90 Base) MCG/ACT inhaler Inhale 2 puffs into the lungs every 6 (six) hours as needed for  wheezing or shortness of breath. 18 g 3  . albuterol (PROVENTIL) (2.5 MG/3ML) 0.083% nebulizer solution USE ONE VIAL IN NEBULIZER EVERY 4 HOURS AS NEEDED FOR WHEEZING OR SHORTNESS OF BREATH 75 mL 3  . aspirin 81 MG chewable tablet Chew 81 mg by mouth daily.    Marland Kitchen atorvastatin (LIPITOR) 80 MG tablet Take 1 tablet (80 mg total) by mouth daily. 90 tablet 1  . cetirizine (ZYRTEC) 10 MG tablet Take 10 mg by mouth daily.    . clopidogrel (PLAVIX) 75 MG tablet Take 1 tablet (75 mg total) by mouth daily. 30 tablet 6  . fluticasone (FLOVENT HFA) 220 MCG/ACT inhaler Inhale 2 puffs into the lungs 2 (two) times daily. 12 g 6  . furosemide (LASIX) 40 MG tablet Take 0.5 tablets (20 mg total) by mouth daily. 30 tablet 6  . glucose blood test strip Use to check blood sugar three  times daily 100 each 12  . HUMULIN 70/30 KWIKPEN (70-30) 100 UNIT/ML PEN Per sliding scale, takes this qAM and aPM:  140-199 - 2 units, 200-250 - 4 units, 251-299 - 6 units,  300-349 - 8 units,  350 or above 10 units. Dispense syringes and needles as needed, Ok to switch to PEN if approved. Substitute to any brand approved. DX DM2, Code E11.65 15 mL 6  . metoprolol tartrate (LOPRESSOR) 25 MG tablet TAKE 25 MG ( 1 TABLET)  IN THE MORNING , TAKE 50 MG ( 2 TABLETS) AT BEDTIME 270 tablet 3  . nitroGLYCERIN (NITROSTAT) 0.4 MG SL tablet Place 1 tablet (0.4 mg total) under the tongue every 5 (five) minutes as needed. For chest pain 50 tablet 3  . ONE TOUCH ULTRA TEST test strip USE  STRIP TO CHECK GLUCOSE THREE TIMES DAILY 100 each 11  . ONETOUCH DELICA LANCETS 48N MISC TEST 3 TIMES A DAY 100 each 11  . pantoprazole (PROTONIX) 40 MG tablet Take 1 tablet (40 mg total) by mouth daily. 30 tablet 6  . potassium chloride (K-DUR) 10 MEQ tablet Take 1 tablet (10 mEq total) by mouth daily. 30 tablet 6  . escitalopram (LEXAPRO) 5 MG tablet Take 1 tablet (5 mg total) by mouth daily. 30 tablet 0   No facility-administered medications prior to visit.      Allergies  Allergen Reactions  . Lactose Intolerance (Gi) Diarrhea  . Ibuprofen Other (See Comments)    REACTION: Anxiousness, hyperventilates    ROS As per HPI  PE: Blood pressure 116/60, pulse 76, temperature 98 F (36.7 C), temperature source Oral, resp. rate 16, height 5\' 2"  (1.575 m), weight 144 lb (65.3 kg), SpO2 95 %. Body mass index is 26.34 kg/m.  Gen: Alert, well appearing.  Patient is oriented to person, place, time, and situation. AFFECT: pleasant, lucid thought and speech. No further exam today.  LABS:  Lab Results  Component Value Date   VITAMINB12 394 12/27/2016    Lab Results  Component Value Date   TSH 1.24 04/20/2017   Lab Results  Component Value Date   WBC 7.0 04/20/2017   HGB 11.0 (L) 04/20/2017   HCT 33.8 (L) 04/20/2017   MCV 81.9 04/20/2017   PLT 285.0 04/20/2017   Lab Results  Component Value Date   IRON 49 12/27/2016   IRON 41 (L) 12/27/2016   TIBC 209 (L) 12/27/2016   FERRITIN 171.6 12/27/2016   Lab Results  Component Value Date   CREATININE 0.93 04/20/2017   BUN 13 04/20/2017   NA 145 04/20/2017   K 4.4 04/20/2017   CL 107 04/20/2017   CO2 29 04/20/2017   Lab Results  Component Value Date   ALT 10 04/20/2017   AST 14 04/20/2017   ALKPHOS 99 04/20/2017   BILITOT 0.3 04/20/2017   Lab Results  Component Value Date   CHOL 65 12/02/2016   Lab Results  Component Value Date   HDL 34 (L) 12/02/2016   Lab Results  Component Value Date   LDLCALC 23 12/02/2016   Lab Results  Component Value Date   TRIG 42 12/02/2016   Lab Results  Component Value Date   CHOLHDL 1.9 12/02/2016   Lab Results  Component Value Date   CRP 1.5 04/20/2017   Lab Results  Component Value Date   ESRSEDRATE 41 (H) 04/20/2017   Lab Results  Component Value Date   HGBA1C 7.6 (H) 04/20/2017   IMPRESSION AND PLAN:  1)  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  The current medical regimen is effective;  continue present plan and medications.  An After Visit Summary was printed and given to the patient.  FOLLOW UP: Return in about 3 months (around 08/18/2017) for routine chronic illness f/u (30 min).  Signed:  Crissie Sickles, MD           05/18/2017

## 2017-06-10 ENCOUNTER — Emergency Department (HOSPITAL_COMMUNITY)
Admission: EM | Admit: 2017-06-10 | Discharge: 2017-06-10 | Disposition: A | Discharge status: Home / Self Care | Payer: Medicare Other | Attending: Emergency Medicine | Admitting: Emergency Medicine

## 2017-06-10 ENCOUNTER — Emergency Department (HOSPITAL_COMMUNITY): Payer: Medicare Other

## 2017-06-10 ENCOUNTER — Encounter (HOSPITAL_COMMUNITY): Payer: Self-pay | Admitting: Cardiology

## 2017-06-10 DIAGNOSIS — Z87891 Personal history of nicotine dependence: Secondary | ICD-10-CM | POA: Insufficient documentation

## 2017-06-10 DIAGNOSIS — W010XXA Fall on same level from slipping, tripping and stumbling without subsequent striking against object, initial encounter: Secondary | ICD-10-CM | POA: Insufficient documentation

## 2017-06-10 DIAGNOSIS — Z794 Long term (current) use of insulin: Secondary | ICD-10-CM | POA: Diagnosis not present

## 2017-06-10 DIAGNOSIS — E11319 Type 2 diabetes mellitus with unspecified diabetic retinopathy without macular edema: Secondary | ICD-10-CM | POA: Diagnosis not present

## 2017-06-10 DIAGNOSIS — Y939 Activity, unspecified: Secondary | ICD-10-CM | POA: Diagnosis not present

## 2017-06-10 DIAGNOSIS — S42201A Unspecified fracture of upper end of right humerus, initial encounter for closed fracture: Secondary | ICD-10-CM | POA: Insufficient documentation

## 2017-06-10 DIAGNOSIS — J441 Chronic obstructive pulmonary disease with (acute) exacerbation: Secondary | ICD-10-CM | POA: Diagnosis not present

## 2017-06-10 DIAGNOSIS — Y929 Unspecified place or not applicable: Secondary | ICD-10-CM | POA: Insufficient documentation

## 2017-06-10 DIAGNOSIS — S199XXA Unspecified injury of neck, initial encounter: Secondary | ICD-10-CM | POA: Diagnosis not present

## 2017-06-10 DIAGNOSIS — I130 Hypertensive heart and chronic kidney disease with heart failure and stage 1 through stage 4 chronic kidney disease, or unspecified chronic kidney disease: Secondary | ICD-10-CM | POA: Insufficient documentation

## 2017-06-10 DIAGNOSIS — Z23 Encounter for immunization: Secondary | ICD-10-CM | POA: Diagnosis not present

## 2017-06-10 DIAGNOSIS — Y999 Unspecified external cause status: Secondary | ICD-10-CM | POA: Diagnosis not present

## 2017-06-10 DIAGNOSIS — E1122 Type 2 diabetes mellitus with diabetic chronic kidney disease: Secondary | ICD-10-CM | POA: Diagnosis not present

## 2017-06-10 DIAGNOSIS — T148XXA Other injury of unspecified body region, initial encounter: Secondary | ICD-10-CM | POA: Diagnosis not present

## 2017-06-10 DIAGNOSIS — S4991XA Unspecified injury of right shoulder and upper arm, initial encounter: Secondary | ICD-10-CM | POA: Diagnosis present

## 2017-06-10 DIAGNOSIS — M79601 Pain in right arm: Secondary | ICD-10-CM | POA: Diagnosis not present

## 2017-06-10 DIAGNOSIS — M25511 Pain in right shoulder: Secondary | ICD-10-CM | POA: Diagnosis not present

## 2017-06-10 DIAGNOSIS — Z7902 Long term (current) use of antithrombotics/antiplatelets: Secondary | ICD-10-CM | POA: Insufficient documentation

## 2017-06-10 DIAGNOSIS — N182 Chronic kidney disease, stage 2 (mild): Secondary | ICD-10-CM | POA: Insufficient documentation

## 2017-06-10 DIAGNOSIS — M542 Cervicalgia: Secondary | ICD-10-CM | POA: Diagnosis not present

## 2017-06-10 DIAGNOSIS — S52501A Unspecified fracture of the lower end of right radius, initial encounter for closed fracture: Secondary | ICD-10-CM | POA: Diagnosis not present

## 2017-06-10 DIAGNOSIS — S42291A Other displaced fracture of upper end of right humerus, initial encounter for closed fracture: Secondary | ICD-10-CM

## 2017-06-10 DIAGNOSIS — I5042 Chronic combined systolic (congestive) and diastolic (congestive) heart failure: Secondary | ICD-10-CM | POA: Insufficient documentation

## 2017-06-10 DIAGNOSIS — R51 Headache: Secondary | ICD-10-CM | POA: Diagnosis not present

## 2017-06-10 DIAGNOSIS — Z7982 Long term (current) use of aspirin: Secondary | ICD-10-CM | POA: Diagnosis not present

## 2017-06-10 DIAGNOSIS — S59901A Unspecified injury of right elbow, initial encounter: Secondary | ICD-10-CM | POA: Diagnosis not present

## 2017-06-10 DIAGNOSIS — S0990XA Unspecified injury of head, initial encounter: Secondary | ICD-10-CM | POA: Diagnosis not present

## 2017-06-10 DIAGNOSIS — Z79899 Other long term (current) drug therapy: Secondary | ICD-10-CM | POA: Insufficient documentation

## 2017-06-10 MED ORDER — TETANUS-DIPHTH-ACELL PERTUSSIS 5-2.5-18.5 LF-MCG/0.5 IM SUSP
0.5000 mL | Freq: Once | INTRAMUSCULAR | Status: AC
  Administered 2017-06-10: 0.5 mL via INTRAMUSCULAR

## 2017-06-10 MED ORDER — ONDANSETRON 4 MG PO TBDP
4.0000 mg | ORAL_TABLET | Freq: Once | ORAL | Status: AC
  Administered 2017-06-10: 4 mg via ORAL

## 2017-06-10 MED ORDER — ONDANSETRON 4 MG PO TBDP
ORAL_TABLET | ORAL | Status: AC
  Filled 2017-06-10: qty 1

## 2017-06-10 MED ORDER — MORPHINE SULFATE (PF) 4 MG/ML IV SOLN
4.0000 mg | Freq: Once | INTRAVENOUS | Status: AC
  Administered 2017-06-10: 4 mg via INTRAVENOUS
  Filled 2017-06-10: qty 1

## 2017-06-10 MED ORDER — HYDROCODONE-ACETAMINOPHEN 5-325 MG PO TABS: 2.0000 | ORAL_TABLET | ORAL | 0 refills | Status: DC | PRN

## 2017-06-10 NOTE — ED Provider Notes (Signed)
[facility] DEPT Provider Note   CSN: [id number] Arrival date & time: 06/10/17  1635     History   Chief Complaint Chief Complaint  Patient presents with  . Fall    HPI [name] is a 71 y.o. female.  HPI   Pt is a 71 y.o. Female with a past history of diabetes, cardiovascular disease, and cardiac arrest in November 2017 presenting for a fall just prior to arrival. This was a witnessed event. Pt reports getting out of her sister's car and slipping on wet pavement. Pt denies any dizziness, lightheadedness, loss of consciousness, palpitations, chest pain, or any other presyncopal event. Pt denies weakness of the lower extremities preceding the event. Pt reports falling onto the right shoulder but did not hit her head. Pt does not routinely have falls or presyncopal episodes. Pt reports nausea and one episode of emesis prior to arrival but has otherwise been feeling well. Pt is on Plavix.  Pt received 75 mcg fentanyl per EMS and pain was controlled on arrival.  Past Medical History:  Diagnosis Date  . Age-related nuclear cataract of both eyes 2016   +cortical age related cataracts OU   . Allergy    seasonal  . Arthritis    hands  . Bilateral diabetic retinopathy (HCC) 2015   Dr. Zigmund Daniel  . Blood transfusion without reported diagnosis    when taking chemo  . Chronic combined systolic and diastolic congestive heart failure (HCC)    Takotsubo CM (resolved @5 /2018):  a. 08/2016 Echo: EF 30-35%, gr1DD, PASP 23mmHg;  b. 11/2016 Echo: EF 40-45%, no rwma, nl RV fxn, mild TR, PASP 86mmHg.  . CKD (chronic kidney disease), stage II    GFR 60s  . 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  Marland Kitchen COPD (chronic obstructive pulmonary disease) ([place])   . CVA (cerebral vascular accident) ([place]) 09/2016   MRI did show a left-sided small ischemic stroke which was acute but likely incidental (MRI was done b/c pt had TIA sx's in hospital).  Neuro put her on  plavix at that time.  . Diabetes mellitus with complication (HCC)    diab retinopathy OU (laser)  . Diverticulitis 05/2016  . GERD (gastroesophageal reflux disease)    protonix  . History of diverticulitis of colon    with abscess; required IR percutaneous drain placed 09/2016.  Marland Kitchen Hyperlipidemia   . Hypertension   . Left bundle branch block (LBBB) 12/16/2016  . Lung cancer ([place])    non-small cell lung ca, stage III in 05/2011; systemic chemotherapy concurrent with radiation followed by prophylactic cranial irradiation and has been observation since July of 2010 with no evidence for disease recurrence-released from onc f/u 12/2014 (needs annual cxr by PCP).  CXR 08/2015 stable.  . Mild cognitive impairment with memory loss    Likely from brain radiation therapy  . Non-obstructive CAD (coronary artery disease)    a. Cath 2006 preserved LV fxn, scattered irregularities without critical stenosis; b. 2008 stress echo negative for ischemia, but with hypertensive response; c. 08/2016 Cath: D1 25%, otw nl.  . Normocytic anemia 2018   Iron, vit B12, folate all normal 12/2016.  Marland Kitchen Open-angle glaucoma 2016   Dr. Shirley Muscat, (bilateral)---responding to topical therapy  . Osteopenia 03/2016   03/2016 DEXA T-score -2.2  . Pneumonia    hospitalization 11/2016; hypoxemic resp failure--d/c'd home with home oxygen therapy  . Subclinical hyperthyroidism 2018  . Takotsubo cardiomyopathy    a. 08/2016 Echo: EF  30-35%, gr1DD, PASP 58mmHg;  b. 08/2016 Cath: nonobs Dzs;  c. 11/2016 Echo: EF 40-45%, no rwma, nl RV fxn, mild TR, PASP 47mmHg.  ECHO 02/2017 EF 50-55%, grd II DD---essentially resolved Takotsubo 02/2017.  . 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 08/2016 in setting of diverticulitis & pneumoperitoneum and Takotsubo CM -->prolonged QT, seen by EP-->avoid meds with potential for QT prolongation.    Patient Active Problem List   Diagnosis Date Noted  . Preoperative cardiovascular examination 01/12/2017  .  Diarrhea 12/02/2016  . Acute respiratory failure with hypoxia ([place]) 12/02/2016  . CAP (community acquired pneumonia) 12/01/2016  . Essential hypertension 12/01/2016  . Acute encephalopathy 12/01/2016  . Chronic combined systolic and diastolic congestive heart failure ([place]) 12/01/2016  . Cerebrovascular accident (CVA) ([place])   . Hypokalemia 09/17/2016  . Hypertension associated with chronic kidney disease due to type 2 diabetes mellitus ([place]) 09/17/2016  . Cardiac arrest with ventricular fibrillation ([place]) 09/14/2016  . Takotsubo cardiomyopathy 09/14/2016  . Colonic diverticular abscess   . Acute respiratory failure ([place])   . Diverticulitis of large intestine with perforation and abscess 09/11/2016  . Debilitated patient 07/08/2016  . Arterial hypotension   . Controlled type 2 diabetes mellitus with hyperglycemia, with long-term current use of insulin ([place]) 06/14/2016  . Hyperlipidemia 06/14/2016  . COPD exacerbation ([place]) 10/18/2013  . Non-occlusive coronary artery disease 06/06/2012  . TOBACCO USE, QUIT 07/29/2009  . Uncontrolled type 2 diabetes mellitus with complication ([place]) 50/35/4656  . GERD (gastroesophageal reflux disease) 04/24/2007    Past Surgical History:  Procedure Laterality Date  . ABDOMINAL HYSTERECTOMY  1997  . APPENDECTOMY    . CARDIAC CATHETERIZATION N/A 09/14/2016   Procedure: Left Heart Cath and Coronary Angiography;  Surgeon: Troy Sine, MD;  Location: [facility];  Service: Cardiovascular; Mild nonobstructive CAD with 85% smooth narrowing in the first diagonal branch of the LAD; 10% smooth narrowing of the ostial proximal left circumflex coronary artery; and a normal dominant RCA.  Marland Kitchen CARPAL TUNNEL RELEASE    . CATARACT EXTRACTION    . CHOLECYSTECTOMY  2002  . COLONOSCOPY  10/24/00   normal.  [employer] hemoccult testing via [place] 06/18/15 was NEG  . IR GENERIC HISTORICAL  09/19/2016   IR SINUS/FIST TUBE CHK-NON GI 09/19/2016 Corrie Mckusick, DO [facility]  . IR GENERIC HISTORICAL  10/06/2016   IR RADIOLOGIST EVAL & MGMT 10/06/2016 [facility]  . IR GENERIC HISTORICAL  10/26/2016   IR RADIOLOGIST EVAL & MGMT 10/26/2016 [facility]  . IR GENERIC HISTORICAL  11/03/2016   IR RADIOLOGIST EVAL & MGMT 11/03/2016 Ardis Rowan, PA-C [facility]  . IR GENERIC HISTORICAL  11/24/2016   IR RADIOLOGIST EVAL & MGMT 11/24/2016 Ardis Rowan, PA-C [facility]  . IR GENERIC HISTORICAL  12/05/2016   Fistula smaller/improving.  IR SINUS/FIST TUBE CHK-NON GI 12/05/2016 Sandi Mariscal, MD [facility]  . IR GENERIC HISTORICAL  12/22/2016   IR RADIOLOGIST EVAL & MGMT 12/22/2016 Greggory Keen, MD [facility]  . IR RADIOLOGIST EVAL & MGMT  01/10/2017  . IR RADIOLOGIST EVAL & MGMT  02/09/2017  . IR SINUS/FIST TUBE CHK-NON GI  03/28/2017  . LEFT HEART CATHETERIZATION WITH CORONARY ANGIOGRAM N/A 05/30/2012   Procedure: LEFT HEART CATHETERIZATION WITH CORONARY ANGIOGRAM;  Surgeon: Burnell Blanks, MD;  Location: [facility] CATH LAB;  Service: Cardiovascular: Mild, non-obstructive CAD  . TRANSTHORACIC ECHOCARDIOGRAM  09/14/2016; 11/2016; 02/2017   08/2016: EF 30-35 %,  Akinesis of the mid-apicalanteroseptal myocardium.  Grade I DD.  Mild pulm HTN.  Repeat echo 11/2016, EF 40-45%, no rwma, nl RV fxn, mild TR, PASP 52mmHg.  02/2017 EF 50-55%, grade II DD    OB History    No data available       Home Medications    Prior to Admission medications   Medication Sig Start Date End Date Taking? Authorizing Provider  acetaminophen (TYLENOL) 500 MG tablet Take 500-1,000 mg by mouth every 6 (six) hours as needed for headache (or pain).     [provider]  albuterol (PROAIR HFA) 108 (90 Base) MCG/ACT inhaler Inhale 2 puffs into the lungs every 6 (six) hours as needed for wheezing or shortness of breath. 02/16/17   McGowen, [name], MD  albuterol (PROVENTIL) (2.5 MG/3ML) 0.083% nebulizer solution USE ONE VIAL IN NEBULIZER EVERY 4 HOURS AS NEEDED FOR  WHEEZING OR SHORTNESS OF BREATH 05/05/17   McGowen, [name], MD  aspirin 81 MG chewable tablet Chew 81 mg by mouth daily.    [provider]  atorvastatin (LIPITOR) 80 MG tablet Take 1 tablet (80 mg total) by mouth daily. 05/03/17   McGowen, [name], MD  cetirizine (ZYRTEC) 10 MG tablet Take 10 mg by mouth daily.    [provider]  clopidogrel (PLAVIX) 75 MG tablet Take 1 tablet (75 mg total) by mouth daily. 11/21/16   McGowen, [name], MD  escitalopram (LEXAPRO) 5 MG tablet Take 1 tablet (5 mg total) by mouth daily. 05/18/17   McGowen, [name], MD  fluticasone (FLOVENT HFA) 220 MCG/ACT inhaler Inhale 2 puffs into the lungs 2 (two) times daily. 02/16/17   McGowen, [name], MD  furosemide (LASIX) 40 MG tablet Take 0.5 tablets (20 mg total) by mouth daily. 01/26/17   Leonie Man, MD  glucose blood test strip Use to check blood sugar three times daily 02/17/17   McGowen, [name], MD  HUMULIN 70/30 KWIKPEN (70-30) 100 UNIT/ML PEN Per sliding scale, takes this qAM and aPM:  140-199 - 2 units, 200-250 - 4 units, 251-299 - 6 units,  300-349 - 8 units,  350 or above 10 units. Dispense syringes and needles as needed, Ok to switch to PEN if approved. Substitute to any brand approved. DX DM2, Code E11.65 01/18/17   McGowen, [name], MD  HYDROcodone-acetaminophen (NORCO/VICODIN) 5-325 MG tablet Take 2 tablets by mouth every 4 (four) hours as needed. 06/10/17   Noemi Chapel, MD  metoprolol tartrate (LOPRESSOR) 25 MG tablet TAKE 25 MG ( 1 TABLET)  IN THE MORNING , TAKE 50 MG ( 2 TABLETS) AT BEDTIME 03/13/17   Leonie Man, MD  nitroGLYCERIN (NITROSTAT) 0.4 MG SL tablet Place 1 tablet (0.4 mg total) under the tongue every 5 (five) minutes as needed. For chest pain 12/31/12   Swords, Darrick Penna, MD  ONE TOUCH ULTRA TEST test strip USE  STRIP TO CHECK GLUCOSE THREE TIMES DAILY 03/13/17   McGowen, [name], MD  [facility] DELICA LANCETS 16X MISC TEST 3 TIMES A DAY 05/01/17   McGowen, [name], MD    pantoprazole (PROTONIX) 40 MG tablet Take 1 tablet (40 mg total) by mouth daily. 01/16/17   McGowen, [name], MD  potassium chloride (K-DUR) 10 MEQ tablet Take 1 tablet (10 mEq total) by mouth daily. 01/02/17   McGowen, [name], MD    Family History Family History  Problem Relation Age of Onset  . Heart failure Mother   .  Kidney disease Mother        renal failure  . Diabetes Mother   . Diabetes Father   . Diabetes Brother   . Heart disease Brother   . Heart attack Brother   . Heart attack Brother   . Heart attack Brother   . Colon cancer Neg Hx     Social History Social History  Substance Use Topics  . Smoking status: Former Smoker    Packs/day: 1.00    Years: 45.00    Types: Cigarettes    Quit date: 10/24/2008  . Smokeless tobacco: Never Used  . Alcohol use No     Allergies   Lactose intolerance (gi) and Ibuprofen   Review of Systems Review of Systems  Constitutional: Negative for activity change, chills, diaphoresis, fatigue and fever.  HENT: Negative for congestion, rhinorrhea, sinus pain, sore throat and trouble swallowing.   Eyes: Negative for pain and visual disturbance.  Respiratory: Negative for cough, chest tightness, shortness of breath and wheezing.   Cardiovascular: Negative for chest pain, palpitations and leg swelling.  Gastrointestinal: Positive for nausea and vomiting. Negative for abdominal pain and diarrhea.  Genitourinary: Negative for difficulty urinating, dysuria and flank pain.  Musculoskeletal: Negative for arthralgias, back pain and myalgias.  Skin: Negative for color change and rash.  Neurological: Negative for dizziness, syncope, weakness, light-headedness, numbness and headaches.  Hematological: Does not bruise/bleed easily.  Psychiatric/Behavioral: Negative for confusion and decreased concentration.     Physical Exam Updated Vital Signs BP (!) 115/52 (BP Location: Left Arm)   Pulse 70   Temp 97.7 F (36.5 C) (Oral)   Resp 19    Ht 5\' 2"  (1.575 m)   Wt 64.9 kg (143 lb)   SpO2 98%   BMI 26.16 kg/m   Physical Exam  Constitutional: She appears well-developed and well-nourished. No distress.  HENT:  Head: Normocephalic and atraumatic.  Mouth/Throat: Oropharynx is clear and moist.  Eyes: Pupils are equal, round, and reactive to light. Conjunctivae and EOM are normal.  Pupils miotic.   Neck: Normal range of motion. Neck supple.  No cervical spine tenderness.  Cardiovascular: Normal rate, regular rhythm and normal heart sounds.   No murmur heard. Pulmonary/Chest: Effort normal and breath sounds normal. She has no wheezes. She has no rales.  Abdominal: Soft. She exhibits no distension. There is no tenderness.  Musculoskeletal:  Deformity present over right humeral head. No ecchymosis. No tenderness of elbow or right distal radius of RUE. Motion of LUE limited due to pain. Patient has sensation over axillary nerve distribution.   Patient nontender over anterior or posterior hip, greater trochanter, ischial tuberosity. Sacrum nontender. Patient has full active and passive range of motion of bilateral hips.  Patient is neurovascularly intact in bilateral upper extremities.   Lymphadenopathy:    She has no cervical adenopathy.  Neurological: She is alert.  Sensation to light and dull touch fully intact in right upper extremity.  Skin: Skin is warm and dry.  Psychiatric: She has a normal mood and affect. Her behavior is normal. Judgment and thought content normal.     ED Treatments / Results  Labs (all labs ordered are listed, but only abnormal results are displayed) Labs Reviewed - No data to display  EKG  EKG Interpretation None       Radiology Dg Ribs Unilateral W/chest Right  Result Date: 06/10/2017 CLINICAL DATA:  Recent fall with right-sided pain, initial encounter EXAM: RIGHT RIBS AND CHEST - 3+ VIEW COMPARISON:  12/09/2016  FINDINGS: Cardiac shadow is stable. Aortic calcifications are again seen.  Soft tissue density is noted in the right peritracheal region likely predominately related to radiation change. The possibility of recurrent neoplasm could not be totally excluded. No pneumothorax is seen. The comminuted fracture of the proximal right humerus is noted. Some angulation is noted along the inferior aspect of the glenoid which could be related to a focal fracture. No definitive acute rib fracture is noted. IMPRESSION: No evidence of definitive rib fracture. Comminuted right humeral fracture is again seen. Some irregularity of the glenoid is noted which may be related to a fracture. Electronically Signed   By: Inez Catalina M.D.   On: 06/10/2017 18:49   Dg Shoulder Right  Result Date: 06/10/2017 CLINICAL DATA:  Recent fall with right shoulder pain, initial encounter EXAM: RIGHT SHOULDER - 2+ VIEW COMPARISON:  None. FINDINGS: There is a comminuted fracture of the proximal right humerus with some impaction at the fracture site. The surgical neck is involved as well as multiple fracture lines extending into the humeral head. No other bony abnormality is seen. IMPRESSION: Comminuted proximal right humeral fracture. Electronically Signed   By: Inez Catalina M.D.   On: 06/10/2017 18:47   Dg Elbow 2 Views Right  Result Date: 06/10/2017 CLINICAL DATA:  Recent fall with elbow pain, initial encounter EXAM: RIGHT ELBOW - 2 VIEW COMPARISON:  None. FINDINGS: Mild spurring from the olecranon is noted. No acute fracture or dislocation is seen. No joint effusion is noted. IMPRESSION: No acute abnormality seen. Electronically Signed   By: Inez Catalina M.D.   On: 06/10/2017 18:49   Ct Head Wo Contrast  Result Date: 06/10/2017 CLINICAL DATA:  Pain following fall EXAM: CT HEAD WITHOUT CONTRAST CT CERVICAL SPINE WITHOUT CONTRAST TECHNIQUE: Multidetector CT imaging of the head and cervical spine was performed following the standard protocol without intravenous contrast. Multiplanar CT image reconstructions of the  cervical spine were also generated. COMPARISON:  Head CT September 26, 2016 FINDINGS: CT HEAD FINDINGS Brain: There is moderate diffuse atrophy. There is no intracranial mass, hemorrhage, extra-axial fluid collection, or midline shift. There is small vessel disease throughout the centra semiovale bilaterally. Elsewhere gray-white compartments appear normal. No acute infarct evident. Vascular: No hyperdense vessel. There is slight calcification in each carotid siphon. Skull: Bony calvarium appears intact. Sinuses/Orbits: Visualized paranasal sinuses are clear. Frontal sinuses are aplastic. Orbits appear symmetric bilaterally. Other: There is opacification of multiple mastoid air cells on the left, a chronic finding. Mastoid air cells on the right are clear. CT CERVICAL SPINE FINDINGS Alignment: There is lower cervical levoscoliosis. No appreciable spondylolisthesis. Skull base and vertebrae: Skull base and craniocervical junction regions appear normal. There is no evident fracture. There are no blastic or lytic bone lesions. Soft tissues and spinal canal: Prevertebral soft tissues and predental space regions are normal. No paraspinous lesions. No evident cord or canal hematoma. Disc levels: There is moderately severe disc space narrowing at C4-5. Other disc spaces appear unremarkable. There is facet osteoarthritic change at several levels bilaterally. There is exit foraminal narrowing on the right at C3-4 due to bony hypertrophy. There is marked exit foraminal narrowing at C4-5 on the right with impression on the exiting nerve root. There is no disc extrusion or stenosis. Upper chest: There is scarring and pleural thickening in the right apex. Visualized upper lung zones otherwise clear. Other: There are foci of calcification in each carotid artery. IMPRESSION: CT head: Atrophy with periventricular small vessel disease, stable. No  intracranial mass, hemorrhage, or extra-axial fluid collection. No acute infarct evident.  Mild arterial vascular calcification. Chronic left mastoid air cell disease. CT cervical spine: Scoliosis. No fracture or spondylolisthesis. Osteoarthritic changes several levels. Scarring right apex. Carotid artery calcification bilaterally. Electronically Signed   By: Lowella Grip III M.D.   On: 06/10/2017 18:23   Ct Cervical Spine Wo Contrast  Result Date: 06/10/2017 CLINICAL DATA:  Pain following fall EXAM: CT HEAD WITHOUT CONTRAST CT CERVICAL SPINE WITHOUT CONTRAST TECHNIQUE: Multidetector CT imaging of the head and cervical spine was performed following the standard protocol without intravenous contrast. Multiplanar CT image reconstructions of the cervical spine were also generated. COMPARISON:  Head CT September 26, 2016 FINDINGS: CT HEAD FINDINGS Brain: There is moderate diffuse atrophy. There is no intracranial mass, hemorrhage, extra-axial fluid collection, or midline shift. There is small vessel disease throughout the centra semiovale bilaterally. Elsewhere gray-white compartments appear normal. No acute infarct evident. Vascular: No hyperdense vessel. There is slight calcification in each carotid siphon. Skull: Bony calvarium appears intact. Sinuses/Orbits: Visualized paranasal sinuses are clear. Frontal sinuses are aplastic. Orbits appear symmetric bilaterally. Other: There is opacification of multiple mastoid air cells on the left, a chronic finding. Mastoid air cells on the right are clear. CT CERVICAL SPINE FINDINGS Alignment: There is lower cervical levoscoliosis. No appreciable spondylolisthesis. Skull base and vertebrae: Skull base and craniocervical junction regions appear normal. There is no evident fracture. There are no blastic or lytic bone lesions. Soft tissues and spinal canal: Prevertebral soft tissues and predental space regions are normal. No paraspinous lesions. No evident cord or canal hematoma. Disc levels: There is moderately severe disc space narrowing at C4-5. Other disc  spaces appear unremarkable. There is facet osteoarthritic change at several levels bilaterally. There is exit foraminal narrowing on the right at C3-4 due to bony hypertrophy. There is marked exit foraminal narrowing at C4-5 on the right with impression on the exiting nerve root. There is no disc extrusion or stenosis. Upper chest: There is scarring and pleural thickening in the right apex. Visualized upper lung zones otherwise clear. Other: There are foci of calcification in each carotid artery. IMPRESSION: CT head: Atrophy with periventricular small vessel disease, stable. No intracranial mass, hemorrhage, or extra-axial fluid collection. No acute infarct evident. Mild arterial vascular calcification. Chronic left mastoid air cell disease. CT cervical spine: Scoliosis. No fracture or spondylolisthesis. Osteoarthritic changes several levels. Scarring right apex. Carotid artery calcification bilaterally. Electronically Signed   By: Lowella Grip III M.D.   On: 06/10/2017 18:23    Procedures Procedures (including critical care time)  Medications Ordered in ED Medications  morphine 4 MG/ML injection 4 mg (4 mg Intravenous Given 06/10/17 1901)  Tdap (BOOSTRIX) injection 0.5 mL (0.5 mLs Intramuscular Given 06/10/17 2022)  ondansetron (ZOFRAN-ODT) disintegrating tablet 4 mg (4 mg Oral Given 06/10/17 2140)     Initial Impression / Assessment and Plan / ED Course  I have reviewed the triage vital signs and the nursing notes.  Pertinent labs & imaging results that were available during my care of the patient were reviewed by me and considered in my medical decision making (see chart for details).  Clinical Course as of Jun 11 217  Sat Jun 10, 2017  1908 DG Ribs Unilateral W/Chest Right [AM]  1918 DG Shoulder Right [AM]    Clinical Course User Index [AM] Albesa Seen, PA-C   This is a shared visit with Dr. Noemi Chapel, MD.  [phone]. Patient reevaluated. Patient reports  that her pain is  currently controlled after 4 mg of morphine.  Final Clinical Impressions(s) / ED Diagnoses   Final diagnoses:  Other closed displaced fracture of proximal end of right humerus, initial encounter   MDM  Pt is a 71 y.o. Female with a past history of diabetes, cardiovascular disease, and cardiac arrest in November 2017 presenting for a fall just prior to arrival. Patient's history is consistent with a mechanical fall and on history or exam there is no evidence of syncope or presyncope preceding this fall. All areas potentially affected by this mechanical fall were evaluated in today's encounter including right shoulder, ribs, right elbow, C-spine, and CT scan of the head to rule out intracranial hemorrhage due to antiplatelets. Patient found to have a comminuted fracture of the proximal right humerus with impaction as well as some irregularity at the glenoid on the right. No evidence of fracture in any other of the areas imaged. Patient's hips were examined for pain and injury and found to have no involvement in the fall.   Patient wishes to follow up with an orthopedist in [place]. Consult to Dr. Arther Abbott regarding management of patient placed by Dr. Noemi Chapel. Patient discharged with sling immobilizer as well as pain medication. Due to no preceding syncopal or presyncopal event it is unlikely that this fall was due to any neurocardiogenic or cardiac cause and patient is stable for discharge with close monitoring by family.   X-ray of ribs also noted an area peritracheal density likely secondary to patient's radiation treatments from lung cancer however it is suggested that she follow-up with her primary care physician to ensure proper workup on this. Patient was informed of this finding and instructed to follow up with PCP regarding regular cancer surveillance.  New Prescriptions Discharge Medication List as of 06/10/2017  8:55 PM    START taking these medications   Details    HYDROcodone-acetaminophen (NORCO/VICODIN) 5-325 MG tablet Take 2 tablets by mouth every 4 (four) hours as needed., Starting Sat 06/10/2017, Print         [place], Quinton B, PA-C 06/11/17 9323    Noemi Chapel, MD 06/11/17 [phone]

## 2017-06-10 NOTE — Discharge Instructions (Addendum)
Please use Tylenol for pain, Vicodin for severe pain, keep your arm in the sling, ice and elevation.  Call Dr. Aline Brochure on Monday for appointment Please also make an appointment with your primary care doctor Dr. Anitra Lauth and have him obtain all imaging results from today to review with you.

## 2017-06-10 NOTE — Progress Notes (Signed)
Patient ID: [name], female   DOB: 1945/12/02, 71 y.o.   MRN: [id number] ntd

## 2017-06-10 NOTE — ED Triage Notes (Addendum)
Fall when getting out of car today.  C/o pain to left shoulder area.  CBG 153. Pt denies any other complaints.  EMS gave 75  mcg Fentanyl with some relief.

## 2017-06-10 NOTE — ED Provider Notes (Signed)
   The patient is a 71 year old female who presents after having a trip and fall getting out of the car, fell onto her right shoulder, has associated swelling. On exam she has a swollen right shoulder with decreased range of motion normal pulses at the wrist and normal sensation as well. X-rays show a comminuted fracture of the right humeral head which does appear to be located appropriately. I will discuss this with orthopedics, the patient will be placed in a sling, NSAIDs, ice. The patient and family members were informed of these findings.  Also has possible glenoid irregularity  I discussed care with Dr. Aline Brochure of the orthopedic service, he is happy to see the patient and follow-up. The patient was informed of these results and close follow-up with Dr. Aline Brochure, she is in agreement. Family was there and states there is a good support system at home. They state they may want to follow up in [place] at an [facility] who has treated another family member  Medical screening examination/treatment/procedure(s) were conducted as a shared visit with non-physician practitioner(s) and myself.  I personally evaluated the patient during the encounter.  Clinical Impression:   Final diagnoses:  Other closed displaced fracture of proximal end of right humerus, initial encounter         Noemi Chapel, MD 06/11/17 1501

## 2017-06-12 ENCOUNTER — Telehealth: Payer: Self-pay | Admitting: Orthopedic Surgery

## 2017-06-12 DIAGNOSIS — S42294A Other nondisplaced fracture of upper end of right humerus, initial encounter for closed fracture: Secondary | ICD-10-CM | POA: Diagnosis not present

## 2017-06-12 NOTE — Telephone Encounter (Signed)
Called patient to confirm appointment for this afternoon, per Dr Aline Brochure and [facility]; patient relays she has already scheduled appointment for today with Dr Onnie Graham in [place]. States she was "walking out the door." Notified Dr Aline Brochure.Marland Kitchen

## 2017-06-12 NOTE — Telephone Encounter (Signed)
-----   Message from Carole Civil, MD sent at 06/10/2017  7:57 PM EDT ----- Another one for mon after noon   150 pm

## 2017-06-14 ENCOUNTER — Ambulatory Visit (INDEPENDENT_AMBULATORY_CARE_PROVIDER_SITE_OTHER): Payer: Medicare Other | Admitting: Family Medicine

## 2017-06-14 ENCOUNTER — Encounter: Payer: Self-pay | Admitting: Family Medicine

## 2017-06-14 VITALS — BP 107/67 | HR 108 | Temp 98.0°F | Resp 16 | Ht 62.0 in | Wt 146.8 lb

## 2017-06-14 DIAGNOSIS — S42411D Displaced simple supracondylar fracture without intercondylar fracture of right humerus, subsequent encounter for fracture with routine healing: Secondary | ICD-10-CM | POA: Diagnosis not present

## 2017-06-14 DIAGNOSIS — M79601 Pain in right arm: Secondary | ICD-10-CM | POA: Diagnosis not present

## 2017-06-14 DIAGNOSIS — Z79899 Other long term (current) drug therapy: Secondary | ICD-10-CM

## 2017-06-14 NOTE — Progress Notes (Signed)
06/14/2017  CC:  Chief Complaint  Patient presents with  . ER follow up    fell and hurt her shoulder    Patient is a 71 y.o. Caucasian female who presents for emergency dept follow up. Dates seen in ED: 06/10/17. Days since d/c from ED: 4 Patient was discharged from ED to home. Reason for ED visit: fall, slipped on wet pavement/tripped over curb, fell onto R shoulder and R hip.  Did not hit head, no presyncope/syncope, no LOC. All areas potentially affected by this mechanical fall were evaluated in the ED encounter including right shoulder, ribs, right elbow, C-spine, and CT scan of the head to rule out intracranial hemorrhage due to antiplatelets. Patient found to have a comminuted fracture of the proximal right humerus with impaction as well as some irregularity at the glenoid on the right. No evidence of fracture in any other of the areas imaged. Patient's hips were examined for pain and injury and found to have no involvement in the fall.  She was instructed to see ortho, was put in arm sling, and rx'd vicodin 5/325. She has already seen MD with [place] [facility], Dr. Stann Mainland , f/u set for Sept 5.  He has already made preliminary recommendations based on x-rays: no surgery recommended.  Continue wearing splint.  Dr. Stann Mainland gave her #30 more vicodin.  When she takes 2 this helps.  She sleeps a lot with the pain med use.  +Constipation.   She declines prune juice.   Incidentally: X-ray of ribs also noted an area peritracheal density likely secondary to patient's radiation treatments from lung cancer however it is suggested that she follow-up with her primary care physician to ensure proper workup on this.  In the ED, a noncontrast C spine CT stated this was scarring in R lung apex.  I have reviewed patient's discharge summary plus pertinent specific notes, labs, and imaging from the hospitalization.      Medication reconciliation was done today and patient is taking meds as recommended by  discharging hospitalist/specialist.    PMH:  Past Medical History:  Diagnosis Date  . Age-related nuclear cataract of both eyes 2016   +cortical age related cataracts OU   . Allergy    seasonal  . Arthritis    hands  . Bilateral diabetic retinopathy (HCC) 2015   Dr. Zigmund Daniel  . Blood transfusion without reported diagnosis    when taking chemo  . Chronic combined systolic and diastolic congestive heart failure (HCC)    Takotsubo CM (resolved @5 /2018):  a. 08/2016 Echo: EF 30-35%, gr1DD, PASP 34mmHg;  b. 11/2016 Echo: EF 40-45%, no rwma, nl RV fxn, mild TR, PASP 10mmHg.  . CKD (chronic kidney disease), stage II    GFR 60s  . 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  Marland Kitchen COPD (chronic obstructive pulmonary disease) ([place])   . CVA (cerebral vascular accident) ([place]) 09/2016   MRI did show a left-sided small ischemic stroke which was acute but likely incidental (MRI was done b/c pt had TIA sx's in hospital).  Neuro put her on plavix at that time.  . Diabetes mellitus with complication (HCC)    diab retinopathy OU (laser)  . Diverticulitis 05/2016  . GERD (gastroesophageal reflux disease)    protonix  . History of diverticulitis of colon    with abscess; required IR percutaneous drain placed 09/2016.  Marland Kitchen Hyperlipidemia   . Hypertension   . Left bundle branch block (LBBB) 12/16/2016  . Lung  cancer ([place])    non-small cell lung ca, stage III in 05/2011; systemic chemotherapy concurrent with radiation followed by prophylactic cranial irradiation and has been observation since July of 2010 with no evidence for disease recurrence-released from onc f/u 12/2014 (needs annual cxr by PCP).  CXR 08/2015 stable.  . Mild cognitive impairment with memory loss    Likely from brain radiation therapy  . Non-obstructive CAD (coronary artery disease)    a. Cath 2006 preserved LV fxn, scattered irregularities without critical stenosis; b. 2008 stress echo negative for  ischemia, but with hypertensive response; c. 08/2016 Cath: D1 25%, otw nl.  . Normocytic anemia 2018   Iron, vit B12, folate all normal 12/2016.  Marland Kitchen Open-angle glaucoma 2016   Dr. Shirley Muscat, (bilateral)---responding to topical therapy  . Osteopenia 03/2016   03/2016 DEXA T-score -2.2  . Pneumonia    hospitalization 11/2016; hypoxemic resp failure--d/c'd home with home oxygen therapy  . Subclinical hyperthyroidism 2018  . Takotsubo cardiomyopathy    a. 08/2016 Echo: EF 30-35%, gr1DD, PASP 64mmHg;  b. 08/2016 Cath: nonobs Dzs;  c. 11/2016 Echo: EF 40-45%, no rwma, nl RV fxn, mild TR, PASP 58mmHg.  ECHO 02/2017 EF 50-55%, grd II DD---essentially resolved Takotsubo 02/2017.  . Torsades de pointes ([place]) 08/2016   a. 08/2016 in setting of diverticulitis & pneumoperitoneum and Takotsubo CM -->prolonged QT, seen by EP-->avoid meds with potential for QT prolongation.    PSH:  Past Surgical History:  Procedure Laterality Date  . ABDOMINAL HYSTERECTOMY  1997  . APPENDECTOMY    . CARDIAC CATHETERIZATION N/A 09/14/2016   Procedure: Left Heart Cath and Coronary Angiography;  Surgeon: Troy Sine, MD;  Location: [facility];  Service: Cardiovascular; Mild nonobstructive CAD with 85% smooth narrowing in the first diagonal branch of the LAD; 10% smooth narrowing of the ostial proximal left circumflex coronary artery; and a normal dominant RCA.  Marland Kitchen CARPAL TUNNEL RELEASE    . CATARACT EXTRACTION    . CHOLECYSTECTOMY  2002  . COLONOSCOPY  10/24/00   normal.  [employer] hemoccult testing via [place] 06/18/15 was NEG  . IR GENERIC HISTORICAL  09/19/2016   IR SINUS/FIST TUBE CHK-NON GI 09/19/2016 Corrie Mckusick, DO [facility]  . IR GENERIC HISTORICAL  10/06/2016   IR RADIOLOGIST EVAL & MGMT 10/06/2016 [facility]  . IR GENERIC HISTORICAL  10/26/2016   IR RADIOLOGIST EVAL & MGMT 10/26/2016 [facility]  . IR GENERIC HISTORICAL  11/03/2016   IR RADIOLOGIST EVAL & MGMT 11/03/2016 Ardis Rowan, PA-C  [facility]  . IR GENERIC HISTORICAL  11/24/2016   IR RADIOLOGIST EVAL & MGMT 11/24/2016 Ardis Rowan, PA-C [facility]  . IR GENERIC HISTORICAL  12/05/2016   Fistula smaller/improving.  IR SINUS/FIST TUBE CHK-NON GI 12/05/2016 Sandi Mariscal, MD [facility]  . IR GENERIC HISTORICAL  12/22/2016   IR RADIOLOGIST EVAL & MGMT 12/22/2016 Greggory Keen, MD [facility]  . IR RADIOLOGIST EVAL & MGMT  01/10/2017  . IR RADIOLOGIST EVAL & MGMT  02/09/2017  . IR SINUS/FIST TUBE CHK-NON GI  03/28/2017  . LEFT HEART CATHETERIZATION WITH CORONARY ANGIOGRAM N/A 05/30/2012   Procedure: LEFT HEART CATHETERIZATION WITH CORONARY ANGIOGRAM;  Surgeon: Burnell Blanks, MD;  Location[facility];  Service: Cardiovascular: Mild, non-obstructive CAD  . TRANSTHORACIC ECHOCARDIOGRAM  09/14/2016; 11/2016; 02/2017   08/2016: EF 30-35 %, Akinesis of the mid-apicalanteroseptal myocardium.  Grade I DD.  Mild pulm HTN.  Repeat echo  11/2016, EF 40-45%, no rwma, nl RV fxn, mild TR, PASP 76mmHg.  02/2017 EF 50-55%, grade II DD    MEDS:  Outpatient Medications Prior to Visit  Medication Sig Dispense Refill  . acetaminophen (TYLENOL) 500 MG tablet Take 500-1,000 mg by mouth every 6 (six) hours as needed for headache (or pain).     Marland Kitchen albuterol (PROAIR HFA) 108 (90 Base) MCG/ACT inhaler Inhale 2 puffs into the lungs every 6 (six) hours as needed for wheezing or shortness of breath. 18 g 3  . albuterol (PROVENTIL) (2.5 MG/3ML) 0.083% nebulizer solution USE ONE VIAL IN NEBULIZER EVERY 4 HOURS AS NEEDED FOR WHEEZING OR SHORTNESS OF BREATH 75 mL 3  . aspirin 81 MG chewable tablet Chew 81 mg by mouth daily.    Marland Kitchen atorvastatin (LIPITOR) 80 MG tablet Take 1 tablet (80 mg total) by mouth daily. 90 tablet 1  . cetirizine (ZYRTEC) 10 MG tablet Take 10 mg by mouth daily.    . clopidogrel (PLAVIX) 75 MG tablet Take 1 tablet (75 mg total) by mouth daily. 30 tablet 6  . escitalopram (LEXAPRO) 5 MG tablet Take 1 tablet (5 mg total) by  mouth daily. 90 tablet 0  . fluticasone (FLOVENT HFA) 220 MCG/ACT inhaler Inhale 2 puffs into the lungs 2 (two) times daily. 12 g 6  . furosemide (LASIX) 40 MG tablet Take 0.5 tablets (20 mg total) by mouth daily. 30 tablet 6  . glucose blood test strip Use to check blood sugar three times daily 100 each 12  . HUMULIN 70/30 KWIKPEN (70-30) 100 UNIT/ML PEN Per sliding scale, takes this qAM and aPM:  140-199 - 2 units, 200-250 - 4 units, 251-299 - 6 units,  300-349 - 8 units,  350 or above 10 units. Dispense syringes and needles as needed, Ok to switch to PEN if approved. Substitute to any brand approved. DX DM2, Code E11.65 15 mL 6  . HYDROcodone-acetaminophen (NORCO/VICODIN) 5-325 MG tablet Take 2 tablets by mouth every 4 (four) hours as needed. 10 tablet 0  . metoprolol tartrate (LOPRESSOR) 25 MG tablet TAKE 25 MG ( 1 TABLET)  IN THE MORNING , TAKE 50 MG ( 2 TABLETS) AT BEDTIME 270 tablet 3  . nitroGLYCERIN (NITROSTAT) 0.4 MG SL tablet Place 1 tablet (0.4 mg total) under the tongue every 5 (five) minutes as needed. For chest pain 50 tablet 3  . ONE TOUCH ULTRA TEST test strip USE  STRIP TO CHECK GLUCOSE THREE TIMES DAILY 100 each 11  . ONETOUCH DELICA LANCETS 79K MISC TEST 3 TIMES A DAY 100 each 11  . pantoprazole (PROTONIX) 40 MG tablet Take 1 tablet (40 mg total) by mouth daily. 30 tablet 6  . potassium chloride (K-DUR) 10 MEQ tablet Take 1 tablet (10 mEq total) by mouth daily. 30 tablet 6   No facility-administered medications prior to visit.    EXAM: BP 107/67 (BP Location: Left Arm, Patient Position: Sitting, Cuff Size: Normal)   Pulse (!) 108   Temp 98 F (36.7 C) (Oral)   Resp 16   Ht 5\' 2"  (1.575 m)   Wt 146 lb 12 oz (66.6 kg)   SpO2 98%   BMI 26.84 kg/m  Gen: Alert, mildly drowsy-appearing.  Patient is oriented to person, place, time, and situation. CV: Regular, tachy to 110, no m/r/g Chest is clear, no wheezing or rales. Normal symmetric air entry throughout both lung fields. No  chest wall deformities or tenderness. EXT: no clubbing, cyanosis, or edema.  R arm in sling, bruising and TTP along R shoulder and upper arm and R chest wall and R breast.   Pertinent labs/imaging Lab Results  Component Value Date   WBC 7.0 04/20/2017   HGB 11.0 (L) 04/20/2017   HCT 33.8 (L) 04/20/2017   MCV 81.9 04/20/2017   PLT 285.0 04/20/2017     Chemistry      Component Value Date/Time   NA 145 04/20/2017 0848   NA 141 01/15/2015 0848   K 4.4 04/20/2017 0848   K 4.0 01/15/2015 0848   CL 107 04/20/2017 0848   CL 105 02/18/2013 0812   CO2 29 04/20/2017 0848   CO2 25 01/15/2015 0848   BUN 13 04/20/2017 0848   BUN 9.0 01/15/2015 0848   CREATININE 0.93 04/20/2017 0848   CREATININE 0.7 01/15/2015 0848      Component Value Date/Time   CALCIUM 9.2 04/20/2017 0848   CALCIUM 9.2 01/15/2015 0848   ALKPHOS 99 04/20/2017 0848   ALKPHOS 92 01/15/2015 0848   AST 14 04/20/2017 0848   AST 19 01/15/2015 0848   ALT 10 04/20/2017 0848   ALT 20 01/15/2015 0848   BILITOT 0.3 04/20/2017 0848   BILITOT 0.34 01/15/2015 0848      ASSESSMENT/PLAN:  Right humerus fracture:  Per ortho, continuing splint, surgery not necessary, pain control with vicodin 5/325. Discussed potential adverse side effects of pain meds, esp sedation and constipation. Pt and husband expressed understanding. Incentive spirometery, 1 strong inhalation tid. Take miralax 1 capful 1 capful qd - bid.  Signs/symptoms to call or return for were reviewed and pt expressed understanding.  Spent 25 min with pt today, with >50% of this time spent in counseling and care coordination regarding the above problems.  An After Visit Summary was printed and given to the patient.  FOLLOW UP:  4 weeks f/u fracture/pain  Signed:  Crissie Sickles, MD           06/14/2017

## 2017-06-14 NOTE — Patient Instructions (Signed)
Take 1 capful of miralax powder with 8 oz of water one to two times per day to help keep bowels moving.

## 2017-06-19 ENCOUNTER — Telehealth: Payer: Self-pay | Admitting: *Deleted

## 2017-06-19 NOTE — Telephone Encounter (Signed)
Pts husband called and stated that pts nebulizer machine has died. He stated that he has tried to clean the filter but when he cuts it on it doesn't do anything. He is requesting a Rx for a new nebulizer. Please advise. Thanks.

## 2017-06-19 NOTE — Telephone Encounter (Signed)
Rx put up front for p/u along with list of medical supply stores. Pts daughter will come by to p/u Rx.

## 2017-06-19 NOTE — Telephone Encounter (Signed)
OK, rx for nebulizer + supplies written.

## 2017-06-26 ENCOUNTER — Other Ambulatory Visit: Payer: Self-pay | Admitting: Family Medicine

## 2017-06-27 NOTE — Telephone Encounter (Signed)
[facility] [address]

## 2017-06-28 DIAGNOSIS — S42291D Other displaced fracture of upper end of right humerus, subsequent encounter for fracture with routine healing: Secondary | ICD-10-CM | POA: Diagnosis not present

## 2017-07-11 ENCOUNTER — Ambulatory Visit (INDEPENDENT_AMBULATORY_CARE_PROVIDER_SITE_OTHER): Payer: Medicare Other | Admitting: Family Medicine

## 2017-07-11 ENCOUNTER — Encounter: Payer: Self-pay | Admitting: Family Medicine

## 2017-07-11 VITALS — BP 124/76 | HR 98 | Temp 97.4°F | Resp 18 | Wt 142.0 lb

## 2017-07-11 DIAGNOSIS — R040 Epistaxis: Secondary | ICD-10-CM

## 2017-07-11 DIAGNOSIS — M79601 Pain in right arm: Secondary | ICD-10-CM | POA: Diagnosis not present

## 2017-07-11 DIAGNOSIS — S42411D Displaced simple supracondylar fracture without intercondylar fracture of right humerus, subsequent encounter for fracture with routine healing: Secondary | ICD-10-CM | POA: Diagnosis not present

## 2017-07-11 NOTE — Progress Notes (Signed)
OFFICE VISIT  07/11/2017  CC:  Chief Complaint  Patient presents with  . Arm Injury    follow up   HPI:    Patient is a 71 y.o. Caucasian female who presents for 4 week f/u right arm fracture. On 06/10/17 she sustained a comminuted fracture of the proximal right humerus with impaction as well as some irregularity at the glenoid on the right. She is followed by Dr. Stann Mainland in orthopedics, initial recommendations were NO SURGERY.  Immobilization and treatment of pain with vicodin. Last visit she was tolerating vicodin and it was helping sufficiently with pain.  We discussed possible complication of constipation as well as the need to do incentive spirometry to keep from getting atelectasis.  Currently pain is 8/10 intensity in R arm, has not taken vicodin today.  Has only been taking 1 at a time b/c she was only dispensed enough to take it like this.  Has orthopedist f/u tomorrow---Dr. Stann Mainland ([facility]). No constipation.   She is taking sling off some at home and doing gentle ROM exercises.  Wt has been stable around 140 lbs. Glucoses around 150 avg but she only has 3 numbers to show me today.  Had 3 nosebleeds since last night, left nostril.  Not using oxygen at home, denies nasal dryness, itching, or pain.  Past Medical History:  Diagnosis Date  . Age-related nuclear cataract of both eyes 2016   +cortical age related cataracts OU   . Allergy    seasonal  . Arthritis    hands  . Bilateral diabetic retinopathy (HCC) 2015   Dr. Zigmund Daniel  . Blood transfusion without reported diagnosis    when taking chemo  . Chronic combined systolic and diastolic congestive heart failure (HCC)    Takotsubo CM (resolved @5 /2018):  a. 08/2016 Echo: EF 30-35%, gr1DD, PASP 50mmHg;  b. 11/2016 Echo: EF 40-45%, no rwma, nl RV fxn, mild TR, PASP 5mmHg.  . CKD (chronic kidney disease), stage II    GFR 60s  . 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  Marland Kitchen COPD (chronic  obstructive pulmonary disease) ([place])   . CVA (cerebral vascular accident) ([place]) 09/2016   MRI did show a left-sided small ischemic stroke which was acute but likely incidental (MRI was done b/c pt had TIA sx's in hospital).  Neuro put her on plavix at that time.  . Diabetes mellitus with complication (HCC)    diab retinopathy OU (laser)  . Diverticulitis 05/2016  . GERD (gastroesophageal reflux disease)    protonix  . History of diverticulitis of colon    with abscess; required IR percutaneous drain placed 09/2016.  Marland Kitchen Hyperlipidemia   . Hypertension   . Left bundle branch block (LBBB) 12/16/2016  . Lung cancer ([place])    non-small cell lung ca, stage III in 05/2011; systemic chemotherapy concurrent with radiation followed by prophylactic cranial irradiation and has been observation since July of 2010 with no evidence for disease recurrence-released from onc f/u 12/2014 (needs annual cxr by PCP).  CXR 08/2015 stable.  . Mild cognitive impairment with memory loss    Likely from brain radiation therapy  . Non-obstructive CAD (coronary artery disease)    a. Cath 2006 preserved LV fxn, scattered irregularities without critical stenosis; b. 2008 stress echo negative for ischemia, but with hypertensive response; c. 08/2016 Cath: D1 25%, otw nl.  . Normocytic anemia 2018   Iron, vit B12, folate all normal 12/2016.  Marland Kitchen Open-angle glaucoma 2016  Dr. Shirley Muscat, (bilateral)---responding to topical therapy  . Osteopenia 03/2016   03/2016 DEXA T-score -2.2  . Pneumonia    hospitalization 11/2016; hypoxemic resp failure--d/c'd home with home oxygen therapy  . Subclinical hyperthyroidism 2018  . Takotsubo cardiomyopathy    a. 08/2016 Echo: EF 30-35%, gr1DD, PASP 24mmHg;  b. 08/2016 Cath: nonobs Dzs;  c. 11/2016 Echo: EF 40-45%, no rwma, nl RV fxn, mild TR, PASP 71mmHg.  ECHO 02/2017 EF 50-55%, grd II DD---essentially resolved Takotsubo 02/2017.  . 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 08/2016 in setting of  diverticulitis & pneumoperitoneum and Takotsubo CM -->prolonged QT, seen by EP-->avoid meds with potential for QT prolongation.    Past Surgical History:  Procedure Laterality Date  . ABDOMINAL HYSTERECTOMY  1997  . APPENDECTOMY    . CARDIAC CATHETERIZATION N/A 09/14/2016   Procedure: Left Heart Cath and Coronary Angiography;  Surgeon: Troy Sine, MD;  Location: [facility];  Service: Cardiovascular; Mild nonobstructive CAD with 85% smooth narrowing in the first diagonal branch of the LAD; 10% smooth narrowing of the ostial proximal left circumflex coronary artery; and a normal dominant RCA.  Marland Kitchen CARPAL TUNNEL RELEASE    . CATARACT EXTRACTION    . CHOLECYSTECTOMY  2002  . COLONOSCOPY  10/24/00   normal.  [employer] hemoccult testing via [place] 06/18/15 was NEG  . IR GENERIC HISTORICAL  09/19/2016   IR SINUS/FIST TUBE CHK-NON GI 09/19/2016 Corrie Mckusick, DO [facility]  . IR GENERIC HISTORICAL  10/06/2016   IR RADIOLOGIST EVAL & MGMT 10/06/2016 [facility]  . IR GENERIC HISTORICAL  10/26/2016   IR RADIOLOGIST EVAL & MGMT 10/26/2016 [facility]  . IR GENERIC HISTORICAL  11/03/2016   IR RADIOLOGIST EVAL & MGMT 11/03/2016 Ardis Rowan, PA-C [facility]  . IR GENERIC HISTORICAL  11/24/2016   IR RADIOLOGIST EVAL & MGMT 11/24/2016 Ardis Rowan, PA-C [facility]  . IR GENERIC HISTORICAL  12/05/2016   Fistula smaller/improving.  IR SINUS/FIST TUBE CHK-NON GI 12/05/2016 Sandi Mariscal, MD [facility]  . IR GENERIC HISTORICAL  12/22/2016   IR RADIOLOGIST EVAL & MGMT 12/22/2016 Greggory Keen, MD [facility]  . IR RADIOLOGIST EVAL & MGMT  01/10/2017  . IR RADIOLOGIST EVAL & MGMT  02/09/2017  . IR SINUS/FIST TUBE CHK-NON GI  03/28/2017  . LEFT HEART CATHETERIZATION WITH CORONARY ANGIOGRAM N/A 05/30/2012   Procedure: LEFT HEART CATHETERIZATION WITH CORONARY ANGIOGRAM;  Surgeon: Burnell Blanks, MD;  Location: [facility];  Service: Cardiovascular: Mild,  non-obstructive CAD  . TRANSTHORACIC ECHOCARDIOGRAM  09/14/2016; 11/2016; 02/2017   08/2016: EF 30-35 %, Akinesis of the mid-apicalanteroseptal myocardium.  Grade I DD.  Mild pulm HTN.  Repeat echo 11/2016, EF 40-45%, no rwma, nl RV fxn, mild TR, PASP 45mmHg.  02/2017 EF 50-55%, grade II DD    Outpatient Medications Prior to Visit  Medication Sig Dispense Refill  . acetaminophen (TYLENOL) 500 MG tablet Take 500-1,000 mg by mouth every 6 (six) hours as needed for headache (or pain).     Marland Kitchen albuterol (PROAIR HFA) 108 (90 Base) MCG/ACT inhaler Inhale 2 puffs into the lungs every 6 (six) hours as needed for wheezing or shortness of breath. 18 g 3  . albuterol (PROVENTIL) (2.5 MG/3ML) 0.083% nebulizer solution USE ONE VIAL IN NEBULIZER EVERY 4 HOURS AS NEEDED FOR WHEEZING OR SHORTNESS OF BREATH 75 mL 3  . aspirin 81 MG chewable tablet Chew 81 mg by mouth  daily.    . atorvastatin (LIPITOR) 80 MG tablet Take 1 tablet (80 mg total) by mouth daily. 90 tablet 1  . cetirizine (ZYRTEC) 10 MG tablet Take 10 mg by mouth daily.    . clopidogrel (PLAVIX) 75 MG tablet TAKE ONE TABLET BY MOUTH ONCE DAILY 30 tablet 6  . escitalopram (LEXAPRO) 5 MG tablet Take 1 tablet (5 mg total) by mouth daily. 90 tablet 0  . fluticasone (FLOVENT HFA) 220 MCG/ACT inhaler Inhale 2 puffs into the lungs 2 (two) times daily. 12 g 6  . furosemide (LASIX) 40 MG tablet Take 0.5 tablets (20 mg total) by mouth daily. 30 tablet 6  . glucose blood test strip Use to check blood sugar three times daily 100 each 12  . HUMULIN 70/30 KWIKPEN (70-30) 100 UNIT/ML PEN Per sliding scale, takes this qAM and aPM:  140-199 - 2 units, 200-250 - 4 units, 251-299 - 6 units,  300-349 - 8 units,  350 or above 10 units. Dispense syringes and needles as needed, Ok to switch to PEN if approved. Substitute to any brand approved. DX DM2, Code E11.65 15 mL 6  . HYDROcodone-acetaminophen (NORCO/VICODIN) 5-325 MG tablet Take 2 tablets by mouth every 4 (four) hours as  needed. 10 tablet 0  . metoprolol tartrate (LOPRESSOR) 25 MG tablet TAKE 25 MG ( 1 TABLET)  IN THE MORNING , TAKE 50 MG ( 2 TABLETS) AT BEDTIME 270 tablet 3  . nitroGLYCERIN (NITROSTAT) 0.4 MG SL tablet Place 1 tablet (0.4 mg total) under the tongue every 5 (five) minutes as needed. For chest pain 50 tablet 3  . ONE TOUCH ULTRA TEST test strip USE  STRIP TO CHECK GLUCOSE THREE TIMES DAILY 100 each 11  . ONETOUCH DELICA LANCETS 53G MISC TEST 3 TIMES A DAY 100 each 11  . pantoprazole (PROTONIX) 40 MG tablet Take 1 tablet (40 mg total) by mouth daily. 30 tablet 6  . potassium chloride (K-DUR) 10 MEQ tablet Take 1 tablet (10 mEq total) by mouth daily. 30 tablet 6   No facility-administered medications prior to visit.     Allergies  Allergen Reactions  . Lactose Intolerance (Gi) Diarrhea  . Ibuprofen Other (See Comments)    REACTION: Anxiousness, hyperventilates    ROS As per HPI  PE: Blood pressure 124/76, pulse 98, temperature (!) 97.4 F (36.3 C), temperature source Oral, resp. rate 18, weight 142 lb (64.4 kg), SpO2 95 %. Gen: Alert, well appearing.  Patient is oriented to person, place, time, and situation. AFFECT: pleasant, lucid thought and speech. Nose: dried blood at distal aspect of nare on left--floor surface.  No active bleeding. Right arm: diffuse ecchymoses R upper arm flexor surface, with signif TTP at a mild bony deformity on proximal aspect of humerus anteriorly.  She can flex/extend at the elbow, but is unable to move otherwise b/c of pain.  LABS:    Chemistry      Component Value Date/Time   NA 145 04/20/2017 0848   NA 141 01/15/2015 0848   K 4.4 04/20/2017 0848   K 4.0 01/15/2015 0848   CL 107 04/20/2017 0848   CL 105 02/18/2013 0812   CO2 29 04/20/2017 0848   CO2 25 01/15/2015 0848   BUN 13 04/20/2017 0848   BUN 9.0 01/15/2015 0848   CREATININE 0.93 04/20/2017 0848   CREATININE 0.7 01/15/2015 0848      Component Value Date/Time   CALCIUM 9.2 04/20/2017  0848   CALCIUM 9.2 01/15/2015 0848  ALKPHOS 99 04/20/2017 0848   ALKPHOS 92 01/15/2015 0848   AST 14 04/20/2017 0848   AST 19 01/15/2015 0848   ALT 10 04/20/2017 0848   ALT 20 01/15/2015 0848   BILITOT 0.3 04/20/2017 0848   BILITOT 0.34 01/15/2015 0848     Lab Results  Component Value Date   HGBA1C 7.6 (H) 04/20/2017    IMPRESSION AND PLAN:  1) Right prox humerus fracture: still with pretty significant pain intermittently. Has ortho f/u tomorrow.  She declined pain med today, preferring to keep prescribing of this med under the responsibility of one provider (ortho) to avoid confusion of multiple pain med rx's/providers.  2) Epistaxis: mild superficial abrasion L nare floor--distal aspect. No active bleeding.  Advised application of vaseline to the area tid and avoid rubbing/picking/touching nose. An After Visit Summary was printed and given to the patient.  FOLLOW UP: Return for f/u 3-4 wks routine chronic illness (30 min).  Signed:  Crissie Sickles, MD           07/11/2017

## 2017-07-12 DIAGNOSIS — S42291D Other displaced fracture of upper end of right humerus, subsequent encounter for fracture with routine healing: Secondary | ICD-10-CM | POA: Diagnosis not present

## 2017-07-16 IMAGING — DX DG CHEST 2V
2 series · 2 of 2 positions shown · non-contrast
Comparison: 09/07/2016

CLINICAL DATA: Preop

EXAM:
CHEST  2 VIEW

[chest lat]
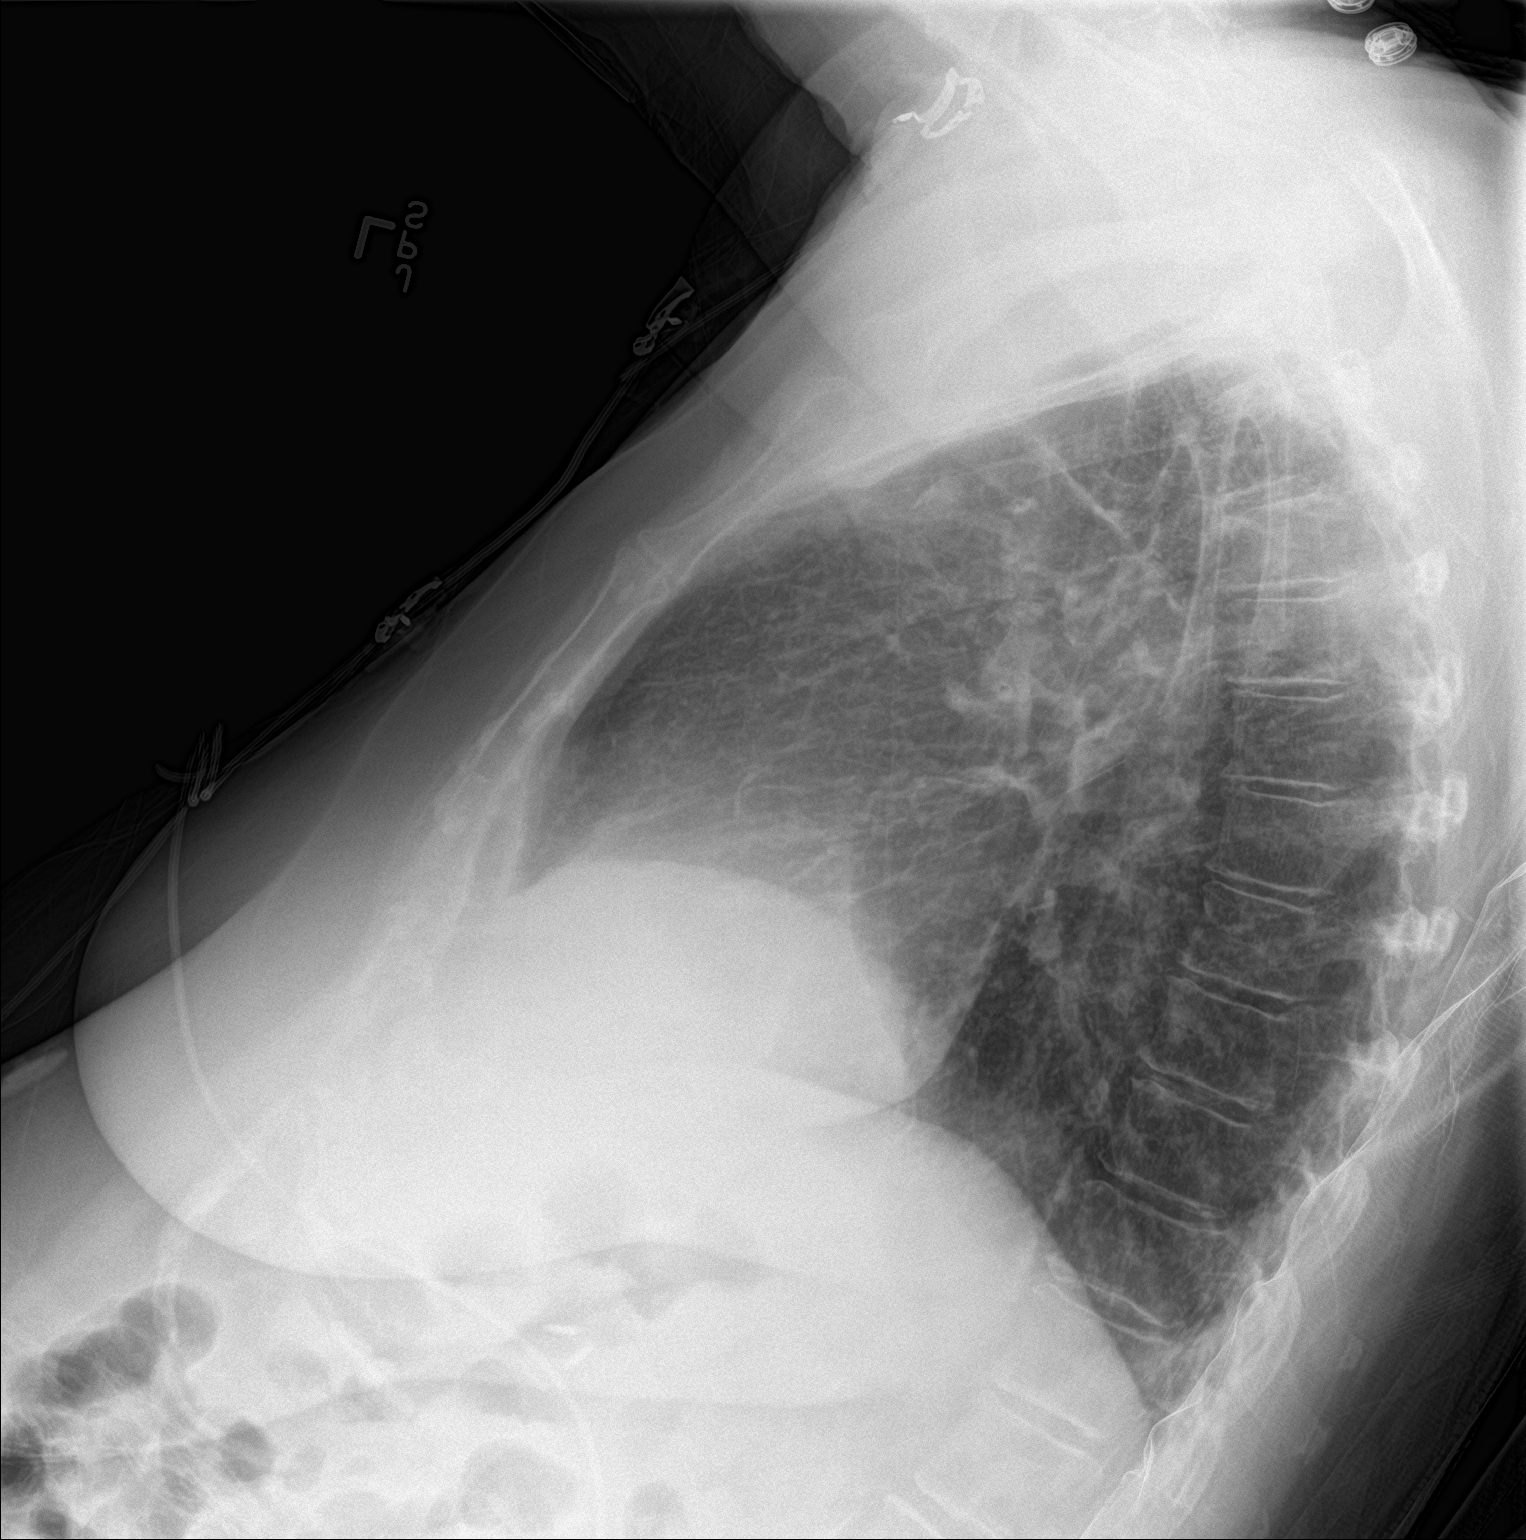

[chest ap]
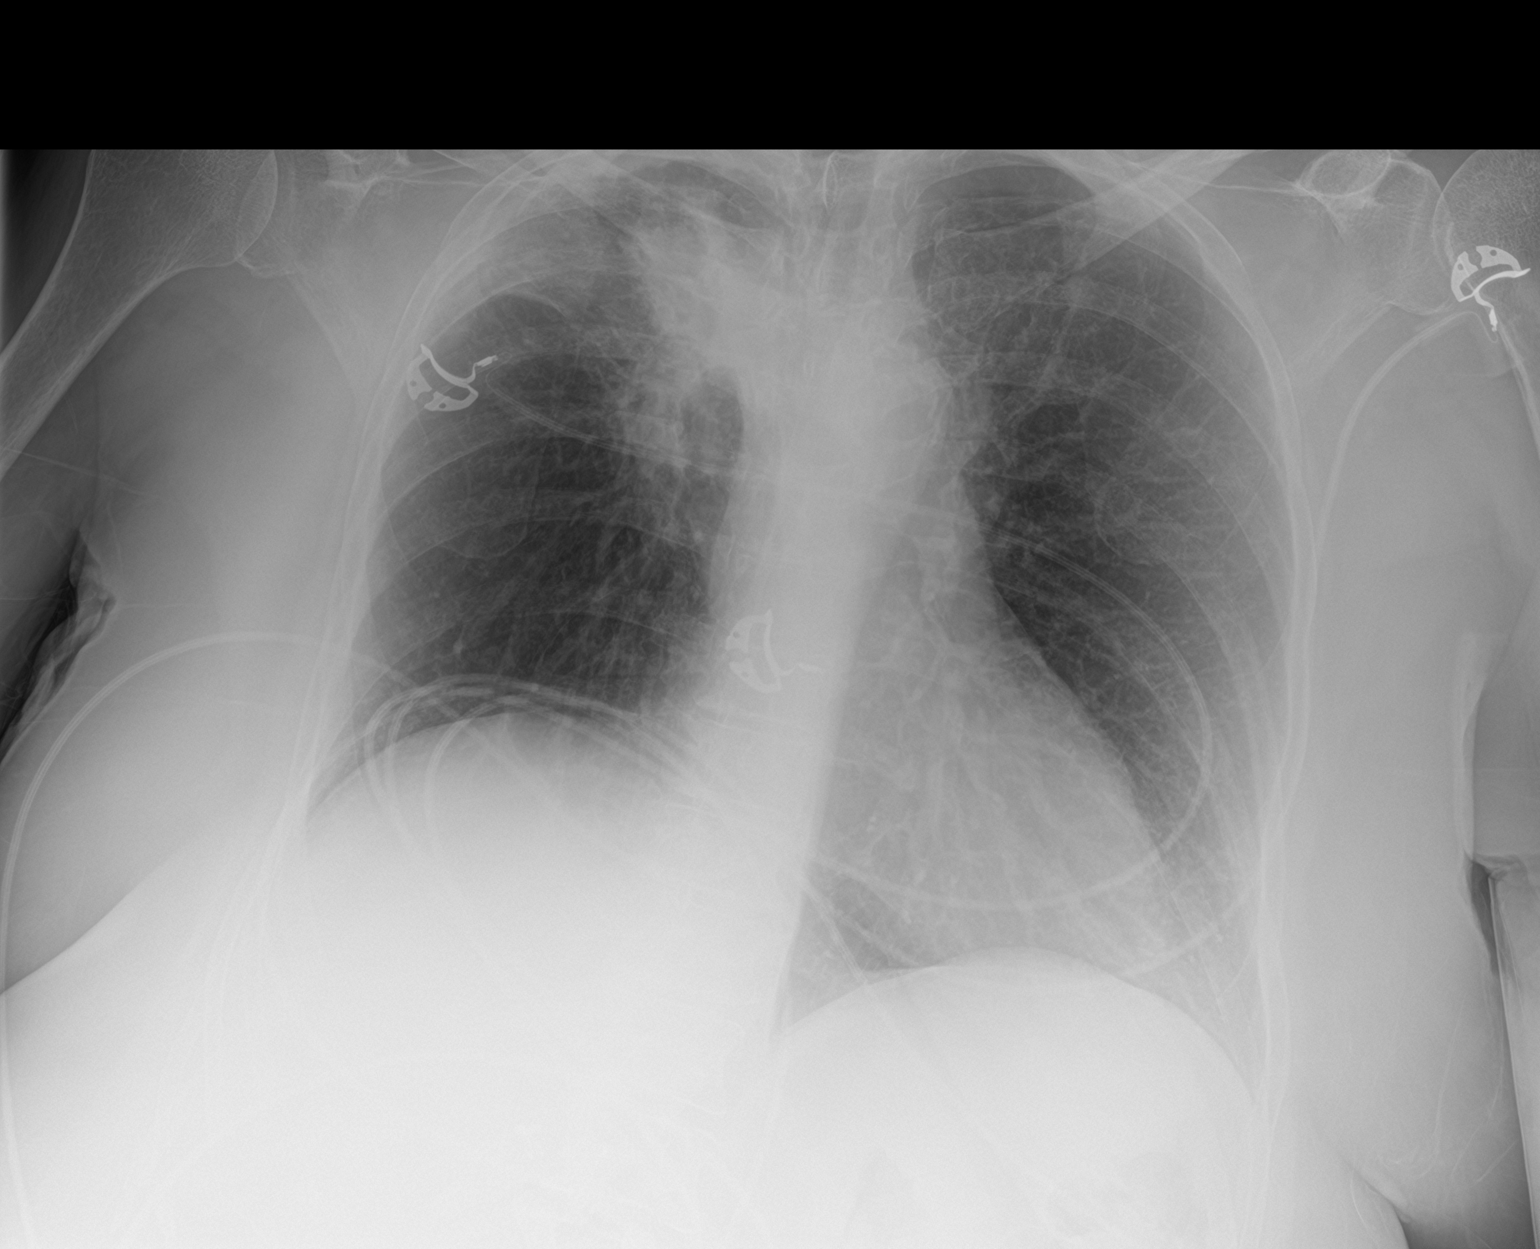

[2 of 2 positions shown; findings below may reference images not displayed]

FINDINGS: Elevated right hemidiaphragm. Right medial upper lobe density again
noted, unchanged. Left lung is clear. Heart is normal size. No acute
bony abnormality.
IMPRESSION: Stable right medial upper lobe density and elevated right
hemidiaphragm. No acute findings.

## 2017-07-16 IMAGING — CT CT IMAGE GUIDED FLUID DRAIN BY CATHETER
1 of 2 series · 15 of 32 positions shown, 19 images · non-contrast
Comparison: none

CLINICAL DATA: Sigmoid diverticulitis with focal diverticular
abscess.

[Series 2: i-spiral 5.0 b40f · axial · 0.90mm/px · z∈[+894,+1090]mm · 15 of 62 slices shown, 19 images]
[im 3/62  soft-tissue]
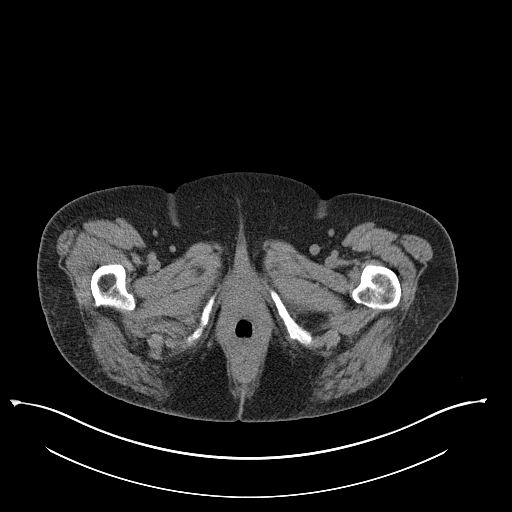
[im 3/62  bone]
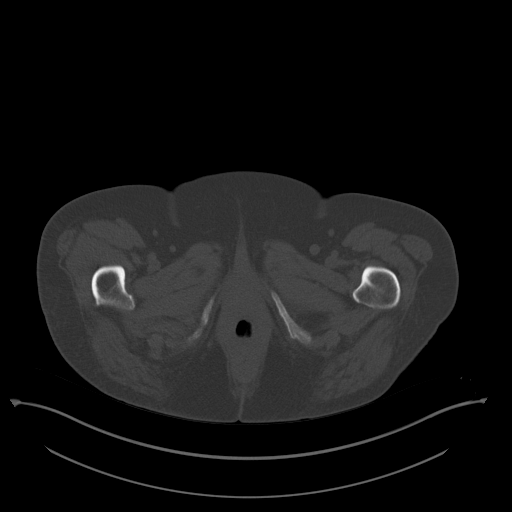
[im 9/62  soft-tissue]
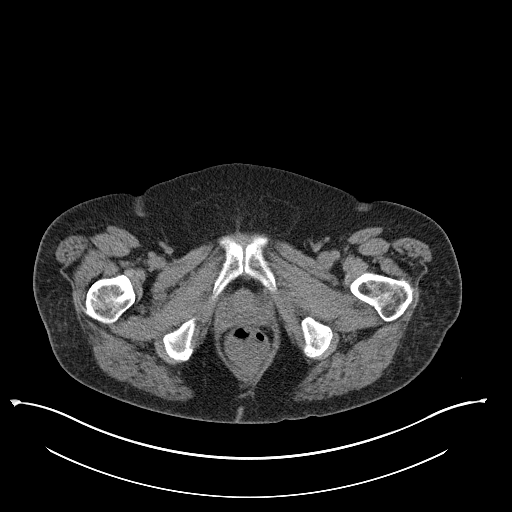
[im 14/62  soft-tissue]
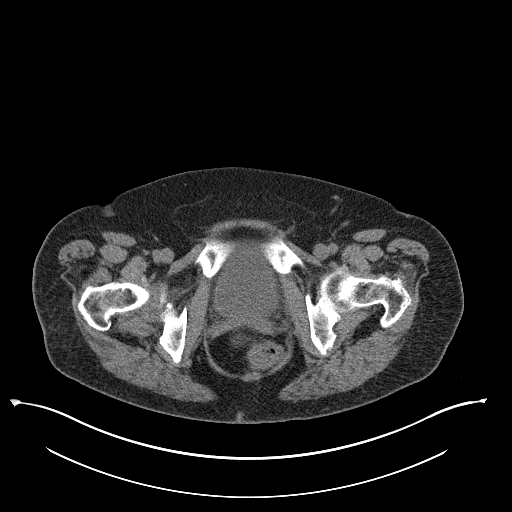
[im 17/62  soft-tissue]
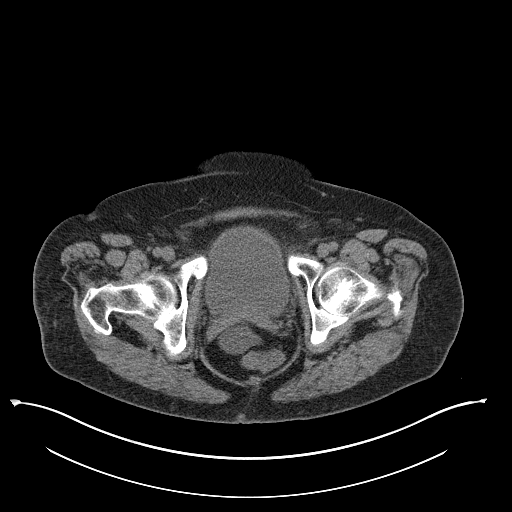
[im 23/62  soft-tissue]
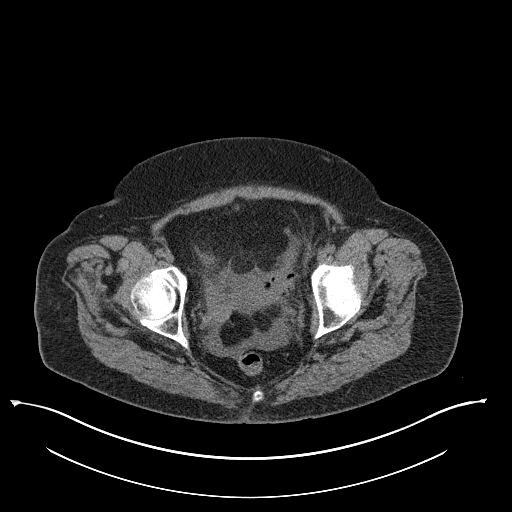
[im 25/62  soft-tissue]
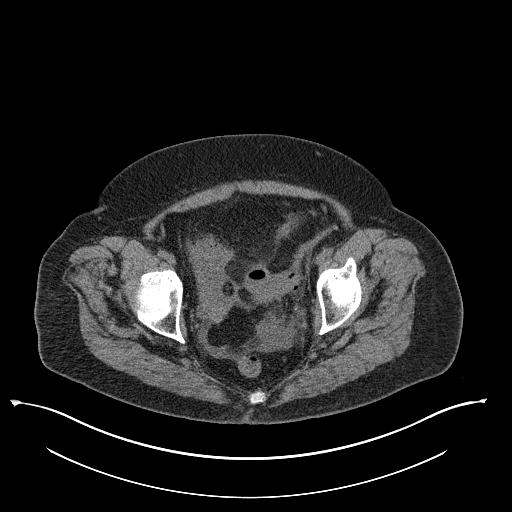
[im 31/62  soft-tissue]
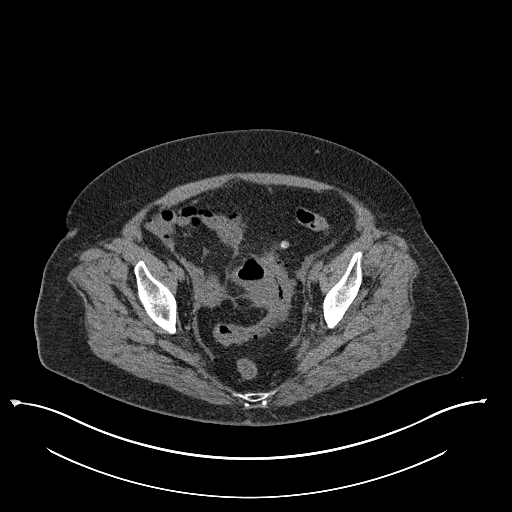
[im 37/62  soft-tissue]
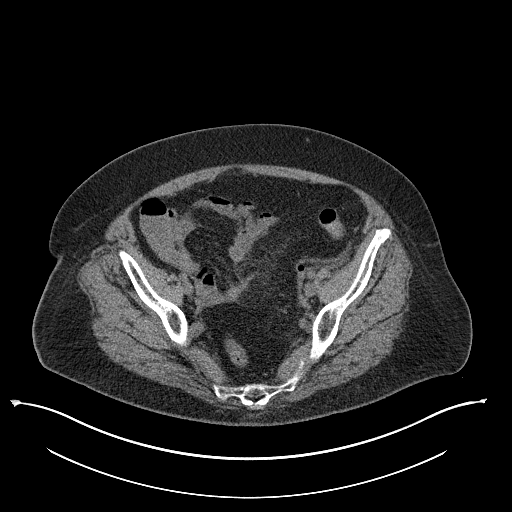
[im 39/62  soft-tissue]
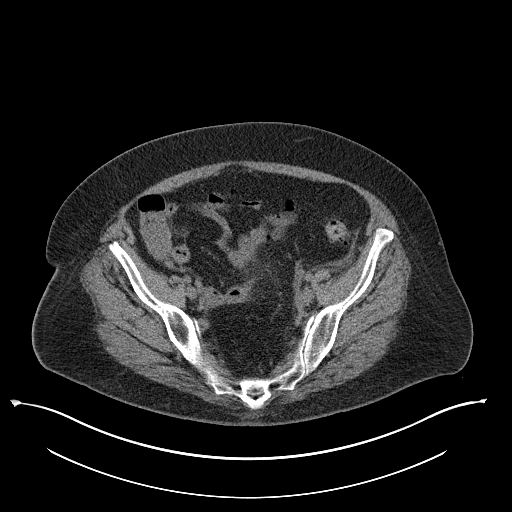
[im 39/62  bone]
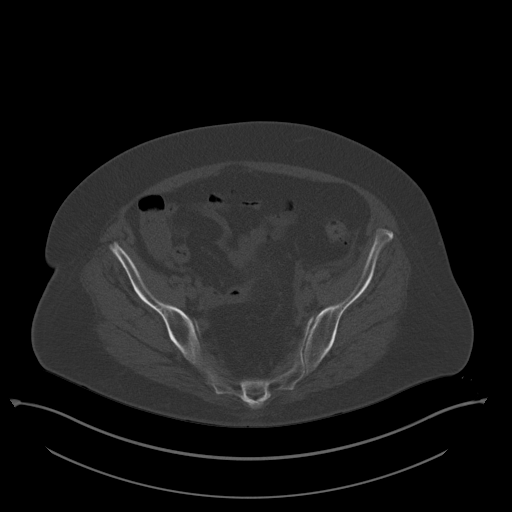
[im 45/62  soft-tissue]
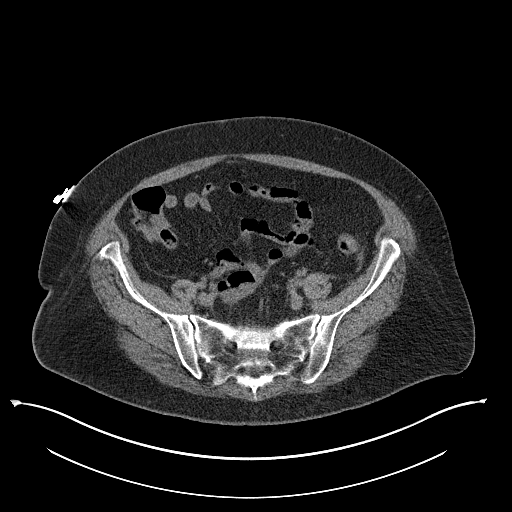
[im 48/62  soft-tissue]
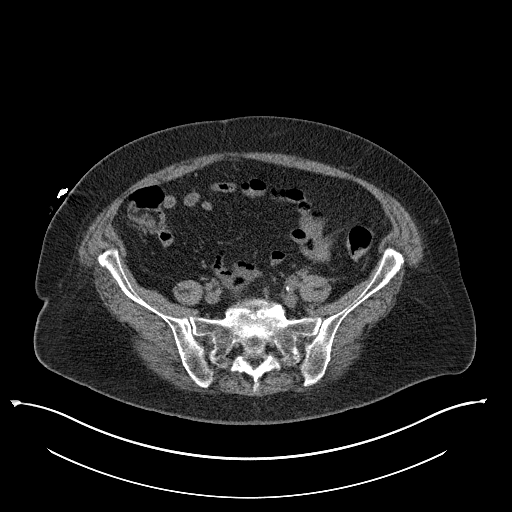
[im 50/62  lung]
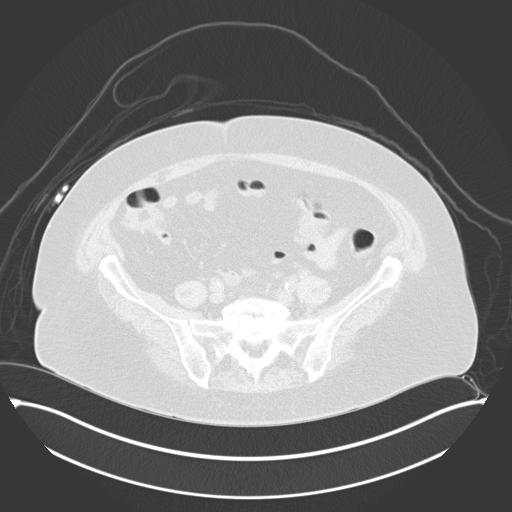
[im 53/62  soft-tissue]
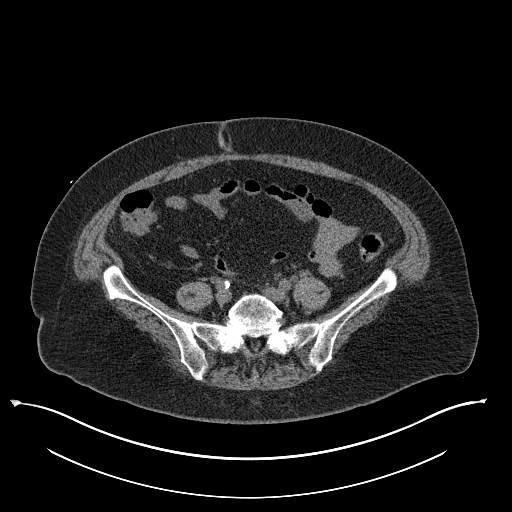
[im 53/62  lung]
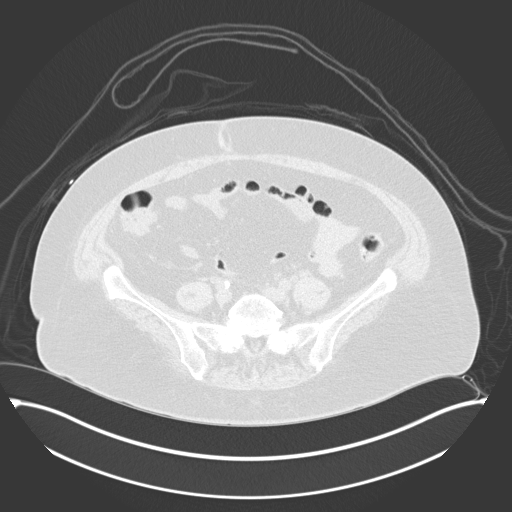
[im 56/62  lung]
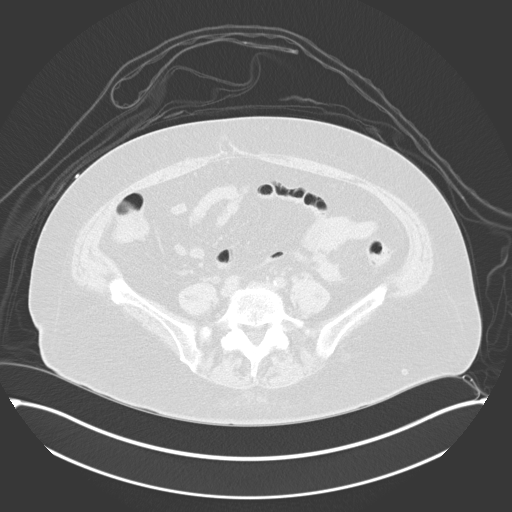
[im 59/62  soft-tissue]
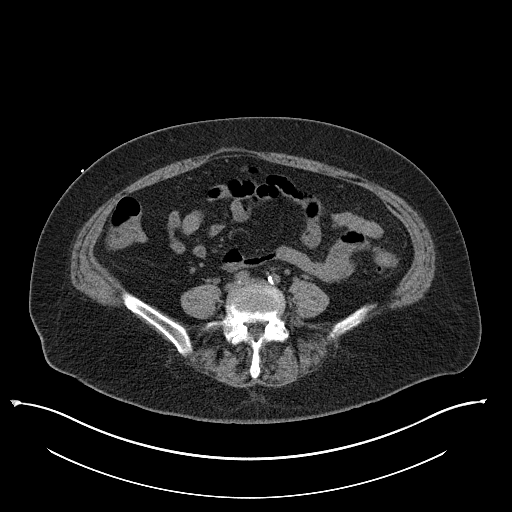
[im 59/62  lung]
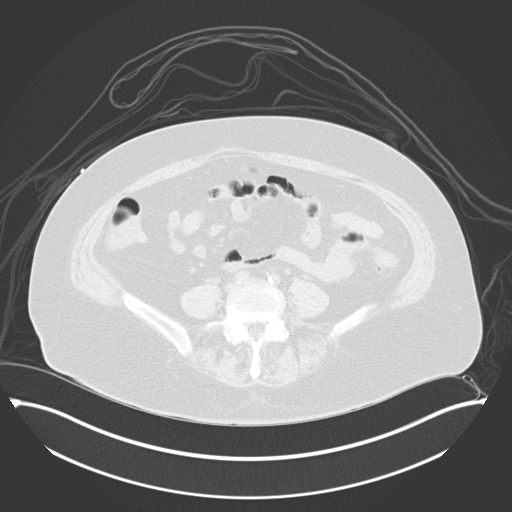

[15 of 32 positions shown; findings below may reference images not displayed]

EXAM:
CT GUIDED CATHETER DRAINAGE OF PERITONEAL ABSCESS

ANESTHESIA/SEDATION:
1.5 mg IV Versed 50 mcg IV Fentanyl

Total Moderate Sedation Time:  30 minutes

The patient's level of consciousness and physiologic status were
continuously monitored during the procedure by Radiology nursing.

PROCEDURE:
The procedure, risks, benefits, and alternatives were explained to
the patient. Questions regarding the procedure were encouraged and
answered. The patient understands and consents to the procedure. A
time out was performed prior to initiating the procedure.

The anterior abdominal wall was prepped with chlorhexidine in a
sterile fashion, and a sterile drape was applied covering the
operative field. A sterile gown and sterile gloves were used for the
procedure. Local anesthesia was provided with 1% Lidocaine.

CT was performed in a supine position through the pelvis. Under CT
guidance, an 18 gauge trocar needle was advanced to the level of a
sigmoid diverticular abscess. After advancing a needle into the
abscess, a guidewire was advanced. The tract was dilated and a 10
French percutaneous drainage catheter advanced over the wire.
Catheter position was confirmed by CT. A fluid sample was aspirated
and sent for culture analysis. The catheter was connected to suction
bulb drainage. It was secured at the skin with a Prolene retention
suture and StatLock device.

COMPLICATIONS:
None
FINDINGS: Anterior percutaneous window was present to the level of the
diverticular abscess located medial to the sigmoid colon. Aspiration
yielded bloody and purulent fluid. The drainage catheter was able to
be formed in the abscess.
IMPRESSION: CT-guided percutaneous drainage of diverticular peritoneal abscess
located adjacent to the sigmoid colon. A sample of purulent fluid
was sent for culture analysis.

## 2017-07-18 DIAGNOSIS — S42291D Other displaced fracture of upper end of right humerus, subsequent encounter for fracture with routine healing: Secondary | ICD-10-CM | POA: Diagnosis not present

## 2017-07-18 IMAGING — CR DG CHEST 1V PORT
1 series · 1 of 1 positions shown · non-contrast
Comparison: September 12, 2016 chest radiograph and chest CT June 14, 2016

CLINICAL DATA: Hypoxia

EXAM:
PORTABLE CHEST 1 VIEW

[AP]
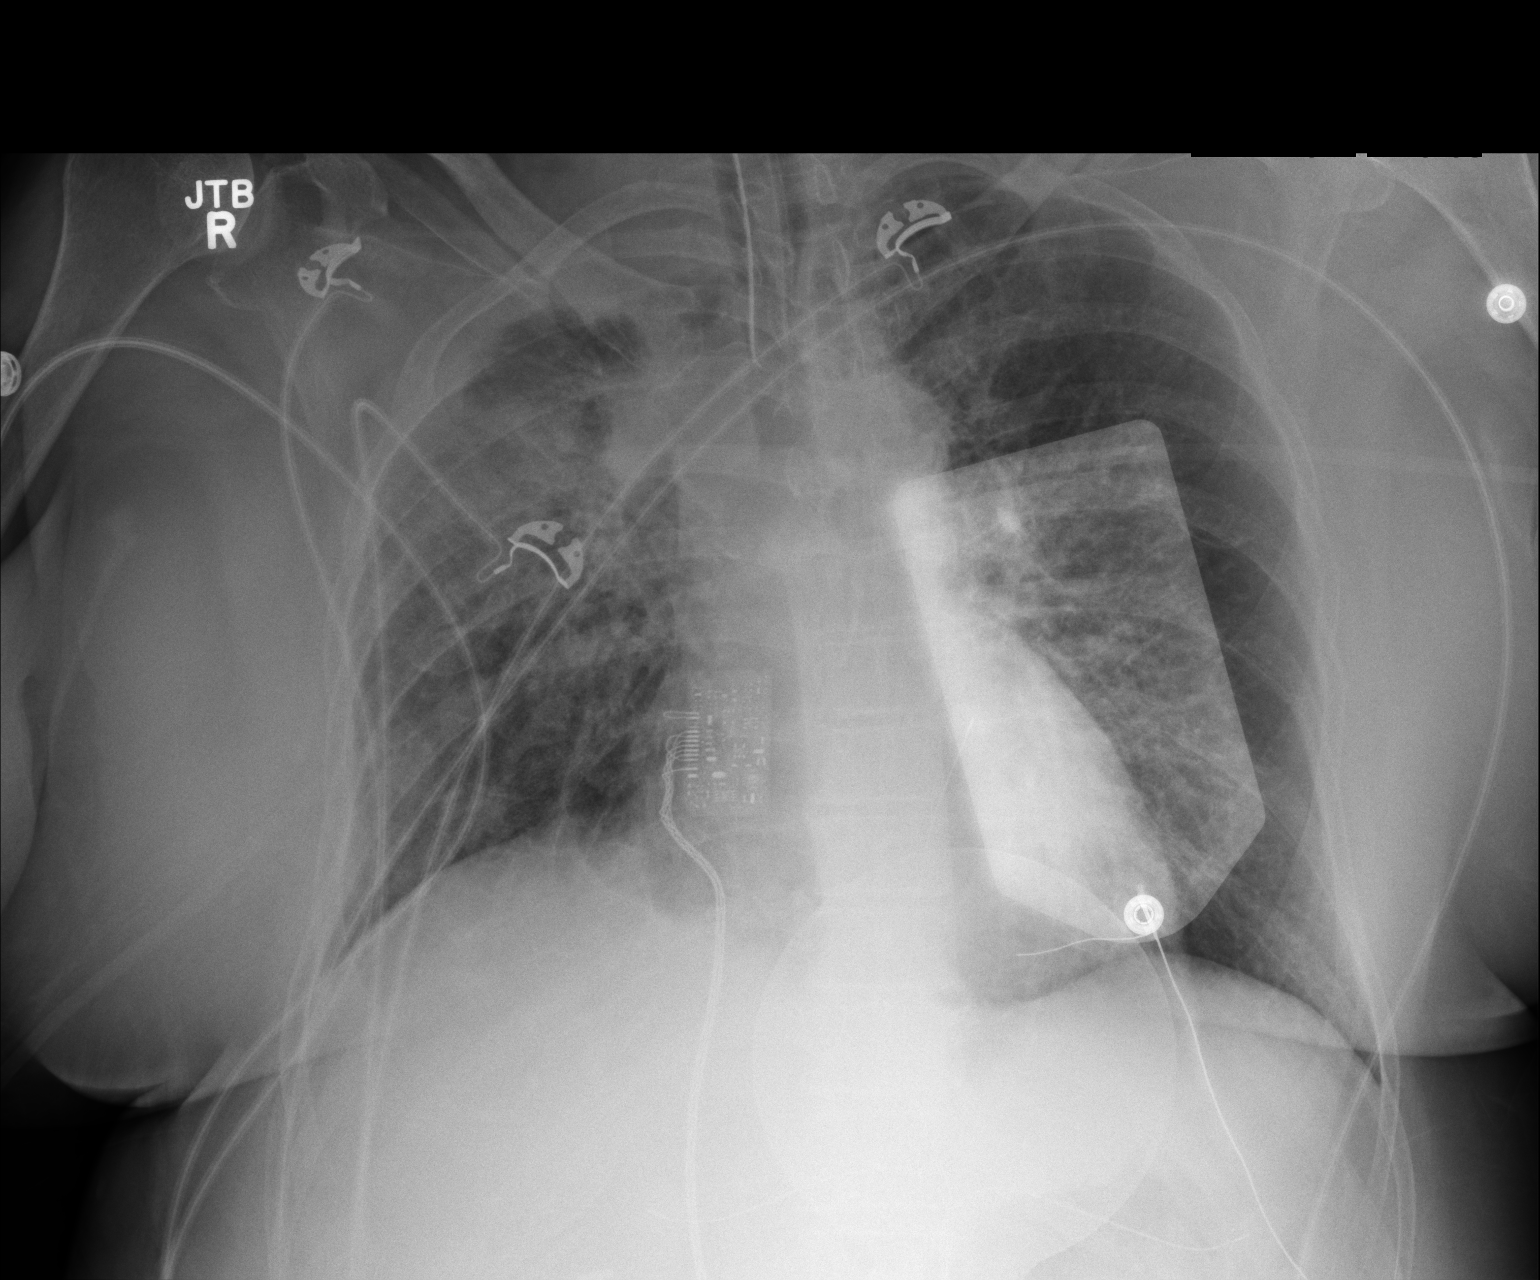

[1 of 1 positions shown; findings below may reference images not displayed]

FINDINGS: Endotracheal tube tip is 3.1 cm above the carina. No pneumothorax.
There is persistent airspace consolidation throughout much of the
right upper lobe. There is suspected residual mass in this area
medially. The lungs elsewhere are clear. Heart size and pulmonary
vascularity are normal. No adenopathy. There is atherosclerotic
calcification in the aorta. No bone lesions. Incidental note is made
of small cervical ribs bilaterally.
IMPRESSION: Endotracheal tube as described without pneumothorax. There is
airspace consolidation throughout much the right upper lobe. There
is a nodular area in the medial aspect of the right upper lobe
measuring 2.3 x 2.0 cm, concerning for residual mass in this area.
This finding may warrant correlation with contrast enhanced chest CT
to further assess. There may also be post radiation fibrosis in the
right upper lobe superimposed on the other changes. Lungs elsewhere
clear. Stable cardiac silhouette. There is aortic atherosclerosis.

## 2017-07-18 IMAGING — DX DG CHEST 1V PORT
1 series · 1 of 1 positions shown · non-contrast
Comparison: 09/14/2016

CLINICAL DATA: Central line placement

EXAM:
PORTABLE CHEST 1 VIEW

[chest ap]
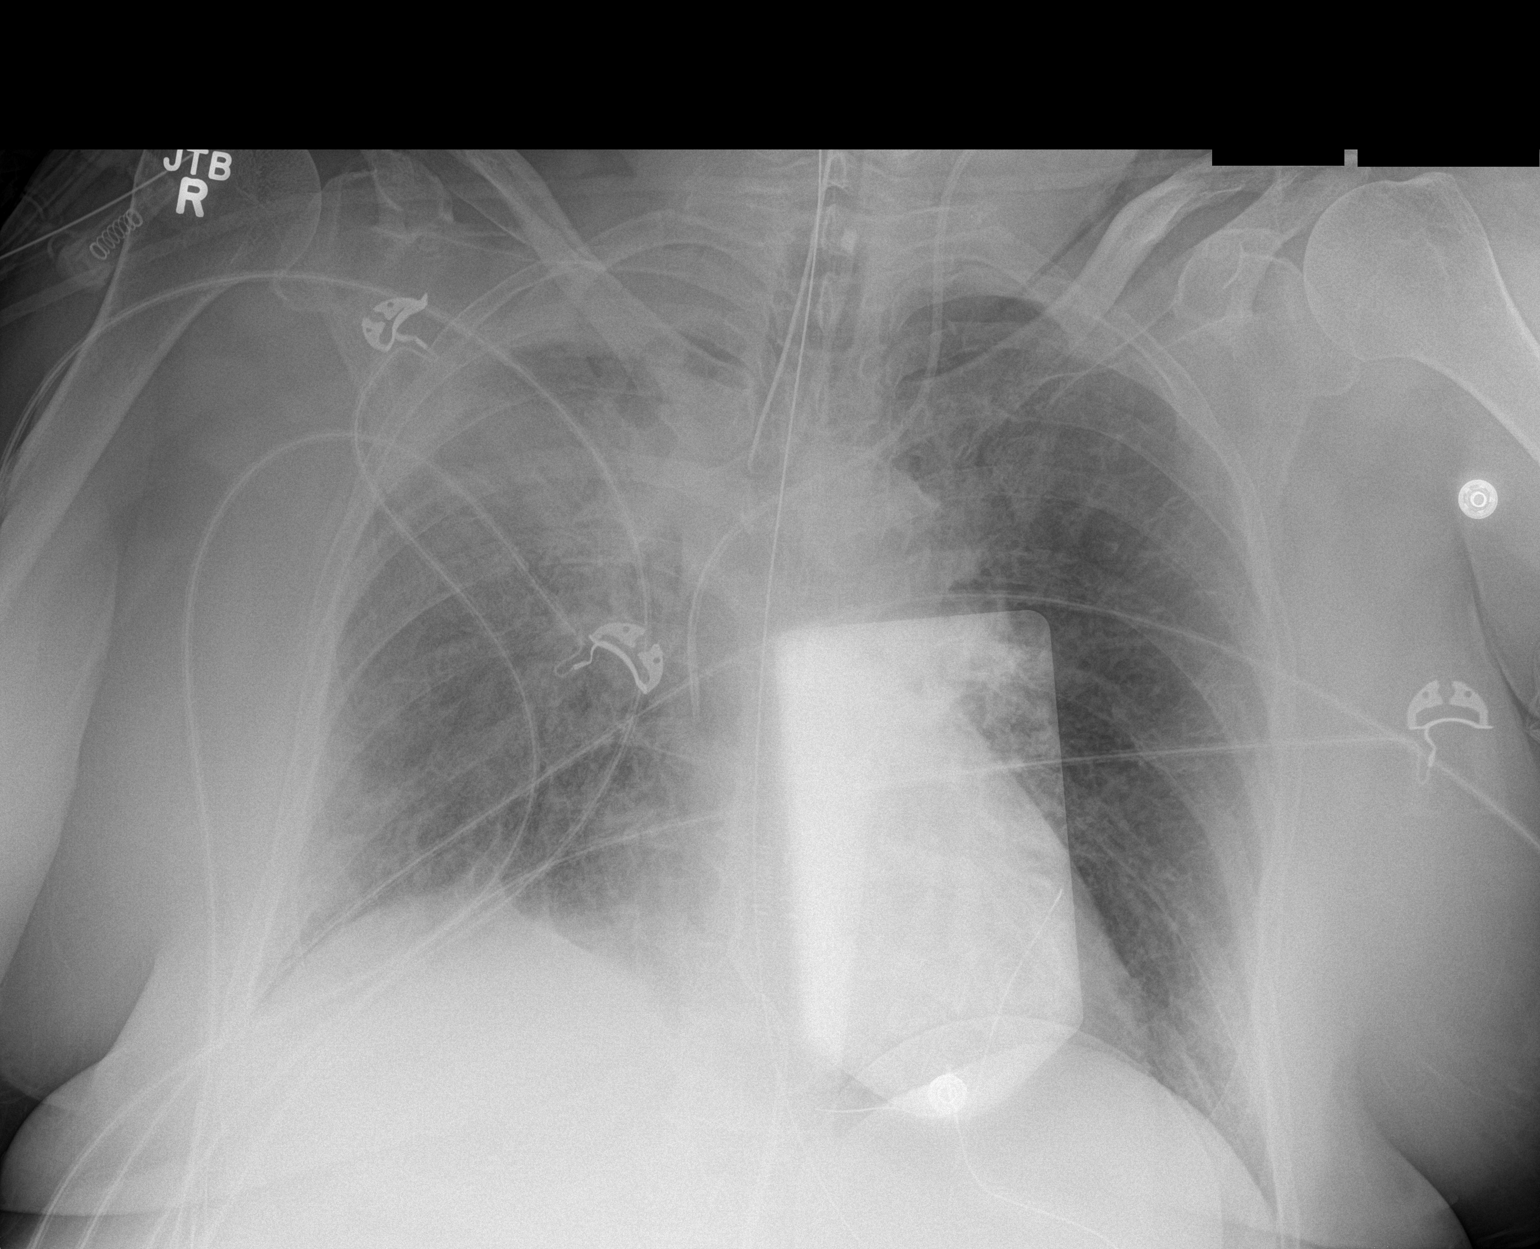

[1 of 1 positions shown; findings below may reference images not displayed]

FINDINGS: Cardiomediastinal silhouette is stable. Stable endotracheal tube
position. NG tube in place. There is persistent consolidation in
right upper lobe. Left IJ central line with tip in distal SVC. No
pneumothorax.
IMPRESSION: Stable support apparatus. Left IJ central line in place. No
pneumothorax.

## 2017-07-18 IMAGING — CT CT ABD-PELV W/ CM
2 of 5 series · 13 of 36 positions shown, 16 images · IV contrast (Omni 300)
Comparison: CT chest 06/14/2016, CT abdomen and pelvis 09/11/2016

CLINICAL DATA: Diverticulitis post abscess drainage and surgery,
complicated by a supraventricular tachycardia then cardiac arrest
post CPR and defibrillation, intubation, hypertensive, history COPD,
coronary disease, diabetes mellitus, hypertension, non-small-cell
lung cancer

EXAM:
CT CHEST, ABDOMEN, AND PELVIS WITH CONTRAST
TECHNIQUE: Multidetector CT imaging of the chest, abdomen and pelvis was
performed following the standard protocol during bolus
administration of intravenous contrast. Sagittal and coronal MPR
images reconstructed from axial data set.
CONTRAST:  Dilute oral contrast.  100 cc Isovue 300 IV.

[Series 2: cap with 5mm st · axial · 0.85mm/px · z∈[+698,+1193]mm · 10 of 123 slices shown, 13 images]
[im 12/123  mediastinal]
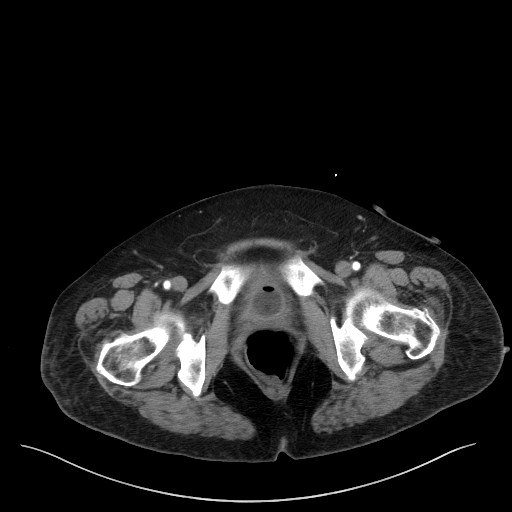
[im 12/123  lung]
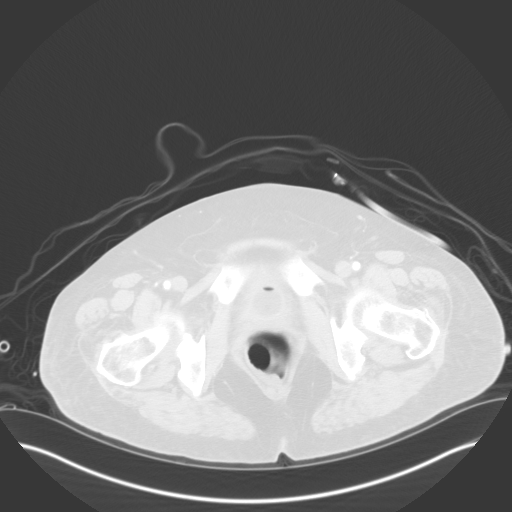
[im 23/123  lung]
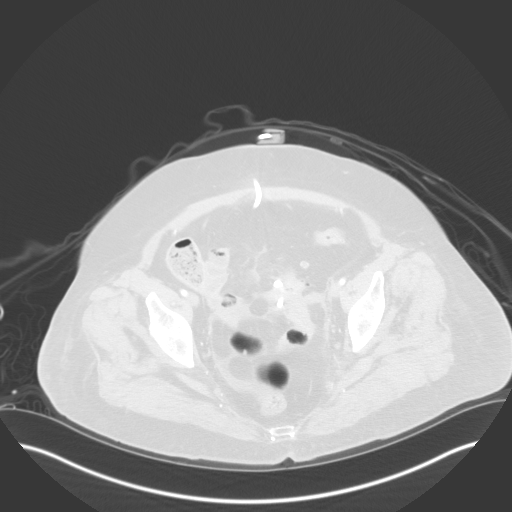
[im 34/123  lung]
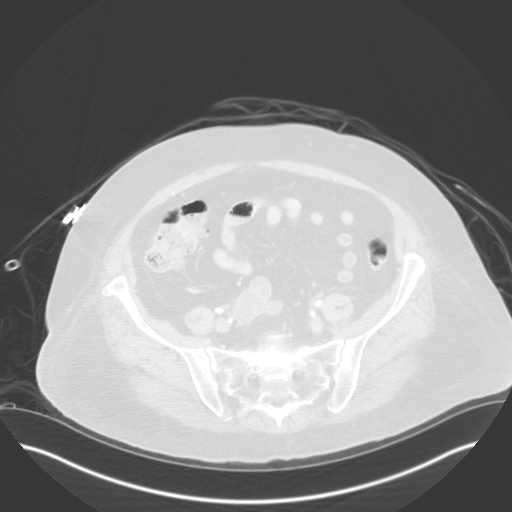
[im 45/123  lung]
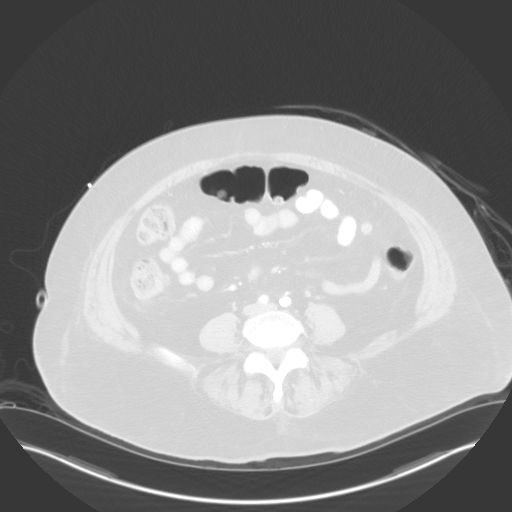
[im 56/123  mediastinal]
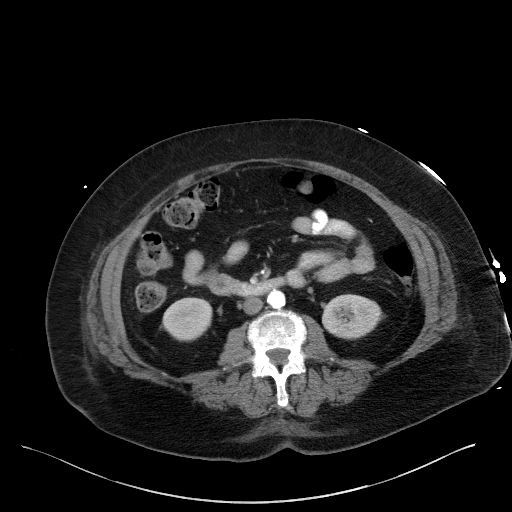
[im 56/123  lung]
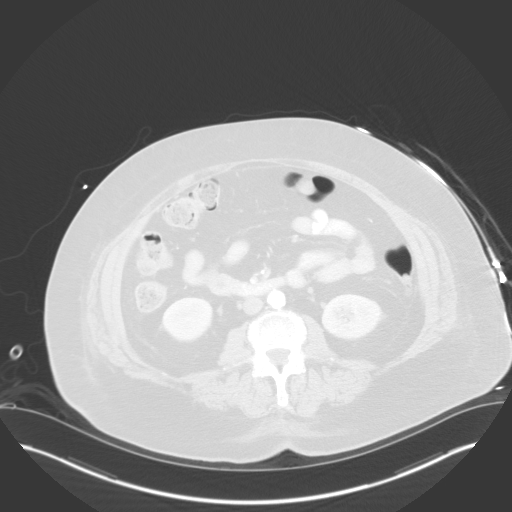
[im 67/123  lung]
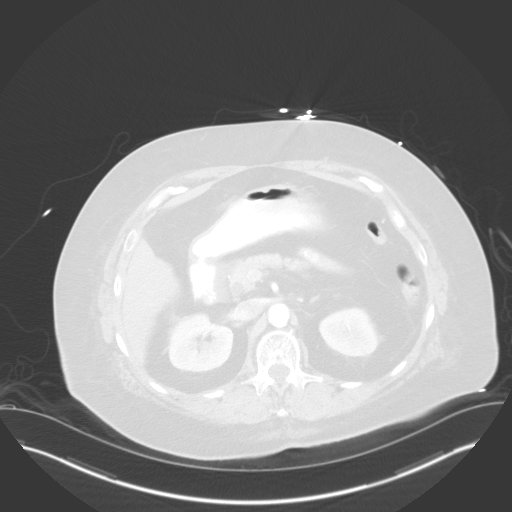
[im 78/123  lung]
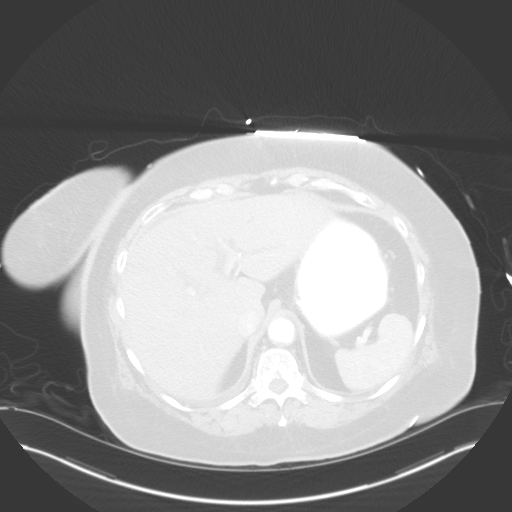
[im 89/123  lung]
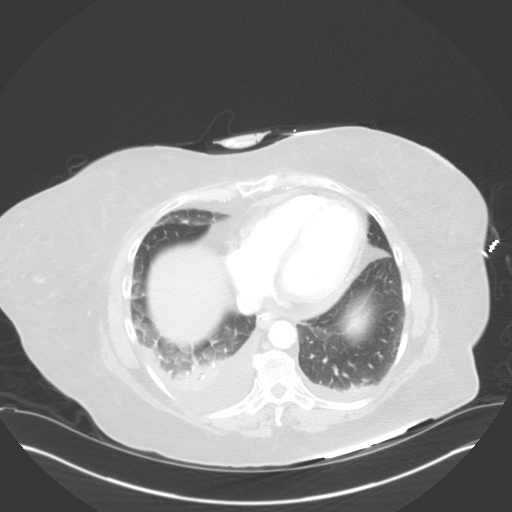
[im 100/123  mediastinal]
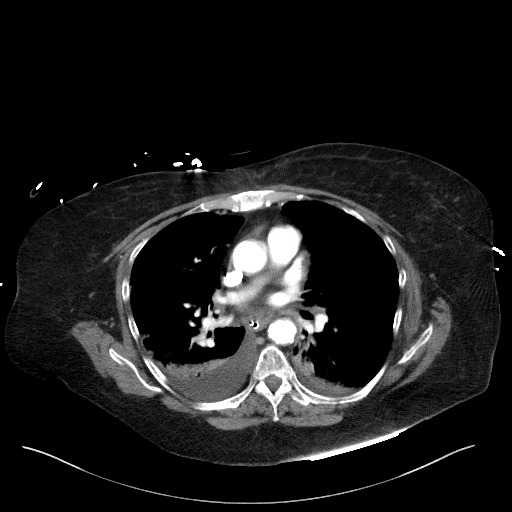
[im 100/123  lung]
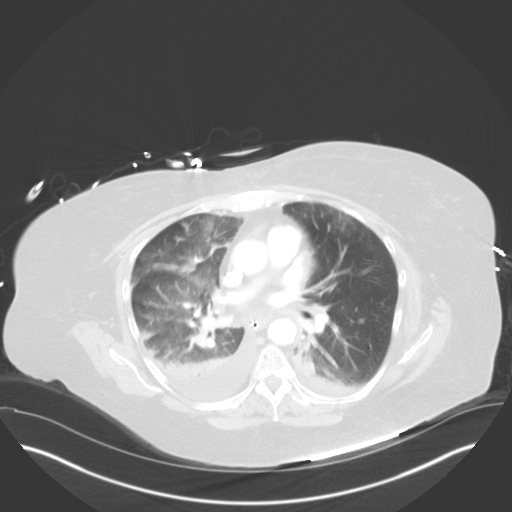
[im 111/123  lung]
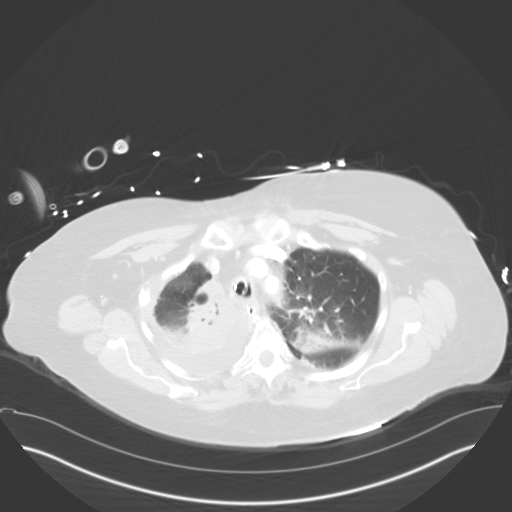

[Series 4: cap with 3mm st cor · coronal · 0.74mm/px · 3 of 151 slices shown]
[im 31/151  lung]
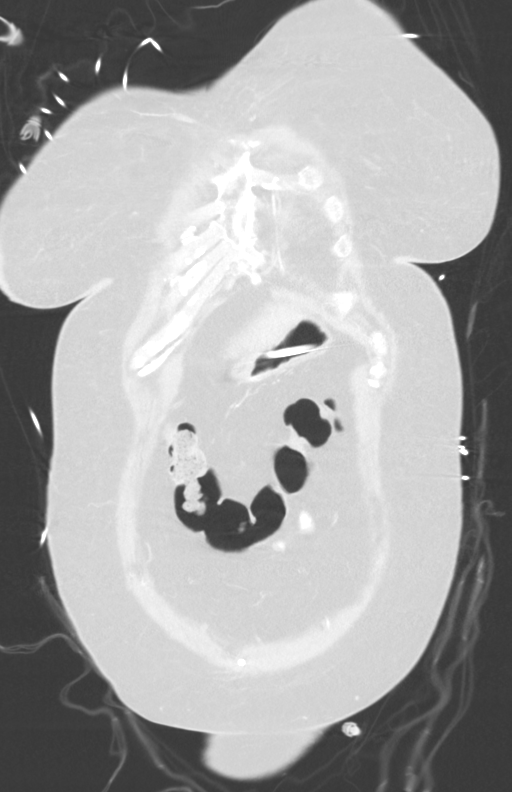
[im 61/151  lung]
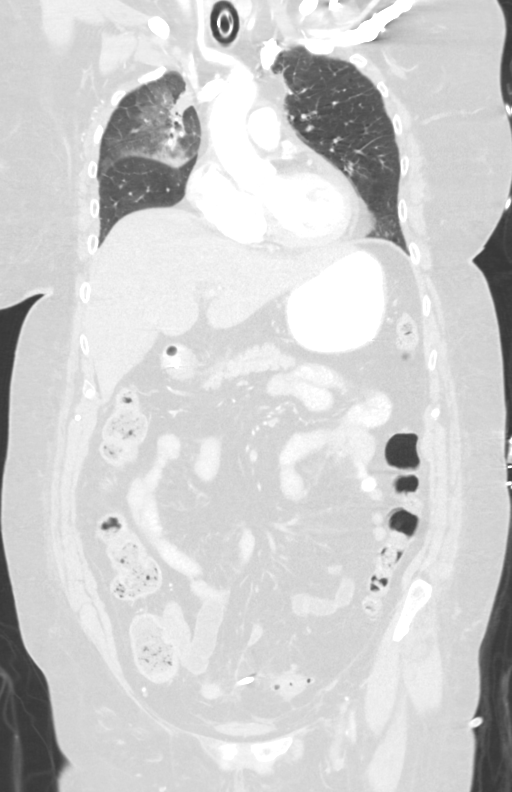
[im 91/151  lung]
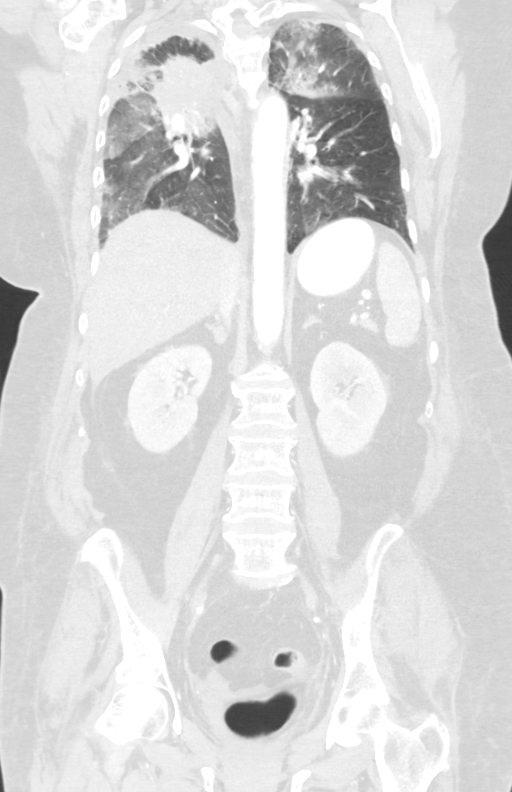

[13 of 36 positions shown; findings below may reference images not displayed]

FINDINGS: CT CHEST FINDINGS

Cardiovascular: Atherosclerotic calcifications aorta and coronary
arteries. Aorta normal caliber. Thoracic vascular structures grossly
patent on nondedicated exam. Small pericardial fusion.

Mediastinum/Nodes: Nasogastric tube traverses esophagus into
stomach. Tip of endotracheal tube above carina. Small dense
calcification within RIGHT thyroid lobe is unchanged. Base of
cervical region otherwise unremarkable. Palpable with soft tissue
density at the RIGHT hilum and paramediastinal RIGHT upper lobe,
patient with known radiation fibrosis.

Lungs/Pleura: Small BILATERAL pleural effusions larger on RIGHT. In
addition to the previously identified radiation fibrosis changes in
the RIGHT upper lobe, additional RIGHT upper lobe consolidation is
seen question pneumonia. Compressive atelectasis in the lower lobes.
Additional scattered airspace infiltrates primarily in RIGHT lung
which could represent infection or less likely asymmetric edema. No
pneumothorax.

Musculoskeletal: Essentially nondisplaced mid sternal fracture
sagittal image 81. Fractures of the anterior RIGHT third and fifth
ribs.

CT ABDOMEN PELVIS FINDINGS

Hepatobiliary: Gallbladder surgically absent. Mild central
intrahepatic and extrahepatic biliary dilatation little changed. No
focal liver lesions.

Pancreas: Normal appearance

Spleen: Normal appearance

Adrenals/Urinary Tract: Adrenal glands normal appearance. Foley
catheter decompresses urinary bladder. Kidneys in ureters normal
appearance.

Stomach/Bowel: Diverticulosis changes at the sigmoid colon with
decreased surrounding inflammation versus prior study. Abscess
collection seen on the previous exam no longer identified, pigtail
drainage catheter at site. Normal appendix. Stomach and bowel loops
otherwise normal appearance.

Vascular/Lymphatic: Atherosclerotic calcification aorta without
aneurysm. No significant adenopathy.

Reproductive: Uterus surgically absent.

Other: Small amounts of free pelvic fluid and
perihepatic/perisplenic fluid without free air. Small umbilical
hernia containing fat.

Musculoskeletal: No acute osseous findings.
IMPRESSION: Radiation fibrosis changes in medial RIGHT upper lobe extending to
the superior RIGHT hilum.

Additional RIGHT upper lobe and scattered RIGHT lung infiltrates
question pneumonia versus less likely asymmetric edema.

Small BILATERAL pleural effusions RIGHT greater than LEFT with
minimal adjacent compressive atelectasis of the lower lobes.

Improved diverticulitis changes with resolution of the previously
identified abscess collection adjacent to the sigmoid colon.

Fractures of the sternum and the anterior RIGHT third and fifth ribs
suspect related to CPR.

Small pericardial fusion.

## 2017-07-18 IMAGING — CR DG CHEST 1V PORT
1 series · 1 of 1 positions shown · non-contrast
Comparison: 09/14/2016 [DATE] a.m.

CLINICAL DATA: Central line insertion.

EXAM:
PORTABLE CHEST 1 VIEW [DATE] p.m.

[AP]
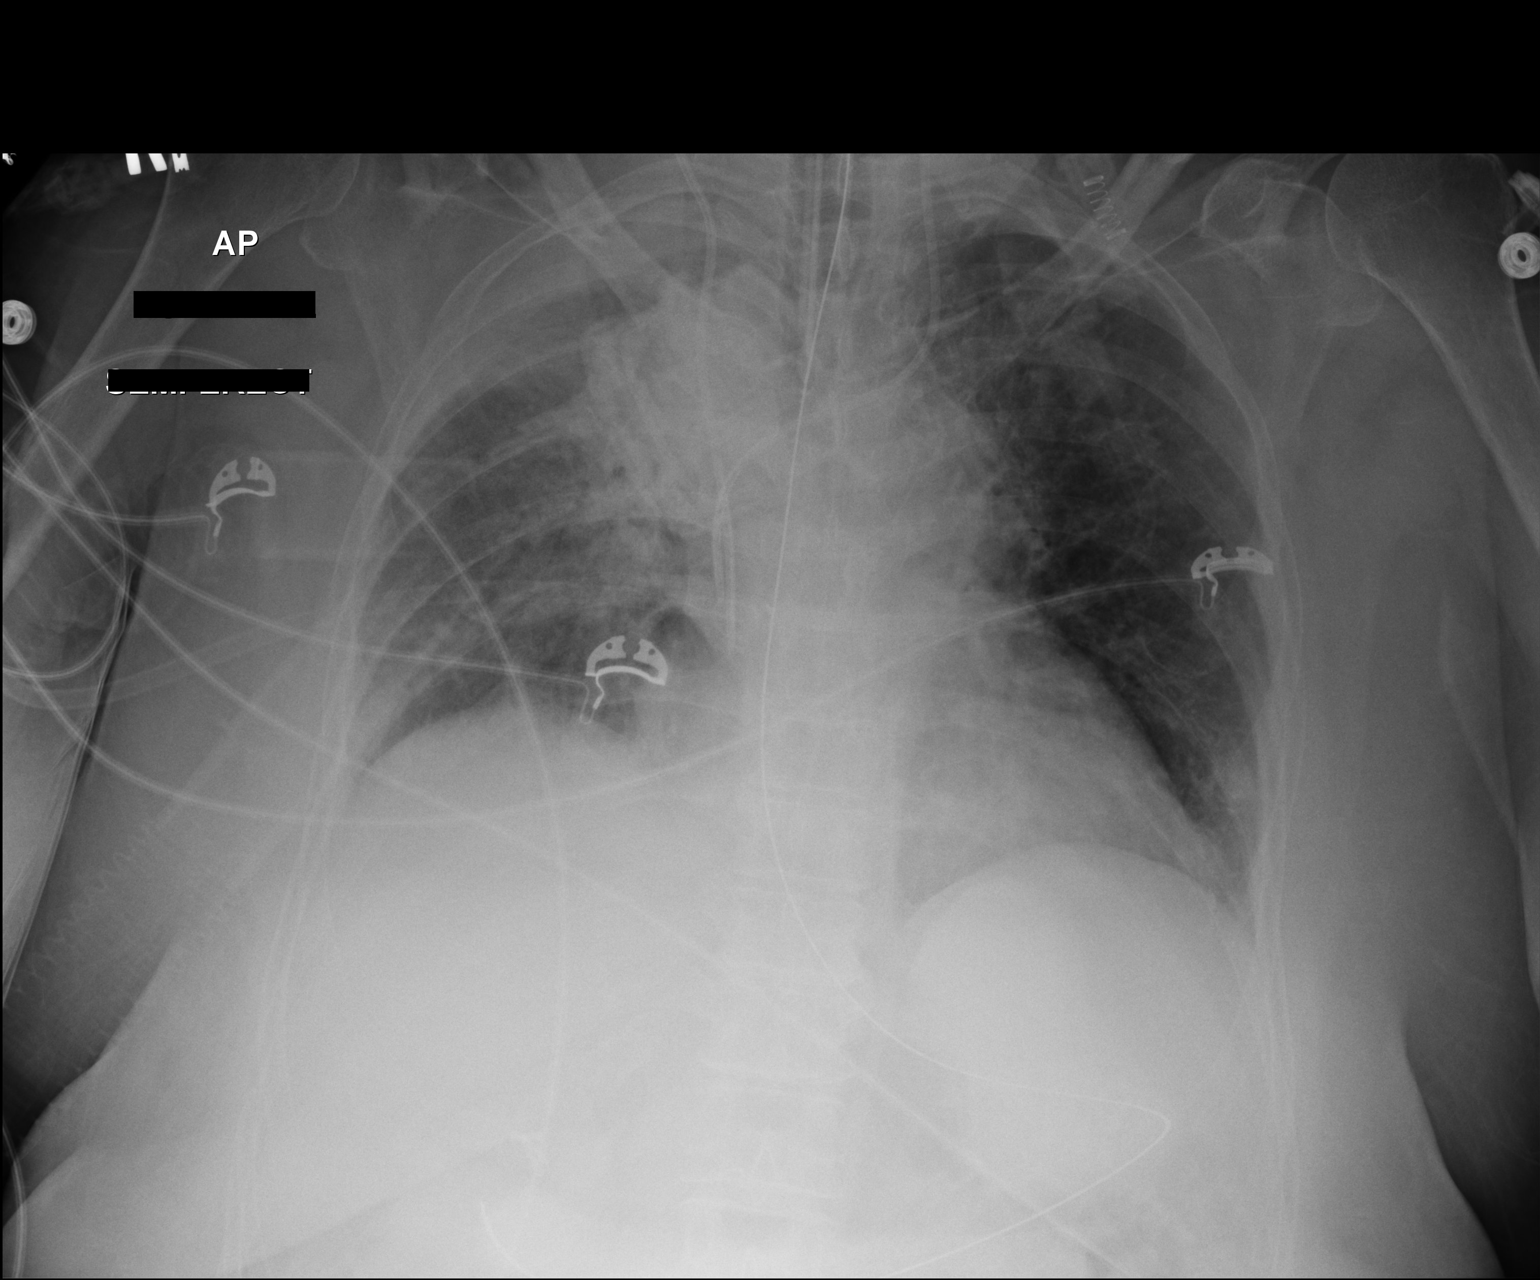

[1 of 1 positions shown; findings below may reference images not displayed]

FINDINGS: New right central line is been inserted and the tip appears in
excellent position. Left central line, endotracheal tube, and NG
tube appear in good position, unchanged. Right pleural effusion caps
the right apex, unchanged. No pneumothorax. Scarring at the superior
aspect of the right hilum, obscured by the ventilation apparatus.
IMPRESSION: New right-sided central line appears in good position. No acute
changes.

## 2017-07-23 ENCOUNTER — Other Ambulatory Visit: Payer: Self-pay | Admitting: Family Medicine

## 2017-07-23 IMAGING — XA IR FISTULA/SINUS TRACT
1 series · 3 of 3 positions shown · non-contrast
Comparison: none

INDICATION: 70-year-old female with a history of diverticular abscess, status
post drainage.

[Series 300: dsa body · 3 of 3 slices shown]
[im 1/3]
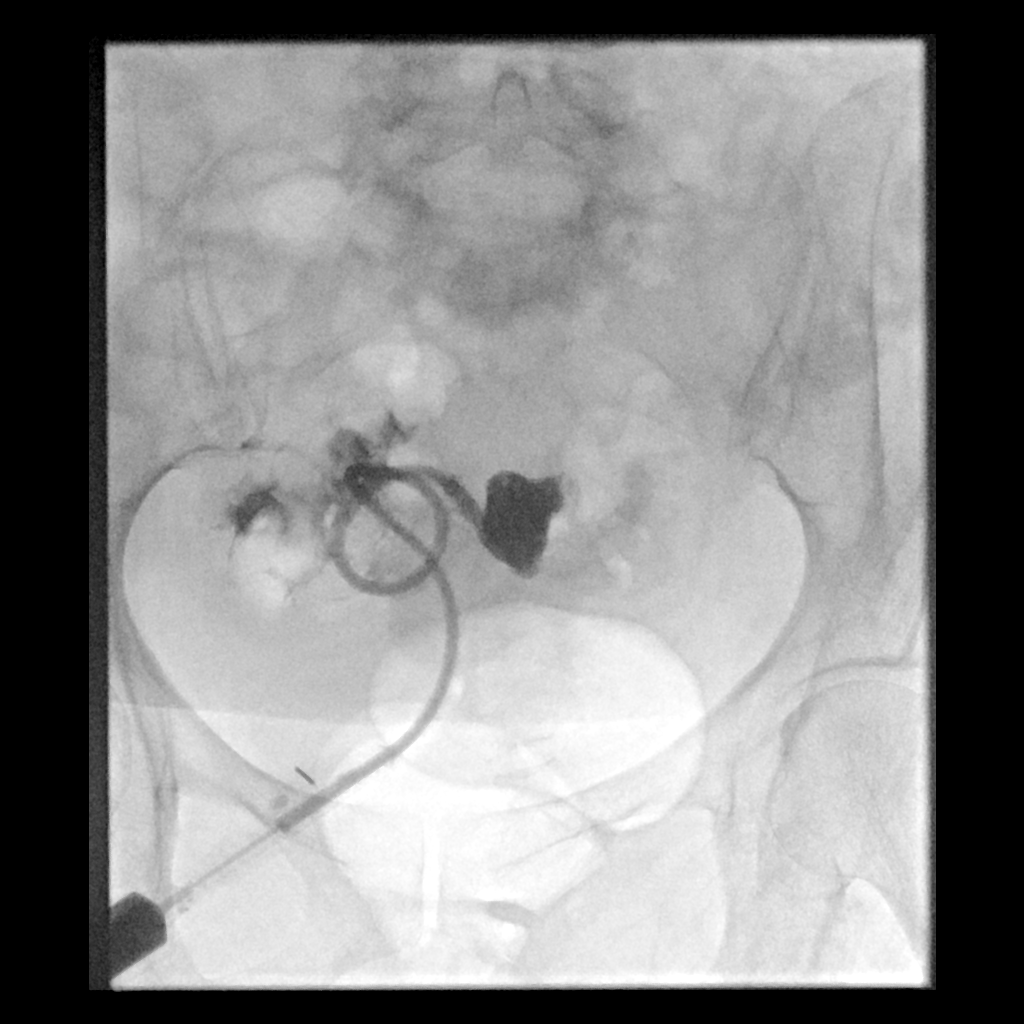
[im 2/3]
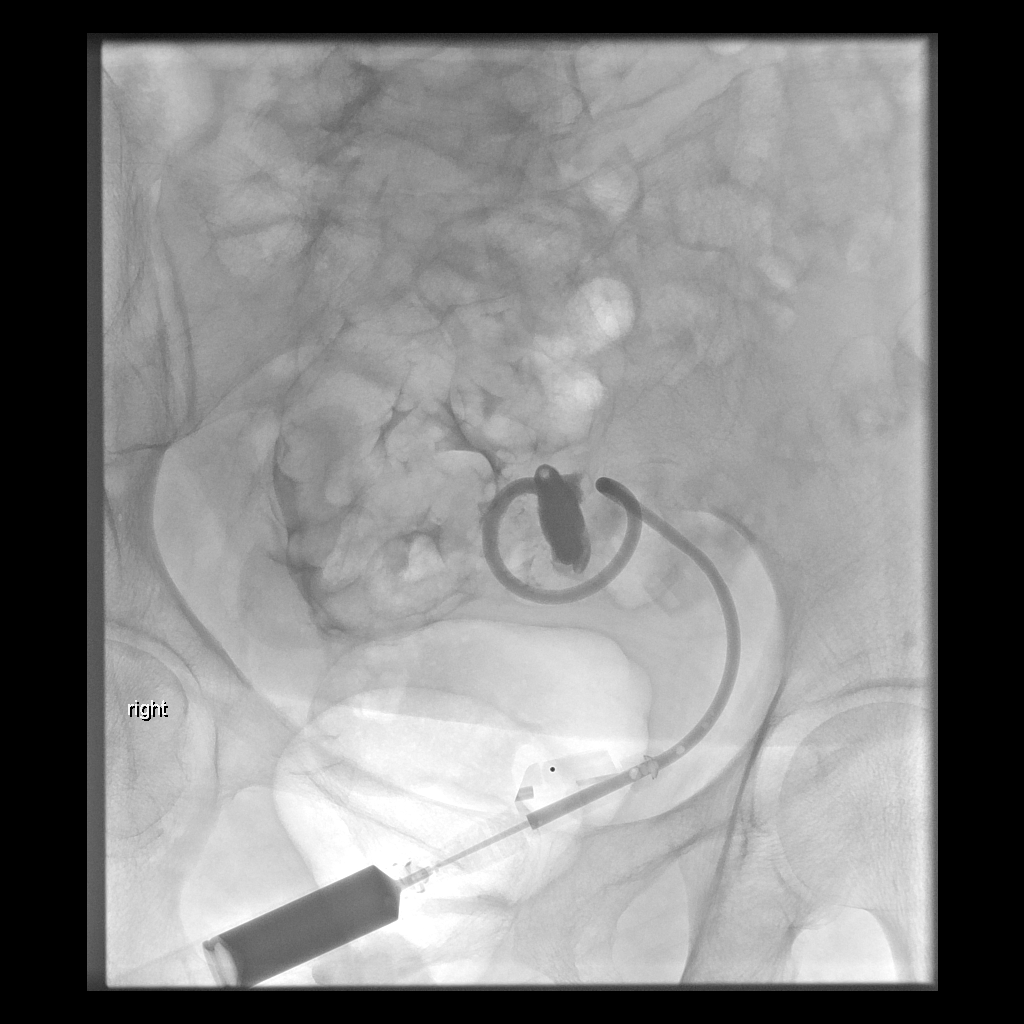
[im 3/3]
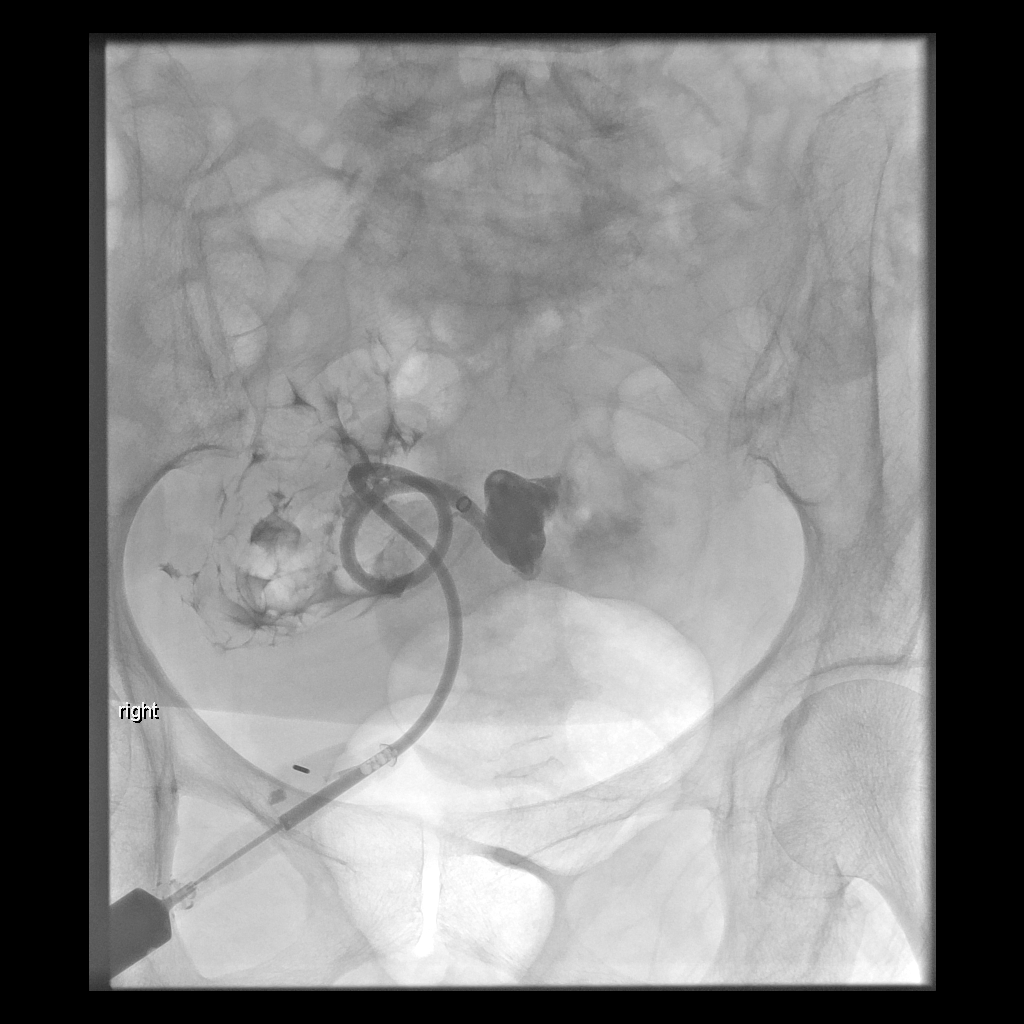

[3 of 3 positions shown; findings below may reference images not displayed]

EXAM:
SINUS TRACT INJECTION/FISTULOGRAM

MEDICATIONS:
The patient is currently admitted to the hospital and receiving
intravenous antibiotics. The antibiotics were administered within an
appropriate time frame prior to the initiation of the procedure.

ANESTHESIA/SEDATION:
None

COMPLICATIONS:
None

PROCEDURE:
Informed written consent was obtained from the patient after a
thorough discussion of the procedural risks, benefits and
alternatives. All questions were addressed. Maximal Sterile Barrier
Technique was utilized.

Time out was performed.

The indwelling pelvis drain was injected under fluoroscopy.

Spot images were acquired in multiple obliquities.

Catheter was attached to drainage bag.
FINDINGS: No fistula identified.

Free contrast within the pelvis with no large residual collection.
IMPRESSION: Status post drain injection demonstrating no fistula or residual
large pelvic collection.

## 2017-07-24 IMAGING — CT CT ABD-PELV W/ CM
2 of 5 series · 10 of 46 positions shown, 11 images · IV contrast (Iodine)
Comparison: CT scan of September 14, 2016.

CLINICAL DATA: Diverticulitis.

EXAM:
CT ABDOMEN AND PELVIS WITH CONTRAST
TECHNIQUE: Multidetector CT imaging of the abdomen and pelvis was performed
using the standard protocol following bolus administration of
intravenous contrast.
CONTRAST:  100mL [id number] IOPAMIDOL ([id number]) INJECTION 61%

[Series 201: routine, idose (2) · axial · 0.78mm/px · z∈[+66,+446]mm · 7 of 98 slices shown, 8 images]
[im 11/98  soft-tissue]
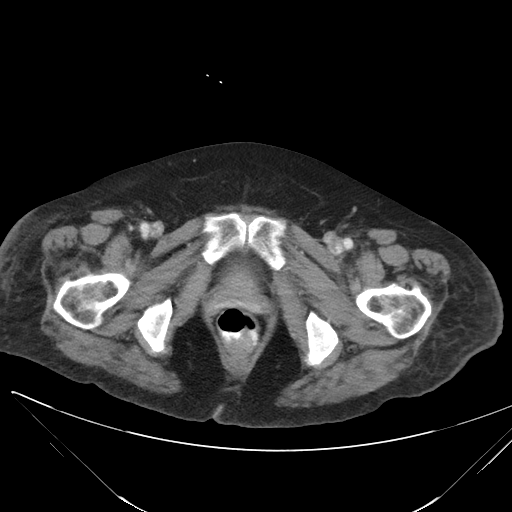
[im 11/98  bone]
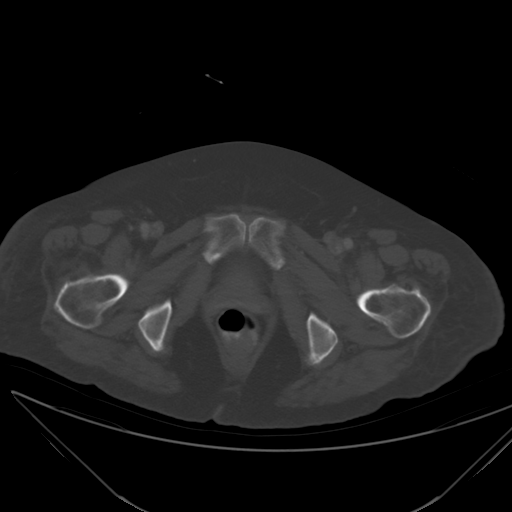
[im 21/98  soft-tissue]
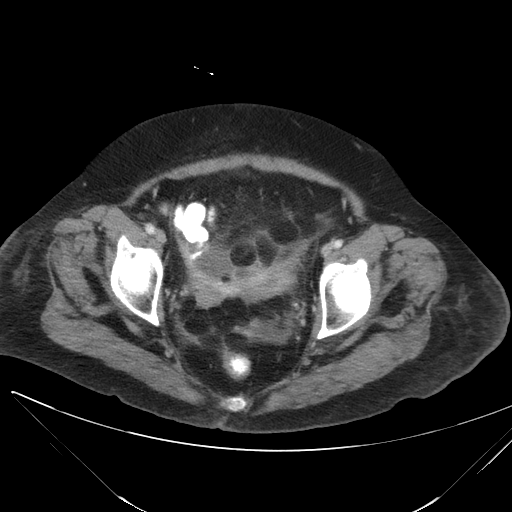
[im 36/98  soft-tissue]
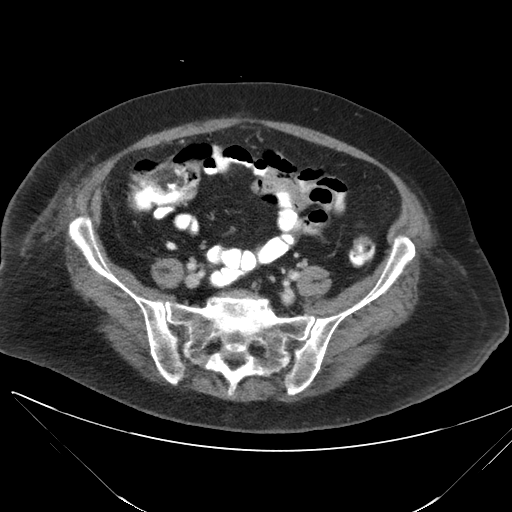
[im 52/98  soft-tissue]
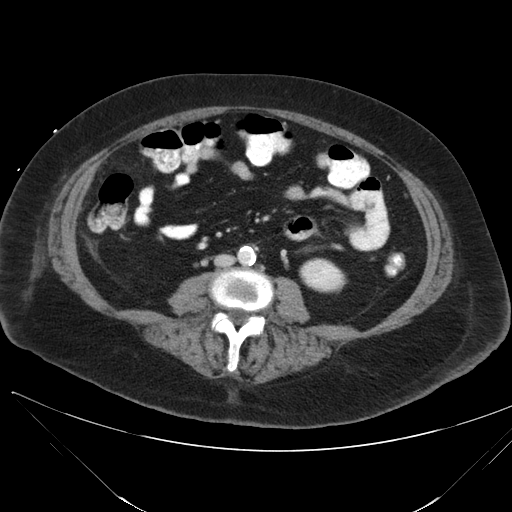
[im 62/98  soft-tissue]
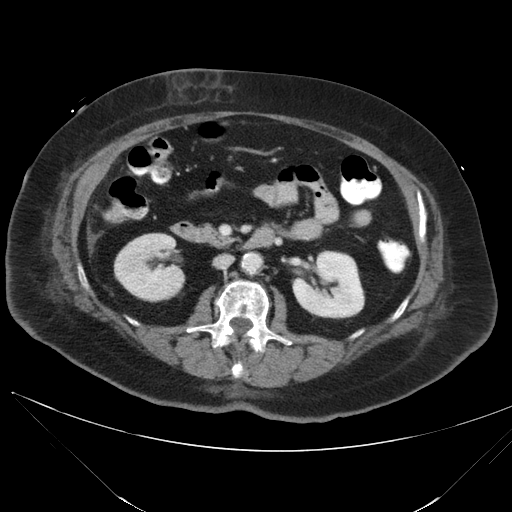
[im 77/98  soft-tissue]
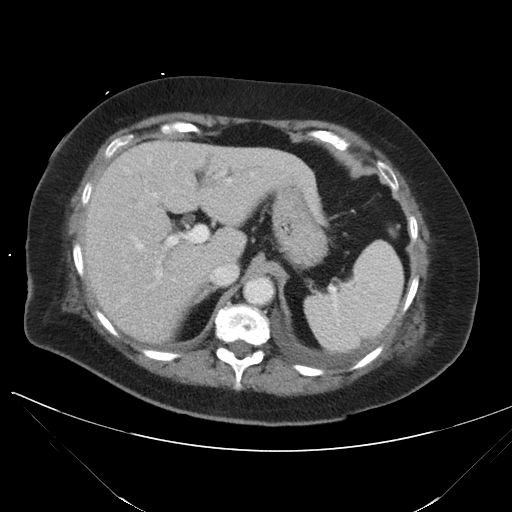
[im 87/98  soft-tissue]
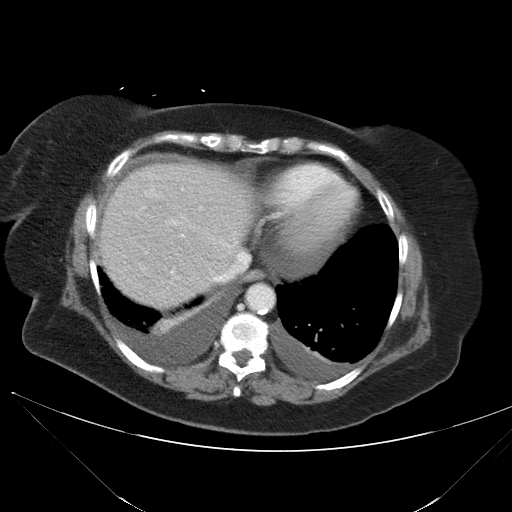

[Series 203: coronals, idose (2) · coronal · 0.45mm/px · 3 of 147 slices shown]
[im 49/147  soft-tissue]
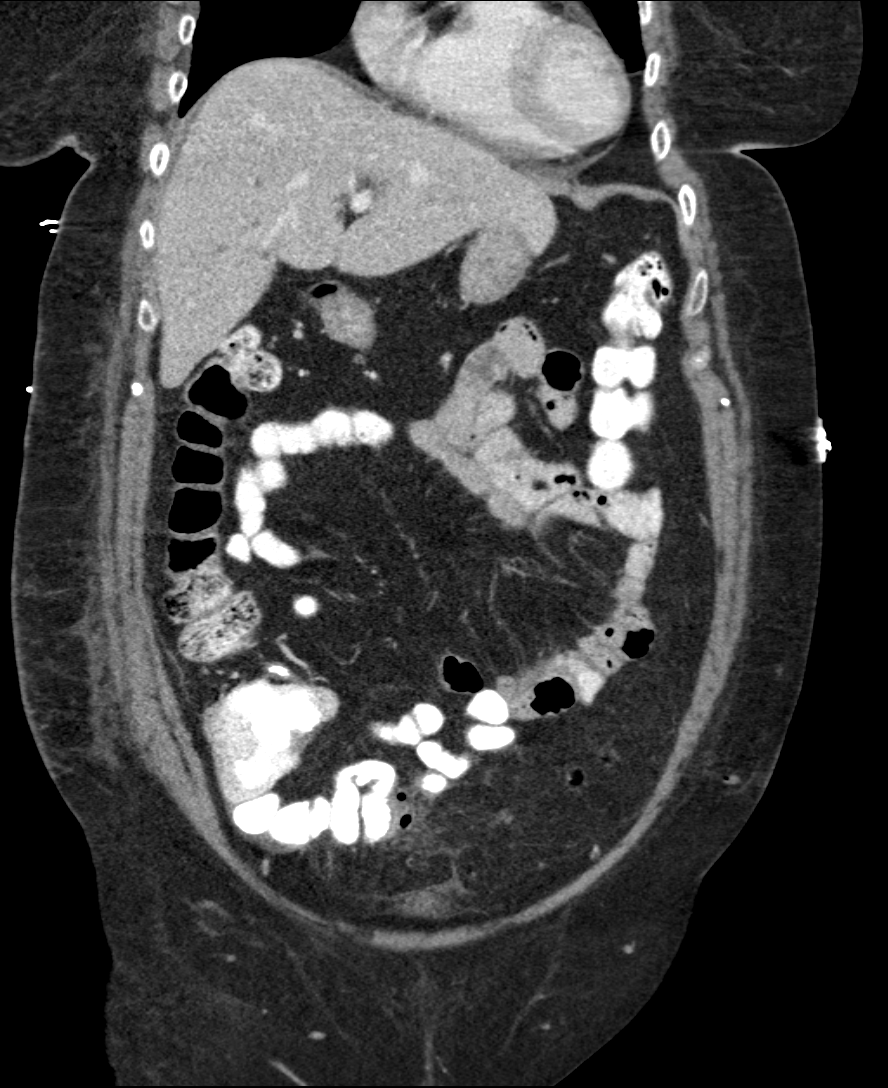
[im 65/147  soft-tissue]
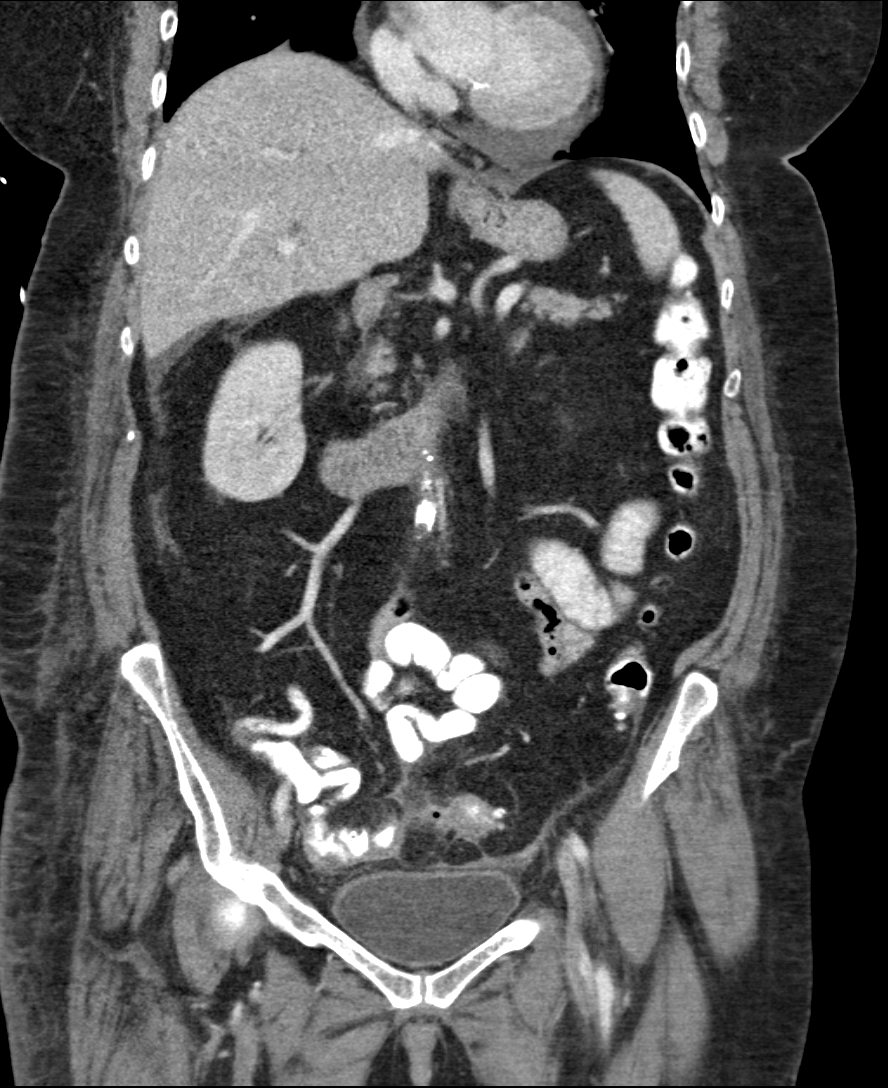
[im 82/147  soft-tissue]
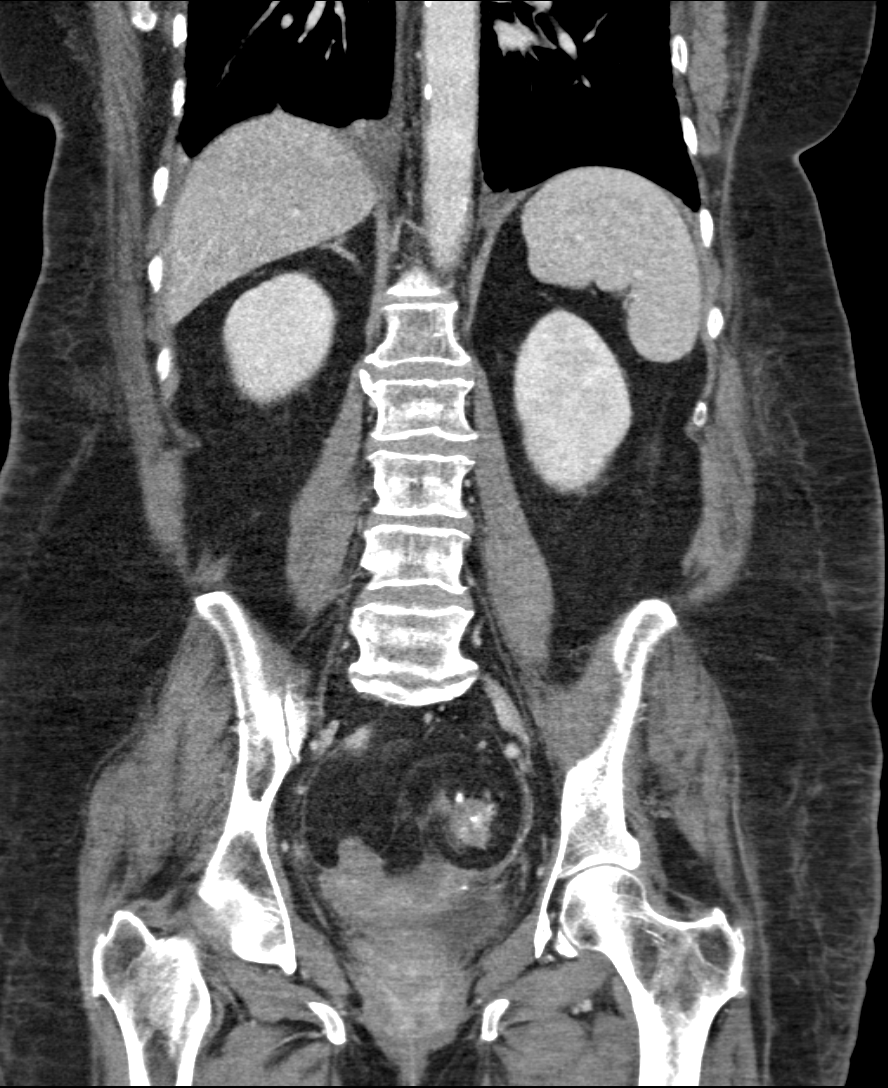

[10 of 46 positions shown; findings below may reference images not displayed]

FINDINGS: Lower chest: Mild bilateral pleural effusions are noted with
adjacent subsegmental atelectasis.

Hepatobiliary: Status post cholecystectomy. Stable intrahepatic
biliary dilatation is noted which most likely is related to post
cholecystectomy status. No focal abnormality is seen in the liver.

Pancreas: Unremarkable. No pancreatic ductal dilatation or
surrounding inflammatory changes.

Spleen: Normal in size without focal abnormality.

Adrenals/Urinary Tract: Adrenal glands are unremarkable. Kidneys are
normal, without renal calculi, focal lesion, or hydronephrosis.
Bladder is unremarkable.

Stomach/Bowel: Drainage catheter noted in para diverticular abscess
on prior exam has been removed. There is noted persistent fluid
collection with air-fluid level measuring 3.9 x 2.1 cm in this area,
consistent with persistent abscess and probable fistulous connection
to sigmoid colon lumen. Mild inflammatory changes and fluid remain
within the pelvis. There is no evidence of bowel obstruction. The
appendix appears normal.

Vascular/Lymphatic: Aortic atherosclerosis. No enlarged abdominal or
pelvic lymph nodes.

Reproductive: Status post hysterectomy. No adnexal masses.

Other: None.

Musculoskeletal: Mild degenerative disc disease is noted at L5-S1.
IMPRESSION: Drainage catheter noted in paradiverticular abscess on prior exam
has been removed. 3.9 x 2.1 cm persistent fluid collection with
air-fluid level remains, concerning for abscess and possible
fistulous connection to the sigmoid colon. Mild inflammatory changes
in fluid remain within the pelvis.

Aortic atherosclerosis.

Mild bilateral pleural effusions with adjacent subsegmental
atelectasis.

## 2017-07-24 NOTE — Telephone Encounter (Signed)
[facility] [employer].  RF request for potassium LOV: 08/10/17 Next ov: 07/11/17 Last written: 01/02/17 #30 w/ 6RF  Please advise. Thanks.

## 2017-07-25 IMAGING — CT CT IMAGE GUIDED FLUID DRAIN BY CATHETER
1 of 3 series · 12 of 32 positions shown, 18 images · non-contrast
Comparison: none

INDICATION: 70-year-old female with recurrent diverticular abscess.

[Series 2: i-spiral 5.0 b40f · axial · 0.92mm/px · z∈[+748,+878]mm · 12 of 45 slices shown, 18 images]
[im 4/45  soft-tissue]
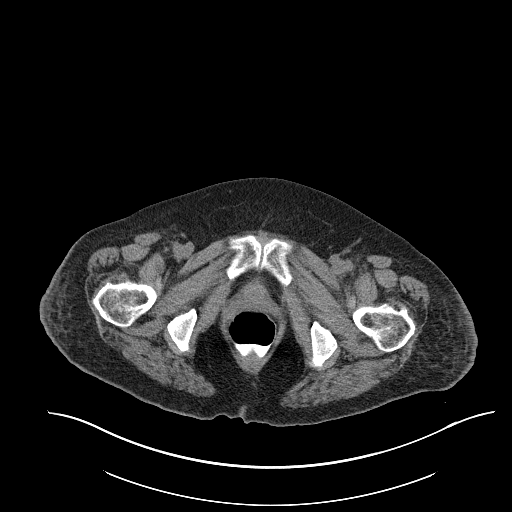
[im 4/45  bone]
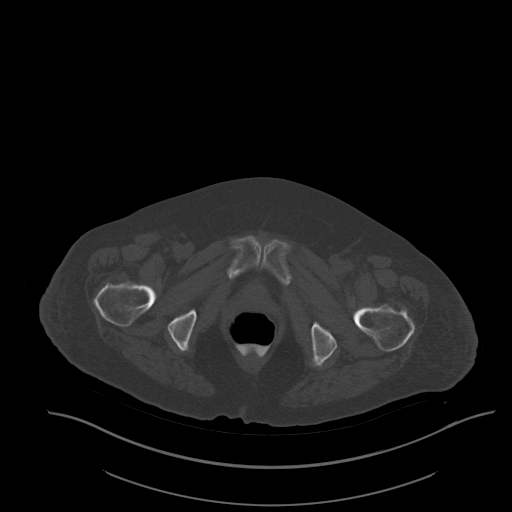
[im 7/45  soft-tissue]
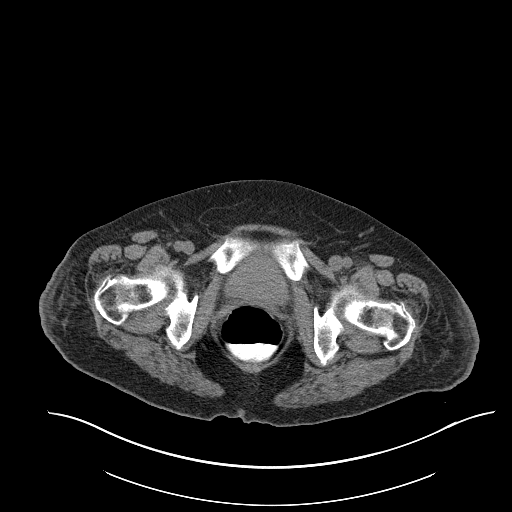
[im 11/45  soft-tissue]
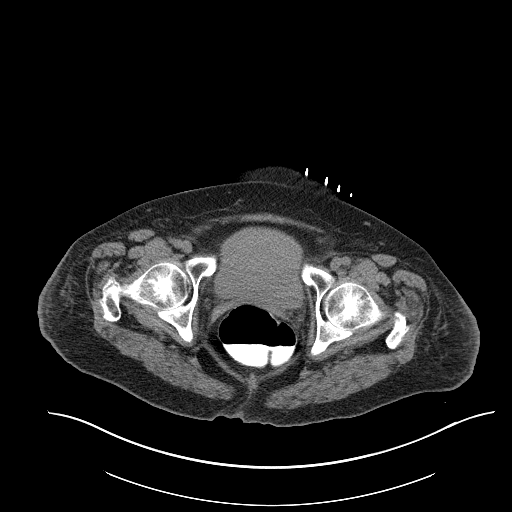
[im 14/45  soft-tissue]
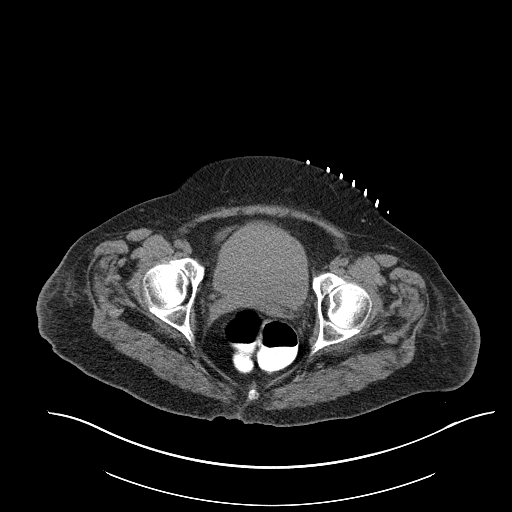
[im 17/45  soft-tissue]
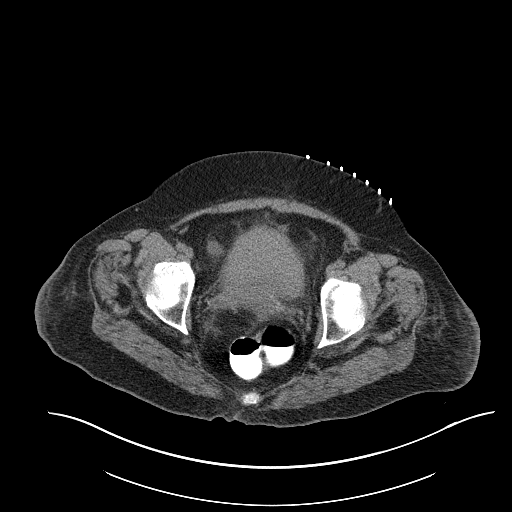
[im 21/45  soft-tissue]
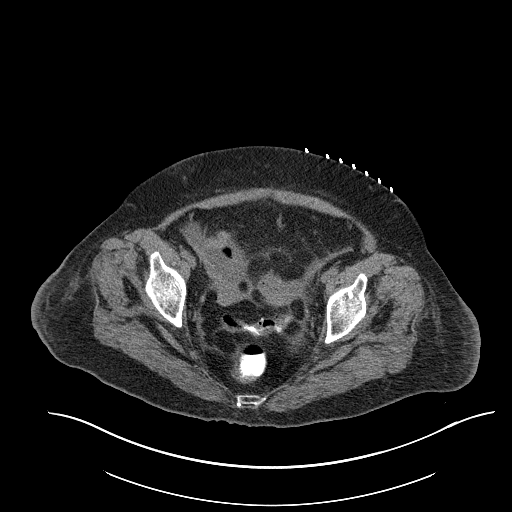
[im 24/45  soft-tissue]
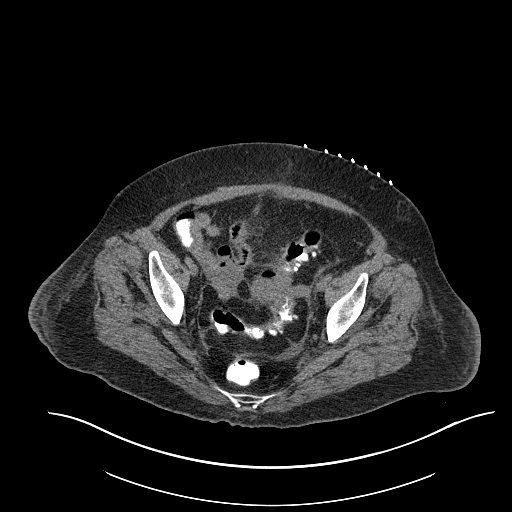
[im 28/45  soft-tissue]
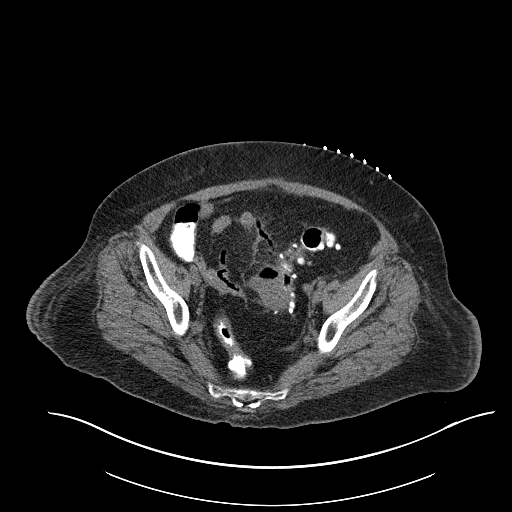
[im 31/45  soft-tissue]
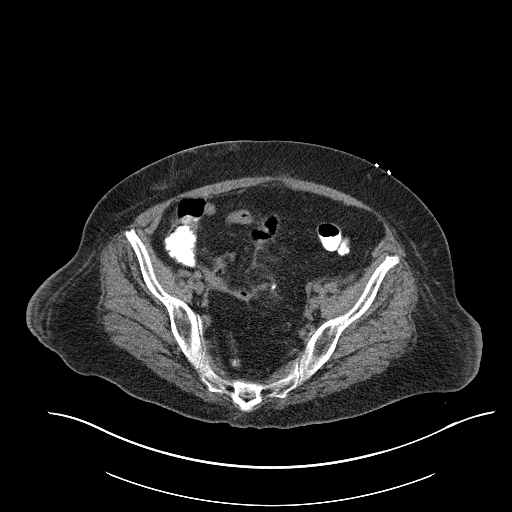
[im 31/45  lung]
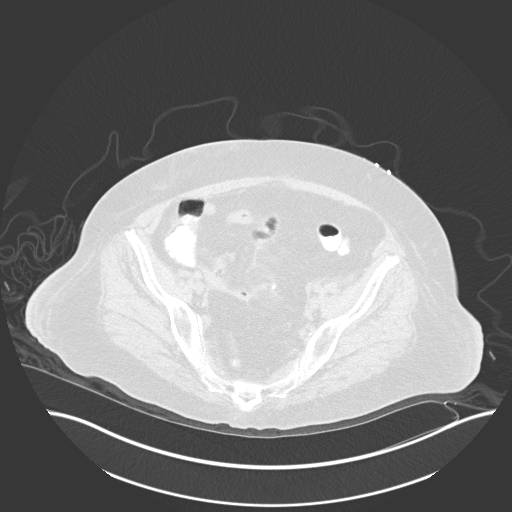
[im 31/45  bone]
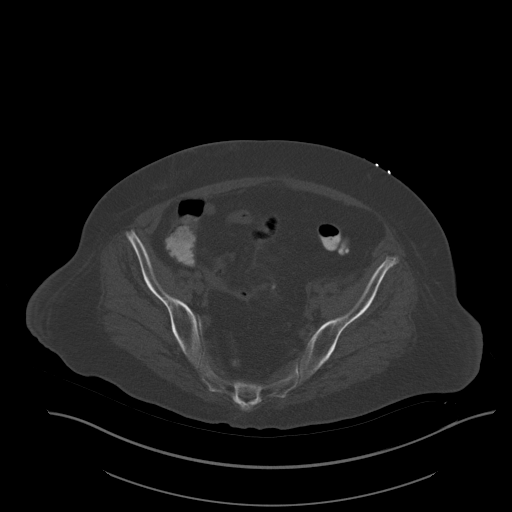
[im 34/45  soft-tissue]
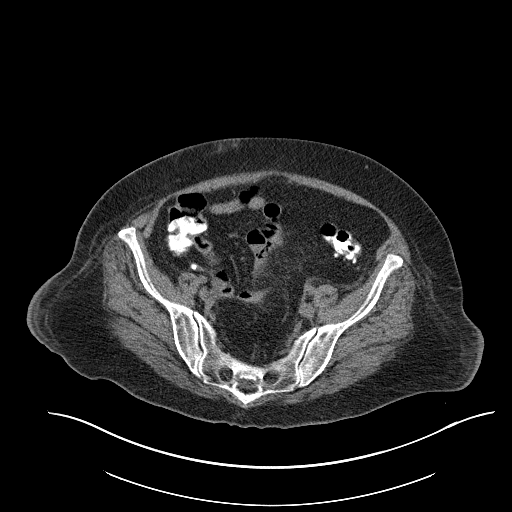
[im 34/45  lung]
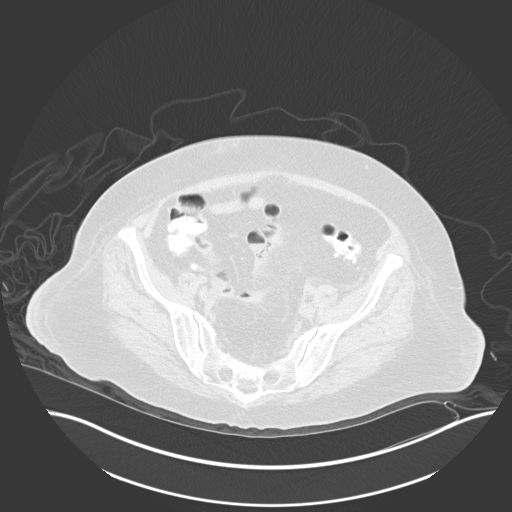
[im 38/45  soft-tissue]
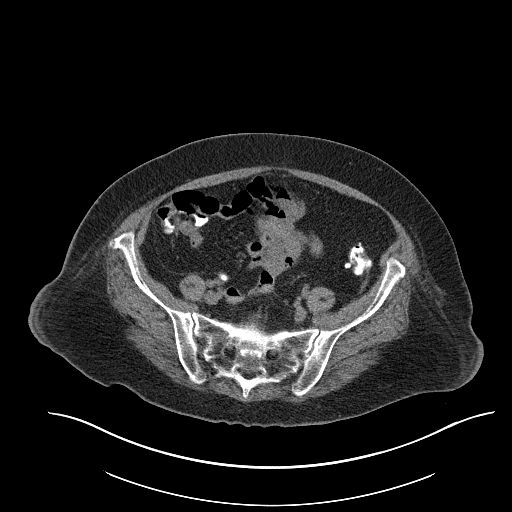
[im 38/45  lung]
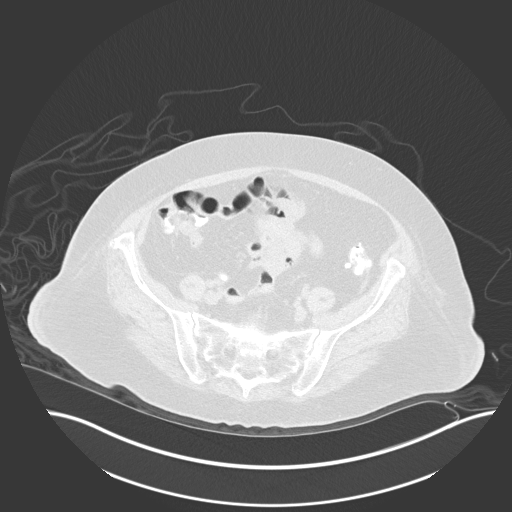
[im 41/45  soft-tissue]
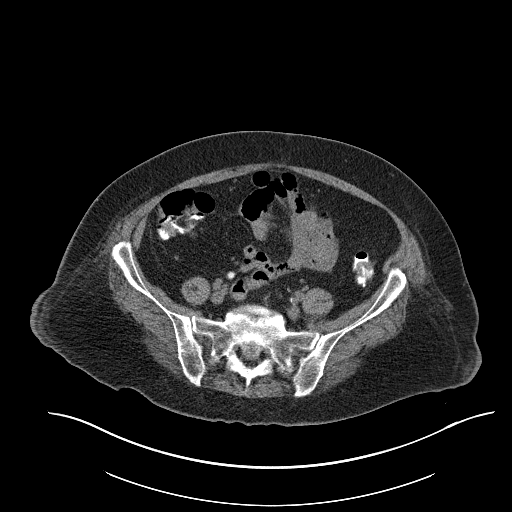
[im 41/45  lung]
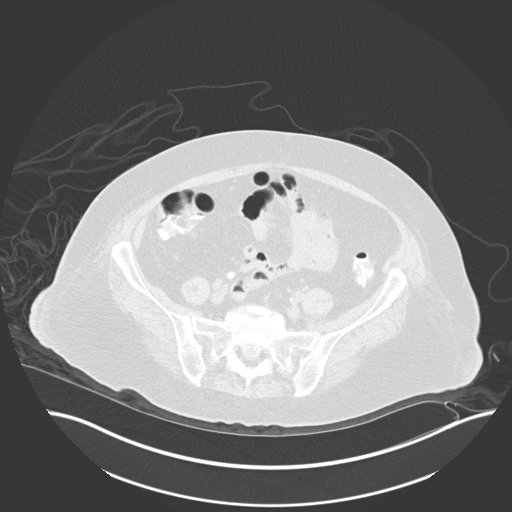

[12 of 32 positions shown; findings below may reference images not displayed]

EXAM:
CT IMAGE GUIDED FLUID DRAIN BY CATHETER

MEDICATIONS:
The patient is currently admitted to the hospital and receiving
intravenous antibiotics. The antibiotics were administered within an
appropriate time frame prior to the initiation of the procedure.

ANESTHESIA/SEDATION:
Fentanyl 100 mcg IV; Versed 2 mg IV

Moderate Sedation Time:  15 minutes

The patient was continuously monitored during the procedure by the
interventional radiology nurse under my direct supervision.

COMPLICATIONS:
None immediate.

PROCEDURE:
Informed written consent was obtained from the patient after a
thorough discussion of the procedural risks, benefits and
alternatives. All questions were addressed. Maximal Sterile Barrier
Technique was utilized including caps, mask, sterile gowns, sterile
gloves, sterile drape, hand hygiene and skin antiseptic. A timeout
was performed prior to the initiation of the procedure.

A planning axial CT scan was performed. The fluid and gas collection
adjacent to the sigmoid colon was successfully identified. A
suitable skin entry site was selected and marked. The region was
sterilely prepped and draped in standard fashion using chlorhexidine
skin prep. Local anesthesia was attained by infiltration with 1%
lidocaine. Under intermittent CT guidance, a 20 cm 18 gauge trocar
needle was advanced into the fluid and gas collection. Initial
aspiration yields frankly purulent material. Therefore, the decision
was made to proceed with drain placement. An Amplatz wire was
advanced through the needle and coiled within the fluid and gas
collection. The needle was removed and the skin tract was dilated to
10 French. Evgeny Tewari 10.2 French all-purpose drainage catheter was then
advanced over the wire and formed in the fluid collection.
Aspiration yields approximately 10 mL thick purulent material. A
sample was sent for culture.

The catheter was flushed and connected to JP bulb suction. The
catheter was then secured to the skin with 0 Prolene suture and an
adhesive fixation device.
IMPRESSION: Successful CT-guided drain placement into recurrent diverticular
abscess.

## 2017-07-25 IMAGING — CR DG CHEST 2V
2 series · 2 of 2 positions shown · non-contrast
Comparison: 09/14/2016

CLINICAL DATA: Cough and shortness of Breath

EXAM:
CHEST  2 VIEW

[chest lat]
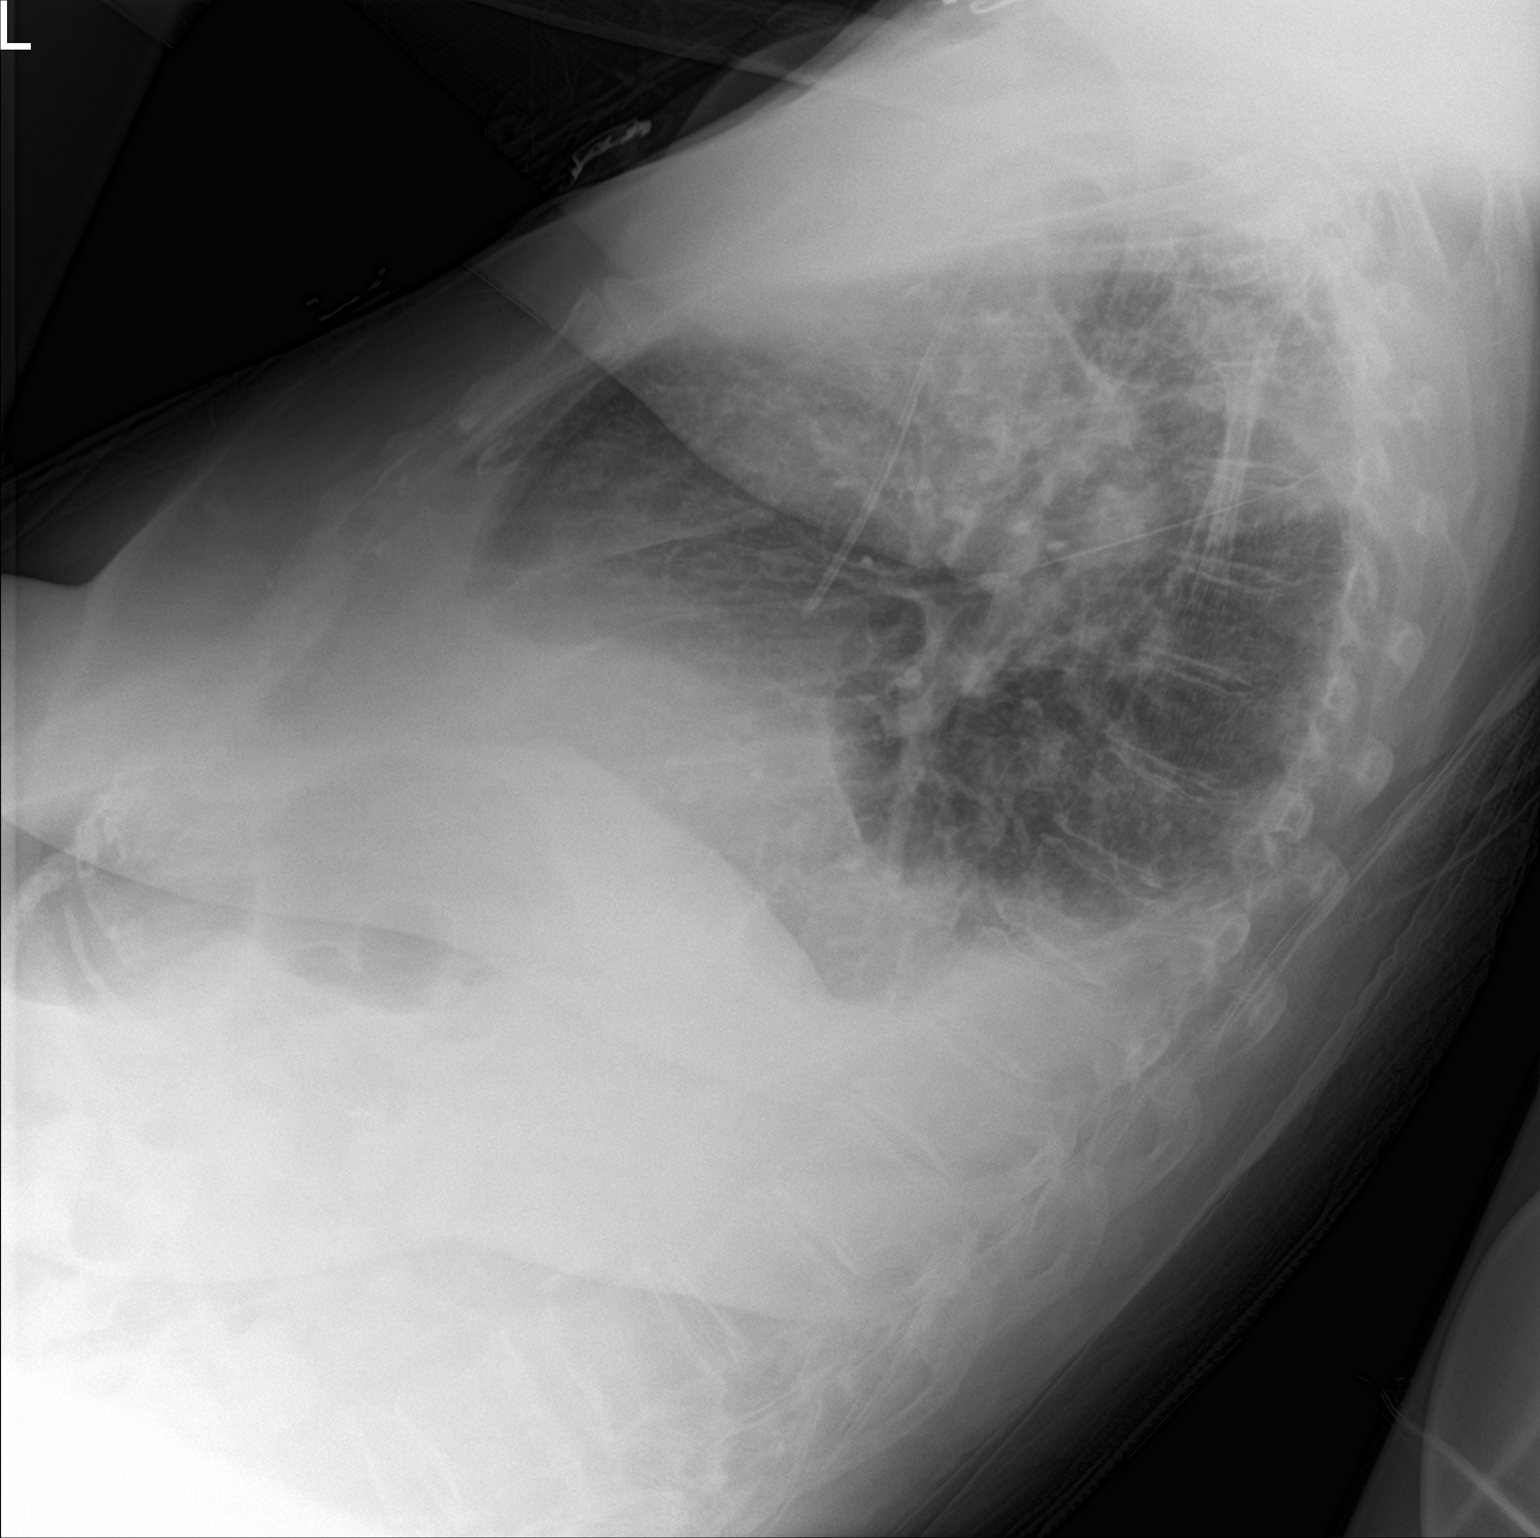

[chest ap]
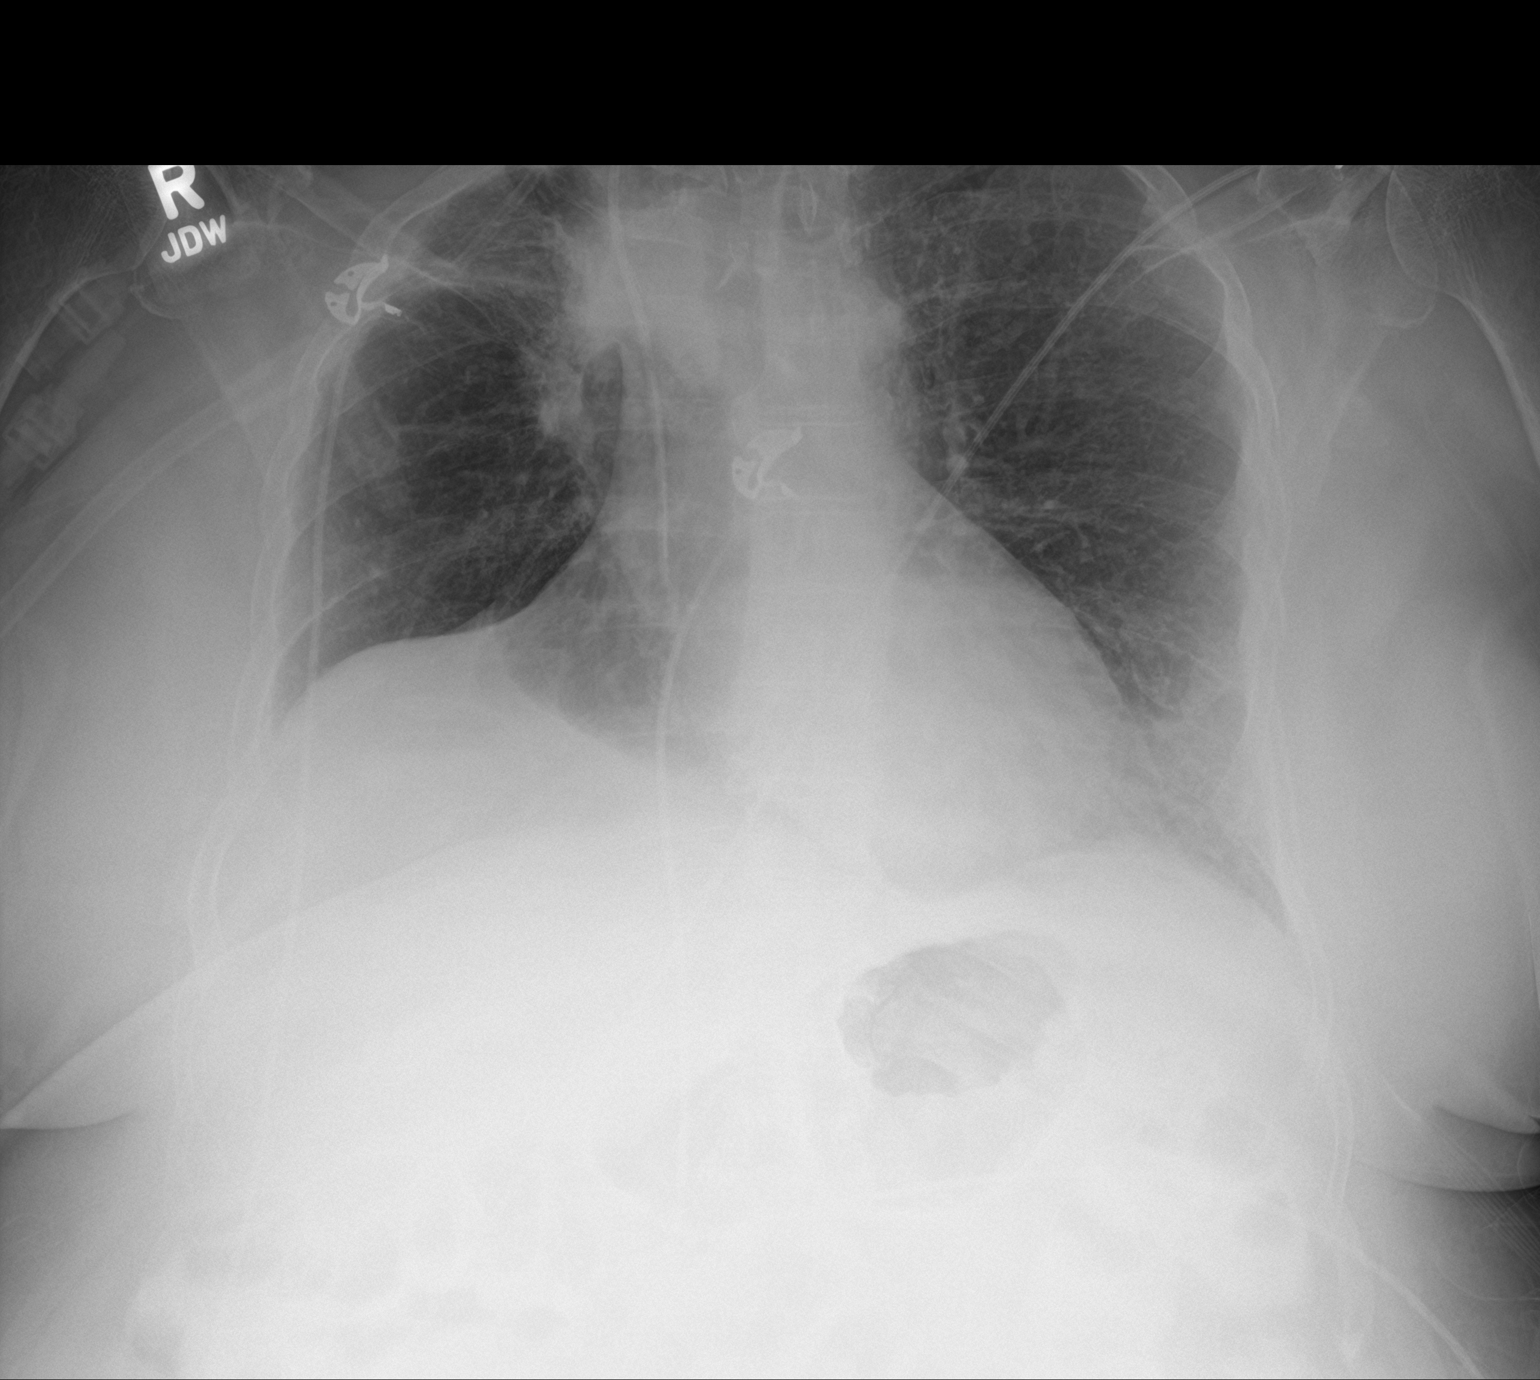

[2 of 2 positions shown; findings below may reference images not displayed]

FINDINGS: Cardiac shadow is stable. Endotracheal tube and nasogastric catheter
is well as a left jugular catheter have been removed. Right jugular
catheter remains. Bilateral small pleural effusions are noted.
Likely some underlying atelectasis is present as well. There is
persistent right peritracheal density consistent with consolidation
similar to that seen on the prior exam. No acute bony abnormality is
seen.
IMPRESSION: Persistent consolidation in the right upper lobe.

Bilateral pleural effusions.

## 2017-07-26 IMAGING — CR DG CHEST 1V PORT
1 series · 1 of 1 positions shown · non-contrast
Comparison: 09/21/2016 .  CT 09/14/2016.

CLINICAL DATA: Shortness of breath.

EXAM:
PORTABLE CHEST 1 VIEW

[AP]
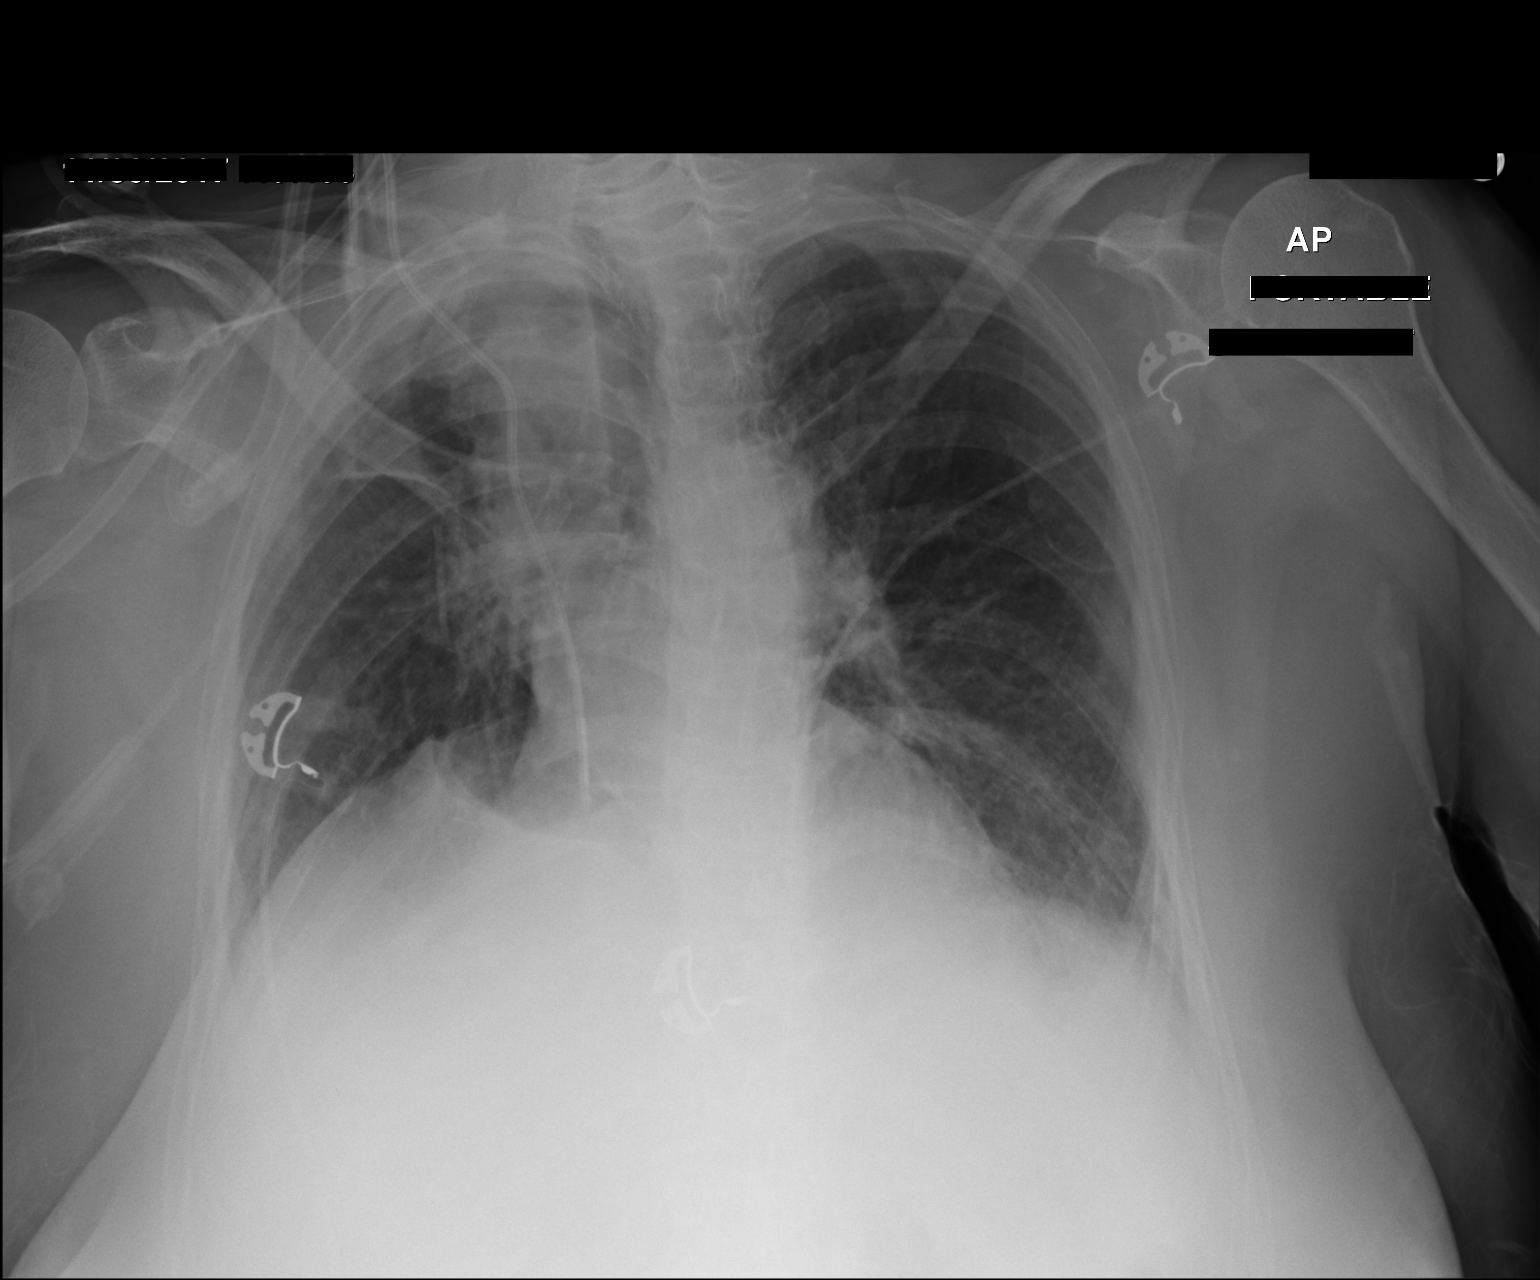

[1 of 1 positions shown; findings below may reference images not displayed]

FINDINGS: Right IJ line in stable position. Persistent right upper lobe mass/
infiltrate and probable post treatment changes noted. Low lung
volumes with mild basilar atelectasis. No prominent pleural
effusion. No pneumothorax. Heart size normal. No acute bony
abnormality .
IMPRESSION: 1.  Right IJ line stable position.

2. Persistent right upper lobe mass/infiltrate and probable post
treatment changes. No significant change. Low lung volumes. Chest is
stable from prior exam .

## 2017-07-26 IMAGING — CT CT CHEST W/O CM
2 of 3 series · 15 of 36 positions shown, 18 images · non-contrast
Comparison: 09/14/2016

CLINICAL DATA: Continued shortness of breath and coughing with
chest pain for several days. History of lung cancer

EXAM:
CT CHEST WITHOUT CONTRAST
TECHNIQUE: Multidetector CT imaging of the chest was performed following the
standard protocol without IV contrast.

[Series 2: chest w/o 2mm st · axial · non-contrast · 0.61mm/px · z∈[-746,-534]mm · 12 of 126 slices shown, 15 images]
[im 10/126  mediastinal]
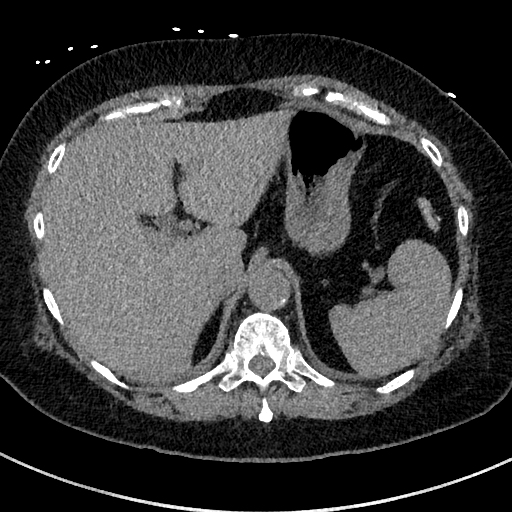
[im 10/126  lung]
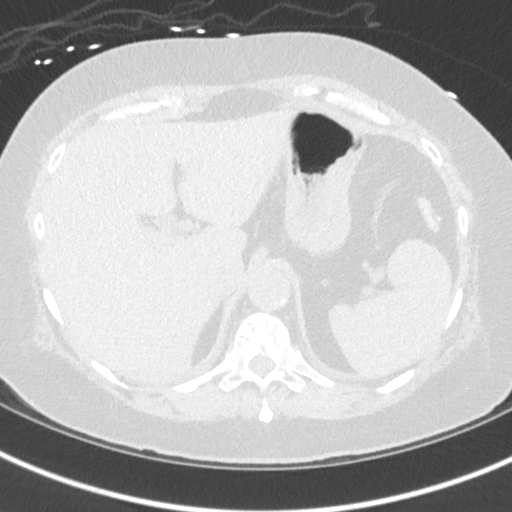
[im 19/126  lung]
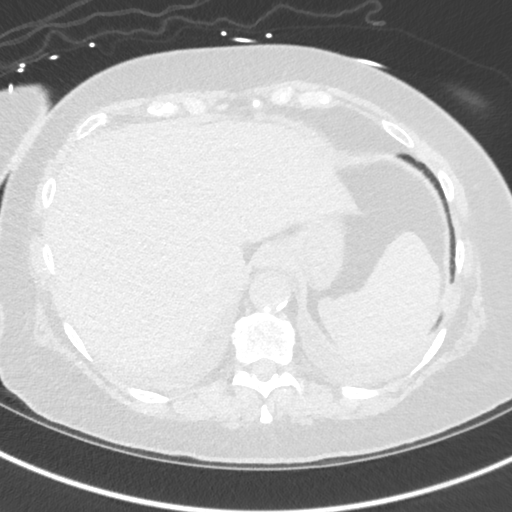
[im 28/126  lung]
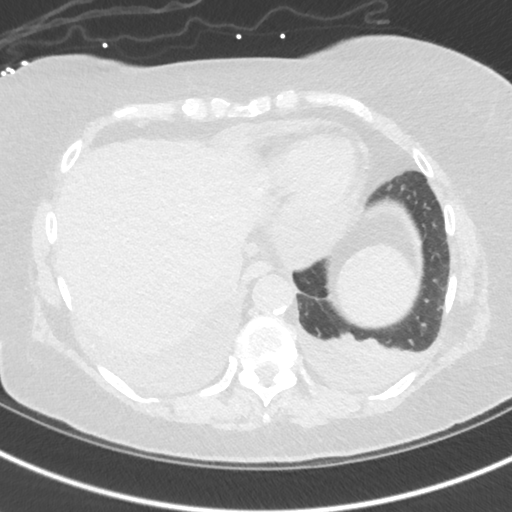
[im 38/126  lung]
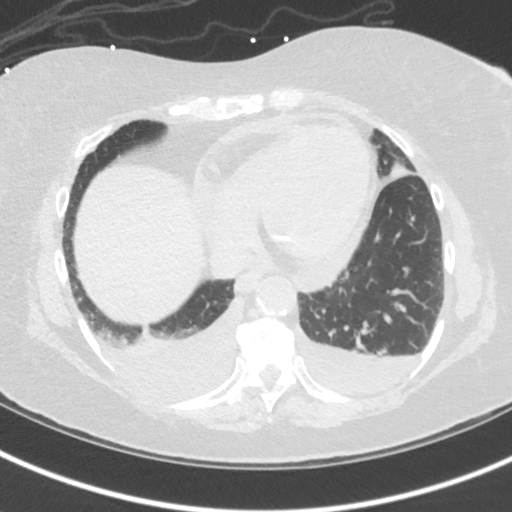
[im 47/126  mediastinal]
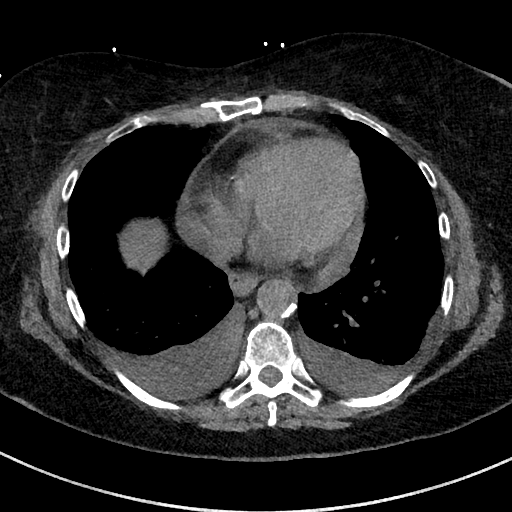
[im 47/126  lung]
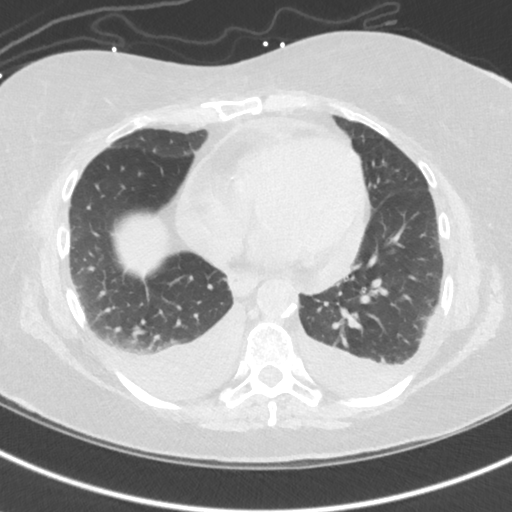
[im 56/126  lung]
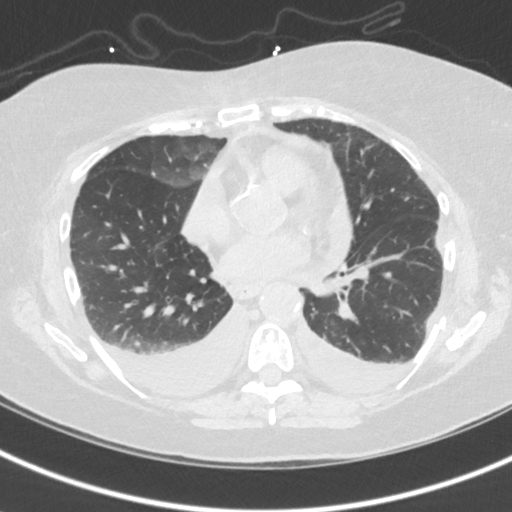
[im 70/126  lung]
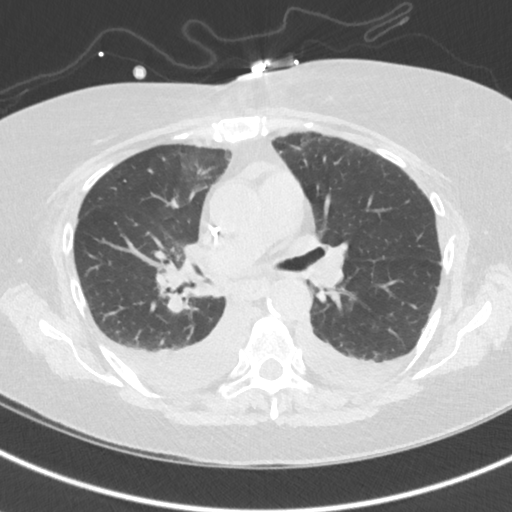
[im 79/126  lung]
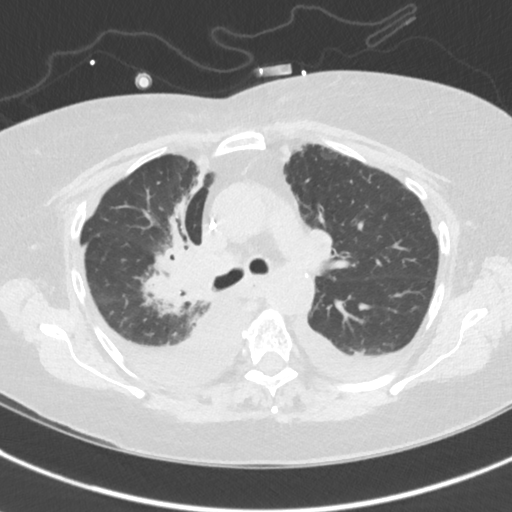
[im 88/126  mediastinal]
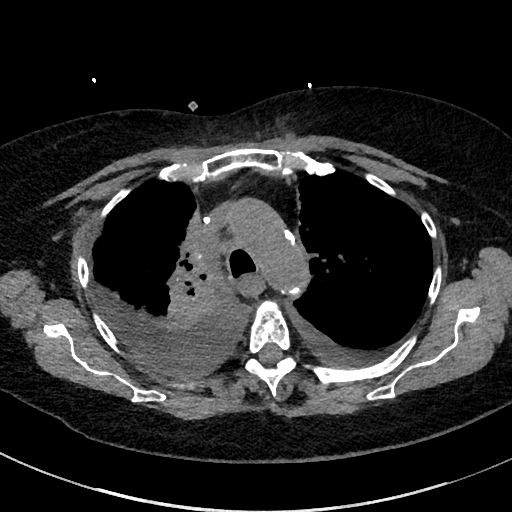
[im 88/126  lung]
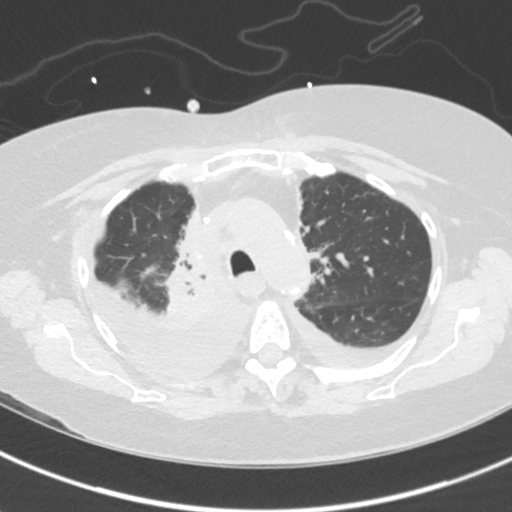
[im 98/126  lung]
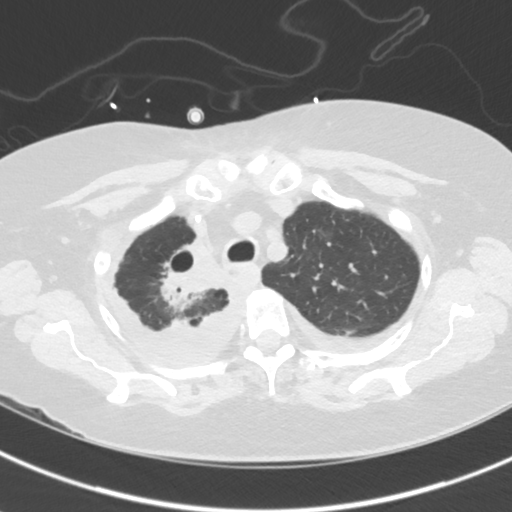
[im 107/126  lung]
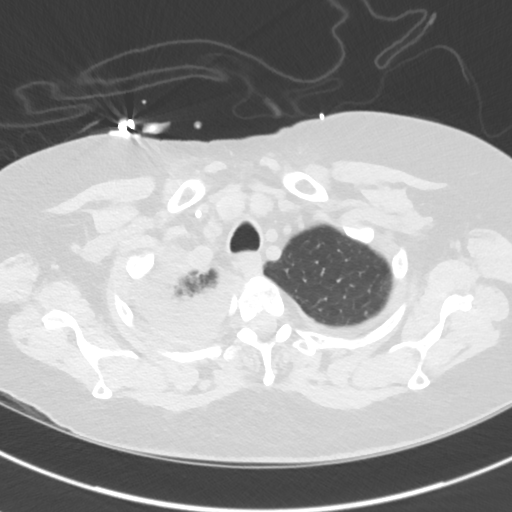
[im 116/126  lung]
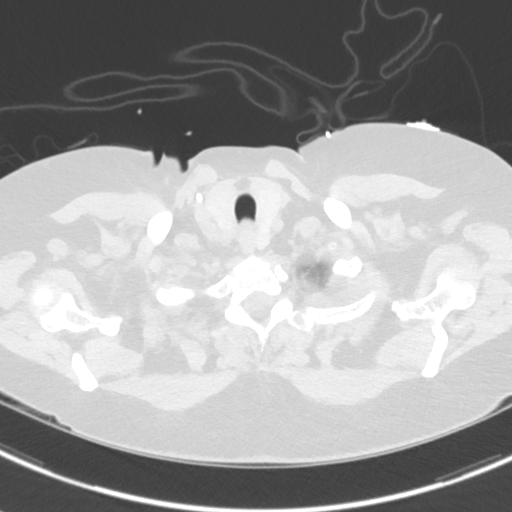

[Series 5: chest w/o 3mm st cor · coronal · non-contrast · 0.52mm/px · 3 of 67 slices shown]
[im 14/67  lung]
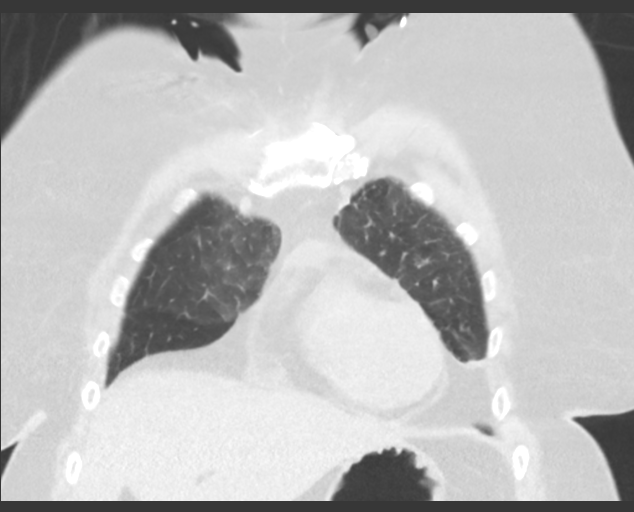
[im 27/67  lung]
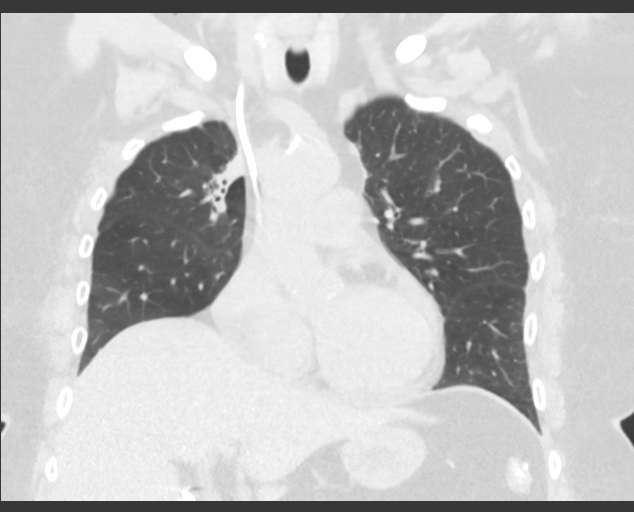
[im 40/67  lung]
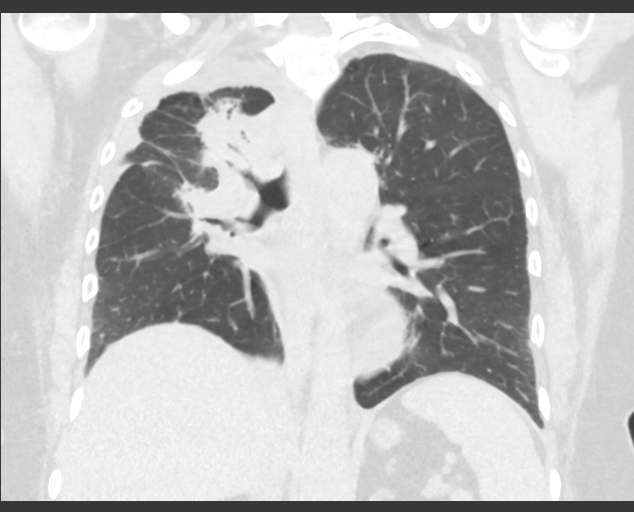

[15 of 36 positions shown; findings below may reference images not displayed]

FINDINGS: Cardiovascular: Slight increase in the small pericardial effusion.
Coronary artery calcification is noted. Atherosclerotic
calcification is noted in the wall of the thoracic aorta.
Right-sided central line tip is positioned in the distal SVC.

Mediastinum/Nodes: Persistent abnormal soft tissue attenuation
identified in the right hilum and right suprahilar region. The
esophagus has normal imaging features. There is no axillary
lymphadenopathy.

Lungs/Pleura: Airspace consolidation in the medial right upper lung
and apex is not substantially changed interval, presumably
representing post radiation fibrosis. The asymmetric, right greater
than left ground-glass attenuation seen previously has improved in
the interval as has the interlobular septal thickening. Small to
moderate right pleural effusion and small left pleural effusion are
slightly progressed in the interval.

Upper Abdomen: Unremarkable.

Musculoskeletal: Bone windows reveal no worrisome lytic or sclerotic
osseous lesions. Apparent nondisplaced upper sternal fracture
unchanged.
IMPRESSION: 1. Post treatment changes medial right upper lobe involving the
right hilum and apex are not substantially changed in the interval.
2. Scattered ground-glass attenuation with interlobular septal
thickening seen previously has decreased in the interval.
3. Slight progression of bilateral pleural effusions.

## 2017-07-28 DIAGNOSIS — S42291D Other displaced fracture of upper end of right humerus, subsequent encounter for fracture with routine healing: Secondary | ICD-10-CM | POA: Diagnosis not present

## 2017-07-29 IMAGING — CT CT HEAD W/O CM
4 series · 16 of 47 positions shown, 18 images · non-contrast
Comparison: 06/14/2016.

CLINICAL DATA: Patient complains of LEFT-sided weakness. History of
diabetes. Mild cognitive impairment. History of brain radiation.
Recent drain placement into a diverticular abscess.

EXAM:
CT HEAD WITHOUT CONTRAST
TECHNIQUE: Contiguous axial images were obtained from the base of the skull
through the vertex without intravenous contrast.

[Series 2: head without · axial · non-contrast · 0.39mm/px · z∈[-99,+21]mm · 7 of 33 slices shown, 9 images]
[im 5/33  brain]
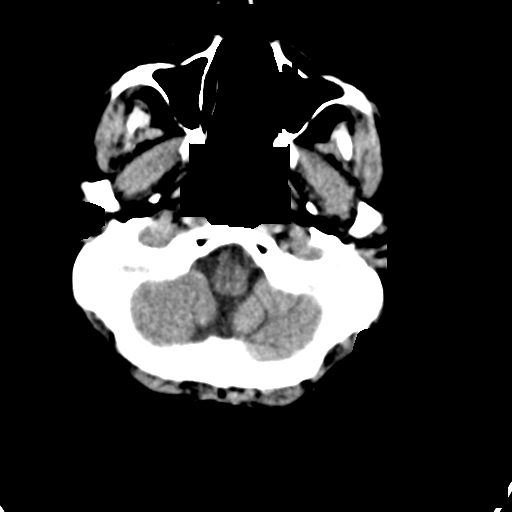
[im 5/33  bone]
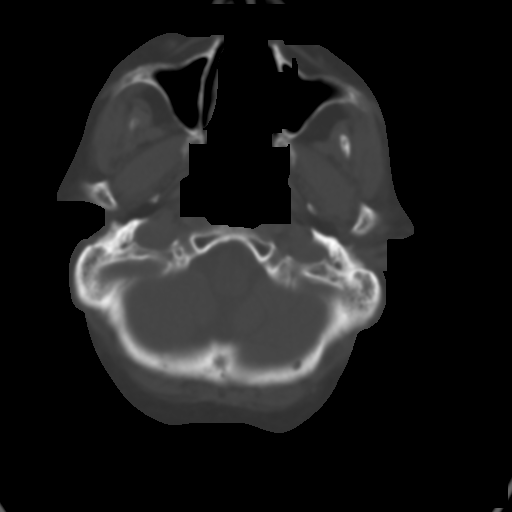
[im 9/33  brain]
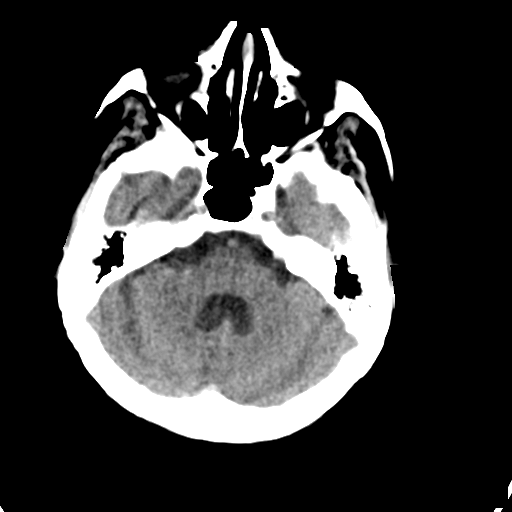
[im 13/33  brain]
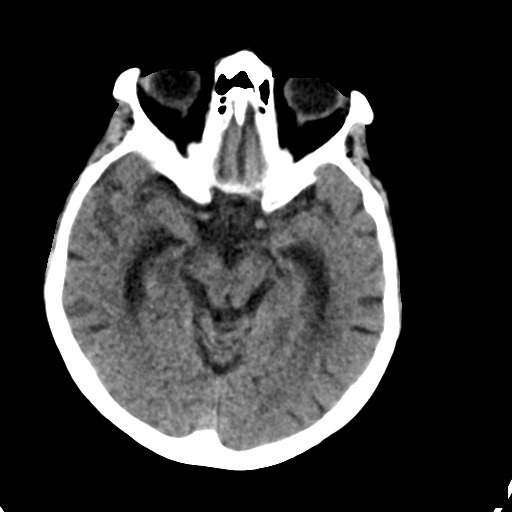
[im 17/33  brain]
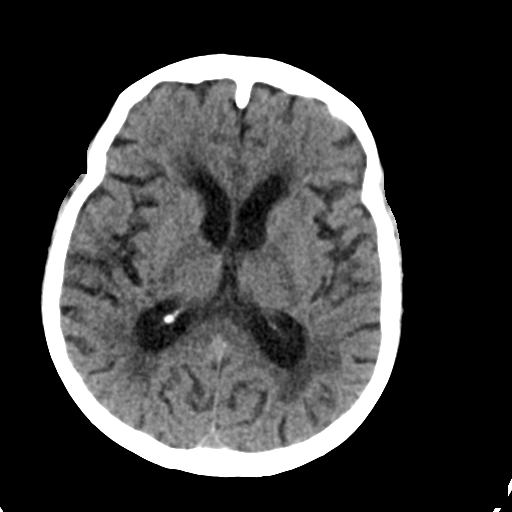
[im 21/33  brain]
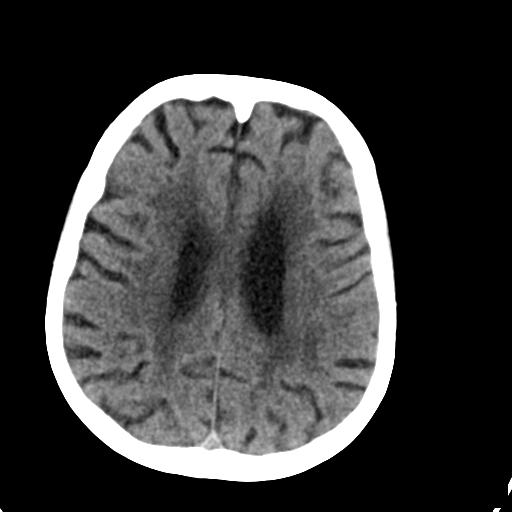
[im 21/33  bone]
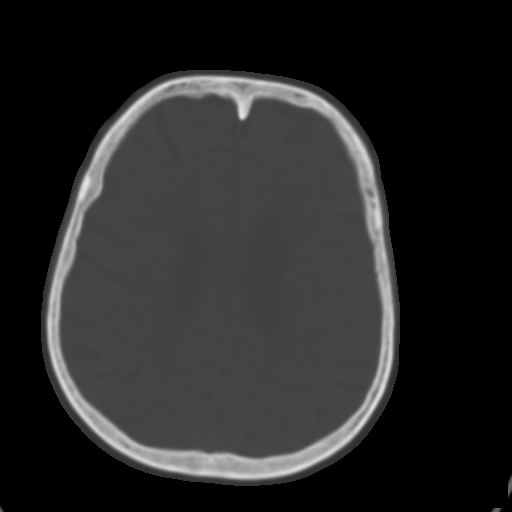
[im 25/33  brain]
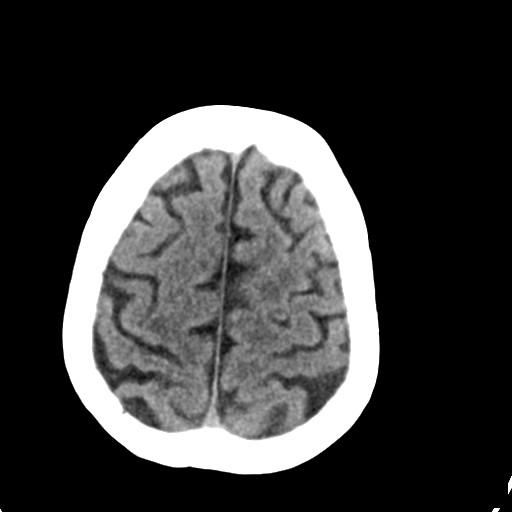
[im 29/33  brain]
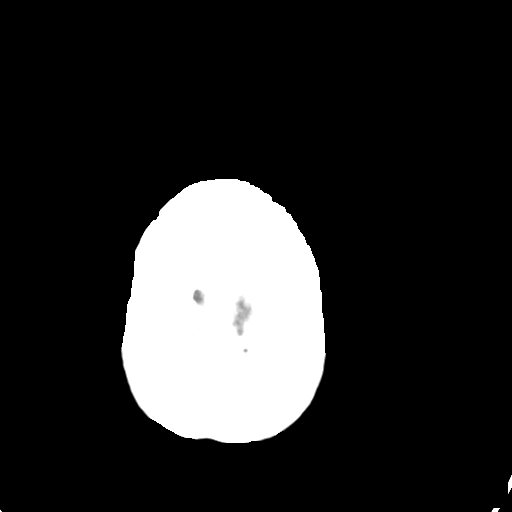

[Series 3: head bone · axial · 0.39mm/px · z∈[-103,-71]mm · 3 of 81 slices shown]
[im 9/81  bone]
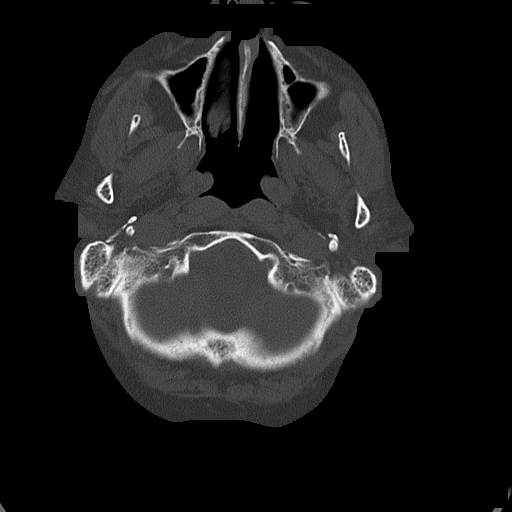
[im 17/81  bone]
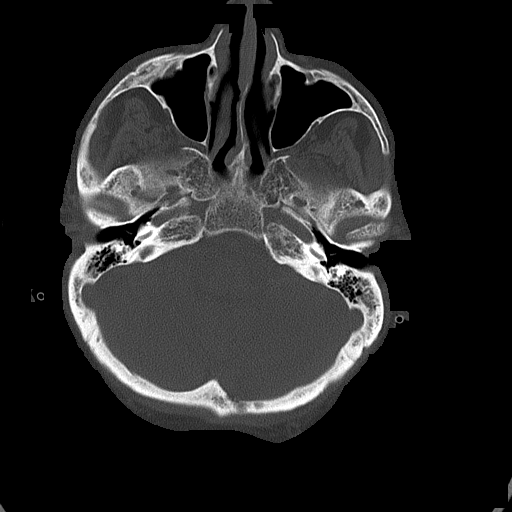
[im 25/81  bone]
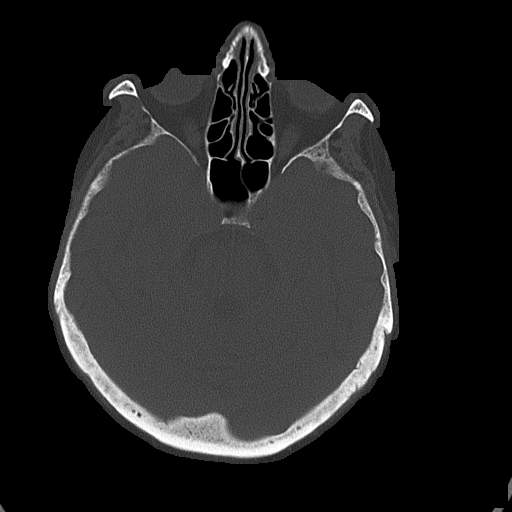

[Series 4: head without cor · coronal · non-contrast · 0.29mm/px · 3 of 67 slices shown]
[im 23/67  brain]
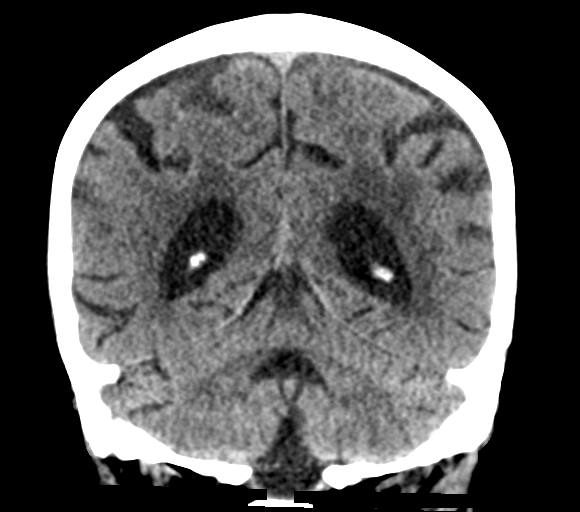
[im 30/67  brain]
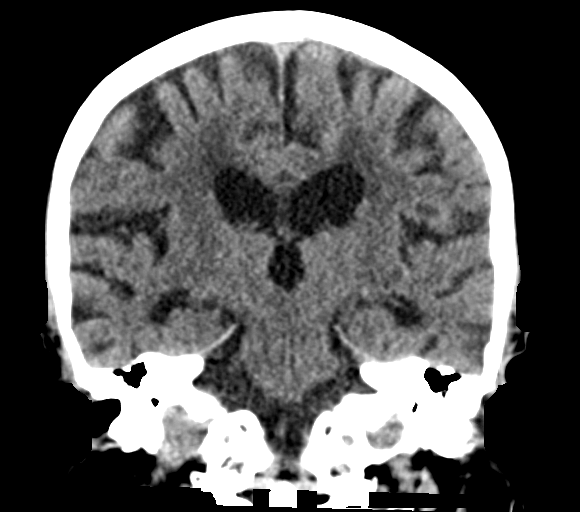
[im 37/67  brain]
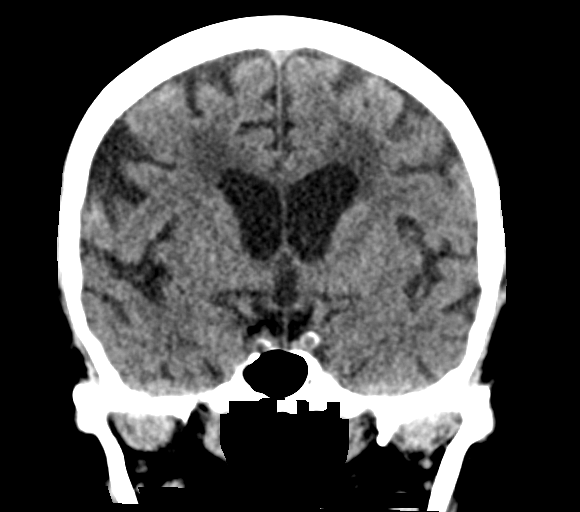

[Series 5: head without sag · sagittal · non-contrast · 0.32mm/px · 3 of 55 slices shown]
[im 19/55  brain]
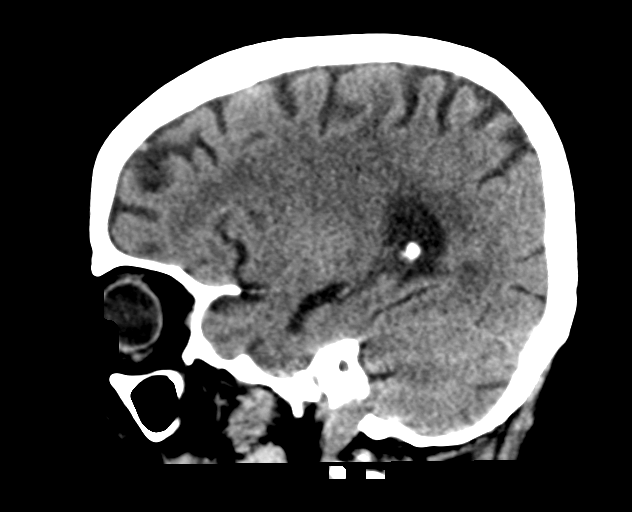
[im 28/55  brain]
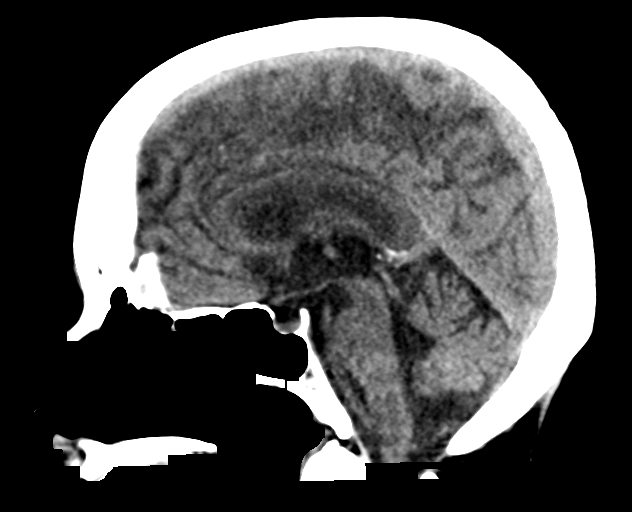
[im 37/55  brain]
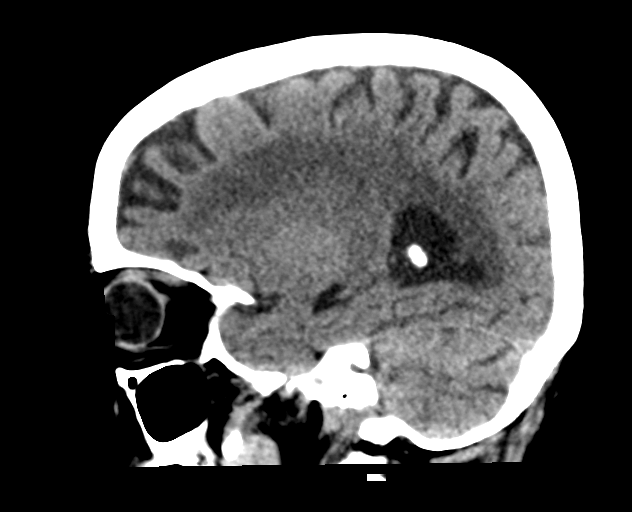

[16 of 47 positions shown; findings below may reference images not displayed]

FINDINGS: Brain: No evidence for acute infarction, hemorrhage, mass lesion,
hydrocephalus, or extra-axial fluid. Moderate cerebral and
cerebellar atrophy. Extensive hypoattenuation of white matter likely
representing a combination of post treatment effect and small vessel
disease. No areas of vasogenic edema.

Vascular: No hyperdense vessel or unexpected calcification.

Skull: Normal. Negative for fracture or focal lesion.

Sinuses/Orbits: No acute finding.

Other: None.
IMPRESSION: Atrophy and small vessel disease.  No acute intracranial findings.

## 2017-07-29 IMAGING — MR MR HEAD W/O CM
9 of 10 series · 36 of 48 positions shown · non-contrast
Comparison: Head CT from earlier today.

CLINICAL DATA: Left arm numbness

EXAM:
MRI HEAD WITHOUT CONTRAST
TECHNIQUE: Multiplanar, multiecho pulse sequences of the brain and surrounding
structures were obtained without intravenous contrast.

[Series 4: DWI · axial · 3.0mm · 1.09mm/px · z∈[-21,+120]mm · 8 of 95 slices shown (1 of 4)]
[im 1/95]
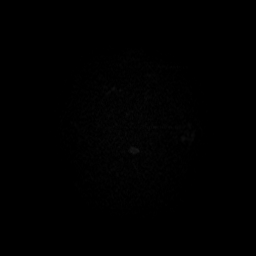
[im 11/95]
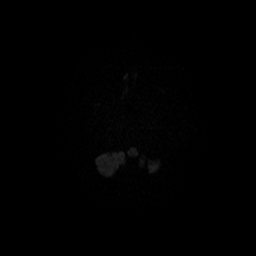
[im 32/95]
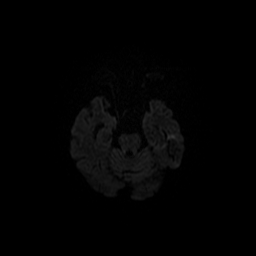
[im 42/95]
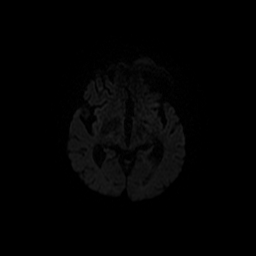
[im 53/95]
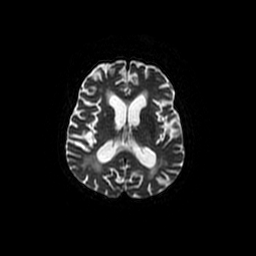
[im 63/95]
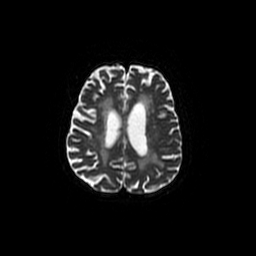
[im 84/95]
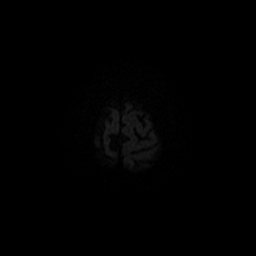
[im 95/95]
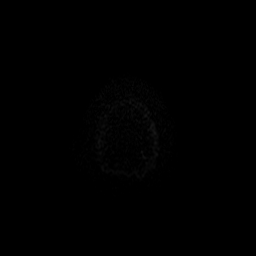

[Series 5: DWI · coronal · 5.0mm · 1.09mm/px · 7 of 72 slices shown (2 of 4)]
[im 1/72]
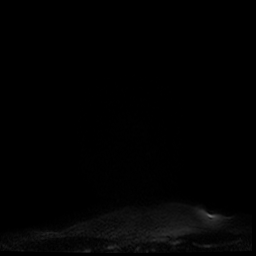
[im 12/72]
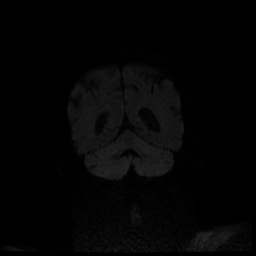
[im 24/72]
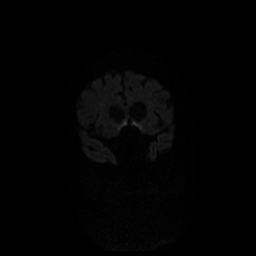
[im 36/72]
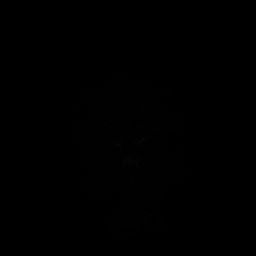
[im 48/72]
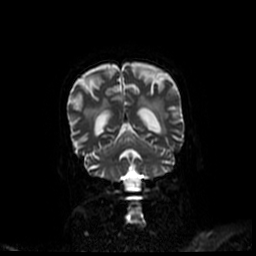
[im 60/72]
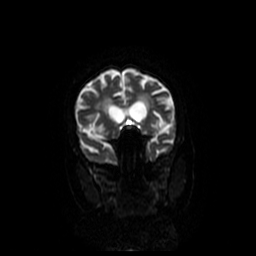
[im 72/72]
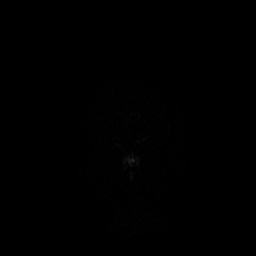

[Series 6: T1 · sagittal · 5.0mm · 0.47mm/px · 3 of 25 slices shown]
[im 1/25]
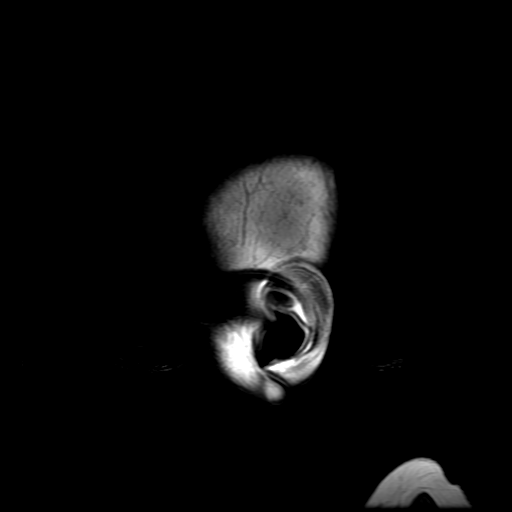
[im 13/25]
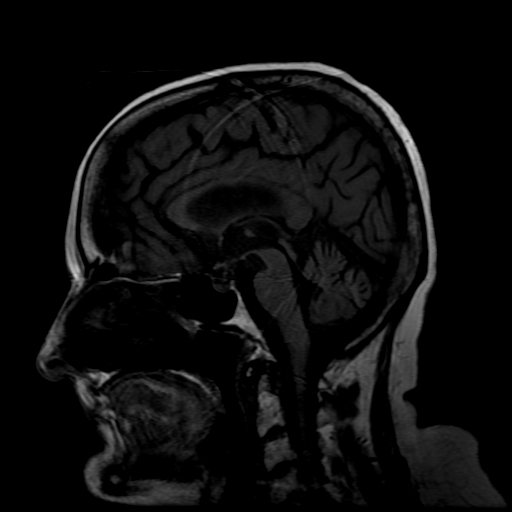
[im 25/25]
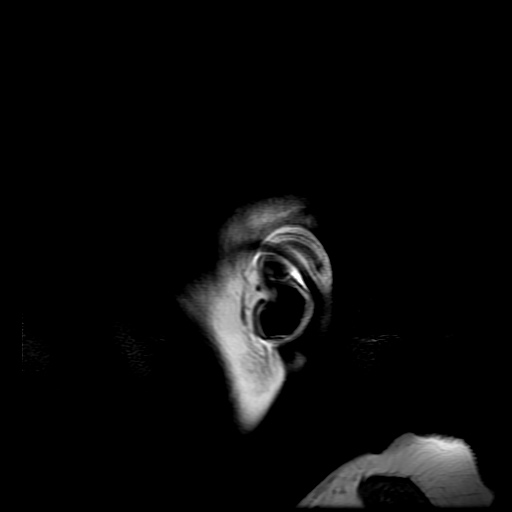

[Series 7: T2 · axial · 5.0mm · 0.43mm/px · z∈[-19,+119]mm · 2 of 24 slices shown]
[im 1/24]
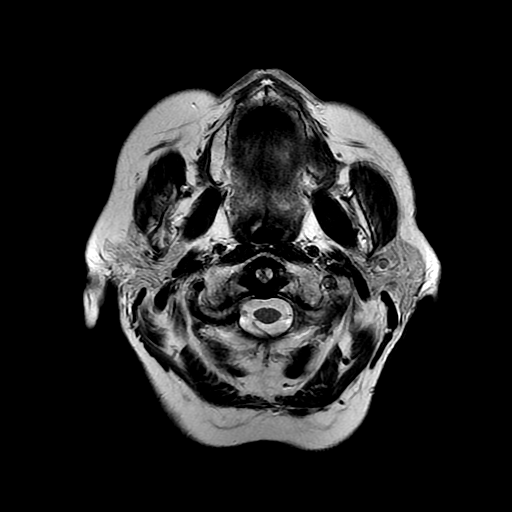
[im 24/24]
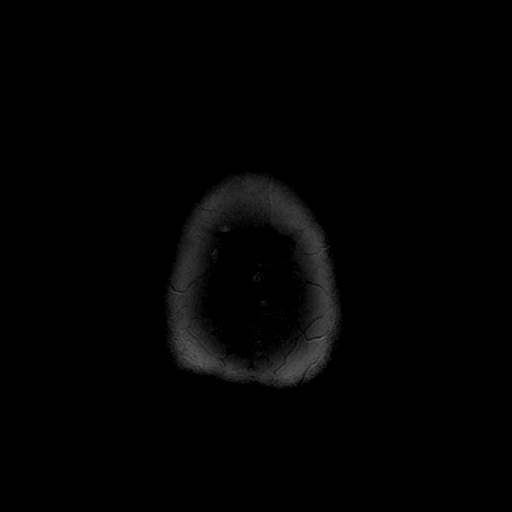

[Series 8: FLAIR · axial · 5.0mm · 0.43mm/px · z∈[-19,+119]mm · 2 of 24 slices shown]
[im 1/24]
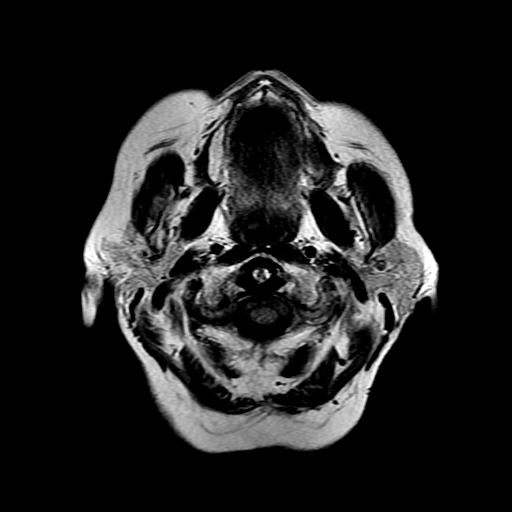
[im 24/24]
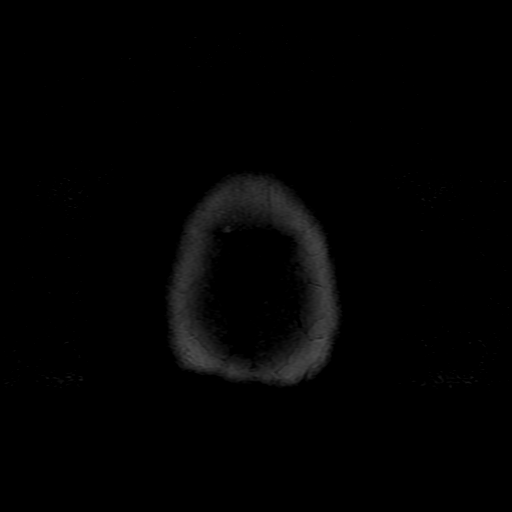

[Series 9: ax mpgr · axial · 5.0mm · 0.43mm/px · z∈[-19,+119]mm · 2 of 24 slices shown]
[im 1/24]
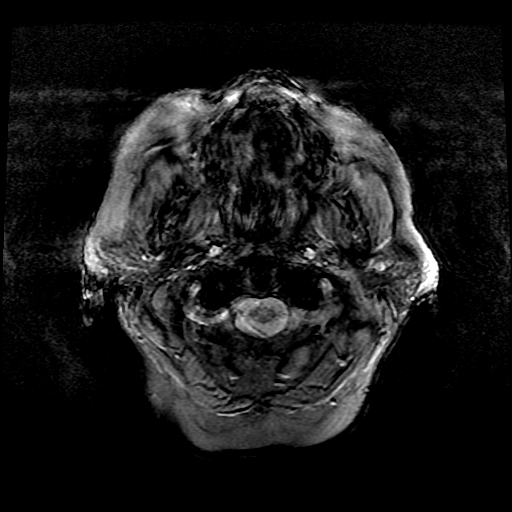
[im 24/24]
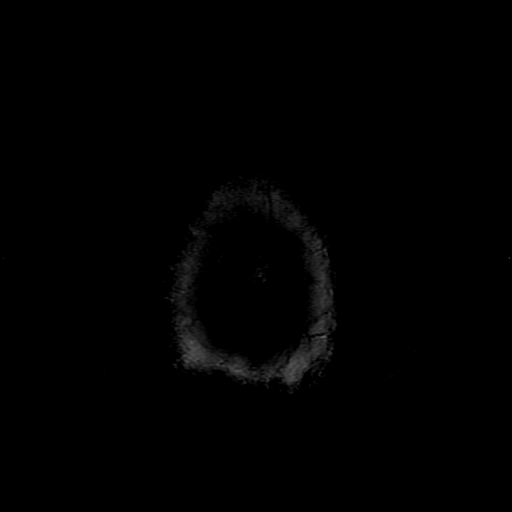

[Series 11: T2 post-contrast · coronal · 5.0mm · 0.39mm/px · 3 of 28 slices shown]
[im 1/28]
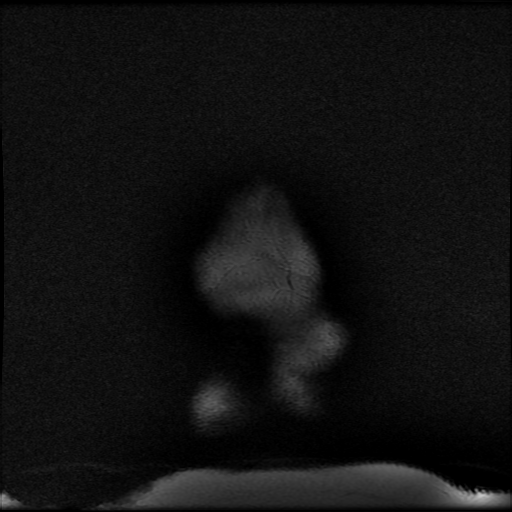
[im 14/28]
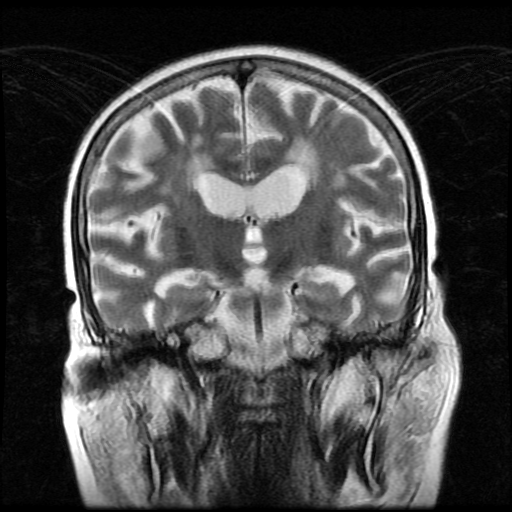
[im 28/28]
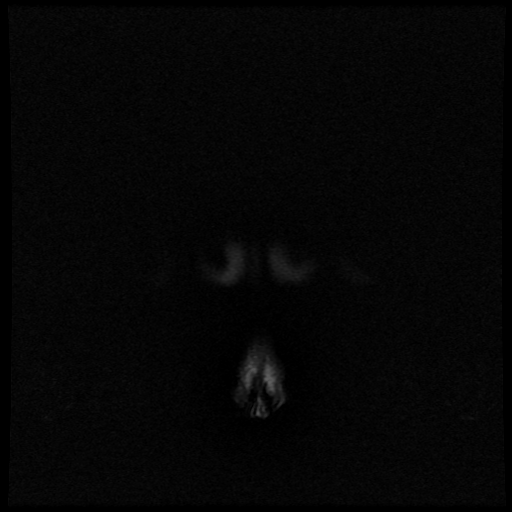

[Series 400: DWI · axial · 3.0mm · 1.09mm/px · z∈[-21,+120]mm · 5 of 48 slices shown (3 of 4)]
[im 1/48]
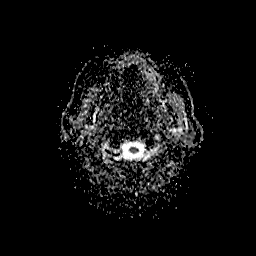
[im 12/48]
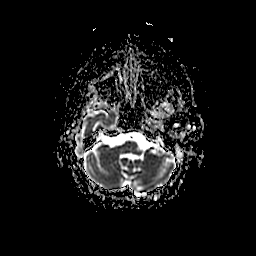
[im 24/48]
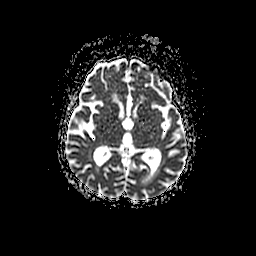
[im 36/48]
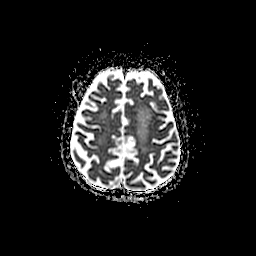
[im 48/48]
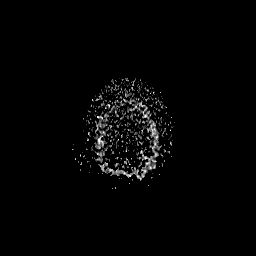

[Series 500: DWI · coronal · 5.0mm · 1.09mm/px · 4 of 36 slices shown (4 of 4)]
[im 1/36]
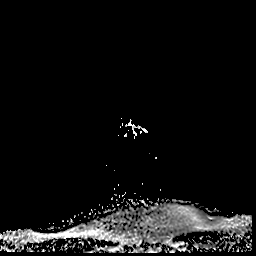
[im 12/36]
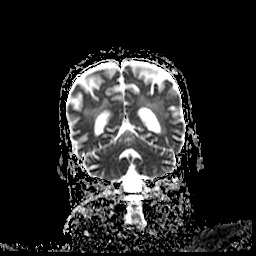
[im 24/36]
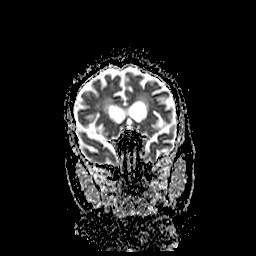
[im 36/36]
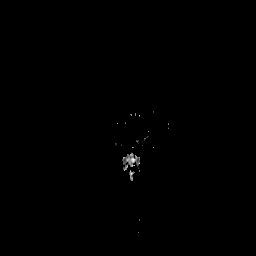

[36 of 48 positions shown; findings below may reference images not displayed]

FINDINGS: Brain: Restricted diffusion in the subcortical left parietal lobe
consistent with acute infarct.

No definite explanation for left arm numbness.

No hemorrhage, hydrocephalus, or mass.

Confluent cerebral white matter T2 and FLAIR hyperintensity.
Generalized atrophy, progressed from 3359 and greater than expected
for age

Vascular: There is loss of expected flow void in the left vertebral
artery at the V3 segment. Normal flow void in the intradural segment
but smaller than on comparison scan.

Skull and upper cervical spine: No focal lesion.

Sinuses/Orbits: Bilateral cataract resection.
IMPRESSION: 1. Small acute infarct in the subcortical left parietal lobe.
2. Extensive chronic white matter disease in this patient with
history of whole-brain radiation. Generalized atrophy has progressed
from 3359.
3. Abnormal appearance of the left vertebral artery suggesting slow
flow or occlusion in the neck, new from 3359.

## 2017-08-04 DIAGNOSIS — S42291D Other displaced fracture of upper end of right humerus, subsequent encounter for fracture with routine healing: Secondary | ICD-10-CM | POA: Diagnosis not present

## 2017-08-07 DIAGNOSIS — S42291D Other displaced fracture of upper end of right humerus, subsequent encounter for fracture with routine healing: Secondary | ICD-10-CM | POA: Diagnosis not present

## 2017-08-09 IMAGING — RF DG SINUS / FISTULA TRACT / ABSCESSOGRAM
3 series · 7 of 7 positions shown · non-contrast
Comparison: none

INDICATION: Pelvic diverticular abscess, status post CT percutaneous drainage
09/12/2016. Outpatient follow-up.

[Series 1: one shot · 1 of 1 slices shown (1 of 2)]
[im 1/1]
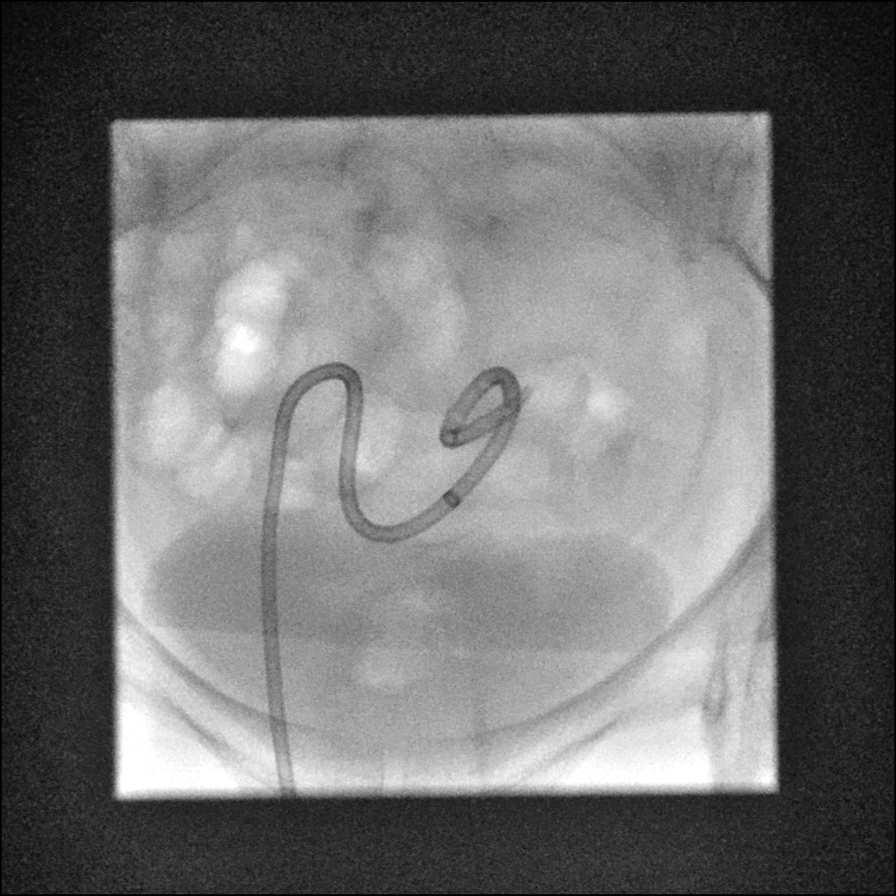

[Series 2: sequence · 4 of 46 frames shown]
[frame 1/46]
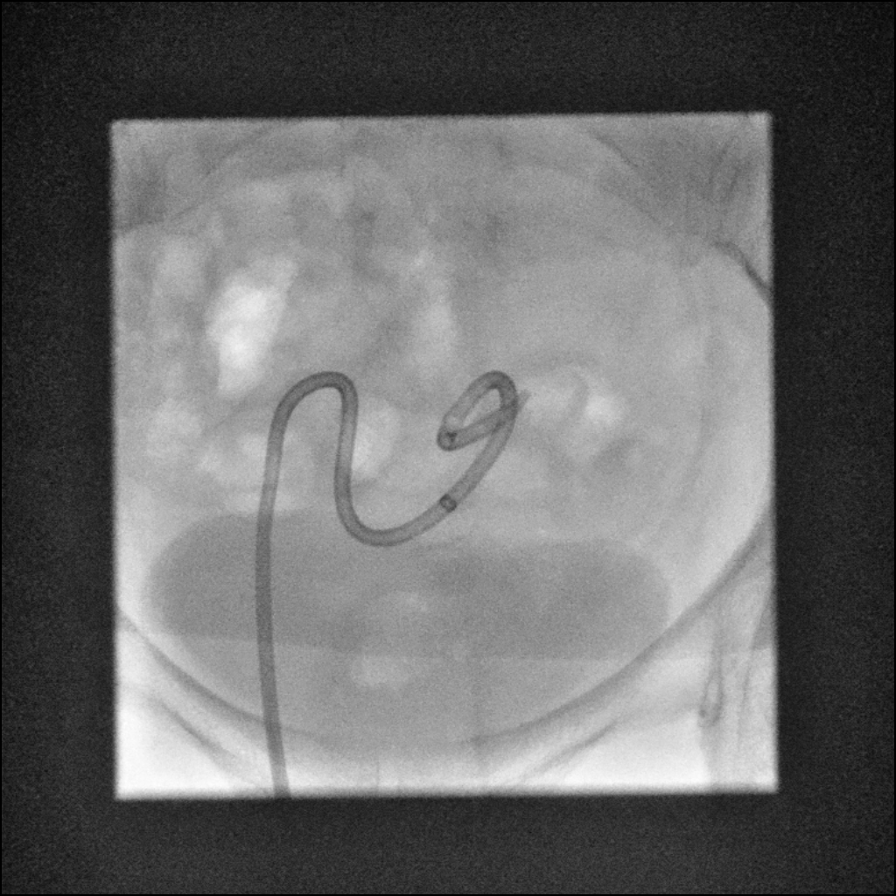
[frame 7/46]
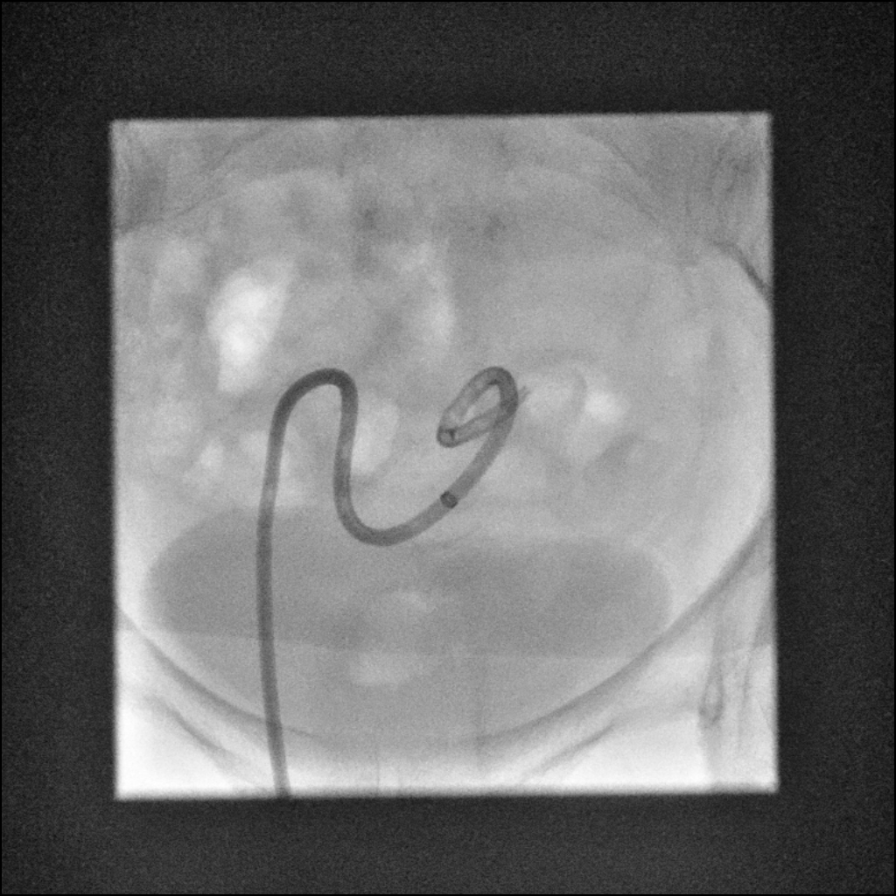
[frame 24/46]
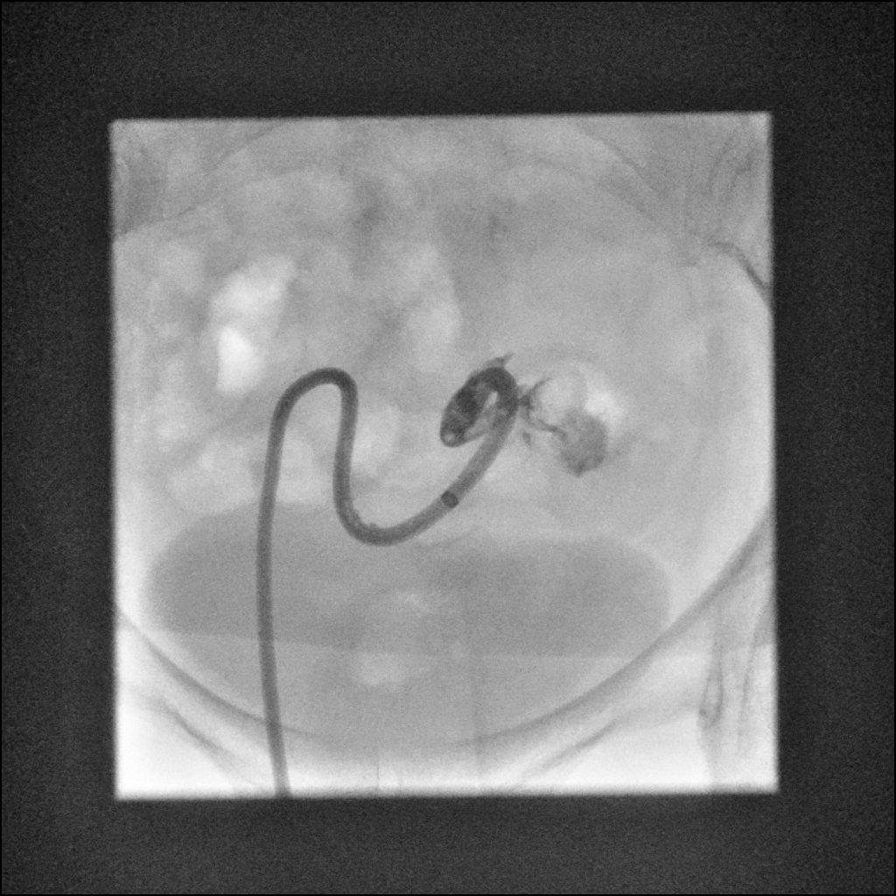
[frame 40/46]
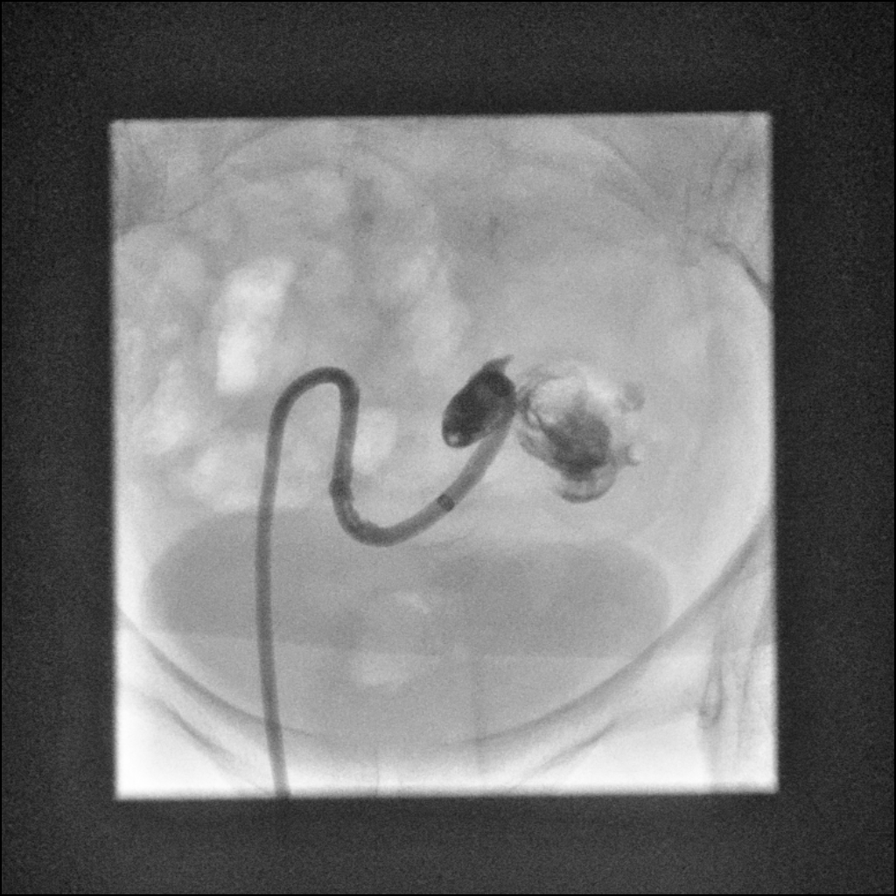

[Series 3: one shot · 2 of 2 slices shown (2 of 2)]
[im 1/2]
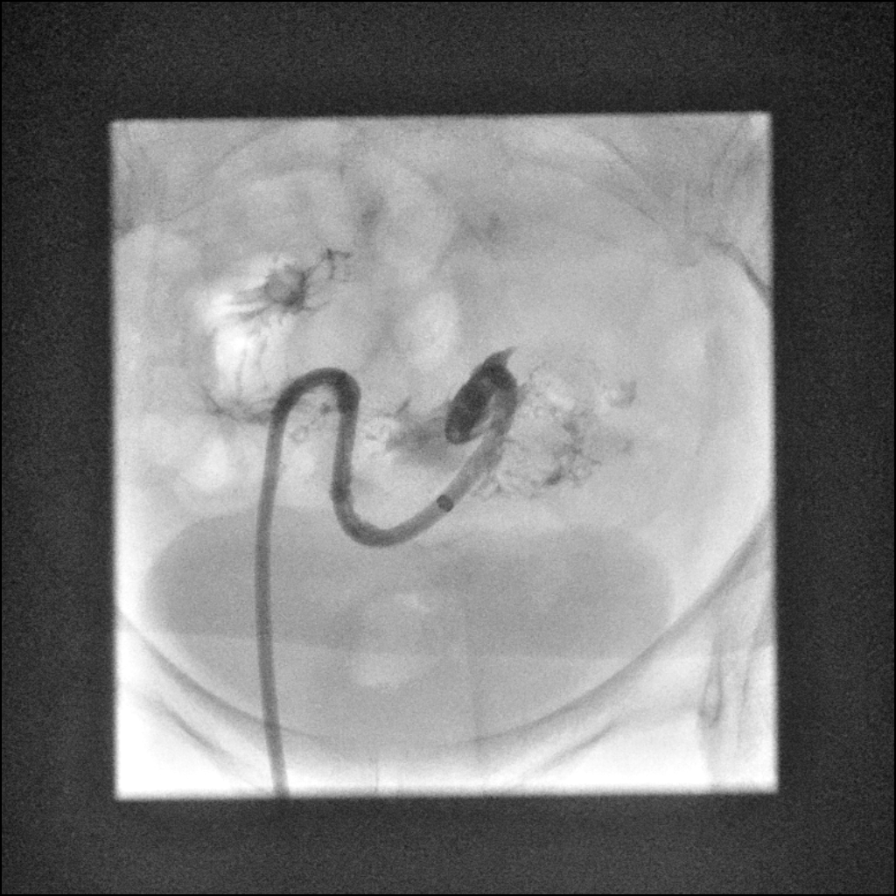
[im 2/2]
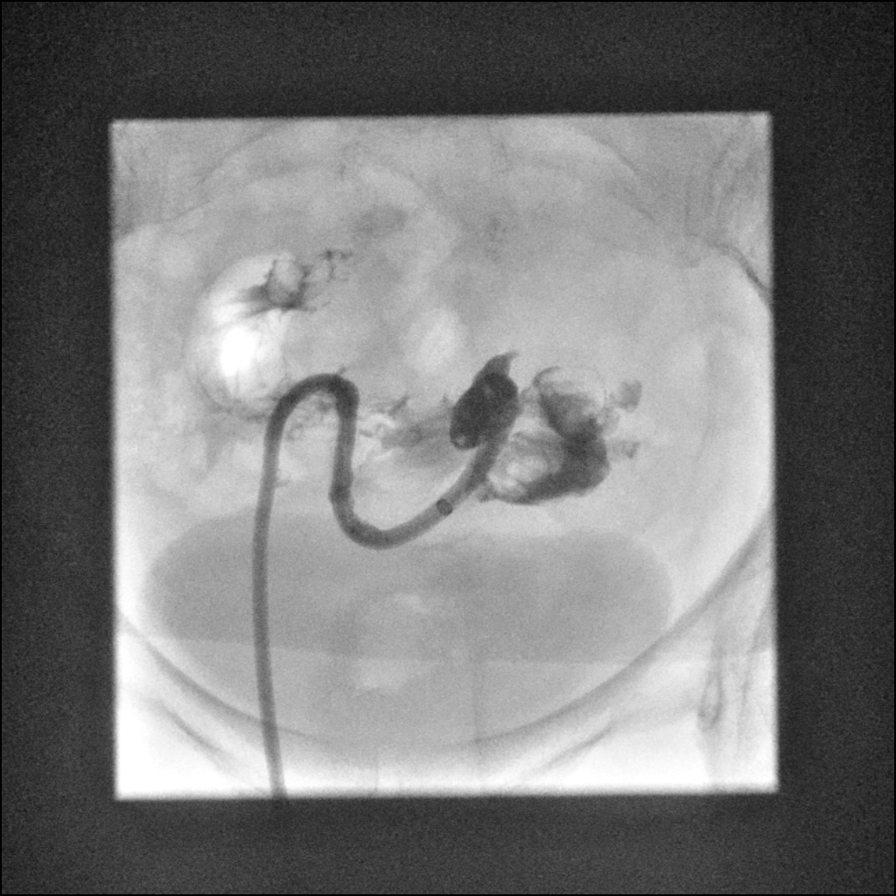

[7 of 7 positions shown; findings below may reference images not displayed]

EXAM:
ABSCESS INJECTION

MEDICATIONS:
None.  Patient has completed antibiotics as an outpatient

ANESTHESIA/SEDATION:
None.

COMPLICATIONS:
None immediate.

PROCEDURE:
Under fluoroscopy, the existing midline pelvic diverticular abscess
catheter was injected with contrast. This demonstrates a collapsed
abscess cavity at the drain catheter site correlating with the CT.
Small adjacent fistula to the sigmoid colon evident. Diverticulosis
present.
IMPRESSION: Stable drain catheter position.

Collapsed resolved pelvic abscess

Small patent fistula from the collapsed abscess site to the adjacent
sigmoid colon.

## 2017-08-09 IMAGING — CT CT ABD-PELV W/ CM
3 of 4 series · 12 of 36 positions shown, 18 images · IV contrast ([ID] [id number] [id number])
Comparison: 09/20/2016, 09/21/2016

CLINICAL DATA: Pelvic diverticulitis complicated by abscess. Status
post percutaneous drainage 09/21/2016

EXAM:
CT ABDOMEN AND PELVIS WITH CONTRAST
TECHNIQUE: Multidetector CT imaging of the abdomen and pelvis was performed
using the standard protocol following bolus administration of
intravenous contrast.
CONTRAST:  100mL [id number] IOPAMIDOL ([id number]) INJECTION 61%

[Series 3: abd/pelvis with · axial · 0.78mm/px · z∈[-316,+34]mm · 8 of 91 slices shown, 13 images]
[im 11/91  soft-tissue]
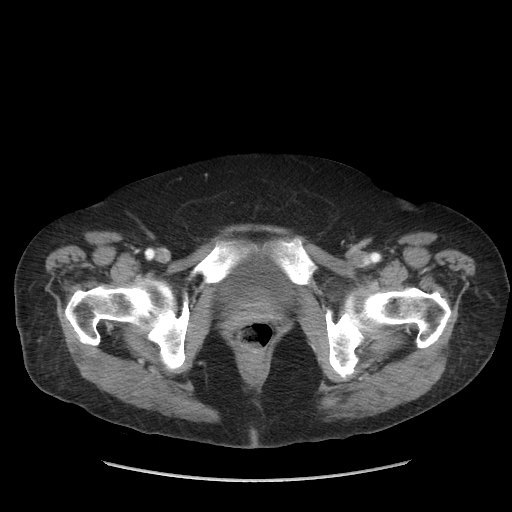
[im 11/91  bone]
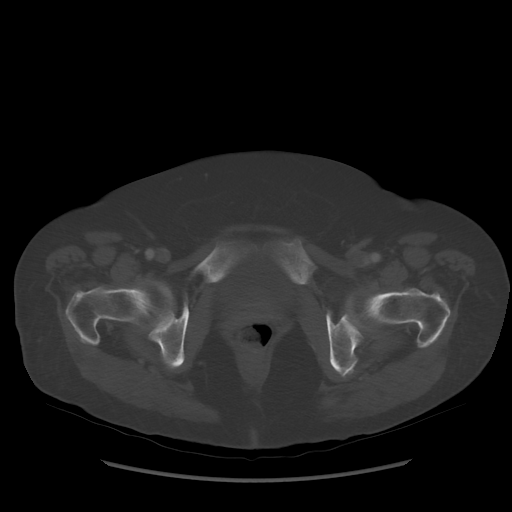
[im 21/91  soft-tissue]
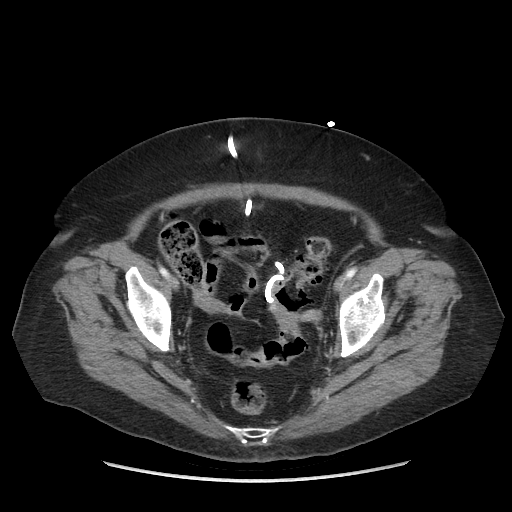
[im 31/91  soft-tissue]
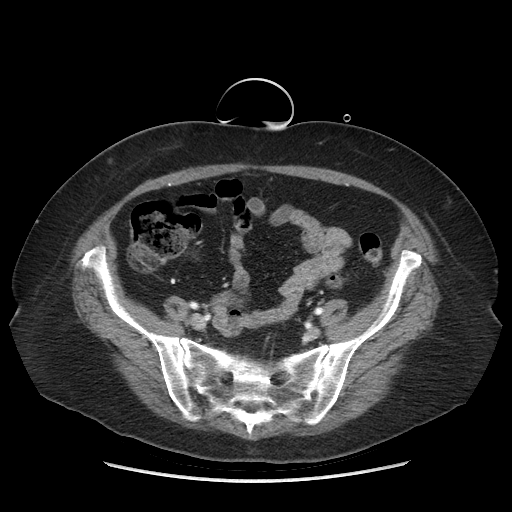
[im 41/91  soft-tissue]
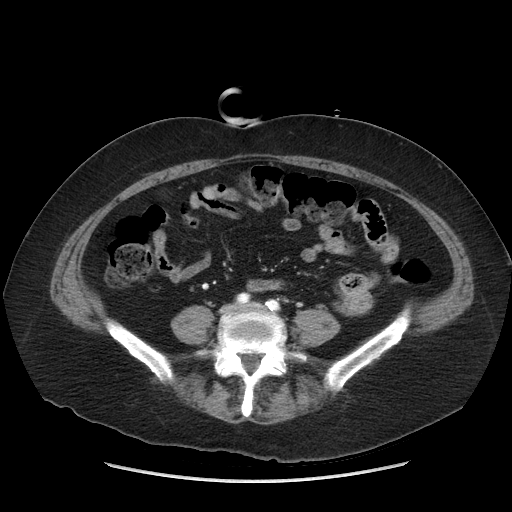
[im 51/91  soft-tissue]
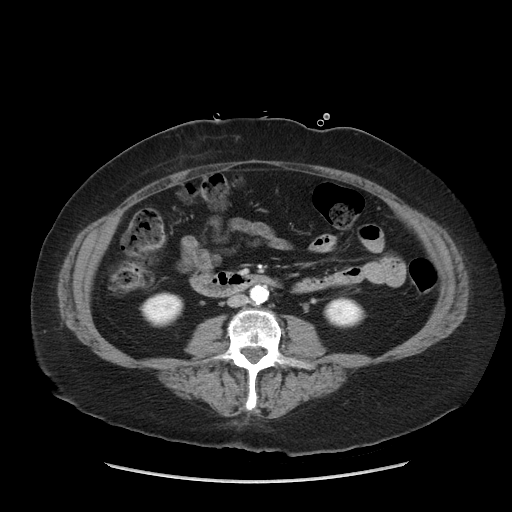
[im 51/91  lung]
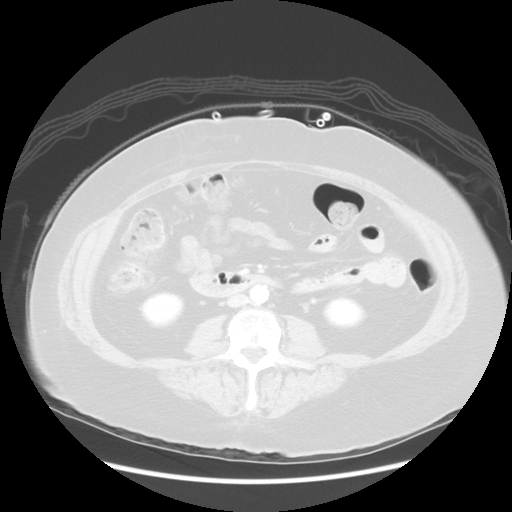
[im 61/91  soft-tissue]
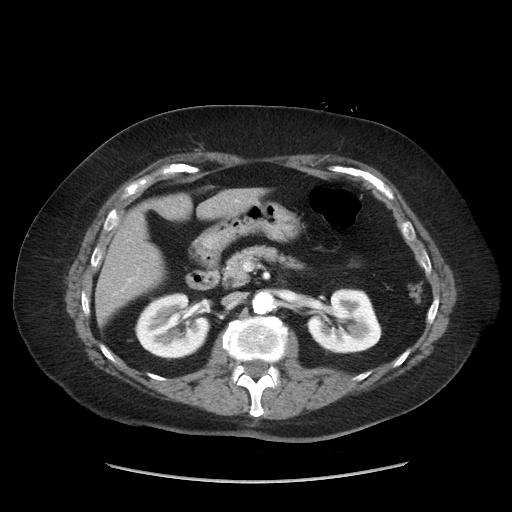
[im 61/91  lung]
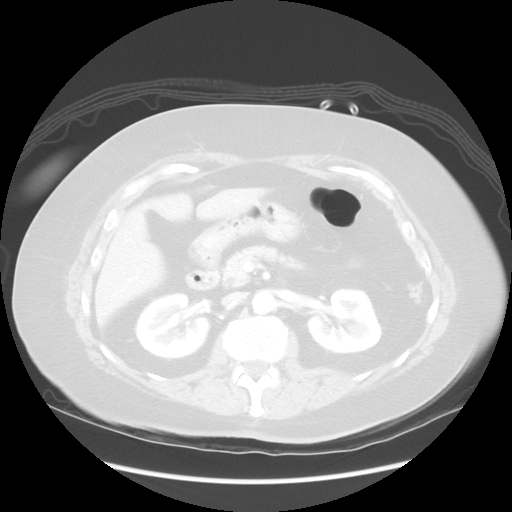
[im 71/91  soft-tissue]
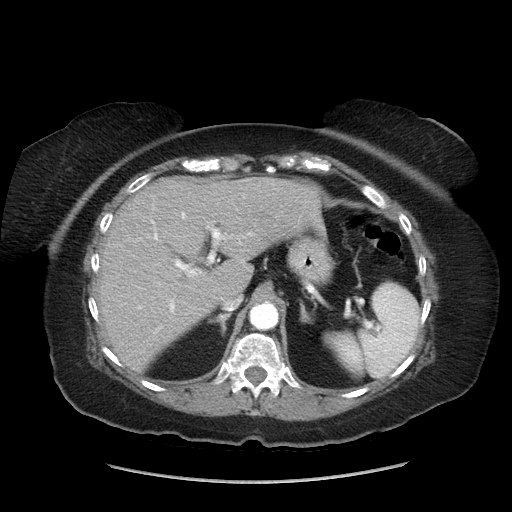
[im 71/91  lung]
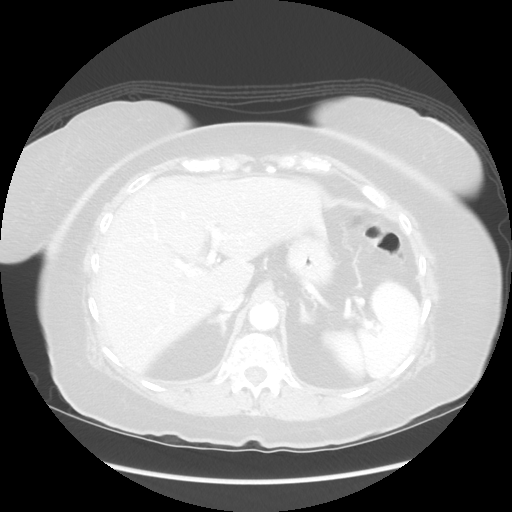
[im 81/91  soft-tissue]
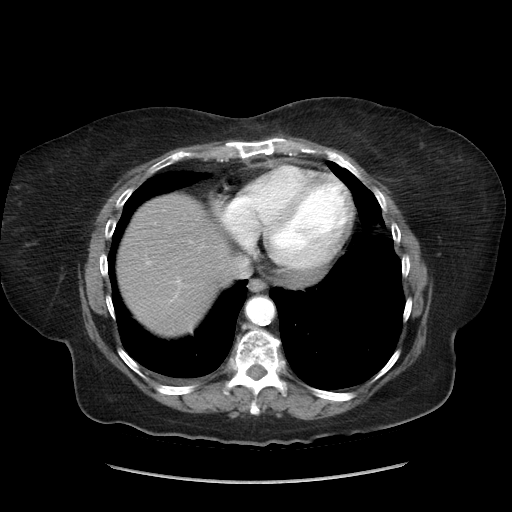
[im 81/91  lung]
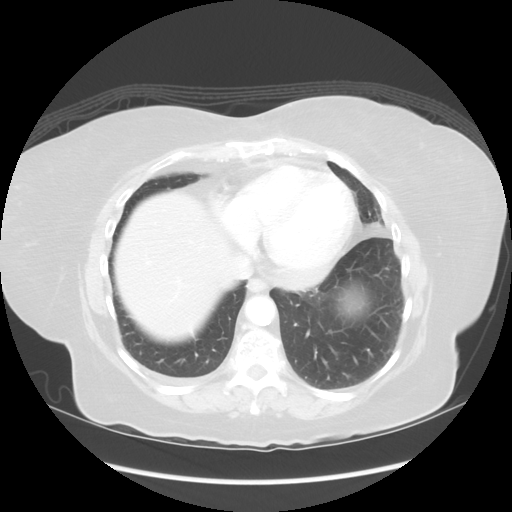

[Series 601: coronal body · coronal · 0.91mm/px · 1 of 121 slices shown, 2 images]
[im 41/121  soft-tissue]
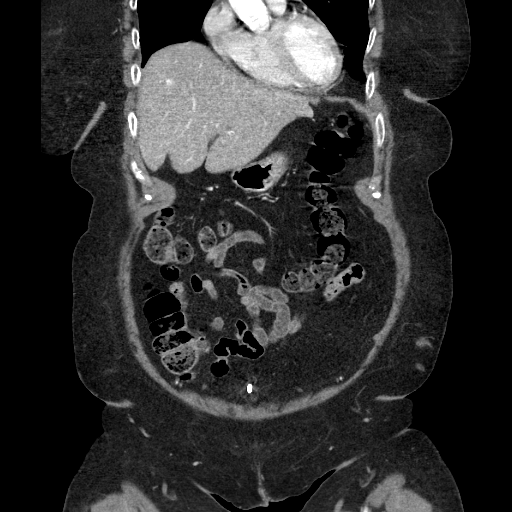
[im 41/121  bone]
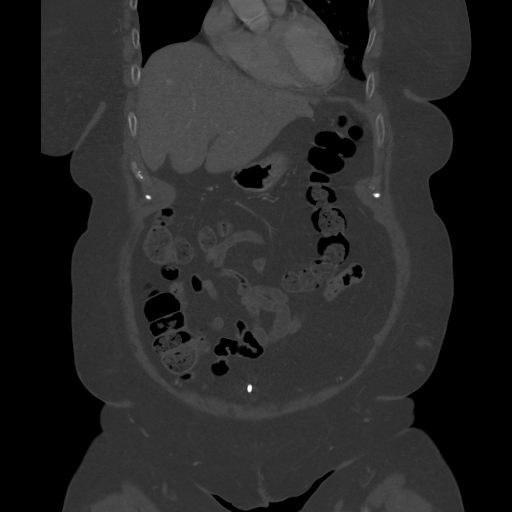

[Series 602: sagittal body · sagittal · 0.91mm/px · 3 of 161 slices shown]
[im 18/161  soft-tissue]
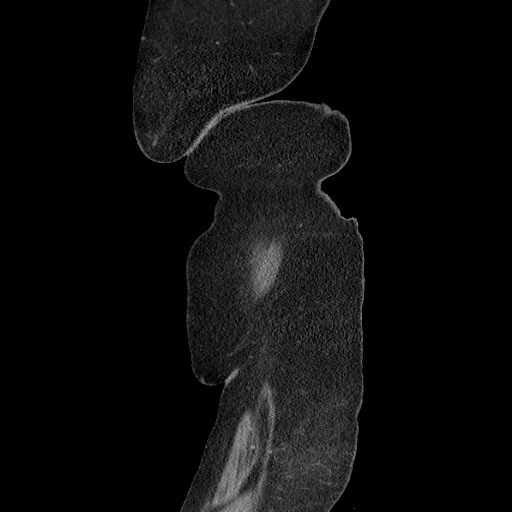
[im 36/161  soft-tissue]
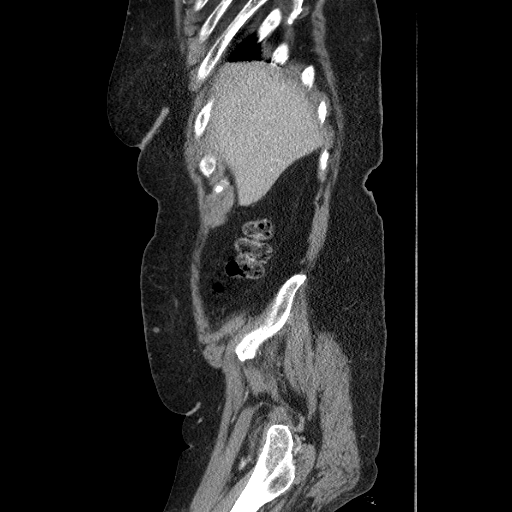
[im 54/161  soft-tissue]
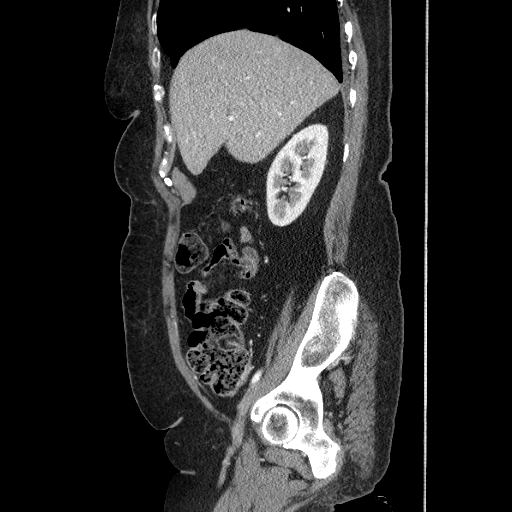

[12 of 36 positions shown; findings below may reference images not displayed]

FINDINGS: Lower chest: New trace residual right pleural effusion. Resolved
bibasilar atelectasis. Healing lateral left rib fractures. Normal
heart size. Thoracic aortic atherosclerosis present.

Hepatobiliary: Remote cholecystectomy. Stable 8 mm enhancing focus
in the right hepatic dome anteriorly, images 16 compatible with a
transient hepatic attenuation defect versus a small hemangioma. No
other hepatic abnormality. No biliary dilatation.

Pancreas: Unremarkable. No pancreatic ductal dilatation or
surrounding inflammatory changes.

Spleen: Normal in size without focal abnormality.

Adrenals/Urinary Tract: Adrenal glands are unremarkable. Kidneys are
normal, without renal calculi, focal lesion, or hydronephrosis.
Bladder is unremarkable.

Stomach/Bowel: Negative for bowel obstruction, significant
dilatation, ileus, or free air. Normal appendix demonstrated.

Anterior midline pelvic abscess drain is stable in position adjacent
to the sigmoid colon. Previous abscess is completely drain.
Diverticulitis has resolved. No new fluid collection. Extra luminal
air present at the drain catheter site suspicious for residual
fistula.

Vascular/Lymphatic: Aortic atherosclerosis evident. Negative for
aneurysm or occlusive process. No adenopathy.

Reproductive: Remote hysterectomy.  No adnexal mass or abnormality.

Other: No abdominal wall hernia or abnormality. No abdominopelvic
ascites.

Musculoskeletal: Degenerative changes noted of the spine. No acute
osseous finding.
IMPRESSION: Resolved pelvic diverticular abscess, status post percutaneous
drainage. Resolved diverticulitis.

Small amount of extraluminal air about the sigmoid colon at the
drain catheter site suspicious for residual fistula. Fluoroscopic
injection to assess for fistula will also be performed today.

No new fluid collection, or obstruction.

## 2017-08-10 ENCOUNTER — Encounter: Payer: Self-pay | Admitting: Family Medicine

## 2017-08-10 ENCOUNTER — Ambulatory Visit (INDEPENDENT_AMBULATORY_CARE_PROVIDER_SITE_OTHER): Payer: Medicare Other | Admitting: Family Medicine

## 2017-08-10 VITALS — BP 114/64 | HR 81 | Temp 97.5°F | Resp 16 | Ht 62.0 in | Wt 144.2 lb

## 2017-08-10 DIAGNOSIS — Z923 Personal history of irradiation: Secondary | ICD-10-CM | POA: Diagnosis not present

## 2017-08-10 DIAGNOSIS — R221 Localized swelling, mass and lump, neck: Secondary | ICD-10-CM

## 2017-08-10 DIAGNOSIS — E118 Type 2 diabetes mellitus with unspecified complications: Secondary | ICD-10-CM

## 2017-08-10 DIAGNOSIS — R222 Localized swelling, mass and lump, trunk: Secondary | ICD-10-CM | POA: Diagnosis not present

## 2017-08-10 DIAGNOSIS — Z85118 Personal history of other malignant neoplasm of bronchus and lung: Secondary | ICD-10-CM | POA: Diagnosis not present

## 2017-08-10 DIAGNOSIS — Z794 Long term (current) use of insulin: Secondary | ICD-10-CM

## 2017-08-10 DIAGNOSIS — Z23 Encounter for immunization: Secondary | ICD-10-CM

## 2017-08-10 LAB — BASIC METABOLIC PANEL
BUN: 13 mg/dL (ref 6–23)
CHLORIDE: 101 meq/L (ref 96–112)
CO2: 32 mEq/L (ref 19–32)
CREATININE: 0.92 mg/dL (ref 0.40–1.20)
Calcium: 9.3 mg/dL (ref 8.4–10.5)
GFR: 64.3 mL/min (ref >60.00)
GLUCOSE: 215 mg/dL — AB (ref 70–99)
POTASSIUM: 4.4 meq/L (ref 3.5–5.1)
Sodium: 139 mEq/L (ref 135–145)

## 2017-08-10 LAB — HEMOGLOBIN A1C: Hgb A1c MFr Bld: 6.7 % — ABNORMAL HIGH (ref 4.6–6.5)

## 2017-08-10 LAB — MICROALBUMIN / CREATININE URINE RATIO
CREATININE, U: 38.6 mg/dL
MICROALB/CREAT RATIO: 7.5 mg/g (ref 0.0–30.0)
Microalb, Ur: 2.9 mg/dL — ABNORMAL HIGH (ref 0.0–1.9)

## 2017-08-10 MED ORDER — HUMULIN 70/30 KWIKPEN (70-30) 100 UNIT/ML ~~LOC~~ SUPN: PEN_INJECTOR | SUBCUTANEOUS | 6 refills | Status: DC

## 2017-08-10 MED ORDER — PANTOPRAZOLE SODIUM 40 MG PO TBEC: 40.0000 mg | DELAYED_RELEASE_TABLET | Freq: Every day | ORAL | 3 refills | Status: DC

## 2017-08-10 MED ORDER — ESCITALOPRAM OXALATE 5 MG PO TABS: 5.0000 mg | ORAL_TABLET | Freq: Every day | ORAL | 3 refills | Status: DC

## 2017-08-10 NOTE — Progress Notes (Signed)
OFFICE VISIT  08/10/2017   CC:  Chief Complaint  Patient presents with  . Follow-up    RCI, pt is not fasting.     HPI:    Patient is a 71 y.o. Caucasian female who presents accompanied by her husband today for f/u DM 2 and abnormal CXR 11/2016 (+remote hx of lung ca).  Due to elevated glucoses they have chosen to increase her sliding scale 70/30 bid from "2-4-6-8" to 4-03-31-09. This has resulted in improvement in morning glucoses to 150 avg and evening gluc to 190 avg over the last month or so. No hypoglycemia.    Denies burning, tingling, or numbness in feet.  No hx of diabetic ulcer.  We discussed her hx of abnormal CXR 11/2016, which showed right paratracheal prominence which may represent radiation fibrosis, although recurrent malignancy cannot be excluded.  CT scan of chest may be performed for further evaluation.  At last f/u visit 12/2016, pt was in favor of doing the chest CT.  However, even after doing cr to make sure she was ok to get contrast, it appears this CT was never ordered.   She denies cough, SOB, hemoptysis, or CP.  ROS: no polyuria or polydipsia.  No HAs, no dizziness, no focal weakness, no rash, no dysuria or hematuria.     Past Medical History:  Diagnosis Date  . Age-related nuclear cataract of both eyes 2016   +cortical age related cataracts OU   . Allergy    seasonal  . Arthritis    hands  . Bilateral diabetic retinopathy (HCC) 2015   Dr. Zigmund Daniel  . Blood transfusion without reported diagnosis    when taking chemo  . Chronic combined systolic and diastolic congestive heart failure (HCC)    Takotsubo CM (resolved @5 /2018):  a. 08/2016 Echo: EF 30-35%, gr1DD, PASP 62mmHg;  b. 11/2016 Echo: EF 40-45%, no rwma, nl RV fxn, mild TR, PASP 60mmHg.  . CKD (chronic kidney disease), stage II    GFR 60s  . 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  Marland Kitchen COPD (chronic obstructive pulmonary disease) ([place])   . CVA (cerebral  vascular accident) ([place]) 09/2016   MRI did show a left-sided small ischemic stroke which was acute but likely incidental (MRI was done b/c pt had TIA sx's in hospital).  Neuro put her on plavix at that time.  . Diabetes mellitus with complication (HCC)    diab retinopathy OU (laser)  . Diverticulitis 05/2016  . GERD (gastroesophageal reflux disease)    protonix  . History of diverticulitis of colon    with abscess; required IR percutaneous drain placed 09/2016.  Marland Kitchen Hyperlipidemia   . Hypertension   . Left bundle branch block (LBBB) 12/16/2016  . Lung cancer ([place])    non-small cell lung ca, stage III in 05/2011; systemic chemotherapy concurrent with radiation followed by prophylactic cranial irradiation and has been observation since July of 2010 with no evidence for disease recurrence-released from onc f/u 12/2014 (needs annual cxr by PCP).  CXR 08/2015 stable.  . Mild cognitive impairment with memory loss    Likely from brain radiation therapy  . Non-obstructive CAD (coronary artery disease)    a. Cath 2006 preserved LV fxn, scattered irregularities without critical stenosis; b. 2008 stress echo negative for ischemia, but with hypertensive response; c. 08/2016 Cath: D1 25%, otw nl.  . Normocytic anemia 2018   Iron, vit B12, folate all normal 12/2016.  Marland Kitchen Open-angle glaucoma 2016   Dr.  Bernstorf, (bilateral)---responding to topical therapy  . Osteopenia 03/2016   03/2016 DEXA T-score -2.2  . Pneumonia    hospitalization 11/2016; hypoxemic resp failure--d/c'd home with home oxygen therapy  . Subclinical hyperthyroidism 2018  . Takotsubo cardiomyopathy    a. 08/2016 Echo: EF 30-35%, gr1DD, PASP 66mmHg;  b. 08/2016 Cath: nonobs Dzs;  c. 11/2016 Echo: EF 40-45%, no rwma, nl RV fxn, mild TR, PASP 64mmHg.  ECHO 02/2017 EF 50-55%, grd II DD---essentially resolved Takotsubo 02/2017.  . 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 08/2016 in setting of diverticulitis & pneumoperitoneum and Takotsubo CM  -->prolonged QT, seen by EP-->avoid meds with potential for QT prolongation.    Past Surgical History:  Procedure Laterality Date  . ABDOMINAL HYSTERECTOMY  1997  . APPENDECTOMY    . CARDIAC CATHETERIZATION N/A 09/14/2016   Procedure: Left Heart Cath and Coronary Angiography;  Surgeon: Troy Sine, MD;  Location: [facility];  Service: Cardiovascular; Mild nonobstructive CAD with 85% smooth narrowing in the first diagonal branch of the LAD; 10% smooth narrowing of the ostial proximal left circumflex coronary artery; and a normal dominant RCA.  Marland Kitchen CARPAL TUNNEL RELEASE    . CATARACT EXTRACTION    . CHOLECYSTECTOMY  2002  . COLONOSCOPY  10/24/00   normal.  [employer] hemoccult testing via [place] 06/18/15 was NEG  . IR GENERIC HISTORICAL  09/19/2016   IR SINUS/FIST TUBE CHK-NON GI 09/19/2016 Corrie Mckusick, DO [facility]  . IR GENERIC HISTORICAL  10/06/2016   IR RADIOLOGIST EVAL & MGMT 10/06/2016 [facility]  . IR GENERIC HISTORICAL  10/26/2016   IR RADIOLOGIST EVAL & MGMT 10/26/2016 [facility]  . IR GENERIC HISTORICAL  11/03/2016   IR RADIOLOGIST EVAL & MGMT 11/03/2016 Ardis Rowan, PA-C [facility]  . IR GENERIC HISTORICAL  11/24/2016   IR RADIOLOGIST EVAL & MGMT 11/24/2016 Ardis Rowan, PA-C [facility]  . IR GENERIC HISTORICAL  12/05/2016   Fistula smaller/improving.  IR SINUS/FIST TUBE CHK-NON GI 12/05/2016 Sandi Mariscal, MD [facility]  . IR GENERIC HISTORICAL  12/22/2016   IR RADIOLOGIST EVAL & MGMT 12/22/2016 Greggory Keen, MD [facility]  . IR RADIOLOGIST EVAL & MGMT  01/10/2017  . IR RADIOLOGIST EVAL & MGMT  02/09/2017  . IR SINUS/FIST TUBE CHK-NON GI  03/28/2017  . LEFT HEART CATHETERIZATION WITH CORONARY ANGIOGRAM N/A 05/30/2012   Procedure: LEFT HEART CATHETERIZATION WITH CORONARY ANGIOGRAM;  Surgeon: Burnell Blanks, MD;  Location: [facility];  Service: Cardiovascular: Mild, non-obstructive CAD  . TRANSTHORACIC ECHOCARDIOGRAM  09/14/2016;  11/2016; 02/2017   08/2016: EF 30-35 %, Akinesis of the mid-apicalanteroseptal myocardium.  Grade I DD.  Mild pulm HTN.  Repeat echo 11/2016, EF 40-45%, no rwma, nl RV fxn, mild TR, PASP 16mmHg.  02/2017 EF 50-55%, grade II DD    Outpatient Medications Prior to Visit  Medication Sig Dispense Refill  . acetaminophen (TYLENOL) 500 MG tablet Take 500-1,000 mg by mouth every 6 (six) hours as needed for headache (or pain).     Marland Kitchen albuterol (PROAIR HFA) 108 (90 Base) MCG/ACT inhaler Inhale 2 puffs into the lungs every 6 (six) hours as needed for wheezing or shortness of breath. 18 g 3  . albuterol (PROVENTIL) (2.5 MG/3ML) 0.083% nebulizer solution USE ONE VIAL IN NEBULIZER EVERY 4 HOURS AS NEEDED FOR WHEEZING OR SHORTNESS OF BREATH 75 mL 3  . aspirin 81 MG chewable tablet Chew 81 mg by mouth daily.    Marland Kitchen  atorvastatin (LIPITOR) 80 MG tablet Take 1 tablet (80 mg total) by mouth daily. 90 tablet 1  . cetirizine (ZYRTEC) 10 MG tablet Take 10 mg by mouth daily.    . clopidogrel (PLAVIX) 75 MG tablet TAKE ONE TABLET BY MOUTH ONCE DAILY 30 tablet 6  . fluticasone (FLOVENT HFA) 220 MCG/ACT inhaler Inhale 2 puffs into the lungs 2 (two) times daily. 12 g 6  . furosemide (LASIX) 40 MG tablet Take 0.5 tablets (20 mg total) by mouth daily. 30 tablet 6  . glucose blood test strip Use to check blood sugar three times daily 100 each 12  . HYDROcodone-acetaminophen (NORCO/VICODIN) 5-325 MG tablet Take 2 tablets by mouth every 4 (four) hours as needed. 10 tablet 0  . metoprolol tartrate (LOPRESSOR) 25 MG tablet TAKE 25 MG ( 1 TABLET)  IN THE MORNING , TAKE 50 MG ( 2 TABLETS) AT BEDTIME 270 tablet 3  . nitroGLYCERIN (NITROSTAT) 0.4 MG SL tablet Place 1 tablet (0.4 mg total) under the tongue every 5 (five) minutes as needed. For chest pain 50 tablet 3  . ONE TOUCH ULTRA TEST test strip USE  STRIP TO CHECK GLUCOSE THREE TIMES DAILY 100 each 11  . ONETOUCH DELICA LANCETS 81W MISC TEST 3 TIMES A DAY 100 each 11  . potassium  chloride (K-DUR) 10 MEQ tablet TAKE 1 TABLET BY MOUTH ONCE DAILY 90 tablet 1  . escitalopram (LEXAPRO) 5 MG tablet Take 1 tablet (5 mg total) by mouth daily. 90 tablet 0  . HUMULIN 70/30 KWIKPEN (70-30) 100 UNIT/ML PEN Per sliding scale, takes this qAM and aPM:  140-199 - 2 units, 200-250 - 4 units, 251-299 - 6 units,  300-349 - 8 units,  350 or above 10 units. Dispense syringes and needles as needed, Ok to switch to PEN if approved. Substitute to any brand approved. DX DM2, Code E11.65 15 mL 6  . pantoprazole (PROTONIX) 40 MG tablet Take 1 tablet (40 mg total) by mouth daily. 30 tablet 6   No facility-administered medications prior to visit.     Allergies  Allergen Reactions  . Lactose Intolerance (Gi) Diarrhea  . Ibuprofen Other (See Comments)    REACTION: Anxiousness, hyperventilates    ROS As per HPI  PE: Blood pressure 114/64, pulse 81, temperature (!) 97.5 F (36.4 C), temperature source Oral, resp. rate 16, height 5\' 2"  (1.575 m), weight 144 lb 4 oz (65.4 kg), SpO2 97 %. Gen: Alert, well appearing.  Patient is oriented to person, place, time, and situation. AFFECT: pleasant, lucid thought and speech. Foot exam - bilateral normal; no swelling, tenderness or skin or vascular lesions. Color and temperature is normal. Sensation is intact. Peripheral pulses are palpable. Toenails are normal.   LABS:  Lab Results  Component Value Date   TSH 1.24 04/20/2017   Lab Results  Component Value Date   WBC 7.0 04/20/2017   HGB 11.0 (L) 04/20/2017   HCT 33.8 (L) 04/20/2017   MCV 81.9 04/20/2017   PLT 285.0 04/20/2017   Lab Results  Component Value Date   CREATININE 0.92 08/10/2017   BUN 13 08/10/2017   NA 139 08/10/2017   K 4.4 08/10/2017   CL 101 08/10/2017   CO2 32 08/10/2017   Lab Results  Component Value Date   ALT 10 04/20/2017   AST 14 04/20/2017   ALKPHOS 99 04/20/2017   BILITOT 0.3 04/20/2017   Lab Results  Component Value Date   CHOL 65 12/02/2016   Lab  Results  Component Value Date   HDL 34 (L) 12/02/2016   Lab Results  Component Value Date   LDLCALC 23 12/02/2016   Lab Results  Component Value Date   TRIG 42 12/02/2016   Lab Results  Component Value Date   CHOLHDL 1.9 12/02/2016   Lab Results  Component Value Date   HGBA1C 6.7 (H) 08/10/2017    IMPRESSION AND PLAN:  1) DM 2, control improving with increase in 70/30 sliding scale bid. Check HbA1c today as well as urine microalb/cr. Feet exam normal today. Flu vaccine today.  2) Right paratracheal prominence on CXR 11/2016. Needs CT chest for further eval (?rad fibrosis vs recurrent lung malignancy?). BMET today. CT chest w/contrast ordered.  Spent 25 min with pt today, with >50% of this time spent in counseling and care coordination regarding the above problems.  An After Visit Summary was printed and given to the patient.  FOLLOW UP: Return in about 3 months (around 11/10/2017) for routine chronic illness f/u.  Signed:  Crissie Sickles, MD           08/10/2017

## 2017-08-11 DIAGNOSIS — S42291D Other displaced fracture of upper end of right humerus, subsequent encounter for fracture with routine healing: Secondary | ICD-10-CM | POA: Diagnosis not present

## 2017-08-12 ENCOUNTER — Encounter (HOSPITAL_BASED_OUTPATIENT_CLINIC_OR_DEPARTMENT_OTHER): Payer: Self-pay

## 2017-08-12 ENCOUNTER — Ambulatory Visit (HOSPITAL_BASED_OUTPATIENT_CLINIC_OR_DEPARTMENT_OTHER)
Admission: RE | Admit: 2017-08-12 | Discharge: 2017-08-12 | Disposition: A | Discharge status: Home / Self Care | Payer: Medicare Other | Source: Ambulatory Visit | Attending: Family Medicine | Admitting: Family Medicine

## 2017-08-12 DIAGNOSIS — J90 Pleural effusion, not elsewhere classified: Secondary | ICD-10-CM | POA: Diagnosis not present

## 2017-08-12 DIAGNOSIS — R222 Localized swelling, mass and lump, trunk: Secondary | ICD-10-CM | POA: Insufficient documentation

## 2017-08-12 DIAGNOSIS — R221 Localized swelling, mass and lump, neck: Secondary | ICD-10-CM | POA: Insufficient documentation

## 2017-08-12 DIAGNOSIS — Z85118 Personal history of other malignant neoplasm of bronchus and lung: Secondary | ICD-10-CM | POA: Diagnosis not present

## 2017-08-12 DIAGNOSIS — I313 Pericardial effusion (noninflammatory): Secondary | ICD-10-CM | POA: Insufficient documentation

## 2017-08-12 DIAGNOSIS — Z923 Personal history of irradiation: Secondary | ICD-10-CM | POA: Diagnosis not present

## 2017-08-12 DIAGNOSIS — I700 Atherosclerosis of aorta: Secondary | ICD-10-CM | POA: Insufficient documentation

## 2017-08-12 MED ORDER — IOPAMIDOL (ISOVUE-300) INJECTION 61%
100.0000 mL | Freq: Once | INTRAVENOUS | Status: AC | PRN
  Administered 2017-08-12: 80 mL via INTRAVENOUS

## 2017-08-14 IMAGING — CR DG CHEST 2V
2 series · 2 of 2 positions shown · non-contrast
Comparison: Chest x-ray 09/22/2016.  Chest CT 09/22/2016.

CLINICAL DATA: 70-year-old female with recent history of pneumonia.
Productive cough.

EXAM:
CHEST  2 VIEW

[w chest pa]
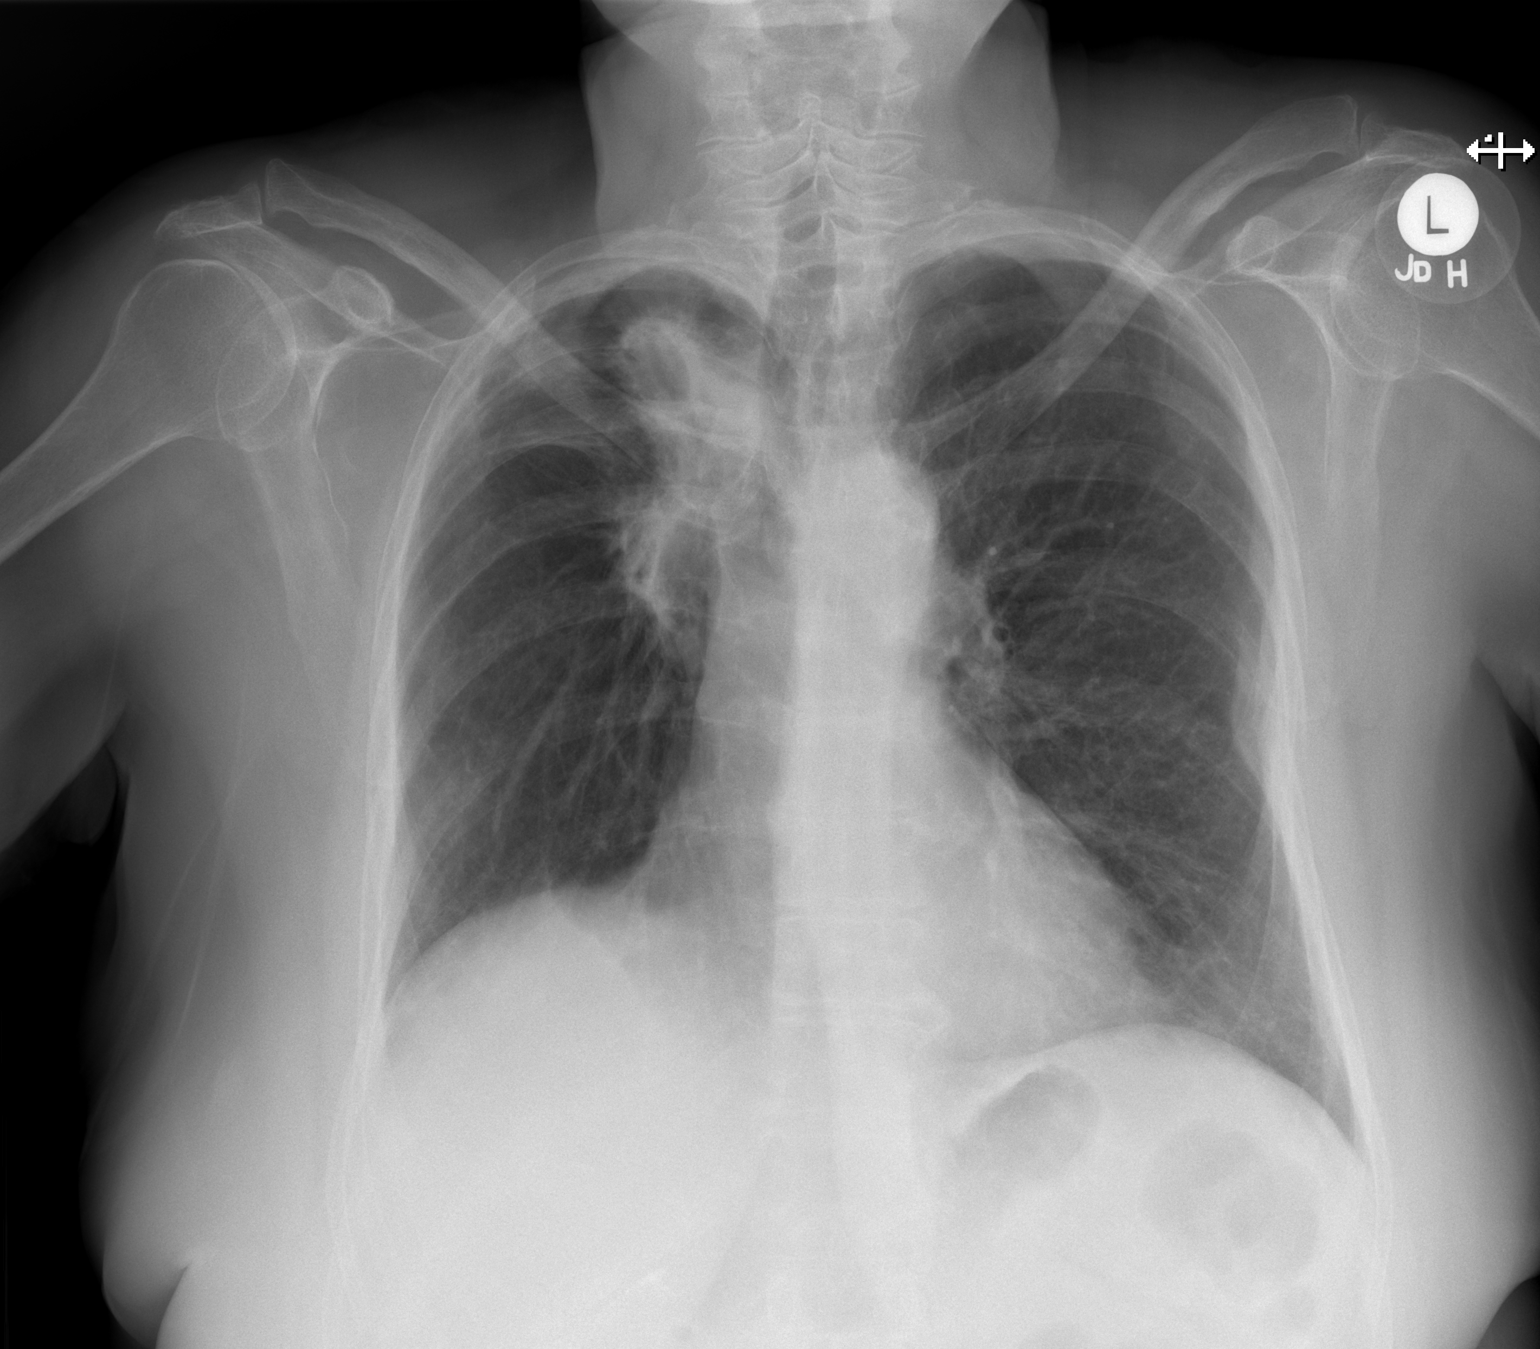

[w chest lat]
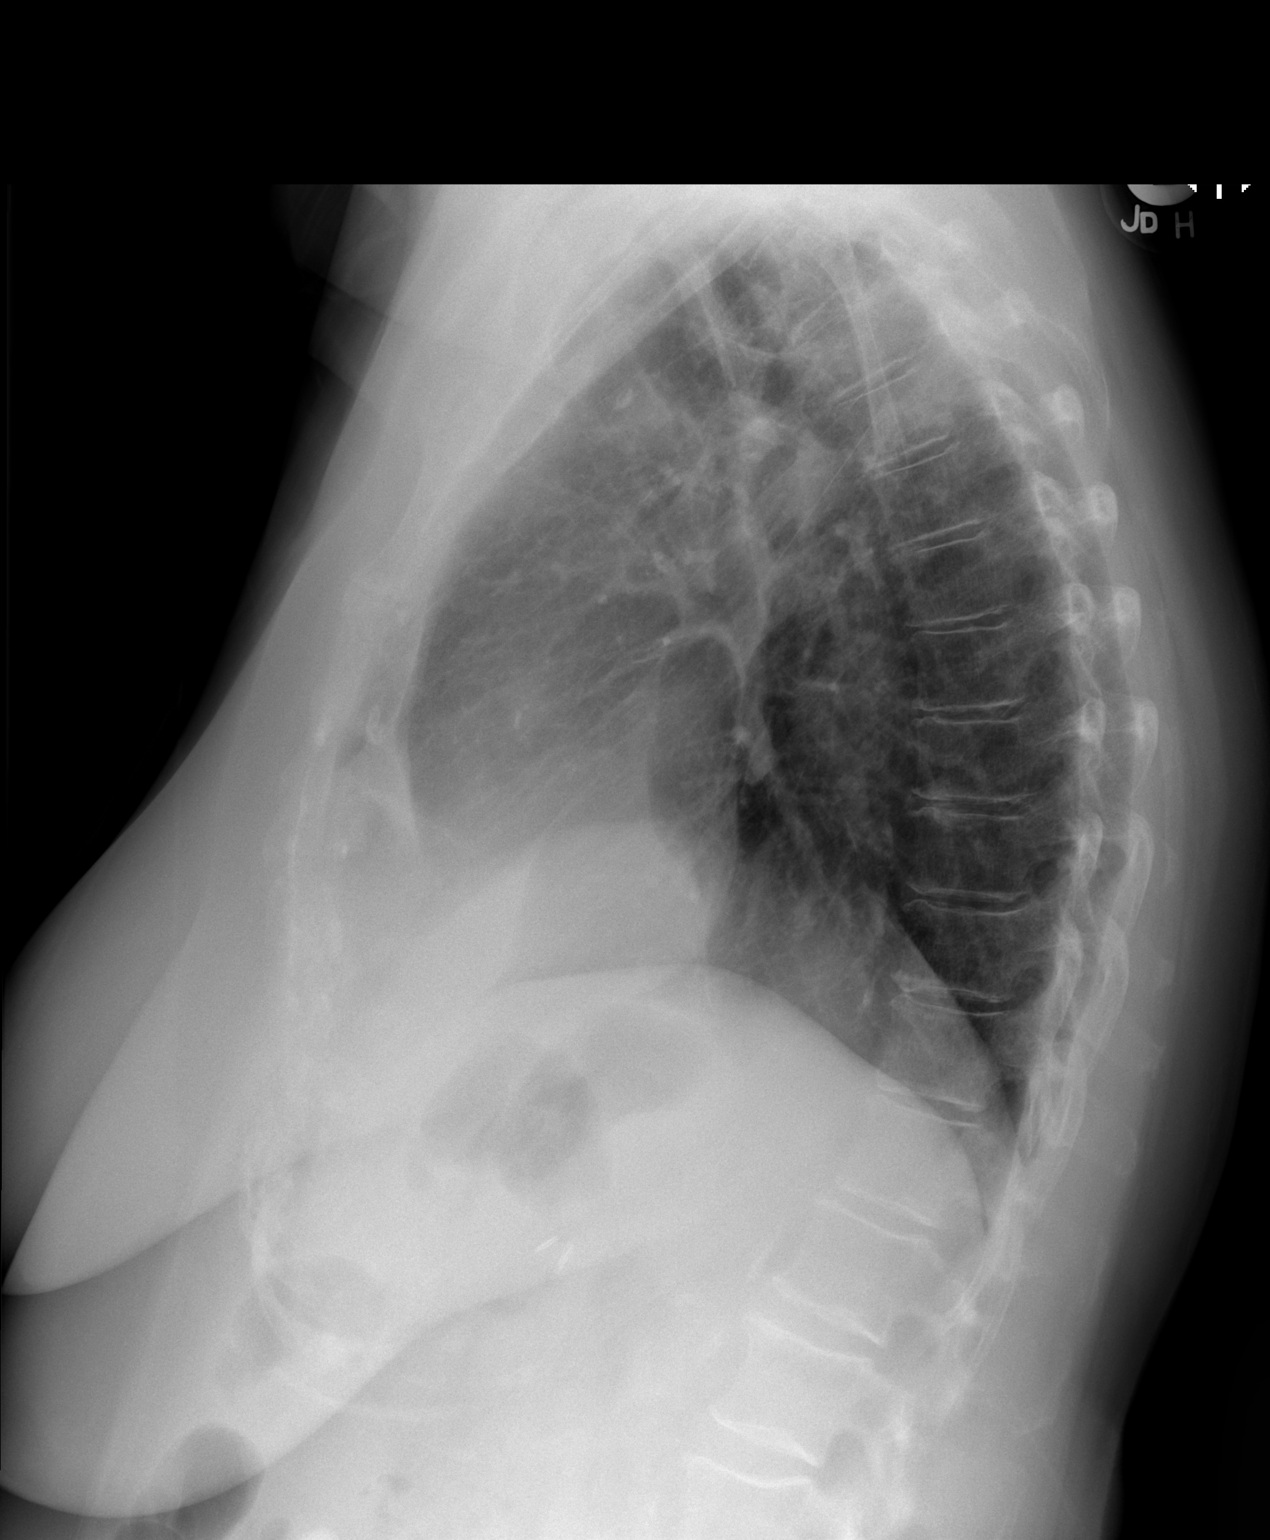

[2 of 2 positions shown; findings below may reference images not displayed]

FINDINGS: Again noted is a mass-like area of paramediastinal architectural
distortion in the upper right hemithorax, with upward retraction of
the minor fissure and the right hemidiaphragm, indicative of chronic
right upper lobe volume loss and postradiation fibrosis. Lucency in
the midst of this region suggests a central area of cavitation.
Focal area of pleural thickening in the lateral aspect of the mid
left hemithorax adjacent to multiple minimally displaced rib
fractures (better demonstrated on recent chest CT). No new acute
consolidative airspace disease. No pleural effusions. No evidence of
pulmonary edema. Heart size is normal.
IMPRESSION: 1. Treated right upper lobe demonstrates continued contraction and
volume loss related to postradiation mass-like fibrosis, as above.
There continues to be a small internal area of cavitation within
this region, similar to prior chest CT. No new acute findings are
noted.
2. Multiple minimally displaced healing left-sided rib fractures
with adjacent pleural thickening.

## 2017-08-17 ENCOUNTER — Encounter: Payer: Self-pay | Admitting: Family Medicine

## 2017-08-18 ENCOUNTER — Ambulatory Visit: Payer: Medicare Other | Admitting: Family Medicine

## 2017-08-21 ENCOUNTER — Telehealth: Payer: Self-pay | Admitting: Internal Medicine

## 2017-08-21 DIAGNOSIS — S42291D Other displaced fracture of upper end of right humerus, subsequent encounter for fracture with routine healing: Secondary | ICD-10-CM | POA: Diagnosis not present

## 2017-08-21 NOTE — Telephone Encounter (Signed)
Scheduled f/u appt per MD and Diane at PCP Dr. Anitra Lauth office. - she is saware that appt is schedule and will contact patient.

## 2017-08-29 IMAGING — RF DG SINUS / FISTULA TRACT / ABSCESSOGRAM
1 series · 8 of 8 positions shown · non-contrast
Comparison: CT abdomen and pelvis- 10/06/2016;

CLINICAL DATA: History of pelvic diverticular abscess, post
CT-guided percutaneous drainage catheter placement 09/12/2016.
TECHNIQUE: The patient was positioned supine on the fluoroscopy table.

[Series 1: one shot · 8 of 8 slices shown]
[im 1/8]
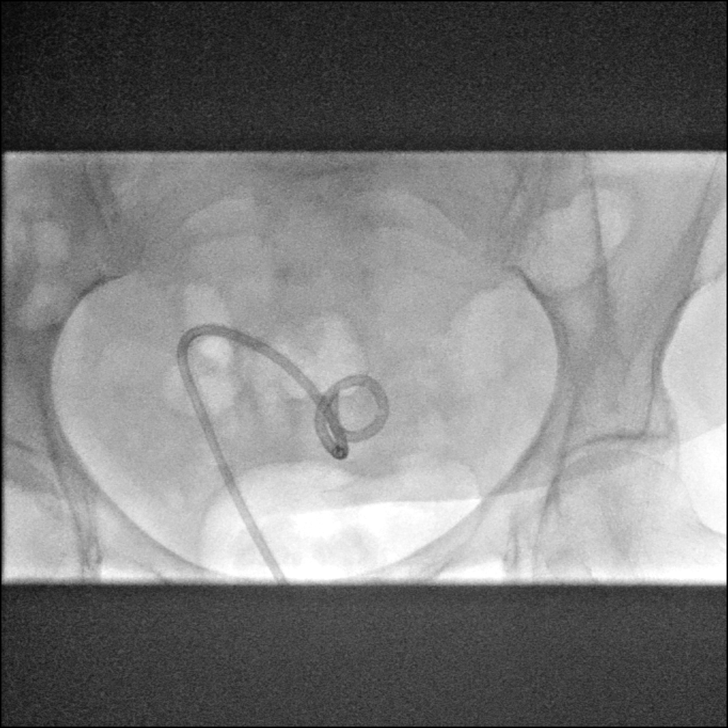
[im 2/8]
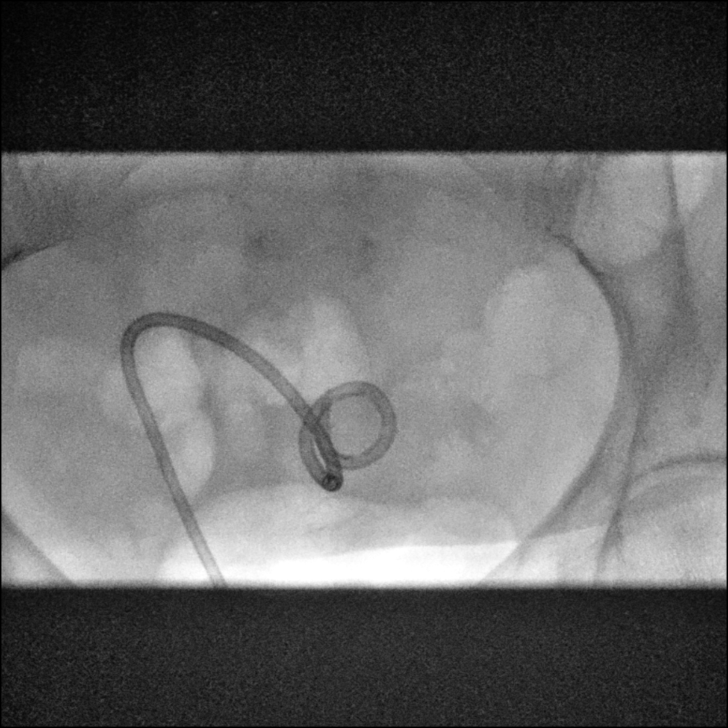
[im 3/8]
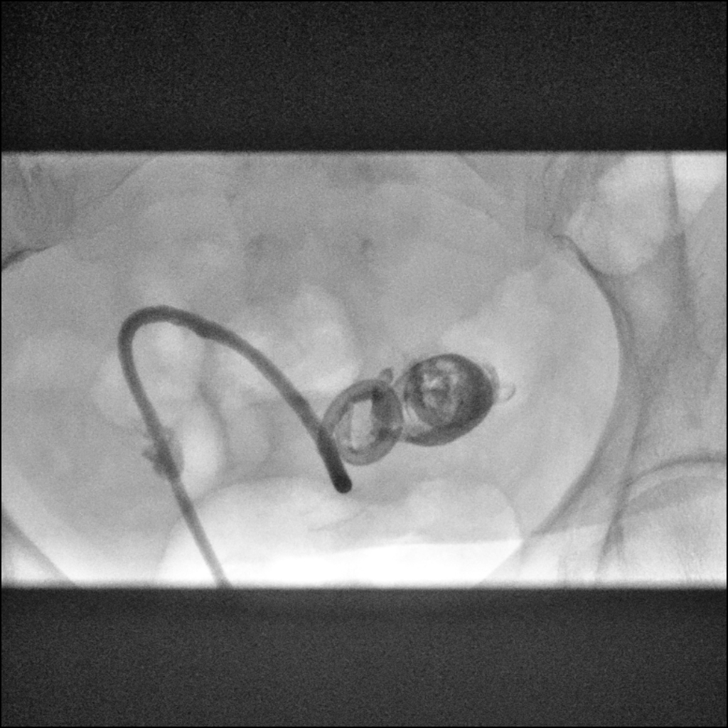
[im 4/8]
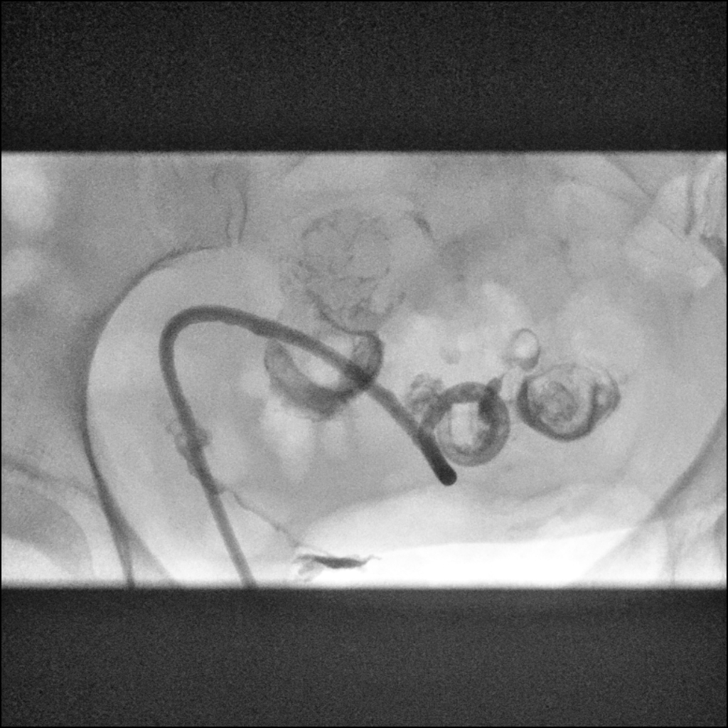
[im 5/8]
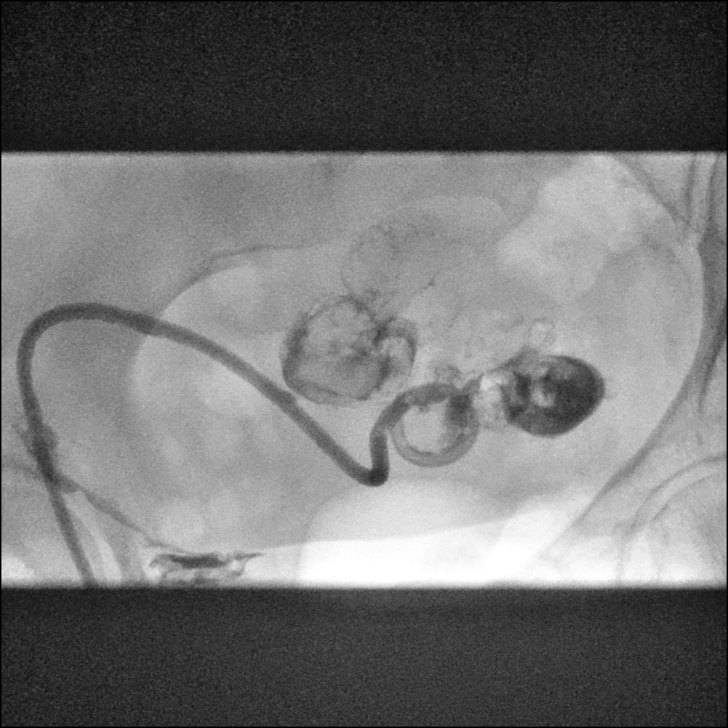
[im 6/8]
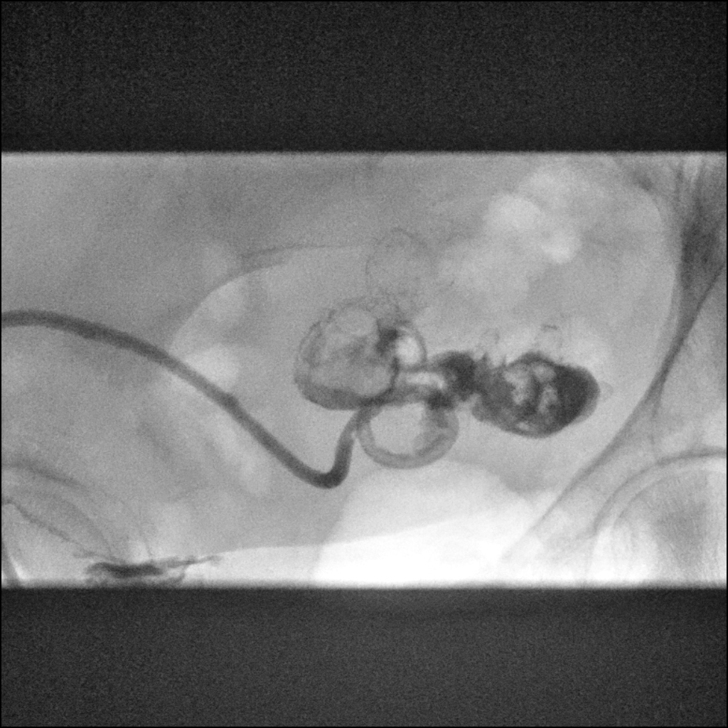
[im 7/8]
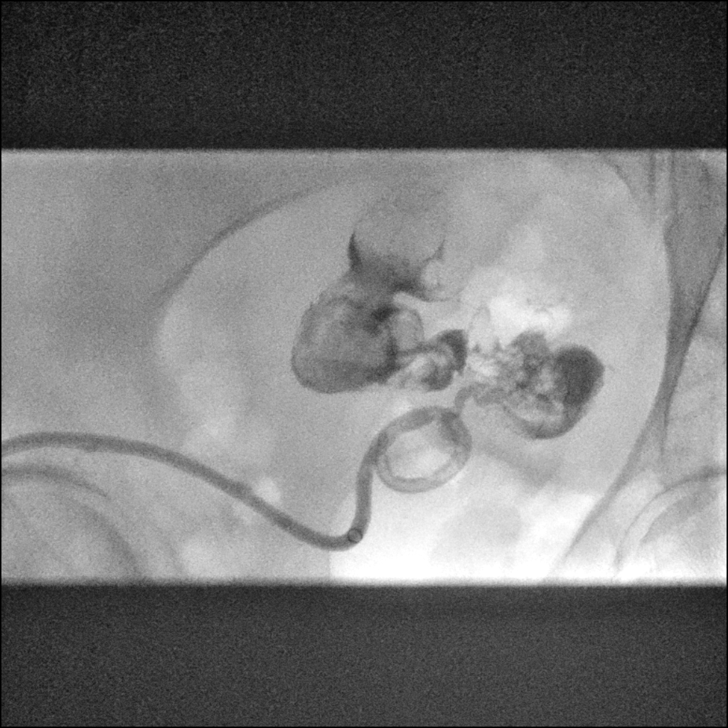
[im 8/8]
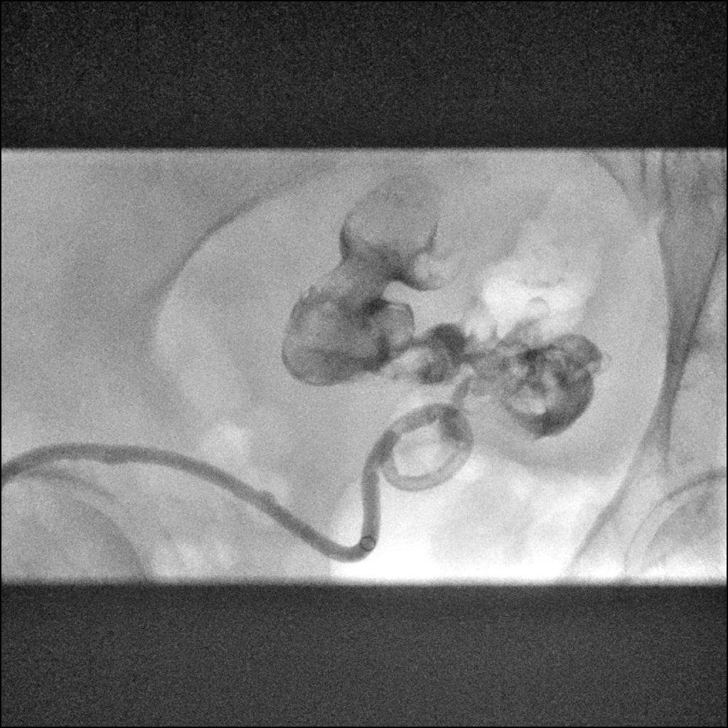

[8 of 8 positions shown; findings below may reference images not displayed]

Patient was initially seen in [REDACTED]
on 10/06/2016 (by Mattes. Cruz Mota) with CT imaging demonstrating
resolution of the pelvic abscess though subsequent drainage catheter
injection demonstrated persistent fistulous communication with the
adjacent sigmoid colon.

The patient returns today in [REDACTED]
for drainage catheter evaluation and management.

The patient is no longer flushing the percutaneous drainage
catheter. The patient reports approximately 10 cc of output per day
though was not maintain diligent records. No worsening abdominal or
pelvic pain. No fever or chills.

EXAM:
ABSCESS INJECTION
09/11/2016;
CT-guided left lower quadrant percutaneous drainage catheter
placement -09/12/2016

CONTRAST:  10 cc Omnipaque 300 - administered via the percutaneous
drainage catheter.

FLUOROSCOPY TIME:  30 seconds (67 mGy)
A preprocedural spot fluoroscopic image was obtained of the lower
pelvis and existing percutaneous drainage catheter and existing
percutaneous drainage catheter

Multiple spot fluoroscopic and radiographic images were obtained
following the injection of a small amount of contrast via the
existing percutaneous drainage catheter.

Images reviewed in the procedure was terminated. The catheter was
flushed with a small amount of saline and reconnected to a gravity
bag.
FINDINGS: Preprocedural spot fluoroscopic image demonstrates unchanged
positioning of percutaneous drainage catheter with and coiled and
locked over the midline of the lower pelvis.

Contrast injection demonstrates opacification of the decompressed
abscess cavity with brisk communication with the adjacent sigmoid
colon.
IMPRESSION: Persistent fistulous connection of the decompressed pelvic abscess
cavity and the adjacent sigmoid colon.

PLAN:
- Percutaneous drainage catheter to remain in place. The patient was
again instructed to not flush the percutaneous drainage catheter
though was encouraged to maintain diligent records regarding
drainage catheter output.

- The patient return to the [REDACTED] in 2
weeks with repeat percutaneous drainage catheter evaluation and
injection.

## 2017-09-04 DIAGNOSIS — S42291D Other displaced fracture of upper end of right humerus, subsequent encounter for fracture with routine healing: Secondary | ICD-10-CM | POA: Diagnosis not present

## 2017-09-05 ENCOUNTER — Telehealth: Payer: Self-pay | Admitting: Internal Medicine

## 2017-09-05 ENCOUNTER — Ambulatory Visit (HOSPITAL_BASED_OUTPATIENT_CLINIC_OR_DEPARTMENT_OTHER): Payer: Medicare Other | Admitting: Internal Medicine

## 2017-09-05 ENCOUNTER — Encounter: Payer: Self-pay | Admitting: Internal Medicine

## 2017-09-05 VITALS — BP 133/58 | HR 89 | Temp 97.2°F | Resp 17 | Ht 62.0 in | Wt 147.3 lb

## 2017-09-05 DIAGNOSIS — Z85118 Personal history of other malignant neoplasm of bronchus and lung: Secondary | ICD-10-CM

## 2017-09-05 DIAGNOSIS — M859 Disorder of bone density and structure, unspecified: Secondary | ICD-10-CM | POA: Diagnosis not present

## 2017-09-05 DIAGNOSIS — C3490 Malignant neoplasm of unspecified part of unspecified bronchus or lung: Secondary | ICD-10-CM

## 2017-09-05 NOTE — Progress Notes (Signed)
[place] [phone]   [phone]  OFFICE PROGRESS NOTE  [name], MD [address] [place] [address]  PRINCIPAL DIAGNOSIS: Limited stage small cell lung cancer diagnosed in March of 2010.   PRIOR THERAPY:  1. Status post 4 cycles of systemic chemotherapy with carboplatin and etoposide concurrent with radiation during cycle 2 and 3. The last dose of chemotherapy was given April 01, 2009. 2. Status post prophylactic cranial irradiation under the care of Dr. Lisbeth Renshaw completed May 19, 2009.  CURRENT THERAPY: Observation.  INTERVAL HISTORY: [name] 71 y.o. female returns to the clinic today for follow-up visit accompanied by her husband.  The patient was last seen in 2016.  She was discharged from the clinic at that time after she had no evidence for disease for more than 6 years.  The patient had a fall in August and she had comminuted proximal right humeral fracture.  Her husband also mentioned that the patient had CPR at some point with a lot of compression to the chest.  She is feeling fine today with no specific complaints.  She denied having any current chest pain except with cough, no shortness of breath or hemoptysis.  She denied having any recent weight loss or night sweats.  She has no nausea, vomiting, diarrhea or constipation.  She has been on observation for lung cancer since July 2010.  Her primary care physician ordered a CT scan of the chest on August 12, 2017 and that showed mixed sclerotic and lytic lesion within the sternum that is new compared to September 22, 2016 and worrisome for bony metastasis.  He referred her back to me for evaluation and recommendation regarding her condition.  MEDICAL HISTORY: Past Medical History:  Diagnosis Date  . Age-related nuclear cataract of both eyes 2016   +cortical age related cataracts OU   . Allergy    seasonal  . Arthritis    hands  . Bilateral diabetic retinopathy  (HCC) 2015   Dr. Zigmund Daniel  . Blood transfusion without reported diagnosis    when taking chemo  . Chronic combined systolic and diastolic congestive heart failure (HCC)    Takotsubo CM (resolved @5 /2018):  a. 08/2016 Echo: EF 30-35%, gr1DD, PASP 36mHg;  b. 11/2016 Echo: EF 40-45%, no rwma, nl RV fxn, mild TR, PASP 358mg.  . CKD (chronic kidney disease), stage II    GFR 60s  . 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  . Marland KitchenOPD (chronic obstructive pulmonary disease) ([facility]  . CVA (cerebral vascular accident) ([place]12/2017   MRI did show a left-sided small ischemic stroke which was acute but likely incidental (MRI was done b/c pt had TIA sx's in hospital).  Neuro put her on plavix at that time.  . Diabetes mellitus with complication (HCC)    diab retinopathy OU (laser)  . Diverticulitis 05/2016  . GERD (gastroesophageal reflux disease)    protonix  . History of diverticulitis of colon    with abscess; required IR percutaneous drain placed 09/2016.  . Marland Kitchenyperlipidemia   . Hypertension   . Left bundle branch block (LBBB) 12/16/2016  . [place]   non-small cell lung ca, stage III in 05/2011; systemic chemotherapy concurrent with radiation followed by prophylactic cranial irradiation and has been observation since July of 2010 with no evidence for disease recurrence-released from onc f/u 12/2014 (needs annual cxr by PCP).  CXR  08/2015 stable.  CT 07/2017 ? sternal met--ref back to Dr. Earlie Server.  . Mild cognitive impairment with memory loss    Likely from brain radiation therapy  . Non-obstructive CAD (coronary artery disease)    a. Cath 2006 preserved LV fxn, scattered irregularities without critical stenosis; b. 2008 stress echo negative for ischemia, but with hypertensive response; c. 08/2016 Cath: D1 25%, otw nl.  . Normocytic anemia 2018   Iron, vit B12, folate all normal 12/2016.  Marland Kitchen Open-angle glaucoma 2016   Dr. Shirley Muscat,  (bilateral)---responding to topical therapy  . Osteopenia 03/2016   03/2016 DEXA T-score -2.2  . Pneumonia    hospitalization 11/2016; hypoxemic resp failure--d/c'd home with home oxygen therapy  . Subclinical hyperthyroidism 2018  . Takotsubo cardiomyopathy    a. 08/2016 Echo: EF 30-35%, gr1DD, PASP 61mHg;  b. 08/2016 Cath: nonobs Dzs;  c. 11/2016 Echo: EF 40-45%, no rwma, nl RV fxn, mild TR, PASP 347mg.  ECHO 02/2017 EF 50-55%, grd II DD---essentially resolved Takotsubo 02/2017.  . Torsades de pointes (HCHend[place]11/2017   a. 08/2016 in setting of diverticulitis & pneumoperitoneum and Takotsubo CM -->prolonged QT, seen by EP-->avoid meds with potential for QT prolongation.    ALLERGIES:  is allergic to lactose intolerance (gi) and ibuprofen.  MEDICATIONS:  Current Outpatient Medications  Medication Sig Dispense Refill  . acetaminophen (TYLENOL) 500 MG tablet Take 500-1,000 mg by mouth every 6 (six) hours as needed for headache (or pain).     . Marland Kitchenlbuterol (PROAIR HFA) 108 (90 Base) MCG/ACT inhaler Inhale 2 puffs into the lungs every 6 (six) hours as needed for wheezing or shortness of breath. 18 g 3  . albuterol (PROVENTIL) (2.5 MG/3ML) 0.083% nebulizer solution USE ONE VIAL IN NEBULIZER EVERY 4 HOURS AS NEEDED FOR WHEEZING OR SHORTNESS OF BREATH 75 mL 3  . aspirin 81 MG chewable tablet Chew 81 mg by mouth daily.    . Marland Kitchentorvastatin (LIPITOR) 80 MG tablet Take 1 tablet (80 mg total) by mouth daily. 90 tablet 1  . cetirizine (ZYRTEC) 10 MG tablet Take 10 mg by mouth daily.    . clopidogrel (PLAVIX) 75 MG tablet TAKE ONE TABLET BY MOUTH ONCE DAILY 30 tablet 6  . escitalopram (LEXAPRO) 5 MG tablet Take 1 tablet (5 mg total) by mouth daily. 90 tablet 3  . fluticasone (FLOVENT HFA) 220 MCG/ACT inhaler Inhale 2 puffs into the lungs 2 (two) times daily. 12 g 6  . furosemide (LASIX) 40 MG tablet Take 0.5 tablets (20 mg total) by mouth daily. 30 tablet 6  . glucose blood test strip Use to check blood sugar  three times daily 100 each 12  . HUMULIN 70/30 KWIKPEN (70-30) 100 UNIT/ML PEN Per sliding scale, takes this qAM and aPM:  140-199 - 4 units, 200-250 - 6 units, 251-299 - 8 units,  300-349 - 10 units,  350 or above 12 units. Dispense syringes and needles as needed, Ok to switch to PEN if approved. Substitute to any brand approved. DX DM2, Code E11.65 15 mL 6  . HYDROcodone-acetaminophen (NORCO/VICODIN) 5-325 MG tablet Take 2 tablets by mouth every 4 (four) hours as needed. 10 tablet 0  . metoprolol tartrate (LOPRESSOR) 25 MG tablet TAKE 25 MG ( 1 TABLET)  IN THE MORNING , TAKE 50 MG ( 2 TABLETS) AT BEDTIME 270 tablet 3  . nitroGLYCERIN (NITROSTAT) 0.4 MG SL tablet Place 1 tablet (0.4 mg total) under the tongue every 5 (five) minutes as needed. For chest pain 50 tablet  3  . ONE TOUCH ULTRA TEST test strip USE  STRIP TO CHECK GLUCOSE THREE TIMES DAILY 100 each 11  . ONETOUCH DELICA LANCETS 89F MISC TEST 3 TIMES A DAY 100 each 11  . pantoprazole (PROTONIX) 40 MG tablet Take 1 tablet (40 mg total) by mouth daily. 90 tablet 3  . potassium chloride (K-DUR) 10 MEQ tablet TAKE 1 TABLET BY MOUTH ONCE DAILY 90 tablet 1   No current facility-administered medications for this visit.     SURGICAL HISTORY:  Past Surgical History:  Procedure Laterality Date  . ABDOMINAL HYSTERECTOMY  1997  . APPENDECTOMY    . CARPAL TUNNEL RELEASE    . CATARACT EXTRACTION    . CHOLECYSTECTOMY  2002  . COLONOSCOPY  10/24/00   normal.  [employer] hemoccult testing via [place] 06/18/15 was NEG  . IR GENERIC HISTORICAL  09/19/2016   IR SINUS/FIST TUBE CHK-NON GI 09/19/2016 Corrie Mckusick, DO [facility]  . IR GENERIC HISTORICAL  10/06/2016   IR RADIOLOGIST EVAL & MGMT 10/06/2016 [facility]  . IR GENERIC HISTORICAL  10/26/2016   IR RADIOLOGIST EVAL & MGMT 10/26/2016 [facility]  . IR GENERIC HISTORICAL  11/03/2016   IR RADIOLOGIST EVAL & MGMT 11/03/2016 Ardis Rowan, PA-C [facility]  . IR GENERIC HISTORICAL   11/24/2016   IR RADIOLOGIST EVAL & MGMT 11/24/2016 Ardis Rowan, PA-C [facility]  . IR GENERIC HISTORICAL  12/05/2016   Fistula smaller/improving.  IR SINUS/FIST TUBE CHK-NON GI 12/05/2016 Sandi Mariscal, MD [facility]  . IR GENERIC HISTORICAL  12/22/2016   IR RADIOLOGIST EVAL & MGMT 12/22/2016 Greggory Keen, MD [facility]  . IR RADIOLOGIST EVAL & MGMT  01/10/2017  . IR RADIOLOGIST EVAL & MGMT  02/09/2017  . IR SINUS/FIST TUBE CHK-NON GI  03/28/2017  . TRANSTHORACIC ECHOCARDIOGRAM  09/14/2016; 11/2016; 02/2017   08/2016: EF 30-35 %, Akinesis of the mid-apicalanteroseptal myocardium.  Grade I DD.  Mild pulm HTN.  Repeat echo 11/2016, EF 40-45%, no rwma, nl RV fxn, mild TR, PASP 11mHg.  02/2017 EF 50-55%, grade II DD    REVIEW OF SYSTEMS:  Constitutional: negative Eyes: negative Ears, nose, mouth, throat, and face: negative Respiratory: positive for cough Cardiovascular: negative Gastrointestinal: negative Genitourinary:negative Integument/breast: negative Hematologic/lymphatic: negative Musculoskeletal:negative Neurological: negative Behavioral/Psych: negative Endocrine: negative Allergic/Immunologic: negative   PHYSICAL EXAMINATION: General appearance: alert, cooperative and no distress Head: Normocephalic, without obvious abnormality, atraumatic Neck: no adenopathy Lymph nodes: Cervical, supraclavicular, and axillary nodes normal. Resp: clear to auscultation bilaterally Back: symmetric, no curvature. ROM normal. No CVA tenderness. Cardio: regular rate and rhythm, S1, S2 normal, no murmur, click, rub or gallop GI: soft, non-tender; bowel sounds normal; no masses,  no organomegaly Extremities: extremities normal, atraumatic, no cyanosis or edema Neurologic: Alert and oriented X 3, normal strength and tone. Normal symmetric reflexes. Normal coordination and gait  ECOG PERFORMANCE STATUS: 1 - Symptomatic but completely ambulatory  Blood pressure (!) 133/58, pulse 89,  temperature (!) 97.2 F (36.2 C), temperature source Oral, resp. rate 17, height 5' 2"  (1.575 m), weight 147 lb 4.8 oz (66.8 kg), SpO2 100 %.  LABORATORY DATA: Lab Results  Component Value Date   WBC 7.0 04/20/2017   HGB 11.0 (L) 04/20/2017   HCT 33.8 (L) 04/20/2017   MCV 81.9 04/20/2017   PLT 285.0 04/20/2017      Chemistry      Component Value Date/Time   NA 139 08/10/2017 1044   NA  141 01/15/2015 0848   K 4.4 08/10/2017 1044   K 4.0 01/15/2015 0848   CL 101 08/10/2017 1044   CL 105 02/18/2013 0812   CO2 32 08/10/2017 1044   CO2 25 01/15/2015 0848   BUN 13 08/10/2017 1044   BUN 9.0 01/15/2015 0848   CREATININE 0.92 08/10/2017 1044   CREATININE 0.7 01/15/2015 0848      Component Value Date/Time   CALCIUM 9.3 08/10/2017 1044   CALCIUM 9.2 01/15/2015 0848   ALKPHOS 99 04/20/2017 0848   ALKPHOS 92 01/15/2015 0848   AST 14 04/20/2017 0848   AST 19 01/15/2015 0848   ALT 10 04/20/2017 0848   ALT 20 01/15/2015 0848   BILITOT 0.3 04/20/2017 0848   BILITOT 0.34 01/15/2015 0848       RADIOGRAPHIC STUDIES: Ct Chest W Contrast  Result Date: 08/12/2017 CLINICAL DATA:  71 year old female with history of right lung cancer and radiation therapy. Right paratracheal opacity on prior chest radiographs. EXAM: CT CHEST WITH CONTRAST TECHNIQUE: Multidetector CT imaging of the chest was performed during intravenous contrast administration. CONTRAST:  42m ISOVUE-300 IOPAMIDOL (ISOVUE-300) INJECTION 61% COMPARISON:  06/10/2017 and prior chest radiographs. FINDINGS: Cardiovascular: A moderate right pleural effusion has slightly increased since 09/22/2016. Heart size normal. Thoracic aortic atherosclerotic calcifications noted without aneurysm. Mediastinum/Nodes: No enlarged lymph nodes identified. No mediastinal mass or suspicious thyroid abnormalities. Lungs/Pleura: Medial right upper lung and apical consolidation/soft tissue is unchanged from 2016 and compatible with post  treatment/radiation changes. A tiny amount of adjacent pleural fluid is again noted. No new pulmonary opacities are identified. There is no evidence of suspicious nodule, airspace disease, consolidation, left pleural effusion or pneumothorax. Upper Abdomen: No acute abnormality Musculoskeletal: A mixed sclerotic and lytic lesion within the sternum is new since 09/22/2016 and worrisome for a bony metastasis. A remote comminuted fracture of the proximal right humerus is again identified. Within the sternum the is noted. IMPRESSION: 1. New 2.5 cm sternal lesion since 08/2016 worrisome for bony metastasis. 2. Stable post treatment changes/scarring within the paramedian right upper lung and apex. 3. Slight increase in moderate pericardial effusion since 09/22/2016. 4. No new or suspicious pulmonary opacities since 2016. 5.  Aortic Atherosclerosis (ICD10-I70.0). Electronically Signed   By: JMargarette CanadaM.D.   On: 08/12/2017 11:18   ASSESSMENT AND PLAN: This is a very pleasant 71years old white female with history of limited stage small cell lung cancer status post systemic chemotherapy concurrent with radiation followed by prophylactic cranial irradiation and has been observation since July of 2010 with no evidence for disease recurrence. She had a trauma recently including comminuted fracture of the proximal right humerus the patient also had compression of the chest for CPR several months ago.  Her recent CT scan showed questionable sternal lytic and sclerotic lesion suspicious for bone metastasis.  I think this is most likely secondary to her previous trauma but I cannot exclude any metastatic disease at this point. I discussed with the patient several options for management of her condition including close observation and monitoring versus proceeding with CT guided core biopsy of the sternal lesion to rule out any disease recurrence.  The patient is a little bit anxious and she would like to proceed with the  biopsy. I would see her back for follow-up visit in 2-3 weeks for reevaluation and discussion of the biopsy results and further recommendation regarding her condition. She was advised to call immediately if she has any concerning symptoms in the interval. All  questions were answered. The patient knows to call the clinic with any problems, questions or concerns. We can certainly see the patient much sooner if necessary. I spent 15 minutes counseling the patient face to face. The total time spent in the appointment was 25 minutes.  Disclaimer: This note was dictated with voice recognition software. Similar sounding words can inadvertently be transcribed and may not be corrected upon review.

## 2017-09-05 NOTE — Telephone Encounter (Signed)
Gave avs and calendar for November  °

## 2017-09-06 ENCOUNTER — Encounter: Payer: Self-pay | Admitting: Family Medicine

## 2017-09-06 ENCOUNTER — Ambulatory Visit (INDEPENDENT_AMBULATORY_CARE_PROVIDER_SITE_OTHER): Payer: Medicare Other | Admitting: Ophthalmology

## 2017-09-06 DIAGNOSIS — H35033 Hypertensive retinopathy, bilateral: Secondary | ICD-10-CM

## 2017-09-06 DIAGNOSIS — E11311 Type 2 diabetes mellitus with unspecified diabetic retinopathy with macular edema: Secondary | ICD-10-CM

## 2017-09-06 DIAGNOSIS — E113513 Type 2 diabetes mellitus with proliferative diabetic retinopathy with macular edema, bilateral: Secondary | ICD-10-CM

## 2017-09-06 DIAGNOSIS — H43813 Vitreous degeneration, bilateral: Secondary | ICD-10-CM | POA: Diagnosis not present

## 2017-09-06 DIAGNOSIS — I10 Essential (primary) hypertension: Secondary | ICD-10-CM

## 2017-09-06 LAB — HM DIABETES EYE EXAM

## 2017-09-06 IMAGING — RF DG SINUS / FISTULA TRACT / ABSCESSOGRAM
2 series · 5 of 5 positions shown · non-contrast
Comparison: none

CLINICAL DATA: Diverticulitis of large intestine with abscess,
unspecified bleeding status

EXAM:
ABSCESS DRAIN CATHETER INJECTION
TECHNIQUE: The procedure, risks (including but not limited to bleeding,
infection, organ damage ), benefits, and alternatives were explained
to the patient. Questions regarding the procedure were encouraged
and answered. The patient understands and consents to the procedure.
Survey fluoroscopic inspection reveals stable position of the
abscess drain catheter.
Injection demonstrates persistent fistula to the sigmoid colon.

[Series 1: one shot · 1 of 1 slices shown]
[im 1/1]
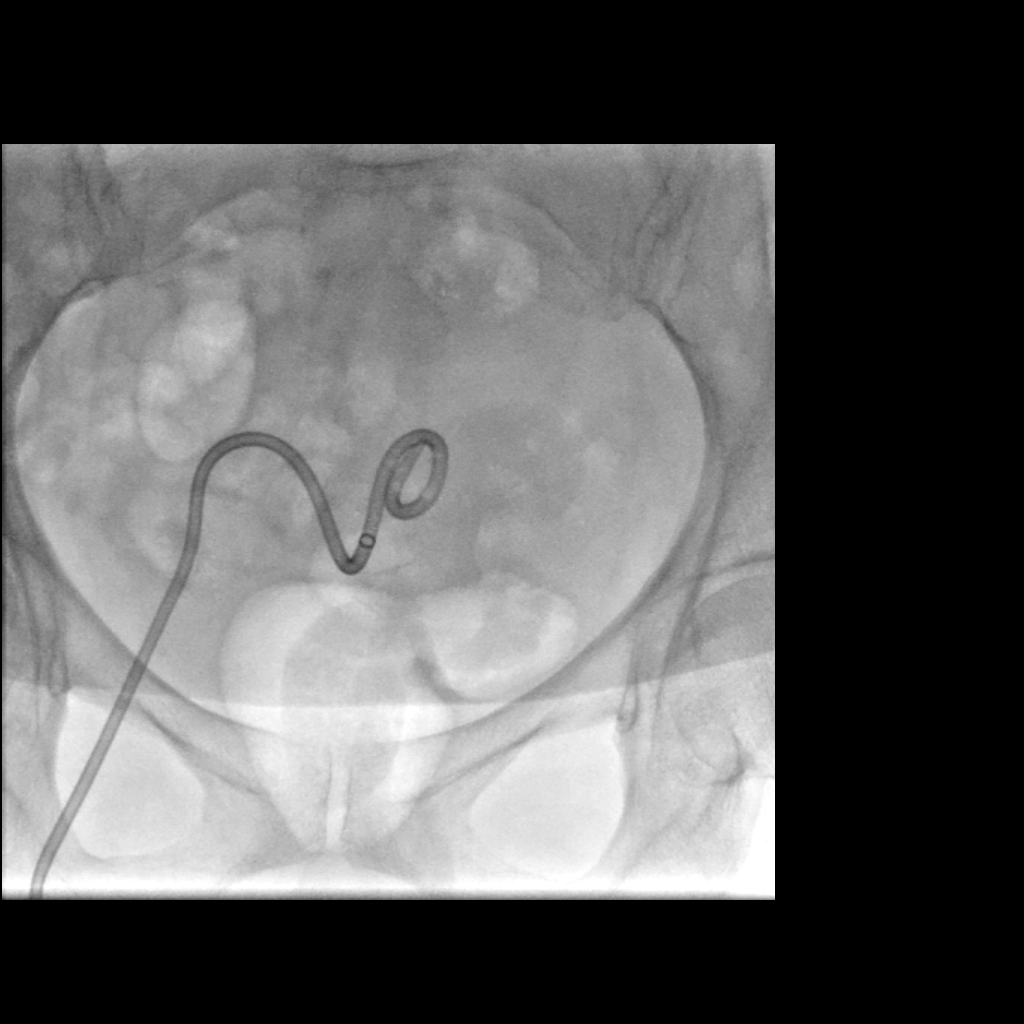

[Series 2: sequence · 4 of 112 frames shown]
[frame 17/112]
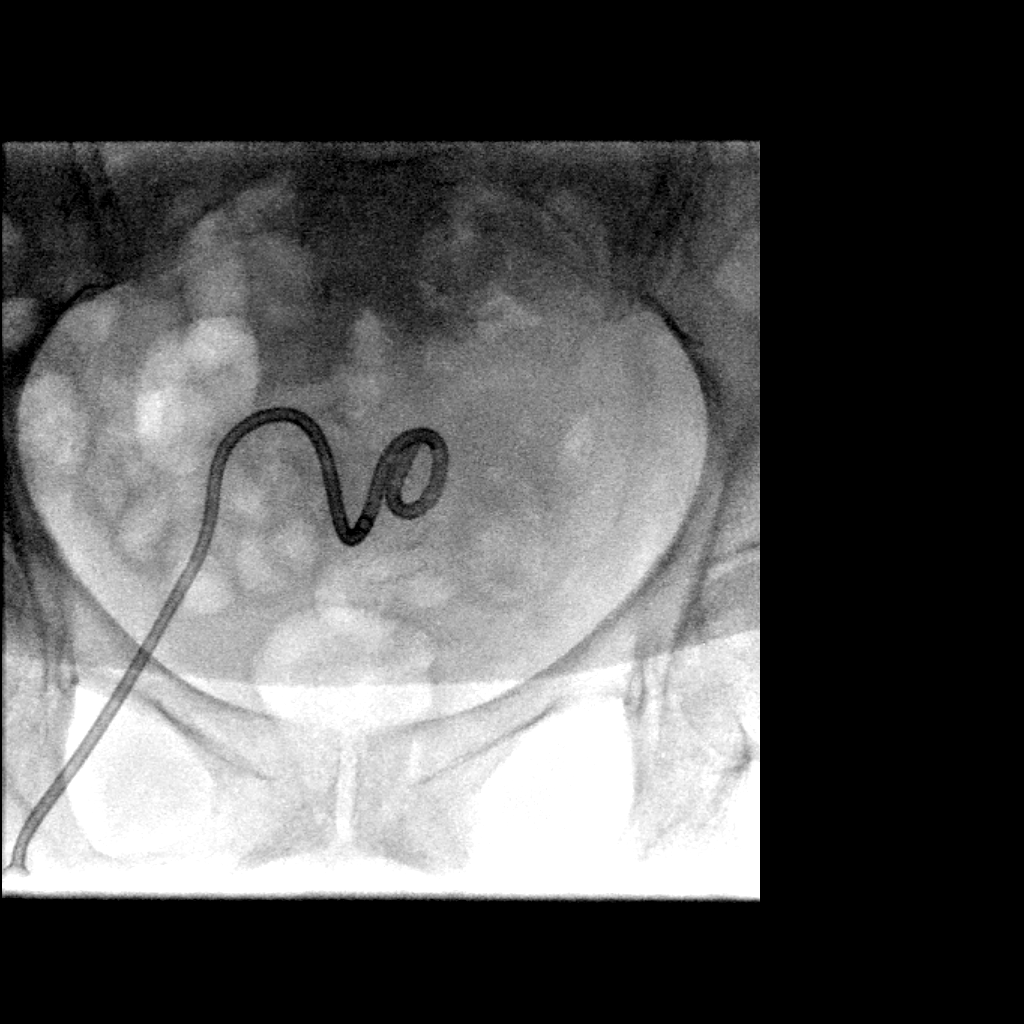
[frame 57/112]
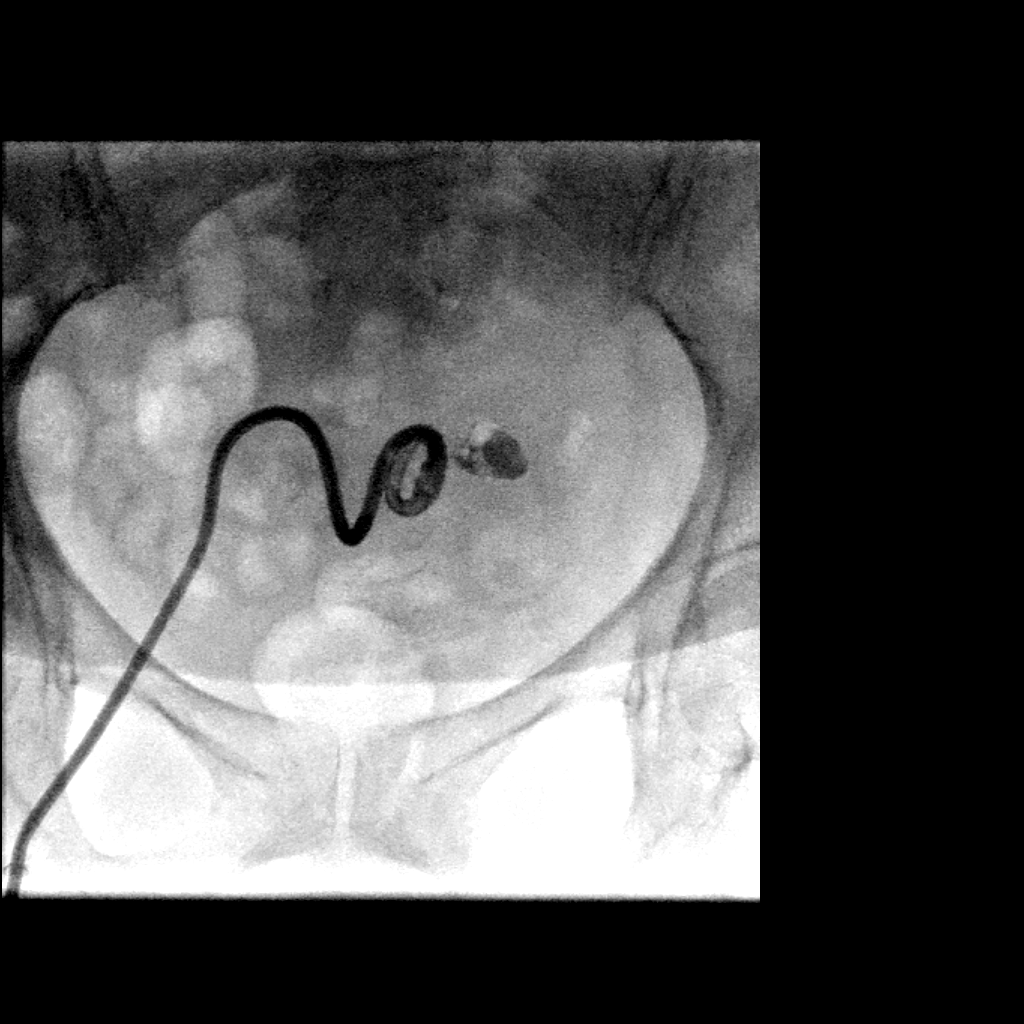
[frame 96/112]
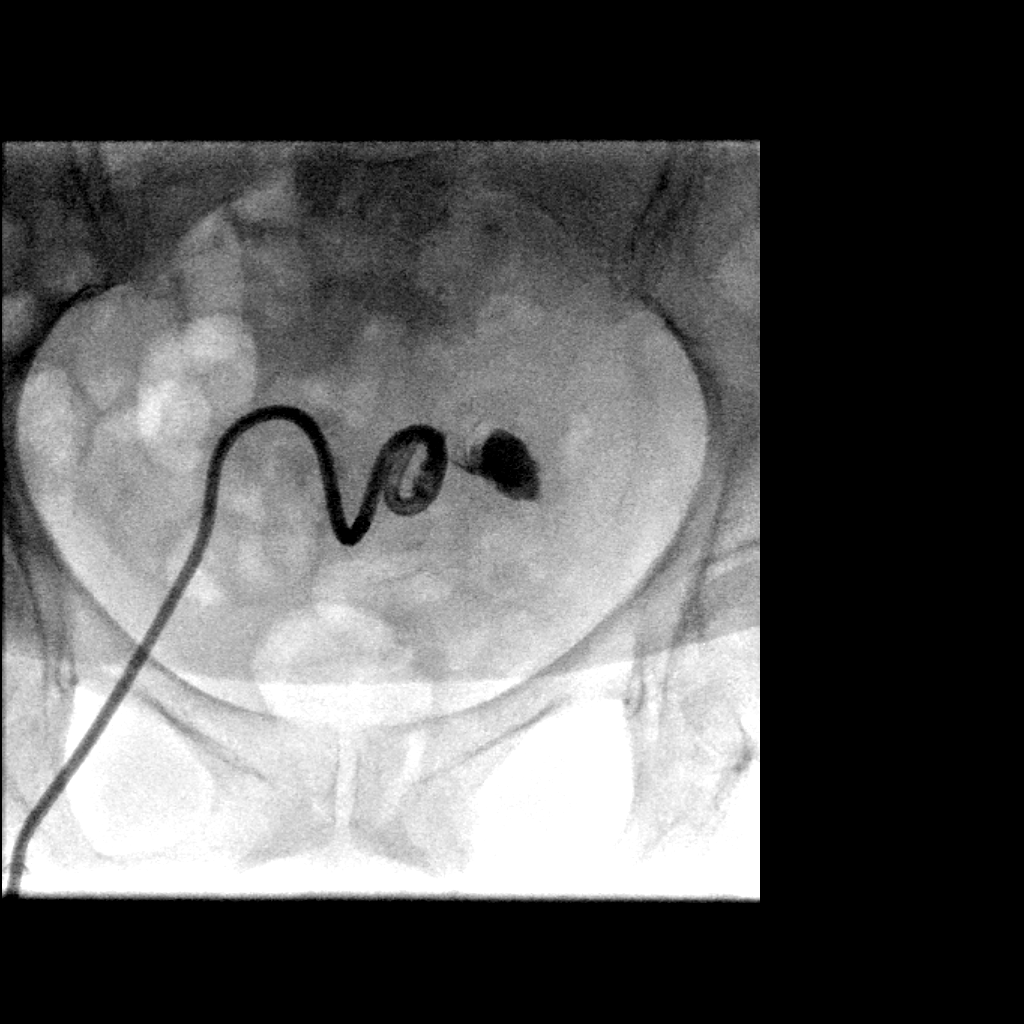
[frame 106/112]
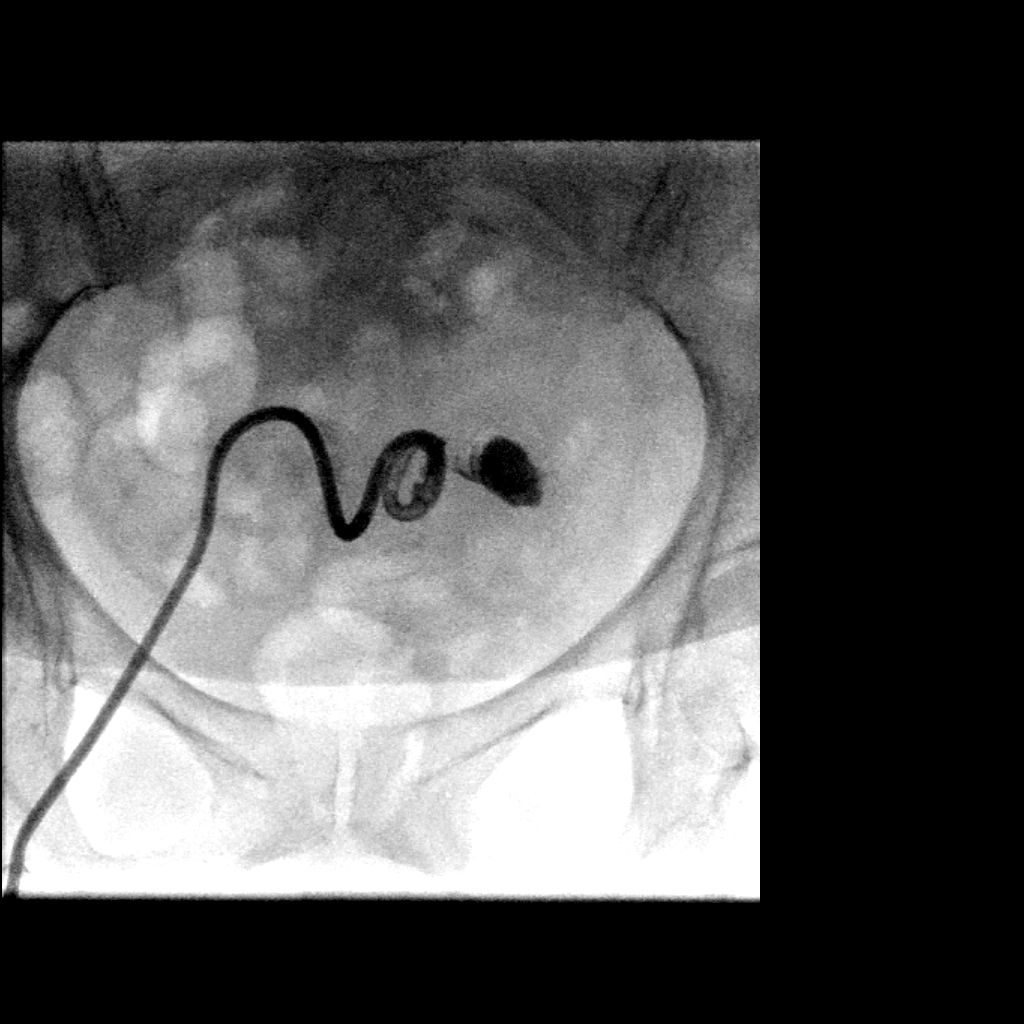

[5 of 5 positions shown; findings below may reference images not displayed]

IMPRESSION: 1. Persistent fistula to the sigmoid colon.
Keep drain catheter in place, to gravity drain bag. Flush 3-5 mL
sterile saline daily. Return for repeat injection in 3 weeks.
Schedule outpatient surgical consultation.

## 2017-09-11 ENCOUNTER — Other Ambulatory Visit: Payer: Self-pay | Admitting: Radiology

## 2017-09-12 ENCOUNTER — Encounter (HOSPITAL_COMMUNITY): Payer: Self-pay

## 2017-09-12 ENCOUNTER — Ambulatory Visit (HOSPITAL_COMMUNITY)
Admission: RE | Admit: 2017-09-12 | Discharge: 2017-09-12 | Disposition: A | Discharge status: Home / Self Care | Payer: Medicare Other | Source: Ambulatory Visit | Attending: Internal Medicine | Admitting: Internal Medicine

## 2017-09-12 DIAGNOSIS — Z841 Family history of disorders of kidney and ureter: Secondary | ICD-10-CM | POA: Insufficient documentation

## 2017-09-12 DIAGNOSIS — Z794 Long term (current) use of insulin: Secondary | ICD-10-CM | POA: Insufficient documentation

## 2017-09-12 DIAGNOSIS — E785 Hyperlipidemia, unspecified: Secondary | ICD-10-CM | POA: Diagnosis not present

## 2017-09-12 DIAGNOSIS — Z886 Allergy status to analgesic agent status: Secondary | ICD-10-CM | POA: Insufficient documentation

## 2017-09-12 DIAGNOSIS — Z8249 Family history of ischemic heart disease and other diseases of the circulatory system: Secondary | ICD-10-CM | POA: Insufficient documentation

## 2017-09-12 DIAGNOSIS — E11319 Type 2 diabetes mellitus with unspecified diabetic retinopathy without macular edema: Secondary | ICD-10-CM | POA: Insufficient documentation

## 2017-09-12 DIAGNOSIS — E1122 Type 2 diabetes mellitus with diabetic chronic kidney disease: Secondary | ICD-10-CM | POA: Insufficient documentation

## 2017-09-12 DIAGNOSIS — M799 Soft tissue disorder, unspecified: Secondary | ICD-10-CM | POA: Diagnosis not present

## 2017-09-12 DIAGNOSIS — E739 Lactose intolerance, unspecified: Secondary | ICD-10-CM | POA: Diagnosis not present

## 2017-09-12 DIAGNOSIS — Z9849 Cataract extraction status, unspecified eye: Secondary | ICD-10-CM | POA: Insufficient documentation

## 2017-09-12 DIAGNOSIS — I5042 Chronic combined systolic (congestive) and diastolic (congestive) heart failure: Secondary | ICD-10-CM | POA: Diagnosis not present

## 2017-09-12 DIAGNOSIS — Z9049 Acquired absence of other specified parts of digestive tract: Secondary | ICD-10-CM | POA: Insufficient documentation

## 2017-09-12 DIAGNOSIS — K219 Gastro-esophageal reflux disease without esophagitis: Secondary | ICD-10-CM | POA: Insufficient documentation

## 2017-09-12 DIAGNOSIS — Z79899 Other long term (current) drug therapy: Secondary | ICD-10-CM | POA: Diagnosis not present

## 2017-09-12 DIAGNOSIS — E0590 Thyrotoxicosis, unspecified without thyrotoxic crisis or storm: Secondary | ICD-10-CM | POA: Diagnosis not present

## 2017-09-12 DIAGNOSIS — C3490 Malignant neoplasm of unspecified part of unspecified bronchus or lung: Secondary | ICD-10-CM | POA: Insufficient documentation

## 2017-09-12 DIAGNOSIS — M8580 Other specified disorders of bone density and structure, unspecified site: Secondary | ICD-10-CM | POA: Diagnosis not present

## 2017-09-12 DIAGNOSIS — Z7982 Long term (current) use of aspirin: Secondary | ICD-10-CM | POA: Diagnosis not present

## 2017-09-12 DIAGNOSIS — Z87891 Personal history of nicotine dependence: Secondary | ICD-10-CM | POA: Diagnosis not present

## 2017-09-12 DIAGNOSIS — I447 Left bundle-branch block, unspecified: Secondary | ICD-10-CM | POA: Diagnosis not present

## 2017-09-12 DIAGNOSIS — Z9889 Other specified postprocedural states: Secondary | ICD-10-CM | POA: Insufficient documentation

## 2017-09-12 DIAGNOSIS — Z90710 Acquired absence of both cervix and uterus: Secondary | ICD-10-CM | POA: Insufficient documentation

## 2017-09-12 DIAGNOSIS — I272 Pulmonary hypertension, unspecified: Secondary | ICD-10-CM | POA: Diagnosis not present

## 2017-09-12 DIAGNOSIS — G3184 Mild cognitive impairment, so stated: Secondary | ICD-10-CM | POA: Diagnosis not present

## 2017-09-12 DIAGNOSIS — Z8673 Personal history of transient ischemic attack (TIA), and cerebral infarction without residual deficits: Secondary | ICD-10-CM | POA: Insufficient documentation

## 2017-09-12 DIAGNOSIS — Z85118 Personal history of other malignant neoplasm of bronchus and lung: Secondary | ICD-10-CM | POA: Diagnosis not present

## 2017-09-12 DIAGNOSIS — J449 Chronic obstructive pulmonary disease, unspecified: Secondary | ICD-10-CM | POA: Diagnosis not present

## 2017-09-12 DIAGNOSIS — I130 Hypertensive heart and chronic kidney disease with heart failure and stage 1 through stage 4 chronic kidney disease, or unspecified chronic kidney disease: Secondary | ICD-10-CM | POA: Insufficient documentation

## 2017-09-12 DIAGNOSIS — Z7902 Long term (current) use of antithrombotics/antiplatelets: Secondary | ICD-10-CM | POA: Insufficient documentation

## 2017-09-12 DIAGNOSIS — Z833 Family history of diabetes mellitus: Secondary | ICD-10-CM | POA: Insufficient documentation

## 2017-09-12 DIAGNOSIS — R222 Localized swelling, mass and lump, trunk: Secondary | ICD-10-CM | POA: Diagnosis not present

## 2017-09-12 DIAGNOSIS — M898X8 Other specified disorders of bone, other site: Secondary | ICD-10-CM | POA: Diagnosis not present

## 2017-09-12 DIAGNOSIS — I2510 Atherosclerotic heart disease of native coronary artery without angina pectoris: Secondary | ICD-10-CM | POA: Diagnosis not present

## 2017-09-12 DIAGNOSIS — N182 Chronic kidney disease, stage 2 (mild): Secondary | ICD-10-CM | POA: Diagnosis not present

## 2017-09-12 DIAGNOSIS — Z955 Presence of coronary angioplasty implant and graft: Secondary | ICD-10-CM | POA: Insufficient documentation

## 2017-09-12 LAB — CBC
HEMATOCRIT: 36.6 % (ref 36.0–46.0)
Hemoglobin: 11.7 g/dL — ABNORMAL LOW (ref 12.0–15.0)
MCH: 27.8 pg (ref 26.0–34.0)
MCHC: 32 g/dL (ref 30.0–36.0)
MCV: 86.9 fL (ref 78.0–100.0)
PLATELETS: 184 10*3/uL (ref 150–400)
RBC: 4.21 MIL/uL (ref 3.87–5.11)
RDW: 15.9 % — AB (ref 11.5–15.5)
WBC: 7.2 10*3/uL (ref 4.0–10.5)

## 2017-09-12 LAB — PROTIME-INR
INR: 1.02
Prothrombin Time: 13.3 seconds (ref 11.4–15.2)

## 2017-09-12 LAB — APTT: aPTT: 29 seconds (ref 24–36)

## 2017-09-12 LAB — GLUCOSE, CAPILLARY: Glucose-Capillary: 130 mg/dL — ABNORMAL HIGH (ref 65–99)

## 2017-09-12 MED ORDER — MIDAZOLAM HCL 2 MG/2ML IJ SOLN
INTRAMUSCULAR | Status: AC | PRN
  Administered 2017-09-12 (×2): 1 mg via INTRAVENOUS

## 2017-09-12 MED ORDER — MIDAZOLAM HCL 2 MG/2ML IJ SOLN
INTRAMUSCULAR | Status: AC
  Filled 2017-09-12: qty 4

## 2017-09-12 MED ORDER — FENTANYL CITRATE (PF) 100 MCG/2ML IJ SOLN
INTRAMUSCULAR | Status: AC | PRN
  Administered 2017-09-12: 50 ug via INTRAVENOUS
  Administered 2017-09-12: 25 ug via INTRAVENOUS

## 2017-09-12 MED ORDER — SODIUM CHLORIDE 0.9 % IV SOLN
INTRAVENOUS | Status: DC
  Administered 2017-09-12: 11:00:00 via INTRAVENOUS

## 2017-09-12 MED ORDER — LIDOCAINE HCL 1 % IJ SOLN
INTRAMUSCULAR | Status: AC
  Filled 2017-09-12: qty 20

## 2017-09-12 MED ORDER — FENTANYL CITRATE (PF) 100 MCG/2ML IJ SOLN
INTRAMUSCULAR | Status: AC
  Filled 2017-09-12: qty 4

## 2017-09-12 NOTE — Discharge Instructions (Signed)

## 2017-09-12 NOTE — Procedures (Signed)
Interventional Radiology Procedure Note  Procedure: CT guided biopsy of sternal lesion  Complications: None  Estimated Blood Loss: < 10 mL  Findings: 10 G bone cutting needle advanced into sternum at level of lytic process.  Aspiration via coaxial 14 G needle yielded thin fluid.  Sample sent for cytology.  Core sample of inner marrow space performed via outer needle.  Venetia Night. Kathlene Cote, M.D Pager:  [phone]

## 2017-09-12 NOTE — Sedation Documentation (Addendum)
Per Dr. Kathlene Cote, pt's ASA is 2 and airway is 1.

## 2017-09-12 NOTE — H&P (Signed)
Chief Complaint: Patient was seen in consultation today for sternal lesion biopsy at the request of [facility]  Referring Physician(s): Mohamed,Mohamed  Supervising Physician: Aletta Edouard  Patient Status: [facility] - Out-pt  History of Present Illness: [name] is a 71 y.o. female   Pt with Hx Lung Ca Dx 2010- was released from Oncology 2016 - 6 years without recurrence Was hospitalized a yr ago for illness and  actually has cardiac arrest and needed CPR and of course survived.  3 months ago suffered fall at home; fractured Rt arm During work up- noted sternal lesion CT 08/12/17: IMPRESSION: 1. New 2.5 cm sternal lesion since 08/2016 worrisome for bony metastasis. 2. Stable post treatment changes/scarring within the paramedian right upper lung and apex. 3. Slight increase in moderate pericardial effusion since 09/22/2016. 4. No new or suspicious pulmonary opacities since 2016. 5.  Aortic Atherosclerosis (ICD10-I70.0).  Scheduled now for biopsy of same  Past Medical History:  Diagnosis Date  . Age-related nuclear cataract of both eyes 2016   +cortical age related cataracts OU   . Allergy    seasonal  . Arthritis    hands  . Bilateral diabetic retinopathy (HCC) 2015   Dr. Zigmund Daniel  . Blood transfusion without reported diagnosis    when taking chemo  . Chronic combined systolic and diastolic congestive heart failure (HCC)    Takotsubo CM (resolved @5 /2018):  a. 08/2016 Echo: EF 30-35%, gr1DD, PASP 61mHg;  b. 11/2016 Echo: EF 40-45%, no rwma, nl RV fxn, mild TR, PASP 319mg.  . CKD (chronic kidney disease), stage II    GFR 60s  . 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  . Marland KitchenOPD (chronic obstructive pulmonary disease) ([facility]  . CVA (cerebral vascular accident) ([place]   MRI did show a left-sided small ischemic stroke which was acute but likely incidental (MRI was done b/c pt had TIA sx's in hospital).  Neuro  put her on plavix at that time.  . Diabetes mellitus with complication (HCC)    diab retinopathy OU (laser)  . Diverticulitis 05/2016  . GERD (gastroesophageal reflux disease)    protonix  . History of diverticulitis of colon    with abscess; required IR percutaneous drain placed 09/2016.  . Marland Kitchenyperlipidemia   . Hypertension   . Left bundle branch block (LBBB) 12/16/2016  . Lung cancer ([facility]   non-small cell lung ca, stage III in 05/2011; systemic chemotherapy concurrent with radiation followed by prophylactic cranial irradiation and has been observation since July of 2010 with no evidence for disease recurrence-released from onc f/u 12/2014 (needs annual cxr by PCP).  CXR 08/2015 stable.  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 MoEarlie Servero bx this.  . Mild cognitive impairment with memory loss    Likely from brain radiation therapy  . Non-obstructive CAD (coronary artery disease)    a. Cath 2006 preserved LV fxn, scattered irregularities without critical stenosis; b. 2008 stress echo negative for ischemia, but with hypertensive response; c. 08/2016 Cath: D1 25%, otw nl.  . Normocytic anemia 2018   Iron, vit B12, folate all normal 12/2016.  . Marland Kitchenpen-angle glaucoma 2016   Dr. BeShirley Muscat(bilateral)---responding to topical therapy  . Osteopenia 03/2016   03/2016 DEXA T-score -2.2  . Pneumonia    hospitalization 11/2016; hypoxemic resp failure--d/c'd home with home oxygen therapy  . Subclinical hyperthyroidism 2018  . Takotsubo cardiomyopathy    a. 08/2016 Echo: EF 30-35%, gr1DD, PASP 3833m;  b. 08/2016  Cath: nonobs Dzs;  c. 11/2016 Echo: EF 40-45%, no rwma, nl RV fxn, mild TR, PASP 11mHg.  ECHO 02/2017 EF 50-55%, grd II DD---essentially resolved Takotsubo 02/2017.  . Torsades de pointes (HTryon 08/2016   a. 08/2016 in setting of diverticulitis & pneumoperitoneum and Takotsubo CM -->prolonged QT, seen by EP-->avoid meds with potential for QT prolongation.    Past Surgical History:  Procedure Laterality  Date  . ABDOMINAL HYSTERECTOMY  1997  . APPENDECTOMY    . CARPAL TUNNEL RELEASE    . CATARACT EXTRACTION    . CHOLECYSTECTOMY  2002  . COLONOSCOPY  10/24/00   normal.  [facility] [place] was NEG  . IR GENERIC HISTORICAL  09/19/2016   IR SINUS/FIST TUBE CHK-NON GI 09/19/2016 JCorrie Mckusick DO [facility]INTERV RAD  . IR GENERIC HISTORICAL  10/06/2016   IR RADIOLOGIST EVAL & MGMT 10/06/2016 [facility] INTER[facility] RAD  . IR GENERIC HISTORICAL  10/26/2016   IR RADIOLOGIST EVAL & MGMT 10/26/2016 [facility] INTER[facility]  . IR GENERIC HISTORICAL  11/03/2016   IR RADIOLOGIST EVAL & MGMT 11/03/2016 WArdis Rowan PA-C [facility] INTERV RAD  . IR GENERIC HISTORICAL  11/24/2016   IR RADIOLOGIST EVAL & MGMT 11/24/2016 WArdis Rowan PA-C [facility] INTER[facility] RAD  . IR GENERIC HISTORICAL  12/05/2016   Fistula smaller/improving.  IR SINUS/FIST TUBE CHK-NON GI 12/05/2016 JSandi Mariscal MD [facility]INTER[facility]  . IR GENERIC HISTORICAL  12/22/2016   IR RADIOLOGIST EVAL & MGMT 12/22/2016 MGreggory Keen MD [facility] INTERV RAD  . IR RADIOLOGIST EVAL & MGMT  01/10/2017  . IR RADIOLOGIST EVAL & MGMT  02/09/2017  . IR SINUS/FIST TUBE CHK-NON GI  03/28/2017  . Left Heart Cath and Coronary Angiography N/A 09/14/2016   [facility]  . LEFT HEART CATHETERIZATION WITH CORONARY ANGIOGRAM N/A 05/30/2012   [facility]  . TRANSTHORACIC ECHOCARDIOGRAM  09/14/2016; 11/2016; 02/2017   08/2016: EF 30-35 %, Akinesis of the mid-apicalanteroseptal myocardium.  Grade I DD.  Mild pulm HTN.  Repeat echo 11/2016, EF 40-45%, no rwma, nl RV fxn, mild TR, PASP 316mg.  02/2017 EF 50-55%, grade II DD    Allergies: Lactose intolerance (gi) and Ibuprofen  Medications: Prior to Admission medications   Medication Sig Start Date End Date Taking? Authorizing Provider  albuterol (PROAIR HFA) 108 (90 Base) MCG/ACT inhaler Inhale 2 puffs into the lungs every 6 (six) hours as needed  for wheezing or shortness of breath. 02/16/17  Yes McGowen, PhAdrian BlackwaterMD  albuterol (PROVENTIL) (2.5 MG/3ML) 0.083% nebulizer solution USE ONE VIAL IN NEBULIZER EVERY 4 HOURS AS NEEDED FOR WHEEZING OR SHORTNESS OF BREATH 05/05/17  Yes McGowen, PhAdrian BlackwaterMD  aspirin 81 MG chewable tablet Chew 81 mg by mouth daily.   Yes [provider]  atorvastatin (LIPITOR) 80 MG tablet Take 1 tablet (80 mg total) by mouth daily. 05/03/17  Yes McGowen, PhAdrian BlackwaterMD  cetirizine (ZYRTEC) 10 MG tablet Take 10 mg by mouth daily.   Yes [provider]  clopidogrel (PLAVIX) 75 MG tablet TAKE ONE TABLET BY MOUTH ONCE DAILY 06/27/17  Yes McGowen, PhAdrian BlackwaterMD  escitalopram (LEXAPRO) 5 MG tablet Take 1 tablet (5 mg total) by mouth daily. 08/10/17  Yes McGowen, PhAdrian BlackwaterMD  fluticasone (FLOVENT HFA) 220 MCG/ACT inhaler Inhale 2 puffs into the lungs 2 (two) times daily. 02/16/17  Yes McGowen, PhAdrian BlackwaterMD  furosemide (LASIX) 40 MG tablet Take 0.5 tablets (20 mg total) by mouth daily. 01/26/17  Yes Leonie Man, MD  HUMULIN 70/30 KWIKPEN (70-30) 100 UNIT/ML PEN Per sliding scale, takes this qAM and aPM:  140-199 - 4 units, 200-250 - 6 units, 251-299 - 8 units,  300-349 - 10 units,  350 or above 12 units. Dispense syringes and needles as needed, Ok to switch to PEN if approved. Substitute to any brand approved. DX DM2, Code E11.65 08/10/17  Yes McGowen, [name], MD  HYDROcodone-acetaminophen (NORCO/VICODIN) 5-325 MG tablet Take 2 tablets by mouth every 4 (four) hours as needed. 06/10/17  Yes Noemi Chapel, MD  metoprolol tartrate (LOPRESSOR) 25 MG tablet TAKE 25 MG ( 1 TABLET)  IN THE MORNING , TAKE 50 MG ( 2 TABLETS) AT BEDTIME 03/13/17  Yes Leonie Man, MD  pantoprazole (PROTONIX) 40 MG tablet Take 1 tablet (40 mg total) by mouth daily. 08/10/17  Yes McGowen, [name], MD  potassium chloride (K-DUR) 10 MEQ tablet TAKE 1 TABLET BY MOUTH ONCE DAILY 07/24/17  Yes McGowen, [name], MD  acetaminophen (TYLENOL) 500  MG tablet Take 500-1,000 mg by mouth every 6 (six) hours as needed for headache (or pain).     [provider]  glucose blood test strip Use to check blood sugar three times daily 02/17/17   McGowen, [name], MD  nitroGLYCERIN (NITROSTAT) 0.4 MG SL tablet Place 1 tablet (0.4 mg total) under the tongue every 5 (five) minutes as needed. For chest pain 12/31/12   Swords, Darrick Penna, MD  ONE TOUCH ULTRA TEST test strip USE  STRIP TO CHECK GLUCOSE THREE TIMES DAILY 03/13/17   McGowen, [name], MD  [facility] DELICA LANCETS 56D MISC TEST 3 TIMES A DAY 05/01/17   McGowen, [name], MD     Family History  Problem Relation Age of Onset  . Heart failure Mother   . Kidney disease Mother        renal failure  . Diabetes Mother   . Diabetes Father   . Diabetes Brother   . Heart disease Brother   . Heart attack Brother   . Heart attack Brother   . Heart attack Brother   . Colon cancer Neg Hx     Social History   Socioeconomic History  . Marital status: Married    Spouse name: None  . Number of children: None  . Years of education: None  . Highest education level: None  Social Needs  . Financial resource strain: None  . Food insecurity - worry: None  . Food insecurity - inability: None  . Transportation needs - medical: None  . Transportation needs - non-medical: None  Occupational History  . Occupation: Retired Public affairs consultant: UNEMPLOYED  Tobacco Use  . Smoking status: Former Smoker    Packs/day: 1.00    Years: 45.00    Pack years: 45.00    Types: Cigarettes    Last attempt to quit: 10/24/2008    Years since quitting: 8.8  . Smokeless tobacco: Never Used  Substance and Sexual Activity  . Alcohol use: No    Alcohol/week: 0.0 oz  . Drug use: No  . Sexual activity: None  Other Topics Concern  . None  Social History Narrative   Lives in [place], has husband and daughter and son. Daughter's family is living with her and husband at present. She is ambulatory daily  without cane or walker.  Review of Systems: A 12 point ROS discussed and pertinent positives are indicated in the HPI above.  All other systems are negative.  Review of Systems  Constitutional: Negative for activity change, fatigue and unexpected weight change.  Respiratory: Negative for cough and shortness of breath.   Cardiovascular: Negative for chest pain.  Gastrointestinal: Negative for abdominal pain.  Neurological: Negative for weakness.  Psychiatric/Behavioral: Negative for behavioral problems and confusion.    Vital Signs: BP (!) 122/49 (BP Location: Right Arm)   Pulse 77   Temp 97.7 F (36.5 C) (Oral)   Ht 5' 2"  (1.575 m)   Wt 146 lb (66.2 kg)   SpO2 99%   BMI 26.70 kg/m   Physical Exam  Constitutional: She is oriented to person, place, and time.  Cardiovascular: Normal rate, regular rhythm and normal heart sounds.  Pulmonary/Chest: Effort normal and breath sounds normal.  Abdominal: Soft. Bowel sounds are normal.  Musculoskeletal: Normal range of motion.  Neurological: She is alert and oriented to person, place, and time.  Skin: Skin is warm and dry.  Psychiatric: She has a normal mood and affect. Her behavior is normal. Judgment and thought content normal.  Nursing note and vitals reviewed.   Imaging: No results found.  Labs:  CBC: Recent Labs    12/09/16 0452 12/27/16 1201 04/20/17 0848 09/12/17 0942  WBC 8.5 7.2 7.0 7.2  HGB 9.9* 11.0* 11.0* 11.7*  HCT 31.3* 32.7* 33.8* 36.6  PLT 231 205.0 285.0 184    COAGS: Recent Labs    09/15/16 0450 09/21/16 0815 12/01/16 2204 09/12/17 0942  INR 1.29 1.12 1.33 1.02  APTT 26  --  30 29    BMP: Recent Labs    12/06/16 0659 12/07/16 0625 12/08/16 0426 12/09/16 0452 12/27/16 1201 04/20/17 0848 08/10/17 1044  NA 138 139 140 136 143 145 139  K 4.0 3.4* 3.8 3.7 3.6 4.4 4.4  CL 108 102 102 99* 104 107 101  CO2 22 25 27 27  33* 29 32  GLUCOSE 123* 103* 110* 101* 148* 150* 215*  BUN 9 11 15  19 10 13 13   CALCIUM 8.5* 8.6* 8.5* 8.6* 8.8 9.2 9.3  CREATININE 0.85 0.81 0.79 0.81 0.79 0.93 0.92  GFRNONAA >60 >60 >60 >60  --   --   --   GFRAA >60 >60 >60 >60  --   --   --     LIVER FUNCTION TESTS: Recent Labs    12/01/16 1946 12/03/16 0259 12/06/16 0659 04/20/17 0848  BILITOT 0.8 0.5 0.5 0.3  AST 20 16 20 14   ALT 15 12* 15 10  ALKPHOS 93 79 63 99  PROT 5.8* 5.3* 5.8* 6.5  ALBUMIN 3.0* 2.3* 2.4* 3.8    TUMOR MARKERS: No results for input(s): AFPTM, CEA, CA199, CHROMGRNA in the last 8760 hours.  Assessment and Plan:  Sternal lesion Hx Lung Ca 2010 Scheduled for sternal lesion bx Risks and benefits discussed with the patient including, but not limited to bleeding, infection, damage to adjacent structures or low yield requiring additional tests. All of the patient's questions were answered, patient is agreeable to proceed. Consent signed and in chart.   Thank you for this interesting consult.  I greatly enjoyed meeting [name] and look forward to participating in their care.  A copy of this report was sent to the requesting provider on this date.  Electronically Signed: Lavonia Drafts, PA-C 09/12/2017, 10:53 AM   I spent a total of  30 Minutes  in face to face in clinical consultation, greater than 50% of which was counseling/coordinating care for sternal lesion bx

## 2017-09-13 ENCOUNTER — Encounter: Payer: Self-pay | Admitting: Cardiology

## 2017-09-13 ENCOUNTER — Ambulatory Visit: Payer: Medicare Other | Admitting: Cardiology

## 2017-09-13 VITALS — BP 127/62 | HR 85 | Ht 62.0 in | Wt 149.2 lb

## 2017-09-13 DIAGNOSIS — I5032 Chronic diastolic (congestive) heart failure: Secondary | ICD-10-CM | POA: Diagnosis not present

## 2017-09-13 DIAGNOSIS — I5181 Takotsubo syndrome: Secondary | ICD-10-CM

## 2017-09-13 DIAGNOSIS — I2510 Atherosclerotic heart disease of native coronary artery without angina pectoris: Secondary | ICD-10-CM

## 2017-09-13 DIAGNOSIS — I10 Essential (primary) hypertension: Secondary | ICD-10-CM | POA: Diagnosis not present

## 2017-09-13 DIAGNOSIS — E782 Mixed hyperlipidemia: Secondary | ICD-10-CM | POA: Diagnosis not present

## 2017-09-13 NOTE — Patient Instructions (Addendum)
Medication Instructions: STOP the Plavix Take the Furosemide 20 mg daily (half a tablet)  If you need a refill on your cardiac medications before your next appointment, please call your pharmacy.    Follow-Up: Your physician wants you to follow-up in: 12 months with Dr. Ellyn Hack. You will receive a reminder letter in the mail two months in advance. If you don't receive a letter, please call our office at [phone] to schedule this follow-up appointment.  Special Instructions  Keep walking!!!!!!    Thank you for choosing [facility]care at [facility]!!

## 2017-09-13 NOTE — Progress Notes (Signed)
PCP: [name], MD  Clinic Note: Chief Complaint  Patient presents with  . Follow-up    Takotsubo cardiomyopathy   HPI: [name] is a 71 y.o. female with a PMH below who presents today 66-monthfollow-up with a history of Takotsubo cardiomyopathy in the setting of septic shock from pelvic abscess in Nov 2017 - EF now essentially back to "normal".  She has h/o Small Cell Lung Ca.   SJONIECE SMOTHERMANwas last seen in May 2018 to follow-up after her echocardiogram showing improved EF of the 50- 55%.  She was more concerned about her peritoneal drain than anything else.  She had chest wall pain from CPR but otherwise was doing fine.  Recent Hospitalizations: - None. She never underwent the procedure to remove the peritoneal drain.  Studies Personally Reviewed - (if available, images/films reviewed: From Epic Chart or Care Everywhere)  No new studies.  Interval History: SJudythpresents today overall quite stable.  She has had her peritoneal drain removed.  She is much happy about that.  She is now gradually building back her strength.  She walks with a walker, but still has exertional dyspnea related to her lung disease.  She denies any significant PND or orthopnea.  No edema.  She still has chronic persistent cough and mild but mildly productive.  She has some wheezing as well. The chest discomfort from her CPR seems to have resolved now with just intermittent sharp pains. She has no anginal chest pain or discomfort with rest or exertion. No rapid irregular heartbeats or palpitations.  No syncope/near syncope or TIA/emesis fugax.  No claudication.   ROS: A comprehensive was performed. Review of Systems  Constitutional: Negative for malaise/fatigue (Overall activity level seems improving) and weight loss.  HENT: Positive for congestion. Negative for nosebleeds.   Respiratory: Positive for cough, sputum production (Just phlegm. Nothing suggestive of infectious etiology),  shortness of breath (At baseline) and wheezing.   Cardiovascular:       Per history of present illness  Gastrointestinal: Negative for blood in stool and melena.  Genitourinary: Negative for hematuria.  Musculoskeletal: Positive for joint pain. Negative for falls and myalgias.  Skin: Negative.   Neurological:       Mildly unsteady gait. Walks with a cane  Endo/Heme/Allergies: Positive for environmental allergies.  Psychiatric/Behavioral: Positive for memory loss. The patient is not nervous/anxious.   All other systems reviewed and are negative.   I have reviewed and (if needed) personally updated the patient's problem list, medications, allergies, past medical and surgical history, social and family history.   Past Medical History:  Diagnosis Date  . Age-related nuclear cataract of both eyes 2016   +cortical age related cataracts OU   . Allergy    seasonal  . Arthritis    hands  . Bilateral diabetic retinopathy (HCC) 2015   Dr. MZigmund Daniel . Blood transfusion without reported diagnosis    when taking chemo  . Chronic diastolic heart failure (HCC)    Takotsubo CM (resolved @5 /2018):  a. 08/2016 Echo: EF 30-35%, gr1DD, PASP 363mg;  b. 11/2016 Echo: EF 40-45%, no rwma, nl RV fxn, mild TR, PASP 3737m.  . CKD (chronic kidney disease), stage II    GFR 60s  . 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  . CMarland KitchenPD (chronic obstructive pulmonary disease) ([place] . CV[place]cere[place]) [place]   MRI did show a left-sided small ischemic stroke which was  acute but likely incidental (MRI was done b/c pt had TIA sx's in hospital).  Neuro put her on plavix at that time.  . Diabetes mellitus with complication (HCC)    diab retinopathy OU (laser)  . GERD (gastroesophageal reflux disease)    protonix  . History of diverticulitis of colon    with abscess; required IR percutaneous drain placed 09/2016.  Marland Kitchen Hyperlipidemia   . Hypertension   .  Left bundle branch block (LBBB) 12/16/2016  . Lung cancer ([place])    non-small cell lung ca, stage III in 05/2011; systemic chemotherapy concurrent with radiation followed by prophylactic cranial irradiation and has been observation since July of 2010 with no evidence for disease recurrence-released from onc f/u 12/2014 (needs annual cxr by PCP).  CXR 08/2015 stable.  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 Earlie Server to bx this.  . Mild cognitive impairment with memory loss    Likely from brain radiation therapy  . Non-obstructive CAD (coronary artery disease)    a. Cath 2006 preserved LV fxn, scattered irregularities without critical stenosis; b. 2008 stress echo negative for ischemia, but with hypertensive response; c. 08/2016 Cath: D1 25%, otw nl.  . Normocytic anemia 2018   Iron, vit B12, folate all normal 12/2016.  Marland Kitchen Open-angle glaucoma 2016   Dr. Shirley Muscat, (bilateral)---responding to topical therapy  . Osteopenia 03/2016   03/2016 DEXA T-score -2.2  . Pneumonia    hospitalization 11/2016; hypoxemic resp failure--d/c'd home with home oxygen therapy  . Subclinical hyperthyroidism 2018  . Takotsubo cardiomyopathy    a. 08/2016 Echo: EF 30-35%, gr1DD, PASP 73mHg;  b. 08/2016 Cath: nonobs Dzs;  c. 11/2016 Echo: EF 40-45%, no rwma, nl RV fxn, mild TR, PASP 360mg.  ECHO 02/2017 EF 50-55%, grd II DD---essentially resolved Takotsubo 02/2017.  . [place] de pointes ([place]   a. 08/2016 in setting of diverticulitis & pneumoperitoneum and Takotsubo CM -->prolonged QT, seen by EP-->avoid meds with potential for QT prolongation.     HOSPITALIZED November 2017: diverticulitis and pneumoperitoneum with sepsis. In the setting of significant pain and sepsis she had an episode of Torsade de Pointes VT and underwent CPR with defibrillation 2. --> Initial echo revealed EF 30-35% with anterior hypokinesis --> urgent catheterization revealed nonocclusive CAD with wall motion suggestive of Takotsubo Cardiomyopathy -->  not discharged on LifeVest. Was placed on optimal medical management once her blood pressure stabilized.  Past Surgical History:  Procedure Laterality Date  . ABDOMINAL HYSTERECTOMY  1997  . APPENDECTOMY    . CARDIAC CATHETERIZATION N/A 09/14/2016   Procedure: Left Heart Cath and Coronary Angiography;  Surgeon: ThTroy SineMD;  [facility];  Service: Cardiovascular; Mild nonobstructive CAD with 85% smooth narrowing in the first diagonal branch of the LAD; 10% smooth narrowing of the ostial proximal left circumflex coronary artery; and a normal dominant RCA.  . Marland KitchenARPAL TUNNEL RELEASE    . CATARACT EXTRACTION    . CHOLECYSTECTOMY  2002  . COLONOSCOPY  10/24/00   normal.  [facility] hemoccult testing via [place]/25/16 was NEG  . IR GENERIC HISTORICAL  09/19/2016   IR SINUS/FIST TUBE CHK-NON GI 09/19/2016 JaCorrie MckusickDO [facility]  . IR GENERIC HISTORICAL  12/05/2016   Fistula smaller/improving.  IR SINUS/FIST TUBE CHK-NON GI 12/05/2016 JoSandi MariscalMD [facility]  . IR GENERIC HISTORICAL  12/22/2016   IR RADIOLOGIST EVAL & MGMT 12/22/2016 MiGreggory KeenMD [facility]  . IR RADIOLOGIST EVAL & MGMT  02/09/2017  .  IR SINUS/FIST TUBE CHK-NON GI  03/28/2017  . LEFT HEART CATHETERIZATION WITH CORONARY ANGIOGRAM N/A 05/30/2012   Procedure: LEFT HEART CATHETERIZATION WITH CORONARY ANGIOGRAM;  Surgeon: Burnell Blanks, MD;  Location: [facility]ATH LAB;  Service: Cardiovascular: Mild, non-obstructive CAD  . TRANSTHORACIC ECHOCARDIOGRAM  09/14/2016; 11/2016; 02/2017   08/2016: EF 30-35 %, Akinesis of the mid-apicalanteroseptal myocardium.  Grade I DD.  Mild pulm HTN.  Repeat echo 11/2016, EF 40-45%, no rwma, nl RV fxn, mild TR, PASP 62mHg.  02/2017 EF 50-55%, grade II DD    Current Meds  Medication Sig  . acetaminophen (TYLENOL) 500 MG tablet Take 500-1,000 mg by mouth every 6 (six) hours as needed for headache (or pain).   .Marland Kitchenalbuterol (PROAIR HFA) 108 (90 Base) MCG/ACT inhaler  Inhale 2 puffs into the lungs every 6 (six) hours as needed for wheezing or shortness of breath.  .Marland Kitchenalbuterol (PROVENTIL) (2.5 MG/3ML) 0.083% nebulizer solution USE ONE VIAL IN NEBULIZER EVERY 4 HOURS AS NEEDED FOR WHEEZING OR SHORTNESS OF BREATH  . aspirin 81 MG chewable tablet Chew 81 mg by mouth daily.  .Marland Kitchenatorvastatin (LIPITOR) 80 MG tablet Take 1 tablet (80 mg total) by mouth daily.  . cetirizine (ZYRTEC) 10 MG tablet Take 10 mg by mouth daily.  .Marland Kitchenescitalopram (LEXAPRO) 5 MG tablet Take 1 tablet (5 mg total) by mouth daily.  . fluticasone (FLOVENT HFA) 220 MCG/ACT inhaler Inhale 2 puffs into the lungs 2 (two) times daily.  . furosemide (LASIX) 40 MG tablet Take 0.5 tablets (20 mg total) by mouth daily.  .Marland Kitchenglucose blood test strip Use to check blood sugar three times daily  . HUMULIN 70/30 KWIKPEN (70-30) 100 UNIT/ML PEN Per sliding scale, takes this qAM and aPM:  140-199 - 4 units, 200-250 - 6 units, 251-299 - 8 units,  300-349 - 10 units,  350 or above 12 units. Dispense syringes and needles as needed, Ok to switch to PEN if approved. Substitute to any brand approved. DX DM2, Code E11.65  . HYDROcodone-acetaminophen (NORCO/VICODIN) 5-325 MG tablet Take 2 tablets by mouth every 4 (four) hours as needed.  . metoprolol tartrate (LOPRESSOR) 25 MG tablet TAKE 25 MG ( 1 TABLET)  IN THE MORNING , TAKE 50 MG ( 2 TABLETS) AT BEDTIME  . nitroGLYCERIN (NITROSTAT) 0.4 MG SL tablet Place 1 tablet (0.4 mg total) under the tongue every 5 (five) minutes as needed. For chest pain  . ONE TOUCH ULTRA TEST test strip USE  STRIP TO CHECK GLUCOSE THREE TIMES DAILY  . ONETOUCH DELICA LANCETS 397LMISC TEST 3 TIMES A DAY  . pantoprazole (PROTONIX) 40 MG tablet Take 1 tablet (40 mg total) by mouth daily.  . potassium chloride (K-DUR) 10 MEQ tablet TAKE 1 TABLET BY MOUTH ONCE DAILY  . [DISCONTINUED] clopidogrel (PLAVIX) 75 MG tablet TAKE ONE TABLET BY MOUTH ONCE DAILY    Allergies  Allergen Reactions  . Lactose  Intolerance (Gi) Diarrhea  . Ibuprofen Other (See Comments)    REACTION: Anxiousness, hyperventilates    Social History   Socioeconomic History  . Marital status: Married    Spouse name: None  . Number of children: None  . Years of education: None  . Highest education level: None  Social Needs  . Financial resource strain: None  . Food insecurity - worry: None  . Food insecurity - inability: None  . Transportation needs - medical: None  . Transportation needs - non-medical: None  Occupational History  . Occupation: Retired  Public affairs consultant: UNEMPLOYED  Tobacco Use  . Smoking status: Former Smoker    Packs/day: 1.00    Years: 45.00    Pack years: 45.00    Types: Cigarettes    Last attempt to quit: 10/24/2008    Years since quitting: 8.8  . Smokeless tobacco: Never Used  Substance and Sexual Activity  . Alcohol use: No    Alcohol/week: 0.0 oz  . Drug use: No  . Sexual activity: None  Other Topics Concern  . None  Social History Narrative   Lives in [place], has husband and daughter and son. Daughter's family is living with her and husband at present. She is ambulatory daily without cane or walker.     family history includes Diabetes in her brother, father, and mother; Heart attack in her brother, brother, and brother; Heart disease in her brother; Heart failure in her mother; Kidney disease in her mother.  Wt Readings from Last 3 Encounters:  09/13/17 149 lb 3.2 oz (67.7 kg)  09/12/17 146 lb (66.2 kg)  09/05/17 147 lb 4.8 oz (66.8 kg)    PHYSICAL EXAM BP 127/62   Pulse 85   Ht 5' 2"  (1.575 m)   Wt 149 lb 3.2 oz (67.7 kg)   BMI 27.29 kg/m   Physical Exam  Constitutional: She is oriented to person, place, and time. She appears well-developed and well-nourished. No distress.  Somewhat chronically ill-appearing.  Well-groomed.  HENT:  Head: Normocephalic and atraumatic.  Eyes: EOM are normal.  Neck: Normal range of motion. Neck supple. No  hepatojugular reflux and no JVD present. Carotid bruit is not present.  Cardiovascular: Normal rate, regular rhythm, normal heart sounds and intact distal pulses.  No extrasystoles are present. PMI is not displaced. Exam reveals no gallop and no friction rub.  No murmur heard. Pulmonary/Chest: Effort normal. No respiratory distress. She has no rales. She exhibits tenderness (Much less notable.).  Diffuse coarse rhonchi and wheezing.  No rales.  Minimal clearing with cough.  Abdominal: Soft. Bowel sounds are normal. She exhibits no distension. There is no tenderness. There is no rebound.  Musculoskeletal: Normal range of motion. She exhibits no edema.  Walks with a walker/cane with mildly unsteady gait.  Neurological: She is alert and oriented to person, place, and time. No cranial nerve deficit.  Skin: Skin is warm and dry.  Psychiatric: She has a normal mood and affect. Her behavior is normal. Judgment and thought content normal.  Nursing note and vitals reviewed.    Adult ECG Report N/a   Other studies Reviewed: Additional studies/ records that were reviewed today include:  Recent Labs:   Lab Results  Component Value Date   CREATININE 0.92 08/10/2017   BUN 13 08/10/2017   NA 139 08/10/2017   K 4.4 08/10/2017   CL 101 08/10/2017   CO2 32 08/10/2017   Lab Results  Component Value Date   CHOL 65 12/02/2016   HDL 34 (L) 12/02/2016   LDLCALC 23 12/02/2016   LDLDIRECT 162.0 07/17/2015   TRIG 42 12/02/2016   CHOLHDL 1.9 12/02/2016   ASSESSMENT / PLAN: Problem List Items Addressed This Visit    Chronic diastolic (congestive) heart failure (HCC) (Chronic)    Systolic component of her heart failure seems to have resolved.  Now only diastolic dysfunction noted.  She is on stable dose of statin and beta-blocker.  No additional medication required as she is euvolemic now.      Essential hypertension (Chronic)  Blood pressure looks pretty good on current dose of beta-blocker.        Hyperlipidemia (Chronic)    Remains on high-dose statin.  I think we can probably reduce the dose to 40 or 20 mg daily..      Non-occlusive coronary artery disease    No anginal symptoms.  No significant CAD.  She is on aspirin and statin as well as Plavix.  I think we can stop the Plavix at this point.      Takotsubo cardiomyopathy - Primary (Chronic)    Essentially resolved cardiomyopathy.  We will continue to optimize medical management.  EF is back to normal. I think we can have her continue on her current dose of Lasix with additional doses as needed, but seems to be relatively stable. She is on stable dose of metoprolol, but with otherwise normal blood pressures she is not on ACE inhibitor/ARB.         Current medicines are reviewed at length with the patient today. (+/- concerns) n/a  The following changes have been made: n/a   Patient Instructions  Medication Instructions: STOP the Plavix Take the Furosemide 20 mg daily (half a tablet)  If you need a refill on your cardiac medications before your next appointment, please call your pharmacy.    Follow-Up: Your physician wants you to follow-up in: 12 months with Dr. Ellyn Hack. You will receive a reminder letter in the mail two months in advance. If you don't receive a letter, please call our office at [phone] to schedule this follow-up appointment.  Special Instructions  Keep walking!!!!!!    Thank you for choosing [facility]!!        Studies Ordered:   No orders of the defined types were placed in this encounter.    Glenetta Hew, M.D., M.S. Interventional Cardiologist   Pager # [phone] Phone # [phone] [address] [place], [place] [address]

## 2017-09-15 ENCOUNTER — Encounter: Payer: Self-pay | Admitting: Cardiology

## 2017-09-15 NOTE — Assessment & Plan Note (Signed)
Essentially resolved cardiomyopathy.  We will continue to optimize medical management.  EF is back to normal. I think we can have her continue on her current dose of Lasix with additional doses as needed, but seems to be relatively stable. She is on stable dose of metoprolol, but with otherwise normal blood pressures she is not on ACE inhibitor/ARB.

## 2017-09-15 NOTE — Assessment & Plan Note (Signed)
No anginal symptoms.  No significant CAD.  She is on aspirin and statin as well as Plavix.  I think we can stop the Plavix at this point.

## 2017-09-15 NOTE — Assessment & Plan Note (Signed)
Blood pressure looks pretty good on current dose of beta-blocker.

## 2017-09-15 NOTE — Assessment & Plan Note (Signed)
Systolic component of her heart failure seems to have resolved.  Now only diastolic dysfunction noted.  She is on stable dose of statin and beta-blocker.  No additional medication required as she is euvolemic now.

## 2017-09-15 NOTE — Assessment & Plan Note (Signed)
Remains on high-dose statin.  I think we can probably reduce the dose to 40 or 20 mg daily.Marland Kitchen

## 2017-09-17 ENCOUNTER — Encounter: Payer: Self-pay | Admitting: Family Medicine

## 2017-09-19 ENCOUNTER — Ambulatory Visit (HOSPITAL_BASED_OUTPATIENT_CLINIC_OR_DEPARTMENT_OTHER): Payer: Medicare Other | Admitting: Internal Medicine

## 2017-09-19 ENCOUNTER — Encounter: Payer: Self-pay | Admitting: Internal Medicine

## 2017-09-19 VITALS — BP 95/65 | HR 85 | Temp 97.6°F | Resp 18 | Ht 62.0 in | Wt 150.5 lb

## 2017-09-19 DIAGNOSIS — C3490 Malignant neoplasm of unspecified part of unspecified bronchus or lung: Secondary | ICD-10-CM

## 2017-09-19 DIAGNOSIS — M899 Disorder of bone, unspecified: Secondary | ICD-10-CM

## 2017-09-19 DIAGNOSIS — Z85118 Personal history of other malignant neoplasm of bronchus and lung: Secondary | ICD-10-CM

## 2017-09-19 DIAGNOSIS — M898X9 Other specified disorders of bone, unspecified site: Secondary | ICD-10-CM

## 2017-09-19 NOTE — Progress Notes (Signed)
[facility] [phone]   [phone]  OFFICE PROGRESS NOTE  [name], MD [address] [place] [address]  PRINCIPAL DIAGNOSIS: Limited stage small cell lung cancer diagnosed in March of 2010.   PRIOR THERAPY:  1. Status post 4 cycles of systemic chemotherapy with carboplatin and etoposide concurrent with radiation during cycle 2 and 3. The last dose of chemotherapy was given April 01, 2009. 2. Status post prophylactic cranial irradiation under the care of Dr. Lisbeth Renshaw completed May 19, 2009.  CURRENT THERAPY: Observation.  INTERVAL HISTORY: [name] 71 y.o. female returns to the clinic today for follow-up visit accompanied by her husband.  The patient is feeling fine today with no specific complaints.  She denied having any chest pain, shortness of breath, cough or hemoptysis.  She denied having any recent weight loss or night sweats.  She has no nausea, vomiting, diarrhea or constipation.  She recently underwent CT-guided core biopsy of the suspicious lytic lesion in the sternum by interventional radiology.  She is here today for evaluation and discussion of her biopsy results and treatment options.  MEDICAL HISTORY: Past Medical History:  Diagnosis Date  . Age-related nuclear cataract of both eyes 2016   +cortical age related cataracts OU   . Allergy    seasonal  . Arthritis    hands  . Bilateral diabetic retinopathy (HCC) 2015   Dr. Zigmund Daniel  . Blood transfusion without reported diagnosis    when taking chemo  . Chronic diastolic heart failure (HCC)    Takotsubo CM (resolved _0 /2018):  a. 08/2016 Echo: EF 30-35%, gr1DD, PASP 50mHg;  b. 11/2016 Echo: EF 40-45%, no rwma, nl RV fxn, mild TR, PASP 365mg.  . CKD (chronic kidney disease), stage II    GFR 60s  . 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  . Marland KitchenOPD (chronic obstructive pulmonary disease) ([facility]  . CVA (cerebral  vascular accident) ([facility]12/2017   MRI did show a left-sided small ischemic stroke which was acute but likely incidental (MRI was done b/c pt had TIA sx's in hospital).  Neuro put her on plavix at that time.  Cardiology d/c'd her plavix 08/2017.  . Diabetes mellitus with complication (HCC)    diab retinopathy OU (laser)  . GERD (gastroesophageal reflux disease)    protonix  . History of diverticulitis of colon    with abscess; required IR percutaneous drain placed 09/2016.  . Marland Kitchenyperlipidemia   . Hypertension   . Left bundle branch block (LBBB) 12/16/2016  . Lung cancer ([facility]   non-small cell lung ca, stage III in 05/2011; systemic chemotherapy concurrent with radiation followed by prophylactic cranial irradiation and has been observation since July of 2010 with no evidence for disease recurrence-released from onc f/u 12/2014 (needs annual cxr by PCP).  CXR 08/2015 stable.  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 MoEarlie Servero bx this.  . Mild cognitive impairment with memory loss    Likely from brain radiation therapy  . Non-obstructive CAD (coronary artery disease)    a. Cath 2006 preserved LV fxn, scattered irregularities without critical stenosis; b. 2008 stress echo negative for ischemia, but with hypertensive response; c. 08/2016 Cath: D1 25%, otw nl.  . Normocytic anemia 2018   Iron, vit B12, folate all normal 12/2016.  . Marland Kitchenpen-angle glaucoma 2016   Dr. BeShirley Muscat(bilateral)---responding to topical therapy  . Osteopenia 03/2016   03/2016 DEXA T-score -  2.2  . Pneumonia    hospitalization 11/2016; hypoxemic resp failure--d/c'd home with home oxygen therapy  . Subclinical hyperthyroidism 2018  . Takotsubo cardiomyopathy    a. 08/2016 Echo: EF 30-35%, gr1DD, PASP 69mHg;  b. 08/2016 Cath: nonobs Dzs;  c. 11/2016 Echo: EF 40-45%, no rwma, nl RV fxn, mild TR, PASP 353mg.  ECHO 02/2017 EF 50-55%, grd II DD---essentially resolved Takotsubo 02/2017.  . [place] ([facility]   a. 08/2016 in  setting of diverticulitis & pneumoperitoneum and Takotsubo CM -->prolonged QT, seen by EP-->avoid meds with potential for QT prolongation.    ALLERGIES:  is allergic to lactose intolerance (gi) and ibuprofen.  MEDICATIONS:  Current Outpatient Medications  Medication Sig Dispense Refill  . acetaminophen (TYLENOL) 500 MG tablet Take 500-1,000 mg by mouth every 6 (six) hours as needed for headache (or pain).     . Marland Kitchenlbuterol (PROAIR HFA) 108 (90 Base) MCG/ACT inhaler Inhale 2 puffs into the lungs every 6 (six) hours as needed for wheezing or shortness of breath. 18 g 3  . albuterol (PROVENTIL) (2.5 MG/3ML) 0.083% nebulizer solution USE ONE VIAL IN NEBULIZER EVERY 4 HOURS AS NEEDED FOR WHEEZING OR SHORTNESS OF BREATH 75 mL 3  . aspirin 81 MG chewable tablet Chew 81 mg by mouth daily.    . Marland Kitchentorvastatin (LIPITOR) 80 MG tablet Take 1 tablet (80 mg total) by mouth daily. 90 tablet 1  . cetirizine (ZYRTEC) 10 MG tablet Take 10 mg by mouth daily.    . Marland Kitchenscitalopram (LEXAPRO) 5 MG tablet Take 1 tablet (5 mg total) by mouth daily. 90 tablet 3  . fluticasone (FLOVENT HFA) 220 MCG/ACT inhaler Inhale 2 puffs into the lungs 2 (two) times daily. 12 g 6  . furosemide (LASIX) 40 MG tablet Take 0.5 tablets (20 mg total) by mouth daily. 30 tablet 6  . glucose blood test strip Use to check blood sugar three times daily 100 each 12  . HUMULIN 70/30 KWIKPEN (70-30) 100 UNIT/ML PEN Per sliding scale, takes this qAM and aPM:  140-199 - 4 units, 200-250 - 6 units, 251-299 - 8 units,  300-349 - 10 units,  350 or above 12 units. Dispense syringes and needles as needed, Ok to switch to PEN if approved. Substitute to any brand approved. DX DM2, Code E11.65 15 mL 6  . HYDROcodone-acetaminophen (NORCO/VICODIN) 5-325 MG tablet Take 2 tablets by mouth every 4 (four) hours as needed. 10 tablet 0  . metoprolol tartrate (LOPRESSOR) 25 MG tablet TAKE 25 MG ( 1 TABLET)  IN THE MORNING , TAKE 50 MG ( 2 TABLETS) AT BEDTIME 270 tablet 3  .  nitroGLYCERIN (NITROSTAT) 0.4 MG SL tablet Place 1 tablet (0.4 mg total) under the tongue every 5 (five) minutes as needed. For chest pain 50 tablet 3  . ONE TOUCH ULTRA TEST test strip USE  STRIP TO CHECK GLUCOSE THREE TIMES DAILY 100 each 11  . ONETOUCH DELICA LANCETS 3393TISC TEST 3 TIMES A DAY 100 each 11  . pantoprazole (PROTONIX) 40 MG tablet Take 1 tablet (40 mg total) by mouth daily. 90 tablet 3  . potassium chloride (K-DUR) 10 MEQ tablet TAKE 1 TABLET BY MOUTH ONCE DAILY 90 tablet 1   No current facility-administered medications for this visit.     SURGICAL HISTORY:  Past Surgical History:  Procedure Laterality Date  . ABDOMINAL HYSTERECTOMY  1997  . APPENDECTOMY    . CARDIAC CATHETERIZATION N/A 09/14/2016   Procedure: Left Heart Cath and  Coronary Angiography;  Surgeon: Thomas A Kelly, MD;  Location: [facility];  Service: Cardiovascular; Mild nonobstructive CAD with 85% smooth narrowing in the first diagonal branch of the LAD; 10% smooth narrowing of the ostial proximal left circumflex coronary artery; and a normal dominant RCA.  . CARPAL TUNNEL RELEASE    . CATARACT EXTRACTION    . CHOLECYSTECTOMY  2002  . COLONOSCOPY  10/24/00   normal.  [facility] hemoccult testing via [facility] 06/18/15 was NEG  . IR GENERIC HISTORICAL  09/19/2016   IR SINUS/FIST TUBE CHK-NON GI 09/19/2016 Jaime Wagner, DO [facility]  . IR GENERIC HISTORICAL  10/06/2016   IR RADIOLOGIST EVAL & MGMT 10/06/2016 [facility]  . IR GENERIC HISTORICAL  10/26/2016   IR RADIOLOGIST EVAL & MGMT 10/26/2016 [facility]  . IR GENERIC HISTORICAL  11/03/2016   IR RADIOLOGIST EVAL & MGMT 11/03/2016 Wendy Sanders Blair, PA-C [facility]  . IR GENERIC HISTORICAL  11/24/2016   IR RADIOLOGIST EVAL & MGMT 11/24/2016 Wendy Sanders Blair, PA-C [facility]  . IR GENERIC HISTORICAL  12/05/2016   Fistula smaller/improving.  IR SINUS/FIST TUBE CHK-NON GI 12/05/2016 John Watts, MD [facility]  . IR GENERIC  HISTORICAL  12/22/2016   IR RADIOLOGIST EVAL & MGMT 12/22/2016 Michael Shick, MD [facility]  . IR RADIOLOGIST EVAL & MGMT  01/10/2017  . IR RADIOLOGIST EVAL & MGMT  02/09/2017  . IR SINUS/FIST TUBE CHK-NON GI  03/28/2017  . LEFT HEART CATHETERIZATION WITH CORONARY ANGIOGRAM N/A 05/30/2012   Procedure: LEFT HEART CATHETERIZATION WITH CORONARY ANGIOGRAM;  Surgeon: Christopher D McAlhany, MD;  Location: [facility] CATH [facility];  Service: Cardiovascular: Mild, non-obstructive CAD  . TRANSTHORACIC ECHOCARDIOGRAM  09/14/2016; 11/2016   08/2016: EF 30-35 %, Akinesis of the mid-apicalanteroseptal myocardium.  Grade I DD.  Mild pulm HTN.  Repeat echo 11/2016, EF 40-45%, no rwma, nl RV fxn, mild TR, PASP 37mmHg.    . TRANSTHORACIC ECHOCARDIOGRAM  03/07/2017   EF 50-55%. Hypokinesis of the distal septum with overall low normal LV,  systolic function; mild diastolic dysfunction with elevated LV  filling pressure; mildly calcified aortic valve with mild AI; small pericardial effusion    REVIEW OF SYSTEMS:  A comprehensive review of systems was negative except for: Constitutional: positive for fatigue   PHYSICAL EXAMINATION: General appearance: alert, cooperative, fatigued and no distress Head: Normocephalic, without obvious abnormality, atraumatic Neck: no adenopathy Lymph nodes: Cervical, supraclavicular, and axillary nodes normal. Resp: clear to auscultation bilaterally Back: symmetric, no curvature. ROM normal. No CVA tenderness. Cardio: regular rate and rhythm, S1, S2 normal, no murmur, click, rub or gallop GI: soft, non-tender; bowel sounds normal; no masses,  no organomegaly Extremities: extremities normal, atraumatic, no cyanosis or edema  ECOG PERFORMANCE STATUS: 1 - Symptomatic but completely ambulatory  Blood pressure 95/65, pulse 85, temperature 97.6 F (36.4 C), temperature source Oral, resp. rate 18, height 5' 2" (1.575 m), weight 150 lb 8 oz (68.3 kg), SpO2 95 %.  LABORATORY DATA: Lab Results    Component Value Date   WBC 7.2 09/12/2017   HGB 11.7 (L) 09/12/2017   HCT 36.6 09/12/2017   MCV 86.9 09/12/2017   PLT 184 09/12/2017      Chemistry      Component Value Date/Time   NA 139 08/10/2017 1044   NA 141 01/15/2015 0848   K 4.4 08/10/2017 1044   K 4.0 01/15/2015 0848   CL 101 08/10/2017 1044   CL 105   02/18/2013 0812   CO2 32 08/10/2017 1044   CO2 25 01/15/2015 0848   BUN 13 08/10/2017 1044   BUN 9.0 01/15/2015 0848   CREATININE 0.92 08/10/2017 1044   CREATININE 0.7 01/15/2015 0848      Component Value Date/Time   CALCIUM 9.3 08/10/2017 1044   CALCIUM 9.2 01/15/2015 0848   ALKPHOS 99 04/20/2017 0848   ALKPHOS 92 01/15/2015 0848   AST 14 04/20/2017 0848   AST 19 01/15/2015 0848   ALT 10 04/20/2017 0848   ALT 20 01/15/2015 0848   BILITOT 0.3 04/20/2017 0848   BILITOT 0.34 01/15/2015 0848       RADIOGRAPHIC STUDIES: Ct Biopsy  Result Date: 09/12/2017 CLINICAL DATA:  History of limited stage small cell lung carcinoma treated in 2010. Status post cardiac arrest 1 year ago with CPR at that time. Recent detection of a knee lytic lesion in the sternum EXAM: CT GUIDED NEEDLE ASPIRATE AND CORE BIOPSY OF STERNAL BONE LESION ANESTHESIA/SEDATION: 2.0 mg IV Versed; 75 mcg IV Fentanyl Total Moderate Sedation Time:  15 minutes. The patient's level of consciousness and physiologic status were continuously monitored during the procedure by Radiology nursing. PROCEDURE: The procedure risks, benefits, and alternatives were explained to the patient. Questions regarding the procedure were encouraged and answered. The patient understands and consents to the procedure. The chest wall was prepped with chlorhexidine in a sterile fashion, and a sterile drape was applied covering the operative field. A sterile gown and sterile gloves were used for the procedure. Local anesthesia was provided with 1% Lidocaine. Under CT guidance, a 10 gauge bone cutting needle was advanced into the sternum at  the level of a lytic process. A coaxial 14 gauge needle was advanced through the 10 gauge needle and aspiration performed. Aspirate was submitted for cytology. Additional core biopsy was then performed through the outer 10 gauge needle and submitted in formalin. CT images were performed after biopsy. COMPLICATIONS: None FINDINGS: After localizing relatively lytic region of the mid to upper sternum, aspiration yielded thin, clear fluid with yield of approximately 3 mL of fluid. This fluid was sent for cytologic analysis. Additional core biopsy was then performed through the outer needle within the marrow space of the sternum. It is conceivable that the lytic process represents a cyst from prior trauma. IMPRESSION: Aspiration and core biopsy performed at the level of the relatively lytic process involving the sternum. Initial aspirate yielded thin, clear fluid suggestive of the presence of a cyst within the sternum. This fluid was sent for cytologic analysis. Additional core biopsy was performed in the marrow space of the sternum at this level. Electronically Signed   By: Glenn  Yamagata M.D.   On: 09/12/2017 15:02   ASSESSMENT AND PLAN: This is a very pleasant 71 years old white female with history of limited stage small cell lung cancer status post systemic chemotherapy concurrent with radiation followed by prophylactic cranial irradiation and has been observation since July of 2010 with no evidence for disease recurrence. She had a trauma recently including comminuted fracture of the proximal right humerus the patient also had compression of the chest for CPR several months ago.  Her recent CT scan showed questionable sternal lytic and sclerotic lesion suspicious for bone metastasis.  The patient underwent CT-guided core biopsy of the suspicious lytic lesion in the sternum.  The final pathology was negative for malignancy. I discussed the biopsy results with the patient and her husband and recommended for her  to continue on observation   with a routine follow-up visit by her primary care physician at this point. I would be happy to see her in the future if needed. The patient and her husband agreed to the current plan. All questions were answered. The patient knows to call the clinic with any problems, questions or concerns. We can certainly see the patient much sooner if necessary.  Disclaimer: This note was dictated with voice recognition software. Similar sounding words can inadvertently be transcribed and may not be corrected upon review.

## 2017-09-27 IMAGING — RF DG SINUS / FISTULA TRACT / ABSCESSOGRAM
1 series · 9 of 9 positions shown · non-contrast
Comparison: none

INDICATION: Follow-up diverticular abscess.  Persistent colonic fistula.

[Series 1: one shot · 9 of 9 slices shown]
[im 1/9]
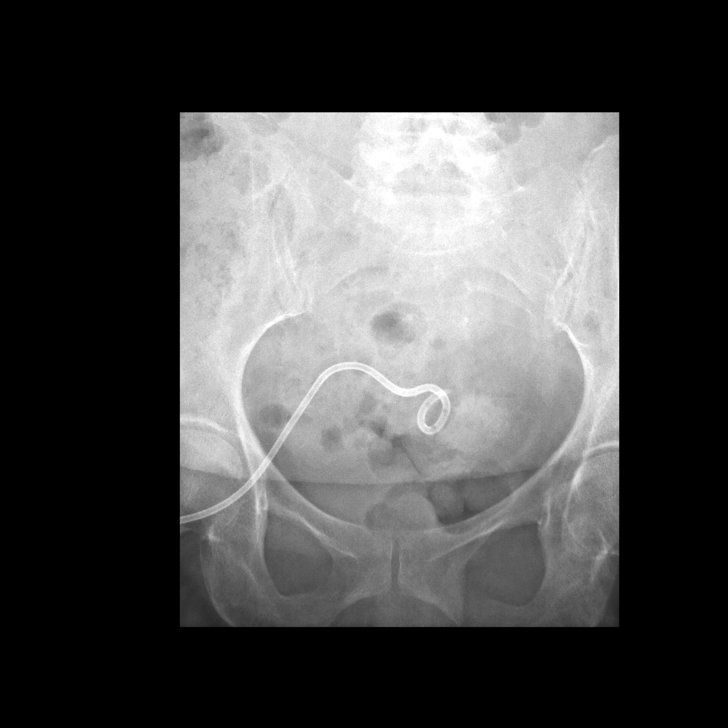
[im 2/9]
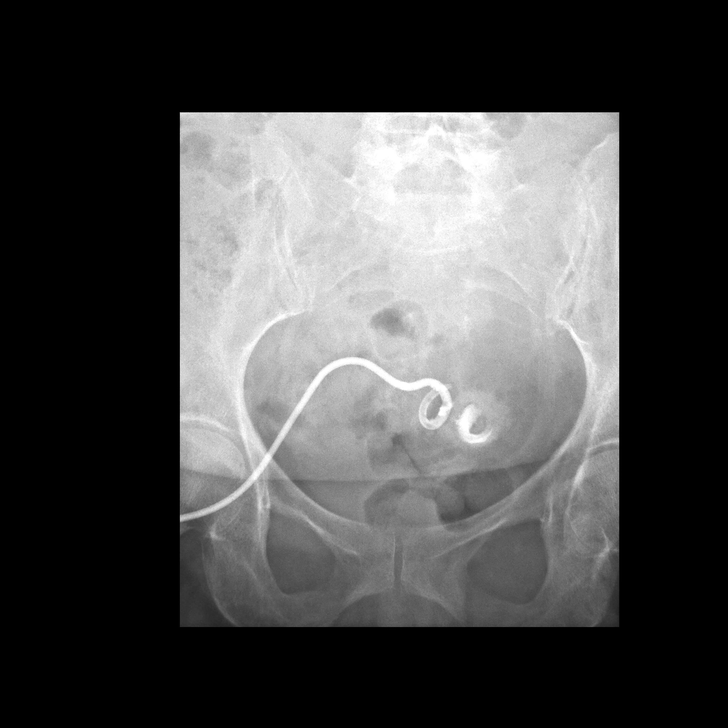
[im 3/9]
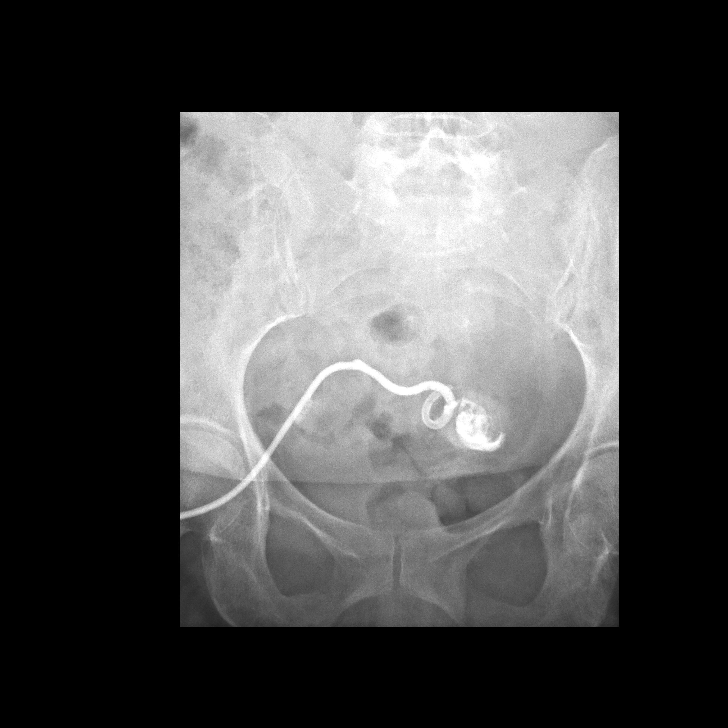
[im 4/9]
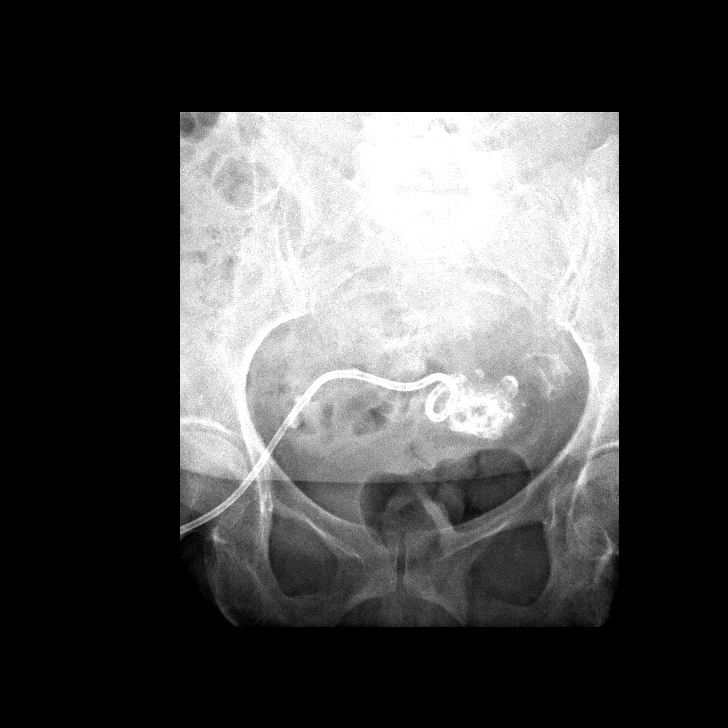
[im 5/9]
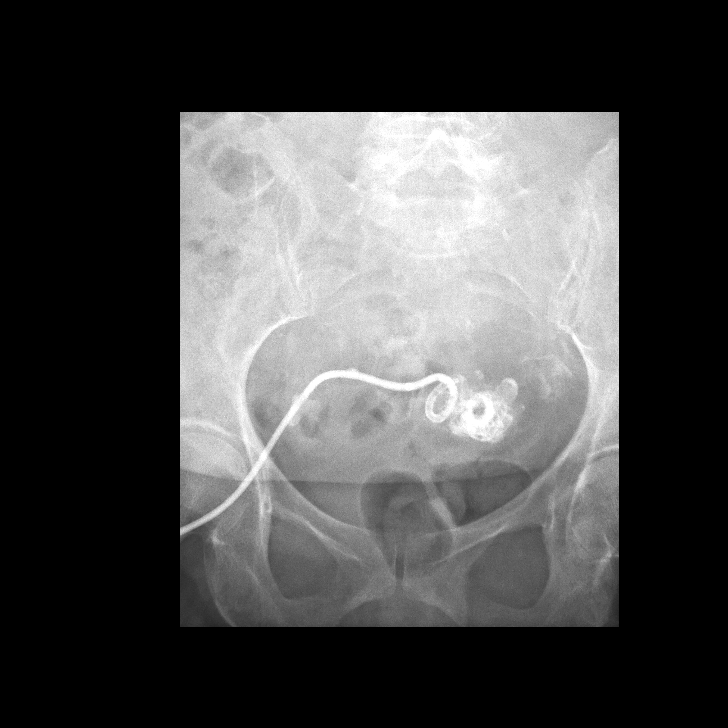
[im 6/9]
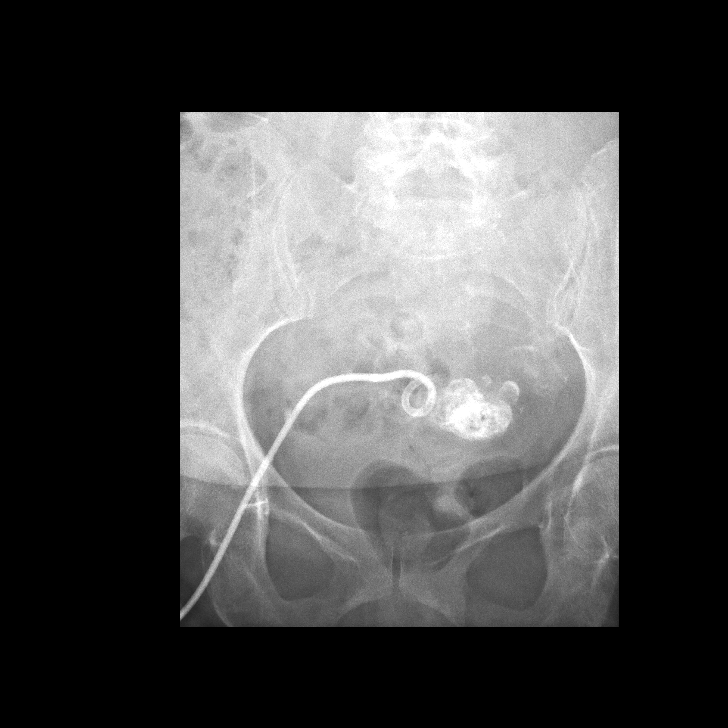
[im 7/9]
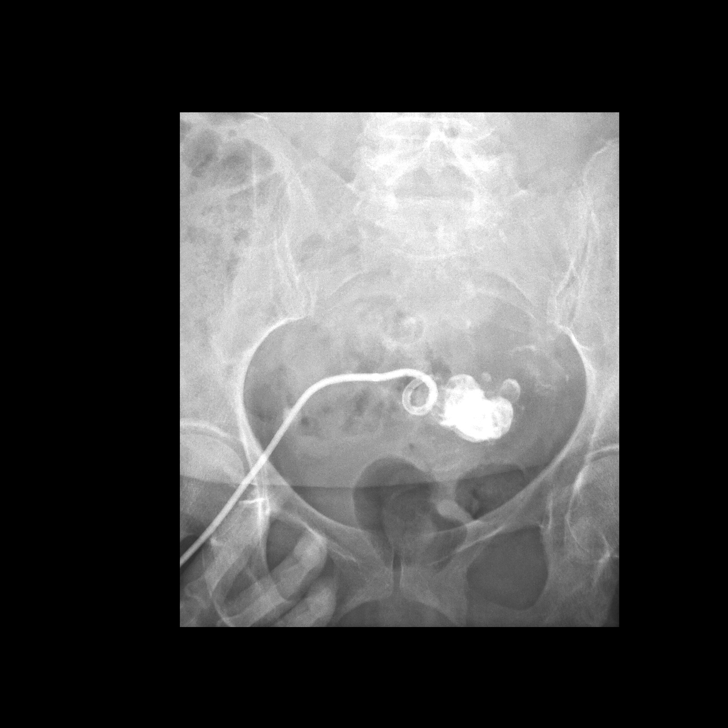
[im 8/9]
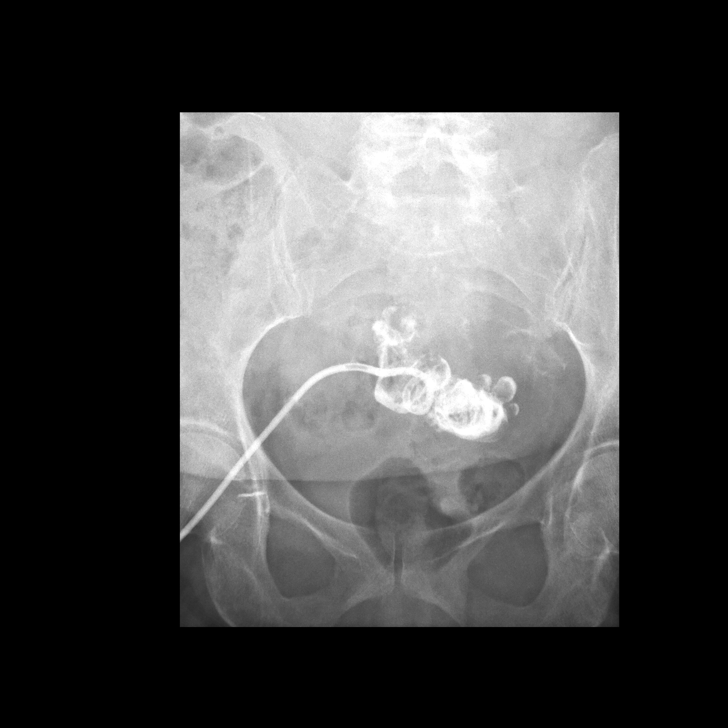
[im 9/9]
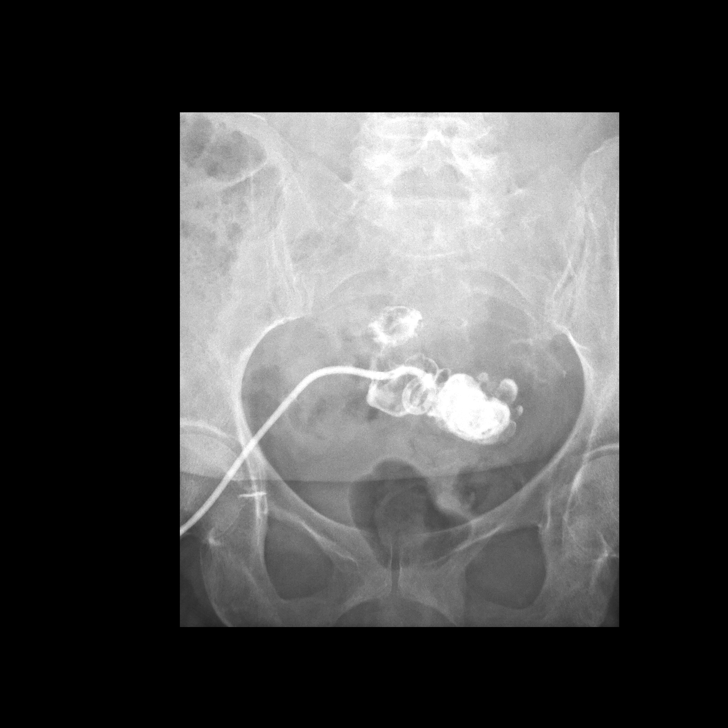

[9 of 9 positions shown; findings below may reference images not displayed]

EXAM:
ABSCESS INJECTION

MEDICATIONS:
None

ANESTHESIA/SEDATION:
None

FLUOROSCOPY TIME:  1 minutes and 12 seconds, 21 mGy

CONTRAST:  10 mL Omnipaque 300

COMPLICATIONS:
None immediate.

PROCEDURE:
The pelvic drain was injected with contrast under fluoroscopy.
Catheter was slightly pulled back and additional contrast was
injected. Catheter was flushed with saline and secured to skin.
FINDINGS: The pigtail catheter is in a stable position within the pelvis.
Injection contrast demonstrates immediate filling of the adjacent
sigmoid colon. There is no residual abscess collection around the
drain. Some of the contrast drains around the drain towards the
skin. Catheter was slightly pulled back and still remains adjacent
to the sigmoid colon.
IMPRESSION: Persistent colonic fistula.  No residual abscess collection.

The drainage catheter was intentionally slightly pulled back and
will continue drain flushing and gravity drainage. Recommend
surgical consultation. This drain has been present since 09/21/2016.
Patient will return in 1-2 weeks for follow-up drain injection.

## 2017-10-04 IMAGING — CR DG CHEST 1V PORT
1 series · 1 of 1 positions shown · non-contrast
Comparison: Chest CT 09/22/2016; chest radiograph 10/11/2016.

CLINICAL DATA: Patient with coarse breath sounds.  Confusion.

EXAM:
PORTABLE CHEST 1 VIEW

[AP]
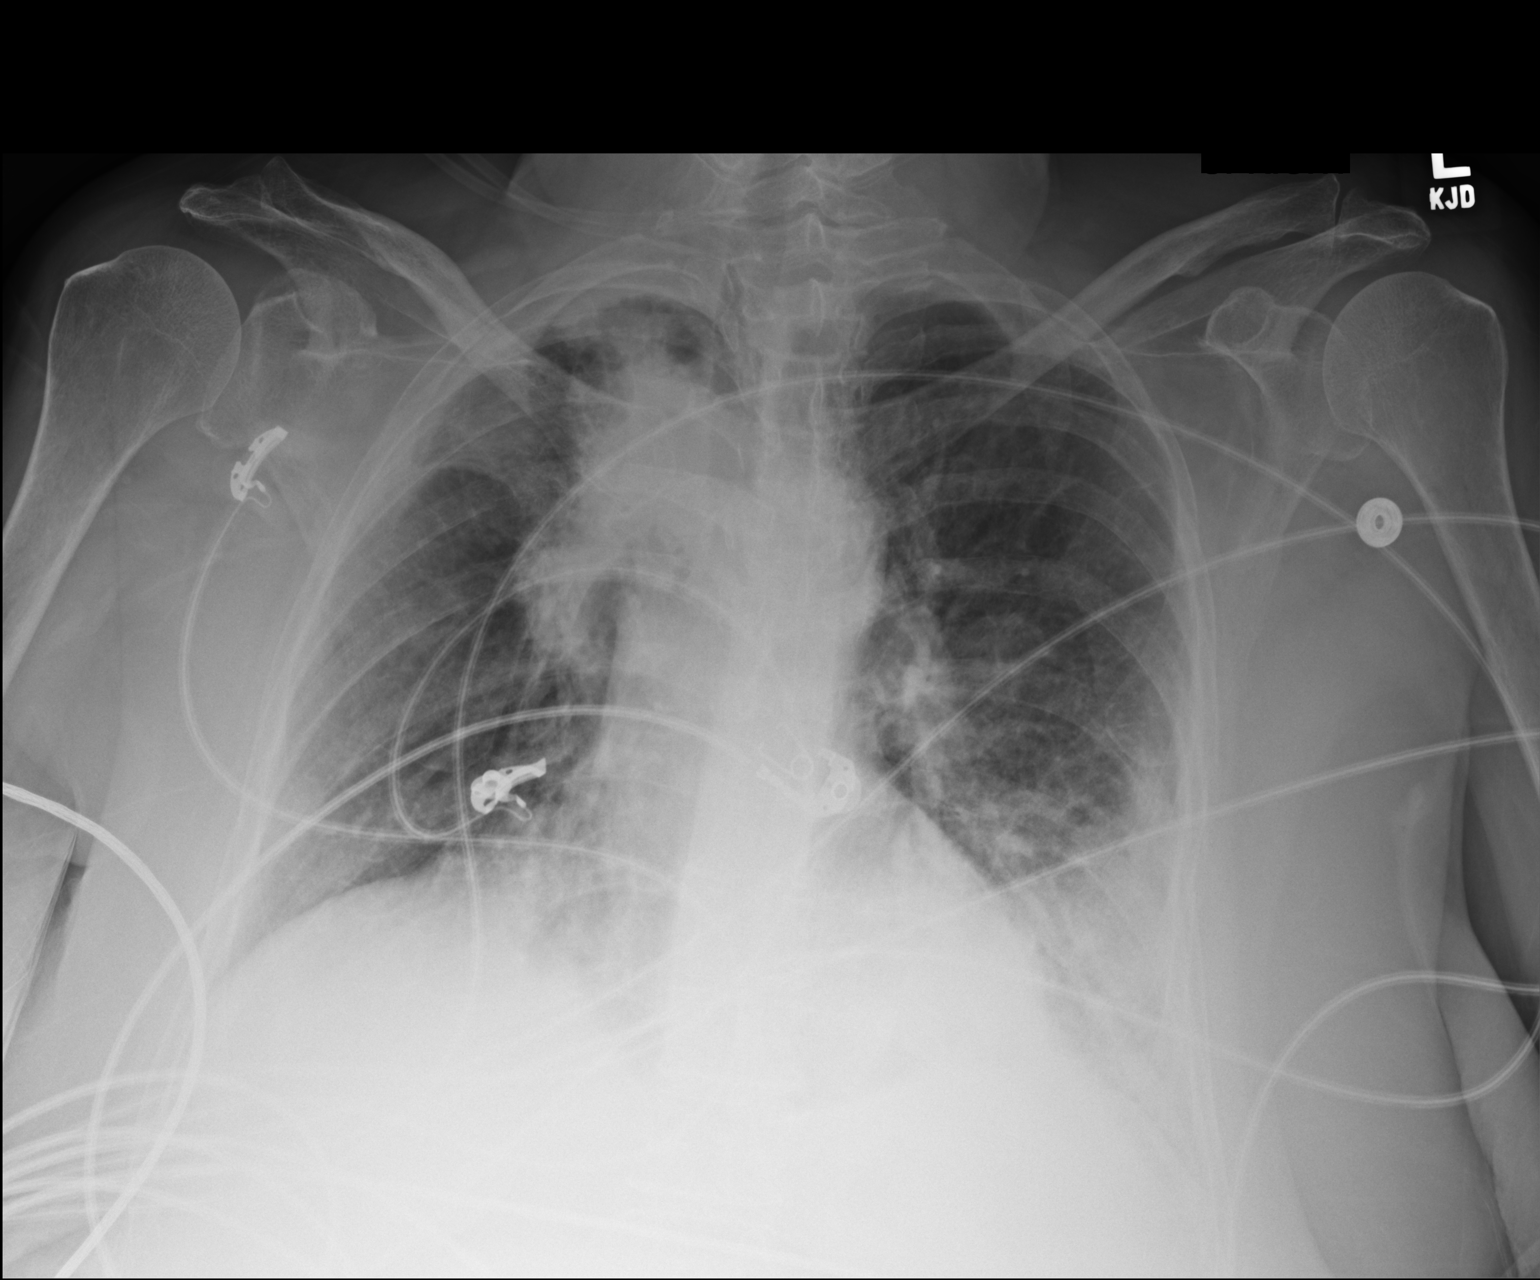

[1 of 1 positions shown; findings below may reference images not displayed]

FINDINGS: Interval increase in right upper lobe paramediastinal consolidation.
Interval development of consolidative opacities within the left mid
lower lung. Stable cardiac and mediastinal contours. Probable small
left pleural effusion.
IMPRESSION: Interval development of consolidation within the left mid and lower
lung which may represent pneumonia in the appropriate clinical
setting.

Interval increase in consolidation within the paramediastinal right
lung. This may represent evolving postradiation change however
locally recurrent disease is not excluded. After resolution of the
acute symptomatology, recommend correlation with chest CT.

## 2017-10-05 IMAGING — CT CT ABD-PELV W/ CM
2 of 5 series · 16 of 46 positions shown, 18 images · IV contrast (APPLIED)
Comparison: 10/06/2016

CLINICAL DATA: History of hypertension and diverticulitis with
colonic fistula

EXAM:
CT ABDOMEN AND PELVIS WITH CONTRAST
TECHNIQUE: Multidetector CT imaging of the abdomen and pelvis was performed
using the standard protocol following bolus administration of
intravenous contrast.
CONTRAST:  100mL [id number] IOPAMIDOL ([id number]) INJECTION 61%

[Series 2: abd/ pelvis 5.0 i30f 1 · axial · 0.79mm/px · z∈[+920,+1335]mm · 13 of 95 slices shown, 15 images]
[im 6/95  soft-tissue]
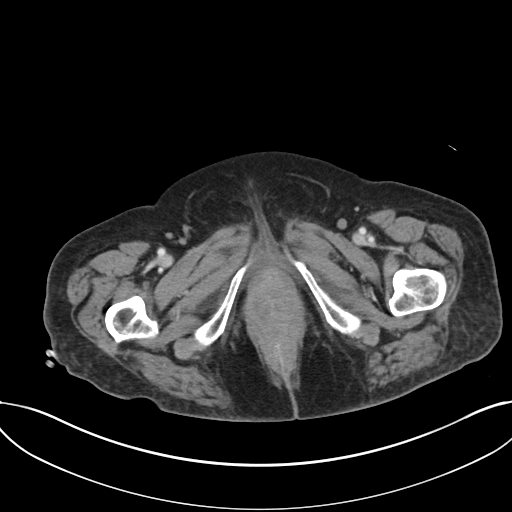
[im 6/95  bone]
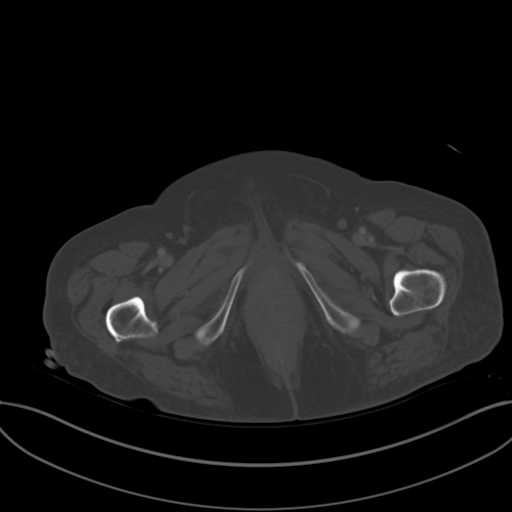
[im 11/95  soft-tissue]
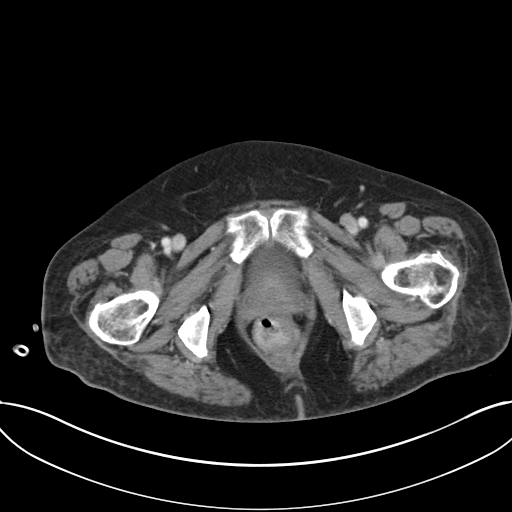
[im 21/95  soft-tissue]
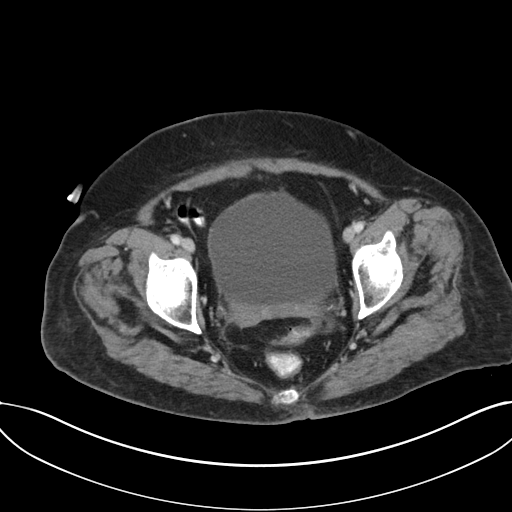
[im 27/95  soft-tissue]
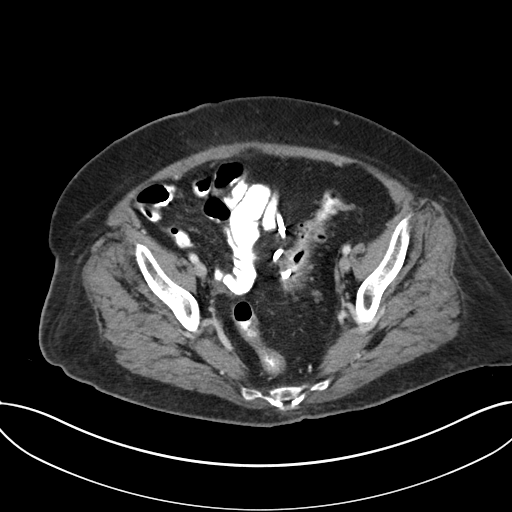
[im 32/95  soft-tissue]
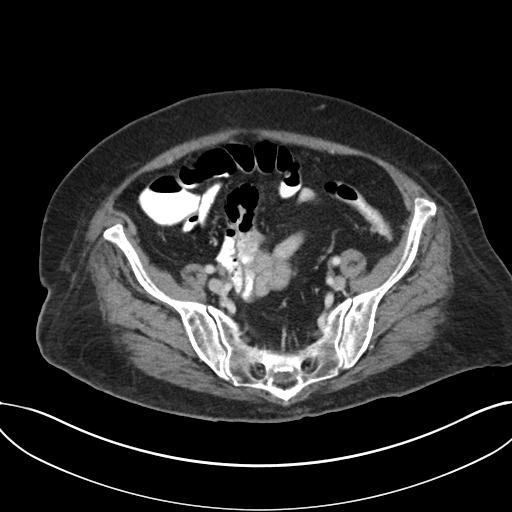
[im 42/95  soft-tissue]
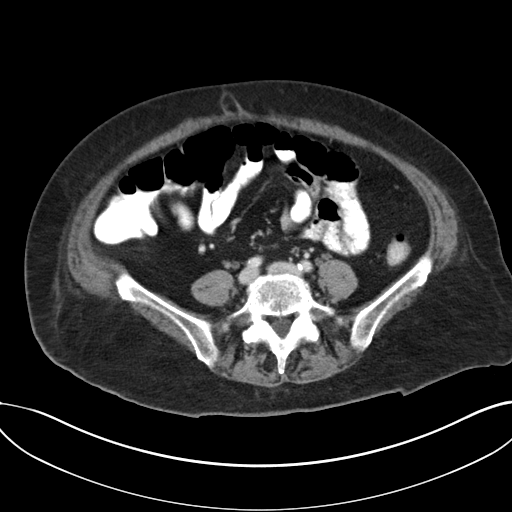
[im 48/95  soft-tissue]
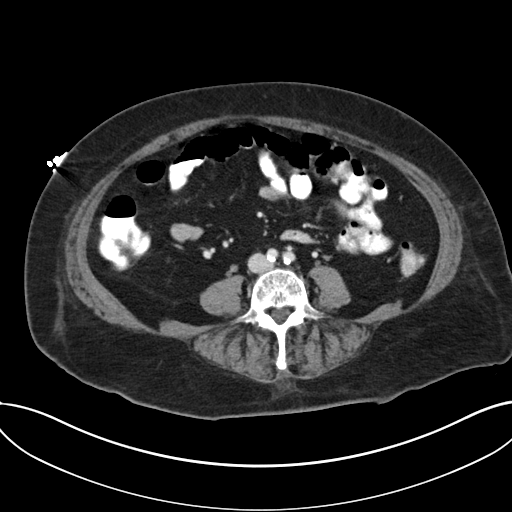
[im 53/95  soft-tissue]
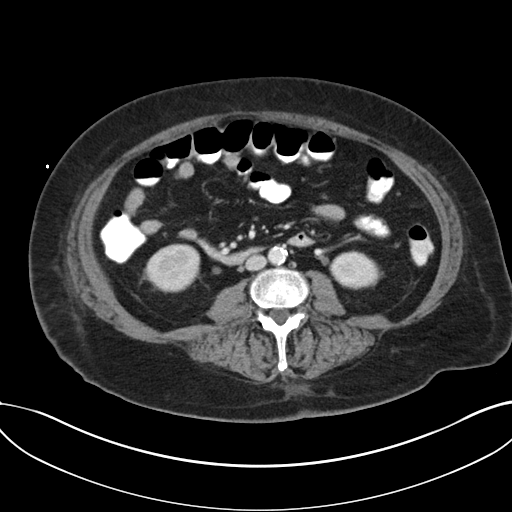
[im 63/95  soft-tissue]
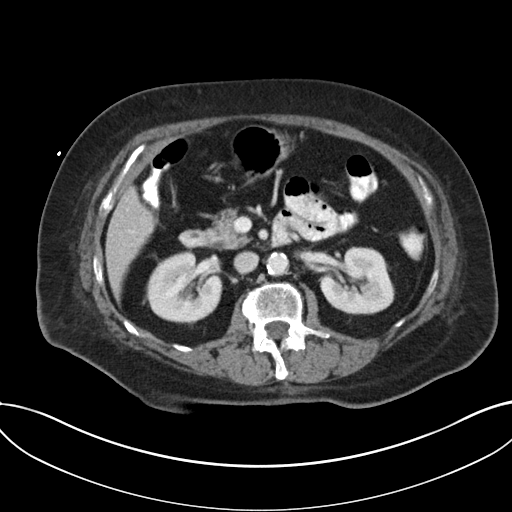
[im 63/95  bone]
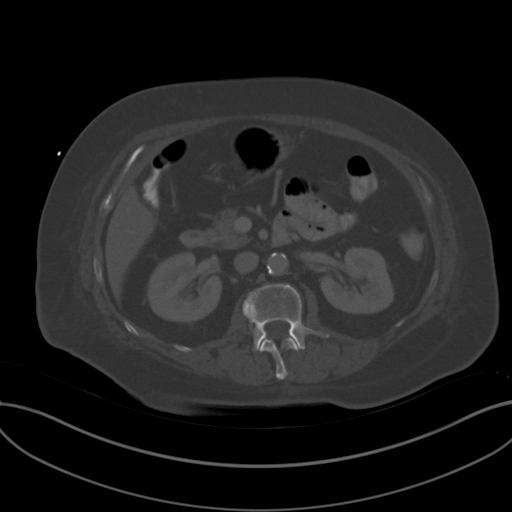
[im 68/95  soft-tissue]
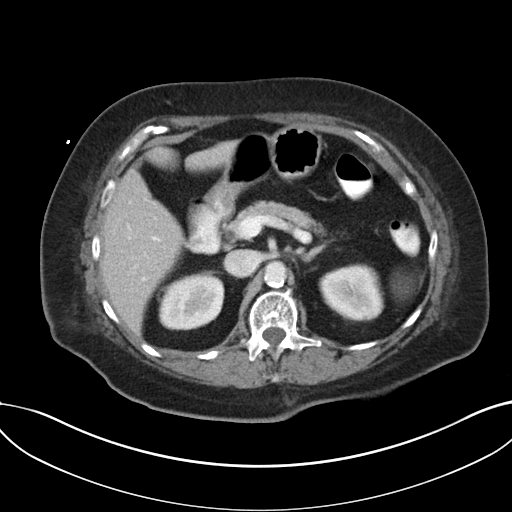
[im 74/95  soft-tissue]
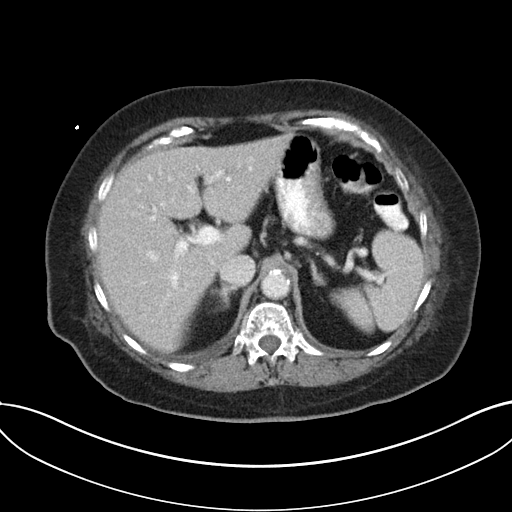
[im 84/95  soft-tissue]
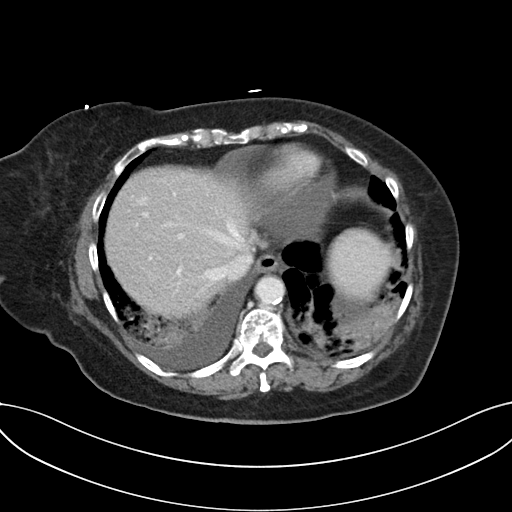
[im 89/95  soft-tissue]
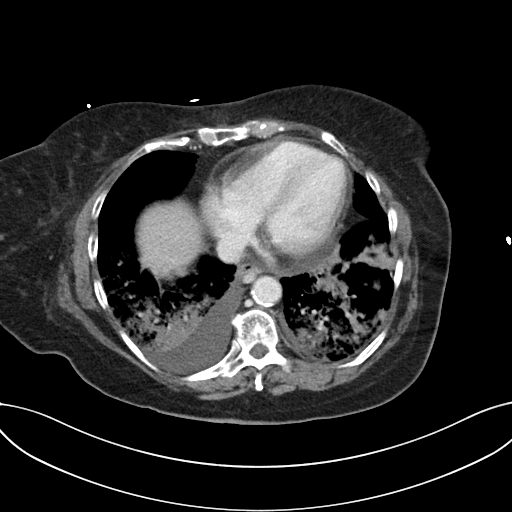

[Series 5: coronal soft tissue · coronal · 0.81mm/px · 3 of 96 slices shown]
[im 32/96  soft-tissue]
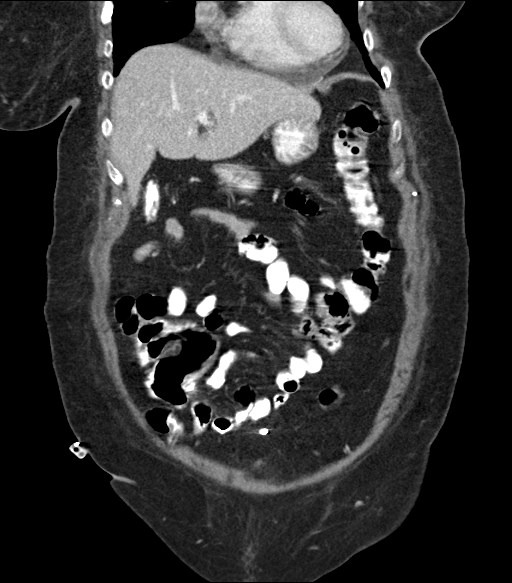
[im 43/96  soft-tissue]
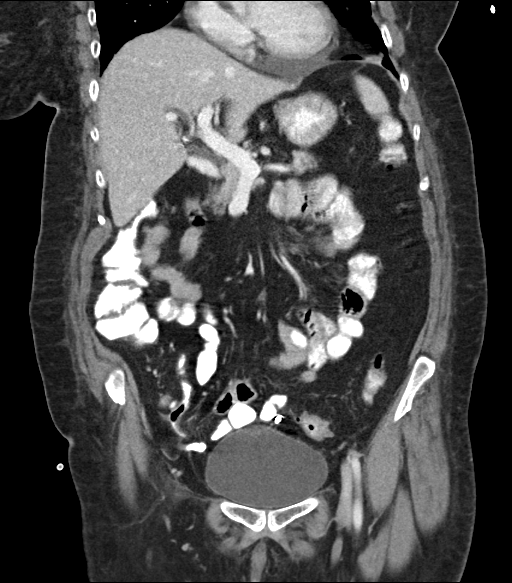
[im 53/96  soft-tissue]
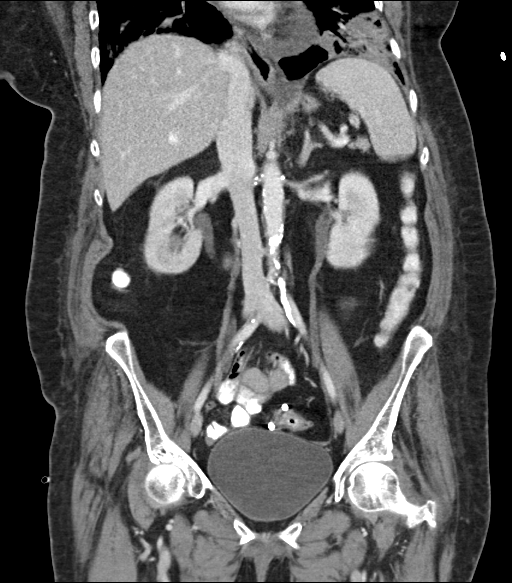

[16 of 46 positions shown; findings below may reference images not displayed]

FINDINGS: Lower chest: New bilateral small pleural effusions are noted as well
as bilateral pneumonia. Mild pericardial effusion is noted.

Hepatobiliary: No focal liver abnormality is seen. Status post
cholecystectomy. No biliary dilatation.

Pancreas: Unremarkable. No pancreatic ductal dilatation or
surrounding inflammatory changes.

Spleen: Normal in size without focal abnormality.

Adrenals/Urinary Tract: Adrenal glands are unremarkable. Kidneys are
normal, without renal calculi, focal lesion, or hydronephrosis.
Bladder is unremarkable.

Stomach/Bowel: Changes of diverticulosis are identified. Drainage
catheter is noted adjacent to the sigmoid colon consistent with
history of prior abscess. No residual cavity is seen. The appendix
is within normal limits. No obstructive changes are noted.

Vascular/Lymphatic: Aortic atherosclerosis. No enlarged abdominal or
pelvic lymph nodes.

Reproductive: Status post hysterectomy. No adnexal masses.

Other: No abdominal wall hernia or abnormality. No abdominopelvic
ascites.

Musculoskeletal: No acute or significant osseous findings.
IMPRESSION: Bilateral pleural effusions and bibasilar pneumonia which are new
from the prior exam.

Diverticulosis without diverticulitis. A drainage catheter is noted
in place without significant residual abscess cavity.

Small pericardial effusion.

## 2017-10-08 IMAGING — XA IR FISTULA/SINUS TRACT
1 series · 5 of 5 positions shown · non-contrast
Comparison: CT scan abdomen and pelvis -09/11/2016;

CLINICAL DATA: History of diverticular abscess, post percutaneous
drainage catheter placement initially on 09/12/2016 and replacement
on 09/21/2016.
TECHNIQUE: The patient was positioned supine on the fluoroscopy table.

[Series 300: dsa body · 5 of 5 slices shown]
[im 1/5]
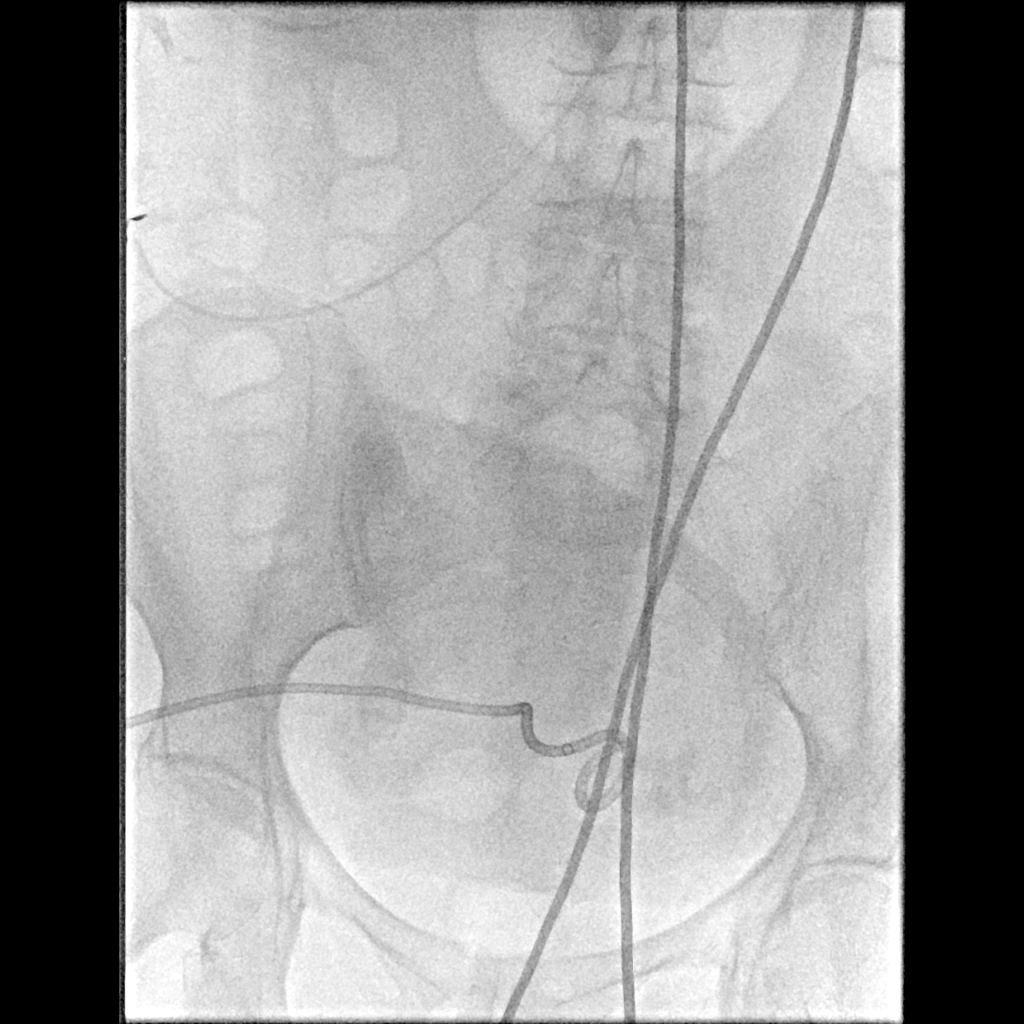
[im 2/5]
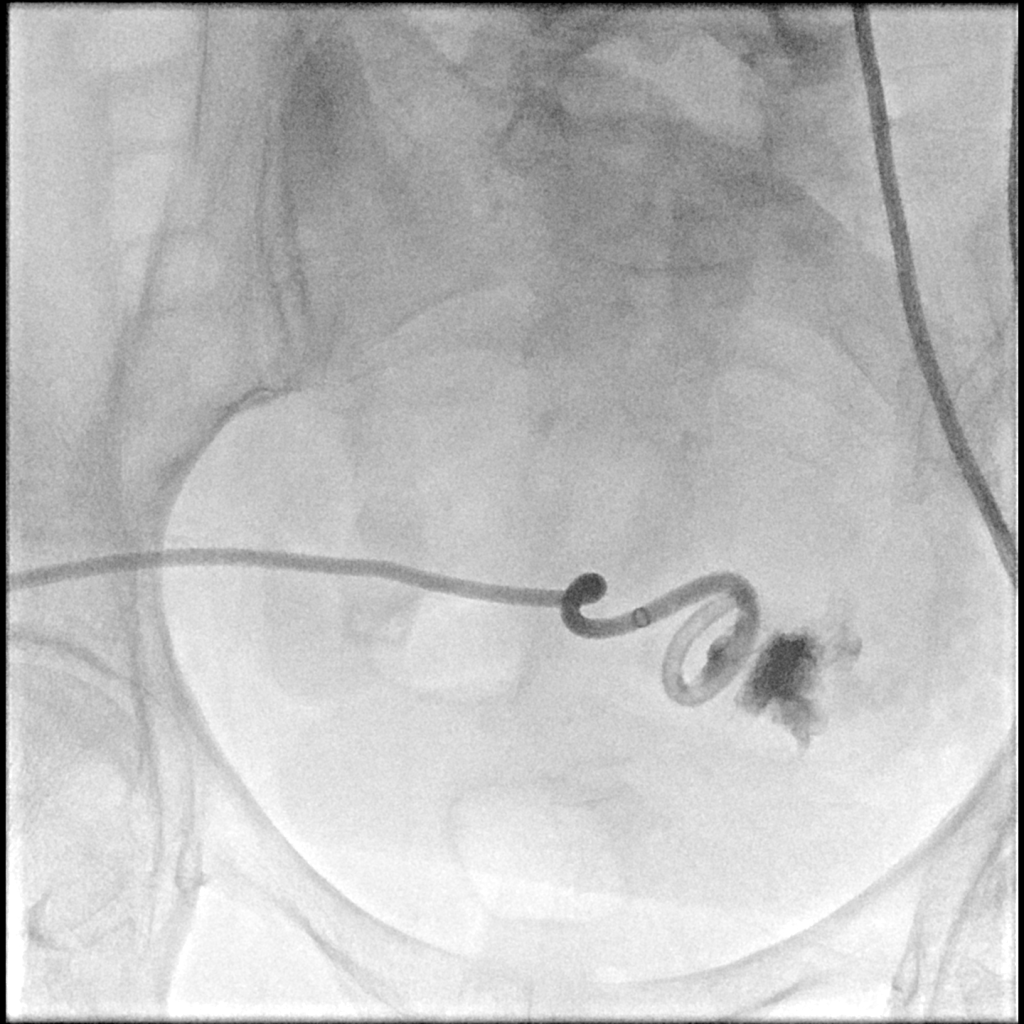
[im 3/5]
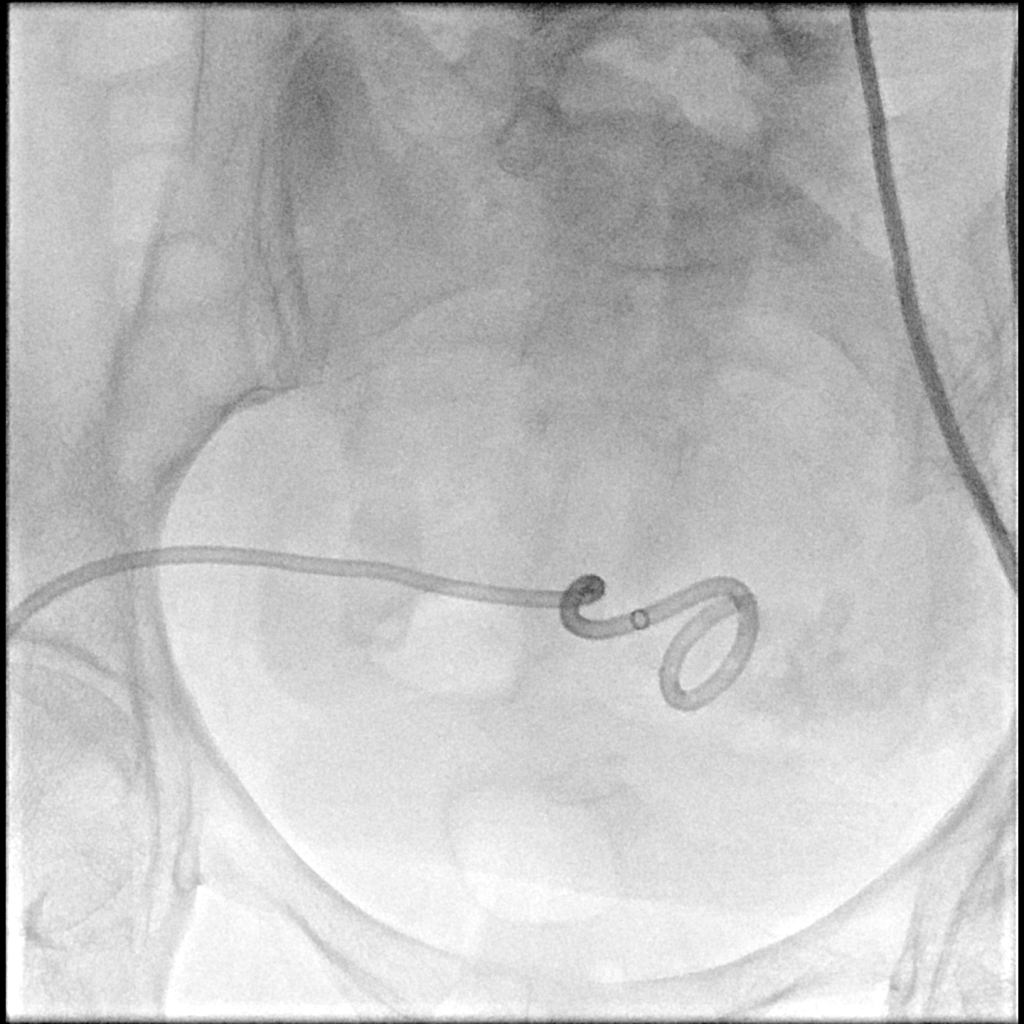
[im 4/5]
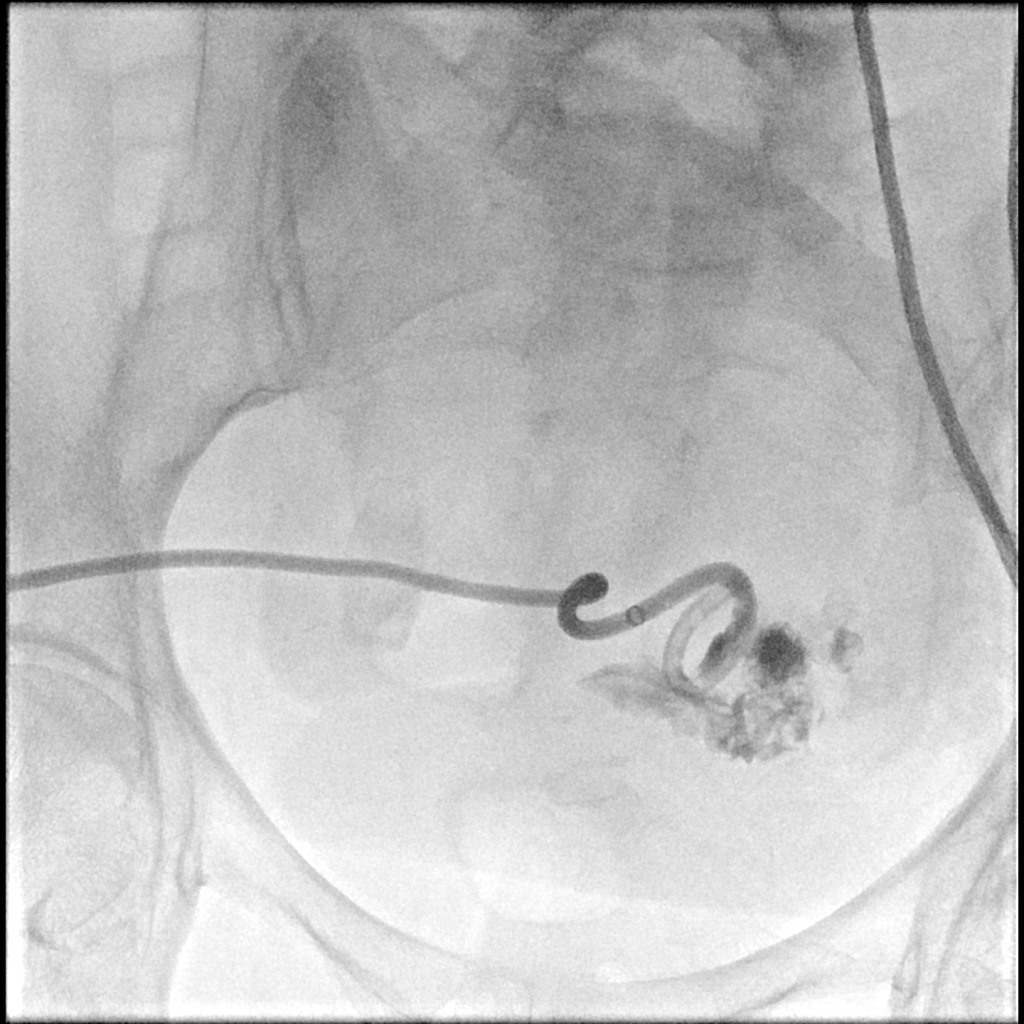
[im 5/5]
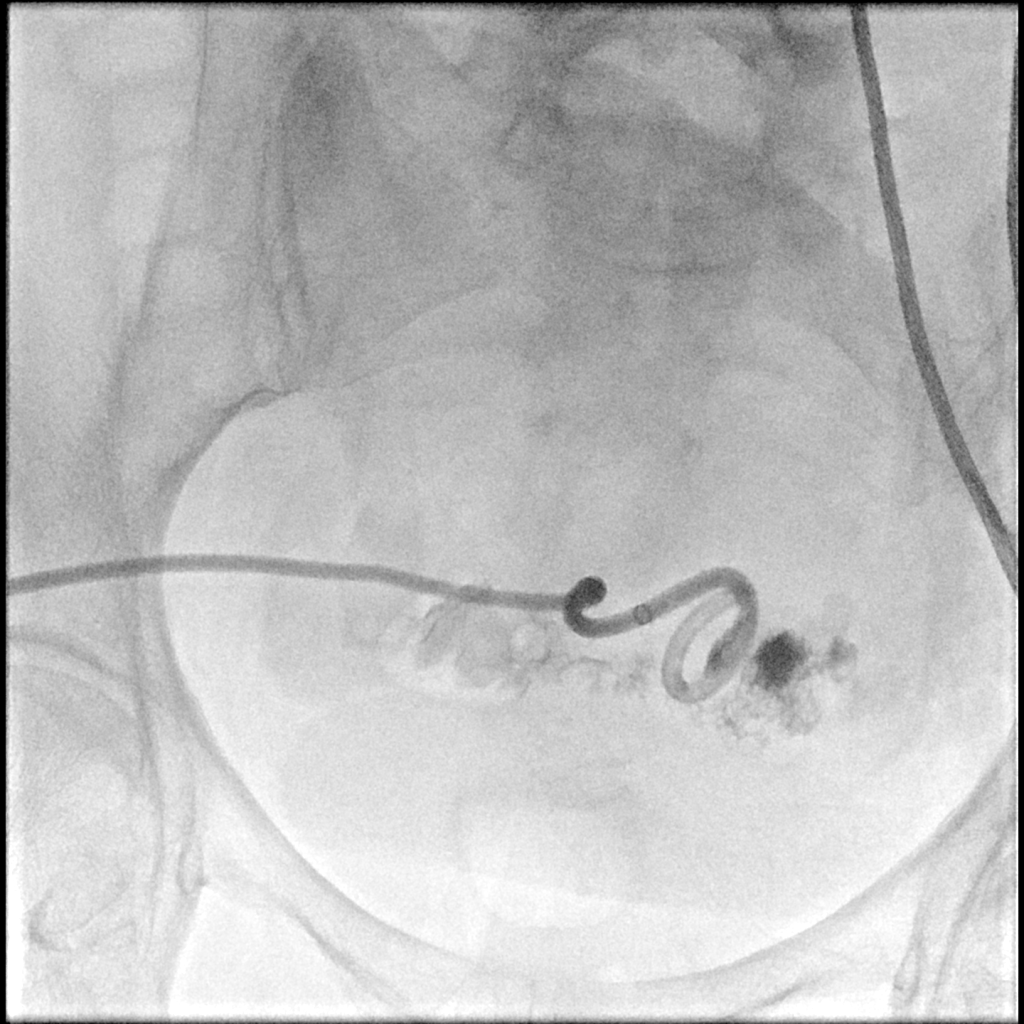

[5 of 5 positions shown; findings below may reference images not displayed]

CT scan of the abdomen and pelvis performed 10/06/2016 demonstrates
resolution of the diverticular abscess however subsequent
percutaneous drainage catheter injections performed 10/06/2016,
10/26/2016, 11/03/2016 and 11/24/2016 new all have demonstrated a
persistent fistulous connection between the end of the percutaneous
drainage catheter in the adjacent sigmoid colon.

Patient presents today for repeat fluoroscopic guided drainage
catheter injection.

The patient states she is intermittently flushing the percutaneous
drain.

EXAM:
SINUS TRACT INJECTION/FISTULOGRAM
10/06/2016;
CT-guided percutaneous drainage catheter placement- 09/12/2016;
09/21/2016; fluoroscopic guided percutaneous drainage catheter
injection - 10/06/2016; 10/26/2016; 11/03/2016; 11/24/2016

CONTRAST:  10 cc Isovue 300, administered via the existing
percutaneous drainage catheter

FLUOROSCOPY TIME:  12 seconds (1 mGy)
A preprocedural spot fluoroscopic image was obtained of the lower
abdomen and pelvis and existing percutaneous drainage catheter

Multiple spot fluoroscopic and radiographic images were obtained
following the injection of a small amount of contrast via the
existing percutaneous drainage catheter.

Images reviewed in the procedure was terminated. The drainage
catheter was flushed with a small amount of saline and reconnected
to a gravity bag.
FINDINGS: Preprocedural spot fluoroscopic image demonstrates unchanged
positioning of the percutaneous drainage catheter with and coiled
and locked over lying the midline of the lower pelvis

Contrast injection demonstrates opacification of the decompressed
abscess cavity however there is brisk communication between the end
of the percutaneous drainage catheter and the adjacent sigmoid
colon.
IMPRESSION: Persistent fistulous connection between the end of the percutaneous
drainage catheter and the adjacent sigmoid colon.

PLAN:
- the patient was instructed to no longer flush the percutaneous
drainage catheter.

- the patient was instructed to maintain diligent records regarding
drainage catheter output.

- the patient will follow-up in the [REDACTED] in 2 weeks for repeat drainage catheter injection and
evaluation and management

## 2017-10-12 IMAGING — DX DG CHEST 2V
2 series · 2 of 2 positions shown · non-contrast
Comparison: Radiographs December 03, 2016. CT scan September 22, 2016.

CLINICAL DATA: Shortness of breath, respiratory failure.

EXAM:
CHEST  2 VIEW

[w chest pa]
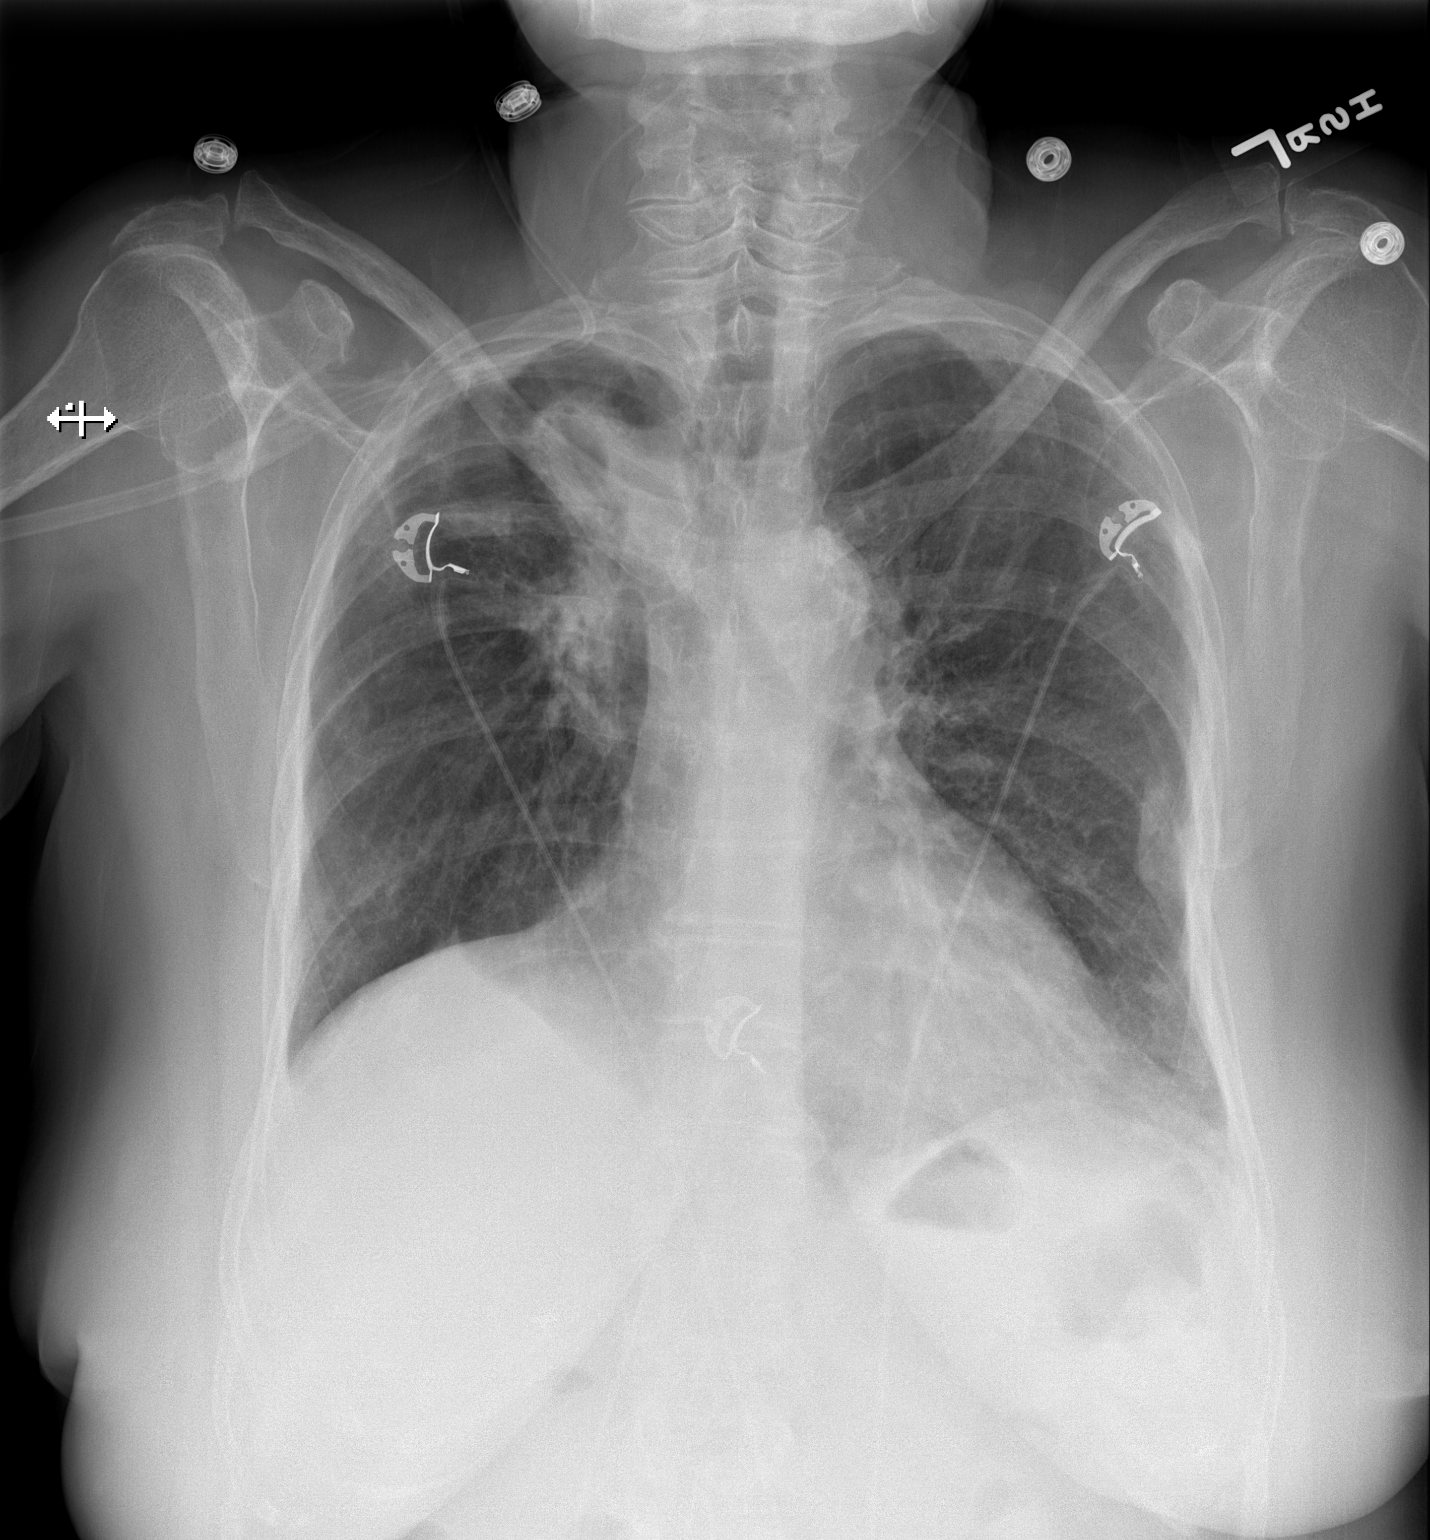

[w chest lat]
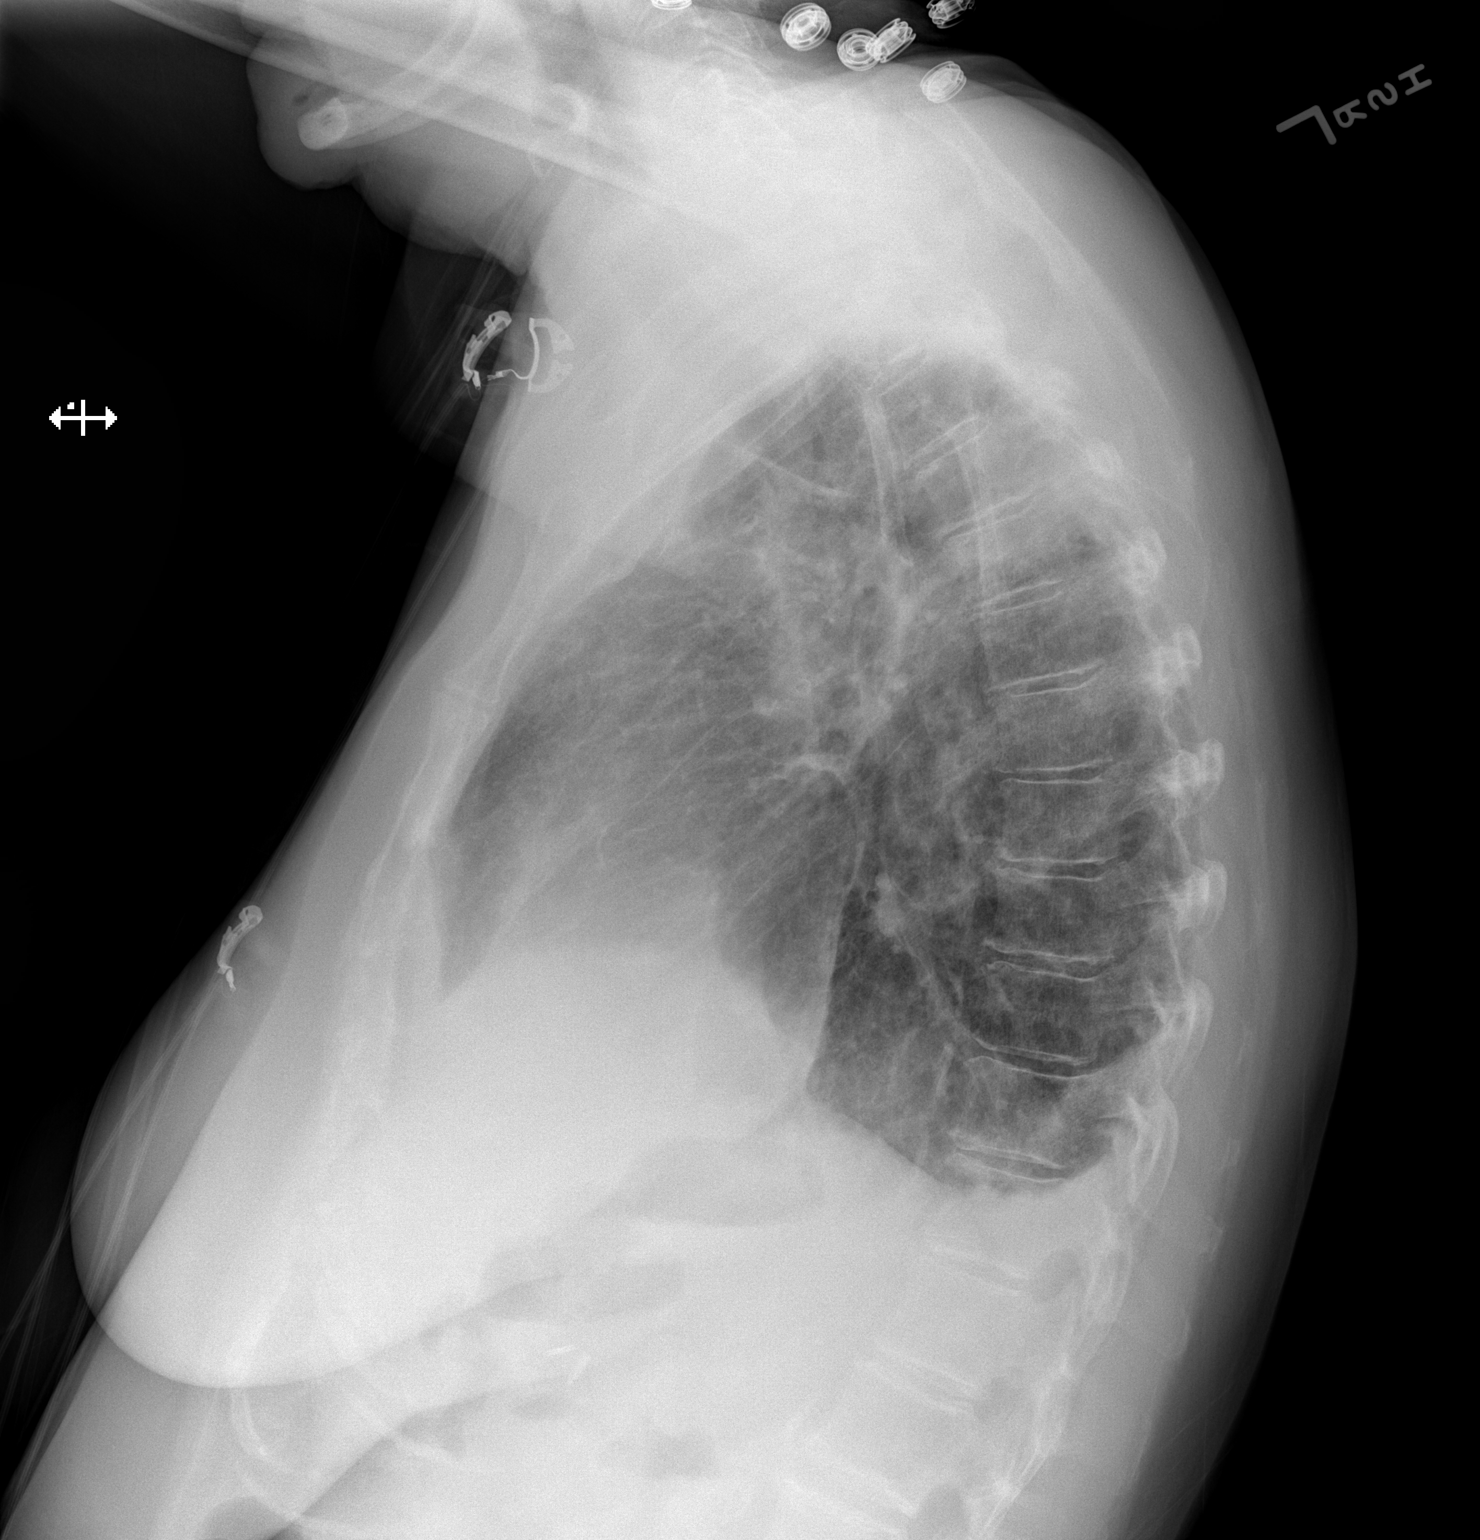

[2 of 2 positions shown; findings below may reference images not displayed]

FINDINGS: Stable right peritracheal prominence is noted which may represent
radiation fibrosis, although recurrent malignancy cannot be
excluded. No pneumothorax is noted. Mild right pleural effusion is
noted. Atherosclerosis of thoracic aorta is noted. Bony thorax is
unremarkable. Minimal left basilar subsegmental atelectasis is
noted.
IMPRESSION: Minimal left basilar subsegmental atelectasis. Mild right pleural
effusion. Stable right peritracheal prominence is noted which may
represent radiation fibrosis, although recurrent malignancy cannot
be excluded. CT scan of the chest may be performed further
evaluation. Aortic atherosclerosis.

## 2017-10-19 ENCOUNTER — Other Ambulatory Visit: Payer: Self-pay | Admitting: Family Medicine

## 2017-11-09 ENCOUNTER — Encounter: Payer: Self-pay | Admitting: *Deleted

## 2017-11-09 NOTE — Progress Notes (Signed)
Subjective:   [name] is a 72 y.o. female who presents for Medicare Annual (Subsequent) preventive examination.  Review of Systems:  No ROS.  Medicare Wellness Visit. Additional risk factors are reflected in the social history.  Cardiac Risk Factors include: advanced age (>61mn, >>14women);diabetes mellitus;dyslipidemia;family history of premature cardiovascular disease;hypertension;sedentary lifestyle   Sleep patterns: Sleeps 9 hours.  Home Safety/Smoke Alarms: Feels safe in home. Smoke alarms in place.  Living environment; residence and Firearm Safety: Lives with husband, daughter and grandkids in 1 story home.  Seat Belt Safety/Bike Helmet: Wears seat belt.   Female:   [id number]      Mammo-03/16/2017, Negative. Ordered today.  Dexa scan-04/06/16, Osteopenia. Ordered today.     CCS-Colonoscopy 10/02/15, Benign Polyps. Recall 5 years     Objective:     Vitals: BP 127/67   Pulse 84   Temp (!) 97.5 F (36.4 C)   Resp 16   Ht 5' 2"  (1.575 m)   Wt 157 lb (71.2 kg)   SpO2 95%   BMI 28.72 kg/m   Body mass index is 28.72 kg/m.  Advanced Directives 11/10/2017 09/12/2017 09/05/2017 12/02/2016 12/02/2016 12/01/2016 10/31/2016  Does Patient Have a Medical Advance Directive? Yes No No No No No Yes  Type of AParamedicof ANew [place]Living will - - - - - HPress photographerOut of facility DNR (pink MOST or yellow form)  Does patient want to make changes to medical advance directive? - - - - - - No - Patient declined  Copy of [place] Chart? Yes - - - - - No - copy requested  Would patient like information on creating a medical advance directive? - Yes (MAU/Ambulatory/Procedural Areas - Information given) - Yes (Inpatient - patient requests chaplain consult to create a medical advance directive) Yes (Inpatient - patient requests chaplain consult to create a medical advance directive) - -  Pre-existing out of facility DNR order  (yellow form or pink MOST form) - - - - - - Physician notified to receive inpatient order    Tobacco Social History   Tobacco Use  Smoking Status Former Smoker  . Packs/day: 1.00  . Years: 45.00  . Pack years: 45.00  . Types: Cigarettes  . Last attempt to quit: 10/24/2008  . Years since quitting: 9.0  Smokeless Tobacco Never Used     Counseling given: Not Answered   Past Medical History:  Diagnosis Date  . Age-related nuclear cataract of both eyes 2016   +cortical age related cataracts OU   . Allergy    seasonal  . Arthritis    hands  . Bilateral diabetic retinopathy (HCC) 2015   Dr. MZigmund Daniel . Blood transfusion without reported diagnosis    when taking chemo  . Chronic diastolic heart failure (HCC)    Takotsubo CM (resolved @5 /2018):  a. 08/2016 Echo: EF 30-35%, gr1DD, PASP 370mg;  b. 11/2016 Echo: EF 40-45%, no rwma, nl RV fxn, mild TR, PASP 3751m.  . CKD (chronic kidney disease), stage II    GFR 60s  . 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  . CMarland KitchenPD (chronic obstructive pulmonary disease) ([place] . CVA (cerebral vascular accident) ([place]   MRI did show a left-sided small ischemic stroke which was acute but likely incidental (MRI was done b/c pt had TIA sx's in hospital).  Neuro put her on plavix at that time.  Cardiology d/c'd her plavix  08/2017.  . Diabetes mellitus with complication (HCC)    diab retinopathy OU (laser)  . GERD (gastroesophageal reflux disease)    protonix  . History of diverticulitis of colon    with abscess; required IR percutaneous drain placed 09/2016.  Marland Kitchen Hyperlipidemia   . Hypertension   . Left bundle branch block (LBBB) 12/16/2016  . Lung cancer ([place])    non-small cell lung ca, stage III in 05/2011; systemic chemotherapy concurrent with radiation followed by prophylactic cranial irradiation and has been observation since July of 2010 with no evidence for disease recurrence-released from onc f/u  12/2014 (needs annual cxr by PCP).  CXR 08/2015 stable.  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 Earlie Server did bx and this was NEG for malignancy.  . Mild cognitive impairment with memory loss    Likely from brain radiation therapy  . Non-obstructive CAD (coronary artery disease)    a. Cath 2006 preserved LV fxn, scattered irregularities without critical stenosis; b. 2008 stress echo negative for ischemia, but with hypertensive response; c. 08/2016 Cath: D1 25%, otw nl.  . Normocytic anemia 2018   Iron, vit B12, folate all normal 12/2016.  Marland Kitchen Open-angle glaucoma 2016   Dr. Shirley Muscat, (bilateral)---responding to topical therapy  . Osteopenia 03/2016   03/2016 DEXA T-score -2.2  . Pneumonia    hospitalization 11/2016; hypoxemic resp failure--d/c'd home with home oxygen therapy  . Subclinical hyperthyroidism 2018  . Takotsubo cardiomyopathy    a. 08/2016 Echo: EF 30-35%, gr1DD, PASP 46mHg;  b. 08/2016 Cath: nonobs Dzs;  c. 11/2016 Echo: EF 40-45%, no rwma, nl RV fxn, mild TR, PASP 389mg.  ECHO 02/2017 EF 50-55%, grd II DD---essentially resolved Takotsubo 02/2017.  . [place]orsades de pointes (HC[place]11/2017   a. 08/2016 in setting of diverticulitis & pneumoperitoneum and Takotsubo CM -->prolonged QT, seen by EP-->avoid meds with potential for QT prolongation.   Past Surgical History:  Procedure Laterality Date  . ABDOMINAL HYSTERECTOMY  1997  . APPENDECTOMY    . CARDIAC CATHETERIZATION N/A 09/14/2016   Procedure: Left Heart Cath and Coronary Angiography;  Surgeon: ThTroy SineMD;  [facility];  Service: Cardiovascular; Mild nonobstructive CAD with 85% smooth narrowing in the first diagonal branch of the LAD; 10% smooth narrowing of the ostial proximal left circumflex coronary artery; and a normal dominant RCA.  . Marland KitchenARPAL TUNNEL RELEASE    . CATARACT EXTRACTION    . CHOLECYSTECTOMY  2002  . COLONOSCOPY  10/24/00   normal.  [facility] hemoccult testing via [place]/16 was NEG  . IR GENERIC  HISTORICAL  09/19/2016   IR SINUS/FIST TUBE CHK-NON GI 09/19/2016 JaCorrie MckusickDO [facility]  . IR GENERIC HISTORICAL  10/06/2016   IR RADIOLOGIST EVAL & MGMT 10/06/2016 [facility]  . IR GENERIC HISTORICAL  10/26/2016   IR RADIOLOGIST EVAL & MGMT 10/26/2016 [facility]  . IR GENERIC HISTORICAL  11/03/2016   IR RADIOLOGIST EVAL & MGMT 11/03/2016 WeArdis RowanPA-C [facility]  . IR GENERIC HISTORICAL  11/24/2016   IR RADIOLOGIST EVAL & MGMT 11/24/2016 WeArdis RowanPA-C [facility]  . IR GENERIC HISTORICAL  12/05/2016   Fistula smaller/improving.  IR SINUS/FIST TUBE CHK-NON GI 12/05/2016 JoSandi MariscalMD [facility]  . IR GENERIC HISTORICAL  12/22/2016   IR RADIOLOGIST EVAL & MGMT 12/22/2016 MiGreggory KeenMD [facility]  . IR RADIOLOGIST EVAL & MGMT  01/10/2017  . IR RADIOLOGIST EVAL & MGMT  02/09/2017  .  IR SINUS/FIST TUBE CHK-NON GI  03/28/2017  . LEFT HEART CATHETERIZATION WITH CORONARY ANGIOGRAM N/A 05/30/2012   Procedure: LEFT HEART CATHETERIZATION WITH CORONARY ANGIOGRAM;  Surgeon: Burnell Blanks, MD;  Location: [facility] CATH LAB;  Service: Cardiovascular: Mild, non-obstructive CAD  . TRANSTHORACIC ECHOCARDIOGRAM  09/14/2016; 11/2016   08/2016: EF 30-35 %, Akinesis of the mid-apicalanteroseptal myocardium.  Grade I DD.  Mild pulm HTN.  Repeat echo 11/2016, EF 40-45%, no rwma, nl RV fxn, mild TR, PASP 81mHg.    .Marland KitchenTRANSTHORACIC ECHOCARDIOGRAM  03/07/2017   EF 50-55%. Hypokinesis of the distal septum with overall low normal LV,  systolic function; mild diastolic dysfunction with elevated LV  filling pressure; mildly calcified aortic valve with mild AI; small pericardial effusion   Family History  Problem Relation Age of Onset  . Heart failure Mother   . Kidney disease Mother        renal failure  . Diabetes Mother   . Diabetes Father   . Diabetes Brother   . Heart disease Brother   . Heart attack Brother   . Heart attack Brother   . Heart attack  Brother   . Colon cancer Neg Hx    Social History   Socioeconomic History  . Marital status: Married    Spouse name: Not on file  . Number of children: Not on file  . Years of education: Not on file  . Highest education level: Not on file  Social Needs  . Financial resource strain: Not on file  . Food insecurity - worry: Not on file  . Food insecurity - inability: Not on file  . Transportation needs - medical: Not on file  . Transportation needs - non-medical: Not on file  Occupational History  . Occupation: Retired CPublic affairs consultant UNEMPLOYED  Tobacco Use  . Smoking status: Former Smoker    Packs/day: 1.00    Years: 45.00    Pack years: 45.00    Types: Cigarettes    Last attempt to quit: 10/24/2008    Years since quitting: 9.0  . Smokeless tobacco: Never Used  Substance and Sexual Activity  . Alcohol use: No    Alcohol/week: 0.0 oz  . Drug use: No  . Sexual activity: Not on file  Other Topics Concern  . Not on file  Social History Narrative   [place] and [place]. Daughter's family is living with her and husband at present. She is ambulatory daily without cane or walker.     Outpatient Encounter Medications as of 11/10/2017  Medication Sig  . acetaminophen (TYLENOL) 500 MG tablet Take 500-1,000 mg by mouth every 6 (six) hours as needed for headache (or pain).   .Marland Kitchenalbuterol (PROAIR HFA) 108 (90 Base) MCG/ACT inhaler Inhale 2 puffs into the lungs every 6 (six) hours as needed for wheezing or shortness of breath.  .Marland Kitchenalbuterol (PROVENTIL) (2.5 MG/3ML) 0.083% nebulizer solution USE ONE VIAL IN NEBULIZER EVERY 4 HOURS AS NEEDED FOR WHEEZING OR SHORTNESS OF BREATH  . aspirin 81 MG chewable tablet Chew 81 mg by mouth daily.  .Marland Kitchenatorvastatin (LIPITOR) 80 MG tablet Take 1 tablet (80 mg total) by mouth daily.  . cetirizine (ZYRTEC) 10 MG tablet Take 10 mg by mouth daily.  .Marland Kitchenescitalopram (LEXAPRO) 5 MG tablet Take 1 tablet (5 mg total) by  mouth daily.  . fluticasone (FLOVENT HFA) 220 MCG/ACT inhaler Inhale 2 puffs into the lungs 2 (two) times daily.  .Marland Kitchen  furosemide (LASIX) 40 MG tablet Take 0.5 tablets (20 mg total) by mouth daily.  Marland Kitchen glucose blood test strip Use to check blood sugar three times daily  . HUMULIN 70/30 KWIKPEN (70-30) 100 UNIT/ML PEN Per sliding scale, takes this qAM and aPM:  140-199 - 4 units, 200-250 - 6 units, 251-299 - 8 units,  300-349 - 10 units,  350 or above 12 units. Dispense syringes and needles as needed, Ok to switch to PEN if approved. Substitute to any brand approved. DX DM2, Code E11.65  . HYDROcodone-acetaminophen (NORCO/VICODIN) 5-325 MG tablet Take 2 tablets by mouth every 4 (four) hours as needed.  . metoprolol tartrate (LOPRESSOR) 25 MG tablet TAKE 25 MG ( 1 TABLET)  IN THE MORNING , TAKE 50 MG ( 2 TABLETS) AT BEDTIME  . nitroGLYCERIN (NITROSTAT) 0.4 MG SL tablet Place 1 tablet (0.4 mg total) under the tongue every 5 (five) minutes as needed. For chest pain  . ONE TOUCH ULTRA TEST test strip USE  STRIP TO CHECK GLUCOSE THREE TIMES DAILY  . ONETOUCH DELICA LANCETS 37T MISC TEST 3 TIMES A DAY  . pantoprazole (PROTONIX) 40 MG tablet Take 1 tablet (40 mg total) by mouth daily.  . potassium chloride (K-DUR) 10 MEQ tablet TAKE 1 TABLET BY MOUTH ONCE DAILY  . [DISCONTINUED] albuterol (PROVENTIL) (2.5 MG/3ML) 0.083% nebulizer solution USE 1 VIAL IN NEBULIZER EVERY 4 HOURS AS NEEDED FOR WHEEZING OR SHORTNESS OF BREATH (Patient not taking: Reported on 11/10/2017)   No facility-administered encounter medications on file as of 11/10/2017.     Activities of Daily Living In your present state of health, do you have any difficulty performing the following activities: 11/10/2017 02/06/2017  Hearing? N N  Vision? N N  Difficulty concentrating or making decisions? Y Y  Comment - forgetfulness  Walking or climbing stairs? N N  Dressing or bathing? N N  Doing errands, shopping? N Y  Conservation officer, nature and eating ? N N   Comment - daughter prepares meals  Using the Toilet? N N  In the past six months, have you accidently leaked urine? N N  Do you have problems with loss of bowel control? N N  Managing your Medications? N N  Comment - daughter assist with managing medications  Managing your Finances? N (No Data)  Comment - daughter and husband manage  Housekeeping or managing your Housekeeping? N N  Comment - daughter and husband  Some recent data might be hidden    Patient Care Team: [name], MD as PCP - General (Family Medicine) Rigoberto Noel, MD as Consulting Physician (Pulmonary Disease) Shirley Muscat Loreen Freud, MD as Consulting Physician (Optometry) Curt Bears, MD as Consulting Physician (Oncology) Hayden Pedro, MD as Consulting Physician (Ophthalmology) Almyra Deforest, [place] as Consulting Physician (Cardiology) Leonie Man, MD as Consulting Physician (Cardiology) Ralene Ok, MD as Consulting Physician (General Surgery)    Assessment:   This is a routine wellness examination for Greidys.  Exercise Activities and Dietary recommendations Current Exercise Habits: The patient does not participate in regular exercise at present, Exercise limited by: cardiac condition(s)   Diet (meal preparation, eat out, water intake, caffeinated beverages, dairy products, fruits and vegetables): Drinks tea with artificial sweetener and coffee.   Late Breakfast: cereal Lunch: skips Dinner: protein and vegetables.    Goals    . Increase physical activity     Increase walking       Fall Risk Fall Risk  11/10/2017 02/06/2017 12/20/2016 10/04/2016 07/05/2016  Falls in the past year? Yes Yes No Yes Yes  Comment Fell at car d/t rain - - - Rolled out of bed, bruised hip  Number falls in past yr: 1 1 - 2 or more 1  Comment - took shower stool out and was sitting on it to get dressed and slipped off the shower chair. - - -  Injury with Fall? Yes No - Yes No  Risk Factor Category  - High Fall  Risk - High Fall Risk -  Risk for fall due to : - Impaired mobility;History of fall(s) - History of fall(s);Impaired balance/gait;Impaired mobility History of fall(s)  Follow up Falls prevention discussed Falls prevention discussed Falls prevention discussed Education provided;Falls prevention discussed Falls prevention discussed  Comment - - - - -    Depression Screen PHQ 2/9 Scores 11/10/2017 05/18/2017 02/06/2017 10/04/2016  PHQ - 2 Score 0 0 0 1  PHQ- 9 Score 6 9 - -     Cognitive Function MMSE - Mini Mental State Exam 11/10/2017 07/05/2016  Orientation to time 5 2  Orientation to Place 5 5  Registration 3 3  Attention/ Calculation 3 5  Recall 2 3  Language- name 2 objects 2 2  Language- repeat 1 1  Language- follow 3 step command 3 3  Language- read & follow direction 1 1  Write a sentence 1 1  Copy design 1 1  Total score 27 27        Immunization History  Administered Date(s) Administered  . Influenza Split 07/06/2012  . Influenza Whole 07/24/2006, 08/06/2007, 07/18/2008, 07/29/2009, 08/25/2010, 07/01/2011  . Influenza, High Dose Seasonal PF 07/03/2015, 07/05/2016, 08/10/2017  . Influenza,inj,Quad PF,6+ Mos 07/18/2013, 08/21/2014  . Pneumococcal Conjugate-13 02/05/2011  . Pneumococcal Polysaccharide-23 07/18/2008, 05/13/2014  . Tdap 06/10/2017  . Zoster 04/07/2015    Screening Tests Health Maintenance  Topic Date Due  . HEMOGLOBIN A1C  02/08/2018  . OPHTHALMOLOGY EXAM  03/01/2018  . FOOT EXAM  08/10/2018  . URINE MICROALBUMIN  08/10/2018  . COLONOSCOPY  10/01/2020  . INFLUENZA VACCINE  Completed  . DEXA SCAN  Completed  . Hepatitis C Screening  Completed  . PNA vac Low Risk Adult  Completed       Plan:    Call pharmacy about Shingrix (shingles vaccine)  Schedule mammogram and bone scan after July 1st.  Continue doing brain stimulating activities (puzzles, reading, adult coloring books, staying active) to keep memory sharp.   Call insurance regarding  [employer].   I have personally reviewed and noted the following in the patient's chart:   . Medical and social history . Use of alcohol, tobacco or illicit drugs  . Current medications and supplements . Functional ability and status . Nutritional status . Physical activity . Advanced directives . List of other physicians . Hospitalizations, surgeries, and ER visits in previous 12 months . Vitals . Screenings to include cognitive, depression, and falls . Referrals and appointments  In addition, I have reviewed and discussed with patient certain preventive protocols, quality metrics, and best practice recommendations. A written personalized care plan for preventive services as well as general preventive health recommendations were provided to patient.     Gerilyn Nestle, RN  11/10/2017

## 2017-11-10 ENCOUNTER — Ambulatory Visit (INDEPENDENT_AMBULATORY_CARE_PROVIDER_SITE_OTHER): Payer: Medicare Other | Admitting: Family Medicine

## 2017-11-10 ENCOUNTER — Encounter: Payer: Self-pay | Admitting: Family Medicine

## 2017-11-10 ENCOUNTER — Ambulatory Visit (INDEPENDENT_AMBULATORY_CARE_PROVIDER_SITE_OTHER): Payer: Medicare Other

## 2017-11-10 VITALS — BP 127/67 | HR 84 | Temp 97.5°F | Resp 16 | Ht 62.0 in | Wt 157.0 lb

## 2017-11-10 VITALS — BP 127/67 | HR 84 | Temp 97.5°F | Resp 16 | Ht 62.0 in | Wt 157.5 lb

## 2017-11-10 DIAGNOSIS — Z794 Long term (current) use of insulin: Secondary | ICD-10-CM

## 2017-11-10 DIAGNOSIS — Z1239 Encounter for other screening for malignant neoplasm of breast: Secondary | ICD-10-CM

## 2017-11-10 DIAGNOSIS — Z1231 Encounter for screening mammogram for malignant neoplasm of breast: Secondary | ICD-10-CM

## 2017-11-10 DIAGNOSIS — I10 Essential (primary) hypertension: Secondary | ICD-10-CM | POA: Diagnosis not present

## 2017-11-10 DIAGNOSIS — E1165 Type 2 diabetes mellitus with hyperglycemia: Secondary | ICD-10-CM

## 2017-11-10 DIAGNOSIS — Z0000 Encounter for general adult medical examination without abnormal findings: Secondary | ICD-10-CM

## 2017-11-10 DIAGNOSIS — E2839 Other primary ovarian failure: Secondary | ICD-10-CM

## 2017-11-10 DIAGNOSIS — E7800 Pure hypercholesterolemia, unspecified: Secondary | ICD-10-CM | POA: Diagnosis not present

## 2017-11-10 DIAGNOSIS — N182 Chronic kidney disease, stage 2 (mild): Secondary | ICD-10-CM | POA: Diagnosis not present

## 2017-11-10 LAB — BASIC METABOLIC PANEL
BUN: 17 mg/dL (ref 6–23)
CALCIUM: 8.9 mg/dL (ref 8.4–10.5)
CO2: 28 meq/L (ref 19–32)
CREATININE: 0.92 mg/dL (ref 0.40–1.20)
Chloride: 103 mEq/L (ref 96–112)
GFR: 64.26 mL/min (ref >60.00)
Glucose, Bld: 302 mg/dL — ABNORMAL HIGH (ref 70–99)
Potassium: 3.8 mEq/L (ref 3.5–5.1)
Sodium: 138 mEq/L (ref 135–145)

## 2017-11-10 LAB — HEMOGLOBIN A1C: Hgb A1c MFr Bld: 8.1 % — ABNORMAL HIGH (ref 4.6–6.5)

## 2017-11-10 NOTE — Patient Instructions (Addendum)
Call pharmacy about Shingrix (shingles vaccine)  Schedule mammogram and bone scan after July 1st.  Continue doing brain stimulating activities (puzzles, reading, adult coloring books, staying active) to keep memory sharp.  Call insurance regarding [place].   Fall Prevention in the Home Falls can cause injuries. They can happen to people of all ages. There are many things you can do to make your home safe and to help prevent falls. What can I do on the outside of my home?  Regularly fix the edges of walkways and driveways and fix any cracks.  Remove anything that might make you trip as you walk through a door, such as a raised step or threshold.  Trim any bushes or trees on the path to your home.  Use bright outdoor lighting.  Clear any walking paths of anything that might make someone trip, such as rocks or tools.  Regularly check to see if handrails are loose or broken. Make sure that both sides of any steps have handrails.  Any raised decks and porches should have guardrails on the edges.  Have any leaves, snow, or ice cleared regularly.  Use sand or salt on walking paths during winter.  Clean up any spills in your garage right away. This includes oil or grease spills. What can I do in the bathroom?  Use night lights.  Install grab bars by the toilet and in the tub and shower. Do not use towel bars as grab bars.  Use non-skid mats or decals in the tub or shower.  If you need to sit down in the shower, use a plastic, non-slip stool.  Keep the floor dry. Clean up any water that spills on the floor as soon as it happens.  Remove soap buildup in the tub or shower regularly.  Attach bath mats securely with double-sided non-slip rug tape.  Do not have throw rugs and other things on the floor that can make you trip. What can I do in the bedroom?  Use night lights.  Make sure that you have a light by your bed that is easy to reach.  Do not use any sheets or  blankets that are too big for your bed. They should not hang down onto the floor.  Have a firm chair that has side arms. You can use this for support while you get dressed.  Do not have throw rugs and other things on the floor that can make you trip. What can I do in the kitchen?  Clean up any spills right away.  Avoid walking on wet floors.  Keep items that you use a lot in easy-to-reach places.  If you need to reach something above you, use a strong step stool that has a grab bar.  Keep electrical cords out of the way.  Do not use floor polish or wax that makes floors slippery. If you must use wax, use non-skid floor wax.  Do not have throw rugs and other things on the floor that can make you trip. What can I do with my stairs?  Do not leave any items on the stairs.  Make sure that there are handrails on both sides of the stairs and use them. Fix handrails that are broken or loose. Make sure that handrails are as long as the stairways.  Check any carpeting to make sure that it is firmly attached to the stairs. Fix any carpet that is loose or worn.  Avoid having throw rugs at the top or bottom of  the stairs. If you do have throw rugs, attach them to the floor with carpet tape.  Make sure that you have a light switch at the top of the stairs and the bottom of the stairs. If you do not have them, ask someone to add them for you. What else can I do to help prevent falls?  Wear shoes that: ? Do not have high heels. ? Have rubber bottoms. ? Are comfortable and fit you well. ? Are closed at the toe. Do not wear sandals.  If you use a stepladder: ? Make sure that it is fully opened. Do not climb a closed stepladder. ? Make sure that both sides of the stepladder are locked into place. ? Ask someone to hold it for you, if possible.  Clearly mark and make sure that you can see: ? Any grab bars or handrails. ? First and last steps. ? Where the edge of each step is.  Use tools  that help you move around (mobility aids) if they are needed. These include: ? Canes. ? Walkers. ? Scooters. ? Crutches.  Turn on the lights when you go into a dark area. Replace any light bulbs as soon as they burn out.  Set up your furniture so you have a clear path. Avoid moving your furniture around.  If any of your floors are uneven, fix them.  If there are any pets around you, be aware of where they are.  Review your medicines with your doctor. Some medicines can make you feel dizzy. This can increase your chance of falling. Ask your doctor what other things that you can do to help prevent falls. This information is not intended to replace advice given to you by your health care provider. Make sure you discuss any questions you have with your health care provider. Document Released: 08/06/2009 Document Revised: 03/17/2016 Document Reviewed: 11/14/2014 Elsevier Interactive Patient Education  2018 [place] Maintenance, Female Adopting a healthy lifestyle and getting preventive care can go a long way to promote health and wellness. Talk with your health care provider about what schedule of regular examinations is right for you. This is a good chance for you to check in with your provider about disease prevention and staying healthy. In between checkups, there are plenty of things you can do on your own. Experts have done a lot of research about which lifestyle changes and preventive measures are most likely to keep you healthy. Ask your health care provider for more information. Weight and diet Eat a healthy diet  Be sure to include plenty of vegetables, fruits, low-fat dairy products, and lean protein.  Do not eat a lot of foods high in solid fats, added sugars, or salt.  Get regular exercise. This is one of the most important things you can do for your health. ? Most adults should exercise for at least 150 minutes each week. The exercise should increase your heart  rate and make you sweat (moderate-intensity exercise). ? Most adults should also do strengthening exercises at least twice a week. This is in addition to the moderate-intensity exercise.  Maintain a healthy weight  Body mass index (BMI) is a measurement that can be used to identify possible weight problems. It estimates body fat based on height and weight. Your health care provider can help determine your BMI and help you achieve or maintain a healthy weight.  For females 70 years of age and older: ? A BMI below 18.5 is considered underweight. ?  A BMI of 18.5 to 24.9 is normal. ? A BMI of 25 to 29.9 is considered overweight. ? A BMI of 30 and above is considered obese.  Watch levels of cholesterol and blood lipids  You should start having your blood tested for lipids and cholesterol at 70 years of age, then have this test every 5 years.  You may need to have your cholesterol levels checked more often if: ? Your lipid or cholesterol levels are high. ? You are older than 70 years of age. ? You are at high risk for heart disease.  Cancer screening Lung Cancer  Lung cancer screening is recommended for adults 32-42 years old who are at high risk for lung cancer because of a history of smoking.  A yearly low-dose CT scan of the lungs is recommended for people who: ? Currently smoke. ? Have quit within the past 15 years. ? Have at least a 30-pack-year history of smoking. A pack year is smoking an average of one pack of cigarettes a day for 1 year.  Yearly screening should continue until it has been 15 years since you quit.  Yearly screening should stop if you develop a health problem that would prevent you from having lung cancer treatment.  Breast Cancer  Practice breast self-awareness. This means understanding how your breasts normally appear and feel.  It also means doing regular breast self-exams. Let your health care provider know about any changes, no matter how small.  If  you are in your 20s or 30s, you should have a clinical breast exam (CBE) by a health care provider every 1-3 years as part of a regular health exam.  If you are 89 or older, have a CBE every year. Also consider having a breast X-ray (mammogram) every year.  If you have a family history of breast cancer, talk to your health care provider about genetic screening.  If you are at high risk for breast cancer, talk to your health care provider about having an MRI and a mammogram every year.  Breast cancer gene (BRCA) assessment is recommended for women who have family members with BRCA-related cancers. BRCA-related cancers include: ? Breast. ? Ovarian. ? Tubal. ? Peritoneal cancers.  Results of the assessment will determine the need for genetic counseling and BRCA1 and BRCA2 testing.  Cervical Cancer Your health care provider may recommend that you be screened regularly for cancer of the pelvic organs (ovaries, uterus, and vagina). This screening involves a pelvic examination, including checking for microscopic changes to the surface of your cervix (Pap test). You may be encouraged to have this screening done every 3 years, beginning at age 14.  For women ages 73-65, health care providers may recommend pelvic exams and Pap testing every 3 years, or they may recommend the Pap and pelvic exam, combined with testing for human papilloma virus (HPV), every 5 years. Some types of HPV increase your risk of cervical cancer. Testing for HPV may also be done on women of any age with unclear Pap test results.  Other health care providers may not recommend any screening for nonpregnant women who are considered low risk for pelvic cancer and who do not have symptoms. Ask your health care provider if a screening pelvic exam is right for you.  If you have had past treatment for cervical cancer or a condition that could lead to cancer, you need Pap tests and screening for cancer for at least 20 years after your  treatment. If Pap tests have  been discontinued, your risk factors (such as having a new sexual partner) need to be reassessed to determine if screening should resume. Some women have medical problems that increase the chance of getting cervical cancer. In these cases, your health care provider may recommend more frequent screening and Pap tests.  Colorectal Cancer  This type of cancer can be detected and often prevented.  Routine colorectal cancer screening usually begins at 71 years of age and continues through 70 years of age.  Your health care provider may recommend screening at an earlier age if you have risk factors for colon cancer.  Your health care provider may also recommend using home test kits to check for hidden blood in the stool.  A small camera at the end of a tube can be used to examine your colon directly (sigmoidoscopy or colonoscopy). This is done to check for the earliest forms of colorectal cancer.  Routine screening usually begins at age 23.  Direct examination of the colon should be repeated every 5-10 years through 70 years of age. However, you may need to be screened more often if early forms of precancerous polyps or small growths are found.  Skin Cancer  Check your skin from head to toe regularly.  Tell your health care provider about any new moles or changes in moles, especially if there is a change in a mole's shape or color.  Also tell your health care provider if you have a mole that is larger than the size of a pencil eraser.  Always use sunscreen. Apply sunscreen liberally and repeatedly throughout the day.  Protect yourself by wearing long sleeves, pants, a wide-brimmed hat, and sunglasses whenever you are outside.  Heart disease, diabetes, and high blood pressure  High blood pressure causes heart disease and increases the risk of stroke. High blood pressure is more likely to develop in: ? People who have blood pressure in the high end of the normal  range (130-139/85-89 mm Hg). ? People who are overweight or obese. ? People who are African American.  If you are 69-57 years of age, have your blood pressure checked every 3-5 years. If you are 15 years of age or older, have your blood pressure checked every year. You should have your blood pressure measured twice-once when you are at a hospital or clinic, and once when you are not at a hospital or clinic. Record the average of the two measurements. To check your blood pressure when you are not at a hospital or clinic, you can use: ? An automated blood pressure machine at a pharmacy. ? A home blood pressure monitor.  If you are between 26 years and 61 years old, ask your health care provider if you should take aspirin to prevent strokes.  Have regular diabetes screenings. This involves taking a blood sample to check your fasting blood sugar level. ? If you are at a normal weight and have a low risk for diabetes, have this test once every three years after 70 years of age. ? If you are overweight and have a high risk for diabetes, consider being tested at a younger age or more often. Preventing infection Hepatitis B  If you have a higher risk for hepatitis B, you should be screened for this virus. You are considered at high risk for hepatitis B if: ? You were born in a country where hepatitis B is common. Ask your health care provider which countries are considered high risk. ? Your parents were born in  a high-risk country, and you have not been immunized against hepatitis B (hepatitis B vaccine). ? You have HIV or AIDS. ? You use needles to inject street drugs. ? You live with someone who has hepatitis B. ? You have had sex with someone who has hepatitis B. ? You get hemodialysis treatment. ? You take certain medicines for conditions, including cancer, organ transplantation, and autoimmune conditions.  Hepatitis C  Blood testing is recommended for: ? Everyone born from [birth year] through  1963. ? Anyone with known risk factors for hepatitis C.  Sexually transmitted infections (STIs)  You should be screened for sexually transmitted infections (STIs) including gonorrhea and chlamydia if: ? You are sexually active and are younger than 70 years of age. ? You are older than 70 years of age and your health care provider tells you that you are at risk for this type of infection. ? Your sexual activity has changed since you were last screened and you are at an increased risk for chlamydia or gonorrhea. Ask your health care provider if you are at risk.  If you do not have HIV, but are at risk, it may be recommended that you take a prescription medicine daily to prevent HIV infection. This is called pre-exposure prophylaxis (PrEP). You are considered at risk if: ? You are sexually active and do not regularly use condoms or know the HIV status of your partner(s). ? You take drugs by injection. ? You are sexually active with a partner who has HIV.  Talk with your health care provider about whether you are at high risk of being infected with HIV. If you choose to begin PrEP, you should first be tested for HIV. You should then be tested every 3 months for as long as you are taking PrEP. Pregnancy  If you are premenopausal and you may become pregnant, ask your health care provider about preconception counseling.  If you may become pregnant, take 400 to 800 micrograms (mcg) of folic acid every day.  If you want to prevent pregnancy, talk to your health care provider about birth control (contraception). Osteoporosis and menopause  Osteoporosis is a disease in which the bones lose minerals and strength with aging. This can result in serious bone fractures. Your risk for osteoporosis can be identified using a bone density scan.  If you are 57 years of age or older, or if you are at risk for osteoporosis and fractures, ask your health care provider if you should be screened.  Ask your health  care provider whether you should take a calcium or vitamin D supplement to lower your risk for osteoporosis.  Menopause may have certain physical symptoms and risks.  Hormone replacement therapy may reduce some of these symptoms and risks. Talk to your health care provider about whether hormone replacement therapy is right for you. Follow these instructions at home:  Schedule regular health, dental, and eye exams.  Stay current with your immunizations.  Do not use any tobacco products including cigarettes, chewing tobacco, or electronic cigarettes.  If you are pregnant, do not drink alcohol.  If you are breastfeeding, limit how much and how often you drink alcohol.  Limit alcohol intake to no more than 1 drink per day for nonpregnant women. One drink equals 12 ounces of beer, 5 ounces of wine, or 1 ounces of hard liquor.  Do not use street drugs.  Do not share needles.  Ask your health care provider for help if you need support or information about  quitting drugs.  Tell your health care provider if you often feel depressed.  Tell your health care provider if you have ever been abused or do not feel safe at home. This information is not intended to replace advice given to you by your health care provider. Make sure you discuss any questions you have with your health care provider. Document Released: 04/25/2011 Document Revised: 03/17/2016 Document Reviewed: 07/14/2015 Elsevier Interactive Patient Education  [employer].

## 2017-11-10 NOTE — Patient Instructions (Signed)
Start taking 4 Units of 70/30 EVERY morning with breakfast and 2 Units of 70/30 EVERY evening with supper---AND continue to add your sliding scale insulin as necessary based on your glucose at that time.

## 2017-11-10 NOTE — Progress Notes (Signed)
OFFICE VISIT  11/10/2017   CC:  Chief Complaint  Patient presents with  . Follow-up    RCI, pt is not fasting.     HPI:    Patient is a 72 y.o. Caucasian female who presents accompanied by her daughter for 3 mo f/u DM 2, HLD, CRI with GFR in 42s.   DM:  Taking humulin 70/30 per sliding scale qAM and qPM  avg fasting 150s-160s, avg 2H PP 260s.  No hypoglycemia. She does not take a base dose of 70/30 at morning and evening--JUST does a sliding scaled.  HLD: takes statin daily, no side effects. She is not working on a healthy diet. She does not exercise due to medical/physical frailty.  CRI: she avoids NSAIDs.  She admits she doesn't focus that well on trying to be well hydrated.  ROS: no CP, no SOB, no fevers, no melena or hematochezia, no LE swelling, no abd pain, no n/v/d.   No rash.  No dizziness, no focal weakness.   Past Medical History:  Diagnosis Date  . Age-related nuclear cataract of both eyes 2016   +cortical age related cataracts OU   . Allergy    seasonal  . Arthritis    hands  . Bilateral diabetic retinopathy (HCC) 2015   Dr. Zigmund Daniel  . Blood transfusion without reported diagnosis    when taking chemo  . Chronic diastolic heart failure (HCC)    Takotsubo CM (resolved _0 /2018):  a. 08/2016 Echo: EF 30-35%, gr1DD, PASP 90mHg;  b. 11/2016 Echo: EF 40-45%, no rwma, nl RV fxn, mild TR, PASP 389mg.  . CKD (chronic kidney disease), stage II    GFR 60s  . 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  . Marland KitchenOPD (chronic obstructive pulmonary disease) ([facility] [place]  . CVA (cerebral vascular accident) ([place]   MRI did show a left-sided small ischemic stroke which was acute but likely incidental (MRI was done b/c pt had TIA sx's in hospital).  Neuro put her on plavix at that time.  Cardiology d/c'd her plavix 08/2017.  . Diabetes mellitus with complication (HCC)    diab retinopathy OU (laser)  . GERD (gastroesophageal reflux  disease)    protonix  . History of diverticulitis of colon    with abscess; required IR percutaneous drain placed 09/2016.  . Marland Kitchenyperlipidemia   . Hypertension   . Left bundle branch block (LBBB) 12/16/2016  . Lung cancer ([facility]   non-small cell lung ca, stage III in 05/2011; systemic chemotherapy concurrent with radiation followed by prophylactic cranial irradiation and has been observation since July of 2010 with no evidence for disease recurrence-released from onc f/u 12/2014 (needs annual cxr by PCP).  CXR 08/2015 stable.  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 MoEarlie Serverid bx and this was NEG for malignancy.  . Mild cognitive impairment with memory loss    Likely from brain radiation therapy  . Non-obstructive CAD (coronary artery disease)    a. Cath 2006 preserved LV fxn, scattered irregularities without critical stenosis; b. 2008 stress echo negative for ischemia, but with hypertensive response; c. 08/2016 Cath: D1 25%, otw nl.  . Normocytic anemia 2018   Iron, vit B12, folate all normal 12/2016.  . Marland Kitchenpen-angle glaucoma 2016   Dr. BeShirley Muscat(bilateral)---responding to topical therapy  . Osteopenia 03/2016   03/2016 DEXA T-score -2.2  . Pneumonia    hospitalization 11/2016; hypoxemic resp failure--d/c'd home with home oxygen therapy  . Subclinical hyperthyroidism 2018  .  Takotsubo cardiomyopathy    a. 08/2016 Echo: EF 30-35%, gr1DD, PASP 1mHg;  b. 08/2016 Cath: nonobs Dzs;  c. 11/2016 Echo: EF 40-45%, no rwma, nl RV fxn, mild TR, PASP 383mg.  ECHO 02/2017 EF 50-55%, grd II DD---essentially resolved Takotsubo 02/2017.  . [place] de pointes ([facility] [place]   a. 08/2016 in setting of diverticulitis & pneumoperitoneum and Takotsubo CM -->prolonged QT, seen by EP-->avoid meds with potential for QT prolongation.    Past Surgical History:  Procedure Laterality Date  . ABDOMINAL HYSTERECTOMY  1997  . APPENDECTOMY    . CARDIAC CATHETERIZATION N/A 09/14/2016   Procedure: Left Heart Cath and Coronary  Angiography;  Surgeon: ThTroy SineMD;  [facility];  Service: Cardiovascular; Mild nonobstructive CAD with 85% smooth narrowing in the first diagonal branch of the LAD; 10% smooth narrowing of the ostial proximal left circumflex coronary artery; and a normal dominant RCA.  . Marland KitchenARPAL TUNNEL RELEASE    . CATARACT EXTRACTION    . CHOLECYSTECTOMY  2002  . COLONOSCOPY  10/24/00   normal.  [employer] hemoccult testing via [place]/25/16 was NEG  . IR GENERIC HISTORICAL  09/19/2016   IR SINUS/FIST TUBE CHK-NON GI 09/19/2016 JaCorrie MckusickDO [facility]  . IR GENERIC HISTORICAL  10/06/2016   IR RADIOLOGIST EVAL & MGMT 10/06/2016 [facility]  . IR GENERIC HISTORICAL  10/26/2016   IR RADIOLOGIST EVAL & MGMT 10/26/2016 [facility]  . IR GENERIC HISTORICAL  11/03/2016   IR RADIOLOGIST EVAL & MGMT 11/03/2016 WeArdis RowanPA-C [facility]  . IR GENERIC HISTORICAL  11/24/2016   IR RADIOLOGIST EVAL & MGMT 11/24/2016 WeArdis RowanPA-C [facility]  . IR GENERIC HISTORICAL  12/05/2016   Fistula smaller/improving.  IR SINUS/FIST TUBE CHK-NON GI 12/05/2016 JoSandi MariscalMD [facility]  . IR GENERIC HISTORICAL  12/22/2016   IR RADIOLOGIST EVAL & MGMT 12/22/2016 MiGreggory KeenMD [facility]  . IR RADIOLOGIST EVAL & MGMT  01/10/2017  . IR RADIOLOGIST EVAL & MGMT  02/09/2017  . IR SINUS/FIST TUBE CHK-NON GI  03/28/2017  . LEFT HEART CATHETERIZATION WITH CORONARY ANGIOGRAM N/A 05/30/2012   Procedure: LEFT HEART CATHETERIZATION WITH CORONARY ANGIOGRAM;  Surgeon: ChBurnell BlanksMD;  [facility];  Service: Cardiovascular: Mild, non-obstructive CAD  . TRANSTHORACIC ECHOCARDIOGRAM  09/14/2016; 11/2016   08/2016: EF 30-35 %, Akinesis of the mid-apicalanteroseptal myocardium.  Grade I DD.  Mild pulm HTN.  Repeat echo 11/2016, EF 40-45%, no rwma, nl RV fxn, mild TR, PASP 3752m.    . TMarland KitchenANSTHORACIC ECHOCARDIOGRAM  03/07/2017   EF 50-55%. Hypokinesis of the distal  septum with overall low normal LV,  systolic function; mild diastolic dysfunction with elevated LV  filling pressure; mildly calcified aortic valve with mild AI; small pericardial effusion    Outpatient Medications Prior to Visit  Medication Sig Dispense Refill  . acetaminophen (TYLENOL) 500 MG tablet Take 500-1,000 mg by mouth every 6 (six) hours as needed for headache (or pain).     . aMarland Kitchenbuterol (PROAIR HFA) 108 (90 Base) MCG/ACT inhaler Inhale 2 puffs into the lungs every 6 (six) hours as needed for wheezing or shortness of breath. 18 g 3  . albuterol (PROVENTIL) (2.5 MG/3ML) 0.083% nebulizer solution USE ONE VIAL IN NEBULIZER EVERY 4 HOURS AS NEEDED FOR WHEEZING OR SHORTNESS OF BREATH 75 mL 3  . aspirin 81 MG chewable tablet Chew 81 mg by mouth daily.    .Marland Kitchen  atorvastatin (LIPITOR) 80 MG tablet Take 1 tablet (80 mg total) by mouth daily. 90 tablet 1  . cetirizine (ZYRTEC) 10 MG tablet Take 10 mg by mouth daily.    Marland Kitchen escitalopram (LEXAPRO) 5 MG tablet Take 1 tablet (5 mg total) by mouth daily. 90 tablet 3  . fluticasone (FLOVENT HFA) 220 MCG/ACT inhaler Inhale 2 puffs into the lungs 2 (two) times daily. 12 g 6  . furosemide (LASIX) 40 MG tablet Take 0.5 tablets (20 mg total) by mouth daily. 30 tablet 6  . glucose blood test strip Use to check blood sugar three times daily 100 each 12  . HUMULIN 70/30 KWIKPEN (70-30) 100 UNIT/ML PEN Per sliding scale, takes this qAM and aPM:  140-199 - 4 units, 200-250 - 6 units, 251-299 - 8 units,  300-349 - 10 units,  350 or above 12 units. Dispense syringes and needles as needed, Ok to switch to PEN if approved. Substitute to any brand approved. DX DM2, Code E11.65 15 mL 6  . HYDROcodone-acetaminophen (NORCO/VICODIN) 5-325 MG tablet Take 2 tablets by mouth every 4 (four) hours as needed. 10 tablet 0  . metoprolol tartrate (LOPRESSOR) 25 MG tablet TAKE 25 MG ( 1 TABLET)  IN THE MORNING , TAKE 50 MG ( 2 TABLETS) AT BEDTIME 270 tablet 3  . nitroGLYCERIN (NITROSTAT)  0.4 MG SL tablet Place 1 tablet (0.4 mg total) under the tongue every 5 (five) minutes as needed. For chest pain 50 tablet 3  . ONE TOUCH ULTRA TEST test strip USE  STRIP TO CHECK GLUCOSE THREE TIMES DAILY 100 each 11  . ONETOUCH DELICA LANCETS 45G MISC TEST 3 TIMES A DAY 100 each 11  . pantoprazole (PROTONIX) 40 MG tablet Take 1 tablet (40 mg total) by mouth daily. 90 tablet 3  . potassium chloride (K-DUR) 10 MEQ tablet TAKE 1 TABLET BY MOUTH ONCE DAILY 90 tablet 1  . albuterol (PROVENTIL) (2.5 MG/3ML) 0.083% nebulizer solution USE 1 VIAL IN NEBULIZER EVERY 4 HOURS AS NEEDED FOR WHEEZING OR SHORTNESS OF BREATH (Patient not taking: Reported on 11/10/2017) 75 mL 3   No facility-administered medications prior to visit.     Allergies  Allergen Reactions  . Lactose Intolerance (Gi) Diarrhea  . Ibuprofen Other (See Comments)    REACTION: Anxiousness, hyperventilates    ROS As per HPI  PE: Blood pressure 127/67, pulse 84, temperature (!) 97.5 F (36.4 C), temperature source Oral, resp. rate 16, height _0  (1.575 m), weight 157 lb 8 oz (71.4 kg), SpO2 95 %. Gen: Alert, well appearing.  Patient is oriented to person, place, time, and situation. AFFECT: pleasant, lucid thought and speech. CV: RRR, no m/r/g.   LUNGS: CTA bilat, nonlabored resps, good aeration in all lung fields. EXT: no clubbing, cyanosis, or edema.    LABS:  Lab Results  Component Value Date   TSH 1.24 04/20/2017   Lab Results  Component Value Date   WBC 7.2 09/12/2017   HGB 11.7 (L) 09/12/2017   HCT 36.6 09/12/2017   MCV 86.9 09/12/2017   PLT 184 09/12/2017   Lab Results  Component Value Date   CREATININE 0.92 08/10/2017   BUN 13 08/10/2017   NA 139 08/10/2017   K 4.4 08/10/2017   CL 101 08/10/2017   CO2 32 08/10/2017   Lab Results  Component Value Date   ALT 10 04/20/2017   AST 14 04/20/2017   ALKPHOS 99 04/20/2017   BILITOT 0.3 04/20/2017   Lab Results  Component Value Date   CHOL 65 12/02/2016    Lab Results  Component Value Date   HDL 34 (L) 12/02/2016   Lab Results  Component Value Date   LDLCALC 23 12/02/2016   Lab Results  Component Value Date   TRIG 42 12/02/2016   Lab Results  Component Value Date   CHOLHDL 1.9 12/02/2016   Lab Results  Component Value Date   HGBA1C 6.7 (H) 08/10/2017    IMPRESSION AND PLAN:  1) DM 2, with diab retinopathy, on long term insulin therapy. Control not very good as per review of her home glucose monitoring. I recommended she start giving a base dose of 70/30 at BF (4 units) and supper (2 Units), and also add appropriate # of units to this as per her current sliding scale (anywhere from 2-10 units). HbA1c today.   All other routine diabetic monitoring is UTD.  2) HLD: needs to improve diet, get a bit more active. Tolerating statin.  Not fasting today.  LDL 1 year ago was 23.  3) CRI stage II (GFR in 60s): BMET today. Encouraged more aggressive hydration.  Continue to avoid NSAIDs.  An After Visit Summary was printed and given to the patient.  FOLLOW UP: Return in about 3 months (around 02/08/2018) for routine chronic illness f/u.  Signed:  Crissie Sickles, MD           11/10/2017

## 2017-11-11 ENCOUNTER — Other Ambulatory Visit: Payer: Self-pay | Admitting: Family Medicine

## 2017-11-11 DIAGNOSIS — E782 Mixed hyperlipidemia: Secondary | ICD-10-CM

## 2017-11-11 DIAGNOSIS — E785 Hyperlipidemia, unspecified: Secondary | ICD-10-CM

## 2017-11-11 NOTE — Progress Notes (Signed)
AWV reviewed and agree.  Signed:  Crissie Sickles, MD           11/11/2017

## 2017-11-13 ENCOUNTER — Encounter: Payer: Self-pay | Admitting: Family Medicine

## 2017-11-13 IMAGING — RF DG SINUS / FISTULA TRACT / ABSCESSOGRAM
3 series · 6 of 6 positions shown · non-contrast
Comparison: none

INDICATION: Follow-up drain

[Series 1: one shot · 1 of 1 slices shown (1 of 2)]
[im 1/1]
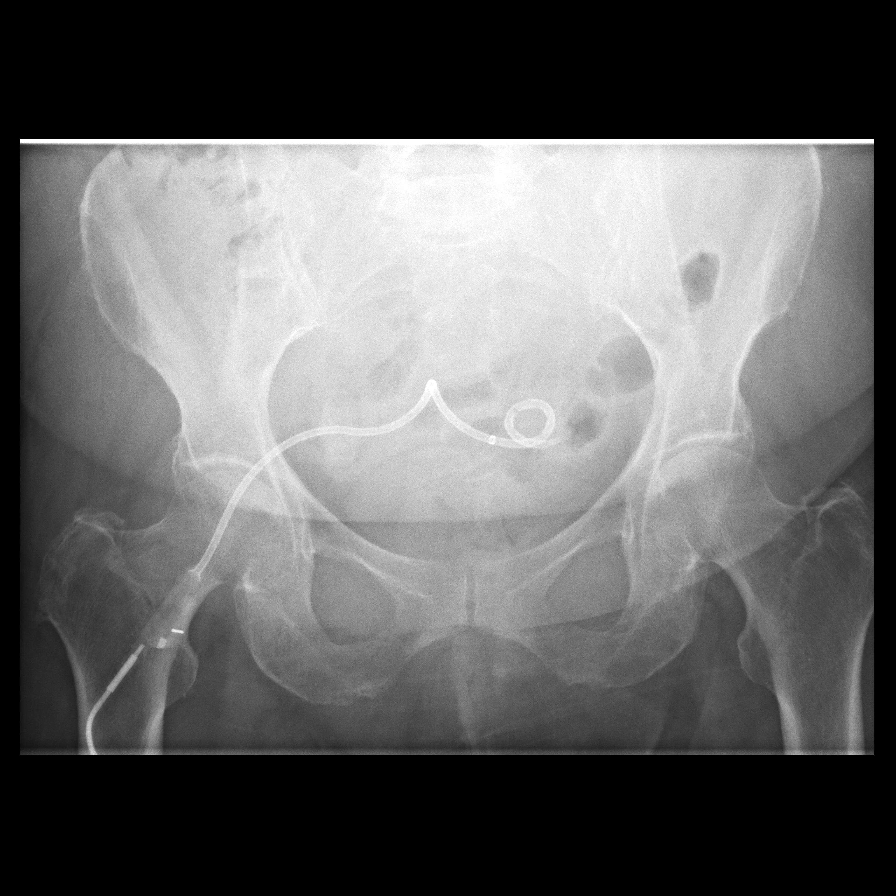

[Series 2: sequence · 4 of 47 frames shown]
[frame 2/47]
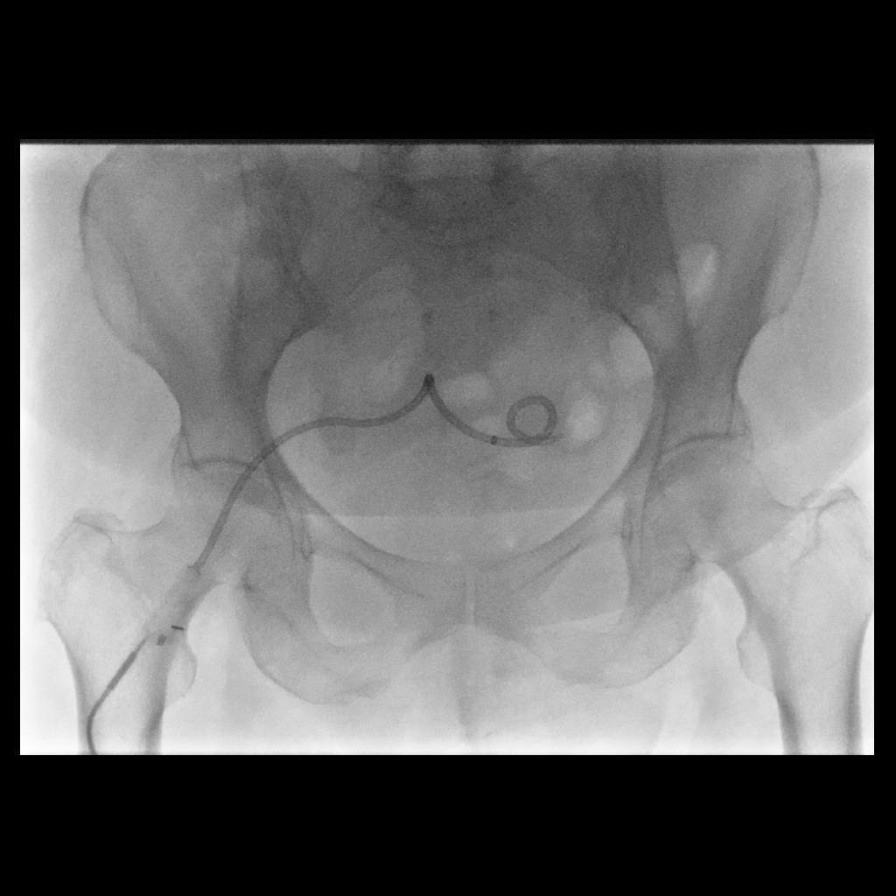
[frame 8/47]
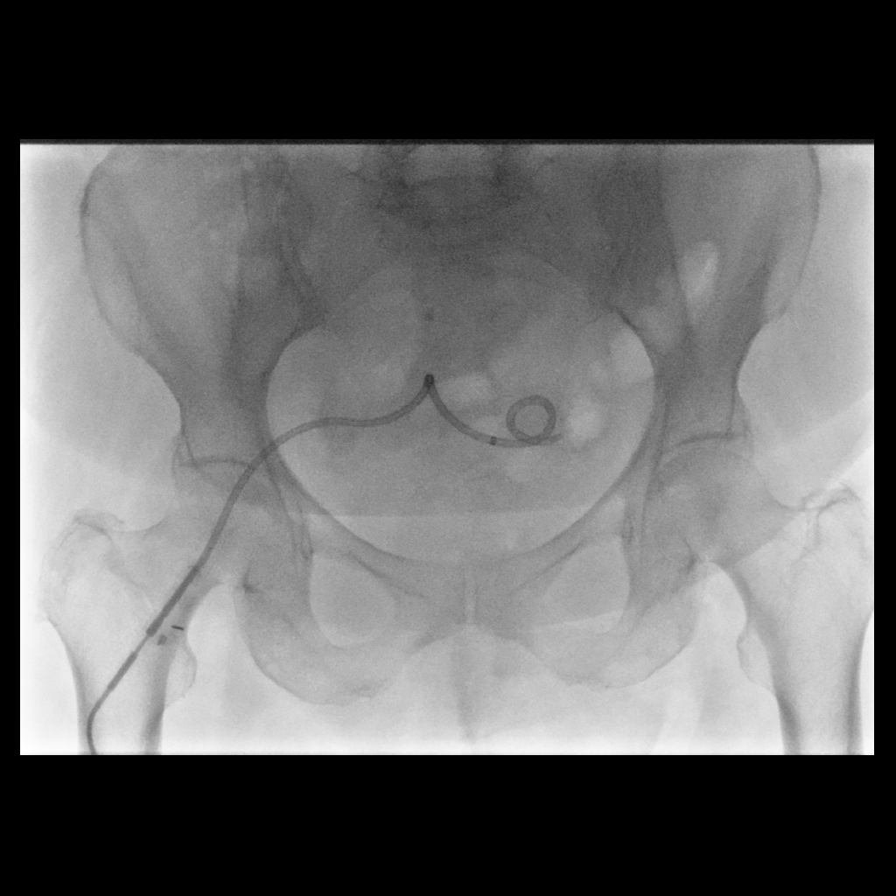
[frame 24/47]
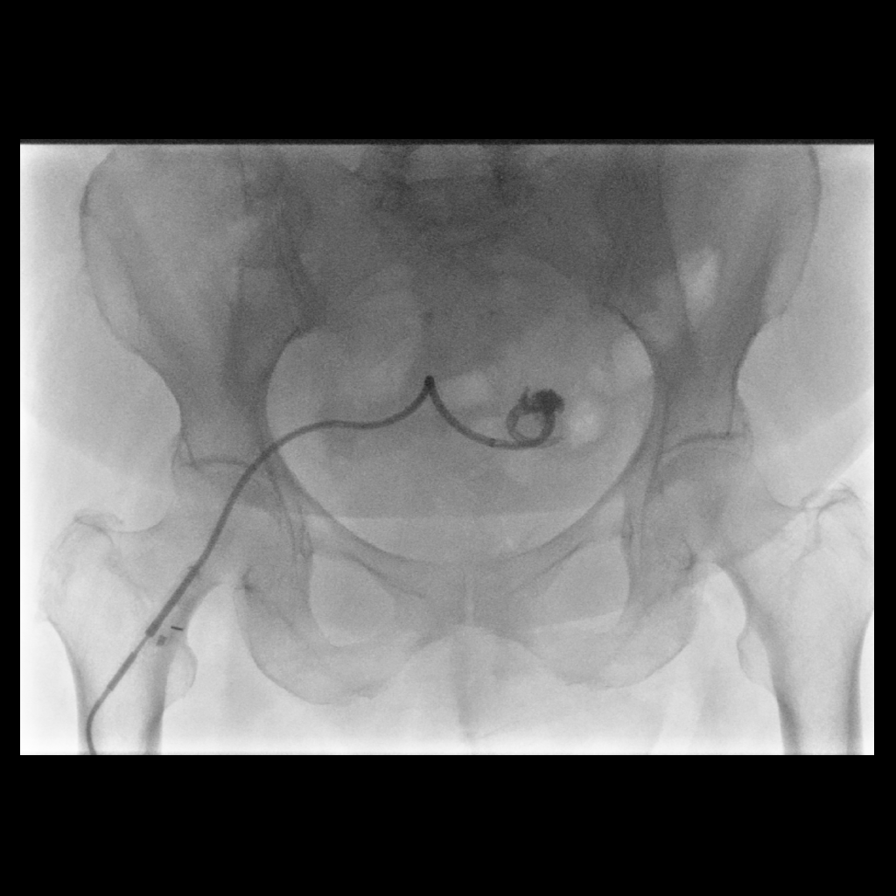
[frame 40/47]
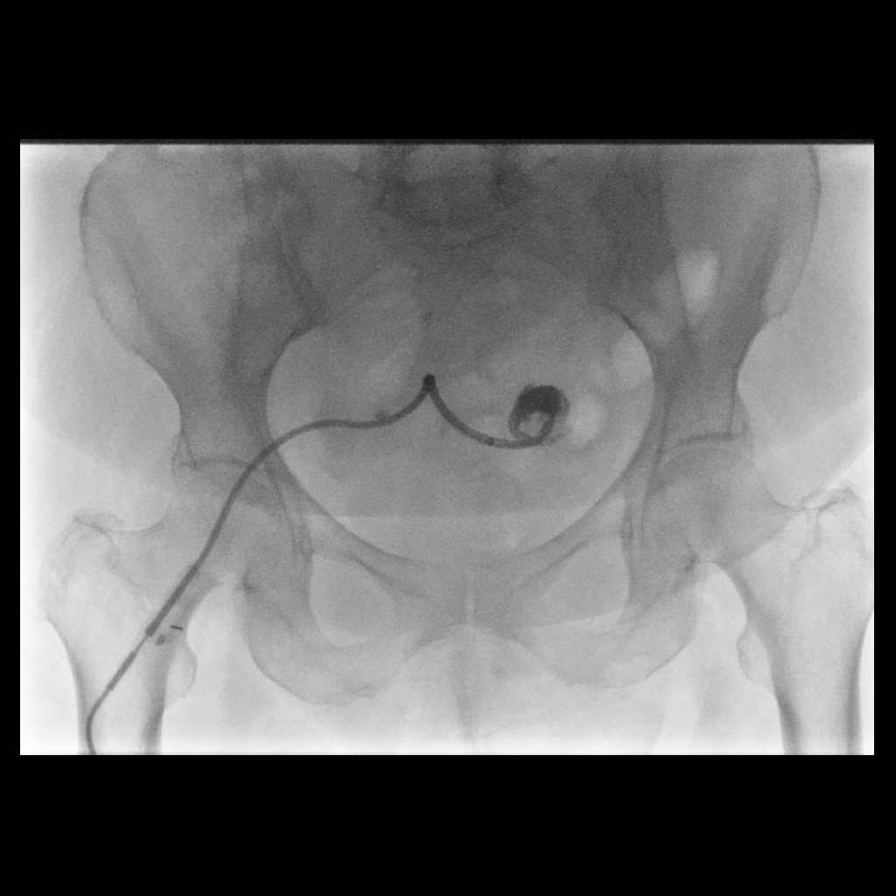

[Series 3: one shot · 1 of 1 slices shown (2 of 2)]
[im 1/1]
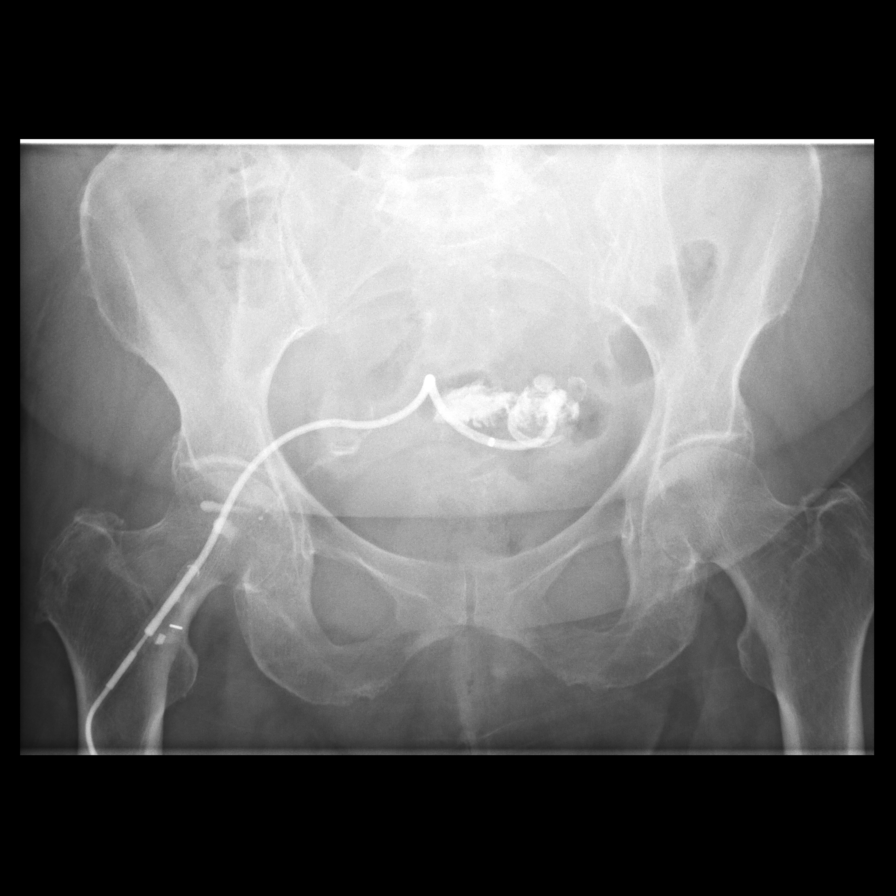

[6 of 6 positions shown; findings below may reference images not displayed]

EXAM:
PELVIC ABSCESS STRAIN INJECTION

MEDICATIONS:
The patient is currently admitted to the hospital and receiving
intravenous antibiotics. The antibiotics were administered within an
appropriate time frame prior to the initiation of the procedure.

ANESTHESIA/SEDATION:
None.

COMPLICATIONS:
None

PROCEDURE:
Contrast was injected into the abscess strain and imaging was
obtained.
FINDINGS: The abscess cavity remains decompressed. The fistula to the adjacent
sigmoid colon is stable.
IMPRESSION: Stable fistula to sigmoid colon. Abscess cavity remains
decompressed. Follow-up in 1 month.

## 2017-11-14 ENCOUNTER — Telehealth: Payer: Self-pay | Admitting: Family Medicine

## 2017-11-14 NOTE — Telephone Encounter (Signed)
Phone call from pt's husband.  He requested to review the base dose of insulin and the sliding scale doses.  Advised that the base dose of 70/30 Insulin @ breakfast is 4 units, and the base dose of 70/30 Insulin @ supper is 2 units.  Reviewed the sliding scale blood sugar range with additional insulin doses she will need, based on her blood sugar.  Husband read back the sliding scale and verbalized understanding.

## 2017-12-13 IMAGING — RF DG SINUS / FISTULA TRACT / ABSCESSOGRAM
3 series · 7 of 7 positions shown · non-contrast
Comparison: CT-guided percutaneous drainage catheter placement -
09/12/2016;

CLINICAL DATA: History of diverticular abscess, post CT-guided
percutaneous drainage catheter placement on 09/12/2016

EXAM:
ABSCESS INJECTION
TECHNIQUE: The patient was positioned supine on the fluoroscopy table.

[Series 1: one shot · 1 of 1 slices shown (1 of 2)]
[im 1/1]
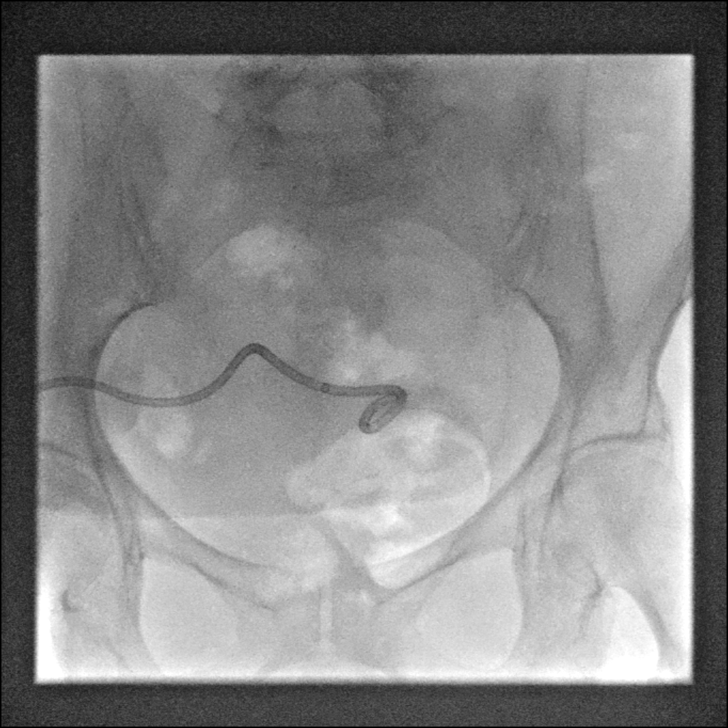

[Series 2: sequence · 4 of 72 frames shown]
[frame 2/72]
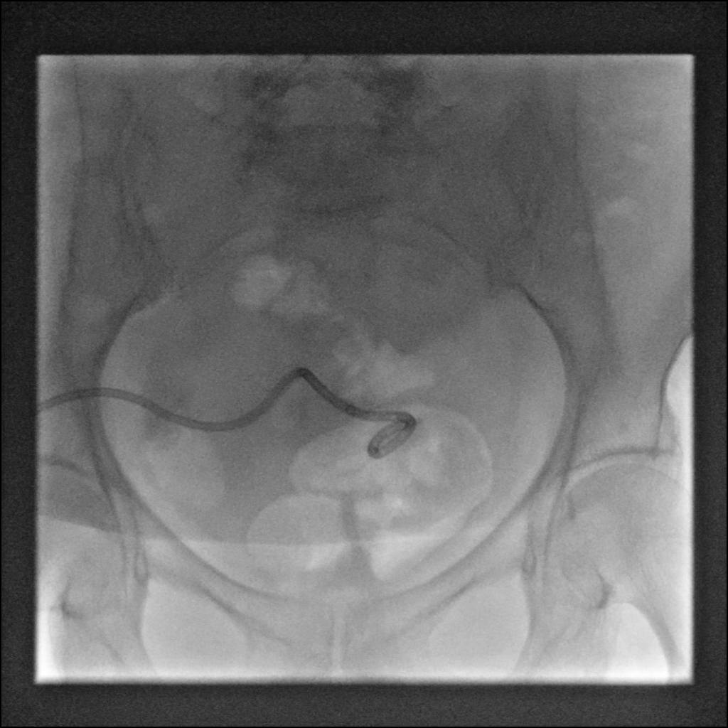
[frame 11/72]
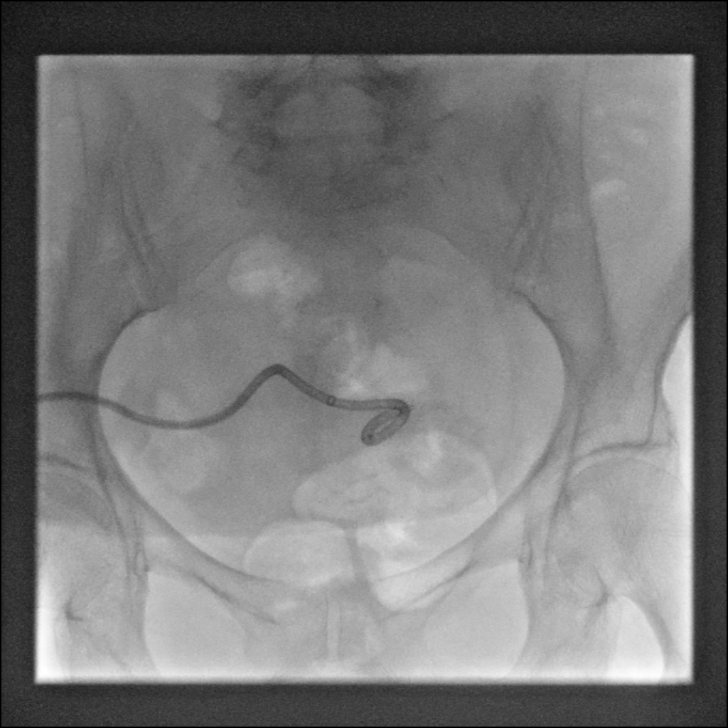
[frame 37/72]
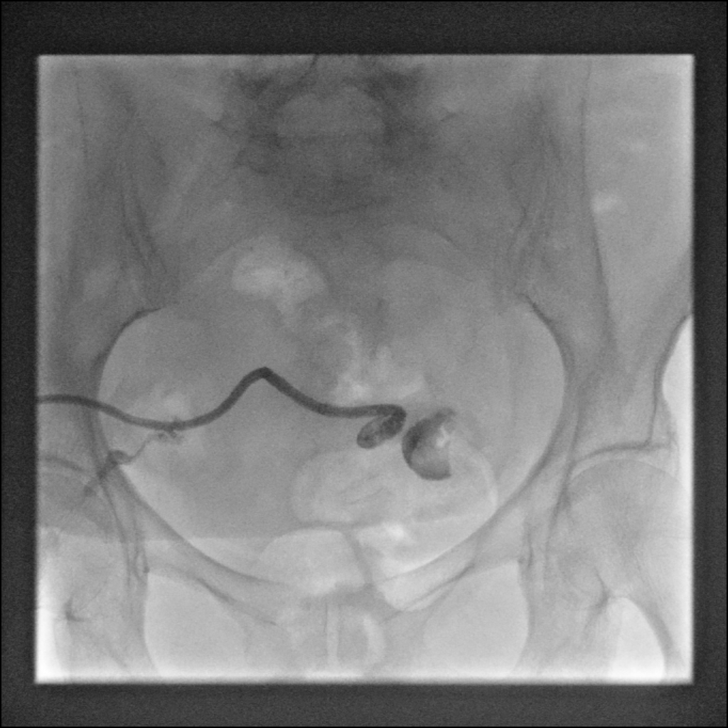
[frame 62/72]
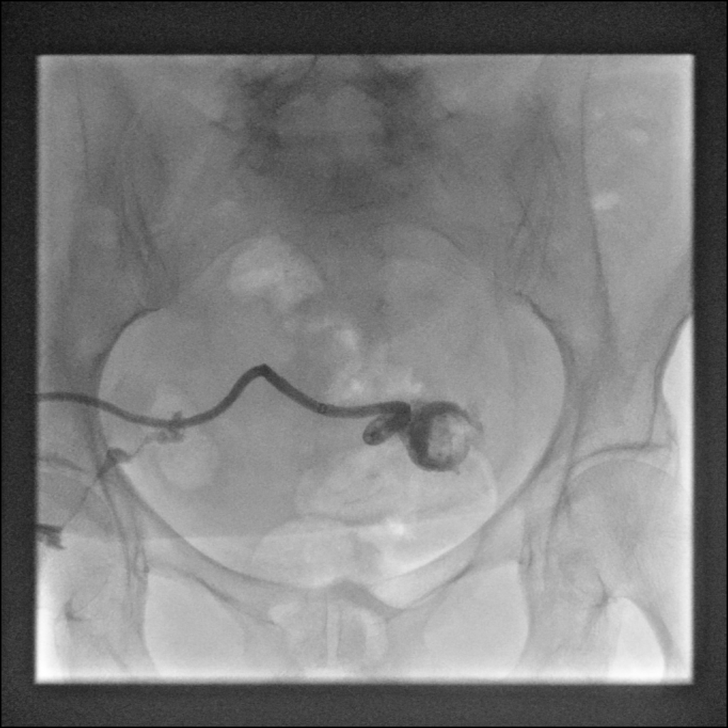

[Series 3: one shot · 2 of 2 slices shown (2 of 2)]
[im 1/2]
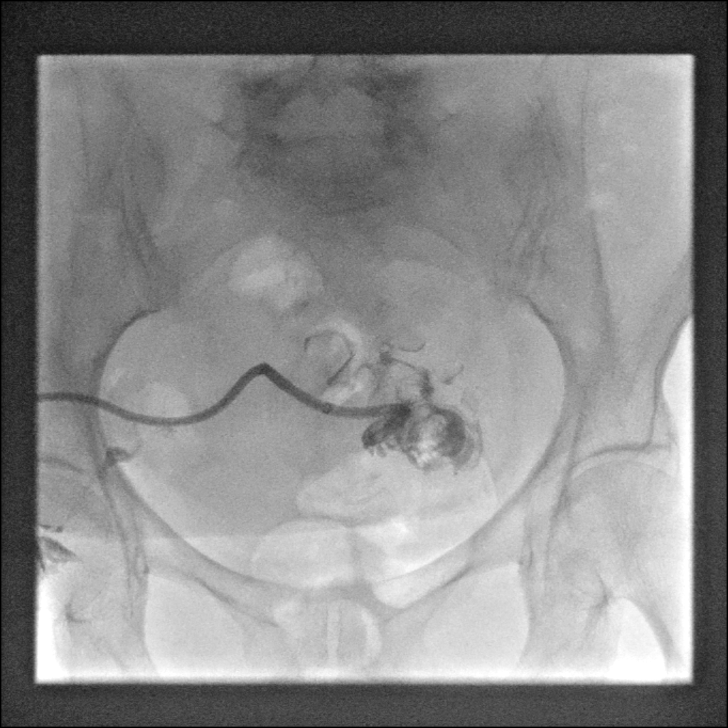
[im 2/2]
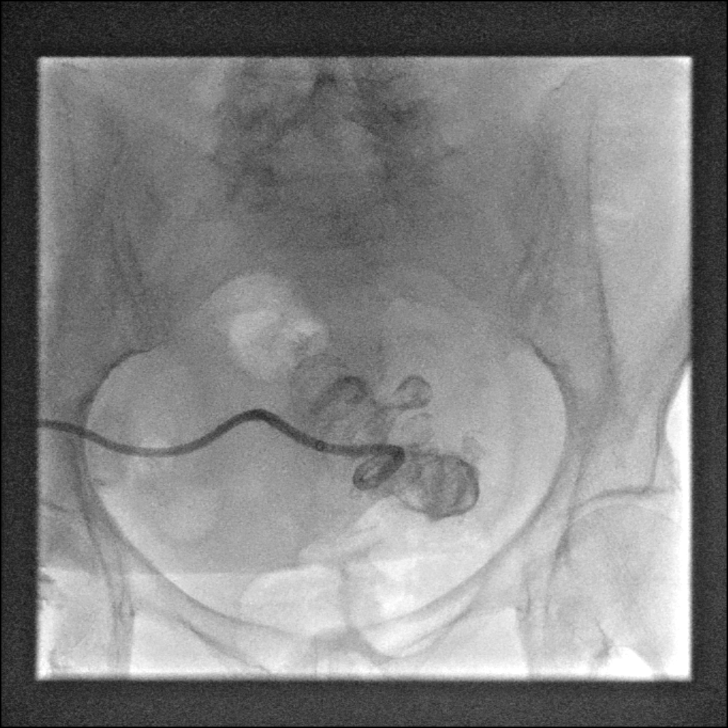

[7 of 7 positions shown; findings below may reference images not displayed]

CT abdomen and pelvis- 09/11/2016; 09/16/2016;
fluoroscopic guided drainage catheter injection - 01/10/2017;
12/22/2016; 11/03/2016; 10/26/2016; 10/06/2016

CONTRAST:  5 cc Omnipaque 300

FLUOROSCOPY TIME:  12 seconds (18 mGy)
A preprocedural spot fluoroscopic image was obtained of the lower
pelvis and existing percutaneous drainage catheter

Multiple spot fluoroscopic and radiographic images were obtained
following the injection of a small amount of contrast via the
existing percutaneous drainage catheter.

Images reviewed in the procedure was terminated. The drainage
catheter was flushed with a small amount of saline and reconnected
to the gravity bag. A dressing was placed. The patient tolerated the
procedure well without immediate postprocedural complication.
FINDINGS: Preprocedural spot fluoroscopic image demonstrates unchanged
positioning of the percutaneous drainage catheter with and coiled
and locked over the midline of the pelvis.

Contrast injection demonstrates brisk communication with the
decompressed abscess cavity and the adjacent sigmoid colon as has
been demonstrated on multiple previous fluoroscopic guided drainage
catheter injections dating back to 10/06/2016.
IMPRESSION: Persistent fistulous connection between decompressed abscess cavity
in the adjacent sigmoid colon, similar to all previous drainage
catheter injections dating back to 10/06/2016.

## 2018-01-29 IMAGING — XA IR FISTULA/SINUS TRACT
3 series · 8 of 8 positions shown · non-contrast
Comparison: Most recent prior drain injection [DATE].

MEDICATIONS:
None.

ANESTHESIA/SEDATION:
None.

COMPLICATIONS:
None immediate.

INDICATION: 70-year-old female with a history of complicated diverticulitis with
diverticular abscess. A percutaneous drainage catheter was placed in
Wednesday August, 2016. Drainage catheter placement was prolonged due to a
persistent fistulous communication with the adjacent sigmoid colon.
The patient's last drain evaluation was on February 09, 2017 at which
time she was referred to surgery for definitive surgical correction
given failure of 5 months conservative therapy at that time. The
patient remains very high risk for surgery and the surgical plan was
for indefinite percutaneous drainage.
TECHNIQUE: Informed written consent was obtained from the patient after a
thorough discussion of the procedural risks, benefits and
alternatives. All questions were addressed. Maximal Sterile Barrier
Technique was utilized including caps, mask, sterile gowns, sterile
gloves, sterile drape, hand hygiene and skin antiseptic. A timeout
was performed prior to the initiation of the procedure.

[Series 1: fl (-) angio · 4 of 17 frames shown]
[frame 3/17]
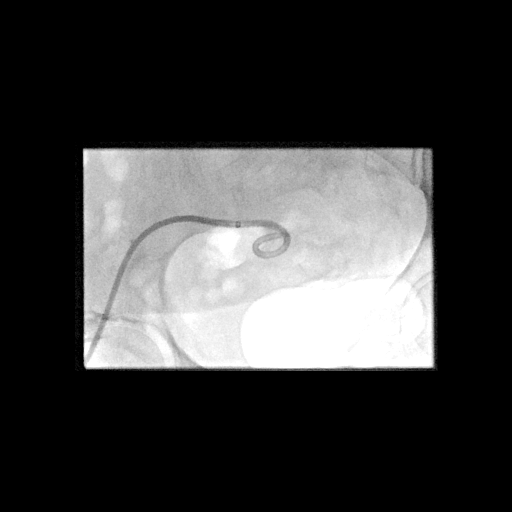
[frame 9/17]
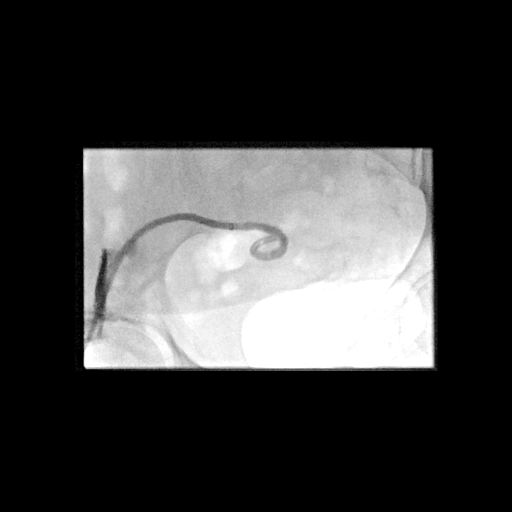
[frame 15/17]
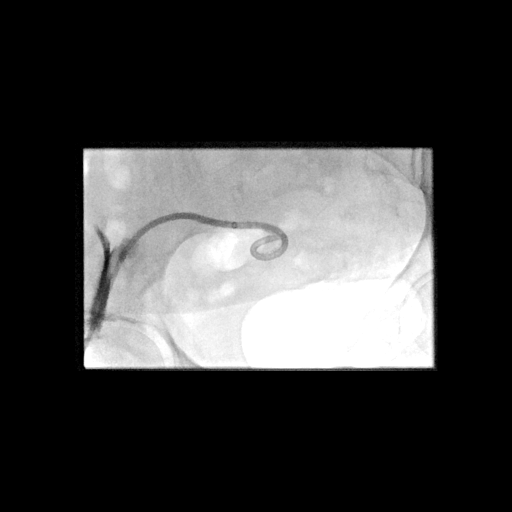
[frame 17/17]
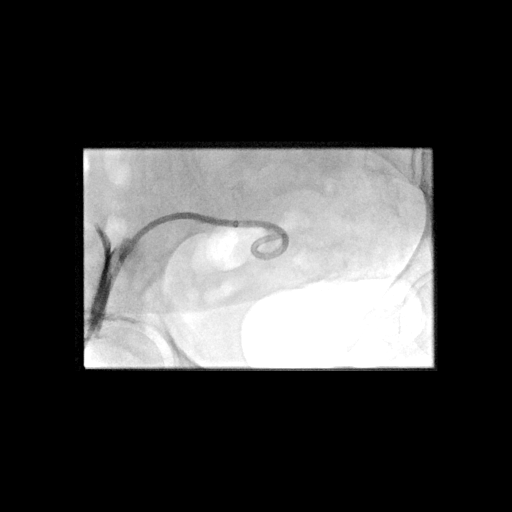

[Series 2: body 4 care · 3 of 3 frames shown]
[frame 1/3]
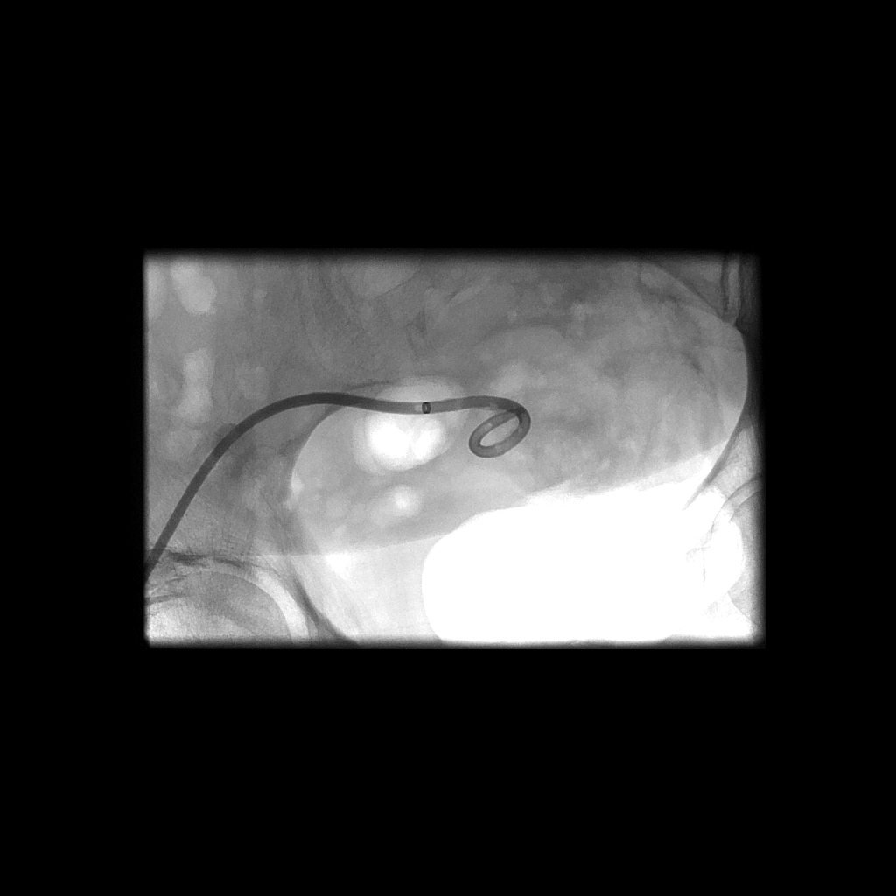
[frame 2/3]
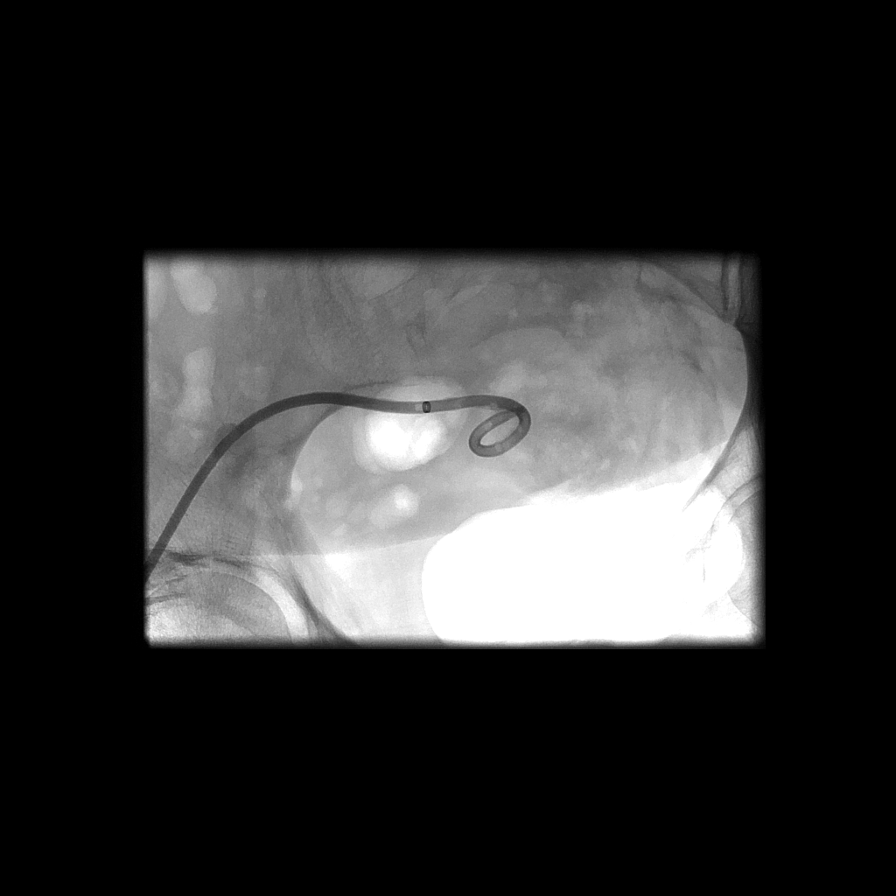
[frame 3/3]
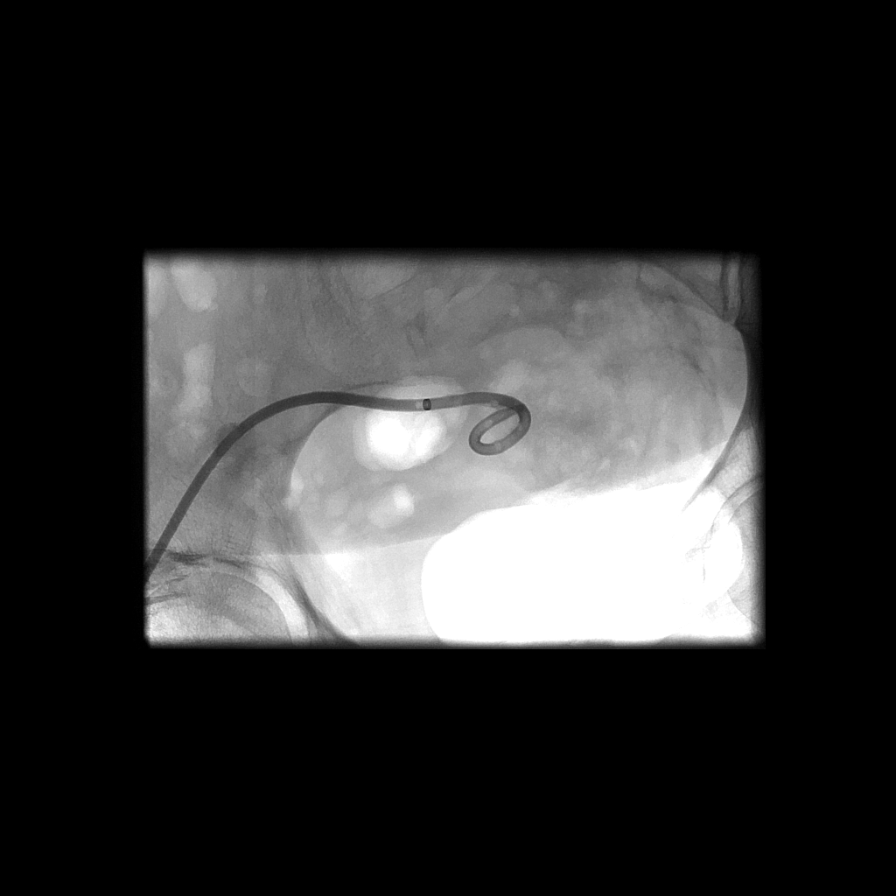

[Series 3: single · 1 of 1 slices shown]
[im 1/1]
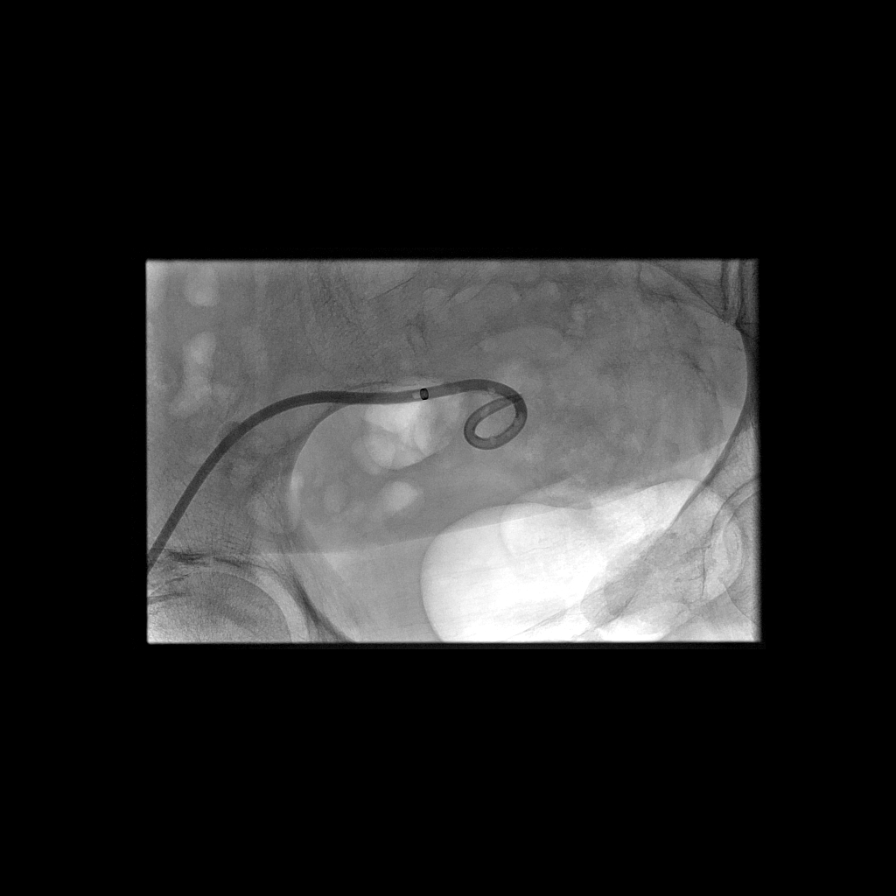

[8 of 8 positions shown; findings below may reference images not displayed]

The patient called the [REDACTED] earlier today
reporting that the external portion of the tube have become
fractured. She presents today for tube evaluation and possible
exchange. She has had essentially 0 output over the past few weeks.
She last changed her drainage bag this past [REDACTED] [REDACTED]. The
external portion of the tube is fractured and currently repaired
with duct tape.

EXAM:
SINUS TRACT INJECTION / FISTULOGRAM
An attempted contrast injection was performed through the existing
drainage tube. The tube opacifies partially but is clearly
obstructed distally.

Therefore, the tube was cut and removed over a stiff Glidewire. BASTIAN ALONSO
Klever Jumper 10.2 French all-purpose drainage catheter was advanced over
the Glidewire and formed. A repeat contrast injection was performed
through the new tube. The tube opacifies completely, however the
abscess cavity is completely decompressed any injected contrast
material tracks back along the tube tract and out the skin surface.
Despite multiple forceful injections, no fistulous communication
could be demonstrated between the drainage catheter and the adjacent
sigmoid colon. The fistula appears to have healed in the interim
since 02/09/2017.

PROCEDURE:
A total of ml of 15 mL Isovue 300 were slowly injected. Spot film
images were obtained.
IMPRESSION: 1. Interval healing of the fistulous communication between the
drainage catheter in the adjacent sigmoid colon.
2. The drainage catheter was removed.

## 2018-02-04 ENCOUNTER — Other Ambulatory Visit: Payer: Self-pay | Admitting: Family Medicine

## 2018-02-05 DIAGNOSIS — Z0279 Encounter for issue of other medical certificate: Secondary | ICD-10-CM

## 2018-02-05 NOTE — Telephone Encounter (Signed)
[facility]  RF request for potassium LOV: 11/10/17 Next ov: 02/08/18 Last written: 07/24/17 #90 w/ 1RF  RF request for furosemide  Last written: 01/26/17 #30 w/ 6RF  Please advise. Thanks.

## 2018-02-08 ENCOUNTER — Encounter: Payer: Self-pay | Admitting: Family Medicine

## 2018-02-08 ENCOUNTER — Ambulatory Visit: Payer: Medicare Other | Admitting: Family Medicine

## 2018-02-08 VITALS — BP 107/70 | HR 83 | Temp 97.3°F | Ht 62.0 in | Wt 158.2 lb

## 2018-02-08 DIAGNOSIS — I10 Essential (primary) hypertension: Secondary | ICD-10-CM

## 2018-02-08 DIAGNOSIS — E7800 Pure hypercholesterolemia, unspecified: Secondary | ICD-10-CM | POA: Diagnosis not present

## 2018-02-08 DIAGNOSIS — N182 Chronic kidney disease, stage 2 (mild): Secondary | ICD-10-CM | POA: Diagnosis not present

## 2018-02-08 DIAGNOSIS — Z794 Long term (current) use of insulin: Secondary | ICD-10-CM

## 2018-02-08 DIAGNOSIS — E118 Type 2 diabetes mellitus with unspecified complications: Secondary | ICD-10-CM

## 2018-02-08 LAB — BASIC METABOLIC PANEL
BUN: 13 mg/dL (ref 6–23)
CALCIUM: 8.8 mg/dL (ref 8.4–10.5)
CO2: 28 mEq/L (ref 19–32)
CREATININE: 1.14 mg/dL (ref 0.40–1.20)
Chloride: 101 mEq/L (ref 96–112)
GFR: 50.14 mL/min — AB (ref >60.00)
GLUCOSE: 212 mg/dL — AB (ref 70–99)
Potassium: 4.3 mEq/L (ref 3.5–5.1)
SODIUM: 138 meq/L (ref 135–145)

## 2018-02-08 LAB — HEMOGLOBIN A1C: Hgb A1c MFr Bld: 8.3 % — ABNORMAL HIGH (ref 4.6–6.5)

## 2018-02-08 MED ORDER — FUROSEMIDE 20 MG PO TABS: 20.0000 mg | ORAL_TABLET | Freq: Every day | ORAL | 1 refills | Status: DC

## 2018-02-08 NOTE — Progress Notes (Signed)
OFFICE VISIT  02/08/2018   CC:  Chief Complaint  Patient presents with  . Follow-up    RCI    HPI:    Patient is a 72 y.o. Caucasian female who presents accompanied by her daughter for 3 mo f/u DM 2, HLD, CRI with GFR 60s. She has grd II DD.  Takes lasix qd scheduled, no prn doses.  DM: HbA1c 8.1% last o/v so I recommended she start giving a base dose of 70/30 at BF (4 units) and supper (2 Units), and also add appropriate # of units to this as per her current sliding scale (anywhere from 2-10 units). Avg fasting gluc in last couple months is 170, avg 2H PP 270.  HLD: taking statin daily.  No complaints. Not limiting anything in her diet.  Reviewed Dr. Allison Quarry note from 08/2017 and he commented that her atorva dose could be decreased to 40 or 20 mg qd.  CRI: avoids NSAIDs.  Trying to be better about fluid intake.  ROS: no melena or hematochezia, no focal weakness, no SOB, no acute vision changes, no feet lesions, no myalgias/arthralgias.  Past Medical History:  Diagnosis Date  . Age-related nuclear cataract of both eyes 2016   +cortical age related cataracts OU   . Allergy    seasonal  . Arthritis    hands  . Bilateral diabetic retinopathy (HCC) 2015   Dr. Zigmund Daniel  . Blood transfusion without reported diagnosis    when taking chemo  . Chronic diastolic heart failure (HCC)    Takotsubo CM (resolved _0 /2018):  a. 08/2016 Echo: EF 30-35%, gr1DD, PASP 57mHg;  b. 11/2016 Echo: EF 40-45%, no rwma, nl RV fxn, mild TR, PASP 376mg.  . CKD (chronic kidney disease), stage II    GFR 60s  . 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  . Marland KitchenOPD (chronic obstructive pulmonary disease) (HCPelion  . CVA (cerebral vascular accident) (HCDenver12/2017   MRI did show a left-sided small ischemic stroke which was acute but likely incidental (MRI was done b/c pt had TIA sx's in hospital).  Neuro put her on plavix at that time.  Cardiology d/c'd her plavix 08/2017.   . Diabetes mellitus with complication (HCC)    diab retinopathy OU (laser)  . GERD (gastroesophageal reflux disease)    protonix  . History of diverticulitis of colon    with abscess; required IR percutaneous drain placed 09/2016.  . Marland Kitchenyperlipidemia   . Hypertension   . Left bundle branch block (LBBB) 12/16/2016  . Lung cancer (HCBoyd   non-small cell lung ca, stage III in 05/2011; systemic chemotherapy concurrent with radiation followed by prophylactic cranial irradiation and has been observation since July of 2010 with no evidence for disease recurrence-released from onc f/u 12/2014 (needs annual cxr by PCP).  CXR 08/2015 stable.  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 MoEarlie Serverid bx and this was NEG for malignancy.  . Mild cognitive impairment with memory loss    Likely from brain radiation therapy  . Non-obstructive CAD (coronary artery disease)    a. Cath 2006 preserved LV fxn, scattered irregularities without critical stenosis; b. 2008 stress echo negative for ischemia, but with hypertensive response; c. 08/2016 Cath: D1 25%, otw nl.  . Normocytic anemia 2018   Iron, vit B12, folate all normal 12/2016.  . Marland Kitchenpen-angle glaucoma 2016   Dr. BeShirley Muscat(bilateral)---responding to topical therapy  . Osteopenia 03/2016   03/2016 DEXA T-score -2.2  . Pneumonia  hospitalization 11/2016; hypoxemic resp failure--d/c'd home with home oxygen therapy  . Subclinical hyperthyroidism 2018  . Takotsubo cardiomyopathy    a. 08/2016 Echo: EF 30-35%, gr1DD, PASP 1mHg;  b. 08/2016 Cath: nonobs Dzs;  c. 11/2016 Echo: EF 40-45%, no rwma, nl RV fxn, mild TR, PASP 345mg.  ECHO 02/2017 EF 50-55%, grd II DD---essentially resolved Takotsubo 02/2017.  . Torsades de pointes (HCOakwood11/2017   a. 08/2016 in setting of diverticulitis & pneumoperitoneum and Takotsubo CM -->prolonged QT, seen by EP-->avoid meds with potential for QT prolongation.    Past Surgical History:  Procedure Laterality Date  . ABDOMINAL HYSTERECTOMY   1997  . APPENDECTOMY    . CARDIAC CATHETERIZATION N/A 09/14/2016   Procedure: Left Heart Cath and Coronary Angiography;  Surgeon: ThTroy SineMD;  [facility];  Service: Cardiovascular; Mild nonobstructive CAD with 85% smooth narrowing in the first diagonal branch of the LAD; 10% smooth narrowing of the ostial proximal left circumflex coronary artery; and a normal dominant RCA.  . Marland KitchenARPAL TUNNEL RELEASE    . CATARACT EXTRACTION    . CHOLECYSTECTOMY  2002  . COLONOSCOPY  10/24/00   normal.  [employer] hemoccult testing via [place]/25/16 was NEG  . IR GENERIC HISTORICAL  09/19/2016   IR SINUS/FIST TUBE CHK-NON GI 09/19/2016 JaCorrie MckusickDO [facility]  . IR GENERIC HISTORICAL  10/06/2016   IR RADIOLOGIST EVAL & MGMT 10/06/2016 [facility]  . IR GENERIC HISTORICAL  10/26/2016   IR RADIOLOGIST EVAL & MGMT 10/26/2016 [facility]  . IR GENERIC HISTORICAL  11/03/2016   IR RADIOLOGIST EVAL & MGMT 11/03/2016 WeArdis RowanPA-C [facility]  . IR GENERIC HISTORICAL  11/24/2016   IR RADIOLOGIST EVAL & MGMT 11/24/2016 WeArdis RowanPA-C [facility]  . IR GENERIC HISTORICAL  12/05/2016   Fistula smaller/improving.  IR SINUS/FIST TUBE CHK-NON GI 12/05/2016 JoSandi MariscalMD [facility]  . IR GENERIC HISTORICAL  12/22/2016   IR RADIOLOGIST EVAL & MGMT 12/22/2016 MiGreggory KeenMD [facility]  . IR RADIOLOGIST EVAL & MGMT  01/10/2017  . IR RADIOLOGIST EVAL & MGMT  02/09/2017  . IR SINUS/FIST TUBE CHK-NON GI  03/28/2017  . LEFT HEART CATHETERIZATION WITH CORONARY ANGIOGRAM N/A 05/30/2012   Procedure: LEFT HEART CATHETERIZATION WITH CORONARY ANGIOGRAM;  Surgeon: ChBurnell BlanksMD;  [facility];  Service: Cardiovascular: Mild, non-obstructive CAD  . TRANSTHORACIC ECHOCARDIOGRAM  09/14/2016; 11/2016   08/2016: EF 30-35 %, Akinesis of the mid-apicalanteroseptal myocardium.  Grade I DD.  Mild pulm HTN.  Repeat echo 11/2016, EF 40-45%, no rwma, nl RV  fxn, mild TR, PASP 3749m.    . TMarland KitchenANSTHORACIC ECHOCARDIOGRAM  03/07/2017   EF 50-55%. Hypokinesis of the distal septum with overall low normal LV,  systolic function; mild diastolic dysfunction with elevated LV  filling pressure; mildly calcified aortic valve with mild AI; small pericardial effusion    Outpatient Medications Prior to Visit  Medication Sig Dispense Refill  . acetaminophen (TYLENOL) 500 MG tablet Take 500-1,000 mg by mouth every 6 (six) hours as needed for headache (or pain).     . aMarland Kitchenbuterol (PROAIR HFA) 108 (90 Base) MCG/ACT inhaler Inhale 2 puffs into the lungs every 6 (six) hours as needed for wheezing or shortness of breath. 18 g 3  . albuterol (PROVENTIL) (2.5 MG/3ML) 0.083% nebulizer solution USE ONE VIAL IN NEBULIZER EVERY 4 HOURS AS NEEDED FOR WHEEZING OR SHORTNESS OF BREATH 75  mL 3  . aspirin 81 MG chewable tablet Chew 81 mg by mouth daily.    Marland Kitchen atorvastatin (LIPITOR) 80 MG tablet TAKE 1 TABLET BY MOUTH ONCE DAILY 90 tablet 1  . cetirizine (ZYRTEC) 10 MG tablet Take 10 mg by mouth daily.    Marland Kitchen escitalopram (LEXAPRO) 5 MG tablet Take 1 tablet (5 mg total) by mouth daily. 90 tablet 3  . fluticasone (FLOVENT HFA) 220 MCG/ACT inhaler Inhale 2 puffs into the lungs 2 (two) times daily. 12 g 6  . glucose blood test strip Use to check blood sugar three times daily 100 each 12  . HUMULIN 70/30 KWIKPEN (70-30) 100 UNIT/ML PEN Per sliding scale, takes this qAM and aPM:  140-199 - 4 units, 200-250 - 6 units, 251-299 - 8 units,  300-349 - 10 units,  350 or above 12 units. Dispense syringes and needles as needed, Ok to switch to PEN if approved. Substitute to any brand approved. DX DM2, Code E11.65 15 mL 6  . metoprolol tartrate (LOPRESSOR) 25 MG tablet TAKE 25 MG ( 1 TABLET)  IN THE MORNING , TAKE 50 MG ( 2 TABLETS) AT BEDTIME 270 tablet 3  . nitroGLYCERIN (NITROSTAT) 0.4 MG SL tablet Place 1 tablet (0.4 mg total) under the tongue every 5 (five) minutes as needed. For chest pain 50 tablet  3  . ONE TOUCH ULTRA TEST test strip USE  STRIP TO CHECK GLUCOSE THREE TIMES DAILY 100 each 11  . ONETOUCH DELICA LANCETS 16X MISC TEST 3 TIMES A DAY 100 each 11  . pantoprazole (PROTONIX) 40 MG tablet Take 1 tablet (40 mg total) by mouth daily. 90 tablet 3  . potassium chloride (K-DUR) 10 MEQ tablet TAKE 1 TABLET BY MOUTH ONCE DAILY 90 tablet 1  . furosemide (LASIX) 40 MG tablet Take 0.5 tablets (20 mg total) by mouth daily. 30 tablet 6  . furosemide (LASIX) 40 MG tablet Take 0.5 tablets (20 mg total) by mouth daily. 45 tablet 1  . HYDROcodone-acetaminophen (NORCO/VICODIN) 5-325 MG tablet Take 2 tablets by mouth every 4 (four) hours as needed. 10 tablet 0   No facility-administered medications prior to visit.     Allergies  Allergen Reactions  . Lactose Intolerance (Gi) Diarrhea  . Ibuprofen Other (See Comments)    REACTION: Anxiousness, hyperventilates    ROS As per HPI  PE: Blood pressure 107/70, pulse 83, temperature (!) 97.3 F (36.3 C), temperature source Oral, height _0  (1.575 m), weight 158 lb 3.2 oz (71.8 kg), SpO2 (!) 86 %.RA.  After sitting a few minutes her sat was 97% RA. Gen: Alert, well appearing.  Patient is oriented to person, place, time, and situation. AFFECT: pleasant, lucid thought and speech. CV: RRR, no m/r/g.   LUNGS: CTA bilat, nonlabored resps, good aeration in all lung fields. EXT: no clubbing, cyanosis, or edema.    LABS:  Lab Results  Component Value Date   TSH 1.24 04/20/2017   Lab Results  Component Value Date   WBC 7.2 09/12/2017   HGB 11.7 (L) 09/12/2017   HCT 36.6 09/12/2017   MCV 86.9 09/12/2017   PLT 184 09/12/2017   Lab Results  Component Value Date   CREATININE 0.92 11/10/2017   BUN 17 11/10/2017   NA 138 11/10/2017   K 3.8 11/10/2017   CL 103 11/10/2017   CO2 28 11/10/2017   Lab Results  Component Value Date   ALT 10 04/20/2017   AST 14 04/20/2017   ALKPHOS 99  04/20/2017   BILITOT 0.3 04/20/2017   Lab Results   Component Value Date   CHOL 65 12/02/2016   Lab Results  Component Value Date   HDL 34 (L) 12/02/2016   Lab Results  Component Value Date   LDLCALC 23 12/02/2016   Lab Results  Component Value Date   TRIG 42 12/02/2016   Lab Results  Component Value Date   CHOLHDL 1.9 12/02/2016   Lab Results  Component Value Date   HGBA1C 8.1 (H) 11/10/2017    IMPRESSION AND PLAN:  1) DM 2, with diab retinopathy: Control not adequate lately. Hba1c today. Instructions: Increase your 70/30 insulin dosing as follows: Take 6 units PLUS sliding scale dose every morning with breakfast. Take 4 units PLUS sliding scale dose every evening with supper.  2) HLD: tolerating high dose statin. As per her cardiologist's f/u note 08/2017, will go ahead and have her split her atorva 32m tab in half and take this qd. When RF needed, they'll call our office and request the 422mtabs.  3) CRI stage II/III: avoiding NSaids. Needs to focus more on adequate hydration, esp as the hot months come on. BMET today.  An After Visit Summary was printed and given to the patient.  FOLLOW UP: Return in about 3 months (around 05/10/2018) for routine chronic illness f/u.  Signed:  PhCrissie SicklesMD           02/08/2018

## 2018-02-08 NOTE — Patient Instructions (Signed)
Increase your 70/30 insulin dosing as follows:  Take 6 units PLUS sliding scale dose every morning with breakfast. Take 4 units PLUS sliding scale dose every evening with supper.

## 2018-03-12 ENCOUNTER — Encounter (INDEPENDENT_AMBULATORY_CARE_PROVIDER_SITE_OTHER): Payer: Medicare Other | Admitting: Ophthalmology

## 2018-03-12 ENCOUNTER — Encounter: Payer: Self-pay | Admitting: Family Medicine

## 2018-03-12 DIAGNOSIS — E113513 Type 2 diabetes mellitus with proliferative diabetic retinopathy with macular edema, bilateral: Secondary | ICD-10-CM | POA: Diagnosis not present

## 2018-03-12 DIAGNOSIS — H43813 Vitreous degeneration, bilateral: Secondary | ICD-10-CM | POA: Diagnosis not present

## 2018-03-12 DIAGNOSIS — I10 Essential (primary) hypertension: Secondary | ICD-10-CM | POA: Diagnosis not present

## 2018-03-12 DIAGNOSIS — E11311 Type 2 diabetes mellitus with unspecified diabetic retinopathy with macular edema: Secondary | ICD-10-CM

## 2018-03-12 DIAGNOSIS — H35033 Hypertensive retinopathy, bilateral: Secondary | ICD-10-CM | POA: Diagnosis not present

## 2018-03-12 LAB — HM DIABETES EYE EXAM

## 2018-04-02 ENCOUNTER — Other Ambulatory Visit: Payer: Self-pay | Admitting: Cardiology

## 2018-04-11 ENCOUNTER — Other Ambulatory Visit: Payer: Self-pay | Admitting: Family Medicine

## 2018-04-11 DIAGNOSIS — Z794 Long term (current) use of insulin: Secondary | ICD-10-CM

## 2018-04-11 DIAGNOSIS — E118 Type 2 diabetes mellitus with unspecified complications: Secondary | ICD-10-CM

## 2018-04-12 ENCOUNTER — Ambulatory Visit
Admission: RE | Admit: 2018-04-12 | Discharge: 2018-04-12 | Disposition: A | Discharge status: Home / Self Care | Payer: Medicare Other | Source: Ambulatory Visit | Attending: Family Medicine | Admitting: Family Medicine

## 2018-04-12 ENCOUNTER — Other Ambulatory Visit: Payer: Self-pay | Admitting: Family Medicine

## 2018-04-12 DIAGNOSIS — M8589 Other specified disorders of bone density and structure, multiple sites: Secondary | ICD-10-CM | POA: Diagnosis not present

## 2018-04-12 DIAGNOSIS — E2839 Other primary ovarian failure: Secondary | ICD-10-CM

## 2018-04-12 DIAGNOSIS — Z1231 Encounter for screening mammogram for malignant neoplasm of breast: Secondary | ICD-10-CM | POA: Diagnosis not present

## 2018-04-12 DIAGNOSIS — Z1239 Encounter for other screening for malignant neoplasm of breast: Secondary | ICD-10-CM

## 2018-04-12 DIAGNOSIS — M810 Age-related osteoporosis without current pathological fracture: Secondary | ICD-10-CM | POA: Diagnosis not present

## 2018-04-12 DIAGNOSIS — Z780 Asymptomatic menopausal state: Secondary | ICD-10-CM | POA: Diagnosis not present

## 2018-04-12 HISTORY — DX: Age-related osteoporosis without current pathological fracture: M81.0

## 2018-04-13 ENCOUNTER — Encounter: Payer: Self-pay | Admitting: Family Medicine

## 2018-04-13 ENCOUNTER — Other Ambulatory Visit: Payer: Self-pay

## 2018-04-13 IMAGING — DX DG SHOULDER 2+V*R*
2 series · 2 of 2 positions shown · non-contrast
Comparison: None.

CLINICAL DATA: Recent fall with right shoulder pain, initial
encounter

EXAM:
RIGHT SHOULDER - 2+ VIEW

[shoulder grashey]
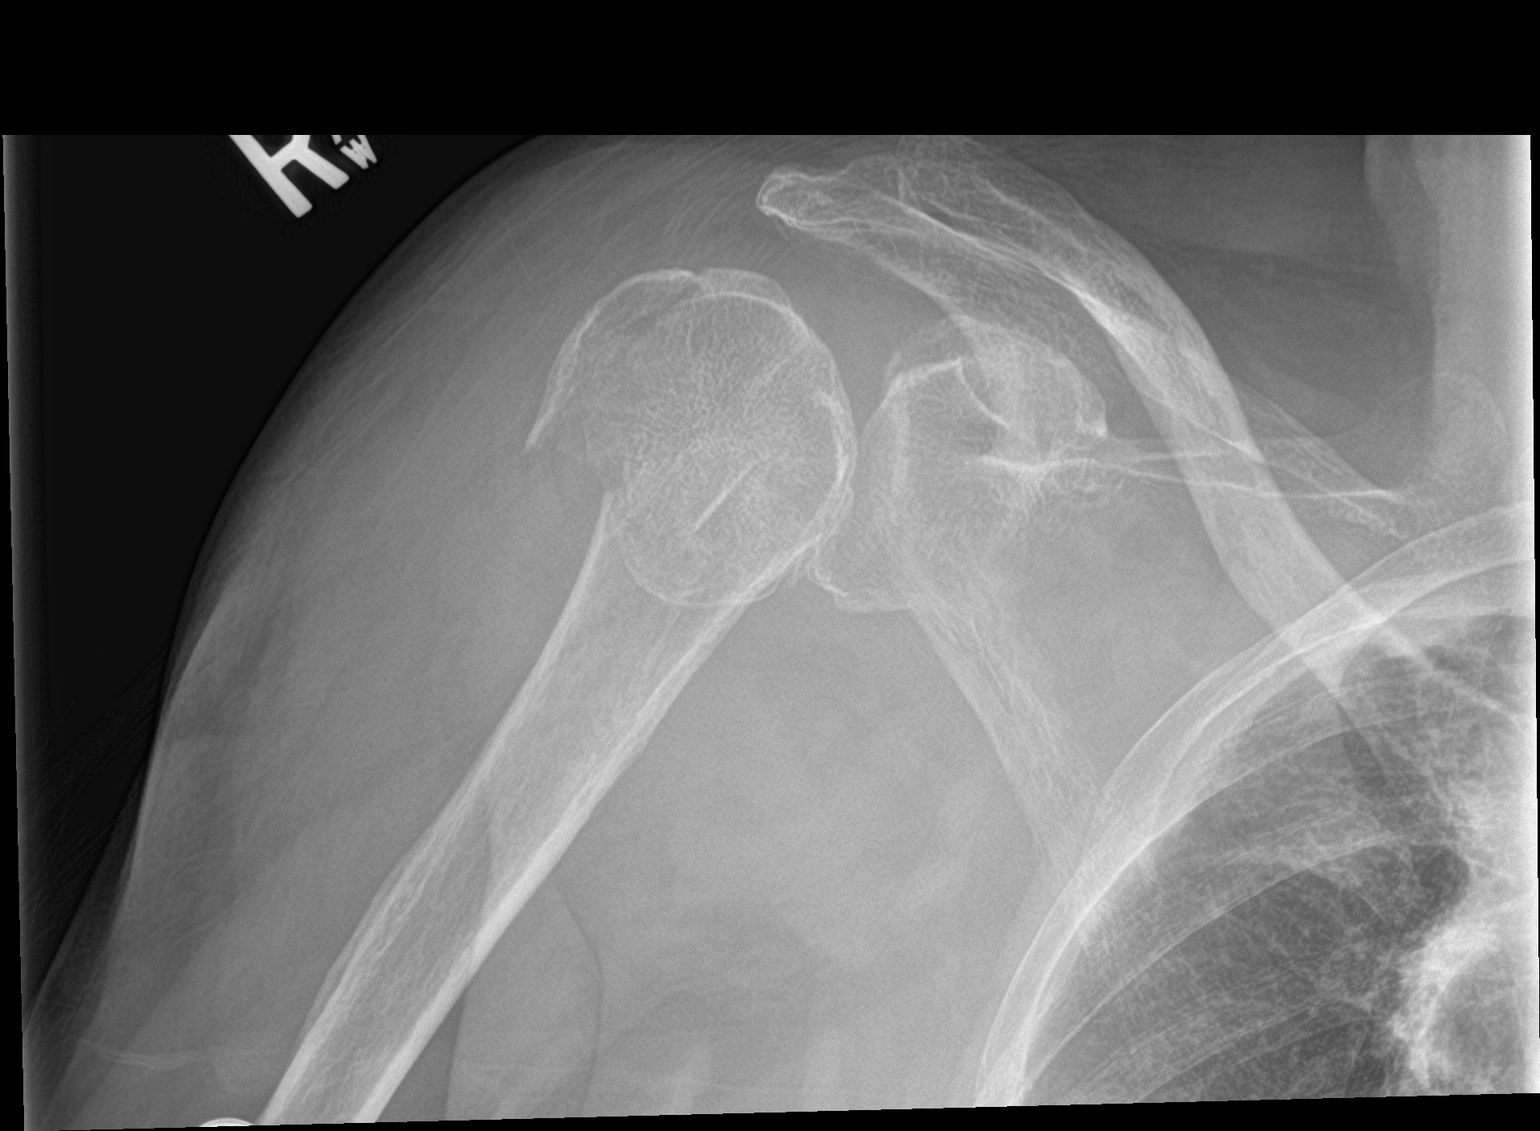

[shoulder y view]
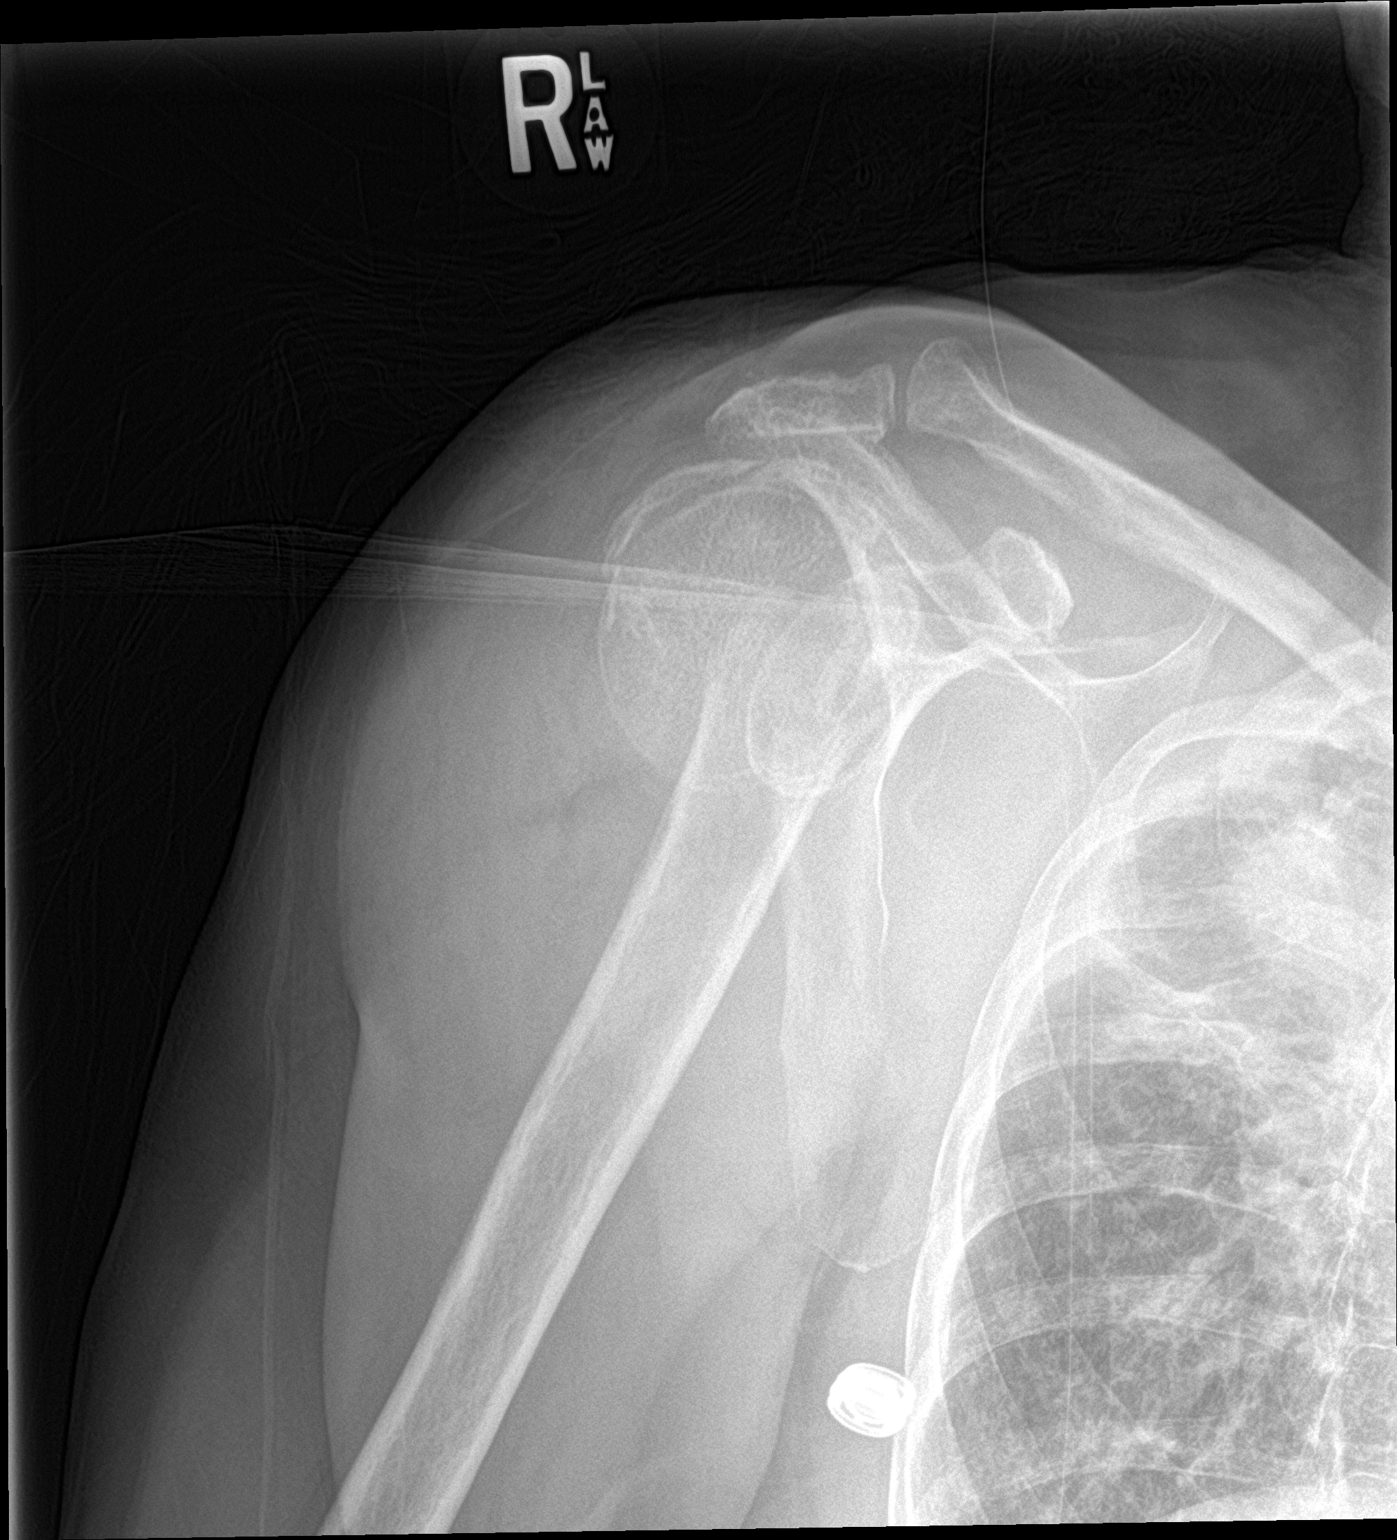

[2 of 2 positions shown; findings below may reference images not displayed]

FINDINGS: There is a comminuted fracture of the proximal right humerus with
some impaction at the fracture site. The surgical neck is involved
as well as multiple fracture lines extending into the humeral head.
No other bony abnormality is seen.
IMPRESSION: Comminuted proximal right humeral fracture.

## 2018-04-13 IMAGING — DX DG ELBOW 2V*R*
2 series · 2 of 2 positions shown · non-contrast
Comparison: None.

CLINICAL DATA: Recent fall with elbow pain, initial encounter

EXAM:
RIGHT ELBOW - 2 VIEW

[elbow ap]
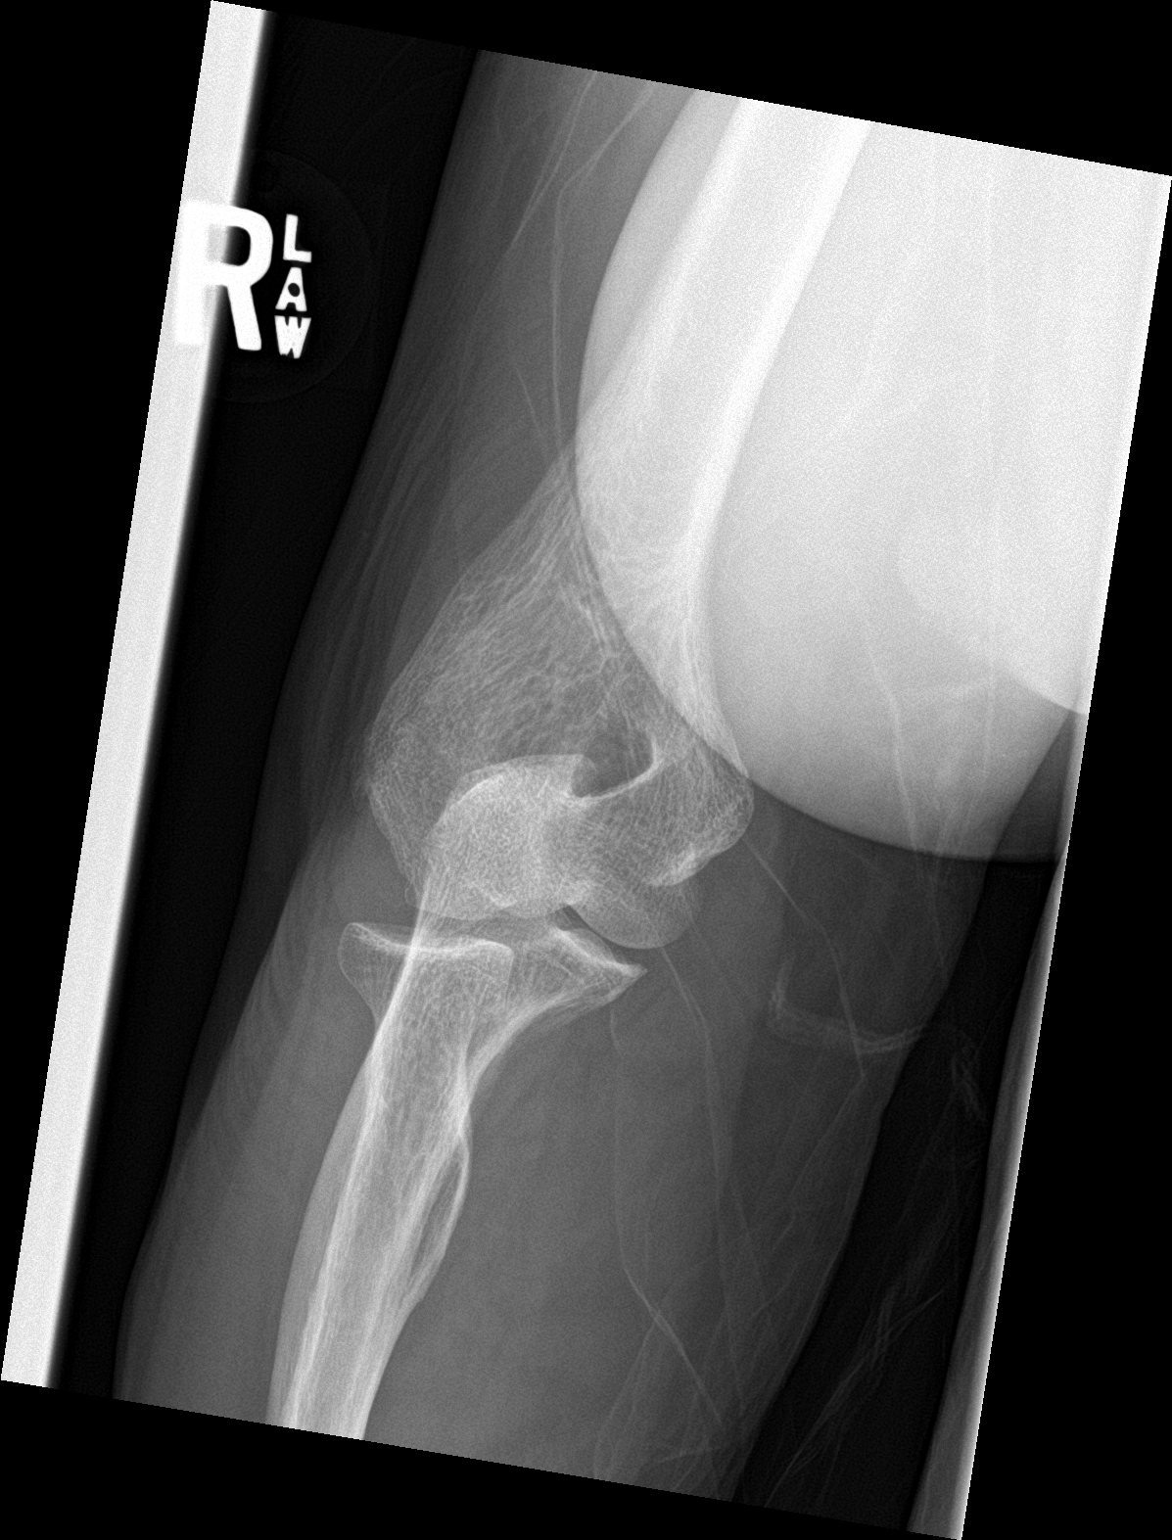

[elbow lat]
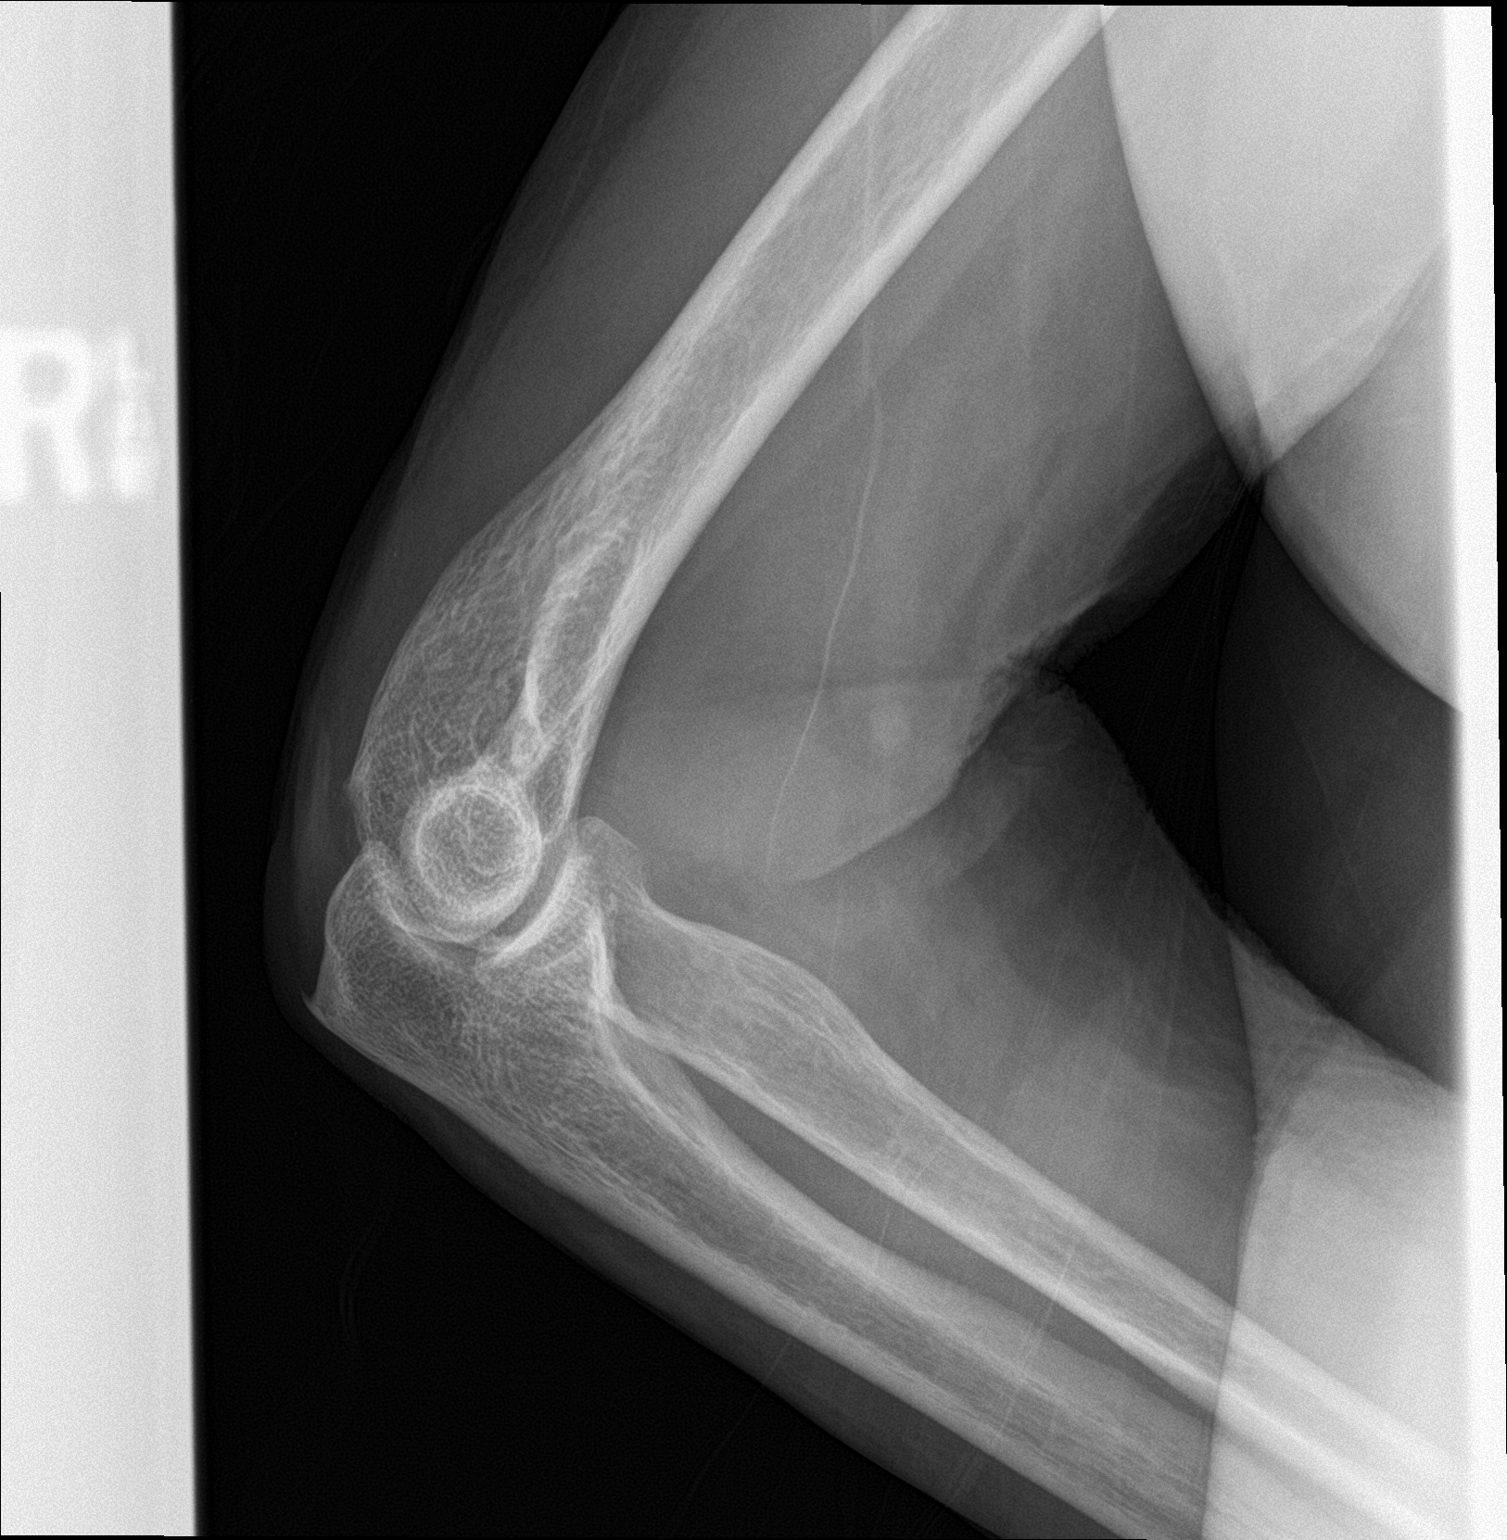

[2 of 2 positions shown; findings below may reference images not displayed]

FINDINGS: Mild spurring from the olecranon is noted. No acute fracture or
dislocation is seen. No joint effusion is noted.
IMPRESSION: No acute abnormality seen.

## 2018-04-13 IMAGING — CT CT HEAD W/O CM
4 of 6 series · 16 of 47 positions shown, 18 images · non-contrast
Comparison: Head CT September 26, 2016

CLINICAL DATA: Pain following fall

EXAM:
CT HEAD WITHOUT CONTRAST
CT CERVICAL SPINE WITHOUT CONTRAST
TECHNIQUE: Multidetector CT imaging of the head and cervical spine was
performed following the standard protocol without intravenous
contrast. Multiplanar CT image reconstructions of the cervical spine
were also generated.

[Series 3: head wo · axial · 0.39mm/px · z∈[+18,+123]mm · 6 of 31 slices shown, 8 images]
[im 5/31  brain]
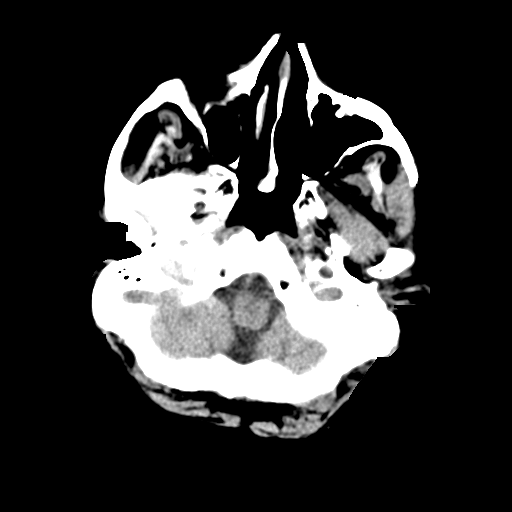
[im 5/31  bone]
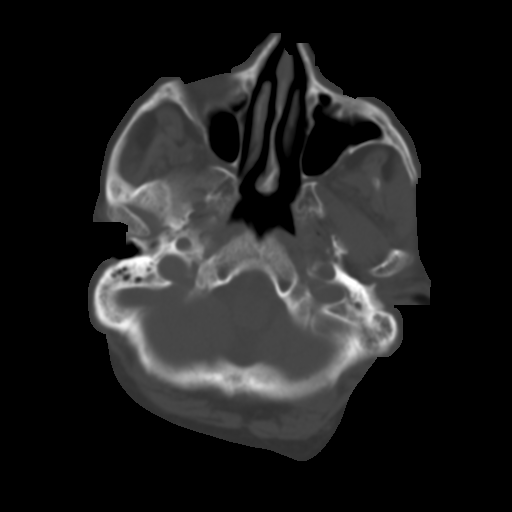
[im 9/31  brain]
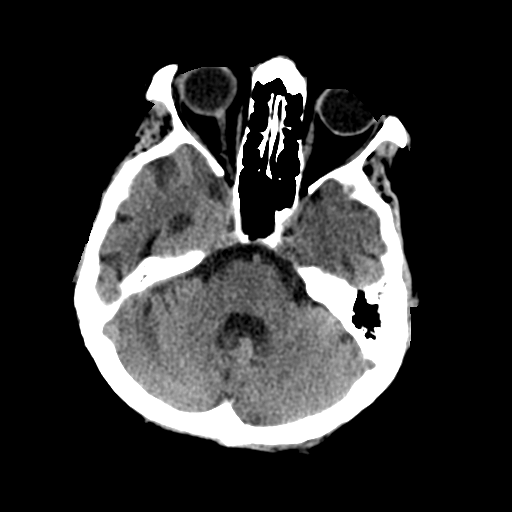
[im 13/31  brain]
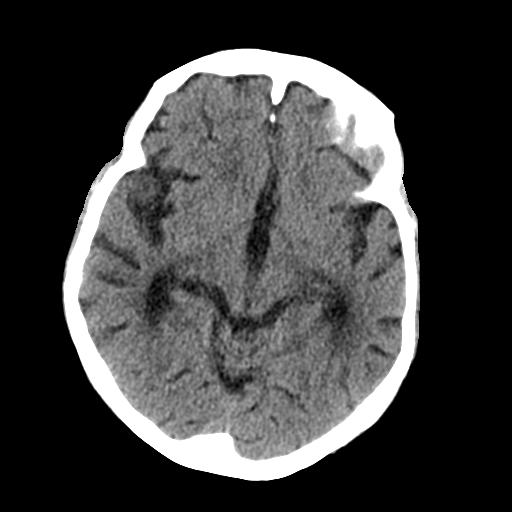
[im 18/31  brain]
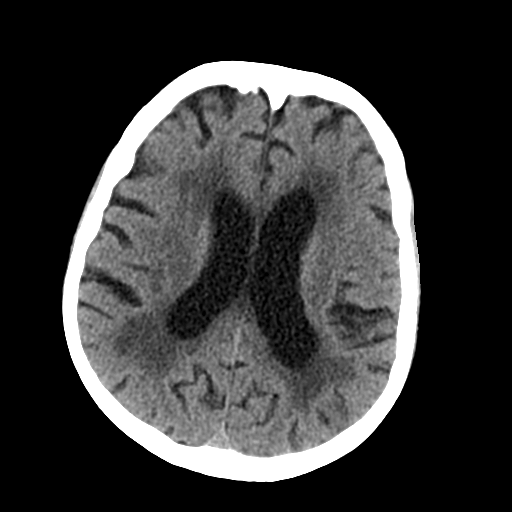
[im 22/31  brain]
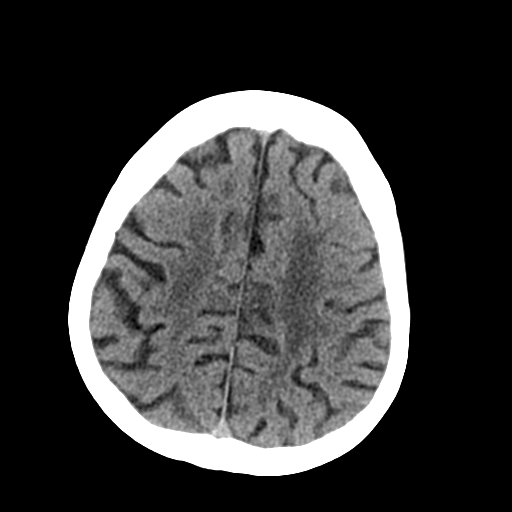
[im 22/31  bone]
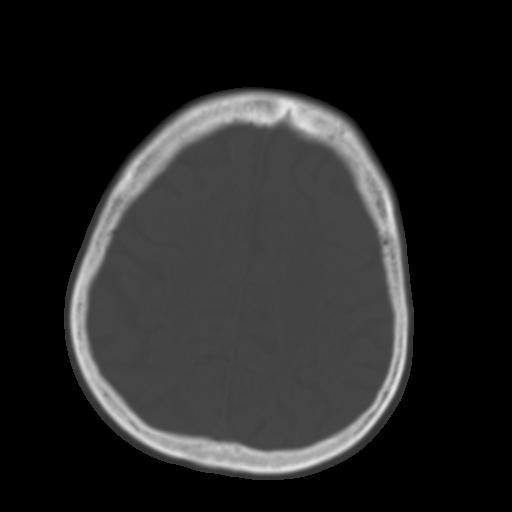
[im 26/31  brain]
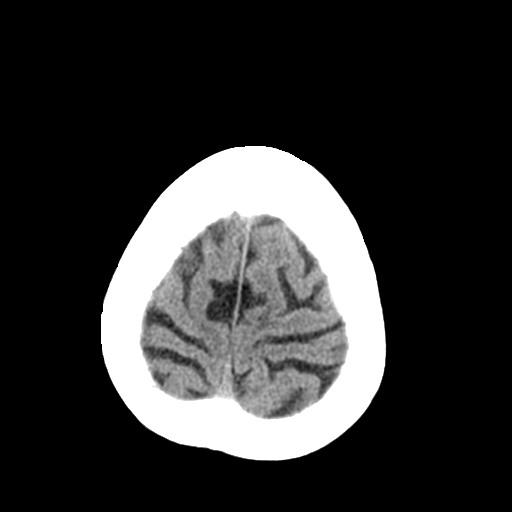

[Series 5: coronal soft tissue · coronal · 0.31mm/px · 3 of 59 slices shown]
[im 17/59  brain]
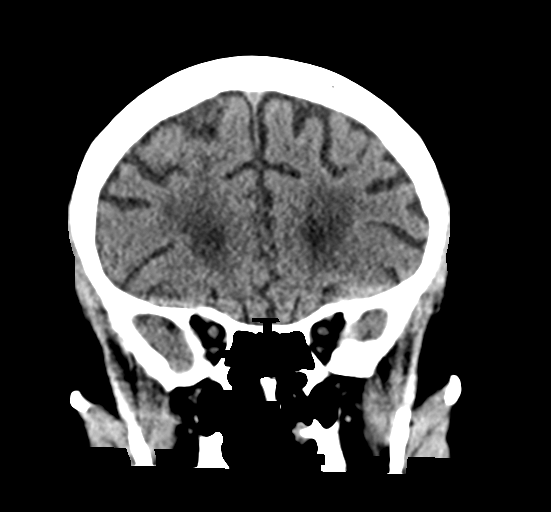
[im 25/59  brain]
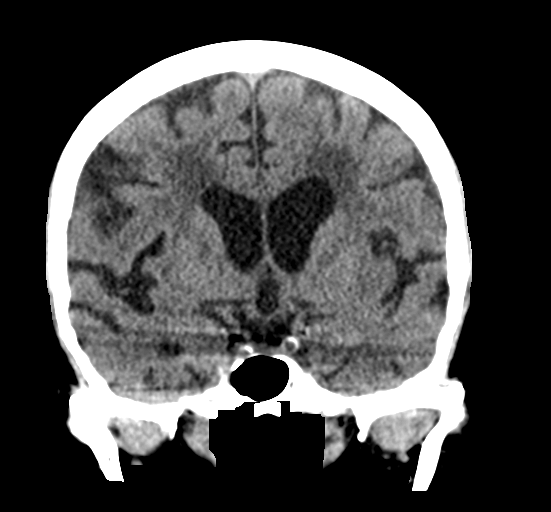
[im 34/59  brain]
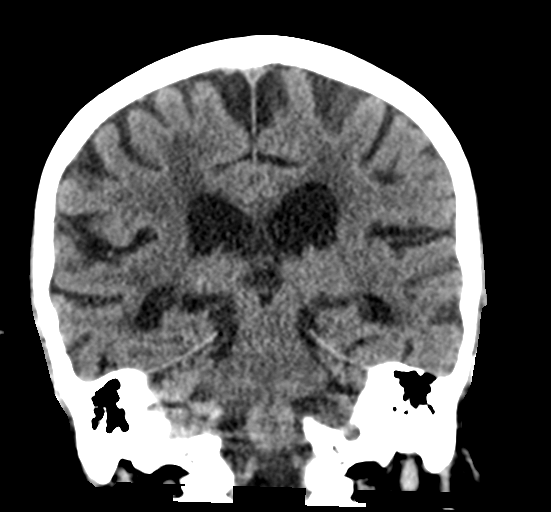

[Series 6: sagittal soft tissue · sagittal · 0.31mm/px · 1 of 51 slices shown]
[im 26/51  brain]
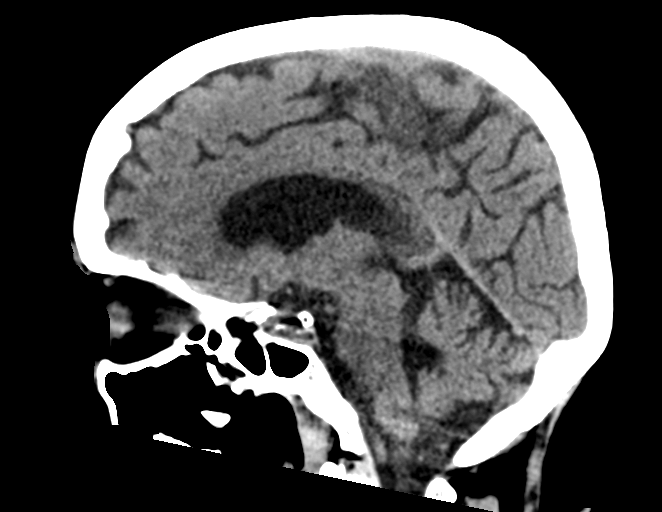

[Series 8: c spine soft · axial · 0.32mm/px · z∈[-132,-42]mm · 6 of 79 slices shown]
[im 8/79  brain]
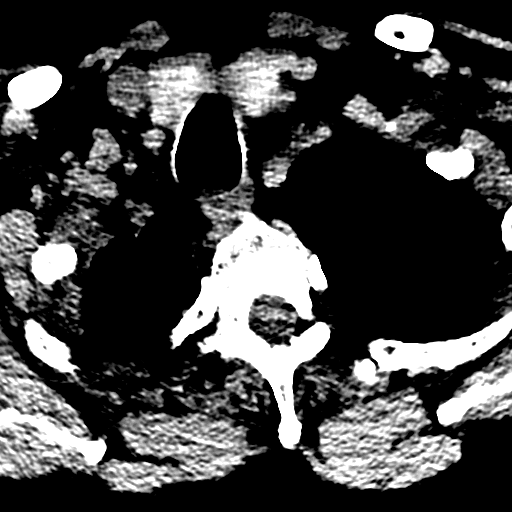
[im 15/79  brain]
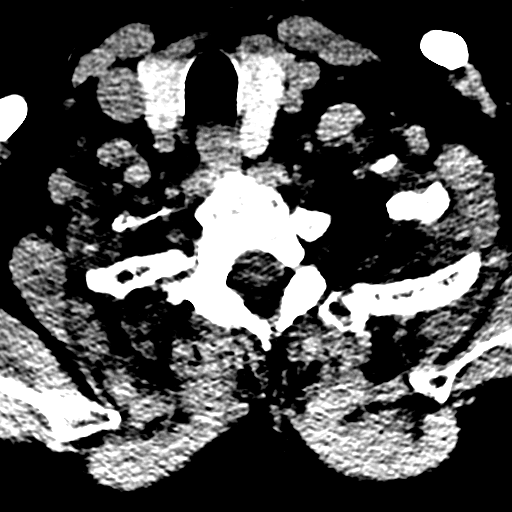
[im 27/79  brain]
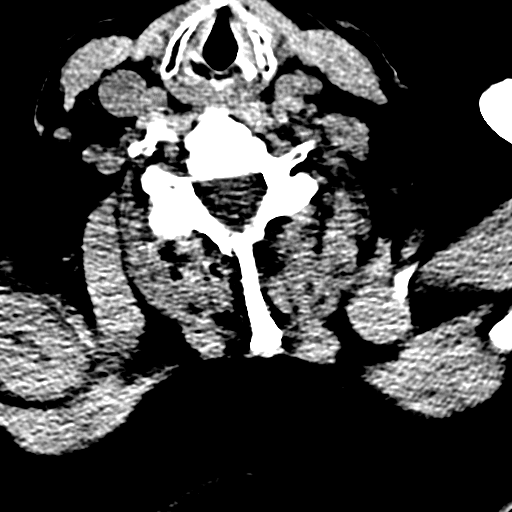
[im 34/79  brain]
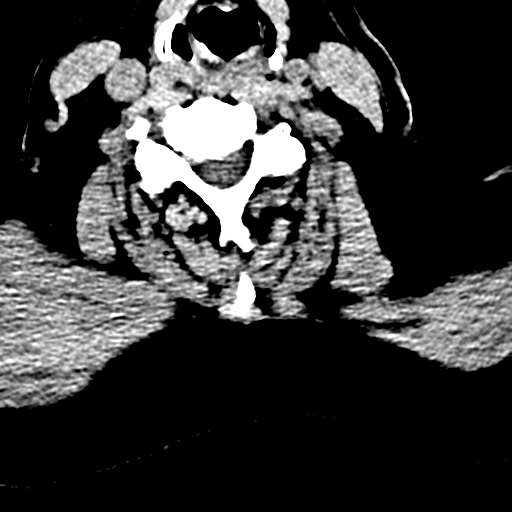
[im 45/79  brain]
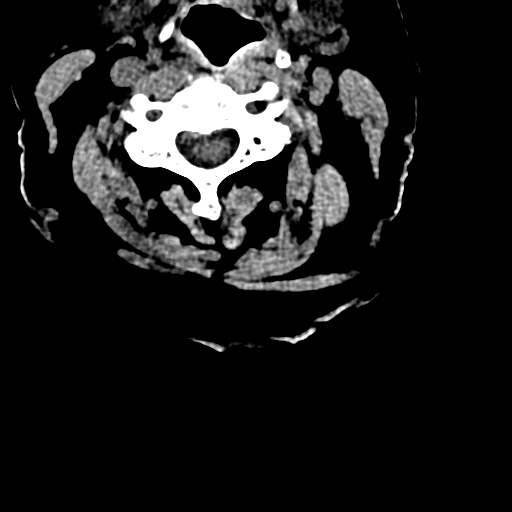
[im 53/79  brain]
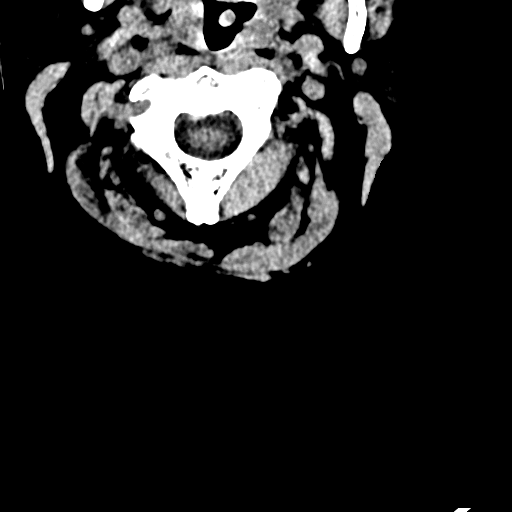

[16 of 47 positions shown; findings below may reference images not displayed]

FINDINGS: CT HEAD FINDINGS

Brain: There is moderate diffuse atrophy. There is no intracranial
mass, hemorrhage, extra-axial fluid collection, or midline shift.
There is small vessel disease throughout the centra semiovale
bilaterally. Elsewhere gray-white compartments appear normal. No
acute infarct evident.

Vascular: No hyperdense vessel. There is slight calcification in
each carotid siphon.

Skull: Bony calvarium appears intact.

Sinuses/Orbits: Visualized paranasal sinuses are clear. Frontal
sinuses are aplastic. Orbits appear symmetric bilaterally.

Other: There is opacification of multiple mastoid air cells on the
left, a chronic finding. Mastoid air cells on the right are clear.

CT CERVICAL SPINE FINDINGS

Alignment: There is lower cervical levoscoliosis. No appreciable
spondylolisthesis.

Skull base and vertebrae: Skull base and craniocervical junction
regions appear normal. There is no evident fracture. There are no
blastic or lytic bone lesions.

Soft tissues and spinal canal: Prevertebral soft tissues and
predental space regions are normal. No paraspinous lesions. No
evident cord or canal hematoma.

Disc levels: There is moderately severe disc space narrowing at
C4-5. Other disc spaces appear unremarkable. There is facet
osteoarthritic change at several levels bilaterally. There is exit
foraminal narrowing on the right at C3-4 due to bony hypertrophy.
There is marked exit foraminal narrowing at C4-5 on the right with
impression on the exiting nerve root. There is no disc extrusion or
stenosis.

Upper chest: There is scarring and pleural thickening in the right
apex. Visualized upper lung zones otherwise clear.

Other: There are foci of calcification in each carotid artery.
IMPRESSION: CT head: Atrophy with periventricular small vessel disease, stable.
No intracranial mass, hemorrhage, or extra-axial fluid collection.
No acute infarct evident.

Mild arterial vascular calcification. Chronic left mastoid air cell
disease.

CT cervical spine: Scoliosis. No fracture or spondylolisthesis.
Osteoarthritic changes several levels. Scarring right apex. Carotid
artery calcification bilaterally.

## 2018-04-13 IMAGING — DX DG RIBS W/ CHEST 3+V*R*
4 series · 4 of 4 positions shown · non-contrast
Comparison: 12/09/2016

CLINICAL DATA: Recent fall with right-sided pain, initial encounter

EXAM:
RIGHT RIBS AND CHEST - 3+ VIEW

[chest pa]
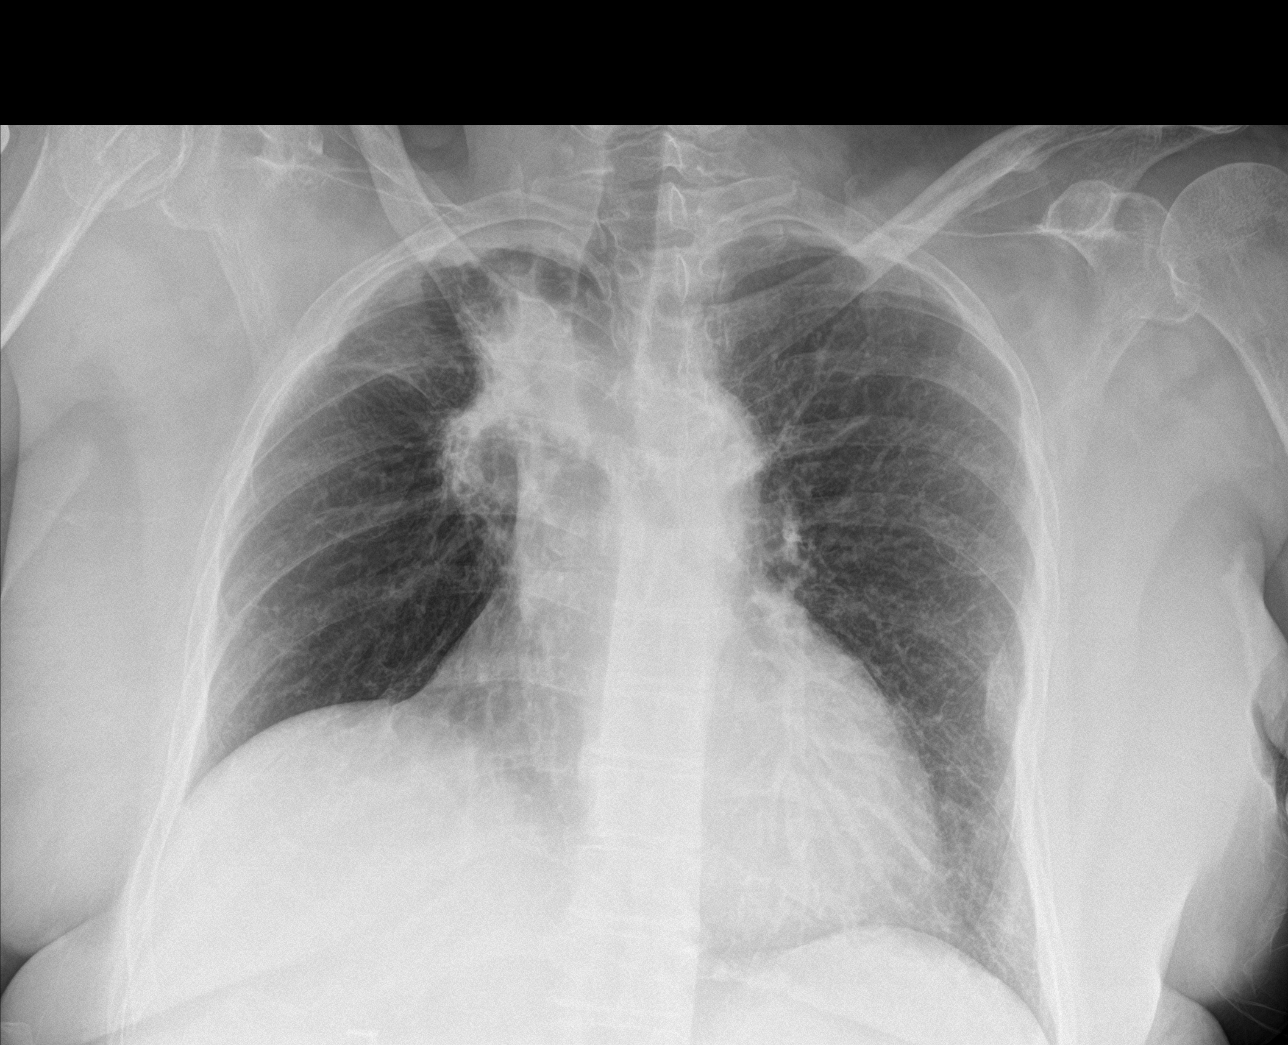

[rib pa]
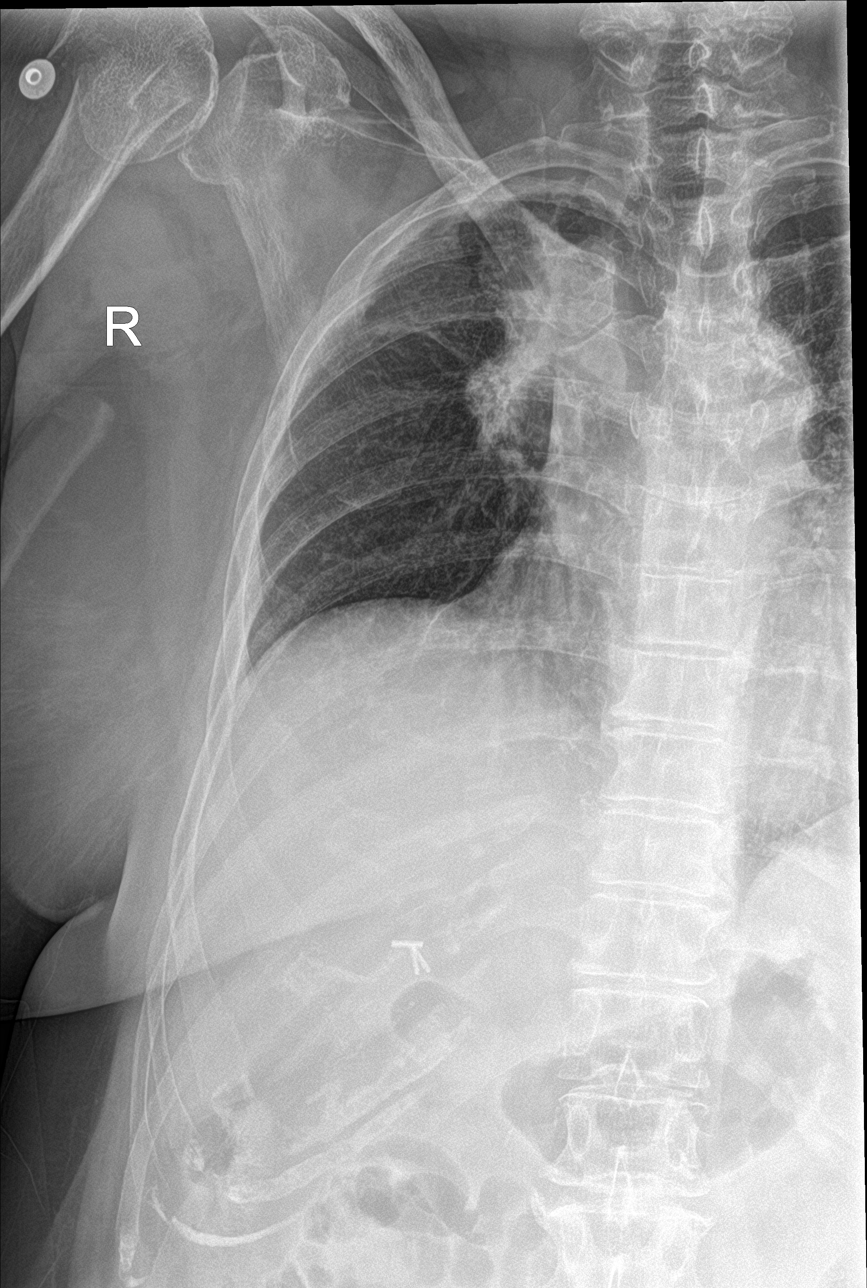

[rib pa obl (1 of 2)]
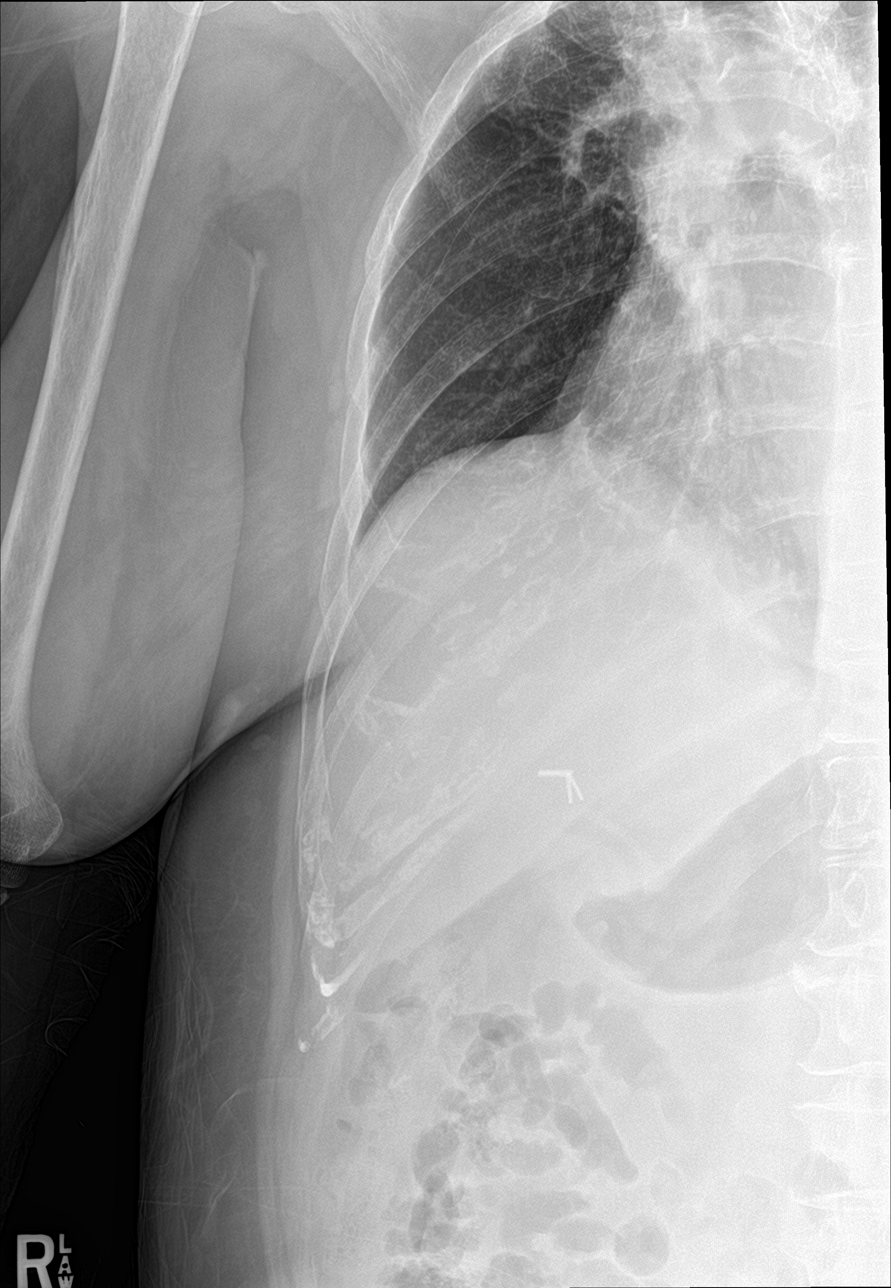

[rib pa obl (2 of 2)]
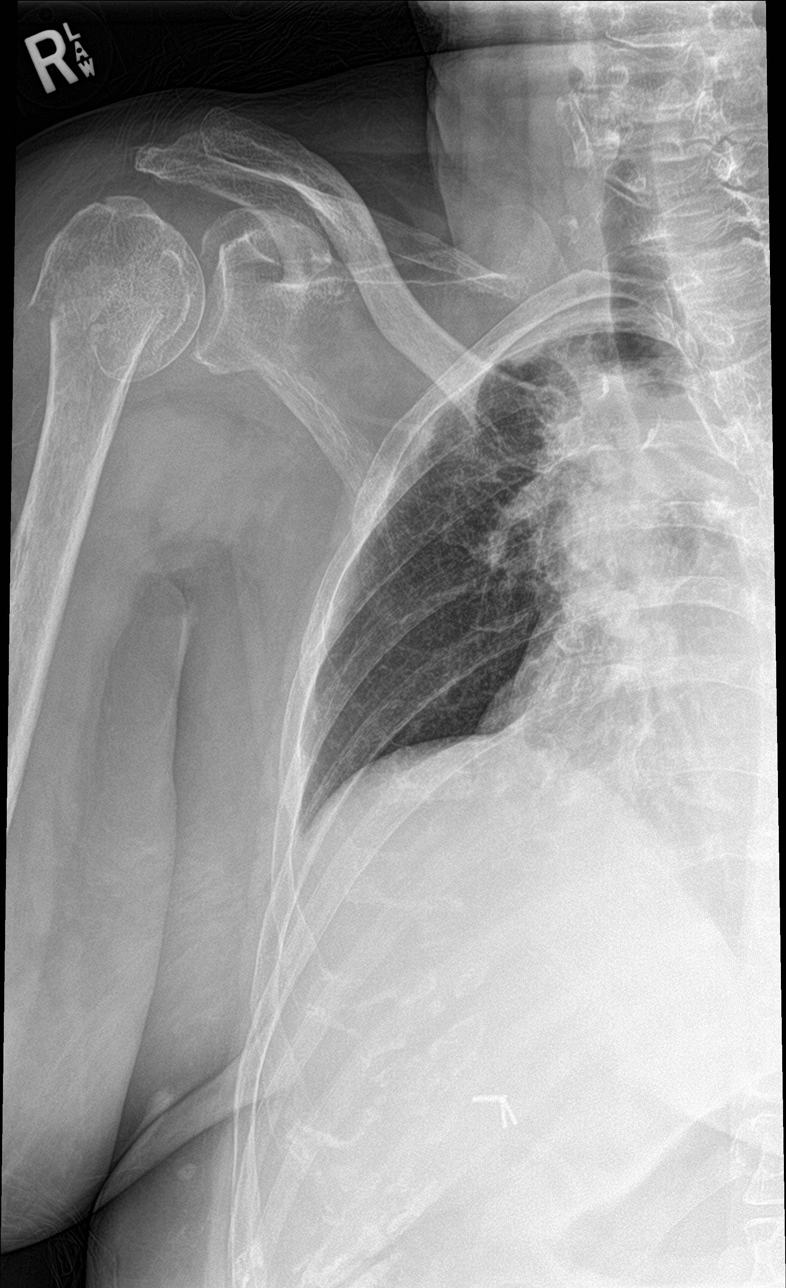

[4 of 4 positions shown; findings below may reference images not displayed]

FINDINGS: Cardiac shadow is stable. Aortic calcifications are again seen. Soft
tissue density is noted in the right peritracheal region likely
predominately related to radiation change. The possibility of
recurrent neoplasm could not be totally excluded. No pneumothorax is
seen. The comminuted fracture of the proximal right humerus is
noted. Some angulation is noted along the inferior aspect of the
glenoid which could be related to a focal fracture. No definitive
acute rib fracture is noted.
IMPRESSION: No evidence of definitive rib fracture.

Comminuted right humeral fracture is again seen. Some irregularity
of the glenoid is noted which may be related to a fracture.

## 2018-04-13 MED ORDER — IBANDRONATE SODIUM 150 MG PO TABS: 150.0000 mg | ORAL_TABLET | ORAL | 3 refills | Status: DC

## 2018-04-15 ENCOUNTER — Other Ambulatory Visit: Payer: Self-pay | Admitting: Cardiology

## 2018-04-16 NOTE — Telephone Encounter (Signed)
Rx request sent to pharmacy.  

## 2018-05-03 ENCOUNTER — Other Ambulatory Visit: Payer: Self-pay | Admitting: Family Medicine

## 2018-05-03 DIAGNOSIS — E782 Mixed hyperlipidemia: Secondary | ICD-10-CM

## 2018-05-03 DIAGNOSIS — E785 Hyperlipidemia, unspecified: Secondary | ICD-10-CM

## 2018-05-04 NOTE — Telephone Encounter (Signed)
Yes, I went ahead and authorized these RFs.-thx

## 2018-05-04 NOTE — Telephone Encounter (Signed)
Patient requesting rf of atorvastatin.  I couldn't find recent lipid.  Okay to RF?  Please advise.

## 2018-05-10 ENCOUNTER — Encounter: Payer: Self-pay | Admitting: Family Medicine

## 2018-05-10 ENCOUNTER — Ambulatory Visit (INDEPENDENT_AMBULATORY_CARE_PROVIDER_SITE_OTHER): Payer: Medicare Other | Admitting: Family Medicine

## 2018-05-10 VITALS — BP 114/73 | HR 74 | Temp 97.5°F | Resp 16 | Ht 62.0 in | Wt 161.0 lb

## 2018-05-10 DIAGNOSIS — N183 Chronic kidney disease, stage 3 unspecified: Secondary | ICD-10-CM

## 2018-05-10 DIAGNOSIS — F331 Major depressive disorder, recurrent, moderate: Secondary | ICD-10-CM | POA: Diagnosis not present

## 2018-05-10 DIAGNOSIS — E7800 Pure hypercholesterolemia, unspecified: Secondary | ICD-10-CM | POA: Diagnosis not present

## 2018-05-10 DIAGNOSIS — I10 Essential (primary) hypertension: Secondary | ICD-10-CM | POA: Diagnosis not present

## 2018-05-10 DIAGNOSIS — E118 Type 2 diabetes mellitus with unspecified complications: Secondary | ICD-10-CM | POA: Diagnosis not present

## 2018-05-10 DIAGNOSIS — Z794 Long term (current) use of insulin: Secondary | ICD-10-CM | POA: Diagnosis not present

## 2018-05-10 LAB — BASIC METABOLIC PANEL
BUN: 13 mg/dL (ref 6–23)
CHLORIDE: 99 meq/L (ref 96–112)
CO2: 30 mEq/L (ref 19–32)
Calcium: 8.9 mg/dL (ref 8.4–10.5)
Creatinine, Ser: 1.08 mg/dL (ref 0.40–1.20)
GFR: 53.33 mL/min — AB (ref >60.00)
Glucose, Bld: 306 mg/dL — ABNORMAL HIGH (ref 70–99)
POTASSIUM: 4.4 meq/L (ref 3.5–5.1)
SODIUM: 137 meq/L (ref 135–145)

## 2018-05-10 LAB — HEMOGLOBIN A1C: Hgb A1c MFr Bld: 9.3 % — ABNORMAL HIGH (ref 4.6–6.5)

## 2018-05-10 MED ORDER — ESCITALOPRAM OXALATE 10 MG PO TABS: 10.0000 mg | ORAL_TABLET | Freq: Every day | ORAL | 1 refills | Status: DC

## 2018-05-10 MED ORDER — METFORMIN HCL 500 MG PO TABS: 500.0000 mg | ORAL_TABLET | Freq: Two times a day (BID) | ORAL | 1 refills | Status: DC

## 2018-05-10 MED ORDER — INSULIN ISOPHANE & REGULAR (HUMAN 70-30)100 UNIT/ML KWIKPEN: PEN_INJECTOR | SUBCUTANEOUS | 1 refills | Status: DC

## 2018-05-10 NOTE — Patient Instructions (Signed)
Increase your base dose of insulin 70/30 to 9 Units in the morning and 7 units in the evening. Continue current sliding scale insulin at each dosing of insulin.  I've prescribed metformin for you to restart.

## 2018-05-10 NOTE — Progress Notes (Signed)
OFFICE VISIT  05/10/2018   CC:  Chief Complaint  Patient presents with  . Follow-up    RCI     HPI:    Patient is a 72 y.o. Caucasian female who presents accompanied by her daughter for 3 mo f/u DM 2, HLD, CRI with GFR 60s. Last visit DM was not as well controlled: Instructions at that time: Increase your 70/30 insulin dosing as follows: Take 6 units PLUS sliding scale dose every morning with breakfast. Take 4 units PLUS sliding scale dose every evening with supper  Glucs mid 200s to mid 300s still.  Having to take avg of 12-14 U each dose (base+ SSI).  She avoids NSAIDs and tries to hydrate well but daughter says pt doesn't do a very good job of this.  HLD: tolerating statin, no side effects.  Pt's depression is re-emerging last couple months: +anhedonia, depressed mood, sleep problems, fatigue, overeating. No SI or HI.  She has been compliant with 81m lexapro qd.  ROS: no CP, no SOB, no fevers, no abd pain, no n/v, no melena or hematochezia.  No palpitations or dizziness.  Past Medical History:  Diagnosis Date  . Age-related nuclear cataract of both eyes 2016   +cortical age related cataracts OU   . Allergy    seasonal  . Arthritis    hands  . Bilateral diabetic retinopathy (HCC) 2015   Dr. MZigmund Daniel . Blood transfusion without reported diagnosis    when taking chemo  . Chronic diastolic heart failure (HCC)    Takotsubo CM (resolved @5 /2018):  a. 08/2016 Echo: EF 30-35%, gr1DD, PASP 328mg;  b. 11/2016 Echo: EF 40-45%, no rwma, nl RV fxn, mild TR, PASP 3772m.  . CKD (chronic kidney disease), stage II    GFR 60s  . 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  . CMarland KitchenPD (chronic obstructive pulmonary disease) ([place] . CVA (cerebral vascular accident) ([place]   MRI did show a left-sided small ischemic stroke which was acute but likely incidental (MRI was done b/c pt had TIA sx's in hospital).  Neuro put her on plavix at that  time.  Cardiology d/c'd her plavix 08/2017.  . Diabetes mellitus with complication (HCC)    diab retinopathy OU (laser)  . GERD (gastroesophageal reflux disease)    protonix  . History of diverticulitis of colon    with abscess; required IR percutaneous drain placed 09/2016.  . HMarland Kitchenperlipidemia   . Hypertension   . Left bundle branch block (LBBB) 12/16/2016  . Lung cancer [place]  non-small cell lung ca, stage III in 05/2011; systemic chemotherapy concurrent with radiation followed by prophylactic cranial irradiation and has been observation since July of 2010 with no evidence for disease recurrence-released from onc f/u 12/2014 (needs annual cxr by PCP).  CXR 08/2015 stable.  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 MohEarlie Serverd bx and this was NEG for malignancy.  . Mild cognitive impairment with memory loss    Likely from brain radiation therapy  . Non-obstructive CAD (coronary artery disease)    a. Cath 2006 preserved LV fxn, scattered irregularities without critical stenosis; b. 2008 stress echo negative for ischemia, but with hypertensive response; c. 08/2016 Cath: D1 25%, otw nl.  . Normocytic anemia 2018   Iron, vit B12, folate all normal 12/2016.  . OMarland Kitchenen-angle glaucoma 2016   Dr. BerShirley Muscatbilateral)---responding to topical therapy  . Osteoporosis 03/2016; 03/2018   03/2016 DEXA T-score -2.2.  03/2018 T-score  L femoral neck  -2.7--boniva started.  . Pneumonia    hospitalization 11/2016; hypoxemic resp failure--d/c'd home with home oxygen therapy  . Subclinical hyperthyroidism 2018  . Takotsubo cardiomyopathy    a. 08/2016 Echo: EF 30-35%, gr1DD, PASP 59mHg;  b. 08/2016 Cath: nonobs Dzs;  c. 11/2016 Echo: EF 40-45%, no rwma, nl RV fxn, mild TR, PASP 332mg.  ECHO 02/2017 EF 50-55%, grd II DD---essentially resolved Takotsubo 02/2017.  . Torsades de pointes (HCWood[place]11/2017   a. 08/2016 in setting of diverticulitis & pneumoperitoneum and Takotsubo CM -->prolonged QT, seen by EP-->avoid meds with  potential for QT prolongation.    Past Surgical History:  Procedure Laterality Date  . ABDOMINAL HYSTERECTOMY  1997  . APPENDECTOMY    . CARDIAC CATHETERIZATION N/A 09/14/2016   Procedure: Left Heart Cath and Coronary Angiography;  Surgeon: ThTroy SineMD;  [facility];  Service: Cardiovascular; Mild nonobstructive CAD with 85% smooth narrowing in the first diagonal branch of the LAD; 10% smooth narrowing of the ostial proximal left circumflex coronary artery; and a normal dominant RCA.  . Marland KitchenARPAL TUNNEL RELEASE    . CATARACT EXTRACTION    . CHOLECYSTECTOMY  2002  . COLONOSCOPY  10/24/00   normal.  [employer] hemoccult testing via [place]/25/16 was NEG  . dexa  03/2016; 03/2018   03/2018 T-score L femoral neck  -2.7 (worsened compared to osteopenic range 2017.)  . IR GENERIC HISTORICAL  09/19/2016   IR SINUS/FIST TUBE CHK-NON GI 09/19/2016 JaCorrie MckusickDO [facility]  . IR GENERIC HISTORICAL  10/06/2016   IR RADIOLOGIST EVAL & MGMT 10/06/2016 [facility]  . IR GENERIC HISTORICAL  10/26/2016   IR RADIOLOGIST EVAL & MGMT 10/26/2016 [facility]  . IR GENERIC HISTORICAL  11/03/2016   IR RADIOLOGIST EVAL & MGMT 11/03/2016 WeArdis RowanPA-C [facility]  . IR GENERIC HISTORICAL  11/24/2016   IR RADIOLOGIST EVAL & MGMT 11/24/2016 WeArdis RowanPA-C [facility]  . IR GENERIC HISTORICAL  12/05/2016   Fistula smaller/improving.  IR SINUS/FIST TUBE CHK-NON GI 12/05/2016 JoSandi MariscalMD [facility]  . IR GENERIC HISTORICAL  12/22/2016   IR RADIOLOGIST EVAL & MGMT 12/22/2016 MiGreggory KeenMD [facility]  . IR RADIOLOGIST EVAL & MGMT  01/10/2017  . IR RADIOLOGIST EVAL & MGMT  02/09/2017  . IR SINUS/FIST TUBE CHK-NON GI  03/28/2017  . LEFT HEART CATHETERIZATION WITH CORONARY ANGIOGRAM N/A 05/30/2012   Procedure: LEFT HEART CATHETERIZATION WITH CORONARY ANGIOGRAM;  Surgeon: ChBurnell BlanksMD;  [facility];  Service: Cardiovascular: Mild,  non-obstructive CAD  . TRANSTHORACIC ECHOCARDIOGRAM  09/14/2016; 11/2016   08/2016: EF 30-35 %, Akinesis of the mid-apicalanteroseptal myocardium.  Grade I DD.  Mild pulm HTN.  Repeat echo 11/2016, EF 40-45%, no rwma, nl RV fxn, mild TR, PASP 3774m.    . TMarland KitchenANSTHORACIC ECHOCARDIOGRAM  03/07/2017   EF 50-55%. Hypokinesis of the distal septum with overall low normal LV,  systolic function; mild diastolic dysfunction with elevated LV  filling pressure; mildly calcified aortic valve with mild AI; small pericardial effusion    Outpatient Medications Prior to Visit  Medication Sig Dispense Refill  . acetaminophen (TYLENOL) 500 MG tablet Take 500-1,000 mg by mouth every 6 (six) hours as needed for headache (or pain).     . aMarland Kitchenbuterol (PROAIR HFA) 108 (90 Base) MCG/ACT inhaler Inhale 2 puffs into the lungs every 6 (six) hours as needed for wheezing  or shortness of breath. 18 g 3  . albuterol (PROVENTIL) (2.5 MG/3ML) 0.083% nebulizer solution USE ONE VIAL IN NEBULIZER EVERY 4 HOURS AS NEEDED FOR WHEEZING OR SHORTNESS OF BREATH 75 mL 3  . aspirin 81 MG chewable tablet Chew 81 mg by mouth daily.    Marland Kitchen atorvastatin (LIPITOR) 80 MG tablet TAKE 1 TABLET BY MOUTH ONCE DAILY 90 tablet 1  . cetirizine (ZYRTEC) 10 MG tablet Take 10 mg by mouth daily.    . fluticasone (FLOVENT HFA) 220 MCG/ACT inhaler Inhale 2 puffs into the lungs 2 (two) times daily. 12 g 6  . furosemide (LASIX) 20 MG tablet Take 1 tablet (20 mg total) by mouth daily. 90 tablet 1  . ibandronate (BONIVA) 150 MG tablet Take 1 tablet (150 mg total) by mouth every 30 (thirty) days. Take in the morning with a full glass of water, on an empty stomach, and do not take anything else by mouth or lie down for the next 30 min. 3 tablet 3  . metoprolol tartrate (LOPRESSOR) 25 MG tablet TAKE 1 TABLET BY MOUTH IN THE MORNING AND 2 AT BEDTIME.Please schedule appointment for refills. 270 tablet 0  . nitroGLYCERIN (NITROSTAT) 0.4 MG SL tablet Place 1 tablet (0.4 mg  total) under the tongue every 5 (five) minutes as needed. For chest pain 50 tablet 3  . ONE TOUCH ULTRA TEST test strip USE  STRIP TO CHECK GLUCOSE THREE TIMES DAILY 100 each 11  . ONETOUCH DELICA LANCETS 24M MISC USE 1  TO CHECK GLUCOSE THREE TIMES DAILY 100 each 11  . pantoprazole (PROTONIX) 40 MG tablet Take 1 tablet (40 mg total) by mouth daily. 90 tablet 3  . potassium chloride (K-DUR) 10 MEQ tablet TAKE 1 TABLET BY MOUTH ONCE DAILY 90 tablet 1  . escitalopram (LEXAPRO) 5 MG tablet Take 1 tablet (5 mg total) by mouth daily. 90 tablet 3  . glucose blood test strip Use to check blood sugar three times daily 100 each 12  . Insulin Isophane & Regular Human (HUMULIN 70/30 KWIKPEN) (70-30) 100 UNIT/ML PEN TAKE PER SLIDING SCALE EVERY MORNING AND EVENING 140-199 2UNITS, 200-250 4 UNITS, 251-299 6 UNITS, 300-349 8 UNITS, 350 OR ABOVE 10 UNITS 15 pen 1   No facility-administered medications prior to visit.     Allergies  Allergen Reactions  . Lactose Intolerance (Gi) Diarrhea  . Ibuprofen Other (See Comments)    REACTION: Anxiousness, hyperventilates    ROS As per HPI  PE: Blood pressure 114/73, pulse 74, temperature (!) 97.5 F (36.4 C), temperature source Oral, resp. rate 16, height 5' 2"  (1.575 m), weight 161 lb (73 kg), SpO2 98 %. Gen: Alert, well appearing.  Patient is oriented to person, place, and situation. AFFECT: pleasant, lucid thought and speech. CV: RRR, no m/r/g.   LUNGS: CTA bilat, nonlabored resps, good aeration in all lung fields. ABD: soft, NT/ND      LABS:  Lab Results  Component Value Date   TSH 1.24 04/20/2017   Lab Results  Component Value Date   WBC 7.2 09/12/2017   HGB 11.7 (L) 09/12/2017   HCT 36.6 09/12/2017   MCV 86.9 09/12/2017   PLT 184 09/12/2017   Lab Results  Component Value Date   CREATININE 1.14 02/08/2018   BUN 13 02/08/2018   NA 138 02/08/2018   K 4.3 02/08/2018   CL 101 02/08/2018   CO2 28 02/08/2018   Lab Results  Component  Value Date   ALT 10 04/20/2017  AST 14 04/20/2017   ALKPHOS 99 04/20/2017   BILITOT 0.3 04/20/2017   Lab Results  Component Value Date   CHOL 65 12/02/2016   Lab Results  Component Value Date   HDL 34 (L) 12/02/2016   Lab Results  Component Value Date   LDLCALC 23 12/02/2016   Lab Results  Component Value Date   TRIG 42 12/02/2016   Lab Results  Component Value Date   CHOLHDL 1.9 12/02/2016   Lab Results  Component Value Date   HGBA1C 8.3 (H) 02/08/2018    IMPRESSION AND PLAN:  1) DM 2, poor control still. HbA1c today.  BMET today. Instructions: Increase your base dose of insulin 70/30 to 9 Units in the morning and 7 units in the evening. Continue current sliding scale insulin at each dosing of insulin. I've prescribed metformin for you to restart.  2) CRI III: avoid nephrotoxic agents, HYDRATE BETTER. BMET today.  3) HLD: tolerating statin: LDL 23 11/2016.  Have not had opportunity since then to check FASTING lipids. No changes today.  4) MDD, recurrent: re-emerging sx's-->will increase lexapro to 82m qd. Therapeutic expectations and side effect profile of medication discussed today.  Patient's questions answered.  An After Visit Summary was printed and given to the patient.  FOLLOW UP: Return in about 3 months (around 08/10/2018).  Signed:  PCrissie Sickles MD           05/10/2018

## 2018-05-14 ENCOUNTER — Other Ambulatory Visit: Payer: Self-pay | Admitting: Family Medicine

## 2018-05-31 ENCOUNTER — Other Ambulatory Visit: Payer: Self-pay | Admitting: Family Medicine

## 2018-06-15 IMAGING — CT CT CHEST W/ CM
2 of 3 series · 15 of 36 positions shown, 18 images · IV contrast (iopamidol)
Comparison: 06/10/2017 and prior chest radiographs.

CLINICAL DATA: 71-year-old female with history of right lung cancer
and radiation therapy. Right paratracheal opacity on prior chest
radiographs.

EXAM:
CT CHEST WITH CONTRAST
TECHNIQUE: Multidetector CT imaging of the chest was performed during
intravenous contrast administration.
CONTRAST:  80mL RKNQK8-FJJ IOPAMIDOL (RKNQK8-FJJ) INJECTION 61%

[Series 2: axial st · axial · 0.66mm/px · z∈[-286,-48]mm · 12 of 141 slices shown, 15 images]
[im 11/141  mediastinal]
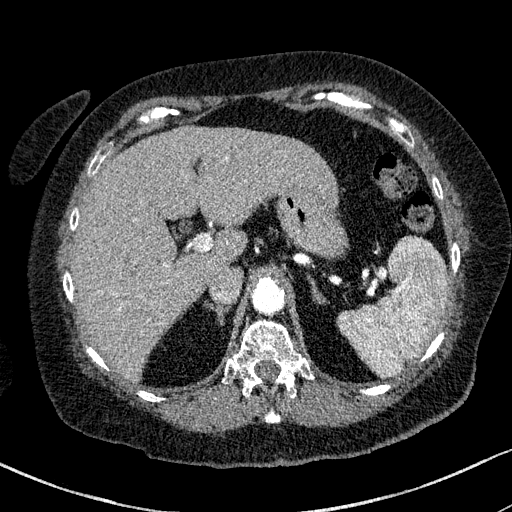
[im 11/141  lung]
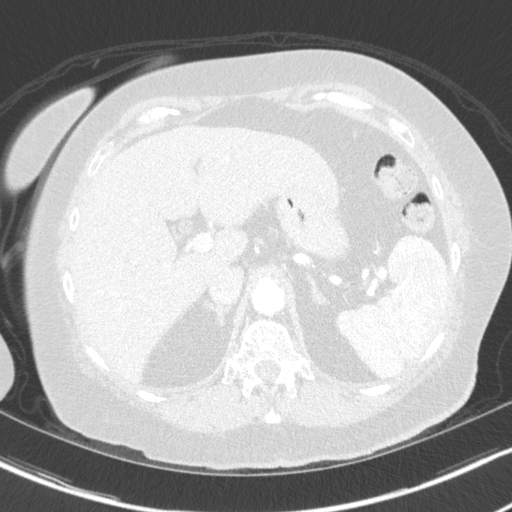
[im 21/141  lung]
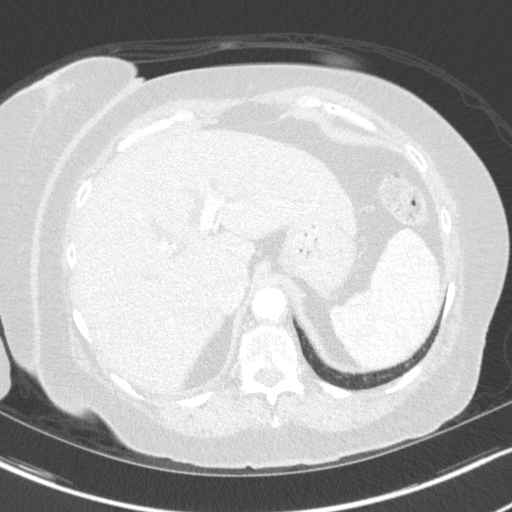
[im 32/141  lung]
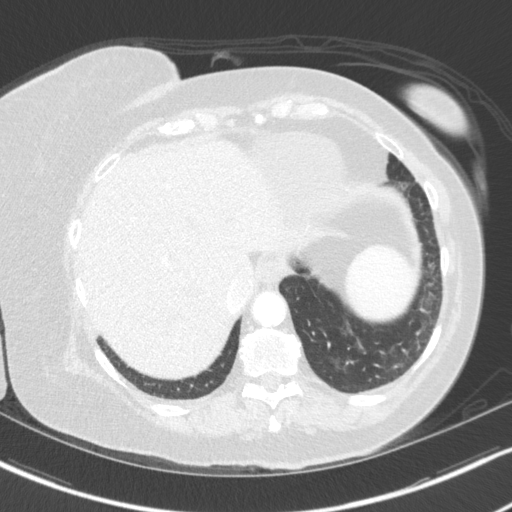
[im 42/141  lung]
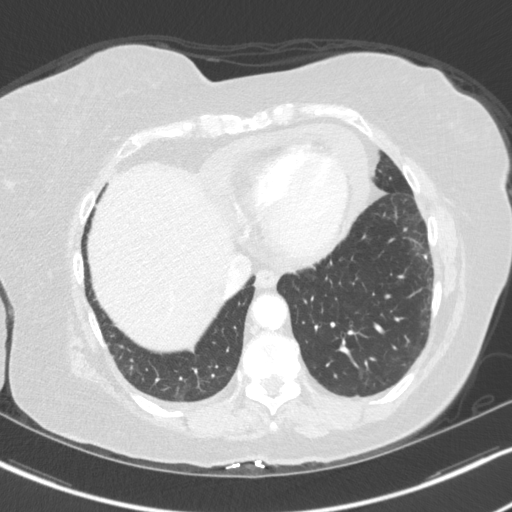
[im 52/141  mediastinal]
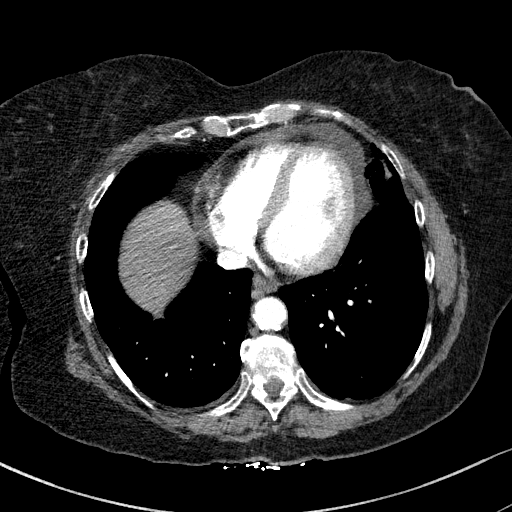
[im 52/141  lung]
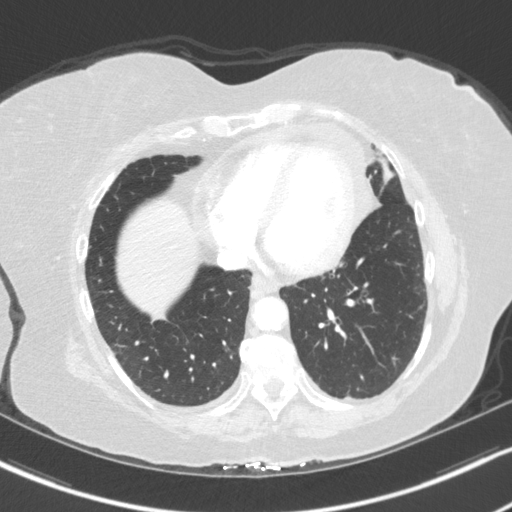
[im 63/141  lung]
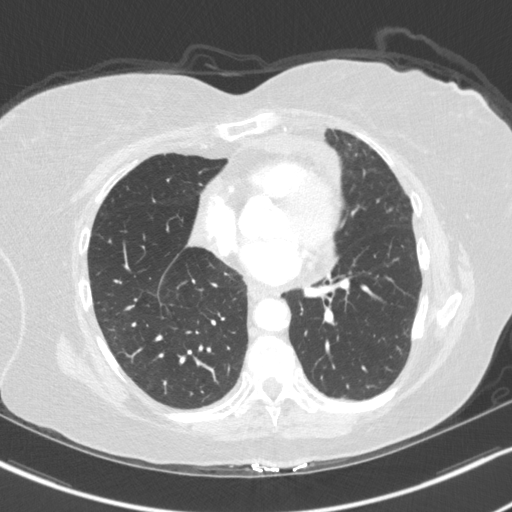
[im 78/141  lung]
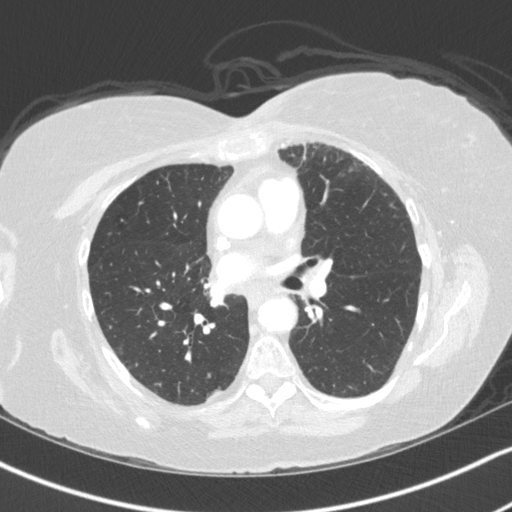
[im 89/141  lung]
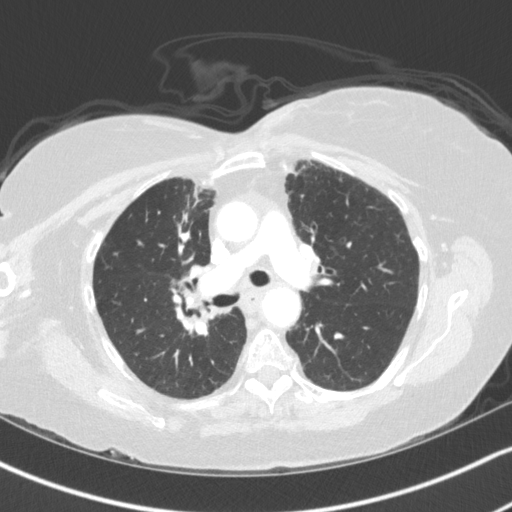
[im 99/141  mediastinal]
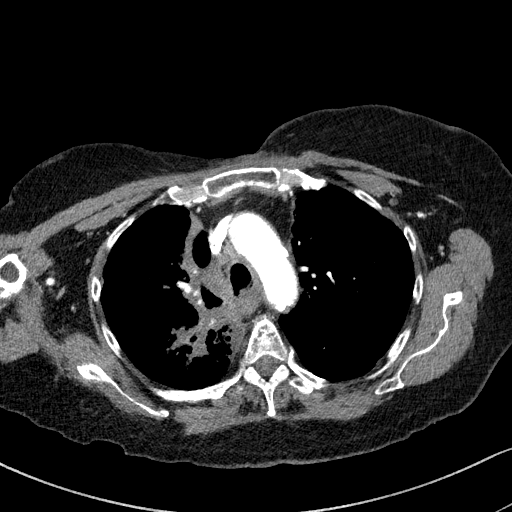
[im 99/141  lung]
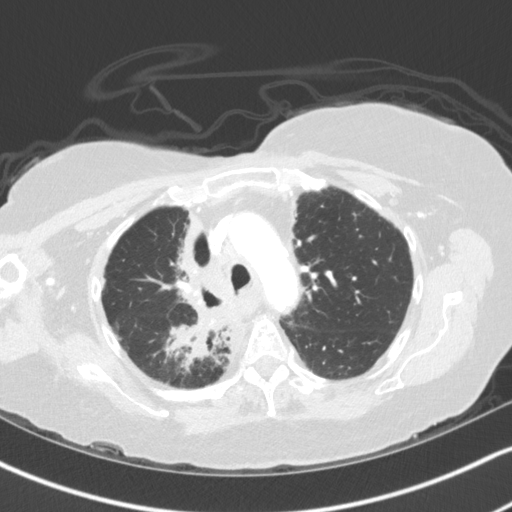
[im 109/141  lung]
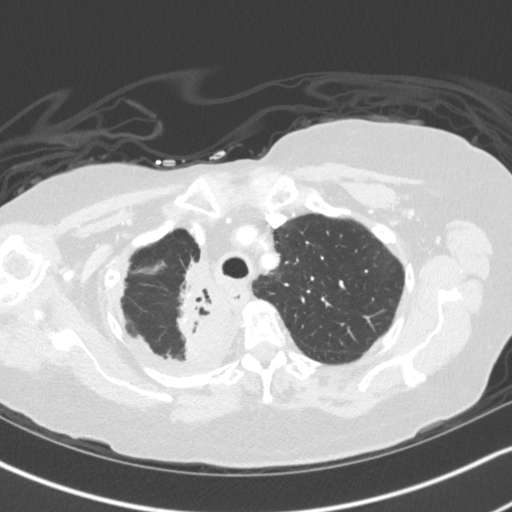
[im 120/141  lung]
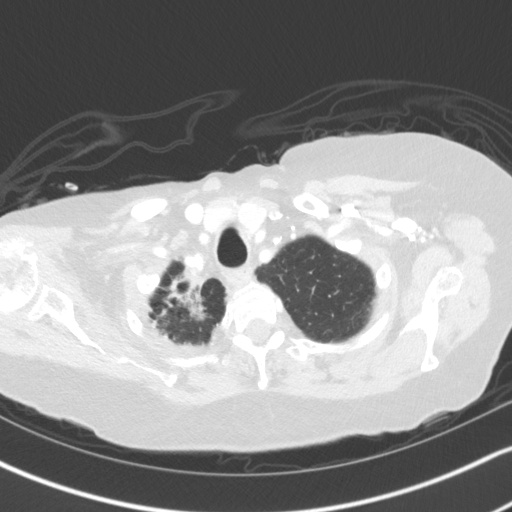
[im 130/141  lung]
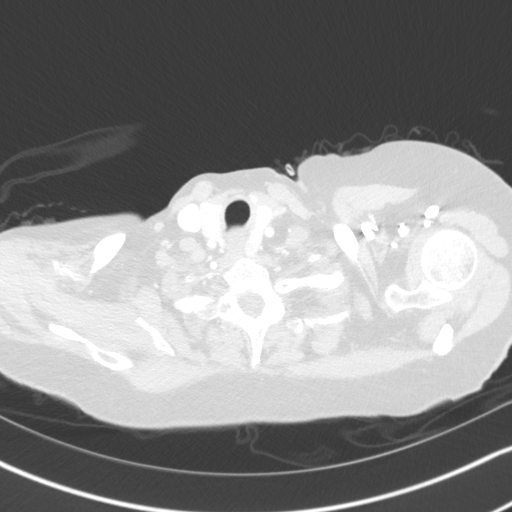

[Series 5: coronal · coronal · 0.59mm/px · 3 of 127 slices shown]
[im 26/127  lung]
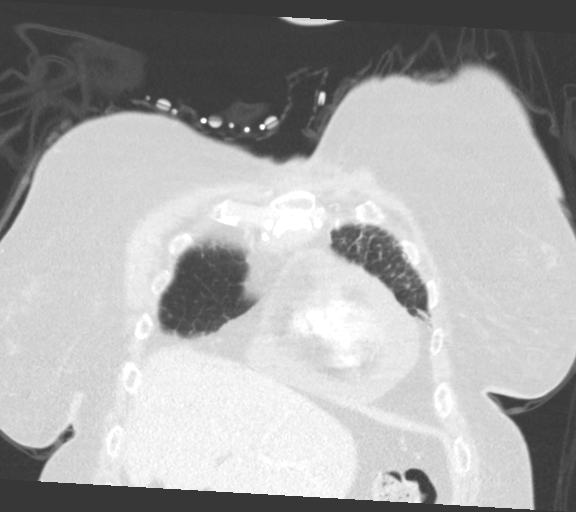
[im 51/127  lung]
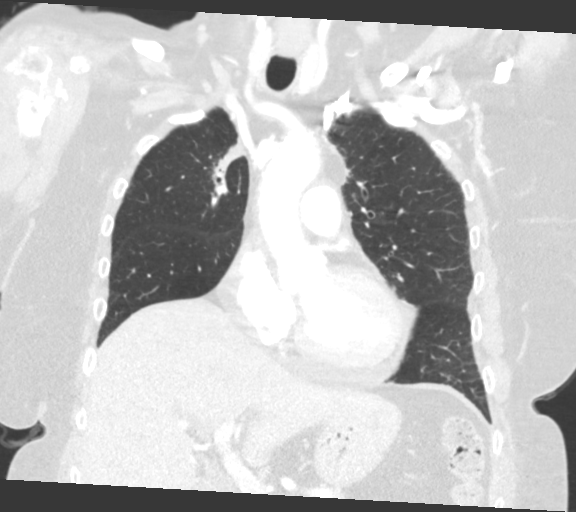
[im 76/127  lung]
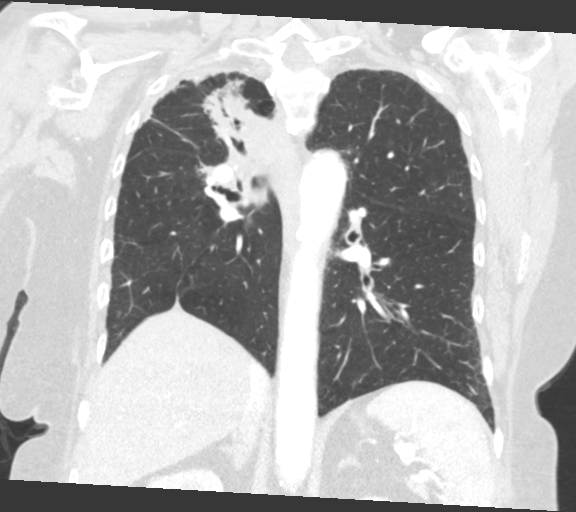

[15 of 36 positions shown; findings below may reference images not displayed]

FINDINGS: Cardiovascular: A moderate right pleural effusion has slightly
increased since 09/22/2016. Heart size normal. Thoracic aortic
atherosclerotic calcifications noted without aneurysm.

Mediastinum/Nodes: No enlarged lymph nodes identified. No
mediastinal mass or suspicious thyroid abnormalities.

Lungs/Pleura: Medial right upper lung and apical consolidation/soft
tissue is unchanged from 0878 and compatible with post
treatment/radiation changes. A tiny amount of adjacent pleural fluid
is again noted. No new pulmonary opacities are identified. There is
no evidence of suspicious nodule, airspace disease, consolidation,
left pleural effusion or pneumothorax.

Upper Abdomen: No acute abnormality

Musculoskeletal: A mixed sclerotic and lytic lesion within the
sternum is new since 09/22/2016 and worrisome for a bony metastasis.
A remote comminuted fracture of the proximal right humerus is again
identified. Within the sternum the is noted.
IMPRESSION: 1. New 2.5 cm sternal lesion since [DATE] worrisome for bony
metastasis.
2. Stable post treatment changes/scarring within the paramedian
right upper lung and apex.
3. Slight increase in moderate pericardial effusion since
09/22/2016.
[DATE]. No new or suspicious pulmonary opacities since [DATE].  Aortic Atherosclerosis ([id number]).

## 2018-07-27 ENCOUNTER — Other Ambulatory Visit: Payer: Self-pay | Admitting: *Deleted

## 2018-07-27 DIAGNOSIS — E118 Type 2 diabetes mellitus with unspecified complications: Secondary | ICD-10-CM

## 2018-07-27 DIAGNOSIS — Z794 Long term (current) use of insulin: Secondary | ICD-10-CM

## 2018-07-27 MED ORDER — INSULIN ISOPHANE & REGULAR (HUMAN 70-30)100 UNIT/ML KWIKPEN: PEN_INJECTOR | SUBCUTANEOUS | 1 refills | Status: DC

## 2018-08-06 ENCOUNTER — Other Ambulatory Visit: Payer: Self-pay | Admitting: Family Medicine

## 2018-08-10 ENCOUNTER — Ambulatory Visit (INDEPENDENT_AMBULATORY_CARE_PROVIDER_SITE_OTHER): Payer: Medicare Other | Admitting: Family Medicine

## 2018-08-10 ENCOUNTER — Encounter: Payer: Self-pay | Admitting: Family Medicine

## 2018-08-10 VITALS — BP 104/66 | HR 82 | Resp 16 | Ht 62.0 in | Wt 160.0 lb

## 2018-08-10 DIAGNOSIS — N183 Chronic kidney disease, stage 3 unspecified: Secondary | ICD-10-CM

## 2018-08-10 DIAGNOSIS — E118 Type 2 diabetes mellitus with unspecified complications: Secondary | ICD-10-CM | POA: Diagnosis not present

## 2018-08-10 DIAGNOSIS — I10 Essential (primary) hypertension: Secondary | ICD-10-CM | POA: Diagnosis not present

## 2018-08-10 DIAGNOSIS — F330 Major depressive disorder, recurrent, mild: Secondary | ICD-10-CM

## 2018-08-10 DIAGNOSIS — Z23 Encounter for immunization: Secondary | ICD-10-CM

## 2018-08-10 MED ORDER — ESCITALOPRAM OXALATE 20 MG PO TABS: 20.0000 mg | ORAL_TABLET | Freq: Every day | ORAL | 3 refills | Status: DC

## 2018-08-10 NOTE — Progress Notes (Signed)
OFFICE VISIT  08/10/2018   CC:  Chief Complaint  Patient presents with  . Follow-up    RCI   HPI:    Patient is a 72 y.o. Caucasian female who presents accompanied by her daughter for 3 mo f/u DM 2,CRI III (GFR 22s), and MDD.  DM:  Restarted low dose metformin last f/u visit plus increased base 70/30 insulin dose to 9 U qAM and & U qSupper. Pt forgot list of sugar readings today, but recalls avg gluc of 140s when she checks it qAM and qPM. No hypoglycemia.  CRI: she is not very good at hydration habits.  She does not take NSAIDs.  MDD: last f/u visit I increased her lexapro to 38m qd.  Still pretty irritable, snappy, acts grumpy. However, she denies feeling persistently sad or hopeless.  She does feel like she is a disappointment to others, has poor energy, gets out of breath easily, tends to overeat, tends to want to sleep/lie around and do nothing a lot. Admittedly, she and her husband live by themselves and do not drive.  They depend on their daughter for transportation to MD visits, grocery, etc. She has a driveway she can walk in.  Enjoys sitting on her porch swing.  No SI or HI.    Past Medical History:  Diagnosis Date  . Age-related nuclear cataract of both eyes 2016   +cortical age related cataracts OU   . Allergy    seasonal  . Arthritis    hands  . Bilateral diabetic retinopathy (HCC) 2015   Dr. MZigmund Daniel . Blood transfusion without reported diagnosis    when taking chemo  . Chronic diastolic heart failure (HCC)    Takotsubo CM (resolved @5 /2018):  a. 08/2016 Echo: EF 30-35%, gr1DD, PASP 313mg;  b. 11/2016 Echo: EF 40-45%, no rwma, nl RV fxn, mild TR, PASP 3724m.  . CKD (chronic kidney disease), stage II    GFR 60s  . 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  . CMarland KitchenPD (chronic obstructive pulmonary disease) ([place] . CVA (cerebral vascular accident) ([place]   MRI did show a left-sided small ischemic stroke which was  acute but likely incidental (MRI was done b/c pt had TIA sx's in hospital).  Neuro put her on plavix at that time.  Cardiology d/c'd her plavix 08/2017.  . Diabetes mellitus with complication (HCC)    diab retinopathy OU (laser)  . GERD (gastroesophageal reflux disease)    protonix  . History of diverticulitis of colon    with abscess; required IR percutaneous drain placed 09/2016.  . HMarland Kitchenperlipidemia   . Hypertension   . Left bundle branch block (LBBB) 12/16/2016  . Lung cancer ([place]  non-small cell lung ca, stage III in 05/2011; systemic chemotherapy concurrent with radiation followed by prophylactic cranial irradiation and has been observation since July of 2010 with no evidence for disease recurrence-released from onc f/u 12/2014 (needs annual cxr by PCP).  CXR 08/2015 stable.  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 MohEarlie Serverd bx and this was NEG for malignancy.  . Mild cognitive impairment with memory loss    Likely from brain radiation therapy  . Non-obstructive CAD (coronary artery disease)    a. Cath 2006 preserved LV fxn, scattered irregularities without critical stenosis; b. 2008 stress echo negative for ischemia, but with hypertensive response; c. 08/2016 Cath: D1 25%, otw nl.  . Normocytic anemia 2018   Iron, vit B12, folate all normal  12/2016.  Marland Kitchen Open-angle glaucoma 2016   Dr. Shirley Muscat, (bilateral)---responding to topical therapy  . Osteoporosis 03/2016; 03/2018   03/2016 DEXA T-score -2.2.  03/2018 T-score L femoral neck  -2.7--boniva started.  . Pneumonia    hospitalization 11/2016; hypoxemic resp failure--d/c'd home with home oxygen therapy  . Subclinical hyperthyroidism 2018  . Takotsubo cardiomyopathy    a. 08/2016 Echo: EF 30-35%, gr1DD, PASP 64mHg;  b. 08/2016 Cath: nonobs Dzs;  c. 11/2016 Echo: EF 40-45%, no rwma, nl RV fxn, mild TR, PASP 383mg.  ECHO 02/2017 EF 50-55%, grd II DD---essentially resolved Takotsubo 02/2017.  . [place] pointes ([place]11/[place]   a. 08/2016 in  setting of diverticulitis & pneumoperitoneum and Takotsubo CM -->prolonged QT, seen by EP-->avoid meds with potential for QT prolongation.    Past Surgical History:  Procedure Laterality Date  . ABDOMINAL HYSTERECTOMY  1997  . APPENDECTOMY    . CARDIAC CATHETERIZATION N/A 09/14/2016   Procedure: Left Heart Cath and Coronary Angiography;  Surgeon: ThTroy SineMD;  [facility];  Service: Cardiovascular; Mild nonobstructive CAD with 85% smooth narrowing in the first diagonal branch of the LAD; 10% smooth narrowing of the ostial proximal left circumflex coronary artery; and a normal dominant RCA.  . Marland KitchenARPAL TUNNEL RELEASE    . CATARACT EXTRACTION    . CHOLECYSTECTOMY  2002  . COLONOSCOPY  10/24/00   normal.  [employer] hemoccult testing via [place]/25/16 was NEG  . dexa  03/2016; 03/2018   03/2018 T-score L femoral neck  -2.7 (worsened compared to osteopenic range 2017.)  . IR GENERIC HISTORICAL  09/19/2016   IR SINUS/FIST TUBE CHK-NON GI 09/19/2016 JaCorrie MckusickDO [facility]  . IR GENERIC HISTORICAL  10/06/2016   IR RADIOLOGIST EVAL & MGMT 10/06/2016 [facility]  . IR GENERIC HISTORICAL  10/26/2016   IR RADIOLOGIST EVAL & MGMT 10/26/2016 [facility]  . IR GENERIC HISTORICAL  11/03/2016   IR RADIOLOGIST EVAL & MGMT 11/03/2016 WeArdis RowanPA-C [facility]  . IR GENERIC HISTORICAL  11/24/2016   IR RADIOLOGIST EVAL & MGMT 11/24/2016 WeArdis RowanPA-C [facility]  . IR GENERIC HISTORICAL  12/05/2016   Fistula smaller/improving.  IR SINUS/FIST TUBE CHK-NON GI 12/05/2016 JoSandi MariscalMD [facility]  . IR GENERIC HISTORICAL  12/22/2016   IR RADIOLOGIST EVAL & MGMT 12/22/2016 MiGreggory KeenMD [facility]  . IR RADIOLOGIST EVAL & MGMT  01/10/2017  . IR RADIOLOGIST EVAL & MGMT  02/09/2017  . IR SINUS/FIST TUBE CHK-NON GI  03/28/2017  . LEFT HEART CATHETERIZATION WITH CORONARY ANGIOGRAM N/A 05/30/2012   Procedure: LEFT HEART CATHETERIZATION WITH  CORONARY ANGIOGRAM;  Surgeon: ChBurnell BlanksMD;  [facility];  Service: Cardiovascular: Mild, non-obstructive CAD  . TRANSTHORACIC ECHOCARDIOGRAM  09/14/2016; 11/2016   08/2016: EF 30-35 %, Akinesis of the mid-apicalanteroseptal myocardium.  Grade I DD.  Mild pulm HTN.  Repeat echo 11/2016, EF 40-45%, no rwma, nl RV fxn, mild TR, PASP 3758m.    . TMarland KitchenANSTHORACIC ECHOCARDIOGRAM  03/07/2017   EF 50-55%. Hypokinesis of the distal septum with overall low normal LV,  systolic function; mild diastolic dysfunction with elevated LV  filling pressure; mildly calcified aortic valve with mild AI; small pericardial effusion    Outpatient Medications Prior to Visit  Medication Sig Dispense Refill  . acetaminophen (TYLENOL) 500 MG tablet Take 500-1,000 mg by mouth every 6 (six) hours as needed for headache (or  pain).     . albuterol (PROAIR HFA) 108 (90 Base) MCG/ACT inhaler Inhale 2 puffs into the lungs every 6 (six) hours as needed for wheezing or shortness of breath. 18 g 3  . albuterol (PROVENTIL) (2.5 MG/3ML) 0.083% nebulizer solution USE ONE VIAL IN NEBULIZER EVERY 4 HOURS AS NEEDED FOR WHEEZING OR SHORTNESS OF BREATH 75 mL 3  . aspirin 81 MG chewable tablet Chew 81 mg by mouth daily.    Marland Kitchen atorvastatin (LIPITOR) 80 MG tablet TAKE 1 TABLET BY MOUTH ONCE DAILY 90 tablet 1  . cetirizine (ZYRTEC) 10 MG tablet Take 10 mg by mouth daily.    . fluticasone (FLOVENT HFA) 220 MCG/ACT inhaler Inhale 2 puffs into the lungs 2 (two) times daily. 12 g 6  . furosemide (LASIX) 20 MG tablet Take 1 tablet (20 mg total) by mouth daily. 90 tablet 1  . ibandronate (BONIVA) 150 MG tablet Take 1 tablet (150 mg total) by mouth every 30 (thirty) days. Take in the morning with a full glass of water, on an empty stomach, and do not take anything else by mouth or lie down for the next 30 min. 3 tablet 3  . Insulin Isophane & Regular Human (HUMULIN 70/30 KWIKPEN) (70-30) 100 UNIT/ML PEN Take a base dose of 9 U SQ at  breakfast and 7 U SQ at evening meal + add additional units PER SLIDING SCALE EVERY MORNING AND EVENING 140-199 2UNITS, 200-250 4 UNITS, 251-299 6 UNITS, 300-349 8 UNITS, 350 OR ABOVE 10 UNITS 15 pen 1  . metFORMIN (GLUCOPHAGE) 500 MG tablet Take 1 tablet (500 mg total) by mouth 2 (two) times daily with a meal. 180 tablet 1  . metoprolol tartrate (LOPRESSOR) 25 MG tablet TAKE 1 TABLET BY MOUTH IN THE MORNING AND 2 AT BEDTIME.Please schedule appointment for refills. 270 tablet 0  . ONE TOUCH ULTRA TEST test strip USE  STRIP TO CHECK GLUCOSE THREE TIMES DAILY 100 each 11  . ONETOUCH DELICA LANCETS 63J MISC USE 1  TO CHECK GLUCOSE THREE TIMES DAILY 100 each 11  . pantoprazole (PROTONIX) 40 MG tablet TAKE 1 TABLET BY MOUTH ONCE DAILY 90 tablet 3  . potassium chloride (K-DUR) 10 MEQ tablet TAKE 1 TABLET BY MOUTH ONCE DAILY 90 tablet 1  . escitalopram (LEXAPRO) 10 MG tablet Take 1 tablet (10 mg total) by mouth daily. 90 tablet 1  . nitroGLYCERIN (NITROSTAT) 0.4 MG SL tablet Place 1 tablet (0.4 mg total) under the tongue every 5 (five) minutes as needed. For chest pain 50 tablet 3  . ONETOUCH DELICA LANCETS 49F MISC USE 1  TO CHECK GLUCOSE THREE TIMES DAILY 100 each 11   No facility-administered medications prior to visit.     Allergies  Allergen Reactions  . Lactose Intolerance (Gi) Diarrhea  . Ibuprofen Other (See Comments)    REACTION: Anxiousness, hyperventilates    ROS As per HPI  PE: Blood pressure 104/66, pulse 82, resp. rate 16, height 5' 2"  (1.575 m), weight 160 lb (72.6 kg), SpO2 95 %. Gen: Alert, well appearing.  Patient is oriented to person, place, time, and situation. AFFECT: pleasant, lucid thought and speech. CV: RRR, no m/r/g.   LUNGS: CTA bilat, nonlabored resps, mildly diminished breath sounds diffusely. EXT: no clubbing or cyanosis.  no edema.  Foot exam - L foot great toe with a mild amount of pinkish discoloration around medial nail border but she is not tender, and her  sensation is intact.  She has a subungual  hematoma, but no tenderness with palpation of the toenail.  The toenail does appear ingrowing in it's medial edge.  No ulceration.  Color and temperature is normal. Sensation is intact. Peripheral pulses are palpable. Toenails are normal other than the L big toenail as above.  LABS:  Lab Results  Component Value Date   TSH 1.24 04/20/2017   Lab Results  Component Value Date   WBC 7.2 09/12/2017   HGB 11.7 (L) 09/12/2017   HCT 36.6 09/12/2017   MCV 86.9 09/12/2017   PLT 184 09/12/2017   Lab Results  Component Value Date   CREATININE 1.08 05/10/2018   BUN 13 05/10/2018   NA 137 05/10/2018   K 4.4 05/10/2018   CL 99 05/10/2018   CO2 30 05/10/2018  GFR 05/10/18= 53 ml/min  Lab Results  Component Value Date   ALT 10 04/20/2017   AST 14 04/20/2017   ALKPHOS 99 04/20/2017   BILITOT 0.3 04/20/2017   Lab Results  Component Value Date   CHOL 65 12/02/2016   Lab Results  Component Value Date   HDL 34 (L) 12/02/2016   Lab Results  Component Value Date   LDLCALC 23 12/02/2016   Lab Results  Component Value Date   TRIG 42 12/02/2016   Lab Results  Component Value Date   CHOLHDL 1.9 12/02/2016   Lab Results  Component Value Date   HGBA1C 9.3 (H) 05/10/2018    IMPRESSION AND PLAN:  1) DM 2: per pt's recollection of her glucoses, her control is much improved. Check HbA1c. Feet exam was good except some discomfort in her IP joint of L big toe. I see no sign of infection.  She did stub her toe a few months ago.  I think she has some arthritis in IP joint. Urine microalb/cr today. Flu vaccine today.  2) CRI III: encouraged good hydration.  She avoids NSAIDs.   Monitor lytes/cr today. As long as GFR stays above 45 ml/min I'll leave her on metformin.  3) MDD, recurrent: with her memory impairment it is sometimes difficult to interpret her symptoms/behavior. Plus, she has an environment that is not very stimulating, so a poor  mood could be expected more often in anyone in that situation. At any rate, she tolerated the increase in lexapro to 10 mg qd well, so I feel like increasing it to 20 mg qd today is reasonable.  An After Visit Summary was printed and given to the patient.  FOLLOW UP: Return in about 3 months (around 11/10/2018) for routine chronic illness f/u.  Signed:  Crissie Sickles, MD           08/10/2018

## 2018-08-11 LAB — COMPREHENSIVE METABOLIC PANEL
AG Ratio: 1.6 (calc) (ref 1.0–2.5)
ALT: 19 U/L (ref 6–29)
AST: 20 U/L (ref 10–35)
Albumin: 4.3 g/dL (ref 3.6–5.1)
Alkaline phosphatase (APISO): 81 U/L (ref 33–130)
BILIRUBIN TOTAL: 0.4 mg/dL (ref 0.2–1.2)
BUN/Creatinine Ratio: 16 (calc) (ref 6–22)
BUN: 16 mg/dL (ref 7–25)
CALCIUM: 9.2 mg/dL (ref 8.6–10.4)
CHLORIDE: 100 mmol/L (ref 98–110)
CO2: 27 mmol/L (ref 20–32)
Creat: 1.01 mg/dL — ABNORMAL HIGH (ref 0.60–0.93)
Globulin: 2.7 g/dL (calc) (ref 1.9–3.7)
Glucose, Bld: 181 mg/dL — ABNORMAL HIGH (ref 65–99)
Potassium: 4.2 mmol/L (ref 3.5–5.3)
Sodium: 138 mmol/L (ref 135–146)
TOTAL PROTEIN: 7 g/dL (ref 6.1–8.1)

## 2018-08-11 LAB — MICROALBUMIN / CREATININE URINE RATIO
Creatinine, Urine: 47 mg/dL (ref 20–275)
MICROALB UR: 0.4 mg/dL
MICROALB/CREAT RATIO: 9 ug/mg{creat} (ref <30)

## 2018-08-11 LAB — HEMOGLOBIN A1C
Hgb A1c MFr Bld: 7.3 % of total Hgb — ABNORMAL HIGH (ref <5.7)
Mean Plasma Glucose: 163 (calc)
eAG (mmol/L): 9 (calc)

## 2018-08-19 ENCOUNTER — Other Ambulatory Visit: Payer: Self-pay | Admitting: Family Medicine

## 2018-08-23 ENCOUNTER — Encounter (HOSPITAL_COMMUNITY): Payer: Self-pay | Admitting: Emergency Medicine

## 2018-08-23 ENCOUNTER — Other Ambulatory Visit: Payer: Self-pay

## 2018-08-23 ENCOUNTER — Emergency Department (HOSPITAL_COMMUNITY)
Admission: EM | Admit: 2018-08-23 | Discharge: 2018-08-24 | Disposition: A | Discharge status: Home / Self Care | Payer: Medicare Other | Attending: Emergency Medicine | Admitting: Emergency Medicine

## 2018-08-23 DIAGNOSIS — I2510 Atherosclerotic heart disease of native coronary artery without angina pectoris: Secondary | ICD-10-CM | POA: Insufficient documentation

## 2018-08-23 DIAGNOSIS — R109 Unspecified abdominal pain: Secondary | ICD-10-CM | POA: Diagnosis present

## 2018-08-23 DIAGNOSIS — N182 Chronic kidney disease, stage 2 (mild): Secondary | ICD-10-CM | POA: Insufficient documentation

## 2018-08-23 DIAGNOSIS — G3184 Mild cognitive impairment, so stated: Secondary | ICD-10-CM | POA: Diagnosis not present

## 2018-08-23 DIAGNOSIS — J449 Chronic obstructive pulmonary disease, unspecified: Secondary | ICD-10-CM | POA: Diagnosis not present

## 2018-08-23 DIAGNOSIS — R1013 Epigastric pain: Secondary | ICD-10-CM | POA: Diagnosis not present

## 2018-08-23 DIAGNOSIS — I5032 Chronic diastolic (congestive) heart failure: Secondary | ICD-10-CM | POA: Insufficient documentation

## 2018-08-23 DIAGNOSIS — I130 Hypertensive heart and chronic kidney disease with heart failure and stage 1 through stage 4 chronic kidney disease, or unspecified chronic kidney disease: Secondary | ICD-10-CM | POA: Insufficient documentation

## 2018-08-23 DIAGNOSIS — Z79899 Other long term (current) drug therapy: Secondary | ICD-10-CM | POA: Diagnosis not present

## 2018-08-23 DIAGNOSIS — Z87891 Personal history of nicotine dependence: Secondary | ICD-10-CM | POA: Diagnosis not present

## 2018-08-23 DIAGNOSIS — N3000 Acute cystitis without hematuria: Secondary | ICD-10-CM | POA: Diagnosis not present

## 2018-08-23 DIAGNOSIS — E1122 Type 2 diabetes mellitus with diabetic chronic kidney disease: Secondary | ICD-10-CM | POA: Diagnosis not present

## 2018-08-23 DIAGNOSIS — Z7982 Long term (current) use of aspirin: Secondary | ICD-10-CM | POA: Insufficient documentation

## 2018-08-23 DIAGNOSIS — K5730 Diverticulosis of large intestine without perforation or abscess without bleeding: Secondary | ICD-10-CM | POA: Diagnosis not present

## 2018-08-23 DIAGNOSIS — Z794 Long term (current) use of insulin: Secondary | ICD-10-CM | POA: Insufficient documentation

## 2018-08-23 LAB — CBC
HEMATOCRIT: 39.9 % (ref 36.0–46.0)
Hemoglobin: 11.9 g/dL — ABNORMAL LOW (ref 12.0–15.0)
MCH: 27.7 pg (ref 26.0–34.0)
MCHC: 29.8 g/dL — AB (ref 30.0–36.0)
MCV: 93 fL (ref 80.0–100.0)
Platelets: 269 10*3/uL (ref 150–400)
RBC: 4.29 MIL/uL (ref 3.87–5.11)
RDW: 15.3 % (ref 11.5–15.5)
WBC: 10.2 10*3/uL (ref 4.0–10.5)
nRBC: 0 % (ref 0.0–0.2)

## 2018-08-23 LAB — URINALYSIS, ROUTINE W REFLEX MICROSCOPIC
Bilirubin Urine: NEGATIVE
Glucose, UA: NEGATIVE mg/dL
HGB URINE DIPSTICK: NEGATIVE
Ketones, ur: NEGATIVE mg/dL
NITRITE: NEGATIVE
Protein, ur: NEGATIVE mg/dL
SPECIFIC GRAVITY, URINE: 1.012 (ref 1.005–1.030)
pH: 6 (ref 5.0–8.0)

## 2018-08-23 LAB — COMPREHENSIVE METABOLIC PANEL
ALK PHOS: 82 U/L (ref 38–126)
ALT: 31 U/L (ref 0–44)
AST: 39 U/L (ref 15–41)
Albumin: 3.9 g/dL (ref 3.5–5.0)
Anion gap: 11 (ref 5–15)
BUN: 13 mg/dL (ref 8–23)
CHLORIDE: 102 mmol/L (ref 98–111)
CO2: 23 mmol/L (ref 22–32)
CREATININE: 1.07 mg/dL — AB (ref 0.44–1.00)
Calcium: 8.8 mg/dL — ABNORMAL LOW (ref 8.9–10.3)
GFR calc Af Amer: 60 mL/min — ABNORMAL LOW (ref >60)
GFR, EST NON AFRICAN AMERICAN: 51 mL/min — AB (ref >60)
Glucose, Bld: 106 mg/dL — ABNORMAL HIGH (ref 70–99)
Potassium: 4.5 mmol/L (ref 3.5–5.1)
Sodium: 136 mmol/L (ref 135–145)
Total Bilirubin: 0.6 mg/dL (ref 0.3–1.2)
Total Protein: 7.3 g/dL (ref 6.5–8.1)

## 2018-08-23 LAB — CBG MONITORING, ED: GLUCOSE-CAPILLARY: 97 mg/dL (ref 70–99)

## 2018-08-23 LAB — LIPASE, BLOOD: Lipase: 33 U/L (ref 11–51)

## 2018-08-23 NOTE — ED Notes (Signed)
Micro notified for urine culture add on.

## 2018-08-23 NOTE — ED Provider Notes (Signed)
[facility] EMERGENCY DEPARTMENT Provider Note   CSN: [id number] Arrival date & time: 08/23/18  1631     History   Chief Complaint Chief Complaint  Patient presents with  . Abdominal Pain    HPI [name] is a 72 y.o. female.  72 year old female with history of diabetes, COPD, CAD, dyslipidemia, NSCLC, HTN, GERD, CKD presents to the emergency department for evaluation of left upper quadrant and epigastric abdominal pain.  She has had symptoms for 1 day.  She describes a pressure-like sensation which is intermittent.  She does not know any modifying factors of her symptoms.  She does feel slightly short of breath as well as clammy when her pain is present.  She denies taking any medications for her symptoms prior to arrival.  Patient also reporting diarrhea x2 days.  Diarrhea has been nonbloody.  She has not had any nausea or vomiting, fevers, urinary frequency or urgency.  Triage note reports no dysuria, though patient does states she has noted mild dysuria recently.  She has not had any recent antibiotic use.  Abdominal surgical history significant for hysterectomy, appendectomy, cholecystectomy.  Last BM was earlier today.     Past Medical History:  Diagnosis Date  . Age-related nuclear cataract of both eyes 2016   +cortical age related cataracts OU   . Allergy    seasonal  . Arthritis    hands  . Bilateral diabetic retinopathy (HCC) 2015   Dr. Zigmund Daniel  . Blood transfusion without reported diagnosis    when taking chemo  . Chronic diastolic heart failure (HCC)    Takotsubo CM (resolved _0 /2018):  a. 08/2016 Echo: EF 30-35%, gr1DD, PASP 21mHg;  b. 11/2016 Echo: EF 40-45%, no rwma, nl RV fxn, mild TR, PASP 383mg.  . CKD (chronic kidney disease), stage II    GFR 60s  . 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  . Marland KitchenOPD (chronic obstructive pulmonary disease) (HCClarkrange  . CVA (cerebral vascular accident) (HCRoe 09/2016   MRI did show a left-sided small ischemic stroke which was acute but likely incidental (MRI was done b/c pt had TIA sx's in hospital).  Neuro put her on plavix at that time.  Cardiology d/c'd her plavix 08/2017.  . Diabetes mellitus with complication (HCC)    diab retinopathy OU (laser)  . GERD (gastroesophageal reflux disease)    protonix  . History of diverticulitis of colon    with abscess; required IR percutaneous drain placed 09/2016.  . Marland Kitchenyperlipidemia   . Hypertension   . Left bundle branch block (LBBB) 12/16/2016  . Lung cancer (HCJohnsonville   non-small cell lung ca, stage III in 05/2011; systemic chemotherapy concurrent with radiation followed by prophylactic cranial irradiation and has been observation since July of 2010 with no evidence for disease recurrence-released from onc f/u 12/2014 (needs annual cxr by PCP).  CXR 08/2015 stable.  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 MoEarlie Serverid bx and this was NEG for malignancy.  . Mild cognitive impairment with memory loss    Likely from brain radiation therapy  . Non-obstructive CAD (coronary artery disease)    a. Cath 2006 preserved LV fxn, scattered irregularities without critical stenosis; b. 2008 stress echo negative for ischemia, but with hypertensive response; c. 08/2016 Cath: D1 25%, otw nl.  . Normocytic anemia 2018   Iron, vit B12, folate all normal 12/2016.  . Marland Kitchenpen-angle glaucoma 2016   Dr. BeShirley Muscat(bilateral)---responding to topical therapy  .  Osteoporosis 03/2016; 03/2018   03/2016 DEXA T-score -2.2.  03/2018 T-score L femoral neck  -2.7--boniva started.  . Pneumonia    hospitalization 11/2016; hypoxemic resp failure--d/c'd home with home oxygen therapy  . Subclinical hyperthyroidism 2018  . Takotsubo cardiomyopathy    a. 08/2016 Echo: EF 30-35%, gr1DD, PASP 28mHg;  b. 08/2016 Cath: nonobs Dzs;  c. 11/2016 Echo: EF 40-45%, no rwma, nl RV fxn, mild TR, PASP 314mg.  ECHO 02/2017 EF 50-55%, grd II DD---essentially resolved  Takotsubo 02/2017.  . [place]orsades de pointes (HCLos Altos11/2017   a. 08/2016 in setting of diverticulitis & pneumoperitoneum and Takotsubo CM -->prolonged QT, seen by EP-->avoid meds with potential for QT prolongation.    Patient Active Problem List   Diagnosis Date Noted  . Preoperative cardiovascular examination 01/12/2017  . Diarrhea 12/02/2016  . Acute respiratory failure with hypoxia (HCRocky Boy West02/06/2017  . CAP (community acquired pneumonia) 12/01/2016  . Essential hypertension 12/01/2016  . Acute encephalopathy 12/01/2016  . Chronic diastolic (congestive) heart failure (HCNassawadox02/05/2017  . Cerebrovascular accident (CVA) (HCHartland  . Hypokalemia 09/17/2016  . Hypertension associated with chronic kidney disease due to type 2 diabetes mellitus (HCElwood11/25/2017  . Cardiac arrest with ventricular fibrillation (HCBolivia11/22/2017  . Takotsubo cardiomyopathy 09/14/2016  . Colonic diverticular abscess   . Acute respiratory failure (HCDahlen  . Diverticulitis of large intestine with perforation and abscess 09/11/2016  . Debilitated patient 07/08/2016  . Arterial hypotension   . Controlled type 2 diabetes mellitus with hyperglycemia, with long-term current use of insulin (HCBrownstown08/22/2017  . Hyperlipidemia 06/14/2016  . COPD exacerbation (HCDarrtown12/26/2014  . Non-occlusive coronary artery disease 06/06/2012  . Allergic rhinitis 01/06/2011  . TOBACCO USE, QUIT 07/29/2009  . Uncontrolled type 2 diabetes mellitus with complication (HCRail Road [id number]. GERD (gastroesophageal reflux disease) 04/24/2007    Past Surgical History:  Procedure Laterality Date  . ABDOMINAL HYSTERECTOMY  1997  . APPENDECTOMY    . CARDIAC CATHETERIZATION N/A 09/14/2016   Procedure: Left Heart Cath and Coronary Angiography;  Surgeon: ThTroy SineMD;  [facility];  Service: Cardiovascular; Mild nonobstructive CAD with 85% smooth narrowing in the first diagonal branch of the LAD; 10% smooth narrowing of the ostial  proximal left circumflex coronary artery; and a normal dominant RCA.  . Marland KitchenARPAL TUNNEL RELEASE    . CATARACT EXTRACTION    . CHOLECYSTECTOMY  2002  . COLONOSCOPY  10/24/00   normal.  [employer] hemoccult testing via [place]/25/16 was NEG  . dexa  03/2016; 03/2018   03/2018 T-score L femoral neck  -2.7 (worsened compared to osteopenic range 2017.)  . IR GENERIC HISTORICAL  09/19/2016   IR SINUS/FIST TUBE CHK-NON GI 09/19/2016 JaCorrie MckusickDO [facility]  . IR GENERIC HISTORICAL  10/06/2016   IR RADIOLOGIST EVAL & MGMT 10/06/2016 [facility]  . IR GENERIC HISTORICAL  10/26/2016   IR RADIOLOGIST EVAL & MGMT 10/26/2016 [facility]  . IR GENERIC HISTORICAL  11/03/2016   IR RADIOLOGIST EVAL & MGMT 11/03/2016 WeArdis RowanPA-C [facility]  . IR GENERIC HISTORICAL  11/24/2016   IR RADIOLOGIST EVAL & MGMT 11/24/2016 WeArdis RowanPA-C [facility]  . IR GENERIC HISTORICAL  12/05/2016   Fistula smaller/improving.  IR SINUS/FIST TUBE CHK-NON GI 12/05/2016 JoSandi MariscalMD [facility]  . IR GENERIC HISTORICAL  12/22/2016   IR RADIOLOGIST EVAL & MGMT 12/22/2016 MiGreggory KeenMD [facility]  . IR  RADIOLOGIST EVAL & MGMT  01/10/2017  . IR RADIOLOGIST EVAL & MGMT  02/09/2017  . IR SINUS/FIST TUBE CHK-NON GI  03/28/2017  . LEFT HEART CATHETERIZATION WITH CORONARY ANGIOGRAM N/A 05/30/2012   Procedure: LEFT HEART CATHETERIZATION WITH CORONARY ANGIOGRAM;  Surgeon: Burnell Blanks, MD;  Location: [facility] C[facility] LAB;  Service: Cardiovascular: Mild, non-obstructive CAD  . TRANSTHORACIC ECHOCARDIOGRAM  09/14/2016; 11/2016   08/2016: EF 30-35 %, Akinesis of the mid-apicalanteroseptal myocardium.  Grade I DD.  Mild pulm HTN.  Repeat echo 11/2016, EF 40-45%, no rwma, nl RV fxn, mild TR, PASP 58mHg.    .Marland KitchenTRANSTHORACIC ECHOCARDIOGRAM  03/07/2017   EF 50-55%. Hypokinesis of the distal septum with overall low normal LV,  systolic function; mild diastolic dysfunction with elevated LV  filling  pressure; mildly calcified aortic valve with mild AI; small pericardial effusion     OB History   None      Home Medications    Prior to Admission medications   Medication Sig Start Date End Date Taking? Authorizing Provider  acetaminophen (TYLENOL) 500 MG tablet Take 500-1,000 mg by mouth every 6 (six) hours as needed for headache (or pain).    Yes [provider]  albuterol (PROAIR HFA) 108 (90 Base) MCG/ACT inhaler Inhale 2 puffs into the lungs every 6 (six) hours as needed for wheezing or shortness of breath. 02/16/17  Yes McGowen, PAdrian Blackwater MD  albuterol (PROVENTIL) (2.5 MG/3ML) 0.083% nebulizer solution USE ONE VIAL IN NEBULIZER EVERY 4 HOURS AS NEEDED FOR WHEEZING OR SHORTNESS OF BREATH Patient taking differently: Take 2.5 mg by nebulization every 4 (four) hours as needed for wheezing or shortness of breath.  05/05/17  Yes McGowen, PAdrian Blackwater MD  aspirin 81 MG chewable tablet Chew 81 mg by mouth daily.   Yes [provider]  atorvastatin (LIPITOR) 80 MG tablet TAKE 1 TABLET BY MOUTH ONCE DAILY Patient taking differently: Take 80 mg by mouth daily at 6 PM.  05/04/18  Yes McGowen, PAdrian Blackwater MD  cetirizine (ZYRTEC) 10 MG tablet Take 10 mg by mouth daily.   Yes [provider]  escitalopram (LEXAPRO) 20 MG tablet Take 1 tablet (20 mg total) by mouth daily. 08/10/18  Yes McGowen, PAdrian Blackwater MD  fluticasone (FLOVENT HFA) 220 MCG/ACT inhaler Inhale 2 puffs into the lungs 2 (two) times daily. 02/16/17  Yes McGowen, PAdrian Blackwater MD  furosemide (LASIX) 20 MG tablet TAKE 1 TABLET BY MOUTH ONCE DAILY Patient taking differently: Take 20 mg by mouth daily.  08/20/18  Yes McGowen, PAdrian Blackwater MD  ibandronate (BONIVA) 150 MG tablet Take 1 tablet (150 mg total) by mouth every 30 (thirty) days. Take in the morning with a full glass of water, on an empty stomach, and do not take anything else by mouth or lie down for the next 30 min. 04/13/18  Yes McGowen, PAdrian Blackwater MD  Insulin Isophane  & Regular Human (HUMULIN 70/30 KWIKPEN) (70-30) 100 UNIT/ML PEN Take a base dose of 9 U SQ at breakfast and 7 U SQ at evening meal + add additional units PER SLIDING SCALE EVERY MORNING AND EVENING 140-199 2UNITS, 200-250 4 UNITS, 251-299 6 UNITS, 300-349 8 UNITS, 350 OR ABOVE 10 UNITS Patient taking differently: Inject 7-19 Units into the skin See admin instructions. Take a base dose of 9 U SQ at breakfast and 7 U SQ at evening meal + add additional units PER SLIDING SCALE EVERY MORNING AND EVENING 140-199 2UNITS, 200-250 4 UNITS, 251-299 6 UNITS,  300-349 8 UNITS, 350 OR ABOVE 10 UNITS 07/27/18  Yes McGowen, [name], MD  metFORMIN (GLUCOPHAGE) 500 MG tablet Take 1 tablet (500 mg total) by mouth 2 (two) times daily with a meal. 05/10/18  Yes McGowen, [name], MD  metoprolol tartrate (LOPRESSOR) 25 MG tablet TAKE 1 TABLET BY MOUTH IN THE MORNING AND 2 AT BEDTIME.Please schedule appointment for refills. Patient taking differently: Take 25-50 mg by mouth See admin instructions. TAKE 1 TABLET BY MOUTH IN THE MORNING AND 2 AT BEDTIME.Please schedule appointment for refills. 04/16/18  Yes Leonie Man, MD  pantoprazole (PROTONIX) 40 MG tablet TAKE 1 TABLET BY MOUTH ONCE DAILY Patient taking differently: Take 40 mg by mouth daily.  08/06/18  Yes McGowen, [name], MD  potassium chloride (K-DUR) 10 MEQ tablet TAKE 1 TABLET BY MOUTH ONCE DAILY Patient taking differently: Take 10 mEq by mouth daily.  08/06/18  Yes McGowen, [name], MD  cephALEXin (KEFLEX) 500 MG capsule Take 1 capsule (500 mg total) by mouth 2 (two) times daily. 08/24/18   Antonietta Breach, PA-C  ONE TOUCH ULTRA TEST test strip USE  STRIP TO CHECK GLUCOSE THREE TIMES DAILY 05/31/18   McGowen, [name], MD  [facility] DELICA LANCETS 40J MISC USE 1  TO CHECK GLUCOSE THREE TIMES DAILY 05/04/18   McGowen, [name], MD    Family History Family History  Problem Relation Age of Onset  . Heart failure Mother   . Kidney disease Mother        renal failure   . Diabetes Mother   . Diabetes Father   . Diabetes Brother   . Heart disease Brother   . Heart attack Brother   . Heart attack Brother   . Heart attack Brother   . Colon cancer Neg Hx     Social History Social History   Tobacco Use  . Smoking status: Former Smoker    Packs/day: 1.00    Years: 45.00    Pack years: 45.00    Types: Cigarettes    Last attempt to quit: 10/24/2008    Years since quitting: 9.8  . Smokeless tobacco: Never Used  Substance Use Topics  . Alcohol use: No    Alcohol/week: 0.0 standard drinks  . Drug use: No     Allergies   Lactose intolerance (gi) and Ibuprofen   Review of Systems Review of Systems Ten systems reviewed and are negative for acute change, except as noted in the HPI.    Physical Exam Updated Vital Signs BP (!) 120/56   Pulse 85   Temp 97.7 F (36.5 C) (Oral)   Resp 18   SpO2 91%   Physical Exam  Constitutional: She is oriented to person, place, and time. She appears well-developed and well-nourished. No distress.  Resting comfortably, in NAD  HENT:  Head: Normocephalic and atraumatic.  Eyes: Conjunctivae and EOM are normal. No scleral icterus.  Neck: Normal range of motion.  Cardiovascular: Normal rate, regular rhythm and intact distal pulses.  Pulmonary/Chest: Effort normal. No stridor. No respiratory distress. She has no wheezes. She has no rales.  Lungs CTAB. Respirations even and unlabored.  Abdominal: She exhibits no mass. There is tenderness. There is no guarding.  Soft abdomen without peritoneal signs. There is some focal epigastric TTP. No masses or guarding.  Musculoskeletal: Normal range of motion.  Neurological: She is alert and oriented to person, place, and time. She exhibits normal muscle tone. Coordination normal.  Skin: Skin is warm and dry.  No rash noted. She is not diaphoretic. No erythema. No pallor.  Psychiatric: She has a normal mood and affect. Her behavior is normal.  Nursing note and vitals  reviewed.    ED Treatments / Results  Labs (all labs ordered are listed, but only abnormal results are displayed) Labs Reviewed  URINE CULTURE - Abnormal; Notable for the following components:      Result Value   Culture 60,000 COLONIES/mL ESCHERICHIA COLI (*)    Organism ID, Bacteria ESCHERICHIA COLI (*)    All other components within normal limits  COMPREHENSIVE METABOLIC PANEL - Abnormal; Notable for the following components:   Glucose, Bld 106 (*)    Creatinine, Ser 1.07 (*)    Calcium 8.8 (*)    GFR calc non Af Amer 51 (*)    GFR calc Af Amer 60 (*)    All other components within normal limits  CBC - Abnormal; Notable for the following components:   Hemoglobin 11.9 (*)    MCHC 29.8 (*)    All other components within normal limits  URINALYSIS, ROUTINE W REFLEX MICROSCOPIC - Abnormal; Notable for the following components:   APPearance HAZY (*)    Leukocytes, UA SMALL (*)    Bacteria, UA MANY (*)    All other components within normal limits  LIPASE, BLOOD  CBG MONITORING, ED  I-STAT TROPONIN, ED    EKG EKG Interpretation  Date/Time:  Thursday August 23 2018 17:11:54 EDT Ventricular Rate:  85 PR Interval:  166 QRS Duration: 124 QT Interval:  462 QTC Calculation: 549 R Axis:   174 Text Interpretation:  ** Suspect arm lead reversal, interpretation assumes no reversal Normal sinus rhythm Inferior infarct , age undetermined Anterolateral infarct , age undetermined Abnormal ECG No significant change since last tracing Confirmed by Orpah Greek [phone]) on 08/24/2018 5:03:32 AM   Radiology Ct Abdomen Pelvis W Contrast  Result Date: 08/24/2018 CLINICAL DATA:  71 year old female with abdominal pain. EXAM: CT ABDOMEN AND PELVIS WITH CONTRAST TECHNIQUE: Multidetector CT imaging of the abdomen and pelvis was performed using the standard protocol following bolus administration of intravenous contrast. CONTRAST:  164m OMNIPAQUE IOHEXOL 300 MG/ML  SOLN COMPARISON:   Abdominal radiograph dated 08/24/2018 and CT dated 12/02/2016 FINDINGS: Lower chest: Minimal bibasilar subpleural atelectasis/scarring. The visualized lungs otherwise clear. No intra-abdominal free air or free fluid. Hepatobiliary: Cholecystectomy. Mild intrahepatic biliary ductal dilatation, likely post cholecystectomy changes. No calcified stone noted in the central CBD. Pancreas: Unremarkable. No pancreatic ductal dilatation or surrounding inflammatory changes. Spleen: Subcentimeter splenic hypodense lesion is too small to characterize. Adrenals/Urinary Tract: The adrenal glands, kidneys, and visualized ureters appear unremarkable. Mild mucosal enhancement of the bladder noted. Clinical correlation is recommended to exclude cystitis. Stomach/Bowel: There is sigmoid diverticulosis without active inflammatory changes. No bowel obstruction or active inflammation. Normal appendix. Vascular/Lymphatic: Moderate aortoiliac atherosclerotic disease. The IVC is unremarkable. No portal venous gas. There is no adenopathy. Reproductive: Hysterectomy. No pelvic mass. Other: None Musculoskeletal: Osteopenia. No acute osseous pathology. IMPRESSION: 1. Mild mucosal enhancement of the bladder. Clinical correlation is recommended to exclude cystitis. 2. Sigmoid diverticulosis. No bowel obstruction or active inflammation. Normal appendix. Electronically Signed   By: AAnner CreteM.D.   On: 08/24/2018 04:15   Dg Abd Acute W/chest  Result Date: 08/24/2018 CLINICAL DATA:  Chest and abdominal pain couple hours. EXAM: DG ABDOMEN ACUTE W/ 1V CHEST COMPARISON:  Chest x-ray 06/10/2017 FINDINGS: Chest x-ray demonstrates volume loss of the right lung with moderate irregular density over  the medial right apex unchanged. Cardiomediastinal silhouette and remainder of the chest is unchanged. Abdominopelvic images demonstrate nonobstructive bowel gas pattern. No free peritoneal air. Degenerative change of the spine and hips. IMPRESSION:  Nonobstructive bowel gas pattern. No acute cardiopulmonary disease. Chronic stable changes of the medial right apex. Electronically Signed   By: Marin Olp M.D.   On: 08/24/2018 01:28     Left Heart Cath and Coronary Angiography 09/19/16  Conclusion    There is severe left ventricular systolic dysfunction.  LV end diastolic pressure is moderately elevated.  There is trivial (1+) mitral regurgitation.  Mild nonobstructive CAD with 85% smooth narrowing in the first diagonal branch of the LAD  Ost Cx to Prox Cx lesion, 10% stenosed.  1st Diag lesion, 25% stenosed.     Procedures Procedures (including critical care time)  Medications Ordered in ED Medications  iohexol (OMNIPAQUE) 300 MG/ML solution 100 mL (100 mLs Intravenous Contrast Given 08/24/18 0325)     Initial Impression / Assessment and Plan / ED Course  I have reviewed the triage vital signs and the nursing notes.  Pertinent labs & imaging results that were available during my care of the patient were reviewed by me and considered in my medical decision making (see chart for details).     72 year old female presents to the emergency department for left upper quadrant and epigastric pain x1 day.  Family was concerned for cardiac etiology; however cardiac work-up today has been reassuring.  She has had some diarrhea for the past 2 days.  No fevers.  She has mild focal epigastric tenderness, but no peritoneal signs on exam.  X-ray negative for obstruction, but subsequent CT was performed in light of persistent discomfort.  CT shows findings concerning for cystitis which is consistent with the patient's urinalysis today.  Plan for discharge on Keflex.  Discussed that this may worsen diarrhea.  Advised the use of an over-the-counter probiotic to counteract this side effect.  She may continue use of Tylenol as needed for discomfort.  Encouraged primary care follow-up.  Return precautions discussed and provided. Patient  discharged in stable condition with no unaddressed concerns.   Final Clinical Impressions(s) / ED Diagnoses   Final diagnoses:  Epigastric pain  Acute cystitis without hematuria    ED Discharge Orders         Ordered    cephALEXin (KEFLEX) 500 MG capsule  2 times daily     08/24/18 0459           Antonietta Breach, PA-C 08/30/18 1278    Veryl Speak, MD 09/01/18 0800

## 2018-08-23 NOTE — ED Triage Notes (Signed)
Pt reports LUQ pain x 1 days and occasional RUQ pain. Pt reports she has had diarrhea x 2 days. Pt denies nausea or vomiting. Pt denies urinary symptoms.

## 2018-08-24 ENCOUNTER — Emergency Department (HOSPITAL_COMMUNITY): Payer: Medicare Other

## 2018-08-24 DIAGNOSIS — K5730 Diverticulosis of large intestine without perforation or abscess without bleeding: Secondary | ICD-10-CM | POA: Diagnosis not present

## 2018-08-24 DIAGNOSIS — R109 Unspecified abdominal pain: Secondary | ICD-10-CM | POA: Diagnosis not present

## 2018-08-24 LAB — I-STAT TROPONIN, ED: Troponin i, poc: 0 ng/mL (ref 0.00–0.08)

## 2018-08-24 MED ORDER — CEPHALEXIN 500 MG PO CAPS: 500.0000 mg | ORAL_CAPSULE | Freq: Two times a day (BID) | ORAL | 0 refills | Status: DC

## 2018-08-24 MED ORDER — IOHEXOL 300 MG/ML  SOLN
100.0000 mL | Freq: Once | INTRAMUSCULAR | Status: AC | PRN
  Administered 2018-08-24: 100 mL via INTRAVENOUS

## 2018-08-24 NOTE — ED Notes (Signed)
Patient transported to CT 

## 2018-08-24 NOTE — ED Notes (Signed)
Patient verbalizes understanding of discharge instructions. Opportunity for questioning and answers were provided. Armband removed by staff, pt discharged from ED in wheelchair.  

## 2018-08-24 NOTE — Discharge Instructions (Addendum)
Your work-up today was significant for a urinary tract infection.  Take Keflex as prescribed for treatment of this.    No other acute process was noted in your abdomen or pelvis. Your heart enzyme test was normal and your EKG was unchanged compared to prior evaluations.   We advise that you continue Protonix as your upper abdominal pain may be due to reflux or gastritis. Start taking a probiotic for help improving your diarrhea. This can be purchased over-the-counter at your local pharmacy.  Follow-up with your primary care doctor to ensure resolution of symptoms.

## 2018-08-24 NOTE — ED Notes (Signed)
Patient transported to X-ray 

## 2018-08-24 NOTE — ED Notes (Signed)
ED Provider at bedside. 

## 2018-08-25 ENCOUNTER — Encounter (HOSPITAL_COMMUNITY): Payer: Self-pay | Admitting: Emergency Medicine

## 2018-08-25 ENCOUNTER — Emergency Department (HOSPITAL_COMMUNITY): Payer: Medicare Other

## 2018-08-25 ENCOUNTER — Emergency Department (HOSPITAL_COMMUNITY)
Admission: EM | Admit: 2018-08-25 | Discharge: 2018-08-25 | Disposition: A | Discharge status: Home / Self Care | Payer: Medicare Other | Attending: Emergency Medicine | Admitting: Emergency Medicine

## 2018-08-25 DIAGNOSIS — R4182 Altered mental status, unspecified: Secondary | ICD-10-CM | POA: Diagnosis present

## 2018-08-25 DIAGNOSIS — S0990XA Unspecified injury of head, initial encounter: Secondary | ICD-10-CM | POA: Diagnosis not present

## 2018-08-25 DIAGNOSIS — Y999 Unspecified external cause status: Secondary | ICD-10-CM | POA: Insufficient documentation

## 2018-08-25 DIAGNOSIS — N182 Chronic kidney disease, stage 2 (mild): Secondary | ICD-10-CM | POA: Insufficient documentation

## 2018-08-25 DIAGNOSIS — I130 Hypertensive heart and chronic kidney disease with heart failure and stage 1 through stage 4 chronic kidney disease, or unspecified chronic kidney disease: Secondary | ICD-10-CM | POA: Diagnosis not present

## 2018-08-25 DIAGNOSIS — I5032 Chronic diastolic (congestive) heart failure: Secondary | ICD-10-CM | POA: Insufficient documentation

## 2018-08-25 DIAGNOSIS — Z794 Long term (current) use of insulin: Secondary | ICD-10-CM | POA: Diagnosis not present

## 2018-08-25 DIAGNOSIS — R413 Other amnesia: Secondary | ICD-10-CM | POA: Diagnosis not present

## 2018-08-25 DIAGNOSIS — E119 Type 2 diabetes mellitus without complications: Secondary | ICD-10-CM | POA: Insufficient documentation

## 2018-08-25 DIAGNOSIS — Y929 Unspecified place or not applicable: Secondary | ICD-10-CM | POA: Insufficient documentation

## 2018-08-25 DIAGNOSIS — W050XXA Fall from non-moving wheelchair, initial encounter: Secondary | ICD-10-CM | POA: Insufficient documentation

## 2018-08-25 DIAGNOSIS — S060X0A Concussion without loss of consciousness, initial encounter: Secondary | ICD-10-CM | POA: Diagnosis not present

## 2018-08-25 DIAGNOSIS — Y939 Activity, unspecified: Secondary | ICD-10-CM | POA: Diagnosis not present

## 2018-08-25 DIAGNOSIS — Z79899 Other long term (current) drug therapy: Secondary | ICD-10-CM | POA: Insufficient documentation

## 2018-08-25 DIAGNOSIS — J449 Chronic obstructive pulmonary disease, unspecified: Secondary | ICD-10-CM | POA: Diagnosis not present

## 2018-08-25 DIAGNOSIS — Z87820 Personal history of traumatic brain injury: Secondary | ICD-10-CM

## 2018-08-25 DIAGNOSIS — Z87891 Personal history of nicotine dependence: Secondary | ICD-10-CM | POA: Insufficient documentation

## 2018-08-25 HISTORY — DX: Personal history of traumatic brain injury: Z87.820

## 2018-08-25 LAB — BASIC METABOLIC PANEL
Anion gap: 6 (ref 5–15)
BUN: 13 mg/dL (ref 8–23)
CO2: 27 mmol/L (ref 22–32)
Calcium: 8.9 mg/dL (ref 8.9–10.3)
Chloride: 103 mmol/L (ref 98–111)
Creatinine, Ser: 0.98 mg/dL (ref 0.44–1.00)
GFR calc Af Amer: >60 mL/min (ref >60)
GFR calc non Af Amer: 57 mL/min — ABNORMAL LOW (ref >60)
GLUCOSE: 219 mg/dL — AB (ref 70–99)
Potassium: 5 mmol/L (ref 3.5–5.1)
SODIUM: 136 mmol/L (ref 135–145)

## 2018-08-25 LAB — CBC
HCT: 36.9 % (ref 36.0–46.0)
Hemoglobin: 11.1 g/dL — ABNORMAL LOW (ref 12.0–15.0)
MCH: 28.3 pg (ref 26.0–34.0)
MCHC: 30.1 g/dL (ref 30.0–36.0)
MCV: 94.1 fL (ref 80.0–100.0)
PLATELETS: 215 10*3/uL (ref 150–400)
RBC: 3.92 MIL/uL (ref 3.87–5.11)
RDW: 15 % (ref 11.5–15.5)
WBC: 7 10*3/uL (ref 4.0–10.5)
nRBC: 0 % (ref 0.0–0.2)

## 2018-08-25 NOTE — ED Provider Notes (Signed)
[facility] EMERGENCY DEPARTMENT Provider Note   CSN: [id number] Arrival date & time: 08/25/18  1403     History   Chief Complaint Chief Complaint  Patient presents with  . Fall  . Head Injury  . Altered Mental Status    HPI [name] is a 72 y.o. female.  Patient with memory loss history, multiple chronic medical problems including stroke and COPD presents after head injury.  Patient was sitting in the walker/wheelchair and was actually tipped backwards hitting the back of her head.  No loss of consciousness, no vomiting.  Patient is currently being treated with antibiotics for UTI.  No fevers chills or vomiting.     Past Medical History:  Diagnosis Date  . Age-related nuclear cataract of both eyes 2016   +cortical age related cataracts OU   . Allergy    seasonal  . Arthritis    hands  . Bilateral diabetic retinopathy (HCC) 2015   Dr. Zigmund Daniel  . Blood transfusion without reported diagnosis    when taking chemo  . Chronic diastolic heart failure (HCC)    Takotsubo CM (resolved @5 /2018):  a. 08/2016 Echo: EF 30-35%, gr1DD, PASP 41mHg;  b. 11/2016 Echo: EF 40-45%, no rwma, nl RV fxn, mild TR, PASP 372mg.  . CKD (chronic kidney disease), stage II    GFR 60s  . 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  . Marland KitchenOPD (chronic obstructive pulmonary disease) ([place]  . CVA (cerebral vascular accident) ([facility]/2017   MRI did show a left-sided small ischemic stroke which was acute but likely incidental (MRI was done b/c pt had TIA sx's in hospital).  Neuro put her on plavix at that time.  Cardiology d/c'd her plavix 08/2017.  . Diabetes mellitus with complication (HCC)    diab retinopathy OU (laser)  . GERD (gastroesophageal reflux disease)    protonix  . History of diverticulitis of colon    with abscess; required IR percutaneous drain placed 09/2016.  . Marland Kitchenyperlipidemia   . Hypertension   . Left bundle branch block  (LBBB) 12/16/2016  . Lung cancer ([facility]   non-small cell lung ca, stage III in 05/2011; systemic chemotherapy concurrent with radiation followed by prophylactic cranial irradiation and has been observation since July of 2010 with no evidence for disease recurrence-released from onc f/u 12/2014 (needs annual cxr by PCP).  CXR 08/2015 stable.  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 MoEarlie Serverid bx and this was NEG for malignancy.  . Mild cognitive impairment with memory loss    Likely from brain radiation therapy  . Non-obstructive CAD (coronary artery disease)    a. Cath 2006 preserved LV fxn, scattered irregularities without critical stenosis; b. 2008 stress echo negative for ischemia, but with hypertensive response; c. 08/2016 Cath: D1 25%, otw nl.  . Normocytic anemia 2018   Iron, vit B12, folate all normal 12/2016.  . Marland Kitchenpen-angle glaucoma 2016   Dr. BeShirley Muscat(bilateral)---responding to topical therapy  . Osteoporosis 03/2016; 03/2018   03/2016 DEXA T-score -2.2.  03/2018 T-score L femoral neck  -2.7--boniva started.  . Pneumonia    hospitalization 11/2016; hypoxemic resp failure--d/c'd home with home oxygen therapy  . Subclinical hyperthyroidism 2018  . Takotsubo cardiomyopathy    a. 08/2016 Echo: EF 30-35%, gr1DD, PASP 3862m;  b. 08/2016 Cath: nonobs Dzs;  c. 11/2016 Echo: EF 40-45%, no rwma, nl RV fxn, mild TR, PASP 41m26m  ECHO 02/2017 EF 50-55%, grd II DD---essentially resolved  Takotsubo 02/2017.  . 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 08/2016 in setting of diverticulitis & pneumoperitoneum and Takotsubo CM -->prolonged QT, seen by EP-->avoid meds with potential for QT prolongation.    Patient Active Problem List   Diagnosis Date Noted  . Preoperative cardiovascular examination 01/12/2017  . Diarrhea 12/02/2016  . Acute respiratory failure with hypoxia ([place]) 12/02/2016  . CAP (community acquired pneumonia) 12/01/2016  . Essential hypertension 12/01/2016  . Acute encephalopathy 12/01/2016    . Chronic diastolic (congestive) heart failure ([place]) 12/01/2016  . Cerebrovascular accident (CVA) ([place])   . Hypokalemia 09/17/2016  . Hypertension associated with chronic kidney disease due to type 2 diabetes mellitus ([place]) 09/17/2016  . Cardiac arrest with ventricular fibrillation ([place]) 09/14/2016  . Takotsubo cardiomyopathy 09/14/2016  . Colonic diverticular abscess   . Acute respiratory failure ([place])   . Diverticulitis of large intestine with perforation and abscess 09/11/2016  . Debilitated patient 07/08/2016  . Arterial hypotension   . Controlled type 2 diabetes mellitus with hyperglycemia, with long-term current use of insulin ([place]) 06/14/2016  . Hyperlipidemia 06/14/2016  . COPD exacerbation ([place]) 10/18/2013  . Non-occlusive coronary artery disease 06/06/2012  . Allergic rhinitis 01/06/2011  . TOBACCO USE, QUIT 07/29/2009  . Uncontrolled type 2 diabetes mellitus with complication ([place]) 26/71/2458  . GERD (gastroesophageal reflux disease) 04/24/2007    Past Surgical History:  Procedure Laterality Date  . ABDOMINAL HYSTERECTOMY  1997  . APPENDECTOMY    . CARDIAC CATHETERIZATION N/A 09/14/2016   Procedure: Left Heart Cath and Coronary Angiography;  Surgeon: Troy Sine, MD;  Location: [facility];  Service: Cardiovascular; Mild nonobstructive CAD with 85% smooth narrowing in the first diagonal branch of the LAD; 10% smooth narrowing of the ostial proximal left circumflex coronary artery; and a normal dominant RCA.  Marland Kitchen CARPAL TUNNEL RELEASE    . CATARACT EXTRACTION    . CHOLECYSTECTOMY  2002  . COLONOSCOPY  10/24/00   normal.  [employer] hemoccult testing via [place] 06/18/15 was NEG  . dexa  03/2016; 03/2018   03/2018 T-score L femoral neck  -2.7 (worsened compared to osteopenic range 2017.)  . IR GENERIC HISTORICAL  09/19/2016   IR SINUS/FIST TUBE CHK-NON GI 09/19/2016 Corrie Mckusick, DO [facility]  . IR GENERIC HISTORICAL  10/06/2016   IR RADIOLOGIST EVAL & MGMT  10/06/2016 [facility]  . IR GENERIC HISTORICAL  10/26/2016   IR RADIOLOGIST EVAL & MGMT 10/26/2016 [facility]  . IR GENERIC HISTORICAL  11/03/2016   IR RADIOLOGIST EVAL & MGMT 11/03/2016 Ardis Rowan, PA-C [facility]  . IR GENERIC HISTORICAL  11/24/2016   IR RADIOLOGIST EVAL & MGMT 11/24/2016 Ardis Rowan, PA-C [facility]  . IR GENERIC HISTORICAL  12/05/2016   Fistula smaller/improving.  IR SINUS/FIST TUBE CHK-NON GI 12/05/2016 Sandi Mariscal, MD [facility]  . IR GENERIC HISTORICAL  12/22/2016   IR RADIOLOGIST EVAL & MGMT 12/22/2016 Greggory Keen, MD [facility]  . IR RADIOLOGIST EVAL & MGMT  01/10/2017  . IR RADIOLOGIST EVAL & MGMT  02/09/2017  . IR SINUS/FIST TUBE CHK-NON GI  03/28/2017  . LEFT HEART CATHETERIZATION WITH CORONARY ANGIOGRAM N/A 05/30/2012   Procedure: LEFT HEART CATHETERIZATION WITH CORONARY ANGIOGRAM;  Surgeon: Burnell Blanks, MD;  Location: [facility] C[facility] LAB;  Service: Cardiovascular: Mild, non-obstructive CAD  . TRANSTHORACIC ECHOCARDIOGRAM  09/14/2016; 11/2016   08/2016: EF 30-35 %, Akinesis of the mid-apicalanteroseptal myocardium.  Grade I DD.  Mild pulm HTN.  Repeat echo 11/2016, EF 40-45%, no rwma, nl RV fxn, mild TR, PASP 40mHg.    .Marland KitchenTRANSTHORACIC ECHOCARDIOGRAM  03/07/2017   EF 50-55%. Hypokinesis of the distal septum with overall low normal LV,  systolic function; mild diastolic dysfunction with elevated LV  filling pressure; mildly calcified aortic valve with mild AI; small pericardial effusion     OB History   None      Home Medications    Prior to Admission medications   Medication Sig Start Date End Date Taking? Authorizing Provider  acetaminophen (TYLENOL) 500 MG tablet Take 500-1,000 mg by mouth every 6 (six) hours as needed for headache (or pain).     [provider]  albuterol (PROAIR HFA) 108 (90 Base) MCG/ACT inhaler Inhale 2 puffs into the lungs every 6 (six) hours as needed for wheezing or shortness of  breath. 02/16/17   McGowen, PAdrian Blackwater MD  albuterol (PROVENTIL) (2.5 MG/3ML) 0.083% nebulizer solution USE ONE VIAL IN NEBULIZER EVERY 4 HOURS AS NEEDED FOR WHEEZING OR SHORTNESS OF BREATH Patient taking differently: Take 2.5 mg by nebulization every 4 (four) hours as needed for wheezing or shortness of breath.  05/05/17   McGowen, PAdrian Blackwater MD  aspirin 81 MG chewable tablet Chew 81 mg by mouth daily.    [provider]  atorvastatin (LIPITOR) 80 MG tablet TAKE 1 TABLET BY MOUTH ONCE DAILY Patient taking differently: Take 80 mg by mouth daily at 6 PM.  05/04/18   McGowen, PAdrian Blackwater MD  cephALEXin (KEFLEX) 500 MG capsule Take 1 capsule (500 mg total) by mouth 2 (two) times daily. 08/24/18   HAntonietta Breach PA-C  cetirizine (ZYRTEC) 10 MG tablet Take 10 mg by mouth daily.    [provider]  escitalopram (LEXAPRO) 20 MG tablet Take 1 tablet (20 mg total) by mouth daily. 08/10/18   McGowen, PAdrian Blackwater MD  fluticasone (FLOVENT HFA) 220 MCG/ACT inhaler Inhale 2 puffs into the lungs 2 (two) times daily. 02/16/17   McGowen, PAdrian Blackwater MD  furosemide (LASIX) 20 MG tablet TAKE 1 TABLET BY MOUTH ONCE DAILY Patient taking differently: Take 20 mg by mouth daily.  08/20/18   McGowen, PAdrian Blackwater MD  ibandronate (BONIVA) 150 MG tablet Take 1 tablet (150 mg total) by mouth every 30 (thirty) days. Take in the morning with a full glass of water, on an empty stomach, and do not take anything else by mouth or lie down for the next 30 min. 04/13/18   McGowen, PAdrian Blackwater MD  Insulin Isophane & Regular Human (HUMULIN 70/30 KWIKPEN) (70-30) 100 UNIT/ML PEN Take a base dose of 9 U SQ at breakfast and 7 U SQ at evening meal + add additional units PER SLIDING SCALE EVERY MORNING AND EVENING 140-199 2UNITS, 200-250 4 UNITS, 251-299 6 UNITS, 300-349 8 UNITS, 350 OR ABOVE 10 UNITS Patient taking differently: Inject 7-19 Units into the skin See admin instructions. Take a base dose of 9 U SQ at breakfast and 7 U SQ at evening  meal + add additional units PER SLIDING SCALE EVERY MORNING AND EVENING 140-199 2UNITS, 200-250 4 UNITS, 251-299 6 UNITS, 300-349 8 UNITS, 350 OR ABOVE 10 UNITS 07/27/18   McGowen, PAdrian Blackwater MD  metFORMIN (GLUCOPHAGE) 500 MG tablet Take 1 tablet (500 mg total) by mouth 2 (two) times daily with a meal. 05/10/18   McGowen, PAdrian Blackwater MD  metoprolol tartrate (LOPRESSOR) 25 MG tablet TAKE 1 TABLET BY MOUTH IN THE MORNING AND  2 AT BEDTIME.Please schedule appointment for refills. Patient taking differently: Take 25-50 mg by mouth See admin instructions. TAKE 1 TABLET BY MOUTH IN THE MORNING AND 2 AT BEDTIME.Please schedule appointment for refills. 04/16/18   Leonie Man, MD  ONE TOUCH ULTRA TEST test strip USE  STRIP TO CHECK GLUCOSE THREE TIMES DAILY 05/31/18   McGowen, [name], MD  [facility] DELICA LANCETS 88C MISC USE 1  TO CHECK GLUCOSE THREE TIMES DAILY 05/04/18   McGowen, [name], MD  pantoprazole (PROTONIX) 40 MG tablet TAKE 1 TABLET BY MOUTH ONCE DAILY Patient taking differently: Take 40 mg by mouth daily.  08/06/18   McGowen, [name], MD  potassium chloride (K-DUR) 10 MEQ tablet TAKE 1 TABLET BY MOUTH ONCE DAILY Patient taking differently: Take 10 mEq by mouth daily.  08/06/18   McGowen, [name], MD    Family History Family History  Problem Relation Age of Onset  . Heart failure Mother   . Kidney disease Mother        renal failure  . Diabetes Mother   . Diabetes Father   . Diabetes Brother   . Heart disease Brother   . Heart attack Brother   . Heart attack Brother   . Heart attack Brother   . Colon cancer Neg Hx     Social History Social History   Tobacco Use  . Smoking status: Former Smoker    Packs/day: 1.00    Years: 45.00    Pack years: 45.00    Types: Cigarettes    Last attempt to quit: 10/24/2008    Years since quitting: 9.8  . Smokeless tobacco: Never Used  Substance Use Topics  . Alcohol use: No    Alcohol/week: 0.0 standard drinks  . Drug use: No      Allergies   Lactose intolerance (gi) and Ibuprofen   Review of Systems Review of Systems  Constitutional: Negative for chills and fever.  HENT: Negative for congestion.   Eyes: Negative for visual disturbance.  Respiratory: Negative for shortness of breath.   Cardiovascular: Negative for chest pain.  Gastrointestinal: Negative for abdominal pain and vomiting.  Genitourinary: Negative for dysuria and flank pain.  Musculoskeletal: Negative for back pain, neck pain and neck stiffness.  Skin: Negative for rash.  Neurological: Negative for light-headedness and headaches.     Physical Exam Updated Vital Signs BP (!) 117/59   Pulse 62   Temp 98.2 F (36.8 C) (Oral)   Resp 16   SpO2 97%   Physical Exam  Constitutional: She is oriented to person, place, and time. She appears well-developed and well-nourished.  HENT:  Head: Normocephalic and atraumatic.  Eyes: Conjunctivae are normal. Right eye exhibits no discharge. Left eye exhibits no discharge.  Neck: Normal range of motion. Neck supple. No tracheal deviation present.  Cardiovascular: Normal rate and regular rhythm.  Pulmonary/Chest: Effort normal and breath sounds normal.  Abdominal: Soft. She exhibits no distension. There is no tenderness. There is no guarding.  Musculoskeletal: She exhibits no edema.  Patient has no midline cervical tenderness full range of motion head neck.  Patient has no tenderness to major joints with flexion extension equal bilateral.  Neurological: She is alert and oriented to person, place, and time. No cranial nerve deficit. GCS eye subscore is 4. GCS verbal subscore is 5. GCS motor subscore is 6.  5+ strength in UE and LE with f/e at major joints. Sensation to palpation intact in UE and LE. CNs 2-12 grossly  intact.  EOMFI.  PERRL.   Finger nose and coordination intact bilateral.   No nystagmus   Skin: Skin is warm. No rash noted.  Psychiatric:  Mild memory short term  Nursing note and  vitals reviewed.    ED Treatments / Results  Labs (all labs ordered are listed, but only abnormal results are displayed) Labs Reviewed  CBC - Abnormal; Notable for the following components:      Result Value   Hemoglobin 11.1 (*)    All other components within normal limits  BASIC METABOLIC PANEL - Abnormal; Notable for the following components:   Glucose, Bld 219 (*)    GFR calc non Af Amer 57 (*)    All other components within normal limits    EKG None  Radiology Ct Head Wo Contrast  Result Date: 08/25/2018 CLINICAL DATA:  Fall today.  On blood thinner. EXAM: CT HEAD WITHOUT CONTRAST TECHNIQUE: Contiguous axial images were obtained from the base of the skull through the vertex without intravenous contrast. COMPARISON:  CT head 06/10/2017 FINDINGS: Brain: Moderate atrophy and moderate chronic microvascular ischemic change. No acute abnormality and no change from the prior study. Negative for acute infarct, hemorrhage, mass. No midline shift. Vascular: Negative for hyperdense vessel Skull: Negative Sinuses/Orbits: Paranasal sinuses clear. Bilateral cataract surgery. Other: None IMPRESSION: No acute intracranial abnormality. Moderate atrophy and moderate chronic microvascular ischemic change in the white matter. Electronically Signed   By: Franchot Gallo M.D.   On: 08/25/2018 15:05   Ct Abdomen Pelvis W Contrast  Result Date: 08/24/2018 CLINICAL DATA:  72 year old female with abdominal pain. EXAM: CT ABDOMEN AND PELVIS WITH CONTRAST TECHNIQUE: Multidetector CT imaging of the abdomen and pelvis was performed using the standard protocol following bolus administration of intravenous contrast. CONTRAST:  165m OMNIPAQUE IOHEXOL 300 MG/ML  SOLN COMPARISON:  Abdominal radiograph dated 08/24/2018 and CT dated 12/02/2016 FINDINGS: Lower chest: Minimal bibasilar subpleural atelectasis/scarring. The visualized lungs otherwise clear. No intra-abdominal free air or free fluid. Hepatobiliary:  Cholecystectomy. Mild intrahepatic biliary ductal dilatation, likely post cholecystectomy changes. No calcified stone noted in the central CBD. Pancreas: Unremarkable. No pancreatic ductal dilatation or surrounding inflammatory changes. Spleen: Subcentimeter splenic hypodense lesion is too small to characterize. Adrenals/Urinary Tract: The adrenal glands, kidneys, and visualized ureters appear unremarkable. Mild mucosal enhancement of the bladder noted. Clinical correlation is recommended to exclude cystitis. Stomach/Bowel: There is sigmoid diverticulosis without active inflammatory changes. No bowel obstruction or active inflammation. Normal appendix. Vascular/Lymphatic: Moderate aortoiliac atherosclerotic disease. The IVC is unremarkable. No portal venous gas. There is no adenopathy. Reproductive: Hysterectomy. No pelvic mass. Other: None Musculoskeletal: Osteopenia. No acute osseous pathology. IMPRESSION: 1. Mild mucosal enhancement of the bladder. Clinical correlation is recommended to exclude cystitis. 2. Sigmoid diverticulosis. No bowel obstruction or active inflammation. Normal appendix. Electronically Signed   By: AAnner CreteM.D.   On: 08/24/2018 04:15   Dg Abd Acute W/chest  Result Date: 08/24/2018 CLINICAL DATA:  Chest and abdominal pain couple hours. EXAM: DG ABDOMEN ACUTE W/ 1V CHEST COMPARISON:  Chest x-ray 06/10/2017 FINDINGS: Chest x-ray demonstrates volume loss of the right lung with moderate irregular density over the medial right apex unchanged. Cardiomediastinal silhouette and remainder of the chest is unchanged. Abdominopelvic images demonstrate nonobstructive bowel gas pattern. No free peritoneal air. Degenerative change of the spine and hips. IMPRESSION: Nonobstructive bowel gas pattern. No acute cardiopulmonary disease. Chronic stable changes of the medial right apex. Electronically Signed   By: DMarin OlpM.D.   On:  08/24/2018 01:28    Procedures Procedures (including  critical care time)  Medications Ordered in ED Medications - No data to display   Initial Impression / Assessment and Plan / ED Course  I have reviewed the triage vital signs and the nursing notes.  Pertinent labs & imaging results that were available during my care of the patient were reviewed by me and considered in my medical decision making (see chart for details).    Patient presents after low risk head injury, CT scan of the head no acute bleeding or fracture.  Screening blood work unremarkable.  Patient having mild memory issues since the event.  Discussed clinically concussion.  Discussed outpatient follow-up.    Final Clinical Impressions(s) / ED Diagnoses   Final diagnoses:  Concussion without loss of consciousness, initial encounter  Injury of head, initial encounter  Memory loss    ED Discharge Orders    None       Elnora Morrison, MD 08/25/18 1730

## 2018-08-25 NOTE — Discharge Instructions (Addendum)
Follow-up closely with your primary doctor.  Return for new concerns such as weakness or numbness on one side your body, fevers, recurrent vomiting, confusion.
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
## 2018-08-25 NOTE — ED Notes (Signed)
Called pt for vitals, no answer

## 2018-08-25 NOTE — ED Notes (Signed)
Patient verbalizes understanding of discharge instructions. Opportunity for questioning and answers were provided. Armband removed by staff, pt discharged from ED.  
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
## 2018-08-26 ENCOUNTER — Encounter: Payer: Self-pay | Admitting: Family Medicine

## 2018-08-26 LAB — URINE CULTURE

## 2018-08-27 ENCOUNTER — Telehealth: Payer: Self-pay | Admitting: Emergency Medicine

## 2018-08-27 NOTE — Telephone Encounter (Signed)
Post ED Visit - Positive Culture Follow-up  Culture report reviewed by antimicrobial stewardship pharmacist:  []  Elenor Quinones, Pharm.D. []  Heide Guile, Pharm.D., BCPS AQ-ID []  Parks Neptune, Pharm.D., BCPS []  Alycia Rossetti, Pharm.D., BCPS []  [place], Pharm.D., BCPS, AAHIVP []  Legrand Como, Pharm.D., BCPS, AAHIVP []  Salome Arnt, PharmD, BCPS []  Johnnette Gourd, PharmD, BCPS [x]  Hughes Better, PharmD, BCPS []  Leeroy Cha, PharmD  Positive urine culture Treated with cephalexin, organism sensitive to the same and no further patient follow-up is required at this time.  Hazle Nordmann 08/27/2018, 9:32 AM

## 2018-08-28 ENCOUNTER — Encounter: Payer: Self-pay | Admitting: Family Medicine

## 2018-08-28 ENCOUNTER — Ambulatory Visit (INDEPENDENT_AMBULATORY_CARE_PROVIDER_SITE_OTHER): Payer: Medicare Other | Admitting: Family Medicine

## 2018-08-28 ENCOUNTER — Encounter: Payer: Self-pay | Admitting: *Deleted

## 2018-08-28 VITALS — BP 106/63 | HR 78 | Temp 97.6°F | Resp 16 | Ht 62.0 in | Wt 159.0 lb

## 2018-08-28 DIAGNOSIS — S0003XD Contusion of scalp, subsequent encounter: Secondary | ICD-10-CM | POA: Diagnosis not present

## 2018-08-28 DIAGNOSIS — R1013 Epigastric pain: Secondary | ICD-10-CM | POA: Diagnosis not present

## 2018-08-28 DIAGNOSIS — S060X0D Concussion without loss of consciousness, subsequent encounter: Secondary | ICD-10-CM | POA: Diagnosis not present

## 2018-08-28 DIAGNOSIS — N3000 Acute cystitis without hematuria: Secondary | ICD-10-CM | POA: Diagnosis not present

## 2018-08-28 NOTE — Progress Notes (Signed)
OFFICE VISIT  08/28/2018   CC:  Chief Complaint  Patient presents with  . Follow-up    ER   HPI:    Patient is a 72 y.o. Caucasian female who presents accompanied by her son for ED visit f/u. Was seen 08/23/18 with epigastric pain x 1d, diarrhea x 2d-->UA abnl-->and dx'd with acute UTI and rx'd keflex. Was seen 08/25/18 for hitting the back of her head when her walker was tipped backward when her great grandchild was pushingher.  No LOC.  Imaging showed no acute problem, she was dx'd with concussion. Reviewed all records from each ED visit today.  Pt had some memory problems after she hit her head (didn't know what house she was at, who people around her were). After being in ED a couple hours, her memory and mentation returned to baseline.  She has been at her normal baseline since that time. Says back of head still hurts some.  No dizziness.  NO nausea.   No focal weakness.  Doesn't eat well---but this is not new.  Drinks coffee all day. Abd: still having some intermittent upper mid abdomen pain w/out n/v.  No fevers.  No SOB. Taking excedrin for pain.  ROS: no CP, no SOB, no wheezing, no cough, no melena/hematochezia.  No polyuria or polydipsia.  No myalgias or arthralgias.   Past Medical History:  Diagnosis Date  . Age-related nuclear cataract of both eyes 2016   +cortical age related cataracts OU   . Allergy    seasonal  . Arthritis    hands  . Bilateral diabetic retinopathy (HCC) 2015   Dr. Zigmund Daniel  . Blood transfusion without reported diagnosis    when taking chemo  . Chronic diastolic heart failure (HCC)    Takotsubo CM (resolved @5 /2018):  a. 08/2016 Echo: EF 30-35%, gr1DD, PASP 39mHg;  b. 11/2016 Echo: EF 40-45%, no rwma, nl RV fxn, mild TR, PASP 358mg.  . CKD (chronic kidney disease), stage II    GFR 60s  . 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  . Concussion 08/25/2018   w/out loss of consciousness--mild increase in  baseline memory impairment after.  CT head neg acute.  . Marland KitchenOPD (chronic obstructive pulmonary disease) ([facility]  . CVA (cerebral vascular accident) ([facility]12/2017   MRI did show a left-sided small ischemic stroke which was acute but likely incidental (MRI was done b/c pt had TIA sx's in hospital).  Neuro put her on plavix at that time.  Cardiology d/c'd her plavix 08/2017.  . Diabetes mellitus with complication (HCC)    diab retinopathy OU (laser)  . GERD (gastroesophageal reflux disease)    protonix  . History of diverticulitis of colon    with abscess; required IR percutaneous drain placed 09/2016.  . Marland Kitchenyperlipidemia   . Hypertension   . Left bundle branch block (LBBB) 12/16/2016  . Lung cancer ([facility]   non-small cell lung ca, stage III in 05/2011; systemic chemotherapy concurrent with radiation followed by prophylactic cranial irradiation and has been observation since July of 2010 with no evidence for disease recurrence-released from onc f/u 12/2014 (needs annual cxr by PCP).  CXR 08/2015 stable.  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 MoEarlie Serverid bx and this was NEG for malignancy.  . Mild cognitive impairment with memory loss    Likely from brain radiation therapy  . Non-obstructive CAD (coronary artery disease)    a. Cath 2006 preserved LV fxn, scattered irregularities without critical stenosis;  b. 2008 stress echo negative for ischemia, but with hypertensive response; c. 08/2016 Cath: D1 25%, otw nl.  . Normocytic anemia 2018   Iron, vit B12, folate all normal 12/2016.  Marland Kitchen Open-angle glaucoma 2016   Dr. Shirley Muscat, (bilateral)---responding to topical therapy  . Osteoporosis 03/2016; 03/2018   03/2016 DEXA T-score -2.2.  03/2018 T-score L femoral neck  -2.7--boniva started.  . Pneumonia    hospitalization 11/2016; hypoxemic resp failure--d/c'd home with home oxygen therapy  . Subclinical hyperthyroidism 2018  . Takotsubo cardiomyopathy    a. 08/2016 Echo: EF 30-35%, gr1DD, PASP 57mHg;  b. 08/2016  Cath: nonobs Dzs;  c. 11/2016 Echo: EF 40-45%, no rwma, nl RV fxn, mild TR, PASP 392mg.  ECHO 02/2017 EF 50-55%, grd II DD---essentially resolved Takotsubo 02/2017.  . Torsades de pointes (HCGregg11/2017   a. 08/2016 in setting of diverticulitis & pneumoperitoneum and Takotsubo CM -->prolonged QT, seen by EP-->avoid meds with potential for QT prolongation.    Past Surgical History:  Procedure Laterality Date  . ABDOMINAL HYSTERECTOMY  1997  . APPENDECTOMY    . CARDIAC CATHETERIZATION N/A 09/14/2016   Procedure: Left Heart Cath and Coronary Angiography;  Surgeon: ThTroy SineMD;  [facility];  Service: Cardiovascular; Mild nonobstructive CAD with 85% smooth narrowing in the first diagonal branch of the LAD; 10% smooth narrowing of the ostial proximal left circumflex coronary artery; and a normal dominant RCA.  . Marland KitchenARPAL TUNNEL RELEASE    . CATARACT EXTRACTION    . CHOLECYSTECTOMY  2002  . COLONOSCOPY  10/24/00   normal.  [employer] hemoccult testing via [place]/25/16 was NEG  . dexa  03/2016; 03/2018   03/2018 T-score L femoral neck  -2.7 (worsened compared to osteopenic range 2017.)  . IR GENERIC HISTORICAL  09/19/2016   IR SINUS/FIST TUBE CHK-NON GI 09/19/2016 JaCorrie MckusickDO [facility]  . IR GENERIC HISTORICAL  10/06/2016   IR RADIOLOGIST EVAL & MGMT 10/06/2016 [facility]  . IR GENERIC HISTORICAL  10/26/2016   IR RADIOLOGIST EVAL & MGMT 10/26/2016 [facility]  . IR GENERIC HISTORICAL  11/03/2016   IR RADIOLOGIST EVAL & MGMT 11/03/2016 WeArdis RowanPA-C [facility]  . IR GENERIC HISTORICAL  11/24/2016   IR RADIOLOGIST EVAL & MGMT 11/24/2016 WeArdis RowanPA-C [facility]  . IR GENERIC HISTORICAL  12/05/2016   Fistula smaller/improving.  IR SINUS/FIST TUBE CHK-NON GI 12/05/2016 JoSandi MariscalMD [facility]  . IR GENERIC HISTORICAL  12/22/2016   IR RADIOLOGIST EVAL & MGMT 12/22/2016 MiGreggory KeenMD [facility]  . IR RADIOLOGIST EVAL & MGMT   01/10/2017  . IR RADIOLOGIST EVAL & MGMT  02/09/2017  . IR SINUS/FIST TUBE CHK-NON GI  03/28/2017  . LEFT HEART CATHETERIZATION WITH CORONARY ANGIOGRAM N/A 05/30/2012   Procedure: LEFT HEART CATHETERIZATION WITH CORONARY ANGIOGRAM;  Surgeon: ChBurnell BlanksMD;  [facility];  Service: Cardiovascular: Mild, non-obstructive CAD  . TRANSTHORACIC ECHOCARDIOGRAM  09/14/2016; 11/2016   08/2016: EF 30-35 %, Akinesis of the mid-apicalanteroseptal myocardium.  Grade I DD.  Mild pulm HTN.  Repeat echo 11/2016, EF 40-45%, no rwma, nl RV fxn, mild TR, PASP 3776m.    . TMarland KitchenANSTHORACIC ECHOCARDIOGRAM  03/07/2017   EF 50-55%. Hypokinesis of the distal septum with overall low normal LV,  systolic function; mild diastolic dysfunction with elevated LV  filling pressure; mildly calcified aortic valve with mild AI; small pericardial effusion  Outpatient Medications Prior to Visit  Medication Sig Dispense Refill  . acetaminophen (TYLENOL) 500 MG tablet Take 500-1,000 mg by mouth every 6 (six) hours as needed for headache (or pain).     Marland Kitchen albuterol (PROAIR HFA) 108 (90 Base) MCG/ACT inhaler Inhale 2 puffs into the lungs every 6 (six) hours as needed for wheezing or shortness of breath. 18 g 3  . albuterol (PROVENTIL) (2.5 MG/3ML) 0.083% nebulizer solution USE ONE VIAL IN NEBULIZER EVERY 4 HOURS AS NEEDED FOR WHEEZING OR SHORTNESS OF BREATH (Patient taking differently: Take 2.5 mg by nebulization every 4 (four) hours as needed for wheezing or shortness of breath. ) 75 mL 3  . aspirin 81 MG chewable tablet Chew 81 mg by mouth daily.    Marland Kitchen atorvastatin (LIPITOR) 80 MG tablet TAKE 1 TABLET BY MOUTH ONCE DAILY (Patient taking differently: Take 80 mg by mouth daily at 6 PM. ) 90 tablet 1  . cephALEXin (KEFLEX) 500 MG capsule Take 1 capsule (500 mg total) by mouth 2 (two) times daily. 14 capsule 0  . cetirizine (ZYRTEC) 10 MG tablet Take 10 mg by mouth daily.    Marland Kitchen escitalopram (LEXAPRO) 20 MG tablet Take 1 tablet  (20 mg total) by mouth daily. 30 tablet 3  . fluticasone (FLOVENT HFA) 220 MCG/ACT inhaler Inhale 2 puffs into the lungs 2 (two) times daily. 12 g 6  . furosemide (LASIX) 20 MG tablet TAKE 1 TABLET BY MOUTH ONCE DAILY (Patient taking differently: Take 20 mg by mouth daily. ) 90 tablet 1  . ibandronate (BONIVA) 150 MG tablet Take 1 tablet (150 mg total) by mouth every 30 (thirty) days. Take in the morning with a full glass of water, on an empty stomach, and do not take anything else by mouth or lie down for the next 30 min. 3 tablet 3  . Insulin Isophane & Regular Human (HUMULIN 70/30 KWIKPEN) (70-30) 100 UNIT/ML PEN Take a base dose of 9 U SQ at breakfast and 7 U SQ at evening meal + add additional units PER SLIDING SCALE EVERY MORNING AND EVENING 140-199 2UNITS, 200-250 4 UNITS, 251-299 6 UNITS, 300-349 8 UNITS, 350 OR ABOVE 10 UNITS (Patient taking differently: Inject 7-19 Units into the skin See admin instructions. Take a base dose of 9 U SQ at breakfast and 7 U SQ at evening meal + add additional units PER SLIDING SCALE EVERY MORNING AND EVENING 140-199 2UNITS, 200-250 4 UNITS, 251-299 6 UNITS, 300-349 8 UNITS, 350 OR ABOVE 10 UNITS) 15 pen 1  . metFORMIN (GLUCOPHAGE) 500 MG tablet Take 1 tablet (500 mg total) by mouth 2 (two) times daily with a meal. 180 tablet 1  . metoprolol tartrate (LOPRESSOR) 25 MG tablet TAKE 1 TABLET BY MOUTH IN THE MORNING AND 2 AT BEDTIME.Please schedule appointment for refills. (Patient taking differently: Take 25-50 mg by mouth See admin instructions. TAKE 1 TABLET BY MOUTH IN THE MORNING AND 2 AT BEDTIME.Please schedule appointment for refills.) 270 tablet 0  . ONE TOUCH ULTRA TEST test strip USE  STRIP TO CHECK GLUCOSE THREE TIMES DAILY 100 each 11  . ONETOUCH DELICA LANCETS 87O MISC USE 1  TO CHECK GLUCOSE THREE TIMES DAILY 100 each 11  . pantoprazole (PROTONIX) 40 MG tablet TAKE 1 TABLET BY MOUTH ONCE DAILY (Patient taking differently: Take 40 mg by mouth daily. ) 90  tablet 3  . potassium chloride (K-DUR) 10 MEQ tablet TAKE 1 TABLET BY MOUTH ONCE DAILY (Patient taking differently: Take 10  mEq by mouth daily. ) 90 tablet 1   No facility-administered medications prior to visit.     Allergies  Allergen Reactions  . Lactose Intolerance (Gi) Diarrhea  . Ibuprofen Other (See Comments)    REACTION: Anxiousness, hyperventilates    ROS As per HPI  PE: Blood pressure 106/63, pulse 78, temperature 97.6 F (36.4 C), temperature source Oral, resp. rate 16, height 5' 2"  (1.575 m), weight 159 lb (72.1 kg), SpO2 96 %. Gen: Alert, well appearing.  Patient is oriented to person, place, time, and situation. AFFECT: pleasant, lucid thought and speech. HEAD: R side of vertex region with marble sized tender subQ swelling c/w small scalp hematoma. No bruising of skin, no abrasion. CV: RRR, no m/r/g.   LUNGS: CTA bilat, nonlabored resps, good aeration in all lung fields. Neuro: CN 2-12 intact bilaterally, strength 5/5 in proximal and distal upper extremities and lower extremities bilaterally.  No sensory deficits.  No tremor.  FNF normal bilat.  Ambulates using walker as per her normal. Upper extremity and lower extremity DTRs symmetric.  No pronator drift.    LABS:  Lab Results  Component Value Date   WBC 7.0 08/25/2018   HGB 11.1 (L) 08/25/2018   HCT 36.9 08/25/2018   MCV 94.1 08/25/2018   PLT 215 08/25/2018     Chemistry      Component Value Date/Time   NA 136 08/25/2018 1433   NA 141 01/15/2015 0848   K 5.0 08/25/2018 1433   K 4.0 01/15/2015 0848   CL 103 08/25/2018 1433   CL 105 02/18/2013 0812   CO2 27 08/25/2018 1433   CO2 25 01/15/2015 0848   BUN 13 08/25/2018 1433   BUN 9.0 01/15/2015 0848   CREATININE 0.98 08/25/2018 1433   CREATININE 1.01 (H) 08/10/2018 1611   CREATININE 0.7 01/15/2015 0848      Component Value Date/Time   CALCIUM 8.9 08/25/2018 1433   CALCIUM 9.2 01/15/2015 0848   ALKPHOS 82 08/23/2018 1737   ALKPHOS 92 01/15/2015  0848   AST 39 08/23/2018 1737   AST 19 01/15/2015 0848   ALT 31 08/23/2018 1737   ALT 20 01/15/2015 0848   BILITOT 0.6 08/23/2018 1737   BILITOT 0.34 01/15/2015 0848     Lab Results  Component Value Date   HGBA1C 7.3 (H) 08/10/2018    IMPRESSION AND PLAN:  1) Epigastric pain, intermittent: I think she has dyspepsia/gastritis from her chronic over-ingestion of coffee. I recommended she gradually cut back on her coffee intake.  She'll finish her keflex that was rx'd for UTI (08/23/18 urine clx showed e coli that was pansensitive).  Recommended she change from excedrin to plain acetaminophen for her HA/scalp pain.  2) Concussion: resolved.  She had only some memory/cognitive impairment for a couple of hours after hitting her head. Since then she has been back to her baseline LOC/MS.   Small scalp hematoma where she hit her head: tylenol for pain--> stop excedrin.  Spent 30 min with pt today, with >50% of this time spent in counseling and care coordination regarding the above problems.  An After Visit Summary was printed and given to the patient.  FOLLOW UP: Return for keep appt already scheduled for 10/2018.  Signed:  Crissie Sickles, MD           08/28/2018

## 2018-09-17 ENCOUNTER — Encounter (INDEPENDENT_AMBULATORY_CARE_PROVIDER_SITE_OTHER): Payer: Medicare Other | Admitting: Ophthalmology

## 2018-09-17 DIAGNOSIS — E11311 Type 2 diabetes mellitus with unspecified diabetic retinopathy with macular edema: Secondary | ICD-10-CM | POA: Diagnosis not present

## 2018-09-17 DIAGNOSIS — E113513 Type 2 diabetes mellitus with proliferative diabetic retinopathy with macular edema, bilateral: Secondary | ICD-10-CM

## 2018-09-17 DIAGNOSIS — I10 Essential (primary) hypertension: Secondary | ICD-10-CM

## 2018-09-17 DIAGNOSIS — H35033 Hypertensive retinopathy, bilateral: Secondary | ICD-10-CM

## 2018-09-17 DIAGNOSIS — H43813 Vitreous degeneration, bilateral: Secondary | ICD-10-CM

## 2018-09-17 DIAGNOSIS — H59033 Cystoid macular edema following cataract surgery, bilateral: Secondary | ICD-10-CM

## 2018-09-19 ENCOUNTER — Encounter: Payer: Self-pay | Admitting: Cardiology

## 2018-09-19 ENCOUNTER — Ambulatory Visit: Payer: Medicare Other | Admitting: Cardiology

## 2018-09-19 VITALS — BP 112/60 | HR 84 | Ht 62.0 in | Wt 157.4 lb

## 2018-09-19 DIAGNOSIS — I10 Essential (primary) hypertension: Secondary | ICD-10-CM

## 2018-09-19 DIAGNOSIS — E782 Mixed hyperlipidemia: Secondary | ICD-10-CM | POA: Diagnosis not present

## 2018-09-19 DIAGNOSIS — I5032 Chronic diastolic (congestive) heart failure: Secondary | ICD-10-CM | POA: Diagnosis not present

## 2018-09-19 DIAGNOSIS — I5181 Takotsubo syndrome: Secondary | ICD-10-CM | POA: Diagnosis not present

## 2018-09-19 DIAGNOSIS — E785 Hyperlipidemia, unspecified: Secondary | ICD-10-CM

## 2018-09-19 LAB — LIPID PANEL
CHOL/HDL RATIO: 3.3 ratio (ref 0.0–4.4)
Cholesterol, Total: 111 mg/dL (ref 100–199)
HDL: 34 mg/dL — ABNORMAL LOW (ref >39)
LDL Calculated: 48 mg/dL (ref 0–99)
TRIGLYCERIDES: 147 mg/dL (ref 0–149)
VLDL Cholesterol Cal: 29 mg/dL (ref 5–40)

## 2018-09-19 LAB — COMPREHENSIVE METABOLIC PANEL
A/G RATIO: 1.6 (ref 1.2–2.2)
ALT: 17 IU/L (ref 0–32)
AST: 20 IU/L (ref 0–40)
Albumin: 4.2 g/dL (ref 3.5–4.8)
Alkaline Phosphatase: 92 IU/L (ref 39–117)
BUN/Creatinine Ratio: 15 (ref 12–28)
BUN: 16 mg/dL (ref 8–27)
Bilirubin Total: 0.3 mg/dL (ref 0.0–1.2)
CALCIUM: 9.4 mg/dL (ref 8.7–10.3)
CO2: 23 mmol/L (ref 20–29)
CREATININE: 1.05 mg/dL — AB (ref 0.57–1.00)
Chloride: 99 mmol/L (ref 96–106)
GFR, EST AFRICAN AMERICAN: 62 mL/min/{1.73_m2} (ref >59)
GFR, EST NON AFRICAN AMERICAN: 54 mL/min/{1.73_m2} — AB (ref >59)
GLUCOSE: 146 mg/dL — AB (ref 65–99)
Globulin, Total: 2.7 g/dL (ref 1.5–4.5)
Potassium: 4.8 mmol/L (ref 3.5–5.2)
Sodium: 141 mmol/L (ref 134–144)
TOTAL PROTEIN: 6.9 g/dL (ref 6.0–8.5)

## 2018-09-19 MED ORDER — ATORVASTATIN CALCIUM 80 MG PO TABS: 40.0000 mg | ORAL_TABLET | Freq: Every day | ORAL | 1 refills | Status: DC

## 2018-09-19 MED ORDER — FUROSEMIDE 20 MG PO TABS: 20.0000 mg | ORAL_TABLET | ORAL | 1 refills | Status: DC

## 2018-09-19 NOTE — Assessment & Plan Note (Signed)
Resolved with normalization of EF.  No longer having any heart failure symptoms. Okay to reduce Lasix to every other day. Is on low-dose metoprolol only. Blood pressure not high enough to add ACE inhibitor or ARB.

## 2018-09-19 NOTE — Assessment & Plan Note (Signed)
Pretty much stable now euvolemic.  Back off Lasix to every other day and as needed.

## 2018-09-19 NOTE — Progress Notes (Signed)
PCP: [name], MD  Clinic Note: Chief Complaint  Patient presents with  . Follow-up    Doing well  . Cardiomyopathy    Takotsubo cardiomyopathy, resolved   HPI: [name] is a 72 y.o. female with a h/o Takotsubo CM (cardiac arrest) in setting of Septic Shock 2/2 pelvic abscess in Nov 2017 that subsequently recovered to normal LV EF on f/u Echo I 02/2017.  She also has h/o Small Cell Lung Ca, COPD & s/p CVA.  She now presents for 1 yr f/u.    [name] was last seen in Nov 2018 - regaining strength. No cardiac c/o.  Recent Hospitalizations: -   ER 10/31 - epigastric pain --> UTI, Rx Abx  ER 11/2 -concussion, fell backwards while sitting in rolling walker -being pushed by her grandchild.  NO LOC.   Studies Personally Reviewed - (if available, images/films reviewed: From Epic Chart or Care Everywhere)  No new studies.  Interval History: [name] presents today overall doing quite well from a cardiac standpoint.  She still has quite a coughing and when she does that she feels some discomfort in her chest from the CPR.  Is still some tenderness to palpation along the sternal border.  Otherwise she does not have any chest pain or pressure.  She says she occasionally gets dizzy when she stands up really fast but goes away on its own.  She has not had any syncope or near syncope. No PND, orthopnea or edema --Lasix was already reduced to 20 mg daily from 40 mg, questioning if she can reduce any further.  She has baseline exertional dyspnea from her COPD/lung cancer, but has not had any change to that. No claudication.  No TIA or amaurosis fugax..   ROS: A comprehensive was performed. Review of Systems  Constitutional: Negative for malaise/fatigue (Overall activity level seems improving) and weight loss.  HENT: Positive for congestion. Negative for nosebleeds.   Respiratory: Positive for cough, shortness of breath (At baseline) and wheezing. Negative for sputum  production (Just phlegm. Nothing suggestive of infectious etiology).   Cardiovascular:       Per history of present illness  Gastrointestinal: Negative for blood in stool and melena.  Genitourinary: Negative for hematuria.  Musculoskeletal: Positive for joint pain. Negative for falls and myalgias.  Skin: Negative.   Neurological:       Mildly unsteady gait. Walks with a cane  Endo/Heme/Allergies: Positive for environmental allergies.  Psychiatric/Behavioral: Positive for memory loss. The patient is not nervous/anxious.   All other systems reviewed and are negative.   I have reviewed and (if needed) personally updated the patient's problem list, medications, allergies, past medical and surgical history, social and family history.   Past Medical History:  Diagnosis Date  . Age-related nuclear cataract of both eyes 2016   +cortical age related cataracts OU   . Allergy    seasonal  . Arthritis    hands  . Bilateral diabetic retinopathy (HCC) 2015   Dr. Zigmund Daniel  . Blood transfusion without reported diagnosis    when taking chemo  . CKD (chronic kidney disease), stage II    GFR 60s  . 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  . Concussion 08/25/2018   w/out loss of consciousness--mild increase in baseline memory impairment after.  CT head neg acute.  Marland Kitchen COPD (chronic obstructive pulmonary disease) ([place])   . CVA (cerebral vascular accident) ([place]) 09/2016   MRI did show a  left-sided small ischemic stroke which was acute but likely incidental (MRI was done b/c pt had TIA sx's in hospital).  Neuro put her on plavix at that time.  Cardiology d/c'd her plavix 08/2017.  . Diabetes mellitus with complication (HCC)    diab retinopathy OU (laser)  . GERD (gastroesophageal reflux disease)    protonix  . History of diverticulitis of colon    with abscess; required IR percutaneous drain placed 09/2016.  Marland Kitchen Hyperlipidemia   . Hypertension   . Left bundle  branch block (LBBB) 12/16/2016  . Lung cancer ([place])    non-small cell lung ca, stage III in 05/2011; systemic chemotherapy concurrent with radiation followed by prophylactic cranial irradiation and has been observation since July of 2010 with no evidence for disease recurrence-released from onc f/u 12/2014 (needs annual cxr by PCP).  CXR 08/2015 stable.  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 Earlie Server did bx and this was NEG for malignancy.  . Mild cognitive impairment with memory loss    Likely from brain radiation therapy  . Non-obstructive CAD (coronary artery disease)    a. Cath 2006 preserved LV fxn, scattered irregularities without critical stenosis; b. 2008 stress echo negative for ischemia, but with hypertensive response; c. 08/2016 Cath: D1 25%, otw nl.  . Normocytic anemia 2018   Iron, vit B12, folate all normal 12/2016.  Marland Kitchen Open-angle glaucoma 2016   Dr. Shirley Muscat, (bilateral)---responding to topical therapy  . Osteoporosis 03/2016; 03/2018   03/2016 DEXA T-score -2.2.  03/2018 T-score L femoral neck  -2.7--boniva started.  . Pneumonia    hospitalization 11/2016; hypoxemic resp failure--d/c'd home with home oxygen therapy  . Subclinical hyperthyroidism 2018  . Takotsubo cardiomyopathy    a. 08/2016 Echo: EF 30-35%, gr1DD, PASP 31mHg;  b. 08/2016 Cath: nonobs Dzs;  c. 11/2016 Echo: EF 40-45%, no rwma, nl RV fxn, mild TR, PASP 370mg.  ECHO 02/2017 EF 50-55%, grd II DD---essentially resolved Takotsubo 02/2017.  . [place]   a. 08/2016 in setting of diverticulitis & pneumoperitoneum and Takotsubo CM -->prolonged QT, seen by EP-->avoid meds with potential for QT prolongation.     HOSPITALIZED November 2017: diverticulitis and pneumoperitoneum with sepsis. In the setting of significant pain and sepsis she had an episode of Torsade de Pointes VT and underwent CPR with defibrillation 2. --> Initial echo revealed EF 30-35% with anterior hypokinesis --> urgent catheterization  revealed nonocclusive CAD with wall motion suggestive of Takotsubo Cardiomyopathy --> not discharged on LifeVest. Was placed on optimal medical management once her blood pressure stabilized.  Past Surgical History:  Procedure Laterality Date  . ABDOMINAL HYSTERECTOMY  1997  . APPENDECTOMY    . CARDIAC CATHETERIZATION N/A 09/14/2016   Procedure: Left Heart Cath and Coronary Angiography;  Surgeon: ThTroy SineMD;  [facility];  Service: Cardiovascular; Mild nonobstructive CAD with 85% smooth narrowing in the first diagonal branch of the LAD; 10% smooth narrowing of the ostial proximal left circumflex coronary artery; and a normal dominant RCA.  . Marland KitchenARPAL TUNNEL RELEASE    . CATARACT EXTRACTION    . CHOLECYSTECTOMY  2002  . COLONOSCOPY  10/24/00   normal.  [employer] hemoccult testing via [place]/25/16 was NEG  . IR GENERIC HISTORICAL  09/19/2016   IR SINUS/FIST TUBE CHK-NON GI 09/19/2016 JaCorrie MckusickDO [facility]  . IR GENERIC HISTORICAL  12/05/2016   Fistula smaller/improving.  IR SINUS/FIST TUBE CHK-NON GI 12/05/2016 JoSandi MariscalMD [facility]  . IR  GENERIC HISTORICAL  12/22/2016   IR RADIOLOGIST EVAL & MGMT 12/22/2016 Greggory Keen, MD [facility]  . IR RADIOLOGIST EVAL & MGMT  02/09/2017  . IR SINUS/FIST TUBE CHK-NON GI  03/28/2017  . LEFT HEART CATHETERIZATION WITH CORONARY ANGIOGRAM N/A 05/30/2012   Procedure: LEFT HEART CATHETERIZATION WITH CORONARY ANGIOGRAM;  Surgeon: Burnell Blanks, MD;  Location: [facility] CATH LAB;  Service: Cardiovascular: Mild, non-obstructive CAD  . TRANSTHORACIC ECHOCARDIOGRAM  09/14/2016; 11/2016; 02/2017   08/2016: EF 30-35 %, Akinesis of the mid-apicalanteroseptal myocardium.  Grade I DD.  Mild pulm HTN.  Repeat echo 11/2016, EF 40-45%, no rwma, nl RV fxn, mild TR, PASP 17mHg.  02/2017 EF 50-55%, grade II DD    Current Meds  Medication Sig  . acetaminophen (TYLENOL) 500 MG tablet Take 500-1,000 mg by mouth every 6 (six) hours as needed  for headache (or pain).   .Marland Kitchenalbuterol (PROAIR HFA) 108 (90 Base) MCG/ACT inhaler Inhale 2 puffs into the lungs every 6 (six) hours as needed for wheezing or shortness of breath.  .Marland Kitchenalbuterol (PROVENTIL) (2.5 MG/3ML) 0.083% nebulizer solution USE ONE VIAL IN NEBULIZER EVERY 4 HOURS AS NEEDED FOR WHEEZING OR SHORTNESS OF BREATH  . aspirin 81 MG chewable tablet Chew 81 mg by mouth daily.  .Marland Kitchenatorvastatin (LIPITOR) 80 MG tablet Take 0.5 tablets (40 mg total) by mouth daily.  . cetirizine (ZYRTEC) 10 MG tablet Take 10 mg by mouth daily.  .Marland Kitchenescitalopram (LEXAPRO) 20 MG tablet Take 1 tablet (20 mg total) by mouth daily.  . fluticasone (FLOVENT HFA) 220 MCG/ACT inhaler Inhale 2 puffs into the lungs 2 (two) times daily.  . furosemide (LASIX) 20 MG tablet Take 1 tablet (20 mg total) by mouth every other day.  . ibandronate (BONIVA) 150 MG tablet Take 1 tablet (150 mg total) by mouth every 30 (thirty) days. Take in the morning with a full glass of water, on an empty stomach, and do not take anything else by mouth or lie down for the next 30 min.  . Insulin Isophane & Regular Human (HUMULIN 70/30 KWIKPEN) (70-30) 100 UNIT/ML PEN Take a base dose of 9 U SQ at breakfast and 7 U SQ at evening meal + add additional units PER SLIDING SCALE EVERY MORNING AND EVENING 140-199 2UNITS, 200-250 4 UNITS, 251-299 6 UNITS, 300-349 8 UNITS, 350 OR ABOVE 10 UNITS  . metFORMIN (GLUCOPHAGE) 500 MG tablet Take 1 tablet (500 mg total) by mouth 2 (two) times daily with a meal.  . metoprolol tartrate (LOPRESSOR) 25 MG tablet Take 25 mg by mouth 2 (two) times daily. Patient reports taking 1 tablet(258m in the morning and 2 tablets(5041mat bedtime  . ONE TOUCH ULTRA TEST test strip USE  STRIP TO CHECK GLUCOSE THREE TIMES DAILY  . ONETOUCH DELICA LANCETS [id number] USE 1  TO CHECK GLUCOSE THREE TIMES DAILY  . pantoprazole (PROTONIX) 40 MG tablet TAKE 1 TABLET BY MOUTH ONCE DAILY  . potassium chloride (K-DUR) 10 MEQ tablet TAKE 1 TABLET  BY MOUTH ONCE DAILY  . [DISCONTINUED] atorvastatin (LIPITOR) 80 MG tablet TAKE 1 TABLET BY MOUTH ONCE DAILY  . [DISCONTINUED] furosemide (LASIX) 20 MG tablet TAKE 1 TABLET BY MOUTH ONCE DAILY    Allergies  Allergen Reactions  . Lactose Intolerance (Gi) Diarrhea  . Ibuprofen Other (See Comments)    REACTION: Anxiousness, hyperventilates    Social History   Tobacco Use  . Smoking status: Former Smoker    Packs/day: 1.00  Years: 45.00    Pack years: 45.00    Types: Cigarettes    Last attempt to quit: 10/24/2008    Years since quitting: 9.9  . Smokeless tobacco: Never Used  Substance Use Topics  . Alcohol use: No    Alcohol/week: 0.0 standard drinks  . Drug use: No   Social History   Social History Narrative   Lives in [place], has husband and daughter and son. Daughter's family is living with her and husband at present. She is ambulatory daily without cane or walker.      family history includes Diabetes in her brother, father, and mother; Heart attack in her brother, brother, and brother; Heart disease in her brother; Heart failure in her mother; Kidney disease in her mother.  Wt Readings from Last 3 Encounters:  09/19/18 157 lb 6.4 oz (71.4 kg)  08/28/18 159 lb (72.1 kg)  08/10/18 160 lb (72.6 kg)    PHYSICAL EXAM BP 112/60   Pulse 84   Ht 5' 2" (1.575 m)   Wt 157 lb 6.4 oz (71.4 kg)   SpO2 97%   BMI 28.79 kg/m   Physical Exam  Constitutional: She is oriented to person, place, and time. She appears well-developed and well-nourished. No distress.  Somewhat chronically ill-appearing.  Well-groomed.  HENT:  Head: Normocephalic and atraumatic.  Neck: Normal range of motion. Neck supple. No hepatojugular reflux and no JVD present. Carotid bruit is not present.  Cardiovascular: Normal rate, regular rhythm, normal heart sounds and intact distal pulses.  No extrasystoles are present. PMI is not displaced. Exam reveals no gallop and no friction rub.  No murmur  heard. Pulmonary/Chest: Effort normal. No respiratory distress. She has no rales. She exhibits tenderness (Much less notable.).  Diffuse coarse rhonchi and wheezing.  No rales.  Minimal clearing with cough.  Abdominal: Soft. Bowel sounds are normal. She exhibits no distension. There is no tenderness. There is no rebound.  No HSM  Musculoskeletal: Normal range of motion. She exhibits no edema.  Walks with a walker/cane with mildly unsteady gait.  Neurological: She is alert and oriented to person, place, and time.  Psychiatric: She has a normal mood and affect. Her behavior is normal. Judgment and thought content normal.  Nursing note and vitals reviewed.    Adult ECG Report N/a   Other studies Reviewed: Additional studies/ records that were reviewed today include:  Recent Labs:   Lab Results  Component Value Date   CREATININE 1.05 (H) 09/19/2018   BUN 16 09/19/2018   NA 141 09/19/2018   K 4.8 09/19/2018   CL 99 09/19/2018   CO2 23 09/19/2018   Lab Results  Component Value Date   CHOL 111 09/19/2018   HDL 34 (L) 09/19/2018   LDLCALC 48 09/19/2018   LDLDIRECT 162.0 07/17/2015   TRIG 147 09/19/2018   CHOLHDL 3.3 09/19/2018   ASSESSMENT / PLAN: Problem List Items Addressed This Visit    Chronic diastolic (congestive) heart failure (HCC) (Chronic)    Pretty much stable now euvolemic.  Back off Lasix to every other day and as needed.      Relevant Medications   metoprolol tartrate (LOPRESSOR) 25 MG tablet   furosemide (LASIX) 20 MG tablet   atorvastatin (LIPITOR) 80 MG tablet   Essential hypertension (Chronic)    Doubt this is true diagnosis.  Well-controlled on low-dose beta-blocker.      Relevant Medications   metoprolol tartrate (LOPRESSOR) 25 MG tablet   furosemide (LASIX) 20 MG tablet  atorvastatin (LIPITOR) 80 MG tablet   Hyperlipidemia (Chronic)    Remains on high-dose statin.  Should have labs checked soon.  Would plan to go ahead and reduce atorvastatin 40  mg.      Relevant Medications   metoprolol tartrate (LOPRESSOR) 25 MG tablet   furosemide (LASIX) 20 MG tablet   atorvastatin (LIPITOR) 80 MG tablet   Other Relevant Orders   Lipid panel (Completed)   Comprehensive metabolic panel (Completed)   Lipid panel (Completed)   Takotsubo cardiomyopathy - Primary (Chronic)    Resolved with normalization of EF.  No longer having any heart failure symptoms. Okay to reduce Lasix to every other day. Is on low-dose metoprolol only. Blood pressure not high enough to add ACE inhibitor or ARB.      Relevant Medications   metoprolol tartrate (LOPRESSOR) 25 MG tablet   furosemide (LASIX) 20 MG tablet   atorvastatin (LIPITOR) 80 MG tablet   Other Relevant Orders   Comprehensive metabolic panel (Completed)      Current medicines are reviewed at length with the patient today. (+/- concerns) n/a  The following changes have been made: n/a   Patient Instructions  Medication Instructions:   Decrease  Atorvastatin to 40 mg daily from 80 mg    Decrease lasix(furosemide) 20 mg  - start taking every other day   If you need a refill on your cardiac medications before your next appointment, please call your pharmacy.   Lab work: CMP LIPID-TODAY If you have labs (blood work) drawn today and your tests are completely normal, you will receive your results only by: Marland Kitchen MyChart Message (if you have MyChart) OR . A paper copy in the mail If you have any lab test that is abnormal or we need to change your treatment, we will call you to review the results.  Testing/Procedures: NOT NEEDED  Follow-Up: At [facility], you and your health needs are our priority.  As part of our continuing mission to provide you with exceptional heart care, we have created designated Provider Care Teams.  These Care Teams include your primary Cardiologist (physician) and Advanced Practice Providers (APPs -  Physician Assistants and Nurse Practitioners) who all work together  to provide you with the care you need, when you need it.  . Your physician recommends that you schedule a follow-up appointment on an as needed basis. .   Any Other Special Instructions Will Be Listed Below (If Applicable)     Studies Ordered:   Orders Placed This Encounter  Procedures  . Lipid panel  . Comprehensive metabolic panel     Glenetta Hew, M.D., M.S. Interventional Cardiologist   Pager # [phone] Phone # [phone] [address] [place], [place] [address]

## 2018-09-19 NOTE — Assessment & Plan Note (Signed)
Remains on high-dose statin.  Should have labs checked soon.  Would plan to go ahead and reduce atorvastatin 40 mg.

## 2018-09-19 NOTE — Patient Instructions (Addendum)
Medication Instructions:   Decrease  Atorvastatin to 40 mg daily from 80 mg    Decrease lasix(furosemide) 20 mg  - start taking every other day   If you need a refill on your cardiac medications before your next appointment, please call your pharmacy.   Lab work: CMP LIPID-TODAY If you have labs (blood work) drawn today and your tests are completely normal, you will receive your results only by: Marland Kitchen MyChart Message (if you have MyChart) OR . A paper copy in the mail If you have any lab test that is abnormal or we need to change your treatment, we will call you to review the results.  Testing/Procedures: NOT NEEDED  Follow-Up: At [facility], you and your health needs are our priority.  As part of our continuing mission to provide you with exceptional heart care, we have created designated Provider Care Teams.  These Care Teams include your primary Cardiologist (physician) and Advanced Practice Providers (APPs -  Physician Assistants and Nurse Practitioners) who all work together to provide you with the care you need, when you need it.  . Your physician recommends that you schedule a follow-up appointment on an as needed basis. .   Any Other Special Instructions Will Be Listed Below (If Applicable)

## 2018-09-19 NOTE — Assessment & Plan Note (Signed)
Doubt this is true diagnosis.  Well-controlled on low-dose beta-blocker.

## 2018-10-14 ENCOUNTER — Other Ambulatory Visit: Payer: Self-pay | Admitting: Cardiology

## 2018-10-23 ENCOUNTER — Other Ambulatory Visit: Payer: Self-pay | Admitting: *Deleted

## 2018-10-23 DIAGNOSIS — E118 Type 2 diabetes mellitus with unspecified complications: Secondary | ICD-10-CM

## 2018-10-23 DIAGNOSIS — Z794 Long term (current) use of insulin: Secondary | ICD-10-CM

## 2018-10-23 MED ORDER — INSULIN ISOPHANE & REGULAR (HUMAN 70-30)100 UNIT/ML KWIKPEN: PEN_INJECTOR | SUBCUTANEOUS | 1 refills | Status: DC

## 2018-10-28 ENCOUNTER — Other Ambulatory Visit: Payer: Self-pay | Admitting: Family Medicine

## 2018-11-12 ENCOUNTER — Ambulatory Visit (INDEPENDENT_AMBULATORY_CARE_PROVIDER_SITE_OTHER): Payer: Medicare Other

## 2018-11-12 ENCOUNTER — Encounter: Payer: Self-pay | Admitting: Family Medicine

## 2018-11-12 ENCOUNTER — Ambulatory Visit (INDEPENDENT_AMBULATORY_CARE_PROVIDER_SITE_OTHER): Payer: Medicare Other | Admitting: Family Medicine

## 2018-11-12 VITALS — BP 129/69 | HR 78 | Temp 97.6°F | Resp 16 | Ht 62.0 in | Wt 157.1 lb

## 2018-11-12 DIAGNOSIS — Z0000 Encounter for general adult medical examination without abnormal findings: Secondary | ICD-10-CM | POA: Diagnosis not present

## 2018-11-12 DIAGNOSIS — E663 Overweight: Secondary | ICD-10-CM | POA: Diagnosis not present

## 2018-11-12 DIAGNOSIS — N183 Chronic kidney disease, stage 3 unspecified: Secondary | ICD-10-CM

## 2018-11-12 DIAGNOSIS — Z794 Long term (current) use of insulin: Secondary | ICD-10-CM | POA: Diagnosis not present

## 2018-11-12 DIAGNOSIS — F331 Major depressive disorder, recurrent, moderate: Secondary | ICD-10-CM | POA: Diagnosis not present

## 2018-11-12 DIAGNOSIS — E1140 Type 2 diabetes mellitus with diabetic neuropathy, unspecified: Secondary | ICD-10-CM | POA: Diagnosis not present

## 2018-11-12 DIAGNOSIS — I10 Essential (primary) hypertension: Secondary | ICD-10-CM | POA: Diagnosis not present

## 2018-11-12 DIAGNOSIS — Z23 Encounter for immunization: Secondary | ICD-10-CM

## 2018-11-12 LAB — BASIC METABOLIC PANEL
BUN: 13 mg/dL (ref 6–23)
CALCIUM: 9.1 mg/dL (ref 8.4–10.5)
CO2: 28 meq/L (ref 19–32)
CREATININE: 1.05 mg/dL (ref 0.40–1.20)
Chloride: 102 mEq/L (ref 96–112)
GFR: 51.75 mL/min — AB (ref >60.00)
GLUCOSE: 124 mg/dL — AB (ref 70–99)
Potassium: 4.9 mEq/L (ref 3.5–5.1)
Sodium: 140 mEq/L (ref 135–145)

## 2018-11-12 LAB — HEMOGLOBIN A1C: HEMOGLOBIN A1C: 7.3 % — AB (ref 4.6–6.5)

## 2018-11-12 MED ORDER — BUPROPION HCL ER (XL) 150 MG PO TB24: 150.0000 mg | ORAL_TABLET | Freq: Every day | ORAL | 0 refills | Status: DC

## 2018-11-12 MED ORDER — ZOSTER VAC RECOMB ADJUVANTED 50 MCG/0.5ML IM SUSR: 0.5000 mL | Freq: Once | INTRAMUSCULAR | 1 refills | Status: AC

## 2018-11-12 MED ORDER — ESCITALOPRAM OXALATE 20 MG PO TABS: ORAL_TABLET | ORAL | 3 refills | Status: DC

## 2018-11-12 NOTE — Progress Notes (Signed)
OFFICE VISIT  11/12/2018   CC:  Chief Complaint  Patient presents with  . Follow-up    RCI, pt is fasting.     HPI:    Patient is a 73 y.o. Caucasian female who presents for 3 mo f/u DM, HTN, CRI III, MDD.  DM 2:  Fluctuate a lot due to dietary indiscretion.  Fastings approx 140s avg.  No hypoglycemia. Before supper gluc's a50s-160s avg but anywhere from 110 up to mid 200s.  HTN: no home bp monitoring being done.    CRI III: no NSAIDs.  She has NOT cut back on her coffee any.  Has cut back on sodas a little. Not a good water drinker.  DEP: Three months ago I increased her lexapro from5m qd to 211mqd. She is not changed.  She lies on couch or in bed all day, watches TV.  Acts immature, is irritable all the time. Per her report she would not ever get out of bed if she wasn't forced to.  Endorses thoughts that she would be better off dead but denies any thoughts of hurting herself.    Of note, her cardiologist cut her atorva down to 1/2 of 8067mab qd.  Needs 39m58mbs rx.  Past Medical History:  Diagnosis Date  . Age-related nuclear cataract of both eyes 2016   +cortical age related cataracts OU   . Allergy    seasonal  . Arthritis    hands  . Bilateral diabetic retinopathy (HCC) 2015   Dr. MattZigmund DanielBlood transfusion without reported diagnosis    when taking chemo  . CKD (chronic kidney disease), stage II    GFR 60s  . 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  . Concussion 08/25/2018   w/out loss of consciousness--mild increase in baseline memory impairment after.  CT head neg acute.  . COMarland KitchenD (chronic obstructive pulmonary disease) (HCC)[place]. CVA (cerebral vascular accident) (HCC)[place]/2017   MRI did show a left-sided small ischemic stroke which was acute but likely incidental (MRI was done b/c pt had TIA sx's in hospital).  Neuro put her on plavix at that time.  Cardiology d/c'd her plavix 08/2017.  . Diabetes mellitus with  complication (HCC)    diab retinopathy OU (laser)  . GERD (gastroesophageal reflux disease)    protonix  . History of diverticulitis of colon    with abscess; required IR percutaneous drain placed 09/2016.  . HyMarland Kitchenerlipidemia   . Hypertension   . Left bundle branch block (LBBB) 12/16/2016  . Lung cancer (HCC)[place] non-small cell lung ca, stage III in 05/2011; systemic chemotherapy concurrent with radiation followed by prophylactic cranial irradiation and has been observation since July of 2010 with no evidence for disease recurrence-released from onc f/u 12/2014 (needs annual cxr by PCP).  CXR 08/2015 stable.  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 MohaEarlie Server bx and this was NEG for malignancy.  . Mild cognitive impairment with memory loss    Likely from brain radiation therapy  . Non-obstructive CAD (coronary artery disease)    a. Cath 2006 preserved LV fxn, scattered irregularities without critical stenosis; b. 2008 stress echo negative for ischemia, but with hypertensive response; c. 08/2016 Cath: D1 25%, otw nl.  . Normocytic anemia 2018   Iron, vit B12, folate all normal 12/2016.  . OpMarland Kitchenn-angle glaucoma 2016   Dr. BernShirley Muscatilateral)---responding to topical therapy  . Osteoporosis 03/2016; 03/2018   03/2016 DEXA T-score -  2.2.  03/2018 T-score L femoral neck  -2.7--boniva started.  . Pneumonia    hospitalization 11/2016; hypoxemic resp failure--d/c'd home with home oxygen therapy  . Subclinical hyperthyroidism 2018  . Takotsubo cardiomyopathy    a. 08/2016 Echo: EF 30-35%, gr1DD, PASP 20mHg;  b. 08/2016 Cath: nonobs Dzs;  c. 11/2016 Echo: EF 40-45%, no rwma, nl RV fxn, mild TR, PASP 315mg.  ECHO 02/2017 EF 50-55%, grd II DD---essentially resolved Takotsubo 02/2017.  . [place] de pointes ([place]11/2017   a. 08/2016 in setting of diverticulitis & pneumoperitoneum and Takotsubo CM -->prolonged QT, seen by EP-->avoid meds with potential for QT prolongation.    Past Surgical History:  Procedure  Laterality Date  . ABDOMINAL HYSTERECTOMY  1997  . APPENDECTOMY    . CARDIAC CATHETERIZATION N/A 09/14/2016   Procedure: Left Heart Cath and Coronary Angiography;  Surgeon: ThTroy SineMD;  [facility];  Service: Cardiovascular; Mild nonobstructive CAD with 85% smooth narrowing in the first diagonal branch of the LAD; 10% smooth narrowing of the ostial proximal left circumflex coronary artery; and a normal dominant RCA.  . Marland KitchenARPAL TUNNEL RELEASE    . CATARACT EXTRACTION    . CHOLECYSTECTOMY  2002  . COLONOSCOPY  10/24/00   normal.  [employer] hemoccult testing via [place]/25/16 was NEG  . dexa  03/2016; 03/2018   03/2018 T-score L femoral neck  -2.7 (worsened compared to osteopenic range 2017.)  . IR GENERIC HISTORICAL  09/19/2016   IR SINUS/FIST TUBE CHK-NON GI 09/19/2016 JaCorrie MckusickDO [facility]  . IR GENERIC HISTORICAL  10/06/2016   IR RADIOLOGIST EVAL & MGMT 10/06/2016 [facility]  . IR GENERIC HISTORICAL  10/26/2016   IR RADIOLOGIST EVAL & MGMT 10/26/2016 [facility]  . IR GENERIC HISTORICAL  11/03/2016   IR RADIOLOGIST EVAL & MGMT 11/03/2016 WeArdis RowanPA-C [facility]  . IR GENERIC HISTORICAL  11/24/2016   IR RADIOLOGIST EVAL & MGMT 11/24/2016 WeArdis RowanPA-C [facility]  . IR GENERIC HISTORICAL  12/05/2016   Fistula smaller/improving.  IR SINUS/FIST TUBE CHK-NON GI 12/05/2016 JoSandi MariscalMD [facility]  . IR GENERIC HISTORICAL  12/22/2016   IR RADIOLOGIST EVAL & MGMT 12/22/2016 MiGreggory KeenMD [facility]  . IR RADIOLOGIST EVAL & MGMT  01/10/2017  . IR RADIOLOGIST EVAL & MGMT  02/09/2017  . IR SINUS/FIST TUBE CHK-NON GI  03/28/2017  . LEFT HEART CATHETERIZATION WITH CORONARY ANGIOGRAM N/A 05/30/2012   Procedure: LEFT HEART CATHETERIZATION WITH CORONARY ANGIOGRAM;  Surgeon: ChBurnell BlanksMD;  [facility];  Service: Cardiovascular: Mild, non-obstructive CAD  . TRANSTHORACIC ECHOCARDIOGRAM  09/14/2016; 11/2016    08/2016: EF 30-35 %, Akinesis of the mid-apicalanteroseptal myocardium.  Grade I DD.  Mild pulm HTN.  Repeat echo 11/2016, EF 40-45%, no rwma, nl RV fxn, mild TR, PASP 3731m.    . TMarland KitchenANSTHORACIC ECHOCARDIOGRAM  03/07/2017   EF 50-55%. Hypokinesis of the distal septum with overall low normal LV,  systolic function; mild diastolic dysfunction with elevated LV  filling pressure; mildly calcified aortic valve with mild AI; small pericardial effusion    Outpatient Medications Prior to Visit  Medication Sig Dispense Refill  . acetaminophen (TYLENOL) 500 MG tablet Take 500-1,000 mg by mouth every 6 (six) hours as needed for headache (or pain).     . aMarland Kitchenbuterol (PROAIR HFA) 108 (90 Base) MCG/ACT inhaler Inhale 2 puffs into the lungs every 6 (six) hours  as needed for wheezing or shortness of breath. 18 g 3  . albuterol (PROVENTIL) (2.5 MG/3ML) 0.083% nebulizer solution USE ONE VIAL IN NEBULIZER EVERY 4 HOURS AS NEEDED FOR WHEEZING OR SHORTNESS OF BREATH 75 mL 3  . aspirin 81 MG chewable tablet Chew 81 mg by mouth daily.    Marland Kitchen atorvastatin (LIPITOR) 80 MG tablet Take 0.5 tablets (40 mg total) by mouth daily. 90 tablet 1  . cetirizine (ZYRTEC) 10 MG tablet Take 10 mg by mouth daily.    . fluticasone (FLOVENT HFA) 220 MCG/ACT inhaler Inhale 2 puffs into the lungs 2 (two) times daily. 12 g 6  . furosemide (LASIX) 20 MG tablet Take 1 tablet (20 mg total) by mouth every other day. 90 tablet 1  . ibandronate (BONIVA) 150 MG tablet Take 1 tablet (150 mg total) by mouth every 30 (thirty) days. Take in the morning with a full glass of water, on an empty stomach, and do not take anything else by mouth or lie down for the next 30 min. 3 tablet 3  . Insulin Isophane & Regular Human (HUMULIN 70/30 KWIKPEN) (70-30) 100 UNIT/ML PEN Take a base dose of 9 U SQ at breakfast and 9 U SQ at evening meal + add additional units PER SLIDING SCALE EVERY MORNING AND EVENING 140-199 2UNITS, 200-250 4 UNITS, 251-299 6 UNITS, 300-349 8  UNITS, 350 OR ABOVE 10 UNITS 15 pen 1  . metFORMIN (GLUCOPHAGE) 500 MG tablet TAKE 1 TABLET BY MOUTH TWICE DAILY WITH A MEAL 180 tablet 0  . metoprolol tartrate (LOPRESSOR) 25 MG tablet TAKE 1 TABLET BY MOUTH IN THE MORNING AND TAKE 2 TABLETS BY MOUTH AT BEDTIME 270 tablet 1  . ONE TOUCH ULTRA TEST test strip USE  STRIP TO CHECK GLUCOSE THREE TIMES DAILY 100 each 11  . ONETOUCH DELICA LANCETS 62X MISC USE 1  TO CHECK GLUCOSE THREE TIMES DAILY 100 each 11  . pantoprazole (PROTONIX) 40 MG tablet TAKE 1 TABLET BY MOUTH ONCE DAILY 90 tablet 3  . potassium chloride (K-DUR) 10 MEQ tablet TAKE 1 TABLET BY MOUTH ONCE DAILY 90 tablet 1  . escitalopram (LEXAPRO) 20 MG tablet Take 1 tablet (20 mg total) by mouth daily. 30 tablet 3   No facility-administered medications prior to visit.     Allergies  Allergen Reactions  . Lactose Intolerance (Gi) Diarrhea  . Ibuprofen Other (See Comments)    REACTION: Anxiousness, hyperventilates    ROS As per HPI  PE: Blood pressure 129/69, pulse 78, temperature 97.6 F (36.4 C), temperature source Oral, resp. rate 16, height _0  (1.575 m), weight 157 lb 2 oz (71.3 kg), SpO2 98 %. Gen: Alert, well appearing.  Patient is oriented to person, place, time, and situation. AFFECT: pleasant, lucid thought and speech. CV: RRR, no m/r/g.   LUNGS: CTA bilat, nonlabored resps, good aeration in all lung fields. EXT: no clubbing or cyanosis.  no edema.    LABS:  Lab Results  Component Value Date   TSH 1.24 04/20/2017   Lab Results  Component Value Date   WBC 7.0 08/25/2018   HGB 11.1 (L) 08/25/2018   HCT 36.9 08/25/2018   MCV 94.1 08/25/2018   PLT 215 08/25/2018   Lab Results  Component Value Date   CREATININE 1.05 (H) 09/19/2018   BUN 16 09/19/2018   NA 141 09/19/2018   K 4.8 09/19/2018   CL 99 09/19/2018   CO2 23 09/19/2018   Lab Results  Component Value Date  ALT 17 09/19/2018   AST 20 09/19/2018   ALKPHOS 92 09/19/2018   BILITOT 0.3  09/19/2018   Lab Results  Component Value Date   CHOL 111 09/19/2018   Lab Results  Component Value Date   HDL 34 (L) 09/19/2018   Lab Results  Component Value Date   LDLCALC 48 09/19/2018   Lab Results  Component Value Date   TRIG 147 09/19/2018   Lab Results  Component Value Date   CHOLHDL 3.3 09/19/2018   Lab Results  Component Value Date   HGBA1C 7.3 (H) 08/10/2018    IMPRESSION AND PLAN:  1) DM 2, control as per her usual--pretty erratic but nothing horrible. A1c, BMET. No changes in meds at this time.  2) HTN: The current medical regimen is effective;  continue present plan and medications. Encouraged pt's daughter to check pt's bp a couple times per week.  3) CRI III: she always needs encouragement to drink more water, less coffee/colas. She does avoid NSAIDs. BMET today.  OK to continue metformin as long as her GFR remains above 45 ml/min.  4) MDD: not improved with increase of lexapro to 27m qd. Winter time, hard to get out of the house.  She has been difficult to treat regarding this problem. Decided to cut lexapro back to 125mqd and add wellbutrin xl 15074md.  An After Visit Summary was printed and given to the patient.  FOLLOW UP: Return in about 4 weeks (around 12/10/2018) for f/u depression. (needs cpe 3 mo)  Signed:  PhiCrissie SicklesD           11/12/2018

## 2018-11-12 NOTE — Patient Instructions (Addendum)
Shingles vaccine at pharmacy.   Continue doing brain stimulating activities (puzzles, reading, adult coloring books, staying active) to keep memory sharp.   Bring a copy of your living will and/or healthcare power of attorney to your next office visit.  Health Maintenance, Female Adopting a healthy lifestyle and getting preventive care can go a long way to promote health and wellness. Talk with your health care provider about what schedule of regular examinations is right for you. This is a good chance for you to check in with your provider about disease prevention and staying healthy. In between checkups, there are plenty of things you can do on your own. Experts have done a lot of research about which lifestyle changes and preventive measures are most likely to keep you healthy. Ask your health care provider for more information. Weight and diet Eat a healthy diet  Be sure to include plenty of vegetables, fruits, low-fat dairy products, and lean protein.  Do not eat a lot of foods high in solid fats, added sugars, or salt.  Get regular exercise. This is one of the most important things you can do for your health. ? Most adults should exercise for at least 150 minutes each week. The exercise should increase your heart rate and make you sweat (moderate-intensity exercise). ? Most adults should also do strengthening exercises at least twice a week. This is in addition to the moderate-intensity exercise. Maintain a healthy weight  Body mass index (BMI) is a measurement that can be used to identify possible weight problems. It estimates body fat based on height and weight. Your health care provider can help determine your BMI and help you achieve or maintain a healthy weight.  For females 65 years of age and older: ? A BMI below 18.5 is considered underweight. ? A BMI of 18.5 to 24.9 is normal. ? A BMI of 25 to 29.9 is considered overweight. ? A BMI of 30 and above is considered obese. Watch  levels of cholesterol and blood lipids  You should start having your blood tested for lipids and cholesterol at 71 years of age, then have this test every 5 years.  You may need to have your cholesterol levels checked more often if: ? Your lipid or cholesterol levels are high. ? You are older than 71 years of age. ? You are at high risk for heart disease. Cancer screening Lung Cancer  Lung cancer screening is recommended for adults 62-55 years old who are at high risk for lung cancer because of a history of smoking.  A yearly low-dose CT scan of the lungs is recommended for people who: ? Currently smoke. ? Have quit within the past 15 years. ? Have at least a 30-pack-year history of smoking. A pack year is smoking an average of one pack of cigarettes a day for 1 year.  Yearly screening should continue until it has been 15 years since you quit.  Yearly screening should stop if you develop a health problem that would prevent you from having lung cancer treatment. Breast Cancer  Practice breast self-awareness. This means understanding how your breasts normally appear and feel.  It also means doing regular breast self-exams. Let your health care provider know about any changes, no matter how small.  If you are in your 20s or 30s, you should have a clinical breast exam (CBE) by a health care provider every 1-3 years as part of a regular health exam.  If you are 53 or older, have a  CBE every year. Also consider having a breast X-ray (mammogram) every year.  If you have a family history of breast cancer, talk to your health care provider about genetic screening.  If you are at high risk for breast cancer, talk to your health care provider about having an MRI and a mammogram every year.  Breast cancer gene (BRCA) assessment is recommended for women who have family members with BRCA-related cancers. BRCA-related cancers include: ? Breast. ? Ovarian. ? Tubal. ? Peritoneal  cancers.  Results of the assessment will determine the need for genetic counseling and BRCA1 and BRCA2 testing. Cervical Cancer Your health care provider may recommend that you be screened regularly for cancer of the pelvic organs (ovaries, uterus, and vagina). This screening involves a pelvic examination, including checking for microscopic changes to the surface of your cervix (Pap test). You may be encouraged to have this screening done every 3 years, beginning at age 64.  For women ages 92-65, health care providers may recommend pelvic exams and Pap testing every 3 years, or they may recommend the Pap and pelvic exam, combined with testing for human papilloma virus (HPV), every 5 years. Some types of HPV increase your risk of cervical cancer. Testing for HPV may also be done on women of any age with unclear Pap test results.  Other health care providers may not recommend any screening for nonpregnant women who are considered low risk for pelvic cancer and who do not have symptoms. Ask your health care provider if a screening pelvic exam is right for you.  If you have had past treatment for cervical cancer or a condition that could lead to cancer, you need Pap tests and screening for cancer for at least 20 years after your treatment. If Pap tests have been discontinued, your risk factors (such as having a new sexual partner) need to be reassessed to determine if screening should resume. Some women have medical problems that increase the chance of getting cervical cancer. In these cases, your health care provider may recommend more frequent screening and Pap tests. Colorectal Cancer  This type of cancer can be detected and often prevented.  Routine colorectal cancer screening usually begins at 71 years of age and continues through 71 years of age.  Your health care provider may recommend screening at an earlier age if you have risk factors for colon cancer.  Your health care provider may also  recommend using home test kits to check for hidden blood in the stool.  A small camera at the end of a tube can be used to examine your colon directly (sigmoidoscopy or colonoscopy). This is done to check for the earliest forms of colorectal cancer.  Routine screening usually begins at age 24.  Direct examination of the colon should be repeated every 5-10 years through 71 years of age. However, you may need to be screened more often if early forms of precancerous polyps or small growths are found. Skin Cancer  Check your skin from head to toe regularly.  Tell your health care provider about any new moles or changes in moles, especially if there is a change in a mole's shape or color.  Also tell your health care provider if you have a mole that is larger than the size of a pencil eraser.  Always use sunscreen. Apply sunscreen liberally and repeatedly throughout the day.  Protect yourself by wearing long sleeves, pants, a wide-brimmed hat, and sunglasses whenever you are outside. Heart disease, diabetes, and high blood  pressure  High blood pressure causes heart disease and increases the risk of stroke. High blood pressure is more likely to develop in: ? People who have blood pressure in the high end of the normal range (130-139/85-89 mm Hg). ? People who are overweight or obese. ? People who are African American.  If you are 83-21 years of age, have your blood pressure checked every 3-5 years. If you are 7 years of age or older, have your blood pressure checked every year. You should have your blood pressure measured twice-once when you are at a hospital or clinic, and once when you are not at a hospital or clinic. Record the average of the two measurements. To check your blood pressure when you are not at a hospital or clinic, you can use: ? An automated blood pressure machine at a pharmacy. ? A home blood pressure monitor.  If you are between 11 years and 80 years old, ask your health  care provider if you should take aspirin to prevent strokes.  Have regular diabetes screenings. This involves taking a blood sample to check your fasting blood sugar level. ? If you are at a normal weight and have a low risk for diabetes, have this test once every three years after 71 years of age. ? If you are overweight and have a high risk for diabetes, consider being tested at a younger age or more often. Preventing infection Hepatitis B  If you have a higher risk for hepatitis B, you should be screened for this virus. You are considered at high risk for hepatitis B if: ? You were born in a country where hepatitis B is common. Ask your health care provider which countries are considered high risk. ? Your parents were born in a high-risk country, and you have not been immunized against hepatitis B (hepatitis B vaccine). ? You have HIV or AIDS. ? You use needles to inject street drugs. ? You live with someone who has hepatitis B. ? You have had sex with someone who has hepatitis B. ? You get hemodialysis treatment. ? You take certain medicines for conditions, including cancer, organ transplantation, and autoimmune conditions. Hepatitis C  Blood testing is recommended for: ? Everyone born from [birth year] through 1963. ? Anyone with known risk factors for hepatitis C. Sexually transmitted infections (STIs)  You should be screened for sexually transmitted infections (STIs) including gonorrhea and chlamydia if: ? You are sexually active and are younger than 71 years of age. ? You are older than 71 years of age and your health care provider tells you that you are at risk for this type of infection. ? Your sexual activity has changed since you were last screened and you are at an increased risk for chlamydia or gonorrhea. Ask your health care provider if you are at risk.  If you do not have HIV, but are at risk, it may be recommended that you take a prescription medicine daily to prevent HIV  infection. This is called pre-exposure prophylaxis (PrEP). You are considered at risk if: ? You are sexually active and do not regularly use condoms or know the HIV status of your partner(s). ? You take drugs by injection. ? You are sexually active with a partner who has HIV. Talk with your health care provider about whether you are at high risk of being infected with HIV. If you choose to begin PrEP, you should first be tested for HIV. You should then be tested every 3 months for  as long as you are taking PrEP. Pregnancy  If you are premenopausal and you may become pregnant, ask your health care provider about preconception counseling.  If you may become pregnant, take 400 to 800 micrograms (mcg) of folic acid every day.  If you want to prevent pregnancy, talk to your health care provider about birth control (contraception). Osteoporosis and menopause  Osteoporosis is a disease in which the bones lose minerals and strength with aging. This can result in serious bone fractures. Your risk for osteoporosis can be identified using a bone density scan.  If you are 32 years of age or older, or if you are at risk for osteoporosis and fractures, ask your health care provider if you should be screened.  Ask your health care provider whether you should take a calcium or vitamin D supplement to lower your risk for osteoporosis.  Menopause may have certain physical symptoms and risks.  Hormone replacement therapy may reduce some of these symptoms and risks. Talk to your health care provider about whether hormone replacement therapy is right for you. Follow these instructions at home:  Schedule regular health, dental, and eye exams.  Stay current with your immunizations.  Do not use any tobacco products including cigarettes, chewing tobacco, or electronic cigarettes.  If you are pregnant, do not drink alcohol.  If you are breastfeeding, limit how much and how often you drink alcohol.  Limit  alcohol intake to no more than 1 drink per day for nonpregnant women. One drink equals 12 ounces of beer, 5 ounces of wine, or 1 ounces of hard liquor.  Do not use street drugs.  Do not share needles.  Ask your health care provider for help if you need support or information about quitting drugs.  Tell your health care provider if you often feel depressed.  Tell your health care provider if you have ever been abused or do not feel safe at home. This information is not intended to replace advice given to you by your health care provider. Make sure you discuss any questions you have with your health care provider. Document Released: 04/25/2011 Document Revised: 03/17/2016 Document Reviewed: 07/14/2015 Elsevier Interactive Patient Education  2019 [employer].

## 2018-11-12 NOTE — Progress Notes (Signed)
Subjective:   [name] is a 73 y.o. female who presents for Medicare Annual (Subsequent) preventive examination.  Review of Systems:  No ROS.  Medicare Wellness Visit. Additional risk factors are reflected in the social history.  Cardiac Risk Factors include: advanced age (>56mn, >>30women);diabetes mellitus;dyslipidemia;hypertension;family history of premature cardiovascular disease;sedentary lifestyle   Sleep patterns: Sleeps 10-12 hours.  Home Safety/Smoke Alarms: Feels safe in home. Smoke alarms in place.  Living environment; residence and Firearm Safety: Lives with husband, daughter and grandkids in 1 story home.  Seat Belt Safety/Bike Helmet: Wears seat belt.   Female:   [id number]     Mammo-04/12/2018, BI-RADS CATEGORY  1: Negative        Dexa scan-04/12/2018, Osteoporosis.         CCS-Colonoscopy 10/02/15, Benign Polyps. Recall 5 years      Objective:     Vitals: BP 129/69 (BP Location: Left Arm, Patient Position: Sitting, Cuff Size: Normal)   Pulse 78   Temp 97.6 F (36.4 C) (Oral)   Resp 16   Ht 5' 2"  (1.575 m)   Wt 157 lb 2 oz (71.3 kg)   SpO2 98%   BMI 28.74 kg/m   Body mass index is 28.74 kg/m.  Advanced Directives 11/12/2018 08/25/2018 08/23/2018 11/10/2017 09/12/2017 09/05/2017 12/02/2016  Does Patient Have a Medical Advance Directive? No Yes No Yes No No No  Type of Advance Directive - [place] - - -  Does patient want to make changes to medical advance directive? - - - - - - -  Copy of [place] Chart? - - - Yes - - -  Would patient like information on creating a medical advance directive? Yes (MAU/Ambulatory/Procedural Areas - Information given) - - - Yes (MAU/Ambulatory/Procedural Areas - Information given) - Yes (Inpatient - patient requests chaplain consult to create a medical advance directive)  Pre-existing out of facility DNR order (yellow form or pink MOST  form) - - - - - - -    Tobacco Social History   Tobacco Use  Smoking Status Former Smoker  . Packs/day: 1.00  . Years: 45.00  . Pack years: 45.00  . Types: Cigarettes  . Last attempt to quit: 10/24/2008  . Years since quitting: 10.0  Smokeless Tobacco Never Used     Counseling given: Not Answered   Past Medical History:  Diagnosis Date  . Age-related nuclear cataract of both eyes 2016   +cortical age related cataracts OU   . Allergy    seasonal  . Arthritis    hands  . Bilateral diabetic retinopathy (HCC) 2015   Dr. MZigmund Daniel . Blood transfusion without reported diagnosis    when taking chemo  . CKD (chronic kidney disease), stage II    GFR 60s  . 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  . Concussion 08/25/2018   w/out loss of consciousness--mild increase in baseline memory impairment after.  CT head neg acute.  .Marland KitchenCOPD (chronic obstructive pulmonary disease) ([place]   . CVA (cerebral vascular accident) ([place] 09/2016   MRI did show a left-sided small ischemic stroke which was acute but likely incidental (MRI was done b/c pt had TIA sx's in hospital).  Neuro put her on plavix at that time.  Cardiology d/c'd her plavix 08/2017.  . Diabetes mellitus with complication (HCC)    diab retinopathy OU (laser)  . GERD (gastroesophageal reflux disease)  protonix  . History of diverticulitis of colon    with abscess; required IR percutaneous drain placed 09/2016.  Marland Kitchen Hyperlipidemia   . Hypertension   . Left bundle branch block (LBBB) 12/16/2016  . Lung cancer ([place])    non-small cell lung ca, stage III in 05/2011; systemic chemotherapy concurrent with radiation followed by prophylactic cranial irradiation and has been observation since July of 2010 with no evidence for disease recurrence-released from onc f/u 12/2014 (needs annual cxr by PCP).  CXR 08/2015 stable.  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 Earlie Server did bx and this was NEG for malignancy.   . Mild cognitive impairment with memory loss    Likely from brain radiation therapy  . Non-obstructive CAD (coronary artery disease)    a. Cath 2006 preserved LV fxn, scattered irregularities without critical stenosis; b. 2008 stress echo negative for ischemia, but with hypertensive response; c. 08/2016 Cath: D1 25%, otw nl.  . Normocytic anemia 2018   Iron, vit B12, folate all normal 12/2016.  Marland Kitchen Open-angle glaucoma 2016   Dr. Shirley Muscat, (bilateral)---responding to topical therapy  . Osteoporosis 03/2016; 03/2018   03/2016 DEXA T-score -2.2.  03/2018 T-score L femoral neck  -2.7--boniva started.  . Pneumonia    hospitalization 11/2016; hypoxemic resp failure--d/c'd home with home oxygen therapy  . Subclinical hyperthyroidism 2018  . Takotsubo cardiomyopathy    a. 08/2016 Echo: EF 30-35%, gr1DD, PASP 29mHg;  b. 08/2016 Cath: nonobs Dzs;  c. 11/2016 Echo: EF 40-45%, no rwma, nl RV fxn, mild TR, PASP 337mg.  ECHO 02/2017 EF 50-55%, grd II DD---essentially resolved Takotsubo 02/2017.  . [place] de pointes [place]   a. 08/2016 in setting of diverticulitis & pneumoperitoneum and Takotsubo CM -->prolonged QT, seen by EP-->avoid meds with potential for QT prolongation.   Past Surgical History:  Procedure Laterality Date  . ABDOMINAL HYSTERECTOMY  1997  . APPENDECTOMY    . CARDIAC CATHETERIZATION N/A 09/14/2016   Procedure: Left Heart Cath and Coronary Angiography;  Surgeon: ThTroy SineMD;  [facility];  Service: Cardiovascular; Mild nonobstructive CAD with 85% smooth narrowing in the first diagonal branch of the LAD; 10% smooth narrowing of the ostial proximal left circumflex coronary artery; and a normal dominant RCA.  . Marland KitchenARPAL TUNNEL RELEASE    . CATARACT EXTRACTION    . CHOLECYSTECTOMY  2002  . COLONOSCOPY  10/24/00   normal.  [employer] hemoccult testing via [place]/25/16 was NEG  . dexa  03/2016; 03/2018   03/2018 T-score L femoral neck  -2.7 (worsened compared to  osteopenic range 2017.)  . IR GENERIC HISTORICAL  09/19/2016   IR SINUS/FIST TUBE CHK-NON GI 09/19/2016 JaCorrie MckusickDO [facility]  . IR GENERIC HISTORICAL  10/06/2016   IR RADIOLOGIST EVAL & MGMT 10/06/2016 [facility]  . IR GENERIC HISTORICAL  10/26/2016   IR RADIOLOGIST EVAL & MGMT 10/26/2016 [facility]  . IR GENERIC HISTORICAL  11/03/2016   IR RADIOLOGIST EVAL & MGMT 11/03/2016 WeArdis RowanPA-C [facility]  . IR GENERIC HISTORICAL  11/24/2016   IR RADIOLOGIST EVAL & MGMT 11/24/2016 WeArdis RowanPA-C [facility]  . IR GENERIC HISTORICAL  12/05/2016   Fistula smaller/improving.  IR SINUS/FIST TUBE CHK-NON GI 12/05/2016 JoSandi MariscalMD [facility]  . IR GENERIC HISTORICAL  12/22/2016   IR RADIOLOGIST EVAL & MGMT 12/22/2016 MiGreggory KeenMD [facility]  . IR RADIOLOGIST EVAL & MGMT  01/10/2017  .  IR RADIOLOGIST EVAL & MGMT  02/09/2017  . IR SINUS/FIST TUBE CHK-NON GI  03/28/2017  . LEFT HEART CATHETERIZATION WITH CORONARY ANGIOGRAM N/A 05/30/2012   Procedure: LEFT HEART CATHETERIZATION WITH CORONARY ANGIOGRAM;  Surgeon: Burnell Blanks, MD;  Location: [facility] C[facility] LAB;  Service: Cardiovascular: Mild, non-obstructive CAD  . TRANSTHORACIC ECHOCARDIOGRAM  09/14/2016; 11/2016   08/2016: EF 30-35 %, Akinesis of the mid-apicalanteroseptal myocardium.  Grade I DD.  Mild pulm HTN.  Repeat echo 11/2016, EF 40-45%, no rwma, nl RV fxn, mild TR, PASP 50mHg.    .Marland KitchenTRANSTHORACIC ECHOCARDIOGRAM  03/07/2017   EF 50-55%. Hypokinesis of the distal septum with overall low normal LV,  systolic function; mild diastolic dysfunction with elevated LV  filling pressure; mildly calcified aortic valve with mild AI; small pericardial effusion   Family History  Problem Relation Age of Onset  . Heart failure Mother   . Kidney disease Mother        renal failure  . Diabetes Mother   . Diabetes Father   . Diabetes Brother   . Heart disease Brother   . Heart attack Brother   . Heart  attack Brother   . Heart attack Brother   . Colon cancer Neg Hx    Social History   Socioeconomic History  . Marital status: Married    Spouse name: Not on file  . Number of children: Not on file  . Years of education: Not on file  . Highest education level: Not on file  Occupational History  . Occupation: Retired CPublic affairs consultant UNEMPLOYED  Social Needs  . Financial resource strain: Not on file  . Food insecurity:    Worry: Not on file    Inability: Not on file  . Transportation needs:    Medical: Not on file    Non-medical: Not on file  Tobacco Use  . Smoking status: Former Smoker    Packs/day: 1.00    Years: 45.00    Pack years: 45.00    Types: Cigarettes    Last attempt to quit: 10/24/2008    Years since quitting: 10.0  . Smokeless tobacco: Never Used  Substance and Sexual Activity  . Alcohol use: No    Alcohol/week: 0.0 standard drinks  . Drug use: No  . Sexual activity: Not on file  Lifestyle  . Physical activity:    Days per week: Not on file    Minutes per session: Not on file  . Stress: Not on file  Relationships  . Social connections:    Talks on phone: Not on file    Gets together: Not on file    Attends religious service: Not on file    Active member of club or organization: Not on file    Attends meetings of clubs or organizations: Not on file    Relationship status: Not on file  Other Topics Concern  . Not on file  Social History Narrative   [place] and daughter and son. Daughter's family is living with her and husband at present. She is ambulatory daily without cane or walker.     Outpatient Encounter Medications as of 11/12/2018  Medication Sig  . acetaminophen (TYLENOL) 500 MG tablet Take 500-1,000 mg by mouth every 6 (six) hours as needed for headache (or pain).   .Marland Kitchenalbuterol (PROAIR HFA) 108 (90 Base) MCG/ACT inhaler Inhale 2 puffs into the lungs every 6 (six) hours as needed for wheezing or shortness of  breath.  Marland Kitchen albuterol (PROVENTIL) (2.5 MG/3ML) 0.083% nebulizer solution USE ONE VIAL IN NEBULIZER EVERY 4 HOURS AS NEEDED FOR WHEEZING OR SHORTNESS OF BREATH  . aspirin 81 MG chewable tablet Chew 81 mg by mouth daily.  Marland Kitchen atorvastatin (LIPITOR) 80 MG tablet Take 0.5 tablets (40 mg total) by mouth daily.  . cetirizine (ZYRTEC) 10 MG tablet Take 10 mg by mouth daily.  . fluticasone (FLOVENT HFA) 220 MCG/ACT inhaler Inhale 2 puffs into the lungs 2 (two) times daily.  . furosemide (LASIX) 20 MG tablet Take 1 tablet (20 mg total) by mouth every other day.  . ibandronate (BONIVA) 150 MG tablet Take 1 tablet (150 mg total) by mouth every 30 (thirty) days. Take in the morning with a full glass of water, on an empty stomach, and do not take anything else by mouth or lie down for the next 30 min.  . Insulin Isophane & Regular Human (HUMULIN 70/30 KWIKPEN) (70-30) 100 UNIT/ML PEN Take a base dose of 9 U SQ at breakfast and 9 U SQ at evening meal + add additional units PER SLIDING SCALE EVERY MORNING AND EVENING 140-199 2UNITS, 200-250 4 UNITS, 251-299 6 UNITS, 300-349 8 UNITS, 350 OR ABOVE 10 UNITS  . metFORMIN (GLUCOPHAGE) 500 MG tablet TAKE 1 TABLET BY MOUTH TWICE DAILY WITH A MEAL  . metoprolol tartrate (LOPRESSOR) 25 MG tablet TAKE 1 TABLET BY MOUTH IN THE MORNING AND TAKE 2 TABLETS BY MOUTH AT BEDTIME  . ONE TOUCH ULTRA TEST test strip USE  STRIP TO CHECK GLUCOSE THREE TIMES DAILY  . ONETOUCH DELICA LANCETS 16W MISC USE 1  TO CHECK GLUCOSE THREE TIMES DAILY  . pantoprazole (PROTONIX) 40 MG tablet TAKE 1 TABLET BY MOUTH ONCE DAILY  . potassium chloride (K-DUR) 10 MEQ tablet TAKE 1 TABLET BY MOUTH ONCE DAILY  . Zoster Vaccine Adjuvanted [facility]) injection Inject 0.5 mLs into the muscle once for 1 dose.  . [DISCONTINUED] escitalopram (LEXAPRO) 20 MG tablet Take 1 tablet (20 mg total) by mouth daily.   No facility-administered encounter medications on file as of 11/12/2018.     Activities of Daily  Living In your present state of health, do you have any difficulty performing the following activities: 11/12/2018  Hearing? Y  Vision? N  Difficulty concentrating or making decisions? Y  Walking or climbing stairs? Y  Dressing or bathing? N  Doing errands, shopping? Y  Preparing Food and eating ? Y  Using the Toilet? N  In the past six months, have you accidently leaked urine? Y  Do you have problems with loss of bowel control? N  Managing your Medications? Y  Managing your Finances? Y  Housekeeping or managing your Housekeeping? Y  Some recent data might be hidden    Patient Care Team: [name], MD as PCP - General (Family Medicine) Leonie Man, MD as PCP - Cardiology (Cardiology) Rigoberto Noel, MD as Consulting Physician (Pulmonary Disease) Shirley Muscat Loreen Freud, MD as Consulting Physician (Optometry) Curt Bears, MD as Consulting Physician (Oncology) Hayden Pedro, MD as Consulting Physician (Ophthalmology) Almyra Deforest, [place] as Consulting Physician (Cardiology) Leonie Man, MD as Consulting Physician (Cardiology) Ralene Ok, MD as Consulting Physician (General Surgery)    Assessment:   This is a routine wellness examination for [name].  Exercise Activities and Dietary recommendations Current Exercise Habits: The patient does not participate in regular exercise at present, Exercise limited by: None identified Diet (meal preparation, eat out, water intake, caffeinated beverages, dairy  products, fruits and vegetables): Drinks tea (splenda), diet soda, coffee and some water.   Brunch: cereal; toast; cheese toast; coffee Dinner: Protein and vegetables.   Goals    . Increase physical activity     Increase walking    . Patient Stated     Maintain current health.        Fall Risk Fall Risk  11/12/2018 11/12/2018 11/10/2017 02/06/2017 12/20/2016  Falls in the past year? 1 1 Yes Yes No  Comment - - Fell at car d/t rain - -  Number falls in past yr:  1 1 1 1  -  Comment - - - took shower stool out and was sitting on it to get dressed and slipped off the shower chair. -  Injury with Fall? 0 1 Yes No -  Risk Factor Category  - - - High Fall Risk -  Risk for fall due to : Other (Comment) - - Impaired mobility;History of fall(s) -  Risk for fall due to: Comment tripping - - - -  Follow up Falls prevention discussed - Falls prevention discussed Falls prevention discussed Falls prevention discussed  Comment - - - - -    Depression Screen PHQ 2/9 Scores 11/12/2018 08/10/2018 05/10/2018 02/08/2018  PHQ - 2 Score 6 5 4 1   PHQ- 9 Score 22 16 15 9      Cognitive Function MMSE - Mini Mental State Exam 11/12/2018 11/10/2017 07/05/2016  Orientation to time 4 5 2   Orientation to Place 5 5 5   Registration 3 3 3   Attention/ Calculation 3 3 5   Recall 0 2 3  Language- name 2 objects 2 2 2   Language- repeat 1 1 1   Language- follow 3 step command 3 3 3   Language- read & follow direction 1 1 1   Write a sentence 1 1 1   Copy design 1 1 1   Total score 24 27 27         Immunization History  Administered Date(s) Administered  . Influenza Split 07/06/2012  . Influenza Whole 07/24/2006, 08/06/2007, 07/18/2008, 07/29/2009, 08/25/2010, 07/01/2011  . Influenza, High Dose Seasonal PF 07/03/2015, 07/05/2016, 08/10/2017, 08/10/2018  . Influenza,inj,Quad PF,6+ Mos 07/18/2013, 08/21/2014  . Pneumococcal Conjugate-13 02/05/2011  . Pneumococcal Polysaccharide-23 07/18/2008, 05/13/2014  . Tdap 06/10/2017  . Zoster 04/07/2015    Screening Tests Health Maintenance  Topic Date Due  . HEMOGLOBIN A1C  02/09/2019  . OPHTHALMOLOGY EXAM  03/13/2019  . FOOT EXAM  08/11/2019  . URINE MICROALBUMIN  08/11/2019  . COLONOSCOPY  10/01/2020  . INFLUENZA VACCINE  Completed  . DEXA SCAN  Completed  . Hepatitis C Screening  Completed  . PNA vac Low Risk Adult  Completed        Plan:    Shingles vaccine at pharmacy.   Continue doing brain stimulating activities  (puzzles, reading, adult coloring books, staying active) to keep memory sharp.   Bring a copy of your living will and/or healthcare power of attorney to your next office visit.  I have personally reviewed and noted the following in the patient's chart:   . Medical and social history . Use of alcohol, tobacco or illicit drugs  . Current medications and supplements . Functional ability and status . Nutritional status . Physical activity . Advanced directives . List of other physicians . Hospitalizations, surgeries, and ER visits in previous 12 months . Vitals . Screenings to include cognitive, depression, and falls . Referrals and appointments  In addition, I have reviewed and discussed with patient certain  preventive protocols, quality metrics, and best practice recommendations. A written personalized care plan for preventive services as well as general preventive health recommendations were provided to patient.     Gerilyn Nestle, RN  11/12/2018

## 2018-11-18 ENCOUNTER — Encounter: Payer: Self-pay | Admitting: Family Medicine

## 2018-11-18 NOTE — Progress Notes (Signed)
AWV reviewed and agree. Signed:  Crissie Sickles, MD           11/18/2018

## 2018-12-07 ENCOUNTER — Other Ambulatory Visit: Payer: Self-pay | Admitting: Family Medicine

## 2018-12-07 NOTE — Telephone Encounter (Signed)
Copied from [place] [phone]. Topic: Quick Communication - Rx Refill/Question >> Dec 07, 2018  4:30 PM Wynetta Emery, [place] C wrote: Medication: albuterol (PROVENTIL) (2.5 MG/3ML) 0.083% nebulizer solution - pt will be over the weekend.   Has the patient contacted their pharmacy? No   (Agent: If no, request that the patient contact the pharmacy for the refill.) (Agent: If yes, when and what did the pharmacy advise?)  Preferred Pharmacy (with phone number or street name): [facility], [place]. [phone] (Phone) [phone] (Fax)    Agent: Please be advised that RX refills may take up to 3 business days. We ask that you follow-up with your pharmacy.

## 2018-12-07 NOTE — Telephone Encounter (Signed)
Requested medication (s) are due for refill today: Yes  Requested medication (s) are on the active medication list: Yes  Last refill:  02/16/17  Future visit scheduled: Yes  Notes to clinic:  Expired, patient would like refill to have this weekend.     Requested Prescriptions  Pending Prescriptions Disp Refills   albuterol (PROVENTIL) (2.5 MG/3ML) 0.083% nebulizer solution 75 mL 3    Sig: USE ONE VIAL IN NEBULIZER EVERY 4 HOURS AS NEEDED FOR WHEEZING OR SHORTNESS OF BREATH     Pulmonology:  Beta Agonists Failed - 12/07/2018  4:34 PM      Failed - One inhaler should last at least one month. If the patient is requesting refills earlier, contact the patient to check for uncontrolled symptoms.      Passed - Valid encounter within last 12 months    Recent Outpatient Visits          3 weeks ago Moderate episode of recurrent major depressive disorder ([place])   [place], [name], MD   3 months ago Concussion without loss of consciousness, subsequent encounter   [place], [name], MD   3 months ago Type 2 diabetes with complication [facility])   [place], [name], MD   7 months ago Type 2 diabetes mellitus with complication, with long-term current use of insulin ([place])   [place], [name], MD   10 months ago Type 2 diabetes mellitus with complication, with long-term current use of insulin ([place])   [place], [name], MD      Future Appointments            In 6 days [place], [name], MD [place], [facility]

## 2018-12-09 ENCOUNTER — Other Ambulatory Visit: Payer: Self-pay | Admitting: Family Medicine

## 2018-12-10 MED ORDER — ALBUTEROL SULFATE (2.5 MG/3ML) 0.083% IN NEBU: INHALATION_SOLUTION | RESPIRATORY_TRACT | 3 refills | Status: DC

## 2018-12-10 NOTE — Telephone Encounter (Signed)
RX refill for albuterol  Last RF was 02/16/2017 X 3 RFS.  Okay to fill?

## 2018-12-13 ENCOUNTER — Ambulatory Visit (INDEPENDENT_AMBULATORY_CARE_PROVIDER_SITE_OTHER): Payer: Medicare Other | Admitting: Family Medicine

## 2018-12-13 ENCOUNTER — Other Ambulatory Visit: Payer: Self-pay

## 2018-12-13 ENCOUNTER — Encounter: Payer: Self-pay | Admitting: Family Medicine

## 2018-12-13 VITALS — BP 115/62 | HR 75 | Temp 98.0°F | Resp 16 | Ht 62.0 in | Wt 157.5 lb

## 2018-12-13 DIAGNOSIS — R2681 Unsteadiness on feet: Secondary | ICD-10-CM | POA: Diagnosis not present

## 2018-12-13 DIAGNOSIS — F330 Major depressive disorder, recurrent, mild: Secondary | ICD-10-CM | POA: Diagnosis not present

## 2018-12-13 DIAGNOSIS — R296 Repeated falls: Secondary | ICD-10-CM | POA: Diagnosis not present

## 2018-12-13 DIAGNOSIS — R5381 Other malaise: Secondary | ICD-10-CM

## 2018-12-13 MED ORDER — BUPROPION HCL ER (XL) 150 MG PO TB24: 150.0000 mg | ORAL_TABLET | Freq: Every day | ORAL | 6 refills | Status: DC

## 2018-12-13 MED ORDER — ATORVASTATIN CALCIUM 40 MG PO TABS: 40.0000 mg | ORAL_TABLET | Freq: Every day | ORAL | 3 refills | Status: DC

## 2018-12-13 NOTE — Progress Notes (Signed)
OFFICE VISIT  12/13/2018   CC:  Chief Complaint  Patient presents with  . Follow-up    Depression   HPI:    Patient is a 73 y.o. Caucasian female who presents for 4 weeks f/u depression. Last visit I cut her lexapro back to 80m qd and added wellbutrin xl 1516mqd. Mood improved, irritability is down, "she doesn't act as depressed all the time". No known adverse effects since med changes.  Daughter concerned b/c of pt's gradual decrease in balance, increased need for walker, occ fall at home. She asks if they can have a sitter to be home with Mr. And Mrs DaChastanghen she is out of the home for long periods b/c they need full time supervision.  Past Medical History:  Diagnosis Date  . Age-related nuclear cataract of both eyes 2016   +cortical age related cataracts OU   . Allergy    seasonal  . Arthritis    hands  . Bilateral diabetic retinopathy (HCC) 2015   Dr. MaZigmund Daniel. Blood transfusion without reported diagnosis    when taking chemo  . CKD (chronic kidney disease), stage II    GFR 60s  . 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  . Marland KitchenOPD (chronic obstructive pulmonary disease) ([facility]  . CVA (cerebral vascular accident) ([facility]/2017   MRI did show a left-sided small ischemic stroke which was acute but likely incidental (MRI was done b/c pt had TIA sx's in hospital).  Neuro put her on plavix at that time.  Cardiology d/c'd her plavix 08/2017.  . Diabetes mellitus with complication (HCC)    diab retinopathy OU (laser)  . GERD (gastroesophageal reflux disease)    protonix  . History of concussion 08/25/2018   w/out loss of consciousness--mild increase in baseline memory impairment after.  CT head neg acute.  . Marland Kitchenistory of diverticulitis of colon    with abscess; required IR percutaneous drain placed 09/2016.  . Marland Kitchenyperlipidemia   . Left bundle branch block (LBBB) 12/16/2016  . Lung cancer ([facility]   non-small cell lung ca, stage III  in 05/2011; systemic chemotherapy concurrent with radiation followed by prophylactic cranial irradiation and has been observation since July of 2010 with no evidence for disease recurrence-released from onc f/u 12/2014 (needs annual cxr by PCP).  CXR 08/2015 stable.  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 MoEarlie Serverid bx and this was NEG for malignancy.  . Mild cognitive impairment with memory loss    Likely from brain radiation therapy  . Non-obstructive CAD (coronary artery disease)    a. Cath 2006 preserved LV fxn, scattered irregularities without critical stenosis; b. 2008 stress echo negative for ischemia, but with hypertensive response; c. 08/2016 Cath: D1 25%, otw nl.  . Normocytic anemia 2018   Iron, vit B12, folate all normal 12/2016.  . Marland Kitchenpen-angle glaucoma 2016   Dr. BeShirley Muscat(bilateral)---responding to topical therapy  . Osteoporosis 03/2016; 03/2018   03/2016 DEXA T-score -2.2.  03/2018 T-score L femoral neck  -2.7--boniva started.  . Pneumonia    hospitalization 11/2016; hypoxemic resp failure--d/c'd home with home oxygen therapy  . Subclinical hyperthyroidism 2018  . Takotsubo cardiomyopathy    a. 08/2016 Echo: EF 30-35%, gr1DD, PASP 3855m;  b. 08/2016 Cath: nonobs Dzs;  c. 11/2016 Echo: EF 40-45%, no rwma, nl RV fxn, mild TR, PASP 76m32m  ECHO 02/2017 EF 50-55%, grd II DD---essentially resolved Takotsubo 02/2017.  . Torsades de pointes (HCC)[place]/2017   a.  08/2016 in setting of diverticulitis & pneumoperitoneum and Takotsubo CM -->prolonged QT, seen by EP-->avoid meds with potential for QT prolongation.    Past Surgical History:  Procedure Laterality Date  . ABDOMINAL HYSTERECTOMY  1997  . APPENDECTOMY    . CARDIAC CATHETERIZATION N/A 09/14/2016   Procedure: Left Heart Cath and Coronary Angiography;  Surgeon: Troy Sine, MD;  Location: [place] [facility];  Service: Cardiovascular; Mild nonobstructive CAD with 85% smooth narrowing in the first diagonal branch of the LAD; 10% smooth  narrowing of the ostial proximal left circumflex coronary artery; and a normal dominant RCA.  Marland Kitchen CARPAL TUNNEL RELEASE    . CATARACT EXTRACTION    . CHOLECYSTECTOMY  2002  . COLONOSCOPY  10/24/00   normal.  [employer] hemoccult testing via [place] 06/18/15 was NEG  . dexa  03/2016; 03/2018   03/2018 T-score L femoral neck  -2.7 (worsened compared to osteopenic range 2017.)  . IR GENERIC HISTORICAL  09/19/2016   IR SINUS/FIST TUBE CHK-NON GI 09/19/2016 Corrie Mckusick, DO [facility]  . IR GENERIC HISTORICAL  10/06/2016   IR RADIOLOGIST EVAL & MGMT 10/06/2016 [facility]  . IR GENERIC HISTORICAL  10/26/2016   IR RADIOLOGIST EVAL & MGMT 10/26/2016 [facility]  . IR GENERIC HISTORICAL  11/03/2016   IR RADIOLOGIST EVAL & MGMT 11/03/2016 Ardis Rowan, PA-C [facility]  . IR GENERIC HISTORICAL  11/24/2016   IR RADIOLOGIST EVAL & MGMT 11/24/2016 Ardis Rowan, PA-C [facility]  . IR GENERIC HISTORICAL  12/05/2016   Fistula smaller/improving.  IR SINUS/FIST TUBE CHK-NON GI 12/05/2016 Sandi Mariscal, MD [facility]  . IR GENERIC HISTORICAL  12/22/2016   IR RADIOLOGIST EVAL & MGMT 12/22/2016 Greggory Keen, MD [facility]  . IR RADIOLOGIST EVAL & MGMT  01/10/2017  . IR RADIOLOGIST EVAL & MGMT  02/09/2017  . IR SINUS/FIST TUBE CHK-NON GI  03/28/2017  . LEFT HEART CATHETERIZATION WITH CORONARY ANGIOGRAM N/A 05/30/2012   Procedure: LEFT HEART CATHETERIZATION WITH CORONARY ANGIOGRAM;  Surgeon: Burnell Blanks, MD;  Location: [facility];  Service: Cardiovascular: Mild, non-obstructive CAD  . TRANSTHORACIC ECHOCARDIOGRAM  09/14/2016; 11/2016   08/2016: EF 30-35 %, Akinesis of the mid-apicalanteroseptal myocardium.  Grade I DD.  Mild pulm HTN.  Repeat echo 11/2016, EF 40-45%, no rwma, nl RV fxn, mild TR, PASP 57mHg.    .Marland KitchenTRANSTHORACIC ECHOCARDIOGRAM  03/07/2017   EF 50-55%. Hypokinesis of the distal septum with overall low normal LV,  systolic function; mild diastolic dysfunction with  elevated LV  filling pressure; mildly calcified aortic valve with mild AI; small pericardial effusion    Outpatient Medications Prior to Visit  Medication Sig Dispense Refill  . acetaminophen (TYLENOL) 500 MG tablet Take 500-1,000 mg by mouth every 6 (six) hours as needed for headache (or pain).     .Marland Kitchenalbuterol (PROAIR HFA) 108 (90 Base) MCG/ACT inhaler Inhale 2 puffs into the lungs every 6 (six) hours as needed for wheezing or shortness of breath. 18 g 3  . albuterol (PROVENTIL) (2.5 MG/3ML) 0.083% nebulizer solution USE ONE VIAL IN NEBULIZER EVERY 4 HOURS AS NEEDED FOR WHEEZING OR SHORTNESS OF BREATH 75 mL 3  . aspirin 81 MG chewable tablet Chew 81 mg by mouth daily.    . cetirizine (ZYRTEC) 10 MG tablet Take 10 mg by mouth daily.    .Marland Kitchenescitalopram (LEXAPRO) 20 MG tablet 1/2 tab po qd 30 tablet 3  . fluticasone (FLOVENT HFA)  220 MCG/ACT inhaler Inhale 2 puffs into the lungs 2 (two) times daily. 12 g 6  . furosemide (LASIX) 20 MG tablet Take 1 tablet (20 mg total) by mouth every other day. 90 tablet 1  . ibandronate (BONIVA) 150 MG tablet Take 1 tablet (150 mg total) by mouth every 30 (thirty) days. Take in the morning with a full glass of water, on an empty stomach, and do not take anything else by mouth or lie down for the next 30 min. 3 tablet 3  . Insulin Isophane & Regular Human (HUMULIN 70/30 KWIKPEN) (70-30) 100 UNIT/ML PEN Take a base dose of 9 U SQ at breakfast and 9 U SQ at evening meal + add additional units PER SLIDING SCALE EVERY MORNING AND EVENING 140-199 2UNITS, 200-250 4 UNITS, 251-299 6 UNITS, 300-349 8 UNITS, 350 OR ABOVE 10 UNITS 15 pen 1  . metFORMIN (GLUCOPHAGE) 500 MG tablet TAKE 1 TABLET BY MOUTH TWICE DAILY WITH A MEAL 180 tablet 0  . metoprolol tartrate (LOPRESSOR) 25 MG tablet TAKE 1 TABLET BY MOUTH IN THE MORNING AND TAKE 2 TABLETS BY MOUTH AT BEDTIME 270 tablet 1  . ONE TOUCH ULTRA TEST test strip USE  STRIP TO CHECK GLUCOSE THREE TIMES DAILY 100 each 11  . ONETOUCH  DELICA LANCETS 33G MISC USE 1  TO CHECK GLUCOSE THREE TIMES DAILY 100 each 11  . pantoprazole (PROTONIX) 40 MG tablet TAKE 1 TABLET BY MOUTH ONCE DAILY 90 tablet 3  . potassium chloride (K-DUR) 10 MEQ tablet TAKE 1 TABLET BY MOUTH ONCE DAILY 90 tablet 1  . atorvastatin (LIPITOR) 80 MG tablet Take 0.5 tablets (40 mg total) by mouth daily. 90 tablet 1  . buPROPion (WELLBUTRIN XL) 150 MG 24 hr tablet Take 1 tablet (150 mg total) by mouth daily. 30 tablet 0   No facility-administered medications prior to visit.     Allergies  Allergen Reactions  . Lactose Intolerance (Gi) Diarrhea  . Ibuprofen Other (See Comments)    REACTION: Anxiousness, hyperventilates    ROS As per HPI  PE: Blood pressure 115/62, pulse 75, temperature 98 F (36.7 C), temperature source Oral, resp. rate 16, height 5' 2" (1.575 m), weight 157 lb 8 oz (71.4 kg), SpO2 98 %. Gen: Alert, well appearing.  Patient is oriented to person, place, time, and situation. AFFECT: pleasant, lucid thought and speech. No further exam today.  LABS:  Lab Results  Component Value Date   HGBA1C 7.3 (H) 11/12/2018     Chemistry      Component Value Date/Time   NA 140 11/12/2018 1001   NA 141 09/19/2018 0918   NA 141 01/15/2015 0848   K 4.9 11/12/2018 1001   K 4.0 01/15/2015 0848   CL 102 11/12/2018 1001   CL 105 02/18/2013 0812   CO2 28 11/12/2018 1001   CO2 25 01/15/2015 0848   BUN 13 11/12/2018 1001   BUN 16 09/19/2018 0918   BUN 9.0 01/15/2015 0848   CREATININE 1.05 11/12/2018 1001   CREATININE 1.01 (H) 08/10/2018 1611   CREATININE 0.7 01/15/2015 0848      Component Value Date/Time   CALCIUM 9.1 11/12/2018 1001   CALCIUM 9.2 01/15/2015 0848   ALKPHOS 92 09/19/2018 0918   ALKPHOS 92 01/15/2015 0848   AST 20 09/19/2018 0918   AST 19 01/15/2015 0848   ALT 17 09/19/2018 0918   ALT 20 01/15/2015 0848   BILITOT 0.3 09/19/2018 0918   BILITOT 0.34 01/15/2015 0848        IMPRESSION AND PLAN:  1) MDD, improving on  wellbutrin xl 150mg and lexapro 10mg qd. Will continue this combo. Recheck 2 mo.  2) Debilitation, gait instability, gradual decline in overall function + now with occ fall at home. Will see if we can get HH agency to help with needs assessment for the pt's/family and help them know their options.  Pt did not express much interest in doing out of the home PT.  An After Visit Summary was printed and given to the patient.  FOLLOW UP: Return in about 2 months (around 02/11/2019) for routine chronic illness f/u (30 min).  Signed:  Phil , MD           12/13/2018     

## 2018-12-13 NOTE — Progress Notes (Signed)
Per verbal order from PCP, [facility] referral placed.

## 2018-12-14 ENCOUNTER — Other Ambulatory Visit: Payer: Self-pay | Admitting: *Deleted

## 2018-12-14 ENCOUNTER — Encounter: Payer: Self-pay | Admitting: *Deleted

## 2018-12-14 NOTE — Patient Outreach (Signed)
[facility]) Care Management  12/14/2018  [name] April 27, 1946 [id number]   Telephone Screen  Referral Date: 12/13/2018 Referral Source: MD referral Rachael Matar, RN for Dr [name] Referral Reason: gait instability, recurrent falls, debilitated pt   Diagnoses of Heart Failure, COPD/ Pneumonia, Diabetes, Stroke: Ischemic/TIA  Insurance: united health care medicare   [name] has previous been followed by [facility] RN CM and NP in 2017-2018  Outreach attempt # 1 successful at the home number for pt Patient is able to verify HIPAA Reviewed and addressed referral to [facility] with patient  [name] informed CM she has just recently moved to her address and initially had concern with remembering it CM mentioned the street name and she was able to then provide her correct address   When Cm asked her about goals she wanted to work on for her home care she stated she could not think of anything  During the assessment of [name] CM noted [name] with intervals of coughing and sneezing. She informs CM she is not aware of possible causes, is taking inhaler/nebulizer "when I think about it" She denies use of oxygen and only states she has sob when lying down. CHF s/s and action plan reviewed. [name] states she  lost her wt chart during the "move in October" but has her scales. She states she has not been weighing daily because of not having her chart. CM offered to have her sent a new [facility] calendar with an area to start recording her wt. Cm encouraged her to weigh daily She reports she previously weighted 179 lbs and was 159 lbs at MD office yesterday Epic indicates the 12/13/18 wt was 157 lbs  Cm also noted during the assessment [name] complained of not being able to hear, speaking loudly at intervals and states she "forgets things"     She reports she remains weak on her left side after her stroke but denies any pain or worsening issues She reports a  loss of interest in things but states she is on medication, Wellbutrin XL    At the end of the call [name] gave [facility] RN CM permission to speak with her daughter, [name]. [name] confirms that [name] in the "last few weeks" has had a poor appetite, increase falls, memory concerns, "very weak, tires easily and not able to do anything for herself" and has had to rely on use of a cane or rolling walker  [name] reports that her MDs have mentioned [name] memory concerns may be related to her hx of cancer and radiation to brain. [name] confirms she has not been seen by a neurologist. [name] reports [name] will not eat or use her nebulizer if she or her husband "don't make her and she fusses the entire time."  [name] states she was ordered to use her nebulizer q 4 hours  Social: [name] lives at home with her husband [name] ), daughter [name] and her grand children. [name] assists both Mr & [name] in ADLs and IADLs. [name] reports "They won't let me do anything anymore around here." She reports not being allowed to get in her to tub to bathe related to falls She reports bathing sitting on a stool and independently feeding herself. She reports her health as poor. She is not able to do any iADLs like bills, shopping, cooking, her family completes iADLs and transports her to medical appointments    Conditions:  Heart Failure,  COPD/ Pneumonia, Diabetes, Stroke: Ischemic/TIA, Takotsubo cardiomyopathy, HTN, non occlusive CAD, hx of cardiac arrest, allergic rhinitis, GERD, diverticulitis of large intestine with perforation and abscess, former smoker, HDL, diarrhea,  DME chair to sit in at sink, rolling walker, cane, nebulizer, recent installation of a new tub bench and hand rail by her son, ramp, cbg meter  Falls; [name] informed CM she has fallen recently in the bath tub x 3, kitchen floor x 2 No fractures but has bruises  Medications: [name] denies concerns with  taking medications as prescribed, affording medications, side effects of medications and questions about medications because she reports her daughter, [name] picks up her medications from the pharmacy, places her medications bi weekly in containers and makes sure she takes them.  CM spoke with [name] about pill packaging She is aware of this but does both her parents medicines every 2 weeks and hers every month  Appointments: 02/11/2019 to f/u with Dr [name] - 2 month f/u  03/21/2019 to see her eye MD Dr Rodman Key  Advance Directives: Rachael [name] states she has a living will and reports she gave copy to [place]    Consent: [facility] RN CM reviewed [facility] services with patient. Patient gave verbal consent for services telephonic RN CM and agreed to home visits and calls from [place] discussed [facility] NP, Pharmacy, SW and Health coach services   Plan: [facility] RN CM will refer [name] to [place] RN CM for complex services related to noted worsening gait instability, recurrent falls, debilitated pt per MD office and confirmed by her daughter, [name] Pt with poor appetite, memory issues and loss of weight 2 ED visits in the last 6 months  Diagnoses of Heart Failure, COPD/ Pneumonia, Diabetes, Stroke: Ischemic/TIA    [name] L. Lavina Hamman, RN, BSN, [place] Coordinator Office number [phone] Mobile number [phone]  Main [facility] number [phone] Fax number [phone]

## 2018-12-17 NOTE — Patient Outreach (Addendum)
[name]) Care Management  12/14/2018  [name] 1946-03-25 [id number]    RN spoke with [name] [name] the primary caregiver and introduced the [facility] program and services. Note spouse is also a [name] with [facility]. [name] Pensions consultant) indicates [name] history and requested possible assistance in the home and with meal services due to the [name]'s limited mobility. RN offered to make a referral to a social worker to provider available resources. RN inquired on [name]'s medical issues as caregiver [name] indicates [name] could benefit from some education on COPD. RN offered to proceed with enrolling [name] into the program and offered a home visit for a more face-to-face education on [name]'s COPD. Will started with COPD and discuss the COPD Action plan. Will verify [name] is in there GREEN today however explained early intervention due to the severe of COPD and the damages that it can cause if unmonitored ([name] verbalized an understanding). Will discussed all medications, medical appointments pending and again stress the importance of COPD action plan if symptoms become acute. Plan of care generated with goals and interventions. Will reiterated on this plan of the upcoming home visit next week.  Will take supplies to the home for [facility] calendar,  MOST form and continue to inquired on any other assistance that may be needed.  [facility] CM Care Plan Problem One     Most Recent Value  Care Plan Problem One  Knowledge deficit related to COPD management  Role Documenting the Problem One  Care Management Coordinator  Care Plan for Problem One  Active  [facility] Long Term Goal   [name]/caregiver will be able to recite two symptoms of COPD exacerbation and what to do if acute within 90 days.  [facility] Long Term Goal Start Date  12/14/18  Interventions for Problem One Long Term Goal  Will education on the COPD action plan and discuss possible symptoms that can be encountered. Will discuss what to do if acute symptoms should occur. Will also  verify [name] is in the GREEN zone today with no acute symptoms at this time.   [facility] CM Short Term Goal #1   Medication adherence related to nebulizer and inhalers ususage over the next 30 days.  [facility] CM Short Term Goal #1 Start Date  12/14/18  Interventions for Short Term Goal #1  Will stress the importance of using her nebulizers as prescribed along with the scheduled dosage of the emergency inhalers. Will also verify [name] has her supplies and using efficiently.  [facility] CM Short Term Goal #2   [name] will be receptive to referral for social worker for community resoruces over the next 2 weeks.  [facility] CM Short Term Goal #2 Start Date  12/14/18  Interventions for Short Term Goal #2  Discussed possible resources to help the caregiver assist [name] with ADLs and other services that may be needed in the home.            Raina Mina, RN Care Management Coordinator [place] Office [phone]

## 2018-12-20 ENCOUNTER — Other Ambulatory Visit: Payer: Self-pay | Admitting: Family Medicine

## 2018-12-20 ENCOUNTER — Other Ambulatory Visit: Payer: Self-pay | Admitting: *Deleted

## 2018-12-20 ENCOUNTER — Telehealth: Payer: Self-pay | Admitting: Family Medicine

## 2018-12-20 NOTE — Patient Outreach (Addendum)
[facility]) Care Management   12/20/2018  [name] 1946-01-11 [id number]  [name] is an 73 y.o. female  Subjective:  COPD: Pt reports she is doing well with no symptoms reported since the last conversation with the pt's caregiver daughter [name]). Pt receptive to the education today as pt able to indicate she is in the GREEN zone with her COPD. Pt unaware of the severity of COPD until today's education. Pt more receptive to recognizing possible symptoms and will make her daughter aware when severe to report to her provider however aware when acute to seek medical attention if needed.  MEDICATIONS: Pt admits she has not been using her inhalers but she has been using her nebulizer around the clock.Pt states she "can't find her inhalers". Daughter indicates she will call the pharmacy (no refills indicated). Will call back and request refills via pt's pharmacy.  MEDICAL APPOINTMENTS: Will verify pt has sufficient transportation transportation services for all her medical appointments. Note pending social worker consult for additional source of transportation if needed.  NUTRITION: Caregiver also interested on possible mobile meals to assist with pt not at risk for standing in the kitchen long to prepare meals for herself and her spouse.  Objective:   Review of Systems  Constitutional: Negative.   HENT: Negative.   Eyes: Negative.   Respiratory: Negative.   Cardiovascular: Negative.   Gastrointestinal: Negative.   Genitourinary: Negative.   Musculoskeletal: Negative.   Skin: Negative.   Neurological: Negative.   Endo/Heme/Allergies: Negative.   Psychiatric/Behavioral: Negative.     Physical Exam  Constitutional: She is oriented to person, place, and time. She appears well-developed and well-nourished.  HENT:  Right Ear: External ear normal.  Left Ear: External ear normal.  Eyes: EOM are normal.  Neck: Normal range of motion.  Cardiovascular: Normal  heart sounds.  Respiratory: Effort normal and breath sounds normal.  GI: Soft. Bowel sounds are normal.  Musculoskeletal: Normal range of motion.  Neurological: She is alert and oriented to person, place, and time.  Skin: Skin is warm and dry.  Psychiatric: She has a normal mood and affect. Her behavior is normal. Judgment and thought content normal.    Encounter Medications:   Outpatient Encounter Medications as of 12/20/2018  Medication Sig  . acetaminophen (TYLENOL) 500 MG tablet Take 500-1,000 mg by mouth every 6 (six) hours as needed for headache (or pain).   Marland Kitchen albuterol (PROAIR HFA) 108 (90 Base) MCG/ACT inhaler Inhale 2 puffs into the lungs every 6 (six) hours as needed for wheezing or shortness of breath.  Marland Kitchen albuterol (PROVENTIL) (2.5 MG/3ML) 0.083% nebulizer solution USE ONE VIAL IN NEBULIZER EVERY 4 HOURS AS NEEDED FOR WHEEZING OR SHORTNESS OF BREATH  . aspirin 81 MG chewable tablet Chew 81 mg by mouth daily.  Marland Kitchen atorvastatin (LIPITOR) 40 MG tablet Take 1 tablet (40 mg total) by mouth daily.  Marland Kitchen buPROPion (WELLBUTRIN XL) 150 MG 24 hr tablet Take 1 tablet (150 mg total) by mouth daily.  . cetirizine (ZYRTEC) 10 MG tablet Take 10 mg by mouth daily.  Marland Kitchen escitalopram (LEXAPRO) 20 MG tablet 1/2 tab po qd  . fluticasone (FLOVENT HFA) 220 MCG/ACT inhaler Inhale 2 puffs into the lungs 2 (two) times daily.  . furosemide (LASIX) 20 MG tablet Take 1 tablet (20 mg total) by mouth every other day.  . ibandronate (BONIVA) 150 MG tablet Take 1 tablet (150 mg total) by mouth every 30 (thirty) days. Take in the morning with  a full glass of water, on an empty stomach, and do not take anything else by mouth or lie down for the next 30 min.  . Insulin Isophane & Regular Human (HUMULIN 70/30 KWIKPEN) (70-30) 100 UNIT/ML PEN Take a base dose of 9 U SQ at breakfast and 9 U SQ at evening meal + add additional units PER SLIDING SCALE EVERY MORNING AND EVENING 140-199 2UNITS, 200-250 4 UNITS, 251-299 6 UNITS,  300-349 8 UNITS, 350 OR ABOVE 10 UNITS  . metFORMIN (GLUCOPHAGE) 500 MG tablet TAKE 1 TABLET BY MOUTH TWICE DAILY WITH A MEAL  . metoprolol tartrate (LOPRESSOR) 25 MG tablet TAKE 1 TABLET BY MOUTH IN THE MORNING AND TAKE 2 TABLETS BY MOUTH AT BEDTIME  . ONE TOUCH ULTRA TEST test strip USE  STRIP TO CHECK GLUCOSE THREE TIMES DAILY  . ONETOUCH DELICA LANCETS 50V MISC USE 1  TO CHECK GLUCOSE THREE TIMES DAILY  . pantoprazole (PROTONIX) 40 MG tablet TAKE 1 TABLET BY MOUTH ONCE DAILY  . potassium chloride (K-DUR) 10 MEQ tablet TAKE 1 TABLET BY MOUTH ONCE DAILY   No facility-administered encounter medications on file as of 12/20/2018.     Functional Status:   In your present state of health, do you have any difficulty performing the following activities: 12/14/2018 12/14/2018  Hearing? [name]  Vision? N -  Difficulty concentrating or making decisions? [name]  Walking or climbing stairs? Y Y  Dressing or bathing? Y Y  Doing errands, shopping? [name]  Preparing Food and eating ? N -  Using the Toilet? Y -  In the past six months, have you accidently leaked urine? Y -  Do you have problems with loss of bowel control? Y -  Managing your Medications? Y Y  Managing your Finances? [name]  Housekeeping or managing your Housekeeping? Y Y  Some recent data might be hidden    Fall/Depression Screening:    Fall Risk  12/14/2018 11/12/2018 11/12/2018  Falls in the past year? 1 1 1   Comment - - -  Number falls in past yr: 1 1 1   Comment - - -  Injury with Fall? 1 0 1  Risk Factor Category  - - -  Risk for fall due to : History of fall(s);Impaired balance/gait;Impaired mobility;Mental status change Other (Comment) -  Risk for fall due to: Comment - tripping -  Follow up Education provided Falls prevention discussed -  Comment - - -   PHQ 2/9 Scores 12/14/2018 12/13/2018 11/12/2018 08/10/2018 05/10/2018 02/08/2018 11/10/2017  PHQ - 2 Score 1 3 6 5 4 1  0  PHQ- 9 Score - 19 22 16 15 9 6    BP (!) 98/58 (BP Location:  Left Arm, Patient Position: Sitting, Cuff Size: Normal)   Pulse 67   Resp 20   Ht 1.575 m (5\' 2" )   Wt 157 lb (71.2 kg)   SpO2 98%   BMI 28.72 kg/m  Assessment:   Case management related to COPD Medication related to inhalers Transportation related to resource (Social work consult) Nutritional needs related to mobile meals (Social work consult)  Plan:  Will educate COPD action zones and verify pt's is able to recognize related symptoms and report to her daughter when needed. Will also verify pt is in the GREEN zone today with no acute symptoms. Will provide and discuss the printed material on COPD and verify pt has an understanding of the risk if unmonitored. Will request pt to recognize such symptoms and recite  at least two of the symptoms over the few month. Will continue education accordingly with the provided information over the next few month. Will also engage with the primary caregiver (Delores to reiterate on the teaching today with pt. Will review medications and inquired on pt's inhalers. Will encourage daughter to contact pt's pharmacy and request the needed inhalers and encouraged pt to use accordingly however pt does not have the Flovent or ProAir. Daughter states the Flovent was ordered by another provider no longer involved with this pt back on Oct and pt was suppose to have her primary provider continue with the needed refills. Verified this was not done. RN contacted Dr. Idelle Leech office and spoke with Audrea Muscat (nurse) concerning the medication issues along the hypotension readings today (BP 98/58 left 92/54 right) with no symptoms encountered. Nurse aware pt has BP medication due for evening dosage as daughter [name] will awaiting further instructions on administering this medication to the pt with a call from the provider's office. Will stress the importance of accepting the call from the [facility] social worker to assist with other needed community resources such as transportation to  get to all her medical appointments. Also resources for mobile meals for ongoing nutritional needs.  Will discuss the plan of care, goals along with all the interventions that are adjusted as needed. Will re-evaluate next month and encourage ongoing adherence with improving her COPD management , medication adherence and use of available community resources (pending social work consultation via [facility]) to assist with ongoing management of care.  [facility]N CM Care Plan Problem One     Most Recent Value  Care Plan Problem One  Knowledge deficit related to COPD management  Role Documenting the Problem One  Care Management Coordinator  Care Plan for Problem One  Active  THN Long Term Goal   Pt/caregiver will be able to recite two symptoms of COPD exacerbation and what to do if acute within 90 days.  THN Long Term Goal Start Date  12/14/18  Interventions for Problem One Long Term Goal  Reiterated on the signs and symptoms to prevent COPD exacerbation. Will stress the importance of preventing  such symptoms and what to do if acute. Pt is aware to alert her caregiver when she encounters signs of distress.   THN CM Short Term Goal #1   Medication adherence related to nebulizer and inhalers ususage over the next 30 days.  THN CM Short Term Goal #1 Start Date  12/14/18  Interventions for Short Term Goal #1  Will stress the importance of using her inhalers correctly to prevent acute symptoms from occurring.  Will  strongly encouraged th use daily as scheduled and prn when needed and explain the importance of each medication,  THN CM Short Term Goal #2   Pt will be receptive to referral for social worker for community resoruces over the next 2 weeks.  THN CM Short Term Goal #2 Start Date  12/14/18  Interventions for Short Term Goal #2  Will continue to encouraged adherence with the services that maybe provided via social worker consultation. Will encouraged pt to remain receptive.      Raina Mina, RN Care  Management Coordinator [place] Office [phone]

## 2018-12-20 NOTE — Telephone Encounter (Signed)
Please note [facility]N RN's summary regarding inhalers.

## 2018-12-20 NOTE — Telephone Encounter (Signed)
RN with [facility] calling to report pt's BP left arm 98/58   Right arm 92/54. Reports pt has 2 doses of  Metoprolol left to take tonight . She is questioning if pt should hold meds tonight or order parameters. Pt is asymptomatic at this time. Please advise: [phone]

## 2018-12-21 ENCOUNTER — Other Ambulatory Visit: Payer: Self-pay | Admitting: Family Medicine

## 2018-12-21 MED ORDER — FLUTICASONE PROPIONATE HFA 220 MCG/ACT IN AERO: 2.0000 | INHALATION_SPRAY | Freq: Two times a day (BID) | RESPIRATORY_TRACT | 2 refills | Status: DC

## 2018-12-21 NOTE — Telephone Encounter (Signed)
Copied from [place] [id number]. Topic: Quick Communication - Rx Refill/Question >> Dec 21, 2018  3:40 PM Bea Graff, NT wrote: Medication: fluticasone (FLOVENT HFA) 220 MCG/ACT inhaler   Has the patient contacted their pharmacy? Yes.   (Agent: If no, request that the patient contact the pharmacy for the refill.) (Agent: If yes, when and what did the pharmacy advise?)  Preferred Pharmacy (with phone number or street name): [place], [place]. [phone] (Phone) [phone] (Fax)    Agent: Please be advised that RX refills may take up to 3 business days. We ask that you follow-up with your pharmacy.

## 2018-12-21 NOTE — Telephone Encounter (Signed)
Pts daughter calling to see if the BP medication should be held and requesting to be called back on her cell.

## 2018-12-21 NOTE — Telephone Encounter (Addendum)
The BP reported was taken yesterday evening- not today. Would be difficult to advise her what to do based on one reading yesterday.  If the pt was asymptomatic, then continue her normal regimen and follow up with PCP if there is an issue. If she becomes symptomatic over the weekend, she should be seen urgently.

## 2018-12-21 NOTE — Telephone Encounter (Signed)
Patient has appointment April- refills given Requested Prescriptions  Pending Prescriptions Disp Refills  . fluticasone (FLOVENT HFA) 220 MCG/ACT inhaler 12 g 2    Sig: Inhale 2 puffs into the lungs 2 (two) times daily.     Pulmonology:  Corticosteroids Passed - 12/21/2018  3:44 PM      Passed - Valid encounter within last 12 months    Recent Outpatient Visits          1 week ago MDD (major depressive disorder), recurrent episode, mild ([place])   [place], [name], MD   1 month ago Moderate episode of recurrent major depressive disorder [facility])   [place], [name], MD   3 months ago Concussion without loss of consciousness, subsequent encounter   [place], [name], MD   4 months ago Type 2 diabetes with complication [facility])   [place], [name], MD   7 months ago Type 2 diabetes mellitus with complication, with long-term current use of insulin [facility])   [place], [name], MD      Future Appointments            In 1 month [place], [name], MD [place], [facility]

## 2018-12-21 NOTE — Telephone Encounter (Signed)
Dr. Anitra Lauth is out of the office. Dr. Raoul Pitch please advise. Thanks.

## 2018-12-21 NOTE — Telephone Encounter (Signed)
RF request for Albuterol LOV: 11/12/18 Next ov: 02/11/19 Last written: 02/16/17 (18g, 3 RF)  Please advise, thanks.

## 2018-12-21 NOTE — Telephone Encounter (Signed)
Pts daughter advised and voiced understanding.

## 2018-12-21 NOTE — Telephone Encounter (Signed)
Patient's husband, [name] advised that inhaler has been sent in for patient.

## 2018-12-27 ENCOUNTER — Ambulatory Visit: Payer: Self-pay | Admitting: *Deleted

## 2018-12-28 ENCOUNTER — Other Ambulatory Visit: Payer: Self-pay

## 2018-12-28 NOTE — Patient Outreach (Signed)
[facility] [facility]) Care Management  12/28/2018  [name] [birth year] [id number]   Successful outreach to patient regarding social work referral.  [name] addressed multiple things with patient including transportation, mobile meals, financial resources/Medicaid.   Patient denied need for transportation as daughter takes her to medical appointments.  [name] did inform her about [employer] and encouraged her to contact [place] to determine if she has transportation benefits; this could be a resource if daughter is not available.  Patient verbalized understanding. [name] discussed mobile meals with patient and offered to put her and spouse on wait list. Patient and spouse both declined and patient denied having difficulty preparing meals.  [name] talked with patient about Medicaid/financial resources.  [name] informed patient that Medicaid covers a lot of things that Medicare does not including personal care services.  [name] asked patient about their ability to afford bills, medications, etc.  Patient said "we get along alright" and declined financial resources.  [name] is closing case due to patient denying offered resources.   Ronn Melena, [name] Social Worker [phone]

## 2019-01-17 ENCOUNTER — Other Ambulatory Visit: Payer: Self-pay | Admitting: *Deleted

## 2019-01-17 ENCOUNTER — Other Ambulatory Visit: Payer: Self-pay

## 2019-01-17 NOTE — Patient Outreach (Signed)
[facility]) Care Management  01/17/2019  [name] 05/28/1946 [id number]  Telephone Assessment (COPD)  RN spoke with pt who indicates she is feeling better now that she has all her medications and using her inhaler. Denies any issues and RN able to verify pt is in the GREEN zone with her COPD with no breathing issues and able to sleep and eat with no reported issues.  Verified pt has denied all social worker requested for nutrition (mobile meals), transportation needs and declined MCD application after discussion with the referring social worker via [facility] as initial requested from the involved RN case Freight forwarder.   RN discussed the plan of care and continue to encourage ongoing adherence with managing her COPD. Will adjust interventions accordingly and continue to encouraged adherence with the current plan of care. Pt receptive to this ongoing plan.  THN CM Care Plan Problem One     Most Recent Value  Care Plan Problem One  Knowledge deficit related to COPD management  Role Documenting the Problem One  Care Management Coordinator  Care Plan for Problem One  Active  THN Long Term Goal   Pt/caregiver will be able to recite two symptoms of COPD exacerbation and what to do if acute within 90 days.  THN Long Term Goal Start Date  12/14/18  Interventions for Problem One Long Term Goal  Will continue to receive feedback on pt's knowledge base. Will review and continue to extend this goal to allow adherence.   THN CM Short Term Goal #1   Medication adherence related to nebulizer and inhalers ususage over the next 30 days.  THN CM Short Term Goal #1 Start Date  12/14/18  [facility] CM Short Term Goal #1 Met Date  01/17/19  Interventions for Short Term Goal #1  Verified pt has all medications and breathing better.  THN CM Short Term Goal #2   Pt will be receptive to referral for social worker for community resoruces over the next 2 weeks.  THN CM Short Term Goal #2 Start Date  12/14/18  [facility]  CM Short Term Goal #2 Met Date  01/17/19      Raina Mina, RN Care Management Coordinator [place] Office [phone]

## 2019-02-03 ENCOUNTER — Other Ambulatory Visit: Payer: Self-pay | Admitting: Family Medicine

## 2019-02-11 ENCOUNTER — Ambulatory Visit (INDEPENDENT_AMBULATORY_CARE_PROVIDER_SITE_OTHER): Payer: Medicare Other | Admitting: Family Medicine

## 2019-02-11 ENCOUNTER — Encounter: Payer: Self-pay | Admitting: Family Medicine

## 2019-02-11 ENCOUNTER — Other Ambulatory Visit: Payer: Self-pay

## 2019-02-11 VITALS — BP 123/65 | HR 88 | Temp 99.2°F | Wt 155.2 lb

## 2019-02-11 DIAGNOSIS — E118 Type 2 diabetes mellitus with unspecified complications: Secondary | ICD-10-CM

## 2019-02-11 DIAGNOSIS — N183 Chronic kidney disease, stage 3 unspecified: Secondary | ICD-10-CM

## 2019-02-11 DIAGNOSIS — F324 Major depressive disorder, single episode, in partial remission: Secondary | ICD-10-CM

## 2019-02-11 DIAGNOSIS — Z794 Long term (current) use of insulin: Secondary | ICD-10-CM

## 2019-02-11 MED ORDER — INSULIN ISOPHANE & REGULAR (HUMAN 70-30)100 UNIT/ML KWIKPEN: PEN_INJECTOR | SUBCUTANEOUS | 1 refills | Status: DC

## 2019-02-11 MED ORDER — ESCITALOPRAM OXALATE 20 MG PO TABS: ORAL_TABLET | ORAL | 3 refills | Status: DC

## 2019-02-11 NOTE — Progress Notes (Signed)
Virtual Visit via Video Note  I connected with pt.  on 02/11/19 at  3:00 PM EDT by a video enabled telemedicine application and verified that I am speaking with the correct person using two identifiers.  Location patient: home Location provider:work or home office Persons participating in the virtual visit: patient, her daughter, myself.  I discussed the limitations of evaluation and management by telemedicine and the availability of in person appointments. The patient expressed understanding and agreed to proceed.   HPI: 73 y/o WF here for 3 mo f/u DM 2, recent MDD, and CRI III (GFR in the 32s). Labs 3 mo ago showed stable HbA1c, stable renal fxn, and normal electrolytes. No changes in her med regimen were made.  Interim Hx: Feeling well. Taking 70/30 9 U qAM and 7 U qSupper (this is base dosing, then add sliding scale which is typically not more than 4 units). Fasting glucoses 118-150 typically. Evening glucoses ((pre supper) 95-140 typically. Nothing lower than 76-->only happened once.  Drinking 2 cups coffee per day, 1-2 sodas per day. No water despite daughter trying to get her to drink this.  Daughter still says she thinks pt is depressed, although is improved since getting on wellbutrin (added to her lexapro). Still with irritability and easy to lash out at people/family if they "bother" her much.  No physical aggression is being displayed, no self harming behavior.  She denies SI or HI. Appetite is good.    Of note, she lives with her husband who has malignancy and is chronically immunosuppressed and has chronic hypoxic RF from COPD.   ROS: no CP, no SOB, no wheezing, no cough, no dizziness, no HAs, no rashes, no melena/hematochezia.  No polyuria or polydipsia.  No myalgias or arthralgias.   Past Medical History:  Diagnosis Date  . Age-related nuclear cataract of both eyes 2016   +cortical age related cataracts OU   . Allergy    seasonal  . Arthritis    hands  .  Bilateral diabetic retinopathy (HCC) 2015   Dr. Zigmund Daniel  . Blood transfusion without reported diagnosis    when taking chemo  . CKD (chronic kidney disease), stage II    GFR 60s  . 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  Marland Kitchen COPD (chronic obstructive pulmonary disease) ([place])   . CVA (cerebral vascular accident) ([place]) 09/2016   MRI did show a left-sided small ischemic stroke which was acute but likely incidental (MRI was done b/c pt had TIA sx's in hospital).  Neuro put her on plavix at that time.  Cardiology d/c'd her plavix 08/2017.  . Diabetes mellitus with complication (HCC)    diab retinopathy OU (laser)  . GERD (gastroesophageal reflux disease)    protonix  . History of concussion 08/25/2018   w/out loss of consciousness--mild increase in baseline memory impairment after.  CT head neg acute.  Marland Kitchen History of diverticulitis of colon    with abscess; required IR percutaneous drain placed 09/2016.  Marland Kitchen Hyperlipidemia   . Left bundle branch block (LBBB) 12/16/2016  . Lung cancer ([place])    non-small cell lung ca, stage III in 05/2011; systemic chemotherapy concurrent with radiation followed by prophylactic cranial irradiation and has been observation since July of 2010 with no evidence for disease recurrence-released from onc f/u 12/2014 (needs annual cxr by PCP).  CXR 08/2015 stable.  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 Earlie Server did bx and this was NEG for malignancy.  . Mild cognitive impairment  with memory loss    Likely from brain radiation therapy  . Non-obstructive CAD (coronary artery disease)    a. Cath 2006 preserved LV fxn, scattered irregularities without critical stenosis; b. 2008 stress echo negative for ischemia, but with hypertensive response; c. 08/2016 Cath: D1 25%, otw nl.  . Normocytic anemia 2018   Iron, vit B12, folate all normal 12/2016.  Marland Kitchen Open-angle glaucoma 2016   Dr. Shirley Muscat, (bilateral)---responding to topical therapy  .  Osteoporosis 03/2016; 03/2018   03/2016 DEXA T-score -2.2.  03/2018 T-score L femoral neck  -2.7--boniva started.  . Pneumonia    hospitalization 11/2016; hypoxemic resp failure--d/c'd home with home oxygen therapy  . Subclinical hyperthyroidism 2018  . Takotsubo cardiomyopathy    a. 08/2016 Echo: EF 30-35%, gr1DD, PASP 79mHg;  b. 08/2016 Cath: nonobs Dzs;  c. 11/2016 Echo: EF 40-45%, no rwma, nl RV fxn, mild TR, PASP 334mg.  ECHO 02/2017 EF 50-55%, grd II DD---essentially resolved Takotsubo 02/2017.  . [place]   a. 08/2016 in setting of diverticulitis & pneumoperitoneum and Takotsubo CM -->prolonged QT, seen by EP-->avoid meds with potential for QT prolongation.    Past Surgical History:  Procedure Laterality Date  . ABDOMINAL HYSTERECTOMY  1997  . APPENDECTOMY    . CARDIAC CATHETERIZATION N/A 09/14/2016   Procedure: Left Heart Cath and Coronary Angiography;  Surgeon: ThTroy SineMD;  [facility];  Service: Cardiovascular; Mild nonobstructive CAD with 85% smooth narrowing in the first diagonal branch of the LAD; 10% smooth narrowing of the ostial proximal left circumflex coronary artery; and a normal dominant RCA.  . Marland KitchenARPAL TUNNEL RELEASE    . CATARACT EXTRACTION    . CHOLECYSTECTOMY  2002  . COLONOSCOPY  10/24/00   normal.  [employer] hemoccult testing [facility]/25/16 was NEG  . dexa  03/2016; 03/2018   03/2018 T-score L femoral neck  -2.7 (worsened compared to osteopenic range 2017.)  . IR GENERIC HISTORICAL  09/19/2016   IR SINUS/FIST TUBE CHK-NON GI 09/19/2016 JaCorrie MckusickDO [facility]  . IR GENERIC HISTORICAL  10/06/2016   IR RADIOLOGIST EVAL & MGMT 10/06/2016 [facility]  . IR GENERIC HISTORICAL  10/26/2016   IR RADIOLOGIST EVAL & MGMT 10/26/2016 [facility]  . IR GENERIC HISTORICAL  11/03/2016   IR RADIOLOGIST EVAL & MGMT 11/03/2016 WeArdis RowanPA-C [facility]  . IR GENERIC HISTORICAL  11/24/2016   IR RADIOLOGIST  EVAL & MGMT 11/24/2016 WeArdis RowanPA-C [facility]  . IR GENERIC HISTORICAL  12/05/2016   Fistula smaller/improving.  IR SINUS/FIST TUBE CHK-NON GI 12/05/2016 JoSandi MariscalMD [facility]  . IR GENERIC HISTORICAL  12/22/2016   IR RADIOLOGIST EVAL & MGMT 12/22/2016 MiGreggory KeenMD [facility]  . IR RADIOLOGIST EVAL & MGMT  01/10/2017  . IR RADIOLOGIST EVAL & MGMT  02/09/2017  . IR SINUS/FIST TUBE CHK-NON GI  03/28/2017  . LEFT HEART CATHETERIZATION WITH CORONARY ANGIOGRAM N/A 05/30/2012   Procedure: LEFT HEART CATHETERIZATION WITH CORONARY ANGIOGRAM;  Surgeon: ChBurnell BlanksMD;  [facility];  Service: Cardiovascular: Mild, non-obstructive CAD  . TRANSTHORACIC ECHOCARDIOGRAM  09/14/2016; 11/2016   08/2016: EF 30-35 %, Akinesis of the mid-apicalanteroseptal myocardium.  Grade I DD.  Mild pulm HTN.  Repeat echo 11/2016, EF 40-45%, no rwma, nl RV fxn, mild TR, PASP 3767m.    . TMarland KitchenANSTHORACIC ECHOCARDIOGRAM  03/07/2017   EF 50-55%. Hypokinesis of the  distal septum with overall low normal LV,  systolic function; mild diastolic dysfunction with elevated LV  filling pressure; mildly calcified aortic valve with mild AI; small pericardial effusion    Family History  Problem Relation Age of Onset  . Heart failure Mother   . Kidney disease Mother        renal failure  . Diabetes Mother   . Diabetes Father   . Diabetes Brother   . Heart disease Brother   . Heart attack Brother   . Heart attack Brother   . Heart attack Brother   . Colon cancer Neg Hx       Current Outpatient Medications:  .  acetaminophen (TYLENOL) 500 MG tablet, Take 500-1,000 mg by mouth every 6 (six) hours as needed for headache (or pain). , Disp: , Rfl:  .  albuterol (PROVENTIL HFA;VENTOLIN HFA) 108 (90 Base) MCG/ACT inhaler, INHALE 2 PUFFS BY MOUTH EVERY 6 HOURS AS NEEDED FOR WHEEZING OR SHORTNESS OF BREATH, Disp: 18 g, Rfl: 0 .  albuterol (PROVENTIL) (2.5 MG/3ML) 0.083% nebulizer solution, USE  ONE VIAL IN NEBULIZER EVERY 4 HOURS AS NEEDED FOR WHEEZING OR SHORTNESS OF BREATH, Disp: 75 mL, Rfl: 3 .  aspirin 81 MG chewable tablet, Chew 81 mg by mouth daily., Disp: , Rfl:  .  atorvastatin (LIPITOR) 40 MG tablet, Take 1 tablet (40 mg total) by mouth daily., Disp: 90 tablet, Rfl: 3 .  buPROPion (WELLBUTRIN XL) 150 MG 24 hr tablet, Take 1 tablet (150 mg total) by mouth daily., Disp: 30 tablet, Rfl: 6 .  cetirizine (ZYRTEC) 10 MG tablet, Take 10 mg by mouth daily., Disp: , Rfl:  .  escitalopram (LEXAPRO) 20 MG tablet, 1/2 tab po qd, Disp: 30 tablet, Rfl: 3 .  fluticasone (FLOVENT HFA) 220 MCG/ACT inhaler, Inhale 2 puffs into the lungs 2 (two) times daily., Disp: 12 g, Rfl: 2 .  furosemide (LASIX) 20 MG tablet, Take 1 tablet (20 mg total) by mouth every other day., Disp: 90 tablet, Rfl: 1 .  ibandronate (BONIVA) 150 MG tablet, Take 1 tablet (150 mg total) by mouth every 30 (thirty) days. Take in the morning with a full glass of water, on an empty stomach, and do not take anything else by mouth or lie down for the next 30 min., Disp: 3 tablet, Rfl: 3 .  Insulin Isophane & Regular Human (HUMULIN 70/30 KWIKPEN) (70-30) 100 UNIT/ML PEN, Take a base dose of 9 U SQ at breakfast and 9 U SQ at evening meal + add additional units PER SLIDING SCALE EVERY MORNING AND EVENING 140-199 2UNITS, 200-250 4 UNITS, 251-299 6 UNITS, 300-349 8 UNITS, 350 OR ABOVE 10 UNITS, Disp: 15 pen, Rfl: 1 .  metFORMIN (GLUCOPHAGE) 500 MG tablet, TAKE 1 TABLET BY MOUTH TWICE DAILY WITH A MEAL, Disp: 180 tablet, Rfl: 0 .  metoprolol tartrate (LOPRESSOR) 25 MG tablet, TAKE 1 TABLET BY MOUTH IN THE MORNING AND TAKE 2 TABLETS BY MOUTH AT BEDTIME, Disp: 270 tablet, Rfl: 1 .  ONE TOUCH ULTRA TEST test strip, USE  STRIP TO CHECK GLUCOSE THREE TIMES DAILY, Disp: 100 each, Rfl: 11 .  ONETOUCH DELICA LANCETS 38H MISC, USE 1  TO CHECK GLUCOSE THREE TIMES DAILY, Disp: 100 each, Rfl: 11 .  pantoprazole (PROTONIX) 40 MG tablet, TAKE 1 TABLET BY  MOUTH ONCE DAILY, Disp: 90 tablet, Rfl: 3 .  potassium chloride (K-DUR) 10 MEQ tablet, Take 1 tablet by mouth once daily, Disp: 90 tablet, Rfl: 0  EXAM:  VITALS per patient if applicable: BP 147/82 (BP Location: Left Arm, Patient Position: Sitting, Cuff Size: Normal)   Pulse 88   Temp 99.2 F (37.3 C) (Temporal)   Wt 155 lb 3.2 oz (70.4 kg)   BMI 28.39 kg/m    GENERAL: alert, oriented, appears well and in no acute distress  HEENT: atraumatic, conjunttiva clear, no obvious abnormalities on inspection of external nose and ears  NECK: normal movements of the head and neck  LUNGS: on inspection no signs of respiratory distress, breathing rate appears normal, no obvious gross SOB, gasping or wheezing  CV: no obvious cyanosis  MS: moves all visible extremities without noticeable abnormality  PSYCH/NEURO: pleasant and cooperative, no obvious depression or anxiety, speech and thought processing grossly intact  Labs: none today  Lab Results  Component Value Date   HGBA1C 7.3 (H) 11/12/2018     Chemistry      Component Value Date/Time   NA 140 11/12/2018 1001   NA 141 09/19/2018 0918   NA 141 01/15/2015 0848   K 4.9 11/12/2018 1001   K 4.0 01/15/2015 0848   CL 102 11/12/2018 1001   CL 105 02/18/2013 0812   CO2 28 11/12/2018 1001   CO2 25 01/15/2015 0848   BUN 13 11/12/2018 1001   BUN 16 09/19/2018 0918   BUN 9.0 01/15/2015 0848   CREATININE 1.05 11/12/2018 1001   CREATININE 1.01 (H) 08/10/2018 1611   CREATININE 0.7 01/15/2015 0848      Component Value Date/Time   CALCIUM 9.1 11/12/2018 1001   CALCIUM 9.2 01/15/2015 0848   ALKPHOS 92 09/19/2018 0918   ALKPHOS 92 01/15/2015 0848   AST 20 09/19/2018 0918   AST 19 01/15/2015 0848   ALT 17 09/19/2018 0918   ALT 20 01/15/2015 0848   BILITOT 0.3 09/19/2018 0918   BILITOT 0.34 01/15/2015 0848     Lab Results  Component Value Date   CHOL 111 09/19/2018   HDL 34 (L) 09/19/2018   LDLCALC 48 09/19/2018   LDLDIRECT  162.0 07/17/2015   TRIG 147 09/19/2018   CHOLHDL 3.3 09/19/2018   Lab Results  Component Value Date   TSH 1.24 04/20/2017    ASSESSMENT AND PLAN:  Discussed the following assessment and plan:  1) DM 2; stable. No labs today.  Her A1c is NOT essential at this time, since report of glucoses from home CBG checks are good. Continue current insulin regimen + metformin.  2) CRI III: I am constantly struggling to get pt to increase intake of clear fluids (her daughter is always on pt's case about this as well) but she simply won't do it. Her renal function has been stable, so we'll repeat this at next f/u in 3 mo (see statement below about labs).  3) MDD: improved but plateaued since changing regimen to lexapro 82m (down from 271mqd) + wellbutrin xl 15074md. Will continue wellbutrin xl 150m71m and increase lexapro back to 20mg76m Consider addition of depakote in future if still having problems with mood/irritability.  Due to pt and her husband with big RF's for severe dz if they were to get COVID 19, will defer lab work (A1c and BMET) until next o/v in 3 mo.  I discussed the assessment and treatment plan with the patient. The patient was provided an opportunity to ask questions and all were answered. The patient agreed with the plan and demonstrated an understanding of the instructions.   The patient was advised to call back  or seek an in-person evaluation if the symptoms worsen or if the condition fails to improve as anticipated.  F/u: 3 mo  Signed:  Crissie Sickles, MD           02/11/2019

## 2019-02-18 ENCOUNTER — Other Ambulatory Visit: Payer: Self-pay | Admitting: *Deleted

## 2019-02-18 NOTE — Patient Outreach (Signed)
[facility]) Care Management  02/18/2019  [name] 05/03/1946 [id number]   Telephone Assessment-COPD  RN spoke with pt daughter Delores with update on pt's ongoing management of care. Will verify pt's continues to follow up with her providers via telephone and daughter has indicated she take the pt's vitals and reports to the providers as needed. No acute needs at this time as RN will review and continue to encouraged adherence with the plan of care in place at this time.  Daughter reports pt continues to do well with managing her COPD using her inhalers and nebs as prescribed. Reports no acute or flare ups related to her COPD as pt is doing well. Discussed the current plan of care and updated the interventions accordingly based upon the pt's progress. Will continue to encouraged ongoing self management of care and offer any community resources that maybe helpful with her ongoing management of care (none needed at this time). Will update all interventions accordingly based upon pt's progress as noted below.  [facility] CM Care Plan Problem One     Most Recent Value  Care Plan Problem One  Knowledge deficit related to COPD management  Role Documenting the Problem One  Care Management Coordinator  Care Plan for Problem One  Active  [facility] Long Term Goal   Pt/caregiver will be able to recite two symptoms of COPD exacerbation and what to do if acute within 90 days.  [facility] Long Term Goal Start Date  12/14/18  Interventions for Problem One Long Term Goal  Will verified iwth caregiver pt's understanding when to notify her or her provider with any exacerbated symptoms related to her COPD. Will strongly encouraged caregiver to discuss the action plan for early prevention concerning COPD flare-up of related symptoms. Will also verify pt has not experienced any of the discussed symtpoms today over the last month. Will continue to encourage caregiver to educated on the importance of using her  inhalers and nebs to prevent COPD exacerbation. Will re-evaluate within then the next month on pt's ongoing management of care and progress related.       Raina Mina, RN Care Management Coordinator [place] Office [phone]

## 2019-03-06 ENCOUNTER — Other Ambulatory Visit: Payer: Self-pay | Admitting: Family Medicine

## 2019-03-06 DIAGNOSIS — Z1231 Encounter for screening mammogram for malignant neoplasm of breast: Secondary | ICD-10-CM

## 2019-03-21 ENCOUNTER — Other Ambulatory Visit: Payer: Self-pay

## 2019-03-21 ENCOUNTER — Encounter (INDEPENDENT_AMBULATORY_CARE_PROVIDER_SITE_OTHER): Payer: Medicare Other | Admitting: Ophthalmology

## 2019-03-21 DIAGNOSIS — H35033 Hypertensive retinopathy, bilateral: Secondary | ICD-10-CM | POA: Diagnosis not present

## 2019-03-21 DIAGNOSIS — E113591 Type 2 diabetes mellitus with proliferative diabetic retinopathy without macular edema, right eye: Secondary | ICD-10-CM

## 2019-03-21 DIAGNOSIS — H35372 Puckering of macula, left eye: Secondary | ICD-10-CM

## 2019-03-21 DIAGNOSIS — E113512 Type 2 diabetes mellitus with proliferative diabetic retinopathy with macular edema, left eye: Secondary | ICD-10-CM | POA: Diagnosis not present

## 2019-03-21 DIAGNOSIS — I10 Essential (primary) hypertension: Secondary | ICD-10-CM

## 2019-03-21 DIAGNOSIS — H43813 Vitreous degeneration, bilateral: Secondary | ICD-10-CM

## 2019-03-21 DIAGNOSIS — E11311 Type 2 diabetes mellitus with unspecified diabetic retinopathy with macular edema: Secondary | ICD-10-CM | POA: Diagnosis not present

## 2019-03-22 ENCOUNTER — Other Ambulatory Visit: Payer: Self-pay | Admitting: *Deleted

## 2019-03-22 NOTE — Patient Outreach (Signed)
[facility]) Care Management  03/22/2019  LENEA BYWATER [birth year] [id number]   Telephone Assessment   RN spoke with pt's daughter [name] and received an update on pt's ongoing management of care. Daughter states pt is doing a lot better with using her nebulizer and inhalers for her COPD. RN able to verify pt remains in the GREEN zone with no acute symptoms over the previous month. States pt is breathing well with no exacerbation reported. Visited the eye provider on yesterday due to diabetes as a routine follow up visit. RN discussed the current plan of care and review all goals and interventions adjusted according to pt's progress.   Reviewed and discussed plan to refer to a Health Coach based upon pt's progress for ongoing disease management of care. Daughter has agreed to this plan of care and very appreciative for the management of care over the last several months. Will update Dr. Anitra Lauth and refer to a [place] for ongoing services. No other request or inquires at this time and this discipline will be off the team.  [facility] CM Care Plan Problem One     Most Recent Value  Care Plan Problem One  Knowledge deficit related to COPD management  Role Documenting the Problem One  Care Management [place] for Problem One  Active  [facility] Long Term Goal   Pt/caregiver will be able to recite two symptoms of COPD exacerbation and what to do if acute within 90 days.  [facility] Long Term Goal Start Date  12/14/18  Interventions for Problem One Long Term Goal  Ongoing review to the caregiver and pt are needed. Will review COPD and againthe risk involved if unmonitored. Will refer to Health Coach for ongoing disease management services to allow adherence. in recognizing symptoms of COPD. Verified pt remains in the GREEN zone with an acute flare up of symptoms.       Raina Mina, RN Care Management Coordinator [place] Office [phone]

## 2019-04-13 ENCOUNTER — Other Ambulatory Visit: Payer: Self-pay | Admitting: Cardiology

## 2019-04-19 ENCOUNTER — Other Ambulatory Visit: Payer: Self-pay | Admitting: *Deleted

## 2019-04-19 NOTE — Patient Outreach (Signed)
[name] [facility]) Care Management  04/19/2019   [name] Feb 18, 1946 [id number]  RN Health Coach telephone call to patient.  [employer] compliance verified. Per patient she is doing good. Patient does have a cough in which she stated she has had it for a long time that she doesn't even notice. Patient stated she has been exercising a little in the bed in the morning doing some stretching. Patient appetite is fair. Patient is using medications as per ordered verified by husband. Patient gets short of breath when talking for a while. Patient ambulates with a walker. Per patient and husband she fell 2 weeks ago. She had a little bruising but no injuries.  Per patient she has not been going out. Fasting blood sugar today was 125. Patient has agreed to further outreach calls.   Current Medications:  Current Outpatient Medications  Medication Sig Dispense Refill  . acetaminophen (TYLENOL) 500 MG tablet Take 500-1,000 mg by mouth every 6 (six) hours as needed for headache (or pain).     Marland Kitchen albuterol (PROVENTIL HFA;VENTOLIN HFA) 108 (90 Base) MCG/ACT inhaler INHALE 2 PUFFS BY MOUTH EVERY 6 HOURS AS NEEDED FOR WHEEZING OR SHORTNESS OF BREATH 18 g 0  . albuterol (PROVENTIL) (2.5 MG/3ML) 0.083% nebulizer solution USE ONE VIAL IN NEBULIZER EVERY 4 HOURS AS NEEDED FOR WHEEZING OR SHORTNESS OF BREATH 75 mL 3  . aspirin 81 MG chewable tablet Chew 81 mg by mouth daily.    Marland Kitchen atorvastatin (LIPITOR) 40 MG tablet Take 1 tablet (40 mg total) by mouth daily. 90 tablet 3  . buPROPion (WELLBUTRIN XL) 150 MG 24 hr tablet Take 1 tablet (150 mg total) by mouth daily. 30 tablet 6  . cetirizine (ZYRTEC) 10 MG tablet Take 10 mg by mouth daily.    Marland Kitchen escitalopram (LEXAPRO) 20 MG tablet 1 tab po qd 30 tablet 3  . fluticasone (FLOVENT HFA) 220 MCG/ACT inhaler Inhale 2 puffs into the lungs 2 (two) times daily. 12 g 2  . furosemide (LASIX) 20 MG tablet Take 1 tablet (20 mg total) by mouth every other day. 90 tablet 1   . ibandronate (BONIVA) 150 MG tablet Take 1 tablet (150 mg total) by mouth every 30 (thirty) days. Take in the morning with a full glass of water, on an empty stomach, and do not take anything else by mouth or lie down for the next 30 min. 3 tablet 3  . Insulin Isophane & Regular Human (HUMULIN 70/30 KWIKPEN) (70-30) 100 UNIT/ML PEN Take a base dose of 9 U SQ at breakfast and 7 U SQ at evening meal + add additional units PER SLIDING SCALE EVERY MORNING AND EVENING 140-199 2UNITS, 200-250 4 UNITS, 251-299 6 UNITS, 300-349 8 UNITS, 350 OR ABOVE 10 UNITS 15 pen 1  . metFORMIN (GLUCOPHAGE) 500 MG tablet TAKE 1 TABLET BY MOUTH TWICE DAILY WITH A MEAL 180 tablet 0  . metoprolol tartrate (LOPRESSOR) 25 MG tablet TAKE 1 TABLET BY MOUTH IN THE MORNING AND 2 AT BEDTIME 270 tablet 1  . ONE TOUCH ULTRA TEST test strip USE  STRIP TO CHECK GLUCOSE THREE TIMES DAILY 100 each 11  . ONETOUCH DELICA LANCETS 29J MISC USE 1  TO CHECK GLUCOSE THREE TIMES DAILY 100 each 11  . pantoprazole (PROTONIX) 40 MG tablet TAKE 1 TABLET BY MOUTH ONCE DAILY 90 tablet 3  . potassium chloride (K-DUR) 10 MEQ tablet Take 1 tablet by mouth once daily 90 tablet 0   No current facility-administered medications  for this visit.     Functional Status:  In your present state of health, do you have any difficulty performing the following activities: 12/14/2018 12/14/2018  Hearing? [name]  Vision? N -  Difficulty concentrating or making decisions? [name]  Walking or climbing stairs? Y Y  Dressing or bathing? Y Y  Doing errands, shopping? [name]  Preparing Food and eating ? N -  Using the Toilet? Y -  In the past six months, have you accidently leaked urine? Y -  Do you have problems with loss of bowel control? Y -  Managing your Medications? Y Y  Managing your Finances? [name]  Housekeeping or managing your Housekeeping? Y Y  Some recent data might be hidden    Fall/Depression Screening: Fall Risk  04/19/2019 12/14/2018 11/12/2018  Falls in the  past year? 1 1 1   Comment - - -  Number falls in past yr: 1 1 1   Comment - - -  Injury with Fall? 0 1 0  Risk Factor Category  - - -  Risk for fall due to : History of fall(s);Impaired balance/gait;Impaired mobility History of fall(s);Impaired balance/gait;Impaired mobility;Mental status change Other (Comment)  Risk for fall due to: Comment - - tripping  Follow up Falls evaluation completed;Education provided;Falls prevention discussed Education provided Falls prevention discussed  Comment - - -   PHQ 2/9 Scores 04/19/2019 12/14/2018 12/13/2018 11/12/2018 08/10/2018 05/10/2018 02/08/2018  PHQ - 2 Score 3 1 3 6 5 4 1   PHQ- 9 Score 8 - 19 22 16 15 9     Assessment:  Patient is taking medications as per ordered Patient fell 2 weeks ago bruising not injury Patient appetite is fair Patient ambulates with a walker Patient will benefit from [place] telephonic outreach for education and support for COPD self management.  Plan:  RN discussed COPD action p[lan RN discussed patient appetite RN discussed any exercise that patient may do RN sent Clinical Key for COPD exacerbation RN sent Clinical Key for  Eating Plan for COPD RN sent Clinical Key for  COPD and physical activity RN sent barriers letter and assessment to PCP RN will follow up within the month of September  Reigan Tolliver [place] Management [phone]

## 2019-04-28 ENCOUNTER — Other Ambulatory Visit: Payer: Self-pay | Admitting: Family Medicine

## 2019-04-30 ENCOUNTER — Other Ambulatory Visit: Payer: Self-pay

## 2019-04-30 ENCOUNTER — Ambulatory Visit
Admission: RE | Admit: 2019-04-30 | Discharge: 2019-04-30 | Disposition: A | Discharge status: Home / Self Care | Payer: Medicare Other | Source: Ambulatory Visit | Attending: Family Medicine | Admitting: Family Medicine

## 2019-04-30 DIAGNOSIS — Z1231 Encounter for screening mammogram for malignant neoplasm of breast: Secondary | ICD-10-CM

## 2019-05-06 ENCOUNTER — Other Ambulatory Visit: Payer: Self-pay

## 2019-05-06 MED ORDER — IBANDRONATE SODIUM 150 MG PO TABS: 150.0000 mg | ORAL_TABLET | ORAL | 3 refills | Status: DC

## 2019-05-06 NOTE — Telephone Encounter (Signed)
RF request for Boniva LOV: 02/11/19 Next ov: advised to f/u 3 months Last written: 04/13/18 (3,3)  Please advise, thanks. Medication pending

## 2019-05-10 NOTE — Telephone Encounter (Signed)
This encounter was created in error - please disregard.

## 2019-05-12 ENCOUNTER — Other Ambulatory Visit: Payer: Self-pay | Admitting: Family Medicine

## 2019-05-13 NOTE — Telephone Encounter (Signed)
I'll RF potassium. Pt needs o/v for routine DM f/u sometime in the next month.  (Either telemedicine or in-office is ok with me).

## 2019-05-13 NOTE — Telephone Encounter (Signed)
Patient due for OV, unable to contact patient - no answer, no VM.    Please advise if okay to authorize potassium chloride?

## 2019-05-13 NOTE — Telephone Encounter (Signed)
Patient scheduled OV 05/29/2019.

## 2019-05-18 ENCOUNTER — Other Ambulatory Visit: Payer: Self-pay | Admitting: Family Medicine

## 2019-05-20 ENCOUNTER — Other Ambulatory Visit: Payer: Self-pay | Admitting: Family Medicine

## 2019-05-29 ENCOUNTER — Encounter: Payer: Self-pay | Admitting: Family Medicine

## 2019-05-29 ENCOUNTER — Other Ambulatory Visit: Payer: Self-pay

## 2019-05-29 ENCOUNTER — Ambulatory Visit (INDEPENDENT_AMBULATORY_CARE_PROVIDER_SITE_OTHER): Payer: Medicare Other | Admitting: Family Medicine

## 2019-05-29 VITALS — BP 102/53 | HR 77 | Temp 98.2°F

## 2019-05-29 DIAGNOSIS — N183 Chronic kidney disease, stage 3 unspecified: Secondary | ICD-10-CM

## 2019-05-29 DIAGNOSIS — F330 Major depressive disorder, recurrent, mild: Secondary | ICD-10-CM

## 2019-05-29 DIAGNOSIS — E7800 Pure hypercholesterolemia, unspecified: Secondary | ICD-10-CM

## 2019-05-29 DIAGNOSIS — E118 Type 2 diabetes mellitus with unspecified complications: Secondary | ICD-10-CM

## 2019-05-29 DIAGNOSIS — N2889 Other specified disorders of kidney and ureter: Secondary | ICD-10-CM

## 2019-05-29 NOTE — Progress Notes (Signed)
Virtual Visit via Video Note  I connected with pt on 05/29/19 at 11:30 AM EDT by a video enabled telemedicine application and verified that I am speaking with the correct person using two identifiers.  Location patient: home Location provider:work or home office Persons participating in the virtual visit: patient, pt's daughter [name], provider  I discussed the limitations of evaluation and management by telemedicine and the availability of in person appointments. The patient expressed understanding and agreed to proceed.  Telemedicine visit is a necessity given the COVID-19 restrictions in place at the current time.  HPI: 73 y/o WF being seen today for 3 mo f/u DM 2, recent MDD, and CRI III (GFR in the 31s). Labs 6 mo ago showed stable HbA1c, stable renal fxn, and normal electrolytes. No changes in her med regimen were made.  Of note, she lives with her husband who has malignancy and is chronically immunosuppressed and has chronic hypoxic RF from COPD.  SHE AND HER DAUGHTER  REPORT SHE IS DOING WELL. DM 2:   No changes in regiment made last visit. Metformin 500 bid. Taking 70/30 9 U qAM and 7 U qSupper (this is base dosing, then add sliding scale which is typically not more than 4 units). Fasting glucoses last f/u 118-150 typically.  Since then->avg 130 Evening glucoses ((pre supper) last f/u 95-140 typically.  Since then ->avg 120s. No hypoglycemia.  Eating well. Water intake is improved. She avoids NSAIDs.  Her mood is improved since last f/u.  Has more desire to interact, less "crabby" and irritable.  No crying spells.  No recent confusion problems.  ROS: no CP, no SOB, no wheezing, no cough, no dizziness, no HAs, no rashes, no melena/hematochezia.  No polyuria or polydipsia.  No myalgias or arthralgias.   Past Medical History:  Diagnosis Date  . Age-related nuclear cataract of both eyes 2016   +cortical age related cataracts OU   . Allergy    seasonal  . Arthritis     hands  . Bilateral diabetic retinopathy (HCC) 2015   Dr. Zigmund Daniel  . Blood transfusion without reported diagnosis    when taking chemo  . CKD (chronic kidney disease), stage II    GFR 60s  . 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  Marland Kitchen COPD (chronic obstructive pulmonary disease) ([place])   . CVA (cerebral vascular accident) ([place]) 09/2016   MRI did show a left-sided small ischemic stroke which was acute but likely incidental (MRI was done b/c pt had TIA sx's in hospital).  Neuro put her on plavix at that time.  Cardiology d/c'd her plavix 08/2017.  . Diabetes mellitus with complication (HCC)    diab retinopathy OU (laser)  . GERD (gastroesophageal reflux disease)    protonix  . History of concussion 08/25/2018   w/out loss of consciousness--mild increase in baseline memory impairment after.  CT head neg acute.  Marland Kitchen History of diverticulitis of colon    with abscess; required IR percutaneous drain placed 09/2016.  Marland Kitchen Hyperlipidemia   . Left bundle branch block (LBBB) 12/16/2016  . Lung cancer ([place])    non-small cell lung ca, stage III in 05/2011; systemic chemotherapy concurrent with radiation followed by prophylactic cranial irradiation and has been observation since July of 2010 with no evidence for disease recurrence-released from onc f/u 12/2014 (needs annual cxr by PCP).  CXR 08/2015 stable.  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 Earlie Server did bx and this was NEG for malignancy.  . Mild  cognitive impairment with memory loss    Likely from brain radiation therapy  . Non-obstructive CAD (coronary artery disease)    a. Cath 2006 preserved LV fxn, scattered irregularities without critical stenosis; b. 2008 stress echo negative for ischemia, but with hypertensive response; c. 08/2016 Cath: D1 25%, otw nl.  . Normocytic anemia 2018   Iron, vit B12, folate all normal 12/2016.  Marland Kitchen Open-angle glaucoma 2016   Dr. Shirley Muscat, (bilateral)---responding to topical therapy   . Osteoporosis 03/2016; 03/2018   03/2016 DEXA T-score -2.2.  03/2018 T-score L femoral neck  -2.7--boniva started.  . Pneumonia    hospitalization 11/2016; hypoxemic resp failure--d/c'd home with home oxygen therapy  . Subclinical hyperthyroidism 2018  . Takotsubo cardiomyopathy    a. 08/2016 Echo: EF 30-35%, gr1DD, PASP 101mHg;  b. 08/2016 Cath: nonobs Dzs;  c. 11/2016 Echo: EF 40-45%, no rwma, nl RV fxn, mild TR, PASP 3109mg.  ECHO 02/2017 EF 50-55%, grd II DD---essentially resolved Takotsubo 02/2017.  . Torsades de pointes (HCPaul Smiths11/2017   a. 08/2016 in setting of diverticulitis & pneumoperitoneum and Takotsubo CM -->prolonged QT, seen by EP-->avoid meds with potential for QT prolongation.    Past Surgical History:  Procedure Laterality Date  . ABDOMINAL HYSTERECTOMY  1997  . APPENDECTOMY    . CARDIAC CATHETERIZATION N/A 09/14/2016   Procedure: Left Heart Cath and Coronary Angiography;  Surgeon: ThTroy SineMD;  [facility];  Service: Cardiovascular; Mild nonobstructive CAD with 85% smooth narrowing in the first diagonal branch of the LAD; 10% smooth narrowing of the ostial proximal left circumflex coronary artery; and a normal dominant RCA.  . Marland KitchenARPAL TUNNEL RELEASE    . CATARACT EXTRACTION    . CHOLECYSTECTOMY  2002  . COLONOSCOPY  10/24/00   normal.  [facility] hemoccult testing via [place]/25/16 was NEG  . dexa  03/2016; 03/2018   03/2018 T-score L femoral neck  -2.7 (worsened compared to osteopenic range 2017.)  . IR GENERIC HISTORICAL  09/19/2016   IR SINUS/FIST TUBE CHK-NON GI 09/19/2016 JaCorrie MckusickDO [facility]  . IR GENERIC HISTORICAL  10/06/2016   IR RADIOLOGIST EVAL & MGMT 10/06/2016 [facility]  . IR GENERIC HISTORICAL  10/26/2016   IR RADIOLOGIST EVAL & MGMT 10/26/2016 [facility]  . IR GENERIC HISTORICAL  11/03/2016   IR RADIOLOGIST EVAL & MGMT 11/03/2016 WeArdis RowanPA-C [facility]  . IR GENERIC HISTORICAL  11/24/2016   IR  RADIOLOGIST EVAL & MGMT 11/24/2016 WeArdis RowanPA-C [facility]  . IR GENERIC HISTORICAL  12/05/2016   Fistula smaller/improving.  IR SINUS/FIST TUBE CHK-NON GI 12/05/2016 JoSandi MariscalMD [facility]  . IR GENERIC HISTORICAL  12/22/2016   IR RADIOLOGIST EVAL & MGMT 12/22/2016 MiGreggory KeenMD [facility]  . IR RADIOLOGIST EVAL & MGMT  01/10/2017  . IR RADIOLOGIST EVAL & MGMT  02/09/2017  . IR SINUS/FIST TUBE CHK-NON GI  03/28/2017  . LEFT HEART CATHETERIZATION WITH CORONARY ANGIOGRAM N/A 05/30/2012   Procedure: LEFT HEART CATHETERIZATION WITH CORONARY ANGIOGRAM;  Surgeon: ChBurnell BlanksMD;  [facility];  Service: Cardiovascular: Mild, non-obstructive CAD  . TRANSTHORACIC ECHOCARDIOGRAM  09/14/2016; 11/2016   08/2016: EF 30-35 %, Akinesis of the mid-apicalanteroseptal myocardium.  Grade I DD.  Mild pulm HTN.  Repeat echo 11/2016, EF 40-45%, no rwma, nl RV fxn, mild TR, PASP 3797m.    . TMarland KitchenANSTHORACIC ECHOCARDIOGRAM  03/07/2017   EF 50-55%. Hypokinesis  of the distal septum with overall low normal LV,  systolic function; mild diastolic dysfunction with elevated LV  filling pressure; mildly calcified aortic valve with mild AI; small pericardial effusion    Family History  Problem Relation Age of Onset  . Heart failure Mother   . Kidney disease Mother        renal failure  . Diabetes Mother   . Diabetes Father   . Diabetes Brother   . Heart disease Brother   . Heart attack Brother   . Heart attack Brother   . Heart attack Brother   . Colon cancer Neg Hx       Current Outpatient Medications:  .  acetaminophen (TYLENOL) 500 MG tablet, Take 500-1,000 mg by mouth every 6 (six) hours as needed for headache (or pain). , Disp: , Rfl:  .  albuterol (PROVENTIL HFA;VENTOLIN HFA) 108 (90 Base) MCG/ACT inhaler, INHALE 2 PUFFS BY MOUTH EVERY 6 HOURS AS NEEDED FOR WHEEZING OR SHORTNESS OF BREATH, Disp: 18 g, Rfl: 0 .  albuterol (PROVENTIL) (2.5 MG/3ML) 0.083% nebulizer  solution, USE ONE VIAL IN NEBULIZER EVERY 4 HOURS AS NEEDED FOR WHEEZING OR SHORTNESS OF BREATH, Disp: 75 mL, Rfl: 3 .  aspirin 81 MG chewable tablet, Chew 81 mg by mouth daily., Disp: , Rfl:  .  atorvastatin (LIPITOR) 40 MG tablet, Take 1 tablet (40 mg total) by mouth daily., Disp: 90 tablet, Rfl: 3 .  buPROPion (WELLBUTRIN XL) 150 MG 24 hr tablet, Take 1 tablet (150 mg total) by mouth daily., Disp: 30 tablet, Rfl: 6 .  cetirizine (ZYRTEC) 10 MG tablet, Take 10 mg by mouth daily., Disp: , Rfl:  .  escitalopram (LEXAPRO) 20 MG tablet, 1 tab po qd, Disp: 30 tablet, Rfl: 3 .  FLOVENT HFA 220 MCG/ACT inhaler, Inhale 2 puffs by mouth twice daily, Disp: 12 g, Rfl: 0 .  furosemide (LASIX) 20 MG tablet, Take 1 tablet (20 mg total) by mouth every other day., Disp: 90 tablet, Rfl: 1 .  ibandronate (BONIVA) 150 MG tablet, Take 1 tablet (150 mg total) by mouth every 30 (thirty) days. Take in the morning with a full glass of water, on an empty stomach, and do not take anything else by mouth or lie down for the next 30 min., Disp: 3 tablet, Rfl: 3 .  Insulin Isophane & Regular Human (HUMULIN 70/30 KWIKPEN) (70-30) 100 UNIT/ML PEN, Take a base dose of 9 U SQ at breakfast and 7 U SQ at evening meal + add additional units PER SLIDING SCALE EVERY MORNING AND EVENING 140-199 2UNITS, 200-250 4 UNITS, 251-299 6 UNITS, 300-349 8 UNITS, 350 OR ABOVE 10 UNITS, Disp: 15 pen, Rfl: 1 .  metFORMIN (GLUCOPHAGE) 500 MG tablet, TAKE 1 TABLET BY MOUTH TWICE DAILY WITH A MEAL, Disp: 180 tablet, Rfl: 0 .  metoprolol tartrate (LOPRESSOR) 25 MG tablet, TAKE 1 TABLET BY MOUTH IN THE MORNING AND 2 AT BEDTIME, Disp: 270 tablet, Rfl: 1 .  ONE TOUCH ULTRA TEST test strip, USE  STRIP TO CHECK GLUCOSE THREE TIMES DAILY, Disp: 100 each, Rfl: 11 .  ONETOUCH DELICA LANCETS 76H MISC, USE 1  TO CHECK GLUCOSE THREE TIMES DAILY, Disp: 100 each, Rfl: 11 .  pantoprazole (PROTONIX) 40 MG tablet, TAKE 1 TABLET BY MOUTH ONCE DAILY, Disp: 90 tablet, Rfl:  3 .  potassium chloride (K-DUR) 10 MEQ tablet, Take 1 tablet by mouth once daily, Disp: 90 tablet, Rfl: 0  EXAM:  VITALS per patient if applicable: BP Marland Kitchen)  102/53 (BP Location: Left Arm, Patient Position: Sitting, Cuff Size: Normal)   Pulse 77   Temp 98.2 F (36.8 C) (Temporal)    GENERAL: alert, oriented, appears well and in no acute distress  HEENT: atraumatic, conjunttiva clear, no obvious abnormalities on inspection of external nose and ears  NECK: normal movements of the head and neck  LUNGS: on inspection no signs of respiratory distress, breathing rate appears normal, no obvious gross SOB, gasping or wheezing  CV: no obvious cyanosis  MS: moves all visible extremities without noticeable abnormality  PSYCH/NEURO: pleasant and cooperative, no obvious depression or anxiety, speech and thought processing grossly intact  LABS: none today    Chemistry      Component Value Date/Time   NA 140 11/12/2018 1001   NA 141 09/19/2018 0918   NA 141 01/15/2015 0848   K 4.9 11/12/2018 1001   K 4.0 01/15/2015 0848   CL 102 11/12/2018 1001   CL 105 02/18/2013 0812   CO2 28 11/12/2018 1001   CO2 25 01/15/2015 0848   BUN 13 11/12/2018 1001   BUN 16 09/19/2018 0918   BUN 9.0 01/15/2015 0848   CREATININE 1.05 11/12/2018 1001   CREATININE 1.01 (H) 08/10/2018 1611   CREATININE 0.7 01/15/2015 0848      Component Value Date/Time   CALCIUM 9.1 11/12/2018 1001   CALCIUM 9.2 01/15/2015 0848   ALKPHOS 92 09/19/2018 0918   ALKPHOS 92 01/15/2015 0848   AST 20 09/19/2018 0918   AST 19 01/15/2015 0848   ALT 17 09/19/2018 0918   ALT 20 01/15/2015 0848   BILITOT 0.3 09/19/2018 0918   BILITOT 0.34 01/15/2015 0848     Lab Results  Component Value Date   HGBA1C 7.3 (H) 11/12/2018   Lab Results  Component Value Date   CHOL 111 09/19/2018   HDL 34 (L) 09/19/2018   LDLCALC 48 09/19/2018   LDLDIRECT 162.0 07/17/2015   TRIG 147 09/19/2018   CHOLHDL 3.3 09/19/2018   Lab Results   Component Value Date   WBC 7.0 08/25/2018   HGB 11.1 (L) 08/25/2018   HCT 36.9 08/25/2018   MCV 94.1 08/25/2018   PLT 215 08/25/2018    ASSESSMENT AND PLAN:  Discussed the following assessment and plan:  1) DM 2: stable, good control per home gluc monitoring. Lab visit for A1c and CMET.  2) CRI III: doing better with fluid intake. Avoids NSAIDs. Future lytes/cr.  If GFR drops below 45 ml/min then we'll d/c her metformin.  3) MDD: she is doing much better now: continue lexapro 44m qd and wellbutrin 1534mqd.  I discussed the assessment and treatment plan with the patient. The patient was provided an opportunity to ask questions and all were answered. The patient agreed with the plan and demonstrated an understanding of the instructions.   The patient was advised to call back or seek an in-person evaluation if the symptoms worsen or if the condition fails to improve as anticipated.  F/u: 3 mo RCI  Signed:  PhCrissie SicklesMD           05/29/2019

## 2019-05-30 ENCOUNTER — Ambulatory Visit (INDEPENDENT_AMBULATORY_CARE_PROVIDER_SITE_OTHER): Payer: Medicare Other

## 2019-05-30 ENCOUNTER — Telehealth: Payer: Self-pay

## 2019-05-30 DIAGNOSIS — N183 Chronic kidney disease, stage 3 unspecified: Secondary | ICD-10-CM

## 2019-05-30 DIAGNOSIS — E7800 Pure hypercholesterolemia, unspecified: Secondary | ICD-10-CM

## 2019-05-30 DIAGNOSIS — E118 Type 2 diabetes mellitus with unspecified complications: Secondary | ICD-10-CM

## 2019-05-30 LAB — COMPREHENSIVE METABOLIC PANEL
ALT: 12 U/L (ref 0–35)
AST: 16 U/L (ref 0–37)
Albumin: 4.2 g/dL (ref 3.5–5.2)
Alkaline Phosphatase: 65 U/L (ref 39–117)
BUN: 15 mg/dL (ref 6–23)
CO2: 27 mEq/L (ref 19–32)
Calcium: 9.1 mg/dL (ref 8.4–10.5)
Chloride: 102 mEq/L (ref 96–112)
Creatinine, Ser: 1.04 mg/dL (ref 0.40–1.20)
GFR: 52.25 mL/min — ABNORMAL LOW (ref >60.00)
Glucose, Bld: 38 mg/dL — CL (ref 70–99)
Potassium: 4.6 mEq/L (ref 3.5–5.1)
Sodium: 138 mEq/L (ref 135–145)
Total Bilirubin: 0.3 mg/dL (ref 0.2–1.2)
Total Protein: 6.9 g/dL (ref 6.0–8.3)

## 2019-05-30 LAB — HEMOGLOBIN A1C: Hgb A1c MFr Bld: 6.1 % (ref 4.6–6.5)

## 2019-05-30 NOTE — Telephone Encounter (Signed)
CRITICAL VALUE STICKER  CRITICAL VALUE: Glucose is 38, drawn at 1:48 pm  RECEIVER Doran Clay, [place] NOTIFIED: 8.6.20 @ 4:07 pm  [facility]  MD NOTIFIED: [place]

## 2019-05-30 NOTE — Telephone Encounter (Signed)
Spoke with [place] and pt is feeling better. She had BF but did not eat lunch. She gave herself 13U of Humulin 70/30. It was 12 when pt's daughter checked it around 2:45pm.

## 2019-05-30 NOTE — Telephone Encounter (Signed)
Pls call pt ([name]) and make sure she's feeling ok, tell them her glucose was very low earlier today. See if she skipped BF or lunch today and see what dose of insulin she took this morning.-thx

## 2019-05-30 NOTE — Telephone Encounter (Signed)
Noted  

## 2019-06-08 ENCOUNTER — Other Ambulatory Visit: Payer: Self-pay | Admitting: Family Medicine

## 2019-06-19 ENCOUNTER — Emergency Department (HOSPITAL_COMMUNITY)
Admission: EM | Admit: 2019-06-19 | Discharge: 2019-06-19 | Disposition: A | Discharge status: Home / Self Care | Payer: Medicare Other | Attending: Emergency Medicine | Admitting: Emergency Medicine

## 2019-06-19 ENCOUNTER — Encounter (HOSPITAL_COMMUNITY): Payer: Self-pay | Admitting: Emergency Medicine

## 2019-06-19 ENCOUNTER — Emergency Department (HOSPITAL_COMMUNITY): Payer: Medicare Other

## 2019-06-19 DIAGNOSIS — Z87891 Personal history of nicotine dependence: Secondary | ICD-10-CM | POA: Insufficient documentation

## 2019-06-19 DIAGNOSIS — I700 Atherosclerosis of aorta: Secondary | ICD-10-CM | POA: Diagnosis not present

## 2019-06-19 DIAGNOSIS — I110 Hypertensive heart disease with heart failure: Secondary | ICD-10-CM | POA: Diagnosis not present

## 2019-06-19 DIAGNOSIS — Z7982 Long term (current) use of aspirin: Secondary | ICD-10-CM | POA: Diagnosis not present

## 2019-06-19 DIAGNOSIS — N3000 Acute cystitis without hematuria: Secondary | ICD-10-CM | POA: Diagnosis not present

## 2019-06-19 DIAGNOSIS — E119 Type 2 diabetes mellitus without complications: Secondary | ICD-10-CM | POA: Diagnosis not present

## 2019-06-19 DIAGNOSIS — Z85118 Personal history of other malignant neoplasm of bronchus and lung: Secondary | ICD-10-CM | POA: Diagnosis not present

## 2019-06-19 DIAGNOSIS — I5032 Chronic diastolic (congestive) heart failure: Secondary | ICD-10-CM | POA: Diagnosis not present

## 2019-06-19 DIAGNOSIS — Z794 Long term (current) use of insulin: Secondary | ICD-10-CM | POA: Insufficient documentation

## 2019-06-19 DIAGNOSIS — R10812 Left upper quadrant abdominal tenderness: Secondary | ICD-10-CM | POA: Diagnosis not present

## 2019-06-19 DIAGNOSIS — R1032 Left lower quadrant pain: Secondary | ICD-10-CM

## 2019-06-19 DIAGNOSIS — Z79899 Other long term (current) drug therapy: Secondary | ICD-10-CM | POA: Insufficient documentation

## 2019-06-19 DIAGNOSIS — D649 Anemia, unspecified: Secondary | ICD-10-CM | POA: Insufficient documentation

## 2019-06-19 DIAGNOSIS — J449 Chronic obstructive pulmonary disease, unspecified: Secondary | ICD-10-CM | POA: Diagnosis not present

## 2019-06-19 DIAGNOSIS — K5730 Diverticulosis of large intestine without perforation or abscess without bleeding: Secondary | ICD-10-CM | POA: Diagnosis not present

## 2019-06-19 LAB — COMPREHENSIVE METABOLIC PANEL
ALT: 16 U/L (ref 0–44)
AST: 20 U/L (ref 15–41)
Albumin: 3.3 g/dL — ABNORMAL LOW (ref 3.5–5.0)
Alkaline Phosphatase: 63 U/L (ref 38–126)
Anion gap: 10 (ref 5–15)
BUN: 10 mg/dL (ref 8–23)
CO2: 23 mmol/L (ref 22–32)
Calcium: 8.3 mg/dL — ABNORMAL LOW (ref 8.9–10.3)
Chloride: 105 mmol/L (ref 98–111)
Creatinine, Ser: 0.96 mg/dL (ref 0.44–1.00)
GFR calc Af Amer: >60 mL/min (ref >60)
GFR calc non Af Amer: 60 mL/min — ABNORMAL LOW (ref >60)
Glucose, Bld: 116 mg/dL — ABNORMAL HIGH (ref 70–99)
Potassium: 4.3 mmol/L (ref 3.5–5.1)
Sodium: 138 mmol/L (ref 135–145)
Total Bilirubin: 0.6 mg/dL (ref 0.3–1.2)
Total Protein: 6.4 g/dL — ABNORMAL LOW (ref 6.5–8.1)

## 2019-06-19 LAB — CBC
HCT: 28.6 % — ABNORMAL LOW (ref 36.0–46.0)
Hemoglobin: 8.3 g/dL — ABNORMAL LOW (ref 12.0–15.0)
MCH: 23.4 pg — ABNORMAL LOW (ref 26.0–34.0)
MCHC: 29 g/dL — ABNORMAL LOW (ref 30.0–36.0)
MCV: 80.8 fL (ref 80.0–100.0)
Platelets: 246 10*3/uL (ref 150–400)
RBC: 3.54 MIL/uL — ABNORMAL LOW (ref 3.87–5.11)
RDW: 17.2 % — ABNORMAL HIGH (ref 11.5–15.5)
WBC: 7.6 10*3/uL (ref 4.0–10.5)
nRBC: 0 % (ref 0.0–0.2)

## 2019-06-19 LAB — URINALYSIS, ROUTINE W REFLEX MICROSCOPIC
Bilirubin Urine: NEGATIVE
Glucose, UA: NEGATIVE mg/dL
Ketones, ur: NEGATIVE mg/dL
Nitrite: NEGATIVE
Protein, ur: 30 mg/dL — AB
Specific Gravity, Urine: 1.032 — ABNORMAL HIGH (ref 1.005–1.030)
WBC, UA: >50 WBC/hpf — ABNORMAL HIGH (ref 0–5)
pH: 6 (ref 5.0–8.0)

## 2019-06-19 LAB — POC OCCULT BLOOD, ED: Fecal Occult Bld: POSITIVE — AB

## 2019-06-19 LAB — LIPASE, BLOOD: Lipase: 29 U/L (ref 11–51)

## 2019-06-19 MED ORDER — CEPHALEXIN 500 MG PO CAPS: 500.0000 mg | ORAL_CAPSULE | Freq: Two times a day (BID) | ORAL | 0 refills | Status: DC

## 2019-06-19 MED ORDER — IOHEXOL 300 MG/ML  SOLN
100.0000 mL | Freq: Once | INTRAMUSCULAR | Status: AC | PRN
  Administered 2019-06-19: 16:00:00 100 mL via INTRAVENOUS

## 2019-06-19 MED ORDER — SODIUM CHLORIDE 0.9% FLUSH: 3.0000 mL | Freq: Once | INTRAVENOUS | Status: DC

## 2019-06-19 NOTE — ED Provider Notes (Signed)
Care signed out to me by previous provider. CT is pending at shift change and UA.  CT does not show any acute intraabdominal process. There is some bladder wall thickening and UA shows small hgb, large leuks, rare bacteria, and >50 WBC. Urine culture sent. She denies specific urinary symptoms but is having lower abdominal pain. Will rx Keflex and advised her to have her blood counts rechecked since she has had a drop in her hgb and is hemoccult positive.    Recardo Evangelist, PA-C 06/19/19 1736    Little, Wenda Overland, MD 06/20/19 [phone]

## 2019-06-19 NOTE — ED Triage Notes (Signed)
Pt here with c/o llq abd pain , no nv/ pt does have a history of diverticulosis , states that she has not been eating anything with seeds

## 2019-06-19 NOTE — ED Notes (Signed)
Patient transported to CT 

## 2019-06-19 NOTE — ED Notes (Signed)
Patient verbalizes understanding of discharge instructions. Opportunity for questioning and answers were provided. Pt discharged from ED. 

## 2019-06-19 NOTE — ED Notes (Signed)
Pt changed and purewick applied. Pulled up in bed. Family present in room.

## 2019-06-19 NOTE — ED Provider Notes (Signed)
[facility] EMERGENCY DEPARTMENT Provider Note   CSN: [id number] Arrival date & time: 06/19/19  1138     History   Chief Complaint Chief Complaint  Patient presents with  . Abdominal Pain    HPI [name] is a 73 y.o. female.     73 year old female brought to the emergency room by her grandson with complaint of left side abdominal pain x2 days.  Patient reports falling out of bed 2 days ago, denies feeling weak or dizzy prior to her fall, states she more or less just slipped right out of bed landing on her left side.  Patient is not on blood thinners, does report significant bruising to her left side since her fall.  Patient denies nausea, vomiting, changes in bowel or bladder habits.  No other injuries, complaints or concerns.     Past Medical History:  Diagnosis Date  . Age-related nuclear cataract of both eyes 2016   +cortical age related cataracts OU   . Allergy    seasonal  . Arthritis    hands  . Bilateral diabetic retinopathy (HCC) 2015   Dr. Zigmund Daniel  . Blood transfusion without reported diagnosis    when taking chemo  . Chronic renal insufficiency, stage 3 (moderate) (HCC)    GFR 50s  . 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  Marland Kitchen COPD (chronic obstructive pulmonary disease) ([place])   . CVA (cerebral vascular accident) ([place]) 09/2016   MRI did show a left-sided small ischemic stroke which was acute but likely incidental (MRI was done b/c pt had TIA sx's in hospital).  Neuro put her on plavix at that time.  Cardiology d/c'd her plavix 08/2017.  . Diabetes mellitus with complication (HCC)    diab retinopathy OU (laser)  . GERD (gastroesophageal reflux disease)    protonix  . History of concussion 08/25/2018   w/out loss of consciousness--mild increase in baseline memory impairment after.  CT head neg acute.  Marland Kitchen History of diverticulitis of colon    with abscess; required IR percutaneous drain placed  09/2016.  Marland Kitchen Hyperlipidemia   . Left bundle branch block (LBBB) 12/16/2016  . Lung cancer ([place])    non-small cell lung ca, stage III in 05/2011; systemic chemotherapy concurrent with radiation followed by prophylactic cranial irradiation and has been observation since July of 2010 with no evidence for disease recurrence-released from onc f/u 12/2014 (needs annual cxr by PCP).  CXR 08/2015 stable.  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 Earlie Server did bx and this was NEG for malignancy.  . Mild cognitive impairment with memory loss    Likely from brain radiation therapy  . Non-obstructive CAD (coronary artery disease)    a. Cath 2006 preserved LV fxn, scattered irregularities without critical stenosis; b. 2008 stress echo negative for ischemia, but with hypertensive response; c. 08/2016 Cath: D1 25%, otw nl.  . Normocytic anemia 2018   Iron, vit B12, folate all normal 12/2016.  Marland Kitchen Open-angle glaucoma 2016   Dr. Shirley Muscat, (bilateral)---responding to topical therapy  . Osteoporosis 03/2016; 03/2018   03/2016 DEXA T-score -2.2.  03/2018 T-score L femoral neck  -2.7--boniva started.  . Pneumonia    hospitalization 11/2016; hypoxemic resp failure--d/c'd home with home oxygen therapy  . Subclinical hyperthyroidism 2018  . Takotsubo cardiomyopathy    a. 08/2016 Echo: EF 30-35%, gr1DD, PASP 52mHg;  b. 08/2016 Cath: nonobs Dzs;  c. 11/2016 Echo: EF 40-45%, no rwma, nl RV fxn, mild TR, PASP 326mg.  ECHO 02/2017 EF 50-55%, grd II DD---essentially resolved Takotsubo 02/2017.  . 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 08/2016 in setting of diverticulitis & pneumoperitoneum and Takotsubo CM -->prolonged QT, seen by EP-->avoid meds with potential for QT prolongation.    Patient Active Problem List   Diagnosis Date Noted  . Preoperative cardiovascular examination 01/12/2017  . Diarrhea 12/02/2016  . CAP (community acquired pneumonia) 12/01/2016  . Essential hypertension 12/01/2016  . Chronic diastolic (congestive)  heart failure ([place]) 12/01/2016  . Cerebrovascular accident (CVA) ([place])   . Hypertension associated with chronic kidney disease due to type 2 diabetes mellitus ([place]) 09/17/2016  . Cardiac arrest with ventricular fibrillation ([place]) 09/14/2016  . Takotsubo cardiomyopathy 09/14/2016  . Colonic diverticular abscess   . Diverticulitis of large intestine with perforation and abscess 09/11/2016  . Controlled type 2 diabetes mellitus with hyperglycemia, with long-term current use of insulin ([place]) 06/14/2016  . Hyperlipidemia 06/14/2016  . COPD exacerbation ([place]) 10/18/2013  . Non-occlusive coronary artery disease 06/06/2012  . Allergic rhinitis 01/06/2011  . TOBACCO USE, QUIT 07/29/2009  . Uncontrolled type 2 diabetes mellitus with complication ([place]) 48/18/5631  . GERD (gastroesophageal reflux disease) 04/24/2007    Past Surgical History:  Procedure Laterality Date  . ABDOMINAL HYSTERECTOMY  1997  . APPENDECTOMY    . CARDIAC CATHETERIZATION N/A 09/14/2016   Procedure: Left Heart Cath and Coronary Angiography;  Surgeon: Troy Sine, MD;  Location: [facility];  Service: Cardiovascular; Mild nonobstructive CAD with 85% smooth narrowing in the first diagonal branch of the LAD; 10% smooth narrowing of the ostial proximal left circumflex coronary artery; and a normal dominant RCA.  Marland Kitchen CARPAL TUNNEL RELEASE    . CATARACT EXTRACTION    . CHOLECYSTECTOMY  2002  . COLONOSCOPY  10/24/00   normal.  [employer] hemoccult testing via [place] 06/18/15 was NEG  . dexa  03/2016; 03/2018   03/2018 T-score L femoral neck  -2.7 (worsened compared to osteopenic range 2017.)  . IR GENERIC HISTORICAL  09/19/2016   IR SINUS/FIST TUBE CHK-NON GI 09/19/2016 Corrie Mckusick, DO [facility]  . IR GENERIC HISTORICAL  10/06/2016   IR RADIOLOGIST EVAL & MGMT 10/06/2016 [facility]  . IR GENERIC HISTORICAL  10/26/2016   IR RADIOLOGIST EVAL & MGMT 10/26/2016 [facility]  . IR GENERIC HISTORICAL  11/03/2016   IR  RADIOLOGIST EVAL & MGMT 11/03/2016 Ardis Rowan, PA-C [facility]  . IR GENERIC HISTORICAL  11/24/2016   IR RADIOLOGIST EVAL & MGMT 11/24/2016 Ardis Rowan, PA-C [facility]  . IR GENERIC HISTORICAL  12/05/2016   Fistula smaller/improving.  IR SINUS/FIST TUBE CHK-NON GI 12/05/2016 Sandi Mariscal, MD [facility]  . IR GENERIC HISTORICAL  12/22/2016   IR RADIOLOGIST EVAL & MGMT 12/22/2016 Greggory Keen, MD [facility]  . IR RADIOLOGIST EVAL & MGMT  01/10/2017  . IR RADIOLOGIST EVAL & MGMT  02/09/2017  . IR SINUS/FIST TUBE CHK-NON GI  03/28/2017  . LEFT HEART CATHETERIZATION WITH CORONARY ANGIOGRAM N/A 05/30/2012   Procedure: LEFT HEART CATHETERIZATION WITH CORONARY ANGIOGRAM;  Surgeon: Burnell Blanks, MD;  Location: [facility] CATH LAB;  Service: Cardiovascular: Mild, non-obstructive CAD  . TRANSTHORACIC ECHOCARDIOGRAM  09/14/2016; 11/2016   08/2016: EF 30-35 %, Akinesis of the mid-apicalanteroseptal myocardium.  Grade I DD.  Mild pulm HTN.  Repeat echo 11/2016, EF 40-45%, no rwma, nl RV fxn, mild TR, PASP 65mHg.    .Marland KitchenTRANSTHORACIC ECHOCARDIOGRAM  03/07/2017  EF 50-55%. Hypokinesis of the distal septum with overall low normal LV,  systolic function; mild diastolic dysfunction with elevated LV  filling pressure; mildly calcified aortic valve with mild AI; small pericardial effusion     OB History   No obstetric history on file.      Home Medications    Prior to Admission medications   Medication Sig Start Date End Date Taking? Authorizing Provider  acetaminophen (TYLENOL) 500 MG tablet Take 500-1,000 mg by mouth every 6 (six) hours as needed for headache (or pain).    Yes [provider]  albuterol (PROVENTIL HFA;VENTOLIN HFA) 108 (90 Base) MCG/ACT inhaler INHALE 2 PUFFS BY MOUTH EVERY 6 HOURS AS NEEDED FOR WHEEZING OR SHORTNESS OF BREATH Patient taking differently: Inhale 2 puffs into the lungs every 6 (six) hours as needed for wheezing or shortness of breath.   12/21/18  Yes McGowen, [name], MD  albuterol (PROVENTIL) (2.5 MG/3ML) 0.083% nebulizer solution USE ONE VIAL IN NEBULIZER EVERY 4 HOURS AS NEEDED FOR WHEEZING OR SHORTNESS OF BREATH Patient taking differently: Take 2.5 mg by nebulization every 4 (four) hours as needed for wheezing or shortness of breath.  12/10/18  Yes McGowen, [name], MD  aspirin 81 MG chewable tablet Chew 81 mg by mouth daily.   Yes [provider]  atorvastatin (LIPITOR) 40 MG tablet Take 1 tablet (40 mg total) by mouth daily. 12/13/18  Yes McGowen, [name], MD  buPROPion (WELLBUTRIN XL) 150 MG 24 hr tablet Take 1 tablet (150 mg total) by mouth daily. 12/13/18  Yes McGowen, [name], MD  cetirizine (ZYRTEC) 10 MG tablet Take 10 mg by mouth daily.   Yes [provider]  escitalopram (LEXAPRO) 20 MG tablet Take 1 tablet by mouth once daily 06/10/19  Yes McGowen, [name], MD  FLOVENT [facility] 220 MCG/ACT inhaler Inhale 2 puffs by mouth twice daily 05/18/19  Yes McGowen, [name], MD  furosemide (LASIX) 20 MG tablet Take 1 tablet (20 mg total) by mouth every other day. 09/19/18  Yes Leonie Man, MD  ibandronate (BONIVA) 150 MG tablet Take 1 tablet (150 mg total) by mouth every 30 (thirty) days. Take in the morning with a full glass of water, on an empty stomach, and do not take anything else by mouth or lie down for the next 30 min. 05/06/19  Yes McGowen, [name], MD  Insulin Isophane & Regular Human (HUMULIN 70/30 KWIKPEN) (70-30) 100 UNIT/ML PEN Take a base dose of 9 U SQ at breakfast and 7 U SQ at evening meal + add additional units PER SLIDING SCALE EVERY MORNING AND EVENING 140-199 2UNITS, 200-250 4 UNITS, 251-299 6 UNITS, 300-349 8 UNITS, 350 OR ABOVE 10 UNITS Patient taking differently: Inject 2-10 Units into the skin See admin instructions. Take a base dose of 9 U SQ at breakfast and 7 U SQ at evening meal + add additional units PER SLIDING SCALE EVERY MORNING AND EVENING 140-199 2 UNITS, 200-250 4 UNITS, 251-299  6 UNITS, 300-349 8 UNITS, 350 OR ABOVE 10 UNITS 02/11/19  Yes McGowen, [name], MD  metFORMIN (GLUCOPHAGE) 500 MG tablet TAKE 1 TABLET BY MOUTH TWICE DAILY WITH A MEAL Patient taking differently: Take 500 mg by mouth 2 (two) times daily.  04/29/19  Yes McGowen, [name], MD  metoprolol tartrate (LOPRESSOR) 25 MG tablet TAKE 1 TABLET BY MOUTH IN THE MORNING AND 2 AT BEDTIME Patient taking differently: Take 25-50 mg by mouth See admin instructions. Take one  tablet in the morning, then take 2 tablets in the evening per daughter 04/15/19  Yes Leonie Man, MD  [facility] DELICA LANCETS 16X MISC USE 1  TO CHECK GLUCOSE THREE TIMES DAILY 05/04/18  Yes McGowen, [name], MD  pantoprazole (PROTONIX) 40 MG tablet TAKE 1 TABLET BY MOUTH ONCE DAILY Patient taking differently: Take 40 mg by mouth daily.  08/06/18  Yes McGowen, [name], MD  potassium chloride (K-DUR) 10 MEQ tablet Take 1 tablet by mouth once daily 05/13/19  Yes McGowen, [name], MD  ONE TOUCH ULTRA TEST test strip USE  STRIP TO CHECK GLUCOSE THREE TIMES DAILY 05/31/18   McGowen, [name], MD    Family History Family History  Problem Relation Age of Onset  . Heart failure Mother   . Kidney disease Mother        renal failure  . Diabetes Mother   . Diabetes Father   . Diabetes Brother   . Heart disease Brother   . Heart attack Brother   . Heart attack Brother   . Heart attack Brother   . Colon cancer Neg Hx     Social History Social History   Tobacco Use  . Smoking status: Former Smoker    Packs/day: 1.00    Years: 45.00    Pack years: 45.00    Types: Cigarettes    Quit date: 10/24/2008    Years since quitting: 10.6  . Smokeless tobacco: Never Used  Substance Use Topics  . Alcohol use: No    Alcohol/week: 0.0 standard drinks  . Drug use: No     Allergies   Lactose intolerance (gi) and Ibuprofen   Review of Systems Review of Systems  Constitutional: Negative for fever.  Respiratory: Negative for shortness of breath.    Cardiovascular: Negative for chest pain.  Gastrointestinal: Positive for abdominal pain. Negative for blood in stool, constipation, diarrhea, nausea and vomiting.  Genitourinary: Negative for dysuria, frequency and urgency.  Musculoskeletal: Negative for arthralgias and myalgias.  Skin: Positive for color change. Negative for wound.  Neurological: Negative for dizziness and weakness.  Hematological: Does not bruise/bleed easily.  Psychiatric/Behavioral: Negative for confusion.  All other systems reviewed and are negative.    Physical Exam Updated Vital Signs BP (!) 120/54   Pulse 84   Temp 98.2 F (36.8 C) (Oral)   Resp 16   SpO2 96%   Physical Exam Vitals signs and nursing note reviewed. Exam conducted with a chaperone present.  Constitutional:      General: She is not in acute distress.    Appearance: She is well-developed. She is not diaphoretic.  HENT:     Head: Normocephalic and atraumatic.  Cardiovascular:     Rate and Rhythm: Normal rate and regular rhythm.     Heart sounds: Normal heart sounds.  Pulmonary:     Effort: Pulmonary effort is normal.     Breath sounds: Normal breath sounds.  Abdominal:     Palpations: Abdomen is soft.     Tenderness: There is abdominal tenderness in the left upper quadrant and left lower quadrant.    Genitourinary:    Rectum: Normal. Guaiac result positive. No tenderness.     Comments: Stool is soft and brown. Skin:    General: Skin is warm and dry.  Neurological:     Mental Status: She is alert and oriented to person, place, and time.  Psychiatric:        Behavior: Behavior normal.  ED Treatments / Results  Labs (all labs ordered are listed, but only abnormal results are displayed) Labs Reviewed  COMPREHENSIVE METABOLIC PANEL - Abnormal; Notable for the following components:      Result Value   Glucose, Bld 116 (*)    Calcium 8.3 (*)    Total Protein 6.4 (*)    Albumin 3.3 (*)    GFR calc non Af Amer 60 (*)     All other components within normal limits  CBC - Abnormal; Notable for the following components:   RBC 3.54 (*)    Hemoglobin 8.3 (*)    HCT 28.6 (*)    MCH 23.4 (*)    MCHC 29.0 (*)    RDW 17.2 (*)    All other components within normal limits  POC OCCULT BLOOD, ED - Abnormal; Notable for the following components:   Fecal Occult Bld POSITIVE (*)    All other components within normal limits  LIPASE, BLOOD  URINALYSIS, ROUTINE W REFLEX MICROSCOPIC    EKG None  Radiology No results found.  Procedures Procedures (including critical care time)  Medications Ordered in ED Medications  sodium chloride flush (NS) 0.9 % injection 3 mL (has no administration in time range)  iohexol (OMNIPAQUE) 300 MG/ML solution 100 mL (100 mLs Intravenous Contrast Given 06/19/19 1542)     Initial Impression / Assessment and Plan / ED Course  I have reviewed the triage vital signs and the nursing notes.  Pertinent labs & imaging results that were available during my care of the patient were reviewed by me and considered in my medical decision making (see chart for details).  Clinical Course as of Jun 18 1558  Wed Jun 18, 9533  [id number] 73 year old female brought in by grandson for left-sided abdominal pain x2 days.  Patient slid out of bed 2 days ago landing on her left side resulting in a large bruise to her left low back abdominal area.  Exam has left-sided lateral tenderness.  Hemoccult is positive, stool is soft and brown.  Review of lab work, CBC with anemia with hemoglobin of 8.3, previously 11.1 in November 2018.  Lipase within normal limits, CMP without significant changes.  Discussed with Dr. Louann Sjogren, ER attending, if CT abdomen pelvis does not show anything significant, consider GI decision making the patient regarding her anemia and outpatient versus inpatient work-up CT abdomen and pelvis pending as well as UA.  Signed out to Janetta Hora, PA-C   [LM]    Clinical Course User Index [LM] Roque Lias      Final Clinical Impressions(s) / ED Diagnoses   Final diagnoses:  None    ED Discharge Orders    None       Roque Lias 06/19/19 1559    Virgel Manifold, MD 06/20/19 1134

## 2019-06-19 NOTE — ED Notes (Signed)
Pt states she had a mechanical fall 2 days ago; large bruising to L hip, small abrasion to L buttock; denies hitting head, denies loc

## 2019-06-19 NOTE — ED Notes (Signed)
Pt returned from CT °

## 2019-06-19 NOTE — Discharge Instructions (Signed)
Take Keflex twice a day for 5 days for UTI Please follow up with your doctor about your blood counts Return if worsening

## 2019-06-21 ENCOUNTER — Telehealth: Payer: Self-pay | Admitting: Family Medicine

## 2019-06-21 DIAGNOSIS — D649 Anemia, unspecified: Secondary | ICD-10-CM

## 2019-06-21 LAB — URINE CULTURE: Culture: >=100000 — AB

## 2019-06-21 NOTE — Telephone Encounter (Signed)
Patient's daugther Deloris Royster is calling to schedule a hospital follow up. Please advise [phone]

## 2019-06-23 ENCOUNTER — Telehealth: Payer: Self-pay | Admitting: Emergency Medicine

## 2019-06-23 NOTE — Telephone Encounter (Signed)
Post ED Visit - Positive Culture Follow-up  Culture report reviewed by antimicrobial stewardship pharmacist: [place] Team []  Elenor Quinones, Pharm.D. []  Heide Guile, Pharm.D., BCPS AQ-ID []  Parks Neptune, Pharm.D., BCPS []  Alycia Rossetti, Pharm.D., BCPS []  [place], [place].D., BCPS, AAHIVP []  Legrand Como, Pharm.D., BCPS, AAHIVP []  Salome Arnt, PharmD, BCPS []  Johnnette Gourd, PharmD, BCPS []  Hughes Better, PharmD, BCPS [x]  Elicia Lamp, PharmD []  Laqueta Linden, PharmD, BCPS []  Albertina Parr, PharmD  [place] Team []  Leodis Sias, PharmD []  Lindell Spar, PharmD []  Royetta Asal, PharmD []  Graylin Shiver, Rph []  Rema Fendt) Glennon Mac, PharmD []  Arlyn Dunning, PharmD []  Netta Cedars, PharmD []  Dia Sitter, PharmD []  Leone Haven, PharmD []  Gretta Arab, PharmD []  Theodis Shove, PharmD []  Peggyann Juba, PharmD []  Reuel Boom, PharmD   Positive urine culture Treated with Cephalexin, organism sensitive to the same and no further patient follow-up is required at this time.  [place] 06/23/2019, 10:28 AM

## 2019-06-24 NOTE — Telephone Encounter (Signed)
There is no availability until 9/17 for appt.   Please let me know if there is somewhere you'd like me to work her in?

## 2019-06-25 NOTE — Telephone Encounter (Signed)
Noted  

## 2019-06-25 NOTE — Telephone Encounter (Signed)
Abdominal pain is not as bad. Lab visit scheduled for 06/27/19 before appt.

## 2019-06-25 NOTE — Telephone Encounter (Signed)
(  I reviewed her ED visit info from 06/19/19. Treated for UTI with keflex, urine clx showed sensitivity to cefazolin.  Hb down to 8.3 compared to last measurement of 11.24 Aug 2018.  Hemoccult POS.)  OK to just put her in next available appt slot, but I want her to come by and get labs (CBC and iron testing, NONfasting) at her earliest convenience. Is her abdominal pain any better?

## 2019-06-27 ENCOUNTER — Ambulatory Visit (INDEPENDENT_AMBULATORY_CARE_PROVIDER_SITE_OTHER): Payer: Medicare Other | Admitting: Family Medicine

## 2019-06-27 ENCOUNTER — Other Ambulatory Visit: Payer: Self-pay

## 2019-06-27 ENCOUNTER — Ambulatory Visit (INDEPENDENT_AMBULATORY_CARE_PROVIDER_SITE_OTHER): Payer: Medicare Other

## 2019-06-27 ENCOUNTER — Encounter: Payer: Self-pay | Admitting: Family Medicine

## 2019-06-27 DIAGNOSIS — K922 Gastrointestinal hemorrhage, unspecified: Secondary | ICD-10-CM

## 2019-06-27 DIAGNOSIS — R195 Other fecal abnormalities: Secondary | ICD-10-CM

## 2019-06-27 DIAGNOSIS — S301XXD Contusion of abdominal wall, subsequent encounter: Secondary | ICD-10-CM | POA: Diagnosis not present

## 2019-06-27 DIAGNOSIS — D649 Anemia, unspecified: Secondary | ICD-10-CM | POA: Diagnosis not present

## 2019-06-27 DIAGNOSIS — N3000 Acute cystitis without hematuria: Secondary | ICD-10-CM | POA: Diagnosis not present

## 2019-06-27 IMAGING — CT CT ABD-PELV W/ CM
2 of 5 series · 16 of 46 positions shown, 18 images · IV contrast (omnipaque)
Comparison: Abdominal radiograph dated 08/24/2018 and CT dated
12/02/2016

CLINICAL DATA: 72-year-old female with abdominal pain.

EXAM:
CT ABDOMEN AND PELVIS WITH CONTRAST
TECHNIQUE: Multidetector CT imaging of the abdomen and pelvis was performed
using the standard protocol following bolus administration of
intravenous contrast.
CONTRAST:  100mL OMNIPAQUE IOHEXOL 300 MG/ML  SOLN

[Series 3: a/p w/ 5mm · axial · 0.92mm/px · z∈[+794,+1254]mm · 13 of 104 slices shown, 15 images]
[im 6/104  soft-tissue]
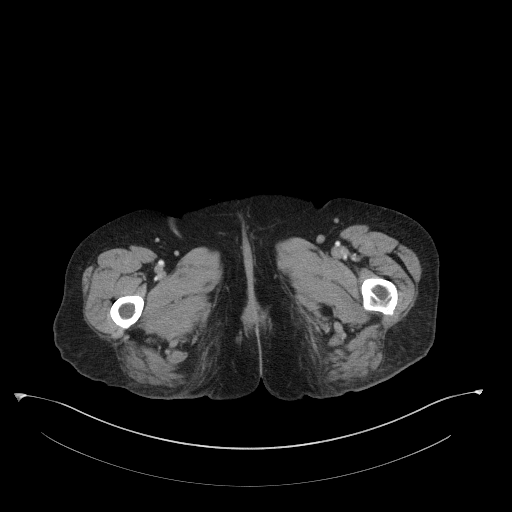
[im 6/104  bone]
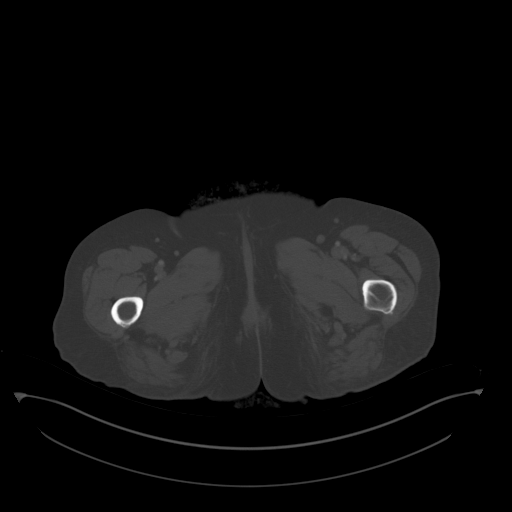
[im 17/104  soft-tissue]
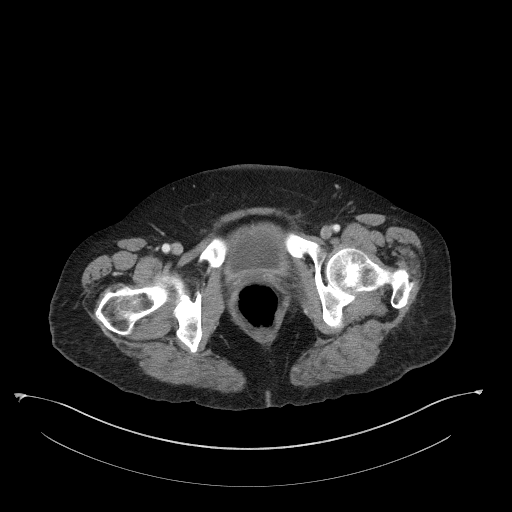
[im 22/104  soft-tissue]
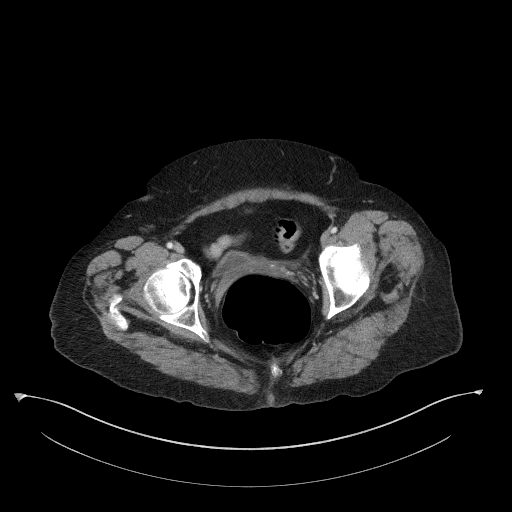
[im 28/104  soft-tissue]
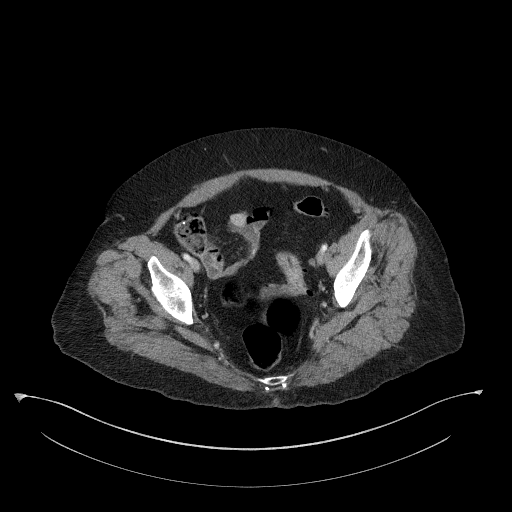
[im 38/104  soft-tissue]
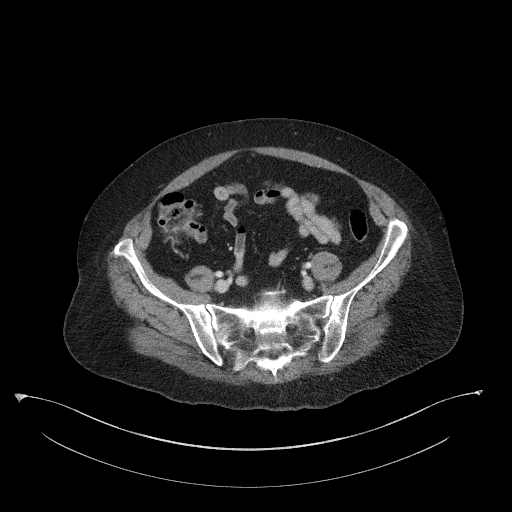
[im 44/104  soft-tissue]
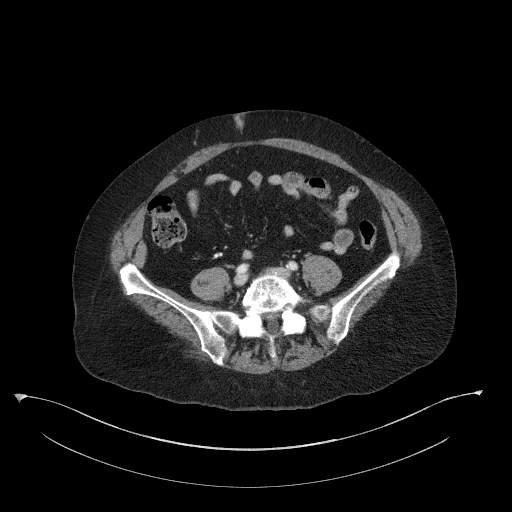
[im 55/104  soft-tissue]
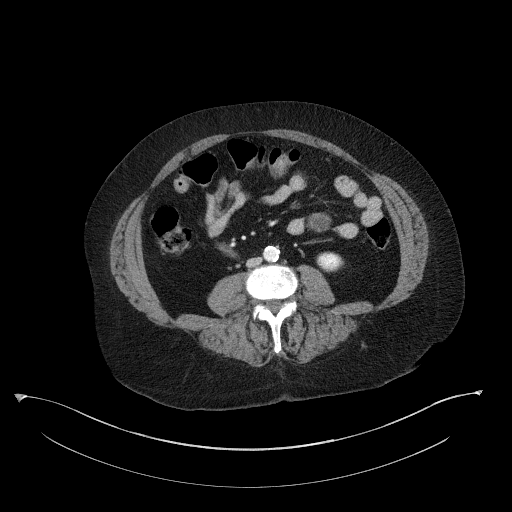
[im 60/104  soft-tissue]
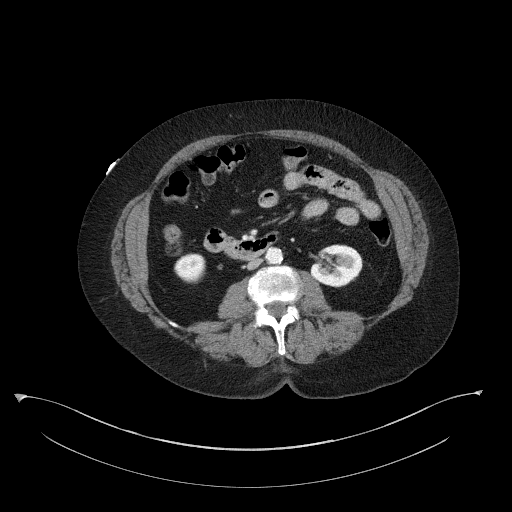
[im 66/104  soft-tissue]
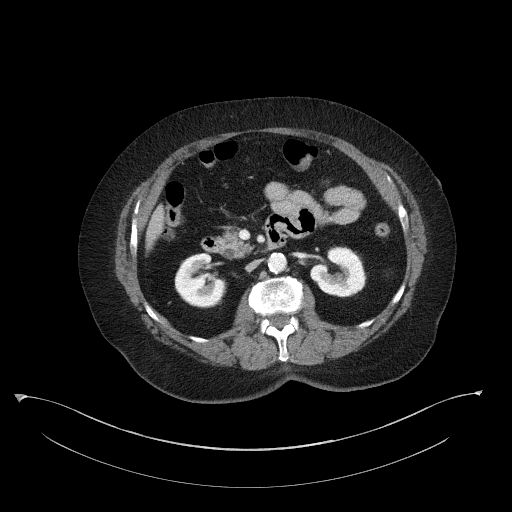
[im 66/104  bone]
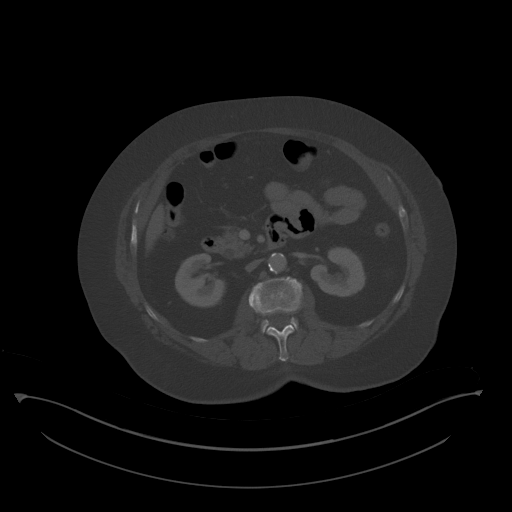
[im 76/104  soft-tissue]
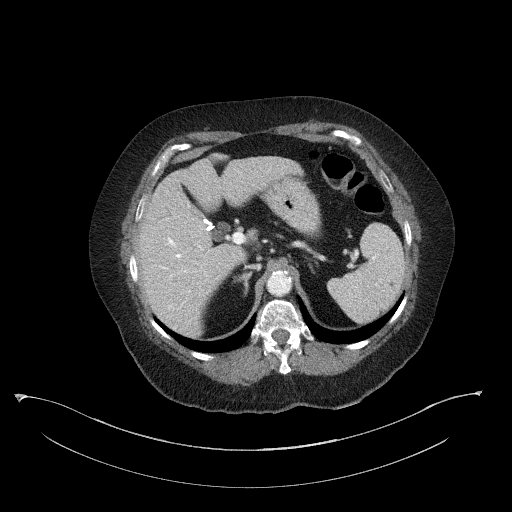
[im 82/104  soft-tissue]
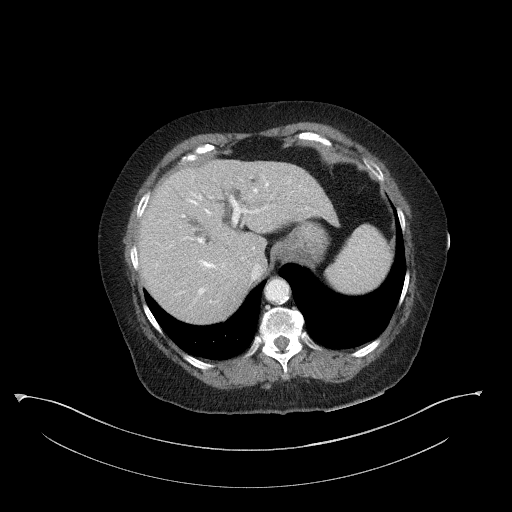
[im 87/104  soft-tissue]
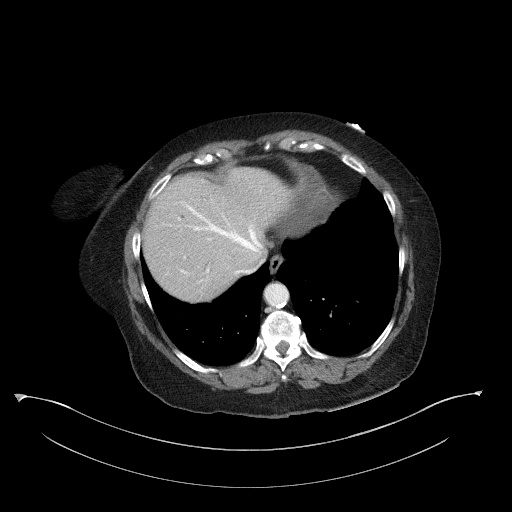
[im 98/104  soft-tissue]
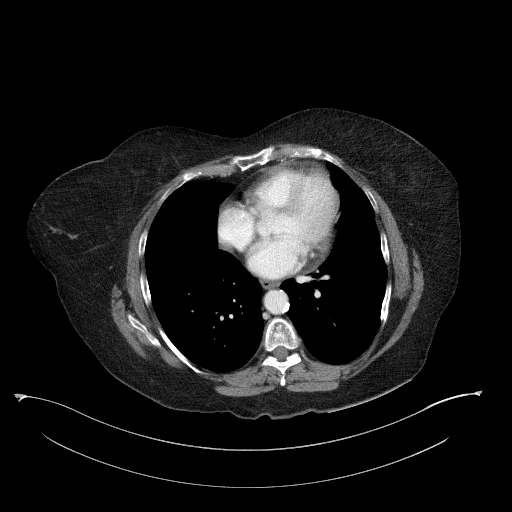

[Series 6: a/p w/ cor · coronal · 0.75mm/px · 3 of 151 slices shown]
[im 51/151  soft-tissue]
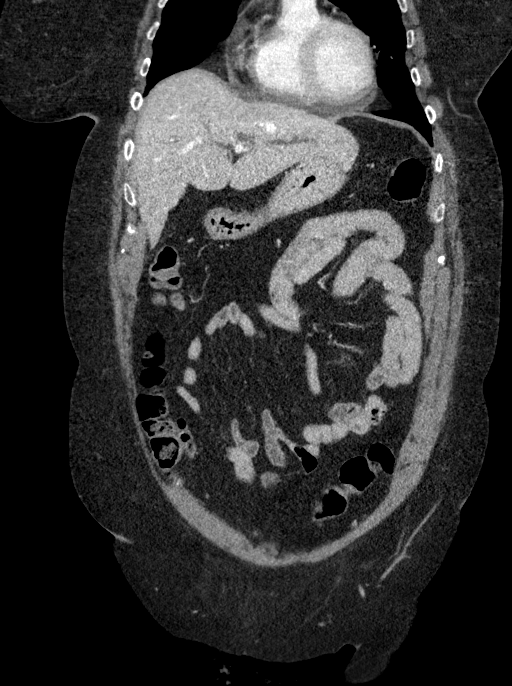
[im 67/151  soft-tissue]
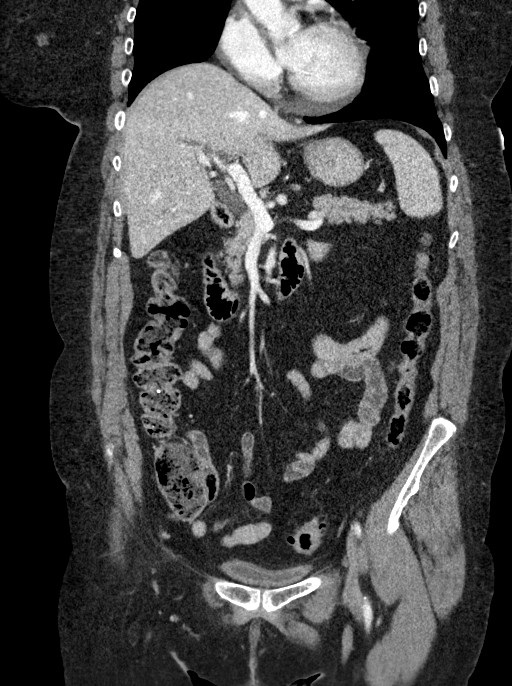
[im 84/151  soft-tissue]
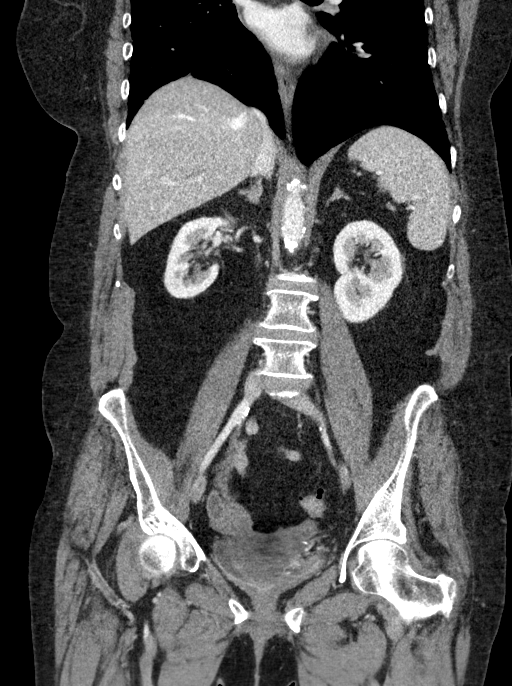

[16 of 46 positions shown; findings below may reference images not displayed]

FINDINGS: Lower chest: Minimal bibasilar subpleural atelectasis/scarring. The
visualized lungs otherwise clear.

No intra-abdominal free air or free fluid.

Hepatobiliary: Cholecystectomy. Mild intrahepatic biliary ductal
dilatation, likely post cholecystectomy changes. No calcified stone
noted in the central CBD.

Pancreas: Unremarkable. No pancreatic ductal dilatation or
surrounding inflammatory changes.

Spleen: Subcentimeter splenic hypodense lesion is too small to
characterize.

Adrenals/Urinary Tract: The adrenal glands, kidneys, and visualized
ureters appear unremarkable. Mild mucosal enhancement of the bladder
noted. Clinical correlation is recommended to exclude cystitis.

Stomach/Bowel: There is sigmoid diverticulosis without active
inflammatory changes. No bowel obstruction or active inflammation.
Normal appendix.

Vascular/Lymphatic: Moderate aortoiliac atherosclerotic disease. The
IVC is unremarkable. No portal venous gas. There is no adenopathy.

Reproductive: Hysterectomy. No pelvic mass.

Other: None

Musculoskeletal: Osteopenia. No acute osseous pathology.
IMPRESSION: 1. Mild mucosal enhancement of the bladder. Clinical correlation is
recommended to exclude cystitis.
2. Sigmoid diverticulosis. No bowel obstruction or active
inflammation. Normal appendix.

## 2019-06-27 IMAGING — CR DG ABDOMEN ACUTE W/ 1V CHEST
3 series · 3 of 3 positions shown · non-contrast
Comparison: Chest x-ray 06/10/2017

CLINICAL DATA: Chest and abdominal pain couple hours.

EXAM:
DG ABDOMEN ACUTE W/ 1V CHEST

[chest pa]
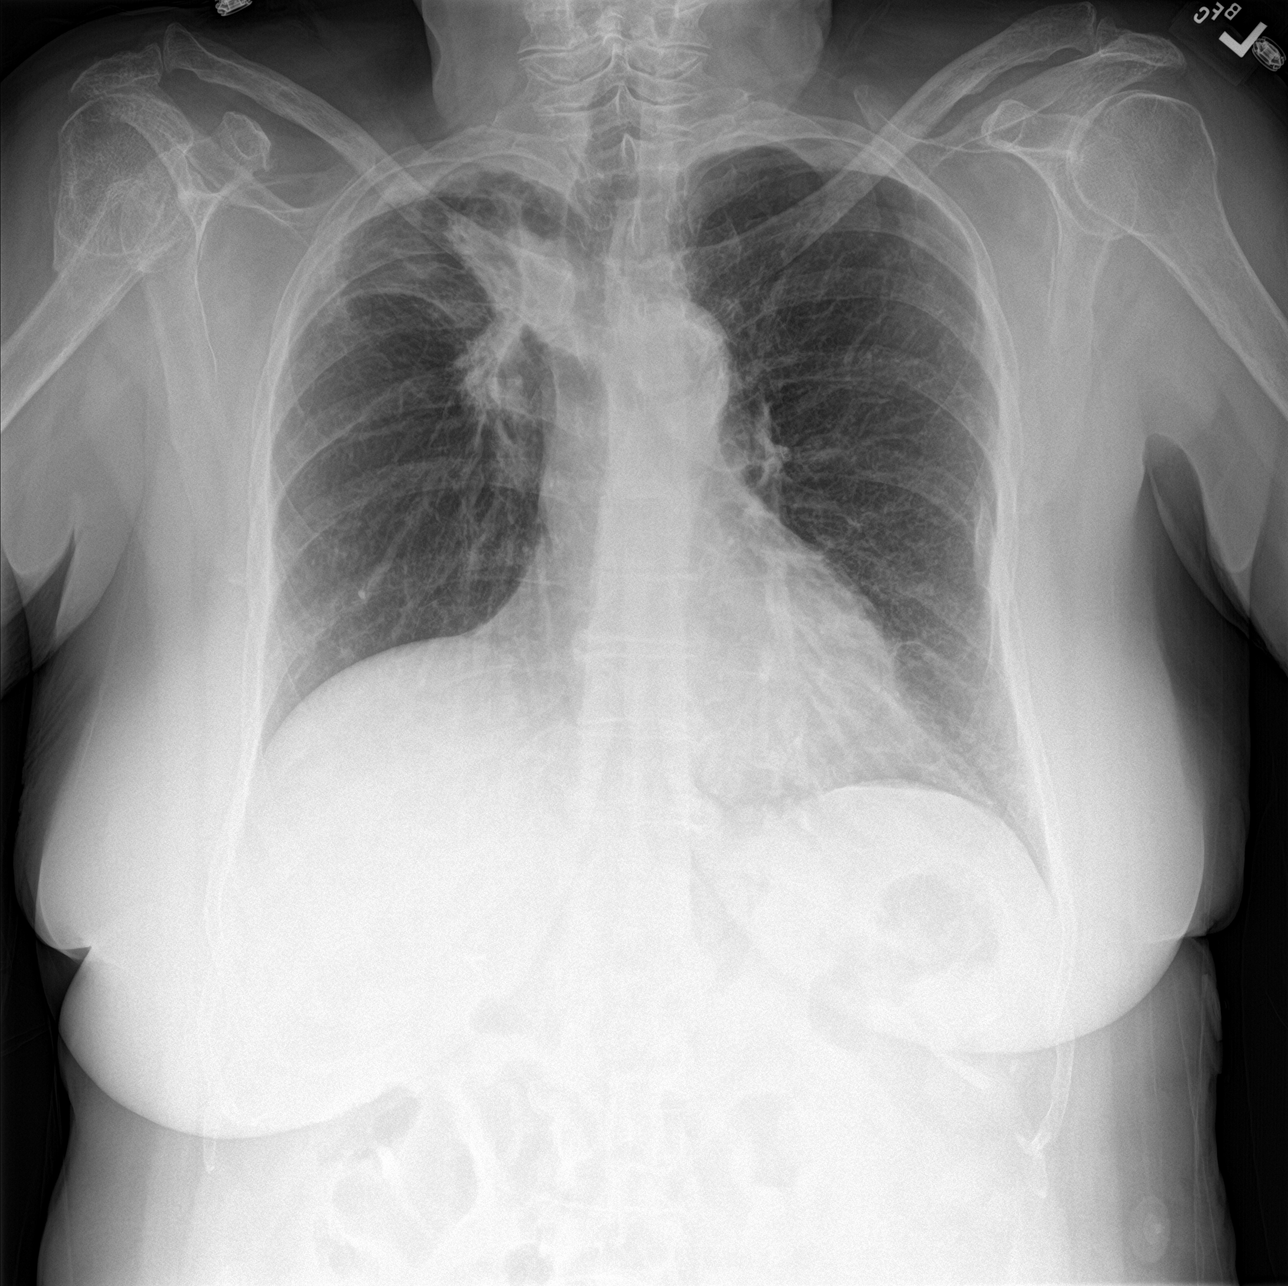

[abdomen erect]
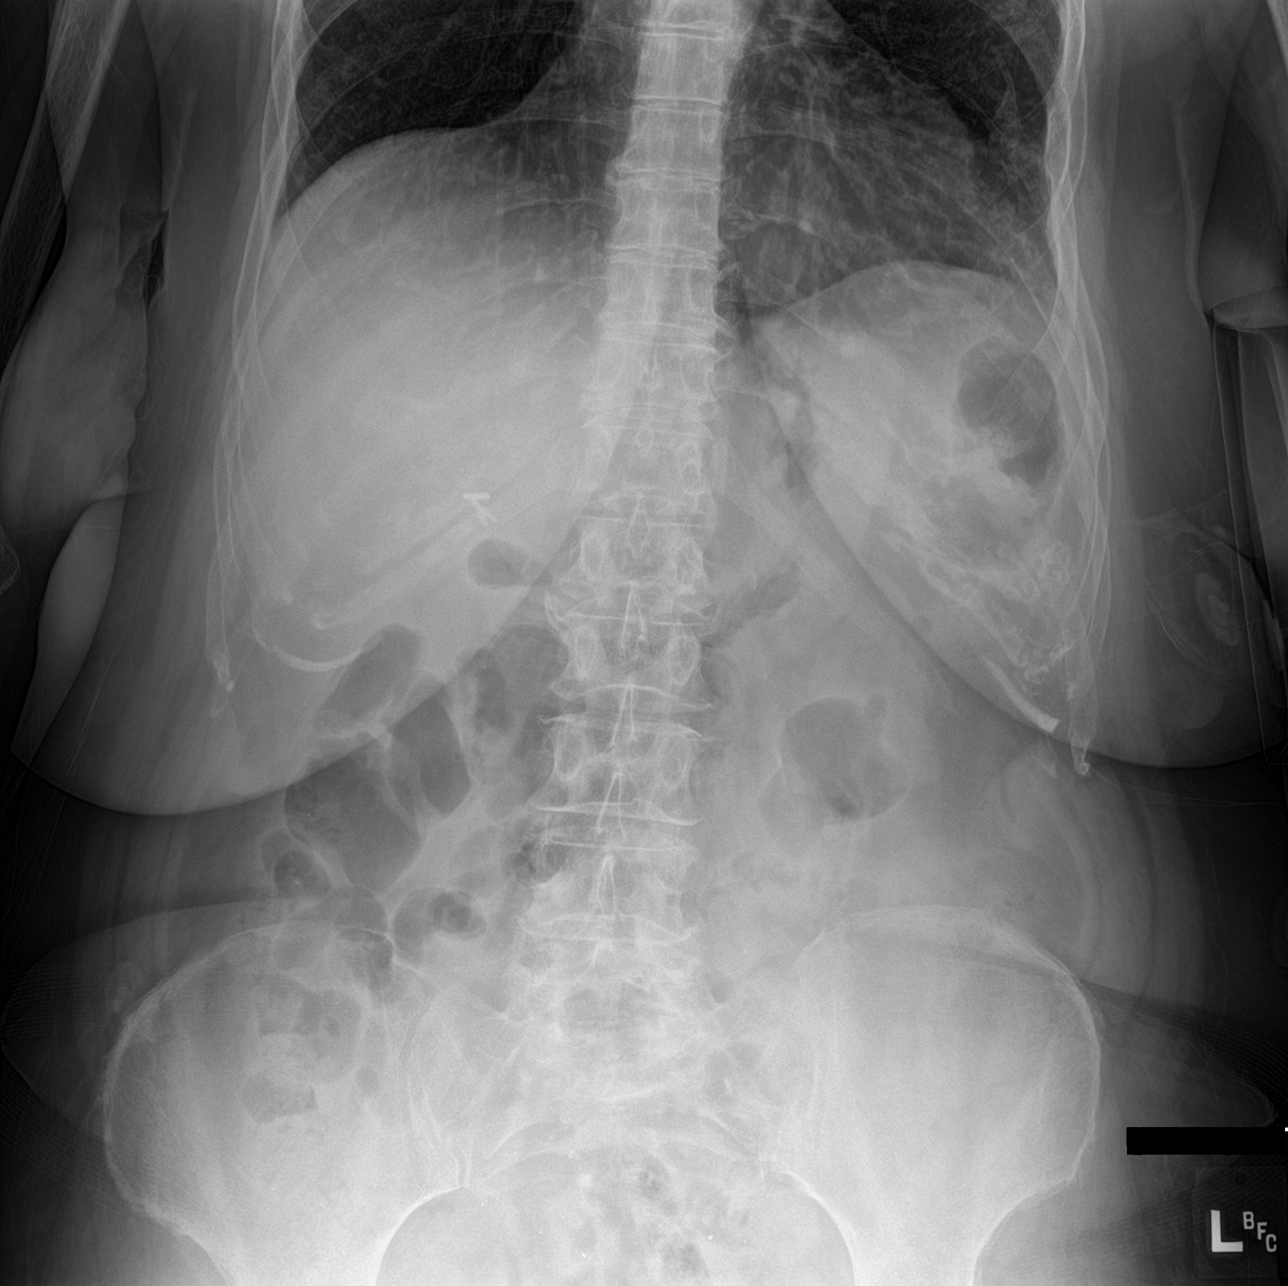

[abdomen supine]
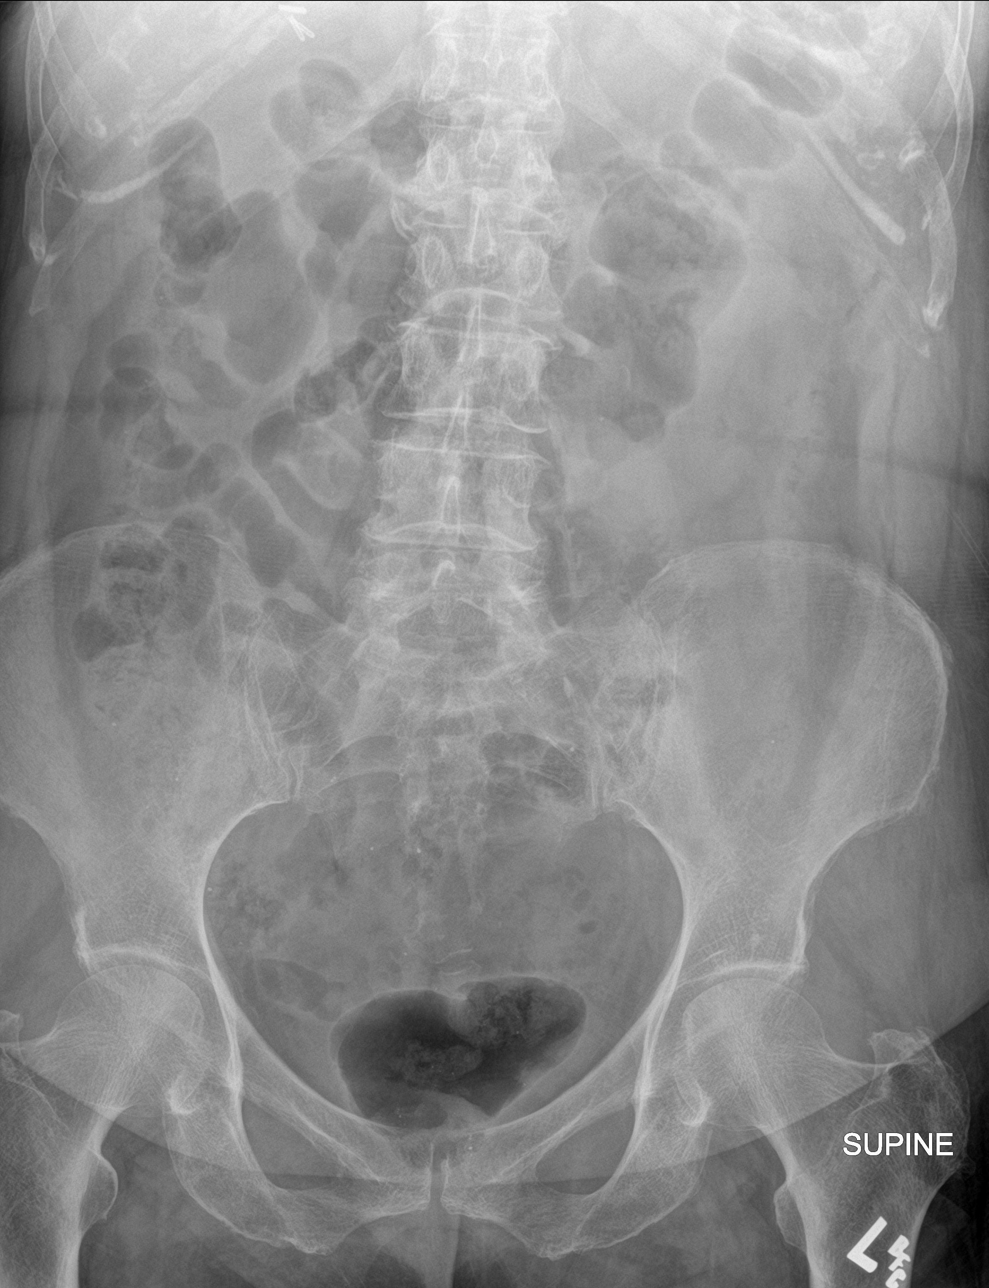

[3 of 3 positions shown; findings below may reference images not displayed]

FINDINGS: Chest x-ray demonstrates volume loss of the right lung with moderate
irregular density over the medial right apex unchanged.
Cardiomediastinal silhouette and remainder of the chest is
unchanged.

Abdominopelvic images demonstrate nonobstructive bowel gas pattern.
No free peritoneal air. Degenerative change of the spine and hips.
IMPRESSION: Nonobstructive bowel gas pattern.

No acute cardiopulmonary disease.

Chronic stable changes of the medial right apex.

## 2019-06-27 NOTE — Progress Notes (Signed)
Virtual Visit via Video Note  I connected with pt on 06/27/19 at  4:00 PM EDT by a video enabled telemedicine application and verified that I am speaking with the correct person using two identifiers.  Location patient: home Location provider:work or home office Persons participating in the virtual visit: patient, pt's grandson, and myself.  I discussed the limitations of evaluation and management by telemedicine and the availability of in person appointments. The patient expressed understanding and agreed to proceed.  Telemedicine visit is a necessity given the COVID-19 restrictions in place at the current time.  HPI: 73 y/o WF being seen today for f/u of recent ED visit. On 06/19/19 pt presented to ED for left sided abd pain of 2d duration.  She had fallen out of bed (slipped), landed on L side right before onset of the pain. CT showed mild circumferential bladder wall thickening and UA was abnl so she was treated for UTI with keflex.  Urine clx grew e coli sensitive to 1st gen cephalosporin, but resistant to multiple abx.   Additionally, her Hb was down to 8.3, MCV 81 (compared to last measurements of Hb 11.1 and MCV 94 in Nov 2019) and she was hemoccult positive.  Has hx of diverticulosis/itis but no known hx of GIB. Takes PPI qd.  She avoids NSAIDs.  Takes ASA 81 mg qd for nonobstructive CAD and hx of CVA. No iron supplement being taken. I ordered CBC and iron testing 06/25/19 but these have not been done yet.  Interim hx: No more pain in abd or side.  She does think the pain from her fall, not a UTI. She denies having any urinary complaints leading up to the ED visit.  Denies fevers or nausea. Denies any change in her breathing-"I think I breath fine".  No fatigue.  She ambulates with a walker as per her usual.  Denies melena, hematochezia, or urine in blood. She is not taking any OTC or rx NSAIDs.  Denies epigastric pain.  No unusual wt loss.  Appetite is normal.  ROS: See pertinent  positives and negatives per HPI.  Past Medical History:  Diagnosis Date  . Age-related nuclear cataract of both eyes 2016   +cortical age related cataracts OU   . Allergy    seasonal  . Arthritis    hands  . Bilateral diabetic retinopathy (HCC) 2015   Dr. Zigmund Daniel  . Blood transfusion without reported diagnosis    when taking chemo  . Chronic renal insufficiency, stage 3 (moderate) (HCC)    GFR 50s  . 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  Marland Kitchen COPD (chronic obstructive pulmonary disease) ([place])   . CVA (cerebral vascular accident) ([place]) 09/2016   MRI did show a left-sided small ischemic stroke which was acute but likely incidental (MRI was done b/c pt had TIA sx's in hospital).  Neuro put her on plavix at that time.  Cardiology d/c'd her plavix 08/2017.  . Diabetes mellitus with complication (HCC)    diab retinopathy OU (laser)  . GERD (gastroesophageal reflux disease)    protonix  . History of concussion 08/25/2018   w/out loss of consciousness--mild increase in baseline memory impairment after.  CT head neg acute.  Marland Kitchen History of diverticulitis of colon    with abscess; required IR percutaneous drain placed 09/2016.  Marland Kitchen Hyperlipidemia   . Left bundle branch block (LBBB) 12/16/2016  . Lung cancer ([place])    non-small cell lung ca, stage III in 05/2011;  systemic chemotherapy concurrent with radiation followed by prophylactic cranial irradiation and has been observation since July of 2010 with no evidence for disease recurrence-released from onc f/u 12/2014 (needs annual cxr by PCP).  CXR 08/2015 stable.  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 Earlie Server did bx and this was NEG for malignancy.  . Mild cognitive impairment with memory loss    Likely from brain radiation therapy  . Non-obstructive CAD (coronary artery disease)    a. Cath 2006 preserved LV fxn, scattered irregularities without critical stenosis; b. 2008 stress echo negative for ischemia, but  with hypertensive response; c. 08/2016 Cath: D1 25%, otw nl.  . Normocytic anemia 2018   Iron, vit B12, folate all normal 12/2016.  Marland Kitchen Open-angle glaucoma 2016   Dr. Shirley Muscat, (bilateral)---responding to topical therapy  . Osteoporosis 03/2016; 03/2018   03/2016 DEXA T-score -2.2.  03/2018 T-score L femoral neck  -2.7--boniva started.  . Pneumonia    hospitalization 11/2016; hypoxemic resp failure--d/c'd home with home oxygen therapy  . Subclinical hyperthyroidism 2018  . Takotsubo cardiomyopathy    a. 08/2016 Echo: EF 30-35%, gr1DD, PASP 67mHg;  b. 08/2016 Cath: nonobs Dzs;  c. 11/2016 Echo: EF 40-45%, no rwma, nl RV fxn, mild TR, PASP 390mg.  ECHO 02/2017 EF 50-55%, grd II DD---essentially resolved Takotsubo 02/2017.  . 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 08/2016 in setting of diverticulitis & pneumoperitoneum and Takotsubo CM -->prolonged QT, seen by EP-->avoid meds with potential for QT prolongation.    Past Surgical History:  Procedure Laterality Date  . ABDOMINAL HYSTERECTOMY  1997  . APPENDECTOMY    . CARDIAC CATHETERIZATION N/A 09/14/2016   Procedure: Left Heart Cath and Coronary Angiography;  Surgeon: ThTroy SineMD;  [facility];  Service: Cardiovascular; Mild nonobstructive CAD with 85% smooth narrowing in the first diagonal branch of the LAD; 10% smooth narrowing of the ostial proximal left circumflex coronary artery; and a normal dominant RCA.  . Marland KitchenARPAL TUNNEL RELEASE    . CATARACT EXTRACTION    . CHOLECYSTECTOMY  2002  . COLONOSCOPY  10/24/00   normal.  [facility] hemoccult testing via [place]/25/16 was NEG  . dexa  03/2016; 03/2018   03/2018 T-score L femoral neck  -2.7 (worsened compared to osteopenic range 2017.)  . IR GENERIC HISTORICAL  09/19/2016   IR SINUS/FIST TUBE CHK-NON GI 09/19/2016 JaCorrie MckusickDO [facility]  . IR GENERIC HISTORICAL  10/06/2016   IR RADIOLOGIST EVAL & MGMT 10/06/2016 [facility]  . IR GENERIC HISTORICAL  10/26/2016   IR  RADIOLOGIST EVAL & MGMT 10/26/2016 [facility]  . IR GENERIC HISTORICAL  11/03/2016   IR RADIOLOGIST EVAL & MGMT 11/03/2016 WeArdis RowanPA-C [facility]  . IR GENERIC HISTORICAL  11/24/2016   IR RADIOLOGIST EVAL & MGMT 11/24/2016 WeArdis RowanPA-C [facility]  . IR GENERIC HISTORICAL  12/05/2016   Fistula smaller/improving.  IR SINUS/FIST TUBE CHK-NON GI 12/05/2016 JoSandi MariscalMD [facility]  . IR GENERIC HISTORICAL  12/22/2016   IR RADIOLOGIST EVAL & MGMT 12/22/2016 MiGreggory KeenMD [facility]  . IR RADIOLOGIST EVAL & MGMT  01/10/2017  . IR RADIOLOGIST EVAL & MGMT  02/09/2017  . IR SINUS/FIST TUBE CHK-NON GI  03/28/2017  . LEFT HEART CATHETERIZATION WITH CORONARY ANGIOGRAM N/A 05/30/2012   Procedure: LEFT HEART CATHETERIZATION WITH CORONARY ANGIOGRAM;  Surgeon: ChBurnell BlanksMD;  [facility];  Service: Cardiovascular: Mild,  non-obstructive CAD  . TRANSTHORACIC ECHOCARDIOGRAM  09/14/2016; 11/2016   08/2016: EF 30-35 %, Akinesis of the mid-apicalanteroseptal myocardium.  Grade I DD.  Mild pulm HTN.  Repeat echo 11/2016, EF 40-45%, no rwma, nl RV fxn, mild TR, PASP 63mHg.    .Marland KitchenTRANSTHORACIC ECHOCARDIOGRAM  03/07/2017   EF 50-55%. Hypokinesis of the distal septum with overall low normal LV,  systolic function; mild diastolic dysfunction with elevated LV  filling pressure; mildly calcified aortic valve with mild AI; small pericardial effusion    Family History  Problem Relation Age of Onset  . Heart failure Mother   . Kidney disease Mother        renal failure  . Diabetes Mother   . Diabetes Father   . Diabetes Brother   . Heart disease Brother   . Heart attack Brother   . Heart attack Brother   . Heart attack Brother   . Colon cancer Neg Hx       Current Outpatient Medications:  .  acetaminophen (TYLENOL) 500 MG tablet, Take 500-1,000 mg by mouth every 6 (six) hours as needed for headache (or pain). , Disp: , Rfl:  .  albuterol  (PROVENTIL HFA;VENTOLIN HFA) 108 (90 Base) MCG/ACT inhaler, INHALE 2 PUFFS BY MOUTH EVERY 6 HOURS AS NEEDED FOR WHEEZING OR SHORTNESS OF BREATH (Patient taking differently: Inhale 2 puffs into the lungs every 6 (six) hours as needed for wheezing or shortness of breath. ), Disp: 18 g, Rfl: 0 .  albuterol (PROVENTIL) (2.5 MG/3ML) 0.083% nebulizer solution, USE ONE VIAL IN NEBULIZER EVERY 4 HOURS AS NEEDED FOR WHEEZING OR SHORTNESS OF BREATH (Patient taking differently: Take 2.5 mg by nebulization every 4 (four) hours as needed for wheezing or shortness of breath. ), Disp: 75 mL, Rfl: 3 .  aspirin 81 MG chewable tablet, Chew 81 mg by mouth daily., Disp: , Rfl:  .  atorvastatin (LIPITOR) 40 MG tablet, Take 1 tablet (40 mg total) by mouth daily., Disp: 90 tablet, Rfl: 3 .  buPROPion (WELLBUTRIN XL) 150 MG 24 hr tablet, Take 1 tablet (150 mg total) by mouth daily., Disp: 30 tablet, Rfl: 6 .  cephALEXin (KEFLEX) 500 MG capsule, Take 1 capsule (500 mg total) by mouth 2 (two) times daily., Disp: 10 capsule, Rfl: 0 .  cetirizine (ZYRTEC) 10 MG tablet, Take 10 mg by mouth daily., Disp: , Rfl:  .  escitalopram (LEXAPRO) 20 MG tablet, Take 1 tablet by mouth once daily, Disp: 90 tablet, Rfl: 1 .  FLOVENT HFA 220 MCG/ACT inhaler, Inhale 2 puffs by mouth twice daily, Disp: 12 g, Rfl: 0 .  furosemide (LASIX) 20 MG tablet, Take 1 tablet (20 mg total) by mouth every other day., Disp: 90 tablet, Rfl: 1 .  Insulin Isophane & Regular Human (HUMULIN 70/30 KWIKPEN) (70-30) 100 UNIT/ML PEN, Take a base dose of 9 U SQ at breakfast and 7 U SQ at evening meal + add additional units PER SLIDING SCALE EVERY MORNING AND EVENING 140-199 2UNITS, 200-250 4 UNITS, 251-299 6 UNITS, 300-349 8 UNITS, 350 OR ABOVE 10 UNITS (Patient taking differently: Inject 2-10 Units into the skin See admin instructions. Take a base dose of 9 U SQ at breakfast and 7 U SQ at evening meal + add additional units PER SLIDING SCALE EVERY MORNING AND EVENING  140-199 2 UNITS, 200-250 4 UNITS, 251-299 6 UNITS, 300-349 8 UNITS, 350 OR ABOVE 10 UNITS), Disp: 15 pen, Rfl: 1 .  metFORMIN (GLUCOPHAGE) 500 MG tablet, TAKE 1  TABLET BY MOUTH TWICE DAILY WITH A MEAL (Patient taking differently: Take 500 mg by mouth 2 (two) times daily. ), Disp: 180 tablet, Rfl: 0 .  metoprolol tartrate (LOPRESSOR) 25 MG tablet, TAKE 1 TABLET BY MOUTH IN THE MORNING AND 2 AT BEDTIME (Patient taking differently: Take 25-50 mg by mouth See admin instructions. Take one tablet in the morning, then take 2 tablets in the evening per daughter), Disp: 270 tablet, Rfl: 1 .  ONE TOUCH ULTRA TEST test strip, USE  STRIP TO CHECK GLUCOSE THREE TIMES DAILY, Disp: 100 each, Rfl: 11 .  ONETOUCH DELICA LANCETS 68E MISC, USE 1  TO CHECK GLUCOSE THREE TIMES DAILY, Disp: 100 each, Rfl: 11 .  pantoprazole (PROTONIX) 40 MG tablet, TAKE 1 TABLET BY MOUTH ONCE DAILY (Patient taking differently: Take 40 mg by mouth daily. ), Disp: 90 tablet, Rfl: 3 .  potassium chloride (K-DUR) 10 MEQ tablet, Take 1 tablet by mouth once daily, Disp: 90 tablet, Rfl: 0 .  ibandronate (BONIVA) 150 MG tablet, Take 1 tablet (150 mg total) by mouth every 30 (thirty) days. Take in the morning with a full glass of water, on an empty stomach, and do not take anything else by mouth or lie down for the next 30 min. (Patient not taking: Reported on 06/27/2019), Disp: 3 tablet, Rfl: 3  EXAM:  VITALS per patient if applicable: There were no vitals taken for this visit.   GENERAL: alert, oriented, appears well and in no acute distress  HEENT: atraumatic, conjunttiva clear, no obvious abnormalities on inspection of external nose and ears  NECK: normal movements of the head and neck  LUNGS: on inspection no signs of respiratory distress, breathing rate appears normal, no obvious gross SOB, gasping or wheezing  CV: no obvious cyanosis  MS: moves all visible extremities without noticeable abnormality  PSYCH/NEURO: pleasant and  cooperative, no obvious depression or anxiety, speech and thought processing grossly intact  LABS: none today    Chemistry      Component Value Date/Time   NA 138 06/19/2019 1230   NA 141 09/19/2018 0918   NA 141 01/15/2015 0848   K 4.3 06/19/2019 1230   K 4.0 01/15/2015 0848   CL 105 06/19/2019 1230   CL 105 02/18/2013 0812   CO2 23 06/19/2019 1230   CO2 25 01/15/2015 0848   BUN 10 06/19/2019 1230   BUN 16 09/19/2018 0918   BUN 9.0 01/15/2015 0848   CREATININE 0.96 06/19/2019 1230   CREATININE 1.01 (H) 08/10/2018 1611   CREATININE 0.7 01/15/2015 0848      Component Value Date/Time   CALCIUM 8.3 (L) 06/19/2019 1230   CALCIUM 9.2 01/15/2015 0848   ALKPHOS 63 06/19/2019 1230   ALKPHOS 92 01/15/2015 0848   AST 20 06/19/2019 1230   AST 19 01/15/2015 0848   ALT 16 06/19/2019 1230   ALT 20 01/15/2015 0848   BILITOT 0.6 06/19/2019 1230   BILITOT 0.3 09/19/2018 0918   BILITOT 0.34 01/15/2015 0848     Lab Results  Component Value Date   WBC 7.6 06/19/2019   HGB 8.3 (L) 06/19/2019   HCT 28.6 (L) 06/19/2019   MCV 80.8 06/19/2019   PLT 246 06/19/2019   Hemoccult + in ED 06/19/19  Lab Results  Component Value Date   IRON 49 12/27/2016   IRON 41 (L) 12/27/2016   TIBC 209 (L) 12/27/2016   FERRITIN 171.6 12/27/2016   ASSESSMENT AND PLAN:  Discussed the following assessment and plan:  1) Abd pain: resolved.  Suspect abd wall pain from a fall out of her bed.  2) UTI: ?asymptomatic at the time of dx?Marland Kitchen  Finished abx. No urinary complaints.  3) Anemia and heme+ stool: suspect slow GI bleeding. She feels well. Currently awaiting results of a recheck of CBC.  Also got iron labs today-pending. If Hb stable then will leave her on ASA 87m qd for now.  If drop of 0.5 g since ED visit then I'll stop her ASA. Will refer to GI. (Normal colonoscopy 2002.  IFOB neg 05/2015. She does not have gastroenterologist noted in [facility].   I discussed the assessment and treatment  plan with the patient. The patient was provided an opportunity to ask questions and all were answered. The patient agreed with the plan and demonstrated an understanding of the instructions.   The patient was advised to call back or seek an in-person evaluation if the symptoms worsen or if the condition fails to improve as anticipated.  F/u: to be determined  Signed:  PCrissie Sickles MD           06/27/2019

## 2019-06-28 LAB — CBC WITH DIFFERENTIAL/PLATELET
Basophils Absolute: 0.2 10*3/uL — ABNORMAL HIGH (ref 0.0–0.1)
Basophils Relative: 2.2 % (ref 0.0–3.0)
Eosinophils Absolute: 0.2 10*3/uL (ref 0.0–0.7)
Eosinophils Relative: 1.7 % (ref 0.0–5.0)
HCT: 29.7 % — ABNORMAL LOW (ref 36.0–46.0)
Hemoglobin: 9 g/dL — ABNORMAL LOW (ref 12.0–15.0)
Lymphocytes Relative: 19.4 % (ref 12.0–46.0)
Lymphs Abs: 1.8 10*3/uL (ref 0.7–4.0)
MCHC: 30.4 g/dL (ref 30.0–36.0)
MCV: 77.1 fl — ABNORMAL LOW (ref 78.0–100.0)
Monocytes Absolute: 1 10*3/uL (ref 0.1–1.0)
Monocytes Relative: 10.3 % (ref 3.0–12.0)
Neutro Abs: 6.2 10*3/uL (ref 1.4–7.7)
Neutrophils Relative %: 66.4 % (ref 43.0–77.0)
Platelets: 324 10*3/uL (ref 150.0–400.0)
RBC: 3.86 Mil/uL — ABNORMAL LOW (ref 3.87–5.11)
RDW: 19.5 % — ABNORMAL HIGH (ref 11.5–15.5)
WBC: 9.4 10*3/uL (ref 4.0–10.5)

## 2019-06-28 LAB — IRON,TIBC AND FERRITIN PANEL
%SAT: 5 % (calc) — ABNORMAL LOW (ref 16–45)
Ferritin: 13 ng/mL — ABNORMAL LOW (ref 16–288)
Iron: 17 ug/dL — ABNORMAL LOW (ref 45–160)
TIBC: 353 mcg/dL (calc) (ref 250–450)

## 2019-06-28 IMAGING — CT CT HEAD W/O CM
4 series · 16 of 47 positions shown, 18 images · non-contrast
Comparison: CT head 06/10/2017

CLINICAL DATA: Fall today.  On blood thinner.

EXAM:
CT HEAD WITHOUT CONTRAST
TECHNIQUE: Contiguous axial images were obtained from the base of the skull
through the vertex without intravenous contrast.

[Series 3: head wo · axial · 0.38mm/px · z∈[-83,+27]mm · 7 of 30 slices shown, 9 images]
[im 4/30  brain]
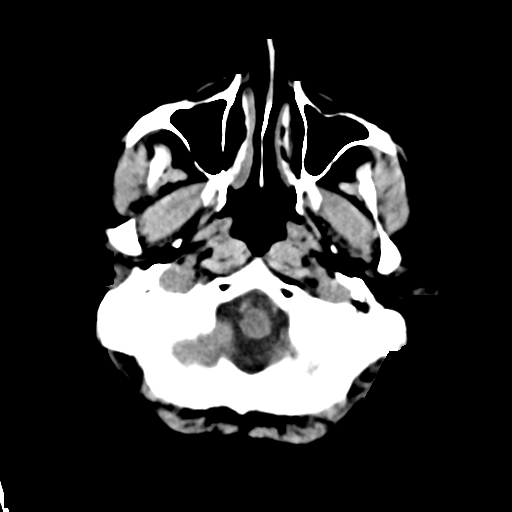
[im 4/30  bone]
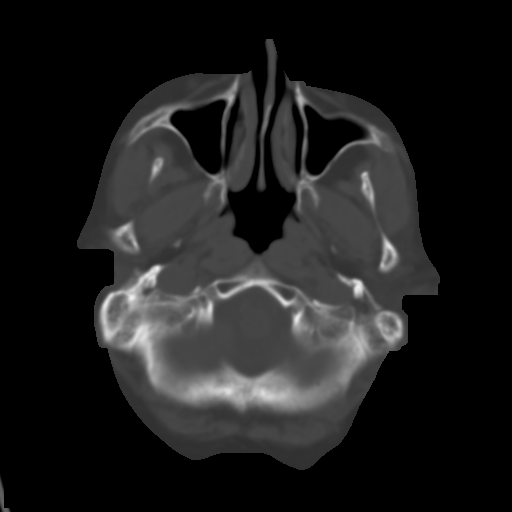
[im 8/30  brain]
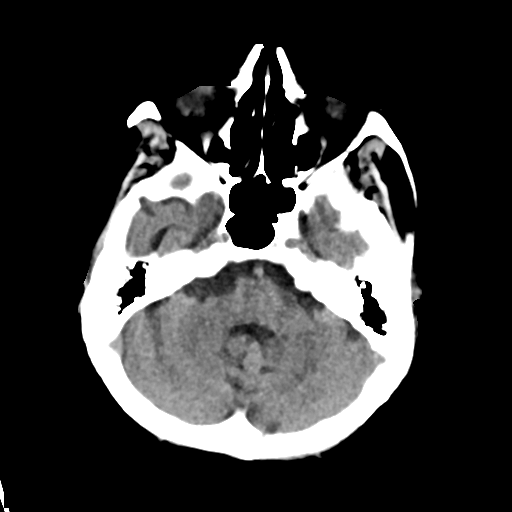
[im 11/30  brain]
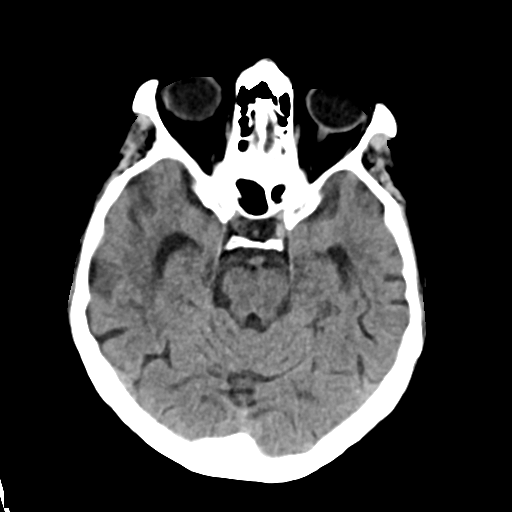
[im 15/30  brain]
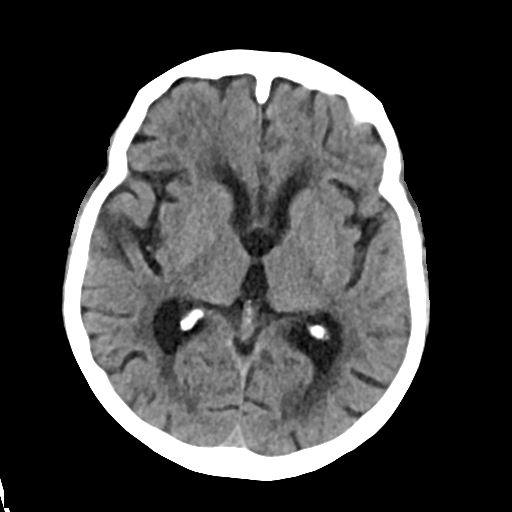
[im 19/30  brain]
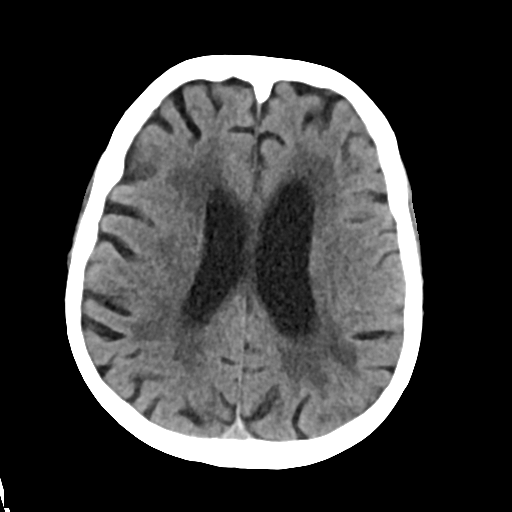
[im 19/30  bone]
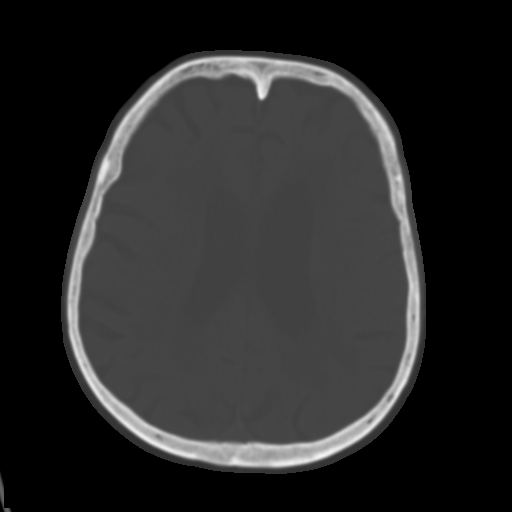
[im 22/30  brain]
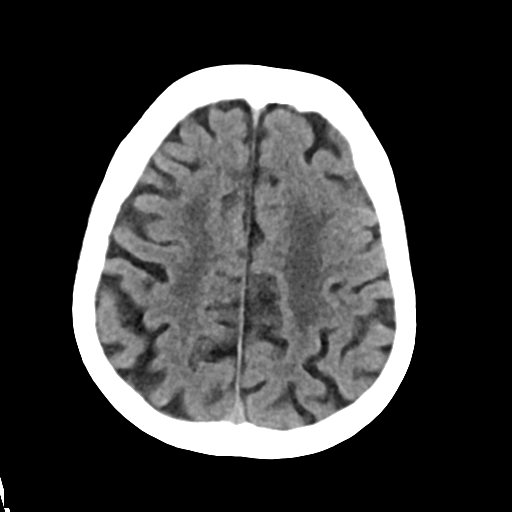
[im 26/30  brain]
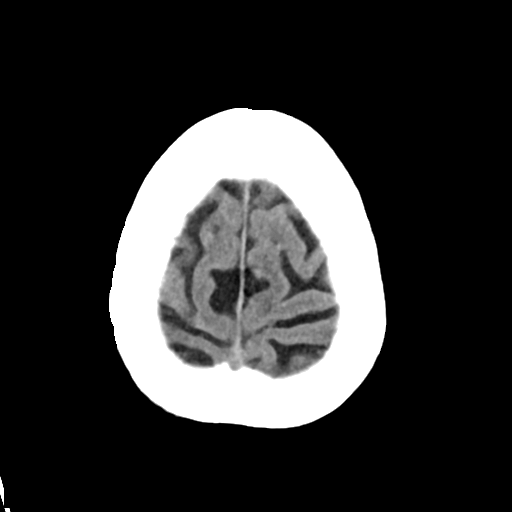

[Series 4: head bone · axial · 0.38mm/px · z∈[-84,-56]mm · 3 of 74 slices shown]
[im 8/74  bone]
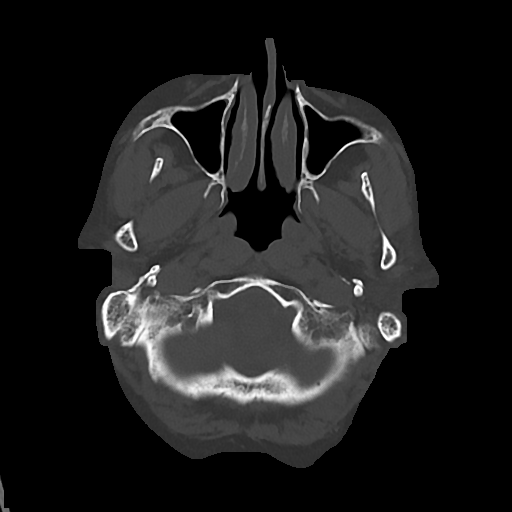
[im 15/74  bone]
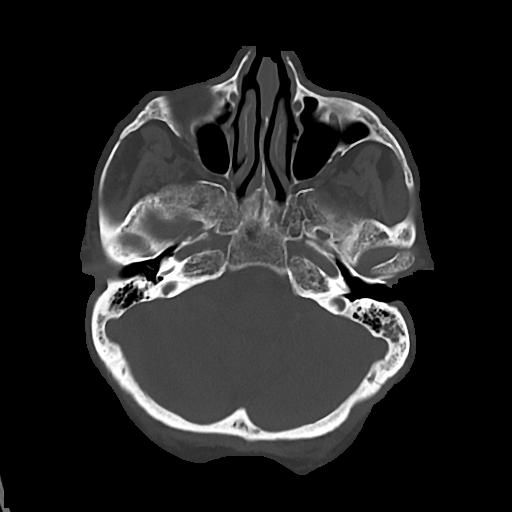
[im 22/74  bone]
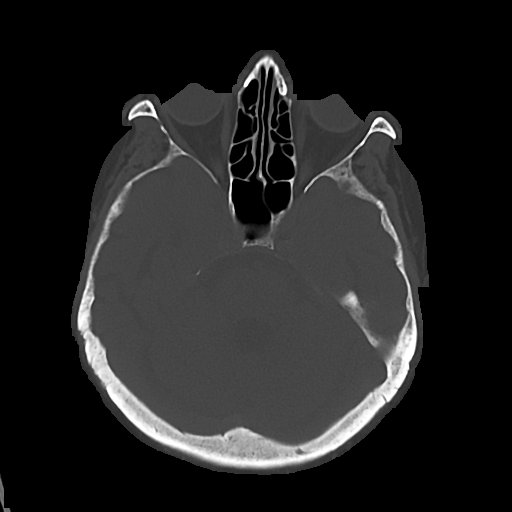

[Series 5: cor soft · coronal · 0.32mm/px · 3 of 57 slices shown]
[im 19/57  brain]
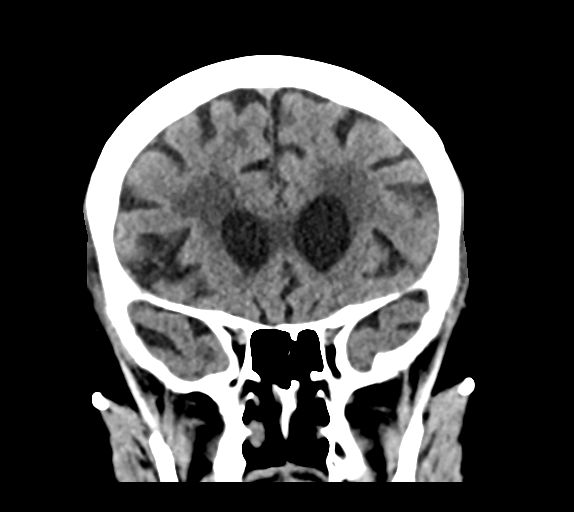
[im 25/57  brain]
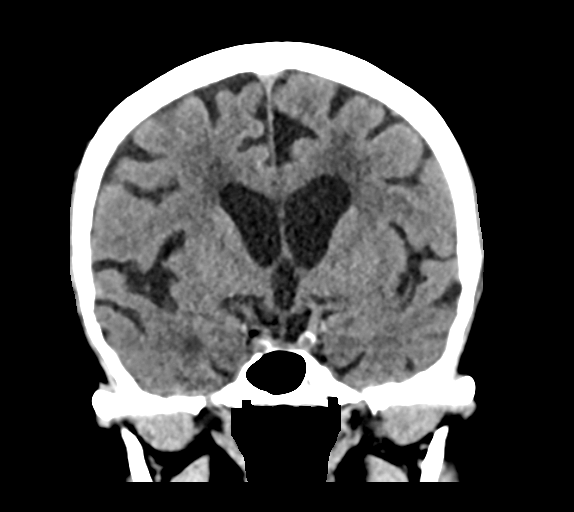
[im 32/57  brain]
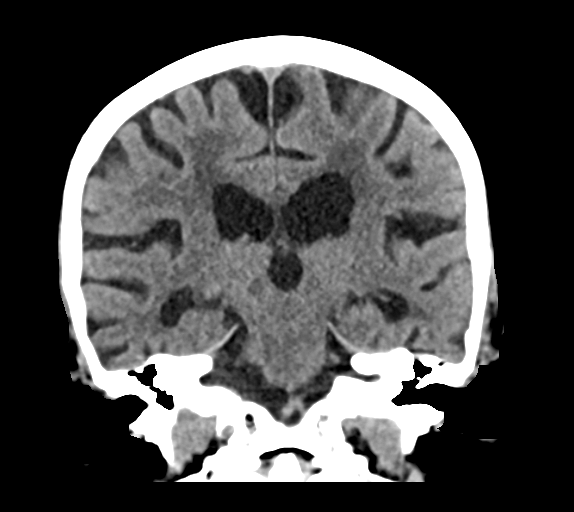

[Series 6: sag soft · sagittal · 0.33mm/px · 3 of 49 slices shown]
[im 17/49  brain]
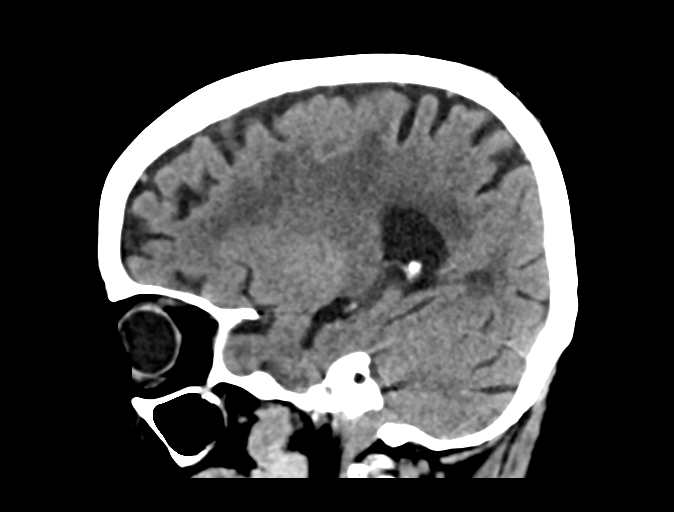
[im 25/49  brain]
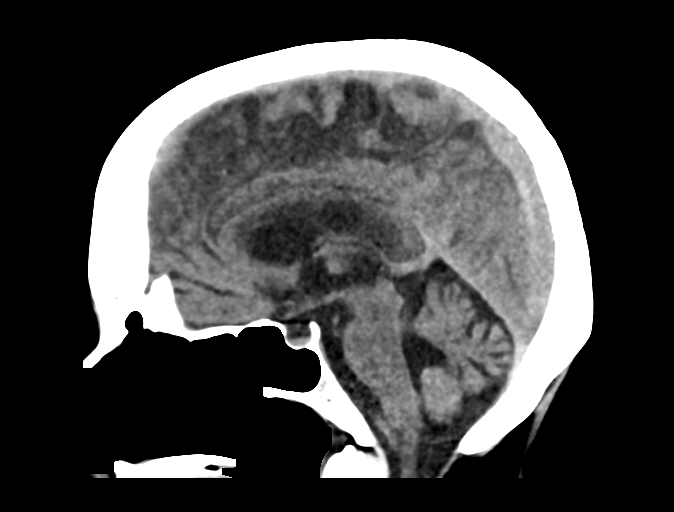
[im 33/49  brain]
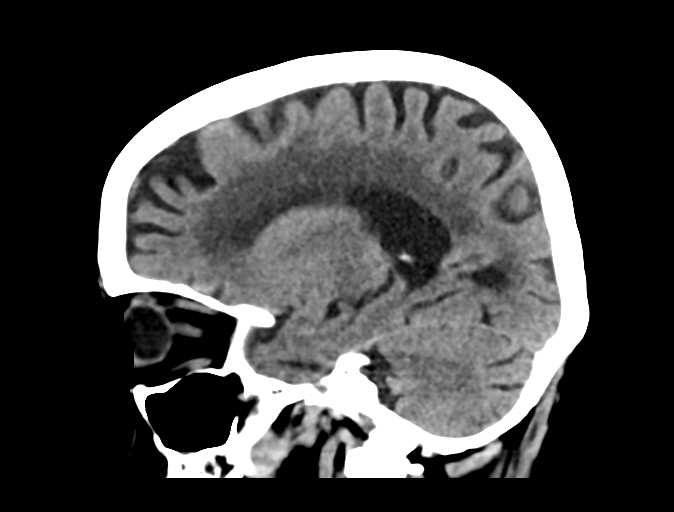

[16 of 47 positions shown; findings below may reference images not displayed]

FINDINGS: Brain: Moderate atrophy and moderate chronic microvascular ischemic
change. No acute abnormality and no change from the prior study.
Negative for acute infarct, hemorrhage, mass. No midline shift.

Vascular: Negative for hyperdense vessel

Skull: Negative

Sinuses/Orbits: Paranasal sinuses clear. Bilateral cataract surgery.

Other: None
IMPRESSION: No acute intracranial abnormality. Moderate atrophy and moderate
chronic microvascular ischemic change in the white matter.

## 2019-07-02 ENCOUNTER — Encounter: Payer: Self-pay | Admitting: Family Medicine

## 2019-07-07 ENCOUNTER — Other Ambulatory Visit: Payer: Self-pay | Admitting: Family Medicine

## 2019-07-08 ENCOUNTER — Other Ambulatory Visit: Payer: Self-pay | Admitting: Family Medicine

## 2019-07-10 ENCOUNTER — Other Ambulatory Visit: Payer: Self-pay | Admitting: Family Medicine

## 2019-07-15 ENCOUNTER — Other Ambulatory Visit: Payer: Self-pay

## 2019-07-15 DIAGNOSIS — Z794 Long term (current) use of insulin: Secondary | ICD-10-CM

## 2019-07-15 DIAGNOSIS — E118 Type 2 diabetes mellitus with unspecified complications: Secondary | ICD-10-CM

## 2019-07-15 MED ORDER — HUMULIN 70/30 KWIKPEN (70-30) 100 UNIT/ML ~~LOC~~ SUPN: PEN_INJECTOR | SUBCUTANEOUS | 1 refills | Status: DC

## 2019-07-17 ENCOUNTER — Other Ambulatory Visit: Payer: Self-pay

## 2019-07-17 ENCOUNTER — Encounter: Payer: Self-pay | Admitting: Physician Assistant

## 2019-07-17 ENCOUNTER — Ambulatory Visit (INDEPENDENT_AMBULATORY_CARE_PROVIDER_SITE_OTHER): Payer: Medicare Other | Admitting: Physician Assistant

## 2019-07-17 ENCOUNTER — Other Ambulatory Visit (INDEPENDENT_AMBULATORY_CARE_PROVIDER_SITE_OTHER): Payer: Medicare Other

## 2019-07-17 ENCOUNTER — Ambulatory Visit: Payer: Self-pay | Admitting: *Deleted

## 2019-07-17 VITALS — BP 98/48 | HR 88 | Temp 96.9°F | Ht 62.0 in | Wt 150.6 lb

## 2019-07-17 DIAGNOSIS — R634 Abnormal weight loss: Secondary | ICD-10-CM | POA: Diagnosis not present

## 2019-07-17 DIAGNOSIS — R5383 Other fatigue: Secondary | ICD-10-CM

## 2019-07-17 DIAGNOSIS — R195 Other fecal abnormalities: Secondary | ICD-10-CM

## 2019-07-17 DIAGNOSIS — D509 Iron deficiency anemia, unspecified: Secondary | ICD-10-CM

## 2019-07-17 LAB — CBC WITH DIFFERENTIAL/PLATELET
Basophils Absolute: 0 10*3/uL (ref 0.0–0.1)
Basophils Relative: 0.5 % (ref 0.0–3.0)
Eosinophils Absolute: 0.1 10*3/uL (ref 0.0–0.7)
Eosinophils Relative: 1.3 % (ref 0.0–5.0)
HCT: 29.5 % — ABNORMAL LOW (ref 36.0–46.0)
Hemoglobin: 9.2 g/dL — ABNORMAL LOW (ref 12.0–15.0)
Lymphocytes Relative: 22 % (ref 12.0–46.0)
Lymphs Abs: 1.6 10*3/uL (ref 0.7–4.0)
MCHC: 31.2 g/dL (ref 30.0–36.0)
MCV: 74.2 fl — ABNORMAL LOW (ref 78.0–100.0)
Monocytes Absolute: 0.7 10*3/uL (ref 0.1–1.0)
Monocytes Relative: 10.1 % (ref 3.0–12.0)
Neutro Abs: 4.7 10*3/uL (ref 1.4–7.7)
Neutrophils Relative %: 66.1 % (ref 43.0–77.0)
Platelets: 288 10*3/uL (ref 150.0–400.0)
RBC: 3.98 Mil/uL (ref 3.87–5.11)
RDW: 20 % — ABNORMAL HIGH (ref 11.5–15.5)
WBC: 7.1 10*3/uL (ref 4.0–10.5)

## 2019-07-17 MED ORDER — PEG 3350-KCL-NA BICARB-NACL 420 G PO SOLR: 4000.0000 mL | Freq: Once | ORAL | 0 refills | Status: AC

## 2019-07-17 NOTE — Progress Notes (Signed)
I agree with the above note, plan 

## 2019-07-17 NOTE — Progress Notes (Signed)
Subjective:    Patient ID: [name], female    DOB: [birth year], 73 y.o.   MRN: [id number]  HPI [name] is a pleasant 73 year old white female, known to Dr. Ardis Hughs from prior colonoscopy done in 2016 who is referred today by Dr. Julien Nordmann for evaluation of iron deficiency anemia and Hemoccult positive stool. Patient had recent labs done 06/19/2019 with hemoglobin 8.3/hematocrit 28.6/MCV of 80, serum iron 17/TIBC 353/iron sat 5/ferritin 13. Labs were repeated on 06/28/2019 hemoglobin of 9. Reviewing her labs in November 2019 hemoglobin was 11.1. She last had colonoscopy in 2016 with 2 small sessile polyps removed which were tubular adenomas and noted to have mild diverticulosis.  She was indicated for 5-year interval follow-up. Patient is very pleasant but a poor historian.  She does admit to having a significant decrease in appetite and says she is just not hungry.  Her granddaughter says she has lost about 20 pounds over the past several months.  She has vague complaints of mid abdominal pain which has been intermittent, no nausea or vomiting, no melena or hematochezia and says her bowel movements have been normal.  She denies any heartburn indigestion or dysphasia.  She does take a baby aspirin daily no NSAIDs. He does admit to fatigue over the past few months and some shortness of breath with exertion. She has been started on ferrous sulfate 325 twice daily and has been on Protonix 40 mg p.o. daily.  Review of Systems Pertinent positive and negative review of systems were noted in the above HPI section.  All other review of systems was otherwise negative.  Outpatient Encounter Medications as of 07/17/2019  Medication Sig  . acetaminophen (TYLENOL) 500 MG tablet Take 500-1,000 mg by mouth every 6 (six) hours as needed for headache (or pain).   Marland Kitchen albuterol (PROVENTIL HFA;VENTOLIN HFA) 108 (90 Base) MCG/ACT inhaler INHALE 2 PUFFS BY MOUTH EVERY 6 HOURS AS NEEDED FOR WHEEZING OR  SHORTNESS OF BREATH (Patient taking differently: Inhale 2 puffs into the lungs every 6 (six) hours as needed for wheezing or shortness of breath. )  . albuterol (PROVENTIL) (2.5 MG/3ML) 0.083% nebulizer solution USE 1 VIAL IN NEBULIZER EVERY 4 HOURS AS NEEDED FOR WHEEZING OR SHORTNESS OF BREATH  . aspirin 81 MG chewable tablet Chew 81 mg by mouth daily.  Marland Kitchen atorvastatin (LIPITOR) 40 MG tablet Take 1 tablet (40 mg total) by mouth daily.  Marland Kitchen buPROPion (WELLBUTRIN XL) 150 MG 24 hr tablet Take 1 tablet by mouth once daily  . cetirizine (ZYRTEC) 10 MG tablet Take 10 mg by mouth daily.  Marland Kitchen escitalopram (LEXAPRO) 20 MG tablet Take 1 tablet by mouth once daily  . ferrous sulfate 325 (65 FE) MG EC tablet Take 325 mg by mouth 2 (two) times daily.  Marland Kitchen FLOVENT HFA 220 MCG/ACT inhaler Inhale 2 puffs by mouth twice daily  . furosemide (LASIX) 20 MG tablet Take 1 tablet (20 mg total) by mouth every other day.  . ibandronate (BONIVA) 150 MG tablet Take 1 tablet (150 mg total) by mouth every 30 (thirty) days. Take in the morning with a full glass of water, on an empty stomach, and do not take anything else by mouth or lie down for the next 30 min.  . Insulin Isophane & Regular Human (HUMULIN 70/30 KWIKPEN) (70-30) 100 UNIT/ML PEN Take a base dose of 9 U SQ at breakfast and 7 U SQ at evening meal + add additional units PER SLIDING SCALE EVERY MORNING AND EVENING 140-199  2UNITS, 200-250 4 UNITS, 251-299 6 UNITS, 300-349 8 UNITS, 350 OR ABOVE 10 UNITS  . metFORMIN (GLUCOPHAGE) 500 MG tablet TAKE 1 TABLET BY MOUTH TWICE DAILY WITH A MEAL (Patient taking differently: Take 500 mg by mouth 2 (two) times daily. )  . metoprolol tartrate (LOPRESSOR) 25 MG tablet TAKE 1 TABLET BY MOUTH IN THE MORNING AND 2 AT BEDTIME (Patient taking differently: Take 25-50 mg by mouth See admin instructions. Take one tablet in the morning, then take 2 tablets in the evening per daughter)  . ONE TOUCH ULTRA TEST test strip USE  STRIP TO CHECK GLUCOSE  THREE TIMES DAILY  . ONETOUCH DELICA LANCETS 36R MISC USE 1  TO CHECK GLUCOSE THREE TIMES DAILY  . pantoprazole (PROTONIX) 40 MG tablet TAKE 1 TABLET BY MOUTH ONCE DAILY (Patient taking differently: Take 40 mg by mouth daily. )  . potassium chloride (K-DUR) 10 MEQ tablet Take 1 tablet by mouth once daily  . polyethylene glycol-electrolytes (NULYTELY/GOLYTELY) 420 g solution Take 4,000 mLs by mouth once for 1 dose.  . [DISCONTINUED] cephALEXin (KEFLEX) 500 MG capsule Take 1 capsule (500 mg total) by mouth 2 (two) times daily.   No facility-administered encounter medications on file as of 07/17/2019.    Allergies  Allergen Reactions  . Lactose Intolerance (Gi) Diarrhea  . Ibuprofen Other (See Comments)    REACTION: Anxiousness, hyperventilates   Patient Active Problem List   Diagnosis Date Noted  . Preoperative cardiovascular examination 01/12/2017  . Diarrhea 12/02/2016  . CAP (community acquired pneumonia) 12/01/2016  . Essential hypertension 12/01/2016  . Chronic diastolic (congestive) heart failure ([place]) 12/01/2016  . Cerebrovascular accident (CVA) ([place])   . Hypertension associated with chronic kidney disease due to type 2 diabetes mellitus ([place]) 09/17/2016  . Cardiac arrest with ventricular fibrillation ([place]) 09/14/2016  . Takotsubo cardiomyopathy 09/14/2016  . Colonic diverticular abscess   . Diverticulitis of large intestine with perforation and abscess 09/11/2016  . Controlled type 2 diabetes mellitus with hyperglycemia, with long-term current use of insulin ([place]) 06/14/2016  . Hyperlipidemia 06/14/2016  . COPD exacerbation ([place]) 10/18/2013  . Non-occlusive coronary artery disease 06/06/2012  . Allergic rhinitis 01/06/2011  . TOBACCO USE, QUIT 07/29/2009  . Uncontrolled type 2 diabetes mellitus with complication ([place]) 44/31/5400  . GERD (gastroesophageal reflux disease) 04/24/2007   Social History   Socioeconomic History  . Marital status: Married    Spouse name: Not on  file  . Number of children: Not on file  . Years of education: Not on file  . Highest education level: Not on file  Occupational History  . Occupation: Retired Public affairs consultant: UNEMPLOYED  Social Needs  . Financial resource strain: Not on file  . Food insecurity    Worry: Not on file    Inability: Not on file  . Transportation needs    Medical: Not on file    Non-medical: Not on file  Tobacco Use  . Smoking status: Former Smoker    Packs/day: 1.00    Years: 45.00    Pack years: 45.00    Types: Cigarettes    Quit date: 10/24/2008    Years since quitting: 10.7  . Smokeless tobacco: Never Used  Substance and Sexual Activity  . Alcohol use: No    Alcohol/week: 0.0 standard drinks  . Drug use: No  . Sexual activity: Not on file  Lifestyle  . Physical activity    Days per week: Not on file    Minutes  per session: Not on file  . Stress: Not on file  Relationships  . Social Herbalist on phone: Not on file    Gets together: Not on file    Attends religious service: Not on file    Active member of club or organization: Not on file    Attends meetings of clubs or organizations: Not on file    Relationship status: Not on file  . Intimate partner violence    Fear of current or ex partner: Not on file    Emotionally abused: Not on file    Physically abused: Not on file    Forced sexual activity: Not on file  Other Topics Concern  . Not on file  Social History Narrative   Lives in [place], has husband and daughter and son. Daughter's family is living with her and husband at present. She is ambulatory daily without cane or walker.     [name] family history includes Diabetes in her brother, father, and mother; Heart attack in her brother, brother, and brother; Heart disease in her brother; Heart failure in her mother; Kidney disease in her mother; Pancreatic cancer in her brother.      Objective:    Vitals:   07/17/19 0841  BP: (!) 98/48   Pulse: 88  Temp: (!) 96.9 F (36.1 C)    Physical Exam Well-developed well-nourished elderly white female in no acute distress.  She is accompanied by her granddaughter, patient ambulates with a walker. , Weight 150, BMI 27.5  HEENT; nontraumatic normocephalic, EOMI, PER R LA, sclera anicteric. Oropharynx; not examined/wearing mask/COVID Neck; supple, no JVD Cardiovascular; regular rate and rhythm with S1-S2, no murmur rub or gallop Pulmonary; Clear bilaterally Abdomen; soft, mildly tender midabdomen, not focal, nondistended, no palpable mass or hepatosplenomegaly, bowel sounds are active Rectal; not done today, recently documented heme positive Skin; benign exam, no jaundice rash or appreciable lesions Extremities; no clubbing cyanosis or edema skin warm and dry Neuro/Psych; alert and oriented x4, grossly nonfocal mood and affect appropriate       Assessment & Plan:   #24 73 year old female with new finding of iron deficiency anemia, heme positive stool and hemoglobin in the 8-9 range. Patient has complaints of fatigue, poor appetite, weight loss of about 20 pounds and intermittent mid abdominal pain over the past few months.  Rule out underlying GI malignancy, gastric small bowel or colonic. Rule out chronic peptic ulcer disease with chronic GI blood loss, rule out other sources for GI blood loss i.e. AVMs, which however would not explain her other symptoms.  #2Chronic kidney disease stage III #3.  Mild cognitive impairment #4.  Adult onset diabetes mellitus #5.  History of lung cancer-non-small cell 2012 stage III  #6.  Prior history of CVA #7 history of tubular adenomatous polyps, last colonoscopy 2016  Plan; continue Protonix 40 mg p.o. every morning Continue ferrous sulfate 325 mg p.o. twice daily Repeat hemoglobin today Patient will be scheduled for upper endoscopy and colonoscopy with Dr. Ardis Hughs.  Both procedures were discussed in detail with the patient including  indications risks and benefits and she is agreeable to proceed. Further recommendations pending findings of above.  [name] S Laterrica Libman PA-C 07/17/2019   Cc: [name], MD

## 2019-07-17 NOTE — Patient Instructions (Signed)
If you are age 71 or older, your body mass index should be between 23-30. Your Body mass index is 27.55 kg/m. If this is out of the aforementioned range listed, please consider follow up with your Primary Care Provider.  If you are age 71 or younger, your body mass index should be between 19-25. Your Body mass index is 27.55 kg/m. If this is out of the aformentioned range listed, please consider follow up with your Primary Care Provider.   You have been scheduled for an endoscopy and colonoscopy. Please follow the written instructions given to you at your visit today. Please pick up your prep supplies at the pharmacy within the next 1-3 days. If you use inhalers (even only as needed), please bring them with you on the day of your procedure. Your physician has requested that you go to www.startemmi.com and enter the access code given to you at your visit today. This web site gives a general overview about your procedure. However, you should still follow specific instructions given to you by our office regarding your preparation for the procedure.  Your provider has requested that you go to the basement level for lab work before leaving today. Press "B" on the elevator. The lab is located at the first door on the left as you exit the elevator.  Continue the Iron supplement twice a day. Continue Protonix 40 mg daily    It was a pleasure to see you today!  Dr. Loletha Carrow

## 2019-07-19 ENCOUNTER — Telehealth: Payer: Self-pay | Admitting: Family Medicine

## 2019-07-19 DIAGNOSIS — R2681 Unsteadiness on feet: Secondary | ICD-10-CM

## 2019-07-19 DIAGNOSIS — Z794 Long term (current) use of insulin: Secondary | ICD-10-CM

## 2019-07-19 DIAGNOSIS — N183 Chronic kidney disease, stage 3 unspecified: Secondary | ICD-10-CM

## 2019-07-19 DIAGNOSIS — R5381 Other malaise: Secondary | ICD-10-CM

## 2019-07-19 DIAGNOSIS — E118 Type 2 diabetes mellitus with unspecified complications: Secondary | ICD-10-CM

## 2019-07-19 NOTE — Telephone Encounter (Signed)
Family of patient is requesting a referral to home health nurse. They are in desperate need for someone to stay with [name] during the day while [employer] and a few others are working. Before Mr. Fojtik passing a few weeks ago, he was taking care of Aletta daily, with help of others too and Daughter, Jakelyn. Aryahna and 2 others , ALL work with [employer] and will be back to work very soon and wanted to go ahead start with the process of getting Home Health to assist.    Request someone in home care, daily, from 6AM-4PM.  [name] (daughter) can be reached at [phone]  Thank you

## 2019-07-19 NOTE — Telephone Encounter (Signed)
Please advise, you last saw patient on 05/29/2019. If you would like a vv or ov, please let me know.

## 2019-07-21 NOTE — Telephone Encounter (Signed)
Maudie Mercury, please help!!

## 2019-07-22 NOTE — Telephone Encounter (Signed)
Per PCP, order placed for Home Health evaluation. Patient will need assistance with ADLs.

## 2019-07-26 NOTE — Telephone Encounter (Signed)
I sent a message to [facility] checking the status of the home health referral, this was the response I received: Message Received: Today Message Contents  [name]        The aide office reached out to the family and they said they would think about it and call back.

## 2019-07-27 NOTE — Telephone Encounter (Signed)
Thank you very much KIM!!

## 2019-08-04 ENCOUNTER — Other Ambulatory Visit: Payer: Self-pay | Admitting: Family Medicine

## 2019-08-09 ENCOUNTER — Other Ambulatory Visit: Payer: Self-pay | Admitting: *Deleted

## 2019-08-09 NOTE — Patient Outreach (Addendum)
[facility]) Care Management  08/09/2019   [name] 1946-10-10 [id number]  RN Health Coach telephone call to patient.  Hipaa compliance verified. Per patient she is doing ok. RN daughter was in the room. RN asked to speak with daughter. Per daughter her sister in law had called Research scientist (medical).  RN explained that I am unable to give the sister in law any information because she did not have the HCPOA and that would be a Hipaa violation. Per daughter she gives the patient her the breathing treatment. Per Delores(daughter) the patient has coughing spells. The patient does not have any swelling in the extremities. Patient had a fall. Last fall was last night with no injuries. Patient has fallen x3 last week. Patient has short term memory loss. Patient is incontinent of bladder and wears briefs. Patient daughter is wanting a caregiver to come and assist because she has to go back to work. Patient is unsteady. RN discussed with daughter about physical therapy and asking physician about caregiver assistance. Patient daughter will call and speak with the Dr. Patient and daughter have agreed to further outreach calls.    Current Medications:  Current Outpatient Medications  Medication Sig Dispense Refill  . acetaminophen (TYLENOL) 500 MG tablet Take 500-1,000 mg by mouth every 6 (six) hours as needed for headache (or pain).     Marland Kitchen albuterol (PROVENTIL HFA;VENTOLIN HFA) 108 (90 Base) MCG/ACT inhaler INHALE 2 PUFFS BY MOUTH EVERY 6 HOURS AS NEEDED FOR WHEEZING OR SHORTNESS OF BREATH (Patient taking differently: Inhale 2 puffs into the lungs every 6 (six) hours as needed for wheezing or shortness of breath. ) 18 g 0  . albuterol (PROVENTIL) (2.5 MG/3ML) 0.083% nebulizer solution USE 1 VIAL IN NEBULIZER EVERY 4 HOURS AS NEEDED FOR WHEEZING OR SHORTNESS OF BREATH 75 mL 0  . aspirin 81 MG chewable tablet Chew 81 mg by mouth daily.    Marland Kitchen atorvastatin (LIPITOR) 40 MG tablet Take 1 tablet  (40 mg total) by mouth daily. 90 tablet 3  . buPROPion (WELLBUTRIN XL) 150 MG 24 hr tablet Take 1 tablet by mouth once daily 30 tablet 0  . cetirizine (ZYRTEC) 10 MG tablet Take 10 mg by mouth daily.    Marland Kitchen escitalopram (LEXAPRO) 20 MG tablet Take 1 tablet by mouth once daily 90 tablet 1  . ferrous sulfate 325 (65 FE) MG EC tablet Take 325 mg by mouth 2 (two) times daily.    Marland Kitchen FLOVENT HFA 220 MCG/ACT inhaler Inhale 2 puffs by mouth twice daily 12 g 0  . furosemide (LASIX) 20 MG tablet Take 1 tablet (20 mg total) by mouth every other day. 90 tablet 1  . ibandronate (BONIVA) 150 MG tablet Take 1 tablet (150 mg total) by mouth every 30 (thirty) days. Take in the morning with a full glass of water, on an empty stomach, and do not take anything else by mouth or lie down for the next 30 min. 3 tablet 3  . Insulin Isophane & Regular Human (HUMULIN 70/30 KWIKPEN) (70-30) 100 UNIT/ML PEN Take a base dose of 9 U SQ at breakfast and 7 U SQ at evening meal + add additional units PER SLIDING SCALE EVERY MORNING AND EVENING 140-199 2UNITS, 200-250 4 UNITS, 251-299 6 UNITS, 300-349 8 UNITS, 350 OR ABOVE 10 UNITS 15 pen 1  . metFORMIN (GLUCOPHAGE) 500 MG tablet TAKE 1 TABLET BY MOUTH TWICE DAILY WITH A MEAL 180 tablet 0  . metoprolol tartrate (LOPRESSOR) 25  MG tablet TAKE 1 TABLET BY MOUTH IN THE MORNING AND 2 AT BEDTIME (Patient taking differently: Take 25-50 mg by mouth See admin instructions. Take one tablet in the morning, then take 2 tablets in the evening per daughter) 270 tablet 1  . ONE TOUCH ULTRA TEST test strip USE  STRIP TO CHECK GLUCOSE THREE TIMES DAILY 100 each 11  . ONETOUCH DELICA LANCETS 48A MISC USE 1  TO CHECK GLUCOSE THREE TIMES DAILY 100 each 11  . pantoprazole (PROTONIX) 40 MG tablet Take 1 tablet by mouth once daily 90 tablet 0  . potassium chloride (KLOR-CON) 10 MEQ tablet Take 1 tablet by mouth once daily 90 tablet 0   No current facility-administered medications for this visit.      Functional Status:  In your present state of health, do you have any difficulty performing the following activities: 08/09/2019 12/14/2018  Hearing? N Y  Vision? N N  Difficulty concentrating or making decisions? Y Y  Comment patient has short term memory loss -  Walking or climbing stairs? Y Y  Comment gait unsteady -  Dressing or bathing? N Y  Doing errands, shopping? Y Y  Comment Daughter does with patient -  Conservation officer, nature and eating ? N N  Comment daughter and grandaughter fixes food -  Using the Toilet? N Y  In the past six months, have you accidently leaked urine? Y Y  Comment patient wears adult briefs -  Do you have problems with loss of bowel control? N Y  Managing your Medications? [name]  Comment daughter fixes med box -  Managing your Finances? Benay Pike Delores daughter manages -  Runner, broadcasting/film/video? [name]  Comment family manages housekeeping -  Some recent data might be hidden    Fall/Depression Screening: Fall Risk  08/09/2019 04/19/2019 12/14/2018  Falls in the past year? 1 1 1   Comment - - -  Number falls in past yr: 1 1 1   Comment - - -  Injury with Fall? 0 0 1  Risk Factor Category  - - -  Risk for fall due to : History of fall(s);Impaired balance/gait;Impaired mobility History of fall(s);Impaired balance/gait;Impaired mobility History of fall(s);Impaired balance/gait;Impaired mobility;Mental status change  Risk for fall due to: Comment - - -  Follow up Falls evaluation completed;Falls prevention discussed;Education provided Falls evaluation completed;Education provided;Falls prevention discussed Education provided  Comment - - -   PHQ 2/9 Scores 08/09/2019 04/19/2019 12/14/2018 12/13/2018 11/12/2018 08/10/2018 05/10/2018  PHQ - 2 Score 3 3 1 3 6 5 4   PHQ- 9 Score 9 8 - 19 22 16 15    THN CM Care Plan Problem One     Most Recent Value  Care Plan Problem One  Knowledge Deficit in Self Management of COPD  Role Documenting the Problem  One  [place] for Problem One  Active  THN Long Term Goal   Patient wil not have any admisssion for COPD exacerbation within the next 90 days  Interventions for Problem One Long Term Goal  RN reiterated medication adherence. RN discusse COPD action plan. RN will follo wup with further discussion with daughter  [facility] CM Short Term Goal #1   Patient will not have any falls within the next 30 days  THN CM Short Term Goal #1 Start Date  08/09/19  Interventions for Short Term Goal #1  RN discussed with daughter about patient falls. RN sent educatioanl material for falls prevention and  how to get up from a fall. Patietn daughter will talk with PCP about getting physical therapy.RN will follow up with further discussion  THN CM Short Term Goal #2   Patient and daughter will follow up on health mainteneacne care within the next 30 days  THN CM Short Term Goal #2 Start Date  08/09/19  Interventions for Short Term Goal #2  RN discussed health maintenance. Patient family will follow thru with flu shot. Patient ha s had recent eye exam. RN will follow up with further discussion      Assessment:  Patient has fallen x 3 last week Patient is taking medications as per ordered Patient does not have any swelling in lower extremities Patient is unable to stand on regular scale for weighing due to gait unsteady  Plan:  RN discussed fall prevention RN sent fall prevention educational material RN discussed COPD action plan RN discussed Balance PT RN referred to social worker RN will follow up outreach call within the month of December

## 2019-08-12 ENCOUNTER — Telehealth: Payer: Self-pay | Admitting: Family Medicine

## 2019-08-12 ENCOUNTER — Other Ambulatory Visit: Payer: Self-pay

## 2019-08-12 NOTE — Telephone Encounter (Signed)
Last referral for [facility] done 12/13/18. Please advise, thanks.

## 2019-08-12 NOTE — Patient Outreach (Signed)
[facility]) Care Management  08/12/2019  [name] 05-12-1946 [id number]   Social work referral from [facility], [employer].  "The daughter [name] has POA. The daughter wants to know are there any programs that can help with a caregiver to come to the house. She is unable to afford one. The patient is falling and the daughter has to go back to work since school is opening as a Pharmacist, hospital. I have explained that she could discuss with the PCP for a physical therapy for balance and ask about a caregiver. The daughter is following up with PCP, but wants to know if there is any other help available" Successful outreach to daughter today.  Daughter reports that she and two other family members are assisting with care.  Two caregivers are soon returning to work and one is a Equities trader in Psychiatrist.  Informed daughter that aide services are only covered by Medicare if in conjunction with skilled services.  Patient/family cannot afford private pay services.  Based on income information provided by daughter, patient may qualify for Medicaid.  Informed daughter that patient can request personal care services if she does in fact qualify for Medicaid.  Discussed this process with her.  Will mail Medicaid application.  Informed daughter about respite program through [employer] and provided her with contact information for [employer] to inquire further about eligibility.   Will follow up within the next two weeks to ensure receipt of Medicaid application.  Ronn Melena, [name] Social Worker [phone]

## 2019-08-12 NOTE — Telephone Encounter (Signed)
I don't have any idea how I can help with this. Maybe [facility]? Can you see if [name] can help maybe?

## 2019-08-12 NOTE — Telephone Encounter (Signed)
Daughter Heinz Knuckles called to request someone come sit with patient for 4 hours a day while daughter is at work. She has requested this before. A representative came out from [name]. Unfortunately, they cannot afford [facility]. Daughter is requesting a prescription written for assistance so that Medicare and/or Medicaid could help with financial assistance  OR  Does [place] aware of any home health agency that would offer financial assistance. What is the next alternative if none of the above can be met?  Please advise. Delores can be reached at [phone]

## 2019-08-13 NOTE — Telephone Encounter (Signed)
The patient has a THN status of active, [employer] orders were put in on 12/13/18 but they could not afford [facility]. Could you assist with this?

## 2019-08-14 ENCOUNTER — Encounter: Payer: Self-pay | Admitting: Gastroenterology

## 2019-08-16 ENCOUNTER — Ambulatory Visit: Payer: Medicare Other | Admitting: Family Medicine

## 2019-08-22 ENCOUNTER — Ambulatory Visit: Payer: Medicare Other | Admitting: Family Medicine

## 2019-08-23 ENCOUNTER — Other Ambulatory Visit: Payer: Self-pay

## 2019-08-23 NOTE — Patient Outreach (Signed)
[facility]) Care Management  08/23/2019  Tarea Skillman 15-Dec-1945 [id number]   Successful follow up call to patient's daughter today, however, she has not yet received Medicaid application mailed on 91/02/89.  Will follow up again at the beginning of next week and send again if still not received.  Ronn Melena, [name] Social Worker [phone]

## 2019-08-25 ENCOUNTER — Encounter: Payer: Self-pay | Admitting: Family Medicine

## 2019-08-26 ENCOUNTER — Ambulatory Visit: Payer: Self-pay

## 2019-08-27 ENCOUNTER — Ambulatory Visit (AMBULATORY_SURGERY_CENTER): Payer: Medicare Other | Admitting: Gastroenterology

## 2019-08-27 ENCOUNTER — Encounter: Payer: Self-pay | Admitting: Gastroenterology

## 2019-08-27 ENCOUNTER — Other Ambulatory Visit: Payer: Self-pay

## 2019-08-27 VITALS — BP 109/56 | HR 82 | Temp 97.9°F | Resp 15 | Ht 62.0 in | Wt 150.0 lb

## 2019-08-27 DIAGNOSIS — K31819 Angiodysplasia of stomach and duodenum without bleeding: Secondary | ICD-10-CM | POA: Diagnosis not present

## 2019-08-27 DIAGNOSIS — K5730 Diverticulosis of large intestine without perforation or abscess without bleeding: Secondary | ICD-10-CM | POA: Diagnosis not present

## 2019-08-27 DIAGNOSIS — D509 Iron deficiency anemia, unspecified: Secondary | ICD-10-CM

## 2019-08-27 DIAGNOSIS — R195 Other fecal abnormalities: Secondary | ICD-10-CM

## 2019-08-27 HISTORY — PX: ESOPHAGOGASTRODUODENOSCOPY: SHX1529

## 2019-08-27 MED ORDER — SODIUM CHLORIDE 0.9 % IV SOLN: 500.0000 mL | Freq: Once | INTRAVENOUS | Status: DC

## 2019-08-27 NOTE — Patient Instructions (Signed)
The office will arrange a small bowel capsule for further evaluation.  Thank-you for choosing [place] for your healthcare needs today.  YOU HAD AN ENDOSCOPIC PROCEDURE TODAY AT [facility]:   Refer to the procedure report that was given to you for any specific questions about what was found during the examination.  If the procedure report does not answer your questions, please call your gastroenterologist to clarify.  If you requested that your care partner not be given the details of your procedure findings, then the procedure report has been included in a sealed envelope for you to review at your convenience later.  YOU SHOULD EXPECT: Some feelings of bloating in the abdomen. Passage of more gas than usual.  Walking can help get rid of the air that was put into your GI tract during the procedure and reduce the bloating. If you had a lower endoscopy (such as a colonoscopy or flexible sigmoidoscopy) you may notice spotting of blood in your stool or on the toilet paper. If you underwent a bowel prep for your procedure, you may not have a normal bowel movement for a few days.  Please Note:  You might notice some irritation and congestion in your nose or some drainage.  This is from the oxygen used during your procedure.  There is no need for concern and it should clear up in a day or so.  SYMPTOMS TO REPORT IMMEDIATELY:   Following lower endoscopy (colonoscopy or flexible sigmoidoscopy):  Excessive amounts of blood in the stool  Significant tenderness or worsening of abdominal pains  Swelling of the abdomen that is new, acute  Fever of 100F or higher   Following upper endoscopy (EGD)  Vomiting of blood or coffee ground material  New chest pain or pain under the shoulder blades  Painful or persistently difficult swallowing  New shortness of breath  Fever of 100F or higher  Black, tarry-looking stools  For urgent or emergent issues, a gastroenterologist can be reached at any hour  by calling [phone].   DIET:  We do recommend a small meal at first, but then you may proceed to your regular diet.  Drink plenty of fluids but you should avoid alcoholic beverages for 24 hours. Try to increase the fiber in your diet, and drink plenty of water.  ACTIVITY:  You should plan to take it easy for the rest of today and you should NOT DRIVE or use heavy machinery until tomorrow (because of the sedation medicines used during the test).    FOLLOW UP: Our staff will call the number listed on your records 48-72 hours following your procedure to check on you and address any questions or concerns that you may have regarding the information given to you following your procedure. If we do not reach you, we will leave a message.  We will attempt to reach you two times.  During this call, we will ask if you have developed any symptoms of COVID 19. If you develop any symptoms (ie: fever, flu-like symptoms, shortness of breath, cough etc.) before then, please call [phone].  If you test positive for Covid 19 in the 2 weeks post procedure, please call and report this information to [place].     SIGNATURES/CONFIDENTIALITY: You and/or your care partner have signed paperwork which will be entered into your electronic medical record.  These signatures attest to the fact that that the information above on your After Visit Summary has been reviewed and is understood.  Full responsibility  of the confidentiality of this discharge information lies with you and/or your care-partner.

## 2019-08-27 NOTE — Op Note (Signed)
[facility] Patient Name: [name] Procedure Date: 08/27/2019 10:09 AM MRN: [id number] Endoscopist: Milus Banister , MD Age: 73 Referring MD:  Date of Birth: 1946-06-05 Gender: Female Account #: [id number] Procedure:                Upper GI endoscopy Indications:              Iron deficiency anemia, hemocult + stools, weight                            loss Medicines:                Monitored Anesthesia Care Procedure:                Pre-Anesthesia Assessment:                           - Prior to the procedure, a History and Physical                            was performed, and patient medications and                            allergies were reviewed. The patient's tolerance of                            previous anesthesia was also reviewed. The risks                            and benefits of the procedure and the sedation                            options and risks were discussed with the patient.                            All questions were answered, and informed consent                            was obtained. Prior Anticoagulants: The patient has                            taken no previous anticoagulant or antiplatelet                            agents. ASA Grade Assessment: III - A patient with                            severe systemic disease. After reviewing the risks                            and benefits, the patient was deemed in                            satisfactory condition to undergo the procedure.  After obtaining informed consent, the endoscope was                            passed under direct vision. Throughout the                            procedure, the patient's blood pressure, pulse, and                            oxygen saturations were monitored continuously. The                            Endoscope was introduced through the mouth, and                            advanced to the second part of duodenum. The  upper                            GI endoscopy was accomplished without difficulty.                            The patient tolerated the procedure well. 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 None were bleeding and none bled with                            minor scope trauma, suction (collateral damage site                            from lung cancer XRT 2010?)                           The stomach was normal.                           The examined duodenum was normal. Complications:            No immediate complications. Estimated blood loss:                            None. Estimated Blood Loss:     Estimated blood loss was minimal. 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 None were bleeding and none bled                            with minor scope trauma, suction (collateral damage  site from lung cancer XRT 2010?). These are clearly                            not neoplastic and I doubt they are responsible for                            her IDA, hemocult + stools, weight loss. Recommendation:           - Patient has a contact number available for                            emergencies. The signs and symptoms of potential                            delayed complications were discussed with the                            patient. Return to normal activities tomorrow.                            Written discharge instructions were provided to the                            patient.                           - Resume previous diet.                           - Continue present medications.                           - My office will arrange for a small bowel capsulte                            endoscopy test ([facility]) to continue the workup                            of your anemia, heme +  stools, weight loss. Milus Banister, MD 08/27/2019 11:03:35 AM This report has been signed electronically.

## 2019-08-27 NOTE — Progress Notes (Signed)
Patient came into recovery room, and her daughter was called for the Dr to speak with her.  Daughter present throughout the recovery process, and helped the patient get dressed. RN present as well.

## 2019-08-27 NOTE — Op Note (Addendum)
[facility] Patient Name: [name] Procedure Date: 08/27/2019 10:09 AM MRN: [id number] Endoscopist: Milus Banister , MD Age: 73 Referring MD:  Date of Birth: 01/30/1946 Gender: Female Account #: [id number] Procedure:                Colonoscopy Indications:              Iron deficiency anemia, hemocult +, weight loss Medicines:                Monitored Anesthesia Care Procedure:                Pre-Anesthesia Assessment:                           - Prior to the procedure, a History and Physical                            was performed, and patient medications and                            allergies were reviewed. The patient's tolerance of                            previous anesthesia was also reviewed. The risks                            and benefits of the procedure and the sedation                            options and risks were discussed with the patient.                            All questions were answered, and informed consent                            was obtained. Prior Anticoagulants: The patient has                            taken no previous anticoagulant or antiplatelet                            agents. ASA Grade Assessment: III - A patient with                            severe systemic disease. After reviewing the risks                            and benefits, the patient was deemed in                            satisfactory condition to undergo the procedure.                           After obtaining informed consent, the colonoscope  was passed under direct vision. Throughout the                            procedure, the patient's blood pressure, pulse, and                            oxygen saturations were monitored continuously. The                            Colonoscope was introduced through the anus and                            advanced to the the terminal ileum. The colonoscopy                            was  performed without difficulty. The patient                            tolerated the procedure well. The quality of the                            bowel preparation was good. The terminal ileum,                            ileocecal valve, appendiceal orifice, and rectum                            were photographed. Scope In: 10:36:53 AM Scope Out: 10:47:59 AM Scope Withdrawal Time: 0 hours 6 minutes 32 seconds  Total Procedure Duration: 0 hours 11 minutes 6 seconds  Findings:                 Multiple small and large-mouthed diverticula were                            found in the entire colon.                           The exam was otherwise without abnormality on                            direct and retroflexion views. Complications:            No immediate complications. Estimated blood loss:                            None. Estimated Blood Loss:     Estimated blood loss: none. Impression:               - Diverticulosis in the entire examined colon.                           - The examination was otherwise normal on direct                            and retroflexion views.                           -  No polyps or cancers. Recommendation:           - EGD now. Milus Banister, MD 08/27/2019 10:55:35 AM This report has been signed electronically.

## 2019-08-27 NOTE — Progress Notes (Signed)
Temperature- June Bullock VS- Courtney Washington 

## 2019-08-28 ENCOUNTER — Other Ambulatory Visit: Payer: Self-pay

## 2019-08-28 ENCOUNTER — Telehealth: Payer: Self-pay

## 2019-08-28 DIAGNOSIS — D509 Iron deficiency anemia, unspecified: Secondary | ICD-10-CM

## 2019-08-28 DIAGNOSIS — R195 Other fecal abnormalities: Secondary | ICD-10-CM

## 2019-08-28 DIAGNOSIS — R634 Abnormal weight loss: Secondary | ICD-10-CM

## 2019-08-28 NOTE — Patient Outreach (Addendum)
[facility]) Care Management  08/28/2019  [name] 1946/07/20 [id number]   Successful follow up call to patient's daughter today, however, she still has not received Medicaid application that was mailed on 08/12/19.  She requested that documents be emailed to her.   Daughter confirmed receipt of documentation.   Closing social work case but did encourage her to call if additional needs or questions arise.   Ronn Melena, [name] Social Worker [phone]

## 2019-08-28 NOTE — Telephone Encounter (Signed)
Per the 11/3 procedure report pt needs small bowel capsule for anemia, heme positive stools and weight loss

## 2019-08-29 ENCOUNTER — Telehealth: Payer: Self-pay | Admitting: *Deleted

## 2019-08-29 ENCOUNTER — Other Ambulatory Visit: Payer: Self-pay

## 2019-08-29 ENCOUNTER — Encounter: Payer: Self-pay | Admitting: Family Medicine

## 2019-08-29 ENCOUNTER — Ambulatory Visit (INDEPENDENT_AMBULATORY_CARE_PROVIDER_SITE_OTHER): Payer: Medicare Other | Admitting: Family Medicine

## 2019-08-29 VITALS — BP 136/72 | HR 73 | Temp 98.1°F | Resp 16 | Ht 62.0 in | Wt 135.1 lb

## 2019-08-29 DIAGNOSIS — Z23 Encounter for immunization: Secondary | ICD-10-CM

## 2019-08-29 DIAGNOSIS — E118 Type 2 diabetes mellitus with unspecified complications: Secondary | ICD-10-CM | POA: Diagnosis not present

## 2019-08-29 DIAGNOSIS — K909 Intestinal malabsorption, unspecified: Secondary | ICD-10-CM | POA: Diagnosis not present

## 2019-08-29 DIAGNOSIS — R531 Weakness: Secondary | ICD-10-CM

## 2019-08-29 DIAGNOSIS — D509 Iron deficiency anemia, unspecified: Secondary | ICD-10-CM | POA: Diagnosis not present

## 2019-08-29 DIAGNOSIS — R296 Repeated falls: Secondary | ICD-10-CM

## 2019-08-29 DIAGNOSIS — Z7409 Other reduced mobility: Secondary | ICD-10-CM

## 2019-08-29 DIAGNOSIS — R5381 Other malaise: Secondary | ICD-10-CM | POA: Diagnosis not present

## 2019-08-29 NOTE — Progress Notes (Addendum)
OFFICE VISIT  08/29/2019   CC:  Chief Complaint  Patient presents with  . Follow-up    RCI  . Fall    in the past two weeks she has fallen 6 times. no sx before the falls. no injury with falls. she does have bruises on body daughter says   HPI:    Patient is a 73 y.o. Caucasian female who presents accompanied by her daughter for recent emotional and physical decline ever since husband passed away about 2 months ago.  She has no motivation. She has had problems with falling lately. No appetite but daughter and grandson make sure she eats some.  NO abd pain or nausea. Her grandson Martin[place] (21 y/o) cares for her during the day a lot, otherwise daughter is with her. Chronic cough and gets sob when coughing and sometimes with ambulation.  Some chest tightness and wheezing--takes albut 1-2 times per day and this helps significantly.  Uses walker and beginning to shuffle feet when walking more lately, uses walker and still falls when using walker some.  No LOC or syncope. Has generalized weakness.  Needs monitoring while ambulating and often needs assistance getting up out of bed and sometimes off couch.  Needs full assistance with meal prep due to weakness and balance difficulties.   When her grandson returns to school (potentially 10/2019) she will not have anyone to stay with her for the hours of 7AM to 3 PM.  Has been taking FeSO4 32m bid for the last 2 months.  Stool is black since taking this, formed bms. Denies hematochezia.  Recent EGD and colonoscopy 08/27/19 did not reveal any site of GI bleeding. She was never able to turn in any home hemoccult cards but a hemoccult done in the ED 06/19/19 was positive.   Home bp's have been normal. Home glucoses 130s fasting and 160s later in the day.  Rare hypoglycemia but this occurred when she stayed with a different relative and wasn't fed as normal-->glucose 50, EMS came and got her glucose back up.  Past Medical History:  Diagnosis Date  .  Age-related nuclear cataract of both eyes 2016   +cortical age related cataracts OU   . Allergy    seasonal  . Anxiety   . Arthritis    hands  . Bilateral diabetic retinopathy (HCC) 2015   Dr. MZigmund Daniel . Blood transfusion without reported diagnosis    when taking chemo  . Chronic renal insufficiency, stage 3 (moderate)    GFR 50s  . 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  .Marland KitchenCOPD (chronic obstructive pulmonary disease) ([facility]   . CVA (cerebral vascular accident) ([facility] 09/2016   MRI did show a left-sided small ischemic stroke which was acute but likely incidental (MRI was done b/c pt had TIA sx's in hospital).  Neuro put her on plavix at that time.  Cardiology d/c'd her plavix 08/2017.  . Depression   . Diabetes mellitus with complication (HCC)    diab retinopathy OU (laser)  . GERD (gastroesophageal reflux disease)    protonix  . History of concussion 08/25/2018   w/out loss of consciousness--mild increase in baseline memory impairment after.  CT head neg acute.  .Marland KitchenHistory of diverticulitis of colon    with abscess; required IR percutaneous drain placed 09/2016.  .Marland KitchenHyperlipidemia   . Hypertension   . Iron deficiency anemia    started iron 07/02/19.  GI endoscopic eval pending as of 07/02/19.  .Marland KitchenLeft  bundle branch block (LBBB) 12/16/2016  . Lung cancer ([place])    non-small cell lung ca, stage III in 05/2011; systemic chemotherapy concurrent with radiation followed by prophylactic cranial irradiation and has been observation since July of 2010 with no evidence for disease recurrence-released from onc f/u 12/2014 (needs annual cxr by PCP).  CXR 08/2015 stable.  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 Earlie Server did bx and this was NEG for malignancy.  . Mild cognitive impairment with memory loss    Likely from brain radiation therapy  . Non-obstructive CAD (coronary artery disease)    a. Cath 2006 preserved LV fxn, scattered irregularities without critical  stenosis; b. 2008 stress echo negative for ischemia, but with hypertensive response; c. 08/2016 Cath: D1 25%, otw nl.  . Normocytic anemia 2018   Iron, vit B12, folate all normal 12/2016.  Marland Kitchen Open-angle glaucoma 2016   Dr. Shirley Muscat, (bilateral)---responding to topical therapy  . Osteoporosis 03/2016; 03/2018   03/2016 DEXA T-score -2.2.  03/2018 T-score L femoral neck  -2.7--boniva started.  . Pneumonia    hospitalization 11/2016; hypoxemic resp failure--d/c'd home with home oxygen therapy  . Subclinical hyperthyroidism 2018  . Takotsubo cardiomyopathy    a. 08/2016 Echo: EF 30-35%, gr1DD, PASP 72mHg;  b. 08/2016 Cath: nonobs Dzs;  c. 11/2016 Echo: EF 40-45%, no rwma, nl RV fxn, mild TR, PASP 35mg.  ECHO 02/2017 EF 50-55%, grd II DD---essentially resolved Takotsubo 02/2017.  . 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 08/2016 in setting of diverticulitis & pneumoperitoneum and Takotsubo CM -->prolonged QT, seen by EP-->avoid meds with potential for QT prolongation.    Past Surgical History:  Procedure Laterality Date  . ABDOMINAL HYSTERECTOMY  1997  . APPENDECTOMY    . CARDIAC CATHETERIZATION N/A 09/14/2016   Procedure: Left Heart Cath and Coronary Angiography;  Surgeon: ThTroy SineMD;  [facility];  Service: Cardiovascular; Mild nonobstructive CAD with 85% smooth narrowing in the first diagonal branch of the LAD; 10% smooth narrowing of the ostial proximal left circumflex coronary artery; and a normal dominant RCA.  . Marland KitchenARPAL TUNNEL RELEASE    . CATARACT EXTRACTION Bilateral   . CHOLECYSTECTOMY  2002  . COLONOSCOPY  10/24/00   normal.  [employer] hemoccult testing via [place]/25/16 was NEG  . dexa  03/2016; 03/2018   03/2018 T-score L femoral neck  -2.7 (worsened compared to osteopenic range 2017.)  . IR GENERIC HISTORICAL  09/19/2016   IR SINUS/FIST TUBE CHK-NON GI 09/19/2016 JaCorrie MckusickDO [facility]  . IR GENERIC HISTORICAL  10/06/2016   IR RADIOLOGIST EVAL & MGMT  10/06/2016 [facility]  . IR GENERIC HISTORICAL  10/26/2016   IR RADIOLOGIST EVAL & MGMT 10/26/2016 [facility]  . IR GENERIC HISTORICAL  11/03/2016   IR RADIOLOGIST EVAL & MGMT 11/03/2016 WeArdis RowanPA-C [facility]  . IR GENERIC HISTORICAL  11/24/2016   IR RADIOLOGIST EVAL & MGMT 11/24/2016 WeArdis RowanPA-C [facility]  . IR GENERIC HISTORICAL  12/05/2016   Fistula smaller/improving.  IR SINUS/FIST TUBE CHK-NON GI 12/05/2016 JoSandi MariscalMD [facility]  . IR GENERIC HISTORICAL  12/22/2016   IR RADIOLOGIST EVAL & MGMT 12/22/2016 MiGreggory KeenMD [facility]  . IR RADIOLOGIST EVAL & MGMT  01/10/2017  . IR RADIOLOGIST EVAL & MGMT  02/09/2017  . IR SINUS/FIST TUBE CHK-NON GI  03/28/2017  . LEFT HEART CATHETERIZATION WITH CORONARY ANGIOGRAM N/A 05/30/2012   Procedure:  LEFT HEART CATHETERIZATION WITH CORONARY ANGIOGRAM;  Surgeon: Burnell Blanks, MD;  Location: [facility];  Service: Cardiovascular: Mild, non-obstructive CAD  . TRANSTHORACIC ECHOCARDIOGRAM  09/14/2016; 11/2016   08/2016: EF 30-35 %, Akinesis of the mid-apicalanteroseptal myocardium.  Grade I DD.  Mild pulm HTN.  Repeat echo 11/2016, EF 40-45%, no rwma, nl RV fxn, mild TR, PASP 47mHg.    .Marland KitchenTRANSTHORACIC ECHOCARDIOGRAM  03/07/2017   EF 50-55%. Hypokinesis of the distal septum with overall low normal LV,  systolic function; mild diastolic dysfunction with elevated LV  filling pressure; mildly calcified aortic valve with mild AI; small pericardial effusion    Outpatient Medications Prior to Visit  Medication Sig Dispense Refill  . acetaminophen (TYLENOL) 500 MG tablet Take 500-1,000 mg by mouth every 6 (six) hours as needed for headache (or pain).     .Marland Kitchenalbuterol (PROVENTIL HFA;VENTOLIN HFA) 108 (90 Base) MCG/ACT inhaler INHALE 2 PUFFS BY MOUTH EVERY 6 HOURS AS NEEDED FOR WHEEZING OR SHORTNESS OF BREATH (Patient taking differently: Inhale 2 puffs into the lungs every 6 (six) hours as needed for  wheezing or shortness of breath. ) 18 g 0  . albuterol (PROVENTIL) (2.5 MG/3ML) 0.083% nebulizer solution USE 1 VIAL IN NEBULIZER EVERY 4 HOURS AS NEEDED FOR WHEEZING OR SHORTNESS OF BREATH 75 mL 0  . aspirin 81 MG chewable tablet Chew 81 mg by mouth daily.    .Marland Kitchenatorvastatin (LIPITOR) 40 MG tablet Take 1 tablet (40 mg total) by mouth daily. 90 tablet 3  . buPROPion (WELLBUTRIN XL) 150 MG 24 hr tablet Take 1 tablet by mouth once daily 30 tablet 0  . cetirizine (ZYRTEC) 10 MG tablet Take 10 mg by mouth daily.    .Marland Kitchenescitalopram (LEXAPRO) 20 MG tablet Take 1 tablet by mouth once daily 90 tablet 1  . ferrous sulfate 325 (65 FE) MG EC tablet Take 325 mg by mouth 2 (two) times daily.    .Marland KitchenFLOVENT HFA 220 MCG/ACT inhaler Inhale 2 puffs by mouth twice daily 12 g 0  . furosemide (LASIX) 20 MG tablet Take 1 tablet (20 mg total) by mouth every other day. 90 tablet 1  . ibandronate (BONIVA) 150 MG tablet Take 1 tablet (150 mg total) by mouth every 30 (thirty) days. Take in the morning with a full glass of water, on an empty stomach, and do not take anything else by mouth or lie down for the next 30 min. 3 tablet 3  . Insulin Isophane & Regular Human (HUMULIN 70/30 KWIKPEN) (70-30) 100 UNIT/ML PEN Take a base dose of 9 U SQ at breakfast and 7 U SQ at evening meal + add additional units PER SLIDING SCALE EVERY MORNING AND EVENING 140-199 2UNITS, 200-250 4 UNITS, 251-299 6 UNITS, 300-349 8 UNITS, 350 OR ABOVE 10 UNITS 15 pen 1  . metFORMIN (GLUCOPHAGE) 500 MG tablet TAKE 1 TABLET BY MOUTH TWICE DAILY WITH A MEAL 180 tablet 0  . metoprolol tartrate (LOPRESSOR) 25 MG tablet TAKE 1 TABLET BY MOUTH IN THE MORNING AND 2 AT BEDTIME (Patient taking differently: Take 25-50 mg by mouth See admin instructions. Take one tablet in the morning, then take 2 tablets in the evening per daughter) 270 tablet 1  . ONE TOUCH ULTRA TEST test strip USE  STRIP TO CHECK GLUCOSE THREE TIMES DAILY 100 each 11  . ONETOUCH DELICA LANCETS 382L MISC USE 1  TO CHECK GLUCOSE THREE TIMES DAILY 100 each 11  . pantoprazole (PROTONIX) 40 MG  tablet Take 1 tablet by mouth once daily 90 tablet 0  . potassium chloride (KLOR-CON) 10 MEQ tablet Take 1 tablet by mouth once daily 90 tablet 0   No facility-administered medications prior to visit.     Allergies  Allergen Reactions  . Lactose Intolerance (Gi) Diarrhea  . Ibuprofen Other (See Comments)    REACTION: Anxiousness, hyperventilates   Social History   Socioeconomic History  . Marital status: Married    Spouse name: Not on file  . Number of children: Not on file  . Years of education: Not on file  . Highest education level: Not on file  Occupational History  . Occupation: Retired Public affairs consultant: UNEMPLOYED  Social Needs  . Financial resource strain: Not on file  . Food insecurity    Worry: Not on file    Inability: Not on file  . Transportation needs    Medical: Not on file    Non-medical: Not on file  Tobacco Use  . Smoking status: Former Smoker    Packs/day: 1.00    Years: 45.00    Pack years: 45.00    Types: Cigarettes    Quit date: 10/24/2008    Years since quitting: 10.8  . Smokeless tobacco: Never Used  Substance and Sexual Activity  . Alcohol use: No    Alcohol/week: 0.0 standard drinks  . Drug use: No  . Sexual activity: Not on file  Lifestyle  . Physical activity    Days per week: Not on file    Minutes per session: Not on file  . Stress: Not on file  Relationships  . Social Herbalist on phone: Not on file    Gets together: Not on file    Attends religious service: Not on file    Active member of club or organization: Not on file    Attends meetings of clubs or organizations: Not on file    Relationship status: Not on file  Other Topics Concern  . Not on file  Social History Narrative   Widow, lives in [place] with her daughter [name].   Husband died 07-22-19 (esoph cancer, copd, chf).   No alcohol.    ROS As  per HPI  PE: Blood pressure 136/72, pulse 73, temperature 98.1 F (36.7 C), temperature source Temporal, resp. rate 16, height '5\' 2"'$  (1.575 m), weight 135 lb 2 oz (61.3 kg), SpO2 96 %. Body mass index is 24.71 kg/m.  Gen: Alert, well appearing, blunt affect as per her usual.  Patient is oriented to person, place, time, and situation. CV: RRR, 5-1/0 systolic murmur w/out rub or gallop. Chest is clear, no wheezing or rales. Normal symmetric air entry throughout both lung fields. No chest wall deformities or tenderness. ABD: soft, NT/ND EXT: no clubbing or cyanosis.  no edema.  SKIN: no pallor or jaundice   LABS:  Lab Results  Component Value Date   TSH 1.24 04/20/2017   Lab Results  Component Value Date   WBC 7.1 07/17/2019   HGB 9.2 (L) 07/17/2019   HCT 29.5 (L) 07/17/2019   MCV 74.2 (L) 07/17/2019   PLT 288.0 07/17/2019   Lab Results  Component Value Date   IRON 17 (L) 06/27/2019   TIBC 353 06/27/2019   FERRITIN 13 (L) 06/27/2019   Lab Results  Component Value Date   VITAMINB12 394 12/27/2016    Lab Results  Component Value Date   CREATININE 0.96 06/19/2019   BUN 10  06/19/2019   NA 138 06/19/2019   K 4.3 06/19/2019   CL 105 06/19/2019   CO2 23 06/19/2019   Lab Results  Component Value Date   ALT 16 06/19/2019   AST 20 06/19/2019   ALKPHOS 63 06/19/2019   BILITOT 0.6 06/19/2019   Lab Results  Component Value Date   CHOL 111 09/19/2018   Lab Results  Component Value Date   HDL 34 (L) 09/19/2018   Lab Results  Component Value Date   LDLCALC 48 09/19/2018   Lab Results  Component Value Date   TRIG 147 09/19/2018   Lab Results  Component Value Date   CHOLHDL 3.3 09/19/2018   Lab Results  Component Value Date   HGBA1C 6.1 05/30/2019    IMPRESSION AND PLAN:  1) Grief response: reassured.  No med changes. Warned about signs of SI or HI= medical emergency.  2) COPD: symptoms fairly well controlled with albuterol use but she is resistant to  using neb/inhaler sometimes.    3) Iron def anemia, heme+ 06/19/19: has been on iron 2 mo so we'll recheck cbc and iron panel. EGD/colonoscopy unrevealing.  Capsule endoscopy to be done per GI MD. If no change in iron levels on her check today then will recommend IV iron. Vit b12 and folate today (?malabsorption syndrome).  4) DM 2: home glucoses fine.  HbA1c today.  No insulin dose changes at this time but family knows they can down-titrate her insulin if her PO intake drops to the extent that she's having low glucoses.  5) CRI III: she doesn't take NSAIDs.  Her hydration sounds borderline at best.  BMET today. Will need to d/c her metformin if GFR <45 ml/min.  She needs HH for PT/OT and we'll see if she can get some resp therapy attention as well as HH aid/sitter.  Spent 45 min with pt today, with >50% of this time spent in counseling and care coordination regarding the above problems.  An After Visit Summary was printed and given to the patient.  FOLLOW UP: Return in about 2 months (around 10/29/2019) for telemed f/u chronic illness.  Signed:  Crissie Sickles, MD           08/29/2019

## 2019-08-29 NOTE — Telephone Encounter (Signed)
  Follow up Call-  Call back number 08/27/2019  Post procedure Call Back phone  # [phone]  Permission to leave phone message Yes  Some recent data might be hidden     Patient questions:  Phone just kept ringing.

## 2019-08-29 NOTE — Telephone Encounter (Signed)
Left message on machine to call back  

## 2019-08-29 NOTE — Telephone Encounter (Signed)
  Follow up Call-  Call back number 08/27/2019  Post procedure Call Back phone  # [phone]  Permission to leave phone message Yes  Some recent data might be hidden     Patient questions:  Do you have a fever, pain , or abdominal swelling? No. Pain Score  0 *  Have you tolerated food without any problems? Yes.    Have you been able to return to your normal activities? Yes.    Do you have any questions about your discharge instructions: Diet   No. Medications  No. Follow up visit  No.  Do you have questions or concerns about your Care? No.  Actions: * If pain score is 4 or above: No action needed, pain <4.  1. Have you developed a fever since your procedure? no  2.   Have you had an respiratory symptoms (SOB or cough) since your procedure? no  3.   Have you tested positive for COVID 19 since your procedure no  4.   Have you had any family members/close contacts diagnosed with the COVID 19 since your procedure?  no   If yes to any of these questions please route to Joylene John, RN and Alphonsa Gin, Therapist, sports.

## 2019-08-30 LAB — CBC WITH DIFFERENTIAL/PLATELET
Basophils Absolute: 0.1 10*3/uL (ref 0.0–0.1)
Basophils Relative: 1.2 % (ref 0.0–3.0)
Eosinophils Absolute: 0.2 10*3/uL (ref 0.0–0.7)
Eosinophils Relative: 2.9 % (ref 0.0–5.0)
HCT: 32.3 % — ABNORMAL LOW (ref 36.0–46.0)
Hemoglobin: 10.3 g/dL — ABNORMAL LOW (ref 12.0–15.0)
Lymphocytes Relative: 16.2 % (ref 12.0–46.0)
Lymphs Abs: 1.4 10*3/uL (ref 0.7–4.0)
MCHC: 31.8 g/dL (ref 30.0–36.0)
MCV: 82.9 fl (ref 78.0–100.0)
Monocytes Absolute: 0.9 10*3/uL (ref 0.1–1.0)
Monocytes Relative: 10.5 % (ref 3.0–12.0)
Neutro Abs: 5.8 10*3/uL (ref 1.4–7.7)
Neutrophils Relative %: 69.2 % (ref 43.0–77.0)
Platelets: 287 10*3/uL (ref 150.0–400.0)
RBC: 3.89 Mil/uL (ref 3.87–5.11)
RDW: 24.4 % — ABNORMAL HIGH (ref 11.5–15.5)
WBC: 8.4 10*3/uL (ref 4.0–10.5)

## 2019-08-30 LAB — IRON,TIBC AND FERRITIN PANEL
%SAT: 13 % (calc) — ABNORMAL LOW (ref 16–45)
Ferritin: 38 ng/mL (ref 16–288)
Iron: 40 ug/dL — ABNORMAL LOW (ref 45–160)
TIBC: 297 mcg/dL (calc) (ref 250–450)

## 2019-08-30 LAB — FOLATE: Folate: >24.1 ng/mL (ref >5.9)

## 2019-08-30 LAB — BASIC METABOLIC PANEL
BUN: 12 mg/dL (ref 6–23)
CO2: 25 mEq/L (ref 19–32)
Calcium: 8.9 mg/dL (ref 8.4–10.5)
Chloride: 96 mEq/L (ref 96–112)
Creatinine, Ser: 0.94 mg/dL (ref 0.40–1.20)
GFR: 58.67 mL/min — ABNORMAL LOW (ref >60.00)
Glucose, Bld: 63 mg/dL — ABNORMAL LOW (ref 70–99)
Potassium: 4.5 mEq/L (ref 3.5–5.1)
Sodium: 130 mEq/L — ABNORMAL LOW (ref 135–145)

## 2019-08-30 LAB — VITAMIN B12: Vitamin B-12: 863 pg/mL (ref 211–911)

## 2019-08-30 LAB — HEMOGLOBIN A1C: Hgb A1c MFr Bld: 5.1 % (ref 4.6–6.5)

## 2019-08-30 NOTE — Telephone Encounter (Signed)
The pt has been scheduled for capsule on 09/24/19.  Referral in Epic pt aware and instructed.  Instructions mailed to the pt home.  Form on Dr Ardis Hughs desk to complete.

## 2019-08-30 NOTE — Telephone Encounter (Signed)
Left message on machine to call back  

## 2019-09-01 ENCOUNTER — Other Ambulatory Visit: Payer: Self-pay | Admitting: Family Medicine

## 2019-09-02 ENCOUNTER — Other Ambulatory Visit: Payer: Self-pay

## 2019-09-02 DIAGNOSIS — Z794 Long term (current) use of insulin: Secondary | ICD-10-CM

## 2019-09-02 DIAGNOSIS — E118 Type 2 diabetes mellitus with unspecified complications: Secondary | ICD-10-CM

## 2019-09-02 MED ORDER — HUMULIN 70/30 KWIKPEN (70-30) 100 UNIT/ML ~~LOC~~ SUPN: PEN_INJECTOR | SUBCUTANEOUS | 1 refills | Status: DC

## 2019-09-02 NOTE — Telephone Encounter (Signed)
RF request for furosemide 20mg .  Looks like it was not prescribed by DR McGowen previously.  Please advise if this RF needs to be sent to specialist?

## 2019-09-02 NOTE — Telephone Encounter (Signed)
I recently sent a result note instructing pt to d/c lasix use at this time. Pls make sure she got this message.  I'll send in lasix eRx but this is only to have on hand in case we get her back on it in near future.-thx

## 2019-09-03 ENCOUNTER — Other Ambulatory Visit: Payer: Self-pay | Admitting: Family Medicine

## 2019-09-03 NOTE — Telephone Encounter (Signed)
Tried to contact patient, but no answer and I was unable to leave a message.  I will continue to try to reach patient to give her information from provider.

## 2019-09-03 NOTE — Telephone Encounter (Signed)
Patient aware to not take lasix at this time, verbalized understanding.

## 2019-09-07 DIAGNOSIS — E785 Hyperlipidemia, unspecified: Secondary | ICD-10-CM | POA: Diagnosis not present

## 2019-09-07 DIAGNOSIS — R269 Unspecified abnormalities of gait and mobility: Secondary | ICD-10-CM | POA: Diagnosis not present

## 2019-09-07 DIAGNOSIS — I10 Essential (primary) hypertension: Secondary | ICD-10-CM | POA: Diagnosis not present

## 2019-09-07 DIAGNOSIS — N189 Chronic kidney disease, unspecified: Secondary | ICD-10-CM | POA: Diagnosis not present

## 2019-09-07 DIAGNOSIS — I2510 Atherosclerotic heart disease of native coronary artery without angina pectoris: Secondary | ICD-10-CM | POA: Diagnosis not present

## 2019-09-07 DIAGNOSIS — M6281 Muscle weakness (generalized): Secondary | ICD-10-CM | POA: Diagnosis not present

## 2019-09-07 DIAGNOSIS — D649 Anemia, unspecified: Secondary | ICD-10-CM | POA: Diagnosis not present

## 2019-09-07 DIAGNOSIS — Z9181 History of falling: Secondary | ICD-10-CM | POA: Diagnosis not present

## 2019-09-07 DIAGNOSIS — J449 Chronic obstructive pulmonary disease, unspecified: Secondary | ICD-10-CM | POA: Diagnosis not present

## 2019-09-07 DIAGNOSIS — E1142 Type 2 diabetes mellitus with diabetic polyneuropathy: Secondary | ICD-10-CM | POA: Diagnosis not present

## 2019-09-09 ENCOUNTER — Encounter: Payer: Self-pay | Admitting: *Deleted

## 2019-09-10 DIAGNOSIS — Z9181 History of falling: Secondary | ICD-10-CM | POA: Diagnosis not present

## 2019-09-10 DIAGNOSIS — J449 Chronic obstructive pulmonary disease, unspecified: Secondary | ICD-10-CM | POA: Diagnosis not present

## 2019-09-10 DIAGNOSIS — E785 Hyperlipidemia, unspecified: Secondary | ICD-10-CM | POA: Diagnosis not present

## 2019-09-10 DIAGNOSIS — I10 Essential (primary) hypertension: Secondary | ICD-10-CM | POA: Diagnosis not present

## 2019-09-10 DIAGNOSIS — R269 Unspecified abnormalities of gait and mobility: Secondary | ICD-10-CM | POA: Diagnosis not present

## 2019-09-10 DIAGNOSIS — M6281 Muscle weakness (generalized): Secondary | ICD-10-CM | POA: Diagnosis not present

## 2019-09-10 DIAGNOSIS — I2510 Atherosclerotic heart disease of native coronary artery without angina pectoris: Secondary | ICD-10-CM | POA: Diagnosis not present

## 2019-09-10 DIAGNOSIS — N189 Chronic kidney disease, unspecified: Secondary | ICD-10-CM | POA: Diagnosis not present

## 2019-09-10 DIAGNOSIS — E1142 Type 2 diabetes mellitus with diabetic polyneuropathy: Secondary | ICD-10-CM | POA: Diagnosis not present

## 2019-09-10 DIAGNOSIS — D649 Anemia, unspecified: Secondary | ICD-10-CM | POA: Diagnosis not present

## 2019-09-11 DIAGNOSIS — I10 Essential (primary) hypertension: Secondary | ICD-10-CM | POA: Diagnosis not present

## 2019-09-11 DIAGNOSIS — E1142 Type 2 diabetes mellitus with diabetic polyneuropathy: Secondary | ICD-10-CM | POA: Diagnosis not present

## 2019-09-11 DIAGNOSIS — R269 Unspecified abnormalities of gait and mobility: Secondary | ICD-10-CM | POA: Diagnosis not present

## 2019-09-11 DIAGNOSIS — J449 Chronic obstructive pulmonary disease, unspecified: Secondary | ICD-10-CM | POA: Diagnosis not present

## 2019-09-11 DIAGNOSIS — Z9181 History of falling: Secondary | ICD-10-CM | POA: Diagnosis not present

## 2019-09-11 DIAGNOSIS — M6281 Muscle weakness (generalized): Secondary | ICD-10-CM | POA: Diagnosis not present

## 2019-09-11 DIAGNOSIS — N189 Chronic kidney disease, unspecified: Secondary | ICD-10-CM | POA: Diagnosis not present

## 2019-09-11 DIAGNOSIS — D649 Anemia, unspecified: Secondary | ICD-10-CM | POA: Diagnosis not present

## 2019-09-11 DIAGNOSIS — E785 Hyperlipidemia, unspecified: Secondary | ICD-10-CM | POA: Diagnosis not present

## 2019-09-11 DIAGNOSIS — I2510 Atherosclerotic heart disease of native coronary artery without angina pectoris: Secondary | ICD-10-CM | POA: Diagnosis not present

## 2019-09-13 DIAGNOSIS — I2510 Atherosclerotic heart disease of native coronary artery without angina pectoris: Secondary | ICD-10-CM | POA: Diagnosis not present

## 2019-09-13 DIAGNOSIS — Z9181 History of falling: Secondary | ICD-10-CM | POA: Diagnosis not present

## 2019-09-13 DIAGNOSIS — D649 Anemia, unspecified: Secondary | ICD-10-CM | POA: Diagnosis not present

## 2019-09-13 DIAGNOSIS — R269 Unspecified abnormalities of gait and mobility: Secondary | ICD-10-CM | POA: Diagnosis not present

## 2019-09-13 DIAGNOSIS — M6281 Muscle weakness (generalized): Secondary | ICD-10-CM | POA: Diagnosis not present

## 2019-09-13 DIAGNOSIS — I10 Essential (primary) hypertension: Secondary | ICD-10-CM | POA: Diagnosis not present

## 2019-09-13 DIAGNOSIS — N189 Chronic kidney disease, unspecified: Secondary | ICD-10-CM | POA: Diagnosis not present

## 2019-09-13 DIAGNOSIS — E1142 Type 2 diabetes mellitus with diabetic polyneuropathy: Secondary | ICD-10-CM | POA: Diagnosis not present

## 2019-09-13 DIAGNOSIS — J449 Chronic obstructive pulmonary disease, unspecified: Secondary | ICD-10-CM | POA: Diagnosis not present

## 2019-09-13 DIAGNOSIS — E785 Hyperlipidemia, unspecified: Secondary | ICD-10-CM | POA: Diagnosis not present

## 2019-09-15 ENCOUNTER — Other Ambulatory Visit: Payer: Self-pay | Admitting: Family Medicine

## 2019-09-16 DIAGNOSIS — I2510 Atherosclerotic heart disease of native coronary artery without angina pectoris: Secondary | ICD-10-CM | POA: Diagnosis not present

## 2019-09-16 DIAGNOSIS — E1142 Type 2 diabetes mellitus with diabetic polyneuropathy: Secondary | ICD-10-CM | POA: Diagnosis not present

## 2019-09-16 DIAGNOSIS — E785 Hyperlipidemia, unspecified: Secondary | ICD-10-CM | POA: Diagnosis not present

## 2019-09-16 DIAGNOSIS — I10 Essential (primary) hypertension: Secondary | ICD-10-CM | POA: Diagnosis not present

## 2019-09-16 DIAGNOSIS — D649 Anemia, unspecified: Secondary | ICD-10-CM | POA: Diagnosis not present

## 2019-09-16 DIAGNOSIS — N189 Chronic kidney disease, unspecified: Secondary | ICD-10-CM | POA: Diagnosis not present

## 2019-09-16 DIAGNOSIS — Z9181 History of falling: Secondary | ICD-10-CM | POA: Diagnosis not present

## 2019-09-16 DIAGNOSIS — R269 Unspecified abnormalities of gait and mobility: Secondary | ICD-10-CM | POA: Diagnosis not present

## 2019-09-16 DIAGNOSIS — J449 Chronic obstructive pulmonary disease, unspecified: Secondary | ICD-10-CM | POA: Diagnosis not present

## 2019-09-16 DIAGNOSIS — M6281 Muscle weakness (generalized): Secondary | ICD-10-CM | POA: Diagnosis not present

## 2019-09-18 ENCOUNTER — Telehealth: Payer: Self-pay

## 2019-09-18 DIAGNOSIS — I10 Essential (primary) hypertension: Secondary | ICD-10-CM | POA: Diagnosis not present

## 2019-09-18 DIAGNOSIS — F330 Major depressive disorder, recurrent, mild: Secondary | ICD-10-CM

## 2019-09-18 DIAGNOSIS — Z9181 History of falling: Secondary | ICD-10-CM | POA: Diagnosis not present

## 2019-09-18 DIAGNOSIS — E785 Hyperlipidemia, unspecified: Secondary | ICD-10-CM | POA: Diagnosis not present

## 2019-09-18 DIAGNOSIS — J449 Chronic obstructive pulmonary disease, unspecified: Secondary | ICD-10-CM | POA: Diagnosis not present

## 2019-09-18 DIAGNOSIS — E1142 Type 2 diabetes mellitus with diabetic polyneuropathy: Secondary | ICD-10-CM | POA: Diagnosis not present

## 2019-09-18 DIAGNOSIS — D649 Anemia, unspecified: Secondary | ICD-10-CM | POA: Diagnosis not present

## 2019-09-18 DIAGNOSIS — I2510 Atherosclerotic heart disease of native coronary artery without angina pectoris: Secondary | ICD-10-CM | POA: Diagnosis not present

## 2019-09-18 DIAGNOSIS — R269 Unspecified abnormalities of gait and mobility: Secondary | ICD-10-CM | POA: Diagnosis not present

## 2019-09-18 DIAGNOSIS — N189 Chronic kidney disease, unspecified: Secondary | ICD-10-CM | POA: Diagnosis not present

## 2019-09-18 DIAGNOSIS — M6281 Muscle weakness (generalized): Secondary | ICD-10-CM | POA: Diagnosis not present

## 2019-09-18 NOTE — Telephone Encounter (Signed)
This encounter was created in error - please disregard.

## 2019-09-18 NOTE — Telephone Encounter (Signed)
Signed and put in box to go up front.  

## 2019-09-18 NOTE — Telephone Encounter (Signed)
Received forms for [facility] certification and plan of care. PCP will review and sign, if appropriate.

## 2019-09-23 ENCOUNTER — Encounter (INDEPENDENT_AMBULATORY_CARE_PROVIDER_SITE_OTHER): Payer: Medicare Other | Admitting: Ophthalmology

## 2019-09-23 DIAGNOSIS — E1142 Type 2 diabetes mellitus with diabetic polyneuropathy: Secondary | ICD-10-CM | POA: Diagnosis not present

## 2019-09-23 DIAGNOSIS — E113512 Type 2 diabetes mellitus with proliferative diabetic retinopathy with macular edema, left eye: Secondary | ICD-10-CM

## 2019-09-23 DIAGNOSIS — E113591 Type 2 diabetes mellitus with proliferative diabetic retinopathy without macular edema, right eye: Secondary | ICD-10-CM

## 2019-09-23 DIAGNOSIS — E11311 Type 2 diabetes mellitus with unspecified diabetic retinopathy with macular edema: Secondary | ICD-10-CM | POA: Diagnosis not present

## 2019-09-23 DIAGNOSIS — E785 Hyperlipidemia, unspecified: Secondary | ICD-10-CM | POA: Diagnosis not present

## 2019-09-23 DIAGNOSIS — M6281 Muscle weakness (generalized): Secondary | ICD-10-CM | POA: Diagnosis not present

## 2019-09-23 DIAGNOSIS — H35033 Hypertensive retinopathy, bilateral: Secondary | ICD-10-CM

## 2019-09-23 DIAGNOSIS — H43813 Vitreous degeneration, bilateral: Secondary | ICD-10-CM

## 2019-09-23 DIAGNOSIS — I2510 Atherosclerotic heart disease of native coronary artery without angina pectoris: Secondary | ICD-10-CM | POA: Diagnosis not present

## 2019-09-23 DIAGNOSIS — Z9181 History of falling: Secondary | ICD-10-CM | POA: Diagnosis not present

## 2019-09-23 DIAGNOSIS — D649 Anemia, unspecified: Secondary | ICD-10-CM | POA: Diagnosis not present

## 2019-09-23 DIAGNOSIS — N189 Chronic kidney disease, unspecified: Secondary | ICD-10-CM | POA: Diagnosis not present

## 2019-09-23 DIAGNOSIS — I10 Essential (primary) hypertension: Secondary | ICD-10-CM | POA: Diagnosis not present

## 2019-09-23 DIAGNOSIS — J449 Chronic obstructive pulmonary disease, unspecified: Secondary | ICD-10-CM | POA: Diagnosis not present

## 2019-09-23 DIAGNOSIS — R269 Unspecified abnormalities of gait and mobility: Secondary | ICD-10-CM | POA: Diagnosis not present

## 2019-09-24 ENCOUNTER — Ambulatory Visit: Payer: Medicare Other | Admitting: Gastroenterology

## 2019-09-24 NOTE — Patient Instructions (Signed)
Gave dietary instructions for the rest of the day, retrieval and mailing instructions.

## 2019-09-24 NOTE — Progress Notes (Signed)
Patient states prep went well. She swallowed capsule without difficulty. Daughter-in-Law with patient because of fall risk. Patient also using a walker. Gave instructions for the rest of the day, for retrieval, and for mailing capsule.

## 2019-09-25 ENCOUNTER — Other Ambulatory Visit: Payer: Self-pay | Admitting: *Deleted

## 2019-09-25 DIAGNOSIS — I10 Essential (primary) hypertension: Secondary | ICD-10-CM | POA: Diagnosis not present

## 2019-09-25 DIAGNOSIS — R269 Unspecified abnormalities of gait and mobility: Secondary | ICD-10-CM | POA: Diagnosis not present

## 2019-09-25 DIAGNOSIS — I2510 Atherosclerotic heart disease of native coronary artery without angina pectoris: Secondary | ICD-10-CM | POA: Diagnosis not present

## 2019-09-25 DIAGNOSIS — N189 Chronic kidney disease, unspecified: Secondary | ICD-10-CM | POA: Diagnosis not present

## 2019-09-25 DIAGNOSIS — D649 Anemia, unspecified: Secondary | ICD-10-CM | POA: Diagnosis not present

## 2019-09-25 DIAGNOSIS — M6281 Muscle weakness (generalized): Secondary | ICD-10-CM | POA: Diagnosis not present

## 2019-09-25 DIAGNOSIS — J449 Chronic obstructive pulmonary disease, unspecified: Secondary | ICD-10-CM | POA: Diagnosis not present

## 2019-09-25 DIAGNOSIS — E1142 Type 2 diabetes mellitus with diabetic polyneuropathy: Secondary | ICD-10-CM | POA: Diagnosis not present

## 2019-09-25 DIAGNOSIS — E785 Hyperlipidemia, unspecified: Secondary | ICD-10-CM | POA: Diagnosis not present

## 2019-09-25 DIAGNOSIS — Z9181 History of falling: Secondary | ICD-10-CM | POA: Diagnosis not present

## 2019-09-25 NOTE — Patient Outreach (Signed)
[facility]) Care Management  [place]  09/25/2019   Shagun Wordell September 04, 1946 [id number]  RN Health Coach telephone call to patient.  [place] compliance verified. Per patient she is doing fairly well.  Patient is not having any additional difficulty breathing. Mainly shortness of breath on exertion and is weak from lack of exercising per patient they wore her out.  Patient has not fallen since last outreach. Per patient  physical therapy is working with her. Per patient she is taking her medication as per ordered. Patient has agreed to further outreach calls.   Encounter Medications:  Outpatient Encounter Medications as of 09/25/2019  Medication Sig  . acetaminophen (TYLENOL) 500 MG tablet Take 500-1,000 mg by mouth every 6 (six) hours as needed for headache (or pain).   Marland Kitchen albuterol (PROVENTIL HFA;VENTOLIN HFA) 108 (90 Base) MCG/ACT inhaler INHALE 2 PUFFS BY MOUTH EVERY 6 HOURS AS NEEDED FOR WHEEZING OR SHORTNESS OF BREATH (Patient taking differently: Inhale 2 puffs into the lungs every 6 (six) hours as needed for wheezing or shortness of breath. )  . albuterol (PROVENTIL) (2.5 MG/3ML) 0.083% nebulizer solution USE 1 VIAL IN NEBULIZER EVERY 4 HOURS AS NEEDED FOR WHEEZING AND FOR SHORTNESS OF BREATH  . aspirin 81 MG chewable tablet Chew 81 mg by mouth daily.  Marland Kitchen atorvastatin (LIPITOR) 40 MG tablet Take 1 tablet (40 mg total) by mouth daily.  Marland Kitchen buPROPion (WELLBUTRIN XL) 150 MG 24 hr tablet Take 1 tablet by mouth once daily  . cetirizine (ZYRTEC) 10 MG tablet Take 10 mg by mouth daily.  Marland Kitchen escitalopram (LEXAPRO) 20 MG tablet Take 1 tablet by mouth once daily  . ferrous sulfate 325 (65 FE) MG EC tablet Take 325 mg by mouth 2 (two) times daily.  Marland Kitchen FLOVENT HFA 220 MCG/ACT inhaler Inhale 2 puffs by mouth twice daily  . furosemide (LASIX) 20 MG tablet 1 tab po qd AS NEEDED for lower extremity swelling  . ibandronate (BONIVA) 150 MG tablet Take 1 tablet (150 mg total) by  mouth every 30 (thirty) days. Take in the morning with a full glass of water, on an empty stomach, and do not take anything else by mouth or lie down for the next 30 min.  . Insulin Isophane & Regular Human (HUMULIN 70/30 KWIKPEN) (70-30) 100 UNIT/ML PEN Take 4 U SQ with breakfast and 2 U SQ with supper.  . metFORMIN (GLUCOPHAGE) 500 MG tablet TAKE 1 TABLET BY MOUTH TWICE DAILY WITH A MEAL  . metoprolol tartrate (LOPRESSOR) 25 MG tablet TAKE 1 TABLET BY MOUTH IN THE MORNING AND 2 AT BEDTIME (Patient taking differently: Take 25-50 mg by mouth See admin instructions. Take one tablet in the morning, then take 2 tablets in the evening per daughter)  . ONE TOUCH ULTRA TEST test strip USE  STRIP TO CHECK GLUCOSE THREE TIMES DAILY  . ONETOUCH DELICA LANCETS 10G MISC USE 1  TO CHECK GLUCOSE THREE TIMES DAILY  . pantoprazole (PROTONIX) 40 MG tablet Take 1 tablet by mouth once daily  . potassium chloride (KLOR-CON) 10 MEQ tablet Take 1 tablet by mouth once daily   No facility-administered encounter medications on file as of 09/25/2019.     Functional Status:  In your present state of health, do you have any difficulty performing the following activities: 08/09/2019 12/14/2018  Hearing? N Y  Vision? N N  Difficulty concentrating or making decisions? Y Y  Comment patient has short term memory loss -  Walking or climbing  stairs? Y Y  Comment gait unsteady -  Dressing or bathing? N Y  Doing errands, shopping? Y Y  Comment Daughter does with patient -  Conservation officer, nature and eating ? N N  Comment daughter and grandaughter fixes food -  Using the Toilet? N Y  In the past six months, have you accidently leaked urine? Y Y  Comment patient wears adult briefs -  Do you have problems with loss of bowel control? N Y  Managing your Medications? [name]  Comment daughter fixes med box -  Managing your Finances? Benay Pike Delores daughter manages -  Runner, broadcasting/film/video? [name]  Comment family  manages housekeeping -  Some recent data might be hidden    Fall/Depression Screening: Fall Risk  08/29/2019 08/09/2019 04/19/2019  Falls in the past year? 1 1 1   Comment - - -  Number falls in past yr: 1 1 1   Comment - - -  Injury with Fall? 0 0 0  Risk Factor Category  - - -  Risk for fall due to : - History of fall(s);Impaired balance/gait;Impaired mobility History of fall(s);Impaired balance/gait;Impaired mobility  Risk for fall due to: Comment - - -  Follow up - Falls evaluation completed;Falls prevention discussed;Education provided Falls evaluation completed;Education provided;Falls prevention discussed  Comment - - -   PHQ 2/9 Scores 08/29/2019 08/09/2019 04/19/2019 12/14/2018 12/13/2018 11/12/2018 08/10/2018  PHQ - 2 Score 1 3 3 1 3 6 5   PHQ- 9 Score 9 9 8  - 19 22 16    THN CM Care Plan Problem One     Most Recent Value  Care Plan Problem One  Knowledge Deficit in Self Management of COPD  Role Documenting the Problem One  [place] for Problem One  Active  THN Long Term Goal   Patient wil not have any admisssion for COPD exacerbation within the next 90 days  THN Long Term Goal Start Date  09/25/19  Interventions for Problem One Long Term Goal  RN reiterated taking medications as prescribed. RN will follow up with fuether discussion  THN CM Short Term Goal #1   Patient will not have any falls within the next 30 days  Interventions for Short Term Goal #1  RN reiterated fall prevention. RN will cntinue to follow progress with Physical therapy and strengthening.  THN CM Short Term Goal #2   Patient and daughter will follow up on health mainteneacne care within the next 30 days  Interventions for Short Term Goal #2  Patient daughter is trying to get everything set up for patient. RN will follow up on progress      Assessment:  Patient is getting Physical therapy No falls in last 30 days Patient will benefit from [place] telephonic outreach for education and  support for COPD self management.  Plan:  RN discussed fall prevention RN discussed COPD exacerbation RN will follow up within the month of March  Janice Seales [facility] [phone]

## 2019-09-26 DIAGNOSIS — D649 Anemia, unspecified: Secondary | ICD-10-CM | POA: Diagnosis not present

## 2019-09-26 DIAGNOSIS — I2510 Atherosclerotic heart disease of native coronary artery without angina pectoris: Secondary | ICD-10-CM | POA: Diagnosis not present

## 2019-09-26 DIAGNOSIS — E1142 Type 2 diabetes mellitus with diabetic polyneuropathy: Secondary | ICD-10-CM | POA: Diagnosis not present

## 2019-09-26 DIAGNOSIS — J449 Chronic obstructive pulmonary disease, unspecified: Secondary | ICD-10-CM | POA: Diagnosis not present

## 2019-09-26 DIAGNOSIS — N189 Chronic kidney disease, unspecified: Secondary | ICD-10-CM | POA: Diagnosis not present

## 2019-09-26 DIAGNOSIS — R269 Unspecified abnormalities of gait and mobility: Secondary | ICD-10-CM | POA: Diagnosis not present

## 2019-09-26 DIAGNOSIS — Z9181 History of falling: Secondary | ICD-10-CM | POA: Diagnosis not present

## 2019-09-26 DIAGNOSIS — E785 Hyperlipidemia, unspecified: Secondary | ICD-10-CM | POA: Diagnosis not present

## 2019-09-26 DIAGNOSIS — I10 Essential (primary) hypertension: Secondary | ICD-10-CM | POA: Diagnosis not present

## 2019-09-26 DIAGNOSIS — M6281 Muscle weakness (generalized): Secondary | ICD-10-CM | POA: Diagnosis not present

## 2019-09-27 ENCOUNTER — Telehealth: Payer: Self-pay

## 2019-09-27 NOTE — Telephone Encounter (Signed)
Received revision for plan of care, PCP will review and sign, if appropriate.

## 2019-09-30 DIAGNOSIS — N189 Chronic kidney disease, unspecified: Secondary | ICD-10-CM | POA: Diagnosis not present

## 2019-09-30 DIAGNOSIS — I2510 Atherosclerotic heart disease of native coronary artery without angina pectoris: Secondary | ICD-10-CM | POA: Diagnosis not present

## 2019-09-30 DIAGNOSIS — I10 Essential (primary) hypertension: Secondary | ICD-10-CM | POA: Diagnosis not present

## 2019-09-30 DIAGNOSIS — D649 Anemia, unspecified: Secondary | ICD-10-CM | POA: Diagnosis not present

## 2019-09-30 DIAGNOSIS — E1142 Type 2 diabetes mellitus with diabetic polyneuropathy: Secondary | ICD-10-CM | POA: Diagnosis not present

## 2019-09-30 DIAGNOSIS — E785 Hyperlipidemia, unspecified: Secondary | ICD-10-CM | POA: Diagnosis not present

## 2019-09-30 DIAGNOSIS — R269 Unspecified abnormalities of gait and mobility: Secondary | ICD-10-CM | POA: Diagnosis not present

## 2019-09-30 DIAGNOSIS — Z9181 History of falling: Secondary | ICD-10-CM | POA: Diagnosis not present

## 2019-09-30 DIAGNOSIS — M6281 Muscle weakness (generalized): Secondary | ICD-10-CM | POA: Diagnosis not present

## 2019-09-30 DIAGNOSIS — J449 Chronic obstructive pulmonary disease, unspecified: Secondary | ICD-10-CM | POA: Diagnosis not present

## 2019-10-01 DIAGNOSIS — I2510 Atherosclerotic heart disease of native coronary artery without angina pectoris: Secondary | ICD-10-CM | POA: Diagnosis not present

## 2019-10-01 DIAGNOSIS — M6281 Muscle weakness (generalized): Secondary | ICD-10-CM | POA: Diagnosis not present

## 2019-10-01 DIAGNOSIS — N189 Chronic kidney disease, unspecified: Secondary | ICD-10-CM | POA: Diagnosis not present

## 2019-10-01 DIAGNOSIS — R269 Unspecified abnormalities of gait and mobility: Secondary | ICD-10-CM | POA: Diagnosis not present

## 2019-10-01 DIAGNOSIS — E785 Hyperlipidemia, unspecified: Secondary | ICD-10-CM | POA: Diagnosis not present

## 2019-10-01 DIAGNOSIS — J449 Chronic obstructive pulmonary disease, unspecified: Secondary | ICD-10-CM | POA: Diagnosis not present

## 2019-10-01 DIAGNOSIS — I10 Essential (primary) hypertension: Secondary | ICD-10-CM | POA: Diagnosis not present

## 2019-10-01 DIAGNOSIS — Z9181 History of falling: Secondary | ICD-10-CM | POA: Diagnosis not present

## 2019-10-01 DIAGNOSIS — D649 Anemia, unspecified: Secondary | ICD-10-CM | POA: Diagnosis not present

## 2019-10-01 DIAGNOSIS — E1142 Type 2 diabetes mellitus with diabetic polyneuropathy: Secondary | ICD-10-CM | POA: Diagnosis not present

## 2019-10-02 ENCOUNTER — Other Ambulatory Visit: Payer: Self-pay

## 2019-10-02 ENCOUNTER — Telehealth: Payer: Self-pay | Admitting: Family Medicine

## 2019-10-02 ENCOUNTER — Emergency Department (HOSPITAL_COMMUNITY)
Admission: EM | Admit: 2019-10-02 | Discharge: 2019-10-02 | Disposition: A | Discharge status: Home / Self Care | Payer: Medicare Other | Attending: Emergency Medicine | Admitting: Emergency Medicine

## 2019-10-02 ENCOUNTER — Encounter (HOSPITAL_COMMUNITY): Payer: Self-pay

## 2019-10-02 DIAGNOSIS — W19XXXA Unspecified fall, initial encounter: Secondary | ICD-10-CM | POA: Diagnosis not present

## 2019-10-02 DIAGNOSIS — Y929 Unspecified place or not applicable: Secondary | ICD-10-CM | POA: Diagnosis not present

## 2019-10-02 DIAGNOSIS — Y998 Other external cause status: Secondary | ICD-10-CM | POA: Diagnosis not present

## 2019-10-02 DIAGNOSIS — Z043 Encounter for examination and observation following other accident: Secondary | ICD-10-CM | POA: Insufficient documentation

## 2019-10-02 DIAGNOSIS — G44319 Acute post-traumatic headache, not intractable: Secondary | ICD-10-CM

## 2019-10-02 DIAGNOSIS — Y9389 Activity, other specified: Secondary | ICD-10-CM | POA: Diagnosis not present

## 2019-10-02 DIAGNOSIS — Z5321 Procedure and treatment not carried out due to patient leaving prior to being seen by health care provider: Secondary | ICD-10-CM | POA: Insufficient documentation

## 2019-10-02 NOTE — Telephone Encounter (Signed)
FYI  Daughter called reports patient has fallen about 5 - 6 times over the last week. She is not able to get up on her own.   Patient daughter was advised to take patient to Emergency Room or call 911 for ambulance assistance. She understood.

## 2019-10-02 NOTE — ED Triage Notes (Signed)
Pt brought in by son. Per pt, son is legal guardian. Pt states that she has had multiple falls. Pt denies any dizziness or injury.  6 falls in less than a week per son. Pt has hit head a few times as well.

## 2019-10-02 NOTE — Telephone Encounter (Signed)
Noted agree

## 2019-10-03 ENCOUNTER — Encounter: Payer: Self-pay | Admitting: Family Medicine

## 2019-10-03 ENCOUNTER — Telehealth: Payer: Self-pay | Admitting: Family Medicine

## 2019-10-03 ENCOUNTER — Ambulatory Visit (INDEPENDENT_AMBULATORY_CARE_PROVIDER_SITE_OTHER): Payer: Medicare Other | Admitting: Family Medicine

## 2019-10-03 VITALS — Wt 140.6 lb

## 2019-10-03 DIAGNOSIS — F0390 Unspecified dementia without behavioral disturbance: Secondary | ICD-10-CM

## 2019-10-03 DIAGNOSIS — E1142 Type 2 diabetes mellitus with diabetic polyneuropathy: Secondary | ICD-10-CM | POA: Diagnosis not present

## 2019-10-03 DIAGNOSIS — M6281 Muscle weakness (generalized): Secondary | ICD-10-CM | POA: Diagnosis not present

## 2019-10-03 DIAGNOSIS — R296 Repeated falls: Secondary | ICD-10-CM

## 2019-10-03 DIAGNOSIS — Z9181 History of falling: Secondary | ICD-10-CM | POA: Diagnosis not present

## 2019-10-03 DIAGNOSIS — I10 Essential (primary) hypertension: Secondary | ICD-10-CM | POA: Diagnosis not present

## 2019-10-03 DIAGNOSIS — D649 Anemia, unspecified: Secondary | ICD-10-CM | POA: Diagnosis not present

## 2019-10-03 DIAGNOSIS — R269 Unspecified abnormalities of gait and mobility: Secondary | ICD-10-CM | POA: Diagnosis not present

## 2019-10-03 DIAGNOSIS — I2510 Atherosclerotic heart disease of native coronary artery without angina pectoris: Secondary | ICD-10-CM | POA: Diagnosis not present

## 2019-10-03 DIAGNOSIS — R2681 Unsteadiness on feet: Secondary | ICD-10-CM

## 2019-10-03 DIAGNOSIS — E118 Type 2 diabetes mellitus with unspecified complications: Secondary | ICD-10-CM | POA: Diagnosis not present

## 2019-10-03 DIAGNOSIS — E785 Hyperlipidemia, unspecified: Secondary | ICD-10-CM | POA: Diagnosis not present

## 2019-10-03 DIAGNOSIS — G44319 Acute post-traumatic headache, not intractable: Secondary | ICD-10-CM | POA: Diagnosis not present

## 2019-10-03 DIAGNOSIS — S0093XA Contusion of unspecified part of head, initial encounter: Secondary | ICD-10-CM

## 2019-10-03 DIAGNOSIS — N189 Chronic kidney disease, unspecified: Secondary | ICD-10-CM | POA: Diagnosis not present

## 2019-10-03 DIAGNOSIS — J449 Chronic obstructive pulmonary disease, unspecified: Secondary | ICD-10-CM | POA: Diagnosis not present

## 2019-10-03 NOTE — Telephone Encounter (Signed)
FYI  Please see below

## 2019-10-03 NOTE — Progress Notes (Signed)
Virtual Visit via Video Note  I connected with [name] on 10/03/19 at 10:00 AM EST by a video enabled telemedicine application and verified that I am speaking with the correct person using two identifiers.  Location patient: home Location provider:work or home office Persons participating in the virtual visit: patient, [name]'s son, provider  I discussed the limitations of evaluation and management by telemedicine and the availability of in person appointments. The patient expressed understanding and agreed to proceed.  Telemedicine visit is a necessity given the COVID-19 restrictions in place at the current time.  HPI: 73 y/o WF with multiple chronic medical problems + chronic debilitations being seen today for recent problem with falls. Of note, she went to the ED for this yesterday but left before being seen-->she had waited 14 hours. Bruises "all over", some bumps on head. She gets up in night to go to bathroom, trips over things a lot and falls.  Denies dizziness prior to or after falls.  Has HA all over since last fall, has 2 bumps on back of head on left.  Says she otherwise feels well between her falls.  She denies LOC. No home bp checks are being done.  No vision or hearing c/o that are new.  No nausea.  She is eating/drinking well.  Baseline level of cognition/interaction/personality.  Of note, last A1c was down to 5.5% so I decreased her 70/30 insulin to 4 U qAM and 2 U qPM. NO low glucoses since this dose change.  Staying steady in 100s.   ROS: See pertinent positives and negatives per HPI.  Past Medical History:  Diagnosis Date  . Age-related nuclear cataract of both eyes 2016   +cortical age related cataracts OU   . Anxiety   . Arthritis    hands  . Bilateral diabetic retinopathy (HCC) 2015   Dr. Zigmund Daniel  . Blood transfusion without reported diagnosis    when taking chemo  . Chronic renal insufficiency, stage 3 (moderate)    GFR 50s  . 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  Marland Kitchen COPD (chronic obstructive pulmonary disease) ([place])   . CVA (cerebral vascular accident) ([place]) 09/2016   MRI did show a left-sided small ischemic stroke which was acute but likely incidental (MRI was done b/c [name] had TIA sx's in hospital).  Neuro put her on plavix at that time.  Cardiology d/c'd her plavix 08/2017.  . Depression   . Diabetes mellitus with complication (HCC)    diab retinopathy OU (laser)  . GERD (gastroesophageal reflux disease)    protonix  . History of concussion 08/25/2018   w/out loss of consciousness--mild increase in baseline memory impairment after.  CT head neg acute.  Marland Kitchen History of diverticulitis of colon    with abscess; required IR percutaneous drain placed 09/2016.  Marland Kitchen Hyperlipidemia   . Hypertension   . Iron deficiency anemia    started iron 07/02/19.  EGD/colonoscopy 08/27/19 unrevealing. Capsule endoscopy to be done.  . Left bundle branch block (LBBB) 12/16/2016  . Lung cancer ([place])    non-small cell lung ca, stage III in 05/2011; systemic chemotherapy concurrent with radiation followed by prophylactic cranial irradiation and has been observation since July of 2010 with no evidence for disease recurrence-released from onc f/u 12/2014 (needs annual cxr by PCP).  CXR 08/2015 stable.  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 Earlie Server did bx and this was NEG for malignancy.  . Mild cognitive impairment with memory loss  Likely from brain radiation therapy  . Non-obstructive CAD (coronary artery disease)    a. Cath 2006 preserved LV fxn, scattered irregularities without critical stenosis; b. 2008 stress echo negative for ischemia, but with hypertensive response; c. 08/2016 Cath: D1 25%, otw nl.  . Normocytic anemia 2018   Iron, vit B12, folate all normal 12/2016.  Marland Kitchen Open-angle glaucoma 2016   Dr. Shirley Muscat, (bilateral)---responding to topical therapy  . Osteoporosis 03/2016; 03/2018   03/2016 DEXA T-score -2.2.  03/2018 T-score  L femoral neck  -2.7--boniva started.  . Pneumonia    hospitalization 11/2016; hypoxemic resp failure--d/c'd home with home oxygen therapy  . Seasonal allergic rhinitis   . Subclinical hyperthyroidism 2018  . Takotsubo cardiomyopathy    a. 08/2016 Echo: EF 30-35%, gr1DD, PASP 90mHg;  b. 08/2016 Cath: nonobs Dzs;  c. 11/2016 Echo: EF 40-45%, no rwma, nl RV fxn, mild TR, PASP 325mg.  ECHO 02/2017 EF 50-55%, grd II DD---essentially resolved Takotsubo 02/2017.  . 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 08/2016 in setting of diverticulitis & pneumoperitoneum and Takotsubo CM -->prolonged QT, seen by EP-->avoid meds with potential for QT prolongation.    Past Surgical History:  Procedure Laterality Date  . ABDOMINAL HYSTERECTOMY  1997  . APPENDECTOMY    . CARDIAC CATHETERIZATION N/A 09/14/2016   Procedure: Left Heart Cath and Coronary Angiography;  Surgeon: ThTroy SineMD;  [facility];  Service: Cardiovascular; Mild nonobstructive CAD with 85% smooth narrowing in the first diagonal branch of the LAD; 10% smooth narrowing of the ostial proximal left circumflex coronary artery; and a normal dominant RCA.  . Marland KitchenARPAL TUNNEL RELEASE    . CATARACT EXTRACTION Bilateral   . CHOLECYSTECTOMY  2002  . COLONOSCOPY  10/24/00; 08/27/19   2002 normal.  [employer] hemoccult testing via [facility] 06/18/15 was NEG.  2020->diverticulosis o/w nl.  . dexa  03/2016; 03/2018   03/2018 T-score L femoral neck  -2.7 (worsened compared to osteopenic range 2017.)  . ESOPHAGOGASTRODUODENOSCOPY  08/27/2019   Normal.  . IR GENERIC HISTORICAL  09/19/2016   IR SINUS/FIST TUBE CHK-NON GI 09/19/2016 JaCorrie MckusickDO [facility]  . IR GENERIC HISTORICAL  10/06/2016   IR RADIOLOGIST EVAL & MGMT 10/06/2016 [facility]  . IR GENERIC HISTORICAL  10/26/2016   IR RADIOLOGIST EVAL & MGMT 10/26/2016 [facility]  . IR GENERIC HISTORICAL  11/03/2016   IR RADIOLOGIST EVAL & MGMT 11/03/2016 WeArdis RowanPA-C [facility]  . IR GENERIC HISTORICAL  11/24/2016   IR RADIOLOGIST EVAL & MGMT 11/24/2016 WeArdis RowanPA-C [facility]  . IR GENERIC HISTORICAL  12/05/2016   Fistula smaller/improving.  IR SINUS/FIST TUBE CHK-NON GI 12/05/2016 JoSandi MariscalMD [facility]  . IR GENERIC HISTORICAL  12/22/2016   IR RADIOLOGIST EVAL & MGMT 12/22/2016 MiGreggory KeenMD [facility]  . IR RADIOLOGIST EVAL & MGMT  01/10/2017  . IR RADIOLOGIST EVAL & MGMT  02/09/2017  . IR SINUS/FIST TUBE CHK-NON GI  03/28/2017  . LEFT HEART CATHETERIZATION WITH CORONARY ANGIOGRAM N/A 05/30/2012   Procedure: LEFT HEART CATHETERIZATION WITH CORONARY ANGIOGRAM;  Surgeon: ChBurnell BlanksMD;  [facility];  Service: Cardiovascular: Mild, non-obstructive CAD  . TRANSTHORACIC ECHOCARDIOGRAM  09/14/2016; 11/2016   08/2016: EF 30-35 %, Akinesis of the mid-apicalanteroseptal myocardium.  Grade I DD.  Mild pulm HTN.  Repeat echo 11/2016, EF 40-45%, no rwma, nl RV fxn, mild TR, PASP 3769m.    .Marland Kitchen  TRANSTHORACIC ECHOCARDIOGRAM  03/07/2017   EF 50-55%. Hypokinesis of the distal septum with overall low normal LV,  systolic function; mild diastolic dysfunction with elevated LV  filling pressure; mildly calcified aortic valve with mild AI; small pericardial effusion    Family History  Problem Relation Age of Onset  . Heart failure Mother   . Kidney disease Mother        renal failure  . Diabetes Mother   . Diabetes Father   . Diabetes Brother   . Heart disease Brother   . Heart attack Brother   . Heart attack Brother   . Heart attack Brother   . Pancreatic cancer Brother   . Colon cancer Neg Hx   . Esophageal cancer Neg Hx   . Stomach cancer Neg Hx   . Rectal cancer Neg Hx      Current Outpatient Medications:  .  acetaminophen (TYLENOL) 500 MG tablet, Take 500-1,000 mg by mouth every 6 (six) hours as needed for headache (or pain). , Disp: , Rfl:  .  albuterol (PROVENTIL HFA;VENTOLIN HFA) 108 (90 Base) MCG/ACT  inhaler, INHALE 2 PUFFS BY MOUTH EVERY 6 HOURS AS NEEDED FOR WHEEZING OR SHORTNESS OF BREATH (Patient taking differently: Inhale 2 puffs into the lungs every 6 (six) hours as needed for wheezing or shortness of breath. ), Disp: 18 g, Rfl: 0 .  albuterol (PROVENTIL) (2.5 MG/3ML) 0.083% nebulizer solution, USE 1 VIAL IN NEBULIZER EVERY 4 HOURS AS NEEDED FOR WHEEZING AND FOR SHORTNESS OF BREATH, Disp: 75 mL, Rfl: 0 .  aspirin 81 MG chewable tablet, Chew 81 mg by mouth daily., Disp: , Rfl:  .  atorvastatin (LIPITOR) 40 MG tablet, Take 1 tablet (40 mg total) by mouth daily., Disp: 90 tablet, Rfl: 3 .  buPROPion (WELLBUTRIN XL) 150 MG 24 hr tablet, Take 1 tablet by mouth once daily, Disp: 30 tablet, Rfl: 0 .  cetirizine (ZYRTEC) 10 MG tablet, Take 10 mg by mouth daily., Disp: , Rfl:  .  escitalopram (LEXAPRO) 20 MG tablet, Take 1 tablet by mouth once daily, Disp: 90 tablet, Rfl: 1 .  ferrous sulfate 325 (65 FE) MG EC tablet, Take 325 mg by mouth 2 (two) times daily., Disp: , Rfl:  .  FLOVENT HFA 220 MCG/ACT inhaler, Inhale 2 puffs by mouth twice daily, Disp: 12 g, Rfl: 0 .  furosemide (LASIX) 20 MG tablet, 1 tab po qd AS NEEDED for lower extremity swelling, Disp: 30 tablet, Rfl: 0 .  ibandronate (BONIVA) 150 MG tablet, Take 1 tablet (150 mg total) by mouth every 30 (thirty) days. Take in the morning with a full glass of water, on an empty stomach, and do not take anything else by mouth or lie down for the next 30 min., Disp: 3 tablet, Rfl: 3 .  Insulin Isophane & Regular Human (HUMULIN 70/30 KWIKPEN) (70-30) 100 UNIT/ML PEN, Take 4 U SQ with breakfast and 2 U SQ with supper., Disp: 15 pen, Rfl: 1 .  metFORMIN (GLUCOPHAGE) 500 MG tablet, TAKE 1 TABLET BY MOUTH TWICE DAILY WITH A MEAL, Disp: 180 tablet, Rfl: 0 .  metoprolol tartrate (LOPRESSOR) 25 MG tablet, TAKE 1 TABLET BY MOUTH IN THE MORNING AND 2 AT BEDTIME (Patient taking differently: Take 25-50 mg by mouth See admin instructions. Take one tablet in the  morning, then take 2 tablets in the evening per daughter), Disp: 270 tablet, Rfl: 1 .  ONE TOUCH ULTRA TEST test strip, USE  STRIP TO CHECK GLUCOSE THREE TIMES  DAILY, Disp: 100 each, Rfl: 11 .  ONETOUCH DELICA LANCETS 93G MISC, USE 1  TO CHECK GLUCOSE THREE TIMES DAILY, Disp: 100 each, Rfl: 11 .  pantoprazole (PROTONIX) 40 MG tablet, Take 1 tablet by mouth once daily, Disp: 90 tablet, Rfl: 0 .  potassium chloride (KLOR-CON) 10 MEQ tablet, Take 1 tablet by mouth once daily, Disp: 90 tablet, Rfl: 0  EXAM:  VITALS per patient if applicable: Wt 140 lb 9.6 oz (63.8 kg)   BMI 25.72 kg/m    GENERAL: alert, oriented, appears well and in no acute distress  HEENT: atraumatic, conjunttiva clear, no obvious abnormalities on inspection of external nose and ears  NECK: normal movements of the head and neck  LUNGS: on inspection no signs of respiratory distress, breathing rate appears normal, no obvious gross SOB, gasping or wheezing  CV: no obvious cyanosis  MS: moves all visible extremities without noticeable abnormality  PSYCH/NEURO: pleasant and cooperative, no obvious depression or anxiety, speech and thought processing grossly intact  LABS: none today    Chemistry      Component Value Date/Time   NA 130 (L) 08/29/2019 1620   NA 141 09/19/2018 0918   NA 141 01/15/2015 0848   K 4.5 08/29/2019 1620   K 4.0 01/15/2015 0848   CL 96 08/29/2019 1620   CL 105 02/18/2013 0812   CO2 25 08/29/2019 1620   CO2 25 01/15/2015 0848   BUN 12 08/29/2019 1620   BUN 16 09/19/2018 0918   BUN 9.0 01/15/2015 0848   CREATININE 0.94 08/29/2019 1620   CREATININE 1.01 (H) 08/10/2018 1611   CREATININE 0.7 01/15/2015 0848      Component Value Date/Time   CALCIUM 8.9 08/29/2019 1620   CALCIUM 9.2 01/15/2015 0848   ALKPHOS 63 06/19/2019 1230   ALKPHOS 92 01/15/2015 0848   AST 20 06/19/2019 1230   AST 19 01/15/2015 0848   ALT 16 06/19/2019 1230   ALT 20 01/15/2015 0848   BILITOT 0.6 06/19/2019 1230    BILITOT 0.3 09/19/2018 0918   BILITOT 0.34 01/15/2015 0848     Lab Results  Component Value Date   WBC 8.4 08/29/2019   HGB 10.3 (L) 08/29/2019   HCT 32.3 (L) 08/29/2019   MCV 82.9 08/29/2019   PLT 287.0 08/29/2019   Lab Results  Component Value Date   IRON 40 (L) 08/29/2019   TIBC 297 08/29/2019   FERRITIN 38 08/29/2019   Lab Results  Component Value Date   VITAMINB12 863 08/29/2019    Lab Results  Component Value Date   TSH 1.24 04/20/2017   Lab Results  Component Value Date   CHOL 111 09/19/2018   HDL 34 (L) 09/19/2018   LDLCALC 48 09/19/2018   LDLDIRECT 162.0 07/17/2015   TRIG 147 09/19/2018   CHOLHDL 3.3 09/19/2018   ASSESSMENT AND PLAN:  Discussed the following assessment and plan:  1) Frequent falls: suspect she does have some orthostatic dizziness, but main problem is impaired ambulation +generalized weakness that leads to tripping easily over objects on floor. She has a bedside commode but is not using it.  They'll get it out of their garage and start using it.  Encouraged them to regularly check her bp and HR. Given her head contusions and generalized HA I do think a CT head is reasonble, BUT since she is acting well otherwise I also think it is ok to continue with obs. The family wants to talk it over and get back with me.  2) DM 2: no hypo or hyper glycemia since recent lowering of insulin doses.   -we discussed possible serious and likely etiologies, options for evaluation and workup, limitations of telemedicine visit vs in person visit, treatment, treatment risks and precautions. [name] prefers to treat via telemedicine empirically rather then risking or undertaking an in person visit at this moment. Patient agrees to seek prompt in person care if worsening, new symptoms arise, or if is not improving with treatment.   I discussed the assessment and treatment plan with the patient. The patient was provided an opportunity to ask questions and all were  answered. The patient agreed with the plan and demonstrated an understanding of the instructions.   The patient was advised to call back or seek an in-person evaluation if the symptoms worsen or if the condition fails to improve as anticipated.  Spent 25 min with [name] today, with >50% of this time spent in counseling and care coordination regarding the above problems.  F/u: prn  Signed:  Crissie Sickles, MD           10/03/2019

## 2019-10-03 NOTE — Telephone Encounter (Signed)
SW patient's daughter. She is agreeable for Home Health Aide to assist with bathing/dressing 3 X a week.   [place] [phone] to patient's daughter to set up a person to sit with the patient. Insurance does not cover this service. The cost is $20/hour.

## 2019-10-03 NOTE — Telephone Encounter (Signed)
Thanks very much for all your leg work on this!

## 2019-10-07 ENCOUNTER — Emergency Department (HOSPITAL_COMMUNITY): Payer: Medicare Other

## 2019-10-07 ENCOUNTER — Other Ambulatory Visit: Payer: Self-pay

## 2019-10-07 ENCOUNTER — Inpatient Hospital Stay (HOSPITAL_COMMUNITY)
Admission: EM | Admit: 2019-10-07 | Discharge: 2019-10-15 | DRG: 682 | Disposition: A | Discharge status: Skilled Nursing Facility | Payer: Medicare Other | Attending: Internal Medicine | Admitting: Internal Medicine

## 2019-10-07 ENCOUNTER — Encounter (HOSPITAL_COMMUNITY): Payer: Self-pay | Admitting: Emergency Medicine

## 2019-10-07 DIAGNOSIS — I472 Ventricular tachycardia: Secondary | ICD-10-CM | POA: Diagnosis present

## 2019-10-07 DIAGNOSIS — E739 Lactose intolerance, unspecified: Secondary | ICD-10-CM | POA: Diagnosis present

## 2019-10-07 DIAGNOSIS — Z841 Family history of disorders of kidney and ureter: Secondary | ICD-10-CM

## 2019-10-07 DIAGNOSIS — W19XXXA Unspecified fall, initial encounter: Secondary | ICD-10-CM | POA: Diagnosis present

## 2019-10-07 DIAGNOSIS — D509 Iron deficiency anemia, unspecified: Secondary | ICD-10-CM | POA: Diagnosis present

## 2019-10-07 DIAGNOSIS — I447 Left bundle-branch block, unspecified: Secondary | ICD-10-CM | POA: Diagnosis not present

## 2019-10-07 DIAGNOSIS — E1142 Type 2 diabetes mellitus with diabetic polyneuropathy: Secondary | ICD-10-CM | POA: Diagnosis not present

## 2019-10-07 DIAGNOSIS — E875 Hyperkalemia: Secondary | ICD-10-CM | POA: Diagnosis not present

## 2019-10-07 DIAGNOSIS — Z886 Allergy status to analgesic agent status: Secondary | ICD-10-CM

## 2019-10-07 DIAGNOSIS — E860 Dehydration: Secondary | ICD-10-CM | POA: Diagnosis present

## 2019-10-07 DIAGNOSIS — I2510 Atherosclerotic heart disease of native coronary artery without angina pectoris: Secondary | ICD-10-CM | POA: Diagnosis present

## 2019-10-07 DIAGNOSIS — Z8701 Personal history of pneumonia (recurrent): Secondary | ICD-10-CM

## 2019-10-07 DIAGNOSIS — F419 Anxiety disorder, unspecified: Secondary | ICD-10-CM | POA: Diagnosis present

## 2019-10-07 DIAGNOSIS — Z923 Personal history of irradiation: Secondary | ICD-10-CM

## 2019-10-07 DIAGNOSIS — R269 Unspecified abnormalities of gait and mobility: Secondary | ICD-10-CM | POA: Diagnosis not present

## 2019-10-07 DIAGNOSIS — E871 Hypo-osmolality and hyponatremia: Secondary | ICD-10-CM | POA: Diagnosis not present

## 2019-10-07 DIAGNOSIS — Z23 Encounter for immunization: Secondary | ICD-10-CM | POA: Diagnosis not present

## 2019-10-07 DIAGNOSIS — M25551 Pain in right hip: Secondary | ICD-10-CM | POA: Diagnosis not present

## 2019-10-07 DIAGNOSIS — I959 Hypotension, unspecified: Secondary | ICD-10-CM | POA: Diagnosis not present

## 2019-10-07 DIAGNOSIS — Z833 Family history of diabetes mellitus: Secondary | ICD-10-CM

## 2019-10-07 DIAGNOSIS — S0003XA Contusion of scalp, initial encounter: Secondary | ICD-10-CM

## 2019-10-07 DIAGNOSIS — E11319 Type 2 diabetes mellitus with unspecified diabetic retinopathy without macular edema: Secondary | ICD-10-CM | POA: Diagnosis present

## 2019-10-07 DIAGNOSIS — R531 Weakness: Secondary | ICD-10-CM

## 2019-10-07 DIAGNOSIS — J449 Chronic obstructive pulmonary disease, unspecified: Secondary | ICD-10-CM | POA: Diagnosis present

## 2019-10-07 DIAGNOSIS — R296 Repeated falls: Secondary | ICD-10-CM | POA: Diagnosis present

## 2019-10-07 DIAGNOSIS — I130 Hypertensive heart and chronic kidney disease with heart failure and stage 1 through stage 4 chronic kidney disease, or unspecified chronic kidney disease: Secondary | ICD-10-CM | POA: Diagnosis present

## 2019-10-07 DIAGNOSIS — I10 Essential (primary) hypertension: Secondary | ICD-10-CM | POA: Diagnosis not present

## 2019-10-07 DIAGNOSIS — Z743 Need for continuous supervision: Secondary | ICD-10-CM | POA: Diagnosis not present

## 2019-10-07 DIAGNOSIS — Z87891 Personal history of nicotine dependence: Secondary | ICD-10-CM

## 2019-10-07 DIAGNOSIS — Z8673 Personal history of transient ischemic attack (TIA), and cerebral infarction without residual deficits: Secondary | ICD-10-CM

## 2019-10-07 DIAGNOSIS — F329 Major depressive disorder, single episode, unspecified: Secondary | ICD-10-CM | POA: Diagnosis present

## 2019-10-07 DIAGNOSIS — U071 COVID-19: Secondary | ICD-10-CM | POA: Diagnosis not present

## 2019-10-07 DIAGNOSIS — Z9181 History of falling: Secondary | ICD-10-CM | POA: Diagnosis not present

## 2019-10-07 DIAGNOSIS — N1831 Chronic kidney disease, stage 3a: Secondary | ICD-10-CM | POA: Diagnosis present

## 2019-10-07 DIAGNOSIS — K219 Gastro-esophageal reflux disease without esophagitis: Secondary | ICD-10-CM | POA: Diagnosis present

## 2019-10-07 DIAGNOSIS — D638 Anemia in other chronic diseases classified elsewhere: Secondary | ICD-10-CM | POA: Diagnosis present

## 2019-10-07 DIAGNOSIS — N179 Acute kidney failure, unspecified: Secondary | ICD-10-CM | POA: Diagnosis not present

## 2019-10-07 DIAGNOSIS — M810 Age-related osteoporosis without current pathological fracture: Secondary | ICD-10-CM | POA: Diagnosis not present

## 2019-10-07 DIAGNOSIS — E1165 Type 2 diabetes mellitus with hyperglycemia: Secondary | ICD-10-CM | POA: Diagnosis present

## 2019-10-07 DIAGNOSIS — N189 Chronic kidney disease, unspecified: Secondary | ICD-10-CM | POA: Diagnosis not present

## 2019-10-07 DIAGNOSIS — R5381 Other malaise: Secondary | ICD-10-CM | POA: Diagnosis not present

## 2019-10-07 DIAGNOSIS — Z7982 Long term (current) use of aspirin: Secondary | ICD-10-CM

## 2019-10-07 DIAGNOSIS — T380X5A Adverse effect of glucocorticoids and synthetic analogues, initial encounter: Secondary | ICD-10-CM | POA: Diagnosis present

## 2019-10-07 DIAGNOSIS — Z8249 Family history of ischemic heart disease and other diseases of the circulatory system: Secondary | ICD-10-CM

## 2019-10-07 DIAGNOSIS — Z800 Family history of malignant neoplasm of digestive organs: Secondary | ICD-10-CM

## 2019-10-07 DIAGNOSIS — E876 Hypokalemia: Secondary | ICD-10-CM | POA: Diagnosis not present

## 2019-10-07 DIAGNOSIS — M1990 Unspecified osteoarthritis, unspecified site: Secondary | ICD-10-CM | POA: Diagnosis present

## 2019-10-07 DIAGNOSIS — E1122 Type 2 diabetes mellitus with diabetic chronic kidney disease: Secondary | ICD-10-CM | POA: Diagnosis present

## 2019-10-07 DIAGNOSIS — E785 Hyperlipidemia, unspecified: Secondary | ICD-10-CM | POA: Diagnosis present

## 2019-10-07 DIAGNOSIS — I5032 Chronic diastolic (congestive) heart failure: Secondary | ICD-10-CM | POA: Diagnosis present

## 2019-10-07 DIAGNOSIS — D649 Anemia, unspecified: Secondary | ICD-10-CM | POA: Diagnosis not present

## 2019-10-07 DIAGNOSIS — Z85118 Personal history of other malignant neoplasm of bronchus and lung: Secondary | ICD-10-CM

## 2019-10-07 DIAGNOSIS — R8271 Bacteriuria: Secondary | ICD-10-CM | POA: Diagnosis not present

## 2019-10-07 DIAGNOSIS — G3184 Mild cognitive impairment, so stated: Secondary | ICD-10-CM | POA: Diagnosis present

## 2019-10-07 DIAGNOSIS — E872 Acidosis: Secondary | ICD-10-CM | POA: Diagnosis not present

## 2019-10-07 DIAGNOSIS — R8281 Pyuria: Secondary | ICD-10-CM | POA: Diagnosis not present

## 2019-10-07 DIAGNOSIS — Z794 Long term (current) use of insulin: Secondary | ICD-10-CM

## 2019-10-07 DIAGNOSIS — H2513 Age-related nuclear cataract, bilateral: Secondary | ICD-10-CM | POA: Diagnosis present

## 2019-10-07 DIAGNOSIS — T43224A Poisoning by selective serotonin reuptake inhibitors, undetermined, initial encounter: Secondary | ICD-10-CM | POA: Diagnosis present

## 2019-10-07 DIAGNOSIS — M6281 Muscle weakness (generalized): Secondary | ICD-10-CM | POA: Diagnosis not present

## 2019-10-07 DIAGNOSIS — R0682 Tachypnea, not elsewhere classified: Secondary | ICD-10-CM | POA: Diagnosis present

## 2019-10-07 DIAGNOSIS — Z79899 Other long term (current) drug therapy: Secondary | ICD-10-CM

## 2019-10-07 LAB — BASIC METABOLIC PANEL
Anion gap: 11 (ref 5–15)
BUN: 25 mg/dL — ABNORMAL HIGH (ref 8–23)
CO2: 24 mmol/L (ref 22–32)
Calcium: 8.9 mg/dL (ref 8.9–10.3)
Chloride: 95 mmol/L — ABNORMAL LOW (ref 98–111)
Creatinine, Ser: 1.27 mg/dL — ABNORMAL HIGH (ref 0.44–1.00)
GFR calc Af Amer: 49 mL/min — ABNORMAL LOW (ref >60)
GFR calc non Af Amer: 42 mL/min — ABNORMAL LOW (ref >60)
Glucose, Bld: 117 mg/dL — ABNORMAL HIGH (ref 70–99)
Potassium: 5.3 mmol/L — ABNORMAL HIGH (ref 3.5–5.1)
Sodium: 130 mmol/L — ABNORMAL LOW (ref 135–145)

## 2019-10-07 LAB — HEPATIC FUNCTION PANEL
ALT: 28 U/L (ref 0–44)
AST: 64 U/L — ABNORMAL HIGH (ref 15–41)
Albumin: 3.7 g/dL (ref 3.5–5.0)
Alkaline Phosphatase: 70 U/L (ref 38–126)
Bilirubin, Direct: 0.5 mg/dL — ABNORMAL HIGH (ref 0.0–0.2)
Indirect Bilirubin: 0.9 mg/dL (ref 0.3–0.9)
Total Bilirubin: 1.4 mg/dL — ABNORMAL HIGH (ref 0.3–1.2)
Total Protein: 7.7 g/dL (ref 6.5–8.1)

## 2019-10-07 LAB — CBC
HCT: 35.5 % — ABNORMAL LOW (ref 36.0–46.0)
Hemoglobin: 10.9 g/dL — ABNORMAL LOW (ref 12.0–15.0)
MCH: 26.9 pg (ref 26.0–34.0)
MCHC: 30.7 g/dL (ref 30.0–36.0)
MCV: 87.7 fL (ref 80.0–100.0)
Platelets: 235 10*3/uL (ref 150–400)
RBC: 4.05 MIL/uL (ref 3.87–5.11)
RDW: 17.8 % — ABNORMAL HIGH (ref 11.5–15.5)
WBC: 9.9 10*3/uL (ref 4.0–10.5)
nRBC: 0 % (ref 0.0–0.2)

## 2019-10-07 MED ORDER — SODIUM CHLORIDE 0.9 % IV BOLUS
500.0000 mL | Freq: Once | INTRAVENOUS | Status: AC
  Administered 2019-10-07: 500 mL via INTRAVENOUS

## 2019-10-07 MED ORDER — SODIUM CHLORIDE 0.9% FLUSH: 3.0000 mL | Freq: Once | INTRAVENOUS | Status: DC

## 2019-10-07 NOTE — Telephone Encounter (Signed)
Please advise, thanks.

## 2019-10-07 NOTE — ED Notes (Signed)
Spoke with Daughter [name]:  Falling - not today, last night before went to bed, 5-6 in the last week. Head first in bathtub on Thursday - memory and mobility gone down since the.   Cough  - Lung CA since 2010. Uses Neb and Inhaler.

## 2019-10-07 NOTE — Telephone Encounter (Signed)
OK, CT head ordered for [place] [facility]

## 2019-10-07 NOTE — ED Provider Notes (Signed)
[facility] DEPT Provider Note   CSN: [id number] Arrival date & time: 10/07/19  1827     History No chief complaint on file.   [name] is a 73 y.o. female.  The history is provided by the patient. No language interpreter was used.  Weakness Severity:  Moderate Onset quality:  Gradual Timing:  Constant Progression:  Worsening Chronicity:  New Relieved by:  Nothing Worsened by:  Nothing Ineffective treatments:  None tried Associated symptoms: no myalgias and no nausea   Risk factors: no anemia   Pt fell last night and hit her head.  Pt has had multiple falls over the last week. Pt's daughter reports pt has had increased weakness over the past week      Past Medical History:  Diagnosis Date  . Age-related nuclear cataract of both eyes 2016   +cortical age related cataracts OU   . Anxiety   . Arthritis    hands  . Bilateral diabetic retinopathy (HCC) 2015   Dr. Zigmund Daniel  . Blood transfusion without reported diagnosis    when taking chemo  . Chronic renal insufficiency, stage 3 (moderate)    GFR 50s  . 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  Marland Kitchen COPD (chronic obstructive pulmonary disease) ([place])   . CVA (cerebral vascular accident) ([place]) 09/2016   MRI did show a left-sided small ischemic stroke which was acute but likely incidental (MRI was done b/c pt had TIA sx's in hospital).  Neuro put her on plavix at that time.  Cardiology d/c'd her plavix 08/2017.  . Depression   . Diabetes mellitus with complication (HCC)    diab retinopathy OU (laser)  . GERD (gastroesophageal reflux disease)    protonix  . History of concussion 08/25/2018   w/out loss of consciousness--mild increase in baseline memory impairment after.  CT head neg acute.  Marland Kitchen History of diverticulitis of colon    with abscess; required IR percutaneous drain placed 09/2016.  Marland Kitchen Hyperlipidemia   . Hypertension   . Iron  deficiency anemia    started iron 07/02/19.  EGD/colonoscopy 08/27/19 unrevealing. Capsule endoscopy to be done.  . Left bundle branch block (LBBB) 12/16/2016  . Lung cancer ([place])    non-small cell lung ca, stage III in 05/2011; systemic chemotherapy concurrent with radiation followed by prophylactic cranial irradiation and has been observation since July of 2010 with no evidence for disease recurrence-released from onc f/u 12/2014 (needs annual cxr by PCP).  CXR 08/2015 stable.  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 Earlie Server did bx and this was NEG for malignancy.  . Mild cognitive impairment with memory loss    Likely from brain radiation therapy  . Non-obstructive CAD (coronary artery disease)    a. Cath 2006 preserved LV fxn, scattered irregularities without critical stenosis; b. 2008 stress echo negative for ischemia, but with hypertensive response; c. 08/2016 Cath: D1 25%, otw nl.  . Normocytic anemia 2018   Iron, vit B12, folate all normal 12/2016.  Marland Kitchen Open-angle glaucoma 2016   Dr. Shirley Muscat, (bilateral)---responding to topical therapy  . Osteoporosis 03/2016; 03/2018   03/2016 DEXA T-score -2.2.  03/2018 T-score L femoral neck  -2.7--boniva started.  . Pneumonia    hospitalization 11/2016; hypoxemic resp failure--d/c'd home with home oxygen therapy  . Seasonal allergic rhinitis   . Subclinical hyperthyroidism 2018  . Takotsubo cardiomyopathy    a. 08/2016 Echo: EF 30-35%, gr1DD, PASP 62mHg;  b. 08/2016 Cath: nonobs Dzs;  c. 11/2016 Echo: EF 40-45%, no rwma, nl RV fxn, mild TR, PASP 66mHg.  ECHO 02/2017 EF 50-55%, grd II DD---essentially resolved Takotsubo 02/2017.  . Torsades de pointes ([place] 08/2016   a. 08/2016 in setting of diverticulitis & pneumoperitoneum and Takotsubo CM -->prolonged QT, seen by EP-->avoid meds with potential for QT prolongation.    Patient Active Problem List   Diagnosis Date Noted  . Preoperative cardiovascular examination 01/12/2017  . Diarrhea 12/02/2016  . CAP  (community acquired pneumonia) 12/01/2016  . Essential hypertension 12/01/2016  . Chronic diastolic [place]congestive) heart failure ([place] 12/01/2016  . Cerebrovascular accident (CVA) ([place]   . 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 Cardiac arrest with ventricular fibrillation ([place] 09/14/2016  . Takotsubo cardiomyopathy 09/14/2016  . Colonic diverticular abscess   . Diverticulitis of large intestine with perforation and abscess 09/11/2016  . Controlled type 2 diabetes mellitus with hyperglycemia, with long-term current use of insulin ([place] 06/14/2016  . Hyperlipidemia 06/14/2016  . COPD exacerbation ([place] 10/18/2013  . Non-occlusive coronary artery disease 06/06/2012  . Allergic rhinitis 01/06/2011  . TOBACCO USE, QUIT 07/29/2009  . Uncontrolled type 2 diabetes mellitus with complication ([place] 063/84/6659 . GERD (gastroesophageal reflux disease) 04/24/2007    Past Surgical History:  Procedure Laterality Date  . ABDOMINAL HYSTERECTOMY  1997  . APPENDECTOMY    . CARDIAC CATHETERIZATION N/A 09/14/2016   Procedure: Left Heart Cath and Coronary Angiography;  Surgeon: TTroy Sine MD;  [facility];  Service: Cardiovascular; Mild nonobstructive CAD with 85% smooth narrowing in the first diagonal branch of the LAD; 10% smooth narrowing of the ostial proximal left circumflex coronary artery; and a normal dominant RCA.  .Marland KitchenCARPAL TUNNEL RELEASE    . CATARACT EXTRACTION Bilateral   . CHOLECYSTECTOMY  2002  . COLONOSCOPY  10/24/00; 08/27/19   2002 normal.  [employer] hemoccult testing via [facility] 06/18/15 was NEG.  2020->diverticulosis o/w nl.  . dexa  03/2016; 03/2018   03/2018 T-score L femoral neck  -2.7 (worsened compared to osteopenic range 2017.)  . ESOPHAGOGASTRODUODENOSCOPY  08/27/2019   Normal.  . IR GENERIC HISTORICAL  09/19/2016   IR SINUS/FIST TUBE CHK-NON GI 09/19/2016 JCorrie Mckusick DO [facility]  . IR  GENERIC HISTORICAL  10/06/2016   IR RADIOLOGIST EVAL & MGMT 10/06/2016 [facility]  . IR GENERIC HISTORICAL  10/26/2016   IR RADIOLOGIST EVAL & MGMT 10/26/2016 [facility]  . IR GENERIC HISTORICAL  11/03/2016   IR RADIOLOGIST EVAL & MGMT 11/03/2016 WArdis Rowan PA-C [facility]  . IR GENERIC HISTORICAL  11/24/2016   IR RADIOLOGIST EVAL & MGMT 11/24/2016 WArdis Rowan PA-C [facility]  . IR GENERIC HISTORICAL  12/05/2016   Fistula smaller/improving.  IR SINUS/FIST TUBE CHK-NON GI 12/05/2016 JSandi Mariscal MD [facility]  . IR GENERIC HISTORICAL  12/22/2016   IR RADIOLOGIST EVAL & MGMT 12/22/2016 MGreggory Keen MD [facility]  . IR RADIOLOGIST EVAL & MGMT  01/10/2017  . IR RADIOLOGIST EVAL & MGMT  02/09/2017  . IR SINUS/FIST TUBE CHK-NON GI  03/28/2017  . LEFT HEART CATHETERIZATION WITH CORONARY ANGIOGRAM N/A 05/30/2012   Procedure: LEFT HEART CATHETERIZATION WITH CORONARY ANGIOGRAM;  Surgeon: CBurnell Blanks MD;  [facility];  Service: Cardiovascular: Mild, non-obstructive CAD  . TRANSTHORACIC ECHOCARDIOGRAM  09/14/2016; 11/2016   08/2016: EF 30-35 %, Akinesis of the mid-apicalanteroseptal myocardium.  Grade I DD.  Mild pulm HTN.  Repeat echo 11/2016, EF 40-45%, no rwma, nl RV fxn, mild TR, PASP 102mHg.    .Marland KitchenTRANSTHORACIC ECHOCARDIOGRAM  03/07/2017   EF 50-55%. Hypokinesis of the distal septum with overall low normal LV,  systolic function; mild diastolic dysfunction with elevated LV  filling pressure; mildly calcified aortic valve with mild AI; small pericardial effusion     OB History   No obstetric history on file.     Family History  Problem Relation Age of Onset  . Heart failure Mother   . Kidney disease Mother        renal failure  . Diabetes Mother   . Diabetes Father   . Diabetes Brother   . Heart disease Brother   . Heart attack Brother   . Heart attack Brother   . Heart attack Brother   . Pancreatic cancer Brother   . Colon  cancer Neg Hx   . Esophageal cancer Neg Hx   . Stomach cancer Neg Hx   . Rectal cancer Neg Hx     Social History   Tobacco Use  . Smoking status: Former Smoker    Packs/day: 1.00    Years: 45.00    Pack years: 45.00    Types: Cigarettes    Quit date: 10/24/2008    Years since quitting: 10.9  . Smokeless tobacco: Never Used  Substance Use Topics  . Alcohol use: No    Alcohol/week: 0.0 standard drinks  . Drug use: No    Home Medications Prior to Admission medications   Medication Sig Start Date End Date Taking? Authorizing Provider  albuterol (PROVENTIL HFA;VENTOLIN HFA) 108 (90 Base) MCG/ACT inhaler INHALE 2 PUFFS BY MOUTH EVERY 6 HOURS AS NEEDED FOR WHEEZING OR SHORTNESS OF BREATH Patient taking differently: Inhale 2 puffs into the lungs every 6 (six) hours as needed for wheezing or shortness of breath.  12/21/18  Yes McGowen, PAdrian Blackwater MD  albuterol (PROVENTIL) (2.5 MG/3ML) 0.083% nebulizer solution USE 1 VIAL IN NEBULIZER EVERY 4 HOURS AS NEEDED FOR WHEEZING AND FOR SHORTNESS OF BREATH Patient taking differently: Take 2.5 mg by nebulization every 4 (four) hours as needed for wheezing or shortness of breath.  09/16/19  Yes McGowen, PAdrian Blackwater MD  aspirin 81 MG EC tablet Take 81 mg by mouth daily.    Yes [provider]  atorvastatin (LIPITOR) 40 MG tablet Take 1 tablet (40 mg total) by mouth daily. 12/13/18  Yes McGowen, PAdrian Blackwater MD  buPROPion (WELLBUTRIN XL) 150 MG 24 hr tablet Take 1 tablet by mouth once daily Patient taking differently: Take 150 mg by mouth daily.  09/02/19  Yes McGowen, PAdrian Blackwater MD  cetirizine (ZYRTEC) 10 MG tablet Take 10 mg by mouth daily.   Yes [provider]  escitalopram (LEXAPRO) 20 MG tablet Take 1 tablet by mouth once daily Patient taking differently: Take 20 mg by mouth daily.  06/10/19  Yes McGowen, PAdrian Blackwater MD  ferrous sulfate 325 (65 FE) MG EC tablet Take 325 mg by mouth 2 (two) times daily.   Yes [provider]  FLOVENT  HFA 220 MCG/ACT inhaler Inhale 2 puffs by mouth twice daily Patient taking differently: Inhale 2 puffs into the lungs 2 (two) times daily.  05/18/19  Yes McGowen, PAdrian Blackwater MD  ibandronate (BONIVA) 150 MG tablet Take 1 tablet (150 mg total) by mouth every 30 (thirty) days. Take in the morning with a full glass of water, on an empty stomach, and do  not take anything else by mouth or lie down for the next 30 min. 05/06/19  Yes McGowen, [name], MD  Insulin Isophane & Regular Human (HUMULIN 70/30 KWIKPEN) (70-30) 100 UNIT/ML PEN Take 4 U SQ with breakfast and 2 U SQ with supper. Patient taking differently: Inject 2-4 Units into the skin See admin instructions. Take 4 U SQ with breakfast and 2 U SQ with supper. 09/02/19  Yes McGowen, [name], MD  metFORMIN (GLUCOPHAGE) 500 MG tablet TAKE 1 TABLET BY MOUTH TWICE DAILY WITH A MEAL Patient taking differently: Take 500 mg by mouth 2 (two) times daily with a meal.  08/05/19  Yes McGowen, [name], MD  metoprolol tartrate (LOPRESSOR) 25 MG tablet TAKE 1 TABLET BY MOUTH IN THE MORNING AND 2 AT BEDTIME Patient taking differently: Take 25-50 mg by mouth See admin instructions. Take one tablet in the morning, then take 2 tablets in the evening per daughter 04/15/19  Yes Leonie Man, MD  ONE TOUCH ULTRA TEST test strip USE  STRIP TO CHECK GLUCOSE THREE TIMES DAILY 05/31/18  Yes McGowen, [name], MD  [facility] DELICA LANCETS 10G MISC USE 1  TO CHECK GLUCOSE THREE TIMES DAILY 05/04/18  Yes McGowen, [name], MD  pantoprazole (PROTONIX) 40 MG tablet Take 1 tablet by mouth once daily Patient taking differently: Take 40 mg by mouth daily.  08/05/19  Yes McGowen, [name], MD  potassium chloride (KLOR-CON) 10 MEQ tablet Take 1 tablet by mouth once daily Patient taking differently: Take 10 mEq by mouth daily.  08/05/19  Yes McGowen, [name], MD    Allergies    Lactose intolerance (gi) and Ibuprofen  Review of Systems   Review of Systems  Gastrointestinal: Negative  for nausea.  Musculoskeletal: Negative for myalgias.  Neurological: Positive for weakness.  All other systems reviewed and are negative.   Physical Exam Updated Vital Signs BP 117/65   Pulse 89   Temp 98.7 F (37.1 C) (Oral)   Resp 18   Ht 5' 4" (1.626 m)   Wt 63 kg   SpO2 94%   BMI 23.84 kg/m   Physical Exam Vitals and nursing note reviewed.  Constitutional:      Appearance: She is well-developed.  HENT:     Head: Normocephalic.     Mouth/Throat:     Mouth: Mucous membranes are moist.  Eyes:     Extraocular Movements: Extraocular movements intact.     Pupils: Pupils are equal, round, and reactive to light.  Cardiovascular:     Rate and Rhythm: Normal rate.  Pulmonary:     Effort: Pulmonary effort is normal.  Abdominal:     General: There is no distension.  Musculoskeletal:        General: Normal range of motion.     Cervical back: Normal range of motion.  Skin:    General: Skin is warm.  Neurological:     General: No focal deficit present.     Mental Status: She is alert.     Cranial Nerves: No cranial nerve deficit.     Motor: No weakness.  Psychiatric:        Mood and Affect: Mood normal.     ED Results / Procedures / Treatments   Labs (all labs ordered are listed, but only abnormal results are displayed) Labs Reviewed  BASIC METABOLIC PANEL - Abnormal; Notable for the following components:      Result Value   Sodium 130 (*)  Potassium 5.3 (*)    Chloride 95 (*)    Glucose, Bld 117 (*)    BUN 25 (*)    Creatinine, Ser 1.27 (*)    GFR calc non Af Amer 42 (*)    GFR calc Af Amer 49 (*)    All other components within normal limits  CBC - Abnormal; Notable for the following components:   Hemoglobin 10.9 (*)    HCT 35.5 (*)    RDW 17.8 (*)    All other components within normal limits  HEPATIC FUNCTION PANEL - Abnormal; Notable for the following components:   AST 64 (*)    Total Bilirubin 1.4 (*)    Bilirubin, Direct 0.5 (*)    All other  components within normal limits  URINALYSIS, ROUTINE W REFLEX MICROSCOPIC    EKG EKG Interpretation  Date/Time:  Monday October 07 2019 19:22:00 EST Ventricular Rate:  88 PR Interval:    QRS Duration: 133 QT Interval:  436 QTC Calculation: 528 R Axis:   3 Text Interpretation: Sinus rhythm Left bundle branch block Confirmed by Dene Gentry [phone]) on 10/07/2019 9:01:49 PM   Radiology CT Head Wo Contrast  Result Date: 10/07/2019 CLINICAL DATA:  Fall.  Generalized weakness EXAM: CT HEAD WITHOUT CONTRAST TECHNIQUE: Contiguous axial images were obtained from the base of the skull through the vertex without intravenous contrast. COMPARISON:  Head CT 08/25/2018 FINDINGS: Brain: No acute intracranial hemorrhage. No focal mass lesion. No CT evidence of acute infarction. No midline shift or mass effect. No hydrocephalus. Basilar cisterns are patent. There are periventricular and subcortical white matter hypodensities. Generalized cortical atrophy. Vascular: No hyperdense vessel or unexpected calcification. Skull: Normal. Negative for fracture or focal lesion. Sinuses/Orbits: Paranasal sinuses and mastoid air cells are clear. Orbits are clear. Other: Small scalp hematomas over the RIGHT parietal bone. IMPRESSION: 1. No acute intracranial findings. 2. No evidence of trauma. 3. Atrophy white matter microvascular disease. 4. Several small scalp hematomas over the RIGHT parietal bone. No skull fracture Electronically Signed   By: Suzy Bouchard M.D.   On: 10/07/2019 20:38   DG Chest Port 1 View  Result Date: 10/07/2019 CLINICAL DATA:  Weakness EXAM: PORTABLE CHEST 1 VIEW COMPARISON:  Eight AT 18 FINDINGS: Stable soft tissue density in the right paratracheal upper lobe, likely radiation changes. Volume loss on the right with elevation of the right hemidiaphragm, stable. Left lung clear. Heart is normal size. No effusions or acute bony abnormality. IMPRESSION: No acute cardiopulmonary disease.  Electronically Signed   By: Rolm Baptise M.D.   On: 10/07/2019 20:45    Procedures Procedures (including critical care time)  Medications Ordered in ED Medications  sodium chloride flush (NS) 0.9 % injection 3 mL (has no administration in time range)    ED Course  I have reviewed the triage vital signs and the nursing notes.  Pertinent labs & imaging results that were available during my care of the patient were reviewed by me and considered in my medical decision making (see chart for details).    MDM Rules/Calculators/A&P                      MDM: Pt has a sodium of 130, potassium 5.3  Bun elevated at 25, creatine 1.27   IV fluids ordered. Ct head. No acute intercranial injury,  I spoke to Dr. Legrand Pitts [facility] who will admit  Final Clinical Impression(s) / ED Diagnoses Final diagnoses:  Weakness  Fall, initial encounter  Contusion of scalp, initial encounter  Hyperkalemia  Acute renal failure, unspecified acute renal failure type [facility])    Rx / DC Orders ED Discharge Orders    None       Sidney Ace 10/07/19 2340    Valarie Merino, MD 10/17/19 1806

## 2019-10-07 NOTE — ED Triage Notes (Signed)
Per EMS, pt complains of generalized weakness since last Thursday. Pt has fallen several times in the past couple weeks. Pt was alert and oriented x 4. Pt has decreased mobility, is unable to walk due to weakness. Pt denies cough, fever, shortness of breath.   CBG 138

## 2019-10-07 NOTE — Telephone Encounter (Signed)
Patient's son called. Him and his sister Delores have decided that they would like for the patient to have the recommended CT at [place]. Thank you

## 2019-10-07 NOTE — ED Triage Notes (Signed)
Pt having active coughing at this time.

## 2019-10-08 ENCOUNTER — Telehealth: Payer: Self-pay

## 2019-10-08 ENCOUNTER — Observation Stay (HOSPITAL_COMMUNITY): Payer: Medicare Other

## 2019-10-08 DIAGNOSIS — F329 Major depressive disorder, single episode, unspecified: Secondary | ICD-10-CM | POA: Diagnosis present

## 2019-10-08 DIAGNOSIS — W19XXXA Unspecified fall, initial encounter: Secondary | ICD-10-CM | POA: Diagnosis not present

## 2019-10-08 DIAGNOSIS — E11319 Type 2 diabetes mellitus with unspecified diabetic retinopathy without macular edema: Secondary | ICD-10-CM | POA: Diagnosis present

## 2019-10-08 DIAGNOSIS — F419 Anxiety disorder, unspecified: Secondary | ICD-10-CM | POA: Diagnosis present

## 2019-10-08 DIAGNOSIS — I5032 Chronic diastolic (congestive) heart failure: Secondary | ICD-10-CM | POA: Diagnosis present

## 2019-10-08 DIAGNOSIS — D649 Anemia, unspecified: Secondary | ICD-10-CM | POA: Diagnosis not present

## 2019-10-08 DIAGNOSIS — R262 Difficulty in walking, not elsewhere classified: Secondary | ICD-10-CM | POA: Diagnosis not present

## 2019-10-08 DIAGNOSIS — E860 Dehydration: Secondary | ICD-10-CM | POA: Diagnosis present

## 2019-10-08 DIAGNOSIS — Z23 Encounter for immunization: Secondary | ICD-10-CM | POA: Diagnosis not present

## 2019-10-08 DIAGNOSIS — E875 Hyperkalemia: Secondary | ICD-10-CM | POA: Diagnosis not present

## 2019-10-08 DIAGNOSIS — M25551 Pain in right hip: Secondary | ICD-10-CM | POA: Diagnosis not present

## 2019-10-08 DIAGNOSIS — J449 Chronic obstructive pulmonary disease, unspecified: Secondary | ICD-10-CM | POA: Diagnosis not present

## 2019-10-08 DIAGNOSIS — I639 Cerebral infarction, unspecified: Secondary | ICD-10-CM | POA: Diagnosis not present

## 2019-10-08 DIAGNOSIS — E872 Acidosis: Secondary | ICD-10-CM | POA: Diagnosis present

## 2019-10-08 DIAGNOSIS — G459 Transient cerebral ischemic attack, unspecified: Secondary | ICD-10-CM | POA: Diagnosis not present

## 2019-10-08 DIAGNOSIS — I2510 Atherosclerotic heart disease of native coronary artery without angina pectoris: Secondary | ICD-10-CM | POA: Diagnosis not present

## 2019-10-08 DIAGNOSIS — N179 Acute kidney failure, unspecified: Secondary | ICD-10-CM | POA: Diagnosis not present

## 2019-10-08 DIAGNOSIS — U071 COVID-19: Secondary | ICD-10-CM | POA: Diagnosis not present

## 2019-10-08 DIAGNOSIS — I10 Essential (primary) hypertension: Secondary | ICD-10-CM | POA: Diagnosis not present

## 2019-10-08 DIAGNOSIS — R296 Repeated falls: Secondary | ICD-10-CM | POA: Diagnosis not present

## 2019-10-08 DIAGNOSIS — D631 Anemia in chronic kidney disease: Secondary | ICD-10-CM | POA: Diagnosis not present

## 2019-10-08 DIAGNOSIS — Z8673 Personal history of transient ischemic attack (TIA), and cerebral infarction without residual deficits: Secondary | ICD-10-CM | POA: Diagnosis not present

## 2019-10-08 DIAGNOSIS — E782 Mixed hyperlipidemia: Secondary | ICD-10-CM | POA: Diagnosis not present

## 2019-10-08 DIAGNOSIS — R531 Weakness: Secondary | ICD-10-CM | POA: Diagnosis not present

## 2019-10-08 DIAGNOSIS — S0003XA Contusion of scalp, initial encounter: Secondary | ICD-10-CM | POA: Diagnosis not present

## 2019-10-08 DIAGNOSIS — R8281 Pyuria: Secondary | ICD-10-CM | POA: Diagnosis not present

## 2019-10-08 DIAGNOSIS — E118 Type 2 diabetes mellitus with unspecified complications: Secondary | ICD-10-CM | POA: Diagnosis not present

## 2019-10-08 DIAGNOSIS — E785 Hyperlipidemia, unspecified: Secondary | ICD-10-CM | POA: Diagnosis present

## 2019-10-08 DIAGNOSIS — M2550 Pain in unspecified joint: Secondary | ICD-10-CM | POA: Diagnosis not present

## 2019-10-08 DIAGNOSIS — M6281 Muscle weakness (generalized): Secondary | ICD-10-CM | POA: Diagnosis not present

## 2019-10-08 DIAGNOSIS — E871 Hypo-osmolality and hyponatremia: Secondary | ICD-10-CM | POA: Diagnosis not present

## 2019-10-08 DIAGNOSIS — Z886 Allergy status to analgesic agent status: Secondary | ICD-10-CM | POA: Diagnosis not present

## 2019-10-08 DIAGNOSIS — K219 Gastro-esophageal reflux disease without esophagitis: Secondary | ICD-10-CM | POA: Diagnosis not present

## 2019-10-08 DIAGNOSIS — I4901 Ventricular fibrillation: Secondary | ICD-10-CM | POA: Diagnosis not present

## 2019-10-08 DIAGNOSIS — M810 Age-related osteoporosis without current pathological fracture: Secondary | ICD-10-CM | POA: Diagnosis not present

## 2019-10-08 DIAGNOSIS — E1165 Type 2 diabetes mellitus with hyperglycemia: Secondary | ICD-10-CM | POA: Diagnosis not present

## 2019-10-08 DIAGNOSIS — Z743 Need for continuous supervision: Secondary | ICD-10-CM | POA: Diagnosis not present

## 2019-10-08 DIAGNOSIS — D638 Anemia in other chronic diseases classified elsewhere: Secondary | ICD-10-CM | POA: Diagnosis present

## 2019-10-08 DIAGNOSIS — I130 Hypertensive heart and chronic kidney disease with heart failure and stage 1 through stage 4 chronic kidney disease, or unspecified chronic kidney disease: Secondary | ICD-10-CM | POA: Diagnosis present

## 2019-10-08 DIAGNOSIS — R8271 Bacteriuria: Secondary | ICD-10-CM | POA: Diagnosis not present

## 2019-10-08 DIAGNOSIS — I472 Ventricular tachycardia: Secondary | ICD-10-CM | POA: Diagnosis present

## 2019-10-08 DIAGNOSIS — E739 Lactose intolerance, unspecified: Secondary | ICD-10-CM | POA: Diagnosis present

## 2019-10-08 DIAGNOSIS — Z7401 Bed confinement status: Secondary | ICD-10-CM | POA: Diagnosis not present

## 2019-10-08 LAB — CBC WITH DIFFERENTIAL/PLATELET
Abs Immature Granulocytes: 0.05 10*3/uL (ref 0.00–0.07)
Abs Immature Granulocytes: 0.09 10*3/uL — ABNORMAL HIGH (ref 0.00–0.07)
Basophils Absolute: 0 10*3/uL (ref 0.0–0.1)
Basophils Absolute: 0 10*3/uL (ref 0.0–0.1)
Basophils Relative: 0 %
Basophils Relative: 0 %
Eosinophils Absolute: 0 10*3/uL (ref 0.0–0.5)
Eosinophils Absolute: 0 10*3/uL (ref 0.0–0.5)
Eosinophils Relative: 0 %
Eosinophils Relative: 0 %
HCT: 26.3 % — ABNORMAL LOW (ref 36.0–46.0)
HCT: 32 % — ABNORMAL LOW (ref 36.0–46.0)
Hemoglobin: 8 g/dL — ABNORMAL LOW (ref 12.0–15.0)
Hemoglobin: 10 g/dL — ABNORMAL LOW (ref 12.0–15.0)
Immature Granulocytes: 1 %
Immature Granulocytes: 1 %
Lymphocytes Relative: 7 %
Lymphocytes Relative: 6 %
Lymphs Abs: 0.5 10*3/uL — ABNORMAL LOW (ref 0.7–4.0)
Lymphs Abs: 0.7 10*3/uL (ref 0.7–4.0)
MCH: 27.2 pg (ref 26.0–34.0)
MCH: 27.5 pg (ref 26.0–34.0)
MCHC: 30.4 g/dL (ref 30.0–36.0)
MCHC: 31.3 g/dL (ref 30.0–36.0)
MCV: 89.5 fL (ref 80.0–100.0)
MCV: 87.9 fL (ref 80.0–100.0)
Monocytes Absolute: 0.7 10*3/uL (ref 0.1–1.0)
Monocytes Absolute: 1.5 10*3/uL — ABNORMAL HIGH (ref 0.1–1.0)
Monocytes Relative: 10 %
Monocytes Relative: 14 %
Neutro Abs: 6.1 10*3/uL (ref 1.7–7.7)
Neutro Abs: 8.8 10*3/uL — ABNORMAL HIGH (ref 1.7–7.7)
Neutrophils Relative %: 82 %
Neutrophils Relative %: 79 %
Platelets: 143 10*3/uL — ABNORMAL LOW (ref 150–400)
Platelets: 219 10*3/uL (ref 150–400)
RBC: 2.94 MIL/uL — ABNORMAL LOW (ref 3.87–5.11)
RBC: 3.64 MIL/uL — ABNORMAL LOW (ref 3.87–5.11)
RDW: 17.7 % — ABNORMAL HIGH (ref 11.5–15.5)
RDW: 18.2 % — ABNORMAL HIGH (ref 11.5–15.5)
WBC: 7.4 10*3/uL (ref 4.0–10.5)
WBC: 11.1 10*3/uL — ABNORMAL HIGH (ref 4.0–10.5)
nRBC: 0 % (ref 0.0–0.2)
nRBC: 0 % (ref 0.0–0.2)

## 2019-10-08 LAB — CBC
HCT: 31.8 % — ABNORMAL LOW (ref 36.0–46.0)
Hemoglobin: 9.9 g/dL — ABNORMAL LOW (ref 12.0–15.0)
MCH: 27.6 pg (ref 26.0–34.0)
MCHC: 31.1 g/dL (ref 30.0–36.0)
MCV: 88.6 fL (ref 80.0–100.0)
Platelets: 188 10*3/uL (ref 150–400)
RBC: 3.59 MIL/uL — ABNORMAL LOW (ref 3.87–5.11)
RDW: 18 % — ABNORMAL HIGH (ref 11.5–15.5)
WBC: 9.7 10*3/uL (ref 4.0–10.5)
nRBC: 0 % (ref 0.0–0.2)

## 2019-10-08 LAB — COMPREHENSIVE METABOLIC PANEL
ALT: 22 U/L (ref 0–44)
AST: 44 U/L — ABNORMAL HIGH (ref 15–41)
Albumin: 3 g/dL — ABNORMAL LOW (ref 3.5–5.0)
Alkaline Phosphatase: 63 U/L (ref 38–126)
Anion gap: 7 (ref 5–15)
BUN: 15 mg/dL (ref 8–23)
CO2: 23 mmol/L (ref 22–32)
Calcium: 8 mg/dL — ABNORMAL LOW (ref 8.9–10.3)
Chloride: 101 mmol/L (ref 98–111)
Creatinine, Ser: 0.96 mg/dL (ref 0.44–1.00)
GFR calc Af Amer: >60 mL/min (ref >60)
GFR calc non Af Amer: 60 mL/min — ABNORMAL LOW (ref >60)
Glucose, Bld: 148 mg/dL — ABNORMAL HIGH (ref 70–99)
Potassium: 5.1 mmol/L (ref 3.5–5.1)
Sodium: 131 mmol/L — ABNORMAL LOW (ref 135–145)
Total Bilirubin: 1.5 mg/dL — ABNORMAL HIGH (ref 0.3–1.2)
Total Protein: 6.3 g/dL — ABNORMAL LOW (ref 6.5–8.1)

## 2019-10-08 LAB — BASIC METABOLIC PANEL
Anion gap: 5 (ref 5–15)
BUN: 17 mg/dL (ref 8–23)
CO2: 18 mmol/L — ABNORMAL LOW (ref 22–32)
Calcium: 6.2 mg/dL — CL (ref 8.9–10.3)
Chloride: 113 mmol/L — ABNORMAL HIGH (ref 98–111)
Creatinine, Ser: 0.76 mg/dL (ref 0.44–1.00)
GFR calc Af Amer: >60 mL/min (ref >60)
GFR calc non Af Amer: >60 mL/min (ref >60)
Glucose, Bld: 86 mg/dL (ref 70–99)
Potassium: 2.9 mmol/L — ABNORMAL LOW (ref 3.5–5.1)
Sodium: 136 mmol/L (ref 135–145)

## 2019-10-08 LAB — URINALYSIS, ROUTINE W REFLEX MICROSCOPIC
Bilirubin Urine: NEGATIVE
Glucose, UA: NEGATIVE mg/dL
Ketones, ur: NEGATIVE mg/dL
Nitrite: POSITIVE — AB
Protein, ur: 30 mg/dL — AB
Specific Gravity, Urine: 1.013 (ref 1.005–1.030)
WBC, UA: >50 WBC/hpf — ABNORMAL HIGH (ref 0–5)
pH: 5 (ref 5.0–8.0)

## 2019-10-08 LAB — TYPE AND SCREEN
ABO/RH(D): O POS
Antibody Screen: NEGATIVE

## 2019-10-08 LAB — CREATININE, SERUM
Creatinine, Ser: 1.02 mg/dL — ABNORMAL HIGH (ref 0.44–1.00)
GFR calc Af Amer: >60 mL/min (ref >60)
GFR calc non Af Amer: 55 mL/min — ABNORMAL LOW (ref >60)

## 2019-10-08 LAB — CBG MONITORING, ED
Glucose-Capillary: 92 mg/dL (ref 70–99)
Glucose-Capillary: 241 mg/dL — ABNORMAL HIGH (ref 70–99)
Glucose-Capillary: 158 mg/dL — ABNORMAL HIGH (ref 70–99)

## 2019-10-08 LAB — SARS CORONAVIRUS 2 (TAT 6-24 HRS): SARS Coronavirus 2: POSITIVE — AB

## 2019-10-08 LAB — FERRITIN: Ferritin: 86 ng/mL (ref 11–307)

## 2019-10-08 LAB — MAGNESIUM: Magnesium: 1.4 mg/dL — ABNORMAL LOW (ref 1.7–2.4)

## 2019-10-08 LAB — PHOSPHORUS: Phosphorus: 2.1 mg/dL — ABNORMAL LOW (ref 2.5–4.6)

## 2019-10-08 LAB — PROCALCITONIN: Procalcitonin: 0.17 ng/mL

## 2019-10-08 LAB — C-REACTIVE PROTEIN: CRP: 14.7 mg/dL — ABNORMAL HIGH (ref <1.0)

## 2019-10-08 LAB — D-DIMER, QUANTITATIVE: D-Dimer, Quant: 2.69 ug/mL-FEU — ABNORMAL HIGH (ref 0.00–0.50)

## 2019-10-08 MED ORDER — ACETAMINOPHEN 650 MG RE SUPP: 650.0000 mg | Freq: Four times a day (QID) | RECTAL | Status: DC | PRN

## 2019-10-08 MED ORDER — ESCITALOPRAM OXALATE 20 MG PO TABS
20.0000 mg | ORAL_TABLET | Freq: Every day | ORAL | Status: DC
  Administered 2019-10-08 – 2019-10-15 (×8): 20 mg via ORAL
  Filled 2019-10-08 (×4): qty 1
  Filled 2019-10-08: qty 2
  Filled 2019-10-08 (×3): qty 1

## 2019-10-08 MED ORDER — GUAIFENESIN-DM 100-10 MG/5ML PO SYRP
10.0000 mL | ORAL_SOLUTION | ORAL | Status: DC | PRN
  Administered 2019-10-13 – 2019-10-14 (×2): 10 mL via ORAL
  Filled 2019-10-08 (×2): qty 10

## 2019-10-08 MED ORDER — BUDESONIDE 0.25 MG/2ML IN SUSP: 0.2500 mg | Freq: Two times a day (BID) | RESPIRATORY_TRACT | Status: DC

## 2019-10-08 MED ORDER — DEXAMETHASONE 4 MG PO TABS
6.0000 mg | ORAL_TABLET | ORAL | Status: DC
  Administered 2019-10-08 – 2019-10-14 (×7): 6 mg via ORAL
  Filled 2019-10-08: qty 1
  Filled 2019-10-08 (×6): qty 2

## 2019-10-08 MED ORDER — PANTOPRAZOLE SODIUM 40 MG PO TBEC
40.0000 mg | DELAYED_RELEASE_TABLET | Freq: Every day | ORAL | Status: DC
  Administered 2019-10-08 – 2019-10-15 (×8): 40 mg via ORAL
  Filled 2019-10-08 (×8): qty 1

## 2019-10-08 MED ORDER — ALBUTEROL SULFATE (2.5 MG/3ML) 0.083% IN NEBU: 2.5000 mg | INHALATION_SOLUTION | RESPIRATORY_TRACT | Status: DC | PRN

## 2019-10-08 MED ORDER — POTASSIUM CHLORIDE CRYS ER 20 MEQ PO TBCR
40.0000 meq | EXTENDED_RELEASE_TABLET | Freq: Once | ORAL | Status: AC
  Administered 2019-10-08: 10:00:00 40 meq via ORAL
  Filled 2019-10-08: qty 2

## 2019-10-08 MED ORDER — INSULIN ASPART 100 UNIT/ML ~~LOC~~ SOLN
0.0000 [IU] | Freq: Three times a day (TID) | SUBCUTANEOUS | Status: DC
  Administered 2019-10-08: 2 [IU] via SUBCUTANEOUS
  Administered 2019-10-08: 3 [IU] via SUBCUTANEOUS
  Administered 2019-10-09: 2 [IU] via SUBCUTANEOUS
  Administered 2019-10-09: 3 [IU] via SUBCUTANEOUS
  Filled 2019-10-08: qty 0.09

## 2019-10-08 MED ORDER — SODIUM CHLORIDE 0.9 % IV SOLN: INTRAVENOUS | Status: DC

## 2019-10-08 MED ORDER — ONDANSETRON HCL 4 MG PO TABS: 4.0000 mg | ORAL_TABLET | Freq: Four times a day (QID) | ORAL | Status: DC | PRN

## 2019-10-08 MED ORDER — LORATADINE 10 MG PO TABS
10.0000 mg | ORAL_TABLET | Freq: Every day | ORAL | Status: DC
  Administered 2019-10-08 – 2019-10-15 (×8): 10 mg via ORAL
  Filled 2019-10-08 (×8): qty 1

## 2019-10-08 MED ORDER — MAGNESIUM SULFATE 2 GM/50ML IV SOLN
2.0000 g | Freq: Once | INTRAVENOUS | Status: AC
  Administered 2019-10-08: 2 g via INTRAVENOUS
  Filled 2019-10-08: qty 50

## 2019-10-08 MED ORDER — CALCIUM GLUCONATE-NACL 1-0.675 GM/50ML-% IV SOLN
1.0000 g | Freq: Once | INTRAVENOUS | Status: AC
  Administered 2019-10-08: 1000 mg via INTRAVENOUS
  Filled 2019-10-08: qty 50

## 2019-10-08 MED ORDER — SODIUM CHLORIDE 0.9 % IV SOLN
100.0000 mg | Freq: Every day | INTRAVENOUS | Status: AC
  Administered 2019-10-09 – 2019-10-12 (×4): 100 mg via INTRAVENOUS
  Filled 2019-10-08 (×4): qty 100

## 2019-10-08 MED ORDER — POLYETHYLENE GLYCOL 3350 17 G PO PACK: 17.0000 g | PACK | Freq: Every day | ORAL | Status: DC | PRN

## 2019-10-08 MED ORDER — ALBUTEROL SULFATE HFA 108 (90 BASE) MCG/ACT IN AERS: 2.0000 | INHALATION_SPRAY | RESPIRATORY_TRACT | Status: DC | PRN

## 2019-10-08 MED ORDER — ATORVASTATIN CALCIUM 40 MG PO TABS
40.0000 mg | ORAL_TABLET | Freq: Every day | ORAL | Status: DC
  Administered 2019-10-08 – 2019-10-15 (×8): 40 mg via ORAL
  Filled 2019-10-08 (×8): qty 1

## 2019-10-08 MED ORDER — FLUTICASONE PROPIONATE HFA 220 MCG/ACT IN AERO: 2.0000 | INHALATION_SPRAY | Freq: Two times a day (BID) | RESPIRATORY_TRACT | Status: DC

## 2019-10-08 MED ORDER — POTASSIUM PHOSPHATES 15 MMOLE/5ML IV SOLN
20.0000 mmol | Freq: Once | INTRAVENOUS | Status: AC
  Administered 2019-10-08: 20 mmol via INTRAVENOUS
  Filled 2019-10-08: qty 6.67

## 2019-10-08 MED ORDER — ASPIRIN EC 81 MG PO TBEC
81.0000 mg | DELAYED_RELEASE_TABLET | Freq: Every day | ORAL | Status: DC
  Administered 2019-10-08 – 2019-10-15 (×8): 81 mg via ORAL
  Filled 2019-10-08 (×8): qty 1

## 2019-10-08 MED ORDER — BUPROPION HCL ER (XL) 150 MG PO TB24
150.0000 mg | ORAL_TABLET | Freq: Every day | ORAL | Status: DC
  Administered 2019-10-08 – 2019-10-15 (×8): 150 mg via ORAL
  Filled 2019-10-08 (×8): qty 1

## 2019-10-08 MED ORDER — ENOXAPARIN SODIUM 40 MG/0.4ML ~~LOC~~ SOLN
40.0000 mg | Freq: Every day | SUBCUTANEOUS | Status: DC
  Administered 2019-10-08 – 2019-10-14 (×8): 40 mg via SUBCUTANEOUS
  Filled 2019-10-08 (×9): qty 0.4

## 2019-10-08 MED ORDER — SODIUM CHLORIDE 0.9 % IV SOLN
200.0000 mg | Freq: Once | INTRAVENOUS | Status: AC
  Administered 2019-10-08: 200 mg via INTRAVENOUS
  Filled 2019-10-08: qty 200

## 2019-10-08 MED ORDER — ONDANSETRON HCL 4 MG/2ML IJ SOLN: 4.0000 mg | Freq: Four times a day (QID) | INTRAMUSCULAR | Status: DC | PRN

## 2019-10-08 MED ORDER — METOPROLOL TARTRATE 25 MG PO TABS: 25.0000 mg | ORAL_TABLET | ORAL | Status: DC

## 2019-10-08 MED ORDER — ACETAMINOPHEN 325 MG PO TABS
650.0000 mg | ORAL_TABLET | Freq: Four times a day (QID) | ORAL | Status: DC | PRN
  Administered 2019-10-13 – 2019-10-14 (×2): 650 mg via ORAL
  Filled 2019-10-08 (×2): qty 2

## 2019-10-08 NOTE — Progress Notes (Signed)
PROGRESS NOTE    [name]  IZT:[id number] DOB: 10-29-1945 DOA: 10/07/2019 PCP: [name], MD (Confirm with patient/family/NH records and if not entered, this HAS to be entered at [facility] point of entry. "No PCP" if truly none.)   Brief Narrative:  HPI: [name] is a 73 y.o. female with medical history significant of COPD, type 2 diabetes, hypertension, hyperlipidemia and previous stroke.  Patient was brought to the hospital because of recurrent falls in the past few weeks.  She complained of 8/10 sharp, continuous, right hip pain, nonradiating, worsened by movement with no specific relieving factor.  Patiently, patient does not recollect circumstances leading to her falls and she thinks her legs give out on her.  She denies chest pain, headache, nausea, vomiting, diarrhea, polyuria, seizures or loss of consciousness.  Based on her recurrent falls, she was brought to ED for further evaluation.  Patient condition has been declining since the loss of her husband a few months ago.  ED Course: In the ED, she was hemodynamically stable and afebrile.  Her oxygen saturation was 98% on room air.  Head CT was negative for acute intracranial process.  There was several small scalp hematoma over the right parietal lobe.   Assessment & Plan:   Active Problems:   Recurrent falls   Hypocalcemia   1.  Recurrent falls: There is no evidence of acute CVA.  Etiology is unclear.  PT consult will be established.  Monitor patient closely.  Fall precautions  2.  Right hip pain: Pelvic x-ray was negative for any fracture.  If patient remains symptomatic, we will consider  CT pelvis as part of evaluation.  3.  Dehydration: She appears clinically dehydrated. Continue IV fluids.  Monitor renal function closely  4. Type  2 diabetes: Monitor blood glucose closely.  We will hold Metformin due to acute kidney injury.  We will give insulin sliding scale coverage.  5.  Hypertension:  Monitor blood pressure profile closely.  Continue metoprolol  6.  Hyperlipidemia: Continue statins.  7.  Previous stroke: Continue stroke prevention with aspirin and Lipitor  8. Depression: We will continue lexapro and Wellbutrin  9.  Lung cancer status post chemotherapy and radiation.  No recurrence of disease.  10. Electrolyte derangement: pt denies nausea, vomiting, or diarrhea.   11. Covid positive.  Patient currently with a mild cough but no respiratory distress. O2 sats of stable on RA but has had tachypnea off and on.  -Start Dexamethasone, remedesivir, albuterol MDI, continuous pulse ox. RT consult. Requested transfer to [facility].    DVT prophylaxis: Lovenox [place], proph dose Code Status: Full  Family Communication: Daughter over the phone, she will quarantine and get tested for COVID as the patient lives with the daughter.  Disposition Plan: Inpatient, requested transfer to [place].    Consultants:   None  Procedures:  None  Antimicrobials: Remdesivir 12/15>>  Subjective: Patient reported feeling better but with tachypnea at times.   Objective: Vitals:   10/08/19 1000 10/08/19 1030 10/08/19 1130 10/08/19 1200  BP: (!) 119/91 124/68 132/61 131/66  Pulse: (!) 128 (!) 116 (!) 111 (!) 112  Resp: (!) 35 (!) 24 (!) 31 19  Temp:      TempSrc:      SpO2: 99% 98% 96% 96%  Weight:      Height:        Intake/Output Summary (Last 24 hours) at 10/08/2019 1315 Last data filed at 10/08/2019 0441 Gross per 24 hour  Intake  65.91 ml  Output --  Net 65.91 ml   Filed Weights   10/07/19 1918  Weight: 63 kg    Examination:  General exam: Appears calm and comfortable  Respiratory system: No wheezing, normal respiratory effort  Cardiovascular system: mild tachycardia, S1 and S2 Gastrointestinal system: Abdomen is nondistended, soft and nontender. Central nervous system: Alert and oriented. No focal neurological deficits. Extremities: Symmetric 5 x 5 power. Skin: No  rashes, lesions or ulcers Psychiatry: Mood & affect appropriate.     Data Reviewed: I have personally reviewed following labs and imaging studies  CBC: Recent Labs  Lab 10/07/19 1941 10/08/19 0131 10/08/19 0449  WBC 9.9 9.7 7.4  NEUTROABS  --   --  6.1  HGB 10.9* 9.9* 8.0*  HCT 35.5* 31.8* 26.3*  MCV 87.7 88.6 89.5  PLT 235 188 270*   Basic Metabolic Panel: Recent Labs  Lab 10/07/19 1941 10/08/19 0131 10/08/19 0449  NA 130*  --  136  K 5.3*  --  2.9*  CL 95*  --  113*  CO2 24  --  18*  GLUCOSE 117*  --  86  BUN 25*  --  17  CREATININE 1.27* 1.02* 0.76  CALCIUM 8.9  --  6.2*  MG  --   --  1.4*  PHOS  --   --  2.1*   GFR: Estimated Creatinine Clearance: 55.7 mL/min (by C-G formula based on SCr of 0.76 mg/dL). Liver Function Tests: Recent Labs  Lab 10/07/19 1941  AST 64*  ALT 28  ALKPHOS 70  BILITOT 1.4*  PROT 7.7  ALBUMIN 3.7   No results for input(s): LIPASE, AMYLASE in the last 168 hours. No results for input(s): AMMONIA in the last 168 hours. Coagulation Profile: No results for input(s): INR, PROTIME in the last 168 hours. Cardiac Enzymes: No results for input(s): CKTOTAL, CKMB, CKMBINDEX, TROPONINI in the last 168 hours. BNP (last 3 results) No results for input(s): PROBNP in the last 8760 hours. HbA1C: No results for input(s): HGBA1C in the last 72 hours. CBG: Recent Labs  Lab 10/08/19 0841 10/08/19 1137  GLUCAP 92 241*   Lipid Profile: No results for input(s): CHOL, HDL, LDLCALC, TRIG, CHOLHDL, LDLDIRECT in the last 72 hours. Thyroid Function Tests: No results for input(s): TSH, T4TOTAL, FREET4, T3FREE, THYROIDAB in the last 72 hours. Anemia Panel: No results for input(s): VITAMINB12, FOLATE, FERRITIN, TIBC, IRON, RETICCTPCT in the last 72 hours. Sepsis Labs: No results for input(s): PROCALCITON, LATICACIDVEN in the last 168 hours.  No results found for this or any previous visit (from the past 240 hour(s)).       Radiology  Studies: CT Head Wo Contrast  Result Date: 10/07/2019 CLINICAL DATA:  Fall.  Generalized weakness EXAM: CT HEAD WITHOUT CONTRAST TECHNIQUE: Contiguous axial images were obtained from the base of the skull through the vertex without intravenous contrast. COMPARISON:  Head CT 08/25/2018 FINDINGS: Brain: No acute intracranial hemorrhage. No focal mass lesion. No CT evidence of acute infarction. No midline shift or mass effect. No hydrocephalus. Basilar cisterns are patent. There are periventricular and subcortical white matter hypodensities. Generalized cortical atrophy. Vascular: No hyperdense vessel or unexpected calcification. Skull: Normal. Negative for fracture or focal lesion. Sinuses/Orbits: Paranasal sinuses and mastoid air cells are clear. Orbits are clear. Other: Small scalp hematomas over the RIGHT parietal bone. IMPRESSION: 1. No acute intracranial findings. 2. No evidence of trauma. 3. Atrophy white matter microvascular disease. 4. Several small scalp hematomas over the RIGHT parietal bone.  No skull fracture Electronically Signed   By: Suzy Bouchard M.D.   On: 10/07/2019 20:38   DG Chest Port 1 View  Result Date: 10/07/2019 CLINICAL DATA:  Weakness EXAM: PORTABLE CHEST 1 VIEW COMPARISON:  Eight AT 18 FINDINGS: Stable soft tissue density in the right paratracheal upper lobe, likely radiation changes. Volume loss on the right with elevation of the right hemidiaphragm, stable. Left lung clear. Heart is normal size. No effusions or acute bony abnormality. IMPRESSION: No acute cardiopulmonary disease. Electronically Signed   By: Rolm Baptise M.D.   On: 10/07/2019 20:45   DG HIP UNILAT WITH PELVIS 1V RIGHT  Result Date: 10/08/2019 CLINICAL DATA:  Pain in right hip status post multiple falls. EXAM: DG HIP (WITH OR WITHOUT PELVIS) 1V RIGHT COMPARISON:  None. FINDINGS: There is no evidence of hip fracture or dislocation. There is no evidence of arthropathy or other focal bone abnormality.  IMPRESSION: Negative. Electronically Signed   By: Constance Holster M.D.   On: 10/08/2019 00:30        Scheduled Meds: . aspirin EC  81 mg Oral Daily  . atorvastatin  40 mg Oral Daily  . budesonide (PULMICORT) nebulizer solution  0.25 mg Nebulization BID  . buPROPion  150 mg Oral Daily  . enoxaparin (LOVENOX) injection  40 mg Subcutaneous QHS  . escitalopram  20 mg Oral Daily  . insulin aspart  0-9 Units Subcutaneous TID WC  . loratadine  10 mg Oral Daily  . metoprolol tartrate  25-50 mg Oral See admin instructions  . pantoprazole  40 mg Oral Daily  . sodium chloride flush  3 mL Intravenous Once   Continuous Infusions: . sodium chloride 75 mL/hr at 10/08/19 0444  . magnesium sulfate bolus IVPB    . potassium PHOSPHATE IVPB (in mmol) 20 mmol (10/08/19 1017)     LOS: 0 days    Time spent: 35 minutes    Blain Pais, MD [facility]   If 7PM-7AM, please contact night-coverage [email or web address] Password [email or web address] 10/08/2019, 1:15 PM

## 2019-10-08 NOTE — Telephone Encounter (Signed)
Pt's daughter was advised regarding CT results.

## 2019-10-08 NOTE — Telephone Encounter (Signed)
Reviewed H&P->head CT showed no acute abnormality.  Pls cancel CT I ordered.

## 2019-10-08 NOTE — ED Notes (Signed)
Pt provided with diet coke. Pt alert, resting in bed with even and unlabored respirations. Pt denies pain at this time. No distress observed.

## 2019-10-08 NOTE — H&P (Signed)
History and Physical    Tamiyah Moulin KXF:[id number] DOB: August 10, 1946 DOA: 10/07/2019  PCP: [name], MD  Patient coming from: Home  Chief Complaint: Recurrent falls  HPI: [name] is a 73 y.o. female with medical history significant of COPD, type 2 diabetes, hypertension, hyperlipidemia and previous stroke.  Patient was brought to the hospital because of recurrent falls in the past few weeks.  She complained of 8/10 sharp, continuous, right hip pain, nonradiating, worsened by movement with no specific relieving factor.  Patiently, patient does not recollect circumstances leading to her falls and she thinks her legs give out on her.  She denies chest pain, headache, nausea, vomiting, diarrhea, polyuria, seizures or loss of consciousness.  Based on her recurrent falls, she was brought to ED for further evaluation.  Patient condition has been declining since the loss of her husband a few months ago.  ED Course: In the ED, she was hemodynamically stable and afebrile.  Her oxygen saturation was 98% on room air.  Head CT was negative for acute intracranial process.  There was several small scalp hematoma over the right parietal lobe.  Review of Systems: As per HPI otherwise 10 point review of systems negative.   Past Medical History:  Diagnosis Date  . Age-related nuclear cataract of both eyes 2016   +cortical age related cataracts OU   . Anxiety   . Arthritis    hands  . Bilateral diabetic retinopathy (HCC) 2015   Dr. Zigmund Daniel  . Blood transfusion without reported diagnosis    when taking chemo  . Chronic renal insufficiency, stage 3 (moderate)    GFR 50s  . 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  Marland Kitchen COPD (chronic obstructive pulmonary disease) ([place])   . CVA (cerebral vascular accident) ([place]) 09/2016   MRI did show a left-sided small ischemic stroke which was acute but likely incidental (MRI was done b/c pt had TIA sx's in  hospital).  Neuro put her on plavix at that time.  Cardiology d/c'd her plavix 08/2017.  . Depression   . Diabetes mellitus with complication (HCC)    diab retinopathy OU (laser)  . GERD (gastroesophageal reflux disease)    protonix  . History of concussion 08/25/2018   w/out loss of consciousness--mild increase in baseline memory impairment after.  CT head neg acute.  Marland Kitchen History of diverticulitis of colon    with abscess; required IR percutaneous drain placed 09/2016.  Marland Kitchen Hyperlipidemia   . Hypertension   . Iron deficiency anemia    started iron 07/02/19.  EGD/colonoscopy 08/27/19 unrevealing. Capsule endoscopy to be done.  . Left bundle branch block (LBBB) 12/16/2016  . Lung cancer (Uncertain)    non-small cell lung ca, stage III in 05/2011; systemic chemotherapy concurrent with radiation followed by prophylactic cranial irradiation and has been observation since July of 2010 with no evidence for disease recurrence-released from onc f/u 12/2014 (needs annual cxr by PCP).  CXR 08/2015 stable.  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 Earlie Server did bx and this was NEG for malignancy.  . Mild cognitive impairment with memory loss    Likely from brain radiation therapy  . Non-obstructive CAD (coronary artery disease)    a. Cath 2006 preserved LV fxn, scattered irregularities without critical stenosis; b. 2008 stress echo negative for ischemia, but with hypertensive response; c. 08/2016 Cath: D1 25%, otw nl.  . Normocytic anemia 2018   Iron, vit B12, folate all normal 12/2016.  Marland Kitchen  Open-angle glaucoma 2016   Dr. Shirley Muscat, (bilateral)---responding to topical therapy  . Osteoporosis 03/2016; 03/2018   03/2016 DEXA T-score -2.2.  03/2018 T-score L femoral neck  -2.7--boniva started.  . Pneumonia    hospitalization 11/2016; hypoxemic resp failure--d/c'd home with home oxygen therapy  . Seasonal allergic rhinitis   . Subclinical hyperthyroidism 2018  . Takotsubo cardiomyopathy    a. 08/2016 Echo: EF 30-35%, gr1DD,  PASP 3mHg;  b. 08/2016 Cath: nonobs Dzs;  c. 11/2016 Echo: EF 40-45%, no rwma, nl RV fxn, mild TR, PASP 322mg.  ECHO 02/2017 EF 50-55%, grd II DD---essentially resolved Takotsubo 02/2017.  . Torsades de pointes ([facility]arper11/2017   a. 08/2016 in setting of diverticulitis & pneumoperitoneum and Takotsubo CM -->prolonged QT, seen by EP-->avoid meds with potential for QT prolongation.    Past Surgical History:  Procedure Laterality Date  . ABDOMINAL HYSTERECTOMY  1997  . APPENDECTOMY    . CARDIAC CATHETERIZATION N/A 09/14/2016   Procedure: Left Heart Cath and Coronary Angiography;  Surgeon: ThTroy SineMD;  [facility];  Service: Cardiovascular; Mild nonobstructive CAD with 85% smooth narrowing in the first diagonal branch of the LAD; 10% smooth narrowing of the ostial proximal left circumflex coronary artery; and a normal dominant RCA.  . Marland KitchenARPAL TUNNEL RELEASE    . CATARACT EXTRACTION Bilateral   . CHOLECYSTECTOMY  2002  . COLONOSCOPY  10/24/00; 08/27/19   2002 normal.  [employer] hemoccult testing via [facility] 06/18/15 was NEG.  2020->diverticulosis o/w nl.  . dexa  03/2016; 03/2018   03/2018 T-score L femoral neck  -2.7 (worsened compared to osteopenic range 2017.)  . ESOPHAGOGASTRODUODENOSCOPY  08/27/2019   Normal.  . IR GENERIC HISTORICAL  09/19/2016   IR SINUS/FIST TUBE CHK-NON GI 09/19/2016 JaCorrie MckusickDO [facility]  . IR GENERIC HISTORICAL  10/06/2016   IR RADIOLOGIST EVAL & MGMT 10/06/2016 [facility]  . IR GENERIC HISTORICAL  10/26/2016   IR RADIOLOGIST EVAL & MGMT 10/26/2016 [facility]  . IR GENERIC HISTORICAL  11/03/2016   IR RADIOLOGIST EVAL & MGMT 11/03/2016 WeArdis RowanPA-C [facility]  . IR GENERIC HISTORICAL  11/24/2016   IR RADIOLOGIST EVAL & MGMT 11/24/2016 WeArdis RowanPA-C [facility]  . IR GENERIC HISTORICAL  12/05/2016   Fistula smaller/improving.  IR SINUS/FIST TUBE CHK-NON GI 12/05/2016 JoSandi MariscalMD [facility]  . IR  GENERIC HISTORICAL  12/22/2016   IR RADIOLOGIST EVAL & MGMT 12/22/2016 MiGreggory KeenMD [facility]  . IR RADIOLOGIST EVAL & MGMT  01/10/2017  . IR RADIOLOGIST EVAL & MGMT  02/09/2017  . IR SINUS/FIST TUBE CHK-NON GI  03/28/2017  . LEFT HEART CATHETERIZATION WITH CORONARY ANGIOGRAM N/A 05/30/2012   Procedure: LEFT HEART CATHETERIZATION WITH CORONARY ANGIOGRAM;  Surgeon: ChBurnell BlanksMD;  [facility];  Service: Cardiovascular: Mild, non-obstructive CAD  . TRANSTHORACIC ECHOCARDIOGRAM  09/14/2016; 11/2016   08/2016: EF 30-35 %, Akinesis of the mid-apicalanteroseptal myocardium.  Grade I DD.  Mild pulm HTN.  Repeat echo 11/2016, EF 40-45%, no rwma, nl RV fxn, mild TR, PASP 3753m.    . TMarland KitchenANSTHORACIC ECHOCARDIOGRAM  03/07/2017   EF 50-55%. Hypokinesis of the distal septum with overall low normal LV,  systolic function; mild diastolic dysfunction with elevated LV  filling pressure; mildly calcified aortic valve with mild AI; small pericardial effusion     reports that she quit smoking about 10 years ago. Her smoking use included  cigarettes. She has a 45.00 pack-year smoking history. She has never used smokeless tobacco. She reports that she does not drink alcohol or use drugs.  Allergies  Allergen Reactions  . Lactose Intolerance (Gi) Diarrhea  . Ibuprofen Other (See Comments)    REACTION: Anxiousness, hyperventilates    Family History  Problem Relation Age of Onset  . Heart failure Mother   . Kidney disease Mother        renal failure  . Diabetes Mother   . Diabetes Father   . Diabetes Brother   . Heart disease Brother   . Heart attack Brother   . Heart attack Brother   . Heart attack Brother   . Pancreatic cancer Brother   . Colon cancer Neg Hx   . Esophageal cancer Neg Hx   . Stomach cancer Neg Hx   . Rectal cancer Neg Hx    Family history :positive for heart disease   Prior to Admission medications   Medication Sig Start Date End Date Taking? Authorizing  Provider  albuterol (PROVENTIL HFA;VENTOLIN HFA) 108 (90 Base) MCG/ACT inhaler INHALE 2 PUFFS BY MOUTH EVERY 6 HOURS AS NEEDED FOR WHEEZING OR SHORTNESS OF BREATH Patient taking differently: Inhale 2 puffs into the lungs every 6 (six) hours as needed for wheezing or shortness of breath.  12/21/18  Yes McGowen, [name], MD  albuterol (PROVENTIL) (2.5 MG/3ML) 0.083% nebulizer solution USE 1 VIAL IN NEBULIZER EVERY 4 HOURS AS NEEDED FOR WHEEZING AND FOR SHORTNESS OF BREATH Patient taking differently: Take 2.5 mg by nebulization every 4 (four) hours as needed for wheezing or shortness of breath.  09/16/19  Yes McGowen, [name], MD  aspirin 81 MG EC tablet Take 81 mg by mouth daily.    Yes [provider]  atorvastatin (LIPITOR) 40 MG tablet Take 1 tablet (40 mg total) by mouth daily. 12/13/18  Yes McGowen, [name], MD  buPROPion (WELLBUTRIN XL) 150 MG 24 hr tablet Take 1 tablet by mouth once daily Patient taking differently: Take 150 mg by mouth daily.  09/02/19  Yes McGowen, [name], MD  cetirizine (ZYRTEC) 10 MG tablet Take 10 mg by mouth daily.   Yes [provider]  escitalopram (LEXAPRO) 20 MG tablet Take 1 tablet by mouth once daily Patient taking differently: Take 20 mg by mouth daily.  06/10/19  Yes McGowen, [name], MD  ferrous sulfate 325 (65 FE) MG EC tablet Take 325 mg by mouth 2 (two) times daily.   Yes [provider]  FLOVENT HFA 220 MCG/ACT inhaler Inhale 2 puffs by mouth twice daily Patient taking differently: Inhale 2 puffs into the lungs 2 (two) times daily.  05/18/19  Yes McGowen, [name], MD  ibandronate (BONIVA) 150 MG tablet Take 1 tablet (150 mg total) by mouth every 30 (thirty) days. Take in the morning with a full glass of water, on an empty stomach, and do not take anything else by mouth or lie down for the next 30 min. 05/06/19  Yes McGowen, [name], MD  Insulin Isophane & Regular Human (HUMULIN 70/30 KWIKPEN) (70-30) 100 UNIT/ML PEN Take 4 U SQ  with breakfast and 2 U SQ with supper. Patient taking differently: Inject 2-4 Units into the skin See admin instructions. Take 4 U SQ with breakfast and 2 U SQ with supper. 09/02/19  Yes McGowen, [name], MD  metFORMIN (GLUCOPHAGE) 500 MG tablet TAKE 1 TABLET BY MOUTH TWICE DAILY WITH A MEAL Patient taking differently: Take 500 mg  by mouth 2 (two) times daily with a meal.  08/05/19  Yes McGowen, [name], MD  metoprolol tartrate (LOPRESSOR) 25 MG tablet TAKE 1 TABLET BY MOUTH IN THE MORNING AND 2 AT BEDTIME Patient taking differently: Take 25-50 mg by mouth See admin instructions. Take one tablet in the morning, then take 2 tablets in the evening per daughter 04/15/19  Yes Leonie Man, MD  ONE TOUCH ULTRA TEST test strip USE  STRIP TO CHECK GLUCOSE THREE TIMES DAILY 05/31/18  Yes McGowen, [name], MD  [facility] DELICA LANCETS 69V MISC USE 1  TO CHECK GLUCOSE THREE TIMES DAILY 05/04/18  Yes McGowen, [name], MD  pantoprazole (PROTONIX) 40 MG tablet Take 1 tablet by mouth once daily Patient taking differently: Take 40 mg by mouth daily.  08/05/19  Yes McGowen, [name], MD  potassium chloride (KLOR-CON) 10 MEQ tablet Take 1 tablet by mouth once daily Patient taking differently: Take 10 mEq by mouth daily.  08/05/19  Yes [name], MD    Physical Exam: Vitals:   10/07/19 2330 10/08/19 0000 10/08/19 0030 10/08/19 0034  BP: 129/62 123/78 (!) 126/59   Pulse: 95 96 92 95  Resp: (!) 27 (!) 24 (!) 24 16  Temp:      TempSrc:      SpO2: 96% 92% 96% 96%  Weight:      Height:        Constitutional: NAD, calm, comfortable Vitals:   10/07/19 2330 10/08/19 0000 10/08/19 0030 10/08/19 0034  BP: 129/62 123/78 (!) 126/59   Pulse: 95 96 92 95  Resp: (!) 27 (!) 24 (!) 24 16  Temp:      TempSrc:      SpO2: 96% 92% 96% 96%  Weight:      Height:       Eyes: PERRL, lids and conjunctivae normal ENMT: Mucous membranes are moist. Posterior pharynx clear of any exudate or lesions.Normal  dentition.  Neck: normal, supple, no masses, no thyromegaly Respiratory: clear to auscultation bilaterally, no wheezing, no crackles. Normal respiratory effort. No accessory muscle use.  Cardiovascular: Regular rate and rhythm, no murmurs / rubs / gallops. No extremity edema. 2+ pedal pulses. No carotid bruits.  Abdomen: no tenderness, no masses palpated. No hepatosplenomegaly. Bowel sounds positive.  Musculoskeletal: no clubbing / cyanosis. No joint deformity upper and lower extremities.  Limited ROM, no contractures. Normal muscle tone.  Skin: no rashes, lesions, ulcers. No induration Neurologic: CN 2-12 grossly intact. Sensation intact, DTR normal. Strength 5/5 in all 4.  Psychiatric: Normal judgment and insight. Alert and oriented x 3. Normal mood.    Labs on Admission: I have personally reviewed following labs and imaging studies  CBC: Recent Labs  Lab 10/07/19 1941  WBC 9.9  HGB 10.9*  HCT 35.5*  MCV 87.7  PLT 948   Basic Metabolic Panel: Recent Labs  Lab 10/07/19 1941  NA 130*  K 5.3*  CL 95*  CO2 24  GLUCOSE 117*  BUN 25*  CREATININE 1.27*  CALCIUM 8.9   GFR: Estimated Creatinine Clearance: 35.1 mL/min (A) (by C-G formula based on SCr of 1.27 mg/dL (H)). Liver Function Tests: Recent Labs  Lab 10/07/19 1941  AST 64*  ALT 28  ALKPHOS 70  BILITOT 1.4*  PROT 7.7  ALBUMIN 3.7   No results for input(s): LIPASE, AMYLASE in the last 168 hours. No results for input(s): AMMONIA in the last 168 hours. Coagulation Profile: No results for input(s): INR, PROTIME in  the last 168 hours. Cardiac Enzymes: No results for input(s): CKTOTAL, CKMB, CKMBINDEX, TROPONINI in the last 168 hours. BNP (last 3 results) No results for input(s): PROBNP in the last 8760 hours. HbA1C: No results for input(s): HGBA1C in the last 72 hours. CBG: No results for input(s): GLUCAP in the last 168 hours. Lipid Profile: No results for input(s): CHOL, HDL, LDLCALC, TRIG, CHOLHDL, LDLDIRECT  in the last 72 hours. Thyroid Function Tests: No results for input(s): TSH, T4TOTAL, FREET4, T3FREE, THYROIDAB in the last 72 hours. Anemia Panel: No results for input(s): VITAMINB12, FOLATE, FERRITIN, TIBC, IRON, RETICCTPCT in the last 72 hours. Urine analysis:    Component Value Date/Time   COLORURINE YELLOW 06/19/2019 1626   APPEARANCEUR TURBID (A) 06/19/2019 1626   LABSPEC 1.032 (H) 06/19/2019 1626   PHURINE 6.0 06/19/2019 1626   GLUCOSEU NEGATIVE 06/19/2019 1626   HGBUR SMALL (A) 06/19/2019 1626   BILIRUBINUR NEGATIVE 06/19/2019 1626   KETONESUR NEGATIVE 06/19/2019 1626   PROTEINUR 30 (A) 06/19/2019 1626   UROBILINOGEN 1.0 02/03/2013 0912   NITRITE NEGATIVE 06/19/2019 1626   LEUKOCYTESUR LARGE (A) 06/19/2019 1626    Radiological Exams on Admission: CT Head Wo Contrast  Result Date: 10/07/2019 CLINICAL DATA:  Fall.  Generalized weakness EXAM: CT HEAD WITHOUT CONTRAST TECHNIQUE: Contiguous axial images were obtained from the base of the skull through the vertex without intravenous contrast. COMPARISON:  Head CT 08/25/2018 FINDINGS: Brain: No acute intracranial hemorrhage. No focal mass lesion. No CT evidence of acute infarction. No midline shift or mass effect. No hydrocephalus. Basilar cisterns are patent. There are periventricular and subcortical white matter hypodensities. Generalized cortical atrophy. Vascular: No hyperdense vessel or unexpected calcification. Skull: Normal. Negative for fracture or focal lesion. Sinuses/Orbits: Paranasal sinuses and mastoid air cells are clear. Orbits are clear. Other: Small scalp hematomas over the RIGHT parietal bone. IMPRESSION: 1. No acute intracranial findings. 2. No evidence of trauma. 3. Atrophy white matter microvascular disease. 4. Several small scalp hematomas over the RIGHT parietal bone. No skull fracture Electronically Signed   By: Suzy Bouchard M.D.   On: 10/07/2019 20:38   DG Chest Port 1 View  Result Date: 10/07/2019 CLINICAL  DATA:  Weakness EXAM: PORTABLE CHEST 1 VIEW COMPARISON:  Eight AT 18 FINDINGS: Stable soft tissue density in the right paratracheal upper lobe, likely radiation changes. Volume loss on the right with elevation of the right hemidiaphragm, stable. Left lung clear. Heart is normal size. No effusions or acute bony abnormality. IMPRESSION: No acute cardiopulmonary disease. Electronically Signed   By: Rolm Baptise M.D.   On: 10/07/2019 20:45   DG HIP UNILAT WITH PELVIS 1V RIGHT  Result Date: 10/08/2019 CLINICAL DATA:  Pain in right hip status post multiple falls. EXAM: DG HIP (WITH OR WITHOUT PELVIS) 1V RIGHT COMPARISON:  None. FINDINGS: There is no evidence of hip fracture or dislocation. There is no evidence of arthropathy or other focal bone abnormality. IMPRESSION: Negative. Electronically Signed   By: Constance Holster M.D.   On: 10/08/2019 00:30    EKG: Independently reviewed.  Sinus rhythm with left bundle branch block  Assessment/Plan Active Problems:   Recurrent falls  1.  Recurrent falls: There is no evidence of acute CVA.  Etiology is unclear.  PT consult will be established.  Monitor patient closely.  Fall precautions  2.  Right hip pain: Pelvic x-ray was negative for any fracture.  If patient remains symptomatic, we will consider  CT pelvis as part of evaluation.  3.  Acute kidney injury: She appears clinically dehydrated.  We will give IV fluids, avoid nephrotoxic agents and renally dose all medications.  Monitor renal function closely  4. Type  2 diabetes: Monitor blood glucose closely.  We will hold Metformin due to acute kidney injury.  We will give insulin sliding scale coverage.  5.  Hypertension: Monitor blood pressure profile closely.  Continue metoprolol  6.  Hyperlipidemia: Continue statins.  7.  Previous stroke: Continue stroke prevention with aspirin and Lipitor  8. Depression: We will continue lexapro and Wellbutrin  9.  Lung cancer status post chemotherapy and  radiation.  No recurrence of disease.  DVT prophylaxis: Lovenox  Code Status: Full code Family Communication: We will discuss with the daughter in the morning Disposition Plan: Patient may benefit from short-term rehab  Consults called: none Admission status: Observation   Phineas Semen MD [facility] Pager [phone]  If 7PM-7AM, please contact night-coverage [email or web address] Password [facility]  10/08/2019, 12:37 AM

## 2019-10-08 NOTE — Telephone Encounter (Signed)
FYI. Please see below.  Ivin Booty w/ [facility] called to notify PCP that she is putting pt on hold for OT.

## 2019-10-08 NOTE — ED Notes (Signed)
Pt provided peri-care, new linins and chuck placed, pt placed on new purewick by RN and EMT.

## 2019-10-08 NOTE — Telephone Encounter (Signed)
Pt's daughter, [name] advised regarding CT but did let me know they had to take patient to the ED. She was not sure if CT would be done while she was in the hospital.

## 2019-10-08 NOTE — ED Notes (Signed)
Per Respiratory, hold Pulmicort until COVID result.

## 2019-10-08 NOTE — ED Notes (Signed)
Pt resting with eyes closed, even and unlabored respirations. Pt has call light in right hand. No distress observed.

## 2019-10-08 NOTE — ED Notes (Signed)
ED TO INPATIENT HANDOFF REPORT  ED Nurse Name and Phone #:     Anderson Malta [phone]  S Name/Age/Gender [name] 73 y.o. female Room/Bed: WA03/WA03  Code Status   Code Status: Full Code  Home/SNF/Other Home Patient oriented to: self, place, time, situation Is this baseline? Yes   Triage Complete: Triage complete  Chief Complaint Recurrent falls [R29.6] Hypocalcemia [E83.51]  Triage Note Per EMS, pt complains of generalized weakness since last Thursday. Pt has fallen several times in the past couple weeks. Pt was alert and oriented x 4. Pt has decreased mobility, is unable to walk due to weakness. Pt denies cough, fever, shortness of breath.   CBG 138   Pt having active coughing at this time.     Allergies Allergies  Allergen Reactions  . Lactose Intolerance (Gi) Diarrhea  . Ibuprofen Other (See Comments)    REACTION: Anxiousness, hyperventilates    Level of Care/Admitting Diagnosis ED Disposition    ED Disposition Condition Comment   Admit  Hospital Area: [place] [[id number]]  Level of Care: Telemetry [5]  Admit to tele based on following criteria: Eval of Syncope  Covid Evaluation: Confirmed COVID Positive  Diagnosis: Hypocalcemia [275.41.ICD-9-CM]  Admitting Physician: Blain Pais [[id number]]  Attending Physician: Adele Barthel D [[id number]]  Estimated length of stay: 3 - 4 days  Certification:: I certify this patient will need inpatient services for at least 2 midnights       B Medical/Surgery History Past Medical History:  Diagnosis Date  . Age-related nuclear cataract of both eyes 2016   +cortical age related cataracts OU   . Anxiety   . Arthritis    hands  . Bilateral diabetic retinopathy (HCC) 2015   Dr. Zigmund Daniel  . Blood transfusion without reported diagnosis    when taking chemo  . Chronic renal insufficiency, stage 3 (moderate)    GFR 50s  . 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  Marland Kitchen COPD (chronic obstructive pulmonary disease) ([place])   . CVA (cerebral vascular accident) ([place]) 09/2016   MRI did show a left-sided small ischemic stroke which was acute but likely incidental (MRI was done b/c pt had TIA sx's in hospital).  Neuro put her on plavix at that time.  Cardiology d/c'd her plavix 08/2017.  . Depression   . Diabetes mellitus with complication (HCC)    diab retinopathy OU (laser)  . GERD (gastroesophageal reflux disease)    protonix  . History of concussion 08/25/2018   w/out loss of consciousness--mild increase in baseline memory impairment after.  CT head neg acute.  Marland Kitchen History of diverticulitis of colon    with abscess; required IR percutaneous drain placed 09/2016.  Marland Kitchen Hyperlipidemia   . Hypertension   . Iron deficiency anemia    started iron 07/02/19.  EGD/colonoscopy 08/27/19 unrevealing. Capsule endoscopy to be done.  . Left bundle branch block (LBBB) 12/16/2016  . Lung cancer ([place])    non-small cell lung ca, stage III in 05/2011; systemic chemotherapy concurrent with radiation followed by prophylactic cranial irradiation and has been observation since July of 2010 with no evidence for disease recurrence-released from onc f/u 12/2014 (needs annual cxr by PCP).  CXR 08/2015 stable.  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 Earlie Server did bx and this was NEG for malignancy.  . Mild cognitive impairment with memory loss    Likely from brain radiation therapy  . Non-obstructive CAD (coronary artery disease)    a. Cath  2006 preserved LV fxn, scattered irregularities without critical stenosis; b. 2008 stress echo negative for ischemia, but with hypertensive response; c. 08/2016 Cath: D1 25%, otw nl.  . Normocytic anemia 2018   Iron, vit B12, folate all normal 12/2016.  Marland Kitchen Open-angle glaucoma 2016   Dr. Shirley Muscat, (bilateral)---responding to topical therapy  . Osteoporosis 03/2016; 03/2018   03/2016 DEXA T-score -2.2.  03/2018 T-score L femoral neck  -2.7--boniva  started.  . Pneumonia    hospitalization 11/2016; hypoxemic resp failure--d/c'd home with home oxygen therapy  . Seasonal allergic rhinitis   . Subclinical hyperthyroidism 2018  . Takotsubo cardiomyopathy    a. 08/2016 Echo: EF 30-35%, gr1DD, PASP 56mHg;  b. 08/2016 Cath: nonobs Dzs;  c. 11/2016 Echo: EF 40-45%, no rwma, nl RV fxn, mild TR, PASP 316mg.  ECHO 02/2017 EF 50-55%, grd II DD---essentially resolved Takotsubo 02/2017.  . [place]   a. 08/2016 in setting of diverticulitis & pneumoperitoneum and Takotsubo CM -->prolonged QT, seen by EP-->avoid meds with potential for QT prolongation.   Past Surgical History:  Procedure Laterality Date  . ABDOMINAL HYSTERECTOMY  1997  . APPENDECTOMY    . CARDIAC CATHETERIZATION N/A 09/14/2016   Procedure: Left Heart Cath and Coronary Angiography;  Surgeon: ThTroy SineMD;  [facility];  Service: Cardiovascular; Mild nonobstructive CAD with 85% smooth narrowing in the first diagonal branch of the LAD; 10% smooth narrowing of the ostial proximal left circumflex coronary artery; and a normal dominant RCA.  . Marland KitchenARPAL TUNNEL RELEASE    . CATARACT EXTRACTION Bilateral   . CHOLECYSTECTOMY  2002  . COLONOSCOPY  10/24/00; 08/27/19   2002 normal.  [employer] hemoccult testing via [facility] 06/18/15 was NEG.  2020->diverticulosis o/w nl.  . dexa  03/2016; 03/2018   03/2018 T-score L femoral neck  -2.7 (worsened compared to osteopenic range 2017.)  . ESOPHAGOGASTRODUODENOSCOPY  08/27/2019   Normal.  . IR GENERIC HISTORICAL  09/19/2016   IR SINUS/FIST TUBE CHK-NON GI 09/19/2016 JaCorrie MckusickDO [facility]  . IR GENERIC HISTORICAL  10/06/2016   IR RADIOLOGIST EVAL & MGMT 10/06/2016 [facility]  . IR GENERIC HISTORICAL  10/26/2016   IR RADIOLOGIST EVAL & MGMT 10/26/2016 [facility]  . IR GENERIC HISTORICAL  11/03/2016   IR RADIOLOGIST EVAL & MGMT 11/03/2016 WeArdis RowanPA-C [facility]  . IR GENERIC  HISTORICAL  11/24/2016   IR RADIOLOGIST EVAL & MGMT 11/24/2016 WeArdis RowanPA-C [facility]  . IR GENERIC HISTORICAL  12/05/2016   Fistula smaller/improving.  IR SINUS/FIST TUBE CHK-NON GI 12/05/2016 JoSandi MariscalMD [facility]  . IR GENERIC HISTORICAL  12/22/2016   IR RADIOLOGIST EVAL & MGMT 12/22/2016 MiGreggory KeenMD [facility]  . IR RADIOLOGIST EVAL & MGMT  01/10/2017  . IR RADIOLOGIST EVAL & MGMT  02/09/2017  . IR SINUS/FIST TUBE CHK-NON GI  03/28/2017  . LEFT HEART CATHETERIZATION WITH CORONARY ANGIOGRAM N/A 05/30/2012   Procedure: LEFT HEART CATHETERIZATION WITH CORONARY ANGIOGRAM;  Surgeon: ChBurnell BlanksMD;  [facility];  Service: Cardiovascular: Mild, non-obstructive CAD  . TRANSTHORACIC ECHOCARDIOGRAM  09/14/2016; 11/2016   08/2016: EF 30-35 %, Akinesis of the mid-apicalanteroseptal myocardium.  Grade I DD.  Mild pulm HTN.  Repeat echo 11/2016, EF 40-45%, no rwma, nl RV fxn, mild TR, PASP 3714m.    . TMarland KitchenANSTHORACIC ECHOCARDIOGRAM  03/07/2017   EF 50-55%. Hypokinesis of the distal septum with overall  low normal LV,  systolic function; mild diastolic dysfunction with elevated LV  filling pressure; mildly calcified aortic valve with mild AI; small pericardial effusion     A IV Location/Drains/Wounds Patient Lines/Drains/Airways Status   Active Line/Drains/Airways    Name:   Placement date:   Placement time:   Site:   Days:   Peripheral IV 10/07/19 Left Antecubital   10/07/19    1941    Antecubital   1   External Urinary Catheter   10/08/19    0130    -   less than 1   Incision (Closed) 09/12/17 Chest Mid   09/12/17    1207     756          Intake/Output Last 24 hours  Intake/Output Summary (Last 24 hours) at 10/08/2019 2326 Last data filed at 10/08/2019 2102 Gross per 24 hour  Intake 365.91 ml  Output 850 ml  Net -484.09 ml    Labs/Imaging Results for orders placed or performed during the hospital encounter of 10/07/19 (from the past 48  hour(s))  Basic metabolic panel     Status: Abnormal   Collection Time: 10/07/19  7:41 PM  Result Value Ref Range   Sodium 130 (L) 135 - 145 mmol/L   Potassium 5.3 (H) 3.5 - 5.1 mmol/L   Chloride 95 (L) 98 - 111 mmol/L   CO2 24 22 - 32 mmol/L   Glucose, Bld 117 (H) 70 - 99 mg/dL   BUN 25 (H) 8 - 23 mg/dL   Creatinine, Ser 1.27 (H) 0.44 - 1.00 mg/dL   Calcium 8.9 8.9 - 10.3 mg/dL   GFR calc non Af Amer 42 (L) >60 mL/min   GFR calc Af Amer 49 (L) >60 mL/min   Anion gap 11 5 - 15    Comment: Performed at [facility], [place] [address] [place], [place] [address]  CBC     Status: Abnormal   Collection Time: 10/07/19  7:41 PM  Result Value Ref Range   WBC 9.9 4.0 - 10.5 K/uL   RBC 4.05 3.87 - 5.11 MIL/uL   Hemoglobin 10.9 (L) 12.0 - 15.0 g/dL   HCT 35.5 (L) 36.0 - 46.0 %   MCV 87.7 80.0 - 100.0 fL   MCH 26.9 26.0 - 34.0 pg   MCHC 30.7 30.0 - 36.0 g/dL   RDW 17.8 (H) 11.5 - 15.5 %   Platelets 235 150 - 400 K/uL   nRBC 0.0 0.0 - 0.2 %    Comment: Performed at [facility], [place] [address] [place], [place] [address]  Hepatic function panel     Status: Abnormal   Collection Time: 10/07/19  7:41 PM  Result Value Ref Range   Total Protein 7.7 6.5 - 8.1 g/dL   Albumin 3.7 3.5 - 5.0 g/dL   AST 64 (H) 15 - 41 U/L   ALT 28 0 - 44 U/L   Alkaline Phosphatase 70 38 - 126 U/L   Total Bilirubin 1.4 (H) 0.3 - 1.2 mg/dL   Bilirubin, Direct 0.5 (H) 0.0 - 0.2 mg/dL   Indirect Bilirubin 0.9 0.3 - 0.9 mg/dL    Comment: Performed at [facility], [place] [address] [place], [place] [address]  SARS CORONAVIRUS 2 (TAT 6-24 HRS) Nasopharyngeal Nasopharyngeal Swab     Status: Abnormal   Collection Time: 10/07/19 11:36 PM   Specimen: Nasopharyngeal Swab  Result Value Ref Range   SARS Coronavirus 2 POSITIVE (A) NEGATIVE  Comment: RESULT CALLED TO, READ BACK BY AND VERIFIED WITH: Z TEETERS,RN 1537 10/08/2019 D BRADLEY (NOTE) SARS-CoV-2 target  nucleic acids are DETECTED. The SARS-CoV-2 RNA is generally detectable in upper and lower respiratory specimens during the acute phase of infection. Positive results are indicative of the presence of SARS-CoV-2 RNA. Clinical correlation with patient history and other diagnostic information is  necessary to determine patient infection status. Positive results do not rule out bacterial infection or co-infection with other viruses.  The expected result is Negative. Fact Sheet for Patients: SugarRoll.be Fact Sheet for Healthcare Providers: https://www.woods-mathews.com/ This test is not yet approved or cleared by the [place] FDA and  has been authorized for detection and/or diagnosis of SARS-CoV-2 by FDA under an Emergency Use Authorization (EUA). This EUA will remain  in effect (meaning this test can be used) for  the duration of the COVID-19 declaration under Section 564(b)(1) of the Act, 21 U.S.C. section 360bbb-3(b)(1), unless the authorization is terminated or revoked sooner. Performed at [facility], [place] [address], [place], [place] [address]   CBC     Status: Abnormal   Collection Time: 10/08/19  1:31 AM  Result Value Ref Range   WBC 9.7 4.0 - 10.5 K/uL   RBC 3.59 (L) 3.87 - 5.11 MIL/uL   Hemoglobin 9.9 (L) 12.0 - 15.0 g/dL   HCT 31.8 (L) 36.0 - 46.0 %   MCV 88.6 80.0 - 100.0 fL   MCH 27.6 26.0 - 34.0 pg   MCHC 31.1 30.0 - 36.0 g/dL   RDW 18.0 (H) 11.5 - 15.5 %   Platelets 188 150 - 400 K/uL   nRBC 0.0 0.0 - 0.2 %    Comment: Performed at [facility], [place] [address] [place] [address] [place], [place] [address]  Creatinine, serum     Status: Abnormal   Collection Time: 10/08/19  1:31 AM  Result Value Ref Range   Creatinine, Ser 1.02 (H) 0.44 - 1.00 mg/dL   GFR calc non Af Amer 55 (L) >60 mL/min   GFR calc Af Amer >60 >60 mL/min    Comment: Performed at [facility], [place] [address] [place],  [place] [address]  Urinalysis, Routine w reflex microscopic     Status: Abnormal   Collection Time: 10/08/19  4:37 AM  Result Value Ref Range   Color, Urine YELLOW YELLOW   APPearance HAZY (A) CLEAR   Specific Gravity, Urine 1.013 1.005 - 1.030   pH 5.0 5.0 - 8.0   Glucose, UA NEGATIVE NEGATIVE mg/dL   Hgb urine dipstick SMALL (A) NEGATIVE   Bilirubin Urine NEGATIVE NEGATIVE   Ketones, ur NEGATIVE NEGATIVE mg/dL   Protein, ur 30 (A) NEGATIVE mg/dL   Nitrite POSITIVE (A) NEGATIVE   Leukocytes,Ua LARGE (A) NEGATIVE   RBC / HPF 0-5 0 - 5 RBC/hpf   WBC, UA >50 (H) 0 - 5 WBC/hpf   Bacteria, UA MANY (A) NONE SEEN   Squamous Epithelial / LPF 0-5 0 - 5    Comment: Performed at [facility], [place] [address], [place], [place] [address]  CBC WITH DIFFERENTIAL     Status: Abnormal   Collection Time: 10/08/19  4:49 AM  Result Value Ref Range   WBC 7.4 4.0 - 10.5 K/uL   RBC 2.94 (L) 3.87 - 5.11 MIL/uL   Hemoglobin 8.0 (L) 12.0 - 15.0 g/dL   HCT 26.3 (L) 36.0 - 46.0 %   MCV 89.5 80.0 - 100.0 fL  MCH 27.2 26.0 - 34.0 pg   MCHC 30.4 30.0 - 36.0 g/dL   RDW 17.7 (H) 11.5 - 15.5 %   Platelets 143 (L) 150 - 400 K/uL   nRBC 0.0 0.0 - 0.2 %   Neutrophils Relative % 82 %   Neutro Abs 6.1 1.7 - 7.7 K/uL   Lymphocytes Relative 7 %   Lymphs Abs 0.5 (L) 0.7 - 4.0 K/uL   Monocytes Relative 10 %   Monocytes Absolute 0.7 0.1 - 1.0 K/uL   Eosinophils Relative 0 %   Eosinophils Absolute 0.0 0.0 - 0.5 K/uL   Basophils Relative 0 %   Basophils Absolute 0.0 0.0 - 0.1 K/uL   Immature Granulocytes 1 %   Abs Immature Granulocytes 0.05 0.00 - 0.07 K/uL    Comment: Performed at [facility], [place] [address], [place], [place] [address]  Basic metabolic panel     Status: Abnormal   Collection Time: 10/08/19  4:49 AM  Result Value Ref Range   Sodium 136 135 - 145 mmol/L   Potassium 2.9 (L) 3.5 - 5.1 mmol/L    Comment: DELTA CHECK NOTED   Chloride 113 (H) 98 - 111 mmol/L   CO2 18  (L) 22 - 32 mmol/L   Glucose, Bld 86 70 - 99 mg/dL   BUN 17 8 - 23 mg/dL   Creatinine, Ser 0.76 0.44 - 1.00 mg/dL   Calcium 6.2 (LL) 8.9 - 10.3 mg/dL    Comment: CRITICAL RESULT CALLED TO, READ BACK BY AND VERIFIED WITH: RN CELINE AT 0549 10/08/19 CRUICKSHANK A    GFR calc non Af Amer >60 >60 mL/min   GFR calc Af Amer >60 >60 mL/min   Anion gap 5 5 - 15    Comment: Performed at [facility], [place] [address], [place], [place] [address]  Magnesium     Status: Abnormal   Collection Time: 10/08/19  4:49 AM  Result Value Ref Range   Magnesium 1.4 (L) 1.7 - 2.4 mg/dL    Comment: Performed at [facility], [place] [address], [place], [place] [address]  Phosphorus     Status: Abnormal   Collection Time: 10/08/19  4:49 AM  Result Value Ref Range   Phosphorus 2.1 (L) 2.5 - 4.6 mg/dL    Comment: Performed at [facility], [place] [address] [place], [place] [address]  CBG monitoring, ED     Status: None   Collection Time: 10/08/19  8:41 AM  Result Value Ref Range   Glucose-Capillary 92 70 - 99 mg/dL  CBG monitoring, ED     Status: Abnormal   Collection Time: 10/08/19 11:37 AM  Result Value Ref Range   Glucose-Capillary 241 (H) 70 - 99 mg/dL   Comment 1 Notify RN   CBG monitoring, ED     Status: Abnormal   Collection Time: 10/08/19  5:08 PM  Result Value Ref Range   Glucose-Capillary 158 (H) 70 - 99 mg/dL   Comment 1 Notify RN   Type and screen [place]     Status: None   Collection Time: 10/08/19  6:03 PM  Result Value Ref Range   ABO/RH(D) O POS    Antibody Screen NEG    Sample Expiration      10/11/2019,2359 Performed at [facility], [place] [address], [place], [place] [address]   C-reactive protein     Status: Abnormal   Collection Time: 10/08/19  7:16 PM  Result Value Ref Range   CRP 14.7 (H) <1.0 mg/dL    Comment: Performed at [facility], [place] [address]  [place], [place] [address]  Ferritin     Status: None   Collection Time: 10/08/19  7:16 PM  Result Value Ref Range   Ferritin 86 11 - 307 ng/mL    Comment: Performed at [facility], [place] [address], [place], [place] [address]  Comprehensive metabolic panel     Status: Abnormal   Collection Time: 10/08/19  7:17 PM  Result Value Ref Range   Sodium 131 (L) 135 - 145 mmol/L   Potassium 5.1 3.5 - 5.1 mmol/L    Comment: DELTA CHECK NOTED SLIGHT HEMOLYSIS    Chloride 101 98 - 111 mmol/L   CO2 23 22 - 32 mmol/L   Glucose, Bld 148 (H) 70 - 99 mg/dL   BUN 15 8 - 23 mg/dL   Creatinine, Ser 0.96 0.44 - 1.00 mg/dL   Calcium 8.0 (L) 8.9 - 10.3 mg/dL   Total Protein 6.3 (L) 6.5 - 8.1 g/dL   Albumin 3.0 (L) 3.5 - 5.0 g/dL   AST 44 (H) 15 - 41 U/L   ALT 22 0 - 44 U/L   Alkaline Phosphatase 63 38 - 126 U/L   Total Bilirubin 1.5 (H) 0.3 - 1.2 mg/dL   GFR calc non Af Amer 60 (L) >60 mL/min   GFR calc Af Amer >60 >60 mL/min   Anion gap 7 5 - 15    Comment: Performed at [facility], [place] [address] [place], [place] [address]  CBC with Differential/Platelet     Status: Abnormal   Collection Time: 10/08/19  7:17 PM  Result Value Ref Range   WBC 11.1 (H) 4.0 - 10.5 K/uL   RBC 3.64 (L) 3.87 - 5.11 MIL/uL   Hemoglobin 10.0 (L) 12.0 - 15.0 g/dL    Comment: REPEATED TO VERIFY POST TRANSFUSION SPECIMEN    HCT 32.0 (L) 36.0 - 46.0 %   MCV 87.9 80.0 - 100.0 fL   MCH 27.5 26.0 - 34.0 pg   MCHC 31.3 30.0 - 36.0 g/dL   RDW 18.2 (H) 11.5 - 15.5 %   Platelets 219 150 - 400 K/uL   nRBC 0.0 0.0 - 0.2 %   Neutrophils Relative % 79 %   Neutro Abs 8.8 (H) 1.7 - 7.7 K/uL   Lymphocytes Relative 6 %   Lymphs Abs 0.7 0.7 - 4.0 K/uL   Monocytes Relative 14 %   Monocytes Absolute 1.5 (H) 0.1 - 1.0 K/uL   Eosinophils Relative 0 %   Eosinophils Absolute 0.0 0.0 - 0.5 K/uL   Basophils Relative 0 %   Basophils Absolute 0.0 0.0 - 0.1 K/uL   Immature Granulocytes 1 %   Abs  Immature Granulocytes 0.09 (H) 0.00 - 0.07 K/uL    Comment: Performed at [facility], [place] [address], [place], [place] [address]  D-dimer, quantitative (not at [facility])     Status: Abnormal   Collection Time: 10/08/19  7:17 PM  Result Value Ref Range   D-Dimer, Quant 2.69 (H) 0.00 - 0.50 ug/mL-FEU    Comment: (NOTE) At the manufacturer cut-off of 0.50 ug/mL FEU, this assay has been documented to exclude PE with a sensitivity and negative predictive value of 97 to 99%.  At this time, this assay has not been approved by the FDA to exclude DVT/VTE. Results should be correlated with clinical presentation. Performed  at [facility], [place] [address] [place],  [address]   Procalcitonin     Status: None   Collection Time: 10/08/19  7:17 PM  Result Value Ref Range   Procalcitonin 0.17 ng/mL    Comment:        Interpretation: PCT (Procalcitonin) <= 0.5 ng/mL: Systemic infection (sepsis) is not likely. Local bacterial infection is possible. (NOTE)       Sepsis PCT Algorithm           Lower Respiratory Tract                                      Infection PCT Algorithm    ----------------------------     ----------------------------         PCT < 0.25 ng/mL                PCT < 0.10 ng/mL         Strongly encourage             Strongly discourage   discontinuation of antibiotics    initiation of antibiotics    ----------------------------     -----------------------------       PCT 0.25 - 0.50 ng/mL            PCT 0.10 - 0.25 ng/mL               OR       >80% decrease in PCT            Discourage initiation of                                            antibiotics      Encourage discontinuation           of antibiotics    ----------------------------     -----------------------------         PCT >= 0.50 ng/mL              PCT 0.26 - 0.50 ng/mL               AND        <80% decrease in PCT             Encourage initiation of                                              antibiotics       Encourage continuation           of antibiotics    ----------------------------     -----------------------------        PCT >= 0.50 ng/mL                  PCT > 0.50 ng/mL               AND         increase in PCT                  Strongly encourage  initiation of antibiotics    Strongly encourage escalation           of antibiotics                                     -----------------------------                                           PCT <= 0.25 ng/mL                                                 OR                                        > 80% decrease in PCT                                     Discontinue / Do not initiate                                             antibiotics Performed at [place] [address], [place], [place] [address]    CT Head Wo Contrast  Result Date: 10/07/2019 CLINICAL DATA:  Fall.  Generalized weakness EXAM: CT HEAD WITHOUT CONTRAST TECHNIQUE: Contiguous axial images were obtained from the base of the skull through the vertex without intravenous contrast. COMPARISON:  Head CT 08/25/2018 FINDINGS: Brain: No acute intracranial hemorrhage. No focal mass lesion. No CT evidence of acute infarction. No midline shift or mass effect. No hydrocephalus. Basilar cisterns are patent. There are periventricular and subcortical white matter hypodensities. Generalized cortical atrophy. Vascular: No hyperdense vessel or unexpected calcification. Skull: Normal. Negative for fracture or focal lesion. Sinuses/Orbits: Paranasal sinuses and mastoid air cells are clear. Orbits are clear. Other: Small scalp hematomas over the RIGHT parietal bone. IMPRESSION: 1. No acute intracranial findings. 2. No evidence of trauma. 3. Atrophy white matter microvascular disease. 4. Several small scalp hematomas over the RIGHT parietal bone. No skull fracture Electronically Signed   By: Suzy Bouchard M.D.   On: 10/07/2019 20:38   DG Chest Port 1 View  Result Date: 10/07/2019 CLINICAL DATA:  Weakness EXAM: PORTABLE CHEST 1 VIEW COMPARISON:  Eight AT 18 FINDINGS: Stable soft tissue density in the right paratracheal upper lobe, likely radiation changes. Volume loss on the right with elevation of the right hemidiaphragm, stable. Left lung clear. Heart is normal size. No effusions or acute bony abnormality. IMPRESSION: No acute cardiopulmonary disease. Electronically Signed   By: Rolm Baptise M.D.   On: 10/07/2019 20:45   DG HIP UNILAT WITH PELVIS 1V RIGHT  Result Date: 10/08/2019 CLINICAL DATA:  Pain in right hip status post multiple falls. EXAM: DG HIP (WITH OR WITHOUT PELVIS) 1V RIGHT COMPARISON:  None. FINDINGS: There is no evidence of hip fracture or dislocation. There is no evidence of arthropathy or other focal  bone abnormality. IMPRESSION: Negative. Electronically Signed   By: Constance Holster M.D.   On: 10/08/2019 00:30    Pending Labs Unresulted Labs (From admission, onward)    Start     Ordered   10/15/19 0500  Creatinine, serum  (enoxaparin (LOVENOX)    CrCl >/= 30 ml/min)  Weekly,   R    Comments: while on enoxaparin therapy    10/08/19 0048   10/09/19 0500  CBC with Differential/Platelet  Daily,   R     10/08/19 1804   10/09/19 0500  Comprehensive metabolic panel  Daily,   R     10/08/19 1804   10/09/19 0500  C-reactive protein  Daily,   R     10/08/19 1804   10/09/19 0500  D-dimer, quantitative (not at [facility])  Daily,   R     10/08/19 1804   10/09/19 0500  Magnesium  Daily,   R     10/08/19 1804   10/09/19 0500  Phosphorus  Daily,   R     10/08/19 1804          Vitals/Pain Today's Vitals   10/08/19 2130 10/08/19 2200 10/08/19 2230 10/08/19 2300  BP: 139/62 (!) 134/56 (!) 138/59 138/64  Pulse: (!) 101 97 94 95  Resp: (!) 24 (!) 26 (!) 28 (!) 23  Temp:      TempSrc:      SpO2: 96% 95% 94% 94%  Weight:      Height:      PainSc:        Isolation  Precautions Airborne and Contact precautions  Medications Medications  sodium chloride flush (NS) 0.9 % injection 3 mL (0 mLs Intravenous Hold 10/08/19 0042)  aspirin EC tablet 81 mg (81 mg Oral Given 10/08/19 1024)  atorvastatin (LIPITOR) tablet 40 mg (40 mg Oral Given 10/08/19 1023)  metoprolol tartrate (LOPRESSOR) tablet 25-50 mg (has no administration in time range)  buPROPion (WELLBUTRIN XL) 24 hr tablet 150 mg (150 mg Oral Given 10/08/19 1025)  escitalopram (LEXAPRO) tablet 20 mg (20 mg Oral Given 10/08/19 1024)  pantoprazole (PROTONIX) EC tablet 40 mg (40 mg Oral Given 10/08/19 1024)  loratadine (CLARITIN) tablet 10 mg (10 mg Oral Given 10/08/19 1024)  enoxaparin (LOVENOX) injection 40 mg (40 mg Subcutaneous Given 10/08/19 0123)  0.9 %  sodium chloride infusion ( Intravenous Restarted 10/08/19 0444)  acetaminophen (TYLENOL) tablet 650 mg (has no administration in time range)    Or  acetaminophen (TYLENOL) suppository 650 mg (has no administration in time range)  polyethylene glycol (MIRALAX / GLYCOLAX) packet 17 g (has no administration in time range)  ondansetron (ZOFRAN) tablet 4 mg (has no administration in time range)    Or  ondansetron (ZOFRAN) injection 4 mg (has no administration in time range)  insulin aspart (novoLOG) injection 0-9 Units (2 Units Subcutaneous Given 10/08/19 1713)  dexamethasone (DECADRON) tablet 6 mg (6 mg Oral Given 10/08/19 1856)  remdesivir 200 mg in sodium chloride 0.9% 250 mL IVPB (0 mg Intravenous Stopped 10/08/19 2102)    Followed by  remdesivir 100 mg in sodium chloride 0.9 % 100 mL IVPB (has no administration in time range)  guaiFENesin-dextromethorphan (ROBITUSSIN DM) 100-10 MG/5ML syrup 10 mL (has no administration in time range)  albuterol (VENTOLIN HFA) 108 (90 Base) MCG/ACT inhaler 2 puff (has no administration in time range)  sodium chloride 0.9 % bolus 500 mL (0 mLs Intravenous Stopped 10/08/19 0035)  calcium gluconate 1 g/ 50 mL sodium  chloride  IVPB (0 g Intravenous Stopped 10/08/19 1017)  potassium PHOSPHATE 20 mmol in dextrose 5 % 500 mL infusion (0 mmol Intravenous Paused 10/08/19 1846)  potassium chloride SA (KLOR-CON) CR tablet 40 mEq (40 mEq Oral Given 10/08/19 1024)  magnesium sulfate IVPB 2 g 50 mL (0 g Intravenous Stopped 10/08/19 1717)    Mobility walks with device High fall risk   Focused Assessments Pulmonary Assessment Handoff:  Lung sounds:   O2 Device: Room Air        R Recommendations: See Admitting Provider Note  Report given to:  Maudie Mercury, [place]

## 2019-10-09 DIAGNOSIS — E1165 Type 2 diabetes mellitus with hyperglycemia: Secondary | ICD-10-CM

## 2019-10-09 DIAGNOSIS — M25551 Pain in right hip: Secondary | ICD-10-CM

## 2019-10-09 DIAGNOSIS — R296 Repeated falls: Secondary | ICD-10-CM

## 2019-10-09 DIAGNOSIS — Z8673 Personal history of transient ischemic attack (TIA), and cerebral infarction without residual deficits: Secondary | ICD-10-CM

## 2019-10-09 DIAGNOSIS — M810 Age-related osteoporosis without current pathological fracture: Secondary | ICD-10-CM

## 2019-10-09 DIAGNOSIS — I5032 Chronic diastolic (congestive) heart failure: Secondary | ICD-10-CM

## 2019-10-09 DIAGNOSIS — E871 Hypo-osmolality and hyponatremia: Secondary | ICD-10-CM

## 2019-10-09 DIAGNOSIS — E872 Acidosis: Secondary | ICD-10-CM

## 2019-10-09 DIAGNOSIS — R5381 Other malaise: Secondary | ICD-10-CM

## 2019-10-09 DIAGNOSIS — R8281 Pyuria: Secondary | ICD-10-CM

## 2019-10-09 DIAGNOSIS — E860 Dehydration: Secondary | ICD-10-CM

## 2019-10-09 DIAGNOSIS — R8271 Bacteriuria: Secondary | ICD-10-CM

## 2019-10-09 DIAGNOSIS — E876 Hypokalemia: Secondary | ICD-10-CM

## 2019-10-09 DIAGNOSIS — U071 COVID-19: Secondary | ICD-10-CM

## 2019-10-09 LAB — COMPREHENSIVE METABOLIC PANEL
ALT: 19 U/L (ref 0–44)
AST: 23 U/L (ref 15–41)
Albumin: 2.7 g/dL — ABNORMAL LOW (ref 3.5–5.0)
Alkaline Phosphatase: 65 U/L (ref 38–126)
Anion gap: 8 (ref 5–15)
BUN: 14 mg/dL (ref 8–23)
CO2: 19 mmol/L — ABNORMAL LOW (ref 22–32)
Calcium: 7.7 mg/dL — ABNORMAL LOW (ref 8.9–10.3)
Chloride: 106 mmol/L (ref 98–111)
Creatinine, Ser: 0.8 mg/dL (ref 0.44–1.00)
GFR calc Af Amer: >60 mL/min (ref >60)
GFR calc non Af Amer: >60 mL/min (ref >60)
Glucose, Bld: 280 mg/dL — ABNORMAL HIGH (ref 70–99)
Potassium: 4.9 mmol/L (ref 3.5–5.1)
Sodium: 133 mmol/L — ABNORMAL LOW (ref 135–145)
Total Bilirubin: 0.4 mg/dL (ref 0.3–1.2)
Total Protein: 6 g/dL — ABNORMAL LOW (ref 6.5–8.1)

## 2019-10-09 LAB — CBC WITH DIFFERENTIAL/PLATELET
Abs Immature Granulocytes: 0.05 10*3/uL (ref 0.00–0.07)
Basophils Absolute: 0 10*3/uL (ref 0.0–0.1)
Basophils Relative: 0 %
Eosinophils Absolute: 0 10*3/uL (ref 0.0–0.5)
Eosinophils Relative: 0 %
HCT: 31 % — ABNORMAL LOW (ref 36.0–46.0)
Hemoglobin: 9 g/dL — ABNORMAL LOW (ref 12.0–15.0)
Immature Granulocytes: 1 %
Lymphocytes Relative: 5 %
Lymphs Abs: 0.4 10*3/uL — ABNORMAL LOW (ref 0.7–4.0)
MCH: 26.8 pg (ref 26.0–34.0)
MCHC: 29 g/dL — ABNORMAL LOW (ref 30.0–36.0)
MCV: 92.3 fL (ref 80.0–100.0)
Monocytes Absolute: 0.3 10*3/uL (ref 0.1–1.0)
Monocytes Relative: 5 %
Neutro Abs: 6.4 10*3/uL (ref 1.7–7.7)
Neutrophils Relative %: 89 %
Platelets: 181 10*3/uL (ref 150–400)
RBC: 3.36 MIL/uL — ABNORMAL LOW (ref 3.87–5.11)
RDW: 17.9 % — ABNORMAL HIGH (ref 11.5–15.5)
WBC: 7.2 10*3/uL (ref 4.0–10.5)
nRBC: 0 % (ref 0.0–0.2)

## 2019-10-09 LAB — C-REACTIVE PROTEIN: CRP: 16.3 mg/dL — ABNORMAL HIGH (ref <1.0)

## 2019-10-09 LAB — GLUCOSE, CAPILLARY
Glucose-Capillary: 175 mg/dL — ABNORMAL HIGH (ref 70–99)
Glucose-Capillary: 209 mg/dL — ABNORMAL HIGH (ref 70–99)
Glucose-Capillary: 119 mg/dL — ABNORMAL HIGH (ref 70–99)
Glucose-Capillary: 222 mg/dL — ABNORMAL HIGH (ref 70–99)

## 2019-10-09 LAB — PHOSPHORUS: Phosphorus: 2.8 mg/dL (ref 2.5–4.6)

## 2019-10-09 LAB — MAGNESIUM: Magnesium: 2.7 mg/dL — ABNORMAL HIGH (ref 1.7–2.4)

## 2019-10-09 LAB — CBG MONITORING, ED: Glucose-Capillary: 251 mg/dL — ABNORMAL HIGH (ref 70–99)

## 2019-10-09 LAB — D-DIMER, QUANTITATIVE: D-Dimer, Quant: 3.49 ug/mL-FEU — ABNORMAL HIGH (ref 0.00–0.50)

## 2019-10-09 MED ORDER — METOPROLOL TARTRATE 50 MG PO TABS
50.0000 mg | ORAL_TABLET | Freq: Every day | ORAL | Status: DC
  Administered 2019-10-09 – 2019-10-14 (×7): 50 mg via ORAL
  Filled 2019-10-09 (×7): qty 1

## 2019-10-09 MED ORDER — INSULIN ASPART 100 UNIT/ML ~~LOC~~ SOLN
0.0000 [IU] | Freq: Every day | SUBCUTANEOUS | Status: DC
  Administered 2019-10-09: 2 [IU] via SUBCUTANEOUS

## 2019-10-09 MED ORDER — INSULIN ASPART 100 UNIT/ML ~~LOC~~ SOLN: 0.0000 [IU] | Freq: Three times a day (TID) | SUBCUTANEOUS | Status: DC

## 2019-10-09 MED ORDER — METOPROLOL TARTRATE 25 MG PO TABS
25.0000 mg | ORAL_TABLET | Freq: Every day | ORAL | Status: DC
  Administered 2019-10-09 – 2019-10-15 (×7): 25 mg via ORAL
  Filled 2019-10-09 (×8): qty 1

## 2019-10-09 MED ORDER — INSULIN ASPART 100 UNIT/ML ~~LOC~~ SOLN
3.0000 [IU] | Freq: Three times a day (TID) | SUBCUTANEOUS | Status: DC
  Administered 2019-10-09 – 2019-10-10 (×3): 3 [IU] via SUBCUTANEOUS

## 2019-10-09 MED ORDER — LINAGLIPTIN 5 MG PO TABS
5.0000 mg | ORAL_TABLET | Freq: Every day | ORAL | Status: DC
  Administered 2019-10-10 – 2019-10-15 (×6): 5 mg via ORAL
  Filled 2019-10-09 (×6): qty 1

## 2019-10-09 MED ORDER — INSULIN ASPART 100 UNIT/ML ~~LOC~~ SOLN
0.0000 [IU] | Freq: Three times a day (TID) | SUBCUTANEOUS | Status: DC
  Administered 2019-10-10 (×2): 3 [IU] via SUBCUTANEOUS

## 2019-10-09 MED ORDER — INSULIN ASPART 100 UNIT/ML ~~LOC~~ SOLN
0.0000 [IU] | Freq: Every day | SUBCUTANEOUS | Status: DC
  Administered 2019-10-09: 3 [IU] via SUBCUTANEOUS

## 2019-10-09 NOTE — Consult Note (Signed)
   [facility] CM Inpatient Consult   10/09/2019  Wrenn Willcox [birth year] [phone]   Patient is currently active with [facility] for chronic disease management services.  Patient has been engaged by a [facility].  Our community based plan of care has focused on disease management and community resource support.     Hospital liaison will continue to follow for progression and make referral if needed.  Netta Cedars, MSN, [facility] Liaison Nurse Mobile Phone [phone]  Toll free office [phone]

## 2019-10-09 NOTE — Progress Notes (Signed)
Inpatient Diabetes Program Recommendations  AACE/ADA: New Consensus Statement on Inpatient Glycemic Control (2015)  Target Ranges:  Prepandial:   less than 140 mg/dL      Peak postprandial:   less than 180 mg/dL (1-2 hours)      Critically ill patients:  140 - 180 mg/dL   Lab Results  Component Value Date   GLUCAP 175 (H) 10/09/2019   HGBA1C 5.1 08/29/2019    Review of Glycemic Control Results for HENRI, GUEDES (MRN [id number]) as of 10/09/2019 10:17  Ref. Range 10/08/2019 08:41 10/08/2019 11:37 10/08/2019 17:08 10/09/2019 00:19 10/09/2019 08:10  Glucose-Capillary Latest Ref Range: 70 - 99 mg/dL 92 241 (H) 158 (H) 251 (H) 175 (H)   Diabetes history: None  Current orders for Inpatient glycemic control:  Novolog 0-9 units + hs coverage  A1c 5.1% Decadron 6 mg Q 24 hours  Inpatient Diabetes Program Recommendations:    Noted addition of hs scale.   If glucose trends continue to be elevated consider increasing Novolog to 0-15 units tid.  Thanks,  Tama Headings RN, MSN, BC-ADM Inpatient Diabetes Coordinator Team Pager [phone] (8a-5p)

## 2019-10-09 NOTE — Progress Notes (Signed)
Called and spoke with daughter, [name], to update on patient condition. All questions and concerns were addressed, and daughter given patient room phone number to call in and speak with her mom.

## 2019-10-09 NOTE — Progress Notes (Signed)
PROGRESS NOTE  [name] VCB:[id number] DOB: 11/15/1945   PCP: [name], MD  Patient is from: Home.  Lives with daughter.  Uses walker for ambulation at baseline.  DOA: 10/07/2019 LOS: 1  Brief Narrative / Interim history: 73 year old female with history of COPD not on oxygen, DM-2, HTN, HLD, Takotsubo cardiomyopathy, diastolic CHF, LBBB, former tobacco use, CVA, anxiety and depression presenting with recurrent fall and right hip pain and tested positive for COVID-19.  Reportedly patient has been in decline since the loss of her husband few months ago.  In ED, hemodynamically stable and afebrile.  Saturating at 98% on RA.  K2.9.  CO2 18.  Calcium 6.2.  Magnesium 1.4.  Phosphorus 2.1.  Hgb 8.0 (baseline 9-10).  Platelet 143.  UA with nitrites, LE and bacteria.  CT head negative for acute intracranial process but small scalp hematoma over right parietal area.  Hip x-ray negative for acute finding.  CXR without acute finding.  Admitted for recurrent fall, right hip pain, dehydration and physical deconditioning, and tested positive for COVID-19.  Started on remdesivir and Decadron on 10/08/2019.  Subjective: No major events overnight of this morning.  No complaints.  She denies pain anywhere.  Denies dyspnea, cough, GI or UTI symptoms.  Objective: Vitals:   10/09/19 0112 10/09/19 0522 10/09/19 0553 10/09/19 0912  BP: 121/62 (!) 104/50 (!) 116/57 (!) 123/58  Pulse: 86 60 63 71  Resp:  20    Temp: 97.9 F (36.6 C) (!) 97.5 F (36.4 C)    TempSrc: Oral Oral    SpO2: 97% 94% 98%   Weight:      Height:        Intake/Output Summary (Last 24 hours) at 10/09/2019 1300 Last data filed at 10/09/2019 1104 Gross per 24 hour  Intake 1433.88 ml  Output 850 ml  Net 583.88 ml   Filed Weights   10/07/19 1918  Weight: 63 kg    Examination:  GENERAL: No acute distress.  Appears well.  HEENT: MMM.  Vision and hearing grossly intact.  NECK: Supple.  No apparent JVD.  RESP:   No IWOB. Good air movement bilaterally. CVS:  RRR. Heart sounds normal.  ABD/GI/GU: Bowel sounds present. Soft.  Mild tenderness across lower abdomen.  No rebound or guarding. MSK/EXT:  No apparent deformity or edema. Moves extremities. SKIN: no apparent skin lesion or wound NEURO: Awake, alert and oriented appropriately.  No gross deficit.  PSYCH: Calm. Normal affect.   Procedures:  None  Assessment & Plan: Recurrent/physical deconditioning/right hip pain: reportedly she has been in decline since she lost her husband.  No prodrome leading to fall.  Dehydration and COVID-19 infection could have played a role.  CT head and hip x-ray without acute finding other than small scalp hematoma.  Currently pain-free.  No focal tenderness or pain with lower extremity movement.  -Fall precautions and PT/OT -Treat possible treatable causes as below.  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  Improving. -Continue monitoring  Metabolic acidosis: Likely due to IV fluid. -Discontinue IV fluid -Encourage oral hydration  Hypokalemia/hypomagnesemia/hypophosphatemia: Resolved. -Continue monitoring  Anemia of chronic disease: Stable. -Hgb 9-10 (baseline)> 10.9 (admit)> 9.0. -Check anemia panel -Continue ferrous sulfate  Pyuria/bacteriuria: Patient without UTI symptoms. -No treatment recommended.  Uncontrolled DM-2 with hyperglycemia: A1c 5.1% on 08/29/2019.  Hyperglycemia likely due to steroid. Recent Labs    10/09/19 0019 10/09/19 0810 10/09/19 1204  GLUCAP 251* 175* 209*  -SSI-thin, NovoLog 4 units AC and Tradjenta  5 mg daily.  Essential hypertension: Normotensive. -Continue home metoprolol.  Chronic diastolic CHF/history of Takotsubo cardiomyopathy: Appears euvolemic.  No cardiopulmonary symptoms.  Not on diuretics at home. -Monitor fluid status  History of lung cancer s/p chemoradiation in remission -Outpatient follow-up  COVID-19 infection:  Incidentally tested positive.  Has no respiratory or GI symptoms yet.  Inflammatory markers elevated.  CXR negative. Recent Labs    10/08/19 1916 10/08/19 1917 10/09/19 0342  DDIMER  --  2.69* 3.49*  FERRITIN 86  --   --   CRP 14.7*  --  16.3*  -Continue Decadron and remdesivir -Continue inhalers, incentive spirometry -OOB  History of CVA: Stable.  No focal neuro deficit. -Continue home medication  Chronic COPD: Stable.  No respiratory symptoms despite Covid infection. -Continue inhalers and supportive care -Treat Covid as above.  Depression/anxiety: Stable -Continue Wellbutrin and Lexapro                   DVT prophylaxis: Subcu Lovenox Code Status: Full code Family Communication: Patient and/or RN.  Will update family later. Disposition Plan: Remains inpatient Consultants: None   Microbiology summarized: [id number] positive.  Sch Meds:  Scheduled Meds: . aspirin EC  81 mg Oral Daily  . atorvastatin  40 mg Oral Daily  . buPROPion  150 mg Oral Daily  . dexamethasone  6 mg Oral Q24H  . enoxaparin (LOVENOX) injection  40 mg Subcutaneous QHS  . escitalopram  20 mg Oral Daily  . insulin aspart  0-5 Units Subcutaneous QHS  . insulin aspart  0-9 Units Subcutaneous TID WC  . loratadine  10 mg Oral Daily  . metoprolol tartrate  25 mg Oral Daily  . metoprolol tartrate  50 mg Oral QHS  . pantoprazole  40 mg Oral Daily  . sodium chloride flush  3 mL Intravenous Once   Continuous Infusions: . sodium chloride 75 mL/hr at 10/09/19 1058  . remdesivir 100 mg in NS 100 mL 100 mg (10/09/19 1059)   PRN Meds:.acetaminophen **OR** acetaminophen, albuterol, guaiFENesin-dextromethorphan, ondansetron **OR** ondansetron (ZOFRAN) IV, polyethylene glycol  Antimicrobials: Anti-infectives (From admission, onward)   Start     Dose/Rate Route Frequency Ordered Stop   10/09/19 1000  remdesivir 100 mg in sodium chloride 0.9 % 100 mL IVPB     100 mg 200 mL/hr over 30 Minutes  Intravenous Daily 10/08/19 1804 10/13/19 0959   10/08/19 1930  remdesivir 200 mg in sodium chloride 0.9% 250 mL IVPB     200 mg 580 mL/hr over 30 Minutes Intravenous Once 10/08/19 1804 10/08/19 2102       I have personally reviewed the following labs and images: CBC: Recent Labs  Lab 10/07/19 1941 10/08/19 0131 10/08/19 0449 10/08/19 1917 10/09/19 0342  WBC 9.9 9.7 7.4 11.1* 7.2  NEUTROABS  --   --  6.1 8.8* 6.4  HGB 10.9* 9.9* 8.0* 10.0* 9.0*  HCT 35.5* 31.8* 26.3* 32.0* 31.0*  MCV 87.7 88.6 89.5 87.9 92.3  PLT 235 188 143* 219 181   BMP &GFR Recent Labs  Lab 10/07/19 1941 10/08/19 0131 10/08/19 0449 10/08/19 1917 10/09/19 0342  NA 130*  --  136 131* 133*  K 5.3*  --  2.9* 5.1 4.9  CL 95*  --  113* 101 106  CO2 24  --  18* 23 19*  GLUCOSE 117*  --  86 148* 280*  BUN 25*  --  17 15 14   CREATININE 1.27* 1.02* 0.76 0.96 0.80  CALCIUM 8.9  --  6.2* 8.0* 7.7*  MG  --   --  1.4*  --  2.7*  PHOS  --   --  2.1*  --  2.8   Estimated Creatinine Clearance: 55.7 mL/min (by C-G formula based on SCr of 0.8 mg/dL). Liver & Pancreas: Recent Labs  Lab 10/07/19 1941 10/08/19 1917 10/09/19 0342  AST 64* 44* 23  ALT 28 22 19   ALKPHOS 70 63 65  BILITOT 1.4* 1.5* 0.4  PROT 7.7 6.3* 6.0*  ALBUMIN 3.7 3.0* 2.7*   No results for input(s): LIPASE, AMYLASE in the last 168 hours. No results for input(s): AMMONIA in the last 168 hours. Diabetic: No results for input(s): HGBA1C in the last 72 hours. Recent Labs  Lab 10/08/19 1137 10/08/19 1708 10/09/19 0019 10/09/19 0810 10/09/19 1204  GLUCAP 241* 158* 251* 175* 209*   Cardiac Enzymes: No results for input(s): CKTOTAL, CKMB, CKMBINDEX, TROPONINI in the last 168 hours. No results for input(s): PROBNP in the last 8760 hours. Coagulation Profile: No results for input(s): INR, PROTIME in the last 168 hours. Thyroid Function Tests: No results for input(s): TSH, T4TOTAL, FREET4, T3FREE, THYROIDAB in the last 72 hours. Lipid  Profile: No results for input(s): CHOL, HDL, LDLCALC, TRIG, CHOLHDL, LDLDIRECT in the last 72 hours. Anemia Panel: Recent Labs    10/08/19 1916  FERRITIN 86   Urine analysis:    Component Value Date/Time   COLORURINE YELLOW 10/08/2019 0437   APPEARANCEUR HAZY (A) 10/08/2019 0437   LABSPEC 1.013 10/08/2019 0437   PHURINE 5.0 10/08/2019 0437   GLUCOSEU NEGATIVE 10/08/2019 0437   HGBUR SMALL (A) 10/08/2019 0437   BILIRUBINUR NEGATIVE 10/08/2019 0437   KETONESUR NEGATIVE 10/08/2019 0437   PROTEINUR 30 (A) 10/08/2019 0437   UROBILINOGEN 1.0 02/03/2013 0912   NITRITE POSITIVE (A) 10/08/2019 0437   LEUKOCYTESUR LARGE (A) 10/08/2019 0437   Sepsis Labs: Invalid input(s): PROCALCITONIN, [place]  Microbiology: Recent Results (from the past 240 hour(s))  SARS CORONAVIRUS 2 (TAT 6-24 HRS) Nasopharyngeal Nasopharyngeal Swab     Status: Abnormal   Collection Time: 10/07/19 11:36 PM   Specimen: Nasopharyngeal Swab  Result Value Ref Range Status   SARS Coronavirus 2 POSITIVE (A) NEGATIVE Final    Comment: RESULT CALLED TO, READ BACK BY AND VERIFIED WITH: Z TEETERS,RN 1537 10/08/2019 D BRADLEY (NOTE) SARS-CoV-2 target nucleic acids are DETECTED. The SARS-CoV-2 RNA is generally detectable in upper and lower respiratory specimens during the acute phase of infection. Positive results are indicative of the presence of SARS-CoV-2 RNA. Clinical correlation with patient history and other diagnostic information is  necessary to determine patient infection status. Positive results do not rule out bacterial infection or co-infection with other viruses.  The expected result is Negative. Fact Sheet for Patients: SugarRoll.be Fact Sheet for Healthcare Providers: https://www.woods-mathews.com/ This test is not yet approved or cleared by the [place] FDA and  has been authorized for detection and/or diagnosis of SARS-CoV-2 by FDA under an Emergency  Use Authorization (EUA). This EUA will remain  in effect (meaning this test can be used) for  the duration of the COVID-19 declaration under Section 564(b)(1) of the Act, 21 U.S.C. section 360bbb-3(b)(1), unless the authorization is terminated or revoked sooner. Performed at [facility], [place] [address] [place], [place] [address]     Radiology Studies: No results found.  45 minutes with more than 50% spent in reviewing records, counseling patient/family and coordinating care.   Cephas Revard T. [place]  If 7PM-7AM, please contact  night-coverage [email or web address] Password [email or web address] 10/09/2019, 1:00 PM

## 2019-10-09 NOTE — Telephone Encounter (Signed)
Noted  

## 2019-10-09 NOTE — Progress Notes (Signed)
Updated patient's daughter, Nolberto Hanlon.  Expressed appreciation.

## 2019-10-10 DIAGNOSIS — R531 Weakness: Secondary | ICD-10-CM

## 2019-10-10 LAB — CBC WITH DIFFERENTIAL/PLATELET
Abs Immature Granulocytes: 0.07 10*3/uL (ref 0.00–0.07)
Basophils Absolute: 0 10*3/uL (ref 0.0–0.1)
Basophils Relative: 0 %
Eosinophils Absolute: 0 10*3/uL (ref 0.0–0.5)
Eosinophils Relative: 0 %
HCT: 31 % — ABNORMAL LOW (ref 36.0–46.0)
Hemoglobin: 9.5 g/dL — ABNORMAL LOW (ref 12.0–15.0)
Immature Granulocytes: 1 %
Lymphocytes Relative: 5 %
Lymphs Abs: 0.5 10*3/uL — ABNORMAL LOW (ref 0.7–4.0)
MCH: 27.5 pg (ref 26.0–34.0)
MCHC: 30.6 g/dL (ref 30.0–36.0)
MCV: 89.9 fL (ref 80.0–100.0)
Monocytes Absolute: 0.3 10*3/uL (ref 0.1–1.0)
Monocytes Relative: 3 %
Neutro Abs: 10.2 10*3/uL — ABNORMAL HIGH (ref 1.7–7.7)
Neutrophils Relative %: 91 %
Platelets: 289 10*3/uL (ref 150–400)
RBC: 3.45 MIL/uL — ABNORMAL LOW (ref 3.87–5.11)
RDW: 18.1 % — ABNORMAL HIGH (ref 11.5–15.5)
WBC: 11.1 10*3/uL — ABNORMAL HIGH (ref 4.0–10.5)
nRBC: 0 % (ref 0.0–0.2)

## 2019-10-10 LAB — PHOSPHORUS: Phosphorus: 2.2 mg/dL — ABNORMAL LOW (ref 2.5–4.6)

## 2019-10-10 LAB — COMPREHENSIVE METABOLIC PANEL
ALT: 20 U/L (ref 0–44)
AST: 21 U/L (ref 15–41)
Albumin: 2.7 g/dL — ABNORMAL LOW (ref 3.5–5.0)
Alkaline Phosphatase: 68 U/L (ref 38–126)
Anion gap: 9 (ref 5–15)
BUN: 20 mg/dL (ref 8–23)
CO2: 21 mmol/L — ABNORMAL LOW (ref 22–32)
Calcium: 8.2 mg/dL — ABNORMAL LOW (ref 8.9–10.3)
Chloride: 105 mmol/L (ref 98–111)
Creatinine, Ser: 0.87 mg/dL (ref 0.44–1.00)
GFR calc Af Amer: >60 mL/min (ref >60)
GFR calc non Af Amer: >60 mL/min (ref >60)
Glucose, Bld: 219 mg/dL — ABNORMAL HIGH (ref 70–99)
Potassium: 4.6 mmol/L (ref 3.5–5.1)
Sodium: 135 mmol/L (ref 135–145)
Total Bilirubin: 0.4 mg/dL (ref 0.3–1.2)
Total Protein: 6.2 g/dL — ABNORMAL LOW (ref 6.5–8.1)

## 2019-10-10 LAB — GLUCOSE, CAPILLARY
Glucose-Capillary: 203 mg/dL — ABNORMAL HIGH (ref 70–99)
Glucose-Capillary: 230 mg/dL — ABNORMAL HIGH (ref 70–99)
Glucose-Capillary: 135 mg/dL — ABNORMAL HIGH (ref 70–99)
Glucose-Capillary: 56 mg/dL — ABNORMAL LOW (ref 70–99)
Glucose-Capillary: 71 mg/dL (ref 70–99)

## 2019-10-10 LAB — MAGNESIUM: Magnesium: 2.3 mg/dL (ref 1.7–2.4)

## 2019-10-10 LAB — C-REACTIVE PROTEIN: CRP: 11.5 mg/dL — ABNORMAL HIGH (ref <1.0)

## 2019-10-10 LAB — D-DIMER, QUANTITATIVE: D-Dimer, Quant: 2.33 ug/mL-FEU — ABNORMAL HIGH (ref 0.00–0.50)

## 2019-10-10 MED ORDER — INSULIN ASPART 100 UNIT/ML ~~LOC~~ SOLN: 0.0000 [IU] | Freq: Every day | SUBCUTANEOUS | Status: DC

## 2019-10-10 MED ORDER — INSULIN ASPART 100 UNIT/ML ~~LOC~~ SOLN
0.0000 [IU] | Freq: Three times a day (TID) | SUBCUTANEOUS | Status: DC
  Administered 2019-10-10: 2 [IU] via SUBCUTANEOUS
  Administered 2019-10-11 (×2): 3 [IU] via SUBCUTANEOUS
  Administered 2019-10-12 (×2): 5 [IU] via SUBCUTANEOUS

## 2019-10-10 MED ORDER — INFLUENZA VAC A&B SA ADJ QUAD 0.5 ML IM PRSY
0.5000 mL | PREFILLED_SYRINGE | INTRAMUSCULAR | Status: AC
  Administered 2019-10-13: 0.5 mL via INTRAMUSCULAR
  Filled 2019-10-10: qty 0.5

## 2019-10-10 MED ORDER — PNEUMOCOCCAL VAC POLYVALENT 25 MCG/0.5ML IJ INJ
0.5000 mL | INJECTION | INTRAMUSCULAR | Status: AC
  Administered 2019-10-13: 0.5 mL via INTRAMUSCULAR
  Filled 2019-10-10: qty 0.5

## 2019-10-10 MED ORDER — INSULIN ASPART 100 UNIT/ML ~~LOC~~ SOLN
6.0000 [IU] | Freq: Three times a day (TID) | SUBCUTANEOUS | Status: DC
  Administered 2019-10-10 – 2019-10-11 (×3): 6 [IU] via SUBCUTANEOUS

## 2019-10-10 NOTE — Evaluation (Signed)
Occupational Therapy Evaluation Patient Details Name: [name] MRN: [id number] DOB: 1946/01/14 Today's Date: 10/10/2019    History of Present Illness [name] is a 73 year old female with history of COPD not on oxygen, DM-2, HTN, HLD, Takotsubo cardiomyopathy, diastolic CHF, LBBB, former tobacco use, CVA, anxiety and depression presenting with recurrent fall and right hip pain and tested positive for COVID-19.  Reportedly patient has been in decline since the loss of her husband few months ago.   Clinical Impression   Pt seen for OT evaluation this date. Prior to hospital admission, pt reports needing some help from her daughter for bathing and dressing. Pt presents today with decreased fxl activity tolerance and decreased strength for ADL mobility and performance of ADL self care tasks in setting of COVID-19 infection. Pt requires MIN A for all aspects of bed mobility and for both UB/LB ADLs d/t deconditioning. Pt would beenfit from continued skilled OT to address OT problem list (see below). Anticipate HHOT as most appropriate d/c dispo given decline in function, but presence of strong family support.     Follow Up Recommendations  Home health OT    Equipment Recommendations  3 in 1 bedside commode    Recommendations for Other Services       Precautions / Restrictions Precautions Precautions: Fall Restrictions Weight Bearing Restrictions: No Other Position/Activity Restrictions: COVID precautions      Mobility Bed Mobility Overal bed mobility: Needs Assistance Bed Mobility: Sit to Supine;Supine to Sit     Supine to sit: Min assist Sit to supine: Min assist   General bed mobility comments: increased time, hand hold, use of bed rail, HOB elevated  Transfers Overall transfer level: Needs assistance Equipment used: Rolling walker (2 wheeled) Transfers: Sit to/from [facility] Sit to Stand: Min assist;Min guard Stand pivot transfers:  Min assist       General transfer comment: Pt declines to perform t/f (sit<>stand) on OT evaluation citing fatigue.    Balance Overall balance assessment: Needs assistance Sitting-balance support: Feet supported;Bilateral upper extremity supported Sitting balance-Leahy Scale: Good Sitting balance - Comments: seated EOB   Standing balance support: During functional activity;Bilateral upper extremity supported Standing balance-Leahy Scale: Poor Standing balance comment: deferred                           ADL either performed or assessed with clinical judgement   ADL Overall ADL's : Needs assistance/impaired Eating/Feeding: Set up;Sitting   Grooming: Wash/dry hands;Wash/dry face;Oral care;Set up;Sitting   Upper Body Bathing: Set up;Sitting   Lower Body Bathing: Sit to/from stand;Minimal assistance Lower Body Bathing Details (indicate cue type and reason): Pt declines to stand on OT eval citing fatigue, MIN A to stand per PT note, Pt with difficulty reaching feet in sitting Upper Body Dressing : Set up;Sitting   Lower Body Dressing: Minimal assistance;Sit to/from stand   Toilet Transfer: Minimal assistance;BSC;Grab bars;RW                   Vision Patient Visual Report: No change from baseline       Perception     Praxis      Pertinent Vitals/Pain Pain Assessment: No/denies pain     Hand Dominance     Extremity/Trunk Assessment Upper Extremity Assessment Upper Extremity Assessment: Generalized weakness(R shoulder completes 1/2 arc of motion, L UE completes arc, but cannot sustain against resistance.)   Lower Extremity Assessment Lower Extremity Assessment: Defer to PT evaluation  Cervical / Trunk Assessment Cervical / Trunk Assessment: Normal   Communication Communication Communication: No difficulties   Cognition Arousal/Alertness: Awake/alert Behavior During Therapy: WFL for tasks assessed/performed Overall Cognitive Status: Within  Functional Limits for tasks assessed                                     General Comments       Exercises Other Exercises Other Exercises: OT facilitates education with pt re: role of occupational therapy in general and in acute setting as well as fall prevention strategies-call light use. Pt verbalized understanding, reinforecement would be beneficial.   Shoulder Instructions      Home Living Family/patient expects to be discharged to:: Private residence Living Arrangements: Children;Other relatives(daughter and grandson-reports he is going to college soon) Available Help at Discharge: Family;Friend(s);Available 24 hours/day Type of Home: House Home Access: Ramped entrance     Home Layout: One level     Bathroom Shower/Tub: Teacher, early years/pre: Standard     Home Equipment: Environmental consultant - 2 wheels;Cane - single point;Bedside commode;Wheelchair - manual          Prior Functioning/Environment Level of Independence: Needs assistance  Gait / Transfers Assistance Needed: Pt reports using RW for household distances, requires assistance to get in/out of bed and chairs, endorses hx of falls, but none recent ADL's / Homemaking Assistance Needed: Pt reports daughter assists with bathing, dressing, cooking and cleaning   Comments: Pt reports family does the driving and not on home O2        OT Problem List: Decreased strength;Decreased activity tolerance;Impaired balance (sitting and/or standing);Decreased coordination;Decreased range of motion      OT Treatment/Interventions: Self-care/ADL training;Therapeutic exercise;Energy conservation;DME and/or AE instruction;Therapeutic activities;Patient/family education;Balance training    OT Goals(Current goals can be found in the care plan section) Acute Rehab OT Goals Patient Stated Goal: return home with family to assist OT Goal Formulation: With patient Time For Goal Achievement: 10/24/19 Potential to  Achieve Goals: Good  OT Frequency: Min 1X/week   Barriers to D/C:            Co-evaluation              AM-PAC OT "6 Clicks" Daily Activity     Outcome Measure Help from another person eating meals?: None Help from another person taking care of personal grooming?: A Little Help from another person toileting, which includes using toliet, bedpan, or urinal?: A Little Help from another person bathing (including washing, rinsing, drying)?: A Little Help from another person to put on and taking off regular upper body clothing?: A Little Help from another person to put on and taking off regular lower body clothing?: A Little 6 Click Score: 19   End of Session    Activity Tolerance: Patient tolerated treatment well Patient left: in bed;with call bell/phone within reach;with nursing/sitter in room(CNA in room assisting pt with replacing purewick)  OT Visit Diagnosis: Unsteadiness on feet (R26.81);Muscle weakness (generalized) (M62.81)                Time: 0352-4818 OT Time Calculation (min): 24 min Charges:  OT General Charges $OT Visit: 1 Visit OT Evaluation $OT Eval Moderate Complexity: 1 Mod OT Treatments $Self Care/Home Management : 8-22 mins    Sharren Bridge 10/10/2019, 5:27 PM

## 2019-10-10 NOTE — Evaluation (Signed)
Physical Therapy Evaluation Patient Details Name: [name] MRN: [id number] DOB: 1946/04/17 Today's Date: 10/10/2019   History of Present Illness  [name] is a 73 year old female with history of COPD not on oxygen, DM-2, HTN, HLD, Takotsubo cardiomyopathy, diastolic CHF, LBBB, former tobacco use, CVA, anxiety and depression presenting with recurrent fall and right hip pain and tested positive for COVID-19.  Reportedly patient has been in decline since the loss of her husband few months ago.    Clinical Impression  Pt admitted with above diagnosis. Pt agrees this is her baseline and feels confident that she has the assistance she needs to return home. Pt previously required some assistance getting out of bed/chair and used RW with steadying assistance from family. Pt demonstrates moderate SOB with all OOB activity, on room air and O2 sat 93-98% with mobility. Pt required verbal cues for hand placement and safety to maintain body within RW frame and for controlled descent to sitting and avoiding "plopping" down onto surface. Pt transfers improved with reps, but does require min/mod assist with ambulation due to unsteadiness and RW management. Pt currently with functional limitations due to the deficits listed below (see PT Problem List). Pt will benefit from skilled PT to increase their independence and safety with mobility to allow discharge to the venue listed below.       Follow Up Recommendations SNF(vs HHPT with supervision for mobility OOB)    Equipment Recommendations  None recommended by PT    Recommendations for Other Services       Precautions / Restrictions Precautions Precautions: Fall Restrictions Weight Bearing Restrictions: No      Mobility  Bed Mobility Overal bed mobility: Needs Assistance Bed Mobility: Sit to Supine       Sit to supine: Min assist   General bed mobility comments: requires assist to straighten in bed and scoot up in bed,  increased time and moderately labored with mobility, on RA and O2 sat 93-98%  Transfers Overall transfer level: Needs assistance Equipment used: Rolling walker (2 wheeled) Transfers: Sit to/from [facility] Sit to Stand: Min assist;Min guard Stand pivot transfers: Min assist       General transfer comment: initially min assist with STS, improving to min guard, braces legs on chair, shakiness upon standing improving with time, moderate SOB noted, on RA and O2 sat 93-98%  Ambulation/Gait Ambulation/Gait assistance: Min assist;Mod assist Gait Distance (Feet): 4 Feet Assistive device: Rolling walker (2 wheeled) Gait Pattern/deviations: Step-to pattern;Decreased step length - right;Decreased step length - left;Decreased stride length Gait velocity: decreased   General Gait Details: labored, shuffling steps forward and backward, initially unsteady but improved with time, verbal cues for RW management to improve safety, on RA and O2 sat 93-98%  Stairs            Wheelchair Mobility    Modified Rankin (Stroke Patients Only)       Balance Overall balance assessment: Needs assistance Sitting-balance support: Feet supported;Bilateral upper extremity supported Sitting balance-Leahy Scale: Good Sitting balance - Comments: seated EOB   Standing balance support: During functional activity;Bilateral upper extremity supported Standing balance-Leahy Scale: Poor Standing balance comment: with RW                             Pertinent Vitals/Pain Pain Assessment: No/denies pain    Home Living Family/patient expects to be discharged to:: Private residence Living Arrangements: Children;Other relatives(Daughter and high school aged grandson) Available  Help at Discharge: Family;Friend(s);Available 24 hours/day Type of Home: House Home Access: Ramped entrance     Home Layout: One level Home Equipment: Walker - 2 wheels;Cane - single point;Bedside  commode;Wheelchair - manual      Prior Function Level of Independence: Needs assistance   Gait / Transfers Assistance Needed: Pt reports using RW for household distances, no falls "lately", requires assistance to get in/out of bed and chairs  ADL's / Homemaking Assistance Needed: Pt reports daughter assists with bathing, dressing, cooking and cleaning  Comments: Pt reports family does the driving and not on home O2     Hand Dominance        Extremity/Trunk Assessment   Upper Extremity Assessment Upper Extremity Assessment: Defer to OT evaluation    Lower Extremity Assessment Lower Extremity Assessment: Generalized weakness    Cervical / Trunk Assessment Cervical / Trunk Assessment: Normal  Communication   Communication: No difficulties  Cognition Arousal/Alertness: Awake/alert Behavior During Therapy: WFL for tasks assessed/performed Overall Cognitive Status: Within Functional Limits for tasks assessed                                        General Comments      Exercises     Assessment/Plan    PT Assessment Patient needs continued PT services  PT Problem List Decreased strength;Decreased activity tolerance;Decreased balance;Decreased mobility;Decreased knowledge of use of DME;Cardiopulmonary status limiting activity       PT Treatment Interventions DME instruction;Stair training;Functional mobility training;Therapeutic activities;Therapeutic exercise;Balance training;Neuromuscular re-education;Patient/family education;Manual techniques    PT Goals (Current goals can be found in the Care Plan section)  Acute Rehab PT Goals Patient Stated Goal: return home with family to assist PT Goal Formulation: With patient Time For Goal Achievement: 10/17/19 Potential to Achieve Goals: Good    Frequency Min 3X/week   Barriers to discharge        Co-evaluation               AM-PAC PT "6 Clicks" Mobility  Outcome Measure Help needed turning  from your back to your side while in a flat bed without using bedrails?: A Little Help needed moving from lying on your back to sitting on the side of a flat bed without using bedrails?: A Little Help needed moving to and from a bed to a chair (including a wheelchair)?: A Little Help needed standing up from a chair using your arms (e.g., wheelchair or bedside chair)?: A Little Help needed to walk in hospital room?: A Lot Help needed climbing 3-5 steps with a railing? : A Lot 6 Click Score: 16    End of Session   Activity Tolerance: Patient limited by fatigue Patient left: in bed;with call bell/phone within reach;with bed alarm set Nurse Communication: Mobility status PT Visit Diagnosis: Unsteadiness on feet (R26.81);Muscle weakness (generalized) (M62.81);History of falling (Z91.81)    Time: 9449-6759 PT Time Calculation (min) (ACUTE ONLY): 23 min   Charges:   PT Evaluation $PT Eval Moderate Complexity: 1 Mod PT Treatments $Therapeutic Activity: 23-37 mins         viral bronchitis

## 2019-10-10 NOTE — Progress Notes (Signed)
PROGRESS NOTE  [name] FVC:[id number] DOB: 04-23-1946   PCP: [name], MD  Patient is from: Home.  Lives with daughter.  Uses walker for ambulation at baseline.  DOA: 10/07/2019 LOS: 2  Brief Narrative / Interim history: 73 year old female with history of COPD not on oxygen, DM-2, HTN, HLD, Takotsubo cardiomyopathy, diastolic CHF, LBBB, former tobacco use, CVA, anxiety and depression presenting with recurrent fall and right hip pain and tested positive for COVID-19.  Reportedly patient has been in decline since the loss of her husband few months ago.  In ED, hemodynamically stable and afebrile.  Saturating at 98% on RA.  K2.9.  CO2 18.  Calcium 6.2.  Magnesium 1.4.  Phosphorus 2.1.  Hgb 8.0 (baseline 9-10).  Platelet 143.  UA with nitrites, LE and bacteria.  CT head negative for acute intracranial process but small scalp hematoma over right parietal area.  Hip x-ray negative for acute finding.  CXR without acute finding.  Admitted for recurrent fall, right hip pain, dehydration and physical deconditioning, and tested positive for COVID-19.  Started on remdesivir and Decadron on 10/08/2019.  Subjective: No major events overnight of this morning.  She says she feels well and ready to go home.  No complaints.  She denies chest pain, dyspnea, cough, GI or UTI symptoms.  Denies hip or lower extremity pain.  Saturating in upper 90s on room air.  Objective: Vitals:   10/09/19 1346 10/09/19 2002 10/10/19 0115 10/10/19 0420  BP: 123/65 (!) 121/54 (!) 118/54 (!) 127/52  Pulse: 74 81 67 62  Resp: 16 16  16   Temp:  98.1 F (36.7 C) 97.9 F (36.6 C) 97.9 F (36.6 C)  TempSrc:  Oral Oral Oral  SpO2: 97% 94%  96%  Weight:      Height:        Intake/Output Summary (Last 24 hours) at 10/10/2019 1412 Last data filed at 10/10/2019 0420 Gross per 24 hour  Intake 460 ml  Output 575 ml  Net -115 ml   Filed Weights   10/07/19 1918  Weight: 63 kg    Examination:  GENERAL:  No acute distress.  Appears well.  HEENT: MMM.  Vision and hearing grossly intact.  NECK: Supple.  No apparent JVD.  RESP:  No IWOB.  Fair aeration bilaterally. CVS:  RRR. Heart sounds normal.  ABD/GI/GU: Bowel sounds present. Soft. Non tender.  MSK/EXT:  Moves extremities. No apparent deformity or edema.  SKIN: no apparent skin lesion or wound NEURO: Awake, alert and oriented x4..  No apparent focal neuro deficit. PSYCH: Calm. Normal affect.  Procedures:  None  Assessment & Plan: Recurrent/physical deconditioning/right hip pain: reportedly she has been in decline since she lost her husband.  No prodrome leading to fall.  Dehydration and COVID-19 infection could have played a role.  CT head and hip x-ray without acute finding other than small scalp hematoma.  Currently pain-free.  No focal tenderness or pain with lower extremity movement.  -Fall precautions and PT/OT-recommended SNF. -Treat possible treatable causes as below.  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  Resolved.  Metabolic acidosis: Likely due to IV fluid.  Improved off IV fluid. -Encourage oral hydration  Hypokalemia/hypomagnesemia/hypophosphatemia: Resolved. -Continue monitoring  Anemia of chronic disease: Stable. -Hgb 9-10 (baseline)> 10.9 (admit)> 9.0> 9.5 -Check anemia panel -Continue ferrous sulfate  Pyuria/bacteriuria: Patient without UTI symptoms. -No treatment recommended.  Uncontrolled DM-2 with hyperglycemia: A1c 5.1% on 08/29/2019.  Hyperglycemia likely due to steroid. Recent Labs  10/09/19 2010 10/10/19 0822 10/10/19 1115  GLUCAP 222* 203* 230*  -Change SSI to moderate -Increased NovoLog 3 to 6 units units AC -Continue Tradjenta 5 mg daily. -Continue Lipitor  Essential hypertension: Normotensive. -Continue home metoprolol.  Chronic diastolic CHF/history of Takotsubo cardiomyopathy: Appears euvolemic.  No cardiopulmonary symptoms.  Not on diuretics at  home. -Monitor fluid status  History of lung cancer s/p chemoradiation in remission -Outpatient follow-up  COVID-19 infection: Incidentally tested positive.  Has no respiratory or GI symptoms yet.  Inflammatory markers improving.  CXR negative.  On room air. Recent Labs    10/08/19 1916 10/08/19 1917 10/09/19 0342 10/10/19 0321  DDIMER  --  2.69* 3.49* 2.33*  FERRITIN 86  --   --   --   CRP 14.7*  --  16.3* 11.5*  -Continue Decadron and remdesivir 12/15>> -Continue inhalers, incentive spirometry -OOB  History of CVA: Stable.  No focal neuro deficit. -Continue Lipitor.  Chronic COPD: Stable.  No respiratory symptoms despite Covid infection. -Continue inhalers and supportive care -Treat Covid as above.  Depression/anxiety: Stable -Continue Wellbutrin and Lexapro                   DVT prophylaxis: Subcu Lovenox Code Status: Full code Family Communication: Updated patient's daughter over the phone from patient's phone. Disposition Plan: Remains inpatient.  Final disposition SNF Consultants: None   Microbiology summarized: [id number] positive.  Sch Meds:  Scheduled Meds: . aspirin EC  81 mg Oral Daily  . atorvastatin  40 mg Oral Daily  . buPROPion  150 mg Oral Daily  . dexamethasone  6 mg Oral Q24H  . enoxaparin (LOVENOX) injection  40 mg Subcutaneous QHS  . escitalopram  20 mg Oral Daily  . insulin aspart  0-5 Units Subcutaneous QHS  . insulin aspart  0-9 Units Subcutaneous TID WC  . insulin aspart  3 Units Subcutaneous TID WC  . linagliptin  5 mg Oral Daily  . loratadine  10 mg Oral Daily  . metoprolol tartrate  25 mg Oral Daily  . metoprolol tartrate  50 mg Oral QHS  . pantoprazole  40 mg Oral Daily  . sodium chloride flush  3 mL Intravenous Once   Continuous Infusions: . remdesivir 100 mg in NS 100 mL 100 mg (10/10/19 0910)   PRN Meds:.acetaminophen **OR** acetaminophen, albuterol, guaiFENesin-dextromethorphan, ondansetron **OR** ondansetron  (ZOFRAN) IV, polyethylene glycol  Antimicrobials: Anti-infectives (From admission, onward)   Start     Dose/Rate Route Frequency Ordered Stop   10/09/19 1000  remdesivir 100 mg in sodium chloride 0.9 % 100 mL IVPB     100 mg 200 mL/hr over 30 Minutes Intravenous Daily 10/08/19 1804 10/13/19 0959   10/08/19 1930  remdesivir 200 mg in sodium chloride 0.9% 250 mL IVPB     200 mg 580 mL/hr over 30 Minutes Intravenous Once 10/08/19 1804 10/08/19 2102       I have personally reviewed the following labs and images: CBC: Recent Labs  Lab 10/08/19 0131 10/08/19 0449 10/08/19 1917 10/09/19 0342 10/10/19 0321  WBC 9.7 7.4 11.1* 7.2 11.1*  NEUTROABS  --  6.1 8.8* 6.4 10.2*  HGB 9.9* 8.0* 10.0* 9.0* 9.5*  HCT 31.8* 26.3* 32.0* 31.0* 31.0*  MCV 88.6 89.5 87.9 92.3 89.9  PLT 188 143* 219 181 289   BMP &GFR Recent Labs  Lab 10/07/19 1941 10/08/19 0131 10/08/19 0449 10/08/19 1917 10/09/19 0342 10/10/19 0321  NA 130*  --  136 131* 133* 135  K 5.3*  --  2.9* 5.1 4.9 4.6  CL 95*  --  113* 101 106 105  CO2 24  --  18* 23 19* 21*  GLUCOSE 117*  --  86 148* 280* 219*  BUN 25*  --  17 15 14 20   CREATININE 1.27* 1.02* 0.76 0.96 0.80 0.87  CALCIUM 8.9  --  6.2* 8.0* 7.7* 8.2*  MG  --   --  1.4*  --  2.7* 2.3  PHOS  --   --  2.1*  --  2.8 2.2*   Estimated Creatinine Clearance: 51.2 mL/min (by C-G formula based on SCr of 0.87 mg/dL). Liver & Pancreas: Recent Labs  Lab 10/07/19 1941 10/08/19 1917 10/09/19 0342 10/10/19 0321  AST 64* 44* 23 21  ALT 28 22 19 20   ALKPHOS 70 63 65 68  BILITOT 1.4* 1.5* 0.4 0.4  PROT 7.7 6.3* 6.0* 6.2*  ALBUMIN 3.7 3.0* 2.7* 2.7*   No results for input(s): LIPASE, AMYLASE in the last 168 hours. No results for input(s): AMMONIA in the last 168 hours. Diabetic: No results for input(s): HGBA1C in the last 72 hours. Recent Labs  Lab 10/09/19 1204 10/09/19 1543 10/09/19 2010 10/10/19 0822 10/10/19 1115  GLUCAP 209* 119* 222* 203* 230*    Cardiac Enzymes: No results for input(s): CKTOTAL, CKMB, CKMBINDEX, TROPONINI in the last 168 hours. No results for input(s): PROBNP in the last 8760 hours. Coagulation Profile: No results for input(s): INR, PROTIME in the last 168 hours. Thyroid Function Tests: No results for input(s): TSH, T4TOTAL, FREET4, T3FREE, THYROIDAB in the last 72 hours. Lipid Profile: No results for input(s): CHOL, HDL, LDLCALC, TRIG, CHOLHDL, LDLDIRECT in the last 72 hours. Anemia Panel: Recent Labs    10/08/19 1916  FERRITIN 86   Urine analysis:    Component Value Date/Time   COLORURINE YELLOW 10/08/2019 0437   APPEARANCEUR HAZY (A) 10/08/2019 0437   LABSPEC 1.013 10/08/2019 0437   PHURINE 5.0 10/08/2019 0437   GLUCOSEU NEGATIVE 10/08/2019 0437   HGBUR SMALL (A) 10/08/2019 0437   BILIRUBINUR NEGATIVE 10/08/2019 0437   KETONESUR NEGATIVE 10/08/2019 0437   PROTEINUR 30 (A) 10/08/2019 0437   UROBILINOGEN 1.0 02/03/2013 0912   NITRITE POSITIVE (A) 10/08/2019 0437   LEUKOCYTESUR LARGE (A) 10/08/2019 0437   Sepsis Labs: Invalid input(s): PROCALCITONIN, [place]  Microbiology: Recent Results (from the past 240 hour(s))  SARS CORONAVIRUS 2 (TAT 6-24 HRS) Nasopharyngeal Nasopharyngeal Swab     Status: Abnormal   Collection Time: 10/07/19 11:36 PM   Specimen: Nasopharyngeal Swab  Result Value Ref Range Status   SARS Coronavirus 2 POSITIVE (A) NEGATIVE Final    Comment: RESULT CALLED TO, READ BACK BY AND VERIFIED WITH: Z TEETERS,RN 1537 10/08/2019 D BRADLEY (NOTE) SARS-CoV-2 target nucleic acids are DETECTED. The SARS-CoV-2 RNA is generally detectable in upper and lower respiratory specimens during the acute phase of infection. Positive results are indicative of the presence of SARS-CoV-2 RNA. Clinical correlation with patient history and other diagnostic information is  necessary to determine patient infection status. Positive results do not rule out bacterial infection or co-infection with  other viruses.  The expected result is Negative. Fact Sheet for Patients: SugarRoll.be Fact Sheet for Healthcare Providers: https://www.woods-mathews.com/ This test is not yet approved or cleared by the [place] FDA and  has been authorized for detection and/or diagnosis of SARS-CoV-2 by FDA under an Emergency Use Authorization (EUA). This EUA will remain  in effect (meaning this test can be used) for  the duration of the COVID-19  declaration under Section 564(b)(1) of the Act, 21 U.S.C. section 360bbb-3(b)(1), unless the authorization is terminated or revoked sooner. Performed at [facility], [place] [address] [place], [place] [address]     Radiology Studies: No results found.    Saraya Tirey T. Isola  If 7PM-7AM, please contact night-coverage [email or web address] Password [email or web address] 10/10/2019, 2:12 PM

## 2019-10-11 ENCOUNTER — Other Ambulatory Visit: Payer: Self-pay | Admitting: *Deleted

## 2019-10-11 LAB — CBC WITH DIFFERENTIAL/PLATELET
Abs Immature Granulocytes: 0.07 10*3/uL (ref 0.00–0.07)
Basophils Absolute: 0 10*3/uL (ref 0.0–0.1)
Basophils Relative: 0 %
Eosinophils Absolute: 0 10*3/uL (ref 0.0–0.5)
Eosinophils Relative: 0 %
HCT: 33.7 % — ABNORMAL LOW (ref 36.0–46.0)
Hemoglobin: 10.2 g/dL — ABNORMAL LOW (ref 12.0–15.0)
Immature Granulocytes: 1 %
Lymphocytes Relative: 5 %
Lymphs Abs: 0.5 10*3/uL — ABNORMAL LOW (ref 0.7–4.0)
MCH: 27 pg (ref 26.0–34.0)
MCHC: 30.3 g/dL (ref 30.0–36.0)
MCV: 89.2 fL (ref 80.0–100.0)
Monocytes Absolute: 0.7 10*3/uL (ref 0.1–1.0)
Monocytes Relative: 6 %
Neutro Abs: 10.1 10*3/uL — ABNORMAL HIGH (ref 1.7–7.7)
Neutrophils Relative %: 88 %
Platelets: 285 10*3/uL (ref 150–400)
RBC: 3.78 MIL/uL — ABNORMAL LOW (ref 3.87–5.11)
RDW: 18.4 % — ABNORMAL HIGH (ref 11.5–15.5)
WBC: 11.4 10*3/uL — ABNORMAL HIGH (ref 4.0–10.5)
nRBC: 0 % (ref 0.0–0.2)

## 2019-10-11 LAB — COMPREHENSIVE METABOLIC PANEL
ALT: 20 U/L (ref 0–44)
AST: 21 U/L (ref 15–41)
Albumin: 2.9 g/dL — ABNORMAL LOW (ref 3.5–5.0)
Alkaline Phosphatase: 69 U/L (ref 38–126)
Anion gap: 9 (ref 5–15)
BUN: 21 mg/dL (ref 8–23)
CO2: 21 mmol/L — ABNORMAL LOW (ref 22–32)
Calcium: 8.5 mg/dL — ABNORMAL LOW (ref 8.9–10.3)
Chloride: 104 mmol/L (ref 98–111)
Creatinine, Ser: 0.88 mg/dL (ref 0.44–1.00)
GFR calc Af Amer: >60 mL/min (ref >60)
GFR calc non Af Amer: >60 mL/min (ref >60)
Glucose, Bld: 230 mg/dL — ABNORMAL HIGH (ref 70–99)
Potassium: 5.1 mmol/L (ref 3.5–5.1)
Sodium: 134 mmol/L — ABNORMAL LOW (ref 135–145)
Total Bilirubin: 0.5 mg/dL (ref 0.3–1.2)
Total Protein: 6.3 g/dL — ABNORMAL LOW (ref 6.5–8.1)

## 2019-10-11 LAB — FOLATE: Folate: 14.2 ng/mL (ref >5.9)

## 2019-10-11 LAB — RETICULOCYTES
Immature Retic Fract: 12.8 % (ref 2.3–15.9)
RBC.: 3.95 MIL/uL (ref 3.87–5.11)
Retic Count, Absolute: 23.7 10*3/uL (ref 19.0–186.0)
Retic Ct Pct: 0.6 % (ref 0.4–3.1)

## 2019-10-11 LAB — FERRITIN: Ferritin: 86 ng/mL (ref 11–307)

## 2019-10-11 LAB — VITAMIN B12: Vitamin B-12: 1454 pg/mL — ABNORMAL HIGH (ref 180–914)

## 2019-10-11 LAB — PHOSPHORUS: Phosphorus: 2.1 mg/dL — ABNORMAL LOW (ref 2.5–4.6)

## 2019-10-11 LAB — IRON AND TIBC
Iron: 44 ug/dL (ref 28–170)
Saturation Ratios: 19 % (ref 10.4–31.8)
TIBC: 236 ug/dL — ABNORMAL LOW (ref 250–450)
UIBC: 192 ug/dL

## 2019-10-11 LAB — GLUCOSE, CAPILLARY
Glucose-Capillary: 200 mg/dL — ABNORMAL HIGH (ref 70–99)
Glucose-Capillary: 151 mg/dL — ABNORMAL HIGH (ref 70–99)
Glucose-Capillary: 68 mg/dL — ABNORMAL LOW (ref 70–99)
Glucose-Capillary: 80 mg/dL (ref 70–99)
Glucose-Capillary: 101 mg/dL — ABNORMAL HIGH (ref 70–99)

## 2019-10-11 LAB — D-DIMER, QUANTITATIVE: D-Dimer, Quant: 2.33 ug/mL-FEU — ABNORMAL HIGH (ref 0.00–0.50)

## 2019-10-11 LAB — C-REACTIVE PROTEIN: CRP: 7.4 mg/dL — ABNORMAL HIGH (ref <1.0)

## 2019-10-11 LAB — MAGNESIUM: Magnesium: 2.1 mg/dL (ref 1.7–2.4)

## 2019-10-11 MED ORDER — SODIUM PHOSPHATES 45 MMOLE/15ML IV SOLN
10.0000 mmol | Freq: Once | INTRAVENOUS | Status: AC
  Administered 2019-10-11: 10 mmol via INTRAVENOUS
  Filled 2019-10-11: qty 3.33

## 2019-10-11 MED ORDER — INSULIN ASPART 100 UNIT/ML ~~LOC~~ SOLN
4.0000 [IU] | Freq: Three times a day (TID) | SUBCUTANEOUS | Status: DC
  Administered 2019-10-11: 4 [IU] via SUBCUTANEOUS

## 2019-10-11 NOTE — Progress Notes (Signed)
Results for CHAUNTELLE, AZPEITIA (MRN [id number]) as of 10/11/2019 09:08  Ref. Range 10/10/2019 11:15 10/10/2019 16:22 10/10/2019 21:06 10/10/2019 21:44 10/11/2019 08:03  Glucose-Capillary Latest Ref Range: 70 - 99 mg/dL 230 (H) 135 (H) 56 (L) 71 200 (H)  Noted that patient had a CBG of 56 mg/dl last night. Had received Novolog total of 8 units for CBG of 135 mg/dl with correction scale and meal coverage.   Recommend decreasing Novolog meal coverage to 3 units TID. Patient only receives meal coverage if eating at 50 % of meal and blood sugar is not less than 80 mg/dl. If blood sugars continue to be less than 70 mg/dl, recommend decreasing Novolog correction scale to SENSITIVE TID & HS scale OR discontinue Novolog meal coverage.  Harvel Ricks RN BSN CDE Diabetes Coordinator Pager: [phone]  8am-5pm

## 2019-10-11 NOTE — TOC Progression Note (Signed)
Transition of Care [facility]) - Progression Note    Patient Details  Name: [name] MRN: [id number] Date of Birth: May 14, 1946  [facility] CM/SW Contact  Purcell Mouton, RN Phone Number: 10/11/2019, 3:27 PM  Clinical Narrative:     Spoke with pt concerning discharge plans to home or SNF. Pt selected SNF.        Expected Discharge Plan and Services                                                 Social Determinants of Health (SDOH) Interventions    Readmission Risk Interventions No flowsheet data found.

## 2019-10-11 NOTE — NC FL2 (Signed)
[place] LEVEL OF CARE SCREENING TOOL     IDENTIFICATION  Patient Name: [name] Birthdate: Oct 11, 1946 Sex: female Admission Date (Current Location): 10/07/2019  [facility] [place] Number:  Herbalist and Address:         Provider Number: [id number]  Attending Physician Name and Address:  Mercy Riding, MD  Relative Name and Phone Number:  Nolberto Hanlon [phone]    Current Level of Care: Hospital Recommended Level of Care: [place] Prior Approval Number:    Date Approved/Denied:   PASRR Number: [id number]  Discharge Plan: SNF    Current Diagnoses: Patient Active Problem List   Diagnosis Date Noted  . Recurrent falls 10/08/2019  . Hypocalcemia 10/08/2019  . Preoperative cardiovascular examination 01/12/2017  . Diarrhea 12/02/2016  . CAP (community acquired pneumonia) 12/01/2016  . Essential hypertension 12/01/2016  . Chronic diastolic (congestive) heart failure ([place]) 12/01/2016  . Cerebrovascular accident (CVA) ([place])   . Hypertension associated with chronic kidney disease due to type 2 diabetes mellitus ([place]) 09/17/2016  . Cardiac arrest with ventricular fibrillation ([place]) 09/14/2016  . Takotsubo cardiomyopathy 09/14/2016  . Colonic diverticular abscess   . Diverticulitis of large intestine with perforation and abscess 09/11/2016  . Controlled type 2 diabetes mellitus with hyperglycemia, with long-term current use of insulin ([place]) 06/14/2016  . Hyperlipidemia 06/14/2016  . COPD exacerbation ([place]) 10/18/2013  . Non-occlusive coronary artery disease 06/06/2012  . Allergic rhinitis 01/06/2011  . TOBACCO USE, QUIT 07/29/2009  . Uncontrolled type 2 diabetes mellitus with complication ([place]) 46/80/3212  . GERD (gastroesophageal reflux disease) 04/24/2007    Orientation RESPIRATION BLADDER Height & Weight     Self, Time, Situation, Place  Normal Continent Weight: 63 kg Height:  5\' 4"  (162.6 cm)   BEHAVIORAL SYMPTOMS/MOOD NEUROLOGICAL BOWEL NUTRITION STATUS      Continent Diet(Heart Healthy)  AMBULATORY STATUS COMMUNICATION OF NEEDS Skin   Limited Assist   Normal                       Personal Care Assistance Level of Assistance  Bathing, Feeding, Dressing Bathing Assistance: Limited assistance Feeding assistance: Independent Dressing Assistance: Limited assistance     Functional Limitations Info  Sight, Hearing, Speech Sight Info: Adequate Hearing Info: Adequate Speech Info: Adequate    SPECIAL CARE FACTORS FREQUENCY                       Contractures Contractures Info: Not present    Additional Factors Info  Code Status, Allergies Code Status Info: FULL Allergies Info: Lactose Intolerance  GI, Ibuprofen           Current Medications (10/11/2019):  This is the current hospital active medication list Current Facility-Administered Medications  Medication Dose Route Frequency Provider Last Rate Last Admin  . acetaminophen (TYLENOL) tablet 650 mg  650 mg Oral Q6H PRN Adele Barthel D, MD       Or  . acetaminophen (TYLENOL) suppository 650 mg  650 mg Rectal Q6H PRN Adele Barthel D, MD      . albuterol (VENTOLIN HFA) 108 (90 Base) MCG/ACT inhaler 2 puff  2 puff Inhalation Q4H PRN Adele Barthel D, MD      . aspirin EC tablet 81 mg  81 mg Oral Daily Adele Barthel D, MD   81 mg at 10/11/19 0945  . atorvastatin (LIPITOR) tablet 40 mg  40 mg Oral Daily Blain Pais, MD  40 mg at 10/11/19 0945  . buPROPion (WELLBUTRIN XL) 24 hr tablet 150 mg  150 mg Oral Daily Adele Barthel D, MD   150 mg at 10/11/19 1015  . dexamethasone (DECADRON) tablet 6 mg  6 mg Oral Q24H Adele Barthel D, MD   6 mg at 10/10/19 2116  . enoxaparin (LOVENOX) injection 40 mg  40 mg Subcutaneous QHS Adele Barthel D, MD   40 mg at 10/10/19 2111  . escitalopram (LEXAPRO) tablet 20 mg  20 mg Oral Daily Adele Barthel D, MD   20 mg at 10/11/19 0930  .  guaiFENesin-dextromethorphan (ROBITUSSIN DM) 100-10 MG/5ML syrup 10 mL  10 mL Oral Q4H PRN Adele Barthel D, MD      . influenza vaccine adjuvanted (FLUAD) injection 0.5 mL  0.5 mL Intramuscular Tomorrow-1000 Gonfa, Taye T, MD      . insulin aspart (novoLOG) injection 0-15 Units  0-15 Units Subcutaneous TID WC Mercy Riding, MD   3 Units at 10/11/19 1240  . insulin aspart (novoLOG) injection 0-5 Units  0-5 Units Subcutaneous QHS Gonfa, Taye T, MD      . insulin aspart (novoLOG) injection 4 Units  4 Units Subcutaneous TID WC Gonfa, Taye T, MD      . linagliptin (TRADJENTA) tablet 5 mg  5 mg Oral Daily Gonfa, Taye T, MD   5 mg at 10/11/19 1045  . loratadine (CLARITIN) tablet 10 mg  10 mg Oral Daily Adele Barthel D, MD   10 mg at 10/11/19 0945  . metoprolol tartrate (LOPRESSOR) tablet 25 mg  25 mg Oral Daily Phineas Semen, MD   25 mg at 10/11/19 0945  . metoprolol tartrate (LOPRESSOR) tablet 50 mg  50 mg Oral QHS Phineas Semen, MD   50 mg at 10/10/19 2115  . ondansetron (ZOFRAN) tablet 4 mg  4 mg Oral Q6H PRN Blain Pais, MD       Or  . ondansetron (ZOFRAN) injection 4 mg  4 mg Intravenous Q6H PRN Adele Barthel D, MD      . pantoprazole (PROTONIX) EC tablet 40 mg  40 mg Oral Daily Adele Barthel D, MD   40 mg at 10/11/19 1045  . pneumococcal 23 valent vaccine (PNEUMOVAX-23) injection 0.5 mL  0.5 mL Intramuscular Tomorrow-1000 Gonfa, Taye T, MD      . polyethylene glycol (MIRALAX / GLYCOLAX) packet 17 g  17 g Oral Daily PRN Adele Barthel D, MD      . remdesivir 100 mg in sodium chloride 0.9 % 100 mL IVPB  100 mg Intravenous Daily Adele Barthel D, MD 200 mL/hr at 10/11/19 1015 100 mg at 10/11/19 1015  . sodium chloride flush (NS) 0.9 % injection 3 mL  3 mL Intravenous Once Blain Pais, MD   Stopped at 10/08/19 [phone]  . sodium phosphate 10 mmol in dextrose 5 % 250 mL infusion  10 mmol Intravenous Once Mercy Riding, MD         Discharge  Medications: Please see discharge summary for a list of discharge medications.  Relevant Imaging Results:  Relevant Lab Results:   Additional Information SS [id number]  Purcell Mouton, RN

## 2019-10-11 NOTE — Patient Outreach (Signed)
[facility]) Care Management  10/11/2019  [name] 1946-05-06 [id number]   [place] Discipline closure. Health Coach services will no longer be involved with the referenced patient  Patient has been admitted into hospital. Patient will be followed by Complex Care Coordinator.   Plan RN Health Coach case closure Discipline closure letter to PCP  [place] Management [phone]

## 2019-10-11 NOTE — Progress Notes (Signed)
Physical Therapy Treatment Patient Details Name: [name] MRN: [id number] DOB: 1945-11-15 Today's Date: 10/11/2019    History of Present Illness [name] is a 73 year old female with history of COPD not on oxygen, DM-2, HTN, HLD, Takotsubo cardiomyopathy, diastolic CHF, LBBB, former tobacco use, CVA, anxiety and depression presenting with recurrent fall and right hip pain and tested positive for COVID-19.  Reportedly patient has been in decline since the loss of her husband few months ago.    PT Comments    Pt very pleasant and AxO x 3.  Assisted OOB to amb. General transfer comment: Severe posterior lean and LOB backward 3 times during session with turns and back gait to [facility].  Marland Kitchen General Gait Details: poor forward flex posture and posterior LOB 3 times during session.  Would NOT advise pt amb on her own.  Balance is poor and reaction response to correct is poor.  HIGH FALL RISK esp with turns and backward gait.  Pt would benefit from ST rehab prior to D/C back home.   Follow Up Recommendations  SNF     Equipment Recommendations  None recommended by PT    Recommendations for Other Services       Precautions / Restrictions Precautions Precautions: Fall Restrictions Weight Bearing Restrictions: No Other Position/Activity Restrictions: COVID precautions    Mobility  Bed Mobility Overal bed mobility: Needs Assistance Bed Mobility: Supine to Sit;Sit to Supine     Supine to sit: Supervision Sit to supine: Supervision;Min guard   General bed mobility comments: increased time, hand hold, use of bed rail, HOB elevated but self able  Transfers Overall transfer level: Needs assistance Equipment used: Rolling walker (2 wheeled) Transfers: Sit to/from [facility] Sit to Stand: Min assist;Min guard Stand pivot transfers: Min assist       General transfer comment: Severe posterior lean and LOB backward 3 times during session with turns and back  gait to [facility].  Marland Kitchen  Ambulation/Gait Ambulation/Gait assistance: Min assist;Min guard Gait Distance (Feet): 22 Feet(around the room) Assistive device: Rolling walker (2 wheeled) Gait Pattern/deviations: Step-to pattern;Decreased step length - right;Decreased step length - left;Decreased stride length Gait velocity: decreased   General Gait Details: poor forward flex posture and posterior LOB 3 times during session.  Would NOT advise pt amb on her own.   Stairs             Wheelchair Mobility    Modified Rankin (Stroke Patients Only)       Balance                                            Cognition Arousal/Alertness: Awake/alert Behavior During Therapy: WFL for tasks assessed/performed Overall Cognitive Status: Within Functional Limits for tasks assessed                                 General Comments: very pleasant and funny.  Cute.      Exercises      General Comments        Pertinent Vitals/Pain Pain Assessment: No/denies pain    Home Living                      Prior Function            PT Goals (current  goals can now be found in the care plan section) Progress towards PT goals: Progressing toward goals    Frequency    Min 3X/week      PT Plan Current plan remains appropriate    Co-evaluation              AM-PAC PT "6 Clicks" Mobility   Outcome Measure  Help needed turning from your back to your side while in a flat bed without using bedrails?: A Little Help needed moving from lying on your back to sitting on the side of a flat bed without using bedrails?: A Little Help needed moving to and from a bed to a chair (including a wheelchair)?: A Little Help needed standing up from a chair using your arms (e.g., wheelchair or bedside chair)?: A Lot Help needed to walk in hospital room?: A Lot Help needed climbing 3-5 steps with a railing? : A Lot 6 Click Score: 15    End of Session Equipment  Utilized During Treatment: Gait belt Activity Tolerance: Patient limited by fatigue Patient left: in bed;with call bell/phone within reach;with bed alarm set Nurse Communication: Mobility status PT Visit Diagnosis: Unsteadiness on feet (R26.81);Muscle weakness (generalized) (M62.81);History of falling (Z91.81)     Time: 3846-6599 PT Time Calculation (min) (ACUTE ONLY): 30 min  Charges:  $Gait Training: 8-22 mins $Therapeutic Activity: 8-22 mins                     {Joaquim Tolen  PTA Acute  Rehabilitation Services Pager      [phone] Office      [phone]

## 2019-10-11 NOTE — Progress Notes (Signed)
PROGRESS NOTE  [name] MEQ:[id number] DOB: Jan 08, 1946   PCP: [name], MD  Patient is from: Home.  Lives with daughter.  Uses walker for ambulation at baseline.  DOA: 10/07/2019 LOS: 3  Brief Narrative / Interim history: 73 year old female with history of COPD not on oxygen, DM-2, HTN, HLD, Takotsubo cardiomyopathy, diastolic CHF, LBBB, former tobacco use, CVA, anxiety and depression presenting with recurrent fall and right hip pain and tested positive for COVID-19.  Reportedly patient has been in decline since the loss of her husband few months ago.  In ED, hemodynamically stable and afebrile.  Saturating at 98% on RA.  K2.9.  CO2 18.  Calcium 6.2.  Magnesium 1.4.  Phosphorus 2.1.  Hgb 8.0 (baseline 9-10).  Platelet 143.  UA with nitrites, LE and bacteria.  CT head negative for acute intracranial process but small scalp hematoma over right parietal area.  Hip x-ray negative for acute finding.  CXR without acute finding.  Admitted for recurrent fall, right hip pain, dehydration and physical deconditioning, and tested positive for COVID-19.  Started on remdesivir and Decadron on 10/08/2019.  No significant respiratory symptoms.  Remained stable on room air.  Inflammatory markers improved.  Evaluated by PT/OT who recommended SNF  Subjective: No major events overnight of this morning.  No complaints.  She says she feels fine.  Denies chest pain, shortness of breath, GI or UTI symptoms.  Reports intermittent cough.  Therapy recommended SNF.  She says she would like to talk to her daughter about rehab.  Objective: Vitals:   10/10/19 0420 10/10/19 1629 10/10/19 2109 10/11/19 0532  BP: (!) 127/52 (!) 117/48 124/66 129/60  Pulse: 62 65 77 63  Resp: 16 19 18 20   Temp: 97.9 F (36.6 C) 98.4 F (36.9 C) 98 F (36.7 C) 97.6 F (36.4 C)  TempSrc: Oral Oral Oral Oral  SpO2: 96% 100% 99% 97%  Weight:      Height:        Intake/Output Summary (Last 24 hours) at 10/11/2019  1349 Last data filed at 10/11/2019 0959 Gross per 24 hour  Intake 1300 ml  Output 1500 ml  Net -200 ml   Filed Weights   10/07/19 1918  Weight: 63 kg    Examination:  GENERAL: No acute distress.  Appears well.  HEENT: MMM.  Vision and hearing grossly intact.  NECK: Supple.  No apparent JVD.  RESP:  No IWOB.  Fair aeration bilaterally. CVS:  RRR. Heart sounds normal.  ABD/GI/GU: Bowel sounds present. Soft. Non tender.  MSK/EXT:  Moves extremities. No apparent deformity or edema.  SKIN: no apparent skin lesion or wound NEURO: Awake, alert and oriented x4..  No apparent focal neuro deficit. PSYCH: Calm. Normal affect  Procedures:  None  Assessment & Plan: Recurrent/physical deconditioning/right hip pain: reportedly she has been in decline since she lost her husband.  No prodrome leading to fall.  Dehydration and COVID-19 infection could have played a role.  CT head and hip x-ray without acute finding other than small scalp hematoma.  Currently pain-free.  No focal tenderness or pain with lower extremity movement.  -Fall precautions and PT/OT-recommended SNF-daughter who is in agreement after discussion with patient. 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  Stable -Continue monitoring.  Metabolic acidosis: Likely due to IV fluid.  Improved off IV fluid. -Encourage oral hydration  Hypokalemia/hypomagnesemia/hypophosphatemia: Improved. -Sodium phosphates 10 mmol x 1  Anemia of chronic disease: Stable. -Hgb 9-10 (  baseline)> 10.9 (admit)> 9.0> 9.5> 10.2 -Check anemia panel -Continue ferrous sulfate  Pyuria/bacteriuria: Patient without UTI symptoms. -No treatment recommended.  Uncontrolled DM-2 with hyperglycemia: A1c 5.1% on 08/29/2019.  Hyperglycemia likely due to steroid. Recent Labs    10/10/19 2144 10/11/19 0803 10/11/19 1152  GLUCAP 71 200* 151*  -Continue SSI to moderate, Tradjenta and Lipitor.  -Reduce NovoLog from 6 to 4 units AC.  Essential hypertension: Normotensive. -Continue home metoprolol.  Chronic diastolic CHF/history of Takotsubo cardiomyopathy: Appears euvolemic.  No cardiopulmonary symptoms.  Not on diuretics at home. -Monitor fluid status  History of lung cancer s/p chemoradiation in remission -Outpatient follow-up  COVID-19 infection: Incidentally tested positive.  Has no respiratory or GI symptoms yet.  Inflammatory markers improving.  CXR negative.  On room air. Recent Labs    10/08/19 1916 10/09/19 0342 10/10/19 0321 10/11/19 0320  DDIMER  --  3.49* 2.33* 2.33*  FERRITIN 86  --   --   --   CRP 14.7* 16.3* 11.5* 7.4*  -Continue Decadron and remdesivir 12/15>> -Continue inhalers, incentive spirometry -OOB  History of CVA: Stable.  No focal neuro deficit. -Continue Lipitor.  Chronic COPD: Stable.  No respiratory symptoms despite Covid infection. -Continue inhalers and supportive care -Treat Covid as above.  Depression/anxiety: Stable -Continue Wellbutrin and Lexapro                   DVT prophylaxis: Subcu Lovenox Code Status: Full code Family Communication: Updated patient's daughter over the phone. Disposition Plan: Remains inpatient.  Final disposition SNF Consultants: None   Microbiology summarized: GUYQI-34 positive.  Sch Meds:  Scheduled Meds: . aspirin EC  81 mg Oral Daily  . atorvastatin  40 mg Oral Daily  . buPROPion  150 mg Oral Daily  . dexamethasone  6 mg Oral Q24H  . enoxaparin (LOVENOX) injection  40 mg Subcutaneous QHS  . escitalopram  20 mg Oral Daily  . influenza vaccine adjuvanted  0.5 mL Intramuscular Tomorrow-1000  . insulin aspart  0-15 Units Subcutaneous TID WC  . insulin aspart  0-5 Units Subcutaneous QHS  . insulin aspart  6 Units Subcutaneous TID WC  . linagliptin  5 mg Oral Daily  . loratadine  10 mg Oral Daily  . metoprolol tartrate  25 mg Oral Daily  . metoprolol tartrate  50 mg Oral QHS  .  pantoprazole  40 mg Oral Daily  . pneumococcal 23 valent vaccine  0.5 mL Intramuscular Tomorrow-1000  . sodium chloride flush  3 mL Intravenous Once   Continuous Infusions: . remdesivir 100 mg in NS 100 mL 100 mg (10/11/19 1015)  . sodium phosphate  Dextrose 5% IVPB     PRN Meds:.acetaminophen **OR** acetaminophen, albuterol, guaiFENesin-dextromethorphan, ondansetron **OR** ondansetron (ZOFRAN) IV, polyethylene glycol  Antimicrobials: Anti-infectives (From admission, onward)   Start     Dose/Rate Route Frequency Ordered Stop   10/09/19 1000  remdesivir 100 mg in sodium chloride 0.9 % 100 mL IVPB     100 mg 200 mL/hr over 30 Minutes Intravenous Daily 10/08/19 1804 10/13/19 0959   10/08/19 1930  remdesivir 200 mg in sodium chloride 0.9% 250 mL IVPB     200 mg 580 mL/hr over 30 Minutes Intravenous Once 10/08/19 1804 10/08/19 2102       I have personally reviewed the following labs and images: CBC: Recent Labs  Lab 10/08/19 0449 10/08/19 1917 10/09/19 0342 10/10/19 0321 10/11/19 0320  WBC 7.4 11.1* 7.2 11.1* 11.4*  NEUTROABS 6.1 8.8* 6.4 10.2* 10.1*  HGB 8.0* 10.0* 9.0* 9.5* 10.2*  HCT 26.3* 32.0* 31.0* 31.0* 33.7*  MCV 89.5 87.9 92.3 89.9 89.2  PLT 143* 219 181 289 285   BMP &GFR Recent Labs  Lab 10/08/19 0449 10/08/19 1917 10/09/19 0342 10/10/19 0321 10/11/19 0320  NA 136 131* 133* 135 134*  K 2.9* 5.1 4.9 4.6 5.1  CL 113* 101 106 105 104  CO2 18* 23 19* 21* 21*  GLUCOSE 86 148* 280* 219* 230*  BUN 17 15 14 20 21   CREATININE 0.76 0.96 0.80 0.87 0.88  CALCIUM 6.2* 8.0* 7.7* 8.2* 8.5*  MG 1.4*  --  2.7* 2.3 2.1  PHOS 2.1*  --  2.8 2.2* 2.1*   Estimated Creatinine Clearance: 50.6 mL/min (by C-G formula based on SCr of 0.88 mg/dL). Liver & Pancreas: Recent Labs  Lab 10/07/19 1941 10/08/19 1917 10/09/19 0342 10/10/19 0321 10/11/19 0320  AST 64* 44* 23 21 21   ALT 28 22 19 20 20   ALKPHOS 70 63 65 68 69  BILITOT 1.4* 1.5* 0.4 0.4 0.5  PROT 7.7 6.3* 6.0*  6.2* 6.3*  ALBUMIN 3.7 3.0* 2.7* 2.7* 2.9*   No results for input(s): LIPASE, AMYLASE in the last 168 hours. No results for input(s): AMMONIA in the last 168 hours. Diabetic: No results for input(s): HGBA1C in the last 72 hours. Recent Labs  Lab 10/10/19 1622 10/10/19 2106 10/10/19 2144 10/11/19 0803 10/11/19 1152  GLUCAP 135* 56* 71 200* 151*   Cardiac Enzymes: No results for input(s): CKTOTAL, CKMB, CKMBINDEX, TROPONINI in the last 168 hours. No results for input(s): PROBNP in the last 8760 hours. Coagulation Profile: No results for input(s): INR, PROTIME in the last 168 hours. Thyroid Function Tests: No results for input(s): TSH, T4TOTAL, FREET4, T3FREE, THYROIDAB in the last 72 hours. Lipid Profile: No results for input(s): CHOL, HDL, LDLCALC, TRIG, CHOLHDL, LDLDIRECT in the last 72 hours. Anemia Panel: Recent Labs    10/08/19 1916  FERRITIN 86   Urine analysis:    Component Value Date/Time   COLORURINE YELLOW 10/08/2019 0437   APPEARANCEUR HAZY (A) 10/08/2019 0437   LABSPEC 1.013 10/08/2019 0437   PHURINE 5.0 10/08/2019 0437   GLUCOSEU NEGATIVE 10/08/2019 0437   HGBUR SMALL (A) 10/08/2019 0437   BILIRUBINUR NEGATIVE 10/08/2019 0437   KETONESUR NEGATIVE 10/08/2019 0437   PROTEINUR 30 (A) 10/08/2019 0437   UROBILINOGEN 1.0 02/03/2013 0912   NITRITE POSITIVE (A) 10/08/2019 0437   LEUKOCYTESUR LARGE (A) 10/08/2019 0437   Sepsis Labs: Invalid input(s): PROCALCITONIN, [place]  Microbiology: Recent Results (from the past 240 hour(s))  SARS CORONAVIRUS 2 (TAT 6-24 HRS) Nasopharyngeal Nasopharyngeal Swab     Status: Abnormal   Collection Time: 10/07/19 11:36 PM   Specimen: Nasopharyngeal Swab  Result Value Ref Range Status   SARS Coronavirus 2 POSITIVE (A) NEGATIVE Final    Comment: RESULT CALLED TO, READ BACK BY AND VERIFIED WITH: Z TEETERS,RN 1537 10/08/2019 D BRADLEY (NOTE) SARS-CoV-2 target nucleic acids are DETECTED. The SARS-CoV-2 RNA is generally  detectable in upper and lower respiratory specimens during the acute phase of infection. Positive results are indicative of the presence of SARS-CoV-2 RNA. Clinical correlation with patient history and other diagnostic information is  necessary to determine patient infection status. Positive results do not rule out bacterial infection or co-infection with other viruses.  The expected result is Negative. Fact Sheet for Patients: SugarRoll.be Fact Sheet for Healthcare Providers: https://www.woods-mathews.com/ This test is not yet approved or cleared by the [place] FDA and  has been authorized for detection and/or diagnosis of SARS-CoV-2 by FDA under an Emergency Use Authorization (EUA). This EUA will remain  in effect (meaning this test can be used) for  the duration of the COVID-19 declaration under Section 564(b)(1) of the Act, 21 U.S.C. section 360bbb-3(b)(1), unless the authorization is terminated or revoked sooner. Performed at [facility], [place] [address] [place], [place] [address]     Radiology Studies: No results found.    Jaice Digioia T. Gifford  If 7PM-7AM, please contact night-coverage [email or web address] Password [email or web address] 10/11/2019, 1:49 PM

## 2019-10-11 NOTE — Care Management Important Message (Signed)
Important Message  Patient Details IM Letter given to Gabriel Earing RN Case Manager to present to the Patient Name: [name] MRN: [id number] Date of Birth: June 23, 1946   Medicare Important Message Given:  Yes     Kerin Salen 10/11/2019, 10:18 AM

## 2019-10-12 DIAGNOSIS — W19XXXA Unspecified fall, initial encounter: Secondary | ICD-10-CM

## 2019-10-12 DIAGNOSIS — N179 Acute kidney failure, unspecified: Secondary | ICD-10-CM

## 2019-10-12 DIAGNOSIS — E875 Hyperkalemia: Secondary | ICD-10-CM

## 2019-10-12 LAB — CBC WITH DIFFERENTIAL/PLATELET
Abs Immature Granulocytes: 0.11 10*3/uL — ABNORMAL HIGH (ref 0.00–0.07)
Basophils Absolute: 0 10*3/uL (ref 0.0–0.1)
Basophils Relative: 0 %
Eosinophils Absolute: 0 10*3/uL (ref 0.0–0.5)
Eosinophils Relative: 0 %
HCT: 33.6 % — ABNORMAL LOW (ref 36.0–46.0)
Hemoglobin: 10.2 g/dL — ABNORMAL LOW (ref 12.0–15.0)
Immature Granulocytes: 1 %
Lymphocytes Relative: 7 %
Lymphs Abs: 0.7 10*3/uL (ref 0.7–4.0)
MCH: 27 pg (ref 26.0–34.0)
MCHC: 30.4 g/dL (ref 30.0–36.0)
MCV: 88.9 fL (ref 80.0–100.0)
Monocytes Absolute: 0.6 10*3/uL (ref 0.1–1.0)
Monocytes Relative: 6 %
Neutro Abs: 8.6 10*3/uL — ABNORMAL HIGH (ref 1.7–7.7)
Neutrophils Relative %: 86 %
Platelets: 327 10*3/uL (ref 150–400)
RBC: 3.78 MIL/uL — ABNORMAL LOW (ref 3.87–5.11)
RDW: 18.2 % — ABNORMAL HIGH (ref 11.5–15.5)
WBC: 10 10*3/uL (ref 4.0–10.5)
nRBC: 0 % (ref 0.0–0.2)

## 2019-10-12 LAB — C-REACTIVE PROTEIN: CRP: 7 mg/dL — ABNORMAL HIGH (ref <1.0)

## 2019-10-12 LAB — COMPREHENSIVE METABOLIC PANEL
ALT: 20 U/L (ref 0–44)
AST: 18 U/L (ref 15–41)
Albumin: 2.9 g/dL — ABNORMAL LOW (ref 3.5–5.0)
Alkaline Phosphatase: 67 U/L (ref 38–126)
Anion gap: 10 (ref 5–15)
BUN: 24 mg/dL — ABNORMAL HIGH (ref 8–23)
CO2: 25 mmol/L (ref 22–32)
Calcium: 9 mg/dL (ref 8.9–10.3)
Chloride: 99 mmol/L (ref 98–111)
Creatinine, Ser: 1.04 mg/dL — ABNORMAL HIGH (ref 0.44–1.00)
GFR calc Af Amer: >60 mL/min (ref >60)
GFR calc non Af Amer: 54 mL/min — ABNORMAL LOW (ref >60)
Glucose, Bld: 231 mg/dL — ABNORMAL HIGH (ref 70–99)
Potassium: 5.3 mmol/L — ABNORMAL HIGH (ref 3.5–5.1)
Sodium: 134 mmol/L — ABNORMAL LOW (ref 135–145)
Total Bilirubin: 0.4 mg/dL (ref 0.3–1.2)
Total Protein: 6.3 g/dL — ABNORMAL LOW (ref 6.5–8.1)

## 2019-10-12 LAB — GLUCOSE, CAPILLARY
Glucose-Capillary: 210 mg/dL — ABNORMAL HIGH (ref 70–99)
Glucose-Capillary: 225 mg/dL — ABNORMAL HIGH (ref 70–99)
Glucose-Capillary: 122 mg/dL — ABNORMAL HIGH (ref 70–99)
Glucose-Capillary: 99 mg/dL (ref 70–99)

## 2019-10-12 LAB — D-DIMER, QUANTITATIVE: D-Dimer, Quant: 2.11 ug/mL-FEU — ABNORMAL HIGH (ref 0.00–0.50)

## 2019-10-12 LAB — PHOSPHORUS: Phosphorus: 3.5 mg/dL (ref 2.5–4.6)

## 2019-10-12 LAB — MAGNESIUM: Magnesium: 2 mg/dL (ref 1.7–2.4)

## 2019-10-12 MED ORDER — INSULIN ASPART 100 UNIT/ML ~~LOC~~ SOLN
0.0000 [IU] | Freq: Every day | SUBCUTANEOUS | Status: DC
  Administered 2019-10-13: 2 [IU] via SUBCUTANEOUS

## 2019-10-12 MED ORDER — INSULIN ASPART 100 UNIT/ML ~~LOC~~ SOLN
4.0000 [IU] | Freq: Three times a day (TID) | SUBCUTANEOUS | Status: DC
  Administered 2019-10-12 – 2019-10-15 (×7): 4 [IU] via SUBCUTANEOUS

## 2019-10-12 MED ORDER — INSULIN ASPART 100 UNIT/ML ~~LOC~~ SOLN
0.0000 [IU] | Freq: Three times a day (TID) | SUBCUTANEOUS | Status: DC
  Administered 2019-10-12: 1 [IU] via SUBCUTANEOUS
  Administered 2019-10-13: 5 [IU] via SUBCUTANEOUS
  Administered 2019-10-13: 3 [IU] via SUBCUTANEOUS
  Administered 2019-10-14: 5 [IU] via SUBCUTANEOUS
  Administered 2019-10-14 – 2019-10-15 (×3): 3 [IU] via SUBCUTANEOUS

## 2019-10-12 MED ORDER — ASPIRIN 81 MG PO CHEW
CHEWABLE_TABLET | ORAL | Status: AC
  Filled 2019-10-12: qty 1

## 2019-10-12 NOTE — Progress Notes (Signed)
PROGRESS NOTE  [name] TDS:[id number] DOB: [birth year]   PCP: [name], MD  Patient is from: Home.  Lives with daughter.  Uses walker for ambulation at baseline.  DOA: 10/07/2019 LOS: 4  Brief Narrative / Interim history: 73 year old female with history of COPD not on oxygen, DM-2, HTN, HLD, Takotsubo cardiomyopathy, diastolic CHF, LBBB, former tobacco use, CVA, anxiety and depression presenting with recurrent fall and right hip pain and tested positive for COVID-19.  Reportedly patient has been in decline since the loss of her husband few months ago.  In ED, hemodynamically stable and afebrile.  Saturating at 98% on RA.  K2.9.  CO2 18.  Calcium 6.2.  Magnesium 1.4.  Phosphorus 2.1.  Hgb 8.0 (baseline 9-10).  Platelet 143.  UA with nitrites, LE and bacteria.  CT head negative for acute intracranial process but small scalp hematoma over right parietal area.  Hip x-ray negative for acute finding.  CXR without acute finding.  Admitted for recurrent fall, right hip pain, dehydration and physical deconditioning, and tested positive for COVID-19.  Started on remdesivir and Decadron on 10/08/2019.  No significant respiratory symptoms.  Remained stable on room air.  Inflammatory markers improved.  Evaluated by PT/OT who recommended SNF.    Subjective: No major events overnight of this morning.  No complaints.  She denies chest pain, dyspnea, GI or UTI issues.  Reports intermittent cough.  Objective: Vitals:   10/11/19 0532 10/11/19 1414 10/11/19 2050 10/12/19 0448  BP: 129/60 124/68 122/66 (!) 130/54  Pulse: 63 66 79 67  Resp: 20 20 17 16   Temp: 97.6 F (36.4 C) 98 F (36.7 C) 98.1 F (36.7 C) 98.3 F (36.8 C)  TempSrc: Oral Oral Oral Oral  SpO2: 97% 99% 94% 97%  Weight:      Height:        Intake/Output Summary (Last 24 hours) at 10/12/2019 1425 Last data filed at 10/12/2019 0959 Gross per 24 hour  Intake 480 ml  Output 900 ml  Net -420 ml   Filed Weights   10/07/19 1918  Weight: 63 kg    Examination:  GENERAL: No acute distress.  Appears well.  HEENT: MMM.  Vision and hearing grossly intact.  NECK: Supple.  No apparent JVD.  RESP:  No IWOB.  Fair aeration bilaterally. CVS:  RRR. Heart sounds normal.  ABD/GI/GU: Bowel sounds present. Soft. Non tender.  MSK/EXT:  Moves extremities. No apparent deformity or edema.  SKIN: no apparent skin lesion or wound NEURO: Awake, alert and oriented appropriately.  No apparent focal neuro deficit. PSYCH: Calm. Normal affect.   Procedures:  None  Assessment & Plan: Recurrent/physical deconditioning/right hip pain: reportedly she has been in decline since she lost her husband.  No prodrome leading to fall.  Dehydration and COVID-19 infection could have played a role.  CT head and hip x-ray without acute finding other than small scalp hematoma.  Currently pain-free.  No focal tenderness or pain with lower extremity movement.  -Fall precautions and PT/OT-recommended SNF-daughter and patient in agreement. -TOC working on placement.  COVID-19 infection: Incidentally tested positive.  Has no respiratory or GI symptoms yet.  Inflammatory markers improving.  CXR negative.  On room air. Recent Labs    10/10/19 0321 10/11/19 0320 10/11/19 1431 10/12/19 0258  DDIMER 2.33* 2.33*  --  2.11*  FERRITIN  --   --  86  --   CRP 11.5* 7.4*  --  7.0*  -Continue Decadron and remdesivir 12/15>> -Continue inhalers,  incentive spirometry -OOB/PT/OT  Uncontrolled DM-2 with hyperglycemia: A1c 5.1% on 08/29/2019.  Hyperglycemia likely due to steroid. Recent Labs    10/11/19 2047 10/12/19 0736 10/12/19 1125  GLUCAP 101* 210* 225*  -Continue SSI-moderate, Tradjenta and Lipitor. -Add NovoLog 4 units AC.  AKI with mild azotemia: Cr 0.76 (admit)>> 1.04.  Not on nephrotoxic meds. -Encourage p.o. intake.  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  Stable -Continue  monitoring.  Metabolic acidosis: Likely due to IV fluid.  Resolved.  Hypokalemia/hypomagnesemia/hypophosphatemia: Resolved  Hyperkalemia: Mild. -Recheck in the morning  Anemia of chronic disease: Anemia panel consistent.  Stable. -Hgb 9-10 (baseline)> 10.9 (admit)> 9.0> 9.5> 10.2 -Continue ferrous sulfate  Pyuria/bacteriuria: Patient without UTI symptoms. -No treatment recommended. Essential hypertension: Normotensive. -Continue home metoprolol.  Chronic diastolic CHF/history of Takotsubo cardiomyopathy: Appears euvolemic.  No cardiopulmonary symptoms.  Not on diuretics at home. -Monitor fluid status  History of lung cancer s/p chemoradiation in remission -Outpatient follow-up  History of CVA: Stable.  No focal neuro deficit. -Continue Lipitor.  Chronic COPD: Stable.  No respiratory symptoms despite Covid infection. -Continue inhalers and supportive care -Treat Covid as above.  Depression/anxiety: Stable -Continue Wellbutrin and Lexapro                   DVT prophylaxis: Subcu Lovenox Code Status: Full code Family Communication: Updated patient's daughter over the phone on 12/18.Marland Kitchen Disposition Plan: Remains inpatient.  Final disposition SNF Consultants: None   Microbiology summarized: [id number] positive.  Sch Meds:  Scheduled Meds: . aspirin EC  81 mg Oral Daily  . atorvastatin  40 mg Oral Daily  . buPROPion  150 mg Oral Daily  . dexamethasone  6 mg Oral Q24H  . enoxaparin (LOVENOX) injection  40 mg Subcutaneous QHS  . escitalopram  20 mg Oral Daily  . influenza vaccine adjuvanted  0.5 mL Intramuscular Tomorrow-1000  . insulin aspart  0-15 Units Subcutaneous TID WC  . insulin aspart  0-5 Units Subcutaneous QHS  . insulin aspart  4 Units Subcutaneous TID WC  . linagliptin  5 mg Oral Daily  . loratadine  10 mg Oral Daily  . metoprolol tartrate  25 mg Oral Daily  . metoprolol tartrate  50 mg Oral QHS  . pantoprazole  40 mg Oral Daily  . pneumococcal  23 valent vaccine  0.5 mL Intramuscular Tomorrow-1000  . sodium chloride flush  3 mL Intravenous Once   Continuous Infusions:  PRN Meds:.acetaminophen **OR** acetaminophen, albuterol, guaiFENesin-dextromethorphan, ondansetron **OR** ondansetron (ZOFRAN) IV, polyethylene glycol  Antimicrobials: Anti-infectives (From admission, onward)   Start     Dose/Rate Route Frequency Ordered Stop   10/09/19 1000  remdesivir 100 mg in sodium chloride 0.9 % 100 mL IVPB     100 mg 200 mL/hr over 30 Minutes Intravenous Daily 10/08/19 1804 10/12/19 1045   10/08/19 1930  remdesivir 200 mg in sodium chloride 0.9% 250 mL IVPB     200 mg 580 mL/hr over 30 Minutes Intravenous Once 10/08/19 1804 10/08/19 2102       I have personally reviewed the following labs and images: CBC: Recent Labs  Lab 10/08/19 1917 10/09/19 0342 10/10/19 0321 10/11/19 0320 10/12/19 0258  WBC 11.1* 7.2 11.1* 11.4* 10.0  NEUTROABS 8.8* 6.4 10.2* 10.1* 8.6*  HGB 10.0* 9.0* 9.5* 10.2* 10.2*  HCT 32.0* 31.0* 31.0* 33.7* 33.6*  MCV 87.9 92.3 89.9 89.2 88.9  PLT 219 181 289 285 327   BMP &GFR Recent Labs  Lab 10/08/19 0449 10/08/19 1917  10/09/19 0342 10/10/19 0321 10/11/19 0320 10/12/19 0258  NA 136 131* 133* 135 134* 134*  K 2.9* 5.1 4.9 4.6 5.1 5.3*  CL 113* 101 106 105 104 99  CO2 18* 23 19* 21* 21* 25  GLUCOSE 86 148* 280* 219* 230* 231*  BUN 17 15 14 20 21  24*  CREATININE 0.76 0.96 0.80 0.87 0.88 1.04*  CALCIUM 6.2* 8.0* 7.7* 8.2* 8.5* 9.0  MG 1.4*  --  2.7* 2.3 2.1 2.0  PHOS 2.1*  --  2.8 2.2* 2.1* 3.5   Estimated Creatinine Clearance: 42.8 mL/min (A) (by C-G formula based on SCr of 1.04 mg/dL (H)). Liver & Pancreas: Recent Labs  Lab 10/08/19 1917 10/09/19 0342 10/10/19 0321 10/11/19 0320 10/12/19 0258  AST 44* 23 21 21 18   ALT 22 19 20 20 20   ALKPHOS 63 65 68 69 67  BILITOT 1.5* 0.4 0.4 0.5 0.4  PROT 6.3* 6.0* 6.2* 6.3* 6.3*  ALBUMIN 3.0* 2.7* 2.7* 2.9* 2.9*   No results for input(s): LIPASE,  AMYLASE in the last 168 hours. No results for input(s): AMMONIA in the last 168 hours. Diabetic: No results for input(s): HGBA1C in the last 72 hours. Recent Labs  Lab 10/11/19 1607 10/11/19 1629 10/11/19 2047 10/12/19 0736 10/12/19 1125  GLUCAP 68* 80 101* 210* 225*   Cardiac Enzymes: No results for input(s): CKTOTAL, CKMB, CKMBINDEX, TROPONINI in the last 168 hours. No results for input(s): PROBNP in the last 8760 hours. Coagulation Profile: No results for input(s): INR, PROTIME in the last 168 hours. Thyroid Function Tests: No results for input(s): TSH, T4TOTAL, FREET4, T3FREE, THYROIDAB in the last 72 hours. Lipid Profile: No results for input(s): CHOL, HDL, LDLCALC, TRIG, CHOLHDL, LDLDIRECT in the last 72 hours. Anemia Panel: Recent Labs    10/11/19 1431  VITAMINB12 1,454*  FOLATE 14.2  FERRITIN 86  TIBC 236*  IRON 44  RETICCTPCT 0.6   Urine analysis:    Component Value Date/Time   COLORURINE YELLOW 10/08/2019 0437   APPEARANCEUR HAZY (A) 10/08/2019 0437   LABSPEC 1.013 10/08/2019 0437   PHURINE 5.0 10/08/2019 0437   GLUCOSEU NEGATIVE 10/08/2019 0437   HGBUR SMALL (A) 10/08/2019 [place] 10/08/2019 [place] 10/08/2019 0437   PROTEINUR 30 (A) 10/08/2019 0437   UROBILINOGEN 1.0 02/03/2013 0912   NITRITE POSITIVE (A) 10/08/2019 0437   LEUKOCYTESUR LARGE (A) 10/08/2019 0437   Sepsis Labs: Invalid input(s): PROCALCITONIN, Edith [place]  Microbiology: Recent Results (from the past 240 hour(s))  SARS CORONAVIRUS 2 (TAT 6-24 HRS) Nasopharyngeal Nasopharyngeal Swab     Status: Abnormal   Collection Time: 10/07/19 11:36 PM   Specimen: Nasopharyngeal Swab  Result Value Ref Range Status   SARS Coronavirus 2 POSITIVE (A) NEGATIVE Final    Comment: RESULT CALLED TO, READ BACK BY AND VERIFIED WITH: Z TEETERS,RN 1537 10/08/2019 D BRADLEY (NOTE) SARS-CoV-2 target nucleic acids are DETECTED. The SARS-CoV-2 RNA is generally detectable  in upper and lower respiratory specimens during the acute phase of infection. Positive results are indicative of the presence of SARS-CoV-2 RNA. Clinical correlation with patient history and other diagnostic information is  necessary to determine patient infection status. Positive results do not rule out bacterial infection or co-infection with other viruses.  The expected result is Negative. Fact Sheet for Patients: SugarRoll.be Fact Sheet for Healthcare Providers: https://www.woods-mathews.com/ This test is not yet approved or cleared by the [place] FDA and  has been authorized for detection and/or diagnosis of SARS-CoV-2 by  FDA under an Emergency Use Authorization (EUA). This EUA will remain  in effect (meaning this test can be used) for  the duration of the COVID-19 declaration under Section 564(b)(1) of the Act, 21 U.S.C. section 360bbb-3(b)(1), unless the authorization is terminated or revoked sooner. Performed at [facility], [place] [address], [place], [place] [address]     Radiology Studies: No results found.    Justinn Welter T. [place]  If 7PM-7AM, please contact night-coverage [email or web address] Password [email or web address] 10/12/2019, 2:25 PM

## 2019-10-13 LAB — CBC WITH DIFFERENTIAL/PLATELET
Abs Immature Granulocytes: 0.14 10*3/uL — ABNORMAL HIGH (ref 0.00–0.07)
Basophils Absolute: 0 10*3/uL (ref 0.0–0.1)
Basophils Relative: 0 %
Eosinophils Absolute: 0 10*3/uL (ref 0.0–0.5)
Eosinophils Relative: 0 %
HCT: 33.8 % — ABNORMAL LOW (ref 36.0–46.0)
Hemoglobin: 10.3 g/dL — ABNORMAL LOW (ref 12.0–15.0)
Immature Granulocytes: 1 %
Lymphocytes Relative: 6 %
Lymphs Abs: 0.7 10*3/uL (ref 0.7–4.0)
MCH: 26.7 pg (ref 26.0–34.0)
MCHC: 30.5 g/dL (ref 30.0–36.0)
MCV: 87.6 fL (ref 80.0–100.0)
Monocytes Absolute: 0.5 10*3/uL (ref 0.1–1.0)
Monocytes Relative: 5 %
Neutro Abs: 9.4 10*3/uL — ABNORMAL HIGH (ref 1.7–7.7)
Neutrophils Relative %: 88 %
Platelets: 313 10*3/uL (ref 150–400)
RBC: 3.86 MIL/uL — ABNORMAL LOW (ref 3.87–5.11)
RDW: 18.2 % — ABNORMAL HIGH (ref 11.5–15.5)
WBC: 10.8 10*3/uL — ABNORMAL HIGH (ref 4.0–10.5)
nRBC: 0 % (ref 0.0–0.2)

## 2019-10-13 LAB — COMPREHENSIVE METABOLIC PANEL
ALT: 21 U/L (ref 0–44)
AST: 16 U/L (ref 15–41)
Albumin: 3.1 g/dL — ABNORMAL LOW (ref 3.5–5.0)
Alkaline Phosphatase: 69 U/L (ref 38–126)
Anion gap: 11 (ref 5–15)
BUN: 27 mg/dL — ABNORMAL HIGH (ref 8–23)
CO2: 24 mmol/L (ref 22–32)
Calcium: 9.2 mg/dL (ref 8.9–10.3)
Chloride: 99 mmol/L (ref 98–111)
Creatinine, Ser: 0.92 mg/dL (ref 0.44–1.00)
GFR calc Af Amer: >60 mL/min (ref >60)
GFR calc non Af Amer: >60 mL/min (ref >60)
Glucose, Bld: 269 mg/dL — ABNORMAL HIGH (ref 70–99)
Potassium: 5 mmol/L (ref 3.5–5.1)
Sodium: 134 mmol/L — ABNORMAL LOW (ref 135–145)
Total Bilirubin: 0.8 mg/dL (ref 0.3–1.2)
Total Protein: 6.1 g/dL — ABNORMAL LOW (ref 6.5–8.1)

## 2019-10-13 LAB — GLUCOSE, CAPILLARY
Glucose-Capillary: 230 mg/dL — ABNORMAL HIGH (ref 70–99)
Glucose-Capillary: 192 mg/dL — ABNORMAL HIGH (ref 70–99)
Glucose-Capillary: 78 mg/dL (ref 70–99)
Glucose-Capillary: 210 mg/dL — ABNORMAL HIGH (ref 70–99)

## 2019-10-13 LAB — MAGNESIUM: Magnesium: 2 mg/dL (ref 1.7–2.4)

## 2019-10-13 LAB — PHOSPHORUS: Phosphorus: 5.4 mg/dL — ABNORMAL HIGH (ref 2.5–4.6)

## 2019-10-13 LAB — C-REACTIVE PROTEIN: CRP: 6.9 mg/dL — ABNORMAL HIGH (ref <1.0)

## 2019-10-13 LAB — D-DIMER, QUANTITATIVE: D-Dimer, Quant: 1.98 ug/mL-FEU — ABNORMAL HIGH (ref 0.00–0.50)

## 2019-10-13 NOTE — Progress Notes (Signed)
PROGRESS NOTE  [name] XIP:[id number] DOB: September 15, 1946 DOA: 10/07/2019 PCP: [name], MD   LOS: 5 days   Brief Narrative / Interim history: 73 year old female with history of COPD not on oxygen, DM-2, HTN, HLD, Takotsubo cardiomyopathy, diastolic CHF, LBBB, former tobacco use, CVA, anxiety and depression presenting with recurrent fall and right hip pain and tested positive for COVID-19.  Reportedly patient has been in decline since the loss of her husband few months ago.  Subjective / 24h Interval events: No complaints, denies any shortness of breath, denies any chest pain  Assessment & Plan:  Principal Problem Covid-19 Viral Illness -finished remdesivir, continue steroids for total of 10 days -Supportive treatment -Very deconditioned and PT recommends SNF, placement pending   COVID-19 Labs  Recent Labs    10/11/19 0320 10/11/19 1431 10/12/19 0258 10/13/19 0258  DDIMER 2.33*  --  2.11* 1.98*  FERRITIN  --  86  --   --   CRP 7.4*  --  7.0* 6.9*    Lab Results  Component Value Date   SARSCOV2NAA POSITIVE (A) 10/07/2019   Active Problems Type 2 diabetes mellitus, with hyperglycemia -Continue sliding scale, Tradjenta, Lipitor CBG (last 3)  Recent Labs    10/12/19 2111 10/13/19 0747 10/13/19 1127  GLUCAP 99 230* 192*   Acute kidney injury -Resolved with fluids  Hyponatremia -Stable, monitor, multifactorial including dehydration/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  Depression/anxiety -Continue Wellbutrin and Lexapro   Scheduled Meds: . aspirin EC  81 mg Oral Daily  . atorvastatin  40 mg Oral Daily  . buPROPion  150 mg Oral Daily  . dexamethasone  6 mg Oral Q24H  . enoxaparin (LOVENOX) injection  40 mg Subcutaneous QHS  . escitalopram  20 mg Oral Daily  . insulin aspart  0-15 Units Subcutaneous TID WC  . insulin aspart  0-5 Units Subcutaneous QHS  . insulin aspart  4 Units Subcutaneous TID WC  . linagliptin  5 mg Oral Daily  . loratadine  10 mg Oral Daily  . metoprolol tartrate  25 mg Oral Daily  . metoprolol tartrate  50 mg Oral QHS  . pantoprazole  40 mg Oral Daily  . sodium chloride flush  3 mL Intravenous Once   Continuous Infusions: PRN Meds:.acetaminophen **OR** acetaminophen, albuterol, guaiFENesin-dextromethorphan, ondansetron **OR** ondansetron (ZOFRAN) IV, polyethylene glycol  DVT prophylaxis: Lovenox Code Status: Full code Family Communication: no family at bedside  Disposition Plan: SNF when bed available  Consultants:  none  Procedures:  none  Microbiology: none  Antimicrobials: none   Objective: Vitals:   10/13/19 0450 10/13/19 0500 10/13/19 1047 10/13/19 1426  BP: 129/61  115/64 (!) 109/57  Pulse: 64  85 75  Resp:  20 16 15   Temp: (!) 97.5 F (36.4 C)  (!) 97.5 F (36.4 C) 97.8 F (36.6 C)  TempSrc: Oral  Oral Oral  SpO2: 91% 96% 99% 93%  Weight:      Height:        Intake/Output Summary (Last 24 hours) at 10/13/2019 1431 Last data filed at 10/13/2019 0957 Gross per 24 hour  Intake 1080 ml  Output 1850 ml  Net -770 ml   Filed Weights   10/07/19 1918  Weight: 63 kg    Examination:  Constitutional: NAD Eyes: no scleral icterus ENMT: Mucous membranes are moist.  Neck: normal, supple Respiratory: clear to auscultation bilaterally, no wheezing, no crackles.  Cardiovascular: Regular rate and rhythm, no murmurs / rubs / gallops. Abdomen: non distended, no tenderness. Bowel sounds  positive.  Musculoskeletal: no clubbing / cyanosis.  Skin: no rashes Neurologic: CN 2-12 grossly intact. Strength 5/5 in all 4.   Data Reviewed: I have independently reviewed following labs and imaging studies   CBC: Recent Labs  Lab 10/09/19 0342 10/10/19 0321 10/11/19 0320 10/12/19 0258 10/13/19 0258  WBC 7.2 11.1* 11.4* 10.0 10.8*  NEUTROABS 6.4 10.2* 10.1* 8.6* 9.4*  HGB 9.0* 9.5* 10.2* 10.2* 10.3*  HCT 31.0* 31.0* 33.7* 33.6* 33.8*  MCV 92.3 89.9 89.2 88.9 87.6  PLT 181 289 285 327 902   Basic Metabolic Panel: Recent Labs  Lab 10/09/19 0342 10/10/19 0321 10/11/19 0320 10/12/19 0258 10/13/19 0258  NA 133* 135 134* 134* 134*  K 4.9 4.6 5.1 5.3* 5.0  CL 106 105 104 99 99  CO2 19* 21* 21* 25 24  GLUCOSE 280* 219* 230* 231* 269*  BUN 14 20 21  24* 27*  CREATININE 0.80 0.87 0.88 1.04* 0.92  CALCIUM 7.7* 8.2* 8.5* 9.0 9.2  MG 2.7* 2.3 2.1 2.0 2.0  PHOS 2.8 2.2* 2.1* 3.5 5.4*   GFR: Estimated Creatinine Clearance: 48.4 mL/min (by C-G formula based on SCr of 0.92 mg/dL). Liver Function Tests: Recent Labs  Lab 10/09/19 0342 10/10/19 0321 10/11/19 0320 10/12/19 0258 10/13/19 0258  AST 23 21 21 18 16   ALT 19 20 20 20 21   ALKPHOS 65 68 69 67 69  BILITOT 0.4 0.4 0.5 0.4 0.8  PROT 6.0* 6.2* 6.3* 6.3* 6.1*  ALBUMIN 2.7* 2.7* 2.9* 2.9* 3.1*   No results for input(s): LIPASE, AMYLASE in the last 168 hours. No results for input(s): AMMONIA in the last 168 hours. Coagulation Profile: No results for input(s): INR, PROTIME in the last 168 hours. Cardiac Enzymes: No results for input(s): CKTOTAL, CKMB, CKMBINDEX, TROPONINI in the last 168 hours. BNP (last 3 results) No results for input(s): PROBNP in the last 8760 hours. HbA1C: No results for input(s): HGBA1C in the last 72 hours. CBG: Recent Labs  Lab 10/12/19 1125 10/12/19 1604 10/12/19 2111 10/13/19 0747 10/13/19 1127  GLUCAP 225* 122* 99 230* 192*   Lipid Profile: No results for input(s): CHOL, HDL,  LDLCALC, TRIG, CHOLHDL, LDLDIRECT in the last 72 hours. Thyroid Function Tests: No results for input(s): TSH, T4TOTAL, FREET4, T3FREE, THYROIDAB in the last 72 hours. Anemia Panel: Recent Labs    10/11/19 1431  VITAMINB12 1,454*  FOLATE 14.2  FERRITIN 86  TIBC 236*  IRON 44  RETICCTPCT 0.6   Urine analysis:    Component Value Date/Time   COLORURINE YELLOW 10/08/2019 0437   APPEARANCEUR HAZY (A) 10/08/2019 0437   LABSPEC 1.013 10/08/2019 0437   PHURINE 5.0 10/08/2019 0437   GLUCOSEU NEGATIVE 10/08/2019 0437   HGBUR SMALL (A) 10/08/2019 [place] 10/08/2019 [place] 10/08/2019 0437   PROTEINUR 30 (A) 10/08/2019 0437   UROBILINOGEN 1.0 02/03/2013 0912   NITRITE POSITIVE (A) 10/08/2019 0437   LEUKOCYTESUR LARGE (A) 10/08/2019 0437   Sepsis Labs: Invalid input(s): PROCALCITONIN, LACTICIDVEN  Recent Results (from the past 240 hour(s))  SARS CORONAVIRUS 2 (TAT 6-24 HRS) Nasopharyngeal Nasopharyngeal  Swab     Status: Abnormal   Collection Time: 10/07/19 11:36 PM   Specimen: Nasopharyngeal Swab  Result Value Ref Range Status   SARS Coronavirus 2 POSITIVE (A) NEGATIVE Final    Comment: RESULT CALLED TO, READ BACK BY AND VERIFIED WITH: Z TEETERS,RN 1537 10/08/2019 D BRADLEY (NOTE) SARS-CoV-2 target nucleic acids are DETECTED. The SARS-CoV-2 RNA is generally detectable in upper and lower respiratory specimens during the acute phase of infection. Positive results are indicative of the presence of SARS-CoV-2 RNA. Clinical correlation with patient history and other diagnostic information is  necessary to determine patient infection status. Positive results do not rule out bacterial infection or co-infection with other viruses.  The expected result is Negative. Fact Sheet for Patients: SugarRoll.be Fact Sheet for Healthcare Providers: https://www.woods-mathews.com/ This test is not yet approved or cleared  by the [place] FDA and  has been authorized for detection and/or diagnosis of SARS-CoV-2 by FDA under an Emergency Use Authorization (EUA). This EUA will remain  in effect (meaning this test can be used) for  the duration of the COVID-19 declaration under Section 564(b)(1) of the Act, 21 U.S.C. section 360bbb-3(b)(1), unless the authorization is terminated or revoked sooner. Performed at [facility], [place] [address] [place], [place] [address]       Radiology Studies: No results found.   Marzetta Board, MD, PhD [facility]  Contact via  [email or web address]  [place] P: [phone] F: [phone]

## 2019-10-14 LAB — GLUCOSE, CAPILLARY
Glucose-Capillary: 206 mg/dL — ABNORMAL HIGH (ref 70–99)
Glucose-Capillary: 186 mg/dL — ABNORMAL HIGH (ref 70–99)
Glucose-Capillary: 89 mg/dL (ref 70–99)
Glucose-Capillary: 145 mg/dL — ABNORMAL HIGH (ref 70–99)

## 2019-10-14 NOTE — Progress Notes (Signed)
Physical Therapy Treatment Patient Details Name: [name] MRN: [id number] DOB: 1946/04/26 Today's Date: 10/14/2019    History of Present Illness Lakely Elmendorf is a 73 year old female with history of COPD not on oxygen, DM-2, HTN, HLD, Takotsubo cardiomyopathy, diastolic CHF, LBBB, former tobacco use, CVA, anxiety and depression presenting with recurrent fall and right hip pain and tested positive for COVID-19.  Reportedly patient has been in decline since the loss of her husband few months ago.    PT Comments    Very sweet lady and motivated to "get better".  Assisted OOB to amb in room.  Limited distance in room due to COVID precautions.  General bed mobility comments: increased time, hand hold, use of bed rail, HOB elevated but self able. General Gait Details: poor forward flex posture and posterior LOB 3 times during session.  Would NOT advise pt amb on her own. Pt will need ST Rehab at SNF prior to returning to home.   Follow Up Recommendations  SNF     Equipment Recommendations  None recommended by PT    Recommendations for Other Services       Precautions / Restrictions Precautions Precautions: Fall Precaution Comments: posterior LOB Restrictions Weight Bearing Restrictions: No Other Position/Activity Restrictions: COVID precautions    Mobility  Bed Mobility Overal bed mobility: Needs Assistance Bed Mobility: Supine to Sit     Supine to sit: Supervision     General bed mobility comments: increased time, hand hold, use of bed rail, HOB elevated but self able  Transfers      MinGuard Assist with 50% VC's on proper hand placement with both sit to stand and stand to sit.  Pt with uncontrolled "plop" in recliner despite VC.                Ambulation/Gait Ambulation/Gait assistance: Min assist;Min guard Gait Distance (Feet): 12 Feet Assistive device: Rolling walker (2 wheeled) Gait Pattern/deviations: Step-to pattern;Decreased step length -  right;Decreased step length - left;Decreased stride length Gait velocity: decreased   General Gait Details: poor forward flex posture and posterior LOB 3 times during session.  Would NOT advise pt amb on her own.   Stairs             Wheelchair Mobility    Modified Rankin (Stroke Patients Only)       Balance                                            Cognition Arousal/Alertness: Awake/alert Behavior During Therapy: WFL for tasks assessed/performed                                   General Comments: very pleasant and funny.  Cute.      Exercises      General Comments        Pertinent Vitals/Pain Pain Assessment: No/denies pain    Home Living                      Prior Function            PT Goals (current goals can now be found in the care plan section) Progress towards PT goals: Progressing toward goals    Frequency    Min 3X/week      PT Plan  Current plan remains appropriate    Co-evaluation              AM-PAC PT "6 Clicks" Mobility   Outcome Measure  Help needed turning from your back to your side while in a flat bed without using bedrails?: A Little Help needed moving from lying on your back to sitting on the side of a flat bed without using bedrails?: A Little Help needed moving to and from a bed to a chair (including a wheelchair)?: A Little Help needed standing up from a chair using your arms (e.g., wheelchair or bedside chair)?: A Little Help needed to walk in hospital room?: A Lot Help needed climbing 3-5 steps with a railing? : A Lot 6 Click Score: 16    End of Session Equipment Utilized During Treatment: Gait belt Activity Tolerance: Patient limited by fatigue Patient left: in chair;with call bell/phone within reach;with chair alarm set Nurse Communication: Mobility status PT Visit Diagnosis: Unsteadiness on feet (R26.81);Muscle weakness (generalized) (M62.81);History of falling  (Z91.81)     Time: 9381-8299 PT Time Calculation (min) (ACUTE ONLY): 29 min  Charges:  $Gait Training: 8-22 mins $Therapeutic Activity: 8-22 mins                     Rica Koyanagi  PTA Acute  Rehabilitation Services Pager      [phone] Office      [phone]

## 2019-10-14 NOTE — TOC Progression Note (Signed)
Transition of Care [facility]) - Progression Note    Patient Details  Name: [name] MRN: [id number] Date of Birth: August 07, 1946  Transition of Care [facility]) CM/SW [place], [name] Phone Number: 10/14/2019, 3:34 PM  Clinical Narrative:  CSW following patient for support and dishrag eneeds. Patient is unable to tranfers to [facility] due to no bed availability. [place] made a bed offer. CSW spoke to patients daughter via phone and explained that [facility] is full and will not be able to take patient. Daughter stated she will be agreeable to send patient to [facility]. MD made aware and patient will be able to discharge tomorrow morning            Expected Discharge Plan and Services                                                 Social Determinants of Health (SDOH) Interventions    Readmission Risk Interventions No flowsheet data found.

## 2019-10-14 NOTE — Progress Notes (Signed)
PROGRESS NOTE  [name] WUJ:[id number] DOB: [birth year] DOA: 10/07/2019 PCP: [name], MD   LOS: 6 days   Brief Narrative / Interim history: 73 year old female with history of COPD not on oxygen, DM-2, HTN, HLD, Takotsubo cardiomyopathy, diastolic CHF, LBBB, former tobacco use, CVA, anxiety and depression presenting with recurrent fall and right hip pain and tested positive for COVID-19.  Reportedly patient has been in decline since the loss of her husband few months ago.  Subjective / 24h Interval events: No complaints. Pleasant. No dyspnea, no chest pain. Awaiting SNF  Assessment & Plan:  Principal Problem Covid-19 Viral Illness -finished remdesivir, continue steroids for total of 10 days -Supportive treatment -Very deconditioned and PT recommends SNF, placement pending   COVID-19 Labs  Recent Labs    10/11/19 1431 10/12/19 0258 10/13/19 0258  DDIMER  --  2.11* 1.98*  FERRITIN 86  --   --   CRP  --  7.0* 6.9*    Lab Results  Component Value Date   SARSCOV2NAA POSITIVE (A) 10/07/2019   Active Problems Type 2 diabetes mellitus, with hyperglycemia -Continue sliding scale, Tradjenta, Lipitor -CBGs as below  CBG (last 3)  Recent Labs    10/13/19 2039 10/14/19 0813 10/14/19 1225  GLUCAP 210* 206* 186*   Acute kidney injury -Resolved with fluids, Cr stable. Will check in am   Hyponatremia -Stable, monitor, multifactorial including dehydration/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  Depression/anxiety -Continue Wellbutrin and Lexapro   Scheduled Meds: . aspirin EC  81 mg Oral Daily  . atorvastatin  40 mg Oral Daily  . buPROPion  150 mg Oral Daily  . dexamethasone  6 mg Oral Q24H  . enoxaparin (LOVENOX) injection  40 mg Subcutaneous QHS  . escitalopram  20 mg Oral Daily  . insulin aspart  0-15 Units Subcutaneous TID WC  . insulin aspart  0-5 Units Subcutaneous QHS  . insulin aspart  4 Units Subcutaneous TID WC  . linagliptin  5 mg Oral Daily  . loratadine  10 mg Oral Daily  . metoprolol tartrate  25 mg Oral Daily  . metoprolol tartrate  50 mg Oral QHS  . pantoprazole  40 mg Oral Daily  . sodium chloride flush  3 mL Intravenous Once   Continuous Infusions: PRN Meds:.acetaminophen **OR** acetaminophen, albuterol, guaiFENesin-dextromethorphan, ondansetron **OR** ondansetron (ZOFRAN) IV, polyethylene glycol  DVT prophylaxis: Lovenox Code Status: Full code Family Communication: no family at bedside  Disposition Plan: SNF when bed available  Consultants:  none  Procedures:  none  Microbiology: none  Antimicrobials: none   Objective: Vitals:   10/13/19 1426 10/13/19 2041 10/14/19 0512 10/14/19 0921  BP: (!) 109/57 111/60 (!) 117/54 124/69  Pulse: 75 75 63 85  Resp: 15 18 16 18   Temp: 97.8 F (36.6 C) 99.3 F (37.4 C) 98.3 F (36.8 C)   TempSrc: Oral Oral Oral   SpO2: 93% 96% 96% 96%  Weight:      Height:        Intake/Output Summary (Last 24 hours) at 10/14/2019 1330 Last data filed at 10/14/2019 1126 Gross per 24 hour  Intake  900 ml  Output 1675 ml  Net -775 ml   Filed Weights   10/07/19 1918  Weight: 63 kg    Examination:  Constitutional: NAD Eyes: no scleral icterus ENMT: mmm Neck: normal, supple Respiratory: cta Cardiovascular: rrr no edema  Data Reviewed: I have independently reviewed following labs and imaging studies   CBC: Recent Labs  Lab 10/09/19 0342 10/10/19 0321 10/11/19 0320 10/12/19 0258  10/13/19 0258  WBC 7.2 11.1* 11.4* 10.0 10.8*  NEUTROABS 6.4 10.2* 10.1* 8.6* 9.4*  HGB 9.0* 9.5* 10.2* 10.2* 10.3*  HCT 31.0* 31.0* 33.7* 33.6* 33.8*  MCV 92.3 89.9 89.2 88.9 87.6  PLT 181 289 285 327 315   Basic Metabolic Panel: Recent Labs  Lab 10/09/19 0342 10/10/19 0321 10/11/19 0320 10/12/19 0258 10/13/19 0258  NA 133* 135 134* 134* 134*  K 4.9 4.6 5.1 5.3* 5.0  CL 106 105 104 99 99  CO2 19* 21* 21* 25 24  GLUCOSE 280* 219* 230* 231* 269*  BUN 14 20 21  24* 27*  CREATININE 0.80 0.87 0.88 1.04* 0.92  CALCIUM 7.7* 8.2* 8.5* 9.0 9.2  MG 2.7* 2.3 2.1 2.0 2.0  PHOS 2.8 2.2* 2.1* 3.5 5.4*   GFR: Estimated Creatinine Clearance: 48.4 mL/min (by C-G formula based on SCr of 0.92 mg/dL). Liver Function Tests: Recent Labs  Lab 10/09/19 0342 10/10/19 0321 10/11/19 0320 10/12/19 0258 10/13/19 0258  AST 23 21 21 18 16   ALT 19 20 20 20 21   ALKPHOS 65 68 69 67 69  BILITOT 0.4 0.4 0.5 0.4 0.8  PROT 6.0* 6.2* 6.3* 6.3* 6.1*  ALBUMIN 2.7* 2.7* 2.9* 2.9* 3.1*   No results for input(s): LIPASE, AMYLASE in the last 168 hours. No results for input(s): AMMONIA in the last 168 hours. Coagulation Profile: No results for input(s): INR, PROTIME in the last 168 hours. Cardiac Enzymes: No results for input(s): CKTOTAL, CKMB, CKMBINDEX, TROPONINI in the last 168 hours. BNP (last 3 results) No results for input(s): PROBNP in the last 8760 hours. HbA1C: No results for input(s): HGBA1C in the last 72 hours. CBG: Recent Labs  Lab 10/13/19 1127 10/13/19 1630 10/13/19 2039 10/14/19 0813 10/14/19 1225  GLUCAP 192* 78 210* 206* 186*   Lipid Profile: No results for input(s): CHOL, HDL, LDLCALC, TRIG, CHOLHDL, LDLDIRECT in the last 72 hours. Thyroid Function Tests: No results for input(s): TSH, T4TOTAL, FREET4, T3FREE, THYROIDAB in the last 72 hours. Anemia Panel: Recent Labs    10/11/19 1431  VITAMINB12 1,454*  FOLATE 14.2  FERRITIN 86  TIBC 236*  IRON 44  RETICCTPCT 0.6    Urine analysis:    Component Value Date/Time   COLORURINE YELLOW 10/08/2019 0437   APPEARANCEUR HAZY (A) 10/08/2019 0437   LABSPEC 1.013 10/08/2019 0437   PHURINE 5.0 10/08/2019 0437   GLUCOSEU NEGATIVE 10/08/2019 0437   HGBUR SMALL (A) 10/08/2019 [place] 10/08/2019 [place] 10/08/2019 0437   PROTEINUR 30 (A) 10/08/2019 0437   UROBILINOGEN 1.0 02/03/2013 0912   NITRITE POSITIVE (A) 10/08/2019 0437   LEUKOCYTESUR LARGE (A) 10/08/2019 0437   Sepsis Labs: Invalid input(s): PROCALCITONIN, LACTICIDVEN  Recent Results (from the past 240 hour(s))  SARS CORONAVIRUS 2 (TAT 6-24 HRS) Nasopharyngeal Nasopharyngeal Swab     Status: Abnormal   Collection Time: 10/07/19 11:36 PM   Specimen: Nasopharyngeal Swab  Result Value Ref Range Status   SARS Coronavirus 2 POSITIVE (A) NEGATIVE Final    Comment: RESULT CALLED TO, READ BACK BY AND  VERIFIED WITH: Z TEETERS,RN 1537 10/08/2019 D BRADLEY (NOTE) SARS-CoV-2 target nucleic acids are DETECTED. The SARS-CoV-2 RNA is generally detectable in upper and lower respiratory specimens during the acute phase of infection. Positive results are indicative of the presence of SARS-CoV-2 RNA. Clinical correlation with patient history and other diagnostic information is  necessary to determine patient infection status. Positive results do not rule out bacterial infection or co-infection with other viruses.  The expected result is Negative. Fact Sheet for Patients: SugarRoll.be Fact Sheet for Healthcare Providers: https://www.woods-mathews.com/ This test is not yet approved or cleared by the [place] FDA and  has been authorized for detection and/or diagnosis of SARS-CoV-2 by FDA under an Emergency Use Authorization (EUA). This EUA will remain  in effect (meaning this test can be used) for  the duration of the COVID-19 declaration under Section 564(b)(1) of the Act, 21  U.S.C. section 360bbb-3(b)(1), unless the authorization is terminated or revoked sooner. Performed at [facility], [place] [address] [place], [place] [address]       Radiology Studies: No results found.   Marzetta Board, MD, PhD [facility]  Contact via  [email or web address]  [place] P: [phone] F: [phone]

## 2019-10-14 NOTE — Consult Note (Signed)
   [facility] CM Inpatient Consult   10/14/2019  Shandel Busic 08-Jul-1946 [id number]   [facility] Follow up:  Patient chart reviewed for [place]. Ms. Riffel had been active with [facility] prior to hospital admission. Per chart review, current disposition plan is for SNF. No [facility] CM needs at this time.  Netta Cedars, MSN, [facility] Liaison Nurse Mobile Phone [phone]  Toll free office [phone]

## 2019-10-14 NOTE — Care Management Important Message (Signed)
Important Message  Patient Details IM Letter given to [facility] Case Manager to present to the Patient Name: [name] MRN: [id number] Date of Birth: 07/28/1946   Medicare Important Message Given:  Yes     Kerin Salen 10/14/2019, 12:08 PM

## 2019-10-15 DIAGNOSIS — E118 Type 2 diabetes mellitus with unspecified complications: Secondary | ICD-10-CM | POA: Diagnosis not present

## 2019-10-15 DIAGNOSIS — Z79899 Other long term (current) drug therapy: Secondary | ICD-10-CM | POA: Diagnosis not present

## 2019-10-15 DIAGNOSIS — J449 Chronic obstructive pulmonary disease, unspecified: Secondary | ICD-10-CM | POA: Diagnosis not present

## 2019-10-15 DIAGNOSIS — Z743 Need for continuous supervision: Secondary | ICD-10-CM | POA: Diagnosis not present

## 2019-10-15 DIAGNOSIS — E875 Hyperkalemia: Secondary | ICD-10-CM | POA: Diagnosis not present

## 2019-10-15 DIAGNOSIS — M6281 Muscle weakness (generalized): Secondary | ICD-10-CM | POA: Diagnosis not present

## 2019-10-15 DIAGNOSIS — I2510 Atherosclerotic heart disease of native coronary artery without angina pectoris: Secondary | ICD-10-CM | POA: Diagnosis not present

## 2019-10-15 DIAGNOSIS — D631 Anemia in chronic kidney disease: Secondary | ICD-10-CM | POA: Diagnosis not present

## 2019-10-15 DIAGNOSIS — R531 Weakness: Secondary | ICD-10-CM | POA: Diagnosis not present

## 2019-10-15 DIAGNOSIS — I10 Essential (primary) hypertension: Secondary | ICD-10-CM

## 2019-10-15 DIAGNOSIS — Z23 Encounter for immunization: Secondary | ICD-10-CM | POA: Diagnosis not present

## 2019-10-15 DIAGNOSIS — I639 Cerebral infarction, unspecified: Secondary | ICD-10-CM | POA: Diagnosis not present

## 2019-10-15 DIAGNOSIS — I4901 Ventricular fibrillation: Secondary | ICD-10-CM | POA: Diagnosis not present

## 2019-10-15 DIAGNOSIS — E782 Mixed hyperlipidemia: Secondary | ICD-10-CM | POA: Diagnosis not present

## 2019-10-15 DIAGNOSIS — U071 COVID-19: Secondary | ICD-10-CM | POA: Diagnosis not present

## 2019-10-15 DIAGNOSIS — M2550 Pain in unspecified joint: Secondary | ICD-10-CM | POA: Diagnosis not present

## 2019-10-15 DIAGNOSIS — R296 Repeated falls: Secondary | ICD-10-CM | POA: Diagnosis not present

## 2019-10-15 DIAGNOSIS — I5032 Chronic diastolic (congestive) heart failure: Secondary | ICD-10-CM | POA: Diagnosis not present

## 2019-10-15 DIAGNOSIS — R262 Difficulty in walking, not elsewhere classified: Secondary | ICD-10-CM | POA: Diagnosis not present

## 2019-10-15 DIAGNOSIS — E1165 Type 2 diabetes mellitus with hyperglycemia: Secondary | ICD-10-CM | POA: Diagnosis not present

## 2019-10-15 DIAGNOSIS — E119 Type 2 diabetes mellitus without complications: Secondary | ICD-10-CM | POA: Diagnosis not present

## 2019-10-15 DIAGNOSIS — S0003XA Contusion of scalp, initial encounter: Secondary | ICD-10-CM | POA: Diagnosis not present

## 2019-10-15 DIAGNOSIS — M25551 Pain in right hip: Secondary | ICD-10-CM | POA: Diagnosis not present

## 2019-10-15 DIAGNOSIS — M25559 Pain in unspecified hip: Secondary | ICD-10-CM | POA: Diagnosis not present

## 2019-10-15 DIAGNOSIS — W19XXXA Unspecified fall, initial encounter: Secondary | ICD-10-CM | POA: Diagnosis not present

## 2019-10-15 DIAGNOSIS — K219 Gastro-esophageal reflux disease without esophagitis: Secondary | ICD-10-CM | POA: Diagnosis not present

## 2019-10-15 DIAGNOSIS — E871 Hypo-osmolality and hyponatremia: Secondary | ICD-10-CM | POA: Diagnosis not present

## 2019-10-15 DIAGNOSIS — Z7401 Bed confinement status: Secondary | ICD-10-CM | POA: Diagnosis not present

## 2019-10-15 DIAGNOSIS — E559 Vitamin D deficiency, unspecified: Secondary | ICD-10-CM | POA: Diagnosis not present

## 2019-10-15 DIAGNOSIS — G459 Transient cerebral ischemic attack, unspecified: Secondary | ICD-10-CM | POA: Diagnosis not present

## 2019-10-15 DIAGNOSIS — N179 Acute kidney failure, unspecified: Secondary | ICD-10-CM | POA: Diagnosis not present

## 2019-10-15 DIAGNOSIS — D649 Anemia, unspecified: Secondary | ICD-10-CM | POA: Diagnosis not present

## 2019-10-15 LAB — COMPREHENSIVE METABOLIC PANEL
ALT: 18 U/L (ref 0–44)
AST: 16 U/L (ref 15–41)
Albumin: 2.9 g/dL — ABNORMAL LOW (ref 3.5–5.0)
Alkaline Phosphatase: 71 U/L (ref 38–126)
Anion gap: 9 (ref 5–15)
BUN: 28 mg/dL — ABNORMAL HIGH (ref 8–23)
CO2: 25 mmol/L (ref 22–32)
Calcium: 8.9 mg/dL (ref 8.9–10.3)
Chloride: 99 mmol/L (ref 98–111)
Creatinine, Ser: 1.03 mg/dL — ABNORMAL HIGH (ref 0.44–1.00)
GFR calc Af Amer: >60 mL/min (ref >60)
GFR calc non Af Amer: 55 mL/min — ABNORMAL LOW (ref >60)
Glucose, Bld: 268 mg/dL — ABNORMAL HIGH (ref 70–99)
Potassium: 5.4 mmol/L — ABNORMAL HIGH (ref 3.5–5.1)
Sodium: 133 mmol/L — ABNORMAL LOW (ref 135–145)
Total Bilirubin: 0.4 mg/dL (ref 0.3–1.2)
Total Protein: 6.3 g/dL — ABNORMAL LOW (ref 6.5–8.1)

## 2019-10-15 LAB — CBC
HCT: 35.1 % — ABNORMAL LOW (ref 36.0–46.0)
Hemoglobin: 10.7 g/dL — ABNORMAL LOW (ref 12.0–15.0)
MCH: 27 pg (ref 26.0–34.0)
MCHC: 30.5 g/dL (ref 30.0–36.0)
MCV: 88.4 fL (ref 80.0–100.0)
Platelets: 332 10*3/uL (ref 150–400)
RBC: 3.97 MIL/uL (ref 3.87–5.11)
RDW: 17.7 % — ABNORMAL HIGH (ref 11.5–15.5)
WBC: 8.3 10*3/uL (ref 4.0–10.5)
nRBC: 0 % (ref 0.0–0.2)

## 2019-10-15 LAB — C-REACTIVE PROTEIN: CRP: 3.2 mg/dL — ABNORMAL HIGH (ref <1.0)

## 2019-10-15 LAB — GLUCOSE, CAPILLARY
Glucose-Capillary: 196 mg/dL — ABNORMAL HIGH (ref 70–99)
Glucose-Capillary: 175 mg/dL — ABNORMAL HIGH (ref 70–99)

## 2019-10-15 LAB — D-DIMER, QUANTITATIVE: D-Dimer, Quant: 1.19 ug/mL-FEU — ABNORMAL HIGH (ref 0.00–0.50)

## 2019-10-15 MED ORDER — DEXAMETHASONE 6 MG PO TABS: 6.0000 mg | ORAL_TABLET | Freq: Every day | ORAL | 0 refills | Status: DC

## 2019-10-15 MED ORDER — SODIUM ZIRCONIUM CYCLOSILICATE 5 G PO PACK
5.0000 g | PACK | Freq: Once | ORAL | Status: AC
  Administered 2019-10-15: 5 g via ORAL
  Filled 2019-10-15: qty 1

## 2019-10-15 NOTE — Discharge Summary (Signed)
Physician Discharge Summary  [name] MIW:[id number] DOB: 11/07/1945 DOA: 10/07/2019  PCP: [name], MD  Admit date: 10/07/2019 Discharge date: 10/15/2019  Admitted From: home Disposition:  SNF  Recommendations for Outpatient Follow-up:  1. Follow up with PCP in 1-2 weeks 2. Continue Decadron for 3 additional days  Home Health: None Equipment/Devices: None  Discharge Condition: Stable CODE STATUS: Full code Diet recommendation: Regular, diabetic  HPI: Per admitting MD, [name] is a 73 y.o. female with medical history significant of COPD, type 2 diabetes, hypertension, hyperlipidemia and previous stroke. Patient was brought to the hospital because of recurrent falls in the past few weeks.  She complained of 8/10 sharp, continuous, right hip pain, nonradiating, worsened by movement with no specific relieving factor.  Patiently, patient does not recollect circumstances leading to her falls and she thinks her legs give out on her.  She denies chest pain, headache, nausea, vomiting, diarrhea, polyuria, seizures or loss of consciousness.  Based on her recurrent falls, she was brought to ED for further evaluation.  Patient condition has been declining since the loss of her husband a few months ago. ED Course: In the ED, she was hemodynamically stable and afebrile.  Her oxygen saturation was 98% on room air.  Head CT was negative for acute intracranial process.  There was several small scalp hematoma over the right parietal lobe.  Hospital Course / Discharge diagnoses: COVID-19 viral illness-patient was admitted to the hospital and placed on remdesivir and steroids.  She improved significantly, was on room air, finished remdesivir while hospitalized and will complete a total of 10-day steroid course with 3 additional days remaining on discharge.  Inflammatory markers improving  COVID-19 Labs  Recent Labs    10/13/19 0258 10/15/19 0431  DDIMER 1.98* 1.19*    CRP 6.9* 3.2*    Lab Results  Component Value Date   SARSCOV2NAA POSITIVE (A) 10/07/2019   Type 2 diabetes mellitus, with hyperglycemia-continue home medications, she will be on Decadron for 3 additional days Acute kidney injury-resolved with IV fluids, creatinine stable.  Recheck a BMP in 3 to 4 days Hyponatremia-overall stable Anemia of chronic disease -Hemoglobin stable, no bleeding Bacteriuria -no UTI symptoms, hold antibiotics Hypertension -Continue home metoprolol Hyperlipidemia -continue statin Chronic diastolic CHF/history of Takotsubo cardiomyopathy -Appears euvolemic History of lung cancer s/p chemoradiation in remission -Outpatient follow-up History of CVA -Stable, no deficits Chronic COPD -Stable, no wheezing, continue inhalers and supportive care Depression/anxiety -Continue home medications  Discharge Instructions   Allergies as of 10/15/2019      Reactions   Lactose Intolerance (gi) Diarrhea   Ibuprofen Other (See Comments)   REACTION: Anxiousness, hyperventilates      Medication List    TAKE these medications   albuterol 108 (90 Base) MCG/ACT inhaler Commonly known as: VENTOLIN HFA INHALE 2 PUFFS BY MOUTH EVERY 6 HOURS AS NEEDED FOR WHEEZING OR SHORTNESS OF BREATH What changed: See the new instructions.   albuterol (2.5 MG/3ML) 0.083% nebulizer solution Commonly known as: PROVENTIL USE 1 VIAL IN NEBULIZER EVERY 4 HOURS AS NEEDED FOR WHEEZING AND FOR SHORTNESS OF BREATH What changed: See the new instructions.   aspirin 81 MG EC tablet Take 81 mg by mouth daily.   atorvastatin 40 MG tablet Commonly known as: LIPITOR Take 1 tablet (40 mg total) by mouth daily.   buPROPion 150 MG 24 hr tablet Commonly known as: WELLBUTRIN XL Take 1 tablet by mouth once daily   cetirizine 10 MG tablet Commonly  known as: ZYRTEC Take 10 mg by mouth daily.   dexamethasone 6 MG tablet Commonly known as: DECADRON Take 1 tablet (6 mg total) by mouth daily.    escitalopram 20 MG tablet Commonly known as: LEXAPRO Take 1 tablet by mouth once daily What changed:   how much to take  how to take this  when to take this  additional instructions   ferrous sulfate 325 (65 FE) MG EC tablet Take 325 mg by mouth 2 (two) times daily.   Flovent HFA 220 MCG/ACT inhaler Generic drug: fluticasone Inhale 2 puffs by mouth twice daily   HumuLIN 70/30 KwikPen (70-30) 100 UNIT/ML PEN Generic drug: Insulin Isophane & Regular Human Take 4 U SQ with breakfast and 2 U SQ with supper. What changed:   how much to take  how to take this  when to take this   ibandronate 150 MG tablet Commonly known as: Boniva Take 1 tablet (150 mg total) by mouth every 30 (thirty) days. Take in the morning with a full glass of water, on an empty stomach, and do not take anything else by mouth or lie down for the next 30 min.   metFORMIN 500 MG tablet Commonly known as: GLUCOPHAGE TAKE 1 TABLET BY MOUTH TWICE DAILY WITH A MEAL What changed: See the new instructions.   metoprolol tartrate 25 MG tablet Commonly known as: LOPRESSOR TAKE 1 TABLET BY MOUTH IN THE MORNING AND 2 AT BEDTIME What changed: See the new instructions.   ONE TOUCH ULTRA TEST test strip Generic drug: glucose blood USE  STRIP TO CHECK GLUCOSE THREE TIMES DAILY   OneTouch Delica Lancets 96E Misc USE 1  TO CHECK GLUCOSE THREE TIMES DAILY   pantoprazole 40 MG tablet Commonly known as: PROTONIX Take 1 tablet by mouth once daily   potassium chloride 10 MEQ tablet Commonly known as: KLOR-CON Take 1 tablet by mouth once daily       Consultations:  None   Procedures/Studies:  CT Head Wo Contrast  Result Date: 10/07/2019 CLINICAL DATA:  Fall.  Generalized weakness EXAM: CT HEAD WITHOUT CONTRAST TECHNIQUE: Contiguous axial images were obtained from the base of the skull through the vertex without intravenous contrast. COMPARISON:  Head CT 08/25/2018 FINDINGS: Brain: No acute intracranial  hemorrhage. No focal mass lesion. No CT evidence of acute infarction. No midline shift or mass effect. No hydrocephalus. Basilar cisterns are patent. There are periventricular and subcortical white matter hypodensities. Generalized cortical atrophy. Vascular: No hyperdense vessel or unexpected calcification. Skull: Normal. Negative for fracture or focal lesion. Sinuses/Orbits: Paranasal sinuses and mastoid air cells are clear. Orbits are clear. Other: Small scalp hematomas over the RIGHT parietal bone. IMPRESSION: 1. No acute intracranial findings. 2. No evidence of trauma. 3. Atrophy white matter microvascular disease. 4. Several small scalp hematomas over the RIGHT parietal bone. No skull fracture Electronically Signed   By: Suzy Bouchard M.D.   On: 10/07/2019 20:38   DG Chest Port 1 View  Result Date: 10/07/2019 CLINICAL DATA:  Weakness EXAM: PORTABLE CHEST 1 VIEW COMPARISON:  Eight AT 18 FINDINGS: Stable soft tissue density in the right paratracheal upper lobe, likely radiation changes. Volume loss on the right with elevation of the right hemidiaphragm, stable. Left lung clear. Heart is normal size. No effusions or acute bony abnormality. IMPRESSION: No acute cardiopulmonary disease. Electronically Signed   By: Rolm Baptise M.D.   On: 10/07/2019 20:45   DG HIP UNILAT WITH PELVIS 1V RIGHT  Result Date: 10/08/2019  CLINICAL DATA:  Pain in right hip status post multiple falls. EXAM: DG HIP (WITH OR WITHOUT PELVIS) 1V RIGHT COMPARISON:  None. FINDINGS: There is no evidence of hip fracture or dislocation. There is no evidence of arthropathy or other focal bone abnormality. IMPRESSION: Negative. Electronically Signed   By: Constance Holster M.D.   On: 10/08/2019 00:30      Subjective: Overall feeling well, awaiting SNF bed  Discharge Exam: BP (!) 106/59 (BP Location: Left Arm)   Pulse 76   Temp 98.3 F (36.8 C)   Resp 16   Ht 5\' 4"  (1.626 m)   Wt 63 kg   SpO2 93%   BMI 23.84 kg/m    General: Pt is alert, awake, not in acute distress Cardiovascular: RRR, S1/S2 +, no rubs, no gallops Respiratory: CTA bilaterally, no wheezing, no rhonchi   The results of significant diagnostics from this hospitalization (including imaging, microbiology, ancillary and laboratory) are listed below for reference.     Microbiology: Recent Results (from the past 240 hour(s))  SARS CORONAVIRUS 2 (TAT 6-24 HRS) Nasopharyngeal Nasopharyngeal Swab     Status: Abnormal   Collection Time: 10/07/19 11:36 PM   Specimen: Nasopharyngeal Swab  Result Value Ref Range Status   SARS Coronavirus 2 POSITIVE (A) NEGATIVE Final    Comment: RESULT CALLED TO, READ BACK BY AND VERIFIED WITH: Z TEETERS,RN 1537 10/08/2019 D BRADLEY (NOTE) SARS-CoV-2 target nucleic acids are DETECTED. The SARS-CoV-2 RNA is generally detectable in upper and lower respiratory specimens during the acute phase of infection. Positive results are indicative of the presence of SARS-CoV-2 RNA. Clinical correlation with patient history and other diagnostic information is  necessary to determine patient infection status. Positive results do not rule out bacterial infection or co-infection with other viruses.  The expected result is Negative. Fact Sheet for Patients: SugarRoll.be Fact Sheet for Healthcare Providers: https://www.woods-mathews.com/ This test is not yet approved or cleared by the [place] FDA and  has been authorized for detection and/or diagnosis of SARS-CoV-2 by FDA under an Emergency Use Authorization (EUA). This EUA will remain  in effect (meaning this test can be used) for  the duration of the COVID-19 declaration under Section 564(b)(1) of the Act, 21 U.S.C. section 360bbb-3(b)(1), unless the authorization is terminated or revoked sooner. Performed at [facility], [place] [address], [place], [place] [address]      Labs: Basic Metabolic Panel: Recent Labs   Lab 10/09/19 0342 10/10/19 0321 10/11/19 0320 10/12/19 0258 10/13/19 0258 10/15/19 0431  NA 133* 135 134* 134* 134* 133*  K 4.9 4.6 5.1 5.3* 5.0 5.4*  CL 106 105 104 99 99 99  CO2 19* 21* 21* 25 24 25   GLUCOSE 280* 219* 230* 231* 269* 268*  BUN 14 20 21  24* 27* 28*  CREATININE 0.80 0.87 0.88 1.04* 0.92 1.03*  CALCIUM 7.7* 8.2* 8.5* 9.0 9.2 8.9  MG 2.7* 2.3 2.1 2.0 2.0  --   PHOS 2.8 2.2* 2.1* 3.5 5.4*  --    Liver Function Tests: Recent Labs  Lab 10/10/19 0321 10/11/19 0320 10/12/19 0258 10/13/19 0258 10/15/19 0431  AST 21 21 18 16 16   ALT 20 20 20 21 18   ALKPHOS 68 69 67 69 71  BILITOT 0.4 0.5 0.4 0.8 0.4  PROT 6.2* 6.3* 6.3* 6.1* 6.3*  ALBUMIN 2.7* 2.9* 2.9* 3.1* 2.9*   CBC: Recent Labs  Lab 10/09/19 0342 10/10/19 0321 10/11/19 0320 10/12/19 0258 10/13/19 0258 10/15/19 0431  WBC 7.2 11.1*  11.4* 10.0 10.8* 8.3  NEUTROABS 6.4 10.2* 10.1* 8.6* 9.4*  --   HGB 9.0* 9.5* 10.2* 10.2* 10.3* 10.7*  HCT 31.0* 31.0* 33.7* 33.6* 33.8* 35.1*  MCV 92.3 89.9 89.2 88.9 87.6 88.4  PLT 181 289 285 327 313 332   CBG: Recent Labs  Lab 10/14/19 0813 10/14/19 1225 10/14/19 1619 10/14/19 1952 10/15/19 0821  GLUCAP 206* 186* 89 145* 196*   Hgb A1c No results for input(s): HGBA1C in the last 72 hours. Lipid Profile No results for input(s): CHOL, HDL, LDLCALC, TRIG, CHOLHDL, LDLDIRECT in the last 72 hours. Thyroid function studies No results for input(s): TSH, T4TOTAL, T3FREE, THYROIDAB in the last 72 hours.  Invalid input(s): FREET3 Urinalysis    Component Value Date/Time   COLORURINE YELLOW 10/08/2019 0437   APPEARANCEUR HAZY (A) 10/08/2019 0437   LABSPEC 1.013 10/08/2019 0437   PHURINE 5.0 10/08/2019 0437   GLUCOSEU NEGATIVE 10/08/2019 0437   HGBUR SMALL (A) 10/08/2019 0437   BILIRUBINUR NEGATIVE 10/08/2019 0437   KETONESUR NEGATIVE 10/08/2019 0437   PROTEINUR 30 (A) 10/08/2019 0437   UROBILINOGEN 1.0 02/03/2013 0912   NITRITE POSITIVE (A) 10/08/2019 0437    LEUKOCYTESUR LARGE (A) 10/08/2019 0437    FURTHER DISCHARGE INSTRUCTIONS:   Get Medicines reviewed and adjusted: Please take all your medications with you for your next visit with your Primary MD   Laboratory/radiological data: Please request your Primary MD to go over all hospital tests and procedure/radiological results at the follow up, please ask your Primary MD to get all Hospital records sent to his/her office.   In some cases, they will be blood work, cultures and biopsy results pending at the time of your discharge. Please request that your primary care M.D. goes through all the records of your hospital data and follows up on these results.   Also Note the following: If you experience worsening of your admission symptoms, develop shortness of breath, life threatening emergency, suicidal or homicidal thoughts you must seek medical attention immediately by calling 911 or calling your MD immediately  if symptoms less severe.   You must read complete instructions/literature along with all the possible adverse reactions/side effects for all the Medicines you take and that have been prescribed to you. Take any new Medicines after you have completely understood and accpet all the possible adverse reactions/side effects.    Do not drive when taking Pain medications or sleeping medications (Benzodaizepines)   Do not take more than prescribed Pain, Sleep and Anxiety Medications. It is not advisable to combine anxiety,sleep and pain medications without talking with your primary care practitioner   Special Instructions: If you have smoked or chewed Tobacco  in the last 2 yrs please stop smoking, stop any regular Alcohol  and or any Recreational drug use.   Wear Seat belts while driving.   Please note: You were cared for by a hospitalist during your hospital stay. Once you are discharged, your primary care physician will handle any further medical issues. Please note that NO REFILLS for any  discharge medications will be authorized once you are discharged, as it is imperative that you return to your primary care physician (or establish a relationship with a primary care physician if you do not have one) for your post hospital discharge needs so that they can reassess your need for medications and monitor your lab values.  Time coordinating discharge: 45 minutes  SIGNED:  Marzetta Board, MD, PhD 10/15/2019, 9:34 AM

## 2019-10-15 NOTE — TOC Transition Note (Signed)
Transition of Care [facility]) - CM/SW Discharge Note   Patient Details  Name: [name] MRN: [id number] Date of Birth: Feb 22, 1946  [facility] CM/SW Contact:  Wende Neighbors, [name] Phone Number: 10/15/2019, 11:09 AM   Clinical Narrative:   Patient going to [facility] via [facility] [place]. RN to call [phone] for report. PTAR set up for a 12:30 pick up     Final next level of care: [place] Barriers to Discharge: No Barriers Identified   Patient Goals and CMS Choice        Discharge Placement              Patient chooses bed at: [facility] Patient to be transferred to facility by: ptar Name of family member notified: daughter Patient and family notified of of transfer: 10/15/19  Discharge Plan and Services                                     Social Determinants of Health (SDOH) Interventions     Readmission Risk Interventions No flowsheet data found.

## 2019-10-15 NOTE — Final Progress Note (Signed)
NURSING PROGRESS NOTE  [name] [id number] Discharge Data: 10/15/2019 11:28 AM Attending Provider: Caren Griffins, MD [id number], [name], MD     [name] to be D/C'd Skilled nursing facility [facility] per MD order.  Discussed with the patient the After Visit Summary and all questions fully answered. All IV's discontinued with no bleeding noted. All belongings returned to patient for patient to take home.  Report was called and given to Norvel Richards, LPN at [facility]. Waiting for [facility] for transfer  Last Vital Signs:  Blood pressure (!) 106/59, pulse 76, temperature 98.3 F (36.8 C), resp. rate 16, height 5\' 4"  (1.626 m), weight 63 kg, SpO2 93 %.  Discharge Medication List Allergies as of 10/15/2019      Reactions   Lactose Intolerance (gi) Diarrhea   Ibuprofen Other (See Comments)   REACTION: Anxiousness, hyperventilates      Medication List    TAKE these medications   albuterol 108 (90 Base) MCG/ACT inhaler Commonly known as: VENTOLIN HFA INHALE 2 PUFFS BY MOUTH EVERY 6 HOURS AS NEEDED FOR WHEEZING OR SHORTNESS OF BREATH What changed: See the new instructions.   albuterol (2.5 MG/3ML) 0.083% nebulizer solution Commonly known as: PROVENTIL USE 1 VIAL IN NEBULIZER EVERY 4 HOURS AS NEEDED FOR WHEEZING AND FOR SHORTNESS OF BREATH What changed: See the new instructions.   aspirin 81 MG EC tablet Take 81 mg by mouth daily.   atorvastatin 40 MG tablet Commonly known as: LIPITOR Take 1 tablet (40 mg total) by mouth daily.   buPROPion 150 MG 24 hr tablet Commonly known as: WELLBUTRIN XL Take 1 tablet by mouth once daily   cetirizine 10 MG tablet Commonly known as: ZYRTEC Take 10 mg by mouth daily.   dexamethasone 6 MG tablet Commonly known as: DECADRON Take 1 tablet (6 mg total) by mouth daily.   escitalopram 20 MG tablet Commonly known as: LEXAPRO Take 1 tablet by mouth once daily What changed:   how much to take  how to take  this  when to take this  additional instructions   ferrous sulfate 325 (65 FE) MG EC tablet Take 325 mg by mouth 2 (two) times daily.   Flovent HFA 220 MCG/ACT inhaler Generic drug: fluticasone Inhale 2 puffs by mouth twice daily   HumuLIN 70/30 KwikPen (70-30) 100 UNIT/ML PEN Generic drug: Insulin Isophane & Regular Human Take 4 U SQ with breakfast and 2 U SQ with supper. What changed:   how much to take  how to take this  when to take this   ibandronate 150 MG tablet Commonly known as: Boniva Take 1 tablet (150 mg total) by mouth every 30 (thirty) days. Take in the morning with a full glass of water, on an empty stomach, and do Rachael take anything else by mouth or lie down for the next 30 min.   metFORMIN 500 MG tablet Commonly known as: GLUCOPHAGE TAKE 1 TABLET BY MOUTH TWICE DAILY WITH A MEAL What changed: See the new instructions.   metoprolol tartrate 25 MG tablet Commonly known as: LOPRESSOR TAKE 1 TABLET BY MOUTH IN THE MORNING AND 2 AT BEDTIME What changed: See the new instructions.   ONE TOUCH ULTRA TEST test strip Generic drug: glucose blood USE  STRIP TO CHECK GLUCOSE THREE TIMES DAILY   OneTouch Delica Lancets 82X Misc USE 1  TO CHECK GLUCOSE THREE TIMES DAILY   pantoprazole 40 MG tablet Commonly known as: PROTONIX Take 1 tablet by mouth  once daily   potassium chloride 10 MEQ tablet Commonly known as: KLOR-CON Take 1 tablet by mouth once daily

## 2019-10-15 NOTE — Plan of Care (Signed)
  Problem: Education: Goal: Knowledge of General Education information will improve Description: Including pain rating scale, medication(s)/side effects and non-pharmacologic comfort measures Outcome: Adequate for Discharge   Problem: Health Behavior/Discharge Planning: Goal: Ability to manage health-related needs will improve Outcome: Adequate for Discharge   Problem: Clinical Measurements: Goal: Ability to maintain clinical measurements within normal limits will improve Outcome: Adequate for Discharge Goal: Will remain free from infection Outcome: Adequate for Discharge Goal: Diagnostic test results will improve Outcome: Adequate for Discharge Goal: Respiratory complications will improve Outcome: Adequate for Discharge Goal: Cardiovascular complication will be avoided Outcome: Adequate for Discharge   Problem: Activity: Goal: Risk for activity intolerance will decrease Outcome: Adequate for Discharge   Problem: Nutrition: Goal: Adequate nutrition will be maintained Outcome: Adequate for Discharge   Problem: Coping: Goal: Level of anxiety will decrease Outcome: Adequate for Discharge   Problem: Elimination: Goal: Will not experience complications related to bowel motility Outcome: Adequate for Discharge   Problem: Safety: Goal: Ability to remain free from injury will improve Outcome: Adequate for Discharge   Problem: Skin Integrity: Goal: Risk for impaired skin integrity will decrease Outcome: Adequate for Discharge

## 2019-10-17 DIAGNOSIS — R296 Repeated falls: Secondary | ICD-10-CM | POA: Diagnosis not present

## 2019-10-17 DIAGNOSIS — J449 Chronic obstructive pulmonary disease, unspecified: Secondary | ICD-10-CM | POA: Diagnosis not present

## 2019-10-17 DIAGNOSIS — U071 COVID-19: Secondary | ICD-10-CM | POA: Diagnosis not present

## 2019-10-17 DIAGNOSIS — R531 Weakness: Secondary | ICD-10-CM | POA: Diagnosis not present

## 2019-10-23 DIAGNOSIS — E119 Type 2 diabetes mellitus without complications: Secondary | ICD-10-CM | POA: Diagnosis not present

## 2019-10-23 DIAGNOSIS — U071 COVID-19: Secondary | ICD-10-CM | POA: Diagnosis not present

## 2019-10-23 DIAGNOSIS — I10 Essential (primary) hypertension: Secondary | ICD-10-CM | POA: Diagnosis not present

## 2019-10-23 DIAGNOSIS — R531 Weakness: Secondary | ICD-10-CM | POA: Diagnosis not present

## 2019-10-31 ENCOUNTER — Ambulatory Visit: Payer: Medicare Other | Admitting: Family Medicine

## 2019-10-31 NOTE — Progress Notes (Deleted)
Virtual Visit via Video Note  I connected with pt on 10/31/19 at  3:30 PM EST by a video enabled telemedicine application and verified that I am speaking with the correct person using two identifiers.  Location patient: home Location provider:work or home office Persons participating in the virtual visit: patient, provider  I discussed the limitations of evaluation and management by telemedicine and the availability of in person appointments. The patient expressed understanding and agreed to proceed.  Telemedicine visit is a necessity given the COVID-19 restrictions in place at the current time.  HPI: 74 y/o WF being seen today for 1 mo f/u dementia, unstable gait with frequent falls, CRI III, and HTN.   ROS: See pertinent positives and negatives per HPI.  Past Medical History:  Diagnosis Date  . Age-related nuclear cataract of both eyes 2016   +cortical age related cataracts OU   . Anxiety   . Arthritis    hands  . Bilateral diabetic retinopathy (HCC) 2015   Dr. Zigmund Daniel  . Blood transfusion without reported diagnosis    when taking chemo  . Chronic renal insufficiency, stage 3 (moderate)    GFR 50s  . 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  Marland Kitchen COPD (chronic obstructive pulmonary disease) ([place])   . CVA (cerebral vascular accident) ([place]) 09/2016   MRI did show a left-sided small ischemic stroke which was acute but likely incidental (MRI was done b/c pt had TIA sx's in hospital).  Neuro put her on plavix at that time.  Cardiology d/c'd her plavix 08/2017.  . Depression   . Diabetes mellitus with complication (HCC)    diab retinopathy OU (laser)  . GERD (gastroesophageal reflux disease)    protonix  . History of concussion 08/25/2018   w/out loss of consciousness--mild increase in baseline memory impairment after.  CT head neg acute.  Marland Kitchen History of diverticulitis of colon    with abscess; required IR percutaneous drain placed 09/2016.  Marland Kitchen  Hyperlipidemia   . Hypertension   . Iron deficiency anemia    started iron 07/02/19.  EGD/colonoscopy 08/27/19 unrevealing. Capsule endoscopy to be done.  . Left bundle branch block (LBBB) 12/16/2016  . Lung cancer ([place])    non-small cell lung ca, stage III in 05/2011; systemic chemotherapy concurrent with radiation followed by prophylactic cranial irradiation and has been observation since July of 2010 with no evidence for disease recurrence-released from onc f/u 12/2014 (needs annual cxr by PCP).  CXR 08/2015 stable.  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 Earlie Server did bx and this was NEG for malignancy.  . Mild cognitive impairment with memory loss    Likely from brain radiation therapy  . Non-obstructive CAD (coronary artery disease)    a. Cath 2006 preserved LV fxn, scattered irregularities without critical stenosis; b. 2008 stress echo negative for ischemia, but with hypertensive response; c. 08/2016 Cath: D1 25%, otw nl.  . Normocytic anemia 2018   Iron, vit B12, folate all normal 12/2016.  Marland Kitchen Open-angle glaucoma 2016   Dr. Shirley Muscat, (bilateral)---responding to topical therapy  . Osteoporosis 03/2016; 03/2018   03/2016 DEXA T-score -2.2.  03/2018 T-score L femoral neck  -2.7--boniva started.  . Pneumonia    hospitalization 11/2016; hypoxemic resp failure--d/c'd home with home oxygen therapy  . Seasonal allergic rhinitis   . Subclinical hyperthyroidism 2018  . Takotsubo cardiomyopathy    a. 08/2016 Echo: EF 30-35%, gr1DD, PASP 73mHg;  b. 08/2016 Cath: nonobs Dzs;  c. 11/2016 Echo: EF  40-45%, no rwma, nl RV fxn, mild TR, PASP 52mHg.  ECHO 02/2017 EF 50-55%, grd II DD---essentially resolved Takotsubo 02/2017.  . Torsades de pointes ([place] 08/2016   a. 08/2016 in setting of diverticulitis & pneumoperitoneum and Takotsubo CM -->prolonged QT, seen by EP-->avoid meds with potential for QT prolongation.    Past Surgical History:  Procedure Laterality Date  . ABDOMINAL HYSTERECTOMY  1997  . APPENDECTOMY     . CARDIAC CATHETERIZATION N/A 09/14/2016   Procedure: Left Heart Cath and Coronary Angiography;  Surgeon: TTroy Sine MD;  [facility];  Service: Cardiovascular; Mild nonobstructive CAD with 85% smooth narrowing in the first diagonal branch of the LAD; 10% smooth narrowing of the ostial proximal left circumflex coronary artery; and a normal dominant RCA.  .Marland KitchenCARPAL TUNNEL RELEASE    . CATARACT EXTRACTION Bilateral   . CHOLECYSTECTOMY  2002  . COLONOSCOPY  10/24/00; 08/27/19   2002 normal.  [employer] hemoccult testing via [facility] 06/18/15 was NEG.  2020->diverticulosis o/w nl.  . dexa  03/2016; 03/2018   03/2018 T-score L femoral neck  -2.7 (worsened compared to osteopenic range 2017.)  . ESOPHAGOGASTRODUODENOSCOPY  08/27/2019   Normal.  . IR GENERIC HISTORICAL  09/19/2016   IR SINUS/FIST TUBE CHK-NON GI 09/19/2016 JCorrie Mckusick DO [facility]  . IR GENERIC HISTORICAL  10/06/2016   IR RADIOLOGIST EVAL & MGMT 10/06/2016 [facility]  . IR GENERIC HISTORICAL  10/26/2016   IR RADIOLOGIST EVAL & MGMT 10/26/2016 [facility]  . IR GENERIC HISTORICAL  11/03/2016   IR RADIOLOGIST EVAL & MGMT 11/03/2016 WArdis Rowan PA-C [facility]  . IR GENERIC HISTORICAL  11/24/2016   IR RADIOLOGIST EVAL & MGMT 11/24/2016 WArdis Rowan PA-C [facility]  . IR GENERIC HISTORICAL  12/05/2016   Fistula smaller/improving.  IR SINUS/FIST TUBE CHK-NON GI 12/05/2016 JSandi Mariscal MD [facility]  . IR GENERIC HISTORICAL  12/22/2016   IR RADIOLOGIST EVAL & MGMT 12/22/2016 MGreggory Keen MD [facility]  . IR RADIOLOGIST EVAL & MGMT  01/10/2017  . IR RADIOLOGIST EVAL & MGMT  02/09/2017  . IR SINUS/FIST TUBE CHK-NON GI  03/28/2017  . LEFT HEART CATHETERIZATION WITH CORONARY ANGIOGRAM N/A 05/30/2012   Procedure: LEFT HEART CATHETERIZATION WITH CORONARY ANGIOGRAM;  Surgeon: CBurnell Blanks MD;  [facility];  Service: Cardiovascular: Mild, non-obstructive CAD  .  TRANSTHORACIC ECHOCARDIOGRAM  09/14/2016; 11/2016   08/2016: EF 30-35 %, Akinesis of the mid-apicalanteroseptal myocardium.  Grade I DD.  Mild pulm HTN.  Repeat echo 11/2016, EF 40-45%, no rwma, nl RV fxn, mild TR, PASP 322mg.    . Marland KitchenRANSTHORACIC ECHOCARDIOGRAM  03/07/2017   EF 50-55%. Hypokinesis of the distal septum with overall low normal LV,  systolic function; mild diastolic dysfunction with elevated LV  filling pressure; mildly calcified aortic valve with mild AI; small pericardial effusion    Family History  Problem Relation Age of Onset  . Heart failure Mother   . Kidney disease Mother        renal failure  . Diabetes Mother   . Diabetes Father   . Diabetes Brother   . Heart disease Brother   . Heart attack Brother   . Heart attack Brother   . Heart attack Brother   . Pancreatic cancer Brother   . Colon cancer Neg Hx   . Esophageal cancer Neg Hx   . Stomach cancer Neg Hx   . Rectal cancer  Neg Hx     SOCIAL HX: ***   Current Outpatient Medications:  .  albuterol (PROVENTIL HFA;VENTOLIN HFA) 108 (90 Base) MCG/ACT inhaler, INHALE 2 PUFFS BY MOUTH EVERY 6 HOURS AS NEEDED FOR WHEEZING OR SHORTNESS OF BREATH (Patient taking differently: Inhale 2 puffs into the lungs every 6 (six) hours as needed for wheezing or shortness of breath. ), Disp: 18 g, Rfl: 0 .  albuterol (PROVENTIL) (2.5 MG/3ML) 0.083% nebulizer solution, USE 1 VIAL IN NEBULIZER EVERY 4 HOURS AS NEEDED FOR WHEEZING AND FOR SHORTNESS OF BREATH (Patient taking differently: Take 2.5 mg by nebulization every 4 (four) hours as needed for wheezing or shortness of breath. ), Disp: 75 mL, Rfl: 0 .  aspirin 81 MG EC tablet, Take 81 mg by mouth daily. , Disp: , Rfl:  .  atorvastatin (LIPITOR) 40 MG tablet, Take 1 tablet (40 mg total) by mouth daily., Disp: 90 tablet, Rfl: 3 .  buPROPion (WELLBUTRIN XL) 150 MG 24 hr tablet, Take 1 tablet by mouth once daily (Patient taking differently: Take 150 mg by mouth daily. ), Disp: 30 tablet,  Rfl: 0 .  cetirizine (ZYRTEC) 10 MG tablet, Take 10 mg by mouth daily., Disp: , Rfl:  .  dexamethasone (DECADRON) 6 MG tablet, Take 1 tablet (6 mg total) by mouth daily., Disp: 3 tablet, Rfl: 0 .  escitalopram (LEXAPRO) 20 MG tablet, Take 1 tablet by mouth once daily (Patient taking differently: Take 20 mg by mouth daily. ), Disp: 90 tablet, Rfl: 1 .  ferrous sulfate 325 (65 FE) MG EC tablet, Take 325 mg by mouth 2 (two) times daily., Disp: , Rfl:  .  FLOVENT HFA 220 MCG/ACT inhaler, Inhale 2 puffs by mouth twice daily (Patient taking differently: Inhale 2 puffs into the lungs 2 (two) times daily. ), Disp: 12 g, Rfl: 0 .  ibandronate (BONIVA) 150 MG tablet, Take 1 tablet (150 mg total) by mouth every 30 (thirty) days. Take in the morning with a full glass of water, on an empty stomach, and do not take anything else by mouth or lie down for the next 30 min., Disp: 3 tablet, Rfl: 3 .  Insulin Isophane & Regular Human (HUMULIN 70/30 KWIKPEN) (70-30) 100 UNIT/ML PEN, Take 4 U SQ with breakfast and 2 U SQ with supper. (Patient taking differently: Inject 2-4 Units into the skin See admin instructions. Take 4 U SQ with breakfast and 2 U SQ with supper.), Disp: 15 pen, Rfl: 1 .  metFORMIN (GLUCOPHAGE) 500 MG tablet, TAKE 1 TABLET BY MOUTH TWICE DAILY WITH A MEAL (Patient taking differently: Take 500 mg by mouth 2 (two) times daily with a meal. ), Disp: 180 tablet, Rfl: 0 .  metoprolol tartrate (LOPRESSOR) 25 MG tablet, TAKE 1 TABLET BY MOUTH IN THE MORNING AND 2 AT BEDTIME (Patient taking differently: Take 25-50 mg by mouth See admin instructions. Take one tablet in the morning, then take 2 tablets in the evening per daughter), Disp: 270 tablet, Rfl: 1 .  ONE TOUCH ULTRA TEST test strip, USE  STRIP TO CHECK GLUCOSE THREE TIMES DAILY, Disp: 100 each, Rfl: 11 .  ONETOUCH DELICA LANCETS 07M MISC, USE 1  TO CHECK GLUCOSE THREE TIMES DAILY, Disp: 100 each, Rfl: 11 .  pantoprazole (PROTONIX) 40 MG tablet, Take 1  tablet by mouth once daily (Patient taking differently: Take 40 mg by mouth daily. ), Disp: 90 tablet, Rfl: 0 .  potassium chloride (KLOR-CON) 10 MEQ tablet, Take 1 tablet by mouth  once daily (Patient taking differently: Take 10 mEq by mouth daily. ), Disp: 90 tablet, Rfl: 0  EXAM:  VITALS per patient if applicable: There were no vitals taken for this visit.   GENERAL: alert, oriented, appears well and in no acute distress  HEENT: atraumatic, conjunttiva clear, no obvious abnormalities on inspection of external nose and ears  NECK: normal movements of the head and neck  LUNGS: on inspection no signs of respiratory distress, breathing rate appears normal, no obvious gross SOB, gasping or wheezing  CV: no obvious cyanosis  MS: moves all visible extremities without noticeable abnormality  PSYCH/NEURO: pleasant and cooperative, no obvious depression or anxiety, speech and thought processing grossly intact  LABS: none today    Chemistry      Component Value Date/Time   NA 133 (L) 10/15/2019 0431   NA 141 09/19/2018 0918   NA 141 01/15/2015 0848   K 5.4 (H) 10/15/2019 0431   K 4.0 01/15/2015 0848   CL 99 10/15/2019 0431   CL 105 02/18/2013 0812   CO2 25 10/15/2019 0431   CO2 25 01/15/2015 0848   BUN 28 (H) 10/15/2019 0431   BUN 16 09/19/2018 0918   BUN 9.0 01/15/2015 0848   CREATININE 1.03 (H) 10/15/2019 0431   CREATININE 1.01 (H) 08/10/2018 1611   CREATININE 0.7 01/15/2015 0848      Component Value Date/Time   CALCIUM 8.9 10/15/2019 0431   CALCIUM 9.2 01/15/2015 0848   ALKPHOS 71 10/15/2019 0431   ALKPHOS 92 01/15/2015 0848   AST 16 10/15/2019 0431   AST 19 01/15/2015 0848   ALT 18 10/15/2019 0431   ALT 20 01/15/2015 0848   BILITOT 0.4 10/15/2019 0431   BILITOT 0.3 09/19/2018 0918   BILITOT 0.34 01/15/2015 0848     Lab Results  Component Value Date   WBC 8.3 10/15/2019   HGB 10.7 (L) 10/15/2019   HCT 35.1 (L) 10/15/2019   MCV 88.4 10/15/2019   PLT 332  10/15/2019   Lab Results  Component Value Date   IRON 44 10/11/2019   TIBC 236 (L) 10/11/2019   FERRITIN 86 10/11/2019   Lab Results  Component Value Date   VITAMINB12 1,454 (H) 10/11/2019   Lab Results  Component Value Date   TSH 1.24 04/20/2017   Lab Results  Component Value Date   HGBA1C 5.1 08/29/2019   Lab Results  Component Value Date   CHOL 111 09/19/2018   HDL 34 (L) 09/19/2018   LDLCALC 48 09/19/2018   LDLDIRECT 162.0 07/17/2015   TRIG 147 09/19/2018   CHOLHDL 3.3 09/19/2018    ASSESSMENT AND PLAN:  Discussed the following assessment and plan:  No diagnosis found.  -we discussed possible serious and likely etiologies, options for evaluation and workup, limitations of telemedicine visit vs in person visit, treatment, treatment risks and precautions. Pt prefers to treat via telemedicine empirically rather then risking or undertaking an in person visit at this moment. Patient agrees to seek prompt in person care if worsening, new symptoms arise, or if is not improving with treatment.   I discussed the assessment and treatment plan with the patient. The patient was provided an opportunity to ask questions and all were answered. The patient agreed with the plan and demonstrated an understanding of the instructions.   The patient was advised to call back or seek an in-person evaluation if the symptoms worsen or if the condition fails to improve as anticipated.  F/u: ***  Signed:  Crissie Sickles,  MD           10/31/2019

## 2019-11-05 DIAGNOSIS — U071 COVID-19: Secondary | ICD-10-CM | POA: Diagnosis not present

## 2019-11-05 DIAGNOSIS — M25559 Pain in unspecified hip: Secondary | ICD-10-CM | POA: Diagnosis not present

## 2019-11-05 DIAGNOSIS — R296 Repeated falls: Secondary | ICD-10-CM | POA: Diagnosis not present

## 2019-11-05 DIAGNOSIS — R531 Weakness: Secondary | ICD-10-CM | POA: Diagnosis not present

## 2019-11-07 ENCOUNTER — Encounter: Payer: Self-pay | Admitting: Family Medicine

## 2019-11-07 ENCOUNTER — Other Ambulatory Visit: Payer: Self-pay

## 2019-11-07 ENCOUNTER — Ambulatory Visit (INDEPENDENT_AMBULATORY_CARE_PROVIDER_SITE_OTHER): Payer: Medicare Other | Admitting: Family Medicine

## 2019-11-07 VITALS — BP 109/66 | HR 94

## 2019-11-07 DIAGNOSIS — D500 Iron deficiency anemia secondary to blood loss (chronic): Secondary | ICD-10-CM | POA: Diagnosis not present

## 2019-11-07 DIAGNOSIS — E118 Type 2 diabetes mellitus with unspecified complications: Secondary | ICD-10-CM | POA: Diagnosis not present

## 2019-11-07 DIAGNOSIS — R5381 Other malaise: Secondary | ICD-10-CM

## 2019-11-07 DIAGNOSIS — U071 COVID-19: Secondary | ICD-10-CM

## 2019-11-07 DIAGNOSIS — N183 Chronic kidney disease, stage 3 unspecified: Secondary | ICD-10-CM | POA: Diagnosis not present

## 2019-11-07 NOTE — Progress Notes (Signed)
Virtual Visit via Video Note  I connected with pt on 11/07/19 at 11:30 AM EST by a video enabled telemedicine application and verified that I am speaking with the correct person using two identifiers.  Location patient: home Location provider:work or home office Persons participating in the virtual visit: patient, provider  I discussed the limitations of evaluation and management by telemedicine and the availability of in person appointments. The patient expressed understanding and agreed to proceed.  Telemedicine visit is a necessity given the COVID-19 restrictions in place at the current time.  HPI: 74 y/o WF being seen today accompanied by grandson for f/u multiple chronic medical problems.  A/P as of her visit 2 mo ago: "1) Grief response: reassured.  No med changes. Warned about signs of SI or HI= medical emergency.  2) COPD: symptoms fairly well controlled with albuterol use but she is resistant to using neb/inhaler sometimes.    3) Iron def anemia, heme+ 06/19/19: has been on iron 2 mo so we'll recheck cbc and iron panel. EGD/colonoscopy unrevealing.  Capsule endoscopy to be done per GI MD. If no change in iron levels on her check today then will recommend IV iron. Vit b12 and folate today (?malabsorption syndrome).  4) DM 2: home glucoses fine.  HbA1c today.  No insulin dose changes at this time but family knows they can down-titrate her insulin if her PO intake drops to the extent that she's having low glucoses.  5) CRI III: she doesn't take NSAIDs.  Her hydration sounds borderline at best.  BMET today. Will need to d/c her metformin if GFR <45 ml/min.  She needs HH for PT/OT and we'll see if she can get some resp therapy attention as well as HH aid/sitter."  INTERIM HX: Grandson assisting with hx today.  Daughter not with them. Admitted to hosp for general progressive debilitation with recurrent falls, was covid+-> 12/14-12/22, 2020-->d/c'd to SNF.  Got home from  SNF 2 d/a. K + was up upon admission to hosp, still taking 10 mEQ daily. Not in pain anywhere.  Breathing feels good--no cough or SOB or CP. Eating and drinking fine since being home. No low glucoses.  Avg gluc sounds like 150 or so.  She is still taking metformin. Taking 1 iron tab per day.  No melena or hematochezia. IDA->Givens study not completed b/c they never were able to find the capsule after she ingested it.  ROS: no fevers, no CP, no SOB, no wheezing, no cough, no dizziness, no HAs, no rashes, no melena/hematochezia.  No polyuria or polydipsia.  No myalgias or arthralgias.    Past Medical History:  Diagnosis Date  . Age-related nuclear cataract of both eyes 2016   +cortical age related cataracts OU   . Anxiety   . Arthritis    hands  . Bilateral diabetic retinopathy (HCC) 2015   Dr. Zigmund Daniel  . Blood transfusion without reported diagnosis    when taking chemo  . Chronic renal insufficiency, stage 3 (moderate)    GFR 50s  . 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  Marland Kitchen COPD (chronic obstructive pulmonary disease) ([place])   . CVA (cerebral vascular accident) ([place]) 09/2016   MRI did show a left-sided small ischemic stroke which was acute but likely incidental (MRI was done b/c pt had TIA sx's in hospital).  Neuro put her on plavix at that time.  Cardiology d/c'd her plavix 08/2017.  . Depression   . Diabetes mellitus with complication ([place])  diab retinopathy OU (laser)  . GERD (gastroesophageal reflux disease)    protonix  . History of concussion 08/25/2018   w/out loss of consciousness--mild increase in baseline memory impairment after.  CT head neg acute.  Marland Kitchen History of diverticulitis of colon    with abscess; required IR percutaneous drain placed 09/2016.  Marland Kitchen Hyperlipidemia   . Hypertension   . Iron deficiency anemia    started iron 07/02/19.  EGD/colonoscopy 08/27/19 unrevealing. Capsule endoscopy to be done.  . Left bundle branch  block (LBBB) 12/16/2016  . Lung cancer ([place])    non-small cell lung ca, stage III in 05/2011; systemic chemotherapy concurrent with radiation followed by prophylactic cranial irradiation and has been observation since July of 2010 with no evidence for disease recurrence-released from onc f/u 12/2014 (needs annual cxr by PCP).  CXR 08/2015 stable.  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 Earlie Server did bx and this was NEG for malignancy.  . Mild cognitive impairment with memory loss    Likely from brain radiation therapy  . Non-obstructive CAD (coronary artery disease)    a. Cath 2006 preserved LV fxn, scattered irregularities without critical stenosis; b. 2008 stress echo negative for ischemia, but with hypertensive response; c. 08/2016 Cath: D1 25%, otw nl.  . Normocytic anemia 2018   Iron, vit B12, folate all normal 12/2016.  Marland Kitchen Open-angle glaucoma 2016   Dr. Shirley Muscat, (bilateral)---responding to topical therapy  . Osteoporosis 03/2016; 03/2018   03/2016 DEXA T-score -2.2.  03/2018 T-score L femoral neck  -2.7--boniva started.  . Pneumonia    hospitalization 11/2016; hypoxemic resp failure--d/c'd home with home oxygen therapy  . Seasonal allergic rhinitis   . Subclinical hyperthyroidism 2018  . Takotsubo cardiomyopathy    a. 08/2016 Echo: EF 30-35%, gr1DD, PASP 37mHg;  b. 08/2016 Cath: nonobs Dzs;  c. 11/2016 Echo: EF 40-45%, no rwma, nl RV fxn, mild TR, PASP 363mg.  ECHO 02/2017 EF 50-55%, grd II DD---essentially resolved Takotsubo 02/2017.  . [place] [place]   a. 08/2016 in setting of diverticulitis & pneumoperitoneum and Takotsubo CM -->prolonged QT, seen by EP-->avoid meds with potential for QT prolongation.    Past Surgical History:  Procedure Laterality Date  . ABDOMINAL HYSTERECTOMY  1997  . APPENDECTOMY    . CARDIAC CATHETERIZATION N/A 09/14/2016   Procedure: Left Heart Cath and Coronary Angiography;  Surgeon: ThTroy SineMD;  [facility];  Service:  Cardiovascular; Mild nonobstructive CAD with 85% smooth narrowing in the first diagonal branch of the LAD; 10% smooth narrowing of the ostial proximal left circumflex coronary artery; and a normal dominant RCA.  . Marland KitchenARPAL TUNNEL RELEASE    . CATARACT EXTRACTION Bilateral   . CHOLECYSTECTOMY  2002  . COLONOSCOPY  10/24/00; 08/27/19   2002 normal.  [employer] hemoccult testing via [facility] 06/18/15 was NEG.  2020->diverticulosis o/w nl.  . dexa  03/2016; 03/2018   03/2018 T-score L femoral neck  -2.7 (worsened compared to osteopenic range 2017.)  . ESOPHAGOGASTRODUODENOSCOPY  08/27/2019   Normal.  . IR GENERIC HISTORICAL  09/19/2016   IR SINUS/FIST TUBE CHK-NON GI 09/19/2016 JaCorrie MckusickDO [facility]  . IR GENERIC HISTORICAL  10/06/2016   IR RADIOLOGIST EVAL & MGMT 10/06/2016 [facility]  . IR GENERIC HISTORICAL  10/26/2016   IR RADIOLOGIST EVAL & MGMT 10/26/2016 [facility]  . IR GENERIC HISTORICAL  11/03/2016   IR RADIOLOGIST EVAL & MGMT 11/03/2016 WeArdis RowanPA-C [facility]  .  IR GENERIC HISTORICAL  11/24/2016   IR RADIOLOGIST EVAL & MGMT 11/24/2016 Ardis Rowan, PA-C [facility]  . IR GENERIC HISTORICAL  12/05/2016   Fistula smaller/improving.  IR SINUS/FIST TUBE CHK-NON GI 12/05/2016 Sandi Mariscal, MD [facility]  . IR GENERIC HISTORICAL  12/22/2016   IR RADIOLOGIST EVAL & MGMT 12/22/2016 Greggory Keen, MD [facility]  . IR RADIOLOGIST EVAL & MGMT  01/10/2017  . IR RADIOLOGIST EVAL & MGMT  02/09/2017  . IR SINUS/FIST TUBE CHK-NON GI  03/28/2017  . LEFT HEART CATHETERIZATION WITH CORONARY ANGIOGRAM N/A 05/30/2012   Procedure: LEFT HEART CATHETERIZATION WITH CORONARY ANGIOGRAM;  Surgeon: Burnell Blanks, MD;  Location: [facility] C[facility];  Service: Cardiovascular: Mild, non-obstructive CAD  . TRANSTHORACIC ECHOCARDIOGRAM  09/14/2016; 11/2016   08/2016: EF 30-35 %, Akinesis of the mid-apicalanteroseptal myocardium.  Grade I DD.  Mild pulm HTN.  Repeat echo 11/2016, EF  40-45%, no rwma, nl RV fxn, mild TR, PASP 24mHg.    .Marland KitchenTRANSTHORACIC ECHOCARDIOGRAM  03/07/2017   EF 50-55%. Hypokinesis of the distal septum with overall low normal LV,  systolic function; mild diastolic dysfunction with elevated LV  filling pressure; mildly calcified aortic valve with mild AI; small pericardial effusion    Family History  Problem Relation Age of Onset  . Heart failure Mother   . Kidney disease Mother        renal failure  . Diabetes Mother   . Diabetes Father   . Diabetes Brother   . Heart disease Brother   . Heart attack Brother   . Heart attack Brother   . Heart attack Brother   . Pancreatic cancer Brother   . Colon cancer Neg Hx   . Esophageal cancer Neg Hx   . Stomach cancer Neg Hx   . Rectal cancer Neg Hx     Current Outpatient Medications:  .  aspirin 81 MG EC tablet, Take 81 mg by mouth daily. , Disp: , Rfl:  .  atorvastatin (LIPITOR) 40 MG tablet, Take 1 tablet (40 mg total) by mouth daily., Disp: 90 tablet, Rfl: 3 .  buPROPion (WELLBUTRIN XL) 150 MG 24 hr tablet, Take 1 tablet by mouth once daily (Patient taking differently: Take 150 mg by mouth daily. ), Disp: 30 tablet, Rfl: 0 .  cetirizine (ZYRTEC) 10 MG tablet, Take 10 mg by mouth daily., Disp: , Rfl:  .  escitalopram (LEXAPRO) 20 MG tablet, Take 1 tablet by mouth once daily (Patient taking differently: Take 20 mg by mouth daily. ), Disp: 90 tablet, Rfl: 1 .  ferrous sulfate 325 (65 FE) MG EC tablet, Take 325 mg by mouth 2 (two) times daily., Disp: , Rfl:  .  Insulin Isophane & Regular Human (HUMULIN 70/30 KWIKPEN) (70-30) 100 UNIT/ML PEN, Take 4 U SQ with breakfast and 2 U SQ with supper. (Patient taking differently: Inject 2-4 Units into the skin See admin instructions. Take 4 U SQ with breakfast and 2 U SQ with supper.), Disp: 15 pen, Rfl: 1 .  metFORMIN (GLUCOPHAGE) 500 MG tablet, TAKE 1 TABLET BY MOUTH TWICE DAILY WITH A MEAL (Patient taking differently: Take 500 mg by mouth 2 (two) times daily  with a meal. ), Disp: 180 tablet, Rfl: 0 .  metoprolol tartrate (LOPRESSOR) 25 MG tablet, TAKE 1 TABLET BY MOUTH IN THE MORNING AND 2 AT BEDTIME (Patient taking differently: Take 25-50 mg by mouth See admin instructions. Take one tablet in the morning, then take 2 tablets in  the evening per daughter), Disp: 270 tablet, Rfl: 1 .  Multiple Vitamins-Minerals (MULTIVITAMIN WOMEN PO), Take by mouth daily., Disp: , Rfl:  .  ONE TOUCH ULTRA TEST test strip, USE  STRIP TO CHECK GLUCOSE THREE TIMES DAILY, Disp: 100 each, Rfl: 11 .  ONETOUCH DELICA LANCETS 05L MISC, USE 1  TO CHECK GLUCOSE THREE TIMES DAILY, Disp: 100 each, Rfl: 11 .  pantoprazole (PROTONIX) 40 MG tablet, Take 1 tablet by mouth once daily (Patient taking differently: Take 40 mg by mouth daily. ), Disp: 90 tablet, Rfl: 0 .  potassium chloride (KLOR-CON) 10 MEQ tablet, Take 1 tablet by mouth once daily (Patient taking differently: Take 10 mEq by mouth daily. ), Disp: 90 tablet, Rfl: 0 .  albuterol (PROVENTIL HFA;VENTOLIN HFA) 108 (90 Base) MCG/ACT inhaler, INHALE 2 PUFFS BY MOUTH EVERY 6 HOURS AS NEEDED FOR WHEEZING OR SHORTNESS OF BREATH (Patient not taking: No sig reported), Disp: 18 g, Rfl: 0 .  albuterol (PROVENTIL) (2.5 MG/3ML) 0.083% nebulizer solution, USE 1 VIAL IN NEBULIZER EVERY 4 HOURS AS NEEDED FOR WHEEZING AND FOR SHORTNESS OF BREATH (Patient taking differently: Take 2.5 mg by nebulization every 4 (four) hours as needed for wheezing or shortness of breath. ), Disp: 75 mL, Rfl: 0 .  dexamethasone (DECADRON) 6 MG tablet, Take 1 tablet (6 mg total) by mouth daily. (Patient not taking: Reported on 11/07/2019), Disp: 3 tablet, Rfl: 0 .  FLOVENT HFA 220 MCG/ACT inhaler, Inhale 2 puffs by mouth twice daily (Patient not taking: No sig reported), Disp: 12 g, Rfl: 0 .  ibandronate (BONIVA) 150 MG tablet, Take 1 tablet (150 mg total) by mouth every 30 (thirty) days. Take in the morning with a full glass of water, on an empty stomach, and do not take  anything else by mouth or lie down for the next 30 min. (Patient not taking: Reported on 11/07/2019), Disp: 3 tablet, Rfl: 3  EXAM:  VITALS per patient if applicable: BP 976/73 (BP Location: Left Arm, Patient Position: Sitting, Cuff Size: Normal)   Pulse 94    GENERAL: alert, oriented, appears well and in no acute distress  HEENT: atraumatic, conjunttiva clear, no obvious abnormalities on inspection of external nose and ears  NECK: normal movements of the head and neck  LUNGS: on inspection no signs of respiratory distress, breathing rate appears normal, no obvious gross SOB, gasping or wheezing  CV: no obvious cyanosis  MS: moves all visible extremities without noticeable abnormality  PSYCH/NEURO: pleasant and cooperative, no obvious depression or anxiety, speech and thought processing grossly intact  LABS: none today Lab Results  Component Value Date   TSH 1.24 04/20/2017   Lab Results  Component Value Date   WBC 8.3 10/15/2019   HGB 10.7 (L) 10/15/2019   HCT 35.1 (L) 10/15/2019   MCV 88.4 10/15/2019   PLT 332 10/15/2019   Lab Results  Component Value Date   IRON 44 10/11/2019   TIBC 236 (L) 10/11/2019   FERRITIN 86 10/11/2019    Lab Results  Component Value Date   IRON 44 10/11/2019   TIBC 236 (L) 10/11/2019   FERRITIN 86 10/11/2019    Lab Results  Component Value Date   CREATININE 1.03 (H) 10/15/2019   BUN 28 (H) 10/15/2019   NA 133 (L) 10/15/2019   K 5.4 (H) 10/15/2019   CL 99 10/15/2019   CO2 25 10/15/2019   Lab Results  Component Value Date   ALT 18 10/15/2019   AST 16 10/15/2019  ALKPHOS 71 10/15/2019   BILITOT 0.4 10/15/2019   Lab Results  Component Value Date   CHOL 111 09/19/2018   Lab Results  Component Value Date   HDL 34 (L) 09/19/2018   Lab Results  Component Value Date   LDLCALC 48 09/19/2018   Lab Results  Component Value Date   TRIG 147 09/19/2018   Lab Results  Component Value Date   CHOLHDL 3.3 09/19/2018   Lab  Results  Component Value Date   HGBA1C 5.1 08/29/2019   ASSESSMENT AND PLAN:  Discussed the following assessment and plan:  1) Debilitated pt: is s/p SNF stay and is improved, needs to continue [facility] PT to try to help her maintain strength/function.  2) Covid 19 infection: resolved.  3) DM 2, control has been good lately. Stop metformin.  Continue 70/30 insulin 4 U qAM and 2 U qsupper.  4) Hyperkalemia: 5.4 on labs 3 wks ago.  Stop K, check BMET.  5) IDA, hem + 05/2019. EGD/colonoscopies unrevealing. Givens capsule study--the capsule could not be located. Tolerating qd iron. Last ferritin showed improvement, Hb stable around 10.5 the last 2 mo.   6) CRI III: sCr near baseline last check 3 wks ago. BMET future.  I discussed the assessment and treatment plan with the patient. The patient was provided an opportunity to ask questions and all were answered. The patient agreed with the plan and demonstrated an understanding of the instructions.   The patient was advised to call back or seek an in-person evaluation if the symptoms worsen or if the condition fails to improve as anticipated.  F/u: 2 mo  Signed:  Crissie Sickles, MD           11/07/2019

## 2019-11-09 DIAGNOSIS — E785 Hyperlipidemia, unspecified: Secondary | ICD-10-CM | POA: Diagnosis not present

## 2019-11-09 DIAGNOSIS — R269 Unspecified abnormalities of gait and mobility: Secondary | ICD-10-CM | POA: Diagnosis not present

## 2019-11-09 DIAGNOSIS — R296 Repeated falls: Secondary | ICD-10-CM | POA: Diagnosis not present

## 2019-11-09 DIAGNOSIS — I10 Essential (primary) hypertension: Secondary | ICD-10-CM | POA: Diagnosis not present

## 2019-11-09 DIAGNOSIS — U071 COVID-19: Secondary | ICD-10-CM | POA: Diagnosis not present

## 2019-11-09 DIAGNOSIS — I5032 Chronic diastolic (congestive) heart failure: Secondary | ICD-10-CM | POA: Diagnosis not present

## 2019-11-09 DIAGNOSIS — E1165 Type 2 diabetes mellitus with hyperglycemia: Secondary | ICD-10-CM | POA: Diagnosis not present

## 2019-11-09 DIAGNOSIS — J449 Chronic obstructive pulmonary disease, unspecified: Secondary | ICD-10-CM | POA: Diagnosis not present

## 2019-11-09 DIAGNOSIS — D631 Anemia in chronic kidney disease: Secondary | ICD-10-CM | POA: Diagnosis not present

## 2019-11-09 DIAGNOSIS — M6281 Muscle weakness (generalized): Secondary | ICD-10-CM | POA: Diagnosis not present

## 2019-11-12 DIAGNOSIS — M6281 Muscle weakness (generalized): Secondary | ICD-10-CM | POA: Diagnosis not present

## 2019-11-12 DIAGNOSIS — R296 Repeated falls: Secondary | ICD-10-CM | POA: Diagnosis not present

## 2019-11-12 DIAGNOSIS — E785 Hyperlipidemia, unspecified: Secondary | ICD-10-CM | POA: Diagnosis not present

## 2019-11-12 DIAGNOSIS — E1165 Type 2 diabetes mellitus with hyperglycemia: Secondary | ICD-10-CM | POA: Diagnosis not present

## 2019-11-12 DIAGNOSIS — I5032 Chronic diastolic (congestive) heart failure: Secondary | ICD-10-CM | POA: Diagnosis not present

## 2019-11-12 DIAGNOSIS — U071 COVID-19: Secondary | ICD-10-CM | POA: Diagnosis not present

## 2019-11-12 DIAGNOSIS — D631 Anemia in chronic kidney disease: Secondary | ICD-10-CM | POA: Diagnosis not present

## 2019-11-12 DIAGNOSIS — J449 Chronic obstructive pulmonary disease, unspecified: Secondary | ICD-10-CM | POA: Diagnosis not present

## 2019-11-12 DIAGNOSIS — I10 Essential (primary) hypertension: Secondary | ICD-10-CM | POA: Diagnosis not present

## 2019-11-12 DIAGNOSIS — R269 Unspecified abnormalities of gait and mobility: Secondary | ICD-10-CM | POA: Diagnosis not present

## 2019-11-13 DIAGNOSIS — R296 Repeated falls: Secondary | ICD-10-CM | POA: Diagnosis not present

## 2019-11-13 DIAGNOSIS — M6281 Muscle weakness (generalized): Secondary | ICD-10-CM | POA: Diagnosis not present

## 2019-11-13 DIAGNOSIS — J449 Chronic obstructive pulmonary disease, unspecified: Secondary | ICD-10-CM | POA: Diagnosis not present

## 2019-11-13 DIAGNOSIS — E785 Hyperlipidemia, unspecified: Secondary | ICD-10-CM | POA: Diagnosis not present

## 2019-11-13 DIAGNOSIS — I10 Essential (primary) hypertension: Secondary | ICD-10-CM | POA: Diagnosis not present

## 2019-11-13 DIAGNOSIS — D631 Anemia in chronic kidney disease: Secondary | ICD-10-CM | POA: Diagnosis not present

## 2019-11-13 DIAGNOSIS — E1165 Type 2 diabetes mellitus with hyperglycemia: Secondary | ICD-10-CM | POA: Diagnosis not present

## 2019-11-13 DIAGNOSIS — I5032 Chronic diastolic (congestive) heart failure: Secondary | ICD-10-CM | POA: Diagnosis not present

## 2019-11-13 DIAGNOSIS — R269 Unspecified abnormalities of gait and mobility: Secondary | ICD-10-CM | POA: Diagnosis not present

## 2019-11-13 DIAGNOSIS — U071 COVID-19: Secondary | ICD-10-CM | POA: Diagnosis not present

## 2019-11-14 DIAGNOSIS — R269 Unspecified abnormalities of gait and mobility: Secondary | ICD-10-CM | POA: Diagnosis not present

## 2019-11-14 DIAGNOSIS — D631 Anemia in chronic kidney disease: Secondary | ICD-10-CM | POA: Diagnosis not present

## 2019-11-14 DIAGNOSIS — I10 Essential (primary) hypertension: Secondary | ICD-10-CM | POA: Diagnosis not present

## 2019-11-14 DIAGNOSIS — J449 Chronic obstructive pulmonary disease, unspecified: Secondary | ICD-10-CM | POA: Diagnosis not present

## 2019-11-14 DIAGNOSIS — R296 Repeated falls: Secondary | ICD-10-CM | POA: Diagnosis not present

## 2019-11-14 DIAGNOSIS — E785 Hyperlipidemia, unspecified: Secondary | ICD-10-CM | POA: Diagnosis not present

## 2019-11-14 DIAGNOSIS — I5032 Chronic diastolic (congestive) heart failure: Secondary | ICD-10-CM | POA: Diagnosis not present

## 2019-11-14 DIAGNOSIS — E1165 Type 2 diabetes mellitus with hyperglycemia: Secondary | ICD-10-CM | POA: Diagnosis not present

## 2019-11-14 DIAGNOSIS — M6281 Muscle weakness (generalized): Secondary | ICD-10-CM | POA: Diagnosis not present

## 2019-11-14 DIAGNOSIS — U071 COVID-19: Secondary | ICD-10-CM | POA: Diagnosis not present

## 2019-11-15 DIAGNOSIS — R269 Unspecified abnormalities of gait and mobility: Secondary | ICD-10-CM | POA: Diagnosis not present

## 2019-11-15 DIAGNOSIS — J449 Chronic obstructive pulmonary disease, unspecified: Secondary | ICD-10-CM | POA: Diagnosis not present

## 2019-11-15 DIAGNOSIS — I5032 Chronic diastolic (congestive) heart failure: Secondary | ICD-10-CM | POA: Diagnosis not present

## 2019-11-15 DIAGNOSIS — D631 Anemia in chronic kidney disease: Secondary | ICD-10-CM | POA: Diagnosis not present

## 2019-11-15 DIAGNOSIS — M6281 Muscle weakness (generalized): Secondary | ICD-10-CM | POA: Diagnosis not present

## 2019-11-15 DIAGNOSIS — E785 Hyperlipidemia, unspecified: Secondary | ICD-10-CM | POA: Diagnosis not present

## 2019-11-15 DIAGNOSIS — U071 COVID-19: Secondary | ICD-10-CM | POA: Diagnosis not present

## 2019-11-15 DIAGNOSIS — R296 Repeated falls: Secondary | ICD-10-CM | POA: Diagnosis not present

## 2019-11-15 DIAGNOSIS — I10 Essential (primary) hypertension: Secondary | ICD-10-CM | POA: Diagnosis not present

## 2019-11-15 DIAGNOSIS — E1165 Type 2 diabetes mellitus with hyperglycemia: Secondary | ICD-10-CM | POA: Diagnosis not present

## 2019-11-20 DIAGNOSIS — J449 Chronic obstructive pulmonary disease, unspecified: Secondary | ICD-10-CM | POA: Diagnosis not present

## 2019-11-20 DIAGNOSIS — I10 Essential (primary) hypertension: Secondary | ICD-10-CM | POA: Diagnosis not present

## 2019-11-20 DIAGNOSIS — M6281 Muscle weakness (generalized): Secondary | ICD-10-CM | POA: Diagnosis not present

## 2019-11-20 DIAGNOSIS — D631 Anemia in chronic kidney disease: Secondary | ICD-10-CM | POA: Diagnosis not present

## 2019-11-20 DIAGNOSIS — E785 Hyperlipidemia, unspecified: Secondary | ICD-10-CM | POA: Diagnosis not present

## 2019-11-20 DIAGNOSIS — I5032 Chronic diastolic (congestive) heart failure: Secondary | ICD-10-CM | POA: Diagnosis not present

## 2019-11-20 DIAGNOSIS — R296 Repeated falls: Secondary | ICD-10-CM | POA: Diagnosis not present

## 2019-11-20 DIAGNOSIS — U071 COVID-19: Secondary | ICD-10-CM | POA: Diagnosis not present

## 2019-11-20 DIAGNOSIS — R269 Unspecified abnormalities of gait and mobility: Secondary | ICD-10-CM | POA: Diagnosis not present

## 2019-11-20 DIAGNOSIS — E1165 Type 2 diabetes mellitus with hyperglycemia: Secondary | ICD-10-CM | POA: Diagnosis not present

## 2019-11-22 DIAGNOSIS — E785 Hyperlipidemia, unspecified: Secondary | ICD-10-CM | POA: Diagnosis not present

## 2019-11-22 DIAGNOSIS — E1165 Type 2 diabetes mellitus with hyperglycemia: Secondary | ICD-10-CM | POA: Diagnosis not present

## 2019-11-22 DIAGNOSIS — I5032 Chronic diastolic (congestive) heart failure: Secondary | ICD-10-CM | POA: Diagnosis not present

## 2019-11-22 DIAGNOSIS — R269 Unspecified abnormalities of gait and mobility: Secondary | ICD-10-CM | POA: Diagnosis not present

## 2019-11-22 DIAGNOSIS — J449 Chronic obstructive pulmonary disease, unspecified: Secondary | ICD-10-CM | POA: Diagnosis not present

## 2019-11-22 DIAGNOSIS — U071 COVID-19: Secondary | ICD-10-CM | POA: Diagnosis not present

## 2019-11-22 DIAGNOSIS — M6281 Muscle weakness (generalized): Secondary | ICD-10-CM | POA: Diagnosis not present

## 2019-11-22 DIAGNOSIS — D631 Anemia in chronic kidney disease: Secondary | ICD-10-CM | POA: Diagnosis not present

## 2019-11-22 DIAGNOSIS — R296 Repeated falls: Secondary | ICD-10-CM | POA: Diagnosis not present

## 2019-11-22 DIAGNOSIS — I10 Essential (primary) hypertension: Secondary | ICD-10-CM | POA: Diagnosis not present

## 2019-11-25 DIAGNOSIS — U071 COVID-19: Secondary | ICD-10-CM | POA: Diagnosis not present

## 2019-11-25 DIAGNOSIS — E1165 Type 2 diabetes mellitus with hyperglycemia: Secondary | ICD-10-CM | POA: Diagnosis not present

## 2019-11-25 DIAGNOSIS — I10 Essential (primary) hypertension: Secondary | ICD-10-CM | POA: Diagnosis not present

## 2019-11-25 DIAGNOSIS — J449 Chronic obstructive pulmonary disease, unspecified: Secondary | ICD-10-CM | POA: Diagnosis not present

## 2019-11-25 DIAGNOSIS — D631 Anemia in chronic kidney disease: Secondary | ICD-10-CM | POA: Diagnosis not present

## 2019-11-25 DIAGNOSIS — M6281 Muscle weakness (generalized): Secondary | ICD-10-CM | POA: Diagnosis not present

## 2019-11-25 DIAGNOSIS — R269 Unspecified abnormalities of gait and mobility: Secondary | ICD-10-CM | POA: Diagnosis not present

## 2019-11-25 DIAGNOSIS — R296 Repeated falls: Secondary | ICD-10-CM | POA: Diagnosis not present

## 2019-11-25 DIAGNOSIS — E785 Hyperlipidemia, unspecified: Secondary | ICD-10-CM | POA: Diagnosis not present

## 2019-11-25 DIAGNOSIS — I5032 Chronic diastolic (congestive) heart failure: Secondary | ICD-10-CM | POA: Diagnosis not present

## 2019-11-27 DIAGNOSIS — I10 Essential (primary) hypertension: Secondary | ICD-10-CM | POA: Diagnosis not present

## 2019-11-27 DIAGNOSIS — R269 Unspecified abnormalities of gait and mobility: Secondary | ICD-10-CM | POA: Diagnosis not present

## 2019-11-27 DIAGNOSIS — M6281 Muscle weakness (generalized): Secondary | ICD-10-CM | POA: Diagnosis not present

## 2019-11-27 DIAGNOSIS — I5032 Chronic diastolic (congestive) heart failure: Secondary | ICD-10-CM | POA: Diagnosis not present

## 2019-11-27 DIAGNOSIS — U071 COVID-19: Secondary | ICD-10-CM | POA: Diagnosis not present

## 2019-11-27 DIAGNOSIS — R296 Repeated falls: Secondary | ICD-10-CM | POA: Diagnosis not present

## 2019-11-27 DIAGNOSIS — D631 Anemia in chronic kidney disease: Secondary | ICD-10-CM | POA: Diagnosis not present

## 2019-11-27 DIAGNOSIS — E1165 Type 2 diabetes mellitus with hyperglycemia: Secondary | ICD-10-CM | POA: Diagnosis not present

## 2019-11-27 DIAGNOSIS — J449 Chronic obstructive pulmonary disease, unspecified: Secondary | ICD-10-CM | POA: Diagnosis not present

## 2019-11-27 DIAGNOSIS — E785 Hyperlipidemia, unspecified: Secondary | ICD-10-CM | POA: Diagnosis not present

## 2019-12-02 ENCOUNTER — Other Ambulatory Visit: Payer: Self-pay | Admitting: Cardiology

## 2019-12-02 ENCOUNTER — Other Ambulatory Visit: Payer: Self-pay | Admitting: Family Medicine

## 2019-12-02 DIAGNOSIS — U071 COVID-19: Secondary | ICD-10-CM | POA: Diagnosis not present

## 2019-12-02 DIAGNOSIS — E1165 Type 2 diabetes mellitus with hyperglycemia: Secondary | ICD-10-CM | POA: Diagnosis not present

## 2019-12-02 DIAGNOSIS — M6281 Muscle weakness (generalized): Secondary | ICD-10-CM | POA: Diagnosis not present

## 2019-12-02 DIAGNOSIS — I5032 Chronic diastolic (congestive) heart failure: Secondary | ICD-10-CM | POA: Diagnosis not present

## 2019-12-02 DIAGNOSIS — E785 Hyperlipidemia, unspecified: Secondary | ICD-10-CM | POA: Diagnosis not present

## 2019-12-02 DIAGNOSIS — R296 Repeated falls: Secondary | ICD-10-CM | POA: Diagnosis not present

## 2019-12-02 DIAGNOSIS — I10 Essential (primary) hypertension: Secondary | ICD-10-CM | POA: Diagnosis not present

## 2019-12-02 DIAGNOSIS — R269 Unspecified abnormalities of gait and mobility: Secondary | ICD-10-CM | POA: Diagnosis not present

## 2019-12-02 DIAGNOSIS — J449 Chronic obstructive pulmonary disease, unspecified: Secondary | ICD-10-CM | POA: Diagnosis not present

## 2019-12-02 DIAGNOSIS — D631 Anemia in chronic kidney disease: Secondary | ICD-10-CM | POA: Diagnosis not present

## 2019-12-03 NOTE — Telephone Encounter (Signed)
Rx has been sent to the pharmacy electronically. ° °

## 2019-12-04 DIAGNOSIS — R296 Repeated falls: Secondary | ICD-10-CM | POA: Diagnosis not present

## 2019-12-04 DIAGNOSIS — D631 Anemia in chronic kidney disease: Secondary | ICD-10-CM | POA: Diagnosis not present

## 2019-12-04 DIAGNOSIS — E1165 Type 2 diabetes mellitus with hyperglycemia: Secondary | ICD-10-CM | POA: Diagnosis not present

## 2019-12-04 DIAGNOSIS — M6281 Muscle weakness (generalized): Secondary | ICD-10-CM | POA: Diagnosis not present

## 2019-12-04 DIAGNOSIS — U071 COVID-19: Secondary | ICD-10-CM | POA: Diagnosis not present

## 2019-12-04 DIAGNOSIS — E785 Hyperlipidemia, unspecified: Secondary | ICD-10-CM | POA: Diagnosis not present

## 2019-12-04 DIAGNOSIS — R269 Unspecified abnormalities of gait and mobility: Secondary | ICD-10-CM | POA: Diagnosis not present

## 2019-12-04 DIAGNOSIS — I5032 Chronic diastolic (congestive) heart failure: Secondary | ICD-10-CM | POA: Diagnosis not present

## 2019-12-04 DIAGNOSIS — J449 Chronic obstructive pulmonary disease, unspecified: Secondary | ICD-10-CM | POA: Diagnosis not present

## 2019-12-04 DIAGNOSIS — I10 Essential (primary) hypertension: Secondary | ICD-10-CM | POA: Diagnosis not present

## 2019-12-09 DIAGNOSIS — I10 Essential (primary) hypertension: Secondary | ICD-10-CM | POA: Diagnosis not present

## 2019-12-09 DIAGNOSIS — I5032 Chronic diastolic (congestive) heart failure: Secondary | ICD-10-CM | POA: Diagnosis not present

## 2019-12-09 DIAGNOSIS — R296 Repeated falls: Secondary | ICD-10-CM | POA: Diagnosis not present

## 2019-12-09 DIAGNOSIS — J449 Chronic obstructive pulmonary disease, unspecified: Secondary | ICD-10-CM | POA: Diagnosis not present

## 2019-12-09 DIAGNOSIS — E1165 Type 2 diabetes mellitus with hyperglycemia: Secondary | ICD-10-CM | POA: Diagnosis not present

## 2019-12-09 DIAGNOSIS — D631 Anemia in chronic kidney disease: Secondary | ICD-10-CM | POA: Diagnosis not present

## 2019-12-09 DIAGNOSIS — U071 COVID-19: Secondary | ICD-10-CM | POA: Diagnosis not present

## 2019-12-09 DIAGNOSIS — R269 Unspecified abnormalities of gait and mobility: Secondary | ICD-10-CM | POA: Diagnosis not present

## 2019-12-09 DIAGNOSIS — M6281 Muscle weakness (generalized): Secondary | ICD-10-CM | POA: Diagnosis not present

## 2019-12-09 DIAGNOSIS — E785 Hyperlipidemia, unspecified: Secondary | ICD-10-CM | POA: Diagnosis not present

## 2019-12-11 DIAGNOSIS — U071 COVID-19: Secondary | ICD-10-CM | POA: Diagnosis not present

## 2019-12-11 DIAGNOSIS — M6281 Muscle weakness (generalized): Secondary | ICD-10-CM | POA: Diagnosis not present

## 2019-12-11 DIAGNOSIS — E1165 Type 2 diabetes mellitus with hyperglycemia: Secondary | ICD-10-CM | POA: Diagnosis not present

## 2019-12-11 DIAGNOSIS — I5032 Chronic diastolic (congestive) heart failure: Secondary | ICD-10-CM | POA: Diagnosis not present

## 2019-12-11 DIAGNOSIS — D631 Anemia in chronic kidney disease: Secondary | ICD-10-CM | POA: Diagnosis not present

## 2019-12-11 DIAGNOSIS — E785 Hyperlipidemia, unspecified: Secondary | ICD-10-CM | POA: Diagnosis not present

## 2019-12-11 DIAGNOSIS — R269 Unspecified abnormalities of gait and mobility: Secondary | ICD-10-CM | POA: Diagnosis not present

## 2019-12-11 DIAGNOSIS — I10 Essential (primary) hypertension: Secondary | ICD-10-CM | POA: Diagnosis not present

## 2019-12-11 DIAGNOSIS — J449 Chronic obstructive pulmonary disease, unspecified: Secondary | ICD-10-CM | POA: Diagnosis not present

## 2019-12-11 DIAGNOSIS — R296 Repeated falls: Secondary | ICD-10-CM | POA: Diagnosis not present

## 2019-12-13 ENCOUNTER — Telehealth: Payer: Self-pay

## 2019-12-13 DIAGNOSIS — R269 Unspecified abnormalities of gait and mobility: Secondary | ICD-10-CM | POA: Diagnosis not present

## 2019-12-13 DIAGNOSIS — M6281 Muscle weakness (generalized): Secondary | ICD-10-CM | POA: Diagnosis not present

## 2019-12-13 DIAGNOSIS — R296 Repeated falls: Secondary | ICD-10-CM

## 2019-12-13 DIAGNOSIS — I10 Essential (primary) hypertension: Secondary | ICD-10-CM

## 2019-12-13 DIAGNOSIS — F3289 Other specified depressive episodes: Secondary | ICD-10-CM

## 2019-12-13 DIAGNOSIS — E785 Hyperlipidemia, unspecified: Secondary | ICD-10-CM

## 2019-12-13 DIAGNOSIS — U071 COVID-19: Secondary | ICD-10-CM | POA: Diagnosis not present

## 2019-12-13 DIAGNOSIS — I5032 Chronic diastolic (congestive) heart failure: Secondary | ICD-10-CM

## 2019-12-13 DIAGNOSIS — D631 Anemia in chronic kidney disease: Secondary | ICD-10-CM | POA: Diagnosis not present

## 2019-12-13 DIAGNOSIS — J449 Chronic obstructive pulmonary disease, unspecified: Secondary | ICD-10-CM | POA: Diagnosis not present

## 2019-12-13 DIAGNOSIS — E1165 Type 2 diabetes mellitus with hyperglycemia: Secondary | ICD-10-CM

## 2019-12-13 NOTE — Telephone Encounter (Signed)
Placed on PCP desk to review and sign, if appropriate.  

## 2019-12-16 DIAGNOSIS — U071 COVID-19: Secondary | ICD-10-CM | POA: Diagnosis not present

## 2019-12-16 DIAGNOSIS — M6281 Muscle weakness (generalized): Secondary | ICD-10-CM | POA: Diagnosis not present

## 2019-12-16 DIAGNOSIS — D631 Anemia in chronic kidney disease: Secondary | ICD-10-CM | POA: Diagnosis not present

## 2019-12-16 DIAGNOSIS — R296 Repeated falls: Secondary | ICD-10-CM | POA: Diagnosis not present

## 2019-12-16 DIAGNOSIS — R269 Unspecified abnormalities of gait and mobility: Secondary | ICD-10-CM | POA: Diagnosis not present

## 2019-12-16 DIAGNOSIS — J449 Chronic obstructive pulmonary disease, unspecified: Secondary | ICD-10-CM | POA: Diagnosis not present

## 2019-12-16 DIAGNOSIS — I10 Essential (primary) hypertension: Secondary | ICD-10-CM | POA: Diagnosis not present

## 2019-12-16 DIAGNOSIS — I5032 Chronic diastolic (congestive) heart failure: Secondary | ICD-10-CM | POA: Diagnosis not present

## 2019-12-16 DIAGNOSIS — E785 Hyperlipidemia, unspecified: Secondary | ICD-10-CM | POA: Diagnosis not present

## 2019-12-16 DIAGNOSIS — E1165 Type 2 diabetes mellitus with hyperglycemia: Secondary | ICD-10-CM | POA: Diagnosis not present

## 2019-12-17 ENCOUNTER — Other Ambulatory Visit: Payer: Self-pay | Admitting: Family Medicine

## 2019-12-17 ENCOUNTER — Other Ambulatory Visit: Payer: Self-pay

## 2019-12-17 DIAGNOSIS — E118 Type 2 diabetes mellitus with unspecified complications: Secondary | ICD-10-CM

## 2019-12-17 DIAGNOSIS — Z794 Long term (current) use of insulin: Secondary | ICD-10-CM

## 2019-12-17 MED ORDER — HUMULIN 70/30 KWIKPEN (70-30) 100 UNIT/ML ~~LOC~~ SUPN: PEN_INJECTOR | SUBCUTANEOUS | 1 refills | Status: DC

## 2019-12-17 NOTE — Telephone Encounter (Addendum)
RF request for Insulin Isophane & Humulin 70/30 LOV:11/07/19 Next ov: advised to f/u 2 mo Last written:09/02/19(15 pen,1)  RF sent. Pt notified.

## 2019-12-17 NOTE — Telephone Encounter (Signed)
Pt is totally out of Insulin Isophane & Regular Human (HUMULIN 70/30 KWIKPEN) (70-30) 100 UNIT/ML PEN. Will you please send a prescription [facility]? She called the pharmacy and they told her she needed to contact us for a new prescription.

## 2019-12-18 DIAGNOSIS — U071 COVID-19: Secondary | ICD-10-CM | POA: Diagnosis not present

## 2019-12-18 DIAGNOSIS — M6281 Muscle weakness (generalized): Secondary | ICD-10-CM | POA: Diagnosis not present

## 2019-12-18 DIAGNOSIS — D631 Anemia in chronic kidney disease: Secondary | ICD-10-CM | POA: Diagnosis not present

## 2019-12-18 DIAGNOSIS — E1165 Type 2 diabetes mellitus with hyperglycemia: Secondary | ICD-10-CM | POA: Diagnosis not present

## 2019-12-18 DIAGNOSIS — I5032 Chronic diastolic (congestive) heart failure: Secondary | ICD-10-CM | POA: Diagnosis not present

## 2019-12-18 DIAGNOSIS — R296 Repeated falls: Secondary | ICD-10-CM | POA: Diagnosis not present

## 2019-12-18 DIAGNOSIS — I10 Essential (primary) hypertension: Secondary | ICD-10-CM | POA: Diagnosis not present

## 2019-12-18 DIAGNOSIS — J449 Chronic obstructive pulmonary disease, unspecified: Secondary | ICD-10-CM | POA: Diagnosis not present

## 2019-12-18 DIAGNOSIS — R269 Unspecified abnormalities of gait and mobility: Secondary | ICD-10-CM | POA: Diagnosis not present

## 2019-12-18 DIAGNOSIS — E785 Hyperlipidemia, unspecified: Secondary | ICD-10-CM | POA: Diagnosis not present

## 2019-12-18 MED ORDER — HUMULIN 70/30 KWIKPEN (70-30) 100 UNIT/ML ~~LOC~~ SUPN: PEN_INJECTOR | SUBCUTANEOUS | 1 refills | Status: DC

## 2019-12-18 NOTE — Telephone Encounter (Signed)
OK, insulin eRx'd as requested.

## 2019-12-18 NOTE — Addendum Note (Signed)
Addended by: [name] on: 12/18/2019 12:12 PM   Modules accepted: Orders

## 2019-12-23 DIAGNOSIS — E785 Hyperlipidemia, unspecified: Secondary | ICD-10-CM | POA: Diagnosis not present

## 2019-12-23 DIAGNOSIS — E1165 Type 2 diabetes mellitus with hyperglycemia: Secondary | ICD-10-CM | POA: Diagnosis not present

## 2019-12-23 DIAGNOSIS — R269 Unspecified abnormalities of gait and mobility: Secondary | ICD-10-CM | POA: Diagnosis not present

## 2019-12-23 DIAGNOSIS — U071 COVID-19: Secondary | ICD-10-CM | POA: Diagnosis not present

## 2019-12-23 DIAGNOSIS — J449 Chronic obstructive pulmonary disease, unspecified: Secondary | ICD-10-CM | POA: Diagnosis not present

## 2019-12-23 DIAGNOSIS — R296 Repeated falls: Secondary | ICD-10-CM | POA: Diagnosis not present

## 2019-12-23 DIAGNOSIS — D631 Anemia in chronic kidney disease: Secondary | ICD-10-CM | POA: Diagnosis not present

## 2019-12-23 DIAGNOSIS — M6281 Muscle weakness (generalized): Secondary | ICD-10-CM | POA: Diagnosis not present

## 2019-12-23 DIAGNOSIS — I5032 Chronic diastolic (congestive) heart failure: Secondary | ICD-10-CM | POA: Diagnosis not present

## 2019-12-23 DIAGNOSIS — I10 Essential (primary) hypertension: Secondary | ICD-10-CM | POA: Diagnosis not present

## 2019-12-25 DIAGNOSIS — M6281 Muscle weakness (generalized): Secondary | ICD-10-CM | POA: Diagnosis not present

## 2019-12-25 DIAGNOSIS — R296 Repeated falls: Secondary | ICD-10-CM | POA: Diagnosis not present

## 2019-12-25 DIAGNOSIS — E785 Hyperlipidemia, unspecified: Secondary | ICD-10-CM | POA: Diagnosis not present

## 2019-12-25 DIAGNOSIS — E1165 Type 2 diabetes mellitus with hyperglycemia: Secondary | ICD-10-CM | POA: Diagnosis not present

## 2019-12-25 DIAGNOSIS — U071 COVID-19: Secondary | ICD-10-CM | POA: Diagnosis not present

## 2019-12-25 DIAGNOSIS — D631 Anemia in chronic kidney disease: Secondary | ICD-10-CM | POA: Diagnosis not present

## 2019-12-25 DIAGNOSIS — I5032 Chronic diastolic (congestive) heart failure: Secondary | ICD-10-CM | POA: Diagnosis not present

## 2019-12-25 DIAGNOSIS — R269 Unspecified abnormalities of gait and mobility: Secondary | ICD-10-CM | POA: Diagnosis not present

## 2019-12-25 DIAGNOSIS — J449 Chronic obstructive pulmonary disease, unspecified: Secondary | ICD-10-CM | POA: Diagnosis not present

## 2019-12-25 DIAGNOSIS — I10 Essential (primary) hypertension: Secondary | ICD-10-CM | POA: Diagnosis not present

## 2019-12-26 ENCOUNTER — Ambulatory Visit: Payer: Medicare Other | Admitting: *Deleted

## 2019-12-31 DIAGNOSIS — I5032 Chronic diastolic (congestive) heart failure: Secondary | ICD-10-CM | POA: Diagnosis not present

## 2019-12-31 DIAGNOSIS — R269 Unspecified abnormalities of gait and mobility: Secondary | ICD-10-CM | POA: Diagnosis not present

## 2019-12-31 DIAGNOSIS — J449 Chronic obstructive pulmonary disease, unspecified: Secondary | ICD-10-CM | POA: Diagnosis not present

## 2019-12-31 DIAGNOSIS — E785 Hyperlipidemia, unspecified: Secondary | ICD-10-CM | POA: Diagnosis not present

## 2019-12-31 DIAGNOSIS — M6281 Muscle weakness (generalized): Secondary | ICD-10-CM | POA: Diagnosis not present

## 2019-12-31 DIAGNOSIS — I10 Essential (primary) hypertension: Secondary | ICD-10-CM | POA: Diagnosis not present

## 2019-12-31 DIAGNOSIS — E1165 Type 2 diabetes mellitus with hyperglycemia: Secondary | ICD-10-CM | POA: Diagnosis not present

## 2019-12-31 DIAGNOSIS — U071 COVID-19: Secondary | ICD-10-CM | POA: Diagnosis not present

## 2019-12-31 DIAGNOSIS — D631 Anemia in chronic kidney disease: Secondary | ICD-10-CM | POA: Diagnosis not present

## 2019-12-31 DIAGNOSIS — R296 Repeated falls: Secondary | ICD-10-CM | POA: Diagnosis not present

## 2020-01-02 DIAGNOSIS — R269 Unspecified abnormalities of gait and mobility: Secondary | ICD-10-CM | POA: Diagnosis not present

## 2020-01-02 DIAGNOSIS — D631 Anemia in chronic kidney disease: Secondary | ICD-10-CM | POA: Diagnosis not present

## 2020-01-02 DIAGNOSIS — J449 Chronic obstructive pulmonary disease, unspecified: Secondary | ICD-10-CM | POA: Diagnosis not present

## 2020-01-02 DIAGNOSIS — R296 Repeated falls: Secondary | ICD-10-CM | POA: Diagnosis not present

## 2020-01-02 DIAGNOSIS — M6281 Muscle weakness (generalized): Secondary | ICD-10-CM | POA: Diagnosis not present

## 2020-01-02 DIAGNOSIS — U071 COVID-19: Secondary | ICD-10-CM | POA: Diagnosis not present

## 2020-01-02 DIAGNOSIS — E785 Hyperlipidemia, unspecified: Secondary | ICD-10-CM | POA: Diagnosis not present

## 2020-01-02 DIAGNOSIS — E1165 Type 2 diabetes mellitus with hyperglycemia: Secondary | ICD-10-CM | POA: Diagnosis not present

## 2020-01-02 DIAGNOSIS — I5032 Chronic diastolic (congestive) heart failure: Secondary | ICD-10-CM | POA: Diagnosis not present

## 2020-01-02 DIAGNOSIS — I10 Essential (primary) hypertension: Secondary | ICD-10-CM | POA: Diagnosis not present

## 2020-01-05 ENCOUNTER — Other Ambulatory Visit: Payer: Self-pay | Admitting: Family Medicine

## 2020-01-08 ENCOUNTER — Other Ambulatory Visit: Payer: Self-pay | Admitting: Family Medicine

## 2020-01-15 ENCOUNTER — Telehealth: Payer: Self-pay

## 2020-01-15 NOTE — Telephone Encounter (Signed)
Received form for pt's revision to plan of care, Placed on PCP desk to review and sign, if appropriate.

## 2020-01-15 NOTE — Telephone Encounter (Signed)
Signed and put in box to go up front. Signed:  Crissie Sickles, MD           01/15/2020

## 2020-01-17 ENCOUNTER — Telehealth: Payer: Self-pay

## 2020-01-17 NOTE — Telephone Encounter (Signed)
-----   Message from Alfredia Ferguson, PA-C sent at 01/17/2020 12:07 PM EDT ----- No video ever created - capsoview says "contact Pt "-- guessing she did not retrieve and return ----- Message ----- From: Marlon Pel, RN Sent: 01/17/2020   9:03 AM EDT To: Glade Nurse [place], PA-C  Good morning,  There is an open capsule encounter on this patient under Ardis Hughs since December 1.  Will you look at it and help determine who read it and can we place the charges and close the encounter.?   Emmely Bittinger

## 2020-01-17 NOTE — Progress Notes (Signed)
Patient did not retrieve the capsule or return

## 2020-01-17 NOTE — Telephone Encounter (Signed)
Capsule was not returned by the patient.

## 2020-01-19 ENCOUNTER — Other Ambulatory Visit: Payer: Self-pay | Admitting: Cardiology

## 2020-01-21 ENCOUNTER — Other Ambulatory Visit: Payer: Self-pay | Admitting: Cardiology

## 2020-01-25 ENCOUNTER — Other Ambulatory Visit: Payer: Self-pay | Admitting: Family Medicine

## 2020-01-27 ENCOUNTER — Other Ambulatory Visit: Payer: Self-pay | Admitting: Family Medicine

## 2020-02-03 ENCOUNTER — Encounter (HOSPITAL_COMMUNITY): Payer: Self-pay | Admitting: Emergency Medicine

## 2020-02-03 ENCOUNTER — Emergency Department (HOSPITAL_COMMUNITY)
Admission: EM | Admit: 2020-02-03 | Discharge: 2020-02-04 | Disposition: A | Discharge status: Home / Self Care | Payer: Medicare Other | Attending: Emergency Medicine | Admitting: Emergency Medicine

## 2020-02-03 ENCOUNTER — Other Ambulatory Visit: Payer: Self-pay

## 2020-02-03 ENCOUNTER — Emergency Department (HOSPITAL_COMMUNITY): Payer: Medicare Other

## 2020-02-03 DIAGNOSIS — E1122 Type 2 diabetes mellitus with diabetic chronic kidney disease: Secondary | ICD-10-CM | POA: Insufficient documentation

## 2020-02-03 DIAGNOSIS — Z7982 Long term (current) use of aspirin: Secondary | ICD-10-CM | POA: Diagnosis not present

## 2020-02-03 DIAGNOSIS — S7002XA Contusion of left hip, initial encounter: Secondary | ICD-10-CM | POA: Insufficient documentation

## 2020-02-03 DIAGNOSIS — Y929 Unspecified place or not applicable: Secondary | ICD-10-CM | POA: Diagnosis not present

## 2020-02-03 DIAGNOSIS — N183 Chronic kidney disease, stage 3 unspecified: Secondary | ICD-10-CM | POA: Diagnosis not present

## 2020-02-03 DIAGNOSIS — W19XXXA Unspecified fall, initial encounter: Secondary | ICD-10-CM | POA: Diagnosis not present

## 2020-02-03 DIAGNOSIS — Y939 Activity, unspecified: Secondary | ICD-10-CM | POA: Insufficient documentation

## 2020-02-03 DIAGNOSIS — Z79899 Other long term (current) drug therapy: Secondary | ICD-10-CM | POA: Diagnosis not present

## 2020-02-03 DIAGNOSIS — S79912A Unspecified injury of left hip, initial encounter: Secondary | ICD-10-CM | POA: Diagnosis not present

## 2020-02-03 DIAGNOSIS — I129 Hypertensive chronic kidney disease with stage 1 through stage 4 chronic kidney disease, or unspecified chronic kidney disease: Secondary | ICD-10-CM | POA: Insufficient documentation

## 2020-02-03 DIAGNOSIS — Y999 Unspecified external cause status: Secondary | ICD-10-CM | POA: Diagnosis not present

## 2020-02-03 DIAGNOSIS — Z794 Long term (current) use of insulin: Secondary | ICD-10-CM | POA: Insufficient documentation

## 2020-02-03 DIAGNOSIS — J449 Chronic obstructive pulmonary disease, unspecified: Secondary | ICD-10-CM | POA: Diagnosis not present

## 2020-02-03 LAB — CBC
HCT: 38.1 % (ref 36.0–46.0)
Hemoglobin: 11.7 g/dL — ABNORMAL LOW (ref 12.0–15.0)
MCH: 28.5 pg (ref 26.0–34.0)
MCHC: 30.7 g/dL (ref 30.0–36.0)
MCV: 92.7 fL (ref 80.0–100.0)
Platelets: 256 10*3/uL (ref 150–400)
RBC: 4.11 MIL/uL (ref 3.87–5.11)
RDW: 15.7 % — ABNORMAL HIGH (ref 11.5–15.5)
WBC: 8.6 10*3/uL (ref 4.0–10.5)
nRBC: 0 % (ref 0.0–0.2)

## 2020-02-03 LAB — COMPREHENSIVE METABOLIC PANEL
ALT: 14 U/L (ref 0–44)
AST: 20 U/L (ref 15–41)
Albumin: 3.8 g/dL (ref 3.5–5.0)
Alkaline Phosphatase: 70 U/L (ref 38–126)
Anion gap: 9 (ref 5–15)
BUN: 16 mg/dL (ref 8–23)
CO2: 25 mmol/L (ref 22–32)
Calcium: 8.2 mg/dL — ABNORMAL LOW (ref 8.9–10.3)
Chloride: 100 mmol/L (ref 98–111)
Creatinine, Ser: 0.89 mg/dL (ref 0.44–1.00)
GFR calc Af Amer: >60 mL/min (ref >60)
GFR calc non Af Amer: >60 mL/min (ref >60)
Glucose, Bld: 194 mg/dL — ABNORMAL HIGH (ref 70–99)
Potassium: 4 mmol/L (ref 3.5–5.1)
Sodium: 134 mmol/L — ABNORMAL LOW (ref 135–145)
Total Bilirubin: 0.6 mg/dL (ref 0.3–1.2)
Total Protein: 7.2 g/dL (ref 6.5–8.1)

## 2020-02-03 NOTE — ED Triage Notes (Signed)
Pt states that she falls a lot and feel a couple days ago and has swollen bruise area on left hip.

## 2020-02-03 NOTE — ED Notes (Signed)
Pt refusing to change into a gown.

## 2020-02-04 NOTE — Discharge Instructions (Addendum)
Follow up with your doctor as needed. Return to the emergency any time for emergent medical concerns.

## 2020-02-04 NOTE — ED Provider Notes (Signed)
[facility] DEPT Provider Note   CSN: [id number] Arrival date & time: 02/03/20  1643     History Chief Complaint  Patient presents with  . Fall  . Hip Pain    [name] is a 74 y.o. female.  Patient to ED with son on the advice of PCP for evaluation of left hip bruising and pain after fall 2 days ago. She reports she falls often, walks with a walker. She has been ambulatory since fall. She denies hitting her head, denies chest pain, abdominal pain, neck or back pain. No recent fever. Son is at bedside who expresses no other concern that possibility of hip fracture.   The history is provided by the patient and a relative. No language interpreter was used.  Fall  Hip Pain       Past Medical History:  Diagnosis Date  . Age-related nuclear cataract of both eyes 2016   +cortical age related cataracts OU   . Anxiety   . Arthritis    hands  . Bilateral diabetic retinopathy (HCC) 2015   Dr. Zigmund Daniel  . Blood transfusion without reported diagnosis    when taking chemo  . Chronic renal insufficiency, stage 3 (moderate)    GFR 50s  . 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  Marland Kitchen COPD (chronic obstructive pulmonary disease) ([place])   . CVA (cerebral vascular accident) ([place]) 09/2016   MRI did show a left-sided small ischemic stroke which was acute but likely incidental (MRI was done b/c pt had TIA sx's in hospital).  Neuro put her on plavix at that time.  Cardiology d/c'd her plavix 08/2017.  . Depression   . Diabetes mellitus with complication (HCC)    diab retinopathy OU (laser)  . GERD (gastroesophageal reflux disease)    protonix  . History of concussion 08/25/2018   w/out loss of consciousness--mild increase in baseline memory impairment after.  CT head neg acute.  Marland Kitchen History of diverticulitis of colon    with abscess; required IR percutaneous drain placed 09/2016.  Marland Kitchen Hyperlipidemia   .  Hypertension   . Iron deficiency anemia    started iron 07/02/19.  EGD/colonoscopy 08/27/19 unrevealing. Capsule endoscopy to be done.  . Left bundle branch block (LBBB) 12/16/2016  . Lung cancer ([place])    non-small cell lung ca, stage III in 05/2011; systemic chemotherapy concurrent with radiation followed by prophylactic cranial irradiation and has been observation since July of 2010 with no evidence for disease recurrence-released from onc f/u 12/2014 (needs annual cxr by PCP).  CXR 08/2015 stable.  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 Earlie Server did bx and this was NEG for malignancy.  . Mild cognitive impairment with memory loss    Likely from brain radiation therapy  . Non-obstructive CAD (coronary artery disease)    a. Cath 2006 preserved LV fxn, scattered irregularities without critical stenosis; b. 2008 stress echo negative for ischemia, but with hypertensive response; c. 08/2016 Cath: D1 25%, otw nl.  . Normocytic anemia 2018   Iron, vit B12, folate all normal 12/2016.  Marland Kitchen Open-angle glaucoma 2016   Dr. Shirley Muscat, (bilateral)---responding to topical therapy  . Osteoporosis 03/2016; 03/2018   03/2016 DEXA T-score -2.2.  03/2018 T-score L femoral neck  -2.7--boniva started.  . Pneumonia    hospitalization 11/2016; hypoxemic resp failure--d/c'd home with home oxygen therapy  . Seasonal allergic rhinitis   . Subclinical hyperthyroidism 2018  . Takotsubo cardiomyopathy    a. 08/2016  Echo: EF 30-35%, gr1DD, PASP 40mHg;  b. 08/2016 Cath: nonobs Dzs;  c. 11/2016 Echo: EF 40-45%, no rwma, nl RV fxn, mild TR, PASP 326mg.  ECHO 02/2017 EF 50-55%, grd II DD---essentially resolved Takotsubo 02/2017.  . [place] de pointes ([facility] [place]11/2017   a. 08/2016 in setting of diverticulitis & pneumoperitoneum and Takotsubo CM -->prolonged QT, seen by EP-->avoid meds with potential for QT prolongation.    Patient Active Problem List   Diagnosis Date Noted  . Acute renal failure ([facility]  . Fall   . Recurrent falls  10/08/2019  . Hypocalcemia 10/08/2019  . Preoperative cardiovascular examination 01/12/2017  . Diarrhea 12/02/2016  . CAP (community acquired pneumonia) 12/01/2016  . Essential hypertension 12/01/2016  . Chronic diastolic (congestive) heart failure (HCH[place]02/05/2017  . Cerebrovascular accident (CVA) ([facility]  . Hypertension associated with chronic kidney disease due to type 2 diabetes mellitus (HCNew Hope11/25/2017  . Cardiac arrest with ventricular fibrillation ([place]CLa[place]11/22/2017  . Takotsubo cardiomyopathy 09/14/2016  . Colonic diverticular abscess   . Diverticulitis of large intestine with perforation and abscess 09/11/2016  . Controlled type 2 diabetes mellitus with hyperglycemia, with long-term current use of insulin ([facility]CC[place]08/22/2017  . Hyperlipidemia 06/14/2016  . COPD exacerbation (HCLord[place]12/26/2014  . Non-occlusive coronary artery disease 06/06/2012  . Allergic rhinitis 01/06/2011  . TOBACCO USE, QUIT 07/29/2009  . Uncontrolled type 2 diabetes mellitus with complication ([id number]/[id number]. GERD (gastroesophageal reflux disease) 04/24/2007    Past Surgical History:  Procedure Laterality Date  . ABDOMINAL HYSTERECTOMY  1997  . APPENDECTOMY    . CARDIAC CATHETERIZATION N/A 09/14/2016   Procedure: Left Heart Cath and Coronary Angiography;  Surgeon: ThTroy SineMD;  [facility];  Service: Cardiovascular; Mild nonobstructive CAD with 85% smooth narrowing in the first diagonal branch of the LAD; 10% smooth narrowing of the ostial proximal left circumflex coronary artery; and a normal dominant RCA.  . Marland KitchenARPAL TUNNEL RELEASE    . CATARACT EXTRACTION Bilateral   . CHOLECYSTECTOMY  2002  . COLONOSCOPY  10/24/00; 08/27/19   2002 normal.  [employer] hemoccult testing via [facility] 06/18/15 was NEG.  2020->diverticulosis o/w nl.  . dexa  03/2016; 03/2018   03/2018 T-score L femoral neck  -2.7 (worsened compared to osteopenic range 2017.)  . ESOPHAGOGASTRODUODENOSCOPY  08/27/2019   Normal.    . IR GENERIC HISTORICAL  09/19/2016   IR SINUS/FIST TUBE CHK-NON GI 09/19/2016 JaCorrie MckusickDO [facility]INTER[facility]  . IR GENERIC HISTORICAL  10/06/2016   IR RADIOLOGIST EVAL & MGMT 10/06/2016 [facility]  . IR GENERIC HISTORICAL  10/26/2016   IR RADIOLOGIST EVAL & MGMT 10/26/2016 [facility]  . IR GENERIC HISTORICAL  11/03/2016   IR RADIOLOGIST EVAL & MGMT 11/03/2016 WeArdis RowanPA-C [facility] INTER[facility]  . IR GENERIC HISTORICAL  11/24/2016   IR RADIOLOGIST EVAL & MGMT 11/24/2016 WeArdis RowanPA-C [facility]  . IR GENERIC HISTORICAL  12/05/2016   Fistula smaller/improving.  IR SINUS/FIST TUBE CHK-NON GI 12/05/2016 JoSandi MariscalMD [facility]INTER[facility]  . IR GENERIC HISTORICAL  12/22/2016   IR RADIOLOGIST EVAL & MGMT 12/22/2016 MiGreggory KeenMD [facility]  . IR RADIOLOGIST EVAL & MGMT  01/10/2017  . IR RADIOLOGIST EVAL & MGMT  02/09/2017  . IR SINUS/FIST TUBE CHK-NON GI  03/28/2017  . LEFT HEART CATHETERIZATION WITH CORONARY ANGIOGRAM N/A 05/30/2012   Procedure: LEFT HEART CATHETERIZATION WITH CORONARY ANGIOGRAM;  Surgeon: ChBurnell Blanks  MD;  Location: [facility] CATH LAB;  Service: Cardiovascular: Mild, non-obstructive CAD  . TRANSTHORACIC ECHOCARDIOGRAM  09/14/2016; 11/2016   08/2016: EF 30-35 %, Akinesis of the mid-apicalanteroseptal myocardium.  Grade I DD.  Mild pulm HTN.  Repeat echo 11/2016, EF 40-45%, no rwma, nl RV fxn, mild TR, PASP 22mHg.    .Marland KitchenTRANSTHORACIC ECHOCARDIOGRAM  03/07/2017   EF 50-55%. Hypokinesis of the distal septum with overall low normal LV,  systolic function; mild diastolic dysfunction with elevated LV  filling pressure; mildly calcified aortic valve with mild AI; small pericardial effusion     OB History   No obstetric history on file.     Family History  Problem Relation Age of Onset  . Heart failure Mother   . Kidney disease Mother        renal failure  . Diabetes Mother   . Diabetes Father   . Diabetes Brother   . Heart disease Brother   .  Heart attack Brother   . Heart attack Brother   . Heart attack Brother   . Pancreatic cancer Brother   . Colon cancer Neg Hx   . Esophageal cancer Neg Hx   . Stomach cancer Neg Hx   . Rectal cancer Neg Hx     Social History   Tobacco Use  . Smoking status: Former Smoker    Packs/day: 1.00    Years: 45.00    Pack years: 45.00    Types: Cigarettes    Quit date: 10/24/2008    Years since quitting: 11.2  . Smokeless tobacco: Never Used  Substance Use Topics  . Alcohol use: No    Alcohol/week: 0.0 standard drinks  . Drug use: No    Home Medications Prior to Admission medications   Medication Sig Start Date End Date Taking? Authorizing Provider  albuterol (PROVENTIL HFA;VENTOLIN HFA) 108 (90 Base) MCG/ACT inhaler INHALE 2 PUFFS BY MOUTH EVERY 6 HOURS AS NEEDED FOR WHEEZING OR SHORTNESS OF BREATH Patient not taking: No sig reported 12/21/18   McGowen, PAdrian Blackwater MD  albuterol (PROVENTIL) (2.5 MG/3ML) 0.083% nebulizer solution USE 1 VIAL IN NEBULIZER EVERY 4 HOURS AS NEEDED FOR WHEEZING AND FOR SHORTNESS OF BREATH 01/27/20   McGowen, PAdrian Blackwater MD  aspirin 81 MG EC tablet Take 81 mg by mouth daily.     [provider]  atorvastatin (LIPITOR) 40 MG tablet Take 1 tablet (40 mg total) by mouth daily. 12/13/18   MTammi Sou MD  buPROPion (WELLBUTRIN XL) 150 MG 24 hr tablet Take 1 tablet by mouth once daily 12/03/19   McGowen, PAdrian Blackwater MD  cetirizine (ZYRTEC) 10 MG tablet Take 10 mg by mouth daily.    [provider]  dexamethasone (DECADRON) 6 MG tablet Take 1 tablet (6 mg total) by mouth daily. Patient not taking: Reported on 11/07/2019 10/15/19   GCaren Griffins MD  escitalopram (LEXAPRO) 20 MG tablet Take 1 tablet by mouth once daily Patient taking differently: Take 20 mg by mouth daily.  06/10/19   McGowen, PAdrian Blackwater MD  ferrous sulfate 325 (65 FE) MG EC tablet Take 325 mg by mouth 2 (two) times daily.    [provider]  FLOVENT HFA 220 MCG/ACT inhaler  Inhale 2 puffs by mouth twice daily 01/27/20   McGowen, PAdrian Blackwater MD  ibandronate (BONIVA) 150 MG tablet Take 1 tablet (150 mg total) by mouth every 30 (thirty) days. Take in the morning with a full glass of water, on an empty stomach,  and do not take anything else by mouth or lie down for the next 30 min. Patient not taking: Reported on 11/07/2019 05/06/19   [name], MD  Insulin Isophane & Regular Human (HUMULIN 70/30 KWIKPEN) (70-30) 100 UNIT/ML PEN Take 4 U SQ with breakfast and 2 U SQ with supper. 12/18/19   McGowen, [name], MD  metFORMIN (GLUCOPHAGE) 500 MG tablet TAKE 1 TABLET BY MOUTH TWICE DAILY WITH A MEAL Patient taking differently: Take 500 mg by mouth 2 (two) times daily with a meal.  08/05/19   McGowen, [name], MD  metoprolol tartrate (LOPRESSOR) 25 MG tablet Take 1 tablet (25 mg total) by mouth 2 (two) times daily. NEED OFFICE VISIT FOR FUTURE REFILL 01/20/20   Leonie Man, MD  Multiple Vitamins-Minerals (MULTIVITAMIN WOMEN PO) Take by mouth daily.    [provider]  ONE TOUCH ULTRA TEST test strip USE  STRIP TO CHECK GLUCOSE THREE TIMES DAILY 05/31/18   McGowen, [name], MD  [facility] DELICA LANCETS 02D MISC USE 1  TO CHECK GLUCOSE THREE TIMES DAILY 05/04/18   McGowen, [name], MD  pantoprazole (PROTONIX) 40 MG tablet Take 1 tablet by mouth once daily 01/09/20   McGowen, [name], MD  potassium chloride (KLOR-CON) 10 MEQ tablet Take 1 tablet by mouth once daily Patient taking differently: Take 10 mEq by mouth daily.  08/05/19   McGowen, [name], MD    Allergies    Lactose intolerance (gi) and Ibuprofen  Review of Systems   Review of Systems  Constitutional: Negative for chills and fever.  Respiratory: Negative.   Cardiovascular: Negative.   Gastrointestinal: Negative.   Genitourinary: Negative.   Musculoskeletal:       See HPI.  Skin: Negative.   Neurological: Negative.     Physical Exam Updated Vital Signs BP 116/77 (BP Location: Left Arm)    Pulse 92   Temp 98 F (36.7 C) (Oral)   Resp 19   SpO2 100%   Physical Exam Vitals and nursing note reviewed.  Constitutional:      Appearance: She is well-developed.  HENT:     Head: Atraumatic.  Pulmonary:     Effort: Pulmonary effort is normal.  Chest:     Chest wall: No tenderness.  Abdominal:     Tenderness: There is no abdominal tenderness.  Musculoskeletal:     Cervical back: Normal range of motion.  Skin:    General: Skin is warm and dry.     Comments: Large aged bruise to left lateral hip. No bony abnormality. No shortening or rotation of the left lower extremity.   Neurological:     Mental Status: She is alert and oriented to person, place, and time.     Motor: No weakness.     ED Results / Procedures / Treatments   Labs (all labs ordered are listed, but only abnormal results are displayed) Labs Reviewed  CBC - Abnormal; Notable for the following components:      Result Value   Hemoglobin 11.7 (*)    RDW 15.7 (*)    All other components within normal limits  COMPREHENSIVE METABOLIC PANEL - Abnormal; Notable for the following components:   Sodium 134 (*)    Glucose, Bld 194 (*)    Calcium 8.2 (*)    All other components within normal limits    EKG None  Radiology DG Hip Unilat With Pelvis 2-3 Views Left  Result Date: 02/03/2020 CLINICAL DATA:  Left hip pain  after fall. EXAM: DG HIP (WITH OR WITHOUT PELVIS) 2-3V LEFT COMPARISON:  Pelvis and right hip x-rays dated October 08, 2019. FINDINGS: There is no evidence of hip fracture or dislocation. There is no evidence of arthropathy or other focal bone abnormality. IMPRESSION: Negative. Electronically Signed   By: Titus Dubin M.D.   On: 02/03/2020 17:38    Procedures Procedures (including critical care time)  Medications Ordered in ED Medications - No data to display  ED Course  I have reviewed the triage vital signs and the nursing notes.  Pertinent labs & imaging results that were available  during my care of the patient were reviewed by me and considered in my medical decision making (see chart for details).    MDM Rules/Calculators/A&P                      Patient to ED for imaging of left hip as recommended by her doctor for evaluation of fracture after fall.   Imaging is negative for fracture. She has been ambulatory in the department. No other injury suspected. She has been seen by Dr. Christy Gentles and is felt appropriate for discharge home.   Final Clinical Impression(s) / ED Diagnoses Final diagnoses:  None   1. Left hip contusion  Rx / DC Orders ED Discharge Orders    None       Charlann Lange, Hershal Coria 02/04/20 0026    Ripley Fraise, MD 02/04/20 [phone]

## 2020-02-08 DIAGNOSIS — S7002XA Contusion of left hip, initial encounter: Secondary | ICD-10-CM | POA: Diagnosis not present

## 2020-02-16 ENCOUNTER — Other Ambulatory Visit: Payer: Self-pay | Admitting: Cardiology

## 2020-02-16 ENCOUNTER — Other Ambulatory Visit: Payer: Self-pay | Admitting: Family Medicine

## 2020-02-19 NOTE — Telephone Encounter (Signed)
Cardiologist told daughter to let Dr Anitra Lauth to take over all prescriptions  metFORMIN (GLUCOPHAGE) 500 MG tablet [[id number]   She called this one in on Monday and is still not been approved yet. Please approve and send to pharmacy.  [place], [place].

## 2020-02-20 MED ORDER — METFORMIN HCL 500 MG PO TABS: ORAL_TABLET | ORAL | 0 refills | Status: DC

## 2020-02-20 NOTE — Telephone Encounter (Signed)
RF request for Metoprolol LOV:11/07/19 Next ov: advised to f/u 2 mo. Last written:01/20/20(90,0) originally prescribed by Dr.Harding but is deferring all Rx be filled by you.  RF request for Metformin  LOV:11/07/19 Next ov: advised to f/u 2 mo. Last written:08/05/19(180,0)  Medications pending

## 2020-03-02 IMAGING — MG DIGITAL SCREENING BILATERAL MAMMOGRAM WITH TOMO AND CAD
8 series · 8 of 24 positions shown · non-contrast
Comparison: Previous exam(s).

CLINICAL DATA: Screening.

EXAM:
DIGITAL SCREENING BILATERAL MAMMOGRAM WITH TOMO AND CAD

[L MLO synth-2D]
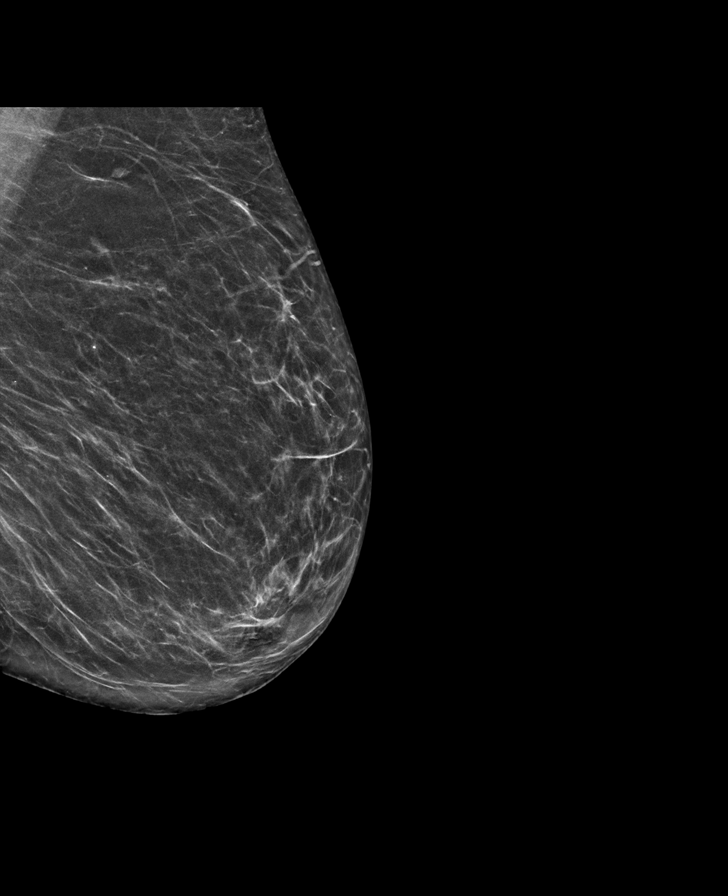

[L CC synth-2D]
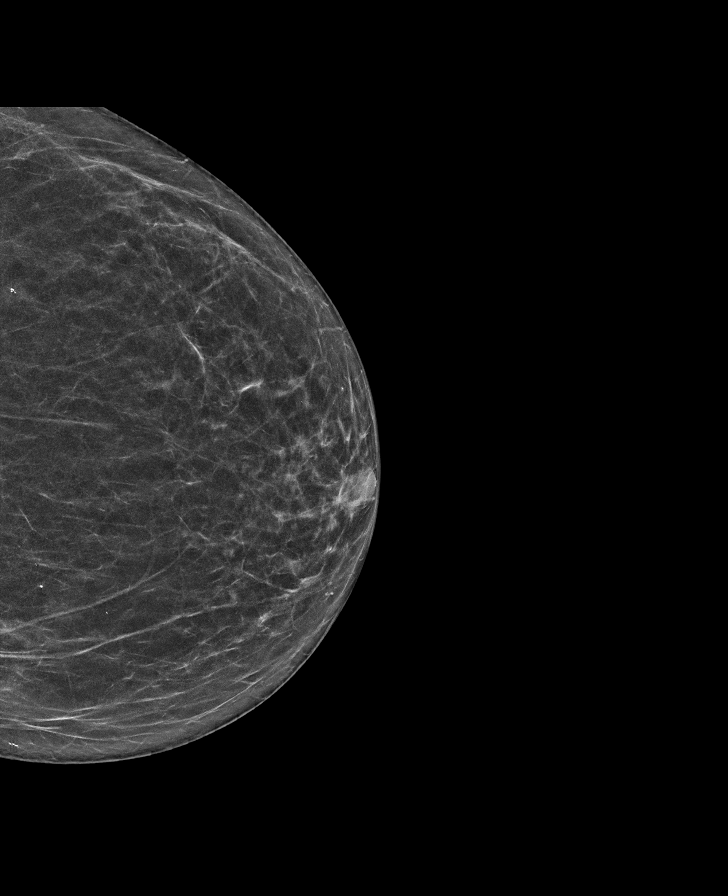

[R CC synth-2D]
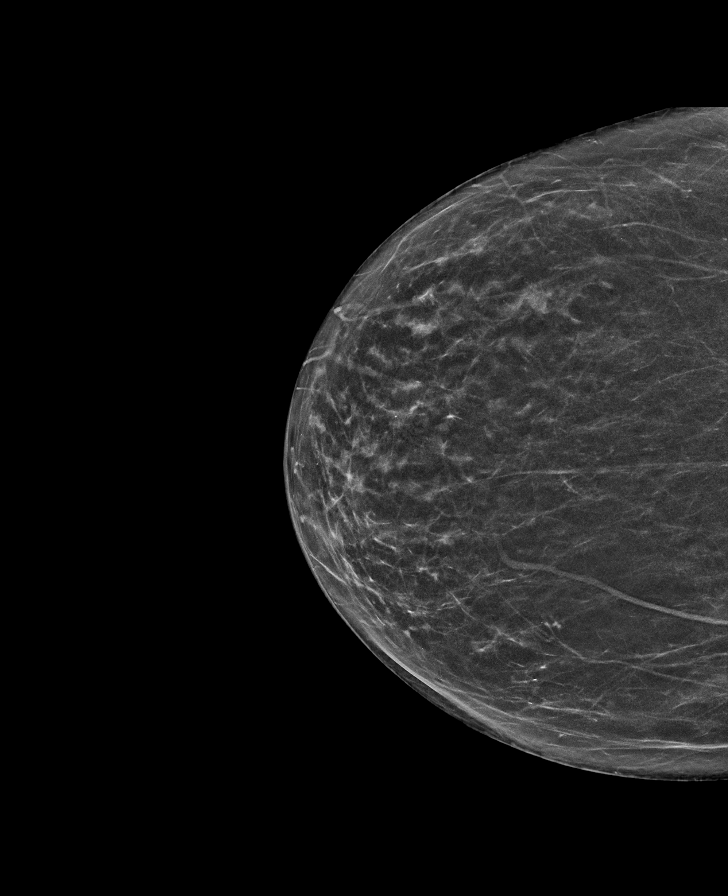

[R MLO synth-2D]
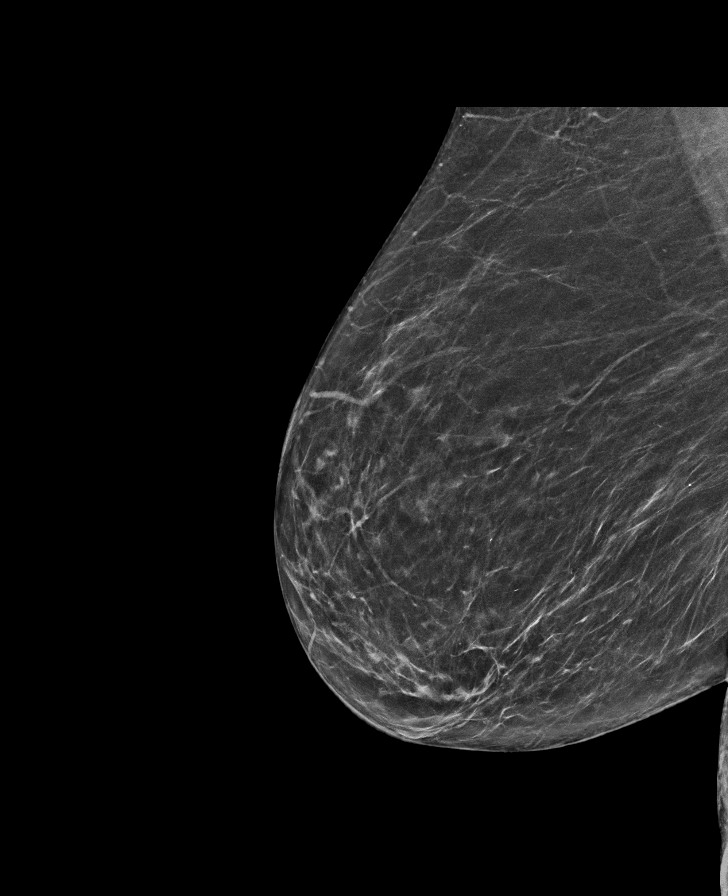

[R MLO tomo · tomo slice 33/66.0]
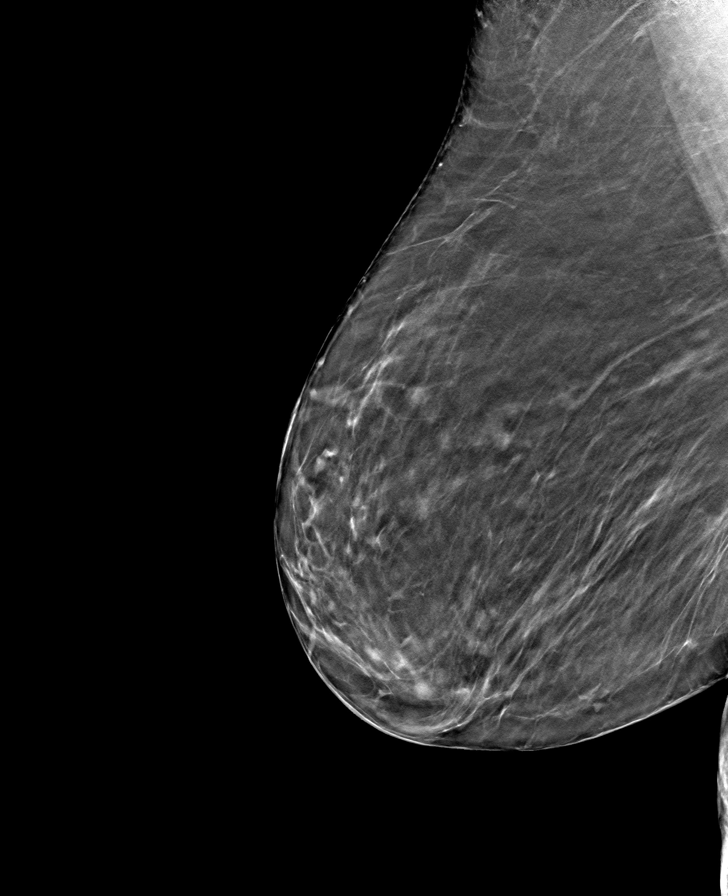

[L MLO tomo · tomo slice 33/65.0]
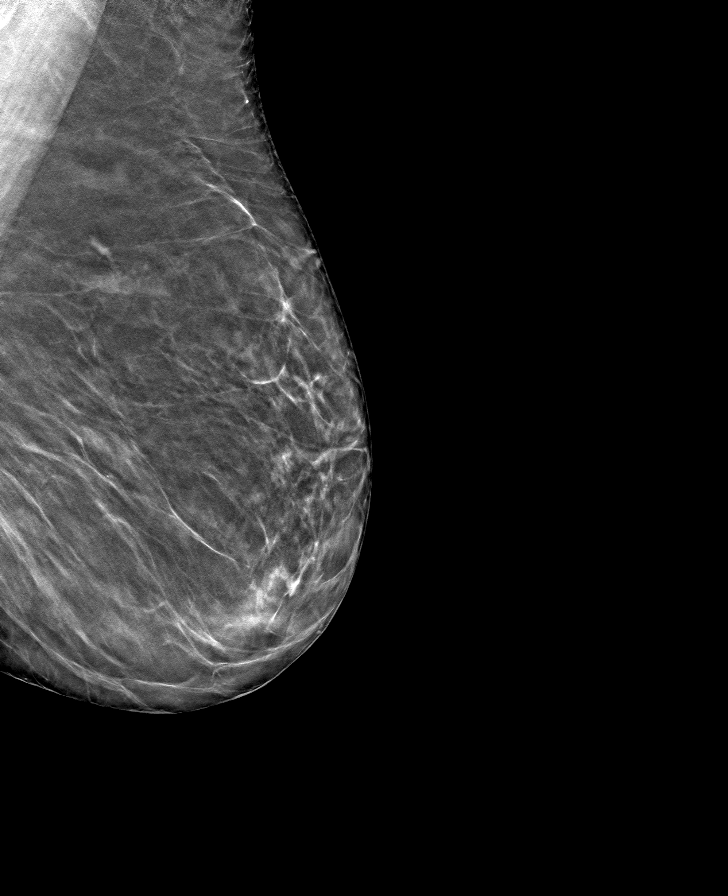

[L CC tomo · tomo slice 29/58.0]
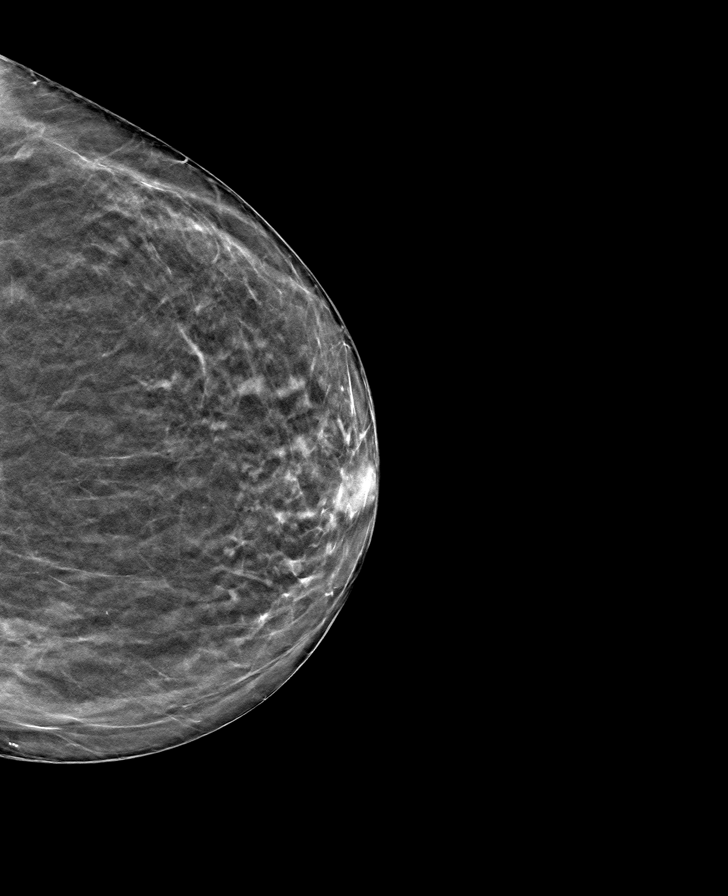

[R CC tomo · tomo slice 28/55.0]
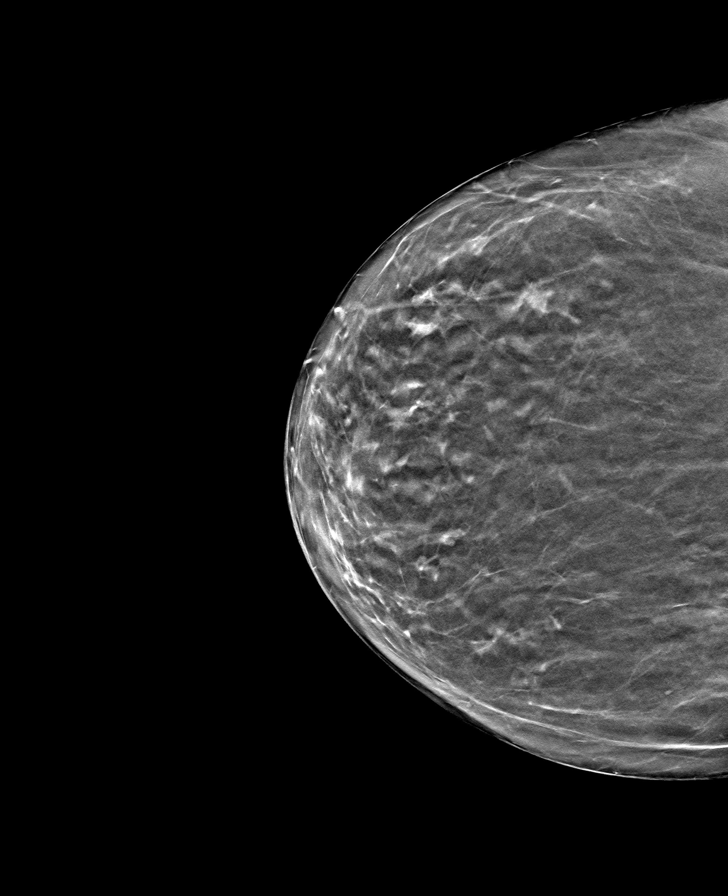

[8 of 24 positions shown; findings below may reference images not displayed]

ACR Breast Density Category b: There are scattered areas of
fibroglandular density.
FINDINGS: There are no findings suspicious for malignancy. Images were
processed with CAD.
IMPRESSION: No mammographic evidence of malignancy. A result letter of this
screening mammogram will be mailed directly to the patient.

RECOMMENDATION:
Screening mammogram in one year. (Code:[id number])

BI-RADS CATEGORY  1: Negative.

## 2020-03-19 ENCOUNTER — Other Ambulatory Visit: Payer: Self-pay | Admitting: Family Medicine

## 2020-03-19 DIAGNOSIS — Z1231 Encounter for screening mammogram for malignant neoplasm of breast: Secondary | ICD-10-CM

## 2020-03-25 ENCOUNTER — Other Ambulatory Visit: Payer: Self-pay

## 2020-03-25 ENCOUNTER — Encounter (INDEPENDENT_AMBULATORY_CARE_PROVIDER_SITE_OTHER): Payer: Medicare Other | Admitting: Ophthalmology

## 2020-03-25 DIAGNOSIS — E113512 Type 2 diabetes mellitus with proliferative diabetic retinopathy with macular edema, left eye: Secondary | ICD-10-CM | POA: Diagnosis not present

## 2020-03-25 DIAGNOSIS — I10 Essential (primary) hypertension: Secondary | ICD-10-CM

## 2020-03-25 DIAGNOSIS — E113591 Type 2 diabetes mellitus with proliferative diabetic retinopathy without macular edema, right eye: Secondary | ICD-10-CM

## 2020-03-25 DIAGNOSIS — E11311 Type 2 diabetes mellitus with unspecified diabetic retinopathy with macular edema: Secondary | ICD-10-CM

## 2020-03-25 DIAGNOSIS — H35033 Hypertensive retinopathy, bilateral: Secondary | ICD-10-CM | POA: Diagnosis not present

## 2020-03-25 DIAGNOSIS — H43813 Vitreous degeneration, bilateral: Secondary | ICD-10-CM

## 2020-03-25 LAB — HM DIABETES EYE EXAM

## 2020-03-26 ENCOUNTER — Encounter: Payer: Self-pay | Admitting: Family Medicine

## 2020-04-12 ENCOUNTER — Other Ambulatory Visit: Payer: Self-pay | Admitting: Family Medicine

## 2020-04-12 ENCOUNTER — Other Ambulatory Visit: Payer: Self-pay | Admitting: Cardiology

## 2020-04-14 MED ORDER — METOPROLOL TARTRATE 25 MG PO TABS: ORAL_TABLET | ORAL | 0 refills | Status: DC

## 2020-04-14 NOTE — Telephone Encounter (Signed)
Contracted patient and scheduled her for OV 04/24/20.  Sent in # 30 for RX's to last her until her upcoming appt.

## 2020-04-21 ENCOUNTER — Other Ambulatory Visit: Payer: Self-pay | Admitting: Family Medicine

## 2020-04-21 IMAGING — CT CT ABDOMEN AND PELVIS WITH CONTRAST
2 of 5 series · 16 of 46 positions shown, 18 images · IV contrast (APPLIED)
Comparison: CT abdomen pelvis dated August 24, 2018.

CLINICAL DATA: Left lower quadrant abdominal pain. History of
diverticulitis.

EXAM:
CT ABDOMEN AND PELVIS WITH CONTRAST
TECHNIQUE: Multidetector CT imaging of the abdomen and pelvis was performed
using the standard protocol following bolus administration of
intravenous contrast.
CONTRAST:  100mL OMNIPAQUE IOHEXOL 300 MG/ML  SOLN

[Series 3: abd/ pelvis 5.0 i30f 2 · axial · 0.70mm/px · z∈[+771,+1181]mm · 13 of 94 slices shown, 15 images]
[im 6/94  soft-tissue]
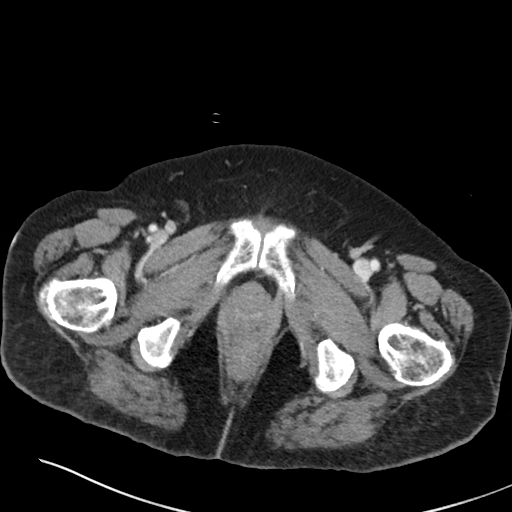
[im 6/94  bone]
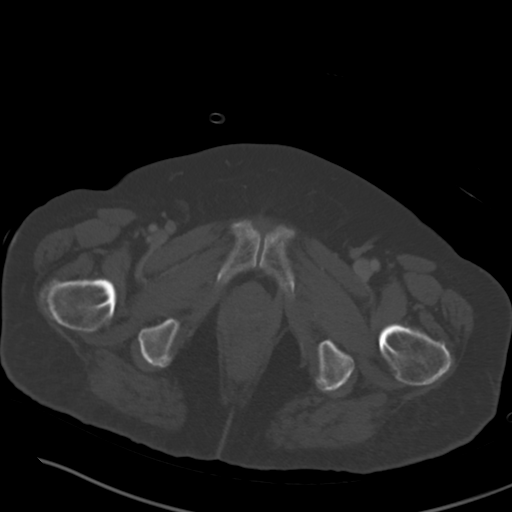
[im 11/94  soft-tissue]
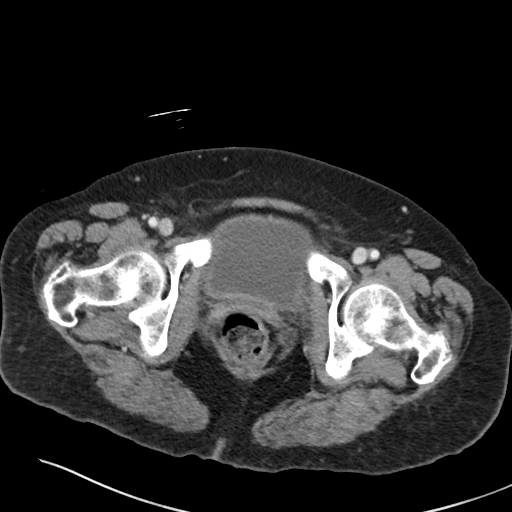
[im 21/94  soft-tissue]
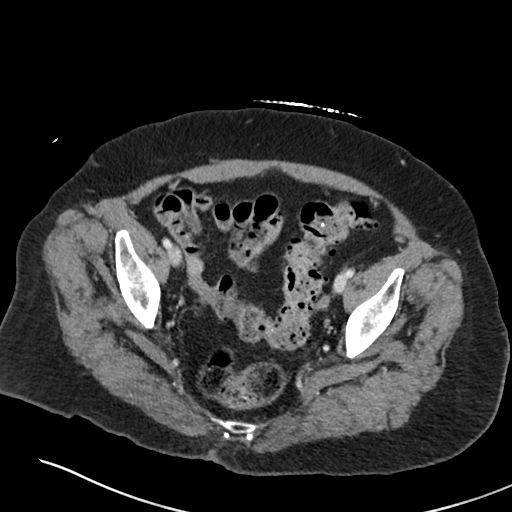
[im 26/94  soft-tissue]
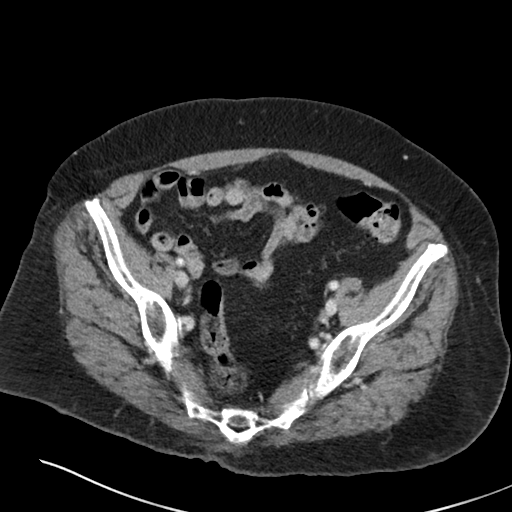
[im 32/94  soft-tissue]
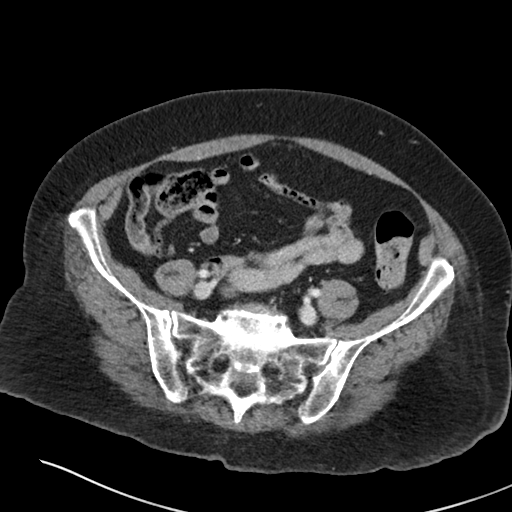
[im 42/94  soft-tissue]
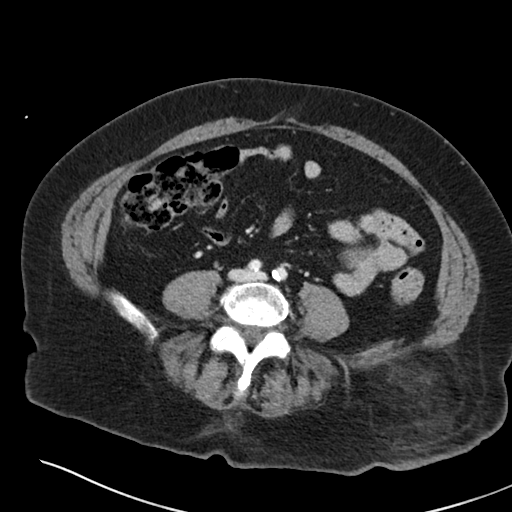
[im 47/94  soft-tissue]
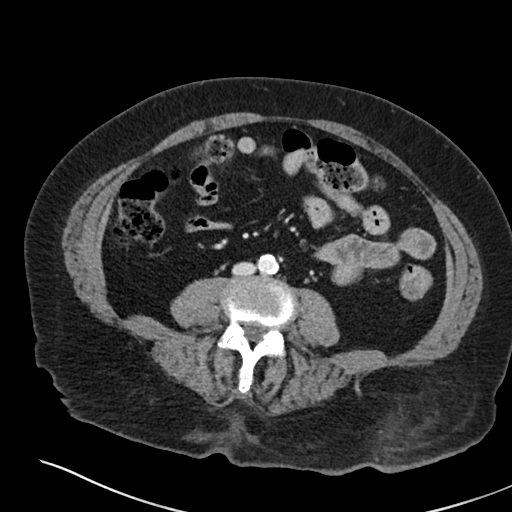
[im 52/94  soft-tissue]
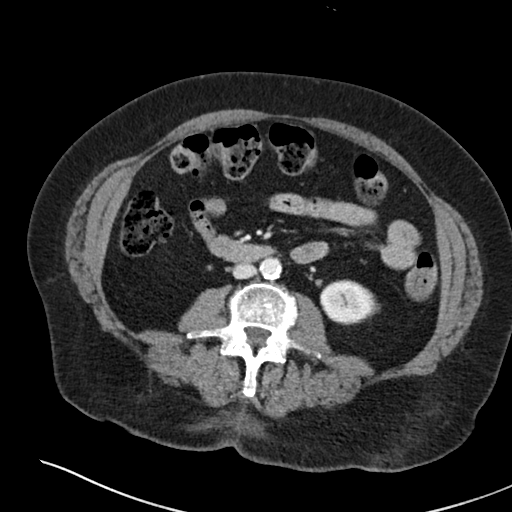
[im 63/94  soft-tissue]
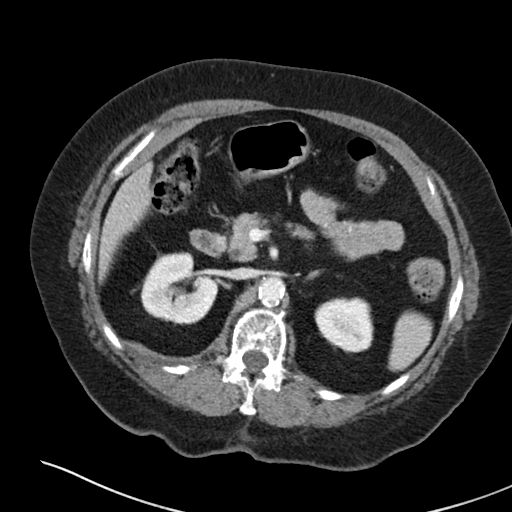
[im 63/94  bone]
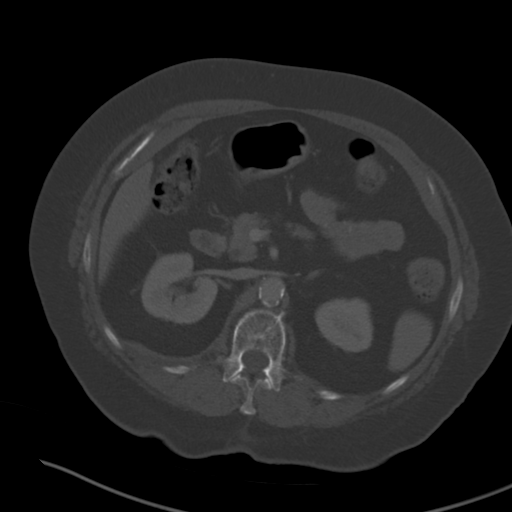
[im 68/94  soft-tissue]
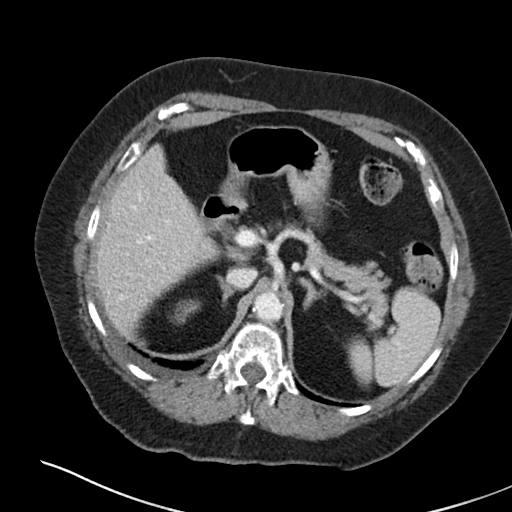
[im 73/94  soft-tissue]
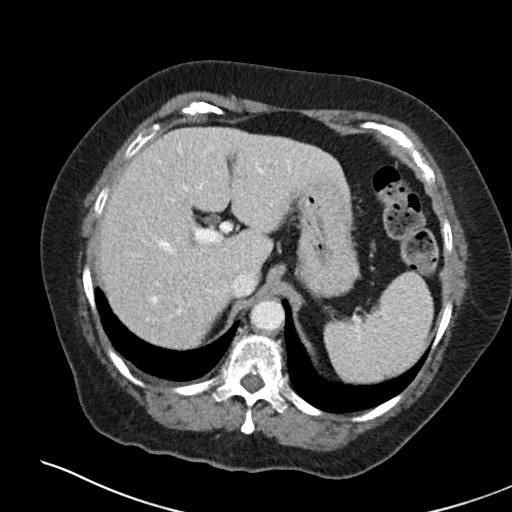
[im 83/94  soft-tissue]
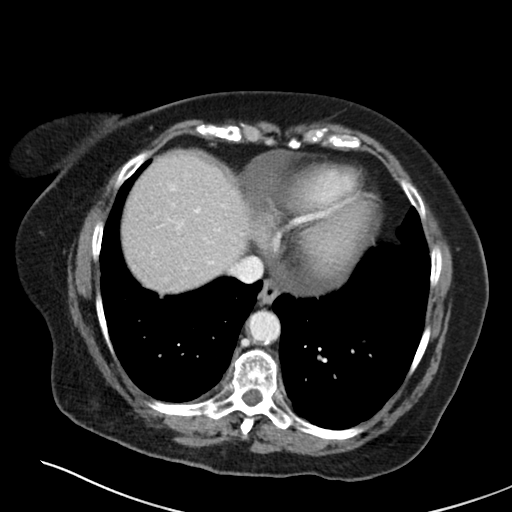
[im 88/94  soft-tissue]
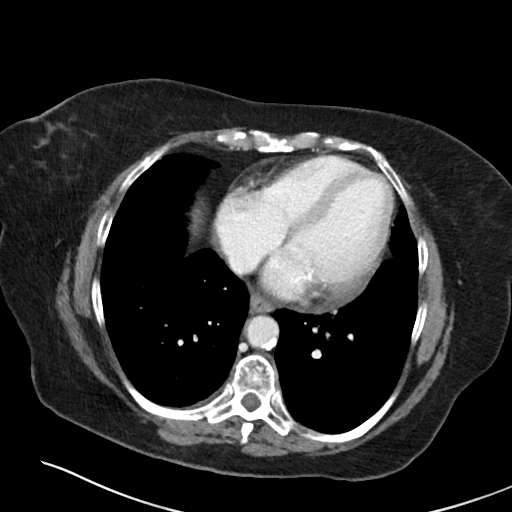

[Series 6: coronal soft tissue · coronal · 0.66mm/px · 3 of 101 slices shown]
[im 34/101  soft-tissue]
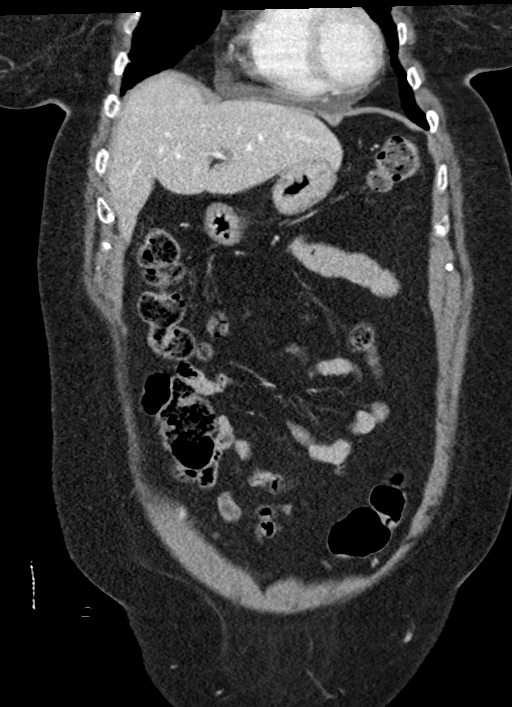
[im 45/101  soft-tissue]
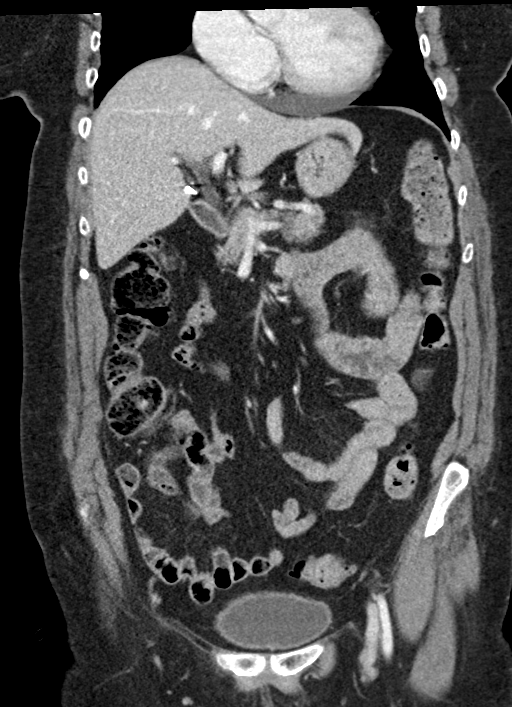
[im 56/101  soft-tissue]
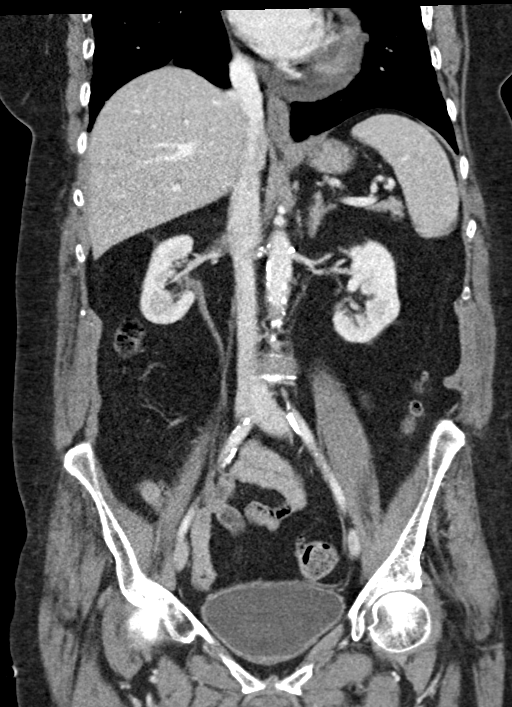

[16 of 46 positions shown; findings below may reference images not displayed]

FINDINGS: Lower chest: No acute abnormality.

Hepatobiliary: No focal liver abnormality is seen. Status post
cholecystectomy. No biliary dilatation.

Pancreas: Unremarkable. No pancreatic ductal dilatation or
surrounding inflammatory changes.

Spleen: Normal in size. Subcentimeter low-density lesion within the
spleen remains too small to characterize, but is unchanged.

Adrenals/Urinary Tract: Adrenal glands are unremarkable. Tiny
subcentimeter low-density lesions in the left kidney are too small
to characterize. No renal or ureteral calculi. No hydronephrosis.
Mild circumferential bladder wall thickening.

Stomach/Bowel: Stomach is within normal limits. Appendix appears
normal. No evidence of bowel wall thickening, distention, or
inflammatory changes. Mild sigmoid diverticulosis again noted.

Vascular/Lymphatic: Aortic atherosclerosis. No enlarged abdominal or
pelvic lymph nodes.

Reproductive: Status post hysterectomy. No adnexal masses.

Other: Unchanged tiny fat containing umbilical hernia. No free fluid
or pneumoperitoneum.

Musculoskeletal: No acute or significant osseous findings.
IMPRESSION: 1. No acute intra-abdominal process. Sigmoid diverticulosis without
evidence of acute diverticulitis.
2. Mild circumferential bladder wall thickening. Correlate with
urinalysis to exclude cystitis.
3.  Aortic atherosclerosis ([id number]).

## 2020-04-23 DIAGNOSIS — S62609A Fracture of unspecified phalanx of unspecified finger, initial encounter for closed fracture: Secondary | ICD-10-CM

## 2020-04-23 DIAGNOSIS — R296 Repeated falls: Secondary | ICD-10-CM | POA: Diagnosis not present

## 2020-04-23 DIAGNOSIS — S62316A Displaced fracture of base of fifth metacarpal bone, right hand, initial encounter for closed fracture: Secondary | ICD-10-CM | POA: Diagnosis not present

## 2020-04-23 DIAGNOSIS — I959 Hypotension, unspecified: Secondary | ICD-10-CM | POA: Diagnosis not present

## 2020-04-23 HISTORY — DX: Fracture of unspecified phalanx of unspecified finger, initial encounter for closed fracture: S62.609A

## 2020-04-24 ENCOUNTER — Other Ambulatory Visit: Payer: Self-pay

## 2020-04-24 ENCOUNTER — Ambulatory Visit (INDEPENDENT_AMBULATORY_CARE_PROVIDER_SITE_OTHER): Payer: Medicare Other | Admitting: Family Medicine

## 2020-04-24 ENCOUNTER — Encounter: Payer: Self-pay | Admitting: Family Medicine

## 2020-04-24 VITALS — BP 118/70 | HR 107 | Temp 97.7°F | Resp 16 | Ht 62.0 in | Wt 137.4 lb

## 2020-04-24 DIAGNOSIS — N183 Chronic kidney disease, stage 3 unspecified: Secondary | ICD-10-CM | POA: Diagnosis not present

## 2020-04-24 DIAGNOSIS — E7800 Pure hypercholesterolemia, unspecified: Secondary | ICD-10-CM

## 2020-04-24 DIAGNOSIS — I10 Essential (primary) hypertension: Secondary | ICD-10-CM | POA: Diagnosis not present

## 2020-04-24 DIAGNOSIS — E1122 Type 2 diabetes mellitus with diabetic chronic kidney disease: Secondary | ICD-10-CM

## 2020-04-24 DIAGNOSIS — Z862 Personal history of diseases of the blood and blood-forming organs and certain disorders involving the immune mechanism: Secondary | ICD-10-CM | POA: Diagnosis not present

## 2020-04-24 DIAGNOSIS — E118 Type 2 diabetes mellitus with unspecified complications: Secondary | ICD-10-CM

## 2020-04-24 DIAGNOSIS — I959 Hypotension, unspecified: Secondary | ICD-10-CM | POA: Diagnosis not present

## 2020-04-24 DIAGNOSIS — S62306A Unspecified fracture of fifth metacarpal bone, right hand, initial encounter for closed fracture: Secondary | ICD-10-CM

## 2020-04-24 DIAGNOSIS — R296 Repeated falls: Secondary | ICD-10-CM

## 2020-04-24 DIAGNOSIS — R5381 Other malaise: Secondary | ICD-10-CM

## 2020-04-24 DIAGNOSIS — R42 Dizziness and giddiness: Secondary | ICD-10-CM

## 2020-04-24 LAB — BASIC METABOLIC PANEL
BUN: 17 mg/dL (ref 6–23)
CO2: 28 mEq/L (ref 19–32)
Calcium: 9.5 mg/dL (ref 8.4–10.5)
Chloride: 99 mEq/L (ref 96–112)
Creatinine, Ser: 0.91 mg/dL (ref 0.40–1.20)
GFR: 60.79 mL/min (ref >60.00)
Glucose, Bld: 164 mg/dL — ABNORMAL HIGH (ref 70–99)
Potassium: 4.6 mEq/L (ref 3.5–5.1)
Sodium: 137 mEq/L (ref 135–145)

## 2020-04-24 LAB — CBC
HCT: 38.1 % (ref 36.0–46.0)
Hemoglobin: 12.4 g/dL (ref 12.0–15.0)
MCHC: 32.7 g/dL (ref 30.0–36.0)
MCV: 88.9 fl (ref 78.0–100.0)
Platelets: 287 10*3/uL (ref 150.0–400.0)
RBC: 4.29 Mil/uL (ref 3.87–5.11)
RDW: 17.5 % — ABNORMAL HIGH (ref 11.5–15.5)
WBC: 9.6 10*3/uL (ref 4.0–10.5)

## 2020-04-24 LAB — HEMOGLOBIN A1C: Hgb A1c MFr Bld: 6.5 % (ref 4.6–6.5)

## 2020-04-24 MED ORDER — METOPROLOL TARTRATE 25 MG PO TABS: ORAL_TABLET | ORAL | 3 refills | Status: DC

## 2020-04-24 NOTE — Addendum Note (Signed)
Addended by: Ralph Dowdy on: 04/24/2020 11:18 AM   Modules accepted: Orders

## 2020-04-24 NOTE — Progress Notes (Signed)
OFFICE VISIT  04/24/2020   CC:  Chief Complaint  Patient presents with  . Follow-up    RCI, pt is not fasting   HPI:    Patient is a 74 y.o. Caucasian female who presents for 6 mo follow up chronic medical illnesses. A/P as of last visit: "1) Debilitated pt: is s/p SNF stay and is improved, needs to continue [facility] PT to try to help her maintain strength/function.  2) Covid 19 infection: resolved.  3) DM 2, control has been good lately. Stop metformin.  Continue 70/30 insulin 4 U qAM and 2 U qsupper.  4) Hyperkalemia: 5.4 on labs 3 wks ago.  Stop K, check BMET.  5) IDA, hem + 05/2019. EGD/colonoscopies unrevealing. Givens capsule study--the capsule could not be located. Tolerating qd iron. Last ferritin showed improvement, Hb stable around 10.5 the last 2 mo.  6) CRI III: sCr near baseline last check 3 wks ago. BMET future.  Pt was advised to f/u in 2 mo but did not do so.  INTERIM HX: Pt fell 2 d/a and hit R arm, went to sit down and missed chair.  Fractured R 5th metacarpal, sees ortho today. Feel back in April and hit hip, no fracture--was trying to do something in bathroom by herself and got off balance. Last 2 wks has not had to use a walker at all! Stays at home for the most part, went to church 2 wks ago. Still living with her daughter.  Glucoses: "going real well".  No log brought for review today.  Nothing over 200 and lowest was 90.   Taking 70/30 4 U BF and 2U supper. Not longer takes metformin. Her feet are cold a lot but no numbness, tingling, or burning.  Gets some orthostatic dizziness, bp 94/55 yesterday at urgent care. No other bp monitoring being done at home or at pharmacy. Drinks lots of fluids during the day.  She always has someone next to her to help her stand, waits briefly before walking.  Teeth not fitting well, affects her chewing/eating. Some occ "allergy" cough, otherwise no cough or wheeze or sob.  ROS: no fevers, no CP, no SOB, no  wheezing, no cough, no HAs, no rashes, no melena/hematochezia.  No polyuria or polydips, ia.  No myalgias or arthralgias.  No focal weakness, paresthesias, or tremors.  No acute vision or hearing abnormalities. No n/v/d or abd pain.  No palpitations.     Past Medical History:  Diagnosis Date  . Age-related nuclear cataract of both eyes 2016   +cortical age related cataracts OU   . Anxiety   . Arthritis    hands  . Bilateral diabetic retinopathy (HCC) 2015   Dr. Zigmund Daniel  . Blood transfusion without reported diagnosis    when taking chemo  . Chronic renal insufficiency, stage 3 (moderate)    GFR 50s  . 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  Marland Kitchen COPD (chronic obstructive pulmonary disease) ([place])   . CVA (cerebral vascular accident) ([place]) 09/2016   MRI did show a left-sided small ischemic stroke which was acute but likely incidental (MRI was done b/c pt had TIA sx's in hospital).  Neuro put her on plavix at that time.  Cardiology d/c'd her plavix 08/2017.  . Depression   . Diabetes mellitus with complication (HCC)    diab retinopathy OU (laser)  . GERD (gastroesophageal reflux disease)    protonix  . History of concussion 08/25/2018   w/out loss of  consciousness--mild increase in baseline memory impairment after.  CT head neg acute.  Marland Kitchen History of diverticulitis of colon    with abscess; required IR percutaneous drain placed 09/2016.  Marland Kitchen Hyperlipidemia   . Hypertension   . Iron deficiency anemia    started iron 07/02/19.  EGD/colonoscopy 08/27/19 unrevealing. Capsule endoscopy to be done.  . Left bundle branch block (LBBB) 12/16/2016  . Lung cancer ([place])    non-small cell lung ca, stage III in 05/2011; systemic chemotherapy concurrent with radiation followed by prophylactic cranial irradiation and has been observation since July of 2010 with no evidence for disease recurrence-released from onc f/u 12/2014 (needs annual cxr by PCP).  CXR 08/2015  stable.  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 Earlie Server did bx and this was NEG for malignancy.  . Mild cognitive impairment with memory loss    Likely from brain radiation therapy  . Non-obstructive CAD (coronary artery disease)    a. Cath 2006 preserved LV fxn, scattered irregularities without critical stenosis; b. 2008 stress echo negative for ischemia, but with hypertensive response; c. 08/2016 Cath: D1 25%, otw nl.  . Normocytic anemia 2018   Iron, vit B12, folate all normal 12/2016.  Marland Kitchen Open-angle glaucoma 2016   Dr. Shirley Muscat, (bilateral)---responding to topical therapy  . Osteoporosis 03/2016; 03/2018   03/2016 DEXA T-score -2.2.  03/2018 T-score L femoral neck  -2.7--boniva started.  . Pneumonia    hospitalization 11/2016; hypoxemic resp failure--d/c'd home with home oxygen therapy  . Seasonal allergic rhinitis   . Subclinical hyperthyroidism 2018  . Takotsubo cardiomyopathy    a. 08/2016 Echo: EF 30-35%, gr1DD, PASP 26mHg;  b. 08/2016 Cath: nonobs Dzs;  c. 11/2016 Echo: EF 40-45%, no rwma, nl RV fxn, mild TR, PASP 354mg.  ECHO 02/2017 EF 50-55%, grd II DD---essentially resolved Takotsubo 02/2017.  . Torsades de pointes ([place]   a. 08/2016 in setting of diverticulitis & pneumoperitoneum and Takotsubo CM -->prolonged QT, seen by EP-->avoid meds with potential for QT prolongation.    Past Surgical History:  Procedure Laterality Date  . ABDOMINAL HYSTERECTOMY  1997  . APPENDECTOMY    . CARDIAC CATHETERIZATION N/A 09/14/2016   Procedure: Left Heart Cath and Coronary Angiography;  Surgeon: ThTroy SineMD;  [facility];  Service: Cardiovascular; Mild nonobstructive CAD with 85% smooth narrowing in the first diagonal branch of the LAD; 10% smooth narrowing of the ostial proximal left circumflex coronary artery; and a normal dominant RCA.  . Marland KitchenARPAL TUNNEL RELEASE    . CATARACT EXTRACTION Bilateral   . CHOLECYSTECTOMY  2002  . COLONOSCOPY  10/24/00; 08/27/19   2002  normal.  [employer] hemoccult testing via [facility] 06/18/15 was NEG.  2020->diverticulosis o/w nl.  . dexa  03/2016; 03/2018   03/2018 T-score L femoral neck  -2.7 (worsened compared to osteopenic range 2017.)  . ESOPHAGOGASTRODUODENOSCOPY  08/27/2019   Normal.  . IR GENERIC HISTORICAL  09/19/2016   IR SINUS/FIST TUBE CHK-NON GI 09/19/2016 JaCorrie MckusickDO [facility]  . IR GENERIC HISTORICAL  10/06/2016   IR RADIOLOGIST EVAL & MGMT 10/06/2016 [facility]  . IR GENERIC HISTORICAL  10/26/2016   IR RADIOLOGIST EVAL & MGMT 10/26/2016 [facility]  . IR GENERIC HISTORICAL  11/03/2016   IR RADIOLOGIST EVAL & MGMT 11/03/2016 WeArdis RowanPA-C [facility]  . IR GENERIC HISTORICAL  11/24/2016   IR RADIOLOGIST EVAL & MGMT 11/24/2016 WeArdis RowanPA-C [facility]  . IR  GENERIC HISTORICAL  12/05/2016   Fistula smaller/improving.  IR SINUS/FIST TUBE CHK-NON GI 12/05/2016 Sandi Mariscal, MD [facility]  . IR GENERIC HISTORICAL  12/22/2016   IR RADIOLOGIST EVAL & MGMT 12/22/2016 Greggory Keen, MD [facility]  . IR RADIOLOGIST EVAL & MGMT  01/10/2017  . IR RADIOLOGIST EVAL & MGMT  02/09/2017  . IR SINUS/FIST TUBE CHK-NON GI  03/28/2017  . LEFT HEART CATHETERIZATION WITH CORONARY ANGIOGRAM N/A 05/30/2012   Procedure: LEFT HEART CATHETERIZATION WITH CORONARY ANGIOGRAM;  Surgeon: Burnell Blanks, MD;  Location: [facility] CATH LAB;  Service: Cardiovascular: Mild, non-obstructive CAD  . TRANSTHORACIC ECHOCARDIOGRAM  09/14/2016; 11/2016   08/2016: EF 30-35 %, Akinesis of the mid-apicalanteroseptal myocardium.  Grade I DD.  Mild pulm HTN.  Repeat echo 11/2016, EF 40-45%, no rwma, nl RV fxn, mild TR, PASP 87mHg.    .Marland KitchenTRANSTHORACIC ECHOCARDIOGRAM  03/07/2017   EF 50-55%. Hypokinesis of the distal septum with overall low normal LV,  systolic function; mild diastolic dysfunction with elevated LV  filling pressure; mildly calcified aortic valve with mild AI; small pericardial effusion    Outpatient  Medications Prior to Visit  Medication Sig Dispense Refill  . albuterol (PROVENTIL HFA;VENTOLIN HFA) 108 (90 Base) MCG/ACT inhaler INHALE 2 PUFFS BY MOUTH EVERY 6 HOURS AS NEEDED FOR WHEEZING OR SHORTNESS OF BREATH 18 g 0  . albuterol (PROVENTIL) (2.5 MG/3ML) 0.083% nebulizer solution USE 1 VIAL IN NEBULIZER EVERY 4 HOURS AS NEEDED FOR WHEEZING AND FOR SHORTNESS OF BREATH 75 mL 0  . aspirin 81 MG EC tablet Take 81 mg by mouth daily.     .Marland Kitchenatorvastatin (LIPITOR) 40 MG tablet Take 1 tablet (40 mg total) by mouth daily. 30 tablet 0  . buPROPion (WELLBUTRIN XL) 150 MG 24 hr tablet Take 1 tablet by mouth once daily 30 tablet 0  . cetirizine (ZYRTEC) 10 MG tablet Take 10 mg by mouth daily.    .Marland Kitchenescitalopram (LEXAPRO) 20 MG tablet TAKE 1 TABLET BY MOUTH ONCE DAILY . APPOINTMENT REQUIRED FOR FUTURE REFILLS 30 tablet 0  . ferrous sulfate 325 (65 FE) MG EC tablet Take 325 mg by mouth 2 (two) times daily.    .Marland KitchenFLOVENT HFA 220 MCG/ACT inhaler Inhale 2 puffs by mouth twice daily 12 g 0  . ibandronate (BONIVA) 150 MG tablet Take 1 tablet (150 mg total) by mouth every 30 (thirty) days. Take in the morning with a full glass of water, on an empty stomach, and do not take anything else by mouth or lie down for the next 30 min. 3 tablet 3  . Insulin Isophane & Regular Human (HUMULIN 70/30 KWIKPEN) (70-30) 100 UNIT/ML PEN Take 4 U SQ with breakfast and 2 U SQ with supper. 15 pen 1  . Lancets (ONETOUCH DELICA PLUS [id number] MISC USE 1  TO CHECK GLUCOSE THREE TIMES DAILY 100 each 0  . Multiple Vitamins-Minerals (MULTIVITAMIN WOMEN PO) Take by mouth daily.    .Glory RosebushULTRA test strip USE 1 STRIP TO CHECK GLUCOSE THREE TIMES DAILY 100 each 0  . pantoprazole (PROTONIX) 40 MG tablet Take 1 tablet by mouth once daily 30 tablet 0  . metFORMIN (GLUCOPHAGE) 500 MG tablet TAKE 1 TABLET BY MOUTH TWICE DAILY WITH A MEAL 60 tablet 0  . metoprolol tartrate (LOPRESSOR) 25 MG tablet TAKE 1 TABLET BY MOUTH TWICE DAILY. OFFICE VISIT  NEEDED FOR FUTURE REFILLS 60 tablet 0  . potassium chloride (KLOR-CON) 10 MEQ tablet Take 1 tablet by mouth  once daily (Patient taking differently: Take 10 mEq by mouth daily. ) 90 tablet 0  . dexamethasone (DECADRON) 6 MG tablet Take 1 tablet (6 mg total) by mouth daily. (Patient not taking: Reported on 11/07/2019) 3 tablet 0   No facility-administered medications prior to visit.    Allergies  Allergen Reactions  . Lactose Intolerance (Gi) Diarrhea  . Ibuprofen Other (See Comments)    REACTION: Anxiousness, hyperventilates    ROS As per HPI  PE: Vitals with BMI 04/24/2020 02/04/2020 02/03/2020  Height _0  - -  Weight 137 lbs 6 oz - -  BMI 63.87 - -  Systolic 564 332 951  Diastolic 70 54 77  Pulse 884 88 92  O2 sat on RA today is 96% Gen: Alert, well appearing.  Patient is oriented to person, place, time, and situation. AFFECT: pleasant, lucid thought and speech. CV: RRR (100 by me), no m/r/g.   LUNGS: CTA bilat, nonlabored resps, good aeration in all lung fields. EXT: no clubbing or cyanosis.  no edema.  Foot exam -no swelling, tenderness or skin or vascular lesions. Color and temperature is normal. Sensation is intact. Peripheral pulses are palpable. Toenails are normal.     LABS:  Lab Results  Component Value Date   TSH 1.24 04/20/2017   Lab Results  Component Value Date   WBC 8.6 02/03/2020   HGB 11.7 (L) 02/03/2020   HCT 38.1 02/03/2020   MCV 92.7 02/03/2020   PLT 256 02/03/2020   Lab Results  Component Value Date   IRON 44 10/11/2019   TIBC 236 (L) 10/11/2019   FERRITIN 86 10/11/2019   Lab Results  Component Value Date   VITAMINB12 1,454 (H) 10/11/2019    Lab Results  Component Value Date   CREATININE 0.89 02/03/2020   BUN 16 02/03/2020   NA 134 (L) 02/03/2020   K 4.0 02/03/2020   CL 100 02/03/2020   CO2 25 02/03/2020   Lab Results  Component Value Date   ALT 14 02/03/2020   AST 20 02/03/2020   ALKPHOS 70 02/03/2020   BILITOT 0.6 02/03/2020    Lab Results  Component Value Date   CHOL 111 09/19/2018   Lab Results  Component Value Date   HDL 34 (L) 09/19/2018   Lab Results  Component Value Date   LDLCALC 48 09/19/2018   Lab Results  Component Value Date   TRIG 147 09/19/2018   Lab Results  Component Value Date   CHOLHDL 3.3 09/19/2018   Lab Results  Component Value Date   HGBA1C 5.1 08/29/2019    IMPRESSION AND PLAN:  1) R 5th metacarpal fx--to ortho surg today.   2) DM stable: a1c and urine microal/cr today.  Feet exam normal today. 3) HTN: bp soft, having some orthostatic dizziness.  Dec lopressor to 1/2 of 32m tab bid and monitor. 4) HLD: tolerating statin.  Not fasting today.  Hepatic panel normal 2 mo ago. 5) CRI III: avoids NSAIDs.  Fluid intake good.  Lytes/cr today. 6) Hx of IDA: investigations unrevealing.  Still taking iron bid, most recent Hb 11.7 in ED for her hip contusion 2 mo ago. CBC today.   No melena or BRBPR.  An After Visit Summary was printed and given to the patient.  FOLLOW UP: Return in about 6 months (around 10/25/2020) for annual CPE (fasting).  Signed:  PCrissie Sickles MD           04/24/2020

## 2020-04-26 ENCOUNTER — Other Ambulatory Visit: Payer: Self-pay | Admitting: Family Medicine

## 2020-04-30 ENCOUNTER — Ambulatory Visit
Admission: RE | Admit: 2020-04-30 | Discharge: 2020-04-30 | Disposition: A | Discharge status: Home / Self Care | Payer: Medicare Other | Source: Ambulatory Visit | Attending: Family Medicine | Admitting: Family Medicine

## 2020-04-30 ENCOUNTER — Other Ambulatory Visit: Payer: Self-pay

## 2020-04-30 DIAGNOSIS — Z1231 Encounter for screening mammogram for malignant neoplasm of breast: Secondary | ICD-10-CM

## 2020-05-01 DIAGNOSIS — S62306D Unspecified fracture of fifth metacarpal bone, right hand, subsequent encounter for fracture with routine healing: Secondary | ICD-10-CM | POA: Diagnosis not present

## 2020-05-09 ENCOUNTER — Other Ambulatory Visit: Payer: Self-pay | Admitting: Family Medicine

## 2020-05-18 ENCOUNTER — Other Ambulatory Visit: Payer: Self-pay | Admitting: Family Medicine

## 2020-05-25 DIAGNOSIS — S62306D Unspecified fracture of fifth metacarpal bone, right hand, subsequent encounter for fracture with routine healing: Secondary | ICD-10-CM | POA: Diagnosis not present

## 2020-05-27 ENCOUNTER — Other Ambulatory Visit: Payer: Self-pay

## 2020-05-27 MED ORDER — ATORVASTATIN CALCIUM 40 MG PO TABS: 40.0000 mg | ORAL_TABLET | Freq: Every day | ORAL | 1 refills | Status: DC

## 2020-06-05 ENCOUNTER — Encounter: Payer: Self-pay | Admitting: Family Medicine

## 2020-06-05 ENCOUNTER — Other Ambulatory Visit: Payer: Self-pay

## 2020-06-05 ENCOUNTER — Other Ambulatory Visit: Payer: Self-pay | Admitting: Family Medicine

## 2020-06-05 ENCOUNTER — Telehealth (INDEPENDENT_AMBULATORY_CARE_PROVIDER_SITE_OTHER): Payer: Medicare Other | Admitting: Family Medicine

## 2020-06-05 DIAGNOSIS — R05 Cough: Secondary | ICD-10-CM | POA: Diagnosis not present

## 2020-06-05 DIAGNOSIS — J441 Chronic obstructive pulmonary disease with (acute) exacerbation: Secondary | ICD-10-CM

## 2020-06-05 DIAGNOSIS — R059 Cough, unspecified: Secondary | ICD-10-CM

## 2020-06-05 MED ORDER — PREDNISONE 20 MG PO TABS: ORAL_TABLET | ORAL | 0 refills | Status: DC

## 2020-06-05 MED ORDER — ALBUTEROL SULFATE HFA 108 (90 BASE) MCG/ACT IN AERS: INHALATION_SPRAY | RESPIRATORY_TRACT | 1 refills | Status: DC

## 2020-06-05 NOTE — Progress Notes (Signed)
Virtual Visit via Video Note  I connected with pt on 06/05/20 at  4:15 PM EDT by a video enabled telemedicine application and verified that I am speaking with the correct person using two identifiers.  Location patient: home Location provider:work or home office Persons participating in the virtual visit: patient, provider  I discussed the limitations of evaluation and management by telemedicine and the availability of in person appointments. The patient expressed understanding and agreed to proceed.  Telemedicine visit is a necessity given the COVID-19 restrictions in place at the current time.  HPI: 74 y/o WF being seen today for cough. Her daughter Rudene Re is helping with hx today. Says it sounds very deep.  Onset yesterday, somewhat productive. Improved some today with use of robitussin DM.  Some wheezing, used albut neb this morning and it helped a lot.  NO fever.  No SOB or CP.  No dizziness or palpitations. She is eating and drinking well.  ROS: See pertinent positives and negatives per HPI.  Past Medical History:  Diagnosis Date  . Age-related nuclear cataract of both eyes 2016   +cortical age related cataracts OU   . Anxiety   . Arthritis    hands  . Bilateral diabetic retinopathy (HCC) 2015   Dr. Zigmund Daniel  . Blood transfusion without reported diagnosis    when taking chemo  . Chronic renal insufficiency, stage 3 (moderate)    GFR 50s  . 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  Marland Kitchen COPD (chronic obstructive pulmonary disease) ([place])   . CVA (cerebral vascular accident) ([place]) 09/2016   MRI did show a left-sided small ischemic stroke which was acute but likely incidental (MRI was done b/c pt had TIA sx's in hospital).  Neuro put her on plavix at that time.  Cardiology d/c'd her plavix 08/2017.  . Depression   . Diabetes mellitus with complication (HCC)    diab retinopathy OU (laser)  . GERD (gastroesophageal reflux disease)     protonix  . History of concussion 08/25/2018   w/out loss of consciousness--mild increase in baseline memory impairment after.  CT head neg acute.  Marland Kitchen History of diverticulitis of colon    with abscess; required IR percutaneous drain placed 09/2016.  Marland Kitchen Hyperlipidemia   . Hypertension   . Iron deficiency anemia    started iron 07/02/19.  EGD/colonoscopy 08/27/19 unrevealing. Capsule endoscopy to be done.  . Left bundle branch block (LBBB) 12/16/2016  . Lung cancer ([place])    non-small cell lung ca, stage III in 05/2011; systemic chemotherapy concurrent with radiation followed by prophylactic cranial irradiation and has been observation since July of 2010 with no evidence for disease recurrence-released from onc f/u 12/2014 (needs annual cxr by PCP).  CXR 08/2015 stable.  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 Earlie Server did bx and this was NEG for malignancy.  . Mild cognitive impairment with memory loss    Likely from brain radiation therapy  . Non-obstructive CAD (coronary artery disease)    a. Cath 2006 preserved LV fxn, scattered irregularities without critical stenosis; b. 2008 stress echo negative for ischemia, but with hypertensive response; c. 08/2016 Cath: D1 25%, otw nl.  . Normocytic anemia 2018   Iron, vit B12, folate all normal 12/2016.  Marland Kitchen Open-angle glaucoma 2016   Dr. Shirley Muscat, (bilateral)---responding to topical therapy  . Osteoporosis 03/2016; 03/2018   03/2016 DEXA T-score -2.2.  03/2018 T-score L femoral neck  -2.7--boniva started.  . Pneumonia    hospitalization 11/2016;  hypoxemic resp failure--d/c'd home with home oxygen therapy  . Seasonal allergic rhinitis   . Subclinical hyperthyroidism 2018  . Takotsubo cardiomyopathy    a. 08/2016 Echo: EF 30-35%, gr1DD, PASP 53mHg;  b. 08/2016 Cath: nonobs Dzs;  c. 11/2016 Echo: EF 40-45%, no rwma, nl RV fxn, mild TR, PASP 38mg.  ECHO 02/2017 EF 50-55%, grd II DD---essentially resolved Takotsubo 02/2017.  . Torsades de pointes ([place]11/[place]   a.  08/2016 in setting of diverticulitis & pneumoperitoneum and Takotsubo CM -->prolonged QT, seen by EP-->avoid meds with potential for QT prolongation.    Past Surgical History:  Procedure Laterality Date  . ABDOMINAL HYSTERECTOMY  1997  . APPENDECTOMY    . CARDIAC CATHETERIZATION N/A 09/14/2016   Procedure: Left Heart Cath and Coronary Angiography;  Surgeon: ThTroy SineMD;  [facility];  Service: Cardiovascular; Mild nonobstructive CAD with 85% smooth narrowing in the first diagonal branch of the LAD; 10% smooth narrowing of the ostial proximal left circumflex coronary artery; and a normal dominant RCA.  . Marland KitchenARPAL TUNNEL RELEASE    . CATARACT EXTRACTION Bilateral   . CHOLECYSTECTOMY  2002  . COLONOSCOPY  10/24/00; 08/27/19   2002 normal.  [employer] hemoccult testing via [facility] 06/18/15 was NEG.  2020->diverticulosis o/w nl.  . dexa  03/2016; 03/2018   03/2018 T-score L femoral neck  -2.7 (worsened compared to osteopenic range 2017.)  . ESOPHAGOGASTRODUODENOSCOPY  08/27/2019   Normal.  . IR GENERIC HISTORICAL  09/19/2016   IR SINUS/FIST TUBE CHK-NON GI 09/19/2016 JaCorrie MckusickDO [facility]  . IR GENERIC HISTORICAL  10/06/2016   IR RADIOLOGIST EVAL & MGMT 10/06/2016 [facility]  . IR GENERIC HISTORICAL  10/26/2016   IR RADIOLOGIST EVAL & MGMT 10/26/2016 [facility]  . IR GENERIC HISTORICAL  11/03/2016   IR RADIOLOGIST EVAL & MGMT 11/03/2016 WeArdis RowanPA-C [facility]  . IR GENERIC HISTORICAL  11/24/2016   IR RADIOLOGIST EVAL & MGMT 11/24/2016 WeArdis RowanPA-C [facility]  . IR GENERIC HISTORICAL  12/05/2016   Fistula smaller/improving.  IR SINUS/FIST TUBE CHK-NON GI 12/05/2016 JoSandi MariscalMD [facility]  . IR GENERIC HISTORICAL  12/22/2016   IR RADIOLOGIST EVAL & MGMT 12/22/2016 MiGreggory KeenMD [facility]  . IR RADIOLOGIST EVAL & MGMT  01/10/2017  . IR RADIOLOGIST EVAL & MGMT  02/09/2017  . IR SINUS/FIST TUBE CHK-NON GI  03/28/2017   . LEFT HEART CATHETERIZATION WITH CORONARY ANGIOGRAM N/A 05/30/2012   Procedure: LEFT HEART CATHETERIZATION WITH CORONARY ANGIOGRAM;  Surgeon: ChBurnell BlanksMD;  [facility];  Service: Cardiovascular: Mild, non-obstructive CAD  . TRANSTHORACIC ECHOCARDIOGRAM  09/14/2016; 11/2016   08/2016: EF 30-35 %, Akinesis of the mid-apicalanteroseptal myocardium.  Grade I DD.  Mild pulm HTN.  Repeat echo 11/2016, EF 40-45%, no rwma, nl RV fxn, mild TR, PASP 3727m.    . TMarland KitchenANSTHORACIC ECHOCARDIOGRAM  03/07/2017   EF 50-55%. Hypokinesis of the distal septum with overall low normal LV,  systolic function; mild diastolic dysfunction with elevated LV  filling pressure; mildly calcified aortic valve with mild AI; small pericardial effusion    Family History  Problem Relation Age of Onset  . Heart failure Mother   . Kidney disease Mother        renal failure  . Diabetes Mother   . Diabetes Father   . Diabetes Brother   . Heart disease Brother   . Heart attack  Brother   . Heart attack Brother   . Heart attack Brother   . Pancreatic cancer Brother   . Colon cancer Neg Hx   . Esophageal cancer Neg Hx   . Stomach cancer Neg Hx   . Rectal cancer Neg Hx       Current Outpatient Medications:  .  albuterol (PROVENTIL HFA;VENTOLIN HFA) 108 (90 Base) MCG/ACT inhaler, INHALE 2 PUFFS BY MOUTH EVERY 6 HOURS AS NEEDED FOR WHEEZING OR SHORTNESS OF BREATH, Disp: 18 g, Rfl: 0 .  albuterol (PROVENTIL) (2.5 MG/3ML) 0.083% nebulizer solution, USE 1 VIAL IN NEBULIZER EVERY 4 HOURS AS NEEDED FOR WHEEZING AND FOR SHORTNESS OF BREATH, Disp: 75 mL, Rfl: 0 .  aspirin 81 MG EC tablet, Take 81 mg by mouth daily. , Disp: , Rfl:  .  atorvastatin (LIPITOR) 40 MG tablet, Take 1 tablet (40 mg total) by mouth daily., Disp: 90 tablet, Rfl: 1 .  buPROPion (WELLBUTRIN XL) 150 MG 24 hr tablet, Take 1 tablet by mouth once daily, Disp: 30 tablet, Rfl: 0 .  cetirizine (ZYRTEC) 10 MG tablet, Take 10 mg by mouth daily.,  Disp: , Rfl:  .  escitalopram (LEXAPRO) 20 MG tablet, TAKE 1 TABLET BY MOUTH ONCE DAILY . APPOINTMENT REQUIRED FOR FUTURE REFILLS, Disp: 30 tablet, Rfl: 0 .  ferrous sulfate 325 (65 FE) MG EC tablet, Take 325 mg by mouth 2 (two) times daily., Disp: , Rfl:  .  FLOVENT HFA 220 MCG/ACT inhaler, Inhale 2 puffs by mouth twice daily, Disp: 12 g, Rfl: 0 .  ibandronate (BONIVA) 150 MG tablet, Take 1 tablet (150 mg total) by mouth every 30 (thirty) days. Take in the morning with a full glass of water, on an empty stomach, and do not take anything else by mouth or lie down for the next 30 min., Disp: 3 tablet, Rfl: 3 .  Insulin Isophane & Regular Human (HUMULIN 70/30 KWIKPEN) (70-30) 100 UNIT/ML PEN, Take 4 U SQ with breakfast and 2 U SQ with supper., Disp: 15 pen, Rfl: 1 .  Lancets (ONETOUCH DELICA PLUS [id number]) MISC, USE 1 LANCET TO CHECK GLUCOSE THREE TIMES DAILY, Disp: 100 each, Rfl: 0 .  metoprolol tartrate (LOPRESSOR) 25 MG tablet, 1/2 tab po bid, Disp: 90 tablet, Rfl: 3 .  Multiple Vitamins-Minerals (MULTIVITAMIN WOMEN PO), Take by mouth daily., Disp: , Rfl:  .  ONETOUCH ULTRA test strip, USE 1 STRIP TO CHECK GLUCOSE THREE TIMES DAILY, Disp: 100 each, Rfl: 0 .  pantoprazole (PROTONIX) 40 MG tablet, Take 1 tablet by mouth once daily, Disp: 30 tablet, Rfl: 0  EXAM:  VITALS per patient if applicable:  Vitals with BMI 04/24/2020 02/04/2020 02/03/2020  Height 5' 2"  - -  Weight 137 lbs 6 oz - -  BMI 50.35 - -  Systolic 465 681 275  Diastolic 70 54 77  Pulse 170 88 92      GENERAL: alert, oriented, appears well and in no acute distress  HEENT: atraumatic, conjunttiva clear, no obvious abnormalities on inspection of external nose and ears  NECK: normal movements of the head and neck  LUNGS: on inspection no signs of respiratory distress, breathing rate appears normal, no obvious gross SOB, gasping or wheezing  CV: no obvious cyanosis  MS: moves all visible extremities without noticeable  abnormality  PSYCH/NEURO: pleasant and cooperative, no obvious depression or anxiety, speech and thought processing grossly intact  ASSESSMENT AND PLAN:  Discussed the following assessment and plan:  Acute exac of COPD. Start  prednisone 39m qd x 5d. Continue albut neb q4-6 h prn. Continue robitussin dm q6h prn. Signs/symptoms to call or return for were reviewed and pt expressed understanding.  -we discussed possible serious and likely etiologies, options for evaluation and workup, limitations of telemedicine visit vs in person visit, treatment, treatment risks and precautions. Pt prefers to treat via telemedicine empirically rather then risking or undertaking an in person visit at this moment. Patient agrees to seek prompt in person care if worsening, new symptoms arise, or if is not improving with treatment.   I discussed the assessment and treatment plan with the patient. The patient was provided an opportunity to ask questions and all were answered. The patient agreed with the plan and demonstrated an understanding of the instructions.   The patient was advised to call back or seek an in-person evaluation if the symptoms worsen or if the condition fails to improve as anticipated.  F/u: if not improving approp  Signed:  PCrissie Sickles MD           06/05/2020

## 2020-06-06 ENCOUNTER — Other Ambulatory Visit: Payer: Self-pay | Admitting: Family Medicine

## 2020-06-09 ENCOUNTER — Telehealth: Payer: Self-pay | Admitting: Family Medicine

## 2020-06-09 NOTE — Telephone Encounter (Signed)
Left message for patient to schedule Annual Wellness Visit.  Please schedule with Nurse Health Advisor Martha Stanley, RN at [facility]  °

## 2020-06-24 DIAGNOSIS — S62306D Unspecified fracture of fifth metacarpal bone, right hand, subsequent encounter for fracture with routine healing: Secondary | ICD-10-CM | POA: Diagnosis not present

## 2020-07-02 ENCOUNTER — Other Ambulatory Visit: Payer: Self-pay | Admitting: Family Medicine

## 2020-07-05 ENCOUNTER — Other Ambulatory Visit: Payer: Self-pay | Admitting: Family Medicine

## 2020-07-21 ENCOUNTER — Encounter: Payer: Self-pay | Admitting: Family Medicine

## 2020-07-22 DIAGNOSIS — S62306D Unspecified fracture of fifth metacarpal bone, right hand, subsequent encounter for fracture with routine healing: Secondary | ICD-10-CM | POA: Diagnosis not present

## 2020-08-08 ENCOUNTER — Other Ambulatory Visit: Payer: Self-pay | Admitting: Family Medicine

## 2020-08-09 IMAGING — CT CT HEAD W/O CM
2 series · 15 of 40 positions shown, 18 images · non-contrast
Comparison: Head CT 08/25/2018

CLINICAL DATA: Fall.  Generalized weakness

EXAM:
CT HEAD WITHOUT CONTRAST
TECHNIQUE: Contiguous axial images were obtained from the base of the skull
through the vertex without intravenous contrast.

[Series 2: head wo · axial · 0.37mm/px · z∈[+766,+896]mm · 12 of 32 slices shown, 15 images]
[im 3/32  brain]
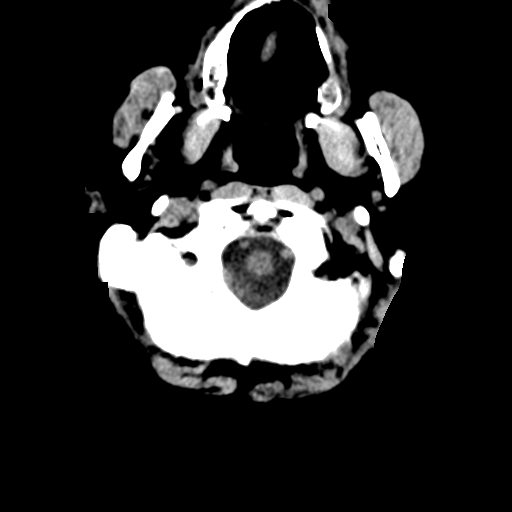
[im 3/32  bone]
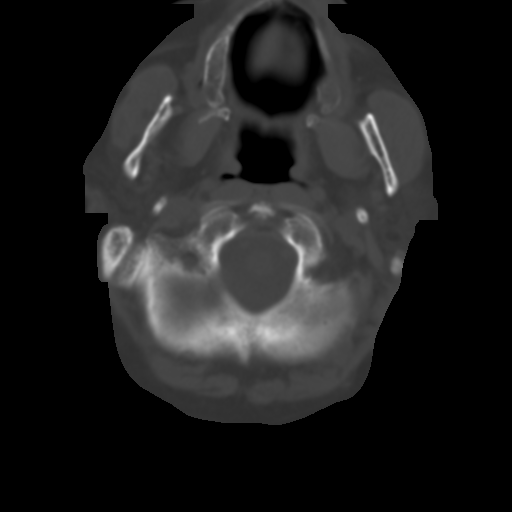
[im 5/32  brain]
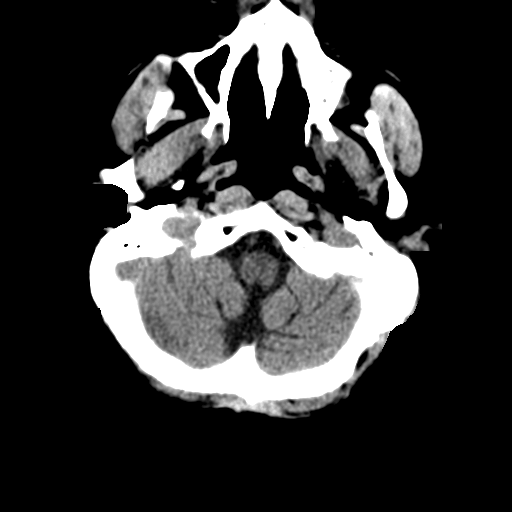
[im 7/32  brain]
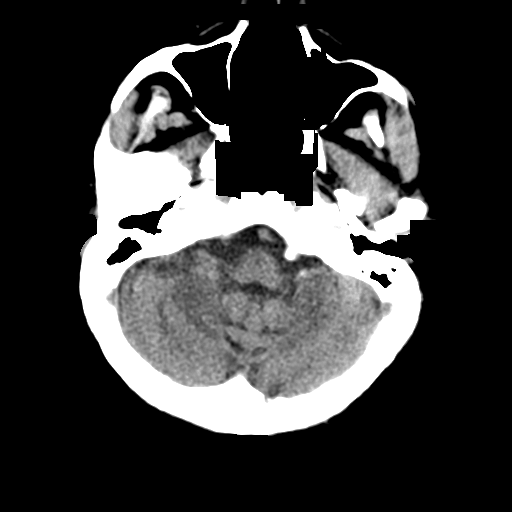
[im 10/32  brain]
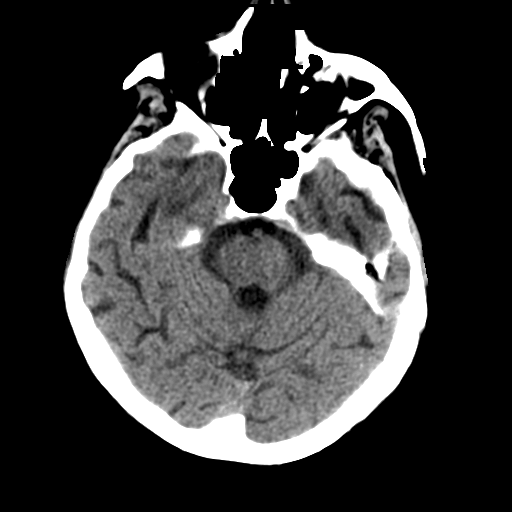
[im 12/32  brain]
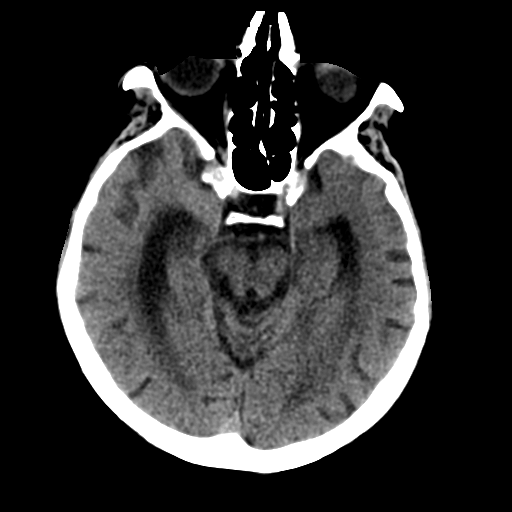
[im 12/32  bone]
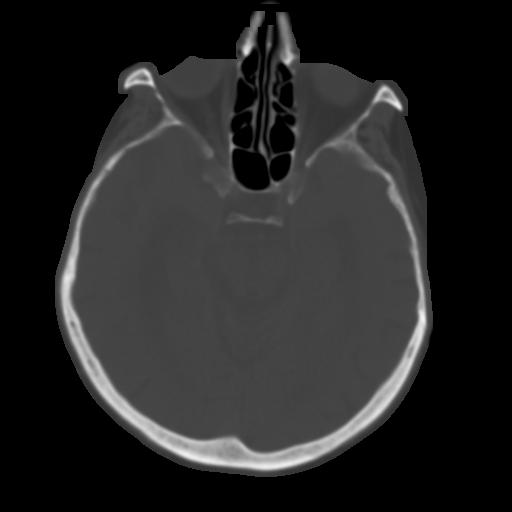
[im 14/32  brain]
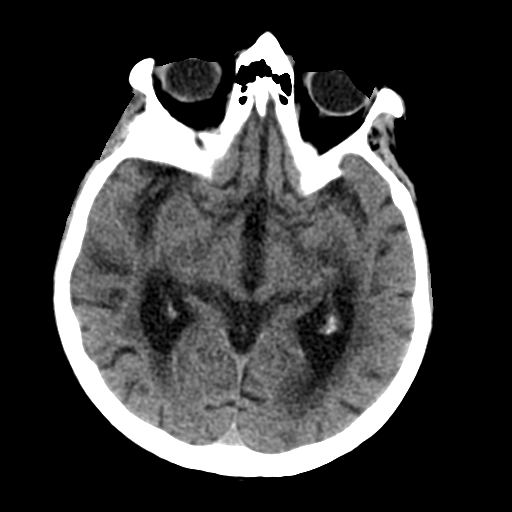
[im 18/32  brain]
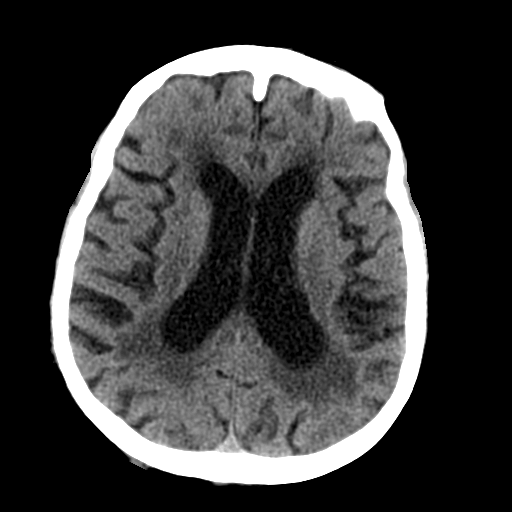
[im 20/32  brain]
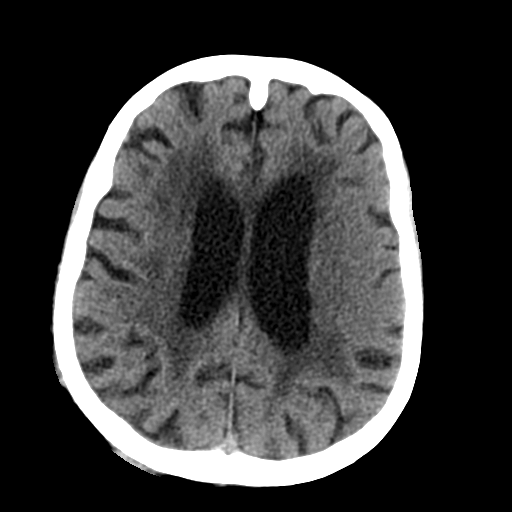
[im 22/32  brain]
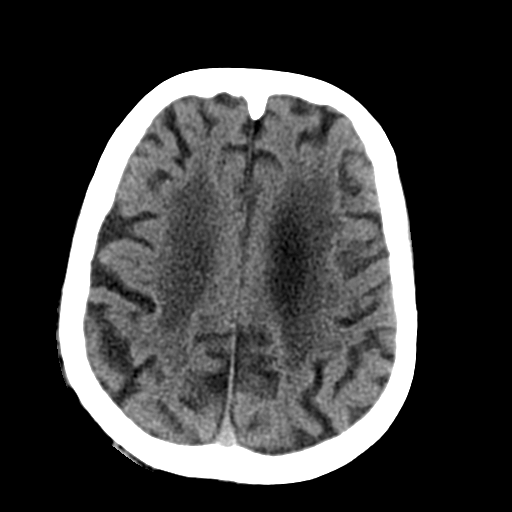
[im 22/32  bone]
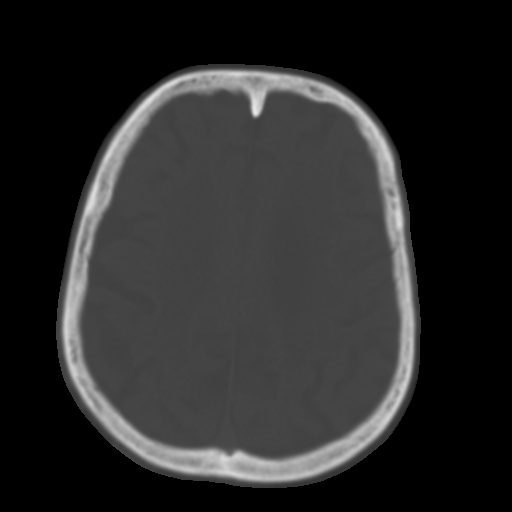
[im 25/32  brain]
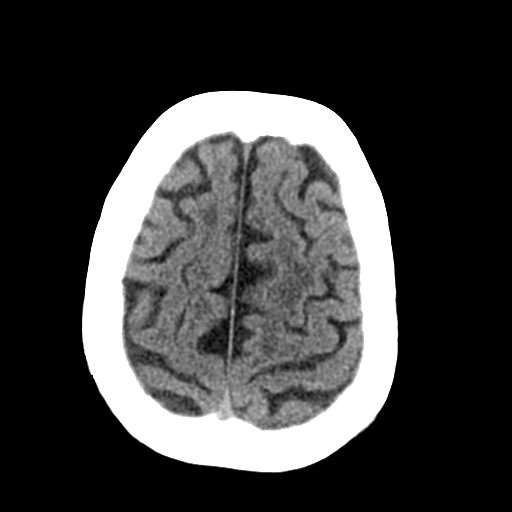
[im 27/32  brain]
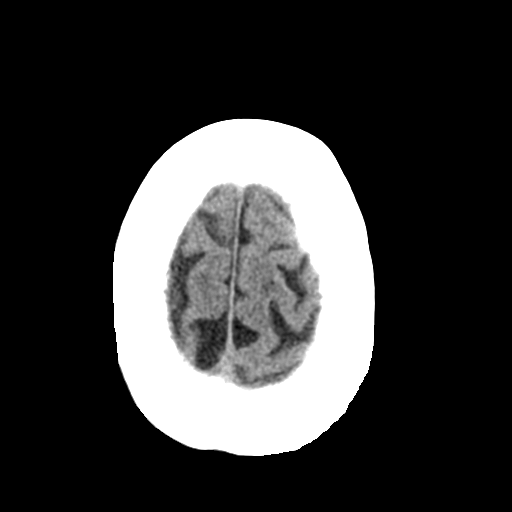
[im 29/32  brain]
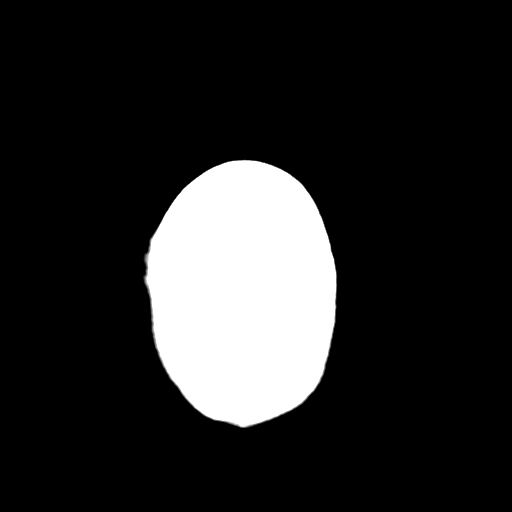

[Series 5: coronal soft tissue · coronal · 0.28mm/px · 3 of 61 slices shown]
[im 21/61  brain]
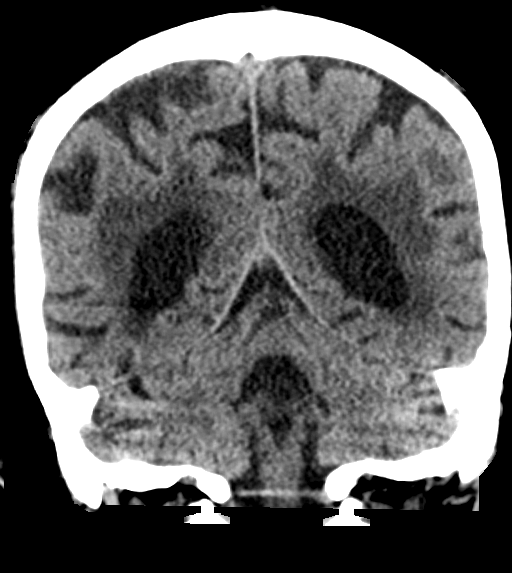
[im 27/61  brain]
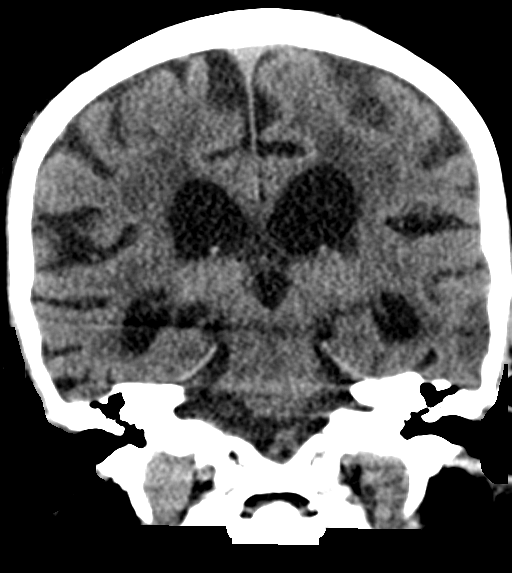
[im 34/61  brain]
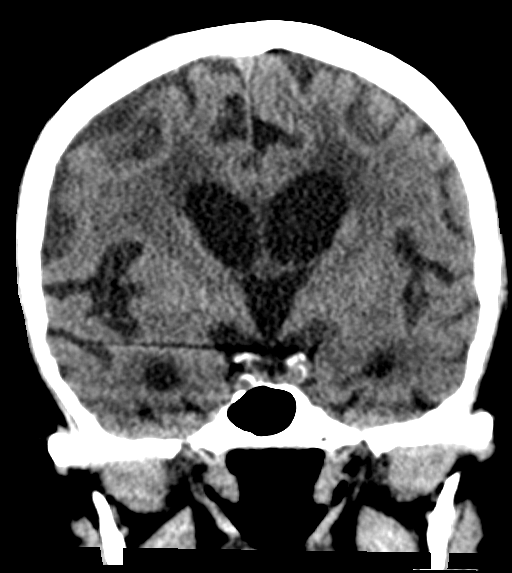

[15 of 40 positions shown; findings below may reference images not displayed]

FINDINGS: Brain: No acute intracranial hemorrhage. No focal mass lesion. No CT
evidence of acute infarction. No midline shift or mass effect. No
hydrocephalus. Basilar cisterns are patent.

There are periventricular and subcortical white matter
hypodensities. Generalized cortical atrophy.

Vascular: No hyperdense vessel or unexpected calcification.

Skull: Normal. Negative for fracture or focal lesion.

Sinuses/Orbits: Paranasal sinuses and mastoid air cells are clear.
Orbits are clear.

Other: Small scalp hematomas over the RIGHT parietal bone.
IMPRESSION: 1. No acute intracranial findings.
2. No evidence of trauma.
3. Atrophy white matter microvascular disease.
4. Several small scalp hematomas over the RIGHT parietal bone. No
skull fracture

## 2020-08-09 IMAGING — CR DG CHEST 1V PORT
1 series · 1 of 1 positions shown · non-contrast
Comparison: Eight AT 18

CLINICAL DATA: Weakness

EXAM:
PORTABLE CHEST 1 VIEW

[x chest ap]
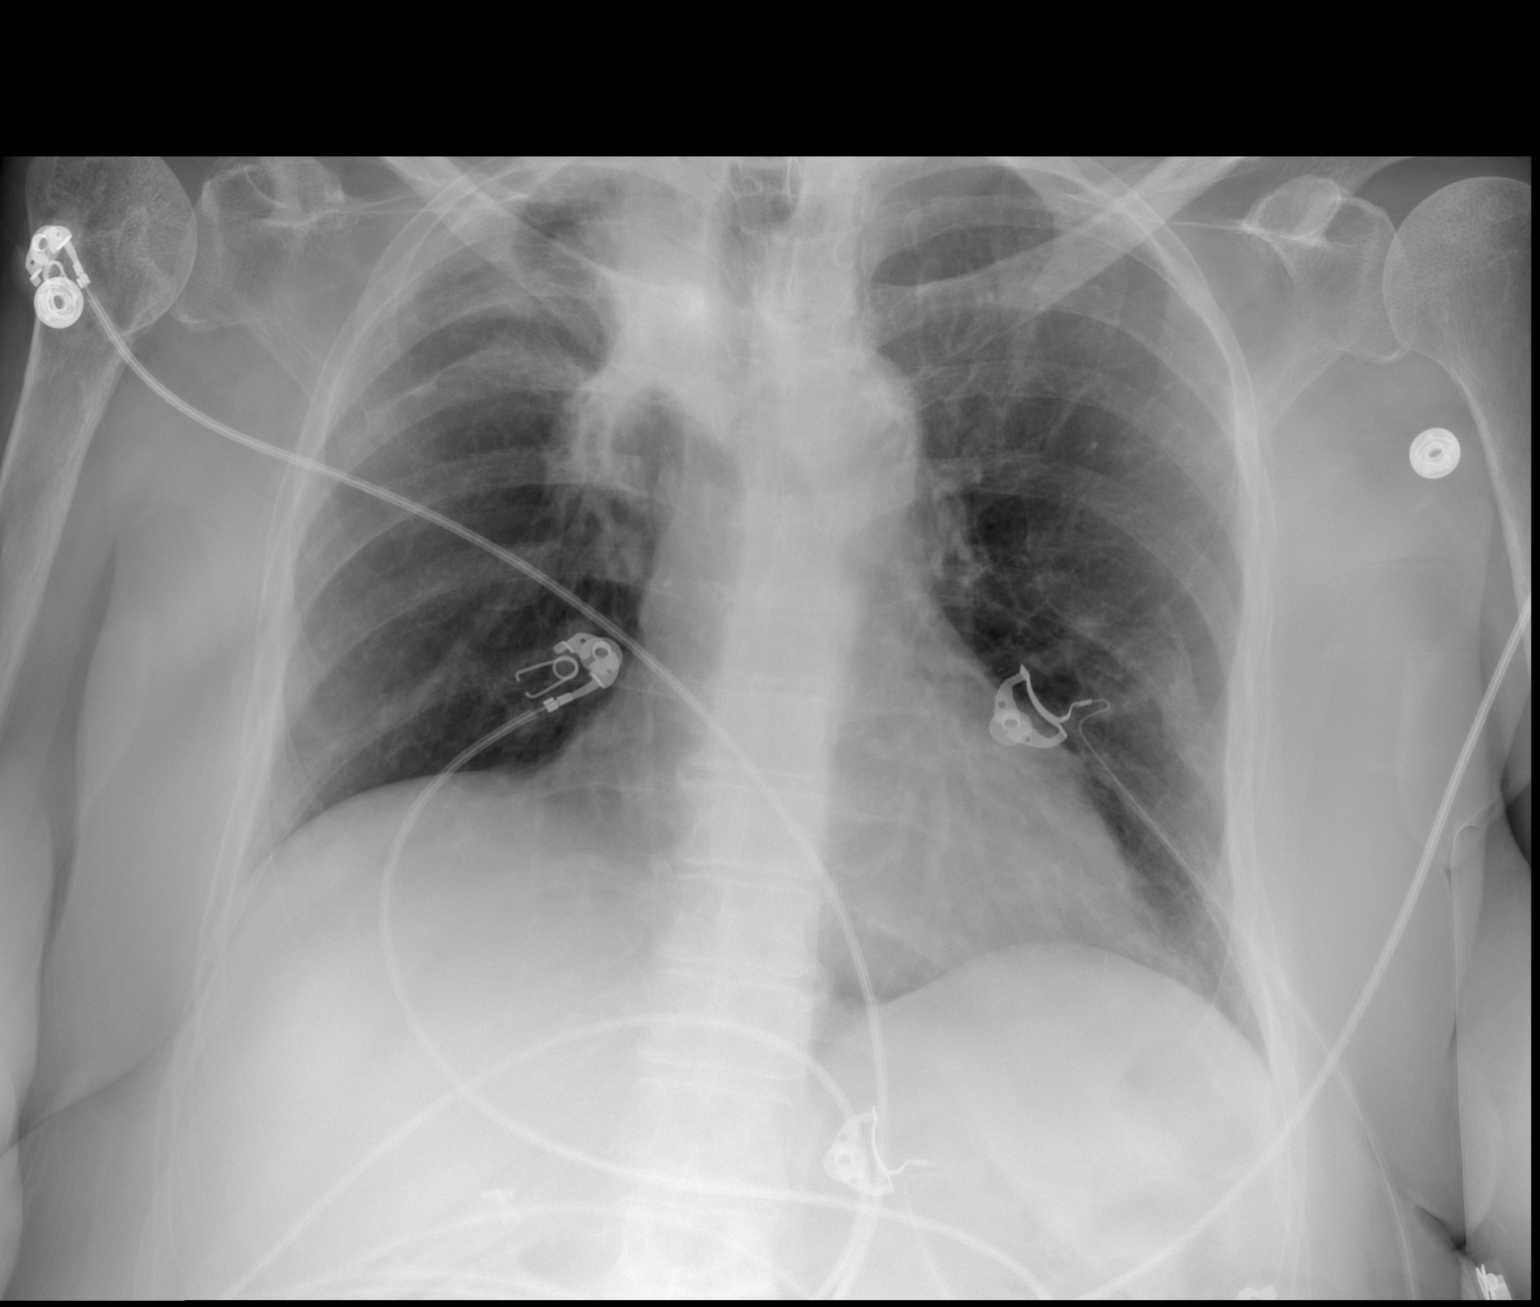

[1 of 1 positions shown; findings below may reference images not displayed]

FINDINGS: Stable soft tissue density in the right paratracheal upper lobe,
likely radiation changes. Volume loss on the right with elevation of
the right hemidiaphragm, stable. Left lung clear. Heart is normal
size. No effusions or acute bony abnormality.
IMPRESSION: No acute cardiopulmonary disease.

## 2020-08-10 IMAGING — CR DG HIP (WITH OR WITHOUT PELVIS) 1V*R*
3 series · 3 of 3 positions shown · non-contrast
Comparison: None.

CLINICAL DATA: Pain in right hip status post multiple falls.

EXAM:
DG HIP (WITH OR WITHOUT PELVIS) 1V RIGHT

[x pelvis]
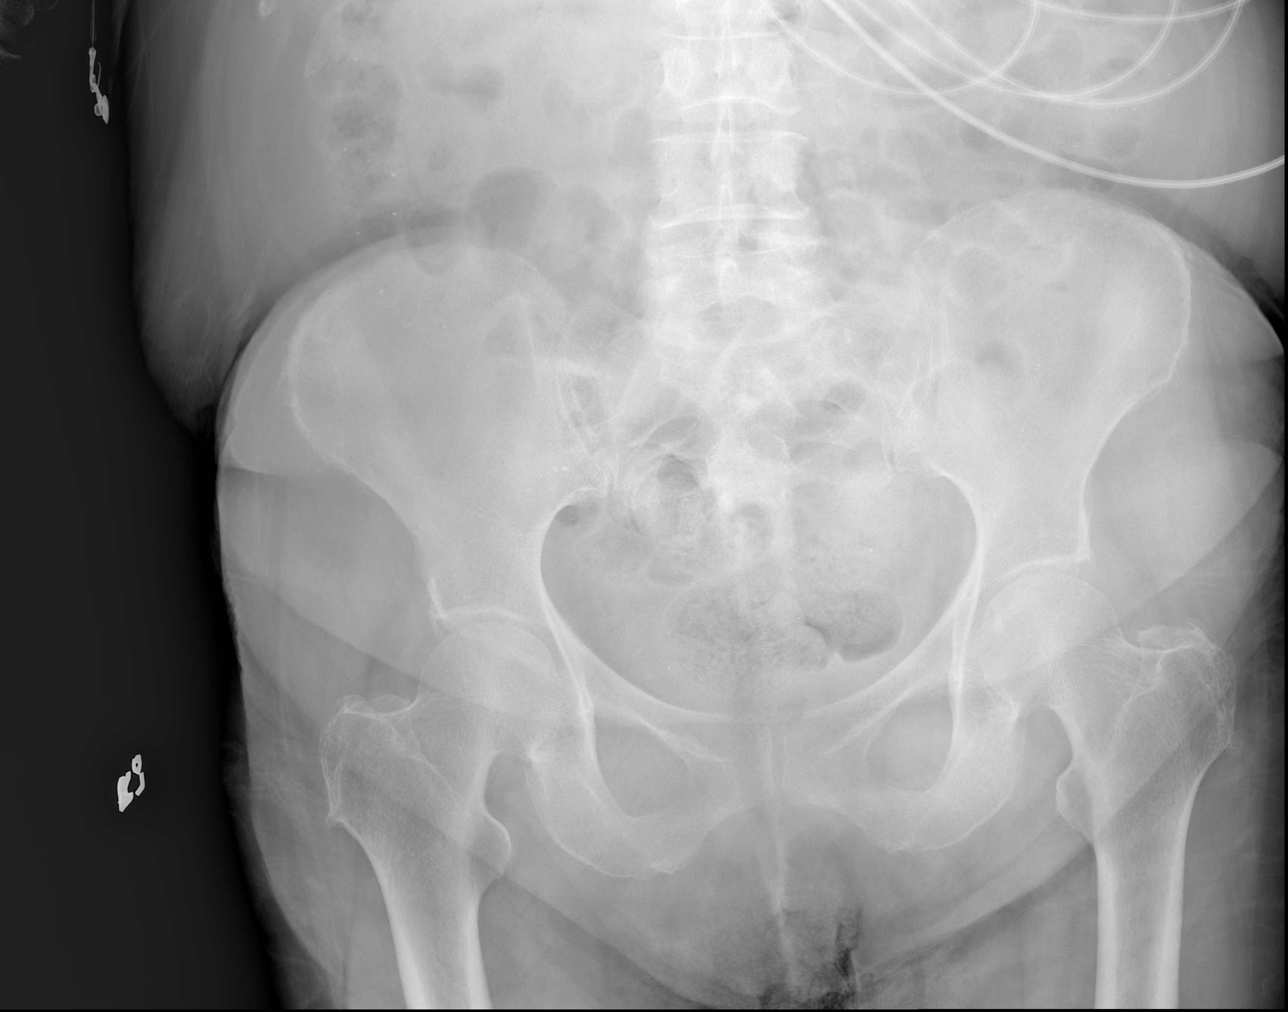

[x hip ap right (1 of 2)]
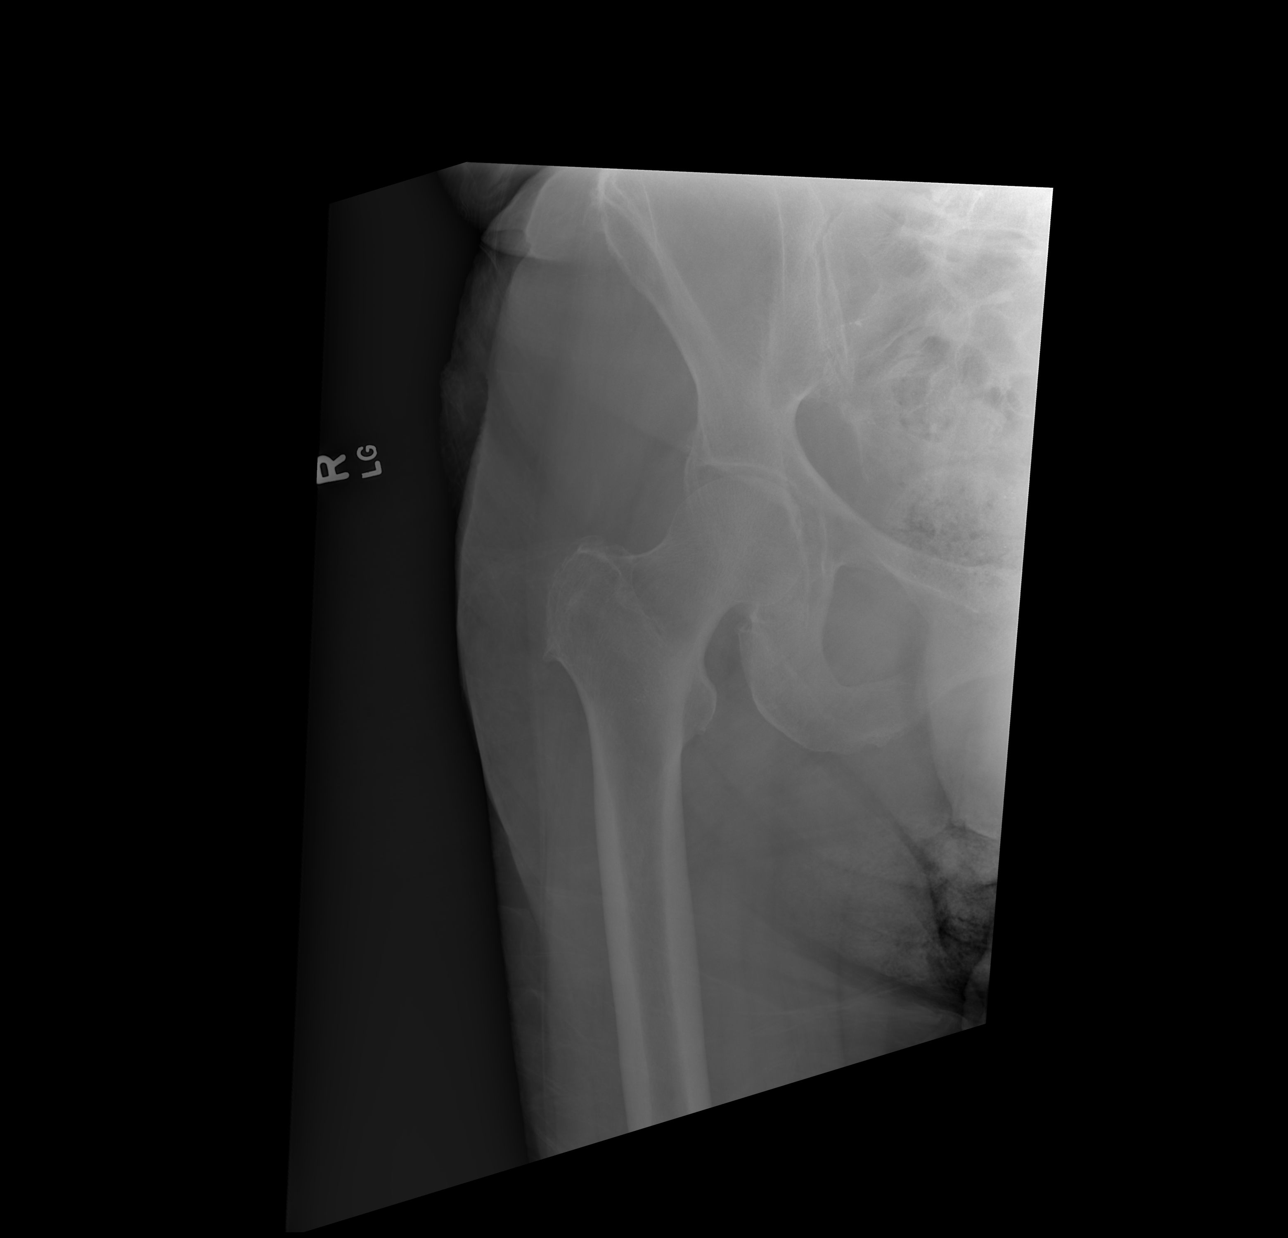

[x hip ap right (2 of 2)]
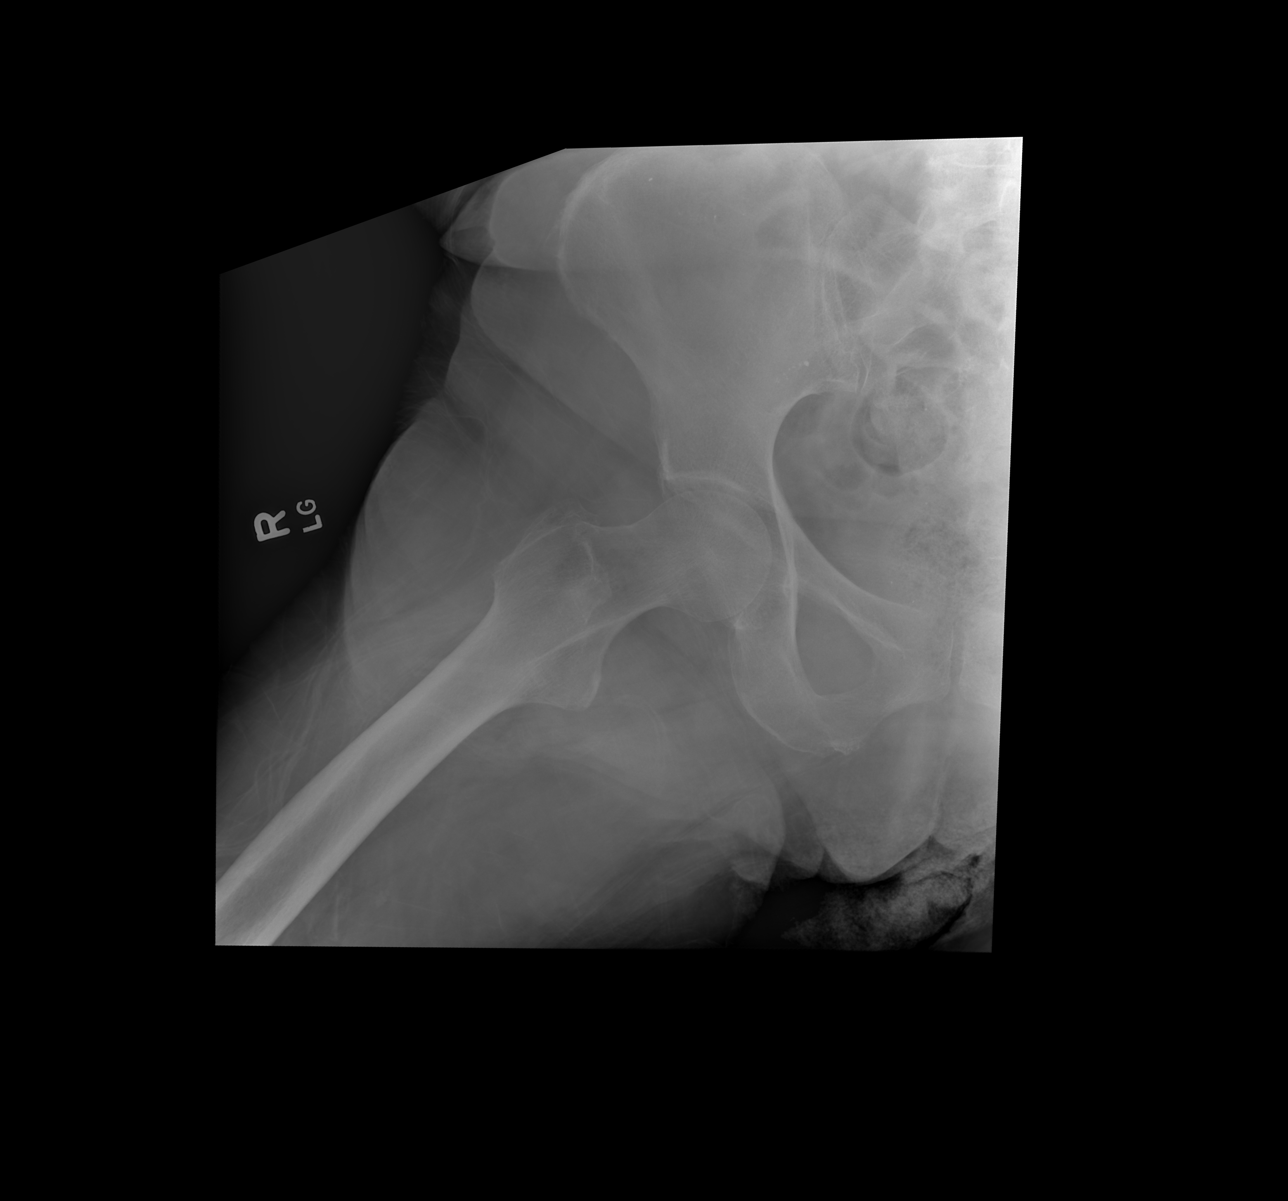

[3 of 3 positions shown; findings below may reference images not displayed]

FINDINGS: There is no evidence of hip fracture or dislocation. There is no
evidence of arthropathy or other focal bone abnormality.
IMPRESSION: Negative.

## 2020-09-24 ENCOUNTER — Encounter (INDEPENDENT_AMBULATORY_CARE_PROVIDER_SITE_OTHER): Payer: Medicare Other | Admitting: Ophthalmology

## 2020-09-24 ENCOUNTER — Other Ambulatory Visit: Payer: Self-pay

## 2020-09-24 DIAGNOSIS — I10 Essential (primary) hypertension: Secondary | ICD-10-CM

## 2020-09-24 DIAGNOSIS — E11311 Type 2 diabetes mellitus with unspecified diabetic retinopathy with macular edema: Secondary | ICD-10-CM

## 2020-09-24 DIAGNOSIS — E113512 Type 2 diabetes mellitus with proliferative diabetic retinopathy with macular edema, left eye: Secondary | ICD-10-CM | POA: Diagnosis not present

## 2020-09-24 DIAGNOSIS — H43813 Vitreous degeneration, bilateral: Secondary | ICD-10-CM

## 2020-09-24 DIAGNOSIS — H35372 Puckering of macula, left eye: Secondary | ICD-10-CM

## 2020-09-24 DIAGNOSIS — E113591 Type 2 diabetes mellitus with proliferative diabetic retinopathy without macular edema, right eye: Secondary | ICD-10-CM

## 2020-09-24 DIAGNOSIS — H35033 Hypertensive retinopathy, bilateral: Secondary | ICD-10-CM | POA: Diagnosis not present

## 2020-10-09 ENCOUNTER — Telehealth (INDEPENDENT_AMBULATORY_CARE_PROVIDER_SITE_OTHER): Payer: Medicare Other | Admitting: Family Medicine

## 2020-10-09 ENCOUNTER — Encounter: Payer: Self-pay | Admitting: Family Medicine

## 2020-10-09 DIAGNOSIS — J069 Acute upper respiratory infection, unspecified: Secondary | ICD-10-CM

## 2020-10-09 DIAGNOSIS — J441 Chronic obstructive pulmonary disease with (acute) exacerbation: Secondary | ICD-10-CM

## 2020-10-09 MED ORDER — PREDNISONE 20 MG PO TABS: ORAL_TABLET | ORAL | 0 refills | Status: DC

## 2020-10-09 MED ORDER — HYDROCODONE-HOMATROPINE 5-1.5 MG/5ML PO SYRP: ORAL_SOLUTION | ORAL | 0 refills | Status: DC

## 2020-10-09 MED ORDER — AZITHROMYCIN 250 MG PO TABS: ORAL_TABLET | ORAL | 0 refills | Status: DC

## 2020-10-09 NOTE — Progress Notes (Signed)
Virtual Visit via Video Note  I connected with pt on 10/09/20 at  4:00 PM EST by a video enabled telemedicine application and verified that I am speaking with the correct person using two identifiers.  Location patient: home, [place] Location provider:work or home office Persons participating in the virtual visit: patient, provider  I discussed the limitations of evaluation and management by telemedicine and the availability of in person appointments. The patient expressed understanding and agreed to proceed.  Telemedicine visit is a necessity given the COVID-19 restrictions in place at the current time.  HPI: 74 y/o WF with COPD being seen today for cough. Greater than 10d cough, rattling, occ wheezing, slight runny nose. Mild malaise/chills at times.  Intermittent feeling of SOB. No fever. Mucinex.  Albut neb helpful. One loose bm today.  Appetite down last few days.  ROS: no fevers, no CP, no dizziness, no HAs, no rashes, no melena/hematochezia.  No polyuria or polydipsia.  No myalgias or arthralgias.  No focal weakness, paresthesias, or tremors.  No acute vision or hearing abnormalities. No n/v or abd pain.  No palpitations.      Past Medical History:  Diagnosis Date  . Age-related nuclear cataract of both eyes 2016   +cortical age related cataracts OU   . Anxiety   . Arthritis    hands  . Bilateral diabetic retinopathy (HCC) 2015   Dr. Zigmund Daniel  . Blood transfusion without reported diagnosis    when taking chemo  . Chronic renal insufficiency, stage 3 (moderate) (HCC)    GFR 50s  . 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  Marland Kitchen COPD (chronic obstructive pulmonary disease) ([place])   . CVA (cerebral vascular accident) ([place]) 09/2016   MRI did show a left-sided small ischemic stroke which was acute but likely incidental (MRI was done b/c pt had TIA sx's in hospital).  Neuro put her on plavix at that time.  Cardiology d/c'd her plavix 08/2017.  .  Depression   . Diabetes mellitus with complication (HCC)    diab retinopathy OU (laser)  . Finger fracture, right 04/2020   R 5th metacarpal fx at the base (Dr. Mardelle Matte)  . GERD (gastroesophageal reflux disease)    protonix  . History of concussion 08/25/2018   w/out loss of consciousness--mild increase in baseline memory impairment after.  CT head neg acute.  Marland Kitchen History of diverticulitis of colon    with abscess; required IR percutaneous drain placed 09/2016.  Marland Kitchen Hyperlipidemia   . Hypertension   . Iron deficiency anemia    started iron 07/02/19.  EGD/colonoscopy 08/27/19 unrevealing. Capsule endoscopy to be done.  . Left bundle branch block (LBBB) 12/16/2016  . Lung cancer ([place])    non-small cell lung ca, stage III in 05/2011; systemic chemotherapy concurrent with radiation followed by prophylactic cranial irradiation and has been observation since July of 2010 with no evidence for disease recurrence-released from onc f/u 12/2014 (needs annual cxr by PCP).  CXR 08/2015 stable.  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 Earlie Server did bx and this was NEG for malignancy.  . Mild cognitive impairment with memory loss    Likely from brain radiation therapy  . Non-obstructive CAD (coronary artery disease)    a. Cath 2006 preserved LV fxn, scattered irregularities without critical stenosis; b. 2008 stress echo negative for ischemia, but with hypertensive response; c. 08/2016 Cath: D1 25%, otw nl.  . Normocytic anemia 2018   Iron, vit B12, folate all normal 12/2016.  Marland Kitchen  Open-angle glaucoma 2016   Dr. Shirley Muscat, (bilateral)---responding to topical therapy  . Osteoporosis 03/2016; 03/2018   03/2016 DEXA T-score -2.2.  03/2018 T-score L femoral neck  -2.7--boniva started.  . Pneumonia    hospitalization 11/2016; hypoxemic resp failure--d/c'd home with home oxygen therapy  . Seasonal allergic rhinitis   . Subclinical hyperthyroidism 2018  . Takotsubo cardiomyopathy    a. 08/2016 Echo: EF 30-35%, gr1DD, PASP 72mHg;   b. 08/2016 Cath: nonobs Dzs;  c. 11/2016 Echo: EF 40-45%, no rwma, nl RV fxn, mild TR, PASP 346mg.  ECHO 02/2017 EF 50-55%, grd II DD---essentially resolved Takotsubo 02/2017.  . Torsades de pointes ([facility]11/2017   a. 08/2016 in setting of diverticulitis & pneumoperitoneum and Takotsubo CM -->prolonged QT, seen by EP-->avoid meds with potential for QT prolongation.    Past Surgical History:  Procedure Laterality Date  . ABDOMINAL HYSTERECTOMY  1997  . APPENDECTOMY    . CARDIAC CATHETERIZATION N/A 09/14/2016   Procedure: Left Heart Cath and Coronary Angiography;  Surgeon: ThTroy SineMD;  [facility];  Service: Cardiovascular; Mild nonobstructive CAD with 85% smooth narrowing in the first diagonal branch of the LAD; 10% smooth narrowing of the ostial proximal left circumflex coronary artery; and a normal dominant RCA.  . Marland KitchenARPAL TUNNEL RELEASE    . CATARACT EXTRACTION Bilateral   . CHOLECYSTECTOMY  2002  . COLONOSCOPY  10/24/00; 08/27/19   2002 normal.  [employer] hemoccult testing via [facility] 06/18/15 was NEG.  2020->diverticulosis o/w nl.  . dexa  03/2016; 03/2018   03/2018 T-score L femoral neck  -2.7 (worsened compared to osteopenic range 2017.)  . ESOPHAGOGASTRODUODENOSCOPY  08/27/2019   Normal.  . IR GENERIC HISTORICAL  09/19/2016   IR SINUS/FIST TUBE CHK-NON GI 09/19/2016 JaCorrie MckusickDO [facility]  . IR GENERIC HISTORICAL  10/06/2016   IR RADIOLOGIST EVAL & MGMT 10/06/2016 [facility]  . IR GENERIC HISTORICAL  10/26/2016   IR RADIOLOGIST EVAL & MGMT 10/26/2016 [facility]  . IR GENERIC HISTORICAL  11/03/2016   IR RADIOLOGIST EVAL & MGMT 11/03/2016 WeArdis RowanPA-C [facility]  . IR GENERIC HISTORICAL  11/24/2016   IR RADIOLOGIST EVAL & MGMT 11/24/2016 WeArdis RowanPA-C [facility]  . IR GENERIC HISTORICAL  12/05/2016   Fistula smaller/improving.  IR SINUS/FIST TUBE CHK-NON GI 12/05/2016 JoSandi MariscalMD [facility]  . IR GENERIC  HISTORICAL  12/22/2016   IR RADIOLOGIST EVAL & MGMT 12/22/2016 MiGreggory KeenMD [facility]  . IR RADIOLOGIST EVAL & MGMT  01/10/2017  . IR RADIOLOGIST EVAL & MGMT  02/09/2017  . IR SINUS/FIST TUBE CHK-NON GI  03/28/2017  . LEFT HEART CATHETERIZATION WITH CORONARY ANGIOGRAM N/A 05/30/2012   Procedure: LEFT HEART CATHETERIZATION WITH CORONARY ANGIOGRAM;  Surgeon: ChBurnell BlanksMD;  [facility];  Service: Cardiovascular: Mild, non-obstructive CAD  . TRANSTHORACIC ECHOCARDIOGRAM  09/14/2016; 11/2016   08/2016: EF 30-35 %, Akinesis of the mid-apicalanteroseptal myocardium.  Grade I DD.  Mild pulm HTN.  Repeat echo 11/2016, EF 40-45%, no rwma, nl RV fxn, mild TR, PASP 3761m.    . TMarland KitchenANSTHORACIC ECHOCARDIOGRAM  03/07/2017   EF 50-55%. Hypokinesis of the distal septum with overall low normal LV,  systolic function; mild diastolic dysfunction with elevated LV  filling pressure; mildly calcified aortic valve with mild AI; small pericardial effusion     Current Outpatient Medications:  .  albuterol (PROVENTIL) (2.5 MG/3ML) 0.083% nebulizer solution,  USE 1 VIAL IN NEBULIZER EVERY 4 HOURS AS NEEDED FOR WHEEZING AND FOR SHORTNESS OF BREATH, Disp: 75 mL, Rfl: 0 .  albuterol (VENTOLIN HFA) 108 (90 Base) MCG/ACT inhaler, INHALE 2 PUFFS BY MOUTH EVERY 6 HOURS AS NEEDED FOR WHEEZING OR SHORTNESS OF BREATH, Disp: 18 g, Rfl: 1 .  aspirin 81 MG EC tablet, Take 81 mg by mouth daily. , Disp: , Rfl:  .  atorvastatin (LIPITOR) 40 MG tablet, Take 1 tablet (40 mg total) by mouth daily., Disp: 90 tablet, Rfl: 1 .  buPROPion (WELLBUTRIN XL) 150 MG 24 hr tablet, Take 1 tablet by mouth once daily, Disp: 90 tablet, Rfl: 1 .  cetirizine (ZYRTEC) 10 MG tablet, Take 10 mg by mouth daily., Disp: , Rfl:  .  escitalopram (LEXAPRO) 20 MG tablet, TAKE 1 TABLET BY MOUTH ONCE DAILY ., Disp: 90 tablet, Rfl: 1 .  ferrous sulfate 325 (65 FE) MG EC tablet, Take 325 mg by mouth 2 (two) times daily., Disp: , Rfl:  .  FLOVENT  HFA 220 MCG/ACT inhaler, Inhale 2 puffs by mouth twice daily, Disp: 12 g, Rfl: 0 .  ibandronate (BONIVA) 150 MG tablet, Take 1 tablet (150 mg total) by mouth every 30 (thirty) days. Take in the morning with a full glass of water, on an empty stomach, and do not take anything else by mouth or lie down for the next 30 min., Disp: 3 tablet, Rfl: 3 .  Insulin Isophane & Regular Human (HUMULIN 70/30 KWIKPEN) (70-30) 100 UNIT/ML PEN, Take 4 U SQ with breakfast and 2 U SQ with supper., Disp: 15 pen, Rfl: 1 .  Lancets (ONETOUCH DELICA PLUS [id number]) MISC, USE 1 LANCET TO CHECK GLUCOSE THREE TIMES DAILY, Disp: 100 each, Rfl: 1 .  metoprolol tartrate (LOPRESSOR) 25 MG tablet, 1/2 tab po bid, Disp: 90 tablet, Rfl: 3 .  Multiple Vitamins-Minerals (MULTIVITAMIN WOMEN PO), Take by mouth daily., Disp: , Rfl:  .  ONETOUCH ULTRA test strip, USE 1 STRIP TO CHECK GLUCOSE THREE TIMES DAILY, Disp: 100 each, Rfl: 1 .  pantoprazole (PROTONIX) 40 MG tablet, Take 1 tablet (40 mg total) by mouth daily., Disp: 90 tablet, Rfl: 1  EXAM:  VITALS per patient if applicable:  Vitals with BMI 04/24/2020 02/04/2020 02/03/2020  Height 5' 2"  - -  Weight 137 lbs 6 oz - -  BMI 66.29 - -  Systolic 476 546 503  Diastolic 70 54 77  Pulse 546 88 92     GENERAL: alert, oriented, appears well and in no acute distress  HEENT: atraumatic, conjunttiva clear, no obvious abnormalities on inspection of external nose and ears  NECK: normal movements of the head and neck  LUNGS: on inspection no signs of respiratory distress, breathing rate appears normal, no obvious gross SOB, gasping or wheezing  CV: no obvious cyanosis  MS: moves all visible extremities without noticeable abnormality  PSYCH/NEURO: pleasant and cooperative, no obvious depression or anxiety, speech and thought processing grossly intact  LABS: none today    Chemistry      Component Value Date/Time   NA 137 04/24/2020 1107   NA 141 09/19/2018 0918   NA 141  01/15/2015 0848   K 4.6 04/24/2020 1107   K 4.0 01/15/2015 0848   CL 99 04/24/2020 1107   CL 105 02/18/2013 0812   CO2 28 04/24/2020 1107   CO2 25 01/15/2015 0848   BUN 17 04/24/2020 1107   BUN 16 09/19/2018 0918   BUN 9.0 01/15/2015 0848  CREATININE 0.91 04/24/2020 1107   CREATININE 1.01 (H) 08/10/2018 1611   CREATININE 0.7 01/15/2015 0848      Component Value Date/Time   CALCIUM 9.5 04/24/2020 1107   CALCIUM 9.2 01/15/2015 0848   ALKPHOS 70 02/03/2020 2229   ALKPHOS 92 01/15/2015 0848   AST 20 02/03/2020 2229   AST 19 01/15/2015 0848   ALT 14 02/03/2020 2229   ALT 20 01/15/2015 0848   BILITOT 0.6 02/03/2020 2229   BILITOT 0.3 09/19/2018 0918   BILITOT 0.34 01/15/2015 0848     Lab Results  Component Value Date   WBC 9.6 04/24/2020   HGB 12.4 04/24/2020   HCT 38.1 04/24/2020   MCV 88.9 04/24/2020   PLT 287.0 04/24/2020   Lab Results  Component Value Date   HGBA1C 6.5 04/24/2020    ASSESSMENT AND PLAN:  Discussed the following assessment and plan:  URI with acute exacerbation of COPD. Prednisone 6m qd x 5d, then 280mqd x 5d. Cont albuterol neb q4h prn. Hycodan syrup 1-2 tsp qhs for cough-->she won't take albut neb hs b/c it makes her tremulous and impairs sleep. Cont flovent. Cont mucinex.  -we discussed possible serious and likely etiologies, options for evaluation and workup, limitations of telemedicine visit vs in person visit, treatment, treatment risks and precautions. Pt prefers to treat via telemedicine empirically rather than in person at this moment.    I discussed the assessment and treatment plan with the patient. The patient was provided an opportunity to ask questions and all were answered. The patient agreed with the plan and demonstrated an understanding of the instructions.   F/u: 4-5d if not signif improved  Signed:  PhCrissie SicklesMD           10/09/2020

## 2020-10-18 ENCOUNTER — Other Ambulatory Visit: Payer: Self-pay | Admitting: Family Medicine

## 2020-10-28 ENCOUNTER — Ambulatory Visit (INDEPENDENT_AMBULATORY_CARE_PROVIDER_SITE_OTHER): Payer: Medicare Other | Admitting: Family Medicine

## 2020-10-28 ENCOUNTER — Other Ambulatory Visit: Payer: Self-pay

## 2020-10-28 ENCOUNTER — Ambulatory Visit (HOSPITAL_BASED_OUTPATIENT_CLINIC_OR_DEPARTMENT_OTHER)
Admission: RE | Admit: 2020-10-28 | Discharge: 2020-10-28 | Disposition: A | Discharge status: Home / Self Care | Payer: Medicare Other | Source: Ambulatory Visit | Attending: Family Medicine | Admitting: Family Medicine

## 2020-10-28 ENCOUNTER — Encounter: Payer: Self-pay | Admitting: Family Medicine

## 2020-10-28 VITALS — BP 97/61 | HR 91 | Temp 97.5°F | Resp 16 | Ht 62.0 in | Wt 136.0 lb

## 2020-10-28 DIAGNOSIS — E7800 Pure hypercholesterolemia, unspecified: Secondary | ICD-10-CM | POA: Diagnosis not present

## 2020-10-28 DIAGNOSIS — Z23 Encounter for immunization: Secondary | ICD-10-CM | POA: Diagnosis not present

## 2020-10-28 DIAGNOSIS — N183 Chronic kidney disease, stage 3 unspecified: Secondary | ICD-10-CM

## 2020-10-28 DIAGNOSIS — Z0000 Encounter for general adult medical examination without abnormal findings: Secondary | ICD-10-CM | POA: Diagnosis not present

## 2020-10-28 DIAGNOSIS — M79652 Pain in left thigh: Secondary | ICD-10-CM | POA: Diagnosis not present

## 2020-10-28 DIAGNOSIS — E119 Type 2 diabetes mellitus without complications: Secondary | ICD-10-CM

## 2020-10-28 DIAGNOSIS — E2839 Other primary ovarian failure: Secondary | ICD-10-CM

## 2020-10-28 DIAGNOSIS — Z862 Personal history of diseases of the blood and blood-forming organs and certain disorders involving the immune mechanism: Secondary | ICD-10-CM | POA: Diagnosis not present

## 2020-10-28 DIAGNOSIS — M810 Age-related osteoporosis without current pathological fracture: Secondary | ICD-10-CM

## 2020-10-28 DIAGNOSIS — M79605 Pain in left leg: Secondary | ICD-10-CM | POA: Diagnosis not present

## 2020-10-28 LAB — CBC WITH DIFFERENTIAL/PLATELET
Basophils Absolute: 0 10*3/uL (ref 0.0–0.1)
Basophils Relative: 0.4 % (ref 0.0–3.0)
Eosinophils Absolute: 0.2 10*3/uL (ref 0.0–0.7)
Eosinophils Relative: 2.6 % (ref 0.0–5.0)
HCT: 39.9 % (ref 36.0–46.0)
Hemoglobin: 13.1 g/dL (ref 12.0–15.0)
Lymphocytes Relative: 17.5 % (ref 12.0–46.0)
Lymphs Abs: 1 10*3/uL (ref 0.7–4.0)
MCHC: 32.7 g/dL (ref 30.0–36.0)
MCV: 90 fl (ref 78.0–100.0)
Monocytes Absolute: 0.8 10*3/uL (ref 0.1–1.0)
Monocytes Relative: 13.1 % — ABNORMAL HIGH (ref 3.0–12.0)
Neutro Abs: 3.9 10*3/uL (ref 1.4–7.7)
Neutrophils Relative %: 66.4 % (ref 43.0–77.0)
Platelets: 191 10*3/uL (ref 150.0–400.0)
RBC: 4.44 Mil/uL (ref 3.87–5.11)
RDW: 16.2 % — ABNORMAL HIGH (ref 11.5–15.5)
WBC: 5.9 10*3/uL (ref 4.0–10.5)

## 2020-10-28 LAB — COMPREHENSIVE METABOLIC PANEL
ALT: 17 U/L (ref 0–35)
AST: 19 U/L (ref 0–37)
Albumin: 3.8 g/dL (ref 3.5–5.2)
Alkaline Phosphatase: 68 U/L (ref 39–117)
BUN: 16 mg/dL (ref 6–23)
CO2: 31 mEq/L (ref 19–32)
Calcium: 8.5 mg/dL (ref 8.4–10.5)
Chloride: 100 mEq/L (ref 96–112)
Creatinine, Ser: 0.93 mg/dL (ref 0.40–1.20)
GFR: 61.47 mL/min (ref >60.00)
Glucose, Bld: 105 mg/dL — ABNORMAL HIGH (ref 70–99)
Potassium: 4.3 mEq/L (ref 3.5–5.1)
Sodium: 137 mEq/L (ref 135–145)
Total Bilirubin: 0.3 mg/dL (ref 0.2–1.2)
Total Protein: 6.4 g/dL (ref 6.0–8.3)

## 2020-10-28 LAB — LIPID PANEL
Cholesterol: 130 mg/dL (ref 0–200)
HDL: 43 mg/dL (ref >39.00)
LDL Cholesterol: 71 mg/dL (ref 0–99)
NonHDL: 86.99
Total CHOL/HDL Ratio: 3
Triglycerides: 79 mg/dL (ref 0.0–149.0)
VLDL: 15.8 mg/dL (ref 0.0–40.0)

## 2020-10-28 LAB — HEMOGLOBIN A1C: Hgb A1c MFr Bld: 7.1 % — ABNORMAL HIGH (ref 4.6–6.5)

## 2020-10-28 MED ORDER — FLOVENT HFA 220 MCG/ACT IN AERO: 2.0000 | INHALATION_SPRAY | Freq: Two times a day (BID) | RESPIRATORY_TRACT | 11 refills | Status: DC

## 2020-10-28 NOTE — Patient Instructions (Signed)
Take your pantoprazole pill every other day for 10 doses and then stop. You can continue to give this med on an "as needed" basis after that (for heartburn).

## 2020-10-28 NOTE — Progress Notes (Signed)
Office Note 10/28/2020  CC:  Chief Complaint  Patient presents with  . Annual Exam    Pt is fasting.    HPI:  [name] is a 75 y.o. White female who is here accompanied by her son [name] for annual health maintenance exam and f/u DM, HTN, HLD, CRI III.  Acute exac copd, I saw her 10/09/20 and rx'd prednisone and hycodan. Some residual cough and wheeze, taking albut neb bid, some otc cough med.  Overall much better.  No f/c or sob.  Has not had her flovent lately, needs RF.  Having some L  thigh pains more the last few months.  She fell on this hip 01/2020 and x-rays were normal. Says hurts with walking, moderate intensity at the worst, goes away when sits/rests. No pain in bed, no sleep impairment.  DM: insulin 70/30, 4 U qAM and 2 U qPM Glucs 130-140 usually, nothing lower than 90.  Some up into 200s when diet not as good.  CRI: no NSAIDs.  Drinks plenty of water.  Lots of coffee.  No home bp measurements.  She ambulates w/out a walker around the house.  Has walker and WC for prn use.   Past Medical History:  Diagnosis Date  . Age-related nuclear cataract of both eyes 2016   +cortical age related cataracts OU   . Anxiety   . Arthritis    hands  . Bilateral diabetic retinopathy (HCC) 2015   Dr. Zigmund Daniel  . Blood transfusion without reported diagnosis    when taking chemo  . Chronic renal insufficiency, stage 3 (moderate) (HCC)    GFR 50s  . 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  Marland Kitchen COPD (chronic obstructive pulmonary disease) ([place])   . CVA (cerebral vascular accident) ([place]) 09/2016   MRI did show a left-sided small ischemic stroke which was acute but likely incidental (MRI was done b/c pt had TIA sx's in hospital).  Neuro put her on plavix at that time.  Cardiology d/c'd her plavix 08/2017.  . Depression   . Diabetes mellitus with complication (HCC)    diab retinopathy OU (laser)  . Finger fracture, right 04/2020    R 5th metacarpal fx at the base (Dr. Mardelle Matte)  . GERD (gastroesophageal reflux disease)    protonix  . History of concussion 08/25/2018   w/out loss of consciousness--mild increase in baseline memory impairment after.  CT head neg acute.  Marland Kitchen History of diverticulitis of colon    with abscess; required IR percutaneous drain placed 09/2016.  Marland Kitchen Hyperlipidemia   . Hypertension   . Iron deficiency anemia    started iron 07/02/19.  EGD/colonoscopy 08/27/19 unrevealing. Capsule endoscopy to be done.  . Left bundle branch block (LBBB) 12/16/2016  . Lung cancer ([place])    non-small cell lung ca, stage III in 05/2011; systemic chemotherapy concurrent with radiation followed by prophylactic cranial irradiation and has been observation since July of 2010 with no evidence for disease recurrence-released from onc f/u 12/2014 (needs annual cxr by PCP).  CXR 08/2015 stable.  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 Earlie Server did bx and this was NEG for malignancy.  . Mild cognitive impairment with memory loss    Likely from brain radiation therapy  . Non-obstructive CAD (coronary artery disease)    a. Cath 2006 preserved LV fxn, scattered irregularities without critical stenosis; b. 2008 stress echo negative for ischemia, but with hypertensive response; c. 08/2016 Cath: D1 25%, otw nl.  Marland Kitchen  Normocytic anemia 2018   Iron, vit B12, folate all normal 12/2016.  Marland Kitchen Open-angle glaucoma 2016   Dr. Shirley Muscat, (bilateral)---responding to topical therapy  . Osteoporosis 03/2016; 03/2018   03/2016 DEXA T-score -2.2.  03/2018 T-score L femoral neck  -2.7--boniva started.  . Pneumonia    hospitalization 11/2016; hypoxemic resp failure--d/c'd home with home oxygen therapy  . Seasonal allergic rhinitis   . Subclinical hyperthyroidism 2018  . Takotsubo cardiomyopathy    a. 08/2016 Echo: EF 30-35%, gr1DD, PASP 63mHg;  b. 08/2016 Cath: nonobs Dzs;  c. 11/2016 Echo: EF 40-45%, no rwma, nl RV fxn, mild TR, PASP 355mg.  ECHO 02/2017 EF 50-55%, grd  II DD---essentially resolved Takotsubo 02/2017.  . Torsades de pointes ([place]dick11/2017   a. 08/2016 in setting of diverticulitis & pneumoperitoneum and Takotsubo CM -->prolonged QT, seen by EP-->avoid meds with potential for QT prolongation.    Past Surgical History:  Procedure Laterality Date  . ABDOMINAL HYSTERECTOMY  1997  . APPENDECTOMY    . CARDIAC CATHETERIZATION N/A 09/14/2016   Procedure: Left Heart Cath and Coronary Angiography;  Surgeon: ThTroy SineMD;  [facility];  Service: Cardiovascular; Mild nonobstructive CAD with 85% smooth narrowing in the first diagonal branch of the LAD; 10% smooth narrowing of the ostial proximal left circumflex coronary artery; and a normal dominant RCA.  . Marland KitchenARPAL TUNNEL RELEASE    . CATARACT EXTRACTION Bilateral   . CHOLECYSTECTOMY  2002  . COLONOSCOPY  10/24/00; 08/27/19   2002 normal.  [employer] hemoccult testing via [facility] 06/18/15 was NEG.  2020->diverticulosis o/w nl.  . dexa  03/2016; 03/2018   03/2018 T-score L femoral neck  -2.7 (worsened compared to osteopenic range 2017.)  . ESOPHAGOGASTRODUODENOSCOPY  08/27/2019   Normal.  . IR GENERIC HISTORICAL  09/19/2016   IR SINUS/FIST TUBE CHK-NON GI 09/19/2016 JaCorrie MckusickDO [facility]  . IR GENERIC HISTORICAL  10/06/2016   IR RADIOLOGIST EVAL & MGMT 10/06/2016 [facility]  . IR GENERIC HISTORICAL  10/26/2016   IR RADIOLOGIST EVAL & MGMT 10/26/2016 [facility]  . IR GENERIC HISTORICAL  11/03/2016   IR RADIOLOGIST EVAL & MGMT 11/03/2016 WeArdis RowanPA-C [facility]  . IR GENERIC HISTORICAL  11/24/2016   IR RADIOLOGIST EVAL & MGMT 11/24/2016 WeArdis RowanPA-C [facility]  . IR GENERIC HISTORICAL  12/05/2016   Fistula smaller/improving.  IR SINUS/FIST TUBE CHK-NON GI 12/05/2016 JoSandi MariscalMD [facility]  . IR GENERIC HISTORICAL  12/22/2016   IR RADIOLOGIST EVAL & MGMT 12/22/2016 MiGreggory KeenMD [facility]  . IR RADIOLOGIST EVAL & MGMT   01/10/2017  . IR RADIOLOGIST EVAL & MGMT  02/09/2017  . IR SINUS/FIST TUBE CHK-NON GI  03/28/2017  . LEFT HEART CATHETERIZATION WITH CORONARY ANGIOGRAM N/A 05/30/2012   Procedure: LEFT HEART CATHETERIZATION WITH CORONARY ANGIOGRAM;  Surgeon: ChBurnell BlanksMD;  [facility];  Service: Cardiovascular: Mild, non-obstructive CAD  . TRANSTHORACIC ECHOCARDIOGRAM  09/14/2016; 11/2016   08/2016: EF 30-35 %, Akinesis of the mid-apicalanteroseptal myocardium.  Grade I DD.  Mild pulm HTN.  Repeat echo 11/2016, EF 40-45%, no rwma, nl RV fxn, mild TR, PASP 3740m.    . TMarland KitchenANSTHORACIC ECHOCARDIOGRAM  03/07/2017   EF 50-55%. Hypokinesis of the distal septum with overall low normal LV,  systolic function; mild diastolic dysfunction with elevated LV  filling pressure; mildly calcified aortic valve with mild AI; small pericardial effusion  Family History  Problem Relation Age of Onset  . Heart failure Mother   . Kidney disease Mother        renal failure  . Diabetes Mother   . Diabetes Father   . Diabetes Brother   . Heart disease Brother   . Heart attack Brother   . Heart attack Brother   . Heart attack Brother   . Pancreatic cancer Brother   . Colon cancer Neg Hx   . Esophageal cancer Neg Hx   . Stomach cancer Neg Hx   . Rectal cancer Neg Hx     Social History   Socioeconomic History  . Marital status: Widowed    Spouse name: Not on file  . Number of children: Not on file  . Years of education: Not on file  . Highest education level: Not on file  Occupational History  . Occupation: Retired Public affairs consultant: UNEMPLOYED  Tobacco Use  . Smoking status: Former Smoker    Packs/day: 1.00    Years: 45.00    Pack years: 45.00    Types: Cigarettes    Quit date: 10/24/2008    Years since quitting: 12.0  . Smokeless tobacco: Never Used  Vaping Use  . Vaping Use: Never used  Substance and Sexual Activity  . Alcohol use: No    Alcohol/week: 0.0 standard drinks  . Drug  use: No  . Sexual activity: Not on file  Other Topics Concern  . Not on file  Social History Narrative   Widow, lives in [place] with her daughter [name].   Husband died 07/30/2019 (esoph cancer, copd, chf).   No alcohol.   Social Determinants of Health   Financial Resource Strain: Not on file  Food Insecurity: Not on file  Transportation Needs: Not on file  Physical Activity: Not on file  Stress: Not on file  Social Connections: Not on file  Intimate Partner Violence: Not on file    Outpatient Medications Prior to Visit  Medication Sig Dispense Refill  . albuterol (PROVENTIL) (2.5 MG/3ML) 0.083% nebulizer solution USE 1 VIAL IN NEBULIZER EVERY 4 HOURS AS NEEDED FOR WHEEZING AND FOR SHORTNESS OF BREATH 75 mL 0  . albuterol (VENTOLIN HFA) 108 (90 Base) MCG/ACT inhaler INHALE 2 PUFFS BY MOUTH EVERY 6 HOURS AS NEEDED FOR WHEEZING OR SHORTNESS OF BREATH 18 g 1  . aspirin 81 MG EC tablet Take 81 mg by mouth daily.     Marland Kitchen atorvastatin (LIPITOR) 40 MG tablet Take 1 tablet (40 mg total) by mouth daily. 90 tablet 1  . buPROPion (WELLBUTRIN XL) 150 MG 24 hr tablet Take 1 tablet by mouth once daily 90 tablet 1  . cetirizine (ZYRTEC) 10 MG tablet Take 10 mg by mouth daily.    Marland Kitchen escitalopram (LEXAPRO) 20 MG tablet TAKE 1 TABLET BY MOUTH ONCE DAILY . 90 tablet 1  . ferrous sulfate 325 (65 FE) MG EC tablet Take 325 mg by mouth 2 (two) times daily.    Marland Kitchen ibandronate (BONIVA) 150 MG tablet Take 1 tablet (150 mg total) by mouth every 30 (thirty) days. Take in the morning with a full glass of water, on an empty stomach, and do not take anything else by mouth or lie down for the next 30 min. 3 tablet 3  . Insulin Isophane & Regular Human (HUMULIN 70/30 KWIKPEN) (70-30) 100 UNIT/ML PEN Take 4 U SQ with breakfast and 2 U SQ with supper. 15 pen 1  . Lancets (  ONETOUCH DELICA PLUS [id number]) MISC USE 1  TO CHECK GLUCOSE THREE TIMES DAILY 100 each 0  . metoprolol tartrate (LOPRESSOR) 25 MG tablet 1/2 tab po  bid 90 tablet 3  . Multiple Vitamins-Minerals (MULTIVITAMIN WOMEN PO) Take by mouth daily.    Glory Rosebush ULTRA test strip USE 1 STRIP TO CHECK GLUCOSE THREE TIMES DAILY 100 each 0  . pantoprazole (PROTONIX) 40 MG tablet Take 1 tablet (40 mg total) by mouth daily. 90 tablet 1  . azithromycin (ZITHROMAX) 250 MG tablet 2 tabs po qd x 1d, then 1 tab po qd x 4d (Patient not taking: Reported on 10/28/2020) 6 each 0  . FLOVENT HFA 220 MCG/ACT inhaler Inhale 2 puffs by mouth twice daily (Patient not taking: Reported on 10/28/2020) 12 g 0  . HYDROcodone-homatropine (HYCODAN) 5-1.5 MG/5ML syrup 1-2 tsp po qhs prn cough (Patient not taking: Reported on 10/28/2020) 120 mL 0  . predniSONE (DELTASONE) 20 MG tablet 2 tabs po qd x 5d, then 1 tab po qd x 5d (Patient not taking: Reported on 10/28/2020) 15 tablet 0   No facility-administered medications prior to visit.    Allergies  Allergen Reactions  . Lactose Intolerance (Gi) Diarrhea  . Ibuprofen Other (See Comments)    REACTION: Anxiousness, hyperventilates    ROS Review of Systems  Constitutional: Negative for appetite change, chills, fatigue and fever.  HENT: Negative for congestion, dental problem, ear pain and sore throat.   Eyes: Negative for discharge, redness and visual disturbance.  Respiratory: Positive for cough. Negative for chest tightness, shortness of breath and wheezing.   Cardiovascular: Negative for chest pain, palpitations and leg swelling.  Gastrointestinal: Negative for abdominal pain, blood in stool, diarrhea, nausea and vomiting.  Genitourinary: Negative for difficulty urinating, dysuria, flank pain, frequency, hematuria and urgency.  Musculoskeletal: Negative for arthralgias, back pain, joint swelling, myalgias and neck stiffness.  Skin: Negative for pallor and rash.  Neurological: Negative for dizziness, speech difficulty, weakness and headaches.  Hematological: Negative for adenopathy. Does not bruise/bleed easily.   Psychiatric/Behavioral: Negative for confusion and sleep disturbance. The patient is not nervous/anxious.    PE; Vitals with BMI 10/28/2020 04/24/2020 02/04/2020  Height _0  _1  -  Weight 136 lbs 137 lbs 6 oz -  BMI 79.02 40.97 -  Systolic 97 353 299  Diastolic 61 70 54  Pulse 91 107 88     Gen: Alert, well appearing.  Patient is oriented to person, place, time, and situation. Able to get on/off exam table with balance assistance. AFFECT: pleasant, lucid thought and speech. ENT: Ears: EACs clear, normal epithelium.  TMs with good light reflex and landmarks bilaterally.  Eyes: no injection, icteris, swelling, or exudate.  EOMI, PERRLA. Nose: no drainage or turbinate edema/swelling.  No injection or focal lesion.  Mouth: lips without lesion/swelling.  Oral mucosa pink and moist.  Dentition intact and without obvious caries or gingival swelling.  Oropharynx without erythema, exudate, or swelling.  Neck: supple/nontender.  No LAD, mass, or TM.  Carotid pulses 2+ bilaterally, without bruits. CV: RRR, no m/r/g.   LUNGS: CTA bilat, nonlabored resps, good aeration in all lung fields. ABD: soft, NT, ND, BS normal.  No hepatospenomegaly or mass.  No bruits. EXT: no clubbing, cyanosis, or edema.  Left thigh TTP anteromedial aspect about mid thigh level, no palpable fluctuance or induration.  No thigh asymmetry.  Hip ROM does not elicit any pain.  Active and passive hip flexion on L elicits her L mid thigh  pain.  No greater troch, groin, or glut/hip tenderness. Musculoskeletal: no joint swelling, erythema, warmth, or tenderness.  ROM of all joints intact. Skin - no sores or suspicious lesions or rashes or color changes   Pertinent labs:  Lab Results  Component Value Date   TSH 1.24 04/20/2017   Lab Results  Component Value Date   WBC 5.9 10/28/2020   HGB 13.1 10/28/2020   HCT 39.9 10/28/2020   MCV 90.0 10/28/2020   PLT 191.0 10/28/2020   Lab Results  Component Value Date   IRON 44  10/11/2019   TIBC 236 (L) 10/11/2019   FERRITIN 86 10/11/2019    Lab Results  Component Value Date   CREATININE 0.93 10/28/2020   BUN 16 10/28/2020   NA 137 10/28/2020   K 4.3 10/28/2020   CL 100 10/28/2020   CO2 31 10/28/2020   Lab Results  Component Value Date   ALT 17 10/28/2020   AST 19 10/28/2020   ALKPHOS 68 10/28/2020   BILITOT 0.3 10/28/2020   Lab Results  Component Value Date   CHOL 130 10/28/2020   Lab Results  Component Value Date   HDL 43.00 10/28/2020   Lab Results  Component Value Date   LDLCALC 71 10/28/2020   Lab Results  Component Value Date   TRIG 79.0 10/28/2020   Lab Results  Component Value Date   CHOLHDL 3 10/28/2020   Lab Results  Component Value Date   HGBA1C 7.1 (H) 10/28/2020    ASSESSMENT AND PLAN:   1) COPD exacerbation: resolving appropriately. Needs to restart flovent so I RF'd this med.  2) DM 2, doing well on low doses of insulin. Continue this. Hba1c today.  Pt was unable to produce a urine specimen for microalbuminuria testing.  3) HLD: tolerating atorva 48m qd. FLP and hepatic panel today.  4) CRI III: avoids NSAIDs. Drinks fluids well.  5) L thigh pain, subacute---seems to be ever since she fell on L hip/leg about 8 mo ago. Check left femur x-ray.  6) Health maintenance exam: Reviewed age and gender appropriate health maintenance issues (prudent diet, regular exercise, health risks of tobacco and excessive alcohol, use of seatbelts, fire alarms in home, use of sunscreen).  Also reviewed age and gender appropriate health screening as well as vaccine recommendations. Vaccines: flu given today.  She is o/w UTD. Labs: CBC, CMET, FLP, Hba1c, urine microalb/cr. Cervical ca screening: no further cerv ca screening indicated. Breast ca screening: mammogram 04/30/20 normal.  Rpt 04/2021. Colon ca screening: 2020 colonoscopy normal.  No further colon cancer screening recommended due to age. Osteoporosis: 03/2018 DEXA with T  score -2.7-->started boniva-->due for repeat DEXA at this time->ordered today . An After Visit Summary was printed and given to the patient.  FOLLOW UP:  Return in about 3 months (around 01/26/2021) for routine chronic illness f/u.  Signed:  PCrissie Sickles MD           10/28/2020

## 2020-10-30 ENCOUNTER — Other Ambulatory Visit: Payer: Self-pay | Admitting: Family Medicine

## 2020-11-19 ENCOUNTER — Other Ambulatory Visit: Payer: Self-pay | Admitting: Family Medicine

## 2020-11-19 DIAGNOSIS — Z1231 Encounter for screening mammogram for malignant neoplasm of breast: Secondary | ICD-10-CM

## 2020-11-19 DIAGNOSIS — E2839 Other primary ovarian failure: Secondary | ICD-10-CM

## 2020-11-19 DIAGNOSIS — M810 Age-related osteoporosis without current pathological fracture: Secondary | ICD-10-CM

## 2020-12-04 ENCOUNTER — Other Ambulatory Visit: Payer: Self-pay

## 2020-12-04 ENCOUNTER — Telehealth: Payer: Self-pay | Admitting: Family Medicine

## 2020-12-04 DIAGNOSIS — E119 Type 2 diabetes mellitus without complications: Secondary | ICD-10-CM

## 2020-12-04 MED ORDER — ONETOUCH ULTRA VI STRP: ORAL_STRIP | 5 refills | Status: DC

## 2020-12-04 MED ORDER — ONETOUCH DELICA PLUS LANCET33G MISC: 5 refills | Status: DC

## 2020-12-04 NOTE — Telephone Encounter (Signed)
Patient requesting refills of lancets and test strips. Please send to same [facility] on [address]. Patient is out of these supplies.

## 2020-12-04 NOTE — Telephone Encounter (Signed)
Rx sent with 5 additional refills. Patient's daughter, [name] advised.

## 2020-12-06 IMAGING — CR DG HIP (WITH OR WITHOUT PELVIS) 2-3V*L*
3 series · 3 of 3 positions shown · non-contrast
Comparison: Pelvis and right hip x-rays dated October 08, 2019.

CLINICAL DATA: Left hip pain after fall.

EXAM:
DG HIP (WITH OR WITHOUT PELVIS) 2-3V LEFT

[t pelvis ap]
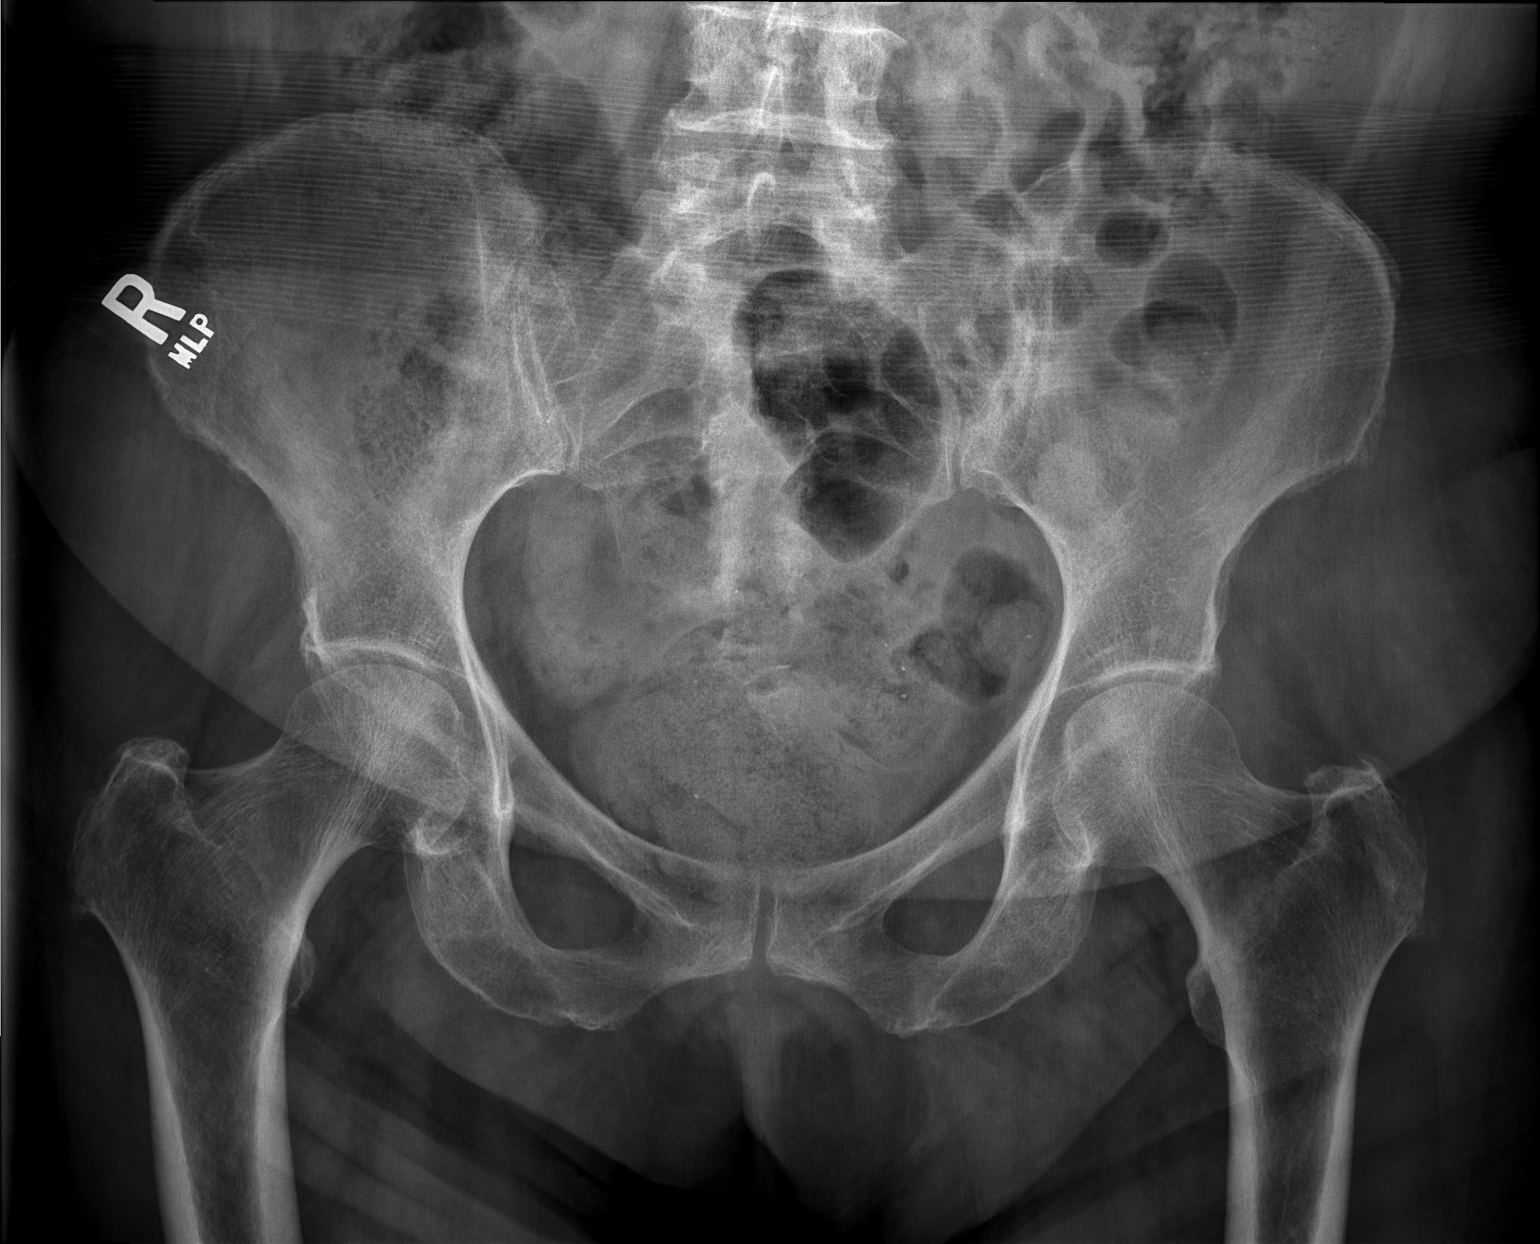

[t hip ap left]
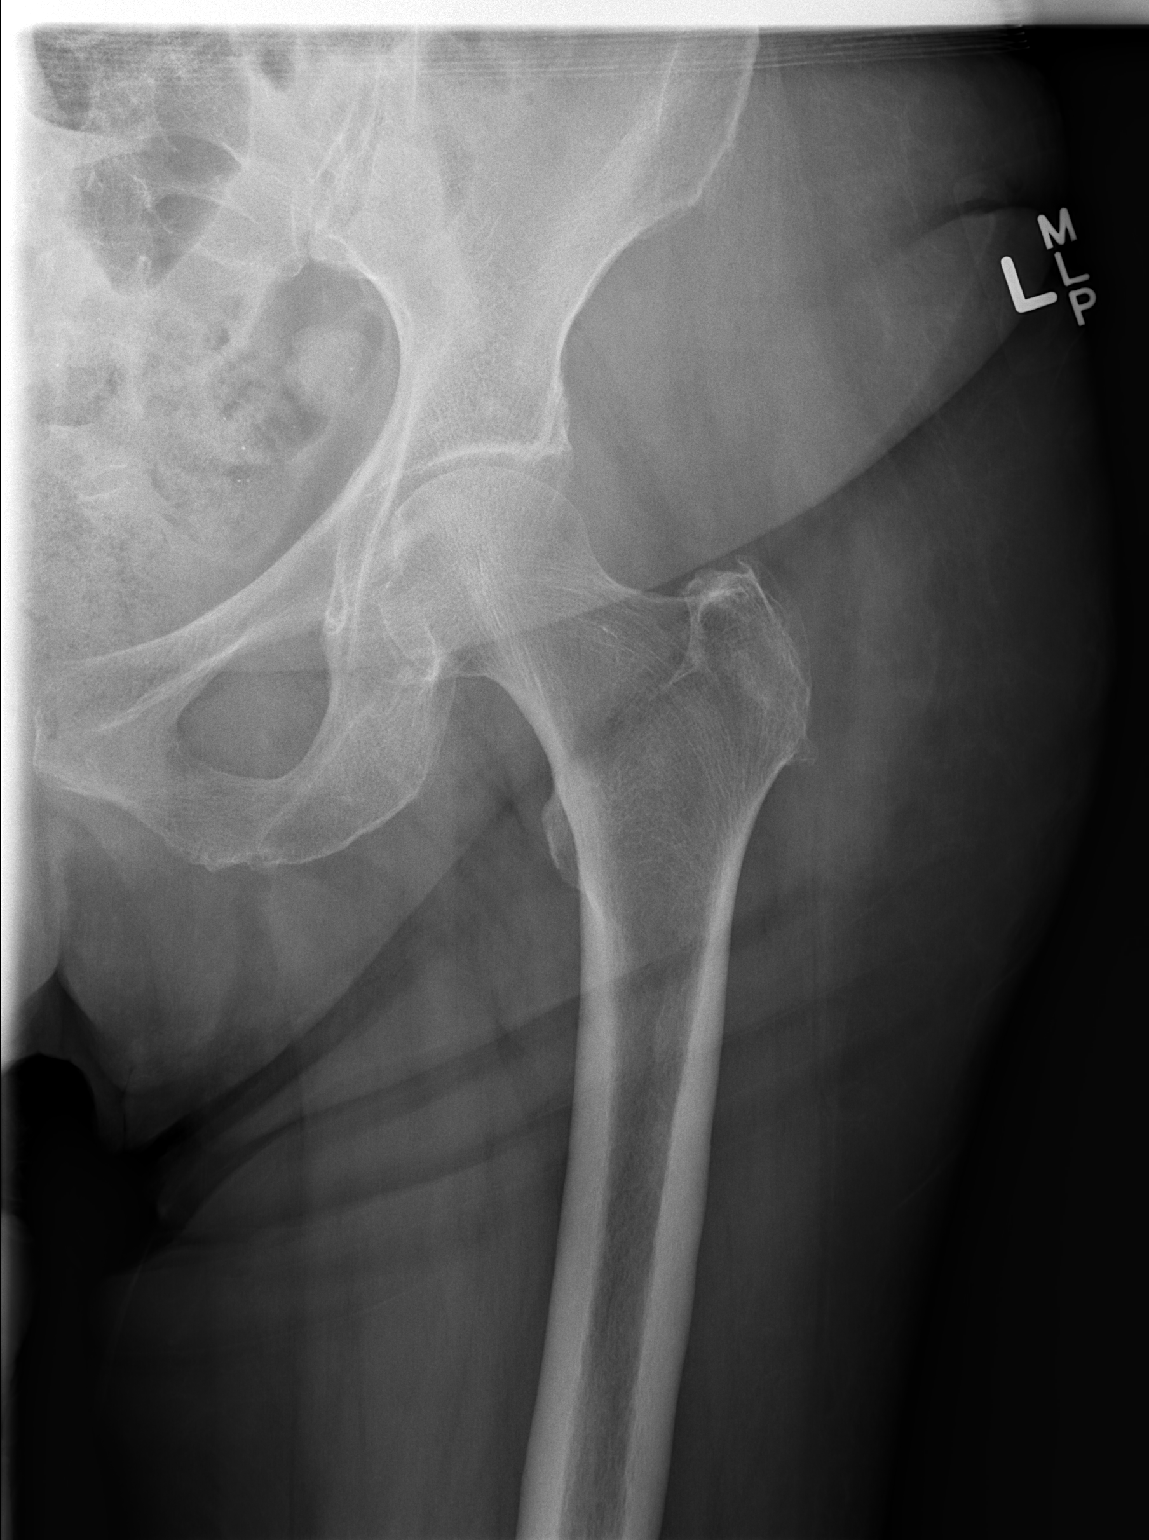

[t hip frog leg left]
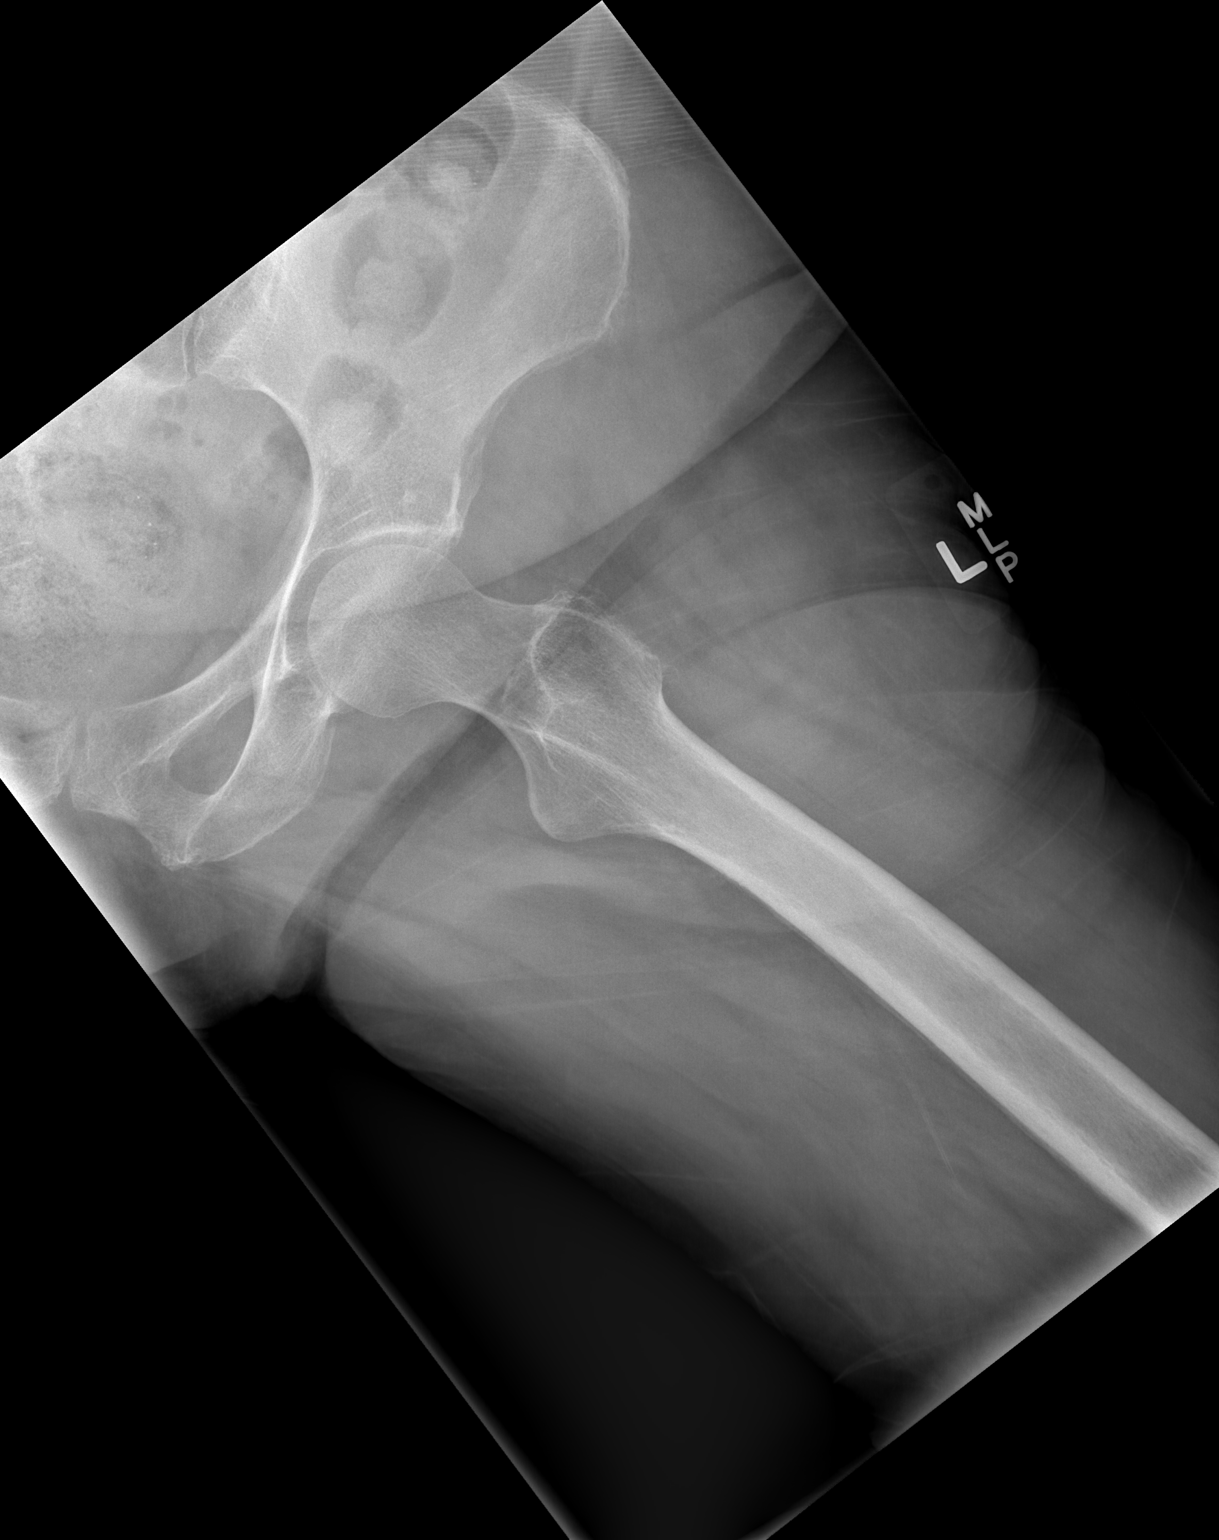

[3 of 3 positions shown; findings below may reference images not displayed]

FINDINGS: There is no evidence of hip fracture or dislocation. There is no
evidence of arthropathy or other focal bone abnormality.
IMPRESSION: Negative.

## 2020-12-07 ENCOUNTER — Other Ambulatory Visit: Payer: Self-pay | Admitting: Family Medicine

## 2020-12-16 ENCOUNTER — Ambulatory Visit (INDEPENDENT_AMBULATORY_CARE_PROVIDER_SITE_OTHER): Payer: Medicare Other

## 2020-12-16 ENCOUNTER — Telehealth: Payer: Self-pay | Admitting: Family Medicine

## 2020-12-16 ENCOUNTER — Other Ambulatory Visit: Payer: Self-pay

## 2020-12-16 VITALS — BP 122/78 | HR 92 | Temp 96.9°F | Resp 18 | Ht 62.0 in | Wt 139.0 lb

## 2020-12-16 DIAGNOSIS — Z0000 Encounter for general adult medical examination without abnormal findings: Secondary | ICD-10-CM | POA: Diagnosis not present

## 2020-12-16 DIAGNOSIS — Z0279 Encounter for issue of other medical certificate: Secondary | ICD-10-CM

## 2020-12-16 NOTE — Progress Notes (Signed)
Subjective:   [name] is a 75 y.o. female who presents for Medicare Annual (Subsequent) preventive examination.   Review of Systems     Cardiac Risk Factors include: advanced age (>49mn, >>16women);hypertension;dyslipidemia;diabetes mellitus;sedentary lifestyle     Objective:    Today's Vitals   12/16/20 1535  BP: 122/78  Pulse: 92  Resp: 18  Temp: (!) 96.9 F (36.1 C)  TempSrc: Temporal  SpO2: 96%  Weight: 139 lb (63 kg)  Height: 5' 2"  (1.575 m)   Body mass index is 25.42 kg/m.  Advanced Directives 12/16/2020 10/09/2019 10/02/2019 04/19/2019 12/14/2018 11/12/2018 08/25/2018  Does Patient Have a Medical Advance Directive? Yes No No Yes Yes No Yes  Type of AParamedicof AWaldronLiving will - - Living will;Healthcare Power of Attorney Living will - [place] Does patient want to make changes to medical advance directive? - - - No - Patient declined - - -  Copy of [place] Chart? Yes - validated most recent copy scanned in chart (See row information) - - - - - -  Would patient like information on creating a medical advance directive? - Yes (ED - Information included in AVS) Yes (ED - Information included in AVS) - - Yes (MAU/Ambulatory/Procedural Areas - Information given) -  Pre-existing out of facility DNR order (yellow form or pink MOST form) - - - - - - -    Current Medications (verified) Outpatient Encounter Medications as of 12/16/2020  Medication Sig   albuterol (PROVENTIL) (2.5 MG/3ML) 0.083% nebulizer solution USE 1 VIAL IN NEBULIZER EVERY 4 HOURS AS NEEDED FOR WHEEZING AND FOR SHORTNESS OF BREATH   albuterol (VENTOLIN HFA) 108 (90 Base) MCG/ACT inhaler INHALE 2 PUFFS BY MOUTH EVERY 6 HOURS AS NEEDED FOR WHEEZING OR SHORTNESS OF BREATH   aspirin 81 MG EC tablet Take 81 mg by mouth daily.    atorvastatin (LIPITOR) 40 MG tablet Take 1 tablet by mouth once daily   buPROPion (WELLBUTRIN XL)  150 MG 24 hr tablet Take 1 tablet by mouth once daily   cetirizine (ZYRTEC) 10 MG tablet Take 10 mg by mouth daily.   escitalopram (LEXAPRO) 20 MG tablet TAKE 1 TABLET BY MOUTH ONCE DAILY .   fluticasone (FLOVENT HFA) 220 MCG/ACT inhaler Inhale 2 puffs into the lungs 2 (two) times daily.   glucose blood (ONETOUCH ULTRA) test strip USE 1 STRIP TO CHECK GLUCOSE THREE TIMES DAILY   ibandronate (BONIVA) 150 MG tablet Take 1 tablet (150 mg total) by mouth every 30 (thirty) days. Take in the morning with a full glass of water, on an empty stomach, and do not take anything else by mouth or lie down for the next 30 min.   Insulin Isophane & Regular Human (HUMULIN 70/30 KWIKPEN) (70-30) 100 UNIT/ML PEN Take 4 U SQ with breakfast and 2 U SQ with supper.   Lancets (ONETOUCH DELICA PLUS [id number] MISC USE 1  TO CHECK GLUCOSE THREE TIMES DAILY   metoprolol tartrate (LOPRESSOR) 25 MG tablet 1/2 tab po bid   Multiple Vitamins-Minerals (MULTIVITAMIN WOMEN PO) Take by mouth daily.   pantoprazole (PROTONIX) 40 MG tablet Take 1 tablet (40 mg total) by mouth daily.   ferrous sulfate 325 (65 FE) MG EC tablet Take 325 mg by mouth 2 (two) times daily. (Patient not taking: Reported on 12/16/2020)   No facility-administered encounter medications on file as of 12/16/2020.    Allergies (verified) Lactose intolerance (gi) and  Ibuprofen   History: Past Medical History:  Diagnosis Date   Age-related nuclear cataract of both eyes 2016   +cortical age related cataracts OU    Anxiety    Arthritis    hands   Bilateral diabetic retinopathy ([place]) 2015   Dr. 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   COPD (chronic obstructive pulmonary disease) (HCC)    CVA (cerebral vascular accident) ([place]) 09/2016   MRI did show a  left-sided small ischemic stroke which was acute but likely incidental (MRI was done b/c pt had TIA sx's in hospital).  Neuro put her on plavix at that time.  Cardiology d/c'd her plavix 08/2017.   Depression    Diabetes mellitus with complication (HCC)    diab retinopathy OU (laser)   Finger fracture, right 04/2020   R 5th metacarpal fx at the base (Dr. Mardelle Matte)   GERD (gastroesophageal reflux disease)    protonix   History of concussion 08/25/2018   w/out loss of consciousness--mild increase in baseline memory impairment after.  CT head neg acute.   History of diverticulitis of colon    with abscess; required IR percutaneous drain placed 09/2016.   Hyperlipidemia    Hypertension    Iron deficiency anemia    started iron 07/02/19.  EGD/colonoscopy 08/27/19 unrevealing. Capsule endoscopy to be done.   Left bundle branch block (LBBB) 12/16/2016   Lung cancer ([place])    non-small cell lung ca, stage III in 05/2011; systemic chemotherapy concurrent with radiation followed by prophylactic cranial irradiation and has been observation since July of 2010 with no evidence for disease recurrence-released from onc f/u 12/2014 (needs annual cxr by PCP).  CXR 08/2015 stable.  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 Earlie Server did bx and this was NEG for malignancy.   Mild cognitive impairment with memory loss    Likely from brain radiation therapy   Non-obstructive CAD (coronary artery disease)    a. Cath 2006 preserved LV fxn, scattered irregularities without critical stenosis; b. 2008 stress echo negative for ischemia, but with hypertensive response; c. 08/2016 Cath: D1 25%, otw nl.   Normocytic anemia 2018   Iron, vit B12, folate all normal 12/2016.   Open-angle glaucoma 2016   Dr. Shirley Muscat, (bilateral)---responding to topical therapy   Osteoporosis 03/2016; 03/2018   03/2016 DEXA T-score -2.2.  03/2018 T-score L femoral neck  -2.7--boniva started.   Pneumonia    hospitalization 11/2016; hypoxemic  resp failure--d/c'd home with home oxygen therapy   Seasonal allergic rhinitis    Subclinical hyperthyroidism 2018   Takotsubo cardiomyopathy    a. 08/2016 Echo: EF 30-35%, gr1DD, PASP 1mHg;  b. 08/2016 Cath: nonobs Dzs;  c. 11/2016 Echo: EF 40-45%, no rwma, nl RV fxn, mild TR, PASP 351mg.  ECHO 02/2017 EF 50-55%, grd II DD---essentially resolved Takotsubo 02/2017.   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 08/2016 in setting of diverticulitis & pneumoperitoneum and Takotsubo CM -->prolonged QT, seen by EP-->avoid meds with potential for QT prolongation.   Past Surgical History:  Procedure Laterality Date   ABDOMINAL HYSTERECTOMY  1997   APPENDECTOMY     CARDIAC CATHETERIZATION N/A 09/14/2016   Procedure: Left Heart Cath and Coronary Angiography;  Surgeon: ThTroy SineMD;  [facility];  Service: Cardiovascular; Mild nonobstructive CAD  with 85% smooth narrowing in the first diagonal branch of the LAD; 10% smooth narrowing of the ostial proximal left circumflex coronary artery; and a normal dominant RCA.   CARPAL TUNNEL RELEASE     CATARACT EXTRACTION Bilateral    CHOLECYSTECTOMY  2002   COLONOSCOPY  10/24/00; 08/27/19   2002 normal.  [facility] hemoccult testing via [facility] 06/18/15 was NEG.  2020->diverticulosis o/w nl.   dexa  03/2016; 03/2018   03/2018 T-score L femoral neck  -2.7 (worsened compared to osteopenic range 2017.)   ESOPHAGOGASTRODUODENOSCOPY  08/27/2019   Normal.   IR GENERIC HISTORICAL  09/19/2016   IR SINUS/FIST TUBE CHK-NON GI 09/19/2016 Corrie Mckusick, DO [facility]   IR GENERIC HISTORICAL  10/06/2016   IR RADIOLOGIST EVAL & MGMT 10/06/2016 [facility]   IR GENERIC HISTORICAL  10/26/2016   IR RADIOLOGIST EVAL & MGMT 10/26/2016 [facility]   IR GENERIC HISTORICAL  11/03/2016   IR RADIOLOGIST EVAL & MGMT 11/03/2016 Ardis Rowan, PA-C [facility]   IR GENERIC HISTORICAL  11/24/2016   IR RADIOLOGIST EVAL & MGMT 11/24/2016 Ardis Rowan, PA-C [facility]   IR GENERIC HISTORICAL  12/05/2016   Fistula smaller/improving.  IR SINUS/FIST TUBE CHK-NON GI 12/05/2016 Sandi Mariscal, MD [facility]   IR GENERIC HISTORICAL  12/22/2016   IR RADIOLOGIST EVAL & MGMT 12/22/2016 Greggory Keen, MD [facility]   IR RADIOLOGIST EVAL & MGMT  01/10/2017   IR RADIOLOGIST EVAL & MGMT  02/09/2017   IR SINUS/FIST TUBE CHK-NON GI  03/28/2017   LEFT HEART CATHETERIZATION WITH CORONARY ANGIOGRAM N/A 05/30/2012   Procedure: LEFT HEART CATHETERIZATION WITH CORONARY ANGIOGRAM;  Surgeon: Burnell Blanks, MD;  Location: [facility];  Service: Cardiovascular: Mild, non-obstructive CAD   TRANSTHORACIC ECHOCARDIOGRAM  09/14/2016; 11/2016   08/2016: EF 30-35 %, Akinesis of the mid-apicalanteroseptal myocardium.  Grade I DD.  Mild pulm HTN.  Repeat echo 11/2016, EF 40-45%, no rwma, nl RV fxn, mild TR, PASP 16mHg.     TRANSTHORACIC ECHOCARDIOGRAM  03/07/2017   EF 50-55%. Hypokinesis of the distal septum with overall low normal LV,  systolic function; mild diastolic dysfunction with elevated LV  filling pressure; mildly calcified aortic valve with mild AI; small pericardial effusion   Family History  Problem Relation Age of Onset   Heart failure Mother    Kidney disease Mother        renal failure   Diabetes Mother    Diabetes Father    Diabetes Brother    Heart disease Brother    Heart attack Brother    Heart attack Brother    Heart attack Brother    Pancreatic cancer Brother    Colon cancer Neg Hx    Esophageal cancer Neg Hx    Stomach cancer Neg Hx    Rectal cancer Neg Hx    Social History   Socioeconomic History   Marital status: Widowed    Spouse name: Not on file   Number of children: Not on file   Years of education: Not on file   Highest education level: Not on file  Occupational History   Occupation: Retired CPublic affairs consultant UNEMPLOYED  Tobacco Use   Smoking status: Former  Smoker    Packs/day: 1.00    Years: 45.00    Pack years: 45.00    Types: Cigarettes    Quit date: 10/24/2008    Years since quitting: 12.1  Smokeless tobacco: Never Used  Vaping Use   Vaping Use: Never used  Substance and Sexual Activity   Alcohol use: No    Alcohol/week: 0.0 standard drinks   Drug use: No   Sexual activity: Not on file  Other Topics Concern   Not on file  Social History Narrative   Widow, lives in [place] with her daughter [name].   Husband died 2019/08/11 (esoph cancer, copd, chf).   No alcohol.   Social Determinants of Health   Financial Resource Strain: Low Risk    Difficulty of Paying Living Expenses: Not hard at all  Food Insecurity: No Food Insecurity   Worried About Charity fundraiser in the Last Year: Never true   [place] in the Last Year: Never true  Transportation Needs: No Transportation Needs   Lack of Transportation (Medical): No   Lack of Transportation (Non-Medical): No  Physical Activity: Inactive   Days of Exercise per Week: 0 days   Minutes of Exercise per Session: 0 min  Stress: No Stress Concern Present   Feeling of Stress : Not at all  Social Connections: Moderately Isolated   Frequency of Communication with Friends and Family: More than three times a week   Frequency of Social Gatherings with Friends and Family: More than three times a week   Attends Religious Services: 1 to 4 times per year   Active Member of [employer] or Organizations: No   Attends Archivist Meetings: Never   Marital Status: Widowed    Tobacco Counseling Counseling given: Not Answered   Clinical Intake:  Pre-visit preparation completed: Yes  Pain : No/denies pain     Nutritional Status: BMI 25 -29 Overweight Nutritional Risks: None Diabetes: Yes CBG done?: No Did pt. bring in CBG monitor from home?: No  How often do you need to have someone help you when you read instructions, pamphlets, or other written  materials from your doctor or pharmacy?: 1 - Never  Diabetes:  Is the patient diabetic?  Yes  If diabetic, was a CBG obtained today?  No  Did the patient bring in their glucometer from home?  No  How often do you monitor your CBG's? Twice daily.   Financial Strains and Diabetes Management:  Are you having any financial strains with the device, your supplies or your medication? No .  Does the patient want to be seen by Chronic Care Management for management of their diabetes?  No  Would the patient like to be referred to a Nutritionist or for Diabetic Management?  No   Diabetic Exams:  Diabetic Eye Exam: Completed 03/25/2020.   Diabetic Foot Exam: Completed 04/24/2020.    Interpreter Needed?: No  Information entered by :: Caroleen Hamman LPN   Activities of Daily Living In your present state of health, do you have any difficulty performing the following activities: 12/16/2020  Hearing? N  Vision? N  Difficulty concentrating or making decisions? 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 Y  Dressing or bathing? N  Doing errands, shopping? Y  Preparing Food and eating ? Y  Using the Toilet? N  In the past six months, have you accidently leaked urine? Y  Comment wears depends  Do you have problems with loss of bowel control? N  Managing your Medications? Y  Managing your Finances? Y  Housekeeping or managing your Housekeeping? Y  Some recent data might be hidden  Patient Care Team: [name], MD as PCP - General (Family Medicine) Leonie Man, MD as PCP - Cardiology (Cardiology) Rigoberto Noel, MD as Consulting Physician (Pulmonary Disease) Shirley Muscat Loreen Freud, MD as Consulting Physician (Optometry) Curt Bears, MD as Consulting Physician (Oncology) Hayden Pedro, MD as Consulting Physician (Ophthalmology) Almyra Deforest, [place] as Consulting Physician (Cardiology) Leonie Man, MD as Consulting Physician  (Cardiology) Ralene Ok, MD as Consulting Physician (General Surgery)  Indicate any recent Medical Services you may have received from other than [facility] providers in the past year (date may be approximate).     Assessment:   This is a routine wellness examination for Kelena.  Hearing/Vision screen  Hearing Screening   125Hz  250Hz  500Hz  1000Hz  2000Hz  3000Hz  4000Hz  6000Hz  8000Hz   Right ear:           Left ear:           Comments: Daughter says she does have some hearing loss  Vision Screening Comments: Reading glasses Last eye exam-03/2020  Dietary issues and exercise activities discussed: Current Exercise Habits: The patient does not participate in regular exercise at present, Exercise limited by: None identified  Goals     Increase physical activity     Increase walking     Patient Stated     Maintain current health.       Depression Screen PHQ 2/9 Scores 12/16/2020 08/29/2019 08/09/2019 04/19/2019 12/14/2018 12/13/2018 11/12/2018  PHQ - 2 Score 0 1 3 3 1 3 6   PHQ- 9 Score - 9 9 8  - 19 22    Fall Risk Fall Risk  12/16/2020 10/28/2020 08/29/2019 08/09/2019 04/19/2019  Falls in the past year? 1 1 1 1 1   Comment - - - - -  Number falls in past yr: 0 1 1 1 1   Comment - - - - -  Injury with Fall? 0 1 0 0 0  Risk Factor Category  - - - - -  Risk for fall due to : - - - History of fall(s);Impaired balance/gait;Impaired mobility History of fall(s);Impaired balance/gait;Impaired mobility  Risk for fall due to: Comment - - - - -  Follow up Falls prevention discussed Falls evaluation completed - Falls evaluation completed;Falls prevention discussed;Education provided Falls evaluation completed;Education provided;Falls prevention discussed  Comment - - - - -    FALL RISK PREVENTION PERTAINING TO THE HOME:  Any stairs in or around the home? No  Home free of loose throw rugs in walkways, pet beds, electrical cords, etc? Yes  Adequate lighting in your home to reduce risk of falls? Yes    ASSISTIVE DEVICES UTILIZED TO PREVENT FALLS:  Life alert? No  Use of a cane, walker or w/c? No  Grab bars in the bathroom? Yes  Shower chair or bench in shower? Yes  Elevated toilet seat or a handicapped toilet? No   TIMED UP AND GO:  Was the test performed? Yes .  Length of time to ambulate 10 feet: 12 sec.   Gait slow and steady without use of assistive device  Cognitive Function: MMSE - Mini Mental State Exam 11/12/2018 11/10/2017 07/05/2016  Orientation to time 4 5 2   Orientation to Place 5 5 5   Registration 3 3 3   Attention/ Calculation 3 3 5   Recall 0 2 3  Language- name 2 objects 2 2 2   Language- repeat 1 1 1   Language- follow 3 step command 3 3 3   Language- read & follow direction 1 1 1  Write a sentence 1 1 1   Copy design 1 1 1   Total score 24 27 27      6CIT Screen 12/16/2020  What Year? 0 points  What month? 3 points  What time? 0 points  Count back from 20 0 points  Months in reverse 2 points  Repeat phrase 2 points  Total Score 7    Immunizations Immunization History  Administered Date(s) Administered   Fluad Quad(high Dose 65+) 08/29/2019, 10/13/2019, 10/28/2020   Influenza Split 07/06/2012   Influenza Whole 07/24/2006, 08/06/2007, 07/18/2008, 07/29/2009, 08/25/2010, 07/01/2011   Influenza, High Dose Seasonal PF 07/03/2015, 07/05/2016, 08/10/2017, 08/10/2018   Influenza,inj,Quad PF,6+ Mos 07/18/2013, 08/21/2014   Pneumococcal Conjugate-13 02/05/2011   Pneumococcal Polysaccharide-23 07/18/2008, 05/13/2014, 10/13/2019   Tdap 06/10/2017   Zoster 04/07/2015    TDAP status: Up to date  Flu Vaccine status: Up to date  Pneumococcal vaccine status: Up to date  Covid-19 vaccine status: Declined, Education has been provided regarding the importance of this vaccine but patient still declined. Advised may receive this vaccine at local pharmacy or Health Dept.or vaccine clinic. Aware to provide a copy of the vaccination record if obtained from  local pharmacy or Health Dept. Verbalized acceptance and understanding.  Qualifies for Shingles Vaccine? Yes   Zostavax completed Yes   Shingrix Completed?: No.    Education has been provided regarding the importance of this vaccine. Patient has been advised to call insurance company to determine out of pocket expense if they have not yet received this vaccine. Advised may also receive vaccine at local pharmacy or Health Dept. Verbalized acceptance and understanding.  Screening Tests Health Maintenance  Topic Date Due   COVID-19 Vaccine (1) Never done   URINE MICROALBUMIN  08/11/2019   OPHTHALMOLOGY EXAM  03/25/2021   FOOT EXAM  04/24/2021   HEMOGLOBIN A1C  04/27/2021   COLONOSCOPY (Pts 45-7yr Insurance coverage will need to be confirmed)  08/26/2024   INFLUENZA VACCINE  Completed   DEXA SCAN  Completed   Hepatitis C Screening  Completed   PNA vac Low Risk Adult  Completed    Health Maintenance  Health Maintenance Due  Topic Date Due   COVID-19 Vaccine (1) Never done   URINE MICROALBUMIN  08/11/2019    Colorectal cancer screening: Type of screening: Colonoscopy. Completed 08/27/2019. Repeat every 3 years  Mammogram status: Completed Bilateral 04/30/2020. Repeat every year  Bone Density status: Scheduled for 05/03/2021  Lung Cancer Screening: (Low Dose CT Chest recommended if Age 73-80years, 30 pack-year currently smoking OR have quit w/in 15years.) does qualify.   Lung Cancer Screening Referral: Discuss with PCP  Additional Screening:  Hepatitis C Screening:Completed 03/03/2016  Vision Screening: Recommended annual ophthalmology exams for early detection of glaucoma and other disorders of the eye. Is the patient up to date with their annual eye exam?  Yes  Who is the provider or what is the name of the office in which the patient attends annual eye exams? Unsure of name  Dental Screening: Recommended annual dental exams for proper oral hygiene  Community  Resource Referral / Chronic Care Management: CRR required this visit?  No   CCM required this visit?  No      Plan:     I have personally reviewed and noted the following in the patients chart:    Medical and social history  Use of alcohol, tobacco or illicit drugs   Current medications and supplements  Functional ability and status  Nutritional status  Physical activity  Advanced directives  List of other physicians  Hospitalizations, surgeries, and ER visits in previous 12 months  Vitals  Screenings to include cognitive, depression, and falls  Referrals and appointments  In addition, I have reviewed and discussed with patient certain preventive protocols, quality metrics, and best practice recommendations. A written personalized care plan for preventive services as well as general preventive health recommendations were provided to patient.      Marta Antu, LPN   [id number]  Nurse Health Advisor  Nurse Notes: None

## 2020-12-16 NOTE — Telephone Encounter (Signed)
Patient and caregiver dropped off disability parking forms to be filled out. Please call Aasiyah Auerbach when complete, [phone]. Placed in Dr. Idelle Leech inbox in front office to be signed.

## 2020-12-16 NOTE — Patient Instructions (Signed)
[name] , Thank you for taking time to come for your Medicare Wellness Visit. I appreciate your ongoing commitment to your health goals. Please review the following plan we discussed and let me know if I can assist you in the future.   Screening recommendations/referrals: Colonoscopy: 08/27/2019-Due 08/26/2022 Mammogram: Completed 04/30/2020-Due 04/30/2021 Bone Density: Scheduled for 05/03/2021 Recommended yearly ophthalmology/optometry visit for glaucoma screening and checkup Recommended yearly dental visit for hygiene and checkup  Vaccinations: Influenza vaccine: Up to date Pneumococcal vaccine: Completed vaccines Tdap vaccine: Up to date-Due 06/11/2027 Shingles vaccine: Discuss with pharmacy Covid-19:Declined  Advanced directives: Copy in chart  Conditions/risks identified: See problem list  Next appointment: Follow up in one year for your annual wellness visit 12/22/2021 @ 3:45   Preventive Care 73 Years and Older, Female Preventive care refers to lifestyle choices and visits with your health care provider that can promote health and wellness. What does preventive care include?  A yearly physical exam. This is also called an annual well check.  Dental exams once or twice a year.  Routine eye exams. Ask your health care provider how often you should have your eyes checked.  Personal lifestyle choices, including:  Daily care of your teeth and gums.  Regular physical activity.  Eating a healthy diet.  Avoiding tobacco and drug use.  Limiting alcohol use.  Practicing safe sex.  Taking low-dose aspirin every day.  Taking vitamin and mineral supplements as recommended by your health care provider. What happens during an annual well check? The services and screenings done by your health care provider during your annual well check will depend on your age, overall health, lifestyle risk factors, and family history of disease. Counseling  Your health care provider may ask  you questions about your:  Alcohol use.  Tobacco use.  Drug use.  Emotional well-being.  Home and relationship well-being.  Sexual activity.  Eating habits.  History of falls.  Memory and ability to understand (cognition).  Work and work Statistician.  Reproductive health. Screening  You may have the following tests or measurements:  Height, weight, and BMI.  Blood pressure.  Lipid and cholesterol levels. These may be checked every 5 years, or more frequently if you are over 10 years old.  Skin check.  Lung cancer screening. You may have this screening every year starting at age 39 if you have a 30-pack-year history of smoking and currently smoke or have quit within the past 15 years.  Fecal occult blood test (FOBT) of the stool. You may have this test every year starting at age 76.  Flexible sigmoidoscopy or colonoscopy. You may have a sigmoidoscopy every 5 years or a colonoscopy every 10 years starting at age 30.  Hepatitis C blood test.  Hepatitis B blood test.  Sexually transmitted disease (STD) testing.  Diabetes screening. This is done by checking your blood sugar (glucose) after you have not eaten for a while (fasting). You may have this done every 1-3 years.  Bone density scan. This is done to screen for osteoporosis. You may have this done starting at age 45.  Mammogram. This may be done every 1-2 years. Talk to your health care provider about how often you should have regular mammograms. Talk with your health care provider about your test results, treatment options, and if necessary, the need for more tests. Vaccines  Your health care provider may recommend certain vaccines, such as:  Influenza vaccine. This is recommended every year.  Tetanus, diphtheria, and acellular pertussis (Tdap,  Td) vaccine. You may need a Td booster every 10 years.  Zoster vaccine. You may need this after age 63.  Pneumococcal 13-valent conjugate (PCV13) vaccine. One dose  is recommended after age 57.  Pneumococcal polysaccharide (PPSV23) vaccine. One dose is recommended after age 44. Talk to your health care provider about which screenings and vaccines you need and how often you need them. This information is not intended to replace advice given to you by your health care provider. Make sure you discuss any questions you have with your health care provider. Document Released: 11/06/2015 Document Revised: 06/29/2016 Document Reviewed: 08/11/2015 Elsevier Interactive Patient Education  2017 [place] Prevention in the Home Falls can cause injuries. They can happen to people of all ages. There are many things you can do to make your home safe and to help prevent falls. What can I do on the outside of my home?  Regularly fix the edges of walkways and driveways and fix any cracks.  Remove anything that might make you trip as you walk through a door, such as a raised step or threshold.  Trim any bushes or trees on the path to your home.  Use bright outdoor lighting.  Clear any walking paths of anything that might make someone trip, such as rocks or tools.  Regularly check to see if handrails are loose or broken. Make sure that both sides of any steps have handrails.  Any raised decks and porches should have guardrails on the edges.  Have any leaves, snow, or ice cleared regularly.  Use sand or salt on walking paths during winter.  Clean up any spills in your garage right away. This includes oil or grease spills. What can I do in the bathroom?  Use night lights.  Install grab bars by the toilet and in the tub and shower. Do not use towel bars as grab bars.  Use non-skid mats or decals in the tub or shower.  If you need to sit down in the shower, use a plastic, non-slip stool.  Keep the floor dry. Clean up any water that spills on the floor as soon as it happens.  Remove soap buildup in the tub or shower regularly.  Attach bath mats  securely with double-sided non-slip rug tape.  Do not have throw rugs and other things on the floor that can make you trip. What can I do in the bedroom?  Use night lights.  Make sure that you have a light by your bed that is easy to reach.  Do not use any sheets or blankets that are too big for your bed. They should not hang down onto the floor.  Have a firm chair that has side arms. You can use this for support while you get dressed.  Do not have throw rugs and other things on the floor that can make you trip. What can I do in the kitchen?  Clean up any spills right away.  Avoid walking on wet floors.  Keep items that you use a lot in easy-to-reach places.  If you need to reach something above you, use a strong step stool that has a grab bar.  Keep electrical cords out of the way.  Do not use floor polish or wax that makes floors slippery. If you must use wax, use non-skid floor wax.  Do not have throw rugs and other things on the floor that can make you trip. What can I do with my stairs?  Do not  leave any items on the stairs.  Make sure that there are handrails on both sides of the stairs and use them. Fix handrails that are broken or loose. Make sure that handrails are as long as the stairways.  Check any carpeting to make sure that it is firmly attached to the stairs. Fix any carpet that is loose or worn.  Avoid having throw rugs at the top or bottom of the stairs. If you do have throw rugs, attach them to the floor with carpet tape.  Make sure that you have a light switch at the top of the stairs and the bottom of the stairs. If you do not have them, ask someone to add them for you. What else can I do to help prevent falls?  Wear shoes that:  Do not have high heels.  Have rubber bottoms.  Are comfortable and fit you well.  Are closed at the toe. Do not wear sandals.  If you use a stepladder:  Make sure that it is fully opened. Do not climb a closed  stepladder.  Make sure that both sides of the stepladder are locked into place.  Ask someone to hold it for you, if possible.  Clearly mark and make sure that you can see:  Any grab bars or handrails.  First and last steps.  Where the edge of each step is.  Use tools that help you move around (mobility aids) if they are needed. These include:  Canes.  Walkers.  Scooters.  Crutches.  Turn on the lights when you go into a dark area. Replace any light bulbs as soon as they burn out.  Set up your furniture so you have a clear path. Avoid moving your furniture around.  If any of your floors are uneven, fix them.  If there are any pets around you, be aware of where they are.  Review your medicines with your doctor. Some medicines can make you feel dizzy. This can increase your chance of falling. Ask your doctor what other things that you can do to help prevent falls. This information is not intended to replace advice given to you by your health care provider. Make sure you discuss any questions you have with your health care provider. Document Released: 08/06/2009 Document Revised: 03/17/2016 Document Reviewed: 11/14/2014 Elsevier Interactive Patient Education  2017 [employer].

## 2020-12-17 NOTE — Telephone Encounter (Signed)
Placed on PCP desk to review and sign, if appropriate.  

## 2020-12-17 NOTE — Telephone Encounter (Signed)
Signed and put in box to go up front. Signed:  Crissie Sickles, MD           12/17/2020

## 2020-12-17 NOTE — Telephone Encounter (Signed)
Called patient's daughter, [name] and informed ready to pick up. Delores voiced understanding. Placed in green binder in front office, for pickup.

## 2020-12-24 ENCOUNTER — Other Ambulatory Visit: Payer: Self-pay | Admitting: Family Medicine

## 2020-12-24 DIAGNOSIS — E118 Type 2 diabetes mellitus with unspecified complications: Secondary | ICD-10-CM

## 2020-12-24 DIAGNOSIS — Z794 Long term (current) use of insulin: Secondary | ICD-10-CM

## 2021-01-27 ENCOUNTER — Ambulatory Visit (INDEPENDENT_AMBULATORY_CARE_PROVIDER_SITE_OTHER): Payer: Medicare Other | Admitting: Family Medicine

## 2021-01-27 ENCOUNTER — Other Ambulatory Visit: Payer: Self-pay

## 2021-01-27 ENCOUNTER — Encounter: Payer: Self-pay | Admitting: Family Medicine

## 2021-01-27 VITALS — BP 101/66 | HR 98 | Temp 97.4°F | Resp 16 | Ht 62.0 in | Wt 137.4 lb

## 2021-01-27 DIAGNOSIS — Z794 Long term (current) use of insulin: Secondary | ICD-10-CM | POA: Diagnosis not present

## 2021-01-27 DIAGNOSIS — I10 Essential (primary) hypertension: Secondary | ICD-10-CM | POA: Diagnosis not present

## 2021-01-27 DIAGNOSIS — E7800 Pure hypercholesterolemia, unspecified: Secondary | ICD-10-CM | POA: Diagnosis not present

## 2021-01-27 DIAGNOSIS — N183 Chronic kidney disease, stage 3 unspecified: Secondary | ICD-10-CM | POA: Diagnosis not present

## 2021-01-27 DIAGNOSIS — N2889 Other specified disorders of kidney and ureter: Secondary | ICD-10-CM

## 2021-01-27 DIAGNOSIS — E11319 Type 2 diabetes mellitus with unspecified diabetic retinopathy without macular edema: Secondary | ICD-10-CM | POA: Diagnosis not present

## 2021-01-27 DIAGNOSIS — E119 Type 2 diabetes mellitus without complications: Secondary | ICD-10-CM

## 2021-01-27 NOTE — Progress Notes (Signed)
OFFICE VISIT  01/27/2021  CC:  Chief Complaint  Patient presents with  . Follow-up    RCI,     HPI:    Patient is a 75 y.o. Caucasian female who presents accompanied by her daughter [name] for 3 mo f/u DM, HTN, HLD, and CRI III. A/P as of last visit: "1) COPD exacerbation: resolving appropriately. Needs to restart flovent so I RF'd this med.  2) DM 2, doing well on low doses of insulin. Continue this. Hba1c today.  Pt was unable to produce a urine specimen for microalbuminuria testing.  3) HLD: tolerating atorva 69m qd. FLP and hepatic panel today.  4) CRI III: avoids NSAIDs. Drinks fluids well.  5) L thigh pain, subacute---seems to be ever since she fell on L hip/leg about 8 mo ago. Check left femur x-ray.  6) Health maintenance exam: Reviewed age and gender appropriate health maintenance issues (prudent diet, regular exercise, health risks of tobacco and excessive alcohol, use of seatbelts, fire alarms in home, use of sunscreen).  Also reviewed age and gender appropriate health screening as well as vaccine recommendations. Vaccines: flu given today.  She is o/w UTD. Labs: CBC, CMET, FLP, Hba1c, urine microalb/cr. Cervical ca screening: no further cerv ca screening indicated. Breast ca screening: mammogram 04/30/20 normal.  Rpt 04/2021. Colon ca screening: 2020 colonoscopy normal.  No further colon cancer screening recommended due to age. Osteoporosis: 03/2018 DEXA with T score -2.7-->started boniva-->due for repeat DEXA at this time->ordered today"  INTERIM HX: Going well. No more pain in thigh.  X-ray last visit was neg. Breathing is good, no persistent cough or wheeze. Enjoys seeing multiple GC and GGC.  Trial off protonix after last visit resulted in too much recurrence of dyspepsia/gerd so she restarted it and is fine now. Off iron since last visit.  CRI: drinks about 65 oz fluids daily, avoids NSAIDs.  DM: 4 U 70/30 qAM and 2 q supper. Eats fairly good  diabetic diet most of the time. No hypoglycemia.  She did not bring her log book in with her today--checks gluc bid.  HTN: no home bp monitoring.  ROS as above, plus--> no fevers, no CP, no SOB, no wheezing, no cough, no dizziness, no HAs, no rashes, no melena/hematochezia.  No polyuria or polydipsia.  No myalgias or arthralgias.  No focal weakness, paresthesias, or tremors.  No acute vision or hearing abnormalities.  No dysuria or unusual/new urinary urgency or frequency.  No recent changes in lower legs. No n/v/d or abd pain.  No palpitations.    Past Medical History:  Diagnosis Date  . Age-related nuclear cataract of both eyes 2016   +cortical age related cataracts OU   . Anxiety and depression   . Bilateral diabetic retinopathy (HCC) 2015   Dr. MZigmund Daniel . Blood transfusion without reported diagnosis    when taking chemo  . Chronic renal insufficiency, stage 3 (moderate) (HCC)    GFR 50s  . 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  .Marland KitchenCOPD (chronic obstructive pulmonary disease) ([place]   . CVA (cerebral vascular accident) ([facility] 09/2016   MRI did show a left-sided small ischemic stroke which was acute but likely incidental (MRI was done b/c pt had TIA sx's in hospital).  Neuro put her on plavix at that time.  Cardiology d/c'd her plavix 08/2017.  . Diabetes mellitus with complication (HCC)    diab retinopathy OU (laser)  . Finger fracture, right 04/2020   R 5th  metacarpal fx at the base (Dr. Mardelle Matte)  . GERD (gastroesophageal reflux disease)    protonix  . History of concussion 08/25/2018   w/out loss of consciousness--mild increase in baseline memory impairment after.  CT head neg acute.  Marland Kitchen History of diverticulitis of colon    with abscess; required IR percutaneous drain placed 09/2016.  Marland Kitchen History of iron deficiency anemia    started iron 07/02/19.  EGD/colonoscopy 08/27/19 unrevealing. Capsule endoscopy considered but never done.  .  Hyperlipidemia   . Hypertension   . Left bundle branch block (LBBB) 12/16/2016  . Lung cancer ([place])    non-small cell lung ca, stage III in 05/2011; systemic chemotherapy concurrent with radiation followed by prophylactic cranial irradiation and has been observation since July of 2010 with no evidence for disease recurrence-released from onc f/u 12/2014 (needs annual cxr by PCP).  CXR 08/2015 stable.  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 Earlie Server did bx and this was NEG for malignancy.  . Mild cognitive impairment with memory loss    Likely from brain radiation therapy  . Non-obstructive CAD (coronary artery disease)    a. Cath 2006 preserved LV fxn, scattered irregularities without critical stenosis; b. 2008 stress echo negative for ischemia, but with hypertensive response; c. 08/2016 Cath: D1 25%, otw nl.  . Normocytic anemia 2018   Iron, vit B12, folate all normal 12/2016.  Marland Kitchen Open-angle glaucoma 2016   Dr. Shirley Muscat, (bilateral)---responding to topical therapy  . Osteoarthritis of both hands   . Osteoporosis 03/2016; 03/2018   03/2016 DEXA T-score -2.2.  03/2018 T-score L femoral neck  -2.7--boniva started.  . Pneumonia    hospitalization 11/2016; hypoxemic resp failure--d/c'd home with home oxygen therapy  . Seasonal allergic rhinitis   . Subclinical hyperthyroidism 2018  . Takotsubo cardiomyopathy    a. 08/2016 Echo: EF 30-35%, gr1DD, PASP 30mHg;  b. 08/2016 Cath: nonobs Dzs;  c. 11/2016 Echo: EF 40-45%, no rwma, nl RV fxn, mild TR, PASP 358mg.  ECHO 02/2017 EF 50-55%, grd II DD---essentially resolved Takotsubo 02/2017.  . [place] de pointes ([facility]   a. 08/2016 in setting of diverticulitis & pneumoperitoneum and Takotsubo CM -->prolonged QT, seen by EP-->avoid meds with potential for QT prolongation.    Past Surgical History:  Procedure Laterality Date  . ABDOMINAL HYSTERECTOMY  1997  . APPENDECTOMY    . CARDIAC CATHETERIZATION N/A 09/14/2016   Procedure: Left Heart Cath and  Coronary Angiography;  Surgeon: ThTroy SineMD;  [facility];  Service: Cardiovascular; Mild nonobstructive CAD with 85% smooth narrowing in the first diagonal branch of the LAD; 10% smooth narrowing of the ostial proximal left circumflex coronary artery; and a normal dominant RCA.  . Marland KitchenARPAL TUNNEL RELEASE    . CATARACT EXTRACTION Bilateral   . CHOLECYSTECTOMY  2002  . COLONOSCOPY  10/24/00; 08/27/19   2002 normal.  [employer] hemoccult testing via [facility] 06/18/15 was NEG.  2020->diverticulosis o/w nl.  . dexa  03/2016; 03/2018   03/2018 T-score L femoral neck  -2.7 (worsened compared to osteopenic range 2017.)  . ESOPHAGOGASTRODUODENOSCOPY  08/27/2019   Normal.  . IR GENERIC HISTORICAL  09/19/2016   IR SINUS/FIST TUBE CHK-NON GI 09/19/2016 JaCorrie MckusickDO [facility]  . IR GENERIC HISTORICAL  10/06/2016   IR RADIOLOGIST EVAL & MGMT 10/06/2016 [facility]  . IR GENERIC HISTORICAL  10/26/2016   IR RADIOLOGIST EVAL & MGMT 10/26/2016 [facility]  . IR GENERIC HISTORICAL  11/03/2016  IR RADIOLOGIST EVAL & MGMT 11/03/2016 Ardis Rowan, PA-C [facility]  . IR GENERIC HISTORICAL  11/24/2016   IR RADIOLOGIST EVAL & MGMT 11/24/2016 Ardis Rowan, PA-C [facility]  . IR GENERIC HISTORICAL  12/05/2016   Fistula smaller/improving.  IR SINUS/FIST TUBE CHK-NON GI 12/05/2016 Sandi Mariscal, MD [facility]  . IR GENERIC HISTORICAL  12/22/2016   IR RADIOLOGIST EVAL & MGMT 12/22/2016 Greggory Keen, MD [facility]  . IR RADIOLOGIST EVAL & MGMT  01/10/2017  . IR RADIOLOGIST EVAL & MGMT  02/09/2017  . IR SINUS/FIST TUBE CHK-NON GI  03/28/2017  . LEFT HEART CATHETERIZATION WITH CORONARY ANGIOGRAM N/A 05/30/2012   Procedure: LEFT HEART CATHETERIZATION WITH CORONARY ANGIOGRAM;  Surgeon: Burnell Blanks, MD;  Location[facility] C[facility] LAB;  Service: Cardiovascular: Mild, non-obstructive CAD  . TRANSTHORACIC ECHOCARDIOGRAM  09/14/2016; 11/2016   08/2016: EF 30-35 %, Akinesis of  the mid-apicalanteroseptal myocardium.  Grade I DD.  Mild pulm HTN.  Repeat echo 11/2016, EF 40-45%, no rwma, nl RV fxn, mild TR, PASP 68mHg.    .Marland KitchenTRANSTHORACIC ECHOCARDIOGRAM  03/07/2017   EF 50-55%. Hypokinesis of the distal septum with overall low normal LV,  systolic function; mild diastolic dysfunction with elevated LV  filling pressure; mildly calcified aortic valve with mild AI; small pericardial effusion    Outpatient Medications Prior to Visit  Medication Sig Dispense Refill  . albuterol (PROVENTIL) (2.5 MG/3ML) 0.083% nebulizer solution USE 1 VIAL IN NEBULIZER EVERY 4 HOURS AS NEEDED FOR WHEEZING AND FOR SHORTNESS OF BREATH 75 mL 0  . albuterol (VENTOLIN HFA) 108 (90 Base) MCG/ACT inhaler INHALE 2 PUFFS BY MOUTH EVERY 6 HOURS AS NEEDED FOR WHEEZING OR SHORTNESS OF BREATH 18 g 1  . aspirin 81 MG EC tablet Take 81 mg by mouth daily.     .Marland Kitchenatorvastatin (LIPITOR) 40 MG tablet Take 1 tablet by mouth once daily 90 tablet 1  . buPROPion (WELLBUTRIN XL) 150 MG 24 hr tablet Take 1 tablet by mouth once daily 90 tablet 1  . cetirizine (ZYRTEC) 10 MG tablet Take 10 mg by mouth daily.    .Marland Kitchenescitalopram (LEXAPRO) 20 MG tablet TAKE 1 TABLET BY MOUTH ONCE DAILY . 90 tablet 1  . fluticasone (FLOVENT HFA) 220 MCG/ACT inhaler Inhale 2 puffs into the lungs 2 (two) times daily. 12 g 11  . glucose blood (ONETOUCH ULTRA) test strip USE 1 STRIP TO CHECK GLUCOSE THREE TIMES DAILY 100 each 5  . ibandronate (BONIVA) 150 MG tablet Take 1 tablet (150 mg total) by mouth every 30 (thirty) days. Take in the morning with a full glass of water, on an empty stomach, and do not take anything else by mouth or lie down for the next 30 min. 3 tablet 3  . insulin isophane & regular human (HUMULIN 70/30 KWIKPEN) (70-30) 100 UNIT/ML KwikPen INJECT 4 UNITS SUBCUTANEOUSLY WITH BREAKFAST AND 2 UNITS WITH SUPPER 15 mL 1  . Lancets (ONETOUCH DELICA PLUS [id number] MISC USE 1  TO CHECK GLUCOSE THREE TIMES DAILY 100 each 5  .  metoprolol tartrate (LOPRESSOR) 25 MG tablet 1/2 tab po bid 90 tablet 3  . Multiple Vitamins-Minerals (MULTIVITAMIN WOMEN PO) Take by mouth daily.    . pantoprazole (PROTONIX) 40 MG tablet Take 1 tablet (40 mg total) by mouth daily. 90 tablet 1  . Probiotic Product (PROBIOTIC PO) Take by mouth daily.    . ferrous sulfate 325 (65 FE) MG EC tablet Take 325 mg by  mouth 2 (two) times daily. (Patient not taking: No sig reported)     No facility-administered medications prior to visit.    Allergies  Allergen Reactions  . Lactose Intolerance (Gi) Diarrhea  . Ibuprofen Other (See Comments)    REACTION: Anxiousness, hyperventilates    ROS As per HPI  PE: Vitals with BMI 01/27/2021 12/16/2020 10/28/2020  Height _0  _1  _2   Weight 137 lbs 6 oz 139 lbs 136 lbs  BMI 25.12 35.00 93.81  Systolic 829 937 97  Diastolic 66 78 61  Pulse 98 92 91  O2 sat on RA today is 94%   Gen: Alert, well appearing.  Patient is oriented to person, place, time, and situation. AFFECT: pleasant, lucid thought and speech. CV: RRR, no m/r/g.   LUNGS: CTA bilat, nonlabored resps, good aeration in all lung fields. EXT: no clubbing or cyanosis.  no edema.    LABS:  Lab Results  Component Value Date   TSH 1.24 04/20/2017   Lab Results  Component Value Date   WBC 5.9 10/28/2020   HGB 13.1 10/28/2020   HCT 39.9 10/28/2020   MCV 90.0 10/28/2020   PLT 191.0 10/28/2020   Lab Results  Component Value Date   CREATININE 0.93 10/28/2020   BUN 16 10/28/2020   NA 137 10/28/2020   K 4.3 10/28/2020   CL 100 10/28/2020   CO2 31 10/28/2020   Lab Results  Component Value Date   ALT 17 10/28/2020   AST 19 10/28/2020   ALKPHOS 68 10/28/2020   BILITOT 0.3 10/28/2020   Lab Results  Component Value Date   CHOL 130 10/28/2020   Lab Results  Component Value Date   HDL 43.00 10/28/2020   Lab Results  Component Value Date   LDLCALC 71 10/28/2020   Lab Results  Component Value Date   TRIG 79.0  10/28/2020   Lab Results  Component Value Date   CHOLHDL 3 10/28/2020   Lab Results  Component Value Date   HGBA1C 7.1 (H) 10/28/2020    IMPRESSION AND PLAN:  1) DM, well controlled on low doses of 70/30 insulin bid. Hba1c and urine microalb/cr today. Lytes/cr today.  2) HTN: doing well. Cont 1/2 51m lopressor bid. Lytes/cr today.  3) CRI III: avoids NSAIDs.  Hydrates well. Lytes/cr today.  4) HLD: tolerating atorva 427mqd. LDL 71 three mo ago. Plan recheck lipids 9 mo.  5) Preventative health care:  Mammogram and DEXA set up for 04/2021.  An After Visit Summary was printed and given to the patient.  FOLLOW UP: Return in about 3 months (around 04/28/2021) for routine chronic illness f/u. Next cpe 10/2021  Signed:  PhCrissie SicklesMD           01/27/2021

## 2021-01-29 NOTE — Addendum Note (Signed)
Addended by: Octaviano Glow on: 01/29/2021 02:06 PM   Modules accepted: Orders

## 2021-01-30 LAB — HEMOGLOBIN A1C
Hgb A1c MFr Bld: 6.4 % of total Hgb — ABNORMAL HIGH (ref <5.7)
Mean Plasma Glucose: 137 mg/dL
eAG (mmol/L): 7.6 mmol/L

## 2021-01-30 LAB — BASIC METABOLIC PANEL
BUN/Creatinine Ratio: 23 (calc) — ABNORMAL HIGH (ref 6–22)
BUN: 22 mg/dL (ref 7–25)
CO2: 26 mmol/L (ref 20–32)
Calcium: 9.1 mg/dL (ref 8.6–10.4)
Chloride: 104 mmol/L (ref 98–110)
Creat: 0.95 mg/dL — ABNORMAL HIGH (ref 0.60–0.93)
Glucose, Bld: 98 mg/dL (ref 65–99)
Potassium: 4.4 mmol/L (ref 3.5–5.3)
Sodium: 139 mmol/L (ref 135–146)

## 2021-02-06 ENCOUNTER — Other Ambulatory Visit: Payer: Self-pay | Admitting: Family Medicine

## 2021-02-12 ENCOUNTER — Other Ambulatory Visit: Payer: Self-pay | Admitting: Family Medicine

## 2021-03-03 IMAGING — MG DIGITAL SCREENING BILAT W/ TOMO W/ CAD
6 of 12 series · 6 of 36 positions shown · non-contrast
Comparison: Previous exam(s).

CLINICAL DATA: Screening.

EXAM:
DIGITAL SCREENING BILATERAL MAMMOGRAM WITH TOMO AND CAD

[L MLO synth-2D (1 of 2)]
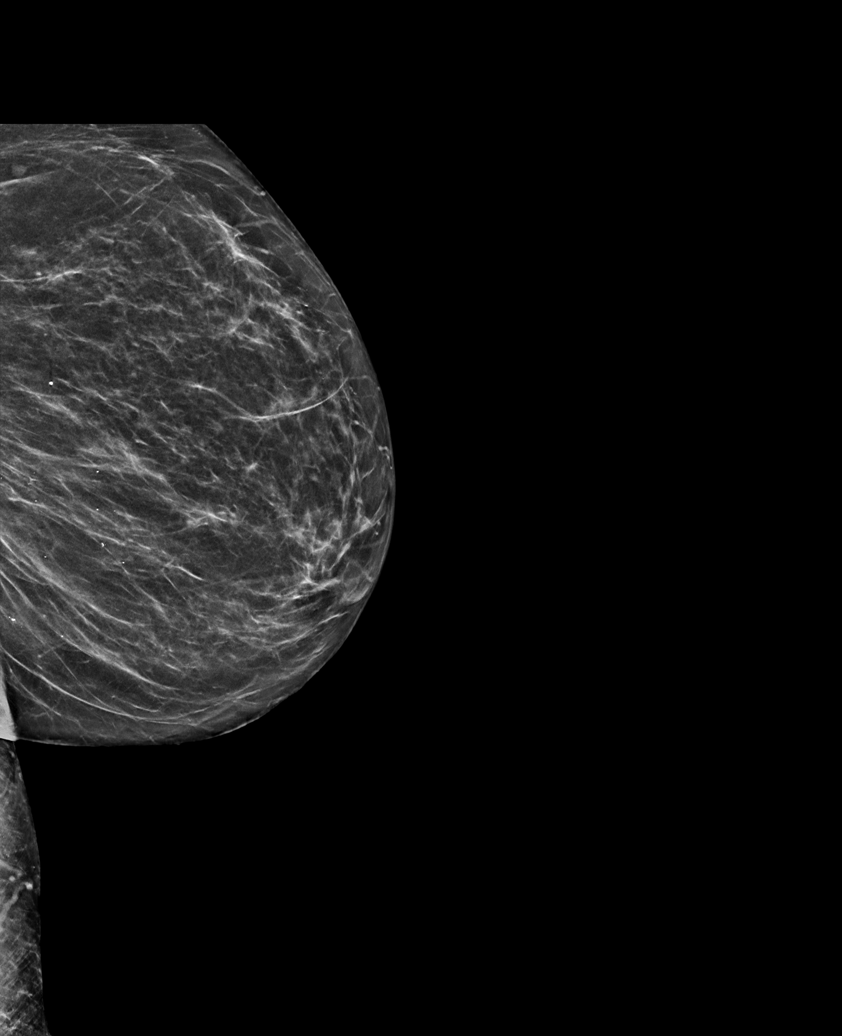

[R MLO synth-2D (1 of 2)]
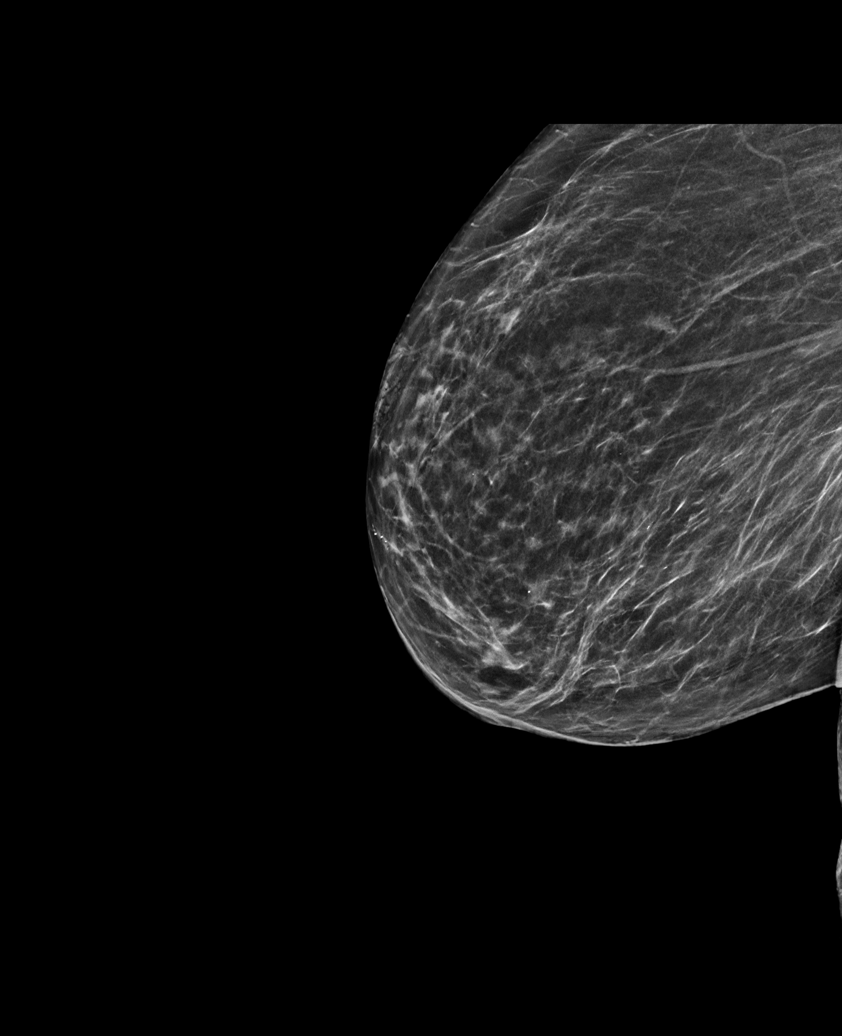

[L MLO synth-2D (2 of 2)]
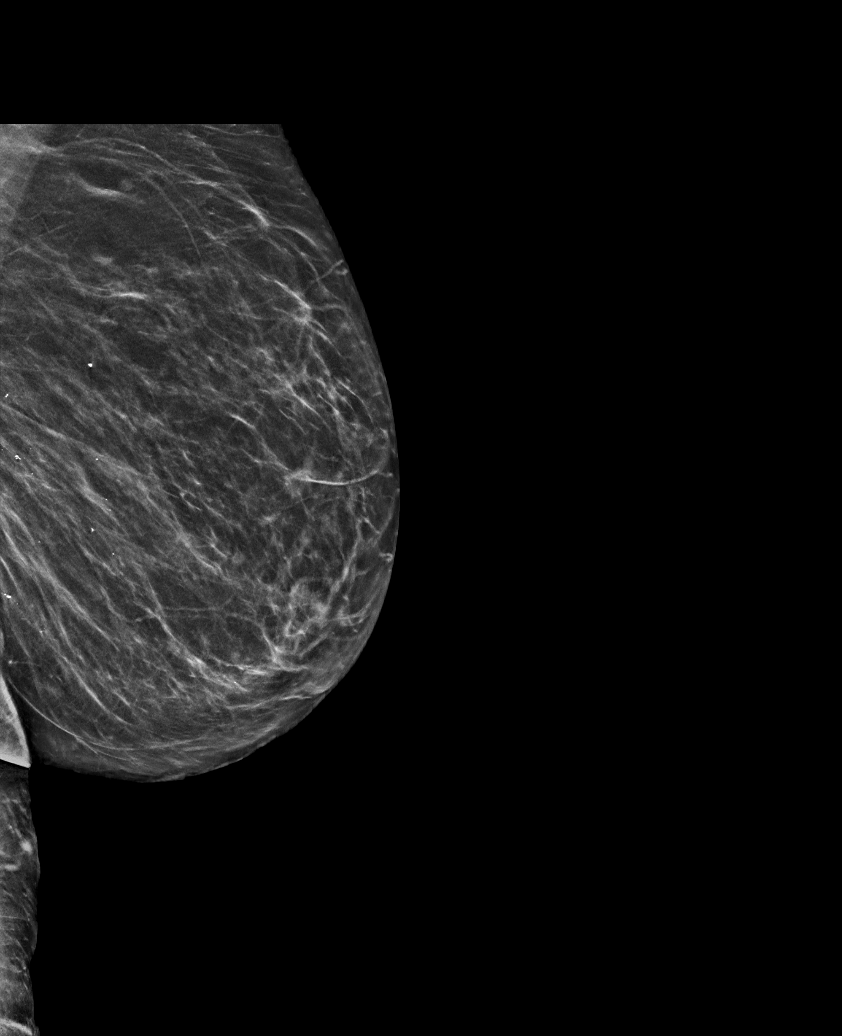

[L CC synth-2D]
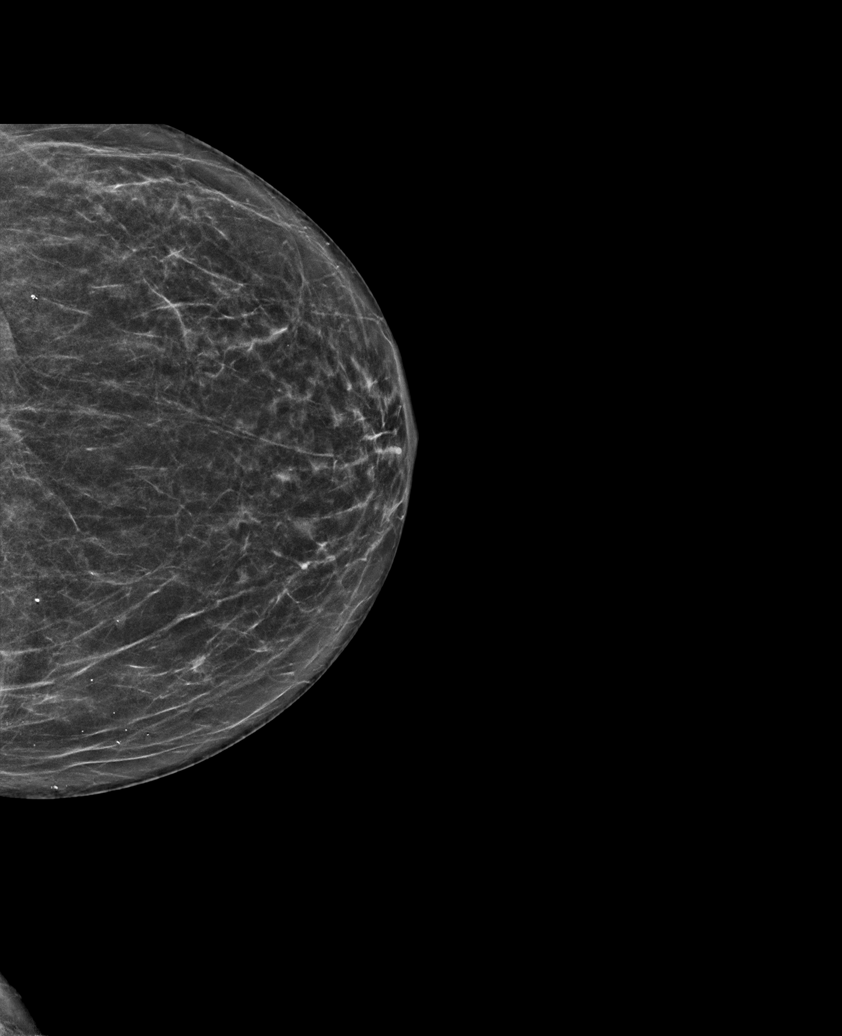

[R MLO synth-2D (2 of 2)]
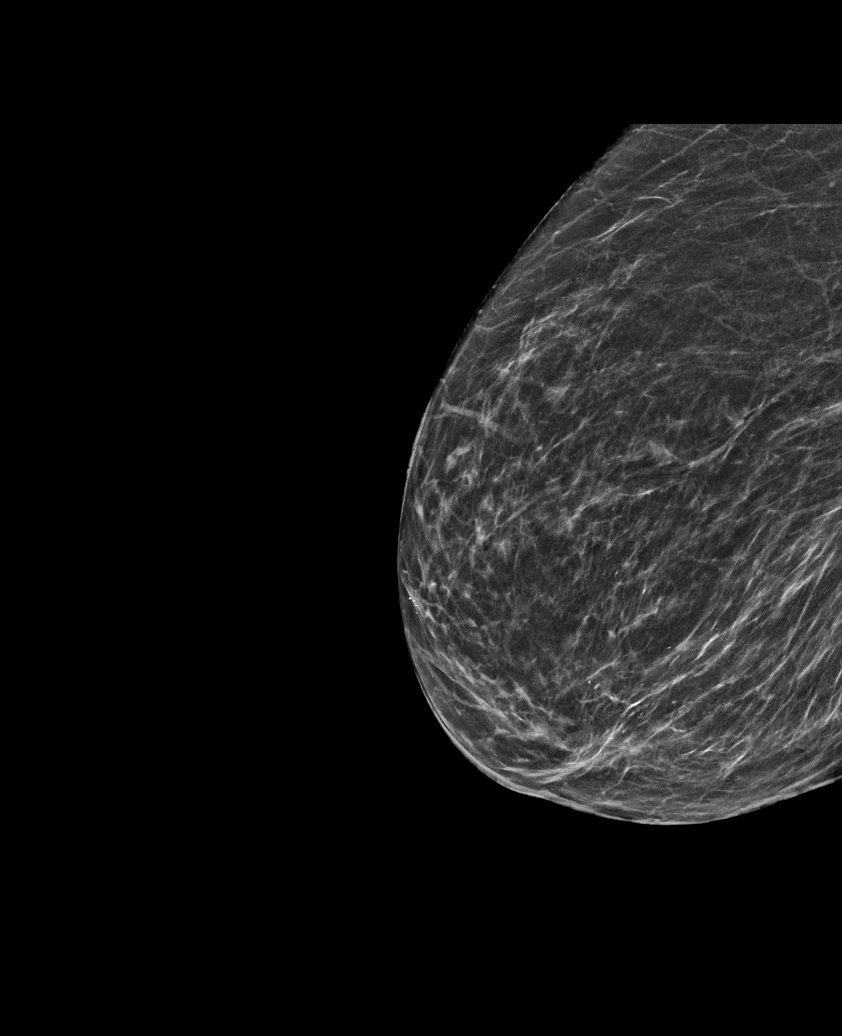

[R CC synth-2D]
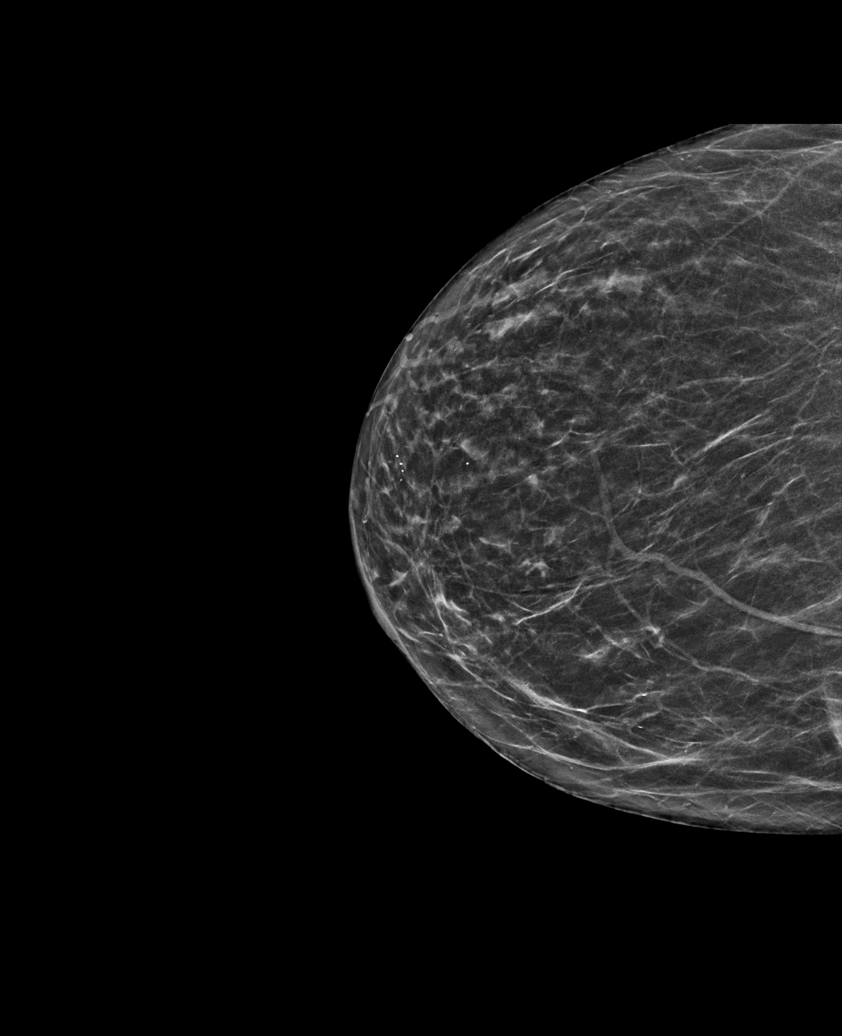

[6 of 36 positions shown; findings below may reference images not displayed]

ACR Breast Density Category b: There are scattered areas of
fibroglandular density.
FINDINGS: There are no findings suspicious for malignancy. Images were
processed with CAD.
IMPRESSION: No mammographic evidence of malignancy. A result letter of this
screening mammogram will be mailed directly to the patient.

RECOMMENDATION:
Screening mammogram in one year. (Code:[id number])

BI-RADS CATEGORY  1: Negative.

## 2021-03-11 ENCOUNTER — Other Ambulatory Visit: Payer: Self-pay | Admitting: *Deleted

## 2021-03-11 NOTE — Patient Outreach (Signed)
[facility]) Care Management  03/11/2021  [name] 29-Jul-1946 [id number]   Referral Date: 5/11 Referral Source: Insurance Referral Reason: Chronic case management Insurance: [facility]   Outreach attempt #1, unsuccessful, unable to leave a voice message.  Plan: RN CM will send outreach letter and follow up within the next 3-4 business days.  [name], [place], MSN [place] [phone]

## 2021-03-17 ENCOUNTER — Other Ambulatory Visit: Payer: Self-pay | Admitting: *Deleted

## 2021-03-17 NOTE — Patient Outreach (Signed)
[facility]) Care Management  03/17/2021  [name] 1946/10/02 [id number]   Referral Date: 5/11 Referral Source: Insurance Referral Reason: Chronic case management Insurance: [facility]   Outreach attempt #2, unsuccessful, unable to leave a voice message.  Plan: RN CM will follow up within the next 3-4 business days.  [name], [place], MSN [place] [phone]

## 2021-03-23 ENCOUNTER — Telehealth: Payer: Self-pay

## 2021-03-23 ENCOUNTER — Other Ambulatory Visit: Payer: Self-pay | Admitting: *Deleted

## 2021-03-23 DIAGNOSIS — R2681 Unsteadiness on feet: Secondary | ICD-10-CM

## 2021-03-23 DIAGNOSIS — R5381 Other malaise: Secondary | ICD-10-CM

## 2021-03-23 NOTE — Telephone Encounter (Signed)
Please advise, thanks.

## 2021-03-23 NOTE — Telephone Encounter (Signed)
Monica calling from [facility]; states that patient's walker is broken. Needs order for a new one. Because of the weakness in her legs, and declining strength also requests in need of walker  Please call Monica at [phone] for more information

## 2021-03-23 NOTE — Patient Outreach (Signed)
[facility]) Care Management  [place]  03/25/2021   [name] 03-16-1946 [id number]  Referral Date:5/11 Referral Source:Insurance Referral Reason:Chronic case management Insurance:[employer]  Outreach attempt #3, successful to daughter.  Identity verified.  This care manager introduced self and stated purpose of call.  [facility] care management services explained.    Social: Member lives with daughter and grandson.  Daughter report member has been losing strength in her legs and needing more assistance from them to get up/down from chair and bed.  She used to have a walker but that is now broken, requesting a new one.  Member has had PT in the past, daughter feels she may benefit from having again.    She is concerned with member's incontinence, does not have her on a routine toilet schedule.  Daughter is a Pharmacist, hospital, last day of school is this Friday, will try to adapt a toileting schedule next week.  Conditions: Per chart, has history of cardiomyopathy, HTN, CHF, CAD, DM (A1C - 6.4), CVA, COPD, GERD, and HLD.  Daughter also report some memory loss.  Medications: Reviewed with daughter, denies need for financial assistance.   Appointments:  Daughter grandson provides transportation, 6/3 with eye doctor and 6/27 with PCP.  Advance Directives: Daughter denies having, receptive to paperwork.  Consent: Daughter grants permission to be involved with [facility].  Encounter Medications:  Outpatient Encounter Medications as of 03/23/2021  Medication Sig Note  . albuterol (PROVENTIL) (2.5 MG/3ML) 0.083% nebulizer solution USE 1 VIAL IN NEBULIZER EVERY 4 HOURS AS NEEDED FOR WHEEZING AND FOR SHORTNESS OF BREATH   . albuterol (VENTOLIN HFA) 108 (90 Base) MCG/ACT inhaler INHALE 2 PUFFS BY MOUTH EVERY 6 HOURS AS NEEDED FOR WHEEZING OR SHORTNESS OF BREATH   . aspirin 81 MG EC tablet Take 81 mg by mouth daily.    Marland Kitchen atorvastatin (LIPITOR) 40 MG tablet Take 1 tablet by mouth once  daily   . buPROPion (WELLBUTRIN XL) 150 MG 24 hr tablet Take 1 tablet by mouth once daily   . cetirizine (ZYRTEC) 10 MG tablet Take 10 mg by mouth daily.   Marland Kitchen escitalopram (LEXAPRO) 20 MG tablet Take 1 tablet by mouth once daily   . fluticasone (FLOVENT HFA) 220 MCG/ACT inhaler Inhale 2 puffs into the lungs 2 (two) times daily.   Marland Kitchen glucose blood (ONETOUCH ULTRA) test strip USE 1 STRIP TO CHECK GLUCOSE THREE TIMES DAILY   . ibandronate (BONIVA) 150 MG tablet Take 1 tablet (150 mg total) by mouth every 30 (thirty) days. Take in the morning with a full glass of water, on an empty stomach, and do not take anything else by mouth or lie down for the next 30 min. 10/07/2019: Per daughter, patient is due for her next dose now  . insulin isophane & regular human (HUMULIN 70/30 KWIKPEN) (70-30) 100 UNIT/ML KwikPen INJECT 4 UNITS SUBCUTANEOUSLY WITH BREAKFAST AND 2 UNITS WITH SUPPER   . Lancets (ONETOUCH DELICA PLUS [id number]) MISC USE 1  TO CHECK GLUCOSE THREE TIMES DAILY   . metoprolol tartrate (LOPRESSOR) 25 MG tablet 1/2 tab po bid   . Multiple Vitamins-Minerals (MULTIVITAMIN WOMEN PO) Take by mouth daily.   . pantoprazole (PROTONIX) 40 MG tablet Take 1 tablet by mouth once daily    No facility-administered encounter medications on file as of 03/23/2021.    Functional Status:  In your present state of health, do you have any difficulty performing the following activities: 12/16/2020  Hearing? N  Vision? N  Difficulty concentrating or making decisions? 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 Y  Dressing or bathing? N  Doing errands, shopping? Y  Preparing Food and eating ? Y  Using the Toilet? N  In the past six months, have you accidently leaked urine? Y  Comment wears depends  Do you have problems with loss of bowel control? N  Managing your Medications? Y  Managing your Finances? Y  Housekeeping or managing your Housekeeping? Y  Some  recent data might be hidden    Fall/Depression Screening: Fall Risk  03/23/2021 12/16/2020 10/28/2020  Falls in the past year? 0 1 1  Comment - - -  Number falls in past yr: 0 0 1  Comment - - -  Injury with Fall? 0 0 1  Risk Factor Category  - - -  Risk for fall due to : Impaired balance/gait - -  Risk for fall due to: Comment - - -  Follow up - Falls prevention discussed Falls evaluation completed  Comment - - -   PHQ 2/9 Scores 03/23/2021 12/16/2020 08/29/2019 08/09/2019 04/19/2019 12/14/2018 12/13/2018  PHQ - 2 Score - 0 1 3 3 1 3   PHQ- 9 Score - - 9 9 8  - 19  Exception Documentation Other- indicate reason in comment box - - - - - -    Assessment:  Goals Addressed            This Visit's Progress   . [facility] - Make and Keep All Appointments       Timeframe:  Short-Term Goal Priority:  Medium Start Date:          5/31                   Expected End Date:       6/30                Follow Up Date 6/13   - call to cancel if needed - keep a calendar with appointment dates    Why is this important?    Part of staying healthy is seeing the doctor for follow-up care.   If you forget your appointments, there are some things you can do to stay on track.    Notes:   5/31- Reviewed appointments with daughter, 6/3 with eye doctor and 6/27 with PCP    . [facility] - Prevent Falls and Injury       Follow Up Date 6/13  Timeframe:  Long-Range Goal Priority:  High Start Date:     5/31                        Expected End Date:        8/31               Barriers: Other - decreased strength and broken DME    - make an emergency alert plan in case I fall - use a cane or walker - use a nightlight in the bathroom    Why is this important?    Most falls happen when it is hard for you to walk safely. Your balance may be off because of an illness. You may have pain in your knees, hip or other joints.   You may be overly tired or taking medicines that make you sleepy. You may not be able to  see or hear clearly.   Falls  can lead to broken bones, bruises or other injuries.   There are things you can do to help prevent falling.     Notes:   5/31 - Call placed to PCP office to request orders for new walker and home health PT       Plan:  Follow-up:  Patient agrees to Care Plan and Follow-up.  Will send daughter education regarding toilet schedule and Advanced Live Planning.  Will contact PCP office for PT and walker orders.  Will notify PCP of involvement.  Will follow up with daughter within the next 2 weeks.  [name], [place], MSN [place] [phone]

## 2021-03-24 ENCOUNTER — Other Ambulatory Visit: Payer: Self-pay

## 2021-03-24 NOTE — Telephone Encounter (Signed)
LM for [facility] to return call regarding rx for walker. Would like to know if she knew where pt wanted walker rx sent

## 2021-03-24 NOTE — Telephone Encounter (Signed)
Spoke with [facility] regarding rx and she advised we could send rx to [facility]. Pt's daughter's [name] was made aware rx would be faxed and could let us know if DME too expensive thru [facility]

## 2021-03-24 NOTE — Telephone Encounter (Signed)
Spoke with pt's daughter, [name] and she does not know where walker would be sent to. Will wait for return call from [facility], tried calling again but no answer. Message already left earlier

## 2021-03-24 NOTE — Telephone Encounter (Signed)
Pls rx 4 point rolling walker, dx unsteady gait and debilitated patient.-thx

## 2021-03-25 ENCOUNTER — Encounter: Payer: Self-pay | Admitting: *Deleted

## 2021-03-25 NOTE — Patient Instructions (Signed)
Critical care medicine: Principles of diagnosis and management in the adult (4th ed., pp. 2952-8413). Saunders."> Miller's anesthesia (8th ed., pp. 232-250). Saunders.">  Advance Directive  Advance directives are legal documents that allow you to make decisions about your health care and medical treatment in case you become unable to communicate for yourself. Advance directives let your wishes be known to family, friends, and health care providers. Discussing and writing advance directives should happen over time rather than all at once. Advance directives can be changed and updated at any time. There are different types of advance directives, such as:  Medical power of attorney.  Living will.  Do not resuscitate (DNR) order or do not attempt resuscitation (DNAR) order. Health care proxy and medical power of attorney A health care proxy is also called a health care agent. This person is appointed to make medical decisions for you when you are unable to make decisions for yourself. Generally, people ask a trusted friend or family member to act as their proxy and represent their preferences. Make sure you have an agreement with your trusted person to act as your proxy. A proxy may have to make a medical decision on your behalf if your wishes are not known. A medical power of attorney, also called a durable power of attorney for health care, is a legal document that names your health care proxy. Depending on the laws in your state, the document may need to be:  Signed.  Notarized.  Dated.  Copied.  Witnessed.  Incorporated into your medical record. You may also want to appoint a trusted person to manage your money in the event you are unable to do so. This is called a durable power of attorney for finances. It is a separate legal document from the durable power of attorney for health care. You may choose your health care proxy or someone different to act as your agent in money matters. If you  do not appoint a proxy, or there is a concern that the proxy is not acting in your best interest, a court may appoint a guardian to act on your behalf. Living will A living will is a set of instructions that state your wishes about medical care when you cannot express them yourself. Health care providers should keep a copy of your living will in your medical record. You may want to give a copy to family members or friends. To alert caregivers in case of an emergency, you can place a card in your wallet to let them know that you have a living will and where they can find it. A living will is used if you become:  Terminally ill.  Disabled.  Unable to communicate or make decisions. The following decisions should be included in your living will:  To use or not to use life support equipment, such as dialysis machines and breathing machines (ventilators).  Whether you want a DNR or DNAR order. This tells health care providers not to use cardiopulmonary resuscitation (CPR) if breathing or heartbeat stops.  To use or not to use tube feeding.  To be given or not to be given food and fluids.  Whether you want comfort (palliative) care when the goal becomes comfort rather than a cure.  Whether you want to donate your organs and tissues. A living will does not give instructions for distributing your money and property if you should pass away. DNR or DNAR A DNR or DNAR order is a request not to have CPR in  the event that your heart stops beating or you stop breathing. If a DNR or DNAR order has not been made and shared, a health care provider will try to help any patient whose heart has stopped or who has stopped breathing. If you plan to have surgery, talk with your health care provider about how your DNR or DNAR order will be followed if problems occur. What if I do not have an advance directive? Some states assign family decision makers to act on your behalf if you do not have an advance directive.  Each state has its own laws about advance directives. You may want to check with your health care provider, attorney, or state representative about the laws in your state. Summary  Advance directives are legal documents that allow you to make decisions about your health care and medical treatment in case you become unable to communicate for yourself.  The process of discussing and writing advance directives should happen over time. You can change and update advance directives at any time.  Advance directives may include a medical power of attorney, a living will, and a DNR or DNAR order. This information is not intended to replace advice given to you by your health care provider. Make sure you discuss any questions you have with your health care provider. Document Revised: 07/14/2020 Document Reviewed: 07/14/2020 Elsevier Patient Education  2021 [employer].

## 2021-03-26 ENCOUNTER — Encounter (INDEPENDENT_AMBULATORY_CARE_PROVIDER_SITE_OTHER): Payer: Medicare Other | Admitting: Ophthalmology

## 2021-03-31 ENCOUNTER — Other Ambulatory Visit: Payer: Self-pay

## 2021-03-31 ENCOUNTER — Encounter (INDEPENDENT_AMBULATORY_CARE_PROVIDER_SITE_OTHER): Payer: Medicare Other | Admitting: Ophthalmology

## 2021-03-31 DIAGNOSIS — E113512 Type 2 diabetes mellitus with proliferative diabetic retinopathy with macular edema, left eye: Secondary | ICD-10-CM | POA: Diagnosis not present

## 2021-03-31 DIAGNOSIS — H35033 Hypertensive retinopathy, bilateral: Secondary | ICD-10-CM | POA: Diagnosis not present

## 2021-03-31 DIAGNOSIS — I10 Essential (primary) hypertension: Secondary | ICD-10-CM | POA: Diagnosis not present

## 2021-03-31 DIAGNOSIS — H35372 Puckering of macula, left eye: Secondary | ICD-10-CM

## 2021-03-31 DIAGNOSIS — E113591 Type 2 diabetes mellitus with proliferative diabetic retinopathy without macular edema, right eye: Secondary | ICD-10-CM | POA: Diagnosis not present

## 2021-03-31 DIAGNOSIS — H43813 Vitreous degeneration, bilateral: Secondary | ICD-10-CM | POA: Diagnosis not present

## 2021-03-31 DIAGNOSIS — N179 Acute kidney failure, unspecified: Secondary | ICD-10-CM | POA: Diagnosis not present

## 2021-03-31 LAB — HM DIABETES EYE EXAM

## 2021-04-05 ENCOUNTER — Other Ambulatory Visit: Payer: Self-pay | Admitting: *Deleted

## 2021-04-05 ENCOUNTER — Telehealth: Payer: Self-pay

## 2021-04-05 NOTE — Telephone Encounter (Signed)
Yes ok for PT eval

## 2021-04-05 NOTE — Patient Outreach (Signed)
[facility]) Care Management  04/05/2021  [name] 03/31/1946 [id number]   Outgoing call placed to daughter, state member is doing well.  Still having issues with incontinence, has not tried bladder training but will do so.  Denies any urgent concerns, encouraged to contact this care manager with questions.  Agrees to follow up within the next month.   Goals Addressed             This Visit's Progress    [facility] - Make and Keep All Appointments   On track    Timeframe:  Short-Term Goal Priority:  Medium Start Date:          5/31                   Expected End Date:       6/30                Follow Up Date 6/13   - call to cancel if needed - keep a calendar with appointment dates    Why is this important?   Part of staying healthy is seeing the doctor for follow-up care.  If you forget your appointments, there are some things you can do to stay on track.    Notes:   5/31- Reviewed appointments with daughter, 6/3 with eye doctor and 6/27 with PCP  6/13 - Reminded daughter of PCP appointment scheduled for 6/27.        [facility] - Prevent Falls and Injury   On track    Follow Up Date 6/13  Timeframe:  Long-Range Goal Priority:  High Start Date:     5/31                        Expected End Date:        8/31               Barriers: Other - decreased strength and broken DME    - make an emergency alert plan in case I fall - use a cane or walker - use a nightlight in the bathroom    Why is this important?   Most falls happen when it is hard for you to walk safely. Your balance may be off because of an illness. You may have pain in your knees, hip or other joints.  You may be overly tired or taking medicines that make you sleepy. You may not be able to see or hear clearly.  Falls can lead to broken bones, bruises or other injuries.  There are things you can do to help prevent falling.     Notes:   5/31 - Call placed to PCP office to request  orders for new walker and home health PT  6/13 - Confirmed member has new walker, call placed to PCP office to request referral for home health PT        [name], RN, MSN [name] Manager [phone]

## 2021-04-05 NOTE — Telephone Encounter (Signed)
[place] calling about patient. Patient has rec'd walker.  Daughter is still concerned about her strength.  Requesting PT evaluation to see if will help gain strength. Per Daughter any home care agency is fine with them.  Brayton Layman can be reached at [place].  If unable, okay to leave voicemail.

## 2021-04-05 NOTE — Telephone Encounter (Signed)
Okay for PT evaluation?

## 2021-04-06 ENCOUNTER — Other Ambulatory Visit: Payer: Self-pay

## 2021-04-06 DIAGNOSIS — R2681 Unsteadiness on feet: Secondary | ICD-10-CM

## 2021-04-06 DIAGNOSIS — R5381 Other malaise: Secondary | ICD-10-CM

## 2021-04-06 NOTE — Telephone Encounter (Signed)
HH PT evaluation referral entered; pt's daughter, [name](DPR) made aware.

## 2021-04-09 ENCOUNTER — Telehealth: Payer: Self-pay | Admitting: Family Medicine

## 2021-04-09 NOTE — Telephone Encounter (Signed)
Caller: Kerrin Champagne) Call back # [phone]  They will not be able to accept referral due to staffing.

## 2021-04-19 ENCOUNTER — Other Ambulatory Visit: Payer: Self-pay

## 2021-04-19 ENCOUNTER — Ambulatory Visit (INDEPENDENT_AMBULATORY_CARE_PROVIDER_SITE_OTHER): Payer: Medicare Other | Admitting: Family Medicine

## 2021-04-19 ENCOUNTER — Encounter: Payer: Self-pay | Admitting: Family Medicine

## 2021-04-19 VITALS — BP 95/65 | HR 87 | Ht 62.0 in | Wt 130.0 lb

## 2021-04-19 DIAGNOSIS — Z794 Long term (current) use of insulin: Secondary | ICD-10-CM | POA: Diagnosis not present

## 2021-04-19 DIAGNOSIS — J449 Chronic obstructive pulmonary disease, unspecified: Secondary | ICD-10-CM | POA: Diagnosis not present

## 2021-04-19 DIAGNOSIS — I10 Essential (primary) hypertension: Secondary | ICD-10-CM | POA: Diagnosis not present

## 2021-04-19 DIAGNOSIS — E7800 Pure hypercholesterolemia, unspecified: Secondary | ICD-10-CM | POA: Diagnosis not present

## 2021-04-19 DIAGNOSIS — N183 Chronic kidney disease, stage 3 unspecified: Secondary | ICD-10-CM

## 2021-04-19 DIAGNOSIS — F688 Other specified disorders of adult personality and behavior: Secondary | ICD-10-CM

## 2021-04-19 DIAGNOSIS — N2889 Other specified disorders of kidney and ureter: Secondary | ICD-10-CM

## 2021-04-19 DIAGNOSIS — E11319 Type 2 diabetes mellitus with unspecified diabetic retinopathy without macular edema: Secondary | ICD-10-CM

## 2021-04-19 DIAGNOSIS — R413 Other amnesia: Secondary | ICD-10-CM | POA: Diagnosis not present

## 2021-04-19 NOTE — Progress Notes (Signed)
OFFICE VISIT  04/21/2021  CC: f/u DM, HTN, COPD, CRI III   HPI:    Patient is a 75 y.o. Caucasian female who presents accompanied by her daughter [name] for 3 mo f/u DM, HTN, and COPD. A/P as of last visit: "1) DM, well controlled on low doses of 70/30 insulin bid. Hba1c and urine microalb/cr today. Lytes/cr today.   2) HTN: doing well. Cont 1/2 91m lopressor bid. Lytes/cr today.   3) CRI III: avoids NSAIDs.  Hydrates well. Lytes/cr today.   4) HLD: tolerating atorva 49mqd. LDL 71 three mo ago. Plan recheck lipids 9 mo.   5) Preventative health care: Mammogram and DEXA set up for 04/2021."  INTERIM HX: Apparently she is quite mean spirited to everyone at home.  Has been gradually building up.  Clams up when family tries to talk about it with her.   Irritable.  No crying spells, no sadness, no hopelessness.  Memory impairment continues to progress.   No recent change in meds.  Home glucoses excellent, no hypoglycemia.  Usually rare need for albuterol.    No home bp monitoring. She has unsteadiness on her feet upon standing still and with ambulation--chronic.    Mammogram and DEXA are scheduled.  ROS as above, plus--> no fevers, no CP, no SOB, no wheezing, no cough, no dizziness, no HAs, no rashes, no melena/hematochezia.  No polyuria or polydipsia.  No myalgias or arthralgias.  No focal weakness, paresthesias, or tremors.  No acute vision or hearing abnormalities.  No dysuria or unusual/new urinary urgency or frequency.  No recent changes in lower legs. No n/v/d or abd pain.  No palpitations.     Past Medical History:  Diagnosis Date   Age-related nuclear cataract of both eyes 2016   +cortical age related cataracts OU    Anxiety and depression    Bilateral diabetic retinopathy ([facility]   Dr. 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   COPD (chronic obstructive pulmonary disease) (HCC)    [place]cere[place] vascular accident) [place]   MRI did show a left-sided small ischemic stroke which was acute but likely incidental (MRI was done b/c pt had TIA sx's in hospital).  Neuro put her on plavix at that time.  Cardiology d/c'd her plavix 08/2017.   Diabetes mellitus with complication (HCC)    diab retinopathy OU (laser)   Finger fracture, right 04/2020   R 5th metacarpal fx at the base (Dr. LaMardelle Matte  GERD (gastroesophageal reflux disease)    protonix   History of concussion 08/25/2018   w/out loss of consciousness--mild increase in baseline memory impairment after.  CT head neg acute.   History of diverticulitis of colon    with abscess; required IR percutaneous drain placed 09/2016.   History of iron deficiency anemia    started iron 07/02/19.  EGD/colonoscopy 08/27/19 unrevealing. Capsule endoscopy considered but never done.   Hyperlipidemia    Hypertension    Left bundle branch block (LBBB) 12/16/2016   Lung cancer ([facility]   non-small cell lung ca, stage III in 05/2011; systemic chemotherapy concurrent with radiation followed by prophylactic cranial irradiation and has been observation since July of 2010 with no evidence for disease recurrence-released from onc f/u 12/2014 (needs annual cxr by PCP).  CXR 08/2015  stable.  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 Earlie Server did bx and this was NEG for malignancy.   Mild cognitive impairment with memory loss    Likely from brain radiation therapy   Non-obstructive CAD (coronary artery disease)    a. Cath 2006 preserved LV fxn, scattered irregularities without critical stenosis; b. 2008 stress echo negative for ischemia, but with hypertensive response; c. 08/2016 Cath: D1 25%, otw nl.   Normocytic anemia 2018   Iron, vit B12, folate all normal 12/2016.   Open-angle glaucoma 2016   Dr. Shirley Muscat, (bilateral)---responding to  topical therapy   Osteoarthritis of both hands    Osteoporosis 03/2016; 03/2018   03/2016 DEXA T-score -2.2.  03/2018 T-score L femoral neck  -2.7--boniva started.   Pneumonia    hospitalization 11/2016; hypoxemic resp failure--d/c'd home with home oxygen therapy   Seasonal allergic rhinitis    Subclinical hyperthyroidism 2018   Takotsubo cardiomyopathy    a. 08/2016 Echo: EF 30-35%, gr1DD, PASP 61mHg;  b. 08/2016 Cath: nonobs Dzs;  c. 11/2016 Echo: EF 40-45%, no rwma, nl RV fxn, mild TR, PASP 376mg.  ECHO 02/2017 EF 50-55%, grd II DD---essentially resolved Takotsubo 02/2017.   [place] de pointes ([facility]11/[place]   a. 08/2016 in setting of diverticulitis & pneumoperitoneum and Takotsubo CM -->prolonged QT, seen by EP-->avoid meds with potential for QT prolongation.    Past Surgical History:  Procedure Laterality Date   ABDOMINAL HYSTERECTOMY  1997   APPENDECTOMY     CARDIAC CATHETERIZATION N/A 09/14/2016   Procedure: Left Heart Cath and Coronary Angiography;  Surgeon: ThTroy SineMD;  [facility];  Service: Cardiovascular; Mild nonobstructive CAD with 85% smooth narrowing in the first diagonal branch of the LAD; 10% smooth narrowing of the ostial proximal left circumflex coronary artery; and a normal dominant RCA.   CARPAL TUNNEL RELEASE     CATARACT EXTRACTION Bilateral    CHOLECYSTECTOMY  2002   COLONOSCOPY  10/24/00; 08/27/19   2002 normal.  [employer] hemoccult testing via [facility] 06/18/15 was NEG.  2020->diverticulosis o/w nl.   dexa  03/2016; 03/2018   03/2018 T-score L femoral neck  -2.7 (worsened compared to osteopenic range 2017.)   ESOPHAGOGASTRODUODENOSCOPY  08/27/2019   Normal.   IR GENERIC HISTORICAL  09/19/2016   IR SINUS/FIST TUBE CHK-NON GI 09/19/2016 JaCorrie MckusickDO [facility]   IR GENERIC HISTORICAL  10/06/2016   IR RADIOLOGIST EVAL & MGMT 10/06/2016 [facility]   IR GENERIC HISTORICAL  10/26/2016   IR RADIOLOGIST EVAL & MGMT 10/26/2016 [facility]    IR GENERIC HISTORICAL  11/03/2016   IR RADIOLOGIST EVAL & MGMT 11/03/2016 WeArdis RowanPA-C [facility]   IR GENERIC HISTORICAL  11/24/2016   IR RADIOLOGIST EVAL & MGMT 11/24/2016 WeArdis RowanPA-C [facility]   IR GENERIC HISTORICAL  12/05/2016   Fistula smaller/improving.  IR SINUS/FIST TUBE CHK-NON GI 12/05/2016 JoSandi MariscalMD [facility]   IR GENERIC HISTORICAL  12/22/2016   IR RADIOLOGIST EVAL & MGMT 12/22/2016 MiGreggory KeenMD [facility]   IR RADIOLOGIST EVAL & MGMT  01/10/2017   IR RADIOLOGIST EVAL & MGMT  02/09/2017   IR SINUS/FIST TUBE CHK-NON GI  03/28/2017   LEFT HEART CATHETERIZATION WITH CORONARY ANGIOGRAM N/A 05/30/2012   Procedure: LEFT HEART CATHETERIZATION WITH CORONARY ANGIOGRAM;  Surgeon: ChBurnell BlanksMD;  [facility];  Service: Cardiovascular: Mild, non-obstructive CAD   TRANSTHORACIC ECHOCARDIOGRAM  09/14/2016; 11/2016  08/2016: EF 30-35 %, Akinesis of the mid-apicalanteroseptal myocardium.  Grade I DD.  Mild pulm HTN.  Repeat echo 11/2016, EF 40-45%, no rwma, nl RV fxn, mild TR, PASP 53mHg.     TRANSTHORACIC ECHOCARDIOGRAM  03/07/2017   EF 50-55%. Hypokinesis of the distal septum with overall low normal LV,  systolic function; mild diastolic dysfunction with elevated LV  filling pressure; mildly calcified aortic valve with mild AI; small pericardial effusion    Outpatient Medications Prior to Visit  Medication Sig Dispense Refill   albuterol (PROVENTIL) (2.5 MG/3ML) 0.083% nebulizer solution USE 1 VIAL IN NEBULIZER EVERY 4 HOURS AS NEEDED FOR WHEEZING AND FOR SHORTNESS OF BREATH 90 mL 1   albuterol (VENTOLIN HFA) 108 (90 Base) MCG/ACT inhaler INHALE 2 PUFFS BY MOUTH EVERY 6 HOURS AS NEEDED FOR WHEEZING OR SHORTNESS OF BREATH 18 g 1   aspirin 81 MG EC tablet Take 81 mg by mouth daily.      atorvastatin (LIPITOR) 40 MG tablet Take 1 tablet by mouth once daily 90 tablet 1   buPROPion (WELLBUTRIN XL) 150 MG 24 hr tablet Take 1  tablet by mouth once daily 90 tablet 0   cetirizine (ZYRTEC) 10 MG tablet Take 10 mg by mouth daily.     escitalopram (LEXAPRO) 20 MG tablet Take 1 tablet by mouth once daily 90 tablet 0   fluticasone (FLOVENT HFA) 220 MCG/ACT inhaler Inhale 2 puffs into the lungs 2 (two) times daily. 12 g 11   glucose blood (ONETOUCH ULTRA) test strip USE 1 STRIP TO CHECK GLUCOSE THREE TIMES DAILY 100 each 5   ibandronate (BONIVA) 150 MG tablet Take 1 tablet (150 mg total) by mouth every 30 (thirty) days. Take in the morning with a full glass of water, on an empty stomach, and do not take anything else by mouth or lie down for the next 30 min. 3 tablet 3   insulin isophane & regular human (HUMULIN 70/30 KWIKPEN) (70-30) 100 UNIT/ML KwikPen INJECT 4 UNITS SUBCUTANEOUSLY WITH BREAKFAST AND 2 UNITS WITH SUPPER 15 mL 1   Lancets (ONETOUCH DELICA PLUS LZWCHEN27P MISC USE 1  TO CHECK GLUCOSE THREE TIMES DAILY 100 each 5   metoprolol tartrate (LOPRESSOR) 25 MG tablet 1/2 tab po bid 90 tablet 3   Multiple Vitamins-Minerals (MULTIVITAMIN WOMEN PO) Take by mouth daily.     pantoprazole (PROTONIX) 40 MG tablet Take 1 tablet by mouth once daily 90 tablet 0   No facility-administered medications prior to visit.    Allergies  Allergen Reactions   Lactose Intolerance (Gi) Diarrhea   Ibuprofen Other (See Comments)    REACTION: Anxiousness, hyperventilates    ROS As per HPI  PE: Vitals with BMI 04/19/2021 01/27/2021 12/16/2020  Height 5' 2"  5' 2"  5' 2"   Weight 130 lbs 137 lbs 6 oz 139 lbs  BMI 23.77 [phone] Systolic 95 [phone] Diastolic 65 66 78  Pulse 87 98 92     Gen: Alert, well appearing.  Patient is oriented to person, place, time, and situation. AFFECT: pleasant, lucid thought and speech. CV: RRR, no m/r/g.   LUNGS: CTA bilat, nonlabored resps, good aeration in all lung fields. EXT: no clubbing or cyanosis.  no edema.  Ambulates with assistance, short steps, slowly   LABS:  Lab Results   Component Value Date   TSH 1.24 04/20/2017   Lab Results  Component Value Date   WBC 5.9 10/28/2020   HGB 13.1 10/28/2020   HCT 39.9 10/28/2020  MCV 90.0 10/28/2020   PLT 191.0 10/28/2020   Lab Results  Component Value Date   CREATININE 0.95 (H) 01/27/2021   BUN 22 01/27/2021   NA 139 01/27/2021   K 4.4 01/27/2021   CL 104 01/27/2021   CO2 26 01/27/2021   Lab Results  Component Value Date   ALT 17 10/28/2020   AST 19 10/28/2020   ALKPHOS 68 10/28/2020   BILITOT 0.3 10/28/2020   Lab Results  Component Value Date   CHOL 130 10/28/2020   Lab Results  Component Value Date   HDL 43.00 10/28/2020   Lab Results  Component Value Date   LDLCALC 71 10/28/2020   Lab Results  Component Value Date   TRIG 79.0 10/28/2020   Lab Results  Component Value Date   CHOLHDL 3 10/28/2020   Lab Results  Component Value Date   HGBA1C 6.4 (H) 01/27/2021   IMPRESSION AND PLAN:  1) DM: well controlled on low doses of 70/30 insulin bid. Too early for rpt a1c so she'll return for lab appt in 10-14d to get this.  2) HTN: well controlled. Cont 1/2 32m lopressor bid. Lytes/cr 10-14d with other labs.  3) HLD: olerating atorva 42mqd. LDL 71 three mo ago. Plan recheck lipids 6 mo.  4) CRI III: avoids nsaids. Follow lytes/cr in 10-14 with other labs.  5) COPD: stable.  Cont flovent HFA daily and albuterol q4h prn.  6) Dementia: she's gradually progressing with memory loss and having personality changes that are particularly bothersome to her helpful and stressed out caregivers (daughter and grandson). Empathetic listening today but nothing else to do at this time.  I don't think changing her wellbutrin or lexapro is going to help anything.  An After Visit Summary was printed and given to the patient.  FOLLOW UP: Return in about 3 months (around 07/20/2021) for routine chronic illness f/u.  Signed:  PhCrissie SicklesMD           04/21/2021

## 2021-04-29 ENCOUNTER — Ambulatory Visit (INDEPENDENT_AMBULATORY_CARE_PROVIDER_SITE_OTHER): Payer: Medicare Other

## 2021-04-29 ENCOUNTER — Other Ambulatory Visit: Payer: Self-pay

## 2021-04-29 DIAGNOSIS — E11319 Type 2 diabetes mellitus with unspecified diabetic retinopathy without macular edema: Secondary | ICD-10-CM | POA: Diagnosis not present

## 2021-04-29 DIAGNOSIS — Z794 Long term (current) use of insulin: Secondary | ICD-10-CM

## 2021-04-29 DIAGNOSIS — E7800 Pure hypercholesterolemia, unspecified: Secondary | ICD-10-CM

## 2021-04-29 DIAGNOSIS — N183 Chronic kidney disease, stage 3 unspecified: Secondary | ICD-10-CM

## 2021-04-29 LAB — COMPREHENSIVE METABOLIC PANEL
ALT: 12 U/L (ref 0–35)
AST: 16 U/L (ref 0–37)
Albumin: 4.1 g/dL (ref 3.5–5.2)
Alkaline Phosphatase: 76 U/L (ref 39–117)
BUN: 20 mg/dL (ref 6–23)
CO2: 27 mEq/L (ref 19–32)
Calcium: 9 mg/dL (ref 8.4–10.5)
Chloride: 102 mEq/L (ref 96–112)
Creatinine, Ser: 1.07 mg/dL (ref 0.40–1.20)
GFR: 51.77 mL/min — ABNORMAL LOW (ref >60.00)
Glucose, Bld: 127 mg/dL — ABNORMAL HIGH (ref 70–99)
Potassium: 4.7 mEq/L (ref 3.5–5.1)
Sodium: 137 mEq/L (ref 135–145)
Total Bilirubin: 0.4 mg/dL (ref 0.2–1.2)
Total Protein: 6.7 g/dL (ref 6.0–8.3)

## 2021-04-29 LAB — HEMOGLOBIN A1C: Hgb A1c MFr Bld: 6.4 % (ref 4.6–6.5)

## 2021-05-03 ENCOUNTER — Other Ambulatory Visit: Payer: Self-pay

## 2021-05-03 ENCOUNTER — Ambulatory Visit
Admission: RE | Admit: 2021-05-03 | Discharge: 2021-05-03 | Disposition: A | Discharge status: Home / Self Care | Payer: Medicare Other | Source: Ambulatory Visit | Attending: Family Medicine | Admitting: Family Medicine

## 2021-05-03 DIAGNOSIS — Z1231 Encounter for screening mammogram for malignant neoplasm of breast: Secondary | ICD-10-CM

## 2021-05-03 DIAGNOSIS — E2839 Other primary ovarian failure: Secondary | ICD-10-CM

## 2021-05-03 DIAGNOSIS — M810 Age-related osteoporosis without current pathological fracture: Secondary | ICD-10-CM | POA: Diagnosis not present

## 2021-05-03 DIAGNOSIS — Z780 Asymptomatic menopausal state: Secondary | ICD-10-CM | POA: Diagnosis not present

## 2021-05-03 LAB — HM MAMMOGRAPHY

## 2021-05-04 ENCOUNTER — Other Ambulatory Visit: Payer: Self-pay | Admitting: *Deleted

## 2021-05-04 ENCOUNTER — Encounter: Payer: Self-pay | Admitting: Family Medicine

## 2021-05-04 NOTE — Patient Outreach (Signed)
[facility]) Care Management  05/04/2021  [name] 07/04/1946 [id number]   Outgoing call placed to [facility] daughter/caregiver.  State member is doing well.  Has been able to spend some time with her son and his family.  Confirms they have received new walker, anticipating work with PT to increase strength.  Denies any urgent concerns, encouraged to contact this care manager with questions.  Agrees to follow up within the next month.   Goals Addressed             This Visit's Progress    TH[facility] - Make and Keep All Appointments   On track    Timeframe:  Short-Term Goal Priority:  Medium Start Date:          5/31                   Expected End Date:       7/31              Follow Up Date 6/13   - call to cancel if needed - keep a calendar with appointment dates    Why is this important?   Part of staying healthy is seeing the doctor for follow-up care.  If you forget your appointments, there are some things you can do to stay on track.    Notes:   5/31- Reviewed appointments with daughter, 6/3 with eye doctor and 6/27 with PCP  6/13 - Reminded daughter of PCP appointment scheduled for 6/27.    7/12 - Confirmed with daughter member attended follow up appointment with PCP, next appointment in September.  State  she discussed concern for member's slow mental decline, per notes this is to be expected.  No medication changes at this time, daughter verbalizes understanding on disease process.      THN - Prevent Falls and Injury   On track    Follow Up Date 6/13  Timeframe:  Long-Range Goal Priority:  High Start Date:     5/31                        Expected End Date:        8/31               Barriers: Other - decreased strength and broken DME    - make an emergency alert plan in case I fall - use a cane or walker - use a nightlight in the bathroom    Why is this important?   Most falls happen when it is hard for you to walk safely. Your  balance may be off because of an illness. You may have pain in your knees, hip or other joints.  You may be overly tired or taking medicines that make you sleepy. You may not be able to see or hear clearly.  Falls can lead to broken bones, bruises or other injuries.  There are things you can do to help prevent falling.     Notes:   5/31 - Call placed to PCP office to request orders for new walker and home health PT  6/13 - Confirmed member has new walker, call placed to PCP office to request referral for home health PT  7/12 - Still has not started PT.  Noted per chart, referral was sent [facility], they were unable to take due to staffing.  Call placed to PCP office to inquire about process.  Notified it was sent to  [place].  Call placed to [facility], they have not received referral.  Call placed back to PCP, advised of need to re-send referral, provided fax numbers.        [name], [place], MSN [place] [phone]

## 2021-05-17 ENCOUNTER — Other Ambulatory Visit: Payer: Self-pay | Admitting: *Deleted

## 2021-05-17 NOTE — Patient Outreach (Signed)
[facility]) Care Management  05/17/2021  [name] October 20, 1946 [id number]   Outgoing call placed to member/daughter to follow up on start of home health services, no answer, HIPAA compliant voice message left.  Call placed to [facility], spoke with Marcie Bal, advised that they have not received referral yet.  Call placed to PCP office again (first call on 6/13, last call on 7/12) to request referral be sent, provided with 2 fax numbers for [facility] [phone] & [phone]).  Per [name], orders will be sent.  Will follow up within the next 3-4 business days.  [name], [place], MSN [place] [phone]

## 2021-05-19 ENCOUNTER — Other Ambulatory Visit: Payer: Self-pay | Admitting: Family Medicine

## 2021-05-19 ENCOUNTER — Ambulatory Visit: Payer: Self-pay | Admitting: *Deleted

## 2021-05-19 DIAGNOSIS — E119 Type 2 diabetes mellitus without complications: Secondary | ICD-10-CM

## 2021-05-20 ENCOUNTER — Other Ambulatory Visit: Payer: Self-pay | Admitting: *Deleted

## 2021-05-20 ENCOUNTER — Telehealth: Payer: Self-pay

## 2021-05-20 NOTE — Telephone Encounter (Signed)
See note below regarding Home Health request  Correct phone number for [facility] is  [phone].  Please expedite this request as patient is still waiting for home health assessment.  Thank you

## 2021-05-20 NOTE — Patient Outreach (Signed)
[facility]) Care Management  05/20/2021  [name] December 11, 1945 [id number]   Call placed to [facility] to follow up on [facility] referral.  Spoke with Marcie Bal, state no referral received.  Also spoke with intake specialist, confirms no referral was received.  Verified fax numbers, notified that wrong fax number was provided initially.  Correct fax number for intake is [phone].  Call placed to PCP office, correct information provided and request made to send referral.  Will follow up within the next 3-4 business days.  [name], [place], MSN [place] [phone]

## 2021-05-24 ENCOUNTER — Other Ambulatory Visit: Payer: Self-pay | Admitting: Family Medicine

## 2021-05-25 ENCOUNTER — Telehealth: Payer: Self-pay

## 2021-05-25 ENCOUNTER — Other Ambulatory Visit: Payer: Self-pay

## 2021-05-25 DIAGNOSIS — R5381 Other malaise: Secondary | ICD-10-CM

## 2021-05-25 DIAGNOSIS — R2681 Unsteadiness on feet: Secondary | ICD-10-CM

## 2021-05-25 MED ORDER — IBANDRONATE SODIUM 150 MG PO TABS: 150.0000 mg | ORAL_TABLET | ORAL | 3 refills | Status: DC

## 2021-05-25 NOTE — Telephone Encounter (Signed)
New HH order entered  [10:28 AM] [place], [name] Can you please have Dr. Anitra Lauth add a new home health order for Whisper Kurka [id number]? I got her into [facility] and they said the existing order is too old. Thanks!

## 2021-05-26 DIAGNOSIS — E7800 Pure hypercholesterolemia, unspecified: Secondary | ICD-10-CM | POA: Diagnosis not present

## 2021-05-26 DIAGNOSIS — I129 Hypertensive chronic kidney disease with stage 1 through stage 4 chronic kidney disease, or unspecified chronic kidney disease: Secondary | ICD-10-CM | POA: Diagnosis not present

## 2021-05-26 DIAGNOSIS — N183 Chronic kidney disease, stage 3 unspecified: Secondary | ICD-10-CM | POA: Diagnosis not present

## 2021-05-26 DIAGNOSIS — E11311 Type 2 diabetes mellitus with unspecified diabetic retinopathy with macular edema: Secondary | ICD-10-CM | POA: Diagnosis not present

## 2021-05-26 DIAGNOSIS — Z8673 Personal history of transient ischemic attack (TIA), and cerebral infarction without residual deficits: Secondary | ICD-10-CM | POA: Diagnosis not present

## 2021-05-26 DIAGNOSIS — E1122 Type 2 diabetes mellitus with diabetic chronic kidney disease: Secondary | ICD-10-CM | POA: Diagnosis not present

## 2021-05-26 DIAGNOSIS — J449 Chronic obstructive pulmonary disease, unspecified: Secondary | ICD-10-CM | POA: Diagnosis not present

## 2021-05-26 DIAGNOSIS — E1136 Type 2 diabetes mellitus with diabetic cataract: Secondary | ICD-10-CM | POA: Diagnosis not present

## 2021-05-26 DIAGNOSIS — Z7982 Long term (current) use of aspirin: Secondary | ICD-10-CM | POA: Diagnosis not present

## 2021-05-26 DIAGNOSIS — M810 Age-related osteoporosis without current pathological fracture: Secondary | ICD-10-CM | POA: Diagnosis not present

## 2021-05-26 DIAGNOSIS — K219 Gastro-esophageal reflux disease without esophagitis: Secondary | ICD-10-CM | POA: Diagnosis not present

## 2021-05-26 DIAGNOSIS — Z7951 Long term (current) use of inhaled steroids: Secondary | ICD-10-CM | POA: Diagnosis not present

## 2021-05-26 DIAGNOSIS — Z794 Long term (current) use of insulin: Secondary | ICD-10-CM | POA: Diagnosis not present

## 2021-05-26 DIAGNOSIS — I5181 Takotsubo syndrome: Secondary | ICD-10-CM | POA: Diagnosis not present

## 2021-05-26 DIAGNOSIS — I447 Left bundle-branch block, unspecified: Secondary | ICD-10-CM | POA: Diagnosis not present

## 2021-05-26 DIAGNOSIS — H25013 Cortical age-related cataract, bilateral: Secondary | ICD-10-CM | POA: Diagnosis not present

## 2021-05-26 DIAGNOSIS — M19041 Primary osteoarthritis, right hand: Secondary | ICD-10-CM | POA: Diagnosis not present

## 2021-05-26 DIAGNOSIS — I2510 Atherosclerotic heart disease of native coronary artery without angina pectoris: Secondary | ICD-10-CM | POA: Diagnosis not present

## 2021-05-26 DIAGNOSIS — Z9181 History of falling: Secondary | ICD-10-CM | POA: Diagnosis not present

## 2021-05-26 DIAGNOSIS — D631 Anemia in chronic kidney disease: Secondary | ICD-10-CM | POA: Diagnosis not present

## 2021-05-26 DIAGNOSIS — M19042 Primary osteoarthritis, left hand: Secondary | ICD-10-CM | POA: Diagnosis not present

## 2021-05-26 DIAGNOSIS — F32A Depression, unspecified: Secondary | ICD-10-CM | POA: Diagnosis not present

## 2021-05-27 ENCOUNTER — Other Ambulatory Visit: Payer: Self-pay | Admitting: *Deleted

## 2021-05-27 ENCOUNTER — Telehealth: Payer: Self-pay | Admitting: Family Medicine

## 2021-05-27 NOTE — Telephone Encounter (Signed)
[place] Name:  Andee Lineman w/[facility] [employer] [place] (Other) [phone]    Service Requested: [facility] PT (examples: OT/PT/Skilled Nursing/Social Work/Speech Therapy/Wound Care) Frequency of Visits: 1 week 1, 3 week 1, 2 week 1  Please call with VO

## 2021-05-27 NOTE — Telephone Encounter (Signed)
Ok for orders? 

## 2021-05-27 NOTE — Patient Outreach (Signed)
[facility]) Care Management  05/27/2021  [name] 1946-07-17 [id number]   Note per chart that [facility] referral was sent to [facility] instead of [place].  Call placed to [facility], spoke to [facility], confirmed they have received referral and working on contacting member.  Will follow up with member/daughter within the next week.  [name], [place], MSN [place] [phone]

## 2021-05-27 NOTE — Telephone Encounter (Signed)
Left detailed message providing verbal orders on secure VM, fax number given as well

## 2021-05-27 NOTE — Telephone Encounter (Signed)
Yes okay 

## 2021-05-28 ENCOUNTER — Other Ambulatory Visit: Payer: Self-pay

## 2021-05-28 ENCOUNTER — Emergency Department (HOSPITAL_COMMUNITY): Payer: Medicare Other

## 2021-05-28 ENCOUNTER — Emergency Department (HOSPITAL_COMMUNITY)
Admission: EM | Admit: 2021-05-28 | Discharge: 2021-05-28 | Disposition: A | Discharge status: Home / Self Care | Payer: Medicare Other | Attending: Emergency Medicine | Admitting: Emergency Medicine

## 2021-05-28 ENCOUNTER — Encounter (HOSPITAL_COMMUNITY): Payer: Self-pay | Admitting: *Deleted

## 2021-05-28 DIAGNOSIS — I10 Essential (primary) hypertension: Secondary | ICD-10-CM | POA: Diagnosis not present

## 2021-05-28 DIAGNOSIS — S79911A Unspecified injury of right hip, initial encounter: Secondary | ICD-10-CM | POA: Diagnosis present

## 2021-05-28 DIAGNOSIS — N183 Chronic kidney disease, stage 3 unspecified: Secondary | ICD-10-CM | POA: Diagnosis not present

## 2021-05-28 DIAGNOSIS — G319 Degenerative disease of nervous system, unspecified: Secondary | ICD-10-CM | POA: Diagnosis not present

## 2021-05-28 DIAGNOSIS — N3000 Acute cystitis without hematuria: Secondary | ICD-10-CM | POA: Insufficient documentation

## 2021-05-28 DIAGNOSIS — S7001XA Contusion of right hip, initial encounter: Secondary | ICD-10-CM | POA: Diagnosis not present

## 2021-05-28 DIAGNOSIS — Z794 Long term (current) use of insulin: Secondary | ICD-10-CM | POA: Insufficient documentation

## 2021-05-28 DIAGNOSIS — Z87891 Personal history of nicotine dependence: Secondary | ICD-10-CM | POA: Diagnosis not present

## 2021-05-28 DIAGNOSIS — Z85118 Personal history of other malignant neoplasm of bronchus and lung: Secondary | ICD-10-CM | POA: Insufficient documentation

## 2021-05-28 DIAGNOSIS — Z7952 Long term (current) use of systemic steroids: Secondary | ICD-10-CM | POA: Diagnosis not present

## 2021-05-28 DIAGNOSIS — R531 Weakness: Secondary | ICD-10-CM | POA: Diagnosis not present

## 2021-05-28 DIAGNOSIS — I5032 Chronic diastolic (congestive) heart failure: Secondary | ICD-10-CM | POA: Insufficient documentation

## 2021-05-28 DIAGNOSIS — Z79899 Other long term (current) drug therapy: Secondary | ICD-10-CM | POA: Diagnosis not present

## 2021-05-28 DIAGNOSIS — J449 Chronic obstructive pulmonary disease, unspecified: Secondary | ICD-10-CM | POA: Diagnosis not present

## 2021-05-28 DIAGNOSIS — E1122 Type 2 diabetes mellitus with diabetic chronic kidney disease: Secondary | ICD-10-CM | POA: Diagnosis not present

## 2021-05-28 DIAGNOSIS — W19XXXA Unspecified fall, initial encounter: Secondary | ICD-10-CM | POA: Insufficient documentation

## 2021-05-28 DIAGNOSIS — R519 Headache, unspecified: Secondary | ICD-10-CM | POA: Insufficient documentation

## 2021-05-28 DIAGNOSIS — R413 Other amnesia: Secondary | ICD-10-CM | POA: Diagnosis not present

## 2021-05-28 DIAGNOSIS — G4489 Other headache syndrome: Secondary | ICD-10-CM | POA: Diagnosis not present

## 2021-05-28 DIAGNOSIS — Z743 Need for continuous supervision: Secondary | ICD-10-CM | POA: Diagnosis not present

## 2021-05-28 DIAGNOSIS — Z7982 Long term (current) use of aspirin: Secondary | ICD-10-CM | POA: Insufficient documentation

## 2021-05-28 DIAGNOSIS — I130 Hypertensive heart and chronic kidney disease with heart failure and stage 1 through stage 4 chronic kidney disease, or unspecified chronic kidney disease: Secondary | ICD-10-CM | POA: Insufficient documentation

## 2021-05-28 DIAGNOSIS — I6782 Cerebral ischemia: Secondary | ICD-10-CM | POA: Diagnosis not present

## 2021-05-28 DIAGNOSIS — R6889 Other general symptoms and signs: Secondary | ICD-10-CM | POA: Diagnosis not present

## 2021-05-28 LAB — BASIC METABOLIC PANEL
Anion gap: 6 (ref 5–15)
BUN: 16 mg/dL (ref 8–23)
CO2: 25 mmol/L (ref 22–32)
Calcium: 8.1 mg/dL — ABNORMAL LOW (ref 8.9–10.3)
Chloride: 103 mmol/L (ref 98–111)
Creatinine, Ser: 0.9 mg/dL (ref 0.44–1.00)
GFR, Estimated: >60 mL/min (ref >60)
Glucose, Bld: 113 mg/dL — ABNORMAL HIGH (ref 70–99)
Potassium: 4.3 mmol/L (ref 3.5–5.1)
Sodium: 134 mmol/L — ABNORMAL LOW (ref 135–145)

## 2021-05-28 LAB — URINALYSIS, ROUTINE W REFLEX MICROSCOPIC
Bilirubin Urine: NEGATIVE
Glucose, UA: NEGATIVE mg/dL
Hgb urine dipstick: NEGATIVE
Ketones, ur: NEGATIVE mg/dL
Nitrite: POSITIVE — AB
Protein, ur: 30 mg/dL — AB
Specific Gravity, Urine: 1.017 (ref 1.005–1.030)
WBC, UA: >50 WBC/hpf — ABNORMAL HIGH (ref 0–5)
pH: 6 (ref 5.0–8.0)

## 2021-05-28 LAB — CBC
HCT: 35.3 % — ABNORMAL LOW (ref 36.0–46.0)
Hemoglobin: 11.8 g/dL — ABNORMAL LOW (ref 12.0–15.0)
MCH: 30.9 pg (ref 26.0–34.0)
MCHC: 33.4 g/dL (ref 30.0–36.0)
MCV: 92.4 fL (ref 80.0–100.0)
Platelets: 200 10*3/uL (ref 150–400)
RBC: 3.82 MIL/uL — ABNORMAL LOW (ref 3.87–5.11)
RDW: 14.3 % (ref 11.5–15.5)
WBC: 6.9 10*3/uL (ref 4.0–10.5)
nRBC: 0 % (ref 0.0–0.2)

## 2021-05-28 LAB — CBG MONITORING, ED: Glucose-Capillary: 212 mg/dL — ABNORMAL HIGH (ref 70–99)

## 2021-05-28 MED ORDER — CEPHALEXIN 500 MG PO CAPS: 500.0000 mg | ORAL_CAPSULE | Freq: Four times a day (QID) | ORAL | 0 refills | Status: DC

## 2021-05-28 MED ORDER — SODIUM CHLORIDE 0.9 % IV SOLN
2.0000 g | Freq: Once | INTRAVENOUS | Status: AC
  Administered 2021-05-28: 2 g via INTRAVENOUS
  Filled 2021-05-28: qty 20

## 2021-05-28 MED ORDER — IOHEXOL 350 MG/ML SOLN
80.0000 mL | Freq: Once | INTRAVENOUS | Status: AC | PRN
  Administered 2021-05-28: 80 mL via INTRAVENOUS

## 2021-05-28 NOTE — ED Notes (Signed)
Food given 

## 2021-05-28 NOTE — ED Notes (Addendum)
Grandson now at bedside.  States pt is back to normal, but pt was "staring off into space" and could only say people's nick-names, but not their real names.  Grandson now states pt is back to normal.  Yolanda Bonine also states that pt had a brief moment last night where she could not say anything.

## 2021-05-28 NOTE — ED Notes (Signed)
Pt brief wet with urine. RN changed pt into gown and new brief, purewick in place. To obtain labs. VSS. Denies HA at this time.

## 2021-05-28 NOTE — ED Notes (Signed)
Pt wants food  Dr ray will let me know

## 2021-05-28 NOTE — ED Provider Notes (Signed)
[facility] EMERGENCY DEPARTMENT Provider Note   CSN: [id number] Arrival date & time: 05/28/21  1440     History Chief Complaint  Patient presents with   Headache    [name] is a 75 y.o. female with past medical history of non-small cell lung cancer, COPD, CVA that is presenting with her grandson due to word finding difficulties today.  Grandson reports that today at 12:30 PM his grandmother began to stare off into space. Her grandson would ask her what his name was or his cousins names were and the patient would stare off into the distance and respond several seconds later.  Patient's grandson says that the patient has returned back to her baseline.  Last night patient had a brief moment where she had word finding difficulty.  She denies any weakness or numbness in her arms or legs. Patient denies any fever, headache, neck stiffness, chest pain, shortness breath, nausea, vomiting, diarrhea, numbness or weakness.  1 week ago the grandson and the patient's daughter helped walk the patient and she fell onto her right hip.  She has bruising to her right hip.  She has tenderness to palpation to her right hip.  She has no midline spinal tenderness.  She had no loss of consciousness.  HPI     Past Medical History:  Diagnosis Date   Age-related nuclear cataract of both eyes 2016   +cortical age related cataracts OU    Anxiety and depression    Bilateral diabetic retinopathy (HCC) 2015   Dr. 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   COPD (chronic obstructive pulmonary disease) (HCC)    CVA (cerebral vascular accident) (HCC) 09/2016   MRI did show a left-sided small ischemic stroke which was acute but likely incidental (MRI was done b/c pt had TIA sx's in hospital).  Neuro put  her on plavix at that time.  Cardiology d/c'd her plavix 08/2017.   Diabetes mellitus with complication (HCC)    diab retinopathy OU (laser)   Finger fracture, right 04/2020   R 5th metacarpal fx at the base (Dr. Landau)   GERD (gastroesophageal reflux disease)    protonix   History of concussion 08/25/2018   w/out loss of consciousness--mild increase in baseline memory impairment after.  CT head neg acute.   History of diverticulitis of colon    with abscess; required IR percutaneous drain placed 09/2016.   History of iron deficiency anemia    started iron 07/02/19.  EGD/colonoscopy 08/27/19 unrevealing. Capsule endoscopy considered but never done.   Hyperlipidemia    Hypertension    Left bundle branch block (LBBB) 12/16/2016   Lung cancer (HCC)    non-small cell lung ca, stage III in 05/2011; systemic chemotherapy concurrent with radiation followed by prophylactic cranial irradiation and has been observation since July of 2010 with no evidence for disease recurrence-released from onc f/u 12/2014 (needs annual cxr by PCP).  CXR 08/2015 stable.  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 Mohammed did bx and this was NEG for malignancy.   Mild cognitive impairment with memory loss    Likely from brain radiation therapy   Non-obstructive CAD (coronary artery disease)    a. Cath 2006 preserved LV fxn, scattered irregularities without critical stenosis; b. 2008 stress echo negative for ischemia, but   with hypertensive response; c. 08/2016 Cath: D1 25%, otw nl.   Normocytic anemia 2018   Iron, vit B12, folate all normal 12/2016.   Open-angle glaucoma 2016   Dr. Shirley Muscat, (bilateral)---responding to topical therapy   Osteoarthritis of both hands    Osteoporosis 03/2016; 03/2018   03/2016 DEXA T-score -2.2.  03/2018 T-score L femoral neck  -2.7--boniva started. DEXA 04/2021 T scor -2.6->boniva continued. Plan rpt DEXA 2 yrs.   Pneumonia    hospitalization 11/2016; hypoxemic resp failure--d/c'd home with home  oxygen therapy   Seasonal allergic rhinitis    Subclinical hyperthyroidism 2018   Takotsubo cardiomyopathy    a. 08/2016 Echo: EF 30-35%, gr1DD, PASP 37mHg;  b. 08/2016 Cath: nonobs Dzs;  c. 11/2016 Echo: EF 40-45%, no rwma, nl RV fxn, mild TR, PASP 314mg.  ECHO 02/2017 EF 50-55%, grd II DD---essentially resolved Takotsubo 02/2017.   Torsades de pointes (HCSan Joaquin11/2017   a. 08/2016 in setting of diverticulitis & pneumoperitoneum and Takotsubo CM -->prolonged QT, seen by EP-->avoid meds with potential for QT prolongation.    Patient Active Problem List   Diagnosis Date Noted   Acute renal failure (HCAntw[place]   Fall    Recurrent falls 10/08/2019   Hypocalcemia 10/08/2019   Preoperative cardiovascular examination 01/12/2017   Diarrhea 12/02/2016   CAP (community acquired pneumonia) 12/01/2016   Essential hypertension 12/01/2016   Chronic diastolic (congestive) heart failure (HCDeepwater02/05/2017   Cerebrovascular accident (CVA) (HC[place] Fair O[place]   Hypertension associated with chronic kidney disease due to type 2 diabetes mellitus (HCWilmer11/25/2017   Cardiac arrest with ventricular fibrillation (HCBunk Foss11/22/2017   Takotsubo cardiomyopathy 09/14/2016   Colonic diverticular abscess    Diverticulitis of large intestine with perforation and abscess 09/11/2016   Controlled type 2 diabetes mellitus with hyperglycemia, with long-term current use of insulin (HCRegister08/22/2017   Hyperlipidemia 06/14/2016   COPD exacerbation (HCRichfield12/26/2014   Non-occlusive coronary artery disease 06/06/2012   Allergic rhinitis 01/06/2011   TOBACCO USE, QUIT 07/29/2009   Uncontrolled type 2 diabetes mellitus with complication ([id number] GERD (gastroesophageal reflux disease) 04/24/2007    Past Surgical History:  Procedure Laterality Date   ABDOMINAL HYSTERECTOMY  1997   APPENDECTOMY     CARDIAC CATHETERIZATION N/A 09/14/2016   Procedure: Left Heart Cath and Coronary Angiography;  Surgeon: ThTroy SineMD;  [facility];  Service: Cardiovascular; Mild nonobstructive CAD with 85% smooth narrowing in the first diagonal branch of the LAD; 10% smooth narrowing of the ostial proximal left circumflex coronary artery; and a normal dominant RCA.   CARPAL TUNNEL RELEASE     CATARACT EXTRACTION Bilateral    CHOLECYSTECTOMY  2002   COLONOSCOPY  10/24/00; 08/27/19   2002 normal.  [employer] hemoccult testing via [facility] 06/18/15 was NEG.  2020->diverticulosis o/w nl.   dexa  03/2016; 03/2018   03/2018 T-score L femoral neck  -2.7 (worsened compared to osteopenic range 2017.) DEXA 05/03/21 stable T score -2.6 on boniva x 2 yrs->plan rpt DEXA 2 yrs.   ESOPHAGOGASTRODUODENOSCOPY  08/27/2019   Normal.   IR GENERIC HISTORICAL  09/19/2016   IR SINUS/FIST TUBE CHK-NON GI 09/19/2016 JaCorrie MckusickDO [facility]   IR GENERIC HISTORICAL  10/06/2016   IR RADIOLOGIST EVAL & MGMT 10/06/2016 [facility]   IR GENERIC HISTORICAL  10/26/2016   IR RADIOLOGIST EVAL & MGMT 10/26/2016 [facility]   IR GENERIC HISTORICAL  11/03/2016   IR RADIOLOGIST EVAL & MGMT  11/03/2016 Ardis Rowan, PA-C [facility]   IR GENERIC HISTORICAL  11/24/2016   IR RADIOLOGIST EVAL & MGMT 11/24/2016 Ardis Rowan, PA-C [facility]   IR GENERIC HISTORICAL  12/05/2016   Fistula smaller/improving.  IR SINUS/FIST TUBE CHK-NON GI 12/05/2016 Sandi Mariscal, MD [facility]   IR GENERIC HISTORICAL  12/22/2016   IR RADIOLOGIST EVAL & MGMT 12/22/2016 Greggory Keen, MD [facility]   IR RADIOLOGIST EVAL & MGMT  01/10/2017   IR RADIOLOGIST EVAL & MGMT  02/09/2017   IR SINUS/FIST TUBE CHK-NON GI  03/28/2017   LEFT HEART CATHETERIZATION WITH CORONARY ANGIOGRAM N/A 05/30/2012   Procedure: LEFT HEART CATHETERIZATION WITH CORONARY ANGIOGRAM;  Surgeon: Burnell Blanks, MD;  Location: [facility] CATH LAB;  Service: Cardiovascular: Mild, non-obstructive CAD   TRANSTHORACIC ECHOCARDIOGRAM  09/14/2016; 11/2016   08/2016: EF 30-35 %, Akinesis  of the mid-apicalanteroseptal myocardium.  Grade I DD.  Mild pulm HTN.  Repeat echo 11/2016, EF 40-45%, no rwma, nl RV fxn, mild TR, PASP 56mHg.     TRANSTHORACIC ECHOCARDIOGRAM  03/07/2017   EF 50-55%. Hypokinesis of the distal septum with overall low normal LV,  systolic function; mild diastolic dysfunction with elevated LV  filling pressure; mildly calcified aortic valve with mild AI; small pericardial effusion     OB History   No obstetric history on file.     Family History  Problem Relation Age of Onset   Heart failure Mother    Kidney disease Mother        renal failure   Diabetes Mother    Diabetes Father    Diabetes Brother    Heart disease Brother    Heart attack Brother    Heart attack Brother    Heart attack Brother    Pancreatic cancer Brother    Colon cancer Neg Hx    Esophageal cancer Neg Hx    Stomach cancer Neg Hx    Rectal cancer Neg Hx     Social History   Tobacco Use   Smoking status: Former    Packs/day: 1.00    Years: 45.00    Pack years: 45.00    Types: Cigarettes    Quit date: 10/24/2008    Years since quitting: 12.6   Smokeless tobacco: Never  Vaping Use   Vaping Use: Never used  Substance Use Topics   Alcohol use: No    Alcohol/week: 0.0 standard drinks   Drug use: No    Home Medications Prior to Admission medications   Medication Sig Start Date End Date Taking? Authorizing Provider  cephALEXin (KEFLEX) 500 MG capsule Take 1 capsule (500 mg total) by mouth 4 (four) times daily. 05/28/21  Yes BDoretha Sou MD  albuterol (PROVENTIL) (2.5 MG/3ML) 0.083% nebulizer solution USE 1 VIAL IN NEBULIZER EVERY 4 HOURS AS NEEDED FOR WHEEZING AND FOR SHORTNESS OF BREATH 02/12/21   McGowen, PAdrian Blackwater MD  albuterol (VENTOLIN HFA) 108 (90 Base) MCG/ACT inhaler INHALE 2 PUFFS BY MOUTH EVERY 6 HOURS AS NEEDED FOR WHEEZING OR SHORTNESS OF BREATH 06/05/20   McGowen, PAdrian Blackwater MD  aspirin 81 MG EC tablet Take 81 mg by mouth daily.     [provider]   atorvastatin (LIPITOR) 40 MG tablet Take 1 tablet by mouth once daily 12/08/20   McGowen, PAdrian Blackwater MD  buPROPion (WELLBUTRIN XL) 150 MG 24 hr tablet Take 1 tablet by mouth once daily 05/24/21   McGowen, PAdrian Blackwater MD  cetirizine (ZYRTEC) 10 MG  tablet Take 10 mg by mouth daily.    [provider]  escitalopram (LEXAPRO) 20 MG tablet Take 1 tablet by mouth once daily 05/24/21   McGowen, Philip H, MD  fluticasone (FLOVENT HFA) 220 MCG/ACT inhaler Inhale 2 puffs into the lungs 2 (two) times daily. 10/28/20   McGowen, Philip H, MD  ibandronate (BONIVA) 150 MG tablet Take 1 tablet (150 mg total) by mouth every 30 (thirty) days. Take in the morning with a full glass of water, on an empty stomach, and do not take anything else by mouth or lie down for the next 30 min. 05/25/21   McGowen, Philip H, MD  insulin isophane & regular human (HUMULIN 70/30 KWIKPEN) (70-30) 100 UNIT/ML KwikPen INJECT 4 UNITS SUBCUTANEOUSLY WITH BREAKFAST AND 2 UNITS WITH SUPPER 12/25/20   McGowen, Philip H, MD  Lancets (ONETOUCH DELICA PLUS LANCET33G) MISC USE 1  TO CHECK GLUCOSE THREE TIMES DAILY 12/04/20   McGowen, Philip H, MD  metoprolol tartrate (LOPRESSOR) 25 MG tablet 1/2 tab po bid 04/24/20   McGowen, Philip H, MD  Multiple Vitamins-Minerals (MULTIVITAMIN WOMEN PO) Take by mouth daily.    [provider]  ONETOUCH ULTRA test strip USE 1 STRIP TO CHECK GLUCOSE THREE TIMES DAILY 05/19/21   McGowen, Philip H, MD  pantoprazole (PROTONIX) 40 MG tablet Take 1 tablet by mouth once daily 02/08/21   McGowen, Philip H, MD    Allergies    Lactose intolerance (gi) and Ibuprofen  Review of Systems   Review of Systems  Constitutional:  Negative for chills, diaphoresis, fatigue and fever.  HENT:  Negative for congestion, dental problem, ear discharge, ear pain, facial swelling, hearing loss, nosebleeds, postnasal drip, rhinorrhea, sinus pain, sneezing, sore throat and trouble swallowing.   Eyes:  Negative for pain and visual  disturbance.  Respiratory:  Negative for cough, chest tightness, shortness of breath, wheezing and stridor.   Cardiovascular:  Negative for chest pain, palpitations and leg swelling.  Gastrointestinal:  Negative for abdominal distention, abdominal pain, blood in stool, constipation, diarrhea, nausea and vomiting.  Endocrine: Negative for polydipsia and polyuria.  Genitourinary:  Negative for difficulty urinating, dysuria, flank pain, frequency, hematuria, urgency, vaginal bleeding and vaginal discharge.  Musculoskeletal:  Negative for myalgias, neck pain and neck stiffness.       Right hip pain  Skin:  Negative for rash and wound.  Allergic/Immunologic: Negative for environmental allergies and food allergies.  Neurological:  Positive for speech difficulty. Negative for dizziness, seizures, syncope, facial asymmetry, weakness, light-headedness, numbness and headaches.  Psychiatric/Behavioral:  Negative for agitation, behavioral problems and confusion.    Physical Exam Updated Vital Signs BP (!) 108/39   Pulse 81   Temp 98.3 F (36.8 C)   Resp (!) 21   Ht 5' 1" (1.549 m)   Wt 59 kg   SpO2 98%   BMI 24.56 kg/m   Physical Exam Vitals and nursing note reviewed.  Constitutional:      General: She is not in acute distress.    Appearance: Normal appearance. She is normal weight. She is not ill-appearing.  HENT:     Head: Normocephalic and atraumatic.     Right Ear: External ear normal.     Left Ear: External ear normal.     Nose: Nose normal. No congestion.     Mouth/Throat:     Mouth: Mucous membranes are moist.     Pharynx: Oropharynx is clear. No oropharyngeal exudate or posterior oropharyngeal erythema.  Eyes:       General: No visual field deficit.    Extraocular Movements: Extraocular movements intact.     Conjunctiva/sclera: Conjunctivae normal.     Pupils: Pupils are equal, round, and reactive to light.  Neck:     Vascular: No carotid bruit.  Cardiovascular:     Rate and  Rhythm: Normal rate and regular rhythm.     Pulses: Normal pulses.     Heart sounds: Normal heart sounds. No murmur heard. Pulmonary:     Effort: Pulmonary effort is normal. No respiratory distress.     Breath sounds: Normal breath sounds. No stridor. No wheezing, rhonchi or rales.  Chest:     Chest wall: No tenderness.  Abdominal:     General: Bowel sounds are normal. There is no distension.     Palpations: Abdomen is soft.     Tenderness: There is no abdominal tenderness. There is no right CVA tenderness, left CVA tenderness, guarding or rebound.  Musculoskeletal:        General: No swelling. Normal range of motion.     Cervical back: Normal range of motion and neck supple. No rigidity, tenderness or bony tenderness.     Thoracic back: Normal. No tenderness or bony tenderness.     Lumbar back: Normal. No tenderness or bony tenderness.     Right hip: Tenderness present.     Right lower leg: No edema.     Left lower leg: No edema.  Skin:    General: Skin is warm and dry.     Coloration: Skin is not jaundiced.  Neurological:     General: No focal deficit present.     Mental Status: She is alert and oriented to person, place, and time. Mental status is at baseline.     Cranial Nerves: Cranial nerves are intact. No cranial nerve deficit, dysarthria or facial asymmetry.     Sensory: Sensation is intact. No sensory deficit.     Motor: Motor function is intact. No weakness.     Coordination: Coordination is intact. Finger-Nose-Finger Test normal.     Gait: Gait is intact. Gait normal.  Psychiatric:        Mood and Affect: Mood normal.        Behavior: Behavior normal.        Thought Content: Thought content normal.        Judgment: Judgment normal.    ED Results / Procedures / Treatments   Labs (all labs ordered are listed, but only abnormal results are displayed) Labs Reviewed  BASIC METABOLIC PANEL - Abnormal; Notable for the following components:      Result Value   Sodium  134 (*)    Glucose, Bld 113 (*)    Calcium 8.1 (*)    All other components within normal limits  CBC - Abnormal; Notable for the following components:   RBC 3.82 (*)    Hemoglobin 11.8 (*)    HCT 35.3 (*)    All other components within normal limits  URINALYSIS, ROUTINE W REFLEX MICROSCOPIC - Abnormal; Notable for the following components:   Color, Urine AMBER (*)    APPearance TURBID (*)    Protein, ur 30 (*)    Nitrite POSITIVE (*)    Leukocytes,Ua LARGE (*)    WBC, UA >50 (*)    Bacteria, UA MANY (*)    Non Squamous Epithelial 0-5 (*)    All other components within normal limits  CBG MONITORING, ED - Abnormal; Notable for the following components:   Glucose-Capillary   212 (*)    All other components within normal limits    EKG EKG Interpretation  Date/Time:  Friday May 28 2021 16:16:58 EDT Ventricular Rate:  77 PR Interval:    QRS Duration: 135 QT Interval:  476 QTC Calculation: 539 R Axis:   87 Text Interpretation: Normal sinus rhythm Nonspecific intraventricular conduction delay Probable anteroseptal infarct, recent Confirmed by Ray, Danielle ([id number]) on 05/28/2021 8:03:13 PM  Radiology CT HEAD W & WO CONTRAST (5MM)  Result Date: 05/28/2021 CLINICAL DATA:  Memory loss and word-finding difficulty EXAM: CT HEAD WITHOUT AND WITH CONTRAST TECHNIQUE: Contiguous axial images were obtained from the base of the skull through the vertex without and with intravenous contrast CONTRAST:  80mL OMNIPAQUE IOHEXOL 350 MG/ML SOLN COMPARISON:  10/07/2019 FINDINGS: Brain: There is no mass, hemorrhage or extra-axial collection. There is generalized atrophy without lobar predilection. Hypodensity of the white matter is most commonly associated with chronic microvascular disease. No abnormal enhancement Vascular: No abnormal hyperdensity of the major intracranial arteries or dural venous sinuses. No intracranial atherosclerosis. Skull: The visualized skull base, calvarium and extracranial soft  tissues are normal. Sinuses/Orbits: No fluid levels or advanced mucosal thickening of the visualized paranasal sinuses. No mastoid or middle ear effusion. The orbits are normal. IMPRESSION: Generalized atrophy and chronic microvascular ischemia without acute intracranial abnormality. Electronically Signed   By: Kevin  Herman M.D.   On: 05/28/2021 19:39   DG Chest Portable 1 View  Result Date: 05/28/2021 CLINICAL DATA:  Weakness EXAM: PORTABLE CHEST 1 VIEW COMPARISON:  October 07, 2019 FINDINGS: The heart size and mediastinal contours are within normal limits. Aortic atherosclerosis. Similar appearance of the soft tissue density in the right paramedian upper lobe and elevation of the right hemidiaphragm, likely post radiation change. No new focal consolidation. No pleural effusion or pneumothorax visualized. The visualized skeletal structures are unchanged. IMPRESSION: No evidence of acute cardiopulmonary disease. Aortic Atherosclerosis (ICD10-I70.0). Electronically Signed   By: Jeffrey  Waltz MD   On: 05/28/2021 17:07   DG Hip Unilat W or Wo Pelvis 2-3 Views Left  Result Date: 05/28/2021 CLINICAL DATA:  weakness EXAM: DG HIP (WITH OR WITHOUT PELVIS) 2-3V LEFT COMPARISON:  Radiograph October 28, 2020 and February 03, 2020 FINDINGS: There is no evidence of hip fracture or dislocation. There is no evidence of arthropathy or other focal bone abnormality. IMPRESSION: Negative. Electronically Signed   By: Jeffrey  Waltz MD   On: 05/28/2021 17:08   DG Femur Min 2 Views Right  Result Date: 05/28/2021 CLINICAL DATA:  weakness EXAM: RIGHT FEMUR 2 VIEWS COMPARISON:  Right hip radiograph October 08, 2019 FINDINGS: There is no evidence of fracture or other focal bone lesions. Soft tissues are unremarkable. IMPRESSION: No acute osseous abnormality. Electronically Signed   By: Jeffrey  Waltz MD   On: 05/28/2021 17:05    Procedures Procedures   Medications Ordered in ED Medications  iohexol (OMNIPAQUE) 350 MG/ML  injection 80 mL (80 mLs Intravenous Contrast Given 05/28/21 1927)  cefTRIAXone (ROCEPHIN) 2 g in sodium chloride 0.9 % 100 mL IVPB (0 g Intravenous Stopped 05/28/21 2330)    ED Course  I have reviewed the triage vital signs and the nursing notes.  Pertinent labs & imaging results that were available during my care of the patient were reviewed by me and considered in my medical decision making (see chart for details).    MDM Rules/Calculators/A&P                           [name] is a 75 y.o. female ith past medical history of non-small cell lung cancer, COPD, CVA that is presenting with her grandson due to word finding difficulties today. Patient is hemodynamically stable and in no acute distress. She is at her mental baseline per the patient and her grandson. Her neurological exam is notable for no focal neuro deficits.  CT head showed no acute intracranial abnormality. CXR showed no acute cardiac or pulmonary abnormality.   Due to her have a right hip hematoma an x-ray was performed that showed no fracutre or dislocation.   Her labs were notable for her UA that was infectious. She was given rocephin in the ED. She was discharged home with keflex for a UTI.   Patient states compliance and understanding of the plan. I explained labs and imaging to the patient. No further questions at this time from the patient.  The patient is safe and stable for discharge at this time with return precautions provided and a plan for follow-up care in place as needed  The plan for this patient was discussed with Dr. Ray, who voiced agreement and who oversaw evaluation and treatment of this patient.   Final Clinical Impression(s) / ED Diagnoses Final diagnoses:  Acute cystitis without hematuria    Rx / DC Orders ED Discharge Orders          Ordered    cephALEXin (KEFLEX) 500 MG capsule  4 times daily        05/28/21 2230             , , MD 05/29/21 0217    Ray, Danielle,  MD 05/31/21 2349  

## 2021-05-28 NOTE — ED Triage Notes (Signed)
Pt here from home via [facility] for headache x 4 days.  Denies photophobia or nausea.  Family stated they called EMS b/c her pain was a zero.  Pt states she has been "fuzzy for a few years".  AO x 4 for ems.  Vs:  126/54 HR 90 RR16 Sats 98% CBG 86

## 2021-05-28 NOTE — ED Notes (Signed)
Cath for urine, notable pyuria, RN updated EDP.

## 2021-05-28 NOTE — Discharge Instructions (Addendum)
Please take keflex four times a day for the next seven days. Please follow up with your primary care provider. Thank you for letting us care for you in the ED.

## 2021-05-31 DIAGNOSIS — Z794 Long term (current) use of insulin: Secondary | ICD-10-CM | POA: Diagnosis not present

## 2021-05-31 DIAGNOSIS — Z7951 Long term (current) use of inhaled steroids: Secondary | ICD-10-CM | POA: Diagnosis not present

## 2021-05-31 DIAGNOSIS — D631 Anemia in chronic kidney disease: Secondary | ICD-10-CM | POA: Diagnosis not present

## 2021-05-31 DIAGNOSIS — I2510 Atherosclerotic heart disease of native coronary artery without angina pectoris: Secondary | ICD-10-CM | POA: Diagnosis not present

## 2021-05-31 DIAGNOSIS — M19042 Primary osteoarthritis, left hand: Secondary | ICD-10-CM | POA: Diagnosis not present

## 2021-05-31 DIAGNOSIS — E11311 Type 2 diabetes mellitus with unspecified diabetic retinopathy with macular edema: Secondary | ICD-10-CM | POA: Diagnosis not present

## 2021-05-31 DIAGNOSIS — I5181 Takotsubo syndrome: Secondary | ICD-10-CM | POA: Diagnosis not present

## 2021-05-31 DIAGNOSIS — E1122 Type 2 diabetes mellitus with diabetic chronic kidney disease: Secondary | ICD-10-CM | POA: Diagnosis not present

## 2021-05-31 DIAGNOSIS — E7800 Pure hypercholesterolemia, unspecified: Secondary | ICD-10-CM | POA: Diagnosis not present

## 2021-05-31 DIAGNOSIS — I447 Left bundle-branch block, unspecified: Secondary | ICD-10-CM | POA: Diagnosis not present

## 2021-05-31 DIAGNOSIS — E1136 Type 2 diabetes mellitus with diabetic cataract: Secondary | ICD-10-CM | POA: Diagnosis not present

## 2021-05-31 DIAGNOSIS — Z9181 History of falling: Secondary | ICD-10-CM | POA: Diagnosis not present

## 2021-05-31 DIAGNOSIS — J449 Chronic obstructive pulmonary disease, unspecified: Secondary | ICD-10-CM | POA: Diagnosis not present

## 2021-05-31 DIAGNOSIS — M810 Age-related osteoporosis without current pathological fracture: Secondary | ICD-10-CM | POA: Diagnosis not present

## 2021-05-31 DIAGNOSIS — K219 Gastro-esophageal reflux disease without esophagitis: Secondary | ICD-10-CM | POA: Diagnosis not present

## 2021-05-31 DIAGNOSIS — Z8673 Personal history of transient ischemic attack (TIA), and cerebral infarction without residual deficits: Secondary | ICD-10-CM | POA: Diagnosis not present

## 2021-05-31 DIAGNOSIS — F32A Depression, unspecified: Secondary | ICD-10-CM | POA: Diagnosis not present

## 2021-05-31 DIAGNOSIS — N183 Chronic kidney disease, stage 3 unspecified: Secondary | ICD-10-CM | POA: Diagnosis not present

## 2021-05-31 DIAGNOSIS — I129 Hypertensive chronic kidney disease with stage 1 through stage 4 chronic kidney disease, or unspecified chronic kidney disease: Secondary | ICD-10-CM | POA: Diagnosis not present

## 2021-05-31 DIAGNOSIS — H25013 Cortical age-related cataract, bilateral: Secondary | ICD-10-CM | POA: Diagnosis not present

## 2021-05-31 DIAGNOSIS — M19041 Primary osteoarthritis, right hand: Secondary | ICD-10-CM | POA: Diagnosis not present

## 2021-05-31 DIAGNOSIS — Z7982 Long term (current) use of aspirin: Secondary | ICD-10-CM | POA: Diagnosis not present

## 2021-06-02 DIAGNOSIS — D631 Anemia in chronic kidney disease: Secondary | ICD-10-CM | POA: Diagnosis not present

## 2021-06-02 DIAGNOSIS — Z8673 Personal history of transient ischemic attack (TIA), and cerebral infarction without residual deficits: Secondary | ICD-10-CM | POA: Diagnosis not present

## 2021-06-02 DIAGNOSIS — M810 Age-related osteoporosis without current pathological fracture: Secondary | ICD-10-CM | POA: Diagnosis not present

## 2021-06-02 DIAGNOSIS — H25013 Cortical age-related cataract, bilateral: Secondary | ICD-10-CM | POA: Diagnosis not present

## 2021-06-02 DIAGNOSIS — Z7951 Long term (current) use of inhaled steroids: Secondary | ICD-10-CM | POA: Diagnosis not present

## 2021-06-02 DIAGNOSIS — M19041 Primary osteoarthritis, right hand: Secondary | ICD-10-CM | POA: Diagnosis not present

## 2021-06-02 DIAGNOSIS — Z7982 Long term (current) use of aspirin: Secondary | ICD-10-CM | POA: Diagnosis not present

## 2021-06-02 DIAGNOSIS — Z9181 History of falling: Secondary | ICD-10-CM | POA: Diagnosis not present

## 2021-06-02 DIAGNOSIS — I2510 Atherosclerotic heart disease of native coronary artery without angina pectoris: Secondary | ICD-10-CM | POA: Diagnosis not present

## 2021-06-02 DIAGNOSIS — K219 Gastro-esophageal reflux disease without esophagitis: Secondary | ICD-10-CM | POA: Diagnosis not present

## 2021-06-02 DIAGNOSIS — Z794 Long term (current) use of insulin: Secondary | ICD-10-CM | POA: Diagnosis not present

## 2021-06-02 DIAGNOSIS — E7800 Pure hypercholesterolemia, unspecified: Secondary | ICD-10-CM | POA: Diagnosis not present

## 2021-06-02 DIAGNOSIS — E1122 Type 2 diabetes mellitus with diabetic chronic kidney disease: Secondary | ICD-10-CM | POA: Diagnosis not present

## 2021-06-02 DIAGNOSIS — I5181 Takotsubo syndrome: Secondary | ICD-10-CM | POA: Diagnosis not present

## 2021-06-02 DIAGNOSIS — N183 Chronic kidney disease, stage 3 unspecified: Secondary | ICD-10-CM | POA: Diagnosis not present

## 2021-06-02 DIAGNOSIS — F32A Depression, unspecified: Secondary | ICD-10-CM | POA: Diagnosis not present

## 2021-06-02 DIAGNOSIS — M19042 Primary osteoarthritis, left hand: Secondary | ICD-10-CM | POA: Diagnosis not present

## 2021-06-02 DIAGNOSIS — I129 Hypertensive chronic kidney disease with stage 1 through stage 4 chronic kidney disease, or unspecified chronic kidney disease: Secondary | ICD-10-CM | POA: Diagnosis not present

## 2021-06-02 DIAGNOSIS — E1136 Type 2 diabetes mellitus with diabetic cataract: Secondary | ICD-10-CM | POA: Diagnosis not present

## 2021-06-02 DIAGNOSIS — I447 Left bundle-branch block, unspecified: Secondary | ICD-10-CM | POA: Diagnosis not present

## 2021-06-02 DIAGNOSIS — E11311 Type 2 diabetes mellitus with unspecified diabetic retinopathy with macular edema: Secondary | ICD-10-CM | POA: Diagnosis not present

## 2021-06-02 DIAGNOSIS — J449 Chronic obstructive pulmonary disease, unspecified: Secondary | ICD-10-CM | POA: Diagnosis not present

## 2021-06-03 ENCOUNTER — Other Ambulatory Visit: Payer: Self-pay

## 2021-06-03 ENCOUNTER — Ambulatory Visit (INDEPENDENT_AMBULATORY_CARE_PROVIDER_SITE_OTHER): Payer: Medicare Other | Admitting: Family Medicine

## 2021-06-03 ENCOUNTER — Encounter: Payer: Self-pay | Admitting: Family Medicine

## 2021-06-03 VITALS — BP 104/65 | HR 87 | Temp 98.1°F | Resp 16 | Ht 62.0 in | Wt 138.6 lb

## 2021-06-03 DIAGNOSIS — F0390 Unspecified dementia without behavioral disturbance: Secondary | ICD-10-CM | POA: Diagnosis not present

## 2021-06-03 DIAGNOSIS — N3090 Cystitis, unspecified without hematuria: Secondary | ICD-10-CM

## 2021-06-03 DIAGNOSIS — R404 Transient alteration of awareness: Secondary | ICD-10-CM | POA: Diagnosis not present

## 2021-06-03 NOTE — Progress Notes (Signed)
OFFICE VISIT  06/03/2021  CC:  Chief Complaint  Patient presents with   Follow-up    ED; visit from 8/5   HPI:    Patient is a 75 y.o. Caucasian female who presents accompanied by her daughter [name] for ED f/u from 05/28/21 (6 days ago). She presented to [facility] ED for brief episode of AMS and word finding difficulty. Had also had a fall onto R hip the week prior. Right hip and femur x-rays neg.  CT head NEG acute.  CXR neg.  EKG neg acute. UA with large leuks, + nitrite.  CBC and BMET normal She was d/c'd home on keflex after getting rocephin in the ED.  INTERIM HX: Daughter says the day prior to ED visit she was having more cognitive slowing and had HA x 3d.  No probs eating/drinking, no urinary complaints. Memory probs the day she went to the ED got much worse so they took her to ED. Taking keflex tid and has been back to normal since the day after ED visit. Never had dysuria.  Has chronic urinary urge incontinence.  She does word finding puzzles at home, watches crime shows on TV---these are her loves in life.   Past Medical History:  Diagnosis Date   Age-related nuclear cataract of both eyes 2016   +cortical age related cataracts OU    Anxiety and depression    Bilateral diabetic retinopathy ([place]) 2015   Dr. 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   COPD (chronic obstructive pulmonary disease) (HCC)    CVA (cerebral vascular accident) ([place]) 09/2016   MRI did show a left-sided small ischemic stroke which was acute but likely incidental (MRI was done b/c pt had TIA sx's in hospital).  Neuro put her on plavix at that time.  Cardiology d/c'd her plavix 08/2017.   Diabetes mellitus with complication (HCC)    diab retinopathy OU (laser)   Finger fracture, right 04/2020   R 5th metacarpal fx at  the base (Dr. Mardelle Matte)   GERD (gastroesophageal reflux disease)    protonix   History of concussion 08/25/2018   w/out loss of consciousness--mild increase in baseline memory impairment after.  CT head neg acute.   History of diverticulitis of colon    with abscess; required IR percutaneous drain placed 09/2016.   History of iron deficiency anemia    started iron 07/02/19.  EGD/colonoscopy 08/27/19 unrevealing. Capsule endoscopy considered but never done.   Hyperlipidemia    Hypertension    Left bundle branch block (LBBB) 12/16/2016   Lung cancer ([place])    non-small cell lung ca, stage III in 05/2011; systemic chemotherapy concurrent with radiation followed by prophylactic cranial irradiation and has been observation since July of 2010 with no evidence for disease recurrence-released from onc f/u 12/2014 (needs annual cxr by PCP).  CXR 08/2015 stable.  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 Earlie Server did bx and this was NEG for malignancy.   Mild cognitive impairment with memory loss    Likely from brain radiation therapy   Non-obstructive CAD (coronary artery disease)    a. Cath 2006 preserved LV fxn, scattered irregularities without critical stenosis; b. 2008 stress echo negative for ischemia, but with hypertensive response; c. 08/2016 Cath: D1 25%, otw nl.   Normocytic anemia 2018   Iron,  vit B12, folate all normal 12/2016.   Open-angle glaucoma 2016   Dr. Shirley Muscat, (bilateral)---responding to topical therapy   Osteoarthritis of both hands    Osteoporosis 03/2016; 03/2018   03/2016 DEXA T-score -2.2.  03/2018 T-score L femoral neck  -2.7--boniva started. DEXA 04/2021 T scor -2.6->boniva continued. Plan rpt DEXA 2 yrs.   Pneumonia    hospitalization 11/2016; hypoxemic resp failure--d/c'd home with home oxygen therapy   Seasonal allergic rhinitis    Subclinical hyperthyroidism 2018   Takotsubo cardiomyopathy    a. 08/2016 Echo: EF 30-35%, gr1DD, PASP 26mHg;  b. 08/2016 Cath: nonobs Dzs;  c. 11/2016  Echo: EF 40-45%, no rwma, nl RV fxn, mild TR, PASP 375mg.  ECHO 02/2017 EF 50-55%, grd II DD---essentially resolved Takotsubo 02/2017.   [place] de pointes ([place]   a. 08/2016 in setting of diverticulitis & pneumoperitoneum and Takotsubo CM -->prolonged QT, seen by EP-->avoid meds with potential for QT prolongation.    Past Surgical History:  Procedure Laterality Date   ABDOMINAL HYSTERECTOMY  1997   APPENDECTOMY     CARDIAC CATHETERIZATION N/A 09/14/2016   Procedure: Left Heart Cath and Coronary Angiography;  Surgeon: ThTroy SineMD;  [facility];  Service: Cardiovascular; Mild nonobstructive CAD with 85% smooth narrowing in the first diagonal branch of the LAD; 10% smooth narrowing of the ostial proximal left circumflex coronary artery; and a normal dominant RCA.   CARPAL TUNNEL RELEASE     CATARACT EXTRACTION Bilateral    CHOLECYSTECTOMY  2002   COLONOSCOPY  10/24/00; 08/27/19   2002 normal.  [employer] hemoccult testing via [facility] 06/18/15 was NEG.  2020->diverticulosis o/w nl.   dexa  03/2016; 03/2018   03/2018 T-score L femoral neck  -2.7 (worsened compared to osteopenic range 2017.) DEXA 05/03/21 stable T score -2.6 on boniva x 2 yrs->plan rpt DEXA 2 yrs.   ESOPHAGOGASTRODUODENOSCOPY  08/27/2019   Normal.   IR GENERIC HISTORICAL  09/19/2016   IR SINUS/FIST TUBE CHK-NON GI 09/19/2016 JaCorrie MckusickDO [facility]   IR GENERIC HISTORICAL  10/06/2016   IR RADIOLOGIST EVAL & MGMT 10/06/2016 [facility]   IR GENERIC HISTORICAL  10/26/2016   IR RADIOLOGIST EVAL & MGMT 10/26/2016 [facility]   IR GENERIC HISTORICAL  11/03/2016   IR RADIOLOGIST EVAL & MGMT 11/03/2016 WeArdis RowanPA-C [facility]   IR GENERIC HISTORICAL  11/24/2016   IR RADIOLOGIST EVAL & MGMT 11/24/2016 WeArdis RowanPA-C [facility]   IR GENERIC HISTORICAL  12/05/2016   Fistula smaller/improving.  IR SINUS/FIST TUBE CHK-NON GI 12/05/2016 JoSandi MariscalMD [facility]    IR GENERIC HISTORICAL  12/22/2016   IR RADIOLOGIST EVAL & MGMT 12/22/2016 MiGreggory KeenMD [facility]   IR RADIOLOGIST EVAL & MGMT  01/10/2017   IR RADIOLOGIST EVAL & MGMT  02/09/2017   IR SINUS/FIST TUBE CHK-NON GI  03/28/2017   LEFT HEART CATHETERIZATION WITH CORONARY ANGIOGRAM N/A 05/30/2012   Procedure: LEFT HEART CATHETERIZATION WITH CORONARY ANGIOGRAM;  Surgeon: ChBurnell BlanksMD;  [facility];  Service: Cardiovascular: Mild, non-obstructive CAD   TRANSTHORACIC ECHOCARDIOGRAM  09/14/2016; 11/2016   08/2016: EF 30-35 %, Akinesis of the mid-apicalanteroseptal myocardium.  Grade I DD.  Mild pulm HTN.  Repeat echo 11/2016, EF 40-45%, no rwma, nl RV fxn, mild TR, PASP 3770m.     TRANSTHORACIC ECHOCARDIOGRAM  03/07/2017   EF 50-55%. Hypokinesis of the distal septum with overall low  normal LV,  systolic function; mild diastolic dysfunction with elevated LV  filling pressure; mildly calcified aortic valve with mild AI; small pericardial effusion    Outpatient Medications Prior to Visit  Medication Sig Dispense Refill   albuterol (PROVENTIL) (2.5 MG/3ML) 0.083% nebulizer solution USE 1 VIAL IN NEBULIZER EVERY 4 HOURS AS NEEDED FOR WHEEZING AND FOR SHORTNESS OF BREATH 90 mL 1   albuterol (VENTOLIN HFA) 108 (90 Base) MCG/ACT inhaler INHALE 2 PUFFS BY MOUTH EVERY 6 HOURS AS NEEDED FOR WHEEZING OR SHORTNESS OF BREATH 18 g 1   aspirin 81 MG EC tablet Take 81 mg by mouth daily.      atorvastatin (LIPITOR) 40 MG tablet Take 1 tablet by mouth once daily 90 tablet 1   buPROPion (WELLBUTRIN XL) 150 MG 24 hr tablet Take 1 tablet by mouth once daily 90 tablet 0   cephALEXin (KEFLEX) 500 MG capsule Take 1 capsule (500 mg total) by mouth 4 (four) times daily. 28 capsule 0   cetirizine (ZYRTEC) 10 MG tablet Take 10 mg by mouth daily.     escitalopram (LEXAPRO) 20 MG tablet Take 1 tablet by mouth once daily 90 tablet 0   fluticasone (FLOVENT HFA) 220 MCG/ACT inhaler Inhale 2 puffs into  the lungs 2 (two) times daily. 12 g 11   ibandronate (BONIVA) 150 MG tablet Take 1 tablet (150 mg total) by mouth every 30 (thirty) days. Take in the morning with a full glass of water, on an empty stomach, and do not take anything else by mouth or lie down for the next 30 min. 3 tablet 3   insulin isophane & regular human (HUMULIN 70/30 KWIKPEN) (70-30) 100 UNIT/ML KwikPen INJECT 4 UNITS SUBCUTANEOUSLY WITH BREAKFAST AND 2 UNITS WITH SUPPER 15 mL 1   Lancets (ONETOUCH DELICA PLUS TIRWER15Q) MISC USE 1  TO CHECK GLUCOSE THREE TIMES DAILY 100 each 5   metoprolol tartrate (LOPRESSOR) 25 MG tablet 1/2 tab po bid 90 tablet 3   Multiple Vitamins-Minerals (MULTIVITAMIN WOMEN PO) Take by mouth daily.     ONETOUCH ULTRA test strip USE 1 STRIP TO CHECK GLUCOSE THREE TIMES DAILY 100 each 11   pantoprazole (PROTONIX) 40 MG tablet Take 1 tablet by mouth once daily 90 tablet 0   No facility-administered medications prior to visit.    Allergies  Allergen Reactions   Lactose Intolerance (Gi) Diarrhea   Ibuprofen Other (See Comments)    REACTION: Anxiousness, hyperventilates    ROS As per HPI  PE: Vitals with BMI 06/03/2021 05/28/2021 05/28/2021  Height 5' 2"  - -  Weight 138 lbs 10 oz - -  BMI 00.86 - -  Systolic 761 950 932  Diastolic 65 39 63  Pulse 87 81 83   Gen: Alert, well appearing.  Patient is oriented to person, place, general situation and time. AFFECT: pleasant, lucid thought and speech.  Attends well. IZT:IWPY: no injection, icteris, swelling, or exudate.  EOMI, PERRLA. Mouth: lips without lesion/swelling.  Oral mucosa pink and moist. Oropharynx without erythema, exudate, or swelling.  Neck: no bruits. CV: RRR, no m/r/g.   LUNGS: CTA bilat, nonlabored resps, good aeration in all lung fields. Neuro: CN 2-12 intact bilaterally, strength 5/5 in proximal and distal upper extremities and lower extremities bilaterally. No tremor.  She can get herself up from chair with pushing 2-3 times,  balance is fair standing but sometimes sways a little to side or back when walking.  Takes small slow steps.  No pronator drift.  LABS:  Lab Results  Component Value Date   WBC 6.9 05/28/2021   HGB 11.8 (L) 05/28/2021   HCT 35.3 (L) 05/28/2021   MCV 92.4 05/28/2021   PLT 200 05/28/2021     Chemistry      Component Value Date/Time   NA 134 (L) 05/28/2021 1800   NA 141 09/19/2018 0918   NA 141 01/15/2015 0848   K 4.3 05/28/2021 1800   K 4.0 01/15/2015 0848   CL 103 05/28/2021 1800   CL 105 02/18/2013 0812   CO2 25 05/28/2021 1800   CO2 25 01/15/2015 0848   BUN 16 05/28/2021 1800   BUN 16 09/19/2018 0918   BUN 9.0 01/15/2015 0848   CREATININE 0.90 05/28/2021 1800   CREATININE 0.95 (H) 01/27/2021 0346   CREATININE 0.7 01/15/2015 0848      Component Value Date/Time   CALCIUM 8.1 (L) 05/28/2021 1800   CALCIUM 9.2 01/15/2015 0848   ALKPHOS 76 04/29/2021 1024   ALKPHOS 92 01/15/2015 0848   AST 16 04/29/2021 1024   AST 19 01/15/2015 0848   ALT 12 04/29/2021 1024   ALT 20 01/15/2015 0848   BILITOT 0.4 04/29/2021 1024   BILITOT 0.3 09/19/2018 0918   BILITOT 0.34 01/15/2015 0848     Lab Results  Component Value Date   HGBA1C 6.4 04/29/2021   IMPRESSION AND PLAN:  1) AMS, suspect secondary to UTI.  No urine culture was ordered by the ED. She has been back to baseline and feeling fine since the day after her ED visit. Eval in ED reassuring otherwise.  I don't feel that any further evaluation is needed at this time. Finish abx.  An After Visit Summary was printed and given to the patient.  FOLLOW UP: Return for has routine f/u appt already. Has 3 mo [facility] f/u appt set for 07/20/21  Signed:  Crissie Sickles, MD           06/03/2021

## 2021-06-04 ENCOUNTER — Telehealth: Payer: Self-pay | Admitting: Family Medicine

## 2021-06-04 ENCOUNTER — Other Ambulatory Visit: Payer: Self-pay | Admitting: *Deleted

## 2021-06-04 DIAGNOSIS — D631 Anemia in chronic kidney disease: Secondary | ICD-10-CM | POA: Diagnosis not present

## 2021-06-04 DIAGNOSIS — I129 Hypertensive chronic kidney disease with stage 1 through stage 4 chronic kidney disease, or unspecified chronic kidney disease: Secondary | ICD-10-CM | POA: Diagnosis not present

## 2021-06-04 DIAGNOSIS — K219 Gastro-esophageal reflux disease without esophagitis: Secondary | ICD-10-CM | POA: Diagnosis not present

## 2021-06-04 DIAGNOSIS — M19041 Primary osteoarthritis, right hand: Secondary | ICD-10-CM | POA: Diagnosis not present

## 2021-06-04 DIAGNOSIS — Z7982 Long term (current) use of aspirin: Secondary | ICD-10-CM | POA: Diagnosis not present

## 2021-06-04 DIAGNOSIS — Z794 Long term (current) use of insulin: Secondary | ICD-10-CM | POA: Diagnosis not present

## 2021-06-04 DIAGNOSIS — E7800 Pure hypercholesterolemia, unspecified: Secondary | ICD-10-CM | POA: Diagnosis not present

## 2021-06-04 DIAGNOSIS — F32A Depression, unspecified: Secondary | ICD-10-CM | POA: Diagnosis not present

## 2021-06-04 DIAGNOSIS — Z9181 History of falling: Secondary | ICD-10-CM | POA: Diagnosis not present

## 2021-06-04 DIAGNOSIS — E11311 Type 2 diabetes mellitus with unspecified diabetic retinopathy with macular edema: Secondary | ICD-10-CM | POA: Diagnosis not present

## 2021-06-04 DIAGNOSIS — I447 Left bundle-branch block, unspecified: Secondary | ICD-10-CM | POA: Diagnosis not present

## 2021-06-04 DIAGNOSIS — I2510 Atherosclerotic heart disease of native coronary artery without angina pectoris: Secondary | ICD-10-CM | POA: Diagnosis not present

## 2021-06-04 DIAGNOSIS — E1122 Type 2 diabetes mellitus with diabetic chronic kidney disease: Secondary | ICD-10-CM | POA: Diagnosis not present

## 2021-06-04 DIAGNOSIS — I5181 Takotsubo syndrome: Secondary | ICD-10-CM | POA: Diagnosis not present

## 2021-06-04 DIAGNOSIS — N183 Chronic kidney disease, stage 3 unspecified: Secondary | ICD-10-CM | POA: Diagnosis not present

## 2021-06-04 DIAGNOSIS — E1136 Type 2 diabetes mellitus with diabetic cataract: Secondary | ICD-10-CM | POA: Diagnosis not present

## 2021-06-04 DIAGNOSIS — M19042 Primary osteoarthritis, left hand: Secondary | ICD-10-CM | POA: Diagnosis not present

## 2021-06-04 DIAGNOSIS — Z8673 Personal history of transient ischemic attack (TIA), and cerebral infarction without residual deficits: Secondary | ICD-10-CM | POA: Diagnosis not present

## 2021-06-04 DIAGNOSIS — M810 Age-related osteoporosis without current pathological fracture: Secondary | ICD-10-CM | POA: Diagnosis not present

## 2021-06-04 DIAGNOSIS — H25013 Cortical age-related cataract, bilateral: Secondary | ICD-10-CM | POA: Diagnosis not present

## 2021-06-04 DIAGNOSIS — J449 Chronic obstructive pulmonary disease, unspecified: Secondary | ICD-10-CM | POA: Diagnosis not present

## 2021-06-04 DIAGNOSIS — Z7951 Long term (current) use of inhaled steroids: Secondary | ICD-10-CM | POA: Diagnosis not present

## 2021-06-04 NOTE — Telephone Encounter (Signed)
Signed and put in box to go up front. Signed:  Crissie Sickles, MD           06/04/2021

## 2021-06-04 NOTE — Telephone Encounter (Signed)
Placed on PCP desk to review and sign, if appropriate.  

## 2021-06-04 NOTE — Telephone Encounter (Signed)
Received faxed orders from [place]. Placed in Dr. Idelle Leech front office inbox to be signed and returned.

## 2021-06-04 NOTE — Patient Outreach (Signed)
[facility]) Care Management  06/04/2021  [name] 1946/05/09 [id number]   Outgoing call placed to daughter, report member is doing much better.  Denies any urgent concerns, encouraged to contact this care manager with questions.  Agrees to follow up within the next month.   Goals Addressed             This Visit's Progress    [facility] - Make and Keep All Appointments   On track    Timeframe:  Short-Term Goal Priority:  Medium Start Date:          5/31                   Expected End Date:       9/31 (goal extended)              Barriers: Knowledge   - call to cancel if needed - keep a calendar with appointment dates    Why is this important?   Part of staying healthy is seeing the doctor for follow-up care.  If you forget your appointments, there are some things you can do to stay on track.    Notes:   5/31- Reviewed appointments with daughter, 6/3 with eye doctor and 6/27 with PCP  6/13 - Reminded daughter of PCP appointment scheduled for 6/27.    7/12 - Confirmed with daughter member attended follow up appointment with PCP, next appointment in September.  State  she discussed concern for member's slow mental decline, per notes this is to be expected.  No medication changes at this time, daughter verbalizes understanding on disease process.  8/12 - Was seen by PCP yesterday as follow up from UTI, remains on antibiotics.  Educated on signs/symptoms of infection that may not present as typical infection (confusion versus normal fever/burning with urination).  Will follow up with PCP on 9/27     [facility] - Prevent Falls and Injury   On track    Follow Up Date 6/13  Timeframe:  Long-Range Goal Priority:  High Start Date:     5/31                        Expected End Date:        8/31               Barriers: Other - decreased strength    - make an emergency alert plan in case I fall - use a cane or walker - use a nightlight in the bathroom    Why  is this important?   Most falls happen when it is hard for you to walk safely. Your balance may be off because of an illness. You may have pain in your knees, hip or other joints.  You may be overly tired or taking medicines that make you sleepy. You may not be able to see or hear clearly.  Falls can lead to broken bones, bruises or other injuries.  There are things you can do to help prevent falling.     Notes:   5/31 - Call placed to PCP office to request orders for new walker and home health PT  6/13 - Confirmed member has new walker, call placed to PCP office to request referral for home health PT  7/12 - Still has not started PT.  Noted per chart, referral was sent [facility], they were unable to take due to staffing.  Call placed to PCP office  to inquire about process.  Notified it was sent to [facility].  Call placed to [facility], they have not received referral.  Call placed back to PCP, advised of need to re-send referral, provided fax numbers.  8/12 - Confirmed with daughter that home health PT has started through [place].  She had 3 visits last week, visits will resume next week.        [name], [place], MSN [place] [phone]

## 2021-06-06 ENCOUNTER — Other Ambulatory Visit: Payer: Self-pay | Admitting: Family Medicine

## 2021-06-08 DIAGNOSIS — E1122 Type 2 diabetes mellitus with diabetic chronic kidney disease: Secondary | ICD-10-CM | POA: Diagnosis not present

## 2021-06-08 DIAGNOSIS — K219 Gastro-esophageal reflux disease without esophagitis: Secondary | ICD-10-CM | POA: Diagnosis not present

## 2021-06-08 DIAGNOSIS — D631 Anemia in chronic kidney disease: Secondary | ICD-10-CM | POA: Diagnosis not present

## 2021-06-08 DIAGNOSIS — Z8673 Personal history of transient ischemic attack (TIA), and cerebral infarction without residual deficits: Secondary | ICD-10-CM | POA: Diagnosis not present

## 2021-06-08 DIAGNOSIS — M810 Age-related osteoporosis without current pathological fracture: Secondary | ICD-10-CM | POA: Diagnosis not present

## 2021-06-08 DIAGNOSIS — H25013 Cortical age-related cataract, bilateral: Secondary | ICD-10-CM | POA: Diagnosis not present

## 2021-06-08 DIAGNOSIS — M19042 Primary osteoarthritis, left hand: Secondary | ICD-10-CM | POA: Diagnosis not present

## 2021-06-08 DIAGNOSIS — N183 Chronic kidney disease, stage 3 unspecified: Secondary | ICD-10-CM | POA: Diagnosis not present

## 2021-06-08 DIAGNOSIS — E1136 Type 2 diabetes mellitus with diabetic cataract: Secondary | ICD-10-CM | POA: Diagnosis not present

## 2021-06-08 DIAGNOSIS — Z7951 Long term (current) use of inhaled steroids: Secondary | ICD-10-CM | POA: Diagnosis not present

## 2021-06-08 DIAGNOSIS — Z9181 History of falling: Secondary | ICD-10-CM | POA: Diagnosis not present

## 2021-06-08 DIAGNOSIS — F32A Depression, unspecified: Secondary | ICD-10-CM | POA: Diagnosis not present

## 2021-06-08 DIAGNOSIS — Z7982 Long term (current) use of aspirin: Secondary | ICD-10-CM | POA: Diagnosis not present

## 2021-06-08 DIAGNOSIS — E7800 Pure hypercholesterolemia, unspecified: Secondary | ICD-10-CM | POA: Diagnosis not present

## 2021-06-08 DIAGNOSIS — Z794 Long term (current) use of insulin: Secondary | ICD-10-CM | POA: Diagnosis not present

## 2021-06-08 DIAGNOSIS — E11311 Type 2 diabetes mellitus with unspecified diabetic retinopathy with macular edema: Secondary | ICD-10-CM | POA: Diagnosis not present

## 2021-06-08 DIAGNOSIS — I5181 Takotsubo syndrome: Secondary | ICD-10-CM | POA: Diagnosis not present

## 2021-06-08 DIAGNOSIS — J449 Chronic obstructive pulmonary disease, unspecified: Secondary | ICD-10-CM | POA: Diagnosis not present

## 2021-06-08 DIAGNOSIS — I129 Hypertensive chronic kidney disease with stage 1 through stage 4 chronic kidney disease, or unspecified chronic kidney disease: Secondary | ICD-10-CM | POA: Diagnosis not present

## 2021-06-08 DIAGNOSIS — I447 Left bundle-branch block, unspecified: Secondary | ICD-10-CM | POA: Diagnosis not present

## 2021-06-08 DIAGNOSIS — I2510 Atherosclerotic heart disease of native coronary artery without angina pectoris: Secondary | ICD-10-CM | POA: Diagnosis not present

## 2021-06-08 DIAGNOSIS — M19041 Primary osteoarthritis, right hand: Secondary | ICD-10-CM | POA: Diagnosis not present

## 2021-06-10 DIAGNOSIS — Z7982 Long term (current) use of aspirin: Secondary | ICD-10-CM | POA: Diagnosis not present

## 2021-06-10 DIAGNOSIS — D631 Anemia in chronic kidney disease: Secondary | ICD-10-CM | POA: Diagnosis not present

## 2021-06-10 DIAGNOSIS — K219 Gastro-esophageal reflux disease without esophagitis: Secondary | ICD-10-CM | POA: Diagnosis not present

## 2021-06-10 DIAGNOSIS — I2510 Atherosclerotic heart disease of native coronary artery without angina pectoris: Secondary | ICD-10-CM | POA: Diagnosis not present

## 2021-06-10 DIAGNOSIS — E11311 Type 2 diabetes mellitus with unspecified diabetic retinopathy with macular edema: Secondary | ICD-10-CM | POA: Diagnosis not present

## 2021-06-10 DIAGNOSIS — N183 Chronic kidney disease, stage 3 unspecified: Secondary | ICD-10-CM | POA: Diagnosis not present

## 2021-06-10 DIAGNOSIS — I5181 Takotsubo syndrome: Secondary | ICD-10-CM | POA: Diagnosis not present

## 2021-06-10 DIAGNOSIS — E1136 Type 2 diabetes mellitus with diabetic cataract: Secondary | ICD-10-CM | POA: Diagnosis not present

## 2021-06-10 DIAGNOSIS — M19041 Primary osteoarthritis, right hand: Secondary | ICD-10-CM | POA: Diagnosis not present

## 2021-06-10 DIAGNOSIS — M810 Age-related osteoporosis without current pathological fracture: Secondary | ICD-10-CM | POA: Diagnosis not present

## 2021-06-10 DIAGNOSIS — Z9181 History of falling: Secondary | ICD-10-CM | POA: Diagnosis not present

## 2021-06-10 DIAGNOSIS — M19042 Primary osteoarthritis, left hand: Secondary | ICD-10-CM | POA: Diagnosis not present

## 2021-06-10 DIAGNOSIS — Z8673 Personal history of transient ischemic attack (TIA), and cerebral infarction without residual deficits: Secondary | ICD-10-CM | POA: Diagnosis not present

## 2021-06-10 DIAGNOSIS — J449 Chronic obstructive pulmonary disease, unspecified: Secondary | ICD-10-CM | POA: Diagnosis not present

## 2021-06-10 DIAGNOSIS — F32A Depression, unspecified: Secondary | ICD-10-CM | POA: Diagnosis not present

## 2021-06-10 DIAGNOSIS — H25013 Cortical age-related cataract, bilateral: Secondary | ICD-10-CM | POA: Diagnosis not present

## 2021-06-10 DIAGNOSIS — E1122 Type 2 diabetes mellitus with diabetic chronic kidney disease: Secondary | ICD-10-CM | POA: Diagnosis not present

## 2021-06-10 DIAGNOSIS — I129 Hypertensive chronic kidney disease with stage 1 through stage 4 chronic kidney disease, or unspecified chronic kidney disease: Secondary | ICD-10-CM | POA: Diagnosis not present

## 2021-06-10 DIAGNOSIS — I447 Left bundle-branch block, unspecified: Secondary | ICD-10-CM | POA: Diagnosis not present

## 2021-06-10 DIAGNOSIS — Z794 Long term (current) use of insulin: Secondary | ICD-10-CM | POA: Diagnosis not present

## 2021-06-10 DIAGNOSIS — E7800 Pure hypercholesterolemia, unspecified: Secondary | ICD-10-CM | POA: Diagnosis not present

## 2021-06-10 DIAGNOSIS — Z7951 Long term (current) use of inhaled steroids: Secondary | ICD-10-CM | POA: Diagnosis not present

## 2021-06-11 ENCOUNTER — Telehealth: Payer: Self-pay | Admitting: Family Medicine

## 2021-06-11 NOTE — Telephone Encounter (Signed)
Signed and put in box to go up front. Signed:  Crissie Sickles, MD           06/11/2021

## 2021-06-11 NOTE — Telephone Encounter (Signed)
Received faxed orders from [place]. Placed in Dr. Idelle Leech front office inbox to be signed and returned.

## 2021-06-11 NOTE — Telephone Encounter (Signed)
Home health orders received 06/11/21 for [place] health initiation orders: Yes.  Home health re-certification orders: No. Patient last seen by ordering physician for this condition: 06/03/21. Must be less than 90 days for re-certification and less than 30 days prior for initiation. Visit must have been for the condition the orders are being placed.  Patient meets criteria for Physician to sign orders: Yes.        Current med list has been attached: No        Orders placed on physicians desk for signature: 06/11/21 (date) If patient does not meet criteria for orders to be signed: pt was called to schedule appt. Appt is scheduled for 07/20/21.   Emilee Hero

## 2021-06-14 NOTE — Telephone Encounter (Signed)
Faxed signed form to [place]. Fax confirmation received.

## 2021-06-15 DIAGNOSIS — Z794 Long term (current) use of insulin: Secondary | ICD-10-CM | POA: Diagnosis not present

## 2021-06-15 DIAGNOSIS — I447 Left bundle-branch block, unspecified: Secondary | ICD-10-CM | POA: Diagnosis not present

## 2021-06-15 DIAGNOSIS — E1136 Type 2 diabetes mellitus with diabetic cataract: Secondary | ICD-10-CM | POA: Diagnosis not present

## 2021-06-15 DIAGNOSIS — E1122 Type 2 diabetes mellitus with diabetic chronic kidney disease: Secondary | ICD-10-CM | POA: Diagnosis not present

## 2021-06-15 DIAGNOSIS — E11311 Type 2 diabetes mellitus with unspecified diabetic retinopathy with macular edema: Secondary | ICD-10-CM | POA: Diagnosis not present

## 2021-06-15 DIAGNOSIS — F32A Depression, unspecified: Secondary | ICD-10-CM | POA: Diagnosis not present

## 2021-06-15 DIAGNOSIS — N183 Chronic kidney disease, stage 3 unspecified: Secondary | ICD-10-CM | POA: Diagnosis not present

## 2021-06-15 DIAGNOSIS — M19042 Primary osteoarthritis, left hand: Secondary | ICD-10-CM | POA: Diagnosis not present

## 2021-06-15 DIAGNOSIS — H25013 Cortical age-related cataract, bilateral: Secondary | ICD-10-CM | POA: Diagnosis not present

## 2021-06-15 DIAGNOSIS — Z7982 Long term (current) use of aspirin: Secondary | ICD-10-CM | POA: Diagnosis not present

## 2021-06-15 DIAGNOSIS — M810 Age-related osteoporosis without current pathological fracture: Secondary | ICD-10-CM | POA: Diagnosis not present

## 2021-06-15 DIAGNOSIS — I2510 Atherosclerotic heart disease of native coronary artery without angina pectoris: Secondary | ICD-10-CM | POA: Diagnosis not present

## 2021-06-15 DIAGNOSIS — Z7951 Long term (current) use of inhaled steroids: Secondary | ICD-10-CM | POA: Diagnosis not present

## 2021-06-15 DIAGNOSIS — M19041 Primary osteoarthritis, right hand: Secondary | ICD-10-CM | POA: Diagnosis not present

## 2021-06-15 DIAGNOSIS — D631 Anemia in chronic kidney disease: Secondary | ICD-10-CM | POA: Diagnosis not present

## 2021-06-15 DIAGNOSIS — Z9181 History of falling: Secondary | ICD-10-CM | POA: Diagnosis not present

## 2021-06-15 DIAGNOSIS — I129 Hypertensive chronic kidney disease with stage 1 through stage 4 chronic kidney disease, or unspecified chronic kidney disease: Secondary | ICD-10-CM | POA: Diagnosis not present

## 2021-06-15 DIAGNOSIS — K219 Gastro-esophageal reflux disease without esophagitis: Secondary | ICD-10-CM | POA: Diagnosis not present

## 2021-06-15 DIAGNOSIS — Z8673 Personal history of transient ischemic attack (TIA), and cerebral infarction without residual deficits: Secondary | ICD-10-CM | POA: Diagnosis not present

## 2021-06-15 DIAGNOSIS — I5181 Takotsubo syndrome: Secondary | ICD-10-CM | POA: Diagnosis not present

## 2021-06-15 DIAGNOSIS — E7800 Pure hypercholesterolemia, unspecified: Secondary | ICD-10-CM | POA: Diagnosis not present

## 2021-06-15 DIAGNOSIS — J449 Chronic obstructive pulmonary disease, unspecified: Secondary | ICD-10-CM | POA: Diagnosis not present

## 2021-06-17 DIAGNOSIS — I5181 Takotsubo syndrome: Secondary | ICD-10-CM | POA: Diagnosis not present

## 2021-06-17 DIAGNOSIS — E1122 Type 2 diabetes mellitus with diabetic chronic kidney disease: Secondary | ICD-10-CM | POA: Diagnosis not present

## 2021-06-17 DIAGNOSIS — Z7951 Long term (current) use of inhaled steroids: Secondary | ICD-10-CM | POA: Diagnosis not present

## 2021-06-17 DIAGNOSIS — I129 Hypertensive chronic kidney disease with stage 1 through stage 4 chronic kidney disease, or unspecified chronic kidney disease: Secondary | ICD-10-CM | POA: Diagnosis not present

## 2021-06-17 DIAGNOSIS — J449 Chronic obstructive pulmonary disease, unspecified: Secondary | ICD-10-CM | POA: Diagnosis not present

## 2021-06-17 DIAGNOSIS — H25013 Cortical age-related cataract, bilateral: Secondary | ICD-10-CM | POA: Diagnosis not present

## 2021-06-17 DIAGNOSIS — Z7982 Long term (current) use of aspirin: Secondary | ICD-10-CM | POA: Diagnosis not present

## 2021-06-17 DIAGNOSIS — I447 Left bundle-branch block, unspecified: Secondary | ICD-10-CM | POA: Diagnosis not present

## 2021-06-17 DIAGNOSIS — N183 Chronic kidney disease, stage 3 unspecified: Secondary | ICD-10-CM | POA: Diagnosis not present

## 2021-06-17 DIAGNOSIS — Z9181 History of falling: Secondary | ICD-10-CM | POA: Diagnosis not present

## 2021-06-17 DIAGNOSIS — D631 Anemia in chronic kidney disease: Secondary | ICD-10-CM | POA: Diagnosis not present

## 2021-06-17 DIAGNOSIS — M19042 Primary osteoarthritis, left hand: Secondary | ICD-10-CM | POA: Diagnosis not present

## 2021-06-17 DIAGNOSIS — I2510 Atherosclerotic heart disease of native coronary artery without angina pectoris: Secondary | ICD-10-CM | POA: Diagnosis not present

## 2021-06-17 DIAGNOSIS — F32A Depression, unspecified: Secondary | ICD-10-CM | POA: Diagnosis not present

## 2021-06-17 DIAGNOSIS — Z8673 Personal history of transient ischemic attack (TIA), and cerebral infarction without residual deficits: Secondary | ICD-10-CM | POA: Diagnosis not present

## 2021-06-17 DIAGNOSIS — E11311 Type 2 diabetes mellitus with unspecified diabetic retinopathy with macular edema: Secondary | ICD-10-CM | POA: Diagnosis not present

## 2021-06-17 DIAGNOSIS — E7800 Pure hypercholesterolemia, unspecified: Secondary | ICD-10-CM | POA: Diagnosis not present

## 2021-06-17 DIAGNOSIS — M810 Age-related osteoporosis without current pathological fracture: Secondary | ICD-10-CM | POA: Diagnosis not present

## 2021-06-17 DIAGNOSIS — M19041 Primary osteoarthritis, right hand: Secondary | ICD-10-CM | POA: Diagnosis not present

## 2021-06-17 DIAGNOSIS — E1136 Type 2 diabetes mellitus with diabetic cataract: Secondary | ICD-10-CM | POA: Diagnosis not present

## 2021-06-17 DIAGNOSIS — K219 Gastro-esophageal reflux disease without esophagitis: Secondary | ICD-10-CM | POA: Diagnosis not present

## 2021-06-17 DIAGNOSIS — Z794 Long term (current) use of insulin: Secondary | ICD-10-CM | POA: Diagnosis not present

## 2021-06-21 DIAGNOSIS — E7800 Pure hypercholesterolemia, unspecified: Secondary | ICD-10-CM | POA: Diagnosis not present

## 2021-06-21 DIAGNOSIS — Z794 Long term (current) use of insulin: Secondary | ICD-10-CM | POA: Diagnosis not present

## 2021-06-21 DIAGNOSIS — M19041 Primary osteoarthritis, right hand: Secondary | ICD-10-CM | POA: Diagnosis not present

## 2021-06-21 DIAGNOSIS — E1122 Type 2 diabetes mellitus with diabetic chronic kidney disease: Secondary | ICD-10-CM | POA: Diagnosis not present

## 2021-06-21 DIAGNOSIS — I129 Hypertensive chronic kidney disease with stage 1 through stage 4 chronic kidney disease, or unspecified chronic kidney disease: Secondary | ICD-10-CM | POA: Diagnosis not present

## 2021-06-21 DIAGNOSIS — Z9181 History of falling: Secondary | ICD-10-CM | POA: Diagnosis not present

## 2021-06-21 DIAGNOSIS — I5181 Takotsubo syndrome: Secondary | ICD-10-CM | POA: Diagnosis not present

## 2021-06-21 DIAGNOSIS — H25013 Cortical age-related cataract, bilateral: Secondary | ICD-10-CM | POA: Diagnosis not present

## 2021-06-21 DIAGNOSIS — I447 Left bundle-branch block, unspecified: Secondary | ICD-10-CM | POA: Diagnosis not present

## 2021-06-21 DIAGNOSIS — N183 Chronic kidney disease, stage 3 unspecified: Secondary | ICD-10-CM | POA: Diagnosis not present

## 2021-06-21 DIAGNOSIS — E11311 Type 2 diabetes mellitus with unspecified diabetic retinopathy with macular edema: Secondary | ICD-10-CM | POA: Diagnosis not present

## 2021-06-21 DIAGNOSIS — J449 Chronic obstructive pulmonary disease, unspecified: Secondary | ICD-10-CM | POA: Diagnosis not present

## 2021-06-21 DIAGNOSIS — I2510 Atherosclerotic heart disease of native coronary artery without angina pectoris: Secondary | ICD-10-CM | POA: Diagnosis not present

## 2021-06-21 DIAGNOSIS — K219 Gastro-esophageal reflux disease without esophagitis: Secondary | ICD-10-CM | POA: Diagnosis not present

## 2021-06-21 DIAGNOSIS — E1136 Type 2 diabetes mellitus with diabetic cataract: Secondary | ICD-10-CM | POA: Diagnosis not present

## 2021-06-21 DIAGNOSIS — F32A Depression, unspecified: Secondary | ICD-10-CM | POA: Diagnosis not present

## 2021-06-21 DIAGNOSIS — M810 Age-related osteoporosis without current pathological fracture: Secondary | ICD-10-CM | POA: Diagnosis not present

## 2021-06-21 DIAGNOSIS — Z7951 Long term (current) use of inhaled steroids: Secondary | ICD-10-CM | POA: Diagnosis not present

## 2021-06-21 DIAGNOSIS — M19042 Primary osteoarthritis, left hand: Secondary | ICD-10-CM | POA: Diagnosis not present

## 2021-06-21 DIAGNOSIS — Z8673 Personal history of transient ischemic attack (TIA), and cerebral infarction without residual deficits: Secondary | ICD-10-CM | POA: Diagnosis not present

## 2021-06-21 DIAGNOSIS — D631 Anemia in chronic kidney disease: Secondary | ICD-10-CM | POA: Diagnosis not present

## 2021-06-21 DIAGNOSIS — Z7982 Long term (current) use of aspirin: Secondary | ICD-10-CM | POA: Diagnosis not present

## 2021-06-24 ENCOUNTER — Telehealth: Payer: Self-pay | Admitting: Family Medicine

## 2021-06-24 DIAGNOSIS — K219 Gastro-esophageal reflux disease without esophagitis: Secondary | ICD-10-CM | POA: Diagnosis not present

## 2021-06-24 DIAGNOSIS — E7800 Pure hypercholesterolemia, unspecified: Secondary | ICD-10-CM | POA: Diagnosis not present

## 2021-06-24 DIAGNOSIS — Z7951 Long term (current) use of inhaled steroids: Secondary | ICD-10-CM | POA: Diagnosis not present

## 2021-06-24 DIAGNOSIS — E1122 Type 2 diabetes mellitus with diabetic chronic kidney disease: Secondary | ICD-10-CM | POA: Diagnosis not present

## 2021-06-24 DIAGNOSIS — I2510 Atherosclerotic heart disease of native coronary artery without angina pectoris: Secondary | ICD-10-CM | POA: Diagnosis not present

## 2021-06-24 DIAGNOSIS — N183 Chronic kidney disease, stage 3 unspecified: Secondary | ICD-10-CM | POA: Diagnosis not present

## 2021-06-24 DIAGNOSIS — F32A Depression, unspecified: Secondary | ICD-10-CM | POA: Diagnosis not present

## 2021-06-24 DIAGNOSIS — I447 Left bundle-branch block, unspecified: Secondary | ICD-10-CM | POA: Diagnosis not present

## 2021-06-24 DIAGNOSIS — E1136 Type 2 diabetes mellitus with diabetic cataract: Secondary | ICD-10-CM | POA: Diagnosis not present

## 2021-06-24 DIAGNOSIS — H25013 Cortical age-related cataract, bilateral: Secondary | ICD-10-CM | POA: Diagnosis not present

## 2021-06-24 DIAGNOSIS — I129 Hypertensive chronic kidney disease with stage 1 through stage 4 chronic kidney disease, or unspecified chronic kidney disease: Secondary | ICD-10-CM | POA: Diagnosis not present

## 2021-06-24 DIAGNOSIS — M810 Age-related osteoporosis without current pathological fracture: Secondary | ICD-10-CM | POA: Diagnosis not present

## 2021-06-24 DIAGNOSIS — Z8673 Personal history of transient ischemic attack (TIA), and cerebral infarction without residual deficits: Secondary | ICD-10-CM | POA: Diagnosis not present

## 2021-06-24 DIAGNOSIS — I5181 Takotsubo syndrome: Secondary | ICD-10-CM | POA: Diagnosis not present

## 2021-06-24 DIAGNOSIS — Z9181 History of falling: Secondary | ICD-10-CM | POA: Diagnosis not present

## 2021-06-24 DIAGNOSIS — M19041 Primary osteoarthritis, right hand: Secondary | ICD-10-CM | POA: Diagnosis not present

## 2021-06-24 DIAGNOSIS — Z7982 Long term (current) use of aspirin: Secondary | ICD-10-CM | POA: Diagnosis not present

## 2021-06-24 DIAGNOSIS — E11311 Type 2 diabetes mellitus with unspecified diabetic retinopathy with macular edema: Secondary | ICD-10-CM | POA: Diagnosis not present

## 2021-06-24 DIAGNOSIS — J449 Chronic obstructive pulmonary disease, unspecified: Secondary | ICD-10-CM | POA: Diagnosis not present

## 2021-06-24 DIAGNOSIS — M19042 Primary osteoarthritis, left hand: Secondary | ICD-10-CM | POA: Diagnosis not present

## 2021-06-24 DIAGNOSIS — Z794 Long term (current) use of insulin: Secondary | ICD-10-CM | POA: Diagnosis not present

## 2021-06-24 DIAGNOSIS — D631 Anemia in chronic kidney disease: Secondary | ICD-10-CM | POA: Diagnosis not present

## 2021-06-24 NOTE — Telephone Encounter (Signed)
Signed and put in box to go up front. Signed:  Crissie Sickles, MD           06/24/2021

## 2021-06-24 NOTE — Telephone Encounter (Signed)
Received faxed orders from [place]. Placed in Dr. Idelle Leech front office inbox to be signed and returned.

## 2021-06-24 NOTE — Telephone Encounter (Signed)
Home health orders received 06/23/21 for [place] health initiation orders: Yes.  Home health re-certification orders: No. Patient last seen by ordering physician for this condition: 06/03/21. Must be less than 90 days for re-certification and less than 30 days prior for initiation. Visit must have been for the condition the orders are being placed.  Patient meets criteria for Physician to sign orders: Yes.        Current med list has been attached: Yes        Orders placed on physicians desk for signature: 06/24/21 (date) If patient does not meet criteria for orders to be signed: pt was called to schedule appt. Appt is scheduled for 07/20/21.   Emilee Hero

## 2021-06-25 ENCOUNTER — Other Ambulatory Visit: Payer: Self-pay | Admitting: Family Medicine

## 2021-06-29 DIAGNOSIS — Z8673 Personal history of transient ischemic attack (TIA), and cerebral infarction without residual deficits: Secondary | ICD-10-CM | POA: Diagnosis not present

## 2021-06-29 DIAGNOSIS — Z794 Long term (current) use of insulin: Secondary | ICD-10-CM | POA: Diagnosis not present

## 2021-06-29 DIAGNOSIS — N183 Chronic kidney disease, stage 3 unspecified: Secondary | ICD-10-CM | POA: Diagnosis not present

## 2021-06-29 DIAGNOSIS — Z7951 Long term (current) use of inhaled steroids: Secondary | ICD-10-CM | POA: Diagnosis not present

## 2021-06-29 DIAGNOSIS — Z7982 Long term (current) use of aspirin: Secondary | ICD-10-CM | POA: Diagnosis not present

## 2021-06-29 DIAGNOSIS — D631 Anemia in chronic kidney disease: Secondary | ICD-10-CM | POA: Diagnosis not present

## 2021-06-29 DIAGNOSIS — I447 Left bundle-branch block, unspecified: Secondary | ICD-10-CM | POA: Diagnosis not present

## 2021-06-29 DIAGNOSIS — E7800 Pure hypercholesterolemia, unspecified: Secondary | ICD-10-CM | POA: Diagnosis not present

## 2021-06-29 DIAGNOSIS — M19041 Primary osteoarthritis, right hand: Secondary | ICD-10-CM | POA: Diagnosis not present

## 2021-06-29 DIAGNOSIS — H25013 Cortical age-related cataract, bilateral: Secondary | ICD-10-CM | POA: Diagnosis not present

## 2021-06-29 DIAGNOSIS — M810 Age-related osteoporosis without current pathological fracture: Secondary | ICD-10-CM | POA: Diagnosis not present

## 2021-06-29 DIAGNOSIS — M19042 Primary osteoarthritis, left hand: Secondary | ICD-10-CM | POA: Diagnosis not present

## 2021-06-29 DIAGNOSIS — Z9181 History of falling: Secondary | ICD-10-CM | POA: Diagnosis not present

## 2021-06-29 DIAGNOSIS — E11311 Type 2 diabetes mellitus with unspecified diabetic retinopathy with macular edema: Secondary | ICD-10-CM | POA: Diagnosis not present

## 2021-06-29 DIAGNOSIS — F32A Depression, unspecified: Secondary | ICD-10-CM | POA: Diagnosis not present

## 2021-06-29 DIAGNOSIS — J449 Chronic obstructive pulmonary disease, unspecified: Secondary | ICD-10-CM | POA: Diagnosis not present

## 2021-06-29 DIAGNOSIS — I5181 Takotsubo syndrome: Secondary | ICD-10-CM | POA: Diagnosis not present

## 2021-06-29 DIAGNOSIS — E1136 Type 2 diabetes mellitus with diabetic cataract: Secondary | ICD-10-CM | POA: Diagnosis not present

## 2021-06-29 DIAGNOSIS — E1122 Type 2 diabetes mellitus with diabetic chronic kidney disease: Secondary | ICD-10-CM | POA: Diagnosis not present

## 2021-06-29 DIAGNOSIS — I129 Hypertensive chronic kidney disease with stage 1 through stage 4 chronic kidney disease, or unspecified chronic kidney disease: Secondary | ICD-10-CM | POA: Diagnosis not present

## 2021-06-29 DIAGNOSIS — I2510 Atherosclerotic heart disease of native coronary artery without angina pectoris: Secondary | ICD-10-CM | POA: Diagnosis not present

## 2021-06-29 DIAGNOSIS — K219 Gastro-esophageal reflux disease without esophagitis: Secondary | ICD-10-CM | POA: Diagnosis not present

## 2021-07-01 DIAGNOSIS — F32A Depression, unspecified: Secondary | ICD-10-CM | POA: Diagnosis not present

## 2021-07-01 DIAGNOSIS — J449 Chronic obstructive pulmonary disease, unspecified: Secondary | ICD-10-CM | POA: Diagnosis not present

## 2021-07-01 DIAGNOSIS — M19041 Primary osteoarthritis, right hand: Secondary | ICD-10-CM | POA: Diagnosis not present

## 2021-07-01 DIAGNOSIS — I447 Left bundle-branch block, unspecified: Secondary | ICD-10-CM | POA: Diagnosis not present

## 2021-07-01 DIAGNOSIS — E11311 Type 2 diabetes mellitus with unspecified diabetic retinopathy with macular edema: Secondary | ICD-10-CM | POA: Diagnosis not present

## 2021-07-01 DIAGNOSIS — M19042 Primary osteoarthritis, left hand: Secondary | ICD-10-CM | POA: Diagnosis not present

## 2021-07-01 DIAGNOSIS — E7800 Pure hypercholesterolemia, unspecified: Secondary | ICD-10-CM | POA: Diagnosis not present

## 2021-07-01 DIAGNOSIS — I129 Hypertensive chronic kidney disease with stage 1 through stage 4 chronic kidney disease, or unspecified chronic kidney disease: Secondary | ICD-10-CM | POA: Diagnosis not present

## 2021-07-01 DIAGNOSIS — I5181 Takotsubo syndrome: Secondary | ICD-10-CM | POA: Diagnosis not present

## 2021-07-01 DIAGNOSIS — E1122 Type 2 diabetes mellitus with diabetic chronic kidney disease: Secondary | ICD-10-CM | POA: Diagnosis not present

## 2021-07-01 DIAGNOSIS — N183 Chronic kidney disease, stage 3 unspecified: Secondary | ICD-10-CM | POA: Diagnosis not present

## 2021-07-01 DIAGNOSIS — Z7951 Long term (current) use of inhaled steroids: Secondary | ICD-10-CM | POA: Diagnosis not present

## 2021-07-01 DIAGNOSIS — Z794 Long term (current) use of insulin: Secondary | ICD-10-CM | POA: Diagnosis not present

## 2021-07-01 DIAGNOSIS — M810 Age-related osteoporosis without current pathological fracture: Secondary | ICD-10-CM | POA: Diagnosis not present

## 2021-07-01 DIAGNOSIS — K219 Gastro-esophageal reflux disease without esophagitis: Secondary | ICD-10-CM | POA: Diagnosis not present

## 2021-07-01 DIAGNOSIS — Z8673 Personal history of transient ischemic attack (TIA), and cerebral infarction without residual deficits: Secondary | ICD-10-CM | POA: Diagnosis not present

## 2021-07-01 DIAGNOSIS — Z7982 Long term (current) use of aspirin: Secondary | ICD-10-CM | POA: Diagnosis not present

## 2021-07-01 DIAGNOSIS — D631 Anemia in chronic kidney disease: Secondary | ICD-10-CM | POA: Diagnosis not present

## 2021-07-01 DIAGNOSIS — E1136 Type 2 diabetes mellitus with diabetic cataract: Secondary | ICD-10-CM | POA: Diagnosis not present

## 2021-07-01 DIAGNOSIS — Z9181 History of falling: Secondary | ICD-10-CM | POA: Diagnosis not present

## 2021-07-01 DIAGNOSIS — H25013 Cortical age-related cataract, bilateral: Secondary | ICD-10-CM | POA: Diagnosis not present

## 2021-07-01 DIAGNOSIS — I2510 Atherosclerotic heart disease of native coronary artery without angina pectoris: Secondary | ICD-10-CM | POA: Diagnosis not present

## 2021-07-08 DIAGNOSIS — J449 Chronic obstructive pulmonary disease, unspecified: Secondary | ICD-10-CM | POA: Diagnosis not present

## 2021-07-08 DIAGNOSIS — M19041 Primary osteoarthritis, right hand: Secondary | ICD-10-CM | POA: Diagnosis not present

## 2021-07-08 DIAGNOSIS — E1136 Type 2 diabetes mellitus with diabetic cataract: Secondary | ICD-10-CM | POA: Diagnosis not present

## 2021-07-08 DIAGNOSIS — N183 Chronic kidney disease, stage 3 unspecified: Secondary | ICD-10-CM | POA: Diagnosis not present

## 2021-07-08 DIAGNOSIS — Z7982 Long term (current) use of aspirin: Secondary | ICD-10-CM | POA: Diagnosis not present

## 2021-07-08 DIAGNOSIS — I5181 Takotsubo syndrome: Secondary | ICD-10-CM | POA: Diagnosis not present

## 2021-07-08 DIAGNOSIS — I129 Hypertensive chronic kidney disease with stage 1 through stage 4 chronic kidney disease, or unspecified chronic kidney disease: Secondary | ICD-10-CM | POA: Diagnosis not present

## 2021-07-08 DIAGNOSIS — D631 Anemia in chronic kidney disease: Secondary | ICD-10-CM | POA: Diagnosis not present

## 2021-07-08 DIAGNOSIS — H25013 Cortical age-related cataract, bilateral: Secondary | ICD-10-CM | POA: Diagnosis not present

## 2021-07-08 DIAGNOSIS — M810 Age-related osteoporosis without current pathological fracture: Secondary | ICD-10-CM | POA: Diagnosis not present

## 2021-07-08 DIAGNOSIS — F32A Depression, unspecified: Secondary | ICD-10-CM | POA: Diagnosis not present

## 2021-07-08 DIAGNOSIS — E1122 Type 2 diabetes mellitus with diabetic chronic kidney disease: Secondary | ICD-10-CM | POA: Diagnosis not present

## 2021-07-08 DIAGNOSIS — M19042 Primary osteoarthritis, left hand: Secondary | ICD-10-CM | POA: Diagnosis not present

## 2021-07-08 DIAGNOSIS — Z794 Long term (current) use of insulin: Secondary | ICD-10-CM | POA: Diagnosis not present

## 2021-07-08 DIAGNOSIS — Z9181 History of falling: Secondary | ICD-10-CM | POA: Diagnosis not present

## 2021-07-08 DIAGNOSIS — I2510 Atherosclerotic heart disease of native coronary artery without angina pectoris: Secondary | ICD-10-CM | POA: Diagnosis not present

## 2021-07-08 DIAGNOSIS — K219 Gastro-esophageal reflux disease without esophagitis: Secondary | ICD-10-CM | POA: Diagnosis not present

## 2021-07-08 DIAGNOSIS — Z8673 Personal history of transient ischemic attack (TIA), and cerebral infarction without residual deficits: Secondary | ICD-10-CM | POA: Diagnosis not present

## 2021-07-08 DIAGNOSIS — I447 Left bundle-branch block, unspecified: Secondary | ICD-10-CM | POA: Diagnosis not present

## 2021-07-08 DIAGNOSIS — E11311 Type 2 diabetes mellitus with unspecified diabetic retinopathy with macular edema: Secondary | ICD-10-CM | POA: Diagnosis not present

## 2021-07-08 DIAGNOSIS — Z7951 Long term (current) use of inhaled steroids: Secondary | ICD-10-CM | POA: Diagnosis not present

## 2021-07-08 DIAGNOSIS — E7800 Pure hypercholesterolemia, unspecified: Secondary | ICD-10-CM | POA: Diagnosis not present

## 2021-07-09 ENCOUNTER — Other Ambulatory Visit: Payer: Self-pay | Admitting: *Deleted

## 2021-07-09 NOTE — Patient Outreach (Signed)
[facility]) Care Management  07/09/2021  [name] Jan 08, 1946 [id number]   Outgoing call placed to member's daughter, no answer, HIPAA compliant voice message left.  Will follow up within the next 3-4 business days.  [name], [place], MSN [place] [phone]

## 2021-07-14 ENCOUNTER — Other Ambulatory Visit: Payer: Self-pay | Admitting: *Deleted

## 2021-07-14 DIAGNOSIS — K219 Gastro-esophageal reflux disease without esophagitis: Secondary | ICD-10-CM | POA: Diagnosis not present

## 2021-07-14 DIAGNOSIS — E1136 Type 2 diabetes mellitus with diabetic cataract: Secondary | ICD-10-CM | POA: Diagnosis not present

## 2021-07-14 DIAGNOSIS — Z794 Long term (current) use of insulin: Secondary | ICD-10-CM | POA: Diagnosis not present

## 2021-07-14 DIAGNOSIS — J449 Chronic obstructive pulmonary disease, unspecified: Secondary | ICD-10-CM | POA: Diagnosis not present

## 2021-07-14 DIAGNOSIS — E1122 Type 2 diabetes mellitus with diabetic chronic kidney disease: Secondary | ICD-10-CM | POA: Diagnosis not present

## 2021-07-14 DIAGNOSIS — Z7982 Long term (current) use of aspirin: Secondary | ICD-10-CM | POA: Diagnosis not present

## 2021-07-14 DIAGNOSIS — I2510 Atherosclerotic heart disease of native coronary artery without angina pectoris: Secondary | ICD-10-CM | POA: Diagnosis not present

## 2021-07-14 DIAGNOSIS — D631 Anemia in chronic kidney disease: Secondary | ICD-10-CM | POA: Diagnosis not present

## 2021-07-14 DIAGNOSIS — E11311 Type 2 diabetes mellitus with unspecified diabetic retinopathy with macular edema: Secondary | ICD-10-CM | POA: Diagnosis not present

## 2021-07-14 DIAGNOSIS — M19041 Primary osteoarthritis, right hand: Secondary | ICD-10-CM | POA: Diagnosis not present

## 2021-07-14 DIAGNOSIS — M810 Age-related osteoporosis without current pathological fracture: Secondary | ICD-10-CM | POA: Diagnosis not present

## 2021-07-14 DIAGNOSIS — I129 Hypertensive chronic kidney disease with stage 1 through stage 4 chronic kidney disease, or unspecified chronic kidney disease: Secondary | ICD-10-CM | POA: Diagnosis not present

## 2021-07-14 DIAGNOSIS — H25013 Cortical age-related cataract, bilateral: Secondary | ICD-10-CM | POA: Diagnosis not present

## 2021-07-14 DIAGNOSIS — I5181 Takotsubo syndrome: Secondary | ICD-10-CM | POA: Diagnosis not present

## 2021-07-14 DIAGNOSIS — Z8673 Personal history of transient ischemic attack (TIA), and cerebral infarction without residual deficits: Secondary | ICD-10-CM | POA: Diagnosis not present

## 2021-07-14 DIAGNOSIS — E7800 Pure hypercholesterolemia, unspecified: Secondary | ICD-10-CM | POA: Diagnosis not present

## 2021-07-14 DIAGNOSIS — I447 Left bundle-branch block, unspecified: Secondary | ICD-10-CM | POA: Diagnosis not present

## 2021-07-14 DIAGNOSIS — Z9181 History of falling: Secondary | ICD-10-CM | POA: Diagnosis not present

## 2021-07-14 DIAGNOSIS — Z7951 Long term (current) use of inhaled steroids: Secondary | ICD-10-CM | POA: Diagnosis not present

## 2021-07-14 DIAGNOSIS — M19042 Primary osteoarthritis, left hand: Secondary | ICD-10-CM | POA: Diagnosis not present

## 2021-07-14 DIAGNOSIS — N183 Chronic kidney disease, stage 3 unspecified: Secondary | ICD-10-CM | POA: Diagnosis not present

## 2021-07-14 DIAGNOSIS — F32A Depression, unspecified: Secondary | ICD-10-CM | POA: Diagnosis not present

## 2021-07-14 NOTE — Patient Outreach (Signed)
[facility]) Care Management  07/14/2021  [name] [birth year] [id number]   Outreach attempt #2, unsuccessful, unable to leave voice message.  Will send outreach letter and follow up within the next 3-4 days.  [name], [place], MSN [place] [phone]

## 2021-07-19 ENCOUNTER — Other Ambulatory Visit: Payer: Self-pay | Admitting: *Deleted

## 2021-07-19 NOTE — Patient Outreach (Signed)
[facility]) Care Management  [place]  07/19/2021   [name] [birth year] [id number]   Outreach attempt #3, successful to daughter.  State she does not have much time to talk but report member is doing "much better."  Denies any urgent concerns, encouraged to contact this care manager with questions.    Encounter Medications:  Outpatient Encounter Medications as of 07/19/2021  Medication Sig   albuterol (PROVENTIL) (2.5 MG/3ML) 0.083% nebulizer solution USE 1 VIAL IN NEBULIZER EVERY 4 HOURS AS NEEDED FOR WHEEZING AND FOR SHORTNESS OF BREATH   albuterol (VENTOLIN HFA) 108 (90 Base) MCG/ACT inhaler INHALE 2 PUFFS BY MOUTH EVERY 6 HOURS AS NEEDED FOR WHEEZING OR SHORTNESS OF BREATH   aspirin 81 MG EC tablet Take 81 mg by mouth daily.    atorvastatin (LIPITOR) 40 MG tablet Take 1 tablet by mouth once daily   buPROPion (WELLBUTRIN XL) 150 MG 24 hr tablet Take 1 tablet by mouth once daily   cephALEXin (KEFLEX) 500 MG capsule Take 1 capsule (500 mg total) by mouth 4 (four) times daily.   cetirizine (ZYRTEC) 10 MG tablet Take 10 mg by mouth daily.   escitalopram (LEXAPRO) 20 MG tablet Take 1 tablet by mouth once daily   fluticasone (FLOVENT HFA) 220 MCG/ACT inhaler Inhale 2 puffs into the lungs 2 (two) times daily.   ibandronate (BONIVA) 150 MG tablet Take 1 tablet (150 mg total) by mouth every 30 (thirty) days. Take in the morning with a full glass of water, on an empty stomach, and do not take anything else by mouth or lie down for the next 30 min.   insulin isophane & regular human (HUMULIN 70/30 KWIKPEN) (70-30) 100 UNIT/ML KwikPen INJECT 4 UNITS SUBCUTANEOUSLY WITH BREAKFAST AND 2 UNITS WITH SUPPER   Lancets (ONETOUCH DELICA PLUS [id number]) MISC USE 1  TO CHECK GLUCOSE THREE TIMES DAILY   metoprolol tartrate (LOPRESSOR) 25 MG tablet Take 1/2 (one-half) tablet by mouth twice daily   Multiple Vitamins-Minerals (MULTIVITAMIN WOMEN PO) Take by mouth daily.    ONETOUCH ULTRA test strip USE 1 STRIP TO CHECK GLUCOSE THREE TIMES DAILY   pantoprazole (PROTONIX) 40 MG tablet Take 1 tablet by mouth once daily   No facility-administered encounter medications on file as of 07/19/2021.    Functional Status:  In your present state of health, do you have any difficulty performing the following activities: 12/16/2020  Hearing? N  Vision? N  Difficulty concentrating or making decisions? 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 Y  Dressing or bathing? N  Doing errands, shopping? Y  Preparing Food and eating ? Y  Using the Toilet? N  In the past six months, have you accidently leaked urine? Y  Comment wears depends  Do you have problems with loss of bowel control? N  Managing your Medications? Y  Managing your Finances? Y  Housekeeping or managing your Housekeeping? Y  Some recent data might be hidden    Fall/Depression Screening: Fall Risk  04/19/2021 03/23/2021 12/16/2020  Falls in the past year? 1 0 1  Comment - - -  Number falls in past yr: 0 0 0  Comment - - -  Injury with Fall? 1 0 0  Risk Factor Category  - - -  Risk for fall due to : History of fall(s);Impaired balance/gait Impaired balance/gait -  Risk for fall due to: Comment - - -  Follow up Falls evaluation  completed - Falls prevention discussed  Comment - - -   PHQ 2/9 Scores 03/23/2021 12/16/2020 08/29/2019 08/09/2019 04/19/2019 12/14/2018 12/13/2018  PHQ - 2 Score - 0 1 3 3 1 3   PHQ- 9 Score - - 9 9 8  - 19  Exception Documentation Other- indicate reason in comment box - - - - - -    Assessment:   Care Plan Care Plan : Wellness (Adult)  Updates made by [name], RN since 07/19/2021 12:00 AM     Problem: Cognitive Function (Wellness)      Long-Range Goal: Cognitive Function Enhanced Completed 07/19/2021  Start Date: 03/23/2021  Expected End Date: 06/23/2021  Recent Progress: On track  Priority: Medium  Note:    Evidence-based guidance:  Assess changes in cognitive function using standardized assessment when available.  Encourage and refer for services that stimulate thinking, problem-solving and memory, such as cognitive training and cognitive rehabilitation.  Encourage physical activity or exercise based on ability and tolerance.  Encourage enjoyable activities that provide general stimulation for thinking, concentration and memory in a social setting or small group.   Notes:     Task: Identify and Optimize Mental Processes Completed 07/19/2021  Due Date: 06/23/2021  Note:   Care Management Activities:    - consistent daily routine encouraged - extra time for response allowed    Notes:       Goals Addressed             This Visit's Progress    [facility] - Make and Keep All Appointments       Timeframe:  Short-Term Goal Priority:  Medium Start Date:          5/31                   Expected End Date:       9/31 (goal extended)              Barriers: Knowledge   - call to cancel if needed - keep a calendar with appointment dates    Why is this important?   Part of staying healthy is seeing the doctor for follow-up care.  If you forget your appointments, there are some things you can do to stay on track.    Notes:   5/31- Reviewed appointments with daughter, 6/3 with eye doctor and 6/27 with PCP  6/13 - Reminded daughter of PCP appointment scheduled for 6/27.    7/12 - Confirmed with daughter member attended follow up appointment with PCP, next appointment in September.  State  she discussed concern for member's slow mental decline, per notes this is to be expected.  No medication changes at this time, daughter verbalizes understanding on disease process.  8/12 - Was seen by PCP yesterday as follow up from UTI, remains on antibiotics.  Educated on signs/symptoms of infection that may not present as typical infection (confusion versus normal fever/burning with urination).  Will  follow up with PCP on 9/27  9/26 - Confirms follow up appointment with PCP tomorrow, denies any questions or need for transportation     COMPLETED: [place] and Injury   On track    Follow Up Date 6/13  Timeframe:  Long-Range Goal Priority:  High Start Date:     5/31                        Expected End Date:        8/31  Barriers: Other - decreased strength    - make an emergency alert plan in case I fall - use a cane or walker - use a nightlight in the bathroom    Why is this important?   Most falls happen when it is hard for you to walk safely. Your balance may be off because of an illness. You may have pain in your knees, hip or other joints.  You may be overly tired or taking medicines that make you sleepy. You may not be able to see or hear clearly.  Falls can lead to broken bones, bruises or other injuries.  There are things you can do to help prevent falling.     Notes:   5/31 - Call placed to PCP office to request orders for new walker and home health PT  6/13 - Confirmed member has new walker, call placed to PCP office to request referral for home health PT  7/12 - Still has not started PT.  Noted per chart, referral was sent [facility], they were unable to take due to staffing.  Call placed to PCP office to inquire about process.  Notified it was sent to [facility].  Call placed to [facility], they have not received referral.  Call placed back to PCP, advised of need to re-send referral, provided fax numbers.  8/12 - Confirmed with daughter that home health PT has started through [facility].  She had 3 visits last week, visits will resume next week.  9/26 - Daughter report member has done very well with PT sessions, remain involved.  Has not had any falls        Plan:  Follow-up: Patient agrees to Care Plan and Follow-up. Follow-up in 1 month(s).  [name], RN, MSN, [place] Care  Manager [phone]

## 2021-07-20 ENCOUNTER — Ambulatory Visit
Admission: RE | Admit: 2021-07-20 | Discharge: 2021-07-20 | Disposition: A | Discharge status: Home / Self Care | Payer: Medicare Other | Source: Ambulatory Visit | Attending: Family Medicine | Admitting: Family Medicine

## 2021-07-20 ENCOUNTER — Other Ambulatory Visit: Payer: Self-pay

## 2021-07-20 ENCOUNTER — Encounter: Payer: Self-pay | Admitting: Family Medicine

## 2021-07-20 ENCOUNTER — Ambulatory Visit (INDEPENDENT_AMBULATORY_CARE_PROVIDER_SITE_OTHER): Payer: Medicare Other | Admitting: Family Medicine

## 2021-07-20 VITALS — BP 105/67 | HR 86 | Temp 98.2°F | Ht 62.0 in | Wt 139.2 lb

## 2021-07-20 DIAGNOSIS — Z794 Long term (current) use of insulin: Secondary | ICD-10-CM | POA: Diagnosis not present

## 2021-07-20 DIAGNOSIS — E11319 Type 2 diabetes mellitus with unspecified diabetic retinopathy without macular edema: Secondary | ICD-10-CM | POA: Diagnosis not present

## 2021-07-20 DIAGNOSIS — N183 Chronic kidney disease, stage 3 unspecified: Secondary | ICD-10-CM

## 2021-07-20 DIAGNOSIS — E7800 Pure hypercholesterolemia, unspecified: Secondary | ICD-10-CM

## 2021-07-20 DIAGNOSIS — R0689 Other abnormalities of breathing: Secondary | ICD-10-CM | POA: Diagnosis not present

## 2021-07-20 DIAGNOSIS — I10 Essential (primary) hypertension: Secondary | ICD-10-CM

## 2021-07-20 DIAGNOSIS — J441 Chronic obstructive pulmonary disease with (acute) exacerbation: Secondary | ICD-10-CM | POA: Diagnosis not present

## 2021-07-20 DIAGNOSIS — J984 Other disorders of lung: Secondary | ICD-10-CM | POA: Diagnosis not present

## 2021-07-20 DIAGNOSIS — Z85118 Personal history of other malignant neoplasm of bronchus and lung: Secondary | ICD-10-CM | POA: Diagnosis not present

## 2021-07-20 DIAGNOSIS — R059 Cough, unspecified: Secondary | ICD-10-CM

## 2021-07-20 DIAGNOSIS — R911 Solitary pulmonary nodule: Secondary | ICD-10-CM | POA: Diagnosis not present

## 2021-07-20 DIAGNOSIS — F0390 Unspecified dementia without behavioral disturbance: Secondary | ICD-10-CM

## 2021-07-20 DIAGNOSIS — R0989 Other specified symptoms and signs involving the circulatory and respiratory systems: Secondary | ICD-10-CM

## 2021-07-20 DIAGNOSIS — Z23 Encounter for immunization: Secondary | ICD-10-CM | POA: Diagnosis not present

## 2021-07-20 DIAGNOSIS — I700 Atherosclerosis of aorta: Secondary | ICD-10-CM | POA: Diagnosis not present

## 2021-07-20 MED ORDER — PREDNISONE 20 MG PO TABS: ORAL_TABLET | ORAL | 0 refills | Status: DC

## 2021-07-20 MED ORDER — DOXYCYCLINE HYCLATE 100 MG PO CAPS: 100.0000 mg | ORAL_CAPSULE | Freq: Two times a day (BID) | ORAL | 0 refills | Status: AC

## 2021-07-20 NOTE — Progress Notes (Signed)
OFFICE VISIT  07/20/2021  CC:  Chief Complaint  Patient presents with   Follow-up    RCI; pt is not fasting    HPI:    Patient is a 75 y.o. Caucasian female who presents accompanied by her grandson [place] for 3 mo f/u DM, HTN, HLD, CRI III. A/P as of last visit: "1) DM: well controlled on low doses of 70/30 insulin bid. Too early for rpt a1c so she'll return for lab appt in 10-14d to get this.   2) HTN: well controlled. Cont 1/2 83m lopressor bid. Lytes/cr 10-14d with other labs.   3) HLD: olerating atorva 422mqd. LDL 71 three mo ago. Plan recheck lipids 6 mo.   4) CRI III: avoids nsaids. Follow lytes/cr in 10-14 with other labs.   5) COPD: stable.  Cont flovent HFA daily and albuterol q4h prn.   6) Dementia: she's gradually progressing with memory loss and having personality changes that are particularly bothersome to her helpful and stressed out caregivers (daughter and grandson). Empathetic listening today but nothing else to do at this time.  I don't think changing her wellbutrin or lexapro is going to help anything."  INTERIM HX: Requring albut daily for a while now, coughing more, has some increased effort of breathing intermittently lately, responds consistently to albuterol. No f/c/malaise  Appetite not too good but sounds chronic.  DM: fasting avg 110-120, 2H PP usually <150, no hypo. 4 U 70/30 qAM and 2 U qPM. No burning or numbness in feet.  No home bp monitoring. Compliant with atorva 40 qd.   Past Medical History:  Diagnosis Date   Age-related nuclear cataract of both eyes 2016   +cortical age related cataracts OU    Anxiety and depression    Bilateral diabetic retinopathy ([facility]   Dr. 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   COPD (chronic obstructive  pulmonary disease) (HCC)    CVA (cerebral vascular accident) ([place]12/2017   MRI did show a left-sided small ischemic stroke which was acute but likely incidental (MRI was done b/c pt had TIA sx's in hospital).  Neuro put her on plavix at that time.  Cardiology d/c'd her plavix 08/2017.   Diabetes mellitus with complication (HCC)    diab retinopathy OU (laser)   Finger fracture, right 04/2020   R 5th metacarpal fx at the base (Dr. LaMardelle Matte  GERD (gastroesophageal reflux disease)    protonix   History of concussion 08/25/2018   w/out loss of consciousness--mild increase in baseline memory impairment after.  CT head neg acute.   History of diverticulitis of colon    with abscess; required IR percutaneous drain placed 09/2016.   History of iron deficiency anemia    started iron 07/02/19.  EGD/colonoscopy 08/27/19 unrevealing. Capsule endoscopy considered but never done.   Hyperlipidemia    Hypertension    Left bundle branch block (LBBB) 12/16/2016   Lung cancer ([place]   non-small cell lung ca, stage III in 05/2011; systemic chemotherapy concurrent with radiation followed by prophylactic cranial irradiation and has been observation since July of 2010 with no evidence for disease recurrence-released from onc f/u 12/2014 (needs annual cxr by PCP).  CXR 08/2015 stable.  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 MoEarlie Serverid bx and this was NEG for  malignancy.   Mild cognitive impairment with memory loss    Likely from brain radiation therapy   Non-obstructive CAD (coronary artery disease)    a. Cath 2006 preserved LV fxn, scattered irregularities without critical stenosis; b. 2008 stress echo negative for ischemia, but with hypertensive response; c. 08/2016 Cath: D1 25%, otw nl.   Normocytic anemia 2018   Iron, vit B12, folate all normal 12/2016.   Open-angle glaucoma 2016   Dr. Shirley Muscat, (bilateral)---responding to topical therapy   Osteoarthritis of both hands    Osteoporosis 03/2016; 03/2018   03/2016  DEXA T-score -2.2.  03/2018 T-score L femoral neck  -2.7--boniva started. DEXA 04/2021 T scor -2.6->boniva continued. Plan rpt DEXA 2 yrs.   Pneumonia    hospitalization 11/2016; hypoxemic resp failure--d/c'd home with home oxygen therapy   Seasonal allergic rhinitis    Subclinical hyperthyroidism 2018   Takotsubo cardiomyopathy    a. 08/2016 Echo: EF 30-35%, gr1DD, PASP 12mHg;  b. 08/2016 Cath: nonobs Dzs;  c. 11/2016 Echo: EF 40-45%, no rwma, nl RV fxn, mild TR, PASP 380mg.  ECHO 02/2017 EF 50-55%, grd II DD---essentially resolved Takotsubo 02/2017.   Torsades de pointes ([place]11/2017   a. 08/2016 in setting of diverticulitis & pneumoperitoneum and Takotsubo CM -->prolonged QT, seen by EP-->avoid meds with potential for QT prolongation.    Past Surgical History:  Procedure Laterality Date   ABDOMINAL HYSTERECTOMY  1997   APPENDECTOMY     CARDIAC CATHETERIZATION N/A 09/14/2016   Procedure: Left Heart Cath and Coronary Angiography;  Surgeon: ThTroy SineMD;  [facility];  Service: Cardiovascular; Mild nonobstructive CAD with 85% smooth narrowing in the first diagonal branch of the LAD; 10% smooth narrowing of the ostial proximal left circumflex coronary artery; and a normal dominant RCA.   CARPAL TUNNEL RELEASE     CATARACT EXTRACTION Bilateral    CHOLECYSTECTOMY  2002   COLONOSCOPY  10/24/00; 08/27/19   2002 normal.  [employer] hemoccult testing via [facility] 06/18/15 was NEG.  2020->diverticulosis o/w nl.   dexa  03/2016; 03/2018   03/2018 T-score L femoral neck  -2.7 (worsened compared to osteopenic range 2017.) DEXA 05/03/21 stable T score -2.6 on boniva x 2 yrs->plan rpt DEXA 2 yrs.   ESOPHAGOGASTRODUODENOSCOPY  08/27/2019   Normal.   IR GENERIC HISTORICAL  09/19/2016   IR SINUS/FIST TUBE CHK-NON GI 09/19/2016 JaCorrie MckusickDO [facility]   IR GENERIC HISTORICAL  10/06/2016   IR RADIOLOGIST EVAL & MGMT 10/06/2016 [facility]   IR GENERIC HISTORICAL  10/26/2016   IR  RADIOLOGIST EVAL & MGMT 10/26/2016 [facility]   IR GENERIC HISTORICAL  11/03/2016   IR RADIOLOGIST EVAL & MGMT 11/03/2016 WeArdis RowanPA-C [facility]   IR GENERIC HISTORICAL  11/24/2016   IR RADIOLOGIST EVAL & MGMT 11/24/2016 WeArdis RowanPA-C [facility]   IR GENERIC HISTORICAL  12/05/2016   Fistula smaller/improving.  IR SINUS/FIST TUBE CHK-NON GI 12/05/2016 JoSandi MariscalMD [facility]   IR GENERIC HISTORICAL  12/22/2016   IR RADIOLOGIST EVAL & MGMT 12/22/2016 MiGreggory KeenMD [facility]   IR RADIOLOGIST EVAL & MGMT  01/10/2017   IR RADIOLOGIST EVAL & MGMT  02/09/2017   IR SINUS/FIST TUBE CHK-NON GI  03/28/2017   LEFT HEART CATHETERIZATION WITH CORONARY ANGIOGRAM N/A 05/30/2012   Procedure: LEFT HEART CATHETERIZATION WITH CORONARY ANGIOGRAM;  Surgeon: ChBurnell BlanksMD;  [facility];  Service: Cardiovascular:  Mild, non-obstructive CAD   TRANSTHORACIC ECHOCARDIOGRAM  09/14/2016; 11/2016   08/2016: EF 30-35 %, Akinesis of the mid-apicalanteroseptal myocardium.  Grade I DD.  Mild pulm HTN.  Repeat echo 11/2016, EF 40-45%, no rwma, nl RV fxn, mild TR, PASP 55mHg.     TRANSTHORACIC ECHOCARDIOGRAM  03/07/2017   EF 50-55%. Hypokinesis of the distal septum with overall low normal LV,  systolic function; mild diastolic dysfunction with elevated LV  filling pressure; mildly calcified aortic valve with mild AI; small pericardial effusion    Outpatient Medications Prior to Visit  Medication Sig Dispense Refill   albuterol (PROVENTIL) (2.5 MG/3ML) 0.083% nebulizer solution USE 1 VIAL IN NEBULIZER EVERY 4 HOURS AS NEEDED FOR WHEEZING AND FOR SHORTNESS OF BREATH 90 mL 1   albuterol (VENTOLIN HFA) 108 (90 Base) MCG/ACT inhaler INHALE 2 PUFFS BY MOUTH EVERY 6 HOURS AS NEEDED FOR WHEEZING OR SHORTNESS OF BREATH 18 g 1   aspirin 81 MG EC tablet Take 81 mg by mouth daily.      atorvastatin (LIPITOR) 40 MG tablet Take 1 tablet by mouth once daily 90  tablet 0   buPROPion (WELLBUTRIN XL) 150 MG 24 hr tablet Take 1 tablet by mouth once daily 90 tablet 0   cetirizine (ZYRTEC) 10 MG tablet Take 10 mg by mouth daily.     escitalopram (LEXAPRO) 20 MG tablet Take 1 tablet by mouth once daily 90 tablet 0   fluticasone (FLOVENT HFA) 220 MCG/ACT inhaler Inhale 2 puffs into the lungs 2 (two) times daily. 12 g 11   ibandronate (BONIVA) 150 MG tablet Take 1 tablet (150 mg total) by mouth every 30 (thirty) days. Take in the morning with a full glass of water, on an empty stomach, and do not take anything else by mouth or lie down for the next 30 min. 3 tablet 3   insulin isophane & regular human (HUMULIN 70/30 KWIKPEN) (70-30) 100 UNIT/ML KwikPen INJECT 4 UNITS SUBCUTANEOUSLY WITH BREAKFAST AND 2 UNITS WITH SUPPER 15 mL 1   Lancets (ONETOUCH DELICA PLUS [id number] MISC USE 1  TO CHECK GLUCOSE THREE TIMES DAILY 100 each 5   metoprolol tartrate (LOPRESSOR) 25 MG tablet Take 1/2 (one-half) tablet by mouth twice daily 90 tablet 0   Multiple Vitamins-Minerals (MULTIVITAMIN WOMEN PO) Take by mouth daily.     ONETOUCH ULTRA test strip USE 1 STRIP TO CHECK GLUCOSE THREE TIMES DAILY 100 each 11   pantoprazole (PROTONIX) 40 MG tablet Take 1 tablet by mouth once daily 90 tablet 0   cephALEXin (KEFLEX) 500 MG capsule Take 1 capsule (500 mg total) by mouth 4 (four) times daily. (Patient not taking: Reported on 07/20/2021) 28 capsule 0   No facility-administered medications prior to visit.    Allergies  Allergen Reactions   Lactose Intolerance (Gi) Diarrhea   Ibuprofen Other (See Comments)    REACTION: Anxiousness, hyperventilates    ROS As per HPI  PE: Vitals with BMI 07/20/2021 06/03/2021 05/28/2021  Height 5' 2"  5' 2"  -  Weight 139 lbs 3 oz 138 lbs 10 oz -  BMI 237.16296.78-  Systolic [id number] Diastolic 67 65 39  Pulse 86 87 81   Gen: Alert, well appearing.  Patient is oriented to person, place, time, and situation. AFFECT: pleasant, lucid thought and  speech. CV: RRR, no m/r/g.   LUNGS: CTA bilat, nonlabored resps, good aeration in all lung fields.  Bronchial breath sounds R upper posterior lung field EXT: no clubbing or  cyanosis.  no edema.  Foot exam - no swelling, tenderness or skin or vascular lesions. Color and temperature is normal. Sensation is intact. Peripheral pulses are palpable. Toenails are normal.  LABS:  Lab Results  Component Value Date   TSH 1.24 04/20/2017   Lab Results  Component Value Date   WBC 6.9 05/28/2021   HGB 11.8 (L) 05/28/2021   HCT 35.3 (L) 05/28/2021   MCV 92.4 05/28/2021   PLT 200 05/28/2021   Lab Results  Component Value Date   IRON 44 10/11/2019   TIBC 236 (L) 10/11/2019   FERRITIN 86 10/11/2019    Lab Results  Component Value Date   CREATININE 0.90 05/28/2021   BUN 16 05/28/2021   NA 134 (L) 05/28/2021   K 4.3 05/28/2021   CL 103 05/28/2021   CO2 25 05/28/2021   Lab Results  Component Value Date   ALT 12 04/29/2021   AST 16 04/29/2021   ALKPHOS 76 04/29/2021   BILITOT 0.4 04/29/2021   Lab Results  Component Value Date   CHOL 130 10/28/2020   Lab Results  Component Value Date   HDL 43.00 10/28/2020   Lab Results  Component Value Date   LDLCALC 71 10/28/2020   Lab Results  Component Value Date   TRIG 79.0 10/28/2020   Lab Results  Component Value Date   CHOLHDL 3 10/28/2020   Lab Results  Component Value Date   HGBA1C 6.4 04/29/2021    IMPRESSION AND PLAN:  1) DM 2, good control. Cont 70/30 4U qAM and 2 U qPM.  2) HTN: good control on 12.22m lopressor bid.  3) HLD: tolerating atorva 40 qd. Next lipid panel 3 mo.  4) Dementia: stable, some personality changes but no behav disturbance.  5) COPD exacerbation, question of pneumonia (bronchial BS R upper lung field). Doxy 100 bid x 7d, prednisone 40 qd x 5d, then 20 qd x 5d. CXR today.  6) CRI III: avoids nsaids. Hydration has been adequate in the past.  ? Down some lately.  She will be due for labs  at f/u in 10d (Hba1c and cmet).  An After Visit Summary was printed and given to the patient.  FOLLOW UP: Return for 10 d f/u cough/copd, also non-fasting labs.  Signed:  PCrissie Sickles MD           07/20/2021

## 2021-07-20 NOTE — Addendum Note (Signed)
Addended by: Deveron Furlong D on: 07/20/2021 11:50 AM   Modules accepted: Orders

## 2021-07-21 ENCOUNTER — Telehealth: Payer: Self-pay

## 2021-07-21 DIAGNOSIS — Z7951 Long term (current) use of inhaled steroids: Secondary | ICD-10-CM | POA: Diagnosis not present

## 2021-07-21 DIAGNOSIS — K219 Gastro-esophageal reflux disease without esophagitis: Secondary | ICD-10-CM | POA: Diagnosis not present

## 2021-07-21 DIAGNOSIS — M810 Age-related osteoporosis without current pathological fracture: Secondary | ICD-10-CM | POA: Diagnosis not present

## 2021-07-21 DIAGNOSIS — I2510 Atherosclerotic heart disease of native coronary artery without angina pectoris: Secondary | ICD-10-CM | POA: Diagnosis not present

## 2021-07-21 DIAGNOSIS — D631 Anemia in chronic kidney disease: Secondary | ICD-10-CM | POA: Diagnosis not present

## 2021-07-21 DIAGNOSIS — Z7982 Long term (current) use of aspirin: Secondary | ICD-10-CM | POA: Diagnosis not present

## 2021-07-21 DIAGNOSIS — N183 Chronic kidney disease, stage 3 unspecified: Secondary | ICD-10-CM | POA: Diagnosis not present

## 2021-07-21 DIAGNOSIS — M19041 Primary osteoarthritis, right hand: Secondary | ICD-10-CM | POA: Diagnosis not present

## 2021-07-21 DIAGNOSIS — I5181 Takotsubo syndrome: Secondary | ICD-10-CM | POA: Diagnosis not present

## 2021-07-21 DIAGNOSIS — E11311 Type 2 diabetes mellitus with unspecified diabetic retinopathy with macular edema: Secondary | ICD-10-CM | POA: Diagnosis not present

## 2021-07-21 DIAGNOSIS — E7800 Pure hypercholesterolemia, unspecified: Secondary | ICD-10-CM | POA: Diagnosis not present

## 2021-07-21 DIAGNOSIS — Z9181 History of falling: Secondary | ICD-10-CM | POA: Diagnosis not present

## 2021-07-21 DIAGNOSIS — H25013 Cortical age-related cataract, bilateral: Secondary | ICD-10-CM | POA: Diagnosis not present

## 2021-07-21 DIAGNOSIS — Z8673 Personal history of transient ischemic attack (TIA), and cerebral infarction without residual deficits: Secondary | ICD-10-CM | POA: Diagnosis not present

## 2021-07-21 DIAGNOSIS — E1122 Type 2 diabetes mellitus with diabetic chronic kidney disease: Secondary | ICD-10-CM | POA: Diagnosis not present

## 2021-07-21 DIAGNOSIS — I447 Left bundle-branch block, unspecified: Secondary | ICD-10-CM | POA: Diagnosis not present

## 2021-07-21 DIAGNOSIS — Z794 Long term (current) use of insulin: Secondary | ICD-10-CM | POA: Diagnosis not present

## 2021-07-21 DIAGNOSIS — I129 Hypertensive chronic kidney disease with stage 1 through stage 4 chronic kidney disease, or unspecified chronic kidney disease: Secondary | ICD-10-CM | POA: Diagnosis not present

## 2021-07-21 DIAGNOSIS — J449 Chronic obstructive pulmonary disease, unspecified: Secondary | ICD-10-CM | POA: Diagnosis not present

## 2021-07-21 DIAGNOSIS — M19042 Primary osteoarthritis, left hand: Secondary | ICD-10-CM | POA: Diagnosis not present

## 2021-07-21 DIAGNOSIS — E1136 Type 2 diabetes mellitus with diabetic cataract: Secondary | ICD-10-CM | POA: Diagnosis not present

## 2021-07-21 DIAGNOSIS — F32A Depression, unspecified: Secondary | ICD-10-CM | POA: Diagnosis not present

## 2021-07-21 NOTE — Telephone Encounter (Signed)
Received critical results from [facility] via phone and fax. Notified PCP and gave fax to PCP.

## 2021-07-21 NOTE — Telephone Encounter (Signed)
Ok for orders? 

## 2021-07-21 NOTE — Telephone Encounter (Signed)
.  Home Health verbal orders [name]: advanced home health  Callback number: [phone]  Requesting OT/PT/Skilled nursing/Social Work/Speech:PT   Reason:Extension   Frequency:1x a week for 4 more weeks   Please forward to [facility] pool or providers CMA

## 2021-07-21 NOTE — Telephone Encounter (Signed)
Yes okay 

## 2021-07-21 NOTE — Telephone Encounter (Signed)
LM for secure VM regarding VO approval.

## 2021-07-27 ENCOUNTER — Telehealth: Payer: Self-pay

## 2021-07-27 NOTE — Telephone Encounter (Signed)
Placed on PCP desk to review and sign, if appropriate.   Home health orders received 07/27/21 for [place] health initiation orders: Yes.  Home health re-certification orders: No. Patient last seen by ordering physician for this condition: 07/20/21. Must be less than 90 days for re-certification and less than 30 days prior for initiation. Visit must have been for the condition the orders are being placed.  Patient meets criteria for Physician to sign orders: Yes.        Current med list has been attached: Yes        Orders placed on physicians desk for signature: 07/27/21 (date) If patient does not meet criteria for orders to be signed: pt was called to schedule appt. Appt is scheduled for 08/02/21.   Emilee Hero

## 2021-07-28 DIAGNOSIS — M19041 Primary osteoarthritis, right hand: Secondary | ICD-10-CM | POA: Diagnosis not present

## 2021-07-28 DIAGNOSIS — E1122 Type 2 diabetes mellitus with diabetic chronic kidney disease: Secondary | ICD-10-CM | POA: Diagnosis not present

## 2021-07-28 DIAGNOSIS — M810 Age-related osteoporosis without current pathological fracture: Secondary | ICD-10-CM | POA: Diagnosis not present

## 2021-07-28 DIAGNOSIS — I129 Hypertensive chronic kidney disease with stage 1 through stage 4 chronic kidney disease, or unspecified chronic kidney disease: Secondary | ICD-10-CM | POA: Diagnosis not present

## 2021-07-28 DIAGNOSIS — Z9181 History of falling: Secondary | ICD-10-CM | POA: Diagnosis not present

## 2021-07-28 DIAGNOSIS — I447 Left bundle-branch block, unspecified: Secondary | ICD-10-CM | POA: Diagnosis not present

## 2021-07-28 DIAGNOSIS — M19042 Primary osteoarthritis, left hand: Secondary | ICD-10-CM | POA: Diagnosis not present

## 2021-07-28 DIAGNOSIS — I2510 Atherosclerotic heart disease of native coronary artery without angina pectoris: Secondary | ICD-10-CM | POA: Diagnosis not present

## 2021-07-28 DIAGNOSIS — D631 Anemia in chronic kidney disease: Secondary | ICD-10-CM | POA: Diagnosis not present

## 2021-07-28 DIAGNOSIS — E7800 Pure hypercholesterolemia, unspecified: Secondary | ICD-10-CM | POA: Diagnosis not present

## 2021-07-28 DIAGNOSIS — Z7951 Long term (current) use of inhaled steroids: Secondary | ICD-10-CM | POA: Diagnosis not present

## 2021-07-28 DIAGNOSIS — Z7982 Long term (current) use of aspirin: Secondary | ICD-10-CM | POA: Diagnosis not present

## 2021-07-28 DIAGNOSIS — J449 Chronic obstructive pulmonary disease, unspecified: Secondary | ICD-10-CM | POA: Diagnosis not present

## 2021-07-28 DIAGNOSIS — F32A Depression, unspecified: Secondary | ICD-10-CM | POA: Diagnosis not present

## 2021-07-28 DIAGNOSIS — Z794 Long term (current) use of insulin: Secondary | ICD-10-CM | POA: Diagnosis not present

## 2021-07-28 DIAGNOSIS — E1136 Type 2 diabetes mellitus with diabetic cataract: Secondary | ICD-10-CM | POA: Diagnosis not present

## 2021-07-28 DIAGNOSIS — H25013 Cortical age-related cataract, bilateral: Secondary | ICD-10-CM | POA: Diagnosis not present

## 2021-07-28 DIAGNOSIS — E11311 Type 2 diabetes mellitus with unspecified diabetic retinopathy with macular edema: Secondary | ICD-10-CM | POA: Diagnosis not present

## 2021-07-28 DIAGNOSIS — K219 Gastro-esophageal reflux disease without esophagitis: Secondary | ICD-10-CM | POA: Diagnosis not present

## 2021-07-28 DIAGNOSIS — N183 Chronic kidney disease, stage 3 unspecified: Secondary | ICD-10-CM | POA: Diagnosis not present

## 2021-07-28 NOTE — Telephone Encounter (Signed)
Signed and put in box to go up front. Signed:  Crissie Sickles, MD           07/28/2021

## 2021-08-02 ENCOUNTER — Encounter: Payer: Self-pay | Admitting: Family Medicine

## 2021-08-02 ENCOUNTER — Ambulatory Visit (INDEPENDENT_AMBULATORY_CARE_PROVIDER_SITE_OTHER): Payer: Medicare Other | Admitting: Family Medicine

## 2021-08-02 ENCOUNTER — Other Ambulatory Visit: Payer: Self-pay

## 2021-08-02 VITALS — BP 98/63 | HR 78 | Temp 97.7°F | Ht 62.0 in | Wt 136.0 lb

## 2021-08-02 DIAGNOSIS — Z794 Long term (current) use of insulin: Secondary | ICD-10-CM

## 2021-08-02 DIAGNOSIS — I10 Essential (primary) hypertension: Secondary | ICD-10-CM

## 2021-08-02 DIAGNOSIS — E7800 Pure hypercholesterolemia, unspecified: Secondary | ICD-10-CM | POA: Diagnosis not present

## 2021-08-02 DIAGNOSIS — J441 Chronic obstructive pulmonary disease with (acute) exacerbation: Secondary | ICD-10-CM | POA: Diagnosis not present

## 2021-08-02 DIAGNOSIS — R918 Other nonspecific abnormal finding of lung field: Secondary | ICD-10-CM | POA: Diagnosis not present

## 2021-08-02 DIAGNOSIS — E11319 Type 2 diabetes mellitus with unspecified diabetic retinopathy without macular edema: Secondary | ICD-10-CM

## 2021-08-02 DIAGNOSIS — N183 Chronic kidney disease, stage 3 unspecified: Secondary | ICD-10-CM | POA: Diagnosis not present

## 2021-08-02 DIAGNOSIS — R911 Solitary pulmonary nodule: Secondary | ICD-10-CM

## 2021-08-02 NOTE — Addendum Note (Signed)
Addended by: Deveron Furlong D on: 08/02/2021 02:51 PM   Modules accepted: Orders

## 2021-08-02 NOTE — Progress Notes (Signed)
OFFICE VISIT  08/02/2021  CC:  Chief Complaint  Patient presents with   COPD    F/u; pt is not fasting    HPI:    Patient is a 75 y.o. female who presents accompanied by her grandson for 13d f/u COPD exacerbation and discussion of recent abnormal CXR. A/P as of last visit: "1) DM 2, good control. Cont 70/30 4U qAM and 2 U qPM.   2) HTN: good control on 12.5mg lopressor bid.   3) HLD: tolerating atorva 40 qd. Next lipid panel 3 mo.   4) Dementia: stable, some personality changes but no behav disturbance.   5) COPD exacerbation, question of pneumonia (bronchial BS R upper lung field). Doxy 100 bid x 7d, prednisone 40 qd x 5d, then 20 qd x 5d. CXR today.   6) CRI III: avoids nsaids. Hydration has been adequate in the past.  ? Down some lately."  INTERIM HX: Says she is feeling good.  Denies any shortness of breath, chest tightness, or wheezing.  He has not needed to use albuterol at all lately. She has finished the prednisone and doxycycline. We reviewed the chest x-ray result from last visit.  CXR 07/20/21: IMPRESSION: 1. No radiographic evidence of acute cardiopulmonary disease. 2. However, there is a vague nodular density projecting over the left upper lobe near the apex. Further evaluation with nonemergent chest CT is strongly recommended in the near future to better evaluate this finding and exclude the possibility of neoplasm.   Past Medical History:  Diagnosis Date   Age-related nuclear cataract of both eyes 2016   +cortical age related cataracts OU    Anxiety and depression    Bilateral diabetic retinopathy (HCC) 2015   Dr. 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   COPD (chronic obstructive pulmonary disease) (HCC)    CVA (cerebral vascular accident) (HCC) 09/2016   MRI  did show a left-sided small ischemic stroke which was acute but likely incidental (MRI was done b/c pt had TIA sx's in hospital).  Neuro put her on plavix at that time.  Cardiology d/c'd her plavix 08/2017.   Diabetes mellitus with complication (HCC)    diab retinopathy OU (laser)   Finger fracture, right 04/2020   R 5th metacarpal fx at the base (Dr. Landau)   GERD (gastroesophageal reflux disease)    protonix   History of concussion 08/25/2018   w/out loss of consciousness--mild increase in baseline memory impairment after.  CT head neg acute.   History of diverticulitis of colon    with abscess; required IR percutaneous drain placed 09/2016.   History of iron deficiency anemia    started iron 07/02/19.  EGD/colonoscopy 08/27/19 unrevealing. Capsule endoscopy considered but never done.   Hyperlipidemia    Hypertension    Left bundle branch block (LBBB) 12/16/2016   Lung cancer (HCC)    non-small cell lung ca, stage III in 05/2011; systemic chemotherapy concurrent with radiation followed by prophylactic cranial irradiation and has been observation since July of 2010 with no evidence for disease recurrence-released from onc f/u 12/2014 (needs annual cxr by PCP).  CXR 08/2015 stable.  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 Mohammed did bx and this was NEG for malignancy.   Mild cognitive impairment with memory loss    Likely from brain   radiation therapy   Non-obstructive CAD (coronary artery disease)    a. Cath 2006 preserved LV fxn, scattered irregularities without critical stenosis; b. 2008 stress echo negative for ischemia, but with hypertensive response; c. 08/2016 Cath: D1 25%, otw nl.   Normocytic anemia 2018   Iron, vit B12, folate all normal 12/2016.   Open-angle glaucoma 2016   Dr. Shirley Muscat, (bilateral)---responding to topical therapy   Osteoarthritis of both hands    Osteoporosis 03/2016; 03/2018   03/2016 DEXA T-score -2.2.  03/2018 T-score L femoral neck  -2.7--boniva started. DEXA 04/2021  T scor -2.6->boniva continued. Plan rpt DEXA 2 yrs.   Pneumonia    hospitalization 11/2016; hypoxemic resp failure--d/c'd home with home oxygen therapy   Seasonal allergic rhinitis    Subclinical hyperthyroidism 2018   Takotsubo cardiomyopathy    a. 08/2016 Echo: EF 30-35%, gr1DD, PASP 66mHg;  b. 08/2016 Cath: nonobs Dzs;  c. 11/2016 Echo: EF 40-45%, no rwma, nl RV fxn, mild TR, PASP 349mg.  ECHO 02/2017 EF 50-55%, grd II DD---essentially resolved Takotsubo 02/2017.   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 08/2016 in setting of diverticulitis & pneumoperitoneum and Takotsubo CM -->prolonged QT, seen by EP-->avoid meds with potential for QT prolongation.    Past Surgical History:  Procedure Laterality Date   ABDOMINAL HYSTERECTOMY  1997   APPENDECTOMY     CARDIAC CATHETERIZATION N/A 09/14/2016   Procedure: Left Heart Cath and Coronary Angiography;  Surgeon: ThTroy SineMD;  [facility];  Service: Cardiovascular; Mild nonobstructive CAD with 85% smooth narrowing in the first diagonal branch of the LAD; 10% smooth narrowing of the ostial proximal left circumflex coronary artery; and a normal dominant RCA.   CARPAL TUNNEL RELEASE     CATARACT EXTRACTION Bilateral    CHOLECYSTECTOMY  2002   COLONOSCOPY  10/24/00; 08/27/19   2002 normal.  [employer] hemoccult testing via [facility] 06/18/15 was NEG.  2020->diverticulosis o/w nl.   dexa  03/2016; 03/2018   03/2018 T-score L femoral neck  -2.7 (worsened compared to osteopenic range 2017.) DEXA 05/03/21 stable T score -2.6 on boniva x 2 yrs->plan rpt DEXA 2 yrs.   ESOPHAGOGASTRODUODENOSCOPY  08/27/2019   Normal.   IR GENERIC HISTORICAL  09/19/2016   IR SINUS/FIST TUBE CHK-NON GI 09/19/2016 JaCorrie MckusickDO [facility]   IR GENERIC HISTORICAL  10/06/2016   IR RADIOLOGIST EVAL & MGMT 10/06/2016 [facility]   IR GENERIC HISTORICAL  10/26/2016   IR RADIOLOGIST EVAL & MGMT 10/26/2016 [facility]   IR GENERIC HISTORICAL  11/03/2016   IR  RADIOLOGIST EVAL & MGMT 11/03/2016 WeArdis RowanPA-C [facility]   IR GENERIC HISTORICAL  11/24/2016   IR RADIOLOGIST EVAL & MGMT 11/24/2016 WeArdis RowanPA-C [facility]   IR GENERIC HISTORICAL  12/05/2016   Fistula smaller/improving.  IR SINUS/FIST TUBE CHK-NON GI 12/05/2016 JoSandi MariscalMD [facility]   IR GENERIC HISTORICAL  12/22/2016   IR RADIOLOGIST EVAL & MGMT 12/22/2016 MiGreggory KeenMD [facility]   IR RADIOLOGIST EVAL & MGMT  01/10/2017   IR RADIOLOGIST EVAL & MGMT  02/09/2017   IR SINUS/FIST TUBE CHK-NON GI  03/28/2017   LEFT HEART CATHETERIZATION WITH CORONARY ANGIOGRAM N/A 05/30/2012   Procedure: LEFT HEART CATHETERIZATION WITH CORONARY ANGIOGRAM;  Surgeon: ChBurnell BlanksMD;  [facility];  Service: Cardiovascular: Mild, non-obstructive CAD   TRANSTHORACIC ECHOCARDIOGRAM  09/14/2016; 11/2016   08/2016: EF 30-35 %,  Akinesis of the mid-apicalanteroseptal myocardium.  Grade I DD.  Mild pulm HTN.  Repeat echo 11/2016, EF 40-45%, no rwma, nl RV fxn, mild TR, PASP 37mmHg.     TRANSTHORACIC ECHOCARDIOGRAM  03/07/2017   EF 50-55%. Hypokinesis of the distal septum with overall low normal LV,  systolic function; mild diastolic dysfunction with elevated LV  filling pressure; mildly calcified aortic valve with mild AI; small pericardial effusion    Outpatient Medications Prior to Visit  Medication Sig Dispense Refill   albuterol (PROVENTIL) (2.5 MG/3ML) 0.083% nebulizer solution USE 1 VIAL IN NEBULIZER EVERY 4 HOURS AS NEEDED FOR WHEEZING AND FOR SHORTNESS OF BREATH 90 mL 1   albuterol (VENTOLIN HFA) 108 (90 Base) MCG/ACT inhaler INHALE 2 PUFFS BY MOUTH EVERY 6 HOURS AS NEEDED FOR WHEEZING OR SHORTNESS OF BREATH 18 g 1   aspirin 81 MG EC tablet Take 81 mg by mouth daily.      atorvastatin (LIPITOR) 40 MG tablet Take 1 tablet by mouth once daily 90 tablet 0   buPROPion (WELLBUTRIN XL) 150 MG 24 hr tablet Take 1 tablet by mouth once daily 90  tablet 0   cetirizine (ZYRTEC) 10 MG tablet Take 10 mg by mouth daily.     escitalopram (LEXAPRO) 20 MG tablet Take 1 tablet by mouth once daily 90 tablet 0   fluticasone (FLOVENT HFA) 220 MCG/ACT inhaler Inhale 2 puffs into the lungs 2 (two) times daily. 12 g 11   ibandronate (BONIVA) 150 MG tablet Take 1 tablet (150 mg total) by mouth every 30 (thirty) days. Take in the morning with a full glass of water, on an empty stomach, and do not take anything else by mouth or lie down for the next 30 min. 3 tablet 3   insulin isophane & regular human (HUMULIN 70/30 KWIKPEN) (70-30) 100 UNIT/ML KwikPen INJECT 4 UNITS SUBCUTANEOUSLY WITH BREAKFAST AND 2 UNITS WITH SUPPER 15 mL 1   Lancets (ONETOUCH DELICA PLUS LANCET33G) MISC USE 1  TO CHECK GLUCOSE THREE TIMES DAILY 100 each 5   metoprolol tartrate (LOPRESSOR) 25 MG tablet Take 1/2 (one-half) tablet by mouth twice daily 90 tablet 0   Multiple Vitamins-Minerals (MULTIVITAMIN WOMEN PO) Take by mouth daily.     ONETOUCH ULTRA test strip USE 1 STRIP TO CHECK GLUCOSE THREE TIMES DAILY 100 each 11   pantoprazole (PROTONIX) 40 MG tablet Take 1 tablet by mouth once daily 90 tablet 0   predniSONE (DELTASONE) 20 MG tablet 2 tabs po qd x 5d, then 1 tab po qd x 5d 15 tablet 0   No facility-administered medications prior to visit.    Allergies  Allergen Reactions   Lactose Intolerance (Gi) Diarrhea   Ibuprofen Other (See Comments)    REACTION: Anxiousness, hyperventilates    ROS As per HPI  PE: Vitals with BMI 08/02/2021 07/20/2021 06/03/2021  Height 5' 2" 5' 2" 5' 2"  Weight 136 lbs 139 lbs 3 oz 138 lbs 10 oz  BMI 24.87 25.45 25.34  Systolic 98 105 104  Diastolic 63 67 65  Pulse 78 86 87  O2 sat on RA today is 95%   Gen: Alert, well appearing.  Patient is oriented to person, place, time, and situation. AFFECT: pleasant, lucid thought and speech. CV: RRR, no m/r/g.   LUNGS: CTA bilat, nonlabored resps, good aeration in all lung fields. EXT: no  clubbing or cyanosis.  no edema.   LABS:  Lab Results  Component Value Date   TSH 1.24 04/20/2017     Lab Results  Component Value Date   WBC 6.9 05/28/2021   HGB 11.8 (L) 05/28/2021   HCT 35.3 (L) 05/28/2021   MCV 92.4 05/28/2021   PLT 200 05/28/2021   Lab Results  Component Value Date   IRON 44 10/11/2019   TIBC 236 (L) 10/11/2019   FERRITIN 86 10/11/2019   Lab Results  Component Value Date   CREATININE 0.90 05/28/2021   BUN 16 05/28/2021   NA 134 (L) 05/28/2021   K 4.3 05/28/2021   CL 103 05/28/2021   CO2 25 05/28/2021   Lab Results  Component Value Date   ALT 12 04/29/2021   AST 16 04/29/2021   ALKPHOS 76 04/29/2021   BILITOT 0.4 04/29/2021   Lab Results  Component Value Date   CHOL 130 10/28/2020   Lab Results  Component Value Date   HDL 43.00 10/28/2020   Lab Results  Component Value Date   LDLCALC 71 10/28/2020   Lab Results  Component Value Date   TRIG 79.0 10/28/2020   Lab Results  Component Value Date   CHOLHDL 3 10/28/2020   Lab Results  Component Value Date   HGBA1C 6.4 04/29/2021   IMPRESSION AND PLAN:   1) COPD exacerbation.  Completely resolved status post antibiotics and steroids. Continue Flovent and as needed albuterol.  2) pulmonary nodule, left upper lobe.  This was noted on chest x-ray and it was recommended to get a follow-up CT.  She does have a history of lung cancer back in 2012--tx'd with chemo and radiation. Will do contrast-enhanced CT chest if GFR >60, otherwise have to do noncontrast CT.  Cmet and a1c today.  An After Visit Summary was printed and given to the patient.  FOLLOW UP: Return in about 3 months (around 11/02/2021) for routine chronic illness f/u.  Signed:  Crissie Sickles, MD           08/02/2021

## 2021-08-03 ENCOUNTER — Ambulatory Visit (INDEPENDENT_AMBULATORY_CARE_PROVIDER_SITE_OTHER): Payer: Medicare Other

## 2021-08-03 DIAGNOSIS — E7800 Pure hypercholesterolemia, unspecified: Secondary | ICD-10-CM | POA: Diagnosis not present

## 2021-08-03 DIAGNOSIS — Z794 Long term (current) use of insulin: Secondary | ICD-10-CM

## 2021-08-03 DIAGNOSIS — N183 Chronic kidney disease, stage 3 unspecified: Secondary | ICD-10-CM | POA: Diagnosis not present

## 2021-08-03 DIAGNOSIS — E11319 Type 2 diabetes mellitus with unspecified diabetic retinopathy without macular edema: Secondary | ICD-10-CM | POA: Diagnosis not present

## 2021-08-03 DIAGNOSIS — I10 Essential (primary) hypertension: Secondary | ICD-10-CM

## 2021-08-03 LAB — COMPREHENSIVE METABOLIC PANEL
ALT: 17 U/L (ref 0–35)
AST: 18 U/L (ref 0–37)
Albumin: 3.6 g/dL (ref 3.5–5.2)
Alkaline Phosphatase: 66 U/L (ref 39–117)
BUN: 17 mg/dL (ref 6–23)
CO2: 29 mEq/L (ref 19–32)
Calcium: 7.4 mg/dL — ABNORMAL LOW (ref 8.4–10.5)
Chloride: 102 mEq/L (ref 96–112)
Creatinine, Ser: 0.99 mg/dL (ref 0.40–1.20)
GFR: 56.72 mL/min — ABNORMAL LOW (ref >60.00)
Glucose, Bld: 236 mg/dL — ABNORMAL HIGH (ref 70–99)
Potassium: 4 mEq/L (ref 3.5–5.1)
Sodium: 138 mEq/L (ref 135–145)
Total Bilirubin: 0.3 mg/dL (ref 0.2–1.2)
Total Protein: 5.8 g/dL — ABNORMAL LOW (ref 6.0–8.3)

## 2021-08-03 LAB — HEMOGLOBIN A1C: Hgb A1c MFr Bld: 6.9 % — ABNORMAL HIGH (ref 4.6–6.5)

## 2021-08-06 DIAGNOSIS — E7800 Pure hypercholesterolemia, unspecified: Secondary | ICD-10-CM | POA: Diagnosis not present

## 2021-08-06 DIAGNOSIS — Z9181 History of falling: Secondary | ICD-10-CM | POA: Diagnosis not present

## 2021-08-06 DIAGNOSIS — M810 Age-related osteoporosis without current pathological fracture: Secondary | ICD-10-CM | POA: Diagnosis not present

## 2021-08-06 DIAGNOSIS — N183 Chronic kidney disease, stage 3 unspecified: Secondary | ICD-10-CM | POA: Diagnosis not present

## 2021-08-06 DIAGNOSIS — E1136 Type 2 diabetes mellitus with diabetic cataract: Secondary | ICD-10-CM | POA: Diagnosis not present

## 2021-08-06 DIAGNOSIS — H25013 Cortical age-related cataract, bilateral: Secondary | ICD-10-CM | POA: Diagnosis not present

## 2021-08-06 DIAGNOSIS — K219 Gastro-esophageal reflux disease without esophagitis: Secondary | ICD-10-CM | POA: Diagnosis not present

## 2021-08-06 DIAGNOSIS — Z7951 Long term (current) use of inhaled steroids: Secondary | ICD-10-CM | POA: Diagnosis not present

## 2021-08-06 DIAGNOSIS — E1122 Type 2 diabetes mellitus with diabetic chronic kidney disease: Secondary | ICD-10-CM | POA: Diagnosis not present

## 2021-08-06 DIAGNOSIS — E11311 Type 2 diabetes mellitus with unspecified diabetic retinopathy with macular edema: Secondary | ICD-10-CM | POA: Diagnosis not present

## 2021-08-06 DIAGNOSIS — D631 Anemia in chronic kidney disease: Secondary | ICD-10-CM | POA: Diagnosis not present

## 2021-08-06 DIAGNOSIS — I2510 Atherosclerotic heart disease of native coronary artery without angina pectoris: Secondary | ICD-10-CM | POA: Diagnosis not present

## 2021-08-06 DIAGNOSIS — M19041 Primary osteoarthritis, right hand: Secondary | ICD-10-CM | POA: Diagnosis not present

## 2021-08-06 DIAGNOSIS — I447 Left bundle-branch block, unspecified: Secondary | ICD-10-CM | POA: Diagnosis not present

## 2021-08-06 DIAGNOSIS — Z7982 Long term (current) use of aspirin: Secondary | ICD-10-CM | POA: Diagnosis not present

## 2021-08-06 DIAGNOSIS — I129 Hypertensive chronic kidney disease with stage 1 through stage 4 chronic kidney disease, or unspecified chronic kidney disease: Secondary | ICD-10-CM | POA: Diagnosis not present

## 2021-08-06 DIAGNOSIS — J449 Chronic obstructive pulmonary disease, unspecified: Secondary | ICD-10-CM | POA: Diagnosis not present

## 2021-08-06 DIAGNOSIS — F32A Depression, unspecified: Secondary | ICD-10-CM | POA: Diagnosis not present

## 2021-08-06 DIAGNOSIS — M19042 Primary osteoarthritis, left hand: Secondary | ICD-10-CM | POA: Diagnosis not present

## 2021-08-06 DIAGNOSIS — Z794 Long term (current) use of insulin: Secondary | ICD-10-CM | POA: Diagnosis not present

## 2021-08-11 DIAGNOSIS — E1136 Type 2 diabetes mellitus with diabetic cataract: Secondary | ICD-10-CM | POA: Diagnosis not present

## 2021-08-11 DIAGNOSIS — E7800 Pure hypercholesterolemia, unspecified: Secondary | ICD-10-CM | POA: Diagnosis not present

## 2021-08-11 DIAGNOSIS — F32A Depression, unspecified: Secondary | ICD-10-CM | POA: Diagnosis not present

## 2021-08-11 DIAGNOSIS — M19041 Primary osteoarthritis, right hand: Secondary | ICD-10-CM | POA: Diagnosis not present

## 2021-08-11 DIAGNOSIS — Z7982 Long term (current) use of aspirin: Secondary | ICD-10-CM | POA: Diagnosis not present

## 2021-08-11 DIAGNOSIS — E11311 Type 2 diabetes mellitus with unspecified diabetic retinopathy with macular edema: Secondary | ICD-10-CM | POA: Diagnosis not present

## 2021-08-11 DIAGNOSIS — Z9181 History of falling: Secondary | ICD-10-CM | POA: Diagnosis not present

## 2021-08-11 DIAGNOSIS — I2510 Atherosclerotic heart disease of native coronary artery without angina pectoris: Secondary | ICD-10-CM | POA: Diagnosis not present

## 2021-08-11 DIAGNOSIS — H25013 Cortical age-related cataract, bilateral: Secondary | ICD-10-CM | POA: Diagnosis not present

## 2021-08-11 DIAGNOSIS — E1122 Type 2 diabetes mellitus with diabetic chronic kidney disease: Secondary | ICD-10-CM | POA: Diagnosis not present

## 2021-08-11 DIAGNOSIS — I129 Hypertensive chronic kidney disease with stage 1 through stage 4 chronic kidney disease, or unspecified chronic kidney disease: Secondary | ICD-10-CM | POA: Diagnosis not present

## 2021-08-11 DIAGNOSIS — D631 Anemia in chronic kidney disease: Secondary | ICD-10-CM | POA: Diagnosis not present

## 2021-08-11 DIAGNOSIS — Z7951 Long term (current) use of inhaled steroids: Secondary | ICD-10-CM | POA: Diagnosis not present

## 2021-08-11 DIAGNOSIS — M19042 Primary osteoarthritis, left hand: Secondary | ICD-10-CM | POA: Diagnosis not present

## 2021-08-11 DIAGNOSIS — K219 Gastro-esophageal reflux disease without esophagitis: Secondary | ICD-10-CM | POA: Diagnosis not present

## 2021-08-11 DIAGNOSIS — J449 Chronic obstructive pulmonary disease, unspecified: Secondary | ICD-10-CM | POA: Diagnosis not present

## 2021-08-11 DIAGNOSIS — M810 Age-related osteoporosis without current pathological fracture: Secondary | ICD-10-CM | POA: Diagnosis not present

## 2021-08-11 DIAGNOSIS — N183 Chronic kidney disease, stage 3 unspecified: Secondary | ICD-10-CM | POA: Diagnosis not present

## 2021-08-11 DIAGNOSIS — I447 Left bundle-branch block, unspecified: Secondary | ICD-10-CM | POA: Diagnosis not present

## 2021-08-11 DIAGNOSIS — Z794 Long term (current) use of insulin: Secondary | ICD-10-CM | POA: Diagnosis not present

## 2021-08-12 ENCOUNTER — Telehealth: Payer: Self-pay

## 2021-08-12 NOTE — Telephone Encounter (Signed)
Home health orders received 08/11/21 for [facility] initiation orders: No.  Home health re-certification orders: Yes. Patient last seen by ordering physician for this condition: 08/02/21. Must be less than 90 days for re-certification and less than 30 days prior for initiation. Visit must have been for the condition the orders are being placed.  Patient meets criteria for Physician to sign orders: Yes.        Current med list has been attached: Yes        Orders placed on physicians desk for signature: 08/12/21 (date) If patient does not meet criteria for orders to be signed: pt was called to schedule appt. Appt is scheduled for N/A.   Emilee Hero

## 2021-08-12 NOTE — Telephone Encounter (Signed)
Signed and put in box to go up front. Signed:  Crissie Sickles, MD           08/12/2021

## 2021-08-16 ENCOUNTER — Ambulatory Visit: Payer: Self-pay | Admitting: *Deleted

## 2021-08-16 ENCOUNTER — Other Ambulatory Visit: Payer: Self-pay | Admitting: *Deleted

## 2021-08-16 NOTE — Patient Outreach (Addendum)
[facility]) Care Management  08/16/2021  [name] 12/10/1945 [id number]   Outgoing call placed to member's daughter, state this is not a good time to talk and will call this RNCM back.  Will await call back, if no call back will follow up within the next 3-4 business days.     Update:  Incoming call received back from daughter.  Denies any urgent concerns, encouraged to contact this care manager with questions.  Agrees to follow up within the next month.  Care Plan : RNCM Plan of Care  Updates made by [name], RN since 08/16/2021 12:00 AM     Problem: Chronic care management of multiple chronic conditions (CHF, DM, COPD)   Priority: High     Long-Range Goal: Ability for member and family to adequately manage chronic conditions (CHF, DM, COPD)   Start Date: 08/16/2021  Expected End Date: 02/12/2022  Priority: High  Note:   Current Barriers:  Knowledge Deficits related to plan of care for management of CHF, COPD, and DMII Chronic Disease Management support and education needs related to CHF, COPD, and DMII  RNCM Clinical Goal(s):  Patient will verbalize understanding of plan for management of CHF, COPD, and DMII take all medications exactly as prescribed and will call provider for medication related questions attend all scheduled medical appointments: PCP continue to work with RN Care Manager to address care management and care coordination needs related to  CHF, COPD, and DMII work with [facility]  to increase strength and mobility through PT.  Noted per chart, HH sessions have been approved for extension demonstrate a decrease in CHF, COPD, and DMII exacerbations    through collaboration with RN Care manager, provider, and care team.   Interventions: Inter-disciplinary care team collaboration (see longitudinal plan of care) Evaluation of current treatment plan related to  self management and patient's adherence to plan as established by  provider   Diabetes Interventions: Assessed patient's understanding of A1c goal: <6.5% Provided education to patient about basic DM disease process; Discussed plans with patient for ongoing care management follow up and provided patient with direct contact information for care management team; Advised patient, providing education and rationale, to check cbg daily and record, calling PCP for findings outside established parameters; Screening for signs and symptoms of depression related to chronic disease state;  Lab Results  Component Value Date   HGBA1C 6.9 (H) 08/03/2021   COPD Interventions:   Provided patient with basic written and verbal COPD education on self care/management/and exacerbation prevention; Advised patient to track and manage COPD triggers;  Advised patient to self assesses COPD action plan zone and make appointment with provider if in the yellow zone for 48 hours without improvement; Discussed the importance of adequate rest and management of fatigue with COPD; Screening for signs and symptoms of depression related to chronic disease state;  Advised daughter to call PCP office as long nodule was noted on most recent CXR, recommended to have CT scan, not scheduled yet.  She was provided antibiotics and steroids recently for pneumonia, per daughter, this has improved.  Heart Failure Interventions: Basic overview and discussion of pathophysiology of Heart Failure reviewed; Provided education on low sodium diet; Provided education about placing scale on hard, flat surface; Advised patient to weigh each morning after emptying bladder; Discussed the importance of keeping all appointments with provider;  Patient Goals/Self-Care Activities: Patient will self administer medications as prescribed Patient will attend all scheduled provider appointments Patient will continue to perform  ADL's independently  Follow Up Plan:  Telephone follow up appointment with care management  team member scheduled for:  11/21      Goals Addressed             This Visit's Progress    COMPLETED: [facility] - Make and Keep All Appointments       Timeframe:  Short-Term Goal Priority:  Medium Start Date:          5/31                   Expected End Date:       9/31 (goal extended)              Barriers: Knowledge   - call to cancel if needed - keep a calendar with appointment dates    Why is this important?   Part of staying healthy is seeing the doctor for follow-up care.  If you forget your appointments, there are some things you can do to stay on track.    Notes:   5/31- Reviewed appointments with daughter, 6/3 with eye doctor and 6/27 with PCP  6/13 - Reminded daughter of PCP appointment scheduled for 6/27.    7/12 - Confirmed with daughter member attended follow up appointment with PCP, next appointment in September.  State  she discussed concern for member's slow mental decline, per notes this is to be expected.  No medication changes at this time, daughter verbalizes understanding on disease process.  8/12 - Was seen by PCP yesterday as follow up from UTI, remains on antibiotics.  Educated on signs/symptoms of infection that may not present as typical infection (confusion versus normal fever/burning with urination).  Will follow up with PCP on 9/27  9/26 - Confirms follow up appointment with PCP tomorrow, denies any questions or need for transportation          [name], Therapist, sports, MSN [place] Manager [phone]

## 2021-08-17 DIAGNOSIS — F32A Depression, unspecified: Secondary | ICD-10-CM | POA: Diagnosis not present

## 2021-08-17 DIAGNOSIS — J449 Chronic obstructive pulmonary disease, unspecified: Secondary | ICD-10-CM | POA: Diagnosis not present

## 2021-08-17 DIAGNOSIS — I2510 Atherosclerotic heart disease of native coronary artery without angina pectoris: Secondary | ICD-10-CM | POA: Diagnosis not present

## 2021-08-17 DIAGNOSIS — E11311 Type 2 diabetes mellitus with unspecified diabetic retinopathy with macular edema: Secondary | ICD-10-CM | POA: Diagnosis not present

## 2021-08-17 DIAGNOSIS — Z7982 Long term (current) use of aspirin: Secondary | ICD-10-CM | POA: Diagnosis not present

## 2021-08-17 DIAGNOSIS — N183 Chronic kidney disease, stage 3 unspecified: Secondary | ICD-10-CM | POA: Diagnosis not present

## 2021-08-17 DIAGNOSIS — M19042 Primary osteoarthritis, left hand: Secondary | ICD-10-CM | POA: Diagnosis not present

## 2021-08-17 DIAGNOSIS — Z7951 Long term (current) use of inhaled steroids: Secondary | ICD-10-CM | POA: Diagnosis not present

## 2021-08-17 DIAGNOSIS — E1122 Type 2 diabetes mellitus with diabetic chronic kidney disease: Secondary | ICD-10-CM | POA: Diagnosis not present

## 2021-08-17 DIAGNOSIS — D631 Anemia in chronic kidney disease: Secondary | ICD-10-CM | POA: Diagnosis not present

## 2021-08-17 DIAGNOSIS — M19041 Primary osteoarthritis, right hand: Secondary | ICD-10-CM | POA: Diagnosis not present

## 2021-08-17 DIAGNOSIS — Z9181 History of falling: Secondary | ICD-10-CM | POA: Diagnosis not present

## 2021-08-17 DIAGNOSIS — I447 Left bundle-branch block, unspecified: Secondary | ICD-10-CM | POA: Diagnosis not present

## 2021-08-17 DIAGNOSIS — K219 Gastro-esophageal reflux disease without esophagitis: Secondary | ICD-10-CM | POA: Diagnosis not present

## 2021-08-17 DIAGNOSIS — E7800 Pure hypercholesterolemia, unspecified: Secondary | ICD-10-CM | POA: Diagnosis not present

## 2021-08-17 DIAGNOSIS — M810 Age-related osteoporosis without current pathological fracture: Secondary | ICD-10-CM | POA: Diagnosis not present

## 2021-08-17 DIAGNOSIS — H25013 Cortical age-related cataract, bilateral: Secondary | ICD-10-CM | POA: Diagnosis not present

## 2021-08-17 DIAGNOSIS — I129 Hypertensive chronic kidney disease with stage 1 through stage 4 chronic kidney disease, or unspecified chronic kidney disease: Secondary | ICD-10-CM | POA: Diagnosis not present

## 2021-08-17 DIAGNOSIS — Z794 Long term (current) use of insulin: Secondary | ICD-10-CM | POA: Diagnosis not present

## 2021-08-17 DIAGNOSIS — E1136 Type 2 diabetes mellitus with diabetic cataract: Secondary | ICD-10-CM | POA: Diagnosis not present

## 2021-08-23 ENCOUNTER — Telehealth: Payer: Self-pay | Admitting: Family Medicine

## 2021-08-23 DIAGNOSIS — Z85118 Personal history of other malignant neoplasm of bronchus and lung: Secondary | ICD-10-CM

## 2021-08-23 DIAGNOSIS — R918 Other nonspecific abnormal finding of lung field: Secondary | ICD-10-CM

## 2021-08-23 NOTE — Telephone Encounter (Signed)
That's my fault, tell them I apologize. I was waiting on her kidney function test result after her last visit but after it came back I forgot to put the order in. I have ordered it now.

## 2021-08-23 NOTE — Telephone Encounter (Signed)
Pt daughter called and said that Dr Anitra Lauth was supposed to order a CT scan for her a few weeks ago and that they hadnt heard anything. Please advise. Call back [phone]

## 2021-08-23 NOTE — Telephone Encounter (Signed)
Spoke with pt's daughter, [name] and imaging appt has been scheduled for 2 weeks from now. Nothing further needed.

## 2021-08-23 NOTE — Telephone Encounter (Signed)
Pt was last seen 10/10, mentioned in note: 2) pulmonary nodule, left upper lobe.  This was noted on chest x-ray and it was recommended to get a follow-up CT.  She does have a history of lung cancer back in 2012--tx'd with chemo and radiation. Will do contrast-enhanced CT chest if GFR >60, otherwise have to do noncontrast CT.    Please review and advise

## 2021-08-24 DIAGNOSIS — R911 Solitary pulmonary nodule: Secondary | ICD-10-CM

## 2021-08-24 HISTORY — DX: Solitary pulmonary nodule: R91.1

## 2021-08-31 IMAGING — DX DG FEMUR 2+V*L*
4 series · 4 of 4 positions shown · non-contrast
Comparison: None.

CLINICAL DATA: Left leg pain, history of remote injury several
months ago, initial encounter

EXAM:
LEFT FEMUR 2 VIEWS

[femur ap (1 of 2)]
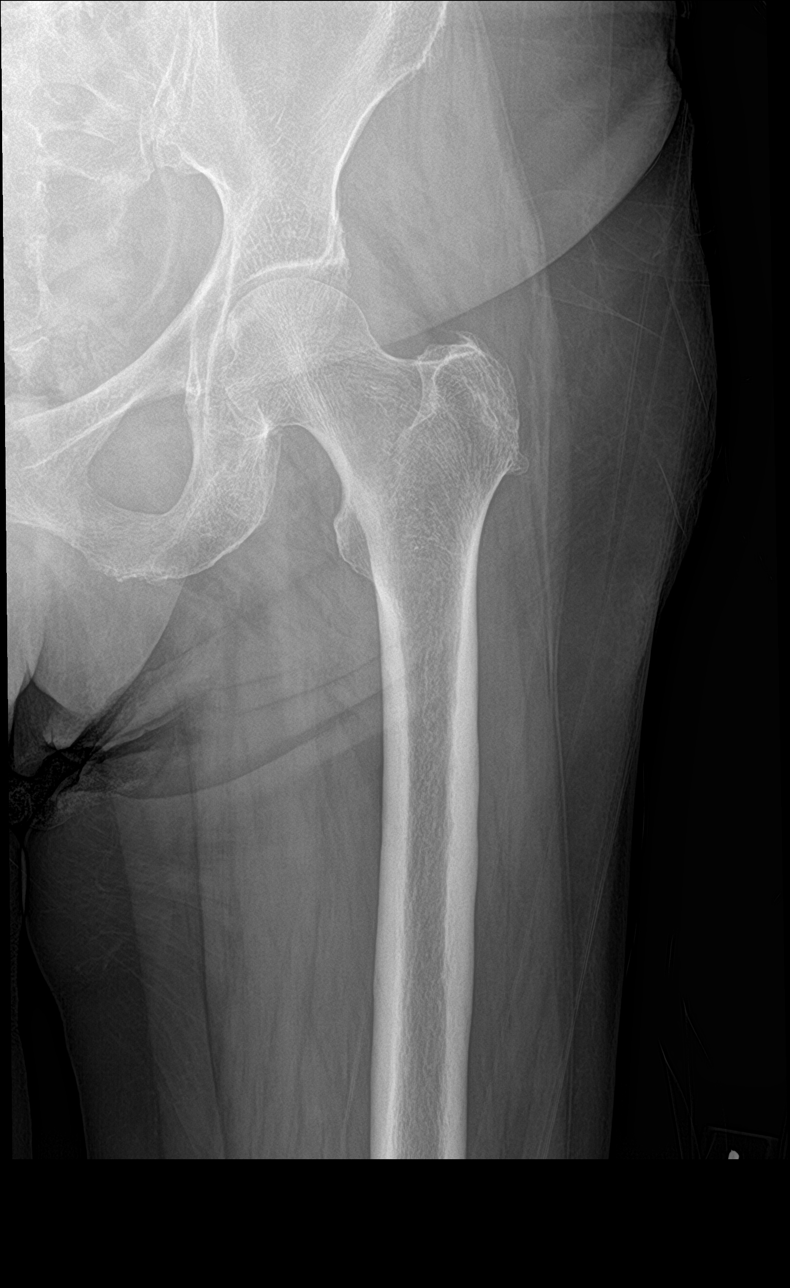

[femur ap (2 of 2)]
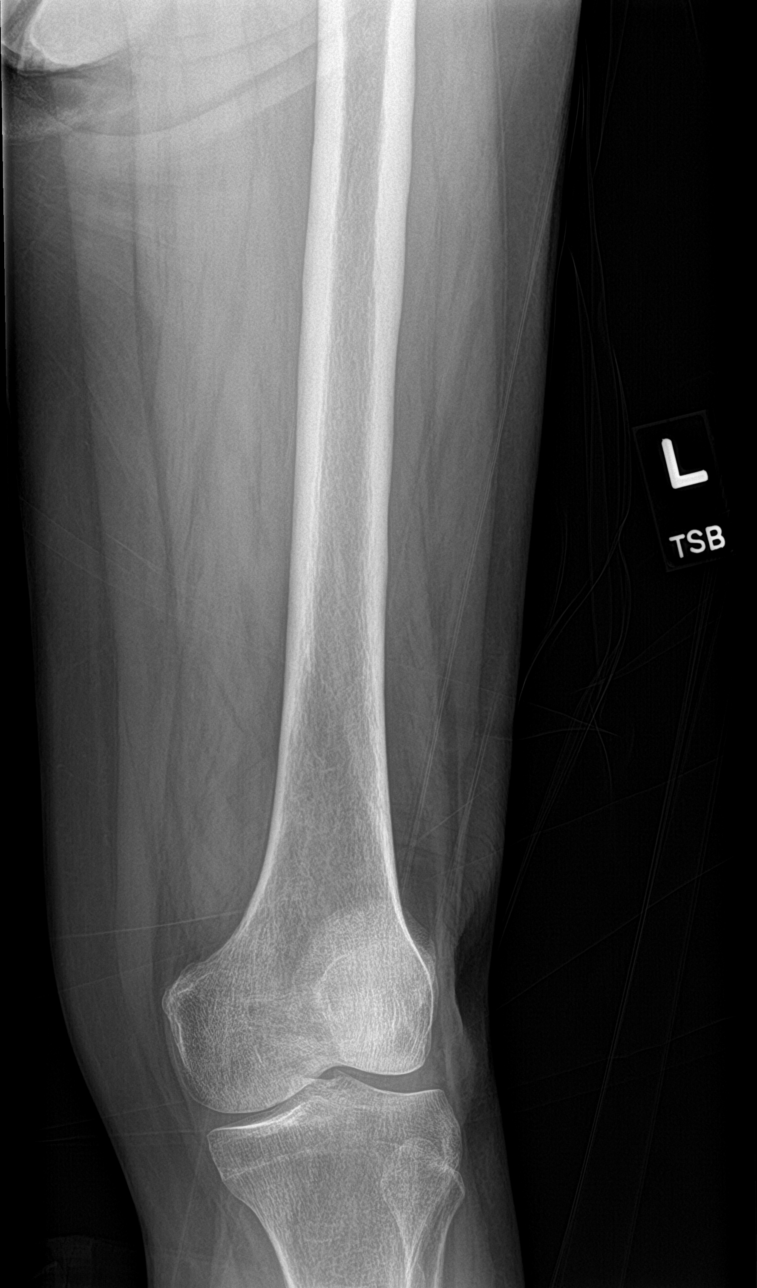

[femur lat (1 of 2)]
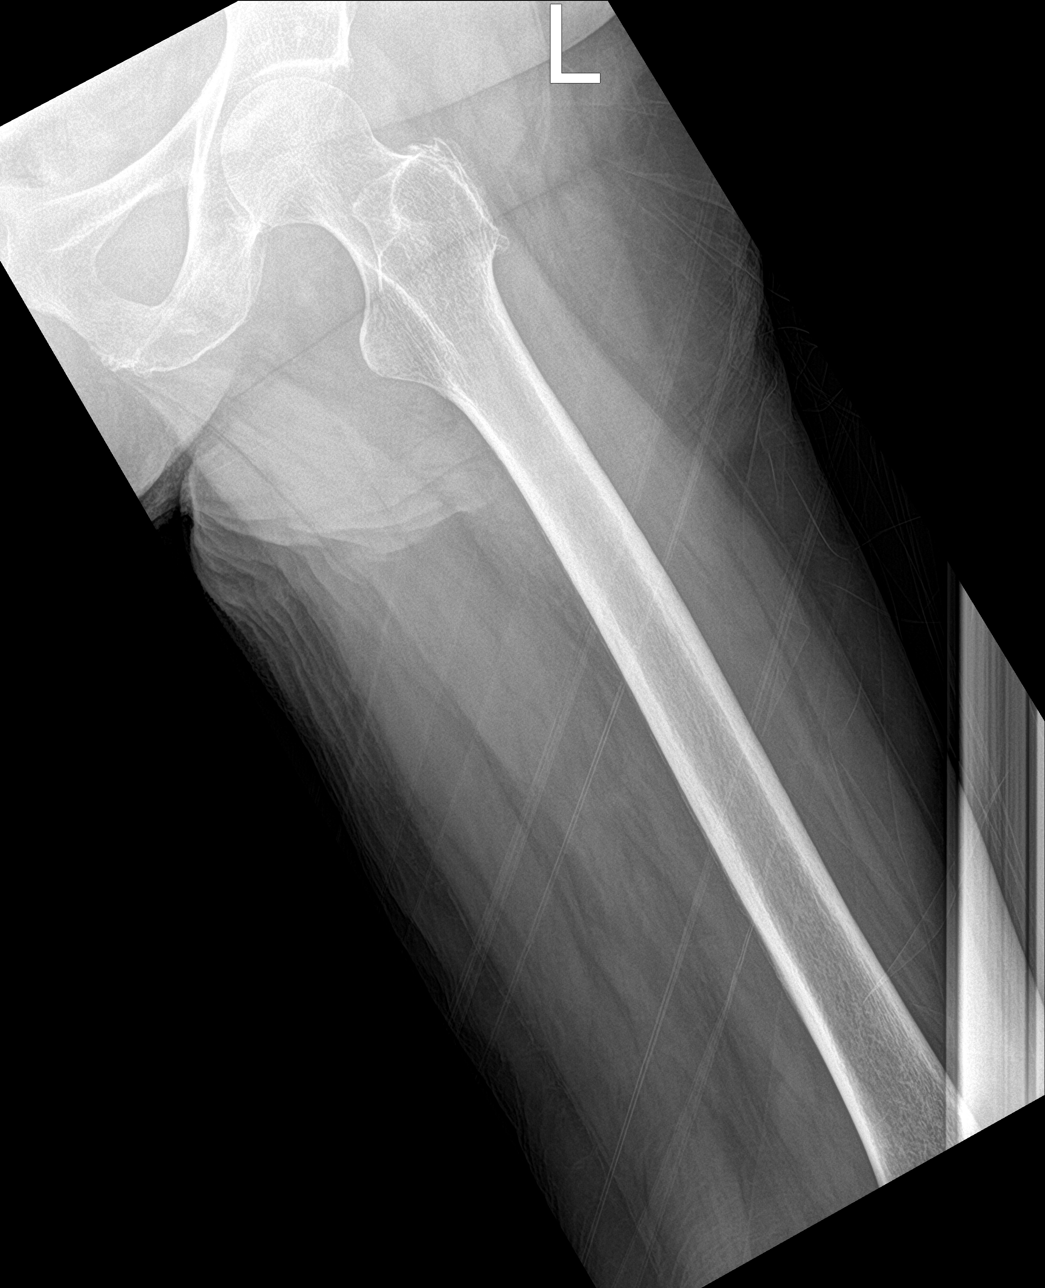

[femur lat (2 of 2)]
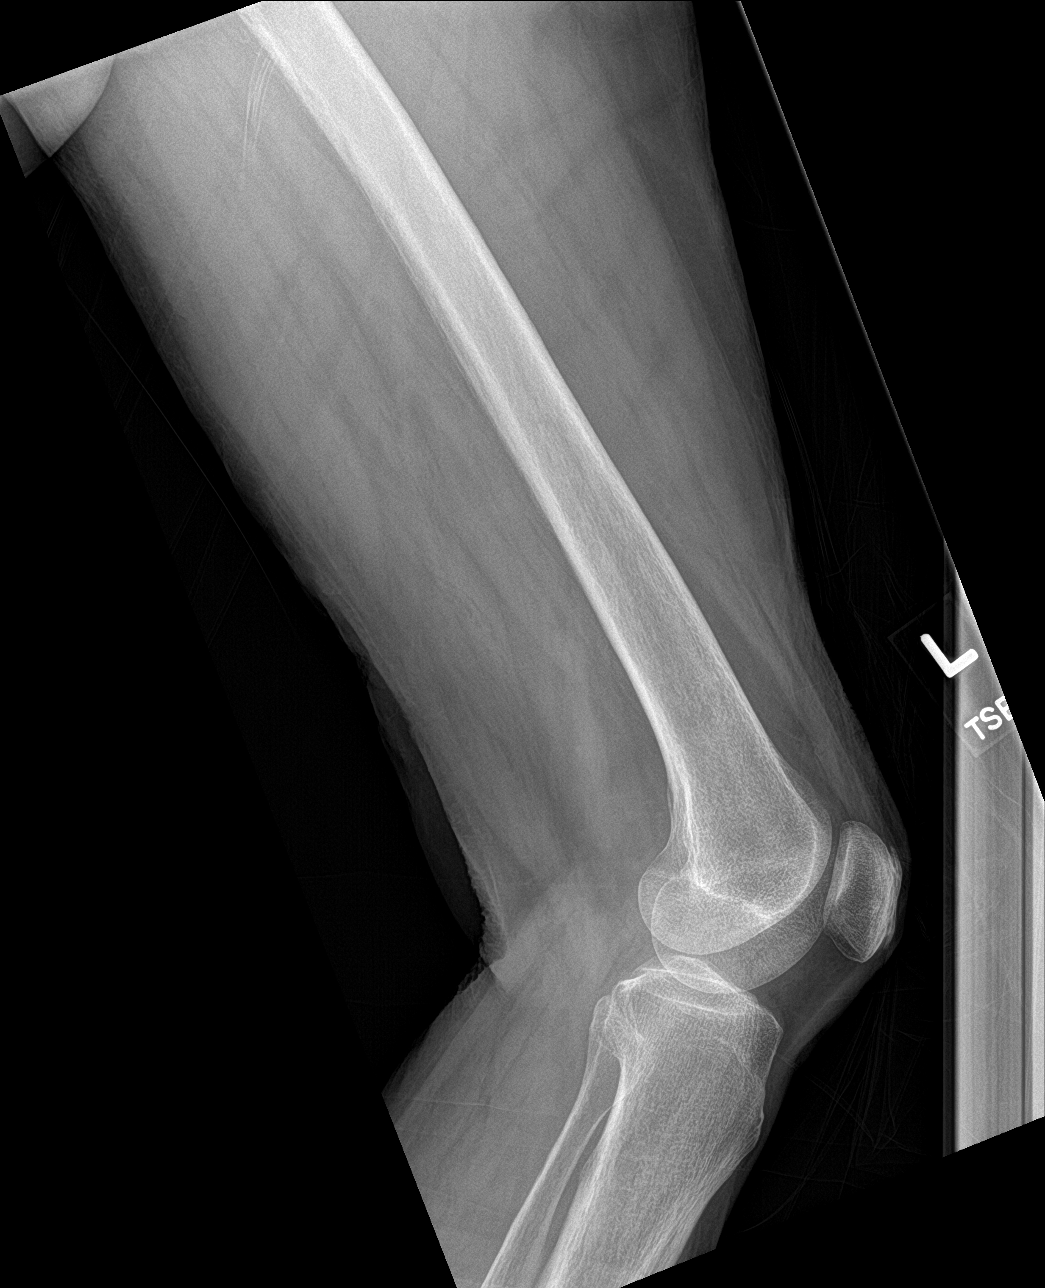

[4 of 4 positions shown; findings below may reference images not displayed]

FINDINGS: Mild degenerative changes of the hip joint are seen. No acute
fracture or dislocation is noted. No soft tissue abnormality is
seen.
IMPRESSION: No acute abnormality noted.

## 2021-09-04 ENCOUNTER — Other Ambulatory Visit: Payer: Self-pay | Admitting: Family Medicine

## 2021-09-08 ENCOUNTER — Other Ambulatory Visit: Payer: Self-pay

## 2021-09-08 ENCOUNTER — Ambulatory Visit
Admission: RE | Admit: 2021-09-08 | Discharge: 2021-09-08 | Disposition: A | Discharge status: Home / Self Care | Payer: Medicare Other | Source: Ambulatory Visit | Attending: Family Medicine | Admitting: Family Medicine

## 2021-09-08 DIAGNOSIS — I700 Atherosclerosis of aorta: Secondary | ICD-10-CM | POA: Diagnosis not present

## 2021-09-08 DIAGNOSIS — Z85118 Personal history of other malignant neoplasm of bronchus and lung: Secondary | ICD-10-CM

## 2021-09-08 DIAGNOSIS — R911 Solitary pulmonary nodule: Secondary | ICD-10-CM | POA: Diagnosis not present

## 2021-09-08 DIAGNOSIS — R918 Other nonspecific abnormal finding of lung field: Secondary | ICD-10-CM | POA: Diagnosis not present

## 2021-09-13 ENCOUNTER — Encounter: Payer: Self-pay | Admitting: Family Medicine

## 2021-09-13 ENCOUNTER — Telehealth: Payer: Self-pay | Admitting: Family Medicine

## 2021-09-13 ENCOUNTER — Other Ambulatory Visit: Payer: Self-pay | Admitting: *Deleted

## 2021-09-13 DIAGNOSIS — R5381 Other malaise: Secondary | ICD-10-CM

## 2021-09-13 DIAGNOSIS — R2681 Unsteadiness on feet: Secondary | ICD-10-CM

## 2021-09-13 NOTE — Patient Outreach (Signed)
[facility]) Care Management  09/13/2021  Dorraine Ellender 1946/01/20 [id number]   Outgoing call placed to member's daughter, Delores.  Denies any urgent concerns, encouraged to contact this care manager with questions.  Agrees to follow up within the next 2 weeks in regards to request for additional home health services.   Care Plan : RNCM Plan of Care  Updates made by [name], RN since 09/13/2021 12:00 AM     Problem: Chronic care management of multiple chronic conditions (CHF, DM, COPD)   Priority: High     Long-Range Goal: Ability for member and family to adequately manage chronic conditions (CHF, DM, COPD)   Start Date: 08/16/2021  Expected End Date: 02/12/2022  This Visit's Progress: On track  Priority: High  Note:   Current Barriers:  Knowledge Deficits related to plan of care for management of CHF, COPD, and DMII Chronic Disease Management support and education needs related to CHF, COPD, and DMII  RNCM Clinical Goal(s):  Patient will verbalize understanding of plan for management of CHF, COPD, and DMII take all medications exactly as prescribed and will call provider for medication related questions attend all scheduled medical appointments: PCP continue to work with RN Care Manager to address care management and care coordination needs related to  CHF, COPD, and DMII work with [facility]  to increase strength and mobility through PT.  Noted per chart, HH sessions have been approved for extension demonstrate a decrease in CHF, COPD, and DMII exacerbations    through collaboration with RN Care manager, provider, and care team.   Interventions: Inter-disciplinary care team collaboration (see longitudinal plan of care) Evaluation of current treatment plan related to  self management and patient's adherence to plan as established by provider   Diabetes Interventions: Assessed patient's understanding of A1c goal: <6.5% Provided education to patient about  basic DM disease process; Discussed plans with patient for ongoing care management follow up and provided patient with direct contact information for care management team; Advised patient, providing education and rationale, to check cbg daily and record, calling PCP for findings outside established parameters; Screening for signs and symptoms of depression related to chronic disease state;  Lab Results  Component Value Date   HGBA1C 6.9 (H) 08/03/2021   COPD Interventions:   Provided patient with basic written and verbal COPD education on self care/management/and exacerbation prevention; Advised patient to track and manage COPD triggers;  Advised patient to self assesses COPD action plan zone and make appointment with provider if in the yellow zone for 48 hours without improvement; Discussed the importance of adequate rest and management of fatigue with COPD; Screening for signs and symptoms of depression related to chronic disease state;  Advised daughter to call PCP office as long nodule was noted on most recent CXR, recommended to have CT scan, not scheduled yet.  She was provided antibiotics and steroids recently for pneumonia, per daughter, this has improved.  Heart Failure Interventions: Basic overview and discussion of pathophysiology of Heart Failure reviewed; Provided education on low sodium diet; Provided education about placing scale on hard, flat surface; Advised patient to weigh each morning after emptying bladder; Discussed the importance of keeping all appointments with provider;  Patient Goals/Self-Care Activities: Patient will self administer medications as prescribed Patient will attend all scheduled provider appointments Patient will continue to perform ADL's independently  Follow Up Plan:  Telephone follow up appointment with care management team member scheduled for:  12/5   Update 11/21 - Per daughter,  member is "ok."  State she has decreased strength in the last  couple weeks as home health PT has completed.  She was told that they would request an extension but has not heard anything back from office.  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  She had chest CT done last week, waiting call from PCP office for follow up and test results.  Call placed to [facility] to follow up on renewal of PT order, state they do not have an open case for member currently and would need new orders.  Call placed to PCP office, message left requesting call back and/or new orders for home health PT.      [name], [place], MSN [place] [phone]

## 2021-09-13 NOTE — Telephone Encounter (Signed)
Okay for PT extension?

## 2021-09-13 NOTE — Telephone Encounter (Signed)
Yes okay 

## 2021-09-14 NOTE — Telephone Encounter (Signed)
Left secure VM for [facility] regarding PT extension, verbal okay per provider. See message below

## 2021-09-14 NOTE — Addendum Note (Signed)
Addended by: Deveron Furlong D on: 09/14/2021 04:07 PM   Modules accepted: Orders

## 2021-09-18 ENCOUNTER — Other Ambulatory Visit: Payer: Self-pay | Admitting: Family Medicine

## 2021-09-27 ENCOUNTER — Other Ambulatory Visit: Payer: Self-pay | Admitting: *Deleted

## 2021-09-27 NOTE — Patient Outreach (Addendum)
[facility] [facility]) Care Management  09/27/2021  Khailee Mick [birth year] [id number]   Outgoing call placed to member's daughter to follow up on restarting home health PT, no answer, HIPAA compliant voice message left.  Will follow up within the next 3-5 business days.  [name], [place], MSN [place] [phone]

## 2021-09-30 ENCOUNTER — Telehealth: Payer: Self-pay | Admitting: Family Medicine

## 2021-09-30 ENCOUNTER — Other Ambulatory Visit: Payer: Self-pay | Admitting: *Deleted

## 2021-09-30 ENCOUNTER — Encounter (INDEPENDENT_AMBULATORY_CARE_PROVIDER_SITE_OTHER): Payer: Medicare Other | Admitting: Ophthalmology

## 2021-09-30 ENCOUNTER — Other Ambulatory Visit: Payer: Self-pay

## 2021-09-30 DIAGNOSIS — H43813 Vitreous degeneration, bilateral: Secondary | ICD-10-CM

## 2021-09-30 DIAGNOSIS — I10 Essential (primary) hypertension: Secondary | ICD-10-CM | POA: Diagnosis not present

## 2021-09-30 DIAGNOSIS — E113591 Type 2 diabetes mellitus with proliferative diabetic retinopathy without macular edema, right eye: Secondary | ICD-10-CM | POA: Diagnosis not present

## 2021-09-30 DIAGNOSIS — H35033 Hypertensive retinopathy, bilateral: Secondary | ICD-10-CM

## 2021-09-30 DIAGNOSIS — H35372 Puckering of macula, left eye: Secondary | ICD-10-CM

## 2021-09-30 DIAGNOSIS — E113512 Type 2 diabetes mellitus with proliferative diabetic retinopathy with macular edema, left eye: Secondary | ICD-10-CM | POA: Diagnosis not present

## 2021-09-30 NOTE — Telephone Encounter (Signed)
Referral placed on 11/22 refaxed to [place].

## 2021-09-30 NOTE — Patient Outreach (Signed)
[facility]) Care Management  09/30/2021  [name] 1945/11/04 [id number]   Outreach attempt #2, successful to daughter.  Denies being contacted by [facility] regarding new PT order.  State she discussed with PCP and was told order was sent.  Call placed to [name], notified they have not received new orders for services, are able to receive at fax number [phone].  Call placed to PCP office, requested home health orders be sent directly to [name], fax number provided.  Member's daughter updated, will follow up within the next 2 weeks.  [name], [place], MSN [place] [phone]

## 2021-10-01 ENCOUNTER — Other Ambulatory Visit: Payer: Self-pay | Admitting: *Deleted

## 2021-10-01 NOTE — Patient Outreach (Signed)
[place] [facility]) Care Management  [place]  10/02/2021   Christeen Lai 08/23/1946 [id number]   Outgoing call placed to member's daughter, successful.  Denies any urgent concerns, encouraged to contact this care manager with questions.    Encounter Medications:  Outpatient Encounter Medications as of 10/01/2021  Medication Sig   albuterol (PROVENTIL) (2.5 MG/3ML) 0.083% nebulizer solution USE 1 VIAL IN NEBULIZER EVERY 4 HOURS AS NEEDED FOR WHEEZING AND FOR SHORTNESS OF BREATH   albuterol (VENTOLIN HFA) 108 (90 Base) MCG/ACT inhaler INHALE 2 PUFFS BY MOUTH EVERY 6 HOURS AS NEEDED FOR WHEEZING OR SHORTNESS OF BREATH   aspirin 81 MG EC tablet Take 81 mg by mouth daily.    atorvastatin (LIPITOR) 40 MG tablet Take 1 tablet by mouth once daily   buPROPion (WELLBUTRIN XL) 150 MG 24 hr tablet Take 1 tablet by mouth once daily   cetirizine (ZYRTEC) 10 MG tablet Take 10 mg by mouth daily.   escitalopram (LEXAPRO) 20 MG tablet Take 1 tablet by mouth once daily   fluticasone (FLOVENT HFA) 220 MCG/ACT inhaler Inhale 2 puffs into the lungs 2 (two) times daily.   ibandronate (BONIVA) 150 MG tablet Take 1 tablet (150 mg total) by mouth every 30 (thirty) days. Take in the morning with a full glass of water, on an empty stomach, and do not take anything else by mouth or lie down for the next 30 min.   insulin isophane & regular human (HUMULIN 70/30 KWIKPEN) (70-30) 100 UNIT/ML KwikPen INJECT 4 UNITS SUBCUTANEOUSLY WITH BREAKFAST AND 2 UNITS WITH SUPPER   Lancets (ONETOUCH DELICA PLUS KAJGOT15B) MISC USE 1  TO CHECK GLUCOSE THREE TIMES DAILY   metoprolol tartrate (LOPRESSOR) 25 MG tablet Take 1/2 (one-half) tablet by mouth twice daily   Multiple Vitamins-Minerals (MULTIVITAMIN WOMEN PO) Take by mouth daily.   ONETOUCH ULTRA test strip USE 1 STRIP TO CHECK GLUCOSE THREE TIMES DAILY   pantoprazole (PROTONIX) 40 MG tablet Take 1 tablet by mouth once daily   No  facility-administered encounter medications on file as of 10/01/2021.    Functional Status:  In your present state of health, do you have any difficulty performing the following activities: 12/16/2020  Hearing? N  Vision? N  Difficulty concentrating or making decisions? 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 Y  Dressing or bathing? N  Doing errands, shopping? Y  Preparing Food and eating ? Y  Using the Toilet? N  In the past six months, have you accidently leaked urine? Y  Comment wears depends  Do you have problems with loss of bowel control? N  Managing your Medications? Y  Managing your Finances? Y  Housekeeping or managing your Housekeeping? Y  Some recent data might be hidden    Fall/Depression Screening: Fall Risk  08/02/2021 04/19/2021 03/23/2021  Falls in the past year? 0 1 0  Comment - - -  Number falls in past yr: 0 0 0  Comment - - -  Injury with Fall? 0 1 0  Risk Factor Category  - - -  Risk for fall due to : - History of fall(s);Impaired balance/gait Impaired balance/gait  Risk for fall due to: Comment - - -  Follow up Falls evaluation completed Falls evaluation completed -  Comment - - -   PHQ 2/9 Scores 08/02/2021 03/23/2021 12/16/2020 08/29/2019 08/09/2019 04/19/2019 12/14/2018  PHQ - 2 Score 0 - 0 1 3 3  1  PHQ- 9 Score - - - 9 9 8  -  Exception Documentation - Other- indicate reason in comment box - - - - -    Assessment:   Care Plan Care Plan : RNCM Plan of Care  Updates made by [name], RN since 10/02/2021 12:00 AM     Problem: Chronic care management of multiple chronic conditions (CHF, DM, COPD)   Priority: High     Long-Range Goal: Ability for member and family to adequately manage chronic conditions (CHF, DM, COPD)   Start Date: 08/16/2021  Expected End Date: 02/12/2022  This Visit's Progress: On track  Recent Progress: On track  Priority: High  Note:   Current Barriers:  Knowledge  Deficits related to plan of care for management of CHF, COPD, and DMII Chronic Disease Management support and education needs related to CHF, COPD, and DMII  RNCM Clinical Goal(s):  Patient will verbalize understanding of plan for management of CHF, COPD, and DMII take all medications exactly as prescribed and will call provider for medication related questions attend all scheduled medical appointments: PCP continue to work with RN Care Manager to address care management and care coordination needs related to  CHF, COPD, and DMII work with [facility]  to increase strength and mobility through PT.  Noted per chart, HH sessions have been approved for extension demonstrate a decrease in CHF, COPD, and DMII exacerbations    through collaboration with RN Care manager, provider, and care team.   Interventions: Inter-disciplinary care team collaboration (see longitudinal plan of care) Evaluation of current treatment plan related to  self management and patient's adherence to plan as established by provider   Diabetes Interventions: Assessed patient's understanding of A1c goal: <6.5% Provided education to patient about basic DM disease process; Discussed plans with patient for ongoing care management follow up and provided patient with direct contact information for care management team; Advised patient, providing education and rationale, to check cbg daily and record, calling PCP for findings outside established parameters; Screening for signs and symptoms of depression related to chronic disease state;  Lab Results  Component Value Date   HGBA1C 6.9 (H) 08/03/2021   COPD Interventions:   Provided patient with basic written and verbal COPD education on self care/management/and exacerbation prevention; Advised patient to track and manage COPD triggers;  Advised patient to self assesses COPD action plan zone and make appointment with provider if in the yellow zone for 48 hours without  improvement; Discussed the importance of adequate rest and management of fatigue with COPD; Screening for signs and symptoms of depression related to chronic disease state;  Advised daughter to call PCP office as long nodule was noted on most recent CXR, recommended to have CT scan, not scheduled yet.  She was provided antibiotics and steroids recently for pneumonia, per daughter, this has improved.  Heart Failure Interventions: Basic overview and discussion of pathophysiology of Heart Failure reviewed; Provided education on low sodium diet; Provided education about placing scale on hard, flat surface; Advised patient to weigh each morning after emptying bladder; Discussed the importance of keeping all appointments with provider;  Patient Goals/Self-Care Activities: Patient will self administer medications as prescribed Patient will attend all scheduled provider appointments Patient will continue to perform ADL's independently  Follow Up Plan:  Telephone follow up appointment with care management team member scheduled for:  10/29/2021   Update 11/21 - Per daughter, member is "ok."  State she has decreased strength in the last couple weeks as home health PT  has completed.  She was told that they would request an extension but has not heard anything back from office.  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  She had chest CT done last week, waiting call from PCP office for follow up and test results.  Call placed to [facility] to follow up on renewal of PT order, state they do not have an open case for member currently and would need new orders.  Call placed to PCP office, message left requesting call back and/or new orders for home health PT.     Update 12/9 - Daughter report member is about the same.  She has not been able to get her to consistently do strengthening exercises independently at home.  Advised that this  care manager was able to speak with Vevelyn Royals at PCP office.  They attempted to provide referral to [facility] however they were told that insurance would not approve it as member has not had a change in status, such as repeat hospital admission.  Daughter verbalizes understanding, state she is off the next 2 weeks and will work with her on exercises provided by PT.  Hoping to help member improve strength, if member declines will speak with PCP and have reassessment done.           Plan:  Follow-up: Patient agrees to Care Plan and Follow-up. Follow-up in 1 month(s).  [name], [place], MSN [name] [phone]

## 2021-10-01 NOTE — Telephone Encounter (Signed)
LVM for [facility] regarding update received today for pt.  [name], CMA Good morning! Wanted to give you an update on [name]. Unfortunately, we are unable to accept this at this time. She was recently discharged from our services less than a month ago. Unless she has had a change in status insurance will not approve authorization so soon. Thanks for reaching out to [place] though.   Hope you have a great weekend!  [place]

## 2021-10-01 NOTE — Telephone Encounter (Signed)
Spoke with [facility] regarding update, she stated pt's family felt she would benefit from continuing [facility] because she was already making progress. She will notify family of referral update and let me know of any change.

## 2021-10-13 ENCOUNTER — Telehealth: Payer: Self-pay

## 2021-10-13 NOTE — Telephone Encounter (Signed)
Spoke with pt's daughter, [name] and advised OV would be needed for [facility] visits to start. She voiced understanding and appt scheduled.

## 2021-10-13 NOTE — Telephone Encounter (Signed)
-----   Message from [facility] sent at 10/13/2021  2:15 PM EST ----- I finally found a [place] to take [name] insurace. But this is the message they sent me back. Please let me know if you are able to get her scheduled. So we can proceed. Her last visit was back in Sept.   "Just realized that [name] has not been seen within the last 60days by provider either. She will need to have a face to face visit before we can see her as well. It can be virtual audio and visual or in office visit."  Thanks  Sherri

## 2021-10-14 ENCOUNTER — Ambulatory Visit: Payer: Medicare Other | Admitting: *Deleted

## 2021-10-19 ENCOUNTER — Emergency Department (HOSPITAL_COMMUNITY): Payer: Medicare Other

## 2021-10-19 ENCOUNTER — Encounter (HOSPITAL_COMMUNITY): Payer: Self-pay | Admitting: Emergency Medicine

## 2021-10-19 ENCOUNTER — Observation Stay (HOSPITAL_COMMUNITY)
Admission: EM | Admit: 2021-10-19 | Discharge: 2021-10-21 | Disposition: A | Discharge status: Home Health | Payer: Medicare Other | Attending: Internal Medicine | Admitting: Internal Medicine

## 2021-10-19 DIAGNOSIS — I5032 Chronic diastolic (congestive) heart failure: Secondary | ICD-10-CM | POA: Insufficient documentation

## 2021-10-19 DIAGNOSIS — F0390 Unspecified dementia without behavioral disturbance: Secondary | ICD-10-CM | POA: Diagnosis not present

## 2021-10-19 DIAGNOSIS — I10 Essential (primary) hypertension: Secondary | ICD-10-CM | POA: Diagnosis not present

## 2021-10-19 DIAGNOSIS — J441 Chronic obstructive pulmonary disease with (acute) exacerbation: Secondary | ICD-10-CM | POA: Diagnosis present

## 2021-10-19 DIAGNOSIS — Z794 Long term (current) use of insulin: Secondary | ICD-10-CM | POA: Insufficient documentation

## 2021-10-19 DIAGNOSIS — I130 Hypertensive heart and chronic kidney disease with heart failure and stage 1 through stage 4 chronic kidney disease, or unspecified chronic kidney disease: Secondary | ICD-10-CM | POA: Insufficient documentation

## 2021-10-19 DIAGNOSIS — E11319 Type 2 diabetes mellitus with unspecified diabetic retinopathy without macular edema: Secondary | ICD-10-CM | POA: Insufficient documentation

## 2021-10-19 DIAGNOSIS — J449 Chronic obstructive pulmonary disease, unspecified: Secondary | ICD-10-CM | POA: Insufficient documentation

## 2021-10-19 DIAGNOSIS — Z87891 Personal history of nicotine dependence: Secondary | ICD-10-CM | POA: Diagnosis not present

## 2021-10-19 DIAGNOSIS — E1122 Type 2 diabetes mellitus with diabetic chronic kidney disease: Secondary | ICD-10-CM | POA: Insufficient documentation

## 2021-10-19 DIAGNOSIS — E871 Hypo-osmolality and hyponatremia: Secondary | ICD-10-CM | POA: Diagnosis not present

## 2021-10-19 DIAGNOSIS — G319 Degenerative disease of nervous system, unspecified: Secondary | ICD-10-CM | POA: Diagnosis not present

## 2021-10-19 DIAGNOSIS — E860 Dehydration: Secondary | ICD-10-CM | POA: Insufficient documentation

## 2021-10-19 DIAGNOSIS — N183 Chronic kidney disease, stage 3 unspecified: Secondary | ICD-10-CM | POA: Insufficient documentation

## 2021-10-19 DIAGNOSIS — R531 Weakness: Secondary | ICD-10-CM | POA: Diagnosis not present

## 2021-10-19 DIAGNOSIS — R8281 Pyuria: Secondary | ICD-10-CM | POA: Insufficient documentation

## 2021-10-19 DIAGNOSIS — I2510 Atherosclerotic heart disease of native coronary artery without angina pectoris: Secondary | ICD-10-CM | POA: Insufficient documentation

## 2021-10-19 DIAGNOSIS — R059 Cough, unspecified: Secondary | ICD-10-CM | POA: Diagnosis not present

## 2021-10-19 DIAGNOSIS — Z7982 Long term (current) use of aspirin: Secondary | ICD-10-CM | POA: Diagnosis not present

## 2021-10-19 DIAGNOSIS — Z8673 Personal history of transient ischemic attack (TIA), and cerebral infarction without residual deficits: Secondary | ICD-10-CM | POA: Insufficient documentation

## 2021-10-19 DIAGNOSIS — Z79899 Other long term (current) drug therapy: Secondary | ICD-10-CM | POA: Diagnosis not present

## 2021-10-19 DIAGNOSIS — Z85118 Personal history of other malignant neoplasm of bronchus and lung: Secondary | ICD-10-CM | POA: Insufficient documentation

## 2021-10-19 DIAGNOSIS — U071 COVID-19: Secondary | ICD-10-CM | POA: Diagnosis not present

## 2021-10-19 DIAGNOSIS — I639 Cerebral infarction, unspecified: Secondary | ICD-10-CM | POA: Diagnosis not present

## 2021-10-19 HISTORY — DX: Diverticulitis of large intestine with perforation and abscess without bleeding: K57.20

## 2021-10-19 HISTORY — DX: Acute kidney failure, unspecified: N17.9

## 2021-10-19 LAB — URINALYSIS, ROUTINE W REFLEX MICROSCOPIC
Bilirubin Urine: NEGATIVE
Glucose, UA: 50 mg/dL — AB
Hgb urine dipstick: NEGATIVE
Ketones, ur: NEGATIVE mg/dL
Nitrite: NEGATIVE
Protein, ur: NEGATIVE mg/dL
Specific Gravity, Urine: 1.014 (ref 1.005–1.030)
WBC, UA: >50 WBC/hpf — ABNORMAL HIGH (ref 0–5)
pH: 5 (ref 5.0–8.0)

## 2021-10-19 LAB — CBC
HCT: 36.4 % (ref 36.0–46.0)
Hemoglobin: 11.9 g/dL — ABNORMAL LOW (ref 12.0–15.0)
MCH: 30.1 pg (ref 26.0–34.0)
MCHC: 32.7 g/dL (ref 30.0–36.0)
MCV: 91.9 fL (ref 80.0–100.0)
Platelets: 180 10*3/uL (ref 150–400)
RBC: 3.96 MIL/uL (ref 3.87–5.11)
RDW: 14.3 % (ref 11.5–15.5)
WBC: 9.8 10*3/uL (ref 4.0–10.5)
nRBC: 0 % (ref 0.0–0.2)

## 2021-10-19 LAB — BASIC METABOLIC PANEL
Anion gap: 10 (ref 5–15)
BUN: 21 mg/dL (ref 8–23)
CO2: 22 mmol/L (ref 22–32)
Calcium: 8.8 mg/dL — ABNORMAL LOW (ref 8.9–10.3)
Chloride: 97 mmol/L — ABNORMAL LOW (ref 98–111)
Creatinine, Ser: 1.11 mg/dL — ABNORMAL HIGH (ref 0.44–1.00)
GFR, Estimated: 52 mL/min — ABNORMAL LOW (ref >60)
Glucose, Bld: 281 mg/dL — ABNORMAL HIGH (ref 70–99)
Potassium: 4.3 mmol/L (ref 3.5–5.1)
Sodium: 129 mmol/L — ABNORMAL LOW (ref 135–145)

## 2021-10-19 LAB — RESP PANEL BY RT-PCR (FLU A&B, COVID) ARPGX2
Influenza A by PCR: NEGATIVE
Influenza B by PCR: NEGATIVE
SARS Coronavirus 2 by RT PCR: POSITIVE — AB

## 2021-10-19 LAB — GLUCOSE, CAPILLARY
Glucose-Capillary: 105 mg/dL — ABNORMAL HIGH (ref 70–99)
Glucose-Capillary: 200 mg/dL — ABNORMAL HIGH (ref 70–99)

## 2021-10-19 MED ORDER — ALBUTEROL SULFATE (2.5 MG/3ML) 0.083% IN NEBU: 2.5000 mg | INHALATION_SOLUTION | RESPIRATORY_TRACT | Status: DC | PRN

## 2021-10-19 MED ORDER — LINAGLIPTIN 5 MG PO TABS
5.0000 mg | ORAL_TABLET | Freq: Every day | ORAL | Status: DC
Start: 2021-10-19 — End: 2021-10-21
  Administered 2021-10-19 – 2021-10-21 (×3): 5 mg via ORAL
  Filled 2021-10-19 (×3): qty 1

## 2021-10-19 MED ORDER — INSULIN ASPART 100 UNIT/ML IJ SOLN
0.0000 [IU] | Freq: Three times a day (TID) | INTRAMUSCULAR | Status: DC
  Administered 2021-10-20: 18:00:00 2 [IU] via SUBCUTANEOUS
  Administered 2021-10-20: 13:00:00 1 [IU] via SUBCUTANEOUS
  Filled 2021-10-19: qty 0.09

## 2021-10-19 MED ORDER — ONDANSETRON HCL 4 MG/2ML IJ SOLN: 4.0000 mg | Freq: Four times a day (QID) | INTRAMUSCULAR | Status: DC | PRN

## 2021-10-19 MED ORDER — ACETAMINOPHEN 650 MG RE SUPP: 650.0000 mg | Freq: Four times a day (QID) | RECTAL | Status: DC | PRN

## 2021-10-19 MED ORDER — IPRATROPIUM-ALBUTEROL 0.5-2.5 (3) MG/3ML IN SOLN
3.0000 mL | Freq: Two times a day (BID) | RESPIRATORY_TRACT | Status: DC
  Administered 2021-10-19 – 2021-10-21 (×4): 3 mL via RESPIRATORY_TRACT
  Filled 2021-10-19 (×4): qty 3

## 2021-10-19 MED ORDER — PANTOPRAZOLE SODIUM 40 MG PO TBEC
40.0000 mg | DELAYED_RELEASE_TABLET | Freq: Every day | ORAL | Status: DC
  Administered 2021-10-20 – 2021-10-21 (×2): 40 mg via ORAL
  Filled 2021-10-19 (×2): qty 1

## 2021-10-19 MED ORDER — METOPROLOL TARTRATE 12.5 MG HALF TABLET
12.5000 mg | ORAL_TABLET | Freq: Two times a day (BID) | ORAL | Status: DC
  Administered 2021-10-19 – 2021-10-21 (×4): 12.5 mg via ORAL
  Filled 2021-10-19 (×5): qty 1

## 2021-10-19 MED ORDER — ONDANSETRON HCL 4 MG PO TABS: 4.0000 mg | ORAL_TABLET | Freq: Four times a day (QID) | ORAL | Status: DC | PRN

## 2021-10-19 MED ORDER — BUPROPION HCL ER (XL) 150 MG PO TB24
150.0000 mg | ORAL_TABLET | Freq: Every day | ORAL | Status: DC
  Administered 2021-10-20 – 2021-10-21 (×2): 150 mg via ORAL
  Filled 2021-10-19 (×2): qty 1

## 2021-10-19 MED ORDER — NIRMATRELVIR/RITONAVIR (PAXLOVID) TABLET (RENAL DOSING)
2.0000 | ORAL_TABLET | Freq: Two times a day (BID) | ORAL | Status: DC
  Administered 2021-10-19 – 2021-10-21 (×4): 2 via ORAL
  Filled 2021-10-19: qty 20

## 2021-10-19 MED ORDER — NIRMATRELVIR/RITONAVIR (PAXLOVID)TABLET
3.0000 | ORAL_TABLET | Freq: Two times a day (BID) | ORAL | Status: DC
Start: 2021-10-19 — End: 2021-10-19

## 2021-10-19 MED ORDER — PREDNISONE 20 MG PO TABS
40.0000 mg | ORAL_TABLET | Freq: Every day | ORAL | Status: DC
  Administered 2021-10-20 – 2021-10-21 (×2): 40 mg via ORAL
  Filled 2021-10-19 (×2): qty 2

## 2021-10-19 MED ORDER — LACTATED RINGERS IV BOLUS
500.0000 mL | Freq: Once | INTRAVENOUS | Status: AC
  Administered 2021-10-19: 13:00:00 500 mL via INTRAVENOUS

## 2021-10-19 MED ORDER — ACETAMINOPHEN 325 MG PO TABS
650.0000 mg | ORAL_TABLET | Freq: Four times a day (QID) | ORAL | Status: DC | PRN
  Filled 2021-10-19: qty 2

## 2021-10-19 MED ORDER — SODIUM CHLORIDE 0.9 % IV SOLN: INTRAVENOUS | Status: AC

## 2021-10-19 MED ORDER — ENOXAPARIN SODIUM 40 MG/0.4ML IJ SOSY
40.0000 mg | PREFILLED_SYRINGE | INTRAMUSCULAR | Status: DC
  Administered 2021-10-19 – 2021-10-20 (×2): 40 mg via SUBCUTANEOUS
  Filled 2021-10-19 (×2): qty 0.4

## 2021-10-19 MED ORDER — ASPIRIN EC 81 MG PO TBEC
81.0000 mg | DELAYED_RELEASE_TABLET | Freq: Every day | ORAL | Status: DC
  Administered 2021-10-20 – 2021-10-21 (×2): 81 mg via ORAL
  Filled 2021-10-19 (×2): qty 1

## 2021-10-19 MED ORDER — ATORVASTATIN CALCIUM 40 MG PO TABS
40.0000 mg | ORAL_TABLET | Freq: Every day | ORAL | Status: DC
  Administered 2021-10-19 – 2021-10-21 (×3): 40 mg via ORAL
  Filled 2021-10-19 (×3): qty 1

## 2021-10-19 MED ORDER — SODIUM CHLORIDE 0.9 % IV SOLN
1.0000 g | Freq: Once | INTRAVENOUS | Status: AC
  Administered 2021-10-19: 18:00:00 1 g via INTRAVENOUS
  Filled 2021-10-19: qty 10

## 2021-10-19 MED ORDER — ESCITALOPRAM OXALATE 20 MG PO TABS
20.0000 mg | ORAL_TABLET | Freq: Every day | ORAL | Status: DC
  Administered 2021-10-20 – 2021-10-21 (×2): 20 mg via ORAL
  Filled 2021-10-19 (×2): qty 1

## 2021-10-19 MED ORDER — LORATADINE 10 MG PO TABS
10.0000 mg | ORAL_TABLET | Freq: Every day | ORAL | Status: DC
  Administered 2021-10-19 – 2021-10-21 (×3): 10 mg via ORAL
  Filled 2021-10-19 (×3): qty 1

## 2021-10-19 NOTE — Hospital Course (Signed)
[name] is a 75 y.o. F with mild dementia lives with daughter, COPD not on O2, hx NSCLC s/p chemo-rad no surgery, diverticular abscess resolved, Takotsubo CM resolved, HTN and DM who presented with weakness for 1 day.  On the day of admission, she was noted to be too weak to get up at all or stand.  Maybe she had reduced appetite and cough, although close to baseline.   No fever, confusion, respiratory distress, dysuria, urgency, urinary frequency.  In the ER, vital signs unremarkable but patient unable to stand.  Mild hyponatremia, no hypoxia, chest x-ray without opacities.  COVID-positive.     History collected from the daughter at the bedside as the patient has had whole brain radiation and is forgetful.

## 2021-10-19 NOTE — H&P (Signed)
History and Physical    Patient: [name] PJK:[id number] DOB: 22-Dec-1945 DOA: 10/19/2021 DOS: the patient was seen and examined on 10/19/2021 PCP: [name], MD  Patient coming from: Home  Chief Complaint: Weakness/fatigue  HPI:  [name] is a 75 y.o. F with mild dementia lives with daughter, COPD not on O2, hx NSCLC s/p chemo-rad no surgery, diverticular abscess resolved, Takotsubo CM resolved, HTN and DM who presented with weakness for 1 day.  On the day of admission, she was noted to be too weak to get up at all or stand.  Maybe she had reduced appetite and cough, although close to baseline.   No fever, confusion, respiratory distress, dysuria, urgency, urinary frequency.  In the ER, vital signs unremarkable but patient unable to stand.  Mild hyponatremia, no hypoxia, chest x-ray without opacities.  COVID-positive.     History collected from the daughter at the bedside as the patient has had whole brain radiation and is forgetful.  EDP thought the patient might have stroke and ordered MRI which was normal.  EDP thought patient might have UTI and ordered Rocephin.     Review of Systems: Review of Systems  Constitutional:  Positive for malaise/fatigue. Negative for chills and fever.  Respiratory:  Positive for cough and wheezing. Negative for sputum production and shortness of breath.   Genitourinary:  Negative for dysuria, flank pain, frequency, hematuria and urgency.  Neurological:  Positive for weakness. Negative for sensory change, speech change, focal weakness and loss of consciousness.     Past Medical History:  Diagnosis Date   Acute renal failure (HCC)    Age-related nuclear cataract of both eyes 2016   +cortical age related cataracts OU    Anxiety and depression    Bilateral diabetic retinopathy ([place]) 2015   Dr. 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   Colonic diverticular abscess    COPD (chronic obstructive pulmonary disease) (HCC)    CVA (cerebral vascular accident) ([place]) 09/2016   MRI did show a left-sided small ischemic stroke which was acute but likely incidental (MRI was done b/c pt had TIA sx's in hospital).  Neuro put her on plavix at that time.  Cardiology d/c'd her plavix 08/2017.   Diabetes mellitus with complication (HCC)    diab retinopathy OU (laser)   Diverticulitis of large intestine with perforation and abscess 09/11/2016   Finger fracture, right 04/2020   R 5th metacarpal fx at the base (Dr. Mardelle Matte)   GERD (gastroesophageal reflux disease)    protonix   History of concussion 08/25/2018   w/out loss of consciousness--mild increase in baseline memory impairment after.  CT head neg acute.   History of diverticulitis of colon    with abscess; required IR percutaneous drain placed 09/2016.   History of iron deficiency anemia    started iron 07/02/19.  EGD/colonoscopy 08/27/19 unrevealing. Capsule endoscopy considered but never done.   Hyperlipidemia    Hypertension    Left bundle branch block (LBBB) 12/16/2016   Lung cancer ([place])    non-small cell lung ca, stage III in 05/2011; systemic chemotherapy concurrent with radiation followed by prophylactic cranial irradiation and has been observation since July of 2010 with no evidence for disease recurrence-released from onc f/u 12/2014 (needs annual cxr by PCP).  CXR  08/2015 stable.  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 Earlie Server did bx and this was NEG for malignancy.   Mild cognitive impairment with memory loss    Likely from brain radiation therapy   Non-obstructive CAD (coronary artery disease)    a. Cath 2006 preserved LV fxn, scattered irregularities without critical stenosis; b. 2008 stress echo negative for ischemia, but with hypertensive response; c. 08/2016 Cath: D1 25%, otw  nl.   Normocytic anemia 2018   Iron, vit B12, folate all normal 12/2016.   Open-angle glaucoma 2016   Dr. Shirley Muscat, (bilateral)---responding to topical therapy   Osteoarthritis of both hands    Osteoporosis 03/2016; 03/2018   03/2016 DEXA T-score -2.2.  03/2018 T-score L femoral neck  -2.7--boniva started. DEXA 04/2021 T scor -2.6->boniva continued. Plan rpt DEXA 2 yrs.   Pneumonia    hospitalization 11/2016; hypoxemic resp failure--d/c'd home with home oxygen therapy   Pulmonary nodule, left 08/2021   LUL 11 mm ground glass, needs f/u CT 08/2022.   Seasonal allergic rhinitis    Subclinical hyperthyroidism 2018   Takotsubo cardiomyopathy    a. 08/2016 Echo: EF 30-35%, gr1DD, PASP 65mHg;  b. 08/2016 Cath: nonobs Dzs;  c. 11/2016 Echo: EF 40-45%, no rwma, nl RV fxn, mild TR, PASP 364mg.  ECHO 02/2017 EF 50-55%, grd II DD---essentially resolved Takotsubo 02/2017.   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 08/2016 in setting of diverticulitis & pneumoperitoneum and Takotsubo CM -->prolonged QT, seen by EP-->avoid meds with potential for QT prolongation.   Past Surgical History:  Procedure Laterality Date   ABDOMINAL HYSTERECTOMY  1997   APPENDECTOMY     CARDIAC CATHETERIZATION N/A 09/14/2016   Procedure: Left Heart Cath and Coronary Angiography;  Surgeon: ThTroy SineMD;  [facility];  Service: Cardiovascular; Mild nonobstructive CAD with 85% smooth narrowing in the first diagonal branch of the LAD; 10% smooth narrowing of the ostial proximal left circumflex coronary artery; and a normal dominant RCA.   CARPAL TUNNEL RELEASE     CATARACT EXTRACTION Bilateral    CHOLECYSTECTOMY  2002   COLONOSCOPY  10/24/00; 08/27/19   2002 normal.  [employer] hemoccult testing via [facility] 06/18/15 was NEG.  2020->diverticulosis o/w nl.   dexa  03/2016; 03/2018   03/2018 T-score L femoral neck  -2.7 (worsened compared to osteopenic range 2017.) DEXA 05/03/21 stable T score -2.6 on boniva x 2 yrs->plan rpt DEXA  2 yrs.   ESOPHAGOGASTRODUODENOSCOPY  08/27/2019   Normal.   IR GENERIC HISTORICAL  09/19/2016   IR SINUS/FIST TUBE CHK-NON GI 09/19/2016 JaCorrie MckusickDO [facility]   IR GENERIC HISTORICAL  10/06/2016   IR RADIOLOGIST EVAL & MGMT 10/06/2016 [facility]   IR GENERIC HISTORICAL  10/26/2016   IR RADIOLOGIST EVAL & MGMT 10/26/2016 [facility]   IR GENERIC HISTORICAL  11/03/2016   IR RADIOLOGIST EVAL & MGMT 11/03/2016 WeArdis RowanPA-C [facility]   IR GENERIC HISTORICAL  11/24/2016   IR RADIOLOGIST EVAL & MGMT 11/24/2016 WeArdis RowanPA-C [facility]   IR GENERIC HISTORICAL  12/05/2016   Fistula smaller/improving.  IR SINUS/FIST TUBE CHK-NON GI 12/05/2016 JoSandi MariscalMD [facility]   IR GENERIC HISTORICAL  12/22/2016   IR RADIOLOGIST EVAL & MGMT 12/22/2016 MiGreggory KeenMD [facility]   IR RADIOLOGIST EVAL & MGMT  01/10/2017   IR RADIOLOGIST EVAL & MGMT  02/09/2017   IR SINUS/FIST TUBE CHK-NON GI  03/28/2017  LEFT HEART CATHETERIZATION WITH CORONARY ANGIOGRAM N/A 05/30/2012   Procedure: LEFT HEART CATHETERIZATION WITH CORONARY ANGIOGRAM;  Surgeon: Burnell Blanks, MD;  Location[facility];  Service: Cardiovascular: Mild, non-obstructive CAD   TRANSTHORACIC ECHOCARDIOGRAM  09/14/2016; 11/2016   08/2016: EF 30-35 %, Akinesis of the mid-apicalanteroseptal myocardium.  Grade I DD.  Mild pulm HTN.  Repeat echo 11/2016, EF 40-45%, no rwma, nl RV fxn, mild TR, PASP 5mHg.     TRANSTHORACIC ECHOCARDIOGRAM  03/07/2017   EF 50-55%. Hypokinesis of the distal septum with overall low normal LV,  systolic function; mild diastolic dysfunction with elevated LV  filling pressure; mildly calcified aortic valve with mild AI; small pericardial effusion   Social History:  reports that she quit smoking about 12 years ago. Her smoking use included cigarettes. She has a 45.00 pack-year smoking history. She has never used smokeless tobacco. She reports that she does  not drink alcohol and does not use drugs.  Allergies  Allergen Reactions   Lactose Intolerance (Gi) Diarrhea   Ibuprofen Other (See Comments)    REACTION: Anxiousness, hyperventilates    Family History  Problem Relation Age of Onset   Heart failure Mother    Kidney disease Mother        renal failure   Diabetes Mother    Diabetes Father    Diabetes Brother    Heart disease Brother    Heart attack Brother    Heart attack Brother    Heart attack Brother    Pancreatic cancer Brother    Colon cancer Neg Hx    Esophageal cancer Neg Hx    Stomach cancer Neg Hx    Rectal cancer Neg Hx     Prior to Admission medications   Medication Sig Start Date End Date Taking? Authorizing Provider  albuterol (PROVENTIL) (2.5 MG/3ML) 0.083% nebulizer solution USE 1 VIAL IN NEBULIZER EVERY 4 HOURS AS NEEDED FOR WHEEZING AND FOR SHORTNESS OF BREATH 02/12/21   McGowen, PAdrian Blackwater MD  albuterol (VENTOLIN HFA) 108 (90 Base) MCG/ACT inhaler INHALE 2 PUFFS BY MOUTH EVERY 6 HOURS AS NEEDED FOR WHEEZING OR SHORTNESS OF BREATH 06/05/20   McGowen, PAdrian Blackwater MD  aspirin 81 MG EC tablet Take 81 mg by mouth daily.     [provider]  atorvastatin (LIPITOR) 40 MG tablet Take 1 tablet by mouth once daily 09/20/21   McGowen, PAdrian Blackwater MD  buPROPion (WELLBUTRIN XL) 150 MG 24 hr tablet Take 1 tablet by mouth once daily 09/06/21   McGowen, PAdrian Blackwater MD  cetirizine (ZYRTEC) 10 MG tablet Take 10 mg by mouth daily.    [provider]  escitalopram (LEXAPRO) 20 MG tablet Take 1 tablet by mouth once daily 09/06/21   McGowen, PAdrian Blackwater MD  fluticasone (FLOVENT HFA) 220 MCG/ACT inhaler Inhale 2 puffs into the lungs 2 (two) times daily. 10/28/20   McGowen, PAdrian Blackwater MD  ibandronate (BONIVA) 150 MG tablet Take 1 tablet (150 mg total) by mouth every 30 (thirty) days. Take in the morning with a full glass of water, on an empty stomach, and do not take anything else by mouth or lie down for the next 30 min. 05/25/21    McGowen, PAdrian Blackwater MD  insulin isophane & regular human (HUMULIN 70/30 KWIKPEN) (70-30) 100 UNIT/ML KwikPen INJECT 4 UNITS SUBCUTANEOUSLY WITH BREAKFAST AND 2 UNITS WITH SUPPER 12/25/20   McGowen, PAdrian Blackwater MD  Lancets (ONETOUCH DELICA PLUS [id number] MISC USE 1  TO CHECK GLUCOSE THREE TIMES  DAILY 12/04/20   McGowen, [name], MD  metoprolol tartrate (LOPRESSOR) 25 MG tablet Take 1/2 (one-half) tablet by mouth twice daily 09/20/21   McGowen, [name], MD  Multiple Vitamins-Minerals (MULTIVITAMIN WOMEN PO) Take by mouth daily.    [provider]  [facility] ULTRA test strip USE 1 STRIP TO CHECK GLUCOSE THREE TIMES DAILY 05/19/21   McGowen, [name], MD  pantoprazole (PROTONIX) 40 MG tablet Take 1 tablet by mouth once daily 09/20/21   [name], MD    Physical Exam: Vitals:   10/19/21 1500 10/19/21 1643 10/19/21 1700 10/19/21 1742  BP: (!) 100/44 (!) 116/92 114/79 (!) 119/52  Pulse: 91 84 84 83  Resp: (!) 22 (!) 21 (!) 25 (!) 27  Temp:      TempSrc:      SpO2: 97% 98% 90% 100%   General appearance: Elderly adult female, lying in bed, no acute distress, appears tired coughing occasionally     HEENT: Anicteric, conjunctival pink, lids and lashes normal.  No nasal deformity, discharge, or epistaxis.  Lips tacky dry, dentition in good repair Skin: No suspicious rashes or lesions. Cardiac: RRR, no murmurs, no lower extremity edema. Respiratory: Tory and expiratory wheezes, respiratory effort without distress.  No rales. Abdomen: Abdomen soft no tenderness palpation or guarding, no ascites or distention  MSK:  Neuro: Awake and alert, extraocular movements intact, moves all extremities with generalized weakness but symmetric strength, speech fluent. Psych: Attention normal, affect normal, judgment insight appear slightly impaired by dementia, short-term memory most affected, oriented to daughter, [name], month.   Data Reviewed: Chest x-ray personally reviewed shows no focal  airspace disease or opacity.  CT and MRI brain showed no focal disease.  Sodium 129, glucose elevated, renal function normal, complete blood count normal.           Assessment/Plan * COVID-19 virus infection- (present on admission) No hypoxia or infiltrates on chest x-ray. - Start Paxlovid  Dehydration- (present on admission) Due to COVID -Gentle IV fluids overnight - PT eval  COPD exacerbation ([place])- (present on admission) Very wheezy on exam, likely triggered by COVID.  No hypoxia. - Start prednisone 5 days - Start scheduled bronchodilators  Hyponatremia- (present on admission) Sodium 129 - IV fluids and trend sodium  Essential hypertension- (present on admission) - Continue home metoprolol  Controlled type 2 diabetes mellitus with hyperglycemia, with long-term current use of insulin (HCC) A1c 6.9%, hyperglycemic here - Hold home 70/30 - Start linagliptin while in the hospital for COVID - Sliding scale correction insulin ordered  Pyuria- (present on admission) Asymptomatic, no treatment warranted.           Advance Care Planning: DNR, gust with daughter at the bedside       Consults: None needed  Family Communication: Daughter at the bedside  Severity of Illness: Observation. Patient has COVID-19 too weak to get up.  We will start Paxlovid, give some gentle IV fluids overnight, consult PT.  If she is able to ambulate with PT, she can conceivably go home with Paxlovid with her daughter tomorrow.    If she is too weak due to COVID and her COPD flare, and needs further rehab, will need to discuss with case management.  Author: Edwin Dada 10/19/2021 6:07 PM  For on call review [email or web address].

## 2021-10-19 NOTE — Assessment & Plan Note (Addendum)
Very wheezy on exam, likely triggered by COVID.  No hypoxia. - Start prednisone 5 days - Start scheduled bronchodilators

## 2021-10-19 NOTE — Assessment & Plan Note (Signed)
-   Continue home metoprolol

## 2021-10-19 NOTE — ED Notes (Signed)
Female purewick placed on pt

## 2021-10-19 NOTE — ED Provider Notes (Signed)
[place] DEPT Provider Note   CSN: [id number] Arrival date & time: 10/19/21  1202     History No chief complaint on file.   Lowana Hable is a 75 y.o. female.  HPI 75 year old female presents with weakness and difficulty walking.  History is from daughter and patient.  Patient has had trouble with getting up from a seated or lying position for many years.  Her daughter and her son help daily.  When she is up she is usually able to walk with a walker.  However starting this morning the patient was harder to get up out of her bed and then could barely walk.  She seemed to get a little bit better after eating but overall is much worse than typical.  No new medicines or acute illness.  Patient denies headache, new shortness of breath or fever, chest pain, or numbness.  However she tells me that she has been feeling weak on her right side and her right leg for a couple days.  Daughter did not know about this.  Denies arm weakness.  No back or neck pain.  She has a chronic cough.  Past Medical History:  Diagnosis Date   Age-related nuclear cataract of both eyes 2016   +cortical age related cataracts OU    Anxiety and depression    Bilateral diabetic retinopathy ([place]) 2015   Dr. 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   COPD (chronic obstructive pulmonary disease) (HCC)    CVA (cerebral vascular accident) ([place]) 09/2016   MRI did show a left-sided small ischemic stroke which was acute but likely incidental (MRI was done b/c pt had TIA sx's in hospital).  Neuro put her on plavix at that time.  Cardiology d/c'd her plavix 08/2017.   Diabetes mellitus with complication (HCC)    diab retinopathy OU (laser)   Finger fracture, right 04/2020   R 5th metacarpal fx at the base  (Dr. Mardelle Matte)   GERD (gastroesophageal reflux disease)    protonix   History of concussion 08/25/2018   w/out loss of consciousness--mild increase in baseline memory impairment after.  CT head neg acute.   History of diverticulitis of colon    with abscess; required IR percutaneous drain placed 09/2016.   History of iron deficiency anemia    started iron 07/02/19.  EGD/colonoscopy 08/27/19 unrevealing. Capsule endoscopy considered but never done.   Hyperlipidemia    Hypertension    Left bundle branch block (LBBB) 12/16/2016   Lung cancer ([place])    non-small cell lung ca, stage III in 05/2011; systemic chemotherapy concurrent with radiation followed by prophylactic cranial irradiation and has been observation since July of 2010 with no evidence for disease recurrence-released from onc f/u 12/2014 (needs annual cxr by PCP).  CXR 08/2015 stable.  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 Earlie Server did bx and this was NEG for malignancy.   Mild cognitive impairment with memory loss    Likely from brain radiation therapy   Non-obstructive CAD (coronary artery disease)    a. Cath 2006 preserved LV fxn, scattered irregularities without critical stenosis; b. 2008 stress echo negative for ischemia, but with hypertensive response; c. 08/2016 Cath: D1 25%, otw nl.   Normocytic anemia 2018   Iron, vit B12, folate all normal 12/2016.  Open-angle glaucoma 2016   Dr. Shirley Muscat, (bilateral)---responding to topical therapy   Osteoarthritis of both hands    Osteoporosis 03/2016; 03/2018   03/2016 DEXA T-score -2.2.  03/2018 T-score L femoral neck  -2.7--boniva started. DEXA 04/2021 T scor -2.6->boniva continued. Plan rpt DEXA 2 yrs.   Pneumonia    hospitalization 11/2016; hypoxemic resp failure--d/c'd home with home oxygen therapy   Pulmonary nodule, left 08/2021   LUL 11 mm ground glass, needs f/u CT 08/2022.   Seasonal allergic rhinitis    Subclinical hyperthyroidism 2018   Takotsubo cardiomyopathy    a. 08/2016 Echo:  EF 30-35%, gr1DD, PASP 25mHg;  b. 08/2016 Cath: nonobs Dzs;  c. 11/2016 Echo: EF 40-45%, no rwma, nl RV fxn, mild TR, PASP 365mg.  ECHO 02/2017 EF 50-55%, grd II DD---essentially resolved Takotsubo 02/2017.   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 08/2016 in setting of diverticulitis & pneumoperitoneum and Takotsubo CM -->prolonged QT, seen by EP-->avoid meds with potential for QT prolongation.    Patient Active Problem List   Diagnosis Date Noted   Acute renal failure ([facility]   Fall    Recurrent falls 10/08/2019   Hypocalcemia 10/08/2019   Preoperative cardiovascular examination 01/12/2017   Diarrhea 12/02/2016   CAP (community acquired pneumonia) 12/01/2016   Essential hypertension 12/01/2016   Chronic diastolic (congestive) heart failure ([facility]02/05/2017   Cerebrovascular accident (CVA) ([facility]   Hypertension associated with chronic kidney disease due to type 2 diabetes mellitus ([place]11/25/2017   Cardiac arrest with ventricular fibrillation ([facility]11/22/2017   Takotsubo cardiomyopathy 09/14/2016   Colonic diverticular abscess    Diverticulitis of large intestine with perforation and abscess 09/11/2016   Controlled type 2 diabetes mellitus with hyperglycemia, with long-term current use of insulin ([facility]08/22/2017   Hyperlipidemia 06/14/2016   COPD exacerbation ([facility] [place]12/26/2014   Non-occlusive coronary artery disease 06/06/2012   Allergic rhinitis 01/06/2011   TOBACCO USE, QUIT 07/29/2009   Uncontrolled type 2 diabetes mellitus with complication 0708/14/4818 GERD (gastroesophageal reflux disease) 04/24/2007    Past Surgical History:  Procedure Laterality Date   ABDOMINAL HYSTERECTOMY  1997   APPENDECTOMY     CARDIAC CATHETERIZATION N/A 09/14/2016   Procedure: Left Heart Cath and Coronary Angiography;  Surgeon: ThTroy SineMD;  [facility];  Service: Cardiovascular; Mild nonobstructive CAD with 85% smooth narrowing in the first diagonal branch of the LAD; 10% smooth  narrowing of the ostial proximal left circumflex coronary artery; and a normal dominant RCA.   CARPAL TUNNEL RELEASE     CATARACT EXTRACTION Bilateral    CHOLECYSTECTOMY  2002   COLONOSCOPY  10/24/00; 08/27/19   2002 normal.  [employer] hemoccult testing via [facility] 06/18/15 was NEG.  2020->diverticulosis o/w nl.   dexa  03/2016; 03/2018   03/2018 T-score L femoral neck  -2.7 (worsened compared to osteopenic range 2017.) DEXA 05/03/21 stable T score -2.6 on boniva x 2 yrs->plan rpt DEXA 2 yrs.   ESOPHAGOGASTRODUODENOSCOPY  08/27/2019   Normal.   IR GENERIC HISTORICAL  09/19/2016   IR SINUS/FIST TUBE CHK-NON GI 09/19/2016 JaCorrie MckusickDO [facility]   IR GENERIC HISTORICAL  10/06/2016   IR RADIOLOGIST EVAL & MGMT 10/06/2016 [facility]   IR GENERIC HISTORICAL  10/26/2016   IR RADIOLOGIST EVAL & MGMT 10/26/2016 [facility]   IR GENERIC HISTORICAL  11/03/2016   IR RADIOLOGIST EVAL & MGMT 11/03/2016 WeArdis RowanPA-C [facility] INTER[facility]   IR  GENERIC HISTORICAL  11/24/2016   IR RADIOLOGIST EVAL & MGMT 11/24/2016 Ardis Rowan, PA-C [facility]   IR GENERIC HISTORICAL  12/05/2016   Fistula smaller/improving.  IR SINUS/FIST TUBE CHK-NON GI 12/05/2016 Sandi Mariscal, MD [facility]   IR GENERIC HISTORICAL  12/22/2016   IR RADIOLOGIST EVAL & MGMT 12/22/2016 Greggory Keen, MD [facility]   IR RADIOLOGIST EVAL & MGMT  01/10/2017   IR RADIOLOGIST EVAL & MGMT  02/09/2017   IR SINUS/FIST TUBE CHK-NON GI  03/28/2017   LEFT HEART CATHETERIZATION WITH CORONARY ANGIOGRAM N/A 05/30/2012   Procedure: LEFT HEART CATHETERIZATION WITH CORONARY ANGIOGRAM;  Surgeon: Burnell Blanks, MD;  Location: [facility] CATH LAB;  Service: Cardiovascular: Mild, non-obstructive CAD   TRANSTHORACIC ECHOCARDIOGRAM  09/14/2016; 11/2016   08/2016: EF 30-35 %, Akinesis of the mid-apicalanteroseptal myocardium.  Grade I DD.  Mild pulm HTN.  Repeat echo 11/2016, EF 40-45%, no rwma, nl RV fxn, mild TR, PASP 65mHg.      TRANSTHORACIC ECHOCARDIOGRAM  03/07/2017   EF 50-55%. Hypokinesis of the distal septum with overall low normal LV,  systolic function; mild diastolic dysfunction with elevated LV  filling pressure; mildly calcified aortic valve with mild AI; small pericardial effusion     OB History   No obstetric history on file.     Family History  Problem Relation Age of Onset   Heart failure Mother    Kidney disease Mother        renal failure   Diabetes Mother    Diabetes Father    Diabetes Brother    Heart disease Brother    Heart attack Brother    Heart attack Brother    Heart attack Brother    Pancreatic cancer Brother    Colon cancer Neg Hx    Esophageal cancer Neg Hx    Stomach cancer Neg Hx    Rectal cancer Neg Hx     Social History   Tobacco Use   Smoking status: Former    Packs/day: 1.00    Years: 45.00    Pack years: 45.00    Types: Cigarettes    Quit date: 10/24/2008    Years since quitting: 12.9   Smokeless tobacco: Never  Vaping Use   Vaping Use: Never used  Substance Use Topics   Alcohol use: No    Alcohol/week: 0.0 standard drinks   Drug use: No    Home Medications Prior to Admission medications   Medication Sig Start Date End Date Taking? Authorizing Provider  albuterol (PROVENTIL) (2.5 MG/3ML) 0.083% nebulizer solution USE 1 VIAL IN NEBULIZER EVERY 4 HOURS AS NEEDED FOR WHEEZING AND FOR SHORTNESS OF BREATH 02/12/21   McGowen, PAdrian Blackwater MD  albuterol (VENTOLIN HFA) 108 (90 Base) MCG/ACT inhaler INHALE 2 PUFFS BY MOUTH EVERY 6 HOURS AS NEEDED FOR WHEEZING OR SHORTNESS OF BREATH 06/05/20   McGowen, PAdrian Blackwater MD  aspirin 81 MG EC tablet Take 81 mg by mouth daily.     [provider]  atorvastatin (LIPITOR) 40 MG tablet Take 1 tablet by mouth once daily 09/20/21   McGowen, PAdrian Blackwater MD  buPROPion (WELLBUTRIN XL) 150 MG 24 hr tablet Take 1 tablet by mouth once daily 09/06/21   McGowen, PAdrian Blackwater MD  cetirizine (ZYRTEC) 10 MG tablet Take 10 mg by mouth  daily.    [provider]  escitalopram (LEXAPRO) 20 MG tablet Take 1 tablet by mouth once daily 09/06/21   McGowen, PAdrian Blackwater MD  fluticasone (  FLOVENT HFA) 220 MCG/ACT inhaler Inhale 2 puffs into the lungs 2 (two) times daily. 10/28/20   McGowen, [name], MD  ibandronate (BONIVA) 150 MG tablet Take 1 tablet (150 mg total) by mouth every 30 (thirty) days. Take in the morning with a full glass of water, on an empty stomach, and do not take anything else by mouth or lie down for the next 30 min. 05/25/21   McGowen, [name], MD  insulin isophane & regular human (HUMULIN 70/30 KWIKPEN) (70-30) 100 UNIT/ML KwikPen INJECT 4 UNITS SUBCUTANEOUSLY WITH BREAKFAST AND 2 UNITS WITH SUPPER 12/25/20   McGowen, [name], MD  Lancets (ONETOUCH DELICA PLUS [id number]) MISC USE 1  TO CHECK GLUCOSE THREE TIMES DAILY 12/04/20   McGowen, [name], MD  metoprolol tartrate (LOPRESSOR) 25 MG tablet Take 1/2 (one-half) tablet by mouth twice daily 09/20/21   McGowen, [name], MD  Multiple Vitamins-Minerals (MULTIVITAMIN WOMEN PO) Take by mouth daily.    [provider]  [facility] ULTRA test strip USE 1 STRIP TO CHECK GLUCOSE THREE TIMES DAILY 05/19/21   McGowen, [name], MD  pantoprazole (PROTONIX) 40 MG tablet Take 1 tablet by mouth once daily 09/20/21   McGowen, [name], MD    Allergies    Lactose intolerance (gi) and Ibuprofen  Review of Systems   Review of Systems  Constitutional:  Positive for fatigue. Negative for fever.  Respiratory:  Positive for cough. Negative for shortness of breath.   Cardiovascular:  Negative for chest pain.  Gastrointestinal:  Negative for abdominal pain, diarrhea and vomiting.  Genitourinary:  Negative for dysuria.  Musculoskeletal:  Negative for back pain and neck pain.  Neurological:  Positive for weakness. Negative for headaches.  All other systems reviewed and are negative.  Physical Exam Updated Vital Signs BP (!) 100/44    Pulse 91    Temp 98.7 F (37.1 C) (Oral)     Resp (!) 22    SpO2 97%   Physical Exam Vitals and nursing note reviewed.  Constitutional:      Appearance: She is well-developed.  HENT:     Head: Normocephalic and atraumatic.     Right Ear: External ear normal.     Left Ear: External ear normal.     Nose: Nose normal.  Eyes:     General:        Right eye: No discharge.        Left eye: No discharge.     Extraocular Movements: Extraocular movements intact.  Cardiovascular:     Rate and Rhythm: Regular rhythm. Tachycardia present.     Heart sounds: Normal heart sounds.     Comments: HR low 100s Pulmonary:     Effort: Pulmonary effort is normal.     Breath sounds: Normal breath sounds.  Abdominal:     General: There is no distension.     Palpations: Abdomen is soft.     Tenderness: There is no abdominal tenderness.  Skin:    General: Skin is warm and dry.  Neurological:     Mental Status: She is alert.     Comments: 5/5 strength in both upper extremities and left lower extremity.  However she has 3/5 strength in the right lower extremity.  Psychiatric:        Mood and Affect: Mood is not anxious.    ED Results / Procedures / Treatments   Labs (all labs ordered are listed, but only abnormal results are displayed) Labs Reviewed  BASIC METABOLIC  PANEL - Abnormal; Notable for the following components:      Result Value   Sodium 129 (*)    Chloride 97 (*)    Glucose, Bld 281 (*)    Creatinine, Ser 1.11 (*)    Calcium 8.8 (*)    GFR, Estimated 52 (*)    All other components within normal limits  CBC - Abnormal; Notable for the following components:   Hemoglobin 11.9 (*)    All other components within normal limits  URINALYSIS, ROUTINE W REFLEX MICROSCOPIC - Abnormal; Notable for the following components:   APPearance HAZY (*)    Glucose, UA 50 (*)    Leukocytes,Ua LARGE (*)    WBC, UA >50 (*)    Bacteria, UA RARE (*)    All other components within normal limits  RESP PANEL BY RT-PCR (FLU A&B, COVID) [id number]   URINE CULTURE    EKG EKG Interpretation  Date/Time:  Tuesday October 19 2021 12:33:27 EST Ventricular Rate:  99 PR Interval:  123 QRS Duration: 126 QT Interval:  399 QTC Calculation: 513 R Axis:   61 Text Interpretation: Sinus rhythm Left bundle branch block similar to Aug 2022 Confirmed by Sherwood Gambler [phone]) on 10/19/2021 12:51:15 PM  Radiology DG Chest 2 View  Result Date: 10/19/2021 CLINICAL DATA:  Cough weakness. EXAM: CHEST - 2 VIEW COMPARISON:  July 20, 2021 and September 08, 2021. FINDINGS: Paramediastinal radiotherapy changes in the RIGHT upper lobe with resultant volume loss/consolidation similar to previous imaging, scaring the upper margin of the RIGHT mediastinal border. Nodular density at the LEFT lung apex seen on previous radiograph is no longer seen. Cardiomediastinal contours and hilar structures are unchanged, mild cardiac enlargement and some elevation of the RIGHT hemidiaphragm as before. No sign of pleural effusion. EKG leads project over the patient's chest. On limited assessment there is no acute skeletal process. IMPRESSION: Paramediastinal radiotherapy changes in the RIGHT upper lobe with resultant volume loss/consolidation similar to previous imaging. No acute cardiopulmonary disease. Electronically Signed   By: Zetta Bills M.D.   On: 10/19/2021 13:34   CT HEAD WO CONTRAST (5MM)  Result Date: 10/19/2021 CLINICAL DATA:  Weakness EXAM: CT HEAD WITHOUT CONTRAST TECHNIQUE: Contiguous axial images were obtained from the base of the skull through the vertex without intravenous contrast. COMPARISON:  05/28/2021 and previous FINDINGS: Brain: Diffuse parenchymal atrophy. Patchy areas of hypoattenuation in deep and periventricular white matter bilaterally. Negative for acute intracranial hemorrhage, mass lesion, acute infarction, midline shift, or mass-effect. Acute infarct may be inapparent on noncontrast CT. Ventricles and sulci symmetric. Vascular: No  hyperdense vessel or unexpected calcification. Skull: Normal. Negative for fracture or focal lesion. Sinuses/Orbits: Hypoplastic frontal sinuses.  Otherwise negative. Other: None IMPRESSION: 1. Negative for bleed or other acute intracranial process. 2. Atrophy and nonspecific white matter changes. Electronically Signed   By: Lucrezia Europe M.D.   On: 10/19/2021 13:55    Procedures Procedures   Medications Ordered in ED Medications  lactated ringers bolus 500 mL (0 mLs Intravenous Stopped 10/19/21 1354)    ED Course  I have reviewed the triage vital signs and the nursing notes.  Pertinent labs & imaging results that were available during my care of the patient were reviewed by me and considered in my medical decision making (see chart for details).    MDM Rules/Calculators/A&P                         No obvious emergencies in  her blood work to explain why she is acutely weak.  It seems to be right leg weakness more than anything else.  Given this an MRI has been ordered due to concern for subacute stroke (last known well was last night).  Otherwise there are many leukocytes in her urinalysis though no UTI symptoms.  May be worth treating and I will send for a culture. Care to Dr. Doren Custard with MRI pending.     Final Clinical Impression(s) / ED Diagnoses Final diagnoses:  None    Rx / DC Orders ED Discharge Orders     None        Sherwood Gambler, MD 10/19/21 1652

## 2021-10-19 NOTE — Assessment & Plan Note (Signed)
No hypoxia or infiltrates on chest x-ray. - Start Paxlovid

## 2021-10-19 NOTE — Assessment & Plan Note (Signed)
Asymptomatic, no treatment warranted.

## 2021-10-19 NOTE — Assessment & Plan Note (Addendum)
A1c 6.9%, hyperglycemic here - Hold home 70/30 - Start linagliptin while in the hospital for COVID - Sliding scale correction insulin ordered

## 2021-10-19 NOTE — ED Triage Notes (Signed)
Per family/patient-states she woke up this morning weak-states she has not been sick-complaining of some discomfort in both knees-PCP told them to come to ED

## 2021-10-19 NOTE — ED Notes (Addendum)
Trial ambulation w/ pt. Pt unable to stand and remain standing. Pt continues to fall back into bed.  Pt unable to bear full body weight. Pt normally uses walker at home, pt unable to remain standing w/ walker assistance and ed RN assistance.  Pt's daughter at bedside, witness to ambulation attempt. Daughter states pt is weaker at present than normal baseline at home. MD Regenia Skeeter notified and aware.

## 2021-10-19 NOTE — Assessment & Plan Note (Signed)
Sodium 129 - IV fluids and trend sodium

## 2021-10-19 NOTE — Assessment & Plan Note (Signed)
Due to COVID -Gentle IV fluids overnight - PT eval

## 2021-10-20 ENCOUNTER — Other Ambulatory Visit: Payer: Self-pay

## 2021-10-20 DIAGNOSIS — U071 COVID-19: Secondary | ICD-10-CM | POA: Diagnosis not present

## 2021-10-20 LAB — BASIC METABOLIC PANEL
Anion gap: 7 (ref 5–15)
Anion gap: 8 (ref 5–15)
BUN: 16 mg/dL (ref 8–23)
BUN: 16 mg/dL (ref 8–23)
CO2: 25 mmol/L (ref 22–32)
CO2: 28 mmol/L (ref 22–32)
Calcium: 8.6 mg/dL — ABNORMAL LOW (ref 8.9–10.3)
Calcium: 8.5 mg/dL — ABNORMAL LOW (ref 8.9–10.3)
Chloride: 99 mmol/L (ref 98–111)
Chloride: 93 mmol/L — ABNORMAL LOW (ref 98–111)
Creatinine, Ser: 0.88 mg/dL (ref 0.44–1.00)
Creatinine, Ser: 0.76 mg/dL (ref 0.44–1.00)
GFR, Estimated: >60 mL/min (ref >60)
GFR, Estimated: >60 mL/min (ref >60)
Glucose, Bld: 96 mg/dL (ref 70–99)
Glucose, Bld: 195 mg/dL — ABNORMAL HIGH (ref 70–99)
Potassium: 3.9 mmol/L (ref 3.5–5.1)
Potassium: 3.9 mmol/L (ref 3.5–5.1)
Sodium: 131 mmol/L — ABNORMAL LOW (ref 135–145)
Sodium: 129 mmol/L — ABNORMAL LOW (ref 135–145)

## 2021-10-20 LAB — GLUCOSE, CAPILLARY
Glucose-Capillary: 103 mg/dL — ABNORMAL HIGH (ref 70–99)
Glucose-Capillary: 121 mg/dL — ABNORMAL HIGH (ref 70–99)
Glucose-Capillary: 186 mg/dL — ABNORMAL HIGH (ref 70–99)
Glucose-Capillary: 233 mg/dL — ABNORMAL HIGH (ref 70–99)

## 2021-10-20 MED ORDER — BENZONATATE 100 MG PO CAPS
100.0000 mg | ORAL_CAPSULE | Freq: Three times a day (TID) | ORAL | Status: DC | PRN
  Administered 2021-10-20 – 2021-10-21 (×2): 100 mg via ORAL
  Filled 2021-10-20 (×2): qty 1

## 2021-10-20 NOTE — TOC Initial Note (Signed)
Transition of Care [facility]) - Initial/Assessment Note    Patient Details  Name: [name] MRN: [id number] Date of Birth: 1946-08-08  Transition of Care [facility]) CM/SW Contact:    Leeroy Cha, RN Phone Number: 10/20/2021, 7:58 AM  Clinical Narrative:                  Transition of Care [facility]) Screening Note   Patient Details  Name: [name] Date of Birth: 1946-10-10   Transition of Care [facility]) CM/SW Contact:    Leeroy Cha, RN Phone Number: 10/20/2021, 7:58 AM    Transition of Care Department [facility]) has reviewed patient and no TOC needs have been identified at this time. We will continue to monitor patient advancement through interdisciplinary progression rounds. If new patient transition needs arise, please place a TOC consult.    Expected Discharge Plan: Home/Self Care Barriers to Discharge: Continued Medical Work up   Patient Goals and CMS Choice Patient states their goals for this hospitalization and ongoing recovery are:: to go back home CMS Medicare.gov Compare Post Acute Care list provided to:: Patient    Expected Discharge Plan and Services Expected Discharge Plan: Home/Self Care   Discharge Planning Services: CM Consult   Living arrangements for the past 2 months: Single Family Home                                      Prior Living Arrangements/Services Living arrangements for the past 2 months: Single Family Home Lives with:: Self Patient language and need for interpreter reviewed:: Yes              Criminal Activity/Legal Involvement Pertinent to Current Situation/Hospitalization: No - Comment as needed  Activities of Daily Living Home Assistive Devices/Equipment: Walker (specify type) ADL Screening (condition at time of admission) Patient's cognitive ability adequate to safely complete daily activities?: Yes Is the patient deaf or have difficulty hearing?: Yes Does the patient have difficulty seeing, even  when wearing glasses/contacts?: No Does the patient have difficulty concentrating, remembering, or making decisions?: No Patient able to express need for assistance with ADLs?: Yes Does the patient have difficulty dressing or bathing?: Yes Independently performs ADLs?: No Communication: Independent Dressing (OT): Independent Grooming: Independent Feeding: Independent Bathing: Independent Toileting: Needs assistance Is this a change from baseline?: Pre-admission baseline In/Out Bed: Needs assistance Is this a change from baseline?: Pre-admission baseline Walks in Home: Needs assistance Is this a change from baseline?: Pre-admission baseline Does the patient have difficulty walking or climbing stairs?: Yes Weakness of Legs: Both Weakness of Arms/Hands: Both  Permission Sought/Granted                  Emotional Assessment Appearance:: Appears stated age Attitude/Demeanor/Rapport: Engaged Affect (typically observed): Calm Orientation: : Oriented to Place, Oriented to Self, Oriented to  Time, Oriented to Situation Alcohol / Substance Use: Not Applicable Psych Involvement: No (comment)  Admission diagnosis:  Dehydration [E86.0] Patient Active Problem List   Diagnosis Date Noted   Dehydration 10/19/2021   COVID-19 virus infection 10/19/2021   Hyponatremia 10/19/2021   Dementia without behavioral disturbance ([place]) 10/19/2021   Pyuria 10/19/2021   Fall    Recurrent falls 10/08/2019   Hypocalcemia 10/08/2019   Preoperative cardiovascular examination 01/12/2017   Diarrhea 12/02/2016   Essential hypertension 12/01/2016   Chronic diastolic (congestive) heart failure ([place]) 12/01/2016   Hypertension associated with chronic kidney disease  due to type 2 diabetes mellitus ([place]) 09/17/2016   Takotsubo cardiomyopathy 09/14/2016   Controlled type 2 diabetes mellitus with hyperglycemia, with long-term current use of insulin ([place]) 06/14/2016   Hyperlipidemia 06/14/2016   COPD  exacerbation ([place]) 10/18/2013   Non-occlusive coronary artery disease 06/06/2012   Allergic rhinitis 01/06/2011   TOBACCO USE, QUIT 07/29/2009   Uncontrolled type 2 diabetes mellitus with complication 29/24/4628   GERD (gastroesophageal reflux disease) 04/24/2007   PCP:  [name], MD Pharmacy:   [facility], [address] [place]. [place] [address] Phone: [phone] Fax: [phone]     Social Determinants of Health (SDOH) Interventions    Readmission Risk Interventions No flowsheet data found.

## 2021-10-20 NOTE — Progress Notes (Signed)
History and Physical    Patient: [name] HDQ:[id number] DOB: 1946/02/02 DOA: 10/19/2021 DOS: the patient was seen and examined on 10/20/2021 PCP: [name], MD  Patient coming from: Home  Chief Complaint: Weakness/fatigue  HPI:  [name] is a 75 y.o. F with mild dementia lives with daughter, COPD not on O2, hx NSCLC s/p chemo-rad no surgery, diverticular abscess resolved, Takotsubo CM resolved, HTN and DM who presented with weakness for 1 day.  On the day of admission, she was noted to be too weak to get up at all or stand.  Maybe she had reduced appetite and cough, although close to baseline.   No fever, confusion, respiratory distress, dysuria, urgency, urinary frequency.  In the ER, vital signs unremarkable but patient unable to stand.  Mild hyponatremia, no hypoxia, chest x-ray without opacities.  COVID-positive.     History collected from the daughter at the bedside as the patient has had whole brain radiation and is forgetful.  EDP thought the patient might have stroke and ordered MRI which was normal.  EDP thought patient might have UTI and ordered Rocephin.  10/20/2021: Patient was seen and examined at bedside.  She has no new complaints today.  Seen by PT with recommendation for home health PT. serum sodium uptrending this morning 131 from 129.   Review of Systems: Review of Systems  Constitutional:  Positive for malaise/fatigue. Negative for chills and fever.  Respiratory:  Positive for cough and wheezing. Negative for sputum production and shortness of breath.   Genitourinary:  Negative for dysuria, flank pain, frequency, hematuria and urgency.  Neurological:  Positive for weakness. Negative for sensory change, speech change, focal weakness and loss of consciousness.     Past Medical History:  Diagnosis Date   Acute renal failure (HCC)    Age-related nuclear cataract of both eyes 2016   +cortical age related cataracts OU    Anxiety and  depression    Bilateral diabetic retinopathy ([place]) 2015   Dr. 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   Colonic diverticular abscess    COPD (chronic obstructive pulmonary disease) (HCC)    CVA (cerebral vascular accident) ([place]) 09/2016   MRI did show a left-sided small ischemic stroke which was acute but likely incidental (MRI was done b/c pt had TIA sx's in hospital).  Neuro put her on plavix at that time.  Cardiology d/c'd her plavix 08/2017.   Diabetes mellitus with complication (HCC)    diab retinopathy OU (laser)   Diverticulitis of large intestine with perforation and abscess 09/11/2016   Finger fracture, right 04/2020   R 5th metacarpal fx at the base (Dr. Mardelle Matte)   GERD (gastroesophageal reflux disease)    protonix   History of concussion 08/25/2018   w/out loss of consciousness--mild increase in baseline memory impairment after.  CT head neg acute.   History of diverticulitis of colon    with abscess; required IR percutaneous drain placed 09/2016.   History of iron deficiency anemia    started iron 07/02/19.  EGD/colonoscopy 08/27/19 unrevealing. Capsule endoscopy considered but never done.   Hyperlipidemia    Hypertension    Left bundle branch block (LBBB) 12/16/2016   Lung cancer ([place])    non-small cell lung ca, stage III in 05/2011; systemic chemotherapy concurrent  with radiation followed by prophylactic cranial irradiation and has been observation since July of 2010 with no evidence for disease recurrence-released from onc f/u 12/2014 (needs annual cxr by PCP).  CXR 08/2015 stable.  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 Earlie Server did bx and this was NEG for malignancy.   Mild cognitive impairment with memory loss    Likely from brain radiation therapy   Non-obstructive CAD (coronary artery  disease)    a. Cath 2006 preserved LV fxn, scattered irregularities without critical stenosis; b. 2008 stress echo negative for ischemia, but with hypertensive response; c. 08/2016 Cath: D1 25%, otw nl.   Normocytic anemia 2018   Iron, vit B12, folate all normal 12/2016.   Open-angle glaucoma 2016   Dr. Shirley Muscat, (bilateral)---responding to topical therapy   Osteoarthritis of both hands    Osteoporosis 03/2016; 03/2018   03/2016 DEXA T-score -2.2.  03/2018 T-score L femoral neck  -2.7--boniva started. DEXA 04/2021 T scor -2.6->boniva continued. Plan rpt DEXA 2 yrs.   Pneumonia    hospitalization 11/2016; hypoxemic resp failure--d/c'd home with home oxygen therapy   Pulmonary nodule, left 08/2021   LUL 11 mm ground glass, needs f/u CT 08/2022.   Seasonal allergic rhinitis    Subclinical hyperthyroidism 2018   Takotsubo cardiomyopathy    a. 08/2016 Echo: EF 30-35%, gr1DD, PASP 45mmHg;  b. 08/2016 Cath: nonobs Dzs;  c. 11/2016 Echo: EF 40-45%, no rwma, nl RV fxn, mild TR, PASP 52mmHg.  ECHO 02/2017 EF 50-55%, grd II DD---essentially resolved Takotsubo 02/2017.   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 08/2016 in setting of diverticulitis & pneumoperitoneum and Takotsubo CM -->prolonged QT, seen by EP-->avoid meds with potential for QT prolongation.   Past Surgical History:  Procedure Laterality Date   ABDOMINAL HYSTERECTOMY  1997   APPENDECTOMY     CARDIAC CATHETERIZATION N/A 09/14/2016   Procedure: Left Heart Cath and Coronary Angiography;  Surgeon: Troy Sine, MD;  Location: [facility];  Service: Cardiovascular; Mild nonobstructive CAD with 85% smooth narrowing in the first diagonal branch of the LAD; 10% smooth narrowing of the ostial proximal left circumflex coronary artery; and a normal dominant RCA.   CARPAL TUNNEL RELEASE     CATARACT EXTRACTION Bilateral    CHOLECYSTECTOMY  2002   COLONOSCOPY  10/24/00; 08/27/19   2002 normal.  [employer] hemoccult testing via [facility] 06/18/15 was NEG.   2020->diverticulosis o/w nl.   dexa  03/2016; 03/2018   03/2018 T-score L femoral neck  -2.7 (worsened compared to osteopenic range 2017.) DEXA 05/03/21 stable T score -2.6 on boniva x 2 yrs->plan rpt DEXA 2 yrs.   ESOPHAGOGASTRODUODENOSCOPY  08/27/2019   Normal.   IR GENERIC HISTORICAL  09/19/2016   IR SINUS/FIST TUBE CHK-NON GI 09/19/2016 Corrie Mckusick, DO [facility]   IR GENERIC HISTORICAL  10/06/2016   IR RADIOLOGIST EVAL & MGMT 10/06/2016 [facility]   IR GENERIC HISTORICAL  10/26/2016   IR RADIOLOGIST EVAL & MGMT 10/26/2016 [facility]   IR GENERIC HISTORICAL  11/03/2016   IR RADIOLOGIST EVAL & MGMT 11/03/2016 Ardis Rowan, PA-C [facility]   IR GENERIC HISTORICAL  11/24/2016   IR RADIOLOGIST EVAL & MGMT 11/24/2016 Ardis Rowan, PA-C [facility]   IR GENERIC HISTORICAL  12/05/2016   Fistula smaller/improving.  IR SINUS/FIST TUBE CHK-NON GI 12/05/2016 Sandi Mariscal, MD [facility]   IR GENERIC HISTORICAL  12/22/2016   IR RADIOLOGIST EVAL & MGMT 12/22/2016 Greggory Keen,  MD [facility]   IR RADIOLOGIST EVAL & MGMT  01/10/2017   IR RADIOLOGIST EVAL & MGMT  02/09/2017   IR SINUS/FIST TUBE CHK-NON GI  03/28/2017   LEFT HEART CATHETERIZATION WITH CORONARY ANGIOGRAM N/A 05/30/2012   Procedure: LEFT HEART CATHETERIZATION WITH CORONARY ANGIOGRAM;  Surgeon: Burnell Blanks, MD;  Location: [facility];  Service: Cardiovascular: Mild, non-obstructive CAD   TRANSTHORACIC ECHOCARDIOGRAM  09/14/2016; 11/2016   08/2016: EF 30-35 %, Akinesis of the mid-apicalanteroseptal myocardium.  Grade I DD.  Mild pulm HTN.  Repeat echo 11/2016, EF 40-45%, no rwma, nl RV fxn, mild TR, PASP 55mHg.     TRANSTHORACIC ECHOCARDIOGRAM  03/07/2017   EF 50-55%. Hypokinesis of the distal septum with overall low normal LV,  systolic function; mild diastolic dysfunction with elevated LV  filling pressure; mildly calcified aortic valve with mild AI; small pericardial effusion   Social  History:  reports that she quit smoking about 12 years ago. Her smoking use included cigarettes. She has a 45.00 pack-year smoking history. She has never used smokeless tobacco. She reports that she does not drink alcohol and does not use drugs.  Allergies  Allergen Reactions   Lactose Intolerance (Gi) Diarrhea   Ibuprofen Other (See Comments)    REACTION: Anxiousness, hyperventilates    Family History  Problem Relation Age of Onset   Heart failure Mother    Kidney disease Mother        renal failure   Diabetes Mother    Diabetes Father    Diabetes Brother    Heart disease Brother    Heart attack Brother    Heart attack Brother    Heart attack Brother    Pancreatic cancer Brother    Colon cancer Neg Hx    Esophageal cancer Neg Hx    Stomach cancer Neg Hx    Rectal cancer Neg Hx     Prior to Admission medications   Medication Sig Start Date End Date Taking? Authorizing Provider  albuterol (PROVENTIL) (2.5 MG/3ML) 0.083% nebulizer solution USE 1 VIAL IN NEBULIZER EVERY 4 HOURS AS NEEDED FOR WHEEZING AND FOR SHORTNESS OF BREATH 02/12/21   McGowen, PAdrian Blackwater MD  albuterol (VENTOLIN HFA) 108 (90 Base) MCG/ACT inhaler INHALE 2 PUFFS BY MOUTH EVERY 6 HOURS AS NEEDED FOR WHEEZING OR SHORTNESS OF BREATH 06/05/20   McGowen, PAdrian Blackwater MD  aspirin 81 MG EC tablet Take 81 mg by mouth daily.     [provider]  atorvastatin (LIPITOR) 40 MG tablet Take 1 tablet by mouth once daily 09/20/21   McGowen, PAdrian Blackwater MD  buPROPion (WELLBUTRIN XL) 150 MG 24 hr tablet Take 1 tablet by mouth once daily 09/06/21   McGowen, PAdrian Blackwater MD  cetirizine (ZYRTEC) 10 MG tablet Take 10 mg by mouth daily.    [provider]  escitalopram (LEXAPRO) 20 MG tablet Take 1 tablet by mouth once daily 09/06/21   McGowen, PAdrian Blackwater MD  fluticasone (FLOVENT HFA) 220 MCG/ACT inhaler Inhale 2 puffs into the lungs 2 (two) times daily. 10/28/20   McGowen, PAdrian Blackwater MD  ibandronate (BONIVA) 150 MG tablet Take 1  tablet (150 mg total) by mouth every 30 (thirty) days. Take in the morning with a full glass of water, on an empty stomach, and do not take anything else by mouth or lie down for the next 30 min. 05/25/21   McGowen, PAdrian Blackwater MD  insulin isophane & regular human (HUMULIN 70/30 KWIKPEN) (70-30) 100 UNIT/ML KwikPen  INJECT 4 UNITS SUBCUTANEOUSLY WITH BREAKFAST AND 2 UNITS WITH SUPPER 12/25/20   McGowen, [name], MD  Lancets (ONETOUCH DELICA PLUS [id number]) MISC USE 1  TO CHECK GLUCOSE THREE TIMES DAILY 12/04/20   McGowen, [name], MD  metoprolol tartrate (LOPRESSOR) 25 MG tablet Take 1/2 (one-half) tablet by mouth twice daily 09/20/21   McGowen, [name], MD  Multiple Vitamins-Minerals (MULTIVITAMIN WOMEN PO) Take by mouth daily.    [provider]  [facility] ULTRA test strip USE 1 STRIP TO CHECK GLUCOSE THREE TIMES DAILY 05/19/21   McGowen, [name], MD  pantoprazole (PROTONIX) 40 MG tablet Take 1 tablet by mouth once daily 09/20/21   [name], MD    Physical Exam: Vitals:   10/20/21 0300 10/20/21 0657 10/20/21 0841 10/20/21 0913  BP: (!) 118/45 (!) 118/49 (!) 121/52   Pulse: 80 85 82   Resp: (!) _0 Temp: 99 F (37.2 C) 97.7 F (36.5 C) 98 F (36.7 C)   TempSrc: Oral Oral Oral   SpO2: 97% 97% 98% 99%  Weight:       General appearance: Elderly adult female, lying in bed, no acute distress, appears tired coughing occasionally     HEENT: Anicteric, conjunctival pink, lids and lashes normal.  No nasal deformity, discharge, or epistaxis.  Lips tacky dry, dentition in good repair Skin: No suspicious rashes or lesions. Cardiac: RRR, no murmurs, no lower extremity edema. Respiratory: Tory and expiratory wheezes, respiratory effort without distress.  No rales. Abdomen: Abdomen soft no tenderness palpation or guarding, no ascites or distention  MSK:  Neuro: Awake and alert, extraocular movements intact, moves all extremities with generalized weakness but symmetric strength,  speech fluent. Psych: Attention normal, affect normal, judgment insight appear slightly impaired by dementia, short-term memory most affected, oriented to daughter, Elvina Sidle, month.   Data Reviewed: Chest x-ray personally reviewed shows no focal airspace disease or opacity.  CT and MRI brain showed no focal disease.  Sodium 129, glucose elevated, renal function normal, complete blood count normal.           Assessment/Plan * COVID-19 virus infection- (present on admission) No hypoxia or infiltrates on chest x-ray. - Start Paxlovid  Pyuria- (present on admission) Asymptomatic, no treatment warranted.  Hyponatremia- (present on admission) Sodium 129 - IV fluids and trend sodium  Dehydration- (present on admission) Due to COVID -Gentle IV fluids overnight - PT eval  Essential hypertension- (present on admission) - Continue home metoprolol  Controlled type 2 diabetes mellitus with hyperglycemia, with long-term current use of insulin (HCC) A1c 6.9%, hyperglycemic here - Hold home 70/30 - Start linagliptin while in the hospital for COVID - Sliding scale correction insulin ordered  COPD exacerbation ([place])- (present on admission) Very wheezy on exam, likely triggered by COVID.  No hypoxia. - Start prednisone 5 days - Start scheduled bronchodilators   Continue to encourage oral intake.  Serum sodium is uptrending. Continue antiviral Paxlovid.  Plan for DC on 10/21/2021.        Advance Care Planning: DNR, gust with daughter at the bedside       Consults: None needed  Family Communication: Daughter at the bedside  Severity of Illness: Observation. Patient has COVID-19 too weak to get up.  We will start Paxlovid, give some gentle IV fluids overnight, consult PT.  If she is able to ambulate with PT, she can conceivably go home with Paxlovid with her daughter tomorrow.    If she is too  weak due to COVID and her COPD flare, and needs further rehab, will  need to discuss with case management.  Author: Kayleen Memos 10/20/2021 2:52 PM  For on call review [email or web address].

## 2021-10-20 NOTE — Plan of Care (Signed)
°  Problem: Education: Goal: Knowledge of risk factors and measures for prevention of condition will improve Outcome: Not Progressing  Patient noted forgetful at time. Education needs to be reviewed.

## 2021-10-20 NOTE — Evaluation (Addendum)
Physical Therapy Evaluation Patient Details Name: [name] MRN: [id number] DOB: 1946-03-30 Today's Date: 10/20/2021  History of Present Illness  [name] is a 75 y.o. F with mild dementia lives with daughter, COPD not on O2, hx NSCLC s/p chemo-rad no surgery, diverticular abscess resolved, Takotsubo CM resolved, HTN and DM who presented 10/21/21. with weakness for 1 day. Tested positve for Covid.  Clinical Impression    The patient is pleasant, appears near cognitive baseline. Patient able to follow directions, oriented to hospital and has covid. No family present  for confirmation re:  24/7 caregiver availability.  Pt reports daughter works for school system, that 57 YO gr.son is home and assists her. If  patient has support , could return home with HHPT vs need for SNF if has no 24/7 support.   Patient  assisted to recliner with mod assist. Should be able to progress ambulation. SPO2 94% on RA. Instructed and patient performed IS. Pt admitted with above diagnosis.   Pt currently with functional limitations due to the deficits listed below (see PT Problem List). Pt will benefit from skilled PT to increase their independence and safety with mobility to allow discharge to the venue listed below.        Recommendations for follow up therapy are one component of a multi-disciplinary discharge planning process, led by the attending physician.  Recommendations may be updated based on patient status, additional functional criteria and insurance authorization.  Follow Up Recommendations Home health PT    Assistance Recommended at Discharge Frequent or constant Supervision/Assistance  Functional Status Assessment Patient has had a recent decline in their functional status and demonstrates the ability to make significant improvements in function in a reasonable and predictable amount of time.  Equipment Recommendations  None recommended by PT    Recommendations for Other Services        Precautions / Restrictions Precautions Precautions: Fall      Mobility  Bed Mobility Overal bed mobility: Needs Assistance Bed Mobility: Supine to Sit     Supine to sit: Min assist     General bed mobility comments: multimodal cues  to  move legs to edge, able to push self up to sitting with min assist.    Transfers Overall transfer level: Needs assistance Equipment used: Rolling walker (2 wheels) Transfers: Sit to/from Stand;Bed to chair/wheelchair/BSC Sit to Stand: Mod assist   Step pivot transfers: Mod assist       General transfer comment: steady assist  stand and steady. Small steps to  to recliner. Stepped in place with RW support x 10.    Ambulation/Gait                  Stairs            Wheelchair Mobility    Modified Rankin (Stroke Patients Only)       Balance Overall balance assessment: Needs assistance Sitting-balance support: Bilateral upper extremity supported;Feet supported Sitting balance-Leahy Scale: Fair     Standing balance support: During functional activity;Bilateral upper extremity supported;Reliant on assistive device for balance Standing balance-Leahy Scale: Poor                               Pertinent Vitals/Pain Pain Assessment: Faces Pain Score: 0-No pain    Home Living Family/patient expects to be discharged to:: Private residence Living Arrangements: Children Available Help at Discharge: Family;Friend(s);Available 24 hours/day Type of Home: House Home Access:  Stairs to enter   [employer] of Steps: 4   Home Layout: One level Home Equipment: Conservation officer, nature (2 wheels);BSC/3in1 Additional Comments: previous encounter reports ramp. pt. reports 5 YO grandson  stays with her.    Prior Function Prior Level of Function : Needs assist  Cognitive Assist : Mobility (cognitive);ADLs (cognitive) Mobility (Cognitive): Step by step cues ADLs (Cognitive): Intermittent cues Physical Assist  : Mobility (physical);ADLs (physical) Mobility (physical): Bed mobility;Gait ADLs (physical): Bathing;Dressing;Toileting Mobility Comments: Patient states daughter and  grandson assist her to get OOB , ambulate with RW. no family present for confirmation.       Hand Dominance   Dominant Hand: Right    Extremity/Trunk Assessment   Upper Extremity Assessment Upper Extremity Assessment: Generalized weakness    Lower Extremity Assessment Lower Extremity Assessment: Generalized weakness    Cervical / Trunk Assessment Cervical / Trunk Assessment: Normal  Communication   Communication: No difficulties  Cognition Arousal/Alertness: Awake/alert Behavior During Therapy: WFL for tasks assessed/performed Overall Cognitive Status: History of cognitive impairments - at baseline                                 General Comments: oriented to hospital, month and has Covid        General Comments      Exercises  Stand in place and march x 10, LAQ x 10 - both legs.   Assessment/Plan    PT Assessment Patient needs continued PT services  PT Problem List Decreased strength;Decreased mobility;Decreased safety awareness;Decreased knowledge of precautions;Decreased activity tolerance;Decreased cognition;Decreased balance       PT Treatment Interventions DME instruction;Therapeutic activities;Cognitive remediation;Gait training;Therapeutic exercise;Functional mobility training    PT Goals (Current goals can be found in the Care Plan section)  Acute Rehab PT Goals Patient Stated Goal: agreed to getting up to recliner PT Goal Formulation: Patient unable to participate in goal setting Time For Goal Achievement: 11/03/21 Potential to Achieve Goals: Good    Frequency Min 3X/week   Barriers to discharge        Co-evaluation               AM-PAC PT "6 Clicks" Mobility  Outcome Measure Help needed turning from your back to your side while in a flat bed without using  bedrails?: A Lot Help needed moving from lying on your back to sitting on the side of a flat bed without using bedrails?: A Lot Help needed moving to and from a bed to a chair (including a wheelchair)?: A Lot Help needed standing up from a chair using your arms (e.g., wheelchair or bedside chair)?: A Lot Help needed to walk in hospital room?: Total Help needed climbing 3-5 steps with a railing? : Total 6 Click Score: 10    End of Session Equipment Utilized During Treatment: Gait belt Activity Tolerance: Patient tolerated treatment well Patient left: in chair;with call bell/phone within reach;with chair alarm set;with nursing/sitter in room Nurse Communication: Mobility status PT Visit Diagnosis: Unsteadiness on feet (R26.81);Difficulty in walking, not elsewhere classified (R26.2)    Time: 2694-8546 PT Time Calculation (min) (ACUTE ONLY): 27 min   Charges:   PT Evaluation $PT Eval Low Complexity: 1 Low PT Treatments $Therapeutic Activity: 8-22 mins        Tresa Endo PT Acute Rehabilitation Services Pager [phone] Office [phone]   Claretha Cooper 10/20/2021, 8:57 AM

## 2021-10-21 ENCOUNTER — Other Ambulatory Visit (HOSPITAL_COMMUNITY): Payer: Self-pay

## 2021-10-21 ENCOUNTER — Telehealth: Payer: Self-pay

## 2021-10-21 DIAGNOSIS — U071 COVID-19: Secondary | ICD-10-CM | POA: Diagnosis not present

## 2021-10-21 LAB — URINE CULTURE: Culture: >=100000 — AB

## 2021-10-21 LAB — GLUCOSE, CAPILLARY
Glucose-Capillary: 99 mg/dL (ref 70–99)
Glucose-Capillary: 113 mg/dL — ABNORMAL HIGH (ref 70–99)

## 2021-10-21 MED ORDER — CEPHALEXIN 500 MG PO CAPS
500.0000 mg | ORAL_CAPSULE | Freq: Three times a day (TID) | ORAL | Status: DC
  Administered 2021-10-21: 09:00:00 500 mg via ORAL
  Filled 2021-10-21: qty 1

## 2021-10-21 MED ORDER — PREDNISONE 20 MG PO TABS
40.0000 mg | ORAL_TABLET | Freq: Every day | ORAL | 0 refills | Status: AC
  Filled 2021-10-21: qty 6, 3d supply, fill #0

## 2021-10-21 MED ORDER — CEPHALEXIN 500 MG PO CAPS
500.0000 mg | ORAL_CAPSULE | Freq: Three times a day (TID) | ORAL | 0 refills | Status: AC
  Filled 2021-10-21: qty 15, 5d supply, fill #0

## 2021-10-21 MED ORDER — BENZONATATE 100 MG PO CAPS
100.0000 mg | ORAL_CAPSULE | Freq: Three times a day (TID) | ORAL | 0 refills | Status: DC | PRN
  Filled 2021-10-21: qty 20, 7d supply, fill #0

## 2021-10-21 MED ORDER — NIRMATRELVIR/RITONAVIR (PAXLOVID) TABLET (RENAL DOSING)
2.0000 | ORAL_TABLET | Freq: Two times a day (BID) | ORAL | 0 refills | Status: AC
  Filled 2021-10-21: qty 12, 3d supply, fill #0
  Filled 2021-10-21: qty 20, 5d supply, fill #0

## 2021-10-21 NOTE — Evaluation (Signed)
Occupational Therapy Evaluation Patient Details Name: [name] MRN: [id number] DOB: 26-Apr-1946 Today's Date: 10/21/2021   History of Present Illness [name] is a 75 y.o. F with mild dementia lives with daughter, COPD not on O2, hx NSCLC s/p chemo-rad no surgery, diverticular abscess resolved, Takotsubo CM resolved, HTN and DM who presented 10/21/21. with weakness for 1 day. Tested positve for Covid.   Clinical Impression   Patient is currently requiring maximum assistance with LB ADLs including toileting, LE dressing, and LB bathing, and Min guard assist with UE ADLs and seated grooming.  Patient's typical baseline is unknown with no family present and pt with h/o dementia and disoriented to year and situation. During this evaluation, patient was limited by generalized weakness, impaired activity tolerance, and unaware of large bowel incontinence at bed level, all of which has the potential to impact patient's health, safety and independence during functional mobility, as well as performance for ADLs.  Patient lives with her family.  From chart it seems that pt's daughter and 51 year old grandson are often with the pt.  Exact amount of supervision available is unknown. Patient demonstrates good rehab potential, and should benefit from continued skilled occupational therapy services while in acute care to maximize safety, independence and quality of life at home.  Continued occupational therapy services in the home is recommended as long as pt with 24/7 assistance.  ?     Recommendations for follow up therapy are one component of a multi-disciplinary discharge planning process, led by the attending physician.  Recommendations may be updated based on patient status, additional functional criteria and insurance authorization.   Follow Up Recommendations  Home health OT    Assistance Recommended at Discharge Frequent or constant Supervision/Assistance  Functional Status Assessment   Patient has had a recent decline in their functional status and demonstrates the ability to make significant improvements in function in a reasonable and predictable amount of time.  Equipment Recommendations       Recommendations for Other Services       Precautions / Restrictions Precautions Precautions: Fall Restrictions Weight Bearing Restrictions: No      Mobility Bed Mobility Overal bed mobility: Needs Assistance Bed Mobility: Supine to Sit     Supine to sit: Min assist     General bed mobility comments: Pt reaching out iwth one hand to pull trunk from bed. Pt reports that her grandson assists with this at home.    Transfers Overall transfer level: Needs assistance Equipment used: Rolling walker (2 wheels) Transfers: Sit to/from Stand;Bed to chair/wheelchair/BSC Sit to Stand: Mod assist     Step pivot transfers: Min assist            Balance Overall balance assessment: Needs assistance Sitting-balance support: Bilateral upper extremity supported;Feet supported Sitting balance-Leahy Scale: Fair     Standing balance support: During functional activity;Bilateral upper extremity supported;Reliant on assistive device for balance Standing balance-Leahy Scale: Poor                             ADL either performed or assessed with clinical judgement   ADL Overall ADL's : Needs assistance/impaired (Baseline unknown. No family present.) Eating/Feeding: Sitting;Set up   Grooming: Wash/dry hands;Sitting;Set up   Upper Body Bathing: Min guard;Set up;Sitting;Cueing for sequencing   Lower Body Bathing: Maximal assistance;Sit to/from stand;Sitting/lateral leans;Cueing for safety;Cueing for sequencing   Upper Body Dressing : Min guard;Sitting;Cueing for sequencing   Lower Body  Dressing: Total assistance;Sitting/lateral leans;Sit to/from stand   Toilet Transfer: BSC/3in1;Rolling walker (2 wheels);Stand-pivot   Toileting- Water quality scientist and  Hygiene: Sit to/from stand;Maximal assistance Toileting - Clothing Manipulation Details (indicate cue type and reason): Pt discovered with large bowel movement in her mesh underwear once mobilizing to EOB. Pt assisted to [facility] and able to wash most of her anterior peri area with setup of warm washclothes and cues. Total Assist to perform hyggiene to posterior peri and upper LE areas. Pt reported being unaware of bowel movement which is not her baseline, per pt report.  Pt was aware of when voiding bladder to pure wick which was replaced with a clean one.     Functional mobility during ADLs: Minimal assistance;Min guard;Rolling walker (2 wheels)       Vision   Vision Assessment?: No apparent visual deficits     Perception Perception Perception: Not tested   Praxis Praxis Praxis: Intact    Pertinent Vitals/Pain Pain Assessment: No/denies pain Pain Score: 0-No pain     Hand Dominance Right   Extremity/Trunk Assessment Upper Extremity Assessment Upper Extremity Assessment: Overall WFL for tasks assessed   Lower Extremity Assessment Lower Extremity Assessment: Generalized weakness   Cervical / Trunk Assessment Cervical / Trunk Assessment: Normal   Communication Communication Communication: Other (comment);HOH (Denies hearing loss but shouts to talk and cannot hear voices in regular volume)   Cognition Arousal/Alertness: Awake/alert Behavior During Therapy: WFL for tasks assessed/performed Overall Cognitive Status: History of cognitive impairments - at baseline                                 General Comments: oriented to hospital, month and not year or situation.  Per PT note yesterday pt able to state that she has covid. Today, pt expressed surprise when told she is hospital for Covid.     General Comments       Exercises     Shoulder Instructions      Home Living Family/patient expects to be discharged to:: Private residence Living Arrangements:  Children Available Help at Discharge: Family;Friend(s);Available 24 hours/day Type of Home: House Home Access: Stairs to enter [employer] of Steps: 4 Entrance Stairs-Rails: Right Home Layout: One level     Bathroom Shower/Tub: Teacher, early years/pre: Standard     Home Equipment: Conservation officer, nature (2 wheels);BSC/3in1   Additional Comments: previous encounter reports ramp. pt. reports 51 YO grandson  stays with her.      Prior Functioning/Environment Prior Level of Function : Needs assist  Cognitive Assist : Mobility (cognitive);ADLs (cognitive) Mobility (Cognitive): Step by step cues ADLs (Cognitive): Intermittent cues Physical Assist : Mobility (physical);ADLs (physical) Mobility (physical): Bed mobility;Gait ADLs (physical): Bathing;Dressing;Toileting Mobility Comments: Patient states daughter and  grandson assist her to get OOB , ambulate with RW. no family present for confirmation.          OT Problem List: Decreased knowledge of use of DME or AE;Impaired balance (sitting and/or standing);Decreased activity tolerance;Decreased safety awareness;Decreased strength;Cardiopulmonary status limiting activity      OT Treatment/Interventions: Self-care/ADL training;Therapeutic activities;Cognitive remediation/compensation;Energy conservation;DME and/or AE instruction;Patient/family education;Balance training    OT Goals(Current goals can be found in the care plan section) Acute Rehab OT Goals Patient Stated Goal: Go home today. "Take my IV out" OT Goal Formulation: With patient Time For Goal Achievement: 11/04/21 Potential to Achieve Goals: Good ADL Goals Pt Will Perform Grooming: standing;with supervision (at least  2 tasks) Pt Will Perform Lower Body Bathing: with min guard assist;sitting/lateral leans;sit to/from stand Pt Will Transfer to Toilet: with supervision;ambulating Pt Will Perform Toileting - Clothing Manipulation and hygiene: with adaptive  equipment;with min assist;sitting/lateral leans;sit to/from stand  OT Frequency: Min 2X/week   Barriers to D/C:            Co-evaluation              AM-PAC OT "6 Clicks" Daily Activity     Outcome Measure Help from another person eating meals?: A Little Help from another person taking care of personal grooming?: A Little Help from another person toileting, which includes using toliet, bedpan, or urinal?: A Lot Help from another person bathing (including washing, rinsing, drying)?: A Lot Help from another person to put on and taking off regular upper body clothing?: A Little Help from another person to put on and taking off regular lower body clothing?: A Lot 6 Click Score: 15   End of Session Equipment Utilized During Treatment: Gait belt;Rolling walker (2 wheels) Nurse Communication: Other (comment) (Pt up in chair and had large BM)  Activity Tolerance: Patient tolerated treatment well Patient left: in chair;with call bell/phone within reach;with chair alarm set  OT Visit Diagnosis: Unsteadiness on feet (R26.81);Other symptoms and signs involving cognitive function                Time: 1152-1220 OT Time Calculation (min): 28 min Charges:  OT General Charges $OT Visit: 1 Visit OT Evaluation $OT Eval Low Complexity: 1 Low OT Treatments $Self Care/Home Management : 8-22 mins  [name], OT Acute Rehab Services Office: [phone] 10/21/2021  [name] 10/21/2021, 12:51 PM

## 2021-10-21 NOTE — TOC Progression Note (Signed)
Transition of Care [facility]) - Progression Note    Patient Details  Name: [name] MRN: [id number] Date of Birth: 09-19-1946  [facility] CM/SW Contact  Leeroy Cha, RN Phone Number: 10/21/2021, 8:28 AM  Clinical Narrative:     Home health for physical therapy ordered through [place].  Expected Discharge Plan: Home/Self Care Barriers to Discharge: Continued Medical Work up  Expected Discharge Plan and Services Expected Discharge Plan: Home/Self Care   Discharge Planning Services: CM Consult   Living arrangements for the past 2 months: Single Family Home                           HH Arranged: PT [place]: [place] Date [place]: 10/21/21 Time [place]: [phone] Representative spoke with at [place]: [place] ([place]) Interventions    Readmission Risk Interventions No flowsheet data found.

## 2021-10-21 NOTE — Progress Notes (Signed)
Patient dc'd per order. IV removed. Pt assisted with clothing. DC via wheelchair. Daughter picked at curbside. Briefly went through instructions with daughter over the phone due not coming up to room

## 2021-10-21 NOTE — Plan of Care (Signed)
°  Problem: Clinical Measurements: Goal: Ability to maintain clinical measurements within normal limits will improve Outcome: Adequate for Discharge Goal: Will remain free from infection Outcome: Adequate for Discharge Goal: Diagnostic test results will improve Outcome: Adequate for Discharge Goal: Respiratory complications will improve Outcome: Adequate for Discharge Goal: Cardiovascular complication will be avoided Outcome: Adequate for Discharge   Problem: Nutrition: Goal: Adequate nutrition will be maintained Outcome: Adequate for Discharge   Problem: Pain Managment: Goal: General experience of comfort will improve Outcome: Adequate for Discharge   Problem: Education: Goal: Knowledge of risk factors and measures for prevention of condition will improve Outcome: Adequate for Discharge   Problem: Respiratory: Goal: Will maintain a patent airway Outcome: Adequate for Discharge Goal: Complications related to the disease process, condition or treatment will be avoided or minimized Outcome: Adequate for Discharge

## 2021-10-21 NOTE — Telephone Encounter (Signed)
FYI, pt currently admitted at [facility] since 12/27  [place] [facility] - Client TELEPHONE ADVICE RECORD Access[employer] Patient Name: OPAL DINNING Gender: Female DOB: 05-05-1946 Age: 75 Y 19 M 28 D Return Phone Number: [phone] (Primary) Address: City/State/Zip: [place] [address] Client [facility] - Client Client [facility] [place] - Day Provider Crissie Sickles - MD Contact Type Call Who Is Calling Patient / Member / Family / Caregiver Call Type Triage / Clinical Caller Name Delores Relationship To Patient Daughter Return Phone Number [phone] (Primary) Chief Complaint Walking difficulty Reason for Call Symptomatic / Request for Health Information Initial Comment caller states mother is patient woke up extremely weak, difficulty walking. sees Dr Anitra Lauth Translation No Nurse Assessment Nurse: Clovis Riley, RN, Georgina Peer Date/Time (Eastern Time): 10/19/2021 10:51:19 AM Confirm and document reason for call. If symptomatic, describe symptoms. ---caller states mother is patient woke up extremely weak, difficulty walking. States she has some mobility problems. She is eating breakfast right now and had to be assisted times 2. Does the patient have any new or worsening symptoms? ---Yes Will a triage be completed? ---Yes Related visit to physician within the last 2 weeks? ---No Does the PT have any chronic conditions? (i.e. diabetes, asthma, this includes High risk factors for pregnancy, etc.) ---Yes List chronic conditions. ---hx of cancer, diabetes, copd, HTN Is this a behavioral health or substance abuse call? ---No Guidelines Guideline Title Affirmed Question Affirmed Notes Nurse Date/Time (Eastern Time) Weakness (Generalized) and Fatigue [1] MODERATE weakness (i.e., interferes with work, school, normal activities) AND [2] cause unknown (Exceptions: weakness with acute minor illness, or Clovis Riley, RNGeorgina Peer 10/19/2021 10:53:43 AM PLEASE NOTE: All timestamps contained within this report are represented as [place] Standard Time. CONFIDENTIALTY NOTICE: This fax transmission is intended only for the addressee. It contains information that is legally privileged, confidential or otherwise protected from use or disclosure. If you are not the intended recipient, you are strictly prohibited from reviewing, disclosing, copying using or disseminating any of this information or taking any action in reliance on or regarding this information. If you have received this fax in error, please notify us immediately by telephone so that we can arrange for its return to [place]. Phone: [phone], Toll-Free: [phone], Fax: [phone] Page: 2 of 2 Call Id: [id number] Guidelines Guideline Title Affirmed Question Affirmed Notes Nurse Date/Time Eilene Ghazi Time) weakness from poor fluid intake) Disp. Time Eilene Ghazi Time) Disposition Final User 10/19/2021 10:58:39 AM See HCP within 4 Hours (or PCP triage) Yes Clovis Riley, RN, Leilani Merl Disagree/Comply Comply Caller Understands Yes PreDisposition Go to ED Care Advice Given Per Guideline SEE HCP (OR PCP TRIAGE) WITHIN 4 HOURS: * IF OFFICE WILL BE CLOSED AND NO PCP (PRIMARY CARE PROVIDER) SECOND-LEVEL TRIAGE: You need to be seen within the next 3 or 4 hours. A nearby Urgent Care Center [facility]) is often a good source of care. Another choice is to go to the ED. Go sooner if you become worse. CALL BACK IF: * You become worse CARE ADVICE given per Weakness and Fatigue (Adult) guideline. Comments User: Arman Bogus, RN Date/Time Eilene Ghazi Time): 10/19/2021 10:59:23 AM No appointments in office today Referrals [facility] - ED

## 2021-10-21 NOTE — TOC Transition Note (Signed)
Transition of Care [facility]) - CM/SW Discharge Note   Patient Details  Name: [name] MRN: [id number] Date of Birth: 1946/09/24  Transition of Care [facility]) CM/SW Contact:  Leeroy Cha, RN Phone Number: 10/21/2021, 10:38 AM   Clinical Narrative:    Patient dcd to return with home health care for pt through [facility] no other needs present.   Final next level of care: Home/Self Care Barriers to Discharge: No Barriers Identified, Barriers Resolved   Patient Goals and CMS Choice Patient states their goals for this hospitalization and ongoing recovery are:: to go back home CMS Medicare.gov Compare Post Acute Care list provided to:: Patient    Discharge Placement                       Discharge Plan and Services   Discharge Planning Services: CM Consult                      HH Arranged: PT [facility] Agency: [place] Date [place]: 10/21/21 Time [place]: [phone] Representative spoke with at [place]: [email or web address]  Social Determinants of Health ([place]) Interventions     Readmission Risk Interventions No flowsheet data found.

## 2021-10-21 NOTE — Discharge Summary (Signed)
Discharge Summary  [name] GUY:[id number] DOB: 1946-02-05  PCP: [name], MD  Admit date: 10/19/2021 Discharge date: 10/21/2021  Time spent: 35 minutes.  Recommendations for Outpatient Follow-up:  Follow-up with your primary care provider in 1 to 2 weeks. Continue isolation x10 days from 10/19/2021, wear a mask when in public. Continue PT OT with assistance and fall precautions.  Discharge Diagnoses:  Active Hospital Problems   Diagnosis Date Noted   COVID-19 virus infection 10/19/2021   Dehydration 10/19/2021   Hyponatremia 10/19/2021   Dementia without behavioral disturbance ([place]) 10/19/2021   Pyuria 10/19/2021   Essential hypertension 12/01/2016   Controlled type 2 diabetes mellitus with hyperglycemia, with long-term current use of insulin ([place]) 06/14/2016   COPD exacerbation ([place]) 10/18/2013    Resolved Hospital Problems  No resolved problems to display.    Discharge Condition: Stable  Diet recommendation: Resume previous diet  Vitals:   10/21/21 0550 10/21/21 0931  BP: 112/60   Pulse: 77   Resp: 18   Temp: 97.8 F (36.6 C)   SpO2: 98% 98%    History of present illness:  [name] is a 75 y.o. F with mild dementia lives with daughter, COPD not on O2, hx NSCLC s/p chemo-rad no surgery, diverticular abscess resolved, Takotsubo cardiomyopathy, HTN and DM2 who presented with weakness for 1 day.  On the day of admission, she was noted to be too weak to get up at all or stand.  In the ER, vital signs unremarkable but labs revealing mild hyponatremia.  She tested positive for COVID, no hypoxia, chest x-ray without opacities.  On initial assessment, EDP thought the patient might have had a stroke, MRI brain was non acute.  UA was positive for pyuria, urine culture showed pansensitive E. coli, started on Keflex x5 days.  10/21/2021: Patient seen at bedside.  There were no acute events overnight.  She has no new complaint.  She is eager to go  home.  Hospital Course:  Principal Problem:   COVID-19 virus infection Active Problems:   COPD exacerbation ([place])   Controlled type 2 diabetes mellitus with hyperglycemia, with long-term current use of insulin ([facility])   Essential hypertension   Dehydration   Hyponatremia   Dementia without behavioral disturbance ([facility])   Pyuria  COVID-19 virus infection- (present on admission) No hypoxia or infiltrates on chest x-ray. Complete course of Paxlovid   E. coli UTI, POA Keflex 5 mg 3 times daily x5 days. Follow-up with your PCP   Hyponatremia- (present on admission) Sodium 129 - IV fluids and trend sodium   Resolved dehydration- (present on admission) Due to COVID 19 infection Received gentle IV fluid hydration   Essential hypertension- (present on admission) BP at goal Continue home metoprolol   Controlled type 2 diabetes mellitus with hyperglycemia, with long-term current use of insulin ([facility]) A1c 6.9%, hyperglycemic here Resume home regimen Follow-up with your PCP   Resolved COPD exacerbation ([place])- (present on admission) On initial assessment very wheezy on exam, likely triggered by COVID.Marland Kitchen Complete course of steroids, prednisone. Complete course of antibiotic, paxlovid Continue bronchodilators as needed. Oxygen saturation 98% on room air on 10/21/2021.  Ambulatory dysfunction/physical debility PT OT recommended home health PT OT Continue fall precautions.      Discharge Exam: BP 112/60 (BP Location: Right Arm)    Pulse 77    Temp 97.8 F (36.6 C) (Oral)    Resp 18    Wt 61.7 kg Comment: 08/02/21   SpO2 98%  BMI 24.88 kg/m  General: 75 y.o. year-old female well developed well nourished in no acute distress.  Alert and interactive. Cardiovascular: Regular rate and rhythm with no rubs or gallops.  No thyromegaly or JVD noted.   Respiratory: Clear to auscultation with no wheezes or rales. Good inspiratory effort. Abdomen: Soft nontender nondistended with normal  bowel sounds x4 quadrants. Musculoskeletal: No lower extremity edema. 2/4 pulses in all 4 extremities. Psychiatry: Mood is appropriate for condition and setting  Discharge Instructions You were cared for by a hospitalist during your hospital stay. If you have any questions about your discharge medications or the care you received while you were in the hospital after you are discharged, you can call the unit and asked to speak with the hospitalist on call if the hospitalist that took care of you is not available. Once you are discharged, your primary care physician will handle any further medical issues. Please note that NO REFILLS for any discharge medications will be authorized once you are discharged, as it is imperative that you return to your primary care physician (or establish a relationship with a primary care physician if you do not have one) for your aftercare needs so that they can reassess your need for medications and monitor your lab values.   Allergies as of 10/21/2021       Reactions   Lactose Intolerance (gi) Diarrhea   Ibuprofen Other (See Comments)   REACTION: Anxiousness, hyperventilates        Medication List     TAKE these medications    albuterol 108 (90 Base) MCG/ACT inhaler Commonly known as: VENTOLIN HFA INHALE 2 PUFFS BY MOUTH EVERY 6 HOURS AS NEEDED FOR WHEEZING OR SHORTNESS OF BREATH What changed:  how much to take when to take this reasons to take this additional instructions   albuterol (2.5 MG/3ML) 0.083% nebulizer solution Commonly known as: PROVENTIL USE 1 VIAL IN NEBULIZER EVERY 4 HOURS AS NEEDED FOR WHEEZING AND FOR SHORTNESS OF BREATH What changed: See the new instructions.   aspirin 81 MG EC tablet Take 81 mg by mouth daily.   atorvastatin 40 MG tablet Commonly known as: LIPITOR Take 1 tablet by mouth once daily   benzonatate 100 MG capsule Commonly known as: TESSALON Take 1 capsule (100 mg total) by mouth 3 (three) times daily as  needed for cough.   buPROPion 150 MG 24 hr tablet Commonly known as: WELLBUTRIN XL Take 1 tablet by mouth once daily What changed: how to take this   Caltrate 600+D Plus Minerals 600-800 MG-UNIT Tabs Take 1-2 tablets by mouth See admin instructions. Takes 1 tablet in the morning and 2 tablets at night   cephALEXin 500 MG capsule Commonly known as: KEFLEX Take 1 capsule (500 mg total) by mouth every 8 (eight) hours for 5 days.   cetirizine 10 MG tablet Commonly known as: ZYRTEC Take 10 mg by mouth daily.   escitalopram 20 MG tablet Commonly known as: LEXAPRO Take 1 tablet by mouth once daily   Flovent HFA 220 MCG/ACT inhaler Generic drug: fluticasone Inhale 2 puffs into the lungs 2 (two) times daily.   HumuLIN 70/30 KwikPen (70-30) 100 UNIT/ML KwikPen Generic drug: insulin isophane & regular human KwikPen INJECT 4 UNITS SUBCUTANEOUSLY WITH BREAKFAST AND 2 UNITS WITH SUPPER What changed:  how much to take when to take this additional instructions   ibandronate 150 MG tablet Commonly known as: Boniva Take 1 tablet (150 mg total) by mouth every 30 (thirty) days. Take  in the morning with a full glass of water, on an empty stomach, and do not take anything else by mouth or lie down for the next 30 min.   metoprolol tartrate 25 MG tablet Commonly known as: LOPRESSOR Take 1/2 (one-half) tablet by mouth twice daily What changed: See the new instructions.   MULTIVITAMIN WOMEN PO Take 1 tablet by mouth daily.   nirmatrelvir/ritonavir EUA (renal dosing) 10 x 150 MG & 10 x 100MG  Tabs Commonly known as: PAXLOVID Take 2 tablets by mouth 2 (two) times daily for 3 days   OneTouch Delica Plus [id number] Misc USE 1  TO CHECK GLUCOSE THREE TIMES DAILY   OneTouch Ultra test strip Generic drug: glucose blood USE 1 STRIP TO CHECK GLUCOSE THREE TIMES DAILY   pantoprazole 40 MG tablet Commonly known as: PROTONIX Take 1 tablet by mouth once daily What changed: how to take this    predniSONE 20 MG tablet Commonly known as: DELTASONE Take 2 tablets (40 mg total) by mouth daily with breakfast for 3 days.       Allergies  Allergen Reactions   Lactose Intolerance (Gi) Diarrhea   Ibuprofen Other (See Comments)    REACTION: Anxiousness, hyperventilates    Follow-up Information     McGowen, [name], MD. Call today.   Specialty: Family Medicine Why: Please call for a posthospital follow-up appointment. Contact information: [address] [place] [address] [phone]         Leonie Man, MD .   Specialty: Cardiology Contact information: [address] [place] [address] [phone]                  The results of significant diagnostics from this hospitalization (including imaging, microbiology, ancillary and laboratory) are listed below for reference.    Significant Diagnostic Studies: DG Chest 2 View  Result Date: 10/19/2021 CLINICAL DATA:  Cough weakness. EXAM: CHEST - 2 VIEW COMPARISON:  July 20, 2021 and September 08, 2021. FINDINGS: Paramediastinal radiotherapy changes in the RIGHT upper lobe with resultant volume loss/consolidation similar to previous imaging, scaring the upper margin of the RIGHT mediastinal border. Nodular density at the LEFT lung apex seen on previous radiograph is no longer seen. Cardiomediastinal contours and hilar structures are unchanged, mild cardiac enlargement and some elevation of the RIGHT hemidiaphragm as before. No sign of pleural effusion. EKG leads project over the patient's chest. On limited assessment there is no acute skeletal process. IMPRESSION: Paramediastinal radiotherapy changes in the RIGHT upper lobe with resultant volume loss/consolidation similar to previous imaging. No acute cardiopulmonary disease. Electronically Signed   By: Zetta Bills M.D.   On: 10/19/2021 13:34   CT HEAD WO CONTRAST (5MM)  Result Date: 10/19/2021 CLINICAL DATA:  Weakness EXAM: CT  HEAD WITHOUT CONTRAST TECHNIQUE: Contiguous axial images were obtained from the base of the skull through the vertex without intravenous contrast. COMPARISON:  05/28/2021 and previous FINDINGS: Brain: Diffuse parenchymal atrophy. Patchy areas of hypoattenuation in deep and periventricular white matter bilaterally. Negative for acute intracranial hemorrhage, mass lesion, acute infarction, midline shift, or mass-effect. Acute infarct may be inapparent on noncontrast CT. Ventricles and sulci symmetric. Vascular: No hyperdense vessel or unexpected calcification. Skull: Normal. Negative for fracture or focal lesion. Sinuses/Orbits: Hypoplastic frontal sinuses.  Otherwise negative. Other: None IMPRESSION: 1. Negative for bleed or other acute intracranial process. 2. Atrophy and nonspecific white matter changes. Electronically Signed   By: Lucrezia Europe M.D.   On: 10/19/2021 13:55  MR BRAIN WO CONTRAST  Result Date: 10/19/2021 CLINICAL DATA:  Neuro deficit, stroke suspected EXAM: MRI HEAD WITHOUT CONTRAST TECHNIQUE: Multiplanar, multiecho pulse sequences of the brain and surrounding structures were obtained without intravenous contrast. COMPARISON:  09/25/2016 FINDINGS: Evaluation is somewhat limited by motion artifact. Brain: No restricted diffusion to suggest acute or subacute infarct. No acute hemorrhage, mass, mass effect, or midline shift. Scattered punctate foci of hemosiderin deposition in the bilateral cerebral hemispheres. Diffuse parenchymal atrophy, advanced for age. Ventricles and sulci appear commensurate with degree atrophy. Confluent T2 hyperintense signal in the periventricular white matter, likely the sequela of severe chronic small vessel ischemic disease. Definite hydrocephalus or extra-axial collection. Vascular: Apparent loss of the left vertebral artery flow void, which was present on the 2017 exam. Otherwise normal flow voids. Skull and upper cervical spine: Normal marrow signal. Sinuses/Orbits:  Negative.  Status post bilateral lens replacements. Other: None. IMPRESSION: 1. Apparent loss of the left vertebral artery flow void; this flow void was present on the 2017 exam. 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 2. No evidence of acute or subacute infarct. 3. Punctate foci of hemosiderin deposition in the bilateral cerebral hemispheres, which is nonspecific but can be seen in the setting of cerebral amyloid angiopathy. Electronically Signed   By: Merilyn Baba M.D.   On: 10/19/2021 16:43    Microbiology: Recent Results (from the past 240 hour(s))  Urine Culture     Status: Abnormal   Collection Time: 10/19/21  3:43 PM   Specimen: Urine, Clean Catch  Result Value Ref Range Status   Specimen Description   Final    URINE, CLEAN CATCH Performed at [facility], [place] [address], [place], [place] [address]    Special Requests   Final    NONE Performed at [facility], [place] [address], [place], [place] [address]    Culture >=100,000 COLONIES/mL ESCHERICHIA COLI (A)  Final   Report Status 10/21/2021 FINAL  Final   Organism ID, Bacteria ESCHERICHIA COLI (A)  Final      Susceptibility   Escherichia coli - MIC*    AMPICILLIN <=2 SENSITIVE Sensitive     CEFAZOLIN <=4 SENSITIVE Sensitive     CEFEPIME <=0.12 SENSITIVE Sensitive     CEFTRIAXONE <=0.25 SENSITIVE Sensitive     CIPROFLOXACIN <=0.25 SENSITIVE Sensitive     GENTAMICIN <=1 SENSITIVE Sensitive     IMIPENEM <=0.25 SENSITIVE Sensitive     NITROFURANTOIN <=16 SENSITIVE Sensitive     TRIMETH/SULFA <=20 SENSITIVE Sensitive     AMPICILLIN/SULBACTAM <=2 SENSITIVE Sensitive     PIP/TAZO <=4 SENSITIVE Sensitive     * >=100,000 COLONIES/mL ESCHERICHIA COLI  Resp Panel by RT-PCR (Flu A&B, Covid) Nasopharyngeal Swab     Status: Abnormal   Collection Time: 10/19/21  4:40 PM    Specimen: Nasopharyngeal Swab; Nasopharyngeal(NP) swabs in vial transport medium  Result Value Ref Range Status   SARS Coronavirus 2 by RT PCR POSITIVE (A) NEGATIVE Final    Comment: (NOTE) SARS-CoV-2 target nucleic acids are DETECTED.  The SARS-CoV-2 RNA is generally detectable in upper respiratory specimens during the acute phase of infection. Positive results are indicative of the presence of the identified virus, but do not rule out bacterial infection or co-infection with other pathogens not detected by the test. Clinical correlation with patient history and other diagnostic information is necessary to determine patient  infection status. The expected result is Negative.  Fact Sheet for Patients: EntrepreneurPulse.com.au  Fact Sheet for Healthcare Providers: IncredibleEmployment.be  This test is not yet approved or cleared by the [place] FDA and  has been authorized for detection and/or diagnosis of SARS-CoV-2 by FDA under an Emergency Use Authorization (EUA).  This EUA will remain in effect (meaning this test can be used) for the duration of  the COVID-19 declaration under Section 564(b)(1) of the A ct, 21 U.S.C. section 360bbb-3(b)(1), unless the authorization is terminated or revoked sooner.     Influenza A by PCR NEGATIVE NEGATIVE Final   Influenza B by PCR NEGATIVE NEGATIVE Final    Comment: (NOTE) The Xpert Xpress SARS-CoV-2/FLU/RSV plus assay is intended as an aid in the diagnosis of influenza from Nasopharyngeal swab specimens and should not be used as a sole basis for treatment. Nasal washings and aspirates are unacceptable for Xpert Xpress SARS-CoV-2/FLU/RSV testing.  Fact Sheet for Patients: EntrepreneurPulse.com.au  Fact Sheet for Healthcare Providers: IncredibleEmployment.be  This test is not yet approved or cleared by the [place] FDA and has been authorized for detection  and/or diagnosis of SARS-CoV-2 by FDA under an Emergency Use Authorization (EUA). This EUA will remain in effect (meaning this test can be used) for the duration of the COVID-19 declaration under Section 564(b)(1) of the Act, 21 U.S.C. section 360bbb-3(b)(1), unless the authorization is terminated or revoked.  Performed at [facility], [place] [address], [place], [place] [address]      Labs: Basic Metabolic Panel: Recent Labs  Lab 10/19/21 1241 10/20/21 0525 10/20/21 1604  NA 129* 131* 129*  K 4.3 3.9 3.9  CL 97* 99 93*  CO2 22 25 28   GLUCOSE 281* 96 195*  BUN 21 16 16   CREATININE 1.11* 0.88 0.76  CALCIUM 8.8* 8.6* 8.5*   Liver Function Tests: No results for input(s): AST, ALT, ALKPHOS, BILITOT, PROT, ALBUMIN in the last 168 hours. No results for input(s): LIPASE, AMYLASE in the last 168 hours. No results for input(s): AMMONIA in the last 168 hours. CBC: Recent Labs  Lab 10/19/21 1241  WBC 9.8  HGB 11.9*  HCT 36.4  MCV 91.9  PLT 180   Cardiac Enzymes: No results for input(s): CKTOTAL, CKMB, CKMBINDEX, TROPONINI in the last 168 hours. BNP: BNP (last 3 results) No results for input(s): BNP in the last 8760 hours.  ProBNP (last 3 results) No results for input(s): PROBNP in the last 8760 hours.  CBG: Recent Labs  Lab 10/20/21 1234 10/20/21 1633 10/20/21 2149 10/21/21 0810 10/21/21 1158  GLUCAP 121* 186* 233* 99 113*       Signed:  Kayleen Memos, MD [facility] 10/21/2021, 2:46 PM

## 2021-10-21 NOTE — Telephone Encounter (Signed)
Noted  

## 2021-10-21 NOTE — Progress Notes (Signed)
Spoke to daughter Daloris regarding DC. Family available to pick up at 1pm. Will prepare paperwork and get patient ready for DC. Family aware to call floor when they arrive to hospital.

## 2021-10-27 ENCOUNTER — Telehealth: Payer: Self-pay

## 2021-10-27 DIAGNOSIS — M19042 Primary osteoarthritis, left hand: Secondary | ICD-10-CM | POA: Diagnosis not present

## 2021-10-27 DIAGNOSIS — D509 Iron deficiency anemia, unspecified: Secondary | ICD-10-CM | POA: Diagnosis not present

## 2021-10-27 DIAGNOSIS — J441 Chronic obstructive pulmonary disease with (acute) exacerbation: Secondary | ICD-10-CM | POA: Diagnosis not present

## 2021-10-27 DIAGNOSIS — I509 Heart failure, unspecified: Secondary | ICD-10-CM | POA: Diagnosis not present

## 2021-10-27 DIAGNOSIS — D631 Anemia in chronic kidney disease: Secondary | ICD-10-CM | POA: Diagnosis not present

## 2021-10-27 DIAGNOSIS — E871 Hypo-osmolality and hyponatremia: Secondary | ICD-10-CM | POA: Diagnosis not present

## 2021-10-27 DIAGNOSIS — Z85118 Personal history of other malignant neoplasm of bronchus and lung: Secondary | ICD-10-CM | POA: Diagnosis not present

## 2021-10-27 DIAGNOSIS — U071 COVID-19: Secondary | ICD-10-CM | POA: Diagnosis not present

## 2021-10-27 DIAGNOSIS — F03A4 Unspecified dementia, mild, with anxiety: Secondary | ICD-10-CM | POA: Diagnosis not present

## 2021-10-27 DIAGNOSIS — E1122 Type 2 diabetes mellitus with diabetic chronic kidney disease: Secondary | ICD-10-CM | POA: Diagnosis not present

## 2021-10-27 DIAGNOSIS — I130 Hypertensive heart and chronic kidney disease with heart failure and stage 1 through stage 4 chronic kidney disease, or unspecified chronic kidney disease: Secondary | ICD-10-CM | POA: Diagnosis not present

## 2021-10-27 DIAGNOSIS — E785 Hyperlipidemia, unspecified: Secondary | ICD-10-CM | POA: Diagnosis not present

## 2021-10-27 DIAGNOSIS — I447 Left bundle-branch block, unspecified: Secondary | ICD-10-CM | POA: Diagnosis not present

## 2021-10-27 DIAGNOSIS — F32A Depression, unspecified: Secondary | ICD-10-CM | POA: Diagnosis not present

## 2021-10-27 DIAGNOSIS — E0590 Thyrotoxicosis, unspecified without thyrotoxic crisis or storm: Secondary | ICD-10-CM | POA: Diagnosis not present

## 2021-10-27 DIAGNOSIS — K219 Gastro-esophageal reflux disease without esophagitis: Secondary | ICD-10-CM | POA: Diagnosis not present

## 2021-10-27 DIAGNOSIS — M19041 Primary osteoarthritis, right hand: Secondary | ICD-10-CM | POA: Diagnosis not present

## 2021-10-27 DIAGNOSIS — N183 Chronic kidney disease, stage 3 unspecified: Secondary | ICD-10-CM | POA: Diagnosis not present

## 2021-10-27 DIAGNOSIS — F03A3 Unspecified dementia, mild, with mood disturbance: Secondary | ICD-10-CM | POA: Diagnosis not present

## 2021-10-27 DIAGNOSIS — E1165 Type 2 diabetes mellitus with hyperglycemia: Secondary | ICD-10-CM | POA: Diagnosis not present

## 2021-10-27 DIAGNOSIS — E11319 Type 2 diabetes mellitus with unspecified diabetic retinopathy without macular edema: Secondary | ICD-10-CM | POA: Diagnosis not present

## 2021-10-27 DIAGNOSIS — J302 Other seasonal allergic rhinitis: Secondary | ICD-10-CM | POA: Diagnosis not present

## 2021-10-27 DIAGNOSIS — I2510 Atherosclerotic heart disease of native coronary artery without angina pectoris: Secondary | ICD-10-CM | POA: Diagnosis not present

## 2021-10-27 DIAGNOSIS — H401130 Primary open-angle glaucoma, bilateral, stage unspecified: Secondary | ICD-10-CM | POA: Diagnosis not present

## 2021-10-27 NOTE — Telephone Encounter (Signed)
Please review and advise.

## 2021-10-27 NOTE — Telephone Encounter (Signed)
Anda Kraft calling from [facility] to get verbal orders for Physical Therapy for patient.  1x weekly for 1 week 2x weekly for 8 weeks   Anda Kraft can be reached at [phone].  This is a secured, incase voicemail is needed.

## 2021-10-27 NOTE — Telephone Encounter (Signed)
Yes okay 

## 2021-10-28 ENCOUNTER — Encounter (INDEPENDENT_AMBULATORY_CARE_PROVIDER_SITE_OTHER): Payer: Medicare Other | Admitting: Ophthalmology

## 2021-10-28 NOTE — Telephone Encounter (Signed)
V/o given to [place].

## 2021-10-29 ENCOUNTER — Other Ambulatory Visit: Payer: Self-pay

## 2021-10-29 ENCOUNTER — Ambulatory Visit (INDEPENDENT_AMBULATORY_CARE_PROVIDER_SITE_OTHER): Payer: Medicare Other | Admitting: Family Medicine

## 2021-10-29 ENCOUNTER — Encounter: Payer: Self-pay | Admitting: Family Medicine

## 2021-10-29 ENCOUNTER — Other Ambulatory Visit: Payer: Self-pay | Admitting: *Deleted

## 2021-10-29 VITALS — BP 99/61 | HR 96 | Temp 97.8°F | Ht 62.0 in | Wt 136.6 lb

## 2021-10-29 DIAGNOSIS — E871 Hypo-osmolality and hyponatremia: Secondary | ICD-10-CM

## 2021-10-29 DIAGNOSIS — Z87440 Personal history of urinary (tract) infections: Secondary | ICD-10-CM

## 2021-10-29 DIAGNOSIS — Z8616 Personal history of COVID-19: Secondary | ICD-10-CM | POA: Diagnosis not present

## 2021-10-29 DIAGNOSIS — N183 Chronic kidney disease, stage 3 unspecified: Secondary | ICD-10-CM

## 2021-10-29 DIAGNOSIS — Z794 Long term (current) use of insulin: Secondary | ICD-10-CM | POA: Diagnosis not present

## 2021-10-29 DIAGNOSIS — E7800 Pure hypercholesterolemia, unspecified: Secondary | ICD-10-CM

## 2021-10-29 DIAGNOSIS — E11319 Type 2 diabetes mellitus with unspecified diabetic retinopathy without macular edema: Secondary | ICD-10-CM | POA: Diagnosis not present

## 2021-10-29 DIAGNOSIS — I10 Essential (primary) hypertension: Secondary | ICD-10-CM

## 2021-10-29 DIAGNOSIS — J449 Chronic obstructive pulmonary disease, unspecified: Secondary | ICD-10-CM | POA: Diagnosis not present

## 2021-10-29 DIAGNOSIS — E778 Other disorders of glycoprotein metabolism: Secondary | ICD-10-CM | POA: Diagnosis not present

## 2021-10-29 NOTE — Progress Notes (Signed)
OFFICE VISIT  10/29/2021  CC:  Chief Complaint  Patient presents with   Follow-up    RCI; 3 month. Pt is not fasting    HPI:    Patient is a 76 y.o. female who presents accompanied by her daughter [name] for 3 mo f/u DM 2, HTN, HLD, and COPD. A/P as of last RCI visit 3 mo ago. "1) DM 2, good control. Cont 70/30 4U qAM and 2 U qPM.   2) HTN: good control on 12.63m lopressor bid.   3) HLD: tolerating atorva 40 qd. Next lipid panel 3 mo.   4) Dementia: stable, some personality changes but no behav disturbance.   5) COPD exacerbation, question of pneumonia (bronchial BS R upper lung field). Doxy 100 bid x 7d, prednisone 40 qd x 5d, then 20 qd x 5d. CXR today.   6) CRI III: avoids nsaids. Hydration has been adequate in the past.  ? Down some lately.   She will be due for labs at f/u in 10d (Hba1c and cmet)."  INTERIM HX: Patient had a hospitalization 12/27 to 10/21/2021.  She had generalized weakness and was found to have COVID illness. She also had a E. coli UTI and COPD exacerbation.  She was treated with paxlovid, antibiotics, and steroids and IV fluids. She went home on cephalexin and paxlovid. Home health PT.  Update: SSintiasays she feels fine. No cough, shortness of breath, or wheezing. No nausea, fatigue, or urinary complaints. Energy level back to baseline.  Home glucoses reviewed: Anywhere from 105-140 typically, with some more elevated ones in the last 5 days or so since being on steroids.  Past Medical History:  Diagnosis Date   Acute renal failure (HCC)    Age-related nuclear cataract of both eyes 2016   +cortical age related cataracts OU    Anxiety and depression    Bilateral diabetic retinopathy ([facility] 2015   Dr. 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   Colonic  diverticular abscess    COPD (chronic obstructive pulmonary disease) (HCC)    CVA (cerebral vascular accident) ([place] 09/2016   MRI did show a left-sided small ischemic stroke which was acute but likely incidental (MRI was done b/c pt had TIA sx's in hospital).  Neuro put her on plavix at that time.  Cardiology d/c'd her plavix 08/2017.   Diabetes mellitus with complication (HCC)    diab retinopathy OU (laser)   Diverticulitis of large intestine with perforation and abscess 09/11/2016   Finger fracture, right 04/2020   R 5th metacarpal fx at the base (Dr. LMardelle Matte   GERD (gastroesophageal reflux disease)    protonix   History of concussion 08/25/2018   w/out loss of consciousness--mild increase in baseline memory impairment after.  CT head neg acute.   History of diverticulitis of colon    with abscess; required IR percutaneous drain placed 09/2016.   History of iron deficiency anemia    started iron 07/02/19.  EGD/colonoscopy 08/27/19 unrevealing. Capsule endoscopy considered but never done.   Hyperlipidemia    Hypertension    Left bundle branch block (LBBB) 12/16/2016   Lung cancer ([facility]    non-small cell lung ca, stage III in 05/2011; systemic chemotherapy concurrent with radiation followed by prophylactic cranial irradiation and has been observation since July of 2010  with no evidence for disease recurrence-released from onc f/u 12/2014 (needs annual cxr by PCP).  CXR 08/2015 stable.  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 Earlie Server did bx and this was NEG for malignancy.   Mild cognitive impairment with memory loss    Likely from brain radiation therapy   Non-obstructive CAD (coronary artery disease)    a. Cath 2006 preserved LV fxn, scattered irregularities without critical stenosis; b. 2008 stress echo negative for ischemia, but with hypertensive response; c. 08/2016 Cath: D1 25%, otw nl.   Normocytic anemia 2018   Iron, vit B12, folate all normal 12/2016.   Open-angle glaucoma 2016   Dr.  Shirley Muscat, (bilateral)---responding to topical therapy   Osteoarthritis of both hands    Osteoporosis 03/2016; 03/2018   03/2016 DEXA T-score -2.2.  03/2018 T-score L femoral neck  -2.7--boniva started. DEXA 04/2021 T scor -2.6->boniva continued. Plan rpt DEXA 2 yrs.   Pneumonia    hospitalization 11/2016; hypoxemic resp failure--d/c'd home with home oxygen therapy   Pulmonary nodule, left 08/2021   LUL 11 mm ground glass, needs f/u CT 08/2022.   Seasonal allergic rhinitis    Subclinical hyperthyroidism 2018   Takotsubo cardiomyopathy    a. 08/2016 Echo: EF 30-35%, gr1DD, PASP 3mHg;  b. 08/2016 Cath: nonobs Dzs;  c. 11/2016 Echo: EF 40-45%, no rwma, nl RV fxn, mild TR, PASP 328mg.  ECHO 02/2017 EF 50-55%, grd II DD---essentially resolved Takotsubo 02/2017.   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 08/2016 in setting of diverticulitis & pneumoperitoneum and Takotsubo CM -->prolonged QT, seen by EP-->avoid meds with potential for QT prolongation.    Past Surgical History:  Procedure Laterality Date   ABDOMINAL HYSTERECTOMY  1997   APPENDECTOMY     CARDIAC CATHETERIZATION N/A 09/14/2016   Procedure: Left Heart Cath and Coronary Angiography;  Surgeon: ThTroy SineMD;  [facility];  Service: Cardiovascular; Mild nonobstructive CAD with 85% smooth narrowing in the first diagonal branch of the LAD; 10% smooth narrowing of the ostial proximal left circumflex coronary artery; and a normal dominant RCA.   CARPAL TUNNEL RELEASE     CATARACT EXTRACTION Bilateral    CHOLECYSTECTOMY  2002   COLONOSCOPY  10/24/00; 08/27/19   2002 normal.  [employer] hemoccult testing via [facility] 06/18/15 was NEG.  2020->diverticulosis o/w nl.   dexa  03/2016; 03/2018   03/2018 T-score L femoral neck  -2.7 (worsened compared to osteopenic range 2017.) DEXA 05/03/21 stable T score -2.6 on boniva x 2 yrs->plan rpt DEXA 2 yrs.   ESOPHAGOGASTRODUODENOSCOPY  08/27/2019   Normal.   IR GENERIC HISTORICAL  09/19/2016   IR  SINUS/FIST TUBE CHK-NON GI 09/19/2016 JaCorrie MckusickDO [facility]   IR GENERIC HISTORICAL  10/06/2016   IR RADIOLOGIST EVAL & MGMT 10/06/2016 [facility]   IR GENERIC HISTORICAL  10/26/2016   IR RADIOLOGIST EVAL & MGMT 10/26/2016 [facility]   IR GENERIC HISTORICAL  11/03/2016   IR RADIOLOGIST EVAL & MGMT 11/03/2016 WeArdis RowanPA-C [facility]   IR GENERIC HISTORICAL  11/24/2016   IR RADIOLOGIST EVAL & MGMT 11/24/2016 WeArdis RowanPA-C [facility]   IR GENERIC HISTORICAL  12/05/2016   Fistula smaller/improving.  IR SINUS/FIST TUBE CHK-NON GI 12/05/2016 JoSandi MariscalMD [facility]   IR GENERIC HISTORICAL  12/22/2016   IR RADIOLOGIST EVAL & MGMT 12/22/2016 MiGreggory KeenMD [facility]   IR RADIOLOGIST EVAL & MGMT  01/10/2017  IR RADIOLOGIST EVAL & MGMT  02/09/2017   IR SINUS/FIST TUBE CHK-NON GI  03/28/2017   LEFT HEART CATHETERIZATION WITH CORONARY ANGIOGRAM N/A 05/30/2012   Procedure: LEFT HEART CATHETERIZATION WITH CORONARY ANGIOGRAM;  Surgeon: Burnell Blanks, MD;  Location: [facility];  Service: Cardiovascular: Mild, non-obstructive CAD   TRANSTHORACIC ECHOCARDIOGRAM  09/14/2016; 11/2016   08/2016: EF 30-35 %, Akinesis of the mid-apicalanteroseptal myocardium.  Grade I DD.  Mild pulm HTN.  Repeat echo 11/2016, EF 40-45%, no rwma, nl RV fxn, mild TR, PASP 42mHg.     TRANSTHORACIC ECHOCARDIOGRAM  03/07/2017   EF 50-55%. Hypokinesis of the distal septum with overall low normal LV,  systolic function; mild diastolic dysfunction with elevated LV  filling pressure; mildly calcified aortic valve with mild AI; small pericardial effusion    Outpatient Medications Prior to Visit  Medication Sig Dispense Refill   albuterol (PROVENTIL) (2.5 MG/3ML) 0.083% nebulizer solution USE 1 VIAL IN NEBULIZER EVERY 4 HOURS AS NEEDED FOR WHEEZING AND FOR SHORTNESS OF BREATH (Patient taking differently: Take 2.5 mg by nebulization every 4 (four) hours as needed  for wheezing or shortness of breath.) 90 mL 1   albuterol (VENTOLIN HFA) 108 (90 Base) MCG/ACT inhaler INHALE 2 PUFFS BY MOUTH EVERY 6 HOURS AS NEEDED FOR WHEEZING OR SHORTNESS OF BREATH (Patient taking differently: 2 puffs every 6 (six) hours as needed for wheezing.) 18 g 1   aspirin 81 MG EC tablet Take 81 mg by mouth daily.      atorvastatin (LIPITOR) 40 MG tablet Take 1 tablet by mouth once daily (Patient taking differently: Take 40 mg by mouth daily.) 90 tablet 0   buPROPion (WELLBUTRIN XL) 150 MG 24 hr tablet Take 1 tablet by mouth once daily (Patient taking differently: 150 mg daily.) 90 tablet 0   Calcium Carbonate-Vit D-Min (CALTRATE 600+D PLUS MINERALS) 600-800 MG-UNIT TABS Take 1-2 tablets by mouth See admin instructions. Takes 1 tablet in the morning and 2 tablets at night     cetirizine (ZYRTEC) 10 MG tablet Take 10 mg by mouth daily.     escitalopram (LEXAPRO) 20 MG tablet Take 1 tablet by mouth once daily (Patient taking differently: Take 20 mg by mouth daily.) 90 tablet 0   fluticasone (FLOVENT HFA) 220 MCG/ACT inhaler Inhale 2 puffs into the lungs 2 (two) times daily. 12 g 11   ibandronate (BONIVA) 150 MG tablet Take 1 tablet (150 mg total) by mouth every 30 (thirty) days. Take in the morning with a full glass of water, on an empty stomach, and do not take anything else by mouth or lie down for the next 30 min. 3 tablet 3   insulin isophane & regular human (HUMULIN 70/30 KWIKPEN) (70-30) 100 UNIT/ML KwikPen INJECT 4 UNITS SUBCUTANEOUSLY WITH BREAKFAST AND 2 UNITS WITH SUPPER (Patient taking differently: 4 Units 2 (two) times daily.) 15 mL 1   Lancets (ONETOUCH DELICA PLUS [id number] MISC USE 1  TO CHECK GLUCOSE THREE TIMES DAILY 100 each 5   metoprolol tartrate (LOPRESSOR) 25 MG tablet Take 1/2 (one-half) tablet by mouth twice daily (Patient taking differently: Take 12.5 mg by mouth 2 (two) times daily.) 90 tablet 0   Multiple Vitamins-Minerals (MULTIVITAMIN WOMEN PO) Take 1 tablet by  mouth daily.     ONETOUCH ULTRA test strip USE 1 STRIP TO CHECK GLUCOSE THREE TIMES DAILY 100 each 11   pantoprazole (PROTONIX) 40 MG tablet Take 1 tablet by mouth once daily (Patient taking differently: 40 mg daily.) 90  tablet 0   benzonatate (TESSALON) 100 MG capsule Take 1 capsule (100 mg total) by mouth 3 (three) times daily as needed for cough. (Patient not taking: Reported on 10/29/2021) 20 capsule 0   No facility-administered medications prior to visit.    Allergies  Allergen Reactions   Lactose Intolerance (Gi) Diarrhea   Ibuprofen Other (See Comments)    REACTION: Anxiousness, hyperventilates    ROS As per HPI  PE: Vitals with BMI 10/29/2021 10/21/2021 10/20/2021  Height _0  - -  Weight 136 lbs 10 oz - -  BMI 38.10 - -  Systolic 99 175 102  Diastolic 61 60 51  Pulse 96 77 92  02 sat on RA today is 99%  Physical Exam  Gen: Alert, well appearing.  Patient is oriented to person, place, time, and situation. AFFECT: pleasant, lucid thought and speech. HEN:IDPO: no injection, icteris, swelling, or exudate.  EOMI, PERRLA. Mouth: lips without lesion/swelling.  Oral mucosa pink and moist. Oropharynx without erythema, exudate, or swelling.  CV: RRR, no m/r/g.   LUNGS: CTA on inspiration, nonlabored resps, good aeration in all lung fields.  Trace exp wheeze bilat. EXT: no clubbing or cyanosis.  no edema.     LABS:  Last CBC Lab Results  Component Value Date   WBC 9.8 10/19/2021   HGB 11.9 (L) 10/19/2021   HCT 36.4 10/19/2021   MCV 91.9 10/19/2021   MCH 30.1 10/19/2021   RDW 14.3 10/19/2021   PLT 180 10/19/2021   Lab Results  Component Value Date   IRON 44 10/11/2019   TIBC 236 (L) 10/11/2019   FERRITIN 86 24/23/5361   Last metabolic panel Lab Results  Component Value Date   GLUCOSE 195 (H) 10/20/2021   NA 129 (L) 10/20/2021   K 3.9 10/20/2021   CL 93 (L) 10/20/2021   CO2 28 10/20/2021   BUN 16 10/20/2021   CREATININE 0.76 10/20/2021   GFRNONAA >60  10/20/2021   CALCIUM 8.5 (L) 10/20/2021   PHOS 5.4 (H) 10/13/2019   PROT 5.8 (L) 08/03/2021   ALBUMIN 3.6 08/03/2021   LABGLOB 2.7 09/19/2018   AGRATIO 1.6 09/19/2018   BILITOT 0.3 08/03/2021   ALKPHOS 66 08/03/2021   AST 18 08/03/2021   ALT 17 08/03/2021   ANIONGAP 8 10/20/2021   Last lipids Lab Results  Component Value Date   CHOL 130 10/28/2020   HDL 43.00 10/28/2020   LDLCALC 71 10/28/2020   LDLDIRECT 162.0 07/17/2015   TRIG 79.0 10/28/2020   CHOLHDL 3 10/28/2020   Last hemoglobin A1c Lab Results  Component Value Date   HGBA1C 6.9 (H) 08/03/2021   Last thyroid functions Lab Results  Component Value Date   TSH 1.24 04/20/2017   T3TOTAL 94.7 04/20/2017    IMPRESSION AND PLAN:  #1 hospitalization follow-up for UTI, COVID-positive, COPD exacerbation. She has back to baseline health now.  2.  Diabetes, historically good control. Continue 70/30 insulin, 4 units twice a day. Hemoglobin A1c and nonfasting glucose today.  3.  Hypertension, well controlled.  Soft blood pressure today but patient asymptomatic.  Continue Lopressor 12.5 mg twice a day.  4.  Hyperlipidemia: Last LDL a year ago was 71.  Doing well on atorvastatin 40 mg a day. Not fasting today so will try in 3 months to repeat lipid panel.  Hepatic panel today.  #5 chronic renal insufficiency, borderline stage III. She avoids NSAIDs. Electrolytes and creatinine checked today.  An After Visit Summary was printed and given to the  patient.  FOLLOW UP: Return in about 3 months (around 01/27/2022) for routine chronic illness f/u.  Signed:  Crissie Sickles, MD           10/29/2021

## 2021-10-29 NOTE — Addendum Note (Signed)
Addended by: Octaviano Glow on: 10/29/2021 03:03 PM   Modules accepted: Orders

## 2021-10-29 NOTE — Patient Outreach (Signed)
[place] [facility]) Care Management  10/29/2021  [place] Hawbaker [birth year] [id number]   Member admitted to hospital 12/27-12/29, diagnosis of Covid infection.  Call placed to daughter, state they have just arrived at PCP office, will call this care manager back.  Will await call, if no call back will follow up within the next 3-4 business days.  [name], RN, MSN, [place] Manager [phone]

## 2021-10-30 LAB — COMPREHENSIVE METABOLIC PANEL
AG Ratio: 1.5 (calc) (ref 1.0–2.5)
ALT: 10 U/L (ref 6–29)
AST: 14 U/L (ref 10–35)
Albumin: 3.6 g/dL (ref 3.6–5.1)
Alkaline phosphatase (APISO): 59 U/L (ref 37–153)
BUN/Creatinine Ratio: 21 (calc) (ref 6–22)
BUN: 21 mg/dL (ref 7–25)
CO2: 25 mmol/L (ref 20–32)
Calcium: 8.8 mg/dL (ref 8.6–10.4)
Chloride: 96 mmol/L — ABNORMAL LOW (ref 98–110)
Creat: 1.02 mg/dL — ABNORMAL HIGH (ref 0.60–1.00)
Globulin: 2.4 g/dL (calc) (ref 1.9–3.7)
Glucose, Bld: 209 mg/dL — ABNORMAL HIGH (ref 65–99)
Potassium: 4.1 mmol/L (ref 3.5–5.3)
Sodium: 134 mmol/L — ABNORMAL LOW (ref 135–146)
Total Bilirubin: 0.4 mg/dL (ref 0.2–1.2)
Total Protein: 6 g/dL — ABNORMAL LOW (ref 6.1–8.1)

## 2021-10-30 LAB — HEMOGLOBIN A1C
Hgb A1c MFr Bld: 6.5 % of total Hgb — ABNORMAL HIGH (ref <5.7)
Mean Plasma Glucose: 140 mg/dL
eAG (mmol/L): 7.7 mmol/L

## 2021-10-30 LAB — PHOSPHORUS: Phosphorus: 2.9 mg/dL (ref 2.1–4.3)

## 2021-11-01 DIAGNOSIS — E785 Hyperlipidemia, unspecified: Secondary | ICD-10-CM | POA: Diagnosis not present

## 2021-11-01 DIAGNOSIS — E11319 Type 2 diabetes mellitus with unspecified diabetic retinopathy without macular edema: Secondary | ICD-10-CM | POA: Diagnosis not present

## 2021-11-01 DIAGNOSIS — I447 Left bundle-branch block, unspecified: Secondary | ICD-10-CM | POA: Diagnosis not present

## 2021-11-01 DIAGNOSIS — U071 COVID-19: Secondary | ICD-10-CM | POA: Diagnosis not present

## 2021-11-01 DIAGNOSIS — Z85118 Personal history of other malignant neoplasm of bronchus and lung: Secondary | ICD-10-CM | POA: Diagnosis not present

## 2021-11-01 DIAGNOSIS — D631 Anemia in chronic kidney disease: Secondary | ICD-10-CM | POA: Diagnosis not present

## 2021-11-01 DIAGNOSIS — E1165 Type 2 diabetes mellitus with hyperglycemia: Secondary | ICD-10-CM | POA: Diagnosis not present

## 2021-11-01 DIAGNOSIS — J441 Chronic obstructive pulmonary disease with (acute) exacerbation: Secondary | ICD-10-CM | POA: Diagnosis not present

## 2021-11-01 DIAGNOSIS — J302 Other seasonal allergic rhinitis: Secondary | ICD-10-CM | POA: Diagnosis not present

## 2021-11-01 DIAGNOSIS — I2510 Atherosclerotic heart disease of native coronary artery without angina pectoris: Secondary | ICD-10-CM | POA: Diagnosis not present

## 2021-11-01 DIAGNOSIS — D509 Iron deficiency anemia, unspecified: Secondary | ICD-10-CM | POA: Diagnosis not present

## 2021-11-01 DIAGNOSIS — E0590 Thyrotoxicosis, unspecified without thyrotoxic crisis or storm: Secondary | ICD-10-CM | POA: Diagnosis not present

## 2021-11-01 DIAGNOSIS — I509 Heart failure, unspecified: Secondary | ICD-10-CM | POA: Diagnosis not present

## 2021-11-01 DIAGNOSIS — M19042 Primary osteoarthritis, left hand: Secondary | ICD-10-CM | POA: Diagnosis not present

## 2021-11-01 DIAGNOSIS — N183 Chronic kidney disease, stage 3 unspecified: Secondary | ICD-10-CM | POA: Diagnosis not present

## 2021-11-01 DIAGNOSIS — H401130 Primary open-angle glaucoma, bilateral, stage unspecified: Secondary | ICD-10-CM | POA: Diagnosis not present

## 2021-11-01 DIAGNOSIS — K219 Gastro-esophageal reflux disease without esophagitis: Secondary | ICD-10-CM | POA: Diagnosis not present

## 2021-11-01 DIAGNOSIS — I130 Hypertensive heart and chronic kidney disease with heart failure and stage 1 through stage 4 chronic kidney disease, or unspecified chronic kidney disease: Secondary | ICD-10-CM | POA: Diagnosis not present

## 2021-11-01 DIAGNOSIS — E1122 Type 2 diabetes mellitus with diabetic chronic kidney disease: Secondary | ICD-10-CM | POA: Diagnosis not present

## 2021-11-01 DIAGNOSIS — F03A4 Unspecified dementia, mild, with anxiety: Secondary | ICD-10-CM | POA: Diagnosis not present

## 2021-11-01 DIAGNOSIS — M19041 Primary osteoarthritis, right hand: Secondary | ICD-10-CM | POA: Diagnosis not present

## 2021-11-01 DIAGNOSIS — E871 Hypo-osmolality and hyponatremia: Secondary | ICD-10-CM | POA: Diagnosis not present

## 2021-11-01 DIAGNOSIS — F03A3 Unspecified dementia, mild, with mood disturbance: Secondary | ICD-10-CM | POA: Diagnosis not present

## 2021-11-01 DIAGNOSIS — F32A Depression, unspecified: Secondary | ICD-10-CM | POA: Diagnosis not present

## 2021-11-03 ENCOUNTER — Other Ambulatory Visit: Payer: Self-pay | Admitting: *Deleted

## 2021-11-03 DIAGNOSIS — J302 Other seasonal allergic rhinitis: Secondary | ICD-10-CM | POA: Diagnosis not present

## 2021-11-03 DIAGNOSIS — I130 Hypertensive heart and chronic kidney disease with heart failure and stage 1 through stage 4 chronic kidney disease, or unspecified chronic kidney disease: Secondary | ICD-10-CM | POA: Diagnosis not present

## 2021-11-03 DIAGNOSIS — J441 Chronic obstructive pulmonary disease with (acute) exacerbation: Secondary | ICD-10-CM | POA: Diagnosis not present

## 2021-11-03 DIAGNOSIS — D509 Iron deficiency anemia, unspecified: Secondary | ICD-10-CM | POA: Diagnosis not present

## 2021-11-03 DIAGNOSIS — I509 Heart failure, unspecified: Secondary | ICD-10-CM | POA: Diagnosis not present

## 2021-11-03 DIAGNOSIS — N183 Chronic kidney disease, stage 3 unspecified: Secondary | ICD-10-CM | POA: Diagnosis not present

## 2021-11-03 DIAGNOSIS — F03A3 Unspecified dementia, mild, with mood disturbance: Secondary | ICD-10-CM | POA: Diagnosis not present

## 2021-11-03 DIAGNOSIS — E1165 Type 2 diabetes mellitus with hyperglycemia: Secondary | ICD-10-CM | POA: Diagnosis not present

## 2021-11-03 DIAGNOSIS — K219 Gastro-esophageal reflux disease without esophagitis: Secondary | ICD-10-CM | POA: Diagnosis not present

## 2021-11-03 DIAGNOSIS — H401130 Primary open-angle glaucoma, bilateral, stage unspecified: Secondary | ICD-10-CM | POA: Diagnosis not present

## 2021-11-03 DIAGNOSIS — I447 Left bundle-branch block, unspecified: Secondary | ICD-10-CM | POA: Diagnosis not present

## 2021-11-03 DIAGNOSIS — E11319 Type 2 diabetes mellitus with unspecified diabetic retinopathy without macular edema: Secondary | ICD-10-CM | POA: Diagnosis not present

## 2021-11-03 DIAGNOSIS — M19041 Primary osteoarthritis, right hand: Secondary | ICD-10-CM | POA: Diagnosis not present

## 2021-11-03 DIAGNOSIS — F03A4 Unspecified dementia, mild, with anxiety: Secondary | ICD-10-CM | POA: Diagnosis not present

## 2021-11-03 DIAGNOSIS — D631 Anemia in chronic kidney disease: Secondary | ICD-10-CM | POA: Diagnosis not present

## 2021-11-03 DIAGNOSIS — E785 Hyperlipidemia, unspecified: Secondary | ICD-10-CM | POA: Diagnosis not present

## 2021-11-03 DIAGNOSIS — I2510 Atherosclerotic heart disease of native coronary artery without angina pectoris: Secondary | ICD-10-CM | POA: Diagnosis not present

## 2021-11-03 DIAGNOSIS — E871 Hypo-osmolality and hyponatremia: Secondary | ICD-10-CM | POA: Diagnosis not present

## 2021-11-03 DIAGNOSIS — E0590 Thyrotoxicosis, unspecified without thyrotoxic crisis or storm: Secondary | ICD-10-CM | POA: Diagnosis not present

## 2021-11-03 DIAGNOSIS — F32A Depression, unspecified: Secondary | ICD-10-CM | POA: Diagnosis not present

## 2021-11-03 DIAGNOSIS — E1122 Type 2 diabetes mellitus with diabetic chronic kidney disease: Secondary | ICD-10-CM | POA: Diagnosis not present

## 2021-11-03 DIAGNOSIS — M19042 Primary osteoarthritis, left hand: Secondary | ICD-10-CM | POA: Diagnosis not present

## 2021-11-03 DIAGNOSIS — Z85118 Personal history of other malignant neoplasm of bronchus and lung: Secondary | ICD-10-CM | POA: Diagnosis not present

## 2021-11-03 DIAGNOSIS — U071 COVID-19: Secondary | ICD-10-CM | POA: Diagnosis not present

## 2021-11-03 NOTE — Patient Outreach (Signed)
[place] [facility]) Care Management  [place]  11/03/2021   [name] 08/04/1946 [id number]   Outreach attempt #2, successful to daughter.  Denies any urgent concerns, encouraged to contact this care manager with questions.    Encounter Medications:  Outpatient Encounter Medications as of 11/03/2021  Medication Sig   albuterol (PROVENTIL) (2.5 MG/3ML) 0.083% nebulizer solution USE 1 VIAL IN NEBULIZER EVERY 4 HOURS AS NEEDED FOR WHEEZING AND FOR SHORTNESS OF BREATH (Patient taking differently: Take 2.5 mg by nebulization every 4 (four) hours as needed for wheezing or shortness of breath.)   albuterol (VENTOLIN HFA) 108 (90 Base) MCG/ACT inhaler INHALE 2 PUFFS BY MOUTH EVERY 6 HOURS AS NEEDED FOR WHEEZING OR SHORTNESS OF BREATH (Patient taking differently: 2 puffs every 6 (six) hours as needed for wheezing.)   aspirin 81 MG EC tablet Take 81 mg by mouth daily.    atorvastatin (LIPITOR) 40 MG tablet Take 1 tablet by mouth once daily (Patient taking differently: Take 40 mg by mouth daily.)   buPROPion (WELLBUTRIN XL) 150 MG 24 hr tablet Take 1 tablet by mouth once daily (Patient taking differently: 150 mg daily.)   Calcium Carbonate-Vit D-Min (CALTRATE 600+D PLUS MINERALS) 600-800 MG-UNIT TABS Take 1-2 tablets by mouth See admin instructions. Takes 1 tablet in the morning and 2 tablets at night   cetirizine (ZYRTEC) 10 MG tablet Take 10 mg by mouth daily.   escitalopram (LEXAPRO) 20 MG tablet Take 1 tablet by mouth once daily (Patient taking differently: Take 20 mg by mouth daily.)   fluticasone (FLOVENT HFA) 220 MCG/ACT inhaler Inhale 2 puffs into the lungs 2 (two) times daily.   ibandronate (BONIVA) 150 MG tablet Take 1 tablet (150 mg total) by mouth every 30 (thirty) days. Take in the morning with a full glass of water, on an empty stomach, and do not take anything else by mouth or lie down for the next 30 min.   insulin isophane & regular human (HUMULIN 70/30  KWIKPEN) (70-30) 100 UNIT/ML KwikPen INJECT 4 UNITS SUBCUTANEOUSLY WITH BREAKFAST AND 2 UNITS WITH SUPPER (Patient taking differently: 4 Units 2 (two) times daily.)   Lancets (ONETOUCH DELICA PLUS [id number]) MISC USE 1  TO CHECK GLUCOSE THREE TIMES DAILY   metoprolol tartrate (LOPRESSOR) 25 MG tablet Take 1/2 (one-half) tablet by mouth twice daily (Patient taking differently: Take 12.5 mg by mouth 2 (two) times daily.)   Multiple Vitamins-Minerals (MULTIVITAMIN WOMEN PO) Take 1 tablet by mouth daily.   ONETOUCH ULTRA test strip USE 1 STRIP TO CHECK GLUCOSE THREE TIMES DAILY   pantoprazole (PROTONIX) 40 MG tablet Take 1 tablet by mouth once daily (Patient taking differently: 40 mg daily.)   No facility-administered encounter medications on file as of 11/03/2021.    Functional Status:  In your present state of health, do you have any difficulty performing the following activities: 10/20/2021 12/16/2020  Hearing? Y N  Vision? N N  Difficulty concentrating or making decisions? N Y  Comment - memory loss to to radiation of the brain-per daughter  Walking or climbing stairs? Y Y  Dressing or bathing? Y N  Doing errands, shopping? [name]  Preparing Food and eating ? - Y  Using the Toilet? - N  In the past six months, have you accidently leaked urine? - Y  Comment - wears depends  Do you have problems with loss of bowel control? - N  Managing your Medications? - Y  Managing your Finances? - Y  Housekeeping or managing your Housekeeping? - Y  Some recent data might be hidden    Fall/Depression Screening: Fall Risk  08/02/2021 04/19/2021 03/23/2021  Falls in the past year? 0 1 0  Comment - - -  Number falls in past yr: 0 0 0  Comment - - -  Injury with Fall? 0 1 0  Risk Factor Category  - - -  Risk for fall due to : - History of fall(s);Impaired balance/gait Impaired balance/gait  Risk for fall due to: Comment - - -  Follow up Falls evaluation completed Falls evaluation completed -  Comment -  - -   PHQ 2/9 Scores 08/02/2021 03/23/2021 12/16/2020 08/29/2019 08/09/2019 04/19/2019 12/14/2018  PHQ - 2 Score 0 - 0 1 3 3 1   PHQ- 9 Score - - - 9 9 8  -  Exception Documentation - Other- indicate reason in comment box - - - - -    Assessment:   Care Plan Care Plan : RNCM Plan of Care  Updates made by [name], RN since 11/03/2021 12:00 AM     Problem: Chronic care management of multiple chronic conditions (CHF, DM, COPD)   Priority: High     Long-Range Goal: Ability for member and family to adequately manage chronic conditions (CHF, DM, COPD)   Start Date: 08/16/2021  Expected End Date: 02/12/2022  This Visit's Progress: On track  Recent Progress: On track  Priority: High  Note:   Current Barriers:  Knowledge Deficits related to plan of care for management of CHF, COPD, and DMII Chronic Disease Management support and education needs related to CHF, COPD, and DMII  RNCM Clinical Goal(s):  Patient will verbalize understanding of plan for management of CHF, COPD, and DMII take all medications exactly as prescribed and will call provider for medication related questions attend all scheduled medical appointments: PCP continue to work with RN Care Manager to address care management and care coordination needs related to  CHF, COPD, and DMII work with [facility]  to increase strength and mobility through PT.  Noted per chart, HH sessions have been approved for extension demonstrate a decrease in CHF, COPD, and DMII exacerbations    through collaboration with RN Care manager, provider, and care team.   Interventions: Inter-disciplinary care team collaboration (see longitudinal plan of care) Evaluation of current treatment plan related to  self management and patient's adherence to plan as established by provider   Diabetes Interventions: Assessed patient's understanding of A1c goal: <6.5% Provided education to patient about basic DM disease process; Discussed plans with patient for  ongoing care management follow up and provided patient with direct contact information for care management team; Advised patient, providing education and rationale, to check cbg daily and record, calling PCP for findings outside established parameters; Screening for signs and symptoms of depression related to chronic disease state;  Lab Results  Component Value Date   HGBA1C 6.9 (H) 08/03/2021   COPD Interventions:   Provided patient with basic written and verbal COPD education on self care/management/and exacerbation prevention; Advised patient to track and manage COPD triggers;  Advised patient to self assesses COPD action plan zone and make appointment with provider if in the yellow zone for 48 hours without improvement; Discussed the importance of adequate rest and management of fatigue with COPD; Screening for signs and symptoms of depression related to chronic disease state;  Advised daughter to call PCP office as long nodule was noted on most recent CXR, recommended to have CT scan, not  scheduled yet.  She was provided antibiotics and steroids recently for pneumonia, per daughter, this has improved.  Heart Failure Interventions: Basic overview and discussion of pathophysiology of Heart Failure reviewed; Provided education on low sodium diet; Provided education about placing scale on hard, flat surface; Advised patient to weigh each morning after emptying bladder; Discussed the importance of keeping all appointments with provider;  Patient Goals/Self-Care Activities: Patient will self administer medications as prescribed Patient will attend all scheduled provider appointments Patient will continue to perform ADL's independently  Follow Up Plan:  Telephone follow up appointment with care management team member scheduled for:  12/02/2021   Update 11/21 - Per daughter, member is "ok."  State she has decreased strength in the last couple weeks as home health PT has completed.  She was  told that they would request an extension but has not heard anything back from office.  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  She had chest CT done last week, waiting call from PCP office for follow up and test results.  Call placed to [facility] to follow up on renewal of PT order, state they do not have an open case for member currently and would need new orders.  Call placed to PCP office, message left requesting call back and/or new orders for home health PT.     Update 12/9 - Daughter report member is about the same.  She has not been able to get her to consistently do strengthening exercises independently at home.  Advised that this care manager was able to speak with Vevelyn Royals at PCP office.  They attempted to provide referral to [facility] however they were told that insurance would not approve it as member has not had a change in status, such as repeat hospital admission.  Daughter verbalizes understanding, state she is off the next 2 weeks and will work with her on exercises provided by PT.  Hoping to help member improve strength, if member declines will speak with PCP and have reassessment done.    Update 1/11 - Spoke with daughter, state member was admitted to hospital for 2 days since last outreach, diagnosed with Covid and UTI.  Report member became weak and unable to stand/walk, increased confusion.  Was treated with antibiotics, steroids, and antiviral, state member is much better now.  She has increased in strength and back to her mental baseline.  She now has home health for PT and OT, will have next visit today.  Was also seen by PCP last week for follow up, no changes in plan of care, will revisit in 3 months.          Plan:  Follow-up: Patient agrees to Care Plan and Follow-up. Follow-up in 1 month(s)  [name], RN, MSN, [place] [phone]

## 2021-11-10 DIAGNOSIS — I130 Hypertensive heart and chronic kidney disease with heart failure and stage 1 through stage 4 chronic kidney disease, or unspecified chronic kidney disease: Secondary | ICD-10-CM | POA: Diagnosis not present

## 2021-11-10 DIAGNOSIS — M19041 Primary osteoarthritis, right hand: Secondary | ICD-10-CM | POA: Diagnosis not present

## 2021-11-10 DIAGNOSIS — K219 Gastro-esophageal reflux disease without esophagitis: Secondary | ICD-10-CM | POA: Diagnosis not present

## 2021-11-10 DIAGNOSIS — E1122 Type 2 diabetes mellitus with diabetic chronic kidney disease: Secondary | ICD-10-CM | POA: Diagnosis not present

## 2021-11-10 DIAGNOSIS — D509 Iron deficiency anemia, unspecified: Secondary | ICD-10-CM | POA: Diagnosis not present

## 2021-11-10 DIAGNOSIS — I2510 Atherosclerotic heart disease of native coronary artery without angina pectoris: Secondary | ICD-10-CM | POA: Diagnosis not present

## 2021-11-10 DIAGNOSIS — E1165 Type 2 diabetes mellitus with hyperglycemia: Secondary | ICD-10-CM | POA: Diagnosis not present

## 2021-11-10 DIAGNOSIS — F32A Depression, unspecified: Secondary | ICD-10-CM | POA: Diagnosis not present

## 2021-11-10 DIAGNOSIS — E785 Hyperlipidemia, unspecified: Secondary | ICD-10-CM | POA: Diagnosis not present

## 2021-11-10 DIAGNOSIS — M19042 Primary osteoarthritis, left hand: Secondary | ICD-10-CM | POA: Diagnosis not present

## 2021-11-10 DIAGNOSIS — E0590 Thyrotoxicosis, unspecified without thyrotoxic crisis or storm: Secondary | ICD-10-CM | POA: Diagnosis not present

## 2021-11-10 DIAGNOSIS — J441 Chronic obstructive pulmonary disease with (acute) exacerbation: Secondary | ICD-10-CM | POA: Diagnosis not present

## 2021-11-10 DIAGNOSIS — J302 Other seasonal allergic rhinitis: Secondary | ICD-10-CM | POA: Diagnosis not present

## 2021-11-10 DIAGNOSIS — E11319 Type 2 diabetes mellitus with unspecified diabetic retinopathy without macular edema: Secondary | ICD-10-CM | POA: Diagnosis not present

## 2021-11-10 DIAGNOSIS — U071 COVID-19: Secondary | ICD-10-CM | POA: Diagnosis not present

## 2021-11-10 DIAGNOSIS — F03A4 Unspecified dementia, mild, with anxiety: Secondary | ICD-10-CM | POA: Diagnosis not present

## 2021-11-10 DIAGNOSIS — H401130 Primary open-angle glaucoma, bilateral, stage unspecified: Secondary | ICD-10-CM | POA: Diagnosis not present

## 2021-11-10 DIAGNOSIS — Z85118 Personal history of other malignant neoplasm of bronchus and lung: Secondary | ICD-10-CM | POA: Diagnosis not present

## 2021-11-10 DIAGNOSIS — D631 Anemia in chronic kidney disease: Secondary | ICD-10-CM | POA: Diagnosis not present

## 2021-11-10 DIAGNOSIS — N183 Chronic kidney disease, stage 3 unspecified: Secondary | ICD-10-CM | POA: Diagnosis not present

## 2021-11-10 DIAGNOSIS — F03A3 Unspecified dementia, mild, with mood disturbance: Secondary | ICD-10-CM | POA: Diagnosis not present

## 2021-11-10 DIAGNOSIS — E871 Hypo-osmolality and hyponatremia: Secondary | ICD-10-CM | POA: Diagnosis not present

## 2021-11-10 DIAGNOSIS — I509 Heart failure, unspecified: Secondary | ICD-10-CM | POA: Diagnosis not present

## 2021-11-10 DIAGNOSIS — I447 Left bundle-branch block, unspecified: Secondary | ICD-10-CM | POA: Diagnosis not present

## 2021-11-12 DIAGNOSIS — F32A Depression, unspecified: Secondary | ICD-10-CM | POA: Diagnosis not present

## 2021-11-12 DIAGNOSIS — M19042 Primary osteoarthritis, left hand: Secondary | ICD-10-CM | POA: Diagnosis not present

## 2021-11-12 DIAGNOSIS — E871 Hypo-osmolality and hyponatremia: Secondary | ICD-10-CM | POA: Diagnosis not present

## 2021-11-12 DIAGNOSIS — E1122 Type 2 diabetes mellitus with diabetic chronic kidney disease: Secondary | ICD-10-CM | POA: Diagnosis not present

## 2021-11-12 DIAGNOSIS — M19041 Primary osteoarthritis, right hand: Secondary | ICD-10-CM | POA: Diagnosis not present

## 2021-11-12 DIAGNOSIS — F03A4 Unspecified dementia, mild, with anxiety: Secondary | ICD-10-CM | POA: Diagnosis not present

## 2021-11-12 DIAGNOSIS — U071 COVID-19: Secondary | ICD-10-CM | POA: Diagnosis not present

## 2021-11-12 DIAGNOSIS — N183 Chronic kidney disease, stage 3 unspecified: Secondary | ICD-10-CM | POA: Diagnosis not present

## 2021-11-12 DIAGNOSIS — E785 Hyperlipidemia, unspecified: Secondary | ICD-10-CM | POA: Diagnosis not present

## 2021-11-12 DIAGNOSIS — K219 Gastro-esophageal reflux disease without esophagitis: Secondary | ICD-10-CM | POA: Diagnosis not present

## 2021-11-12 DIAGNOSIS — F03A3 Unspecified dementia, mild, with mood disturbance: Secondary | ICD-10-CM | POA: Diagnosis not present

## 2021-11-12 DIAGNOSIS — I130 Hypertensive heart and chronic kidney disease with heart failure and stage 1 through stage 4 chronic kidney disease, or unspecified chronic kidney disease: Secondary | ICD-10-CM | POA: Diagnosis not present

## 2021-11-12 DIAGNOSIS — D509 Iron deficiency anemia, unspecified: Secondary | ICD-10-CM | POA: Diagnosis not present

## 2021-11-12 DIAGNOSIS — E0590 Thyrotoxicosis, unspecified without thyrotoxic crisis or storm: Secondary | ICD-10-CM | POA: Diagnosis not present

## 2021-11-12 DIAGNOSIS — I447 Left bundle-branch block, unspecified: Secondary | ICD-10-CM | POA: Diagnosis not present

## 2021-11-12 DIAGNOSIS — Z85118 Personal history of other malignant neoplasm of bronchus and lung: Secondary | ICD-10-CM | POA: Diagnosis not present

## 2021-11-12 DIAGNOSIS — I2510 Atherosclerotic heart disease of native coronary artery without angina pectoris: Secondary | ICD-10-CM | POA: Diagnosis not present

## 2021-11-12 DIAGNOSIS — J441 Chronic obstructive pulmonary disease with (acute) exacerbation: Secondary | ICD-10-CM | POA: Diagnosis not present

## 2021-11-12 DIAGNOSIS — D631 Anemia in chronic kidney disease: Secondary | ICD-10-CM | POA: Diagnosis not present

## 2021-11-12 DIAGNOSIS — H401130 Primary open-angle glaucoma, bilateral, stage unspecified: Secondary | ICD-10-CM | POA: Diagnosis not present

## 2021-11-12 DIAGNOSIS — J302 Other seasonal allergic rhinitis: Secondary | ICD-10-CM | POA: Diagnosis not present

## 2021-11-12 DIAGNOSIS — E1165 Type 2 diabetes mellitus with hyperglycemia: Secondary | ICD-10-CM | POA: Diagnosis not present

## 2021-11-12 DIAGNOSIS — I509 Heart failure, unspecified: Secondary | ICD-10-CM | POA: Diagnosis not present

## 2021-11-12 DIAGNOSIS — E11319 Type 2 diabetes mellitus with unspecified diabetic retinopathy without macular edema: Secondary | ICD-10-CM | POA: Diagnosis not present

## 2021-11-15 DIAGNOSIS — U071 COVID-19: Secondary | ICD-10-CM | POA: Diagnosis not present

## 2021-11-15 DIAGNOSIS — N183 Chronic kidney disease, stage 3 unspecified: Secondary | ICD-10-CM | POA: Diagnosis not present

## 2021-11-15 DIAGNOSIS — E1122 Type 2 diabetes mellitus with diabetic chronic kidney disease: Secondary | ICD-10-CM | POA: Diagnosis not present

## 2021-11-15 DIAGNOSIS — J302 Other seasonal allergic rhinitis: Secondary | ICD-10-CM | POA: Diagnosis not present

## 2021-11-15 DIAGNOSIS — E11319 Type 2 diabetes mellitus with unspecified diabetic retinopathy without macular edema: Secondary | ICD-10-CM | POA: Diagnosis not present

## 2021-11-15 DIAGNOSIS — E871 Hypo-osmolality and hyponatremia: Secondary | ICD-10-CM | POA: Diagnosis not present

## 2021-11-15 DIAGNOSIS — I509 Heart failure, unspecified: Secondary | ICD-10-CM | POA: Diagnosis not present

## 2021-11-15 DIAGNOSIS — I447 Left bundle-branch block, unspecified: Secondary | ICD-10-CM | POA: Diagnosis not present

## 2021-11-15 DIAGNOSIS — I130 Hypertensive heart and chronic kidney disease with heart failure and stage 1 through stage 4 chronic kidney disease, or unspecified chronic kidney disease: Secondary | ICD-10-CM | POA: Diagnosis not present

## 2021-11-15 DIAGNOSIS — E785 Hyperlipidemia, unspecified: Secondary | ICD-10-CM | POA: Diagnosis not present

## 2021-11-15 DIAGNOSIS — J441 Chronic obstructive pulmonary disease with (acute) exacerbation: Secondary | ICD-10-CM | POA: Diagnosis not present

## 2021-11-15 DIAGNOSIS — F03A4 Unspecified dementia, mild, with anxiety: Secondary | ICD-10-CM | POA: Diagnosis not present

## 2021-11-15 DIAGNOSIS — H401130 Primary open-angle glaucoma, bilateral, stage unspecified: Secondary | ICD-10-CM | POA: Diagnosis not present

## 2021-11-15 DIAGNOSIS — M19042 Primary osteoarthritis, left hand: Secondary | ICD-10-CM | POA: Diagnosis not present

## 2021-11-15 DIAGNOSIS — K219 Gastro-esophageal reflux disease without esophagitis: Secondary | ICD-10-CM | POA: Diagnosis not present

## 2021-11-15 DIAGNOSIS — M19041 Primary osteoarthritis, right hand: Secondary | ICD-10-CM | POA: Diagnosis not present

## 2021-11-15 DIAGNOSIS — Z85118 Personal history of other malignant neoplasm of bronchus and lung: Secondary | ICD-10-CM | POA: Diagnosis not present

## 2021-11-15 DIAGNOSIS — E0590 Thyrotoxicosis, unspecified without thyrotoxic crisis or storm: Secondary | ICD-10-CM | POA: Diagnosis not present

## 2021-11-15 DIAGNOSIS — I2510 Atherosclerotic heart disease of native coronary artery without angina pectoris: Secondary | ICD-10-CM | POA: Diagnosis not present

## 2021-11-15 DIAGNOSIS — F03A3 Unspecified dementia, mild, with mood disturbance: Secondary | ICD-10-CM | POA: Diagnosis not present

## 2021-11-15 DIAGNOSIS — D631 Anemia in chronic kidney disease: Secondary | ICD-10-CM | POA: Diagnosis not present

## 2021-11-15 DIAGNOSIS — F32A Depression, unspecified: Secondary | ICD-10-CM | POA: Diagnosis not present

## 2021-11-15 DIAGNOSIS — D509 Iron deficiency anemia, unspecified: Secondary | ICD-10-CM | POA: Diagnosis not present

## 2021-11-15 DIAGNOSIS — E1165 Type 2 diabetes mellitus with hyperglycemia: Secondary | ICD-10-CM | POA: Diagnosis not present

## 2021-11-17 DIAGNOSIS — E1165 Type 2 diabetes mellitus with hyperglycemia: Secondary | ICD-10-CM | POA: Diagnosis not present

## 2021-11-17 DIAGNOSIS — N183 Chronic kidney disease, stage 3 unspecified: Secondary | ICD-10-CM | POA: Diagnosis not present

## 2021-11-17 DIAGNOSIS — D509 Iron deficiency anemia, unspecified: Secondary | ICD-10-CM | POA: Diagnosis not present

## 2021-11-17 DIAGNOSIS — U071 COVID-19: Secondary | ICD-10-CM | POA: Diagnosis not present

## 2021-11-17 DIAGNOSIS — E0590 Thyrotoxicosis, unspecified without thyrotoxic crisis or storm: Secondary | ICD-10-CM | POA: Diagnosis not present

## 2021-11-17 DIAGNOSIS — M19041 Primary osteoarthritis, right hand: Secondary | ICD-10-CM | POA: Diagnosis not present

## 2021-11-17 DIAGNOSIS — E11319 Type 2 diabetes mellitus with unspecified diabetic retinopathy without macular edema: Secondary | ICD-10-CM | POA: Diagnosis not present

## 2021-11-17 DIAGNOSIS — F03A4 Unspecified dementia, mild, with anxiety: Secondary | ICD-10-CM | POA: Diagnosis not present

## 2021-11-17 DIAGNOSIS — H401130 Primary open-angle glaucoma, bilateral, stage unspecified: Secondary | ICD-10-CM | POA: Diagnosis not present

## 2021-11-17 DIAGNOSIS — E1122 Type 2 diabetes mellitus with diabetic chronic kidney disease: Secondary | ICD-10-CM | POA: Diagnosis not present

## 2021-11-17 DIAGNOSIS — I2510 Atherosclerotic heart disease of native coronary artery without angina pectoris: Secondary | ICD-10-CM | POA: Diagnosis not present

## 2021-11-17 DIAGNOSIS — F03A3 Unspecified dementia, mild, with mood disturbance: Secondary | ICD-10-CM | POA: Diagnosis not present

## 2021-11-17 DIAGNOSIS — J302 Other seasonal allergic rhinitis: Secondary | ICD-10-CM | POA: Diagnosis not present

## 2021-11-17 DIAGNOSIS — E871 Hypo-osmolality and hyponatremia: Secondary | ICD-10-CM | POA: Diagnosis not present

## 2021-11-17 DIAGNOSIS — I509 Heart failure, unspecified: Secondary | ICD-10-CM | POA: Diagnosis not present

## 2021-11-17 DIAGNOSIS — I130 Hypertensive heart and chronic kidney disease with heart failure and stage 1 through stage 4 chronic kidney disease, or unspecified chronic kidney disease: Secondary | ICD-10-CM | POA: Diagnosis not present

## 2021-11-17 DIAGNOSIS — E785 Hyperlipidemia, unspecified: Secondary | ICD-10-CM | POA: Diagnosis not present

## 2021-11-17 DIAGNOSIS — F32A Depression, unspecified: Secondary | ICD-10-CM | POA: Diagnosis not present

## 2021-11-17 DIAGNOSIS — Z85118 Personal history of other malignant neoplasm of bronchus and lung: Secondary | ICD-10-CM | POA: Diagnosis not present

## 2021-11-17 DIAGNOSIS — J441 Chronic obstructive pulmonary disease with (acute) exacerbation: Secondary | ICD-10-CM | POA: Diagnosis not present

## 2021-11-17 DIAGNOSIS — I447 Left bundle-branch block, unspecified: Secondary | ICD-10-CM | POA: Diagnosis not present

## 2021-11-17 DIAGNOSIS — D631 Anemia in chronic kidney disease: Secondary | ICD-10-CM | POA: Diagnosis not present

## 2021-11-17 DIAGNOSIS — M19042 Primary osteoarthritis, left hand: Secondary | ICD-10-CM | POA: Diagnosis not present

## 2021-11-17 DIAGNOSIS — K219 Gastro-esophageal reflux disease without esophagitis: Secondary | ICD-10-CM | POA: Diagnosis not present

## 2021-11-18 ENCOUNTER — Telehealth: Payer: Self-pay | Admitting: Family Medicine

## 2021-11-18 NOTE — Telephone Encounter (Signed)
[name] (home health) called to inform us that pt fell 3 days ago. Pt remained conscious & scrapped her left knee. [name] direct phone number: [phone]

## 2021-11-18 NOTE — Telephone Encounter (Signed)
FYI

## 2021-11-24 DIAGNOSIS — E11319 Type 2 diabetes mellitus with unspecified diabetic retinopathy without macular edema: Secondary | ICD-10-CM | POA: Diagnosis not present

## 2021-11-24 DIAGNOSIS — N183 Chronic kidney disease, stage 3 unspecified: Secondary | ICD-10-CM | POA: Diagnosis not present

## 2021-11-24 DIAGNOSIS — D631 Anemia in chronic kidney disease: Secondary | ICD-10-CM | POA: Diagnosis not present

## 2021-11-24 DIAGNOSIS — I130 Hypertensive heart and chronic kidney disease with heart failure and stage 1 through stage 4 chronic kidney disease, or unspecified chronic kidney disease: Secondary | ICD-10-CM | POA: Diagnosis not present

## 2021-11-24 DIAGNOSIS — E1122 Type 2 diabetes mellitus with diabetic chronic kidney disease: Secondary | ICD-10-CM | POA: Diagnosis not present

## 2021-11-24 DIAGNOSIS — F32A Depression, unspecified: Secondary | ICD-10-CM | POA: Diagnosis not present

## 2021-11-24 DIAGNOSIS — E0590 Thyrotoxicosis, unspecified without thyrotoxic crisis or storm: Secondary | ICD-10-CM | POA: Diagnosis not present

## 2021-11-24 DIAGNOSIS — E871 Hypo-osmolality and hyponatremia: Secondary | ICD-10-CM | POA: Diagnosis not present

## 2021-11-24 DIAGNOSIS — J302 Other seasonal allergic rhinitis: Secondary | ICD-10-CM | POA: Diagnosis not present

## 2021-11-24 DIAGNOSIS — H401130 Primary open-angle glaucoma, bilateral, stage unspecified: Secondary | ICD-10-CM | POA: Diagnosis not present

## 2021-11-24 DIAGNOSIS — K219 Gastro-esophageal reflux disease without esophagitis: Secondary | ICD-10-CM | POA: Diagnosis not present

## 2021-11-24 DIAGNOSIS — I447 Left bundle-branch block, unspecified: Secondary | ICD-10-CM | POA: Diagnosis not present

## 2021-11-24 DIAGNOSIS — J441 Chronic obstructive pulmonary disease with (acute) exacerbation: Secondary | ICD-10-CM | POA: Diagnosis not present

## 2021-11-24 DIAGNOSIS — Z85118 Personal history of other malignant neoplasm of bronchus and lung: Secondary | ICD-10-CM | POA: Diagnosis not present

## 2021-11-24 DIAGNOSIS — M19042 Primary osteoarthritis, left hand: Secondary | ICD-10-CM | POA: Diagnosis not present

## 2021-11-24 DIAGNOSIS — F03A3 Unspecified dementia, mild, with mood disturbance: Secondary | ICD-10-CM | POA: Diagnosis not present

## 2021-11-24 DIAGNOSIS — I2510 Atherosclerotic heart disease of native coronary artery without angina pectoris: Secondary | ICD-10-CM | POA: Diagnosis not present

## 2021-11-24 DIAGNOSIS — E1165 Type 2 diabetes mellitus with hyperglycemia: Secondary | ICD-10-CM | POA: Diagnosis not present

## 2021-11-24 DIAGNOSIS — F03A4 Unspecified dementia, mild, with anxiety: Secondary | ICD-10-CM | POA: Diagnosis not present

## 2021-11-24 DIAGNOSIS — D509 Iron deficiency anemia, unspecified: Secondary | ICD-10-CM | POA: Diagnosis not present

## 2021-11-24 DIAGNOSIS — M19041 Primary osteoarthritis, right hand: Secondary | ICD-10-CM | POA: Diagnosis not present

## 2021-11-24 DIAGNOSIS — U071 COVID-19: Secondary | ICD-10-CM | POA: Diagnosis not present

## 2021-11-24 DIAGNOSIS — I509 Heart failure, unspecified: Secondary | ICD-10-CM | POA: Diagnosis not present

## 2021-11-24 DIAGNOSIS — E785 Hyperlipidemia, unspecified: Secondary | ICD-10-CM | POA: Diagnosis not present

## 2021-11-26 DIAGNOSIS — I447 Left bundle-branch block, unspecified: Secondary | ICD-10-CM | POA: Diagnosis not present

## 2021-11-26 DIAGNOSIS — H401130 Primary open-angle glaucoma, bilateral, stage unspecified: Secondary | ICD-10-CM | POA: Diagnosis not present

## 2021-11-26 DIAGNOSIS — E11319 Type 2 diabetes mellitus with unspecified diabetic retinopathy without macular edema: Secondary | ICD-10-CM | POA: Diagnosis not present

## 2021-11-26 DIAGNOSIS — J302 Other seasonal allergic rhinitis: Secondary | ICD-10-CM | POA: Diagnosis not present

## 2021-11-26 DIAGNOSIS — F03A3 Unspecified dementia, mild, with mood disturbance: Secondary | ICD-10-CM | POA: Diagnosis not present

## 2021-11-26 DIAGNOSIS — F03A4 Unspecified dementia, mild, with anxiety: Secondary | ICD-10-CM | POA: Diagnosis not present

## 2021-11-26 DIAGNOSIS — I130 Hypertensive heart and chronic kidney disease with heart failure and stage 1 through stage 4 chronic kidney disease, or unspecified chronic kidney disease: Secondary | ICD-10-CM | POA: Diagnosis not present

## 2021-11-26 DIAGNOSIS — M19042 Primary osteoarthritis, left hand: Secondary | ICD-10-CM | POA: Diagnosis not present

## 2021-11-26 DIAGNOSIS — F32A Depression, unspecified: Secondary | ICD-10-CM | POA: Diagnosis not present

## 2021-11-26 DIAGNOSIS — I509 Heart failure, unspecified: Secondary | ICD-10-CM | POA: Diagnosis not present

## 2021-11-26 DIAGNOSIS — Z85118 Personal history of other malignant neoplasm of bronchus and lung: Secondary | ICD-10-CM | POA: Diagnosis not present

## 2021-11-26 DIAGNOSIS — K219 Gastro-esophageal reflux disease without esophagitis: Secondary | ICD-10-CM | POA: Diagnosis not present

## 2021-11-26 DIAGNOSIS — E0590 Thyrotoxicosis, unspecified without thyrotoxic crisis or storm: Secondary | ICD-10-CM | POA: Diagnosis not present

## 2021-11-26 DIAGNOSIS — M19041 Primary osteoarthritis, right hand: Secondary | ICD-10-CM | POA: Diagnosis not present

## 2021-11-26 DIAGNOSIS — E871 Hypo-osmolality and hyponatremia: Secondary | ICD-10-CM | POA: Diagnosis not present

## 2021-11-26 DIAGNOSIS — E1165 Type 2 diabetes mellitus with hyperglycemia: Secondary | ICD-10-CM | POA: Diagnosis not present

## 2021-11-26 DIAGNOSIS — D509 Iron deficiency anemia, unspecified: Secondary | ICD-10-CM | POA: Diagnosis not present

## 2021-11-26 DIAGNOSIS — N183 Chronic kidney disease, stage 3 unspecified: Secondary | ICD-10-CM | POA: Diagnosis not present

## 2021-11-26 DIAGNOSIS — U071 COVID-19: Secondary | ICD-10-CM | POA: Diagnosis not present

## 2021-11-26 DIAGNOSIS — E785 Hyperlipidemia, unspecified: Secondary | ICD-10-CM | POA: Diagnosis not present

## 2021-11-26 DIAGNOSIS — D631 Anemia in chronic kidney disease: Secondary | ICD-10-CM | POA: Diagnosis not present

## 2021-11-26 DIAGNOSIS — E1122 Type 2 diabetes mellitus with diabetic chronic kidney disease: Secondary | ICD-10-CM | POA: Diagnosis not present

## 2021-11-26 DIAGNOSIS — I2510 Atherosclerotic heart disease of native coronary artery without angina pectoris: Secondary | ICD-10-CM | POA: Diagnosis not present

## 2021-11-26 DIAGNOSIS — J441 Chronic obstructive pulmonary disease with (acute) exacerbation: Secondary | ICD-10-CM | POA: Diagnosis not present

## 2021-11-29 DIAGNOSIS — I130 Hypertensive heart and chronic kidney disease with heart failure and stage 1 through stage 4 chronic kidney disease, or unspecified chronic kidney disease: Secondary | ICD-10-CM | POA: Diagnosis not present

## 2021-11-29 DIAGNOSIS — M19041 Primary osteoarthritis, right hand: Secondary | ICD-10-CM | POA: Diagnosis not present

## 2021-11-29 DIAGNOSIS — M19042 Primary osteoarthritis, left hand: Secondary | ICD-10-CM | POA: Diagnosis not present

## 2021-11-29 DIAGNOSIS — J441 Chronic obstructive pulmonary disease with (acute) exacerbation: Secondary | ICD-10-CM | POA: Diagnosis not present

## 2021-11-29 DIAGNOSIS — E1165 Type 2 diabetes mellitus with hyperglycemia: Secondary | ICD-10-CM | POA: Diagnosis not present

## 2021-11-29 DIAGNOSIS — H401130 Primary open-angle glaucoma, bilateral, stage unspecified: Secondary | ICD-10-CM | POA: Diagnosis not present

## 2021-11-29 DIAGNOSIS — E785 Hyperlipidemia, unspecified: Secondary | ICD-10-CM | POA: Diagnosis not present

## 2021-11-29 DIAGNOSIS — E11319 Type 2 diabetes mellitus with unspecified diabetic retinopathy without macular edema: Secondary | ICD-10-CM | POA: Diagnosis not present

## 2021-11-29 DIAGNOSIS — D509 Iron deficiency anemia, unspecified: Secondary | ICD-10-CM | POA: Diagnosis not present

## 2021-11-29 DIAGNOSIS — N183 Chronic kidney disease, stage 3 unspecified: Secondary | ICD-10-CM | POA: Diagnosis not present

## 2021-11-29 DIAGNOSIS — E871 Hypo-osmolality and hyponatremia: Secondary | ICD-10-CM | POA: Diagnosis not present

## 2021-11-29 DIAGNOSIS — E0590 Thyrotoxicosis, unspecified without thyrotoxic crisis or storm: Secondary | ICD-10-CM | POA: Diagnosis not present

## 2021-11-29 DIAGNOSIS — E1122 Type 2 diabetes mellitus with diabetic chronic kidney disease: Secondary | ICD-10-CM | POA: Diagnosis not present

## 2021-11-29 DIAGNOSIS — U071 COVID-19: Secondary | ICD-10-CM | POA: Diagnosis not present

## 2021-11-29 DIAGNOSIS — D631 Anemia in chronic kidney disease: Secondary | ICD-10-CM | POA: Diagnosis not present

## 2021-11-29 DIAGNOSIS — I447 Left bundle-branch block, unspecified: Secondary | ICD-10-CM | POA: Diagnosis not present

## 2021-11-29 DIAGNOSIS — F32A Depression, unspecified: Secondary | ICD-10-CM | POA: Diagnosis not present

## 2021-11-29 DIAGNOSIS — I509 Heart failure, unspecified: Secondary | ICD-10-CM | POA: Diagnosis not present

## 2021-11-29 DIAGNOSIS — J302 Other seasonal allergic rhinitis: Secondary | ICD-10-CM | POA: Diagnosis not present

## 2021-11-29 DIAGNOSIS — I2510 Atherosclerotic heart disease of native coronary artery without angina pectoris: Secondary | ICD-10-CM | POA: Diagnosis not present

## 2021-11-29 DIAGNOSIS — Z85118 Personal history of other malignant neoplasm of bronchus and lung: Secondary | ICD-10-CM | POA: Diagnosis not present

## 2021-11-29 DIAGNOSIS — F03A4 Unspecified dementia, mild, with anxiety: Secondary | ICD-10-CM | POA: Diagnosis not present

## 2021-11-29 DIAGNOSIS — F03A3 Unspecified dementia, mild, with mood disturbance: Secondary | ICD-10-CM | POA: Diagnosis not present

## 2021-11-29 DIAGNOSIS — K219 Gastro-esophageal reflux disease without esophagitis: Secondary | ICD-10-CM | POA: Diagnosis not present

## 2021-12-01 DIAGNOSIS — F03A3 Unspecified dementia, mild, with mood disturbance: Secondary | ICD-10-CM | POA: Diagnosis not present

## 2021-12-01 DIAGNOSIS — E1122 Type 2 diabetes mellitus with diabetic chronic kidney disease: Secondary | ICD-10-CM | POA: Diagnosis not present

## 2021-12-01 DIAGNOSIS — Z85118 Personal history of other malignant neoplasm of bronchus and lung: Secondary | ICD-10-CM | POA: Diagnosis not present

## 2021-12-01 DIAGNOSIS — E0590 Thyrotoxicosis, unspecified without thyrotoxic crisis or storm: Secondary | ICD-10-CM | POA: Diagnosis not present

## 2021-12-01 DIAGNOSIS — E871 Hypo-osmolality and hyponatremia: Secondary | ICD-10-CM | POA: Diagnosis not present

## 2021-12-01 DIAGNOSIS — D631 Anemia in chronic kidney disease: Secondary | ICD-10-CM | POA: Diagnosis not present

## 2021-12-01 DIAGNOSIS — E1165 Type 2 diabetes mellitus with hyperglycemia: Secondary | ICD-10-CM | POA: Diagnosis not present

## 2021-12-01 DIAGNOSIS — D509 Iron deficiency anemia, unspecified: Secondary | ICD-10-CM | POA: Diagnosis not present

## 2021-12-01 DIAGNOSIS — I447 Left bundle-branch block, unspecified: Secondary | ICD-10-CM | POA: Diagnosis not present

## 2021-12-01 DIAGNOSIS — N183 Chronic kidney disease, stage 3 unspecified: Secondary | ICD-10-CM | POA: Diagnosis not present

## 2021-12-01 DIAGNOSIS — E785 Hyperlipidemia, unspecified: Secondary | ICD-10-CM | POA: Diagnosis not present

## 2021-12-01 DIAGNOSIS — J441 Chronic obstructive pulmonary disease with (acute) exacerbation: Secondary | ICD-10-CM | POA: Diagnosis not present

## 2021-12-01 DIAGNOSIS — U071 COVID-19: Secondary | ICD-10-CM | POA: Diagnosis not present

## 2021-12-01 DIAGNOSIS — H401130 Primary open-angle glaucoma, bilateral, stage unspecified: Secondary | ICD-10-CM | POA: Diagnosis not present

## 2021-12-01 DIAGNOSIS — F03A4 Unspecified dementia, mild, with anxiety: Secondary | ICD-10-CM | POA: Diagnosis not present

## 2021-12-01 DIAGNOSIS — J302 Other seasonal allergic rhinitis: Secondary | ICD-10-CM | POA: Diagnosis not present

## 2021-12-01 DIAGNOSIS — E11319 Type 2 diabetes mellitus with unspecified diabetic retinopathy without macular edema: Secondary | ICD-10-CM | POA: Diagnosis not present

## 2021-12-01 DIAGNOSIS — M19042 Primary osteoarthritis, left hand: Secondary | ICD-10-CM | POA: Diagnosis not present

## 2021-12-01 DIAGNOSIS — M19041 Primary osteoarthritis, right hand: Secondary | ICD-10-CM | POA: Diagnosis not present

## 2021-12-01 DIAGNOSIS — I130 Hypertensive heart and chronic kidney disease with heart failure and stage 1 through stage 4 chronic kidney disease, or unspecified chronic kidney disease: Secondary | ICD-10-CM | POA: Diagnosis not present

## 2021-12-01 DIAGNOSIS — I509 Heart failure, unspecified: Secondary | ICD-10-CM | POA: Diagnosis not present

## 2021-12-01 DIAGNOSIS — F32A Depression, unspecified: Secondary | ICD-10-CM | POA: Diagnosis not present

## 2021-12-01 DIAGNOSIS — I2510 Atherosclerotic heart disease of native coronary artery without angina pectoris: Secondary | ICD-10-CM | POA: Diagnosis not present

## 2021-12-01 DIAGNOSIS — K219 Gastro-esophageal reflux disease without esophagitis: Secondary | ICD-10-CM | POA: Diagnosis not present

## 2021-12-02 ENCOUNTER — Other Ambulatory Visit: Payer: Self-pay | Admitting: *Deleted

## 2021-12-02 NOTE — Patient Outreach (Signed)
[facility]) Care Management Telephonic RN Care Manager Note   12/02/2021 Name:  [name] MRN:  [id number] DOB:  Aug 25, 1946  Summary: Incoming call received back from daughter after missed call.  Denies any urgent concerns, encouraged to contact this care manager with questions.    Recommendations/Changes made from today's visit: Continue therapy sessions with PT.  Continue to monitor blood sugar and blood pressure daily.  Subjective: [name] is an 76 y.o. year old female who is a primary patient of McGowen, [name], MD. The care management team was consulted for assistance with care management and/or care coordination needs.    Telephonic RN Care Manager completed Telephone Visit today.  Objective:   Medications Reviewed Today     Reviewed by [name], MD (Physician) on 10/29/21 at [place] List Status: <None>   Medication Order Taking? Sig Documenting Provider Last Dose Status Informant  albuterol (PROVENTIL) (2.5 MG/3ML) 0.083% nebulizer solution [id number]  USE 1 VIAL IN NEBULIZER EVERY 4 HOURS AS NEEDED FOR WHEEZING AND FOR SHORTNESS OF BREATH  Patient taking differently: Take 2.5 mg by nebulization every 4 (four) hours as needed for wheezing or shortness of breath.   McGowen, [name], MD  Active Family Member  albuterol (VENTOLIN HFA) 108 (90 Base) MCG/ACT inhaler [id number]  INHALE 2 PUFFS BY MOUTH EVERY 6 HOURS AS NEEDED FOR WHEEZING OR SHORTNESS OF BREATH  Patient taking differently: 2 puffs every 6 (six) hours as needed for wheezing.   [name], MD  Active Family Member  aspirin 81 MG EC tablet [id number]  Take 81 mg by mouth daily.  [provider]  Active Family Member  atorvastatin (LIPITOR) 40 MG tablet [id number]  Take 1 tablet by mouth once daily  Patient taking differently: Take 40 mg by mouth daily.   [name], MD  Active Family Member  buPROPion (WELLBUTRIN XL) 150 MG 24 hr tablet [id number]   Take 1 tablet by mouth once daily  Patient taking differently: 150 mg daily.   [name], MD  Active Family Member  Calcium Carbonate-Vit D-Min (CALTRATE 600+D PLUS MINERALS) 600-800 MG-UNIT TABS [id number]  Take 1-2 tablets by mouth See admin instructions. Takes 1 tablet in the morning and 2 tablets at night [provider]  Active Family Member  cetirizine (ZYRTEC) 10 MG tablet [id number]  Take 10 mg by mouth daily. [provider]  Active Family Member  escitalopram (LEXAPRO) 20 MG tablet [id number]  Take 1 tablet by mouth once daily  Patient taking differently: Take 20 mg by mouth daily.   [name], MD  Active Family Member  fluticasone [facility] HFA) 220 MCG/ACT inhaler [id number]  Inhale 2 puffs into the lungs 2 (two) times daily. [name], MD  Active Family Member  ibandronate (BONIVA) 150 MG tablet [id number]  Take 1 tablet (150 mg total) by mouth every 30 (thirty) days. Take in the morning with a full glass of water, on an empty stomach, and do not take anything else by mouth or lie down for the next 30 min. McGowen, [name], MD  Active Family Member  insulin isophane & regular human (HUMULIN 70/30 KWIKPEN) (70-30) 100 UNIT/ML KwikPen [id number]  INJECT 4 UNITS SUBCUTANEOUSLY WITH BREAKFAST AND 2 UNITS WITH SUPPER  Patient taking differently: 4 Units 2 (two) times daily.   McGowen, [name], MD  Active Family Member  Lancets (ONETOUCH DELICA PLUS [id number]) [place] [id number]  USE 1  TO  CHECK GLUCOSE THREE TIMES DAILY McGowen, [name], MD  Active Family Member  metoprolol tartrate (LOPRESSOR) 25 MG tablet [id number]  Take 1/2 (one-half) tablet by mouth twice daily  Patient taking differently: Take 12.5 mg by mouth 2 (two) times daily.   [name], MD  Active Family Member  Multiple Vitamins-Minerals (MULTIVITAMIN WOMEN PO) [id number]  Take 1 tablet by mouth daily. [provider]  Active Family Member  [facility] ULTRA test strip [id number]   USE 1 STRIP TO CHECK GLUCOSE THREE TIMES DAILY McGowen, [name], MD  Active Family Member  pantoprazole (PROTONIX) 40 MG tablet [id number]  Take 1 tablet by mouth once daily  Patient taking differently: 40 mg daily.   McGowen, [name], MD  Active Family Member             SDOH:  (Social Determinants of Health) assessments and interventions performed:     Care Plan  Review of patient past medical history, allergies, medications, health status, including review of consultants reports, laboratory and other test data, was performed as part of comprehensive evaluation for care management services.   Care Plan : RNCM Plan of Care  Updates made by [name], RN since 12/02/2021 12:00 AM     Problem: Chronic care management of multiple chronic conditions (CHF, DM, COPD)   Priority: High     Long-Range Goal: Ability for member and family to adequately manage chronic conditions (CHF, DM, COPD)   Start Date: 08/16/2021  Expected End Date: 02/12/2022  This Visit's Progress: On track  Recent Progress: On track  Priority: High  Note:   Current Barriers:  Knowledge Deficits related to plan of care for management of CHF, COPD, and DMII Chronic Disease Management support and education needs related to CHF, COPD, and DMII  RNCM Clinical Goal(s):  Patient will verbalize understanding of plan for management of CHF, COPD, and DMII take all medications exactly as prescribed and will call provider for medication related questions attend all scheduled medical appointments: PCP continue to work with RN Care Manager to address care management and care coordination needs related to  CHF, COPD, and DMII work with [facility]  to increase strength and mobility through PT.  Noted per chart, HH sessions have been approved for extension demonstrate a decrease in CHF, COPD, and DMII exacerbations    through collaboration with RN Care manager, provider, and care team.   Interventions: Inter-disciplinary care  team collaboration (see longitudinal plan of care) Evaluation of current treatment plan related to  self management and patient's adherence to plan as established by provider   Diabetes Interventions: Assessed patient's understanding of A1c goal: <6.5% Provided education to patient about basic DM disease process; Discussed plans with patient for ongoing care management follow up and provided patient with direct contact information for care management team; Advised patient, providing education and rationale, to check cbg daily and record, calling PCP for findings outside established parameters; Screening for signs and symptoms of depression related to chronic disease state;  Lab Results  Component Value Date   HGBA1C 6.5 (H) 10/29/2021   COPD Interventions:   Provided patient with basic written and verbal COPD education on self care/management/and exacerbation prevention; Advised patient to track and manage COPD triggers;  Advised patient to self assesses COPD action plan zone and make appointment with provider if in the yellow zone for 48 hours without improvement; Discussed the importance of adequate rest and management of fatigue with COPD; Screening for signs and symptoms of depression  related to chronic disease state;  Advised daughter to call PCP office as long nodule was noted on most recent CXR, recommended to have CT scan, not scheduled yet.  She was provided antibiotics and steroids recently for pneumonia, per daughter, this has improved.  Heart Failure Interventions: Basic overview and discussion of pathophysiology of Heart Failure reviewed; Provided education on low sodium diet; Provided education about placing scale on hard, flat surface; Advised patient to weigh each morning after emptying bladder; Discussed the importance of keeping all appointments with provider;  Patient Goals/Self-Care Activities: Patient will self administer medications as prescribed Patient will  attend all scheduled provider appointments Patient will continue to perform ADL's independently  Follow Up Plan:  Telephone follow up appointment with care management team member scheduled for:  12/27/2021   Update 11/21 - Per daughter, member is "ok."  State she has decreased strength in the last couple weeks as home health PT has completed.  She was told that they would request an extension but has not heard anything back from office.  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  She had chest CT done last week, waiting call from PCP office for follow up and test results.  Call placed to [facility] to follow up on renewal of PT order, state they do not have an open case for member currently and would need new orders.  Call placed to PCP office, message left requesting call back and/or new orders for home health PT.     Update 12/9 - Daughter report member is about the same.  She has not been able to get her to consistently do strengthening exercises independently at home.  Advised that this care manager was able to speak with Vevelyn Royals at PCP office.  They attempted to provide referral to [facility] however they were told that insurance would not approve it as member has not had a change in status, such as repeat hospital admission.  Daughter verbalizes understanding, state she is off the next 2 weeks and will work with her on exercises provided by PT.  Hoping to help member improve strength, if member declines will speak with PCP and have reassessment done.    Update 1/11 - Spoke with daughter, state member was admitted to hospital for 2 days since last outreach, diagnosed with Covid and UTI.  Report member became weak and unable to stand/walk, increased confusion.  Was treated with antibiotics, steroids, and antiviral, state member is much better now.  She has increased in strength and back to her mental baseline.  She now  has home health for PT and OT, will have next visit today.  Was also seen by PCP last week for follow up, no changes in plan of care, will revisit in 3 months.   Update 2/9 - Per daughter, member has been doing well since recent discharge.  She is no longer having symptoms of her Covid or UTI infections, antibiotic courses complete.  Continues to have home health PT, daughter feels this has helped tremendously.  Blood sugars and blood pressure remain stable, A1C now at goal of 6.5.        Plan:  Telephone follow up appointment with care management team member scheduled for:  1 month. The patient has been provided with contact information for the care management team and has been advised to call with any health related questions or concerns.  [name], RN, MSN, [place] Manager [phone]

## 2021-12-02 NOTE — Patient Outreach (Signed)
[facility]) Care Management  12/02/2021  Mittie Knittel 17-Oct-1946 [id number]   Outgoing call placed to member's daughter, no answer, unable to leave voice message.  Will follow up within then next 3-4 business days.  [name], RN, MSN, [place] Level Manager [phone]

## 2021-12-07 DIAGNOSIS — D509 Iron deficiency anemia, unspecified: Secondary | ICD-10-CM | POA: Diagnosis not present

## 2021-12-07 DIAGNOSIS — N183 Chronic kidney disease, stage 3 unspecified: Secondary | ICD-10-CM | POA: Diagnosis not present

## 2021-12-07 DIAGNOSIS — I509 Heart failure, unspecified: Secondary | ICD-10-CM | POA: Diagnosis not present

## 2021-12-07 DIAGNOSIS — J302 Other seasonal allergic rhinitis: Secondary | ICD-10-CM | POA: Diagnosis not present

## 2021-12-07 DIAGNOSIS — E1165 Type 2 diabetes mellitus with hyperglycemia: Secondary | ICD-10-CM | POA: Diagnosis not present

## 2021-12-07 DIAGNOSIS — H401130 Primary open-angle glaucoma, bilateral, stage unspecified: Secondary | ICD-10-CM | POA: Diagnosis not present

## 2021-12-07 DIAGNOSIS — E871 Hypo-osmolality and hyponatremia: Secondary | ICD-10-CM | POA: Diagnosis not present

## 2021-12-07 DIAGNOSIS — I447 Left bundle-branch block, unspecified: Secondary | ICD-10-CM | POA: Diagnosis not present

## 2021-12-07 DIAGNOSIS — E785 Hyperlipidemia, unspecified: Secondary | ICD-10-CM | POA: Diagnosis not present

## 2021-12-07 DIAGNOSIS — M19041 Primary osteoarthritis, right hand: Secondary | ICD-10-CM | POA: Diagnosis not present

## 2021-12-07 DIAGNOSIS — E0590 Thyrotoxicosis, unspecified without thyrotoxic crisis or storm: Secondary | ICD-10-CM | POA: Diagnosis not present

## 2021-12-07 DIAGNOSIS — F32A Depression, unspecified: Secondary | ICD-10-CM | POA: Diagnosis not present

## 2021-12-07 DIAGNOSIS — F03A3 Unspecified dementia, mild, with mood disturbance: Secondary | ICD-10-CM | POA: Diagnosis not present

## 2021-12-07 DIAGNOSIS — D631 Anemia in chronic kidney disease: Secondary | ICD-10-CM | POA: Diagnosis not present

## 2021-12-07 DIAGNOSIS — I2510 Atherosclerotic heart disease of native coronary artery without angina pectoris: Secondary | ICD-10-CM | POA: Diagnosis not present

## 2021-12-07 DIAGNOSIS — F03A4 Unspecified dementia, mild, with anxiety: Secondary | ICD-10-CM | POA: Diagnosis not present

## 2021-12-07 DIAGNOSIS — U071 COVID-19: Secondary | ICD-10-CM | POA: Diagnosis not present

## 2021-12-07 DIAGNOSIS — Z85118 Personal history of other malignant neoplasm of bronchus and lung: Secondary | ICD-10-CM | POA: Diagnosis not present

## 2021-12-07 DIAGNOSIS — I130 Hypertensive heart and chronic kidney disease with heart failure and stage 1 through stage 4 chronic kidney disease, or unspecified chronic kidney disease: Secondary | ICD-10-CM | POA: Diagnosis not present

## 2021-12-07 DIAGNOSIS — K219 Gastro-esophageal reflux disease without esophagitis: Secondary | ICD-10-CM | POA: Diagnosis not present

## 2021-12-07 DIAGNOSIS — E11319 Type 2 diabetes mellitus with unspecified diabetic retinopathy without macular edema: Secondary | ICD-10-CM | POA: Diagnosis not present

## 2021-12-07 DIAGNOSIS — E1122 Type 2 diabetes mellitus with diabetic chronic kidney disease: Secondary | ICD-10-CM | POA: Diagnosis not present

## 2021-12-07 DIAGNOSIS — J441 Chronic obstructive pulmonary disease with (acute) exacerbation: Secondary | ICD-10-CM | POA: Diagnosis not present

## 2021-12-07 DIAGNOSIS — M19042 Primary osteoarthritis, left hand: Secondary | ICD-10-CM | POA: Diagnosis not present

## 2021-12-09 DIAGNOSIS — J302 Other seasonal allergic rhinitis: Secondary | ICD-10-CM | POA: Diagnosis not present

## 2021-12-09 DIAGNOSIS — J441 Chronic obstructive pulmonary disease with (acute) exacerbation: Secondary | ICD-10-CM | POA: Diagnosis not present

## 2021-12-09 DIAGNOSIS — M19042 Primary osteoarthritis, left hand: Secondary | ICD-10-CM | POA: Diagnosis not present

## 2021-12-09 DIAGNOSIS — U071 COVID-19: Secondary | ICD-10-CM | POA: Diagnosis not present

## 2021-12-09 DIAGNOSIS — M19041 Primary osteoarthritis, right hand: Secondary | ICD-10-CM | POA: Diagnosis not present

## 2021-12-09 DIAGNOSIS — I509 Heart failure, unspecified: Secondary | ICD-10-CM | POA: Diagnosis not present

## 2021-12-09 DIAGNOSIS — I130 Hypertensive heart and chronic kidney disease with heart failure and stage 1 through stage 4 chronic kidney disease, or unspecified chronic kidney disease: Secondary | ICD-10-CM | POA: Diagnosis not present

## 2021-12-09 DIAGNOSIS — K219 Gastro-esophageal reflux disease without esophagitis: Secondary | ICD-10-CM | POA: Diagnosis not present

## 2021-12-09 DIAGNOSIS — F32A Depression, unspecified: Secondary | ICD-10-CM | POA: Diagnosis not present

## 2021-12-09 DIAGNOSIS — D509 Iron deficiency anemia, unspecified: Secondary | ICD-10-CM | POA: Diagnosis not present

## 2021-12-09 DIAGNOSIS — F03A4 Unspecified dementia, mild, with anxiety: Secondary | ICD-10-CM | POA: Diagnosis not present

## 2021-12-09 DIAGNOSIS — E1122 Type 2 diabetes mellitus with diabetic chronic kidney disease: Secondary | ICD-10-CM | POA: Diagnosis not present

## 2021-12-09 DIAGNOSIS — E11319 Type 2 diabetes mellitus with unspecified diabetic retinopathy without macular edema: Secondary | ICD-10-CM | POA: Diagnosis not present

## 2021-12-09 DIAGNOSIS — N183 Chronic kidney disease, stage 3 unspecified: Secondary | ICD-10-CM | POA: Diagnosis not present

## 2021-12-09 DIAGNOSIS — E0590 Thyrotoxicosis, unspecified without thyrotoxic crisis or storm: Secondary | ICD-10-CM | POA: Diagnosis not present

## 2021-12-09 DIAGNOSIS — E785 Hyperlipidemia, unspecified: Secondary | ICD-10-CM | POA: Diagnosis not present

## 2021-12-09 DIAGNOSIS — D631 Anemia in chronic kidney disease: Secondary | ICD-10-CM | POA: Diagnosis not present

## 2021-12-09 DIAGNOSIS — F03A3 Unspecified dementia, mild, with mood disturbance: Secondary | ICD-10-CM | POA: Diagnosis not present

## 2021-12-09 DIAGNOSIS — E871 Hypo-osmolality and hyponatremia: Secondary | ICD-10-CM | POA: Diagnosis not present

## 2021-12-09 DIAGNOSIS — H401130 Primary open-angle glaucoma, bilateral, stage unspecified: Secondary | ICD-10-CM | POA: Diagnosis not present

## 2021-12-09 DIAGNOSIS — I2510 Atherosclerotic heart disease of native coronary artery without angina pectoris: Secondary | ICD-10-CM | POA: Diagnosis not present

## 2021-12-09 DIAGNOSIS — I447 Left bundle-branch block, unspecified: Secondary | ICD-10-CM | POA: Diagnosis not present

## 2021-12-09 DIAGNOSIS — E1165 Type 2 diabetes mellitus with hyperglycemia: Secondary | ICD-10-CM | POA: Diagnosis not present

## 2021-12-09 DIAGNOSIS — Z85118 Personal history of other malignant neoplasm of bronchus and lung: Secondary | ICD-10-CM | POA: Diagnosis not present

## 2021-12-12 ENCOUNTER — Other Ambulatory Visit: Payer: Self-pay | Admitting: Family Medicine

## 2021-12-13 DIAGNOSIS — F03A4 Unspecified dementia, mild, with anxiety: Secondary | ICD-10-CM | POA: Diagnosis not present

## 2021-12-13 DIAGNOSIS — D509 Iron deficiency anemia, unspecified: Secondary | ICD-10-CM | POA: Diagnosis not present

## 2021-12-13 DIAGNOSIS — I447 Left bundle-branch block, unspecified: Secondary | ICD-10-CM | POA: Diagnosis not present

## 2021-12-13 DIAGNOSIS — I130 Hypertensive heart and chronic kidney disease with heart failure and stage 1 through stage 4 chronic kidney disease, or unspecified chronic kidney disease: Secondary | ICD-10-CM | POA: Diagnosis not present

## 2021-12-13 DIAGNOSIS — E0590 Thyrotoxicosis, unspecified without thyrotoxic crisis or storm: Secondary | ICD-10-CM | POA: Diagnosis not present

## 2021-12-13 DIAGNOSIS — E1165 Type 2 diabetes mellitus with hyperglycemia: Secondary | ICD-10-CM | POA: Diagnosis not present

## 2021-12-13 DIAGNOSIS — M19041 Primary osteoarthritis, right hand: Secondary | ICD-10-CM | POA: Diagnosis not present

## 2021-12-13 DIAGNOSIS — E871 Hypo-osmolality and hyponatremia: Secondary | ICD-10-CM | POA: Diagnosis not present

## 2021-12-13 DIAGNOSIS — Z85118 Personal history of other malignant neoplasm of bronchus and lung: Secondary | ICD-10-CM | POA: Diagnosis not present

## 2021-12-13 DIAGNOSIS — I2510 Atherosclerotic heart disease of native coronary artery without angina pectoris: Secondary | ICD-10-CM | POA: Diagnosis not present

## 2021-12-13 DIAGNOSIS — E1122 Type 2 diabetes mellitus with diabetic chronic kidney disease: Secondary | ICD-10-CM | POA: Diagnosis not present

## 2021-12-13 DIAGNOSIS — D631 Anemia in chronic kidney disease: Secondary | ICD-10-CM | POA: Diagnosis not present

## 2021-12-13 DIAGNOSIS — I509 Heart failure, unspecified: Secondary | ICD-10-CM | POA: Diagnosis not present

## 2021-12-13 DIAGNOSIS — E785 Hyperlipidemia, unspecified: Secondary | ICD-10-CM | POA: Diagnosis not present

## 2021-12-13 DIAGNOSIS — U071 COVID-19: Secondary | ICD-10-CM | POA: Diagnosis not present

## 2021-12-13 DIAGNOSIS — F32A Depression, unspecified: Secondary | ICD-10-CM | POA: Diagnosis not present

## 2021-12-13 DIAGNOSIS — N183 Chronic kidney disease, stage 3 unspecified: Secondary | ICD-10-CM | POA: Diagnosis not present

## 2021-12-13 DIAGNOSIS — J302 Other seasonal allergic rhinitis: Secondary | ICD-10-CM | POA: Diagnosis not present

## 2021-12-13 DIAGNOSIS — M19042 Primary osteoarthritis, left hand: Secondary | ICD-10-CM | POA: Diagnosis not present

## 2021-12-13 DIAGNOSIS — K219 Gastro-esophageal reflux disease without esophagitis: Secondary | ICD-10-CM | POA: Diagnosis not present

## 2021-12-13 DIAGNOSIS — F03A3 Unspecified dementia, mild, with mood disturbance: Secondary | ICD-10-CM | POA: Diagnosis not present

## 2021-12-13 DIAGNOSIS — H401130 Primary open-angle glaucoma, bilateral, stage unspecified: Secondary | ICD-10-CM | POA: Diagnosis not present

## 2021-12-13 DIAGNOSIS — J441 Chronic obstructive pulmonary disease with (acute) exacerbation: Secondary | ICD-10-CM | POA: Diagnosis not present

## 2021-12-13 DIAGNOSIS — E11319 Type 2 diabetes mellitus with unspecified diabetic retinopathy without macular edema: Secondary | ICD-10-CM | POA: Diagnosis not present

## 2021-12-15 DIAGNOSIS — Z85118 Personal history of other malignant neoplasm of bronchus and lung: Secondary | ICD-10-CM | POA: Diagnosis not present

## 2021-12-15 DIAGNOSIS — F03A3 Unspecified dementia, mild, with mood disturbance: Secondary | ICD-10-CM | POA: Diagnosis not present

## 2021-12-15 DIAGNOSIS — I509 Heart failure, unspecified: Secondary | ICD-10-CM | POA: Diagnosis not present

## 2021-12-15 DIAGNOSIS — F32A Depression, unspecified: Secondary | ICD-10-CM | POA: Diagnosis not present

## 2021-12-15 DIAGNOSIS — E785 Hyperlipidemia, unspecified: Secondary | ICD-10-CM | POA: Diagnosis not present

## 2021-12-15 DIAGNOSIS — I447 Left bundle-branch block, unspecified: Secondary | ICD-10-CM | POA: Diagnosis not present

## 2021-12-15 DIAGNOSIS — U071 COVID-19: Secondary | ICD-10-CM | POA: Diagnosis not present

## 2021-12-15 DIAGNOSIS — J441 Chronic obstructive pulmonary disease with (acute) exacerbation: Secondary | ICD-10-CM | POA: Diagnosis not present

## 2021-12-15 DIAGNOSIS — D509 Iron deficiency anemia, unspecified: Secondary | ICD-10-CM | POA: Diagnosis not present

## 2021-12-15 DIAGNOSIS — H401130 Primary open-angle glaucoma, bilateral, stage unspecified: Secondary | ICD-10-CM | POA: Diagnosis not present

## 2021-12-15 DIAGNOSIS — M19042 Primary osteoarthritis, left hand: Secondary | ICD-10-CM | POA: Diagnosis not present

## 2021-12-15 DIAGNOSIS — E1165 Type 2 diabetes mellitus with hyperglycemia: Secondary | ICD-10-CM | POA: Diagnosis not present

## 2021-12-15 DIAGNOSIS — E11319 Type 2 diabetes mellitus with unspecified diabetic retinopathy without macular edema: Secondary | ICD-10-CM | POA: Diagnosis not present

## 2021-12-15 DIAGNOSIS — D631 Anemia in chronic kidney disease: Secondary | ICD-10-CM | POA: Diagnosis not present

## 2021-12-15 DIAGNOSIS — M19041 Primary osteoarthritis, right hand: Secondary | ICD-10-CM | POA: Diagnosis not present

## 2021-12-15 DIAGNOSIS — F03A4 Unspecified dementia, mild, with anxiety: Secondary | ICD-10-CM | POA: Diagnosis not present

## 2021-12-15 DIAGNOSIS — I2510 Atherosclerotic heart disease of native coronary artery without angina pectoris: Secondary | ICD-10-CM | POA: Diagnosis not present

## 2021-12-15 DIAGNOSIS — E871 Hypo-osmolality and hyponatremia: Secondary | ICD-10-CM | POA: Diagnosis not present

## 2021-12-15 DIAGNOSIS — K219 Gastro-esophageal reflux disease without esophagitis: Secondary | ICD-10-CM | POA: Diagnosis not present

## 2021-12-15 DIAGNOSIS — E1122 Type 2 diabetes mellitus with diabetic chronic kidney disease: Secondary | ICD-10-CM | POA: Diagnosis not present

## 2021-12-15 DIAGNOSIS — N183 Chronic kidney disease, stage 3 unspecified: Secondary | ICD-10-CM | POA: Diagnosis not present

## 2021-12-15 DIAGNOSIS — I130 Hypertensive heart and chronic kidney disease with heart failure and stage 1 through stage 4 chronic kidney disease, or unspecified chronic kidney disease: Secondary | ICD-10-CM | POA: Diagnosis not present

## 2021-12-15 DIAGNOSIS — E0590 Thyrotoxicosis, unspecified without thyrotoxic crisis or storm: Secondary | ICD-10-CM | POA: Diagnosis not present

## 2021-12-15 DIAGNOSIS — J302 Other seasonal allergic rhinitis: Secondary | ICD-10-CM | POA: Diagnosis not present

## 2021-12-20 DIAGNOSIS — J302 Other seasonal allergic rhinitis: Secondary | ICD-10-CM | POA: Diagnosis not present

## 2021-12-20 DIAGNOSIS — D509 Iron deficiency anemia, unspecified: Secondary | ICD-10-CM | POA: Diagnosis not present

## 2021-12-20 DIAGNOSIS — I130 Hypertensive heart and chronic kidney disease with heart failure and stage 1 through stage 4 chronic kidney disease, or unspecified chronic kidney disease: Secondary | ICD-10-CM | POA: Diagnosis not present

## 2021-12-20 DIAGNOSIS — D631 Anemia in chronic kidney disease: Secondary | ICD-10-CM | POA: Diagnosis not present

## 2021-12-20 DIAGNOSIS — N183 Chronic kidney disease, stage 3 unspecified: Secondary | ICD-10-CM | POA: Diagnosis not present

## 2021-12-20 DIAGNOSIS — J441 Chronic obstructive pulmonary disease with (acute) exacerbation: Secondary | ICD-10-CM | POA: Diagnosis not present

## 2021-12-20 DIAGNOSIS — F03A4 Unspecified dementia, mild, with anxiety: Secondary | ICD-10-CM | POA: Diagnosis not present

## 2021-12-20 DIAGNOSIS — F03A3 Unspecified dementia, mild, with mood disturbance: Secondary | ICD-10-CM | POA: Diagnosis not present

## 2021-12-20 DIAGNOSIS — K219 Gastro-esophageal reflux disease without esophagitis: Secondary | ICD-10-CM | POA: Diagnosis not present

## 2021-12-20 DIAGNOSIS — I447 Left bundle-branch block, unspecified: Secondary | ICD-10-CM | POA: Diagnosis not present

## 2021-12-20 DIAGNOSIS — F32A Depression, unspecified: Secondary | ICD-10-CM | POA: Diagnosis not present

## 2021-12-20 DIAGNOSIS — M19041 Primary osteoarthritis, right hand: Secondary | ICD-10-CM | POA: Diagnosis not present

## 2021-12-20 DIAGNOSIS — E1165 Type 2 diabetes mellitus with hyperglycemia: Secondary | ICD-10-CM | POA: Diagnosis not present

## 2021-12-20 DIAGNOSIS — Z85118 Personal history of other malignant neoplasm of bronchus and lung: Secondary | ICD-10-CM | POA: Diagnosis not present

## 2021-12-20 DIAGNOSIS — E871 Hypo-osmolality and hyponatremia: Secondary | ICD-10-CM | POA: Diagnosis not present

## 2021-12-20 DIAGNOSIS — M19042 Primary osteoarthritis, left hand: Secondary | ICD-10-CM | POA: Diagnosis not present

## 2021-12-20 DIAGNOSIS — U071 COVID-19: Secondary | ICD-10-CM | POA: Diagnosis not present

## 2021-12-20 DIAGNOSIS — H401130 Primary open-angle glaucoma, bilateral, stage unspecified: Secondary | ICD-10-CM | POA: Diagnosis not present

## 2021-12-20 DIAGNOSIS — E1122 Type 2 diabetes mellitus with diabetic chronic kidney disease: Secondary | ICD-10-CM | POA: Diagnosis not present

## 2021-12-20 DIAGNOSIS — I2510 Atherosclerotic heart disease of native coronary artery without angina pectoris: Secondary | ICD-10-CM | POA: Diagnosis not present

## 2021-12-20 DIAGNOSIS — E0590 Thyrotoxicosis, unspecified without thyrotoxic crisis or storm: Secondary | ICD-10-CM | POA: Diagnosis not present

## 2021-12-20 DIAGNOSIS — E11319 Type 2 diabetes mellitus with unspecified diabetic retinopathy without macular edema: Secondary | ICD-10-CM | POA: Diagnosis not present

## 2021-12-20 DIAGNOSIS — I509 Heart failure, unspecified: Secondary | ICD-10-CM | POA: Diagnosis not present

## 2021-12-20 DIAGNOSIS — E785 Hyperlipidemia, unspecified: Secondary | ICD-10-CM | POA: Diagnosis not present

## 2021-12-22 ENCOUNTER — Ambulatory Visit (INDEPENDENT_AMBULATORY_CARE_PROVIDER_SITE_OTHER): Payer: Medicare Other

## 2021-12-22 ENCOUNTER — Ambulatory Visit: Payer: Medicare Other

## 2021-12-22 ENCOUNTER — Other Ambulatory Visit: Payer: Self-pay

## 2021-12-22 VITALS — BP 104/62 | HR 92 | Temp 98.0°F | Wt 144.1 lb

## 2021-12-22 DIAGNOSIS — Z0000 Encounter for general adult medical examination without abnormal findings: Secondary | ICD-10-CM | POA: Diagnosis not present

## 2021-12-22 NOTE — Progress Notes (Signed)
Subjective:   Suheyla Mortellaro is a 76 y.o. female who presents for Medicare Annual (Subsequent) preventive examination.  Review of Systems     Cardiac Risk Factors include: advanced age (>31mn, >>59women);hypertension;diabetes mellitus;dyslipidemia     Objective:    Today's Vitals   12/22/21 1456  BP: 104/62  Pulse: 92  Temp: 98 F (36.7 C)  SpO2: 96%  Weight: 144 lb 1.9 oz (65.4 kg)   Body mass index is 26.36 kg/m.  Advanced Directives 12/22/2021 10/20/2021 05/28/2021 03/23/2021 12/16/2020 10/09/2019 10/02/2019  Does Patient Have a Medical Advance Directive? Yes No No No Yes No No  Type of Advance Directive [place] will - -  Does patient want to make changes to medical advance directive? - - - - - - -  [place]? Yes - validated most recent copy scanned in chart (See row information) - - - Yes - validated most recent copy scanned in chart (See row information) - -  Would patient like information on creating a medical advance directive? - No - Patient declined No - Patient declined Yes (MAU/Ambulatory/Procedural Areas - Information given) - Yes (ED - Information included in AVS) Yes (ED - Information included in AVS)  Pre-existing out of facility DNR order (yellow form or pink MOST form) - - - - - - -    Current Medications (verified) Outpatient Encounter Medications as of 12/22/2021  Medication Sig   albuterol (PROVENTIL) (2.5 MG/3ML) 0.083% nebulizer solution USE 1 VIAL IN NEBULIZER EVERY 4 HOURS AS NEEDED FOR WHEEZING AND FOR SHORTNESS OF BREATH (Patient taking differently: Take 2.5 mg by nebulization every 4 (four) hours as needed for wheezing or shortness of breath.)   albuterol (VENTOLIN HFA) 108 (90 Base) MCG/ACT inhaler INHALE 2 PUFFS BY MOUTH EVERY 6 HOURS AS NEEDED FOR WHEEZING OR SHORTNESS OF BREATH (Patient taking differently: 2 puffs every 6 (six) hours as needed for  wheezing.)   aspirin 81 MG EC tablet Take 81 mg by mouth daily.    atorvastatin (LIPITOR) 40 MG tablet Take 1 tablet by mouth once daily (Patient taking differently: Take 40 mg by mouth daily.)   buPROPion (WELLBUTRIN XL) 150 MG 24 hr tablet Take 1 tablet by mouth once daily   Calcium Carbonate-Vit D-Min (CALTRATE 600+D PLUS MINERALS) 600-800 MG-UNIT TABS Take 1-2 tablets by mouth See admin instructions. Takes 1 tablet in the morning and 2 tablets at night   cetirizine (ZYRTEC) 10 MG tablet Take 10 mg by mouth daily.   escitalopram (LEXAPRO) 20 MG tablet Take 1 tablet by mouth once daily   fluticasone (FLOVENT HFA) 220 MCG/ACT inhaler Inhale 2 puffs into the lungs 2 (two) times daily.   ibandronate (BONIVA) 150 MG tablet Take 1 tablet (150 mg total) by mouth every 30 (thirty) days. Take in the morning with a full glass of water, on an empty stomach, and do not take anything else by mouth or lie down for the next 30 min.   insulin isophane & regular human (HUMULIN 70/30 KWIKPEN) (70-30) 100 UNIT/ML KwikPen INJECT 4 UNITS SUBCUTANEOUSLY WITH BREAKFAST AND 2 UNITS WITH SUPPER (Patient taking differently: 4 Units 2 (two) times daily. 4 units breakfast 4 units  for supper)   Lancets (ONETOUCH DELICA PLUS [id number] MISC USE 1  TO CHECK GLUCOSE THREE TIMES DAILY   metoprolol tartrate (LOPRESSOR) 25 MG tablet Take 1/2 (one-half) tablet by mouth twice daily (  Patient taking differently: Take 12.5 mg by mouth 2 (two) times daily.)   Multiple Vitamins-Minerals (MULTIVITAMIN WOMEN PO) Take 1 tablet by mouth daily.   ONETOUCH ULTRA test strip USE 1 STRIP TO CHECK GLUCOSE THREE TIMES DAILY   pantoprazole (PROTONIX) 40 MG tablet Take 1 tablet by mouth once daily (Patient taking differently: 40 mg daily.)   No facility-administered encounter medications on file as of 12/22/2021.    Allergies (verified) Lactose intolerance (gi) and Ibuprofen   History: Past Medical History:  Diagnosis Date   Acute renal  failure (HCC)    Age-related nuclear cataract of both eyes 2016   +cortical age related cataracts OU    Anxiety and depression    Bilateral diabetic retinopathy ([place]) 2015   Dr. 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   Colonic diverticular abscess    COPD (chronic obstructive pulmonary disease) (HCC)    CVA (cerebral vascular accident) ([place]) 09/2016   MRI did show a left-sided small ischemic stroke which was acute but likely incidental (MRI was done b/c pt had TIA sx's in hospital).  Neuro put her on plavix at that time.  Cardiology d/c'd her plavix 08/2017.   Diabetes mellitus with complication (HCC)    diab retinopathy OU (laser)   Diverticulitis of large intestine with perforation and abscess 09/11/2016   Finger fracture, right 04/2020   R 5th metacarpal fx at the base (Dr. Mardelle Matte)   GERD (gastroesophageal reflux disease)    protonix   History of concussion 08/25/2018   w/out loss of consciousness--mild increase in baseline memory impairment after.  CT head neg acute.   History of diverticulitis of colon    with abscess; required IR percutaneous drain placed 09/2016.   History of iron deficiency anemia    started iron 07/02/19.  EGD/colonoscopy 08/27/19 unrevealing. Capsule endoscopy considered but never done.   Hyperlipidemia    Hypertension    Left bundle branch block (LBBB) 12/16/2016   Lung cancer ([place])    non-small cell lung ca, stage III in 05/2011; systemic chemotherapy concurrent with radiation followed by prophylactic cranial irradiation and has been observation since July of 2010 with no evidence for disease recurrence-released from onc f/u 12/2014 (needs annual cxr by PCP).  CXR 08/2015 stable.  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 Earlie Server did bx and this was NEG for malignancy.   Mild  cognitive impairment with memory loss    Likely from brain radiation therapy   Non-obstructive CAD (coronary artery disease)    a. Cath 2006 preserved LV fxn, scattered irregularities without critical stenosis; b. 2008 stress echo negative for ischemia, but with hypertensive response; c. 08/2016 Cath: D1 25%, otw nl.   Normocytic anemia 2018   Iron, vit B12, folate all normal 12/2016.   Open-angle glaucoma 2016   Dr. Shirley Muscat, (bilateral)---responding to topical therapy   Osteoarthritis of both hands    Osteoporosis 03/2016; 03/2018   03/2016 DEXA T-score -2.2.  03/2018 T-score L femoral neck  -2.7--boniva started. DEXA 04/2021 T scor -2.6->boniva continued. Plan rpt DEXA 2 yrs.   Pneumonia    hospitalization 11/2016; hypoxemic resp failure--d/c'd home with home oxygen therapy   Pulmonary nodule, left 08/2021   LUL 11 mm ground glass, needs f/u CT 08/2022.   Seasonal allergic rhinitis    Subclinical  hyperthyroidism 2018   Takotsubo cardiomyopathy    a. 08/2016 Echo: EF 30-35%, gr1DD, PASP 3mHg;  b. 08/2016 Cath: nonobs Dzs;  c. 11/2016 Echo: EF 40-45%, no rwma, nl RV fxn, mild TR, PASP 375mg.  ECHO 02/2017 EF 50-55%, grd II DD---essentially resolved Takotsubo 02/2017.   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 08/2016 in setting of diverticulitis & pneumoperitoneum and Takotsubo CM -->prolonged QT, seen by EP-->avoid meds with potential for QT prolongation.   Past Surgical History:  Procedure Laterality Date   ABDOMINAL HYSTERECTOMY  1997   APPENDECTOMY     CARDIAC CATHETERIZATION N/A 09/14/2016   Procedure: Left Heart Cath and Coronary Angiography;  Surgeon: ThTroy SineMD;  [facility];  Service: Cardiovascular; Mild nonobstructive CAD with 85% smooth narrowing in the first diagonal branch of the LAD; 10% smooth narrowing of the ostial proximal left circumflex coronary artery; and a normal dominant RCA.   CARPAL TUNNEL RELEASE     CATARACT EXTRACTION Bilateral     CHOLECYSTECTOMY  2002   COLONOSCOPY  10/24/00; 08/27/19   2002 normal.  [facility] hemoccult testing via [facility] 06/18/15 was NEG.  2020->diverticulosis o/w nl.   dexa  03/2016; 03/2018   03/2018 T-score L femoral neck  -2.7 (worsened compared to osteopenic range 2017.) DEXA 05/03/21 stable T score -2.6 on boniva x 2 yrs->plan rpt DEXA 2 yrs.   ESOPHAGOGASTRODUODENOSCOPY  08/27/2019   Normal.   IR GENERIC HISTORICAL  09/19/2016   IR SINUS/FIST TUBE CHK-NON GI 09/19/2016 JaCorrie MckusickDO [facility]   IR GENERIC HISTORICAL  10/06/2016   IR RADIOLOGIST EVAL & MGMT 10/06/2016 [facility]   IR GENERIC HISTORICAL  10/26/2016   IR RADIOLOGIST EVAL & MGMT 10/26/2016 [facility]   IR GENERIC HISTORICAL  11/03/2016   IR RADIOLOGIST EVAL & MGMT 11/03/2016 WeArdis RowanPA-C [facility]   IR GENERIC HISTORICAL  11/24/2016   IR RADIOLOGIST EVAL & MGMT 11/24/2016 WeArdis RowanPA-C [facility]   IR GENERIC HISTORICAL  12/05/2016   Fistula smaller/improving.  IR SINUS/FIST TUBE CHK-NON GI 12/05/2016 JoSandi MariscalMD [facility]   IR GENERIC HISTORICAL  12/22/2016   IR RADIOLOGIST EVAL & MGMT 12/22/2016 MiGreggory KeenMD [facility]   IR RADIOLOGIST EVAL & MGMT  01/10/2017   IR RADIOLOGIST EVAL & MGMT  02/09/2017   IR SINUS/FIST TUBE CHK-NON GI  03/28/2017   LEFT HEART CATHETERIZATION WITH CORONARY ANGIOGRAM N/A 05/30/2012   Procedure: LEFT HEART CATHETERIZATION WITH CORONARY ANGIOGRAM;  Surgeon: ChBurnell BlanksMD;  [facility];  Service: Cardiovascular: Mild, non-obstructive CAD   TRANSTHORACIC ECHOCARDIOGRAM  09/14/2016; 11/2016   08/2016: EF 30-35 %, Akinesis of the mid-apicalanteroseptal myocardium.  Grade I DD.  Mild pulm HTN.  Repeat echo 11/2016, EF 40-45%, no rwma, nl RV fxn, mild TR, PASP 3736m.     TRANSTHORACIC ECHOCARDIOGRAM  03/07/2017   EF 50-55%. Hypokinesis of the distal septum with overall low normal LV,  systolic function; mild diastolic  dysfunction with elevated LV  filling pressure; mildly calcified aortic valve with mild AI; small pericardial effusion   Family History  Problem Relation Age of Onset   Heart failure Mother    Kidney disease Mother        renal failure   Diabetes Mother    Diabetes Father    Diabetes Brother    Heart disease Brother    Heart attack Brother    Heart  attack Brother    Heart attack Brother    Pancreatic cancer Brother    Colon cancer Neg Hx    Esophageal cancer Neg Hx    Stomach cancer Neg Hx    Rectal cancer Neg Hx    Social History   Socioeconomic History   Marital status: Widowed    Spouse name: Not on file   Number of children: Not on file   Years of education: Not on file   Highest education level: Not on file  Occupational History   Occupation: Retired Public affairs consultant: UNEMPLOYED  Tobacco Use   Smoking status: Former    Packs/day: 1.00    Years: 45.00    Pack years: 45.00    Types: Cigarettes    Quit date: 10/24/2008    Years since quitting: 13.1   Smokeless tobacco: Never  Vaping Use   Vaping Use: Never used  Substance and Sexual Activity   Alcohol use: No    Alcohol/week: 0.0 standard drinks   Drug use: No   Sexual activity: Not on file  Other Topics Concern   Not on file  Social History Narrative   Widow, lives in [place] with her daughter [name].   Husband died 2019/08/08 (esoph cancer, copd, chf).   No alcohol.   Social Determinants of Health   Financial Resource Strain: Low Risk    Difficulty of Paying Living Expenses: Not hard at all  Food Insecurity: No Food Insecurity   Worried About Charity fundraiser in the Last Year: Never true   [place] in the Last Year: Never true  Transportation Needs: No Transportation Needs   Lack of Transportation (Medical): No   Lack of Transportation (Non-Medical): No  Physical Activity: Unknown   Days of Exercise per Week: Not on file   Minutes of Exercise per Session: 10 min  Stress: No  Stress Concern Present   Feeling of Stress : Only a little  Social Connections: Socially Isolated   Frequency of Communication with Friends and Family: Twice a week   Frequency of Social Gatherings with Friends and Family: Never   Attends Religious Services: Never   Marine scientist or Organizations: No   Attends Archivist Meetings: Never   Marital Status: Widowed    Tobacco Counseling Counseling given: Not Answered   Clinical Intake:  Pre-visit preparation completed: Yes  Pain : No/denies pain     BMI - recorded: 26.36 Nutritional Status: BMI 25 -29 Overweight Nutritional Risks: None Diabetes: Yes CBG done?: No CBG resulted in Enter/ Edit results?: No Did pt. bring in CBG monitor from home?: No  How often do you need to have someone help you when you read instructions, pamphlets, or other written materials from your doctor or pharmacy?: 1 - Never  Diabetic?Nutrition Risk Assessment:  Has the patient had any N/V/D within the last 2 months?  No  Does the patient have any non-healing wounds?  No  Has the patient had any unintentional weight loss or weight gain?  No   Diabetes:  Is the patient diabetic?  Yes  If diabetic, was a CBG obtained today?  Yes  Did the patient bring in their glucometer from home?  No  How often do you monitor your CBG's? Daily.   Financial Strains and Diabetes Management:  Are you having any financial strains with the device, your supplies or your medication? No .  Does the patient want to be seen  by Chronic Care Management for management of their diabetes?  No  Would the patient like to be referred to a Nutritionist or for Diabetic Management?  No   Diabetic Exams:  Diabetic Eye Exam: Completed 03/31/21 Diabetic Foot Exam: Completed 07/20/21   Interpreter Needed?: No  Information entered by :: Charlott Rakes, LPN   Activities of Daily Living In your present state of health, do you have any difficulty performing the  following activities: 12/22/2021 10/20/2021  Hearing? N Y  Vision? N N  Difficulty concentrating or making decisions? N N  Walking or climbing stairs? Y Y  Comment SOB -  Dressing or bathing? N Y  Doing errands, shopping? N Y  Conservation officer, nature and eating ? Y -  Comment daughter assist -  Using the Toilet? N -  In the past six months, have you accidently leaked urine? Y -  Comment wears brief -  Do you have problems with loss of bowel control? Y -  Comment wears brief -  Managing your Medications? N -  Managing your Finances? N -  Housekeeping or managing your Housekeeping? N -  Some recent data might be hidden    Patient Care Team: [name], MD as PCP - General (Family Medicine) Leonie Man, MD as PCP - Cardiology (Cardiology) Rigoberto Noel, MD as Consulting Physician (Pulmonary Disease) Shirley Muscat Loreen Freud, MD as Consulting Physician (Optometry) Curt Bears, MD as Consulting Physician (Oncology) Hayden Pedro, MD as Consulting Physician (Ophthalmology) Almyra Deforest, [place] as Consulting Physician (Cardiology) Leonie Man, MD as Consulting Physician (Cardiology) Ralene Ok, MD as Consulting Physician (General Surgery) [name], RN as [place] any recent [place] you may have received from other than [facility] providers in the past year (date may be approximate).     Assessment:   This is a routine wellness examination for [place].  Hearing/Vision screen Hearing Screening - Comments:: Pt denies any hearing issues  Vision Screening - Comments:: Pt follows up with provider for annual eye exams   Dietary issues and exercise activities discussed: Current Exercise Habits: Home exercise routine, Type of exercise: stretching, Time (Minutes): 10, Frequency (Times/Week): 3, Weekly Exercise (Minutes/Week): 30   Goals Addressed             This Visit's Progress    Patient Stated       None at this times         Depression Screen PHQ 2/9 Scores 12/22/2021 08/02/2021 03/23/2021 12/16/2020 08/29/2019 08/09/2019 04/19/2019  PHQ - 2 Score 1 0 - 0 _0 PHQ- 9 Score - - - - _1 Exception Documentation - - Other- indicate reason in comment box - - - -    Fall Risk Fall Risk  12/22/2021 08/02/2021 04/19/2021 03/23/2021 12/16/2020  Falls in the past year? 1 0 1 0 1  Comment - - - - -  Number falls in past yr: 1 0 0 0 0  Comment - - - - -  Injury with Fall? 1 0 1 0 0  Risk Factor Category  - - - - -  Risk for fall due to : Impaired balance/gait;Impaired mobility;Impaired vision;History of fall(s) - History of fall(s);Impaired balance/gait Impaired balance/gait -  Risk for fall due to: Comment - - - - -  Follow up - Falls evaluation completed Falls evaluation completed - Falls prevention discussed  Comment - - - - -    FALL RISK  PREVENTION PERTAINING TO THE HOME:  Any stairs in or around the home? Yes  If so, are there any without handrails? No  Home free of loose throw rugs in walkways, pet beds, electrical cords, etc? Yes  Adequate lighting in your home to reduce risk of falls? Yes   ASSISTIVE DEVICES UTILIZED TO PREVENT FALLS:  Life alert? No  Use of a cane, walker or w/c? Yes  Grab bars in the bathroom? Yes  Shower chair or bench in shower? Yes  Elevated toilet seat or a handicapped toilet? No   TIMED UP AND GO:  Was the test performed? Yes .  Length of time to ambulate 10 feet: 20 sec.   Gait steady and fast with assistive device  Cognitive Function: MMSE - Mini Mental State Exam 11/12/2018 11/10/2017 07/05/2016  Orientation to time _0 Orientation to Place _1 Registration _2 Attention/ Calculation _3 Recall 0 2 3  Language- name 2 objects _4 Language- repeat _5 Language- follow 3 step command _6 Language- read & follow direction _7 Write a sentence _8 Copy design _9 Total score _10 6CIT Screen 12/22/2021 12/16/2020  What Year?  4 points 0 points  What month? 3 points 3 points  What time? 3 points 0 points  Count back from 20 0 points 0 points  Months in reverse 4 points 2 points  Repeat phrase 0 points 2 points  Total Score 14 7    Immunizations Immunization History  Administered Date(s) Administered   Fluad Quad(high Dose 65+) 08/29/2019, 10/13/2019, 10/28/2020, 07/20/2021   Influenza Split 07/06/2012   Influenza Whole 07/24/2006, 08/06/2007, 07/18/2008, 07/29/2009, 08/25/2010, 07/01/2011   Influenza, High Dose Seasonal PF 07/03/2015, 07/05/2016, 08/10/2017, 08/10/2018   Influenza,inj,Quad PF,6+ Mos 07/18/2013, 08/21/2014   Pneumococcal Conjugate-13 02/05/2011   Pneumococcal Polysaccharide-23 07/18/2008, 05/13/2014, 10/13/2019   Tdap 06/10/2017   Zoster, Live 04/07/2015      Flu Vaccine status: Up to date  Pneumococcal vaccine status: Up to date  Covid-19 vaccine status: Declined, Education has been provided regarding the importance of this vaccine but patient still declined. Advised may receive this vaccine at local pharmacy or Health Dept.or vaccine clinic. Aware to provide a copy of the vaccination record if obtained from local pharmacy or Health Dept. Verbalized acceptance and understanding.  Qualifies for Shingles Vaccine? Yes   Zostavax completed No   Shingrix Completed?: No.    Education has been provided regarding the importance of this vaccine. Patient has been advised to call insurance company to determine out of pocket expense if they have not yet received this vaccine. Advised may also receive vaccine at local pharmacy or Health Dept. Verbalized acceptance and understanding.  Screening Tests Health Maintenance  Topic Date Due   Zoster Vaccines- Shingrix (1 of 2) Never done   URINE MICROALBUMIN  08/11/2019   COVID-19 Vaccine (1) 01/07/2022 (Originally 12/21/1946)   OPHTHALMOLOGY EXAM  03/31/2022   HEMOGLOBIN A1C  04/28/2022   FOOT EXAM  07/20/2022   COLONOSCOPY (Pts 45-59yr Insurance  coverage will need to be confirmed)  08/26/2024   Pneumonia Vaccine 76 Years old  Completed   INFLUENZA VACCINE  Completed   DEXA SCAN  Completed   Hepatitis C Screening  Completed   HPV VACCINES  Aged Out    Health Maintenance  Health Maintenance Due  Topic Date Due   Zoster Vaccines- Shingrix (1 of 2) Never done   URINE MICROALBUMIN  08/11/2019    Colorectal cancer screening: Type of screening: Colonoscopy. Completed 08/27/19. Repeat every 5 years  Mammogram status: No longer required due to not a candidate .  Bone Density status: Completed 05/03/21. Results reflect: Bone density results: OSTEOPOROSIS. Repeat every 2 years.  LAdditional Screening:  Hepatitis C Screening:  Completed 03/03/16  Vision Screening: Recommended annual ophthalmology exams for early detection of glaucoma and other disorders of the eye. Is the patient up to date with their annual eye exam?  Yes  Who is the provider or what is the name of the office in which the patient attends annual eye exams? Forgot providers name  If pt is not established with a provider, would they like to be referred to a provider to establish care? No .   Dental Screening: Recommended annual dental exams for proper oral hygiene  Community Resource Referral / Chronic Care Management: CRR required this visit?  No   CCM required this visit?  No      Plan:     I have personally reviewed and noted the following in the patients chart:   Medical and social history Use of alcohol, tobacco or illicit drugs  Current medications and supplements including opioid prescriptions.  Functional ability and status Nutritional status Physical activity Advanced directives List of other physicians Hospitalizations, surgeries, and ER visits in previous 12 months Vitals Screenings to include cognitive, depression, and falls Referrals and appointments  In addition, I have reviewed and discussed with patient certain preventive protocols,  quality metrics, and best practice recommendations. A written personalized care plan for preventive services as well as general preventive health recommendations were provided to patient.     Willette Brace, LPN   [id number]/0/3009   Nurse Notes: none

## 2021-12-22 NOTE — Patient Instructions (Signed)
[name] , Thank you for taking time to come for your Medicare Wellness Visit. I appreciate your ongoing commitment to your health goals. Please review the following plan we discussed and let me know if I can assist you in the future.   Screening recommendations/referrals: Colonoscopy: Done 08/27/19 repeat every 5 years  Mammogram: no longer required  Bone Density: Done 05/03/21 repeat every 2 years  Recommended yearly ophthalmology/optometry visit for glaucoma screening and checkup Recommended yearly dental visit for hygiene and checkup  Vaccinations: Influenza vaccine: Done 07/20/21  repeat every year  Pneumococcal vaccine: Up to date Tdap vaccine: not a candidate  Shingles vaccine: Shingrix discussed. Please contact your pharmacy for coverage information.    Covid-19:Declined and discussed   Advanced directives: Copies in chart  Conditions/risks identified: None   Next appointment: Follow up in one year for your annual wellness visit    Preventive Care 65 Years and Older, Female Preventive care refers to lifestyle choices and visits with your health care provider that can promote health and wellness. What does preventive care include? A yearly physical exam. This is also called an annual well check. Dental exams once or twice a year. Routine eye exams. Ask your health care provider how often you should have your eyes checked. Personal lifestyle choices, including: Daily care of your teeth and gums. Regular physical activity. Eating a healthy diet. Avoiding tobacco and drug use. Limiting alcohol use. Practicing safe sex. Taking low-dose aspirin every day. Taking vitamin and mineral supplements as recommended by your health care provider. What happens during an annual well check? The services and screenings done by your health care provider during your annual well check will depend on your age, overall health, lifestyle risk factors, and family history of disease. Counseling   Your health care provider may ask you questions about your: Alcohol use. Tobacco use. Drug use. Emotional well-being. Home and relationship well-being. Sexual activity. Eating habits. History of falls. Memory and ability to understand (cognition). Work and work Statistician. Reproductive health. Screening  You may have the following tests or measurements: Height, weight, and BMI. Blood pressure. Lipid and cholesterol levels. These may be checked every 5 years, or more frequently if you are over 110 years old. Skin check. Lung cancer screening. You may have this screening every year starting at age 40 if you have a 30-pack-year history of smoking and currently smoke or have quit within the past 15 years. Fecal occult blood test (FOBT) of the stool. You may have this test every year starting at age 76. Flexible sigmoidoscopy or colonoscopy. You may have a sigmoidoscopy every 5 years or a colonoscopy every 10 years starting at age 64. Hepatitis C blood test. Hepatitis B blood test. Sexually transmitted disease (STD) testing. Diabetes screening. This is done by checking your blood sugar (glucose) after you have not eaten for a while (fasting). You may have this done every 1-3 years. Bone density scan. This is done to screen for osteoporosis. You may have this done starting at age 51. Mammogram. This may be done every 1-2 years. Talk to your health care provider about how often you should have regular mammograms. Talk with your health care provider about your test results, treatment options, and if necessary, the need for more tests. Vaccines  Your health care provider may recommend certain vaccines, such as: Influenza vaccine. This is recommended every year. Tetanus, diphtheria, and acellular pertussis (Tdap, Td) vaccine. You may need a Td booster every 10 years. Zoster vaccine. You  may need this after age 57. Pneumococcal 13-valent conjugate (PCV13) vaccine. One dose is recommended  after age 44. Pneumococcal polysaccharide (PPSV23) vaccine. One dose is recommended after age 69. Talk to your health care provider about which screenings and vaccines you need and how often you need them. This information is not intended to replace advice given to you by your health care provider. Make sure you discuss any questions you have with your health care provider. Document Released: 11/06/2015 Document Revised: 06/29/2016 Document Reviewed: 08/11/2015 Elsevier Interactive Patient Education  2017 [place] Prevention in the Home Falls can cause injuries. They can happen to people of all ages. There are many things you can do to make your home safe and to help prevent falls. What can I do on the outside of my home? Regularly fix the edges of walkways and driveways and fix any cracks. Remove anything that might make you trip as you walk through a door, such as a raised step or threshold. Trim any bushes or trees on the path to your home. Use bright outdoor lighting. Clear any walking paths of anything that might make someone trip, such as rocks or tools. Regularly check to see if handrails are loose or broken. Make sure that both sides of any steps have handrails. Any raised decks and porches should have guardrails on the edges. Have any leaves, snow, or ice cleared regularly. Use sand or salt on walking paths during winter. Clean up any spills in your garage right away. This includes oil or grease spills. What can I do in the bathroom? Use night lights. Install grab bars by the toilet and in the tub and shower. Do not use towel bars as grab bars. Use non-skid mats or decals in the tub or shower. If you need to sit down in the shower, use a plastic, non-slip stool. Keep the floor dry. Clean up any water that spills on the floor as soon as it happens. Remove soap buildup in the tub or shower regularly. Attach bath mats securely with double-sided non-slip rug tape. Do not  have throw rugs and other things on the floor that can make you trip. What can I do in the bedroom? Use night lights. Make sure that you have a light by your bed that is easy to reach. Do not use any sheets or blankets that are too big for your bed. They should not hang down onto the floor. Have a firm chair that has side arms. You can use this for support while you get dressed. Do not have throw rugs and other things on the floor that can make you trip. What can I do in the kitchen? Clean up any spills right away. Avoid walking on wet floors. Keep items that you use a lot in easy-to-reach places. If you need to reach something above you, use a strong step stool that has a grab bar. Keep electrical cords out of the way. Do not use floor polish or wax that makes floors slippery. If you must use wax, use non-skid floor wax. Do not have throw rugs and other things on the floor that can make you trip. What can I do with my stairs? Do not leave any items on the stairs. Make sure that there are handrails on both sides of the stairs and use them. Fix handrails that are broken or loose. Make sure that handrails are as long as the stairways. Check any carpeting to make sure that it is firmly  attached to the stairs. Fix any carpet that is loose or worn. Avoid having throw rugs at the top or bottom of the stairs. If you do have throw rugs, attach them to the floor with carpet tape. Make sure that you have a light switch at the top of the stairs and the bottom of the stairs. If you do not have them, ask someone to add them for you. What else can I do to help prevent falls? Wear shoes that: Do not have high heels. Have rubber bottoms. Are comfortable and fit you well. Are closed at the toe. Do not wear sandals. If you use a stepladder: Make sure that it is fully opened. Do not climb a closed stepladder. Make sure that both sides of the stepladder are locked into place. Ask someone to hold it for  you, if possible. Clearly mark and make sure that you can see: Any grab bars or handrails. First and last steps. Where the edge of each step is. Use tools that help you move around (mobility aids) if they are needed. These include: Canes. Walkers. Scooters. Crutches. Turn on the lights when you go into a dark area. Replace any light bulbs as soon as they burn out. Set up your furniture so you have a clear path. Avoid moving your furniture around. If any of your floors are uneven, fix them. If there are any pets around you, be aware of where they are. Review your medicines with your doctor. Some medicines can make you feel dizzy. This can increase your chance of falling. Ask your doctor what other things that you can do to help prevent falls. This information is not intended to replace advice given to you by your health care provider. Make sure you discuss any questions you have with your health care provider. Document Released: 08/06/2009 Document Revised: 03/17/2016 Document Reviewed: 11/14/2014 Elsevier Interactive Patient Education  2017 [employer].

## 2021-12-23 DIAGNOSIS — E1165 Type 2 diabetes mellitus with hyperglycemia: Secondary | ICD-10-CM | POA: Diagnosis not present

## 2021-12-23 DIAGNOSIS — D509 Iron deficiency anemia, unspecified: Secondary | ICD-10-CM | POA: Diagnosis not present

## 2021-12-23 DIAGNOSIS — F03A3 Unspecified dementia, mild, with mood disturbance: Secondary | ICD-10-CM | POA: Diagnosis not present

## 2021-12-23 DIAGNOSIS — J302 Other seasonal allergic rhinitis: Secondary | ICD-10-CM | POA: Diagnosis not present

## 2021-12-23 DIAGNOSIS — K219 Gastro-esophageal reflux disease without esophagitis: Secondary | ICD-10-CM | POA: Diagnosis not present

## 2021-12-23 DIAGNOSIS — E11319 Type 2 diabetes mellitus with unspecified diabetic retinopathy without macular edema: Secondary | ICD-10-CM | POA: Diagnosis not present

## 2021-12-23 DIAGNOSIS — F32A Depression, unspecified: Secondary | ICD-10-CM | POA: Diagnosis not present

## 2021-12-23 DIAGNOSIS — M19041 Primary osteoarthritis, right hand: Secondary | ICD-10-CM | POA: Diagnosis not present

## 2021-12-23 DIAGNOSIS — M19042 Primary osteoarthritis, left hand: Secondary | ICD-10-CM | POA: Diagnosis not present

## 2021-12-23 DIAGNOSIS — Z85118 Personal history of other malignant neoplasm of bronchus and lung: Secondary | ICD-10-CM | POA: Diagnosis not present

## 2021-12-23 DIAGNOSIS — E871 Hypo-osmolality and hyponatremia: Secondary | ICD-10-CM | POA: Diagnosis not present

## 2021-12-23 DIAGNOSIS — I509 Heart failure, unspecified: Secondary | ICD-10-CM | POA: Diagnosis not present

## 2021-12-23 DIAGNOSIS — E785 Hyperlipidemia, unspecified: Secondary | ICD-10-CM | POA: Diagnosis not present

## 2021-12-23 DIAGNOSIS — I447 Left bundle-branch block, unspecified: Secondary | ICD-10-CM | POA: Diagnosis not present

## 2021-12-23 DIAGNOSIS — J441 Chronic obstructive pulmonary disease with (acute) exacerbation: Secondary | ICD-10-CM | POA: Diagnosis not present

## 2021-12-23 DIAGNOSIS — D631 Anemia in chronic kidney disease: Secondary | ICD-10-CM | POA: Diagnosis not present

## 2021-12-23 DIAGNOSIS — I130 Hypertensive heart and chronic kidney disease with heart failure and stage 1 through stage 4 chronic kidney disease, or unspecified chronic kidney disease: Secondary | ICD-10-CM | POA: Diagnosis not present

## 2021-12-23 DIAGNOSIS — E0590 Thyrotoxicosis, unspecified without thyrotoxic crisis or storm: Secondary | ICD-10-CM | POA: Diagnosis not present

## 2021-12-23 DIAGNOSIS — E1122 Type 2 diabetes mellitus with diabetic chronic kidney disease: Secondary | ICD-10-CM | POA: Diagnosis not present

## 2021-12-23 DIAGNOSIS — H401130 Primary open-angle glaucoma, bilateral, stage unspecified: Secondary | ICD-10-CM | POA: Diagnosis not present

## 2021-12-23 DIAGNOSIS — F03A4 Unspecified dementia, mild, with anxiety: Secondary | ICD-10-CM | POA: Diagnosis not present

## 2021-12-23 DIAGNOSIS — N183 Chronic kidney disease, stage 3 unspecified: Secondary | ICD-10-CM | POA: Diagnosis not present

## 2021-12-23 DIAGNOSIS — U071 COVID-19: Secondary | ICD-10-CM | POA: Diagnosis not present

## 2021-12-23 DIAGNOSIS — I2510 Atherosclerotic heart disease of native coronary artery without angina pectoris: Secondary | ICD-10-CM | POA: Diagnosis not present

## 2021-12-27 ENCOUNTER — Other Ambulatory Visit: Payer: Self-pay | Admitting: *Deleted

## 2021-12-27 NOTE — Patient Outreach (Signed)
[facility]) Care Management Telephonic RN Care Manager Note   12/27/2021 Name:  [name] MRN:  [id number] DOB:  [birth year]  Summary: [name] call placed to member's daughter, successful.  Denies any urgent concerns, encouraged to contact this care manager with questions.    Subjective: [name] is an 76 y.o. year old female who is a primary patient of McGowen, [name], MD. The care management team was consulted for assistance with care management and/or care coordination needs.    Telephonic RN Care Manager completed Telephone Visit today.  Objective:   Medications Reviewed Today     Reviewed by Willette Brace, LPN (Licensed Practical Nurse) on 12/22/21 at [place] List Status: <None>   Medication Order Taking? Sig Documenting Provider Last Dose Status Informant  albuterol (PROVENTIL) (2.5 MG/3ML) 0.083% nebulizer solution [id number] Yes USE 1 VIAL IN NEBULIZER EVERY 4 HOURS AS NEEDED FOR WHEEZING AND FOR SHORTNESS OF BREATH  Patient taking differently: Take 2.5 mg by nebulization every 4 (four) hours as needed for wheezing or shortness of breath.   McGowen, [name], MD Taking Active Family Member  albuterol (VENTOLIN HFA) 108 (90 Base) MCG/ACT inhaler [id number] Yes INHALE 2 PUFFS BY MOUTH EVERY 6 HOURS AS NEEDED FOR WHEEZING OR SHORTNESS OF BREATH  Patient taking differently: 2 puffs every 6 (six) hours as needed for wheezing.   Rachael Sou, MD Taking Active Family Member  aspirin 81 MG EC tablet [id number] Yes Take 81 mg by mouth daily.  [provider] Taking Active Family Member  atorvastatin (LIPITOR) 40 MG tablet [id number] Yes Take 1 tablet by mouth once daily  Patient taking differently: Take 40 mg by mouth daily.   McGowen, [name], MD Taking Active Family Member  buPROPion (WELLBUTRIN XL) 150 MG 24 hr tablet [id number] Yes Take 1 tablet by mouth once daily McGowen, [name], MD Taking Active   Calcium Carbonate-Vit  D-Min (CALTRATE 600+D PLUS MINERALS) 600-800 MG-UNIT TABS [id number] Yes Take 1-2 tablets by mouth See admin instructions. Takes 1 tablet in the morning and 2 tablets at night [provider] Taking Active Family Member  cetirizine (ZYRTEC) 10 MG tablet [id number] Yes Take 10 mg by mouth daily. [provider] Taking Active Family Member  escitalopram (LEXAPRO) 20 MG tablet [id number] Yes Take 1 tablet by mouth once daily McGowen, [name], MD Taking Active   fluticasone (FLOVENT HFA) 220 MCG/ACT inhaler [id number] Yes Inhale 2 puffs into the lungs 2 (two) times daily. Rachael Sou, MD Taking Active Family Member  ibandronate (BONIVA) 150 MG tablet [id number] Yes Take 1 tablet (150 mg total) by mouth every 30 (thirty) days. Take in the morning with a full glass of water, on an empty stomach, and do not take anything else by mouth or lie down for the next 30 min. McGowen, [name], MD Taking Active Family Member  insulin isophane & regular human (HUMULIN 70/30 KWIKPEN) (70-30) 100 UNIT/ML KwikPen [id number] Yes INJECT 4 UNITS SUBCUTANEOUSLY WITH BREAKFAST AND 2 UNITS WITH SUPPER  Patient taking differently: 4 Units 2 (two) times daily. 4 units breakfast 4 units  for supper   McGowen, [name], MD Taking Active Family Member  Lancets (ONETOUCH DELICA PLUS QQPYPP50D) [place] [id number] Yes USE 1  TO CHECK GLUCOSE THREE TIMES DAILY McGowen, [name], MD Taking Active Family Member  metoprolol tartrate (LOPRESSOR) 25 MG tablet [id number] Yes Take 1/2 (one-half) tablet by mouth twice daily  Patient taking differently: Take 12.5  mg by mouth 2 (two) times daily.   Rachael Sou, MD Taking Active Family Member  Multiple Vitamins-Minerals (MULTIVITAMIN WOMEN PO) [id number] Yes Take 1 tablet by mouth daily. [provider] Taking Active Family Member  [facility] ULTRA test strip [id number] Yes USE 1 STRIP TO CHECK GLUCOSE THREE TIMES DAILY McGowen, [name], MD Taking Active Family Member   pantoprazole (PROTONIX) 40 MG tablet [id number] Yes Take 1 tablet by mouth once daily  Patient taking differently: 40 mg daily.   McGowen, [name], MD Taking Active Family Member             SDOH:  (Social Determinants of Health) assessments and interventions performed:     Care Plan  Review of patient past medical history, allergies, medications, health status, including review of consultants reports, laboratory and other test data, was performed as part of comprehensive evaluation for care management services.   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  Noted per chart, HH sessions have been approved for extension demonstrate a decrease in CHF, COPD, and DMII exacerbations    through collaboration with RN Care manager, provider, and care team.   Interventions: Inter-disciplinary care team collaboration (see longitudinal plan of care) Evaluation of current treatment  plan related to  self management and patient's adherence to plan as established by provider   Diabetes Interventions: Assessed patient's understanding of A1c goal: <6.5% Provided education to patient about basic DM disease process; Discussed plans with patient for ongoing care management follow up and provided patient with direct contact information for care management team; Advised patient, providing education and rationale, to check cbg daily and record, calling PCP for findings outside established parameters; Screening for signs and symptoms of depression related to chronic disease state;  Lab Results  Component Value Date   HGBA1C 6.5 (H) 10/29/2021   COPD Interventions:   Provided patient with basic written and verbal COPD education on self care/management/and exacerbation prevention; Advised patient to track and manage COPD triggers;  Advised patient to self assesses COPD action plan zone and make appointment with provider if in the yellow zone for 48 hours without improvement; Discussed the importance of adequate rest and management of fatigue with COPD; Screening for signs and symptoms of depression related to chronic disease state;  Advised daughter to call PCP office as long nodule was noted on most recent CXR, recommended to have CT scan, not scheduled yet.  She was provided antibiotics and steroids  recently for pneumonia, per daughter, this has improved.  Heart Failure Interventions: Basic overview and discussion of pathophysiology of Heart Failure reviewed; Provided education on low sodium diet; Provided education about placing scale on hard, flat surface; Advised patient to weigh each morning after emptying bladder; Discussed the importance of keeping all appointments with provider;  Patient Goals/Self-Care Activities: Patient will self administer medications as prescribed Patient will attend all scheduled provider appointments Patient will continue to perform ADL's  independently  Follow Up Plan:  Telephone follow up appointment with care management team member scheduled for:  01/28/2022   Update 11/21 - Per daughter, member is "ok."  State she has decreased strength in the last couple weeks as home health PT has completed.  She was told that they would request an extension but has not heard anything back from office.  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  She had chest CT done last week, waiting call from PCP office for follow up and test results.  Call placed to [facility] to follow up on renewal of PT order, state they do not have an open case for member currently and would need new orders.  Call placed to PCP office, message left requesting call back and/or new orders for home health PT.     Update 12/9 - Daughter report member is about the same.  She has not been able to get her to consistently do strengthening exercises independently at home.  Advised that this care manager was able to speak with Vevelyn Royals at PCP office.  They attempted to provide referral to [facility] however they were told that insurance would not approve it as member has not had a change in status, such as repeat hospital admission.  Daughter verbalizes understanding, state she is off the next 2 weeks and will work with her on exercises provided by PT.  Hoping to help member improve strength, if member declines will speak with PCP and have reassessment done.    Update 1/11 - Spoke with daughter, state member was admitted to hospital for 2 days since last outreach, diagnosed with Covid and UTI.  Report member became weak and unable to stand/walk, increased confusion.  Was treated with antibiotics, steroids, and antiviral, state member is much better now.  She has increased in strength and back to her mental baseline.  She now has home health for PT and OT, will have next visit today.  Was also seen by PCP  last week for follow up, no changes in plan of care, will revisit in 3 months.   Update 2/9 - Per daughter, member has been doing well since recent discharge.  She is no longer having symptoms of her Covid or UTI infections, antibiotic courses complete.  Continues to have home health PT, daughter feels this has helped tremendously.  Blood sugars and blood pressure remain stable, A1C now at goal of 6.5.   Update 3/6 - Call placed to daughter, state member continues to improve.  Completed home health PT/OT last week, she is working to have member continue home exercises to maintain strength.  She had AWV last week, due to PCP follow up next month.  Daughter currently at work, will call once she is off to schedule.  Remains adherent to diabetic diet, blood sugars within range.         Plan:  Telephone follow up appointment with care  management team member scheduled for:  1 month The patient has been provided with contact information for the care management team and has been advised to call with any health related questions or concerns.   [name], RN, MSN, [place] Manager [phone]

## 2022-01-08 ENCOUNTER — Other Ambulatory Visit: Payer: Self-pay | Admitting: Family Medicine

## 2022-01-23 ENCOUNTER — Other Ambulatory Visit: Payer: Self-pay | Admitting: Family Medicine

## 2022-01-28 ENCOUNTER — Other Ambulatory Visit: Payer: Self-pay | Admitting: *Deleted

## 2022-01-28 NOTE — Patient Instructions (Signed)
Visit Information ? ?Thank you for taking time to visit with me today. Please don't hesitate to contact me if I can be of assistance to you before our next scheduled telephone appointment. ? ?Following are the goals we discussed today:  ?Call PCP office to schedule follow up appointment. ? ? ?Our next appointment is by telephone in 1 month ? ?The patient verbalized understanding of instructions, educational materials, and care plan provided today and agreed to receive a mailed copy of patient instructions, educational materials, and care plan.  ? ?The patient has been provided with contact information for the care management team and has been advised to call with any health related questions or concerns.  ? ?[name], RN, MSN, CCM ?[facility] Care Management  ?Community Care Manager ?[phone] ? ? ? ? ?

## 2022-01-28 NOTE — Patient Outreach (Signed)
?[facility]) Care Management ?Telephonic RN Care Manager Note ? ? ?01/28/2022 ?Name:  [name] MRN:  [id number] DOB:  [birth year] ? ?Summary: ?Outgoing call placed to member's daughter, successful.  Denies any urgent concerns, encouraged to contact this care manager with questions.   ? ?Recommendations/Changes made from today's visit: ?Continue to work on maintain strength.  ?Monitor weight, blood sugar, and blood pressure daily.   ? ?Subjective: ?[name] is an 76 y.o. year old female who is a primary patient of McGowen, [name], MD. The care management team was consulted for assistance with care management and/or care coordination needs.   ? ?Telephonic RN Care Manager completed Telephone Visit today. ? ?Objective:  ? ?Medications Reviewed Today   ? ? Reviewed by Willette Brace, LPN (Licensed Practical Nurse) on 12/22/21 at 1509  Med List Status: <None>  ? ?Medication Order Taking? Sig Documenting Provider Last Dose Status Informant  ?albuterol (PROVENTIL) (2.5 MG/3ML) 0.083% nebulizer solution [id number] Yes USE 1 VIAL IN NEBULIZER EVERY 4 HOURS AS NEEDED FOR WHEEZING AND FOR SHORTNESS OF BREATH  ?Patient taking differently: Take 2.5 mg by nebulization every 4 (four) hours as needed for wheezing or shortness of breath.  ? McGowen, [name], MD Taking Active Family Member  ?albuterol (VENTOLIN HFA) 108 (90 Base) MCG/ACT inhaler [id number] Yes INHALE 2 PUFFS BY MOUTH EVERY 6 HOURS AS NEEDED FOR WHEEZING OR SHORTNESS OF BREATH  ?Patient taking differently: 2 puffs every 6 (six) hours as needed for wheezing.  ? McGowen, [name], MD Taking Active Family Member  ?aspirin 81 MG EC tablet [id number] Yes Take 81 mg by mouth daily.  [provider] Taking Active Family Member  ?atorvastatin (LIPITOR) 40 MG tablet [id number] Yes Take 1 tablet by mouth once daily  ?Patient taking differently: Take 40 mg by mouth daily.  ? McGowen, [name], MD Taking Active Family Member   ?buPROPion (WELLBUTRIN XL) 150 MG 24 hr tablet [id number] Yes Take 1 tablet by mouth once daily McGowen, [name], MD Taking Active   ?Calcium Carbonate-Vit D-Min (CALTRATE 600+D PLUS MINERALS) 600-800 MG-UNIT TABS [id number] Yes Take 1-2 tablets by mouth See admin instructions. Takes 1 tablet in the morning and 2 tablets at night [provider] Taking Active Family Member  ?cetirizine (ZYRTEC) 10 MG tablet [id number] Yes Take 10 mg by mouth daily. [provider] Taking Active Family Member  ?escitalopram (LEXAPRO) 20 MG tablet [id number] Yes Take 1 tablet by mouth once daily McGowen, [name], MD Taking Active   ?fluticasone (FLOVENT HFA) 220 MCG/ACT inhaler [id number] Yes Inhale 2 puffs into the lungs 2 (two) times daily. [name], MD Taking Active Family Member  ?ibandronate (BONIVA) 150 MG tablet [id number] Yes Take 1 tablet (150 mg total) by mouth every 30 (thirty) days. Take in the morning with a full glass of water, on an empty stomach, and do not take anything else by mouth or lie down for the next 30 min. McGowen, [name], MD Taking Active Family Member  ?insulin isophane & regular human (HUMULIN 70/30 KWIKPEN) (70-30) 100 UNIT/ML KwikPen [id number] Yes INJECT 4 UNITS SUBCUTANEOUSLY WITH BREAKFAST AND 2 UNITS WITH SUPPER  ?Patient taking differently: 4 Units 2 (two) times daily. 4 units breakfast 4 units  for supper  ? McGowen, [name], MD Taking Active Family Member  ?Lancets (ONETOUCH DELICA PLUS [id number]) [place] [id number] Yes USE 1  TO CHECK GLUCOSE THREE TIMES DAILY McGowen, [name], MD Taking Active Family Member  ?  metoprolol tartrate (LOPRESSOR) 25 MG tablet [id number] Yes Take 1/2 (one-half) tablet by mouth twice daily  ?Patient taking differently: Take 12.5 mg by mouth 2 (two) times daily.  ? McGowen, [name], MD Taking Active Family Member  ?Multiple Vitamins-Minerals (MULTIVITAMIN WOMEN PO) [id number] Yes Take 1 tablet by mouth daily. [provider] Taking Active  Family Member  ?ONETOUCH ULTRA test strip [id number] Yes USE 1 STRIP TO CHECK GLUCOSE THREE TIMES DAILY McGowen, [name], MD Taking Active Family Member  ?pantoprazole (PROTONIX) 40 MG tablet [id number] Yes Take 1 tablet by mouth once daily  ?Patient taking differently: 40 mg daily.  ? McGowen, [name], MD Taking Active Family Member  ? ?  ?  ? ?  ? ? ? ?SDOH:  (Social Determinants of Health) assessments and interventions performed:  ? ? ? ?Care Plan ? ?Review of patient past medical history, allergies, medications, health status, including review of consultants reports, laboratory and other test data, was performed as part of comprehensive evaluation for care management services.  ? ?Care Plan : RNCM Plan of Care  ?Updates made by [name], RN since 01/28/2022 12:00 AM  ?  ? ?Problem: Chronic care management of multiple chronic conditions (CHF, DM, COPD)   ?Priority: High  ?  ? ?Long-Range Goal: Ability for member and family to adequately manage chronic conditions (CHF, DM, COPD)   ?Start Date: 08/16/2021  ?Expected End Date: 08/16/2022  ?This Visit's Progress: On track  ?Recent Progress: On track  ?Priority: High  ?Note:   ?Current Barriers:  ?Knowledge Deficits related to plan of care for management of CHF, COPD, and DMII ?Chronic Disease Management support and education needs related to CHF, COPD, and DMII ? ?RNCM Clinical Goal(s):  ?Patient will verbalize understanding of plan for management of CHF, COPD, and DMII ?take all medications exactly as prescribed and will call provider for medication related questions ?attend all scheduled medical appointments: PCP ?continue to work with RN Care Manager to address care management and care coordination needs related to  CHF, COPD, and DMII ?work with [facility]  to increase strength and mobility through PT.  Noted per chart, [place] sessions have been approved for extension ?demonstrate a decrease in CHF, COPD, and DMII exacerbations    through collaboration with RN Care  manager, provider, and care team.  ? ?Interventions: ?Inter-disciplinary care team collaboration (see longitudinal plan of care) ?Evaluation of current treatment plan related to  self management and patient's adherence to plan as established by provider ? ? ?Diabetes Interventions: ?Assessed patient's understanding of A1c goal: <6.5% ?Provided education to patient about basic DM disease process; ?Discussed plans with patient for ongoing care management follow up and provided patient with direct contact information for care management team; ?Advised patient, providing education and rationale, to check cbg daily and record, calling PCP for findings outside established parameters; ?Screening for signs and symptoms of depression related to chronic disease state;  ?Lab Results  ?Component Value Date  ? HGBA1C 6.5 (H) 10/29/2021  ? ?COPD Interventions:   ?Provided patient with basic written and verbal COPD education on self care/management/and exacerbation prevention; ?Advised patient to track and manage COPD triggers;  ?Advised patient to self assesses COPD action plan zone and make appointment with provider if in the yellow zone for 48 hours without improvement; ?Discussed the importance of adequate rest and management of fatigue with COPD; ?Screening for signs and symptoms of depression related to chronic disease state;  ?Advised daughter to call PCP office as long  nodule was noted on most recent CXR, recommended to have CT scan, not scheduled yet.  She was provided antibiotics and steroids recently for pneumonia, per daughter, this has improved. ? ?Heart Failure Interventions: ?Basic overview and discussion of pathophysiology of Heart Failure reviewed; ?Provided education on low sodium diet; ?Provided education about placing scale on hard, flat surface; ?Advised patient to weigh each morning after emptying bladder; ?Discussed the importance of keeping all appointments with provider; ? ?Patient Goals/Self-Care  Activities: ?Patient will self administer medications as prescribed ?Patient will attend all scheduled provider appointments ?Patient will continue to perform ADL's independently ? ?Follow Up Plan:  Telephone follow up app

## 2022-02-02 ENCOUNTER — Ambulatory Visit (INDEPENDENT_AMBULATORY_CARE_PROVIDER_SITE_OTHER): Payer: Medicare Other | Admitting: Family Medicine

## 2022-02-02 ENCOUNTER — Encounter: Payer: Self-pay | Admitting: Family Medicine

## 2022-02-02 VITALS — BP 108/66 | HR 87 | Temp 97.6°F | Ht 62.0 in | Wt 143.6 lb

## 2022-02-02 DIAGNOSIS — E119 Type 2 diabetes mellitus without complications: Secondary | ICD-10-CM | POA: Diagnosis not present

## 2022-02-02 DIAGNOSIS — J449 Chronic obstructive pulmonary disease, unspecified: Secondary | ICD-10-CM

## 2022-02-02 DIAGNOSIS — Z794 Long term (current) use of insulin: Secondary | ICD-10-CM | POA: Diagnosis not present

## 2022-02-02 DIAGNOSIS — E118 Type 2 diabetes mellitus with unspecified complications: Secondary | ICD-10-CM | POA: Diagnosis not present

## 2022-02-02 DIAGNOSIS — E7800 Pure hypercholesterolemia, unspecified: Secondary | ICD-10-CM | POA: Diagnosis not present

## 2022-02-02 DIAGNOSIS — I10 Essential (primary) hypertension: Secondary | ICD-10-CM

## 2022-02-02 LAB — HEMOGLOBIN A1C: Hgb A1c MFr Bld: 7 % — ABNORMAL HIGH (ref 4.6–6.5)

## 2022-02-02 LAB — BASIC METABOLIC PANEL
BUN: 25 mg/dL — ABNORMAL HIGH (ref 6–23)
CO2: 29 mEq/L (ref 19–32)
Calcium: 9 mg/dL (ref 8.4–10.5)
Chloride: 100 mEq/L (ref 96–112)
Creatinine, Ser: 1.06 mg/dL (ref 0.40–1.20)
GFR: 52.07 mL/min — ABNORMAL LOW (ref >60.00)
Glucose, Bld: 127 mg/dL — ABNORMAL HIGH (ref 70–99)
Potassium: 4.4 mEq/L (ref 3.5–5.1)
Sodium: 138 mEq/L (ref 135–145)

## 2022-02-02 LAB — LIPID PANEL
Cholesterol: 130 mg/dL (ref 0–200)
HDL: 52.1 mg/dL (ref >39.00)
LDL Cholesterol: 55 mg/dL (ref 0–99)
NonHDL: 77.65
Total CHOL/HDL Ratio: 2
Triglycerides: 111 mg/dL (ref 0.0–149.0)
VLDL: 22.2 mg/dL (ref 0.0–40.0)

## 2022-02-02 MED ORDER — HUMULIN 70/30 KWIKPEN (70-30) 100 UNIT/ML ~~LOC~~ SUPN: PEN_INJECTOR | SUBCUTANEOUS | 1 refills | Status: DC

## 2022-02-02 MED ORDER — ESCITALOPRAM OXALATE 20 MG PO TABS: 20.0000 mg | ORAL_TABLET | Freq: Every day | ORAL | 1 refills | Status: DC

## 2022-02-02 MED ORDER — ATORVASTATIN CALCIUM 40 MG PO TABS: 40.0000 mg | ORAL_TABLET | Freq: Every day | ORAL | 1 refills | Status: DC

## 2022-02-02 MED ORDER — PANTOPRAZOLE SODIUM 40 MG PO TBEC
40.0000 mg | DELAYED_RELEASE_TABLET | Freq: Every day | ORAL | 1 refills | Status: DC
Start: 2022-02-02 — End: 2022-08-03

## 2022-02-02 MED ORDER — ONETOUCH DELICA PLUS LANCET33G MISC: 5 refills | Status: DC

## 2022-02-02 MED ORDER — METOPROLOL TARTRATE 25 MG PO TABS: ORAL_TABLET | ORAL | 1 refills | Status: DC

## 2022-02-02 NOTE — Progress Notes (Signed)
OFFICE VISIT ? ?02/02/2022 ? ?CC:  ?Chief Complaint  ?Patient presents with  ? Diabetes  ? Hypertension  ?  Pt is fasting  ? ? ?Patient is a 76 y.o. female who presents accompanied by her daughter Deloris for 3 mo f/u DM 2, HTN, HLD, and COPD. ?A/P as of last visit: ?"#1 hospitalization follow-up for UTI, COVID-positive, COPD exacerbation. ?She has back to baseline health now. ? ?2.  Diabetes, historically good control. ?Continue 70/30 insulin, 4 units twice a day. ?Hemoglobin A1c and nonfasting glucose today. ? ?3.  Hypertension, well controlled.  Soft blood pressure today but patient asymptomatic.  Continue Lopressor 12.5 mg twice a day. ? ?4.  Hyperlipidemia: Last LDL a year ago was 71.  Doing well on atorvastatin 40 mg a day. ?Not fasting today so will try in 3 months to repeat lipid panel.  Hepatic panel today. ?  ?#5 chronic renal insufficiency, borderline stage III. ?She avoids NSAIDs. ?Electrolytes and creatinine checked today" ? ?INTERIM HX: ?Lavetta is feeling well. ?Home glucoses reviewed: Typically 110s to 130s fasting and 120s to 150s random. ?Occasional excursions up into the 200 range when she has something good to eat. ? ?No home blood pressure monitoring data today. ? ?A little bit of increased cough recently associated with increased pollen exposure. ?No wheezing, no shortness of breath, no chest pain, no fevers ? ?ROS as above, plus--> no dizziness, no HAs, no rashes, no melena/hematochezia.  No polyuria or polydipsia.  No myalgias or arthralgias.  No focal weakness, paresthesias, or tremors.  No acute vision or hearing abnormalities.  No dysuria or unusual/new urinary urgency or frequency.  No recent changes in lower legs. ?No n/v/d or abd pain.  No palpitations.   ? ? ?Past Medical History:  ?Diagnosis Date  ? Acute renal failure ([place])   ? Age-related nuclear cataract of both eyes 2016  ? +cortical age related cataracts OU   ? Anxiety and depression   ? Bilateral diabetic retinopathy ([place]) 2015  ?  Dr. Zigmund Daniel  ? Blood transfusion without reported diagnosis   ? when taking chemo  ? Chronic renal insufficiency, stage 3 (moderate) ([facility])   ? GFR 50s  ? 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 with drain; s/p diverticular abscess w/sepsis.  ? Colonic diverticular abscess   ? COPD (chronic obstructive pulmonary disease) ([place])   ? CVA (cerebral vascular accident) ([place]) 09/2016  ? MRI did show a left-sided small ischemic stroke which was acute but likely incidental (MRI was done b/c pt had TIA sx's in hospital).  Neuro put her on plavix at that time.  Cardiology d/c'd her plavix 08/2017.  ? Diabetes mellitus with complication ([place])   ? diab retinopathy OU (laser)  ? Diverticulitis of large intestine with perforation and abscess 09/11/2016  ? Finger fracture, right 04/2020  ? R 5th metacarpal fx at the base (Dr. Mardelle Matte)  ? GERD (gastroesophageal reflux disease)   ? protonix  ? History of concussion 08/25/2018  ? w/out loss of consciousness--mild increase in baseline memory impairment after.  CT head neg acute.  ? History of diverticulitis of colon   ? with abscess; required IR percutaneous drain placed 09/2016.  ? History of iron deficiency anemia   ? started iron 07/02/19.  EGD/colonoscopy 08/27/19 unrevealing. Capsule endoscopy considered but never done.  ? Hyperlipidemia   ? Hypertension   ? Left bundle branch block (LBBB) 12/16/2016  ? Lung cancer ([place])   ? non-small cell lung ca, stage III in 05/2011; systemic  chemotherapy concurrent with radiation followed by prophylactic cranial irradiation and has been observation since July of 2010 with no evidence for disease recurrence-released from onc f/u 12/2014 (needs annual cxr by PCP).  CXR 08/2015 stable.  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 Earlie Server did bx and this was NEG for malignancy.  ? Mild cognitive impairment with memory loss   ? Likely from brain radiation therapy  ? Non-obstructive CAD (coronary artery disease)   ? a. Cath 2006 preserved LV fxn, scattered  irregularities without critical stenosis; b. 2008 stress echo negative for ischemia, but with hypertensive response; c. 08/2016 Cath: D1 25%, otw nl.  ? Normocytic anemia 2018  ? Iron, vit B12, folate all normal 12/2016.  ? Open-angle glaucoma 2016  ? Dr. Shirley Muscat, (bilateral)---responding to topical therapy  ? Osteoarthritis of both hands   ? Osteoporosis 03/2016; 03/2018  ? 03/2016 DEXA T-score -2.2.  03/2018 T-score L femoral neck  -2.7--boniva started. DEXA 04/2021 T scor -2.6->boniva continued. Plan rpt DEXA 2 yrs.  ? Pneumonia   ? hospitalization 11/2016; hypoxemic resp failure--d/c'd home with home oxygen therapy  ? Pulmonary nodule, left 08/2021  ? LUL 11 mm ground glass, needs f/u CT 08/2022.  ? Seasonal allergic rhinitis   ? Subclinical hyperthyroidism 2018  ? Takotsubo cardiomyopathy   ? a. 08/2016 Echo: EF 30-35%, gr1DD, PASP 23mHg;  b. 08/2016 Cath: nonobs Dzs;  c. 11/2016 Echo: EF 40-45%, no rwma, nl RV fxn, mild TR, PASP 380mg.  ECHO 02/2017 EF 50-55%, grd II DD---essentially resolved Takotsubo 02/2017.  ? [place]  ? a. 08/2016 in setting of diverticulitis & pneumoperitoneum and Takotsubo CM -->prolonged QT, seen by EP-->avoid meds with potential for QT prolongation.  ? ? ?Past Surgical History:  ?Procedure Laterality Date  ? ABDOMINAL HYSTERECTOMY  1997  ? APPENDECTOMY    ? CARDIAC CATHETERIZATION N/A 09/14/2016  ? Procedure: Left Heart Cath and Coronary Angiography;  Surgeon: ThTroy SineMD;  [facility];  Service: Cardiovascular; Mild nonobstructive CAD with 85% smooth narrowing in the first diagonal branch of the LAD; 10% smooth narrowing of the ostial proximal left circumflex coronary artery; and a normal dominant RCA.  ? CARPAL TUNNEL RELEASE    ? CATARACT EXTRACTION Bilateral   ? CHOLECYSTECTOMY  2002  ? COLONOSCOPY  10/24/00; 08/27/19  ? 2002 normal.  [employer] hemoccult testing via [place]/25/16 was NEG.  2020->diverticulosis o/w nl.  ? dexa  03/2016;  03/2018  ? 03/2018 T-score L femoral neck  -2.7 (worsened compared to osteopenic range 2017.) DEXA 05/03/21 stable T score -2.6 on boniva x 2 yrs->plan rpt DEXA 2 yrs.  ? ESOPHAGOGASTRODUODENOSCOPY  08/27/2019  ? Normal.  ? IR GENERIC HISTORICAL  09/19/2016  ? IR SINUS/FIST TUBE CHK-NON GI 09/19/2016 JaCorrie MckusickDO [facility]  ? IR GENERIC HISTORICAL  10/06/2016  ? IR RADIOLOGIST EVAL & MGMT 10/06/2016 [facility]  ? IR GENERIC HISTORICAL  10/26/2016  ? IR RADIOLOGIST EVAL & MGMT 10/26/2016 [facility]  ? IR GENERIC HISTORICAL  11/03/2016  ? IR RADIOLOGIST EVAL & MGMT 11/03/2016 WeArdis RowanPA-C [facility]  ? IR GENERIC HISTORICAL  11/24/2016  ? IR RADIOLOGIST EVAL & MGMT 11/24/2016 WeArdis RowanPA-C [facility]  ? IR GENERIC HISTORICAL  12/05/2016  ? Fistula smaller/improving.  IR SINUS/FIST TUBE CHK-NON GI 12/05/2016 JoSandi MariscalMD [facility]  ? IR GENERIC HISTORICAL  12/22/2016  ? IR RADIOLOGIST EVAL &  MGMT 12/22/2016 Greggory Keen, MD [facility]  ? IR RADIOLOGIST EVAL & MGMT  01/10/2017  ? IR RADIOLOGIST EVAL & MGMT  02/09/2017  ? IR SINUS/FIST TUBE CHK-NON GI  03/28/2017  ? LEFT HEART CATHETERIZATION WITH CORONARY ANGIOGRAM N/A 05/30/2012  ? Procedure: LEFT HEART CATHETERIZATION WITH CORONARY ANGIOGRAM;  Surgeon: Burnell Blanks, MD;  Location[facility] C[facility] LAB;  Service: Cardiovascular: Mild, non-obstructive CAD  ? TRANSTHORACIC ECHOCARDIOGRAM  09/14/2016; 11/2016  ? 08/2016: EF 30-35 %, Akinesis of the mid-apicalanteroseptal myocardium.  Grade I DD.  Mild pulm HTN.  Repeat echo 11/2016, EF 40-45%, no rwma, nl RV fxn, mild TR, PASP 36mHg.    ? TRANSTHORACIC ECHOCARDIOGRAM  03/07/2017  ? EF 50-55%. Hypokinesis of the distal septum with overall low normal LV,  systolic function; mild diastolic dysfunction with elevated LV  filling pressure; mildly calcified aortic valve with mild AI; small pericardial effusion  ? ? ?Outpatient Medications Prior to Visit  ?Medication  Sig Dispense Refill  ? albuterol (PROVENTIL) (2.5 MG/3ML) 0.083% nebulizer solution USE 1 VIAL IN NEBULIZER EVERY 4 HOURS AS NEEDED FOR WHEEZING AND FOR SHORTNESS OF BREATH (Patient taking differently: Take 2.5 mg by

## 2022-02-25 ENCOUNTER — Other Ambulatory Visit: Payer: Self-pay | Admitting: *Deleted

## 2022-02-25 NOTE — Patient Outreach (Signed)
?[facility]) Care Management ?Telephonic RN Care Manager Note ? ? ?02/25/2022 ?Name:  [name] MRN:  [id number] DOB:  [birth year] ? ?Summary: ?Outgoing call placed to member's daughter, successful.  Denies any urgent concerns, encouraged to contact this care manager with questions.   ? ? ?Subjective: ?[name] is an 76 y.o. year old female who is a primary patient of McGowen, [name], MD. The care management team was consulted for assistance with care management and/or care coordination needs.   ? ?Telephonic RN Care Manager completed Telephone Visit today. ? ?Objective:  ? ?Medications Reviewed Today   ? ? Reviewed by [name], MD (Physician) on 02/02/22 at 1010  Med List Status: <None>  ? ?Medication Order Taking? Sig Documenting Provider Last Dose Status Informant  ?albuterol (PROVENTIL) (2.5 MG/3ML) 0.083% nebulizer solution [id number] Yes USE 1 VIAL IN NEBULIZER EVERY 4 HOURS AS NEEDED FOR WHEEZING AND FOR SHORTNESS OF BREATH  ?Patient taking differently: Take 2.5 mg by nebulization every 4 (four) hours as needed for wheezing or shortness of breath.  ? McGowen, [name], MD Taking Active Family Member  ?albuterol (VENTOLIN HFA) 108 (90 Base) MCG/ACT inhaler [id number] Yes INHALE 2 PUFFS BY MOUTH EVERY 6 HOURS AS NEEDED FOR WHEEZING OR SHORTNESS OF BREATH  ?Patient taking differently: 2 puffs every 6 (six) hours as needed for wheezing.  ? McGowen, [name], MD Taking Active Family Member  ?aspirin 81 MG EC tablet [id number] Yes Take 81 mg by mouth daily.  [provider] Taking Active Family Member  ?atorvastatin (LIPITOR) 40 MG tablet [id number] Yes Take 1 tablet by mouth once daily McGowen, [name], MD Taking Active   ?buPROPion (WELLBUTRIN XL) 150 MG 24 hr tablet [id number] Yes Take 1 tablet by mouth once daily McGowen, [name], MD Taking Active   ?Calcium Carbonate-Vit D-Min (CALTRATE 600+D PLUS MINERALS) 600-800 MG-UNIT TABS [id number] Yes Take 1-2 tablets  by mouth See admin instructions. Takes 1 tablet in the morning and 2 tablets at night [provider] Taking Active Family Member  ?cetirizine (ZYRTEC) 10 MG tablet [id number] Yes Take 10 mg by mouth daily. [provider] Taking Active Family Member  ?escitalopram (LEXAPRO) 20 MG tablet [id number] Yes Take 1 tablet by mouth once daily McGowen, [name], MD Taking Active   ?fluticasone (FLOVENT HFA) 220 MCG/ACT inhaler [id number] Yes Inhale 2 puffs into the lungs 2 (two) times daily. [name], MD Taking Active Family Member  ?ibandronate (BONIVA) 150 MG tablet [id number] Yes Take 1 tablet (150 mg total) by mouth every 30 (thirty) days. Take in the morning with a full glass of water, on an empty stomach, and do not take anything else by mouth or lie down for the next 30 min. McGowen, [name], MD Taking Active Family Member  ?insulin isophane & regular human (HUMULIN 70/30 KWIKPEN) (70-30) 100 UNIT/ML KwikPen [id number] Yes INJECT 4 UNITS SUBCUTANEOUSLY WITH BREAKFAST AND 2 UNITS WITH SUPPER  ?Patient taking differently: 4 Units 2 (two) times daily. 4 units breakfast 4 units  for supper  ? McGowen, [name], MD Taking Active Family Member  ?Lancets (ONETOUCH DELICA PLUS [id number]) [place] [id number] Yes USE 1  TO CHECK GLUCOSE THREE TIMES DAILY McGowen, [name], MD Taking Active Family Member  ?metoprolol tartrate (LOPRESSOR) 25 MG tablet [id number] Yes Take 1/2 (one-half) tablet by mouth twice daily McGowen, [name], MD Taking Active   ?Multiple Vitamins-Minerals (MULTIVITAMIN WOMEN PO) [id number] Yes Take 1 tablet by mouth  daily. [provider] Taking Active Family Member  ?ONETOUCH ULTRA test strip [id number] Yes USE 1 STRIP TO CHECK GLUCOSE THREE TIMES DAILY McGowen, [name], MD Taking Active Family Member  ?pantoprazole (PROTONIX) 40 MG tablet [id number] Yes Take 1 tablet by mouth once daily McGowen, [name], MD Taking Active   ? ?  ?  ? ?  ? ? ? ?SDOH:  (Social Determinants of  Health) assessments and interventions performed:  ? ? ? ?Care Plan ? ?Review of patient past medical history, allergies, medications, health status, including review of consultants reports, laboratory and other test data, was performed as part of comprehensive evaluation for care management services.  ? ?Care Plan : RNCM Plan of Care  ?Updates made by [name], RN since 02/25/2022 12:00 AM  ?  ? ?Problem: Chronic care management of multiple chronic conditions (CHF, DM, COPD)   ?Priority: High  ?  ? ?Long-Range Goal: Ability for member and family to adequately manage chronic conditions (CHF, DM, COPD)   ?Start Date: 08/16/2021  ?Expected End Date: 08/16/2022  ?This Visit's Progress: On track  ?Recent Progress: On track  ?Priority: High  ?Note:   ?Current Barriers:  ?Knowledge Deficits related to plan of care for management of CHF, COPD, and DMII ?Chronic Disease Management support and education needs related to CHF, COPD, and DMII ? ?RNCM Clinical Goal(s):  ?Patient will verbalize understanding of plan for management of CHF, COPD, and DMII ?take all medications exactly as prescribed and will call provider for medication related questions ?attend all scheduled medical appointments: PCP ?continue to work with RN Care Manager to address care management and care coordination needs related to  CHF, COPD, and DMII ?work with [facility]  to increase strength and mobility through PT.  Noted per chart, [place] sessions have been approved for extension ?demonstrate a decrease in CHF, COPD, and DMII exacerbations    through collaboration with RN Care manager, provider, and care team.  ? ?Interventions: ?Inter-disciplinary care team collaboration (see longitudinal plan of care) ?Evaluation of current treatment plan related to  self management and patient's adherence to plan as established by provider ? ? ?Diabetes Interventions: ?Assessed patient's understanding of A1c goal: <6.5% ?Provided education to patient about basic DM disease  process; ?Discussed plans with patient for ongoing care management follow up and provided patient with direct contact information for care management team; ?Advised patient, providing education and rationale, to check cbg daily and record, calling PCP for findings outside established parameters; ?Screening for signs and symptoms of depression related to chronic disease state;  ?Lab Results  ?Component Value Date  ? HGBA1C 7.0 (H) 02/02/2022  ? ?COPD Interventions:   ?Provided patient with basic written and verbal COPD education on self care/management/and exacerbation prevention ?Advised patient to self assesses COPD action plan zone and make appointment with provider if in the yellow zone for 48 hours without improvement ?Discussed the importance of adequate rest and management of fatigue with COPD ? ?Heart Failure Interventions: ?Basic overview and discussion of pathophysiology of Heart Failure reviewed; ?Provided education on low sodium diet; ?Provided education about placing scale on hard, flat surface; ?Advised patient to weigh each morning after emptying bladder; ?Discussed the importance of keeping all appointments with provider; ? ?Patient Goals/Self-Care Activities: ?Patient will self administer medications as prescribed ?Patient will attend all scheduled provider appointments ?Patient will continue to perform ADL's independently ? ?Follow Up Plan:  Telephone follow up appointment with care management team member scheduled for:  04/25/2022 ? ? ?  Update 11/21 - Per daughter, member is "ok."  State she has decreased strength in the last couple weeks as home health PT has completed.  She was told that they would request an extension but has not heard anything back from office.  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  She had chest CT done last week, waiting call from PCP office for follow up and test results.  Call placed to [facility]  to follow up on renewal of PT order, state they do not have an open case for member currently and would need new orders.  Call placed to PCP office, message left requesting call back and/or new orders for home

## 2022-03-06 ENCOUNTER — Emergency Department (HOSPITAL_COMMUNITY): Payer: Medicare Other

## 2022-03-06 ENCOUNTER — Other Ambulatory Visit: Payer: Self-pay

## 2022-03-06 ENCOUNTER — Encounter (HOSPITAL_COMMUNITY): Payer: Self-pay

## 2022-03-06 ENCOUNTER — Emergency Department (HOSPITAL_COMMUNITY)
Admission: EM | Admit: 2022-03-06 | Discharge: 2022-03-06 | Disposition: A | Discharge status: Home / Self Care | Payer: Medicare Other | Attending: Emergency Medicine | Admitting: Emergency Medicine

## 2022-03-06 DIAGNOSIS — R0902 Hypoxemia: Secondary | ICD-10-CM | POA: Diagnosis not present

## 2022-03-06 DIAGNOSIS — R0981 Nasal congestion: Secondary | ICD-10-CM | POA: Insufficient documentation

## 2022-03-06 DIAGNOSIS — R059 Cough, unspecified: Secondary | ICD-10-CM | POA: Insufficient documentation

## 2022-03-06 DIAGNOSIS — S4991XA Unspecified injury of right shoulder and upper arm, initial encounter: Secondary | ICD-10-CM | POA: Diagnosis present

## 2022-03-06 DIAGNOSIS — Z7982 Long term (current) use of aspirin: Secondary | ICD-10-CM | POA: Insufficient documentation

## 2022-03-06 DIAGNOSIS — W1809XA Striking against other object with subsequent fall, initial encounter: Secondary | ICD-10-CM | POA: Diagnosis not present

## 2022-03-06 DIAGNOSIS — Z20822 Contact with and (suspected) exposure to covid-19: Secondary | ICD-10-CM | POA: Diagnosis not present

## 2022-03-06 DIAGNOSIS — Z743 Need for continuous supervision: Secondary | ICD-10-CM | POA: Diagnosis not present

## 2022-03-06 DIAGNOSIS — J441 Chronic obstructive pulmonary disease with (acute) exacerbation: Secondary | ICD-10-CM | POA: Diagnosis not present

## 2022-03-06 DIAGNOSIS — J449 Chronic obstructive pulmonary disease, unspecified: Secondary | ICD-10-CM | POA: Diagnosis not present

## 2022-03-06 DIAGNOSIS — S41111A Laceration without foreign body of right upper arm, initial encounter: Secondary | ICD-10-CM | POA: Diagnosis not present

## 2022-03-06 DIAGNOSIS — W19XXXA Unspecified fall, initial encounter: Secondary | ICD-10-CM | POA: Diagnosis not present

## 2022-03-06 DIAGNOSIS — S0093XA Contusion of unspecified part of head, initial encounter: Secondary | ICD-10-CM | POA: Insufficient documentation

## 2022-03-06 DIAGNOSIS — N390 Urinary tract infection, site not specified: Secondary | ICD-10-CM | POA: Diagnosis not present

## 2022-03-06 DIAGNOSIS — R319 Hematuria, unspecified: Secondary | ICD-10-CM | POA: Diagnosis not present

## 2022-03-06 LAB — URINALYSIS, ROUTINE W REFLEX MICROSCOPIC
Bilirubin Urine: NEGATIVE
Glucose, UA: NEGATIVE mg/dL
Ketones, ur: NEGATIVE mg/dL
Nitrite: NEGATIVE
Protein, ur: 30 mg/dL — AB
Specific Gravity, Urine: 1.02 (ref 1.005–1.030)
pH: 6 (ref 5.0–8.0)

## 2022-03-06 LAB — COMPREHENSIVE METABOLIC PANEL
ALT: 14 U/L (ref 0–44)
AST: 21 U/L (ref 15–41)
Albumin: 3.8 g/dL (ref 3.5–5.0)
Alkaline Phosphatase: 63 U/L (ref 38–126)
Anion gap: 5 (ref 5–15)
BUN: 27 mg/dL — ABNORMAL HIGH (ref 8–23)
CO2: 28 mmol/L (ref 22–32)
Calcium: 8.7 mg/dL — ABNORMAL LOW (ref 8.9–10.3)
Chloride: 103 mmol/L (ref 98–111)
Creatinine, Ser: 1.1 mg/dL — ABNORMAL HIGH (ref 0.44–1.00)
GFR, Estimated: 53 mL/min — ABNORMAL LOW (ref >60)
Glucose, Bld: 179 mg/dL — ABNORMAL HIGH (ref 70–99)
Potassium: 4.6 mmol/L (ref 3.5–5.1)
Sodium: 136 mmol/L (ref 135–145)
Total Bilirubin: 0.5 mg/dL (ref 0.3–1.2)
Total Protein: 7.3 g/dL (ref 6.5–8.1)

## 2022-03-06 LAB — CBC WITH DIFFERENTIAL/PLATELET
Abs Immature Granulocytes: 0.09 10*3/uL — ABNORMAL HIGH (ref 0.00–0.07)
Basophils Absolute: 0 10*3/uL (ref 0.0–0.1)
Basophils Relative: 0 %
Eosinophils Absolute: 0 10*3/uL (ref 0.0–0.5)
Eosinophils Relative: 0 %
HCT: 38.7 % (ref 36.0–46.0)
Hemoglobin: 12.5 g/dL (ref 12.0–15.0)
Immature Granulocytes: 1 %
Lymphocytes Relative: 7 %
Lymphs Abs: 1.1 10*3/uL (ref 0.7–4.0)
MCH: 29.7 pg (ref 26.0–34.0)
MCHC: 32.3 g/dL (ref 30.0–36.0)
MCV: 91.9 fL (ref 80.0–100.0)
Monocytes Absolute: 1.2 10*3/uL — ABNORMAL HIGH (ref 0.1–1.0)
Monocytes Relative: 8 %
Neutro Abs: 13.4 10*3/uL — ABNORMAL HIGH (ref 1.7–7.7)
Neutrophils Relative %: 84 %
Platelets: 204 10*3/uL (ref 150–400)
RBC: 4.21 MIL/uL (ref 3.87–5.11)
RDW: 14.8 % (ref 11.5–15.5)
WBC: 15.8 10*3/uL — ABNORMAL HIGH (ref 4.0–10.5)
nRBC: 0 % (ref 0.0–0.2)

## 2022-03-06 LAB — RESP PANEL BY RT-PCR (FLU A&B, COVID) ARPGX2
Influenza A by PCR: NEGATIVE
Influenza B by PCR: NEGATIVE
SARS Coronavirus 2 by RT PCR: NEGATIVE

## 2022-03-06 MED ORDER — ALBUTEROL SULFATE (5 MG/ML) 0.5% IN NEBU: 5.0000 mg | INHALATION_SOLUTION | Freq: Four times a day (QID) | RESPIRATORY_TRACT | 2 refills | Status: DC | PRN

## 2022-03-06 MED ORDER — ALBUTEROL SULFATE (2.5 MG/3ML) 0.083% IN NEBU
5.0000 mg | INHALATION_SOLUTION | Freq: Once | RESPIRATORY_TRACT | Status: AC
Start: 2022-03-06 — End: 2022-03-06
  Administered 2022-03-06: 5 mg via RESPIRATORY_TRACT
  Filled 2022-03-06: qty 6

## 2022-03-06 MED ORDER — METHYLPREDNISOLONE SODIUM SUCC 125 MG IJ SOLR
125.0000 mg | INTRAMUSCULAR | Status: AC
  Administered 2022-03-06: 125 mg via INTRAVENOUS
  Filled 2022-03-06: qty 2

## 2022-03-06 MED ORDER — SODIUM CHLORIDE 0.9 % IV SOLN: INTRAVENOUS | Status: DC

## 2022-03-06 MED ORDER — CEFDINIR 300 MG PO CAPS
300.0000 mg | ORAL_CAPSULE | Freq: Two times a day (BID) | ORAL | Status: DC
  Administered 2022-03-06: 300 mg via ORAL
  Filled 2022-03-06: qty 1

## 2022-03-06 MED ORDER — IPRATROPIUM BROMIDE 0.02 % IN SOLN
0.5000 mg | Freq: Once | RESPIRATORY_TRACT | Status: AC
  Administered 2022-03-06: 0.5 mg via RESPIRATORY_TRACT
  Filled 2022-03-06: qty 2.5

## 2022-03-06 MED ORDER — CEFDINIR 300 MG PO CAPS: 300.0000 mg | ORAL_CAPSULE | Freq: Two times a day (BID) | ORAL | 0 refills | Status: DC

## 2022-03-06 MED ORDER — PREDNISONE 50 MG PO TABS: ORAL_TABLET | ORAL | 0 refills | Status: DC

## 2022-03-06 MED ORDER — ALBUTEROL SULFATE (2.5 MG/3ML) 0.083% IN NEBU
5.0000 mg | INHALATION_SOLUTION | Freq: Once | RESPIRATORY_TRACT | Status: AC
  Administered 2022-03-06: 5 mg via RESPIRATORY_TRACT
  Filled 2022-03-06: qty 6

## 2022-03-06 NOTE — Discharge Instructions (Addendum)
Use your inhaler or nebulizer every 4-6 hours for the next 2 days.  Return here for any trouble breathing ?

## 2022-03-06 NOTE — ED Provider Notes (Addendum)
?[facility] DEPT ?Provider Note ? ? ?CSN: [id number] ?Arrival date & time: 03/06/22  1950 ? ?  ? ?History ? ?Chief Complaint  ?Patient presents with  ? Cough  ? ? ?[name] is a 76 y.o. female. ? ?76 year old female with history of COPD presents with increased dyspnea with associated cough congestion.  Patient was found to have decreased activity today.  Concern for possible UTI as this is how she is felt in the past when she has had back.  No fever or chills.  No flank pain noted.  Of note also, patient did have mechanical fall today where she did strike the right side of her head.  She did not have any loss of consciousness.  She did not take blood thinners.  Has bruising to the right side of her head as well as had a skin tear to her right mid arm.  But denies any joint pain.  Patient has had nonproductive cough.  Does use inhalers at home.  When EMS was called, patient's pulse ox was reported to be in the 80s and she was placed on 4 L of O2.  She has been weaned down to 2 L by nasal cannula and she is satting in the low to mid 90s.  Patient does not use oxygen chronically ? ? ?  ? ?Home Medications ?Prior to Admission medications   ?Medication Sig Start Date End Date Taking? Authorizing Provider  ?albuterol (PROVENTIL) (2.5 MG/3ML) 0.083% nebulizer solution USE 1 VIAL IN NEBULIZER EVERY 4 HOURS AS NEEDED FOR WHEEZING AND FOR SHORTNESS OF BREATH ?Patient taking differently: Take 2.5 mg by nebulization every 4 (four) hours as needed for wheezing or shortness of breath. 02/12/21   McGowen, [name], MD  ?albuterol (VENTOLIN HFA) 108 (90 Base) MCG/ACT inhaler INHALE 2 PUFFS BY MOUTH EVERY 6 HOURS AS NEEDED FOR WHEEZING OR SHORTNESS OF BREATH ?Patient taking differently: 2 puffs every 6 (six) hours as needed for wheezing. 06/05/20   McGowen, [name], MD  ?aspirin 81 MG EC tablet Take 81 mg by mouth daily.     [provider]  ?atorvastatin (LIPITOR) 40 MG tablet Take 1  tablet (40 mg total) by mouth daily. 02/02/22   McGowen, [name], MD  ?buPROPion (WELLBUTRIN XL) 150 MG 24 hr tablet Take 1 tablet by mouth once daily 12/13/21   McGowen, [name], MD  ?Calcium Carbonate-Vit D-Min (CALTRATE 600+D PLUS MINERALS) 600-800 MG-UNIT TABS Take 1-2 tablets by mouth See admin instructions. Takes 1 tablet in the morning and 2 tablets at night    [provider]  ?cetirizine (ZYRTEC) 10 MG tablet Take 10 mg by mouth daily.    [provider]  ?escitalopram (LEXAPRO) 20 MG tablet Take 1 tablet (20 mg total) by mouth daily. 02/02/22   McGowen, [name], MD  ?fluticasone (FLOVENT HFA) 220 MCG/ACT inhaler Inhale 2 puffs into the lungs 2 (two) times daily. 10/28/20   McGowen, [name], MD  ?ibandronate (BONIVA) 150 MG tablet Take 1 tablet (150 mg total) by mouth every 30 (thirty) days. Take in the morning with a full glass of water, on an empty stomach, and do not take anything else by mouth or lie down for the next 30 min. 05/25/21   McGowen, [name], MD  ?insulin isophane & regular human KwikPen (HUMULIN 70/30 KWIKPEN) (70-30) 100 UNIT/ML KwikPen INJECT 4 UNITS SUBCUTANEOUSLY WITH BREAKFAST AND 2 UNITS WITH SUPPER 02/02/22   McGowen, [name], MD  ?Lancets (  ONETOUCH DELICA PLUS [id number]) MISC USE 1  TO CHECK GLUCOSE THREE TIMES DAILY 02/02/22   McGowen, [name], MD  ?metoprolol tartrate (LOPRESSOR) 25 MG tablet Take 1/2 (one-half) tablet by mouth twice daily 02/02/22   McGowen, [name], MD  ?Multiple Vitamins-Minerals (MULTIVITAMIN WOMEN PO) Take 1 tablet by mouth daily.    [provider]  ?Donald Siva test strip USE 1 STRIP TO CHECK GLUCOSE THREE TIMES DAILY 05/19/21   McGowen, [name], MD  ?pantoprazole (PROTONIX) 40 MG tablet Take 1 tablet (40 mg total) by mouth daily. 02/02/22   McGowen, [name], MD  ?   ? ?Allergies    ?Lactose intolerance (gi) and Ibuprofen   ? ?Review of Systems   ?Review of Systems  ?All other systems reviewed and are negative. ? ?Physical  Exam ?Updated Vital Signs ?BP (!) 117/48   Pulse (!) 107   Temp 98.1 ?F (36.7 ?C)   Resp (!) 26   Ht 1.575 m (5\' 2" )   Wt 65.1 kg   SpO2 98%   BMI 26.25 kg/m?  ?Physical Exam ?Vitals and nursing note reviewed.  ?Constitutional:   ?   General: She is not in acute distress. ?   Appearance: Normal appearance. She is well-developed. She is not toxic-appearing.  ?HENT:  ?   Head: Normocephalic and atraumatic.  ?Eyes:  ?   General: Lids are normal.  ?   Conjunctiva/sclera: Conjunctivae normal.  ?   Pupils: Pupils are equal, round, and reactive to light.  ?Neck:  ?   Thyroid: No thyroid mass.  ?   Trachea: No tracheal deviation.  ?Cardiovascular:  ?   Rate and Rhythm: Normal rate and regular rhythm.  ?   Heart sounds: Normal heart sounds. No murmur heard. ?  No gallop.  ?Pulmonary:  ?   Effort: Tachypnea present. No respiratory distress.  ?   Breath sounds: No stridor. Examination of the right-lower field reveals decreased breath sounds and wheezing. Examination of the left-lower field reveals decreased breath sounds and wheezing. Decreased breath sounds and wheezing present. No rhonchi or rales.  ?Abdominal:  ?   General: There is no distension.  ?   Palpations: Abdomen is soft.  ?   Tenderness: There is no abdominal tenderness. There is no rebound.  ?Musculoskeletal:     ?   General: No tenderness. Normal range of motion.  ?   Cervical back: Normal range of motion and neck supple.  ?Skin: ?   General: Skin is warm and dry.  ?   Findings: No abrasion or rash.  ?Neurological:  ?   Mental Status: She is alert and oriented to person, place, and time. Mental status is at baseline.  ?   GCS: GCS eye subscore is 4. GCS verbal subscore is 5. GCS motor subscore is 6.  ?   Cranial Nerves: No cranial nerve deficit.  ?   Sensory: No sensory deficit.  ?   Motor: Motor function is intact.  ?Psychiatric:     ?   Attention and Perception: Attention normal.     ?   Speech: Speech normal.     ?   Behavior: Behavior normal.  ? ? ?ED  Results / Procedures / Treatments   ?Labs ?(all labs ordered are listed, but only abnormal results are displayed) ?Labs Reviewed  ?RESP PANEL BY RT-PCR (FLU A&B, COVID) [id number]  ?URINALYSIS, ROUTINE W REFLEX MICROSCOPIC  ?CBC WITH DIFFERENTIAL/PLATELET  ?COMPREHENSIVE METABOLIC PANEL  ? ? ?EKG ?None ? ?  Radiology ?No results found. ? ?Procedures ?Procedures  ? ? ?Medications Ordered in ED ?Medications  ?0.9 %  sodium chloride infusion (has no administration in time range)  ?albuterol (PROVENTIL) (2.5 MG/3ML) 0.083% nebulizer solution 5 mg (has no administration in time range)  ?ipratropium (ATROVENT) nebulizer solution 0.5 mg (has no administration in time range)  ?methylPREDNISolone sodium succinate (SOLU-MEDROL) 125 mg/2 mL injection 125 mg (has no administration in time range)  ? ? ?ED Course/ Medical Decision Making/ A&P ?  ?                        ?Medical Decision Making ?Amount and/or Complexity of Data Reviewed ?Labs: ordered. ?Radiology: ordered. ?ECG/medicine tests: ordered. ? ?Risk ?Prescription drug management. ? ?Chest x-ray per my interpretation shows no acute findings consistent with CHF or pneumonia. ?Patient presented with wheezing and concern for possible COPD exacerbation.  Consider other diagnoses such as PE or ACS but feel less likely.  Started on albuterol along with Atrovent and Solu-Medrol.  Reexamined and lungs had improved.  Family was concerned about patient possibly have a UTI and urinalysis did show infection.  Patient overall is doing much better at this time.  Shared decision-making was done with patient and her daughter about admission versus going home.  They have opted to go home at this time.  Did receive her first dose of antibiotics for her UTI here.  We will send patient home on prednisone.  Patient has a home nebulizer and was given instructions how to use this.  Was instructed follow-up with her doctor next week ? ?CRITICAL CARE ?Performed by: Leota Jacobsen ?Total critical  care time: 50 minutes ?Critical care time was exclusive of separately billable procedures and treating other patients. ?Critical care was necessary to treat or prevent imminent or life-threatening deterioration. ?Cr

## 2022-03-06 NOTE — ED Triage Notes (Signed)
BIB [facility] with c/o increasing cough and congestion, worse than her chronic cough. Also c/o generalized fatigue resulting in a mechanical fall this AM in bathroom. No LOC, no blood thinners. Skin tears to R arm.  ? ?EMS reports sats of 90% on RA, 97% on 1L on arrival. ?

## 2022-03-07 ENCOUNTER — Other Ambulatory Visit: Payer: Self-pay | Admitting: Family Medicine

## 2022-03-15 ENCOUNTER — Telehealth (HOSPITAL_COMMUNITY): Payer: Self-pay | Admitting: Emergency Medicine

## 2022-03-15 MED ORDER — ALBUTEROL SULFATE (2.5 MG/3ML) 0.083% IN NEBU: 5.0000 mg | INHALATION_SOLUTION | Freq: Four times a day (QID) | RESPIRATORY_TRACT | 12 refills | Status: DC | PRN

## 2022-03-15 NOTE — Telephone Encounter (Signed)
Patient called because pharmacy does not have the albuterol that was prescribed.  We will attempt to prescribe albuterol that is held in that pharmacy.

## 2022-03-31 ENCOUNTER — Encounter (INDEPENDENT_AMBULATORY_CARE_PROVIDER_SITE_OTHER): Payer: Medicare Other | Admitting: Ophthalmology

## 2022-03-31 DIAGNOSIS — H43813 Vitreous degeneration, bilateral: Secondary | ICD-10-CM

## 2022-03-31 DIAGNOSIS — H35372 Puckering of macula, left eye: Secondary | ICD-10-CM

## 2022-03-31 DIAGNOSIS — H35033 Hypertensive retinopathy, bilateral: Secondary | ICD-10-CM

## 2022-03-31 DIAGNOSIS — E113512 Type 2 diabetes mellitus with proliferative diabetic retinopathy with macular edema, left eye: Secondary | ICD-10-CM

## 2022-03-31 DIAGNOSIS — I10 Essential (primary) hypertension: Secondary | ICD-10-CM

## 2022-03-31 DIAGNOSIS — E113591 Type 2 diabetes mellitus with proliferative diabetic retinopathy without macular edema, right eye: Secondary | ICD-10-CM | POA: Diagnosis not present

## 2022-03-31 IMAGING — CT CT HEAD WO/W CM
5 of 8 series · 17 of 47 positions shown, 18 images · IV contrast (omnipaque)
Comparison: 10/07/2019

CLINICAL DATA: Memory loss and word-finding difficulty

EXAM:
CT HEAD WITHOUT AND WITH CONTRAST
TECHNIQUE: Contiguous axial images were obtained from the base of the skull
through the vertex without and with intravenous contrast
CONTRAST:  80mL OMNIPAQUE IOHEXOL 350 MG/ML SOLN

[Series 2: head with · axial · 0.42mm/px · z∈[+1304,+1358]mm · 2 of 34 slices shown, 3 images]
[im 12/34  brain]
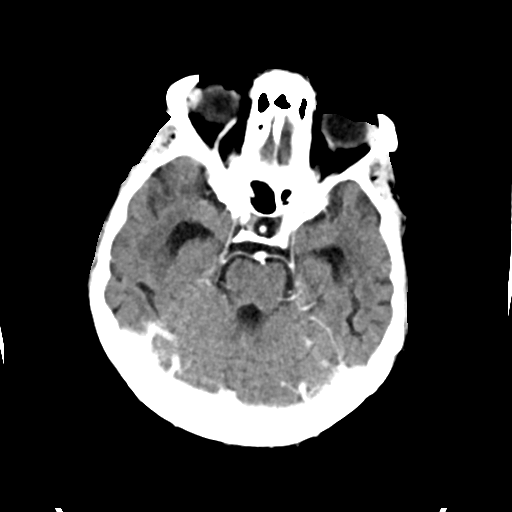
[im 12/34  bone]
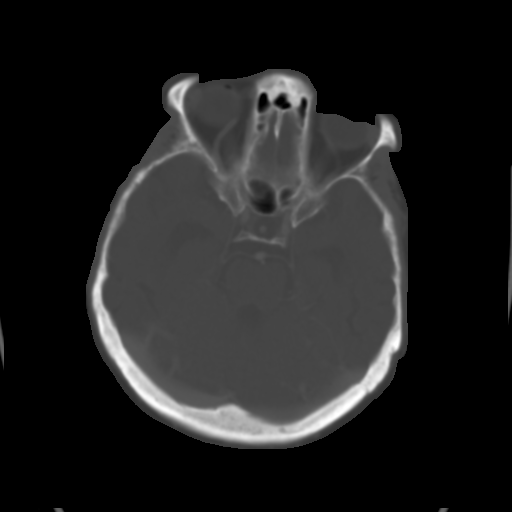
[im 23/34  brain]
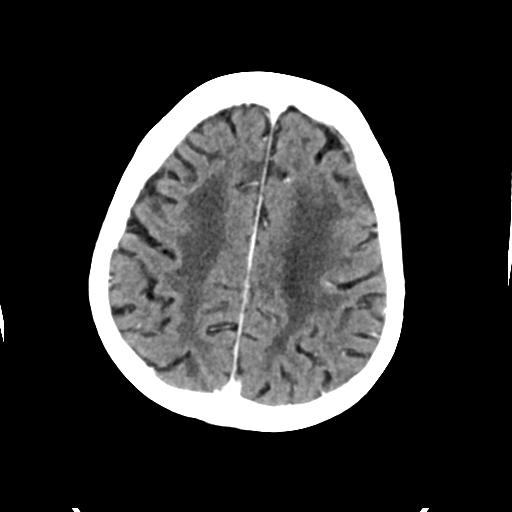

[Series 3: head bone · axial · 0.42mm/px · z∈[+1264,+1394]mm · 8 of 83 slices shown]
[im 9/83  bone]
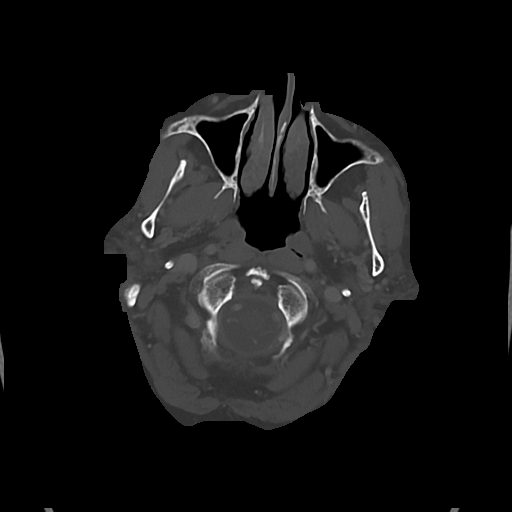
[im 17/83  bone]
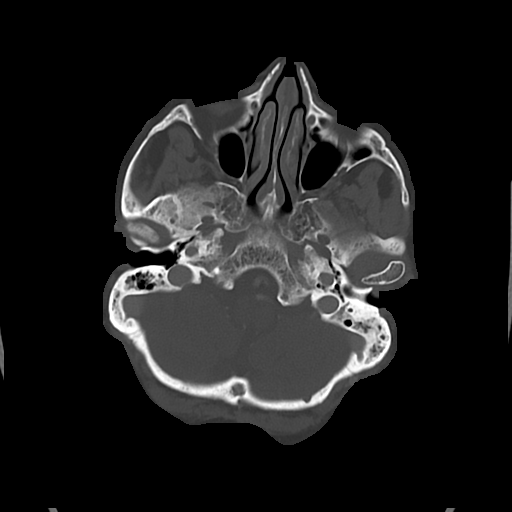
[im 25/83  bone]
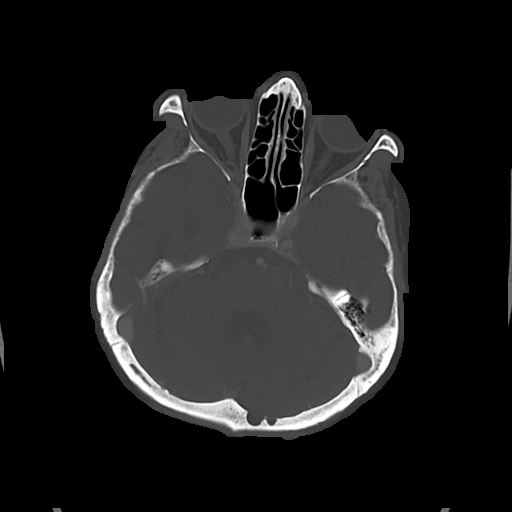
[im 33/83  bone]
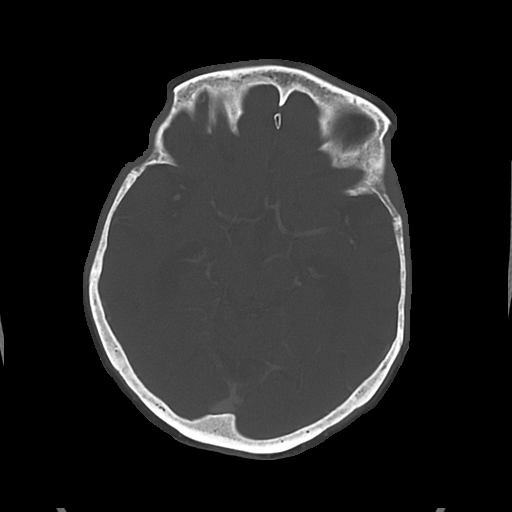
[im 50/83  bone]
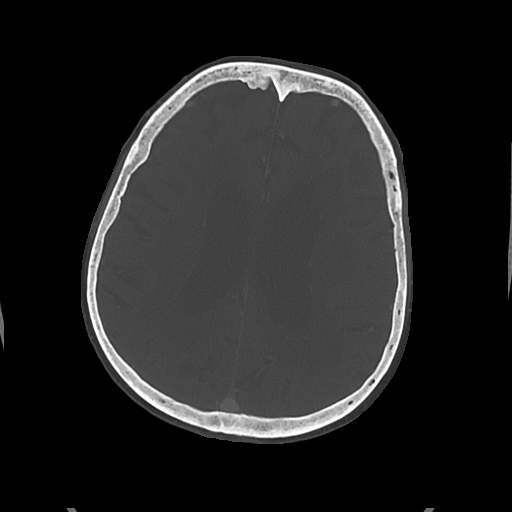
[im 58/83  bone]
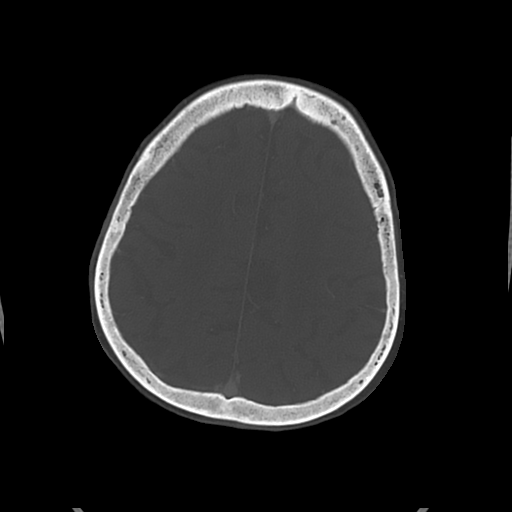
[im 66/83  bone]
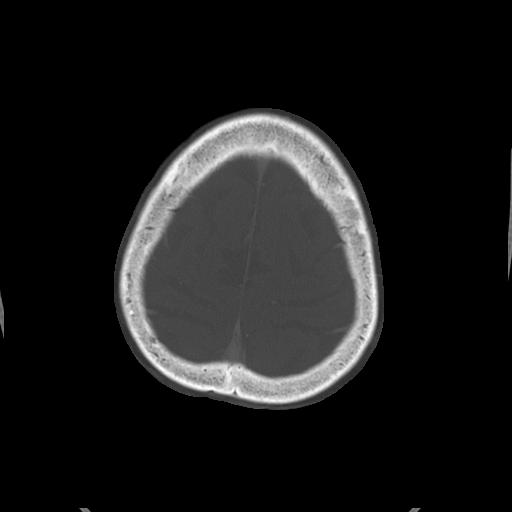
[im 74/83  bone]
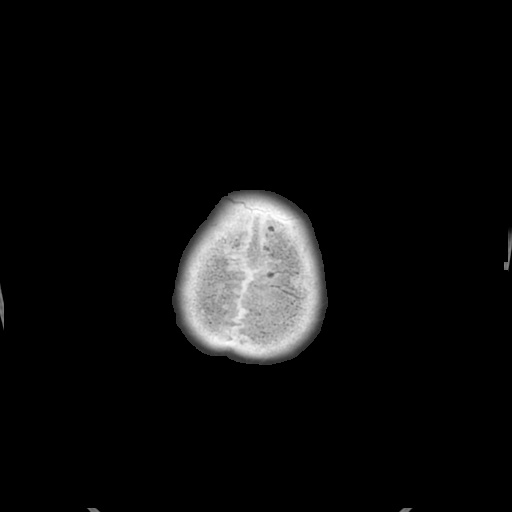

[Series 4: cor soft · coronal · 0.30mm/px · 3 of 60 slices shown]
[im 15/60  brain]
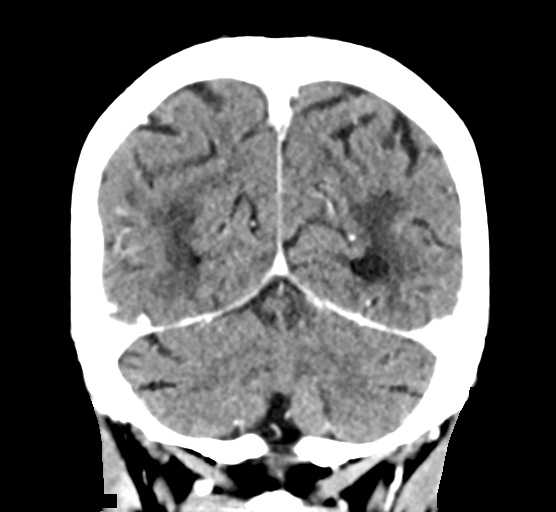
[im 30/60  brain]
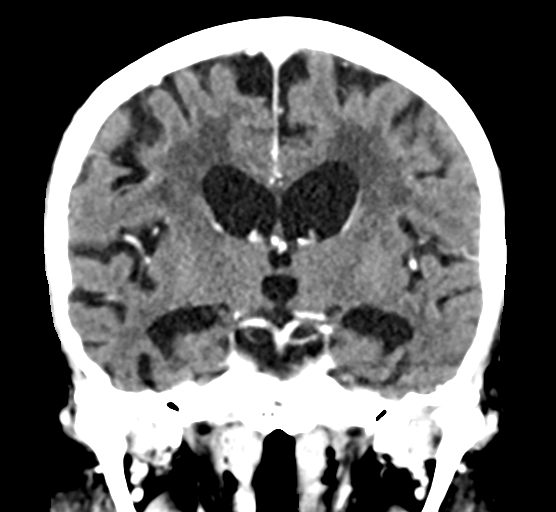
[im 45/60  brain]
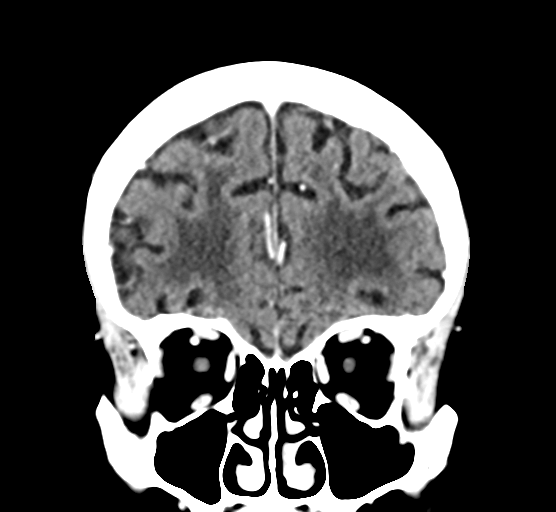

[Series 6: head wo · axial · 0.42mm/px · z∈[+1304,+1358]mm · 2 of 34 slices shown]
[im 12/34  brain]
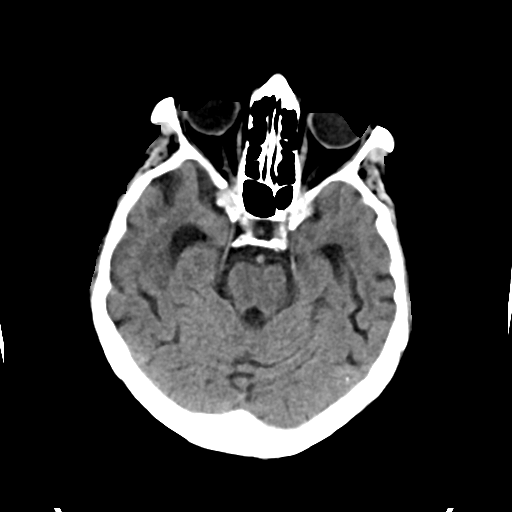
[im 23/34  brain]
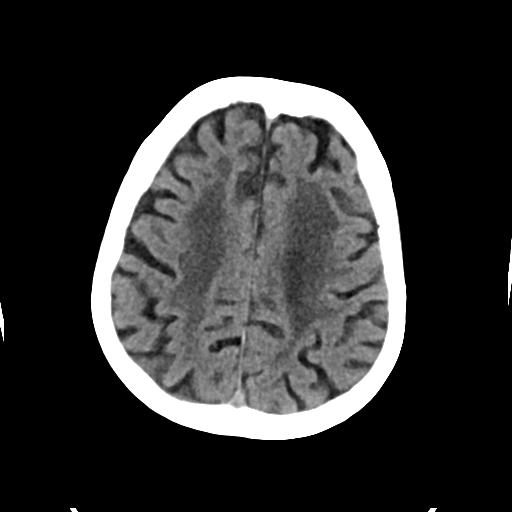

[Series 9: sag soft · sagittal · 0.31mm/px · 2 of 57 slices shown]
[im 19/57  brain]
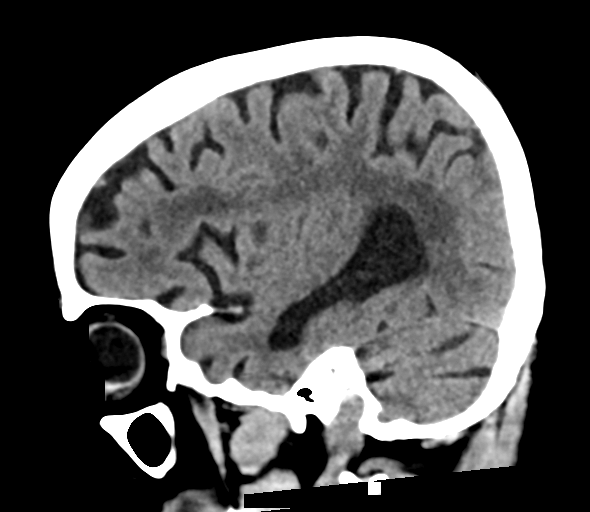
[im 38/57  brain]
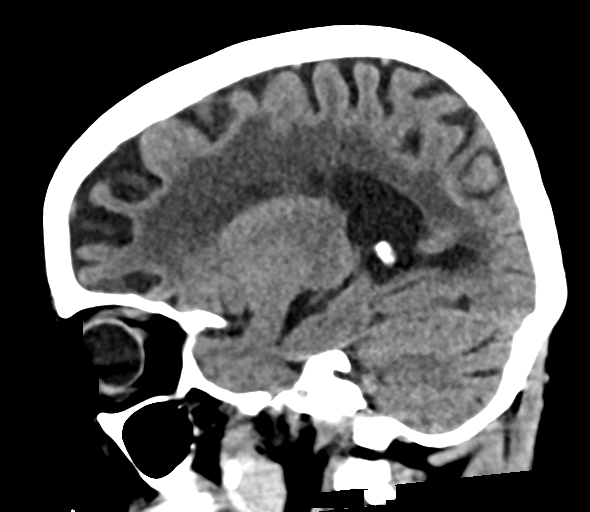

[17 of 47 positions shown; findings below may reference images not displayed]

FINDINGS: Brain: There is no mass, hemorrhage or extra-axial collection. There
is generalized atrophy without lobar predilection. Hypodensity of
the white matter is most commonly associated with chronic
microvascular disease. No abnormal enhancement

Vascular: No abnormal hyperdensity of the major intracranial
arteries or dural venous sinuses. No intracranial atherosclerosis.

Skull: The visualized skull base, calvarium and extracranial soft
tissues are normal.

Sinuses/Orbits: No fluid levels or advanced mucosal thickening of
the visualized paranasal sinuses. No mastoid or middle ear effusion.
The orbits are normal.
IMPRESSION: Generalized atrophy and chronic microvascular ischemia without acute
intracranial abnormality.

## 2022-03-31 IMAGING — CR DG CHEST 1V PORT
1 series · 1 of 1 positions shown · non-contrast
Comparison: October 07, 2019

CLINICAL DATA: Weakness

EXAM:
PORTABLE CHEST 1 VIEW

[chest ap]
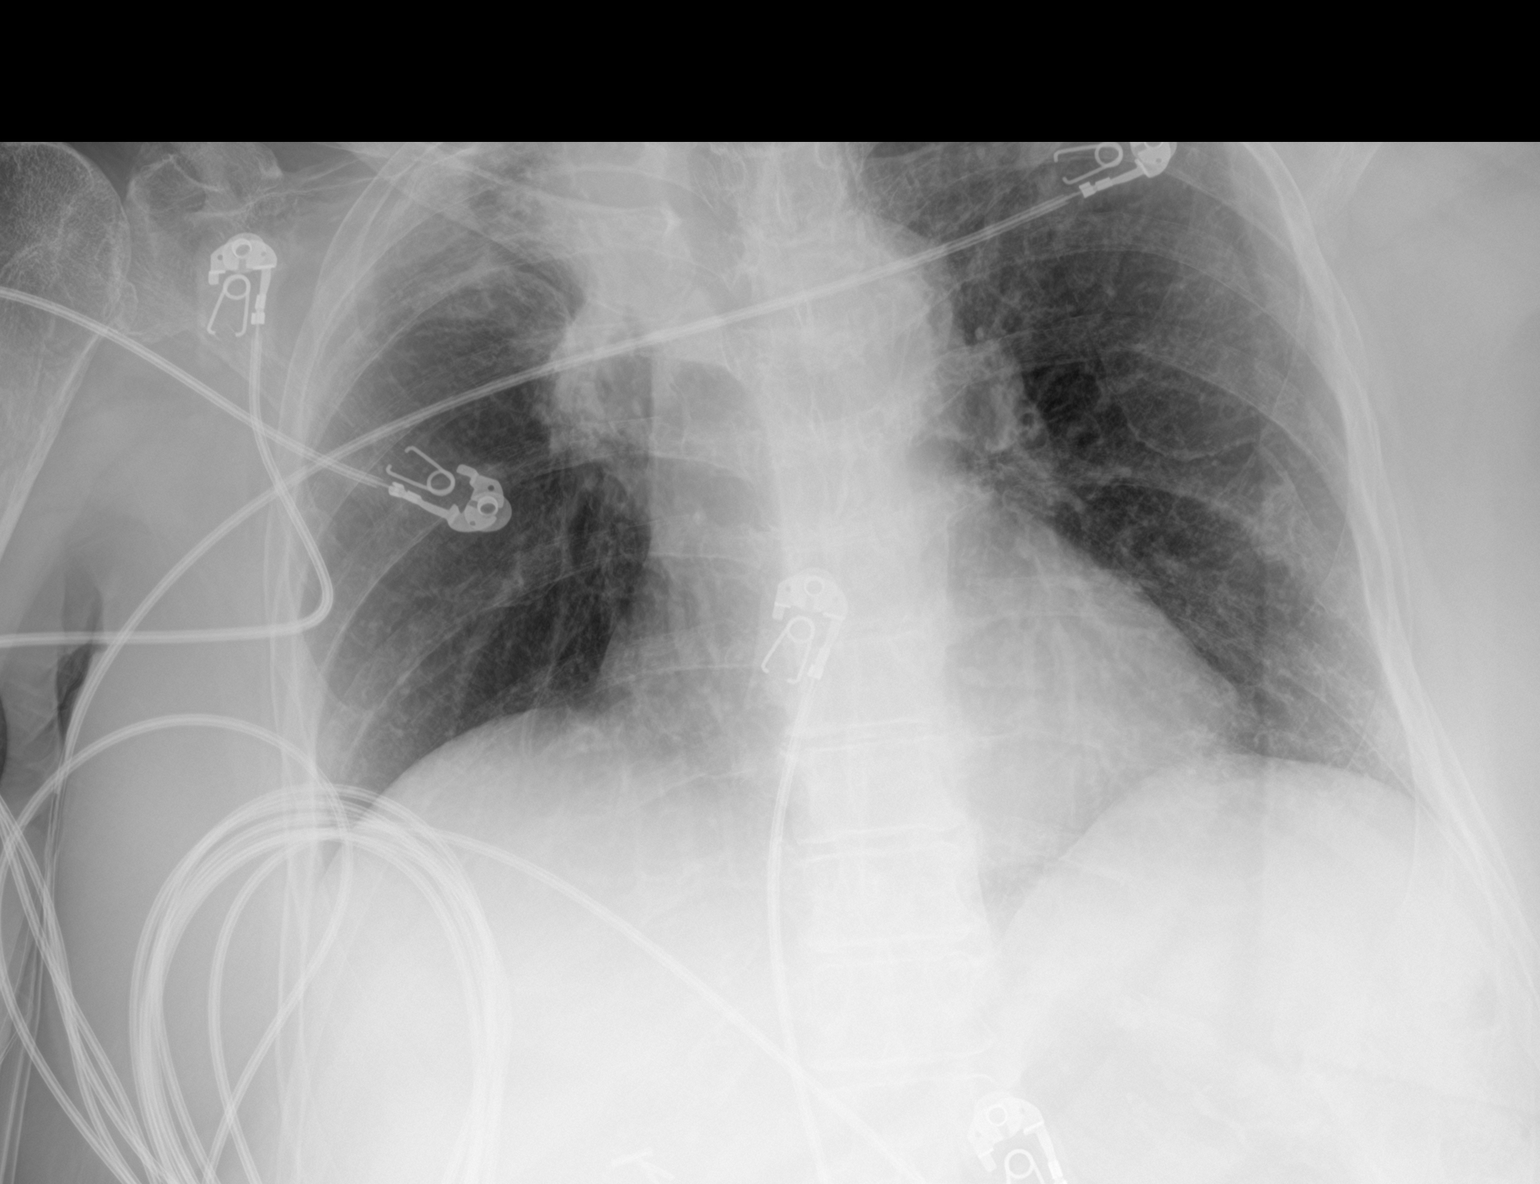

[1 of 1 positions shown; findings below may reference images not displayed]

FINDINGS: The heart size and mediastinal contours are within normal limits.
Aortic atherosclerosis. Similar appearance of the soft tissue
density in the right paramedian upper lobe and elevation of the
right hemidiaphragm, likely post radiation change. No new focal
consolidation. No pleural effusion or pneumothorax visualized. The
visualized skeletal structures are unchanged.
IMPRESSION: No evidence of acute cardiopulmonary disease.

Aortic Atherosclerosis ([id number].M).

## 2022-03-31 IMAGING — CR DG HIP (WITH OR WITHOUT PELVIS) 2-3V*L*
3 series · 3 of 3 positions shown · non-contrast
Comparison: Radiograph October 28, 2020 and February 03, 2020

CLINICAL DATA: weakness

EXAM:
DG HIP (WITH OR WITHOUT PELVIS) 2-3V LEFT

[pelvis ap]
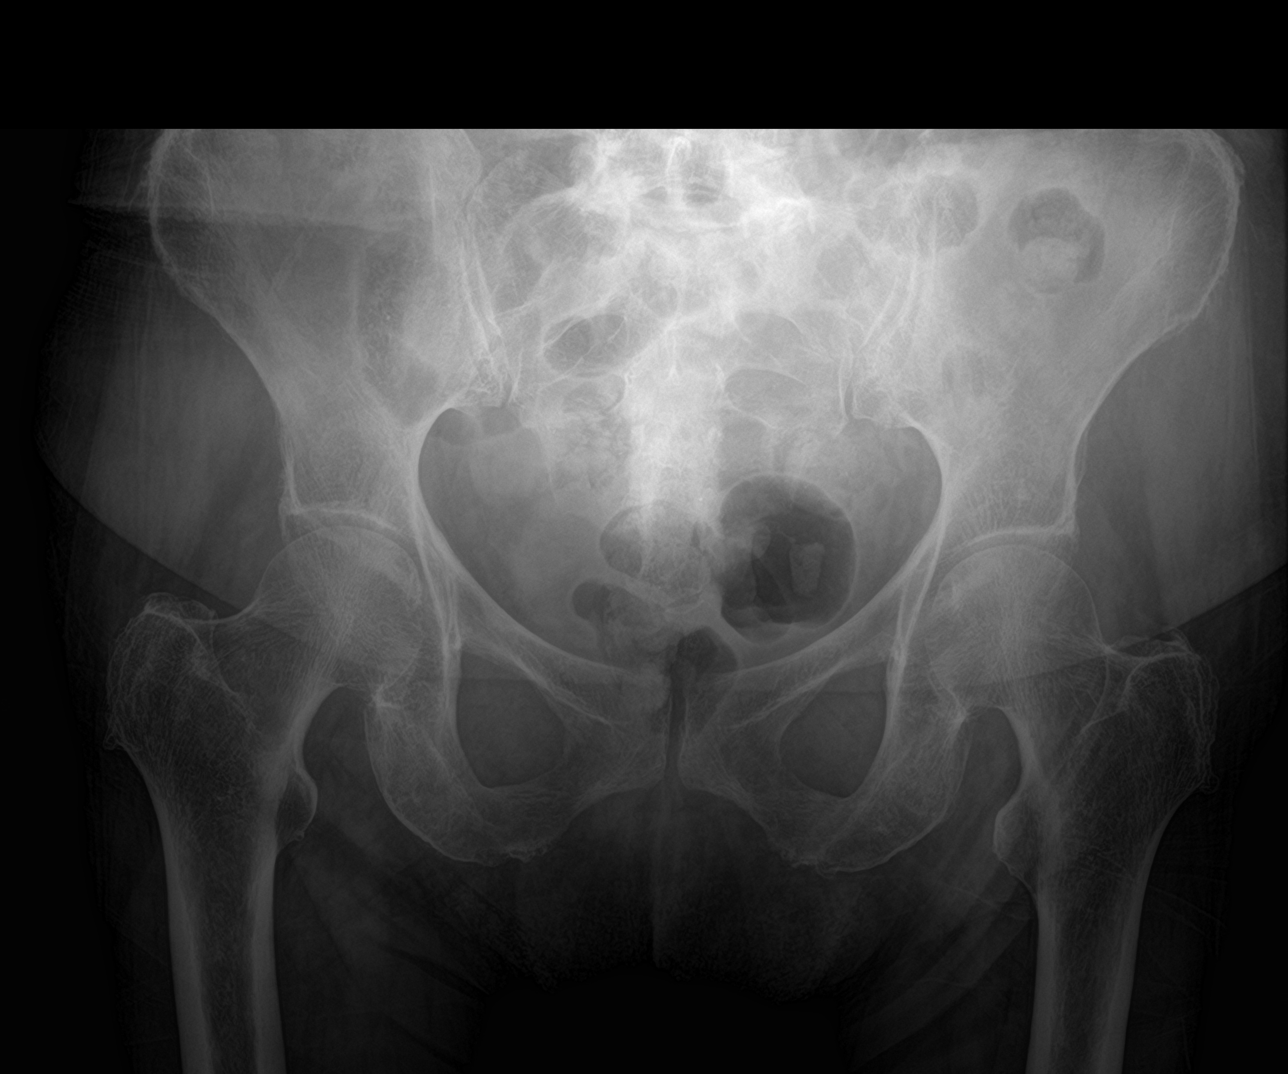

[hip ap]
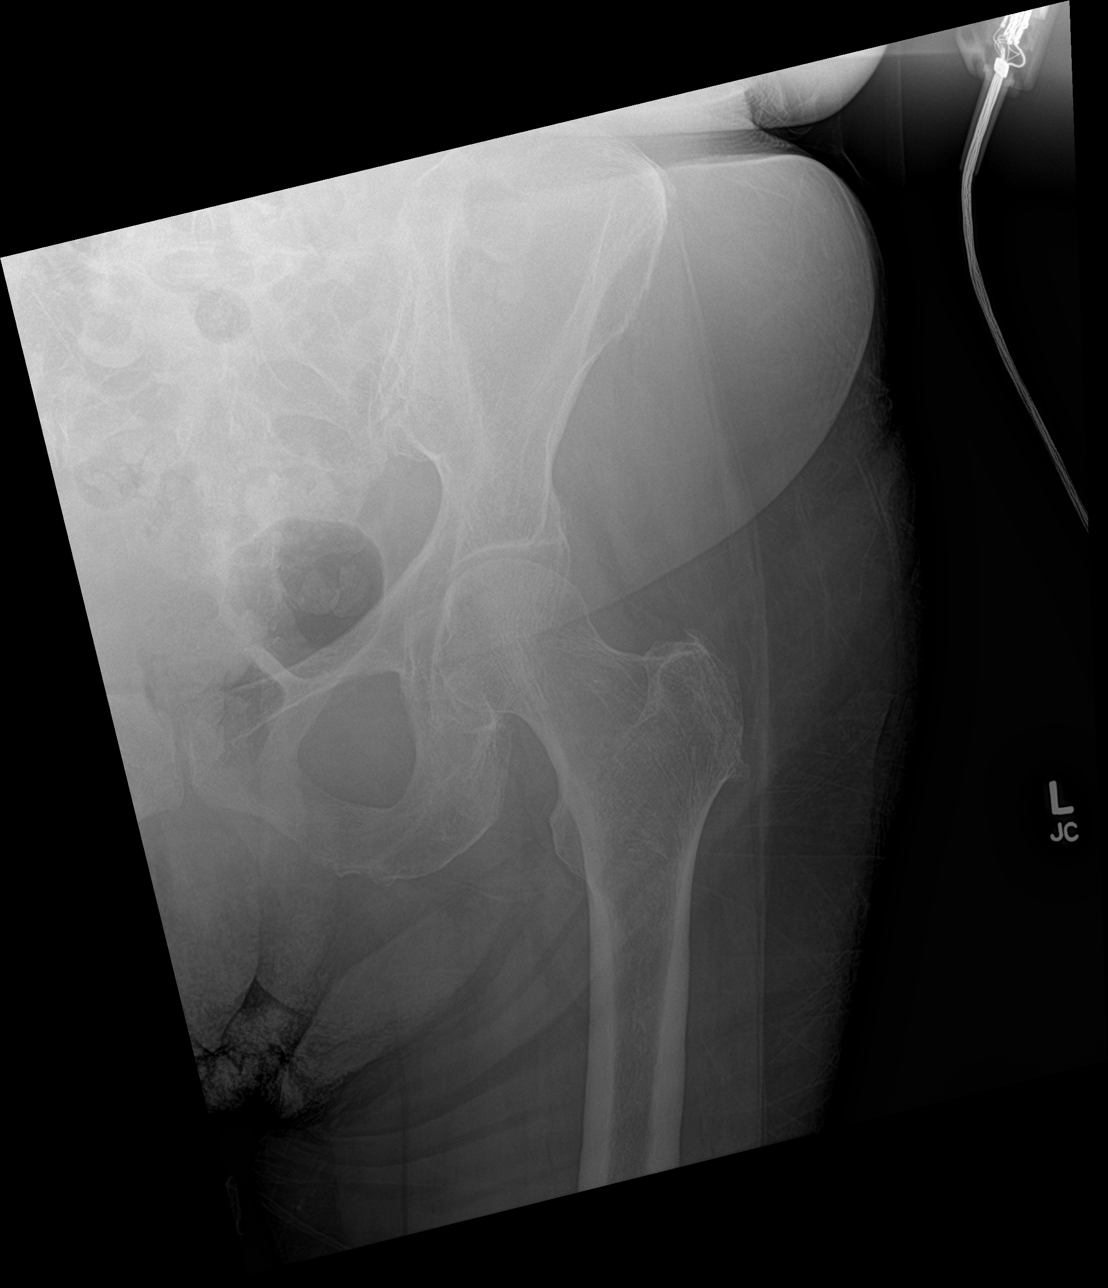

[hip lat]
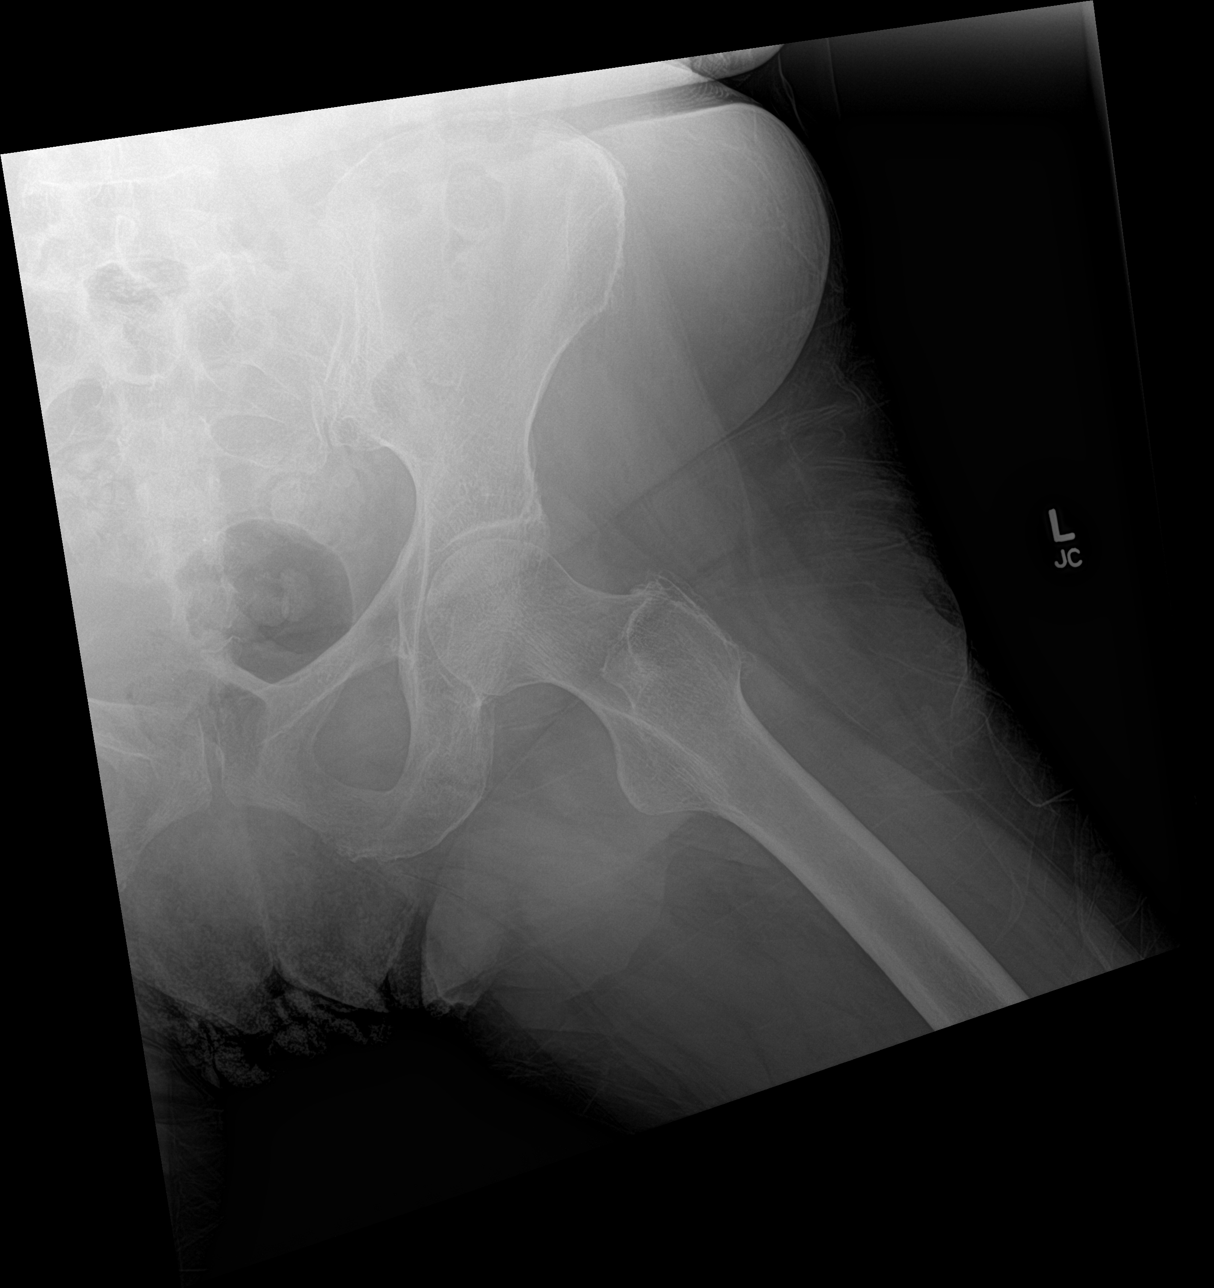

[3 of 3 positions shown; findings below may reference images not displayed]

FINDINGS: There is no evidence of hip fracture or dislocation. There is no
evidence of arthropathy or other focal bone abnormality.
IMPRESSION: Negative.

## 2022-03-31 IMAGING — CR DG FEMUR 2+V*R*
4 series · 4 of 4 positions shown · non-contrast
Comparison: Right hip radiograph October 08, 2019

CLINICAL DATA: weakness

EXAM:
RIGHT FEMUR 2 VIEWS

[femur ap (1 of 2)]
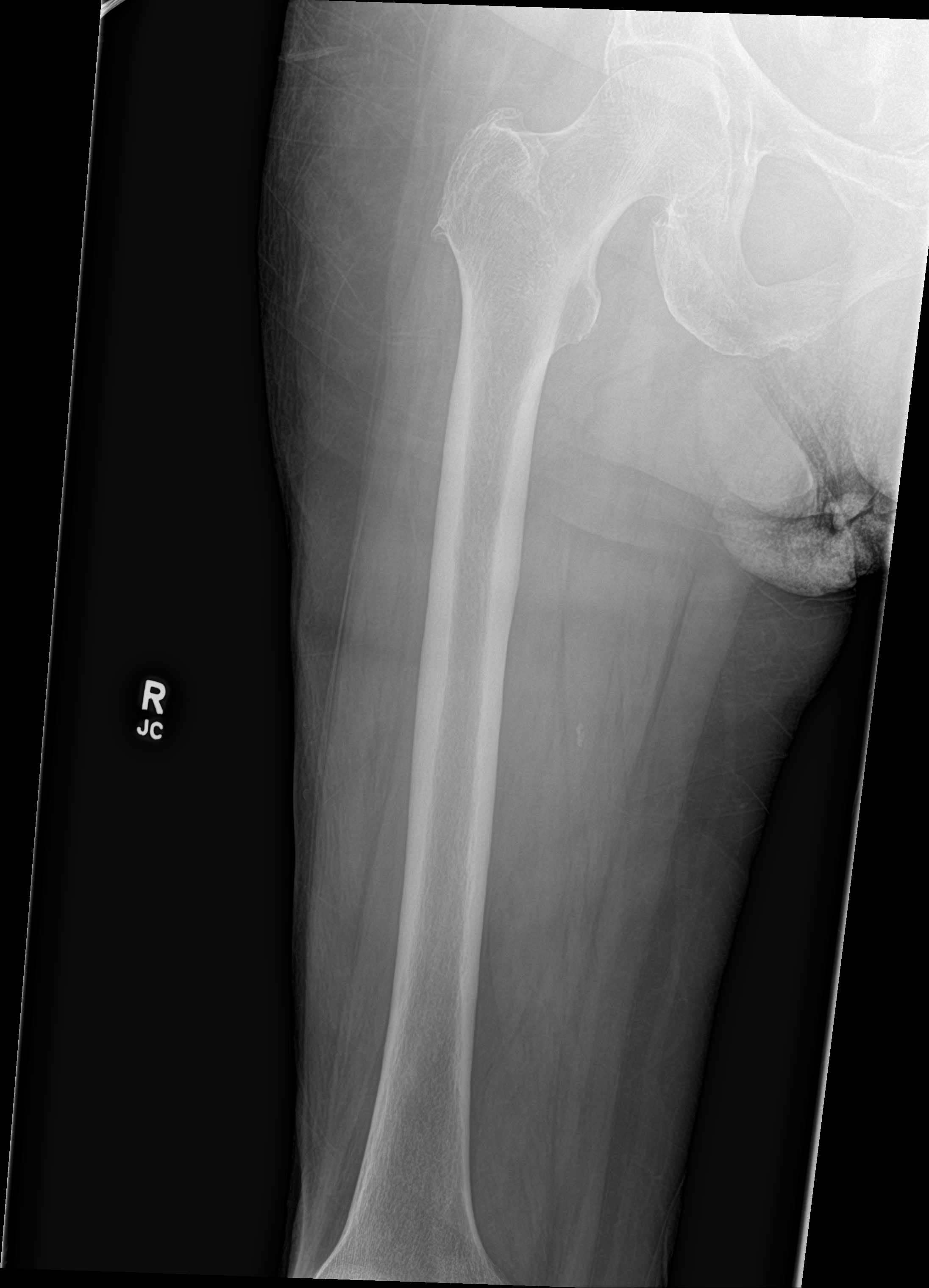

[femur ap (2 of 2)]
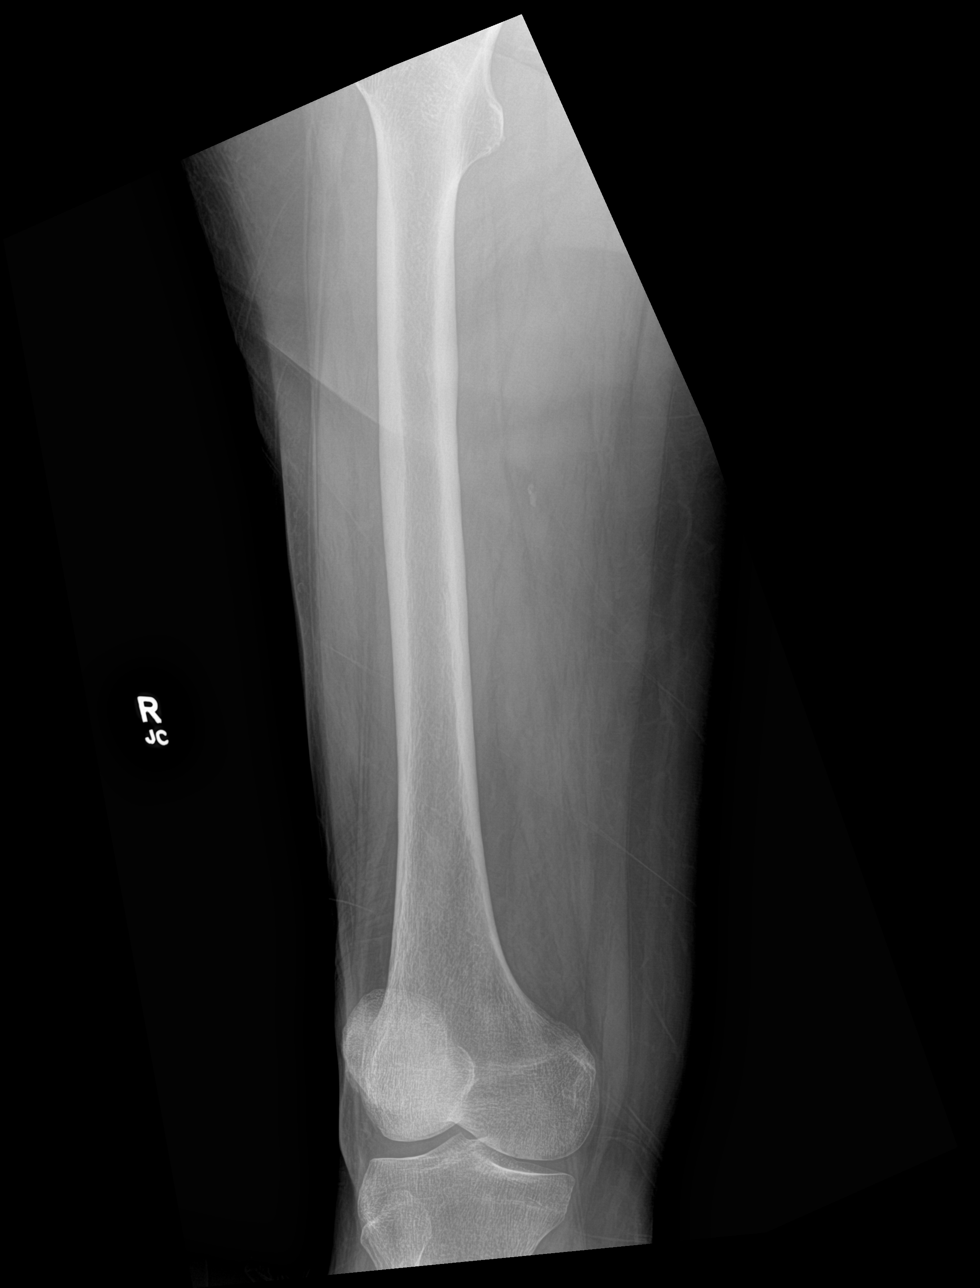

[femur lat (1 of 2)]
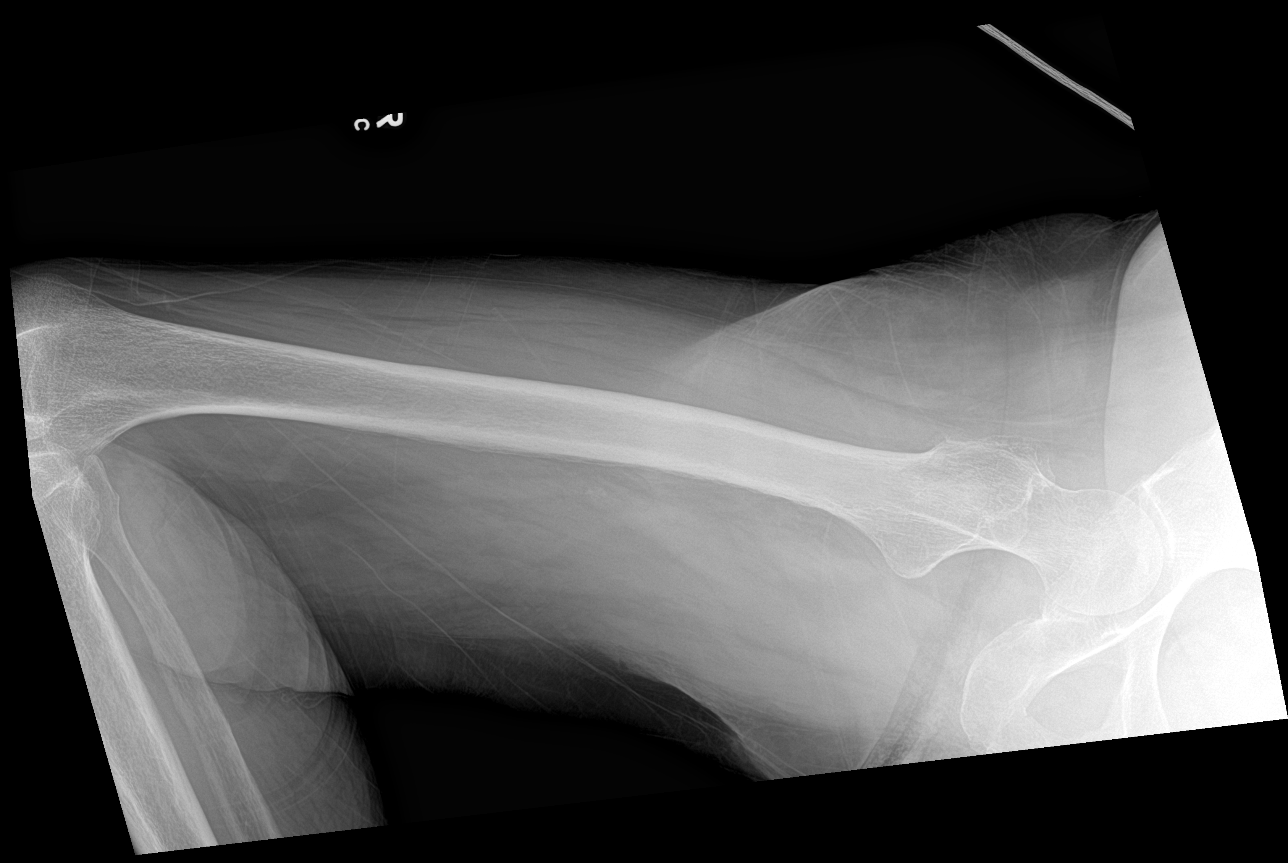

[femur lat (2 of 2)]
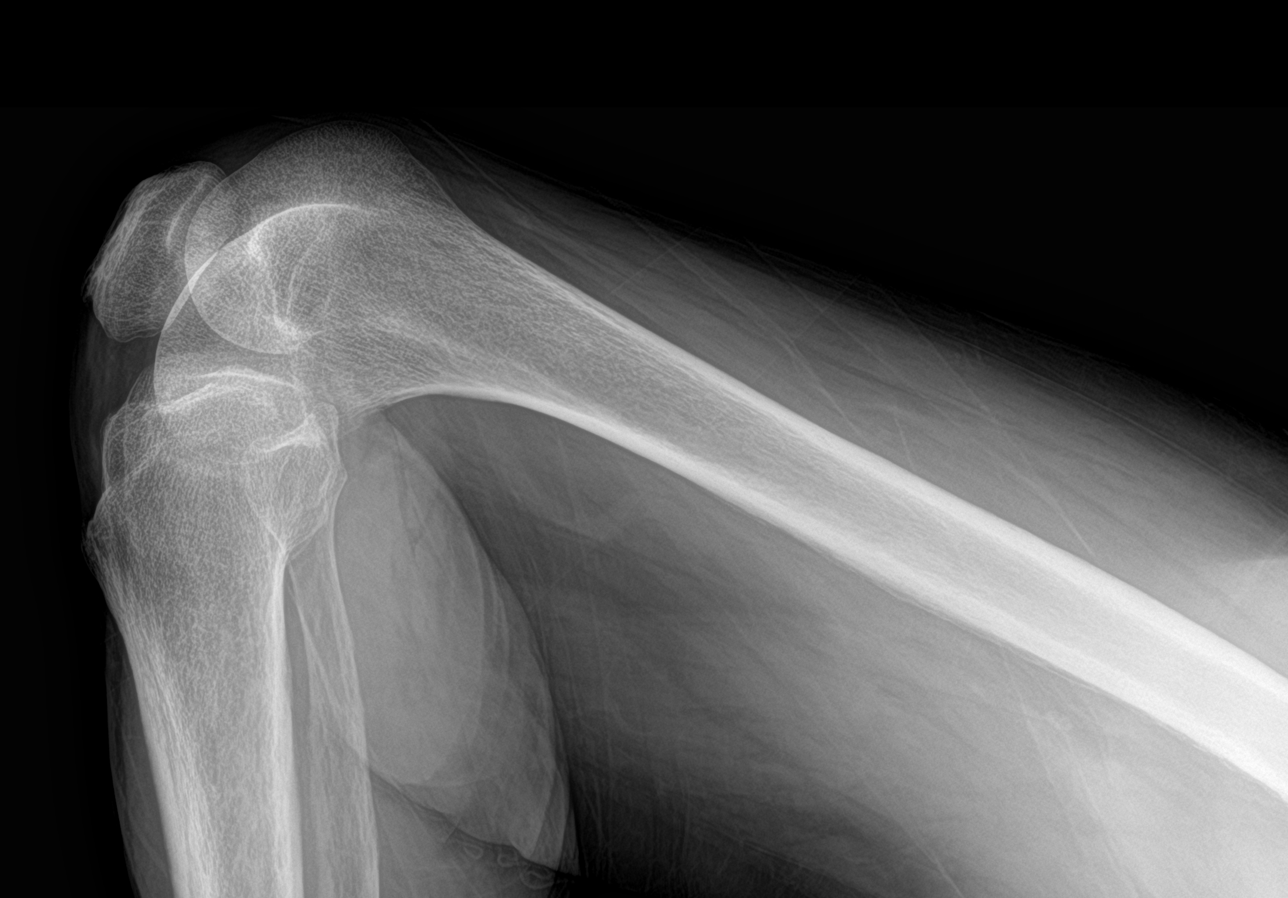

[4 of 4 positions shown; findings below may reference images not displayed]

FINDINGS: There is no evidence of fracture or other focal bone lesions. Soft
tissues are unremarkable.
IMPRESSION: No acute osseous abnormality.

## 2022-04-01 LAB — HM DIABETES EYE EXAM

## 2022-04-03 ENCOUNTER — Other Ambulatory Visit: Payer: Self-pay | Admitting: Family Medicine

## 2022-04-19 ENCOUNTER — Other Ambulatory Visit: Payer: Self-pay | Admitting: *Deleted

## 2022-04-21 ENCOUNTER — Other Ambulatory Visit: Payer: Self-pay | Admitting: *Deleted

## 2022-04-21 NOTE — Patient Outreach (Signed)
[facility]) Care Management  04/21/2022  [name] 1946-08-16 [id number]   Outreach attempt #2, unsuccessful to daughter, HIPAA compliant voice message left.  Will follow up within the next 3 business days.  [name], RN, MSN, [place] Manager [phone]

## 2022-04-25 ENCOUNTER — Other Ambulatory Visit: Payer: Self-pay | Admitting: *Deleted

## 2022-04-25 ENCOUNTER — Ambulatory Visit: Payer: Medicare Other | Admitting: *Deleted

## 2022-04-25 NOTE — Patient Outreach (Signed)
[facility]) Care Management  04/25/2022  Everlynn Sagun November 15, 1945 [id number]   Outreach attempt #3, unsuccessful.  No response from member after multiple unsuccessful outreach attempts and letter sent.  Will close case at this time due to inability to maintain contact.  Will notify member and primary MD of case closure.  [name], RN, MSN, [place] Manager [phone]

## 2022-05-04 ENCOUNTER — Ambulatory Visit (INDEPENDENT_AMBULATORY_CARE_PROVIDER_SITE_OTHER): Payer: Medicare Other | Admitting: Family Medicine

## 2022-05-04 ENCOUNTER — Encounter: Payer: Self-pay | Admitting: Family Medicine

## 2022-05-04 VITALS — BP 104/52 | HR 79 | Temp 98.0°F | Ht 62.0 in | Wt 142.2 lb

## 2022-05-04 DIAGNOSIS — Z0000 Encounter for general adult medical examination without abnormal findings: Secondary | ICD-10-CM

## 2022-05-04 DIAGNOSIS — N1831 Chronic kidney disease, stage 3a: Secondary | ICD-10-CM | POA: Diagnosis not present

## 2022-05-04 DIAGNOSIS — Z794 Long term (current) use of insulin: Secondary | ICD-10-CM

## 2022-05-04 DIAGNOSIS — E11319 Type 2 diabetes mellitus with unspecified diabetic retinopathy without macular edema: Secondary | ICD-10-CM

## 2022-05-04 DIAGNOSIS — E119 Type 2 diabetes mellitus without complications: Secondary | ICD-10-CM

## 2022-05-04 DIAGNOSIS — I10 Essential (primary) hypertension: Secondary | ICD-10-CM | POA: Diagnosis not present

## 2022-05-04 LAB — POCT GLYCOSYLATED HEMOGLOBIN (HGB A1C)
HbA1c POC (<> result, manual entry): 6.4 % (ref 4.0–5.6)
HbA1c, POC (controlled diabetic range): 6.4 % (ref 0.0–7.0)
HbA1c, POC (prediabetic range): 6.4 % (ref 5.7–6.4)
Hemoglobin A1C: 6.4 % — AB (ref 4.0–5.6)

## 2022-05-04 LAB — BASIC METABOLIC PANEL
BUN: 28 mg/dL — ABNORMAL HIGH (ref 6–23)
CO2: 30 mEq/L (ref 19–32)
Calcium: 9.4 mg/dL (ref 8.4–10.5)
Chloride: 99 mEq/L (ref 96–112)
Creatinine, Ser: 1.19 mg/dL (ref 0.40–1.20)
GFR: 45.25 mL/min — ABNORMAL LOW (ref >60.00)
Glucose, Bld: 146 mg/dL — ABNORMAL HIGH (ref 70–99)
Potassium: 4.8 mEq/L (ref 3.5–5.1)
Sodium: 134 mEq/L — ABNORMAL LOW (ref 135–145)

## 2022-05-04 MED ORDER — ONETOUCH ULTRA VI STRP: ORAL_STRIP | 11 refills | Status: DC

## 2022-05-04 NOTE — Progress Notes (Signed)
Office Note 05/04/2022  CC:  Chief Complaint  Patient presents with   Annual Exam    Pt is fasting    HPI:  Patient is a 76 y.o. who is here accompanied by her daughter [name] for annual health maintenance exam and 65-monthfollow-up diabetes, chronic renal insufficiency, and hypertension. A/P as of last visit: "#1 diabetes type 2. Doing well on 4 units of 70/30 insulin twice a day. Hemoglobin A1c, urine microalbumin/creatinine, and fasting glucose today.  2.  Hypertension.  Well-controlled on Lopressor 12.5 mg twice daily. Electrolytes and creatinine today.  3.  Hyperlipidemia.  Atorvastatin 40 mg a day. LDL goal less than 70. Lipid panel today.  4.  Chronic renal insufficiency stage III. Avoids NSAIDs, tries to pay attention to hydration. Electrolytes and creatinine today.   #5 COPD. Doing well on Flovent 220, 2 puffs twice daily. Albuterol every 6 hours as needed."  INTERIM HX: SElloisefeels well.  2 months ago she went to the emergency department and was treated for COPD exacerbation and UTI. CBC normal except white blood cell count 15.8 K.  Electrolytes normal.  BUN 27, creatinine 1.1. She feels like she has completely recovered from this.  Home glucoses reviewed today: Typically 10 5-130 in the morning and typically 130-160 in the evening. Occasional excursions up around 200.  No hypoglycemia.  Home blood pressures are typically in low normal range.  She occasionally has some dizziness and low blood pressure and when this happens they skip her evening dose of Lopressor.  Past Medical History:  Diagnosis Date   Acute renal failure (HCC)    Age-related nuclear cataract of both eyes 2016   +cortical age related cataracts OU    Anxiety and depression    Bilateral diabetic retinopathy ([facility] 2015   Dr. 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   Colonic diverticular abscess    COPD (chronic obstructive pulmonary disease) (HCC)    CVA (cerebral vascular accident) ([facility] 09/2016   MRI did show a left-sided small ischemic stroke which was acute but likely incidental (MRI was done b/c pt had TIA sx's in hospital).  Neuro put her on plavix at that time.  Cardiology d/c'd her plavix 08/2017.   Diabetes mellitus with complication (HCC)    diab retinopathy OU (laser)   Diverticulitis of large intestine with perforation and abscess 09/11/2016   Finger fracture, right 04/2020   R 5th metacarpal fx at the base (Dr. LMardelle Matte   GERD (gastroesophageal reflux disease)    protonix   History of concussion 08/25/2018   w/out loss of consciousness--mild increase in baseline memory impairment after.  CT head neg acute.   History of diverticulitis of colon    with abscess; required IR percutaneous drain placed 09/2016.   History of iron deficiency anemia    started iron 07/02/19.  EGD/colonoscopy 08/27/19 unrevealing. Capsule endoscopy considered but never done.   Hyperlipidemia    Hypertension    Left bundle branch block (LBBB) 12/16/2016   Lung cancer ([facility]    non-small cell lung ca, stage III in 05/2011; systemic chemotherapy concurrent with radiation followed by prophylactic cranial irradiation and has been observation since July of 2010 with no evidence for disease recurrence-released from onc f/u 12/2014 (needs annual cxr by PCP).  CXR 08/2015 stable.  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 Earlie Server did bx and this was NEG for malignancy.   Mild cognitive impairment with memory loss    Likely from brain radiation therapy   Non-obstructive CAD (coronary artery disease)    a. Cath 2006 preserved LV fxn, scattered irregularities without critical stenosis; b. 2008 stress echo negative for ischemia, but with hypertensive response; c. 08/2016 Cath: D1 25%, otw nl.   Normocytic anemia 2018    Iron, vit B12, folate all normal 12/2016.   Open-angle glaucoma 2016   Dr. Shirley Muscat, (bilateral)---responding to topical therapy   Osteoarthritis of both hands    Osteoporosis 03/2016; 03/2018   03/2016 DEXA T-score -2.2.  03/2018 T-score L femoral neck  -2.7--boniva started. DEXA 04/2021 T scor -2.6->boniva continued. Plan rpt DEXA 2 yrs.   Pneumonia    hospitalization 11/2016; hypoxemic resp failure--d/c'd home with home oxygen therapy   Pulmonary nodule, left 08/2021   LUL 11 mm ground glass, needs f/u CT 08/2022.   Seasonal allergic rhinitis    Subclinical hyperthyroidism 2018   Takotsubo cardiomyopathy    a. 08/2016 Echo: EF 30-35%, gr1DD, PASP 4mHg;  b. 08/2016 Cath: nonobs Dzs;  c. 11/2016 Echo: EF 40-45%, no rwma, nl RV fxn, mild TR, PASP 34mg.  ECHO 02/2017 EF 50-55%, grd II DD---essentially resolved Takotsubo 02/2017.   [place] de pointes ([facility]   a. 08/2016 in setting of diverticulitis & pneumoperitoneum and Takotsubo CM -->prolonged QT, seen by EP-->avoid meds with potential for QT prolongation.    Past Surgical History:  Procedure Laterality Date   ABDOMINAL HYSTERECTOMY  1997   APPENDECTOMY     CARDIAC CATHETERIZATION N/A 09/14/2016   Procedure: Left Heart Cath and Coronary Angiography;  Surgeon: ThTroy SineMD;  [facility];  Service: Cardiovascular; Mild nonobstructive CAD with 85% smooth narrowing in the first diagonal branch of the LAD; 10% smooth narrowing of the ostial proximal left circumflex coronary artery; and a normal dominant RCA.   CARPAL TUNNEL RELEASE     CATARACT EXTRACTION Bilateral    CHOLECYSTECTOMY  2002   COLONOSCOPY  10/24/00; 08/27/19   2002 normal.  [facility] hemoccult testing via [facility] 06/18/15 was NEG.  2020->diverticulosis o/w nl.   dexa  03/2016; 03/2018   03/2018 T-score L femoral neck  -2.7 (worsened compared to osteopenic range 2017.) DEXA 05/03/21 stable T score -2.6 on boniva x 2 yrs->plan rpt DEXA 2 yrs.    ESOPHAGOGASTRODUODENOSCOPY  08/27/2019   Normal.   IR GENERIC HISTORICAL  09/19/2016   IR SINUS/FIST TUBE CHK-NON GI 09/19/2016 JaCorrie MckusickDO [facility]   IR GENERIC HISTORICAL  10/06/2016   IR RADIOLOGIST EVAL & MGMT 10/06/2016 [facility]   IR GENERIC HISTORICAL  10/26/2016   IR RADIOLOGIST EVAL & MGMT 10/26/2016 [facility]   IR GENERIC HISTORICAL  11/03/2016   IR RADIOLOGIST EVAL & MGMT 11/03/2016 WeArdis RowanPA-C [facility]   IR GENERIC HISTORICAL  11/24/2016   IR RADIOLOGIST EVAL & MGMT 11/24/2016 WeArdis RowanPA-C [facility]   IR GENERIC HISTORICAL  12/05/2016   Fistula smaller/improving.  IR SINUS/FIST TUBE CHK-NON GI 12/05/2016 JoSandi MariscalMD [facility]   IR GENERIC HISTORICAL  12/22/2016   IR RADIOLOGIST EVAL & MGMT 12/22/2016 MiGreggory KeenMD [facility]   IR RADIOLOGIST EVAL & MGMT  01/10/2017   IR RADIOLOGIST EVAL & MGMT  02/09/2017   IR SINUS/FIST TUBE CHK-NON GI  03/28/2017  LEFT HEART CATHETERIZATION WITH CORONARY ANGIOGRAM N/A 05/30/2012   Procedure: LEFT HEART CATHETERIZATION WITH CORONARY ANGIOGRAM;  Surgeon: Burnell Blanks, MD;  Location: [facility] CATH LAB;  Service: Cardiovascular: Mild, non-obstructive CAD   TRANSTHORACIC ECHOCARDIOGRAM  09/14/2016; 11/2016   08/2016: EF 30-35 %, Akinesis of the mid-apicalanteroseptal myocardium.  Grade I DD.  Mild pulm HTN.  Repeat echo 11/2016, EF 40-45%, no rwma, nl RV fxn, mild TR, PASP 5mHg.     TRANSTHORACIC ECHOCARDIOGRAM  03/07/2017   EF 50-55%. Hypokinesis of the distal septum with overall low normal LV,  systolic function; mild diastolic dysfunction with elevated LV  filling pressure; mildly calcified aortic valve with mild AI; small pericardial effusion    Family History  Problem Relation Age of Onset   Heart failure Mother    Kidney disease Mother        renal failure   Diabetes Mother    Diabetes Father    Diabetes Brother    Heart disease Brother    Heart  attack Brother    Heart attack Brother    Heart attack Brother    Pancreatic cancer Brother    Colon cancer Neg Hx    Esophageal cancer Neg Hx    Stomach cancer Neg Hx    Rectal cancer Neg Hx     Social History   Socioeconomic History   Marital status: Widowed    Spouse name: Not on file   Number of children: Not on file   Years of education: Not on file   Highest education level: Not on file  Occupational History   Occupation: Retired CPublic affairs consultant UNEMPLOYED  Tobacco Use   Smoking status: Former    Packs/day: 1.00    Years: 45.00    Total pack years: 45.00    Types: Cigarettes    Quit date: 10/24/2008    Years since quitting: 13.5   Smokeless tobacco: Never  Vaping Use   Vaping Use: Never used  Substance and Sexual Activity   Alcohol use: No    Alcohol/week: 0.0 standard drinks of alcohol   Drug use: No   Sexual activity: Not on file  Other Topics Concern   Not on file  Social History Narrative   [place]   Husband died S2020-09-29(esoph cancer, copd, chf).   No alcohol.   Social Determinants of Health   Financial Resource Strain: Low Risk  (12/22/2021)   Overall Financial Resource Strain (CARDIA)    Difficulty of Paying Living Expenses: Not hard at all  Food Insecurity: No Food Insecurity (12/22/2021)   Hunger Vital Sign    Worried About Running Out of Food in the Last Year: Never true    Ran Out of Food in the Last Year: Never true  Transportation Needs: No Transportation Needs (12/22/2021)   PRAPARE - THydrologist(Medical): No    Lack of Transportation (Non-Medical): No  Physical Activity: Unknown (12/22/2021)   Exercise Vital Sign    Days of Exercise per Week: Not on file    Minutes of Exercise per Session: 10 min  Stress: No Stress Concern Present (12/22/2021)   FP[place]tt   Feeling of Stress : Only a  little  Social Connections: Socially Isolated (12/22/2021)   Social Connection and Isolation Panel [NHANES]    Frequency of Communication with Friends and Family: Twice a week  Frequency of Social Gatherings with Friends and Family: Never    Attends Religious Services: Never    Marine scientist or Organizations: No    Attends Archivist Meetings: Never    Marital Status: Widowed  Intimate Partner Violence: Not At Risk (12/22/2021)   Humiliation, Afraid, Rape, and Kick questionnaire    Fear of Current or Ex-Partner: No    Emotionally Abused: No    Physically Abused: No    Sexually Abused: No    Outpatient Medications Prior to Visit  Medication Sig Dispense Refill   albuterol (PROVENTIL) (2.5 MG/3ML) 0.083% nebulizer solution USE 1 VIAL IN NEBULIZER EVERY 4 HOURS AS NEEDED FOR WHEEZING AND FOR SHORTNESS OF BREATH (Patient taking differently: Take 2.5 mg by nebulization every 4 (four) hours as needed for wheezing or shortness of breath.) 90 mL 1   albuterol (PROVENTIL) (5 MG/ML) 0.5% nebulizer solution Take 1 mL (5 mg total) by nebulization every 6 (six) hours as needed for wheezing or shortness of breath. 20 mL 2   albuterol (VENTOLIN HFA) 108 (90 Base) MCG/ACT inhaler INHALE 2 PUFFS BY MOUTH EVERY 6 HOURS AS NEEDED FOR WHEEZING OR SHORTNESS OF BREATH (Patient taking differently: 2 puffs every 6 (six) hours as needed for wheezing.) 18 g 1   aspirin 81 MG EC tablet Take 81 mg by mouth daily.      atorvastatin (LIPITOR) 40 MG tablet Take 1 tablet (40 mg total) by mouth daily. 90 tablet 1   buPROPion (WELLBUTRIN XL) 150 MG 24 hr tablet Take 1 tablet by mouth once daily 90 tablet 0   Calcium Carbonate-Vit D-Min (CALTRATE 600+D PLUS MINERALS) 600-800 MG-UNIT TABS Take 1-2 tablets by mouth See admin instructions. Takes 1 tablet in the morning and 2 tablets at night     cetirizine (ZYRTEC) 10 MG tablet Take 10 mg by mouth daily.     escitalopram (LEXAPRO) 20 MG tablet Take 1 tablet  (20 mg total) by mouth daily. 90 tablet 1   fluticasone (FLOVENT HFA) 220 MCG/ACT inhaler Inhale 2 puffs into the lungs 2 (two) times daily. 12 g 11   ibandronate (BONIVA) 150 MG tablet Take 1 tablet (150 mg total) by mouth every 30 (thirty) days. Take in the morning with a full glass of water, on an empty stomach, and do not take anything else by mouth or lie down for the next 30 min. 3 tablet 3   insulin isophane & regular human KwikPen (HUMULIN 70/30 KWIKPEN) (70-30) 100 UNIT/ML KwikPen INJECT 4 UNITS SUBCUTANEOUSLY WITH BREAKFAST AND 2 UNITS WITH SUPPER 15 mL 1   Lancets (ONETOUCH DELICA PLUS HCWCBJ62G) MISC USE 1  TO CHECK GLUCOSE THREE TIMES DAILY 100 each 5   metoprolol tartrate (LOPRESSOR) 25 MG tablet Take 1/2 (one-half) tablet by mouth twice daily 90 tablet 1   Multiple Vitamins-Minerals (MULTIVITAMIN WOMEN PO) Take 1 tablet by mouth daily.     pantoprazole (PROTONIX) 40 MG tablet Take 1 tablet (40 mg total) by mouth daily. 90 tablet 1   albuterol (PROVENTIL) (2.5 MG/3ML) 0.083% nebulizer solution Take 6 mLs (5 mg total) by nebulization every 6 (six) hours as needed for wheezing or shortness of breath. 75 mL 12   cefdinir (OMNICEF) 300 MG capsule Take 1 capsule (300 mg total) by mouth 2 (two) times daily. 14 capsule 0   ONETOUCH ULTRA test strip USE 1 STRIP TO CHECK GLUCOSE THREE TIMES DAILY 100 each 11   predniSONE (DELTASONE) 50 MG tablet 1 p.o. daily  x5 5 tablet 0   No facility-administered medications prior to visit.    Allergies  Allergen Reactions   Lactose Intolerance (Gi) Diarrhea   Ibuprofen Other (See Comments)    REACTION: Anxiousness, hyperventilates    ROS Review of Systems  Constitutional:  Negative for appetite change, chills, fatigue and fever.  HENT:  Negative for congestion, dental problem, ear pain and sore throat.   Eyes:  Negative for discharge, redness and visual disturbance.  Respiratory:  Negative for cough, chest tightness, shortness of breath and  wheezing.   Cardiovascular:  Negative for chest pain, palpitations and leg swelling.  Gastrointestinal:  Negative for abdominal pain, blood in stool, diarrhea, nausea and vomiting.  Genitourinary:  Negative for difficulty urinating, dysuria, flank pain, frequency, hematuria and urgency.  Musculoskeletal:  Negative for arthralgias, back pain, joint swelling, myalgias and neck stiffness.  Skin:  Negative for pallor and rash.  Neurological:  Negative for dizziness, speech difficulty, weakness and headaches.  Hematological:  Negative for adenopathy. Does not bruise/bleed easily.  Psychiatric/Behavioral:  Negative for confusion and sleep disturbance. The patient is not nervous/anxious.     PE;    05/04/2022    9:36 AM 03/06/2022   10:30 PM 03/06/2022   10:15 PM  Vitals with BMI  Height 5' 2"     Weight 142 lbs 3 oz    BMI 26    Systolic 841 660 630  Diastolic 52 46 48  Pulse 79 112 109   Gen: Alert, well appearing.  Patient is oriented to person, place, time, and situation. AFFECT: pleasant, lucid thought and speech. ENT: Ears: EACs clear, normal epithelium.  TMs with good light reflex and landmarks bilaterally.  Eyes: no injection, icteris, swelling, or exudate.  EOMI, PERRLA. Nose: no drainage or turbinate edema/swelling.  No injection or focal lesion.  Mouth: lips without lesion/swelling.  Oral mucosa pink and moist.  Dentition intact and without obvious caries or gingival swelling.  Oropharynx without erythema, exudate, or swelling.  Neck: supple/nontender.  No LAD, mass, or TM.  Carotid pulses 2+ bilaterally, without bruits. CV: RRR, no m/r/g.   LUNGS: CTA bilat, nonlabored resps, good aeration in all lung fields. ABD: soft, NT, ND, BS normal.  No hepatospenomegaly or mass.  No bruits. EXT: no clubbing, cyanosis, or edema.  Musculoskeletal: no joint swelling, erythema, warmth, or tenderness.  ROM of all joints intact. Skin - no sores or suspicious lesions or rashes or color  changes  Pertinent labs:  Lab Results  Component Value Date   TSH 1.24 04/20/2017   Lab Results  Component Value Date   WBC 15.8 (H) 03/06/2022   HGB 12.5 03/06/2022   HCT 38.7 03/06/2022   MCV 91.9 03/06/2022   PLT 204 03/06/2022   Lab Results  Component Value Date   CREATININE 1.10 (H) 03/06/2022   BUN 27 (H) 03/06/2022   NA 136 03/06/2022   K 4.6 03/06/2022   CL 103 03/06/2022   CO2 28 03/06/2022   Lab Results  Component Value Date   ALT 14 03/06/2022   AST 21 03/06/2022   ALKPHOS 63 03/06/2022   BILITOT 0.5 03/06/2022   Lab Results  Component Value Date   CHOL 130 02/02/2022   Lab Results  Component Value Date   HDL 52.10 02/02/2022   Lab Results  Component Value Date   LDLCALC 55 02/02/2022   Lab Results  Component Value Date   TRIG 111.0 02/02/2022   Lab Results  Component Value Date  CHOLHDL 2 02/02/2022   Lab Results  Component Value Date   HGBA1C 6.4 (A) 05/04/2022   HGBA1C 6.4 05/04/2022   HGBA1C 6.4 05/04/2022   HGBA1C 6.4 05/04/2022   ASSESSMENT AND PLAN:   #1 type 2 diabetes, well controlled. POC Hba1c 6.4% today. Urine microalbumin/creatinine ordered today but she was unable to urinate because she had just emptied her bladder Electrolytes and creatinine today. Continue Humulin 70/30 40 units every morning and 2 units every afternoon.  #2 hypertension, well controlled on Lopressor 12.5 mg twice daily.  #3 chronic renal insufficiency stage III. Avoids NSAIDs and hydrates well. Electrolytes and creatinine today.  #4 Health maintenance exam: Reviewed age and gender appropriate health maintenance issues (prudent diet, regular exercise, health risks of tobacco and excessive alcohol, use of seatbelts, fire alarms in home, use of sunscreen).  Also reviewed age and gender appropriate health screening as well as vaccine recommendations. Vaccines: Up-to-date Labs: BMET, POC Hba1c, (urine microalb/cr ordered but she had already emptied her  bladder). Cervical ca screening: no further cerv ca screening indicated. Breast ca screening: mammogram 05/05/21 normal.  Rpt is due-->she plans on scheduling. Colon ca screening: 2020 colonoscopy normal.  No further colon cancer screening recommended due to age. Osteoporosis: 04/2021 DEXA with T score -2.6 (stable)-->continued boniva-->due for repeat DEXA approx 04/2023.  An After Visit Summary was printed and given to the patient.  FOLLOW UP:  Return in about 3 months (around 08/04/2022) for routine chronic illness f/u.  Signed:  Crissie Sickles, MD           05/04/2022

## 2022-05-21 ENCOUNTER — Emergency Department (HOSPITAL_COMMUNITY): Payer: Medicare Other

## 2022-05-21 ENCOUNTER — Other Ambulatory Visit: Payer: Self-pay

## 2022-05-21 ENCOUNTER — Observation Stay (HOSPITAL_COMMUNITY)
Admission: EM | Admit: 2022-05-21 | Discharge: 2022-05-23 | Disposition: A | Discharge status: Home Health | Payer: Medicare Other | Attending: Internal Medicine | Admitting: Internal Medicine

## 2022-05-21 DIAGNOSIS — E1122 Type 2 diabetes mellitus with diabetic chronic kidney disease: Secondary | ICD-10-CM | POA: Diagnosis not present

## 2022-05-21 DIAGNOSIS — Z7982 Long term (current) use of aspirin: Secondary | ICD-10-CM | POA: Insufficient documentation

## 2022-05-21 DIAGNOSIS — R531 Weakness: Secondary | ICD-10-CM | POA: Diagnosis not present

## 2022-05-21 DIAGNOSIS — E785 Hyperlipidemia, unspecified: Secondary | ICD-10-CM | POA: Diagnosis present

## 2022-05-21 DIAGNOSIS — R9431 Abnormal electrocardiogram [ECG] [EKG]: Secondary | ICD-10-CM | POA: Diagnosis not present

## 2022-05-21 DIAGNOSIS — Z743 Need for continuous supervision: Secondary | ICD-10-CM | POA: Diagnosis not present

## 2022-05-21 DIAGNOSIS — R0789 Other chest pain: Principal | ICD-10-CM | POA: Insufficient documentation

## 2022-05-21 DIAGNOSIS — I5032 Chronic diastolic (congestive) heart failure: Secondary | ICD-10-CM | POA: Diagnosis present

## 2022-05-21 DIAGNOSIS — Z87891 Personal history of nicotine dependence: Secondary | ICD-10-CM | POA: Insufficient documentation

## 2022-05-21 DIAGNOSIS — N1832 Chronic kidney disease, stage 3b: Secondary | ICD-10-CM | POA: Insufficient documentation

## 2022-05-21 DIAGNOSIS — J441 Chronic obstructive pulmonary disease with (acute) exacerbation: Secondary | ICD-10-CM | POA: Insufficient documentation

## 2022-05-21 DIAGNOSIS — R079 Chest pain, unspecified: Secondary | ICD-10-CM | POA: Diagnosis not present

## 2022-05-21 DIAGNOSIS — Z79899 Other long term (current) drug therapy: Secondary | ICD-10-CM | POA: Insufficient documentation

## 2022-05-21 DIAGNOSIS — R6889 Other general symptoms and signs: Secondary | ICD-10-CM | POA: Diagnosis not present

## 2022-05-21 DIAGNOSIS — R911 Solitary pulmonary nodule: Secondary | ICD-10-CM | POA: Diagnosis not present

## 2022-05-21 DIAGNOSIS — I130 Hypertensive heart and chronic kidney disease with heart failure and stage 1 through stage 4 chronic kidney disease, or unspecified chronic kidney disease: Secondary | ICD-10-CM | POA: Diagnosis not present

## 2022-05-21 DIAGNOSIS — I447 Left bundle-branch block, unspecified: Secondary | ICD-10-CM | POA: Diagnosis not present

## 2022-05-21 DIAGNOSIS — I951 Orthostatic hypotension: Secondary | ICD-10-CM | POA: Diagnosis not present

## 2022-05-21 DIAGNOSIS — N179 Acute kidney failure, unspecified: Secondary | ICD-10-CM | POA: Diagnosis not present

## 2022-05-21 DIAGNOSIS — Z85118 Personal history of other malignant neoplasm of bronchus and lung: Secondary | ICD-10-CM | POA: Insufficient documentation

## 2022-05-21 DIAGNOSIS — I2510 Atherosclerotic heart disease of native coronary artery without angina pectoris: Secondary | ICD-10-CM | POA: Diagnosis not present

## 2022-05-21 DIAGNOSIS — Z8673 Personal history of transient ischemic attack (TIA), and cerebral infarction without residual deficits: Secondary | ICD-10-CM | POA: Diagnosis not present

## 2022-05-21 DIAGNOSIS — I499 Cardiac arrhythmia, unspecified: Secondary | ICD-10-CM | POA: Diagnosis not present

## 2022-05-21 DIAGNOSIS — Z794 Long term (current) use of insulin: Secondary | ICD-10-CM | POA: Insufficient documentation

## 2022-05-21 DIAGNOSIS — I10 Essential (primary) hypertension: Secondary | ICD-10-CM | POA: Diagnosis present

## 2022-05-21 LAB — URINALYSIS, ROUTINE W REFLEX MICROSCOPIC
Bilirubin Urine: NEGATIVE
Glucose, UA: NEGATIVE mg/dL
Hgb urine dipstick: NEGATIVE
Ketones, ur: NEGATIVE mg/dL
Nitrite: NEGATIVE
Protein, ur: NEGATIVE mg/dL
Specific Gravity, Urine: 1.025 (ref 1.005–1.030)
WBC, UA: >50 WBC/hpf — ABNORMAL HIGH (ref 0–5)
pH: 6 (ref 5.0–8.0)

## 2022-05-21 LAB — BASIC METABOLIC PANEL
Anion gap: 5 (ref 5–15)
BUN: 20 mg/dL (ref 8–23)
CO2: 26 mmol/L (ref 22–32)
Calcium: 8.1 mg/dL — ABNORMAL LOW (ref 8.9–10.3)
Chloride: 107 mmol/L (ref 98–111)
Creatinine, Ser: 1.09 mg/dL — ABNORMAL HIGH (ref 0.44–1.00)
GFR, Estimated: 54 mL/min — ABNORMAL LOW (ref >60)
Glucose, Bld: 127 mg/dL — ABNORMAL HIGH (ref 70–99)
Potassium: 4.4 mmol/L (ref 3.5–5.1)
Sodium: 138 mmol/L (ref 135–145)

## 2022-05-21 LAB — CBC
HCT: 34.4 % — ABNORMAL LOW (ref 36.0–46.0)
Hemoglobin: 11.2 g/dL — ABNORMAL LOW (ref 12.0–15.0)
MCH: 29.6 pg (ref 26.0–34.0)
MCHC: 32.6 g/dL (ref 30.0–36.0)
MCV: 91 fL (ref 80.0–100.0)
Platelets: 199 10*3/uL (ref 150–400)
RBC: 3.78 MIL/uL — ABNORMAL LOW (ref 3.87–5.11)
RDW: 14.3 % (ref 11.5–15.5)
WBC: 7.3 10*3/uL (ref 4.0–10.5)
nRBC: 0 % (ref 0.0–0.2)

## 2022-05-21 LAB — TROPONIN I (HIGH SENSITIVITY)
Troponin I (High Sensitivity): 5 ng/L (ref <18)
Troponin I (High Sensitivity): 4 ng/L (ref <18)

## 2022-05-21 LAB — CREATININE, SERUM
Creatinine, Ser: 1.17 mg/dL — ABNORMAL HIGH (ref 0.44–1.00)
GFR, Estimated: 49 mL/min — ABNORMAL LOW (ref >60)

## 2022-05-21 LAB — CBG MONITORING, ED: Glucose-Capillary: 88 mg/dL (ref 70–99)

## 2022-05-21 LAB — GLUCOSE, CAPILLARY: Glucose-Capillary: 85 mg/dL (ref 70–99)

## 2022-05-21 MED ORDER — MELATONIN 5 MG PO TABS: 5.0000 mg | ORAL_TABLET | Freq: Every evening | ORAL | Status: DC | PRN

## 2022-05-21 MED ORDER — INSULIN ASPART 100 UNIT/ML IJ SOLN
0.0000 [IU] | Freq: Three times a day (TID) | INTRAMUSCULAR | Status: DC
  Administered 2022-05-22 – 2022-05-23 (×3): 2 [IU] via SUBCUTANEOUS

## 2022-05-21 MED ORDER — IOHEXOL 350 MG/ML SOLN
62.0000 mL | Freq: Once | INTRAVENOUS | Status: AC | PRN
  Administered 2022-05-21: 62 mL via INTRAVENOUS

## 2022-05-21 MED ORDER — POLYETHYLENE GLYCOL 3350 17 G PO PACK: 17.0000 g | PACK | Freq: Every day | ORAL | Status: DC | PRN

## 2022-05-21 MED ORDER — PANTOPRAZOLE SODIUM 40 MG PO TBEC
40.0000 mg | DELAYED_RELEASE_TABLET | Freq: Every day | ORAL | Status: DC
  Administered 2022-05-21 – 2022-05-23 (×3): 40 mg via ORAL
  Filled 2022-05-21 (×3): qty 1

## 2022-05-21 MED ORDER — ATORVASTATIN CALCIUM 40 MG PO TABS
40.0000 mg | ORAL_TABLET | Freq: Every day | ORAL | Status: DC
  Administered 2022-05-21 – 2022-05-22 (×2): 40 mg via ORAL
  Filled 2022-05-21 (×2): qty 1

## 2022-05-21 MED ORDER — ESCITALOPRAM OXALATE 10 MG PO TABS
20.0000 mg | ORAL_TABLET | Freq: Every day | ORAL | Status: DC
  Administered 2022-05-22 – 2022-05-23 (×2): 20 mg via ORAL
  Filled 2022-05-21 (×2): qty 2

## 2022-05-21 MED ORDER — ADULT MULTIVITAMIN W/MINERALS CH
ORAL_TABLET | Freq: Every day | ORAL | Status: DC
  Administered 2022-05-22 – 2022-05-23 (×2): 1 via ORAL
  Filled 2022-05-21 (×2): qty 1

## 2022-05-21 MED ORDER — PROCHLORPERAZINE EDISYLATE 10 MG/2ML IJ SOLN
10.0000 mg | Freq: Four times a day (QID) | INTRAMUSCULAR | Status: DC | PRN
Start: 2022-05-21 — End: 2022-05-23
  Administered 2022-05-22: 10 mg via INTRAVENOUS
  Filled 2022-05-21: qty 2

## 2022-05-21 MED ORDER — ENOXAPARIN SODIUM 40 MG/0.4ML IJ SOSY
40.0000 mg | PREFILLED_SYRINGE | INTRAMUSCULAR | Status: DC
  Administered 2022-05-21 – 2022-05-22 (×2): 40 mg via SUBCUTANEOUS
  Filled 2022-05-21 (×2): qty 0.4

## 2022-05-21 MED ORDER — INSULIN ASPART 100 UNIT/ML IJ SOLN: 0.0000 [IU] | Freq: Every day | INTRAMUSCULAR | Status: DC

## 2022-05-21 MED ORDER — ACETAMINOPHEN 325 MG PO TABS: 650.0000 mg | ORAL_TABLET | Freq: Four times a day (QID) | ORAL | Status: DC | PRN

## 2022-05-21 MED ORDER — SODIUM CHLORIDE 0.9 % IV BOLUS
1000.0000 mL | Freq: Once | INTRAVENOUS | Status: AC
Start: 2022-05-21 — End: 2022-05-21
  Administered 2022-05-21: 1000 mL via INTRAVENOUS

## 2022-05-21 MED ORDER — ASPIRIN 81 MG PO TBEC
81.0000 mg | DELAYED_RELEASE_TABLET | Freq: Every day | ORAL | Status: DC
  Administered 2022-05-21 – 2022-05-23 (×3): 81 mg via ORAL
  Filled 2022-05-21 (×3): qty 1

## 2022-05-21 NOTE — ED Notes (Signed)
ED TO INPATIENT HANDOFF REPORT  ED Nurse Name and Phone #: [phone]  S Name/Age/Gender [name] 76 y.o. female Room/Bed: 009C/009C  Code Status   Code Status: Full Code  Home/SNF/Other Home Patient oriented to: self, place, time, and situation Is this baseline? Yes   Triage Complete: Triage complete  Chief Complaint Chest pain [R07.9]  Triage Note Pt bib [facility] from home CP ongoing for 58mns. Pt denies n/v, only c/o dizziness. Pt had orthostatic changes 120/70 sitting, 80/40 standing. Pt received 3265masa, 500cc fluid. This am pt rates CP 10/10, now 0/10.   Allergies Allergies  Allergen Reactions   Lactose Intolerance (Gi) Diarrhea   Ibuprofen Other (See Comments)    REACTION: Anxiousness, hyperventilates    Level of Care/Admitting Diagnosis ED Disposition     ED Disposition  Admit   Condition  --   Comment  [id number]  Level of Care: Telemetry Cardiac [103]  May place patient in [facility] at [facility] equivalent level of care is available:: No  Covid Evaluation: Asymptomatic - no recent exposure (last 10 days) testing not required  Diagnosis: Chest pain [[id number]]Admitting Physician: HAKayleen Memos1[[id number]]Attending Physician: HAKayleen Memos1[[id number]]        B Medical/Surgery History Past Medical History:  Diagnosis Date   Acute renal failure ([place]   Age-related nuclear cataract of both eyes 2016   +cortical age related cataracts OU    Anxiety and depression    Bilateral diabetic retinopathy ([facility]   Dr. 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   Colonic diverticular abscess    COPD (chronic obstructive pulmonary disease) (HCC)    CVA (cerebral vascular accident) ([place]/2017    MRI did show a left-sided small ischemic stroke which was acute but likely incidental (MRI was done b/c pt had TIA sx's in hospital).  Neuro put her on plavix at that time.  Cardiology d/c'd her plavix 08/2017.   Diabetes mellitus with complication (HCC)    diab retinopathy OU (laser)   Diverticulitis of large intestine with perforation and abscess 09/11/2016   Finger fracture, right 04/2020   R 5th metacarpal fx at the base (Dr. LaMardelle Matte  GERD (gastroesophageal reflux disease)    protonix   History of concussion 08/25/2018   w/out loss of consciousness--mild increase in baseline memory impairment after.  CT head neg acute.   History of diverticulitis of colon    with abscess; required IR percutaneous drain placed 09/2016.   History of iron deficiency anemia    started iron 07/02/19.  EGD/colonoscopy 08/27/19 unrevealing. Capsule endoscopy considered but never done.   Hyperlipidemia    Hypertension    Left bundle branch block (LBBB) 12/16/2016   Lung cancer ([facility]   non-small cell lung ca, stage III in 05/2011; systemic chemotherapy concurrent with radiation followed by prophylactic cranial irradiation and has been observation since July of 2010 with no evidence for disease recurrence-released from onc f/u 12/2014 (needs annual cxr by PCP).  CXR 08/2015 stable.  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 MoEarlie Serverid bx and this was NEG for malignancy.   Mild cognitive impairment with memory loss    Likely from brain radiation therapy  Non-obstructive CAD (coronary artery disease)    a. Cath 2006 preserved LV fxn, scattered irregularities without critical stenosis; b. 2008 stress echo negative for ischemia, but with hypertensive response; c. 08/2016 Cath: D1 25%, otw nl.   Normocytic anemia 2018   Iron, vit B12, folate all normal 12/2016.   Open-angle glaucoma 2016   Dr. Shirley Muscat, (bilateral)---responding to topical therapy   Osteoarthritis of both hands    Osteoporosis 03/2016; 03/2018   03/2016 DEXA  T-score -2.2.  03/2018 T-score L femoral neck  -2.7--boniva started. DEXA 04/2021 T scor -2.6->boniva continued. Plan rpt DEXA 2 yrs.   Pneumonia    hospitalization 11/2016; hypoxemic resp failure--d/c'd home with home oxygen therapy   Pulmonary nodule, left 08/2021   LUL 11 mm ground glass, needs f/u CT 08/2022.   Seasonal allergic rhinitis    Subclinical hyperthyroidism 2018   Takotsubo cardiomyopathy    a. 08/2016 Echo: EF 30-35%, gr1DD, PASP 13mHg;  b. 08/2016 Cath: nonobs Dzs;  c. 11/2016 Echo: EF 40-45%, no rwma, nl RV fxn, mild TR, PASP 314mg.  ECHO 02/2017 EF 50-55%, grd II DD---essentially resolved Takotsubo 02/2017.   [place] de pointes ([place]11/2017   a. 08/2016 in setting of diverticulitis & pneumoperitoneum and Takotsubo CM -->prolonged QT, seen by EP-->avoid meds with potential for QT prolongation.   Past Surgical History:  Procedure Laterality Date   ABDOMINAL HYSTERECTOMY  1997   APPENDECTOMY     CARDIAC CATHETERIZATION N/A 09/14/2016   Procedure: Left Heart Cath and Coronary Angiography;  Surgeon: ThTroy SineMD;  [facility];  Service: Cardiovascular; Mild nonobstructive CAD with 85% smooth narrowing in the first diagonal branch of the LAD; 10% smooth narrowing of the ostial proximal left circumflex coronary artery; and a normal dominant RCA.   CARPAL TUNNEL RELEASE     CATARACT EXTRACTION Bilateral    CHOLECYSTECTOMY  2002   COLONOSCOPY  10/24/00; 08/27/19   2002 normal.  [employer] hemoccult testing via [facility] 06/18/15 was NEG.  2020->diverticulosis o/w nl.   dexa  03/2016; 03/2018   03/2018 T-score L femoral neck  -2.7 (worsened compared to osteopenic range 2017.) DEXA 05/03/21 stable T score -2.6 on boniva x 2 yrs->plan rpt DEXA 2 yrs.   ESOPHAGOGASTRODUODENOSCOPY  08/27/2019   Normal.   IR GENERIC HISTORICAL  09/19/2016   IR SINUS/FIST TUBE CHK-NON GI 09/19/2016 JaCorrie MckusickDO [facility]   IR GENERIC HISTORICAL  10/06/2016   IR RADIOLOGIST EVAL & MGMT  10/06/2016 [facility]   IR GENERIC HISTORICAL  10/26/2016   IR RADIOLOGIST EVAL & MGMT 10/26/2016 [facility]   IR GENERIC HISTORICAL  11/03/2016   IR RADIOLOGIST EVAL & MGMT 11/03/2016 WeArdis RowanPA-C [facility]   IR GENERIC HISTORICAL  11/24/2016   IR RADIOLOGIST EVAL & MGMT 11/24/2016 WeArdis RowanPA-C [facility]   IR GENERIC HISTORICAL  12/05/2016   Fistula smaller/improving.  IR SINUS/FIST TUBE CHK-NON GI 12/05/2016 JoSandi MariscalMD [facility]   IR GENERIC HISTORICAL  12/22/2016   IR RADIOLOGIST EVAL & MGMT 12/22/2016 MiGreggory KeenMD [facility]   IR RADIOLOGIST EVAL & MGMT  01/10/2017   IR RADIOLOGIST EVAL & MGMT  02/09/2017   IR SINUS/FIST TUBE CHK-NON GI  03/28/2017   LEFT HEART CATHETERIZATION WITH CORONARY ANGIOGRAM N/A 05/30/2012   Procedure: LEFT HEART CATHETERIZATION WITH CORONARY ANGIOGRAM;  Surgeon: ChBurnell BlanksMD;  [facility];  Service: Cardiovascular: Mild, non-obstructive CAD  TRANSTHORACIC ECHOCARDIOGRAM  09/14/2016; 11/2016   08/2016: EF 30-35 %, Akinesis of the mid-apicalanteroseptal myocardium.  Grade I DD.  Mild pulm HTN.  Repeat echo 11/2016, EF 40-45%, no rwma, nl RV fxn, mild TR, PASP 53mHg.     TRANSTHORACIC ECHOCARDIOGRAM  03/07/2017   EF 50-55%. Hypokinesis of the distal septum with overall low normal LV,  systolic function; mild diastolic dysfunction with elevated LV  filling pressure; mildly calcified aortic valve with mild AI; small pericardial effusion     A IV Location/Drains/Wounds Patient Lines/Drains/Airways Status     Active Line/Drains/Airways     Name Placement date Placement time Site Days   Peripheral IV 05/21/22 18 G Left Antecubital 05/21/22  --  Antecubital  less than 1   External Urinary Catheter 05/21/22  2149  --  less than 1            Intake/Output Last 24 hours  Intake/Output Summary (Last 24 hours) at 05/21/2022 2152 Last data filed at 05/21/2022 1942 Gross  per 24 hour  Intake 1000 ml  Output --  Net 1000 ml    Labs/Imaging Results for orders placed or performed during the hospital encounter of 05/21/22 (from the past 48 hour(s))  Basic metabolic panel     Status: Abnormal   Collection Time: 05/21/22  4:52 PM  Result Value Ref Range   Sodium 138 135 - 145 mmol/L   Potassium 4.4 3.5 - 5.1 mmol/L   Chloride 107 98 - 111 mmol/L   CO2 26 22 - 32 mmol/L   Glucose, Bld 127 (H) 70 - 99 mg/dL    Comment: Glucose reference range applies only to samples taken after fasting for at least 8 hours.   BUN 20 8 - 23 mg/dL   Creatinine, Ser 1.09 (H) 0.44 - 1.00 mg/dL   Calcium 8.1 (L) 8.9 - 10.3 mg/dL   GFR, Estimated 54 (L) >60 mL/min    Comment: (NOTE) Calculated using the CKD-EPI Creatinine Equation (2021)    Anion gap 5 5 - 15    Comment: Performed at [facility] [place] [id number] CBC     Status: Abnormal   Collection Time: 05/21/22  4:52 PM  Result Value Ref Range   WBC 7.3 4.0 - 10.5 K/uL   RBC 3.78 (L) 3.87 - 5.11 MIL/uL   Hemoglobin 11.2 (L) 12.0 - 15.0 g/dL   HCT 34.4 (L) 36.0 - 46.0 %   MCV 91.0 80.0 - 100.0 fL   MCH 29.6 26.0 - 34.0 pg   MCHC 32.6 30.0 - 36.0 g/dL   RDW 14.3 11.5 - 15.5 %   Platelets 199 150 - 400 K/uL   nRBC 0.0 0.0 - 0.2 %    Comment: Performed at [facility] [address] [id number] Troponin I (High Sensitivity)     Status: None   Collection Time: 05/21/22  4:52 PM  Result Value Ref Range   Troponin I (High Sensitivity) 5 <18 ng/L    Comment: (NOTE) Elevated high sensitivity troponin I (hsTnI) values and significant  changes across serial measurements may suggest ACS but many other  chronic and acute conditions are known to elevate hsTnI results.  Refer to the "Links" section for chest pain algorithms and additional  guidance. Performed at [facility] [address] [id number]  Troponin I (High Sensitivity)      Status: None   Collection Time:  05/21/22  7:41 PM  Result Value Ref Range   Troponin I (High Sensitivity) 4 <18 ng/L    Comment: (NOTE) Elevated high sensitivity troponin I (hsTnI) values and significant  changes across serial measurements may suggest ACS but many other  chronic and acute conditions are known to elevate hsTnI results.  Refer to the "Links" section for chest pain algorithms and additional  guidance. Performed at [facility], [place] [address] [place], [place] [address]   Creatinine, serum     Status: Abnormal   Collection Time: 05/21/22  7:41 PM  Result Value Ref Range   Creatinine, Ser 1.17 (H) 0.44 - 1.00 mg/dL   GFR, Estimated 49 (L) >60 mL/min    Comment: (NOTE) Calculated using the CKD-EPI Creatinine Equation (2021) Performed at [facility] [address] [place], [place] [address]   CBG monitoring, ED     Status: None   Collection Time: 05/21/22  9:32 PM  Result Value Ref Range   Glucose-Capillary 88 70 - 99 mg/dL    Comment: Glucose reference range applies only to samples taken after fasting for at least 8 hours.   CT Angio Chest PE W and/or Wo Contrast  Result Date: 05/21/2022 CLINICAL DATA:  Chest pain. EXAM: CT ANGIOGRAPHY CHEST WITH CONTRAST TECHNIQUE: Multidetector CT imaging of the chest was performed using the standard protocol during bolus administration of intravenous contrast. Multiplanar CT image reconstructions and MIPs were obtained to evaluate the vascular anatomy. RADIATION DOSE REDUCTION: This exam was performed according to the departmental dose-optimization program which includes automated exposure control, adjustment of the mA and/or kV according to patient size and/or use of iterative reconstruction technique. CONTRAST:  24m OMNIPAQUE IOHEXOL 350 MG/ML SOLN COMPARISON:  CT chest 09/08/2021 FINDINGS: Cardiovascular: Satisfactory opacification of the pulmonary arteries to the segmental level. No evidence of pulmonary embolism.  Normal heart size. Small pericardial effusion is unchanged. There are atherosclerotic calcifications of the aorta. Mediastinum/Nodes: Coarse calcification is again seen in the right thyroid gland. Esophagus appears nondilated. There is prominence of right hilar soft tissue similar to the prior study without discrete measurable lymph node. Lungs/Pleura: Pleural-parenchymal opacity with retraction and air bronchograms in the right upper lobe persists abutting the right hilum and upper right mediastinum. Right apical pleural thickening is unchanged. Overall dimensions have not significantly changed, but there is some increased narrowing of the right mainstem bronchus when compared to the prior exam. There is a trace layering right pleural effusion which is new. 12 mm ground-glass nodular density in the left lung apex is unchanged. No new focal lung infiltrate. Upper Abdomen: No acute findings. Cholecystectomy clips are present. Musculoskeletal: Degenerative changes affect the spine Review of the MIP images confirms the above findings. IMPRESSION: 1. No evidence for pulmonary embolism. 2. Stable appearance of right upper lobe/paramediastinal opacities and right apical pleural thickening. There has been mild progression of narrowing of the right mainstem bronchus which may represent progressive scarring or malignancy. 3. New trace right pleural effusion. 4. Stable small pericardial effusion. 5. 12 mm left upper lobe ground-glass nodule is unchanged. Initial follow-up with CT at 6 months is recommended to confirm persistence. If persistent, repeat CT is recommended every 2 years until 5 years of stability has been established. This recommendation follows the consensus statement: Guidelines for Management of Incidental Pulmonary Nodules Detected on CT Images: From the Fleischner Society 2017; Radiology 2017; 284:228-243. Aortic Atherosclerosis (ICD10-I70.0). Electronically Signed   By: ARonney AstersM.D.   On: 05/21/2022  19:59   DG Chest Port 1 View  Result Date: 05/21/2022 CLINICAL DATA:  Chest pain. EXAM: PORTABLE CHEST 1 VIEW COMPARISON:  Chest x-ray 03/06/2022.  Chest CT 09/08/2021. FINDINGS: Chronic parenchymal consolidation in the medial right upper lobe appear similar to the prior examinations. There is no new focal lung infiltrate. There is no pleural effusion or pneumothorax. The cardiomediastinal silhouette is within normal limits. There are surgical clips in the right abdomen. No acute fractures are seen. IMPRESSION: 1. No acute abnormality. 2. Stable chronic parenchymal opacities in the right upper lobe. Electronically Signed   By: Ronney Asters M.D.   On: 05/21/2022 17:00    Pending Labs Unresulted Labs (From admission, onward)     Start     Ordered   05/28/22 0500  Creatinine, serum  (enoxaparin (LOVENOX)    CrCl >/= 30 ml/min)  Weekly,   R     Comments: while on enoxaparin therapy    05/21/22 2103   05/21/22 2115  CBC  Once,   R        05/21/22 2115   05/21/22 1554  Urinalysis, Routine w reflex microscopic  Once,   URGENT        05/21/22 1553            Vitals/Pain Today's Vitals   05/21/22 1900 05/21/22 1939 05/21/22 1955 05/21/22 2030  BP: 122/88 (!) 110/51  115/75  Pulse: 72 72  76  Resp: _0 Temp:   98 F (36.7 C)   TempSrc:   Oral   SpO2: 98% 98%  98%  Weight:      Height:        Isolation Precautions No active isolations  Medications Medications  enoxaparin (LOVENOX) injection 40 mg (40 mg Subcutaneous Given 05/21/22 2130)  aspirin EC tablet 81 mg (81 mg Oral Given 05/21/22 2128)  atorvastatin (LIPITOR) tablet 40 mg (has no administration in time range)  escitalopram (LEXAPRO) tablet 20 mg (has no administration in time range)  pantoprazole (PROTONIX) EC tablet 40 mg (40 mg Oral Given 05/21/22 2128)  multivitamin with minerals tablet (has no administration in time range)  acetaminophen (TYLENOL) tablet 650 mg (has no administration in time range)   prochlorperazine (COMPAZINE) injection 10 mg (has no administration in time range)  melatonin tablet 5 mg (has no administration in time range)  polyethylene glycol (MIRALAX / GLYCOLAX) packet 17 g (has no administration in time range)  insulin aspart (novoLOG) injection 0-9 Units (has no administration in time range)  insulin aspart (novoLOG) injection 0-5 Units ( Subcutaneous Not Given 05/21/22 2133)  sodium chloride 0.9 % bolus 1,000 mL (0 mLs Intravenous Stopped 05/21/22 1942)  iohexol (OMNIPAQUE) 350 MG/ML injection 62 mL (62 mLs Intravenous Contrast Given 05/21/22 1944)    Mobility walks with person assist High fall risk   Focused Assessments Cardiac Assessment Handoff:  Cardiac Rhythm: Normal sinus rhythm Lab Results  Component Value Date   CKTOTAL 108 05/27/2012   CKMB 2.6 05/27/2012   TROPONINI 0.05 (HH) 12/02/2016   Lab Results  Component Value Date   DDIMER 1.19 (H) 10/15/2019   Does the Patient currently have chest pain? No    R Recommendations: See Admitting Provider Note  Report given to:   Additional Notes:   Patient has been A&O, slightly hard of hearing, One family member at bedside.

## 2022-05-21 NOTE — ED Provider Notes (Signed)
[facility] EMERGENCY DEPARTMENT Provider Note   CSN: [id number] Arrival date & time: 05/21/22  1542     History  Chief Complaint  Patient presents with   Chest Pain    [name] is a 76 y.o. female.  Pt is a 76 yo with a pmhx significant for DM, COPD, CAD, HLD, HTN, lung cancer, GERD, glaucoma, CVA, ckd, and diverticulitis.  Pt said she woke up with some chest pain and it did not go away, so she called EMS.  She did receive asa and her cp is better.  Pt was also orthostatic for EMS, so she was given IVFs.  Pt said she felt dizzy with standing.  She feels fine now.       Home Medications Prior to Admission medications   Medication Sig Start Date End Date Taking? Authorizing Provider  albuterol (PROVENTIL) (2.5 MG/3ML) 0.083% nebulizer solution USE 1 VIAL IN NEBULIZER EVERY 4 HOURS AS NEEDED FOR WHEEZING AND FOR SHORTNESS OF BREATH Patient taking differently: Take 2.5 mg by nebulization every 4 (four) hours as needed for wheezing or shortness of breath. 02/12/21   McGowen, [name], MD  albuterol (PROVENTIL) (5 MG/ML) 0.5% nebulizer solution Take 1 mL (5 mg total) by nebulization every 6 (six) hours as needed for wheezing or shortness of breath. 03/06/22   Lacretia Leigh, MD  albuterol (VENTOLIN HFA) 108 (90 Base) MCG/ACT inhaler INHALE 2 PUFFS BY MOUTH EVERY 6 HOURS AS NEEDED FOR WHEEZING OR SHORTNESS OF BREATH Patient taking differently: 2 puffs every 6 (six) hours as needed for wheezing. 06/05/20   McGowen, [name], MD  aspirin 81 MG EC tablet Take 81 mg by mouth daily.     [provider]  atorvastatin (LIPITOR) 40 MG tablet Take 1 tablet (40 mg total) by mouth daily. 02/02/22   [name], MD  buPROPion (WELLBUTRIN XL) 150 MG 24 hr tablet Take 1 tablet by mouth once daily 04/04/22   McGowen, [name], MD  Calcium Carbonate-Vit D-Min (CALTRATE 600+D PLUS MINERALS) 600-800 MG-UNIT TABS Take 1-2 tablets by mouth See admin instructions. Takes  1 tablet in the morning and 2 tablets at night    [provider]  cetirizine (ZYRTEC) 10 MG tablet Take 10 mg by mouth daily.    [provider]  escitalopram (LEXAPRO) 20 MG tablet Take 1 tablet (20 mg total) by mouth daily. 02/02/22   McGowen, [name], MD  fluticasone (FLOVENT HFA) 220 MCG/ACT inhaler Inhale 2 puffs into the lungs 2 (two) times daily. 10/28/20   McGowen, [name], MD  glucose blood (ONETOUCH ULTRA) test strip Use as instructed 05/04/22   McGowen, [name], MD  ibandronate (BONIVA) 150 MG tablet Take 1 tablet (150 mg total) by mouth every 30 (thirty) days. Take in the morning with a full glass of water, on an empty stomach, and do not take anything else by mouth or lie down for the next 30 min. 05/25/21   McGowen, [name], MD  insulin isophane & regular human KwikPen (HUMULIN 70/30 KWIKPEN) (70-30) 100 UNIT/ML KwikPen INJECT 4 UNITS SUBCUTANEOUSLY WITH BREAKFAST AND 2 UNITS WITH SUPPER 02/02/22   McGowen, [name], MD  Lancets (ONETOUCH DELICA PLUS [id number]) MISC USE 1  TO CHECK GLUCOSE THREE TIMES DAILY 02/02/22   McGowen, [name], MD  metoprolol tartrate (LOPRESSOR) 25 MG tablet Take 1/2 (one-half) tablet by mouth twice daily 02/02/22   McGowen, [name], MD  Multiple Vitamins-Minerals (MULTIVITAMIN WOMEN PO) Take  1 tablet by mouth daily.    [provider]  pantoprazole (PROTONIX) 40 MG tablet Take 1 tablet (40 mg total) by mouth daily. 02/02/22   McGowen, [name], MD      Allergies    Lactose intolerance (gi) and Ibuprofen    Review of Systems   Review of Systems  Cardiovascular:  Positive for chest pain.  Neurological:  Positive for dizziness.  All other systems reviewed and are negative.   Physical Exam Updated Vital Signs BP 115/75   Pulse 76   Temp 98 F (36.7 C) (Oral)   Resp 17   Ht 5\' 2"  (1.575 m)   Wt 64.5 kg   SpO2 98%   BMI 26.01 kg/m  Physical Exam Vitals and nursing note reviewed.  Constitutional:      Appearance: She is  well-developed.  HENT:     Head: Normocephalic and atraumatic.  Eyes:     Extraocular Movements: Extraocular movements intact.     Pupils: Pupils are equal, round, and reactive to light.  Cardiovascular:     Rate and Rhythm: Normal rate and regular rhythm.     Heart sounds: Normal heart sounds.  Pulmonary:     Effort: Pulmonary effort is normal.     Breath sounds: Normal breath sounds.  Abdominal:     General: Bowel sounds are normal.     Palpations: Abdomen is soft.  Musculoskeletal:        General: Normal range of motion.     Cervical back: Normal range of motion and neck supple.  Skin:    General: Skin is warm.     Capillary Refill: Capillary refill takes less than 2 seconds.  Neurological:     General: No focal deficit present.     Mental Status: She is alert and oriented to person, place, and time.  Psychiatric:        Mood and Affect: Mood normal.        Behavior: Behavior normal.     ED Results / Procedures / Treatments   Labs (all labs ordered are listed, but only abnormal results are displayed) Labs Reviewed  BASIC METABOLIC PANEL - Abnormal; Notable for the following components:      Result Value   Glucose, Bld 127 (*)    Creatinine, Ser 1.09 (*)    Calcium 8.1 (*)    GFR, Estimated 54 (*)    All other components within normal limits  CBC - Abnormal; Notable for the following components:   RBC 3.78 (*)    Hemoglobin 11.2 (*)    HCT 34.4 (*)    All other components within normal limits  URINALYSIS, ROUTINE W REFLEX MICROSCOPIC  TROPONIN I (HIGH SENSITIVITY)  TROPONIN I (HIGH SENSITIVITY)    EKG EKG Interpretation  Date/Time:  Saturday May 21 2022 20:42:01 EDT Ventricular Rate:  72 PR Interval:  207 QRS Duration: 144 QT Interval:  488 QTC Calculation: 535 R Axis:   18 Text Interpretation: Sinus rhythm Left bundle branch block No significant change since last tracing Confirmed by Isla Pence [phone]) on 05/21/2022 8:45:31 PM  Radiology CT  Angio Chest PE W and/or Wo Contrast  Result Date: 05/21/2022 CLINICAL DATA:  Chest pain. EXAM: CT ANGIOGRAPHY CHEST WITH CONTRAST TECHNIQUE: Multidetector CT imaging of the chest was performed using the standard protocol during bolus administration of intravenous contrast. Multiplanar CT image reconstructions and MIPs were obtained to evaluate the vascular anatomy. RADIATION DOSE REDUCTION: This exam was performed according to the  departmental dose-optimization program which includes automated exposure control, adjustment of the mA and/or kV according to patient size and/or use of iterative reconstruction technique. CONTRAST:  61mL OMNIPAQUE IOHEXOL 350 MG/ML SOLN COMPARISON:  CT chest 09/08/2021 FINDINGS: Cardiovascular: Satisfactory opacification of the pulmonary arteries to the segmental level. No evidence of pulmonary embolism. Normal heart size. Small pericardial effusion is unchanged. There are atherosclerotic calcifications of the aorta. Mediastinum/Nodes: Coarse calcification is again seen in the right thyroid gland. Esophagus appears nondilated. There is prominence of right hilar soft tissue similar to the prior study without discrete measurable lymph node. Lungs/Pleura: Pleural-parenchymal opacity with retraction and air bronchograms in the right upper lobe persists abutting the right hilum and upper right mediastinum. Right apical pleural thickening is unchanged. Overall dimensions have not significantly changed, but there is some increased narrowing of the right mainstem bronchus when compared to the prior exam. There is a trace layering right pleural effusion which is new. 12 mm ground-glass nodular density in the left lung apex is unchanged. No new focal lung infiltrate. Upper Abdomen: No acute findings. Cholecystectomy clips are present. Musculoskeletal: Degenerative changes affect the spine Review of the MIP images confirms the above findings. IMPRESSION: 1. No evidence for pulmonary embolism. 2.  Stable appearance of right upper lobe/paramediastinal opacities and right apical pleural thickening. There has been mild progression of narrowing of the right mainstem bronchus which may represent progressive scarring or malignancy. 3. New trace right pleural effusion. 4. Stable small pericardial effusion. 5. 12 mm left upper lobe ground-glass nodule is unchanged. Initial follow-up with CT at 6 months is recommended to confirm persistence. If persistent, repeat CT is recommended every 2 years until 5 years of stability has been established. This recommendation follows the consensus statement: Guidelines for Management of Incidental Pulmonary Nodules Detected on CT Images: From the Fleischner Society 2017; Radiology 2017; 284:228-243. Aortic Atherosclerosis (ICD10-I70.0). Electronically Signed   By: Ronney Asters M.D.   On: 05/21/2022 19:59   DG Chest Port 1 View  Result Date: 05/21/2022 CLINICAL DATA:  Chest pain. EXAM: PORTABLE CHEST 1 VIEW COMPARISON:  Chest x-ray 03/06/2022.  Chest CT 09/08/2021. FINDINGS: Chronic parenchymal consolidation in the medial right upper lobe appear similar to the prior examinations. There is no new focal lung infiltrate. There is no pleural effusion or pneumothorax. The cardiomediastinal silhouette is within normal limits. There are surgical clips in the right abdomen. No acute fractures are seen. IMPRESSION: 1. No acute abnormality. 2. Stable chronic parenchymal opacities in the right upper lobe. Electronically Signed   By: Ronney Asters M.D.   On: 05/21/2022 17:00    Procedures Procedures    Medications Ordered in ED Medications  sodium chloride 0.9 % bolus 1,000 mL (0 mLs Intravenous Stopped 05/21/22 1942)  iohexol (OMNIPAQUE) 350 MG/ML injection 62 mL (62 mLs Intravenous Contrast Given 05/21/22 1944)    ED Course/ Medical Decision Making/ A&P                           Medical Decision Making Amount and/or Complexity of Data Reviewed Labs: ordered. Radiology:  ordered.  Risk Prescription drug management. Decision regarding hospitalization.   This patient presents to the ED for concern of cp, this involves an extensive number of treatment options, and is a complaint that carries with it a high risk of complications and morbidity.  The differential diagnosis includes cardiac, pulm, gi   Co morbidities that complicate the patient evaluation  DM, COPD, CAD,  HLD, lung cancer, GERD, glaucoma, CVA, ckd, and diverticulitis   Additional history obtained:  Additional history obtained from epic chart review External records from outside source obtained and reviewed including EMS report   Lab Tests:  I Ordered, and personally interpreted labs.  The pertinent results include:  cbc with hgb 11.2 (chronic), bmp with cr 1.09 (chronic), trop nl   Imaging Studies ordered:  I ordered imaging studies including cxr and ct chest I independently visualized and interpreted imaging which showed  CXR IMPRESSION:  1. No acute abnormality.  2. Stable chronic parenchymal opacities in the right upper lobe.   I agree with the radiologist interpretation   Cardiac Monitoring:  The patient was maintained on a cardiac monitor.  I personally viewed and interpreted the cardiac monitored which showed an underlying rhythm of: nsr   Medicines ordered and prescription drug management:  I ordered medication including IVFs  for orthostasis  Reevaluation of the patient after these medicines showed that the patient improved I have reviewed the patients home medicines and have made adjustments as needed   Test Considered:  ct   Critical Interventions:  ct   Consultations Obtained:  I requested consultation with the hospitalist (Dr. Nevada Crane),  and discussed lab and imaging findings as well as pertinent plan - she will admit   Problem List / ED Course:  CP:  moderately suspicious story.  Heart score of 6.  Last cardiac cath was in 2017.  Last ECHO in  2018.   Reevaluation:  After the interventions noted above, I reevaluated the patient and found that they have :improved   Social Determinants of Health:  Lives at home   Dispostion:  After consideration of the diagnostic results and the patients response to treatment, I feel that the patent would benefit from admission for obs.          Final Clinical Impression(s) / ED Diagnoses Final diagnoses:  Chest pain, unspecified type    Rx / DC Orders ED Discharge Orders     None         Isla Pence, MD 05/21/22 2102

## 2022-05-21 NOTE — H&P (Signed)
History and Physical  Shiva Karis ZMO:[id number] DOB: [birth year] DOA: 05/21/2022  Referring physician: Dr. Gilford Raid, EDP  PCP: [name], MD  Outpatient Specialists: GI Patient coming from: Home  Chief Complaint: Chest pain   HPI: [name] is a 76 y.o. female with medical history significant for type 2 diabetes, hypertension, hyperlipidemia, CKD 3B, COPD, history of right lung cancer with previous radiation therapy, who presented to [facility] ED with complaints of chest pain.  Onset after she woke up.  Pressure-like and squeezing intermittently.  EMS was activated.  The patient had positive orthostatic vital signs.  She received full dose aspirin and 500 cc IV fluid bolus.  Upon arrival to the ED her chest pain resolved initially and then recurred.  Patient has a heart score of 6.  High-sensitivity troponin negative x2.  The patient has an old LBBB.  No evidence of acute ischemia on 12 lead EKG.  EDP requesting admission for observation.  The patient was admitted by the hospitalist service, TRH.  ED Course: Tmax 98.  BP 104/62, pulse 109, respiratory 16, with saturation 100% on room air.   Review of Systems: Review of systems as noted in the HPI. All other systems reviewed and are negative.   Past Medical History:  Diagnosis Date   Acute renal failure (HCC)    Age-related nuclear cataract of both eyes 2016   +cortical age related cataracts OU    Anxiety and depression    Bilateral diabetic retinopathy ([place]) 2015   Dr. 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   Colonic diverticular abscess    COPD (chronic obstructive pulmonary disease) (HCC)    CVA (cerebral vascular accident) ([place]) 09/2016   MRI did show a left-sided small ischemic stroke which was acute but likely incidental  (MRI was done b/c pt had TIA sx's in hospital).  Neuro put her on plavix at that time.  Cardiology d/c'd her plavix 08/2017.   Diabetes mellitus with complication (HCC)    diab retinopathy OU (laser)   Diverticulitis of large intestine with perforation and abscess 09/11/2016   Finger fracture, right 04/2020   R 5th metacarpal fx at the base (Dr. Mardelle Matte)   GERD (gastroesophageal reflux disease)    protonix   History of concussion 08/25/2018   w/out loss of consciousness--mild increase in baseline memory impairment after.  CT head neg acute.   History of diverticulitis of colon    with abscess; required IR percutaneous drain placed 09/2016.   History of iron deficiency anemia    started iron 07/02/19.  EGD/colonoscopy 08/27/19 unrevealing. Capsule endoscopy considered but never done.   Hyperlipidemia    Hypertension    Left bundle branch block (LBBB) 12/16/2016   Lung cancer ([place])    non-small cell lung ca, stage III in 05/2011; systemic chemotherapy concurrent with radiation followed by prophylactic cranial irradiation and has been observation since July of 2010 with no evidence for disease recurrence-released from onc f/u 12/2014 (needs annual cxr by PCP).  CXR 08/2015 stable.  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 Earlie Server did bx and this was NEG for malignancy.   Mild cognitive impairment with memory loss    Likely from brain radiation therapy   Non-obstructive CAD (coronary artery disease)    a. Cath 2006 preserved LV fxn,  scattered irregularities without critical stenosis; b. 2008 stress echo negative for ischemia, but with hypertensive response; c. 08/2016 Cath: D1 25%, otw nl.   Normocytic anemia 2018   Iron, vit B12, folate all normal 12/2016.   Open-angle glaucoma 2016   Dr. Bernstorf, (bilateral)---responding to topical therapy   Osteoarthritis of both hands    Osteoporosis 03/2016; 03/2018   03/2016 DEXA T-score -2.2.  03/2018 T-score L femoral neck  -2.7--boniva started. DEXA 04/2021 T  scor -2.6->boniva continued. Plan rpt DEXA 2 yrs.   Pneumonia    hospitalization 11/2016; hypoxemic resp failure--d/c'd home with home oxygen therapy   Pulmonary nodule, left 08/2021   LUL 11 mm ground glass, needs f/u CT 08/2022.   Seasonal allergic rhinitis    Subclinical hyperthyroidism 2018   Takotsubo cardiomyopathy    a. 08/2016 Echo: EF 30-35%, gr1DD, PASP 38mmHg;  b. 08/2016 Cath: nonobs Dzs;  c. 11/2016 Echo: EF 40-45%, no rwma, nl RV fxn, mild TR, PASP 37mmHg.  ECHO 02/2017 EF 50-55%, grd II DD---essentially resolved Takotsubo 02/2017.   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 08/2016 in setting of diverticulitis & pneumoperitoneum and Takotsubo CM -->prolonged QT, seen by EP-->avoid meds with potential for QT prolongation.   Past Surgical History:  Procedure Laterality Date   ABDOMINAL HYSTERECTOMY  1997   APPENDECTOMY     CARDIAC CATHETERIZATION N/A 09/14/2016   Procedure: Left Heart Cath and Coronary Angiography;  Surgeon: Thomas A Kelly, MD;  Location: [facility];  Service: Cardiovascular; Mild nonobstructive CAD with 85% smooth narrowing in the first diagonal branch of the LAD; 10% smooth narrowing of the ostial proximal left circumflex coronary artery; and a normal dominant RCA.   CARPAL TUNNEL RELEASE     CATARACT EXTRACTION Bilateral    CHOLECYSTECTOMY  2002   COLONOSCOPY  10/24/00; 08/27/19   2002 normal.  [employer] hemoccult testing via [facility] 06/18/15 was NEG.  2020->diverticulosis o/w nl.   dexa  03/2016; 03/2018   03/2018 T-score L femoral neck  -2.7 (worsened compared to osteopenic range 2017.) DEXA 05/03/21 stable T score -2.6 on boniva x 2 yrs->plan rpt DEXA 2 yrs.   ESOPHAGOGASTRODUODENOSCOPY  08/27/2019   Normal.   IR GENERIC HISTORICAL  09/19/2016   IR SINUS/FIST TUBE CHK-NON GI 09/19/2016 Jaime Wagner, DO [facility]   IR GENERIC HISTORICAL  10/06/2016   IR RADIOLOGIST EVAL & MGMT 10/06/2016 [facility]   IR GENERIC HISTORICAL  10/26/2016   IR  RADIOLOGIST EVAL & MGMT 10/26/2016 [facility]   IR GENERIC HISTORICAL  11/03/2016   IR RADIOLOGIST EVAL & MGMT 11/03/2016 Wendy Sanders Blair, PA-C [facility]   IR GENERIC HISTORICAL  11/24/2016   IR RADIOLOGIST EVAL & MGMT 11/24/2016 Wendy Sanders Blair, PA-C [facility]   IR GENERIC HISTORICAL  12/05/2016   Fistula smaller/improving.  IR SINUS/FIST TUBE CHK-NON GI 12/05/2016 John Watts, MD [facility]   IR GENERIC HISTORICAL  12/22/2016   IR RADIOLOGIST EVAL & MGMT 12/22/2016 Michael Shick, MD [facility]   IR RADIOLOGIST EVAL & MGMT  01/10/2017   IR RADIOLOGIST EVAL & MGMT  02/09/2017   IR SINUS/FIST TUBE CHK-NON GI  03/28/2017   LEFT HEART CATHETERIZATION WITH CORONARY ANGIOGRAM N/A 05/30/2012   Procedure: LEFT HEART CATHETERIZATION WITH CORONARY ANGIOGRAM;  Surgeon: Christopher D McAlhany, MD;  Location: [facility] C[facility];  Service: Cardiovascular: Mild, non-obstructive CAD   TRANSTHORACIC ECHOCARDIOGRAM  09/14/2016; 11/2016   08/2016: EF 30-35 %, Akinesis   of the mid-apicalanteroseptal myocardium.  Grade I DD.  Mild pulm HTN.  Repeat echo 11/2016, EF 40-45%, no rwma, nl RV fxn, mild TR, PASP 37mmHg.     TRANSTHORACIC ECHOCARDIOGRAM  03/07/2017   EF 50-55%. Hypokinesis of the distal septum with overall low normal LV,  systolic function; mild diastolic dysfunction with elevated LV  filling pressure; mildly calcified aortic valve with mild AI; small pericardial effusion    Social History:  reports that she quit smoking about 13 years ago. Her smoking use included cigarettes. She has a 45.00 pack-year smoking history. She has never used smokeless tobacco. She reports that she does not drink alcohol and does not use drugs.   Allergies  Allergen Reactions   Lactose Intolerance (Gi) Diarrhea   Ibuprofen Other (See Comments)    REACTION: Anxiousness, hyperventilates    Family History  Problem Relation Age of Onset   Heart failure Mother    Kidney disease Mother        renal  failure   Diabetes Mother    Diabetes Father    Diabetes Brother    Heart disease Brother    Heart attack Brother    Heart attack Brother    Heart attack Brother    Pancreatic cancer Brother    Colon cancer Neg Hx    Esophageal cancer Neg Hx    Stomach cancer Neg Hx    Rectal cancer Neg Hx       Prior to Admission medications   Medication Sig Start Date End Date Taking? Authorizing Provider  albuterol (PROVENTIL) (2.5 MG/3ML) 0.083% nebulizer solution USE 1 VIAL IN NEBULIZER EVERY 4 HOURS AS NEEDED FOR WHEEZING AND FOR SHORTNESS OF BREATH Patient taking differently: Take 2.5 mg by nebulization every 4 (four) hours as needed for wheezing or shortness of breath. 02/12/21   McGowen, Philip H, MD  albuterol (PROVENTIL) (5 MG/ML) 0.5% nebulizer solution Take 1 mL (5 mg total) by nebulization every 6 (six) hours as needed for wheezing or shortness of breath. 03/06/22   Allen, Anthony, MD  albuterol (VENTOLIN HFA) 108 (90 Base) MCG/ACT inhaler INHALE 2 PUFFS BY MOUTH EVERY 6 HOURS AS NEEDED FOR WHEEZING OR SHORTNESS OF BREATH Patient taking differently: 2 puffs every 6 (six) hours as needed for wheezing. 06/05/20   McGowen, Philip H, MD  aspirin 81 MG EC tablet Take 81 mg by mouth daily.     [provider]  atorvastatin (LIPITOR) 40 MG tablet Take 1 tablet (40 mg total) by mouth daily. 02/02/22   McGowen, Philip H, MD  buPROPion (WELLBUTRIN XL) 150 MG 24 hr tablet Take 1 tablet by mouth once daily 04/04/22   McGowen, Philip H, MD  Calcium Carbonate-Vit D-Min (CALTRATE 600+D PLUS MINERALS) 600-800 MG-UNIT TABS Take 1-2 tablets by mouth See admin instructions. Takes 1 tablet in the morning and 2 tablets at night    [provider]  cetirizine (ZYRTEC) 10 MG tablet Take 10 mg by mouth daily.    [provider]  escitalopram (LEXAPRO) 20 MG tablet Take 1 tablet (20 mg total) by mouth daily. 02/02/22   McGowen, Philip H, MD  fluticasone (FLOVENT HFA) 220 MCG/ACT inhaler Inhale  2 puffs into the lungs 2 (two) times daily. 10/28/20   McGowen, Philip H, MD  glucose blood (ONETOUCH ULTRA) test strip Use as instructed 05/04/22   McGowen, Philip H, MD  ibandronate (BONIVA) 150 MG tablet Take 1 tablet (150 mg total) by mouth every 30 (thirty) days. Take in the   morning with a full glass of water, on an empty stomach, and do not take anything else by mouth or lie down for the next 30 min. 05/25/21   McGowen, Philip H, MD  insulin isophane & regular human KwikPen (HUMULIN 70/30 KWIKPEN) (70-30) 100 UNIT/ML KwikPen INJECT 4 UNITS SUBCUTANEOUSLY WITH BREAKFAST AND 2 UNITS WITH SUPPER 02/02/22   McGowen, Philip H, MD  Lancets (ONETOUCH DELICA PLUS LANCET33G) MISC USE 1  TO CHECK GLUCOSE THREE TIMES DAILY 02/02/22   McGowen, Philip H, MD  metoprolol tartrate (LOPRESSOR) 25 MG tablet Take 1/2 (one-half) tablet by mouth twice daily 02/02/22   McGowen, Philip H, MD  Multiple Vitamins-Minerals (MULTIVITAMIN WOMEN PO) Take 1 tablet by mouth daily.    [provider]  pantoprazole (PROTONIX) 40 MG tablet Take 1 tablet (40 mg total) by mouth daily. 02/02/22   McGowen, Philip H, MD    Physical Exam: BP 115/75   Pulse 76   Temp 98 F (36.7 C) (Oral)   Resp 17   Ht 5' 2" (1.575 m)   Wt 64.5 kg   SpO2 98%   BMI 26.01 kg/m   General: 75 y.o. year-old female well developed well nourished in no acute distress.  Alert and oriented x3. Cardiovascular: Regular rate and rhythm with no rubs or gallops.  No thyromegaly or JVD noted.  No lower extremity edema. 2/4 pulses in all 4 extremities. Respiratory: Clear to auscultation with no wheezes or rales. Good inspiratory effort. Abdomen: Soft nontender nondistended with normal bowel sounds x4 quadrants. Muskuloskeletal: No cyanosis, clubbing or edema noted bilaterally Neuro: CN II-XII intact, strength, sensation, reflexes Skin: No ulcerative lesions noted or rashes Psychiatry: Judgement and insight appear normal. Mood is appropriate for condition  and setting          Labs on Admission:  Basic Metabolic Panel: Recent Labs  Lab 05/21/22 1652  NA 138  K 4.4  CL 107  CO2 26  GLUCOSE 127*  BUN 20  CREATININE 1.09*  CALCIUM 8.1*   Liver Function Tests: No results for input(s): "AST", "ALT", "ALKPHOS", "BILITOT", "PROT", "ALBUMIN" in the last 168 hours. No results for input(s): "LIPASE", "AMYLASE" in the last 168 hours. No results for input(s): "AMMONIA" in the last 168 hours. CBC: Recent Labs  Lab 05/21/22 1652  WBC 7.3  HGB 11.2*  HCT 34.4*  MCV 91.0  PLT 199   Cardiac Enzymes: No results for input(s): "CKTOTAL", "CKMB", "CKMBINDEX", "TROPONINI" in the last 168 hours.  BNP (last 3 results) No results for input(s): "BNP" in the last 8760 hours.  ProBNP (last 3 results) No results for input(s): "PROBNP" in the last 8760 hours.  CBG: No results for input(s): "GLUCAP" in the last 168 hours.  Radiological Exams on Admission: CT Angio Chest PE W and/or Wo Contrast  Result Date: 05/21/2022 CLINICAL DATA:  Chest pain. EXAM: CT ANGIOGRAPHY CHEST WITH CONTRAST TECHNIQUE: Multidetector CT imaging of the chest was performed using the standard protocol during bolus administration of intravenous contrast. Multiplanar CT image reconstructions and MIPs were obtained to evaluate the vascular anatomy. RADIATION DOSE REDUCTION: This exam was performed according to the departmental dose-optimization program which includes automated exposure control, adjustment of the mA and/or kV according to patient size and/or use of iterative reconstruction technique. CONTRAST:  62mL OMNIPAQUE IOHEXOL 350 MG/ML SOLN COMPARISON:  CT chest 09/08/2021 FINDINGS: Cardiovascular: Satisfactory opacification of the pulmonary arteries to the segmental level. No evidence of pulmonary embolism. Normal heart size. Small pericardial effusion is unchanged.   There are atherosclerotic calcifications of the aorta. Mediastinum/Nodes: Coarse calcification is again seen  in the right thyroid gland. Esophagus appears nondilated. There is prominence of right hilar soft tissue similar to the prior study without discrete measurable lymph node. Lungs/Pleura: Pleural-parenchymal opacity with retraction and air bronchograms in the right upper lobe persists abutting the right hilum and upper right mediastinum. Right apical pleural thickening is unchanged. Overall dimensions have not significantly changed, but there is some increased narrowing of the right mainstem bronchus when compared to the prior exam. There is a trace layering right pleural effusion which is new. 12 mm ground-glass nodular density in the left lung apex is unchanged. No new focal lung infiltrate. Upper Abdomen: No acute findings. Cholecystectomy clips are present. Musculoskeletal: Degenerative changes affect the spine Review of the MIP images confirms the above findings. IMPRESSION: 1. No evidence for pulmonary embolism. 2. Stable appearance of right upper lobe/paramediastinal opacities and right apical pleural thickening. There has been mild progression of narrowing of the right mainstem bronchus which may represent progressive scarring or malignancy. 3. New trace right pleural effusion. 4. Stable small pericardial effusion. 5. 12 mm left upper lobe ground-glass nodule is unchanged. Initial follow-up with CT at 6 months is recommended to confirm persistence. If persistent, repeat CT is recommended every 2 years until 5 years of stability has been established. This recommendation follows the consensus statement: Guidelines for Management of Incidental Pulmonary Nodules Detected on CT Images: From the Fleischner Society 2017; Radiology 2017; 284:228-243. Aortic Atherosclerosis (ICD10-I70.0). Electronically Signed   By: Ronney Asters M.D.   On: 05/21/2022 19:59   DG Chest Port 1 View  Result Date: 05/21/2022 CLINICAL DATA:  Chest pain. EXAM: PORTABLE CHEST 1 VIEW COMPARISON:  Chest x-ray 03/06/2022.  Chest CT  09/08/2021. FINDINGS: Chronic parenchymal consolidation in the medial right upper lobe appear similar to the prior examinations. There is no new focal lung infiltrate. There is no pleural effusion or pneumothorax. The cardiomediastinal silhouette is within normal limits. There are surgical clips in the right abdomen. No acute fractures are seen. IMPRESSION: 1. No acute abnormality. 2. Stable chronic parenchymal opacities in the right upper lobe. Electronically Signed   By: Ronney Asters M.D.   On: 05/21/2022 17:00    EKG: I independently viewed the EKG done and my findings are as followed: Sinus rhythm rate of 72.  RBBB.  QTc 535.  Assessment/Plan Present on Admission:  Chest pain  Principal Problem:   Chest pain  Chest pain, rule out ACS High-sensitivity troponin negative x2 Old LBBB on twelve-lead EKG Monitor on telemetry Obtain 2D echo As needed analgesics Consult cardiology in the morning  History of right lung cancer previous radiation therapy Pulmonary nodule on CT scan CT angio chest negative for PE Stable appearance of right upper lobe paramediastinal opacities and right apical pleural thickening.  Mild progression of narrowing of the right mainstem bronchus which may represent progressive scarring or malignancy.  New trace right pleural effusion.  Stable small pericardial effusion. 12 mm left upper lobe groundglass nodule is unchanged.  Type 2 diabetes with hyperglycemia Last hemoglobin A1c 6.4 on 05/04/2022 from 7.0 on 02/10/2022. Start insulin sliding scale.  Chronic anxiety/depression Resume home Lexapro  GERD Resume home Protonix.  Prolonged Qtc Admission twelve-lead EKG with QTc of 535 Optimize magnesium and potassium levels Avoid QTc prolonging agents Obtain 12 EKG in the morning.  Generalized weakness PT OT to assess Fall precautions TOC consulted to assist with DC planning.   DVT prophylaxis:  Subcu Lovenox daily  Code Status: Full code  Family  Communication: None at bedside  Disposition Plan: Admitted to telemetry cardiac unit  Consults called: None.  Admission status: Observation status.   Status is: Observation     N  MD [facility] Pager [phone]  If 7PM-7AM, please contact night-coverage [email or web address] Password [email or web address]  05/21/2022, 9:09 PM   

## 2022-05-21 NOTE — ED Triage Notes (Signed)
Pt bib [facility] from home CP ongoing for 46mins. Pt denies n/v, only c/o dizziness. Pt had orthostatic changes 120/70 sitting, 80/40 standing. Pt received 324mg  asa, 500cc fluid. This am pt rates CP 10/10, now 0/10.

## 2022-05-22 ENCOUNTER — Observation Stay (HOSPITAL_BASED_OUTPATIENT_CLINIC_OR_DEPARTMENT_OTHER): Payer: Medicare Other

## 2022-05-22 DIAGNOSIS — I5032 Chronic diastolic (congestive) heart failure: Secondary | ICD-10-CM | POA: Diagnosis not present

## 2022-05-22 DIAGNOSIS — I130 Hypertensive heart and chronic kidney disease with heart failure and stage 1 through stage 4 chronic kidney disease, or unspecified chronic kidney disease: Secondary | ICD-10-CM | POA: Diagnosis not present

## 2022-05-22 DIAGNOSIS — N1832 Chronic kidney disease, stage 3b: Secondary | ICD-10-CM | POA: Diagnosis not present

## 2022-05-22 DIAGNOSIS — I951 Orthostatic hypotension: Secondary | ICD-10-CM

## 2022-05-22 DIAGNOSIS — J441 Chronic obstructive pulmonary disease with (acute) exacerbation: Secondary | ICD-10-CM | POA: Diagnosis not present

## 2022-05-22 DIAGNOSIS — Z8673 Personal history of transient ischemic attack (TIA), and cerebral infarction without residual deficits: Secondary | ICD-10-CM | POA: Diagnosis not present

## 2022-05-22 DIAGNOSIS — I2510 Atherosclerotic heart disease of native coronary artery without angina pectoris: Secondary | ICD-10-CM | POA: Diagnosis not present

## 2022-05-22 DIAGNOSIS — R079 Chest pain, unspecified: Secondary | ICD-10-CM | POA: Diagnosis not present

## 2022-05-22 DIAGNOSIS — E1122 Type 2 diabetes mellitus with diabetic chronic kidney disease: Secondary | ICD-10-CM | POA: Diagnosis not present

## 2022-05-22 DIAGNOSIS — N179 Acute kidney failure, unspecified: Secondary | ICD-10-CM | POA: Diagnosis not present

## 2022-05-22 DIAGNOSIS — R0789 Other chest pain: Secondary | ICD-10-CM | POA: Diagnosis not present

## 2022-05-22 DIAGNOSIS — Z87891 Personal history of nicotine dependence: Secondary | ICD-10-CM | POA: Diagnosis not present

## 2022-05-22 DIAGNOSIS — Z85118 Personal history of other malignant neoplasm of bronchus and lung: Secondary | ICD-10-CM | POA: Diagnosis not present

## 2022-05-22 LAB — CBC WITH DIFFERENTIAL/PLATELET
Abs Immature Granulocytes: 0.04 10*3/uL (ref 0.00–0.07)
Basophils Absolute: 0 10*3/uL (ref 0.0–0.1)
Basophils Relative: 0 %
Eosinophils Absolute: 0.2 10*3/uL (ref 0.0–0.5)
Eosinophils Relative: 3 %
HCT: 33.6 % — ABNORMAL LOW (ref 36.0–46.0)
Hemoglobin: 11.1 g/dL — ABNORMAL LOW (ref 12.0–15.0)
Immature Granulocytes: 1 %
Lymphocytes Relative: 25 %
Lymphs Abs: 1.7 10*3/uL (ref 0.7–4.0)
MCH: 29.7 pg (ref 26.0–34.0)
MCHC: 33 g/dL (ref 30.0–36.0)
MCV: 89.8 fL (ref 80.0–100.0)
Monocytes Absolute: 0.6 10*3/uL (ref 0.1–1.0)
Monocytes Relative: 10 %
Neutro Abs: 4 10*3/uL (ref 1.7–7.7)
Neutrophils Relative %: 61 %
Platelets: 178 10*3/uL (ref 150–400)
RBC: 3.74 MIL/uL — ABNORMAL LOW (ref 3.87–5.11)
RDW: 14.2 % (ref 11.5–15.5)
WBC: 6.6 10*3/uL (ref 4.0–10.5)
nRBC: 0 % (ref 0.0–0.2)

## 2022-05-22 LAB — ECHOCARDIOGRAM COMPLETE
Area-P 1/2: 4.17 cm2
Height: 60 in
S' Lateral: 2.7 cm
Weight: 2307.2 oz

## 2022-05-22 LAB — COMPREHENSIVE METABOLIC PANEL
ALT: 15 U/L (ref 0–44)
AST: 18 U/L (ref 15–41)
Albumin: 3.1 g/dL — ABNORMAL LOW (ref 3.5–5.0)
Alkaline Phosphatase: 59 U/L (ref 38–126)
Anion gap: 5 (ref 5–15)
BUN: 15 mg/dL (ref 8–23)
CO2: 27 mmol/L (ref 22–32)
Calcium: 8 mg/dL — ABNORMAL LOW (ref 8.9–10.3)
Chloride: 104 mmol/L (ref 98–111)
Creatinine, Ser: 1.07 mg/dL — ABNORMAL HIGH (ref 0.44–1.00)
GFR, Estimated: 55 mL/min — ABNORMAL LOW (ref >60)
Glucose, Bld: 82 mg/dL (ref 70–99)
Potassium: 4.3 mmol/L (ref 3.5–5.1)
Sodium: 136 mmol/L (ref 135–145)
Total Bilirubin: 0.4 mg/dL (ref 0.3–1.2)
Total Protein: 5.7 g/dL — ABNORMAL LOW (ref 6.5–8.1)

## 2022-05-22 LAB — GLUCOSE, CAPILLARY
Glucose-Capillary: 85 mg/dL (ref 70–99)
Glucose-Capillary: 166 mg/dL — ABNORMAL HIGH (ref 70–99)
Glucose-Capillary: 178 mg/dL — ABNORMAL HIGH (ref 70–99)
Glucose-Capillary: 108 mg/dL — ABNORMAL HIGH (ref 70–99)

## 2022-05-22 LAB — PHOSPHORUS: Phosphorus: 2.9 mg/dL (ref 2.5–4.6)

## 2022-05-22 LAB — MAGNESIUM: Magnesium: 2 mg/dL (ref 1.7–2.4)

## 2022-05-22 MED ORDER — GUAIFENESIN-DM 100-10 MG/5ML PO SYRP
5.0000 mL | ORAL_SOLUTION | ORAL | Status: DC | PRN
Start: 2022-05-22 — End: 2022-05-23
  Administered 2022-05-22: 5 mL via ORAL
  Filled 2022-05-22: qty 5

## 2022-05-22 MED ORDER — SODIUM CHLORIDE 0.9 % IV SOLN: INTRAVENOUS | Status: DC

## 2022-05-22 MED ORDER — PERFLUTREN LIPID MICROSPHERE
1.0000 mL | INTRAVENOUS | Status: AC | PRN
  Administered 2022-05-22: 3 mL via INTRAVENOUS

## 2022-05-22 NOTE — Progress Notes (Signed)
PROGRESS NOTE  [name]  GHW:[id number] DOB: [birth year] DOA: 05/21/2022 PCP: [name], MD   Brief Narrative:  Patient is a 76 year old female with history of type 2 diabetes, hypertension, hyperlipidemia, CKD stage IIIb, COPD, history of right lung cancer treated with radiation, currently in remission who presented with chest pain.  On presentation she was hemodynamically stable.  Orthostatic vitals were positive.  Patient was started on IV fluids.  Her chest pain has resolved during this hospitalization.  Troponins have been negative.  Echo is pending.  On evaluation by OT this morning.  Being continued on IV fluids.  Assessment & Plan:  Principal Problem:   Chest pain Active Problems:   Hyperlipidemia   Essential hypertension   Chronic diastolic (congestive) heart failure (HCC)   Orthostatic hypotension   Chest pain: Presented with atypical chest pain.  Currently chest pain-free.  EKG just showed old LBBB.  Troponins have been negative.  2D echo pending.  Monitor  Orthostatic hypotension: She was orthostatic on presentation.  Seen by OT today, still orthostatic, symptomatic with unsteady gait.  We will continue IV fluids.As per report ,she was unsteady and high risk of falls.  Type 2 diabetes: Recent hemoglobin A1c of 6.4.  Takes insulin at home.  Currently on sliding cell insulin.  Monitor blood sugars  History of anxiety/depression: On Lexapro.  GERD: On Protonix  Hyperlipidemia: On Lipitor  Prolonged QTc: QTc of 529.  Avoid QT prolonging medications.  Check EKG tomorrow.  History of lung cancer: Treated with radiation.  Currently in remission.  CT imaging showed stable appearance of right upper lobe paramediastinal opacities and right apical pleural thickening  Generalized weakness: PT/OT consulted.  Recommending home health        DVT prophylaxis:enoxaparin (LOVENOX) injection 40 mg Start: 05/21/22 2200     Code Status: Full Code  Family  Communication: Called and discussed with daughter on phone on 7/30  Patient status:Obs  Patient is from :Home  Anticipated discharge VE:LFYB  Estimated DC date:tomorrow if orthostatic hypotension resolves   Consultants: None  Procedures: None  Antimicrobials:  Anti-infectives (From admission, onward)    None       Subjective: Patient seen and examined at the bedside this morning.  Hemodynamically stable during my evaluation.  Sitting at the edge of the bed.  Very comfortable, denies any chest pain and states I am going home today.  We discussed about the importance of echocardiogram and PT/OT evaluation  Objective: Vitals:   05/21/22 2030 05/21/22 2145 05/21/22 2300 05/22/22 0503  BP: 115/75 131/61 (!) 131/47 (!) 113/48  Pulse: 76 76 69 73  Resp: 17 18 18 18   Temp:   97.8 F (36.6 C) 97.8 F (36.6 C)  TempSrc:   Oral Oral  SpO2: 98% 99% 97%   Weight:   65.4 kg   Height:   5' (1.524 m)     Intake/Output Summary (Last 24 hours) at 05/22/2022 1141 Last data filed at 05/21/2022 1942 Gross per 24 hour  Intake 1000 ml  Output --  Net 1000 ml   Filed Weights   05/21/22 1550 05/21/22 2300  Weight: 64.5 kg 65.4 kg    Examination:  General exam: Overall comfortable, not in distress, pleasant female HEENT: PERRL Respiratory system:  no wheezes or crackles  Cardiovascular system: S1 & S2 heard, RRR.  Gastrointestinal system: Abdomen is nondistended, soft and nontender. Central nervous system: Alert and oriented Extremities: No edema, no clubbing ,no cyanosis Skin: No rashes, no  ulcers,no icterus     Data Reviewed: I have personally reviewed following labs and imaging studies  CBC: Recent Labs  Lab 05/21/22 1652 05/22/22 0256  WBC 7.3 6.6  NEUTROABS  --  4.0  HGB 11.2* 11.1*  HCT 34.4* 33.6*  MCV 91.0 89.8  PLT 199 643   Basic Metabolic Panel: Recent Labs  Lab 05/21/22 1652 05/21/22 1941 05/22/22 0256  NA 138  --  136  K 4.4  --  4.3  CL 107  --   104  CO2 26  --  27  GLUCOSE 127*  --  82  BUN 20  --  15  CREATININE 1.09* 1.17* 1.07*  CALCIUM 8.1*  --  8.0*  MG  --   --  2.0  PHOS  --   --  2.9     No results found for this or any previous visit (from the past 240 hour(s)).   Radiology Studies: CT Angio Chest PE W and/or Wo Contrast  Result Date: 05/21/2022 CLINICAL DATA:  Chest pain. EXAM: CT ANGIOGRAPHY CHEST WITH CONTRAST TECHNIQUE: Multidetector CT imaging of the chest was performed using the standard protocol during bolus administration of intravenous contrast. Multiplanar CT image reconstructions and MIPs were obtained to evaluate the vascular anatomy. RADIATION DOSE REDUCTION: This exam was performed according to the departmental dose-optimization program which includes automated exposure control, adjustment of the mA and/or kV according to patient size and/or use of iterative reconstruction technique. CONTRAST:  25mL OMNIPAQUE IOHEXOL 350 MG/ML SOLN COMPARISON:  CT chest 09/08/2021 FINDINGS: Cardiovascular: Satisfactory opacification of the pulmonary arteries to the segmental level. No evidence of pulmonary embolism. Normal heart size. Small pericardial effusion is unchanged. There are atherosclerotic calcifications of the aorta. Mediastinum/Nodes: Coarse calcification is again seen in the right thyroid gland. Esophagus appears nondilated. There is prominence of right hilar soft tissue similar to the prior study without discrete measurable lymph node. Lungs/Pleura: Pleural-parenchymal opacity with retraction and air bronchograms in the right upper lobe persists abutting the right hilum and upper right mediastinum. Right apical pleural thickening is unchanged. Overall dimensions have not significantly changed, but there is some increased narrowing of the right mainstem bronchus when compared to the prior exam. There is a trace layering right pleural effusion which is new. 12 mm ground-glass nodular density in the left lung apex is  unchanged. No new focal lung infiltrate. Upper Abdomen: No acute findings. Cholecystectomy clips are present. Musculoskeletal: Degenerative changes affect the spine Review of the MIP images confirms the above findings. IMPRESSION: 1. No evidence for pulmonary embolism. 2. Stable appearance of right upper lobe/paramediastinal opacities and right apical pleural thickening. There has been mild progression of narrowing of the right mainstem bronchus which may represent progressive scarring or malignancy. 3. New trace right pleural effusion. 4. Stable small pericardial effusion. 5. 12 mm left upper lobe ground-glass nodule is unchanged. Initial follow-up with CT at 6 months is recommended to confirm persistence. If persistent, repeat CT is recommended every 2 years until 5 years of stability has been established. This recommendation follows the consensus statement: Guidelines for Management of Incidental Pulmonary Nodules Detected on CT Images: From the Fleischner Society 2017; Radiology 2017; 284:228-243. Aortic Atherosclerosis (ICD10-I70.0). Electronically Signed   By: Ronney Asters M.D.   On: 05/21/2022 19:59   DG Chest Port 1 View  Result Date: 05/21/2022 CLINICAL DATA:  Chest pain. EXAM: PORTABLE CHEST 1 VIEW COMPARISON:  Chest x-ray 03/06/2022.  Chest CT 09/08/2021. FINDINGS: Chronic parenchymal consolidation in the  medial right upper lobe appear similar to the prior examinations. There is no new focal lung infiltrate. There is no pleural effusion or pneumothorax. The cardiomediastinal silhouette is within normal limits. There are surgical clips in the right abdomen. No acute fractures are seen. IMPRESSION: 1. No acute abnormality. 2. Stable chronic parenchymal opacities in the right upper lobe. Electronically Signed   By: Ronney Asters M.D.   On: 05/21/2022 17:00    Scheduled Meds:  aspirin EC  81 mg Oral Daily   atorvastatin  40 mg Oral Daily   enoxaparin (LOVENOX) injection  40 mg Subcutaneous Q24H    escitalopram  20 mg Oral Daily   insulin aspart  0-5 Units Subcutaneous QHS   insulin aspart  0-9 Units Subcutaneous TID WC   multivitamin with minerals   Oral Daily   pantoprazole  40 mg Oral Daily   Continuous Infusions:  sodium chloride       LOS: 0 days   Shelly Coss, MD [facility] P7/30/2023, 11:41 AM

## 2022-05-22 NOTE — Progress Notes (Signed)
Mobility Specialist: Progress Note   05/22/22 1602  Mobility  Activity Ambulated with assistance in room  Level of Assistance Moderate assist, patient does 50-74%  Assistive Device Other (Comment) (HHA)  Distance Ambulated (ft) 60 ft  Activity Response Tolerated fair  $Mobility charge 1 Mobility   Post-Mobility: 96 HR, 100% SpO2  Pt received in the bed and agreeable to mobility. Pt experienced posterior LOB x4 during session requiring modA to correct. Pt states she doesn't feel dizzy or light headed during [place]. Recommend RW next session. Pt returned to bed after session with call bell and phone at her side. Bed alarm is on.   [facility] Rajean Desantiago Mobility Specialist Mobility Specialist [address]: [phone]

## 2022-05-22 NOTE — Progress Notes (Signed)
Echocardiogram 2D Echocardiogram has been performed.  Oneal Deputy Awais Cobarrubias RDCS 05/22/2022, 11:59 AM

## 2022-05-22 NOTE — Evaluation (Signed)
Occupational Therapy Evaluation Patient Details Name: [name] MRN: [id number] DOB: 1946-03-05 Today's Date: 05/22/2022   History of Present Illness [name] is a 76 y.o. female who called EMS for chest pain, she was orthostatic in EMS and in ED. PMHx: DM, COPD, CAD, HLD, HTN, lung cancer, GERD, glaucoma, CVA, ckd, and diverticulitis.   Clinical Impression   [name] was evaluated s/p the above admission list, she reports that she requires some help with transfers from family at baseline and sometimes ambulates with a RA. Pt also endorsed "at least" 10 falls in the last 3 months and attributes her falls to getting "dizzy." She lives with her daughter and grandson. Upon evaluation pt required min A for bed mobility and transfers and up to mod A for LB ADLs for safety. Pt did have asymptomatic orthostatic vitals signs listed below. She will benefit from OT acutely. Recommend d/c home with 24/7 direct assist and [place].   Supine 114/52 (69) Sitting 124/60 (72) *Standing 92/49 (64)    Recommendations for follow up therapy are one component of a multi-disciplinary discharge planning process, led by the attending physician.  Recommendations may be updated based on patient status, additional functional criteria and insurance authorization.   Follow Up Recommendations  Home health OT    Assistance Recommended at Discharge Frequent or constant Supervision/Assistance  Patient can return home with the following A lot of help with bathing/dressing/bathroom;A little help with walking and/or transfers;Assistance with cooking/housework;Assist for transportation;Help with stairs or ramp for entrance    Functional Status Assessment  Patient has had a recent decline in their functional status and demonstrates the ability to make significant improvements in function in a reasonable and predictable amount of time.  Equipment Recommendations  None recommended by OT    Recommendations for  Other Services       Precautions / Restrictions Precautions Precautions: Fall Precaution Comments: orthostatic Restrictions Weight Bearing Restrictions: No      Mobility Bed Mobility Overal bed mobility: Needs Assistance Bed Mobility: Supine to Sit     Supine to sit: Mod assist     General bed mobility comments: mod A to elevate trunk and scot hips to EOB    Transfers Overall transfer level: Needs assistance Equipment used: Rolling walker (2 wheels) Transfers: Sit to/from Stand Sit to Stand: Min assist           General transfer comment: cues for hand placement      Balance Overall balance assessment: Needs assistance, History of Falls Sitting-balance support: Feet supported Sitting balance-Leahy Scale: Good Sitting balance - Comments: no UE supported   Standing balance support: Bilateral upper extremity supported, During functional activity Standing balance-Leahy Scale: Poor                             ADL either performed or assessed with clinical judgement   ADL Overall ADL's : Needs assistance/impaired Eating/Feeding: Supervision/ safety;Sitting   Grooming: Set up;Sitting   Upper Body Bathing: Supervision/ safety;Sitting   Lower Body Bathing: Moderate assistance;Sit to/from stand   Upper Body Dressing : Supervision/safety;Sitting   Lower Body Dressing: Moderate assistance;Sit to/from stand   Toilet Transfer: Minimal assistance;Rolling walker (2 wheels)   Toileting- Clothing Manipulation and Hygiene: Maximal assistance;Sit to/from stand       Functional mobility during ADLs: Minimal assistance;Rolling walker (2 wheels) General ADL Comments: limited by orthostatic BP, min A to boost from EOB. Once standing she is min G  with RW. Mod A for LB ADLs for safety and general weakness     Vision Baseline Vision/History: 0 No visual deficits Vision Assessment?: No apparent visual deficits     Perception     Praxis      Pertinent  Vitals/Pain Pain Assessment Pain Assessment: No/denies pain     Hand Dominance Right   Extremity/Trunk Assessment Upper Extremity Assessment Upper Extremity Assessment: Generalized weakness   Lower Extremity Assessment Lower Extremity Assessment: Defer to PT evaluation   Cervical / Trunk Assessment Cervical / Trunk Assessment: Kyphotic   Communication Communication Communication: HOH   Cognition Arousal/Alertness: Awake/alert Behavior During Therapy: WFL for tasks assessed/performed Overall Cognitive Status: No family/caregiver present to determine baseline cognitive functioning                                 General Comments: Overall A&Ox4. Limited insight to deficits/symptoms with poor safety awareness and problem solving.     General Comments  orthostatic upon standing, asymptomatic    Exercises     Shoulder Instructions      Home Living Family/patient expects to be discharged to:: Private residence Living Arrangements: Children Available Help at Discharge: Family;Friend(s);Available 24 hours/day Type of Home: House Home Access: Stairs to enter [employer] of Steps: 4-5 Entrance Stairs-Rails: Right Home Layout: One level     Bathroom Shower/Tub: Teacher, early years/pre: Standard     Home Equipment: Conservation officer, nature (2 wheels);BSC/3in1;Shower seat   Additional Comments: lives with daughter who works and 72 yo grandson who does not work      Prior Functioning/Environment Prior Level of Function : Needs assist             Mobility Comments: pt states family assists wtih getting OOB and ambulating to the toilet. She "sometimes" walks with a walker. Endorses "at least" 10 falls in the last month and attributes her falls to getting dizzy. ADLs Comments: Family assist with toilet transfers. Otherwise pt states she is indep, no family present to confirm        OT Problem List: Decreased activity tolerance;Decreased  safety awareness;Decreased knowledge of precautions      OT Treatment/Interventions: Self-care/ADL training;Therapeutic exercise;DME and/or AE instruction;Therapeutic activities;Patient/family education;Balance training    OT Goals(Current goals can be found in the care plan section) Acute Rehab OT Goals Patient Stated Goal: home OT Goal Formulation: With patient Time For Goal Achievement: 06/05/22 Potential to Achieve Goals: Good ADL Goals Pt Will Perform Lower Body Dressing: with modified independence;sit to/from stand Pt Will Transfer to Toilet: with modified independence;ambulating Additional ADL Goal #1: pt will demonstrate increased activity tolerance to complete at least 5 minutes of OOB activity with superivsion A  OT Frequency: Min 2X/week    Co-evaluation              AM-PAC OT "6 Clicks" Daily Activity     Outcome Measure Help from another person eating meals?: None Help from another person taking care of personal grooming?: A Little Help from another person toileting, which includes using toliet, bedpan, or urinal?: A Lot Help from another person bathing (including washing, rinsing, drying)?: A Lot Help from another person to put on and taking off regular upper body clothing?: A Little Help from another person to put on and taking off regular lower body clothing?: A Lot 6 Click Score: 16   End of Session Equipment Utilized During Treatment: Gait belt;Rolling walker (2  wheels) Nurse Communication: Mobility status (orthostatic BP)  Activity Tolerance: Patient tolerated treatment well Patient left: in bed;Other (comment) (direct hand off to PT)  OT Visit Diagnosis: Other abnormalities of gait and mobility (R26.89);Muscle weakness (generalized) (M62.81)                Time: 0962-8366 OT Time Calculation (min): 17 min Charges:  OT General Charges $OT Visit: 1 Visit OT Evaluation $OT Eval Moderate Complexity: 1 Mod    Trajon Rosete A Moussa Wiegand 05/22/2022, 9:36 AM

## 2022-05-22 NOTE — Care Management Obs Status (Signed)
[facility] NOTIFICATION   Patient Details  Name: [name] MRN: [id number] Date of Birth: [birth year]   Medicare Observation Status Notification Given:  Yes    Yates Weisgerber G., RN 05/22/2022, 10:06 AM

## 2022-05-22 NOTE — Evaluation (Signed)
Physical Therapy Evaluation Patient Details Name: [name] MRN: [id number] DOB: 1946-05-17 Today's Date: 05/22/2022  History of Present Illness  [name] is a 76 y.o. female who called EMS for chest pain, she was orthostatic in EMS and in ED. PMHx: DM, COPD, CAD, HLD, HTN, lung cancer, GERD, glaucoma, CVA, ckd, and diverticulitis.  Clinical Impression  Pt presents with impairments in strength, balance, and activity tolerance. Pt tolerates therapy well today, ambulating in her room to her chair. Pt received from OT in asymptomatic orthostasis, however BP recovers and remains stable throughout remainder of session (BP values in general comments). Pt reports a significant fall hx, and most have occurred when her grandson isn't guarding behind her to account for her posterior lean tendency. Pt also reports that her falls occur with dizziness and sometimes feels that she blacks out; PT educates strategies for mobilizing safely to avoid orthostatic hypotension. Continued therapy will assist the pt in building strength, balance, and activity tolerance for progression of safe mobility and functional independence.     Recommendations for follow up therapy are one component of a multi-disciplinary discharge planning process, led by the attending physician.  Recommendations may be updated based on patient status, additional functional criteria and insurance authorization.  Follow Up Recommendations Home health PT      Assistance Recommended at Discharge Frequent or constant Supervision/Assistance  Patient can return home with the following  A little help with walking and/or transfers;A little help with bathing/dressing/bathroom;Assistance with cooking/housework;Assist for transportation;Help with stairs or ramp for entrance    Equipment Recommendations None recommended by PT  Recommendations for Other Services       Functional Status Assessment Patient has had a recent decline in  their functional status and demonstrates the ability to make significant improvements in function in a reasonable and predictable amount of time.     Precautions / Restrictions Precautions Precautions: Fall Precaution Comments: orthostatic Restrictions Weight Bearing Restrictions: No      Mobility  Bed Mobility Overal bed mobility: Needs Assistance Bed Mobility: Supine to Sit     Supine to sit: Mod assist     General bed mobility comments: Mod A for trunk elevation    Transfers Overall transfer level: Needs assistance Equipment used: Rolling walker (2 wheels) Transfers: Sit to/from Stand Sit to Stand: Min guard           General transfer comment: cues for hand placement; lacks eccentric control going from standing to sitting.    Ambulation/Gait Ambulation/Gait assistance: Min guard Gait Distance (Feet): 10 Feet (Around bed to chair) Assistive device: Rolling walker (2 wheels) Gait Pattern/deviations: Step-through pattern, Decreased step length - right, Decreased step length - left, Decreased stride length Gait velocity: decreased Gait velocity interpretation: <1.31 ft/sec, indicative of household ambulator   General Gait Details: Pt reports that she tends to lean posteriorly while walking, but not problematic in today's session.  Stairs            Wheelchair Mobility    Modified Rankin (Stroke Patients Only)       Balance Overall balance assessment: Needs assistance, History of Falls Sitting-balance support: Feet supported, No upper extremity supported Sitting balance-Leahy Scale: Good     Standing balance support: Bilateral upper extremity supported, During functional activity, Reliant on assistive device for balance Standing balance-Leahy Scale: Poor  Pertinent Vitals/Pain Pain Assessment Pain Assessment: No/denies pain    Home Living Family/patient expects to be discharged to:: Private  residence Living Arrangements: Children;Other relatives (Grandson) Available Help at Discharge: Family;Friend(s);Available 24 hours/day Type of Home: House Home Access: Stairs to enter Entrance Stairs-Rails: Right Entrance Stairs-Number of Steps: 4-5   Home Layout: One level Home Equipment: Conservation officer, nature (2 wheels);BSC/3in1;Shower seat Additional Comments: lives with daughter who works and 5 yo grandson who does not work    Prior Function Prior Level of Function : Needs assist             Mobility Comments: pt states family assists wtih getting OOB and ambulating to the toilet. She "sometimes" walks with a walker. Endorses "at least" 10 falls in the last month and attributes her falls to getting dizzy. Also reports tendency for posterior lean during ambulation - grandson walks behind her for safety ADLs Comments: Family assist with toilet transfers. Otherwise pt states she is indep, no family present to confirm     Hand Dominance   Dominant Hand: Right    Extremity/Trunk Assessment   Upper Extremity Assessment Upper Extremity Assessment: Generalized weakness    Lower Extremity Assessment Lower Extremity Assessment: Generalized weakness    Cervical / Trunk Assessment Cervical / Trunk Assessment: Kyphotic  Communication   Communication: HOH  Cognition Arousal/Alertness: Awake/alert Behavior During Therapy: WFL for tasks assessed/performed Overall Cognitive Status: No family/caregiver present to determine baseline cognitive functioning                                 General Comments: Overall A&Ox4. Limited insight to deficits/symptoms with poor safety awareness and problem solving.        General Comments General comments (skin integrity, edema, etc.): Pt received from OT in asymptomatic orthostasis (92/49(64)); BP recovers to 119/48 (65) and is 104/85 prior to ambulation, post-ambulation BP is 115/54 (73)    Exercises     Assessment/Plan    PT  Assessment    PT Problem List         PT Treatment Interventions      PT Goals (Current goals can be found in the Care Plan section)  Acute Rehab PT Goals Patient Stated Goal: Return home PT Goal Formulation: With patient Time For Goal Achievement: 06/05/22 Potential to Achieve Goals: Good    Frequency       Co-evaluation               AM-PAC PT "6 Clicks" Mobility  Outcome Measure Help needed turning from your back to your side while in a flat bed without using bedrails?: A Lot Help needed moving from lying on your back to sitting on the side of a flat bed without using bedrails?: A Lot Help needed moving to and from a bed to a chair (including a wheelchair)?: A Little Help needed standing up from a chair using your arms (e.g., wheelchair or bedside chair)?: A Little Help needed to walk in hospital room?: A Little Help needed climbing 3-5 steps with a railing? : A Lot 6 Click Score: 15    End of Session              Time: 5427-0623 PT Time Calculation (min) (ACUTE ONLY): 34 min   Charges:   PT Evaluation $PT Eval Low Complexity: 1 Low          Hall Busing, SPT Acute Rehabilitation Office #: [phone]  Hall Busing 05/22/2022, 12:34 PM

## 2022-05-23 ENCOUNTER — Observation Stay (HOSPITAL_COMMUNITY): Payer: Medicare Other

## 2022-05-23 DIAGNOSIS — N1832 Chronic kidney disease, stage 3b: Secondary | ICD-10-CM | POA: Diagnosis not present

## 2022-05-23 DIAGNOSIS — Z8673 Personal history of transient ischemic attack (TIA), and cerebral infarction without residual deficits: Secondary | ICD-10-CM | POA: Diagnosis not present

## 2022-05-23 DIAGNOSIS — R079 Chest pain, unspecified: Secondary | ICD-10-CM | POA: Diagnosis not present

## 2022-05-23 DIAGNOSIS — Z87891 Personal history of nicotine dependence: Secondary | ICD-10-CM | POA: Diagnosis not present

## 2022-05-23 DIAGNOSIS — N179 Acute kidney failure, unspecified: Secondary | ICD-10-CM | POA: Diagnosis not present

## 2022-05-23 DIAGNOSIS — Z85118 Personal history of other malignant neoplasm of bronchus and lung: Secondary | ICD-10-CM | POA: Diagnosis not present

## 2022-05-23 DIAGNOSIS — I5032 Chronic diastolic (congestive) heart failure: Secondary | ICD-10-CM | POA: Diagnosis not present

## 2022-05-23 DIAGNOSIS — E1122 Type 2 diabetes mellitus with diabetic chronic kidney disease: Secondary | ICD-10-CM | POA: Diagnosis not present

## 2022-05-23 DIAGNOSIS — J479 Bronchiectasis, uncomplicated: Secondary | ICD-10-CM | POA: Diagnosis not present

## 2022-05-23 DIAGNOSIS — R0789 Other chest pain: Secondary | ICD-10-CM | POA: Diagnosis not present

## 2022-05-23 DIAGNOSIS — J441 Chronic obstructive pulmonary disease with (acute) exacerbation: Secondary | ICD-10-CM | POA: Diagnosis not present

## 2022-05-23 DIAGNOSIS — I951 Orthostatic hypotension: Secondary | ICD-10-CM | POA: Diagnosis not present

## 2022-05-23 DIAGNOSIS — R0602 Shortness of breath: Secondary | ICD-10-CM | POA: Diagnosis not present

## 2022-05-23 DIAGNOSIS — I130 Hypertensive heart and chronic kidney disease with heart failure and stage 1 through stage 4 chronic kidney disease, or unspecified chronic kidney disease: Secondary | ICD-10-CM | POA: Diagnosis not present

## 2022-05-23 DIAGNOSIS — I2510 Atherosclerotic heart disease of native coronary artery without angina pectoris: Secondary | ICD-10-CM | POA: Diagnosis not present

## 2022-05-23 LAB — GLUCOSE, CAPILLARY
Glucose-Capillary: 106 mg/dL — ABNORMAL HIGH (ref 70–99)
Glucose-Capillary: 200 mg/dL — ABNORMAL HIGH (ref 70–99)

## 2022-05-23 IMAGING — CR DG CHEST 2V
2 series · 2 of 2 positions shown · non-contrast
Comparison: Chest x-ray 05/28/2021 and multiple other prior
examinations.

CLINICAL DATA: 75-year-old female with history of cough and
shortness of breath. History of lung cancer.

EXAM:
CHEST - 2 VIEW

[w chest lat]
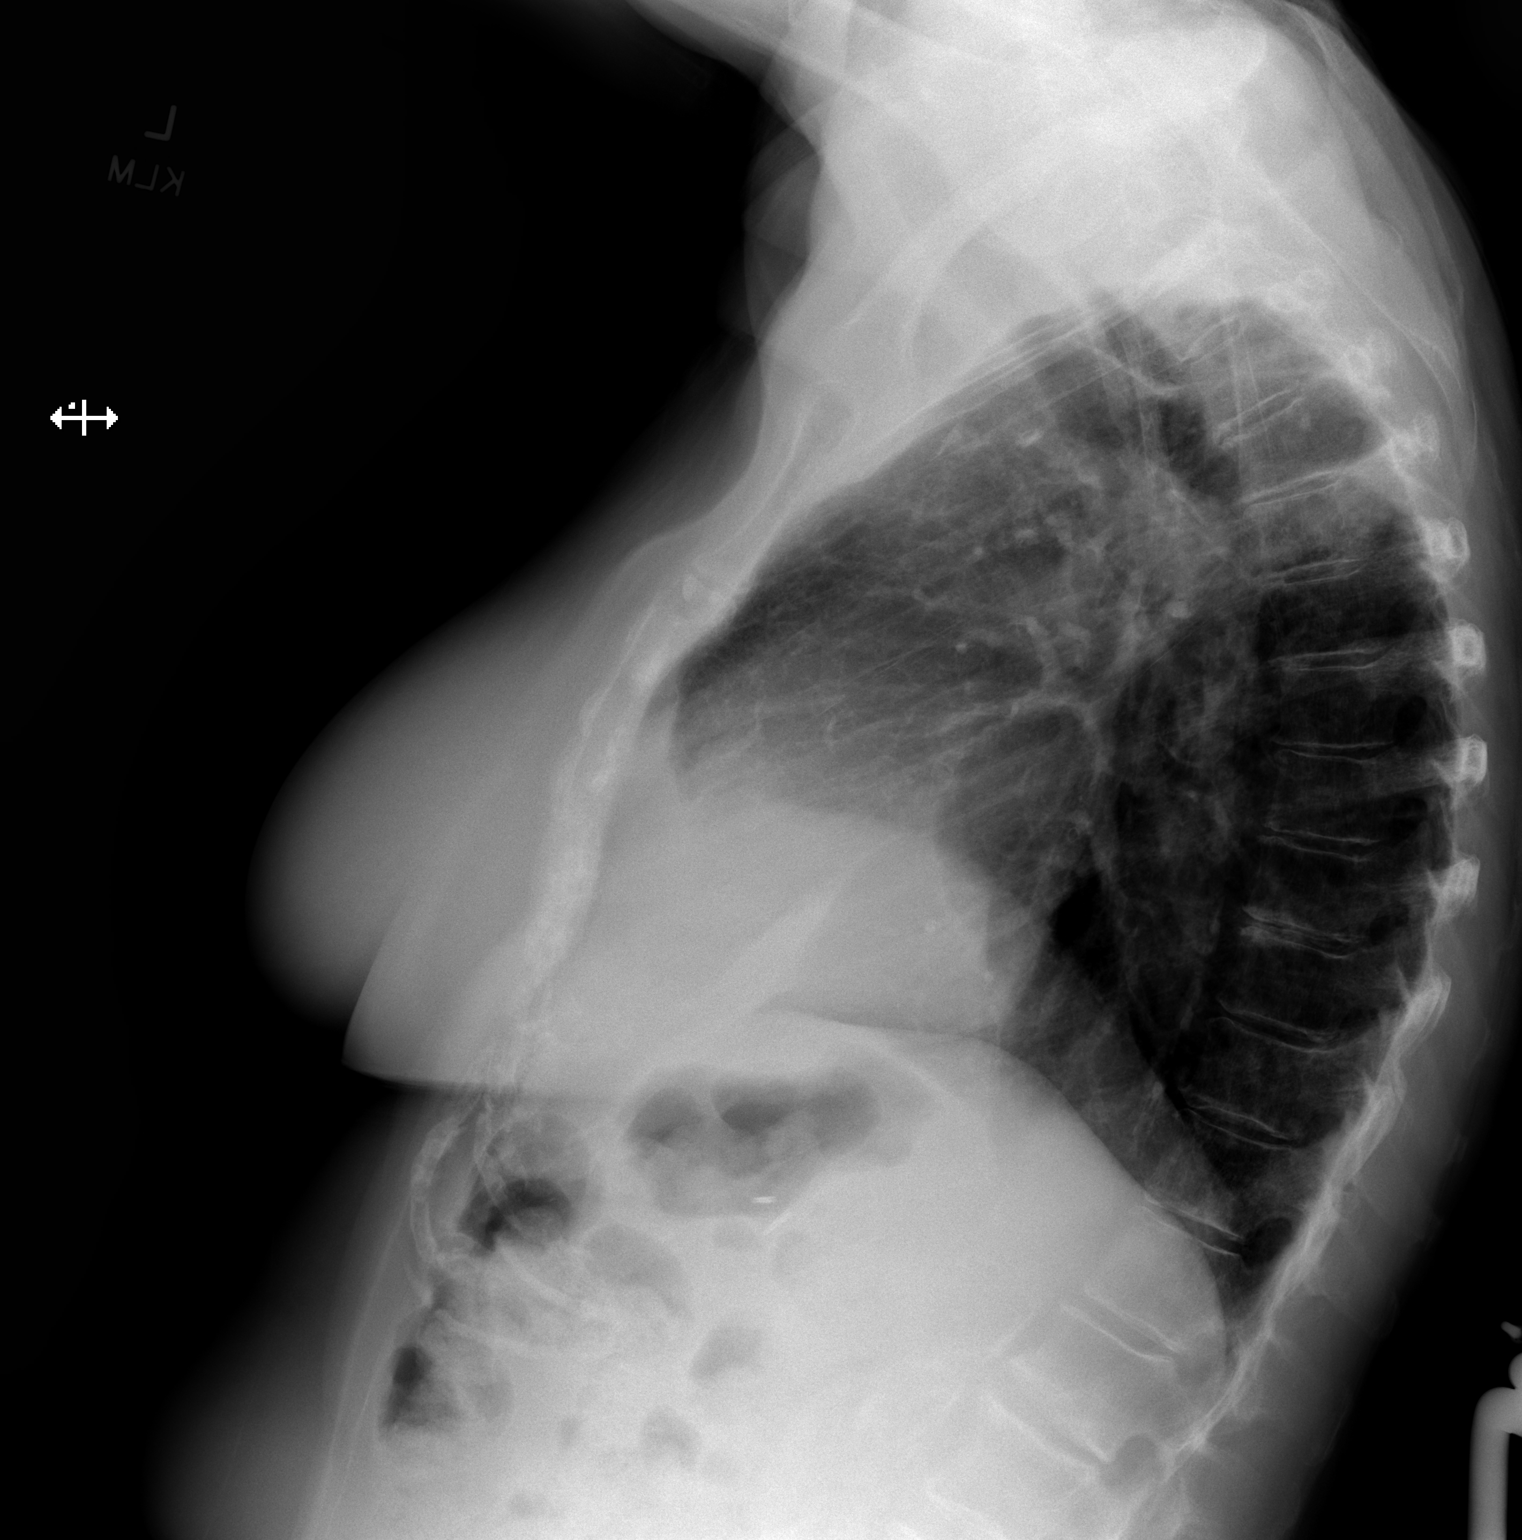

[w chest ap]
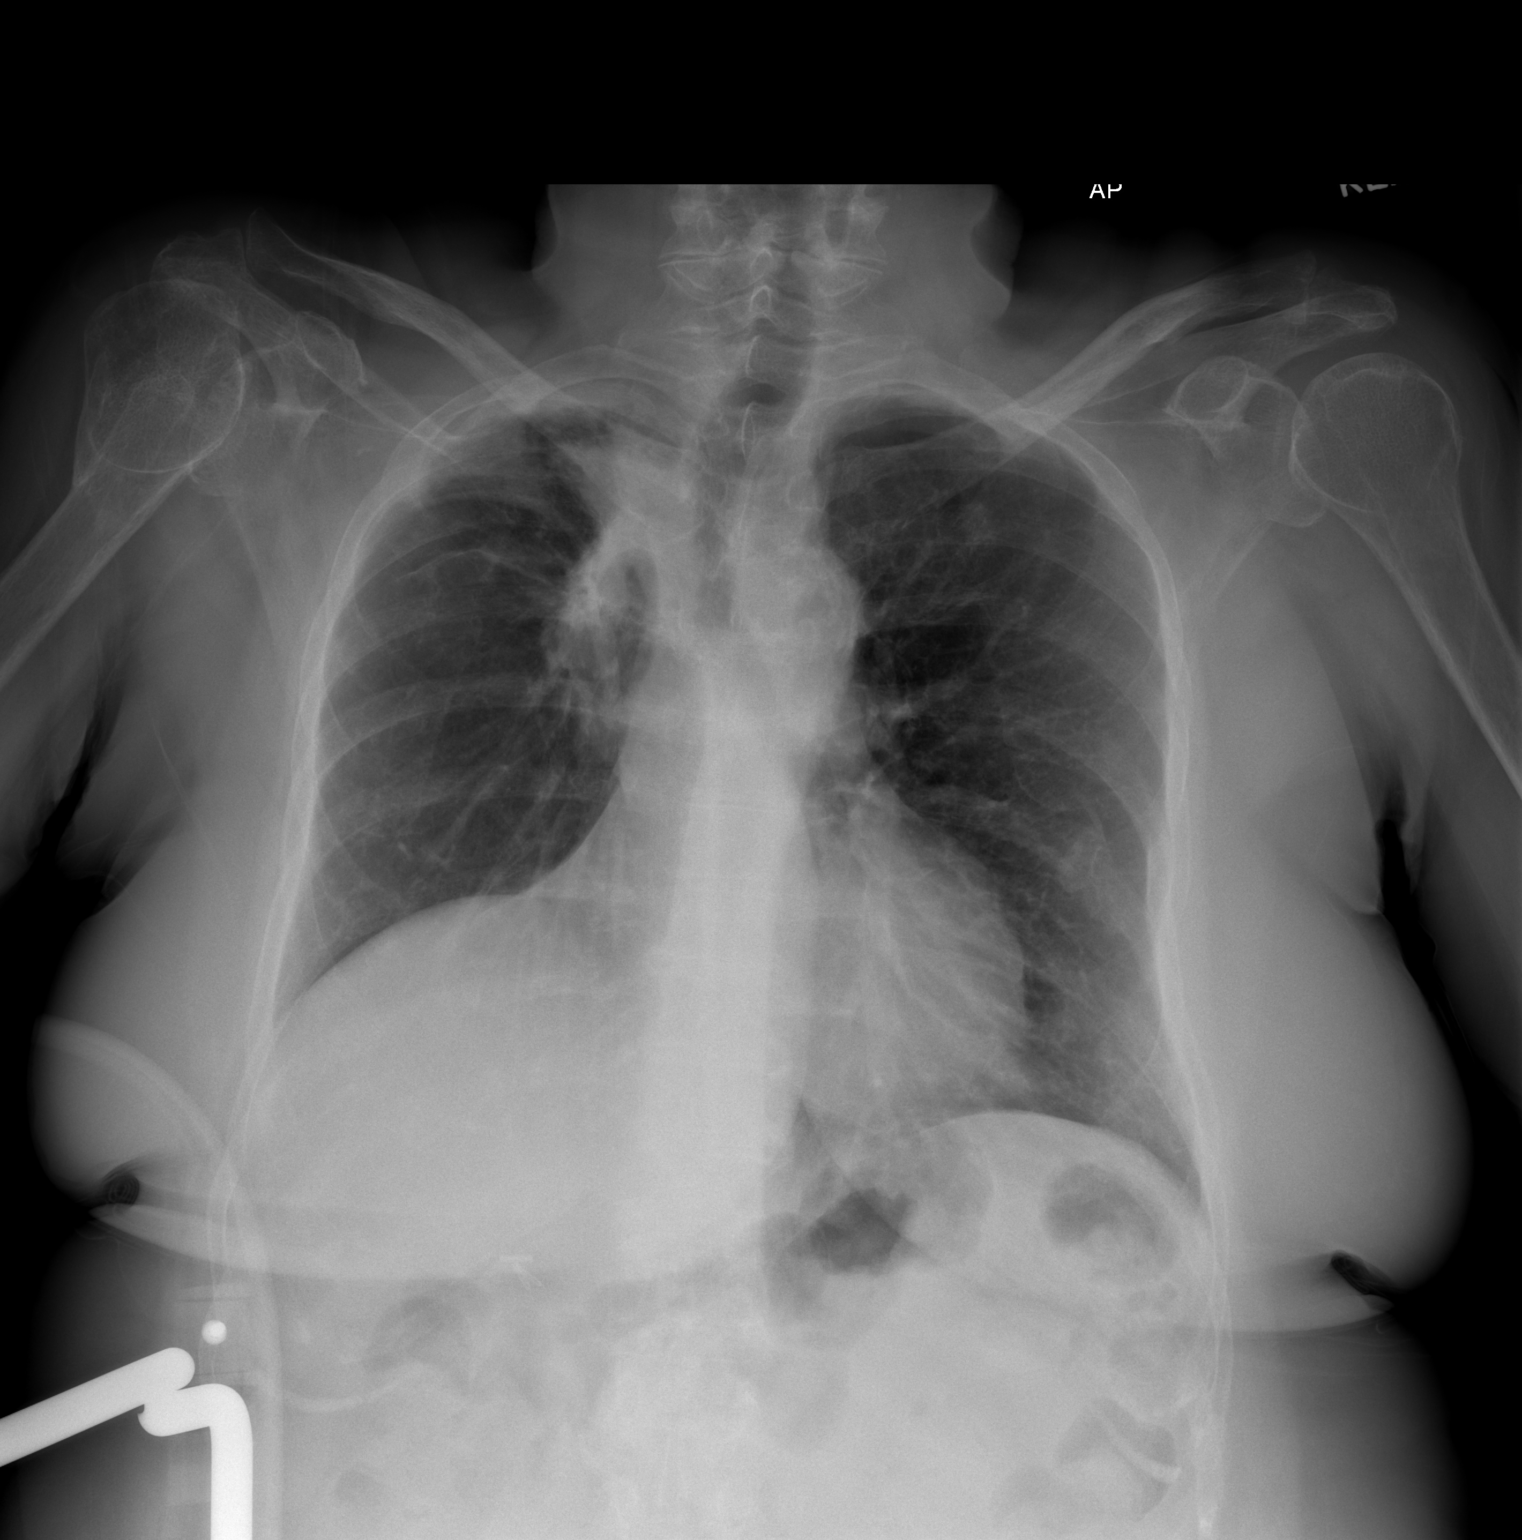

[2 of 2 positions shown; findings below may reference images not displayed]

FINDINGS: Subtle nodular density projecting over the apex of the left upper
lobe in an area that was previously obscured by an EKG lead on prior
study 05/28/2021, new compared to rim more remote prior study from
10/07/2019. Persistent masslike area of architectural distortion in
the apex of the right lung, similar to prior examination, most
compatible with areas of chronic postradiation masslike fibrosis.
Elevation of the right hemidiaphragm is unchanged. No acute
consolidative airspace disease. No pleural effusions. No
pneumothorax. No evidence of pulmonary edema. Heart size is normal.
Aortic atherosclerosis. Old healed left-sided posterolateral rib
fractures are incidentally noted.
IMPRESSION: 1. No radiographic evidence of acute cardiopulmonary disease.
2. However, there is a vague nodular density projecting over the
left upper lobe near the apex. Further evaluation with nonemergent
chest CT is strongly recommended in the near future to better
evaluate this finding and exclude the possibility of neoplasm.

These results will be called to the ordering clinician or
representative by the Radiologist Assistant, and communication
documented in the PACS or [REDACTED].

## 2022-05-23 MED ORDER — PREDNISONE 20 MG PO TABS: 40.0000 mg | ORAL_TABLET | Freq: Every day | ORAL | 0 refills | Status: DC

## 2022-05-23 NOTE — TOC Initial Note (Addendum)
Transition of Care [facility]) - Initial/Assessment Note    Patient Details  Name: [name] MRN: [id number] Date of Birth: [birth year]  [facility] CM/SW Contact:    Bethena Roys, RN Phone Number: 05/23/2022, 10:56 AM  Clinical Narrative:  Patient presented from home and plan will be to return home with home health services. Patient states she lives with her daughter and she is agreeable to home health services. Patient does not have an agency preference. Case Manager called [facility] to see if they can accept the patient for services. Awaiting to hear back from the Liaison.                 1314 05-23-22 Case Manager received confirmation that [facility] will be able to service the patient starting on tomorrow. No further needs identified at this time.   Expected Discharge Plan: [place] Barriers to Discharge: No Barriers Identified   Patient Goals and CMS Choice Patient states their goals for this hospitalization and ongoing recovery are:: to return home.   Choice offered to / list presented to : Patient  Expected Discharge Plan and Services Expected Discharge Plan: [place] In-house Referral: NA Discharge Planning Services: CM Consult Post Acute Care Choice: [place] arrangements for the past 2 months: Single Family Home Expected Discharge Date: 05/23/22               DME Arranged: N/A DME Agency: NA       HH Arranged: PT, OT HH Agency: [facility] ([place]) Date [place]: 05/23/22 Time [place]: 57 Representative spoke with at [place]: [place] Arrangements/Services Living arrangements for the past 2 months: Single Family Home Lives with:: Adult Children, Relatives Patient language and need for interpreter reviewed:: Yes Do you feel safe going back to the place where you live?: Yes      Need for Family Participation in Patient Care: Yes  (Comment) Care giver support system in place?: Yes (comment) Current home services: DME (patient has a rolling walker.) Criminal Activity/Legal Involvement Pertinent to Current Situation/Hospitalization: No - Comment as needed  Activities of Daily Living Home Assistive Devices/Equipment: Gilford Rile (specify type) ADL Screening (condition at time of admission) Patient's cognitive ability adequate to safely complete daily activities?: Yes Is the patient deaf or have difficulty hearing?: No Does the patient have difficulty seeing, even when wearing glasses/contacts?: No Does the patient have difficulty concentrating, remembering, or making decisions?: No Patient able to express need for assistance with ADLs?: No Does the patient have difficulty dressing or bathing?: No Independently performs ADLs?: Yes (appropriate for developmental age) Does the patient have difficulty walking or climbing stairs?: No Weakness of Legs: None Weakness of Arms/Hands: None  Permission Sought/Granted Permission sought to share information with : Facility Sport and exercise psychologist, Case Optician, dispensing granted to share information with : Yes, Verbal Permission Granted     Permission granted to share info w AGENCY: [place] ([facility])        Emotional Assessment Appearance:: Appears stated age Attitude/Demeanor/Rapport: Engaged Affect (typically observed): Appropriate Orientation: : Oriented to Situation, Oriented to Place, Oriented to Self, Oriented to  Time Alcohol / Substance Use: Not Applicable Psych Involvement: No (comment)  Admission diagnosis:  Chest pain [R07.9] Chest pain, unspecified type [R07.9] Patient Active Problem List   Diagnosis Date Noted   Orthostatic hypotension 05/22/2022   Chest pain 05/21/2022   Dehydration 10/19/2021   COVID-19 virus  infection 10/19/2021   Hyponatremia 10/19/2021   Dementia without behavioral disturbance ([place]) 10/19/2021   Pyuria 10/19/2021    Fall    Recurrent falls 10/08/2019   Hypocalcemia 10/08/2019   Preoperative cardiovascular examination 01/12/2017   Diarrhea 12/02/2016   Essential hypertension 12/01/2016   Chronic diastolic (congestive) heart failure ([place]) 12/01/2016   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. [place] [address] Phone: [phone] Fax: [place] [address] Phone: [phone] Fax: [phone]   Readmission Risk Interventions     No data to display

## 2022-05-23 NOTE — Plan of Care (Signed)
Problem: Education: Goal: Ability to describe self-care measures that may prevent or decrease complications (Diabetes Survival Skills Education) will improve 05/23/2022 1458 by Jerlyn Ly, RN Outcome: Adequate for Discharge 05/23/2022 1032 by Jerlyn Ly, RN Outcome: Adequate for Discharge 05/23/2022 1032 by Jerlyn Ly, RN Outcome: Progressing Goal: Individualized Educational Video(s) 05/23/2022 1458 by Jerlyn Ly, RN Outcome: Adequate for Discharge 05/23/2022 1032 by Jerlyn Ly, RN Outcome: Adequate for Discharge 05/23/2022 1032 by Jerlyn Ly, RN Outcome: Progressing   Problem: Coping: Goal: Ability to adjust to condition or change in health will improve 05/23/2022 1458 by Jerlyn Ly, RN Outcome: Adequate for Discharge 05/23/2022 1032 by Jerlyn Ly, RN Outcome: Adequate for Discharge 05/23/2022 1032 by Jerlyn Ly, RN Outcome: Progressing   Problem: Fluid Volume: Goal: Ability to maintain a balanced intake and output will improve 05/23/2022 1458 by Jerlyn Ly, RN Outcome: Adequate for Discharge 05/23/2022 1032 by Jerlyn Ly, RN Outcome: Adequate for Discharge 05/23/2022 1032 by Jerlyn Ly, RN Outcome: Progressing   Problem: Health Behavior/Discharge Planning: Goal: Ability to identify and utilize available resources and services will improve 05/23/2022 1458 by Jerlyn Ly, RN Outcome: Adequate for Discharge 05/23/2022 1032 by Jerlyn Ly, RN Outcome: Adequate for Discharge 05/23/2022 1032 by Jerlyn Ly, RN Outcome: Progressing Goal: Ability to manage health-related needs will improve 05/23/2022 1458 by Jerlyn Ly, RN Outcome: Adequate for Discharge 05/23/2022 1032 by Jerlyn Ly, RN Outcome: Adequate for Discharge 05/23/2022 1032 by Jerlyn Ly, RN Outcome: Progressing   Problem: Metabolic: Goal: Ability to maintain appropriate glucose levels will improve 05/23/2022 1458 by Jerlyn Ly, RN Outcome: Adequate for  Discharge 05/23/2022 1032 by Jerlyn Ly, RN Outcome: Adequate for Discharge 05/23/2022 1032 by Jerlyn Ly, RN Outcome: Progressing   Problem: Nutritional: Goal: Maintenance of adequate nutrition will improve 05/23/2022 1458 by Jerlyn Ly, RN Outcome: Adequate for Discharge 05/23/2022 1032 by Jerlyn Ly, RN Outcome: Adequate for Discharge 05/23/2022 1032 by Jerlyn Ly, RN Outcome: Progressing Goal: Progress toward achieving an optimal weight will improve 05/23/2022 1458 by Jerlyn Ly, RN Outcome: Adequate for Discharge 05/23/2022 1032 by Jerlyn Ly, RN Outcome: Adequate for Discharge 05/23/2022 1032 by Jerlyn Ly, RN Outcome: Progressing   Problem: Skin Integrity: Goal: Risk for impaired skin integrity will decrease 05/23/2022 1458 by Jerlyn Ly, RN Outcome: Adequate for Discharge 05/23/2022 1032 by Jerlyn Ly, RN Outcome: Adequate for Discharge 05/23/2022 1032 by Jerlyn Ly, RN Outcome: Progressing   Problem: Tissue Perfusion: Goal: Adequacy of tissue perfusion will improve 05/23/2022 1458 by Jerlyn Ly, RN Outcome: Adequate for Discharge 05/23/2022 1032 by Jerlyn Ly, RN Outcome: Adequate for Discharge 05/23/2022 1032 by Jerlyn Ly, RN Outcome: Progressing   Problem: Education: Goal: Knowledge of General Education information will improve Description: Including pain rating scale, medication(s)/side effects and non-pharmacologic comfort measures 05/23/2022 1458 by Jerlyn Ly, RN Outcome: Adequate for Discharge 05/23/2022 1032 by Jerlyn Ly, RN Outcome: Adequate for Discharge 05/23/2022 1032 by Jerlyn Ly, RN Outcome: Progressing   Problem: Health Behavior/Discharge Planning: Goal: Ability to manage health-related needs will improve 05/23/2022 1458 by Jerlyn Ly, RN Outcome: Adequate for Discharge 05/23/2022 1032 by Jerlyn Ly, RN Outcome: Adequate for Discharge 05/23/2022 1032 by Jerlyn Ly, RN Outcome: Progressing    Problem: Clinical Measurements: Goal: Ability to maintain clinical measurements within normal limits will improve 05/23/2022 1458 by Jerlyn Ly, RN Outcome: Adequate for Discharge 05/23/2022 1032 by Jerlyn Ly, RN Outcome: Adequate for Discharge Goal: Will remain free from infection 05/23/2022 1458 by Jerlyn Ly, RN Outcome: Adequate for Discharge 05/23/2022 1032 by Jerlyn Ly,  RN Goal: Diagnostic test results will improve 05/23/2022 1458 by Jerlyn Ly, RN Outcome: Adequate for Discharge 05/23/2022 1032 by Jerlyn Ly, RN Goal: Respiratory complications will improve 05/23/2022 1458 by Jerlyn Ly, RN Outcome: Adequate for Discharge 05/23/2022 1032 by Jerlyn Ly, RN  Goal: Cardiovascular complication will be avoided 05/23/2022 1458 by Jerlyn Ly, RN Outcome: Adequate for Discharge 05/23/2022 1032 by Jerlyn Ly, RN   Problem: Activity: Goal: Risk for activity intolerance will decrease 05/23/2022 1458 by Jerlyn Ly, RN Outcome: Adequate for Discharge 05/23/2022 1032 by Jerlyn Ly, RN  Problem: Nutrition: Goal: Adequate nutrition will be maintained 05/23/2022 1458 by Jerlyn Ly, RN Outcome: Adequate for Discharge 05/23/2022 1032 by Jerlyn Ly, RN   Problem: Coping: Goal: Level of anxiety will decrease 05/23/2022 1458 by Jerlyn Ly, RN Outcome: Adequate for Discharge 05/23/2022 1032 by Jerlyn Ly, RN   Problem: Elimination: Goal: Will not experience complications related to bowel motility 05/23/2022 1458 by Jerlyn Ly, RN Outcome: Adequate for Discharge 05/23/2022 1032 by Jerlyn Ly, RN  Goal: Will not experience complications related to urinary retention 05/23/2022 1458 by Jerlyn Ly, RN Outcome: Adequate for Discharge 05/23/2022 1032 by Jerlyn Ly, RN   Problem: Pain Managment: Goal: General experience of comfort will improve 05/23/2022 1458 by Jerlyn Ly, RN Outcome: Adequate for Discharge 05/23/2022 1032 by Jerlyn Ly, RN Outcome: Adequate for Discharge  Problem: Safety: Goal: Ability to remain free from injury will improve 05/23/2022 1458 by Jerlyn Ly, RN Outcome: Adequate for Discharge   Problem: Skin Integrity: Goal: Risk for impaired skin integrity will decrease 05/23/2022 1458 by Jerlyn Ly, RN

## 2022-05-23 NOTE — Discharge Summary (Signed)
Physician Discharge Summary  [name] GBT:[id number] DOB: 04-06-1946 DOA: 05/21/2022  PCP: [name], MD  Admit date: 05/21/2022 Discharge date: 05/23/2022  Admitted From: Home Disposition:  Home  Discharge Condition:Stable CODE STATUS:FULL Diet recommendation: Heart Healthy  Brief/Interim Summary:  Patient is a 76 year old female with history of type 2 diabetes, hypertension, hyperlipidemia, CKD stage IIIb, COPD, history of right lung cancer treated with radiation, currently in remission who presented with chest pain.  On presentation she was hemodynamically stable.  Orthostatic vitals were positive.  Patient was started on IV fluids.  Her chest pain has resolved during this hospitalization.  Troponins have been negative.  Echo showed normal ejection fraction, no wall motion abnormality.  She was seen by PT/OT and was found to be orthostatic, treated with IV fluids.  PT/OT recommended home health.  Medically stable for discharge today.    Following problems were addressed during her hospitalization:    Chest pain: Presented with atypical chest pain.  Currently chest pain-free.  EKG just showed old LBBB.  Troponins have been negative.  2D echo showed EF of 60 to 65%, no wall motion abnormality.  Heart valves not well visualized.   Orthostatic hypotension: She was orthostatic on presentation.  Seen by OT, found to be orthostatic, symptomatic with unsteady gait.  She was started on IV fluids.  Orthostatics checked this morning were negative.  We will discontinue metoprolol.   Type 2 diabetes: Recent hemoglobin A1c of 6.4.  Takes insulin at home.  Monitor blood sugars   History of anxiety/depression: On Lexapro.   GERD: On Protonix   Hyperlipidemia: On Lipitor   Prolonged QTc: QTc of 538.  Holding Lexapro that she takes at home.  We have recommended to check EKG by following with the PCP in a week   history of lung cancer: Treated with radiation.  Currently in  remission.  CT imaging showed stable appearance of right upper lobe paramediastinal opacities and right apical pleural thickening  COPD exacerbation: Found to be wheezing today.  No pneumonia or pleural effusion as per the chest x-ray.  Will treat with prednisone for 5 days course   Generalized weakness: PT/OT consulted.  Recommending home health      Discharge Diagnoses:  Principal Problem:   Chest pain Active Problems:   Hyperlipidemia   Essential hypertension   Chronic diastolic (congestive) heart failure (HCC)   Orthostatic hypotension    Discharge Instructions  Discharge Instructions     Diet - low sodium heart healthy   Complete by: As directed    Discharge instructions   Complete by: As directed    1)Please take prescribed medications as instructed 2)Follow up with your PCP in a week.  Do an EKG test during the follow-up to check your QT interval.  We are holding Lexapro for now because it can cause further QT prolongation which can cause arrythmias. 3)Follow up with home health.   Increase activity slowly   Complete by: As directed       Allergies as of 05/23/2022       Reactions   Lactose Intolerance (gi) Diarrhea   Motrin [ibuprofen] Other (See Comments)   Anxiousness Hyperventilates         Medication List     STOP taking these medications    escitalopram 20 MG tablet Commonly known as: LEXAPRO   metoprolol tartrate 25 MG tablet Commonly known as: LOPRESSOR       TAKE these medications    albuterol 108 (90  Base) MCG/ACT inhaler Commonly known as: VENTOLIN HFA INHALE 2 PUFFS BY MOUTH EVERY 6 HOURS AS NEEDED FOR WHEEZING OR SHORTNESS OF BREATH What changed:  how much to take when to take this reasons to take this additional instructions   albuterol (2.5 MG/3ML) 0.083% nebulizer solution Commonly known as: PROVENTIL USE 1 VIAL IN NEBULIZER EVERY 4 HOURS AS NEEDED FOR WHEEZING AND FOR SHORTNESS OF BREATH What changed: See the new  instructions.   albuterol (5 MG/ML) 0.5% nebulizer solution Commonly known as: PROVENTIL Take 1 mL (5 mg total) by nebulization every 6 (six) hours as needed for wheezing or shortness of breath. What changed: Another medication with the same name was changed. Make sure you understand how and when to take each.   aspirin EC 81 MG tablet Take 81 mg by mouth daily.   atorvastatin 40 MG tablet Commonly known as: LIPITOR Take 1 tablet (40 mg total) by mouth daily. What changed: when to take this   buPROPion 150 MG 24 hr tablet Commonly known as: WELLBUTRIN XL Take 1 tablet by mouth once daily   CALCIUM + VITAMIN D3 PO Take 1-2 tablets by mouth See admin instructions. 1 tablet in the morning, 2 tablets in the evening   cetirizine 10 MG tablet Commonly known as: ZYRTEC Take 10 mg by mouth every evening.   HumuLIN 70/30 KwikPen (70-30) 100 UNIT/ML KwikPen Generic drug: insulin isophane & regular human KwikPen INJECT 4 UNITS SUBCUTANEOUSLY WITH BREAKFAST AND 2 UNITS WITH SUPPER What changed:  how much to take how to take this when to take this additional instructions   ibandronate 150 MG tablet Commonly known as: Boniva Take 1 tablet (150 mg total) by mouth every 30 (thirty) days. Take in the morning with a full glass of water, on an empty stomach, and do not take anything else by mouth or lie down for the next 30 min. What changed: additional instructions   MULTIVITAMIN WOMEN PO Take 1 tablet by mouth daily.   pantoprazole 40 MG tablet Commonly known as: PROTONIX Take 1 tablet (40 mg total) by mouth daily.   predniSONE 20 MG tablet Commonly known as: DELTASONE Take 2 tablets (40 mg total) by mouth daily for 5 days.        Follow-up Information     McGowen, [name], MD. Schedule an appointment as soon as possible for a visit in 1 week(s).   Specialty: Family Medicine Contact information: [address] [place] [address] [phone]                 Allergies  Allergen Reactions   Lactose Intolerance (Gi) Diarrhea   Motrin [Ibuprofen] Other (See Comments)    Anxiousness Hyperventilates     Consultations: None   Procedures/Studies: DG CHEST PORT 1 VIEW  Result Date: 05/23/2022 CLINICAL DATA:  Shortness of breath, chest pain EXAM: PORTABLE CHEST 1 VIEW COMPARISON:  Radiograph 05/21/2022 FINDINGS: Unchanged cardiomediastinal silhouette. Stable right apical volume loss, consolidation, and bronchiectasis. There is no new airspace disease. No pleural effusion. No pneumothorax. Chronic posttraumatic deformity of the right proximal humerus. Thoracic spondylosis. IMPRESSION: Stable chronic right apical opacities.  No new airspace disease. Electronically Signed   By: Maurine Simmering M.D.   On: 05/23/2022 09:39   ECHOCARDIOGRAM COMPLETE  Result Date: 05/22/2022    ECHOCARDIOGRAM REPORT   Patient Name:   [name] Date of Exam: 05/22/2022 Medical Rec #:  [id number]  Height:       60.0 in Accession #:    [id number]          Weight:       144.2 lb Date of Birth:  1946-04-03           BSA:          1.624 m Patient Age:    29 years            BP:           113/48 mmHg Patient Gender: F                   HR:           96 bpm. Exam Location:  Inpatient Procedure: 2D Echo, Color Doppler, Cardiac Doppler and Intracardiac            Opacification Agent Indications:    R07.9* Chest pain, unspecified  History:        Patient has prior history of Echocardiogram examinations, most                 recent 03/07/2017. CAD, COPD, Arrythmias:LBBB; Risk                 Factors:Hypertension, Diabetes, Dyslipidemia and Lung Cancer.                 History of Takotsubo Cardiomyopathy.  Sonographer:    Raquel Sarna Senior RDCS Referring Phys: [id number] Lindsey  Sonographer Comments: Very poor echo windows even with definity due to lung interference. IMPRESSIONS  1. Windows are challenging. Left ventricular ejection fraction, by estimation, is 60 to 65%. The left  ventricle has normal function. The left ventricle has no regional wall motion abnormalities. Indeterminate diastolic filling due to E-A fusion.  2. Right ventricular systolic function is normal. The right ventricular size is normal.  3. The mitral valve was not well visualized. No evidence of mitral valve regurgitation.  4. The aortic valve was not well visualized. Aortic valve regurgitation is not visualized.  5. The inferior vena cava is normal in size with greater than 50% respiratory variability, suggesting right atrial pressure of 3 mmHg. FINDINGS  Left Ventricle: Windows are challenging. Left ventricular ejection fraction, by estimation, is 60 to 65%. The left ventricle has normal function. The left ventricle has no regional wall motion abnormalities. Definity contrast agent was given IV to delineate the left ventricular endocardial borders. The left ventricular internal cavity size was normal in size. There is no left ventricular hypertrophy. Indeterminate diastolic filling due to E-A fusion. Right Ventricle: The right ventricular size is normal. Right ventricular systolic function is normal. Left Atrium: Left atrial size was normal in size. Right Atrium: Right atrial size was normal in size. Pericardium: The pericardium was not well visualized. Mitral Valve: The mitral valve was not well visualized. No evidence of mitral valve regurgitation. Tricuspid Valve: The tricuspid valve is not well visualized. Tricuspid valve regurgitation is not demonstrated. Aortic Valve: The aortic valve was not well visualized. Aortic valve regurgitation is not visualized. Pulmonic Valve: The pulmonic valve was not well visualized. Pulmonic valve regurgitation is not visualized. Aorta: The aortic root and ascending aorta are structurally normal, with no evidence of dilitation. Venous: The inferior vena cava is normal in size with greater than 50% respiratory variability, suggesting right atrial pressure of 3 mmHg. IAS/Shunts: The  interatrial septum was not well visualized.  LEFT VENTRICLE PLAX 2D LVIDd:  3.80 cm   Diastology LVIDs:         2.70 cm   LV e' lateral:   12.60 cm/s LV PW:         1.00 cm   LV E/e' lateral: 10.6 LV IVS:        0.90 cm LVOT diam:     2.00 cm LV SV:         52 LV SV Index:   32 LVOT Area:     3.14 cm  RIGHT VENTRICLE RV S prime:     13.50 cm/s TAPSE (M-mode): 1.8 cm LEFT ATRIUM             Index        RIGHT ATRIUM           Index LA diam:        2.90 cm 1.79 cm/m   RA Area:     21.30 cm LA Vol (A2C):   42.9 ml 26.41 ml/m  RA Volume:   72.20 ml  44.45 ml/m LA Vol (A4C):   35.5 ml 21.86 ml/m LA Biplane Vol: 40.4 ml 24.87 ml/m  AORTIC VALVE LVOT Vmax:   80.20 cm/s LVOT Vmean:  58.300 cm/s LVOT VTI:    0.164 m  AORTA Ao Root diam: 3.00 cm Ao Asc diam:  2.90 cm MITRAL VALVE MV Area (PHT): 4.17 cm     SHUNTS MV Decel Time: 182 msec     Systemic VTI:  0.16 m MV E velocity: 134.00 cm/s  Systemic Diam: 2.00 cm MV A velocity: 44.80 cm/s MV E/A ratio:  2.99 Mary Scientist, physiological signed by Phineas Inches Signature Date/Time: 05/22/2022/1:50:46 PM    Final    CT Angio Chest PE W and/or Wo Contrast  Result Date: 05/21/2022 CLINICAL DATA:  Chest pain. EXAM: CT ANGIOGRAPHY CHEST WITH CONTRAST TECHNIQUE: Multidetector CT imaging of the chest was performed using the standard protocol during bolus administration of intravenous contrast. Multiplanar CT image reconstructions and MIPs were obtained to evaluate the vascular anatomy. RADIATION DOSE REDUCTION: This exam was performed according to the departmental dose-optimization program which includes automated exposure control, adjustment of the mA and/or kV according to patient size and/or use of iterative reconstruction technique. CONTRAST:  16mL OMNIPAQUE IOHEXOL 350 MG/ML SOLN COMPARISON:  CT chest 09/08/2021 FINDINGS: Cardiovascular: Satisfactory opacification of the pulmonary arteries to the segmental level. No evidence of pulmonary embolism. Normal heart size.  Small pericardial effusion is unchanged. There are atherosclerotic calcifications of the aorta. Mediastinum/Nodes: Coarse calcification is again seen in the right thyroid gland. Esophagus appears nondilated. There is prominence of right hilar soft tissue similar to the prior study without discrete measurable lymph node. Lungs/Pleura: Pleural-parenchymal opacity with retraction and air bronchograms in the right upper lobe persists abutting the right hilum and upper right mediastinum. Right apical pleural thickening is unchanged. Overall dimensions have not significantly changed, but there is some increased narrowing of the right mainstem bronchus when compared to the prior exam. There is a trace layering right pleural effusion which is new. 12 mm ground-glass nodular density in the left lung apex is unchanged. No new focal lung infiltrate. Upper Abdomen: No acute findings. Cholecystectomy clips are present. Musculoskeletal: Degenerative changes affect the spine Review of the MIP images confirms the above findings. IMPRESSION: 1. No evidence for pulmonary embolism. 2. Stable appearance of right upper lobe/paramediastinal opacities and right apical pleural thickening. There has been mild progression of narrowing of the right mainstem bronchus which may represent progressive  scarring or malignancy. 3. New trace right pleural effusion. 4. Stable small pericardial effusion. 5. 12 mm left upper lobe ground-glass nodule is unchanged. Initial follow-up with CT at 6 months is recommended to confirm persistence. If persistent, repeat CT is recommended every 2 years until 5 years of stability has been established. This recommendation follows the consensus statement: Guidelines for Management of Incidental Pulmonary Nodules Detected on CT Images: From the Fleischner Society 2017; Radiology 2017; 284:228-243. Aortic Atherosclerosis (ICD10-I70.0). Electronically Signed   By: Ronney Asters M.D.   On: 05/21/2022 19:59   DG Chest  Port 1 View  Result Date: 05/21/2022 CLINICAL DATA:  Chest pain. EXAM: PORTABLE CHEST 1 VIEW COMPARISON:  Chest x-ray 03/06/2022.  Chest CT 09/08/2021. FINDINGS: Chronic parenchymal consolidation in the medial right upper lobe appear similar to the prior examinations. There is no new focal lung infiltrate. There is no pleural effusion or pneumothorax. The cardiomediastinal silhouette is within normal limits. There are surgical clips in the right abdomen. No acute fractures are seen. IMPRESSION: 1. No acute abnormality. 2. Stable chronic parenchymal opacities in the right upper lobe. Electronically Signed   By: Ronney Asters M.D.   On: 05/21/2022 17:00      Subjective: Patient seen and examined the bedside this morning.  Hemodynamically stable.  Orthostatic vitals checked this morning were negative.  She remains chest pain-free.  I called the daughter and discussed about the discharge planning.  Discharge Exam: Vitals:   05/22/22 0835 05/22/22 2048  BP: (!) 114/52 (!) 123/41  Pulse: 76 82  Resp:  15  Temp:  98.4 F (36.9 C)  SpO2: 95% 97%   Vitals:   05/22/22 0503 05/22/22 0800 05/22/22 0835 05/22/22 2048  BP: (!) 113/48  (!) 114/52 (!) 123/41  Pulse: 73 73 76 82  Resp: 18   15  Temp: 97.8 F (36.6 C)   98.4 F (36.9 C)  TempSrc: Oral   Oral  SpO2:  97% 95% 97%  Weight:      Height:        General: Pt is alert, awake, not in acute distress Cardiovascular: RRR, S1/S2 +, no rubs, no gallops Respiratory: CTA bilaterally, no wheezing, no rhonchi Abdominal: Soft, NT, ND, bowel sounds + Extremities: no edema, no cyanosis    The results of significant diagnostics from this hospitalization (including imaging, microbiology, ancillary and laboratory) are listed below for reference.     Microbiology: No results found for this or any previous visit (from the past 240 hour(s)).   Labs: BNP (last 3 results) No results for input(s): "BNP" in the last 8760 hours. Basic Metabolic  Panel: Recent Labs  Lab 05/21/22 1652 05/21/22 1941 05/22/22 0256  NA 138  --  136  K 4.4  --  4.3  CL 107  --  104  CO2 26  --  27  GLUCOSE 127*  --  82  BUN 20  --  15  CREATININE 1.09* 1.17* 1.07*  CALCIUM 8.1*  --  8.0*  MG  --   --  2.0  PHOS  --   --  2.9   Liver Function Tests: Recent Labs  Lab 05/22/22 0256  AST 18  ALT 15  ALKPHOS 59  BILITOT 0.4  PROT 5.7*  ALBUMIN 3.1*   No results for input(s): "LIPASE", "AMYLASE" in the last 168 hours. No results for input(s): "AMMONIA" in the last 168 hours. CBC: Recent Labs  Lab 05/21/22 1652 05/22/22 0256  WBC 7.3 6.6  NEUTROABS  --  4.0  HGB 11.2* 11.1*  HCT 34.4* 33.6*  MCV 91.0 89.8  PLT 199 178   Cardiac Enzymes: No results for input(s): "CKTOTAL", "CKMB", "CKMBINDEX", "TROPONINI" in the last 168 hours. BNP: Invalid input(s): "POCBNP" CBG: Recent Labs  Lab 05/22/22 0835 05/22/22 1235 05/22/22 1604 05/22/22 2048 05/23/22 0748  GLUCAP 85 166* 178* 108* 106*   D-Dimer No results for input(s): "DDIMER" in the last 72 hours. Hgb A1c No results for input(s): "HGBA1C" in the last 72 hours. Lipid Profile No results for input(s): "CHOL", "HDL", "LDLCALC", "TRIG", "CHOLHDL", "LDLDIRECT" in the last 72 hours. Thyroid function studies No results for input(s): "TSH", "T4TOTAL", "T3FREE", "THYROIDAB" in the last 72 hours.  Invalid input(s): "FREET3" Anemia work up No results for input(s): "VITAMINB12", "FOLATE", "FERRITIN", "TIBC", "IRON", "RETICCTPCT" in the last 72 hours. Urinalysis    Component Value Date/Time   COLORURINE YELLOW 05/21/2022 2229   APPEARANCEUR HAZY (A) 05/21/2022 2229   LABSPEC 1.025 05/21/2022 2229   PHURINE 6.0 05/21/2022 2229   GLUCOSEU NEGATIVE 05/21/2022 2229   HGBUR NEGATIVE 05/21/2022 2229   BILIRUBINUR NEGATIVE 05/21/2022 2229   KETONESUR NEGATIVE 05/21/2022 2229   PROTEINUR NEGATIVE 05/21/2022 2229   UROBILINOGEN 1.0 02/03/2013 0912   NITRITE NEGATIVE 05/21/2022 2229    LEUKOCYTESUR LARGE (A) 05/21/2022 2229   Sepsis Labs Recent Labs  Lab 05/21/22 1652 05/22/22 0256  WBC 7.3 6.6   Microbiology No results found for this or any previous visit (from the past 240 hour(s)).  Please note: You were cared for by a hospitalist during your hospital stay. Once you are discharged, your primary care physician will handle any further medical issues. Please note that NO REFILLS for any discharge medications will be authorized once you are discharged, as it is imperative that you return to your primary care physician (or establish a relationship with a primary care physician if you do not have one) for your post hospital discharge needs so that they can reassess your need for medications and monitor your lab values.    Time coordinating discharge: 40 minutes  SIGNED:   Shelly Coss, MD  [facility] 05/23/2022, 10:32 AM Pager [phone]  If 7PM-7AM, please contact night-coverage [email or web address] Password [email or web address]

## 2022-05-23 NOTE — Progress Notes (Signed)
Mobility Specialist - Progress Note   05/23/22 1100  Mobility  Activity Ambulated with assistance in hallway  Level of Assistance Moderate assist, patient does 50-74%  Assistive Device Front wheel walker  Distance Ambulated (ft) 180 ft  Activity Response Tolerated well  $Mobility charge 1 Mobility   Pt received in chair and agreeable to mobility. C/o right leg pain during mobility. ModA to stand w/ contact guard throughout ambulation. Pt to bed after session with all needs met.    Larey Seat

## 2022-05-24 DIAGNOSIS — I130 Hypertensive heart and chronic kidney disease with heart failure and stage 1 through stage 4 chronic kidney disease, or unspecified chronic kidney disease: Secondary | ICD-10-CM | POA: Diagnosis not present

## 2022-05-24 DIAGNOSIS — J441 Chronic obstructive pulmonary disease with (acute) exacerbation: Secondary | ICD-10-CM | POA: Diagnosis not present

## 2022-05-24 DIAGNOSIS — E119 Type 2 diabetes mellitus without complications: Secondary | ICD-10-CM | POA: Diagnosis not present

## 2022-05-24 DIAGNOSIS — I5032 Chronic diastolic (congestive) heart failure: Secondary | ICD-10-CM | POA: Diagnosis not present

## 2022-05-24 DIAGNOSIS — I951 Orthostatic hypotension: Secondary | ICD-10-CM | POA: Diagnosis not present

## 2022-05-24 DIAGNOSIS — F32A Depression, unspecified: Secondary | ICD-10-CM | POA: Diagnosis not present

## 2022-05-26 ENCOUNTER — Telehealth: Payer: Self-pay

## 2022-05-26 DIAGNOSIS — I951 Orthostatic hypotension: Secondary | ICD-10-CM | POA: Diagnosis not present

## 2022-05-26 DIAGNOSIS — I5032 Chronic diastolic (congestive) heart failure: Secondary | ICD-10-CM | POA: Diagnosis not present

## 2022-05-26 DIAGNOSIS — F32A Depression, unspecified: Secondary | ICD-10-CM | POA: Diagnosis not present

## 2022-05-26 DIAGNOSIS — J441 Chronic obstructive pulmonary disease with (acute) exacerbation: Secondary | ICD-10-CM | POA: Diagnosis not present

## 2022-05-26 DIAGNOSIS — E119 Type 2 diabetes mellitus without complications: Secondary | ICD-10-CM | POA: Diagnosis not present

## 2022-05-26 DIAGNOSIS — I130 Hypertensive heart and chronic kidney disease with heart failure and stage 1 through stage 4 chronic kidney disease, or unspecified chronic kidney disease: Secondary | ICD-10-CM | POA: Diagnosis not present

## 2022-05-26 NOTE — Telephone Encounter (Signed)
Caller/Agency: Remo Lipps w/[facility] Callback Number: [phone]  Requesting OT/PT/Skilled Nursing/Social Work/Speech Therapy: PT  Frequency:   1 time weekly for 5 weeks

## 2022-05-26 NOTE — Telephone Encounter (Signed)
Yes, okay.

## 2022-05-26 NOTE — Telephone Encounter (Signed)
Okay for orders? 

## 2022-05-27 ENCOUNTER — Encounter: Payer: Self-pay | Admitting: Family Medicine

## 2022-05-27 ENCOUNTER — Ambulatory Visit (INDEPENDENT_AMBULATORY_CARE_PROVIDER_SITE_OTHER): Payer: Medicare Other | Admitting: Family Medicine

## 2022-05-27 VITALS — BP 128/74 | HR 96 | Temp 98.0°F | Wt 140.4 lb

## 2022-05-27 DIAGNOSIS — J441 Chronic obstructive pulmonary disease with (acute) exacerbation: Secondary | ICD-10-CM

## 2022-05-27 DIAGNOSIS — R9431 Abnormal electrocardiogram [ECG] [EKG]: Secondary | ICD-10-CM | POA: Diagnosis not present

## 2022-05-27 DIAGNOSIS — R072 Precordial pain: Secondary | ICD-10-CM

## 2022-05-27 DIAGNOSIS — I951 Orthostatic hypotension: Secondary | ICD-10-CM

## 2022-05-27 NOTE — Patient Instructions (Signed)
Take 1/2 of 20mg  lexapro every day for 10 days, then take 1/2 tab every other day for 10 doses, then stop

## 2022-05-27 NOTE — Progress Notes (Addendum)
05/27/2022  CC:  Chief Complaint  Patient presents with   Hypotension    Hosp f/u    Patient is a 76 y.o. Caucasian female who presents accompanied by her daughter [name] for  hospital follow up. Dates hospitalized: 7/29 through 05/23/2022. Days since d/c from hospital: 4 days Patient was discharged from hospital to home, with home health services. Reason for admission to hospital: Chest pain and orthostatic hypotension. Date of interactive (phone) contact with patient and/or caregiver: None.  I have reviewed patient's discharge summary plus pertinent specific notes, labs, and imaging from the hospitalization.   She ruled out for MI. echo showed normal ejection fraction, no wall motion abnormality. Was found to have orthostatic hypotension.  Metoprolol was discontinued.  Her QTc was prolonged->538 (she has hx of this since approx 2018).  Lexapro was held. She has COPD and was found to have some wheezing in the hospital so she was treated with prednisone upon discharge.  Currently : She feels back to her baseline state of health now. She has had no further chest pain or dizziness.  Historically she has had some intermittent periods of orthostatic dizziness. She finishes her prednisone tomorrow.  Denies wheezing, cough, fever, or shortness of breath. We discussed Lexapro some today.  Daughter and patient say it is hard to tell if this medication has helped in the past or not.  Medication reconciliation was done today and patient is taking meds as recommended by discharging hospitalist/specialist.    PMH:  Past Medical History:  Diagnosis Date   Acute renal failure ([place])    Age-related nuclear cataract of both eyes 2016   +cortical age related cataracts OU    Anxiety and depression    Bilateral diabetic retinopathy ([place]) 2015   Dr. 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   Colonic diverticular abscess    COPD (chronic obstructive pulmonary disease) (HCC)    CVA (cerebral vascular accident) ([place]) 09/2016   MRI did show a left-sided small ischemic stroke which was acute but likely incidental (MRI was done b/c pt had TIA sx's in hospital).  Neuro put her on plavix at that time.  Cardiology d/c'd her plavix 08/2017.   Diabetes mellitus with complication (HCC)    diab retinopathy OU (laser)   Diverticulitis of large intestine with perforation and abscess 09/11/2016   Finger fracture, right 04/2020   R 5th metacarpal fx at the base (Dr. Mardelle Matte)   GERD (gastroesophageal reflux disease)    protonix   History of concussion 08/25/2018   w/out loss of consciousness--mild increase in baseline memory impairment after.  CT head neg acute.   History of diverticulitis of colon    with abscess; required IR percutaneous drain placed 09/2016.   History of iron deficiency anemia    started iron 07/02/19.  EGD/colonoscopy 08/27/19 unrevealing. Capsule endoscopy considered but never done.   Hyperlipidemia    Hypertension    Left bundle branch block (LBBB) 12/16/2016   Lung cancer ([place])    non-small cell lung ca, stage III in 05/2011; systemic chemotherapy concurrent with radiation followed by prophylactic cranial irradiation and has been observation since July of 2010 with no evidence for disease recurrence-released from onc f/u 12/2014 (needs annual cxr by PCP).  CXR 08/2015 stable.  CT 07/2017 with ? sternal  met--Dr. Earlie Server did bx and this was NEG for malignancy.   Mild cognitive impairment with memory loss    Likely from brain radiation therapy   Non-obstructive CAD (coronary artery disease)    a. Cath 2006 preserved LV fxn, scattered irregularities without critical stenosis; b. 2008 stress echo negative for ischemia, but with hypertensive response; c. 08/2016 Cath: D1 25%, otw nl.   Normocytic  anemia 2018   Iron, vit B12, folate all normal 12/2016.   Open-angle glaucoma 2016   Dr. Shirley Muscat, (bilateral)---responding to topical therapy   Osteoarthritis of both hands    Osteoporosis 03/2016; 03/2018   03/2016 DEXA T-score -2.2.  03/2018 T-score L femoral neck  -2.7--boniva started. DEXA 04/2021 T scor -2.6->boniva continued. Plan rpt DEXA 2 yrs.   Pneumonia    hospitalization 11/2016; hypoxemic resp failure--d/c'd home with home oxygen therapy   Pulmonary nodule, left 08/2021   LUL 11 mm ground glass, needs f/u CT 08/2022.   Seasonal allergic rhinitis    Subclinical hyperthyroidism 2018   Takotsubo cardiomyopathy    a. 08/2016 Echo: EF 30-35%, gr1DD, PASP 91mHg;  b. 08/2016 Cath: nonobs Dzs;  c. 11/2016 Echo: EF 40-45%, no rwma, nl RV fxn, mild TR, PASP 356mg.  ECHO 02/2017 EF 50-55%, grd II DD---essentially resolved Takotsubo 02/2017.   [place]   a. 08/2016 in setting of diverticulitis & pneumoperitoneum and Takotsubo CM -->prolonged QT, seen by EP-->avoid meds with potential for QT prolongation.    PSH:  Past Surgical History:  Procedure Laterality Date   ABDOMINAL HYSTERECTOMY  1997   APPENDECTOMY     CARDIAC CATHETERIZATION N/A 09/14/2016   Procedure: Left Heart Cath and Coronary Angiography;  Surgeon: ThTroy SineMD;  [facility];  Service: Cardiovascular; Mild nonobstructive CAD with 85% smooth narrowing in the first diagonal branch of the LAD; 10% smooth narrowing of the ostial proximal left circumflex coronary artery; and a normal dominant RCA.   CARPAL TUNNEL RELEASE     CATARACT EXTRACTION Bilateral    CHOLECYSTECTOMY  2002   COLONOSCOPY  10/24/00; 08/27/19   2002 normal.  [employer] hemoccult testing via [facility] 06/18/15 was NEG.  2020->diverticulosis o/w nl.   dexa  03/2016; 03/2018   03/2018 T-score L femoral neck  -2.7 (worsened compared to osteopenic range 2017.) DEXA 05/03/21 stable T score -2.6 on boniva x 2 yrs->plan rpt DEXA 2  yrs.   ESOPHAGOGASTRODUODENOSCOPY  08/27/2019   Normal.   IR GENERIC HISTORICAL  09/19/2016   IR SINUS/FIST TUBE CHK-NON GI 09/19/2016 JaCorrie MckusickDO [facility]   IR GENERIC HISTORICAL  10/06/2016   IR RADIOLOGIST EVAL & MGMT 10/06/2016 [facility]   IR GENERIC HISTORICAL  10/26/2016   IR RADIOLOGIST EVAL & MGMT 10/26/2016 [facility]   IR GENERIC HISTORICAL  11/03/2016   IR RADIOLOGIST EVAL & MGMT 11/03/2016 WeArdis RowanPA-C [facility]   IR GENERIC HISTORICAL  11/24/2016   IR RADIOLOGIST EVAL & MGMT 11/24/2016 WeArdis RowanPA-C [facility]   IR GENERIC HISTORICAL  12/05/2016   Fistula smaller/improving.  IR SINUS/FIST TUBE CHK-NON GI 12/05/2016 JoSandi MariscalMD [facility]   IR GENERIC HISTORICAL  12/22/2016   IR RADIOLOGIST EVAL & MGMT 12/22/2016 MiGreggory KeenMD [facility]   IR RADIOLOGIST EVAL & MGMT  01/10/2017   IR RADIOLOGIST EVAL & MGMT  02/09/2017   IR SINUS/FIST TUBE CHK-NON GI  03/28/2017   LEFT HEART CATHETERIZATION  WITH CORONARY ANGIOGRAM N/A 05/30/2012   Procedure: LEFT HEART CATHETERIZATION WITH CORONARY ANGIOGRAM;  Surgeon: Burnell Blanks, MD;  Location: [facility];  Service: Cardiovascular: Mild, non-obstructive CAD   TRANSTHORACIC ECHOCARDIOGRAM  09/14/2016; 11/2016   08/2016: EF 30-35 %, Akinesis of the mid-apicalanteroseptal myocardium.  Grade I DD.  Mild pulm HTN.  Repeat echo 11/2016, EF 40-45%, no rwma, nl RV fxn, mild TR, PASP 74mHg.     TRANSTHORACIC ECHOCARDIOGRAM  03/07/2017   EF 50-55%. Hypokinesis of the distal septum with overall low normal LV,  systolic function; mild diastolic dysfunction with elevated LV  filling pressure; mildly calcified aortic valve with mild AI; small pericardial effusion    MEDS:  Outpatient Medications Prior to Visit  Medication Sig Dispense Refill   albuterol (PROVENTIL) (2.5 MG/3ML) 0.083% nebulizer solution USE 1 VIAL IN NEBULIZER EVERY 4 HOURS AS NEEDED FOR WHEEZING AND  FOR SHORTNESS OF BREATH (Patient taking differently: Take 2.5 mg by nebulization every 4 (four) hours as needed for wheezing or shortness of breath.) 90 mL 1   albuterol (PROVENTIL) (5 MG/ML) 0.5% nebulizer solution Take 1 mL (5 mg total) by nebulization every 6 (six) hours as needed for wheezing or shortness of breath. 20 mL 2   albuterol (VENTOLIN HFA) 108 (90 Base) MCG/ACT inhaler INHALE 2 PUFFS BY MOUTH EVERY 6 HOURS AS NEEDED FOR WHEEZING OR SHORTNESS OF BREATH (Patient taking differently: 2 puffs every 6 (six) hours as needed for wheezing.) 18 g 1   aspirin 81 MG EC tablet Take 81 mg by mouth daily.      atorvastatin (LIPITOR) 40 MG tablet Take 1 tablet (40 mg total) by mouth daily. (Patient taking differently: Take 40 mg by mouth every evening.) 90 tablet 1   buPROPion (WELLBUTRIN XL) 150 MG 24 hr tablet Take 1 tablet by mouth once daily 90 tablet 0   Calcium Carb-Cholecalciferol (CALCIUM + VITAMIN D3 PO) Take 1-2 tablets by mouth See admin instructions. 1 tablet in the morning, 2 tablets in the evening     cetirizine (ZYRTEC) 10 MG tablet Take 10 mg by mouth every evening.     ibandronate (BONIVA) 150 MG tablet Take 1 tablet (150 mg total) by mouth every 30 (thirty) days. Take in the morning with a full glass of water, on an empty stomach, and do not take anything else by mouth or lie down for the next 30 min. (Patient taking differently: Take 150 mg by mouth every 30 (thirty) days.) 3 tablet 3   insulin isophane & regular human KwikPen (HUMULIN 70/30 KWIKPEN) (70-30) 100 UNIT/ML KwikPen INJECT 4 UNITS SUBCUTANEOUSLY WITH BREAKFAST AND 2 UNITS WITH SUPPER (Patient taking differently: Inject 4 Units into the skin 2 (two) times daily.) 15 mL 1   Multiple Vitamins-Minerals (MULTIVITAMIN WOMEN PO) Take 1 tablet by mouth daily.     pantoprazole (PROTONIX) 40 MG tablet Take 1 tablet (40 mg total) by mouth daily. 90 tablet 1   predniSONE (DELTASONE) 20 MG tablet Take 2 tablets (40 mg total) by mouth  daily for 5 days. 10 tablet 0   No facility-administered medications prior to visit.    Physical Exam    05/27/2022   11:18 AM 05/22/2022    8:48 PM 05/22/2022    8:35 AM  Vitals with BMI  Weight 140 lbs 6 oz    BMI 274.94   Systolic [id number] Diastolic 74 41 52  Pulse 96 82 76   Gen: Alert, well appearing.  Patient is oriented to person, place, time, and situation. CV: RRR, no m/r/g.   LUNGS: CTA bilat, nonlabored resps, good aeration in all lung fields. EXT: no clubbing or cyanosis.  no edema.     Pertinent labs/imaging Last CBC Lab Results  Component Value Date   WBC 6.6 05/22/2022   HGB 11.1 (L) 05/22/2022   HCT 33.6 (L) 05/22/2022   MCV 89.8 05/22/2022   MCH 29.7 05/22/2022   RDW 14.2 05/22/2022   PLT 178 05/22/2022   Lab Results  Component Value Date   IRON 44 10/11/2019   TIBC 236 (L) 10/11/2019   FERRITIN 86 10/11/2019   Lab Results  Component Value Date   VITAMINB12 1,454 (H) 28/78/6767   Last metabolic panel Lab Results  Component Value Date   GLUCOSE 82 05/22/2022   NA 136 05/22/2022   K 4.3 05/22/2022   CL 104 05/22/2022   CO2 27 05/22/2022   BUN 15 05/22/2022   CREATININE 1.07 (H) 05/22/2022   GFRNONAA 55 (L) 05/22/2022   CALCIUM 8.0 (L) 05/22/2022   PHOS 2.9 05/22/2022   PROT 5.7 (L) 05/22/2022   ALBUMIN 3.1 (L) 05/22/2022   LABGLOB 2.7 09/19/2018   AGRATIO 1.6 09/19/2018   BILITOT 0.4 05/22/2022   ALKPHOS 59 05/22/2022   AST 18 05/22/2022   ALT 15 05/22/2022   ANIONGAP 5 05/22/2022   Last lipids Lab Results  Component Value Date   CHOL 130 02/02/2022   HDL 52.10 02/02/2022   LDLCALC 55 02/02/2022   LDLDIRECT 162.0 07/17/2015   TRIG 111.0 02/02/2022   CHOLHDL 2 02/02/2022   Last hemoglobin A1c Lab Results  Component Value Date   HGBA1C 6.4 (A) 05/04/2022   HGBA1C 6.4 05/04/2022   HGBA1C 6.4 05/04/2022   HGBA1C 6.4 05/04/2022   Last thyroid functions Lab Results  Component Value Date   TSH 1.24 04/20/2017    T3TOTAL 94.7 04/20/2017   ASSESSMENT/PLAN:  #1 orthostatic hypotension. Doing well. We will remain off of metoprolol. Monitor blood pressure daily at home and review these in a few weeks. If blood pressure rises to greater than 140/90 consistently then will discuss starting a low-dose of amlodipine or possibly ARB/ACE-I with careful potassium and creatinine monitoring.  2.  Chest pain, suspect musculoskeletal/chest wall pain secondary to her recent coughing/COPD exacerbation. Resolved.  #3 COPD exacerbation, resolving appropriately with prednisone.  #4 prolonged QT interval. Chronic, with left bundle branch block.  Ischemia and structural cardiac abnormalities ruled out this hospitalization. Increased risk of further QT prolongation with Lexapro/SSRI--she will wean off of this over the next few weeks. Instructions:  Take 1/2 of 69m lexapro every day for 10 days, then take 1/2 tab every other day for 10 doses, then stop. Will try to avoid any new medications that would possibly prolong QT interval.  Medical decision making of moderate complexity was utilized today.  FOLLOW UP: 3 weeks follow-up blood pressure  Signed:  PCrissie Sickles MD           05/27/2022

## 2022-05-27 NOTE — Telephone Encounter (Signed)
Spoke with Remo Lipps regarding PT approved orders.

## 2022-05-31 ENCOUNTER — Telehealth: Payer: Self-pay

## 2022-05-31 DIAGNOSIS — I130 Hypertensive heart and chronic kidney disease with heart failure and stage 1 through stage 4 chronic kidney disease, or unspecified chronic kidney disease: Secondary | ICD-10-CM | POA: Diagnosis not present

## 2022-05-31 DIAGNOSIS — I951 Orthostatic hypotension: Secondary | ICD-10-CM | POA: Diagnosis not present

## 2022-05-31 DIAGNOSIS — F32A Depression, unspecified: Secondary | ICD-10-CM | POA: Diagnosis not present

## 2022-05-31 DIAGNOSIS — J441 Chronic obstructive pulmonary disease with (acute) exacerbation: Secondary | ICD-10-CM | POA: Diagnosis not present

## 2022-05-31 DIAGNOSIS — E119 Type 2 diabetes mellitus without complications: Secondary | ICD-10-CM | POA: Diagnosis not present

## 2022-05-31 DIAGNOSIS — I5032 Chronic diastolic (congestive) heart failure: Secondary | ICD-10-CM | POA: Diagnosis not present

## 2022-05-31 NOTE — Telephone Encounter (Signed)
FYI

## 2022-05-31 NOTE — Telephone Encounter (Signed)
Caller/Agency: Remo Lipps with [facility] Number: [phone]  Nurse calling to inform provider that patient fell this morning. No injuries to report.

## 2022-05-31 NOTE — Telephone Encounter (Signed)
noted 

## 2022-06-01 ENCOUNTER — Telehealth: Payer: Self-pay

## 2022-06-01 NOTE — Telephone Encounter (Signed)
Yes, okay.

## 2022-06-01 NOTE — Telephone Encounter (Signed)
Okay for orders? 

## 2022-06-01 NOTE — Telephone Encounter (Signed)
Caller/Agency: Philis Nettle  @ [facility] Number: [phone]  Requesting OT/PT/Skilled Nursing/Social Work/Speech Therapy: OT  Frequency:  1 time weekly for 4 weeks

## 2022-06-02 NOTE — Telephone Encounter (Signed)
Approved VO given to [facility] for OT.

## 2022-06-03 DIAGNOSIS — J441 Chronic obstructive pulmonary disease with (acute) exacerbation: Secondary | ICD-10-CM | POA: Diagnosis not present

## 2022-06-03 DIAGNOSIS — I5032 Chronic diastolic (congestive) heart failure: Secondary | ICD-10-CM | POA: Diagnosis not present

## 2022-06-03 DIAGNOSIS — F32A Depression, unspecified: Secondary | ICD-10-CM | POA: Diagnosis not present

## 2022-06-03 DIAGNOSIS — I130 Hypertensive heart and chronic kidney disease with heart failure and stage 1 through stage 4 chronic kidney disease, or unspecified chronic kidney disease: Secondary | ICD-10-CM | POA: Diagnosis not present

## 2022-06-03 DIAGNOSIS — I951 Orthostatic hypotension: Secondary | ICD-10-CM | POA: Diagnosis not present

## 2022-06-03 DIAGNOSIS — E119 Type 2 diabetes mellitus without complications: Secondary | ICD-10-CM | POA: Diagnosis not present

## 2022-06-07 DIAGNOSIS — J441 Chronic obstructive pulmonary disease with (acute) exacerbation: Secondary | ICD-10-CM | POA: Diagnosis not present

## 2022-06-07 DIAGNOSIS — I130 Hypertensive heart and chronic kidney disease with heart failure and stage 1 through stage 4 chronic kidney disease, or unspecified chronic kidney disease: Secondary | ICD-10-CM | POA: Diagnosis not present

## 2022-06-07 DIAGNOSIS — I951 Orthostatic hypotension: Secondary | ICD-10-CM | POA: Diagnosis not present

## 2022-06-07 DIAGNOSIS — I5032 Chronic diastolic (congestive) heart failure: Secondary | ICD-10-CM | POA: Diagnosis not present

## 2022-06-07 DIAGNOSIS — F32A Depression, unspecified: Secondary | ICD-10-CM | POA: Diagnosis not present

## 2022-06-07 DIAGNOSIS — E119 Type 2 diabetes mellitus without complications: Secondary | ICD-10-CM | POA: Diagnosis not present

## 2022-06-09 DIAGNOSIS — F32A Depression, unspecified: Secondary | ICD-10-CM | POA: Diagnosis not present

## 2022-06-09 DIAGNOSIS — J441 Chronic obstructive pulmonary disease with (acute) exacerbation: Secondary | ICD-10-CM | POA: Diagnosis not present

## 2022-06-09 DIAGNOSIS — E119 Type 2 diabetes mellitus without complications: Secondary | ICD-10-CM | POA: Diagnosis not present

## 2022-06-09 DIAGNOSIS — I130 Hypertensive heart and chronic kidney disease with heart failure and stage 1 through stage 4 chronic kidney disease, or unspecified chronic kidney disease: Secondary | ICD-10-CM | POA: Diagnosis not present

## 2022-06-09 DIAGNOSIS — I951 Orthostatic hypotension: Secondary | ICD-10-CM | POA: Diagnosis not present

## 2022-06-09 DIAGNOSIS — I5032 Chronic diastolic (congestive) heart failure: Secondary | ICD-10-CM | POA: Diagnosis not present

## 2022-06-13 DIAGNOSIS — E119 Type 2 diabetes mellitus without complications: Secondary | ICD-10-CM | POA: Diagnosis not present

## 2022-06-13 DIAGNOSIS — J441 Chronic obstructive pulmonary disease with (acute) exacerbation: Secondary | ICD-10-CM | POA: Diagnosis not present

## 2022-06-13 DIAGNOSIS — I951 Orthostatic hypotension: Secondary | ICD-10-CM | POA: Diagnosis not present

## 2022-06-13 DIAGNOSIS — F32A Depression, unspecified: Secondary | ICD-10-CM | POA: Diagnosis not present

## 2022-06-13 DIAGNOSIS — I130 Hypertensive heart and chronic kidney disease with heart failure and stage 1 through stage 4 chronic kidney disease, or unspecified chronic kidney disease: Secondary | ICD-10-CM | POA: Diagnosis not present

## 2022-06-13 DIAGNOSIS — I5032 Chronic diastolic (congestive) heart failure: Secondary | ICD-10-CM | POA: Diagnosis not present

## 2022-06-17 ENCOUNTER — Ambulatory Visit (INDEPENDENT_AMBULATORY_CARE_PROVIDER_SITE_OTHER): Payer: Medicare Other | Admitting: Family Medicine

## 2022-06-17 ENCOUNTER — Encounter: Payer: Self-pay | Admitting: Family Medicine

## 2022-06-17 VITALS — BP 114/68 | HR 104 | Temp 97.6°F | Ht 60.0 in | Wt 139.8 lb

## 2022-06-17 DIAGNOSIS — I951 Orthostatic hypotension: Secondary | ICD-10-CM | POA: Diagnosis not present

## 2022-06-17 DIAGNOSIS — I10 Essential (primary) hypertension: Secondary | ICD-10-CM

## 2022-06-17 NOTE — Progress Notes (Signed)
OFFICE VISIT  06/17/2022  CC:  Chief Complaint  Patient presents with   Headache   Patient is a 76 y.o. female who presents for 3 week f/u blood pressures. A/P as of last visit: "#1 orthostatic hypotension. Doing well. We will remain off of metoprolol. Monitor blood pressure daily at home and review these in a few weeks. If blood pressure rises to greater than 140/90 consistently then will discuss starting a low-dose of amlodipine or possibly ARB/ACE-I with careful potassium and creatinine monitoring.  2.  Chest pain, suspect musculoskeletal/chest wall pain secondary to her recent coughing/COPD exacerbation. Resolved.   #3 COPD exacerbation, resolving appropriately with prednisone.   #4 prolonged QT interval. Chronic, with left bundle branch block.  Ischemia and structural cardiac abnormalities ruled out this hospitalization. Increased risk of further QT prolongation with Lexapro/SSRI--she will wean off of this over the next few weeks. Instructions:  Take 1/2 of 9m lexapro every day for 10 days, then take 1/2 tab every other day for 10 doses, then stop. Will try to avoid any new medications that would possibly prolong QT interval.  INTERIM HX: SAmeishacontinues to feel better. For a while after last visit she had some orthostatic dizziness that was brief.  She has not had any of this in the last 1 week. No palpitations or feelings of heart pounding.  Blood pressures at home have been normal.  Past Medical History:  Diagnosis Date   Acute renal failure (HCC)    Age-related nuclear cataract of both eyes 2016   +cortical age related cataracts OU    Anxiety and depression    Bilateral diabetic retinopathy ([facility] [place]   Dr. 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   Colonic diverticular abscess     COPD (chronic obstructive pulmonary disease) (HCC)    CVA (cerebral vascular accident) ([facility] 09/2016   MRI did show a left-sided small ischemic stroke which was acute but likely incidental (MRI was done b/c pt had TIA sx's in hospital).  Neuro put her on plavix at that time.  Cardiology d/c'd her plavix 08/2017.   Diabetes mellitus with complication (HCC)    diab retinopathy OU (laser)   Diverticulitis of large intestine with perforation and abscess 09/11/2016   Finger fracture, right 04/2020   R 5th metacarpal fx at the base (Dr. LMardelle Matte   GERD (gastroesophageal reflux disease)    protonix   History of concussion 08/25/2018   w/out loss of consciousness--mild increase in baseline memory impairment after.  CT head neg acute.   History of diverticulitis of colon    with abscess; required IR percutaneous drain placed 09/2016.   History of iron deficiency anemia    started iron 07/02/19.  EGD/colonoscopy 08/27/19 unrevealing. Capsule endoscopy considered but never done.   Hyperlipidemia    Hypertension    Left bundle branch block (LBBB) 12/16/2016   [place]ung cancer ([facility] [place]    non-small cell lung ca, stage III in 05/2011; systemic chemotherapy concurrent with radiation followed by prophylactic cranial irradiation and has been observation since July of 2010 with no evidence for disease recurrence-released from onc f/u 12/2014 (needs annual cxr by PCP).  CXR 08/2015 stable.  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 MEarlie Serverdid bx and this was NEG for malignancy.   Mild cognitive impairment with memory  loss    Likely from brain radiation therapy   Non-obstructive CAD (coronary artery disease)    a. Cath 2006 preserved LV fxn, scattered irregularities without critical stenosis; b. 2008 stress echo negative for ischemia, but with hypertensive response; c. 08/2016 Cath: D1 25%, otw nl.   Normocytic anemia 2018   Iron, vit B12, folate all normal 12/2016.   Open-angle glaucoma 2016   Dr. Shirley Muscat,  (bilateral)---responding to topical therapy   Osteoarthritis of both hands    Osteoporosis 03/2016; 03/2018   03/2016 DEXA T-score -2.2.  03/2018 T-score L femoral neck  -2.7--boniva started. DEXA 04/2021 T scor -2.6->boniva continued. Plan rpt DEXA 2 yrs.   Pneumonia    hospitalization 11/2016; hypoxemic resp failure--d/c'd home with home oxygen therapy   Pulmonary nodule, left 08/2021   LUL 11 mm ground glass, needs f/u CT 08/2022.   Seasonal allergic rhinitis    Subclinical hyperthyroidism 2018   Takotsubo cardiomyopathy    a. 08/2016 Echo: EF 30-35%, gr1DD, PASP 37mHg;  b. 08/2016 Cath: nonobs Dzs;  c. 11/2016 Echo: EF 40-45%, no rwma, nl RV fxn, mild TR, PASP 345mg.  ECHO 02/2017 EF 50-55%, grd II DD---essentially resolved Takotsubo 02/2017.   [place]ors[place] de pointes ([facility]   a. 08/2016 in setting of diverticulitis & pneumoperitoneum and Takotsubo CM -->prolonged QT, seen by EP-->avoid meds with potential for QT prolongation.    Past Surgical History:  Procedure Laterality Date   ABDOMINAL HYSTERECTOMY  1997   APPENDECTOMY     CARDIAC CATHETERIZATION N/A 09/14/2016   Procedure: Left Heart Cath and Coronary Angiography;  Surgeon: ThTroy SineMD;  [facility];  Service: Cardiovascular; Mild nonobstructive CAD with 85% smooth narrowing in the first diagonal branch of the LAD; 10% smooth narrowing of the ostial proximal left circumflex coronary artery; and a normal dominant RCA.   CARPAL TUNNEL RELEASE     CATARACT EXTRACTION Bilateral    CHOLECYSTECTOMY  2002   COLONOSCOPY  10/24/00; 08/27/19   2002 normal.  [employer] hemoccult testing via [facility] 06/18/15 was NEG.  2020->diverticulosis o/w nl.   dexa  03/2016; 03/2018   03/2018 T-score L femoral neck  -2.7 (worsened compared to osteopenic range 2017.) DEXA 05/03/21 stable T score -2.6 on boniva x 2 yrs->plan rpt DEXA 2 yrs.   ESOPHAGOGASTRODUODENOSCOPY  08/27/2019   Normal.   IR GENERIC HISTORICAL  09/19/2016   IR  SINUS/FIST TUBE CHK-NON GI 09/19/2016 JaCorrie MckusickDO [facility]   IR GENERIC HISTORICAL  10/06/2016   IR RADIOLOGIST EVAL & MGMT 10/06/2016 [facility]   IR GENERIC HISTORICAL  10/26/2016   IR RADIOLOGIST EVAL & MGMT 10/26/2016 [facility]   IR GENERIC HISTORICAL  11/03/2016   IR RADIOLOGIST EVAL & MGMT 11/03/2016 WeArdis RowanPA-C [facility]   IR GENERIC HISTORICAL  11/24/2016   IR RADIOLOGIST EVAL & MGMT 11/24/2016 WeArdis RowanPA-C [facility]   IR GENERIC HISTORICAL  12/05/2016   Fistula smaller/improving.  IR SINUS/FIST TUBE CHK-NON GI 12/05/2016 JoSandi MariscalMD [facility]   IR GENERIC HISTORICAL  12/22/2016   IR RADIOLOGIST EVAL & MGMT 12/22/2016 MiGreggory KeenMD [facility]   IR RADIOLOGIST EVAL & MGMT  01/10/2017   IR RADIOLOGIST EVAL & MGMT  02/09/2017   IR SINUS/FIST TUBE CHK-NON GI  03/28/2017   LEFT HEART CATHETERIZATION WITH CORONARY ANGIOGRAM N/A 05/30/2012   Procedure: LEFT HEART CATHETERIZATION WITH CORONARY ANGIOGRAM;  Surgeon: ChBurnell Blanks  MD;  Location: [facility];  Service: Cardiovascular: Mild, non-obstructive CAD   TRANSTHORACIC ECHOCARDIOGRAM  09/14/2016; 11/2016   08/2016: EF 30-35 %, Akinesis of the mid-apicalanteroseptal myocardium.  Grade I DD.  Mild pulm HTN.  Repeat echo 11/2016, EF 40-45%, no rwma, nl RV fxn, mild TR, PASP 12mmHg.     TRANSTHORACIC ECHOCARDIOGRAM  03/07/2017   EF 50-55%. Hypokinesis of the distal septum with overall low normal LV,  systolic function; mild diastolic dysfunction with elevated LV  filling pressure; mildly calcified aortic valve with mild AI; small pericardial effusion    Outpatient Medications Prior to Visit  Medication Sig Dispense Refill   albuterol (PROVENTIL) (2.5 MG/3ML) 0.083% nebulizer solution USE 1 VIAL IN NEBULIZER EVERY 4 HOURS AS NEEDED FOR WHEEZING AND FOR SHORTNESS OF BREATH (Patient taking differently: Take 2.5 mg by nebulization every 4 (four) hours as needed  for wheezing or shortness of breath.) 90 mL 1   albuterol (PROVENTIL) (5 MG/ML) 0.5% nebulizer solution Take 1 mL (5 mg total) by nebulization every 6 (six) hours as needed for wheezing or shortness of breath. 20 mL 2   albuterol (VENTOLIN HFA) 108 (90 Base) MCG/ACT inhaler INHALE 2 PUFFS BY MOUTH EVERY 6 HOURS AS NEEDED FOR WHEEZING OR SHORTNESS OF BREATH (Patient taking differently: 2 puffs every 6 (six) hours as needed for wheezing.) 18 g 1   aspirin 81 MG EC tablet Take 81 mg by mouth daily.      atorvastatin (LIPITOR) 40 MG tablet Take 1 tablet (40 mg total) by mouth daily. (Patient taking differently: Take 40 mg by mouth every evening.) 90 tablet 1   buPROPion (WELLBUTRIN XL) 150 MG 24 hr tablet Take 1 tablet by mouth once daily 90 tablet 0   Calcium Carb-Cholecalciferol (CALCIUM + VITAMIN D3 PO) Take 1-2 tablets by mouth See admin instructions. 1 tablet in the morning, 2 tablets in the evening     cetirizine (ZYRTEC) 10 MG tablet Take 10 mg by mouth every evening.     ibandronate (BONIVA) 150 MG tablet Take 1 tablet (150 mg total) by mouth every 30 (thirty) days. Take in the morning with a full glass of water, on an empty stomach, and do not take anything else by mouth or lie down for the next 30 min. (Patient taking differently: Take 150 mg by mouth every 30 (thirty) days.) 3 tablet 3   insulin isophane & regular human KwikPen (HUMULIN 70/30 KWIKPEN) (70-30) 100 UNIT/ML KwikPen INJECT 4 UNITS SUBCUTANEOUSLY WITH BREAKFAST AND 2 UNITS WITH SUPPER (Patient taking differently: Inject 4 Units into the skin 2 (two) times daily.) 15 mL 1   Multiple Vitamins-Minerals (MULTIVITAMIN WOMEN PO) Take 1 tablet by mouth daily.     pantoprazole (PROTONIX) 40 MG tablet Take 1 tablet (40 mg total) by mouth daily. 90 tablet 1   No facility-administered medications prior to visit.    Allergies  Allergen Reactions   Lactose Intolerance (Gi) Diarrhea   Motrin [Ibuprofen] Other (See Comments)     Anxiousness Hyperventilates     ROS As per HPI  PE:    06/17/2022    3:26 PM 05/27/2022   11:18 AM 05/22/2022    8:48 PM  Vitals with BMI  Height $Remov'5\' 0"'deipFv$     Weight 139 lbs 13 oz 140 lbs 6 oz   BMI 42.6 83.41   Systolic 962 229 798  Diastolic 68 74 41  Pulse 921 96 82     Physical Exam  Gen: Alert, well appearing.  Patient is oriented to person, place, time, and situation. AFFECT: pleasant, lucid thought and speech. CV: RRR ([id number]), trace systolic flow murmur. Extremities: No edema.  LABS:  Last metabolic panel Lab Results  Component Value Date   GLUCOSE 82 05/22/2022   NA 136 05/22/2022   K 4.3 05/22/2022   CL 104 05/22/2022   CO2 27 05/22/2022   BUN 15 05/22/2022   CREATININE 1.07 (H) 05/22/2022   GFRNONAA 55 (L) 05/22/2022   CALCIUM 8.0 (L) 05/22/2022   PHOS 2.9 05/22/2022   PROT 5.7 (L) 05/22/2022   ALBUMIN 3.1 (L) 05/22/2022   LABGLOB 2.7 09/19/2018   AGRATIO 1.6 09/19/2018   BILITOT 0.4 05/22/2022   ALKPHOS 59 05/22/2022   AST 18 05/22/2022   ALT 15 05/22/2022   ANIONGAP 5 05/22/2022   Lab Results  Component Value Date   HGBA1C 6.4 (A) 05/04/2022   HGBA1C 6.4 05/04/2022   HGBA1C 6.4 05/04/2022   HGBA1C 6.4 05/04/2022   IMPRESSION AND PLAN:  #1 essential hypertension.  Recent orthostatic hypotension. She is doing better since completely getting off metoprolol.  Blood pressures have remained within normal limits.  Her heart rate has increased but she is asymptomatic.  An After Visit Summary was printed and given to the patient.  FOLLOW UP: Return for Keep appointment already set for 08/04/2022.  Signed:  Crissie Sickles, MD           06/17/2022

## 2022-06-20 DIAGNOSIS — I951 Orthostatic hypotension: Secondary | ICD-10-CM | POA: Diagnosis not present

## 2022-06-20 DIAGNOSIS — E119 Type 2 diabetes mellitus without complications: Secondary | ICD-10-CM | POA: Diagnosis not present

## 2022-06-20 DIAGNOSIS — I5032 Chronic diastolic (congestive) heart failure: Secondary | ICD-10-CM | POA: Diagnosis not present

## 2022-06-20 DIAGNOSIS — F32A Depression, unspecified: Secondary | ICD-10-CM | POA: Diagnosis not present

## 2022-06-20 DIAGNOSIS — J441 Chronic obstructive pulmonary disease with (acute) exacerbation: Secondary | ICD-10-CM | POA: Diagnosis not present

## 2022-06-20 DIAGNOSIS — I130 Hypertensive heart and chronic kidney disease with heart failure and stage 1 through stage 4 chronic kidney disease, or unspecified chronic kidney disease: Secondary | ICD-10-CM | POA: Diagnosis not present

## 2022-06-21 DIAGNOSIS — I130 Hypertensive heart and chronic kidney disease with heart failure and stage 1 through stage 4 chronic kidney disease, or unspecified chronic kidney disease: Secondary | ICD-10-CM | POA: Diagnosis not present

## 2022-06-21 DIAGNOSIS — J441 Chronic obstructive pulmonary disease with (acute) exacerbation: Secondary | ICD-10-CM | POA: Diagnosis not present

## 2022-06-21 DIAGNOSIS — I951 Orthostatic hypotension: Secondary | ICD-10-CM | POA: Diagnosis not present

## 2022-06-21 DIAGNOSIS — F32A Depression, unspecified: Secondary | ICD-10-CM | POA: Diagnosis not present

## 2022-06-21 DIAGNOSIS — I5032 Chronic diastolic (congestive) heart failure: Secondary | ICD-10-CM | POA: Diagnosis not present

## 2022-06-21 DIAGNOSIS — E119 Type 2 diabetes mellitus without complications: Secondary | ICD-10-CM | POA: Diagnosis not present

## 2022-06-28 ENCOUNTER — Telehealth: Payer: Self-pay | Admitting: Family Medicine

## 2022-06-28 DIAGNOSIS — E118 Type 2 diabetes mellitus with unspecified complications: Secondary | ICD-10-CM

## 2022-06-28 DIAGNOSIS — I951 Orthostatic hypotension: Secondary | ICD-10-CM | POA: Diagnosis not present

## 2022-06-28 DIAGNOSIS — I130 Hypertensive heart and chronic kidney disease with heart failure and stage 1 through stage 4 chronic kidney disease, or unspecified chronic kidney disease: Secondary | ICD-10-CM | POA: Diagnosis not present

## 2022-06-28 DIAGNOSIS — F32A Depression, unspecified: Secondary | ICD-10-CM | POA: Diagnosis not present

## 2022-06-28 DIAGNOSIS — I5032 Chronic diastolic (congestive) heart failure: Secondary | ICD-10-CM | POA: Diagnosis not present

## 2022-06-28 DIAGNOSIS — E119 Type 2 diabetes mellitus without complications: Secondary | ICD-10-CM | POA: Diagnosis not present

## 2022-06-28 DIAGNOSIS — J441 Chronic obstructive pulmonary disease with (acute) exacerbation: Secondary | ICD-10-CM | POA: Diagnosis not present

## 2022-06-28 MED ORDER — BLOOD GLUCOSE METER KIT: PACK | 0 refills | Status: DC

## 2022-06-28 NOTE — Telephone Encounter (Signed)
Patient's daughter states patient uses onetouch delia but for the past week, it has not been working properly. Advised a new meter would be sent.

## 2022-06-28 NOTE — Telephone Encounter (Signed)
Pts daughther called and reports that he mother is having problems with her diabetic meter. She says its really old and ask if she can have a new one ordered? Please contact patient with advice and information.

## 2022-06-30 ENCOUNTER — Other Ambulatory Visit: Payer: Self-pay | Admitting: Family Medicine

## 2022-07-12 IMAGING — CT CT CHEST W/O CM
1 series · 15 of 34 positions shown, 19 images · non-contrast
Comparison: 08/12/2017

CLINICAL DATA: Left lobe nodule seen on recent chest radiograph.
Personal history of right lung carcinoma and previous radiation
therapy.

EXAM:
CT CHEST WITHOUT CONTRAST
TECHNIQUE: Multidetector CT imaging of the chest was performed following the
standard protocol without IV contrast.

[Series 2: chest w/(date) · axial · 0.71mm/px · z∈[+899,+1155]mm · 15 of 152 slices shown, 19 images]
[im 12/152  mediastinal]
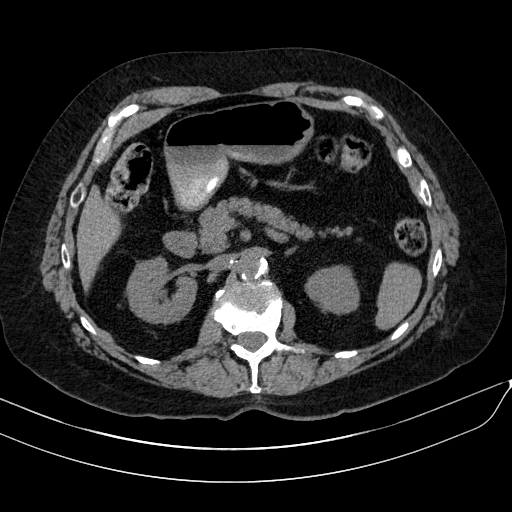
[im 12/152  lung]
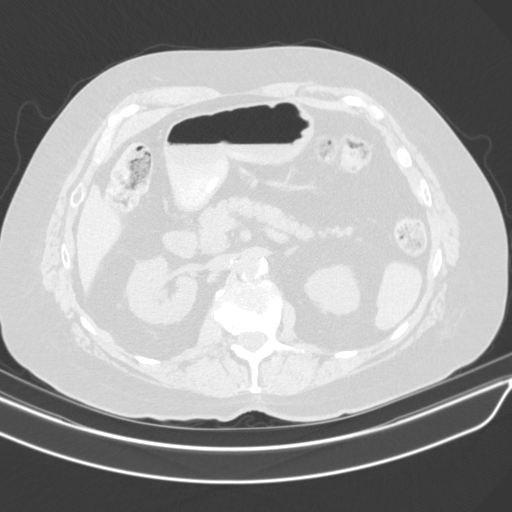
[im 23/152  lung]
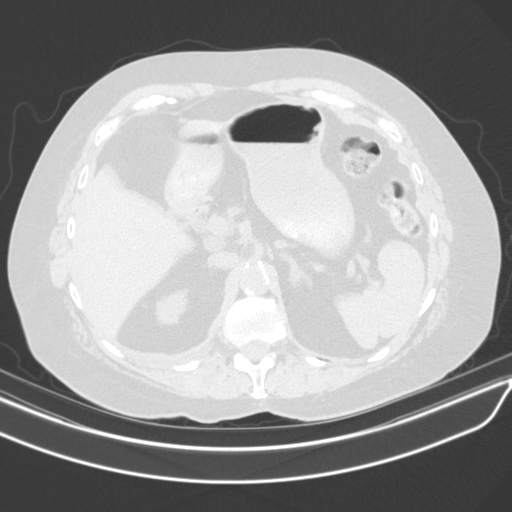
[im 31/152  lung]
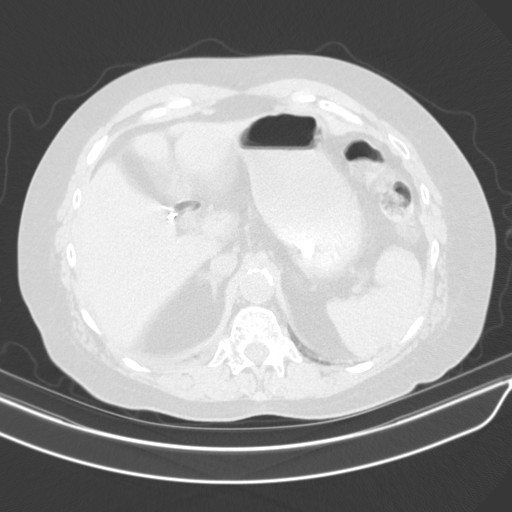
[im 40/152  lung]
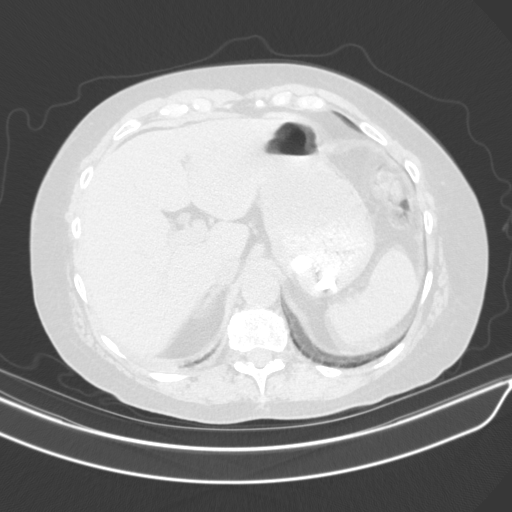
[im 51/152  mediastinal]
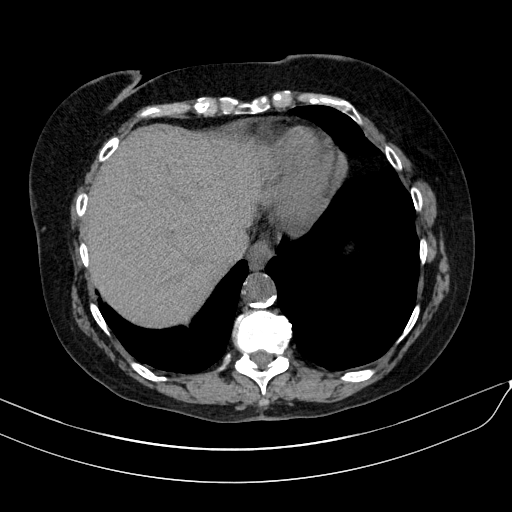
[im 51/152  lung]
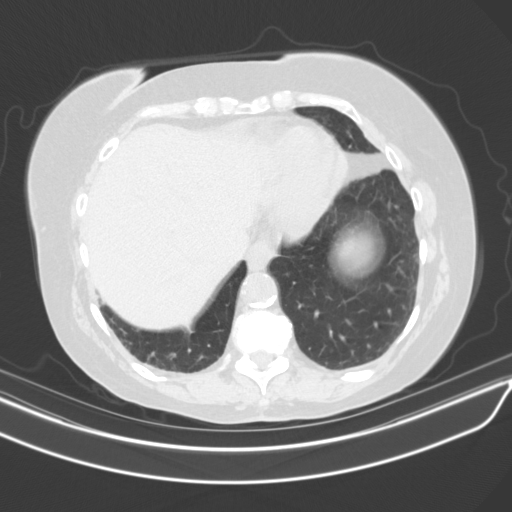
[im 61/152  lung]
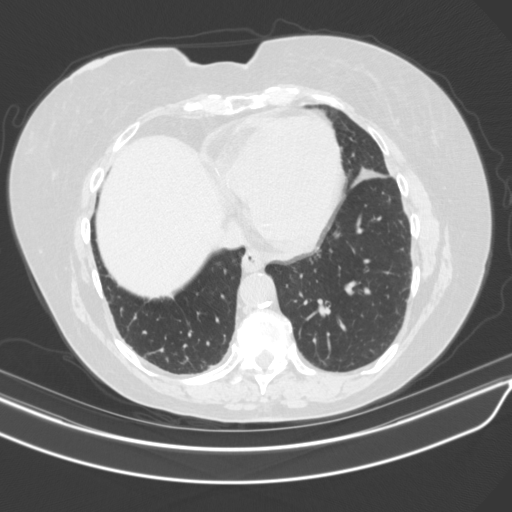
[im 68/152  lung]
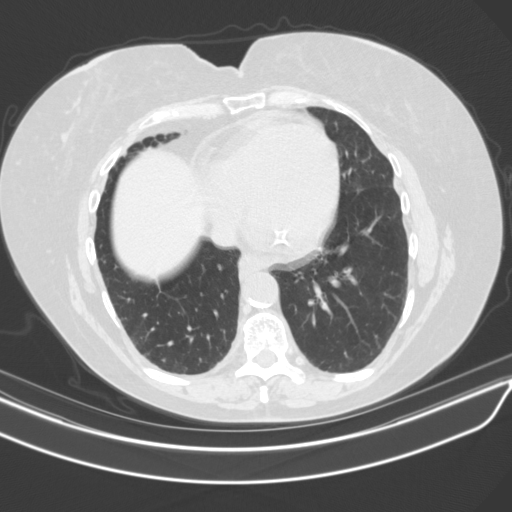
[im 79/152  lung]
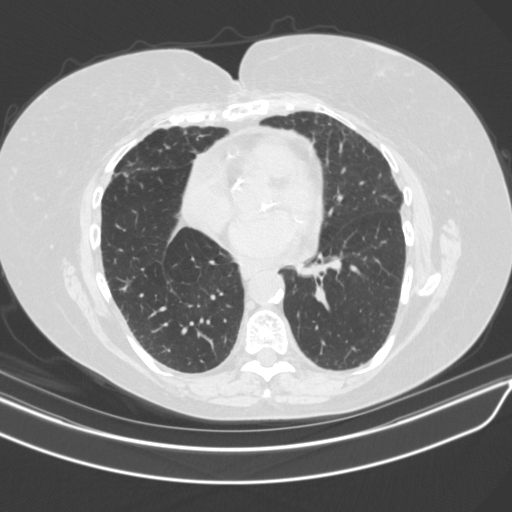
[im 84/152  mediastinal]
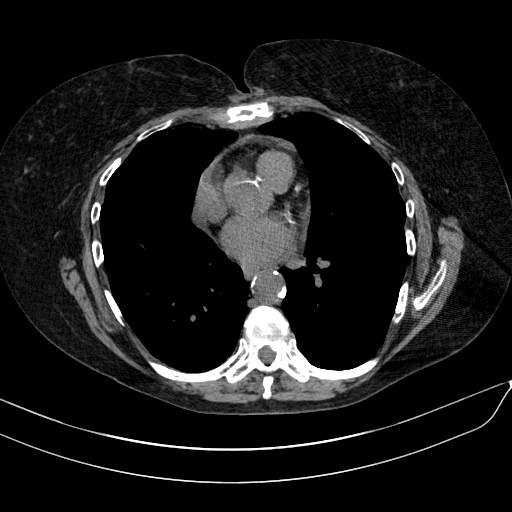
[im 84/152  lung]
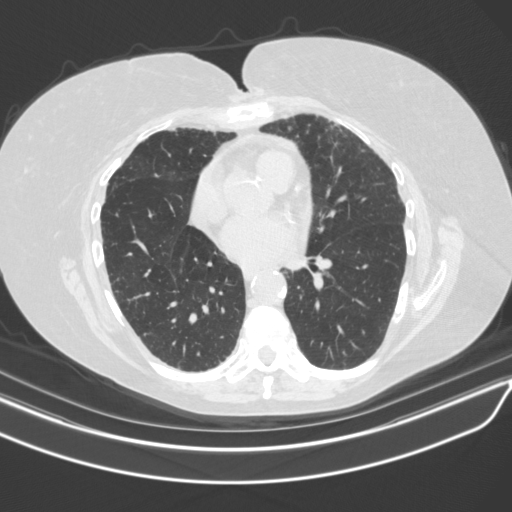
[im 91/152  lung]
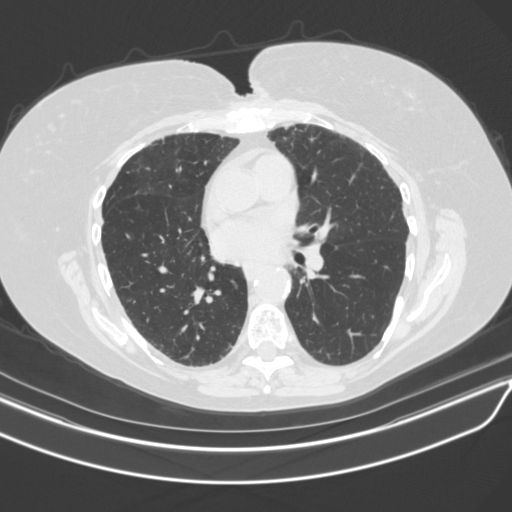
[im 101/152  lung]
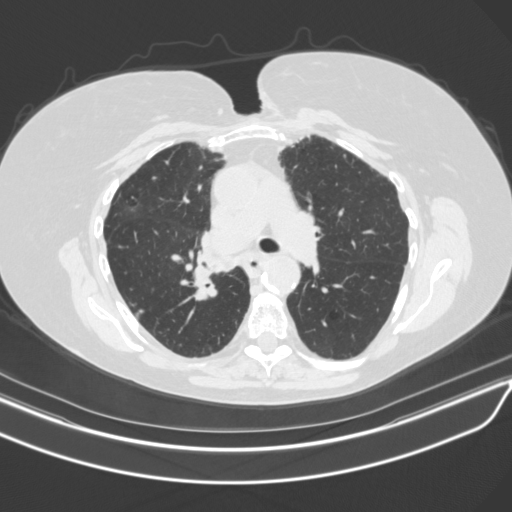
[im 112/152  lung]
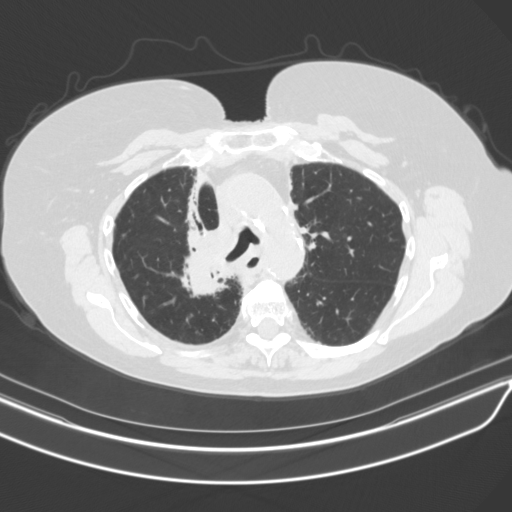
[im 121/152  mediastinal]
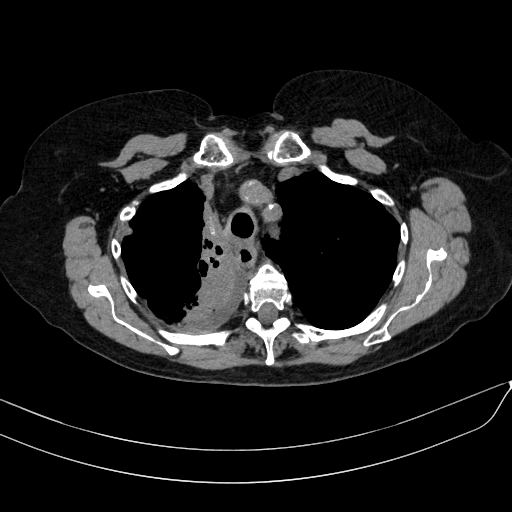
[im 121/152  lung]
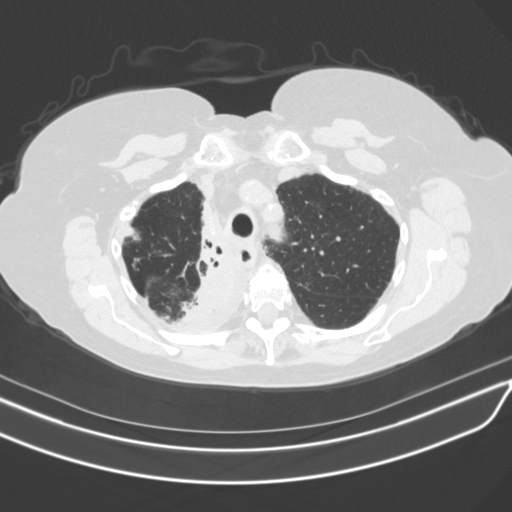
[im 129/152  lung]
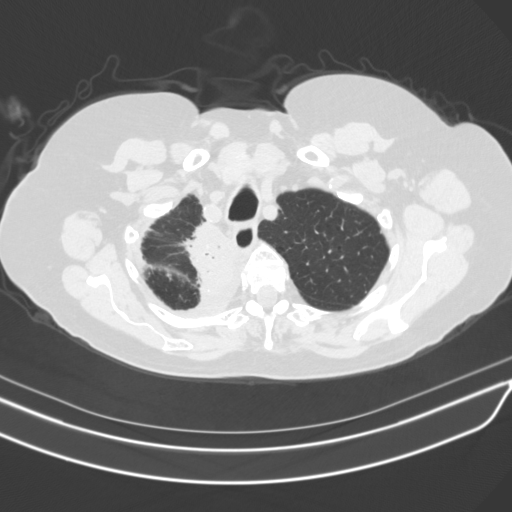
[im 140/152  lung]
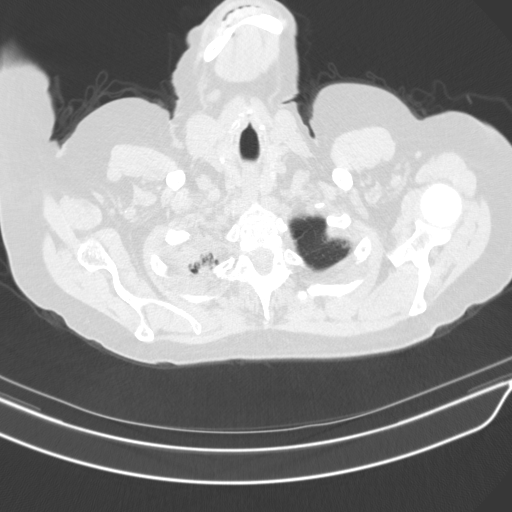

[15 of 34 positions shown; findings below may reference images not displayed]

FINDINGS: Cardiovascular: No acute findings. Aortic and coronary
atherosclerotic calcification noted.

Mediastinum/Nodes: No masses or pathologically enlarged lymph nodes
identified on this unenhanced exam.

Lungs/Pleura: Stable paramediastinal radiation changes are seen in
the upper right lung. No solid pulmonary nodules or masses are seen
in either lung. A new ground-glass nodule is seen in the anterior
left upper lobe which measures 11 x 9 mm on image [DATE], which was
not seen in 9775. No evidence of pulmonary infiltrate or pleural
effusion.

Upper Abdomen:  Unremarkable.

Musculoskeletal: No suspicious bone lesions. A few old left rib
fracture deformities are again noted.
IMPRESSION: Stable paramediastinal radiation changes in upper right lung. No
evidence of recurrent right lung carcinoma or metastatic disease.

New 11 mm ground-glass nodule in anterior left upper lobe. Follow-up
with CT at 12 months is recommended to to assess stability. If
persistent, repeat CT is recommended every 2 years until 5 years of
stability has been established. This recommendation follows the
consensus statement: Guidelines for Management of Incidental
Pulmonary Nodules Detected on CT Images: From the [HOSPITAL]

Aortic Atherosclerosis ([id number]-7ER.R).

## 2022-07-14 ENCOUNTER — Emergency Department (HOSPITAL_COMMUNITY): Payer: Medicare Other

## 2022-07-14 ENCOUNTER — Encounter (HOSPITAL_COMMUNITY): Payer: Self-pay

## 2022-07-14 ENCOUNTER — Emergency Department (HOSPITAL_COMMUNITY)
Admission: EM | Admit: 2022-07-14 | Discharge: 2022-07-14 | Disposition: A | Discharge status: Home / Self Care | Payer: Medicare Other | Attending: Emergency Medicine | Admitting: Emergency Medicine

## 2022-07-14 ENCOUNTER — Other Ambulatory Visit: Payer: Self-pay

## 2022-07-14 ENCOUNTER — Telehealth: Payer: Self-pay

## 2022-07-14 DIAGNOSIS — Z7982 Long term (current) use of aspirin: Secondary | ICD-10-CM | POA: Insufficient documentation

## 2022-07-14 DIAGNOSIS — J449 Chronic obstructive pulmonary disease, unspecified: Secondary | ICD-10-CM | POA: Insufficient documentation

## 2022-07-14 DIAGNOSIS — Z794 Long term (current) use of insulin: Secondary | ICD-10-CM | POA: Insufficient documentation

## 2022-07-14 DIAGNOSIS — Z20822 Contact with and (suspected) exposure to covid-19: Secondary | ICD-10-CM | POA: Insufficient documentation

## 2022-07-14 DIAGNOSIS — N3000 Acute cystitis without hematuria: Secondary | ICD-10-CM | POA: Diagnosis not present

## 2022-07-14 DIAGNOSIS — E119 Type 2 diabetes mellitus without complications: Secondary | ICD-10-CM | POA: Insufficient documentation

## 2022-07-14 DIAGNOSIS — R531 Weakness: Secondary | ICD-10-CM | POA: Insufficient documentation

## 2022-07-14 DIAGNOSIS — R6889 Other general symptoms and signs: Secondary | ICD-10-CM | POA: Diagnosis not present

## 2022-07-14 DIAGNOSIS — R5383 Other fatigue: Secondary | ICD-10-CM | POA: Diagnosis present

## 2022-07-14 DIAGNOSIS — Z743 Need for continuous supervision: Secondary | ICD-10-CM | POA: Diagnosis not present

## 2022-07-14 LAB — COMPREHENSIVE METABOLIC PANEL
ALT: 12 U/L (ref 0–44)
AST: 31 U/L (ref 15–41)
Albumin: 3.8 g/dL (ref 3.5–5.0)
Alkaline Phosphatase: 74 U/L (ref 38–126)
Anion gap: 6 (ref 5–15)
BUN: 17 mg/dL (ref 8–23)
CO2: 27 mmol/L (ref 22–32)
Calcium: 9.6 mg/dL (ref 8.9–10.3)
Chloride: 107 mmol/L (ref 98–111)
Creatinine, Ser: 1.13 mg/dL — ABNORMAL HIGH (ref 0.44–1.00)
GFR, Estimated: 51 mL/min — ABNORMAL LOW (ref >60)
Glucose, Bld: 153 mg/dL — ABNORMAL HIGH (ref 70–99)
Potassium: 4.6 mmol/L (ref 3.5–5.1)
Sodium: 140 mmol/L (ref 135–145)
Total Bilirubin: 0.5 mg/dL (ref 0.3–1.2)
Total Protein: 7.2 g/dL (ref 6.5–8.1)

## 2022-07-14 LAB — CBC WITH DIFFERENTIAL/PLATELET
Abs Immature Granulocytes: 0.04 10*3/uL (ref 0.00–0.07)
Basophils Absolute: 0 10*3/uL (ref 0.0–0.1)
Basophils Relative: 0 %
Eosinophils Absolute: 0.2 10*3/uL (ref 0.0–0.5)
Eosinophils Relative: 2 %
HCT: 41.1 % (ref 36.0–46.0)
Hemoglobin: 12.9 g/dL (ref 12.0–15.0)
Immature Granulocytes: 0 %
Lymphocytes Relative: 18 %
Lymphs Abs: 1.7 10*3/uL (ref 0.7–4.0)
MCH: 28.6 pg (ref 26.0–34.0)
MCHC: 31.4 g/dL (ref 30.0–36.0)
MCV: 91.1 fL (ref 80.0–100.0)
Monocytes Absolute: 0.7 10*3/uL (ref 0.1–1.0)
Monocytes Relative: 8 %
Neutro Abs: 6.7 10*3/uL (ref 1.7–7.7)
Neutrophils Relative %: 72 %
Platelets: 235 10*3/uL (ref 150–400)
RBC: 4.51 MIL/uL (ref 3.87–5.11)
RDW: 14.6 % (ref 11.5–15.5)
WBC: 9.4 10*3/uL (ref 4.0–10.5)
nRBC: 0 % (ref 0.0–0.2)

## 2022-07-14 LAB — URINALYSIS, ROUTINE W REFLEX MICROSCOPIC
Bilirubin Urine: NEGATIVE
Glucose, UA: NEGATIVE mg/dL
Hgb urine dipstick: NEGATIVE
Ketones, ur: NEGATIVE mg/dL
Nitrite: POSITIVE — AB
Protein, ur: NEGATIVE mg/dL
Specific Gravity, Urine: 1.014 (ref 1.005–1.030)
WBC, UA: >50 WBC/hpf — ABNORMAL HIGH (ref 0–5)
pH: 6 (ref 5.0–8.0)

## 2022-07-14 LAB — RESP PANEL BY RT-PCR (FLU A&B, COVID) ARPGX2
Influenza A by PCR: NEGATIVE
Influenza B by PCR: NEGATIVE
SARS Coronavirus 2 by RT PCR: NEGATIVE

## 2022-07-14 LAB — MAGNESIUM: Magnesium: 2 mg/dL (ref 1.7–2.4)

## 2022-07-14 MED ORDER — CEPHALEXIN 500 MG PO CAPS: 500.0000 mg | ORAL_CAPSULE | Freq: Four times a day (QID) | ORAL | 0 refills | Status: DC

## 2022-07-14 MED ORDER — SODIUM CHLORIDE 0.9 % IV BOLUS
1000.0000 mL | Freq: Once | INTRAVENOUS | Status: AC
  Administered 2022-07-14: 1000 mL via INTRAVENOUS

## 2022-07-14 MED ORDER — SODIUM CHLORIDE 0.9 % IV SOLN
2.0000 g | Freq: Once | INTRAVENOUS | Status: AC
  Administered 2022-07-14: 2 g via INTRAVENOUS
  Filled 2022-07-14: qty 20

## 2022-07-14 NOTE — ED Triage Notes (Addendum)
Per EMS- Patient c/o weakness and fatigue since waking at 1000 today.  Patient received NS 250 ml prior to EMS arrival.

## 2022-07-14 NOTE — Telephone Encounter (Signed)
Pt in ED   [facility] REC[facility] Patient Name: [name] Gender: Female DOB: Mar 13, 1946 Age: 76 Y 23 D Return Phone Number: [phone] (Primary) Address: City/ State/ Zip: [place]  [address] Client [facility] - Client Client Site [facility] - Day Provider Crissie Sickles - MD Contact Type Call Who Is Calling Patient / Member / Family / Caregiver Call Type Triage / Clinical Caller Name Delores Royster Relationship To Patient Daughter Return Phone Number [phone] (Primary) Chief Complaint BLOOD PRESSURE LOW - Systolic (top number) 90 or less Reason for Call Symptomatic / Request for Micro says her mom feels like she is about to pass out blood pressure 79/64. Translation No Nurse Assessment Nurse: Lenon Curt, RN, Melanie Date/Time (Eastern Time): 07/14/2022 11:01:58 AM Confirm and document reason for call. If symptomatic, describe symptoms. ---Caller states mom feels like about to pass out, BP 79/64. Caller not with mom. Spoke to son about 10 minutes ago, giving salty chips. Stopped BP meds over a month ago, MD aware of still has dizzy spells when standing up. Does the patient have any new or worsening symptoms? ---Yes Will a triage be completed? ---Yes Related visit to physician within the last 2 weeks? ---Yes Does the PT have any chronic conditions? (i.e. diabetes, asthma, this includes High risk factors for pregnancy, etc.) ---Yes List chronic conditions. ---DM, Calcium, MVI, Buproprion Is this a behavioral health or substance abuse call? ---No Guidelines Guideline Title Affirmed Question Affirmed Notes Nurse Date/Time (Eastern Time) Blood Pressure - Low [8] Fall in systolic BP > 20 mm Hg from normal AND [2] dizzy, lightheaded, or weak Lenon Curt, RN, Threasa Beards 07/14/2022 11:04:07 AM PLEASE NOTE: All timestamps  contained within this report are represented as [place] Standard Time. CONFIDENTIALTY NOTICE: This fax transmission is intended only for the addressee. It contains information that is legally privileged, confidential or otherwise protected from use or disclosure. If you are not the intended recipient, you are strictly prohibited from reviewing, disclosing, copying using or disseminating any of this information or taking any action in reliance on or regarding this information. If you have received this fax in error, please notify us immediately by telephone so that we can arrange for its return to [place]. Phone: [phone], Toll-Free: [phone], Fax: [phone] Page: 2 of 2 Call Id: [id number] [place]. Time Eilene Ghazi Time) Disposition Final User 07/14/2022 11:00:06 AM Send to Urgent Queue Bettye Boeck 07/14/2022 11:08:00 AM Go to ED Now (or PCP triage) Yes Lenon Curt, RN, Melanie Final Disposition 07/14/2022 11:08:00 AM Go to ED Now (or PCP triage) Yes Lenon Curt, RN, Donnajean Lopes Disagree/Comply Comply Caller Understands Yes PreDisposition Call Doctor Care Advice Given Per Guideline GO TO ED NOW (OR PCP TRIAGE): * IF NO PCP (PRIMARY CARE PROVIDER) SECOND-LEVEL TRIAGE: You need to be seen within the next hour. Go to the [facility] at _____________ [place] as soon as you can. ANOTHER ADULT SHOULD DRIVE: * It is better and safer if another adult drives instead of you. CARE ADVICE given per Low Blood Pressure (Adult) guideline. Comments User: Erik Obey, RN Date/Time Eilene Ghazi Time): 07/14/2022 11:06:25 AM 92/74, 41 just now User: Erik Obey, RN Date/Time Eilene Ghazi Time): 07/14/2022 11:09:51 AM Feeling better with sitting, eating slaty chips and drinking water. BP normally 120/60s, not sure about the heart rate being that low. Referrals [facility] - ED

## 2022-07-14 NOTE — Discharge Instructions (Signed)
Follow-up with your doctor next week for recheck 

## 2022-07-14 NOTE — ED Provider Notes (Signed)
[facility] DEPT Provider Note   CSN: [id number] Arrival date & time: 07/14/22  1154     History  Chief Complaint  Patient presents with   Weakness   Fatigue    [name] is a 76 y.o. female.  Patient complains of weakness.  Patient has a history of diabetes COPD  The history is provided by the patient and medical records. No language interpreter was used.  Weakness Severity:  Moderate Onset quality:  Sudden Timing:  Intermittent Chronicity:  Recurrent Context: not alcohol use   Relieved by:  Nothing Worsened by:  Nothing Ineffective treatments:  None tried Associated symptoms: no abdominal pain, no chest pain, no cough, no diarrhea, no frequency, no headaches and no seizures        Home Medications Prior to Admission medications   Medication Sig Start Date End Date Taking? Authorizing Provider  cephALEXin (KEFLEX) 500 MG capsule Take 1 capsule (500 mg total) by mouth 4 (four) times daily. 07/14/22  Yes Milton Ferguson, MD  albuterol (PROVENTIL) (2.5 MG/3ML) 0.083% nebulizer solution USE 1 VIAL IN NEBULIZER EVERY 4 HOURS AS NEEDED FOR WHEEZING AND FOR SHORTNESS OF BREATH Patient taking differently: Take 2.5 mg by nebulization every 4 (four) hours as needed for wheezing or shortness of breath. 02/12/21   McGowen, [name], MD  albuterol (PROVENTIL) (5 MG/ML) 0.5% nebulizer solution Take 1 mL (5 mg total) by nebulization every 6 (six) hours as needed for wheezing or shortness of breath. 03/06/22   Lacretia Leigh, MD  albuterol (VENTOLIN HFA) 108 (90 Base) MCG/ACT inhaler INHALE 2 PUFFS BY MOUTH EVERY 6 HOURS AS NEEDED FOR WHEEZING OR SHORTNESS OF BREATH Patient taking differently: 2 puffs every 6 (six) hours as needed for wheezing. 06/05/20   McGowen, [name], MD  aspirin 81 MG EC tablet Take 81 mg by mouth daily.     [provider]  atorvastatin (LIPITOR) 40 MG tablet Take 1 tablet (40 mg total) by mouth daily. Patient taking  differently: Take 40 mg by mouth every evening. 02/02/22   McGowen, [name], MD  blood glucose meter kit and supplies Use to check blood sugars 1-2 times daily. 06/28/22   [name], MD  buPROPion (WELLBUTRIN XL) 150 MG 24 hr tablet Take 1 tablet by mouth once daily 06/30/22   McGowen, [name], MD  Calcium Carb-Cholecalciferol (CALCIUM + VITAMIN D3 PO) Take 1-2 tablets by mouth See admin instructions. 1 tablet in the morning, 2 tablets in the evening    [provider]  cetirizine (ZYRTEC) 10 MG tablet Take 10 mg by mouth every evening.    [provider]  ibandronate (BONIVA) 150 MG tablet Take 1 tablet (150 mg total) by mouth every 30 (thirty) days. Take in the morning with a full glass of water, on an empty stomach, and do not take anything else by mouth or lie down for the next 30 min. Patient taking differently: Take 150 mg by mouth every 30 (thirty) days. 05/25/21   McGowen, [name], MD  insulin isophane & regular human KwikPen (HUMULIN 70/30 KWIKPEN) (70-30) 100 UNIT/ML KwikPen INJECT 4 UNITS SUBCUTANEOUSLY WITH BREAKFAST AND 2 UNITS WITH SUPPER Patient taking differently: Inject 4 Units into the skin 2 (two) times daily. 02/02/22   McGowen, [name], MD  Lancets V [facility] DELICA PLUS [id number]) MISC Apply topically 3 (three) times daily. 06/17/22   [provider]  Multiple Vitamins-Minerals (MULTIVITAMIN WOMEN PO) Take 1 tablet by mouth daily.  [provider]  pantoprazole (PROTONIX) 40 MG tablet Take 1 tablet (40 mg total) by mouth daily. 02/02/22   McGowen, Philip H, MD      Allergies    Lactose intolerance (gi) and Motrin [ibuprofen]    Review of Systems   Review of Systems  Constitutional:  Negative for appetite change and fatigue.  HENT:  Negative for congestion, ear discharge and sinus pressure.   Eyes:  Negative for discharge.  Respiratory:  Negative for cough.   Cardiovascular:  Negative for chest pain.  Gastrointestinal:  Negative for  abdominal pain and diarrhea.  Genitourinary:  Negative for frequency and hematuria.  Musculoskeletal:  Negative for back pain.  Skin:  Negative for rash.  Neurological:  Positive for weakness. Negative for seizures and headaches.  Psychiatric/Behavioral:  Negative for hallucinations.     Physical Exam Updated Vital Signs BP (!) 148/58 (BP Location: Right Arm)   Pulse 93   Temp (!) 97.5 F (36.4 C) (Oral)   Resp 18   Ht 5' (1.524 m)   Wt 74.8 kg   SpO2 98%   BMI 32.22 kg/m  Physical Exam Vitals and nursing note reviewed.  Constitutional:      Appearance: She is well-developed.  HENT:     Head: Normocephalic.     Nose: Nose normal.  Eyes:     General: No scleral icterus.    Conjunctiva/sclera: Conjunctivae normal.  Neck:     Thyroid: No thyromegaly.  Cardiovascular:     Rate and Rhythm: Normal rate and regular rhythm.     Heart sounds: No murmur heard.    No friction rub. No gallop.  Pulmonary:     Breath sounds: No stridor. No wheezing or rales.  Chest:     Chest wall: No tenderness.  Abdominal:     General: There is no distension.     Tenderness: There is no abdominal tenderness. There is no rebound.  Musculoskeletal:        General: Normal range of motion.     Cervical back: Neck supple.  Lymphadenopathy:     Cervical: No cervical adenopathy.  Skin:    Findings: No erythema or rash.  Neurological:     Mental Status: She is alert and oriented to person, place, and time.     Motor: No abnormal muscle tone.     Coordination: Coordination normal.  Psychiatric:        Behavior: Behavior normal.     ED Results / Procedures / Treatments   Labs (all labs ordered are listed, but only abnormal results are displayed) Labs Reviewed  COMPREHENSIVE METABOLIC PANEL - Abnormal; Notable for the following components:      Result Value   Glucose, Bld 153 (*)    Creatinine, Ser 1.13 (*)    GFR, Estimated 51 (*)    All other components within normal limits   URINALYSIS, ROUTINE W REFLEX MICROSCOPIC - Abnormal; Notable for the following components:   APPearance CLOUDY (*)    Nitrite POSITIVE (*)    Leukocytes,Ua LARGE (*)    WBC, UA >50 (*)    Bacteria, UA MANY (*)    All other components within normal limits  RESP PANEL BY RT-PCR (FLU A&B, COVID) [id number]  URINE CULTURE  CBC WITH DIFFERENTIAL/PLATELET  MAGNESIUM    EKG None  Radiology DG Chest 2 View  Result Date: 07/14/2022 CLINICAL DATA:  weakness. evaluate for PNA EXAM: CHEST - 2 VIEW COMPARISON:  Chest x-ray 05/23/2022. FINDINGS: No substantial   change in masslike opacities in the right upper lobe. No new opacities. No visible pleural effusions or pneumothorax. Cardiomediastinal silhouette is unchanged. Right-sided volume loss, chronic. IMPRESSION: 1. No substantial change in masslike opacities in the right upper lobe. CT of the chest (preferably with contrast) could further evaluate if clinically warranted. 2. No new opacities. Electronically Signed   By: Frederick S Jones M.D.   On: 07/14/2022 13:23    Procedures Procedures    Medications Ordered in ED Medications  sodium chloride 0.9 % bolus 1,000 mL (0 mLs Intravenous Stopped 07/14/22 1726)  cefTRIAXone (ROCEPHIN) 2 g in sodium chloride 0.9 % 100 mL IVPB (0 g Intravenous Stopped 07/14/22 1838)    ED Course/ Medical Decision Making/ A&P                           Medical Decision Making Amount and/or Complexity of Data Reviewed Labs: ordered.  Risk Prescription drug management.  This patient presents to the ED for concern of fatigue, this involves an extensive number of treatment options, and is a complaint that carries with it a high risk of complications and morbidity.  The differential diagnosis includes sepsis, anemia   Co morbidities that complicate the patient evaluation  COPD, diabetes   Additional history obtained:  Additional history obtained from the patient External records from outside source obtained  and reviewed including hospital records   Lab Tests:  I Ordered, and personally interpreted labs.  The pertinent results include: Urinalysis shows urinary tract infection CBC and chemistries unremarkable except for glucose 153   Imaging Studies ordered:  I ordered imaging studies including chest x-ray I independently visualized and interpreted imaging which showed no acute disease I agree with the radiologist interpretation   Cardiac Monitoring: / EKG:  The patient was maintained on a cardiac monitor.  I personally viewed and interpreted the cardiac monitored which showed an underlying rhythm of: Normal sinus rhythm   Consultations Obtained:  No consultant  Problem List / ED Course / Critical interventions / Medication management  Weakness and UTI I ordered medication including Rocephin for UTI Reevaluation of the patient after these medicines showed that the patient improved I have reviewed the patients home medicines and have made adjustments as needed   Social Determinants of Health:  None   Test / Admission - Considered:  No other test considered  Patient nontoxic with UTI.  She is given Rocephin in the emergency department and Keflex as an outpatient will follow-up with PCP        Final Clinical Impression(s) / ED Diagnoses Final diagnoses:  Acute cystitis without hematuria    Rx / DC Orders ED Discharge Orders          Ordered    cephALEXin (KEFLEX) 500 MG capsule  4 times daily        07/14/22 1911              , , MD 07/16/22 1147  

## 2022-07-14 NOTE — ED Provider Triage Note (Signed)
Emergency Medicine Provider Triage Evaluation Note  Saia Derossett , a 76 y.o. female  was evaluated in triage.  Pt complains of feeling weak since this morning.  Denies chest pain.  Denies shortness of breath.  No focal neurological deficits on exam.  Coarse lung sounds on auscultation.  Review of Systems  Positive: As above Negative: As above  Physical Exam  BP (!) 130/49 (BP Location: Left Arm)   Pulse 94   Temp (!) 97.5 F (36.4 C) (Oral)   Resp 16   Ht 5' (1.524 m)   Wt 74.8 kg   SpO2 98%   BMI 32.22 kg/m  Gen:   Awake, no distress   Resp:  Normal effort  MSK:   Moves extremities without difficulty  Other:    Medical Decision Making  Medically screening exam initiated at 1:06 PM.  Appropriate orders placed.  [name] was informed that the remainder of the evaluation will be completed by another provider, this initial triage assessment does not replace that evaluation, and the importance of remaining in the ED until their evaluation is complete.     Evlyn Courier, PA-C 07/14/22 1307

## 2022-07-16 LAB — URINE CULTURE: Culture: >=100000 — AB

## 2022-07-17 ENCOUNTER — Telehealth (HOSPITAL_BASED_OUTPATIENT_CLINIC_OR_DEPARTMENT_OTHER): Payer: Self-pay | Admitting: *Deleted

## 2022-07-17 NOTE — Telephone Encounter (Signed)
Post ED Visit - Positive Culture Follow-up  Culture report reviewed by antimicrobial stewardship pharmacist: [place] Team []  Nathan Batchelder, Pharm.D. []  [place], Pharm.D., BCPS AQ-ID []  Heide Guile, Pharm.D., BCPS []  Parks Neptune, Pharm.D., BCPS []  [place], [place].D., BCPS, AAHIVP []  [place], Pharm.D., BCPS, AAHIVP []  Legrand Como, PharmD, BCPS []  Salome Arnt, PharmD, BCPS []  Johnnette Gourd, PharmD, BCPS []  Hughes Better, PharmD []  Leeroy Cha, PharmD, BCPS []  Laqueta Linden, PharmD  [place] Team []  [address], PharmD []  Leodis Sias, PharmD []  Lindell Spar, PharmD []  Royetta Asal, Rph []  Graylin Shiver) Rema Fendt, PharmD [x]  Glennon Mac, PharmD []  Arlyn Dunning, PharmD []  Netta Cedars, PharmD []  Dia Sitter, PharmD []  Leone Haven, PharmD []  Gretta Arab, PharmD []  Theodis Shove, PharmD []  Peggyann Juba, PharmD   Positive urine culture Treated with Cephalexin, organism sensitive to the same and no further patient follow-up is required at this time.  Reuel Boom 07/17/2022, 11:10 AM

## 2022-08-01 NOTE — Progress Notes (Signed)
[facility] Quality Team Note  Name: Kamarie Veno Date of Birth: 1945-11-05 MRN: [id number] Date: 08/01/2022  [facility] Quality Team has reviewed this patient's chart, please see recommendations below:  [facility] Quality Other; (KED: Kidney Health Evaluation Gap- Patient needs Urine Albumin Creatinine Ratio Test completed for gap closure. EGFR has already been completed, Patient has upcoming appointment with [place] 08/04/2022.)

## 2022-08-04 ENCOUNTER — Encounter: Payer: Self-pay | Admitting: Family Medicine

## 2022-08-04 ENCOUNTER — Ambulatory Visit (INDEPENDENT_AMBULATORY_CARE_PROVIDER_SITE_OTHER): Payer: Medicare Other | Admitting: Family Medicine

## 2022-08-04 VITALS — BP 105/69 | HR 109 | Temp 98.4°F | Ht 60.0 in | Wt 140.6 lb

## 2022-08-04 DIAGNOSIS — F330 Major depressive disorder, recurrent, mild: Secondary | ICD-10-CM

## 2022-08-04 DIAGNOSIS — Z794 Long term (current) use of insulin: Secondary | ICD-10-CM | POA: Diagnosis not present

## 2022-08-04 DIAGNOSIS — J449 Chronic obstructive pulmonary disease, unspecified: Secondary | ICD-10-CM

## 2022-08-04 DIAGNOSIS — R5382 Chronic fatigue, unspecified: Secondary | ICD-10-CM

## 2022-08-04 DIAGNOSIS — I10 Essential (primary) hypertension: Secondary | ICD-10-CM

## 2022-08-04 DIAGNOSIS — E7800 Pure hypercholesterolemia, unspecified: Secondary | ICD-10-CM | POA: Diagnosis not present

## 2022-08-04 DIAGNOSIS — R42 Dizziness and giddiness: Secondary | ICD-10-CM | POA: Diagnosis not present

## 2022-08-04 DIAGNOSIS — E118 Type 2 diabetes mellitus with unspecified complications: Secondary | ICD-10-CM | POA: Diagnosis not present

## 2022-08-04 DIAGNOSIS — J441 Chronic obstructive pulmonary disease with (acute) exacerbation: Secondary | ICD-10-CM | POA: Diagnosis not present

## 2022-08-04 DIAGNOSIS — Z23 Encounter for immunization: Secondary | ICD-10-CM | POA: Diagnosis not present

## 2022-08-04 LAB — POCT GLYCOSYLATED HEMOGLOBIN (HGB A1C)
HbA1c POC (<> result, manual entry): 7.3 % (ref 4.0–5.6)
HbA1c, POC (controlled diabetic range): 7.3 % — AB (ref 0.0–7.0)
HbA1c, POC (prediabetic range): 7.3 % — AB (ref 5.7–6.4)
Hemoglobin A1C: 7.3 % — AB (ref 4.0–5.6)

## 2022-08-04 MED ORDER — IBANDRONATE SODIUM 150 MG PO TABS: 150.0000 mg | ORAL_TABLET | ORAL | 3 refills | Status: DC

## 2022-08-04 MED ORDER — PANTOPRAZOLE SODIUM 40 MG PO TBEC: 40.0000 mg | DELAYED_RELEASE_TABLET | Freq: Every day | ORAL | 1 refills | Status: DC

## 2022-08-04 MED ORDER — BUDESONIDE-FORMOTEROL FUMARATE 160-4.5 MCG/ACT IN AERO: 2.0000 | INHALATION_SPRAY | Freq: Two times a day (BID) | RESPIRATORY_TRACT | 6 refills | Status: DC

## 2022-08-04 MED ORDER — BUPROPION HCL ER (XL) 300 MG PO TB24: 300.0000 mg | ORAL_TABLET | Freq: Every day | ORAL | 0 refills | Status: DC

## 2022-08-04 MED ORDER — AEROCHAMBER MV MISC: 2 refills | Status: DC

## 2022-08-04 MED ORDER — PREDNISONE 20 MG PO TABS: 20.0000 mg | ORAL_TABLET | Freq: Every day | ORAL | 0 refills | Status: DC

## 2022-08-04 MED ORDER — ATORVASTATIN CALCIUM 40 MG PO TABS: 40.0000 mg | ORAL_TABLET | Freq: Every day | ORAL | 1 refills | Status: DC

## 2022-08-04 NOTE — Progress Notes (Signed)
OFFICE VISIT  08/15/2022  CC:  Chief Complaint  Patient presents with   Hypertension   Diabetes   Chronic Kidney Disease   Patient is a 76 y.o. female who presents accompanied by her daughter [name] for f/u DM, HTN, CRI III.  INTERIM HX: Seems to be more depressed lately. Not as motivated to get up and around lately.  No HI or SI. She does have increased cough and wheeze last several days.  Today she used her albuterol twice.  Chronic mild orthostatic dizziness is stable.    Labs all stable 07/14/22 at ED visit for UTI. She was given Rocephin and discharged home with Keflex. Urine culture grew E. coli, pansensitive.   Insulin 70/30, 4 & 4.   ROS as above, plus--> no fevers, no CP, no HAs, no rashes, no melena/hematochezia.  No polyuria or polydipsia.  No myalgias or arthralgias.  No focal weakness, paresthesias, or tremors.  No acute vision or hearing abnormalities.  No dysuria or unusual/new urinary urgency or frequency.  No recent changes in lower legs. No n/v/d or abd pain.  No palpitations.    Past Medical History:  Diagnosis Date   Acute renal failure (HCC)    Age-related nuclear cataract of both eyes 2016   +cortical age related cataracts OU    Anxiety and depression    Bilateral diabetic retinopathy ([place]) 2015   Dr. 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   Colonic diverticular abscess    COPD (chronic obstructive pulmonary disease) (HCC)    CVA (cerebral vascular accident) ([place]) 09/2016   MRI did show a left-sided small ischemic stroke which was acute but likely incidental (MRI was done b/c pt had TIA sx's in hospital).  Neuro put her on plavix at that time.  Cardiology d/c'd her plavix 08/2017.   Diabetes mellitus with complication (HCC)    diab retinopathy OU (laser)    Diverticulitis of large intestine with perforation and abscess 09/11/2016   Finger fracture, right 04/2020   R 5th metacarpal fx at the base (Dr. Mardelle Matte)   GERD (gastroesophageal reflux disease)    protonix   History of concussion 08/25/2018   w/out loss of consciousness--mild increase in baseline memory impairment after.  CT head neg acute.   History of diverticulitis of colon    with abscess; required IR percutaneous drain placed 09/2016.   History of iron deficiency anemia    started iron 07/02/19.  EGD/colonoscopy 08/27/19 unrevealing. Capsule endoscopy considered but never done.   Hyperlipidemia    Hypertension    Left bundle branch block (LBBB) 12/16/2016   Lung cancer ([place])    non-small cell lung ca, stage III in 05/2011; systemic chemotherapy concurrent with radiation followed by prophylactic cranial irradiation and has been observation since July of 2010 with no evidence for disease recurrence-released from onc f/u 12/2014 (needs annual cxr by PCP).  CXR 08/2015 stable.  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 Earlie Server did bx and this was NEG for malignancy.   Mild cognitive impairment with memory loss    Likely from brain radiation therapy   Non-obstructive CAD (coronary artery disease)    a. Cath 2006 preserved LV fxn, scattered irregularities without critical stenosis; b. 2008 stress echo negative for ischemia, but with hypertensive response; c. 08/2016 Cath: D1 25%, otw  nl.   Normocytic anemia 2018   Iron, vit B12, folate all normal 12/2016.   Open-angle glaucoma 2016   Dr. Shirley Muscat, (bilateral)---responding to topical therapy   Osteoarthritis of both hands    Osteoporosis 03/2016; 03/2018   03/2016 DEXA T-score -2.2.  03/2018 T-score L femoral neck  -2.7--boniva started. DEXA 04/2021 T scor -2.6->boniva continued. Plan rpt DEXA 2 yrs.   Pneumonia    hospitalization 11/2016; hypoxemic resp failure--d/c'd home with home oxygen therapy   Pulmonary nodule, left 08/2021   LUL 11 mm ground  glass, needs f/u CT 08/2022.   Seasonal allergic rhinitis    Subclinical hyperthyroidism 2018   Takotsubo cardiomyopathy    a. 08/2016 Echo: EF 30-35%, gr1DD, PASP 22mHg;  b. 08/2016 Cath: nonobs Dzs;  c. 11/2016 Echo: EF 40-45%, no rwma, nl RV fxn, mild TR, PASP 315mg.  ECHO 02/2017 EF 50-55%, grd II DD---essentially resolved Takotsubo 02/2017.   [place]   a. 08/2016 in setting of diverticulitis & pneumoperitoneum and Takotsubo CM -->prolonged QT, seen by EP-->avoid meds with potential for QT prolongation.    Past Surgical History:  Procedure Laterality Date   ABDOMINAL HYSTERECTOMY  1997   APPENDECTOMY     CARDIAC CATHETERIZATION N/A 09/14/2016   Procedure: Left Heart Cath and Coronary Angiography;  Surgeon: ThTroy SineMD;  [facility];  Service: Cardiovascular; Mild nonobstructive CAD with 85% smooth narrowing in the first diagonal branch of the LAD; 10% smooth narrowing of the ostial proximal left circumflex coronary artery; and a normal dominant RCA.   CARPAL TUNNEL RELEASE     CATARACT EXTRACTION Bilateral    CHOLECYSTECTOMY  2002   COLONOSCOPY  10/24/00; 08/27/19   2002 normal.  [employer] hemoccult testing via [facility] 06/18/15 was NEG.  2020->diverticulosis o/w nl.   dexa  03/2016; 03/2018   03/2018 T-score L femoral neck  -2.7 (worsened compared to osteopenic range 2017.) DEXA 05/03/21 stable T score -2.6 on boniva x 2 yrs->plan rpt DEXA 2 yrs.   ESOPHAGOGASTRODUODENOSCOPY  08/27/2019   Normal.   IR GENERIC HISTORICAL  09/19/2016   IR SINUS/FIST TUBE CHK-NON GI 09/19/2016 JaCorrie MckusickDO [facility]   IR GENERIC HISTORICAL  10/06/2016   IR RADIOLOGIST EVAL & MGMT 10/06/2016 [facility]   IR GENERIC HISTORICAL  10/26/2016   IR RADIOLOGIST EVAL & MGMT 10/26/2016 [facility]   IR GENERIC HISTORICAL  11/03/2016   IR RADIOLOGIST EVAL & MGMT 11/03/2016 WeArdis RowanPA-C [facility]   IR GENERIC HISTORICAL  11/24/2016    IR RADIOLOGIST EVAL & MGMT 11/24/2016 WeArdis RowanPA-C [facility]   IR GENERIC HISTORICAL  12/05/2016   Fistula smaller/improving.  IR SINUS/FIST TUBE CHK-NON GI 12/05/2016 JoSandi MariscalMD [facility]   IR GENERIC HISTORICAL  12/22/2016   IR RADIOLOGIST EVAL & MGMT 12/22/2016 MiGreggory KeenMD [facility]   IR RADIOLOGIST EVAL & MGMT  01/10/2017   IR RADIOLOGIST EVAL & MGMT  02/09/2017   IR SINUS/FIST TUBE CHK-NON GI  03/28/2017   LEFT HEART CATHETERIZATION WITH CORONARY ANGIOGRAM N/A 05/30/2012   Procedure: LEFT HEART CATHETERIZATION WITH CORONARY ANGIOGRAM;  Surgeon: ChBurnell BlanksMD;  [facility];  Service: Cardiovascular: Mild, non-obstructive CAD   TRANSTHORACIC ECHOCARDIOGRAM  09/14/2016; 11/2016   08/2016: EF 30-35 %, Akinesis of the mid-apicalanteroseptal myocardium.  Grade I DD.  Mild pulm HTN.  Repeat echo 11/2016, EF 40-45%, no rwma, nl  RV fxn, mild TR, PASP 82mHg.     TRANSTHORACIC ECHOCARDIOGRAM  03/07/2017   EF 50-55%. Hypokinesis of the distal septum with overall low normal LV,  systolic function; mild diastolic dysfunction with elevated LV  filling pressure; mildly calcified aortic valve with mild AI; small pericardial effusion    Outpatient Medications Prior to Visit  Medication Sig Dispense Refill   albuterol (PROVENTIL) (2.5 MG/3ML) 0.083% nebulizer solution USE 1 VIAL IN NEBULIZER EVERY 4 HOURS AS NEEDED FOR WHEEZING AND FOR SHORTNESS OF BREATH (Patient not taking: Reported on 08/10/2022) 90 mL 1   albuterol (VENTOLIN HFA) 108 (90 Base) MCG/ACT inhaler INHALE 2 PUFFS BY MOUTH EVERY 6 HOURS AS NEEDED FOR WHEEZING OR SHORTNESS OF BREATH (Patient not taking: Reported on 08/10/2022) 18 g 1   aspirin 81 MG EC tablet Take 81 mg by mouth daily.      Calcium Carb-Cholecalciferol (CALCIUM + VITAMIN D3 PO) Take 1-2 tablets by mouth See admin instructions. Take 1 tablet by mouth in the morning and 2 tablets by mouth at bedtime     cetirizine  (ZYRTEC) 10 MG tablet Take 10 mg by mouth every evening.     insulin isophane & regular human KwikPen (HUMULIN 70/30 KWIKPEN) (70-30) 100 UNIT/ML KwikPen INJECT 4 UNITS SUBCUTANEOUSLY WITH BREAKFAST AND 2 UNITS WITH SUPPER (Patient taking differently: Inject 4 Units into the skin 2 (two) times daily.) 15 mL 1   Multiple Vitamins-Minerals (MULTIVITAMIN WOMEN PO) Take 1 tablet by mouth daily.     blood glucose meter kit and supplies Use to check blood sugars 1-2 times daily. 1 each 0   buPROPion (WELLBUTRIN XL) 150 MG 24 hr tablet Take 1 tablet by mouth once daily 90 tablet 0   cephALEXin (KEFLEX) 500 MG capsule Take 1 capsule (500 mg total) by mouth 4 (four) times daily. 28 capsule 0   Lancets (ONETOUCH DELICA PLUS [id number] MISC Apply topically 3 (three) times daily.     albuterol (PROVENTIL) (5 MG/ML) 0.5% nebulizer solution Take 1 mL (5 mg total) by nebulization every 6 (six) hours as needed for wheezing or shortness of breath. (Patient not taking: Reported on 08/04/2022) 20 mL 2   atorvastatin (LIPITOR) 40 MG tablet Take 1 tablet (40 mg total) by mouth daily. (Patient taking differently: Take 40 mg by mouth every evening.) 90 tablet 1   ibandronate (BONIVA) 150 MG tablet Take 1 tablet (150 mg total) by mouth every 30 (thirty) days. Take in the morning with a full glass of water, on an empty stomach, and do not take anything else by mouth or lie down for the next 30 min. (Patient taking differently: Take 150 mg by mouth every 30 (thirty) days.) 3 tablet 3   pantoprazole (PROTONIX) 40 MG tablet Take 1 tablet (40 mg total) by mouth daily. 90 tablet 1   No facility-administered medications prior to visit.    Allergies  Allergen Reactions   Lactose Intolerance (Gi) Diarrhea   Motrin [Ibuprofen] Itching, Anxiety and Other (See Comments)    Anxiousness Hyperventilates     ROS As per HPI  PE:    08/13/2022    1:42 PM 08/13/2022    5:20 AM 08/12/2022    9:12 PM  Vitals with BMI  Systolic  [id number] Diastolic 54 55 53  Pulse 97 86 93     Physical Exam  Gen: Alert, well appearing.  Patient is oriented to person, place, time, and situation. AFFECT: pleasant, lucid thought and speech. CV: RRR,  no murm LUNGS: bilat diffuse exh wheezing and mild dec aeration. Non-labored. EXT: no edema or cyanosis.  LABS:  Last CBC Lab Results  Component Value Date   WBC 8.9 08/13/2022   HGB 11.3 (L) 08/13/2022   HCT 35.7 (L) 08/13/2022   MCV 89.3 08/13/2022   MCH 28.3 08/13/2022   RDW 15.1 08/13/2022   PLT 208 42/70/6237   Last metabolic panel Lab Results  Component Value Date   GLUCOSE 116 (H) 08/13/2022   NA 139 08/13/2022   K 4.2 08/13/2022   CL 105 08/13/2022   CO2 28 08/13/2022   BUN 25 (H) 08/13/2022   CREATININE 0.97 08/13/2022   GFRNONAA >60 08/13/2022   CALCIUM 9.6 08/13/2022   PHOS 2.9 05/22/2022   PROT 5.9 (L) 08/11/2022   ALBUMIN 3.0 (L) 08/11/2022   LABGLOB 2.7 09/19/2018   AGRATIO 1.6 09/19/2018   BILITOT 0.5 08/11/2022   ALKPHOS 64 08/11/2022   AST 13 (L) 08/11/2022   ALT 13 08/11/2022   ANIONGAP 6 08/13/2022   Last lipids Lab Results  Component Value Date   CHOL 130 02/02/2022   HDL 52.10 02/02/2022   LDLCALC 55 02/02/2022   LDLDIRECT 162.0 07/17/2015   TRIG 111.0 02/02/2022   CHOLHDL 2 02/02/2022   Last hemoglobin A1c Lab Results  Component Value Date   HGBA1C 7.3 (A) 08/04/2022   HGBA1C 7.3 08/04/2022   HGBA1C 7.3 (A) 08/04/2022   HGBA1C 7.3 (A) 08/04/2022   IMPRESSION AND PLAN:  #1 diabetes with nephropathy. Control has historically been very good lately but hemoglobin A1c up a bit-->POC Hba1c 7.3% today. However, we will keep current therapy going: Insulin 70/30, 4 units twice a day. Urine microalbumin/creatinine today.  2.  History of hypertension plus history of orthostatic hypotension. Currently stable blood pressure on no antihypertensives.  3.  Chronic renal insufficiency stage III. Electrolytes and creatinine  today.  #4 COPD exacerbation. Decided against a prednisone burst today. Decided to add Symbicort 160-4.5, 2 puffs twice daily.  AeroChamber prescribed.  #5 major depressive disorder, mild, active. Increase Wellbutrin to 300 mg/day.  An After Visit Summary was printed and given to the patient.  FOLLOW UP: Return in about 2 weeks (around 08/18/2022) for f/u copd/mood. Next cpe 04/2023  Signed:  Crissie Sickles, MD           08/15/2022

## 2022-08-10 ENCOUNTER — Emergency Department (HOSPITAL_COMMUNITY): Payer: Medicare Other

## 2022-08-10 ENCOUNTER — Other Ambulatory Visit: Payer: Self-pay

## 2022-08-10 ENCOUNTER — Inpatient Hospital Stay (HOSPITAL_COMMUNITY)
Admission: EM | Admit: 2022-08-10 | Discharge: 2022-08-13 | DRG: 202 | Disposition: A | Discharge status: Home Health | Payer: Medicare Other | Attending: Internal Medicine | Admitting: Internal Medicine

## 2022-08-10 ENCOUNTER — Encounter (HOSPITAL_COMMUNITY): Payer: Self-pay | Admitting: Internal Medicine

## 2022-08-10 DIAGNOSIS — E785 Hyperlipidemia, unspecified: Secondary | ICD-10-CM | POA: Diagnosis not present

## 2022-08-10 DIAGNOSIS — E11319 Type 2 diabetes mellitus with unspecified diabetic retinopathy without macular edema: Secondary | ICD-10-CM | POA: Diagnosis not present

## 2022-08-10 DIAGNOSIS — I130 Hypertensive heart and chronic kidney disease with heart failure and stage 1 through stage 4 chronic kidney disease, or unspecified chronic kidney disease: Secondary | ICD-10-CM | POA: Diagnosis present

## 2022-08-10 DIAGNOSIS — R6510 Systemic inflammatory response syndrome (SIRS) of non-infectious origin without acute organ dysfunction: Secondary | ICD-10-CM | POA: Diagnosis present

## 2022-08-10 DIAGNOSIS — F419 Anxiety disorder, unspecified: Secondary | ICD-10-CM | POA: Diagnosis present

## 2022-08-10 DIAGNOSIS — E739 Lactose intolerance, unspecified: Secondary | ICD-10-CM | POA: Diagnosis not present

## 2022-08-10 DIAGNOSIS — I5032 Chronic diastolic (congestive) heart failure: Secondary | ICD-10-CM | POA: Diagnosis not present

## 2022-08-10 DIAGNOSIS — Z800 Family history of malignant neoplasm of digestive organs: Secondary | ICD-10-CM

## 2022-08-10 DIAGNOSIS — Z79899 Other long term (current) drug therapy: Secondary | ICD-10-CM | POA: Diagnosis not present

## 2022-08-10 DIAGNOSIS — Z841 Family history of disorders of kidney and ureter: Secondary | ICD-10-CM

## 2022-08-10 DIAGNOSIS — Z1152 Encounter for screening for COVID-19: Secondary | ICD-10-CM

## 2022-08-10 DIAGNOSIS — B957 Other staphylococcus as the cause of diseases classified elsewhere: Secondary | ICD-10-CM | POA: Diagnosis not present

## 2022-08-10 DIAGNOSIS — E1122 Type 2 diabetes mellitus with diabetic chronic kidney disease: Secondary | ICD-10-CM | POA: Diagnosis not present

## 2022-08-10 DIAGNOSIS — M19041 Primary osteoarthritis, right hand: Secondary | ICD-10-CM | POA: Diagnosis not present

## 2022-08-10 DIAGNOSIS — R000 Tachycardia, unspecified: Secondary | ICD-10-CM | POA: Diagnosis present

## 2022-08-10 DIAGNOSIS — J440 Chronic obstructive pulmonary disease with acute lower respiratory infection: Secondary | ICD-10-CM | POA: Diagnosis present

## 2022-08-10 DIAGNOSIS — Z794 Long term (current) use of insulin: Secondary | ICD-10-CM

## 2022-08-10 DIAGNOSIS — I2510 Atherosclerotic heart disease of native coronary artery without angina pectoris: Secondary | ICD-10-CM | POA: Diagnosis not present

## 2022-08-10 DIAGNOSIS — Z85118 Personal history of other malignant neoplasm of bronchus and lung: Secondary | ICD-10-CM

## 2022-08-10 DIAGNOSIS — R531 Weakness: Secondary | ICD-10-CM | POA: Diagnosis not present

## 2022-08-10 DIAGNOSIS — Z87891 Personal history of nicotine dependence: Secondary | ICD-10-CM

## 2022-08-10 DIAGNOSIS — F32A Depression, unspecified: Secondary | ICD-10-CM | POA: Diagnosis not present

## 2022-08-10 DIAGNOSIS — R0689 Other abnormalities of breathing: Secondary | ICD-10-CM | POA: Diagnosis not present

## 2022-08-10 DIAGNOSIS — R7881 Bacteremia: Secondary | ICD-10-CM | POA: Diagnosis present

## 2022-08-10 DIAGNOSIS — Z8673 Personal history of transient ischemic attack (TIA), and cerebral infarction without residual deficits: Secondary | ICD-10-CM

## 2022-08-10 DIAGNOSIS — F0390 Unspecified dementia without behavioral disturbance: Secondary | ICD-10-CM | POA: Diagnosis not present

## 2022-08-10 DIAGNOSIS — E1165 Type 2 diabetes mellitus with hyperglycemia: Secondary | ICD-10-CM | POA: Diagnosis not present

## 2022-08-10 DIAGNOSIS — N1831 Chronic kidney disease, stage 3a: Secondary | ICD-10-CM | POA: Diagnosis not present

## 2022-08-10 DIAGNOSIS — Z7951 Long term (current) use of inhaled steroids: Secondary | ICD-10-CM

## 2022-08-10 DIAGNOSIS — R6889 Other general symptoms and signs: Secondary | ICD-10-CM | POA: Diagnosis not present

## 2022-08-10 DIAGNOSIS — J441 Chronic obstructive pulmonary disease with (acute) exacerbation: Secondary | ICD-10-CM | POA: Diagnosis not present

## 2022-08-10 DIAGNOSIS — Z886 Allergy status to analgesic agent status: Secondary | ICD-10-CM

## 2022-08-10 DIAGNOSIS — Z743 Need for continuous supervision: Secondary | ICD-10-CM | POA: Diagnosis not present

## 2022-08-10 DIAGNOSIS — Z833 Family history of diabetes mellitus: Secondary | ICD-10-CM

## 2022-08-10 DIAGNOSIS — Z7982 Long term (current) use of aspirin: Secondary | ICD-10-CM

## 2022-08-10 DIAGNOSIS — E782 Mixed hyperlipidemia: Secondary | ICD-10-CM | POA: Diagnosis not present

## 2022-08-10 DIAGNOSIS — J209 Acute bronchitis, unspecified: Secondary | ICD-10-CM | POA: Diagnosis not present

## 2022-08-10 DIAGNOSIS — K219 Gastro-esophageal reflux disease without esophagitis: Secondary | ICD-10-CM | POA: Diagnosis present

## 2022-08-10 DIAGNOSIS — J40 Bronchitis, not specified as acute or chronic: Secondary | ICD-10-CM

## 2022-08-10 DIAGNOSIS — M810 Age-related osteoporosis without current pathological fracture: Secondary | ICD-10-CM | POA: Diagnosis present

## 2022-08-10 DIAGNOSIS — Z7952 Long term (current) use of systemic steroids: Secondary | ICD-10-CM

## 2022-08-10 DIAGNOSIS — C3490 Malignant neoplasm of unspecified part of unspecified bronchus or lung: Secondary | ICD-10-CM | POA: Diagnosis not present

## 2022-08-10 DIAGNOSIS — I10 Essential (primary) hypertension: Secondary | ICD-10-CM | POA: Diagnosis present

## 2022-08-10 DIAGNOSIS — A419 Sepsis, unspecified organism: Secondary | ICD-10-CM | POA: Diagnosis not present

## 2022-08-10 DIAGNOSIS — M19042 Primary osteoarthritis, left hand: Secondary | ICD-10-CM | POA: Diagnosis present

## 2022-08-10 DIAGNOSIS — Z8701 Personal history of pneumonia (recurrent): Secondary | ICD-10-CM

## 2022-08-10 LAB — GLUCOSE, CAPILLARY
Glucose-Capillary: 243 mg/dL — ABNORMAL HIGH (ref 70–99)
Glucose-Capillary: 356 mg/dL — ABNORMAL HIGH (ref 70–99)

## 2022-08-10 LAB — COMPREHENSIVE METABOLIC PANEL
ALT: 13 U/L (ref 0–44)
AST: 19 U/L (ref 15–41)
Albumin: 3.5 g/dL (ref 3.5–5.0)
Alkaline Phosphatase: 75 U/L (ref 38–126)
Anion gap: 8 (ref 5–15)
BUN: 22 mg/dL (ref 8–23)
CO2: 25 mmol/L (ref 22–32)
Calcium: 8.5 mg/dL — ABNORMAL LOW (ref 8.9–10.3)
Chloride: 108 mmol/L (ref 98–111)
Creatinine, Ser: 1.19 mg/dL — ABNORMAL HIGH (ref 0.44–1.00)
GFR, Estimated: 48 mL/min — ABNORMAL LOW (ref >60)
Glucose, Bld: 147 mg/dL — ABNORMAL HIGH (ref 70–99)
Potassium: 3.9 mmol/L (ref 3.5–5.1)
Sodium: 141 mmol/L (ref 135–145)
Total Bilirubin: 0.7 mg/dL (ref 0.3–1.2)
Total Protein: 6.4 g/dL — ABNORMAL LOW (ref 6.5–8.1)

## 2022-08-10 LAB — CBC WITH DIFFERENTIAL/PLATELET
Abs Immature Granulocytes: 0.09 10*3/uL — ABNORMAL HIGH (ref 0.00–0.07)
Basophils Absolute: 0 10*3/uL (ref 0.0–0.1)
Basophils Relative: 0 %
Eosinophils Absolute: 0.1 10*3/uL (ref 0.0–0.5)
Eosinophils Relative: 0 %
HCT: 38.6 % (ref 36.0–46.0)
Hemoglobin: 12.1 g/dL (ref 12.0–15.0)
Immature Granulocytes: 1 %
Lymphocytes Relative: 8 %
Lymphs Abs: 1.2 10*3/uL (ref 0.7–4.0)
MCH: 28.5 pg (ref 26.0–34.0)
MCHC: 31.3 g/dL (ref 30.0–36.0)
MCV: 90.8 fL (ref 80.0–100.0)
Monocytes Absolute: 1.7 10*3/uL — ABNORMAL HIGH (ref 0.1–1.0)
Monocytes Relative: 11 %
Neutro Abs: 12.3 10*3/uL — ABNORMAL HIGH (ref 1.7–7.7)
Neutrophils Relative %: 80 %
Platelets: 185 10*3/uL (ref 150–400)
RBC: 4.25 MIL/uL (ref 3.87–5.11)
RDW: 15.3 % (ref 11.5–15.5)
WBC: 15.5 10*3/uL — ABNORMAL HIGH (ref 4.0–10.5)
nRBC: 0 % (ref 0.0–0.2)

## 2022-08-10 LAB — URINALYSIS, ROUTINE W REFLEX MICROSCOPIC
Bilirubin Urine: NEGATIVE
Glucose, UA: 50 mg/dL — AB
Hgb urine dipstick: NEGATIVE
Ketones, ur: NEGATIVE mg/dL
Leukocytes,Ua: NEGATIVE
Nitrite: NEGATIVE
Protein, ur: NEGATIVE mg/dL
Specific Gravity, Urine: 1.017 (ref 1.005–1.030)
pH: 7 (ref 5.0–8.0)

## 2022-08-10 LAB — PROCALCITONIN: Procalcitonin: <0.1 ng/mL

## 2022-08-10 LAB — LACTIC ACID, PLASMA
Lactic Acid, Venous: 2.1 mmol/L (ref 0.5–1.9)
Lactic Acid, Venous: 1.5 mmol/L (ref 0.5–1.9)

## 2022-08-10 LAB — CBG MONITORING, ED: Glucose-Capillary: 131 mg/dL — ABNORMAL HIGH (ref 70–99)

## 2022-08-10 LAB — STREP PNEUMONIAE URINARY ANTIGEN: Strep Pneumo Urinary Antigen: NEGATIVE

## 2022-08-10 LAB — TROPONIN I (HIGH SENSITIVITY)
Troponin I (High Sensitivity): 6 ng/L (ref <18)
Troponin I (High Sensitivity): 6 ng/L (ref <18)

## 2022-08-10 LAB — SARS CORONAVIRUS 2 BY RT PCR: SARS Coronavirus 2 by RT PCR: NEGATIVE

## 2022-08-10 MED ORDER — SODIUM CHLORIDE 0.9 % IV SOLN
1.0000 g | INTRAVENOUS | Status: AC
  Administered 2022-08-10: 1 g via INTRAVENOUS
  Filled 2022-08-10: qty 10

## 2022-08-10 MED ORDER — IPRATROPIUM-ALBUTEROL 0.5-2.5 (3) MG/3ML IN SOLN
3.0000 mL | Freq: Once | RESPIRATORY_TRACT | Status: AC
  Administered 2022-08-10: 3 mL via RESPIRATORY_TRACT
  Filled 2022-08-10: qty 3

## 2022-08-10 MED ORDER — INSULIN ASPART 100 UNIT/ML IJ SOLN
5.0000 [IU] | Freq: Once | INTRAMUSCULAR | Status: AC
  Administered 2022-08-10: 5 [IU] via SUBCUTANEOUS

## 2022-08-10 MED ORDER — METHYLPREDNISOLONE SODIUM SUCC 125 MG IJ SOLR
125.0000 mg | Freq: Once | INTRAMUSCULAR | Status: AC
  Administered 2022-08-10: 125 mg via INTRAVENOUS
  Filled 2022-08-10: qty 2

## 2022-08-10 MED ORDER — SODIUM CHLORIDE 0.9 % IV SOLN
500.0000 mg | INTRAVENOUS | Status: DC
  Administered 2022-08-11 – 2022-08-13 (×3): 500 mg via INTRAVENOUS
  Filled 2022-08-10 (×3): qty 5

## 2022-08-10 MED ORDER — ALBUTEROL SULFATE (2.5 MG/3ML) 0.083% IN NEBU: 2.5000 mg | INHALATION_SOLUTION | RESPIRATORY_TRACT | Status: DC | PRN

## 2022-08-10 MED ORDER — ACETAMINOPHEN 650 MG RE SUPP: 650.0000 mg | Freq: Four times a day (QID) | RECTAL | Status: DC | PRN

## 2022-08-10 MED ORDER — ATORVASTATIN CALCIUM 40 MG PO TABS
40.0000 mg | ORAL_TABLET | Freq: Every day | ORAL | Status: DC
  Administered 2022-08-10 – 2022-08-12 (×3): 40 mg via ORAL
  Filled 2022-08-10 (×3): qty 1

## 2022-08-10 MED ORDER — PANTOPRAZOLE SODIUM 40 MG PO TBEC
40.0000 mg | DELAYED_RELEASE_TABLET | Freq: Every day | ORAL | Status: DC
  Administered 2022-08-10 – 2022-08-13 (×4): 40 mg via ORAL
  Filled 2022-08-10 (×3): qty 1

## 2022-08-10 MED ORDER — PREDNISONE 20 MG PO TABS
40.0000 mg | ORAL_TABLET | Freq: Every day | ORAL | Status: DC
  Administered 2022-08-11 – 2022-08-13 (×3): 40 mg via ORAL
  Filled 2022-08-10 (×3): qty 2

## 2022-08-10 MED ORDER — SODIUM CHLORIDE 0.9 % IV SOLN
500.0000 mg | Freq: Once | INTRAVENOUS | Status: AC
  Administered 2022-08-10: 500 mg via INTRAVENOUS
  Filled 2022-08-10: qty 5

## 2022-08-10 MED ORDER — ONDANSETRON HCL 4 MG PO TABS: 4.0000 mg | ORAL_TABLET | Freq: Four times a day (QID) | ORAL | Status: DC | PRN

## 2022-08-10 MED ORDER — ONDANSETRON HCL 4 MG/2ML IJ SOLN: 4.0000 mg | Freq: Four times a day (QID) | INTRAMUSCULAR | Status: DC | PRN

## 2022-08-10 MED ORDER — SODIUM CHLORIDE 0.9 % IV SOLN
1.0000 g | Freq: Once | INTRAVENOUS | Status: AC
  Administered 2022-08-10: 1 g via INTRAVENOUS
  Filled 2022-08-10: qty 10

## 2022-08-10 MED ORDER — GUAIFENESIN-DM 100-10 MG/5ML PO SYRP
5.0000 mL | ORAL_SOLUTION | ORAL | Status: DC | PRN
  Administered 2022-08-10: 5 mL via ORAL
  Filled 2022-08-10: qty 5

## 2022-08-10 MED ORDER — ACETAMINOPHEN 325 MG PO TABS: 650.0000 mg | ORAL_TABLET | Freq: Four times a day (QID) | ORAL | Status: DC | PRN

## 2022-08-10 MED ORDER — IPRATROPIUM-ALBUTEROL 0.5-2.5 (3) MG/3ML IN SOLN
3.0000 mL | Freq: Four times a day (QID) | RESPIRATORY_TRACT | Status: DC
  Administered 2022-08-10 – 2022-08-11 (×3): 3 mL via RESPIRATORY_TRACT
  Filled 2022-08-10 (×3): qty 3

## 2022-08-10 MED ORDER — LACTATED RINGERS IV BOLUS
1000.0000 mL | Freq: Once | INTRAVENOUS | Status: AC
  Administered 2022-08-10: 1000 mL via INTRAVENOUS

## 2022-08-10 MED ORDER — IPRATROPIUM-ALBUTEROL 0.5-2.5 (3) MG/3ML IN SOLN: 3.0000 mL | RESPIRATORY_TRACT | Status: DC | PRN

## 2022-08-10 MED ORDER — SODIUM CHLORIDE 0.9 % IV SOLN
2.0000 g | INTRAVENOUS | Status: DC
  Administered 2022-08-11 – 2022-08-13 (×3): 2 g via INTRAVENOUS
  Filled 2022-08-10 (×3): qty 20

## 2022-08-10 MED ORDER — ENOXAPARIN SODIUM 40 MG/0.4ML IJ SOSY
40.0000 mg | PREFILLED_SYRINGE | Freq: Every day | INTRAMUSCULAR | Status: DC
  Administered 2022-08-10 – 2022-08-12 (×3): 40 mg via SUBCUTANEOUS
  Filled 2022-08-10 (×3): qty 0.4

## 2022-08-10 MED ORDER — MAGNESIUM SULFATE 2 GM/50ML IV SOLN
2.0000 g | Freq: Once | INTRAVENOUS | Status: AC
  Administered 2022-08-10: 2 g via INTRAVENOUS
  Filled 2022-08-10: qty 50

## 2022-08-10 MED ORDER — INSULIN ASPART 100 UNIT/ML IJ SOLN
0.0000 [IU] | Freq: Three times a day (TID) | INTRAMUSCULAR | Status: DC
  Administered 2022-08-10: 5 [IU] via SUBCUTANEOUS
  Administered 2022-08-11: 3 [IU] via SUBCUTANEOUS
  Administered 2022-08-11: 2 [IU] via SUBCUTANEOUS
  Administered 2022-08-11: 5 [IU] via SUBCUTANEOUS
  Administered 2022-08-12: 2 [IU] via SUBCUTANEOUS
  Administered 2022-08-12: 8 [IU] via SUBCUTANEOUS
  Administered 2022-08-12 – 2022-08-13 (×2): 2 [IU] via SUBCUTANEOUS
  Filled 2022-08-10: qty 0.15

## 2022-08-10 MED ORDER — ASPIRIN 81 MG PO TBEC
81.0000 mg | DELAYED_RELEASE_TABLET | Freq: Every day | ORAL | Status: DC
  Administered 2022-08-10 – 2022-08-13 (×4): 81 mg via ORAL
  Filled 2022-08-10 (×5): qty 1

## 2022-08-10 NOTE — Progress Notes (Signed)
Connected patient to bedside continuous pulse ox monitor.

## 2022-08-10 NOTE — ED Notes (Signed)
Pt unable to ambulate.

## 2022-08-10 NOTE — ED Notes (Signed)
Fluids given to patient and fluids tolerated well. JRPRN

## 2022-08-10 NOTE — ED Triage Notes (Signed)
BIB [facility] - patient states she woke up with increased weakness, unable to walk when trying to get off toilet. Member is alert to person, place and what is going on. Confused to day and month. Non productive cough. CBS 131. Patient denies pain.

## 2022-08-10 NOTE — Plan of Care (Signed)

## 2022-08-10 NOTE — H&P (Signed)
History and Physical    Patient: [name] NFA:[id number] DOB: 1946-02-03 DOA: 08/10/2022 DOS: the patient was seen and examined on 08/10/2022 PCP: [name], MD  Patient coming from: Home  Chief Complaint: No chief complaint on file.  HPI: [name] is a 76 y.o. female with medical history significant of stage 3a CKD, history of acute renal failure, bilateral cataracts, bilateral diabetic retinopathy, anxiety and depression, diverticulosis, diverticular abscess, COPD, history of small ischemic nonhemorrhagic CVA, type II DM, GERD, history of concussion without loss of consciousness, iron deficiency anemia, hyperlipidemia hypertension, LBBB, stage III lung cancer, nonobstructive CAD, osteoarthritis, osteoporosis, history of pneumonia, allergic rhinitis, subclinical hypothyroidism, Takotsubo cardiomyopathy, torsades de pointes who was sent from her facility due to generalized weakness and inability to stand up.  The patient stated that she has a chronic cough, however over the last week or her cough has become more productive associated with dyspnea, wheezing and fatigue.  She has had headache as well.  She is not sure that she had a fever, but stated that her grandson told that she felt warm a couple evenings ago.  She denied rhinorrhea, sore throat or hemoptysis.  No chest pain, palpitations, diaphoresis, PND, orthopnea or pitting edema of the lower extremities.  No abdominal pain, nausea, emesis, diarrhea, constipation, melena or hematochezia.  No flank pain, dysuria, frequency or hematuria.  No polyuria, polydipsia, polyphagia or blurred vision.   ED course: Initial vital signs were temperature 97.2 F, pulse 76, respirations 18, BP 114/48 mmHg and O2 sat 99% on room air.  The patient received a DuoNeb, 2000 mL of LR bolus, ceftriaxone and azithromycin.  Lab work: Her urinalysis showed glucosuria 50 mg/deciliter but was otherwise normal.  Troponin x2 was normal.   Coronavirus PCR was negative.  CBC showed a white count of 15.5 with 80% neutrophils, hemoglobin 12.1 g/dL platelets 185.  CMP showed normal electrolytes after calcium correction.  Glucose 147, BUN 22 and creatinine 1.19 mg/dL.  LFTs were normal, except for a total protein level of 6.4 g/dL.  Imaging: Portable 1 view chest radiograph with no acute findings.  There is a chronic right suprahilar opacity and volume loss with pleural thickening, side a remote 3 to lung cancer.  Normal heart size and aortic contours.  Review of Systems: As mentioned in the history of present illness. All other systems reviewed and are negative.  Past Medical History:  Diagnosis Date   Acute renal failure (HCC)    Age-related nuclear cataract of both eyes 2016   +cortical age related cataracts OU    Anxiety and depression    Bilateral diabetic retinopathy ([place]) 2015   Dr. 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   Colonic diverticular abscess    COPD (chronic obstructive pulmonary disease) (HCC)    CVA (cerebral vascular accident) ([place]) 09/2016   MRI did show a left-sided small ischemic stroke which was acute but likely incidental (MRI was done b/c pt had TIA sx's in hospital).  Neuro put her on plavix at that time.  Cardiology d/c'd her plavix 08/2017.   Diabetes mellitus with complication (HCC)    diab retinopathy OU (laser)   Diverticulitis of large intestine with perforation and abscess 09/11/2016   Finger fracture, right 04/2020   [place] 5th [place]ac[place]Dr. Mardelle Matte)   GERD (gastroesophageal reflux disease)    protonix   History of concussion 08/25/2018   w/out loss of consciousness--mild increase in baseline memory impairment after.  CT head neg acute.   History of diverticulitis of colon    with abscess;  required IR percutaneous drain placed 09/2016.   History of iron deficiency anemia    started iron 07/02/19.  EGD/colonoscopy 08/27/19 unrevealing. Capsule endoscopy considered but never done.   Hyperlipidemia    Hypertension    Left bundle branch block (LBBB) 12/16/2016   Lung cancer ([place])    non-small cell lung ca, stage III in 05/2011; systemic chemotherapy concurrent with radiation followed by prophylactic cranial irradiation and has been observation since July of 2010 with no evidence for disease recurrence-released from onc f/u 12/2014 (needs annual cxr by PCP).  CXR 08/2015 stable.  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 Earlie Server did bx and this was NEG for malignancy.   Mild cognitive impairment with memory loss    Likely from brain radiation therapy   Non-obstructive CAD (coronary artery disease)    a. Cath 2006 preserved LV fxn, scattered irregularities without critical stenosis; b. 2008 stress echo negative for ischemia, but with hypertensive response; c. 08/2016 Cath: D1 25%, otw nl.   Normocytic anemia 2018   Iron, vit B12, folate all normal 12/2016.   Open-angle glaucoma 2016   Dr. Shirley Muscat, (bilateral)---responding to topical therapy   Osteoarthritis of both hands    Osteoporosis 03/2016; 03/2018   03/2016 DEXA T-score -2.2.  03/2018 T-score L femoral neck  -2.7--boniva started. DEXA 04/2021 T scor -2.6->boniva continued. Plan rpt DEXA 2 yrs.   Pneumonia    hospitalization 11/2016; hypoxemic resp failure--d/c'd home with home oxygen therapy   Pulmonary nodule, left 08/2021   LUL 11 mm ground glass, needs f/u CT 08/2022.   Seasonal allergic rhinitis    Subclinical hyperthyroidism 2018   Takotsubo cardiomyopathy    a. 08/2016 Echo: EF 30-35%, gr1DD, PASP 39mHg;  b. 08/2016 Cath: nonobs Dzs;  c. 11/2016 Echo: EF 40-45%, no rwma, nl RV fxn, mild TR, PASP 358mg.  ECHO 02/2017 EF 50-55%, grd II DD---essentially resolved Takotsubo 02/2017.   Torsades de pointes ([place]   a. 08/2016 in  setting of diverticulitis & pneumoperitoneum and Takotsubo CM -->prolonged QT, seen by EP-->avoid meds with potential for QT prolongation.   Past Surgical History:  Procedure Laterality Date   ABDOMINAL HYSTERECTOMY  1997   APPENDECTOMY     CARDIAC CATHETERIZATION N/A 09/14/2016   Procedure: Left Heart Cath and Coronary Angiography;  Surgeon: ThTroy SineMD;  [facility];  Service: Cardiovascular; Mild nonobstructive CAD with 85% smooth narrowing in the first diagonal branch of the LAD; 10% smooth narrowing of the ostial proximal left circumflex coronary artery; and a normal dominant RCA.   CARPAL TUNNEL RELEASE     CATARACT EXTRACTION Bilateral    CHOLECYSTECTOMY  2002   COLONOSCOPY  10/24/00; 08/27/19   2002 normal.  [employer] hemoccult testing via [facility] 06/18/15 was NEG.  2020->diverticulosis o/w nl.   dexa  03/2016; 03/2018   03/2018 T-score L femoral neck  -2.7 (worsened compared to osteopenic range 2017.) DEXA 05/03/21 stable T score -2.6 on boniva x 2 yrs->plan rpt DEXA 2 yrs.   ESOPHAGOGASTRODUODENOSCOPY  08/27/2019   Normal.   IR GENERIC HISTORICAL  09/19/2016   IR SINUS/FIST TUBE CHK-NON GI 09/19/2016 JaCorrie MckusickDO [facility]   IR GENERIC HISTORICAL  10/06/2016   IR RADIOLOGIST EVAL & MGMT 10/06/2016 [facility]   IR GENERIC HISTORICAL  10/26/2016   IR RADIOLOGIST EVAL & MGMT 10/26/2016 [facility]   IR GENERIC HISTORICAL  11/03/2016   IR RADIOLOGIST EVAL & MGMT 11/03/2016 Ardis Rowan, PA-C [facility]   IR GENERIC HISTORICAL  11/24/2016   IR RADIOLOGIST EVAL & MGMT 11/24/2016 Ardis Rowan, PA-C [facility]   IR GENERIC HISTORICAL  12/05/2016   Fistula smaller/improving.  IR SINUS/FIST TUBE CHK-NON GI 12/05/2016 Sandi Mariscal, MD [facility]   IR GENERIC HISTORICAL  12/22/2016   IR RADIOLOGIST EVAL & MGMT 12/22/2016 Greggory Keen, MD [facility]   IR RADIOLOGIST EVAL & MGMT  01/10/2017   IR RADIOLOGIST EVAL & MGMT   02/09/2017   IR SINUS/FIST TUBE CHK-NON GI  03/28/2017   LEFT HEART CATHETERIZATION WITH CORONARY ANGIOGRAM N/A 05/30/2012   Procedure: LEFT HEART CATHETERIZATION WITH CORONARY ANGIOGRAM;  Surgeon: Burnell Blanks, MD;  Location: [facility] LAB;  Service: Cardiovascular: Mild, non-obstructive CAD   TRANSTHORACIC ECHOCARDIOGRAM  09/14/2016; 11/2016   08/2016: EF 30-35 %, Akinesis of the mid-apicalanteroseptal myocardium.  Grade I DD.  Mild pulm HTN.  Repeat echo 11/2016, EF 40-45%, no rwma, nl RV fxn, mild TR, PASP 57mHg.     TRANSTHORACIC ECHOCARDIOGRAM  03/07/2017   EF 50-55%. Hypokinesis of the distal septum with overall low normal LV,  systolic function; mild diastolic dysfunction with elevated LV  filling pressure; mildly calcified aortic valve with mild AI; small pericardial effusion   Social History:  reports that she quit smoking about 13 years ago. Her smoking use included cigarettes. She has a 45.00 pack-year smoking history. She has never used smokeless tobacco. She reports that she does not drink alcohol and does not use drugs.  Allergies  Allergen Reactions   Lactose Intolerance (Gi) Diarrhea   Motrin [Ibuprofen] Other (See Comments)    Anxiousness Hyperventilates     Family History  Problem Relation Age of Onset   Heart failure Mother    Kidney disease Mother        renal failure   Diabetes Mother    Diabetes Father    Diabetes Brother    Heart disease Brother    Heart attack Brother    Heart attack Brother    Heart attack Brother    Pancreatic cancer Brother    Colon cancer Neg Hx    Esophageal cancer Neg Hx    Stomach cancer Neg Hx    Rectal cancer Neg Hx     Prior to Admission medications   Medication Sig Start Date End Date Taking? Authorizing Provider  albuterol (PROVENTIL) (2.5 MG/3ML) 0.083% nebulizer solution USE 1 VIAL IN NEBULIZER EVERY 4 HOURS AS NEEDED FOR WHEEZING AND FOR SHORTNESS OF BREATH Patient taking differently: Take 2.5 mg by nebulization  every 4 (four) hours as needed for wheezing or shortness of breath. 02/12/21   McGowen, PAdrian Blackwater MD  albuterol (VENTOLIN HFA) 108 (90 Base) MCG/ACT inhaler INHALE 2 PUFFS BY MOUTH EVERY 6 HOURS AS NEEDED FOR WHEEZING OR SHORTNESS OF BREATH Patient taking differently: 2 puffs every 6 (six) hours as needed for wheezing. 06/05/20   McGowen, PAdrian Blackwater MD  aspirin 81 MG EC tablet Take 81 mg by mouth daily.     [provider]  atorvastatin (LIPITOR) 40 MG tablet Take 1 tablet (40 mg total) by mouth daily. 08/04/22   McGowen, PAdrian Blackwater MD  blood glucose  meter kit and supplies Use to check blood sugars 1-2 times daily. 06/28/22   McGowen, [name], MD  budesonide-formoterol (SYMBICORT) 160-4.5 MCG/ACT inhaler Inhale 2 puffs into the lungs 2 (two) times daily. 08/04/22   McGowen, [name], MD  buPROPion (WELLBUTRIN XL) 300 MG 24 hr tablet Take 1 tablet (300 mg total) by mouth daily. 08/04/22   McGowen, [name], MD  Calcium Carb-Cholecalciferol (CALCIUM + VITAMIN D3 PO) Take 1-2 tablets by mouth See admin instructions. 1 tablet in the morning, 2 tablets in the evening    [provider]  cetirizine (ZYRTEC) 10 MG tablet Take 10 mg by mouth every evening.    [provider]  ibandronate (BONIVA) 150 MG tablet Take 1 tablet (150 mg total) by mouth every 30 (thirty) days. Take in the morning with a full glass of water, on an empty stomach, and do not take anything else by mouth or lie down for the next 30 min. 08/04/22   McGowen, [name], MD  insulin isophane & regular human KwikPen (HUMULIN 70/30 KWIKPEN) (70-30) 100 UNIT/ML KwikPen INJECT 4 UNITS SUBCUTANEOUSLY WITH BREAKFAST AND 2 UNITS WITH SUPPER Patient taking differently: Inject 4 Units into the skin 2 (two) times daily. 02/02/22   McGowen, [name], MD  Lancets [facility] DELICA PLUS [id number]) MISC Apply topically 3 (three) times daily. 06/17/22   [provider]  Multiple Vitamins-Minerals (MULTIVITAMIN WOMEN PO) Take 1  tablet by mouth daily.    [provider]  pantoprazole (PROTONIX) 40 MG tablet Take 1 tablet (40 mg total) by mouth daily. 08/04/22   McGowen, [name], MD  predniSONE (DELTASONE) 20 MG tablet Take 1 tablet (20 mg total) by mouth daily with breakfast. 08/04/22   McGowen, [name], MD  Spacer/Aero-Holding Chambers (AEROCHAMBER MV) inhaler Use as instructed 08/04/22   [name], MD    Physical Exam: Vitals:   08/10/22 1045 08/10/22 1100 08/10/22 1115 08/10/22 1130  BP: (!) 122/50 (!) 120/55 (!) 110/43 103/71  Pulse: 99 100 99 99  Resp: (!) 25 (!) 25 (!) 25 (!) 22  Temp:      TempSrc:      SpO2: 100% 97% 99% 98%  Weight:      Height:       Physical Exam Vitals and nursing note reviewed.  Constitutional:      General: She is awake. She is not in acute distress.    Appearance: Normal appearance.     Comments: Chronically ill-appearing.  HENT:     Head: Normocephalic.     Nose: No rhinorrhea.     Mouth/Throat:     Mouth: Mucous membranes are moist.  Eyes:     General: No scleral icterus.    Pupils: Pupils are equal, round, and reactive to light.  Neck:     Vascular: No JVD.  Cardiovascular:     Rate and Rhythm: Normal rate and regular rhythm.     Heart sounds: S1 normal and S2 normal.  Pulmonary:     Effort: Tachypnea present.     Breath sounds: Decreased air movement present. Examination of the right-upper field reveals wheezing. Examination of the left-upper field reveals wheezing. Examination of the right-middle field reveals wheezing. Examination of the left-middle field reveals wheezing. Wheezing and rhonchi present. No rales.  Chest:     Chest wall: No tenderness.  Abdominal:     General: Bowel sounds are normal. There is no distension.     Palpations: Abdomen is soft.  Tenderness: There is no abdominal tenderness. There is no right CVA tenderness, left CVA tenderness or guarding.  Musculoskeletal:     Cervical back: Neck supple.     Right lower  leg: No edema.     Left lower leg: No edema.  Skin:    General: Skin is warm and dry.  Neurological:     General: No focal deficit present.     Mental Status: She is alert. Mental status is at baseline.  Psychiatric:        Mood and Affect: Mood normal.        Behavior: Behavior normal. Behavior is cooperative.     Data Reviewed:  Results are pending, will review when available.  05/22/2022 echocardiogram: IMPRESSIONS:   1. Windows are challenging. Left ventricular ejection fraction, by  estimation, is 60 to 65%. The left ventricle has normal function. The left  ventricle has no regional wall motion abnormalities. Indeterminate  diastolic filling due to E-A fusion.   2. Right ventricular systolic function is normal. The right ventricular  size is normal.   3. The mitral valve was not well visualized. No evidence of mitral valve  regurgitation.   4. The aortic valve was not well visualized. Aortic valve regurgitation  is not visualized.   5. The inferior vena cava is normal in size with greater than 50%  respiratory variability, suggesting right atrial pressure of 3 mmHg.    Assessment and Plan: Principal Problem:   SIRS (systemic inflammatory response syndrome) (HCC) Technically met sepsis criteria, but may have other reasons for mildly elevated lactic (bronchodilators/hypoxia). In the setting of:   Acute bronchitis Precipitating:   COPD exacerbation ([place]) Admit to telemetry as/inpatient Continue supplemental oxygen. Scheduled and as needed bronchodilators. Continue ceftriaxone 1 g IVPB daily. Continue azithromycin 500 mg IVPB daily. Check strep pneumoniae urinary antigen. Check sputum Gram stain, culture and sensitivity. Follow-up blood culture and sensitivity. Follow-up CBC and chemistry in the morning.  Active Problems:   Dementia without behavioral disturbance ([place]) Continue or defer Wellbutrin pending med rec. Reorientation and supportive care as  needed    GERD (gastroesophageal reflux disease) Continue pantoprazole 40 mg p.o. daily.    Controlled type 2 diabetes mellitus with hyperglycemia,  with long-term current use of insulin (HCC) Last hemoglobin A1c 7.2% last week. Carb modified diet. CBG monitoring before meals and bedtime. RI SS per CBG monitoring.    Hyperlipidemia Atorvastatin 40 mg p.o. daily.    Essential hypertension Currently not on therapy. Follow-up updated med rec. Monitor blood pressure. Antihypertensives as needed.    Chronic diastolic (congestive) heart failure (HCC) No signs of decompensation.     Advance Care Planning:   Code Status: Full Code   Consults:   Family Communication:   Severity of Illness: The appropriate patient status for this patient is OBSERVATION. Observation status is judged to be reasonable and necessary in order to provide the required intensity of service to ensure the patient's safety. The patient's presenting symptoms, physical exam findings, and initial radiographic and laboratory data in the context of their medical condition is felt to place them at decreased risk for further clinical deterioration. Furthermore, it is anticipated that the patient will be medically stable for discharge from the hospital within 2 midnights of admission.   Author: Reubin Milan, MD 08/10/2022 12:19 PM  For on call review [email or web address].   This document was prepared using Dragon voice recognition software and may contain some unintended transcription errors.

## 2022-08-10 NOTE — ED Provider Notes (Signed)
[facility] DEPT Provider Note   CSN: [id number] Arrival date & time: 08/10/22  0909     History  No chief complaint on file.   [name] is a 76 y.o. female.  Patient with a history of COPD, diabetes, nonobstructive CAD, CKD, lung cancer presenting by EMS with concern for sepsis.  Patient states she woke up feeling well this morning but all of a sudden became weak all over and was not able to stand or walk.  Denies falling but did slip off the toilet without any injury.  Denies hitting her head or losing consciousness.  Denies feeling dizzy or lightheaded.  States she feels generally weak and unable to stand or walk.  Denies any chest pain or shortness of breath.  Denies any abdominal pain, nausea or vomiting.  Denies any fever.  She has a chronic cough which is unchanged. Family was concerned for possible UTI which she has a history of.  Patient states she been eating and drinking well without vomiting or diarrhea.  She does not feel ill.  EMS reports she was tachycardic and tachypneic and they were concerned for sepsis of the initiated IV fluids.  The history is provided by the patient and the EMS personnel.       Home Medications Prior to Admission medications   Medication Sig Start Date End Date Taking? Authorizing Provider  albuterol (PROVENTIL) (2.5 MG/3ML) 0.083% nebulizer solution USE 1 VIAL IN NEBULIZER EVERY 4 HOURS AS NEEDED FOR WHEEZING AND FOR SHORTNESS OF BREATH Patient taking differently: Take 2.5 mg by nebulization every 4 (four) hours as needed for wheezing or shortness of breath. 02/12/21   McGowen, [name], MD  albuterol (VENTOLIN HFA) 108 (90 Base) MCG/ACT inhaler INHALE 2 PUFFS BY MOUTH EVERY 6 HOURS AS NEEDED FOR WHEEZING OR SHORTNESS OF BREATH Patient taking differently: 2 puffs every 6 (six) hours as needed for wheezing. 06/05/20   McGowen, [name], MD  aspirin 81 MG EC tablet Take 81 mg by mouth daily.     [provider]  atorvastatin (LIPITOR) 40 MG tablet Take 1 tablet (40 mg total) by mouth daily. 08/04/22   McGowen, [name], MD  blood glucose meter kit and supplies Use to check blood sugars 1-2 times daily. 06/28/22   McGowen, [name], MD  budesonide-formoterol (SYMBICORT) 160-4.5 MCG/ACT inhaler Inhale 2 puffs into the lungs 2 (two) times daily. 08/04/22   McGowen, [name], MD  buPROPion (WELLBUTRIN XL) 300 MG 24 hr tablet Take 1 tablet (300 mg total) by mouth daily. 08/04/22   McGowen, [name], MD  Calcium Carb-Cholecalciferol (CALCIUM + VITAMIN D3 PO) Take 1-2 tablets by mouth See admin instructions. 1 tablet in the morning, 2 tablets in the evening    [provider]  cetirizine (ZYRTEC) 10 MG tablet Take 10 mg by mouth every evening.    [provider]  ibandronate (BONIVA) 150 MG tablet Take 1 tablet (150 mg total) by mouth every 30 (thirty) days. Take in the morning with a full glass of water, on an empty stomach, and do not take anything else by mouth or lie down for the next 30 min. 08/04/22   McGowen, [name], MD  insulin isophane & regular human KwikPen (HUMULIN 70/30 KWIKPEN) (70-30) 100 UNIT/ML KwikPen INJECT 4 UNITS SUBCUTANEOUSLY WITH BREAKFAST AND 2 UNITS WITH SUPPER Patient taking differently: Inject 4 Units into the skin 2 (two) times daily. 02/02/22   McGowen, [name], MD  Lancets (ONETOUCH DELICA PLUS [id number]) MISC Apply topically 3 (three) times daily. 06/17/22   [provider]  Multiple Vitamins-Minerals (MULTIVITAMIN WOMEN PO) Take 1 tablet by mouth daily.    [provider]  pantoprazole (PROTONIX) 40 MG tablet Take 1 tablet (40 mg total) by mouth daily. 08/04/22   McGowen, [name], MD  predniSONE (DELTASONE) 20 MG tablet Take 1 tablet (20 mg total) by mouth daily with breakfast. 08/04/22   McGowen, [name], MD  Spacer/Aero-Holding Chambers (AEROCHAMBER MV) inhaler Use as instructed 08/04/22   McGowen, [name], MD      Allergies     Lactose intolerance (gi) and Motrin [ibuprofen]    Review of Systems   Review of Systems  Constitutional:  Positive for fatigue. Negative for activity change, appetite change and fever.  Respiratory:  Positive for cough. Negative for chest tightness and shortness of breath.   Cardiovascular:  Negative for chest pain.  Gastrointestinal:  Negative for abdominal pain, nausea and vomiting.  Genitourinary:  Negative for dysuria and hematuria.  Musculoskeletal:  Negative for arthralgias and myalgias.  Neurological:  Positive for weakness. Negative for dizziness and headaches.   all other systems are negative except as noted in the HPI and PMH.    Physical Exam Updated Vital Signs BP (!) 105/43 (BP Location: Right Arm)   Pulse 97   Temp 97.9 F (36.6 C) (Oral)   Resp 17   Ht 5' (1.524 m)   Wt 67.1 kg   SpO2 98%   BMI 28.89 kg/m  Physical Exam Vitals and nursing note reviewed.  Constitutional:      General: She is not in acute distress.    Appearance: She is well-developed.     Comments: Moist cough  HENT:     Head: Normocephalic and atraumatic.     Mouth/Throat:     Mouth: Mucous membranes are dry.     Pharynx: No oropharyngeal exudate.  Eyes:     Conjunctiva/sclera: Conjunctivae normal.     Pupils: Pupils are equal, round, and reactive to light.  Neck:     Comments: No meningismus. Cardiovascular:     Rate and Rhythm: Normal rate and regular rhythm.     Heart sounds: Normal heart sounds. No murmur heard. Pulmonary:     Effort: Pulmonary effort is normal. No respiratory distress.     Breath sounds: Normal breath sounds.  Abdominal:     Palpations: Abdomen is soft.     Tenderness: There is no abdominal tenderness. There is no guarding or rebound.  Musculoskeletal:        General: No tenderness. Normal range of motion.     Cervical back: Normal range of motion and neck supple.  Skin:    General: Skin is warm.  Neurological:     Mental Status: She is alert and  oriented to person, place, and time.     Cranial Nerves: No cranial nerve deficit.     Motor: No abnormal muscle tone.     Coordination: Coordination normal.     Comments:  5/5 strength throughout. CN 2-12 intact.Equal grip strength.   Psychiatric:        Behavior: Behavior normal.     ED Results / Procedures / Treatments   Labs (all labs ordered are listed, but only abnormal results are displayed) Labs Reviewed  URINALYSIS, ROUTINE W REFLEX MICROSCOPIC - Abnormal; Notable for the following components:      Result Value   Glucose, UA 50 (*)  All other components within normal limits  CBC WITH DIFFERENTIAL/PLATELET - Abnormal; Notable for the following components:   WBC 15.5 (*)    Neutro Abs 12.3 (*)    Monocytes Absolute 1.7 (*)    Abs Immature Granulocytes 0.09 (*)    All other components within normal limits  COMPREHENSIVE METABOLIC PANEL - Abnormal; Notable for the following components:   Glucose, Bld 147 (*)    Creatinine, Ser 1.19 (*)    Calcium 8.5 (*)    Total Protein 6.4 (*)    GFR, Estimated 48 (*)    All other components within normal limits  LACTIC ACID, PLASMA - Abnormal; Notable for the following components:   Lactic Acid, Venous 2.1 (*)    All other components within normal limits  GLUCOSE, CAPILLARY - Abnormal; Notable for the following components:   Glucose-Capillary 243 (*)    All other components within normal limits  CBG MONITORING, ED - Abnormal; Notable for the following components:   Glucose-Capillary 131 (*)    All other components within normal limits  SARS CORONAVIRUS 2 BY RT PCR  URINE CULTURE  CULTURE, BLOOD (ROUTINE X 2)  CULTURE, BLOOD (ROUTINE X 2)  EXPECTORATED SPUTUM ASSESSMENT W GRAM STAIN, RFLX TO RESP C  LACTIC ACID, PLASMA  PROCALCITONIN  STREP PNEUMONIAE URINARY ANTIGEN  CBC WITH DIFFERENTIAL/PLATELET  COMPREHENSIVE METABOLIC PANEL  PROCALCITONIN  TROPONIN I (HIGH SENSITIVITY)  TROPONIN I (HIGH SENSITIVITY)    EKG EKG  Interpretation  Date/Time:  Wednesday August 10 2022 09:44:02 EDT Ventricular Rate:  106 PR Interval:  102 QRS Duration: 134 QT Interval:  464 QTC Calculation: 617 R Axis:   17 Text Interpretation: Sinus tachycardia Left bundle branch block Rate faster Confirmed by Ezequiel Essex [phone]) on 08/10/2022 9:48:34 AM  Radiology DG Chest Portable 1 View  Result Date: 08/10/2022 CLINICAL DATA:  Sepsis EXAM: PORTABLE CHEST 1 VIEW COMPARISON:  07/14/2022 FINDINGS: Chronic right suprahilar opacity and volume loss with pleural thickening, site of remote treated lung cancer. Normal heart size and aortic contours. No acute pulmonary opacity. Remote right proximal humerus fracture. No acute osseous finding. IMPRESSION: No acute finding when compared to priors. Electronically Signed   By: Jorje Guild M.D.   On: 08/10/2022 09:45    Procedures Procedures    Medications Ordered in ED Medications  lactated ringers bolus 1,000 mL (has no administration in time range)    ED Course/ Medical Decision Making/ A&P                           Medical Decision Making Amount and/or Complexity of Data Reviewed Independent Historian: EMS Labs: ordered. Decision-making details documented in ED Course. Radiology: ordered and independent interpretation performed. Decision-making details documented in ED Course. ECG/medicine tests: ordered and independent interpretation performed. Decision-making details documented in ED Course.  Risk Prescription drug management. Decision regarding hospitalization.  Generalized weakness acute onset this morning.  No focal deficits.  She was tachycardic and tachypneic with some concern for sepsis.  She has dry mucous membranes.  We will initiate infectious work-up with blood work, urinalysis, chest x-ray, IV hydration.  Patient has SIRS without clear source.  Urinalysis is negative.  Chest x-ray is negative for infiltrate.  COVID testing is pending.  She does have  tachypnea, tachycardia with leukocytosis and elevated lactate.  No clear source  Empirically for pneumonia given her cough, tachypnea, tachycardia with elevated leukocytosis and elevated lactate despite negative chest x-ray.  COVID  testing is negative.  Patient is still generally weak and not able to walk with assistance.  She normally gets around with a walker at home.  Given her generalized weakness, SIRS of unclear etiology we will plan overnight observation admission blood cultures are pending. Family in agreement,  D/w Dr. Olevia Bowens.        Final Clinical Impression(s) / ED Diagnoses Final diagnoses:  SIRS (systemic inflammatory response syndrome) ([facility])  Bronchitis    Rx / DC Orders ED Discharge Orders     None         Analis Distler, Annie Main, MD 08/10/22 1727

## 2022-08-11 ENCOUNTER — Encounter: Payer: Self-pay | Admitting: Family Medicine

## 2022-08-11 DIAGNOSIS — E11319 Type 2 diabetes mellitus with unspecified diabetic retinopathy without macular edema: Secondary | ICD-10-CM | POA: Diagnosis present

## 2022-08-11 DIAGNOSIS — I2510 Atherosclerotic heart disease of native coronary artery without angina pectoris: Secondary | ICD-10-CM | POA: Diagnosis present

## 2022-08-11 DIAGNOSIS — F0390 Unspecified dementia without behavioral disturbance: Secondary | ICD-10-CM

## 2022-08-11 DIAGNOSIS — M19042 Primary osteoarthritis, left hand: Secondary | ICD-10-CM | POA: Diagnosis present

## 2022-08-11 DIAGNOSIS — R7881 Bacteremia: Secondary | ICD-10-CM | POA: Diagnosis present

## 2022-08-11 DIAGNOSIS — N1831 Chronic kidney disease, stage 3a: Secondary | ICD-10-CM | POA: Diagnosis present

## 2022-08-11 DIAGNOSIS — K219 Gastro-esophageal reflux disease without esophagitis: Secondary | ICD-10-CM | POA: Diagnosis present

## 2022-08-11 DIAGNOSIS — B957 Other staphylococcus as the cause of diseases classified elsewhere: Secondary | ICD-10-CM | POA: Diagnosis present

## 2022-08-11 DIAGNOSIS — J209 Acute bronchitis, unspecified: Secondary | ICD-10-CM

## 2022-08-11 DIAGNOSIS — J441 Chronic obstructive pulmonary disease with (acute) exacerbation: Secondary | ICD-10-CM | POA: Diagnosis present

## 2022-08-11 DIAGNOSIS — I130 Hypertensive heart and chronic kidney disease with heart failure and stage 1 through stage 4 chronic kidney disease, or unspecified chronic kidney disease: Secondary | ICD-10-CM | POA: Diagnosis present

## 2022-08-11 DIAGNOSIS — Z87891 Personal history of nicotine dependence: Secondary | ICD-10-CM | POA: Diagnosis not present

## 2022-08-11 DIAGNOSIS — I5032 Chronic diastolic (congestive) heart failure: Secondary | ICD-10-CM | POA: Diagnosis present

## 2022-08-11 DIAGNOSIS — F32A Depression, unspecified: Secondary | ICD-10-CM | POA: Diagnosis present

## 2022-08-11 DIAGNOSIS — E739 Lactose intolerance, unspecified: Secondary | ICD-10-CM | POA: Diagnosis present

## 2022-08-11 DIAGNOSIS — E1165 Type 2 diabetes mellitus with hyperglycemia: Secondary | ICD-10-CM

## 2022-08-11 DIAGNOSIS — Z794 Long term (current) use of insulin: Secondary | ICD-10-CM | POA: Diagnosis not present

## 2022-08-11 DIAGNOSIS — Z1152 Encounter for screening for COVID-19: Secondary | ICD-10-CM | POA: Diagnosis not present

## 2022-08-11 DIAGNOSIS — E782 Mixed hyperlipidemia: Secondary | ICD-10-CM

## 2022-08-11 DIAGNOSIS — R6510 Systemic inflammatory response syndrome (SIRS) of non-infectious origin without acute organ dysfunction: Secondary | ICD-10-CM | POA: Diagnosis present

## 2022-08-11 DIAGNOSIS — E1122 Type 2 diabetes mellitus with diabetic chronic kidney disease: Secondary | ICD-10-CM | POA: Diagnosis present

## 2022-08-11 DIAGNOSIS — J440 Chronic obstructive pulmonary disease with acute lower respiratory infection: Secondary | ICD-10-CM | POA: Diagnosis present

## 2022-08-11 DIAGNOSIS — M19041 Primary osteoarthritis, right hand: Secondary | ICD-10-CM | POA: Diagnosis present

## 2022-08-11 DIAGNOSIS — E785 Hyperlipidemia, unspecified: Secondary | ICD-10-CM | POA: Diagnosis present

## 2022-08-11 DIAGNOSIS — I10 Essential (primary) hypertension: Secondary | ICD-10-CM

## 2022-08-11 DIAGNOSIS — Z79899 Other long term (current) drug therapy: Secondary | ICD-10-CM | POA: Diagnosis not present

## 2022-08-11 LAB — BLOOD CULTURE ID PANEL (REFLEXED) - BCID2

## 2022-08-11 LAB — CBC WITH DIFFERENTIAL/PLATELET
Abs Immature Granulocytes: 0.08 10*3/uL — ABNORMAL HIGH (ref 0.00–0.07)
Basophils Absolute: 0 10*3/uL (ref 0.0–0.1)
Basophils Relative: 0 %
Eosinophils Absolute: 0 10*3/uL (ref 0.0–0.5)
Eosinophils Relative: 0 %
HCT: 35.3 % — ABNORMAL LOW (ref 36.0–46.0)
Hemoglobin: 11.2 g/dL — ABNORMAL LOW (ref 12.0–15.0)
Immature Granulocytes: 1 %
Lymphocytes Relative: 7 %
Lymphs Abs: 0.8 10*3/uL (ref 0.7–4.0)
MCH: 28.6 pg (ref 26.0–34.0)
MCHC: 31.7 g/dL (ref 30.0–36.0)
MCV: 90.1 fL (ref 80.0–100.0)
Monocytes Absolute: 0.6 10*3/uL (ref 0.1–1.0)
Monocytes Relative: 5 %
Neutro Abs: 10.2 10*3/uL — ABNORMAL HIGH (ref 1.7–7.7)
Neutrophils Relative %: 87 %
Platelets: 169 10*3/uL (ref 150–400)
RBC: 3.92 MIL/uL (ref 3.87–5.11)
RDW: 15.2 % (ref 11.5–15.5)
WBC: 11.6 10*3/uL — ABNORMAL HIGH (ref 4.0–10.5)
nRBC: 0 % (ref 0.0–0.2)

## 2022-08-11 LAB — COMPREHENSIVE METABOLIC PANEL
ALT: 13 U/L (ref 0–44)
AST: 13 U/L — ABNORMAL LOW (ref 15–41)
Albumin: 3 g/dL — ABNORMAL LOW (ref 3.5–5.0)
Alkaline Phosphatase: 64 U/L (ref 38–126)
Anion gap: 7 (ref 5–15)
BUN: 23 mg/dL (ref 8–23)
CO2: 21 mmol/L — ABNORMAL LOW (ref 22–32)
Calcium: 8.3 mg/dL — ABNORMAL LOW (ref 8.9–10.3)
Chloride: 109 mmol/L (ref 98–111)
Creatinine, Ser: 0.9 mg/dL (ref 0.44–1.00)
GFR, Estimated: >60 mL/min (ref >60)
Glucose, Bld: 233 mg/dL — ABNORMAL HIGH (ref 70–99)
Potassium: 4 mmol/L (ref 3.5–5.1)
Sodium: 137 mmol/L (ref 135–145)
Total Bilirubin: 0.5 mg/dL (ref 0.3–1.2)
Total Protein: 5.9 g/dL — ABNORMAL LOW (ref 6.5–8.1)

## 2022-08-11 LAB — PROCALCITONIN: Procalcitonin: <0.1 ng/mL

## 2022-08-11 LAB — URINE CULTURE: Culture: NO GROWTH

## 2022-08-11 LAB — GLUCOSE, CAPILLARY
Glucose-Capillary: 172 mg/dL — ABNORMAL HIGH (ref 70–99)
Glucose-Capillary: 128 mg/dL — ABNORMAL HIGH (ref 70–99)
Glucose-Capillary: 228 mg/dL — ABNORMAL HIGH (ref 70–99)
Glucose-Capillary: 247 mg/dL — ABNORMAL HIGH (ref 70–99)

## 2022-08-11 LAB — MAGNESIUM: Magnesium: 2.4 mg/dL (ref 1.7–2.4)

## 2022-08-11 MED ORDER — GLUCERNA SHAKE PO LIQD
237.0000 mL | Freq: Three times a day (TID) | ORAL | Status: DC
  Administered 2022-08-11 – 2022-08-13 (×6): 237 mL via ORAL
  Filled 2022-08-11 (×8): qty 237

## 2022-08-11 MED ORDER — MOMETASONE FURO-FORMOTEROL FUM 200-5 MCG/ACT IN AERO
2.0000 | INHALATION_SPRAY | Freq: Two times a day (BID) | RESPIRATORY_TRACT | Status: DC
  Administered 2022-08-11 – 2022-08-13 (×4): 2 via RESPIRATORY_TRACT
  Filled 2022-08-11: qty 8.8

## 2022-08-11 MED ORDER — IPRATROPIUM-ALBUTEROL 0.5-2.5 (3) MG/3ML IN SOLN
3.0000 mL | Freq: Two times a day (BID) | RESPIRATORY_TRACT | Status: DC
  Administered 2022-08-11 – 2022-08-13 (×4): 3 mL via RESPIRATORY_TRACT
  Filled 2022-08-11 (×4): qty 3

## 2022-08-11 MED ORDER — INSULIN ASPART 100 UNIT/ML IJ SOLN
2.0000 [IU] | Freq: Once | INTRAMUSCULAR | Status: AC
  Administered 2022-08-11: 2 [IU] via SUBCUTANEOUS

## 2022-08-11 MED ORDER — ADULT MULTIVITAMIN W/MINERALS CH
1.0000 | ORAL_TABLET | Freq: Every day | ORAL | Status: DC
  Administered 2022-08-11 – 2022-08-13 (×3): 1 via ORAL
  Filled 2022-08-11 (×3): qty 1

## 2022-08-11 MED ORDER — BUPROPION HCL ER (XL) 300 MG PO TB24
300.0000 mg | ORAL_TABLET | Freq: Every day | ORAL | Status: DC
  Administered 2022-08-11 – 2022-08-13 (×3): 300 mg via ORAL
  Filled 2022-08-11 (×3): qty 1

## 2022-08-11 NOTE — TOC Initial Note (Signed)
Transition of Care [facility]) - Initial/Assessment Note    Patient Details  Name: [name] MRN: [id number] Date of Birth: [birth year]  Transition of Care [facility]) CM/SW Contact:    Vassie Moselle, [name] Phone Number: 08/11/2022, 3:24 PM  Clinical Narrative:                 Met with pt and confirmed plan for home with home health. Pt shares she has had home health services in the past but, is unsure of the agency used. Pt shares she lives with her daughter and grandson and has a RW she uses at home.  CSW spoke with pt's daughter w/ pt's consent. Pt's daughter shares that they have assistive devices in the home for pt to use. She reports that pt typically will only ambulate from bed to the bathroom or table and that she and her son assist pt when she is walking. Pt's daughter is agreeable to having home health PT arranged and reports she received home health services from [facility] most recently. Pt's daughter denies need for additional DME or home health services at this time.  HHPT has been arranged with [facility]. Home health orders will need to be placed prior to pt's discharge.   Expected Discharge Plan: [place] Barriers to Discharge: No Barriers Identified   Patient Goals and CMS Choice Patient states their goals for this hospitalization and ongoing recovery are:: To go home CMS Medicare.gov Compare Post Acute Care list provided to:: Patient Choice offered to / list presented to : Patient, Adult Children  Expected Discharge Plan and Services Expected Discharge Plan: [place] In-house Referral: NA Discharge Planning Services: NA Post Acute Care Choice: [place] arrangements for the past 2 months: Single Family Home                 DME Arranged: N/A DME Agency: NA       HH Arranged: PT HH Agency: [facility] Date [place]: 08/11/22 Time [place]: 73 Representative spoke with at [place]:  [place] Arrangements/Services Living arrangements for the past 2 months: [place] with:: Adult Children Patient language and need for interpreter reviewed:: Yes Do you feel safe going back to the place where you live?: Yes      Need for Family Participation in Patient Care: No (Comment) Care giver support system in place?: Yes (comment) (Daughter/grandson) Current home services: DME Criminal Activity/Legal Involvement Pertinent to Current Situation/Hospitalization: No - Comment as needed  Activities of Daily Living      Permission Sought/Granted Permission sought to share information with : Case Manager, Family Supports Permission granted to share information with : Yes, Verbal Permission Granted  Share Information with NAME: Nolberto Hanlon     Permission granted to share info w Relationship: Daughter  Permission granted to share info w Contact Information: [phone]  Emotional Assessment Appearance:: Appears stated age Attitude/Demeanor/Rapport: Engaged Affect (typically observed): Accepting Orientation: : Oriented to Self, Oriented to Place, Oriented to  Time, Oriented to Situation Alcohol / Substance Use: Not Applicable Psych Involvement: No (comment)  Admission diagnosis:  Bronchitis [J40] SIRS (systemic inflammatory response syndrome) (HCC) [R65.10] Patient Active Problem List   Diagnosis Date Noted   SIRS (systemic inflammatory response syndrome) ([place]) 08/10/2022   Acute bronchitis 08/10/2022   Orthostatic hypotension 05/22/2022   Chest pain 05/21/2022   Dehydration 10/19/2021   COVID-19 virus infection 10/19/2021   Hyponatremia 10/19/2021  Dementia without behavioral disturbance ([place]) 10/19/2021   Pyuria 10/19/2021   Fall    Recurrent falls 10/08/2019   Hypocalcemia 10/08/2019   Preoperative cardiovascular examination 01/12/2017   Diarrhea 12/02/2016   Essential hypertension 12/01/2016   Chronic diastolic (congestive) heart  failure ([place]) 12/01/2016   Hypertension associated with chronic kidney disease due to type 2 diabetes mellitus ([place]) 09/17/2016   Takotsubo cardiomyopathy 09/14/2016   Controlled type 2 diabetes mellitus with hyperglycemia, with long-term current use of insulin ([place]) 06/14/2016   Hyperlipidemia 06/14/2016   COPD exacerbation ([place]) 10/18/2013   Non-occlusive coronary artery disease 06/06/2012   Allergic rhinitis 01/06/2011   TOBACCO USE, QUIT 07/29/2009   Uncontrolled type 2 diabetes mellitus with complication 77/93/9688   GERD (gastroesophageal reflux disease) 04/24/2007   PCP:  [name], MD Pharmacy:   [facility], [address] [place] [address] Phone: [phone] Fax: [place] [address] Phone: [phone] Fax: [phone]     Social Determinants of Health (SDOH) Interventions    Readmission Risk Interventions    08/11/2022    3:13 PM  Readmission Risk Prevention Plan  Transportation Screening Complete  PCP or Specialist Appt within 5-7 Days Complete  Home Care Screening Complete  Medication Review (RN CM) Complete

## 2022-08-11 NOTE — Plan of Care (Signed)

## 2022-08-11 NOTE — Progress Notes (Signed)
Initial Nutrition Assessment  INTERVENTION:   -Glucerna Shake po TID, each supplement provides 220 kcal and 10 grams of protein   -Multivitamin with minerals daily  NUTRITION DIAGNOSIS:   Inadequate oral intake related to acute illness as evidenced by per patient/family report.  GOAL:   Patient will meet greater than or equal to 90% of their needs  MONITOR:   PO intake, Supplement acceptance, Labs, Weight trends, I & O's  REASON FOR ASSESSMENT:   Consult Assessment of nutrition requirement/status  ASSESSMENT:   76 y.o. female with past medical history of stage IIIa CKD, diverticulosis, depression, COPD, CVA, type 2 diabetes, GERD, history of concussion, LBBB, stage III lung cancer, nonobstructive CAD, osteoarthritis, osteoporosis, allergic rhinitis, subclinical hypothyroidism, Takotsubo cardiomyopathy, torsades de pointes was brought to the hospital with generalized weakness and difficulty standing up.  Patient unavailable at time of visit, having EKG and therapy about to visit patient. Will attempt to gather history at a later date.  Per chart review, pt with increased cough and weakness.  Currently consuming 75-100% of meals.  Per weight records, no weight loss noted.  Per nursing documentation, pt with mild BLE edema.  Medications reviewed.  Labs reviewed: CBGs: 128-356   NUTRITION - FOCUSED PHYSICAL EXAM:  Unable to complete at this time.  Diet Order:   Diet Order             Diet heart healthy/carb modified Room service appropriate? Yes; Fluid consistency: Thin  Diet effective now                   EDUCATION NEEDS:   No education needs have been identified at this time  Skin:  Skin Assessment: Reviewed RN Assessment  Last BM:  10/17 -type 6  Height:   Ht Readings from Last 1 Encounters:  08/10/22 5' (1.524 m)    Weight:   Wt Readings from Last 1 Encounters:  08/10/22 67.1 kg    BMI:  Body mass index is 28.89 kg/m.  Estimated  Nutritional Needs:   Kcal:  1650-1850  Protein:  80-90g  Fluid:  1.8L/day  Clayton Bibles, MS, RD, LDN Inpatient Clinical Dietitian Contact information available via Amion

## 2022-08-11 NOTE — Evaluation (Signed)
Occupational Therapy Evaluation Patient Details Name: [name] MRN: [id number] DOB: 1946/05/03 Today's Date: 08/11/2022   History of Present Illness Patient is 76 y.o. female who presented to [facility] on 08/10/2022 with a chief complaint of generalized weakness and inability to stand up. The patient stated that she has a chronic cough, however over the last week or her cough has become more productive associated with dyspnea, wheezing and fatigue.  Admitted with acute bronchitis and COPD exacerbation. PMH significant for stage 3a CKD, history of acute renal failure, bilateral cataracts, bilateral diabetic retinopathy, anxiety and depression, diverticulosis, diverticular abscess, COPD, history of small ischemic nonhemorrhagic CVA, type II DM, GERD, history of concussion without loss of consciousness, iron deficiency anemia, hyperlipidemia hypertension, LBBB, stage III lung cancer, nonobstructive CAD, osteoarthritis, osteoporosis, history of pneumonia, allergic rhinitis, subclinical hypothyroidism, Takotsubo cardiomyopathy, torsades de pointes.   Clinical Impression   Patient was found seated in the bedside chair. She required min guard to min assist for assessed tasks, including sit it stand, simulated lower body dressing, and ambulating using a RW. She appeared to be with slight generalized weakness and mild deconditioning. Her O2 saturation was noted to be 98% on room air at rest, and 91-92% on room air with functional activity.  She will benefit from further OT services to maximize her independence with self-care tasks and to facilitate her safe return home.      Recommendations for follow up therapy are one component of a multi-disciplinary discharge planning process, led by the attending physician.  Recommendations may be updated based on patient status, additional functional criteria and insurance authorization.   Follow Up Recommendations  No OT follow up    Assistance  Recommended at Discharge Intermittent Supervision/Assistance  Patient can return home with the following A little help with walking and/or transfers;Direct supervision/assist for medications management;Help with stairs or ramp for entrance;A little help with bathing/dressing/bathroom;Assist for transportation;Assistance with cooking/housework    Functional Status Assessment  Patient has had a recent decline in their functional status and demonstrates the ability to make significant improvements in function in a reasonable and predictable amount of time.  Equipment Recommendations  None recommended by OT       Precautions / Restrictions Precautions Precautions: Fall Restrictions Weight Bearing Restrictions: No      Mobility Bed Mobility               General bed mobility comments: pt was found seated in the bedside chair    Transfers Overall transfer level: Needs assistance Equipment used: Rolling walker (2 wheels) Transfers: Sit to/from Stand Sit to Stand: Min guard                  Balance Overall balance assessment: Needs assistance   Sitting balance-Leahy Scale: Good         Standing balance comment: min guard assist with RW              ADL either performed or assessed with clinical judgement   ADL   Eating/Feeding: Independent Eating/Feeding Details (indicate cue type and reason): based on clinical judgement Grooming: Set up;Min guard Grooming Details (indicate cue type and reason): Set-up seated or min guard standing         Upper Body Dressing : Sitting;Set up   Lower Body Dressing: Minimal assistance                          Pertinent Vitals/Pain Pain Assessment Pain  Assessment: No/denies pain, however she did report having discomfort at her R forearm IV site.     Hand Dominance Right   Extremity/Trunk Assessment Upper Extremity Assessment Upper Extremity Assessment: Within functional limits for B UE AROM and  strength   Lower Extremity Assessment Lower Extremity Assessment: Generalized weakness; B LE AROM WFL      Communication Communication Communication: HOH   Cognition Arousal/Alertness: Awake/alert Behavior During Therapy: WFL for tasks assessed/performed Overall Cognitive Status: Within Functional Limits for tasks assessed            General Comments: Oriented to person, place, and year. She reported the month to be September.         Home Living Family/patient expects to be discharged to:: Private residence Living Arrangements: Children;Other relatives (daughter and grandson) Available Help at Discharge: Family Type of Home: House Home Access: Stairs to enter Technical brewer of Steps: 4 Entrance Stairs-Rails: Left;Right Home Layout: One level     Bathroom Shower/Tub: Teacher, early years/pre: Standard Bathroom Accessibility: Yes   Home Equipment: Conservation officer, nature (2 wheels);Shower seat   Additional Comments: lives with daughter who works and 55 yo grandson who does not work      Prior Functioning/Environment Prior Level of Function : Needs assist Mobility Comments: She reported needing HHA from her family for ambulation inside & outside the home. ADLs Comments: Pt reported being independent with toileting and dressing, however her daughter assisted her with bathing. Her daughter also managed the cooking and cleaning. The pt does not drive.        OT Problem List: Decreased strength;Impaired balance (sitting and/or standing);Decreased cognition;Decreased knowledge of use of DME or AE      OT Treatment/Interventions: Self-care/ADL training;Therapeutic exercise;Therapeutic activities;Energy conservation;DME and/or AE instruction;Patient/family education;Balance training    OT Goals(Current goals can be found in the care plan section) Acute Rehab OT Goals Patient Stated Goal: To get better & return home soon OT Goal Formulation: With patient Time  For Goal Achievement: 08/25/22 Potential to Achieve Goals: Good  OT Frequency: Min 2X/week       AM-PAC OT "6 Clicks" Daily Activity     Outcome Measure Help from another person eating meals?: None Help from another person taking care of personal grooming?: A Little Help from another person toileting, which includes using toliet, bedpan, or urinal?: A Little Help from another person bathing (including washing, rinsing, drying)?: A Lot Help from another person to put on and taking off regular upper body clothing?: None Help from another person to put on and taking off regular lower body clothing?: A Little 6 Click Score: 19   End of Session Equipment Utilized During Treatment: Gait belt;Rolling walker (2 wheels) Nurse Communication: Mobility status  Activity Tolerance: Patient tolerated treatment well Patient left: in chair;with call bell/phone within reach;with chair alarm set  OT Visit Diagnosis: Unsteadiness on feet (R26.81);Muscle weakness (generalized) (M62.81)                Time: 1250-1306 OT Time Calculation (min): 16 min Charges:  OT General Charges $OT Visit: 1 Visit OT Evaluation $OT Eval Low Complexity: 1 Low    Kayah Hecker L Carie Kapuscinski, OTR/L 08/11/2022, 2:07 PM

## 2022-08-11 NOTE — Hospital Course (Addendum)
[name] is a 76 y.o. female with past medical history of stage IIIa CKD, diverticulosis, depression, COPD, CVA, type 2 diabetes, GERD, history of concussion, LBBB, stage III lung cancer, nonobstructive CAD, osteoarthritis, osteoporosis, allergic rhinitis, subclinical hypothyroidism, Takotsubo cardiomyopathy, torsades de pointes was brought to the hospital with generalized weakness and difficulty standing up.  Patient also had worsening cough with wheezing and fatigue.  In the ED patient had stable vitals.    Troponin x2 was normal.  Coronavirus PCR was negative.  CBC showed a white count of 15.5 with 80% neutrophils, hemoglobin 12.1 g/dL platelets 185.  CMP showed normal electrolytes after calcium correction.  Glucose 147, BUN 22 and creatinine 1.19 mg/dL.  LFTs were normal, except for a total protein level of 6.4 g/dL.  Chest x-ray showed no acute findings but chronic right suprahilar opacity and volume loss with pleural thickening

## 2022-08-11 NOTE — Progress Notes (Signed)
   08/11/22 1026  Provider Notification  Provider Name/Title Pokhrel  Date Provider Notified 08/11/22  Time Provider Notified 1026  Method of Notification  (secure chat)  Notification Reason Other (Comment) (QTC >500 from telemetry)  Date Critical Result Received 08/11/22  Time Critical Result Received 1027  Provider response Other (Comment) (MD notified)   EKG done. See new orders.

## 2022-08-11 NOTE — Progress Notes (Signed)
PROGRESS NOTE    [name]  LGX:[id number] DOB: 1946-05-12 DOA: 08/10/2022 PCP: [name], MD    Brief Narrative:  [name] is a 76 y.o. female with past medical history of stage IIIa CKD, diverticulosis, depression, COPD, CVA, type 2 diabetes, GERD, history of concussion, LBBB, stage III lung cancer, nonobstructive CAD, osteoarthritis, osteoporosis, allergic rhinitis, subclinical hypothyroidism, Takotsubo cardiomyopathy, torsades de pointes was brought to the hospital with generalized weakness and difficulty standing up.  Patient also had worsening cough with wheezing and fatigue.  In the ED, patient had stable vitals.    Troponin x2 was normal.  Coronavirus PCR was negative.  CBC showed a white count of 15.5 with 80% neutrophils, hemoglobin 12.1 g/dL platelets 185.  CMP showed normal electrolytes after calcium correction.  Glucose 147, BUN 22 and creatinine 1.19 mg/dL.  LFTs were normal, except for a total protein level of 6.4 g/dL.  Chest x-ray showed no acute findings but chronic right suprahilar opacity and volume loss with pleural thickening.  Assessment and plan.  Principal Problem:   SIRS (systemic inflammatory response syndrome) (HCC) Active Problems:   Dementia without behavioral disturbance (HCC)   GERD (gastroesophageal reflux disease)   COPD exacerbation (HCC)   Controlled type 2 diabetes mellitus with hyperglycemia, with long-term current use of insulin (HCC)   Hyperlipidemia   Essential hypertension   Chronic diastolic (congestive) heart failure (HCC)   Acute bronchitis   SIRS (systemic inflammatory response syndrome) (HCC)  Acute bronchitis with  COPD exacerbation (HCC) Continue oxygen nebulizers and bronchodilators, Rocephin and Zithromax.  COVID test negative.  Urinalysis unremarkable.  Strep urinary antigen negative.  Lactate has improved to 1.5 from 2.1.  Pending sputum for Gram stain culture and sensitivity.  Procalcitonin less than 0.1.   WBC mildly elevated at 11.6.  Blood culture with gram-positive cocci in aerobic bottle only.  We will continue current antibiotic.  Afebrile this morning.  Temperature max of 98.2 F.  Staph bacteremia.  2 out of 4 bottles.  On azithromycin and Rocephin.  We will repeat cultures and monitor.  We will continue with current antibiotic.    Dementia without behavioral disturbance ([place]) Continue supportive care.  On Wellbutrin at home.     GERD (gastroesophageal reflux disease) Continue PPI.     Controlled type 2 diabetes mellitus with hyperglycemia,  with long-term current use of insulin (HCC) Latest hemoglobin A1c of 7.2.  Continue carb controlled diet sliding scale insulin.     Hyperlipidemia Continue Lipitor     Essential hypertension Currently not on medication.  Patient is not taking metoprolol listed in MAR.     Chronic diastolic (congestive) heart failure (HCC) No signs of decompensation.    DVT prophylaxis: enoxaparin (LOVENOX) injection 40 mg Start: 08/10/22 2200   Code Status:     Code Status: Full Code  Disposition: Likely home with home health.  We will get PT evaluation.  Status is: Observation  The patient will require care spanning > 2 midnights and should be moved to inpatient because: IV antibiotics, bacteremia, need for repeat cultures, pending clinical improvement, PT evaluation   Family Communication:  Spoke with the patient's daughter on the phone and updated her about the clinical condition of the patient.  Consultants:  None  Procedures:  None  Antimicrobials:  Rocephin and Zithromax  Anti-infectives (From admission, onward)    Start     Dose/Rate Route Frequency Ordered Stop   08/11/22 1000  cefTRIAXone (ROCEPHIN) 2 g in sodium  chloride 0.9 % 100 mL IVPB        2 g 200 mL/hr over 30 Minutes Intravenous Every 24 hours 08/10/22 1214 08/16/22 0959   08/11/22 1000  azithromycin (ZITHROMAX) 500 mg in sodium chloride 0.9 % 250 mL IVPB        500  mg 250 mL/hr over 60 Minutes Intravenous Every 24 hours 08/10/22 1214 08/15/22 0959   08/10/22 1230  cefTRIAXone (ROCEPHIN) 1 g in sodium chloride 0.9 % 100 mL IVPB        1 g 200 mL/hr over 30 Minutes Intravenous STAT 08/10/22 1217 08/10/22 1354   08/10/22 1115  cefTRIAXone (ROCEPHIN) 1 g in sodium chloride 0.9 % 100 mL IVPB        1 g 200 mL/hr over 30 Minutes Intravenous  Once 08/10/22 1107 08/10/22 1203   08/10/22 1115  azithromycin (ZITHROMAX) 500 mg in sodium chloride 0.9 % 250 mL IVPB        500 mg 250 mL/hr over 60 Minutes Intravenous  Once 08/10/22 1109 08/10/22 1234      Subjective: Today, patient was seen and examined at bedside.  Patient states that she feels okay.  Having some cough but no chest pain.  Denies any fever chills runny nose sore throat.  Stated that she did ambulate to the bathroom.  Patient lives with her daughter and has a walker at home.  Objective: Vitals:   08/10/22 2048 08/10/22 2122 08/11/22 0430 08/11/22 0759  BP: (!) 116/58  (!) 119/58   Pulse: 95  82   Resp: (!) 21  18   Temp: 98.2 F (36.8 C)  (!) 97.5 F (36.4 C)   TempSrc:      SpO2: 94% 96% 97% 98%  Weight:      Height:        Intake/Output Summary (Last 24 hours) at 08/11/2022 1126 Last data filed at 08/11/2022 1018 Gross per 24 hour  Intake 580 ml  Output 1200 ml  Net -620 ml   Filed Weights   08/10/22 0924 08/10/22 1634  Weight: 63.8 kg 67.1 kg    Physical Examination: Body mass index is 28.89 kg/m.  General:  Average built, not in obvious distress HENT:   No scleral pallor or icterus noted. Oral mucosa is moist.  Chest:  Diminished breath sounds bilaterally.  No obvious wheezing noted. CVS: S1 &S2 heard. No murmur.  Regular rate and rhythm. Abdomen: Soft, nontender, nondistended.  Bowel sounds are heard.   Extremities: No cyanosis, clubbing, no edema.  Peripheral pulses are palpable. Psych: Alert, awake and oriented, normal mood CNS:  No cranial nerve deficits.  Power  equal in all extremities.   Skin: Warm and dry.  No rashes noted.  Data Reviewed:   CBC: Recent Labs  Lab 08/10/22 0922 08/11/22 0521  WBC 15.5* 11.6*  NEUTROABS 12.3* 10.2*  HGB 12.1 11.2*  HCT 38.6 35.3*  MCV 90.8 90.1  PLT 185 458    Basic Metabolic Panel: Recent Labs  Lab 08/10/22 0922 08/11/22 0521  NA 141 137  K 3.9 4.0  CL 108 109  CO2 25 21*  GLUCOSE 147* 233*  BUN 22 23  CREATININE 1.19* 0.90  CALCIUM 8.5* 8.3*    Liver Function Tests: Recent Labs  Lab 08/10/22 0922 08/11/22 0521  AST 19 13*  ALT 13 13  ALKPHOS 75 64  BILITOT 0.7 0.5  PROT 6.4* 5.9*  ALBUMIN 3.5 3.0*     Radiology Studies: DG Chest Portable 1  View  Result Date: 08/10/2022 CLINICAL DATA:  Sepsis EXAM: PORTABLE CHEST 1 VIEW COMPARISON:  07/14/2022 FINDINGS: Chronic right suprahilar opacity and volume loss with pleural thickening, site of remote treated lung cancer. Normal heart size and aortic contours. No acute pulmonary opacity. Remote right proximal humerus fracture. No acute osseous finding. IMPRESSION: No acute finding when compared to priors. Electronically Signed   By: Jorje Guild M.D.   On: 08/10/2022 09:45      LOS: 0 days    Flora Lipps, MD [facility] Available via Epic secure chat 7am-7pm After these hours, please refer to coverage provider listed on [email or web address] 08/11/2022, 11:26 AM

## 2022-08-11 NOTE — Progress Notes (Signed)
PHARMACY - PHYSICIAN COMMUNICATION CRITICAL VALUE ALERT - BLOOD CULTURE IDENTIFICATION (BCID)  [name] is an 76 y.o. female who presented to [facility] on 08/10/2022 with a chief complaint of generalized weakness and inability to stand up. The patient stated that she has a chronic cough, however over the last week or her cough has become more productive associated with dyspnea, wheezing and fatigue.  Admitted with acute bronchitis and COPD exacerbation.  Assessment:  10/18 BCID Staph species, no resistance. In 2 of 4 bottles - aerobic bottles  Name of physician (or Provider) Contacted: Pokhrel  Current antibiotics: Azithromycin and Ceftriaxone  Changes to prescribed antibiotics recommended:  No changes at this time.  Recommend to repeat cultures and monitor.   Results for orders placed or performed during the hospital encounter of 08/10/22  Blood Culture ID Panel (Reflexed) (Collected: 08/10/2022  9:27 AM)  Result Value Ref Range   Enterococcus faecalis NOT DETECTED NOT DETECTED   Enterococcus Faecium NOT DETECTED NOT DETECTED   Listeria monocytogenes NOT DETECTED NOT DETECTED   Staphylococcus species DETECTED (A) NOT DETECTED   Staphylococcus aureus (BCID) NOT DETECTED NOT DETECTED   Staphylococcus epidermidis NOT DETECTED NOT DETECTED   Staphylococcus lugdunensis NOT DETECTED NOT DETECTED   Streptococcus species NOT DETECTED NOT DETECTED   Streptococcus agalactiae NOT DETECTED NOT DETECTED   Streptococcus pneumoniae NOT DETECTED NOT DETECTED   Streptococcus pyogenes NOT DETECTED NOT DETECTED   A.calcoaceticus-baumannii NOT DETECTED NOT DETECTED   Bacteroides fragilis NOT DETECTED NOT DETECTED   Enterobacterales NOT DETECTED NOT DETECTED   Enterobacter cloacae complex NOT DETECTED NOT DETECTED   Escherichia coli NOT DETECTED NOT DETECTED   Klebsiella aerogenes NOT DETECTED NOT DETECTED   Klebsiella oxytoca NOT DETECTED NOT DETECTED   Klebsiella pneumoniae NOT  DETECTED NOT DETECTED   Proteus species NOT DETECTED NOT DETECTED   Salmonella species NOT DETECTED NOT DETECTED   Serratia marcescens NOT DETECTED NOT DETECTED   Haemophilus influenzae NOT DETECTED NOT DETECTED   Neisseria meningitidis NOT DETECTED NOT DETECTED   Pseudomonas aeruginosa NOT DETECTED NOT DETECTED   Stenotrophomonas maltophilia NOT DETECTED NOT DETECTED   Candida albicans NOT DETECTED NOT DETECTED   Candida auris NOT DETECTED NOT DETECTED   Candida glabrata NOT DETECTED NOT DETECTED   Candida krusei NOT DETECTED NOT DETECTED   Candida parapsilosis NOT DETECTED NOT DETECTED   Candida tropicalis NOT DETECTED NOT DETECTED   Cryptococcus neoformans/gattii NOT DETECTED NOT DETECTED    Suzzanne Cloud, PharmD, BCPS 08/11/2022  9:42 AM

## 2022-08-11 NOTE — Evaluation (Signed)
Physical Therapy Evaluation Patient Details Name: [name] MRN: [id number] DOB: Jul 04, 1946 Today's Date: 08/11/2022  History of Present Illness  Patient is 76 y.o. female who presented to [facility] on 08/10/2022 with a chief complaint of generalized weakness and inability to stand up. The patient stated that she has a chronic cough, however over the last week or her cough has become more productive associated with dyspnea, wheezing and fatigue.  Admitted with acute bronchitis and COPD exacerbation. PMH significant for stage 3a CKD, history of acute renal failure, bilateral cataracts, bilateral diabetic retinopathy, anxiety and depression, diverticulosis, diverticular abscess, COPD, history of small ischemic nonhemorrhagic CVA, type II DM, GERD, history of concussion without loss of consciousness, iron deficiency anemia, hyperlipidemia hypertension, LBBB, stage III lung cancer, nonobstructive CAD, osteoarthritis, osteoporosis, history of pneumonia, allergic rhinitis, subclinical hypothyroidism, Takotsubo cardiomyopathy, torsades de pointes.    Clinical Impression  [name] is 76 y.o. female admitted with above HPI and diagnosis. Patient is currently limited by functional impairments below (see PT problem list). Patient lives with her daughter and grandson and requires assist for transfers, gait, and ADL's at baseline. Currently she requires min assist for sit<>stands and to ambulate ~80' with RW. HR stable in 100's with gait and SpO2 90% or better on RA during mobility. EOS pt resting in recliner and verbalized understanding to call for assist, chair alarm on. Patient will benefit from continued skilled PT interventions to address impairments and progress independence with mobility, recommending HHPT with constat supervision/assist from family. Acute PT will follow and progress as able.        Recommendations for follow up therapy are one component of a multi-disciplinary  discharge planning process, led by the attending physician.  Recommendations may be updated based on patient status, additional functional criteria and insurance authorization.  Follow Up Recommendations Home health PT      Assistance Recommended at Discharge Frequent or constant Supervision/Assistance  Patient can return home with the following  A little help with walking and/or transfers;A little help with bathing/dressing/bathroom;Assistance with cooking/housework;Direct supervision/assist for medications management;Assist for transportation;Help with stairs or ramp for entrance    Equipment Recommendations None recommended by PT  Recommendations for Other Services       Functional Status Assessment Patient has had a recent decline in their functional status and demonstrates the ability to make significant improvements in function in a reasonable and predictable amount of time.     Precautions / Restrictions Precautions Precautions: Fall Restrictions Weight Bearing Restrictions: No      Mobility  Bed Mobility Overal bed mobility: Needs Assistance Bed Mobility: Supine to Sit     Supine to sit: Min assist, HOB elevated     General bed mobility comments: pt taking extra time, min assist with bed pad to pivot hips and sit up EOB.    Transfers Overall transfer level: Needs assistance Equipment used: Rolling walker (2 wheels) Transfers: Sit to/from Stand Sit to Stand: Min assist           General transfer comment: cues for hand placement with power up. min assist to rise fully from EOB.    Ambulation/Gait Ambulation/Gait assistance: Min assist Gait Distance (Feet): 80 Feet Assistive device: Rolling walker (2 wheels) Gait Pattern/deviations: Step-through pattern, Decreased step length - right, Decreased step length - left, Decreased stride length, Shuffle, Trunk flexed Gait velocity: Chief Operating Officer  Modified Rankin  (Stroke Patients Only)       Balance Overall balance assessment: Needs assistance Sitting-balance support: Feet supported Sitting balance-Leahy Scale: Fair     Standing balance support: During functional activity, Bilateral upper extremity supported, Reliant on assistive device for balance Standing balance-Leahy Scale: Poor                               Pertinent Vitals/Pain Pain Assessment Pain Assessment: No/denies pain    Home Living Family/patient expects to be discharged to:: Private residence Living Arrangements: Children;Other relatives Available Help at Discharge: Family;Friend(s);Available 24 hours/day Type of Home: House Home Access: Stairs to enter Entrance Stairs-Rails: Psychiatric nurse of Steps: 4-5   Home Layout: One level Home Equipment: Conservation officer, nature (2 wheels);BSC/3in1;Shower seat Additional Comments: lives with daughter who works and 56 yo grandson who does not work    Prior Function Prior Level of Function : Needs assist  Cognitive Assist : Mobility (cognitive);ADLs (cognitive) Mobility (Cognitive): Intermittent cues ADLs (Cognitive): Intermittent cues Physical Assist : Mobility (physical);ADLs (physical) Mobility (physical): Bed mobility;Gait ADLs (physical): Bathing;Dressing;Toileting Mobility Comments: pt contradicting information from prior hospitalization in July this year stating she has had no falls and does not need help. Then pt states states family assists wtih getting OOB and ambulating in home. Per prior encounter: she "sometimes" walks with a walker. Endorses "at least" 10 falls in the month before admission in July and attributes her falls to getting dizzy. Also reports tendency for posterior lean during ambulation - grandson walks behind her for safety. ADLs Comments: pt contradicting report from prior hospitalization stating she does it all herself but then corrected herslef that her family assist with toilet  transfers, and dressing. no family present to confirm.     Hand Dominance   Dominant Hand: Right    Extremity/Trunk Assessment   Upper Extremity Assessment Upper Extremity Assessment: Generalized weakness    Lower Extremity Assessment Lower Extremity Assessment: Generalized weakness    Cervical / Trunk Assessment Cervical / Trunk Assessment: Normal  Communication   Communication: HOH  Cognition Arousal/Alertness: Awake/alert Behavior During Therapy: WFL for tasks assessed/performed Overall Cognitive Status: Within Functional Limits for tasks assessed                                          General Comments      Exercises     Assessment/Plan    PT Assessment Patient needs continued PT services  PT Problem List Decreased strength;Decreased range of motion;Decreased activity tolerance;Decreased balance;Decreased mobility;Decreased knowledge of use of DME;Decreased safety awareness;Decreased knowledge of precautions       PT Treatment Interventions DME instruction;Gait training;Stair training;Functional mobility training;Therapeutic activities;Therapeutic exercise;Balance training;Neuromuscular re-education;Patient/family education;Cognitive remediation    PT Goals (Current goals can be found in the Care Plan section)  Acute Rehab PT Goals Patient Stated Goal: get home PT Goal Formulation: With patient Time For Goal Achievement: 08/25/22 Potential to Achieve Goals: Good    Frequency Min 3X/week     Co-evaluation               AM-PAC PT "6 Clicks" Mobility  Outcome Measure Help needed turning from your back to your side while in a flat bed without using bedrails?: A Little Help needed moving from lying on your back to sitting on the side of a flat  bed without using bedrails?: A Little Help needed moving to and from a bed to a chair (including a wheelchair)?: A Little Help needed standing up from a chair using your arms (e.g., wheelchair  or bedside chair)?: A Little Help needed to walk in hospital room?: A Little Help needed climbing 3-5 steps with a railing? : A Lot 6 Click Score: 17    End of Session Equipment Utilized During Treatment: Gait belt Activity Tolerance: Patient tolerated treatment well Patient left: in chair;with call bell/phone within reach;with chair alarm set Nurse Communication: Mobility status PT Visit Diagnosis: Muscle weakness (generalized) (M62.81);Difficulty in walking, not elsewhere classified (R26.2)    Time: 5391-2258 PT Time Calculation (min) (ACUTE ONLY): 25 min   Charges:   PT Evaluation $PT Eval Low Complexity: 1 Low PT Treatments $Gait Training: 8-22 mins        Verner Mould, DPT Acute Rehabilitation Services Office [phone]  08/11/22 1:12 PM

## 2022-08-12 LAB — GLUCOSE, CAPILLARY
Glucose-Capillary: 132 mg/dL — ABNORMAL HIGH (ref 70–99)
Glucose-Capillary: 133 mg/dL — ABNORMAL HIGH (ref 70–99)
Glucose-Capillary: 283 mg/dL — ABNORMAL HIGH (ref 70–99)
Glucose-Capillary: 191 mg/dL — ABNORMAL HIGH (ref 70–99)

## 2022-08-12 MED ORDER — MELATONIN 3 MG PO TABS
3.0000 mg | ORAL_TABLET | Freq: Once | ORAL | Status: AC
  Administered 2022-08-12: 3 mg via ORAL
  Filled 2022-08-12: qty 1

## 2022-08-12 NOTE — TOC Progression Note (Signed)
Transition of Care [facility]) - Progression Note    Patient Details  Name: Aryana Wonnacott MRN: [id number] Date of Birth: [birth year]  [facility] CM/SW Contact  Servando Snare, [place] Phone Number: 08/12/2022, 2:00 PM  Clinical Narrative:    [place] notified of [facility] orders and expected dc date.    Expected Discharge Plan: [place] Barriers to Discharge: No Barriers Identified  Expected Discharge Plan and Services Expected Discharge Plan: [place] In-house Referral: NA Discharge Planning Services: NA Post Acute Care Choice: [place] arrangements for the past 2 months: Single Family Home                 DME Arranged: N/A DME Agency: NA       HH Arranged: PT HH Agency: [place] Date [place]: 08/11/22 Time [place]: 2217 Representative spoke with at [place]: [place] Interventions    Readmission Risk Interventions    08/11/2022    3:13 PM  Readmission Risk Prevention Plan  Transportation Screening Complete  PCP or Specialist Appt within 5-7 Days Complete  Home Care Screening Complete  Medication Review (RN CM) Complete

## 2022-08-12 NOTE — Progress Notes (Signed)
PROGRESS NOTE    [name]  RJJ:[id number] DOB: 1945-12-25 DOA: 08/10/2022 PCP: [name], MD    Brief Narrative:  [name] is a 76 y.o. female with past medical history of stage IIIa CKD, diverticulosis, depression, COPD, CVA, type 2 diabetes, GERD, history of concussion, LBBB, stage III lung cancer, nonobstructive CAD, osteoarthritis, osteoporosis, allergic rhinitis, subclinical hypothyroidism, Takotsubo cardiomyopathy, torsades de pointes was brought to the hospital with generalized weakness and difficulty standing up.  Patient also had worsening cough with wheezing and fatigue.  In the ED, patient had stable vitals.    Troponin x2 was normal.  Coronavirus PCR was negative.  CBC showed a white count of 15.5 with 80% neutrophils, hemoglobin 12.1 g/dL platelets 185.  CMP showed normal electrolytes after calcium correction.  Glucose 147, BUN 22 and creatinine 1.19 mg/dL.  LFTs were normal, except for a total protein level of 6.4 g/dL.  Chest x-ray showed no acute findings but chronic right suprahilar opacity and volume loss with pleural thickening.  Assessment and plan.  Principal Problem:   SIRS (systemic inflammatory response syndrome) (HCC) Active Problems:   Dementia without behavioral disturbance (HCC)   GERD (gastroesophageal reflux disease)   COPD exacerbation (HCC)   Controlled type 2 diabetes mellitus with hyperglycemia, with long-term current use of insulin (HCC)   Hyperlipidemia   Essential hypertension   Chronic diastolic (congestive) heart failure (HCC)   Acute bronchitis   SIRS (systemic inflammatory response syndrome) (HCC)  Acute bronchitis with  COPD exacerbation (HCC) Continue oxygen nebulizers and bronchodilators, Rocephin and Zithromax.  COVID test negative.  Urinalysis unremarkable.  Strep urinary antigen negative.  Lactate has improved to 1.5 from 2.1.  Pending sputum for Gram stain culture and sensitivity.  Procalcitonin less than 0.1.   WBC mildly elevated at 11.6.   We will continue current antibiotic.  Afebrile,  Temperature max of 97.9.  Staph hominis bacteremia.  2 out of 4 bottles.  On azithromycin and Rocephin.  Repeat cultures negative in less than 12 hours.  We will continue with current antibiotic.    Dementia without behavioral disturbance ([place]) Continue supportive care.  On Wellbutrin at home.     GERD (gastroesophageal reflux disease) Continue PPI.     Controlled type 2 diabetes mellitus with hyperglycemia,  with long-term current use of insulin (HCC) Latest hemoglobin A1c of 7.2.  Continue carb controlled diet sliding scale insulin.     Hyperlipidemia Continue Lipitor     Essential hypertension Currently not on medication.  Patient is not taking metoprolol listed in MAR.     Chronic diastolic (congestive) heart failure (HCC) No signs of decompensation.    DVT prophylaxis: enoxaparin (LOVENOX) injection 40 mg Start: 08/10/22 2200   Code Status:     Code Status: Full Code  Disposition: Likely home with home health as per PT recommendation on 08/13/2022.   Status is: Inpatient  The patient is inpatient because: IV antibiotics, bacteremia,  repeat cultures,    Family Communication:  Spoke with the patient's daughter on the phone and updated her about the clinical condition of the patient on 08/11/2022  Consultants:  None  Procedures:  None  Antimicrobials:  Rocephin and Zithromax IV  Anti-infectives (From admission, onward)    Start     Dose/Rate Route Frequency Ordered Stop   08/11/22 1000  cefTRIAXone (ROCEPHIN) 2 g in sodium chloride 0.9 % 100 mL IVPB        2 g 200 mL/hr over 30 Minutes Intravenous  Every 24 hours 08/10/22 1214 08/16/22 0959   08/11/22 1000  azithromycin (ZITHROMAX) 500 mg in sodium chloride 0.9 % 250 mL IVPB        500 mg 250 mL/hr over 60 Minutes Intravenous Every 24 hours 08/10/22 1214 08/15/22 0959   08/10/22 1230  cefTRIAXone (ROCEPHIN) 1 g in sodium chloride  0.9 % 100 mL IVPB        1 g 200 mL/hr over 30 Minutes Intravenous STAT 08/10/22 1217 08/10/22 1354   08/10/22 1115  cefTRIAXone (ROCEPHIN) 1 g in sodium chloride 0.9 % 100 mL IVPB        1 g 200 mL/hr over 30 Minutes Intravenous  Once 08/10/22 1107 08/10/22 1203   08/10/22 1115  azithromycin (ZITHROMAX) 500 mg in sodium chloride 0.9 % 250 mL IVPB        500 mg 250 mL/hr over 60 Minutes Intravenous  Once 08/10/22 1109 08/10/22 1234      Subjective: Today, patient was seen and examined at bedside.  Feels better.  Denies any fever chills nausea vomiting.  Has mild cough but no chest pain.   Patient lives with her daughter and has a walker at home.  Objective: Vitals:   08/11/22 1959 08/12/22 0553 08/12/22 0800 08/12/22 0802  BP: (!) 101/41 (!) 114/58    Pulse: 91 88    Resp: 20 20 16    Temp: (!) 97.5 F (36.4 C) 97.7 F (36.5 C)    TempSrc: Oral Oral    SpO2: 100% 98% 96% 96%  Weight:      Height:        Intake/Output Summary (Last 24 hours) at 08/12/2022 1321 Last data filed at 08/12/2022 0938 Gross per 24 hour  Intake 470 ml  Output 1500 ml  Net -1030 ml    Filed Weights   08/10/22 0924 08/10/22 1634  Weight: 63.8 kg 67.1 kg    Physical Examination: Body mass index is 28.89 kg/m.   General:  Average built, not in obvious distress HENT:   No scleral pallor or icterus noted. Oral mucosa is moist.  Chest:  Clear breath sounds.  Diminished breath sounds bilaterally. No crackles or wheezes.  CVS: S1 &S2 heard. No murmur.  Regular rate and rhythm. Abdomen: Soft, nontender, nondistended.  Bowel sounds are heard.   Extremities: No cyanosis, clubbing or edema.  Peripheral pulses are palpable. Psych: Alert, awake and Communicative, CNS:  No cranial nerve deficits.  Power equal in all extremities.   Skin: Warm and dry.  No rashes noted.   Data Reviewed:   CBC: Recent Labs  Lab 08/10/22 0922 08/11/22 0521  WBC 15.5* 11.6*  NEUTROABS 12.3* 10.2*  HGB 12.1 11.2*   HCT 38.6 35.3*  MCV 90.8 90.1  PLT 185 169     Basic Metabolic Panel: Recent Labs  Lab 08/10/22 0922 08/11/22 0521  NA 141 137  K 3.9 4.0  CL 108 109  CO2 25 21*  GLUCOSE 147* 233*  BUN 22 23  CREATININE 1.19* 0.90  CALCIUM 8.5* 8.3*  MG  --  2.4     Liver Function Tests: Recent Labs  Lab 08/10/22 0922 08/11/22 0521  AST 19 13*  ALT 13 13  ALKPHOS 75 64  BILITOT 0.7 0.5  PROT 6.4* 5.9*  ALBUMIN 3.5 3.0*      Radiology Studies: No results found.    LOS: 1 day    Flora Lipps, MD [facility] Available via Epic secure chat 7am-7pm After these hours,  please refer to coverage provider listed on [email or web address] 08/12/2022, 1:21 PM

## 2022-08-13 LAB — CBC
HCT: 35.7 % — ABNORMAL LOW (ref 36.0–46.0)
Hemoglobin: 11.3 g/dL — ABNORMAL LOW (ref 12.0–15.0)
MCH: 28.3 pg (ref 26.0–34.0)
MCHC: 31.7 g/dL (ref 30.0–36.0)
MCV: 89.3 fL (ref 80.0–100.0)
Platelets: 208 10*3/uL (ref 150–400)
RBC: 4 MIL/uL (ref 3.87–5.11)
RDW: 15.1 % (ref 11.5–15.5)
WBC: 8.9 10*3/uL (ref 4.0–10.5)
nRBC: 0 % (ref 0.0–0.2)

## 2022-08-13 LAB — GLUCOSE, CAPILLARY
Glucose-Capillary: 96 mg/dL (ref 70–99)
Glucose-Capillary: 148 mg/dL — ABNORMAL HIGH (ref 70–99)

## 2022-08-13 LAB — BASIC METABOLIC PANEL
Anion gap: 6 (ref 5–15)
BUN: 25 mg/dL — ABNORMAL HIGH (ref 8–23)
CO2: 28 mmol/L (ref 22–32)
Calcium: 9.6 mg/dL (ref 8.9–10.3)
Chloride: 105 mmol/L (ref 98–111)
Creatinine, Ser: 0.97 mg/dL (ref 0.44–1.00)
GFR, Estimated: >60 mL/min (ref >60)
Glucose, Bld: 116 mg/dL — ABNORMAL HIGH (ref 70–99)
Potassium: 4.2 mmol/L (ref 3.5–5.1)
Sodium: 139 mmol/L (ref 135–145)

## 2022-08-13 LAB — MAGNESIUM: Magnesium: 2.3 mg/dL (ref 1.7–2.4)

## 2022-08-13 MED ORDER — PREDNISONE 10 MG PO TABS: ORAL_TABLET | ORAL | 0 refills | Status: AC

## 2022-08-13 MED ORDER — GUAIFENESIN-DM 100-10 MG/5ML PO SYRP: 5.0000 mL | ORAL_SOLUTION | ORAL | 0 refills | Status: DC | PRN

## 2022-08-13 MED ORDER — CEFDINIR 300 MG PO CAPS: 300.0000 mg | ORAL_CAPSULE | Freq: Two times a day (BID) | ORAL | 0 refills | Status: AC

## 2022-08-13 MED ORDER — AZITHROMYCIN 500 MG PO TABS: 500.0000 mg | ORAL_TABLET | Freq: Every day | ORAL | 0 refills | Status: AC

## 2022-08-13 NOTE — Progress Notes (Signed)
Physical Therapy Treatment Patient Details Name: [name] MRN: [id number] DOB: 01/16/1946 Today's Date: 08/13/2022   History of Present Illness Patient is 76 y.o. female who presented to [facility] on 08/10/2022 with a chief complaint of generalized weakness and inability to stand up. The patient stated that she has a chronic cough, however over the last week or her cough has become more productive associated with dyspnea, wheezing and fatigue.  Admitted with acute bronchitis and COPD exacerbation. PMH significant for stage 3a CKD, history of acute renal failure, bilateral cataracts, bilateral diabetic retinopathy, anxiety and depression, diverticulosis, diverticular abscess, COPD, history of small ischemic nonhemorrhagic CVA, type II DM, GERD, history of concussion without loss of consciousness, iron deficiency anemia, hyperlipidemia hypertension, LBBB, stage III lung cancer, nonobstructive CAD, osteoarthritis, osteoporosis, history of pneumonia, allergic rhinitis, subclinical hypothyroidism, Takotsubo cardiomyopathy, torsades de pointes.    PT Comments    Patient is very sweet and ready walk, asking when she is going home.  Patient ambulated x 80' with Rw, occasional nonproductive cough . SPO2 96% on RA.  Recommendations for follow up therapy are one component of a multi-disciplinary discharge planning process, led by the attending physician.  Recommendations may be updated based on patient status, additional functional criteria and insurance authorization.  Follow Up Recommendations  Home health PT     Assistance Recommended at Discharge Frequent or constant Supervision/Assistance  Patient can return home with the following A little help with walking and/or transfers;A little help with bathing/dressing/bathroom;Assistance with cooking/housework;Direct supervision/assist for medications management;Assist for transportation;Help with stairs or ramp for entrance   Equipment  Recommendations  None recommended by PT    Recommendations for Other Services       Precautions / Restrictions Precautions Precautions: Fall Precaution Comments: check satts     Mobility  Bed Mobility               General bed mobility comments: in recliner    Transfers   Equipment used: Rolling walker (2 wheels) Transfers: Sit to/from Stand Sit to Stand: Min assist           General transfer comment: counts  123 to rise up    Ambulation/Gait Ambulation/Gait assistance: Min assist Gait Distance (Feet): 80 Feet Assistive device: Rolling walker (2 wheels) Gait Pattern/deviations: Step-through pattern, Decreased step length - right, Decreased step length - left, Decreased stride length, Shuffle, Trunk flexed Gait velocity: decr         Stairs             Wheelchair Mobility    Modified Rankin (Stroke Patients Only)       Balance   Sitting-balance support: Feet supported Sitting balance-Leahy Scale: Good     Standing balance support: During functional activity, Bilateral upper extremity supported, Reliant on assistive device for balance Standing balance-Leahy Scale: Poor Standing balance comment: tends to posterior lean initially standing up                            Cognition Arousal/Alertness: Awake/alert Behavior During Therapy: WFL for tasks assessed/performed Overall Cognitive Status: Within Functional Limits for tasks assessed                                          Exercises      General Comments        Pertinent Vitals/Pain  Pain Assessment Pain Assessment: No/denies pain    Home Living                          Prior Function            PT Goals (current goals can now be found in the care plan section) Progress towards PT goals: Progressing toward goals    Frequency    Min 3X/week      PT Plan Current plan remains appropriate    Co-evaluation               AM-PAC PT "6 Clicks" Mobility   Outcome Measure  Help needed turning from your back to your side while in a flat bed without using bedrails?: A Little Help needed moving from lying on your back to sitting on the side of a flat bed without using bedrails?: A Little Help needed moving to and from a bed to a chair (including a wheelchair)?: A Little Help needed standing up from a chair using your arms (e.g., wheelchair or bedside chair)?: A Little Help needed to walk in hospital room?: A Little Help needed climbing 3-5 steps with a railing? : A Lot 6 Click Score: 17    End of Session Equipment Utilized During Treatment: Gait belt Activity Tolerance: Patient tolerated treatment well Patient left: in chair;with call bell/phone within reach;with chair alarm set Nurse Communication: Mobility status PT Visit Diagnosis: Muscle weakness (generalized) (M62.81);Difficulty in walking, not elsewhere classified (R26.2)     Time: 0093-8182 PT Time Calculation (min) (ACUTE ONLY): 29 min  Charges:  $Gait Training: 23-37 mins                     [place] Office [phone] Weekend [phone]    Claretha Cooper 08/13/2022, 1:59 PM

## 2022-08-13 NOTE — Discharge Summary (Signed)
Physician Discharge Summary   Patient: [name] MRN: [id number] DOB: 1945/12/25  Admit date:     08/10/2022  Discharge date: 08/13/22  Discharge Physician: Flora Lipps   PCP: [name], MD   Recommendations at discharge:   Follow-up with your primary care physician in 1 week.   Check CBC, BMP, magnesium, LFT in the next visit.  Discharge Diagnoses: Active Problems:   Dementia without behavioral disturbance (HCC)   GERD (gastroesophageal reflux disease)   Controlled type 2 diabetes mellitus with hyperglycemia, with long-term current use of insulin (HCC)   Hyperlipidemia   Essential hypertension   Chronic diastolic (congestive) heart failure (HCC)  Principal Problem (Resolved):   SIRS (systemic inflammatory response syndrome) (HCC) Resolved Problems:   COPD exacerbation (HCC)   Acute bronchitis  Hospital Course: Tatiyanna Lashley is a 76 y.o. female with past medical history of stage IIIa CKD, diverticulosis, depression, COPD, CVA, type 2 diabetes, GERD, history of concussion, LBBB, stage III lung cancer, nonobstructive CAD, osteoarthritis, osteoporosis, allergic rhinitis, subclinical hypothyroidism, Takotsubo cardiomyopathy, torsades de pointes was brought to the hospital with generalized weakness and difficulty standing up.  Patient also had worsening cough with wheezing and fatigue.  In the ED, patient had stable vitals.  Troponin x2 was normal.  Coronavirus PCR was negative.  CBC showed a white count of 15.5 with 80% neutrophils, hemoglobin 12.1 g/dL platelets 185.  CMP showed normal electrolytes after calcium correction.  Glucose 147, BUN 22 and creatinine 1.19 mg/dL.  LFTs were normal, except for a total protein level of 6.4 g/dL.  Chest x-ray showed no acute findings but chronic right suprahilar opacity and volume loss with pleural thickening.  Following conditions were addressed during hospitalization,  SIRS (systemic inflammatory response syndrome  likely secondary to  Acute bronchitis with  COPD exacerbation ([place]) Patient received oxygen, nebulizers and bronchodilators, Rocephin and Zithromax during hospitalization.  Has clinically improved.  We will continue Omnicef and Zithromax for next few days to complete 5-day course.Marland Kitchen COVID test was negative.  Urinalysis unremarkable.  Strep urinary antigen negative.  Lactate has improved to 1.5 from 2.1.   Procalcitonin less than 0.1.  WBC has normalized.  Patient is afebrile.  Staph hominis bacteremia.  2 out of 4 bottles.  Received azithromycin and Rocephin.  Repeat blood cultures cultures negative in 2 days.  We will continue Omnicef and Zithromax on discharge to complete 5-day course.     Dementia without behavioral disturbance (HCC) Continue Wellbutrin      GERD (gastroesophageal reflux disease) Continue Protonix     Controlled type 2 diabetes mellitus with hyperglycemia,  with long-term current use of insulin (HCC) Latest hemoglobin A1c of 7.2.  Continue Humulin 70/30 from home.  Diabetic diet.    Hyperlipidemia Continue Lipitor     Essential hypertension Currently not on medication.  Patient is not taking metoprolol listed in MAR.     Chronic diastolic (congestive) heart failure (HCC) No signs of decompensation.    Consultants: None Procedures performed: None Disposition: Home health.  Spoke with the patient's daughter prior to discharge. Diet recommendation:  Discharge Diet Orders (From admission, onward)     Start     Ordered   08/13/22 0000  Diet - low sodium heart healthy        08/13/22 1353   08/13/22 0000  Diet Carb Modified        08/13/22 1353            DISCHARGE MEDICATION: Allergies as of  08/13/2022       Reactions   Lactose Intolerance (gi) Diarrhea   Motrin [ibuprofen] Itching, Anxiety, Other (See Comments)   Anxiousness Hyperventilates         Medication List     STOP taking these medications    escitalopram 20 MG tablet Commonly known  as: LEXAPRO   metoprolol tartrate 25 MG tablet Commonly known as: LOPRESSOR       TAKE these medications    acetaminophen 500 MG tablet Commonly known as: TYLENOL Take 1,000 mg by mouth daily as needed for headache.   AeroChamber MV inhaler Use as instructed   albuterol 108 (90 Base) MCG/ACT inhaler Commonly known as: VENTOLIN HFA INHALE 2 PUFFS BY MOUTH EVERY 6 HOURS AS NEEDED FOR WHEEZING OR SHORTNESS OF BREATH   albuterol (2.5 MG/3ML) 0.083% nebulizer solution Commonly known as: PROVENTIL USE 1 VIAL IN NEBULIZER EVERY 4 HOURS AS NEEDED FOR WHEEZING AND FOR SHORTNESS OF BREATH   aspirin EC 81 MG tablet Take 81 mg by mouth daily.   atorvastatin 40 MG tablet Commonly known as: LIPITOR Take 1 tablet (40 mg total) by mouth daily. What changed: when to take this   azithromycin 500 MG tablet Commonly known as: Zithromax Take 1 tablet (500 mg total) by mouth daily for 3 days. Take 1 tablet daily for 3 days.   budesonide-formoterol 160-4.5 MCG/ACT inhaler Commonly known as: Symbicort Inhale 2 puffs into the lungs 2 (two) times daily.   buPROPion 300 MG 24 hr tablet Commonly known as: Wellbutrin XL Take 1 tablet (300 mg total) by mouth daily.   CALCIUM + VITAMIN D3 PO Take 1-2 tablets by mouth See admin instructions. Take 1 tablet by mouth in the morning and 2 tablets by mouth at bedtime   cefdinir 300 MG capsule Commonly known as: OMNICEF Take 1 capsule (300 mg total) by mouth 2 (two) times daily for 3 days.   cetirizine 10 MG tablet Commonly known as: ZYRTEC Take 10 mg by mouth every evening.   guaiFENesin-dextromethorphan 100-10 MG/5ML syrup Commonly known as: ROBITUSSIN DM Take 5 mLs by mouth every 4 (four) hours as needed for cough (chest congestion).   HumuLIN 70/30 KwikPen (70-30) 100 UNIT/ML KwikPen Generic drug: insulin isophane & regular human KwikPen INJECT 4 UNITS SUBCUTANEOUSLY WITH BREAKFAST AND 2 UNITS WITH SUPPER What changed:  how much to  take how to take this when to take this additional instructions   ibandronate 150 MG tablet Commonly known as: Boniva Take 1 tablet (150 mg total) by mouth every 30 (thirty) days. Take in the morning with a full glass of water, on an empty stomach, and do not take anything else by mouth or lie down for the next 30 min.   MULTIVITAMIN WOMEN PO Take 1 tablet by mouth daily.   pantoprazole 40 MG tablet Commonly known as: PROTONIX Take 1 tablet (40 mg total) by mouth daily.   predniSONE 10 MG tablet Commonly known as: DELTASONE Take 3 tablets (30 mg total) by mouth daily with breakfast for 2 days, THEN 2 tablets (20 mg total) daily with breakfast for 2 days. Start taking on: August 13, 2022 What changed:  medication strength See the new instructions.        Follow-up Information     [facility] Follow up.   Why: [facility] is to follow up with you to provide home health physical therapy               Subjective Today,  patient was seen and examined.  Feels okay.  Denies any nausea vomiting fever chills or rigor.  At baseline.  Discharge Exam: Filed Weights   08/10/22 0924 08/10/22 1634  Weight: 63.8 kg 67.1 kg      08/13/2022    1:42 PM 08/13/2022    5:20 AM 08/12/2022    9:12 PM  Vitals with BMI  Systolic [phone]  Diastolic 54 55 53  Pulse 97 86 93   General:  Average built, not in obvious distress, Communicative, alert awake HENT:   No scleral pallor or icterus noted. Oral mucosa is moist.  Chest:    Diminished breath sounds bilaterally.  CVS: S1 &S2 heard. No murmur.  Regular rate and rhythm. Abdomen: Soft, nontender, nondistended.  Bowel sounds are heard.   Extremities: No cyanosis, clubbing or edema.  Peripheral pulses are palpable. Psych: Alert, awake and Communicative normal mood CNS:  No cranial nerve deficits.  Power equal in all extremities.   Skin: Warm and dry.  No rashes noted.   Condition at discharge: good  The results of  significant diagnostics from this hospitalization (including imaging, microbiology, ancillary and laboratory) are listed below for reference.   Imaging Studies: DG Chest Portable 1 View  Result Date: 08/10/2022 CLINICAL DATA:  Sepsis EXAM: PORTABLE CHEST 1 VIEW COMPARISON:  07/14/2022 FINDINGS: Chronic right suprahilar opacity and volume loss with pleural thickening, site of remote treated lung cancer. Normal heart size and aortic contours. No acute pulmonary opacity. Remote right proximal humerus fracture. No acute osseous finding. IMPRESSION: No acute finding when compared to priors. Electronically Signed   By: Jorje Guild M.D.   On: 08/10/2022 09:45    Microbiology: Results for orders placed or performed during the hospital encounter of 08/10/22  Blood culture (routine x 2)     Status: Abnormal (Preliminary result)   Collection Time: 08/10/22  9:27 AM   Specimen: BLOOD LEFT FOREARM  Result Value Ref Range Status   Specimen Description   Final    BLOOD LEFT FOREARM Performed at [name] [address] [place], [place] [address]    Special Requests   Final    BOTTLES DRAWN AEROBIC AND ANAEROBIC Blood Culture results may not be optimal due to an inadequate volume of blood received in culture bottles Performed at [facility] [address], [place], [place] [address]    Culture  Setup Time   Final    GRAM POSITIVE COCCI IN CLUSTERS AEROBIC BOTTLE ONLY CRITICAL RESULT CALLED TO, READ BACK BY AND VERIFIED WITH: PHARMD Oso [id number] AT 0920 BY EC    Culture (A)  Final    STAPHYLOCOCCUS HOMINIS REPEATING SENSITIVITY Performed at [facility], [place] [address], [place], [place] [address]    Report Status PENDING  Incomplete  Blood Culture ID Panel (Reflexed)     Status: Abnormal   Collection Time: 08/10/22  9:27 AM  Result Value Ref Range Status   Enterococcus faecalis NOT DETECTED NOT DETECTED Final   Enterococcus Faecium  NOT DETECTED NOT DETECTED Final   Listeria monocytogenes NOT DETECTED NOT DETECTED Final   Staphylococcus species DETECTED (A) NOT DETECTED Final    Comment: CRITICAL RESULT CALLED TO, READ BACK BY AND VERIFIED WITH: PHARMD TERRI GREEN [id number] AT 0920 BY EC    Staphylococcus aureus (BCID) NOT DETECTED NOT DETECTED Final   Staphylococcus epidermidis NOT DETECTED NOT DETECTED Final   Staphylococcus lugdunensis NOT DETECTED NOT DETECTED Final  Streptococcus species NOT DETECTED NOT DETECTED Final   Streptococcus agalactiae NOT DETECTED NOT DETECTED Final   Streptococcus pneumoniae NOT DETECTED NOT DETECTED Final   Streptococcus pyogenes NOT DETECTED NOT DETECTED Final   A.calcoaceticus-baumannii NOT DETECTED NOT DETECTED Final   Bacteroides fragilis NOT DETECTED NOT DETECTED Final   Enterobacterales NOT DETECTED NOT DETECTED Final   Enterobacter cloacae complex NOT DETECTED NOT DETECTED Final   Escherichia coli NOT DETECTED NOT DETECTED Final   Klebsiella aerogenes NOT DETECTED NOT DETECTED Final   Klebsiella oxytoca NOT DETECTED NOT DETECTED Final   Klebsiella pneumoniae NOT DETECTED NOT DETECTED Final   Proteus species NOT DETECTED NOT DETECTED Final   Salmonella species NOT DETECTED NOT DETECTED Final   Serratia marcescens NOT DETECTED NOT DETECTED Final   Haemophilus influenzae NOT DETECTED NOT DETECTED Final   Neisseria meningitidis NOT DETECTED NOT DETECTED Final   Pseudomonas aeruginosa NOT DETECTED NOT DETECTED Final   Stenotrophomonas maltophilia NOT DETECTED NOT DETECTED Final   Candida albicans NOT DETECTED NOT DETECTED Final   Candida auris NOT DETECTED NOT DETECTED Final   Candida glabrata NOT DETECTED NOT DETECTED Final   Candida krusei NOT DETECTED NOT DETECTED Final   Candida parapsilosis NOT DETECTED NOT DETECTED Final   Candida tropicalis NOT DETECTED NOT DETECTED Final   Cryptococcus neoformans/gattii NOT DETECTED NOT DETECTED Final    Comment: Performed at [facility], [address] [place], [place] [address]  Blood culture (routine x 2)     Status: Abnormal (Preliminary result)   Collection Time: 08/10/22  9:35 AM   Specimen: BLOOD  Result Value Ref Range Status   Specimen Description   Final    BLOOD LEFT ANTECUBITAL Performed at [facility] [address] [place], [place] [address]    Special Requests   Final    BOTTLES DRAWN AEROBIC ONLY Blood Culture results may not be optimal due to an inadequate volume of blood received in culture bottles Performed at [facility] [address] [place], [place] [address]    Culture  Setup Time   Final    GRAM POSITIVE COCCI IN CLUSTERS AEROBIC BOTTLE ONLY    Culture (A)  Final    STAPHYLOCOCCUS HOMINIS CULTURE REINCUBATED FOR BETTER GROWTH Performed at [facility], [place] [address] [place], [place] [address]    Report Status PENDING  Incomplete  Urine Culture     Status: None   Collection Time: 08/10/22 10:19 AM   Specimen: Urine, Clean Catch  Result Value Ref Range Status   Specimen Description   Final    URINE, CLEAN CATCH Performed at [facility], [place] [address] [place], [place] [address]    Special Requests   Final    NONE Performed at [facility], [place] [address] [place], [place] [address]    Culture   Final    NO GROWTH Performed at [facility], [place] [address], [place],  [address]    Report Status 08/11/2022 FINAL  Final  SARS Coronavirus 2 by RT PCR (hospital order, performed in [facility] hospital lab) *cepheid single result test* Urine, In & Out Cath     Status: None   Collection Time: 08/10/22 10:25 AM   Specimen: Urine, In & Out Cath; Nasal Swab  Result Value Ref Range Status   SARS Coronavirus 2 by RT PCR NEGATIVE NEGATIVE Final    Comment: (NOTE) SARS-CoV-2 target nucleic acids are NOT  DETECTED.  The SARS-CoV-2 RNA is generally detectable in upper and  lower respiratory specimens during the acute phase of infection. The lowest concentration of SARS-CoV-2 viral copies this assay can detect is 250 copies / mL. A negative result does not preclude SARS-CoV-2 infection and should not be used as the sole basis for treatment or other patient management decisions.  A negative result may occur with improper specimen collection / handling, submission of specimen other than nasopharyngeal swab, presence of viral mutation(s) within the areas targeted by this assay, and inadequate number of viral copies (<250 copies / mL). A negative result must be combined with clinical observations, patient history, and epidemiological information.  Fact Sheet for Patients:   https://www.patel.info/  Fact Sheet for Healthcare Providers: https://hall.com/  This test is not yet approved or  cleared by the [place] FDA and has been authorized for detection and/or diagnosis of SARS-CoV-2 by FDA under an Emergency Use Authorization (EUA).  This EUA will remain in effect (meaning this test can be used) for the duration of the COVID-19 declaration under Section 564(b)(1) of the Act, 21 U.S.C. section 360bbb-3(b)(1), unless the authorization is terminated or revoked sooner.  Performed at [facility], [place] [address], [place], [place] [address]   Culture, blood (Routine X 2) w Reflex to ID Panel     Status: None (Preliminary result)   Collection Time: 08/11/22  1:44 PM   Specimen: BLOOD  Result Value Ref Range Status   Specimen Description   Final    BLOOD SITE NOT SPECIFIED Performed at [facility] [address] [place], [place] [address]    Special Requests   Final    BOTTLES DRAWN AEROBIC ONLY Blood Culture adequate volume Performed at [facility] [address] [place], [place] [address]    Culture   Final    NO GROWTH 2 DAYS Performed at [facility] [address], [place], [place] [address]    Report Status PENDING  Incomplete  Culture, blood (Routine X 2) w Reflex to ID Panel     Status: None (Preliminary result)   Collection Time: 08/11/22  1:44 PM   Specimen: BLOOD  Result Value Ref Range Status   Specimen Description   Final    BLOOD SITE NOT SPECIFIED Performed at [facility] [address] [place], [place] [address]    Special Requests   Final    BOTTLES DRAWN AEROBIC ONLY Blood Culture adequate volume Performed at [facility] [address] [place], [place] [address]    Culture   Final    NO GROWTH 2 DAYS Performed at [facility] [address] [place],  [address]    Report Status PENDING  Incomplete    Labs: CBC: Recent Labs  Lab 08/10/22 0922 08/11/22 0521 08/13/22 0623  WBC 15.5* 11.6* 8.9  NEUTROABS 12.3* 10.2*  --   HGB 12.1 11.2* 11.3*  HCT 38.6 35.3* 35.7*  MCV 90.8 90.1 89.3  PLT 185 169 944   Basic Metabolic Panel: Recent Labs  Lab 08/10/22 0922 08/11/22 0521 08/13/22 0623  NA 141 137 139  K 3.9 4.0 4.2  CL 108 109 105  CO2 25 21* 28  GLUCOSE 147* 233* 116*  BUN 22 23 25*  CREATININE 1.19* 0.90 0.97  CALCIUM 8.5* 8.3* 9.6  MG  --  2.4 2.3   Liver Function Tests: Recent Labs  Lab 08/10/22 9675 08/11/22  0521  AST 19 13*  ALT 13 13  ALKPHOS 75 64  BILITOT 0.7 0.5  PROT 6.4* 5.9*  ALBUMIN 3.5 3.0*   CBG: Recent Labs  Lab 08/12/22 1119 08/12/22 1617 08/12/22 2116 08/13/22 0744 08/13/22 1204  GLUCAP 133* 283* 191* 96 148*    Discharge time spent: greater than 30 minutes.  Signed: Flora Lipps, MD [facility] 08/13/2022

## 2022-08-13 NOTE — TOC Transition Note (Signed)
Transition of Care [facility]) - CM/SW Discharge Note   Patient Details  Name: [name] MRN: [id number] Date of Birth: 1946-09-10  [facility] CM/SW Contact:  Ross Ludwig, [name] Phone Number: 08/13/2022, 2:14 PM   Clinical Narrative:     Patient will be going home with home health through [facility] signing off please reconsult with any other TOC needs, home health agency has been notified of planned discharge.   Final next level of care: [place] Barriers to Discharge: No Barriers Identified   Patient Goals and CMS Choice Patient states their goals for this hospitalization and ongoing recovery are:: To go home CMS Medicare.gov Compare Post Acute Care list provided to:: Patient Choice offered to / list presented to : Patient, Adult Children  Discharge Placement                       Discharge Plan and Services In-house Referral: NA Discharge Planning Services: NA Post Acute Care Choice: Home Health          DME Arranged: N/A DME Agency: NA       HH Arranged: PT HH Agency: [facility] Date [place]: 08/11/22 Time [place]: [id number] Representative spoke with at [place]: [place] ([place]) Interventions     Readmission Risk Interventions    08/11/2022    3:13 PM  Readmission Risk Prevention Plan  Transportation Screening Complete  PCP or Specialist Appt within 5-7 Days Complete  Home Care Screening Complete  Medication Review (RN CM) Complete

## 2022-08-15 ENCOUNTER — Telehealth: Payer: Self-pay

## 2022-08-15 NOTE — Telephone Encounter (Signed)
Transition Care Management Follow-up Telephone Call Date of discharge and from where: [facility] 08/13/22 How have you been since you were released from the hospital? Weak and SOB at times  Any questions or concerns? No  Items Reviewed: Did the pt receive and understand the discharge instructions provided? Yes  Medications obtained and verified? Yes  Other? No  Any new allergies since your discharge? No  Dietary orders reviewed? Yes Do you have support at home? Yes   Home Care and Equipment/Supplies: Were home health services ordered? yes If so, what is the name of the agency? [facility]   Has the agency set up a time to come to the patient's home? no Were any new equipment or medical supplies ordered?  No What is the name of the medical supply agency?  Were you able to get the supplies/equipment? not applicable Do you have any questions related to the use of the equipment or supplies? No  Functional Questionnaire: (I = Independent and D = Dependent) ADLs: D  Bathing/Dressing- D  Meal Prep- D  Eating- D  Maintaining continence- D  Transferring/Ambulation- D  Managing Meds- D Daughter assist   Follow up appointments reviewed:  PCP Hospital f/u appt confirmed? Yes  Scheduled to see Dr Anitra Lauth  on 08/18/22 @ 10:00. [facility] f/u appt confirmed? No   Are transportation arrangements needed? No  If their condition worsens, is the pt aware to call PCP or go to the Emergency Dept.? Yes Was the patient provided with contact information for the PCP's office or ED? Yes Was to pt encouraged to call back with questions or concerns? Yes  Charlott Rakes, LPN [facility]  Direct Dial [phone]

## 2022-08-15 NOTE — Telephone Encounter (Signed)
Noted: nurse phone contact with patient for TCM. Signed:  Crissie Sickles, MD           08/15/2022

## 2022-08-16 ENCOUNTER — Telehealth: Payer: Self-pay | Admitting: Family Medicine

## 2022-08-16 DIAGNOSIS — J209 Acute bronchitis, unspecified: Secondary | ICD-10-CM | POA: Diagnosis not present

## 2022-08-16 DIAGNOSIS — J441 Chronic obstructive pulmonary disease with (acute) exacerbation: Secondary | ICD-10-CM | POA: Diagnosis not present

## 2022-08-16 DIAGNOSIS — E1122 Type 2 diabetes mellitus with diabetic chronic kidney disease: Secondary | ICD-10-CM | POA: Diagnosis not present

## 2022-08-16 DIAGNOSIS — K219 Gastro-esophageal reflux disease without esophagitis: Secondary | ICD-10-CM | POA: Diagnosis not present

## 2022-08-16 DIAGNOSIS — I5032 Chronic diastolic (congestive) heart failure: Secondary | ICD-10-CM | POA: Diagnosis not present

## 2022-08-16 DIAGNOSIS — R6510 Systemic inflammatory response syndrome (SIRS) of non-infectious origin without acute organ dysfunction: Secondary | ICD-10-CM | POA: Diagnosis not present

## 2022-08-16 LAB — CULTURE, BLOOD (ROUTINE X 2)
Culture: NO GROWTH
Culture: NO GROWTH
Special Requests: ADEQUATE
Special Requests: ADEQUATE

## 2022-08-16 NOTE — Telephone Encounter (Signed)
Okay for orders? 

## 2022-08-16 NOTE — Telephone Encounter (Signed)
Caller/Agency: [facility] Callback Number:Unsure. Said he would fax as well Requesting OT/PT/Skilled Nursing/Social PT Work/Speech Therapy: Frequency: 1 week 3

## 2022-08-17 NOTE — Telephone Encounter (Signed)
Yes, okay.

## 2022-08-17 NOTE — Telephone Encounter (Signed)
Called [facility] and verbal orders given.

## 2022-08-18 ENCOUNTER — Ambulatory Visit (INDEPENDENT_AMBULATORY_CARE_PROVIDER_SITE_OTHER): Payer: Medicare Other | Admitting: Family Medicine

## 2022-08-18 ENCOUNTER — Encounter: Payer: Self-pay | Admitting: Family Medicine

## 2022-08-18 VITALS — BP 104/66 | HR 102 | Temp 97.5°F | Ht 60.0 in | Wt 140.2 lb

## 2022-08-18 DIAGNOSIS — R6510 Systemic inflammatory response syndrome (SIRS) of non-infectious origin without acute organ dysfunction: Secondary | ICD-10-CM | POA: Diagnosis not present

## 2022-08-18 DIAGNOSIS — J441 Chronic obstructive pulmonary disease with (acute) exacerbation: Secondary | ICD-10-CM | POA: Diagnosis not present

## 2022-08-18 DIAGNOSIS — F330 Major depressive disorder, recurrent, mild: Secondary | ICD-10-CM

## 2022-08-18 NOTE — Progress Notes (Signed)
08/18/2022  CC:  Chief Complaint  Patient presents with   Hospitalization Follow-up    Patient is a 76 y.o. Caucasian female who presents accompanied by her daughter for hospital follow up, specifically [facility] face-to-face visit. Dates hospitalized: 10/18 to 10/21, 2023 Days since d/c from hospital: 5 days Patient was discharged from hospital to home. Reason for admission to hospital: Generalized weakness, dx'd with SIRS and copd exac. Date of interactive (phone) contact with patient and/or caregiver: 08/15/2022.  I have reviewed patient's discharge summary plus pertinent specific notes, labs, and imaging from the hospitalization.   Initial evaluation revealed mild leukocytosis, lactate 2.1, and a mild bump in creatinine (GFR 45).  CXR neg acute.  She was treated with Rocephin and Zithromax and clinically improved.  Lactate improved to 1.5.  White blood cells normalized.  Discharged home on Omnicef and Zithromax and prednisone 66m qd x 2d then 224mqd x 2d. Of note, 2 out of 4 blood culture bottles on 08/10/2022--the date of admission--were positive for staph hominis.  Blood cultures on 08/11/2022 showed no growth x5 days.  CURRENTLY: Feeling tired but otherwise much better.  Has a bit of rattly cough but no wheezing, shortness of breath, chest pain, or fever.  She has not required albuterol in several days. Eating and drinking well. Finished antibiotics and steroids. She is taking Symbicort 2 puffs twice a day that we started at our last check in visit 2 weeks ago. Also, no problems with the increased dose of Wellbutrin that we did last time.  No change in mood yet.  Medication reconciliation was done today and patient is taking meds as recommended by discharging hospitalist/specialist.    PMH:  Past Medical History:  Diagnosis Date   Acute renal failure ([facility]   Age-related nuclear cataract of both eyes 2016   +cortical age related cataracts OU    Anxiety  and depression    Bilateral diabetic retinopathy ([facility]   Dr. 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   Colonic diverticular abscess    COPD (chronic obstructive pulmonary disease) (HCC)    CVA (cerebral vascular accident) ([facility]CS[facility]12/2017   MRI did show a left-sided small ischemic stroke which was acute but likely incidental (MRI was done b/c pt had TIA sx's in hospital).  Neuro put her on plavix at that time.  Cardiology d/c'd her plavix 08/2017.   Diabetes mellitus with complication (HCC)    diab retinopathy OU (laser)   Diverticulitis of large intestine with perforation and abscess 09/11/2016   Finger fracture, right 04/2020   R 5th metacarpal fx at the base (Dr. LaMardelle Matte  GERD (gastroesophageal reflux disease)    protonix   History of concussion 08/25/2018   w/out loss of consciousness--mild increase in baseline memory impairment after.  CT head neg acute.   History of diverticulitis of colon    with abscess; required IR percutaneous drain placed 09/2016.   History of iron deficiency anemia    started iron 07/02/19.  EGD/colonoscopy 08/27/19 unrevealing. Capsule endoscopy considered but never done.   Hyperlipidemia    Hypertension    Left bundle branch block (LBBB) 12/16/2016   Lung cancer ([facility]   non-small cell lung ca, stage III in 05/2011; systemic chemotherapy concurrent with radiation followed by prophylactic  cranial irradiation and has been observation since July of 2010 with no evidence for disease recurrence-released from onc f/u 12/2014 (needs annual cxr by PCP).  CXR 08/2015 stable.  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 Earlie Server did bx and this was NEG for malignancy.   Mild cognitive impairment with memory loss    Likely from brain radiation therapy   Non-obstructive CAD (coronary artery  disease)    a. Cath 2006 preserved LV fxn, scattered irregularities without critical stenosis; b. 2008 stress echo negative for ischemia, but with hypertensive response; c. 08/2016 Cath: D1 25%, otw nl.   Normocytic anemia 2018   Iron, vit B12, folate all normal 12/2016.   Open-angle glaucoma 2016   Dr. Shirley Muscat, (bilateral)---responding to topical therapy   Osteoarthritis of both hands    Osteoporosis 03/2016; 03/2018   03/2016 DEXA T-score -2.2.  03/2018 T-score L femoral neck  -2.7--boniva started. DEXA 04/2021 T scor -2.6->boniva continued. Plan rpt DEXA 2 yrs.   Pneumonia    hospitalization 11/2016; hypoxemic resp failure--d/c'd home with home oxygen therapy   Pulmonary nodule, left 08/2021   LUL 11 mm ground glass, needs f/u CT 08/2022.   Seasonal allergic rhinitis    Subclinical hyperthyroidism 2018   Takotsubo cardiomyopathy    a. 08/2016 Echo: EF 30-35%, gr1DD, PASP 10mHg;  b. 08/2016 Cath: nonobs Dzs;  c. 11/2016 Echo: EF 40-45%, no rwma, nl RV fxn, mild TR, PASP 331mg.  ECHO 02/2017 EF 50-55%, grd II DD---essentially resolved Takotsubo 02/2017.   Torsades de pointes (HCD[facility]11/2017   a. 08/2016 in setting of diverticulitis & pneumoperitoneum and Takotsubo CM -->prolonged QT, seen by EP-->avoid meds with potential for QT prolongation.    PSH:  Past Surgical History:  Procedure Laterality Date   ABDOMINAL HYSTERECTOMY  1997   APPENDECTOMY     CARDIAC CATHETERIZATION N/A 09/14/2016   Procedure: Left Heart Cath and Coronary Angiography;  Surgeon: ThTroy SineMD;  [facility];  Service: Cardiovascular; Mild nonobstructive CAD with 85% smooth narrowing in the first diagonal branch of the LAD; 10% smooth narrowing of the ostial proximal left circumflex coronary artery; and a normal dominant RCA.   CARPAL TUNNEL RELEASE     CATARACT EXTRACTION Bilateral    CHOLECYSTECTOMY  2002   COLONOSCOPY  10/24/00; 08/27/19   2002 normal.  [employer] hemoccult testing via [facility] 06/18/15  was NEG.  2020->diverticulosis o/w nl.   dexa  03/2016; 03/2018   03/2018 T-score L femoral neck  -2.7 (worsened compared to osteopenic range 2017.) DEXA 05/03/21 stable T score -2.6 on boniva x 2 yrs->plan rpt DEXA 2 yrs.   ESOPHAGOGASTRODUODENOSCOPY  08/27/2019   Normal.   IR GENERIC HISTORICAL  09/19/2016   IR SINUS/FIST TUBE CHK-NON GI 09/19/2016 JaCorrie MckusickDO [facility]   IR GENERIC HISTORICAL  10/06/2016   IR RADIOLOGIST EVAL & MGMT 10/06/2016 [facility]   IR GENERIC HISTORICAL  10/26/2016   IR RADIOLOGIST EVAL & MGMT 10/26/2016 [facility]   IR GENERIC HISTORICAL  11/03/2016   IR RADIOLOGIST EVAL & MGMT 11/03/2016 WeArdis RowanPA-C [facility]   IR GENERIC HISTORICAL  11/24/2016   IR RADIOLOGIST EVAL & MGMT 11/24/2016 WeArdis RowanPA-C [facility]   IR GENERIC HISTORICAL  12/05/2016   Fistula smaller/improving.  IR SINUS/FIST TUBE CHK-NON GI 12/05/2016 JoSandi MariscalMD [facility]   IR GENERIC HISTORICAL  12/22/2016   IR RADIOLOGIST EVAL & MGMT 12/22/2016 MiGreggory KeenMD  [facility]   IR RADIOLOGIST EVAL & MGMT  01/10/2017   IR RADIOLOGIST EVAL & MGMT  02/09/2017   IR SINUS/FIST TUBE CHK-NON GI  03/28/2017   LEFT HEART CATHETERIZATION WITH CORONARY ANGIOGRAM N/A 05/30/2012   Procedure: LEFT HEART CATHETERIZATION WITH CORONARY ANGIOGRAM;  Surgeon: Burnell Blanks, MD;  Location: [facility] C[facility] LAB;  Service: Cardiovascular: Mild, non-obstructive CAD   TRANSTHORACIC ECHOCARDIOGRAM  09/14/2016; 11/2016   08/2016: EF 30-35 %, Akinesis of the mid-apicalanteroseptal myocardium.  Grade I DD.  Mild pulm HTN.  Repeat echo 11/2016, EF 40-45%, no rwma, nl RV fxn, mild TR, PASP 51mHg.     TRANSTHORACIC ECHOCARDIOGRAM  03/07/2017   EF 50-55%. Hypokinesis of the distal septum with overall low normal LV,  systolic function; mild diastolic dysfunction with elevated LV  filling pressure; mildly calcified aortic valve with mild AI; small pericardial effusion     MEDS:  Outpatient Medications Prior to Visit  Medication Sig Dispense Refill   acetaminophen (TYLENOL) 500 MG tablet Take 1,000 mg by mouth daily as needed for headache.     aspirin 81 MG EC tablet Take 81 mg by mouth daily.      atorvastatin (LIPITOR) 40 MG tablet Take 1 tablet (40 mg total) by mouth daily. (Patient taking differently: Take 40 mg by mouth at bedtime.) 90 tablet 1   budesonide-formoterol (SYMBICORT) 160-4.5 MCG/ACT inhaler Inhale 2 puffs into the lungs 2 (two) times daily. 1 each 6   buPROPion (WELLBUTRIN XL) 300 MG 24 hr tablet Take 1 tablet (300 mg total) by mouth daily. 30 tablet 0   Calcium Carb-Cholecalciferol (CALCIUM + VITAMIN D3 PO) Take 1-2 tablets by mouth See admin instructions. Take 1 tablet by mouth in the morning and 2 tablets by mouth at bedtime     cetirizine (ZYRTEC) 10 MG tablet Take 10 mg by mouth every evening.     guaiFENesin-dextromethorphan (ROBITUSSIN DM) 100-10 MG/5ML syrup Take 5 mLs by mouth every 4 (four) hours as needed for cough (chest congestion). 118 mL 0   ibandronate (BONIVA) 150 MG tablet Take 1 tablet (150 mg total) by mouth every 30 (thirty) days. Take in the morning with a full glass of water, on an empty stomach, and do not take anything else by mouth or lie down for the next 30 min. 3 tablet 3   insulin isophane & regular human KwikPen (HUMULIN 70/30 KWIKPEN) (70-30) 100 UNIT/ML KwikPen INJECT 4 UNITS SUBCUTANEOUSLY WITH BREAKFAST AND 2 UNITS WITH SUPPER (Patient taking differently: Inject 4 Units into the skin 2 (two) times daily.) 15 mL 1   Multiple Vitamins-Minerals (MULTIVITAMIN WOMEN PO) Take 1 tablet by mouth daily.     pantoprazole (PROTONIX) 40 MG tablet Take 1 tablet (40 mg total) by mouth daily. 90 tablet 1   Spacer/Aero-Holding Chambers (AEROCHAMBER MV) inhaler Use as instructed 1 each 2   albuterol (PROVENTIL) (2.5 MG/3ML) 0.083% nebulizer solution USE 1 VIAL IN NEBULIZER EVERY 4 HOURS AS NEEDED FOR WHEEZING AND FOR SHORTNESS  OF BREATH (Patient not taking: Reported on 08/10/2022) 90 mL 1   albuterol (VENTOLIN HFA) 108 (90 Base) MCG/ACT inhaler INHALE 2 PUFFS BY MOUTH EVERY 6 HOURS AS NEEDED FOR WHEEZING OR SHORTNESS OF BREATH (Patient not taking: Reported on 08/10/2022) 18 g 1   No facility-administered medications prior to visit.    Physical Exam    08/18/2022    9:52 AM 08/13/2022    1:42 PM 08/13/2022    5:20 AM  Vitals with BMI  Height 5' 0"    Weight 140 lbs 3 oz    BMI 62.83    Systolic 151 761 607  Diastolic 66 54 55  Pulse 371 97 86   Gen: Alert, well appearing.  Patient is oriented to person, place, time, and situation. AFFECT: pleasant, lucid thought and speech. CV: regular, tachy 100-110, no m/r Chest is clear, no wheezing or rales. Normal symmetric air entry throughout both lung fields. No chest wall deformities or tenderness. EXT: no clubbing or cyanosis.  no edema.   Pertinent labs/imaging Last CBC Lab Results  Component Value Date   WBC 8.9 08/13/2022   HGB 11.3 (L) 08/13/2022   HCT 35.7 (L) 08/13/2022   MCV 89.3 08/13/2022   MCH 28.3 08/13/2022   RDW 15.1 08/13/2022   PLT 208 04/18/9484   Last metabolic panel Lab Results  Component Value Date   GLUCOSE 116 (H) 08/13/2022   NA 139 08/13/2022   K 4.2 08/13/2022   CL 105 08/13/2022   CO2 28 08/13/2022   BUN 25 (H) 08/13/2022   CREATININE 0.97 08/13/2022   GFRNONAA >60 08/13/2022   CALCIUM 9.6 08/13/2022   PHOS 2.9 05/22/2022   PROT 5.9 (L) 08/11/2022   ALBUMIN 3.0 (L) 08/11/2022   LABGLOB 2.7 09/19/2018   AGRATIO 1.6 09/19/2018   BILITOT 0.5 08/11/2022   ALKPHOS 64 08/11/2022   AST 13 (L) 08/11/2022   ALT 13 08/11/2022   ANIONGAP 6 08/13/2022   ASSESSMENT/PLAN:  #1 SIRS, with COPD exacerbation. Staph hominis grew into blood cultures.  Follow-up blood cultures negative.  She has had appropriate antibiotics and systemic steroids and is improving significantly/appropriately. She is slightly tachycardic today.  Has  not had albuterol today but did take 2 puffs of her Symbicort. We will decrease Symbicort to 1 puff twice daily.  #2 major depressive disorder, mild, recurrent. This is in the setting of cognitive impairment, mild dementia. Tolerating recent increase in Wellbutrin to 300 mg/day, no improvement yet.  No labs indicated today.  Medical decision making of moderate complexity was utilized today.  FOLLOW UP:  3 mo  Signed:  Crissie Sickles, MD           08/18/2022

## 2022-08-19 LAB — SUSCEPTIBILITY RESULT

## 2022-08-19 LAB — CULTURE, BLOOD (ROUTINE X 2)

## 2022-08-19 LAB — SUSCEPTIBILITY, AER + ANAEROB

## 2022-08-22 ENCOUNTER — Telehealth: Payer: Self-pay | Admitting: Family Medicine

## 2022-08-22 DIAGNOSIS — R6510 Systemic inflammatory response syndrome (SIRS) of non-infectious origin without acute organ dysfunction: Secondary | ICD-10-CM | POA: Diagnosis not present

## 2022-08-22 DIAGNOSIS — I5032 Chronic diastolic (congestive) heart failure: Secondary | ICD-10-CM | POA: Diagnosis not present

## 2022-08-22 DIAGNOSIS — J209 Acute bronchitis, unspecified: Secondary | ICD-10-CM | POA: Diagnosis not present

## 2022-08-22 DIAGNOSIS — J441 Chronic obstructive pulmonary disease with (acute) exacerbation: Secondary | ICD-10-CM | POA: Diagnosis not present

## 2022-08-22 DIAGNOSIS — E1122 Type 2 diabetes mellitus with diabetic chronic kidney disease: Secondary | ICD-10-CM | POA: Diagnosis not present

## 2022-08-22 DIAGNOSIS — K219 Gastro-esophageal reflux disease without esophagitis: Secondary | ICD-10-CM | POA: Diagnosis not present

## 2022-08-22 IMAGING — MR MR HEAD W/O CM
2 series · 48 of 48 positions shown · non-contrast
Comparison: 09/25/2016

CLINICAL DATA: Neuro deficit, stroke suspected

EXAM:
MRI HEAD WITHOUT CONTRAST
TECHNIQUE: Multiplanar, multiecho pulse sequences of the brain and surrounding
structures were obtained without intravenous contrast.

[Series 11: T1 · sagittal · 5.0mm · 0.75mm/px · 16 of 26 slices shown]
[im 1/26]
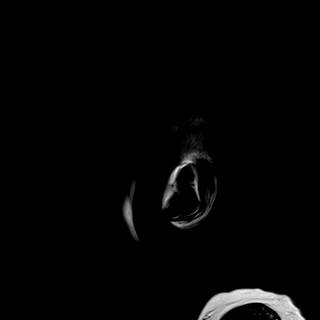
[im 2/26]
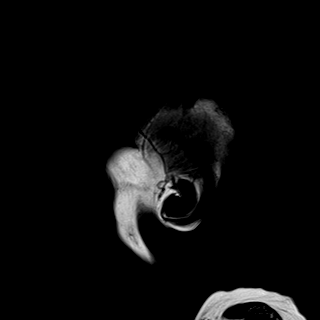
[im 4/26]
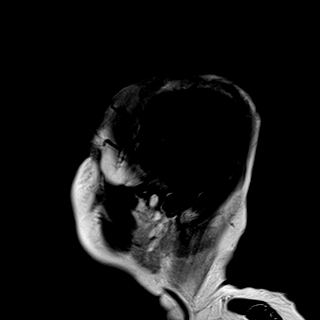
[im 6/26]
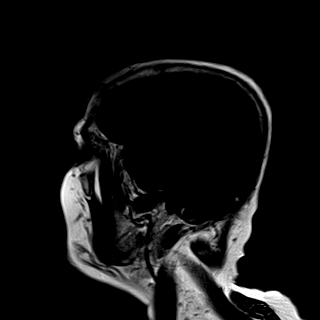
[im 7/26]
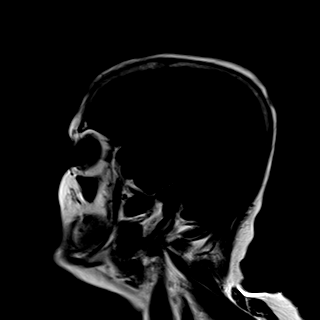
[im 9/26]
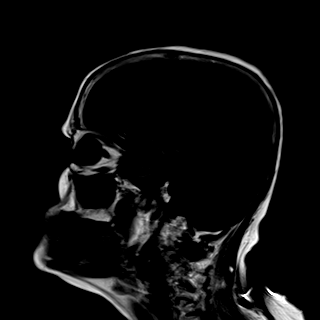
[im 11/26]
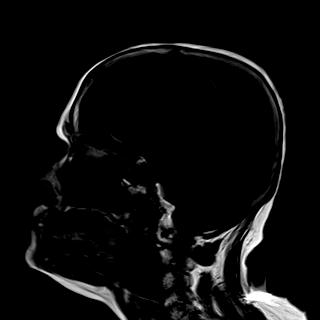
[im 12/26]
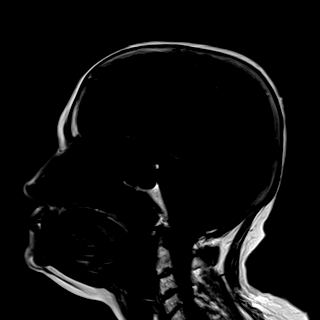
[im 14/26]
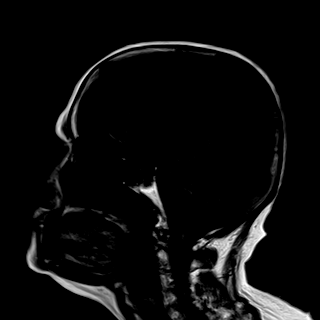
[im 16/26]
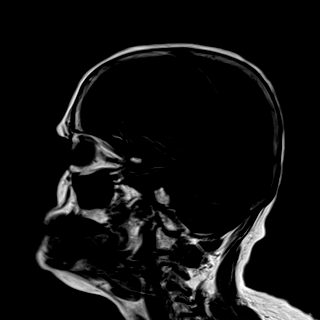
[im 17/26]
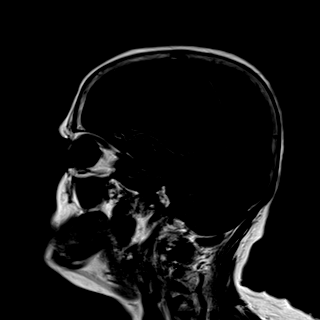
[im 19/26]
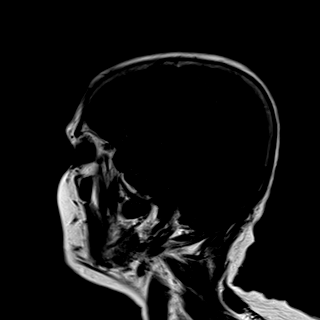
[im 21/26]
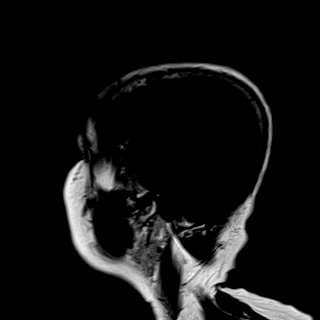
[im 22/26]
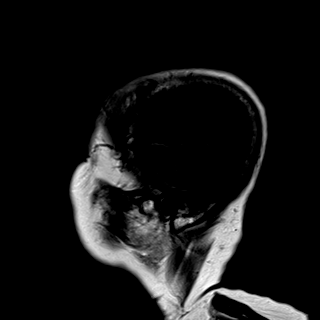
[im 24/26]
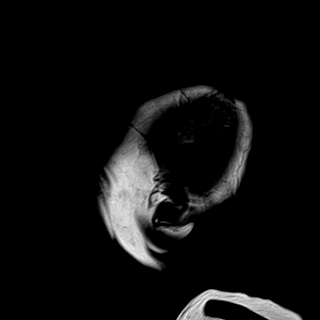
[im 26/26]
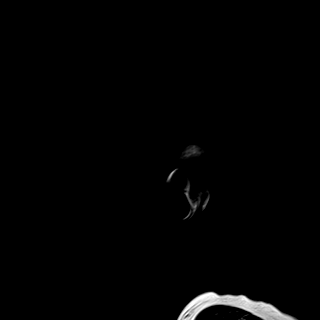

[Series 13: swi_images · axial · 3.0mm · 0.75mm/px · z∈[-129,+19]mm · 32 of 52 slices shown]
[im 1/52]
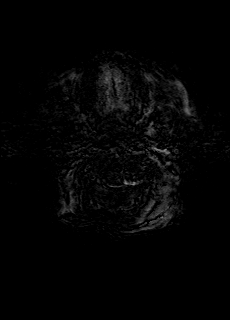
[im 2/52]
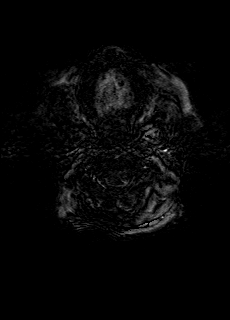
[im 4/52]
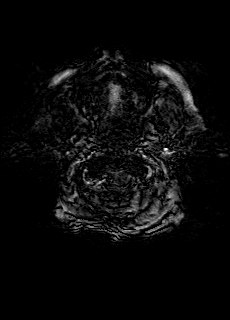
[im 5/52]
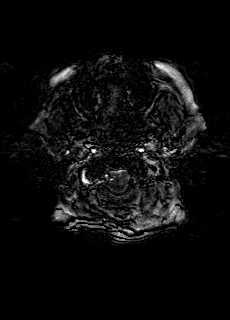
[im 7/52]
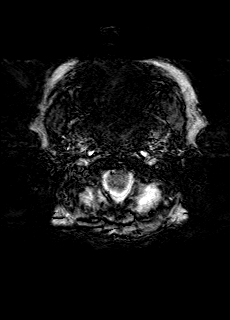
[im 9/52]
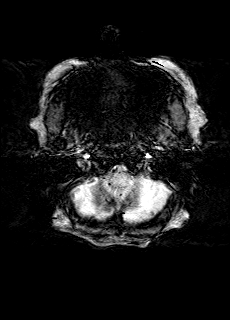
[im 10/52]
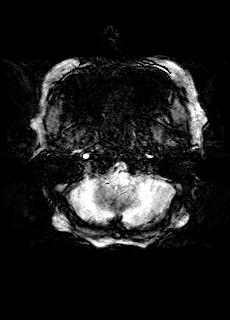
[im 12/52]
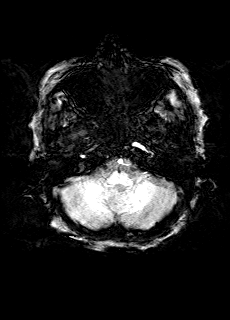
[im 14/52]
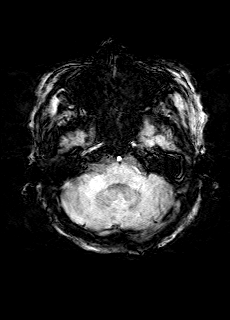
[im 15/52]
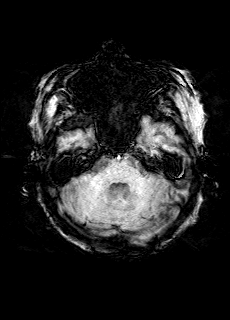
[im 17/52]
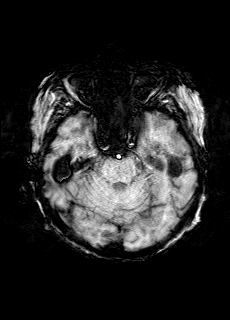
[im 19/52]
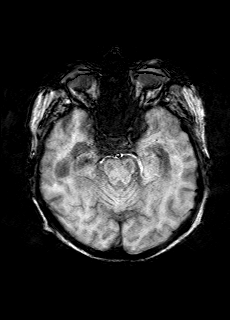
[im 20/52]
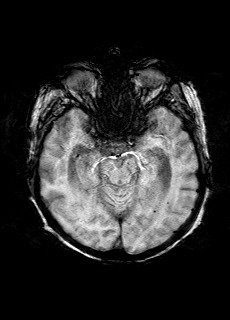
[im 22/52]
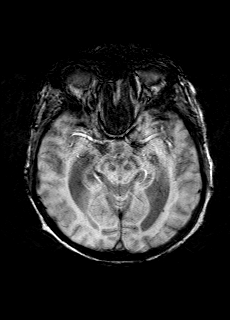
[im 24/52]
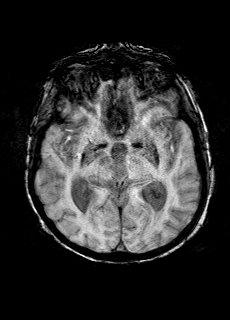
[im 25/52]
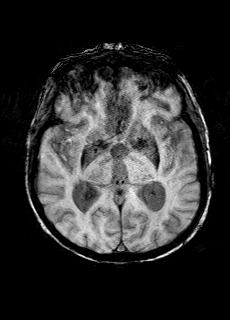
[im 27/52]
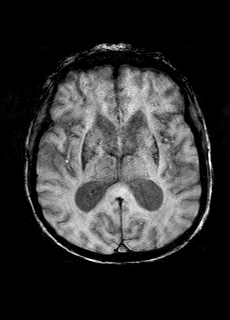
[im 28/52]
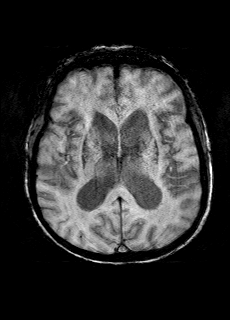
[im 30/52]
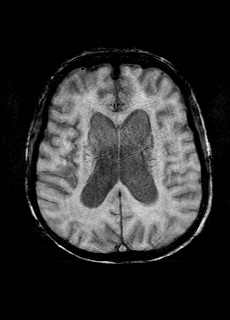
[im 32/52]
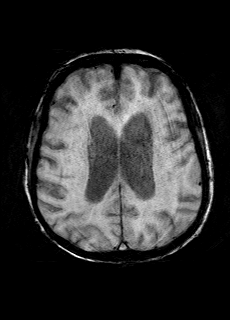
[im 33/52]
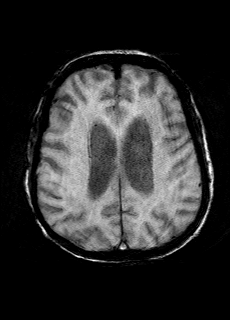
[im 35/52]
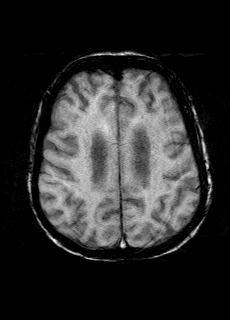
[im 37/52]
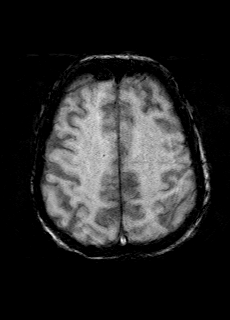
[im 38/52]
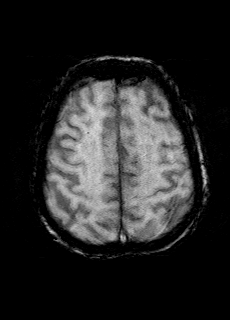
[im 40/52]
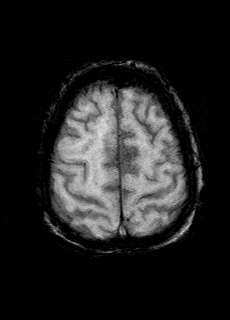
[im 42/52]
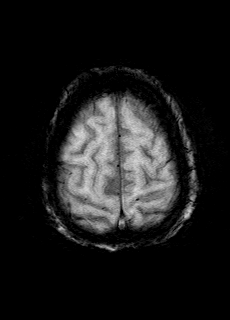
[im 43/52]
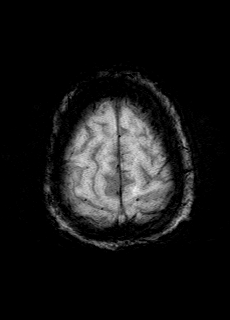
[im 45/52]
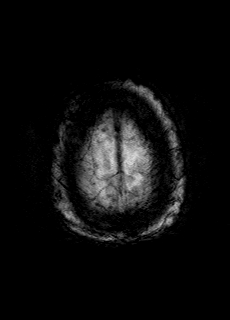
[im 47/52]
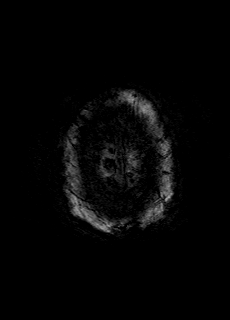
[im 48/52]
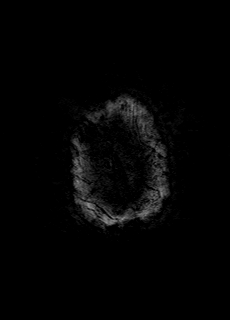
[im 50/52]
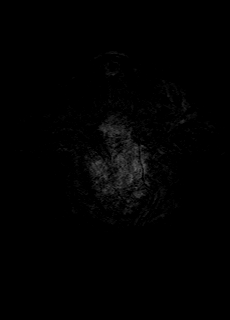
[im 52/52]
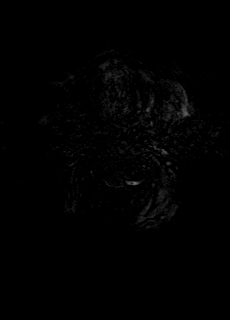

[48 of 48 positions shown; findings below may reference images not displayed]

FINDINGS: Evaluation is somewhat limited by motion artifact.

Brain: No restricted diffusion to suggest acute or subacute infarct.
No acute hemorrhage, mass, mass effect, or midline shift. Scattered
punctate foci of hemosiderin deposition in the bilateral cerebral
hemispheres. Diffuse parenchymal atrophy, advanced for age.
Ventricles and sulci appear commensurate with degree atrophy.
Confluent T2 hyperintense signal in the periventricular white
matter, likely the sequela of severe chronic small vessel ischemic
disease. Definite hydrocephalus or extra-axial collection.

Vascular: Apparent loss of the left vertebral artery flow void,
which was present on the 9199 exam. Otherwise normal flow voids.

Skull and upper cervical spine: Normal marrow signal.

Sinuses/Orbits: Negative.  Status post bilateral lens replacements.

Other: None.
IMPRESSION: 1. Apparent loss of the left vertebral artery flow void; this flow
void was present on the 9199 exam. This may represent progression of
previously noted stenosis in the left vertebral artery, as seen on
the 09/26/2016 CTA head neck versus occlusion of indeterminate
acuity.
2. No evidence of acute or subacute infarct.
3. Punctate foci of hemosiderin deposition in the bilateral cerebral
hemispheres, which is nonspecific but can be seen in the setting of
cerebral amyloid angiopathy.

## 2022-08-22 IMAGING — CR DG CHEST 2V
2 series · 2 of 2 positions shown · non-contrast
Comparison: July 20, 2021 and September 08, 2021.

CLINICAL DATA: Cough weakness.

EXAM:
CHEST - 2 VIEW

[w chest lat]
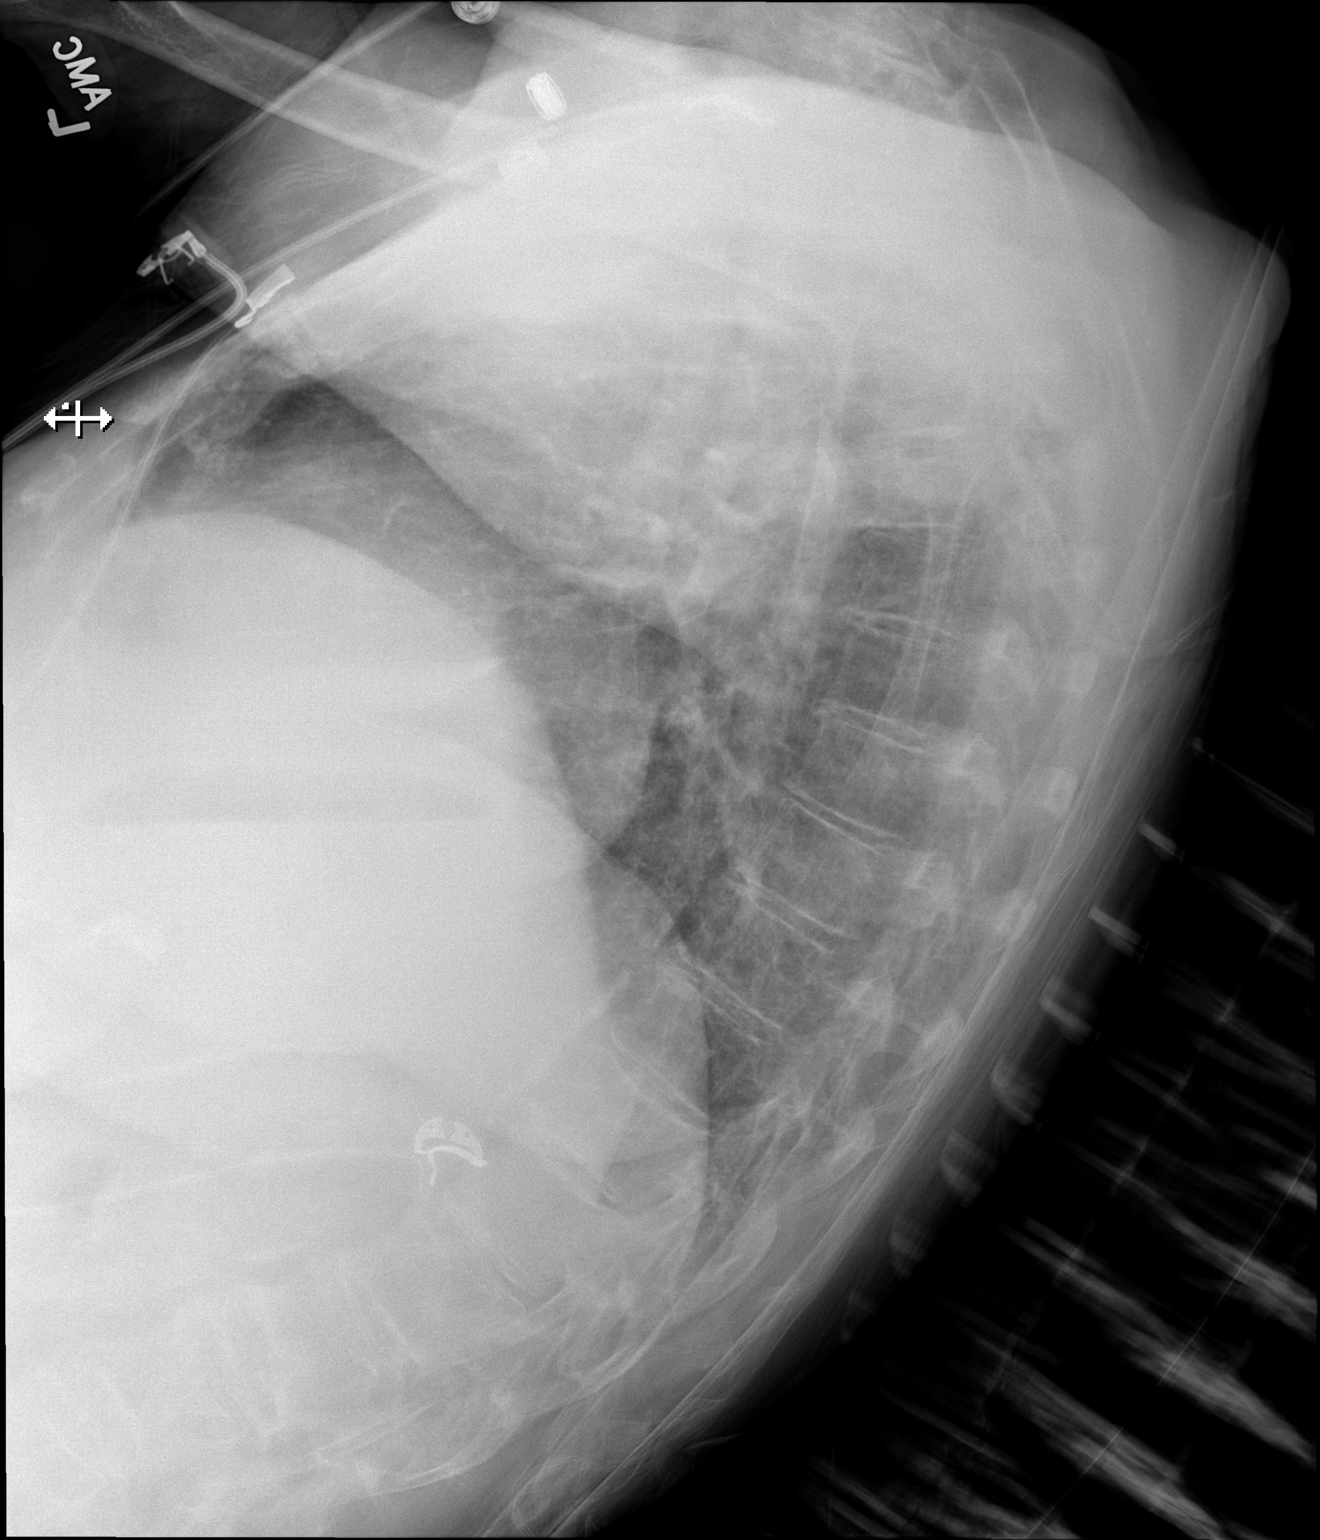

[x chest ap]
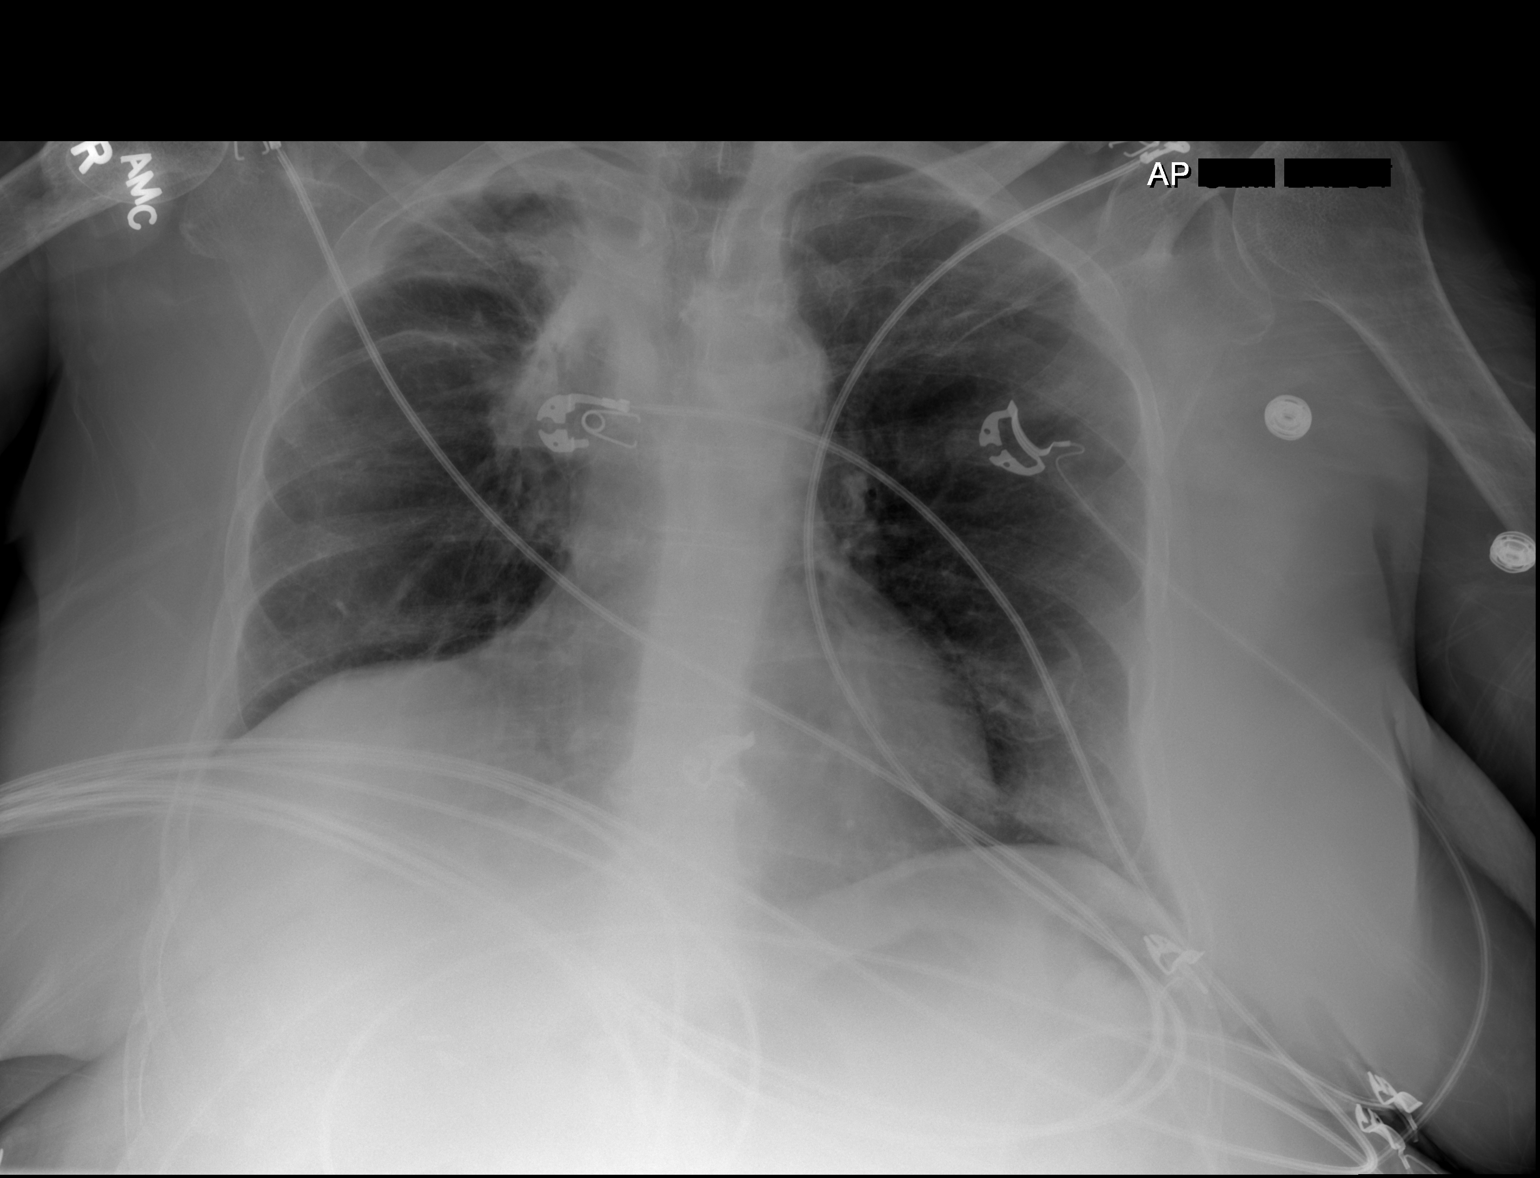

[2 of 2 positions shown; findings below may reference images not displayed]

FINDINGS: Paramediastinal radiotherapy changes in the RIGHT upper lobe with
resultant volume loss/consolidation similar to previous imaging,
scaring the upper margin of the RIGHT mediastinal border.

Nodular density at the LEFT lung apex seen on previous radiograph is
no longer seen.

Cardiomediastinal contours and hilar structures are unchanged, mild
cardiac enlargement and some elevation of the RIGHT hemidiaphragm as
before. No sign of pleural effusion.

EKG leads project over the patient's chest.

On limited assessment there is no acute skeletal process.
IMPRESSION: Paramediastinal radiotherapy changes in the RIGHT upper lobe with
resultant volume loss/consolidation similar to previous imaging.

No acute cardiopulmonary disease.

## 2022-08-22 IMAGING — CT CT HEAD W/O CM
3 series · 16 of 47 positions shown, 19 images · non-contrast
Comparison: 05/28/2021 and previous

CLINICAL DATA: Weakness

EXAM:
CT HEAD WITHOUT CONTRAST
TECHNIQUE: Contiguous axial images were obtained from the base of the skull
through the vertex without intravenous contrast.

[Series 2: head wo · axial · 0.47mm/px · z∈[-93,+32]mm · 10 of 31 slices shown, 13 images]
[im 3/31  brain]
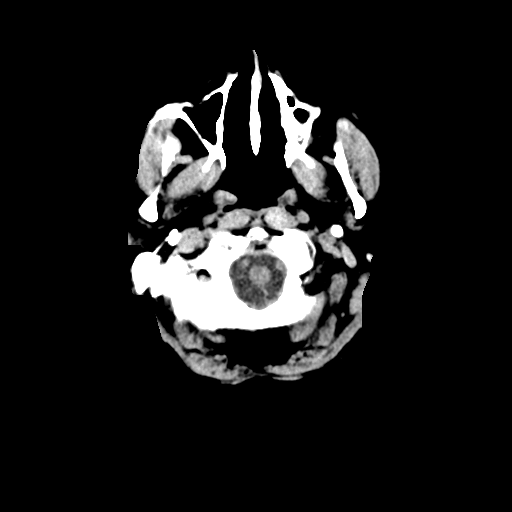
[im 3/31  bone]
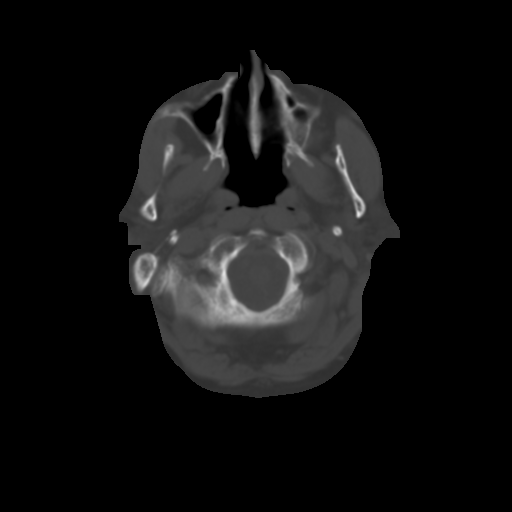
[im 6/31  brain]
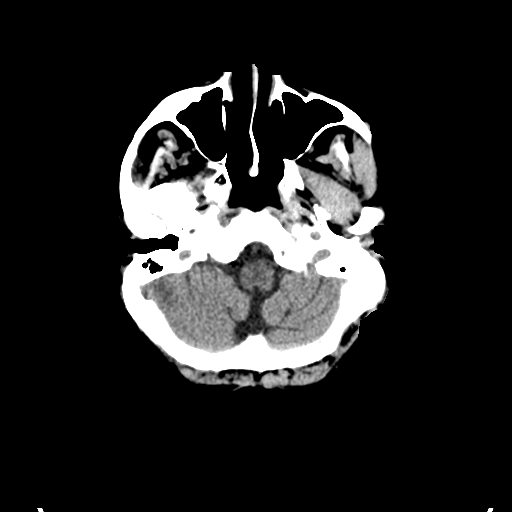
[im 9/31  brain]
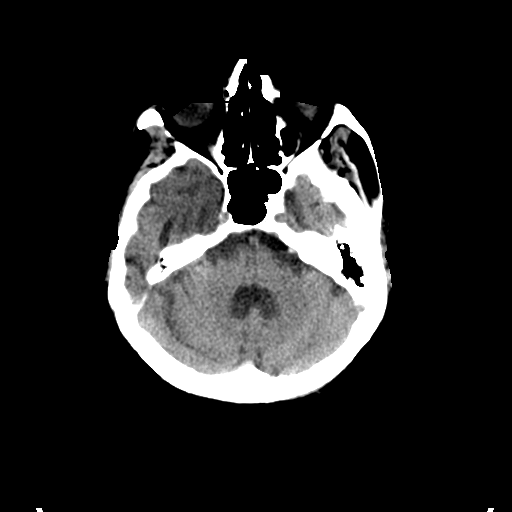
[im 11/31  brain]
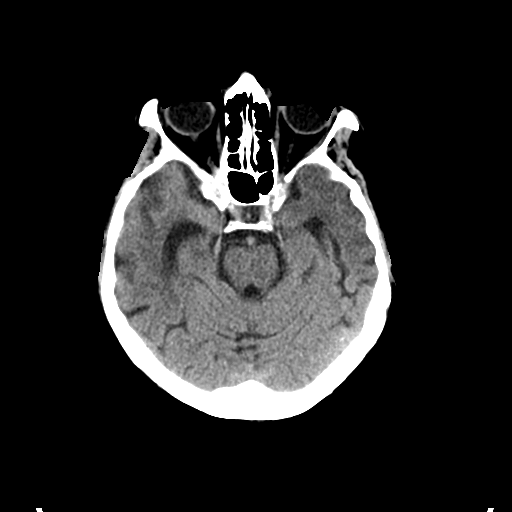
[im 14/31  brain]
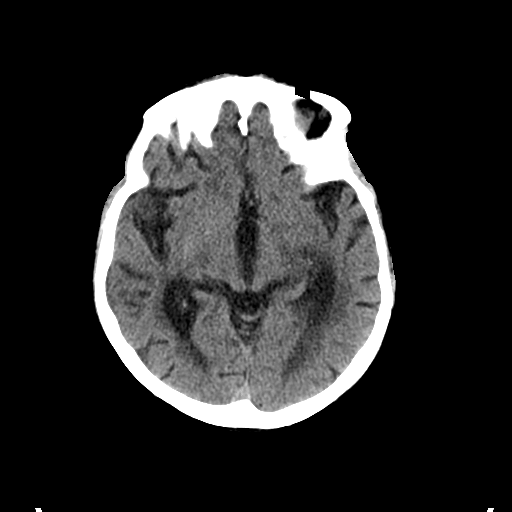
[im 14/31  bone]
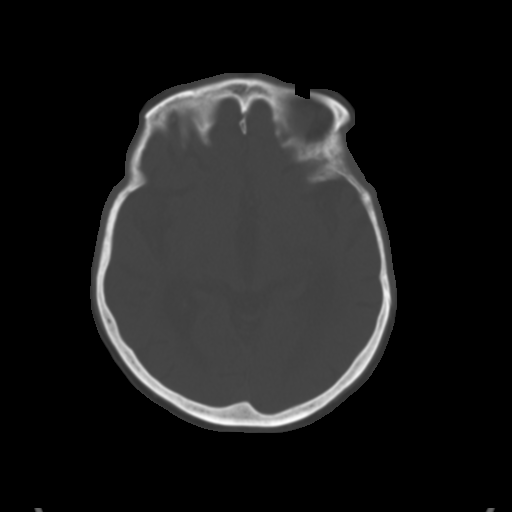
[im 17/31  brain]
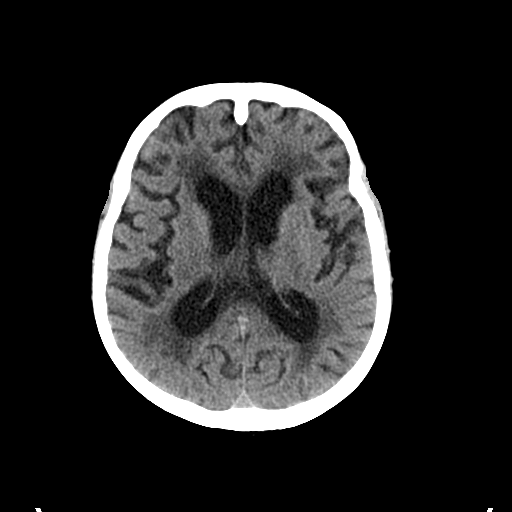
[im 20/31  brain]
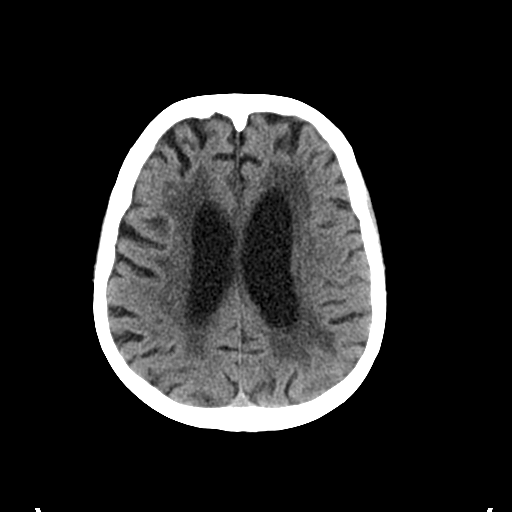
[im 23/31  brain]
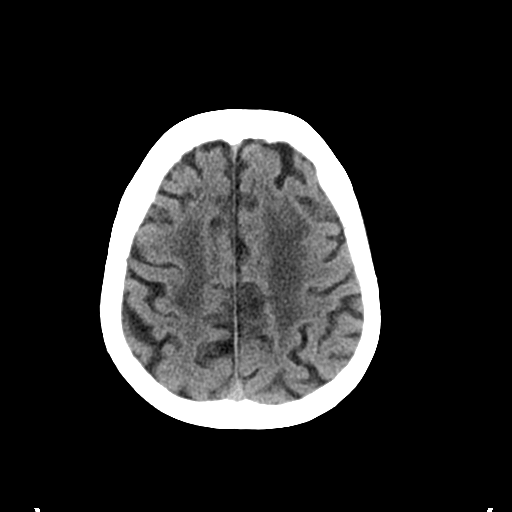
[im 25/31  brain]
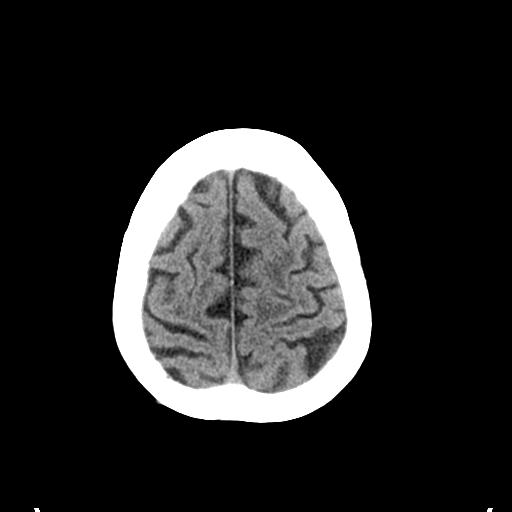
[im 25/31  bone]
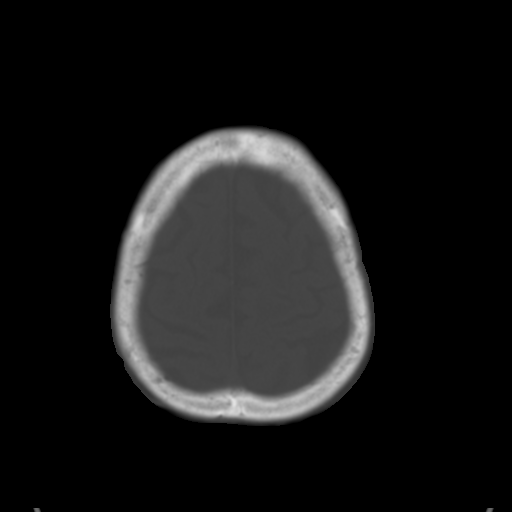
[im 28/31  brain]
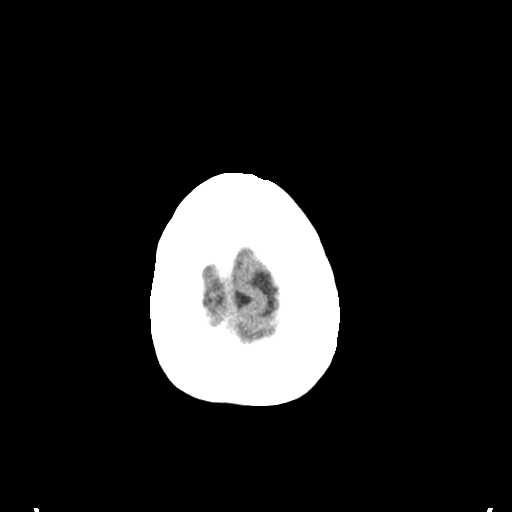

[Series 5: coronal soft tissue · coronal · 0.32mm/px · 3 of 64 slices shown]
[im 22/64  brain]
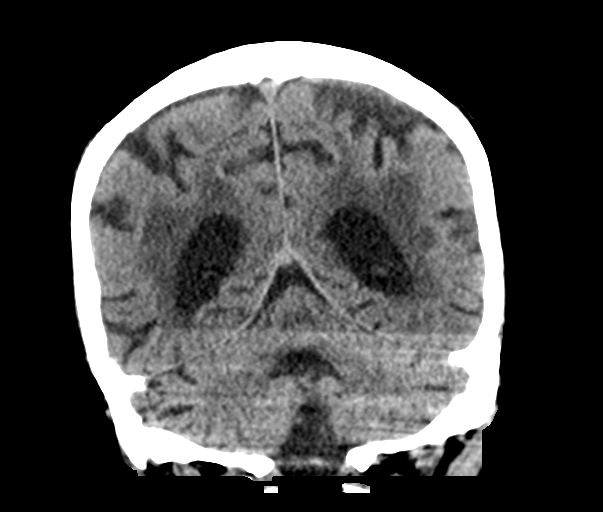
[im 29/64  brain]
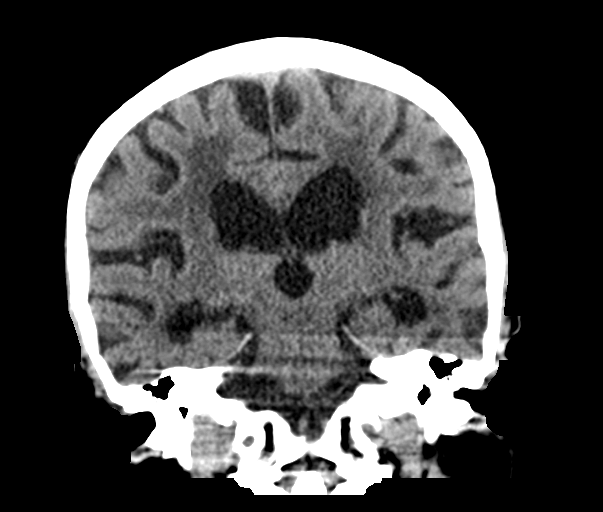
[im 36/64  brain]
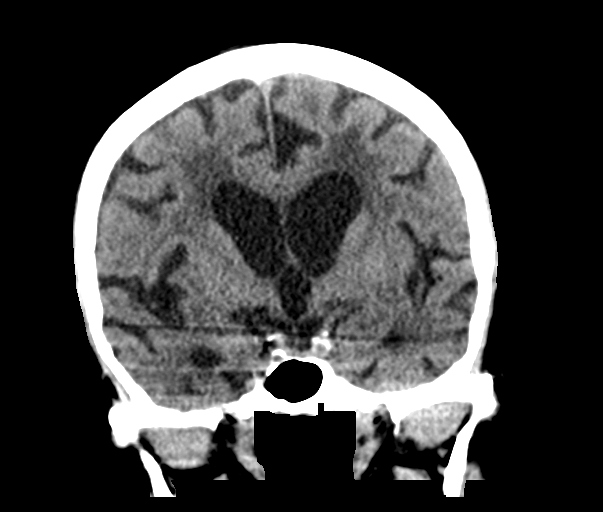

[Series 6: sagittal soft tissue · sagittal · 0.32mm/px · 3 of 65 slices shown]
[im 22/65  brain]
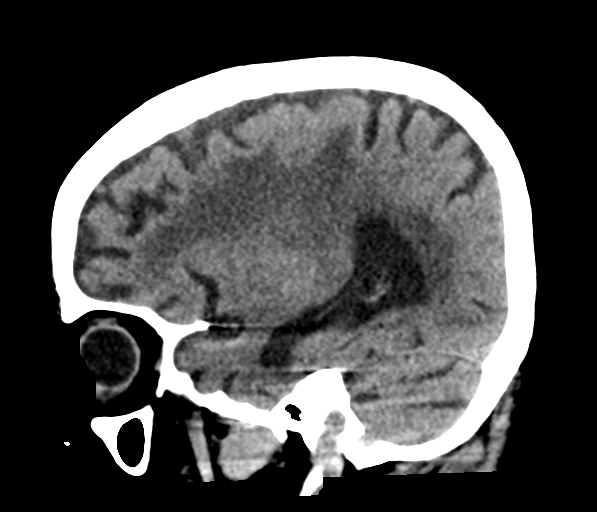
[im 33/65  brain]
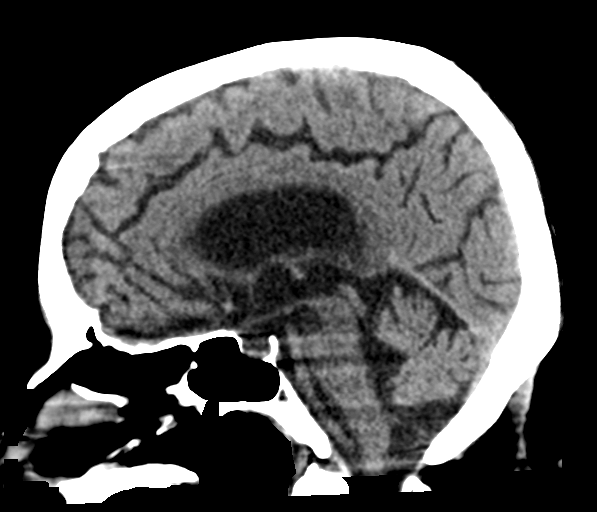
[im 43/65  brain]
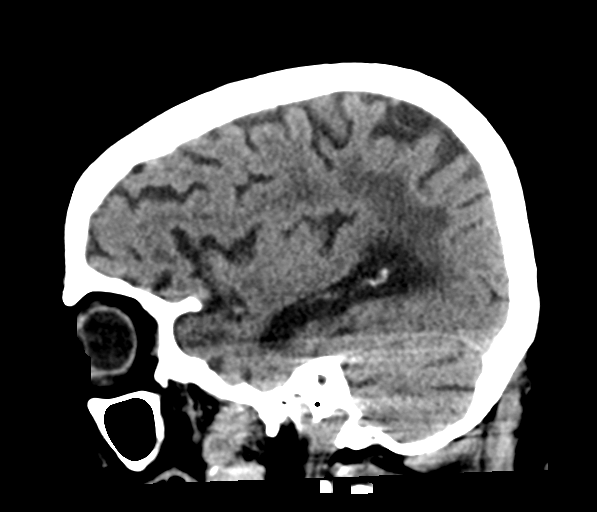

[16 of 47 positions shown; findings below may reference images not displayed]

FINDINGS: Brain: Diffuse parenchymal atrophy. Patchy areas of hypoattenuation
in deep and periventricular white matter bilaterally. Negative for
acute intracranial hemorrhage, mass lesion, acute infarction,
midline shift, or mass-effect. Acute infarct may be inapparent on
noncontrast CT. Ventricles and sulci symmetric.

Vascular: No hyperdense vessel or unexpected calcification.

Skull: Normal. Negative for fracture or focal lesion.

Sinuses/Orbits: Hypoplastic frontal sinuses.  Otherwise negative.

Other: None
IMPRESSION: 1. Negative for bleed or other acute intracranial process.
2. Atrophy and nonspecific white matter changes.

## 2022-08-22 NOTE — Telephone Encounter (Signed)
Ms. Hautala nurse with [name] stated that her Heart rate was 104 today and her plan of care is  2 week 2 1 week 2.

## 2022-08-24 ENCOUNTER — Other Ambulatory Visit: Payer: Self-pay | Admitting: Family Medicine

## 2022-08-25 DIAGNOSIS — D631 Anemia in chronic kidney disease: Secondary | ICD-10-CM | POA: Diagnosis not present

## 2022-08-25 DIAGNOSIS — N1831 Chronic kidney disease, stage 3a: Secondary | ICD-10-CM | POA: Diagnosis not present

## 2022-08-25 DIAGNOSIS — I130 Hypertensive heart and chronic kidney disease with heart failure and stage 1 through stage 4 chronic kidney disease, or unspecified chronic kidney disease: Secondary | ICD-10-CM | POA: Diagnosis not present

## 2022-08-25 DIAGNOSIS — E1122 Type 2 diabetes mellitus with diabetic chronic kidney disease: Secondary | ICD-10-CM | POA: Diagnosis not present

## 2022-08-25 DIAGNOSIS — I5032 Chronic diastolic (congestive) heart failure: Secondary | ICD-10-CM | POA: Diagnosis not present

## 2022-08-25 DIAGNOSIS — J449 Chronic obstructive pulmonary disease, unspecified: Secondary | ICD-10-CM | POA: Diagnosis not present

## 2022-08-29 DIAGNOSIS — E1122 Type 2 diabetes mellitus with diabetic chronic kidney disease: Secondary | ICD-10-CM | POA: Diagnosis not present

## 2022-08-29 DIAGNOSIS — D631 Anemia in chronic kidney disease: Secondary | ICD-10-CM | POA: Diagnosis not present

## 2022-08-29 DIAGNOSIS — N1831 Chronic kidney disease, stage 3a: Secondary | ICD-10-CM | POA: Diagnosis not present

## 2022-08-29 DIAGNOSIS — J449 Chronic obstructive pulmonary disease, unspecified: Secondary | ICD-10-CM | POA: Diagnosis not present

## 2022-08-29 DIAGNOSIS — I5032 Chronic diastolic (congestive) heart failure: Secondary | ICD-10-CM | POA: Diagnosis not present

## 2022-08-29 DIAGNOSIS — I130 Hypertensive heart and chronic kidney disease with heart failure and stage 1 through stage 4 chronic kidney disease, or unspecified chronic kidney disease: Secondary | ICD-10-CM | POA: Diagnosis not present

## 2022-08-30 ENCOUNTER — Telehealth: Payer: Self-pay

## 2022-08-30 ENCOUNTER — Telehealth: Payer: Self-pay | Admitting: Family Medicine

## 2022-08-30 DIAGNOSIS — I130 Hypertensive heart and chronic kidney disease with heart failure and stage 1 through stage 4 chronic kidney disease, or unspecified chronic kidney disease: Secondary | ICD-10-CM | POA: Diagnosis not present

## 2022-08-30 DIAGNOSIS — N1831 Chronic kidney disease, stage 3a: Secondary | ICD-10-CM | POA: Diagnosis not present

## 2022-08-30 DIAGNOSIS — E1122 Type 2 diabetes mellitus with diabetic chronic kidney disease: Secondary | ICD-10-CM | POA: Diagnosis not present

## 2022-08-30 DIAGNOSIS — I5032 Chronic diastolic (congestive) heart failure: Secondary | ICD-10-CM | POA: Diagnosis not present

## 2022-08-30 DIAGNOSIS — D631 Anemia in chronic kidney disease: Secondary | ICD-10-CM | POA: Diagnosis not present

## 2022-08-30 DIAGNOSIS — J449 Chronic obstructive pulmonary disease, unspecified: Secondary | ICD-10-CM | POA: Diagnosis not present

## 2022-08-30 NOTE — Telephone Encounter (Signed)
[name] - Client Nonclinical Telephone Record  [facility] Client [name] - Client Client Site [name] Provider Crissie Sickles - MD Contact Type Call Who Is Calling Patient / Member / Family / Caregiver Caller Name Bradley Phone Number [phone] Patient Name [name] Patient DOB 1946/06/19 Call Type Message Only Information Provided Reason for Call Request for General Office Information Initial Comment Caller states she is calling about a patient. She aid she wanted to give an update to the dr the patient had a congested cough oxygen was 97 heart rate was 102 and then 100. She did tell patient to use inhaler. Disp. Time Disposition Final User 08/30/2022 11:50:52 AM General Information Provided Yes Puentes, Yvette Call Closed By: Wilson Singer Transaction Date/Time: 08/30/2022 11:46:58 AM (ET)

## 2022-08-30 NOTE — Telephone Encounter (Signed)
Approved orders given to [facility].

## 2022-08-30 NOTE — Telephone Encounter (Signed)
Okay for verbal orders. 

## 2022-08-30 NOTE — Telephone Encounter (Signed)
Signed and put in box to go up front. Signed:  Crissie Sickles, MD           08/30/2022

## 2022-08-30 NOTE — Telephone Encounter (Signed)
Caller/Agency: Seth Bake with [facility] Number: [phone] Requesting OT/PT/Skilled Nursing/Social Work/Speech Requesting social work eval be moved to this current week.  Frequency: Current week

## 2022-08-30 NOTE — Telephone Encounter (Signed)
Forms faxed back.

## 2022-08-30 NOTE — Telephone Encounter (Signed)
Yes okay 

## 2022-08-30 NOTE — Telephone Encounter (Signed)
Home health orders received 08/30/22 for [name] initiation orders: Yes.  Home health re-certification orders: No. Patient last seen by ordering physician for this condition: 08/18/22. Must be less than 90 days for re-certification and less than 30 days prior for initiation. Visit must have been for the condition the orders are being placed.  Patient meets criteria for Physician to sign orders: Yes.        Current med list has been attached: Yes        Orders placed on physicians desk for signature: 08/30/22 (date) If patient does not meet criteria for orders to be signed: pt was called to schedule appt. Appt is scheduled for 11/18/22.   Emilee Hero

## 2022-09-01 DIAGNOSIS — N1831 Chronic kidney disease, stage 3a: Secondary | ICD-10-CM | POA: Diagnosis not present

## 2022-09-01 DIAGNOSIS — D631 Anemia in chronic kidney disease: Secondary | ICD-10-CM | POA: Diagnosis not present

## 2022-09-01 DIAGNOSIS — I130 Hypertensive heart and chronic kidney disease with heart failure and stage 1 through stage 4 chronic kidney disease, or unspecified chronic kidney disease: Secondary | ICD-10-CM | POA: Diagnosis not present

## 2022-09-01 DIAGNOSIS — E1122 Type 2 diabetes mellitus with diabetic chronic kidney disease: Secondary | ICD-10-CM | POA: Diagnosis not present

## 2022-09-01 DIAGNOSIS — J449 Chronic obstructive pulmonary disease, unspecified: Secondary | ICD-10-CM | POA: Diagnosis not present

## 2022-09-01 DIAGNOSIS — I5032 Chronic diastolic (congestive) heart failure: Secondary | ICD-10-CM | POA: Diagnosis not present

## 2022-09-01 NOTE — Telephone Encounter (Signed)
LM for Anissa regarding recommendations.

## 2022-09-01 NOTE — Telephone Encounter (Signed)
Anissa called back regarding message she left 2 days ago regarding patient cough and a little of SOB. Today, she said that urine has a foul smell.  I scheduled an appointment for her tomorrow with Dr. Anitra Lauth 11/10 at [place] nurse wants to know what she can do about cough today until appt.  Please advise  [phone] - Anissa

## 2022-09-01 NOTE — Telephone Encounter (Signed)
Mucinex DM or Robitussin-DM over-the-counter as directed on packaging.

## 2022-09-01 NOTE — Telephone Encounter (Signed)
Please review and advise.

## 2022-09-01 NOTE — Telephone Encounter (Signed)
[name] was advised of recommendations.

## 2022-09-02 ENCOUNTER — Ambulatory Visit (INDEPENDENT_AMBULATORY_CARE_PROVIDER_SITE_OTHER): Payer: Medicare Other | Admitting: Family Medicine

## 2022-09-02 ENCOUNTER — Encounter: Payer: Self-pay | Admitting: Family Medicine

## 2022-09-02 VITALS — BP 105/70 | HR 116 | Temp 97.9°F | Ht 60.0 in | Wt 138.2 lb

## 2022-09-02 DIAGNOSIS — R8290 Unspecified abnormal findings in urine: Secondary | ICD-10-CM

## 2022-09-02 DIAGNOSIS — R0609 Other forms of dyspnea: Secondary | ICD-10-CM | POA: Diagnosis not present

## 2022-09-02 DIAGNOSIS — N390 Urinary tract infection, site not specified: Secondary | ICD-10-CM

## 2022-09-02 DIAGNOSIS — Z8619 Personal history of other infectious and parasitic diseases: Secondary | ICD-10-CM

## 2022-09-02 DIAGNOSIS — R000 Tachycardia, unspecified: Secondary | ICD-10-CM

## 2022-09-02 LAB — BASIC METABOLIC PANEL
BUN: 21 mg/dL (ref 6–23)
CO2: 27 mEq/L (ref 19–32)
Calcium: 9 mg/dL (ref 8.4–10.5)
Chloride: 99 mEq/L (ref 96–112)
Creatinine, Ser: 1.25 mg/dL — ABNORMAL HIGH (ref 0.40–1.20)
GFR: 42.55 mL/min — ABNORMAL LOW (ref >60.00)
Glucose, Bld: 335 mg/dL — ABNORMAL HIGH (ref 70–99)
Potassium: 4.6 mEq/L (ref 3.5–5.1)
Sodium: 135 mEq/L (ref 135–145)

## 2022-09-02 LAB — CBC WITH DIFFERENTIAL/PLATELET
Basophils Absolute: 0 10*3/uL (ref 0.0–0.1)
Basophils Relative: 0.1 % (ref 0.0–3.0)
Eosinophils Absolute: 0.1 10*3/uL (ref 0.0–0.7)
Eosinophils Relative: 1 % (ref 0.0–5.0)
HCT: 38.1 % (ref 36.0–46.0)
Hemoglobin: 12.5 g/dL (ref 12.0–15.0)
Lymphocytes Relative: 10.4 % — ABNORMAL LOW (ref 12.0–46.0)
Lymphs Abs: 0.8 10*3/uL (ref 0.7–4.0)
MCHC: 33 g/dL (ref 30.0–36.0)
MCV: 88.1 fl (ref 78.0–100.0)
Monocytes Absolute: 0.7 10*3/uL (ref 0.1–1.0)
Monocytes Relative: 9.9 % (ref 3.0–12.0)
Neutro Abs: 5.8 10*3/uL (ref 1.4–7.7)
Neutrophils Relative %: 78.6 % — ABNORMAL HIGH (ref 43.0–77.0)
Platelets: 212 10*3/uL (ref 150.0–400.0)
RBC: 4.32 Mil/uL (ref 3.87–5.11)
RDW: 17.7 % — ABNORMAL HIGH (ref 11.5–15.5)
WBC: 7.4 10*3/uL (ref 4.0–10.5)

## 2022-09-02 MED ORDER — CEFDINIR 300 MG PO CAPS: ORAL_CAPSULE | ORAL | 0 refills | Status: DC

## 2022-09-02 MED ORDER — BUDESONIDE-FORMOTEROL FUMARATE 160-4.5 MCG/ACT IN AERO: 2.0000 | INHALATION_SPRAY | Freq: Two times a day (BID) | RESPIRATORY_TRACT | 6 refills | Status: DC

## 2022-09-02 MED ORDER — CEFTRIAXONE SODIUM 1 G IJ SOLR
1.0000 g | Freq: Once | INTRAMUSCULAR | Status: AC
  Administered 2022-09-02: 1 g via INTRAMUSCULAR

## 2022-09-02 NOTE — Progress Notes (Signed)
OFFICE VISIT  09/02/2022  CC: Cough, foul urine odor  Patient is a 76 y.o. female who presents accompanied by her daughter [name] for cough and concern of foul urine odor. I last saw her 2 weeks ago. A/P as of that visit: "#1 SIRS, with COPD exacerbation. Staph hominis grew into blood cultures.  Follow-up blood cultures negative.  She has had appropriate antibiotics and systemic steroids and is improving significantly/appropriately. She is slightly tachycardic today.  Has not had albuterol today but did take 2 puffs of her Symbicort. We will decrease Symbicort to 1 puff twice daily.   #2 major depressive disorder, mild, recurrent. This is in the setting of cognitive impairment, mild dementia. Tolerating recent increase in Wellbutrin to 300 mg/day, no improvement yet."  INTERIM HX: Significant fatigue last several days.  Few nights ago daughter noticed that Megin "rattled "when she went to bed.  Significant dyspnea on exertion last couple of days, just stayed on the couch or in bed all day yesterday.  P.o. intake is down some.  No nausea vomiting or diarrhea.  No fever.  Patient states her cough is not productive and she has no chest pain.  No lower extremity swelling.  Home oxygen saturations on room air have been 90 to 95%, heart rate 110-125.  Urine has had a foul odor.  Patient denies dysuria.    Past Medical History:  Diagnosis Date   Acute renal failure (HCC)    Age-related nuclear cataract of both eyes 2016   +cortical age related cataracts OU    Anxiety and depression    Bilateral diabetic retinopathy ([place]) 2015   Dr. 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   Colonic diverticular abscess    COPD (chronic obstructive pulmonary disease) (HCC)    CVA (cerebral vascular accident) ([place])  09/2016   MRI did show a left-sided small ischemic stroke which was acute but likely incidental (MRI was done b/c pt had TIA sx's in hospital).  Neuro put her on plavix at that time.  Cardiology d/c'd her plavix 08/2017.   Diabetes mellitus with complication (HCC)    diab retinopathy OU (laser)   Diverticulitis of large intestine with perforation and abscess 09/11/2016   Finger fracture, right 04/2020   R 5th metacarpal fx at the base (Dr. Mardelle Matte)   GERD (gastroesophageal reflux disease)    protonix   History of concussion 08/25/2018   w/out loss of consciousness--mild increase in baseline memory impairment after.  CT head neg acute.   History of diverticulitis of colon    with abscess; required IR percutaneous drain placed 09/2016.   History of iron deficiency anemia    started iron 07/02/19.  EGD/colonoscopy 08/27/19 unrevealing. Capsule endoscopy considered but never done.   Hyperlipidemia    Hypertension    Left bundle branch block (LBBB) 12/16/2016   Lung cancer ([place])    non-small cell lung ca, stage III in 05/2011; systemic chemotherapy concurrent with radiation followed by prophylactic cranial irradiation and has been observation since July of 2010 with no evidence for disease recurrence-released from onc f/u 12/2014 (needs annual cxr by PCP).  CXR 08/2015 stable.  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 Earlie Server did bx and this was NEG for malignancy.   Mild cognitive impairment with memory loss    Likely  from brain radiation therapy   Non-obstructive CAD (coronary artery disease)    a. Cath 2006 preserved LV fxn, scattered irregularities without critical stenosis; b. 2008 stress echo negative for ischemia, but with hypertensive response; c. 08/2016 Cath: D1 25%, otw nl.   Normocytic anemia 2018   Iron, vit B12, folate all normal 12/2016.   Open-angle glaucoma 2016   Dr. Shirley Muscat, (bilateral)---responding to topical therapy   Osteoarthritis of both hands    Osteoporosis 03/2016; 03/2018    03/2016 DEXA T-score -2.2.  03/2018 T-score L femoral neck  -2.7--boniva started. DEXA 04/2021 T scor -2.6->boniva continued. Plan rpt DEXA 2 yrs.   Pneumonia    hospitalization 11/2016; hypoxemic resp failure--d/c'd home with home oxygen therapy   Pulmonary nodule, left 08/2021   LUL 11 mm ground glass, needs f/u CT 08/2022.   Seasonal allergic rhinitis    Subclinical hyperthyroidism 2018   Takotsubo cardiomyopathy    a. 08/2016 Echo: EF 30-35%, gr1DD, PASP 77mHg;  b. 08/2016 Cath: nonobs Dzs;  c. 11/2016 Echo: EF 40-45%, no rwma, nl RV fxn, mild TR, PASP 320mg.  ECHO 02/2017 EF 50-55%, grd II DD---essentially resolved Takotsubo 02/2017.   [place]   a. 08/2016 in setting of diverticulitis & pneumoperitoneum and Takotsubo CM -->prolonged QT, seen by EP-->avoid meds with potential for QT prolongation.    Past Surgical History:  Procedure Laterality Date   ABDOMINAL HYSTERECTOMY  1997   APPENDECTOMY     CARDIAC CATHETERIZATION N/A 09/14/2016   Procedure: Left Heart Cath and Coronary Angiography;  Surgeon: ThTroy SineMD;  Location[facility];  Service: Cardiovascular; Mild nonobstructive CAD with 85% smooth narrowing in the first diagonal branch of the LAD; 10% smooth narrowing of the ostial proximal left circumflex coronary artery; and a normal dominant RCA.   CARPAL TUNNEL RELEASE     CATARACT EXTRACTION Bilateral    CHOLECYSTECTOMY  2002   COLONOSCOPY  10/24/00; 08/27/19   2002 normal.  [employer] hemoccult testing via [facility] 06/18/15 was NEG.  2020->diverticulosis o/w nl.   dexa  03/2016; 03/2018   03/2018 T-score L femoral neck  -2.7 (worsened compared to osteopenic range 2017.) DEXA 05/03/21 stable T score -2.6 on boniva x 2 yrs->plan rpt DEXA 2 yrs.   ESOPHAGOGASTRODUODENOSCOPY  08/27/2019   Normal.   IR GENERIC HISTORICAL  09/19/2016   IR SINUS/FIST TUBE CHK-NON GI 09/19/2016 JaCorrie MckusickDO [facility]   IR GENERIC HISTORICAL  10/06/2016   IR  RADIOLOGIST EVAL & MGMT 10/06/2016 [facility]   IR GENERIC HISTORICAL  10/26/2016   IR RADIOLOGIST EVAL & MGMT 10/26/2016 [facility]   IR GENERIC HISTORICAL  11/03/2016   IR RADIOLOGIST EVAL & MGMT 11/03/2016 WeArdis RowanPA-C [facility]   IR GENERIC HISTORICAL  11/24/2016   IR RADIOLOGIST EVAL & MGMT 11/24/2016 WeArdis RowanPA-C [facility]   IR GENERIC HISTORICAL  12/05/2016   Fistula smaller/improving.  IR SINUS/FIST TUBE CHK-NON GI 12/05/2016 JoSandi MariscalMD [facility]   IR GENERIC HISTORICAL  12/22/2016   IR RADIOLOGIST EVAL & MGMT 12/22/2016 MiGreggory KeenMD [facility]   IR RADIOLOGIST EVAL & MGMT  01/10/2017   IR RADIOLOGIST EVAL & MGMT  02/09/2017   IR SINUS/FIST TUBE CHK-NON GI  03/28/2017   LEFT HEART CATHETERIZATION WITH CORONARY ANGIOGRAM N/A 05/30/2012   Procedure: LEFT HEART CATHETERIZATION WITH CORONARY ANGIOGRAM;  Surgeon: ChBurnell BlanksMD;  [facility];  Service: Cardiovascular: Mild, non-obstructive CAD   TRANSTHORACIC ECHOCARDIOGRAM  09/14/2016; 11/2016   08/2016: EF 30-35 %, Akinesis of the mid-apicalanteroseptal myocardium.  Grade I DD.  Mild pulm HTN.  Repeat echo 11/2016, EF 40-45%, no rwma, nl RV fxn, mild TR, PASP 61mHg.     TRANSTHORACIC ECHOCARDIOGRAM  03/07/2017   EF 50-55%. Hypokinesis of the distal septum with overall low normal LV,  systolic function; mild diastolic dysfunction with elevated LV  filling pressure; mildly calcified aortic valve with mild AI; small pericardial effusion    Outpatient Medications Prior to Visit  Medication Sig Dispense Refill   acetaminophen (TYLENOL) 500 MG tablet Take 1,000 mg by mouth daily as needed for headache.     aspirin 81 MG EC tablet Take 81 mg by mouth daily.      atorvastatin (LIPITOR) 40 MG tablet Take 1 tablet (40 mg total) by mouth daily. (Patient taking differently: Take 40 mg by mouth at bedtime.) 90 tablet 1   buPROPion (WELLBUTRIN XL) 300 MG 24 hr  tablet Take 1 tablet by mouth once daily 90 tablet 0   Calcium Carb-Cholecalciferol (CALCIUM + VITAMIN D3 PO) Take 1-2 tablets by mouth See admin instructions. Take 1 tablet by mouth in the morning and 2 tablets by mouth at bedtime     cetirizine (ZYRTEC) 10 MG tablet Take 10 mg by mouth every evening.     guaiFENesin-dextromethorphan (ROBITUSSIN DM) 100-10 MG/5ML syrup Take 5 mLs by mouth every 4 (four) hours as needed for cough (chest congestion). 118 mL 0   ibandronate (BONIVA) 150 MG tablet Take 1 tablet (150 mg total) by mouth every 30 (thirty) days. Take in the morning with a full glass of water, on an empty stomach, and do not take anything else by mouth or lie down for the next 30 min. 3 tablet 3   insulin isophane & regular human KwikPen (HUMULIN 70/30 KWIKPEN) (70-30) 100 UNIT/ML KwikPen INJECT 4 UNITS SUBCUTANEOUSLY WITH BREAKFAST AND 2 UNITS WITH SUPPER (Patient taking differently: Inject 4 Units into the skin 2 (two) times daily.) 15 mL 1   Multiple Vitamins-Minerals (MULTIVITAMIN WOMEN PO) Take 1 tablet by mouth daily.     pantoprazole (PROTONIX) 40 MG tablet Take 1 tablet (40 mg total) by mouth daily. 90 tablet 1   Spacer/Aero-Holding Chambers (AEROCHAMBER MV) inhaler Use as instructed 1 each 2   budesonide-formoterol (SYMBICORT) 160-4.5 MCG/ACT inhaler Inhale 2 puffs into the lungs 2 (two) times daily. (Patient taking differently: Inhale 1 puff into the lungs 2 (two) times daily.) 1 each 6   albuterol (PROVENTIL) (2.5 MG/3ML) 0.083% nebulizer solution USE 1 VIAL IN NEBULIZER EVERY 4 HOURS AS NEEDED FOR WHEEZING AND FOR SHORTNESS OF BREATH (Patient not taking: Reported on 08/10/2022) 90 mL 1   albuterol (VENTOLIN HFA) 108 (90 Base) MCG/ACT inhaler INHALE 2 PUFFS BY MOUTH EVERY 6 HOURS AS NEEDED FOR WHEEZING OR SHORTNESS OF BREATH (Patient not taking: Reported on 08/10/2022) 18 g 1   No facility-administered medications prior to visit.    Allergies  Allergen Reactions   Lactose  Intolerance (Gi) Diarrhea   Motrin [Ibuprofen] Itching, Anxiety and Other (See Comments)    Anxiousness Hyperventilates     ROS As per HPI  PE:    09/02/2022   10:57 AM 08/18/2022    9:52 AM 08/13/2022    1:42 PM  Vitals with BMI  Height _0  _1    Weight 138 lbs 3 oz 140 lbs 3 oz  BMI 16.10 96.04   Systolic 540 981 191  Diastolic 70 66 54  Pulse 478 102 97   Physical Exam  Gen: Alert, chronically-ill appearing.  No distress.  Patient is oriented to person, place, time, and situation. CV: Regular rhythm, tachy to 115, no pause or ectopy, no rub LUNGS: CTA bilat, nonlabored, aeration is good. EXT: no clubbing or cyanosis.  no edema.   LABS:  Last CBC Lab Results  Component Value Date   WBC 8.9 08/13/2022   HGB 11.3 (L) 08/13/2022   HCT 35.7 (L) 08/13/2022   MCV 89.3 08/13/2022   MCH 28.3 08/13/2022   RDW 15.1 08/13/2022   PLT 208 08/13/2022   Lab Results  Component Value Date   IRON 44 10/11/2019   TIBC 236 (L) 10/11/2019   FERRITIN 86 10/11/2019   Lab Results  Component Value Date   VITAMINB12 1,454 (H) 29/56/2130   Last metabolic panel Lab Results  Component Value Date   GLUCOSE 116 (H) 08/13/2022   NA 139 08/13/2022   K 4.2 08/13/2022   CL 105 08/13/2022   CO2 28 08/13/2022   BUN 25 (H) 08/13/2022   CREATININE 0.97 08/13/2022   GFRNONAA >60 08/13/2022   CALCIUM 9.6 08/13/2022   PHOS 2.9 05/22/2022   PROT 5.9 (L) 08/11/2022   ALBUMIN 3.0 (L) 08/11/2022   LABGLOB 2.7 09/19/2018   AGRATIO 1.6 09/19/2018   BILITOT 0.5 08/11/2022   ALKPHOS 64 08/11/2022   AST 13 (L) 08/11/2022   ALT 13 08/11/2022   ANIONGAP 6 08/13/2022   Lab Results  Component Value Date   HGBA1C 7.3 (A) 08/04/2022   HGBA1C 7.3 08/04/2022   HGBA1C 7.3 (A) 08/04/2022   HGBA1C 7.3 (A) 08/04/2022   Lab Results  Component Value Date   DDIMER 1.19 (H) 10/15/2019   IMPRESSION AND PLAN:  #1 fatigue and malaise, foul urine odor. She was unable to give a urine sample  today. History of recurrent UTI. Rocephin 1 g IM in office today. Start cefdinir 300 twice daily x5 days tomorrow. Patient daughter will try to bring in urine sample back for analysis and culture.  #2 dyspnea on exertion. Suspect this is due to her fatigue and malaise associated with #1 above. Low suspicion of ACS, pneumonia, COPD exacerbation, heart failure, or PE. However, if not responding to antibiotics then will proceed with CT angio chest. Of note, we will go ahead and go back up on her Symbicort to 2 puffs twice daily.  #3 tachycardia.  Suspect due to #1 above. Decreasing Symbicort to 1 puff twice a day last visit did not result in resolution of tachycardia. Again, if no improvement with today's treatments then will proceed with CT to rule out PE.  CBC and bmet today.  An After Visit Summary was printed and given to the patient.  FOLLOW UP: Return for 3-4d f/u fatigue/uti.  Signed:  Crissie Sickles, MD           09/02/2022

## 2022-09-05 ENCOUNTER — Encounter: Payer: Self-pay | Admitting: Family Medicine

## 2022-09-05 ENCOUNTER — Ambulatory Visit (INDEPENDENT_AMBULATORY_CARE_PROVIDER_SITE_OTHER): Payer: Medicare Other | Admitting: Family Medicine

## 2022-09-05 VITALS — BP 109/68 | HR 107 | Temp 97.4°F | Ht 60.0 in | Wt 138.2 lb

## 2022-09-05 DIAGNOSIS — N179 Acute kidney failure, unspecified: Secondary | ICD-10-CM

## 2022-09-05 DIAGNOSIS — D631 Anemia in chronic kidney disease: Secondary | ICD-10-CM | POA: Diagnosis not present

## 2022-09-05 DIAGNOSIS — R000 Tachycardia, unspecified: Secondary | ICD-10-CM | POA: Diagnosis not present

## 2022-09-05 DIAGNOSIS — I130 Hypertensive heart and chronic kidney disease with heart failure and stage 1 through stage 4 chronic kidney disease, or unspecified chronic kidney disease: Secondary | ICD-10-CM | POA: Diagnosis not present

## 2022-09-05 DIAGNOSIS — N390 Urinary tract infection, site not specified: Secondary | ICD-10-CM

## 2022-09-05 DIAGNOSIS — T50905D Adverse effect of unspecified drugs, medicaments and biological substances, subsequent encounter: Secondary | ICD-10-CM | POA: Diagnosis not present

## 2022-09-05 DIAGNOSIS — E1122 Type 2 diabetes mellitus with diabetic chronic kidney disease: Secondary | ICD-10-CM | POA: Diagnosis not present

## 2022-09-05 DIAGNOSIS — N1831 Chronic kidney disease, stage 3a: Secondary | ICD-10-CM | POA: Diagnosis not present

## 2022-09-05 DIAGNOSIS — J449 Chronic obstructive pulmonary disease, unspecified: Secondary | ICD-10-CM | POA: Diagnosis not present

## 2022-09-05 DIAGNOSIS — I5032 Chronic diastolic (congestive) heart failure: Secondary | ICD-10-CM | POA: Diagnosis not present

## 2022-09-05 NOTE — Patient Instructions (Signed)
Decrease wellbutrin to 150 mg daily

## 2022-09-05 NOTE — Progress Notes (Signed)
OFFICE VISIT  09/05/2022  CC:  Chief Complaint  Patient presents with   Follow-up    UTI   Patient is a 76 y.o. female who presents for 3-day follow-up presumed UTI. A/P as of last visit: "#1 fatigue and malaise, foul urine odor. She was unable to give a urine sample today. History of recurrent UTI. Rocephin 1 g IM in office today. Start cefdinir 300 twice daily x5 days tomorrow. Patient daughter will try to bring in urine sample back for analysis and culture.   #2 dyspnea on exertion. Suspect this is due to her fatigue and malaise associated with #1 above. Low suspicion of ACS, pneumonia, COPD exacerbation, heart failure, or PE. However, if not responding to antibiotics then will proceed with CT angio chest. Of note, we will go ahead and go back up on her Symbicort to 2 puffs twice daily.   #3 tachycardia.  Suspect due to #1 above. Decreasing Symbicort to 1 puff twice a day last visit did not result in resolution of tachycardia. Again, if no improvement with today's treatments then will proceed with CT to rule out PE"  INTERIM HX: CBC normal last visit.  We met showed serum creatinine up to 1.25 (GFR low 40s, her baseline is low 50s), glucose 335.  She is feeling much better. Denies palpitations or feeling of her heart beating fast at all. She does have a chronic cough and some recurrent wheeze but this does not sound like it is over her baseline.  Past Medical History:  Diagnosis Date   Acute renal failure (HCC)    Age-related nuclear cataract of both eyes 2016   +cortical age related cataracts OU    Anxiety and depression    Bilateral diabetic retinopathy ([place]) 2015   Dr. 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   Colonic diverticular abscess    COPD (chronic obstructive pulmonary  disease) (HCC)    CVA (cerebral vascular accident) ([place]) 09/2016   MRI did show a left-sided small ischemic stroke which was acute but likely incidental (MRI was done b/c pt had TIA sx's in hospital).  Neuro put her on plavix at that time.  Cardiology d/c'd her plavix 08/2017.   Diabetes mellitus with complication (HCC)    diab retinopathy OU (laser)   Diverticulitis of large intestine with perforation and abscess 09/11/2016   Finger fracture, right 04/2020   R 5th metacarpal fx at the base (Dr. Mardelle Matte)   GERD (gastroesophageal reflux disease)    protonix   History of concussion 08/25/2018   w/out loss of consciousness--mild increase in baseline memory impairment after.  CT head neg acute.   History of diverticulitis of colon    with abscess; required IR percutaneous drain placed 09/2016.   History of iron deficiency anemia    started iron 07/02/19.  EGD/colonoscopy 08/27/19 unrevealing. Capsule endoscopy considered but never done.   Hyperlipidemia    Hypertension    Left bundle branch block (LBBB) 12/16/2016   Lung cancer ([place])    non-small cell lung ca, stage III in 05/2011; systemic chemotherapy concurrent with radiation followed by prophylactic cranial irradiation and has been observation since July of 2010 with no evidence for disease recurrence-released from onc f/u 12/2014 (needs annual cxr by PCP).  CXR 08/2015 stable.  CT 07/2017 with ?  sternal met--Dr. Earlie Server did bx and this was NEG for malignancy.   Mild cognitive impairment with memory loss    Likely from brain radiation therapy   Non-obstructive CAD (coronary artery disease)    a. Cath 2006 preserved LV fxn, scattered irregularities without critical stenosis; b. 2008 stress echo negative for ischemia, but with hypertensive response; c. 08/2016 Cath: D1 25%, otw nl.   Normocytic anemia 2018   Iron, vit B12, folate all normal 12/2016.   Open-angle glaucoma 2016   Dr. Shirley Muscat, (bilateral)---responding to topical therapy    Osteoarthritis of both hands    Osteoporosis 03/2016; 03/2018   03/2016 DEXA T-score -2.2.  03/2018 T-score L femoral neck  -2.7--boniva started. DEXA 04/2021 T scor -2.6->boniva continued. Plan rpt DEXA 2 yrs.   Pneumonia    hospitalization 11/2016; hypoxemic resp failure--d/c'd home with home oxygen therapy   Pulmonary nodule, left 08/2021   LUL 11 mm ground glass, needs f/u CT 08/2022.   Seasonal allergic rhinitis    Subclinical hyperthyroidism 2018   Takotsubo cardiomyopathy    a. 08/2016 Echo: EF 30-35%, gr1DD, PASP 28mHg;  b. 08/2016 Cath: nonobs Dzs;  c. 11/2016 Echo: EF 40-45%, no rwma, nl RV fxn, mild TR, PASP 32mg.  ECHO 02/2017 EF 50-55%, grd II DD---essentially resolved Takotsubo 02/2017.   Torsades de pointes ([facility]   a. 08/2016 in setting of diverticulitis & pneumoperitoneum and Takotsubo CM -->prolonged QT, seen by EP-->avoid meds with potential for QT prolongation.    Past Surgical History:  Procedure Laterality Date   ABDOMINAL HYSTERECTOMY  1997   APPENDECTOMY     CARDIAC CATHETERIZATION N/A 09/14/2016   Procedure: Left Heart Cath and Coronary Angiography;  Surgeon: ThTroy SineMD;  [facility];  Service: Cardiovascular; Mild nonobstructive CAD with 85% smooth narrowing in the first diagonal branch of the LAD; 10% smooth narrowing of the ostial proximal left circumflex coronary artery; and a normal dominant RCA.   CARPAL TUNNEL RELEASE     CATARACT EXTRACTION Bilateral    CHOLECYSTECTOMY  2002   COLONOSCOPY  10/24/00; 08/27/19   2002 normal.  [employer] hemoccult testing via [facility] 06/18/15 was NEG.  2020->diverticulosis o/w nl.   dexa  03/2016; 03/2018   03/2018 T-score L femoral neck  -2.7 (worsened compared to osteopenic range 2017.) DEXA 05/03/21 stable T score -2.6 on boniva x 2 yrs->plan rpt DEXA 2 yrs.   ESOPHAGOGASTRODUODENOSCOPY  08/27/2019   Normal.   IR GENERIC HISTORICAL  09/19/2016   IR SINUS/FIST TUBE CHK-NON GI 09/19/2016 JaCorrie MckusickDO  [facility]   IR GENERIC HISTORICAL  10/06/2016   IR RADIOLOGIST EVAL & MGMT 10/06/2016 [facility]   IR GENERIC HISTORICAL  10/26/2016   IR RADIOLOGIST EVAL & MGMT 10/26/2016 [facility]   IR GENERIC HISTORICAL  11/03/2016   IR RADIOLOGIST EVAL & MGMT 11/03/2016 WeArdis RowanPA-C [facility]   IR GENERIC HISTORICAL  11/24/2016   IR RADIOLOGIST EVAL & MGMT 11/24/2016 WeArdis RowanPA-C [facility]   IR GENERIC HISTORICAL  12/05/2016   Fistula smaller/improving.  IR SINUS/FIST TUBE CHK-NON GI 12/05/2016 JoSandi MariscalMD [facility]   IR GENERIC HISTORICAL  12/22/2016   IR RADIOLOGIST EVAL & MGMT 12/22/2016 MiGreggory KeenMD [facility]   IR RADIOLOGIST EVAL & MGMT  01/10/2017   IR RADIOLOGIST EVAL & MGMT  02/09/2017   IR SINUS/FIST TUBE CHK-NON GI  03/28/2017   LEFT HEART CATHETERIZATION WITH  CORONARY ANGIOGRAM N/A 05/30/2012   Procedure: LEFT HEART CATHETERIZATION WITH CORONARY ANGIOGRAM;  Surgeon: Burnell Blanks, MD;  Location: [facility] CATH LAB;  Service: Cardiovascular: Mild, non-obstructive CAD   TRANSTHORACIC ECHOCARDIOGRAM  09/14/2016; 11/2016   08/2016: EF 30-35 %, Akinesis of the mid-apicalanteroseptal myocardium.  Grade I DD.  Mild pulm HTN.  Repeat echo 11/2016, EF 40-45%, no rwma, nl RV fxn, mild TR, PASP 85mHg.     TRANSTHORACIC ECHOCARDIOGRAM  03/07/2017   EF 50-55%. Hypokinesis of the distal septum with overall low normal LV,  systolic function; mild diastolic dysfunction with elevated LV  filling pressure; mildly calcified aortic valve with mild AI; small pericardial effusion    Outpatient Medications Prior to Visit  Medication Sig Dispense Refill   acetaminophen (TYLENOL) 500 MG tablet Take 1,000 mg by mouth daily as needed for headache.     albuterol (PROVENTIL) (2.5 MG/3ML) 0.083% nebulizer solution USE 1 VIAL IN NEBULIZER EVERY 4 HOURS AS NEEDED FOR WHEEZING AND FOR SHORTNESS OF BREATH 90 mL 1   albuterol (VENTOLIN HFA) 108 (90  Base) MCG/ACT inhaler INHALE 2 PUFFS BY MOUTH EVERY 6 HOURS AS NEEDED FOR WHEEZING OR SHORTNESS OF BREATH 18 g 1   aspirin 81 MG EC tablet Take 81 mg by mouth daily.      atorvastatin (LIPITOR) 40 MG tablet Take 1 tablet (40 mg total) by mouth daily. (Patient taking differently: Take 40 mg by mouth at bedtime.) 90 tablet 1   budesonide-formoterol (SYMBICORT) 160-4.5 MCG/ACT inhaler Inhale 2 puffs into the lungs 2 (two) times daily. 1 each 6   buPROPion (WELLBUTRIN XL) 300 MG 24 hr tablet Take 1 tablet by mouth once daily 90 tablet 0   Calcium Carb-Cholecalciferol (CALCIUM + VITAMIN D3 PO) Take 1-2 tablets by mouth See admin instructions. Take 1 tablet by mouth in the morning and 2 tablets by mouth at bedtime     cefdinir (OMNICEF) 300 MG capsule 1 cap po bid x 5d 10 capsule 0   cetirizine (ZYRTEC) 10 MG tablet Take 10 mg by mouth every evening.     guaiFENesin-dextromethorphan (ROBITUSSIN DM) 100-10 MG/5ML syrup Take 5 mLs by mouth every 4 (four) hours as needed for cough (chest congestion). 118 mL 0   ibandronate (BONIVA) 150 MG tablet Take 1 tablet (150 mg total) by mouth every 30 (thirty) days. Take in the morning with a full glass of water, on an empty stomach, and do not take anything else by mouth or lie down for the next 30 min. 3 tablet 3   insulin isophane & regular human KwikPen (HUMULIN 70/30 KWIKPEN) (70-30) 100 UNIT/ML KwikPen INJECT 4 UNITS SUBCUTANEOUSLY WITH BREAKFAST AND 2 UNITS WITH SUPPER (Patient taking differently: Inject 4 Units into the skin 2 (two) times daily.) 15 mL 1   Multiple Vitamins-Minerals (MULTIVITAMIN WOMEN PO) Take 1 tablet by mouth daily.     pantoprazole (PROTONIX) 40 MG tablet Take 1 tablet (40 mg total) by mouth daily. 90 tablet 1   Spacer/Aero-Holding Chambers (AEROCHAMBER MV) inhaler Use as instructed 1 each 2   No facility-administered medications prior to visit.    Allergies  Allergen Reactions   Lactose Intolerance (Gi) Diarrhea   Motrin [Ibuprofen]  Itching, Anxiety and Other (See Comments)    Anxiousness Hyperventilates     ROS As per HPI  PE:    09/05/2022    3:04 PM 09/02/2022   10:57 AM 08/18/2022    9:52 AM  Vitals with BMI  Height _0  5'  0" _0   Weight 138 lbs 3 oz 138 lbs 3 oz 140 lbs 3 oz  BMI 26.99 02.58 52.77  Systolic 824 235 361  Diastolic 68 70 66  Pulse 443 116 102     Physical Exam  General: Alert, interactive, does not appear acutely ill. Cardiovascular: Regular rhythm, rate 100-105, no murmur or rub. Lungs are clear to auscultation bilaterally, aeration is good on inspiration, mildly diminished diffusely on exhalation.  Mild prolongation of expiratory phase.   LABS:  Last CBC Lab Results  Component Value Date   WBC 7.4 09/02/2022   HGB 12.5 09/02/2022   HCT 38.1 09/02/2022   MCV 88.1 09/02/2022   MCH 28.3 08/13/2022   RDW 17.7 (H) 09/02/2022   PLT 212.0 15/40/0867   Last metabolic panel Lab Results  Component Value Date   GLUCOSE 335 (H) 09/02/2022   NA 135 09/02/2022   K 4.6 09/02/2022   CL 99 09/02/2022   CO2 27 09/02/2022   BUN 21 09/02/2022   CREATININE 1.25 (H) 09/02/2022   GFRNONAA >60 08/13/2022   CALCIUM 9.0 09/02/2022   PHOS 2.9 05/22/2022   PROT 5.9 (L) 08/11/2022   ALBUMIN 3.0 (L) 08/11/2022   LABGLOB 2.7 09/19/2018   AGRATIO 1.6 09/19/2018   BILITOT 0.5 08/11/2022   ALKPHOS 64 08/11/2022   AST 13 (L) 08/11/2022   ALT 13 08/11/2022   ANIONGAP 6 08/13/2022   Lab Results  Component Value Date   TSH 1.24 04/20/2017   IMPRESSION AND PLAN:  1) UTI, resolving approp. Finish omnicef.  2) Tachycardia, mild and getting better each visit. Unclear etiology. Question onset not long (1-2 wks) after we increased her wellbutrin to 300 mg qd (back on 08/04/22). Will dec back down to 145m dose and see how this goes.  3) AKI, recent.  Superimposed on CRI III: suspect d/t recent mild dehyd when ill. She is not on ACE-I, ARB, or diuretic.  An After Visit Summary was  printed and given to the patient.  FOLLOW UP: Return for 10-14d f/u tachycardia. Has routine follow-up already scheduled for 11/18/2022  Signed:  PCrissie Sickles MD           09/05/2022

## 2022-09-07 DIAGNOSIS — D631 Anemia in chronic kidney disease: Secondary | ICD-10-CM | POA: Diagnosis not present

## 2022-09-07 DIAGNOSIS — I5032 Chronic diastolic (congestive) heart failure: Secondary | ICD-10-CM | POA: Diagnosis not present

## 2022-09-07 DIAGNOSIS — I130 Hypertensive heart and chronic kidney disease with heart failure and stage 1 through stage 4 chronic kidney disease, or unspecified chronic kidney disease: Secondary | ICD-10-CM | POA: Diagnosis not present

## 2022-09-07 DIAGNOSIS — N1831 Chronic kidney disease, stage 3a: Secondary | ICD-10-CM | POA: Diagnosis not present

## 2022-09-07 DIAGNOSIS — E1122 Type 2 diabetes mellitus with diabetic chronic kidney disease: Secondary | ICD-10-CM | POA: Diagnosis not present

## 2022-09-07 DIAGNOSIS — J449 Chronic obstructive pulmonary disease, unspecified: Secondary | ICD-10-CM | POA: Diagnosis not present

## 2022-09-13 DIAGNOSIS — D631 Anemia in chronic kidney disease: Secondary | ICD-10-CM | POA: Diagnosis not present

## 2022-09-13 DIAGNOSIS — E1122 Type 2 diabetes mellitus with diabetic chronic kidney disease: Secondary | ICD-10-CM | POA: Diagnosis not present

## 2022-09-13 DIAGNOSIS — I130 Hypertensive heart and chronic kidney disease with heart failure and stage 1 through stage 4 chronic kidney disease, or unspecified chronic kidney disease: Secondary | ICD-10-CM | POA: Diagnosis not present

## 2022-09-13 DIAGNOSIS — N1831 Chronic kidney disease, stage 3a: Secondary | ICD-10-CM | POA: Diagnosis not present

## 2022-09-13 DIAGNOSIS — I5032 Chronic diastolic (congestive) heart failure: Secondary | ICD-10-CM | POA: Diagnosis not present

## 2022-09-13 DIAGNOSIS — J449 Chronic obstructive pulmonary disease, unspecified: Secondary | ICD-10-CM | POA: Diagnosis not present

## 2022-09-16 ENCOUNTER — Other Ambulatory Visit: Payer: Self-pay | Admitting: Family Medicine

## 2022-09-16 DIAGNOSIS — E118 Type 2 diabetes mellitus with unspecified complications: Secondary | ICD-10-CM

## 2022-09-19 ENCOUNTER — Ambulatory Visit: Payer: Medicare Other | Admitting: Family Medicine

## 2022-09-28 ENCOUNTER — Encounter: Payer: Self-pay | Admitting: *Deleted

## 2022-09-28 ENCOUNTER — Telehealth: Payer: Self-pay | Admitting: *Deleted

## 2022-09-28 NOTE — Patient Instructions (Signed)
Visit Information  Thank you for taking time to visit with me today. Please don't hesitate to contact me if I can be of assistance to you.   Following are the goals we discussed today:   Goals Addressed               This Visit's Progress     COMPLETED: No needs today (pt-stated)        Care Coordination Interventions: Reviewed medications with patient and discussed adherence with no needed refills Reviewed scheduled/upcoming provider appointments including sufficient transportation source Screening for signs and symptoms of depression related to chronic disease state  Assessed social determinant of health barriers          Please call the care guide team at [phone] if you need to cancel or reschedule your appointment.   If you are experiencing a Mental Health or [place] or need someone to talk to, please call the Suicide and Crisis Lifeline: 988  The patient verbalized understanding of instructions, educational materials, and care plan provided today and DECLINED offer to receive copy of patient instructions, educational materials, and care plan.   No further follow up required: No needs    Raina Mina, RN Care Management Coordinator [place] Office [phone]

## 2022-09-28 NOTE — Patient Outreach (Signed)
  Care Coordination   Initial Visit Note   09/28/2022 Name: [name] MRN: [id number] DOB: [birth year]  [name] is a 76 y.o. year old female who sees McGowen, [name], MD for primary care. I spoke with  [name] by phone today.  What matters to the patients health and wellness today?  No needs    Goals Addressed               This Visit's Progress     COMPLETED: No needs today (pt-stated)        Care Coordination Interventions: Reviewed medications with patient and discussed adherence with no needed refills Reviewed scheduled/upcoming provider appointments including sufficient transportation source Screening for signs and symptoms of depression related to chronic disease state  Assessed social determinant of health barriers          SDOH assessments and interventions completed:  Yes  SDOH Interventions Today    Flowsheet Row Most Recent Value  SDOH Interventions   Food Insecurity Interventions Intervention Not Indicated  Housing Interventions Intervention Not Indicated  Transportation Interventions Intervention Not Indicated  Utilities Interventions Intervention Not Indicated        Care Coordination Interventions:  Yes, provided   Follow up plan: No further intervention required.   Encounter Outcome:  Pt. Visit Completed   Raina Mina, RN Care Management Coordinator [place] Office [phone]

## 2022-09-30 ENCOUNTER — Encounter (INDEPENDENT_AMBULATORY_CARE_PROVIDER_SITE_OTHER): Payer: Medicare Other | Admitting: Ophthalmology

## 2022-09-30 ENCOUNTER — Ambulatory Visit: Payer: Medicare Other | Admitting: Family Medicine

## 2022-09-30 DIAGNOSIS — E113591 Type 2 diabetes mellitus with proliferative diabetic retinopathy without macular edema, right eye: Secondary | ICD-10-CM | POA: Diagnosis not present

## 2022-09-30 DIAGNOSIS — H35372 Puckering of macula, left eye: Secondary | ICD-10-CM | POA: Diagnosis not present

## 2022-09-30 DIAGNOSIS — H35033 Hypertensive retinopathy, bilateral: Secondary | ICD-10-CM | POA: Diagnosis not present

## 2022-09-30 DIAGNOSIS — I10 Essential (primary) hypertension: Secondary | ICD-10-CM | POA: Diagnosis not present

## 2022-09-30 DIAGNOSIS — E113512 Type 2 diabetes mellitus with proliferative diabetic retinopathy with macular edema, left eye: Secondary | ICD-10-CM

## 2022-09-30 DIAGNOSIS — H43813 Vitreous degeneration, bilateral: Secondary | ICD-10-CM | POA: Diagnosis not present

## 2022-10-03 ENCOUNTER — Encounter: Payer: Self-pay | Admitting: Family Medicine

## 2022-10-03 ENCOUNTER — Ambulatory Visit (INDEPENDENT_AMBULATORY_CARE_PROVIDER_SITE_OTHER): Payer: Medicare Other | Admitting: Family Medicine

## 2022-10-03 VITALS — BP 115/73 | HR 105 | Temp 97.5°F | Wt 140.8 lb

## 2022-10-03 DIAGNOSIS — R000 Tachycardia, unspecified: Secondary | ICD-10-CM | POA: Diagnosis not present

## 2022-10-03 NOTE — Progress Notes (Signed)
OFFICE VISIT  10/03/2022  CC:  Chief Complaint  Patient presents with   Tachycardia    Patient is a 76 y.o. female who presents for 1 month follow-up tachycardia. A/P as of last visit: "1) UTI, resolving approp. Finish omnicef.   2) Tachycardia, mild and getting better each visit. Unclear etiology. Question onset not long (1-2 wks) after we increased her wellbutrin to 300 mg qd (back on 08/04/22). Will dec back down to 159m dose and see how this goes.   3) AKI, recent.  Superimposed on CRI III: suspect d/t recent mild dehyd when ill. She is not on ACE-I, ARB, or diuretic."  INTERIM HX: [name] well.  No palpitations or sense of racing heart. No dizziness. Denies any acute urinary symptoms. No significant weight gain since last visit.  Past Medical History:  Diagnosis Date   Acute renal failure (HCC)    Age-related nuclear cataract of both eyes 2016   +cortical age related cataracts OU    Anxiety and depression    Bilateral diabetic retinopathy ([place] 2015   Dr. 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   Colonic diverticular abscess    COPD (chronic obstructive pulmonary disease) (HCC)    CVA (cerebral vascular accident) ([place] 09/2016   MRI did show a left-sided small ischemic stroke which was acute but likely incidental (MRI was done b/c pt had TIA sx's in hospital).  Neuro put her on plavix at that time.  Cardiology d/c'd her plavix 08/2017.   Diabetes mellitus with complication (HCC)    diab retinopathy OU (laser)   Diverticulitis of large intestine with perforation and abscess 09/11/2016   Finger fracture, right 04/2020   R 5th metacarpal fx at the base (Dr. LMardelle Matte   GERD (gastroesophageal reflux disease)    protonix   History of concussion 08/25/2018   w/out loss of  consciousness--mild increase in baseline memory impairment after.  CT head neg acute.   History of diverticulitis of colon    with abscess; required IR percutaneous drain placed 09/2016.   History of iron deficiency anemia    started iron 07/02/19.  EGD/colonoscopy 08/27/19 unrevealing. Capsule endoscopy considered but never done.   Hyperlipidemia    Hypertension    Left bundle branch block (LBBB) 12/16/2016   Lung cancer ([place]    non-small cell lung ca, stage III in 05/2011; systemic chemotherapy concurrent with radiation followed by prophylactic cranial irradiation and has been observation since July of 2010 with no evidence for disease recurrence-released from onc f/u 12/2014 (needs annual cxr by PCP).  CXR 08/2015 stable.  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 MEarlie Serverdid bx and this was NEG for malignancy.   Mild cognitive impairment with memory loss    Likely from brain radiation therapy   Non-obstructive CAD (coronary artery disease)    a. Cath 2006 preserved LV fxn, scattered irregularities without critical stenosis; b. 2008 stress echo negative for ischemia, but with hypertensive response; c. 08/2016 Cath: D1 25%, otw nl.   Normocytic anemia 2018   Iron, vit B12, folate all normal 12/2016.   Open-angle glaucoma 2016   Dr. BShirley Muscat (bilateral)---responding to topical therapy   Osteoarthritis of both hands    Osteoporosis 03/2016; 03/2018   03/2016 DEXA T-score -2.2.  03/2018 T-score L femoral neck  -2.7--boniva  started. DEXA 04/2021 T scor -2.6->boniva continued. Plan rpt DEXA 2 yrs.   Pneumonia    hospitalization 11/2016; hypoxemic resp failure--d/c'd home with home oxygen therapy   Pulmonary nodule, left 08/2021   LUL 11 mm ground glass, needs f/u CT 08/2022.   Seasonal allergic rhinitis    Subclinical hyperthyroidism 2018   Takotsubo cardiomyopathy    a. 08/2016 Echo: EF 30-35%, gr1DD, PASP 7mHg;  b. 08/2016 Cath: nonobs Dzs;  c. 11/2016 Echo: EF 40-45%, no rwma, nl RV fxn, mild TR, PASP  325mg.  ECHO 02/2017 EF 50-55%, grd II DD---essentially resolved Takotsubo 02/2017.   [place] de pointes ([place]   a. 08/2016 in setting of diverticulitis & pneumoperitoneum and Takotsubo CM -->prolonged QT, seen by EP-->avoid meds with potential for QT prolongation.    Past Surgical History:  Procedure Laterality Date   ABDOMINAL HYSTERECTOMY  1997   APPENDECTOMY     CARDIAC CATHETERIZATION N/A 09/14/2016   Procedure: Left Heart Cath and Coronary Angiography;  Surgeon: ThTroy SineMD;  [facility];  Service: Cardiovascular; Mild nonobstructive CAD with 85% smooth narrowing in the first diagonal branch of the LAD; 10% smooth narrowing of the ostial proximal left circumflex coronary artery; and a normal dominant RCA.   CARPAL TUNNEL RELEASE     CATARACT EXTRACTION Bilateral    CHOLECYSTECTOMY  2002   COLONOSCOPY  10/24/00; 08/27/19   2002 normal.  [employer] hemoccult testing via [facility] 06/18/15 was NEG.  2020->diverticulosis o/w nl.   dexa  03/2016; 03/2018   03/2018 T-score L femoral neck  -2.7 (worsened compared to osteopenic range 2017.) DEXA 05/03/21 stable T score -2.6 on boniva x 2 yrs->plan rpt DEXA 2 yrs.   ESOPHAGOGASTRODUODENOSCOPY  08/27/2019   Normal.   IR GENERIC HISTORICAL  09/19/2016   IR SINUS/FIST TUBE CHK-NON GI 09/19/2016 JaCorrie MckusickDO [facility]   IR GENERIC HISTORICAL  10/06/2016   IR RADIOLOGIST EVAL & MGMT 10/06/2016 [facility]   IR GENERIC HISTORICAL  10/26/2016   IR RADIOLOGIST EVAL & MGMT 10/26/2016 [facility]   IR GENERIC HISTORICAL  11/03/2016   IR RADIOLOGIST EVAL & MGMT 11/03/2016 WeArdis RowanPA-C [facility]   IR GENERIC HISTORICAL  11/24/2016   IR RADIOLOGIST EVAL & MGMT 11/24/2016 WeArdis RowanPA-C [facility]   IR GENERIC HISTORICAL  12/05/2016   Fistula smaller/improving.  IR SINUS/FIST TUBE CHK-NON GI 12/05/2016 JoSandi MariscalMD [facility]   IR GENERIC HISTORICAL  12/22/2016   IR  RADIOLOGIST EVAL & MGMT 12/22/2016 MiGreggory KeenMD [facility]   IR RADIOLOGIST EVAL & MGMT  01/10/2017   IR RADIOLOGIST EVAL & MGMT  02/09/2017   IR SINUS/FIST TUBE CHK-NON GI  03/28/2017   LEFT HEART CATHETERIZATION WITH CORONARY ANGIOGRAM N/A 05/30/2012   Procedure: LEFT HEART CATHETERIZATION WITH CORONARY ANGIOGRAM;  Surgeon: ChBurnell BlanksMD;  [facility];  Service: Cardiovascular: Mild, non-obstructive CAD   TRANSTHORACIC ECHOCARDIOGRAM  09/14/2016; 11/2016   08/2016: EF 30-35 %, Akinesis of the mid-apicalanteroseptal myocardium.  Grade I DD.  Mild pulm HTN.  Repeat echo 11/2016, EF 40-45%, no rwma, nl RV fxn, mild TR, PASP 3749m.     TRANSTHORACIC ECHOCARDIOGRAM  03/07/2017   EF 50-55%. Hypokinesis of the distal septum with overall low normal LV,  systolic function; mild diastolic dysfunction with elevated LV  filling pressure; mildly calcified aortic valve with mild AI; small pericardial effusion    Outpatient  Medications Prior to Visit  Medication Sig Dispense Refill   acetaminophen (TYLENOL) 500 MG tablet Take 1,000 mg by mouth daily as needed for headache.     albuterol (PROVENTIL) (2.5 MG/3ML) 0.083% nebulizer solution USE 1 VIAL IN NEBULIZER EVERY 4 HOURS AS NEEDED FOR WHEEZING AND FOR SHORTNESS OF BREATH 90 mL 1   albuterol (VENTOLIN HFA) 108 (90 Base) MCG/ACT inhaler INHALE 2 PUFFS BY MOUTH EVERY 6 HOURS AS NEEDED FOR WHEEZING OR SHORTNESS OF BREATH 18 g 1   aspirin 81 MG EC tablet Take 81 mg by mouth daily.      atorvastatin (LIPITOR) 40 MG tablet Take 1 tablet (40 mg total) by mouth daily. (Patient taking differently: Take 40 mg by mouth at bedtime.) 90 tablet 1   budesonide-formoterol (SYMBICORT) 160-4.5 MCG/ACT inhaler Inhale 2 puffs into the lungs 2 (two) times daily. 1 each 6   buPROPion (WELLBUTRIN XL) 300 MG 24 hr tablet Take 1 tablet by mouth once daily 90 tablet 0   Calcium Carb-Cholecalciferol (CALCIUM + VITAMIN D3 PO) Take 1-2 tablets by mouth  See admin instructions. Take 1 tablet by mouth in the morning and 2 tablets by mouth at bedtime     cefdinir (OMNICEF) 300 MG capsule 1 cap po bid x 5d 10 capsule 0   cetirizine (ZYRTEC) 10 MG tablet Take 10 mg by mouth every evening.     guaiFENesin-dextromethorphan (ROBITUSSIN DM) 100-10 MG/5ML syrup Take 5 mLs by mouth every 4 (four) hours as needed for cough (chest congestion). 118 mL 0   ibandronate (BONIVA) 150 MG tablet Take 1 tablet (150 mg total) by mouth every 30 (thirty) days. Take in the morning with a full glass of water, on an empty stomach, and do not take anything else by mouth or lie down for the next 30 min. 3 tablet 3   insulin isophane & regular human KwikPen (HUMULIN 70/30 KWIKPEN) (70-30) 100 UNIT/ML KwikPen INJECT 4 UNITS SUBCUTANEOUSLY WITH BREAKFAST AND 2 UNITS WITH SUPPER (Patient taking differently: Inject 4 Units into the skin 2 (two) times daily.) 15 mL 1   Multiple Vitamins-Minerals (MULTIVITAMIN WOMEN PO) Take 1 tablet by mouth daily.     pantoprazole (PROTONIX) 40 MG tablet Take 1 tablet (40 mg total) by mouth daily. 90 tablet 1   Spacer/Aero-Holding Chambers (AEROCHAMBER MV) inhaler Use as instructed 1 each 2   No facility-administered medications prior to visit.    Allergies  Allergen Reactions   Lactose Intolerance (Gi) Diarrhea   Motrin [Ibuprofen] Itching, Anxiety and Other (See Comments)    Anxiousness Hyperventilates     ROS As per HPI  PE:    10/03/2022    3:26 PM 09/05/2022    3:04 PM 09/02/2022   10:57 AM  Vitals with BMI  Height  _0  _1   Weight 140 lbs 13 oz 138 lbs 3 oz 138 lbs 3 oz  BMI 27.5 67.89 38.10  Systolic 175 102 585  Diastolic 73 68 70  Pulse 277 107 116   Physical Exam  Gen: Alert, well appearing.  Patient is oriented to person, place, time, and situation. Regular rhythm, mild tachy to 100-105 range, no murmur Extremities: No edema  LABS:  Last CBC Lab Results  Component Value Date   WBC 7.4 09/02/2022   HGB  12.5 09/02/2022   HCT 38.1 09/02/2022   MCV 88.1 09/02/2022   MCH 28.3 08/13/2022   RDW 17.7 (H) 09/02/2022   PLT 212.0 82/42/3536   Last metabolic panel  Lab Results  Component Value Date   GLUCOSE 335 (H) 09/02/2022   NA 135 09/02/2022   K 4.6 09/02/2022   CL 99 09/02/2022   CO2 27 09/02/2022   BUN 21 09/02/2022   CREATININE 1.25 (H) 09/02/2022   GFRNONAA >60 08/13/2022   CALCIUM 9.0 09/02/2022   PHOS 2.9 05/22/2022   PROT 5.9 (L) 08/11/2022   ALBUMIN 3.0 (L) 08/11/2022   LABGLOB 2.7 09/19/2018   AGRATIO 1.6 09/19/2018   BILITOT 0.5 08/11/2022   ALKPHOS 64 08/11/2022   AST 13 (L) 08/11/2022   ALT 13 08/11/2022   ANIONGAP 6 08/13/2022   Last hemoglobin A1c Lab Results  Component Value Date   HGBA1C 7.3 (A) 08/04/2022   HGBA1C 7.3 08/04/2022   HGBA1C 7.3 (A) 08/04/2022   HGBA1C 7.3 (A) 08/04/2022   Lab Results  Component Value Date   TSH 1.24 04/20/2017   IMPRESSION AND PLAN:  Tachycardia, unknown etiology. Asymptomatic. No change with decrease of Wellbutrin to 150 mg a day. Will increase Wellbutrin XL back to 300 mg a day. Check TSH today.  An After Visit Summary was printed and given to the patient.  FOLLOW UP: No follow-ups on file.  Signed:  Crissie Sickles, MD           10/03/2022

## 2022-10-03 NOTE — Patient Instructions (Signed)
Resume 300mg  daily dose of wellbutrin (bupropion)

## 2022-10-04 ENCOUNTER — Other Ambulatory Visit: Payer: Self-pay | Admitting: Family Medicine

## 2022-10-04 LAB — TSH: TSH: 1.3 u[IU]/mL (ref 0.35–5.50)

## 2022-10-18 ENCOUNTER — Emergency Department (HOSPITAL_COMMUNITY): Payer: Medicare Other

## 2022-10-18 ENCOUNTER — Emergency Department (HOSPITAL_COMMUNITY)
Admission: EM | Admit: 2022-10-18 | Discharge: 2022-10-18 | Disposition: A | Discharge status: Home / Self Care | Payer: Medicare Other | Attending: Emergency Medicine | Admitting: Emergency Medicine

## 2022-10-18 ENCOUNTER — Other Ambulatory Visit: Payer: Self-pay

## 2022-10-18 DIAGNOSIS — R918 Other nonspecific abnormal finding of lung field: Secondary | ICD-10-CM | POA: Diagnosis not present

## 2022-10-18 DIAGNOSIS — R34 Anuria and oliguria: Secondary | ICD-10-CM | POA: Insufficient documentation

## 2022-10-18 DIAGNOSIS — Z85118 Personal history of other malignant neoplasm of bronchus and lung: Secondary | ICD-10-CM | POA: Insufficient documentation

## 2022-10-18 DIAGNOSIS — F0390 Unspecified dementia without behavioral disturbance: Secondary | ICD-10-CM | POA: Diagnosis not present

## 2022-10-18 DIAGNOSIS — R059 Cough, unspecified: Secondary | ICD-10-CM | POA: Diagnosis not present

## 2022-10-18 DIAGNOSIS — Z743 Need for continuous supervision: Secondary | ICD-10-CM | POA: Diagnosis not present

## 2022-10-18 DIAGNOSIS — I447 Left bundle-branch block, unspecified: Secondary | ICD-10-CM | POA: Diagnosis not present

## 2022-10-18 DIAGNOSIS — Z20822 Contact with and (suspected) exposure to covid-19: Secondary | ICD-10-CM | POA: Diagnosis not present

## 2022-10-18 DIAGNOSIS — Z7982 Long term (current) use of aspirin: Secondary | ICD-10-CM | POA: Diagnosis not present

## 2022-10-18 DIAGNOSIS — I499 Cardiac arrhythmia, unspecified: Secondary | ICD-10-CM | POA: Diagnosis not present

## 2022-10-18 DIAGNOSIS — R6889 Other general symptoms and signs: Secondary | ICD-10-CM | POA: Diagnosis not present

## 2022-10-18 DIAGNOSIS — R5381 Other malaise: Secondary | ICD-10-CM | POA: Insufficient documentation

## 2022-10-18 DIAGNOSIS — I509 Heart failure, unspecified: Secondary | ICD-10-CM | POA: Insufficient documentation

## 2022-10-18 DIAGNOSIS — Z794 Long term (current) use of insulin: Secondary | ICD-10-CM | POA: Insufficient documentation

## 2022-10-18 DIAGNOSIS — E860 Dehydration: Secondary | ICD-10-CM | POA: Diagnosis not present

## 2022-10-18 DIAGNOSIS — Z9889 Other specified postprocedural states: Secondary | ICD-10-CM | POA: Diagnosis not present

## 2022-10-18 DIAGNOSIS — R531 Weakness: Secondary | ICD-10-CM | POA: Diagnosis not present

## 2022-10-18 LAB — CBC WITH DIFFERENTIAL/PLATELET
Abs Immature Granulocytes: 0.06 10*3/uL (ref 0.00–0.07)
Basophils Absolute: 0 10*3/uL (ref 0.0–0.1)
Basophils Relative: 0 %
Eosinophils Absolute: 0 10*3/uL (ref 0.0–0.5)
Eosinophils Relative: 0 %
HCT: 36.8 % (ref 36.0–46.0)
Hemoglobin: 11.8 g/dL — ABNORMAL LOW (ref 12.0–15.0)
Immature Granulocytes: 0 %
Lymphocytes Relative: 6 %
Lymphs Abs: 0.9 10*3/uL (ref 0.7–4.0)
MCH: 28.6 pg (ref 26.0–34.0)
MCHC: 32.1 g/dL (ref 30.0–36.0)
MCV: 89.1 fL (ref 80.0–100.0)
Monocytes Absolute: 1 10*3/uL (ref 0.1–1.0)
Monocytes Relative: 7 %
Neutro Abs: 13.2 10*3/uL — ABNORMAL HIGH (ref 1.7–7.7)
Neutrophils Relative %: 87 %
Platelets: 185 10*3/uL (ref 150–400)
RBC: 4.13 MIL/uL (ref 3.87–5.11)
RDW: 15.4 % (ref 11.5–15.5)
WBC: 15.2 10*3/uL — ABNORMAL HIGH (ref 4.0–10.5)
nRBC: 0 % (ref 0.0–0.2)

## 2022-10-18 LAB — COMPREHENSIVE METABOLIC PANEL
ALT: 12 U/L (ref 0–44)
AST: 17 U/L (ref 15–41)
Albumin: 3.2 g/dL — ABNORMAL LOW (ref 3.5–5.0)
Alkaline Phosphatase: 59 U/L (ref 38–126)
Anion gap: 6 (ref 5–15)
BUN: 18 mg/dL (ref 8–23)
CO2: 26 mmol/L (ref 22–32)
Calcium: 8.8 mg/dL — ABNORMAL LOW (ref 8.9–10.3)
Chloride: 105 mmol/L (ref 98–111)
Creatinine, Ser: 1.05 mg/dL — ABNORMAL HIGH (ref 0.44–1.00)
GFR, Estimated: 56 mL/min — ABNORMAL LOW (ref >60)
Glucose, Bld: 198 mg/dL — ABNORMAL HIGH (ref 70–99)
Potassium: 4.7 mmol/L (ref 3.5–5.1)
Sodium: 137 mmol/L (ref 135–145)
Total Bilirubin: 0.6 mg/dL (ref 0.3–1.2)
Total Protein: 6.1 g/dL — ABNORMAL LOW (ref 6.5–8.1)

## 2022-10-18 LAB — URINALYSIS, ROUTINE W REFLEX MICROSCOPIC
Bacteria, UA: NONE SEEN
Bilirubin Urine: NEGATIVE
Glucose, UA: 100 mg/dL — AB
Ketones, ur: NEGATIVE mg/dL
Leukocytes,Ua: NEGATIVE
Nitrite: NEGATIVE
Protein, ur: NEGATIVE mg/dL
Specific Gravity, Urine: 1.01 (ref 1.005–1.030)
pH: 7 (ref 5.0–8.0)

## 2022-10-18 LAB — PROTIME-INR
INR: 1.2 (ref 0.8–1.2)
Prothrombin Time: 14.7 seconds (ref 11.4–15.2)

## 2022-10-18 LAB — RESP PANEL BY RT-PCR (RSV, FLU A&B, COVID)  RVPGX2
Influenza A by PCR: NEGATIVE
Influenza B by PCR: NEGATIVE
Resp Syncytial Virus by PCR: NEGATIVE
SARS Coronavirus 2 by RT PCR: NEGATIVE

## 2022-10-18 LAB — LACTIC ACID, PLASMA: Lactic Acid, Venous: 1.7 mmol/L (ref 0.5–1.9)

## 2022-10-18 LAB — APTT: aPTT: 29 seconds (ref 24–36)

## 2022-10-18 MED ORDER — LACTATED RINGERS IV SOLN: INTRAVENOUS | Status: DC

## 2022-10-18 MED ORDER — LACTATED RINGERS IV BOLUS (SEPSIS)
1000.0000 mL | Freq: Once | INTRAVENOUS | Status: AC
  Administered 2022-10-18: 1000 mL via INTRAVENOUS

## 2022-10-18 MED ORDER — LACTATED RINGERS IV BOLUS (SEPSIS)
500.0000 mL | Freq: Once | INTRAVENOUS | Status: AC
  Administered 2022-10-18: 500 mL via INTRAVENOUS

## 2022-10-18 NOTE — ED Provider Notes (Signed)
Received patient in turnover from Dr. Tomi Bamberger.  Please see their note for further details of Hx, PE.  Briefly patient is a 76 y.o. female with a Weakness .  The patient has no specific complaints.  By history it sounds like she has been eating and drinking well.  She is able to eat and drink here without issue.  Was tachycardic on arrival was given a bolus of IV fluids and started on infusion.  Lab work without obvious concern.  Awaiting UA.  UA is negative for infection.  Patient tells me she has no complaints would like to go home.  PCP follow-up.    Deno Etienne, DO 10/18/22 1912

## 2022-10-18 NOTE — ED Notes (Signed)
Re-sent labs for processing

## 2022-10-18 NOTE — Discharge Instructions (Signed)
Please follow-up with your family doctor in the office.  Please return for worsening symptoms inability eat or drink abdominal pain fever.

## 2022-10-18 NOTE — ED Notes (Signed)
Called pt's daughter to inform of discharge; she will come pick her up shortly.

## 2022-10-18 NOTE — ED Triage Notes (Signed)
Pt via EMS from home c/o weakness and SOB x 2 days with potential dehydration. Pt's son states she presents at baseline but is mostly unable to express her needs. Decreased urine output per family. Pt has hx dementia, right lung CA, and CHF with recurrent UTIs. EMS states right lung sounds are diffusely rhonchorous. EKG LBBB; EMS inserted IV en route but it infiltrated prior to arrival.   BP 100/60 HR 124 O2 99% RA RR 36 CBG 207

## 2022-10-18 NOTE — ED Provider Notes (Signed)
[facility] DEPT Provider Note   CSN: [id number] Arrival date & time: 10/18/22  1141     History  Chief complaint: Cough congestion, weakness, malaise  [name] is a 76 y.o. female.  HPI   Patient has history of dementia lung cancer CHF recurrent UTIs.  Family noticed the patient has been having weakness the last couple days.  She has not been eating or drinking as well.  She has been coughing a lot and seems somewhat short of breath.  Family noted she is also had decreased urine output lately so they called and asked to bring her to the hospital for evaluation.  Patient herself denies any specific complaints.  She is not having any fevers.  She denies any chest pain.  No abdominal pain.  She does complain of a cough but cannot tell me exactly how long it has been going on for  Home Medications Prior to Admission medications   Medication Sig Start Date End Date Taking? Authorizing Provider  acetaminophen (TYLENOL) 500 MG tablet Take 1,000 mg by mouth daily as needed for headache.    [provider]  albuterol (PROVENTIL) (2.5 MG/3ML) 0.083% nebulizer solution USE 1 VIAL IN NEBULIZER EVERY 4 HOURS AS NEEDED FOR WHEEZING AND FOR SHORTNESS OF BREATH 02/12/21   McGowen, [name], MD  albuterol (VENTOLIN HFA) 108 (90 Base) MCG/ACT inhaler INHALE 2 PUFFS BY MOUTH EVERY 6 HOURS AS NEEDED FOR WHEEZING OR SHORTNESS OF BREATH 06/05/20   McGowen, [name], MD  aspirin 81 MG EC tablet Take 81 mg by mouth daily.     [provider]  atorvastatin (LIPITOR) 40 MG tablet Take 1 tablet (40 mg total) by mouth daily. Patient taking differently: Take 40 mg by mouth at bedtime. 08/04/22   McGowen, [name], MD  budesonide-formoterol (SYMBICORT) 160-4.5 MCG/ACT inhaler Inhale 2 puffs into the lungs 2 (two) times daily. 09/02/22   [name], MD  buPROPion (WELLBUTRIN XL) 300 MG 24 hr tablet Take 1 tablet by mouth once daily 08/24/22   McGowen,  [name], MD  Calcium Carb-Cholecalciferol (CALCIUM + VITAMIN D3 PO) Take 1-2 tablets by mouth See admin instructions. Take 1 tablet by mouth in the morning and 2 tablets by mouth at bedtime    [provider]  cefdinir (OMNICEF) 300 MG capsule 1 cap po bid x 5d 09/02/22   McGowen, [name], MD  cetirizine (ZYRTEC) 10 MG tablet Take 10 mg by mouth every evening.    [provider]  guaiFENesin-dextromethorphan (ROBITUSSIN DM) 100-10 MG/5ML syrup Take 5 mLs by mouth every 4 (four) hours as needed for cough (chest congestion). 08/13/22   Pokhrel, Corrie Mckusick, MD  ibandronate (BONIVA) 150 MG tablet Take 1 tablet (150 mg total) by mouth every 30 (thirty) days. Take in the morning with a full glass of water, on an empty stomach, and do not take anything else by mouth or lie down for the next 30 min. 08/04/22   McGowen, [name], MD  insulin isophane & regular human KwikPen (HUMULIN 70/30 KWIKPEN) (70-30) 100 UNIT/ML KwikPen INJECT 4 UNITS SUBCUTANEOUSLY WITH BREAKFAST AND 2 UNITS WITH SUPPER Patient taking differently: Inject 4 Units into the skin 2 (two) times daily. 02/02/22   McGowen, [name], MD  Multiple Vitamins-Minerals (MULTIVITAMIN WOMEN PO) Take 1 tablet by mouth daily.    [provider]  pantoprazole (PROTONIX) 40 MG tablet Take 1 tablet (40 mg total) by mouth daily. 08/04/22   Shawnie Dapper  H, MD  Spacer/Aero-Holding Chambers (AEROCHAMBER MV) inhaler Use as instructed 08/04/22   McGowen, [name], MD      Allergies    Lactose intolerance (gi) and Motrin [ibuprofen]    Review of Systems   Review of Systems  Physical Exam Updated Vital Signs BP 103/64 (BP Location: Right Arm)   Pulse (!) 114   Temp 98 F (36.7 C) (Oral)   Resp 18   Ht 1.524 m (5')   Wt 63.5 kg   SpO2 95%   BMI 27.34 kg/m  Physical Exam Vitals and nursing note reviewed.  Constitutional:      Appearance: She is well-developed. She is not diaphoretic.  HENT:     Head: Normocephalic and  atraumatic.     Right Ear: External ear normal.     Left Ear: External ear normal.  Eyes:     General: No scleral icterus.       Right eye: No discharge.        Left eye: No discharge.     Conjunctiva/sclera: Conjunctivae normal.  Neck:     Trachea: No tracheal deviation.  Cardiovascular:     Rate and Rhythm: Normal rate and regular rhythm.  Pulmonary:     Effort: Pulmonary effort is normal. No respiratory distress.     Breath sounds: No stridor. Rhonchi present. No wheezing or rales.     Comments: Frequent coughing Abdominal:     General: Bowel sounds are normal. There is no distension.     Palpations: Abdomen is soft.     Tenderness: There is no abdominal tenderness. There is no guarding or rebound.  Musculoskeletal:        General: No tenderness or deformity.     Cervical back: Neck supple.  Skin:    General: Skin is warm and dry.     Findings: No rash.  Neurological:     General: No focal deficit present.     Mental Status: She is alert.     Cranial Nerves: No cranial nerve deficit, dysarthria or facial asymmetry.     Sensory: No sensory deficit.     Motor: No abnormal muscle tone or seizure activity.     Coordination: Coordination normal.     Comments: Patient is able to tell me her name and her location, unaware of the year  Psychiatric:        Mood and Affect: Mood normal.     ED Results / Procedures / Treatments   Labs (all labs ordered are listed, but only abnormal results are displayed) Labs Reviewed  CBC WITH DIFFERENTIAL/PLATELET - Abnormal; Notable for the following components:      Result Value   WBC 15.2 (*)    Hemoglobin 11.8 (*)    Neutro Abs 13.2 (*)    All other components within normal limits  RESP PANEL BY RT-PCR (RSV, FLU A&B, COVID)  [id number]  CULTURE, BLOOD (ROUTINE X 2)  CULTURE, BLOOD (ROUTINE X 2)  LACTIC ACID, PLASMA  LACTIC ACID, PLASMA  COMPREHENSIVE METABOLIC PANEL  PROTIME-INR  APTT  URINALYSIS, ROUTINE W REFLEX MICROSCOPIC     EKG EKG Interpretation  Date/Time:  Tuesday October 18 2022 12:47:16 EST Ventricular Rate:  118 PR Interval:  144 QRS Duration: 130 QT Interval:  368 QTC Calculation: 515 R Axis:   102 Text Interpretation: Sinus tachycardia Rightward axis Non-specific intra-ventricular conduction block Cannot rule out Anterior infarct , age undetermined Abnormal ECG When compared with ECG of 11-Aug-2022 11:30, Since last tracing  rate faster Confirmed by Dorie Rank [phone]) on 10/18/2022 12:51:20 PM  Radiology DG Chest Port 1 View  Result Date: 10/18/2022 CLINICAL DATA:  Questionable sepsis EXAM: PORTABLE CHEST 1 VIEW COMPARISON:  August 10, 2022 FINDINGS: Post therapeutic changes in the right suprahilar region and apex consistent with treated lung cancer. The cardiomediastinal silhouette is stable. No pneumothorax. No focal infiltrates to suggest pneumonia or aspiration. No other acute abnormalities. IMPRESSION: Post therapeutic changes in the right suprahilar region and apex. No acute abnormalities. Electronically Signed   By: Dorise Bullion III M.D.   On: 10/18/2022 12:58    Procedures Procedures    Medications Ordered in ED Medications  lactated ringers infusion ( Intravenous New Bag/Given 10/18/22 1402)  lactated ringers bolus 500 mL (0 mLs Intravenous Stopped 10/18/22 1402)    ED Course/ Medical Decision Making/ A&P Clinical Course as of 10/18/22 1517  Tue Oct 18, 2022  1430 Resp panel by RT-PCR (RSV, Flu A&B, Covid) Anterior Nasal Swab Covid flu are negative [JK]  1430 Chest x-ray without acute abnormalities. [JK]    Clinical Course User Index [JK] Dorie Rank, MD                           Medical Decision Making Amount and/or Complexity of Data Reviewed Labs: ordered. Decision-making details documented in ED Course. Radiology: ordered. ECG/medicine tests: ordered.  Risk Prescription drug management.   Patient presented to the ED for evaluation of weakness decreased urine  output.  Patient herself without any specific complaints.  However she has noted to be tachycardic and does appear mildly dehydrated.  The fluid resuscitation ordered.  Initial labs do show leukocytosis but her metabolic panel lactic acid and urinalysis are  still pending.  No evidence of pneumonia.  COVID and flu are negative.  Care turned over to Dr. Tyrone Nine at shift change        Final Clinical Impression(s) / ED Diagnoses pending     Dorie Rank, MD 10/18/22 1517

## 2022-10-20 ENCOUNTER — Encounter: Payer: Self-pay | Admitting: Family Medicine

## 2022-10-20 NOTE — Progress Notes (Signed)
OFFICE VISIT  10/21/2022  CC: Weakness, follow-up ED visit  Patient is a 76 y.o. female who presents accompanied by her daughter [name] for generalized weakness.  HPI: 6 days ago [name] began to complain of significant generalized weakness.  Found it difficult to get up out of her chair and walk around.  Both legs felt the same. She denies any focal weakness, no paresthesias, no altered mental status. Denies abdominal pain, nausea, side pain, or back pain.  Denies dysuria.  She does urinate frequently in the nighttime, sleeps in depends.  No fevers. Denies chest pain, shortness of breath, headaches, or dizziness.  This morning when being helped out of her chair she said she felt like her heart was racing.  Otherwise no palpitations or sensation that heart is racing.  No arm pain or jaw pain.  She has had similar presentation in the past when she had urinary tract infection. She does not typically have classic UTI symptoms. Daughter notes that her urine does smell abnormal the last couple of days. She does note that its sugars have been higher lately--up into the 170s at times.  I reviewed her entire ED record from 10/18/2022. Her heart rate was a bit elevated into the 115 -120 range on her exam as well as on EKG. EKG showed regular rhythm with nonspecific intraventricular conduction delay.  She does have a history of left bundle branch block. Her urinalysis in the ED was normal.  White blood cell count was 15.2 thousand, hemoglobin 11.8. Glucose was 198, serum creatinine 1.05.  Remainder of metabolic panel was normal.  Lactic acid level normal. Blood cultures x 2 negative x 3 days.  Chest x-ray showed no acute abnormality. She was given IV fluids in the ER and discharged home.   Past Medical History:  Diagnosis Date   Acute renal failure (HCC)    Age-related nuclear cataract of both eyes 2016   +cortical age related cataracts OU    Anxiety and depression    Bilateral diabetic  retinopathy ([place]) 2015   Dr. 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   Colonic diverticular abscess    COPD (chronic obstructive pulmonary disease) (HCC)    CVA (cerebral vascular accident) ([place]) 09/2016   MRI did show a left-sided small ischemic stroke which was acute but likely incidental (MRI was done b/c pt had TIA sx's in hospital).  Neuro put her on plavix at that time.  Cardiology d/c'd her plavix 08/2017.   Diabetes mellitus with complication (HCC)    diab retinopathy OU (laser)   Diverticulitis of large intestine with perforation and abscess 09/11/2016   Finger fracture, right 04/2020   R 5th metacarpal fx at the base (Dr. Mardelle Matte)   GERD (gastroesophageal reflux disease)    protonix   History of concussion 08/25/2018   w/out loss of consciousness--mild increase in baseline memory impairment after.  CT head neg acute.   History of diverticulitis of colon    with abscess; required IR percutaneous drain placed 09/2016.   History of iron deficiency anemia    started iron 07/02/19.  EGD/colonoscopy 08/27/19 unrevealing. Capsule endoscopy considered but never done.   Hyperlipidemia    Hypertension    Left bundle branch block (LBBB) 12/16/2016   Lung cancer (HCC)    non-small cell lung  ca, stage III in 05/2011; systemic chemotherapy concurrent with radiation followed by prophylactic cranial irradiation and has been observation since July of 2010 with no evidence for disease recurrence-released from onc f/u 12/2014 (needs annual cxr by PCP).  CXR 08/2015 stable.  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 Earlie Server did bx and this was NEG for malignancy.   Mild cognitive impairment with memory loss    Likely from brain radiation therapy   Non-obstructive CAD (coronary artery disease)    a. Cath 2006 preserved LV  fxn, scattered irregularities without critical stenosis; b. 2008 stress echo negative for ischemia, but with hypertensive response; c. 08/2016 Cath: D1 25%, otw nl.   Normocytic anemia 2018   Iron, vit B12, folate all normal 12/2016.   Open-angle glaucoma 2016   Dr. Shirley Muscat, (bilateral)---responding to topical therapy   Osteoarthritis of both hands    Osteoporosis 03/2016; 03/2018   03/2016 DEXA T-score -2.2.  03/2018 T-score L femoral neck  -2.7--boniva started. DEXA 04/2021 T scor -2.6->boniva continued. Plan rpt DEXA 2 yrs.   Pneumonia    hospitalization 11/2016; hypoxemic resp failure--d/c'd home with home oxygen therapy   Pulmonary nodule, left 08/2021   LUL 11 mm ground glass, needs f/u CT 08/2022.   Seasonal allergic rhinitis    Subclinical hyperthyroidism 2018   Takotsubo cardiomyopathy    a. 08/2016 Echo: EF 30-35%, gr1DD, PASP 65mHg;  b. 08/2016 Cath: nonobs Dzs;  c. 11/2016 Echo: EF 40-45%, no rwma, nl RV fxn, mild TR, PASP 325mg.  ECHO 02/2017 EF 50-55%, grd II DD---essentially resolved Takotsubo 02/2017.   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 08/2016 in setting of diverticulitis & pneumoperitoneum and Takotsubo CM -->prolonged QT, seen by EP-->avoid meds with potential for QT prolongation.    Past Surgical History:  Procedure Laterality Date   ABDOMINAL HYSTERECTOMY  1997   APPENDECTOMY     CARDIAC CATHETERIZATION N/A 09/14/2016   Procedure: Left Heart Cath and Coronary Angiography;  Surgeon: ThTroy SineMD;  [facility];  Service: Cardiovascular; Mild nonobstructive CAD with 85% smooth narrowing in the first diagonal branch of the LAD; 10% smooth narrowing of the ostial proximal left circumflex coronary artery; and a normal dominant RCA.   CARPAL TUNNEL RELEASE     CATARACT EXTRACTION Bilateral    CHOLECYSTECTOMY  2002   COLONOSCOPY  10/24/00; 08/27/19   2002 normal.  [employer] hemoccult testing via [facility] 06/18/15 was NEG.  2020->diverticulosis o/w nl.    dexa  03/2016; 03/2018   03/2018 T-score L femoral neck  -2.7 (worsened compared to osteopenic range 2017.) DEXA 05/03/21 stable T score -2.6 on boniva x 2 yrs->plan rpt DEXA 2 yrs.   ESOPHAGOGASTRODUODENOSCOPY  08/27/2019   Normal.   IR GENERIC HISTORICAL  09/19/2016   IR SINUS/FIST TUBE CHK-NON GI 09/19/2016 JaCorrie MckusickDO [facility]   IR GENERIC HISTORICAL  10/06/2016   IR RADIOLOGIST EVAL & MGMT 10/06/2016 [facility]   IR GENERIC HISTORICAL  10/26/2016   IR RADIOLOGIST EVAL & MGMT 10/26/2016 [facility]   IR GENERIC HISTORICAL  11/03/2016   IR RADIOLOGIST EVAL & MGMT 11/03/2016 WeArdis RowanPA-C [facility]   IR GENERIC HISTORICAL  11/24/2016   IR RADIOLOGIST EVAL & MGMT 11/24/2016 WeArdis RowanPA-C [facility]   IR GENERIC HISTORICAL  12/05/2016   Fistula smaller/improving.  IR SINUS/FIST TUBE CHK-NON GI 12/05/2016 JoSandi MariscalMD [facility]   IR GENERIC HISTORICAL  12/22/2016  IR RADIOLOGIST EVAL & MGMT 12/22/2016 Greggory Keen, MD [facility]   IR RADIOLOGIST EVAL & MGMT  01/10/2017   IR RADIOLOGIST EVAL & MGMT  02/09/2017   IR SINUS/FIST TUBE CHK-NON GI  03/28/2017   LEFT HEART CATHETERIZATION WITH CORONARY ANGIOGRAM N/A 05/30/2012   Procedure: LEFT HEART CATHETERIZATION WITH CORONARY ANGIOGRAM;  Surgeon: Burnell Blanks, MD;  Location: [facility];  Service: Cardiovascular: Mild, non-obstructive CAD   TRANSTHORACIC ECHOCARDIOGRAM  09/14/2016; 11/2016   08/2016: EF 30-35 %, Akinesis of the mid-apicalanteroseptal myocardium.  Grade I DD.  Mild pulm HTN.  Repeat echo 11/2016, EF 40-45%, no rwma, nl RV fxn, mild TR, PASP 43mHg.     TRANSTHORACIC ECHOCARDIOGRAM  03/07/2017   EF 50-55%. Hypokinesis of the distal septum with overall low normal LV,  systolic function; mild diastolic dysfunction with elevated LV  filling pressure; mildly calcified aortic valve with mild AI; small pericardial effusion   TRANSTHORACIC ECHOCARDIOGRAM      04/2022 EF 60-65%, no WMA, no valve probs    Outpatient Medications Prior to Visit  Medication Sig Dispense Refill   acetaminophen (TYLENOL) 500 MG tablet Take 1,000 mg by mouth daily as needed for headache.     albuterol (PROVENTIL) (2.5 MG/3ML) 0.083% nebulizer solution USE 1 VIAL IN NEBULIZER EVERY 4 HOURS AS NEEDED FOR WHEEZING AND FOR SHORTNESS OF BREATH 90 mL 1   albuterol (VENTOLIN HFA) 108 (90 Base) MCG/ACT inhaler INHALE 2 PUFFS BY MOUTH EVERY 6 HOURS AS NEEDED FOR WHEEZING OR SHORTNESS OF BREATH 18 g 1   aspirin 81 MG EC tablet Take 81 mg by mouth daily.      atorvastatin (LIPITOR) 40 MG tablet Take 1 tablet (40 mg total) by mouth daily. (Patient taking differently: Take 40 mg by mouth at bedtime.) 90 tablet 1   budesonide-formoterol (SYMBICORT) 160-4.5 MCG/ACT inhaler Inhale 2 puffs into the lungs 2 (two) times daily. 1 each 6   buPROPion (WELLBUTRIN XL) 300 MG 24 hr tablet Take 1 tablet by mouth once daily 90 tablet 0   Calcium Carb-Cholecalciferol (CALCIUM + VITAMIN D3 PO) Take 1-2 tablets by mouth See admin instructions. Take 1 tablet by mouth in the morning and 2 tablets by mouth at bedtime     cetirizine (ZYRTEC) 10 MG tablet Take 10 mg by mouth every evening.     guaiFENesin-dextromethorphan (ROBITUSSIN DM) 100-10 MG/5ML syrup Take 5 mLs by mouth every 4 (four) hours as needed for cough (chest congestion). 118 mL 0   ibandronate (BONIVA) 150 MG tablet Take 1 tablet (150 mg total) by mouth every 30 (thirty) days. Take in the morning with a full glass of water, on an empty stomach, and do not take anything else by mouth or lie down for the next 30 min. 3 tablet 3   insulin isophane & regular human KwikPen (HUMULIN 70/30 KWIKPEN) (70-30) 100 UNIT/ML KwikPen INJECT 4 UNITS SUBCUTANEOUSLY WITH BREAKFAST AND 2 UNITS WITH SUPPER (Patient taking differently: Inject 4 Units into the skin 2 (two) times daily.) 15 mL 1   Multiple Vitamins-Minerals (MULTIVITAMIN WOMEN PO) Take 1 tablet by mouth  daily.     pantoprazole (PROTONIX) 40 MG tablet Take 1 tablet (40 mg total) by mouth daily. 90 tablet 1   Spacer/Aero-Holding Chambers (AEROCHAMBER MV) inhaler Use as instructed 1 each 2   cefdinir (OMNICEF) 300 MG capsule 1 cap po bid x 5d 10 capsule 0   No facility-administered medications prior to visit.    Allergies  Allergen  Reactions   Lactose Intolerance (Gi) Diarrhea   Motrin [Ibuprofen] Itching, Anxiety and Other (See Comments)    Anxiousness Hyperventilates     Review of Systems As per HPI  PE:    10/21/2022    1:59 PM 10/18/2022    7:30 PM 10/18/2022    4:29 PM  Vitals with BMI  Height _0     Weight 140 lbs 6 oz    BMI 61.44    Systolic 315 400 98  Diastolic 65 59 77  Pulse 867 108 113  O2 sat 95% on RA today  Physical Exam  General: Alert, sitting up in wheelchair, attends well, answers questions as per her usual. She does not appear acutely ill. Cardiovascular: Regular rhythm, tachycardic to about 115. No murmur. Chest is clear, no wheezing or rales. Normal symmetric air entry throughout both lung fields. No chest wall deformities or tenderness. Abdomen is soft, nontender and nondistended. Extremities without edema or cyanosis. Neuro: No focal weakness.  Cranial nerves II through XII intact grossly bilaterally.  LABS:  Last CBC Lab Results  Component Value Date   WBC 15.2 (H) 10/18/2022   HGB 11.8 (L) 10/18/2022   HCT 36.8 10/18/2022   MCV 89.1 10/18/2022   MCH 28.6 10/18/2022   RDW 15.4 10/18/2022   PLT 185 61/95/0932   Last metabolic panel Lab Results  Component Value Date   GLUCOSE 198 (H) 10/18/2022   NA 137 10/18/2022   K 4.7 10/18/2022   CL 105 10/18/2022   CO2 26 10/18/2022   BUN 18 10/18/2022   CREATININE 1.05 (H) 10/18/2022   GFRNONAA 56 (L) 10/18/2022   CALCIUM 8.8 (L) 10/18/2022   PHOS 2.9 05/22/2022   PROT 6.1 (L) 10/18/2022   ALBUMIN 3.2 (L) 10/18/2022   LABGLOB 2.7 09/19/2018   AGRATIO 1.6 09/19/2018   BILITOT 0.6  10/18/2022   ALKPHOS 59 10/18/2022   AST 17 10/18/2022   ALT 12 10/18/2022   ANIONGAP 6 10/18/2022   Last thyroid functions Lab Results  Component Value Date   TSH 1.30 10/03/2022   T3TOTAL 94.7 04/20/2017   Lab Results  Component Value Date   HGBA1C 7.3 (A) 08/04/2022   HGBA1C 7.3 08/04/2022   HGBA1C 7.3 (A) 08/04/2022   HGBA1C 7.3 (A) 08/04/2022   IMPRESSION AND PLAN:  1 generalized weakness. History of recurrent UTI with similar presentation in the past. Urinalysis normal at the ED 3 days ago but white blood cell count was 15K (+neutrophilia). She was unable to produce urine specimen for me today. She is tachycardic, similar to her past several visits here over the last few months (w/u unrevealing).  This has not typically been associated with any symptoms in her and I do not think it is the cause of any of her current illness. I think the best way to proceed at this point is to give empiric antibiotics: Rocephin 1 g IM here today. Tomorrow start Omnicef 300 mg twice a day x 5 days. Recheck in the office in 3 to 4 days.  An After Visit Summary was printed and given to the patient.  Spent 35 min with pt today reviewing HPI and ED records, reviewing relevant past history, doing exam, reviewing and discussing lab and imaging data, and formulating plans.  FOLLOW UP: Return for 3-4 day f/u weakness/uti.  Signed:  Crissie Sickles, MD           10/21/2022

## 2022-10-21 ENCOUNTER — Encounter: Payer: Self-pay | Admitting: Family Medicine

## 2022-10-21 ENCOUNTER — Ambulatory Visit (INDEPENDENT_AMBULATORY_CARE_PROVIDER_SITE_OTHER): Payer: Medicare Other | Admitting: Family Medicine

## 2022-10-21 VITALS — BP 102/65 | HR 108 | Temp 97.9°F | Ht 60.0 in | Wt 140.4 lb

## 2022-10-21 DIAGNOSIS — R531 Weakness: Secondary | ICD-10-CM | POA: Diagnosis not present

## 2022-10-21 DIAGNOSIS — N390 Urinary tract infection, site not specified: Secondary | ICD-10-CM

## 2022-10-21 DIAGNOSIS — R000 Tachycardia, unspecified: Secondary | ICD-10-CM

## 2022-10-21 MED ORDER — CEFTRIAXONE SODIUM 1 G IJ SOLR
1.0000 g | Freq: Once | INTRAMUSCULAR | Status: AC
  Administered 2022-10-21: 1 g via INTRAMUSCULAR

## 2022-10-21 MED ORDER — CEFDINIR 300 MG PO CAPS: ORAL_CAPSULE | ORAL | 0 refills | Status: DC

## 2022-10-21 NOTE — Addendum Note (Signed)
Addended by: Deveron Furlong D on: 10/21/2022 04:23 PM   Modules accepted: Orders

## 2022-10-23 LAB — CULTURE, BLOOD (ROUTINE X 2)
Culture: NO GROWTH
Culture: NO GROWTH
Special Requests: ADEQUATE
Special Requests: ADEQUATE

## 2022-10-25 ENCOUNTER — Encounter: Payer: Self-pay | Admitting: Family Medicine

## 2022-10-25 ENCOUNTER — Ambulatory Visit (INDEPENDENT_AMBULATORY_CARE_PROVIDER_SITE_OTHER): Payer: Medicare Other | Admitting: Family Medicine

## 2022-10-25 VITALS — BP 101/64 | HR 100 | Temp 98.0°F | Ht 60.0 in | Wt 142.0 lb

## 2022-10-25 DIAGNOSIS — N1831 Chronic kidney disease, stage 3a: Secondary | ICD-10-CM

## 2022-10-25 DIAGNOSIS — R000 Tachycardia, unspecified: Secondary | ICD-10-CM | POA: Diagnosis not present

## 2022-10-25 DIAGNOSIS — D72829 Elevated white blood cell count, unspecified: Secondary | ICD-10-CM

## 2022-10-25 DIAGNOSIS — N3000 Acute cystitis without hematuria: Secondary | ICD-10-CM

## 2022-10-25 DIAGNOSIS — R531 Weakness: Secondary | ICD-10-CM

## 2022-10-25 NOTE — Progress Notes (Signed)
OFFICE VISIT  10/25/2022  CC:  Chief Complaint  Patient presents with   Urinary Tract Infection    F/u;  Sx has unchange     Patient is a 77 y.o. female who presents for 4 day follow-up generalized weakness and UTI. A/P as of last visit: "1 generalized weakness. History of recurrent UTI with similar presentation in the past. Urinalysis normal at the ED 3 days ago but white blood cell count was 15K (+neutrophilia). She was unable to produce urine specimen for me today. She is tachycardic, similar to her past several visits here over the last few months (w/u unrevealing).  This has not typically been associated with any symptoms in her and I do not think it is the cause of any of her current illness. I think the best way to proceed at this point is to give empiric antibiotics: Rocephin 1 g IM here today. Tomorrow start Omnicef 300 mg twice a day x 5 days. Recheck in the office in 3 to 4 days."  INTERIM HX: Not feeling significantly improved. She is on day 4 of cefdinir. Urine has become more clear and does not have an odor.  Urinating more frequently than she had been.  No dysuria. Fatigue is her most prominent symptom still. Denies palpitations or dizziness or chest pain.  She has a bit of cough but no wheezing or shortness of breath. Appetite is poor.  Denies nausea, vomiting, swallowing difficulty, or abdominal pain.  Bowel movements normal.  ROS as above, plus--> no fevers,no HAs, no rashes, no melena/hematochezia.  No polyuria or polydipsia.  No myalgias or arthralgias.  No focal weakness, paresthesias, or tremors.  No acute vision or hearing abnormalities.  No recent changes in lower legs.   Past Medical History:  Diagnosis Date   Acute renal failure (HCC)    Age-related nuclear cataract of both eyes 2016   +cortical age related cataracts OU    Anxiety and depression    Bilateral diabetic retinopathy ([place]) 2015   Dr. 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   Colonic diverticular abscess    COPD (chronic obstructive pulmonary disease) (HCC)    CVA (cerebral vascular accident) ([place]) 09/2016   MRI did show a left-sided small ischemic stroke which was acute but likely incidental (MRI was done b/c pt had TIA sx's in hospital).  Neuro put her on plavix at that time.  Cardiology d/c'd her plavix 08/2017.   Diabetes mellitus with complication (HCC)    diab retinopathy OU (laser)   Diverticulitis of large intestine with perforation and abscess 09/11/2016   Finger fracture, right 04/2020   R 5th metacarpal fx at the base (Dr. Mardelle Matte)   GERD (gastroesophageal reflux disease)    protonix   History of concussion 08/25/2018   w/out loss of consciousness--mild increase in baseline memory impairment after.  CT head neg acute.   History of diverticulitis of colon    with abscess; required IR percutaneous drain placed 09/2016.   History of iron deficiency anemia    started iron 07/02/19.  EGD/colonoscopy 08/27/19 unrevealing. Capsule endoscopy considered but never done.   Hyperlipidemia    Hypertension    Left bundle branch block (LBBB) 12/16/2016   Lung cancer ([place])    non-small cell lung ca, stage III in 05/2011;  systemic chemotherapy concurrent with radiation followed by prophylactic cranial irradiation and has been observation since July of 2010 with no evidence for disease recurrence-released from onc f/u 12/2014 (needs annual cxr by PCP).  CXR 08/2015 stable.  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 Earlie Server did bx and this was NEG for malignancy.   Mild cognitive impairment with memory loss    Likely from brain radiation therapy   Non-obstructive CAD (coronary artery disease)    a. Cath 2006 preserved LV fxn, scattered irregularities without critical stenosis; b. 2008 stress echo negative for  ischemia, but with hypertensive response; c. 08/2016 Cath: D1 25%, otw nl.   Normocytic anemia 2018   Iron, vit B12, folate all normal 12/2016.   Open-angle glaucoma 2016   Dr. Shirley Muscat, (bilateral)---responding to topical therapy   Osteoarthritis of both hands    Osteoporosis 03/2016; 03/2018   03/2016 DEXA T-score -2.2.  03/2018 T-score L femoral neck  -2.7--boniva started. DEXA 04/2021 T scor -2.6->boniva continued. Plan rpt DEXA 2 yrs.   Pneumonia    hospitalization 11/2016; hypoxemic resp failure--d/c'd home with home oxygen therapy   Pulmonary nodule, left 08/2021   LUL 11 mm ground glass, needs f/u CT 08/2022.   Seasonal allergic rhinitis    Subclinical hyperthyroidism 2018   Takotsubo cardiomyopathy    a. 08/2016 Echo: EF 30-35%, gr1DD, PASP 75mHg;  b. 08/2016 Cath: nonobs Dzs;  c. 11/2016 Echo: EF 40-45%, no rwma, nl RV fxn, mild TR, PASP 348mg.  ECHO 02/2017 EF 50-55%, grd II DD---essentially resolved Takotsubo 02/2017.   [place]   a. 08/2016 in setting of diverticulitis & pneumoperitoneum and Takotsubo CM -->prolonged QT, seen by EP-->avoid meds with potential for QT prolongation.    Past Surgical History:  Procedure Laterality Date   ABDOMINAL HYSTERECTOMY  1997   APPENDECTOMY     CARDIAC CATHETERIZATION N/A 09/14/2016   Procedure: Left Heart Cath and Coronary Angiography;  Surgeon: ThTroy SineMD;  Location: [facility];  Service: Cardiovascular; Mild nonobstructive CAD with 85% smooth narrowing in the first diagonal branch of the LAD; 10% smooth narrowing of the ostial proximal left circumflex coronary artery; and a normal dominant RCA.   CARPAL TUNNEL RELEASE     CATARACT EXTRACTION Bilateral    CHOLECYSTECTOMY  2002   COLONOSCOPY  10/24/00; 08/27/19   2002 normal.  [employer] hemoccult testing via [facility] 06/18/15 was NEG.  2020->diverticulosis o/w nl.   dexa  03/2016; 03/2018   03/2018 T-score L femoral neck  -2.7 (worsened compared to osteopenic  range 2017.) DEXA 05/03/21 stable T score -2.6 on boniva x 2 yrs->plan rpt DEXA 2 yrs.   ESOPHAGOGASTRODUODENOSCOPY  08/27/2019   Normal.   IR GENERIC HISTORICAL  09/19/2016   IR SINUS/FIST TUBE CHK-NON GI 09/19/2016 JaCorrie MckusickDO [facility]   IR GENERIC HISTORICAL  10/06/2016   IR RADIOLOGIST EVAL & MGMT 10/06/2016 [facility]   IR GENERIC HISTORICAL  10/26/2016   IR RADIOLOGIST EVAL & MGMT 10/26/2016 [facility]   IR GENERIC HISTORICAL  11/03/2016   IR RADIOLOGIST EVAL & MGMT 11/03/2016 WeArdis RowanPA-C [facility]   IR GENERIC HISTORICAL  11/24/2016   IR RADIOLOGIST EVAL & MGMT 11/24/2016 WeArdis RowanPA-C [facility]   IR GENERIC HISTORICAL  12/05/2016   Fistula smaller/improving.  IR SINUS/FIST TUBE CHK-NON GI 12/05/2016 JoSandi MariscalMD [facility]   IR GENERIC HISTORICAL  12/22/2016   IR RADIOLOGIST EVAL &  MGMT 12/22/2016 Greggory Keen, MD [facility]   IR RADIOLOGIST EVAL & MGMT  01/10/2017   IR RADIOLOGIST EVAL & MGMT  02/09/2017   IR SINUS/FIST TUBE CHK-NON GI  03/28/2017   LEFT HEART CATHETERIZATION WITH CORONARY ANGIOGRAM N/A 05/30/2012   Procedure: LEFT HEART CATHETERIZATION WITH CORONARY ANGIOGRAM;  Surgeon: Burnell Blanks, MD;  Location: [facility] C[facility] LAB;  Service: Cardiovascular: Mild, non-obstructive CAD   TRANSTHORACIC ECHOCARDIOGRAM  09/14/2016; 11/2016   08/2016: EF 30-35 %, Akinesis of the mid-apicalanteroseptal myocardium.  Grade I DD.  Mild pulm HTN.  Repeat echo 11/2016, EF 40-45%, no rwma, nl RV fxn, mild TR, PASP 47mHg.     TRANSTHORACIC ECHOCARDIOGRAM  03/07/2017   EF 50-55%. Hypokinesis of the distal septum with overall low normal LV,  systolic function; mild diastolic dysfunction with elevated LV  filling pressure; mildly calcified aortic valve with mild AI; small pericardial effusion   TRANSTHORACIC ECHOCARDIOGRAM     04/2022 EF 60-65%, no WMA, no valve probs    Outpatient Medications Prior to Visit  Medication  Sig Dispense Refill   acetaminophen (TYLENOL) 500 MG tablet Take 1,000 mg by mouth daily as needed for headache.     albuterol (PROVENTIL) (2.5 MG/3ML) 0.083% nebulizer solution USE 1 VIAL IN NEBULIZER EVERY 4 HOURS AS NEEDED FOR WHEEZING AND FOR SHORTNESS OF BREATH 90 mL 1   albuterol (VENTOLIN HFA) 108 (90 Base) MCG/ACT inhaler INHALE 2 PUFFS BY MOUTH EVERY 6 HOURS AS NEEDED FOR WHEEZING OR SHORTNESS OF BREATH 18 g 1   aspirin 81 MG EC tablet Take 81 mg by mouth daily.      atorvastatin (LIPITOR) 40 MG tablet Take 1 tablet (40 mg total) by mouth daily. (Patient taking differently: Take 40 mg by mouth at bedtime.) 90 tablet 1   budesonide-formoterol (SYMBICORT) 160-4.5 MCG/ACT inhaler Inhale 2 puffs into the lungs 2 (two) times daily. 1 each 6   buPROPion (WELLBUTRIN XL) 300 MG 24 hr tablet Take 1 tablet by mouth once daily 90 tablet 0   Calcium Carb-Cholecalciferol (CALCIUM + VITAMIN D3 PO) Take 1-2 tablets by mouth See admin instructions. Take 1 tablet by mouth in the morning and 2 tablets by mouth at bedtime     cefdinir (OMNICEF) 300 MG capsule 1 cap po bid x 5d 10 capsule 0   cetirizine (ZYRTEC) 10 MG tablet Take 10 mg by mouth every evening.     guaiFENesin-dextromethorphan (ROBITUSSIN DM) 100-10 MG/5ML syrup Take 5 mLs by mouth every 4 (four) hours as needed for cough (chest congestion). 118 mL 0   ibandronate (BONIVA) 150 MG tablet Take 1 tablet (150 mg total) by mouth every 30 (thirty) days. Take in the morning with a full glass of water, on an empty stomach, and do not take anything else by mouth or lie down for the next 30 min. 3 tablet 3   insulin isophane & regular human KwikPen (HUMULIN 70/30 KWIKPEN) (70-30) 100 UNIT/ML KwikPen INJECT 4 UNITS SUBCUTANEOUSLY WITH BREAKFAST AND 2 UNITS WITH SUPPER (Patient taking differently: Inject 4 Units into the skin 2 (two) times daily.) 15 mL 1   Multiple Vitamins-Minerals (MULTIVITAMIN WOMEN PO) Take 1 tablet by mouth daily.     pantoprazole  (PROTONIX) 40 MG tablet Take 1 tablet (40 mg total) by mouth daily. 90 tablet 1   Spacer/Aero-Holding Chambers (AEROCHAMBER MV) inhaler Use as instructed 1 each 2   No facility-administered medications prior to visit.    Allergies  Allergen Reactions   Lactose  Intolerance (Gi) Diarrhea   Motrin [Ibuprofen] Itching, Anxiety and Other (See Comments)    Anxiousness Hyperventilates     Review of Systems As per HPI  PE:    10/25/2022    8:05 AM 10/21/2022    1:59 PM 10/18/2022    7:30 PM  Vitals with BMI  Height 5' 0" 5' 0"   Weight 142 lbs 140 lbs 6 oz   BMI 11.94 17.40   Systolic 814 481 856  Diastolic 64 65 59  Pulse 314 108 108   02 sat 97% RA today  Physical Exam  General: Alert, does not appear acutely ill.  Answers questions appropriately. CV: Regular, tachy approx 100, no m/r/g. Chest is clear, no wheezing or rales. Normal symmetric air entry throughout both lung fields. No chest wall deformities or tenderness. ABD: soft, NT/ND EXT: no clubbing or cyanosis.  no edema.   LABS:  Last CBC Lab Results  Component Value Date   WBC 15.2 (H) 10/18/2022   HGB 11.8 (L) 10/18/2022   HCT 36.8 10/18/2022   MCV 89.1 10/18/2022   MCH 28.6 10/18/2022   RDW 15.4 10/18/2022   PLT 185 97/11/6376   Last metabolic panel Lab Results  Component Value Date   GLUCOSE 198 (H) 10/18/2022   NA 137 10/18/2022   K 4.7 10/18/2022   CL 105 10/18/2022   CO2 26 10/18/2022   BUN 18 10/18/2022   CREATININE 1.05 (H) 10/18/2022   GFRNONAA 56 (L) 10/18/2022   CALCIUM 8.8 (L) 10/18/2022   PHOS 2.9 05/22/2022   PROT 6.1 (L) 10/18/2022   ALBUMIN 3.2 (L) 10/18/2022   LABGLOB 2.7 09/19/2018   AGRATIO 1.6 09/19/2018   BILITOT 0.6 10/18/2022   ALKPHOS 59 10/18/2022   AST 17 10/18/2022   ALT 12 10/18/2022   ANIONGAP 6 10/18/2022   Lab Results  Component Value Date   DDIMER 1.19 (H) 10/15/2019   IMPRESSION AND PLAN:  #1 generalized fatigue, acute. Workup has been unrevealing other  than tachycardia, which preceded the generalized fatigue for approximately 6 weeks. Subtle UTI symptoms--started empiric treatment 4 days ago.  Finish cefdinir.  #2 tachycardia, unknown cause. This has not changed with trial of cutting back on Symbicort and cutting back on Wellbutrin. No dehydration significant weight change.  EKG without arrhythmia.  She has known prolonged QT and left bundle branch block. 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 She has minimal pulmonary symptoms but I will check a D-dimer and if positive we will proceed with CT of chest. Additionally, will get her back to her cardiologist, Dr. Ellyn Hack, to see if anything further to do from a cardiac standpoint right now.  #3 chronic renal insufficiency stage III. Monitoring electrolytes and creatinine today.  4.  Leukocytosis.  This was noted in the emergency department on 10/18/2022. Repeat CBC with differential today.  An After Visit Summary was printed and given to the patient.  FOLLOW UP: No follow-ups on file.  Signed:  Crissie Sickles, MD           10/25/2022

## 2022-10-25 NOTE — Patient Instructions (Addendum)
Leonie Man, MD [phone]

## 2022-11-03 ENCOUNTER — Encounter: Payer: Self-pay | Admitting: Family Medicine

## 2022-11-03 ENCOUNTER — Ambulatory Visit (INDEPENDENT_AMBULATORY_CARE_PROVIDER_SITE_OTHER): Payer: Medicare Other | Admitting: Family Medicine

## 2022-11-03 VITALS — BP 110/67 | HR 108 | Temp 97.8°F | Wt 142.0 lb

## 2022-11-03 DIAGNOSIS — R000 Tachycardia, unspecified: Secondary | ICD-10-CM | POA: Diagnosis not present

## 2022-11-03 DIAGNOSIS — R5382 Chronic fatigue, unspecified: Secondary | ICD-10-CM

## 2022-11-03 LAB — BASIC METABOLIC PANEL
BUN: 22 mg/dL (ref 6–23)
CO2: 27 mEq/L (ref 19–32)
Calcium: 9 mg/dL (ref 8.4–10.5)
Chloride: 103 mEq/L (ref 96–112)
Creatinine, Ser: 1.19 mg/dL (ref 0.40–1.20)
GFR: 45.09 mL/min — ABNORMAL LOW (ref >60.00)
Glucose, Bld: 268 mg/dL — ABNORMAL HIGH (ref 70–99)
Potassium: 4.6 mEq/L (ref 3.5–5.1)
Sodium: 138 mEq/L (ref 135–145)

## 2022-11-03 LAB — CBC WITH DIFFERENTIAL/PLATELET
Basophils Absolute: 0 10*3/uL (ref 0.0–0.1)
Basophils Relative: 0.2 % (ref 0.0–3.0)
Eosinophils Absolute: 0.1 10*3/uL (ref 0.0–0.7)
Eosinophils Relative: 1.4 % (ref 0.0–5.0)
HCT: 38.2 % (ref 36.0–46.0)
Hemoglobin: 12.6 g/dL (ref 12.0–15.0)
Lymphocytes Relative: 14.2 % (ref 12.0–46.0)
Lymphs Abs: 1.3 10*3/uL (ref 0.7–4.0)
MCHC: 33 g/dL (ref 30.0–36.0)
MCV: 86.7 fl (ref 78.0–100.0)
Monocytes Absolute: 0.8 10*3/uL (ref 0.1–1.0)
Monocytes Relative: 8.3 % (ref 3.0–12.0)
Neutro Abs: 6.9 10*3/uL (ref 1.4–7.7)
Neutrophils Relative %: 75.9 % (ref 43.0–77.0)
Platelets: 320 10*3/uL (ref 150.0–400.0)
RBC: 4.4 Mil/uL (ref 3.87–5.11)
RDW: 17 % — ABNORMAL HIGH (ref 11.5–15.5)
WBC: 9.1 10*3/uL (ref 4.0–10.5)

## 2022-11-03 NOTE — Progress Notes (Signed)
OFFICE VISIT  11/03/2022  CC:  Chief Complaint  Patient presents with   Fatigue    Patient is a 77 y.o. female who presents accompanied by her grandson for 9-day follow-up fatigue and tachycardia. A/P as of last visit: "1 generalized fatigue, acute. Workup has been unrevealing other than tachycardia, which preceded the generalized fatigue for approximately 6 weeks. Subtle UTI symptoms--started empiric treatment 4 days ago.  Finish cefdinir.   #2 tachycardia, unknown cause. This has not changed with trial of cutting back on Symbicort and cutting back on Wellbutrin. No dehydration significant weight change.  EKG without arrhythmia.  She has known prolonged QT and left bundle branch block. 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 She has minimal pulmonary symptoms but I will check a D-dimer and if positive we will proceed with CT of chest. Additionally, will get her back to her cardiologist, Dr. Ellyn Hack, to see if anything further to do from a cardiac standpoint right now.   #3 chronic renal insufficiency stage III. Monitoring electrolytes and creatinine today.   4.  Leukocytosis.  This was noted in the emergency department on 10/18/2022. Repeat CBC with differential today."  INTERIM HX: She did not get the labs I ordered last visit. She has an appointment with Dr. Ellyn Hack set up for 11/15/2022.  She says she feels better.  Her grandson says he agrees. May be still some decreased ability to get up out of her chair without feeling too tired. Otherwise when she gets up and walks around she has her usual decreased stamina.  No chest pain.  No shortness of breath at rest.  No cough or wheezing lately.  No fevers. No lower extremity pain or swelling. Denies palpitations or feeling that her heart is racing.  No dizziness. No urinary concerns.  Past Medical History:  Diagnosis Date   Acute renal failure (HCC)    Age-related  nuclear cataract of both eyes 2016   +cortical age related cataracts OU    Anxiety and depression    Bilateral diabetic retinopathy ([place]) 2015   Dr. 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   Colonic diverticular abscess    COPD (chronic obstructive pulmonary disease) (HCC)    CVA (cerebral vascular accident) ([place]) 09/2016   MRI did show a left-sided small ischemic stroke which was acute but likely incidental (MRI was done b/c pt had TIA sx's in hospital).  Neuro put her on plavix at that time.  Cardiology d/c'd her plavix 08/2017.   Diabetes mellitus with complication (HCC)    diab retinopathy OU (laser)   Diverticulitis of large intestine with perforation and abscess 09/11/2016   Finger fracture, right 04/2020   R 5th metacarpal fx at the base (Dr. Mardelle Matte)   GERD (gastroesophageal reflux disease)    protonix   History of concussion 08/25/2018   w/out loss of consciousness--mild increase in baseline memory impairment after.  CT head neg acute.   History of diverticulitis of colon    with abscess; required IR percutaneous drain placed 09/2016.   History of iron deficiency anemia    started iron 07/02/19.  EGD/colonoscopy 08/27/19 unrevealing. Capsule endoscopy considered but never done.   Hyperlipidemia    Hypertension    Left bundle branch block (LBBB) 12/16/2016  Lung cancer ([place])    non-small cell lung ca, stage III in 05/2011; systemic chemotherapy concurrent with radiation followed by prophylactic cranial irradiation and has been observation since July of 2010 with no evidence for disease recurrence-released from onc f/u 12/2014 (needs annual cxr by PCP).  CXR 08/2015 stable.  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 Earlie Server did bx and this was NEG for malignancy.   Mild cognitive impairment with memory  loss    Likely from brain radiation therapy   Non-obstructive CAD (coronary artery disease)    a. Cath 2006 preserved LV fxn, scattered irregularities without critical stenosis; b. 2008 stress echo negative for ischemia, but with hypertensive response; c. 08/2016 Cath: D1 25%, otw nl.   Normocytic anemia 2018   Iron, vit B12, folate all normal 12/2016.   Open-angle glaucoma 2016   Dr. Shirley Muscat, (bilateral)---responding to topical therapy   Osteoarthritis of both hands    Osteoporosis 03/2016; 03/2018   03/2016 DEXA T-score -2.2.  03/2018 T-score L femoral neck  -2.7--boniva started. DEXA 04/2021 T scor -2.6->boniva continued. Plan rpt DEXA 2 yrs.   Pneumonia    hospitalization 11/2016; hypoxemic resp failure--d/c'd home with home oxygen therapy   Pulmonary nodule, left 08/2021   LUL 11 mm ground glass, needs f/u CT 08/2022.   Seasonal allergic rhinitis    Subclinical hyperthyroidism 2018   Takotsubo cardiomyopathy    a. 08/2016 Echo: EF 30-35%, gr1DD, PASP 35mmHg;  b. 08/2016 Cath: nonobs Dzs;  c. 11/2016 Echo: EF 40-45%, no rwma, nl RV fxn, mild TR, PASP 30mmHg.  ECHO 02/2017 EF 50-55%, grd II DD---essentially resolved Takotsubo 02/2017.   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 08/2016 in setting of diverticulitis & pneumoperitoneum and Takotsubo CM -->prolonged QT, seen by EP-->avoid meds with potential for QT prolongation.    Past Surgical History:  Procedure Laterality Date   ABDOMINAL HYSTERECTOMY  1997   APPENDECTOMY     CARDIAC CATHETERIZATION N/A 09/14/2016   Procedure: Left Heart Cath and Coronary Angiography;  Surgeon: Troy Sine, MD;  Location: [facility];  Service: Cardiovascular; Mild nonobstructive CAD with 85% smooth narrowing in the first diagonal branch of the LAD; 10% smooth narrowing of the ostial proximal left circumflex coronary artery; and a normal dominant RCA.   CARPAL TUNNEL RELEASE     CATARACT EXTRACTION Bilateral    CHOLECYSTECTOMY  2002   COLONOSCOPY   10/24/00; 08/27/19   2002 normal.  [employer] hemoccult testing via [facility] 06/18/15 was NEG.  2020->diverticulosis o/w nl.   dexa  03/2016; 03/2018   03/2018 T-score L femoral neck  -2.7 (worsened compared to osteopenic range 2017.) DEXA 05/03/21 stable T score -2.6 on boniva x 2 yrs->plan rpt DEXA 2 yrs.   ESOPHAGOGASTRODUODENOSCOPY  08/27/2019   Normal.   IR GENERIC HISTORICAL  09/19/2016   IR SINUS/FIST TUBE CHK-NON GI 09/19/2016 Corrie Mckusick, DO [facility]   IR GENERIC HISTORICAL  10/06/2016   IR RADIOLOGIST EVAL & MGMT 10/06/2016 [facility]   IR GENERIC HISTORICAL  10/26/2016   IR RADIOLOGIST EVAL & MGMT 10/26/2016 [facility]   IR GENERIC HISTORICAL  11/03/2016   IR RADIOLOGIST EVAL & MGMT 11/03/2016 Ardis Rowan, PA-C [facility]   IR GENERIC HISTORICAL  11/24/2016   IR RADIOLOGIST EVAL & MGMT 11/24/2016 Ardis Rowan, PA-C [facility]   IR GENERIC HISTORICAL  12/05/2016   Fistula smaller/improving.  IR SINUS/FIST TUBE CHK-NON GI 12/05/2016 Sandi Mariscal, MD  [facility]   IR GENERIC HISTORICAL  12/22/2016   IR RADIOLOGIST EVAL & MGMT 12/22/2016 Greggory Keen, MD [facility]   IR RADIOLOGIST EVAL & MGMT  01/10/2017   IR RADIOLOGIST EVAL & MGMT  02/09/2017   IR SINUS/FIST TUBE CHK-NON GI  03/28/2017   LEFT HEART CATHETERIZATION WITH CORONARY ANGIOGRAM N/A 05/30/2012   Procedure: LEFT HEART CATHETERIZATION WITH CORONARY ANGIOGRAM;  Surgeon: Burnell Blanks, MD;  Location: [facility];  Service: Cardiovascular: Mild, non-obstructive CAD   TRANSTHORACIC ECHOCARDIOGRAM  09/14/2016; 11/2016   08/2016: EF 30-35 %, Akinesis of the mid-apicalanteroseptal myocardium.  Grade I DD.  Mild pulm HTN.  Repeat echo 11/2016, EF 40-45%, no rwma, nl RV fxn, mild TR, PASP 41mmHg.     TRANSTHORACIC ECHOCARDIOGRAM  03/07/2017   EF 50-55%. Hypokinesis of the distal septum with overall low normal LV,  systolic function; mild diastolic dysfunction with elevated LV  filling  pressure; mildly calcified aortic valve with mild AI; small pericardial effusion   TRANSTHORACIC ECHOCARDIOGRAM     04/2022 EF 60-65%, no WMA, no valve probs    Outpatient Medications Prior to Visit  Medication Sig Dispense Refill   acetaminophen (TYLENOL) 500 MG tablet Take 1,000 mg by mouth daily as needed for headache.     albuterol (PROVENTIL) (2.5 MG/3ML) 0.083% nebulizer solution USE 1 VIAL IN NEBULIZER EVERY 4 HOURS AS NEEDED FOR WHEEZING AND FOR SHORTNESS OF BREATH 90 mL 1   albuterol (VENTOLIN HFA) 108 (90 Base) MCG/ACT inhaler INHALE 2 PUFFS BY MOUTH EVERY 6 HOURS AS NEEDED FOR WHEEZING OR SHORTNESS OF BREATH 18 g 1   aspirin 81 MG EC tablet Take 81 mg by mouth daily.      atorvastatin (LIPITOR) 40 MG tablet Take 1 tablet (40 mg total) by mouth daily. (Patient taking differently: Take 40 mg by mouth at bedtime.) 90 tablet 1   budesonide-formoterol (SYMBICORT) 160-4.5 MCG/ACT inhaler Inhale 2 puffs into the lungs 2 (two) times daily. 1 each 6   buPROPion (WELLBUTRIN XL) 300 MG 24 hr tablet Take 1 tablet by mouth once daily 90 tablet 0   Calcium Carb-Cholecalciferol (CALCIUM + VITAMIN D3 PO) Take 1-2 tablets by mouth See admin instructions. Take 1 tablet by mouth in the morning and 2 tablets by mouth at bedtime     cefdinir (OMNICEF) 300 MG capsule 1 cap po bid x 5d 10 capsule 0   cetirizine (ZYRTEC) 10 MG tablet Take 10 mg by mouth every evening.     guaiFENesin-dextromethorphan (ROBITUSSIN DM) 100-10 MG/5ML syrup Take 5 mLs by mouth every 4 (four) hours as needed for cough (chest congestion). 118 mL 0   ibandronate (BONIVA) 150 MG tablet Take 1 tablet (150 mg total) by mouth every 30 (thirty) days. Take in the morning with a full glass of water, on an empty stomach, and do not take anything else by mouth or lie down for the next 30 min. 3 tablet 3   insulin isophane & regular human KwikPen (HUMULIN 70/30 KWIKPEN) (70-30) 100 UNIT/ML KwikPen INJECT 4 UNITS SUBCUTANEOUSLY WITH BREAKFAST AND  2 UNITS WITH SUPPER (Patient taking differently: Inject 4 Units into the skin 2 (two) times daily.) 15 mL 1   Multiple Vitamins-Minerals (MULTIVITAMIN WOMEN PO) Take 1 tablet by mouth daily.     pantoprazole (PROTONIX) 40 MG tablet Take 1 tablet (40 mg total) by mouth daily. 90 tablet 1   Spacer/Aero-Holding Chambers (AEROCHAMBER MV) inhaler Use as instructed 1 each 2   No  facility-administered medications prior to visit.    Allergies  Allergen Reactions   Lactose Intolerance (Gi) Diarrhea   Motrin [Ibuprofen] Itching, Anxiety and Other (See Comments)    Anxiousness Hyperventilates     Review of Systems As per HPI  PE:    11/03/2022   10:09 AM 10/25/2022    8:05 AM 10/21/2022    1:59 PM  Vitals with BMI  Height  5\' 0"  5\' 0"   Weight 142 lbs 142 lbs 140 lbs 6 oz  BMI 27.73 06.23 76.28  Systolic 315 176 160  Diastolic 67 64 65  Pulse 737 100 108     Physical Exam  Gen: Alert, well appearing.  Patient is oriented to person, place, time, and situation. AFFECT: pleasant, lucid thought and speech. CV: Regular rhythm, tachycardic to 110 or so.  No m/r/g.   LUNGS: CTA bilat, nonlabored resps, good aeration in all lung fields except mildly diminished in the right base. EXT: no clubbing or cyanosis.  no edema.    LABS:  Last CBC Lab Results  Component Value Date   WBC 15.2 (H) 10/18/2022   HGB 11.8 (L) 10/18/2022   HCT 36.8 10/18/2022   MCV 89.1 10/18/2022   MCH 28.6 10/18/2022   RDW 15.4 10/18/2022   PLT 185 10/62/6948   Last metabolic panel Lab Results  Component Value Date   GLUCOSE 198 (H) 10/18/2022   NA 137 10/18/2022   K 4.7 10/18/2022   CL 105 10/18/2022   CO2 26 10/18/2022   BUN 18 10/18/2022   CREATININE 1.05 (H) 10/18/2022   GFRNONAA 56 (L) 10/18/2022   CALCIUM 8.8 (L) 10/18/2022   PHOS 2.9 05/22/2022   PROT 6.1 (L) 10/18/2022   ALBUMIN 3.2 (L) 10/18/2022   LABGLOB 2.7 09/19/2018   AGRATIO 1.6 09/19/2018   BILITOT 0.6 10/18/2022   ALKPHOS 59  10/18/2022   AST 17 10/18/2022   ALT 12 10/18/2022   ANIONGAP 6 10/18/2022   Last thyroid functions Lab Results  Component Value Date   TSH 1.30 10/03/2022   T3TOTAL 94.7 04/20/2017   Lab Results  Component Value Date   DDIMER 1.19 (H) 10/15/2019   IMPRESSION AND PLAN:  1 generalized fatigue, acute. Workup has been unrevealing other than tachycardia, which preceded the generalized fatigue for approximately 7 weeks. This is improving some now and she is almost back to baseline.   #2 tachycardia, unknown cause. This has not changed with trial of cutting back on Symbicort and cutting back on Wellbutrin. No dehydration significant weight change.  EKG without arrhythmia.  She has known prolonged QT and left bundle branch block. 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 Plan was to get a D-dimer last visit but this never happened.  Will go ahead and get this today. If positive we will proceed with CT of chest. She has made an appointment with her heart doctor as well, set for 11/15/2022.  3 chronic renal insufficiency stage III. Monitoring electrolytes and creatinine today.   4.  Leukocytosis.  This was noted in the emergency department on 10/18/2022. Repeat CBC with differential today.  An After Visit Summary was printed and given to the patient.  FOLLOW UP: Return for Keep appt already scheduled with me for 11/18/22.  Signed:  Crissie Sickles, MD           11/03/2022

## 2022-11-03 NOTE — Addendum Note (Signed)
Addended by: Beatrix Fetters on: 11/03/2022 10:53 AM   Modules accepted: Orders

## 2022-11-04 ENCOUNTER — Other Ambulatory Visit: Payer: Self-pay | Admitting: Family Medicine

## 2022-11-04 DIAGNOSIS — J449 Chronic obstructive pulmonary disease, unspecified: Secondary | ICD-10-CM

## 2022-11-04 DIAGNOSIS — R000 Tachycardia, unspecified: Secondary | ICD-10-CM

## 2022-11-04 DIAGNOSIS — N183 Chronic kidney disease, stage 3 unspecified: Secondary | ICD-10-CM

## 2022-11-04 DIAGNOSIS — R7989 Other specified abnormal findings of blood chemistry: Secondary | ICD-10-CM

## 2022-11-04 LAB — D-DIMER, QUANTITATIVE: D-Dimer, Quant: 1.35 mcg/mL FEU — ABNORMAL HIGH (ref <0.50)

## 2022-11-07 ENCOUNTER — Other Ambulatory Visit: Payer: Self-pay | Admitting: Family Medicine

## 2022-11-07 DIAGNOSIS — Z794 Long term (current) use of insulin: Secondary | ICD-10-CM

## 2022-11-10 ENCOUNTER — Ambulatory Visit (HOSPITAL_BASED_OUTPATIENT_CLINIC_OR_DEPARTMENT_OTHER)
Admission: RE | Admit: 2022-11-10 | Discharge: 2022-11-10 | Disposition: A | Discharge status: Home / Self Care | Payer: Medicare Other | Source: Ambulatory Visit | Attending: Family Medicine | Admitting: Family Medicine

## 2022-11-10 DIAGNOSIS — R000 Tachycardia, unspecified: Secondary | ICD-10-CM

## 2022-11-10 DIAGNOSIS — R7989 Other specified abnormal findings of blood chemistry: Secondary | ICD-10-CM

## 2022-11-10 DIAGNOSIS — J439 Emphysema, unspecified: Secondary | ICD-10-CM | POA: Diagnosis not present

## 2022-11-10 MED ORDER — IOHEXOL 350 MG/ML SOLN
75.0000 mL | Freq: Once | INTRAVENOUS | Status: AC | PRN
  Administered 2022-11-10: 75 mL via INTRAVENOUS

## 2022-11-15 ENCOUNTER — Ambulatory Visit: Payer: Medicare Other | Attending: Cardiology | Admitting: Cardiology

## 2022-11-15 ENCOUNTER — Ambulatory Visit: Payer: Medicare Other | Attending: Cardiology

## 2022-11-15 ENCOUNTER — Encounter: Payer: Self-pay | Admitting: Cardiology

## 2022-11-15 VITALS — BP 120/74 | HR 108 | Ht 62.0 in | Wt 143.6 lb

## 2022-11-15 DIAGNOSIS — I5032 Chronic diastolic (congestive) heart failure: Secondary | ICD-10-CM

## 2022-11-15 DIAGNOSIS — I951 Orthostatic hypotension: Secondary | ICD-10-CM | POA: Diagnosis not present

## 2022-11-15 DIAGNOSIS — R5382 Chronic fatigue, unspecified: Secondary | ICD-10-CM

## 2022-11-15 DIAGNOSIS — I2510 Atherosclerotic heart disease of native coronary artery without angina pectoris: Secondary | ICD-10-CM | POA: Diagnosis not present

## 2022-11-15 DIAGNOSIS — R000 Tachycardia, unspecified: Secondary | ICD-10-CM

## 2022-11-15 DIAGNOSIS — E782 Mixed hyperlipidemia: Secondary | ICD-10-CM

## 2022-11-15 NOTE — Progress Notes (Signed)
Primary Care Provider: [name], MD [place] Cardiologist: Glenetta Hew, MD Electrophysiologist: None  Clinic Note: Chief Complaint  Patient presents with   New Patient (Initial Visit)    Previously seen in 2019; referred for fatigue and tachycardia   ===================================  ASSESSMENT/PLAN   Problem List Items Addressed This Visit       Cardiology Problems   Non-occlusive coronary artery disease    Previously documented nonocclusive disease-most recent catheter 2017  Aortic atherosclerosis demonstrated on recent CTA PE study. Last LDL 55 in 01/2022. - Continue Atorvastatin 40mg  daily (med list indicates that this was discontinued.  We can clarify at follow-up but recommend that she stays on a statin) - Continue ASA 81mg  daily      Chronic diastolic (congestive) heart failure (HCC) (Chronic)    With anything, she seems a little bit volume down.  No active CHF symptoms.      Orthostatic hypotension    He does have a history of orthostatic hypotension which would lead me believe that she has a tendency to be somewhat volume down.  She clearly does not meet the goal of at least 60 if not 70 to 80 ounces of water a day.  Part of this reason is because of frequent urination.  Will allow for permissive hypertension and treat for hypertension per se.      Hyperlipidemia (Chronic)    Will clarify at follow-up whether she is indeed taking Lipitor or not.        Other   Sinus tachycardia by electrocardiogram   Other Visit Diagnoses     Sinus tachycardia    -  Primary   Relevant Orders   EKG 12-Lead (Completed)   LONG TERM MONITOR (3-14 DAYS)   Chronic fatigue       Relevant Orders   EKG 12-Lead (Completed)   LONG TERM MONITOR (3-14 DAYS)       ===================================  HPI:    [name] is a 77 y.o. female with a PMH of Takotsubo CM and Cardiac Arrest (Torsades De Pointes) in the setting of Septic Shock  in 2017 with subsequent recovery of EF (normal EF on Echo 02/2017), h/o Sm Cell Lung Ca, COPD & CVA who presents today for evaluation of sinus tachycardia at the request of Dr. Katrine Coho [name] was last seen on 09/19/2018. Was asymptomatic at that time and was released from cardiology follow-up.  Lasix reduced to 20 mg every other day and atorvastatin reduced to 40 mg daily.  Recent Hospitalizations:  07/2022- SIRS 2/2 Acute Bronchitis with COPD Exacerbation 04/2022- Atypical chest pain, unremarkable cardiac workup, orthostatic hypotension 09/2021- COPD Exacerbation and hyponatremia in the setting of COVID infection  She was seen by Dr. Anitra Lauth as a 4-day hospital follow-up-noted to have generalized weakness for UTI.  She noted to be tachycardic with fatigue.  Treated empirically with Rocephin 1 g IV followed by Omnicef.  When he saw her back on the January 2, she did not notice any significant improvement was complaining of Omnicef.  Urine was becoming more clear but having more frequent urination.  Still a prominent fatigue.  No palpitations.  No fevers or chills. => Regular persistent/tachycardia, she is referred for cardiology evaluation.  DVT PE ordered.  Reviewed  CV studies:    The following studies were reviewed today: (if available, images/films reviewed: From Epic Chart or Care Everywhere) CTA PE Study 11/10/22: No evidence of acute PE or other acute vascular finding in  chest.  Mild increased patchy parabronchial opacity dependently RLL-could reflect acute inflammation/infection.  No consolidation or suspicious pulmonary nodule.  Stable post XRT changes in the right perihilar region.  Emphysema and aortic atherosclerosis noted.  Echocardiogram 05/22/2022: LVEF 60-65%, no wall motion abnormalities. Poor visualization of the valves.   Interval History:   [name]  Here at the request of her PCP for evaluation of sinus tachycardia in the setting of generalized  fatigue. HR with the PCP has been in the range of 100-108. Positive D-Dimer with CTA negative for PE.  PCP attempted to address tachycardia by decreasing her bupropion and Symbicort without success. TSH WNL, not anemic.   In our discussion, [name] denied having any issues at all with chest pain or pressure with rest or exertion.  He does not really feel any palpitations, she just feels "tired all the time ".  She does not really do much around the house besides going to and from her bedroom to the bathroom and kitchen.  She does not like to much activity at all.  Pretty much sedentary.  She says that her family does not let her do anything because of her "help ".  Despite having fatigue, she really is not noticing any untoward symptoms of volume overload.  No PND orthopnea edema.  No syncope or near syncope, TIA or amaurosis fugax.  CV Review of Symptoms (Summary): Cardiovascular ROS: negative for - chest pain, dyspnea on exertion, edema, irregular heartbeat, loss of consciousness, or orthopnea  REVIEWED OF SYSTEMS   Review of Systems  Constitutional:  Positive for malaise/fatigue. Negative for chills, diaphoresis and fever.  Respiratory:  Positive for shortness of breath (at baseline).   Cardiovascular:  Negative for chest pain, palpitations, orthopnea, leg swelling and PND.  Neurological:  Negative for loss of consciousness.  All other systems reviewed and are negative.   I have reviewed and (if needed) personally updated the patient's problem list, medications, allergies, past medical and surgical history, social and family history.   PAST MEDICAL HISTORY   Past Medical History:  Diagnosis Date   Acute renal failure (HCC)    Age-related nuclear cataract of both eyes 2016   +cortical age related cataracts OU    Anxiety and depression    Bilateral diabetic retinopathy (HCC) 2015   Dr. 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   Colonic diverticular abscess    COPD (chronic obstructive pulmonary disease) (HCC)    CVA (cerebral vascular accident) (HCC) 09/2016   MRI did show a left-sided small ischemic stroke which was acute but likely incidental (MRI was done b/c pt had TIA sx's in hospital).  Neuro put her on plavix at that time.  Cardiology d/c'd her plavix 08/2017.   Diabetes mellitus with complication (HCC)    diab retinopathy OU (laser)   Diverticulitis of large intestine with perforation and abscess 09/11/2016   Finger fracture, right 04/2020   R 5th metacarpal fx at the base (Dr. Dion Saucier)   GERD (gastroesophageal reflux disease)    protonix   History of concussion 08/25/2018   w/out loss of consciousness--mild increase in baseline memory impairment after.  CT head neg acute.   History of diverticulitis of colon    with abscess; required IR percutaneous drain placed 09/2016.   History of  iron deficiency anemia    started iron 07/02/19.  EGD/colonoscopy 08/27/19 unrevealing. Capsule endoscopy considered but never done.   Hyperlipidemia    Hypertension    Left bundle branch block (LBBB) 12/16/2016   Lung cancer (HCC)    non-small cell lung ca, stage III in 05/2011; systemic chemotherapy concurrent with radiation followed by prophylactic cranial irradiation and has been observation since July of 2010 with no evidence for disease recurrence-released from onc f/u 12/2014 (needs annual cxr by PCP).  CXR 08/2015 stable.  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 Shirline Frees did bx and this was NEG for malignancy.   Mild cognitive impairment with memory loss    Likely from brain radiation therapy   Non-obstructive CAD (coronary artery disease)    a. Cath 2006 preserved LV fxn, scattered irregularities without critical stenosis; b. 2008 stress echo negative for ischemia, but with hypertensive  response; c. 08/2016 Cath: D1 25%, otw nl.   Normocytic anemia 2018   Iron, vit B12, folate all normal 12/2016.   Open-angle glaucoma 2016   Dr. Hanley Seamen, (bilateral)---responding to topical therapy   Osteoarthritis of both hands    Osteoporosis 03/2016; 03/2018   03/2016 DEXA T-score -2.2.  03/2018 T-score L femoral neck  -2.7--boniva started. DEXA 04/2021 T scor -2.6->boniva continued. Plan rpt DEXA 2 yrs.   Pneumonia    hospitalization 11/2016; hypoxemic resp failure--d/c'd home with home oxygen therapy   Pulmonary nodule, left 08/2021   LUL 11 mm ground glass, needs f/u CT 08/2022.   Seasonal allergic rhinitis    Subclinical hyperthyroidism 2018   Takotsubo cardiomyopathy    a. 08/2016 Echo: EF 30-35%, gr1DD, PASP ;  b. 08/2016 Cath: nonobs Dzs;  c. 11/2016 Echo: EF 40-45%, no rwma, nl RV fxn, mild TR, PASP .  ECHO 02/2017 EF 50-55%, grd II DD---essentially resolved Takotsubo 02/2017.   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 08/2016 in setting of diverticulitis & pneumoperitoneum and Takotsubo CM -->prolonged QT, seen by EP-->avoid meds with potential for QT prolongation.    PAST SURGICAL HISTORY   Past Surgical History:  Procedure Laterality Date   ABDOMINAL HYSTERECTOMY  1997   APPENDECTOMY     CARDIAC CATHETERIZATION N/A 09/14/2016   Procedure: Left Heart Cath and Coronary Angiography;  Surgeon: Lennette Bihari, MD;  Location: [facility] INVASIVE CV LAB;  Service: Cardiovascular; Mild nonobstructive CAD with 85% smooth narrowing in the first diagonal branch of the LAD; 10% smooth narrowing of the ostial proximal left circumflex coronary artery; and a normal dominant RCA.   CARPAL TUNNEL RELEASE     CATARACT EXTRACTION Bilateral    CHOLECYSTECTOMY  2002   COLONOSCOPY  10/24/00; 08/27/19   2002 normal.  [employer] hemoccult testing via [facility] 06/18/15 was NEG.  2020->diverticulosis o/w nl.   dexa  03/2016; 03/2018   03/2018 T-score L femoral neck  -2.7 (worsened compared to osteopenic range  2017.) DEXA 05/03/21 stable T score -2.6 on boniva x 2 yrs->plan rpt DEXA 2 yrs.   ESOPHAGOGASTRODUODENOSCOPY  08/27/2019   Normal.   IR GENERIC HISTORICAL  09/19/2016   IR SINUS/FIST TUBE CHK-NON GI 09/19/2016 Gilmer Mor, DO [facility]   IR GENERIC HISTORICAL  10/06/2016   IR RADIOLOGIST EVAL & MGMT 10/06/2016 [facility]   IR GENERIC HISTORICAL  10/26/2016   IR RADIOLOGIST EVAL & MGMT 10/26/2016 [facility]   IR GENERIC HISTORICAL  11/03/2016   IR RADIOLOGIST EVAL & MGMT 11/03/2016 Gershon Crane, PA-C [facility] INTER[facility]  IR GENERIC HISTORICAL  11/24/2016   IR RADIOLOGIST EVAL & MGMT 11/24/2016 Gershon Crane, PA-C [facility]   IR GENERIC HISTORICAL  12/05/2016   Fistula smaller/improving.  IR SINUS/FIST TUBE CHK-NON GI 12/05/2016 Simonne Come, MD [facility]   IR GENERIC HISTORICAL  12/22/2016   IR RADIOLOGIST EVAL & MGMT 12/22/2016 Berdine Dance, MD [facility]   IR RADIOLOGIST EVAL & MGMT  01/10/2017   IR RADIOLOGIST EVAL & MGMT  02/09/2017   IR SINUS/FIST TUBE CHK-NON GI  03/28/2017   LEFT HEART CATHETERIZATION WITH CORONARY ANGIOGRAM N/A 05/30/2012   Procedure: LEFT HEART CATHETERIZATION WITH CORONARY ANGIOGRAM;  Surgeon: Kathleene Hazel, MD;  Location: [facility] CATH LAB;  Service: Cardiovascular: Mild, non-obstructive CAD   TRANSTHORACIC ECHOCARDIOGRAM  09/14/2016; 11/2016   08/2016: EF 30-35 %, Akinesis of the mid-apicalanteroseptal myocardium.  Grade I DD.  Mild pulm HTN.  Repeat echo 11/2016, EF 40-45%, no rwma, nl RV fxn, mild TR, PASP .     TRANSTHORACIC ECHOCARDIOGRAM  03/07/2017   EF 50-55%. Hypokinesis of the distal septum with overall low normal LV,  systolic function; mild diastolic dysfunction with elevated LV  filling pressure; mildly calcified aortic valve with mild AI; small pericardial effusion   TRANSTHORACIC ECHOCARDIOGRAM     04/2022 EF 60-65%, no WMA, no valve probs    Immunization History  Administered Date(s) Administered    Fluad Quad(high Dose 65+) 08/29/2019, 10/13/2019, 10/28/2020, 07/20/2021, 08/04/2022   Influenza Split 07/06/2012   Influenza Whole 07/24/2006, 08/06/2007, 07/18/2008, 07/29/2009, 08/25/2010, 07/01/2011   Influenza, High Dose Seasonal PF 07/03/2015, 07/05/2016, 08/10/2017, 08/10/2018   Influenza,inj,Quad PF,6+ Mos 07/18/2013, 08/21/2014   Pneumococcal Conjugate-13 02/05/2011   Pneumococcal Polysaccharide-23 07/18/2008, 05/13/2014, 10/13/2019   Tdap 06/10/2017   Zoster, Live 04/07/2015    MEDICATIONS/ALLERGIES   Current Meds  Medication Sig   acetaminophen (TYLENOL) 500 MG tablet Take 1,000 mg by mouth daily as needed for headache.   albuterol (PROVENTIL) (2.5 MG/3ML) 0.083% nebulizer solution USE 1 VIAL IN NEBULIZER EVERY 4 HOURS AS NEEDED FOR WHEEZING AND FOR SHORTNESS OF BREATH (Patient not taking: Reported on 11/18/2022)   albuterol (VENTOLIN HFA) 108 (90 Base) MCG/ACT inhaler INHALE 2 PUFFS BY MOUTH EVERY 6 HOURS AS NEEDED FOR WHEEZING OR SHORTNESS OF BREATH   aspirin 81 MG EC tablet Take 81 mg by mouth daily.    budesonide-formoterol (SYMBICORT) 160-4.5 MCG/ACT inhaler Inhale 2 puffs into the lungs 2 (two) times daily.   Calcium Carb-Cholecalciferol (CALCIUM + VITAMIN D3 PO) Take 1-2 tablets by mouth See admin instructions. Take 1 tablet by mouth in the morning and 2 tablets by mouth at bedtime   cetirizine (ZYRTEC) 10 MG tablet Take 10 mg by mouth every evening.   guaiFENesin-dextromethorphan (ROBITUSSIN DM) 100-10 MG/5ML syrup Take 5 mLs by mouth every 4 (four) hours as needed for cough (chest congestion).   HUMULIN 70/30 KWIKPEN (70-30) 100 UNIT/ML KwikPen INJECT 4 UNITS SUBCUTANEOUSLY WITH BREAKFAST AND 2 UNITS WITH SUPPER   ibandronate (BONIVA) 150 MG tablet Take 1 tablet (150 mg total) by mouth every 30 (thirty) days. Take in the morning with a full glass of water, on an empty stomach, and do not take anything else by mouth or lie down for the next 30 min.   ketorolac (ACULAR)  0.5 % ophthalmic solution Place 1 drop into the left eye 4 (four) times daily.   Multiple Vitamins-Minerals (MULTIVITAMIN WOMEN PO) Take 1 tablet by mouth daily.   ONETOUCH ULTRA test strip See admin instructions.  pantoprazole (PROTONIX) 40 MG tablet Take 1 tablet (40 mg total) by mouth daily.   Spacer/Aero-Holding Chambers (AEROCHAMBER MV) inhaler Use as instructed   [DISCONTINUED] atorvastatin (LIPITOR) 40 MG tablet -> Plan will be to continue Take 1 tablet (40 mg total) by mouth daily. (Patient taking differently: Take 40 mg by mouth at bedtime.)   [DISCONTINUED] buPROPion (WELLBUTRIN XL) 300 MG 24 hr tablet Take 1 tablet by mouth once daily    Allergies  Allergen Reactions   Lactose Intolerance (Gi) Diarrhea   Motrin [Ibuprofen] Itching, Anxiety and Other (See Comments)    Anxiousness Hyperventilates     SOCIAL HISTORY/FAMILY HISTORY   Reviewed in Epic:  Pertinent findings:  Social History   Tobacco Use   Smoking status: Former    Packs/day: 1.00    Years: 45.00    Total pack years: 45.00    Types: Cigarettes    Quit date: 10/24/2008    Years since quitting: 14.0   Smokeless tobacco: Never  Vaping Use   Vaping Use: Never used  Substance Use Topics   Alcohol use: No    Alcohol/week: 0.0 standard drinks of alcohol   Drug use: No   Social History   Social History Narrative   Widow, lives in [place] with her daughter Alfonse Ras.   Husband died 2019-07-24 (esoph cancer, copd, chf).   No alcohol.    OBJCTIVE -PE, EKG, labs   Wt Readings from Last 3 Encounters:  11/18/22 142 lb 6.4 oz (64.6 kg)  11/15/22 143 lb 9.6 oz (65.1 kg)  11/03/22 142 lb (64.4 kg)    Physical Exam: BP 120/74   Pulse (!) 108   Ht 5\' 2"  (1.575 m)   Wt 143 lb 9.6 oz (65.1 kg)   SpO2 96%   BMI 26.26 kg/m  Physical Exam Constitutional:      Comments: Elderly female, NAD  HENT:     Mouth/Throat:     Comments: Mucous membranes tacky Neck:     Comments: No JVD Cardiovascular:      Rate and Rhythm: Regular rhythm. Tachycardia present.     Heart sounds: No murmur heard.    No friction rub. No gallop.  Pulmonary:     Effort: Pulmonary effort is normal.     Comments: Poor air movement, faint expiratory wheeze, no crackles Abdominal:     General: Abdomen is flat. There is no distension.     Palpations: Abdomen is soft.     Tenderness: There is no abdominal tenderness.  Musculoskeletal:     Right lower leg: No edema.     Left lower leg: No edema.  Skin:    General: Skin is warm and dry.     Capillary Refill: Capillary refill takes 2 to 3 seconds.    On exam she appears older than stated age.  She is certainly not volume overloaded but not obviously hypovolemic either on exam.  Mucous membranes are little bit dry, but she is a mouth breather.  She is less tachycardic on physical exam then her vital signs are indicate-I am counted her heart rate being 98 beats a minute.  With no obvious ectopy or arrhythmia.  Lungs [clear.   Adult ECG Report  Rate: 108 ;  Rhythm: sinus tachycardia;   Narrative Interpretation: LBBB stable from previous studies. QTc 525.  Personally reviewed with the resident physician, tracing signed to be scanned in chart.  QT prolongation and left bundle branch block is not that unusual.  Recent Labs:  Reviewed Lab Results  Component Value Date   CHOL 130 02/02/2022   HDL 52.10 02/02/2022   LDLCALC 55 02/02/2022   LDLDIRECT 162.0 07/17/2015   TRIG 111.0 02/02/2022   CHOLHDL 2 02/02/2022   Lab Results  Component Value Date   CREATININE 1.19 11/03/2022   BUN 22 11/03/2022   NA 138 11/03/2022   K 4.6 11/03/2022   CL 103 11/03/2022   CO2 27 11/03/2022      Latest Ref Rng & Units 11/03/2022   10:52 AM 10/18/2022    2:35 PM 09/02/2022   11:50 AM  CBC  WBC 4.0 - 10.5 K/uL 9.1  15.2  7.4   Hemoglobin 12.0 - 15.0 g/dL 50.0  93.8  18.2   Hematocrit 36.0 - 46.0 % 38.2  36.8  38.1   Platelets 150.0 - 400.0 K/uL 320.0  185  212.0     Lab  Results  Component Value Date   HGBA1C 7.4 (A) 11/18/2022   HGBA1C 7.4 11/18/2022   HGBA1C 7.4 (A) 11/18/2022   HGBA1C 7.4 (A) 11/18/2022   Lab Results  Component Value Date   TSH 1.30 10/03/2022    ================================================== Total time with the patient spent in direct patient consultation.  15 minutes-resident physician; additional 16 minutes with resident and attending. Additional time spent with chart review  / charting (studies, outside notes, etc): 12 min resident physician, additional 8 minutes with attending. Total Time: 51 min combined time  Current medicines are reviewed at length with the patient today.  (+/- concerns) N/A  Notice: This dictation was prepared with Dragon dictation along with smart phrase technology. Any transcriptional errors that result from this process are unintentional and may not be corrected upon review.  Studies Ordered:   Orders Placed This Encounter  Procedures   LONG TERM MONITOR (3-14 DAYS)   EKG 12-Lead   No orders of the defined types were placed in this encounter.   Patient Instructions / Medication Changes & Studies & Tests Ordered   Patient Instructions  Medication Instructions:  No changes    *If you need a refill on your cardiac medications before your next appointment, please call your pharmacy*   Lab Work:  Not needed  If you have labs (blood work) drawn today and your tests are completely normal, you will receive your results only by: MyChart Message (if you have MyChart) OR A paper copy in the mail If you have any lab test that is abnormal or we need to change your treatment, we will call you to review the results.   Testing/Procedures:  Will be mailed to your home in 3 to 7 days   Your physician has recommended that you wear a holter monitor 3 day Zio . Holter monitors are medical devices that record the heart's electrical activity. Doctors most often use these monitors to diagnose  arrhythmias. Arrhythmias are problems with the speed or rhythm of the heartbeat. The monitor is a small, portable device. You can wear one while you do your normal daily activities. This is usually used to diagnose what is causing palpitations/syncope (passing out).    Follow-Up: At [facility], you and your health needs are our priority.  As part of our continuing mission to provide you with exceptional heart care, we have created designated Provider Care Teams.  These Care Teams include your primary Cardiologist (physician) and Advanced Practice Providers (APPs -  Physician Assistants and Nurse Practitioners) who all work together to provide you with the care you  need, when you need it.  We recommend signing up for the patient portal called "MyChart".  Sign up information is provided on this After Visit Summary.  MyChart is used to connect with patients for Virtual Visits (Telemedicine).  Patients are able to view lab/test results, encounter notes, upcoming appointments, etc.  Non-urgent messages can be sent to your provider as well.   To learn more about what you can do with MyChart, go to [email or web address].    Your next appointment:   3 month(s)  The format for your next appointment:   Virtual Visit   Provider:   Bryan Lemma, MD    Other Instructions   Hydrate 60 oz to 70 oz  daily --  3 to 4 20 oz bottles   ZIO XT- Long Term Monitor Instructions provided   Signed,  Barbera Setters. Marisue Humble, MD Family [facility], R2  ATTENDING ATTESTATION  I have seen, examined and evaluated the patient along with the Resident Physician in clinic today.  I personally performed my own interview & exanimation.  After reviewing all the available data and chart, we discussed the patients laboratory, study & physical findings as well as symptoms in detail. I agree with his findings, examination as well as impression recommendations as per our discussion.    Attending adjustments int the full clinic noted  annotated in italics.      Marykay Lex, MD, MS Bryan Lemma, M.D., M.S. Interventional Cardiologist  [facility]  Pager # [phone] Phone # [phone] [address] [place], [place] [address]     Thank you for choosing [facility] [place]!!

## 2022-11-15 NOTE — Progress Notes (Unsigned)
Enrolled patient for a 7 day Zio XT monitor to be mailed to patients home.  

## 2022-11-15 NOTE — Patient Instructions (Addendum)
Medication Instructions:  No changes    *If you need a refill on your cardiac medications before your next appointment, please call your pharmacy*   Lab Work:  Not needed  If you have labs (blood work) drawn today and your tests are completely normal, you will receive your results only by: MyChart Message (if you have MyChart) OR A paper copy in the mail If you have any lab test that is abnormal or we need to change your treatment, we will call you to review the results.   Testing/Procedures:  Will be mailed to your home in 3 to 7 days   Your physician has recommended that you wear a holter monitor 3 day Zio . Holter monitors are medical devices that record the heart's electrical activity. Doctors most often use these monitors to diagnose arrhythmias. Arrhythmias are problems with the speed or rhythm of the heartbeat. The monitor is a small, portable device. You can wear one while you do your normal daily activities. This is usually used to diagnose what is causing palpitations/syncope (passing out).    Follow-Up: At [facility], you and your health needs are our priority.  As part of our continuing mission to provide you with exceptional heart care, we have created designated Provider Care Teams.  These Care Teams include your primary Cardiologist (physician) and Advanced Practice Providers (APPs -  Physician Assistants and Nurse Practitioners) who all work together to provide you with the care you need, when you need it.  We recommend signing up for the patient portal called "MyChart".  Sign up information is provided on this After Visit Summary.  MyChart is used to connect with patients for Virtual Visits (Telemedicine).  Patients are able to view lab/test results, encounter notes, upcoming appointments, etc.  Non-urgent messages can be sent to your provider as well.   To learn more about what you can do with MyChart, go to [email or web address].    Your next appointment:   3  month(s)  The format for your next appointment:   Virtual Visit   Provider:   Bryan Lemma, MD    Other Instructions   Hydrate 60 oz to 70 oz  daily --  3 to 4 20 oz bottles   ZIO XT- Long Term Monitor Instructions  Your physician has requested you wear a ZIO patch monitor for 3 days.  This is a single patch monitor. [employer] supplies one patch monitor per enrollment. Additional stickers are not available. Please do not apply patch if you will be having a Nuclear Stress Test,  Echocardiogram, Cardiac CT, MRI, or Chest Xray during the period you would be wearing the  monitor. The patch cannot be worn during these tests. You cannot remove and re-apply the  ZIO XT patch monitor.  Your ZIO patch monitor will be mailed 3 day USPS to your address on file. It may take 3-5 days  to receive your monitor after you have been enrolled.  Once you have received your monitor, please review the enclosed instructions. Your monitor  has already been registered assigning a specific monitor serial # to you.  Billing and Patient Assistance Program Information  We have supplied [employer] with any of your insurance information on file for billing purposes. [employer] offers a sliding scale Patient Assistance Program for patients that do not have  insurance, or whose insurance does not completely cover the cost of the ZIO monitor.  You must apply for the Patient Assistance Program to qualify for this discounted rate.  To apply, please call [employer] at [phone], select option 4, select option 2, ask to apply for  Patient Assistance Program. Meredeth Ide will ask your household income, and how many people  are in your household. They will quote your out-of-pocket cost based on that information.  [employer] will also be able to set up a 54-month, interest-free payment plan if needed.  Applying the monitor   Shave hair from upper left chest.  Hold abrader disc by orange tab. Rub abrader in 40 strokes over the  upper left chest as  indicated in your monitor instructions.  Clean area with 4 enclosed alcohol pads. Let dry.  Apply patch as indicated in monitor instructions. Patch will be placed under collarbone on left  side of chest with arrow pointing upward.  Rub patch adhesive wings for 2 minutes. Remove white label marked "1". Remove the white  label marked "2". Rub patch adhesive wings for 2 additional minutes.  While looking in a mirror, press and release button in center of patch. A small green light will  flash 3-4 times. This will be your only indicator that the monitor has been turned on.  Do not shower for the first 24 hours. You may shower after the first 24 hours.  Press the button if you feel a symptom. You will hear a small click. Record Date, Time and  Symptom in the Patient Logbook.  When you are ready to remove the patch, follow instructions on the last 2 pages of Patient  Logbook. Stick patch monitor onto the last page of Patient Logbook.  Place Patient Logbook in the blue and white box. Use locking tab on box and tape box closed  securely. The blue and white box has prepaid postage on it. Please place it in the mailbox as  soon as possible. Your physician should have your test results approximately 7 days after the  monitor has been mailed back to [facility].  Call [facility] Customer Care at [phone] if you have questions regarding  your ZIO XT patch monitor. Call them immediately if you see an orange light blinking on your  monitor.  If your monitor falls off in less than 4 days, contact our Monitor department at [phone].  If your monitor becomes loose or falls off after 4 days call [employer] at [phone] for  suggestions on securing your monitor

## 2022-11-17 DIAGNOSIS — R000 Tachycardia, unspecified: Secondary | ICD-10-CM | POA: Diagnosis not present

## 2022-11-17 DIAGNOSIS — R5382 Chronic fatigue, unspecified: Secondary | ICD-10-CM

## 2022-11-18 ENCOUNTER — Ambulatory Visit (INDEPENDENT_AMBULATORY_CARE_PROVIDER_SITE_OTHER): Payer: Medicare Other | Admitting: Family Medicine

## 2022-11-18 ENCOUNTER — Encounter: Payer: Self-pay | Admitting: Family Medicine

## 2022-11-18 VITALS — BP 99/65 | HR 105 | Temp 97.3°F | Ht 62.0 in | Wt 142.4 lb

## 2022-11-18 DIAGNOSIS — R000 Tachycardia, unspecified: Secondary | ICD-10-CM | POA: Diagnosis not present

## 2022-11-18 DIAGNOSIS — E118 Type 2 diabetes mellitus with unspecified complications: Secondary | ICD-10-CM | POA: Diagnosis not present

## 2022-11-18 LAB — POCT GLYCOSYLATED HEMOGLOBIN (HGB A1C)
HbA1c POC (<> result, manual entry): 7.4 % (ref 4.0–5.6)
HbA1c, POC (controlled diabetic range): 7.4 % — AB (ref 0.0–7.0)
HbA1c, POC (prediabetic range): 7.4 % — AB (ref 5.7–6.4)
Hemoglobin A1C: 7.4 % — AB (ref 4.0–5.6)

## 2022-11-18 MED ORDER — BUPROPION HCL ER (XL) 300 MG PO TB24: 300.0000 mg | ORAL_TABLET | Freq: Every day | ORAL | 1 refills | Status: DC

## 2022-11-18 MED ORDER — ATORVASTATIN CALCIUM 40 MG PO TABS: 40.0000 mg | ORAL_TABLET | Freq: Every day | ORAL | 1 refills | Status: DC

## 2022-11-18 NOTE — Progress Notes (Signed)
OFFICE VISIT  11/18/2022  CC:  Chief Complaint  Patient presents with   Medical Management of Chronic Issues    Pt is not fasting    Patient is a 77 y.o. female who presents for follow-up diabetes and tachycardia.  INTERIM HX: She saw Dr. Ellyn Hack on 11/15/2022 and he has ordered a Zio patch x 3 days. She is feeling fine, no palpitations or dizziness or acute weakness episodes.  Past Medical History:  Diagnosis Date   Acute renal failure (HCC)    Age-related nuclear cataract of both eyes 2016   +cortical age related cataracts OU    Anxiety and depression    Bilateral diabetic retinopathy ([place]) 2015   Dr. 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   Colonic diverticular abscess    COPD (chronic obstructive pulmonary disease) (HCC)    CVA (cerebral vascular accident) ([place]) 09/2016   MRI did show a left-sided small ischemic stroke which was acute but likely incidental (MRI was done b/c pt had TIA sx's in hospital).  Neuro put her on plavix at that time.  Cardiology d/c'd her plavix 08/2017.   Diabetes mellitus with complication (HCC)    diab retinopathy OU (laser)   Diverticulitis of large intestine with perforation and abscess 09/11/2016   Finger fracture, right 04/2020   R 5th metacarpal fx at the base (Dr. Mardelle Matte)   GERD (gastroesophageal reflux disease)    protonix   History of concussion 08/25/2018   w/out loss of consciousness--mild increase in baseline memory impairment after.  CT head neg acute.   History of diverticulitis of colon    with abscess; required IR percutaneous drain placed 09/2016.   History of iron deficiency anemia    started iron 07/02/19.  EGD/colonoscopy 08/27/19 unrevealing. Capsule endoscopy considered but never done.   Hyperlipidemia    Hypertension    Left bundle  branch block (LBBB) 12/16/2016   Lung cancer ([place])    non-small cell lung ca, stage III in 05/2011; systemic chemotherapy concurrent with radiation followed by prophylactic cranial irradiation and has been observation since July of 2010 with no evidence for disease recurrence-released from onc f/u 12/2014 (needs annual cxr by PCP).  CXR 08/2015 stable.  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 Earlie Server did bx and this was NEG for malignancy.   Mild cognitive impairment with memory loss    Likely from brain radiation therapy   Non-obstructive CAD (coronary artery disease)    a. Cath 2006 preserved LV fxn, scattered irregularities without critical stenosis; b. 2008 stress echo negative for ischemia, but with hypertensive response; c. 08/2016 Cath: D1 25%, otw nl.   Normocytic anemia 2018   Iron, vit B12, folate all normal 12/2016.   Open-angle glaucoma 2016   Dr. Shirley Muscat, (bilateral)---responding to topical therapy   Osteoarthritis of both hands    Osteoporosis 03/2016; 03/2018   03/2016 DEXA T-score -2.2.  03/2018 T-score L femoral neck  -2.7--boniva started. DEXA 04/2021 T scor -2.6->boniva continued. Plan rpt DEXA 2 yrs.   Pneumonia    hospitalization 11/2016; hypoxemic resp failure--d/c'd home with home oxygen therapy   Pulmonary nodule, left 08/2021   LUL 11 mm ground glass, needs f/u CT 08/2022.   Seasonal allergic rhinitis    Subclinical hyperthyroidism 2018   Takotsubo cardiomyopathy    a.  08/2016 Echo: EF 30-35%, gr1DD, PASP 9mmHg;  b. 08/2016 Cath: nonobs Dzs;  c. 11/2016 Echo: EF 40-45%, no rwma, nl RV fxn, mild TR, PASP 37mmHg.  ECHO 02/2017 EF 50-55%, grd II DD---essentially resolved Takotsubo 02/2017.   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 08/2016 in setting of diverticulitis & pneumoperitoneum and Takotsubo CM -->prolonged QT, seen by EP-->avoid meds with potential for QT prolongation.    Past Surgical History:  Procedure Laterality Date   ABDOMINAL HYSTERECTOMY  1997   APPENDECTOMY      CARDIAC CATHETERIZATION N/A 09/14/2016   Procedure: Left Heart Cath and Coronary Angiography;  Surgeon: Troy Sine, MD;  Location: [facility];  Service: Cardiovascular; Mild nonobstructive CAD with 85% smooth narrowing in the first diagonal branch of the LAD; 10% smooth narrowing of the ostial proximal left circumflex coronary artery; and a normal dominant RCA.   CARPAL TUNNEL RELEASE     CATARACT EXTRACTION Bilateral    CHOLECYSTECTOMY  2002   COLONOSCOPY  10/24/00; 08/27/19   2002 normal.  [employer] hemoccult testing via [facility] 06/18/15 was NEG.  2020->diverticulosis o/w nl.   dexa  03/2016; 03/2018   03/2018 T-score L femoral neck  -2.7 (worsened compared to osteopenic range 2017.) DEXA 05/03/21 stable T score -2.6 on boniva x 2 yrs->plan rpt DEXA 2 yrs.   ESOPHAGOGASTRODUODENOSCOPY  08/27/2019   Normal.   IR GENERIC HISTORICAL  09/19/2016   IR SINUS/FIST TUBE CHK-NON GI 09/19/2016 Corrie Mckusick, DO [facility]   IR GENERIC HISTORICAL  10/06/2016   IR RADIOLOGIST EVAL & MGMT 10/06/2016 [facility]   IR GENERIC HISTORICAL  10/26/2016   IR RADIOLOGIST EVAL & MGMT 10/26/2016 [facility]   IR GENERIC HISTORICAL  11/03/2016   IR RADIOLOGIST EVAL & MGMT 11/03/2016 Ardis Rowan, PA-C [facility]   IR GENERIC HISTORICAL  11/24/2016   IR RADIOLOGIST EVAL & MGMT 11/24/2016 Ardis Rowan, PA-C [facility]   IR GENERIC HISTORICAL  12/05/2016   Fistula smaller/improving.  IR SINUS/FIST TUBE CHK-NON GI 12/05/2016 Sandi Mariscal, MD [facility]   IR GENERIC HISTORICAL  12/22/2016   IR RADIOLOGIST EVAL & MGMT 12/22/2016 Greggory Keen, MD [facility]   IR RADIOLOGIST EVAL & MGMT  01/10/2017   IR RADIOLOGIST EVAL & MGMT  02/09/2017   IR SINUS/FIST TUBE CHK-NON GI  03/28/2017   LEFT HEART CATHETERIZATION WITH CORONARY ANGIOGRAM N/A 05/30/2012   Procedure: LEFT HEART CATHETERIZATION WITH CORONARY ANGIOGRAM;  Surgeon: Burnell Blanks, MD;  Location: [facility];  Service: Cardiovascular: Mild, non-obstructive CAD   TRANSTHORACIC ECHOCARDIOGRAM  09/14/2016; 11/2016   08/2016: EF 30-35 %, Akinesis of the mid-apicalanteroseptal myocardium.  Grade I DD.  Mild pulm HTN.  Repeat echo 11/2016, EF 40-45%, no rwma, nl RV fxn, mild TR, PASP 50mmHg.     TRANSTHORACIC ECHOCARDIOGRAM  03/07/2017   EF 50-55%. Hypokinesis of the distal septum with overall low normal LV,  systolic function; mild diastolic dysfunction with elevated LV  filling pressure; mildly calcified aortic valve with mild AI; small pericardial effusion   TRANSTHORACIC ECHOCARDIOGRAM     04/2022 EF 60-65%, no WMA, no valve probs    Outpatient Medications Prior to Visit  Medication Sig Dispense Refill   acetaminophen (TYLENOL) 500 MG tablet Take 1,000 mg by mouth daily as needed for headache.     albuterol (VENTOLIN HFA) 108 (90 Base) MCG/ACT inhaler INHALE 2 PUFFS BY MOUTH EVERY 6 HOURS AS NEEDED  FOR WHEEZING OR SHORTNESS OF BREATH 18 g 1   aspirin 81 MG EC tablet Take 81 mg by mouth daily.      budesonide-formoterol (SYMBICORT) 160-4.5 MCG/ACT inhaler Inhale 2 puffs into the lungs 2 (two) times daily. 1 each 6   Calcium Carb-Cholecalciferol (CALCIUM + VITAMIN D3 PO) Take 1-2 tablets by mouth See admin instructions. Take 1 tablet by mouth in the morning and 2 tablets by mouth at bedtime     cetirizine (ZYRTEC) 10 MG tablet Take 10 mg by mouth every evening.     HUMULIN 70/30 KWIKPEN (70-30) 100 UNIT/ML KwikPen INJECT 4 UNITS SUBCUTANEOUSLY WITH BREAKFAST AND 2 UNITS WITH SUPPER 15 mL 0   ibandronate (BONIVA) 150 MG tablet Take 1 tablet (150 mg total) by mouth every 30 (thirty) days. Take in the morning with a full glass of water, on an empty stomach, and do not take anything else by mouth or lie down for the next 30 min. 3 tablet 3   ketorolac (ACULAR) 0.5 % ophthalmic solution Place 1 drop into the left eye 4 (four) times daily.     Multiple Vitamins-Minerals (MULTIVITAMIN WOMEN PO) Take 1 tablet  by mouth daily.     ONETOUCH ULTRA test strip See admin instructions.     pantoprazole (PROTONIX) 40 MG tablet Take 1 tablet (40 mg total) by mouth daily. 90 tablet 1   prednisoLONE acetate (PRED FORTE) 1 % ophthalmic suspension Place 1 drop into the left eye.     Spacer/Aero-Holding Chambers (AEROCHAMBER MV) inhaler Use as instructed 1 each 2   albuterol (PROVENTIL) (2.5 MG/3ML) 0.083% nebulizer solution USE 1 VIAL IN NEBULIZER EVERY 4 HOURS AS NEEDED FOR WHEEZING AND FOR SHORTNESS OF BREATH (Patient not taking: Reported on 11/18/2022) 90 mL 1   guaiFENesin-dextromethorphan (ROBITUSSIN DM) 100-10 MG/5ML syrup Take 5 mLs by mouth every 4 (four) hours as needed for cough (chest congestion). 118 mL 0   atorvastatin (LIPITOR) 40 MG tablet Take 1 tablet (40 mg total) by mouth daily. (Patient taking differently: Take 40 mg by mouth at bedtime.) 90 tablet 1   buPROPion (WELLBUTRIN XL) 300 MG 24 hr tablet Take 1 tablet by mouth once daily 90 tablet 0   cefdinir (OMNICEF) 300 MG capsule 1 cap po bid x 5d 10 capsule 0   No facility-administered medications prior to visit.    Allergies  Allergen Reactions   Lactose Intolerance (Gi) Diarrhea   Motrin [Ibuprofen] Itching, Anxiety and Other (See Comments)    Anxiousness Hyperventilates     Review of Systems As per HPI  PE:    11/18/2022   10:07 AM 11/15/2022    2:58 PM 11/03/2022   10:09 AM  Vitals with BMI  Height 5\' 2"  5\' 2"    Weight 142 lbs 6 oz 143 lbs 10 oz 142 lbs  BMI 26.04 19.14 78.29  Systolic 99 562 130  Diastolic 65 74 67  Pulse 865 108 108     Physical Exam  Gen: Alert, well appearing.  Patient is oriented to person, place, time, and situation. AFFECT: pleasant, lucid thought and speech. Foot exam -  no swelling, tenderness or skin or vascular lesions. Color and temperature is normal. Sensation is intact. Peripheral pulses are palpable. Toenails are normal.   LABS:  Last CBC Lab Results  Component Value Date   WBC 9.1  11/03/2022   HGB 12.6 11/03/2022   HCT 38.2 11/03/2022   MCV 86.7 11/03/2022   MCH 28.6 10/18/2022   RDW  17.0 (H) 11/03/2022   PLT 320.0 16/38/4536   Last metabolic panel Lab Results  Component Value Date   GLUCOSE 268 (H) 11/03/2022   NA 138 11/03/2022   K 4.6 11/03/2022   CL 103 11/03/2022   CO2 27 11/03/2022   BUN 22 11/03/2022   CREATININE 1.19 11/03/2022   GFRNONAA 56 (L) 10/18/2022   CALCIUM 9.0 11/03/2022   PHOS 2.9 05/22/2022   PROT 6.1 (L) 10/18/2022   ALBUMIN 3.2 (L) 10/18/2022   LABGLOB 2.7 09/19/2018   AGRATIO 1.6 09/19/2018   BILITOT 0.6 10/18/2022   ALKPHOS 59 10/18/2022   AST 17 10/18/2022   ALT 12 10/18/2022   ANIONGAP 6 10/18/2022   Last lipids Lab Results  Component Value Date   CHOL 130 02/02/2022   HDL 52.10 02/02/2022   LDLCALC 55 02/02/2022   LDLDIRECT 162.0 07/17/2015   TRIG 111.0 02/02/2022   CHOLHDL 2 02/02/2022   Last hemoglobin A1c Lab Results  Component Value Date   HGBA1C 7.4 (A) 11/18/2022   HGBA1C 7.4 11/18/2022   HGBA1C 7.4 (A) 11/18/2022   HGBA1C 7.4 (A) 11/18/2022   Last thyroid functions Lab Results  Component Value Date   TSH 1.30 10/03/2022   T3TOTAL 94.7 04/20/2017   Last vitamin B12 and Folate Lab Results  Component Value Date   VITAMINB12 1,454 (H) 10/11/2019   FOLATE 14.2 10/11/2019   IMPRESSION AND PLAN:  #1 tachycardia, mild but persistent, unknown etiology.  Patient asymptomatic. She has seen Dr. Ellyn Hack, [facility] patch ordered, she started this today.  #2 diabetes with history of retinopathy. Point-of-care A1c today is 7.4%--- this is within her goal range of 7 to 8%.  Continue insulin 70/30, 4 units every morning and 2 units every afternoon. Feet exam today normal.  No other labs needed today.  An After Visit Summary was printed and given to the patient.  FOLLOW UP: Return in about 3 months (around 02/17/2023). Next CPE 04/2023  Signed:  Crissie Sickles, MD           11/18/2022

## 2022-11-19 ENCOUNTER — Encounter: Payer: Self-pay | Admitting: Cardiology

## 2022-11-19 DIAGNOSIS — R000 Tachycardia, unspecified: Secondary | ICD-10-CM | POA: Insufficient documentation

## 2022-11-19 NOTE — Assessment & Plan Note (Signed)
Will clarify at follow-up whether she is indeed taking Lipitor or not.

## 2022-11-19 NOTE — Assessment & Plan Note (Signed)
He does have a history of orthostatic hypotension which would lead me believe that she has a tendency to be somewhat volume down.  She clearly does not meet the goal of at least 60 if not 70 to 80 ounces of water a day.  Part of this reason is because of frequent urination.  Will allow for permissive hypertension and treat for hypertension per se.

## 2022-11-19 NOTE — Assessment & Plan Note (Signed)
With anything, she seems a little bit volume down.  No active CHF symptoms.

## 2022-11-19 NOTE — Assessment & Plan Note (Signed)
Previously documented nonocclusive disease-most recent catheter 2017  Aortic atherosclerosis demonstrated on recent CTA PE study. Last LDL 55 in 01/2022. - Continue Atorvastatin 40mg  daily (med list indicates that this was discontinued.  We can clarify at follow-up but recommend that she stays on a statin) - Continue ASA 81mg  daily

## 2022-11-19 NOTE — Progress Notes (Signed)
ATTENDING ATTESTATION  I have seen, examined and evaluated the patient along with the Resident Physician in clinic today.  I personally performed my own interview & exanimation.  After reviewing all the available data and chart, we discussed the patients laboratory, study & physical findings as well as symptoms in detail. I agree with his findings, examination as well as impression recommendations as per our discussion.    Attending adjustments int the full clinic noted annotated in [place].   Refer back to cardiology evaluation because of fatigue and sinus tachycardia. Things maybe little bit volume down today.  Certainly not volume overloaded but no signs of CHF.  Will be leery of trying to control heart rate as it may be respiratory for borderline blood pressures.  Will evaluate heart rate baseline with Zio patch monitor. Encourage adequate hydration.  Seen in follow-up.-Will need to assess whether she is or not is not taking statin.    Glenetta Hew, MD

## 2022-11-24 HISTORY — PX: OTHER SURGICAL HISTORY: SHX169

## 2022-11-30 DIAGNOSIS — R000 Tachycardia, unspecified: Secondary | ICD-10-CM | POA: Diagnosis not present

## 2022-11-30 DIAGNOSIS — R5382 Chronic fatigue, unspecified: Secondary | ICD-10-CM | POA: Diagnosis not present

## 2022-12-26 ENCOUNTER — Telehealth: Payer: Self-pay | Admitting: Family Medicine

## 2022-12-26 NOTE — Telephone Encounter (Signed)
Contacted [name] to schedule their annual wellness visit. Appointment made for 12/28/2022.  [place] Direct Dial [phone]

## 2022-12-28 ENCOUNTER — Ambulatory Visit (INDEPENDENT_AMBULATORY_CARE_PROVIDER_SITE_OTHER): Payer: Medicare Other

## 2022-12-28 VITALS — Wt 142.0 lb

## 2022-12-28 DIAGNOSIS — Z0000 Encounter for general adult medical examination without abnormal findings: Secondary | ICD-10-CM

## 2022-12-28 NOTE — Patient Instructions (Signed)
[name] , Thank you for taking time to come for your Medicare Wellness Visit. I appreciate your ongoing commitment to your health goals. Please review the following plan we discussed and let me know if I can assist you in the future.   These are the goals we discussed:  Goals      Increase physical activity     Increase walking     Patient Stated     Maintain current health.      Patient Stated     None at this times      Patient Stated     None at this time         This is a list of the screening recommended for you and due dates:  Health Maintenance  Topic Date Due   COVID-19 Vaccine (1) Never done   Yearly kidney health urinalysis for diabetes  08/11/2019   Zoster (Shingles) Vaccine (1 of 2) 02/02/2023*   Eye exam for diabetics  04/02/2023   Hemoglobin A1C  05/19/2023   Yearly kidney function blood test for diabetes  11/04/2023   Screening for Lung Cancer  11/11/2023   Complete foot exam   11/19/2023   Medicare Annual Wellness Visit  12/28/2023   Colon Cancer Screening  08/26/2024   DTaP/Tdap/Td vaccine (2 - Td or Tdap) 06/11/2027   Pneumonia Vaccine  Completed   Flu Shot  Completed   DEXA scan (bone density measurement)  Completed   Hepatitis C Screening: USPSTF Recommendation to screen - Ages 63-79 yo.  Completed   HPV Vaccine  Aged Out  *Topic was postponed. The date shown is not the original due date.    Advanced directives: copies in chart   Conditions/risks identified: none at this time   Next appointment: Follow up in one year for your annual wellness visit    Preventive Care 65 Years and Older, Female Preventive care refers to lifestyle choices and visits with your health care provider that can promote health and wellness. What does preventive care include? A yearly physical exam. This is also called an annual well check. Dental exams once or twice a year. Routine eye exams. Ask your health care provider how often you should have your eyes  checked. Personal lifestyle choices, including: Daily care of your teeth and gums. Regular physical activity. Eating a healthy diet. Avoiding tobacco and drug use. Limiting alcohol use. Practicing safe sex. Taking low-dose aspirin every day. Taking vitamin and mineral supplements as recommended by your health care provider. What happens during an annual well check? The services and screenings done by your health care provider during your annual well check will depend on your age, overall health, lifestyle risk factors, and family history of disease. Counseling  Your health care provider may ask you questions about your: Alcohol use. Tobacco use. Drug use. Emotional well-being. Home and relationship well-being. Sexual activity. Eating habits. History of falls. Memory and ability to understand (cognition). Work and work Statistician. Reproductive health. Screening  You may have the following tests or measurements: Height, weight, and BMI. Blood pressure. Lipid and cholesterol levels. These may be checked every 5 years, or more frequently if you are over 78 years old. Skin check. Lung cancer screening. You may have this screening every year starting at age 71 if you have a 30-pack-year history of smoking and currently smoke or have quit within the past 15 years. Fecal occult blood test (FOBT) of the stool. You may have this test every year starting  at age 58. Flexible sigmoidoscopy or colonoscopy. You may have a sigmoidoscopy every 5 years or a colonoscopy every 10 years starting at age 74. Hepatitis C blood test. Hepatitis B blood test. Sexually transmitted disease (STD) testing. Diabetes screening. This is done by checking your blood sugar (glucose) after you have not eaten for a while (fasting). You may have this done every 1-3 years. Bone density scan. This is done to screen for osteoporosis. You may have this done starting at age 35. Mammogram. This may be done every 1-2  years. Talk to your health care provider about how often you should have regular mammograms. Talk with your health care provider about your test results, treatment options, and if necessary, the need for more tests. Vaccines  Your health care provider may recommend certain vaccines, such as: Influenza vaccine. This is recommended every year. Tetanus, diphtheria, and acellular pertussis (Tdap, Td) vaccine. You may need a Td booster every 10 years. Zoster vaccine. You may need this after age 61. Pneumococcal 13-valent conjugate (PCV13) vaccine. One dose is recommended after age 56. Pneumococcal polysaccharide (PPSV23) vaccine. One dose is recommended after age 70. Talk to your health care provider about which screenings and vaccines you need and how often you need them. This information is not intended to replace advice given to you by your health care provider. Make sure you discuss any questions you have with your health care provider. Document Released: 11/06/2015 Document Revised: 06/29/2016 Document Reviewed: 08/11/2015 Elsevier Interactive Patient Education  2017 [place] Prevention in the Home Falls can cause injuries. They can happen to people of all ages. There are many things you can do to make your home safe and to help prevent falls. What can I do on the outside of my home? Regularly fix the edges of walkways and driveways and fix any cracks. Remove anything that might make you trip as you walk through a door, such as a raised step or threshold. Trim any bushes or trees on the path to your home. Use bright outdoor lighting. Clear any walking paths of anything that might make someone trip, such as rocks or tools. Regularly check to see if handrails are loose or broken. Make sure that both sides of any steps have handrails. Any raised decks and porches should have guardrails on the edges. Have any leaves, snow, or ice cleared regularly. Use sand or salt on walking paths  during winter. Clean up any spills in your garage right away. This includes oil or grease spills. What can I do in the bathroom? Use night lights. Install grab bars by the toilet and in the tub and shower. Do not use towel bars as grab bars. Use non-skid mats or decals in the tub or shower. If you need to sit down in the shower, use a plastic, non-slip stool. Keep the floor dry. Clean up any water that spills on the floor as soon as it happens. Remove soap buildup in the tub or shower regularly. Attach bath mats securely with double-sided non-slip rug tape. Do not have throw rugs and other things on the floor that can make you trip. What can I do in the bedroom? Use night lights. Make sure that you have a light by your bed that is easy to reach. Do not use any sheets or blankets that are too big for your bed. They should not hang down onto the floor. Have a firm chair that has side arms. You can use this for support  while you get dressed. Do not have throw rugs and other things on the floor that can make you trip. What can I do in the kitchen? Clean up any spills right away. Avoid walking on wet floors. Keep items that you use a lot in easy-to-reach places. If you need to reach something above you, use a strong step stool that has a grab bar. Keep electrical cords out of the way. Do not use floor polish or wax that makes floors slippery. If you must use wax, use non-skid floor wax. Do not have throw rugs and other things on the floor that can make you trip. What can I do with my stairs? Do not leave any items on the stairs. Make sure that there are handrails on both sides of the stairs and use them. Fix handrails that are broken or loose. Make sure that handrails are as long as the stairways. Check any carpeting to make sure that it is firmly attached to the stairs. Fix any carpet that is loose or worn. Avoid having throw rugs at the top or bottom of the stairs. If you do have throw  rugs, attach them to the floor with carpet tape. Make sure that you have a light switch at the top of the stairs and the bottom of the stairs. If you do not have them, ask someone to add them for you. What else can I do to help prevent falls? Wear shoes that: Do not have high heels. Have rubber bottoms. Are comfortable and fit you well. Are closed at the toe. Do not wear sandals. If you use a stepladder: Make sure that it is fully opened. Do not climb a closed stepladder. Make sure that both sides of the stepladder are locked into place. Ask someone to hold it for you, if possible. Clearly mark and make sure that you can see: Any grab bars or handrails. First and last steps. Where the edge of each step is. Use tools that help you move around (mobility aids) if they are needed. These include: Canes. Walkers. Scooters. Crutches. Turn on the lights when you go into a dark area. Replace any light bulbs as soon as they burn out. Set up your furniture so you have a clear path. Avoid moving your furniture around. If any of your floors are uneven, fix them. If there are any pets around you, be aware of where they are. Review your medicines with your doctor. Some medicines can make you feel dizzy. This can increase your chance of falling. Ask your doctor what other things that you can do to help prevent falls. This information is not intended to replace advice given to you by your health care provider. Make sure you discuss any questions you have with your health care provider. Document Released: 08/06/2009 Document Revised: 03/17/2016 Document Reviewed: 11/14/2014 Elsevier Interactive Patient Education  2017 [employer].

## 2022-12-28 NOTE — Progress Notes (Signed)
I connected with  [name] on 12/28/22 by a audio enabled telemedicine application and verified that I am speaking with the correct person using two identifiers. Along with [name] her daughter   Patient Location: Home  Provider Location: Home Office  I discussed the limitations of evaluation and management by telemedicine. The patient expressed understanding and agreed to proceed.   Subjective:   [name] is a 77 y.o. female who presents for Medicare Annual (Subsequent) preventive examination.  Review of Systems     Cardiac Risk Factors include: advanced age (>58mn, >>37women);hypertension;dyslipidemia;diabetes mellitus;sedentary lifestyle     Objective:    Today's Vitals   12/28/22 1523  Weight: 142 lb (64.4 kg)   Body mass index is 25.97 kg/m.     12/28/2022    3:28 PM 10/18/2022   12:26 PM 08/10/2022    9:26 AM 07/14/2022    1:07 PM 05/21/2022    3:48 PM 03/06/2022    8:00 PM 12/22/2021    3:13 PM  Advanced Directives  Does Patient Have a Medical Advance Directive? Yes Unable to assess, patient is non-responsive or altered mental status No No Yes Yes;No Yes  Type of [place]aramed[place]of [place] will    Healthcare Power of [place] Does patient want to make changes to medical advance directive? No - Patient declined  No - Patient declined      Copy of [place] Chart? Yes - validated most recent copy scanned in chart (See row information)      Yes - validated most recent copy scanned in chart (See row information)  Would patient like information on creating a medical advance directive?    No - Patient declined       Current Medications (verified) Outpatient Encounter Medications as of 12/28/2022  Medication Sig   acetaminophen (TYLENOL) 500 MG tablet Take 1,000 mg by mouth daily as needed for headache.   albuterol (PROVENTIL) (2.5 MG/3ML) 0.083% nebulizer solution USE 1 VIAL  IN NEBULIZER EVERY 4 HOURS AS NEEDED FOR WHEEZING AND FOR SHORTNESS OF BREATH   albuterol (VENTOLIN HFA) 108 (90 Base) MCG/ACT inhaler INHALE 2 PUFFS BY MOUTH EVERY 6 HOURS AS NEEDED FOR WHEEZING OR SHORTNESS OF BREATH   aspirin 81 MG EC tablet Take 81 mg by mouth daily.    atorvastatin (LIPITOR) 40 MG tablet Take 1 tablet (40 mg total) by mouth daily.   budesonide-formoterol (SYMBICORT) 160-4.5 MCG/ACT inhaler Inhale 2 puffs into the lungs 2 (two) times daily.   buPROPion (WELLBUTRIN XL) 300 MG 24 hr tablet Take 1 tablet (300 mg total) by mouth daily.   Calcium Carb-Cholecalciferol (CALCIUM + VITAMIN D3 PO) Take 1-2 tablets by mouth See admin instructions. Take 1 tablet by mouth in the morning and 2 tablets by mouth at bedtime   cetirizine (ZYRTEC) 10 MG tablet Take 10 mg by mouth every evening.   guaiFENesin-dextromethorphan (ROBITUSSIN DM) 100-10 MG/5ML syrup Take 5 mLs by mouth every 4 (four) hours as needed for cough (chest congestion).   HUMULIN 70/30 KWIKPEN (70-30) 100 UNIT/ML KwikPen INJECT 4 UNITS SUBCUTANEOUSLY WITH BREAKFAST AND 2 UNITS WITH SUPPER   ibandronate (BONIVA) 150 MG tablet Take 1 tablet (150 mg total) by mouth every 30 (thirty) days. Take in the morning with a full glass of water, on an empty stomach, and do not take anything else by mouth or lie down for the next 30 min.   ketorolac (ACULAR)  0.5 % ophthalmic solution Place 1 drop into the left eye 4 (four) times daily.   Multiple Vitamins-Minerals (MULTIVITAMIN WOMEN PO) Take 1 tablet by mouth daily.   ONETOUCH ULTRA test strip See admin instructions.   pantoprazole (PROTONIX) 40 MG tablet Take 1 tablet (40 mg total) by mouth daily.   prednisoLONE acetate (PRED FORTE) 1 % ophthalmic suspension Place 1 drop into the left eye.   Spacer/Aero-Holding Chambers (AEROCHAMBER MV) inhaler Use as instructed   No facility-administered encounter medications on file as of 12/28/2022.    Allergies (verified) Lactose intolerance (gi)  and Motrin [ibuprofen]   History: Past Medical History:  Diagnosis Date   Acute renal failure (HCC)    Age-related nuclear cataract of both eyes 2016   +cortical age related cataracts OU    Anxiety and depression    Bilateral diabetic retinopathy ([place]) 2015   Dr. 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   Colonic diverticular abscess    COPD (chronic obstructive pulmonary disease) (HCC)    CVA (cerebral vascular accident) ([place]) 09/2016   MRI did show a left-sided small ischemic stroke which was acute but likely incidental (MRI was done b/c pt had TIA sx's in hospital).  Neuro put her on plavix at that time.  Cardiology d/c'd her plavix 08/2017.   Diabetes mellitus with complication (HCC)    diab retinopathy OU (laser)   Diverticulitis of large intestine with perforation and abscess 09/11/2016   Finger fracture, right 04/2020   R 5th metacarpal fx at the base (Dr. Mardelle Matte)   GERD (gastroesophageal reflux disease)    protonix   History of concussion 08/25/2018   w/out loss of consciousness--mild increase in baseline memory impairment after.  CT head neg acute.   History of diverticulitis of colon    with abscess; required IR percutaneous drain placed 09/2016.   History of iron deficiency anemia    started iron 07/02/19.  EGD/colonoscopy 08/27/19 unrevealing. Capsule endoscopy considered but never done.   Hyperlipidemia    Hypertension    Left bundle branch block (LBBB) 12/16/2016   Lung cancer ([employer])    non-small cell lung ca, stage III in 05/2011; systemic chemotherapy concurrent with radiation followed by prophylactic cranial irradiation and has been observation since July of 2010 with no evidence for disease recurrence-released from onc f/u 12/2014 (needs annual cxr by PCP).  CXR 08/2015 stable.  CT  07/2017 with ? sternal met--Dr. Earlie Server did bx and this was NEG for malignancy.   Mild cognitive impairment with memory loss    Likely from brain radiation therapy   Non-obstructive CAD (coronary artery disease)    a. Cath 2006 preserved LV fxn, scattered irregularities without critical stenosis; b. 2008 stress echo negative for ischemia, but with hypertensive response; c. 08/2016 Cath: D1 25%, otw nl.   Normocytic anemia 2018   Iron, vit B12, folate all normal 12/2016.   Open-angle glaucoma 2016   Dr. Shirley Muscat, (bilateral)---responding to topical therapy   Osteoarthritis of both hands    Osteoporosis 03/2016; 03/2018   03/2016 DEXA T-score -2.2.  03/2018 T-score L femoral neck  -2.7--boniva started. DEXA 04/2021 T scor -2.6->boniva continued. Plan rpt DEXA 2 yrs.   Pneumonia    hospitalization 11/2016; hypoxemic resp failure--d/c'd home with home oxygen therapy   Pulmonary nodule, left  08/2021   LUL 11 mm ground glass, needs f/u CT 08/2022.   Seasonal allergic rhinitis    Subclinical hyperthyroidism 2018   Takotsubo cardiomyopathy    a. 08/2016 Echo: EF 30-35%, gr1DD, PASP 15mHg;  b. 08/2016 Cath: nonobs Dzs;  c. 11/2016 Echo: EF 40-45%, no rwma, nl RV fxn, mild TR, PASP 367mg.  ECHO 02/2017 EF 50-55%, grd II DD---essentially resolved Takotsubo 02/2017.   [place] de pointes ([place]   a. 08/2016 in setting of diverticulitis & pneumoperitoneum and Takotsubo CM -->prolonged QT, seen by EP-->avoid meds with potential for QT prolongation.   Past Surgical History:  Procedure Laterality Date   ABDOMINAL HYSTERECTOMY  1997   APPENDECTOMY     CARDIAC CATHETERIZATION N/A 09/14/2016   Procedure: Left Heart Cath and Coronary Angiography;  Surgeon: ThTroy SineMD;  [facility];  Service: Cardiovascular; Mild nonobstructive CAD with 85% smooth narrowing in the first diagonal branch of the LAD; 10% smooth narrowing of the ostial proximal left circumflex coronary artery; and a  normal dominant RCA.   CARPAL TUNNEL RELEASE     CATARACT EXTRACTION Bilateral    CHOLECYSTECTOMY  2002   COLONOSCOPY  10/24/00; 08/27/19   2002 normal.  [facility] hemoccult testing via [facility] 06/18/15 was NEG.  2020->diverticulosis o/w nl.   dexa  03/2016; 03/2018   03/2018 T-score L femoral neck  -2.7 (worsened compared to osteopenic range 2017.) DEXA 05/03/21 stable T score -2.6 on boniva x 2 yrs->plan rpt DEXA 2 yrs.   ESOPHAGOGASTRODUODENOSCOPY  08/27/2019   Normal.   IR GENERIC HISTORICAL  09/19/2016   IR SINUS/FIST TUBE CHK-NON GI 09/19/2016 JaCorrie MckusickDO [facility]   IR GENERIC HISTORICAL  10/06/2016   IR RADIOLOGIST EVAL & MGMT 10/06/2016 [facility]   IR GENERIC HISTORICAL  10/26/2016   IR RADIOLOGIST EVAL & MGMT 10/26/2016 [facility]   IR GENERIC HISTORICAL  11/03/2016   IR RADIOLOGIST EVAL & MGMT 11/03/2016 WeArdis RowanPA-C [facility]   IR GENERIC HISTORICAL  11/24/2016   IR RADIOLOGIST EVAL & MGMT 11/24/2016 WeArdis RowanPA-C [facility]   IR GENERIC HISTORICAL  12/05/2016   Fistula smaller/improving.  IR SINUS/FIST TUBE CHK-NON GI 12/05/2016 JoSandi MariscalMD [facility]   IR GENERIC HISTORICAL  12/22/2016   IR RADIOLOGIST EVAL & MGMT 12/22/2016 MiGreggory KeenMD [facility]   IR RADIOLOGIST EVAL & MGMT  01/10/2017   IR RADIOLOGIST EVAL & MGMT  02/09/2017   IR SINUS/FIST TUBE CHK-NON GI  03/28/2017   LEFT HEART CATHETERIZATION WITH CORONARY ANGIOGRAM N/A 05/30/2012   Procedure: LEFT HEART CATHETERIZATION WITH CORONARY ANGIOGRAM;  Surgeon: ChBurnell BlanksMD;  [facility];  Service: Cardiovascular: Mild, non-obstructive CAD   TRANSTHORACIC ECHOCARDIOGRAM  09/14/2016; 11/2016   08/2016: EF 30-35 %, Akinesis of the mid-apicalanteroseptal myocardium.  Grade I DD.  Mild pulm HTN.  Repeat echo 11/2016, EF 40-45%, no rwma, nl RV fxn, mild TR, PASP 3736m.     TRANSTHORACIC ECHOCARDIOGRAM  03/07/2017   EF 50-55%. Hypokinesis  of the distal septum with overall low normal LV,  systolic function; mild diastolic dysfunction with elevated LV  filling pressure; mildly calcified aortic valve with mild AI; small pericardial effusion   TRANSTHORACIC ECHOCARDIOGRAM     04/2022 EF 60-65%, no WMA, no valve probs   Family History  Problem Relation Age of Onset   Heart failure Mother    Kidney disease Mother  renal failure   Diabetes Mother    Diabetes Father    Diabetes Brother    Heart disease Brother    Heart attack Brother    Heart attack Brother    Heart attack Brother    Pancreatic cancer Brother    Colon cancer Neg Hx    Esophageal cancer Neg Hx    Stomach cancer Neg Hx    Rectal cancer Neg Hx    Social History   Socioeconomic History   Marital status: Widowed    Spouse name: Not on file   Number of children: Not on file   Years of education: Not on file   Highest education level: Not on file  Occupational History   Occupation: Retired Public affairs consultant: UNEMPLOYED  Tobacco Use   Smoking status: Former    Packs/day: 1.00    Years: 45.00    Total pack years: 45.00    Types: Cigarettes    Quit date: 10/24/2008    Years since quitting: 14.1   Smokeless tobacco: Never  Vaping Use   Vaping Use: Never used  Substance and Sexual Activity   Alcohol use: No    Alcohol/week: 0.0 standard drinks of alcohol   Drug use: No   Sexual activity: Not on file  Other Topics Concern   Not on file  Social History Narrative   Widow, lives in [place] with her daughter [name].   Husband died Aug 01, 2019 (esoph cancer, copd, chf).   No alcohol.   Social Determinants of Health   Financial Resource Strain: Low Risk  (12/28/2022)   Overall Financial Resource Strain (CARDIA)    Difficulty of Paying Living Expenses: Not hard at all  Food Insecurity: No Food Insecurity (12/28/2022)   Hunger Vital Sign    Worried About Running Out of Food in the Last Year: Never true    Ran Out of Food in the Last  Year: Never true  Transportation Needs: No Transportation Needs (12/28/2022)   PRAPARE - Hydrologist (Medical): No    Lack of Transportation (Non-Medical): No  Physical Activity: Inactive (12/28/2022)   Exercise Vital Sign    Days of Exercise per Week: 0 days    Minutes of Exercise per Session: 0 min  Stress: No Stress Concern Present (12/28/2022)   [place]    Feeling of Stress : Not at all  Social Connections: Socially Isolated (12/28/2022)   Social Connection and Isolation Panel [NHANES]    Frequency of Communication with Friends and Family: Never    Frequency of Social Gatherings with Friends and Family: Once a week    Attends Religious Services: Never    Marine scientist or Organizations: No    Attends Archivist Meetings: Never    Marital Status: Widowed    Tobacco Counseling Counseling given: Not Answered   Clinical Intake:  Pre-visit preparation completed: Yes  Pain : No/denies pain     BMI - recorded: 25.97 Nutritional Status: BMI 25 -29 Overweight Nutritional Risks: None Diabetes: Yes CBG done?: Yes (per pt 139) CBG resulted in Enter/ Edit results?: No Did pt. bring in CBG monitor from home?: No  How often do you need to have someone help you when you read instructions, pamphlets, or other written materials from your doctor or pharmacy?: 1 - Never  Diabetic?Nutrition Risk Assessment:  Has the patient had any N/V/D within the last 2 months?  No  Does the patient have any non-healing wounds?  No  Has the patient had any unintentional weight loss or weight gain?  No   Diabetes:  Is the patient diabetic?  Yes  If diabetic, was a CBG obtained today?  Yes  Did the patient bring in their glucometer from home?  No  How often do you monitor your CBG's? Twice a day .   Financial Strains and Diabetes Management:  Are you having any financial strains with  the device, your supplies or your medication? No .  Does the patient want to be seen by Chronic Care Management for management of their diabetes?  No  Would the patient like to be referred to a Nutritionist or for Diabetic Management?  No   Diabetic Exams:  Diabetic Eye Exam: Completed 04/01/21 Diabetic Foot Exam: Completed 11/18/22  Interpreter Needed?: No  Information entered by :: Charlott Rakes, LPN   Activities of Daily Living    12/28/2022    3:29 PM 05/21/2022   11:00 PM  In your present state of health, do you have any difficulty performing the following activities:  Hearing? 0 0  Vision? 0 0  Difficulty concentrating or making decisions? 0 0  Walking or climbing stairs? 1 0  Dressing or bathing? 0 0  Doing errands, shopping? 0 1  Preparing Food and eating ? Y   Comment daughter fixes food   Using the Toilet? N   In the past six months, have you accidently leaked urine? Y   Do you have problems with loss of bowel control? Y   Comment wears a adult diapers   Managing your Medications? N   Managing your Finances? N   Housekeeping or managing your Housekeeping? Y   Comment with daughter     Patient Care Team: [name], MD as PCP - General (Family Medicine) Leonie Man, MD as PCP - Cardiology (Cardiology) Rigoberto Noel, MD as Consulting Physician (Pulmonary Disease) Curt Bears, MD as Consulting Physician (Oncology) Hayden Pedro, MD as Consulting Physician (Ophthalmology) Almyra Deforest, [place] as Consulting Physician (Cardiology) Leonie Man, MD as Consulting Physician (Cardiology) Ralene Ok, MD as Consulting Physician (General Surgery)  Indicate any recent Medical Services you may have received from other than [facility] providers in the past year (date may be approximate).     Assessment:   This is a routine wellness examination for [place].  Hearing/Vision screen Hearing Screening - Comments:: Pt denies any hearing issues  Vision Screening  - Comments:: Pt follows up with Dr Zigmund Daniel for annual eye exams   Dietary issues and exercise activities discussed: Current Exercise Habits: The patient does not participate in regular exercise at present   Goals Addressed             This Visit's Progress    Patient Stated       None at this time        Depression Screen    12/28/2022    3:26 PM 08/04/2022    3:24 PM 12/22/2021    3:11 PM 08/02/2021    2:16 PM 03/23/2021    3:30 PM 12/16/2020    3:49 PM 08/29/2019    3:35 PM  PHQ 2/9 Scores  PHQ - 2 Score 0 3 1 0  0 1  PHQ- 9 Score       9  Exception Documentation     Other- indicate reason in comment box      Fall Risk  12/28/2022    3:28 PM 10/25/2022    8:12 AM 02/02/2022    9:54 AM 12/22/2021    3:14 PM 08/02/2021    2:16 PM  Fall Risk   Falls in the past year? '1 1 1 1 '$ 0  Number falls in past yr: 1 0 0 1 0  Injury with Fall? 0 0 1 1 0  Risk for fall due to : Impaired balance/gait;Impaired mobility;Impaired vision History of fall(s) History of fall(s);Impaired balance/gait;Impaired mobility Impaired balance/gait;Impaired mobility;Impaired vision;History of fall(s)   Follow up Falls prevention discussed Falls evaluation completed Falls evaluation completed  Falls evaluation completed    [place]:  Any stairs in or around the home? Yes  If so, are there any without handrails? No  Home free of loose throw rugs in walkways, pet beds, electrical cords, etc? Yes  Adequate lighting in your home to reduce risk of falls? Yes   ASSISTIVE DEVICES UTILIZED TO PREVENT FALLS:  Life alert? No  Use of a cane, walker or w/c? Yes  Grab bars in the bathroom? Yes  Shower chair or bench in shower? Yes  Elevated toilet seat or a handicapped toilet? No   TIMED UP AND GO:  Was the test performed? No .  Cognitive Function:    11/12/2018   10:18 AM 11/10/2017   11:03 AM 07/05/2016    8:42 AM  MMSE - Mini Mental State Exam  Orientation to time '4  5 2  '$ Orientation to Place '5 5 5  '$ Registration '3 3 3  '$ Attention/ Calculation '3 3 5  '$ Recall 0 2 3  Language- name 2 objects '2 2 2  '$ Language- repeat '1 1 1  '$ Language- follow 3 step command '3 3 3  '$ Language- read & follow direction '1 1 1  '$ Write a sentence '1 1 1  '$ Copy design '1 1 1  '$ Total score '24 27 27        '$ 12/28/2022    3:30 PM 12/22/2021    3:17 PM 12/16/2020    3:54 PM  6CIT Screen  What Year? 4 points 4 points 0 points  What month? 3 points 3 points 3 points  What time? 3 points 3 points 0 points  Count back from 20 0 points 0 points 0 points  Months in reverse 4 points 4 points 2 points  Repeat phrase 10 points 0 points 2 points  Total Score 24 points 14 points 7 points    Immunizations Immunization History  Administered Date(s) Administered   Fluad Quad(high Dose 65+) 08/29/2019, 10/13/2019, 10/28/2020, 07/20/2021, 08/04/2022   Influenza Split 07/06/2012   Influenza Whole 07/24/2006, 08/06/2007, 07/18/2008, 07/29/2009, 08/25/2010, 07/01/2011   Influenza, High Dose Seasonal PF 07/03/2015, 07/05/2016, 08/10/2017, 08/10/2018   Influenza,inj,Quad PF,6+ Mos 07/18/2013, 08/21/2014   Pneumococcal Conjugate-13 02/05/2011   Pneumococcal Polysaccharide-23 07/18/2008, 05/13/2014, 10/13/2019   Tdap 06/10/2017   Zoster, Live 04/07/2015    TDAP status: Up to date  Flu Vaccine status: Up to date  Pneumococcal vaccine status: Up to date  Covid-19 vaccine status: Declined, Education has been provided regarding the importance of this vaccine but patient still declined. Advised may receive this vaccine at local pharmacy or Health Dept.or vaccine clinic. Aware to provide a copy of the vaccination record if obtained from local pharmacy or Health Dept. Verbalized acceptance and understanding.  Qualifies for Shingles Vaccine? Yes   Zostavax completed No   Shingrix Completed?: No.    Education has been provided regarding the  importance of this vaccine. Patient has been advised to call  insurance company to determine out of pocket expense if they have not yet received this vaccine. Advised may also receive vaccine at local pharmacy or Health Dept. Verbalized acceptance and understanding.  Screening Tests Health Maintenance  Topic Date Due   COVID-19 Vaccine (1) Never done   Diabetic kidney evaluation - Urine ACR  08/11/2019   Zoster Vaccines- Shingrix (1 of 2) 02/02/2023 (Originally 06/22/1965)   OPHTHALMOLOGY EXAM  04/02/2023   HEMOGLOBIN A1C  05/19/2023   Diabetic kidney evaluation - eGFR measurement  11/04/2023   Lung Cancer Screening  11/11/2023   FOOT EXAM  11/19/2023   Medicare Annual Wellness (AWV)  12/28/2023   COLONOSCOPY (Pts 45-29yr Insurance coverage will need to be confirmed)  08/26/2024   DTaP/Tdap/Td (2 - Td or Tdap) 06/11/2027   Pneumonia Vaccine 75 Years old  Completed   INFLUENZA VACCINE  Completed   DEXA SCAN  Completed   Hepatitis C Screening  Completed   HPV VACCINES  Aged Out    Health Maintenance  Health Maintenance Due  Topic Date Due   COVID-19 Vaccine (1) Never done   Diabetic kidney evaluation - Urine ACR  08/11/2019    Colorectal cancer screening: Type of screening: Colonoscopy. Completed 08/27/19. Repeat every 5 years  Mammogram status: Completed 05/03/21. Repeat every year  Bone Density status: Completed 05/03/21. Results reflect: Bone density results: OSTEOPOROSIS. Repeat every 2 years.  Lung Cancer Screening: (Low Dose CT Chest recommended if Age 75-80years, 30 pack-year currently smoking OR have quit w/in 15years.) does qualify.   Lung Cancer Screening Referral: 11/10/22 completed   Additional Screening:  Hepatitis C Screening: Completed 03/03/16  Vision Screening: Recommended annual ophthalmology exams for early detection of glaucoma and other disorders of the eye. Is the patient up to date with their annual eye exam?  Yes  Who is the provider or what is the name of the office in which the patient attends annual eye  exams? Dr MZigmund Daniel If pt is not established with a provider, would they like to be referred to a provider to establish care? No .   Dental Screening: Recommended annual dental exams for proper oral hygiene  Community Resource Referral / Chronic Care Management: CRR required this visit?  No   CCM required this visit?  No      Plan:     I have personally reviewed and noted the following in the patient's chart:   Medical and social history Use of alcohol, tobacco or illicit drugs  Current medications and supplements including opioid prescriptions. Patient is not currently taking opioid prescriptions. Functional ability and status Nutritional status Physical activity Advanced directives List of other physicians Hospitalizations, surgeries, and ER visits in previous 12 months Vitals Screenings to include cognitive, depression, and falls Referrals and appointments  In addition, I have reviewed and discussed with patient certain preventive protocols, quality metrics, and best practice recommendations. A written personalized care plan for preventive services as well as general preventive health recommendations were provided to patient.     TWillette Brace LPN   [id number]  Nurse Notes: pt scored 24 on cognition exam. Stated that she was unable to recite or recall questions asked

## 2022-12-30 ENCOUNTER — Encounter (INDEPENDENT_AMBULATORY_CARE_PROVIDER_SITE_OTHER): Payer: Medicare Other | Admitting: Ophthalmology

## 2022-12-30 DIAGNOSIS — E113591 Type 2 diabetes mellitus with proliferative diabetic retinopathy without macular edema, right eye: Secondary | ICD-10-CM | POA: Diagnosis not present

## 2022-12-30 DIAGNOSIS — I10 Essential (primary) hypertension: Secondary | ICD-10-CM

## 2022-12-30 DIAGNOSIS — H43813 Vitreous degeneration, bilateral: Secondary | ICD-10-CM | POA: Diagnosis not present

## 2022-12-30 DIAGNOSIS — H35372 Puckering of macula, left eye: Secondary | ICD-10-CM | POA: Diagnosis not present

## 2022-12-30 DIAGNOSIS — E113512 Type 2 diabetes mellitus with proliferative diabetic retinopathy with macular edema, left eye: Secondary | ICD-10-CM

## 2022-12-30 DIAGNOSIS — H35033 Hypertensive retinopathy, bilateral: Secondary | ICD-10-CM

## 2023-01-07 IMAGING — DX DG CHEST 1V PORT
1 series · 1 of 1 positions shown · non-contrast
Comparison: Chest radiograph dated October 19, 2021

CLINICAL DATA: cough

EXAM:
PORTABLE CHEST 1 VIEW

[chest ap]
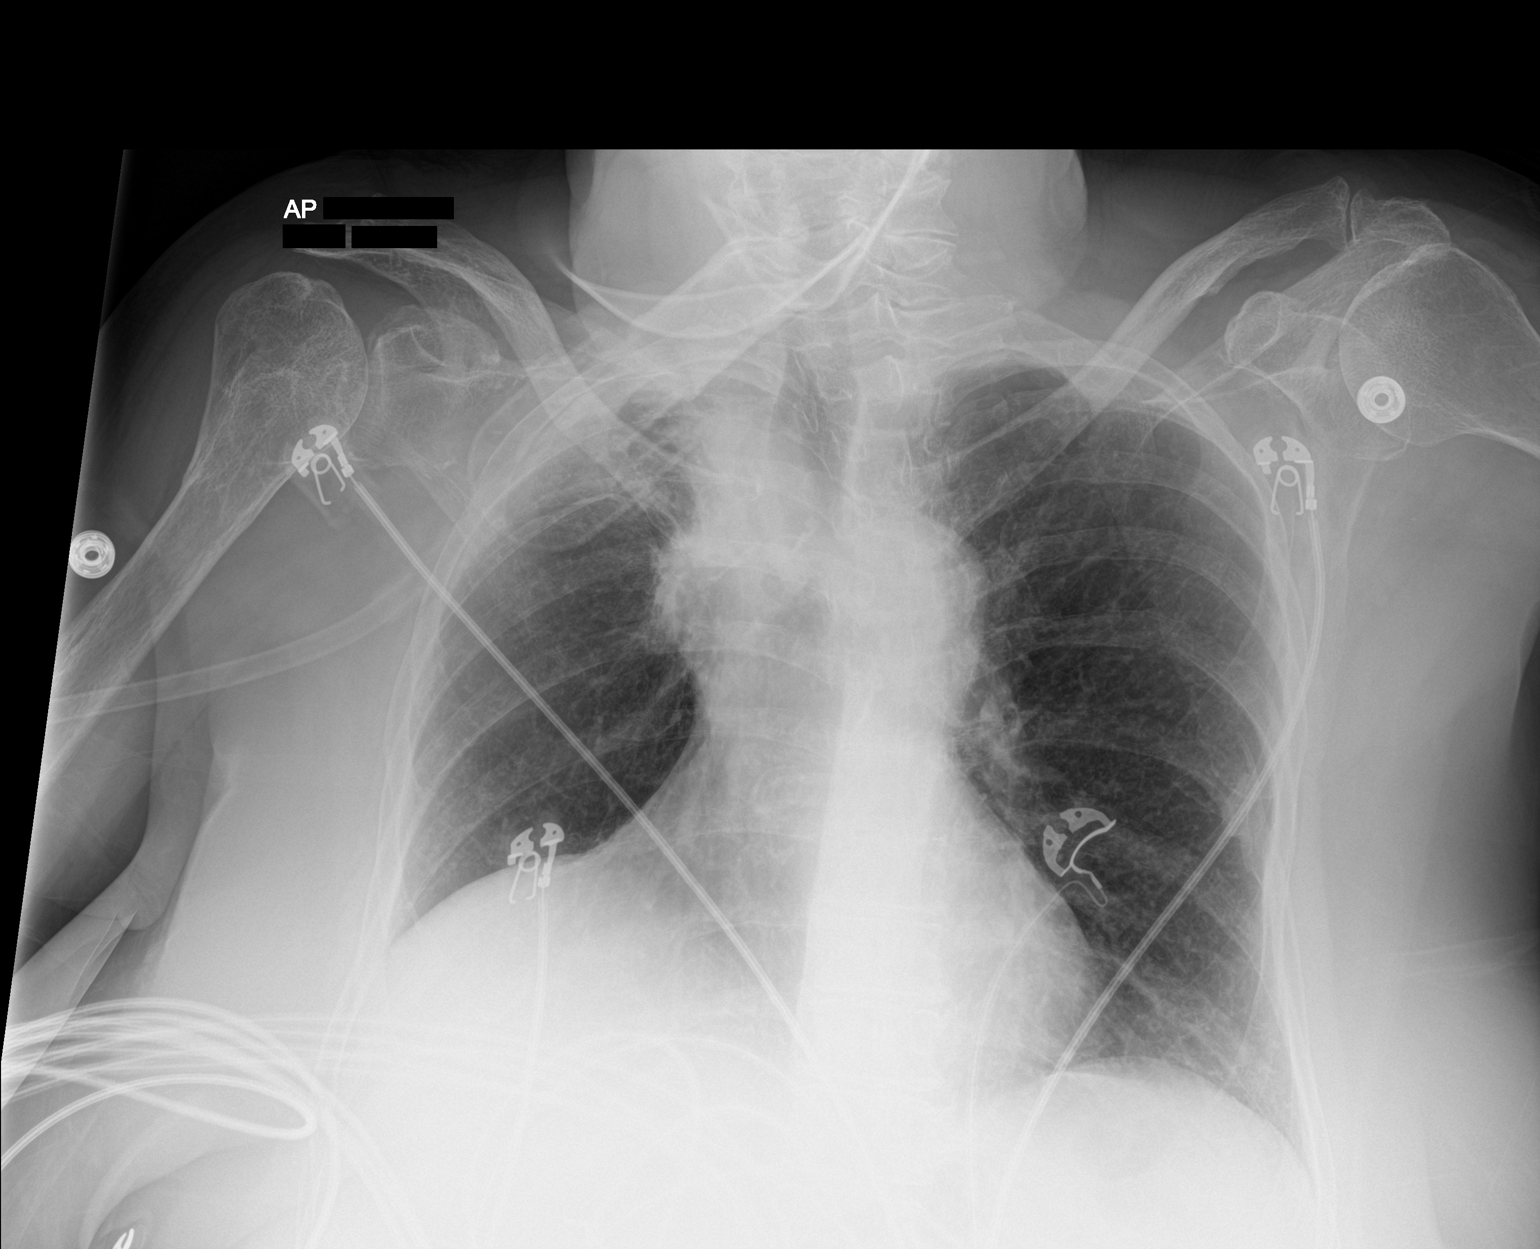

[1 of 1 positions shown; findings below may reference images not displayed]

FINDINGS: The cardiomediastinal silhouette is unchanged in contour.Irregular
RIGHT superior paramediastinal opacity with associated volume loss
and architectural distortion is similar in comparison to prior.
Irregular RIGHT apical opacities are similar in comparison to prior.
Unchanged elevation of the RIGHT hemidiaphragm. No pleural effusion.
No pneumothorax. No acute pleuroparenchymal abnormality. Visualized
abdomen is unremarkable. Remote LEFT-sided rib fracture.
IMPRESSION: No acute cardiopulmonary abnormality.

## 2023-01-10 ENCOUNTER — Ambulatory Visit (INDEPENDENT_AMBULATORY_CARE_PROVIDER_SITE_OTHER): Payer: Medicare Other | Admitting: Family Medicine

## 2023-01-10 VITALS — BP 97/64 | HR 102 | Temp 97.7°F | Ht 62.0 in | Wt 135.0 lb

## 2023-01-10 DIAGNOSIS — R112 Nausea with vomiting, unspecified: Secondary | ICD-10-CM | POA: Diagnosis not present

## 2023-01-10 LAB — COMPREHENSIVE METABOLIC PANEL
ALT: 12 U/L (ref 0–35)
AST: 15 U/L (ref 0–37)
Albumin: 3.7 g/dL (ref 3.5–5.2)
Alkaline Phosphatase: 72 U/L (ref 39–117)
BUN: 21 mg/dL (ref 6–23)
CO2: 25 mEq/L (ref 19–32)
Calcium: 9.2 mg/dL (ref 8.4–10.5)
Chloride: 99 mEq/L (ref 96–112)
Creatinine, Ser: 1.08 mg/dL (ref 0.40–1.20)
GFR: 50.59 mL/min — ABNORMAL LOW (ref >60.00)
Glucose, Bld: 269 mg/dL — ABNORMAL HIGH (ref 70–99)
Potassium: 4.1 mEq/L (ref 3.5–5.1)
Sodium: 135 mEq/L (ref 135–145)
Total Bilirubin: 0.4 mg/dL (ref 0.2–1.2)
Total Protein: 6.6 g/dL (ref 6.0–8.3)

## 2023-01-10 LAB — CBC WITH DIFFERENTIAL/PLATELET
Basophils Absolute: 0 10*3/uL (ref 0.0–0.1)
Basophils Relative: 0.4 % (ref 0.0–3.0)
Eosinophils Absolute: 0 10*3/uL (ref 0.0–0.7)
Eosinophils Relative: 0.4 % (ref 0.0–5.0)
HCT: 38.6 % (ref 36.0–46.0)
Hemoglobin: 12.4 g/dL (ref 12.0–15.0)
Lymphocytes Relative: 9.3 % — ABNORMAL LOW (ref 12.0–46.0)
Lymphs Abs: 0.9 10*3/uL (ref 0.7–4.0)
MCHC: 32.1 g/dL (ref 30.0–36.0)
MCV: 82.1 fl (ref 78.0–100.0)
Monocytes Absolute: 1 10*3/uL (ref 0.1–1.0)
Monocytes Relative: 10 % (ref 3.0–12.0)
Neutro Abs: 7.7 10*3/uL (ref 1.4–7.7)
Neutrophils Relative %: 79.9 % — ABNORMAL HIGH (ref 43.0–77.0)
Platelets: 270 10*3/uL (ref 150.0–400.0)
RBC: 4.7 Mil/uL (ref 3.87–5.11)
RDW: 17.5 % — ABNORMAL HIGH (ref 11.5–15.5)
WBC: 9.7 10*3/uL (ref 4.0–10.5)

## 2023-01-10 MED ORDER — PANTOPRAZOLE SODIUM 40 MG PO TBEC: 40.0000 mg | DELAYED_RELEASE_TABLET | Freq: Two times a day (BID) | ORAL | 1 refills | Status: DC

## 2023-01-10 NOTE — Patient Instructions (Signed)
Increase pantoprazole to 40 mg twice a day

## 2023-01-10 NOTE — Progress Notes (Signed)
OFFICE VISIT  01/10/2023  CC:  Chief Complaint  Patient presents with   Emesis    X 2 weeks; pt has been eating regular food, no specific  foods cause her to throw up. Pt has not vomited today but did yesterday. Everyone in the house had a stomach bug a few weeks ago, pt is still vomiting.     Patient is a 77 y.o. female who presents accompanied by her grandson for vomiting.  HPI: Patient has had 3 episodes of vomiting in the last couple weeks. No abdominal pain, no fever, no diarrhea.  She has a bowel movement every couple of days and says it is hard but this is not changed for her. On the days she has had a vomiting episode she apparently wakes up with generalized fatigue that is worse than normal, requires more assistance with self-care. No acute urinary complaints.  No chest pain.  No change in baseline dyspnea on exertion.  No diaphoresis. Her glucoses have not been significantly elevated and she has not had any hypoglycemia.  ROS as above, plus--> no fevers, no CP, + orthostatic dizziness, +chronic generalized weakness. no HAs, no rashes, no melena/hematochezia.  No polyuria or polydipsia.  No myalgias or arthralgias.  No focal weakness, paresthesias, or tremors.  No acute vision or hearing abnormalities.  No recent changes in lower legs. No palpitations.     Past Medical History:  Diagnosis Date   Acute renal failure (HCC)    Age-related nuclear cataract of both eyes 2016   +cortical age related cataracts OU    Anxiety and depression    Bilateral diabetic retinopathy ([place]) 2015   Dr. 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   Colonic diverticular abscess    COPD (chronic obstructive pulmonary disease) (HCC)    CVA (cerebral vascular accident) ([place]) 09/2016   MRI did show a left-sided small  ischemic stroke which was acute but likely incidental (MRI was done b/c pt had TIA sx's in hospital).  Neuro put her on plavix at that time.  Cardiology d/c'd her plavix 08/2017.   Diabetes mellitus with complication (HCC)    diab retinopathy OU (laser)   Diverticulitis of large intestine with perforation and abscess 09/11/2016   Finger fracture, right 04/2020   R 5th metacarpal fx at the base (Dr. Mardelle Matte)   GERD (gastroesophageal reflux disease)    protonix   History of concussion 08/25/2018   w/out loss of consciousness--mild increase in baseline memory impairment after.  CT head neg acute.   History of diverticulitis of colon    with abscess; required IR percutaneous drain placed 09/2016.   History of iron deficiency anemia    started iron 07/02/19.  EGD/colonoscopy 08/27/19 unrevealing. Capsule endoscopy considered but never done.   Hyperlipidemia    Hypertension    Left bundle branch block (LBBB) 12/16/2016   Lung cancer ([place])    non-small cell lung ca, stage III in 05/2011; systemic chemotherapy concurrent with radiation followed by prophylactic cranial irradiation and has been observation since July of 2010 with no evidence for disease recurrence-released from onc f/u 12/2014 (needs annual cxr by PCP).  CXR 08/2015 stable.  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 Earlie Server did bx and this was NEG for malignancy.   Mild cognitive impairment with memory loss  Likely from brain radiation therapy   Non-obstructive CAD (coronary artery disease)    a. Cath 2006 preserved LV fxn, scattered irregularities without critical stenosis; b. 2008 stress echo negative for ischemia, but with hypertensive response; c. 08/2016 Cath: D1 25%, otw nl.   Normocytic anemia 2018   Iron, vit B12, folate all normal 12/2016.   Open-angle glaucoma 2016   Dr. Shirley Muscat, (bilateral)---responding to topical therapy   Osteoarthritis of both hands    Osteoporosis 03/2016; 03/2018   03/2016 DEXA T-score -2.2.  03/2018 T-score L  femoral neck  -2.7--boniva started. DEXA 04/2021 T scor -2.6->boniva continued. Plan rpt DEXA 2 yrs.   Pneumonia    hospitalization 11/2016; hypoxemic resp failure--d/c'd home with home oxygen therapy   Pulmonary nodule, left 08/2021   LUL 11 mm ground glass, needs f/u CT 08/2022.   Seasonal allergic rhinitis    Subclinical hyperthyroidism 2018   Takotsubo cardiomyopathy    a. 08/2016 Echo: EF 30-35%, gr1DD, PASP 68mmHg;  b. 08/2016 Cath: nonobs Dzs;  c. 11/2016 Echo: EF 40-45%, no rwma, nl RV fxn, mild TR, PASP 13mmHg.  ECHO 02/2017 EF 50-55%, grd II DD---essentially resolved Takotsubo 02/2017.   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 08/2016 in setting of diverticulitis & pneumoperitoneum and Takotsubo CM -->prolonged QT, seen by EP-->avoid meds with potential for QT prolongation.    Past Surgical History:  Procedure Laterality Date   ABDOMINAL HYSTERECTOMY  1997   APPENDECTOMY     CARDIAC CATHETERIZATION N/A 09/14/2016   Procedure: Left Heart Cath and Coronary Angiography;  Surgeon: Troy Sine, MD;  Location: [place] [facility];  Service: Cardiovascular; Mild nonobstructive CAD with 85% smooth narrowing in the first diagonal branch of the LAD; 10% smooth narrowing of the ostial proximal left circumflex coronary artery; and a normal dominant RCA.   CARPAL TUNNEL RELEASE     CATARACT EXTRACTION Bilateral    CHOLECYSTECTOMY  2002   COLONOSCOPY  10/24/00; 08/27/19   2002 normal.  [employer] hemoccult testing via [facility] 06/18/15 was NEG.  2020->diverticulosis o/w nl.   dexa  03/2016; 03/2018   03/2018 T-score L femoral neck  -2.7 (worsened compared to osteopenic range 2017.) DEXA 05/03/21 stable T score -2.6 on boniva x 2 yrs->plan rpt DEXA 2 yrs.   ESOPHAGOGASTRODUODENOSCOPY  08/27/2019   Normal.   IR GENERIC HISTORICAL  09/19/2016   IR SINUS/FIST TUBE CHK-NON GI 09/19/2016 Corrie Mckusick, DO [facility]   IR GENERIC HISTORICAL  10/06/2016   IR RADIOLOGIST EVAL & MGMT 10/06/2016 [facility]    IR GENERIC HISTORICAL  10/26/2016   IR RADIOLOGIST EVAL & MGMT 10/26/2016 [facility]   IR GENERIC HISTORICAL  11/03/2016   IR RADIOLOGIST EVAL & MGMT 11/03/2016 Ardis Rowan, PA-C [facility]   IR GENERIC HISTORICAL  11/24/2016   IR RADIOLOGIST EVAL & MGMT 11/24/2016 Ardis Rowan, PA-C [facility]   IR GENERIC HISTORICAL  12/05/2016   Fistula smaller/improving.  IR SINUS/FIST TUBE CHK-NON GI 12/05/2016 Sandi Mariscal, MD [facility]   IR GENERIC HISTORICAL  12/22/2016   IR RADIOLOGIST EVAL & MGMT 12/22/2016 Greggory Keen, MD [facility]   IR RADIOLOGIST EVAL & MGMT  01/10/2017   IR RADIOLOGIST EVAL & MGMT  02/09/2017   IR SINUS/FIST TUBE CHK-NON GI  03/28/2017   LEFT HEART CATHETERIZATION WITH CORONARY ANGIOGRAM N/A 05/30/2012   Procedure: LEFT HEART CATHETERIZATION WITH CORONARY ANGIOGRAM;  Surgeon: Burnell Blanks, MD;  Location: [facility]  CATH LAB;  Service: Cardiovascular: Mild, non-obstructive CAD   TRANSTHORACIC ECHOCARDIOGRAM  09/14/2016; 11/2016   08/2016: EF 30-35 %, Akinesis of the mid-apicalanteroseptal myocardium.  Grade I DD.  Mild pulm HTN.  Repeat echo 11/2016, EF 40-45%, no rwma, nl RV fxn, mild TR, PASP 105mmHg.     TRANSTHORACIC ECHOCARDIOGRAM  03/07/2017   EF 50-55%. Hypokinesis of the distal septum with overall low normal LV,  systolic function; mild diastolic dysfunction with elevated LV  filling pressure; mildly calcified aortic valve with mild AI; small pericardial effusion   TRANSTHORACIC ECHOCARDIOGRAM     04/2022 EF 60-65%, no WMA, no valve probs   Zio patch  11/2022   3 day:  primarily sinus, avg HR 97, brief run SVT, rare PVC    Outpatient Medications Prior to Visit  Medication Sig Dispense Refill   albuterol (PROVENTIL) (2.5 MG/3ML) 0.083% nebulizer solution USE 1 VIAL IN NEBULIZER EVERY 4 HOURS AS NEEDED FOR WHEEZING AND FOR SHORTNESS OF BREATH 90 mL 1   albuterol (VENTOLIN HFA) 108 (90 Base) MCG/ACT inhaler INHALE 2 PUFFS BY MOUTH  EVERY 6 HOURS AS NEEDED FOR WHEEZING OR SHORTNESS OF BREATH 18 g 1   aspirin 81 MG EC tablet Take 81 mg by mouth daily.      atorvastatin (LIPITOR) 40 MG tablet Take 1 tablet (40 mg total) by mouth daily. 90 tablet 1   budesonide-formoterol (SYMBICORT) 160-4.5 MCG/ACT inhaler Inhale 2 puffs into the lungs 2 (two) times daily. 1 each 6   buPROPion (WELLBUTRIN XL) 300 MG 24 hr tablet Take 1 tablet (300 mg total) by mouth daily. 90 tablet 1   Calcium Carb-Cholecalciferol (CALCIUM + VITAMIN D3 PO) Take 1-2 tablets by mouth See admin instructions. Take 1 tablet by mouth in the morning and 2 tablets by mouth at bedtime     cetirizine (ZYRTEC) 10 MG tablet Take 10 mg by mouth every evening.     HUMULIN 70/30 KWIKPEN (70-30) 100 UNIT/ML KwikPen INJECT 4 UNITS SUBCUTANEOUSLY WITH BREAKFAST AND 2 UNITS WITH SUPPER 15 mL 0   ibandronate (BONIVA) 150 MG tablet Take 1 tablet (150 mg total) by mouth every 30 (thirty) days. Take in the morning with a full glass of water, on an empty stomach, and do not take anything else by mouth or lie down for the next 30 min. 3 tablet 3   ketorolac (ACULAR) 0.5 % ophthalmic solution Place 1 drop into the left eye 4 (four) times daily.     Multiple Vitamins-Minerals (MULTIVITAMIN WOMEN PO) Take 1 tablet by mouth daily.     ONETOUCH ULTRA test strip See admin instructions.     prednisoLONE acetate (PRED FORTE) 1 % ophthalmic suspension Place 1 drop into the left eye.     Spacer/Aero-Holding Chambers (AEROCHAMBER MV) inhaler Use as instructed 1 each 2   pantoprazole (PROTONIX) 40 MG tablet Take 1 tablet (40 mg total) by mouth daily. 90 tablet 1   acetaminophen (TYLENOL) 500 MG tablet Take 1,000 mg by mouth daily as needed for headache. (Patient not taking: Reported on 01/10/2023)     guaiFENesin-dextromethorphan (ROBITUSSIN DM) 100-10 MG/5ML syrup Take 5 mLs by mouth every 4 (four) hours as needed for cough (chest congestion). (Patient not taking: Reported on 01/10/2023) 118 mL 0    No facility-administered medications prior to visit.    Allergies  Allergen Reactions   Lactose Intolerance (Gi) Diarrhea   Motrin [Ibuprofen] Itching, Anxiety and Other (See Comments)    Anxiousness Hyperventilates  Review of Systems  As per HPI  PE:    01/10/2023   11:00 AM 12/28/2022    3:23 PM 11/18/2022   10:07 AM  Vitals with BMI  Height 5\' 2"   5\' 2"   Weight 135 lbs 142 lbs 142 lbs 6 oz  BMI [id number]  [id number]  Systolic 97  99  Diastolic 64  65  Pulse [id number]  105    Physical Exam  Gen: Alert, tired appearing but in no distress.  Does not appear acutely ill.   Patient is oriented to person, place, time, and situation. VH:[id number]: no injection, icteris, swelling, or exudate.  EOMI, PERRLA. Mouth: lips without lesion/swelling.  Oral mucosa pink and moist. Oropharynx without erythema, exudate, or swelling.  CV: Regular, rate 100, question soft systolic murmur, no diastolic murmur LUNGS: diffuse diminished aeration, mild diffuse exp wheeze.  Nonlabored at rest--->baseline lung exam. EXT: no clubbing or cyanosis.  no edema.    LABS:  Last CBC Lab Results  Component Value Date   WBC 9.1 11/03/2022   HGB 12.6 11/03/2022   HCT 38.2 11/03/2022   MCV 86.7 11/03/2022   MCH 28.6 10/18/2022   RDW 17.0 (H) 11/03/2022   PLT 320.0 99991111   Last metabolic panel Lab Results  Component Value Date   GLUCOSE 268 (H) 11/03/2022   NA 138 11/03/2022   K 4.6 11/03/2022   CL 103 11/03/2022   CO2 27 11/03/2022   BUN 22 11/03/2022   CREATININE 1.19 11/03/2022   GFRNONAA 56 (L) 10/18/2022   CALCIUM 9.0 11/03/2022   PHOS 2.9 05/22/2022   PROT 6.1 (L) 10/18/2022   ALBUMIN 3.2 (L) 10/18/2022   LABGLOB 2.7 09/19/2018   AGRATIO 1.6 09/19/2018   BILITOT 0.6 10/18/2022   ALKPHOS 59 10/18/2022   AST 17 10/18/2022   ALT 12 10/18/2022   ANIONGAP 6 10/18/2022   Last hemoglobin A1c Lab Results  Component Value Date   HGBA1C 7.4 (A) 11/18/2022   HGBA1C 7.4 11/18/2022   HGBA1C  7.4 (A) 11/18/2022   HGBA1C 7.4 (A) 11/18/2022   IMPRESSION AND PLAN:  Vomiting, question whether or not any preceding nausea. Infrequent.  I do not think she is dehydrated. She is possibly having significant periods of breakthrough GERD. Will increase pantoprazole to 40 mg twice daily. Will check CBC and c-Met today.  An After Visit Summary was printed and given to the patient.  FOLLOW UP: Return in about 2 weeks (around 01/24/2023) for f/u vomiting.  Signed:  Crissie Sickles, MD           01/10/2023

## 2023-01-23 ENCOUNTER — Telehealth: Payer: Self-pay | Admitting: *Deleted

## 2023-01-23 NOTE — Telephone Encounter (Signed)
-----   Message from Leonie Man, MD sent at 01/21/2023  2:33 PM EDT -----  3-day Zio patch monitor-January 2024   Predominant Rhythm Is Sinus Rhythm with Underlying LBBB: Heart rate range 78-133 bpm, Avg 94 bpm   1 Atrial Run of 10 beats-average heart rate 136 bpm.  (HR range 124 from 143 bpm , 4.4 seconds)   Isolated PACs and PVCs.  No couplets or triplets noted.  Ventricular trigeminy noted.   Only 1 diary entry/trigger noted with sinus tachycardia and PVCs.     Overall pretty benign study.  No gross abnormalities noted.       Glenetta Hew, MD

## 2023-01-23 NOTE — Telephone Encounter (Signed)
Per DPR, left detail message of results on patient's voicemail . Any question may call back . Keep appointment for 01/30/23

## 2023-01-24 ENCOUNTER — Encounter: Payer: Self-pay | Admitting: Family Medicine

## 2023-01-24 ENCOUNTER — Ambulatory Visit (INDEPENDENT_AMBULATORY_CARE_PROVIDER_SITE_OTHER): Payer: Medicare Other | Admitting: Family Medicine

## 2023-01-24 VITALS — BP 99/63 | HR 110 | Wt 134.4 lb

## 2023-01-24 DIAGNOSIS — K219 Gastro-esophageal reflux disease without esophagitis: Secondary | ICD-10-CM

## 2023-01-24 DIAGNOSIS — R112 Nausea with vomiting, unspecified: Secondary | ICD-10-CM | POA: Diagnosis not present

## 2023-01-24 NOTE — Progress Notes (Signed)
OFFICE VISIT  01/24/2023  CC:  Chief Complaint  Patient presents with   Follow-up    2 week follow up on vomiting. She states she has been feeling better. No other questions or concerns.    Patient is a 77 y.o. female who presents for 2-week follow-up nausea and vomiting. A/P as of last visit: "Vomiting, question whether or not any preceding nausea. Infrequent.  I do not think she is dehydrated. She is possibly having significant periods of breakthrough GERD. Will increase pantoprazole to 40 mg twice daily. Will check CBC and c-Met today."  INTERIM HX: Labs last visit all normal except glucose level 269 and GFR 51, which is actually a little bit above her baseline.  She has not had any nausea or vomiting episodes. No new concerns.  Past Medical History:  Diagnosis Date   Acute renal failure    Age-related nuclear cataract of both eyes 2016   +cortical age related cataracts OU    Anxiety and depression    Bilateral diabetic retinopathy 2015   Dr. 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   Colonic diverticular abscess    COPD (chronic obstructive pulmonary disease)    CVA (cerebral vascular accident) 09/2016   MRI did show a left-sided small ischemic stroke which was acute but likely incidental (MRI was done b/c pt had TIA sx's in hospital).  Neuro put her on plavix at that time.  Cardiology d/c'd her plavix 08/2017.   Diabetes mellitus with complication    diab retinopathy OU (laser)   Diverticulitis of large intestine with perforation and abscess 09/11/2016   Finger fracture, right 04/2020   R 5th metacarpal fx at the base (Dr. Mardelle Matte)   GERD (gastroesophageal reflux disease)    protonix   History of concussion 08/25/2018   w/out loss of consciousness--mild increase in baseline memory  impairment after.  CT head neg acute.   History of diverticulitis of colon    with abscess; required IR percutaneous drain placed 09/2016.   History of iron deficiency anemia    started iron 07/02/19.  EGD/colonoscopy 08/27/19 unrevealing. Capsule endoscopy considered but never done.   Hyperlipidemia    Hypertension    Left bundle branch block (LBBB) 12/16/2016   Lung cancer    non-small cell lung ca, stage III in 05/2011; systemic chemotherapy concurrent with radiation followed by prophylactic cranial irradiation and has been observation since July of 2010 with no evidence for disease recurrence-released from onc f/u 12/2014 (needs annual cxr by PCP).  CXR 08/2015 stable.  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 Earlie Server did bx and this was NEG for malignancy.   Mild cognitive impairment with memory loss    Likely from brain radiation therapy   Non-obstructive CAD (coronary artery disease)    a. Cath 2006 preserved LV fxn, scattered irregularities without critical stenosis; b. 2008 stress echo negative for ischemia, but with hypertensive response; c. 08/2016 Cath: D1 25%, otw nl.   Normocytic anemia 2018   Iron, vit B12, folate all normal 12/2016.   Open-angle glaucoma 2016   Dr. Shirley Muscat, (bilateral)---responding to topical therapy   Osteoarthritis of both hands    Osteoporosis 03/2016; 03/2018   03/2016 DEXA T-score -2.2.  03/2018 T-score L femoral neck  -2.7--boniva started. DEXA 04/2021 T scor -2.6->boniva continued. Plan rpt  DEXA 2 yrs.   Pneumonia    hospitalization 11/2016; hypoxemic resp failure--d/c'd home with home oxygen therapy   Pulmonary nodule, left 08/2021   LUL 11 mm ground glass, needs f/u CT 08/2022.   Seasonal allergic rhinitis    Subclinical hyperthyroidism 2018   Takotsubo cardiomyopathy    a. 08/2016 Echo: EF 30-35%, gr1DD, PASP 34mmHg;  b. 08/2016 Cath: nonobs Dzs;  c. 11/2016 Echo: EF 40-45%, no rwma, nl RV fxn, mild TR, PASP 38mmHg.  ECHO 02/2017 EF 50-55%, grd II  DD---essentially resolved Takotsubo 02/2017.   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 08/2016 in setting of diverticulitis & pneumoperitoneum and Takotsubo CM -->prolonged QT, seen by EP-->avoid meds with potential for QT prolongation.    Past Surgical History:  Procedure Laterality Date   ABDOMINAL HYSTERECTOMY  1997   APPENDECTOMY     CARDIAC CATHETERIZATION N/A 09/14/2016   Procedure: Left Heart Cath and Coronary Angiography;  Surgeon: Troy Sine, MD;  Location: [facility];  Service: Cardiovascular; Mild nonobstructive CAD with 85% smooth narrowing in the first diagonal branch of the LAD; 10% smooth narrowing of the ostial proximal left circumflex coronary artery; and a normal dominant RCA.   CARPAL TUNNEL RELEASE     CATARACT EXTRACTION Bilateral    CHOLECYSTECTOMY  2002   COLONOSCOPY  10/24/00; 08/27/19   2002 normal.  [employer] hemoccult testing via [facility] 06/18/15 was NEG.  2020->diverticulosis o/w nl.   dexa  03/2016; 03/2018   03/2018 T-score L femoral neck  -2.7 (worsened compared to osteopenic range 2017.) DEXA 05/03/21 stable T score -2.6 on boniva x 2 yrs->plan rpt DEXA 2 yrs.   ESOPHAGOGASTRODUODENOSCOPY  08/27/2019   Normal.   IR GENERIC HISTORICAL  09/19/2016   IR SINUS/FIST TUBE CHK-NON GI 09/19/2016 Corrie Mckusick, DO [facility]   IR GENERIC HISTORICAL  10/06/2016   IR RADIOLOGIST EVAL & MGMT 10/06/2016 [facility]   IR GENERIC HISTORICAL  10/26/2016   IR RADIOLOGIST EVAL & MGMT 10/26/2016 [facility]   IR GENERIC HISTORICAL  11/03/2016   IR RADIOLOGIST EVAL & MGMT 11/03/2016 Ardis Rowan, PA-C [facility]   IR GENERIC HISTORICAL  11/24/2016   IR RADIOLOGIST EVAL & MGMT 11/24/2016 Ardis Rowan, PA-C [facility]   IR GENERIC HISTORICAL  12/05/2016   Fistula smaller/improving.  IR SINUS/FIST TUBE CHK-NON GI 12/05/2016 Sandi Mariscal, MD [facility]   IR GENERIC HISTORICAL  12/22/2016   IR RADIOLOGIST EVAL & MGMT 12/22/2016 Greggory Keen, MD  [facility]   IR RADIOLOGIST EVAL & MGMT  01/10/2017   IR RADIOLOGIST EVAL & MGMT  02/09/2017   IR SINUS/FIST TUBE CHK-NON GI  03/28/2017   LEFT HEART CATHETERIZATION WITH CORONARY ANGIOGRAM N/A 05/30/2012   Procedure: LEFT HEART CATHETERIZATION WITH CORONARY ANGIOGRAM;  Surgeon: Burnell Blanks, MD;  Location: [facility] C[facility];  Service: Cardiovascular: Mild, non-obstructive CAD   TRANSTHORACIC ECHOCARDIOGRAM  09/14/2016; 11/2016   08/2016: EF 30-35 %, Akinesis of the mid-apicalanteroseptal myocardium.  Grade I DD.  Mild pulm HTN.  Repeat echo 11/2016, EF 40-45%, no rwma, nl RV fxn, mild TR, PASP 45mmHg.     TRANSTHORACIC ECHOCARDIOGRAM  03/07/2017   EF 50-55%. Hypokinesis of the distal septum with overall low normal LV,  systolic function; mild diastolic dysfunction with elevated LV  filling pressure; mildly calcified aortic valve with mild AI; small pericardial effusion   TRANSTHORACIC ECHOCARDIOGRAM     04/2022 EF 60-65%, no WMA, no  valve probs   Zio patch  11/2022   3 day:  primarily sinus, avg HR 97, brief run SVT, rare PVC    Outpatient Medications Prior to Visit  Medication Sig Dispense Refill   albuterol (PROVENTIL) (2.5 MG/3ML) 0.083% nebulizer solution USE 1 VIAL IN NEBULIZER EVERY 4 HOURS AS NEEDED FOR WHEEZING AND FOR SHORTNESS OF BREATH 90 mL 1   albuterol (VENTOLIN HFA) 108 (90 Base) MCG/ACT inhaler INHALE 2 PUFFS BY MOUTH EVERY 6 HOURS AS NEEDED FOR WHEEZING OR SHORTNESS OF BREATH 18 g 1   aspirin 81 MG EC tablet Take 81 mg by mouth daily.      atorvastatin (LIPITOR) 40 MG tablet Take 1 tablet (40 mg total) by mouth daily. 90 tablet 1   budesonide-formoterol (SYMBICORT) 160-4.5 MCG/ACT inhaler Inhale 2 puffs into the lungs 2 (two) times daily. 1 each 6   buPROPion (WELLBUTRIN XL) 300 MG 24 hr tablet Take 1 tablet (300 mg total) by mouth daily. 90 tablet 1   Calcium Carb-Cholecalciferol (CALCIUM + VITAMIN D3 PO) Take 1-2 tablets by mouth See admin instructions. Take 1  tablet by mouth in the morning and 2 tablets by mouth at bedtime     cetirizine (ZYRTEC) 10 MG tablet Take 10 mg by mouth every evening.     HUMULIN 70/30 KWIKPEN (70-30) 100 UNIT/ML KwikPen INJECT 4 UNITS SUBCUTANEOUSLY WITH BREAKFAST AND 2 UNITS WITH SUPPER 15 mL 0   ibandronate (BONIVA) 150 MG tablet Take 1 tablet (150 mg total) by mouth every 30 (thirty) days. Take in the morning with a full glass of water, on an empty stomach, and do not take anything else by mouth or lie down for the next 30 min. 3 tablet 3   ketorolac (ACULAR) 0.5 % ophthalmic solution Place 1 drop into the left eye 4 (four) times daily.     Multiple Vitamins-Minerals (MULTIVITAMIN WOMEN PO) Take 1 tablet by mouth daily.     ONETOUCH ULTRA test strip See admin instructions.     pantoprazole (PROTONIX) 40 MG tablet Take 1 tablet (40 mg total) by mouth 2 (two) times daily. 180 tablet 1   prednisoLONE acetate (PRED FORTE) 1 % ophthalmic suspension Place 1 drop into the left eye.     Spacer/Aero-Holding Chambers (AEROCHAMBER MV) inhaler Use as instructed 1 each 2   acetaminophen (TYLENOL) 500 MG tablet Take 1,000 mg by mouth daily as needed for headache. (Patient not taking: Reported on 01/10/2023)     No facility-administered medications prior to visit.    Allergies  Allergen Reactions   Lactose Intolerance (Gi) Diarrhea   Motrin [Ibuprofen] Itching, Anxiety and Other (See Comments)    Anxiousness Hyperventilates     Review of Systems As per HPI  PE:    01/24/2023   10:39 AM 01/10/2023   11:00 AM 12/28/2022    3:23 PM  Vitals with BMI  Height  5\' 2"    Weight 134 lbs 6 oz 135 lbs 142 lbs  BMI 0000000 123456   Systolic 99 97   Diastolic 63 64   Pulse [id number] 102      Physical Exam  Gen: Alert, well appearing.  Patient is oriented to person, place, time, and situation. AFFECT: pleasant, lucid thought and speech. CV: Regular, tachy to 110, soft syst murmur Chest is clear, no wheezing or rales. Normal symmetric air  entry throughout both lung fields. No chest wall deformities or tenderness.  LABS:  Last CBC Lab Results  Component Value Date  WBC 9.7 01/10/2023   HGB 12.4 01/10/2023   HCT 38.6 01/10/2023   MCV 82.1 01/10/2023   MCH 28.6 10/18/2022   RDW 17.5 (H) 01/10/2023   PLT 270.0 99991111   Last metabolic panel Lab Results  Component Value Date   GLUCOSE 269 (H) 01/10/2023   NA 135 01/10/2023   K 4.1 01/10/2023   CL 99 01/10/2023   CO2 25 01/10/2023   BUN 21 01/10/2023   CREATININE 1.08 01/10/2023   GFRNONAA 56 (L) 10/18/2022   CALCIUM 9.2 01/10/2023   PHOS 2.9 05/22/2022   PROT 6.6 01/10/2023   ALBUMIN 3.7 01/10/2023   LABGLOB 2.7 09/19/2018   AGRATIO 1.6 09/19/2018   BILITOT 0.4 01/10/2023   ALKPHOS 72 01/10/2023   AST 15 01/10/2023   ALT 12 01/10/2023   ANIONGAP 6 10/18/2022   Lab Results  Component Value Date   HGBA1C 7.4 (A) 11/18/2022   HGBA1C 7.4 11/18/2022   HGBA1C 7.4 (A) 11/18/2022   HGBA1C 7.4 (A) 11/18/2022   IMPRESSION AND PLAN:  Vomiting, suspect she was having breakthrough reflux. This is resolved since being on pantoprazole 40 mg twice a day.  Continue this regimen.  An After Visit Summary was printed and given to the patient.  FOLLOW UP: Return for keep appt set for 02/17/23.  Signed:  Crissie Sickles, MD           01/24/2023

## 2023-01-24 NOTE — Patient Instructions (Signed)
Continue taking pantoprazole twice a day

## 2023-01-30 ENCOUNTER — Encounter: Payer: Self-pay | Admitting: Cardiology

## 2023-01-30 ENCOUNTER — Telehealth: Payer: Self-pay

## 2023-01-30 ENCOUNTER — Ambulatory Visit: Payer: Medicare Other | Attending: Cardiology | Admitting: Cardiology

## 2023-01-30 VITALS — BP 97/57 | HR 95 | Ht 60.0 in | Wt 136.0 lb

## 2023-01-30 DIAGNOSIS — I5032 Chronic diastolic (congestive) heart failure: Secondary | ICD-10-CM

## 2023-01-30 DIAGNOSIS — I5181 Takotsubo syndrome: Secondary | ICD-10-CM | POA: Diagnosis not present

## 2023-01-30 DIAGNOSIS — I951 Orthostatic hypotension: Secondary | ICD-10-CM | POA: Diagnosis not present

## 2023-01-30 DIAGNOSIS — I10 Essential (primary) hypertension: Secondary | ICD-10-CM

## 2023-01-30 DIAGNOSIS — R000 Tachycardia, unspecified: Secondary | ICD-10-CM

## 2023-01-30 DIAGNOSIS — I2510 Atherosclerotic heart disease of native coronary artery without angina pectoris: Secondary | ICD-10-CM

## 2023-01-30 NOTE — Telephone Encounter (Signed)
I called the patient to get her ready for her virtual visit, LVM for her to return my call

## 2023-01-30 NOTE — Progress Notes (Signed)
Virtual Visit via Video Note   Because of [name]'s co-morbid illnesses, she is at least at moderate risk for complications without adequate follow up.  This format is felt to be most appropriate for this patient at this time.  All issues noted in this document were discussed and addressed.  A limited physical exam was performed with this format.  Please refer to the patient's chart for her consent to telehealth for [facility].     Patient has given verbal permission to conduct this visit via virtual appointment and to bill insurance 02/04/2023 5:05 PM     Evaluation Performed:  Follow-up visit  Date:  02/04/2023   ID:  [name], [name] 03/28/1946, MRN [id number]  Patient Location: Home Provider Location: Office/Clinic  PCP:  Rachael Massed, MD  Cardiologist:  Rachael Lemma, MD  Electrophysiologist:  None   Chief Complaint:   Chief Complaint  Patient presents with   Follow-up    Discussed results of monitor.  Doing well.  No signs or symptoms of tachycardia   ====================================  ASSESSMENT & PLAN:    Problem List Items Addressed This Visit       Cardiology Problems   Takotsubo cardiomyopathy (Chronic)    Essentially resolved.  She had been on low-dose beta-blocker, but no longer.  BP is not able to tolerate either ACE inhibitor or ARB.,      Orthostatic hypotension (Chronic)    Pretty significant history of orthostatic hypotension, tends to maybe be a little volume down.  Not hydrating enough..  Stressed importance of adequate hydration.  It seems that her blood pressures now are lower and therefore does not meet criteria for hypertensive anymore.  No room for beta-blockade or rectal reduction.      Non-occlusive coronary artery disease (Chronic)    Trivial nonocclusive disease noted by cardiac catheterization in 2017.  Lipids being monitored by PCP.  Have not seen labs since April of last year, should be due this month.   Last year LDL was 55 on October statin 40 mg daily.  Tolerating well. She is on aspirin for prevention based on coronary calcification.  Not unreasonable, but should have no problem holding If it is bleeding or concerns preoperatively. With no active anginal symptoms, she is not on an anticoagulant medications.  Borderline blood pressures limit the ability of use of any beta-blocker or ARB.       Chronic diastolic (congestive) heart failure (Chronic)    No reactive CHF symptoms I think her dyspnea is probably more related to underlying lung disease and currently her allergies are bothering her. With her borderline hypotension at this point in time, I would not put her on any afterload reduction or beta-blocker.        Other   Sinus tachycardia by electrocardiogram - Primary    Monitor did not show any significant tachycardic spells.  She does have a resting heart rate of 94 bpm which needs to be compensatory with her low BP.  She had 1 short atrial run.  Relatively benign monitor results.  I have surprised if she has an elevated resting heart rate in the 90s with low blood pressures in the 90s.  Continue to monitor, but has not at present would not be concerned.      ====================================  History of Present Illness:    [name] is a 77 y.o. 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 Milinda Cave.[name] was last seen November 15, 2022 for new patient visit to evaluate sinus tachycardia. => Back in January 2024 had a CT scan to evaluate for PE but showing emphysema and aortic calcification.  No PE.  She was noted to be  tachycardic with heart rates in the low 100s.  She was feeling tired and fatigued.  Not very active.  Family does not let her do much.  No chest pain or pressure just fatigue.  Baseline dyspnea.  No PND, orthopnea or edema. => Need to clarify if she is taking a statin or not. Zio patch monitor ordered  Hospitalizations:  N/a  Recent - Interim CV studies:   The following studies were reviewed today: 3-day Zio patch January 2024 showed:   Predominant Rhythm Is Sinus Rhythm with Underlying LBBB: Heart rate range 78-133 bpm, Avg 94 bpm   1 Atrial Run of 10 beats-average heart rate 136 bpm.  (HR range 124 from 143 bpm , 4.4 seconds)   Isolated PACs and PVCs.  No couplets or triplets noted.  Ventricular trigeminy noted.   Only 1 diary entry/trigger noted with sinus tachycardia and PVCs.  Inerval History   [name] is being seen today via video conferencing to discuss results of her recent event monitor.  She is accompanied by I think her grandson who helps with some of the answers.  She seems to be doing quite well.  She is dealing with some type of upper respiratory tract infection or allergy/cold symptoms-more than her usual amount of coughing, and dyspnea.  She is coughing quite a bit during our initial part of the conversation but once this cleared up, she seem to be doing quite well.  Denying any PND, orthopnea or edema.  Denying any significant symptoms of rapid irregular heartbeats or palpitations.  No syncope or near syncope.  No chest pain or pressure with rest or exertion.  Pretty sedentary.  Cardiovascular ROS: positive for - dyspnea on exertion and this is no change frombaseline.  Currently dealing with allergies/cold Sx-- cough- so a bit SOB negative for - chest pain, edema, irregular heartbeat, loss of consciousness, orthopnea, palpitations, paroxysmal nocturnal dyspnea, rapid heart rate, shortness of breath, or near syncope/TIA/amaurosis fugax   ROS:  Please see the  history of present illness.     Review of Systems  Constitutional:  Positive for malaise/fatigue (Baseline). Negative for weight loss.  HENT:  Positive for congestion, hearing loss, sinus pain and sore throat.   Respiratory:  Positive for cough, shortness of breath and wheezing. Negative for sputum production.        A little more than baseline-related to URI/allergies  Gastrointestinal:  Negative for blood in stool and melena.  Genitourinary:  Negative for hematuria.  Musculoskeletal:  Positive for joint pain.  Neurological:  Positive for dizziness (With coughing spells).    Past Medical History:  Diagnosis Date   Acute renal failure    Age-related nuclear cataract of both eyes 2016   +cortical age related cataracts OU    Anxiety and depression    Bilateral diabetic retinopathy 2015   Dr. 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   Colonic diverticular abscess    COPD (chronic obstructive pulmonary disease)    CVA (cerebral vascular accident) 09/2016   MRI did show a left-sided small ischemic stroke which was acute but likely incidental (MRI was done b/c pt had TIA sx's in hospital).  Neuro put her on plavix at that time.  Cardiology d/c'd her plavix 08/2017.   Diabetes mellitus with complication    diab retinopathy OU (laser)   Diverticulitis of large intestine with perforation and abscess 09/11/2016   Finger fracture, right 04/2020   R 5th metacarpal fx at the base (Dr. Dion Saucier)   GERD (gastroesophageal reflux disease)    protonix   History of concussion 08/25/2018   w/out loss of consciousness--mild increase in baseline memory impairment after.  CT head neg acute.   History of diverticulitis of colon    with abscess; required IR percutaneous drain placed 09/2016.   History of iron deficiency anemia     started iron 07/02/19.  EGD/colonoscopy 08/27/19 unrevealing. Capsule endoscopy considered but never done.   Hyperlipidemia    Hypertension    Left bundle branch block (LBBB) 12/16/2016   Lung cancer    non-small cell lung ca, stage III in 05/2011; systemic chemotherapy concurrent with radiation followed by prophylactic cranial irradiation and has been observation since July of 2010 with no evidence for disease recurrence-released from onc f/u 12/2014 (needs annual cxr by PCP).  CXR 08/2015 stable.  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 Shirline Frees did bx and this was NEG for malignancy.   Mild cognitive impairment with memory loss    Likely from brain radiation therapy   Non-obstructive CAD (coronary artery disease)    a. Cath 2006 preserved LV fxn, scattered irregularities without critical stenosis; b. 2008 stress echo negative for ischemia, but with hypertensive response; c. 08/2016 Cath: D1 25%, otw nl.   Normocytic anemia 2018   Iron, vit B12, folate all normal 12/2016.   Open-angle glaucoma 2016   Dr. Hanley Seamen, (bilateral)---responding to topical therapy   Osteoarthritis of both hands    Osteoporosis 03/2016; 03/2018   03/2016 DEXA T-score -2.2.  03/2018 T-score L femoral neck  -2.7--boniva started. DEXA 04/2021 T scor -2.6->boniva continued. Plan rpt DEXA 2 yrs.   Pneumonia    hospitalization 11/2016; hypoxemic resp failure--d/c'd home with home oxygen therapy   Pulmonary nodule, left 08/2021   LUL 11 mm ground glass, needs f/u CT 08/2022.   Seasonal allergic rhinitis    Subclinical hyperthyroidism 2018   Takotsubo cardiomyopathy    a. 08/2016 Echo: EF 30-35%, gr1DD, PASP ;  b. 08/2016 Cath: nonobs Dzs;  c. 11/2016 Echo: EF 40-45%, no rwma, nl RV fxn, mild TR, PASP .  ECHO 02/2017 EF 50-55%, grd II DD---essentially resolved Takotsubo 02/2017.   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 08/2016 in setting of diverticulitis & pneumoperitoneum and Takotsubo CM -->prolonged QT, seen by EP-->avoid  meds with potential for QT prolongation.   Past Surgical History:  Procedure Laterality Date   ABDOMINAL HYSTERECTOMY  1997   APPENDECTOMY     CARDIAC CATHETERIZATION N/A 09/14/2016   Procedure: Left Heart Cath and Coronary Angiography;  Surgeon: Lennette Bihari, MD;  Location: [facility] INVASIVE CV LAB;  Service: Cardiovascular; Mild nonobstructive CAD with 85% smooth narrowing in the first diagonal branch of the LAD; 10% smooth narrowing of the ostial proximal left circumflex coronary  artery; and a normal dominant RCA.   CARPAL TUNNEL RELEASE     CATARACT EXTRACTION Bilateral    CHOLECYSTECTOMY  2002   COLONOSCOPY  10/24/00; 08/27/19   2002 normal.  [employer] hemoccult testing via [facility] 06/18/15 was NEG.  2020->diverticulosis o/w nl.   dexa  03/2016; 03/2018   03/2018 T-score L femoral neck  -2.7 (worsened compared to osteopenic range 2017.) DEXA 05/03/21 stable T score -2.6 on boniva x 2 yrs->plan rpt DEXA 2 yrs.   ESOPHAGOGASTRODUODENOSCOPY  08/27/2019   Normal.   IR GENERIC HISTORICAL  09/19/2016   IR SINUS/FIST TUBE CHK-NON GI 09/19/2016 Gilmer Mor, DO [facility]   IR GENERIC HISTORICAL  10/06/2016   IR RADIOLOGIST EVAL & MGMT 10/06/2016 [facility]   IR GENERIC HISTORICAL  10/26/2016   IR RADIOLOGIST EVAL & MGMT 10/26/2016 [facility]   IR GENERIC HISTORICAL  11/03/2016   IR RADIOLOGIST EVAL & MGMT 11/03/2016 Gershon Crane, PA-C [facility] INTERV [facility]   IR GENERIC HISTORICAL  11/24/2016   IR RADIOLOGIST EVAL & MGMT 11/24/2016 Gershon Crane, PA-C [facility]   IR GENERIC HISTORICAL  12/05/2016   Fistula smaller/improving.  IR SINUS/FIST TUBE CHK-NON GI 12/05/2016 Simonne Come, MD [facility]   IR GENERIC HISTORICAL  12/22/2016   IR RADIOLOGIST EVAL & MGMT 12/22/2016 Berdine Dance, MD [facility]   IR RADIOLOGIST EVAL & MGMT  01/10/2017   IR RADIOLOGIST EVAL & MGMT  02/09/2017   IR SINUS/FIST TUBE CHK-NON GI  03/28/2017   LEFT HEART CATHETERIZATION WITH CORONARY  ANGIOGRAM N/A 05/30/2012   Procedure: LEFT HEART CATHETERIZATION WITH CORONARY ANGIOGRAM;  Surgeon: Kathleene Hazel, MD;  Location: [facility] CATH LAB;  Service: Cardiovascular: Mild, non-obstructive CAD   TRANSTHORACIC ECHOCARDIOGRAM  09/14/2016; 11/2016   08/2016: EF 30-35 %, Akinesis of the mid-apicalanteroseptal myocardium.  Grade I DD.  Mild pulm HTN.  Repeat echo 11/2016, EF 40-45%, no rwma, nl RV fxn, mild TR, PASP .     TRANSTHORACIC ECHOCARDIOGRAM  03/07/2017   EF 50-55%. Hypokinesis of the distal septum with overall low normal LV,  systolic function; mild diastolic dysfunction with elevated LV  filling pressure; mildly calcified aortic valve with mild AI; small pericardial effusion   TRANSTHORACIC ECHOCARDIOGRAM     04/2022 EF 60-65%, no WMA, no valve probs   Zio patch  11/2022   3 day:  primarily sinus, avg HR 97, brief run SVT, rare PVC     Current Meds  Medication Sig   acetaminophen (TYLENOL) 500 MG tablet Take 1,000 mg by mouth daily as needed for headache.   albuterol (PROVENTIL) (2.5 MG/3ML) 0.083% nebulizer solution USE 1 VIAL IN NEBULIZER EVERY 4 HOURS AS NEEDED FOR WHEEZING AND FOR SHORTNESS OF BREATH   albuterol (VENTOLIN HFA) 108 (90 Base) MCG/ACT inhaler INHALE 2 PUFFS BY MOUTH EVERY 6 HOURS AS NEEDED FOR WHEEZING OR SHORTNESS OF BREATH   aspirin 81 MG EC tablet Take 81 mg by mouth daily.    atorvastatin (LIPITOR) 40 MG tablet Take 1 tablet (40 mg total) by mouth daily.   budesonide-formoterol (SYMBICORT) 160-4.5 MCG/ACT inhaler Inhale 2 puffs into the lungs 2 (two) times daily.   buPROPion (WELLBUTRIN XL) 300 MG 24 hr tablet Take 1 tablet (300 mg total) by mouth daily.   Calcium Carb-Cholecalciferol (CALCIUM + VITAMIN D3 PO) Take 1-2 tablets by mouth See admin instructions. Take 1 tablet by mouth in the morning and 2 tablets by mouth at bedtime  cetirizine (ZYRTEC) 10 MG tablet Take 10 mg by mouth every evening.   HUMULIN 70/30 KWIKPEN (70-30) 100 UNIT/ML KwikPen  INJECT 4 UNITS SUBCUTANEOUSLY WITH BREAKFAST AND 2 UNITS WITH SUPPER   ibandronate (BONIVA) 150 MG tablet Take 1 tablet (150 mg total) by mouth every 30 (thirty) days. Take in the morning with a full glass of water, on an empty stomach, and do not take anything else by mouth or lie down for the next 30 min.   ketorolac (ACULAR) 0.5 % ophthalmic solution Place 1 drop into the left eye 4 (four) times daily.   Multiple Vitamins-Minerals (MULTIVITAMIN WOMEN PO) Take 1 tablet by mouth daily.   ONETOUCH ULTRA test strip See admin instructions.   pantoprazole (PROTONIX) 40 MG tablet Take 1 tablet (40 mg total) by mouth 2 (two) times daily.   prednisoLONE acetate (PRED FORTE) 1 % ophthalmic suspension Place 1 drop into the left eye.   Spacer/Aero-Holding Chambers (AEROCHAMBER MV) inhaler Use as instructed     Allergies:   Lactose intolerance (gi) and Motrin [ibuprofen]   Social History   Tobacco Use   Smoking status: Former    Packs/day: 1.00    Years: 45.00    Additional pack years: 0.00    Total pack years: 45.00    Types: Cigarettes    Quit date: 10/24/2008    Years since quitting: 14.2   Smokeless tobacco: Never  Vaping Use   Vaping Use: Never used  Substance Use Topics   Alcohol use: No    Alcohol/week: 0.0 standard drinks of alcohol   Drug use: No     Family Hx: The patient's family history includes Diabetes in her brother, father, and mother; Heart attack in her brother, brother, and brother; Heart disease in her brother; Heart failure in her mother; Kidney disease in her mother; Pancreatic cancer in her brother. There is no history of Colon cancer, Esophageal cancer, Stomach cancer, or Rectal cancer.   Labs/Other Tests and Data Reviewed:    EKG:  No ECG reviewed.  Recent Labs: 08/13/2022: Magnesium 2.3 10/03/2022: TSH 1.30 01/10/2023: ALT 12; BUN 21; Creatinine, Ser 1.08; Hemoglobin 12.4; Platelets 270.0; Potassium 4.1; Sodium 135   Recent Lipid Panel Lab Results   Component Value Date/Time   CHOL 130 02/02/2022 10:33 AM   CHOL 111 09/19/2018 09:18 AM   TRIG 111.0 02/02/2022 10:33 AM   TRIG 327 (HH) 08/15/2006 04:01 PM   HDL 52.10 02/02/2022 10:33 AM   HDL 34 (L) 09/19/2018 09:18 AM   CHOLHDL 2 02/02/2022 10:33 AM   LDLCALC 55 02/02/2022 10:33 AM   LDLCALC 48 09/19/2018 09:18 AM   LDLDIRECT 162.0 07/17/2015 08:23 AM    Wt Readings from Last 3 Encounters:  01/30/23 136 lb (61.7 kg)  01/24/23 134 lb 6.4 oz (61 kg)  01/10/23 135 lb (61.2 kg)     Objective:    Vital Signs:  BP (!) 97/57   Pulse 95   Ht 5' (1.524 m)   Wt 136 lb (61.7 kg)   BMI 26.56 kg/m   VITAL SIGNS:  reviewed GEN:  no acute distress RESPIRATORY:  prolonged coughing spell on initial evaluation PSYCH:  normal affect  ==========================================  COVID-19 Education: The signs and symptoms of COVID-19 were discussed with the patient and how to seek care for testing (follow up with PCP or arrange E-visit).   The importance of social distancing was discussed today.  Time:   Today, I have spent 11 minutes with the patient with  telehealth technology discussing the above problems.   An additional 10 minutes spent charting (reviewing prior notes, hospital records, studies, labs etc.) Total 21 minutes   Medication Adjustments/Labs and Tests Ordered: Current medicines are reviewed at length with the patient today.  Concerns regarding medicines are outlined above.   Patient Instructions  Medication Instructions:  No change *If you need a refill on your cardiac medications before your next appointment, please call your pharmacy*   Lab Work: none If you have labs (blood work) drawn today and your tests are completely normal, you will receive your results only by: MyChart Message (if you have MyChart) OR A paper copy in the mail If you have any lab test that is abnormal or we need to change your treatment, we will call you to review the  results.   Testing/Procedures: none   Follow-Up: At [facility], you and your health needs are our priority.  As part of our continuing mission to provide you with exceptional heart care, we have created designated Provider Care Teams.  These Care Teams include your primary Cardiologist (physician) and Advanced Practice Providers (APPs -  Physician Assistants and Nurse Practitioners) who all work together to provide you with the care you need, when you need it.  We recommend signing up for the patient portal called "MyChart".  Sign up information is provided on this After Visit Summary.  MyChart is used to connect with patients for Virtual Visits (Telemedicine).  Patients are able to view lab/test results, encounter notes, upcoming appointments, etc.  Non-urgent messages can be sent to your provider as well.   To learn more about what you can do with MyChart, go to [email or web address].    Your next appointment:   9 month(s)  Provider:   Bryan Lemma, MD     Other Instructions Ensure adequate hydration --> take your time to gain balance when going fromsitting to standing    Signed, Rachael Lemma, MD  02/04/2023 5:05 PM    [facility]

## 2023-01-30 NOTE — Patient Instructions (Signed)
Medication Instructions:  No change *If you need a refill on your cardiac medications before your next appointment, please call your pharmacy*   Lab Work: none If you have labs (blood work) drawn today and your tests are completely normal, you will receive your results only by: MyChart Message (if you have MyChart) OR A paper copy in the mail If you have any lab test that is abnormal or we need to change your treatment, we will call you to review the results.   Testing/Procedures: none   Follow-Up: At [facility], you and your health needs are our priority.  As part of our continuing mission to provide you with exceptional heart care, we have created designated Provider Care Teams.  These Care Teams include your primary Cardiologist (physician) and Advanced Practice Providers (APPs -  Physician Assistants and Nurse Practitioners) who all work together to provide you with the care you need, when you need it.  We recommend signing up for the patient portal called "MyChart".  Sign up information is provided on this After Visit Summary.  MyChart is used to connect with patients for Virtual Visits (Telemedicine).  Patients are able to view lab/test results, encounter notes, upcoming appointments, etc.  Non-urgent messages can be sent to your provider as well.   To learn more about what you can do with MyChart, go to [email or web address].    Your next appointment:   9 month(s)  Provider:   Bryan Lemma, MD     Other Instructions Ensure adequate hydration --> take your time to gain balance when going fromsitting to standing

## 2023-02-04 ENCOUNTER — Encounter: Payer: Self-pay | Admitting: Cardiology

## 2023-02-04 NOTE — Assessment & Plan Note (Signed)
Trivial nonocclusive disease noted by cardiac catheterization in 2017.  Lipids being monitored by PCP.  Have not seen labs since April of last year, should be due this month.  Last year LDL was 55 on October statin 40 mg daily.  Tolerating well. She is on aspirin for prevention based on coronary calcification.  Not unreasonable, but should have no problem holding If it is bleeding or concerns preoperatively. With no active anginal symptoms, she is not on an anticoagulant medications.  Borderline blood pressures limit the ability of use of any beta-blocker or ARB.

## 2023-02-04 NOTE — Assessment & Plan Note (Signed)
No reactive CHF symptoms I think her dyspnea is probably more related to underlying lung disease and currently her allergies are bothering her. With her borderline hypotension at this point in time, I would not put her on any afterload reduction or beta-blocker.

## 2023-02-04 NOTE — Assessment & Plan Note (Signed)
Essentially resolved.  She had been on low-dose beta-blocker, but no longer.  BP is not able to tolerate either ACE inhibitor or ARB.,

## 2023-02-04 NOTE — Assessment & Plan Note (Signed)
Monitor did not show any significant tachycardic spells.  She does have a resting heart rate of 94 bpm which needs to be compensatory with her low BP.  She had 1 short atrial run.  Relatively benign monitor results.  I have surprised if she has an elevated resting heart rate in the 90s with low blood pressures in the 90s.  Continue to monitor, but has not at present would not be concerned.

## 2023-02-04 NOTE — Assessment & Plan Note (Signed)
Pretty significant history of orthostatic hypotension, tends to maybe be a little volume down.  Not hydrating enough..  Stressed importance of adequate hydration.  It seems that her blood pressures now are lower and therefore does not meet criteria for hypertensive anymore.  No room for beta-blockade or rectal reduction.

## 2023-02-14 NOTE — Patient Instructions (Signed)
It was very nice to see you today!   PLEASE NOTE:   If you had any lab tests please let us know if you have not heard back within a few days. You may see your results on MyChart before we have a chance to review them but we will give you a call once they are reviewed by us. If we ordered any referrals today, please let us know if you have not heard from their office within the next 2 weeks. You should receive a letter via MyChart confirming if the referral was approved and their office contact information to schedule.  

## 2023-02-17 ENCOUNTER — Encounter: Payer: Self-pay | Admitting: Family Medicine

## 2023-02-17 ENCOUNTER — Ambulatory Visit (INDEPENDENT_AMBULATORY_CARE_PROVIDER_SITE_OTHER): Payer: Medicare Other | Admitting: Family Medicine

## 2023-02-17 VITALS — BP 111/73 | HR 105 | Temp 97.0°F | Ht 60.0 in | Wt 134.6 lb

## 2023-02-17 DIAGNOSIS — Z794 Long term (current) use of insulin: Secondary | ICD-10-CM

## 2023-02-17 DIAGNOSIS — E11319 Type 2 diabetes mellitus with unspecified diabetic retinopathy without macular edema: Secondary | ICD-10-CM

## 2023-02-17 DIAGNOSIS — E118 Type 2 diabetes mellitus with unspecified complications: Secondary | ICD-10-CM | POA: Diagnosis not present

## 2023-02-17 LAB — POCT GLYCOSYLATED HEMOGLOBIN (HGB A1C)
HbA1c POC (<> result, manual entry): 6.5 % (ref 4.0–5.6)
HbA1c, POC (controlled diabetic range): 6.5 % (ref 0.0–7.0)
HbA1c, POC (prediabetic range): 6.5 % — AB (ref 5.7–6.4)
Hemoglobin A1C: 6.5 % — AB (ref 4.0–5.6)

## 2023-02-17 MED ORDER — HUMULIN 70/30 KWIKPEN (70-30) 100 UNIT/ML ~~LOC~~ SUPN: PEN_INJECTOR | SUBCUTANEOUS | 3 refills | Status: DC

## 2023-02-17 NOTE — Progress Notes (Signed)
OFFICE VISIT  02/17/2023  CC:  Chief Complaint  Patient presents with   Medical Management of Chronic Issues    Daughter Eather Colas is with pt today; not fasting    Patient is a 77 y.o. female who presents for follow-up diabetes.  INTERIM HX: She is feeling well other than some coughing lately.  She is getting her albuterol regularly and responds well.  Denies palpitations. Recent follow-up with Dr. Herbie Baltimore was reassuring regarding her tachycardia workup.  Nothing further recommended.  Taking 4 units of insulin 70/30 twice a day.   Past Medical History:  Diagnosis Date   Acute renal failure (HCC)    Age-related nuclear cataract of both eyes 2016   +cortical age related cataracts OU    Anxiety and depression    Bilateral diabetic retinopathy (HCC) 2015   Dr. 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   Colonic diverticular abscess    COPD (chronic obstructive pulmonary disease) (HCC)    CVA (cerebral vascular accident) (HCC) 09/2016   MRI did show a left-sided small ischemic stroke which was acute but likely incidental (MRI was done b/c pt had TIA sx's in hospital).  Neuro put her on plavix at that time.  Cardiology d/c'd her plavix 08/2017.   Diabetes mellitus with complication (HCC)    diab retinopathy OU (laser)   Diverticulitis of large intestine with perforation and abscess 09/11/2016   Finger fracture, right 04/2020   R 5th metacarpal fx at the base (Dr. Dion Saucier)   GERD (gastroesophageal reflux disease)    protonix   History of concussion 08/25/2018   w/out loss of consciousness--mild increase in baseline memory impairment after.  CT head neg acute.   History of diverticulitis of colon    with abscess; required IR percutaneous drain placed 09/2016.   History of iron deficiency anemia     started iron 07/02/19.  EGD/colonoscopy 08/27/19 unrevealing. Capsule endoscopy considered but never done.   Hyperlipidemia    Hypertension    Left bundle branch block (LBBB) 12/16/2016   Lung cancer (HCC)    non-small cell lung ca, stage III in 05/2011; systemic chemotherapy concurrent with radiation followed by prophylactic cranial irradiation and has been observation since July of 2010 with no evidence for disease recurrence-released from onc f/u 12/2014 (needs annual cxr by PCP).  CXR 08/2015 stable.  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 Shirline Frees did bx and this was NEG for malignancy.   Mild cognitive impairment with memory loss    Likely from brain radiation therapy   Non-obstructive CAD (coronary artery disease)    a. Cath 2006 preserved LV fxn, scattered irregularities without critical stenosis; b. 2008 stress echo negative for ischemia, but with hypertensive response; c. 08/2016 Cath: D1 25%, otw nl.   Normocytic anemia 2018   Iron, vit B12, folate all normal 12/2016.   Open-angle glaucoma 2016   Dr. Hanley Seamen, (bilateral)---responding to topical therapy   Osteoarthritis of both hands    Osteoporosis 03/2016; 03/2018   03/2016 DEXA T-score -2.2.  03/2018 T-score L femoral neck  -2.7--boniva started. DEXA 04/2021 T scor -2.6->boniva continued. Plan rpt DEXA 2 yrs.   Pneumonia    hospitalization 11/2016; hypoxemic resp failure--d/c'd home with home oxygen therapy   Pulmonary nodule, left 08/2021   LUL 11 mm ground glass, needs  f/u CT 08/2022.   Seasonal allergic rhinitis    Subclinical hyperthyroidism 2018   Takotsubo cardiomyopathy    a. 08/2016 Echo: EF 30-35%, gr1DD, PASP ;  b. 08/2016 Cath: nonobs Dzs;  c. 11/2016 Echo: EF 40-45%, no rwma, nl RV fxn, mild TR, PASP .  ECHO 02/2017 EF 50-55%, grd II DD---essentially resolved Takotsubo 02/2017.   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 08/2016 in setting of diverticulitis & pneumoperitoneum and Takotsubo CM -->prolonged QT, seen by  EP-->avoid meds with potential for QT prolongation.    Past Surgical History:  Procedure Laterality Date   ABDOMINAL HYSTERECTOMY  1997   APPENDECTOMY     CARDIAC CATHETERIZATION N/A 09/14/2016   Procedure: Left Heart Cath and Coronary Angiography;  Surgeon: Lennette Bihari, MD;  Location: [facility] INVASIVE CV LAB;  Service: Cardiovascular; Mild nonobstructive CAD with 85% smooth narrowing in the first diagonal branch of the LAD; 10% smooth narrowing of the ostial proximal left circumflex coronary artery; and a normal dominant RCA.   CARPAL TUNNEL RELEASE     CATARACT EXTRACTION Bilateral    CHOLECYSTECTOMY  2002   COLONOSCOPY  10/24/00; 08/27/19   2002 normal.  [employer] hemoccult testing via [facility] 06/18/15 was NEG.  2020->diverticulosis o/w nl.   dexa  03/2016; 03/2018   03/2018 T-score L femoral neck  -2.7 (worsened compared to osteopenic range 2017.) DEXA 05/03/21 stable T score -2.6 on boniva x 2 yrs->plan rpt DEXA 2 yrs.   ESOPHAGOGASTRODUODENOSCOPY  08/27/2019   Normal.   IR GENERIC HISTORICAL  09/19/2016   IR SINUS/FIST TUBE CHK-NON GI 09/19/2016 Gilmer Mor, DO [facility]   IR GENERIC HISTORICAL  10/06/2016   IR RADIOLOGIST EVAL & MGMT 10/06/2016 [facility]   IR GENERIC HISTORICAL  10/26/2016   IR RADIOLOGIST EVAL & MGMT 10/26/2016 [facility]   IR GENERIC HISTORICAL  11/03/2016   IR RADIOLOGIST EVAL & MGMT 11/03/2016 Gershon Crane, PA-C [facility] INTER[facility]   IR GENERIC HISTORICAL  11/24/2016   IR RADIOLOGIST EVAL & MGMT 11/24/2016 Gershon Crane, PA-C [facility]   IR GENERIC HISTORICAL  12/05/2016   Fistula smaller/improving.  IR SINUS/FIST TUBE CHK-NON GI 12/05/2016 Simonne Come, MD [facility]   IR GENERIC HISTORICAL  12/22/2016   IR RADIOLOGIST EVAL & MGMT 12/22/2016 Berdine Dance, MD [facility]   IR RADIOLOGIST EVAL & MGMT  01/10/2017   IR RADIOLOGIST EVAL & MGMT  02/09/2017   IR SINUS/FIST TUBE CHK-NON GI  03/28/2017   LEFT HEART CATHETERIZATION WITH  CORONARY ANGIOGRAM N/A 05/30/2012   Procedure: LEFT HEART CATHETERIZATION WITH CORONARY ANGIOGRAM;  Surgeon: Kathleene Hazel, MD;  Location: [facility] CATH LAB;  Service: Cardiovascular: Mild, non-obstructive CAD   TRANSTHORACIC ECHOCARDIOGRAM  09/14/2016; 11/2016   08/2016: EF 30-35 %, Akinesis of the mid-apicalanteroseptal myocardium.  Grade I DD.  Mild pulm HTN.  Repeat echo 11/2016, EF 40-45%, no rwma, nl RV fxn, mild TR, PASP .     TRANSTHORACIC ECHOCARDIOGRAM  03/07/2017   EF 50-55%. Hypokinesis of the distal septum with overall low normal LV,  systolic function; mild diastolic dysfunction with elevated LV  filling pressure; mildly calcified aortic valve with mild AI; small pericardial effusion   TRANSTHORACIC ECHOCARDIOGRAM     04/2022 EF 60-65%, no WMA, no valve probs   Zio patch  11/2022   3 day:  primarily sinus, avg HR 97, brief run SVT, rare PVC    Outpatient Medications Prior to Visit  Medication Sig Dispense Refill   acetaminophen (TYLENOL) 500 MG tablet Take 1,000 mg by mouth daily as needed for headache.     albuterol (PROVENTIL) (2.5 MG/3ML) 0.083% nebulizer solution USE 1 VIAL IN NEBULIZER EVERY 4 HOURS AS NEEDED FOR WHEEZING AND FOR SHORTNESS OF BREATH 90 mL 1   albuterol (VENTOLIN HFA) 108 (90 Base) MCG/ACT inhaler INHALE 2 PUFFS BY MOUTH EVERY 6 HOURS AS NEEDED FOR WHEEZING OR SHORTNESS OF BREATH 18 g 1   aspirin 81 MG EC tablet Take 81 mg by mouth daily.      atorvastatin (LIPITOR) 40 MG tablet Take 1 tablet (40 mg total) by mouth daily. 90 tablet 1   budesonide-formoterol (SYMBICORT) 160-4.5 MCG/ACT inhaler Inhale 2 puffs into the lungs 2 (two) times daily. 1 each 6   buPROPion (WELLBUTRIN XL) 300 MG 24 hr tablet Take 1 tablet (300 mg total) by mouth daily. 90 tablet 1   Calcium Carb-Cholecalciferol (CALCIUM + VITAMIN D3 PO) Take 1-2 tablets by mouth See admin instructions. Take 1 tablet by mouth in the morning and 2 tablets by mouth at bedtime     cetirizine (ZYRTEC)  10 MG tablet Take 10 mg by mouth every evening.     ibandronate (BONIVA) 150 MG tablet Take 1 tablet (150 mg total) by mouth every 30 (thirty) days. Take in the morning with a full glass of water, on an empty stomach, and do not take anything else by mouth or lie down for the next 30 min. 3 tablet 3   ketorolac (ACULAR) 0.5 % ophthalmic solution Place 1 drop into the left eye 4 (four) times daily.     Multiple Vitamins-Minerals (MULTIVITAMIN WOMEN PO) Take 1 tablet by mouth daily.     ONETOUCH ULTRA test strip See admin instructions.     pantoprazole (PROTONIX) 40 MG tablet Take 1 tablet (40 mg total) by mouth 2 (two) times daily. 180 tablet 1   prednisoLONE acetate (PRED FORTE) 1 % ophthalmic suspension Place 1 drop into the left eye.     Spacer/Aero-Holding Chambers (AEROCHAMBER MV) inhaler Use as instructed 1 each 2   HUMULIN 70/30 KWIKPEN (70-30) 100 UNIT/ML KwikPen INJECT 4 UNITS SUBCUTANEOUSLY WITH BREAKFAST AND 2 UNITS WITH SUPPER 15 mL 0   No facility-administered medications prior to visit.    Allergies  Allergen Reactions   Lactose Intolerance (Gi) Diarrhea   Motrin [Ibuprofen] Itching, Anxiety and Other (See Comments)    Anxiousness Hyperventilates     Review of Systems As per HPI  PE:    02/17/2023    1:13 PM 01/30/2023   10:51 AM 01/24/2023   10:39 AM  Vitals with BMI  Height 5\' 0"  5\' 0"    Weight 134 lbs 10 oz 136 lbs 134 lbs 6 oz  BMI 26.29 26.56 24.58  Systolic 111 97 99  Diastolic 73 57 63  Pulse 105 95 110     Physical Exam  Gen: Alert, well appearing.  Patient is oriented to person, place, time, and situation. AFFECT: pleasant, lucid thought and speech. CV: Regular rhythm, rate 100-110, no m/r/g.   LUNGS: CTA bilat, nonlabored resps, good aeration in all lung fields.   LABS:  Last CBC Lab Results  Component Value Date   WBC 9.7 01/10/2023   HGB 12.4 01/10/2023   HCT 38.6 01/10/2023   MCV 82.1 01/10/2023   MCH 28.6 10/18/2022   RDW 17.5 (H)  01/10/2023   PLT 270.0 01/10/2023   Last metabolic panel Lab Results  Component  Value Date   GLUCOSE 269 (H) 01/10/2023   NA 135 01/10/2023   K 4.1 01/10/2023   CL 99 01/10/2023   CO2 25 01/10/2023   BUN 21 01/10/2023   CREATININE 1.08 01/10/2023   GFRNONAA 56 (L) 10/18/2022   CALCIUM 9.2 01/10/2023   PHOS 2.9 05/22/2022   PROT 6.6 01/10/2023   ALBUMIN 3.7 01/10/2023   LABGLOB 2.7 09/19/2018   AGRATIO 1.6 09/19/2018   BILITOT 0.4 01/10/2023   ALKPHOS 72 01/10/2023   AST 15 01/10/2023   ALT 12 01/10/2023   ANIONGAP 6 10/18/2022   Last lipids Lab Results  Component Value Date   CHOL 130 02/02/2022   HDL 52.10 02/02/2022   LDLCALC 55 02/02/2022   LDLDIRECT 162.0 07/17/2015   TRIG 111.0 02/02/2022   CHOLHDL 2 02/02/2022   Last hemoglobin A1c Lab Results  Component Value Date   HGBA1C 6.5 (A) 02/17/2023   HGBA1C 6.5 02/17/2023   HGBA1C 6.5 (A) 02/17/2023   HGBA1C 6.5 02/17/2023   Last thyroid functions Lab Results  Component Value Date   TSH 1.30 10/03/2022   T3TOTAL 94.7 04/20/2017   IMPRESSION AND PLAN:  Diabetes with history of retinopathy, doing well on 4 units 70/30 insulin twice a day. POC Hba1c today is 6.5%. No other labs needed today.  An After Visit Summary was printed and given to the patient.  FOLLOW UP: Return in about 3 months (around 05/19/2023) for annual CPE (fasting).   Signed:  Santiago Bumpers, MD           02/17/2023

## 2023-02-26 ENCOUNTER — Emergency Department (HOSPITAL_COMMUNITY): Payer: Medicare Other

## 2023-02-26 ENCOUNTER — Inpatient Hospital Stay (HOSPITAL_COMMUNITY)
Admission: EM | Admit: 2023-02-26 | Discharge: 2023-03-25 | DRG: 177 | Disposition: E | Payer: Medicare Other | Attending: Internal Medicine | Admitting: Internal Medicine

## 2023-02-26 ENCOUNTER — Other Ambulatory Visit: Payer: Self-pay

## 2023-02-26 DIAGNOSIS — E440 Moderate protein-calorie malnutrition: Secondary | ICD-10-CM | POA: Diagnosis not present

## 2023-02-26 DIAGNOSIS — E1165 Type 2 diabetes mellitus with hyperglycemia: Secondary | ICD-10-CM | POA: Diagnosis not present

## 2023-02-26 DIAGNOSIS — E11649 Type 2 diabetes mellitus with hypoglycemia without coma: Secondary | ICD-10-CM | POA: Diagnosis not present

## 2023-02-26 DIAGNOSIS — Z6826 Body mass index (BMI) 26.0-26.9, adult: Secondary | ICD-10-CM

## 2023-02-26 DIAGNOSIS — Z85118 Personal history of other malignant neoplasm of bronchus and lung: Secondary | ICD-10-CM

## 2023-02-26 DIAGNOSIS — J90 Pleural effusion, not elsewhere classified: Secondary | ICD-10-CM | POA: Diagnosis not present

## 2023-02-26 DIAGNOSIS — I272 Pulmonary hypertension, unspecified: Secondary | ICD-10-CM | POA: Diagnosis present

## 2023-02-26 DIAGNOSIS — R6889 Other general symptoms and signs: Secondary | ICD-10-CM | POA: Diagnosis not present

## 2023-02-26 DIAGNOSIS — Z7982 Long term (current) use of aspirin: Secondary | ICD-10-CM

## 2023-02-26 DIAGNOSIS — Z7189 Other specified counseling: Secondary | ICD-10-CM | POA: Diagnosis not present

## 2023-02-26 DIAGNOSIS — Z833 Family history of diabetes mellitus: Secondary | ICD-10-CM

## 2023-02-26 DIAGNOSIS — I472 Ventricular tachycardia, unspecified: Secondary | ICD-10-CM | POA: Diagnosis not present

## 2023-02-26 DIAGNOSIS — Z9049 Acquired absence of other specified parts of digestive tract: Secondary | ICD-10-CM

## 2023-02-26 DIAGNOSIS — E1169 Type 2 diabetes mellitus with other specified complication: Secondary | ICD-10-CM | POA: Diagnosis not present

## 2023-02-26 DIAGNOSIS — Z800 Family history of malignant neoplasm of digestive organs: Secondary | ICD-10-CM

## 2023-02-26 DIAGNOSIS — K219 Gastro-esophageal reflux disease without esophagitis: Secondary | ICD-10-CM | POA: Diagnosis present

## 2023-02-26 DIAGNOSIS — Z7951 Long term (current) use of inhaled steroids: Secondary | ICD-10-CM

## 2023-02-26 DIAGNOSIS — C3490 Malignant neoplasm of unspecified part of unspecified bronchus or lung: Secondary | ICD-10-CM | POA: Diagnosis not present

## 2023-02-26 DIAGNOSIS — R627 Adult failure to thrive: Secondary | ICD-10-CM | POA: Diagnosis present

## 2023-02-26 DIAGNOSIS — I350 Nonrheumatic aortic (valve) stenosis: Secondary | ICD-10-CM | POA: Diagnosis present

## 2023-02-26 DIAGNOSIS — Z923 Personal history of irradiation: Secondary | ICD-10-CM

## 2023-02-26 DIAGNOSIS — J9601 Acute respiratory failure with hypoxia: Secondary | ICD-10-CM

## 2023-02-26 DIAGNOSIS — I5033 Acute on chronic diastolic (congestive) heart failure: Secondary | ICD-10-CM | POA: Insufficient documentation

## 2023-02-26 DIAGNOSIS — I3139 Other pericardial effusion (noninflammatory): Secondary | ICD-10-CM | POA: Diagnosis not present

## 2023-02-26 DIAGNOSIS — I700 Atherosclerosis of aorta: Secondary | ICD-10-CM | POA: Diagnosis not present

## 2023-02-26 DIAGNOSIS — I509 Heart failure, unspecified: Secondary | ICD-10-CM

## 2023-02-26 DIAGNOSIS — N1831 Chronic kidney disease, stage 3a: Secondary | ICD-10-CM | POA: Diagnosis present

## 2023-02-26 DIAGNOSIS — Z515 Encounter for palliative care: Secondary | ICD-10-CM

## 2023-02-26 DIAGNOSIS — I5023 Acute on chronic systolic (congestive) heart failure: Secondary | ICD-10-CM | POA: Diagnosis not present

## 2023-02-26 DIAGNOSIS — R5382 Chronic fatigue, unspecified: Secondary | ICD-10-CM | POA: Diagnosis present

## 2023-02-26 DIAGNOSIS — E785 Hyperlipidemia, unspecified: Secondary | ICD-10-CM | POA: Diagnosis present

## 2023-02-26 DIAGNOSIS — I5041 Acute combined systolic (congestive) and diastolic (congestive) heart failure: Secondary | ICD-10-CM | POA: Diagnosis not present

## 2023-02-26 DIAGNOSIS — I110 Hypertensive heart disease with heart failure: Secondary | ICD-10-CM | POA: Diagnosis not present

## 2023-02-26 DIAGNOSIS — I130 Hypertensive heart and chronic kidney disease with heart failure and stage 1 through stage 4 chronic kidney disease, or unspecified chronic kidney disease: Secondary | ICD-10-CM | POA: Diagnosis not present

## 2023-02-26 DIAGNOSIS — E1122 Type 2 diabetes mellitus with diabetic chronic kidney disease: Secondary | ICD-10-CM | POA: Diagnosis present

## 2023-02-26 DIAGNOSIS — Z66 Do not resuscitate: Secondary | ICD-10-CM | POA: Diagnosis present

## 2023-02-26 DIAGNOSIS — F0390 Unspecified dementia without behavioral disturbance: Secondary | ICD-10-CM | POA: Diagnosis present

## 2023-02-26 DIAGNOSIS — M810 Age-related osteoporosis without current pathological fracture: Secondary | ICD-10-CM | POA: Diagnosis present

## 2023-02-26 DIAGNOSIS — I10 Essential (primary) hypertension: Secondary | ICD-10-CM | POA: Diagnosis present

## 2023-02-26 DIAGNOSIS — I5181 Takotsubo syndrome: Secondary | ICD-10-CM | POA: Diagnosis present

## 2023-02-26 DIAGNOSIS — I2510 Atherosclerotic heart disease of native coronary artery without angina pectoris: Secondary | ICD-10-CM | POA: Diagnosis present

## 2023-02-26 DIAGNOSIS — I499 Cardiac arrhythmia, unspecified: Secondary | ICD-10-CM | POA: Diagnosis not present

## 2023-02-26 DIAGNOSIS — E039 Hypothyroidism, unspecified: Secondary | ICD-10-CM | POA: Diagnosis present

## 2023-02-26 DIAGNOSIS — J690 Pneumonitis due to inhalation of food and vomit: Principal | ICD-10-CM

## 2023-02-26 DIAGNOSIS — R062 Wheezing: Secondary | ICD-10-CM | POA: Diagnosis not present

## 2023-02-26 DIAGNOSIS — F32A Depression, unspecified: Secondary | ICD-10-CM | POA: Diagnosis not present

## 2023-02-26 DIAGNOSIS — Z8249 Family history of ischemic heart disease and other diseases of the circulatory system: Secondary | ICD-10-CM

## 2023-02-26 DIAGNOSIS — E871 Hypo-osmolality and hyponatremia: Secondary | ICD-10-CM | POA: Diagnosis present

## 2023-02-26 DIAGNOSIS — J479 Bronchiectasis, uncomplicated: Secondary | ICD-10-CM | POA: Diagnosis not present

## 2023-02-26 DIAGNOSIS — I4729 Other ventricular tachycardia: Secondary | ICD-10-CM | POA: Diagnosis present

## 2023-02-26 DIAGNOSIS — J449 Chronic obstructive pulmonary disease, unspecified: Secondary | ICD-10-CM | POA: Diagnosis not present

## 2023-02-26 DIAGNOSIS — Z79899 Other long term (current) drug therapy: Secondary | ICD-10-CM

## 2023-02-26 DIAGNOSIS — I447 Left bundle-branch block, unspecified: Secondary | ICD-10-CM | POA: Diagnosis present

## 2023-02-26 DIAGNOSIS — Z885 Allergy status to narcotic agent status: Secondary | ICD-10-CM

## 2023-02-26 DIAGNOSIS — E11319 Type 2 diabetes mellitus with unspecified diabetic retinopathy without macular edema: Secondary | ICD-10-CM | POA: Diagnosis not present

## 2023-02-26 DIAGNOSIS — Z794 Long term (current) use of insulin: Secondary | ICD-10-CM

## 2023-02-26 DIAGNOSIS — Z1152 Encounter for screening for COVID-19: Secondary | ICD-10-CM

## 2023-02-26 DIAGNOSIS — Z9221 Personal history of antineoplastic chemotherapy: Secondary | ICD-10-CM

## 2023-02-26 DIAGNOSIS — E739 Lactose intolerance, unspecified: Secondary | ICD-10-CM | POA: Diagnosis present

## 2023-02-26 DIAGNOSIS — B348 Other viral infections of unspecified site: Secondary | ICD-10-CM | POA: Diagnosis present

## 2023-02-26 DIAGNOSIS — Z90710 Acquired absence of both cervix and uterus: Secondary | ICD-10-CM

## 2023-02-26 DIAGNOSIS — N183 Chronic kidney disease, stage 3 unspecified: Secondary | ICD-10-CM | POA: Diagnosis present

## 2023-02-26 DIAGNOSIS — F419 Anxiety disorder, unspecified: Secondary | ICD-10-CM | POA: Diagnosis present

## 2023-02-26 DIAGNOSIS — Z87891 Personal history of nicotine dependence: Secondary | ICD-10-CM

## 2023-02-26 DIAGNOSIS — B9710 Unspecified enterovirus as the cause of diseases classified elsewhere: Secondary | ICD-10-CM | POA: Diagnosis present

## 2023-02-26 DIAGNOSIS — Z743 Need for continuous supervision: Secondary | ICD-10-CM | POA: Diagnosis not present

## 2023-02-26 DIAGNOSIS — Z8673 Personal history of transient ischemic attack (TIA), and cerebral infarction without residual deficits: Secondary | ICD-10-CM

## 2023-02-26 DIAGNOSIS — Z841 Family history of disorders of kidney and ureter: Secondary | ICD-10-CM

## 2023-02-26 DIAGNOSIS — J441 Chronic obstructive pulmonary disease with (acute) exacerbation: Secondary | ICD-10-CM

## 2023-02-26 DIAGNOSIS — N1832 Chronic kidney disease, stage 3b: Secondary | ICD-10-CM | POA: Diagnosis not present

## 2023-02-26 DIAGNOSIS — B9789 Other viral agents as the cause of diseases classified elsewhere: Secondary | ICD-10-CM | POA: Diagnosis present

## 2023-02-26 DIAGNOSIS — J939 Pneumothorax, unspecified: Secondary | ICD-10-CM | POA: Diagnosis not present

## 2023-02-26 DIAGNOSIS — R0602 Shortness of breath: Secondary | ICD-10-CM | POA: Diagnosis not present

## 2023-02-26 LAB — COMPREHENSIVE METABOLIC PANEL
ALT: 15 U/L (ref 0–44)
AST: 24 U/L (ref 15–41)
Albumin: 3.2 g/dL — ABNORMAL LOW (ref 3.5–5.0)
Alkaline Phosphatase: 72 U/L (ref 38–126)
Anion gap: 12 (ref 5–15)
BUN: 10 mg/dL (ref 8–23)
CO2: 23 mmol/L (ref 22–32)
Calcium: 8.9 mg/dL (ref 8.9–10.3)
Chloride: 91 mmol/L — ABNORMAL LOW (ref 98–111)
Creatinine, Ser: 1.05 mg/dL — ABNORMAL HIGH (ref 0.44–1.00)
GFR, Estimated: 56 mL/min — ABNORMAL LOW (ref >60)
Glucose, Bld: 154 mg/dL — ABNORMAL HIGH (ref 70–99)
Potassium: 4.5 mmol/L (ref 3.5–5.1)
Sodium: 126 mmol/L — ABNORMAL LOW (ref 135–145)
Total Bilirubin: 0.2 mg/dL — ABNORMAL LOW (ref 0.3–1.2)
Total Protein: 6.6 g/dL (ref 6.5–8.1)

## 2023-02-26 LAB — CBC WITH DIFFERENTIAL/PLATELET
Abs Immature Granulocytes: 0.02 10*3/uL (ref 0.00–0.07)
Basophils Absolute: 0 10*3/uL (ref 0.0–0.1)
Basophils Relative: 0 %
Eosinophils Absolute: 0 10*3/uL (ref 0.0–0.5)
Eosinophils Relative: 0 %
HCT: 39.1 % (ref 36.0–46.0)
Hemoglobin: 12.1 g/dL (ref 12.0–15.0)
Immature Granulocytes: 0 %
Lymphocytes Relative: 13 %
Lymphs Abs: 1.1 10*3/uL (ref 0.7–4.0)
MCH: 26.7 pg (ref 26.0–34.0)
MCHC: 30.9 g/dL (ref 30.0–36.0)
MCV: 86.3 fL (ref 80.0–100.0)
Monocytes Absolute: 1 10*3/uL (ref 0.1–1.0)
Monocytes Relative: 11 %
Neutro Abs: 6.4 10*3/uL (ref 1.7–7.7)
Neutrophils Relative %: 76 %
Platelets: 282 10*3/uL (ref 150–400)
RBC: 4.53 MIL/uL (ref 3.87–5.11)
RDW: 18.4 % — ABNORMAL HIGH (ref 11.5–15.5)
WBC: 8.6 10*3/uL (ref 4.0–10.5)
nRBC: 0 % (ref 0.0–0.2)

## 2023-02-26 LAB — I-STAT VENOUS BLOOD GAS, ED
Acid-base deficit: 1 mmol/L (ref 0.0–2.0)
Bicarbonate: 26.7 mmol/L (ref 20.0–28.0)
Calcium, Ion: 1.19 mmol/L (ref 1.15–1.40)
HCT: 40 % (ref 36.0–46.0)
Hemoglobin: 13.6 g/dL (ref 12.0–15.0)
O2 Saturation: 64 %
Potassium: 4.6 mmol/L (ref 3.5–5.1)
Sodium: 129 mmol/L — ABNORMAL LOW (ref 135–145)
TCO2: 28 mmol/L (ref 22–32)
pCO2, Ven: 54.1 mmHg (ref 44–60)
pH, Ven: 7.301 (ref 7.25–7.43)
pO2, Ven: 37 mmHg (ref 32–45)

## 2023-02-26 LAB — TROPONIN I (HIGH SENSITIVITY)
Troponin I (High Sensitivity): 19 ng/L — ABNORMAL HIGH (ref <18)
Troponin I (High Sensitivity): 17 ng/L (ref <18)

## 2023-02-26 LAB — CBG MONITORING, ED: Glucose-Capillary: 205 mg/dL — ABNORMAL HIGH (ref 70–99)

## 2023-02-26 LAB — BRAIN NATRIURETIC PEPTIDE: B Natriuretic Peptide: 889.7 pg/mL — ABNORMAL HIGH (ref 0.0–100.0)

## 2023-02-26 LAB — I-STAT CHEM 8, ED
BUN: 11 mg/dL (ref 8–23)
Calcium, Ion: 1.18 mmol/L (ref 1.15–1.40)
Chloride: 91 mmol/L — ABNORMAL LOW (ref 98–111)
Creatinine, Ser: 0.9 mg/dL (ref 0.44–1.00)
Glucose, Bld: 154 mg/dL — ABNORMAL HIGH (ref 70–99)
HCT: 41 % (ref 36.0–46.0)
Hemoglobin: 13.9 g/dL (ref 12.0–15.0)
Potassium: 4.6 mmol/L (ref 3.5–5.1)
Sodium: 129 mmol/L — ABNORMAL LOW (ref 135–145)
TCO2: 27 mmol/L (ref 22–32)

## 2023-02-26 LAB — RESP PANEL BY RT-PCR (RSV, FLU A&B, COVID)  RVPGX2
Influenza A by PCR: NEGATIVE
Influenza B by PCR: NEGATIVE
Resp Syncytial Virus by PCR: NEGATIVE
SARS Coronavirus 2 by RT PCR: NEGATIVE

## 2023-02-26 MED ORDER — MAGNESIUM SULFATE 2 GM/50ML IV SOLN
2.0000 g | Freq: Once | INTRAVENOUS | Status: AC
  Administered 2023-02-26: 2 g via INTRAVENOUS
  Filled 2023-02-26: qty 50

## 2023-02-26 MED ORDER — ALBUTEROL SULFATE (2.5 MG/3ML) 0.083% IN NEBU
15.0000 mg/h | INHALATION_SOLUTION | Freq: Once | RESPIRATORY_TRACT | Status: AC
  Administered 2023-02-26: 15 mg/h via RESPIRATORY_TRACT
  Filled 2023-02-26: qty 18

## 2023-02-26 MED ORDER — INSULIN ASPART 100 UNIT/ML IJ SOLN: 0.0000 [IU] | Freq: Three times a day (TID) | INTRAMUSCULAR | Status: DC

## 2023-02-26 MED ORDER — ALBUTEROL SULFATE (2.5 MG/3ML) 0.083% IN NEBU
2.5000 mg | INHALATION_SOLUTION | RESPIRATORY_TRACT | Status: DC | PRN
  Administered 2023-03-01 – 2023-03-02 (×2): 2.5 mg via RESPIRATORY_TRACT
  Filled 2023-02-26 (×2): qty 3

## 2023-02-26 MED ORDER — METHYLPREDNISOLONE SODIUM SUCC 125 MG IJ SOLR
125.0000 mg | Freq: Once | INTRAMUSCULAR | Status: AC
  Administered 2023-02-26: 125 mg via INTRAVENOUS
  Filled 2023-02-26: qty 2

## 2023-02-26 MED ORDER — IPRATROPIUM-ALBUTEROL 0.5-2.5 (3) MG/3ML IN SOLN
3.0000 mL | RESPIRATORY_TRACT | Status: AC
  Administered 2023-02-26 – 2023-02-27 (×3): 3 mL via RESPIRATORY_TRACT
  Filled 2023-02-26 (×3): qty 3

## 2023-02-26 MED ORDER — INSULIN ASPART 100 UNIT/ML IJ SOLN
0.0000 [IU] | Freq: Every day | INTRAMUSCULAR | Status: DC
  Administered 2023-02-26: 2 [IU] via SUBCUTANEOUS

## 2023-02-26 MED ORDER — BUPROPION HCL ER (XL) 150 MG PO TB24
300.0000 mg | ORAL_TABLET | Freq: Every day | ORAL | Status: DC
  Administered 2023-02-27 – 2023-03-05 (×7): 300 mg via ORAL
  Filled 2023-02-26 (×5): qty 2
  Filled 2023-02-26: qty 1
  Filled 2023-02-26: qty 2

## 2023-02-26 MED ORDER — ASPIRIN 81 MG PO TBEC
81.0000 mg | DELAYED_RELEASE_TABLET | Freq: Every day | ORAL | Status: DC
  Administered 2023-02-27 – 2023-03-05 (×7): 81 mg via ORAL
  Filled 2023-02-26 (×7): qty 1

## 2023-02-26 MED ORDER — PANTOPRAZOLE SODIUM 40 MG PO TBEC
40.0000 mg | DELAYED_RELEASE_TABLET | Freq: Two times a day (BID) | ORAL | Status: DC
  Administered 2023-02-26: 40 mg via ORAL
  Filled 2023-02-26: qty 1

## 2023-02-26 MED ORDER — IPRATROPIUM BROMIDE 0.02 % IN SOLN
1.0000 mg | Freq: Once | RESPIRATORY_TRACT | Status: AC
  Administered 2023-02-26: 1 mg via RESPIRATORY_TRACT
  Filled 2023-02-26: qty 5

## 2023-02-26 MED ORDER — ATORVASTATIN CALCIUM 40 MG PO TABS
40.0000 mg | ORAL_TABLET | Freq: Every day | ORAL | Status: DC
  Administered 2023-02-27 – 2023-03-04 (×6): 40 mg via ORAL
  Filled 2023-02-26 (×6): qty 1

## 2023-02-26 MED ORDER — FUROSEMIDE 10 MG/ML IJ SOLN
40.0000 mg | Freq: Once | INTRAMUSCULAR | Status: AC
  Administered 2023-02-26: 40 mg via INTRAVENOUS
  Filled 2023-02-26: qty 4

## 2023-02-26 NOTE — ED Triage Notes (Signed)
Patient arrives via ems from home secondary to sob, patient was placed  on cpap and patient started vomiting. Hx chf and lung cancer, 4 mg zofran given prior arrival.

## 2023-02-26 NOTE — ED Notes (Signed)
ED TO INPATIENT HANDOFF REPORT  ED Nurse Name and Phone #:  [phone] Raynaldo Opitz Name/Age/Gender [name] 77 y.o. female Room/Bed: 030C/030C  Code Status   Code Status: Prior  Home/SNF/Other Home Patient oriented to: self, place, time, and situation Is this baseline? Yes   Triage Complete: Triage complete  Chief Complaint Acute respiratory failure with hypoxemia (HCC) [J96.01]  Triage Note Patient arrives via ems from home secondary to sob, patient was placed  on cpap and patient started vomiting. Hx chf and lung cancer, 4 mg zofran given prior arrival.    Allergies Allergies  Allergen Reactions   Lactose Intolerance (Gi) Diarrhea   Motrin [Ibuprofen] Itching, Anxiety and Other (See Comments)    Anxiousness Hyperventilates     Level of Care/Admitting Diagnosis ED Disposition     ED Disposition  Admit   Condition  --   Comment  Hospital Area: [facility] [[id number]]  Level of Care: Telemetry Medical [104]  May admit patient to [facility] or [facility] if equivalent level of care is available:: No  Covid Evaluation: Confirmed COVID Negative  Diagnosis: Acute respiratory failure with hypoxemia [facility]) [[id number]]  Admitting Physician: Gery Pray [[id number]]  Attending Physician: Gery Pray [[id number]]  Certification:: I certify this patient will need inpatient services for at least 2 midnights  Estimated Length of Stay: 2          B Medical/Surgery History Past Medical History:  Diagnosis Date   Acute renal failure (HCC)    Age-related nuclear cataract of both eyes 2016   +cortical age related cataracts OU    Anxiety and depression    Bilateral diabetic retinopathy (HCC) 2015   Dr. 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   Colonic diverticular  abscess    COPD (chronic obstructive pulmonary disease) (HCC)    CVA (cerebral vascular accident) (HCC) 09/2016   MRI did show a left-sided small ischemic stroke which was acute but likely incidental (MRI was done b/c pt had TIA sx's in hospital).  Neuro put her on plavix at that time.  Cardiology d/c'd her plavix 08/2017.   Diabetes mellitus with complication (HCC)    diab retinopathy OU (laser)   Diverticulitis of large intestine with perforation and abscess 09/11/2016   Finger fracture, right 04/2020   R 5th metacarpal fx at the base (Dr. Dion Saucier)   GERD (gastroesophageal reflux disease)    protonix   History of concussion 08/25/2018   w/out loss of consciousness--mild increase in baseline memory impairment after.  CT head neg acute.   History of diverticulitis of colon    with abscess; required IR percutaneous drain placed 09/2016.   History of iron deficiency anemia    started iron 07/02/19.  EGD/colonoscopy 08/27/19 unrevealing. Capsule endoscopy considered but never done.   Hyperlipidemia    Hypertension    Left bundle branch block (LBBB) 12/16/2016   Lung cancer (HCC)    non-small cell lung ca, stage III in 05/2011; systemic chemotherapy concurrent with radiation followed by prophylactic cranial irradiation and has been observation since July of 2010 with no evidence for disease recurrence-released from onc f/u 12/2014 (needs annual cxr by PCP).  CXR 08/2015 stable.  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 Shirline Frees did bx and this was NEG for malignancy.  Mild cognitive impairment with memory loss    Likely from brain radiation therapy   Non-obstructive CAD (coronary artery disease)    a. Cath 2006 preserved LV fxn, scattered irregularities without critical stenosis; b. 2008 stress echo negative for ischemia, but with hypertensive response; c. 08/2016 Cath: D1 25%, otw nl.   Normocytic anemia 2018   Iron, vit B12, folate all normal 12/2016.   Open-angle glaucoma 2016   Dr. Hanley Seamen,  (bilateral)---responding to topical therapy   Osteoarthritis of both hands    Osteoporosis 03/2016; 03/2018   03/2016 DEXA T-score -2.2.  03/2018 T-score L femoral neck  -2.7--boniva started. DEXA 04/2021 T scor -2.6->boniva continued. Plan rpt DEXA 2 yrs.   Pneumonia    hospitalization 11/2016; hypoxemic resp failure--d/c'd home with home oxygen therapy   Pulmonary nodule, left 08/2021   LUL 11 mm ground glass, needs f/u CT 08/2022.   Seasonal allergic rhinitis    Subclinical hyperthyroidism 2018   Takotsubo cardiomyopathy    a. 08/2016 Echo: EF 30-35%, gr1DD, PASP ;  b. 08/2016 Cath: nonobs Dzs;  c. 11/2016 Echo: EF 40-45%, no rwma, nl RV fxn, mild TR, PASP .  ECHO 02/2017 EF 50-55%, grd II DD---essentially resolved Takotsubo 02/2017.   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 08/2016 in setting of diverticulitis & pneumoperitoneum and Takotsubo CM -->prolonged QT, seen by EP-->avoid meds with potential for QT prolongation.   Past Surgical History:  Procedure Laterality Date   ABDOMINAL HYSTERECTOMY  1997   APPENDECTOMY     CARDIAC CATHETERIZATION N/A 09/14/2016   Procedure: Left Heart Cath and Coronary Angiography;  Surgeon: Lennette Bihari, MD;  Location: [facility] INVASIVE CV LAB;  Service: Cardiovascular; Mild nonobstructive CAD with 85% smooth narrowing in the first diagonal branch of the LAD; 10% smooth narrowing of the ostial proximal left circumflex coronary artery; and a normal dominant RCA.   CARPAL TUNNEL RELEASE     CATARACT EXTRACTION Bilateral    CHOLECYSTECTOMY  2002   COLONOSCOPY  10/24/00; 08/27/19   2002 normal.  [employer] hemoccult testing via [facility] 06/18/15 was NEG.  2020->diverticulosis o/w nl.   dexa  03/2016; 03/2018   03/2018 T-score L femoral neck  -2.7 (worsened compared to osteopenic range 2017.) DEXA 05/03/21 stable T score -2.6 on boniva x 2 yrs->plan rpt DEXA 2 yrs.   ESOPHAGOGASTRODUODENOSCOPY  08/27/2019   Normal.   IR GENERIC HISTORICAL  09/19/2016   IR SINUS/FIST  TUBE CHK-NON GI 09/19/2016 Gilmer Mor, DO [facility]   IR GENERIC HISTORICAL  10/06/2016   IR RADIOLOGIST EVAL & MGMT 10/06/2016 [facility]   IR GENERIC HISTORICAL  10/26/2016   IR RADIOLOGIST EVAL & MGMT 10/26/2016 [facility]   IR GENERIC HISTORICAL  11/03/2016   IR RADIOLOGIST EVAL & MGMT 11/03/2016 Gershon Crane, PA-C [facility] INTER[facility] RAD   IR GENERIC HISTORICAL  11/24/2016   IR RADIOLOGIST EVAL & MGMT 11/24/2016 Gershon Crane, PA-C [facility]   IR GENERIC HISTORICAL  12/05/2016   Fistula smaller/improving.  IR SINUS/FIST TUBE CHK-NON GI 12/05/2016 Simonne Come, MD [facility]   IR GENERIC HISTORICAL  12/22/2016   IR RADIOLOGIST EVAL & MGMT 12/22/2016 Berdine Dance, MD [facility]   IR RADIOLOGIST EVAL & MGMT  01/10/2017   IR RADIOLOGIST EVAL & MGMT  02/09/2017   IR SINUS/FIST TUBE CHK-NON GI  03/28/2017   LEFT HEART CATHETERIZATION WITH CORONARY ANGIOGRAM N/A 05/30/2012   Procedure: LEFT HEART CATHETERIZATION WITH CORONARY ANGIOGRAM;  Surgeon: Kathleene Hazel, MD;  Location: [facility] CATH LAB;  Service: Cardiovascular: Mild, non-obstructive CAD   TRANSTHORACIC ECHOCARDIOGRAM  09/14/2016; 11/2016   08/2016: EF 30-35 %, Akinesis of the mid-apicalanteroseptal myocardium.  Grade I DD.  Mild pulm HTN.  Repeat echo 11/2016, EF 40-45%, no rwma, nl RV fxn, mild TR, PASP .     TRANSTHORACIC ECHOCARDIOGRAM  03/07/2017   EF 50-55%. Hypokinesis of the distal septum with overall low normal LV,  systolic function; mild diastolic dysfunction with elevated LV  filling pressure; mildly calcified aortic valve with mild AI; small pericardial effusion   TRANSTHORACIC ECHOCARDIOGRAM     04/2022 EF 60-65%, no WMA, no valve probs   Zio patch  11/2022   3 day:  primarily sinus, avg HR 97, brief run SVT, rare PVC     A IV Location/Drains/Wounds Patient Lines/Drains/Airways Status     Active Line/Drains/Airways     Name Placement date Placement time Site Days    Peripheral IV 11/10/22 20 G 1" Right Antecubital 11/10/22  1619  Antecubital  108   Peripheral IV 03/13/2023 20 G Left Antecubital 03/08/2023  --  Antecubital  less than 1            Intake/Output Last 24 hours  Intake/Output Summary (Last 24 hours) at 03/17/2023 2212 Last data filed at 03/17/2023 2019 Gross per 24 hour  Intake 50 ml  Output --  Net 50 ml    Labs/Imaging Results for orders placed or performed during the hospital encounter of 03/20/2023 (from the past 48 hour(s))  CBC with Differential     Status: Abnormal   Collection Time: 03/19/2023  7:09 PM  Result Value Ref Range   WBC 8.6 4.0 - 10.5 K/uL   RBC 4.53 3.87 - 5.11 MIL/uL   Hemoglobin 12.1 12.0 - 15.0 g/dL   HCT 16.1 09.6 - 04.5 %   MCV 86.3 80.0 - 100.0 fL   MCH 26.7 26.0 - 34.0 pg   MCHC 30.9 30.0 - 36.0 g/dL   RDW 40.9 (H) 81.1 - 91.4 %   Platelets 282 150 - 400 K/uL   nRBC 0.0 0.0 - 0.2 %   Neutrophils Relative % 76 %   Neutro Abs 6.4 1.7 - 7.7 K/uL   Lymphocytes Relative 13 %   Lymphs Abs 1.1 0.7 - 4.0 K/uL   Monocytes Relative 11 %   Monocytes Absolute 1.0 0.1 - 1.0 K/uL   Eosinophils Relative 0 %   Eosinophils Absolute 0.0 0.0 - 0.5 K/uL   Basophils Relative 0 %   Basophils Absolute 0.0 0.0 - 0.1 K/uL   Immature Granulocytes 0 %   Abs Immature Granulocytes 0.02 0.00 - 0.07 K/uL    Comment: Performed at [facility], [address], [place], [place] [address]  Comprehensive metabolic panel     Status: Abnormal   Collection Time: 03/03/2023  7:09 PM  Result Value Ref Range   Sodium 126 (L) 135 - 145 mmol/L   Potassium 4.5 3.5 - 5.1 mmol/L   Chloride 91 (L) 98 - 111 mmol/L   CO2 23 22 - 32 mmol/L   Glucose, Bld 154 (H) 70 - 99 mg/dL    Comment: Glucose reference range applies only to samples taken after fasting for at least 8 hours.   BUN 10 8 - 23 mg/dL   Creatinine, Ser 6.21 (H) 0.44 - 1.00 mg/dL   Calcium 8.9 8.9 - 30.8 mg/dL   Total Protein 6.6 6.5 - 8.1  g/dL   Albumin 3.2 (L) 3.5 - 5.0  g/dL   AST 24 15 - 41 U/L   ALT 15 0 - 44 U/L   Alkaline Phosphatase 72 38 - 126 U/L   Total Bilirubin 0.2 (L) 0.3 - 1.2 mg/dL   GFR, Estimated 56 (L) >60 mL/min    Comment: (NOTE) Calculated using the CKD-EPI Creatinine Equation (2021)    Anion gap 12 5 - 15    Comment: Performed at [facility], [address] [place], [place] [address]  Brain natriuretic peptide     Status: Abnormal   Collection Time: 02/24/2023  7:09 PM  Result Value Ref Range   B Natriuretic Peptide 889.7 (H) 0.0 - 100.0 pg/mL    Comment: Performed at [facility], [address], [place], [place] [address]  Troponin I (High Sensitivity)     Status: Abnormal   Collection Time: 03/02/2023  7:09 PM  Result Value Ref Range   Troponin I (High Sensitivity) 19 (H) <18 ng/L    Comment: (NOTE) Elevated high sensitivity troponin I (hsTnI) values and significant  changes across serial measurements may suggest ACS but many other  chronic and acute conditions are known to elevate hsTnI results.  Refer to the "Links" section for chest pain algorithms and additional  guidance. Performed at [facility], [address], [place], [place] [address]   I-Stat venous blood gas, [facility], [facility], [facility])     Status: Abnormal   Collection Time: 03/17/2023  7:22 PM  Result Value Ref Range   pH, Ven 7.301 7.25 - 7.43   pCO2, Ven 54.1 44 - 60 mmHg   pO2, Ven 37 32 - 45 mmHg   Bicarbonate 26.7 20.0 - 28.0 mmol/L   TCO2 28 22 - 32 mmol/L   O2 Saturation 64 %   Acid-base deficit 1.0 0.0 - 2.0 mmol/L   Sodium 129 (L) 135 - 145 mmol/L   Potassium 4.6 3.5 - 5.1 mmol/L   Calcium, Ion 1.19 1.15 - 1.40 mmol/L   HCT 40.0 36.0 - 46.0 %   Hemoglobin 13.6 12.0 - 15.0 g/dL   Sample type VENOUS    Comment NOTIFIED PHYSICIAN   I-stat chem 8, ED (not at [facility], [facility] or [facility])     Status: Abnormal   Collection Time: 03/21/2023  7:23 PM  Result Value Ref Range   Sodium 129 (L) 135 - 145 mmol/L   Potassium 4.6 3.5 - 5.1 mmol/L   Chloride 91  (L) 98 - 111 mmol/L   BUN 11 8 - 23 mg/dL   Creatinine, Ser 6.21 0.44 - 1.00 mg/dL   Glucose, Bld 308 (H) 70 - 99 mg/dL    Comment: Glucose reference range applies only to samples taken after fasting for at least 8 hours.   Calcium, Ion 1.18 1.15 - 1.40 mmol/L   TCO2 27 22 - 32 mmol/L   Hemoglobin 13.9 12.0 - 15.0 g/dL   HCT 65.7 84.6 - 96.2 %  Resp panel by RT-PCR (RSV, Flu A&B, Covid) Anterior Nasal Swab     Status: None   Collection Time: 03/11/2023  7:24 PM   Specimen: Anterior Nasal Swab  Result Value Ref Range   SARS Coronavirus 2 by RT PCR NEGATIVE NEGATIVE   Influenza A by PCR NEGATIVE NEGATIVE   Influenza B by PCR NEGATIVE NEGATIVE    Comment: (NOTE) The Xpert Xpress SARS-CoV-2/FLU/RSV plus assay is intended as an aid in the diagnosis of influenza  from Nasopharyngeal swab specimens and should not be used as a sole basis for treatment. Nasal washings and aspirates are unacceptable for Xpert Xpress SARS-CoV-2/FLU/RSV testing.  Fact Sheet for Patients: BloggerCourse.com  Fact Sheet for Healthcare Providers: SeriousBroker.it  This test is not yet approved or cleared by the [place] FDA and has been authorized for detection and/or diagnosis of SARS-CoV-2 by FDA under an Emergency Use Authorization (EUA). This EUA will remain in effect (meaning this test can be used) for the duration of the COVID-19 declaration under Section 564(b)(1) of the Act, 21 U.S.C. section 360bbb-3(b)(1), unless the authorization is terminated or revoked.     Resp Syncytial Virus by PCR NEGATIVE NEGATIVE    Comment: (NOTE) Fact Sheet for Patients: BloggerCourse.com  Fact Sheet for Healthcare Providers: SeriousBroker.it  This test is not yet approved or cleared by the [place] FDA and has been authorized for detection and/or diagnosis of SARS-CoV-2 by FDA under an Emergency Use  Authorization (EUA). This EUA will remain in effect (meaning this test can be used) for the duration of the COVID-19 declaration under Section 564(b)(1) of the Act, 21 U.S.C. section 360bbb-3(b)(1), unless the authorization is terminated or revoked.  Performed at [facility], [address], [place], [place] [address]   CBG monitoring, ED     Status: Abnormal   Collection Time: 02/23/2023  9:42 PM  Result Value Ref Range   Glucose-Capillary 205 (H) 70 - 99 mg/dL    Comment: Glucose reference range applies only to samples taken after fasting for at least 8 hours.   DG Chest Port 1 View  Result Date:  CLINICAL DATA:  Shortness of breath. EXAM: PORTABLE CHEST 1 VIEW COMPARISON:  Chest radiograph dated 10/18/2022 and CT dated 11/10/2022. FINDINGS: Right perihilar and right upper lobe opacities as seen on the prior CT and corresponding to post radiation changes. No new consolidative changes. There is no pleural effusion or pneumothorax. Stable cardiac silhouette. No acute osseous pathology. IMPRESSION: 1. No acute cardiopulmonary process. 2. 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 Electronically Signed   By: Elgie Collard M.D.   On: 03/07/2023 19:40    Pending Labs Unresulted Labs (From admission, onward)    None       Vitals/Pain Today's Vitals   03/04/2023 1912 03/19/2023 1928 03/07/2023 2115 02/22/2023 2130  BP:   112/80 115/82  Pulse:   (!) 101 (!) 108  Resp:   20 (!) 24  Temp:      TempSrc:      SpO2: 99% 100% 98% 96%  Weight:      Height:      PainSc:        Isolation Precautions No active isolations  Medications Medications  insulin aspart (novoLOG) injection 0-15 Units (has no administration in time range)  insulin aspart (novoLOG) injection 0-5 Units (2 Units Subcutaneous Given  2146)  albuterol (PROVENTIL) (2.5 MG/3ML) 0.083% nebulizer solution (15 mg/hr Nebulization Given 03/24/2023 1927)  ipratropium (ATROVENT) nebulizer solution 1 mg (1 mg  Nebulization Given 03/23/2023 1928)  methylPREDNISolone sodium succinate (SOLU-MEDROL) 125 mg/2 mL injection 125 mg (125 mg Intravenous Given 03/09/2023 1920)  magnesium sulfate IVPB 2 g 50 mL (0 g Intravenous Stopped 03/11/2023 2019)  furosemide (LASIX) injection 40 mg (40 mg Intravenous Given 03/20/2023 2115)    Mobility walks with device     Focused Assessments Pulmonary Assessment Handoff:  Lung sounds: Bilateral Breath Sounds: Coarse crackles O2 Device: Nasal Cannula O2 Flow Rate (L/min): 4 L/min  R Recommendations: See Admitting Provider Note  Report given to:   Additional Notes:

## 2023-02-26 NOTE — ED Notes (Signed)
Patient daughter updated.

## 2023-02-26 NOTE — ED Provider Notes (Signed)
[place] EMERGENCY DEPARTMENT AT [facility] Provider Note   CSN: [id number] Arrival date & time: 03/15/2023  1903     History  Chief Complaint  Patient presents with   Shortness of Breath    Bridgetta Mcquillan is a 77 y.o. female history of diastolic heart failure, diabetes, COPD, here presenting with shortness of breath.  Patient has acute onset of shortness of breath today.  Patient apparently was in moderate respiratory distress and EMS was called.  Patient was put on CPAP and then vomited and was taken off CPAP.  Patient is not on oxygen at baseline.  Patient recently saw PCP and was noted to be coughing for about a month or so.  Patient is in moderate distress and difficult to get history due to respiratory distress.  The history is provided by the patient.       Home Medications Prior to Admission medications   Medication Sig Start Date End Date Taking? Authorizing Provider  acetaminophen (TYLENOL) 500 MG tablet Take 1,000 mg by mouth daily as needed for headache.    [provider]  albuterol (PROVENTIL) (2.5 MG/3ML) 0.083% nebulizer solution USE 1 VIAL IN NEBULIZER EVERY 4 HOURS AS NEEDED FOR WHEEZING AND FOR SHORTNESS OF BREATH 02/12/21   McGowen, Maryjean Morn, MD  albuterol (VENTOLIN HFA) 108 (90 Base) MCG/ACT inhaler INHALE 2 PUFFS BY MOUTH EVERY 6 HOURS AS NEEDED FOR WHEEZING OR SHORTNESS OF BREATH 06/05/20   McGowen, Maryjean Morn, MD  aspirin 81 MG EC tablet Take 81 mg by mouth daily.     [provider]  atorvastatin (LIPITOR) 40 MG tablet Take 1 tablet (40 mg total) by mouth daily. 11/18/22   McGowen, Maryjean Morn, MD  budesonide-formoterol (SYMBICORT) 160-4.5 MCG/ACT inhaler Inhale 2 puffs into the lungs 2 (two) times daily. 09/02/22   McGowen, Maryjean Morn, MD  buPROPion (WELLBUTRIN XL) 300 MG 24 hr tablet Take 1 tablet (300 mg total) by mouth daily. 11/18/22   McGowen, Maryjean Morn, MD  Calcium Carb-Cholecalciferol (CALCIUM + VITAMIN D3 PO) Take 1-2 tablets by  mouth See admin instructions. Take 1 tablet by mouth in the morning and 2 tablets by mouth at bedtime    [provider]  cetirizine (ZYRTEC) 10 MG tablet Take 10 mg by mouth every evening.    [provider]  ibandronate (BONIVA) 150 MG tablet Take 1 tablet (150 mg total) by mouth every 30 (thirty) days. Take in the morning with a full glass of water, on an empty stomach, and do not take anything else by mouth or lie down for the next 30 min. 08/04/22   McGowen, Maryjean Morn, MD  insulin isophane & regular human KwikPen (HUMULIN 70/30 KWIKPEN) (70-30) 100 UNIT/ML KwikPen 4 units SQ bid 02/17/23   McGowen, Maryjean Morn, MD  ketorolac (ACULAR) 0.5 % ophthalmic solution Place 1 drop into the left eye 4 (four) times daily. 11/01/22   [provider]  Multiple Vitamins-Minerals (MULTIVITAMIN WOMEN PO) Take 1 tablet by mouth daily.    [provider]  [facility] ULTRA test strip See admin instructions. 10/28/22   [provider]  pantoprazole (PROTONIX) 40 MG tablet Take 1 tablet (40 mg total) by mouth 2 (two) times daily. 01/10/23   McGowen, Maryjean Morn, MD  prednisoLONE acetate (PRED FORTE) 1 % ophthalmic suspension Place 1 drop into the left eye. 11/17/22   [provider]  Spacer/Aero-Holding Chambers (AEROCHAMBER MV) inhaler Use as instructed 08/04/22   McGowen, Maryjean Morn, MD  Allergies    Lactose intolerance (gi) and Motrin [ibuprofen]    Review of Systems   Review of Systems  Respiratory:  Positive for shortness of breath.   All other systems reviewed and are negative.   Physical Exam Updated Vital Signs BP 113/75 (BP Location: Right Arm)   Pulse (!) 113   Temp (!) 97.4 F (36.3 C) (Oral)   Resp 16   Ht 5' (1.524 m)   Wt 61 kg   SpO2 100%   BMI 26.26 kg/m  Physical Exam Vitals and nursing note reviewed.  Constitutional:      Comments: Talking in 2-3 word sentences.  Tachypneic and vomiting  HENT:     Head: Normocephalic.  Eyes:      Extraocular Movements: Extraocular movements intact.     Pupils: Pupils are equal, round, and reactive to light.  Cardiovascular:     Rate and Rhythm: Normal rate and regular rhythm.  Pulmonary:     Comments: Audible wheezing bilaterally.  Diminished bilateral bases. Abdominal:     General: Bowel sounds are normal.     Palpations: Abdomen is soft.  Musculoskeletal:     Cervical back: Normal range of motion and neck supple.     Comments: 1+ edema bilateral legs  Skin:    General: Skin is warm.     Capillary Refill: Capillary refill takes less than 2 seconds.  Neurological:     General: No focal deficit present.     Comments: Sleepy but arousable  Psychiatric:        Mood and Affect: Mood normal.        Behavior: Behavior normal.     ED Results / Procedures / Treatments   Labs (all labs ordered are listed, but only abnormal results are displayed) Labs Reviewed  CBC WITH DIFFERENTIAL/PLATELET - Abnormal; Notable for the following components:      Result Value   RDW 18.4 (*)    All other components within normal limits  COMPREHENSIVE METABOLIC PANEL - Abnormal; Notable for the following components:   Sodium 126 (*)    Chloride 91 (*)    Glucose, Bld 154 (*)    Creatinine, Ser 1.05 (*)    Albumin 3.2 (*)    Total Bilirubin 0.2 (*)    GFR, Estimated 56 (*)    All other components within normal limits  BRAIN NATRIURETIC PEPTIDE - Abnormal; Notable for the following components:   B Natriuretic Peptide 889.7 (*)    All other components within normal limits  I-STAT CHEM 8, ED - Abnormal; Notable for the following components:   Sodium 129 (*)    Chloride 91 (*)    Glucose, Bld 154 (*)    All other components within normal limits  I-STAT VENOUS BLOOD GAS, ED - Abnormal; Notable for the following components:   Sodium 129 (*)    All other components within normal limits  TROPONIN I (HIGH SENSITIVITY) - Abnormal; Notable for the following components:   Troponin I (High  Sensitivity) 19 (*)    All other components within normal limits  RESP PANEL BY RT-PCR (RSV, FLU A&B, COVID)  [id number]  TROPONIN I (HIGH SENSITIVITY)    EKG EKG Interpretation  Date/Time:  Sunday Feb 26 2023 19:11:56 EDT Ventricular Rate:  111 PR Interval:  129 QRS Duration: 135 QT Interval:  362 QTC Calculation: 492 R Axis:   91 Text Interpretation: Sinus or ectopic atrial tachycardia Multiple ventricular premature complexes Nonspecific intraventricular conduction delay Probable anteroseptal infarct,  recent Lateral leads are also involved No significant change since last tracing Confirmed by Richardean Canal ([id number]) on 03/14/2023 7:20:24 PM  Radiology DG Chest Port 1 View  Result Date: 03/16/2023 CLINICAL DATA:  Shortness of breath. EXAM: PORTABLE CHEST 1 VIEW COMPARISON:  Chest radiograph dated 10/18/2022 and CT dated 11/10/2022. FINDINGS: Right perihilar and right upper lobe opacities as seen on the prior CT and corresponding to post radiation changes. No new consolidative changes. There is no pleural effusion or pneumothorax. Stable cardiac silhouette. No acute osseous pathology. IMPRESSION: 1. No acute cardiopulmonary process. 2. 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 Electronically Signed   By: Elgie Collard M.D.   On: 03/22/2023 19:40    Procedures Procedures     CRITICAL CARE Performed by: Richardean Canal   Total critical care time: 36 minutes  Critical care time was exclusive of separately billable procedures and treating other patients.  Critical care was necessary to treat or prevent imminent or life-threatening deterioration.  Critical care was time spent personally by me on the following activities: development of treatment plan with patient and/or surrogate as well as nursing, discussions with consultants, evaluation of patient's response to treatment, examination of patient, obtaining history from patient or surrogate, ordering and performing treatments and  interventions, ordering and review of laboratory studies, ordering and review of radiographic studies, pulse oximetry and re-evaluation of patient's condition.  Medications Ordered in ED Medications  furosemide (LASIX) injection 40 mg (has no administration in time range)  albuterol (PROVENTIL) (2.5 MG/3ML) 0.083% nebulizer solution (15 mg/hr Nebulization Given 03/20/2023 1927)  ipratropium (ATROVENT) nebulizer solution 1 mg (1 mg Nebulization Given 03/22/2023 1928)  methylPREDNISolone sodium succinate (SOLU-MEDROL) 125 mg/2 mL injection 125 mg (125 mg Intravenous Given 02/24/2023 1920)  magnesium sulfate IVPB 2 g 50 mL (0 g Intravenous Stopped 03/20/2023 2019)    ED Course/ Medical Decision Making/ A&P                             Medical Decision Making Chyanne Paddock is a 77 y.o. female history of diastolic heart failure, COPD here presenting with shortness of breath and cough.  Patient has mild diffuse wheezing throughout.  Concern for COPD versus CHF.  Plan to get CBC and CMP and BNP and chest x-ray.  Will give Solu-Medrol and continuous nebs and magnesium.  9:14 PM I reviewed patient's labs and BNP is elevated at 889.  Chest x-ray showed no obvious pneumonia.  Patient's VBG is reassuring.  Patient was given Solu-Medrol and magnesium and finished nebulizer treatment and given Lasix.  At this point hospitalist to admit for hypoxia from COPD and CHF  Problems Addressed: Acute on chronic congestive heart failure, unspecified heart failure type [facility]): acute illness or injury Chronic obstructive pulmonary disease with acute exacerbation (HCC): acute illness or injury  Amount and/or Complexity of Data Reviewed Labs: ordered. Decision-making details documented in ED Course. Radiology: ordered and independent interpretation performed. Decision-making details documented in ED Course.  Risk Prescription drug management. Decision regarding hospitalization.    Final diagnoses:  None    Rx / DC  Orders ED Discharge Orders     None         Charlynne Pander, MD 03/15/2023 2115

## 2023-02-26 NOTE — ED Notes (Signed)
Daughter Eather Colas [phone] would like an update asap

## 2023-02-26 NOTE — H&P (Incomplete)
PCP:   Rachael Massed, MD   Chief Complaint:  Shortness of breath  HPI: This is a 76-y/o female w/ PMHx significant for Takotsubo syndrome, hypothyroidism, CHF, COPD, anxiety and depression, DM Type 2, dementia, HLD, HTN, h/o small cell lung cancer, and h/o CVA.  She has a chronic cough since her lung cancer treatment/radiation.  Per patient she was not feeling well yesterday she had nausea and vomiting yesterday and again today.  Her chronic cough has been increasing infrequency over the past 3 days.  The family increased her nebulizer treatment, given every 4 hours.  Patient was not wheezing at the time.  Today the patient woke up sounding worse, with rattling in the chest and some wheezing.  She was given nebulizer treatment at 10, 2 and 6.  She had emesis around 5 PM, her respirations worsened.  911 was activated, she was taken to the ER.  There is no report of chest pains or worsening edema.  Patient not maintained on Lasix or home oxygen.  When EMS arrived, patient in respiratory distress, she was placed on CPAP, she vomited, CPAP was removed.  She is on 2 L nasal cannula satting greater than 94%.  CXR clear.  BNP 889, Na 129.  In ER patient received Solu-Medrol 125 mg IV x 1, albuterol, Atrovent, IV mag.  RSV panel ordered.  Patient given IV Lasix 40 mg.  Admission requested.  History provided by patient's daughter via phone  Review of Systems:  Per HPI  Past Medical History: Past Medical History:  Diagnosis Date   Acute renal failure (HCC)    Age-related nuclear cataract of both eyes 2016   +cortical age related cataracts OU    Anxiety and depression    Bilateral diabetic retinopathy (HCC) 2015   Dr. 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   Colonic diverticular abscess    COPD (chronic  obstructive pulmonary disease) (HCC)    CVA (cerebral vascular accident) (HCC) 09/2016   MRI did show a left-sided small ischemic stroke which was acute but likely incidental (MRI was done b/c pt had TIA sx's in hospital).  Neuro put her on plavix at that time.  Cardiology d/c'd her plavix 08/2017.   Diabetes mellitus with complication (HCC)    diab retinopathy OU (laser)   Diverticulitis of large intestine with perforation and abscess 09/11/2016   Finger fracture, right 04/2020   R 5th metacarpal fx at the base (Dr. Dion Saucier)   GERD (gastroesophageal reflux disease)    protonix   History of concussion 08/25/2018   w/out loss of consciousness--mild increase in baseline memory impairment after.  CT head neg acute.   History of diverticulitis of colon    with abscess; required IR percutaneous drain placed 09/2016.   History of iron deficiency anemia    started iron 07/02/19.  EGD/colonoscopy 08/27/19 unrevealing. Capsule endoscopy considered but never done.   Hyperlipidemia    Hypertension    Left bundle branch block (LBBB) 12/16/2016   Lung cancer (HCC)    non-small cell lung ca, stage III in 05/2011; systemic chemotherapy concurrent with radiation followed by prophylactic cranial irradiation and has been observation since July of 2010 with no evidence for disease recurrence-released from onc f/u 12/2014 (needs annual cxr by PCP).  CXR 08/2015 stable.  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 Shirline Frees did bx and this was NEG for malignancy.   Mild cognitive impairment with memory loss    Likely from brain radiation therapy   Non-obstructive CAD (coronary artery disease)    a. Cath 2006 preserved LV fxn, scattered irregularities without critical stenosis; b. 2008 stress echo negative for ischemia, but with hypertensive response; c. 08/2016 Cath: D1 25%, otw nl.   Normocytic anemia 2018   Iron, vit B12, folate all normal 12/2016.   Open-angle glaucoma 2016   Dr. Hanley Seamen, (bilateral)---responding to  topical therapy   Osteoarthritis of both hands    Osteoporosis 03/2016; 03/2018   03/2016 DEXA T-score -2.2.  03/2018 T-score L femoral neck  -2.7--boniva started. DEXA 04/2021 T scor -2.6->boniva continued. Plan rpt DEXA 2 yrs.   Pneumonia    hospitalization 11/2016; hypoxemic resp failure--d/c'd home with home oxygen therapy   Pulmonary nodule, left 08/2021   LUL 11 mm ground glass, needs f/u CT 08/2022.   Seasonal allergic rhinitis    Subclinical hyperthyroidism 2018   Takotsubo cardiomyopathy    a. 08/2016 Echo: EF 30-35%, gr1DD, PASP ;  b. 08/2016 Cath: nonobs Dzs;  c. 11/2016 Echo: EF 40-45%, no rwma, nl RV fxn, mild TR, PASP .  ECHO 02/2017 EF 50-55%, grd II DD---essentially resolved Takotsubo 02/2017.   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 08/2016 in setting of diverticulitis & pneumoperitoneum and Takotsubo CM -->prolonged QT, seen by EP-->avoid meds with potential for QT prolongation.   Past Surgical History:  Procedure Laterality Date   ABDOMINAL HYSTERECTOMY  1997   APPENDECTOMY     CARDIAC CATHETERIZATION N/A 09/14/2016   Procedure: Left Heart Cath and Coronary Angiography;  Surgeon: Lennette Bihari, MD;  Location: [facility] INVASIVE CV LAB;  Service: Cardiovascular; Mild nonobstructive CAD with 85% smooth narrowing in the first diagonal branch of the LAD; 10% smooth narrowing of the ostial proximal left circumflex coronary artery; and a normal dominant RCA.   CARPAL TUNNEL RELEASE     CATARACT EXTRACTION Bilateral    CHOLECYSTECTOMY  2002   COLONOSCOPY  10/24/00; 08/27/19   2002 normal.  [employer] hemoccult testing via [facility] 06/18/15 was NEG.  2020->diverticulosis o/w nl.   dexa  03/2016; 03/2018   03/2018 T-score L femoral neck  -2.7 (worsened compared to osteopenic range 2017.) DEXA 05/03/21 stable T score -2.6 on boniva x 2 yrs->plan rpt DEXA 2 yrs.   ESOPHAGOGASTRODUODENOSCOPY  08/27/2019   Normal.   IR GENERIC HISTORICAL  09/19/2016   IR SINUS/FIST TUBE CHK-NON GI 09/19/2016  Gilmer Mor, DO [facility]   IR GENERIC HISTORICAL  10/06/2016   IR RADIOLOGIST EVAL & MGMT 10/06/2016 [facility]   IR GENERIC HISTORICAL  10/26/2016   IR RADIOLOGIST EVAL & MGMT 10/26/2016 [facility]   IR GENERIC HISTORICAL  11/03/2016   IR RADIOLOGIST EVAL & MGMT 11/03/2016 Gershon Crane, PA-C [facility]   IR GENERIC HISTORICAL  11/24/2016   IR RADIOLOGIST EVAL & MGMT 11/24/2016 Gershon Crane, PA-C [facility]   IR GENERIC HISTORICAL  12/05/2016   Fistula smaller/improving.  IR SINUS/FIST TUBE CHK-NON GI 12/05/2016 Simonne Come, MD [facility]   IR GENERIC HISTORICAL  12/22/2016   IR RADIOLOGIST EVAL & MGMT 12/22/2016 Berdine Dance, MD [facility]   IR RADIOLOGIST EVAL & MGMT  01/10/2017   IR RADIOLOGIST EVAL & MGMT  02/09/2017   IR SINUS/FIST TUBE CHK-NON GI  03/28/2017   LEFT  HEART CATHETERIZATION WITH CORONARY ANGIOGRAM N/A 05/30/2012   Procedure: LEFT HEART CATHETERIZATION WITH CORONARY ANGIOGRAM;  Surgeon: Kathleene Hazel, MD;  Location: [facility] CATH LAB;  Service: Cardiovascular: Mild, non-obstructive CAD   TRANSTHORACIC ECHOCARDIOGRAM  09/14/2016; 11/2016   08/2016: EF 30-35 %, Akinesis of the mid-apicalanteroseptal myocardium.  Grade I DD.  Mild pulm HTN.  Repeat echo 11/2016, EF 40-45%, no rwma, nl RV fxn, mild TR, PASP .     TRANSTHORACIC ECHOCARDIOGRAM  03/07/2017   EF 50-55%. Hypokinesis of the distal septum with overall low normal LV,  systolic function; mild diastolic dysfunction with elevated LV  filling pressure; mildly calcified aortic valve with mild AI; small pericardial effusion   TRANSTHORACIC ECHOCARDIOGRAM     04/2022 EF 60-65%, no WMA, no valve probs   Zio patch  11/2022   3 day:  primarily sinus, avg HR 97, brief run SVT, rare PVC    Medications: Prior to Admission medications   Medication Sig Start Date End Date Taking? Authorizing Provider  acetaminophen (TYLENOL) 500 MG tablet Take 1,000 mg by mouth daily as  needed for headache.    [provider]  albuterol (PROVENTIL) (2.5 MG/3ML) 0.083% nebulizer solution USE 1 VIAL IN NEBULIZER EVERY 4 HOURS AS NEEDED FOR WHEEZING AND FOR SHORTNESS OF BREATH 02/12/21   McGowen, Maryjean Morn, MD  albuterol (VENTOLIN HFA) 108 (90 Base) MCG/ACT inhaler INHALE 2 PUFFS BY MOUTH EVERY 6 HOURS AS NEEDED FOR WHEEZING OR SHORTNESS OF BREATH 06/05/20   McGowen, Maryjean Morn, MD  aspirin 81 MG EC tablet Take 81 mg by mouth daily.     [provider]  atorvastatin (LIPITOR) 40 MG tablet Take 1 tablet (40 mg total) by mouth daily. 11/18/22   McGowen, Maryjean Morn, MD  budesonide-formoterol (SYMBICORT) 160-4.5 MCG/ACT inhaler Inhale 2 puffs into the lungs 2 (two) times daily. 09/02/22   McGowen, Maryjean Morn, MD  buPROPion (WELLBUTRIN XL) 300 MG 24 hr tablet Take 1 tablet (300 mg total) by mouth daily. 11/18/22   McGowen, Maryjean Morn, MD  Calcium Carb-Cholecalciferol (CALCIUM + VITAMIN D3 PO) Take 1-2 tablets by mouth See admin instructions. Take 1 tablet by mouth in the morning and 2 tablets by mouth at bedtime    [provider]  cetirizine (ZYRTEC) 10 MG tablet Take 10 mg by mouth every evening.    [provider]  ibandronate (BONIVA) 150 MG tablet Take 1 tablet (150 mg total) by mouth every 30 (thirty) days. Take in the morning with a full glass of water, on an empty stomach, and do not take anything else by mouth or lie down for the next 30 min. 08/04/22   McGowen, Maryjean Morn, MD  insulin isophane & regular human KwikPen (HUMULIN 70/30 KWIKPEN) (70-30) 100 UNIT/ML KwikPen 4 units SQ bid 02/17/23   McGowen, Maryjean Morn, MD  ketorolac (ACULAR) 0.5 % ophthalmic solution Place 1 drop into the left eye 4 (four) times daily. 11/01/22   [provider]  Multiple Vitamins-Minerals (MULTIVITAMIN WOMEN PO) Take 1 tablet by mouth daily.    [provider]  [facility] ULTRA test strip See admin instructions. 10/28/22   [provider]  pantoprazole (PROTONIX)  40 MG tablet Take 1 tablet (40 mg total) by mouth 2 (two) times daily. 01/10/23   McGowen, Maryjean Morn, MD  prednisoLONE acetate (PRED FORTE) 1 % ophthalmic suspension Place 1 drop into the left eye. 11/17/22   [provider]  Spacer/Aero-Holding Chambers (AEROCHAMBER MV) inhaler Use as instructed 08/04/22  McGowen, Maryjean Morn, MD    Allergies:   Allergies  Allergen Reactions   Lactose Intolerance (Gi) Diarrhea   Motrin [Ibuprofen] Itching, Anxiety and Other (See Comments)    Anxiousness Hyperventilates     Social History:  reports that she quit smoking about 14 years ago. Her smoking use included cigarettes. She has a 45.00 pack-year smoking history. She has never used smokeless tobacco. She reports that she does not drink alcohol and does not use drugs.  Family History: Family History  Problem Relation Age of Onset   Heart failure Mother    Kidney disease Mother        renal failure   Diabetes Mother    Diabetes Father    Diabetes Brother    Heart disease Brother    Heart attack Brother    Heart attack Brother    Heart attack Brother    Pancreatic cancer Brother    Colon cancer Neg Hx    Esophageal cancer Neg Hx    Stomach cancer Neg Hx    Rectal cancer Neg Hx     Physical Exam: Vitals:   03/20/2023 1910 03/09/2023 1912 03/03/2023 1928  BP: 113/75    Pulse: (!) 113    Resp: 16    Temp: (!) 97.4 F (36.3 C)    TempSrc: Oral    SpO2: 99% 99% 100%  Weight: 61 kg    Height: 5' (1.524 m)      General:  Alert and oriented times three, well developed and nourished, no acute distress Eyes: Pink conjunctiva, no scleral icterus ENT: Moist oral mucosa, neck supple, no thyromegaly Lungs: course throughout, mild wheeze, no use of accessory muscles Cardiovascular: tachycardic, regular rate and rhythm, no regurgitation, no gallops, no murmurs. No carotid bruits, no JVD Abdomen: soft, positive BS, non-tender, non-distended, no organomegaly, not an acute abdomen GU: not  examined Neuro: CN II - XII grossly intact, Musculoskeletal: strength 5/5 all extremities. Legs look taut, fluid over loaded but no pitting Skin: no rash, no subcutaneous crepitation, no decubitus Psych: Mentally slow question dementia, mild MR   Labs on Admission:  Recent Labs    03/19/2023 1909 03/05/2023 1922 03/05/2023 1923  NA 126* 129* 129*  K 4.5 4.6 4.6  CL 91*  --  91*  CO2 23  --   --   GLUCOSE 154*  --  154*  BUN 10  --  11  CREATININE 1.05*  --  0.90  CALCIUM 8.9  --   --    Recent Labs    03/03/2023 1909  AST 24  ALT 15  ALKPHOS 72  BILITOT 0.2*  PROT 6.6  ALBUMIN 3.2*    Recent Labs    02/25/2023 1909 03/22/2023 1922 03/03/2023 1923  WBC 8.6  --   --   NEUTROABS 6.4  --   --   HGB 12.1 13.6 13.9  HCT 39.1 40.0 41.0  MCV 86.3  --   --   PLT 282  --   --     Micro Results: Recent Results (from the past 240 hour(s))  Resp panel by RT-PCR (RSV, Flu A&B, Covid) Anterior Nasal Swab     Status: None   Collection Time: 03/07/2023  7:24 PM   Specimen: Anterior Nasal Swab  Result Value Ref Range Status   SARS Coronavirus 2 by RT PCR NEGATIVE NEGATIVE Final   Influenza A by PCR NEGATIVE NEGATIVE Final   Influenza B by PCR NEGATIVE NEGATIVE Final  Comment: (NOTE) The Xpert Xpress SARS-CoV-2/FLU/RSV plus assay is intended as an aid in the diagnosis of influenza from Nasopharyngeal swab specimens and should not be used as a sole basis for treatment. Nasal washings and aspirates are unacceptable for Xpert Xpress SARS-CoV-2/FLU/RSV testing.  Fact Sheet for Patients: BloggerCourse.com  Fact Sheet for Healthcare Providers: SeriousBroker.it  This test is not yet approved or cleared by the [place] FDA and has been authorized for detection and/or diagnosis of SARS-CoV-2 by FDA under an Emergency Use Authorization (EUA). This EUA will remain in effect (meaning this test can be used) for the duration of  the COVID-19 declaration under Section 564(b)(1) of the Act, 21 U.S.C. section 360bbb-3(b)(1), unless the authorization is terminated or revoked.     Resp Syncytial Virus by PCR NEGATIVE NEGATIVE Final    Comment: (NOTE) Fact Sheet for Patients: BloggerCourse.com  Fact Sheet for Healthcare Providers: SeriousBroker.it  This test is not yet approved or cleared by the [place] FDA and has been authorized for detection and/or diagnosis of SARS-CoV-2 by FDA under an Emergency Use Authorization (EUA). This EUA will remain in effect (meaning this test can be used) for the duration of the COVID-19 declaration under Section 564(b)(1) of the Act, 21 U.S.C. section 360bbb-3(b)(1), unless the authorization is terminated or revoked.  Performed at [facility], [address] [place], [place] [address]      Radiological Exams on Admission: DG Chest Port 1 View  Result Date: 03/07/2023 CLINICAL DATA:  Shortness of breath. EXAM: PORTABLE CHEST 1 VIEW COMPARISON:  Chest radiograph dated 10/18/2022 and CT dated 11/10/2022. FINDINGS: Right perihilar and right upper lobe opacities as seen on the prior CT and corresponding to post radiation changes. No new consolidative changes. There is no pleural effusion or pneumothorax. Stable cardiac silhouette. No acute osseous pathology. IMPRESSION: 1. No acute cardiopulmonary process. 2. 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 Electronically Signed   By: Elgie Collard M.D.   On: 03/13/2023 19:40    CT Chest w/ contrast IMPRESSION: 1. New moderate right and small left layering pleural effusions, interval increase in chronic pericardial effusion also. No findings of acute pulmonary edema. 2. Small increased right lower lobe anterior infrahilar consolidation which could be atelectasis or a small pneumonia. 3. Small new areas of ground-glass disease in the apex of the left upper lobe which are  probably inflammatory/infectious. 4. Diffusely thickened bronchi. 5. Slightly prominent single left mid hilar lymph node is likely reactive. 6. 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 7. Aortic and coronary artery atherosclerosis. 8. Prominent left ventricular cavity, which may be seen with cardiomyopathy and prior ischemic disease. 9. Chronic soft tissue thickening in the subcarinal space and along the right mid to upper hilum which were seen previously and also compatible with XRT.   Assessment/Plan Present on Admission:  Acute respiratory failure with hypoxia -DDx: Acute diastolic heart failure, COPD exacerbation, aspiration pneumonia, pleural effusion -Likely aspiration pneumonia in light of patient's nausea vomiting then respiratory distress. -Scheduled and as needed nebulizers ordered -IV Unasyn -CT chest ordered -Continue oxygen to keep sats above 88% -Respiratory consult placed -N.p.o., speech eval in a.m.   Acute COPD exacerbation -Patient given IV Solu-Medrol in ER, will continue -Scheduled and as needed nebulizers -Oxygen to keep sats greater than 88%   Moderate right Pleural effusion -IR guided thoracentesis - defer to AM to discuss w/ IR safety issues. -Note: patient w/ h/o lung radiation w/ scarring/fibrosis  Pericardial effusion -Chronic w/ interval  increase in size -2D Echo -Cardiology consult   History of Takotsubo syndrome/ diastolic CHF -Legs look taut, fluid over loaded but no pitting. Patient w/ pleural and pericardial efusion -BNP elevated at 888.  Fluid restrict -Patient may benefit from being maintained on PO lasix -Lasix 40mg  PO daily initiated. Given 40mg  IV in the ER -Continue to monitor   Hyponatremia -TSH, urine sodium, urine osmolality -Monitor -BMP in a.m.   Nausea and vomiting -Resolved, will order.  Zofran PRN   Dementia w/o behavioral disturbance (HCC)/ ?mild MR/ depression -Wellbutrin resumed    Hyperlipidemia -Lipitor resumed   Type 2 diabetes mellitus (HCC) -Sliding scale insulin, 7030 resumed  [name] 03/05/2023, 9:13 PM

## 2023-02-27 ENCOUNTER — Inpatient Hospital Stay (HOSPITAL_COMMUNITY): Payer: Medicare Other

## 2023-02-27 DIAGNOSIS — I5181 Takotsubo syndrome: Secondary | ICD-10-CM | POA: Diagnosis not present

## 2023-02-27 DIAGNOSIS — E1169 Type 2 diabetes mellitus with other specified complication: Secondary | ICD-10-CM

## 2023-02-27 DIAGNOSIS — I10 Essential (primary) hypertension: Secondary | ICD-10-CM | POA: Diagnosis not present

## 2023-02-27 DIAGNOSIS — E871 Hypo-osmolality and hyponatremia: Secondary | ICD-10-CM | POA: Diagnosis not present

## 2023-02-27 DIAGNOSIS — I5033 Acute on chronic diastolic (congestive) heart failure: Secondary | ICD-10-CM | POA: Diagnosis not present

## 2023-02-27 DIAGNOSIS — J441 Chronic obstructive pulmonary disease with (acute) exacerbation: Secondary | ICD-10-CM | POA: Diagnosis not present

## 2023-02-27 DIAGNOSIS — E785 Hyperlipidemia, unspecified: Secondary | ICD-10-CM

## 2023-02-27 DIAGNOSIS — F0390 Unspecified dementia without behavioral disturbance: Secondary | ICD-10-CM

## 2023-02-27 LAB — ECHOCARDIOGRAM COMPLETE
AR max vel: 1.95 cm2
AV Area VTI: 1.75 cm2
AV Area mean vel: 1.7 cm2
AV Mean grad: 14 mmHg
AV Peak grad: 22.3 mmHg
Ao pk vel: 2.36 m/s
Area-P 1/2: 5.62 cm2
Est EF: 20
Height: 60 in
MV M vel: 4.41 m/s
MV Peak grad: 77.8 mmHg
MV VTI: 2.54 cm2
Radius: 0.25 cm
S' Lateral: 4.5 cm
Single Plane A4C EF: 23.3 %
Weight: 2151.69 oz

## 2023-02-27 LAB — CBC WITH DIFFERENTIAL/PLATELET
Abs Immature Granulocytes: 0.04 10*3/uL (ref 0.00–0.07)
Basophils Absolute: 0 10*3/uL (ref 0.0–0.1)
Basophils Relative: 0 %
Eosinophils Absolute: 0 10*3/uL (ref 0.0–0.5)
Eosinophils Relative: 0 %
HCT: 35.3 % — ABNORMAL LOW (ref 36.0–46.0)
Hemoglobin: 11.2 g/dL — ABNORMAL LOW (ref 12.0–15.0)
Immature Granulocytes: 1 %
Lymphocytes Relative: 4 %
Lymphs Abs: 0.3 10*3/uL — ABNORMAL LOW (ref 0.7–4.0)
MCH: 26.7 pg (ref 26.0–34.0)
MCHC: 31.7 g/dL (ref 30.0–36.0)
MCV: 84 fL (ref 80.0–100.0)
Monocytes Absolute: 0.1 10*3/uL (ref 0.1–1.0)
Monocytes Relative: 1 %
Neutro Abs: 6.9 10*3/uL (ref 1.7–7.7)
Neutrophils Relative %: 94 %
Platelets: 199 10*3/uL (ref 150–400)
RBC: 4.2 MIL/uL (ref 3.87–5.11)
RDW: 18.2 % — ABNORMAL HIGH (ref 11.5–15.5)
WBC: 7.3 10*3/uL (ref 4.0–10.5)
nRBC: 0 % (ref 0.0–0.2)

## 2023-02-27 LAB — BASIC METABOLIC PANEL
Anion gap: 11 (ref 5–15)
BUN: 9 mg/dL (ref 8–23)
CO2: 23 mmol/L (ref 22–32)
Calcium: 8.2 mg/dL — ABNORMAL LOW (ref 8.9–10.3)
Chloride: 92 mmol/L — ABNORMAL LOW (ref 98–111)
Creatinine, Ser: 1.07 mg/dL — ABNORMAL HIGH (ref 0.44–1.00)
GFR, Estimated: 55 mL/min — ABNORMAL LOW (ref >60)
Glucose, Bld: 260 mg/dL — ABNORMAL HIGH (ref 70–99)
Potassium: 4 mmol/L (ref 3.5–5.1)
Sodium: 126 mmol/L — ABNORMAL LOW (ref 135–145)

## 2023-02-27 LAB — CULTURE, BLOOD (ROUTINE X 2)

## 2023-02-27 LAB — CBG MONITORING, ED
Glucose-Capillary: 179 mg/dL — ABNORMAL HIGH (ref 70–99)
Glucose-Capillary: 198 mg/dL — ABNORMAL HIGH (ref 70–99)
Glucose-Capillary: 214 mg/dL — ABNORMAL HIGH (ref 70–99)

## 2023-02-27 LAB — GLUCOSE, CAPILLARY
Glucose-Capillary: 208 mg/dL — ABNORMAL HIGH (ref 70–99)
Glucose-Capillary: 164 mg/dL — ABNORMAL HIGH (ref 70–99)
Glucose-Capillary: 103 mg/dL — ABNORMAL HIGH (ref 70–99)

## 2023-02-27 LAB — MAGNESIUM: Magnesium: 2.2 mg/dL (ref 1.7–2.4)

## 2023-02-27 MED ORDER — PREDNISONE 20 MG PO TABS: 40.0000 mg | ORAL_TABLET | Freq: Every day | ORAL | Status: DC

## 2023-02-27 MED ORDER — PREDNISONE 20 MG PO TABS
40.0000 mg | ORAL_TABLET | Freq: Every day | ORAL | Status: DC
  Administered 2023-02-28: 40 mg via ORAL
  Filled 2023-02-27: qty 2

## 2023-02-27 MED ORDER — SODIUM CHLORIDE 0.9 % IV SOLN
3.0000 g | Freq: Four times a day (QID) | INTRAVENOUS | Status: DC
  Administered 2023-02-27 – 2023-03-04 (×21): 3 g via INTRAVENOUS
  Filled 2023-02-27 (×21): qty 8

## 2023-02-27 MED ORDER — ACETAMINOPHEN 325 MG PO TABS
650.0000 mg | ORAL_TABLET | Freq: Four times a day (QID) | ORAL | Status: DC | PRN
  Administered 2023-03-01 – 2023-03-03 (×2): 650 mg via ORAL
  Filled 2023-02-27: qty 2

## 2023-02-27 MED ORDER — METHYLPREDNISOLONE SODIUM SUCC 40 MG IJ SOLR
40.0000 mg | Freq: Every day | INTRAMUSCULAR | Status: AC
  Administered 2023-02-27: 40 mg via INTRAVENOUS
  Filled 2023-02-27: qty 1

## 2023-02-27 MED ORDER — ARFORMOTEROL TARTRATE 15 MCG/2ML IN NEBU
15.0000 ug | INHALATION_SOLUTION | Freq: Two times a day (BID) | RESPIRATORY_TRACT | Status: DC
  Administered 2023-02-27 – 2023-03-05 (×14): 15 ug via RESPIRATORY_TRACT
  Filled 2023-02-27 (×14): qty 2

## 2023-02-27 MED ORDER — ONDANSETRON HCL 4 MG PO TABS
4.0000 mg | ORAL_TABLET | Freq: Four times a day (QID) | ORAL | Status: DC | PRN
  Administered 2023-03-03: 4 mg via ORAL
  Filled 2023-02-27: qty 1

## 2023-02-27 MED ORDER — INSULIN ASPART PROT & ASPART (70-30 MIX) 100 UNIT/ML ~~LOC~~ SUSP
5.0000 [IU] | Freq: Two times a day (BID) | SUBCUTANEOUS | Status: DC
  Administered 2023-02-27 – 2023-02-28 (×3): 5 [IU] via SUBCUTANEOUS
  Filled 2023-02-27 (×2): qty 10

## 2023-02-27 MED ORDER — FUROSEMIDE 40 MG PO TABS
40.0000 mg | ORAL_TABLET | Freq: Every day | ORAL | Status: DC
  Administered 2023-02-28 – 2023-03-02 (×3): 40 mg via ORAL
  Filled 2023-02-27: qty 2
  Filled 2023-02-27 (×3): qty 1

## 2023-02-27 MED ORDER — ACETAMINOPHEN 650 MG RE SUPP: 650.0000 mg | Freq: Four times a day (QID) | RECTAL | Status: DC | PRN

## 2023-02-27 MED ORDER — ENOXAPARIN SODIUM 40 MG/0.4ML IJ SOSY
40.0000 mg | PREFILLED_SYRINGE | Freq: Every day | INTRAMUSCULAR | Status: DC
  Administered 2023-02-27 – 2023-03-04 (×6): 40 mg via SUBCUTANEOUS
  Filled 2023-02-27 (×6): qty 0.4

## 2023-02-27 MED ORDER — METHYLPREDNISOLONE SODIUM SUCC 125 MG IJ SOLR: 80.0000 mg | Freq: Every day | INTRAMUSCULAR | Status: DC

## 2023-02-27 MED ORDER — ONDANSETRON HCL 4 MG/2ML IJ SOLN: 4.0000 mg | Freq: Four times a day (QID) | INTRAMUSCULAR | Status: DC | PRN

## 2023-02-27 MED ORDER — INSULIN GLARGINE-YFGN 100 UNIT/ML ~~LOC~~ SOLN: 20.0000 [IU] | Freq: Every day | SUBCUTANEOUS | Status: DC

## 2023-02-27 MED ORDER — IOHEXOL 350 MG/ML SOLN
50.0000 mL | Freq: Once | INTRAVENOUS | Status: AC | PRN
  Administered 2023-02-27: 50 mL via INTRAVENOUS

## 2023-02-27 MED ORDER — IPRATROPIUM-ALBUTEROL 0.5-2.5 (3) MG/3ML IN SOLN
3.0000 mL | Freq: Four times a day (QID) | RESPIRATORY_TRACT | Status: DC
  Administered 2023-02-27 – 2023-03-05 (×21): 3 mL via RESPIRATORY_TRACT
  Filled 2023-02-27 (×22): qty 3

## 2023-02-27 MED ORDER — PANTOPRAZOLE SODIUM 40 MG IV SOLR
40.0000 mg | Freq: Two times a day (BID) | INTRAVENOUS | Status: DC
  Administered 2023-02-27 – 2023-03-01 (×6): 40 mg via INTRAVENOUS
  Filled 2023-02-27 (×6): qty 10

## 2023-02-27 MED ORDER — GUAIFENESIN-DM 100-10 MG/5ML PO SYRP
5.0000 mL | ORAL_SOLUTION | ORAL | Status: DC | PRN
  Administered 2023-02-27 – 2023-02-28 (×3): 5 mL via ORAL
  Filled 2023-02-27 (×3): qty 10

## 2023-02-27 MED ORDER — INSULIN ASPART 100 UNIT/ML IJ SOLN
0.0000 [IU] | INTRAMUSCULAR | Status: DC
  Administered 2023-02-27 (×2): 3 [IU] via SUBCUTANEOUS
  Administered 2023-02-27: 5 [IU] via SUBCUTANEOUS
  Administered 2023-02-27: 3 [IU] via SUBCUTANEOUS
  Administered 2023-02-27 – 2023-02-28 (×2): 5 [IU] via SUBCUTANEOUS
  Administered 2023-02-28: 3 [IU] via SUBCUTANEOUS
  Administered 2023-02-28: 2 [IU] via SUBCUTANEOUS
  Administered 2023-03-01 (×2): 3 [IU] via SUBCUTANEOUS
  Administered 2023-03-01 (×2): 2 [IU] via SUBCUTANEOUS
  Administered 2023-03-02 (×2): 3 [IU] via SUBCUTANEOUS
  Administered 2023-03-02: 2 [IU] via SUBCUTANEOUS
  Administered 2023-03-02 – 2023-03-03 (×4): 3 [IU] via SUBCUTANEOUS
  Administered 2023-03-04: 2 [IU] via SUBCUTANEOUS
  Administered 2023-03-04: 3 [IU] via SUBCUTANEOUS
  Administered 2023-03-04 – 2023-03-05 (×2): 2 [IU] via SUBCUTANEOUS

## 2023-02-27 MED ORDER — SENNOSIDES-DOCUSATE SODIUM 8.6-50 MG PO TABS: 1.0000 | ORAL_TABLET | Freq: Every evening | ORAL | Status: DC | PRN

## 2023-02-27 NOTE — Assessment & Plan Note (Addendum)
Acute on chronic systolic heart failure.   Echocardiogram with reduced LV systolic function to 20%, no LVH, RV systolic function is preserved, small pericardial effusion, moderate MR, mild aortic stenosis.   At the time of my examination no signs of volume overload. Continue blood pressure monitoring Hold on IV furosemide.

## 2023-02-27 NOTE — Assessment & Plan Note (Addendum)
Acute hypoxemic respiratory failure. Right lower lobe pneumonia.  Improved dyspnea, her oxygenation today is 98% on 2 L/min per [place] Plan to continue antibiotic therapy with Unasyn for possible aspiration.  Continue with bronchodilator therapy, and inhaled corticosteroids.  Out of bed to chair tid with meals. Airway clearing techniques with flutter valve and incentive spirometer.

## 2023-02-27 NOTE — ED Notes (Signed)
ED TO INPATIENT HANDOFF REPORT  ED Nurse Name and Phone #: Charmaine Downs  S Name/Age/Gender [name] 77 y.o. female Room/Bed: 002C/002C  Code Status   Code Status: Full Code  Home/SNF/Other Skilled nursing facility Patient oriented to: self, place, and time Is this baseline? Yes   Triage Complete: Triage complete  Chief Complaint Acute respiratory failure with hypoxemia (HCC) [J96.01]  Triage Note Patient arrives via ems from home secondary to sob, patient was placed  on cpap and patient started vomiting. Hx chf and lung cancer, 4 mg zofran given prior arrival.    Allergies Allergies  Allergen Reactions   Lactose Intolerance (Gi) Diarrhea   Motrin [Ibuprofen] Itching, Anxiety and Other (See Comments)    Hyperventilates     Level of Care/Admitting Diagnosis ED Disposition     ED Disposition  Admit   Condition  --   Comment  Hospital Area: [facility] [[id number]]  Level of Care: Telemetry Medical [[id number]]  May admit patient to [facility] or [facility] if equivalent level of care is available:: No  Covid Evaluation: Confirmed COVID Negative  Diagnosis: Acute respiratory failure with hypoxemia [facility]) [[id number]]  Admitting Physician: Gery Pray [[id number]]  Attending Physician: Gery Pray [[id number]]  Certification:: I certify this patient will need inpatient services for at least 2 midnights  Estimated Length of Stay: 2          B Medical/Surgery History Past Medical History:  Diagnosis Date   Acute renal failure (HCC)    Age-related nuclear cataract of both eyes 2016   +cortical age related cataracts OU    Anxiety and depression    Bilateral diabetic retinopathy (HCC) 2015   Dr. 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   Colonic diverticular abscess     COPD (chronic obstructive pulmonary disease) (HCC)    CVA (cerebral vascular accident) (HCC) 09/2016   MRI did show a left-sided small ischemic stroke which was acute but likely incidental (MRI was done b/c pt had TIA sx's in hospital).  Neuro put her on plavix at that time.  Cardiology d/c'd her plavix 08/2017.   Diabetes mellitus with complication (HCC)    diab retinopathy OU (laser)   Diverticulitis of large intestine with perforation and abscess 09/11/2016   Finger fracture, right 04/2020   R 5th metacarpal fx at the base (Dr. Dion Saucier)   GERD (gastroesophageal reflux disease)    protonix   History of concussion 08/25/2018   w/out loss of consciousness--mild increase in baseline memory impairment after.  CT head neg acute.   History of diverticulitis of colon    with abscess; required IR percutaneous drain placed 09/2016.   History of iron deficiency anemia    started iron 07/02/19.  EGD/colonoscopy 08/27/19 unrevealing. Capsule endoscopy considered but never done.   Hyperlipidemia    Hypertension    Left bundle branch block (LBBB) 12/16/2016   Lung cancer (HCC)    non-small cell lung ca, stage III in 05/2011; systemic chemotherapy concurrent with radiation followed by prophylactic cranial irradiation and has been observation since July of 2010 with no evidence for disease recurrence-released from onc f/u 12/2014 (needs annual cxr by PCP).  CXR 08/2015 stable.  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 Shirline Frees did bx and this was NEG for malignancy.  Mild cognitive impairment with memory loss    Likely from brain radiation therapy   Non-obstructive CAD (coronary artery disease)    a. Cath 2006 preserved LV fxn, scattered irregularities without critical stenosis; b. 2008 stress echo negative for ischemia, but with hypertensive response; c. 08/2016 Cath: D1 25%, otw nl.   Normocytic anemia 2018   Iron, vit B12, folate all normal 12/2016.   Open-angle glaucoma 2016   Dr. Hanley Seamen,  (bilateral)---responding to topical therapy   Osteoarthritis of both hands    Osteoporosis 03/2016; 03/2018   03/2016 DEXA T-score -2.2.  03/2018 T-score L femoral neck  -2.7--boniva started. DEXA 04/2021 T scor -2.6->boniva continued. Plan rpt DEXA 2 yrs.   Pneumonia    hospitalization 11/2016; hypoxemic resp failure--d/c'd home with home oxygen therapy   Pulmonary nodule, left 08/2021   LUL 11 mm ground glass, needs f/u CT 08/2022.   Seasonal allergic rhinitis    Subclinical hyperthyroidism 2018   Takotsubo cardiomyopathy    a. 08/2016 Echo: EF 30-35%, gr1DD, PASP ;  b. 08/2016 Cath: nonobs Dzs;  c. 11/2016 Echo: EF 40-45%, no rwma, nl RV fxn, mild TR, PASP .  ECHO 02/2017 EF 50-55%, grd II DD---essentially resolved Takotsubo 02/2017.   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 08/2016 in setting of diverticulitis & pneumoperitoneum and Takotsubo CM -->prolonged QT, seen by EP-->avoid meds with potential for QT prolongation.   Past Surgical History:  Procedure Laterality Date   ABDOMINAL HYSTERECTOMY  1997   APPENDECTOMY     CARDIAC CATHETERIZATION N/A 09/14/2016   Procedure: Left Heart Cath and Coronary Angiography;  Surgeon: Lennette Bihari, MD;  Location: [facility] INVASIVE CV LAB;  Service: Cardiovascular; Mild nonobstructive CAD with 85% smooth narrowing in the first diagonal branch of the LAD; 10% smooth narrowing of the ostial proximal left circumflex coronary artery; and a normal dominant RCA.   CARPAL TUNNEL RELEASE     CATARACT EXTRACTION Bilateral    CHOLECYSTECTOMY  2002   COLONOSCOPY  10/24/00; 08/27/19   2002 normal.  [employer] hemoccult testing via [facility] 06/18/15 was NEG.  2020->diverticulosis o/w nl.   dexa  03/2016; 03/2018   03/2018 T-score L femoral neck  -2.7 (worsened compared to osteopenic range 2017.) DEXA 05/03/21 stable T score -2.6 on boniva x 2 yrs->plan rpt DEXA 2 yrs.   ESOPHAGOGASTRODUODENOSCOPY  08/27/2019   Normal.   IR GENERIC HISTORICAL  09/19/2016   IR SINUS/FIST  TUBE CHK-NON GI 09/19/2016 Gilmer Mor, DO [facility]   IR GENERIC HISTORICAL  10/06/2016   IR RADIOLOGIST EVAL & MGMT 10/06/2016 [facility]   IR GENERIC HISTORICAL  10/26/2016   IR RADIOLOGIST EVAL & MGMT 10/26/2016 [facility]   IR GENERIC HISTORICAL  11/03/2016   IR RADIOLOGIST EVAL & MGMT 11/03/2016 Gershon Crane, PA-C [facility] INTER[facility]   IR GENERIC HISTORICAL  11/24/2016   IR RADIOLOGIST EVAL & MGMT 11/24/2016 Gershon Crane, PA-C [facility]   IR GENERIC HISTORICAL  12/05/2016   Fistula smaller/improving.  IR SINUS/FIST TUBE CHK-NON GI 12/05/2016 Simonne Come, MD [facility]   IR GENERIC HISTORICAL  12/22/2016   IR RADIOLOGIST EVAL & MGMT 12/22/2016 Berdine Dance, MD [facility]   IR RADIOLOGIST EVAL & MGMT  01/10/2017   IR RADIOLOGIST EVAL & MGMT  02/09/2017   IR SINUS/FIST TUBE CHK-NON GI  03/28/2017   LEFT HEART CATHETERIZATION WITH CORONARY ANGIOGRAM N/A 05/30/2012   Procedure: LEFT HEART CATHETERIZATION WITH CORONARY ANGIOGRAM;  Surgeon: Kathleene Hazel, MD;  Location: [facility] CATH LAB;  Service: Cardiovascular: Mild, non-obstructive CAD   TRANSTHORACIC ECHOCARDIOGRAM  09/14/2016; 11/2016   08/2016: EF 30-35 %, Akinesis of the mid-apicalanteroseptal myocardium.  Grade I DD.  Mild pulm HTN.  Repeat echo 11/2016, EF 40-45%, no rwma, nl RV fxn, mild TR, PASP .     TRANSTHORACIC ECHOCARDIOGRAM  03/07/2017   EF 50-55%. Hypokinesis of the distal septum with overall low normal LV,  systolic function; mild diastolic dysfunction with elevated LV  filling pressure; mildly calcified aortic valve with mild AI; small pericardial effusion   TRANSTHORACIC ECHOCARDIOGRAM     04/2022 EF 60-65%, no WMA, no valve probs   Zio patch  11/2022   3 day:  primarily sinus, avg HR 97, brief run SVT, rare PVC     A IV Location/Drains/Wounds Patient Lines/Drains/Airways Status     Active Line/Drains/Airways     Name Placement date Placement time Site Days    Peripheral IV 03/07/2023 20 G Left Antecubital 03/14/2023  --  Antecubital  1            Intake/Output Last 24 hours  Intake/Output Summary (Last 24 hours) at 02/27/2023 0948 Last data filed at 02/27/2023 0551 Gross per 24 hour  Intake 230 ml  Output 1250 ml  Net -1020 ml    Labs/Imaging Results for orders placed or performed during the hospital encounter of 03/07/2023 (from the past 48 hour(s))  CBC with Differential     Status: Abnormal   Collection Time: 03/24/2023  7:09 PM  Result Value Ref Range   WBC 8.6 4.0 - 10.5 K/uL   RBC 4.53 3.87 - 5.11 MIL/uL   Hemoglobin 12.1 12.0 - 15.0 g/dL   HCT 16.1 09.6 - 04.5 %   MCV 86.3 80.0 - 100.0 fL   MCH 26.7 26.0 - 34.0 pg   MCHC 30.9 30.0 - 36.0 g/dL   RDW 40.9 (H) 81.1 - 91.4 %   Platelets 282 150 - 400 K/uL   nRBC 0.0 0.0 - 0.2 %   Neutrophils Relative % 76 %   Neutro Abs 6.4 1.7 - 7.7 K/uL   Lymphocytes Relative 13 %   Lymphs Abs 1.1 0.7 - 4.0 K/uL   Monocytes Relative 11 %   Monocytes Absolute 1.0 0.1 - 1.0 K/uL   Eosinophils Relative 0 %   Eosinophils Absolute 0.0 0.0 - 0.5 K/uL   Basophils Relative 0 %   Basophils Absolute 0.0 0.0 - 0.1 K/uL   Immature Granulocytes 0 %   Abs Immature Granulocytes 0.02 0.00 - 0.07 K/uL    Comment: Performed at [facility], [address], [place], [place] [address]  Comprehensive metabolic panel     Status: Abnormal   Collection Time: 03/13/2023  7:09 PM  Result Value Ref Range   Sodium 126 (L) 135 - 145 mmol/L   Potassium 4.5 3.5 - 5.1 mmol/L   Chloride 91 (L) 98 - 111 mmol/L   CO2 23 22 - 32 mmol/L   Glucose, Bld 154 (H) 70 - 99 mg/dL    Comment: Glucose reference range applies only to samples taken after fasting for at least 8 hours.   BUN 10 8 - 23 mg/dL   Creatinine, Ser 6.21 (H) 0.44 - 1.00 mg/dL   Calcium 8.9 8.9 - 30.8 mg/dL   Total Protein 6.6 6.5 - 8.1 g/dL   Albumin 3.2 (L) 3.5 - 5.0 g/dL   AST 24 15 - 41 U/L  ALT 15 0 - 44 U/L   Alkaline Phosphatase 72 38 - 126 U/L    Total Bilirubin 0.2 (L) 0.3 - 1.2 mg/dL   GFR, Estimated 56 (L) >60 mL/min    Comment: (NOTE) Calculated using the CKD-EPI Creatinine Equation (2021)    Anion gap 12 5 - 15    Comment: Performed at [facility], [address] [place], [place] [address]  Brain natriuretic peptide     Status: Abnormal   Collection Time: 03/13/2023  7:09 PM  Result Value Ref Range   B Natriuretic Peptide 889.7 (H) 0.0 - 100.0 pg/mL    Comment: Performed at [facility], [address], [place], [place] [address]  Troponin I (High Sensitivity)     Status: Abnormal   Collection Time: 03/14/2023  7:09 PM  Result Value Ref Range   Troponin I (High Sensitivity) 19 (H) <18 ng/L    Comment: (NOTE) Elevated high sensitivity troponin I (hsTnI) values and significant  changes across serial measurements may suggest ACS but many other  chronic and acute conditions are known to elevate hsTnI results.  Refer to the "Links" section for chest pain algorithms and additional  guidance. Performed at [facility], [address] [place], [place] [address]   I-Stat venous blood gas, [facility], [facility], [facility])     Status: Abnormal   Collection Time: 02/28/2023  7:22 PM  Result Value Ref Range   pH, Ven 7.301 7.25 - 7.43   pCO2, Ven 54.1 44 - 60 mmHg   pO2, Ven 37 32 - 45 mmHg   Bicarbonate 26.7 20.0 - 28.0 mmol/L   TCO2 28 22 - 32 mmol/L   O2 Saturation 64 %   Acid-base deficit 1.0 0.0 - 2.0 mmol/L   Sodium 129 (L) 135 - 145 mmol/L   Potassium 4.6 3.5 - 5.1 mmol/L   Calcium, Ion 1.19 1.15 - 1.40 mmol/L   HCT 40.0 36.0 - 46.0 %   Hemoglobin 13.6 12.0 - 15.0 g/dL   Sample type VENOUS    Comment NOTIFIED PHYSICIAN   I-stat chem 8, ED (not at [facility], [facility] or [facility])     Status: Abnormal   Collection Time: 03/09/2023  7:23 PM  Result Value Ref Range   Sodium 129 (L) 135 - 145 mmol/L   Potassium 4.6 3.5 - 5.1 mmol/L   Chloride 91 (L) 98 - 111 mmol/L   BUN 11 8 - 23 mg/dL   Creatinine, Ser 9.14 0.44 - 1.00 mg/dL    Glucose, Bld 782 (H) 70 - 99 mg/dL    Comment: Glucose reference range applies only to samples taken after fasting for at least 8 hours.   Calcium, Ion 1.18 1.15 - 1.40 mmol/L   TCO2 27 22 - 32 mmol/L   Hemoglobin 13.9 12.0 - 15.0 g/dL   HCT 95.6 21.3 - 08.6 %  Resp panel by RT-PCR (RSV, Flu A&B, Covid) Anterior Nasal Swab     Status: None   Collection Time: 02/25/2023  7:24 PM   Specimen: Anterior Nasal Swab  Result Value Ref Range   SARS Coronavirus 2 by RT PCR NEGATIVE NEGATIVE   Influenza A by PCR NEGATIVE NEGATIVE   Influenza B by PCR NEGATIVE NEGATIVE    Comment: (NOTE) The Xpert Xpress SARS-CoV-2/FLU/RSV plus assay is intended as an aid in the diagnosis of influenza from Nasopharyngeal swab specimens and should not be used as a sole basis for treatment. Nasal washings and aspirates are  unacceptable for Xpert Xpress SARS-CoV-2/FLU/RSV testing.  Fact Sheet for Patients: BloggerCourse.com  Fact Sheet for Healthcare Providers: SeriousBroker.it  This test is not yet approved or cleared by the [place] FDA and has been authorized for detection and/or diagnosis of SARS-CoV-2 by FDA under an Emergency Use Authorization (EUA). This EUA will remain in effect (meaning this test can be used) for the duration of the COVID-19 declaration under Section 564(b)(1) of the Act, 21 U.S.C. section 360bbb-3(b)(1), unless the authorization is terminated or revoked.     Resp Syncytial Virus by PCR NEGATIVE NEGATIVE    Comment: (NOTE) Fact Sheet for Patients: BloggerCourse.com  Fact Sheet for Healthcare Providers: SeriousBroker.it  This test is not yet approved or cleared by the [place] FDA and has been authorized for detection and/or diagnosis of SARS-CoV-2 by FDA under an Emergency Use Authorization (EUA). This EUA will remain in effect (meaning this test can be used) for the  duration of the COVID-19 declaration under Section 564(b)(1) of the Act, 21 U.S.C. section 360bbb-3(b)(1), unless the authorization is terminated or revoked.  Performed at [facility], [address], [place], [place] [address]   Troponin I (High Sensitivity)     Status: None   Collection Time: 03/10/2023  9:14 PM  Result Value Ref Range   Troponin I (High Sensitivity) 17 <18 ng/L    Comment: (NOTE) Elevated high sensitivity troponin I (hsTnI) values and significant  changes across serial measurements may suggest ACS but many other  chronic and acute conditions are known to elevate hsTnI results.  Refer to the "Links" section for chest pain algorithms and additional  guidance. Performed at [facility], [address], [place], [place] [address]   CBG monitoring, ED     Status: Abnormal   Collection Time: 02/28/2023  9:42 PM  Result Value Ref Range   Glucose-Capillary 205 (H) 70 - 99 mg/dL    Comment: Glucose reference range applies only to samples taken after fasting for at least 8 hours.  CBG monitoring, ED     Status: Abnormal   Collection Time: 02/27/23 12:30 AM  Result Value Ref Range   Glucose-Capillary 214 (H) 70 - 99 mg/dL    Comment: Glucose reference range applies only to samples taken after fasting for at least 8 hours.  CBC with Differential     Status: Abnormal   Collection Time: 02/27/23  2:12 AM  Result Value Ref Range   WBC 7.3 4.0 - 10.5 K/uL   RBC 4.20 3.87 - 5.11 MIL/uL   Hemoglobin 11.2 (L) 12.0 - 15.0 g/dL   HCT 13.2 (L) 44.0 - 10.2 %   MCV 84.0 80.0 - 100.0 fL   MCH 26.7 26.0 - 34.0 pg   MCHC 31.7 30.0 - 36.0 g/dL   RDW 72.5 (H) 36.6 - 44.0 %   Platelets 199 150 - 400 K/uL   nRBC 0.0 0.0 - 0.2 %   Neutrophils Relative % 94 %   Neutro Abs 6.9 1.7 - 7.7 K/uL   Lymphocytes Relative 4 %   Lymphs Abs 0.3 (L) 0.7 - 4.0 K/uL   Monocytes Relative 1 %   Monocytes Absolute 0.1 0.1 - 1.0 K/uL   Eosinophils Relative 0 %   Eosinophils Absolute 0.0  0.0 - 0.5 K/uL   Basophils Relative 0 %   Basophils Absolute 0.0 0.0 - 0.1 K/uL   Immature Granulocytes 1 %   Abs Immature Granulocytes 0.04 0.00 - 0.07 K/uL  Comment: Performed at [facility], [address], [place], [place] [address]  Basic metabolic panel     Status: Abnormal   Collection Time: 02/27/23  2:12 AM  Result Value Ref Range   Sodium 126 (L) 135 - 145 mmol/L   Potassium 4.0 3.5 - 5.1 mmol/L   Chloride 92 (L) 98 - 111 mmol/L   CO2 23 22 - 32 mmol/L   Glucose, Bld 260 (H) 70 - 99 mg/dL    Comment: Glucose reference range applies only to samples taken after fasting for at least 8 hours.   BUN 9 8 - 23 mg/dL   Creatinine, Ser 9.56 (H) 0.44 - 1.00 mg/dL   Calcium 8.2 (L) 8.9 - 10.3 mg/dL   GFR, Estimated 55 (L) >60 mL/min    Comment: (NOTE) Calculated using the CKD-EPI Creatinine Equation (2021)    Anion gap 11 5 - 15    Comment: Performed at [facility], [address], [place], [place] [address]  Magnesium     Status: None   Collection Time: 02/27/23  2:12 AM  Result Value Ref Range   Magnesium 2.2 1.7 - 2.4 mg/dL    Comment: Performed at [facility], [address], [place], [place] [address]  CBG monitoring, ED     Status: Abnormal   Collection Time: 02/27/23  5:23 AM  Result Value Ref Range   Glucose-Capillary 198 (H) 70 - 99 mg/dL    Comment: Glucose reference range applies only to samples taken after fasting for at least 8 hours.  CBG monitoring, ED     Status: Abnormal   Collection Time: 02/27/23  7:43 AM  Result Value Ref Range   Glucose-Capillary 179 (H) 70 - 99 mg/dL    Comment: Glucose reference range applies only to samples taken after fasting for at least 8 hours.   CT CHEST W CONTRAST  Result Date: 02/27/2023 CLINICAL DATA:  COPD and hypoxia. History of limited stage small cell lung cancer treated in 2010. History of heart disease. EXAM: CT CHEST WITH CONTRAST TECHNIQUE: Multidetector CT imaging of the chest was performed during  intravenous contrast administration. RADIATION DOSE REDUCTION: This exam was performed according to the departmental dose-optimization program which includes automated exposure control, adjustment of the mA and/or kV according to patient size and/or use of iterative reconstruction technique. CONTRAST:  50mL OMNIPAQUE IOHEXOL 350 MG/ML SOLN COMPARISON:  Portable chest today. Numerous prior CTs. The 3 most recent are CTA chest 11/10/2022, CTA chest 05/21/2022, and chest CT no contrast 09/08/2021. FINDINGS: Cardiovascular: The cardiac size is normal overall, but with a prominent left ventricular cavity, which may be seen with cardiomyopathy and prior ischemic disease. There is partial calcification of the mitral annulus. Scattered three-vessel coronary artery calcific plaques. Pulmonary arteries are normal caliber, centrally clear on this nondedicated examination. There is a chronic circumferential pericardial effusion which has increased since 11/10/2022. Previously this was 1.1 cm in thickness now up to 1.5 cm in thickness. The pulmonary veins are decompressed. There is diffuse moderate to heavy aortic calcific plaque, scattered calcifications of the great vessels. There is no aneurysm, stenosis or dissection. Mediastinum/Nodes: There is mild thickening of the thoracic esophagus. No greater than previously, probably related to prior XRT. There is chronic soft tissue thickening in the subcarinal space and along the right mid to upper hilum which were seen previously and also probably from XRT. There is a single mildly prominent left mid hilar lymph node measuring 1.1 cm  short axis on 3:61 probably reactive. No other adenopathy is seen. There is no thyroid or axillary mass. The trachea is patent. No thyroid mass is seen. Lungs/Pleura: There is interval new finding of moderate size right and small left layering pleural effusions There is small increased right lower lobe anterior infrahilar consolidation which could be  atelectasis or a small pneumonia. There is diffuse bronchial thickening. Respiratory motion limits fine detail in lung fields. There are small new areas of ground-glass disease in the apex of the left upper lobe which are probably inflammatory/infectious. 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 The lungs are generally otherwise clear. No recurrent tumor is suspected. No interstitial edema is seen. Upper Abdomen: Old cholecystectomy. Abdominal aortic atherosclerosis. No acute abnormality. No adrenal or liver mass enhancement. Musculoskeletal: There is osteopenia, degenerative changes and mild kyphosis of the thoracic spine, and a mild lower plate anterior wedge compression fracture deformity of the L1 vertebral body. No primary or pathologic bone lesion is seen. IMPRESSION: 1. New moderate right and small left layering pleural effusions, interval increase in chronic pericardial effusion also. No findings of acute pulmonary edema. 2. Small increased right lower lobe anterior infrahilar consolidation which could be atelectasis or a small pneumonia. 3. Small new areas of ground-glass disease in the apex of the left upper lobe which are probably inflammatory/infectious. 4. Diffusely thickened bronchi. 5. Slightly prominent single left mid hilar lymph node is likely reactive. 6. 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 7. Aortic and coronary artery atherosclerosis. 8. Prominent left ventricular cavity, which may be seen with cardiomyopathy and prior ischemic disease. 9. Chronic soft tissue thickening in the subcarinal space and along the right mid to upper hilum which were seen previously and also compatible with XRT. Aortic Atherosclerosis (ICD10-I70.0). Electronically Signed   By: Almira Bar M.D.   On: 02/27/2023 01:10   DG Chest Port 1 View  Result Date:  03/12/2023 CLINICAL DATA:  Shortness of breath. EXAM: PORTABLE CHEST 1 VIEW COMPARISON:  Chest radiograph dated 10/18/2022 and CT dated 11/10/2022. FINDINGS: Right perihilar and right upper lobe opacities as seen on the prior CT and corresponding to post radiation changes. No new consolidative changes. There is no pleural effusion or pneumothorax. Stable cardiac silhouette. No acute osseous pathology. IMPRESSION: 1. No acute cardiopulmonary process. 2. 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 Electronically Signed   By: Elgie Collard M.D.   On: 03/11/2023 19:40    Pending Labs Unresulted Labs (From admission, onward)     Start     Ordered   03/09/2023 0500  Creatinine, serum  (enoxaparin (LOVENOX)    CrCl >/= 30 ml/min)  Weekly,   R     Comments: while on enoxaparin therapy    02/27/23 0204   02/27/23 0000  Culture, blood (Routine X 2) w Reflex to ID Panel  BLOOD CULTURE X 2,   R (with TIMED occurrences)      03/01/2023 2359            Vitals/Pain Today's Vitals   02/27/23 0500 02/27/23 0520 02/27/23 0630 02/27/23 0845  BP: (!) 101/51  (!) 91/42 (!) 97/56  Pulse: 96  89 94  Resp: 17  18 18   Temp:  98.4 F (36.9 C)    TempSrc:  Oral    SpO2: 100%  94% 99%  Weight:      Height:  PainSc:        Isolation Precautions No active isolations  Medications Medications  aspirin EC tablet 81 mg (81 mg Oral Given 02/27/23 0836)  atorvastatin (LIPITOR) tablet 40 mg (40 mg Oral Given 02/27/23 0836)  buPROPion (WELLBUTRIN XL) 24 hr tablet 300 mg (300 mg Oral Given 02/27/23 0836)  albuterol (PROVENTIL) (2.5 MG/3ML) 0.083% nebulizer solution 2.5 mg (has no administration in time range)  ipratropium-albuterol (DUONEB) 0.5-2.5 (3) MG/3ML nebulizer solution 3 mL (3 mLs Nebulization Given 02/27/23 0834)  Ampicillin-Sulbactam (UNASYN) 3 g in sodium chloride 0.9 % 100 mL IVPB (0 g Intravenous Stopped 02/27/23 0551)  insulin aspart (novoLOG) injection 0-15 Units (3 Units Subcutaneous Given 02/27/23 0836)   pantoprazole (PROTONIX) injection 40 mg (40 mg Intravenous Given 02/27/23 0835)  arformoterol (BROVANA) nebulizer solution 15 mcg (15 mcg Nebulization Given 02/27/23 0834)  insulin aspart protamine- aspart (NOVOLOG MIX 70/30) injection 5 Units (5 Units Subcutaneous Given 02/27/23 0835)  methylPREDNISolone sodium succinate (SOLU-MEDROL) 40 mg/mL injection 40 mg (40 mg Intravenous Given 02/27/23 0834)    Followed by  predniSONE (DELTASONE) tablet 40 mg (has no administration in time range)  enoxaparin (LOVENOX) injection 40 mg (40 mg Subcutaneous Given 02/27/23 0834)  acetaminophen (TYLENOL) tablet 650 mg (has no administration in time range)    Or  acetaminophen (TYLENOL) suppository 650 mg (has no administration in time range)  senna-docusate (Senokot-S) tablet 1 tablet (has no administration in time range)  ondansetron (ZOFRAN) tablet 4 mg (has no administration in time range)    Or  ondansetron (ZOFRAN) injection 4 mg (has no administration in time range)  furosemide (LASIX) tablet 40 mg (40 mg Oral Not Given 02/27/23 0907)  albuterol (PROVENTIL) (2.5 MG/3ML) 0.083% nebulizer solution (15 mg/hr Nebulization Given 03/03/2023 1927)  ipratropium (ATROVENT) nebulizer solution 1 mg (1 mg Nebulization Given 03/19/2023 1928)  methylPREDNISolone sodium succinate (SOLU-MEDROL) 125 mg/2 mL injection 125 mg (125 mg Intravenous Given 03/09/2023 1920)  magnesium sulfate IVPB 2 g 50 mL (0 g Intravenous Stopped 03/16/2023 2019)  furosemide (LASIX) injection 40 mg (40 mg Intravenous Given 03/14/2023 2115)  iohexol (OMNIPAQUE) 350 MG/ML injection 50 mL (50 mLs Intravenous Contrast Given 02/27/23 0027)    Mobility Unsure, have not seen patient out of bed     Focused Assessments Pulmonary Assessment Handoff:  Lung sounds: Bilateral Breath Sounds: Coarse crackles O2 Device: Nasal Cannula O2 Flow Rate (L/min): 2 L/min    R Recommendations: See Admitting Provider Note  Report given to:   Additional Notes: Pt oriented to  person, basically time thought is 2023, and place, she knew it was the hospital, held Lasix for soft BP, provider aware, Echo complete, speech at bedside now, did swallow PO pills without difficulty this am, does have a cough, concern for aspiration?

## 2023-02-27 NOTE — Assessment & Plan Note (Signed)
Continue blood pressure monitoring  

## 2023-02-27 NOTE — Evaluation (Signed)
Physical Therapy Evaluation Patient Details Name: [name] MRN: [id number] DOB: 01/13/1946 Today's Date: 02/27/2023     Clinical Impression  Pt presents to PT likely not far from baseline. Pt reports she typically ambulates for household distances with assistance of her grandson. Pt is able to ambulate within the hospital room with assistance of PT at this time along with UE support of IV pole. Pt will benefit from frequent mobilization in an effort to improve endurance and balance. PT recommends discharge home with HHPT when medically appropriate.         02/27/23 0325  PT Visit Information  Last PT Received On 02/27/23  Assistance Needed +1  History of Present Illness 77 y.o. female presents to [facility] hospital on 02/26/2023 with nausea and vomiting, respiratory distress. PMHx: DM, COPD, CAD, HLD, HTN, lung cancer, GERD, glaucoma, CVA, ckd, and diverticulitis.  Precautions  Precautions Fall  Precaution Comments history of posterior falls  Restrictions  Weight Bearing Restrictions No  Home Living  Family/patient expects to be discharged to: Private residence  Living Arrangements Children;Other relatives  Available Help at Discharge Family  Type of Home House  Home Access Stairs to enter  Entrance Stairs-Number of Steps 4  Entrance Stairs-Rails Left;Right  Home Layout One level  Bathroom Shower/Tub Tub/shower unit  Network engineer (2 wheels);Shower seat  Additional Comments 77 y.o. grandson stays home to assist  Prior Function  Prior Level of Function  Needs assist  Mobility Comments ambulates short household distances with assist of family, reports not using DME  ADLs Comments assist with bathing and IADLs  Communication  Communication HOH  Pain Assessment  Pain Assessment No/denies pain  Cognition  Arousal/Alertness Awake/alert  Behavior During Therapy WFL for tasks assessed/performed  Overall  Cognitive Status Within Functional Limits for tasks assessed  Upper Extremity Assessment  Upper Extremity Assessment Generalized weakness  Lower Extremity Assessment  Lower Extremity Assessment Generalized weakness  Cervical / Trunk Assessment  Cervical / Trunk Assessment Kyphotic  Bed Mobility  Overal bed mobility Needs Assistance  Bed Mobility Supine to Sit  Supine to sit Min assist;HOB elevated  Transfers  Overall transfer level Needs assistance  Equipment used 1 person hand held assist  Transfers Sit to/from Stand  Sit to Stand Min assist  Ambulation/Gait  Ambulation/Gait assistance Min assist  Gait Distance (Feet) 20 Feet  Assistive device IV Pole  Gait Pattern/deviations Step-to pattern;Drifts right/left  General Gait Details pt with slowed step-to gait, increased lateral sway  Gait velocity reduced  Gait velocity interpretation <1.31 ft/sec, indicative of household ambulator  Balance  Overall balance assessment Needs assistance  Sitting-balance support No upper extremity supported;Feet supported  Sitting balance-Leahy Scale Fair  Standing balance support Single extremity supported;Reliant on assistive device for balance  Standing balance-Leahy Scale Poor  Standing balance comment posterior lean  General Comments  General comments (skin integrity, edema, etc.) VSS on RA, pt incontinent of urine prior to PT arrival  PT - End of Session  Equipment Utilized During Treatment Gait belt  Activity Tolerance Patient tolerated treatment well  Patient left in chair;with call bell/phone within reach;with chair alarm set  Nurse Communication Mobility status  PT Assessment  PT Recommendation/Assessment Patient needs continued PT services  PT Visit Diagnosis Other abnormalities of gait and mobility (R26.89);Muscle weakness (generalized) (M62.81)  PT Problem List Decreased strength;Decreased activity tolerance;Decreased balance;Decreased mobility;Decreased knowledge of use of DME  PT  Plan  PT Frequency (ACUTE ONLY)  Min 2X/week  PT Treatment/Interventions (ACUTE ONLY) Gait training;DME instruction;Stair training;Functional mobility training;Therapeutic activities;Therapeutic exercise;Balance training;Neuromuscular re-education;Patient/family education  AM-PAC PT "6 Clicks" Mobility Outcome Measure (Version 2)  Help needed turning from your back to your side while in a flat bed without using bedrails? 3  Help needed moving from lying on your back to sitting on the side of a flat bed without using bedrails? 3  Help needed moving to and from a bed to a chair (including a wheelchair)? 3  Help needed standing up from a chair using your arms (e.g., wheelchair or bedside chair)? 3  Help needed to walk in hospital room? 3  Help needed climbing 3-5 steps with a railing?  2  6 Click Score 17  Consider Recommendation of Discharge To: Home with [facility]  Progressive Mobility  What is the highest level of mobility based on the progressive mobility assessment? Level 5 (Walks with assist in room/hall) - Balance while stepping forward/back and can walk in room with assist - Complete  Mobility Referral Yes  Activity Ambulated with assistance in room  PT Recommendation  Follow Up Recommendations Home health PT  Assistance recommended at discharge PRN  Patient can return home with the following A little help with walking and/or transfers;A little help with bathing/dressing/bathroom;Assistance with cooking/housework;Assist for transportation;Help with stairs or ramp for entrance;Direct supervision/assist for medications management;Direct supervision/assist for financial management  Functional Status Assessment Patient has had a recent decline in their functional status and demonstrates the ability to make significant improvements in function in a reasonable and predictable amount of time.  PT equipment None recommended by PT  Acute Rehab PT Goals  Patient Stated Goal to return home  PT Goal  Formulation With patient  Time For Goal Achievement 03/13/23  Potential to Achieve Goals Good  PT Time Calculation  PT Start Time (ACUTE ONLY) 1502  PT Stop Time (ACUTE ONLY) 1525  PT Time Calculation (min) (ACUTE ONLY) 23 min  PT General Charges  $$ ACUTE PT VISIT 1 Visit  PT Evaluation  $PT Eval Low Complexity 1 Low  Written Expression  Dominant Hand Right    Arlyss Gandy, PT, DPT Acute Rehabilitation Office [phone]   Arlyss Gandy 02/27/2023, 3:45 PM

## 2023-02-27 NOTE — Progress Notes (Signed)
Progress Note   Patient: [name] ZOX:[id number] DOB: [birth year] DOA: 03/21/2023     1 DOS: the patient was seen and examined on 02/27/2023   Brief hospital course: Mrs. Venturo was admitted to the hospital with the working diagnosis of acute hypoxemic respiratory failure due to COPD exacerbation.   77 yo female with the past medical history of non ischemic, stress induced cardiomyopathy, COPD, T2DM, hypertension, history of small cell lung cancer, history of CVA, depression and dementia who presented with dyspnea. Patient reported 3 days of worsening cough with increase sputum production, persistent despite use of nebulizer treatments at home. On the day of hospitalization she developed wheezing, and respiratory distress. EMS was called, patient was placed on Cpap and transported to the ED. Note that she vomited while on Cpap and then this device was removed. On her initial physical examination in the ED her blood pressure was 113/75, HR 113, RR 16 and 02 saturation 99%, her lungs had coarse breath sounds, and positive wheezing, heart with S1 and S2 present and rhythmic, abdomen with no distention and no lower extremity edema.   Na 126, K 4,5 CL 91, bicarbonate 23, glucose 154, bun 10 cr 1.0 BNP 889 High sensitive troponin 19  Wbc 8,6 hgb 12.1 plt 282  Sars covid 19 negative   Chest radiograph with hyperinflation, right upper love scarring, right sided pleural effusion.  CT chest with bilateral ground glass opacities, right upper lobe scarring. Right pleural effusion, interval increase in chronic pericardial effusion. Small increased right lower lobe anterior infrahilar consolidation. 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   EKG 111 bpm, normal axis, left bundle branch block, qtc less than 500, sinus rhythm with PAC with aberrancy, q wave V1 to V3, no significant ST segment or T wave changes.    Assessment and Plan: COPD with acute exacerbation (HCC) Acute  hypoxemic respiratory failure. Right lower lobe pneumonia.  Improved dyspnea, her oxygenation today is 98% on 2 L/min per [place] Plan to continue antibiotic therapy with Unasyn for possible aspiration.  Continue with bronchodilator therapy, and inhaled corticosteroids.  Out of bed to chair tid with meals. Airway clearing techniques with flutter valve and incentive spirometer.   Takotsubo cardiomyopathy Acute on chronic systolic heart failure.   Echocardiogram with reduced LV systolic function to 20%, no LVH, RV systolic function is preserved, small pericardial effusion, moderate MR, mild aortic stenosis.   At the time of my examination no signs of volume overload. Continue blood pressure monitoring Hold on IV furosemide.   Hyponatremia Renal function with serum cr at 1,0 with K at 4,0 and serum bicarbonate at 23. Na 126 and mg 2,2   Plan to hold on diuresis for now. Continue supportive medical care Follow up renal function in am. Consult nutrition and liberate diet.   Essential hypertension Continue blood pressure monitoring   Dementia without behavioral disturbance (HCC) Continue neuro checks per unit protocol. Today with no agitation.   Type 2 diabetes mellitus with hyperlipidemia (HCC) Hyperglycemia. Continue glucose cover and monitoring with insulin sliding scale. Continue with statin therapy.         Subjective: Patient is feeling better, her dyspnea has improved, but not back to baseline   Physical Exam: Vitals:   02/27/23 1012 02/27/23 1015 02/27/23 1030 02/27/23 1229  BP:  (!) 104/56 (!) 95/58   Pulse:  (!) 106 100   Resp:  20 (!) 22   Temp: 97.8 F (36.6 C)     TempSrc: Oral  SpO2:  99% 97% 98%  Weight:      Height:       Neurology awake and alert ENT with mild pallor Cardiovascular with S1 and S2 present and rhythmic with no gallops, rubs or murmurs No JVD No lower extremity edema Respiratory with bilateral rhonchi, rales and scattered  wheezing.  Abdomen with no distention   Data Reviewed:    Family Communication: no family at the bedside   Disposition: Status is: Inpatient Remains inpatient appropriate because: respiratory support   Planned Discharge Destination: Home     Author: Coralie Keens, MD 02/27/2023 12:53 PM  For on call review [email or web address].

## 2023-02-27 NOTE — Hospital Course (Signed)
[name] was admitted to the hospital with the working diagnosis of acute hypoxemic respiratory failure due to COPD exacerbation.   77 yo female with the past medical history of non ischemic, stress induced cardiomyopathy, COPD, T2DM, hypertension, history of small cell lung cancer, history of CVA, depression and dementia who presented with dyspnea. Patient reported 3 days of worsening cough with increase sputum production, persistent despite use of nebulizer treatments at home. On the day of hospitalization she developed wheezing, and respiratory distress. EMS was called, patient was placed on Cpap and transported to the ED. Note that she vomited while on Cpap and then this device was removed. On her initial physical examination in the ED her blood pressure was 113/75, HR 113, RR 16 and 02 saturation 99%, her lungs had coarse breath sounds, and positive wheezing, heart with S1 and S2 present and rhythmic, abdomen with no distention and no lower extremity edema.   Na 126, K 4,5 CL 91, bicarbonate 23, glucose 154, bun 10 cr 1.0 BNP 889 High sensitive troponin 19  Wbc 8,6 hgb 12.1 plt 282  Sars covid 19 negative   Chest radiograph with hyperinflation, right upper love scarring, right sided pleural effusion.  CT chest with bilateral ground glass opacities, right upper lobe scarring. Right pleural effusion, interval increase in chronic pericardial effusion. Small increased right lower lobe anterior infrahilar consolidation. 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   EKG 111 bpm, normal axis, left bundle branch block, qtc less than 500, sinus rhythm with PAC with aberrancy, q wave V1 to V3, no significant ST segment or T wave changes.    05/07 patient continue to have significant dyspnea and wheezing.

## 2023-02-27 NOTE — H&P (Incomplete)
PCP:   [name], MD   Chief Complaint:  ***  HPI: This is a 76-y/o female w/ PMHx significant for Takotsubo syndrome, hypothyroidism,  anxiety and depression, DM Type 2, HLD, HTN, h/o small cell lung cancer, and h/o CVA.  She has a chronic cough since her lung cancer treatment/radiation.  Per patient she was not feeling well yesterday she had nausea and vomiting yesterday and again today.  Her chronic cough has been increasing infrequency over the past 3 to 4 days.  The family increased her nebulizer treatment, given every 4 hours.  Yesterday morning the patient woke up sounding worse,   Not feeling v well, started w/ n/v yesterday N/v again Dtr sent to hosp  Per pt she was not sob +cough. No wheezing  Pt poor hx. Distant  No cp  Congestion - per dtr.. when pt woke her dtr told her she talked funny No abd pain or diarrhea  Review of Systems:  The patient denies anorexia, fever, weight loss,, vision loss, decreased hearing, hoarseness, chest pain, syncope, dyspnea on exertion, peripheral edema, balance deficits, hemoptysis, abdominal pain, melena, hematochezia, severe indigestion/heartburn, hematuria, incontinence, genital sores, muscle weakness, suspicious skin lesions, transient blindness, difficulty walking, depression, unusual weight change, abnormal bleeding, enlarged lymph nodes, angioedema, and breast masses.  Past Medical History: Past Medical History:  Diagnosis Date  . Acute renal failure (HCC)   . Age-related nuclear cataract of both eyes 2016   +cortical age related cataracts OU   . Anxiety and depression   . Bilateral diabetic retinopathy (HCC) 2015   Dr. Ashley Royalty  . Blood transfusion without reported diagnosis    when taking chemo  . Chronic renal insufficiency, stage 3 (moderate) (HCC)    GFR 50s  . 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  . Colonic diverticular abscess   . COPD (chronic obstructive pulmonary  disease) (HCC)   . CVA (cerebral vascular accident) (HCC) 09/2016   MRI did show a left-sided small ischemic stroke which was acute but likely incidental (MRI was done b/c pt had TIA sx's in hospital).  Neuro put her on plavix at that time.  Cardiology d/c'd her plavix 08/2017.  . Diabetes mellitus with complication (HCC)    diab retinopathy OU (laser)  . Diverticulitis of large intestine with perforation and abscess 09/11/2016  . Finger fracture, right 04/2020   R 5th metacarpal fx at the base (Dr. Dion Saucier)  . GERD (gastroesophageal reflux disease)    protonix  . History of concussion 08/25/2018   w/out loss of consciousness--mild increase in baseline memory impairment after.  CT head neg acute.  Marland Kitchen History of diverticulitis of colon    with abscess; required IR percutaneous drain placed 09/2016.  Marland Kitchen History of iron deficiency anemia    started iron 07/02/19.  EGD/colonoscopy 08/27/19 unrevealing. Capsule endoscopy considered but never done.  . Hyperlipidemia   . Hypertension   . Left bundle branch block (LBBB) 12/16/2016  . Lung cancer (HCC)    non-small cell lung ca, stage III in 05/2011; systemic chemotherapy concurrent with radiation followed by prophylactic cranial irradiation and has been observation since July of 2010 with no evidence for disease recurrence-released from onc f/u 12/2014 (needs annual cxr by PCP).  CXR 08/2015 stable.  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 Shirline Frees did bx and this was NEG for malignancy.  . Mild cognitive impairment with memory loss    Likely from brain radiation therapy  . Non-obstructive CAD (coronary  artery disease)    a. Cath 2006 preserved LV fxn, scattered irregularities without critical stenosis; b. 2008 stress echo negative for ischemia, but with hypertensive response; c. 08/2016 Cath: D1 25%, otw nl.  . Normocytic anemia 2018   Iron, vit B12, folate all normal 12/2016.  Marland Kitchen Open-angle glaucoma 2016   Dr. Hanley Seamen, (bilateral)---responding to topical  therapy  . Osteoarthritis of both hands   . Osteoporosis 03/2016; 03/2018   03/2016 DEXA T-score -2.2.  03/2018 T-score L femoral neck  -2.7--boniva started. DEXA 04/2021 T scor -2.6->boniva continued. Plan rpt DEXA 2 yrs.  . Pneumonia    hospitalization 11/2016; hypoxemic resp failure--d/c'd home with home oxygen therapy  . Pulmonary nodule, left 08/2021   LUL 11 mm ground glass, needs f/u CT 08/2022.  . Seasonal allergic rhinitis   . Subclinical hyperthyroidism 2018  . Takotsubo cardiomyopathy    a. 08/2016 Echo: EF 30-35%, gr1DD, PASP ;  b. 08/2016 Cath: nonobs Dzs;  c. 11/2016 Echo: EF 40-45%, no rwma, nl RV fxn, mild TR, PASP .  ECHO 02/2017 EF 50-55%, grd II DD---essentially resolved Takotsubo 02/2017.  . 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 08/2016 in setting of diverticulitis & pneumoperitoneum and Takotsubo CM -->prolonged QT, seen by EP-->avoid meds with potential for QT prolongation.   Past Surgical History:  Procedure Laterality Date  . ABDOMINAL HYSTERECTOMY  1997  . APPENDECTOMY    . CARDIAC CATHETERIZATION N/A 09/14/2016   Procedure: Left Heart Cath and Coronary Angiography;  Surgeon: Lennette Bihari, MD;  Location: [facility] INVASIVE CV LAB;  Service: Cardiovascular; Mild nonobstructive CAD with 85% smooth narrowing in the first diagonal branch of the LAD; 10% smooth narrowing of the ostial proximal left circumflex coronary artery; and a normal dominant RCA.  Marland Kitchen CARPAL TUNNEL RELEASE    . CATARACT EXTRACTION Bilateral   . CHOLECYSTECTOMY  2002  . COLONOSCOPY  10/24/00; 08/27/19   2002 normal.  [employer] hemoccult testing via [facility] 06/18/15 was NEG.  2020->diverticulosis o/w nl.  . dexa  03/2016; 03/2018   03/2018 T-score L femoral neck  -2.7 (worsened compared to osteopenic range 2017.) DEXA 05/03/21 stable T score -2.6 on boniva x 2 yrs->plan rpt DEXA 2 yrs.  . ESOPHAGOGASTRODUODENOSCOPY  08/27/2019   Normal.  . IR GENERIC HISTORICAL  09/19/2016   IR SINUS/FIST TUBE CHK-NON GI  09/19/2016 Gilmer Mor, DO [facility]  . IR GENERIC HISTORICAL  10/06/2016   IR RADIOLOGIST EVAL & MGMT 10/06/2016 [facility]  . IR GENERIC HISTORICAL  10/26/2016   IR RADIOLOGIST EVAL & MGMT 10/26/2016 [facility]  . IR GENERIC HISTORICAL  11/03/2016   IR RADIOLOGIST EVAL & MGMT 11/03/2016 Gershon Crane, PA-C [facility] INTER[facility]  . IR GENERIC HISTORICAL  11/24/2016   IR RADIOLOGIST EVAL & MGMT 11/24/2016 Gershon Crane, PA-C [facility]  . IR GENERIC HISTORICAL  12/05/2016   Fistula smaller/improving.  IR SINUS/FIST TUBE CHK-NON GI 12/05/2016 Simonne Come, MD [facility]  . IR GENERIC HISTORICAL  12/22/2016   IR RADIOLOGIST EVAL & MGMT 12/22/2016 Berdine Dance, MD [facility]  . IR RADIOLOGIST EVAL & MGMT  01/10/2017  . IR RADIOLOGIST EVAL & MGMT  02/09/2017  . IR SINUS/FIST TUBE CHK-NON GI  03/28/2017  . LEFT HEART CATHETERIZATION WITH CORONARY ANGIOGRAM N/A 05/30/2012   Procedure: LEFT HEART CATHETERIZATION WITH CORONARY ANGIOGRAM;  Surgeon: Kathleene Hazel, MD;  Location[facility] CATH LAB;  Service: Cardiovascular: Mild, non-obstructive CAD  . TRANSTHORACIC  ECHOCARDIOGRAM  09/14/2016; 11/2016   08/2016: EF 30-35 %, Akinesis of the mid-apicalanteroseptal myocardium.  Grade I DD.  Mild pulm HTN.  Repeat echo 11/2016, EF 40-45%, no rwma, nl RV fxn, mild TR, PASP .    Marland Kitchen TRANSTHORACIC ECHOCARDIOGRAM  03/07/2017   EF 50-55%. Hypokinesis of the distal septum with overall low normal LV,  systolic function; mild diastolic dysfunction with elevated LV  filling pressure; mildly calcified aortic valve with mild AI; small pericardial effusion  . TRANSTHORACIC ECHOCARDIOGRAM     04/2022 EF 60-65%, no WMA, no valve probs  . Zio patch  11/2022   3 day:  primarily sinus, avg HR 97, brief run SVT, rare PVC    Medications: Prior to Admission medications   Medication Sig Start Date End Date Taking? Authorizing Provider  acetaminophen (TYLENOL) 500 MG tablet Take  1,000 mg by mouth daily as needed for headache.    [provider]  albuterol (PROVENTIL) (2.5 MG/3ML) 0.083% nebulizer solution USE 1 VIAL IN NEBULIZER EVERY 4 HOURS AS NEEDED FOR WHEEZING AND FOR SHORTNESS OF BREATH 02/12/21   McGowen, Maryjean Morn, MD  albuterol (VENTOLIN HFA) 108 (90 Base) MCG/ACT inhaler INHALE 2 PUFFS BY MOUTH EVERY 6 HOURS AS NEEDED FOR WHEEZING OR SHORTNESS OF BREATH 06/05/20   McGowen, Maryjean Morn, MD  aspirin 81 MG EC tablet Take 81 mg by mouth daily.     [provider]  atorvastatin (LIPITOR) 40 MG tablet Take 1 tablet (40 mg total) by mouth daily. 11/18/22   McGowen, Maryjean Morn, MD  budesonide-formoterol (SYMBICORT) 160-4.5 MCG/ACT inhaler Inhale 2 puffs into the lungs 2 (two) times daily. 09/02/22   McGowen, Maryjean Morn, MD  buPROPion (WELLBUTRIN XL) 300 MG 24 hr tablet Take 1 tablet (300 mg total) by mouth daily. 11/18/22   McGowen, Maryjean Morn, MD  Calcium Carb-Cholecalciferol (CALCIUM + VITAMIN D3 PO) Take 1-2 tablets by mouth See admin instructions. Take 1 tablet by mouth in the morning and 2 tablets by mouth at bedtime    [provider]  cetirizine (ZYRTEC) 10 MG tablet Take 10 mg by mouth every evening.    [provider]  ibandronate (BONIVA) 150 MG tablet Take 1 tablet (150 mg total) by mouth every 30 (thirty) days. Take in the morning with a full glass of water, on an empty stomach, and do not take anything else by mouth or lie down for the next 30 min. 08/04/22   McGowen, Maryjean Morn, MD  insulin isophane & regular human KwikPen (HUMULIN 70/30 KWIKPEN) (70-30) 100 UNIT/ML KwikPen 4 units SQ bid 02/17/23   McGowen, Maryjean Morn, MD  ketorolac (ACULAR) 0.5 % ophthalmic solution Place 1 drop into the left eye 4 (four) times daily. 11/01/22   [provider]  Multiple Vitamins-Minerals (MULTIVITAMIN WOMEN PO) Take 1 tablet by mouth daily.    [provider]  [facility] ULTRA test strip See admin instructions. 10/28/22   [provider]   pantoprazole (PROTONIX) 40 MG tablet Take 1 tablet (40 mg total) by mouth 2 (two) times daily. 01/10/23   McGowen, Maryjean Morn, MD  prednisoLONE acetate (PRED FORTE) 1 % ophthalmic suspension Place 1 drop into the left eye. 11/17/22   [provider]  Spacer/Aero-Holding Chambers (AEROCHAMBER MV) inhaler Use as instructed 08/04/22   McGowen, Maryjean Morn, MD    Allergies:   Allergies  Allergen Reactions  . Lactose Intolerance (Gi) Diarrhea  . Motrin [Ibuprofen] Itching, Anxiety and Other (See Comments)    Anxiousness  Hyperventilates     Social History:  reports that she quit smoking about 14 years ago. Her smoking use included cigarettes. She has a 45.00 pack-year smoking history. She has never used smokeless tobacco. She reports that she does not drink alcohol and does not use drugs.  Family History: Family History  Problem Relation Age of Onset  . Heart failure Mother   . Kidney disease Mother        renal failure  . Diabetes Mother   . Diabetes Father   . Diabetes Brother   . Heart disease Brother   . Heart attack Brother   . Heart attack Brother   . Heart attack Brother   . Pancreatic cancer Brother   . Colon cancer Neg Hx   . Esophageal cancer Neg Hx   . Stomach cancer Neg Hx   . Rectal cancer Neg Hx     Physical Exam: Vitals:   02/27/2023 1910 02/27/2023 1912 02/27/2023 1928  BP: 113/75    Pulse: (!) 113    Resp: 16    Temp: (!) 97.4 F (36.3 C)    TempSrc: Oral    SpO2: 99% 99% 100%  Weight: 61 kg    Height: 5' (1.524 m)      General:  Alert and oriented times three, well developed and nourished, no acute distress Eyes: Pink conjunctiva, no scleral icterus ENT: Moist oral mucosa, neck supple, no thyromegaly Lungs: course throughout, mild wheeze, no use of accessory muscles Cardiovascular: tachycardic, regular rate and rhythm, no regurgitation, no gallops, no murmurs. No carotid bruits, no JVD Abdomen: soft, positive BS, non-tender, non-distended, no  organomegaly, not an acute abdomen GU: not examined Neuro: CN II - XII grossly intact, Musculoskeletal: strength 5/5 all extremities. LE appears tight w/ fluid but no pitting Skin: no rash, no subcutaneous crepitation, no decubitus Psych:.........................Marland Kitchen   Labs on Admission:  Recent Labs    03/12/2023 1909 02/22/2023 1922 03/13/2023 1923  NA 126* 129* 129*  K 4.5 4.6 4.6  CL 91*  --  91*  CO2 23  --   --   GLUCOSE 154*  --  154*  BUN 10  --  11  CREATININE 1.05*  --  0.90  CALCIUM 8.9  --   --    Recent Labs    03/09/2023 1909  AST 24  ALT 15  ALKPHOS 72  BILITOT 0.2*  PROT 6.6  ALBUMIN 3.2*    Recent Labs    03/03/2023 1909 03/19/2023 1922 03/16/2023 1923  WBC 8.6  --   --   NEUTROABS 6.4  --   --   HGB 12.1 13.6 13.9  HCT 39.1 40.0 41.0  MCV 86.3  --   --   PLT 282  --   --     Micro Results: Recent Results (from the past 240 hour(s))  Resp panel by RT-PCR (RSV, Flu A&B, Covid) Anterior Nasal Swab     Status: None   Collection Time: 03/20/2023  7:24 PM   Specimen: Anterior Nasal Swab  Result Value Ref Range Status   SARS Coronavirus 2 by RT PCR NEGATIVE NEGATIVE Final   Influenza A by PCR NEGATIVE NEGATIVE Final   Influenza B by PCR NEGATIVE NEGATIVE Final    Comment: (NOTE) The Xpert Xpress SARS-CoV-2/FLU/RSV plus assay is intended as an aid in the diagnosis of influenza from Nasopharyngeal swab specimens and should not be used as a sole basis for treatment. Nasal washings and aspirates are unacceptable for Xpert  Xpress SARS-CoV-2/FLU/RSV testing.  Fact Sheet for Patients: BloggerCourse.com  Fact Sheet for Healthcare Providers: SeriousBroker.it  This test is not yet approved or cleared by the [place] FDA and has been authorized for detection and/or diagnosis of SARS-CoV-2 by FDA under an Emergency Use Authorization (EUA). This EUA will remain in effect (meaning this test can be used) for the  duration of the COVID-19 declaration under Section 564(b)(1) of the Act, 21 U.S.C. section 360bbb-3(b)(1), unless the authorization is terminated or revoked.     Resp Syncytial Virus by PCR NEGATIVE NEGATIVE Final    Comment: (NOTE) Fact Sheet for Patients: BloggerCourse.com  Fact Sheet for Healthcare Providers: SeriousBroker.it  This test is not yet approved or cleared by the [place] FDA and has been authorized for detection and/or diagnosis of SARS-CoV-2 by FDA under an Emergency Use Authorization (EUA). This EUA will remain in effect (meaning this test can be used) for the duration of the COVID-19 declaration under Section 564(b)(1) of the Act, 21 U.S.C. section 360bbb-3(b)(1), unless the authorization is terminated or revoked.  Performed at [facility], [address] [place], [place] [address]      Radiological Exams on Admission: DG Chest Port 1 View  Result Date: 03/05/2023 CLINICAL DATA:  Shortness of breath. EXAM: PORTABLE CHEST 1 VIEW COMPARISON:  Chest radiograph dated 10/18/2022 and CT dated 11/10/2022. FINDINGS: Right perihilar and right upper lobe opacities as seen on the prior CT and corresponding to post radiation changes. No new consolidative changes. There is no pleural effusion or pneumothorax. Stable cardiac silhouette. No acute osseous pathology. IMPRESSION: 1. No acute cardiopulmonary process. 2. 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 Electronically Signed   By: Elgie Collard M.D.   On: 03/04/2023 19:40    Assessment/Plan Present on Admission: . Acute respiratory failure with hypoxia -  Acute diastolic heart failure Acute exacerbation of COPD, mild -om more concerned re: aspiration in setting of n/v in pt w/ dementia  Hyponatremia -  Nausea and vomiting -  Concern re: aspiration -  Dementia without behavioral disturbance (HCC)/?MR -  . Essential hypertension -  .  Hyperlipidemia -  . Hypertension -  Chronic kidney disease -  type 2 diabetes mellitus (HCC) -  . Takotsubo cardiomyopathy -  Gotti Alwin 03/14/2023, 9:13 PM

## 2023-02-27 NOTE — ED Notes (Signed)
Patient left the floor in stable condition, with staff.

## 2023-02-27 NOTE — Assessment & Plan Note (Addendum)
Renal function with serum cr at 1,0 with K at 4,0 and serum bicarbonate at 23. Na 126 and mg 2,2   Plan to hold on diuresis for now. Continue supportive medical care Follow up renal function in am. Consult nutrition and liberate diet.

## 2023-02-27 NOTE — ED Notes (Signed)
Patient resting in bed, no s/s of distress, will continue to monitor.  

## 2023-02-27 NOTE — Progress Notes (Addendum)
Patient's latest vital signs are flagging YELLOW mews.  Patient has a low bp and increased HR (mainly due to increase coughing).  Informed provider on call at this time.  Dr. Briscoe Deutscher, MD is aware at this time.

## 2023-02-27 NOTE — Assessment & Plan Note (Signed)
Continue neuro checks per unit protocol. Today with no agitation.

## 2023-02-27 NOTE — ED Notes (Addendum)
Patient awake and alert this morning, no s/s of distress, took PO medications without difficulty, ordered patient a breakfast tray. Echo at bedside.

## 2023-02-27 NOTE — Progress Notes (Signed)
Heart Failure Navigator Progress Note  Assessed for Heart & Vascular TOC clinic readiness.  Patient does not meet criteria due to history of Dementia.   Navigator will sign off at this time.   Freddie Dymek, BSN, RN Heart Failure Nurse Navigator Secure Chat Only   

## 2023-02-27 NOTE — Assessment & Plan Note (Signed)
Hyperglycemia. Continue glucose cover and monitoring with insulin sliding scale. Continue with statin therapy.

## 2023-02-27 NOTE — Evaluation (Signed)
Clinical/Bedside Swallow Evaluation Patient Details  Name: [name] MRN: [id number] Date of Birth: 15-May-1946  Today's Date: 02/27/2023 Time: SLP Start Time (ACUTE ONLY): 0950 SLP Stop Time (ACUTE ONLY): 1010 SLP Time Calculation (min) (ACUTE ONLY): 20 min  Past Medical History:  Past Medical History:  Diagnosis Date   Acute renal failure (HCC)    Age-related nuclear cataract of both eyes 2016   +cortical age related cataracts OU    Anxiety and depression    Bilateral diabetic retinopathy (HCC) 2015   Dr. 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   Colonic diverticular abscess    COPD (chronic obstructive pulmonary disease) (HCC)    CVA (cerebral vascular accident) (HCC) 09/2016   MRI did show a left-sided small ischemic stroke which was acute but likely incidental (MRI was done b/c pt had TIA sx's in hospital).  Neuro put her on plavix at that time.  Cardiology d/c'd her plavix 08/2017.   Diabetes mellitus with complication (HCC)    diab retinopathy OU (laser)   Diverticulitis of large intestine with perforation and abscess 09/11/2016   Finger fracture, right 04/2020   R 5th metacarpal fx at the base (Dr. Dion Saucier)   GERD (gastroesophageal reflux disease)    protonix   History of concussion 08/25/2018   w/out loss of consciousness--mild increase in baseline memory impairment after.  CT head neg acute.   History of diverticulitis of colon    with abscess; required IR percutaneous drain placed 09/2016.   History of iron deficiency anemia    started iron 07/02/19.  EGD/colonoscopy 08/27/19 unrevealing. Capsule endoscopy considered but never done.   Hyperlipidemia    Hypertension    Left bundle branch block (LBBB) 12/16/2016   Lung cancer (HCC)    non-small cell lung ca, stage III in 05/2011;  systemic chemotherapy concurrent with radiation followed by prophylactic cranial irradiation and has been observation since July of 2010 with no evidence for disease recurrence-released from onc f/u 12/2014 (needs annual cxr by PCP).  CXR 08/2015 stable.  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 Shirline Frees did bx and this was NEG for malignancy.   Mild cognitive impairment with memory loss    Likely from brain radiation therapy   Non-obstructive CAD (coronary artery disease)    a. Cath 2006 preserved LV fxn, scattered irregularities without critical stenosis; b. 2008 stress echo negative for ischemia, but with hypertensive response; c. 08/2016 Cath: D1 25%, otw nl.   Normocytic anemia 2018   Iron, vit B12, folate all normal 12/2016.   Open-angle glaucoma 2016   Dr. Hanley Seamen, (bilateral)---responding to topical therapy   Osteoarthritis of both hands    Osteoporosis 03/2016; 03/2018   03/2016 DEXA T-score -2.2.  03/2018 T-score L femoral neck  -2.7--boniva started. DEXA 04/2021 T scor -2.6->boniva continued. Plan rpt DEXA 2 yrs.   Pneumonia    hospitalization 11/2016; hypoxemic resp failure--d/c'd home with home oxygen therapy   Pulmonary nodule, left 08/2021   LUL 11 mm ground glass, needs f/u CT 08/2022.   Seasonal allergic rhinitis    Subclinical hyperthyroidism 2018   Takotsubo cardiomyopathy    a. 08/2016 Echo: EF 30-35%, gr1DD, PASP ;  b. 08/2016 Cath: nonobs Dzs;  c. 11/2016 Echo: EF 40-45%, no rwma, nl RV fxn, mild TR, PASP .  ECHO 02/2017 EF 50-55%, grd II DD---essentially resolved Takotsubo 02/2017.   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 08/2016 in setting of diverticulitis & pneumoperitoneum and Takotsubo CM -->prolonged QT, seen by EP-->avoid meds with potential for QT prolongation.   Past Surgical History:  Past Surgical History:  Procedure Laterality Date   ABDOMINAL HYSTERECTOMY  1997   APPENDECTOMY     CARDIAC CATHETERIZATION N/A 09/14/2016   Procedure: Left Heart Cath and  Coronary Angiography;  Surgeon: Lennette Bihari, MD;  Location: [facility];  Service: Cardiovascular; Mild nonobstructive CAD with 85% smooth narrowing in the first diagonal branch of the LAD; 10% smooth narrowing of the ostial proximal left circumflex coronary artery; and a normal dominant RCA.   CARPAL TUNNEL RELEASE     CATARACT EXTRACTION Bilateral    CHOLECYSTECTOMY  2002   COLONOSCOPY  10/24/00; 08/27/19   2002 normal.  [employer] hemoccult testing via [facility] 06/18/15 was NEG.  2020->diverticulosis o/w nl.   dexa  03/2016; 03/2018   03/2018 T-score L femoral neck  -2.7 (worsened compared to osteopenic range 2017.) DEXA 05/03/21 stable T score -2.6 on boniva x 2 yrs->plan rpt DEXA 2 yrs.   ESOPHAGOGASTRODUODENOSCOPY  08/27/2019   Normal.   IR GENERIC HISTORICAL  09/19/2016   IR SINUS/FIST TUBE CHK-NON GI 09/19/2016 Gilmer Mor, DO [facility]   IR GENERIC HISTORICAL  10/06/2016   IR RADIOLOGIST EVAL & MGMT 10/06/2016 [facility]   IR GENERIC HISTORICAL  10/26/2016   IR RADIOLOGIST EVAL & MGMT 10/26/2016 [facility]   IR GENERIC HISTORICAL  11/03/2016   IR RADIOLOGIST EVAL & MGMT 11/03/2016 Gershon Crane, PA-C [facility]   IR GENERIC HISTORICAL  11/24/2016   IR RADIOLOGIST EVAL & MGMT 11/24/2016 Gershon Crane, PA-C [facility]   IR GENERIC HISTORICAL  12/05/2016   Fistula smaller/improving.  IR SINUS/FIST TUBE CHK-NON GI 12/05/2016 Simonne Come, MD [facility]   IR GENERIC HISTORICAL  12/22/2016   IR RADIOLOGIST EVAL & MGMT 12/22/2016 Berdine Dance, MD [facility]   IR RADIOLOGIST EVAL & MGMT  01/10/2017   IR RADIOLOGIST EVAL & MGMT  02/09/2017   IR SINUS/FIST TUBE CHK-NON GI  03/28/2017   LEFT HEART CATHETERIZATION WITH CORONARY ANGIOGRAM N/A 05/30/2012   Procedure: LEFT HEART CATHETERIZATION WITH CORONARY ANGIOGRAM;  Surgeon: Kathleene Hazel, MD;  Location: [facility];  Service: Cardiovascular: Mild, non-obstructive CAD   TRANSTHORACIC  ECHOCARDIOGRAM  09/14/2016; 11/2016   08/2016: EF 30-35 %, Akinesis of the mid-apicalanteroseptal myocardium.  Grade I DD.  Mild pulm HTN.  Repeat echo 11/2016, EF 40-45%, no rwma, nl RV fxn, mild TR, PASP .     TRANSTHORACIC ECHOCARDIOGRAM  03/07/2017   EF 50-55%. Hypokinesis of the distal septum with overall low normal LV,  systolic function; mild diastolic dysfunction with elevated LV  filling pressure; mildly calcified aortic valve with mild AI; small pericardial effusion   TRANSTHORACIC ECHOCARDIOGRAM     04/2022 EF 60-65%, no WMA, no valve probs   Zio patch  11/2022   3 day:  primarily sinus, avg HR 97, brief run SVT, rare PVC   HPI:  This is a 76-y/o female. She has a chronic cough since her lung cancer treatment/radiation.  Per patient she had nausea and vomiting for two days PTA. Her chronic cough has been increasing infrequency over the past 3 days.  The family increased her nebulizer treatment, given every 4 hours.  Patient was  not wheezing at the time.  Patient woke up sounding worse, with rattling in the chest and some wheezing.  She was given nebulizer treatment at 10, 2 and 6.  She had emesis around 5 PM, her respirations worsened.  911 was activated, she was taken to the ER.  There is no report of chest pains or worsening edema.  Patient not maintained on Lasix or home oxygen.     When EMS arrived, patient in respiratory distress, she was placed on CPAP, she vomited, CPAP was removed.   DDx: Acute diastolic heart failure, COPD exacerbation, aspiration pneumonia, pleural effusion  -Likely aspiration pneumonia in light of patient's nausea vomiting then respiratory distress per MD. w/ PMHx significant for Takotsubo syndrome, hypothyroidism, CHF, COPD, anxiety and depression, DM Type 2, dementia, HLD, HTN, h/o small cell lung cancer, and h/o CVA. BSE in 2018, dys 2 due to ill fitting dentures    Assessment / Plan / Recommendation  Clinical Impression  Pt seen with breakfast tray. Prior  to starting meal pt had excessive coughing. SLP sat pt up, she took 3 oz consecutively of water, no coughing. Then consumed the rest tof her meal and coffee, juice and milk with no coughing or signs of dysphagia. Pt does not have her dentures with her but is able to masticate soft foods without them. She denies any reflux or globus. Overall swallowing appears in tact and pt is enjoying PO intake. No further work up for swallowing recommeded. Continue diet as ordered. SLP Visit Diagnosis: Dysphagia, unspecified (R13.10)    Aspiration Risk  Mild aspiration risk    Diet Recommendation Regular;Thin liquid;Dysphagia 3 (Mech soft)   Liquid Administration via: Cup;Straw Medication Administration: Whole meds with liquid Supervision: Patient able to self feed Compensations: Slow rate;Small sips/bites Postural Changes: Seated upright at 90 degrees    Other  Recommendations Oral Care Recommendations: Oral care BID    Recommendations for follow up therapy are one component of a multi-disciplinary discharge planning process, led by the attending physician.  Recommendations may be updated based on patient status, additional functional criteria and insurance authorization.  Follow up Recommendations No SLP follow up      Assistance Recommended at Discharge    Functional Status Assessment    Frequency and Duration            Prognosis        Swallow Study   General HPI: This is a 76-y/o female. She has a chronic cough since her lung cancer treatment/radiation.  Per patient she was not feeling well yesterday she had nausea and vomiting yesterday and again today.  Her chronic cough has been increasing infrequency over the past 3 days.  The family increased her nebulizer treatment, given every 4 hours.  Patient was not wheezing at the time.  Today the patient woke up sounding worse, with rattling in the chest and some wheezing.  She was given nebulizer treatment at 10, 2 and 6.  She had emesis around  5 PM, her respirations worsened.  911 was activated, she was taken to the ER.  There is no report of chest pains or worsening edema.  Patient not maintained on Lasix or home oxygen.     When EMS arrived, patient in respiratory distress, she was placed on CPAP, she vomited, CPAP was removed.   DDx: Acute diastolic heart failure, COPD exacerbation, aspiration pneumonia, pleural effusion  -Likely aspiration pneumonia in light of patient's nausea vomiting then respiratory distress. w/ PMHx significant for Takotsubo  syndrome, hypothyroidism, CHF, COPD, anxiety and depression, DM Type 2, dementia, HLD, HTN, h/o small cell lung cancer, and h/o CVA. BSE in 2018, dys 2 due to ill fitting dentures Type of Study: Bedside Swallow Evaluation Previous Swallow Assessment: see HPI Diet Prior to this Study: Regular;Thin liquids (Level 0) Temperature Spikes Noted: No Respiratory Status: Nasal cannula History of Recent Intubation: No Behavior/Cognition: Alert;Cooperative;Pleasant mood Oral Cavity Assessment: Within Functional Limits Oral Care Completed by SLP: No Oral Cavity - Dentition: Edentulous;Dentures, not available Vision: Functional for self-feeding Self-Feeding Abilities: Able to feed self Patient Positioning: Upright in bed Baseline Vocal Quality: Normal Volitional Cough: Strong Volitional Swallow: Able to elicit    Oral/Motor/Sensory Function Overall Oral Motor/Sensory Function: Within functional limits   Ice Chips Ice chips: Not tested   Thin Liquid Thin Liquid: Within functional limits Presentation: Straw;Self Fed;Cup    Nectar Thick Nectar Thick Liquid: Not tested   Honey Thick Honey Thick Liquid: Not tested   Puree Puree: Within functional limits   Solid     Solid: Within functional limits Presentation: Spoon      Zamarah Ullmer, Riley Nearing 02/27/2023,10:49 AM

## 2023-02-28 DIAGNOSIS — E440 Moderate protein-calorie malnutrition: Secondary | ICD-10-CM

## 2023-02-28 DIAGNOSIS — I5181 Takotsubo syndrome: Secondary | ICD-10-CM | POA: Diagnosis not present

## 2023-02-28 DIAGNOSIS — I10 Essential (primary) hypertension: Secondary | ICD-10-CM | POA: Diagnosis not present

## 2023-02-28 DIAGNOSIS — J441 Chronic obstructive pulmonary disease with (acute) exacerbation: Secondary | ICD-10-CM | POA: Diagnosis not present

## 2023-02-28 DIAGNOSIS — E871 Hypo-osmolality and hyponatremia: Secondary | ICD-10-CM | POA: Diagnosis not present

## 2023-02-28 LAB — BASIC METABOLIC PANEL
Anion gap: 10 (ref 5–15)
BUN: 13 mg/dL (ref 8–23)
CO2: 26 mmol/L (ref 22–32)
Calcium: 8.4 mg/dL — ABNORMAL LOW (ref 8.9–10.3)
Chloride: 93 mmol/L — ABNORMAL LOW (ref 98–111)
Creatinine, Ser: 1.08 mg/dL — ABNORMAL HIGH (ref 0.44–1.00)
GFR, Estimated: 54 mL/min — ABNORMAL LOW (ref >60)
Glucose, Bld: 128 mg/dL — ABNORMAL HIGH (ref 70–99)
Potassium: 3.7 mmol/L (ref 3.5–5.1)
Sodium: 129 mmol/L — ABNORMAL LOW (ref 135–145)

## 2023-02-28 LAB — CBC
HCT: 34.1 % — ABNORMAL LOW (ref 36.0–46.0)
Hemoglobin: 10.8 g/dL — ABNORMAL LOW (ref 12.0–15.0)
MCH: 26.7 pg (ref 26.0–34.0)
MCHC: 31.7 g/dL (ref 30.0–36.0)
MCV: 84.4 fL (ref 80.0–100.0)
Platelets: 249 10*3/uL (ref 150–400)
RBC: 4.04 MIL/uL (ref 3.87–5.11)
RDW: 18.5 % — ABNORMAL HIGH (ref 11.5–15.5)
WBC: 12.2 10*3/uL — ABNORMAL HIGH (ref 4.0–10.5)
nRBC: 0 % (ref 0.0–0.2)

## 2023-02-28 LAB — GLUCOSE, CAPILLARY
Glucose-Capillary: 78 mg/dL (ref 70–99)
Glucose-Capillary: 80 mg/dL (ref 70–99)
Glucose-Capillary: 123 mg/dL — ABNORMAL HIGH (ref 70–99)
Glucose-Capillary: 65 mg/dL — ABNORMAL LOW (ref 70–99)
Glucose-Capillary: 105 mg/dL — ABNORMAL HIGH (ref 70–99)
Glucose-Capillary: 177 mg/dL — ABNORMAL HIGH (ref 70–99)
Glucose-Capillary: 204 mg/dL — ABNORMAL HIGH (ref 70–99)

## 2023-02-28 LAB — CULTURE, BLOOD (ROUTINE X 2): Special Requests: ADEQUATE

## 2023-02-28 MED ORDER — METHYLPREDNISOLONE SODIUM SUCC 40 MG IJ SOLR
40.0000 mg | INTRAMUSCULAR | Status: DC
  Administered 2023-02-28 – 2023-03-04 (×5): 40 mg via INTRAVENOUS
  Filled 2023-02-28 (×5): qty 1

## 2023-02-28 MED ORDER — ENSURE ENLIVE PO LIQD
237.0000 mL | Freq: Two times a day (BID) | ORAL | Status: DC
  Administered 2023-03-01 – 2023-03-03 (×4): 237 mL via ORAL

## 2023-02-28 MED ORDER — ADULT MULTIVITAMIN W/MINERALS CH
1.0000 | ORAL_TABLET | Freq: Every day | ORAL | Status: DC
  Administered 2023-03-01 – 2023-03-05 (×5): 1 via ORAL
  Filled 2023-02-28 (×5): qty 1

## 2023-02-28 NOTE — Inpatient Diabetes Management (Signed)
Inpatient Diabetes Program Recommendations  AACE/ADA: New Consensus Statement on Inpatient Glycemic Control (2015)  Target Ranges:  Prepandial:   less than 140 mg/dL      Peak postprandial:   less than 180 mg/dL (1-2 hours)      Critically ill patients:  140 - 180 mg/dL   Lab Results  Component Value Date   GLUCAP 105 (H) 02/28/2023   HGBA1C 6.5 (A) 02/17/2023   HGBA1C 6.5 02/17/2023   HGBA1C 6.5 (A) 02/17/2023   HGBA1C 6.5 02/17/2023    Review of Glycemic Control  Latest Reference Range & Units 02/27/23 07:43 02/27/23 12:48 02/27/23 17:11 02/27/23 21:50 02/28/23 03:04 02/28/23 05:34 02/28/23 08:12 02/28/23 12:12 02/28/23 13:37  Glucose-Capillary 70 - 99 mg/dL 098 (H) 119 (H) 147 (H) 103 (H) 78 80 123 (H) 65 (L) 105 (H)   Diabetes history: DM 2 Outpatient Diabetes medications: 70/30 4 units bid Current orders for Inpatient glycemic control:  Novolog 0-15 units Q4 hours PO prednisone 40 mg Daily  Inpatient Diabetes Program Recommendations:    Note: Hypoglycemia 65 at lunch after Novolog 2 units was given per sliding scale for glucose of 123.  -   consider reducing  Novolog Correction to sensitive 0-9 units tid + hs scale  Thanks,  Christena Deem RN, MSN, BC-ADM Inpatient Diabetes Coordinator Team Pager [phone] (8a-5p)

## 2023-02-28 NOTE — Plan of Care (Signed)

## 2023-02-28 NOTE — Progress Notes (Signed)
Progress Note   Patient: [name] ZOX:[id number] DOB: [birth year] DOA: 03/07/2023     2 DOS: the patient was seen and examined on 02/28/2023   Brief hospital course: Mrs. Teig was admitted to the hospital with the working diagnosis of acute hypoxemic respiratory failure due to COPD exacerbation.   77 yo female with the past medical history of non ischemic, stress induced cardiomyopathy, COPD, T2DM, hypertension, history of small cell lung cancer, history of CVA, depression and dementia who presented with dyspnea. Patient reported 3 days of worsening cough with increase sputum production, persistent despite use of nebulizer treatments at home. On the day of hospitalization she developed wheezing, and respiratory distress. EMS was called, patient was placed on Cpap and transported to the ED. Note that she vomited while on Cpap and then this device was removed. On her initial physical examination in the ED her blood pressure was 113/75, HR 113, RR 16 and 02 saturation 99%, her lungs had coarse breath sounds, and positive wheezing, heart with S1 and S2 present and rhythmic, abdomen with no distention and no lower extremity edema.   Na 126, K 4,5 CL 91, bicarbonate 23, glucose 154, bun 10 cr 1.0 BNP 889 High sensitive troponin 19  Wbc 8,6 hgb 12.1 plt 282  Sars covid 19 negative   Chest radiograph with hyperinflation, right upper love scarring, right sided pleural effusion.  CT chest with bilateral ground glass opacities, right upper lobe scarring. Right pleural effusion, interval increase in chronic pericardial effusion. Small increased right lower lobe anterior infrahilar consolidation. 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   EKG 111 bpm, normal axis, left bundle branch block, qtc less than 500, sinus rhythm with PAC with aberrancy, q wave V1 to V3, no significant ST segment or T wave changes.    Assessment and Plan: COPD with acute exacerbation (HCC) Acute  hypoxemic respiratory failure. Right lower lobe pneumonia.  Continue to have dyspnea and wheezing. Her 02 saturation today is 94 to 97% on 2 L/min per [place]   Plan to continue antibiotic therapy with Unasyn for possible aspiration.  Continue with bronchodilator therapy, and inhaled corticosteroids.  Out of bed to chair tid with meals. Airway clearing techniques with flutter valve and incentive spirometer.   Takotsubo cardiomyopathy Acute on chronic systolic heart failure.   Echocardiogram with reduced LV systolic function to 20%, no LVH, RV systolic function is preserved, small pericardial effusion, moderate MR, mild aortic stenosis.   At the time of my examination no signs of volume overload. Continue blood pressure monitoring Continue with oral furosemide   Hyponatremia Renal function today with serum Na up to 129, K is 3,7 and serum bicarbonate at 26. Cr is 1,0.   Continue diuresis with oral furosemide.  Continue supportive medical care Follow up renal function in am. Consult nutrition and liberate diet.   Essential hypertension Continue blood pressure monitoring   Dementia without behavioral disturbance (HCC) Continue neuro checks per unit protocol. Today with no agitation.   Type 2 diabetes mellitus with hyperlipidemia (HCC) Hyperglycemia. Continue glucose cover and monitoring with insulin sliding scale. Continue with statin therapy.   Malnutrition of moderate degree Continue with nutritional supplements.        Subjective: patient with persistent dyspnea and wheezing, very weak and deconditioned   Physical Exam: Vitals:   02/28/23 0754 02/28/23 1308 02/28/23 1322 02/28/23 1558  BP:    113/66  Pulse:  (!) 115  (!) 111  Resp:  (!) 24  18  Temp:    97.7 F (36.5 C)  TempSrc:    Oral  SpO2: 98% 95% 94% 92%  Weight:      Height:       Neurology awake and alert ENT with mild pallor Cardiovascular with S1 and S2 present and rhythmic with no gallops, rubs  or murmurs No JVD No lower extremity edema Respiratory with wheezing and rhonchi, no rales. Abdomen with no distention.  Data Reviewed:    Family Communication: no family at the bedside.   Disposition: Status is: Inpatient Remains inpatient appropriate because: respiratory failure   Planned Discharge Destination: Home     Author: Coralie Keens, MD 02/28/2023 5:08 PM  For on call review [email or web address].

## 2023-02-28 NOTE — Progress Notes (Signed)
Initial Nutrition Assessment  DOCUMENTATION CODES:   Non-severe (moderate) malnutrition in context of chronic illness  INTERVENTION:  Continue liberalized regular diet. Currently ordered for 1200 mL fluid restriction per MD. Pt edentulous but denies difficulty chewing/eating without dentures present and declines downgraded diet at this time. Monitor for need for downgraded diet if having difficulty with chewing.  Encouraged adequate intake of calories and protein at meals. Encouraged pt to order a source of protein at each meal and reviewed options available on menu.  Provide Ensure Enlive po BID, each supplement provides 350 kcal and 20 grams of protein.  Provide multivitamin with minerals po daily.  NUTRITION DIAGNOSIS:   Moderate Malnutrition related to chronic illness (COPD, HF) as evidenced by moderate fat depletion, moderate muscle depletion.  GOAL:   Patient will meet greater than or equal to 90% of their needs  MONITOR:   PO intake, Supplement acceptance, Labs, Weight trends, I & O's  REASON FOR ASSESSMENT:   Consult Assessment of nutrition requirement/status  ASSESSMENT:   77 year old female with PMHx of Takotsubo cardiomyopathy, orthostatic hypotension, CAD, chronic diastolic CHF, COPD, type 2 DM, history of small cell lung cancer, history of CVA, depression, dementia admitted with acute exacerbation of COPD, right lower lobe PNA, acute on chronic heart failure.  Met with pt at bedside. She reports her appetite has been decreased for about one month now but she is still eating well at meals. She reports living with her daughter and grandson at home. Her daughter does a lot of the grocery shopping and preparing meals. Pt reports she eats 3 meals per day. For breakfast she has cereal or a Poptart. For lunch she has leftovers. For dinner she has meat with sides. She reports she is eating fairly well here in the hospital. She reports eating most of her dinner last night. She  does report not eating as well at breakfast as she did not like food (documented as 25%). Pt denies food allergies. Noted lactose intolerance documented in chart. She reports she had some nausea yesterday that is now resolved. Denies abdominal pain. Denies difficulty with chewing or swallowing. RD noted pt edentulous on exam. She reports her dentures are at home but that she can cut up food and eat well without dentures, so declines downgraded diet at this time. Discussed importance of adequate intake of calories and protein. Encouraged pt to choose a good source of protein at meals. She is amenable to drinking Ensure to help meet calorie and protein needs. She reports she has had Ensure before and enjoys them.  Pt denies having any weight loss but reports she is unsure of her UBW. Per review of chart pt was 64.4 kg on 12/28/22. She is currently 61 kg. That is a weight loss of 3.4 kg or 5.3% weight over 2 months, which is not significant for time frame.  Medications reviewed and include: Lasix 40 mg daily, Novolog 0-15 units Q4hrs, pantoprazole, prednisone 40 mg daily, Unasyn  Labs reviewed: CBG 65-123, Sodium 129, Chloride 93, Creatinine 1.08  UOP: 1 occurrence unmeasured UOP in previous 24 hours  I/O: -1020 mL since admission  NUTRITION - FOCUSED PHYSICAL EXAM:  Flowsheet Row Most Recent Value  Orbital Region Severe depletion  Upper Arm Region Moderate depletion  Thoracic and Lumbar Region Moderate depletion  Buccal Region Moderate depletion  Temple Region Severe depletion  Clavicle Bone Region Moderate depletion  Clavicle and Acromion Bone Region Moderate depletion  Scapular Bone Region Moderate depletion  Dorsal  Hand Mild depletion  Patellar Region Moderate depletion  Anterior Thigh Region Moderate depletion  Posterior Calf Region Moderate depletion  Edema (RD Assessment) None  Hair Reviewed  Eyes Reviewed  Mouth Reviewed  [edentulous]  Skin Reviewed  Nails Reviewed       Diet  Order:   Diet Order             Diet regular Fluid consistency: Thin; Fluid restriction: 1200 mL Fluid  Diet effective now                  EDUCATION NEEDS:   Education needs have been addressed  Skin:  Skin Assessment: Reviewed RN Assessment  Last BM:  02/25/23  Height:   Ht Readings from Last 1 Encounters:  03/11/2023 5' (1.524 m)   Weight:   Wt Readings from Last 1 Encounters:  03/22/2023 61 kg   Ideal Body Weight:  45.5 kg  BMI:  Body mass index is 26.26 kg/m.  Estimated Nutritional Needs:   Kcal:  1600-1800  Protein:  80-90 grams  Fluid:  1.5-1.7 L/day or per MD  Letta Median, MS, RD, LDN, CNSC Pager number available on Amion

## 2023-02-28 NOTE — Assessment & Plan Note (Signed)
Continue with nutritional supplements.  

## 2023-03-01 DIAGNOSIS — I5181 Takotsubo syndrome: Secondary | ICD-10-CM | POA: Diagnosis not present

## 2023-03-01 DIAGNOSIS — I509 Heart failure, unspecified: Secondary | ICD-10-CM

## 2023-03-01 DIAGNOSIS — J441 Chronic obstructive pulmonary disease with (acute) exacerbation: Secondary | ICD-10-CM | POA: Diagnosis not present

## 2023-03-01 DIAGNOSIS — E871 Hypo-osmolality and hyponatremia: Secondary | ICD-10-CM | POA: Diagnosis not present

## 2023-03-01 LAB — CBC
HCT: 34.7 % — ABNORMAL LOW (ref 36.0–46.0)
Hemoglobin: 11.3 g/dL — ABNORMAL LOW (ref 12.0–15.0)
MCH: 26.9 pg (ref 26.0–34.0)
MCHC: 32.6 g/dL (ref 30.0–36.0)
MCV: 82.6 fL (ref 80.0–100.0)
Platelets: 233 10*3/uL (ref 150–400)
RBC: 4.2 MIL/uL (ref 3.87–5.11)
RDW: 18.6 % — ABNORMAL HIGH (ref 11.5–15.5)
WBC: 10.9 10*3/uL — ABNORMAL HIGH (ref 4.0–10.5)
nRBC: 0 % (ref 0.0–0.2)

## 2023-03-01 LAB — RESPIRATORY PANEL BY PCR

## 2023-03-01 LAB — COMPREHENSIVE METABOLIC PANEL
ALT: 16 U/L (ref 0–44)
AST: 23 U/L (ref 15–41)
Albumin: 2.7 g/dL — ABNORMAL LOW (ref 3.5–5.0)
Alkaline Phosphatase: 56 U/L (ref 38–126)
Anion gap: 12 (ref 5–15)
BUN: 14 mg/dL (ref 8–23)
CO2: 25 mmol/L (ref 22–32)
Calcium: 8.1 mg/dL — ABNORMAL LOW (ref 8.9–10.3)
Chloride: 94 mmol/L — ABNORMAL LOW (ref 98–111)
Creatinine, Ser: 1.01 mg/dL — ABNORMAL HIGH (ref 0.44–1.00)
GFR, Estimated: 58 mL/min — ABNORMAL LOW (ref >60)
Glucose, Bld: 124 mg/dL — ABNORMAL HIGH (ref 70–99)
Potassium: 4.2 mmol/L (ref 3.5–5.1)
Sodium: 131 mmol/L — ABNORMAL LOW (ref 135–145)
Total Bilirubin: 0.4 mg/dL (ref 0.3–1.2)
Total Protein: 5.9 g/dL — ABNORMAL LOW (ref 6.5–8.1)

## 2023-03-01 LAB — BASIC METABOLIC PANEL
Anion gap: 14 (ref 5–15)
BUN: 12 mg/dL (ref 8–23)
CO2: 24 mmol/L (ref 22–32)
Calcium: 8.4 mg/dL — ABNORMAL LOW (ref 8.9–10.3)
Chloride: 93 mmol/L — ABNORMAL LOW (ref 98–111)
Creatinine, Ser: 1.02 mg/dL — ABNORMAL HIGH (ref 0.44–1.00)
GFR, Estimated: 58 mL/min — ABNORMAL LOW (ref >60)
Glucose, Bld: 111 mg/dL — ABNORMAL HIGH (ref 70–99)
Potassium: 4.1 mmol/L (ref 3.5–5.1)
Sodium: 131 mmol/L — ABNORMAL LOW (ref 135–145)

## 2023-03-01 LAB — GLUCOSE, CAPILLARY
Glucose-Capillary: 118 mg/dL — ABNORMAL HIGH (ref 70–99)
Glucose-Capillary: 109 mg/dL — ABNORMAL HIGH (ref 70–99)
Glucose-Capillary: 137 mg/dL — ABNORMAL HIGH (ref 70–99)
Glucose-Capillary: 178 mg/dL — ABNORMAL HIGH (ref 70–99)
Glucose-Capillary: 186 mg/dL — ABNORMAL HIGH (ref 70–99)
Glucose-Capillary: 69 mg/dL — ABNORMAL LOW (ref 70–99)
Glucose-Capillary: 122 mg/dL — ABNORMAL HIGH (ref 70–99)

## 2023-03-01 LAB — MAGNESIUM: Magnesium: 2.3 mg/dL (ref 1.7–2.4)

## 2023-03-01 LAB — PHOSPHORUS: Phosphorus: 3.3 mg/dL (ref 2.5–4.6)

## 2023-03-01 MED ORDER — SODIUM CHLORIDE 3 % IN NEBU
4.0000 mL | INHALATION_SOLUTION | Freq: Two times a day (BID) | RESPIRATORY_TRACT | Status: AC
  Administered 2023-03-02 – 2023-03-04 (×5): 4 mL via RESPIRATORY_TRACT
  Filled 2023-03-01 (×6): qty 4

## 2023-03-01 MED ORDER — PANTOPRAZOLE SODIUM 40 MG PO TBEC
40.0000 mg | DELAYED_RELEASE_TABLET | Freq: Two times a day (BID) | ORAL | Status: DC
  Administered 2023-03-01 – 2023-03-05 (×8): 40 mg via ORAL
  Filled 2023-03-01 (×8): qty 1

## 2023-03-01 MED ORDER — DM-GUAIFENESIN ER 30-600 MG PO TB12
1.0000 | ORAL_TABLET | Freq: Two times a day (BID) | ORAL | Status: DC
  Administered 2023-03-01 – 2023-03-05 (×9): 1 via ORAL
  Filled 2023-03-01 (×9): qty 1

## 2023-03-01 NOTE — TOC Initial Note (Signed)
Transition of Care [facility]) - Initial/Assessment Note    Patient Details  Name: [name] MRN: [id number] Date of Birth: Aug 30, 1946  Transition of Care [facility]) CM/SW Contact:    Janae Bridgeman, RN Phone Number: 03/01/2023, 4:21 PM  Clinical Narrative:                 CM met with the patient at the bedside that is currently admitted for RLL pneumonia, IV antibiotics, nebulizers and current oxygen use in the hospital requiring [place] O2.  The patient lives at home with her daughter and son that provide 77 supervision.  DME at the home includes Glucometer, RW, RW with seat, nebulizer and bathroom scale at the home.  The patient has no home oxygen at this time.  Home health PT/OT orders placed to be co-signed by the attending physician.  The patient is not active with home health at this time.  Patient with history of home health with [facility].  I called Angie, CM with [facility] and I requested PT/OT.  When the patient is medically stable for discharge - patient should be transported home by car with family.  Expected Discharge Plan: Home w Home Health Services Barriers to Discharge: Continued Medical Work up   Patient Goals and CMS Choice Patient states their goals for this hospitalization and ongoing recovery are:: To return home CMS Medicare.gov Compare Post Acute Care list provided to:: Patient Choice offered to / list presented to : Patient [place] ownership interest in [facility].provided to:: Patient    Expected Discharge Plan and Services   Discharge Planning Services: CM Consult Post Acute Care Choice: Home Health Living arrangements for the past 2 months: Single Family Home                                      Prior Living Arrangements/Services Living arrangements for the past 2 months: Single Family Home Lives with:: Adult Children (Lives with daughter, Alfonse Ras and grandson, [place], 75 years old) Patient language and need for  interpreter reviewed:: Yes Do you feel safe going back to the place where you live?: Yes      Need for Family Participation in Patient Care: Yes (Comment) Care giver support system in place?: Yes (comment) Current home services: DME (DMe at the home includes nebulizer, scale, glucometer, RW with seat) Criminal Activity/Legal Involvement Pertinent to Current Situation/Hospitalization: No - Comment as needed  Activities of Daily Living Home Assistive Devices/Equipment: Walker (specify type) ADL Screening (condition at time of admission) Patient's cognitive ability adequate to safely complete daily activities?: Yes Is the patient deaf or have difficulty hearing?: No Does the patient have difficulty seeing, even when wearing glasses/contacts?: No Does the patient have difficulty concentrating, remembering, or making decisions?: No Patient able to express need for assistance with ADLs?: Yes Does the patient have difficulty dressing or bathing?: Yes Independently performs ADLs?: No Communication: Needs assistance Is this a change from baseline?: Pre-admission baseline Dressing (OT): Needs assistance Is this a change from baseline?: Pre-admission baseline Grooming: Needs assistance Is this a change from baseline?: Pre-admission baseline Feeding: Independent Bathing: Needs assistance Is this a change from baseline?: Pre-admission baseline Toileting: Needs assistance Is this a change from baseline?: Pre-admission baseline In/Out Bed: Needs assistance Is this a change from baseline?: Pre-admission baseline Walks in Home: Needs assistance Is this a change from baseline?: Pre-admission baseline Does the patient have difficulty walking  or climbing stairs?: Yes Weakness of Legs: Both Weakness of Arms/Hands: Both  Permission Sought/Granted Permission sought to share information with : Case Manager, Family Supports, Oceanographer granted to share information with :  Yes, Verbal Permission Granted        Permission granted to share info w Relationship: daughter, Alfonse Ras     Emotional Assessment Appearance:: Appears stated age Attitude/Demeanor/Rapport: Gracious Affect (typically observed): Accepting Orientation: : Oriented to Self, Oriented to Place, Oriented to  Time, Oriented to Situation Alcohol / Substance Use: Not Applicable (Quit smoking) Psych Involvement: No (comment)  Admission diagnosis:  Chronic obstructive pulmonary disease with acute exacerbation (HCC) [J44.1] Acute respiratory failure with hypoxemia (HCC) [J96.01] Acute on chronic congestive heart failure, unspecified heart failure type (HCC) [I50.9] Patient Active Problem List   Diagnosis Date Noted   Malnutrition of moderate degree 02/28/2023   Type 2 diabetes mellitus with hyperlipidemia (HCC) 02/27/2023   Acute on chronic diastolic CHF (congestive heart failure) (HCC) 03/22/2023   Acute respiratory failure with hypoxia (HCC) 02/27/2023   COPD with acute exacerbation (HCC) 03/22/2023   Acute respiratory failure with hypoxemia (HCC) 03/01/2023   Sinus tachycardia by electrocardiogram 11/19/2022   Orthostatic hypotension 05/22/2022   Chest pain 05/21/2022   Dehydration 10/19/2021   COVID-19 virus infection 10/19/2021   Hyponatremia 10/19/2021   Dementia without behavioral disturbance (HCC) 10/19/2021   Pyuria 10/19/2021   Fall    Recurrent falls 10/08/2019   Hypocalcemia 10/08/2019   Preoperative cardiovascular examination 01/12/2017   Diarrhea 12/02/2016   Essential hypertension 12/01/2016   Chronic diastolic (congestive) heart failure (HCC) 12/01/2016   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 [place] [address] Phone: [phone] Fax: [phone]  [facility] [address] [place] [address] Phone: [phone] Fax: [phone]     Social Determinants of Health (SDOH) Social History: SDOH Screenings   Food Insecurity: Patient Declined (02/27/2023)  Housing: Low Risk  (03/05/2023)  Transportation Needs: No Transportation Needs (03/10/2023)  Utilities: Not At Risk (03/07/2023)  Alcohol Screen: Low Risk  (12/16/2020)  Depression (PHQ2-9): Low Risk  (12/28/2022)  Financial Resource Strain: Low Risk  (01/10/2023)  Physical Activity: Unknown (01/10/2023)  Recent Concern: Physical Activity - Inactive (12/28/2022)  Social Connections: Moderately Isolated (01/10/2023)  Stress: Patient Declined (01/10/2023)  Tobacco Use: Medium Risk (02/17/2023)   SDOH Interventions:     Readmission Risk Interventions    03/01/2023    4:18 PM 08/11/2022    3:13 PM  Readmission Risk Prevention Plan  Transportation Screening Complete Complete  PCP or Specialist Appt within 5-7 Days  Complete  PCP or Specialist Appt within 3-5 Days Complete   Home Care Screening  Complete  Medication Review (RN CM)  Complete  HRI or Home Care Consult Complete   Social Work Consult for Recovery Care Planning/Counseling Complete   Palliative Care Screening Complete   Medication Review Oceanographer) Complete

## 2023-03-01 NOTE — Plan of Care (Signed)

## 2023-03-01 NOTE — Progress Notes (Signed)
[name] LOV:[id number] DOB: 1946/06/22 DOA:  PCP: [name], MD   Subj:   77 yo WF PMHx depression and dementia, Non ischemic, stress induced cardiomyopathy, HTN, COPD, DM type II,,  small cell lung cancer s/p XRT, Hx of CVA,    Presented with dyspnea. Patient reported 3 days of worsening cough with increase sputum production, persistent despite use of nebulizer treatments at home. On the day of hospitalization she developed wheezing, and respiratory distress. EMS was called, patient was placed on Cpap and transported to the ED. Note that she vomited while on Cpap and then this device was removed. On her initial physical examination in the ED her blood pressure was 113/75, HR 113, RR 16 and 02 saturation 99%, her lungs had coarse breath sounds, and positive wheezing, heart with S1 and S2 present and rhythmic, abdomen with no distention and no lower extremity edema.    Na 126, K 4,5 CL 91, bicarbonate 23, glucose 154, bun 10 cr 1.0 BNP 889 High sensitive troponin 19  Wbc 8,6 hgb 12.1 plt 282  Sars covid 19 negative    CXR with hyperinflation, right upper love scarring, right sided pleural effusion.   CT chest with bilateral ground glass opacities, right upper lobe scarring. Right pleural effusion, interval increase in chronic pericardial effusion. Small increased right lower lobe anterior infrahilar consolidation. 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    EKG 111 bpm, normal axis, left bundle branch block, qtc less than 500, sinus rhythm with PAC with aberrancy, q wave V1 to V3, no significant ST segment or T wave changes.    Obj: A/O x 4, states not on home O2.  States only time she ambulates is with the help of her grandson.   Objective: VITAL SIGNS: Temp: 98 F (36.7 C) (05/08 0739) Temp Source: Oral (05/08 0503) BP: 142/101 (05/08 0739) Pulse Rate: 98 (05/08 0739)   VENTILATOR SETTINGS: Nasal cannula 5/8, Flow 2 L/min SpO2=  98%  Procedures/Significant Events: 5/6 CT chest W contrast 1. New moderate right and small left layering pleural effusions, interval increase in chronic pericardial effusion also. No findings of acute pulmonary edema. 2. Small increased right lower lobe anterior infrahilar consolidation which could be atelectasis or a small pneumonia. 3. Small new areas of ground-glass disease in the apex of the left upper lobe which are probably inflammatory/infectious. 4. Diffusely thickened bronchi. 5. Slightly prominent single left mid hilar lymph node is likely reactive. 6. 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 7. Aortic and coronary artery atherosclerosis. 8. Prominent left ventricular cavity, which may be seen with cardiomyopathy and prior ischemic disease. 9. Chronic soft tissue thickening in the subcarinal space and along the right mid to upper hilum which were seen previously and also compatible with XRT.    Consultants:     Cultures   Antimicrobials: Anti-infectives (From admission, onward)    Start     Ordered Stop   02/27/23 0015  Ampicillin-Sulbactam (UNASYN) 3 g in sodium chloride 0.9 % 100 mL IVPB        02/27/23 0004           Exam: Physical Exam:  General: A/O x 4, positive  acute respiratory distress Eyes: negative scleral hemorrhage, negative anisocoria, negative icterus ENT: Negative Runny nose, negative gingival bleeding, Neck:  Negative scars, masses, torticollis, lymphadenopathy, JVD Lungs: positive mild diffuse expiratory wheeze, positive mild rhonchi right lung fields.  Negative crackles  Cardiovascular: Regular rate and rhythm without murmur gallop or  rub normal S1 and S2 Abdomen: negative abdominal pain, nondistended, positive soft, bowel sounds, no rebound, no ascites, no appreciable mass Extremities: No significant cyanosis, clubbing, or edema bilateral lower extremities Skin: Negative rashes, lesions, ulcers Psychiatric:   Negative depression, negative anxiety, negative fatigue, negative mania  Central nervous system:  Cranial nerves II through XII intact, tongue/uvula midline, all extremities muscle strength 5/5, sensation intact throughout, negative dysarthria, negative expressive aphasia, negative receptive aphasia.   .   DVT prophylaxis: Lovenox  Code Status: Full Family Communication:  Status is: Inpatient    Dispo: The patient is from: Home              Anticipated d/c is to: SNF              Anticipated d/c date is: > 3 days              Patient currently is not medically stable to d/c.      Assessment & Plan: Covid vaccination;   Active Problems:   COPD with acute exacerbation (HCC)   Takotsubo cardiomyopathy   Hyponatremia   Essential hypertension   Dementia without behavioral disturbance (HCC)   Type 2 diabetes mellitus with hyperlipidemia (HCC)   Malnutrition of moderate degree  COPD with acute exacerbation (HCC)/acute respiratory failure with hypoxia -Continue current antibiotics to complete 7-day course - DuoNeb QID -Hypertonic saline nebs BID x 3 days -Flutter valve - Incentive spirometry - Solu-Medrol 40 mg daily - Mucinex DM BID  CAP Right lower lobe pneumonia/ Aspiration PNA. -5/8 respiratory virus panel pending -5/8 chronic RUL fibrosis, with atelectasis vs pneumonia.  See above -5/8 see COPD  Takotsubo cardiomyopathy/Acute on chronic systolic heart failure.  -Echocardiogram LVEF = 20%, no LVH, RV systolic function is preserved, small pericardial effusion, moderate MR, mild aortic stenosis.  New findings -Lasix 40 mg daily - 5/8 consult cardiology (CHF team) in AM. -5/8 strict in and out -1 L - 5/8 Daily weight    Hyponatremia Lab Results  Component Value Date   NA 131 (L) 03/01/2023   NA 131 (L) 03/01/2023   NA 129 (L) 02/28/2023   NA 126 (L) 02/27/2023   NA 129 (L) 03/17/2023  -Improving   Essential hypertension -See CHF   Dementia without  behavioral disturbance (HCC) -5/8 must be mild, A/O x 4 today.     Type 2 diabetes mellitus controlled with HLD (HCC) -4/26 hemoglobin A1c= 6.5 - Moderate SSI CBG (last 3)  Recent Labs    03/01/23 0300 03/01/23 0512 03/01/23 0738  GLUCAP 118* 109* 137*    HLD - 5/8 lipid panel pending - Lipitor 40 mg daily   Malnutrition of moderate degree -Continue with nutritional supplements.        Mobility Assessment (last 72 hours)     Mobility Assessment     Row Name 02/28/23 1130 02/27/23 1305 02/27/23 04:27:51 02/27/23 0325     Does patient have an order for bedrest or is patient medically unstable No - Continue assessment No - Continue assessment Yes- Bedfast (Level 1) - Complete --    What is the highest level of mobility based on the progressive mobility assessment? Level 5 (Walks with assist in room/hall) - Balance while stepping forward/back and can walk in room with assist - Complete Level 5 (Walks with assist in room/hall) - Balance while stepping forward/back and can walk in room with assist - Complete -- Level 5 (Walks with assist in room/hall) - Balance while stepping forward/back and  can walk in room with assist - Complete                  Time: 50 minutes         Care during the described time interval was provided by me .  I have reviewed this patient's available data, including medical history, events of note, physical examination, and all test results as part of my evaluation.

## 2023-03-01 NOTE — Care Management Important Message (Signed)
Important Message  Patient Details  Name: Melania Skillin MRN: [id number] Date of Birth: 1946-09-17   Medicare Important Message Given:  Yes     Dorena Bodo 03/01/2023, 2:34 PM

## 2023-03-02 ENCOUNTER — Inpatient Hospital Stay (HOSPITAL_COMMUNITY): Payer: Medicare Other

## 2023-03-02 DIAGNOSIS — I509 Heart failure, unspecified: Secondary | ICD-10-CM | POA: Diagnosis not present

## 2023-03-02 DIAGNOSIS — J441 Chronic obstructive pulmonary disease with (acute) exacerbation: Secondary | ICD-10-CM | POA: Diagnosis not present

## 2023-03-02 DIAGNOSIS — I5041 Acute combined systolic (congestive) and diastolic (congestive) heart failure: Secondary | ICD-10-CM | POA: Diagnosis not present

## 2023-03-02 DIAGNOSIS — I5181 Takotsubo syndrome: Secondary | ICD-10-CM | POA: Diagnosis not present

## 2023-03-02 DIAGNOSIS — E871 Hypo-osmolality and hyponatremia: Secondary | ICD-10-CM | POA: Diagnosis not present

## 2023-03-02 LAB — CBC WITH DIFFERENTIAL/PLATELET
Abs Immature Granulocytes: 0.08 10*3/uL — ABNORMAL HIGH (ref 0.00–0.07)
Basophils Absolute: 0 10*3/uL (ref 0.0–0.1)
Basophils Relative: 0 %
Eosinophils Absolute: 0 10*3/uL (ref 0.0–0.5)
Eosinophils Relative: 0 %
HCT: 33.7 % — ABNORMAL LOW (ref 36.0–46.0)
Hemoglobin: 10.5 g/dL — ABNORMAL LOW (ref 12.0–15.0)
Immature Granulocytes: 1 %
Lymphocytes Relative: 4 %
Lymphs Abs: 0.4 10*3/uL — ABNORMAL LOW (ref 0.7–4.0)
MCH: 26.4 pg (ref 26.0–34.0)
MCHC: 31.2 g/dL (ref 30.0–36.0)
MCV: 84.7 fL (ref 80.0–100.0)
Monocytes Absolute: 0.6 10*3/uL (ref 0.1–1.0)
Monocytes Relative: 6 %
Neutro Abs: 9.8 10*3/uL — ABNORMAL HIGH (ref 1.7–7.7)
Neutrophils Relative %: 89 %
Platelets: 225 10*3/uL (ref 150–400)
RBC: 3.98 MIL/uL (ref 3.87–5.11)
RDW: 18.6 % — ABNORMAL HIGH (ref 11.5–15.5)
WBC: 10.9 10*3/uL — ABNORMAL HIGH (ref 4.0–10.5)
nRBC: 0 % (ref 0.0–0.2)

## 2023-03-02 LAB — COMPREHENSIVE METABOLIC PANEL
ALT: 18 U/L (ref 0–44)
AST: 24 U/L (ref 15–41)
Albumin: 2.7 g/dL — ABNORMAL LOW (ref 3.5–5.0)
Alkaline Phosphatase: 62 U/L (ref 38–126)
Anion gap: 8 (ref 5–15)
BUN: 20 mg/dL (ref 8–23)
CO2: 26 mmol/L (ref 22–32)
Calcium: 8.2 mg/dL — ABNORMAL LOW (ref 8.9–10.3)
Chloride: 97 mmol/L — ABNORMAL LOW (ref 98–111)
Creatinine, Ser: 0.95 mg/dL (ref 0.44–1.00)
GFR, Estimated: >60 mL/min (ref >60)
Glucose, Bld: 167 mg/dL — ABNORMAL HIGH (ref 70–99)
Potassium: 4.2 mmol/L (ref 3.5–5.1)
Sodium: 131 mmol/L — ABNORMAL LOW (ref 135–145)
Total Bilirubin: 0.6 mg/dL (ref 0.3–1.2)
Total Protein: 5.8 g/dL — ABNORMAL LOW (ref 6.5–8.1)

## 2023-03-02 LAB — CULTURE, BLOOD (ROUTINE X 2): Culture: NO GROWTH

## 2023-03-02 LAB — GLUCOSE, CAPILLARY
Glucose-Capillary: 184 mg/dL — ABNORMAL HIGH (ref 70–99)
Glucose-Capillary: 142 mg/dL — ABNORMAL HIGH (ref 70–99)
Glucose-Capillary: 170 mg/dL — ABNORMAL HIGH (ref 70–99)
Glucose-Capillary: 264 mg/dL — ABNORMAL HIGH (ref 70–99)
Glucose-Capillary: 197 mg/dL — ABNORMAL HIGH (ref 70–99)
Glucose-Capillary: 180 mg/dL — ABNORMAL HIGH (ref 70–99)
Glucose-Capillary: 164 mg/dL — ABNORMAL HIGH (ref 70–99)

## 2023-03-02 LAB — LIPID PANEL
Cholesterol: 125 mg/dL (ref 0–200)
HDL: 62 mg/dL (ref >40)
LDL Cholesterol: 54 mg/dL (ref 0–99)
Total CHOL/HDL Ratio: 2 RATIO
Triglycerides: 45 mg/dL (ref <150)
VLDL: 9 mg/dL (ref 0–40)

## 2023-03-02 LAB — MAGNESIUM: Magnesium: 2.3 mg/dL (ref 1.7–2.4)

## 2023-03-02 LAB — PHOSPHORUS: Phosphorus: 2.7 mg/dL (ref 2.5–4.6)

## 2023-03-02 MED ORDER — FUROSEMIDE 10 MG/ML IJ SOLN
40.0000 mg | Freq: Two times a day (BID) | INTRAMUSCULAR | Status: DC
  Administered 2023-03-02 – 2023-03-03 (×2): 40 mg via INTRAVENOUS
  Filled 2023-03-02 (×2): qty 4

## 2023-03-02 MED ORDER — ALBUMIN HUMAN 25 % IV SOLN
50.0000 g | Freq: Once | INTRAVENOUS | Status: AC
  Administered 2023-03-02: 50 g via INTRAVENOUS
  Filled 2023-03-02: qty 200

## 2023-03-02 NOTE — Progress Notes (Signed)
[name] ZOX:[id number] DOB: [birth year] DOA: 03/04/2023 PCP: [name], MD   Subj:   77 yo WF PMHx depression and dementia, Non ischemic, stress induced cardiomyopathy, HTN, COPD, DM type II,,  small cell lung cancer s/p XRT, Hx of CVA,    Presented with dyspnea. Patient reported 3 days of worsening cough with increase sputum production, persistent despite use of nebulizer treatments at home. On the day of hospitalization she developed wheezing, and respiratory distress. EMS was called, patient was placed on Cpap and transported to the ED. Note that she vomited while on Cpap and then this device was removed. On her initial physical examination in the ED her blood pressure was 113/75, HR 113, RR 16 and 02 saturation 99%, her lungs had coarse breath sounds, and positive wheezing, heart with S1 and S2 present and rhythmic, abdomen with no distention and no lower extremity edema.    Na 126, K 4,5 CL 91, bicarbonate 23, glucose 154, bun 10 cr 1.0 BNP 889 High sensitive troponin 19  Wbc 8,6 hgb 12.1 plt 282  Sars covid 19 negative    CXR with hyperinflation, right upper love scarring, right sided pleural effusion.   CT chest with bilateral ground glass opacities, right upper lobe scarring. Right pleural effusion, interval increase in chronic pericardial effusion. Small increased right lower lobe anterior infrahilar consolidation. 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    EKG 111 bpm, normal axis, left bundle branch block, qtc less than 500, sinus rhythm with PAC with aberrancy, q wave V1 to V3, no significant ST segment or T wave changes.    Obj: 5/9 A/O x 4, positive SOB.  Negative CP    Objective: VITAL SIGNS: Temp: 97.6 F (36.4 C) (05/09 0759) Temp Source: Oral (05/09 0436) BP: 107/55 (05/09 0759) Pulse Rate: 109 (05/09 0759)   VENTILATOR SETTINGS: Nasal cannula 5/9, Flow 2 L/min SpO2= 94%  Procedures/Significant Events: 5/6 CT chest W  contrast 1. New moderate right and small left layering pleural effusions, interval increase in chronic pericardial effusion also. No findings of acute pulmonary edema. 2. Small increased right lower lobe anterior infrahilar consolidation which could be atelectasis or a small pneumonia. 3. Small new areas of ground-glass disease in the apex of the left upper lobe which are probably inflammatory/infectious. 4. Diffusely thickened bronchi. 5. Slightly prominent single left mid hilar lymph node is likely reactive. 6. 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 7. Aortic and coronary artery atherosclerosis. 8. Prominent left ventricular cavity, which may be seen with cardiomyopathy and prior ischemic disease. 9. Chronic soft tissue thickening in the subcarinal space and along the right mid to upper hilum which were seen previously and also compatible with XRT.    Consultants:     Cultures   Antimicrobials: Anti-infectives (From admission, onward)    Start     Ordered Stop   02/27/23 0015  Ampicillin-Sulbactam (UNASYN) 3 g in sodium chloride 0.9 % 100 mL IVPB        02/27/23 0004          Physical Exam:  General: A/O x 4 , positive acute respiratory distress Eyes: negative scleral hemorrhage, negative anisocoria, negative icterus ENT: Negative Runny nose, negative gingival bleeding, Neck:  Negative scars, masses, torticollis, lymphadenopathy, JVD Lungs: decreased breath sounds bilateral, positive rhonchi bilateral, positive mild expiratory wheezes. Cardiovascular: Regular rate and rhythm without murmur gallop or rub normal S1 and S2 Abdomen: negative abdominal pain, nondistended, positive soft, bowel sounds, no rebound, no  ascites, no appreciable mass Extremities: No significant cyanosis, clubbing, or edema bilateral lower extremities Skin: Negative rashes, lesions, ulcers Psychiatric:  Negative depression, negative anxiety, negative fatigue, negative mania   Central nervous system:  Cranial nerves II through XII intact, tongue/uvula midline, all extremities muscle strength 5/5, sensation intact throughout, fnegative dysarthria, negative expressive aphasia, negative receptive aphasia.   .   DVT prophylaxis: Lovenox  Code Status: Full Family Communication:  Status is: Inpatient    Dispo: The patient is from: Home              Anticipated d/c is to: SNF              Anticipated d/c date is: > 3 days              Patient currently is not medically stable to d/c.      Assessment & Plan: Covid vaccination;   Active Problems:   COPD with acute exacerbation (HCC)   Takotsubo cardiomyopathy   Hyponatremia   Essential hypertension   Dementia without behavioral disturbance (HCC)   Type 2 diabetes mellitus with hyperlipidemia (HCC)   Malnutrition of moderate degree  COPD with acute exacerbation (HCC)/acute respiratory failure with hypoxia -Continue current antibiotics to complete 7-day course - DuoNeb QID -Hypertonic saline nebs BID x 3 days -Flutter valve - Incentive spirometry - Solu-Medrol 40 mg daily - Mucinex DM BID  CAP Right lower lobe pneumonia/ Aspiration PNA. -5/6 passed swallow evaluation by speech. -5/8 respiratory virus panel pending -5/8 chronic RUL fibrosis, with atelectasis vs pneumonia.  See above -5/8 see COPD -5/9 PCXR consistent with pulmonary edema  Takotsubo cardiomyopathy/Acute on chronic systolic heart failure.  -Echocardiogram LVEF = 20%, no LVH, RV systolic function is preserved, small pericardial effusion, moderate MR, mild aortic stenosis.  New findings.  Last echocardiogram 05/22/2022 LVEF=60 to 65%  -Dr. Bryan Lemma is patient's cardiologist last seen 4/13 -5/8 strict in and out - - 5/8 Daily weight Filed Weights   03/18/2023 1910  Weight: 61 kg  -5/9 consult cardiology, they will see patient today will await recommendations. - 5/9 patient continues SOB, sounds wet increase Lasix IV 40 mg  BID -5/9 Albumin 50 g prior to Lasix   Hyponatremia Lab Results  Component Value Date   NA 131 (L) 03/01/2023   NA 131 (L) 03/01/2023   NA 129 (L) 02/28/2023   NA 126 (L) 02/27/2023   NA 129 (L) 03/04/2023  -Improving   Essential hypertension -See CHF   Dementia without behavioral disturbance (HCC) -5/8 must be mild, A/O x 4 today.     Type 2 diabetes mellitus controlled with HLD (HCC) -4/26 hemoglobin A1c= 6.5 - Moderate SSI CBG (last 3)  Recent Labs    03/02/23 0219 03/02/23 0552 03/02/23 0811  GLUCAP 184* 142* 170*     HLD - 5/9 lipid panel; LDL= 54.  Within goal - Lipitor 40 mg daily   Malnutrition of moderate degree -Continue with nutritional supplements.        Mobility Assessment (last 72 hours)     Mobility Assessment     Row Name 03/01/23 2230 03/01/23 1030 02/28/23 1130 02/27/23 1305     Does patient have an order for bedrest or is patient medically unstable No - Continue assessment No - Continue assessment No - Continue assessment No - Continue assessment    What is the highest level of mobility based on the progressive mobility assessment? Level 5 (Walks with assist in room/hall) - Balance while  stepping forward/back and can walk in room with assist - Complete Level 5 (Walks with assist in room/hall) - Balance while stepping forward/back and can walk in room with assist - Complete Level 5 (Walks with assist in room/hall) - Balance while stepping forward/back and can walk in room with assist - Complete Level 5 (Walks with assist in room/hall) - Balance while stepping forward/back and can walk in room with assist - Complete                  Time: 50 minutes         Care during the described time interval was provided by me .  I have reviewed this patient's available data, including medical history, events of note, physical examination, and all test results as part of my evaluation.

## 2023-03-02 NOTE — Progress Notes (Signed)
ReDS Vest / Clip - 03/02/23 1512       ReDS Vest / Clip   Station Marker A    Ruler Value 21    ReDS Value Range Moderate volume overload    ReDS Actual Value 37              Cayman Brogden, BSN, Scientist, clinical (histocompatibility and immunogenetics) Only

## 2023-03-02 NOTE — Consult Note (Signed)
Cardiology Consultation   Patient ID: [name] MRN: [id number]; DOB: [birth year]  Admit date: 03/09/2023 Date of Consult: 03/02/2023  PCP:  [name], MD   [facility] Providers Cardiologist:  Bryan Lemma, MD   {    Patient Profile:   [name] is a 77 y.o. 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 Joseph Art.  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  Echocardiogram obtained on 09/14/2016 showed EF 30 to 35%, grade 1 DD, akinesis of the mid apical anteroseptal myocardium, PA pressure 30 mmHg.  Subsequent cardiac catheterization performed on 09/14/2016 revealed trivial MR, 10% ostial left circumflex lesion, 25% disease in D1, severe LV dysfunction with more pronounced mid and distal anterolateral, apical and distal inferior wall suggestive of Takotsubo cardiomyopathy.  Her ejection fraction improved to 40 to 45% by February 2018 and 50 to 55% by May 2018.  Last echocardiogram was obtained on 05/22/2022 which showed her EF normalized at 60 to 65%, no significant valve issue.  Patient had recurrent urinary tract infection in December 2023 and was seen by Dr. Herbie Baltimore in January 2024 for sinus tachycardia.  3-day heart monitor demonstrated heart rate 78-133 bpm, average heart rate 94 bpm, single atrial run of 10 beats, isolated PAC and PVCs.  She  has a chronic cough.  She was sent to [facility] on 02/24/2023 due to increased frequency of cough and wheezing.  She was initially placed on CPAP, however had vomiting, therefore CPAP was transitioned to nasal cannula.  Her overall presentation was concerning for aspiration pneumonia. Influenza, RSV and COVID test negative.  Viral panel was positive for rhinovirus/enterovirus. CT of the chest obtained on 02/27/2023 demonstrated new moderate right and small left pleural effusion, small increase to right lower lobe infrahilar consolidation which could represent atelectasis versus pneumonia, small area of groundglass disease in the apex of the left upper lobe which are probably inflammatory versus infectious, diffusely thickened bronchi.  Patient was treated with antibiotic, bronchodilators and corticosteroids.  He had hyponatremia on admission.  He was found to be mildly volume overloaded as well and was given a single dose of IV Lasix before resuming oral Lasix from home.  Echocardiogram obtained on 02/27/2023 demonstrated EF 20%, global hypokinesis, septal lateral dyssynergy, grade 2 DD, normal RV, moderate MR, mild aortic stenosis and a small pericardial effusion.  Chest x-ray obtained on 03/02/2023 showed new heterogeneous opacities of the left hemithorax, concerning for infection or asymmetric pulmonary edema.  Cardiology service consulted for abnormal echocardiogram and heart failure.  Talking with the patient, she denies any recent chest discomfort.  She feels short of breath.  She has very poor memory and likely has underlying dementia.  She does not remember what year this is or who is the current president of [place].   Past Medical History:  Diagnosis Date   Acute renal failure (HCC)    Age-related nuclear cataract of both  eyes 2016   +cortical age related cataracts OU    Anxiety and depression    Bilateral diabetic retinopathy (HCC) 2015   Dr. 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   Colonic diverticular abscess    COPD (chronic obstructive pulmonary disease) (HCC)    CVA (cerebral vascular accident) (HCC) 09/2016   MRI did show a left-sided small ischemic stroke which was acute but likely incidental (MRI was done b/c pt had TIA sx's in hospital).  Neuro put her on plavix at that time.  Cardiology d/c'd her plavix 08/2017.   Diabetes mellitus with complication (HCC)    diab retinopathy OU (laser)   Diverticulitis of large intestine with perforation and abscess 09/11/2016   Finger fracture, right 04/2020   R 5th metacarpal fx at the base (Dr. Dion Saucier)   GERD (gastroesophageal reflux disease)    protonix   History of concussion 08/25/2018   w/out loss of consciousness--mild increase in baseline memory impairment after.  CT head neg acute.   History of diverticulitis of colon    with abscess; required IR percutaneous drain placed 09/2016.   History of iron deficiency anemia    started iron 07/02/19.  EGD/colonoscopy 08/27/19 unrevealing. Capsule endoscopy considered but never done.   Hyperlipidemia    Hypertension    Left bundle branch block (LBBB) 12/16/2016   Lung cancer (HCC)    non-small cell lung ca, stage III in 05/2011; systemic chemotherapy concurrent with radiation followed by prophylactic cranial irradiation and has been observation since July of 2010 with no evidence for disease recurrence-released from onc f/u 12/2014 (needs annual cxr by PCP).  CXR 08/2015 stable.  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 Shirline Frees did bx and this was NEG for malignancy.   Mild cognitive impairment with memory loss    Likely from brain radiation therapy   Non-obstructive CAD (coronary artery disease)    a. Cath 2006 preserved LV fxn, scattered irregularities without critical stenosis; b. 2008  stress echo negative for ischemia, but with hypertensive response; c. 08/2016 Cath: D1 25%, otw nl.   Normocytic anemia 2018   Iron, vit B12, folate all normal 12/2016.   Open-angle glaucoma 2016   Dr. Hanley Seamen, (bilateral)---responding to topical therapy   Osteoarthritis of both hands    Osteoporosis 03/2016; 03/2018   03/2016 DEXA T-score -2.2.  03/2018 T-score L femoral neck  -2.7--boniva started. DEXA 04/2021 T scor -2.6->boniva continued. Plan rpt DEXA 2 yrs.   Pneumonia    hospitalization 11/2016; hypoxemic resp failure--d/c'd home with home oxygen therapy   Pulmonary nodule, left 08/2021   LUL 11 mm ground glass, needs f/u CT 08/2022.   Seasonal allergic rhinitis    Subclinical hyperthyroidism 2018   Takotsubo cardiomyopathy    a. 08/2016 Echo: EF 30-35%, gr1DD, PASP ;  b. 08/2016 Cath: nonobs Dzs;  c. 11/2016 Echo: EF 40-45%, no rwma, nl RV fxn, mild TR, PASP .  ECHO 02/2017 EF 50-55%, grd II DD---essentially resolved Takotsubo 02/2017.   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 08/2016 in setting of diverticulitis & pneumoperitoneum and Takotsubo CM -->prolonged QT, seen by EP-->avoid meds with potential for QT prolongation.    Past Surgical History:  Procedure Laterality Date   ABDOMINAL HYSTERECTOMY  1997   APPENDECTOMY     CARDIAC  CATHETERIZATION N/A 09/14/2016   Procedure: Left Heart Cath and Coronary Angiography;  Surgeon: Lennette Bihari, MD;  Location: [facility] INVASIVE CV LAB;  Service: Cardiovascular; Mild nonobstructive CAD with 85% smooth narrowing in the first diagonal branch of the LAD; 10% smooth narrowing of the ostial proximal left circumflex coronary artery; and a normal dominant RCA.   CARPAL TUNNEL RELEASE     CATARACT EXTRACTION Bilateral    CHOLECYSTECTOMY  2002   COLONOSCOPY  10/24/00; 08/27/19   2002 normal.  [employer] hemoccult testing via [facility] 06/18/15 was NEG.  2020->diverticulosis o/w nl.   dexa  03/2016; 03/2018   03/2018 T-score L femoral neck  -2.7  (worsened compared to osteopenic range 2017.) DEXA 05/03/21 stable T score -2.6 on boniva x 2 yrs->plan rpt DEXA 2 yrs.   ESOPHAGOGASTRODUODENOSCOPY  08/27/2019   Normal.   IR GENERIC HISTORICAL  09/19/2016   IR SINUS/FIST TUBE CHK-NON GI 09/19/2016 Gilmer Mor, DO [facility]   IR GENERIC HISTORICAL  10/06/2016   IR RADIOLOGIST EVAL & MGMT 10/06/2016 [facility]   IR GENERIC HISTORICAL  10/26/2016   IR RADIOLOGIST EVAL & MGMT 10/26/2016 [facility]   IR GENERIC HISTORICAL  11/03/2016   IR RADIOLOGIST EVAL & MGMT 11/03/2016 Gershon Crane, PA-C [facility] INTER[facility]   IR GENERIC HISTORICAL  11/24/2016   IR RADIOLOGIST EVAL & MGMT 11/24/2016 Gershon Crane, PA-C [facility]   IR GENERIC HISTORICAL  12/05/2016   Fistula smaller/improving.  IR SINUS/FIST TUBE CHK-NON GI 12/05/2016 Simonne Come, MD [facility]   IR GENERIC HISTORICAL  12/22/2016   IR RADIOLOGIST EVAL & MGMT 12/22/2016 Berdine Dance, MD [facility]   IR RADIOLOGIST EVAL & MGMT  01/10/2017   IR RADIOLOGIST EVAL & MGMT  02/09/2017   IR SINUS/FIST TUBE CHK-NON GI  03/28/2017   LEFT HEART CATHETERIZATION WITH CORONARY ANGIOGRAM N/A 05/30/2012   Procedure: LEFT HEART CATHETERIZATION WITH CORONARY ANGIOGRAM;  Surgeon: Kathleene Hazel, MD;  Location: [facility] CATH LAB;  Service: Cardiovascular: Mild, non-obstructive CAD   TRANSTHORACIC ECHOCARDIOGRAM  09/14/2016; 11/2016   08/2016: EF 30-35 %, Akinesis of the mid-apicalanteroseptal myocardium.  Grade I DD.  Mild pulm HTN.  Repeat echo 11/2016, EF 40-45%, no rwma, nl RV fxn, mild TR, PASP .     TRANSTHORACIC ECHOCARDIOGRAM  03/07/2017   EF 50-55%. Hypokinesis of the distal septum with overall low normal LV,  systolic function; mild diastolic dysfunction with elevated LV  filling pressure; mildly calcified aortic valve with mild AI; small pericardial effusion   TRANSTHORACIC ECHOCARDIOGRAM     04/2022 EF 60-65%, no WMA, no valve probs   Zio patch  11/2022    3 day:  primarily sinus, avg HR 97, brief run SVT, rare PVC     Home Medications:  Prior to Admission medications   Medication Sig Start Date End Date Taking? Authorizing Provider  acetaminophen (TYLENOL) 500 MG tablet Take 1,000 mg by mouth daily as needed for headache.   Yes [provider]  albuterol (PROVENTIL) (2.5 MG/3ML) 0.083% nebulizer solution USE 1 VIAL IN NEBULIZER EVERY 4 HOURS AS NEEDED FOR WHEEZING AND FOR SHORTNESS OF BREATH Patient taking differently: Take 2.5 mg by nebulization every 4 (four) hours as needed for wheezing or shortness of breath. 02/12/21  Yes McGowen, Maryjean Morn, MD  albuterol (VENTOLIN HFA) 108 (90 Base) MCG/ACT inhaler INHALE 2 PUFFS BY MOUTH EVERY 6 HOURS AS NEEDED FOR WHEEZING OR SHORTNESS OF BREATH Patient taking differently: Inhale  2 puffs into the lungs every 4 (four) hours as needed for wheezing or shortness of breath. 06/05/20  Yes McGowen, Maryjean Morn, MD  aspirin 81 MG EC tablet Take 81 mg by mouth daily.    Yes [provider]  atorvastatin (LIPITOR) 40 MG tablet Take 1 tablet (40 mg total) by mouth daily. Patient taking differently: Take 40 mg by mouth at bedtime. 11/18/22  Yes McGowen, Maryjean Morn, MD  budesonide-formoterol [facility] 160-4.5 MCG/ACT inhaler Inhale 2 puffs into the lungs 2 (two) times daily. 09/02/22  Yes McGowen, Maryjean Morn, MD  buPROPion (WELLBUTRIN XL) 300 MG 24 hr tablet Take 1 tablet (300 mg total) by mouth daily. 11/18/22  Yes McGowen, Maryjean Morn, MD  Calcium Carb-Cholecalciferol (CALCIUM + VITAMIN D3 PO) Take 1-2 tablets by mouth See admin instructions. Take 1 tablet by mouth in the morning and 2 tablets by mouth at bedtime   Yes [provider]  cetirizine (ZYRTEC) 10 MG tablet Take 10 mg by mouth every evening.   Yes [provider]  ibandronate (BONIVA) 150 MG tablet Take 1 tablet (150 mg total) by mouth every 30 (thirty) days. Take in the morning with a full glass of water, on an empty stomach, and do  not take anything else by mouth or lie down for the next 30 min. Patient taking differently: Take 150 mg by mouth every 30 (thirty) days. 08/04/22  Yes McGowen, Maryjean Morn, MD  insulin isophane & regular human KwikPen (HUMULIN 70/30 KWIKPEN) (70-30) 100 UNIT/ML KwikPen 4 units SQ bid Patient taking differently: Inject 4 Units into the skin in the morning and at bedtime. 02/17/23  Yes McGowen, Maryjean Morn, MD  ketorolac (ACULAR) 0.5 % ophthalmic solution Place 2 drops into the left eye in the morning, at noon, and at bedtime. 11/01/22  Yes [provider]  Multiple Vitamins-Minerals (MULTIVITAMIN WOMEN PO) Take 1 tablet by mouth daily.   Yes [provider]  pantoprazole (PROTONIX) 40 MG tablet Take 1 tablet (40 mg total) by mouth 2 (two) times daily. 01/10/23  Yes McGowen, Maryjean Morn, MD  prednisoLONE acetate (PRED FORTE) 1 % ophthalmic suspension Place 2 drops into the left eye in the morning, at noon, and at bedtime. 11/17/22  Yes [provider]  Spacer/Aero-Holding Deretha Emory (AEROCHAMBER MV) inhaler Use as instructed 08/04/22   McGowen, Maryjean Morn, MD    Inpatient Medications: Scheduled Meds:  arformoterol  15 mcg Nebulization BID   aspirin EC  81 mg Oral Daily   atorvastatin  40 mg Oral Daily   buPROPion  300 mg Oral Daily   dextromethorphan-guaiFENesin  1 tablet Oral BID   enoxaparin (LOVENOX) injection  40 mg Subcutaneous Daily   feeding supplement  237 mL Oral BID BM   furosemide  40 mg Intravenous BID   insulin aspart  0-15 Units Subcutaneous Q4H   ipratropium-albuterol  3 mL Nebulization Q6H   methylPREDNISolone (SOLU-MEDROL) injection  40 mg Intravenous Q24H   multivitamin with minerals  1 tablet Oral Daily   pantoprazole  40 mg Oral BID   sodium chloride HYPERTONIC  4 mL Nebulization BID   Continuous Infusions:  albumin human     ampicillin-sulbactam (UNASYN) IV 3 g (03/02/23 0549)   PRN Meds: acetaminophen **OR** acetaminophen, albuterol, ondansetron **OR**  ondansetron (ZOFRAN) IV, senna-docusate  Allergies:    Allergies  Allergen Reactions   Lactose Intolerance (Gi) Diarrhea   Motrin [Ibuprofen] Itching, Anxiety and Other (See Comments)    Hyperventilates  Social History:   Social History   Socioeconomic History   Marital status: Widowed    Spouse name: Not on file   Number of children: Not on file   Years of education: Not on file   Highest education level: 12th grade  Occupational History   Occupation: Retired Scientist, clinical (histocompatibility and immunogenetics): UNEMPLOYED  Tobacco Use   Smoking status: Former    Packs/day: 1.00    Years: 45.00    Additional pack years: 0.00    Total pack years: 45.00    Types: Cigarettes    Quit date: 10/24/2008    Years since quitting: 14.3   Smokeless tobacco: Never  Vaping Use   Vaping Use: Never used  Substance and Sexual Activity   Alcohol use: No    Alcohol/week: 0.0 standard drinks of alcohol   Drug use: No   Sexual activity: Not on file  Other Topics Concern   Not on file  Social History Narrative   Widow, lives in [place] with her daughter Alfonse Ras.   Husband died 07/29/19 (esoph cancer, copd, chf).   No alcohol.   Social Determinants of Health   Financial Resource Strain: Low Risk  (01/10/2023)   Overall Financial Resource Strain (CARDIA)    Difficulty of Paying Living Expenses: Not hard at all  Food Insecurity: Patient Declined (02/27/2023)   Hunger Vital Sign    Worried About Running Out of Food in the Last Year: Patient declined    Ran Out of Food in the Last Year: Patient declined  Transportation Needs: No Transportation Needs (03/05/2023)   PRAPARE - Administrator, Civil Service (Medical): No    Lack of Transportation (Non-Medical): No  Physical Activity: Unknown (01/10/2023)   Exercise Vital Sign    Days of Exercise per Week: Patient declined    Minutes of Exercise per Session: 0 min  Recent Concern: Physical Activity - Inactive (12/28/2022)   Exercise Vital Sign     Days of Exercise per Week: 0 days    Minutes of Exercise per Session: 0 min  Stress: Patient Declined (01/10/2023)   [employer] of Occupational Health - Occupational Stress Questionnaire    Feeling of Stress : Patient declined  Social Connections: Moderately Isolated (01/10/2023)   Social Connection and Isolation Panel [NHANES]    Frequency of Communication with Friends and Family: Once a week    Frequency of Social Gatherings with Friends and Family: Once a week    Attends Religious Services: 1 to 4 times per year    Active Member of [employer] or Organizations: Yes    Attends Banker Meetings: 1 to 4 times per year    Marital Status: Widowed  Intimate Partner Violence: Not At Risk (03/21/2023)   Humiliation, Afraid, Rape, and Kick questionnaire    Fear of Current or Ex-Partner: No    Emotionally Abused: No    Physically Abused: No    Sexually Abused: No    Family History:    Family History  Problem Relation Age of Onset   Heart failure Mother    Kidney disease Mother        renal failure   Diabetes Mother    Diabetes Father    Diabetes Brother    Heart disease Brother    Heart attack Brother    Heart attack Brother    Heart attack Brother    Pancreatic cancer Brother    Colon cancer Neg Hx    Esophageal cancer  Neg Hx    Stomach cancer Neg Hx    Rectal cancer Neg Hx      ROS:  Please see the history of present illness.   All other ROS reviewed and negative.     Physical Exam/Data:   Vitals:   03/02/23 0436 03/02/23 0759 03/02/23 1145 03/02/23 1151  BP: (!) 117/55 (!) 107/55 123/84   Pulse: (!) 110 (!) 109 (!) 109   Resp:  19 19   Temp: (!) 97.5 F (36.4 C) 97.6 F (36.4 C) (!) 97.4 F (36.3 C)   TempSrc: Oral     SpO2: 92% 93% 98% 94%  Weight:      Height:        Intake/Output Summary (Last 24 hours) at 03/02/2023 1351 Last data filed at 03/02/2023 0200 Gross per 24 hour  Intake 474 ml  Output --  Net 474 ml      03/20/2023    7:10 PM  02/17/2023    1:13 PM 01/30/2023   10:51 AM  Last 3 Weights  Weight (lbs) 134 lb 7.7 oz 134 lb 9.6 oz 136 lb  Weight (kg) 61 kg 61.054 kg 61.689 kg     Body mass index is 26.26 kg/m.  General:  Well nourished, well developed, in no acute distress HEENT: normal Neck: no JVD Vascular: No carotid bruits; Distal pulses 2+ bilaterally Cardiac:  normal S1, S2; RRR; no murmur  Lungs:  clear to auscultation bilaterally, no wheezing, rhonchi or rales  Abd: soft, nontender, no hepatomegaly  Ext: no edema Musculoskeletal:  No deformities, BUE and BLE strength normal and equal Skin: warm and dry  Neuro:  CNs 2-12 intact, no focal abnormalities noted Psych:  Normal affect   EKG:  The EKG was personally reviewed and demonstrates: Sinus tachycardia, left bundle branch block. Telemetry:  Telemetry was personally reviewed and demonstrates: Sinus tachycardia, no significant ventricular ectopy.  Relevant CV Studies:  Echo 02/27/2023  1. Left ventricular ejection fraction, by estimation, is 20%. The left ventricle has severely decreased function. The left ventricle demonstrates regional wall motion abnormalities with global hypokinesis with relatively  preserved basal lateral and basal anterior wall motion. There was septal-lateral dyssynchrony consistent with IVCD. No LV thrombus noted. Left ventricular diastolic  parameters are consistent with Grade II diastolic dysfunction (pseudonormalization).   2. IVC not visualized. Right ventricular systolic function is normal. The right ventricular size is normal. Tricuspid regurgitation signal is inadequate for assessing PA pressure.   3. Left atrial size was mildly dilated.   4. The mitral valve is abnormal. Moderate mitral valve regurgitation. No evidence of mitral stenosis. Moderate mitral annular calcification.   5. The aortic valve is tricuspid. There is moderate calcification of the aortic valve. Aortic valve regurgitation is mild. Mild aortic valve  stenosis. Aortic valve area, by VTI measures 1.75 cm. Aortic valve mean gradient measures 14.0 mmHg.   6. A small pericardial effusion is present. The pericardial effusion is circumferential.   Laboratory Data:  High Sensitivity Troponin:   Recent Labs  Lab 02/25/2023 1909 02/27/2023 2114  TROPONINIHS 19* 17     Chemistry Recent Labs  Lab 02/27/23 0212 02/28/23 0629 03/01/23 0336 03/01/23 0825 03/02/23 0725  NA 126*   < > 131* 131* 131*  K 4.0   < > 4.1 4.2 4.2  CL 92*   < > 93* 94* 97*  CO2 23   < > 24 25 26   GLUCOSE 260*   < > 111* 124* 167*  BUN 9   < > 12 14 20   CREATININE 1.07*   < > 1.02* 1.01* 0.95  CALCIUM 8.2*   < > 8.4* 8.1* 8.2*  MG 2.2  --   --  2.3 2.3  GFRNONAA 55*   < > 58* 58* >60  ANIONGAP 11   < > 14 12 8    < > = values in this interval not displayed.    Recent Labs  Lab 03/18/2023 1909 03/01/23 0825 03/02/23 0725  PROT 6.6 5.9* 5.8*  ALBUMIN 3.2* 2.7* 2.7*  AST 24 23 24   ALT 15 16 18   ALKPHOS 72 56 62  BILITOT 0.2* 0.4 0.6   Lipids  Recent Labs  Lab 03/02/23 0725  CHOL 125  TRIG 45  HDL 62  LDLCALC 54  CHOLHDL 2.0    Hematology Recent Labs  Lab 02/28/23 0629 03/01/23 0336 03/02/23 0725  WBC 12.2* 10.9* 10.9*  RBC 4.04 4.20 3.98  HGB 10.8* 11.3* 10.5*  HCT 34.1* 34.7* 33.7*  MCV 84.4 82.6 84.7  MCH 26.7 26.9 26.4  MCHC 31.7 32.6 31.2  RDW 18.5* 18.6* 18.6*  PLT 249 233 225   Thyroid No results for input(s): "TSH", "FREET4" in the last 168 hours.  BNP Recent Labs  Lab 03/05/2023 1909  BNP 889.7*    DDimer No results for input(s): "DDIMER" in the last 168 hours.   Radiology/Studies:  ECHOCARDIOGRAM COMPLETE  Result Date: 02/27/2023    ECHOCARDIOGRAM REPORT   Patient Name:   RALEY KALLER Date of Exam: 02/27/2023 Medical Rec #:  [id number]           Height:       60.0 in Accession #:    [id number]          Weight:       134.5 lb Date of Birth:  19-Aug-1946           BSA:          1.577 m Patient Age:    76 years             BP:           97/56 mmHg Patient Gender: F                   HR:           98 bpm. Exam Location:  Inpatient Procedure: 2D Echo, Cardiac Doppler and Color Doppler Indications:    CHF  History:        Patient has prior history of Echocardiogram examinations.  Sonographer:    NA Referring Phys: [id number] DEBBY CROSLEY IMPRESSIONS  1. Left ventricular ejection fraction, by estimation, is 20%. The left ventricle has severely decreased function. The left ventricle demonstrates regional wall motion abnormalities with global hypokinesis with relatively preserved basal lateral and basal anterior wall motion. There was septal-lateral dyssynchrony consistent with IVCD. No LV thrombus noted. Left ventricular diastolic parameters are consistent with Grade II diastolic dysfunction (pseudonormalization).  2. IVC not visualized. Right ventricular systolic function is normal. The right ventricular size is normal. Tricuspid regurgitation signal is inadequate for assessing PA pressure.  3. Left atrial size was mildly dilated.  4. The mitral valve is abnormal. Moderate mitral valve regurgitation. No evidence of mitral stenosis. Moderate mitral annular calcification.  5. The aortic valve is tricuspid. There is moderate calcification of the aortic valve. Aortic valve regurgitation is mild. Mild aortic valve stenosis. Aortic valve area, by VTI measures 1.75 cm. Aortic valve  mean gradient measures 14.0 mmHg.  6. A small pericardial effusion is present. The pericardial effusion is circumferential. FINDINGS  Left Ventricle: Left ventricular ejection fraction, by estimation, is 20%. The left ventricle has severely decreased function. The left ventricle demonstrates regional wall motion abnormalities. The left ventricular internal cavity size was normal in size. There is no left ventricular hypertrophy. Left ventricular diastolic parameters are consistent with Grade II diastolic dysfunction (pseudonormalization). Right Ventricle: IVC not  visualized. The right ventricular size is normal. No increase in right ventricular wall thickness. Right ventricular systolic function is normal. Tricuspid regurgitation signal is inadequate for assessing PA pressure. Left Atrium: Left atrial size was mildly dilated. Right Atrium: Right atrial size was normal in size. Pericardium: A small pericardial effusion is present. The pericardial effusion is circumferential. Mitral Valve: The mitral valve is abnormal. Moderate mitral annular calcification. Moderate mitral valve regurgitation. No evidence of mitral valve stenosis. MV peak gradient, 12.0 mmHg. The mean mitral valve gradient is 5.0 mmHg. Tricuspid Valve: The tricuspid valve is normal in structure. Tricuspid valve regurgitation is not demonstrated. Aortic Valve: The aortic valve is tricuspid. There is moderate calcification of the aortic valve. Aortic valve regurgitation is mild. Mild aortic stenosis is present. Aortic valve mean gradient measures 14.0 mmHg. Aortic valve peak gradient measures 22.3  mmHg. Aortic valve area, by VTI measures 1.75 cm. Pulmonic Valve: The pulmonic valve was normal in structure. Pulmonic valve regurgitation is not visualized. Aorta: The aortic root is normal in size and structure. Venous: The inferior vena cava was not well visualized. IAS/Shunts: No atrial level shunt detected by color flow Doppler.  LEFT VENTRICLE PLAX 2D LVIDd:         4.80 cm      Diastology LVIDs:         4.50 cm      LV e' medial:    2.95 cm/s LV PW:         0.60 cm      LV E/e' medial:  57.1 LV IVS:        0.70 cm      LV e' lateral:   2.82 cm/s LVOT diam:     2.10 cm      LV E/e' lateral: 59.8 LV SV:         82 LV SV Index:   52 LVOT Area:     3.46 cm  LV Volumes (MOD) LV vol d, MOD A4C: 133.0 ml LV vol s, MOD A4C: 102.0 ml LV SV MOD A4C:     133.0 ml RIGHT VENTRICLE RV S prime:     8.73 cm/s TAPSE (M-mode): 1.1 cm LEFT ATRIUM             Index        RIGHT ATRIUM           Index LA diam:        3.30 cm 2.09  cm/m   RA Area:     10.20 cm LA Vol (A2C):   43.1 ml 27.33 ml/m  RA Volume:   19.40 ml  12.30 ml/m LA Vol (A4C):   47.0 ml 29.81 ml/m LA Biplane Vol: 45.0 ml 28.54 ml/m  AORTIC VALVE AV Area (Vmax):    1.95 cm AV Area (Vmean):   1.70 cm AV Area (VTI):     1.75 cm AV Vmax:           236.00 cm/s AV Vmean:          177.000  cm/s AV VTI:            0.466 m AV Peak Grad:      22.3 mmHg AV Mean Grad:      14.0 mmHg LVOT Vmax:         133.00 cm/s LVOT Vmean:        86.800 cm/s LVOT VTI:          0.236 m LVOT/AV VTI ratio: 0.51  AORTA Ao Root diam: 2.80 cm Ao Asc diam:  3.20 cm MITRAL VALVE MV Area (PHT): 5.62 cm       SHUNTS MV Area VTI:   2.54 cm       Systemic VTI:  0.24 m MV Peak grad:  12.0 mmHg      Systemic Diam: 2.10 cm MV Mean grad:  5.0 mmHg MV Vmax:       1.73 m/s MV Vmean:      99.6 cm/s MV Decel Time: 135 msec MR Peak grad:    77.8 mmHg MR Mean grad:    50.0 mmHg MR Vmax:         441.00 cm/s MR Vmean:        335.0 cm/s MR PISA:         0.39 cm MR PISA Eff ROA: 3 mm MR PISA Radius:  0.25 cm MV E velocity: 168.50 cm/s Dalton McleanMD Electronically signed by Wilfred Lacy Signature Date/Time: 02/27/2023/9:58:45 AM    Final    CT CHEST W CONTRAST  Result Date: 02/27/2023 CLINICAL DATA:  COPD and hypoxia. History of limited stage small cell lung cancer treated in 2010. History of heart disease. EXAM: CT CHEST WITH CONTRAST TECHNIQUE: Multidetector CT imaging of the chest was performed during intravenous contrast administration. RADIATION DOSE REDUCTION: This exam was performed according to the departmental dose-optimization program which includes automated exposure control, adjustment of the mA and/or kV according to patient size and/or use of iterative reconstruction technique. CONTRAST:  50mL OMNIPAQUE IOHEXOL 350 MG/ML SOLN COMPARISON:  Portable chest today. Numerous prior CTs. The 3 most recent are CTA chest 11/10/2022, CTA chest 05/21/2022, and chest CT no contrast 09/08/2021. FINDINGS:  Cardiovascular: The cardiac size is normal overall, but with a prominent left ventricular cavity, which may be seen with cardiomyopathy and prior ischemic disease. There is partial calcification of the mitral annulus. Scattered three-vessel coronary artery calcific plaques. Pulmonary arteries are normal caliber, centrally clear on this nondedicated examination. There is a chronic circumferential pericardial effusion which has increased since 11/10/2022. Previously this was 1.1 cm in thickness now up to 1.5 cm in thickness. The pulmonary veins are decompressed. There is diffuse moderate to heavy aortic calcific plaque, scattered calcifications of the great vessels. There is no aneurysm, stenosis or dissection. Mediastinum/Nodes: There is mild thickening of the thoracic esophagus. No greater than previously, probably related to prior XRT. There is chronic soft tissue thickening in the subcarinal space and along the right mid to upper hilum which were seen previously and also probably from XRT. There is a single mildly prominent left mid hilar lymph node measuring 1.1 cm short axis on 3:61 probably reactive. No other adenopathy is seen. There is no thyroid or axillary mass. The trachea is patent. No thyroid mass is seen. Lungs/Pleura: There is interval new finding of moderate size right and small left layering pleural effusions There is small increased right lower lobe anterior infrahilar consolidation which could be atelectasis or a small pneumonia. There is diffuse bronchial thickening. Respiratory motion limits fine detail  in lung fields. There are small new areas of ground-glass disease in the apex of the left upper lobe which are probably inflammatory/infectious. 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 The lungs are generally otherwise clear. No recurrent tumor is suspected. No  interstitial edema is seen. Upper Abdomen: Old cholecystectomy. Abdominal aortic atherosclerosis. No acute abnormality. No adrenal or liver mass enhancement. Musculoskeletal: There is osteopenia, degenerative changes and mild kyphosis of the thoracic spine, and a mild lower plate anterior wedge compression fracture deformity of the L1 vertebral body. No primary or pathologic bone lesion is seen. IMPRESSION: 1. New moderate right and small left layering pleural effusions, interval increase in chronic pericardial effusion also. No findings of acute pulmonary edema. 2. Small increased right lower lobe anterior infrahilar consolidation which could be atelectasis or a small pneumonia. 3. Small new areas of ground-glass disease in the apex of the left upper lobe which are probably inflammatory/infectious. 4. Diffusely thickened bronchi. 5. Slightly prominent single left mid hilar lymph node is likely reactive. 6. 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 7. Aortic and coronary artery atherosclerosis. 8. Prominent left ventricular cavity, which may be seen with cardiomyopathy and prior ischemic disease. 9. Chronic soft tissue thickening in the subcarinal space and along the right mid to upper hilum which were seen previously and also compatible with XRT. Aortic Atherosclerosis (ICD10-I70.0). Electronically Signed   By: Almira Bar M.D.   On: 02/27/2023 01:10   DG Chest Port 1 View  Result Date: 03/12/2023 CLINICAL DATA:  Shortness of breath. EXAM: PORTABLE CHEST 1 VIEW COMPARISON:  Chest radiograph dated 10/18/2022 and CT dated 11/10/2022. FINDINGS: Right perihilar and right upper lobe opacities as seen on the prior CT and corresponding to post radiation changes. No new consolidative changes. There is no pleural effusion or pneumothorax. Stable cardiac silhouette. No acute osseous pathology. IMPRESSION: 1. No acute cardiopulmonary process. 2. 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 Electronically  Signed   By: Elgie Collard M.D.   On: 02/27/2023 19:40     Assessment and Plan:   LV dysfunction  -Patient had a history of cardiac arrest and torsades in the setting of septic shock in 2017.  EF at that time was 30%.  Cardiac catheterization showed minimal CAD with wall motion abnormality concerning for Takotsubo cardiomyopathy.  EF is eventually normalized on medical therapy.  -EF dropped down to 20% on echocardiogram 02/27/2023.  Last echocardiogram obtained in July 2023 showed EF 60 to 65%.  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  She is not a good candidate for any invasive study given her frailty and significant pulmonary issue.  Would recommend optimize medical therapy and repeat echocardiogram as outpatient.  If EF remains low, can consider outpatient coronary CT at some point.  -Chest x-ray today showed increased left-sided pulmonary opacity, however physical exam argues against acute heart failure, I think she is euvolemic based on physical exam.   Aspiration pneumonia: Managed by primary team  Acute respiratory failure: Potentially has aspiration pneumonia and COPD exacerbation.  History of orthostatic hypotension: She has a history of orthostatic hypotension, therefore her beta-blocker was taken off before.  History of CVA   Risk Assessment/Risk Scores:        New York Heart Association (NYHA) Functional Class NYHA Class III        For questions or updates, please contact [facility] Please  consult [email or web address] for contact info under    Ramond Dial, [place]  03/02/2023 1:52 PM

## 2023-03-02 NOTE — Progress Notes (Signed)
Physical Therapy Treatment Patient Details Name: [name] MRN: [id number] DOB: December 15, 1945 Today's Date: 03/02/2023   History of Present Illness 76 y.o. female presents to [facility] hospital on 03/24/2023 with nausea and vomiting, respiratory distress. PMHx: DM, COPD, CAD, HLD, HTN, lung cancer, GERD, glaucoma, CVA, ckd, and diverticulitis.    PT Comments    Pt noted to be mildly dyspneic on arrival, SPO2 90% at rest on 2LO2. Pt benefits from cues for pursed lip breathing technique throughout session, needs frequent reminders to correctly perform. Pt demonstrating weakness and worse dynamic standing balance vs evaluation, benefits from use of RW for steadying and correcting posterior bias. Pt overall requiring min PT assist during session, which pt states her grandson can provide at home. PT to continue to follow, PT recommends frequent, daily mobility with RN staff to assist pt in regaining strength and balance.   SPO2 85% on RA at rest, 2LO2 used initially during gait. SPO2 drop to 85% on 2LO2, bumped up to 3LO2 with maintaining SPO2 88% and greater during gait    Recommendations for follow up therapy are one component of a multi-disciplinary discharge planning process, led by the attending physician.  Recommendations may be updated based on patient status, additional functional criteria and insurance authorization.  Follow Up Recommendations       Assistance Recommended at Discharge Intermittent Supervision/Assistance  Patient can return home with the following A little help with walking and/or transfers;A little help with bathing/dressing/bathroom;Assistance with cooking/housework;Assist for transportation;Help with stairs or ramp for entrance;Direct supervision/assist for medications management;Direct supervision/assist for financial management   Equipment Recommendations  None recommended by PT    Recommendations for Other Services       Precautions / Restrictions  Precautions Precautions: Fall Precaution Comments: history of posterior falls, states grandson walks behind her in case of LOB Restrictions Weight Bearing Restrictions: No     Mobility  Bed Mobility Overal bed mobility: Needs Assistance Bed Mobility: Supine to Sit     Supine to sit: Min assist, HOB elevated     General bed mobility comments: assist for trunk elevation    Transfers Overall transfer level: Needs assistance Equipment used: Rolling walker (2 wheels) Transfers: Sit to/from Stand Sit to Stand: Min assist           General transfer comment: to rise and steady, stand x3 from EOB x2 and toilet x1    Ambulation/Gait Ambulation/Gait assistance: Min assist Gait Distance (Feet): 15 Feet (+10 to and from bathroom) Assistive device: Rolling walker (2 wheels) Gait Pattern/deviations: Step-through pattern, Decreased stride length, Leaning posteriorly Gait velocity: decr     General Gait Details: assist to steady and correct posterior bias, use of RW. Cues for RW use, room navigation, leaning anteriorly. initially attempted gait without UE support, pt unable to perform given weakness and balance deficit   Stairs             Wheelchair Mobility    Modified Rankin (Stroke Patients Only)       Balance Overall balance assessment: Needs assistance Sitting-balance support: No upper extremity supported, Feet supported Sitting balance-Leahy Scale: Fair     Standing balance support: Single extremity supported, Reliant on assistive device for balance Standing balance-Leahy Scale: Poor Standing balance comment: posterior lean                            Cognition Arousal/Alertness: Awake/alert Behavior During Therapy: WFL for tasks assessed/performed Overall Cognitive Status: History  of cognitive impairments - at baseline                                 General Comments: history of dementia, A&Ox4. Requires repeated safety cues  (lean forward when near posterior LOB, room navigation, RW use, breathing technique)        Exercises      General Comments General comments (skin integrity, edema, etc.): SPO2 85% on RA at rest, 2LO2 used initially during gait. SPO2 drop to 85% on 2LO2, bumped up to 3LO2 with maintaining SPO2 88% and greater during gait      Pertinent Vitals/Pain Pain Assessment Pain Assessment: Faces Faces Pain Scale: Hurts a little bit Pain Location: generalized Pain Descriptors / Indicators: Discomfort Pain Intervention(s): Monitored during session    Home Living                          Prior Function            PT Goals (current goals can now be found in the care plan section) Acute Rehab PT Goals Patient Stated Goal: to return home PT Goal Formulation: With patient Time For Goal Achievement: 03/13/23 Potential to Achieve Goals: Good Progress towards PT goals: Progressing toward goals    Frequency    Min 3X/week      PT Plan Current plan remains appropriate    Co-evaluation              AM-PAC PT "6 Clicks" Mobility   Outcome Measure  Help needed turning from your back to your side while in a flat bed without using bedrails?: A Little Help needed moving from lying on your back to sitting on the side of a flat bed without using bedrails?: A Little Help needed moving to and from a bed to a chair (including a wheelchair)?: A Little Help needed standing up from a chair using your arms (e.g., wheelchair or bedside chair)?: A Little Help needed to walk in hospital room?: A Little Help needed climbing 3-5 steps with a railing? : A Lot 6 Click Score: 17    End of Session   Activity Tolerance: Patient tolerated treatment well;Patient limited by fatigue Patient left: in chair;with call bell/phone within reach;with chair alarm set Nurse Communication: Mobility status PT Visit Diagnosis: Other abnormalities of gait and mobility (R26.89);Muscle weakness  (generalized) (M62.81)     Time: 1610-9604 PT Time Calculation (min) (ACUTE ONLY): 26 min  Charges:  $Gait Training: 8-22 mins $Therapeutic Activity: 8-22 mins                     Marye Round, PT DPT Acute Rehabilitation Services Secure Chat Preferred  Office [phone]    [facility] [address] 03/02/2023, 9:47 AM

## 2023-03-03 ENCOUNTER — Inpatient Hospital Stay (HOSPITAL_COMMUNITY): Payer: Medicare Other

## 2023-03-03 DIAGNOSIS — I5041 Acute combined systolic (congestive) and diastolic (congestive) heart failure: Secondary | ICD-10-CM | POA: Diagnosis not present

## 2023-03-03 DIAGNOSIS — I4729 Other ventricular tachycardia: Secondary | ICD-10-CM | POA: Diagnosis not present

## 2023-03-03 DIAGNOSIS — I5181 Takotsubo syndrome: Secondary | ICD-10-CM | POA: Diagnosis not present

## 2023-03-03 DIAGNOSIS — B348 Other viral infections of unspecified site: Secondary | ICD-10-CM | POA: Diagnosis present

## 2023-03-03 DIAGNOSIS — I10 Essential (primary) hypertension: Secondary | ICD-10-CM | POA: Diagnosis not present

## 2023-03-03 DIAGNOSIS — E871 Hypo-osmolality and hyponatremia: Secondary | ICD-10-CM | POA: Diagnosis not present

## 2023-03-03 DIAGNOSIS — J441 Chronic obstructive pulmonary disease with (acute) exacerbation: Secondary | ICD-10-CM | POA: Diagnosis not present

## 2023-03-03 DIAGNOSIS — I509 Heart failure, unspecified: Secondary | ICD-10-CM | POA: Diagnosis not present

## 2023-03-03 DIAGNOSIS — J90 Pleural effusion, not elsewhere classified: Secondary | ICD-10-CM

## 2023-03-03 LAB — GLUCOSE, CAPILLARY
Glucose-Capillary: 111 mg/dL — ABNORMAL HIGH (ref 70–99)
Glucose-Capillary: 117 mg/dL — ABNORMAL HIGH (ref 70–99)
Glucose-Capillary: 168 mg/dL — ABNORMAL HIGH (ref 70–99)
Glucose-Capillary: 183 mg/dL — ABNORMAL HIGH (ref 70–99)
Glucose-Capillary: 64 mg/dL — ABNORMAL LOW (ref 70–99)
Glucose-Capillary: 74 mg/dL (ref 70–99)
Glucose-Capillary: 85 mg/dL (ref 70–99)

## 2023-03-03 LAB — CBC WITH DIFFERENTIAL/PLATELET
Abs Immature Granulocytes: 0.06 10*3/uL (ref 0.00–0.07)
Basophils Absolute: 0 10*3/uL (ref 0.0–0.1)
Basophils Relative: 0 %
Eosinophils Absolute: 0 10*3/uL (ref 0.0–0.5)
Eosinophils Relative: 0 %
HCT: 30.2 % — ABNORMAL LOW (ref 36.0–46.0)
Hemoglobin: 9.6 g/dL — ABNORMAL LOW (ref 12.0–15.0)
Immature Granulocytes: 1 %
Lymphocytes Relative: 5 %
Lymphs Abs: 0.5 10*3/uL — ABNORMAL LOW (ref 0.7–4.0)
MCH: 27 pg (ref 26.0–34.0)
MCHC: 31.8 g/dL (ref 30.0–36.0)
MCV: 84.8 fL (ref 80.0–100.0)
Monocytes Absolute: 0.6 10*3/uL (ref 0.1–1.0)
Monocytes Relative: 6 %
Neutro Abs: 8.2 10*3/uL — ABNORMAL HIGH (ref 1.7–7.7)
Neutrophils Relative %: 88 %
Platelets: 191 10*3/uL (ref 150–400)
RBC: 3.56 MIL/uL — ABNORMAL LOW (ref 3.87–5.11)
RDW: 18.6 % — ABNORMAL HIGH (ref 11.5–15.5)
WBC: 9.3 10*3/uL (ref 4.0–10.5)
nRBC: 0 % (ref 0.0–0.2)

## 2023-03-03 LAB — CULTURE, BLOOD (ROUTINE X 2): Culture: NO GROWTH

## 2023-03-03 LAB — COMPREHENSIVE METABOLIC PANEL
ALT: 26 U/L (ref 0–44)
AST: 35 U/L (ref 15–41)
Albumin: 3.6 g/dL (ref 3.5–5.0)
Alkaline Phosphatase: 64 U/L (ref 38–126)
Anion gap: 15 (ref 5–15)
BUN: 21 mg/dL (ref 8–23)
CO2: 24 mmol/L (ref 22–32)
Calcium: 8.5 mg/dL — ABNORMAL LOW (ref 8.9–10.3)
Chloride: 91 mmol/L — ABNORMAL LOW (ref 98–111)
Creatinine, Ser: 1.06 mg/dL — ABNORMAL HIGH (ref 0.44–1.00)
GFR, Estimated: 55 mL/min — ABNORMAL LOW (ref >60)
Glucose, Bld: 166 mg/dL — ABNORMAL HIGH (ref 70–99)
Potassium: 3.8 mmol/L (ref 3.5–5.1)
Sodium: 130 mmol/L — ABNORMAL LOW (ref 135–145)
Total Bilirubin: 1 mg/dL (ref 0.3–1.2)
Total Protein: 6.3 g/dL — ABNORMAL LOW (ref 6.5–8.1)

## 2023-03-03 LAB — PHOSPHORUS: Phosphorus: 3.6 mg/dL (ref 2.5–4.6)

## 2023-03-03 LAB — MAGNESIUM: Magnesium: 2.3 mg/dL (ref 1.7–2.4)

## 2023-03-03 MED ORDER — FUROSEMIDE 10 MG/ML IJ SOLN
40.0000 mg | Freq: Three times a day (TID) | INTRAMUSCULAR | Status: DC
  Administered 2023-03-03 – 2023-03-04 (×2): 40 mg via INTRAVENOUS
  Filled 2023-03-03 (×2): qty 4

## 2023-03-03 MED ORDER — METOPROLOL SUCCINATE ER 25 MG PO TB24
25.0000 mg | ORAL_TABLET | Freq: Every day | ORAL | Status: DC
  Administered 2023-03-03 – 2023-03-05 (×3): 25 mg via ORAL
  Filled 2023-03-03 (×3): qty 1

## 2023-03-03 NOTE — Progress Notes (Signed)
Rounding Note    Patient Name: [name] Date of Encounter: 03/03/2023  [facility] Cardiologist: Bryan Lemma, MD   Subjective   Feeling nauseous and short of breath.   Inpatient Medications    Scheduled Meds:  arformoterol  15 mcg Nebulization BID   aspirin EC  81 mg Oral Daily   atorvastatin  40 mg Oral Daily   buPROPion  300 mg Oral Daily   dextromethorphan-guaiFENesin  1 tablet Oral BID   enoxaparin (LOVENOX) injection  40 mg Subcutaneous Daily   feeding supplement  237 mL Oral BID BM   furosemide  40 mg Intravenous BID   insulin aspart  0-15 Units Subcutaneous Q4H   ipratropium-albuterol  3 mL Nebulization Q6H   methylPREDNISolone (SOLU-MEDROL) injection  40 mg Intravenous Q24H   multivitamin with minerals  1 tablet Oral Daily   pantoprazole  40 mg Oral BID   sodium chloride HYPERTONIC  4 mL Nebulization BID   Continuous Infusions:  ampicillin-sulbactam (UNASYN) IV 3 g (03/03/23 0653)   PRN Meds: acetaminophen **OR** acetaminophen, albuterol, ondansetron **OR** ondansetron (ZOFRAN) IV, senna-docusate   Vital Signs    Vitals:   03/03/23 0100 03/03/23 0414 03/03/23 0715 03/03/23 0716  BP: (!) 101/46 109/62    Pulse: 98 (!) 106    Resp: 16 16    Temp: 97.9 F (36.6 C) 97.7 F (36.5 C)    TempSrc: Oral     SpO2: 93% 95% 95% 95%  Weight:      Height:        Intake/Output Summary (Last 24 hours) at 03/03/2023 0820 Last data filed at 03/03/2023 0403 Gross per 24 hour  Intake 1668.37 ml  Output --  Net 1668.37 ml      03/08/2023    7:10 PM 02/17/2023    1:13 PM 01/30/2023   10:51 AM  Last 3 Weights  Weight (lbs) 134 lb 7.7 oz 134 lb 9.6 oz 136 lb  Weight (kg) 61 kg 61.054 kg 61.689 kg      Telemetry    Sinus tachycardia.  NSVT - Personally Reviewed  ECG    N/a - Personally Reviewed  Physical Exam   VS:  BP 114/61 (BP Location: Right Arm)   Pulse (!) 108   Temp (!) 97.5 F (36.4 C)   Resp 19   Ht 5' (1.524 m)   Wt 61  kg   SpO2 100%   BMI 26.26 kg/m  , BMI Body mass index is 26.26 kg/m. GENERAL:  Frail.  Ill-appearing HEENT: Pupils equal round and reactive, fundi not visualized, oral mucosa unremarkable NECK:  No jugular venous distention, waveform within normal limits, carotid upstroke brisk and symmetric, no bruits, no thyromegaly LUNGS: Diffuse expiratory wheezing HEART:  RRR.  PMI not displaced or sustained,S1 and S2 within normal limits, no S3, no S4, no clicks, no rubs, no murmurs ABD:  Flat, positive bowel sounds normal in frequency in pitch, no bruits, no rebound, no guarding, no midline pulsatile mass, no hepatomegaly, no splenomegaly EXT:  2 plus pulses throughout, no edema, no cyanosis no clubbing SKIN:  No rashes no nodules NEURO:  Cranial nerves II through XII grossly intact, motor grossly intact throughout PSYCH:  Cognitively intact, oriented to person place and time   Labs    High Sensitivity Troponin:   Recent Labs  Lab 03/22/2023 1909  2114  TROPONINIHS 19* 17     Chemistry Recent Labs  Lab 03/01/23 0825 03/02/23 0725 03/03/23 1610  NA 131* 131* 130*  K 4.2 4.2 3.8  CL 94* 97* 91*  CO2 25 26 24   GLUCOSE 124* 167* 166*  BUN 14 20 21   CREATININE 1.01* 0.95 1.06*  CALCIUM 8.1* 8.2* 8.5*  MG 2.3 2.3 2.3  PROT 5.9* 5.8* 6.3*  ALBUMIN 2.7* 2.7* 3.6  AST 23 24 35  ALT 16 18 26   ALKPHOS 56 62 64  BILITOT 0.4 0.6 1.0  GFRNONAA 58* >60 55*  ANIONGAP 12 8 15     Lipids  Recent Labs  Lab 03/02/23 0725  CHOL 125  TRIG 45  HDL 62  LDLCALC 54  CHOLHDL 2.0    Hematology Recent Labs  Lab 03/01/23 0336 03/02/23 0725 03/03/23 0639  WBC 10.9* 10.9* 9.3  RBC 4.20 3.98 3.56*  HGB 11.3* 10.5* 9.6*  HCT 34.7* 33.7* 30.2*  MCV 82.6 84.7 84.8  MCH 26.9 26.4 27.0  MCHC 32.6 31.2 31.8  RDW 18.6* 18.6* 18.6*  PLT 233 225 191   Thyroid No results for input(s): "TSH", "FREET4" in the last 168 hours.  BNP Recent Labs  Lab 03/21/2023 1909  BNP 889.7*    DDimer  No results for input(s): "DDIMER" in the last 168 hours.   Radiology    DG CHEST PORT 1 VIEW  Result Date: 03/02/2023 CLINICAL DATA:  Shortness breath EXAM: PORTABLE CHEST 1 VIEW COMPARISON:  Chest x-ray dated Feb 26, 2023 FINDINGS: Cardiac and mediastinal contours are unchanged. Stable posttreatment changes of the right hemithorax. Unchanged small right pleural effusion. New heterogeneous opacities of the left hemithorax. New evidence of pneumothorax. IMPRESSION: 1. New heterogeneous opacities of the left hemithorax, concerning for infection or asymmetric pulmonary edema. 2. Unchanged posttreatment changes of the right hemithorax and small right pleural effusion. Electronically Signed   By: Allegra Lai M.D.   On: 03/02/2023 13:54    Cardiac Studies   Echo 02/27/23: 1. Left ventricular ejection fraction, by estimation, is 20%. The left  ventricle has severely decreased function. The left ventricle demonstrates  regional wall motion abnormalities with global hypokinesis with relatively  preserved basal lateral and  basal anterior wall motion. There was septal-lateral dyssynchrony  consistent with IVCD. No LV thrombus noted. Left ventricular diastolic  parameters are consistent with Grade II diastolic dysfunction  (pseudonormalization).   2. IVC not visualized. Right ventricular systolic function is normal. The  right ventricular size is normal. Tricuspid regurgitation signal is  inadequate for assessing PA pressure.   3. Left atrial size was mildly dilated.   4. The mitral valve is abnormal. Moderate mitral valve regurgitation. No  evidence of mitral stenosis. Moderate mitral annular calcification.   5. The aortic valve is tricuspid. There is moderate calcification of the  aortic valve. Aortic valve regurgitation is mild. Mild aortic valve  stenosis. Aortic valve area, by VTI measures 1.75 cm. Aortic valve mean  gradient measures 14.0 mmHg.   6. A small pericardial effusion is present.  The pericardial effusion is  circumferential.   Patient Profile     [name] is a 65F with Takotsubo cardiomyopathy with recovered LVEF, non-obstructive CAD, LBBB, orthostatic hypotension, COPD, lung cancer s/p XRT, and cardiac arrest in the setting of torsades admitted with cough and wheezing. Cardiology consulted for heart failure.   Assessment & Plan    # Acute systolic and diastolic HF:  History of Takotsubo with recovered LVEF.  Echo this admission reveals LVEF 20% with global hypokinesis.  She has no ischemic symptoms.  Suspect this is again stress  cardiomyopathy in the setting of her respiratory illness.  CXR consistent with infiltrate vs pulmonary edema.  I don't hear crackles on exam and she appears euvolemic.  She is very wheezy on exam.  She was started on IV lasix.  Would monitor renal function closely as she does't seem volume overloaded on exam.  IVC not visualized on echo. ReDs vest was elevated, though hard to know what to make of this in the setting of her acute lung disease.  BP is low.  Start losartan 25mg  when BP allows.  Will add metoprolol succinate 25mg  daily given her tachycardia and NSVT.  She has a history of orthostatic hypotension so will add GDMT slowly.  Repeat echo in a month.  If she has persistently low LV function, repeat ischemic evaluation as an outpatient.   # NSVT:  Add metoprolol as above.    # CAD:  # Hyperlipidemia: Non-obstructive.  Continue aspirin and statin.   # Nausea:  Will order zofan.       For questions or updates, please contact [facility] Please consult [email or web address] for contact info under        Signed, Chilton Si, MD  03/03/2023, 8:20 AM

## 2023-03-03 NOTE — Progress Notes (Signed)
Nutrition Brief Note:   Pt is currently NPO due to aspiration. RD messaged MD, about POC surrounding nutrition. Plan is for SLP to evaluate before determining diet advancement. Pt is currently full code and per record, has had poor PO since admit. Pt may benefit from EN support if unable to advance diet.    Bethann Humble, RD, LDN, CNSC.

## 2023-03-03 NOTE — Progress Notes (Signed)
Pt CBG was 64. Gave pt graham crackers 4oz of orange juice and apple sauce. Will recheck CBG  and monitor.

## 2023-03-03 NOTE — Progress Notes (Signed)
Patient arrived back from test, I sin room, bed brakes locked, call light within reach, [place] and O2 on. Patient appears comfortable. Will continue to monitor.

## 2023-03-03 NOTE — Progress Notes (Signed)
Pt CBG recheck was 85. Pt stable. Will continue to monitor, MD notified.

## 2023-03-03 NOTE — Evaluation (Signed)
Clinical/Bedside Swallow Evaluation Patient Details  Name: [name] MRN: [id number] Date of Birth: 1946-01-10  Today's Date: 03/03/2023 Time: SLP Start Time (ACUTE ONLY): 1228 SLP Stop Time (ACUTE ONLY): 1240 SLP Time Calculation (min) (ACUTE ONLY): 12 min  Past Medical History:  Past Medical History:  Diagnosis Date   Acute renal failure (HCC)    Age-related nuclear cataract of both eyes 2016   +cortical age related cataracts OU    Anxiety and depression    Bilateral diabetic retinopathy (HCC) 2015   Dr. 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   Colonic diverticular abscess    COPD (chronic obstructive pulmonary disease) (HCC)    CVA (cerebral vascular accident) (HCC) 09/2016   MRI did show a left-sided small ischemic stroke which was acute but likely incidental (MRI was done b/c pt had TIA sx's in hospital).  Neuro put her on plavix at that time.  Cardiology d/c'd her plavix 08/2017.   Diabetes mellitus with complication (HCC)    diab retinopathy OU (laser)   Diverticulitis of large intestine with perforation and abscess 09/11/2016   Finger fracture, right 04/2020   R 5th metacarpal fx at the base (Dr. Dion Saucier)   GERD (gastroesophageal reflux disease)    protonix   History of concussion 08/25/2018   w/out loss of consciousness--mild increase in baseline memory impairment after.  CT head neg acute.   History of diverticulitis of colon    with abscess; required IR percutaneous drain placed 09/2016.   History of iron deficiency anemia    started iron 07/02/19.  EGD/colonoscopy 08/27/19 unrevealing. Capsule endoscopy considered but never done.   Hyperlipidemia    Hypertension    Left bundle branch block (LBBB) 12/16/2016   Lung cancer (HCC)    non-small cell lung ca, stage III in 05/2011;  systemic chemotherapy concurrent with radiation followed by prophylactic cranial irradiation and has been observation since July of 2010 with no evidence for disease recurrence-released from onc f/u 12/2014 (needs annual cxr by PCP).  CXR 08/2015 stable.  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 Shirline Frees did bx and this was NEG for malignancy.   Mild cognitive impairment with memory loss    Likely from brain radiation therapy   Non-obstructive CAD (coronary artery disease)    a. Cath 2006 preserved LV fxn, scattered irregularities without critical stenosis; b. 2008 stress echo negative for ischemia, but with hypertensive response; c. 08/2016 Cath: D1 25%, otw nl.   Normocytic anemia 2018   Iron, vit B12, folate all normal 12/2016.   Open-angle glaucoma 2016   Dr. Hanley Seamen, (bilateral)---responding to topical therapy   Osteoarthritis of both hands    Osteoporosis 03/2016; 03/2018   03/2016 DEXA T-score -2.2.  03/2018 T-score L femoral neck  -2.7--boniva started. DEXA 04/2021 T scor -2.6->boniva continued. Plan rpt DEXA 2 yrs.   Pneumonia    hospitalization 11/2016; hypoxemic resp failure--d/c'd home with home oxygen therapy   Pulmonary nodule, left 08/2021   LUL 11 mm ground glass, needs f/u CT 08/2022.   Seasonal allergic rhinitis    Subclinical hyperthyroidism 2018   Takotsubo cardiomyopathy    a. 08/2016 Echo: EF 30-35%, gr1DD, PASP ;  b. 08/2016 Cath: nonobs Dzs;  c. 11/2016 Echo: EF 40-45%, no rwma, nl RV fxn, mild TR, PASP .  ECHO 02/2017 EF 50-55%, grd II DD---essentially resolved Takotsubo 02/2017.   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 08/2016 in setting of diverticulitis & pneumoperitoneum and Takotsubo CM -->prolonged QT, seen by EP-->avoid meds with potential for QT prolongation.   Past Surgical History:  Past Surgical History:  Procedure Laterality Date   ABDOMINAL HYSTERECTOMY  1997   APPENDECTOMY     CARDIAC CATHETERIZATION N/A 09/14/2016   Procedure: Left Heart Cath and  Coronary Angiography;  Surgeon: Lennette Bihari, MD;  Location: [facility];  Service: Cardiovascular; Mild nonobstructive CAD with 85% smooth narrowing in the first diagonal branch of the LAD; 10% smooth narrowing of the ostial proximal left circumflex coronary artery; and a normal dominant RCA.   CARPAL TUNNEL RELEASE     CATARACT EXTRACTION Bilateral    CHOLECYSTECTOMY  2002   COLONOSCOPY  10/24/00; 08/27/19   2002 normal.  [employer] hemoccult testing via [facility] 06/18/15 was NEG.  2020->diverticulosis o/w nl.   dexa  03/2016; 03/2018   03/2018 T-score L femoral neck  -2.7 (worsened compared to osteopenic range 2017.) DEXA 05/03/21 stable T score -2.6 on boniva x 2 yrs->plan rpt DEXA 2 yrs.   ESOPHAGOGASTRODUODENOSCOPY  08/27/2019   Normal.   IR GENERIC HISTORICAL  09/19/2016   IR SINUS/FIST TUBE CHK-NON GI 09/19/2016 Gilmer Mor, DO [facility]   IR GENERIC HISTORICAL  10/06/2016   IR RADIOLOGIST EVAL & MGMT 10/06/2016 [facility]   IR GENERIC HISTORICAL  10/26/2016   IR RADIOLOGIST EVAL & MGMT 10/26/2016 [facility]   IR GENERIC HISTORICAL  11/03/2016   IR RADIOLOGIST EVAL & MGMT 11/03/2016 Gershon Crane, PA-C [facility]   IR GENERIC HISTORICAL  11/24/2016   IR RADIOLOGIST EVAL & MGMT 11/24/2016 Gershon Crane, PA-C [facility]   IR GENERIC HISTORICAL  12/05/2016   Fistula smaller/improving.  IR SINUS/FIST TUBE CHK-NON GI 12/05/2016 Simonne Come, MD [facility]   IR GENERIC HISTORICAL  12/22/2016   IR RADIOLOGIST EVAL & MGMT 12/22/2016 Berdine Dance, MD [facility]   IR RADIOLOGIST EVAL & MGMT  01/10/2017   IR RADIOLOGIST EVAL & MGMT  02/09/2017   IR SINUS/FIST TUBE CHK-NON GI  03/28/2017   LEFT HEART CATHETERIZATION WITH CORONARY ANGIOGRAM N/A 05/30/2012   Procedure: LEFT HEART CATHETERIZATION WITH CORONARY ANGIOGRAM;  Surgeon: Kathleene Hazel, MD;  Location: [facility] CATH LAB;  Service: Cardiovascular: Mild, non-obstructive CAD   TRANSTHORACIC  ECHOCARDIOGRAM  09/14/2016; 11/2016   08/2016: EF 30-35 %, Akinesis of the mid-apicalanteroseptal myocardium.  Grade I DD.  Mild pulm HTN.  Repeat echo 11/2016, EF 40-45%, no rwma, nl RV fxn, mild TR, PASP .     TRANSTHORACIC ECHOCARDIOGRAM  03/07/2017   EF 50-55%. Hypokinesis of the distal septum with overall low normal LV,  systolic function; mild diastolic dysfunction with elevated LV  filling pressure; mildly calcified aortic valve with mild AI; small pericardial effusion   TRANSTHORACIC ECHOCARDIOGRAM     04/2022 EF 60-65%, no WMA, no valve probs   Zio patch  11/2022   3 day:  primarily sinus, avg HR 97, brief run SVT, rare PVC   HPI:  Pt is a 76-y/o female who presented secondary to nausea and vomiting and worsening cough over three days. CT chest 5/6:  New moderate right and small left layering pleural effusions, interval increase in chronic pericardial effusion also. No findings of acute pulmonary edema. Small increased right lower lobe anterior infrahilar consolidation which  could be atelectasis or a small pneumonia. Pt evaluated by SLP 5/6; swallow function appeared WNL and a regular texture diet with thin liquids was recommended without need for follow up. SLP re-consulted 5/10 with report that "Pt coughs excessively when eating or drinking. Dry heaves from coughing." PMH: Takotsubo syndrome, hypothyroidism, CHF, COPD, anxiety and depression, DM Type 2, dementia, HLD, HTN, h/o small cell lung cancer, and h/o CVA.  She has a chronic cough since her lung cancer treatment/radiation.    Assessment / Plan / Recommendation  Clinical Impression  Pt was seen for bedside swallow re-evaluation due to RN's concern regarding coughing with p.o. intake. Oral mechanism exam was [facility] and she remains edentulous. Pt demonstrated audible breath sounds, wheezing, and coughing at baseline. Coughing was noted once with thin liquids via cup and throat clearing was intermittently observed with thin liquids via  straw, but she tolerated solids and consecutive swallows of thin liquids without overt s/s of aspiration. Oral phase appeared [facility] A modified barium swallow study will be conducted to further assess swallowing physiology; it scheduled for today at 1400. SLP Visit Diagnosis: Dysphagia, unspecified (R13.10)    Aspiration Risk  Mild aspiration risk    Diet Recommendation  (Deferred until MBS completed)   Medication Administration: Whole meds with puree    Other  Recommendations Oral Care Recommendations: Oral care BID    Recommendations for follow up therapy are one component of a multi-disciplinary discharge planning process, led by the attending physician.  Recommendations may be updated based on patient status, additional functional criteria and insurance authorization.  Follow up Recommendations        Assistance Recommended at Discharge    Functional Status Assessment Patient has had a recent decline in their functional status and demonstrates the ability to make significant improvements in function in a reasonable and predictable amount of time.  Frequency and Duration min 2x/week  2 weeks       Prognosis Prognosis for improved oropharyngeal function: Good Barriers to Reach Goals: Cognitive deficits      Swallow Study   General Date of Onset: 02/27/23 HPI: Pt is a 76-y/o female who presented secondary to nausea and vomiting and worsening cough over three days. CT chest 5/6:  New moderate right and small left layering pleural effusions, interval increase in chronic pericardial effusion also. No findings of acute pulmonary edema. Small increased right lower lobe anterior infrahilar consolidation which could be atelectasis or a small pneumonia. Pt evaluated by SLP 5/6; swallow function appeared WNL and a regular texture diet with thin liquids was recommended without need for follow up. SLP re-consulted 5/10 with report that "Pt coughs excessively when eating or drinking. Dry heaves  from coughing." PMH: Takotsubo syndrome, hypothyroidism, CHF, COPD, anxiety and depression, DM Type 2, dementia, HLD, HTN, h/o small cell lung cancer, and h/o CVA.  She has a chronic cough since her lung cancer treatment/radiation. Type of Study: Bedside Swallow Evaluation Previous Swallow Assessment: see HPI Diet Prior to this Study: Regular;Thin liquids (Level 0) Temperature Spikes Noted: No Respiratory Status: Nasal cannula History of Recent Intubation: No Behavior/Cognition: Alert;Cooperative;Pleasant mood Oral Care Completed by SLP: No Oral Cavity - Dentition: Edentulous;Dentures, not available Vision: Functional for self-feeding Baseline Vocal Quality: Normal Volitional Cough: Strong Volitional Swallow: Able to elicit    Oral/Motor/Sensory Function Overall Oral Motor/Sensory Function: Within functional limits   Ice Chips Ice chips: Within functional limits Presentation: Spoon   Thin Liquid Thin Liquid: Impaired Presentation: Straw;Self Fed;Cup Pharyngeal  Phase Impairments:  Throat Clearing - Immediate;Cough - Immediate;Throat Clearing - Delayed    Nectar Thick Nectar Thick Liquid: Not tested   Honey Thick Honey Thick Liquid: Not tested   Puree Puree: Within functional limits Presentation: Spoon   Solid     Solid: Within functional limits Presentation: Leota Jacobsen I. Vear Clock, MS, CCC-SLP Neuro Diagnostic Specialist  Acute Rehabilitation Services Office number: [phone]  Scheryl Marten 03/03/2023,1:39 PM

## 2023-03-03 NOTE — Progress Notes (Signed)
Modified Barium Swallow Study  Patient Details  Name: [name] MRN: [id number] Date of Birth: Nov 22, 1945  Today's Date: 03/03/2023  Modified Barium Swallow completed.  Full report located under Chart Review in the Imaging Section.  History of Present Illness Pt is a 76-y/o female who presented secondary to nausea and vomiting and worsening cough over three days. CT chest 5/6:  New moderate right and small left layering pleural effusions, interval increase in chronic pericardial effusion also. No findings of acute pulmonary edema. Small increased right lower lobe anterior infrahilar consolidation which could be atelectasis or a small pneumonia. Pt evaluated by SLP 5/6; swallow function appeared WNL and a regular texture diet with thin liquids was recommended without need for follow up. SLP re-consulted 5/10 with report that "Pt coughs excessively when eating or drinking. Dry heaves from coughing." PMH: Takotsubo syndrome, hypothyroidism, CHF, COPD, anxiety and depression, DM Type 2, dementia, HLD, HTN, h/o small cell lung cancer, and h/o CVA.  She has a chronic cough since her lung cancer treatment/radiation.   Clinical Impression Pt presents with oropharyngeal dysphagia characterized by a pharyngeal delay, and reduction in bolus cohesion, hyolaryngeal elevation, and anterior laryngeal excursion. She demonstrated escape of liquid boluses to the oral cavity floor, premature spillage, intermittently incomplete epiglottic inversion, and residue in the valleculae, and pyriform sinuses. Residue was improved with secondary swallows and/or a liquid wash. Penetration (PAS 3,5) was noted with thin liquids and to a less severe extent (Rachaele., PAS 3) with nectar thick, and honey thick liquids. Silent aspiration (PAS 8) was observed with thin liquids. Repeated instances of aspiration appeared to ultimately be sensed when pt was returned to her bed after the study as evidenced by significant coughing. A chin  tuck posture eliminated aspiration and improved laryngeal invasion of thin liquids to penetration (PAS 3), but prompted coughing was ineffective. With nectar thick liquids, a chin tuck posture eliminated dysfunctional penetration, improving it to PAS 2 (considered WNL). A dysphagia 3 diet with nectar thick liquids is recommended with observance of swallowing precautions including a chin tuck posture. Pt verbalized understanding and agreement regarding recommendations, but SLP anticipates that full supervision will be necessary to ensure consistent observance of precautions. SLP will follow pt for treatment. Factors that may increase risk of adverse event in presence of aspiration [name] & [name] 2021): Respiratory or GI disease;Reduced cognitive function  Swallow Evaluation Recommendations Recommendations: PO diet PO Diet Recommendation: Dysphagia 3 (Mechanical soft);Mildly thick liquids (Level 2, nectar thick) Liquid Administration via: Cup;Straw Medication Administration: Crushed with puree (or whole with nectar thick liquids) Supervision: Patient able to self-feed;Full supervision/cueing for swallowing strategies Swallowing strategies  : Slow rate;Small bites/sips;Chin tuck Postural changes: Position pt fully upright for meals Oral care recommendations: Oral care BID (2x/day) Caregiver Recommendations: Avoid jello, ice cream, thin soups, popsicles   [name] I. Rachael Clock, MS, CCC-SLP Neuro Diagnostic Specialist  Acute Rehabilitation Services Office number: [phone]   [name] 03/03/2023,3:11 PM

## 2023-03-03 NOTE — Progress Notes (Signed)
[name] ZOX:[id number] DOB: October 17, 1946 DOA: 03/07/2023 PCP: [name], MD   Subj:   77 yo WF PMHx depression and dementia, Non ischemic, stress induced cardiomyopathy, HTN, COPD, DM type II,,  small cell lung cancer s/p XRT, Hx of CVA,    Presented with dyspnea. Patient reported 3 days of worsening cough with increase sputum production, persistent despite use of nebulizer treatments at home. On the day of hospitalization she developed wheezing, and respiratory distress. EMS was called, patient was placed on Cpap and transported to the ED. Note that she vomited while on Cpap and then this device was removed. On her initial physical examination in the ED her blood pressure was 113/75, HR 113, RR 16 and 02 saturation 99%, her lungs had coarse breath sounds, and positive wheezing, heart with S1 and S2 present and rhythmic, abdomen with no distention and no lower extremity edema.    Na 126, K 4,5 CL 91, bicarbonate 23, glucose 154, bun 10 cr 1.0 BNP 889 High sensitive troponin 19  Wbc 8,6 hgb 12.1 plt 282  Sars covid 19 negative    CXR with hyperinflation, right upper love scarring, right sided pleural effusion.   CT chest with bilateral ground glass opacities, right upper lobe scarring. Right pleural effusion, interval increase in chronic pericardial effusion. Small increased right lower lobe anterior infrahilar consolidation. 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    EKG 111 bpm, normal axis, left bundle branch block, qtc less than 500, sinus rhythm with PAC with aberrancy, q wave V1 to V3, no significant ST segment or T wave changes.    Obj: 5/10 A/O x 4, afebrile overnight, positive SOB.  Negative CP.  Patient states increase fatigue does not feel well.    Objective: VITAL SIGNS: Temp: 98.4 F (36.9 C) (05/10 2025) BP: 98/50 (05/10 2025) Pulse Rate: 87 (05/10 2025)   VENTILATOR SETTINGS: Nasal cannula 5/10 Flow 2 L/min SpO2=  95%  Procedures/Significant Events: 5/6 CT chest W contrast 1. New moderate right and small left layering pleural effusions, interval increase in chronic pericardial effusion also. No findings of acute pulmonary edema. 2. Small increased right lower lobe anterior infrahilar consolidation which could be atelectasis or a small pneumonia. 3. Small new areas of ground-glass disease in the apex of the left upper lobe which are probably inflammatory/infectious. 4. Diffusely thickened bronchi. 5. Slightly prominent single left mid hilar lymph node is likely reactive. 6. 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 7. Aortic and coronary artery atherosclerosis. 8. Prominent left ventricular cavity, which may be seen with cardiomyopathy and prior ischemic disease. 9. Chronic soft tissue thickening in the subcarinal space and along the right mid to upper hilum which were seen previously and also compatible with XRT.    Consultants:     Cultures 5/8 positive rhinovirus   Antimicrobials: Anti-infectives (From admission, onward)    Start     Ordered Stop   02/27/23 0015  Ampicillin-Sulbactam (UNASYN) 3 g in sodium chloride 0.9 % 100 mL IVPB        02/27/23 0004         Physical Exam:  General: A/O x 4, positive  acute respiratory distress Eyes: negative scleral hemorrhage, negative anisocoria, negative icterus ENT: Negative Runny nose, negative gingival bleeding, Neck:  Negative scars, masses, torticollis, lymphadenopathy, JVD Lungs: decreased breath sounds increased rhonchi, increased WOB using accessory muscles.  Positive bibasilar crackles  Cardiovascular: Regular rate and rhythm without murmur gallop or rub normal S1 and S2  Abdomen: negative abdominal pain, nondistended, positive soft, bowel sounds, no rebound, no ascites, no appreciable mass Extremities: No significant cyanosis, clubbing, or edema bilateral lower extremities Skin: Negative rashes, lesions,  ulcers Psychiatric:  Negative depression, negative anxiety, negative fatigue, negative mania  Central nervous system:  Cranial nerves II through XII intact, tongue/uvula midline, all extremities muscle strength 5/5, sensation intact throughout, negative dysarthria, negative expressive aphasia, negative receptive aphasia.   .   DVT prophylaxis: Lovenox  Code Status: Full Family Communication:  Status is: Inpatient    Dispo: The patient is from: Home              Anticipated d/c is to: SNF              Anticipated d/c date is: > 3 days              Patient currently is not medically stable to d/c.      Assessment & Plan: Covid vaccination;   Active Problems:   Chronic obstructive pulmonary disease with acute exacerbation (HCC)   Takotsubo cardiomyopathy   Hyponatremia   Essential hypertension   Dementia without behavioral disturbance (HCC)   Type 2 diabetes mellitus with hyperlipidemia (HCC)   Malnutrition of moderate degree   Acute on chronic congestive heart failure (HCC)   NSVT (nonsustained ventricular tachycardia) (HCC)   Rhinovirus infection   Bilateral pleural effusion  COPD with acute exacerbation (HCC)/acute respiratory failure with hypoxia -Continue current antibiotics to complete 7-day course - DuoNeb QID -Hypertonic saline nebs BID x 3 days -Flutter valve - Incentive spirometry - Solu-Medrol 40 mg daily - Mucinex DM BID  CAP Right lower lobe pneumonia/ Aspiration PNA/positive rhinovirus -5/6 passed swallow evaluation by speech. -5/8 respiratory virus panel pending -5/8 chronic RUL fibrosis, with atelectasis vs pneumonia.  See above -5/8 see COPD -5/9 PCXR consistent with pulmonary edema -5/10 CT chest R/O neoplasm etc. -5/10 per RN Tiney Rouge patient sounding much more wet, increased WOB.  I concur.   -5/10 make n.p.o. reconsult speech for swallow evaluation  Bilateral pleural effusion - 5/10 patient has Chronic right upper lobe paramediastinal  radiation fibrosis.  Which appears to me to have occluded right upper lobe.  Secondary to small cell lung cancer s/p XRT - 5/10 consult PCCM in a.m. doubt there is anything they can do, possibly stent? - 5/10 repeat chest CT pending  Takotsubo cardiomyopathy/Acute on chronic systolic heart failure.  -Echocardiogram LVEF = 20%, no LVH, RV systolic function is preserved, small pericardial effusion, moderate MR, mild aortic stenosis.  New findings.  Last echocardiogram 05/22/2022 LVEF=60 to 65%  -Dr. Bryan Lemma is patient's cardiologist last seen 4/13 -5/8 strict in and out +1.1 L - 5/8 Daily weight Filed Weights   03/20/2023 1910  Weight: 61 kg  -5/9 consult cardiology, they will see patient today will await recommendations. - 5/9 patient continues SOB, sounds wet increase Lasix IV 40 mg BID -5/9 Albumin 50 g prior to Lasix -5/10 PureWick - 5/10 increase Lasix IV 40 mg TID   Hyponatremia Lab Results  Component Value Date   NA 130 (L) 03/03/2023   NA 131 (L) 03/02/2023   NA 131 (L) 03/01/2023   NA 131 (L) 03/01/2023   NA 129 (L) 02/28/2023  -Improving   Essential hypertension -See CHF   Dementia without behavioral disturbance (HCC) -5/8 must be mild, A/O x 4 today.     Type 2 diabetes mellitus controlled with HLD (HCC) -4/26 hemoglobin A1c= 6.5 - Moderate SSI CBG (  last 3)  Recent Labs    03/03/23 0817 03/03/23 1247 03/03/23 1622  GLUCAP 183* 117* 74    HLD - 5/9 lipid panel; LDL= 54.  Within goal - Lipitor 40 mg daily   Malnutrition of moderate degree -Continue with nutritional supplements.   Goals of care - 5/10 palliative care consult: Patient with multiple medical conditions, slowly deteriorating consider changing CODE STATUS to DNR, hospice.     Mobility Assessment (last 72 hours)     Mobility Assessment     Row Name 03/03/23 0920 03/02/23 2147 03/02/23 0946 03/02/23 0930 03/01/23 2230   Does patient have an order for bedrest or is patient medically  unstable No - Continue assessment No - Continue assessment -- No - Continue assessment No - Continue assessment   What is the highest level of mobility based on the progressive mobility assessment? Level 4 (Walks with assist in room) - Balance while marching in place and cannot step forward and back - Complete Level 4 (Walks with assist in room) - Balance while marching in place and cannot step forward and back - Complete Level 4 (Walks with assist in room) - Balance while marching in place and cannot step forward and back - Complete Level 4 (Walks with assist in room) - Balance while marching in place and cannot step forward and back - Complete Level 5 (Walks with assist in room/hall) - Balance while stepping forward/back and can walk in room with assist - Complete    Row Name 03/01/23 1030           Does patient have an order for bedrest or is patient medically unstable No - Continue assessment       What is the highest level of mobility based on the progressive mobility assessment? Level 5 (Walks with assist in room/hall) - Balance while stepping forward/back and can walk in room with assist - Complete                     Time: 50 minutes         Care during the described time interval was provided by me .  I have reviewed this patient's available data, including medical history, events of note, physical examination, and all test results as part of my evaluation.

## 2023-03-04 DIAGNOSIS — I5181 Takotsubo syndrome: Secondary | ICD-10-CM | POA: Diagnosis not present

## 2023-03-04 DIAGNOSIS — I509 Heart failure, unspecified: Secondary | ICD-10-CM | POA: Diagnosis not present

## 2023-03-04 DIAGNOSIS — N183 Chronic kidney disease, stage 3 unspecified: Secondary | ICD-10-CM | POA: Diagnosis present

## 2023-03-04 DIAGNOSIS — I10 Essential (primary) hypertension: Secondary | ICD-10-CM | POA: Diagnosis not present

## 2023-03-04 DIAGNOSIS — I5041 Acute combined systolic (congestive) and diastolic (congestive) heart failure: Secondary | ICD-10-CM | POA: Diagnosis not present

## 2023-03-04 DIAGNOSIS — J441 Chronic obstructive pulmonary disease with (acute) exacerbation: Secondary | ICD-10-CM | POA: Diagnosis not present

## 2023-03-04 DIAGNOSIS — I4729 Other ventricular tachycardia: Secondary | ICD-10-CM | POA: Diagnosis not present

## 2023-03-04 DIAGNOSIS — B348 Other viral infections of unspecified site: Secondary | ICD-10-CM

## 2023-03-04 DIAGNOSIS — Z515 Encounter for palliative care: Secondary | ICD-10-CM

## 2023-03-04 DIAGNOSIS — E871 Hypo-osmolality and hyponatremia: Secondary | ICD-10-CM | POA: Diagnosis not present

## 2023-03-04 LAB — GLUCOSE, CAPILLARY
Glucose-Capillary: 103 mg/dL — ABNORMAL HIGH (ref 70–99)
Glucose-Capillary: 121 mg/dL — ABNORMAL HIGH (ref 70–99)
Glucose-Capillary: 136 mg/dL — ABNORMAL HIGH (ref 70–99)
Glucose-Capillary: 139 mg/dL — ABNORMAL HIGH (ref 70–99)
Glucose-Capillary: 165 mg/dL — ABNORMAL HIGH (ref 70–99)
Glucose-Capillary: 67 mg/dL — ABNORMAL LOW (ref 70–99)
Glucose-Capillary: 84 mg/dL (ref 70–99)
Glucose-Capillary: 90 mg/dL (ref 70–99)

## 2023-03-04 LAB — CBC WITH DIFFERENTIAL/PLATELET
Abs Immature Granulocytes: 0.14 10*3/uL — ABNORMAL HIGH (ref 0.00–0.07)
Basophils Absolute: 0 10*3/uL (ref 0.0–0.1)
Basophils Relative: 0 %
Eosinophils Absolute: 0 10*3/uL (ref 0.0–0.5)
Eosinophils Relative: 0 %
HCT: 30 % — ABNORMAL LOW (ref 36.0–46.0)
Hemoglobin: 9.4 g/dL — ABNORMAL LOW (ref 12.0–15.0)
Immature Granulocytes: 1 %
Lymphocytes Relative: 4 %
Lymphs Abs: 0.4 10*3/uL — ABNORMAL LOW (ref 0.7–4.0)
MCH: 26.7 pg (ref 26.0–34.0)
MCHC: 31.3 g/dL (ref 30.0–36.0)
MCV: 85.2 fL (ref 80.0–100.0)
Monocytes Absolute: 1 10*3/uL (ref 0.1–1.0)
Monocytes Relative: 9 %
Neutro Abs: 8.8 10*3/uL — ABNORMAL HIGH (ref 1.7–7.7)
Neutrophils Relative %: 86 %
Platelets: 213 10*3/uL (ref 150–400)
RBC: 3.52 MIL/uL — ABNORMAL LOW (ref 3.87–5.11)
RDW: 18.8 % — ABNORMAL HIGH (ref 11.5–15.5)
WBC: 10.3 10*3/uL (ref 4.0–10.5)
nRBC: 0.3 % — ABNORMAL HIGH (ref 0.0–0.2)

## 2023-03-04 LAB — COMPREHENSIVE METABOLIC PANEL
ALT: 3379 U/L — ABNORMAL HIGH (ref 0–44)
AST: 4961 U/L — ABNORMAL HIGH (ref 15–41)
Albumin: 3.1 g/dL — ABNORMAL LOW (ref 3.5–5.0)
Alkaline Phosphatase: 84 U/L (ref 38–126)
Anion gap: 14 (ref 5–15)
BUN: 35 mg/dL — ABNORMAL HIGH (ref 8–23)
CO2: 26 mmol/L (ref 22–32)
Calcium: 8.2 mg/dL — ABNORMAL LOW (ref 8.9–10.3)
Chloride: 90 mmol/L — ABNORMAL LOW (ref 98–111)
Creatinine, Ser: 1.44 mg/dL — ABNORMAL HIGH (ref 0.44–1.00)
GFR, Estimated: 38 mL/min — ABNORMAL LOW (ref >60)
Glucose, Bld: 153 mg/dL — ABNORMAL HIGH (ref 70–99)
Potassium: 4.3 mmol/L (ref 3.5–5.1)
Sodium: 130 mmol/L — ABNORMAL LOW (ref 135–145)
Total Bilirubin: 1.4 mg/dL — ABNORMAL HIGH (ref 0.3–1.2)
Total Protein: 5.9 g/dL — ABNORMAL LOW (ref 6.5–8.1)

## 2023-03-04 LAB — PHOSPHORUS: Phosphorus: 5.8 mg/dL — ABNORMAL HIGH (ref 2.5–4.6)

## 2023-03-04 LAB — MAGNESIUM: Magnesium: 2.6 mg/dL — ABNORMAL HIGH (ref 1.7–2.4)

## 2023-03-04 LAB — CULTURE, BLOOD (ROUTINE X 2): Special Requests: ADEQUATE

## 2023-03-04 LAB — PROCALCITONIN: Procalcitonin: 0.17 ng/mL

## 2023-03-04 MED ORDER — SODIUM CHLORIDE 0.9 % IV SOLN
100.0000 mg | Freq: Two times a day (BID) | INTRAVENOUS | Status: DC
  Administered 2023-03-04 (×2): 100 mg via INTRAVENOUS
  Filled 2023-03-04 (×3): qty 100

## 2023-03-04 MED ORDER — DEXTROSE 50 % IV SOLN: 12.5000 g | INTRAVENOUS | Status: DC | PRN

## 2023-03-04 MED ORDER — FUROSEMIDE 40 MG PO TABS
40.0000 mg | ORAL_TABLET | Freq: Every day | ORAL | Status: DC
  Administered 2023-03-05: 40 mg via ORAL
  Filled 2023-03-04: qty 1

## 2023-03-04 MED ORDER — ENOXAPARIN SODIUM 30 MG/0.3ML IJ SOSY
30.0000 mg | PREFILLED_SYRINGE | Freq: Every day | INTRAMUSCULAR | Status: DC
  Administered 2023-03-05: 30 mg via SUBCUTANEOUS
  Filled 2023-03-04: qty 0.3

## 2023-03-04 MED ORDER — SODIUM CHLORIDE 0.9 % IV SOLN
1.0000 g | Freq: Two times a day (BID) | INTRAVENOUS | Status: DC
  Administered 2023-03-04 – 2023-03-05 (×3): 1 g via INTRAVENOUS
  Filled 2023-03-04 (×3): qty 20

## 2023-03-04 MED ORDER — EMPAGLIFLOZIN 10 MG PO TABS
10.0000 mg | ORAL_TABLET | Freq: Every day | ORAL | Status: DC
  Administered 2023-03-04 – 2023-03-05 (×2): 10 mg via ORAL
  Filled 2023-03-04 (×2): qty 1

## 2023-03-04 NOTE — Progress Notes (Signed)
Rounding Note    Patient Name: [name] Date of Encounter: 03/04/2023  [facility] Cardiologist: Rachael Lemma, MD   Subjective   Audible wheezing. No CP  Inpatient Medications    Scheduled Meds:  arformoterol  15 mcg Nebulization BID   aspirin EC  81 mg Oral Daily   atorvastatin  40 mg Oral Daily   buPROPion  300 mg Oral Daily   dextromethorphan-guaiFENesin  1 tablet Oral BID   enoxaparin (LOVENOX) injection  40 mg Subcutaneous Daily   feeding supplement  237 mL Oral BID BM   furosemide  40 mg Intravenous TID   insulin aspart  0-15 Units Subcutaneous Q4H   ipratropium-albuterol  3 mL Nebulization Q6H   methylPREDNISolone (SOLU-MEDROL) injection  40 mg Intravenous Q24H   metoprolol succinate  25 mg Oral Daily   multivitamin with minerals  1 tablet Oral Daily   pantoprazole  40 mg Oral BID   sodium chloride HYPERTONIC  4 mL Nebulization BID   Continuous Infusions:  ampicillin-sulbactam (UNASYN) IV 3 g (03/04/23 0549)   PRN Meds: acetaminophen **OR** acetaminophen, albuterol, ondansetron **OR** ondansetron (ZOFRAN) IV, senna-docusate   Vital Signs    Vitals:   03/04/23 0430 03/04/23 0500 03/04/23 0734 03/04/23 0812  BP: 106/60   115/61  Pulse: 88   91  Resp: 18   17  Temp: 98.4 F (36.9 C)   (!) 97.5 F (36.4 C)  TempSrc:      SpO2: 96%  96% 100%  Weight:  64.5 kg    Height:        Intake/Output Summary (Last 24 hours) at 03/04/2023 1023 Last data filed at 03/04/2023 1006 Gross per 24 hour  Intake 330 ml  Output 550 ml  Net -220 ml      03/04/2023    5:00 AM 02/22/2023    7:10 PM 02/17/2023    1:13 PM  Last 3 Weights  Weight (lbs) 142 lb 3.2 oz 134 lb 7.7 oz 134 lb 9.6 oz  Weight (kg) 64.5 kg 61 kg 61.054 kg      Telemetry    Runs NSVT, Sinus rhythm, PVCs - Personally Reviewed  ECG    No new - Personally Reviewed  Physical Exam   Vitals:   03/04/23 0734 03/04/23 0812  BP:  115/61  Pulse:  91  Resp:  17  Temp:  (!)  97.5 F (36.4 C)  SpO2: 96% 100%    GEN: mild distress Neck: No sig JVD Cardiac: RRR, no murmurs, rubs, or gallops.  Respiratory: wheezing throughout GI: non-distended  MS: No edema; No deformity. Neuro:  Nonfocal  Psych: Normal affect   Labs    High Sensitivity Troponin:   Recent Labs  Lab 02/27/2023 1909 03/09/2023 2114  TROPONINIHS 19* 17     Chemistry Recent Labs  Lab 03/02/23 0725 03/03/23 0639 03/04/23 0358  NA 131* 130* 130*  K 4.2 3.8 4.3  CL 97* 91* 90*  CO2 26 24 26   GLUCOSE 167* 166* 153*  BUN 20 21 35*  CREATININE 0.95 1.06* 1.44*  CALCIUM 8.2* 8.5* 8.2*  MG 2.3 2.3 2.6*  PROT 5.8* 6.3* 5.9*  ALBUMIN 2.7* 3.6 3.1*  AST 24 35 4,961*  ALT 18 26 3,379*  ALKPHOS 62 64 84  BILITOT 0.6 1.0 1.4*  GFRNONAA >60 55* 38*  ANIONGAP 8 15 14     Lipids  Recent Labs  Lab 03/02/23 0725  CHOL 125  TRIG 45  HDL 62  LDLCALC 54  CHOLHDL 2.0    Hematology Recent Labs  Lab 03/02/23 0725 03/03/23 0639 03/04/23 0358  WBC 10.9* 9.3 10.3  RBC 3.98 3.56* 3.52*  HGB 10.5* 9.6* 9.4*  HCT 33.7* 30.2* 30.0*  MCV 84.7 84.8 85.2  MCH 26.4 27.0 26.7  MCHC 31.2 31.8 31.3  RDW 18.6* 18.6* 18.8*  PLT 225 191 213   Thyroid No results for input(s): "TSH", "FREET4" in the last 168 hours.  BNP Recent Labs  Lab 03/21/2023 1909  BNP 889.7*    DDimer No results for input(s): "DDIMER" in the last 168 hours.   Radiology    CT CHEST WO CONTRAST  Result Date: 03/03/2023 CLINICAL DATA:  Poor respiratory status EXAM: CT CHEST WITHOUT CONTRAST TECHNIQUE: Multidetector CT imaging of the chest was performed following the standard protocol without IV contrast. RADIATION DOSE REDUCTION: This exam was performed according to the departmental dose-optimization program which includes automated exposure control, adjustment of the mA and/or kV according to patient size and/or use of iterative reconstruction technique. COMPARISON:  Chest x-ray 03/02/2023, CT chest 02/27/2023, 11/10/2022,  05/21/2022, 09/08/2021, 08/12/2017 FINDINGS: Cardiovascular: Limited evaluation without intravenous contrast. Moderate aortic atherosclerosis. No aneurysm. Coronary vascular calcification. Cardiomegaly. Small pericardial effusion. Mediastinum/Nodes: Midline trachea. No suspicious thyroid mass. No increasing adenopathy. Esophagus contains contrast. Lungs/Pleura: Small moderate left pleural effusion. This is similar in size compared to prior CT. Moderate right pleural effusion appears slightly decrease, though with right apical fluid present. Right parahilar consolidation and bronchiectasis corresponding to history of radiation fibrosis. Right apical consolidation, stable with recent priors, some progression since more remote CT from 2022. Interim development of diffuse heterogeneous ground-glass density and septal thickening throughout the left hemithorax. Small focus of consolidation in the left lower lobe. Upper Abdomen: Limited by streak artifact from enteral contrast. Musculoskeletal: Stable lytic lesion at sternum. No acute osseous abnormality. IMPRESSION: 1. Similar small moderate left pleural effusion though with interim development of widespread heterogeneous ground-glass density throughout the left thorax with mild septal thickening, findings either due to asymmetric pulmonary edema versus diffuse left lung pneumonia. 2. Similar to slightly decreased moderate right pleural effusion with apical capping. Fibrosis and bronchiectasis in the right parahilar lung, stable as compared with recent priors but with slight increased apical consolidation compared to more remote priors. This could be due to evolved post treatment change but continued close CT follow-up recommended to exclude recurrent tumor. 3. Cardiomegaly with similar sized small right pleural effusion. Aortic Atherosclerosis (ICD10-I70.0). Electronically Signed   By: [name] M.D.   On: 03/03/2023 23:45   DG Swallowing Func-Speech  Pathology  Result Date: 03/03/2023 Table formatting from the original result was not included. Modified Barium Swallow Study Patient Details Name: [name] MRN: [id number] Date of Birth: 05-17-1946 Today's Date: 03/03/2023 HPI/PMH: HPI: Pt is a 76-y/o female who presented secondary to nausea and vomiting and worsening cough over three days. CT chest 5/6:  New moderate right and small left layering pleural effusions, interval increase in chronic pericardial effusion also. No findings of acute pulmonary edema. Small increased right lower lobe anterior infrahilar consolidation which could be atelectasis or a small pneumonia. Pt evaluated by SLP 5/6; swallow function appeared WNL and a regular texture diet with thin liquids was recommended without need for follow up. SLP re-consulted 5/10 with report that "Pt coughs excessively when eating or drinking. Dry heaves from coughing." PMH: Takotsubo syndrome, hypothyroidism, CHF, COPD, anxiety and depression, DM Type 2, dementia, HLD, HTN, h/o small cell lung cancer,  and h/o CVA.  She has a chronic cough since her lung cancer treatment/radiation. Clinical Impression: Clinical Impression: Pt presents with oropharyngeal dysphagia characterized by a pharyngeal delay, and reduction in bolus cohesion, hyolaryngeal elevation, and anterior laryngeal excursion. She demonstrated escape of liquid boluses to the oral cavity floor, premature spillage, intermittently incomplete epiglottic inversion, and residue in the valleculae, and pyriform sinuses. Residue was improved with secondary swallows and/or a liquid wash. Penetration (PAS 3,5) was noted with thin liquids and to a less severe extent (i.e., PAS 3) with nectar thick, and honey thick liquids. Silent aspiration (PAS 8) was observed with thin liquids. Repeated instances of aspiration appeared to ultimately be sensed when pt was returned to her bed after the study as evidenced by significant coughing. A chin tuck posture  eliminated aspiration and improved laryngeal invasion of thin liquids to penetration (PAS 3), but prompted coughing was ineffective. With nectar thick liquids, a chin tuck posture eliminated dysfunctional penetration, improving it to PAS 2 (considered WNL). A dysphagia 3 diet with nectar thick liquids is recommended with observance of swallowing precautions including a chin tuck posture. Pt verbalized understanding and agreement regarding recommendations, but SLP anticipates that full supervision will be necessary to ensure consistent observance of precautions. SLP will follow pt for treatment. Factors that may increase risk of adverse event in presence of aspiration Rubye Oaks & Clearance Coots 2021): Factors that may increase risk of adverse event in presence of aspiration Rubye Oaks & Clearance Coots 2021): Respiratory or GI disease; Reduced cognitive function Recommendations/Plan: Swallowing Evaluation Recommendations Swallowing Evaluation Recommendations Recommendations: PO diet PO Diet Recommendation: Dysphagia 3 (Mechanical soft); Mildly thick liquids (Level 2, nectar thick) Liquid Administration via: Cup; Straw Medication Administration: Crushed with puree (or whole with nectar thick liquids) Supervision: Patient able to self-feed; Full supervision/cueing for swallowing strategies Swallowing strategies  : Slow rate; Small bites/sips; Chin tuck Postural changes: Position pt fully upright for meals Oral care recommendations: Oral care BID (2x/day) Caregiver Recommendations: Avoid jello, ice cream, thin soups, popsicles Treatment Plan Treatment Plan Treatment recommendations: Therapy as outlined in treatment plan below Follow-up recommendations: -- (TBS) Functional status assessment: Patient has had a recent decline in their functional status and demonstrates the ability to make significant improvements in function in a reasonable and predictable amount of time. Treatment frequency: Min 2x/week Treatment duration: 2 weeks  Interventions: Trials of upgraded texture/liquids; Diet toleration management by SLP; Compensatory techniques Recommendations Recommendations for follow up therapy are one component of a multi-disciplinary discharge planning process, led by the attending physician.  Recommendations may be updated based on patient status, additional functional criteria and insurance authorization. Assessment: Orofacial Exam: Orofacial Exam Oral Cavity - Dentition: Edentulous; Dentures, not available Anatomy: Anatomy: WFL Boluses Administered: Boluses Administered Boluses Administered: Thin liquids (Level 0); Mildly thick liquids (Level 2, nectar thick); Moderately thick liquids (Level 3, honey thick); Solid; Puree  Oral Impairment Domain: Oral Impairment Domain Lip Closure: No labial escape Tongue control during bolus hold: Escape to lateral buccal cavity/floor of mouth Bolus preparation/mastication: Timely and efficient chewing and mashing Bolus transport/lingual motion: Brisk tongue motion Oral residue: Trace residue lining oral structures Location of oral residue : Floor of mouth Initiation of pharyngeal swallow : Pyriform sinuses; Posterior laryngeal surface of the epiglottis  Pharyngeal Impairment Domain: Pharyngeal Impairment Domain Soft palate elevation: No bolus between soft palate (SP)/pharyngeal wall (PW) Laryngeal elevation: Partial superior movement of thyroid cartilage/partial approximation of arytenoids to epiglottic petiole Anterior hyoid excursion: Partial anterior movement Epiglottic movement: Partial inversion Laryngeal vestibule closure:  Incomplete, narrow column air/contrast in laryngeal vestibule Pharyngeal stripping wave : Present - diminished Pharyngeal contraction (A/P view only): N/A Pharyngoesophageal segment opening: Complete distension and complete duration, no obstruction of flow Tongue base retraction: Narrow column of contrast or air between tongue base and PPW Pharyngeal residue: Collection of residue  within or on pharyngeal structures Location of pharyngeal residue: Valleculae; Pyriform sinuses  Esophageal Impairment Domain: Esophageal Impairment Domain Esophageal clearance upright position: Esophageal retention Pill: Esophageal Impairment Domain Esophageal clearance upright position: Esophageal retention Penetration/Aspiration Scale Score: Penetration/Aspiration Scale Score 1.  Material does not enter airway: Puree; Solid; Pill 3.  Material enters airway, remains ABOVE vocal cords and not ejected out: Thin liquids (Level 0); Mildly thick liquids (Level 2, nectar thick); Moderately thick liquids (Level 3, honey thick) 5.  Material enters airway, CONTACTS cords and not ejected out: Thin liquids (Level 0) 8.  Material enters airway, passes BELOW cords without attempt by patient to eject out (silent aspiration) : Thin liquids (Level 0) Compensatory Strategies: Compensatory Strategies Compensatory strategies: Yes Multiple swallows: Effective Effective Multiple Swallows: Puree; Solid Chin tuck: Effective; Ineffective Effective Chin Tuck: Mildly thick liquid (Level 2, nectar thick) Ineffective Chin Tuck: Thin liquid (Level 0) Liquid wash: Effective Effective Liquid Wash: Puree; Solid   General Information: Caregiver present: No  Diet Prior to this Study: NPO   Temperature : Normal   Respiratory Status: Tachypneic   Supplemental O2: Nasal cannula   History of Recent Intubation: No  Behavior/Cognition: Alert; Cooperative; Pleasant mood Self-Feeding Abilities: Able to self-feed Baseline vocal quality/speech: Normal Volitional Cough: Able to elicit Volitional Swallow: Able to elicit Exam Limitations: No limitations Goal Planning: Prognosis for improved oropharyngeal function: Good Barriers to Reach Goals: Cognitive deficits No data recorded Patient/Family Stated Goal: none stated No data recorded Pain: Pain Assessment Pain Assessment: No/denies pain Faces Pain Scale: 2 Pain Location: generalized Pain Descriptors /  Indicators: Discomfort Pain Intervention(s): Monitored during session End of Session: Start Time:SLP Start Time (ACUTE ONLY): 1405 Stop Time: SLP Stop Time (ACUTE ONLY): 1420 Time Calculation:SLP Time Calculation (min) (ACUTE ONLY): 15 min Charges: SLP Evaluations $ SLP Speech Visit: 1 Visit SLP Evaluations $BSS Swallow: 1 Procedure $MBS Swallow: 1 Procedure SLP visit diagnosis: SLP Visit Diagnosis: Dysphagia, pharyngeal phase (R13.13) Past Medical History: Past Medical History: Diagnosis Date  Acute renal failure (HCC)   Age-related nuclear cataract of both eyes 2016  +cortical age related cataracts OU   Anxiety and depression   Bilateral diabetic retinopathy (HCC) 2015  Dr. 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  Colonic diverticular abscess   COPD (chronic obstructive pulmonary disease) (HCC)   CVA (cerebral vascular accident) (HCC) 09/2016  MRI did show a left-sided small ischemic stroke which was acute but likely incidental (MRI was done b/c pt had TIA sx's in hospital).  Neuro put her on plavix at that time.  Cardiology d/c'd her plavix 08/2017.  Diabetes mellitus with complication (HCC)   diab retinopathy OU (laser)  Diverticulitis of large intestine with perforation and abscess 09/11/2016  Finger fracture, right 04/2020  R 5th metacarpal fx at the base (Dr. Dion Saucier)  GERD (gastroesophageal reflux disease)   protonix  History of concussion 08/25/2018  w/out loss of consciousness--mild increase in baseline memory impairment after.  CT head neg acute.  History of diverticulitis of colon   with abscess;  required IR percutaneous drain placed 09/2016.  History of iron deficiency anemia   started iron 07/02/19.  EGD/colonoscopy 08/27/19 unrevealing. Capsule endoscopy considered but never done.  Hyperlipidemia   Hypertension   Left bundle  Ryosuke Ericksen block (LBBB) 12/16/2016  Lung cancer (HCC)   non-small cell lung ca, stage III in 05/2011; systemic chemotherapy concurrent with radiation followed by prophylactic cranial irradiation and has been observation since July of 2010 with no evidence for disease recurrence-released from onc f/u 12/2014 (needs annual cxr by PCP).  CXR 08/2015 stable.  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 Shirline Frees did bx and this was NEG for malignancy.  Mild cognitive impairment with memory loss   Likely from brain radiation therapy  Non-obstructive CAD (coronary artery disease)   a. Cath 2006 preserved LV fxn, scattered irregularities without critical stenosis; b. 2008 stress echo negative for ischemia, but with hypertensive response; c. 08/2016 Cath: D1 25%, otw nl.  Normocytic anemia 2018  Iron, vit B12, folate all normal 12/2016.  Open-angle glaucoma 2016  Dr. Hanley Seamen, (bilateral)---responding to topical therapy  Osteoarthritis of both hands   Osteoporosis 03/2016; 03/2018  03/2016 DEXA T-score -2.2.  03/2018 T-score L femoral neck  -2.7--boniva started. DEXA 04/2021 T scor -2.6->boniva continued. Plan rpt DEXA 2 yrs.  Pneumonia   hospitalization 11/2016; hypoxemic resp failure--d/c'd home with home oxygen therapy  Pulmonary nodule, left 08/2021  LUL 11 mm ground glass, needs f/u CT 08/2022.  Seasonal allergic rhinitis   Subclinical hyperthyroidism 2018  Takotsubo cardiomyopathy   a. 08/2016 Echo: EF 30-35%, gr1DD, PASP ;  b. 08/2016 Cath: nonobs Dzs;  c. 11/2016 Echo: EF 40-45%, no rwma, nl RV fxn, mild TR, PASP .  ECHO 02/2017 EF 50-55%, grd II DD---essentially resolved Takotsubo 02/2017.  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 08/2016 in setting of diverticulitis & pneumoperitoneum and Takotsubo CM -->prolonged QT, seen by EP-->avoid meds with potential for QT prolongation. Past Surgical History: Past Surgical History: Procedure Laterality Date  ABDOMINAL HYSTERECTOMY  1997  APPENDECTOMY    CARDIAC CATHETERIZATION N/A  09/14/2016  Procedure: Left Heart Cath and Coronary Angiography;  Surgeon: Lennette Bihari, MD;  Location: [facility]VASIVE CV [facility];  Service: Cardiovascular; Mild nonobstructive CAD with 85% smooth narrowing in the first diagonal Dawsyn Zurn of the LAD; 10% smooth narrowing of the ostial proximal left circumflex coronary artery; and a normal dominant RCA.  CARPAL TUNNEL RELEASE    CATARACT EXTRACTION Bilateral   CHOLECYSTECTOMY  2002  COLONOSCOPY  10/24/00; 08/27/19  2002 normal.  [employer] hemoccult testing via [facility] 06/18/15 was NEG.  2020->diverticulosis o/w nl.  dexa  03/2016; 03/2018  03/2018 T-score L femoral neck  -2.7 (worsened compared to osteopenic range 2017.) DEXA 05/03/21 stable T score -2.6 on boniva x 2 yrs->plan rpt DEXA 2 yrs.  ESOPHAGOGASTRODUODENOSCOPY  08/27/2019  Normal.  IR GENERIC HISTORICAL  09/19/2016  IR SINUS/FIST TUBE CHK-NON GI 09/19/2016 Gilmer Mor, DO [facility]  IR GENERIC HISTORICAL  10/06/2016  IR RADIOLOGIST EVAL & MGMT 10/06/2016 [facility]  IR GENERIC HISTORICAL  10/26/2016  IR RADIOLOGIST EVAL & MGMT 10/26/2016 [facility]  IR GENERIC HISTORICAL  11/03/2016  IR RADIOLOGIST EVAL & MGMT 11/03/2016 Gershon Crane, PA-C [facility]  IR GENERIC HISTORICAL  11/24/2016  IR RADIOLOGIST EVAL & MGMT 11/24/2016 Gershon Crane, PA-C [facility]  IR GENERIC HISTORICAL  12/05/2016  Fistula smaller/improving.  IR SINUS/FIST TUBE CHK-NON GI 12/05/2016 Simonne Come, MD [facility]  IR GENERIC HISTORICAL  12/22/2016  IR RADIOLOGIST EVAL & MGMT 12/22/2016 Berdine Dance, MD [facility]  IR RADIOLOGIST EVAL & MGMT  01/10/2017  IR RADIOLOGIST EVAL & MGMT  02/09/2017  IR SINUS/FIST TUBE CHK-NON GI  03/28/2017  LEFT HEART CATHETERIZATION WITH CORONARY ANGIOGRAM N/A 05/30/2012  Procedure: LEFT HEART CATHETERIZATION WITH CORONARY ANGIOGRAM;  Surgeon: Kathleene Hazel, MD;  Location: [facility] CATH LAB;  Service: Cardiovascular: Mild, non-obstructive CAD  TRANSTHORACIC ECHOCARDIOGRAM   09/14/2016; 11/2016  08/2016: EF 30-35 %, Akinesis of the mid-apicalanteroseptal myocardium.  Grade I DD.  Mild pulm HTN.  Repeat echo 11/2016, EF 40-45%, no rwma, nl RV fxn, mild TR, PASP .    TRANSTHORACIC ECHOCARDIOGRAM  03/07/2017  EF 50-55%. Hypokinesis of the distal septum with overall low normal LV,  systolic function; mild diastolic dysfunction with elevated LV  filling pressure; mildly calcified aortic valve with mild AI; small pericardial effusion  TRANSTHORACIC ECHOCARDIOGRAM    04/2022 EF 60-65%, no WMA, no valve probs  Zio patch  11/2022  3 day:  primarily sinus, avg HR 97, brief run SVT, rare PVC Shanika I. Vear Clock, MS, CCC-SLP Neuro Diagnostic Specialist [facility] Office number: [phone] Scheryl Marten 03/03/2023, 3:23 PM  DG CHEST PORT 1 VIEW  Result Date: 03/02/2023 CLINICAL DATA:  Shortness breath EXAM: PORTABLE CHEST 1 VIEW COMPARISON:  Chest x-ray dated Feb 26, 2023 FINDINGS: Cardiac and mediastinal contours are unchanged. Stable posttreatment changes of the right hemithorax. Unchanged small right pleural effusion. New heterogeneous opacities of the left hemithorax. New evidence of pneumothorax. IMPRESSION: 1. New heterogeneous opacities of the left hemithorax, concerning for infection or asymmetric pulmonary edema. 2. Unchanged posttreatment changes of the right hemithorax and small right pleural effusion. Electronically Signed   By: Allegra Lai M.D.   On: 03/02/2023 13:54    Cardiac Studies   Echo 02/27/23: 1. Left ventricular ejection fraction, by estimation, is 20%. The left  ventricle has severely decreased function. The left ventricle demonstrates  regional wall motion abnormalities with global hypokinesis with relatively  preserved basal lateral and  basal anterior wall motion. There was septal-lateral dyssynchrony  consistent with IVCD. No LV thrombus noted. Left ventricular diastolic  parameters are consistent with Grade II diastolic  dysfunction  (pseudonormalization).   2. IVC not visualized. Right ventricular systolic function is normal. The  right ventricular size is normal. Tricuspid regurgitation signal is  inadequate for assessing PA pressure.   3. Left atrial size was mildly dilated.   4. The mitral valve is abnormal. Moderate mitral valve regurgitation. No  evidence of mitral stenosis. Moderate mitral annular calcification.   5. The aortic valve is tricuspid. There is moderate calcification of the  aortic valve. Aortic valve regurgitation is mild. Mild aortic valve  stenosis. Aortic valve area, by VTI measures 1.75 cm. Aortic valve mean  gradient measures 14.0 mmHg.   6. A small pericardial effusion is present. The pericardial effusion is  circumferential.   Patient Profile     Ms. Charleston is a 75F with Takotsubo cardiomyopathy with recovered LVEF, non-obstructive CAD, LBBB, orthostatic hypotension, COPD, lung cancer s/p XRT, and cardiac arrest in the setting of torsades admitted with cough and wheezing. Cardiology consulted for heart failure.   Assessment & Plan    COPD exacerbation: per primary team. Main issue. Would consider continuing steroids  Systolic CHF: global hypokinesis. C/f stress cardiomyopathy. Crt bumped to 1.44, likely over diuresing -will give diuresis holiday, and start lasix 40 mg daily tomorrow -Bps on soft side; has  orthostatic hypotension, GFR fluctuating , above 30. Wont be too aggressive with GDMT -start jardiance 10 mg daily -continue metop XL 25 mg daily -consider entresto/spironolactone as an outpatient if BP can tolerate  CAD - continue asa and statin  NSVT/ hx of torsades - K>4, Mg>2  Will follow peripherally over the weekend, unlikely significant changes      For questions or updates, please contact [facility] Please consult [email or web address] for contact info under        Signed, Maisie Fus, MD  03/04/2023, 10:23 AM

## 2023-03-04 NOTE — Consult Note (Incomplete)
Palliative Medicine Inpatient Consult Note  Consulting Provider: Dr. Joseph Art  Reason for consult:   Palliative Care Consult Services Palliative Medicine Consult  Reason for Consult? See below   Patient with multiple medical conditions, slowly deteriorating consider changing CODE STATUS to DNR, hospice   03/04/2023  HPI:  Per intake H&P --> 77 year old woman with a history of COPD, diabetes, mild cogn impairment, cerebrovascular disease, chronic fatigue, chronic kidney disease, prior takotsubo's, limited stage small cell lung cancer. Admitted in the setting of rhinovirus. Identified to have L sided PNA and volume overload from Takotsubos cardiomyopathy.   Palliative care has been asked to support further goals of care conversations.   Clinical Assessment/Goals of Care:  *Please note that this is a verbal dictation therefore any spelling or grammatical errors are due to the "[employer]agon [employer] One" system interpretation.  I have reviewed medical records including EPIC notes, labs and imaging, received report from bedside RN, assessed the patient.    I met with *** to further discuss diagnosis prognosis, GOC, EOL wishes, disposition and options.   I introduced Palliative Medicine as specialized medical care for people living with serious illness. It focuses on providing relief from the symptoms and stress of a serious illness. The goal is to improve quality of life for both the patient and the family.  Medical History Review and Understanding:    Social History:    Functional and Nutritional State:    Palliative Symptoms:    Advance Directives: A detailed discussion was had today regarding advanced directives.    Code Status: Concepts specific to code status, artifical feeding and hydration, continued IV antibiotics and rehospitalization was had.  The difference between a aggressive medical intervention path  and a palliative comfort care path for this patient at this time was  had.   Encouraged patient/family to consider DNR/DNI status understanding evidenced based poor outcomes in similar hospitalized patient, as the cause of arrest is likely associated with advanced chronic/terminal illness rather than an easily reversible acute cardio-pulmonary event. I explained that DNR/DNI does not change the medical plan and it only comes into effect after a person has arrested (died).  It is a protective measure to keep [place] from harming the patient in their last moments of life. *** was agreeable to DNR/DNI with understanding that patient would not receive CPR, defibrillation, ACLS medications, or intubation.   Discussion:    Discussed the importance of continued conversation with family and their  medical providers regarding overall plan of care and treatment options, ensuring decisions are within the context of the patients values and GOCs.  Provided *** "Hard Choices for Loving People" booklet.   Provided *** "Gone From My Site" booklet.  Decision Maker:  SUMMARY OF RECOMMENDATIONS    Code Status/Advance Care Planning: FULL CODE  DNAR/DNI  Modified CODE   Symptom Management:   Palliative Prophylaxis:  Aspiration, Bowel Regimen, Delirium Protocol, Frequent Pain Assessment, Oral Care, Palliative Wound Care, and Turn Reposition  Additional Recommendations (Limitations, Scope, Preferences): Avoid Hospitalization, Full Scope Treatment, No Artificial Feeding, No Surgical Procedures, and No Tracheostomy  Psycho-social/Spiritual:  Desire for further Chaplaincy support:  Additional Recommendations:    Prognosis:   Discharge Planning:   ROS  Oral Intake %:   I/O:   Bowel Movements:   Mobility:   Vitals:   03/04/23 1327 03/04/23 1701  BP:  (!) 93/37  Pulse:  85  Resp:  16  Temp:  (!) 97.4 F (36.3 C)  SpO2: 98% 100%  Intake/Output Summary (Last 24 hours) at 03/04/2023 1749 Last data filed at 03/04/2023 1703 Gross per 24 hour  Intake 389 ml   Output 450 ml  Net -61 ml   Last Weight  Most recent update: 03/04/2023  5:46 AM    Weight  64.5 kg (142 lb 3.2 oz)             Gen:  NAD HEENT: moist mucous membranes CV: Regular rate and rhythm, no murmurs rubs or gallops PULM: clear to auscultation bilaterally. No wheezes/rales/rhonchi*** ABD: soft/nontender/nondistended/normal bowel sounds*** EXT: No edema*** Neuro: Alert and oriented x3***  PPS:   This conversation/these recommendations were discussed with patient primary care team, Dr. Marland Kitchen  Time In: Time Out: Total Time: ***  Billing based on MDM: ***  {Problems Addressed:304933}  {Amount and/or Complexity of UEAV:409811}  {Risks:304936} ______________________________________________________ Lamarr Lulas [place] Palliative Medicine Team Team Cell Phone: [phone] Please utilize secure chat with additional questions, if there is no response within 30 minutes please call the above phone number  Palliative Medicine Team providers are available by phone from 7am to 7pm daily and can be reached through the team cell phone.  Should this patient require assistance outside of these hours, please call the patient's attending physician.

## 2023-03-04 NOTE — Progress Notes (Addendum)
[name] ZDG:[id number] DOB: August 27, 1946 DOA: 03/07/2023 PCP: [name], MD   Subj:   77 yo WF PMHx depression and dementia, Non ischemic, stress induced cardiomyopathy, HTN, COPD, DM type II,,  small cell lung cancer s/p XRT, Hx of CVA,    Presented with dyspnea. Patient reported 3 days of worsening cough with increase sputum production, persistent despite use of nebulizer treatments at home. On the day of hospitalization she developed wheezing, and respiratory distress. EMS was called, patient was placed on Cpap and transported to the ED. Note that she vomited while on Cpap and then this device was removed. On her initial physical examination in the ED her blood pressure was 113/75, HR 113, RR 16 and 02 saturation 99%, her lungs had coarse breath sounds, and positive wheezing, heart with S1 and S2 present and rhythmic, abdomen with no distention and no lower extremity edema.    Na 126, K 4,5 CL 91, bicarbonate 23, glucose 154, bun 10 cr 1.0 BNP 889 High sensitive troponin 19  Wbc 8,6 hgb 12.1 plt 282  Sars covid 19 negative    CXR with hyperinflation, right upper love scarring, right sided pleural effusion.   CT chest with bilateral ground glass opacities, right upper lobe scarring. Right pleural effusion, interval increase in chronic pericardial effusion. Small increased right lower lobe anterior infrahilar consolidation. 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    EKG 111 bpm, normal axis, left bundle branch block, qtc less than 500, sinus rhythm with PAC with aberrancy, q wave V1 to V3, no significant ST segment or T wave changes.    Obj: 5/11 afebrile overnight positive SOB but improved from 5/10, negative CP, positive fatigue but improved from 5/10 negative nausea, negative vomiting.    Objective: VITAL SIGNS: Temp: 97.5 F (36.4 C) (05/11 0812) BP: 115/61 (05/11 0812) Pulse Rate: 91 (05/11 0812)   VENTILATOR SETTINGS: Room air 5/11 SpO2=  96%    Procedures/Significant Events: 5/6 CT chest W contrast 1. New moderate right and small left layering pleural effusions, interval increase in chronic pericardial effusion also. No findings of acute pulmonary edema. 2. Small increased right lower lobe anterior infrahilar consolidation which could be atelectasis or a small pneumonia. 3. Small new areas of ground-glass disease in the apex of the left upper lobe which are probably inflammatory/infectious. 4. Diffusely thickened bronchi. 5. Slightly prominent single left mid hilar lymph node is likely reactive. 6. 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 7. Aortic and coronary artery atherosclerosis. 8. Prominent left ventricular cavity, which may be seen with cardiomyopathy and prior ischemic disease. 9. Chronic soft tissue thickening in the subcarinal space and along the right mid to upper hilum which were seen previously and also compatible with XRT.  5/10 CT chest W0 contrast . Similar small moderate left pleural effusion though with interim development of widespread heterogeneous ground-glass density throughout the left thorax with mild septal thickening, findings either due to asymmetric pulmonary edema versus diffuse left lung pneumonia. 2. Similar to slightly decreased moderate right pleural effusion with apical capping. Fibrosis and bronchiectasis in the right parahilar lung, stable as compared with recent priors but with slight increased apical consolidation compared to more remote priors. This could be due to evolved post treatment change but continued close CT follow-up recommended to exclude recurrent tumor. 3. Cardiomegaly with similar sized small right pleural effusion.    Consultants:  PCCM Dr. Myrla Halsted   Cultures 5/8 positive rhinovirus   Antimicrobials: Anti-infectives (From admission,  onward)    Start     Ordered Stop   03/04/23 1200  meropenem (MERREM) 1 g in sodium chloride  0.9 % 100 mL IVPB        03/04/23 1106     03/04/23 1145  doxycycline (VIBRAMYCIN) 100 mg in sodium chloride 0.9 % 250 mL IVPB        03/04/23 1052     02/27/23 0015  Ampicillin-Sulbactam (UNASYN) 3 g in sodium chloride 0.9 % 100 mL IVPB  Status:  Discontinued        02/27/23 0004 03/04/23 1052            Physical Exam:  General: A/O x 4, positive  acute respiratory distress Eyes: negative scleral hemorrhage, negative anisocoria, negative icterus ENT: Negative Runny nose, negative gingival bleeding, Neck:  Negative scars, masses, torticollis, lymphadenopathy, JVD Lungs: continued decreased breath sounds, rhonchi diffusely, negative crackles, positive expiratory wheeze  Cardiovascular: Regular rate and rhythm without murmur gallop or rub normal S1 and S2 Abdomen: negative abdominal pain, nondistended, positive soft, bowel sounds, no rebound, no ascites, no appreciable mass Extremities: No significant cyanosis, clubbing, or edema bilateral lower extremities Skin: Negative rashes, lesions, ulcers Psychiatric:  Negative depression, negative anxiety, negative fatigue, negative mania  Central nervous system:  Cranial nerves II through XII intact, tongue/uvula midline, all extremities muscle strength 5/5, sensation intact throughout, negative dysarthria, negative expressive aphasia, negative receptive aphasia.   .   DVT prophylaxis: Lovenox  Code Status: Full Family Communication: 5/11 spoke with Alfonse Ras (daughter) discussed plan of care answered all questions Status is: Inpatient    Dispo: The patient is from: Home              Anticipated d/c is to: SNF              Anticipated d/c date is: > 3 days              Patient currently is not medically stable to d/c.      Assessment & Plan: Covid vaccination;   Active Problems:   Chronic obstructive pulmonary disease with acute exacerbation (HCC)   Takotsubo cardiomyopathy   Hyponatremia   Essential hypertension   Dementia  without behavioral disturbance (HCC)   Type 2 diabetes mellitus with hyperlipidemia (HCC)   Malnutrition of moderate degree   Small cell lung cancer (HCC)   Acute on chronic congestive heart failure (HCC)   NSVT (nonsustained ventricular tachycardia) (HCC)   Rhinovirus infection   Bilateral pleural effusion   CKD (chronic kidney disease), stage III (HCC)  COPD with acute exacerbation (HCC)/acute respiratory failure with hypoxia -Continue current antibiotics to complete 7-day course - DuoNeb QID -Hypertonic saline nebs BID x 3 days -Flutter valve - Incentive spirometry - Solu-Medrol 40 mg daily - Mucinex DM BID  CAP Right lower lobe pneumonia/RUL occlusion/Aspiration PNA/positive rhinovirus -5/6 passed swallow evaluation by speech. -5/8 respiratory virus panel pending -5/8 chronic RUL fibrosis, with atelectasis vs pneumonia.  See above -5/8 see COPD -5/9 PCXR consistent with pulmonary edema -5/10 CT chest R/O neoplasm etc. -5/10 per RN Tiney Rouge patient sounding much more wet, increased WOB.  I concur.   -5/10 speech recommends dysphagia 3 with nectar thick -5/11 discussed case with Dr. Myrla Halsted, PCCM.  Does not believe the fibrosis around right superior lobe bronchus amenable to stenting.  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  Which appears to me to have occluded right upper lobe.  Secondary to small cell lung cancer s/p XRT (July 2010) - 5/10 consult PCCM in a.m. doubt there is anything they can do, possibly stent? - 5/10 repeat chest CT increase significant groundglass opacification left lung field see above - 5/11 continue diuresing.  See CHF  Takotsubo cardiomyopathy/Acute on chronic systolic CHF -Echocardiogram LVEF = 20%, no LVH, RV systolic function is preserved, small pericardial effusion, moderate MR, mild aortic  stenosis.  New findings.  Last echocardiogram 05/22/2022 LVEF=60 to 65%  -Dr. Bryan Lemma is patient's cardiologist last seen 4/13 -5/8 strict in and out +902.71ml - 5/8 Daily weight Filed Weights   02/28/2023 1910 03/04/23 0500  Weight: 61 kg 64.5 kg  -5/9 consult cardiology, they will see patient today will await recommendations. - 5/9 patient continues SOB, sounds wet increase Lasix IV 40 mg BID -5/9 Albumin 50 g prior to Lasix -5/10 PureWick - 5/10 increase Lasix IV 40 mg TID -5/11 Lasix held per cardiology secondary to increased creatinine.  CKD stage IIIa (baseline Cr 1.05-1.25) Lab Results  Component Value Date   CREATININE 1.44 (H) 03/04/2023   CREATININE 1.06 (H) 03/03/2023   CREATININE 0.95 03/02/2023   CREATININE 1.01 (H) 03/01/2023   CREATININE 1.02 (H) 03/01/2023    Hyponatremia Lab Results  Component Value Date   NA 130 (L) 03/04/2023   NA 130 (L) 03/03/2023   NA 131 (L) 03/02/2023   NA 131 (L) 03/01/2023   NA 131 (L) 03/01/2023  -Improving   Essential hypertension -See CHF   Dementia without behavioral disturbance (HCC) -5/8 must be mild, A/O x 4 today.     Type 2 diabetes mellitus controlled with HLD (HCC) -4/26 hemoglobin A1c= 6.5 - Moderate SSI CBG (last 3)  Recent Labs    03/04/23 0006 03/04/23 0430 03/04/23 0743  GLUCAP 165* 139* 136*    HLD - 5/9 lipid panel; LDL= 54.  Within goal - Lipitor 40 mg daily   Malnutrition of moderate degree -Continue with nutritional supplements.   Goals of care - 5/10 palliative care consult: Patient with multiple medical conditions, slowly deteriorating consider changing CODE STATUS to DNR, hospice. -5/11 NP Lamarr Lulas palliative care has scheduled meeting with daughter Jodelle Green for 1191 on 5/12      Mobility Assessment (last 72 hours)     Mobility Assessment     Row Name 03/03/23 2150 03/03/23 0920 03/02/23 2147 03/02/23 0946 03/02/23 0930   Does patient have an order for bedrest or  is patient medically unstable No - Continue assessment No - Continue assessment No - Continue assessment -- No - Continue assessment   What is the highest level of mobility based on the progressive mobility assessment? Level 4 (Walks with assist in room) - Balance while marching in place and cannot step forward and back - Complete Level 4 (Walks with assist in room) - Balance while marching in place and cannot step forward and back - Complete Level 4 (Walks with assist in room) - Balance while marching in place and cannot step forward and back - Complete Level 4 (Walks with assist in room) - Balance while marching in place and cannot step forward and back - Complete Level 4 (Walks with assist in room) - Balance while marching in place and cannot step forward and back - Complete    Row Name 03/01/23 2230           Does patient have an order for bedrest  or is patient medically unstable No - Continue assessment       What is the highest level of mobility based on the progressive mobility assessment? Level 5 (Walks with assist in room/hall) - Balance while stepping forward/back and can walk in room with assist - Complete                     Time: 50 minutes         Care during the described time interval was provided by me .  I have reviewed this patient's available data, including medical history, events of note, physical examination, and all test results as part of my evaluation.

## 2023-03-04 NOTE — Progress Notes (Signed)
   Palliative Medicine Inpatient Follow Up Note  The Palliative care team has acknowledged the consultation for Medina Kennemore.  Patients daughter and [place]on Gully, Jodelle Green has been called and agrees to meet tomorrow morning at 10:30AM.   No Charge ______________________________________________________________________________________ Lamarr Lulas [facility] Palliative Medicine Team Team Cell Phone: [phone] Please utilize secure chat with additional questions, if there is no response within 30 minutes please call the above phone number  Palliative Medicine Team providers are available by phone from 7am to 7pm daily and can be reached through the team cell phone.  Should this patient require assistance outside of these hours, please call the patient's attending physician.

## 2023-03-04 NOTE — Progress Notes (Signed)
Pharmacy Antibiotic Note  [name] is a 77 y.o. female admitted on 02/23/2023 with new concern for hospital acquired pneumonia.  Pharmacy has been consulted for meropenem dosing.  Per MD, she has developed extensive left-sided infiltrates thought to be either an aspiration event or hospital acquired pneumonia. She has a history of limited stage small cell lung cancer (diagnosed in July 2010) and was treated with chemoradiation as well as prophylactic cranial radiation. She has been in remission since. She has a chronic cough thought to be related to the radiation.   She is afebrile with a normal white blood cell count at 10.3k today. She is on room air. Her respiratory panel screen was positive for rhino/enterovirus.   Plan: Start meropenem 1g q12 hours, monitor closely for improvements in renal function warranting a dose increase Start doxycycline 100mg  BID per MD  Monitor for signs and symptoms of infection   Height: 5' (152.4 cm) Weight: 64.5 kg (142 lb 3.2 oz) IBW/kg (Calculated) : 45.5  Temp (24hrs), Avg:97.9 F (36.6 C), Min:97.5 F (36.4 C), Max:98.4 F (36.9 C)  Recent Labs  Lab 02/28/23 0629 03/01/23 0336 03/01/23 0825 03/02/23 0725 03/03/23 0639 03/04/23 0358  WBC 12.2* 10.9*  --  10.9* 9.3 10.3  CREATININE 1.08* 1.02* 1.01* 0.95 1.06* 1.44*    Estimated Creatinine Clearance: 28.7 mL/min (A) (by C-G formula based on SCr of 1.44 mg/dL (H)).    Allergies  Allergen Reactions   Lactose Intolerance (Gi) Diarrhea   Motrin [Ibuprofen] Itching, Anxiety and Other (See Comments)    Hyperventilates     Antimicrobials this admission: Unasyn 5/6 > 5/11   Microbiology results: 02/27/2023 BCx: NG x 4 days 03/01/2023 Respiratory Panel: + rhino/enterovirus   COVID negative   Thank you for allowing pharmacy to be a part of this patient's care.  Blane Ohara, PharmD  PGY1 Pharmacy Resident

## 2023-03-04 NOTE — Consult Note (Signed)
NAME:  [name], MRN:  [id number], DOB:  [birth year], LOS: 6 ADMISSION DATE:  03/16/2023, CONSULTATION DATE:  03/04/23 REFERRING MD:  Joseph Art, CHIEF COMPLAINT:  SOB   History of Present Illness:  This is a 77 year old woman with a history of COPD, diabetes, mild cogn impairment, cerebrovascular disease, chronic fatigue, chronic kidney disease, prior takotsubo's, limited stage small cell lung cancer (details below) presenting on 02/27/2023 with acute on chronic cough, fatigue, nausea, vomiting.  She is found to be rhinovirus positive.  Workup has revealed suspected recurrent Takotsubo's, volume overload, dysphagia.  Sometime between admission and 5/9, she worsened and x-ray reveals worsening left-sided pneumonia.  Pulmonology is consulted given her complex cardiopulmonary status to assist with management.  Review of systems as below.  Pertinent  Medical History  With regards to her limited stage small cell lung cancer, she was diagnosed in July 2010 and treated with chemoradiation as well as prophylactic cranial radiation.  She has been in remission since.  Only issue since that time have been chronic cough thought related to the radiation.  Recurrent Takotsubo's, see Dr. Herbie Baltimore note from 03/13/2017 for details regarding initial complex presentation  CKD 3  Osteoporosis  Prior stroke incidentally found 2017  Significant Hospital Events: Including procedures, antibiotic start and stop dates in addition to other pertinent events     Interim History / Subjective:  consult  Objective   Blood pressure 115/61, pulse 91, temperature (!) 97.5 F (36.4 C), resp. rate 17, height 5' (1.524 m), weight 64.5 kg, SpO2 100 %.        Intake/Output Summary (Last 24 hours) at 03/04/2023 1026 Last data filed at 03/04/2023 1006 Gross per 24 hour  Intake 330 ml  Output 550 ml  Net -220 ml   Filed Weights   03/21/2023 1910 03/04/23 0500  Weight: 61 kg 64.5 kg    Examination: General:  Ill-appearing pale woman laying in bed HENT: Mucous membranes dry, trachea midline Lungs: She has severe transmitted upper airway sounds consistent with retained pharyngeal secretions.  She also has rhonchi on the left side, she is mildly tachypneic Cardiovascular: Heart sounds regular, extremities warm Abdomen: Soft, positive bowel sounds Extremities: She has extensive bruising from phlebotomy Neuro: She will move all 4 extremities to command but is weak Psych: [facility] Problem list   N/A  Assessment & Plan:  Rhinovirus infection with initial clear x-ray.  Subsequent development of extensive left-sided infiltrates I can only assume is either an aspiration event or an H CAP.  This may explain her lack of progress.  Do not think she has any more volume to give.  Takotsubo's, recurrent, and cardiology following  FTT, poor PO, hypoglycemia  History of right upper lobe Limited stage small cell carcinoma in remission.  Her lung architecture remains markedly abnormal with an essentially nonfunctional right upper lobe.  This is why she is so symptomatic from her left-sided pneumonia.  She already has hypertonic saline, CPT ordered.  She is also on steroids and nebulizers.  She has received a course of Unasyn.  Only thing additional I can offer is broadening her antibiotics.  Will try the quad cough device.  May benefit from NT suctioning and/or NAC nebs although the benefit of this over hypertonic is questionable.  Agree with a palliative care consult.  Best Practice (right click and "Reselect all SmartList Selections" daily)   Per primary team  Labs   CBC: Recent Labs  Lab 03/13/2023 1909 03/01/2023 1922 02/27/23 8295  02/28/23 1610 03/01/23 0336 03/02/23 0725 03/03/23 0639 03/04/23 0358  WBC 8.6  --  7.3 12.2* 10.9* 10.9* 9.3 10.3  NEUTROABS 6.4  --  6.9  --   --  9.8* 8.2* 8.8*  HGB 12.1   < > 11.2* 10.8* 11.3* 10.5* 9.6* 9.4*  HCT 39.1   < > 35.3* 34.1* 34.7*  33.7* 30.2* 30.0*  MCV 86.3  --  84.0 84.4 82.6 84.7 84.8 85.2  PLT 282  --  199 249 233 225 191 213   < > = values in this interval not displayed.    Basic Metabolic Panel: Recent Labs  Lab 02/27/23 0212 02/28/23 0629 03/01/23 0336 03/01/23 0825 03/02/23 0725 03/03/23 0639 03/04/23 0358  NA 126*   < > 131* 131* 131* 130* 130*  K 4.0   < > 4.1 4.2 4.2 3.8 4.3  CL 92*   < > 93* 94* 97* 91* 90*  CO2 23   < > 24 25 26 24 26   GLUCOSE 260*   < > 111* 124* 167* 166* 153*  BUN 9   < > 12 14 20 21  35*  CREATININE 1.07*   < > 1.02* 1.01* 0.95 1.06* 1.44*  CALCIUM 8.2*   < > 8.4* 8.1* 8.2* 8.5* 8.2*  MG 2.2  --   --  2.3 2.3 2.3 2.6*  PHOS  --   --   --  3.3 2.7 3.6 5.8*   < > = values in this interval not displayed.   GFR: Estimated Creatinine Clearance: 28.7 mL/min (A) (by C-G formula based on SCr of 1.44 mg/dL (H)). Recent Labs  Lab 03/01/23 0336 03/02/23 0725 03/03/23 0639 03/04/23 0358  WBC 10.9* 10.9* 9.3 10.3    Liver Function Tests: Recent Labs  Lab 02/22/2023 1909 03/01/23 0825 03/02/23 0725 03/03/23 0639 03/04/23 0358  AST 24 23 24  35 4,961*  ALT 15 16 18 26  3,379*  ALKPHOS 72 56 62 64 84  BILITOT 0.2* 0.4 0.6 1.0 1.4*  PROT 6.6 5.9* 5.8* 6.3* 5.9*  ALBUMIN 3.2* 2.7* 2.7* 3.6 3.1*   No results for input(s): "LIPASE", "AMYLASE" in the last 168 hours. No results for input(s): "AMMONIA" in the last 168 hours.  ABG    Component Value Date/Time   PHART 7.277 (L) 12/02/2016 0314   PCO2ART 30.7 (L) 12/02/2016 0314   PO2ART 61.0 (L) 12/02/2016 0314   HCO3 26.7 03/14/2023 1922   TCO2 27 03/12/2023 1923   ACIDBASEDEF 1.0 03/12/2023 1922   O2SAT 64 02/23/2023 1922     Coagulation Profile: No results for input(s): "INR", "PROTIME" in the last 168 hours.  Cardiac Enzymes: No results for input(s): "CKTOTAL", "CKMB", "CKMBINDEX", "TROPONINI" in the last 168 hours.  HbA1C: Hemoglobin A1C  Date/Time Value Ref Range Status  02/17/2023 01:39 PM 6.5 (A) 4.0 -  5.6 % Final  11/18/2022 10:22 AM 7.4 (A) 4.0 - 5.6 % Final   HbA1c, POC (prediabetic range)  Date/Time Value Ref Range Status  02/17/2023 01:39 PM 6.5 (A) 5.7 - 6.4 % Final  11/18/2022 10:22 AM 7.4 (A) 5.7 - 6.4 % Final   HbA1c, POC (controlled diabetic range)  Date/Time Value Ref Range Status  02/17/2023 01:39 PM 6.5 0.0 - 7.0 % Final  11/18/2022 10:22 AM 7.4 (A) 0.0 - 7.0 % Final   HbA1c POC (<> result, manual entry)  Date/Time Value Ref Range Status  02/17/2023 01:39 PM 6.5 4.0 - 5.6 % Final  11/18/2022 10:22 AM 7.4 4.0 - 5.6 %  Final    CBG: Recent Labs  Lab 03/03/23 2055 03/03/23 2214 03/04/23 0006 03/04/23 0430 03/04/23 0743  GLUCAP 64* 85 165* 139* 136*    Review of Systems:    Positive Symptoms in bold:  Constitutional fevers, chills, weight loss, fatigue, anorexia, malaise  Eyes decreased vision, double vision, eye irritation  Ears, Nose, Mouth, Throat sore throat, trouble swallowing, sinus congestion  Cardiovascular chest pain, paroxysmal nocturnal dyspnea, lower ext edema, palpitations   Respiratory SOB, cough, DOE, hemoptysis, wheezing  Gastrointestinal nausea, vomiting, diarrhea  Genitourinary burning with urination, trouble urinating  Musculoskeletal joint aches, joint swelling, back pain  Integumentary  rashes, skin lesions  Neurological focal weakness, focal numbness, trouble speaking, headaches  Psychiatric depression, anxiety, confusion  Endocrine polyuria, polydipsia, cold intolerance, heat intolerance  Hematologic abnormal bruising, abnormal bleeding, unexplained nose bleeds  Allergic/Immunologic recurrent infections, hives, swollen lymph nodes     Past Medical History:  She,  has a past medical history of Acute renal failure (HCC), Age-related nuclear cataract of both eyes (2016), Anxiety and depression, Bilateral diabetic retinopathy (HCC) (2015), Blood transfusion without reported diagnosis, Chronic renal insufficiency, stage 3 (moderate)  (HCC), 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), Colonic diverticular abscess, COPD (chronic obstructive pulmonary disease) (HCC), CVA (cerebral vascular accident) (HCC) (09/2016), Diabetes mellitus with complication (HCC), Diverticulitis of large intestine with perforation and abscess (09/11/2016), Finger fracture, right (04/2020), GERD (gastroesophageal reflux disease), History of concussion (08/25/2018), History of diverticulitis of colon, History of iron deficiency anemia, Hyperlipidemia, Hypertension, Left bundle branch block (LBBB) (12/16/2016), Lung cancer (HCC), Mild cognitive impairment with memory loss, Non-obstructive CAD (coronary artery disease), Normocytic anemia (2018), Open-angle glaucoma (2016), Osteoarthritis of both hands, Osteoporosis (03/2016; 03/2018), Pneumonia, Pulmonary nodule, left (08/2021), Seasonal allergic rhinitis, Subclinical hyperthyroidism (2018), Takotsubo cardiomyopathy, and Torsades de pointes (HCC) (08/2016).   Surgical History:   Past Surgical History:  Procedure Laterality Date   ABDOMINAL HYSTERECTOMY  1997   APPENDECTOMY     CARDIAC CATHETERIZATION N/A 09/14/2016   Procedure: Left Heart Cath and Coronary Angiography;  Surgeon: Lennette Bihari, MD;  Location: [facility] INVASIVE CV LAB;  Service: Cardiovascular; Mild nonobstructive CAD with 85% smooth narrowing in the first diagonal branch of the LAD; 10% smooth narrowing of the ostial proximal left circumflex coronary artery; and a normal dominant RCA.   CARPAL TUNNEL RELEASE     CATARACT EXTRACTION Bilateral    CHOLECYSTECTOMY  2002   COLONOSCOPY  10/24/00; 08/27/19   2002 normal.  [employer] hemoccult testing via [facility] 06/18/15 was NEG.  2020->diverticulosis o/w nl.   dexa  03/2016; 03/2018   03/2018 T-score L femoral neck  -2.7 (worsened compared to osteopenic range 2017.) DEXA 05/03/21 stable T score -2.6 on boniva x 2 yrs->plan rpt DEXA 2 yrs.   ESOPHAGOGASTRODUODENOSCOPY  08/27/2019   Normal.   IR GENERIC HISTORICAL  09/19/2016    IR SINUS/FIST TUBE CHK-NON GI 09/19/2016 Gilmer Mor, DO [facility]   IR GENERIC HISTORICAL  10/06/2016   IR RADIOLOGIST EVAL & MGMT 10/06/2016 [facility]   IR GENERIC HISTORICAL  10/26/2016   IR RADIOLOGIST EVAL & MGMT 10/26/2016 [facility]   IR GENERIC HISTORICAL  11/03/2016   IR RADIOLOGIST EVAL & MGMT 11/03/2016 Gershon Crane, PA-C [facility] INTER[facility]   IR GENERIC HISTORICAL  11/24/2016   IR RADIOLOGIST EVAL & MGMT 11/24/2016 Gershon Crane, PA-C [facility]   IR GENERIC HISTORICAL  12/05/2016   Fistula smaller/improving.  IR SINUS/FIST TUBE CHK-NON GI 12/05/2016 Simonne Come, MD  [facility]   IR GENERIC HISTORICAL  12/22/2016   IR RADIOLOGIST EVAL & MGMT 12/22/2016 Berdine Dance, MD [facility]   IR RADIOLOGIST EVAL & MGMT  01/10/2017   IR RADIOLOGIST EVAL & MGMT  02/09/2017   IR SINUS/FIST TUBE CHK-NON GI  03/28/2017   LEFT HEART CATHETERIZATION WITH CORONARY ANGIOGRAM N/A 05/30/2012   Procedure: LEFT HEART CATHETERIZATION WITH CORONARY ANGIOGRAM;  Surgeon: Kathleene Hazel, MD;  Location: [facility] CATH LAB;  Service: Cardiovascular: Mild, non-obstructive CAD   TRANSTHORACIC ECHOCARDIOGRAM  09/14/2016; 11/2016   08/2016: EF 30-35 %, Akinesis of the mid-apicalanteroseptal myocardium.  Grade I DD.  Mild pulm HTN.  Repeat echo 11/2016, EF 40-45%, no rwma, nl RV fxn, mild TR, PASP .     TRANSTHORACIC ECHOCARDIOGRAM  03/07/2017   EF 50-55%. Hypokinesis of the distal septum with overall low normal LV,  systolic function; mild diastolic dysfunction with elevated LV  filling pressure; mildly calcified aortic valve with mild AI; small pericardial effusion   TRANSTHORACIC ECHOCARDIOGRAM     04/2022 EF 60-65%, no WMA, no valve probs   Zio patch  11/2022   3 day:  primarily sinus, avg HR 97, brief run SVT, rare PVC     Social History:   reports that she quit smoking about 14 years ago. Her smoking use included cigarettes. She has a 45.00 pack-year smoking  history. She has never used smokeless tobacco. She reports that she does not drink alcohol and does not use drugs.   Family History:  Her family history includes Diabetes in her brother, father, and mother; Heart attack in her brother, brother, and brother; Heart disease in her brother; Heart failure in her mother; Kidney disease in her mother; Pancreatic cancer in her brother. There is no history of Colon cancer, Esophageal cancer, Stomach cancer, or Rectal cancer.   Allergies Allergies  Allergen Reactions   Lactose Intolerance (Gi) Diarrhea   Motrin [Ibuprofen] Itching, Anxiety and Other (See Comments)    Hyperventilates      Home Medications  Prior to Admission medications   Medication Sig Start Date End Date Taking? Authorizing Provider  acetaminophen (TYLENOL) 500 MG tablet Take 1,000 mg by mouth daily as needed for headache.   Yes [provider]  albuterol (PROVENTIL) (2.5 MG/3ML) 0.083% nebulizer solution USE 1 VIAL IN NEBULIZER EVERY 4 HOURS AS NEEDED FOR WHEEZING AND FOR SHORTNESS OF BREATH Patient taking differently: Take 2.5 mg by nebulization every 4 (four) hours as needed for wheezing or shortness of breath. 02/12/21  Yes McGowen, Maryjean Morn, MD  albuterol (VENTOLIN HFA) 108 (90 Base) MCG/ACT inhaler INHALE 2 PUFFS BY MOUTH EVERY 6 HOURS AS NEEDED FOR WHEEZING OR SHORTNESS OF BREATH Patient taking differently: Inhale 2 puffs into the lungs every 4 (four) hours as needed for wheezing or shortness of breath. 06/05/20  Yes McGowen, Maryjean Morn, MD  aspirin 81 MG EC tablet Take 81 mg by mouth daily.    Yes [provider]  atorvastatin (LIPITOR) 40 MG tablet Take 1 tablet (40 mg total) by mouth daily. Patient taking differently: Take 40 mg by mouth at bedtime. 11/18/22  Yes McGowen, Maryjean Morn, MD  budesonide-formoterol [facility]) 160-4.5 MCG/ACT inhaler Inhale 2 puffs into the lungs 2 (two) times daily. 09/02/22  Yes McGowen, Maryjean Morn, MD  buPROPion (WELLBUTRIN XL) 300 MG  24 hr tablet Take 1 tablet (300 mg total) by mouth daily. 11/18/22  Yes McGowen, Maryjean Morn, MD  Calcium Carb-Cholecalciferol (CALCIUM + VITAMIN D3 PO)  Take 1-2 tablets by mouth See admin instructions. Take 1 tablet by mouth in the morning and 2 tablets by mouth at bedtime   Yes [provider]  cetirizine (ZYRTEC) 10 MG tablet Take 10 mg by mouth every evening.   Yes [provider]  ibandronate (BONIVA) 150 MG tablet Take 1 tablet (150 mg total) by mouth every 30 (thirty) days. Take in the morning with a full glass of water, on an empty stomach, and do not take anything else by mouth or lie down for the next 30 min. Patient taking differently: Take 150 mg by mouth every 30 (thirty) days. 08/04/22  Yes McGowen, Maryjean Morn, MD  insulin isophane & regular human KwikPen (HUMULIN 70/30 KWIKPEN) (70-30) 100 UNIT/ML KwikPen 4 units SQ bid Patient taking differently: Inject 4 Units into the skin in the morning and at bedtime. 02/17/23  Yes McGowen, Maryjean Morn, MD  ketorolac (ACULAR) 0.5 % ophthalmic solution Place 2 drops into the left eye in the morning, at noon, and at bedtime. 11/01/22  Yes [provider]  Multiple Vitamins-Minerals (MULTIVITAMIN WOMEN PO) Take 1 tablet by mouth daily.   Yes [provider]  pantoprazole (PROTONIX) 40 MG tablet Take 1 tablet (40 mg total) by mouth 2 (two) times daily. 01/10/23  Yes McGowen, Maryjean Morn, MD  prednisoLONE acetate (PRED FORTE) 1 % ophthalmic suspension Place 2 drops into the left eye in the morning, at noon, and at bedtime. 11/17/22  Yes [provider]  Spacer/Aero-Holding Chambers (AEROCHAMBER MV) inhaler Use as instructed 08/04/22   McGowen, Maryjean Morn, MD     Critical care time: N/A

## 2023-03-05 DIAGNOSIS — J441 Chronic obstructive pulmonary disease with (acute) exacerbation: Secondary | ICD-10-CM | POA: Diagnosis not present

## 2023-03-05 DIAGNOSIS — I509 Heart failure, unspecified: Secondary | ICD-10-CM | POA: Diagnosis not present

## 2023-03-05 DIAGNOSIS — Z7189 Other specified counseling: Secondary | ICD-10-CM

## 2023-03-05 DIAGNOSIS — N1832 Chronic kidney disease, stage 3b: Secondary | ICD-10-CM

## 2023-03-05 DIAGNOSIS — Z515 Encounter for palliative care: Secondary | ICD-10-CM | POA: Diagnosis not present

## 2023-03-05 DIAGNOSIS — I4729 Other ventricular tachycardia: Secondary | ICD-10-CM | POA: Diagnosis not present

## 2023-03-05 DIAGNOSIS — C3490 Malignant neoplasm of unspecified part of unspecified bronchus or lung: Secondary | ICD-10-CM

## 2023-03-05 DIAGNOSIS — I5041 Acute combined systolic (congestive) and diastolic (congestive) heart failure: Secondary | ICD-10-CM | POA: Diagnosis not present

## 2023-03-05 DIAGNOSIS — E871 Hypo-osmolality and hyponatremia: Secondary | ICD-10-CM | POA: Diagnosis not present

## 2023-03-05 DIAGNOSIS — I10 Essential (primary) hypertension: Secondary | ICD-10-CM | POA: Diagnosis not present

## 2023-03-05 DIAGNOSIS — I5181 Takotsubo syndrome: Secondary | ICD-10-CM | POA: Diagnosis not present

## 2023-03-05 LAB — CBC WITH DIFFERENTIAL/PLATELET
Abs Immature Granulocytes: 0.31 10*3/uL — ABNORMAL HIGH (ref 0.00–0.07)
Basophils Absolute: 0 10*3/uL (ref 0.0–0.1)
Basophils Relative: 0 %
Eosinophils Absolute: 0 10*3/uL (ref 0.0–0.5)
Eosinophils Relative: 0 %
HCT: 31.4 % — ABNORMAL LOW (ref 36.0–46.0)
Hemoglobin: 9.8 g/dL — ABNORMAL LOW (ref 12.0–15.0)
Immature Granulocytes: 3 %
Lymphocytes Relative: 4 %
Lymphs Abs: 0.4 10*3/uL — ABNORMAL LOW (ref 0.7–4.0)
MCH: 26.5 pg (ref 26.0–34.0)
MCHC: 31.2 g/dL (ref 30.0–36.0)
MCV: 84.9 fL (ref 80.0–100.0)
Monocytes Absolute: 1 10*3/uL (ref 0.1–1.0)
Monocytes Relative: 10 %
Neutro Abs: 9.2 10*3/uL — ABNORMAL HIGH (ref 1.7–7.7)
Neutrophils Relative %: 83 %
Platelets: 197 10*3/uL (ref 150–400)
RBC: 3.7 MIL/uL — ABNORMAL LOW (ref 3.87–5.11)
RDW: 18.6 % — ABNORMAL HIGH (ref 11.5–15.5)
WBC: 11 10*3/uL — ABNORMAL HIGH (ref 4.0–10.5)
nRBC: 0.6 % — ABNORMAL HIGH (ref 0.0–0.2)

## 2023-03-05 LAB — COMPREHENSIVE METABOLIC PANEL
ALT: 4928 U/L — ABNORMAL HIGH (ref 0–44)
AST: 6101 U/L — ABNORMAL HIGH (ref 15–41)
Albumin: 3.1 g/dL — ABNORMAL LOW (ref 3.5–5.0)
Alkaline Phosphatase: 96 U/L (ref 38–126)
Anion gap: 16 — ABNORMAL HIGH (ref 5–15)
BUN: 53 mg/dL — ABNORMAL HIGH (ref 8–23)
CO2: 21 mmol/L — ABNORMAL LOW (ref 22–32)
Calcium: 8 mg/dL — ABNORMAL LOW (ref 8.9–10.3)
Chloride: 92 mmol/L — ABNORMAL LOW (ref 98–111)
Creatinine, Ser: 1.87 mg/dL — ABNORMAL HIGH (ref 0.44–1.00)
GFR, Estimated: 28 mL/min — ABNORMAL LOW (ref >60)
Glucose, Bld: 139 mg/dL — ABNORMAL HIGH (ref 70–99)
Potassium: 4.2 mmol/L (ref 3.5–5.1)
Sodium: 129 mmol/L — ABNORMAL LOW (ref 135–145)
Total Bilirubin: 1.5 mg/dL — ABNORMAL HIGH (ref 0.3–1.2)
Total Protein: 5.8 g/dL — ABNORMAL LOW (ref 6.5–8.1)

## 2023-03-05 LAB — GLUCOSE, CAPILLARY
Glucose-Capillary: 116 mg/dL — ABNORMAL HIGH (ref 70–99)
Glucose-Capillary: 118 mg/dL — ABNORMAL HIGH (ref 70–99)
Glucose-Capillary: 130 mg/dL — ABNORMAL HIGH (ref 70–99)

## 2023-03-05 LAB — PHOSPHORUS: Phosphorus: 6.7 mg/dL — ABNORMAL HIGH (ref 2.5–4.6)

## 2023-03-05 LAB — MAGNESIUM: Magnesium: 2.7 mg/dL — ABNORMAL HIGH (ref 1.7–2.4)

## 2023-03-05 MED ORDER — MORPHINE SULFATE 10 MG/5ML PO SOLN: 5.0000 mg | Freq: Four times a day (QID) | ORAL | Status: DC | PRN

## 2023-03-05 MED ORDER — BIOTENE DRY MOUTH MT LIQD: 15.0000 mL | OROMUCOSAL | Status: DC | PRN

## 2023-03-05 MED ORDER — LORAZEPAM 2 MG/ML IJ SOLN: 0.5000 mg | INTRAMUSCULAR | Status: DC | PRN

## 2023-03-05 MED ORDER — POLYVINYL ALCOHOL 1.4 % OP SOLN: 1.0000 [drp] | Freq: Four times a day (QID) | OPHTHALMIC | Status: DC | PRN

## 2023-03-05 MED ORDER — GLYCOPYRROLATE 0.2 MG/ML IJ SOLN: 0.4000 mg | INTRAMUSCULAR | Status: DC | PRN

## 2023-03-05 MED ORDER — HYDROMORPHONE HCL-NACL 50-0.9 MG/50ML-% IV SOLN
0.5000 mg/h | INTRAVENOUS | Status: DC
  Administered 2023-03-05: 0.5 mg/h via INTRAVENOUS
  Filled 2023-03-05: qty 50

## 2023-03-05 MED ORDER — MORPHINE SULFATE 10 MG/5ML PO SOLN: 5.0000 mg | Freq: Once | ORAL | Status: DC

## 2023-03-05 MED ORDER — MORPHINE SULFATE (CONCENTRATE) 10 MG/0.5ML PO SOLN
5.0000 mg | Freq: Once | ORAL | Status: AC
  Administered 2023-03-05: 5 mg via ORAL
  Filled 2023-03-05: qty 0.5

## 2023-03-05 MED ORDER — HYDROMORPHONE BOLUS VIA INFUSION: 0.5000 mg | INTRAVENOUS | Status: DC | PRN

## 2023-03-05 NOTE — Progress Notes (Addendum)
Rounding Note    Patient Name: [name] Date of Encounter: 03/05/2023   [facility] Cardiologist: Bryan Lemma, MD   Subjective   Working to breath.   Inpatient Medications    Scheduled Meds:  arformoterol  15 mcg Nebulization BID   aspirin EC  81 mg Oral Daily   buPROPion  300 mg Oral Daily   dextromethorphan-guaiFENesin  1 tablet Oral BID   empagliflozin  10 mg Oral Daily   enoxaparin (LOVENOX) injection  30 mg Subcutaneous Daily   feeding supplement  237 mL Oral BID BM   furosemide  40 mg Oral Daily   insulin aspart  0-15 Units Subcutaneous Q4H   ipratropium-albuterol  3 mL Nebulization Q6H   methylPREDNISolone (SOLU-MEDROL) injection  40 mg Intravenous Q24H   metoprolol succinate  25 mg Oral Daily   multivitamin with minerals  1 tablet Oral Daily   pantoprazole  40 mg Oral BID   Continuous Infusions:  doxycycline (VIBRAMYCIN) IV 100 mg (03/04/23 2320)   meropenem (MERREM) IV 1 g (03/05/23 0906)   PRN Meds: acetaminophen **OR** acetaminophen, albuterol, dextrose, ondansetron **OR** ondansetron (ZOFRAN) IV, senna-docusate   Vital Signs    Vitals:   03/05/23 0115 03/05/23 0420 03/05/23 0807 03/05/23 0822  BP:  (!) 106/59 108/65   Pulse:  85 82   Resp:  20 18   Temp:  98.1 F (36.7 C) (!) 96.5 F (35.8 C)   TempSrc:  Oral Axillary   SpO2: 91% 100% 100% 100%  Weight:      Height:        Intake/Output Summary (Last 24 hours) at 03/05/2023 0929 Last data filed at 03/04/2023 2050 Gross per 24 hour  Intake 299 ml  Output 100 ml  Net 199 ml      03/04/2023    5:00 AM     7:10 PM 02/17/2023    1:13 PM  Last 3 Weights  Weight (lbs) 142 lb 3.2 oz 134 lb 7.7 oz 134 lb 9.6 oz  Weight (kg) 64.5 kg 61 kg 61.054 kg      Telemetry    Sinus rhythm, PVCs - Personally Reviewed  ECG    No new - Personally Reviewed  Physical Exam   Vitals:   03/05/23 0807 03/05/23 0822  BP: 108/65   Pulse: 82   Resp: 18   Temp: (!) 96.5 F  (35.8 C)   SpO2: 100% 100%    GEN: distressed, use of accessory muscles Neck: JVD challenging with increased wob Cardiac: RRR, no murmurs, rubs, or gallops.  Respiratory: diffuse wheezing throughout GI: non-distended  MS: No edema; No deformity. Neuro:  Nonfocal  Psych: Normal affect   Labs    High Sensitivity Troponin:   Recent Labs  Lab 03/10/2023 1909 03/20/2023 2114  TROPONINIHS 19* 17     Chemistry Recent Labs  Lab 03/03/23 0639 03/04/23 0358 03/05/23 0452  NA 130* 130* 129*  K 3.8 4.3 4.2  CL 91* 90* 92*  CO2 24 26 21*  GLUCOSE 166* 153* 139*  BUN 21 35* 53*  CREATININE 1.06* 1.44* 1.87*  CALCIUM 8.5* 8.2* 8.0*  MG 2.3 2.6* 2.7*  PROT 6.3* 5.9* 5.8*  ALBUMIN 3.6 3.1* 3.1*  AST 35 4,961* 6,101*  ALT 26 3,379* 4,928*  ALKPHOS 64 84 96  BILITOT 1.0 1.4* 1.5*  GFRNONAA 55* 38* 28*  ANIONGAP 15 14 16*    Lipids  Recent Labs  Lab 03/02/23 0725  CHOL 125  TRIG 45  HDL 62  LDLCALC 54  CHOLHDL 2.0    Hematology Recent Labs  Lab 03/03/23 0639 03/04/23 0358 03/05/23 0452  WBC 9.3 10.3 11.0*  RBC 3.56* 3.52* 3.70*  HGB 9.6* 9.4* 9.8*  HCT 30.2* 30.0* 31.4*  MCV 84.8 85.2 84.9  MCH 27.0 26.7 26.5  MCHC 31.8 31.3 31.2  RDW 18.6* 18.8* 18.6*  PLT 191 213 197   Thyroid No results for input(s): "TSH", "FREET4" in the last 168 hours.  BNP Recent Labs  Lab 03/07/2023 1909  BNP 889.7*    DDimer No results for input(s): "DDIMER" in the last 168 hours.   Radiology    CT CHEST WO CONTRAST  Result Date: 03/03/2023 CLINICAL DATA:  Poor respiratory status EXAM: CT CHEST WITHOUT CONTRAST TECHNIQUE: Multidetector CT imaging of the chest was performed following the standard protocol without IV contrast. RADIATION DOSE REDUCTION: This exam was performed according to the departmental dose-optimization program which includes automated exposure control, adjustment of the mA and/or kV according to patient size and/or use of iterative reconstruction technique.  COMPARISON:  Chest x-ray 03/02/2023, CT chest 02/27/2023, 11/10/2022, 05/21/2022, 09/08/2021, 08/12/2017 FINDINGS: Cardiovascular: Limited evaluation without intravenous contrast. Moderate aortic atherosclerosis. No aneurysm. Coronary vascular calcification. Cardiomegaly. Small pericardial effusion. Mediastinum/Nodes: Midline trachea. No suspicious thyroid mass. No increasing adenopathy. Esophagus contains contrast. Lungs/Pleura: Small moderate left pleural effusion. This is similar in size compared to prior CT. Moderate right pleural effusion appears slightly decrease, though with right apical fluid present. Right parahilar consolidation and bronchiectasis corresponding to history of radiation fibrosis. Right apical consolidation, stable with recent priors, some progression since more remote CT from 2022. Interim development of diffuse heterogeneous ground-glass density and septal thickening throughout the left hemithorax. Small focus of consolidation in the left lower lobe. Upper Abdomen: Limited by streak artifact from enteral contrast. Musculoskeletal: Stable lytic lesion at sternum. No acute osseous abnormality. IMPRESSION: 1. Similar small moderate left pleural effusion though with interim development of widespread heterogeneous ground-glass density throughout the left thorax with mild septal thickening, findings either due to asymmetric pulmonary edema versus diffuse left lung pneumonia. 2. Similar to slightly decreased moderate right pleural effusion with apical capping. Fibrosis and bronchiectasis in the right parahilar lung, stable as compared with recent priors but with slight increased apical consolidation compared to more remote priors. This could be due to evolved post treatment change but continued close CT follow-up recommended to exclude recurrent tumor. 3. Cardiomegaly with similar sized small right pleural effusion. Aortic Atherosclerosis (ICD10-I70.0). Electronically Signed   By: Jasmine Pang  M.D.   On: 03/03/2023 23:45   DG Swallowing Func-Speech Pathology  Result Date: 03/03/2023 Table formatting from the original result was not included. Modified Barium Swallow Study Patient Details Name: [name] MRN: [id number] Date of Birth: [birth year] Today's Date: 03/03/2023 HPI/PMH: HPI: Pt is a 76-y/o female who presented secondary to nausea and vomiting and worsening cough over three days. CT chest 5/6:  New moderate right and small left layering pleural effusions, interval increase in chronic pericardial effusion also. No findings of acute pulmonary edema. Small increased right lower lobe anterior infrahilar consolidation which could be atelectasis or a small pneumonia. Pt evaluated by SLP 5/6; swallow function appeared WNL and a regular texture diet with thin liquids was recommended without need for follow up. SLP re-consulted 5/10 with report that "Pt coughs excessively when eating or drinking. Dry heaves from coughing." PMH: Takotsubo syndrome, hypothyroidism, CHF, COPD, anxiety and depression, DM Type 2, dementia, HLD, HTN, h/o small  cell lung cancer, and h/o CVA.  She has a chronic cough since her lung cancer treatment/radiation. Clinical Impression: Clinical Impression: Pt presents with oropharyngeal dysphagia characterized by a pharyngeal delay, and reduction in bolus cohesion, hyolaryngeal elevation, and anterior laryngeal excursion. She demonstrated escape of liquid boluses to the oral cavity floor, premature spillage, intermittently incomplete epiglottic inversion, and residue in the valleculae, and pyriform sinuses. Residue was improved with secondary swallows and/or a liquid wash. Penetration (PAS 3,5) was noted with thin liquids and to a less severe extent (i.e., PAS 3) with nectar thick, and honey thick liquids. Silent aspiration (PAS 8) was observed with thin liquids. Repeated instances of aspiration appeared to ultimately be sensed when pt was returned to her bed after the study as  evidenced by significant coughing. A chin tuck posture eliminated aspiration and improved laryngeal invasion of thin liquids to penetration (PAS 3), but prompted coughing was ineffective. With nectar thick liquids, a chin tuck posture eliminated dysfunctional penetration, improving it to PAS 2 (considered WNL). A dysphagia 3 diet with nectar thick liquids is recommended with observance of swallowing precautions including a chin tuck posture. Pt verbalized understanding and agreement regarding recommendations, but SLP anticipates that full supervision will be necessary to ensure consistent observance of precautions. SLP will follow pt for treatment. Factors that may increase risk of adverse event in presence of aspiration Rubye Oaks & Clearance Coots 2021): Factors that may increase risk of adverse event in presence of aspiration Rubye Oaks & Clearance Coots 2021): Respiratory or GI disease; Reduced cognitive function Recommendations/Plan: Swallowing Evaluation Recommendations Swallowing Evaluation Recommendations Recommendations: PO diet PO Diet Recommendation: Dysphagia 3 (Mechanical soft); Mildly thick liquids (Level 2, nectar thick) Liquid Administration via: Cup; Straw Medication Administration: Crushed with puree (or whole with nectar thick liquids) Supervision: Patient able to self-feed; Full supervision/cueing for swallowing strategies Swallowing strategies  : Slow rate; Small bites/sips; Chin tuck Postural changes: Position pt fully upright for meals Oral care recommendations: Oral care BID (2x/day) Caregiver Recommendations: Avoid jello, ice cream, thin soups, popsicles Treatment Plan Treatment Plan Treatment recommendations: Therapy as outlined in treatment plan below Follow-up recommendations: -- (TBS) Functional status assessment: Patient has had a recent decline in their functional status and demonstrates the ability to make significant improvements in function in a reasonable and predictable amount of time. Treatment  frequency: Min 2x/week Treatment duration: 2 weeks Interventions: Trials of upgraded texture/liquids; Diet toleration management by SLP; Compensatory techniques Recommendations Recommendations for follow up therapy are one component of a multi-disciplinary discharge planning process, led by the attending physician.  Recommendations may be updated based on patient status, additional functional criteria and insurance authorization. Assessment: Orofacial Exam: Orofacial Exam Oral Cavity - Dentition: Edentulous; Dentures, not available Anatomy: Anatomy: WFL Boluses Administered: Boluses Administered Boluses Administered: Thin liquids (Level 0); Mildly thick liquids (Level 2, nectar thick); Moderately thick liquids (Level 3, honey thick); Solid; Puree  Oral Impairment Domain: Oral Impairment Domain Lip Closure: No labial escape Tongue control during bolus hold: Escape to lateral buccal cavity/floor of mouth Bolus preparation/mastication: Timely and efficient chewing and mashing Bolus transport/lingual motion: Brisk tongue motion Oral residue: Trace residue lining oral structures Location of oral residue : Floor of mouth Initiation of pharyngeal swallow : Pyriform sinuses; Posterior laryngeal surface of the epiglottis  Pharyngeal Impairment Domain: Pharyngeal Impairment Domain Soft palate elevation: No bolus between soft palate (SP)/pharyngeal wall (PW) Laryngeal elevation: Partial superior movement of thyroid cartilage/partial approximation of arytenoids to epiglottic petiole Anterior hyoid excursion: Partial anterior movement Epiglottic movement: Partial inversion  Laryngeal vestibule closure: Incomplete, narrow column air/contrast in laryngeal vestibule Pharyngeal stripping wave : Present - diminished Pharyngeal contraction (A/P view only): N/A Pharyngoesophageal segment opening: Complete distension and complete duration, no obstruction of flow Tongue base retraction: Narrow column of contrast or air between tongue base  and PPW Pharyngeal residue: Collection of residue within or on pharyngeal structures Location of pharyngeal residue: Valleculae; Pyriform sinuses  Esophageal Impairment Domain: Esophageal Impairment Domain Esophageal clearance upright position: Esophageal retention Pill: Esophageal Impairment Domain Esophageal clearance upright position: Esophageal retention Penetration/Aspiration Scale Score: Penetration/Aspiration Scale Score 1.  Material does not enter airway: Puree; Solid; Pill 3.  Material enters airway, remains ABOVE vocal cords and not ejected out: Thin liquids (Level 0); Mildly thick liquids (Level 2, nectar thick); Moderately thick liquids (Level 3, honey thick) 5.  Material enters airway, CONTACTS cords and not ejected out: Thin liquids (Level 0) 8.  Material enters airway, passes BELOW cords without attempt by patient to eject out (silent aspiration) : Thin liquids (Level 0) Compensatory Strategies: Compensatory Strategies Compensatory strategies: Yes Multiple swallows: Effective Effective Multiple Swallows: Puree; Solid Chin tuck: Effective; Ineffective Effective Chin Tuck: Mildly thick liquid (Level 2, nectar thick) Ineffective Chin Tuck: Thin liquid (Level 0) Liquid wash: Effective Effective Liquid Wash: Puree; Solid   General Information: Caregiver present: No  Diet Prior to this Study: NPO   Temperature : Normal   Respiratory Status: Tachypneic   Supplemental O2: Nasal cannula   History of Recent Intubation: No  Behavior/Cognition: Alert; Cooperative; Pleasant mood Self-Feeding Abilities: Able to self-feed Baseline vocal quality/speech: Normal Volitional Cough: Able to elicit Volitional Swallow: Able to elicit Exam Limitations: No limitations Goal Planning: Prognosis for improved oropharyngeal function: Good Barriers to Reach Goals: Cognitive deficits No data recorded Patient/Family Stated Goal: none stated No data recorded Pain: Pain Assessment Pain Assessment: No/denies pain Faces Pain Scale: 2  Pain Location: generalized Pain Descriptors / Indicators: Discomfort Pain Intervention(s): Monitored during session End of Session: Start Time:SLP Start Time (ACUTE ONLY): 1405 Stop Time: SLP Stop Time (ACUTE ONLY): 1420 Time Calculation:SLP Time Calculation (min) (ACUTE ONLY): 15 min Charges: SLP Evaluations $ SLP Speech Visit: 1 Visit SLP Evaluations $BSS Swallow: 1 Procedure $MBS Swallow: 1 Procedure SLP visit diagnosis: SLP Visit Diagnosis: Dysphagia, pharyngeal phase (R13.13) Past Medical History: Past Medical History: Diagnosis Date  Acute renal failure (HCC)   Age-related nuclear cataract of both eyes 2016  +cortical age related cataracts OU   Anxiety and depression   Bilateral diabetic retinopathy (HCC) 2015  Dr. 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  Colonic diverticular abscess   COPD (chronic obstructive pulmonary disease) (HCC)   CVA (cerebral vascular accident) (HCC) 09/2016  MRI did show a left-sided small ischemic stroke which was acute but likely incidental (MRI was done b/c pt had TIA sx's in hospital).  Neuro put her on plavix at that time.  Cardiology d/c'd her plavix 08/2017.  Diabetes mellitus with complication (HCC)   diab retinopathy OU (laser)  Diverticulitis of large intestine with perforation and abscess 09/11/2016  Finger fracture, right 04/2020  R 5th metacarpal fx at the base (Dr. Dion Saucier)  GERD (gastroesophageal reflux disease)   protonix  History of concussion 08/25/2018  w/out loss of consciousness--mild increase in baseline memory impairment after.  CT head neg acute.  History of diverticulitis of colon  with abscess; required IR percutaneous drain placed 09/2016.  History of iron deficiency anemia   started iron 07/02/19.  EGD/colonoscopy 08/27/19 unrevealing. Capsule endoscopy considered but never done.   Hyperlipidemia   Hypertension   Left bundle Baxter Gonzalez block (LBBB) 12/16/2016  Lung cancer (HCC)   non-small cell lung ca, stage III in 05/2011; systemic chemotherapy concurrent with radiation followed by prophylactic cranial irradiation and has been observation since July of 2010 with no evidence for disease recurrence-released from onc f/u 12/2014 (needs annual cxr by PCP).  CXR 08/2015 stable.  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 Shirline Frees did bx and this was NEG for malignancy.  Mild cognitive impairment with memory loss   Likely from brain radiation therapy  Non-obstructive CAD (coronary artery disease)   a. Cath 2006 preserved LV fxn, scattered irregularities without critical stenosis; b. 2008 stress echo negative for ischemia, but with hypertensive response; c. 08/2016 Cath: D1 25%, otw nl.  Normocytic anemia 2018  Iron, vit B12, folate all normal 12/2016.  Open-angle glaucoma 2016  Dr. Hanley Seamen, (bilateral)---responding to topical therapy  Osteoarthritis of both hands   Osteoporosis 03/2016; 03/2018  03/2016 DEXA T-score -2.2.  03/2018 T-score L femoral neck  -2.7--boniva started. DEXA 04/2021 T scor -2.6->boniva continued. Plan rpt DEXA 2 yrs.  Pneumonia   hospitalization 11/2016; hypoxemic resp failure--d/c'd home with home oxygen therapy  Pulmonary nodule, left 08/2021  LUL 11 mm ground glass, needs f/u CT 08/2022.  Seasonal allergic rhinitis   Subclinical hyperthyroidism 2018  Takotsubo cardiomyopathy   a. 08/2016 Echo: EF 30-35%, gr1DD, PASP ;  b. 08/2016 Cath: nonobs Dzs;  c. 11/2016 Echo: EF 40-45%, no rwma, nl RV fxn, mild TR, PASP .  ECHO 02/2017 EF 50-55%, grd II DD---essentially resolved Takotsubo 02/2017.  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 08/2016 in setting of diverticulitis & pneumoperitoneum and Takotsubo CM -->prolonged QT, seen by EP-->avoid meds with potential for QT prolongation. Past Surgical History: Past Surgical History: Procedure Laterality Date  ABDOMINAL HYSTERECTOMY  1997   APPENDECTOMY    CARDIAC CATHETERIZATION N/A 09/14/2016  Procedure: Left Heart Cath and Coronary Angiography;  Surgeon: Lennette Bihari, MD;  Location: [facility] INVASIVE [facility];  Service: Cardiovascular; Mild nonobstructive CAD with 85% smooth narrowing in the first diagonal Bhavya Eschete of the LAD; 10% smooth narrowing of the ostial proximal left circumflex coronary artery; and a normal dominant RCA.  CARPAL TUNNEL RELEASE    CATARACT EXTRACTION Bilateral   CHOLECYSTECTOMY  2002  COLONOSCOPY  10/24/00; 08/27/19  2002 normal.  [employer] hemoccult testing via [facility] 06/18/15 was NEG.  2020->diverticulosis o/w nl.  dexa  03/2016; 03/2018  03/2018 T-score L femoral neck  -2.7 (worsened compared to osteopenic range 2017.) DEXA 05/03/21 stable T score -2.6 on boniva x 2 yrs->plan rpt DEXA 2 yrs.  ESOPHAGOGASTRODUODENOSCOPY  08/27/2019  Normal.  IR GENERIC HISTORICAL  09/19/2016  IR SINUS/FIST TUBE CHK-NON GI 09/19/2016 Gilmer Mor, DO [facility]  IR GENERIC HISTORICAL  10/06/2016  IR RADIOLOGIST EVAL & MGMT 10/06/2016 [facility]  IR GENERIC HISTORICAL  10/26/2016  IR RADIOLOGIST EVAL & MGMT 10/26/2016 [facility]  IR GENERIC HISTORICAL  11/03/2016  IR RADIOLOGIST EVAL & MGMT 11/03/2016 Gershon Crane, PA-C [facility]  IR GENERIC HISTORICAL  11/24/2016  IR RADIOLOGIST EVAL & MGMT 11/24/2016 Gershon Crane, PA-C [facility]  IR GENERIC HISTORICAL  12/05/2016  Fistula smaller/improving.  IR SINUS/FIST TUBE CHK-NON GI 12/05/2016 Simonne Come, MD [facility]  IR [facility]  HISTORICAL  12/22/2016  IR RADIOLOGIST EVAL & MGMT 12/22/2016 Berdine Dance, MD [facility]  IR RADIOLOGIST EVAL & MGMT  01/10/2017  IR RADIOLOGIST EVAL & MGMT  02/09/2017  IR SINUS/FIST TUBE CHK-NON GI  03/28/2017  LEFT HEART CATHETERIZATION WITH CORONARY ANGIOGRAM N/A 05/30/2012  Procedure: LEFT HEART CATHETERIZATION WITH CORONARY ANGIOGRAM;  Surgeon: Kathleene Hazel, MD;  Location: [facility] C[facility] LAB;  Service: Cardiovascular: Mild,  non-obstructive CAD  TRANSTHORACIC ECHOCARDIOGRAM  09/14/2016; 11/2016  08/2016: EF 30-35 %, Akinesis of the mid-apicalanteroseptal myocardium.  Grade I DD.  Mild pulm HTN.  Repeat echo 11/2016, EF 40-45%, no rwma, nl RV fxn, mild TR, PASP .    TRANSTHORACIC ECHOCARDIOGRAM  03/07/2017  EF 50-55%. Hypokinesis of the distal septum with overall low normal LV,  systolic function; mild diastolic dysfunction with elevated LV  filling pressure; mildly calcified aortic valve with mild AI; small pericardial effusion  TRANSTHORACIC ECHOCARDIOGRAM    04/2022 EF 60-65%, no WMA, no valve probs  Zio patch  11/2022  3 day:  primarily sinus, avg HR 97, brief run SVT, rare PVC Shanika I. Vear Clock, MS, CCC-SLP Neuro Diagnostic Specialist [facility] Services Office number: [phone] Scheryl Marten 03/03/2023, 3:23 PM   Cardiac Studies   Echo 02/27/23: 1. Left ventricular ejection fraction, by estimation, is 20%. The left  ventricle has severely decreased function. The left ventricle demonstrates  regional wall motion abnormalities with global hypokinesis with relatively  preserved basal lateral and  basal anterior wall motion. There was septal-lateral dyssynchrony  consistent with IVCD. No LV thrombus noted. Left ventricular diastolic  parameters are consistent with Grade II diastolic dysfunction  (pseudonormalization).   2. IVC not visualized. Right ventricular systolic function is normal. The  right ventricular size is normal. Tricuspid regurgitation signal is  inadequate for assessing PA pressure.   3. Left atrial size was mildly dilated.   4. The mitral valve is abnormal. Moderate mitral valve regurgitation. No  evidence of mitral stenosis. Moderate mitral annular calcification.   5. The aortic valve is tricuspid. There is moderate calcification of the  aortic valve. Aortic valve regurgitation is mild. Mild aortic valve  stenosis. Aortic valve area, by VTI measures 1.75 cm. Aortic valve mean   gradient measures 14.0 mmHg.   6. A small pericardial effusion is present. The pericardial effusion is  circumferential.   Patient Profile     Ms. Babauta is a 81F with Takotsubo cardiomyopathy with recovered LVEF, non-obstructive CAD, LBBB, orthostatic hypotension, COPD, lung cancer s/p XRT, and cardiac arrest in the setting of torsades admitted with cough and wheezing. Cardiology consulted for heart failure.   Assessment & Plan    COPD exacerbation/PNA: per primary team/pulmonology. Suspect this is the primary issue. On IV methylpred and meropenem  Systolic CHF: global hypokinesis. C/f stress cardiomyopathy.  - based on her CT and clinical picture, don't think this is primarily CHF exacerbation. She had IV diuresis, renal fxn did not respond. She now has shock labs with LFTs in the thousands.  Agree with pulm and palliative care -continue lasix 40 mg daily -continue metop XL 25 mg daily; can hold if Bps too low -cannot uptitrate GDMT  CAD - continue asa  - stop statin with LFTs  NSVT/ hx of torsades - K>4, Mg>2  Will follow peripherally, unlikely significant changes. Don't hesitate to reach out for questions.      For questions or updates, please contact [facility] Please consult [email or web address] for contact info under  Signed, Maisie Fus, MD  03/05/2023, 9:29 AM

## 2023-03-05 NOTE — Plan of Care (Signed)
  Problem: Education: Goal: Ability to describe self-care measures that may prevent or decrease complications (Diabetes Survival Skills Education) will improve Outcome: Progressing Goal: Individualized Educational Video(s) Outcome: Progressing   Problem: Coping: Goal: Ability to adjust to condition or change in health will improve Outcome: Progressing   Problem: Fluid Volume: Goal: Ability to maintain a balanced intake and output will improve Outcome: Progressing   Problem: Health Behavior/Discharge Planning: Goal: Ability to identify and utilize available resources and services will improve Outcome: Progressing Goal: Ability to manage health-related needs will improve Outcome: Progressing   Problem: Metabolic: Goal: Ability to maintain appropriate glucose levels will improve Outcome: Progressing   Problem: Nutritional: Goal: Maintenance of adequate nutrition will improve Outcome: Progressing Goal: Progress toward achieving an optimal weight will improve Outcome: Progressing   Problem: Skin Integrity: Goal: Risk for impaired skin integrity will decrease Outcome: Progressing   Problem: Tissue Perfusion: Goal: Adequacy of tissue perfusion will improve Outcome: Progressing   Problem: Education: Goal: Knowledge of General Education information will improve Description: Including pain rating scale, medication(s)/side effects and non-pharmacologic comfort measures Outcome: Progressing   Problem: Health Behavior/Discharge Planning: Goal: Ability to manage health-related needs will improve Outcome: Progressing   Problem: Clinical Measurements: Goal: Ability to maintain clinical measurements within normal limits will improve Outcome: Progressing Goal: Will remain free from infection Outcome: Progressing Goal: Diagnostic test results will improve Outcome: Progressing Goal: Respiratory complications will improve Outcome: Progressing Goal: Cardiovascular complication will  be avoided Outcome: Progressing   Problem: Activity: Goal: Risk for activity intolerance will decrease Outcome: Progressing   Problem: Nutrition: Goal: Adequate nutrition will be maintained Outcome: Progressing   Problem: Coping: Goal: Level of anxiety will decrease Outcome: Progressing   Problem: Elimination: Goal: Will not experience complications related to bowel motility Outcome: Progressing Goal: Will not experience complications related to urinary retention Outcome: Progressing   Problem: Pain Managment: Goal: General experience of comfort will improve Outcome: Progressing   Problem: Safety: Goal: Ability to remain free from injury will improve Outcome: Progressing   Problem: Skin Integrity: Goal: Risk for impaired skin integrity will decrease Outcome: Progressing   Problem: Education: Goal: Knowledge of the prescribed therapeutic regimen will improve Outcome: Progressing   Problem: Coping: Goal: Ability to identify and develop effective coping behavior will improve Outcome: Progressing   Problem: Clinical Measurements: Goal: Quality of life will improve Outcome: Progressing   Problem: Respiratory: Goal: Verbalizations of increased ease of respirations will increase Outcome: Progressing   Problem: Role Relationship: Goal: Family's ability to cope with current situation will improve Outcome: Progressing Goal: Ability to verbalize concerns, feelings, and thoughts to partner or family member will improve Outcome: Progressing   Problem: Pain Management: Goal: Satisfaction with pain management regimen will improve Outcome: Progressing   

## 2023-03-05 NOTE — Progress Notes (Signed)
[name] ZOX:[id number] DOB: 01-08-1946 DOA:  PCP: [name], MD   Subj:   77 yo WF PMHx depression and dementia, Non ischemic, stress induced cardiomyopathy, HTN, COPD, DM type II,,  small cell lung cancer s/p XRT, Hx of CVA,    Presented with dyspnea. Patient reported 3 days of worsening cough with increase sputum production, persistent despite use of nebulizer treatments at home. On the day of hospitalization she developed wheezing, and respiratory distress. EMS was called, patient was placed on Cpap and transported to the ED. Note that she vomited while on Cpap and then this device was removed. On her initial physical examination in the ED her blood pressure was 113/75, HR 113, RR 16 and 02 saturation 99%, her lungs had coarse breath sounds, and positive wheezing, heart with S1 and S2 present and rhythmic, abdomen with no distention and no lower extremity edema.    Na 126, K 4,5 CL 91, bicarbonate 23, glucose 154, bun 10 cr 1.0 BNP 889 High sensitive troponin 19  Wbc 8,6 hgb 12.1 plt 282  Sars covid 19 negative    CXR with hyperinflation, right upper love scarring, right sided pleural effusion.   CT chest with bilateral ground glass opacities, right upper lobe scarring. Right pleural effusion, interval increase in chronic pericardial effusion. Small increased right lower lobe anterior infrahilar consolidation. 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    EKG 111 bpm, normal axis, left bundle branch block, qtc less than 500, sinus rhythm with PAC with aberrancy, q wave V1 to V3, no significant ST segment or T wave changes.    Obj: 5/12 afebrile overnight increased WOB, only responded by giving me a thumbs up when I asked her how she was doing.  Per RN overnight patient asked to be made comfort care.     Objective: VITAL SIGNS: Temp: 96.5 F (35.8 C) (05/12 0807) Temp Source: Axillary (05/12 0807) BP: 108/65 (05/12 0807) Pulse Rate: 82  (05/12 0807)   VENTILATOR SETTINGS: Nasal cannula 5/12 Flow 3 L/min SpO2= 100%    Procedures/Significant Events: 5/6 CT chest W contrast 1. New moderate right and small left layering pleural effusions, interval increase in chronic pericardial effusion also. No findings of acute pulmonary edema. 2. Small increased right lower lobe anterior infrahilar consolidation which could be atelectasis or a small pneumonia. 3. Small new areas of ground-glass disease in the apex of the left upper lobe which are probably inflammatory/infectious. 4. Diffusely thickened bronchi. 5. Slightly prominent single left mid hilar lymph node is likely reactive. 6. 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 7. Aortic and coronary artery atherosclerosis. 8. Prominent left ventricular cavity, which may be seen with cardiomyopathy and prior ischemic disease. 9. Chronic soft tissue thickening in the subcarinal space and along the right mid to upper hilum which were seen previously and also compatible with XRT.  5/10 CT chest W0 contrast . Similar small moderate left pleural effusion though with interim development of widespread heterogeneous ground-glass density throughout the left thorax with mild septal thickening, findings either due to asymmetric pulmonary edema versus diffuse left lung pneumonia. 2. Similar to slightly decreased moderate right pleural effusion with apical capping. Fibrosis and bronchiectasis in the right parahilar lung, stable as compared with recent priors but with slight increased apical consolidation compared to more remote priors. This could be due to evolved post treatment change but continued close CT follow-up recommended to exclude recurrent tumor. 3. Cardiomegaly with similar sized small right pleural effusion.  Consultants:  PCCM Dr. Myrla Halsted   Cultures 5/8 positive rhinovirus   Antimicrobials: Anti-infectives (From admission, onward)     Start     Ordered Stop   03/04/23 1200  meropenem (MERREM) 1 g in sodium chloride 0.9 % 100 mL IVPB        03/04/23 1106     03/04/23 1145  doxycycline (VIBRAMYCIN) 100 mg in sodium chloride 0.9 % 250 mL IVPB        03/04/23 1052     02/27/23 0015  Ampicillin-Sulbactam (UNASYN) 3 g in sodium chloride 0.9 % 100 mL IVPB  Status:  Discontinued        02/27/23 0004 03/04/23 1052            .Physical Exam:  General: Alert but not interactive, positive acute respiratory distress Eyes: negative scleral hemorrhage, negative anisocoria, negative icterus ENT: Negative Runny nose, negative gingival bleeding, Neck:  Negative scars, masses, torticollis, lymphadenopathy, JVD Lungs: diffuse decreased breath sounds, positive expiratory wheeze Cardiovascular: Regular rate and rhythm without murmur gallop or rub normal S1 and S2 Abdomen: negative abdominal pain, nondistended, positive soft, bowel sounds, no rebound, no ascites, no appreciable mass Extremities: No significant cyanosis, clubbing, or edema bilateral lower extremities Skin: Negative rashes, lesions, ulcers Psychiatric: Unable to evaluate, Central nervous system: Moved her hand to give me a thumbs up otherwise unresponsive  .   DVT prophylaxis: Lovenox  Code Status: Full Family Communication: 5/11 spoke with [name] (daughter) discussed plan of care answered all questions Status is: Inpatient    Dispo: The patient is from: Home              Anticipated d/c is to: SNF              Anticipated d/c date is: > 3 days              Patient currently is not medically stable to d/c.      Assessment & Plan: Covid vaccination;   Active Problems:   Chronic obstructive pulmonary disease with acute exacerbation (HCC)   Takotsubo cardiomyopathy   Hyponatremia   Essential hypertension   Dementia without behavioral disturbance (HCC)   Type 2 diabetes mellitus with hyperlipidemia (HCC)   Malnutrition of moderate degree   Small cell  lung cancer (HCC)   Acute on chronic congestive heart failure (HCC)   NSVT (nonsustained ventricular tachycardia) (HCC)   Rhinovirus infection   Bilateral pleural effusion   CKD (chronic kidney disease), stage III (HCC)  COPD with acute exacerbation (HCC)/acute respiratory failure with hypoxia -Continue current antibiotics to complete 7-day course - DuoNeb QID -Hypertonic saline nebs BID x 3 days -Flutter valve - Incentive spirometry - Solu-Medrol 40 mg daily - Mucinex DM BID -5/12 patient has decided on making herself comfort care  CAP Right lower lobe pneumonia/RUL occlusion/Aspiration PNA/positive rhinovirus -5/6 passed swallow evaluation by speech. -5/8 respiratory virus panel pending -5/8 chronic RUL fibrosis, with atelectasis vs pneumonia.  See above -5/8 see COPD -5/9 PCXR consistent with pulmonary edema -5/10 CT chest R/O neoplasm etc. -5/10 per RN [name] patient sounding much more wet, increased WOB.  I concur.   -5/10 speech recommends dysphagia 3 with nectar thick -5/11 discussed case with Dr. Myrla Halsted, PCCM.  Does not believe the fibrosis around right superior lobe bronchus amenable to stenting.  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  Which appears to me to have occluded right upper lobe.  Secondary to small cell lung cancer s/p XRT (July 2010) - 5/10 consult PCCM in a.m. doubt there is anything they can do, possibly stent? - 5/10 repeat chest CT increase significant groundglass opacification left lung field see above - 5/11 continue diuresing.  See CHF 5/12 patient has decided on making herself comfort care  Takotsubo cardiomyopathy/Acute on chronic systolic CHF -Echocardiogram LVEF = 20%, no LVH, RV systolic function is preserved, small pericardial  effusion, moderate MR, mild aortic stenosis.  New findings.  Last echocardiogram 05/22/2022 LVEF=60 to 65%  -Dr. Bryan [name] is patient's cardiologist last seen 4/13 -5/8 strict in and out +1.1 L - 5/8 Daily weight Filed Weights   02/27/2023 1910 03/04/23 0500  Weight: 61 kg 64.5 kg  -5/9 consult cardiology, they will see patient today will await recommendations. - 5/9 patient continues SOB, sounds wet increase Lasix IV 40 mg BID -5/9 Albumin 50 g prior to Lasix -5/10 PureWick - 5/10 increase Lasix IV 40 mg TID -5/11 Lasix held per cardiology secondary to increased creatinine. -5/12 Lasix 40 mg daily (hold) creatinine increasing 5/12 patient has decided on making herself comfort care  CKD stage IIIa (baseline Cr 1.05-1.25) Lab Results  Component Value Date   CREATININE 1.87 (H) 03/05/2023   CREATININE 1.44 (H) 03/04/2023   CREATININE 1.06 (H) 03/03/2023   CREATININE 0.95 03/02/2023   CREATININE 1.01 (H) 03/01/2023    Hyponatremia Lab Results  Component Value Date   NA 129 (L) 03/05/2023   NA 130 (L) 03/04/2023   NA 130 (L) 03/03/2023   NA 131 (L) 03/02/2023   NA 131 (L) 03/01/2023  -Improving   Essential hypertension -See CHF 5/12 patient has decided on making herself comfort care   Dementia without behavioral disturbance (HCC) -5/8 must be mild, A/O x 4 today.     Type 2 diabetes mellitus controlled with HLD (HCC) -4/26 hemoglobin A1c= 6.5 - Moderate SSI CBG (last 3)  Recent Labs    03/05/23 0009 03/05/23 0423 03/05/23 0811  GLUCAP 118* 116* 130*     HLD - 5/9 lipid panel; LDL= 54.  Within goal - Lipitor 40 mg daily   Malnutrition of moderate degree -Continue with nutritional supplements.   Goals of care - 5/10 palliative care consult: Patient with multiple medical conditions, slowly deteriorating consider changing CODE STATUS to DNR, hospice. -5/11 NP [name] palliative care has scheduled meeting with daughter [name] for [id number]  on 5/12  -5/12 patient has decided on making herself comfort care     Mobility Assessment (last 72 hours)     Mobility Assessment     Row Name 03/04/23 2000 03/04/23 1500 03/03/23 2150 03/03/23 0920 03/02/23 2147   Does patient have an order for bedrest or is patient medically unstable No - Continue assessment No - Continue assessment No - Continue assessment No - Continue assessment No - Continue assessment   What is the highest level of mobility based on the progressive mobility assessment? Level 5 (Walks with assist in room/hall) - Balance while stepping forward/back and can walk in room with assist - Complete Level 5 (Walks with assist in room/hall) - Balance while stepping forward/back and can walk in room with assist - Complete Level 4 (Walks with assist in room) - Balance while marching in place and cannot step forward and back - Complete Level 4 (Walks with assist in room) - Balance  while marching in place and cannot step forward and back - Complete Level 4 (Walks with assist in room) - Balance while marching in place and cannot step forward and back - Complete                 Time: 50 minutes         Care during the described time interval was provided by me .  I have reviewed this patient's available data, including medical history, events of note, physical examination, and all test results as part of my evaluation.

## 2023-03-05 NOTE — Progress Notes (Signed)
Pts blood sugar dropped to 67 lastnight. Pt was able to drink 4oz juice and few bites of dinner with encouragement. Blood sugar improved upon recheck and continue to be maintained with each check there after.

## 2023-03-06 ENCOUNTER — Telehealth: Payer: Self-pay

## 2023-03-06 DIAGNOSIS — J441 Chronic obstructive pulmonary disease with (acute) exacerbation: Secondary | ICD-10-CM | POA: Diagnosis not present

## 2023-03-06 DIAGNOSIS — E871 Hypo-osmolality and hyponatremia: Secondary | ICD-10-CM | POA: Diagnosis not present

## 2023-03-06 DIAGNOSIS — I509 Heart failure, unspecified: Secondary | ICD-10-CM | POA: Diagnosis not present

## 2023-03-06 DIAGNOSIS — I5181 Takotsubo syndrome: Secondary | ICD-10-CM | POA: Diagnosis not present

## 2023-03-09 MED ORDER — PERFLUTREN LIPID MICROSPHERE
1.0000 mL | INTRAVENOUS | Status: AC | PRN
  Administered 2023-03-09: 3 mL via INTRAVENOUS

## 2023-03-25 NOTE — Progress Notes (Signed)
Nutrition Brief Note  Chart reviewed. Pt now transitioning to comfort care.  No further nutrition interventions planned at this time.  Please re-consult as needed.   Duayne Brideau M Aiman Noe, RD, LDN, CNSC.   

## 2023-03-25 NOTE — Telephone Encounter (Signed)
FYI, daughter called to notify pt passed this morning.

## 2023-03-25 NOTE — Progress Notes (Signed)
Brief cardiology note:  Patient pursuing comfort care. Cardiology will sign off, but please reach out if we can be of assistance.  Jodelle Red, MD, PhD, [facility]  [facility]  [place]  [facility] at [facility] at [facility] [address], [address] [place], [place] [address] [phone]

## 2023-03-25 NOTE — Progress Notes (Signed)
At bedside to support family after patient's death.   Barrie Folk MS Ed.S, RN Palliative Medicine Team  Team phone: [phone]

## 2023-03-25 NOTE — Progress Notes (Signed)
Patient passed at 1112. Nicole Kindred, RN and myself pronounced. No heartbeat or respirations auscultated after 1 minute.  Family at the bedside, grieving appropriately. Dilaudid drip discontinued and 37mL wasted in Pyxis.  [facility] called and flowsheet completed

## 2023-03-25 NOTE — Progress Notes (Signed)
This chaplain responded to PMT NP-Michelle consult for EOL spiritual care.   The Pt. is resting comfortably at the time of the visit with family at the bedside. The chaplain understands through storytelling the Pt. love for her family is a reflection of God's love in her life. The family accepted the chaplain's invitation for quiet conversation with the Pt.  This chaplain is available for F/U spiritual care as needed.  The family is appreciative of PMT's care.   Chaplain Stephanie Acre [phone]

## 2023-03-25 NOTE — Discharge Summary (Signed)
Death Summary  [name] ZOX:[id number] DOB: 1946-01-26 DOA: 2023/03/15  PCP: [name], MD PCP/Office notified:   Admit date: 2023-03-15 Date of Death: 23-Mar-2023  Final Diagnoses:  Active Problems:   Chronic obstructive pulmonary disease with acute exacerbation (HCC)   Takotsubo cardiomyopathy   Hyponatremia   Essential hypertension   Dementia without behavioral disturbance (HCC)   Type 2 diabetes mellitus with hyperlipidemia (HCC)   Malnutrition of moderate degree   Small cell lung cancer (HCC)   Acute on chronic congestive heart failure (HCC)   NSVT (nonsustained ventricular tachycardia) (HCC)   Rhinovirus infection   Bilateral pleural effusion   CKD (chronic kidney disease), stage III (HCC)   COPD with acute exacerbation (HCC)/acute respiratory failure with hypoxia -Continue current antibiotics to complete 7-day course - DuoNeb QID -Hypertonic saline nebs BID x 3 days -Flutter valve - Incentive spirometry - Solu-Medrol 40 mg daily - Mucinex DM BID -5/12 patient has decided on making herself comfort care   CAP Right lower lobe pneumonia/RUL occlusion/Aspiration PNA/positive rhinovirus -5/6 passed swallow evaluation by speech. -5/8 respiratory virus panel pending -5/8 chronic RUL fibrosis, with atelectasis vs pneumonia.  See above -5/8 see COPD -5/9 PCXR consistent with pulmonary edema -5/10 CT chest R/O neoplasm etc. -5/10 per RN Tiney Rouge patient sounding much more wet, increased WOB.  I concur.   -5/10 speech recommends dysphagia 3 with nectar thick -5/11 discussed case with Dr. Myrla Halsted, PCCM.  Does not believe the fibrosis around right superior lobe bronchus amenable to stenting.  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  Which appears to me  to have occluded right upper lobe.  Secondary to small cell lung cancer s/p XRT (July 2010) - 5/10 consult PCCM in a.m. doubt there is anything they can do, possibly stent? - 5/10 repeat chest CT increase significant groundglass opacification left lung field see above - 5/11 continue diuresing.  See CHF 5/12 patient has decided on making herself comfort care   Takotsubo cardiomyopathy/Acute on chronic systolic CHF -Echocardiogram LVEF = 20%, no LVH, RV systolic function is preserved, small pericardial effusion, moderate MR, mild aortic stenosis.  New findings.  Last echocardiogram 05/22/2022 LVEF=60 to 65%  -Dr. Bryan Lemma is patient's cardiologist last seen 4/13 -5/8 strict in and out +1.1 L - 5/8 Daily weight     Filed Weights    03/15/23 1910 03/04/23 0500  Weight: 61 kg 64.5 kg  -5/9 consult cardiology, they will see patient today will await recommendations. - 5/9 patient continues SOB, sounds wet increase Lasix IV 40 mg BID -5/9 Albumin 50 g prior to Lasix -5/10 PureWick - 5/10 increase Lasix IV 40 mg TID -5/11 Lasix held per cardiology secondary to increased creatinine. -5/12 Lasix 40 mg daily (hold) creatinine increasing 5/12 patient has decided on making herself comfort care   CKD stage IIIa (baseline Cr 1.05-1.25) Lab Results  Component Value Date   CREATININE 1.87 (H) 03/05/2023   CREATININE 1.44 (H) 03/04/2023   CREATININE 1.06 (H) 03/03/2023   CREATININE 0.95 03/02/2023   CREATININE 1.01 (H) 03/01/2023   Hyponatremia Lab Results  Component Value Date   NA 129 (L) 03/05/2023   NA 130 (L) 03/04/2023   NA 130 (L) 03/03/2023   NA 131 (L) 03/02/2023   NA 131 (L) 03/01/2023  -Improving   Essential hypertension -See  CHF 5/12 patient has decided on making herself comfort care   Dementia without behavioral disturbance (HCC) -5/8 must be mild, A/O x 4 today.     Type 2 diabetes mellitus controlled with HLD (HCC) -4/26 hemoglobin A1c= 6.5 - Moderate SSI CBG  (last 3)  Recent Labs (last 2 labs)       Recent Labs    03/05/23 0009 03/05/23 0423 03/05/23 0811  GLUCAP 118* 116* 130*         HLD - 5/9 lipid panel; LDL= 54.  Within goal - Lipitor 40 mg daily   Malnutrition of moderate degree -Continue with nutritional supplements.   History of present illness:  77 yo WF PMHx depression and dementia, Non ischemic, stress induced cardiomyopathy, HTN, COPD, DM type II,,  small cell lung cancer s/p XRT, Hx of CVA,     Presented with dyspnea. Patient reported 3 days of worsening cough with increase sputum production, persistent despite use of nebulizer treatments at home. On the day of hospitalization she developed wheezing, and respiratory distress. EMS was called, patient was placed on Cpap and transported to the ED. Note that she vomited while on Cpap and then this device was removed. On her initial physical examination in the ED her blood pressure was 113/75, HR 113, RR 16 and 02 saturation 99%, her lungs had coarse breath sounds, and positive wheezing, heart with S1 and S2 present and rhythmic, abdomen with no distention and no lower extremity edema.    Na 126, K 4,5 CL 91, bicarbonate 23, glucose 154, bun 10 cr 1.0 BNP 889 High sensitive troponin 19  Wbc 8,6 hgb 12.1 plt 282  Sars covid 19 negative    CXR with hyperinflation, right upper love scarring, right sided pleural effusion.   CT chest with bilateral ground glass opacities, right upper lobe scarring. Right pleural effusion, interval increase in chronic pericardial effusion. Small increased right lower lobe anterior infrahilar consolidation. 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    EKG 111 bpm, normal axis, left bundle branch block, qtc less than 500, sinus rhythm with PAC with aberrancy, q wave V1 to V3, no significant ST segment or T wave changes.   Hospital Course:  See above see above   Time: 20 minutes  Signed:  Carolyne Littles, MD [facility]

## 2023-03-25 NOTE — Plan of Care (Signed)
Patient comfort care, was alert last night and oriented, slept throughout night with Dilaudid drip on-going.  Purewick in place.  Soft BP this morning.  Pt's granddaughter remains at bedside.  POC maintained, will continue to monitor.  Problem: Education: Goal: Ability to describe self-care measures that may prevent or decrease complications (Diabetes Survival Skills Education) will improve Outcome: Progressing Goal: Individualized Educational Video(s) Outcome: Progressing   Problem: Coping: Goal: Ability to adjust to condition or change in health will improve Outcome: Progressing   Problem: Fluid Volume: Goal: Ability to maintain a balanced intake and output will improve Outcome: Progressing   Problem: Health Behavior/Discharge Planning: Goal: Ability to identify and utilize available resources and services will improve Outcome: Progressing Goal: Ability to manage health-related needs will improve Outcome: Progressing   Problem: Metabolic: Goal: Ability to maintain appropriate glucose levels will improve Outcome: Progressing   Problem: Nutritional: Goal: Maintenance of adequate nutrition will improve Outcome: Progressing Goal: Progress toward achieving an optimal weight will improve Outcome: Progressing   Problem: Skin Integrity: Goal: Risk for impaired skin integrity will decrease Outcome: Progressing   Problem: Tissue Perfusion: Goal: Adequacy of tissue perfusion will improve Outcome: Progressing   Problem: Education: Goal: Knowledge of General Education information will improve Description: Including pain rating scale, medication(s)/side effects and non-pharmacologic comfort measures Outcome: Progressing   Problem: Health Behavior/Discharge Planning: Goal: Ability to manage health-related needs will improve Outcome: Progressing   Problem: Clinical Measurements: Goal: Ability to maintain clinical measurements within normal limits will improve Outcome:  Progressing Goal: Will remain free from infection Outcome: Progressing Goal: Diagnostic test results will improve Outcome: Progressing Goal: Respiratory complications will improve Outcome: Progressing Goal: Cardiovascular complication will be avoided Outcome: Progressing   Problem: Activity: Goal: Risk for activity intolerance will decrease Outcome: Progressing   Problem: Nutrition: Goal: Adequate nutrition will be maintained Outcome: Progressing   Problem: Coping: Goal: Level of anxiety will decrease Outcome: Progressing   Problem: Elimination: Goal: Will not experience complications related to bowel motility Outcome: Progressing Goal: Will not experience complications related to urinary retention Outcome: Progressing   Problem: Pain Managment: Goal: General experience of comfort will improve Outcome: Progressing   Problem: Safety: Goal: Ability to remain free from injury will improve Outcome: Progressing   Problem: Skin Integrity: Goal: Risk for impaired skin integrity will decrease Outcome: Progressing   Problem: Education: Goal: Knowledge of the prescribed therapeutic regimen will improve Outcome: Progressing   Problem: Coping: Goal: Ability to identify and develop effective coping behavior will improve Outcome: Progressing   Problem: Clinical Measurements: Goal: Quality of life will improve Outcome: Progressing   Problem: Respiratory: Goal: Verbalizations of increased ease of respirations will increase Outcome: Progressing   Problem: Role Relationship: Goal: Family's ability to cope with current situation will improve Outcome: Progressing Goal: Ability to verbalize concerns, feelings, and thoughts to partner or family member will improve Outcome: Progressing   Problem: Pain Management: Goal: Satisfaction with pain management regimen will improve Outcome: Progressing

## 2023-03-25 DEATH — deceased

## 2023-03-31 ENCOUNTER — Encounter (INDEPENDENT_AMBULATORY_CARE_PROVIDER_SITE_OTHER): Payer: Medicare Other | Admitting: Ophthalmology

## 2023-05-19 ENCOUNTER — Encounter: Payer: Medicare Other | Admitting: Family Medicine
